# Patient Record
Sex: Male | Born: 1955 | Race: White | Hispanic: No | Smoking: Current every day smoker
Health system: Southern US, Community
[De-identification: ages and names within clinical notes are randomized; demographics above are authoritative.]

## PROBLEM LIST (undated history)

## (undated) DIAGNOSIS — M869 Osteomyelitis, unspecified: Secondary | ICD-10-CM

## (undated) DIAGNOSIS — I2510 Atherosclerotic heart disease of native coronary artery without angina pectoris: Secondary | ICD-10-CM

## (undated) DIAGNOSIS — I10 Essential (primary) hypertension: Secondary | ICD-10-CM

## (undated) DIAGNOSIS — K219 Gastro-esophageal reflux disease without esophagitis: Secondary | ICD-10-CM

## (undated) DIAGNOSIS — E119 Type 2 diabetes mellitus without complications: Secondary | ICD-10-CM

## (undated) DIAGNOSIS — E785 Hyperlipidemia, unspecified: Secondary | ICD-10-CM

## (undated) HISTORY — PX: OTHER SURGICAL HISTORY: SHX169

## (undated) HISTORY — PX: CARDIAC CATHETERIZATION: SHX172

## (undated) HISTORY — PX: BELOW KNEE LEG AMPUTATION: SUR23

## (undated) HISTORY — PX: TONSILLECTOMY: SUR1361

## (undated) HISTORY — PX: APPENDECTOMY: SHX54

---

## 2003-11-27 HISTORY — PX: CORONARY STENT INTERVENTION: CATH118234

## 2015-08-27 DEATH — deceased

## 2015-12-28 ENCOUNTER — Inpatient Hospital Stay (HOSPITAL_COMMUNITY): Payer: Medicaid Other

## 2015-12-28 ENCOUNTER — Encounter (HOSPITAL_COMMUNITY): Payer: Self-pay | Admitting: Emergency Medicine

## 2015-12-28 ENCOUNTER — Inpatient Hospital Stay (HOSPITAL_COMMUNITY)
Admission: EM | Admit: 2015-12-28 | Discharge: 2015-12-31 | DRG: 240 | Disposition: A | Discharge status: Home / Self Care | Payer: Medicaid Other | Attending: Family Medicine | Admitting: Family Medicine

## 2015-12-28 DIAGNOSIS — T148XXA Other injury of unspecified body region, initial encounter: Secondary | ICD-10-CM

## 2015-12-28 DIAGNOSIS — M869 Osteomyelitis, unspecified: Secondary | ICD-10-CM | POA: Diagnosis present

## 2015-12-28 DIAGNOSIS — Z95820 Peripheral vascular angioplasty status with implants and grafts: Secondary | ICD-10-CM

## 2015-12-28 DIAGNOSIS — E785 Hyperlipidemia, unspecified: Secondary | ICD-10-CM | POA: Diagnosis present

## 2015-12-28 DIAGNOSIS — Z86718 Personal history of other venous thrombosis and embolism: Secondary | ICD-10-CM | POA: Diagnosis not present

## 2015-12-28 DIAGNOSIS — T8149XA Infection following a procedure, other surgical site, initial encounter: Secondary | ICD-10-CM | POA: Insufficient documentation

## 2015-12-28 DIAGNOSIS — E11621 Type 2 diabetes mellitus with foot ulcer: Secondary | ICD-10-CM | POA: Diagnosis present

## 2015-12-28 DIAGNOSIS — L0390 Cellulitis, unspecified: Secondary | ICD-10-CM

## 2015-12-28 DIAGNOSIS — F17210 Nicotine dependence, cigarettes, uncomplicated: Secondary | ICD-10-CM | POA: Diagnosis present

## 2015-12-28 DIAGNOSIS — T148 Other injury of unspecified body region: Secondary | ICD-10-CM | POA: Diagnosis not present

## 2015-12-28 DIAGNOSIS — L02612 Cutaneous abscess of left foot: Secondary | ICD-10-CM | POA: Diagnosis present

## 2015-12-28 DIAGNOSIS — L03116 Cellulitis of left lower limb: Secondary | ICD-10-CM | POA: Diagnosis present

## 2015-12-28 DIAGNOSIS — Z89411 Acquired absence of right great toe: Secondary | ICD-10-CM

## 2015-12-28 DIAGNOSIS — E1165 Type 2 diabetes mellitus with hyperglycemia: Secondary | ICD-10-CM | POA: Diagnosis present

## 2015-12-28 DIAGNOSIS — Z880 Allergy status to penicillin: Secondary | ICD-10-CM

## 2015-12-28 DIAGNOSIS — L089 Local infection of the skin and subcutaneous tissue, unspecified: Secondary | ICD-10-CM

## 2015-12-28 DIAGNOSIS — R609 Edema, unspecified: Secondary | ICD-10-CM | POA: Diagnosis not present

## 2015-12-28 DIAGNOSIS — L97524 Non-pressure chronic ulcer of other part of left foot with necrosis of bone: Secondary | ICD-10-CM | POA: Diagnosis present

## 2015-12-28 DIAGNOSIS — I10 Essential (primary) hypertension: Secondary | ICD-10-CM | POA: Diagnosis present

## 2015-12-28 DIAGNOSIS — L0291 Cutaneous abscess, unspecified: Secondary | ICD-10-CM

## 2015-12-28 DIAGNOSIS — E1169 Type 2 diabetes mellitus with other specified complication: Secondary | ICD-10-CM | POA: Diagnosis present

## 2015-12-28 DIAGNOSIS — E871 Hypo-osmolality and hyponatremia: Secondary | ICD-10-CM | POA: Diagnosis present

## 2015-12-28 DIAGNOSIS — Z794 Long term (current) use of insulin: Secondary | ICD-10-CM | POA: Diagnosis not present

## 2015-12-28 DIAGNOSIS — E1152 Type 2 diabetes mellitus with diabetic peripheral angiopathy with gangrene: Principal | ICD-10-CM | POA: Diagnosis present

## 2015-12-28 DIAGNOSIS — L97529 Non-pressure chronic ulcer of other part of left foot with unspecified severity: Secondary | ICD-10-CM | POA: Diagnosis present

## 2015-12-28 HISTORY — DX: Type 2 diabetes mellitus without complications: E11.9

## 2015-12-28 LAB — I-STAT CG4 LACTIC ACID, ED: Lactic Acid, Venous: 1.26 mmol/L (ref 0.5–2.0)

## 2015-12-28 LAB — CBC WITH DIFFERENTIAL/PLATELET
BASOS ABS: 0.1 10*3/uL (ref 0.0–0.1)
BASOS PCT: 0 %
EOS ABS: 0 10*3/uL (ref 0.0–0.7)
EOS PCT: 0 %
HCT: 42.3 % (ref 39.0–52.0)
Hemoglobin: 14.5 g/dL (ref 13.0–17.0)
Lymphocytes Relative: 11 %
Lymphs Abs: 1.9 10*3/uL (ref 0.7–4.0)
MCH: 29.4 pg (ref 26.0–34.0)
MCHC: 34.3 g/dL (ref 30.0–36.0)
MCV: 85.8 fL (ref 78.0–100.0)
MONO ABS: 1.4 10*3/uL — AB (ref 0.1–1.0)
Monocytes Relative: 8 %
Neutro Abs: 14.3 10*3/uL — ABNORMAL HIGH (ref 1.7–7.7)
Neutrophils Relative %: 81 %
PLATELETS: 194 10*3/uL (ref 150–400)
RBC: 4.93 MIL/uL (ref 4.22–5.81)
RDW: 13.4 % (ref 11.5–15.5)
WBC: 17.7 10*3/uL — AB (ref 4.0–10.5)

## 2015-12-28 LAB — BASIC METABOLIC PANEL
ANION GAP: 11 (ref 5–15)
BUN: 14 mg/dL (ref 6–20)
CALCIUM: 8.7 mg/dL — AB (ref 8.9–10.3)
CO2: 27 mmol/L (ref 22–32)
Chloride: 91 mmol/L — ABNORMAL LOW (ref 101–111)
Creatinine, Ser: 0.86 mg/dL (ref 0.61–1.24)
GLUCOSE: 182 mg/dL — AB (ref 65–99)
Potassium: 4.7 mmol/L (ref 3.5–5.1)
SODIUM: 129 mmol/L — AB (ref 135–145)

## 2015-12-28 LAB — GLUCOSE, CAPILLARY: Glucose-Capillary: 173 mg/dL — ABNORMAL HIGH (ref 65–99)

## 2015-12-28 LAB — SEDIMENTATION RATE: SED RATE: 85 mm/h — AB (ref 0–16)

## 2015-12-28 LAB — OSMOLALITY: Osmolality: 279 mOsm/kg (ref 275–295)

## 2015-12-28 LAB — C-REACTIVE PROTEIN: CRP: 27.2 mg/dL — ABNORMAL HIGH (ref <1.0)

## 2015-12-28 MED ORDER — LISINOPRIL 5 MG PO TABS
5.0000 mg | ORAL_TABLET | Freq: Two times a day (BID) | ORAL | Status: DC
  Administered 2015-12-28 – 2015-12-31 (×5): 5 mg via ORAL
  Filled 2015-12-28 (×5): qty 1

## 2015-12-28 MED ORDER — HYDROCODONE-ACETAMINOPHEN 10-325 MG PO TABS
1.0000 | ORAL_TABLET | Freq: Four times a day (QID) | ORAL | Status: DC | PRN
  Administered 2015-12-28 – 2015-12-30 (×7): 1 via ORAL
  Filled 2015-12-28 (×7): qty 1

## 2015-12-28 MED ORDER — VANCOMYCIN HCL IN DEXTROSE 1-5 GM/200ML-% IV SOLN
1000.0000 mg | Freq: Three times a day (TID) | INTRAVENOUS | Status: DC
  Administered 2015-12-28 – 2015-12-31 (×8): 1000 mg via INTRAVENOUS
  Filled 2015-12-28 (×10): qty 200

## 2015-12-28 MED ORDER — PREGABALIN 100 MG PO CAPS
100.0000 mg | ORAL_CAPSULE | Freq: Three times a day (TID) | ORAL | Status: DC
  Administered 2015-12-28 – 2015-12-31 (×8): 100 mg via ORAL
  Filled 2015-12-28 (×8): qty 1

## 2015-12-28 MED ORDER — HEPARIN SODIUM (PORCINE) 5000 UNIT/ML IJ SOLN
5000.0000 [IU] | Freq: Three times a day (TID) | INTRAMUSCULAR | Status: DC
  Administered 2015-12-28 – 2015-12-31 (×6): 5000 [IU] via SUBCUTANEOUS
  Filled 2015-12-28 (×6): qty 1

## 2015-12-28 MED ORDER — INSULIN ASPART 100 UNIT/ML ~~LOC~~ SOLN
0.0000 [IU] | Freq: Three times a day (TID) | SUBCUTANEOUS | Status: DC
  Administered 2015-12-29: 3 [IU] via SUBCUTANEOUS
  Administered 2015-12-29: 2 [IU] via SUBCUTANEOUS
  Administered 2015-12-29: 3 [IU] via SUBCUTANEOUS
  Administered 2015-12-30: 2 [IU] via SUBCUTANEOUS
  Administered 2015-12-30: 3 [IU] via SUBCUTANEOUS

## 2015-12-28 MED ORDER — NICOTINE 14 MG/24HR TD PT24
14.0000 mg | MEDICATED_PATCH | Freq: Every day | TRANSDERMAL | Status: DC
  Administered 2015-12-28 – 2015-12-30 (×3): 14 mg via TRANSDERMAL
  Filled 2015-12-28 (×4): qty 1

## 2015-12-28 MED ORDER — PANTOPRAZOLE SODIUM 40 MG PO TBEC
40.0000 mg | DELAYED_RELEASE_TABLET | Freq: Every day | ORAL | Status: DC
  Administered 2015-12-29 – 2015-12-31 (×3): 40 mg via ORAL
  Filled 2015-12-28 (×3): qty 1

## 2015-12-28 MED ORDER — ATORVASTATIN CALCIUM 40 MG PO TABS
40.0000 mg | ORAL_TABLET | Freq: Every day | ORAL | Status: DC
Start: 2015-12-29 — End: 2015-12-31
  Administered 2015-12-29 – 2015-12-31 (×3): 40 mg via ORAL
  Filled 2015-12-28 (×3): qty 1

## 2015-12-28 MED ORDER — HYDROCODONE-ACETAMINOPHEN 5-325 MG PO TABS
1.0000 | ORAL_TABLET | Freq: Four times a day (QID) | ORAL | Status: DC | PRN
Start: 2015-12-28 — End: 2015-12-28

## 2015-12-28 MED ORDER — SODIUM CHLORIDE 0.9 % IV SOLN
INTRAVENOUS | Status: AC
  Administered 2015-12-28: 23:00:00 via INTRAVENOUS
  Filled 2015-12-28: qty 1000

## 2015-12-28 MED ORDER — CARVEDILOL 6.25 MG PO TABS
6.2500 mg | ORAL_TABLET | Freq: Two times a day (BID) | ORAL | Status: DC
  Administered 2015-12-29 – 2015-12-31 (×5): 6.25 mg via ORAL
  Filled 2015-12-28 (×5): qty 1

## 2015-12-28 MED ORDER — CLOPIDOGREL BISULFATE 75 MG PO TABS: 75.0000 mg | ORAL_TABLET | Freq: Every day | ORAL | Status: DC

## 2015-12-28 MED ORDER — VANCOMYCIN HCL IN DEXTROSE 1-5 GM/200ML-% IV SOLN: 1000.0000 mg | Freq: Once | INTRAVENOUS | Status: DC

## 2015-12-28 MED ORDER — GADOBENATE DIMEGLUMINE 529 MG/ML IV SOLN
20.0000 mL | Freq: Once | INTRAVENOUS | Status: AC | PRN
  Administered 2015-12-28: 20 mL via INTRAVENOUS

## 2015-12-28 MED ORDER — INSULIN GLARGINE 100 UNIT/ML ~~LOC~~ SOLN
15.0000 [IU] | Freq: Every day | SUBCUTANEOUS | Status: DC
Start: 2015-12-28 — End: 2015-12-31
  Administered 2015-12-28 – 2015-12-30 (×3): 15 [IU] via SUBCUTANEOUS
  Filled 2015-12-28 (×4): qty 0.15

## 2015-12-28 NOTE — ED Notes (Addendum)
Pt taken to MRI and then to be transported to [facility].

## 2015-12-28 NOTE — Consult Note (Signed)
Pharmacy Antibiotic Note  Antonio [name]. is a 60 y.o. male admitted on 12/28/2015 with cellulitis.  Pharmacy has been consulted for vancomycin dosing.  Was started on Doxycycline on 1/27 without improvement. Seen at [facility] today for debridement of foot ulcer.  MRI with osteomyelitis in proximal phalanx of third toe of left foot.  Plan: Vanc IV 1g x1 F/u weight to enter maintenance dose Monitor renal function, cultures, VT prn, clinical progression    Temp (24hrs), Avg:100.2 F (37.9 C), Min:100.2 F (37.9 C), Max:100.2 F (37.9 C)   Recent Labs Lab 12/28/15 1253 12/28/15 1647 12/28/15 1654  WBC 17.7*  --   --   CREATININE  --  0.86  --   LATICACIDVEN  --   --  1.26    CrCl cannot be calculated (Unknown ideal weight.).    Allergies  Allergen Reactions  . Penicillins Other (See Comments)    Antimicrobials this admission: Vanc 2/1 >>   Dose adjustments this admission: n/a  Microbiology results: 2/1 BCx: sent  Thank you for allowing pharmacy to be a part of this patient's care.  Greggory Stallion, PharmD Clinical Pharmacy Resident Pager # [phone] 12/28/2015 9:58 PM

## 2015-12-28 NOTE — Consult Note (Signed)
Pharmacy Antibiotic Note  Antonio [name]. is a 60 y.o. male admitted on 12/28/2015 with cellulitis.  Pharmacy has been consulted for vancomycin dosing.  Was started on Doxycycline on 1/27 without improvement. Seen at [facility] today for debridement of foot ulcer.  MRI with osteomyelitis in proximal phalanx of third toe of left foot.  Plan: Vanc 1g IV q8h Monitor renal function, cultures, VT prn, clinical progression  Height: 6' (182.9 cm) Weight: 219 lb (99.338 kg) IBW/kg (Calculated) : 77.6  Temp (24hrs), Avg:100.3 F (37.9 C), Min:100.2 F (37.9 C), Max:100.4 F (38 C)   Recent Labs Lab 12/28/15 1253 12/28/15 1647 12/28/15 1654  WBC 17.7*  --   --   CREATININE  --  0.86  --   LATICACIDVEN  --   --  1.26    Estimated Creatinine Clearance: 115.7 mL/min (by C-G formula based on Cr of 0.86).    Allergies  Allergen Reactions  . Penicillins Other (See Comments)    Antimicrobials this admission: Vanc 2/1 >>   Dose adjustments this admission: n/a  Microbiology results: 2/1 BCx: sent  Arlean Hopping. Newman Pies, PharmD, BCPS Clinical Pharmacist Pager [phone]  12/28/2015 10:15 PM

## 2015-12-28 NOTE — ED Notes (Signed)
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 Pt alert x4. NAD at this time.

## 2015-12-28 NOTE — H&P (Signed)
[facility] Admission History and Physical Service Pager: [phone]  Patient name: Antonio [name]. Medical record number: [id number] Date of birth: [birth year] Age: 60 y.o. Gender: male  Primary Care Provider: No PCP Per Patient Consultants: WOC Code Status: Full  Chief Complaint: Cellulitis  Assessment and Plan: Antonio [name]. is a 60 y.o. male presenting with left lower extremity cellulitis with concern for possible osteomyelitis. PMH is significant for Diabetes  # Cellulitis/Osteomyelitis: Was started on Doxycycline on 1/27 without improvement. Lactic Acid 1.26 at admission. WBC 17.7. Afebrile with stable vitals. qSOFA score of 0. MRI with osteomyelitis in proximal phalanx of third toe of left foot with hypoperfusion of soft tissues along ball of foot into third, fourth, and fifth toes concerning for necrosis. - Admit to [place], Gwendlyn Deutscher attending - [place] consult - Follow up blood cultures - Follow up CRP - Orthopedics consulted. Appreciate recommendations.  - Vancomycin per Pharmacy (2/1>>)  # Hyponatremia: Sodium 129 at admission. Corrects to 130 with hyperglycemia - Follow up serum osmolality, urine osmolality, urine sodium - NS'@100'  x12hr - Continue to monitor. Follow up BMP in am  # Diabetes: Home medications include Glipizide 54m and Lantus 30 units at bedtime - Follow up A1C - Lantus 15units. Titrate up as indicated during hospitalization - Sensitive Insulin Sliding Scale  # Hypertension: Home medications include Coreg 6.272mBID - Continue home medication.  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 Home medications include Plavix 7580m US [place] extremity - Holding Plavix due to possible surgery - Heparin SQ  # Tobacco Abuse: Smokes 2ppd - Nicotine Patch - Smoking cessation counseling during hospitalization  # Hyperlipidemia: Home medication includes Atorvastatin 66m69mContinue home  medication  FEN/GI: Protonix. NS '@100x12hr' . Carb Modified Diet (Will need to be NPO at midnight prior to surgery) Prophylaxis: SQ Heparin  Disposition: Pending improvement with antibiotics and possibly surgery  History of Present Illness:  Antonio [name] a 59 y75. male presenting with left lower extremity cellulitis. Initially presented to Wound Care, who noted significant erythema, warmth, and swelling of left foot with chronic ulcers. Stepped on nail 6 months ago, resulting in abscess which was drained and in process of healing. One week ago, area acutely worsened and abscess returned. Debrided by wound care on 1/27 and initiated on Doxycycline. Redness, swelling, edema, and pain has continued to worsen. Reports chills. Denies nausea, vomiting, diarrhea, rashes elsewhere. Reports history of Diabetes, Hypertension, Hyperlipidemia, Tobacco Abuse, and DVT (although knew very little about his history of DVT).  Review Of Systems: Per HPI  Otherwise the remainder of the systems were negative.  Patient Active Problem List   Diagnosis Date Noted  . Cellulitis 12/28/2015    Past Medical History: Past Medical History  Diagnosis Date  . [facility]  Past Surgical History: Past Surgical History  Procedure Laterality Date  . Cardiac catheterization    . Great toe amputation right.      Social History: Social History  Substance Use Topics  . Smoking status: Current Every Day Smoker -- 2.00 packs/day    Types: Cigarettes  . Smokeless tobacco: None  . Alcohol Use: No   Please also refer to relevant sections of EMR.  Family History: Family History Not Contributory to Patient Problem  Allergies and Medications: Allergies  Allergen Reactions  . Penicillins Other (See Comments)   No current facility-administered medications on file prior to encounter.   No  current outpatient prescriptions on file prior to encounter.    Objective: BP  107/74 mmHg  Pulse 85  Temp(Src) 100.2 F (37.9 C) (Oral)  Resp 16  SpO2 92% Exam: General: 60yo male resting comfortably in no apparent distress Eyes: PERRLA, white sclera ENTM: Dry mucous membranes Neck: Supple, No lymphadenopathy Cardiovascular: S1 and S2 noted. No murmurs. Regular rate and rhythm. Respiratory: Clear to auscultation bilaterally. No wheezes. No increased work of breathing. Abdomen: Bowel sounds noted. Nontender. Nondistended. MSK: Edema of left lower extremity noted. Limited ROM of left ankle due to edema and pain. Skin: Erythema and warmth noted of left lower extremity with two ulcers noted on base of foot and one ulcer noted between first and second digit. Discoloration of 4th digit noted. Refer to pictures below. Neuro: No focal deficits. Psych: Normal Affect. Alert.      Labs and Imaging: CBC BMET   Recent Labs Lab 12/28/15 1253  WBC 17.7*  HGB 14.5  HCT 42.3  PLT 194    Recent Labs Lab 12/28/15 1647  NA 129*  K 4.7  CL 91*  CO2 27  BUN 14  CREATININE 0.86  GLUCOSE 182*  CALCIUM 8.7*    - Lactic Acid 1.26 - ESR 85  Mr Foot Left W Wo Contrast  12/28/2015  CLINICAL DATA:  Chronic left foot wound, stepped on a nail 6 months ago, had an abscess which drained and initially appear to heal well but 1 week ago the area worsened and abscess return. Wound care debridement on 12/22/2014. Foot found to be very erythematous and swollen today at followup. Diabetes. EXAM: MRI OF THE LEFT FOREFOOT WITHOUT AND WITH CONTRAST TECHNIQUE: Multiplanar, multisequence MR imaging was performed both before and after administration of intravenous contrast. CONTRAST:  7m MULTIHANCE GADOBENATE DIMEGLUMINE 529 MG/ML IV SOLN COMPARISON:  12/22/2015 FINDINGS: Osteomyelitis protocol MRI of the foot was obtained, to include the entire foot and ankle. This protocol uses a large field of view to cover the entire foot and ankle, and is suitable for assessing bony structures for  osteomyelitis. Due to the large field of view and imaging plane choice, this protocol is less sensitive for assessing small structures such as ligamentous structures of the foot and ankle, compared to a dedicated forefoot or dedicated hindfoot exam. Although inversion recovery weighted images raise the possibility of some edema signal in the distal phalanx and proximal phalanx of the great toe, lack of low T1 signal in these bones suggests that this is probably due to field heterogeneity rather than osteomyelitis. There is potentially some low-level osteomyelitis, however, in the proximal phalanx of the third toe where there is some reduced T1 signal and also increased T2 signal, but only questionable enhancement. There is abnormal hypoenhancement in the soft tissues along the ball of the foot extending from the second MTP joint all the way over to the fifth toe, and tracking somewhat into the third, fourth, and fifth toes as shown on images 13 through 2 of series 6. Appearance concerning for tissue necrosis given the hypoperfusion. A small amount of this extends over to the base of the great toe medially. Although suspicious for potentially necrotic tissue, we do not demonstrate a drainable abscess. Subcutaneous edema and enhancement tracking along the dorsum of the foot compatible cellulitis. Suspected blistering tracking around the dorsum of the fourth toe proximally. Lisfranc ligament intact. Midfoot degenerative arthropathy. Abnormal edema and enhancement tracks in the plantar musculature of the foot. Thickened medial band of the plantar fascia.  IMPRESSION: 1. Suspected osteomyelitis in the proximal phalanx of the third toe, with extensive hypoperfusion of soft tissues tracking along the ball of the foot laterally and into the third, fourth, and fifth toes, concerning for necrosis. There is some questionable edema in some of the other phalanges but not borne out with low T1 signal on the T1 weighted images. 2.  No drainable abscess. 3. Plantar fasciitis. 4. Cellulitis in the dorsum of the foot with probable myositis tracking along the plantar musculature of the foot. Electronically Signed   By: Van Clines M.D.   On: 12/28/2015 20:25    Lorna Few, DO 12/28/2015, 5:43 PM PGY-2, [place] pager: [phone], text pages welcome

## 2015-12-28 NOTE — ED Provider Notes (Signed)
CSN: [id number]     Arrival date & time 12/28/15  1151 History   First MD Initiated Contact with Patient 12/28/15 1538     Chief Complaint  Patient presents with  . Foot Ulcer     (Consider location/radiation/quality/duration/timing/severity/associated sxs/prior Treatment) The history is provided by the patient and medical records. No language interpreter was used.    Antonio [name]. is a 60 y.o. male  with a PMH of DM, chronic left foot wound who presents to the Emergency Department sent from wound care for further evaluation of left foot. Pt. States he stepped on a nail 6 months ago - had an abscess which was drained and area was healing up well. Approx. 1 week ago, area acutely worsened and abscess returned. He was seen by wound care for debridement on 1/27 and started on doxycyline. Went to follow up appointment today where foot was found to be extremely erythematous and swollen.   Past Medical History  Diagnosis Date  . Diabetes mellitus without complication [facility])    Past Surgical History  Procedure Laterality Date  . Cardiac catheterization    . Great toe amputation right.     No family history on file. Social History  Substance Use Topics  . Smoking status: Current Every Day Smoker -- 2.00 packs/day    Types: Cigarettes  . Smokeless tobacco: None  . Alcohol Use: No    Review of Systems  Constitutional: Negative for fever and chills.  HENT: Negative for congestion.   Eyes: Negative for visual disturbance.  Respiratory: Negative for cough, shortness of breath and wheezing.   Cardiovascular: Negative for chest pain and palpitations.  Gastrointestinal: Negative for nausea, vomiting and abdominal pain.  Genitourinary: Negative for dysuria.  Musculoskeletal: Negative for back pain and neck pain.  Skin: Positive for wound.  Neurological: Negative for dizziness, weakness and headaches.      Allergies  Penicillins  Home Medications   Prior to Admission  medications   Medication Sig Start Date End Date Taking? Authorizing Provider  pregabalin (LYRICA) 100 MG capsule Take 100 mg by mouth 3 (three) times daily.   Yes Historical Provider, MD   BP 107/74 mmHg  Pulse 85  Temp(Src) 100.2 F (37.9 C) (Oral)  Resp 16  SpO2 92% Physical Exam  Constitutional: He is oriented to person, place, and time. He appears well-developed and well-nourished.  Alert and in no acute distress  HENT:  Head: Normocephalic and atraumatic.  Cardiovascular: Normal rate, regular rhythm, normal heart sounds and intact distal pulses.  Exam reveals no gallop and no friction rub.   No murmur heard. Pulmonary/Chest: Effort normal and breath sounds normal. No respiratory distress. He has no wheezes. He has no rales. He exhibits no tenderness.  Abdominal: Soft. Bowel sounds are normal. He exhibits no distension and no mass. There is no tenderness. There is no rebound and no guarding.  Musculoskeletal: He exhibits no edema.  Neurological: He is alert and oriented to person, place, and time.  Skin: Skin is warm and dry.  Two open wounds on plantar surface of left foot.  Foot TTP, erythematous and swollen up to ankle Pulse present. Motor and sensation intact.   Psychiatric: He has a normal mood and affect. His behavior is normal. Judgment and thought content normal.  Nursing note and vitals reviewed.   ED Course  Procedures (including critical care time) Labs Review Labs Reviewed  CBC WITH DIFFERENTIAL/PLATELET - Abnormal; Notable for the following:    WBC 17.7 (*)  Neutro Abs 14.3 (*)    Monocytes Absolute 1.4 (*)    All other components within normal limits  BASIC METABOLIC PANEL - Abnormal; Notable for the following:    Sodium 129 (*)    Chloride 91 (*)    Glucose, Bld 182 (*)    Calcium 8.7 (*)    All other components within normal limits  CULTURE, BLOOD (ROUTINE X 2)  CULTURE, BLOOD (ROUTINE X 2)  SEDIMENTATION RATE  I-STAT CG4 LACTIC ACID, ED     Imaging Review No results found. I have personally reviewed and evaluated these images and lab results as part of my medical decision-making.   EKG Interpretation None      MDM   Final diagnoses:  Wound infection [facility])   Antonio [name]. presents, sent from wound care, for worsening left foot infection. Debridement on 1/27 and started on doxy at that time. On exam, foot is erythematous and swollen - c/w cellulitis. White count of 17.7, temp of 100.2 -- will send blood cx's. Concern for osteo -- will obtain MRI, sed rate.   A&P: Cellulitis  - Consult to family medicine who will admit patient for further evaluation and management. MRI and sed rate pending at time of admission.   Patient discussed with Dr. Preston Fleeting who agrees with treatment plan.   [facility] Naomi Fitton, PA-C 12/28/15 1749  Dione Booze, MD 12/29/15 [place] Manners

## 2015-12-29 ENCOUNTER — Ambulatory Visit (HOSPITAL_COMMUNITY): Payer: Medicaid Other

## 2015-12-29 ENCOUNTER — Other Ambulatory Visit (HOSPITAL_COMMUNITY): Payer: Self-pay | Admitting: Orthopedic Surgery

## 2015-12-29 ENCOUNTER — Telehealth: Payer: Self-pay | Admitting: General Practice

## 2015-12-29 DIAGNOSIS — L0390 Cellulitis, unspecified: Secondary | ICD-10-CM

## 2015-12-29 DIAGNOSIS — T8149XA Infection following a procedure, other surgical site, initial encounter: Secondary | ICD-10-CM | POA: Insufficient documentation

## 2015-12-29 DIAGNOSIS — R609 Edema, unspecified: Secondary | ICD-10-CM | POA: Insufficient documentation

## 2015-12-29 DIAGNOSIS — T148XXA Other injury of unspecified body region, initial encounter: Secondary | ICD-10-CM

## 2015-12-29 DIAGNOSIS — L089 Local infection of the skin and subcutaneous tissue, unspecified: Secondary | ICD-10-CM | POA: Insufficient documentation

## 2015-12-29 DIAGNOSIS — L0291 Cutaneous abscess, unspecified: Secondary | ICD-10-CM | POA: Insufficient documentation

## 2015-12-29 LAB — CBC
HEMATOCRIT: 37.4 % — AB (ref 39.0–52.0)
HEMOGLOBIN: 12.7 g/dL — AB (ref 13.0–17.0)
MCH: 29 pg (ref 26.0–34.0)
MCHC: 34 g/dL (ref 30.0–36.0)
MCV: 85.4 fL (ref 78.0–100.0)
Platelets: 171 10*3/uL (ref 150–400)
RBC: 4.38 MIL/uL (ref 4.22–5.81)
RDW: 13.4 % (ref 11.5–15.5)
WBC: 13.5 10*3/uL — ABNORMAL HIGH (ref 4.0–10.5)

## 2015-12-29 LAB — GLUCOSE, CAPILLARY
GLUCOSE-CAPILLARY: 181 mg/dL — AB (ref 65–99)
GLUCOSE-CAPILLARY: 233 mg/dL — AB (ref 65–99)
Glucose-Capillary: 217 mg/dL — ABNORMAL HIGH (ref 65–99)
Glucose-Capillary: 226 mg/dL — ABNORMAL HIGH (ref 65–99)
Glucose-Capillary: 233 mg/dL — ABNORMAL HIGH (ref 65–99)

## 2015-12-29 LAB — BASIC METABOLIC PANEL
ANION GAP: 10 (ref 5–15)
BUN: 10 mg/dL (ref 6–20)
CHLORIDE: 94 mmol/L — AB (ref 101–111)
CO2: 26 mmol/L (ref 22–32)
Calcium: 8.4 mg/dL — ABNORMAL LOW (ref 8.9–10.3)
Creatinine, Ser: 0.73 mg/dL (ref 0.61–1.24)
GFR calc Af Amer: >60 mL/min (ref >60)
GFR calc non Af Amer: >60 mL/min (ref >60)
GLUCOSE: 179 mg/dL — AB (ref 65–99)
POTASSIUM: 4.4 mmol/L (ref 3.5–5.1)
Sodium: 130 mmol/L — ABNORMAL LOW (ref 135–145)

## 2015-12-29 LAB — OSMOLALITY, URINE: Osmolality, Ur: 753 mOsm/kg (ref 300–900)

## 2015-12-29 LAB — SODIUM, URINE, RANDOM: SODIUM UR: 18 mmol/L

## 2015-12-29 NOTE — Telephone Encounter (Signed)
Called brittany back and advised her to page the inpatient since patient is still in the hospital.  [facility]

## 2015-12-29 NOTE — Consult Note (Signed)
Reason for Consult: Abscess cellulitis left foot Referring Physician: Dr. Verlin Fester [facility]. is an 60 y.o. male.  HPI: Patient is a 60 year old gentleman who reports uncontrolled diabetes who is status post revascularization for both lower extremities with stenting on the left angioplasty on the right. Patient recently underwent debridement of 2 foot ulcers and he states the cellulitis developed after debridement of the ulcers left foot.  Past Medical History  Diagnosis Date  . Diabetes mellitus without complication [facility])     Past Surgical History  Procedure Laterality Date  . Cardiac catheterization    . Great toe amputation right.      No family history on file.  Social History:  reports that he has been smoking Cigarettes.  He has been smoking about 2.00 packs per day. He does not have any smokeless tobacco history on file. He reports that he does not drink alcohol or use illicit drugs.  Allergies:  Allergies  Allergen Reactions  . Penicillins Other (See Comments)    Medications: I have reviewed the patient's current medications.  Results for orders placed or performed during the hospital encounter of 12/28/15 (from the past 48 hour(s))  CBC with Differential     Status: Abnormal   Collection Time: 12/28/15 12:53 PM  Result Value Ref Range   WBC 17.7 (H) 4.0 - 10.5 K/uL   RBC 4.93 4.22 - 5.81 MIL/uL   Hemoglobin 14.5 13.0 - 17.0 g/dL   HCT 42.3 39.0 - 52.0 %   MCV 85.8 78.0 - 100.0 fL   MCH 29.4 26.0 - 34.0 pg   MCHC 34.3 30.0 - 36.0 g/dL   RDW 13.4 11.5 - 15.5 %   Platelets 194 150 - 400 K/uL   Neutrophils Relative % 81 %   Neutro Abs 14.3 (H) 1.7 - 7.7 K/uL   Lymphocytes Relative 11 %   Lymphs Abs 1.9 0.7 - 4.0 K/uL   Monocytes Relative 8 %   Monocytes Absolute 1.4 (H) 0.1 - 1.0 K/uL   Eosinophils Relative 0 %   Eosinophils Absolute 0.0 0.0 - 0.7 K/uL   Basophils Relative 0 %   Basophils Absolute 0.1 0.0 - 0.1 K/uL  Basic metabolic panel      Status: Abnormal   Collection Time: 12/28/15  4:47 PM  Result Value Ref Range   Sodium 129 (L) 135 - 145 mmol/L   Potassium 4.7 3.5 - 5.1 mmol/L    Comment: SLIGHT HEMOLYSIS   Chloride 91 (L) 101 - 111 mmol/L   CO2 27 22 - 32 mmol/L   Glucose, Bld 182 (H) 65 - 99 mg/dL   BUN 14 6 - 20 mg/dL   Creatinine, Ser 0.86 0.61 - 1.24 mg/dL   Calcium 8.7 (L) 8.9 - 10.3 mg/dL   GFR calc non Af Amer >60 >60 mL/min   GFR calc Af Amer >60 >60 mL/min    Comment: (NOTE) The eGFR has been calculated using the CKD EPI equation. This calculation has not been validated in all clinical situations. eGFR's persistently <60 mL/min signify possible Chronic Kidney Disease.    Anion gap 11 5 - 15  Sedimentation rate     Status: Abnormal   Collection Time: 12/28/15  4:47 PM  Result Value Ref Range   Sed Rate 85 (H) 0 - 16 mm/hr  I-Stat CG4 Lactic Acid, ED     Status: None   Collection Time: 12/28/15  4:54 PM  Result Value Ref Range   Lactic Acid, Venous  1.26 0.5 - 2.0 mmol/L  C-reactive protein     Status: Abnormal   Collection Time: 12/28/15  8:50 PM  Result Value Ref Range   CRP 27.2 (H) <1.0 mg/dL  Osmolality     Status: None   Collection Time: 12/28/15  8:50 PM  Result Value Ref Range   Osmolality 279 275 - 295 mOsm/kg  Glucose, capillary     Status: Abnormal   Collection Time: 12/28/15 10:41 PM  Result Value Ref Range   Glucose-Capillary 173 (H) 65 - 99 mg/dL  Osmolality, urine     Status: None   Collection Time: 12/29/15  3:34 AM  Result Value Ref Range   Osmolality, Ur 753 300 - 900 mOsm/kg  Sodium, urine, random     Status: None   Collection Time: 12/29/15  3:34 AM  Result Value Ref Range   Sodium, Ur 18 mmol/L    Mr Foot Left W Wo Contrast  12/28/2015  CLINICAL DATA:  Chronic left foot wound, stepped on a nail 6 months ago, had an abscess which drained and initially appear to heal well but 1 week ago the area worsened and abscess return. Wound care debridement on 12/22/2014. Foot  found to be very erythematous and swollen today at followup. Diabetes. EXAM: MRI OF THE LEFT FOREFOOT WITHOUT AND WITH CONTRAST TECHNIQUE: Multiplanar, multisequence MR imaging was performed both before and after administration of intravenous contrast. CONTRAST:  1m MULTIHANCE GADOBENATE DIMEGLUMINE 529 MG/ML IV SOLN COMPARISON:  12/22/2015 FINDINGS: Osteomyelitis protocol MRI of the foot was obtained, to include the entire foot and ankle. This protocol uses a large field of view to cover the entire foot and ankle, and is suitable for assessing bony structures for osteomyelitis. Due to the large field of view and imaging plane choice, this protocol is less sensitive for assessing small structures such as ligamentous structures of the foot and ankle, compared to a dedicated forefoot or dedicated hindfoot exam. Although inversion recovery weighted images raise the possibility of some edema signal in the distal phalanx and proximal phalanx of the great toe, lack of low T1 signal in these bones suggests that this is probably due to field heterogeneity rather than osteomyelitis. There is potentially some low-level osteomyelitis, however, in the proximal phalanx of the third toe where there is some reduced T1 signal and also increased T2 signal, but only questionable enhancement. There is abnormal hypoenhancement in the soft tissues along the ball of the foot extending from the second MTP joint all the way over to the fifth toe, and tracking somewhat into the third, fourth, and fifth toes as shown on images 13 through 2 of series 6. Appearance concerning for tissue necrosis given the hypoperfusion. A small amount of this extends over to the base of the great toe medially. Although suspicious for potentially necrotic tissue, we do not demonstrate a drainable abscess. Subcutaneous edema and enhancement tracking along the dorsum of the foot compatible cellulitis. Suspected blistering tracking around the dorsum of the  fourth toe proximally. Lisfranc ligament intact. Midfoot degenerative arthropathy. Abnormal edema and enhancement tracks in the plantar musculature of the foot. Thickened medial band of the plantar fascia. IMPRESSION: 1. Suspected osteomyelitis in the proximal phalanx of the third toe, with extensive hypoperfusion of soft tissues tracking along the ball of the foot laterally and into the third, fourth, and fifth toes, concerning for necrosis. There is some questionable edema in some of the other phalanges but not borne out with low T1 signal on the  T1 weighted images. 2. No drainable abscess. 3. Plantar fasciitis. 4. Cellulitis in the dorsum of the foot with probable myositis tracking along the plantar musculature of the foot. Electronically Signed   By: Van Clines M.D.   On: 12/28/2015 20:25    Review of Systems  All other systems reviewed and are negative.  Blood pressure 117/68, pulse 88, temperature 100.2 F (37.9 C), temperature source Oral, resp. rate 16, height 6' (1.829 m), weight 99.338 kg (219 lb), SpO2 92 %. Physical Exam On examination patient has good hair growth in both legs. He does not have a palpable dorsalis pedis pulse bilaterally. Patient status post amputation of the right great toe. Left foot shows cellulitis involving almost the entire dorsum of the foot. The forefoot is tender to palpation. There is no purulent drainage from the ulcers. Review of the MRI scan does show what appears to be a large space of nonviable tissue on the plantar forefoot Assessment/Plan: Assessment: Uncontrolled diabetes with peripheral vascular disease cellulitis and a large possible abscess plantar aspect of the left forefoot.  Plan: I will order ankle-brachial indices. Plan for surgical intervention tomorrow with debridement of the plantar area of nonviable tissue left foot  DUDA,MARCUS V 12/29/2015, 6:52 AM

## 2015-12-29 NOTE — Progress Notes (Signed)
VASCULAR LAB PRELIMINARY  ARTERIAL  ABI completed:    RIGHT    LEFT    PRESSURE WAVEFORM  PRESSURE WAVEFORM  BRACHIAL 107 Triphasic BRACHIAL 113 Triphasic  DP 88 Biphasic DP 88 Monophasic  PT 80 Biphasic PT 83 Monophasic    RIGHT LEFT  ABI 0.78 0.78   ABIs indicate a moderate reduction in arterial flow bilaterally at rest  Sonakshi Rolland, RVS 12/29/2015, 12:41 PM

## 2015-12-29 NOTE — Progress Notes (Signed)
VASCULAR LAB PRELIMINARY  PRELIMINARY  PRELIMINARY  PRELIMINARY  Left lower extremity venous duplex completed.    Preliminary report:  Left:  No evidence of DVT, superficial thrombosis, or Baker's cyst.  Cyanne Delmar, RVS 12/29/2015, 12:00 PM

## 2015-12-29 NOTE — Consult Note (Addendum)
WOC consult for left foot requested prior to ortho service involvement. Dr Lajoyce Corners is now following for plan of care and plans to take pt to the OR, according to the EMR.  Please refer to their team for further questions. Please re-consult if further assistance is needed.  Thank-you,  Cammie Mcgee MSN, RN, CWOCN, [place], CNS [phone]

## 2015-12-29 NOTE — Progress Notes (Signed)
RN informed from NT that patient is upset because RN told him that his next pain medication is due at 2300. Patient is only allowed to receive Norco 1 tablet every 6 hours. Last dose received at 1700. Nursing will continue to monitor.

## 2015-12-29 NOTE — Progress Notes (Signed)
Family Medicine Teaching Service Daily Progress Note Intern Pager: [phone]  Patient name: Antonio [name]. Medical record number: [id number] Date of birth: 1956/08/09 Age: 60 y.o. Gender: male  Primary Care Provider: No PCP Per Patient Consultants: Ortho Code Status: Full   Pt Overview and Major Events to Date:  2/1: Admit to FPTS  Assessment and Plan: Antonio [name]. is a 60 y.o. male presenting with left lower extremity cellulitis with concern for possible osteomyelitis. PMH is significant for Diabetes  Cellulitis/Osteomyelitis: Was started on Doxycycline on 1/27 without improvement. Lactic Acid 1.26 at admission. WBC 17.7>13.5. Afebrile with stable vitals. qSOFA score of 0. MRI with osteomyelitis in proximal phalanx of third toe of left foot with hypoperfusion of soft tissues along ball of foot into third, fourth, and fifth toes concerning for necrosis. CRP 27.2 - WOC consult - Follow up blood cultures - Vancomycin per Pharmacy (2/1>>) - NORCO 10-325 mg q6 PRN - Orthopedics consulted, appreciate recommendations  - ABI   - Likely surgery tomorrow; debridement of the plantar area. NPO after midnight  Hyponatremia: Sodium 129 at admission. Corrects to 130 with hyperglycemia - Follow up serum osmolality, urine osmolality, urine sodium - NS@100  x12hr - Continue to monitor. Follow up BMP in am  Diabetes: Home medications include Glipizide  and Lantus 30 units at bedtime - Follow up A1C - Lantus 15units. Titrate up as indicated during hospitalization - Sensitive Insulin Sliding Scale  Hypertension: Home medications include Coreg 6.25mg  BID - Continue home medication.  DVTs: History reported by patient. Home medications include Plavix  - [place] lower extremity - Holding Plavix due to possible surgery - Heparin SQ  Tobacco Abuse: Smokes 2ppd - Nicotine Patch - Smoking cessation counseling during hospitalization  Hyperlipidemia: Home medication includes  Atorvastatin  - Continue home medication  FEN/GI: Protonix. NS . Carb Modified Diet (Will need to be NPO at midnight prior to surgery) Prophylaxis: SQ Heparin  Disposition: Surgery possibly tomorrow  Subjective:  - No acute events overnight but admits to feeling febrile - Patient continues to complain of pain in his left foot; 8/10, improves with pain medication - Good PO intake and good appetite - Denies chest pain, shortness of breath, palpitations, nausea, vomiting, or diarrhea  Objective: Temp:  [100.2 F (37.9 C)-100.4 F (38 C)] 100.2 F (37.9 C) (02/02 0430) Pulse Rate:  [85-89] 88 (02/02 0430) Resp:  [16-22] 16 (02/02 0430) BP: (106-134)/(62-81) 117/68 mmHg (02/02 0430) SpO2:  [92 %-98 %] 92 % (02/02 0430) Weight:  [219 lb (99.338 kg)] 219 lb (99.338 kg) (02/01 2213) Physical Exam: General: In NAD, laying in bed Cardiovascular: S1 and S2 noted. No murmurs. Regular rate and rhythm. Respiratory: Clear to auscultation bilaterally. No wheezes. No increased work of breathing Abdomen: Bowel sounds noted. Nontender. Nondistended MSK: Edema of left lower extremity noted. Limited ROM of left ankle due to edema and pain. Skin: Erythema and warmth noted of left lower extremity with two ulcers noted on base of foot and one ulcer noted between first and second digit. Discoloration of 4th digit noted. Refer to pictures below.      Laboratory:  Recent Labs Lab 12/28/15 1253 12/29/15 0620  WBC 17.7* 13.5*  HGB 14.5 12.7*  HCT 42.3 37.4*  PLT 194 171    Recent Labs Lab 12/28/15 1647 12/29/15 0620  NA 129* 130*  K 4.7 4.4  CL 91* 94*  CO2 27 26  BUN 14 10  CREATININE 0.86 0.73  CALCIUM 8.7* 8.4*  GLUCOSE  182* 179*     Imaging/Diagnostic Tests: Mr Foot Left W Wo Contrast  12/28/2015  CLINICAL DATA:  Chronic left foot wound, stepped on a nail 6 months ago, had an abscess which drained and initially appear to heal well but 1 week ago the area worsened  and abscess return. Wound care debridement on 12/22/2014. Foot found to be very erythematous and swollen today at followup. Diabetes. EXAM: MRI OF THE LEFT FOREFOOT WITHOUT AND WITH CONTRAST TECHNIQUE: Multiplanar, multisequence MR imaging was performed both before and after administration of intravenous contrast. CONTRAST:  20mL MULTIHANCE GADOBENATE DIMEGLUMINE 529 MG/ML IV SOLN COMPARISON:  12/22/2015 FINDINGS: Osteomyelitis protocol MRI of the foot was obtained, to include the entire foot and ankle. This protocol uses a large field of view to cover the entire foot and ankle, and is suitable for assessing bony structures for osteomyelitis. Due to the large field of view and imaging plane choice, this protocol is less sensitive for assessing small structures such as ligamentous structures of the foot and ankle, compared to a dedicated forefoot or dedicated hindfoot exam. Although inversion recovery weighted images raise the possibility of some edema signal in the distal phalanx and proximal phalanx of the great toe, lack of low T1 signal in these bones suggests that this is probably due to field heterogeneity rather than osteomyelitis. There is potentially some low-level osteomyelitis, however, in the proximal phalanx of the third toe where there is some reduced T1 signal and also increased T2 signal, but only questionable enhancement. There is abnormal hypoenhancement in the soft tissues along the ball of the foot extending from the second MTP joint all the way over to the fifth toe, and tracking somewhat into the third, fourth, and fifth toes as shown on images 13 through 2 of series 6. Appearance concerning for tissue necrosis given the hypoperfusion. A small amount of this extends over to the base of the great toe medially. Although suspicious for potentially necrotic tissue, we do not demonstrate a drainable abscess. Subcutaneous edema and enhancement tracking along the dorsum of the foot compatible  cellulitis. Suspected blistering tracking around the dorsum of the fourth toe proximally. Lisfranc ligament intact. Midfoot degenerative arthropathy. Abnormal edema and enhancement tracks in the plantar musculature of the foot. Thickened medial band of the plantar fascia. IMPRESSION: 1. Suspected osteomyelitis in the proximal phalanx of the third toe, with extensive hypoperfusion of soft tissues tracking along the ball of the foot laterally and into the third, fourth, and fifth toes, concerning for necrosis. There is some questionable edema in some of the other phalanges but not borne out with low T1 signal on the T1 weighted images. 2. No drainable abscess. 3. Plantar fasciitis. 4. Cellulitis in the dorsum of the foot with probable myositis tracking along the plantar musculature of the foot. Electronically Signed   By: Gaylyn Rong M.D.   On: 12/28/2015 20:25     Beaulah Dinning, MD 12/29/2015, 7:25 AM PGY-1, [facility] Family Medicine FPTS Intern pager: [phone], text pages welcome

## 2015-12-29 NOTE — Progress Notes (Signed)
Utilization review completed.  

## 2015-12-29 NOTE — Telephone Encounter (Signed)
Pt has a fever and doesn't have any tylenol orders

## 2015-12-30 ENCOUNTER — Encounter (HOSPITAL_COMMUNITY)
Admission: EM | Disposition: A | Discharge status: Home / Self Care | Payer: Self-pay | Source: Home / Self Care | Attending: Family Medicine

## 2015-12-30 ENCOUNTER — Inpatient Hospital Stay (HOSPITAL_COMMUNITY): Payer: Medicaid Other | Admitting: Anesthesiology

## 2015-12-30 ENCOUNTER — Encounter (HOSPITAL_COMMUNITY): Payer: Self-pay | Admitting: Certified Registered"

## 2015-12-30 HISTORY — PX: I & D EXTREMITY: SHX5045

## 2015-12-30 LAB — BASIC METABOLIC PANEL
ANION GAP: 7 (ref 5–15)
BUN: 6 mg/dL (ref 6–20)
CHLORIDE: 95 mmol/L — AB (ref 101–111)
CO2: 30 mmol/L (ref 22–32)
CREATININE: 0.59 mg/dL — AB (ref 0.61–1.24)
Calcium: 8.5 mg/dL — ABNORMAL LOW (ref 8.9–10.3)
GFR calc non Af Amer: >60 mL/min (ref >60)
Glucose, Bld: 216 mg/dL — ABNORMAL HIGH (ref 65–99)
Potassium: 4.4 mmol/L (ref 3.5–5.1)
SODIUM: 132 mmol/L — AB (ref 135–145)

## 2015-12-30 LAB — SURGICAL PCR SCREEN
MRSA, PCR: NEGATIVE
STAPHYLOCOCCUS AUREUS: NEGATIVE

## 2015-12-30 LAB — GLUCOSE, CAPILLARY
GLUCOSE-CAPILLARY: 172 mg/dL — AB (ref 65–99)
GLUCOSE-CAPILLARY: 160 mg/dL — AB (ref 65–99)
Glucose-Capillary: 230 mg/dL — ABNORMAL HIGH (ref 65–99)
Glucose-Capillary: 169 mg/dL — ABNORMAL HIGH (ref 65–99)
Glucose-Capillary: 168 mg/dL — ABNORMAL HIGH (ref 65–99)

## 2015-12-30 LAB — HEMOGLOBIN A1C
HEMOGLOBIN A1C: 11.7 % — AB (ref 4.8–5.6)
Mean Plasma Glucose: 289 mg/dL

## 2015-12-30 SURGERY — IRRIGATION AND DEBRIDEMENT EXTREMITY
Anesthesia: General | Site: Foot | Laterality: Left

## 2015-12-30 MED ORDER — ACETAMINOPHEN 650 MG RE SUPP: 650.0000 mg | Freq: Four times a day (QID) | RECTAL | Status: DC | PRN

## 2015-12-30 MED ORDER — PROPOFOL 10 MG/ML IV BOLUS
INTRAVENOUS | Status: AC
  Filled 2015-12-30: qty 20

## 2015-12-30 MED ORDER — METOCLOPRAMIDE HCL 5 MG PO TABS: 5.0000 mg | ORAL_TABLET | Freq: Three times a day (TID) | ORAL | Status: DC | PRN

## 2015-12-30 MED ORDER — ACETAMINOPHEN 325 MG PO TABS: 650.0000 mg | ORAL_TABLET | Freq: Four times a day (QID) | ORAL | Status: DC | PRN

## 2015-12-30 MED ORDER — HYDROMORPHONE HCL 1 MG/ML IJ SOLN
INTRAMUSCULAR | Status: AC
  Administered 2015-12-30: 0.5 mg via INTRAVENOUS
  Filled 2015-12-30: qty 1

## 2015-12-30 MED ORDER — ONDANSETRON HCL 4 MG/2ML IJ SOLN
INTRAMUSCULAR | Status: AC
  Filled 2015-12-30: qty 2

## 2015-12-30 MED ORDER — SODIUM CHLORIDE 0.9 % IV SOLN: INTRAVENOUS | Status: DC

## 2015-12-30 MED ORDER — GENTAMICIN SULFATE 40 MG/ML IJ SOLN
INTRAMUSCULAR | Status: DC | PRN
  Administered 2015-12-30: 120 mg

## 2015-12-30 MED ORDER — GENTAMICIN SULFATE 40 MG/ML IJ SOLN
INTRAMUSCULAR | Status: AC
  Filled 2015-12-30: qty 4

## 2015-12-30 MED ORDER — SODIUM CHLORIDE 0.9 % IV SOLN
INTRAVENOUS | Status: DC
  Administered 2015-12-30: 23:00:00 via INTRAVENOUS

## 2015-12-30 MED ORDER — LACTATED RINGERS IV SOLN
INTRAVENOUS | Status: DC
  Administered 2015-12-30: 15:00:00 via INTRAVENOUS

## 2015-12-30 MED ORDER — MIDAZOLAM HCL 2 MG/2ML IJ SOLN
INTRAMUSCULAR | Status: AC
  Filled 2015-12-30: qty 2

## 2015-12-30 MED ORDER — METHOCARBAMOL 1000 MG/10ML IJ SOLN: 500.0000 mg | Freq: Four times a day (QID) | INTRAVENOUS | Status: DC | PRN

## 2015-12-30 MED ORDER — HYDROMORPHONE HCL 1 MG/ML IJ SOLN
0.2500 mg | INTRAMUSCULAR | Status: DC | PRN
  Administered 2015-12-30 (×4): 0.5 mg via INTRAVENOUS

## 2015-12-30 MED ORDER — METOCLOPRAMIDE HCL 5 MG/ML IJ SOLN: 5.0000 mg | Freq: Three times a day (TID) | INTRAMUSCULAR | Status: DC | PRN

## 2015-12-30 MED ORDER — PROPOFOL 10 MG/ML IV BOLUS
INTRAVENOUS | Status: DC | PRN
  Administered 2015-12-30: 170 mg via INTRAVENOUS

## 2015-12-30 MED ORDER — HYDROCODONE-ACETAMINOPHEN 10-325 MG PO TABS
1.0000 | ORAL_TABLET | Freq: Four times a day (QID) | ORAL | Status: DC
  Administered 2015-12-30 – 2015-12-31 (×4): 1 via ORAL
  Filled 2015-12-30 (×4): qty 1

## 2015-12-30 MED ORDER — METHOCARBAMOL 500 MG PO TABS
500.0000 mg | ORAL_TABLET | Freq: Four times a day (QID) | ORAL | Status: DC | PRN
  Administered 2015-12-30 – 2015-12-31 (×3): 500 mg via ORAL
  Filled 2015-12-30 (×3): qty 1

## 2015-12-30 MED ORDER — PROMETHAZINE HCL 25 MG/ML IJ SOLN: 6.2500 mg | INTRAMUSCULAR | Status: DC | PRN

## 2015-12-30 MED ORDER — VANCOMYCIN HCL 500 MG IV SOLR
INTRAVENOUS | Status: AC
  Filled 2015-12-30: qty 500

## 2015-12-30 MED ORDER — VANCOMYCIN HCL 500 MG IV SOLR
INTRAVENOUS | Status: DC | PRN
  Administered 2015-12-30: 500 mg

## 2015-12-30 MED ORDER — ONDANSETRON HCL 4 MG/2ML IJ SOLN
4.0000 mg | Freq: Four times a day (QID) | INTRAMUSCULAR | Status: DC | PRN
Start: 2015-12-30 — End: 2015-12-31

## 2015-12-30 MED ORDER — MIDAZOLAM HCL 5 MG/5ML IJ SOLN
INTRAMUSCULAR | Status: DC | PRN
  Administered 2015-12-30: 2 mg via INTRAVENOUS

## 2015-12-30 MED ORDER — ONDANSETRON HCL 4 MG/2ML IJ SOLN
INTRAMUSCULAR | Status: DC | PRN
  Administered 2015-12-30: 4 mg via INTRAVENOUS

## 2015-12-30 MED ORDER — INSULIN ASPART 100 UNIT/ML ~~LOC~~ SOLN
0.0000 [IU] | Freq: Three times a day (TID) | SUBCUTANEOUS | Status: DC
  Administered 2015-12-31: 3 [IU] via SUBCUTANEOUS
  Administered 2015-12-31: 5 [IU] via SUBCUTANEOUS

## 2015-12-30 MED ORDER — ONDANSETRON HCL 4 MG PO TABS: 4.0000 mg | ORAL_TABLET | Freq: Four times a day (QID) | ORAL | Status: DC | PRN

## 2015-12-30 MED ORDER — SODIUM CHLORIDE 0.9 % IV SOLN
INTRAVENOUS | Status: DC
  Filled 2015-12-30: qty 1000

## 2015-12-30 MED ORDER — HYDROMORPHONE HCL 1 MG/ML IJ SOLN
1.0000 mg | INTRAMUSCULAR | Status: DC | PRN
  Administered 2015-12-30 – 2015-12-31 (×2): 1 mg via INTRAVENOUS
  Filled 2015-12-30 (×2): qty 1

## 2015-12-30 MED ORDER — LIDOCAINE HCL (CARDIAC) 20 MG/ML IV SOLN
INTRAVENOUS | Status: AC
  Filled 2015-12-30: qty 5

## 2015-12-30 MED ORDER — LIDOCAINE HCL (CARDIAC) 20 MG/ML IV SOLN
INTRAVENOUS | Status: DC | PRN
  Administered 2015-12-30: 80 mg via INTRAVENOUS

## 2015-12-30 MED ORDER — 0.9 % SODIUM CHLORIDE (POUR BTL) OPTIME
TOPICAL | Status: DC | PRN
  Administered 2015-12-30: 1000 mL

## 2015-12-30 MED ORDER — FENTANYL CITRATE (PF) 250 MCG/5ML IJ SOLN
INTRAMUSCULAR | Status: DC | PRN
  Administered 2015-12-30: 50 ug via INTRAVENOUS

## 2015-12-30 MED ORDER — FENTANYL CITRATE (PF) 250 MCG/5ML IJ SOLN
INTRAMUSCULAR | Status: AC
  Filled 2015-12-30: qty 5

## 2015-12-30 SURGICAL SUPPLY — 51 items
BANDAGE ACE 6X5 VEL STRL LF (GAUZE/BANDAGES/DRESSINGS) ×3 IMPLANT
BANDAGE ELASTIC 4 LF NS (GAUZE/BANDAGES/DRESSINGS) ×3 IMPLANT
BLADE SAW (BLADE) ×3 IMPLANT
BLADE SURG 10 STRL SS (BLADE) IMPLANT
BLADE SURG 21 STRL SS (BLADE) ×3 IMPLANT
BNDG COHESIVE 4X5 TAN STRL (GAUZE/BANDAGES/DRESSINGS) IMPLANT
BNDG COHESIVE 6X5 TAN STRL LF (GAUZE/BANDAGES/DRESSINGS) IMPLANT
BNDG GAUZE ELAST 4 BULKY (GAUZE/BANDAGES/DRESSINGS) ×3 IMPLANT
COVER SURGICAL LIGHT HANDLE (MISCELLANEOUS) ×6 IMPLANT
DRAPE U-SHAPE 47X51 STRL (DRAPES) ×3 IMPLANT
DRSG ADAPTIC 3X8 NADH LF (GAUZE/BANDAGES/DRESSINGS) ×3 IMPLANT
DRSG EMULSION OIL 3X3 NADH (GAUZE/BANDAGES/DRESSINGS) ×3 IMPLANT
DURAPREP 26ML APPLICATOR (WOUND CARE) ×3 IMPLANT
ELECT CAUTERY BLADE 6.4 (BLADE) IMPLANT
ELECT REM PT RETURN 9FT ADLT (ELECTROSURGICAL)
ELECTRODE REM PT RTRN 9FT ADLT (ELECTROSURGICAL) IMPLANT
GAUZE SPONGE 4X4 12PLY STRL (GAUZE/BANDAGES/DRESSINGS) ×3 IMPLANT
GAUZE SPONGE 4X4 16PLY XRAY LF (GAUZE/BANDAGES/DRESSINGS) ×3 IMPLANT
GLOVE BIO SURGEON STRL SZ7 (GLOVE) ×6 IMPLANT
GLOVE BIOGEL PI IND STRL 7.5 (GLOVE) ×2 IMPLANT
GLOVE BIOGEL PI IND STRL 9 (GLOVE) ×1 IMPLANT
GLOVE BIOGEL PI INDICATOR 7.5 (GLOVE) ×4
GLOVE BIOGEL PI INDICATOR 9 (GLOVE) ×2
GLOVE SURG ORTHO 9.0 STRL STRW (GLOVE) ×3 IMPLANT
GOWN STRL REUS W/ TWL LRG LVL3 (GOWN DISPOSABLE) ×1 IMPLANT
GOWN STRL REUS W/ TWL XL LVL3 (GOWN DISPOSABLE) ×2 IMPLANT
GOWN STRL REUS W/TWL LRG LVL3 (GOWN DISPOSABLE) ×2
GOWN STRL REUS W/TWL XL LVL3 (GOWN DISPOSABLE) ×4
HANDPIECE INTERPULSE COAX TIP (DISPOSABLE)
KIT BASIN OR (CUSTOM PROCEDURE TRAY) ×3 IMPLANT
KIT PREVENA INCISION MGT 13 (CANNISTER) ×3 IMPLANT
KIT ROOM TURNOVER OR (KITS) ×3 IMPLANT
KIT STIMULAN RAPID CURE 5CC (Orthopedic Implant) ×3 IMPLANT
MANIFOLD NEPTUNE II (INSTRUMENTS) ×3 IMPLANT
NEEDLE 18GX1X1/2 (RX/OR ONLY) (NEEDLE) ×3 IMPLANT
NS IRRIG 1000ML POUR BTL (IV SOLUTION) ×3 IMPLANT
PACK ORTHO EXTREMITY (CUSTOM PROCEDURE TRAY) ×3 IMPLANT
PAD ARMBOARD 7.5X6 YLW CONV (MISCELLANEOUS) ×6 IMPLANT
SET HNDPC FAN SPRY TIP SCT (DISPOSABLE) IMPLANT
STOCKINETTE IMPERVIOUS 9X36 MD (GAUZE/BANDAGES/DRESSINGS) IMPLANT
SUT ETHILON 2 0 PSLX (SUTURE) ×6 IMPLANT
SWAB COLLECTION DEVICE MRSA (MISCELLANEOUS) ×3 IMPLANT
SYRINGE 3CC LL L/F (MISCELLANEOUS) ×3 IMPLANT
TOWEL OR 17X24 6PK STRL BLUE (TOWEL DISPOSABLE) ×3 IMPLANT
TOWEL OR 17X26 10 PK STRL BLUE (TOWEL DISPOSABLE) ×3 IMPLANT
TUBE ANAEROBIC SPECIMEN COL (MISCELLANEOUS) IMPLANT
TUBE CONNECTING 12'X1/4 (SUCTIONS) ×1
TUBE CONNECTING 12X1/4 (SUCTIONS) ×2 IMPLANT
UNDERPAD 30X30 INCONTINENT (UNDERPADS AND DIAPERS) ×3 IMPLANT
WATER STERILE IRR 1000ML POUR (IV SOLUTION) ×3 IMPLANT
YANKAUER SUCT BULB TIP NO VENT (SUCTIONS) ×3 IMPLANT

## 2015-12-30 NOTE — Progress Notes (Signed)
Family Medicine Teaching Service Daily Progress Note Intern Pager: [phone]  Patient name: Antonio [name]. Medical record number: [id number] Date of birth: 1956/05/09 Age: 60 y.o. Gender: male  Primary Care Provider: No PCP Per Patient Consultants: Ortho Code Status: FULL  Pt Overview and Major Events to Date:  2/1: Admit to FPTS 2/3: I&D  Assessment and Plan: Sigismund Cross. is a 60 y.o. male presenting with left lower extremity cellulitis with concern for possible osteomyelitis. PMH is significant for Type II DM, HTN, HLD, and hx DVT.  L Foot Cellulitis/Osteomyelitis: Wounds 2/2 stepping on a nail 6 months prior. Lactic Acid 1.26 at admission. qSOFA score of 0. MRI with osteomyelitis in proximal phalanx of third toe of left foot with hypoperfusion of soft tissues along ball of foot into third, fourth, and fifth toes concerning for necrosis. Was started on Doxycycline on 1/27 without improvement, so transitioned to vanc on 2/1. CRP 27.2. WBC 17.7>13.5 (2/2). ABI 0.78 bilaterally. Remains afebrile with VSS. Cleared by ortho for L foot I&D today.  - WOC consult - Follow up blood cultures - NGTD - Vancomycin per Pharmacy (2/1>>) - NORCO 10-325 mg q6 PRN - Orthopedics consulted, appreciate recommendations  Hyponatremia: Sodium 129 at admission; 132 today (2/3). Corrects to 134 with hyperglycemia. Serum osmolality, urine osmolality, and urine sodium WNL.  - AM BMPs - Continue to monitor  Diabetes: Home medications include Glipizide  and Lantus 30 units at bedtime. A1C 11.2 (2/1). Currently on Lantus 15U. CBGs 233, 230 overnight. - Increase Lantus as needed for glycemic control - Sensitive Insulin Sliding Scale  Hypertension: Home medications include Coreg 6.25mg  BID. BP 109/65, 153/77 overnight.  - Continue home meds  DVTs: History reported by patient. Home medications include Plavix . - [place] lower extremity pending - Holding Plavix due to surgery - Heparin  SQ  Tobacco Abuse: Smokes 2ppd. - Nicotine Patch - Smoking cessation counseling during hospitalization  Hyperlipidemia: Home medication includes Atorvastatin . - Continue home medication  FEN/GI: Protonix. NPO until after I&D, then carb modified diet.  Prophylaxis: SQ Heparin  Disposition: Surgery today. Discharge pending medical improvement.   Subjective:  Patient reports continued but not worsening pain of L foot. No other complaints.  Patient to OR today for L foot I&D.   Objective: Temp:  [98.8 F (37.1 C)-100.1 F (37.8 C)] 98.8 F (37.1 C) (02/03 0504) Pulse Rate:  [81-86] 81 (02/03 0504) Resp:  [16-17] 16 (02/03 0504) BP: (109-153)/(65-77) 109/65 mmHg (02/03 0504) SpO2:  [94 %-95 %] 94 % (02/03 0504) Physical Exam: General: Sitting up in bed, in NAD Cardiovascular: RRR, no murmurs appreciated Respiratory: CTAB, no wheezes, normal work of breathing Abdomen: Soft, non-tender, non-distended, +BS Skin: Erythema and warmth noted of left lower extremity with two ulcers noted on base of foot and one ulcer noted between first and second digit. Discoloration of 4th digit noted.   Laboratory:  Recent Labs Lab 12/28/15 1253 12/29/15 0620  WBC 17.7* 13.5*  HGB 14.5 12.7*  HCT 42.3 37.4*  PLT 194 171    Recent Labs Lab 12/28/15 1647 12/29/15 0620  NA 129* 130*  K 4.7 4.4  CL 91* 94*  CO2 27 26  BUN 14 10  CREATININE 0.86 0.73  CALCIUM 8.7* 8.4*  GLUCOSE 182* 179*    Imaging/Diagnostic Tests: Mr Foot Left W Wo Contrast  12/28/2015  CLINICAL DATA:  Chronic left foot wound, stepped on a nail 6 months ago, had an abscess which drained and initially appear  to heal well but 1 week ago the area worsened and abscess return. Wound care debridement on 12/22/2014. Foot found to be very erythematous and swollen today at followup. Diabetes. EXAM: MRI OF THE LEFT FOREFOOT WITHOUT AND WITH CONTRAST TECHNIQUE: Multiplanar, multisequence MR imaging was performed both before  and after administration of intravenous contrast. CONTRAST:  20mL MULTIHANCE GADOBENATE DIMEGLUMINE 529 MG/ML IV SOLN COMPARISON:  12/22/2015 FINDINGS: Osteomyelitis protocol MRI of the foot was obtained, to include the entire foot and ankle. This protocol uses a large field of view to cover the entire foot and ankle, and is suitable for assessing bony structures for osteomyelitis. Due to the large field of view and imaging plane choice, this protocol is less sensitive for assessing small structures such as ligamentous structures of the foot and ankle, compared to a dedicated forefoot or dedicated hindfoot exam. Although inversion recovery weighted images raise the possibility of some edema signal in the distal phalanx and proximal phalanx of the great toe, lack of low T1 signal in these bones suggests that this is probably due to field heterogeneity rather than osteomyelitis. There is potentially some low-level osteomyelitis, however, in the proximal phalanx of the third toe where there is some reduced T1 signal and also increased T2 signal, but only questionable enhancement. There is abnormal hypoenhancement in the soft tissues along the ball of the foot extending from the second MTP joint all the way over to the fifth toe, and tracking somewhat into the third, fourth, and fifth toes as shown on images 13 through 2 of series 6. Appearance concerning for tissue necrosis given the hypoperfusion. A small amount of this extends over to the base of the great toe medially. Although suspicious for potentially necrotic tissue, we do not demonstrate a drainable abscess. Subcutaneous edema and enhancement tracking along the dorsum of the foot compatible cellulitis. Suspected blistering tracking around the dorsum of the fourth toe proximally. Lisfranc ligament intact. Midfoot degenerative arthropathy. Abnormal edema and enhancement tracks in the plantar musculature of the foot. Thickened medial band of the plantar fascia.  IMPRESSION: 1. Suspected osteomyelitis in the proximal phalanx of the third toe, with extensive hypoperfusion of soft tissues tracking along the ball of the foot laterally and into the third, fourth, and fifth toes, concerning for necrosis. There is some questionable edema in some of the other phalanges but not borne out with low T1 signal on the T1 weighted images. 2. No drainable abscess. 3. Plantar fasciitis. 4. Cellulitis in the dorsum of the foot with probable myositis tracking along the plantar musculature of the foot. Electronically Signed   By: Gaylyn Rong M.D.   On: 12/28/2015 20:25    Marquette Saa, MD 12/30/2015, 9:00 AM PGY-1, [facility] Family Medicine FPTS Intern pager: [phone], text pages welcome

## 2015-12-30 NOTE — Discharge Summary (Signed)
Family Medicine Teaching Service [facility] Discharge Summary  Patient name: Antonio [name]. Medical record number: [id number] Date of birth: 12/16/1955 Age: 60 y.o. Gender: male Date of Admission: 12/28/2015  Date of Discharge: 12/31/15 Admitting Physician: Doreene Eland, MD  Primary Care Provider: No PCP Per Patient Consultants: orthopedics  Indication for Hospitalization: L lower extremity cellulitis with concern for osteomyelitis  Discharge Diagnoses/Problem List:  Patient Active Problem List   Diagnosis Date Noted  . Abscess and cellulitis   . Edema   . Wound infection (HCC)   . Cellulitis 12/28/2015     Disposition: Home   Discharge Condition: stable  Discharge Exam: see previous progress note  Brief Hospital Course:  Antonio [name] is a 60 yo M with PMH of Type II DM, HTN, HLD, and tobacco abuse presenting with L lower extremity cellulitis with concern for possible osteomyelitis. During admission, patient underwent transmetatarsal amputation of L foot.   Patient presented with worsening chronic foot ulcers, after stepping on a nail six months ago. He had already had an I&D of an abscess at the site, however the abscess reappeared about a week prior to admission. The site was debrided by wound care at that time, and doxycycline was started. However, symptoms of pain, edema, and erythema continued to worsen. He subsequently presented to [facility] for further treatment.   On admission, patient had increased WBC count at 17.7 and lactic acid at 1.26, however was afebrile with stable vital signs and a qSOFA score of 0. MRI showed osteomyelitis in proximal phalax of third toe of L foot with hypoperfusion of soft tissues along ball of foot into third, fourth, and fifth toes concerning for necrosis. Doxycycline was discontinued, and patient was started on vancomycin at this time. Orthopedics was consulted, who recommended I&D of the affected areas.   Patient was taken to the OR on 2/3  for I&D, however, due to the finding of necrotic tissue with nonviable circulation and gangrenous changes, patient received a transmetatarsal amputation instead. Wound vac was placed on amputation site. Patient was cleared for discharge by orthopedic surgery on 2/4 and sent home with Clindamycin to complete a 10 day antibiotic course.   All other chronic medical conditions stable throughout admission and managed with home regimens.  Issues for Follow Up:  1. S/p left transmetatarsal amputation: Will need to follow up with ortho in 1 week and continue Clindamycin  2. Diabetes management: Upon discharge, Glipizide was discontinued. Consider resuming Glipizide if glucose becomes uncontrolled   Significant Procedures: irrigation and debridement L foot, transmetatarsal amputation with application of antibiotic beads and wound vac (12/30/15)  Significant Labs and Imaging:   Recent Labs Lab 12/28/15 1253 12/29/15 0620 12/31/15 0842  WBC 17.7* 13.5* 6.8  HGB 14.5 12.7* 11.9*  HCT 42.3 37.4* 34.5*  PLT 194 171 199    Recent Labs Lab 12/28/15 1647 12/29/15 0620 12/30/15 0933 12/31/15 0842  NA 129* 130* 132* 131*  K 4.7 4.4 4.4 4.8  CL 91* 94* 95* 91*  CO2 GLUCOSE 182* 179* 216* 277*  BUN CREATININE 0.86 0.73 0.59* 0.77  CALCIUM 8.7* 8.4* 8.5* 8.8*    Blood culture (12/28/15) - NG x 3 days  Results/Tests Pending at Time of Discharge: none  Discharge Medications:    Medication List    STOP taking these medications        doxycycline 100 MG tablet  Commonly known as:  VIBRA-TABS  glipiZIDE 10 MG tablet  Commonly known as:  GLUCOTROL      TAKE these medications        atorvastatin 40 MG tablet  Commonly known as:  LIPITOR  Take 40 mg by mouth daily at 6 PM.     carvedilol 6.25 MG tablet  Commonly known as:  COREG  Take 6.25 mg by mouth 2 (two) times daily with a meal.     clindamycin 300 MG capsule  Commonly known as:  CLEOCIN  Take 1  capsule (300 mg total) by mouth 3 (three) times daily.     clopidogrel 75 MG tablet  Commonly known as:  PLAVIX  Take 75 mg by mouth daily.     HYDROcodone-acetaminophen 5-325 MG tablet  Commonly known as:  NORCO/VICODIN  Take 1 tablet by mouth every 6 (six) hours as needed for moderate pain.     insulin glargine 100 UNIT/ML injection  Commonly known as:  LANTUS  Inject 30 Units into the skin at bedtime.     lisinopril 5 MG tablet  Commonly known as:  PRINIVIL,ZESTRIL  Take 5 mg by mouth 2 (two) times daily.     oxyCODONE-acetaminophen 5-325 MG tablet  Commonly known as:  ROXICET  Take 1-2 tablets by mouth every 4 (four) hours as needed.     pregabalin 100 MG capsule  Commonly known as:  LYRICA  Take 100 mg by mouth 3 (three) times daily.        Discharge Instructions: Please refer to Patient Instructions section of EMR for full details.  Patient was counseled important signs and symptoms that should prompt return to medical care, changes in medications, dietary instructions, activity restrictions, and follow up appointments.   Follow-Up Appointments:     Follow-up Information    Follow up with DUDA,MARCUS V, MD In 1 week.   Specialty:  Orthopedic Surgery   Contact information:   [address] [place] [address] [phone]       Beaulah Dinning, MD 12/30/2015, 7:22 PM PGY-1, [facility] Family Medicine

## 2015-12-30 NOTE — Progress Notes (Signed)
Dr. Lajoyce Corners @ bedside to speak with patient post procedure.

## 2015-12-30 NOTE — Anesthesia Preprocedure Evaluation (Addendum)
Anesthesia Evaluation  Patient identified by MRN, date of birth, ID band Patient awake    Reviewed: Allergy & Precautions, NPO status , Patient's Chart, lab work & pertinent test results  Airway Mallampati: II  TM Distance: >3 FB Neck ROM: Full    Dental no notable dental hx.    Pulmonary Current Smoker,    Pulmonary exam normal breath sounds clear to auscultation       Cardiovascular negative cardio ROS Normal cardiovascular exam Rhythm:Regular Rate:Normal     Neuro/Psych negative neurological ROS  negative psych ROS   GI/Hepatic negative GI ROS, Neg liver ROS,   Endo/Other  diabetes, Insulin Dependent  Renal/GU negative Renal ROS  negative genitourinary   Musculoskeletal negative musculoskeletal ROS (+)   Abdominal   Peds negative pediatric ROS (+)  Hematology negative hematology ROS (+)   Anesthesia Other Findings   Reproductive/Obstetrics negative OB ROS                             Anesthesia Physical Anesthesia Plan  ASA: III  Anesthesia Plan: General   Post-op Pain Management:    Induction: Intravenous  Airway Management Planned: LMA  Additional Equipment:   Intra-op Plan:   Post-operative Plan: Extubation in OR  Informed Consent: I have reviewed the patients History and Physical, chart, labs and discussed the procedure including the risks, benefits and alternatives for the proposed anesthesia with the patient or authorized representative who has indicated his/her understanding and acceptance.   Dental advisory given  Plan Discussed with: CRNA and Surgeon  Anesthesia Plan Comments:        Anesthesia Quick Evaluation

## 2015-12-30 NOTE — Transfer of Care (Signed)
Immediate Anesthesia Transfer of Care Note  Patient: Antonio [name].  Procedure(s) Performed: Procedure(s): IRRIGATION AND DEBRIDEMENT LEFT FOOT, TRANSMETATARSAL AMPUTATION WITH APPLICATION OF ANTIBIOTIC BEADS AND WOUND VAC (Left)  Patient Location: PACU  Anesthesia Type:General  Level of Consciousness: lethargic and responds to stimulation  Airway & Oxygen Therapy: Patient Spontanous Breathing and Patient connected to nasal cannula oxygen  Post-op Assessment: Report given to RN  Post vital signs: Reviewed and stable  Last Vitals:  Filed Vitals:   12/30/15 0504 12/30/15 1256  BP: 109/65 108/64  Pulse: 81 74  Temp: 37.1 C 36.9 C  Resp: 16 18    Complications: No apparent anesthesia complications

## 2015-12-30 NOTE — Interval H&P Note (Signed)
History and Physical Interval Note:  12/30/2015 6:28 AM  Antonio [name].  has presented today for surgery, with the diagnosis of Abscess Left Foot  The various methods of treatment have been discussed with the patient and family. After consideration of risks, benefits and other options for treatment, the patient has consented to  Procedure(s): IRRIGATION AND DEBRIDEMENT LEFT FOOT (Left) as a surgical intervention .  The patient's history has been reviewed, patient examined, no change in status, stable for surgery.  I have reviewed the patient's chart and labs.  Questions were answered to the patient's satisfaction.     Ralene Gasparyan V

## 2015-12-30 NOTE — Op Note (Signed)
12/28/2015 - 12/30/2015  4:20 PM  PATIENT:  Antonio [name].    PRE-OPERATIVE DIAGNOSIS:  Abscess Left Foot  POST-OPERATIVE DIAGNOSIS:  Same  PROCEDURE:  IRRIGATION AND DEBRIDEMENT LEFT FOOT, TRANSMETATARSAL AMPUTATION WITH APPLICATION OF ANTIBIOTIC BEADS AND WOUND VAC  SURGEON:  Joakim Huesman V, MD  PHYSICIAN ASSISTANT:None ANESTHESIA:   General  PREOPERATIVE INDICATIONS:  [name]. is a  60 y.o. male with a diagnosis of Abscess Left Foot who failed conservative measures and elected for surgical management.    The risks benefits and alternatives were discussed with the patient preoperatively including but not limited to the risks of infection, bleeding, nerve injury, cardiopulmonary complications, the need for revision surgery, among others, and the patient was willing to proceed.  OPERATIVE IMPLANTS: Antibiotic beads with 500 mg vancomycin and 160 mg gentamicin, Prevena wound VAC.  OPERATIVE FINDINGS: Necrotic tissue involving the fourth toe third toe and great toe with nonviable circulation and gangrenous changes  OPERATIVE PROCEDURE: Patient was brought to the operating room and underwent a general anesthetic. After adequate levels anesthesia obtained patient's left lower extremity was prepped using DuraPrep draped into a sterile field. A timeout was called. Prior to surgery in the holding area was discussed with the patient that with the increasing necrotic appearance of his forefoot he may require several operations with an additional operation over the weekend. Dorsal and plantar incisions were made as originally planned these incisions carried down to foul-smelling necrotic tissue there was no viable muscle no viable subcutaneous tissue the fourth toe was completely necrotic full thickness. The great toe and the first webspace was necrotic as well as the base of the second toe. Due to the extensive necrosis of the forefoot further procedure with the irrigation and  debridement would not be beneficial to the patient and a transmetatarsal amputation was performed just proximal to the necrotic tissue. An oscillating saw was used to perform a transmetatarsal amputation. The wound was irrigated with normal saline. Due to the extensive necrotic nature of the forefoot antibiotic beads were made to placed deep within the wound. The wound was closed using 2-0 nylon. A wound VAC was applied to help improve the microcirculation. The Prevena wound VAC had a good suction fit. Patient was extubated taken to the PACU in stable condition.

## 2015-12-30 NOTE — Anesthesia Procedure Notes (Signed)
Procedure Name: LMA Insertion Date/Time: 12/30/2015 3:37 PM Performed by: Jefm Miles E Pre-anesthesia Checklist: Patient identified, Emergency Drugs available, Suction available, Patient being monitored and Timeout performed Patient Re-evaluated:Patient Re-evaluated prior to inductionOxygen Delivery Method: Circle system utilized Preoxygenation: Pre-oxygenation with 100% oxygen Intubation Type: IV induction Ventilation: Mask ventilation without difficulty LMA: LMA inserted LMA Size: 5.0 Number of attempts: 1 Placement Confirmation: positive ETCO2 and breath sounds checked- equal and bilateral Tube secured with: Tape Dental Injury: Teeth and Oropharynx as per pre-operative assessment

## 2015-12-30 NOTE — Progress Notes (Signed)
Inpatient Diabetes Program Recommendations  AACE/ADA: New Consensus Statement on Inpatient Glycemic Control (2015)  Target Ranges:  Prepandial:   less than 140 mg/dL      Peak postprandial:   less than 180 mg/dL (1-2 hours)      Critically ill patients:  140 - 180 mg/dL    Results for AARIAN, GRIFFIE (MRN [id number]) as of 12/30/2015 11:22  Ref. Range 12/29/2015 07:24 12/29/2015 11:22 12/29/2015 14:19 12/29/2015 16:11 12/29/2015 21:38  Glucose-Capillary Latest Ref Range: 65-99 mg/dL 409 (H) 811 (H) 914 (H) 233 (H) 233 (H)   Results for KEASTON, PILE (MRN [id number]) as of 12/30/2015 11:22  Ref. Range 12/30/2015 06:22  Glucose-Capillary Latest Ref Range: 65-99 mg/dL 086 (H)    Admit with: Food Wound  History: DM  Home DM Meds: Lantus 30 units QHS       Glipizide 10 mg daily  Current Insulin Orders: Lantus 15 units QHS      Novolog Sensitive SSI (0-9 units) TID AC       MD- Note patient with poor blood glucose control at home.  Needs PCP or Endocrinologist for better DM management.  Currently only on 50% home dose of Lantus.  AM CBG today elevated.  Note patient for I&D today and is NPO.  May need Novolog tid with meals at home to help better control CBGs.   Please consider the following:  1. Increase Lantus to 23 units QHS (75% of home dose)  2. Start Novolog Meal Coverage- Novolog 4 units tid with meals  3. May need Novolog at home as well in addition to his Lantus.  If you decide to send pt home on Novolog, please d/c Glipizide from home meds.    --Will follow patient during hospitalization--  Ambrose Finland RN, MSN, CDE Diabetes Coordinator Inpatient Glycemic Control Team Team Pager: [phone] (8a-5p)

## 2015-12-30 NOTE — H&P (View-Only) (Signed)
Reason for Consult: Abscess cellulitis left foot Referring Physician: Dr. Verlin Fester [facility]. is an 60 y.o. male.  HPI: Patient is a 60 year old gentleman who reports uncontrolled diabetes who is status post revascularization for both lower extremities with stenting on the left angioplasty on the right. Patient recently underwent debridement of 2 foot ulcers and he states the cellulitis developed after debridement of the ulcers left foot.  Past Medical History  Diagnosis Date  . Diabetes mellitus without complication [facility])     Past Surgical History  Procedure Laterality Date  . Cardiac catheterization    . Great toe amputation right.      No family history on file.  Social History:  reports that he has been smoking Cigarettes.  He has been smoking about 2.00 packs per day. He does not have any smokeless tobacco history on file. He reports that he does not drink alcohol or use illicit drugs.  Allergies:  Allergies  Allergen Reactions  . Penicillins Other (See Comments)    Medications: I have reviewed the patient's current medications.  Results for orders placed or performed during the hospital encounter of 12/28/15 (from the past 48 hour(s))  CBC with Differential     Status: Abnormal   Collection Time: 12/28/15 12:53 PM  Result Value Ref Range   WBC 17.7 (H) 4.0 - 10.5 K/uL   RBC 4.93 4.22 - 5.81 MIL/uL   Hemoglobin 14.5 13.0 - 17.0 g/dL   HCT 42.3 39.0 - 52.0 %   MCV 85.8 78.0 - 100.0 fL   MCH 29.4 26.0 - 34.0 pg   MCHC 34.3 30.0 - 36.0 g/dL   RDW 13.4 11.5 - 15.5 %   Platelets 194 150 - 400 K/uL   Neutrophils Relative % 81 %   Neutro Abs 14.3 (H) 1.7 - 7.7 K/uL   Lymphocytes Relative 11 %   Lymphs Abs 1.9 0.7 - 4.0 K/uL   Monocytes Relative 8 %   Monocytes Absolute 1.4 (H) 0.1 - 1.0 K/uL   Eosinophils Relative 0 %   Eosinophils Absolute 0.0 0.0 - 0.7 K/uL   Basophils Relative 0 %   Basophils Absolute 0.1 0.0 - 0.1 K/uL  Basic metabolic panel      Status: Abnormal   Collection Time: 12/28/15  4:47 PM  Result Value Ref Range   Sodium 129 (L) 135 - 145 mmol/L   Potassium 4.7 3.5 - 5.1 mmol/L    Comment: SLIGHT HEMOLYSIS   Chloride 91 (L) 101 - 111 mmol/L   CO2 27 22 - 32 mmol/L   Glucose, Bld 182 (H) 65 - 99 mg/dL   BUN 14 6 - 20 mg/dL   Creatinine, Ser 0.86 0.61 - 1.24 mg/dL   Calcium 8.7 (L) 8.9 - 10.3 mg/dL   GFR calc non Af Amer >60 >60 mL/min   GFR calc Af Amer >60 >60 mL/min    Comment: (NOTE) The eGFR has been calculated using the CKD EPI equation. This calculation has not been validated in all clinical situations. eGFR's persistently <60 mL/min signify possible Chronic Kidney Disease.    Anion gap 11 5 - 15  Sedimentation rate     Status: Abnormal   Collection Time: 12/28/15  4:47 PM  Result Value Ref Range   Sed Rate 85 (H) 0 - 16 mm/hr  I-Stat CG4 Lactic Acid, ED     Status: None   Collection Time: 12/28/15  4:54 PM  Result Value Ref Range   Lactic Acid, Venous  1.26 0.5 - 2.0 mmol/L  C-reactive protein     Status: Abnormal   Collection Time: 12/28/15  8:50 PM  Result Value Ref Range   CRP 27.2 (H) <1.0 mg/dL  Osmolality     Status: None   Collection Time: 12/28/15  8:50 PM  Result Value Ref Range   Osmolality 279 275 - 295 mOsm/kg  Glucose, capillary     Status: Abnormal   Collection Time: 12/28/15 10:41 PM  Result Value Ref Range   Glucose-Capillary 173 (H) 65 - 99 mg/dL  Osmolality, urine     Status: None   Collection Time: 12/29/15  3:34 AM  Result Value Ref Range   Osmolality, Ur 753 300 - 900 mOsm/kg  Sodium, urine, random     Status: None   Collection Time: 12/29/15  3:34 AM  Result Value Ref Range   Sodium, Ur 18 mmol/L    Mr Foot Left W Wo Contrast  12/28/2015  CLINICAL DATA:  Chronic left foot wound, stepped on a nail 6 months ago, had an abscess which drained and initially appear to heal well but 1 week ago the area worsened and abscess return. Wound care debridement on 12/22/2014. Foot  found to be very erythematous and swollen today at followup. Diabetes. EXAM: MRI OF THE LEFT FOREFOOT WITHOUT AND WITH CONTRAST TECHNIQUE: Multiplanar, multisequence MR imaging was performed both before and after administration of intravenous contrast. CONTRAST:  1m MULTIHANCE GADOBENATE DIMEGLUMINE 529 MG/ML IV SOLN COMPARISON:  12/22/2015 FINDINGS: Osteomyelitis protocol MRI of the foot was obtained, to include the entire foot and ankle. This protocol uses a large field of view to cover the entire foot and ankle, and is suitable for assessing bony structures for osteomyelitis. Due to the large field of view and imaging plane choice, this protocol is less sensitive for assessing small structures such as ligamentous structures of the foot and ankle, compared to a dedicated forefoot or dedicated hindfoot exam. Although inversion recovery weighted images raise the possibility of some edema signal in the distal phalanx and proximal phalanx of the great toe, lack of low T1 signal in these bones suggests that this is probably due to field heterogeneity rather than osteomyelitis. There is potentially some low-level osteomyelitis, however, in the proximal phalanx of the third toe where there is some reduced T1 signal and also increased T2 signal, but only questionable enhancement. There is abnormal hypoenhancement in the soft tissues along the ball of the foot extending from the second MTP joint all the way over to the fifth toe, and tracking somewhat into the third, fourth, and fifth toes as shown on images 13 through 2 of series 6. Appearance concerning for tissue necrosis given the hypoperfusion. A small amount of this extends over to the base of the great toe medially. Although suspicious for potentially necrotic tissue, we do not demonstrate a drainable abscess. Subcutaneous edema and enhancement tracking along the dorsum of the foot compatible cellulitis. Suspected blistering tracking around the dorsum of the  fourth toe proximally. Lisfranc ligament intact. Midfoot degenerative arthropathy. Abnormal edema and enhancement tracks in the plantar musculature of the foot. Thickened medial band of the plantar fascia. IMPRESSION: 1. Suspected osteomyelitis in the proximal phalanx of the third toe, with extensive hypoperfusion of soft tissues tracking along the ball of the foot laterally and into the third, fourth, and fifth toes, concerning for necrosis. There is some questionable edema in some of the other phalanges but not borne out with low T1 signal on the  T1 weighted images. 2. No drainable abscess. 3. Plantar fasciitis. 4. Cellulitis in the dorsum of the foot with probable myositis tracking along the plantar musculature of the foot. Electronically Signed   By: Van Clines M.D.   On: 12/28/2015 20:25    Review of Systems  All other systems reviewed and are negative.  Blood pressure 117/68, pulse 88, temperature 100.2 F (37.9 C), temperature source Oral, resp. rate 16, height 6' (1.829 m), weight 99.338 kg (219 lb), SpO2 92 %. Physical Exam On examination patient has good hair growth in both legs. He does not have a palpable dorsalis pedis pulse bilaterally. Patient status post amputation of the right great toe. Left foot shows cellulitis involving almost the entire dorsum of the foot. The forefoot is tender to palpation. There is no purulent drainage from the ulcers. Review of the MRI scan does show what appears to be a large space of nonviable tissue on the plantar forefoot Assessment/Plan: Assessment: Uncontrolled diabetes with peripheral vascular disease cellulitis and a large possible abscess plantar aspect of the left forefoot.  Plan: I will order ankle-brachial indices. Plan for surgical intervention tomorrow with debridement of the plantar area of nonviable tissue left foot  Dorrance Sellick V 12/29/2015, 6:52 AM

## 2015-12-30 NOTE — Anesthesia Postprocedure Evaluation (Signed)
Anesthesia Post Note  Patient: Antonio [name].  Procedure(s) Performed: Procedure(s) (LRB): IRRIGATION AND DEBRIDEMENT LEFT FOOT, TRANSMETATARSAL AMPUTATION WITH APPLICATION OF ANTIBIOTIC BEADS AND WOUND VAC (Left)  Patient location during evaluation: PACU Anesthesia Type: General Level of consciousness: awake Pain management: pain level controlled Vital Signs Assessment: post-procedure vital signs reviewed and stable Respiratory status: spontaneous breathing Cardiovascular status: stable Anesthetic complications: no    Last Vitals:  Filed Vitals:   12/30/15 1630 12/30/15 1645  BP: 111/69 119/79  Pulse: 75 74  Temp:    Resp: 20 22    Last Pain:  Filed Vitals:   12/30/15 1655  PainSc: 0-No pain                 EDWARDS,Chizaram Latino

## 2015-12-31 LAB — BASIC METABOLIC PANEL
ANION GAP: 14 (ref 5–15)
BUN: 9 mg/dL (ref 6–20)
CALCIUM: 8.8 mg/dL — AB (ref 8.9–10.3)
CHLORIDE: 91 mmol/L — AB (ref 101–111)
CO2: 26 mmol/L (ref 22–32)
CREATININE: 0.77 mg/dL (ref 0.61–1.24)
GFR calc non Af Amer: >60 mL/min (ref >60)
Glucose, Bld: 277 mg/dL — ABNORMAL HIGH (ref 65–99)
Potassium: 4.8 mmol/L (ref 3.5–5.1)
SODIUM: 131 mmol/L — AB (ref 135–145)

## 2015-12-31 LAB — CBC
HEMATOCRIT: 34.5 % — AB (ref 39.0–52.0)
HEMOGLOBIN: 11.9 g/dL — AB (ref 13.0–17.0)
MCH: 29.3 pg (ref 26.0–34.0)
MCHC: 34.5 g/dL (ref 30.0–36.0)
MCV: 85 fL (ref 78.0–100.0)
Platelets: 199 10*3/uL (ref 150–400)
RBC: 4.06 MIL/uL — ABNORMAL LOW (ref 4.22–5.81)
RDW: 13.4 % (ref 11.5–15.5)
WBC: 6.8 10*3/uL (ref 4.0–10.5)

## 2015-12-31 LAB — GLUCOSE, CAPILLARY
GLUCOSE-CAPILLARY: 237 mg/dL — AB (ref 65–99)
GLUCOSE-CAPILLARY: 119 mg/dL — AB (ref 65–99)
Glucose-Capillary: 163 mg/dL — ABNORMAL HIGH (ref 65–99)

## 2015-12-31 MED ORDER — CLINDAMYCIN HCL 300 MG PO CAPS
300.0000 mg | ORAL_CAPSULE | Freq: Three times a day (TID) | ORAL | Status: DC
  Administered 2015-12-31 (×2): 300 mg via ORAL
  Filled 2015-12-31 (×2): qty 1

## 2015-12-31 MED ORDER — OXYCODONE-ACETAMINOPHEN 5-325 MG PO TABS: 1.0000 | ORAL_TABLET | ORAL | Status: DC | PRN

## 2015-12-31 MED ORDER — HYDROCODONE-ACETAMINOPHEN 5-325 MG PO TABS: 1.0000 | ORAL_TABLET | Freq: Four times a day (QID) | ORAL | Status: DC | PRN

## 2015-12-31 MED ORDER — INSULIN GLARGINE 100 UNIT/ML ~~LOC~~ SOLN
30.0000 [IU] | Freq: Every day | SUBCUTANEOUS | Status: DC
  Filled 2015-12-31: qty 0.3

## 2015-12-31 MED ORDER — INSULIN GLARGINE 100 UNIT/ML ~~LOC~~ SOLN
18.0000 [IU] | Freq: Every day | SUBCUTANEOUS | Status: DC
  Filled 2015-12-31: qty 0.18

## 2015-12-31 MED ORDER — CLINDAMYCIN HCL 300 MG PO CAPS: 300.0000 mg | ORAL_CAPSULE | Freq: Three times a day (TID) | ORAL | Status: DC

## 2015-12-31 NOTE — Discharge Instructions (Signed)
You where admitted for a severe infection of the bones in your left foot. You had a surgical procedure to remove the infected area from your left foot. You received antibiotics during this admission to help ensure that the infection was stopped.   1. Please continue to take clindamycin 3 times a day at breakfast and lunch and dinner for A total of 10 days 2. Please follow up with your orthopedic surgeon as discussed today, in 1 week 3. Please follow-up with your primary care physician in the next week as well. 4. Provided Norco for pain control. Can use on one pill every 6 hours as needed.  5. Please stop your glipizide for now, would consider restarting when you follow-up with your PCP.

## 2015-12-31 NOTE — Care Management Note (Addendum)
Case Management Note  Patient Details  Name: Antonio [name]. MRN: [id number] Date of Birth: 11/01/1956  Subjective/Objective:                  s/p L transmetatarsal amputation  Action/Plan: CM spoke to patient regarding discharge plan. CM called [facility] who said that they have already left for the day and patient will need to get Tub bench from [facility] DME company on Monday. Patient was agreeable. Patient said that he has family that lives at home as well as near by and declined having any further needs and did not feel that he had any HH needs. CM asked about PCP and patient said that [place] sees [place] in [place], [place]. Provena wound vac to foot to stay on until seen by MD in 1 week. CM advised for patient to call MD with any questions.  CM remains available for further discharge planning needs.   Expected Discharge Date:  12/30/14              Expected Discharge Plan:  Home/Self Care  In-House Referral:     Discharge planning Services  CM Consult  Post Acute Care Choice:    Choice offered to:  Patient  DME Arranged:  Tub bench DME Agency:  [facility]  HH Arranged:    [facility] Agency:     Status of Service:  Completed, signed off  Medicare Important Message Given:    Date Medicare IM Given:    Medicare IM give by:    Date Additional Medicare IM Given:    Additional Medicare Important Message give by:     If discussed at Long Length of Stay Meetings, dates discussed:    Additional Comments:  Darcel Smalling, RN 12/31/2015, 5:15 PM

## 2015-12-31 NOTE — Progress Notes (Signed)
Family Medicine Teaching Service Daily Progress Note Intern Pager: [phone]  Patient name: Antonio [name]. Medical record number: [id number] Date of birth: 03-Mar-1956 Age: 60 y.o. Gender: male  Primary Care Provider: No PCP Per Patient Consultants: Ortho Code Status: FULL  Pt Overview and Major Events to Date:  2/1: Admit to FPTS 2/3: L transmetatarsal amputation  Assessment and Plan: Antonio [name]. is a 60 y.o. male presenting with left lower extremity cellulitis with concern for possible osteomyelitis. PMH is significant for Type II DM, HTN, HLD, and hx DVT.  L Foot Cellulitis/Osteomyelitis: POD #1 from L transmetatarsal amputation. Remains afebrile with VSS. Orthopedic surgery ok with patient transitioning to oral Clindamycin today and going home this afternoon with portable wound vac.  - Follow up blood cultures - NGTD - Transition from IV Vancomycin to oral  - NORCO 10-325 mg q6 PRN - Orthopedics consulted, appreciate recommendations  Hyponatremia: Sodium 129 at admission. Last Na 132 yesterday. Serum osmolality, urine osmolality, and urine sodium WNL.  - AM BMPs - Continue to monitor  Diabetes: Home medications include Glipizide  and Lantus 30 units at bedtime. A1C 11.2 (2/1). Currently on Lantus 15U. CBGs 160-237 over last 24 hours - Increase Lantus to home dose of 30 units - Sensitive Insulin Sliding Scale  Hypertension: Home medications include Coreg 6.25mg  BID. BP 109/65, 153/77 overnight.  - Continue home meds  DVTs: History reported by patient. Home medications include Plavix . [place] lower extremity negative.  - Resume home Plavix  - Heparin SQ  Tobacco Abuse: Smokes 2ppd. - Nicotine Patch - Smoking cessation counseling during hospitalization  Hyperlipidemia: Home medication includes Atorvastatin . - Continue home medication  FEN/GI: Protonix. NPO until after I&D, then carb modified diet.  Prophylaxis: SQ Heparin  Disposition: Home  today, ok per surgery  Subjective:  - POD #1, Pt is in good spirits this morning - Has pain in left foot, relieved with current dose of NORCO - Denies any chest pain, shortness of breath, or palpitations  Objective: Temp:  [97.8 F (36.6 C)-99.3 F (37.4 C)] 98.8 F (37.1 C) (02/04 0439) Pulse Rate:  [72-86] 86 (02/04 0439) Resp:  [16-22] 18 (02/04 0439) BP: (103-159)/(63-79) 159/79 mmHg (02/04 0439) SpO2:  [92 %-98 %] 94 % (02/04 0439) Physical Exam: General: Sitting up in bed, in NAD Cardiovascular: RRR, no murmurs appreciated Respiratory: CTAB, no wheezes, normal work of breathing Abdomen: Soft, non-tender, non-distended, +BS Skin: Left foot with transmetatarsal amputation, wound vac in place. Right foot with great toe amputation  Laboratory:  Recent Labs Lab 12/28/15 1253 12/29/15 0620 12/31/15 0842  WBC 17.7* 13.5* 6.8  HGB 14.5 12.7* 11.9*  HCT 42.3 37.4* 34.5*  PLT 194 171 199    Recent Labs Lab 12/28/15 1647 12/29/15 0620 12/30/15 0933  NA 129* 130* 132*  K 4.7 4.4 4.4  CL 91* 94* 95*  CO2 BUN CREATININE 0.86 0.73 0.59*  CALCIUM 8.7* 8.4* 8.5*  GLUCOSE 182* 179* 216*    Imaging/Diagnostic Tests: Mr Foot Left W Wo Contrast  12/28/2015  CLINICAL DATA:  Chronic left foot wound, stepped on a nail 6 months ago, had an abscess which drained and initially appear to heal well but 1 week ago the area worsened and abscess return. Wound care debridement on 12/22/2014. Foot found to be very erythematous and swollen today at followup. Diabetes. EXAM: MRI OF THE LEFT FOREFOOT WITHOUT AND WITH CONTRAST TECHNIQUE: Multiplanar, multisequence MR imaging was  performed both before and after administration of intravenous contrast. CONTRAST:  20mL MULTIHANCE GADOBENATE DIMEGLUMINE 529 MG/ML IV SOLN COMPARISON:  12/22/2015 FINDINGS: Osteomyelitis protocol MRI of the foot was obtained, to include the entire foot and ankle. This protocol uses a large field of  view to cover the entire foot and ankle, and is suitable for assessing bony structures for osteomyelitis. Due to the large field of view and imaging plane choice, this protocol is less sensitive for assessing small structures such as ligamentous structures of the foot and ankle, compared to a dedicated forefoot or dedicated hindfoot exam. Although inversion recovery weighted images raise the possibility of some edema signal in the distal phalanx and proximal phalanx of the great toe, lack of low T1 signal in these bones suggests that this is probably due to field heterogeneity rather than osteomyelitis. There is potentially some low-level osteomyelitis, however, in the proximal phalanx of the third toe where there is some reduced T1 signal and also increased T2 signal, but only questionable enhancement. There is abnormal hypoenhancement in the soft tissues along the ball of the foot extending from the second MTP joint all the way over to the fifth toe, and tracking somewhat into the third, fourth, and fifth toes as shown on images 13 through 2 of series 6. Appearance concerning for tissue necrosis given the hypoperfusion. A small amount of this extends over to the base of the great toe medially. Although suspicious for potentially necrotic tissue, we do not demonstrate a drainable abscess. Subcutaneous edema and enhancement tracking along the dorsum of the foot compatible cellulitis. Suspected blistering tracking around the dorsum of the fourth toe proximally. Lisfranc ligament intact. Midfoot degenerative arthropathy. Abnormal edema and enhancement tracks in the plantar musculature of the foot. Thickened medial band of the plantar fascia. IMPRESSION: 1. Suspected osteomyelitis in the proximal phalanx of the third toe, with extensive hypoperfusion of soft tissues tracking along the ball of the foot laterally and into the third, fourth, and fifth toes, concerning for necrosis. There is some questionable edema in some  of the other phalanges but not borne out with low T1 signal on the T1 weighted images. 2. No drainable abscess. 3. Plantar fasciitis. 4. Cellulitis in the dorsum of the foot with probable myositis tracking along the plantar musculature of the foot. Electronically Signed   By: Gaylyn Rong M.D.   On: 12/28/2015 20:25    Beaulah Dinning, MD 12/31/2015, 7:31 AM PGY-1, [facility] Family Medicine FPTS Intern pager: [phone], text pages welcome

## 2015-12-31 NOTE — Progress Notes (Signed)
Occupational Therapy Evaluation Patient Details Name: Antonio [name]. MRN: [id number] DOB: [birth year] Today's Date: 12/31/2015    History of Present Illness 60 y.o. male s/p L transmetatarsal amputation. PMH significant for DM, great toe amputation.   Clinical Impression   PTA, pt was independent with all ADLs and occasionally used assistive device for mobility. Pt currently requires min guard assist for all functional transfers and ambulation due to impulsivity and balance impairments associated with NWB status of LLE. Pt required mod verbal cues to avoid weight-bearing through L heel and had LOB x2 but was able to regain balance independently. Pt plans to d/c home with assistance from mother and daughter intermittently. Pt will benefit from continued acute OT to increase independence and safety with ADLs and mobility to allow for safe discharge home. Recommending tub transfer bench to facilitate safe transfers while adhering to LLE NWB status.     Follow Up Recommendations  No OT follow up;Supervision - Intermittent    Equipment Recommendations  Tub/shower bench    Recommendations for Other Services       Precautions / Restrictions Precautions Precautions: Fall Restrictions Weight Bearing Restrictions: Yes LLE Weight Bearing: Non weight bearing      Mobility Bed Mobility Overal bed mobility: Needs Assistance Bed Mobility: Supine to Sit     Supine to sit: Min guard     General bed mobility comments: Min guard for safety - pt impulsive and required verbal cues to wait for therapist assistance. Pt claims he was not told he was NWB on LLE and required mod verbal cues to avoid weight-bearing through L heel during transfers and ambulation.  Transfers Overall transfer level: Needs assistance Equipment used: Rolling walker (2 wheeled) Transfers: Sit to/from Stand Sit to Stand: Min guard         General transfer comment: Min guard assist for safaety. Mod verbal cues  for safe hand placement on seated surfaces and to avoid pulling up on RW to stand.    Balance Overall balance assessment: Needs assistance Sitting-balance support: No upper extremity supported;Feet supported Sitting balance-Leahy Scale: Good     Standing balance support: Bilateral upper extremity supported;During functional activity Standing balance-Leahy Scale: Poor Standing balance comment: LOB x2 - pt able to regain balance without physical assist.                            ADL Overall ADL's : Needs assistance/impaired     Grooming: Wash/dry hands;Min guard;Cueing for safety;Standing           Upper Body Dressing : Supervision/safety;Sitting   Lower Body Dressing: Supervision/safety;Cueing for compensatory techniques;Sit to/from stand Lower Body Dressing Details (indicate cue type and reason): Cues to dress R leg first Toilet Transfer: Min guard;Cueing for safety;Ambulation;Comfort height toilet;Grab bars;RW Statistician Details (indicate cue type and reason): Cues to use grab bar to simulate sink vanity at home Toileting- Architect and Hygiene: Min guard;Sit to/from stand       Functional mobility during ADLs: Min guard;Rolling walker General ADL Comments: Pt anxious, impulsive and did not follow commands well. Pt required mod verbal cues to not weight-bear on LLE. Pt able to reach LB for dressing and bathing tasks, but will need DME to adhere to NWB status.     Vision Vision Assessment?: No apparent visual deficits   Perception     Praxis      Pertinent Vitals/Pain Pain Assessment: 0-10 Pain Score: 4  Pain Location:  LLE Pain Descriptors / Indicators: Aching;Sore Pain Intervention(s): Limited activity within patient's tolerance;Monitored during session;Repositioned;Premedicated before session     Hand Dominance Right   Extremity/Trunk Assessment Upper Extremity Assessment Upper Extremity Assessment: Overall WFL for tasks assessed  (slight weakness in RUE due to shoulder injury)   Lower Extremity Assessment Lower Extremity Assessment: LLE deficits/detail LLE Deficits / Details: decreased AROM and strength in ankle as expected post op   Cervical / Trunk Assessment Cervical / Trunk Assessment: Normal   Communication Communication Communication: No difficulties   Cognition Arousal/Alertness: Awake/alert Behavior During Therapy: Anxious;Impulsive Overall Cognitive Status: Within Functional Limits for tasks assessed                     General Comments       Exercises       Shoulder Instructions      Home Living Family/patient expects to be discharged to:: Private residence Living Arrangements: Children (60 y.o. daughter) Available Help at Discharge: Family;Available PRN/intermittently Type of Home: Mobile home Home Access: Stairs to enter Entrance Stairs-Number of Steps: 3 Entrance Stairs-Rails: Right;Left;Can reach both Home Layout: One level     Bathroom Shower/Tub: Walk-in shower;Door;Tub/shower unit;Curtain Shower/tub characteristics: Engineer, building services: Standard Bathroom Accessibility: Yes How Accessible: Accessible via walker Home Equipment: Walker - 2 wheels;Cane - single point;Crutches;Wheelchair - manual          Prior Functioning/Environment Level of Independence: Independent with assistive device(s)        Comments: Occassionally uses AD (RW or SPC).    OT Diagnosis: Acute pain   OT Problem List: Decreased strength;Decreased range of motion;Decreased activity tolerance;Impaired balance (sitting and/or standing);Decreased coordination;Decreased safety awareness;Decreased knowledge of use of DME or AE;Decreased knowledge of precautions;Pain   OT Treatment/Interventions: Self-care/ADL training;Therapeutic exercise;Energy conservation;DME and/or AE instruction;Therapeutic activities;Patient/family education;Balance training    OT Goals(Current goals can be found in the  care plan section) Acute Rehab OT Goals Patient Stated Goal: to go home today OT Goal Formulation: With patient Time For Goal Achievement: 01/14/16 Potential to Achieve Goals: Good ADL Goals Pt Will Transfer to Toilet: with modified independence;ambulating;regular height toilet Pt Will Perform Toileting - Clothing Manipulation and hygiene: with modified independence;sitting/lateral leans;sit to/from stand Pt Will Perform Tub/Shower Transfer: Tub transfer;ambulating;tub bench;rolling walker  OT Frequency: Min 2X/week   Barriers to D/C:            Co-evaluation              End of Session Equipment Utilized During Treatment: Gait belt;Rolling walker Nurse Communication: Mobility status;Weight bearing status  Activity Tolerance: Patient tolerated treatment well Patient left: in chair;with call bell/phone within reach;with nursing/sitter in room   Time: 1610-9604 OT Time Calculation (min): 34 min Charges:  OT General Charges $OT Visit: 1 Procedure OT Evaluation $OT Eval Moderate Complexity: 1 Procedure OT Treatments $Self Care/Home Management : 8-22 mins G-Codes:    Nils Pyle, OTR/L Pager: [phone] 12/31/2015, 1:03 PM

## 2015-12-31 NOTE — Evaluation (Signed)
Physical Therapy Evaluation Patient Details Name: Antonio [name]. MRN: [id number] DOB: 05/12/1956 Today's Date: 12/31/2015   History of Present Illness  60 y.o. male s/p L transmetatarsal amputation. PMH significant for DM, great toe amputation.  Clinical Impression  All education complete. Pt only willing to participate in PT eval in room, with ambulation from recliner to bed. Educated pt on importance of maintaining NWB LLE for proper healing. He has all needed home equipment. He verbalizes no questions or concerns regarding mobility at home. No f/u services indicated. PT signing off.    Follow Up Recommendations No PT follow up;Supervision for mobility/OOB    Equipment Recommendations  None recommended by PT    Recommendations for Other Services       Precautions / Restrictions Precautions Precautions: Fall;Other (comment) Precaution Comments: wound vac Restrictions Weight Bearing Restrictions: Yes LLE Weight Bearing: Non weight bearing      Mobility  Bed Mobility Overal bed mobility: Needs Assistance Bed Mobility: Sit to Supine;Supine to Sit     Supine to sit: Supervision Sit to supine: Supervision   General bed mobility comments: Pt impulsive with decreased safety awareness. No physical assist needed.  Transfers Overall transfer level: Needs assistance Equipment used: Rolling walker (2 wheeled) Transfers: Sit to/from Stand Sit to Stand: Min guard         General transfer comment: verbal cues for hand placement  Ambulation/Gait Ambulation/Gait assistance: Min guard Ambulation Distance (Feet): 5 Feet Assistive device: Rolling walker (2 wheeled) Gait Pattern/deviations: Step-to pattern;Antalgic Gait velocity: decreased   General Gait Details: verbal cues for NWB LLE  Stairs Stairs:  (Pt declining stair training, stating "I have plenty of help and I'm not worried about getting in my house. I'll crawl up if I have to." Pt educated on bumping up steps  on his bottom and having w/c locked at top to sit back into.)          Wheelchair Mobility    Modified Rankin (Stroke Patients Only)       Balance Overall balance assessment: Needs assistance Sitting-balance support: No upper extremity supported;Feet supported Sitting balance-Leahy Scale: Good     Standing balance support: Bilateral upper extremity supported;During functional activity Standing balance-Leahy Scale: Poor Standing balance comment: LOB x2 - pt able to regain balance without physical assist.                             Pertinent Vitals/Pain Pain Assessment: 0-10 Pain Score: 8  Pain Location: LLE after mobility Pain Descriptors / Indicators: Aching Pain Intervention(s): Limited activity within patient's tolerance;Monitored during session;Repositioned;Premedicated before session    Home Living Family/patient expects to be discharged to:: Private residence Living Arrangements: Children Available Help at Discharge: Family;Available PRN/intermittently Type of Home: Mobile home Home Access: Stairs to enter Entrance Stairs-Rails: Right;Left;Can reach both Entrance Stairs-Number of Steps: 3 Home Layout: One level Home Equipment: Walker - 2 wheels;Cane - single point;Crutches;Wheelchair - manual      Prior Function Level of Independence: Independent with assistive device(s)         Comments: Occassionally uses AD (RW or SPC).     Hand Dominance   Dominant Hand: Right    Extremity/Trunk Assessment   Upper Extremity Assessment: Overall WFL for tasks assessed (slight weakness in RUE due to shoulder injury)           Lower Extremity Assessment: LLE deficits/detail   LLE Deficits / Details: decreased AROM and strength in  ankle as expected post op  Cervical / Trunk Assessment: Normal  Communication   Communication: No difficulties  Cognition Arousal/Alertness: Awake/alert Behavior During Therapy: Agitated;Impulsive Overall Cognitive  Status: Within Functional Limits for tasks assessed                      General Comments      Exercises        Assessment/Plan    PT Assessment Patent does not need any further PT services  PT Diagnosis Difficulty walking;Acute pain   PT Problem List    PT Treatment Interventions     PT Goals (Current goals can be found in the Care Plan section) Acute Rehab PT Goals Patient Stated Goal: to go home today PT Goal Formulation: All assessment and education complete, DC therapy    Frequency     Barriers to discharge        Co-evaluation               End of Session   Activity Tolerance: Treatment limited secondary to agitation Patient left: in bed;with call bell/phone within reach Nurse Communication: Mobility status         Time: 1250-1302 PT Time Calculation (min) (ACUTE ONLY): 12 min   Charges:   PT Evaluation $PT Eval Low Complexity: 1 Procedure     PT G Codes:        Ilda Foil 12/31/2015, 2:12 PM

## 2015-12-31 NOTE — Progress Notes (Signed)
Patient ID: Antonio [name]., male   DOB: [birth year], 58 y.o.   MRN: [id number] Has portable VAC on his left foot.  Doing well.  His IV is out.  Can go home from Ortho standpoint on oral Clindamycin TID and pain meds with follow-up in 1 weeks with Dr. Lajoyce Corners.

## 2016-01-02 LAB — CULTURE, BLOOD (ROUTINE X 2)
CULTURE: NO GROWTH
Culture: NO GROWTH

## 2016-01-03 ENCOUNTER — Encounter (HOSPITAL_COMMUNITY): Payer: Self-pay | Admitting: Orthopedic Surgery

## 2016-01-24 ENCOUNTER — Encounter (HOSPITAL_COMMUNITY): Payer: Self-pay | Admitting: Orthopedic Surgery

## 2016-01-24 NOTE — Addendum Note (Signed)
Addendum  created 01/24/16 0955 by Judie Petit, MD   Modules edited: Anesthesia Events

## 2017-03-24 ENCOUNTER — Inpatient Hospital Stay (HOSPITAL_COMMUNITY)
Admission: EM | Admit: 2017-03-24 | Discharge: 2017-03-28 | DRG: 475 | Disposition: A | Discharge status: Home / Self Care | Payer: Medicaid Other | Attending: Family Medicine | Admitting: Family Medicine

## 2017-03-24 ENCOUNTER — Emergency Department (HOSPITAL_COMMUNITY): Payer: Medicaid Other

## 2017-03-24 ENCOUNTER — Encounter (HOSPITAL_COMMUNITY): Payer: Self-pay

## 2017-03-24 DIAGNOSIS — E11628 Type 2 diabetes mellitus with other skin complications: Secondary | ICD-10-CM

## 2017-03-24 DIAGNOSIS — E1151 Type 2 diabetes mellitus with diabetic peripheral angiopathy without gangrene: Secondary | ICD-10-CM | POA: Diagnosis present

## 2017-03-24 DIAGNOSIS — Z880 Allergy status to penicillin: Secondary | ICD-10-CM | POA: Diagnosis not present

## 2017-03-24 DIAGNOSIS — E785 Hyperlipidemia, unspecified: Secondary | ICD-10-CM | POA: Diagnosis present

## 2017-03-24 DIAGNOSIS — E1165 Type 2 diabetes mellitus with hyperglycemia: Secondary | ICD-10-CM

## 2017-03-24 DIAGNOSIS — T8740 Infection of amputation stump, unspecified extremity: Secondary | ICD-10-CM | POA: Diagnosis not present

## 2017-03-24 DIAGNOSIS — E871 Hypo-osmolality and hyponatremia: Secondary | ICD-10-CM | POA: Diagnosis present

## 2017-03-24 DIAGNOSIS — L03031 Cellulitis of right toe: Secondary | ICD-10-CM | POA: Diagnosis not present

## 2017-03-24 DIAGNOSIS — F17210 Nicotine dependence, cigarettes, uncomplicated: Secondary | ICD-10-CM | POA: Diagnosis present

## 2017-03-24 DIAGNOSIS — Y835 Amputation of limb(s) as the cause of abnormal reaction of the patient, or of later complication, without mention of misadventure at the time of the procedure: Secondary | ICD-10-CM | POA: Diagnosis present

## 2017-03-24 DIAGNOSIS — Z794 Long term (current) use of insulin: Secondary | ICD-10-CM

## 2017-03-24 DIAGNOSIS — E1140 Type 2 diabetes mellitus with diabetic neuropathy, unspecified: Secondary | ICD-10-CM | POA: Diagnosis present

## 2017-03-24 DIAGNOSIS — I2510 Atherosclerotic heart disease of native coronary artery without angina pectoris: Secondary | ICD-10-CM | POA: Diagnosis present

## 2017-03-24 DIAGNOSIS — Z86718 Personal history of other venous thrombosis and embolism: Secondary | ICD-10-CM

## 2017-03-24 DIAGNOSIS — L97519 Non-pressure chronic ulcer of other part of right foot with unspecified severity: Secondary | ICD-10-CM | POA: Diagnosis present

## 2017-03-24 DIAGNOSIS — Z7982 Long term (current) use of aspirin: Secondary | ICD-10-CM

## 2017-03-24 DIAGNOSIS — E1169 Type 2 diabetes mellitus with other specified complication: Secondary | ICD-10-CM | POA: Diagnosis not present

## 2017-03-24 DIAGNOSIS — M86171 Other acute osteomyelitis, right ankle and foot: Secondary | ICD-10-CM | POA: Diagnosis present

## 2017-03-24 DIAGNOSIS — E11621 Type 2 diabetes mellitus with foot ulcer: Secondary | ICD-10-CM | POA: Diagnosis present

## 2017-03-24 DIAGNOSIS — I10 Essential (primary) hypertension: Secondary | ICD-10-CM | POA: Diagnosis not present

## 2017-03-24 DIAGNOSIS — Z79899 Other long term (current) drug therapy: Secondary | ICD-10-CM | POA: Diagnosis not present

## 2017-03-24 DIAGNOSIS — L0390 Cellulitis, unspecified: Secondary | ICD-10-CM | POA: Diagnosis present

## 2017-03-24 HISTORY — DX: Hyperlipidemia, unspecified: E78.5

## 2017-03-24 HISTORY — DX: Osteomyelitis, unspecified: M86.9

## 2017-03-24 HISTORY — DX: Essential (primary) hypertension: I10

## 2017-03-24 HISTORY — DX: Atherosclerotic heart disease of native coronary artery without angina pectoris: I25.10

## 2017-03-24 LAB — COMPREHENSIVE METABOLIC PANEL
ALT: 24 U/L (ref 17–63)
ANION GAP: 11 (ref 5–15)
AST: 36 U/L (ref 15–41)
Albumin: 3.7 g/dL (ref 3.5–5.0)
Alkaline Phosphatase: 84 U/L (ref 38–126)
BILIRUBIN TOTAL: 2.5 mg/dL — AB (ref 0.3–1.2)
BUN: 13 mg/dL (ref 6–20)
CALCIUM: 8.9 mg/dL (ref 8.9–10.3)
CO2: 23 mmol/L (ref 22–32)
Chloride: 93 mmol/L — ABNORMAL LOW (ref 101–111)
Creatinine, Ser: 0.74 mg/dL (ref 0.61–1.24)
GLUCOSE: 409 mg/dL — AB (ref 65–99)
Potassium: 5.6 mmol/L — ABNORMAL HIGH (ref 3.5–5.1)
Sodium: 127 mmol/L — ABNORMAL LOW (ref 135–145)
TOTAL PROTEIN: 5.9 g/dL — AB (ref 6.5–8.1)

## 2017-03-24 LAB — CBC WITH DIFFERENTIAL/PLATELET
Basophils Absolute: 0 10*3/uL (ref 0.0–0.1)
Basophils Relative: 0 %
Eosinophils Absolute: 0.1 10*3/uL (ref 0.0–0.7)
Eosinophils Relative: 2 %
HEMATOCRIT: 47.3 % (ref 39.0–52.0)
HEMOGLOBIN: 16.6 g/dL (ref 13.0–17.0)
LYMPHS ABS: 2.2 10*3/uL (ref 0.7–4.0)
Lymphocytes Relative: 36 %
MCH: 31.1 pg (ref 26.0–34.0)
MCHC: 35.1 g/dL (ref 30.0–36.0)
MCV: 88.7 fL (ref 78.0–100.0)
MONO ABS: 0.3 10*3/uL (ref 0.1–1.0)
MONOS PCT: 5 %
NEUTROS ABS: 3.6 10*3/uL (ref 1.7–7.7)
NEUTROS PCT: 57 %
Platelets: 212 10*3/uL (ref 150–400)
RBC: 5.33 MIL/uL (ref 4.22–5.81)
RDW: 13 % (ref 11.5–15.5)
WBC: 6.3 10*3/uL (ref 4.0–10.5)

## 2017-03-24 LAB — POTASSIUM: Potassium: 4.9 mmol/L (ref 3.5–5.1)

## 2017-03-24 LAB — SEDIMENTATION RATE: SED RATE: 17 mm/h — AB (ref 0–16)

## 2017-03-24 LAB — I-STAT CG4 LACTIC ACID, ED: LACTIC ACID, VENOUS: 1.27 mmol/L (ref 0.5–1.9)

## 2017-03-24 LAB — C-REACTIVE PROTEIN: CRP: 2.2 mg/dL — AB (ref <1.0)

## 2017-03-24 MED ORDER — FENTANYL CITRATE (PF) 100 MCG/2ML IJ SOLN
25.0000 ug | Freq: Once | INTRAMUSCULAR | Status: AC
  Administered 2017-03-24: 25 ug via INTRAVENOUS
  Filled 2017-03-24: qty 2

## 2017-03-24 NOTE — ED Triage Notes (Signed)
Pt reports having amputation of right great toe that he believes is infected. He reports severe pain with drainage with a foul odor. Denies fevers or chills.

## 2017-03-24 NOTE — ED Provider Notes (Signed)
[facility]-EMERGENCY DEPT Provider Note   CSN: [id number] Arrival date & time: 03/24/17  1950    History   Chief Complaint Chief Complaint  Patient presents with  . Foot Pain    HPI Antonio Oki. is a 61 y.o. male.  61 year old male with a history of diabetes mellitus presents to the emergency department for evaluation of pain and drainage from the stump of his right great toe amputation. He states that he hit his stump on a bedpost causing it to dehisce. Weeks ago. He has been managing the wound with Dial soap soaks and topical Silvadene cream without improvement. Patient has noted a foul smell coming from the wound. He reports worsening pain at the area. He describes the pain as constant and throbbing. Symptoms associated with some mild edema, per patient. He has not taken anything for his pain, only his daily aspirin tablets. He denies fevers, chills, and red streaking up his leg.  Amputation performed by Dr. Lajoyce Corners on 12/30/2015 after failed conservative management of L toe abscess.   The history is provided by the patient. No language interpreter was used.  Foot Pain     Past Medical History:  Diagnosis Date  . Diabetes mellitus without complication (HCC)   . Hyperlipidemia   . Hypertension     Patient Active Problem List   Diagnosis Date Noted  . Abscess and cellulitis   . Edema   . Wound infection   . Cellulitis 12/28/2015    Past Surgical History:  Procedure Laterality Date  . CARDIAC CATHETERIZATION    . Great toe amputation right.    . I&D EXTREMITY Left 12/30/2015   Procedure: IRRIGATION AND DEBRIDEMENT LEFT FOOT, TRANSMETATARSAL AMPUTATION WITH APPLICATION OF ANTIBIOTIC BEADS AND WOUND VAC;  Surgeon: Nadara Mustard, MD;  Location: [facility] OR;  Service: Orthopedics;  Laterality: Left;  . TONSILLECTOMY         Home Medications    Prior to Admission medications   Medication Sig Start Date End Date Taking? Authorizing Provider  atorvastatin (LIPITOR) 40 MG  tablet Take 40 mg by mouth daily at 6 PM.    Historical Provider, MD  carvedilol (COREG) 6.25 MG tablet Take 6.25 mg by mouth 2 (two) times daily with a meal.    Historical Provider, MD  clopidogrel (PLAVIX) 75 MG tablet Take 75 mg by mouth daily.    Historical Provider, MD  HYDROcodone-acetaminophen (NORCO/VICODIN) 5-325 MG tablet Take 1 tablet by mouth every 6 (six) hours as needed for moderate pain. 12/31/15   Asiyah Mayra Reel, MD  insulin glargine (LANTUS) 100 UNIT/ML injection Inject 30 Units into the skin at bedtime.    Historical Provider, MD  lisinopril (PRINIVIL,ZESTRIL) 5 MG tablet Take 5 mg by mouth 2 (two) times daily.    Historical Provider, MD  oxyCODONE-acetaminophen (ROXICET) 5-325 MG tablet Take 1-2 tablets by mouth every 4 (four) hours as needed. 12/31/15   Kathryne Hitch, MD  pregabalin (LYRICA) 100 MG capsule Take 100 mg by mouth 3 (three) times daily.    Historical Provider, MD    Family History Family History  Problem Relation Age of Onset  . Diabetes Mother   . Emphysema Father     Social History Social History  Substance Use Topics  . Smoking status: Current Every Day Smoker    Packs/day: 2.00    Types: Cigarettes  . Smokeless tobacco: Never Used  . Alcohol use No     Allergies   Iodinated diagnostic agents and  Penicillins   Review of Systems Review of Systems Ten systems reviewed and are negative for acute change, except as noted in the HPI.    Physical Exam Updated Vital Signs BP (!) 149/76 (BP Location: Left Arm)   Pulse 91   Temp 98.4 F (36.9 C) (Oral)   Resp 18   Ht 6' (1.829 m)   Wt 104.3 kg   SpO2 96%   BMI 31.19 kg/m   Physical Exam  Constitutional: He is oriented to person, place, and time. He appears well-developed and well-nourished. No distress.  Nontoxic and in NAD  HENT:  Head: Normocephalic and atraumatic.  Eyes: Conjunctivae and EOM are normal. No scleral icterus.  Neck: Normal range of motion.  Cardiovascular:  Normal rate, regular rhythm and intact distal pulses.   DP pulse intact in the the RLE. Capillary refill intact in all remaining digits of the RLE.  Pulmonary/Chest: Effort normal. No respiratory distress. He has no wheezes.  Respirations even and unlabored  Musculoskeletal: Normal range of motion.  Open sore to the stump of R great toe amputation. Surrounding pallor transitioning to erythema at the outer border. Foul smelling. No active drainage. Associated TTP. See images below.  Neurological: He is alert and oriented to person, place, and time. He exhibits normal muscle tone. Coordination normal.  Sensation to light touch intact in the RLE  Skin: Skin is warm and dry. No rash noted. He is not diaphoretic.  See MSK  Psychiatric: He has a normal mood and affect. His behavior is normal.  Nursing note and vitals reviewed.    ED Treatments / Results  Labs (all labs ordered are listed, but only abnormal results are displayed) Labs Reviewed  COMPREHENSIVE METABOLIC PANEL - Abnormal; Notable for the following:       Result Value   Sodium 127 (*)    Potassium 5.6 (*)    Chloride 93 (*)    Glucose, Bld 409 (*)    Total Protein 5.9 (*)    Total Bilirubin 2.5 (*)    All other components within normal limits  SEDIMENTATION RATE - Abnormal; Notable for the following:    Sed Rate 17 (*)    All other components within normal limits  C-REACTIVE PROTEIN - Abnormal; Notable for the following:    CRP 2.2 (*)    All other components within normal limits  AEROBIC/ANAEROBIC CULTURE (SURGICAL/DEEP WOUND)  CBC WITH DIFFERENTIAL/PLATELET  POTASSIUM  I-STAT CG4 LACTIC ACID, ED  I-STAT CG4 LACTIC ACID, ED    EKG  EKG Interpretation None       Radiology Dg Foot Complete Right  Result Date: 03/24/2017 CLINICAL DATA:  Probable infection at the amputation site. EXAM: RIGHT FOOT COMPLETE - 3+ VIEW COMPARISON:  12/03/2014 FINDINGS: Prior first digit amputation at the first MTP joint. There is a  soft tissue ulcer at the amputation stump. There is loss of bony cortex at the medial aspect of the first metatarsal head, with mild underlying sclerosis. This is suspicious for osteomyelitis. IMPRESSION: Suspicious for osteomyelitis involving the first metatarsal head. There also is an overlying soft tissue ulcer of the amputation stump. Electronically Signed   By: Ellery Plunk M.D.   On: 03/24/2017 21:56    Procedures Procedures (including critical care time)  Medications Ordered in ED Medications  fentaNYL (SUBLIMAZE) injection 25 mcg (not administered)           Initial Impression / Assessment and Plan / ED Course  I have reviewed the triage vital  signs and the nursing notes.  Pertinent labs & imaging results that were available during my care of the patient were reviewed by me and considered in my medical decision making (see chart for details).     Patient to be admitted to the family medicine teaching service for management of likely osteomyelitis seen on x-ray. Orthopedics has been consulted. They will evaluate the patient in the morning. Patient aware of and agreeable to plan.   Final Clinical Impressions(s) / ED Diagnoses   Final diagnoses:  Acute osteomyelitis of metatarsal bone of right foot [facility])    New Prescriptions New Prescriptions   No medications on file     Antonio Madura, PA-C 03/24/17 2252    Shaune Pollack, MD 03/26/17 1259

## 2017-03-24 NOTE — H&P (Signed)
Family Medicine Teaching [facility] Admission History and Physical Service Pager: [phone]  Patient name: Antonio [name]. Medical record number: [id number] Date of birth: 10/09/1956 Age: 61 y.o. Gender: male  Primary Care Provider: Dema Severin, NP - [place] Consultants: orthopedics Code Status: FULL  Chief Complaint: right great toe stump infection  Assessment and Plan: Antonio Debella. is a 61 y.o. male presenting with Right great toe stump infection. PMH is significant for T2DM (Last A1c 11.7 in 12/2015), HTN, HLD, chronic hyponatremia, history DVT's per patient, CAD/PAD, tobacco use  Cellulitis/Osteomyelitis: Lactic Acid 1.27 at admission. Afebrile with stable vitals. qSOFA score of 0.  XR Right foot suspicious for osteo of 1st metatarsal head. H/o of similar presentation a year ago on the left foot. Had 1st toe amputation on right on 12/10/2014. Has been following with wound clinic for ulcer since surgery. Most likely poor wound healing from uncontrolled diabetes. - Admit FPTS, attending Dr. Deirdre Priest - MR R foot - WOC consult - would culture pending -start Vanc/Levo (allergic to penicillins and want pseudomonas coverage) - orthopedics consulted by ED, appreciate recs -monitor vitals per floor protocol   Diabetes, poorly controlled: Unclear which medications patient is meant to be taking per chart review. Home medications per chart include 20u lantus nightly Tradjenta 5 mg, glipizide 5 mg BID, however patient endorses only taking metformin BID which is not seen in epic. Last A1c was 11.7 in 12/2015. Need to monitor in setting of infection as sugars will be elevated.  - holding home regimen - will start Lantus at 5 U; will likely need titration up - moderate Insulin Sliding Scale -repeat A1c -CBG monitoring AC +qhs  Hyponatremia, chronic: Sodium 127 at admission, noted in previous hospital stay 1 year ago as well.  Corrects to 132 with hyperglycemia. Previously  worked up at admission one year ago with serum osm, urine osm, and urine sodium WNL at that time. - IVNS at 125 cc/hr - AM BMP  Hypertension, currently normotensive: Patient states he does not know his home medication, but endorses taking a pill daily. Per chart review, patient previously on Coreg 6.25mg  BID, lisinopril 10 mg BID, and metoprolol 25 mg also seen on this list.  Uncertain of most up-to-date medication list. - Hold home med - monitor BP  CAD/PAD/HLD - patient notes history of stent placement in left leg, balloon procedure in right leg. Endorses claudication. No lipid panel per chart.  Previously was on atorvastatin 40 mg daily, unclear whether he is still taking this. - can restart statin in AM  Tobacco Abuse: Smokes 2ppd. Refuses nicotine patch at admission. - Smoking cessation counseling during hospitalization  DVTs: History reported by patient on year ago, not endorsed this admission. Takes ASA 81 mg daily. Previous notes indicate he was on plavix 75 mg.  - Holding ASA, Plavix due to possible surgery - Heparin SQ  FEN/GI: IVNS at 125 cc/hr, NPO at midnight Prophylaxis: heparin  Disposition: Admit to FPTS for suspected osteomyelitis  History of Present Illness:  Antonio Sermon. is a 61 y.o. male presenting with right great toe stump pain and odor.  He was in his usual state of health until 3 weeks ago when he noted increased pain with ambulation in the stump of the right great toe. No fevers. He endorses foot swelling and foul odor. He has seen clear serous drainage. He is unsure if he has seen pus draining from the area. He had this toe removed ~2 years ago  per patient.  He also endorses a blister over his left great toe stump which began about a week ago.  Denies dysnpnea, cough, dyuria, abdominal pain, N/V/D/C or changes in urination.  Review Of Systems: Per HPI.  ROS  Patient Active Problem List   Diagnosis Date Noted  . Abscess and cellulitis   . Edema    . Wound infection   . Cellulitis 12/28/2015    Past Medical History: Past Medical History:  Diagnosis Date  . Diabetes mellitus without complication (HCC)   . Hyperlipidemia   . Hypertension     Past Surgical History: Past Surgical History:  Procedure Laterality Date  . CARDIAC CATHETERIZATION    . Great toe amputation right.    . I&D EXTREMITY Left 12/30/2015   Procedure: IRRIGATION AND DEBRIDEMENT LEFT FOOT, TRANSMETATARSAL AMPUTATION WITH APPLICATION OF ANTIBIOTIC BEADS AND WOUND VAC;  Surgeon: Nadara Mustard, MD;  Location: [facility] OR;  Service: Orthopedics;  Laterality: Left;  . TONSILLECTOMY      Social History: Social History  Substance Use Topics  . Smoking status: Current Every Day Smoker    Packs/day: 2.00    Types: Cigarettes  . Smokeless tobacco: Never Used  . Alcohol use Yes     Comment: beers occasionally   Additional social history: basement of parents house Please also refer to relevant sections of EMR.  Family History: Family History  Problem Relation Age of Onset  . Diabetes Mother   . Emphysema Father     Allergies and Medications: Allergies  Allergen Reactions  . Iodinated Diagnostic Agents Hives, Rash and Other (See Comments)    Blisters Staph  . Penicillins Other (See Comments)    Childhood allergy    No current facility-administered medications on file prior to encounter.    Current Outpatient Prescriptions on File Prior to Encounter  Medication Sig Dispense Refill  . HYDROcodone-acetaminophen (NORCO/VICODIN) 5-325 MG tablet Take 1 tablet by mouth every 6 (six) hours as needed for moderate pain. 30 tablet 0  . insulin glargine (LANTUS) 100 UNIT/ML injection Inject 20 Units into the skin at bedtime.     Marland Kitchen oxyCODONE-acetaminophen (ROXICET) 5-325 MG tablet Take 1-2 tablets by mouth every 4 (four) hours as needed. (Patient taking differently: Take 1-2 tablets by mouth every 4 (four) hours as needed for moderate pain. ) 60 tablet 0     Objective: BP (!) 149/87 (BP Location: Right Arm)   Pulse 83   Temp 98.2 F (36.8 C) (Oral)   Resp 18   Ht 6' (1.829 m)   Wt 230 lb (104.3 kg)   SpO2 93%   BMI 31.19 kg/m  Exam: General: NAD, rests comfortably in bed, pleasant Eyes: EOMI, PERRL, no scleral icterus, no conjunctival pallor or injection ENTM: MMM, no pharyngeal erythema or exudate Neck: supple, no LAD Cardiovascular: RRR, no m/r/g Respiratory: CTA bil, no W/R/R Gastrointestinal: soft, nontender, nondistended MSK: L foot s/p 5 toes amputated with small blister over great toe stump and eschar. Right foot s/p great toe amputation with ulcer containing +granulation tissue and discharge noted over amputation site. LE are warm and well-perfused. UE normal. No surrounding erythema or warmth around ulcers. Tender to palpation.  Derm: as above, otherwise no rashes or lesions Neuro: CN II-XII grossly intact, strength intact Psych: AAOx3, appropriate affect  Labs and Imaging:  Results for orders placed or performed during the hospital encounter of 03/24/17  CBC with Differential  Result Value Ref Range   WBC 6.3 4.0 - 10.5 K/uL  RBC 5.33 4.22 - 5.81 MIL/uL   Hemoglobin 16.6 13.0 - 17.0 g/dL   HCT 16.1 09.6 - 04.5 %   MCV 88.7 78.0 - 100.0 fL   MCH 31.1 26.0 - 34.0 pg   MCHC 35.1 30.0 - 36.0 g/dL   RDW 40.9 81.1 - 91.4 %   Platelets 212 150 - 400 K/uL   Neutrophils Relative % 57 %   Neutro Abs 3.6 1.7 - 7.7 K/uL   Lymphocytes Relative 36 %   Lymphs Abs 2.2 0.7 - 4.0 K/uL   Monocytes Relative 5 %   Monocytes Absolute 0.3 0.1 - 1.0 K/uL   Eosinophils Relative 2 %   Eosinophils Absolute 0.1 0.0 - 0.7 K/uL   Basophils Relative 0 %   Basophils Absolute 0.0 0.0 - 0.1 K/uL  Comprehensive metabolic panel  Result Value Ref Range   Sodium 127 (L) 135 - 145 mmol/L   Potassium 5.6 (H) 3.5 - 5.1 mmol/L   Chloride 93 (L) 101 - 111 mmol/L   CO2 23 22 - 32 mmol/L   Glucose, Bld 409 (H) 65 - 99 mg/dL   BUN 13 6 - 20  mg/dL   Creatinine, Ser 7.82 0.61 - 1.24 mg/dL   Calcium 8.9 8.9 - 95.6 mg/dL   Total Protein 5.9 (L) 6.5 - 8.1 g/dL   Albumin 3.7 3.5 - 5.0 g/dL   AST 36 15 - 41 U/L   ALT 24 17 - 63 U/L   Alkaline Phosphatase 84 38 - 126 U/L   Total Bilirubin 2.5 (H) 0.3 - 1.2 mg/dL   GFR calc non Af Amer >60 >60 mL/min   GFR calc Af Amer >60 >60 mL/min   Anion gap 11 5 - 15  Sedimentation rate  Result Value Ref Range   Sed Rate 17 (H) 0 - 16 mm/hr  C-reactive protein  Result Value Ref Range   CRP 2.2 (H) <1.0 mg/dL  Potassium  Result Value Ref Range   Potassium 4.9 3.5 - 5.1 mmol/L  Glucose, capillary  Result Value Ref Range   Glucose-Capillary 290 (H) 65 - 99 mg/dL  I-Stat CG4 Lactic Acid, ED  Result Value Ref Range   Lactic Acid, Venous 1.27 0.5 - 1.9 mmol/L   Dg Foot Complete Right 03/24/2017 CLINICAL DATA:  Probable infection at the amputation site. EXAM: RIGHT FOOT COMPLETE - 3+ VIEW COMPARISON:  12/03/2014 FINDINGS: Prior first digit amputation at the first MTP joint. There is a soft tissue ulcer at the amputation stump. There is loss of bony cortex at the medial aspect of the first metatarsal head, with mild underlying sclerosis. This is suspicious for osteomyelitis.  IMPRESSION: Suspicious for osteomyelitis involving the first metatarsal head. There also is an overlying soft tissue ulcer of the amputation stump.    Howard Pouch, MD 03/25/2017, 2:27 AM PGY-1, [facility] Family Medicine FPTS Intern pager: [phone], text pages welcome  FPTS Upper-Level Resident Addendum  I have independently interviewed and examined the patient. I have discussed the above with the original author and agree with their documentation. My edits for correction/addition/clarification are in pink. Please see also any attending notes.   Pincus Large, DO PGY-3, [facility] FPTS Service pager: [phone] (text pages welcome through [facility])

## 2017-03-24 NOTE — ED Notes (Signed)
Patient transported to X-ray 

## 2017-03-24 NOTE — ED Notes (Signed)
Attempted IV X 2 unsuccessful.

## 2017-03-25 ENCOUNTER — Inpatient Hospital Stay (HOSPITAL_COMMUNITY): Payer: Medicaid Other

## 2017-03-25 ENCOUNTER — Encounter (HOSPITAL_COMMUNITY): Payer: Self-pay | Admitting: Obstetrics and Gynecology

## 2017-03-25 ENCOUNTER — Other Ambulatory Visit (INDEPENDENT_AMBULATORY_CARE_PROVIDER_SITE_OTHER): Payer: Self-pay | Admitting: Family

## 2017-03-25 DIAGNOSIS — M86171 Other acute osteomyelitis, right ankle and foot: Secondary | ICD-10-CM

## 2017-03-25 DIAGNOSIS — M869 Osteomyelitis, unspecified: Secondary | ICD-10-CM

## 2017-03-25 DIAGNOSIS — L03031 Cellulitis of right toe: Secondary | ICD-10-CM

## 2017-03-25 HISTORY — DX: Osteomyelitis, unspecified: M86.9

## 2017-03-25 LAB — GLUCOSE, CAPILLARY
GLUCOSE-CAPILLARY: 280 mg/dL — AB (ref 65–99)
Glucose-Capillary: 290 mg/dL — ABNORMAL HIGH (ref 65–99)
Glucose-Capillary: 235 mg/dL — ABNORMAL HIGH (ref 65–99)
Glucose-Capillary: 210 mg/dL — ABNORMAL HIGH (ref 65–99)
Glucose-Capillary: 244 mg/dL — ABNORMAL HIGH (ref 65–99)

## 2017-03-25 LAB — BASIC METABOLIC PANEL
Anion gap: 9 (ref 5–15)
BUN: 12 mg/dL (ref 6–20)
CHLORIDE: 97 mmol/L — AB (ref 101–111)
CO2: 27 mmol/L (ref 22–32)
Calcium: 9 mg/dL (ref 8.9–10.3)
Creatinine, Ser: 0.66 mg/dL (ref 0.61–1.24)
GFR calc Af Amer: >60 mL/min (ref >60)
GLUCOSE: 347 mg/dL — AB (ref 65–99)
Potassium: 4.3 mmol/L (ref 3.5–5.1)
Sodium: 133 mmol/L — ABNORMAL LOW (ref 135–145)

## 2017-03-25 LAB — PROTIME-INR
INR: 0.9
Prothrombin Time: 12.2 seconds (ref 11.4–15.2)

## 2017-03-25 LAB — CBC
HEMATOCRIT: 43.4 % (ref 39.0–52.0)
Hemoglobin: 14.7 g/dL (ref 13.0–17.0)
MCH: 30.2 pg (ref 26.0–34.0)
MCHC: 33.9 g/dL (ref 30.0–36.0)
MCV: 89.1 fL (ref 78.0–100.0)
PLATELETS: 175 10*3/uL (ref 150–400)
RBC: 4.87 MIL/uL (ref 4.22–5.81)
RDW: 12.9 % (ref 11.5–15.5)
WBC: 5.2 10*3/uL (ref 4.0–10.5)

## 2017-03-25 LAB — HIV ANTIBODY (ROUTINE TESTING W REFLEX): HIV SCREEN 4TH GENERATION: NONREACTIVE

## 2017-03-25 MED ORDER — ATORVASTATIN CALCIUM 40 MG PO TABS
40.0000 mg | ORAL_TABLET | Freq: Every day | ORAL | Status: DC
  Administered 2017-03-25 – 2017-03-27 (×3): 40 mg via ORAL
  Filled 2017-03-25 (×3): qty 1

## 2017-03-25 MED ORDER — GADOBENATE DIMEGLUMINE 529 MG/ML IV SOLN
20.0000 mL | Freq: Once | INTRAVENOUS | Status: AC | PRN
  Administered 2017-03-25: 20 mL via INTRAVENOUS

## 2017-03-25 MED ORDER — INSULIN ASPART 100 UNIT/ML ~~LOC~~ SOLN
0.0000 [IU] | Freq: Three times a day (TID) | SUBCUTANEOUS | Status: DC
  Administered 2017-03-25: 8 [IU] via SUBCUTANEOUS
  Administered 2017-03-25 (×2): 5 [IU] via SUBCUTANEOUS
  Administered 2017-03-26: 3 [IU] via SUBCUTANEOUS
  Administered 2017-03-26 (×2): 8 [IU] via SUBCUTANEOUS
  Administered 2017-03-27: 5 [IU] via SUBCUTANEOUS
  Administered 2017-03-27: 8 [IU] via SUBCUTANEOUS
  Administered 2017-03-28: 5 [IU] via SUBCUTANEOUS
  Administered 2017-03-28: 3 [IU] via SUBCUTANEOUS

## 2017-03-25 MED ORDER — INSULIN ASPART 100 UNIT/ML ~~LOC~~ SOLN
0.0000 [IU] | Freq: Every day | SUBCUTANEOUS | Status: DC
  Administered 2017-03-25 – 2017-03-27 (×3): 2 [IU] via SUBCUTANEOUS

## 2017-03-25 MED ORDER — IBUPROFEN 200 MG PO TABS
600.0000 mg | ORAL_TABLET | Freq: Four times a day (QID) | ORAL | Status: DC | PRN
  Administered 2017-03-25 – 2017-03-27 (×7): 600 mg via ORAL
  Filled 2017-03-25 (×7): qty 3

## 2017-03-25 MED ORDER — INSULIN ASPART 100 UNIT/ML ~~LOC~~ SOLN
0.0000 [IU] | Freq: Every day | SUBCUTANEOUS | Status: DC
  Administered 2017-03-25: 3 [IU] via SUBCUTANEOUS

## 2017-03-25 MED ORDER — VANCOMYCIN HCL 10 G IV SOLR
2000.0000 mg | Freq: Once | INTRAVENOUS | Status: DC
  Filled 2017-03-25: qty 2000

## 2017-03-25 MED ORDER — ACETAMINOPHEN 325 MG PO TABS: 650.0000 mg | ORAL_TABLET | Freq: Four times a day (QID) | ORAL | Status: DC | PRN

## 2017-03-25 MED ORDER — VANCOMYCIN HCL IN DEXTROSE 1-5 GM/200ML-% IV SOLN
1000.0000 mg | Freq: Three times a day (TID) | INTRAVENOUS | Status: DC
  Administered 2017-03-25 – 2017-03-27 (×6): 1000 mg via INTRAVENOUS
  Filled 2017-03-25 (×8): qty 200

## 2017-03-25 MED ORDER — SODIUM CHLORIDE 0.9 % IV SOLN
INTRAVENOUS | Status: DC
  Administered 2017-03-25: 125 mL/h via INTRAVENOUS

## 2017-03-25 MED ORDER — HEPARIN SODIUM (PORCINE) 5000 UNIT/ML IJ SOLN
5000.0000 [IU] | Freq: Three times a day (TID) | INTRAMUSCULAR | Status: AC
  Administered 2017-03-25 – 2017-03-26 (×5): 5000 [IU] via SUBCUTANEOUS
  Filled 2017-03-25 (×5): qty 1

## 2017-03-25 MED ORDER — ACETAMINOPHEN 650 MG RE SUPP: 650.0000 mg | Freq: Four times a day (QID) | RECTAL | Status: DC | PRN

## 2017-03-25 MED ORDER — LEVOFLOXACIN IN D5W 750 MG/150ML IV SOLN
750.0000 mg | INTRAVENOUS | Status: DC
  Administered 2017-03-25 – 2017-03-26 (×2): 750 mg via INTRAVENOUS
  Filled 2017-03-25 (×3): qty 150

## 2017-03-25 MED ORDER — INSULIN ASPART 100 UNIT/ML ~~LOC~~ SOLN: 0.0000 [IU] | Freq: Three times a day (TID) | SUBCUTANEOUS | Status: DC

## 2017-03-25 MED ORDER — INSULIN GLARGINE 100 UNIT/ML ~~LOC~~ SOLN
5.0000 [IU] | Freq: Every day | SUBCUTANEOUS | Status: DC
  Administered 2017-03-25: 5 [IU] via SUBCUTANEOUS
  Filled 2017-03-25 (×2): qty 0.05

## 2017-03-25 NOTE — Care Management Note (Signed)
Case Management Note  Patient Details  Name: Antonio [name]. MRN: [id number] Date of Birth: [birth year]  Subjective/Objective:                 Right great toe stump infection, had 1st toe amputation on right on 12/10/2014. Has been following with [facility] wound clinic for ulcer since surgery. Most likely poor wound healing from uncontrolled diabetes. Functionally independent pta, lives with parents, in their basement. Patient has access to cane, RW and WC at home. Continues IV Abx, plan for further amputation of site on Wednesday by Dr Lajoyce Corners.    Action/Plan:   Expected Discharge Date:                  Expected Discharge Plan:     In-House Referral:     Discharge planning Services  CM Consult  Post Acute Care Choice:    Choice offered to:     DME Arranged:    DME Agency:     HH Arranged:    HH Agency:     Status of Service:  In process, will continue to follow  If discussed at Long Length of Stay Meetings, dates discussed:    Additional Comments:  Lawerance Sabal, RN 03/25/2017, 11:19 AM

## 2017-03-25 NOTE — Progress Notes (Signed)
OT Screen  Note  Patient Details Name: Antonio [name]. MRN: [id number] DOB: May 27, 1956   Cancelled Treatment:    Reason Eval/Treat Not Completed: OT screened, no needs identified, will sign off  Felecia Shelling   OTR/L Pager: [phone] Office: [phone] .  03/25/2017, 9:34 AM

## 2017-03-25 NOTE — Consult Note (Signed)
ORTHOPAEDIC CONSULTATION  REQUESTING PHYSICIAN: Carney Living, MD  Chief Complaint: Draining ulcer right first metatarsal head.  HPI: Antonio [name]. is a 61 y.o. male who presents with a three-week history of a draining ulcer right first metatarsal head. Patient states he struck his foot on the bed about 3 weeks ago developing an ulcer which has not resolved. Patient complains of a foul-smelling draining ulcer. Patient also has a callus over the first metatarsal left transmetatarsal amputation.  Past Medical History:  Diagnosis Date  . Diabetes mellitus without complication (HCC)   . Hyperlipidemia   . Hypertension    Past Surgical History:  Procedure Laterality Date  . CARDIAC CATHETERIZATION    . Great toe amputation right.    . I&D EXTREMITY Left 12/30/2015   Procedure: IRRIGATION AND DEBRIDEMENT LEFT FOOT, TRANSMETATARSAL AMPUTATION WITH APPLICATION OF ANTIBIOTIC BEADS AND WOUND VAC;  Surgeon: Nadara Mustard, MD;  Location: [facility] OR;  Service: Orthopedics;  Laterality: Left;  . TONSILLECTOMY     Social History   Social History  . Marital status: Divorced    Spouse name: N/A  . Number of children: N/A  . Years of education: N/A   Social History Main Topics  . Smoking status: Current Every Day Smoker    Packs/day: 2.00    Types: Cigarettes  . Smokeless tobacco: Never Used  . Alcohol use Yes     Comment: beers occasionally  . Drug use: No  . Sexual activity: Not Asked   Other Topics Concern  . None   Social History Narrative  . None   Family History  Problem Relation Age of Onset  . Diabetes Mother   . Emphysema Father    - negative except otherwise stated in the family history section Allergies  Allergen Reactions  . Iodinated Diagnostic Agents Hives, Rash and Other (See Comments)    Blisters Staph  . Penicillins Other (See Comments)    Childhood allergy    Prior to Admission medications   Medication Sig Start Date End Date Taking?  Authorizing Provider  HYDROcodone-acetaminophen (NORCO/VICODIN) 5-325 MG tablet Take 1 tablet by mouth every 6 (six) hours as needed for moderate pain. 12/31/15  Yes Asiyah Mayra Reel, MD  insulin glargine (LANTUS) 100 UNIT/ML injection Inject 20 Units into the skin at bedtime.    Yes Historical Provider, MD  oxyCODONE-acetaminophen (ROXICET) 5-325 MG tablet Take 1-2 tablets by mouth every 4 (four) hours as needed. Patient taking differently: Take 1-2 tablets by mouth every 4 (four) hours as needed for moderate pain.  12/31/15  Yes Kathryne Hitch, MD   Dg Foot Complete Right  Result Date: 03/24/2017 CLINICAL DATA:  Probable infection at the amputation site. EXAM: RIGHT FOOT COMPLETE - 3+ VIEW COMPARISON:  12/03/2014 FINDINGS: Prior first digit amputation at the first MTP joint. There is a soft tissue ulcer at the amputation stump. There is loss of bony cortex at the medial aspect of the first metatarsal head, with mild underlying sclerosis. This is suspicious for osteomyelitis. IMPRESSION: Suspicious for osteomyelitis involving the first metatarsal head. There also is an overlying soft tissue ulcer of the amputation stump. Electronically Signed   By: Ellery Plunk M.D.   On: 03/24/2017 21:56   - pertinent xrays, CT, MRI studies were reviewed and independently interpreted  Positive ROS: All other systems have been reviewed and were otherwise negative with the exception of those mentioned in the HPI and as above.  Physical Exam: General: Alert, no  acute distress Psychiatric: Patient is competent for consent with normal mood and affect Lymphatic: No axillary or cervical lymphadenopathy Cardiovascular: No pedal edema Respiratory: No cyanosis, no use of accessory musculature GI: No organomegaly, abdomen is soft and non-tender  Skin: Examination patient has some mild cellulitis around the first metatarsal head right foot. He has an open draining ulcer that probes to bone.   Neurologic:  Patient does not have protective sensation bilateral lower extremities.   MUSCULOSKELETAL:  Examination patient still has elevated blood glucose levels. He does not have a palpable dorsalis pedis pulse. He has a foul smelling draining ulcer that probes to bone first metatarsal head right foot. Radiographs show destructive changes of the first metatarsal head consistent with chronic osteomyelitis.  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  Plan: Plan: We'll plan for right foot first ray amputation on Wednesday. Risks and benefits were discussed including risk of the wound not healing. Patient states he understands wishes to proceed at this time.  Thank you for the consult and the opportunity to see [name], MD [facility] [phone] 6:39 AM

## 2017-03-25 NOTE — Progress Notes (Signed)
Family Medicine Teaching Service Daily Progress Note Intern Pager: [phone]  Patient name: Antonio [name]. Medical record number: [id number] Date of birth: July 23, 1956 Age: 61 y.o. Gender: male  Primary Care Provider: Dema Severin, NP Consultants: Orthopedics Code Status: Full  Pt Overview and Major Events to Date:  4/29 - admit to FPTS   Assessment and Plan: Antonio Kronberg. is a 61 y.o. male presenting with Right great toe stump infection. PMH is significant for T2DM (Last A1c 11.7 in 12/2015), HTN, HLD, chronic hyponatremia, history DVT's per patient, CAD/PAD, tobacco use  Osteomyelitis R first metatarsal: Continues to be afebrile with stable vitals, no leukocytosis. Had 1st toe amputation on right on 12/10/2014. Has been following with wound clinic for ulcer since surgery. Most likely poor wound healing from uncontrolled diabetes. - MR R foot pending - WOC consult - wound culture pending - continue Vanc/Levo 4/30 -  (allergic to penicillins and want pseudomonas coverage) - orthopedics recs- plan for surgery wednesday - monitor vitals per floor protocol    Diabetes, poorly controlled: Unclear which medications patient is meant to be taking per chart review. Home medications per chart include 20u lantus nightly Tradjenta 5 mg, glipizide 5 mg BID, however patient endorses only taking metformin BID which is not seen in epic. Last A1c was 11.7 in 12/2015. Need to monitor in setting of infection as sugars will be elevated.  - holding home regimen - will start Lantus at 5 U; will likely need titration up - moderate Insulin Sliding Scale - repeat A1c -CBG monitoring AC +qhs  Hyponatremia, chronic: Sodium improved at 133 this AM from 127 at admission. Previously worked up at admission one year ago with serum osm, urine osm, and urine sodium WNL at that time. - IVNS at 125 cc/hr - AM BMP  Hypertension, currently normotensive: Patient states he does not know his home  medication, but endorses taking a pill daily. Per chart review, patient previously on Coreg 6.25mg  BID, lisinopril 10 mg BID, and metoprolol 25 mg also seen on this list.  Uncertain of most up-to-date medication list. - Hold home med - monitor BP  CAD/PAD/HLD - patient notes history of stent placement in left leg, balloon procedure in right leg. Endorses claudication. No lipid panel per chart.  Previously was on atorvastatin 40 mg daily, unclear whether he is still taking this. - restart statin  Tobacco Abuse: Smokes 2ppd. Refuses nicotine patch at admission. - Smoking cessation counseling during hospitalization  DVTs: History reported by patient on year ago, not endorsed this admission. Takes ASA 81 mg daily. Previous notes indicate he was on plavix 75 mg.  - Holding ASA, Plavix due to possible surgery - Heparin SQ  FEN/GI: IVNS at 125 cc/hr, NPO at midnight Prophylaxis: heparin  Disposition: Plan for surgery by ortho wednesday  Subjective:  Patient with no acute events overnight, afebrile, doing well this AM.  Agreeable to surgery by ortho on Wednesday.   Objective: Temp:  [98 F (36.7 C)-98.4 F (36.9 C)] 98 F (36.7 C) (04/30 1478) Pulse Rate:  [73-91] 73 (04/30 0637) Resp:  [18] 18 (04/30 0637) BP: (112-149)/(68-87) 139/70 (04/30 0637) SpO2:  [93 %-96 %] 95 % (04/30 0637) Weight:  [230 lb (104.3 kg)] 230 lb (104.3 kg) (04/29 2016) Physical Exam: General: NAD, rests comfortably, pleasant Cardiovascular: RRR, no m/r/g Respiratory: CTA bil, no W/R/R Abdomen: soft and nontender, nondistended Extremities: L foot s/p 5 toes amputated with small blister over great toe stump and eschar. Right foot  s/p great toe amputation with ulcer containing +granulation tissue and discharge noted over amputation site. LE are warm and well-perfused. UE normal. No surrounding erythema or warmth around ulcers. Tender to palpation.   Laboratory:  Recent Labs Lab 03/24/17 2026 03/25/17 0328   WBC 6.3 5.2  HGB 16.6 14.7  HCT 47.3 43.4  PLT 212 175    Recent Labs Lab 03/24/17 2026 03/24/17 2130 03/25/17 0328  NA 127*  --  133*  K 5.6* 4.9 4.3  CL 93*  --  97*  CO2 23  --  27  BUN 13  --  12  CREATININE 0.74  --  0.66  CALCIUM 8.9  --  9.0  PROT 5.9*  --   --   BILITOT 2.5*  --   --   ALKPHOS 84  --   --   ALT 24  --   --   AST 36  --   --   GLUCOSE 409*  --  347*   Imaging/Diagnostic Tests: Dg Foot Complete Right 03/24/2017 CLINICAL DATA:  Probable infection at the amputation site. EXAM: RIGHT FOOT COMPLETE - 3+ VIEW COMPARISON:  12/03/2014 FINDINGS: Prior first digit amputation at the first MTP joint. There is a soft tissue ulcer at the amputation stump. There is loss of bony cortex at the medial aspect of the first metatarsal head, with mild underlying sclerosis. This is suspicious for osteomyelitis.  IMPRESSION: Suspicious for osteomyelitis involving the first metatarsal head. There also is an overlying soft tissue ulcer of the amputation stump.    Howard Pouch, MD 03/25/2017, 10:37 AM PGY-1, [facility] Family Medicine FPTS Intern pager: [phone], text pages welcome

## 2017-03-25 NOTE — Consult Note (Addendum)
WOC consult requested prior to ortho service involovement.  X-ray is suspicious for osteomyelitis and Dr Lajoyce Corners is now following for assessment and plan of care.  He plans to take pt to the OR on Wed, according to the EMR; moist gauze dressing ordered until surgery to control odor and drainage. Please refer to their team for further plan of care. Please re-consult if further assistance is needed.  Thank-you,  Cammie Mcgee MSN, RN, CWOCN, [place], CNS [phone]

## 2017-03-25 NOTE — Progress Notes (Signed)
Inpatient Diabetes Program Recommendations  AACE/ADA: New Consensus Statement on Inpatient Glycemic Control (2015)  Target Ranges:  Prepandial:   less than 140 mg/dL      Peak postprandial:   less than 180 mg/dL (1-2 hours)      Critically ill patients:  140 - 180 mg/dL   Lab Results  Component Value Date   GLUCAP 280 (H) 03/25/2017   HGBA1C 11.7 (H) 12/28/2015    Review of Glycemic Control  Diabetes history: DM2 Outpatient Diabetes medications: Lantus 20 units QHS Current orders for Inpatient glycemic control: Lantus 5 units QD, Novolog 0-15 units tidwc and hs  Needs tighter glycemic control prior to surgery for amputation on 03/27/2017.  Inpatient Diabetes Program Recommendations:    Increase Lantus to 10 units bid Add Novolog 4 units tidwc for meal coverage insulin When NPO, change Novolog to Q4H. Will await HgbA1C results.  Thank you. Ailene Ards, RD, LDN, CDE Inpatient Diabetes Coordinator [phone]

## 2017-03-25 NOTE — Evaluation (Signed)
Physical Therapy Evaluation Patient Details Name: Antonio [name]. MRN: [id number] DOB: 05-03-1956 Today's Date: 03/25/2017   History of Present Illness  Antonio Cohenis a 61 y.o.malepresenting with Right great toe stump infection. PMH is significant for T2DM (Last A1c 11.7 in 12/2015), HTN, HLD, chronic hyponatremia, history DVT's per patient, CAD/PAD, tobacco use. Pt planned for OR on Wednesday 5/2 with Dr. Lajoyce Corners for 1st ray amputation.  Clinical Impression  Pt functioning indep however is planned for surgery on Wednesday 5/2 for further amputation of R great toe due to osteomyelitis. PT to reassess mobility s/p surgery to verify pt with no further acute PT needs or DME for d/c.    Follow Up Recommendations No PT follow up    Equipment Recommendations  None recommended by PT    Recommendations for Other Services       Precautions / Restrictions Precautions Precautions: Other (comment) Precaution Comments: Osteomyelitis of R great toe stump Restrictions Weight Bearing Restrictions: No      Mobility  Bed Mobility Overal bed mobility: Independent                Transfers Overall transfer level: Independent               General transfer comment: pt up in room indep  Ambulation/Gait Ambulation/Gait assistance: Independent Ambulation Distance (Feet): 550 Feet Assistive device: None Gait Pattern/deviations: Antalgic Gait velocity: wfl Gait velocity interpretation: at or above normal speed for age/gender General Gait Details: antalgic due to bilat foot pain, no episodes of LOB  Stairs            Wheelchair Mobility    Modified Rankin (Stroke Patients Only)       Balance Overall balance assessment: No apparent balance deficits (not formally assessed)                                           Pertinent Vitals/Pain Pain Assessment: 0-10 Pain Score: 10-Worst pain ever Pain Location: bilat feet Pain Descriptors /  Indicators: Sharp Pain Intervention(s): Monitored during session    Home Living Family/patient expects to be discharged to:: Private residence Living Arrangements: Alone Available Help at Discharge: Family;Available PRN/intermittently Type of Home: House Home Access: Level entry     Home Layout: One level Home Equipment: Shower seat - built in;Grab bars - toilet;Hand held shower head Additional Comments: pt lives in the basement of his mother and step fathers house    Prior Function Level of Independence: Independent         Comments: works, 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     Hand Dominance   Dominant Hand: Right    Extremity/Trunk Assessment   Upper Extremity Assessment Upper Extremity Assessment: Overall WFL for tasks assessed    Lower Extremity Assessment Lower Extremity Assessment: Overall WFL for tasks assessed    Cervical / Trunk Assessment Cervical / Trunk Assessment: Normal  Communication   Communication: No difficulties  Cognition Arousal/Alertness: Awake/alert Behavior During Therapy: WFL for tasks assessed/performed Overall Cognitive Status: Within Functional Limits for tasks assessed                                        General Comments General comments (skin integrity, edema, etc.): pt with necrotic hole at stump of R  great toe    Exercises     Assessment/Plan    PT Assessment Patient needs continued PT services  PT Problem List Decreased activity tolerance;Decreased mobility;Pain       PT Treatment Interventions DME instruction;Gait training;Stair training;Functional mobility training;Therapeutic activities;Therapeutic exercise;Balance training    PT Goals (Current goals can be found in the Care Plan section)  Acute Rehab PT Goals Patient Stated Goal: move surgery up earlier PT Goal Formulation: With patient Time For Goal Achievement: 03/29/17 Potential to Achieve Goals: Good Additional Goals Additional Goal #1: Pt  to score >19 on DGI to indicate minimal falls risk.    Frequency Min 2X/week   Barriers to discharge        Co-evaluation               End of Session   Activity Tolerance: Patient tolerated treatment well Patient left: in bed;with call bell/phone within reach Nurse Communication: Mobility status PT Visit Diagnosis: Pain;Difficulty in walking, not elsewhere classified (R26.2) Pain - Right/Left: Right Pain - part of body: Ankle and joints of foot    Time: 8295-6213 PT Time Calculation (min) (ACUTE ONLY): 17 min   Charges:   PT Evaluation $PT Eval Low Complexity: 1 Procedure     PT G CodesLewis [name], PT, DPT Pager #: [phone] Office #: [phone]  Antonio [name] Antonio [name] 03/25/2017, 10:23 AM

## 2017-03-25 NOTE — Progress Notes (Signed)
Pharmacy Antibiotic Note  Antonio [name]. is a 61 y.o. male admitted on 03/24/2017 with Right foot osteomyelitis.  Pharmacy has been consulted for Vancomycin dosing.  Plan: Vancomycin 2 g IV now, then 1 g IV q8h  Height: 6' (182.9 cm) Weight: 229 lb 15 oz (104.3 kg) IBW/kg (Calculated) : 77.6  Temp (24hrs), Avg:98.2 F (36.8 C), Min:97.7 F (36.5 C), Max:98.4 F (36.9 C)   Recent Labs Lab 03/24/17 2026 03/24/17 2039  WBC 6.3  --   CREATININE 0.74  --   LATICACIDVEN  --  1.27    Estimated Creatinine Clearance: 124.2 mL/min (by C-G formula based on SCr of 0.74 mg/dL).    Allergies  Allergen Reactions  . Iodinated Diagnostic Agents Hives, Rash and Other (See Comments)    Blisters Staph  . Penicillins Other (See Comments)    Childhood allergy     Eddie Candle 03/25/2017 3:52 AM

## 2017-03-26 LAB — CBC
HCT: 45.3 % (ref 39.0–52.0)
Hemoglobin: 15.2 g/dL (ref 13.0–17.0)
MCH: 29.6 pg (ref 26.0–34.0)
MCHC: 33.6 g/dL (ref 30.0–36.0)
MCV: 88.3 fL (ref 78.0–100.0)
PLATELETS: 180 10*3/uL (ref 150–400)
RBC: 5.13 MIL/uL (ref 4.22–5.81)
RDW: 13 % (ref 11.5–15.5)
WBC: 4.9 10*3/uL (ref 4.0–10.5)

## 2017-03-26 LAB — BASIC METABOLIC PANEL
Anion gap: 9 (ref 5–15)
BUN: 15 mg/dL (ref 6–20)
CO2: 24 mmol/L (ref 22–32)
Calcium: 8.6 mg/dL — ABNORMAL LOW (ref 8.9–10.3)
Chloride: 101 mmol/L (ref 101–111)
Creatinine, Ser: 0.6 mg/dL — ABNORMAL LOW (ref 0.61–1.24)
GFR calc Af Amer: >60 mL/min (ref >60)
GLUCOSE: 229 mg/dL — AB (ref 65–99)
Potassium: 4 mmol/L (ref 3.5–5.1)
Sodium: 134 mmol/L — ABNORMAL LOW (ref 135–145)

## 2017-03-26 LAB — GLUCOSE, CAPILLARY
GLUCOSE-CAPILLARY: 257 mg/dL — AB (ref 65–99)
Glucose-Capillary: 217 mg/dL — ABNORMAL HIGH (ref 65–99)
Glucose-Capillary: 252 mg/dL — ABNORMAL HIGH (ref 65–99)
Glucose-Capillary: 199 mg/dL — ABNORMAL HIGH (ref 65–99)
Glucose-Capillary: 250 mg/dL — ABNORMAL HIGH (ref 65–99)

## 2017-03-26 LAB — HEMOGLOBIN A1C
Hgb A1c MFr Bld: 12.3 % — ABNORMAL HIGH (ref 4.8–5.6)
Mean Plasma Glucose: 306 mg/dL

## 2017-03-26 MED ORDER — INSULIN GLARGINE 100 UNIT/ML ~~LOC~~ SOLN
7.0000 [IU] | Freq: Every day | SUBCUTANEOUS | Status: DC
  Filled 2017-03-26: qty 0.07

## 2017-03-26 MED ORDER — CLINDAMYCIN PHOSPHATE 900 MG/50ML IV SOLN
900.0000 mg | INTRAVENOUS | Status: AC
  Administered 2017-03-27: 900 mg via INTRAVENOUS
  Filled 2017-03-26 (×2): qty 50

## 2017-03-26 MED ORDER — INSULIN GLARGINE 100 UNIT/ML ~~LOC~~ SOLN
7.0000 [IU] | Freq: Every day | SUBCUTANEOUS | Status: AC
  Administered 2017-03-26: 7 [IU] via SUBCUTANEOUS

## 2017-03-26 NOTE — Progress Notes (Signed)
Inpatient Diabetes Program Recommendations  AACE/ADA: New Consensus Statement on Inpatient Glycemic Control (2015)  Target Ranges:  Prepandial:   less than 140 mg/dL      Peak postprandial:   less than 180 mg/dL (1-2 hours)      Critically ill patients:  140 - 180 mg/dL   Lab Results  Component Value Date   GLUCAP 252 (H) 03/26/2017   HGBA1C 12.3 (H) 03/25/2017    Review of Glycemic Control  Spoke with pt regarding his glycemic control at home. Pt states he does not want to go back to his PCP in [place]. He takes metformin 1000 mg QD (given to him by ex-wife). Also on Lantus 20 units QHS. Not taking glipizide 5 mg bid or tradjenta 5 mg QD d/t cost. HgbA1C 12.3%. Doubtful pt is taking his insulin. Appears uninterested in his diabetes. Stressed importance of controlling his blood sugars for wound healing and to reduce risks of complications from high blood sugars. Needs to monitor blood sugars several times/day and take logbook to PCP for review.  For surgery on 5/2. Needs tighter glucose control.  Inpatient Diabetes Program Recommendations:    Increase Lantus to 20 units QHS Add Novolog 4 units tidwc for meal coverage insulin  Increase Novolog to 0-20 units tidwc and hs. If NPO, Novolog 0-20 units Q4H.  Discussed above with RN. Will continue to follow.  Thank you. Ailene Ards, RD, LDN, CDE Inpatient Diabetes Coordinator [phone]

## 2017-03-26 NOTE — Progress Notes (Signed)
Family Medicine Teaching Service Daily Progress Note Intern Pager: [phone]  Patient name: Antonio [name]. Medical record number: [id number] Date of birth: 1956/01/17 Age: 61 y.o. Gender: male  Primary Care Provider: Dema Severin, NP Consultants: Orthopedics Code Status: Full  Pt Overview and Major Events to Date:  4/29 - admit to FPTS   Assessment and Plan: Antonio [name]. is a 61 y.o. male presenting with Right great toe stump infection. PMH is significant for T2DM (Last A1c 11.7 in 12/2015), HTN, HLD, chronic hyponatremia, history DVT's per patient, CAD/PAD, tobacco use  Osteomyelitis R first metatarsal: Continues to be afebrile with stable vitals, no leukocytosis. Most likely poor wound healing from uncontrolled diabetes. Osteo confirmed on MR R foot. - WOC consult - wound culture - abundant gram positive cocci in pair, mod gram variable rod, culture too young to read - continue Vanc/Levo 4/30 -  (allergic to penicillins and want pseudomonas coverage) - orthopedics recs- plan for surgery wednesday - monitor vitals per floor protocol  - heparin will be held day of surgery  Diabetes, poorly controlled: HbA1c 12.3 this admission. Uncertain outpatient regimen. Home medications per chart include 20u lantus nightly Tradjenta 5 mg, glipizide 5 mg BID, however patient endorses only taking metformin BID which is not seen in epic. CBG 257, 244 overnight, required 20u short-acting insulin - holding home regimen - titrate from Lantus at 5u to lantus 7u, continue to titrate as needed - moderate Insulin Sliding Scale - CBG monitoring AC +qhs - CBG q4H while NPO after MN for surgery  Hyponatremia, chronic: Sodium improved at 134 this AM from 127 at admission. Previously worked up at admission one year ago with serum osm, urine osm, and urine sodium WNL at that time. - IVNS at 125 cc/hr - AM BMP  Hypertension, currently normotensive: Patient states he does not know his home  medication, but endorses taking a pill daily. Per chart review, patient previously on Coreg 6.25mg  BID, lisinopril 10 mg BID, and metoprolol 25 mg also seen on this list.  Uncertain of most up-to-date medication list. - Hold home med - monitor BP  CAD/PAD/HLD - patient notes history of stent placement in left leg, balloon procedure in right leg. Endorses claudication. No lipid panel per chart.  Previously was on atorvastatin 40 mg daily, unclear whether he is still taking this. - continue statin  Tobacco Abuse: Smokes 2ppd. Refuses nicotine patch at admission. - Smoking cessation counseling during hospitalization  DVTs: History reported by patient on year ago, not endorsed this admission. Takes ASA 81 mg daily. Previous notes indicate he was on plavix 75 mg.  - Holding ASA, Plavix due to possible surgery - Heparin SQ  FEN/GI: IVNS at 125 cc/hr, NPO at midnight for procedure 5/2 Prophylaxis: heparin  Disposition: Plan for surgery by ortho wednesday  Subjective:  Patient did well overnight with no acute events. No leukocytosis or fevers. Pain is moderately controlled, reminded patient his medications are PRN and so he should be asking for them if he is having pain. Agreeable to surgery tomorrow.  Objective: Temp:  [98 F (36.7 C)-98.2 F (36.8 C)] 98.2 F (36.8 C) (05/01 0455) Pulse Rate:  [51-78] 78 (05/01 0455) Resp:  [18] 18 (05/01 0455) BP: (129-156)/(72-87) 156/72 (05/01 0455) SpO2:  [92 %-95 %] 95 % (05/01 0455) Physical Exam: General: NAD, rests comfortably, in good spirits Cardiovascular: RRR, no m/r/g Respiratory: CTA bil, no W/R/R Abdomen: soft and nontender, nondistended Extremities: L foot s/p 5 toes amputated with  small blister over great toe stump and eschar. Right foot s/p great toe amputation with ulcer containing +granulation tissue and discharge noted over amputation site. +foul odor. LE are warm and well-perfused. UE normal. No surrounding erythema or warmth  around ulcers. Tender to palpation.   Laboratory:  Recent Labs Lab 03/24/17 2026 03/25/17 0328 03/26/17 0440  WBC 6.3 5.2 4.9  HGB 16.6 14.7 15.2  HCT 47.3 43.4 45.3  PLT 212 175 180    Recent Labs Lab 03/24/17 2026 03/24/17 2130 03/25/17 0328 03/26/17 0440  NA 127*  --  133* 134*  K 5.6* 4.9 4.3 4.0  CL 93*  --  97* 101  CO2 23  --  27 24  BUN 13  --  12 15  CREATININE 0.74  --  0.66 0.60*  CALCIUM 8.9  --  9.0 8.6*  PROT 5.9*  --   --   --   BILITOT 2.5*  --   --   --   ALKPHOS 84  --   --   --   ALT 24  --   --   --   AST 36  --   --   --   GLUCOSE 409*  --  347* 229*   Imaging/Diagnostic Tests: Dg Foot Complete Right 03/24/2017 CLINICAL DATA:  Probable infection at the amputation site. EXAM: RIGHT FOOT COMPLETE - 3+ VIEW COMPARISON:  12/03/2014 FINDINGS: Prior first digit amputation at the first MTP joint. There is a soft tissue ulcer at the amputation stump. There is loss of bony cortex at the medial aspect of the first metatarsal head, with mild underlying sclerosis. This is suspicious for osteomyelitis.  IMPRESSION: Suspicious for osteomyelitis involving the first metatarsal head. There also is an overlying soft tissue ulcer of the amputation stump.    Howard Pouch, MD 03/26/2017, 7:14 AM PGY-1, [facility] Family Medicine FPTS Intern pager: [phone], text pages welcome

## 2017-03-27 ENCOUNTER — Encounter (HOSPITAL_COMMUNITY)
Admission: EM | Disposition: A | Discharge status: Home / Self Care | Payer: Self-pay | Source: Home / Self Care | Attending: Family Medicine

## 2017-03-27 ENCOUNTER — Inpatient Hospital Stay (HOSPITAL_COMMUNITY): Payer: Medicaid Other | Admitting: Certified Registered"

## 2017-03-27 ENCOUNTER — Encounter (HOSPITAL_COMMUNITY): Payer: Self-pay | Admitting: Certified Registered"

## 2017-03-27 DIAGNOSIS — Z794 Long term (current) use of insulin: Secondary | ICD-10-CM

## 2017-03-27 DIAGNOSIS — E11628 Type 2 diabetes mellitus with other skin complications: Secondary | ICD-10-CM

## 2017-03-27 DIAGNOSIS — E1165 Type 2 diabetes mellitus with hyperglycemia: Secondary | ICD-10-CM

## 2017-03-27 HISTORY — PX: AMPUTATION: SHX166

## 2017-03-27 LAB — CBC
HCT: 43.9 % (ref 39.0–52.0)
HEMOGLOBIN: 15 g/dL (ref 13.0–17.0)
MCH: 29.9 pg (ref 26.0–34.0)
MCHC: 34.2 g/dL (ref 30.0–36.0)
MCV: 87.5 fL (ref 78.0–100.0)
Platelets: 146 10*3/uL — ABNORMAL LOW (ref 150–400)
RBC: 5.02 MIL/uL (ref 4.22–5.81)
RDW: 12.8 % (ref 11.5–15.5)
WBC: 4.9 10*3/uL (ref 4.0–10.5)

## 2017-03-27 LAB — GLUCOSE, CAPILLARY
GLUCOSE-CAPILLARY: 210 mg/dL — AB (ref 65–99)
GLUCOSE-CAPILLARY: 207 mg/dL — AB (ref 65–99)
GLUCOSE-CAPILLARY: 208 mg/dL — AB (ref 65–99)
Glucose-Capillary: 197 mg/dL — ABNORMAL HIGH (ref 65–99)
Glucose-Capillary: 273 mg/dL — ABNORMAL HIGH (ref 65–99)
Glucose-Capillary: 240 mg/dL — ABNORMAL HIGH (ref 65–99)

## 2017-03-27 LAB — BASIC METABOLIC PANEL
Anion gap: 7 (ref 5–15)
BUN: 10 mg/dL (ref 6–20)
CHLORIDE: 103 mmol/L (ref 101–111)
CO2: 24 mmol/L (ref 22–32)
Calcium: 8.8 mg/dL — ABNORMAL LOW (ref 8.9–10.3)
Creatinine, Ser: 0.57 mg/dL — ABNORMAL LOW (ref 0.61–1.24)
GFR calc Af Amer: >60 mL/min (ref >60)
GFR calc non Af Amer: >60 mL/min (ref >60)
Glucose, Bld: 208 mg/dL — ABNORMAL HIGH (ref 65–99)
Potassium: 4 mmol/L (ref 3.5–5.1)
SODIUM: 134 mmol/L — AB (ref 135–145)

## 2017-03-27 SURGERY — AMPUTATION, FOOT, RAY
Anesthesia: General | Laterality: Right

## 2017-03-27 MED ORDER — FENTANYL CITRATE (PF) 250 MCG/5ML IJ SOLN
INTRAMUSCULAR | Status: AC
  Filled 2017-03-27: qty 5

## 2017-03-27 MED ORDER — HYDROMORPHONE HCL 1 MG/ML IJ SOLN
1.0000 mg | INTRAMUSCULAR | Status: DC | PRN
  Administered 2017-03-27 – 2017-03-28 (×2): 1 mg via INTRAVENOUS
  Filled 2017-03-27 (×2): qty 1

## 2017-03-27 MED ORDER — MIDAZOLAM HCL 5 MG/5ML IJ SOLN
INTRAMUSCULAR | Status: DC | PRN
  Administered 2017-03-27: 2 mg via INTRAVENOUS

## 2017-03-27 MED ORDER — ONDANSETRON HCL 4 MG PO TABS: 4.0000 mg | ORAL_TABLET | Freq: Four times a day (QID) | ORAL | Status: DC | PRN

## 2017-03-27 MED ORDER — ONDANSETRON HCL 4 MG/2ML IJ SOLN: 4.0000 mg | Freq: Four times a day (QID) | INTRAMUSCULAR | Status: DC | PRN

## 2017-03-27 MED ORDER — 0.9 % SODIUM CHLORIDE (POUR BTL) OPTIME
TOPICAL | Status: DC | PRN
  Administered 2017-03-27: 1000 mL

## 2017-03-27 MED ORDER — ONDANSETRON HCL 4 MG/2ML IJ SOLN
INTRAMUSCULAR | Status: AC
  Filled 2017-03-27: qty 2

## 2017-03-27 MED ORDER — ONDANSETRON HCL 4 MG/2ML IJ SOLN
INTRAMUSCULAR | Status: DC | PRN
  Administered 2017-03-27: 4 mg via INTRAVENOUS

## 2017-03-27 MED ORDER — POLYETHYLENE GLYCOL 3350 17 G PO PACK: 17.0000 g | PACK | Freq: Every day | ORAL | Status: DC | PRN

## 2017-03-27 MED ORDER — LACTATED RINGERS IV SOLN
INTRAVENOUS | Status: DC | PRN
  Administered 2017-03-27 (×2): via INTRAVENOUS

## 2017-03-27 MED ORDER — ACETAMINOPHEN 325 MG PO TABS: 650.0000 mg | ORAL_TABLET | Freq: Four times a day (QID) | ORAL | Status: DC | PRN

## 2017-03-27 MED ORDER — LIDOCAINE HCL (CARDIAC) 20 MG/ML IV SOLN
INTRAVENOUS | Status: DC | PRN
  Administered 2017-03-27: 100 mg via INTRAVENOUS

## 2017-03-27 MED ORDER — BISACODYL 10 MG RE SUPP: 10.0000 mg | Freq: Every day | RECTAL | Status: DC | PRN

## 2017-03-27 MED ORDER — METHOCARBAMOL 1000 MG/10ML IJ SOLN
500.0000 mg | Freq: Four times a day (QID) | INTRAVENOUS | Status: DC | PRN
  Filled 2017-03-27: qty 5

## 2017-03-27 MED ORDER — MIDAZOLAM HCL 2 MG/2ML IJ SOLN
INTRAMUSCULAR | Status: AC
  Filled 2017-03-27: qty 2

## 2017-03-27 MED ORDER — PROPOFOL 10 MG/ML IV BOLUS
INTRAVENOUS | Status: DC | PRN
  Administered 2017-03-27: 200 mg via INTRAVENOUS

## 2017-03-27 MED ORDER — MAGNESIUM CITRATE PO SOLN: 1.0000 | Freq: Once | ORAL | Status: DC | PRN

## 2017-03-27 MED ORDER — OXYCODONE HCL 5 MG PO TABS
5.0000 mg | ORAL_TABLET | ORAL | Status: DC | PRN
  Administered 2017-03-27 – 2017-03-28 (×8): 10 mg via ORAL
  Filled 2017-03-27 (×8): qty 2

## 2017-03-27 MED ORDER — DOCUSATE SODIUM 100 MG PO CAPS
100.0000 mg | ORAL_CAPSULE | Freq: Two times a day (BID) | ORAL | Status: DC
  Administered 2017-03-28: 100 mg via ORAL
  Filled 2017-03-27 (×2): qty 1

## 2017-03-27 MED ORDER — METOCLOPRAMIDE HCL 5 MG/ML IJ SOLN: 5.0000 mg | Freq: Three times a day (TID) | INTRAMUSCULAR | Status: DC | PRN

## 2017-03-27 MED ORDER — METHOCARBAMOL 500 MG PO TABS
500.0000 mg | ORAL_TABLET | Freq: Four times a day (QID) | ORAL | Status: DC | PRN
  Administered 2017-03-27 – 2017-03-28 (×2): 500 mg via ORAL
  Filled 2017-03-27 (×2): qty 1

## 2017-03-27 MED ORDER — METOCLOPRAMIDE HCL 5 MG PO TABS: 5.0000 mg | ORAL_TABLET | Freq: Three times a day (TID) | ORAL | Status: DC | PRN

## 2017-03-27 MED ORDER — FENTANYL CITRATE (PF) 100 MCG/2ML IJ SOLN
INTRAMUSCULAR | Status: DC | PRN
  Administered 2017-03-27 (×2): 50 ug via INTRAVENOUS

## 2017-03-27 MED ORDER — ACETAMINOPHEN 650 MG RE SUPP: 650.0000 mg | Freq: Four times a day (QID) | RECTAL | Status: DC | PRN

## 2017-03-27 MED ORDER — METFORMIN HCL 500 MG PO TABS
1000.0000 mg | ORAL_TABLET | Freq: Two times a day (BID) | ORAL | Status: DC
  Administered 2017-03-27 – 2017-03-28 (×2): 1000 mg via ORAL
  Filled 2017-03-27 (×2): qty 2

## 2017-03-27 MED ORDER — LIDOCAINE 2% (20 MG/ML) 5 ML SYRINGE
INTRAMUSCULAR | Status: AC
  Filled 2017-03-27: qty 5

## 2017-03-27 MED ORDER — PROPOFOL 10 MG/ML IV BOLUS
INTRAVENOUS | Status: AC
  Filled 2017-03-27: qty 20

## 2017-03-27 MED ORDER — SODIUM CHLORIDE 0.9 % IV SOLN: INTRAVENOUS | Status: DC

## 2017-03-27 SURGICAL SUPPLY — 26 items
BLADE SAW SGTL MED 73X18.5 STR (BLADE) IMPLANT
BLADE SURG 21 STRL SS (BLADE) ×3 IMPLANT
BNDG COHESIVE 4X5 TAN STRL (GAUZE/BANDAGES/DRESSINGS) ×3 IMPLANT
BNDG GAUZE ELAST 4 BULKY (GAUZE/BANDAGES/DRESSINGS) ×3 IMPLANT
COVER SURGICAL LIGHT HANDLE (MISCELLANEOUS) ×3 IMPLANT
DRAPE U-SHAPE 47X51 STRL (DRAPES) ×6 IMPLANT
DRSG ADAPTIC 3X8 NADH LF (GAUZE/BANDAGES/DRESSINGS) ×3 IMPLANT
DRSG PAD ABDOMINAL 8X10 ST (GAUZE/BANDAGES/DRESSINGS) ×3 IMPLANT
DURAPREP 26ML APPLICATOR (WOUND CARE) ×3 IMPLANT
ELECT REM PT RETURN 9FT ADLT (ELECTROSURGICAL) ×3
ELECTRODE REM PT RTRN 9FT ADLT (ELECTROSURGICAL) ×1 IMPLANT
GAUZE SPONGE 4X4 12PLY STRL (GAUZE/BANDAGES/DRESSINGS) ×3 IMPLANT
GAUZE SPONGE 4X4 16PLY XRAY LF (GAUZE/BANDAGES/DRESSINGS) ×3 IMPLANT
GLOVE BIOGEL PI IND STRL 9 (GLOVE) ×1 IMPLANT
GLOVE BIOGEL PI INDICATOR 9 (GLOVE) ×2
GLOVE SURG ORTHO 9.0 STRL STRW (GLOVE) ×3 IMPLANT
GOWN STRL REUS W/ TWL XL LVL3 (GOWN DISPOSABLE) ×2 IMPLANT
GOWN STRL REUS W/TWL XL LVL3 (GOWN DISPOSABLE) ×4
KIT BASIN OR (CUSTOM PROCEDURE TRAY) ×3 IMPLANT
KIT ROOM TURNOVER OR (KITS) ×3 IMPLANT
NS IRRIG 1000ML POUR BTL (IV SOLUTION) ×3 IMPLANT
PACK ORTHO EXTREMITY (CUSTOM PROCEDURE TRAY) IMPLANT
PAD ARMBOARD 7.5X6 YLW CONV (MISCELLANEOUS) ×3 IMPLANT
STOCKINETTE IMPERVIOUS LG (DRAPES) ×3 IMPLANT
SUT ETHILON 2 0 PSLX (SUTURE) ×3 IMPLANT
TOWEL OR 17X26 10 PK STRL BLUE (TOWEL DISPOSABLE) ×3 IMPLANT

## 2017-03-27 NOTE — Transfer of Care (Signed)
Immediate Anesthesia Transfer of Care Note  Patient: Antonio [name].  Procedure(s) Performed: Procedure(s): 1st Ray Amputation Right Foot (Right)  Patient Location: PACU  Anesthesia Type:General  Level of Consciousness: sedated  Airway & Oxygen Therapy: Patient Spontanous Breathing and Patient connected to nasal cannula oxygen  Post-op Assessment: Report given to RN and Post -op Vital signs reviewed and stable  Post vital signs: Reviewed and stable  Last Vitals:  Vitals:   03/27/17 0500 03/27/17 1200  BP: 138/78 99/69  Pulse: 72 62  Resp: 18 17  Temp: 36.8 C     Last Pain:  Vitals:   03/27/17 0500  TempSrc: Oral  PainSc:          Complications: No apparent anesthesia complications

## 2017-03-27 NOTE — H&P (View-Only) (Signed)
  ORTHOPAEDIC CONSULTATION  REQUESTING PHYSICIAN: Marshall L Chambliss, MD  Chief Complaint: Draining ulcer right first metatarsal head.  HPI: Antonio Watson Furio Jr. is a 61 y.o. male who presents with a three-week history of a draining ulcer right first metatarsal head. Patient states he struck his foot on the bed about 3 weeks ago developing an ulcer which has not resolved. Patient complains of a foul-smelling draining ulcer. Patient also has a callus over the first metatarsal left transmetatarsal amputation.  Past Medical History:  Diagnosis Date  . Diabetes mellitus without complication (HCC)   . Hyperlipidemia   . Hypertension    Past Surgical History:  Procedure Laterality Date  . CARDIAC CATHETERIZATION    . Great toe amputation right.    . I&D EXTREMITY Left 12/30/2015   Procedure: IRRIGATION AND DEBRIDEMENT LEFT FOOT, TRANSMETATARSAL AMPUTATION WITH APPLICATION OF ANTIBIOTIC BEADS AND WOUND VAC;  Surgeon: Leemon Ayala V, MD;  Location: [facility] OR;  Service: Orthopedics;  Laterality: Left;  . TONSILLECTOMY     Social History   Social History  . Marital status: Divorced    Spouse name: N/A  . Number of children: N/A  . Years of education: N/A   Social History Main Topics  . Smoking status: Current Every Day Smoker    Packs/day: 2.00    Types: Cigarettes  . Smokeless tobacco: Never Used  . Alcohol use Yes     Comment: beers occasionally  . Drug use: No  . Sexual activity: Not Asked   Other Topics Concern  . None   Social History Narrative  . None   Family History  Problem Relation Age of Onset  . Diabetes Mother   . Emphysema Father    - negative except otherwise stated in the family history section Allergies  Allergen Reactions  . Iodinated Diagnostic Agents Hives, Rash and Other (See Comments)    Blisters Staph  . Penicillins Other (See Comments)    Childhood allergy    Prior to Admission medications   Medication Sig Start Date End Date Taking?  Authorizing Provider  HYDROcodone-acetaminophen (NORCO/VICODIN) 5-325 MG tablet Take 1 tablet by mouth every 6 (six) hours as needed for moderate pain. 12/31/15  Yes Asiyah Zahra Mikell, MD  insulin glargine (LANTUS) 100 UNIT/ML injection Inject 20 Units into the skin at bedtime.    Yes Historical Provider, MD  oxyCODONE-acetaminophen (ROXICET) 5-325 MG tablet Take 1-2 tablets by mouth every 4 (four) hours as needed. Patient taking differently: Take 1-2 tablets by mouth every 4 (four) hours as needed for moderate pain.  12/31/15  Yes Christopher Y Blackman, MD   Dg Foot Complete Right  Result Date: 03/24/2017 CLINICAL DATA:  Probable infection at the amputation site. EXAM: RIGHT FOOT COMPLETE - 3+ VIEW COMPARISON:  12/03/2014 FINDINGS: Prior first digit amputation at the first MTP joint. There is a soft tissue ulcer at the amputation stump. There is loss of bony cortex at the medial aspect of the first metatarsal head, with mild underlying sclerosis. This is suspicious for osteomyelitis. IMPRESSION: Suspicious for osteomyelitis involving the first metatarsal head. There also is an overlying soft tissue ulcer of the amputation stump. Electronically Signed   By: Audon R Mitchell M.D.   On: 03/24/2017 21:56   - pertinent xrays, CT, MRI studies were reviewed and independently interpreted  Positive ROS: All other systems have been reviewed and were otherwise negative with the exception of those mentioned in the HPI and as above.  Physical Exam: General: Alert, no   acute distress Psychiatric: Patient is competent for consent with normal mood and affect Lymphatic: No axillary or cervical lymphadenopathy Cardiovascular: No pedal edema Respiratory: No cyanosis, no use of accessory musculature GI: No organomegaly, abdomen is soft and non-tender  Skin: Examination patient has some mild cellulitis around the first metatarsal head right foot. He has an open draining ulcer that probes to bone.   Neurologic:  Patient does not have protective sensation bilateral lower extremities.   MUSCULOSKELETAL:  Examination patient still has elevated blood glucose levels. He does not have a palpable dorsalis pedis pulse. He has a foul smelling draining ulcer that probes to bone first metatarsal head right foot. Radiographs show destructive changes of the first metatarsal head consistent with chronic osteomyelitis.  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  Plan: Plan: We'll plan for right foot first ray amputation on Wednesday. Risks and benefits were discussed including risk of the wound not healing. Patient states he understands wishes to proceed at this time.  Thank you for the consult and the opportunity to see Mr. Antonio Lefever, MD [facility] [phone] 6:39 AM

## 2017-03-27 NOTE — Progress Notes (Signed)
Talked with Dr. Lajoyce Corners about antibiotic and DVT prophylaxis. Per Dr. Lajoyce Corners, patient doesn't need antibiotic. Can be discharged on aspirin for DVT prophylaxis. ??History of DVT but his lower extremity Doppler was negative on 12/29/2015.

## 2017-03-27 NOTE — Progress Notes (Signed)
Family Medicine Teaching Service Daily Progress Note Intern Pager: [phone]  Patient name: Antonio [name]. Medical record number: [id number] Date of birth: 13-Dec-1955 Age: 61 y.o. Gender: male  Primary Care Provider: Dema Severin, NP Consultants: Orthopedics Code Status: Full  Pt Overview and Major Events to Date:  4/29 - admit to FPTS   Assessment and Plan: Trystian Crisanto. is a 61 y.o. male presenting with Right great toe stump infection. PMH is significant for T2DM (Last A1c 11.7 in 12/2015), HTN, HLD, chronic hyponatremia, history DVT's per patient, CAD/PAD, tobacco use  Osteomyelitis R first metatarsal: Afebrile, vitals stable. No leukocytosis. Osteo confirmed on MR R foot. Plan for amputation right foot today (5/2). - WOC consult - wound culture - abundant gram positive cocci in pair, mod gram variable rod, culture nl skin flora, mod prevotella, beta lactamase positive - Vanc/Levo (4/30) -  f/u ortho recs after surgery, likely DC antibiotics - f/u orthopedics recs after surgery regarding restarting heparin - monitor vitals per floor protocol   Diabetes, poorly controlled: HbA1c 12.3 this admission. Uncertain outpatient regimen. Patient endorses only taking metformin BID which is not seen in epic. CBG 250, 210 overnight, required 13u short-acting insulin - holding home regimen - restart metformin 1000 mg BID after surgery - moderate Insulin Sliding Scale - CBG monitoring AC +qhs - CBG q4H while NPO  Hyponatremia, chronic: Sodium stable at 134 this AM from 127 at admission. Previously worked up at admission one year ago with serum osm, urine osm, and urine sodium WNL at that time. - IVNS at 125 cc/hr - AM BMP  Hypertension, currently normotensive: Patient states he does not know his home medication, but endorses taking a pill daily. Per chart review, patient previously on Coreg 6.25mg  BID, lisinopril 10 mg BID, and metoprolol 25 mg also seen on this list.   Uncertain of most up-to-date medication list. - Hold home med - monitor BP  CAD/PAD/HLD - patient notes history of stent placement in left leg, balloon procedure in right leg. Endorses claudication. No lipid panel per chart.  Previously was on atorvastatin 40 mg daily, unclear whether he is still taking this. - continue statin  Tobacco Abuse: Smokes 2ppd. Refuses nicotine patch at admission. - Smoking cessation counseling during hospitalization  DVTs: History reported by patient on year ago, not endorsed this admission. Takes ASA 81 mg daily. Previous notes indicate he was on plavix 75 mg.  - Holding ASA, Plavix due to possible surgery - Heparin SQ  FEN/GI: IVNS at 125 cc/hr, NPO at midnight for procedure 5/2 Prophylaxis: heparin  Disposition: For surgery by ortho  Subjective:  Patient did well overnight with no acute events. No complaints this AM.  Objective: Temp:  [97.8 F (36.6 C)-98.2 F (36.8 C)] 98.2 F (36.8 C) (05/02 0500) Pulse Rate:  [72-77] 72 (05/02 0500) Resp:  [18] 18 (05/02 0500) BP: (138-157)/(78-84) 138/78 (05/02 0500) SpO2:  [96 %-98 %] 98 % (05/02 0500) Physical Exam: General: NAD, sits comfortably, in good spirits Cardiovascular: RRR, no m/r/g Respiratory: CTA bil, no W/R/R Abdomen: soft and nontender, nondistended Extremities: L foot s/p 5 toes amputated with small blister over great toe stump and eschar. Right foot s/p great toe amputation wrapped with bandaging.  Laboratory:  Recent Labs Lab 03/25/17 0328 03/26/17 0440 03/27/17 0529  WBC 5.2 4.9 4.9  HGB 14.7 15.2 15.0  HCT 43.4 45.3 43.9  PLT 175 180 146*    Recent Labs Lab 03/24/17 2026  03/25/17 0328 03/26/17 0440  03/27/17 0529  NA 127*  --  133* 134* 134*  K 5.6*  < > 4.3 4.0 4.0  CL 93*  --  97* 101 103  CO2 23  --  BUN 13  --  CREATININE 0.74  --  0.66 0.60* 0.57*  CALCIUM 8.9  --  9.0 8.6* 8.8*  PROT 5.9*  --   --   --   --   BILITOT 2.5*  --   --    --   --   ALKPHOS 84  --   --   --   --   ALT 24  --   --   --   --   AST 36  --   --   --   --   GLUCOSE 409*  --  347* 229* 208*  < > = values in this interval not displayed. Imaging/Diagnostic Tests: Dg Foot Complete Right 03/24/2017 CLINICAL DATA:  Probable infection at the amputation site. EXAM: RIGHT FOOT COMPLETE - 3+ VIEW COMPARISON:  12/03/2014 FINDINGS: Prior first digit amputation at the first MTP joint. There is a soft tissue ulcer at the amputation stump. There is loss of bony cortex at the medial aspect of the first metatarsal head, with mild underlying sclerosis. This is suspicious for osteomyelitis.  IMPRESSION: Suspicious for osteomyelitis involving the first metatarsal head. There also is an overlying soft tissue ulcer of the amputation stump.    Howard Pouch, MD 03/27/2017, 9:31 AM PGY-1, [facility] Family Medicine FPTS Intern pager: [phone], text pages welcome

## 2017-03-27 NOTE — Op Note (Signed)
03/24/2017 - 03/27/2017  11:50 AM  PATIENT:  Antonio [name].    PRE-OPERATIVE DIAGNOSIS:  Osteomyelitis 1st Metatarsal Head Right Foot  POST-OPERATIVE DIAGNOSIS:  Same  PROCEDURE:  1st Ray Amputation Right Foot  SURGEON:  Nadara Mustard, MD  PHYSICIAN ASSISTANT:None ANESTHESIA:   General  PREOPERATIVE INDICATIONS:  Antonio [name]. is a  61 y.o. male with a diagnosis of Osteomyelitis 1st Metatarsal Head Right Foot who failed conservative measures and elected for surgical management.    The risks benefits and alternatives were discussed with the patient preoperatively including but not limited to the risks of infection, bleeding, nerve injury, cardiopulmonary complications, the need for revision surgery, among others, and the patient was willing to proceed.  OPERATIVE IMPLANTS: None  OPERATIVE FINDINGS: No deep abscess good petechial bleeding  OPERATIVE PROCEDURE: Patient was brought the operating room and underwent a general anesthetic. After adequate levels anesthesia were obtained patient's right lower extremity was prepped using DuraPrep draped into a sterile field a timeout was called. A racquet incision was made around the first metatarsal head extending proximally along the base. The first metatarsal was resected through the base electrocautery was used for hemostasis the wound was irrigated with normal saline. Local tissue was rearranged to close the wound which was 4 x 7 cm. Incision was closed using 2-0 nylon. A compressive dressing was applied. Patient was extubated taken to the PACU in stable condition.   Anticipate patient can be discharged to home tomorrow Thursday.

## 2017-03-27 NOTE — Interval H&P Note (Signed)
History and Physical Interval Note:  03/27/2017 6:44 AM  Antonio [name].  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 After consideration of risks, benefits and other options for treatment, the patient has consented to  Procedure(s): 1st Ray Amputation Right Foot (Right) as a surgical intervention .  The patient's history has been reviewed, patient examined, no change in status, stable for surgery.  I have reviewed the patient's chart and labs.  Questions were answered to the patient's satisfaction.     Nadara Mustard

## 2017-03-27 NOTE — Anesthesia Postprocedure Evaluation (Addendum)
Anesthesia Post Note  Patient: Mikel Hardgrove.  Procedure(s) Performed: Procedure(s) (LRB): 1st Ray Amputation Right Foot (Right)  Patient location during evaluation: PACU Anesthesia Type: General Level of consciousness: awake and alert Pain management: pain level controlled Vital Signs Assessment: post-procedure vital signs reviewed and stable Respiratory status: spontaneous breathing, nonlabored ventilation, respiratory function stable and patient connected to nasal cannula oxygen Cardiovascular status: blood pressure returned to baseline and stable Postop Assessment: no signs of nausea or vomiting Anesthetic complications: no       Last Vitals:  Vitals:   03/27/17 1245 03/27/17 1324  BP:  (!) 149/78  Pulse: 73 66  Resp: 18 18  Temp: 36.7 C 36.4 C    Last Pain:  Vitals:   03/27/17 1324  TempSrc: Oral  PainSc:                  Dannica Bickham,JAMES TERRILL

## 2017-03-27 NOTE — Anesthesia Preprocedure Evaluation (Addendum)
Anesthesia Evaluation  Patient identified by MRN, date of birth, ID band Patient awake    Reviewed: Allergy & Precautions, NPO status , Patient's Chart, lab work & pertinent test results  Airway Mallampati: II  TM Distance: >3 FB Neck ROM: Full    Dental no notable dental hx.    Pulmonary Current Smoker,    Pulmonary exam normal breath sounds clear to auscultation       Cardiovascular hypertension, negative cardio ROS Normal cardiovascular exam Rhythm:Regular Rate:Normal     Neuro/Psych negative neurological ROS  negative psych ROS   GI/Hepatic negative GI ROS, Neg liver ROS,   Endo/Other  diabetes, Poorly Controlled, Type 1, Insulin Dependent  Renal/GU negative Renal ROS  negative genitourinary   Musculoskeletal negative musculoskeletal ROS (+)   Abdominal   Peds negative pediatric ROS (+)  Hematology negative hematology ROS (+)   Anesthesia Other Findings   Reproductive/Obstetrics negative OB ROS                            Anesthesia Physical Anesthesia Plan  ASA: III  Anesthesia Plan: General   Post-op Pain Management:    Induction: Intravenous  Airway Management Planned: LMA  Additional Equipment:   Intra-op Plan:   Post-operative Plan: Extubation in OR  Informed Consent: I have reviewed the patients History and Physical, chart, labs and discussed the procedure including the risks, benefits and alternatives for the proposed anesthesia with the patient or authorized representative who has indicated his/her understanding and acceptance.     Plan Discussed with: CRNA  Anesthesia Plan Comments:         Anesthesia Quick Evaluation

## 2017-03-27 NOTE — Anesthesia Procedure Notes (Signed)
Procedure Name: LMA Insertion Date/Time: 03/27/2017 11:30 AM Performed by: Jefm Miles E Pre-anesthesia Checklist: Patient identified, Emergency Drugs available, Suction available and Patient being monitored Patient Re-evaluated:Patient Re-evaluated prior to inductionOxygen Delivery Method: Circle System Utilized Preoxygenation: Pre-oxygenation with 100% oxygen Intubation Type: IV induction Ventilation: Mask ventilation without difficulty LMA: LMA inserted LMA Size: 5.0 Number of attempts: 1 Airway Equipment and Method: Bite block Placement Confirmation: positive ETCO2 Tube secured with: Tape Dental Injury: Teeth and Oropharynx as per pre-operative assessment

## 2017-03-28 ENCOUNTER — Encounter (HOSPITAL_COMMUNITY): Payer: Self-pay | Admitting: Orthopedic Surgery

## 2017-03-28 LAB — GLUCOSE, CAPILLARY
GLUCOSE-CAPILLARY: 200 mg/dL — AB (ref 65–99)
Glucose-Capillary: 225 mg/dL — ABNORMAL HIGH (ref 65–99)

## 2017-03-28 LAB — CBC
HEMATOCRIT: 44.4 % (ref 39.0–52.0)
HEMOGLOBIN: 14.4 g/dL (ref 13.0–17.0)
MCH: 29 pg (ref 26.0–34.0)
MCHC: 32.4 g/dL (ref 30.0–36.0)
MCV: 89.3 fL (ref 78.0–100.0)
Platelets: 172 10*3/uL (ref 150–400)
RBC: 4.97 MIL/uL (ref 4.22–5.81)
RDW: 13 % (ref 11.5–15.5)
WBC: 6.6 10*3/uL (ref 4.0–10.5)

## 2017-03-28 LAB — BASIC METABOLIC PANEL
ANION GAP: 8 (ref 5–15)
BUN: 12 mg/dL (ref 6–20)
CALCIUM: 8.6 mg/dL — AB (ref 8.9–10.3)
CHLORIDE: 99 mmol/L — AB (ref 101–111)
CO2: 28 mmol/L (ref 22–32)
Creatinine, Ser: 0.73 mg/dL (ref 0.61–1.24)
GFR calc non Af Amer: >60 mL/min (ref >60)
GLUCOSE: 211 mg/dL — AB (ref 65–99)
Potassium: 4.8 mmol/L (ref 3.5–5.1)
Sodium: 135 mmol/L (ref 135–145)

## 2017-03-28 LAB — AEROBIC/ANAEROBIC CULTURE (SURGICAL/DEEP WOUND): GRAM STAIN: NONE SEEN

## 2017-03-28 LAB — AEROBIC/ANAEROBIC CULTURE W GRAM STAIN (SURGICAL/DEEP WOUND): Culture: NORMAL

## 2017-03-28 MED ORDER — ATORVASTATIN CALCIUM 40 MG PO TABS: 40.0000 mg | ORAL_TABLET | Freq: Every day | ORAL | 0 refills | Status: DC

## 2017-03-28 MED ORDER — METFORMIN HCL 1000 MG PO TABS: 1000.0000 mg | ORAL_TABLET | Freq: Two times a day (BID) | ORAL | 0 refills | Status: DC

## 2017-03-28 MED ORDER — OXYCODONE HCL 5 MG PO TABS: 5.0000 mg | ORAL_TABLET | Freq: Three times a day (TID) | ORAL | 0 refills | Status: DC | PRN

## 2017-03-28 MED ORDER — INSULIN GLARGINE 100 UNIT/ML ~~LOC~~ SOLN: 5.0000 [IU] | Freq: Every day | SUBCUTANEOUS | 11 refills | Status: DC

## 2017-03-28 NOTE — Discharge Instructions (Signed)
You were hospitalized with an infection in your foot and you underwent amputation.    Please call and schedule a hospital follow up with your regular doctor to be seen Monday or Tuesday.  Your diabetes regimen has changed. Please take metformin 1000 mg twice daily. Please take Lantus 5 units each evening. Check your blood sugars three times daily. Hold insulin if sugars are less than 70. Goal sugars are between 70 and 135. Your lantus will likely need to be increased by your regular doctor after discharge.

## 2017-03-28 NOTE — Progress Notes (Addendum)
Physical Therapy Treatment/Re-eval Patient Details Name: Antonio RichardsDaniel [name] Marner Jr. MRN: [id number] DOB: Mar 23, 1956 Today's Date: 03/28/2017    History of Present Illness Antonio AmenDaniel [name] Antonio Cohenis a 61 y.o.malepresenting with Right great toe stump infection. First ray amputation on right LE on 03/27/17.  PMH is significant for T2DM (Last A1c 11.7 in 12/2015), HTN, HLD, chronic hyponatremia, history DVT's per patient, CAD/PAD, tobacco use. Pt planned for OR on Wednesday 5/2 with Dr. Lajoyce Cornersuda for 1st ray amputation.    PT Comments    Pt admitted with above diagnosis. Pt currently with functional limitations due to balance and endurance deficits. Pt was able to ambulate with RW with min guard assist with limited distance as TDWB right LE difficult for pt to incr distance with this as he was doing more NWB due to pain with weight bearing.    STates that family can assist him at home.    Would benefit from PT f/u but Medicaid does not cover so probably can't get that therapy.  Has  RW to use at home per pt but need to confirm this. Re-eval today after surgery with greatest difference being that pt cannot weight bear fully decreasing the distance pt can travel at home.  Pt will benefit from skilled PT to increase their independence and safety with mobility to allow discharge to the venue listed below.     Follow Up Recommendations  HHPT f/u safety eval recommended     Equipment Recommendations  Rolling walker with 5" wheels (Pt states he has RW but prior therapist did not doc )    Recommendations for Other Services       Precautions / Restrictions Precautions Precautions: Fall Restrictions Weight Bearing Restrictions: Yes RLE Weight Bearing: Touchdown weight bearing    Mobility  Bed Mobility Overal bed mobility: Independent                Transfers Overall transfer level: Needs assistance Equipment used: Rolling walker (2 wheeled) Transfers: Sit to/from Stand Sit to Stand: Supervision          General transfer comment: Pt did not need physical assist but cued for TDWB right LE.Cues for hand placement.   Ambulation/Gait Ambulation/Gait assistance: Min guard Ambulation Distance (Feet): 20 Feet Assistive device: Rolling walker (2 wheeled) Gait Pattern/deviations: Antalgic;Step-to pattern;Leaning posteriorly;Staggering right;Staggering left;Decreased stride length   Gait velocity interpretation: Below normal speed for age/gender General Gait Details: Pt able to take a steps with RW initially placing a little weight on heel but decided to hold right LE off floor.  limited distance as pt fatigued much quicker since he wasn't placing weight o n right foot.  Pt overall did well but would do best with min guard assist for safety as he was slightly unsteady the further he walked.  No physical assist needed but just unsteady gait toward end of ambulation.    Stairs            Wheelchair Mobility    Modified Rankin (Stroke Patients Only)       Balance Overall balance assessment: Needs assistance Sitting-balance support: No upper extremity supported;Feet supported Sitting balance-Leahy Scale: Good     Standing balance support: Bilateral upper extremity supported;During functional activity Standing balance-Leahy Scale: Poor Standing balance comment: relies on RW for balance as pt is TDWB right LE. STatic stance with RW with min guard assist and cannot withstand challenges to balanc.e  Cognition Arousal/Alertness: Awake/alert Behavior During Therapy: WFL for tasks assessed/performed Overall Cognitive Status: Within Functional Limits for tasks assessed                                        Exercises General Exercises - Lower Extremity Ankle Circles/Pumps: AROM;10 reps;Supine;Both Heel Slides: AROM;Both;10 reps;Supine    General Comments        Pertinent Vitals/Pain Pain Assessment: 0-10 Pain Score: 6   Pain Location: bilat feet Pain Descriptors / Indicators: Aching Pain Intervention(s): Limited activity within patient's tolerance;Monitored during session;Repositioned;Patient requesting pain meds-RN notified;Ice applied    Home Living                      Prior Function            PT Goals (current goals can now be found in the care plan section) Progress towards PT goals: Progressing toward goals    Frequency    Min 5X/week      PT Plan Frequency needs to be updated;Discharge plan needs to be updated    Co-evaluation              AM-PAC PT "6 Clicks" Daily Activity  Outcome Measure  Difficulty turning over in bed (including adjusting bedclothes, sheets and blankets)?: None Difficulty moving from lying on back to sitting on the side of the bed? : None Difficulty sitting down on and standing up from a chair with arms (e.g., wheelchair, bedside commode, etc,.)?: None Help needed moving to and from a bed to chair (including a wheelchair)?: A Little Help needed walking in hospital room?: A Little Help needed climbing 3-5 steps with a railing? : A Lot 6 Click Score: 20    End of Session Equipment Utilized During Treatment: Gait belt Activity Tolerance: Patient tolerated treatment well Patient left: in bed;with call bell/phone within reach Nurse Communication: Mobility status;Patient requests pain meds PT Visit Diagnosis: Pain;Difficulty in walking, not elsewhere classified (R26.2) Pain - Right/Left: Right Pain - part of body: Ankle and joints of foot     Time: 1045-1058 PT Time Calculation (min) (ACUTE ONLY): 13 min  Charges:    Reeval 8-22 min                   G Codes:       Kristen Fromm,PT Acute Rehabilitation [phone] (pager)    Berline Lopes 03/28/2017, 12:10 PM

## 2017-03-28 NOTE — Progress Notes (Signed)
Transitions of Care Pharmacy Note  Plan:  Educated on Lantus and Metformin (as well as blood sugar monitoring) Outpatient follow-up: blood glucose, A1c --------------------------------------------- Antonio Richardsaniel Watson Melamed Jr. is an 61 y.o. male who presents with a chief complaint of toe infection. In anticipation of discharge, pharmacy has reviewed this patient's prior to admission medication history, as well as current inpatient medications listed per the [facility].  Current medication indications, dosing, frequency, and notable side effects reviewed with patient. patient verbalized understanding of current inpatient medication regimen and is aware that the After Visit Summary when presented, will represent the most accurate medication list at discharge.   Antonio Louisaniel Watson [place] Jr. expressed concerns regarding when he could be discharged. We briefly discussed changes to his medications including increasing Metformin to twice daily and decreasing Lantus to 5 units nightly. Encouraged patient to check blood glucoses TID and to monitor for hypoglycemia. Patient reports that he holds his insulin if blood glucose <100. He is aware that he needs to make a follow-up appt with his PCP next week.   He reports no further questions or concerns at the conclusion of out visit.   Assessment: Understanding of regimen: fair Understanding of indications: fair Potential of compliance: fair Barriers to Obtaining Medications: No  Patient instructed to contact inpatient pharmacy team with further questions or concerns if needed.    Time spent preparing for discharge counseling: 10 min  Time spent counseling patient: 15 min    York CeriseKatherine Cook, PharmD Pharmacy Resident  Pager [phone] 03/28/17 12:34 PM

## 2017-03-28 NOTE — Progress Notes (Signed)
pt ready for discharge to home.Mother in to transport. All personal belongings with pt. No distress noted.  Discharge instructions and rx reviewed with pt

## 2017-03-28 NOTE — Discharge Summary (Signed)
Family Medicine Teaching Service [facility] Discharge Summary  Patient name: Antonio [name]. Medical record number: [id number] Date of birth: [birth year] Age: 61 y.o. Gender: male Date of Admission: 03/24/2017  Date of Discharge: 03/28/2017 Admitting Physician: Carney Living, MD  Primary Care Provider: Dema Severin, NP Consultants:  Orthopedics  Indication for Hospitalization: Osteomyelitis of right first metatarsal  Discharge Diagnoses/Problem List:  Patient Active Problem List   Diagnosis Date Noted  . 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 Acute osteomyelitis of metatarsal bone of right foot (HCC)   . Abscess and cellulitis   . Edema   . Wound infection   . Cellulitis 12/28/2015    Disposition: Home  Discharge Condition: Stable  Discharge Exam:  Temp:  [97 F (36.1 C)-98.2 F (36.8 C)] 98.2 F (36.8 C) (05/03 0448) Pulse Rate:  [62-79] 70 (05/03 0448) Resp:  [17-20] 18 (05/03 0448) BP: (99-149)/(64-79) 135/64 (05/03 0448)  SpO2:  [95 %-98 %] 97 % (05/03 0448) Physical Exam: General: NAD, sits comfortably, in good spirits Cardiovascular: RRR, no m/r/g Respiratory: CTA bil, no W/R/R Abdomen: soft and nontender, nondistended Extremities: Bandaging over right foot, dried blood, otherwise clean  Brief Hospital Course:  Antonio Cohenis a 61 y.o.malepresenting with Right great toe stump infection. PMH is significant for T2DM (Last A1c 11.7 in 12/2015), HTN, HLD, chronic hyponatremia, history DVT's per patient, CAD/PAD, tobacco use  Patient was hospitalized with right great metatarsal pain and foul odor. He underwent XR of the right foot that was suspicious for osteomyelitis, which was subsequently confirmed on MRI of the foot.  He was stable with no leukocytosis, no fevers, no vital sign changes.  He was managed on Vancomycin and levoquin (allergy to penicillins noted in Epic). He underwent 1st Ray amputation of the right food by  orthopedics. He tolerated this procedure well and was discharged home the day after the surgery.   For his diabetes, patient was noted to have an HbA1c of 12.3 this admission. He was uncertain of his home diabetes regimen, stating that he only takes metformin each day, though later stating he takes lantus as well. He changed his report of his outpatient regimen multiple times. Ultimately he was restarted on metformin at the dose 1000 mg BID and discharged with 5 units of lantus to take nightly.  It is very likely that lantus will need to be titrated up after discharge.    Issues for Follow Up:  1. Patient was discharged with 15 pills of oxycodone 5 mg for pain control over the next 5 days. 2. Patient to follow up with orthopedics within the next week 3. Patient's diabetes regimen was very unclear and he was unable to provide a history. He was discharged on metformin 1000 mg BID and Lantus 5u nightly that will likely need to be titrated up as an outpatient.  Significant Procedures: 1st Ray Amputation Right Foot  Significant Labs and Imaging:   Recent Labs Lab 03/26/17 0440 03/27/17 0529 03/28/17 0436  WBC 4.9 4.9 6.6  HGB 15.2 15.0 14.4  HCT 45.3 43.9 44.4  PLT 180 146* 172    Recent Labs Lab 03/24/17 2026  03/25/17 0328 03/26/17 0440 03/27/17 0529 03/28/17 0436  NA 127*  --  133* 134* 134* 135  K 5.6*  < > 4.3 4.0 4.0 4.8  CL 93*  --  97* 101 103 99*  CO2 23  --  27 24 24 28   GLUCOSE 409*  --  347* 229* 208* 211*  BUN 13  --  12 15 10 12   CREATININE 0.74  --  0.66 0.60* 0.57* 0.73  CALCIUM 8.9  --  9.0 8.6* 8.8* 8.6*  ALKPHOS 84  --   --   --   --   --   AST 36  --   --   --   --   --   ALT 24  --   --   --   --   --   ALBUMIN 3.7  --   --   --   --   --   < > = values in this interval not displayed.  Dg Foot Complete Right 03/24/2017 CLINICAL DATA: Probable infection at the amputation site. EXAM: RIGHT FOOT COMPLETE - 3+ VIEW COMPARISON: 12/03/2014 FINDINGS: Prior  first digit amputation at the first MTP joint. There is a soft tissue ulcer at the amputation stump. There is loss of bony cortex at the medial aspect of the first metatarsal head, with mild underlying sclerosis. This is suspicious for osteomyelitis.  IMPRESSION:Suspicious for osteomyelitis involving the first metatarsal head. There also is an overlying soft tissue ulcer of the amputation stump.   MRI foot  03/25/2017 IMPRESSION: First MTP joint amputation with adjacent medial soft tissue ulceration. This in conjunction with cortical irregularity, edema and enhancement involving the first metatarsal head are keeping with acute osteomyelitis.    Results/Tests Pending at Time of Discharge: None  Discharge Medications:  Allergies as of 03/28/2017      Reactions   Iodinated Diagnostic Agents Hives, Rash, Other (See Comments)   Blisters Staph   Penicillins Other (See Comments)   UNSPECIFIED REACTION       Medication List    STOP taking these medications   HYDROcodone-acetaminophen 5-325 MG tablet Commonly known as:  NORCO/VICODIN   oxyCODONE-acetaminophen 5-325 MG tablet Commonly known as:  ROXICET     TAKE these medications   atorvastatin 40 MG tablet Commonly known as:  LIPITOR Take 1 tablet (40 mg total) by mouth daily at 6 PM.   insulin glargine 100 UNIT/ML injection Commonly known as:  LANTUS Inject 0.05 mLs (5 Units total) into the skin at bedtime. What changed:  how much to take   metFORMIN 1000 MG tablet Commonly known as:  GLUCOPHAGE Take 1 tablet (1,000 mg total) by mouth 2 (two) times daily with a meal.   oxyCODONE 5 MG immediate release tablet Commonly known as:  Oxy IR/ROXICODONE Take 1 tablet (5 mg total) by mouth every 8 (eight) hours as needed for breakthrough pain.       Discharge Instructions: Please refer to Patient Instructions section of EMR for full details.  Patient was counseled important signs and symptoms that should prompt return to medical care,  changes in medications, dietary instructions, activity restrictions, and follow up appointments.   Follow-Up Appointments: Follow-up Information    [name], MD Follow up in 1 week(s).   Specialty:  Orthopedic Surgery Contact information: [address] [place] [address] [phone]        [name],[name], NP Follow up.   Why:  Please call and schedule a follow up with your doctor to be seen within one week. Contact information: [address] [place] [address] [phone]           Howard PouchLauren Sonu Kruckenberg, MD 03/28/2017, 3:17 PM PGY-1, [facility]

## 2017-03-28 NOTE — Progress Notes (Signed)
Family Medicine Teaching Service Daily Progress Note Intern Pager: [phone]  Patient name: Antonio RichardsDaniel Watson Lentsch Jr. Medical record number: [id number] Date of birth: January 23, 1956 Age: 61 y.o. Gender: male  Primary Care Provider: [name],[name], NP Consultants: Orthopedics Code Status: Full  Pt Overview and Major Events to Date:  4/29 - admit to FPTS  5/2 R foot ray amputation  Assessment and Plan: Antonio RichardsDaniel Watson Thorup Jr. is a 61 y.o. male presenting with Right great toe stump infection. PMH is significant for T2DM (Last A1c 11.7 in 12/2015), HTN, HLD, chronic hyponatremia, history DVT's per patient, CAD/PAD, tobacco use  Osteomyelitis R first metatarsal: Afebrile, vitals stable. No leukocytosis. Osteo confirmed on MR R foot. POD #1 s/p amputation right foot. - wound culture - abundant gram positive cocci in pair, mod gram variable rod, culture nl skin flora, mod prevotella, beta lactamase positive - WOC consult - s/p Vanc/Levo (4/30-5/2) - now discontinued - monitor vitals per floor protocol  - plan for DC today  Diabetes, poorly controlled: HbA1c 12.3 this admission. Uncertain outpatient regimen. Patient endorses only taking metformin BID which is not seen in epic. CBG 250, 210 overnight, required 13u short-acting insulin - holding home regimen - restarted metformin 1000 mg BID after surgery - patient would likely benefit from insulin for tighter control but refuses  Hyponatremia, chronic: Sodium stable at 135 this AM from 127 at admission. Previously worked up at admission one year ago with serum osm, urine osm, and urine sodium WNL at that time. - IVNS at 125 cc/hr - AM BMP  Hypertension, currently normotensive: Patient states he does not know his home medication, but endorses taking a pill daily. Per chart review, patient previously on Coreg 6.25mg  BID, lisinopril 10 mg BID, and metoprolol 25 mg also seen on this list.  Uncertain of most up-to-date medication list. - Hold home med -  monitor BP  CAD/PAD/HLD - patient notes history of stent placement in left leg, balloon procedure in right leg. Endorses claudication. No lipid panel per chart.  Previously was on atorvastatin 40 mg daily, unclear whether he is still taking this. - continue statin  Tobacco Abuse: Smokes 2ppd. Refuses nicotine patch at admission. - Smoking cessation counseling during hospitalization  DVTs: History reported by patient on year ago, not endorsed this admission. Takes ASA 81 mg daily. Previous notes indicate he was on plavix 75 mg.  - Holding ASA, Plavix due to possible surgery - Heparin SQ  FEN/GI: IVNS at 125 cc/hr, NPO at midnight for procedure 5/2 Prophylaxis: heparin  Disposition: For surgery by ortho  Subjective:  No acute events overnight. Wants to go home.  Objective: Temp:  [97 F (36.1 C)-98.2 F (36.8 C)] 98.2 F (36.8 C) (05/03 0448) Pulse Rate:  [62-79] 70 (05/03 0448) Resp:  [17-20] 18 (05/03 0448) BP: (99-149)/(64-79) 135/64 (05/03 0448) SpO2:  [95 %-98 %] 97 % (05/03 0448) Physical Exam: General: NAD, sits comfortably, in good spirits Cardiovascular: RRR, no m/r/g Respiratory: CTA bil, no W/R/R Abdomen: soft and nontender, nondistended Extremities: Bandaging over right foot, dried blood, otherwise clean  Laboratory:  Recent Labs Lab 03/26/17 0440 03/27/17 0529 03/28/17 0436  WBC 4.9 4.9 6.6  HGB 15.2 15.0 14.4  HCT 45.3 43.9 44.4  PLT 180 146* 172    Recent Labs Lab 03/24/17 2026  03/26/17 0440 03/27/17 0529 03/28/17 0436  NA 127*  < > 134* 134* 135  K 5.6*  < > 4.0 4.0 4.8  CL 93*  < > 101 103 99*  CO2 23  < > 24 24 28   BUN 13  < > 15 10 12   CREATININE 0.74  < > 0.60* 0.57* 0.73  CALCIUM 8.9  < > 8.6* 8.8* 8.6*  PROT 5.9*  --   --   --   --   BILITOT 2.5*  --   --   --   --   ALKPHOS 84  --   --   --   --   ALT 24  --   --   --   --   AST 36  --   --   --   --   GLUCOSE 409*  < > 229* 208* 211*  < > = values in this interval not  displayed. Imaging/Diagnostic Tests: Dg Foot Complete Right 03/24/2017 CLINICAL DATA:  Probable infection at the amputation site. EXAM: RIGHT FOOT COMPLETE - 3+ VIEW COMPARISON:  12/03/2014 FINDINGS: Prior first digit amputation at the first MTP joint. There is a soft tissue ulcer at the amputation stump. There is loss of bony cortex at the medial aspect of the first metatarsal head, with mild underlying sclerosis. This is suspicious for osteomyelitis.  IMPRESSION: Suspicious for osteomyelitis involving the first metatarsal head. There also is an overlying soft tissue ulcer of the amputation stump.    Howard Pouch, MD 03/28/2017, 11:45 AM PGY-1, [facility] Family Medicine FPTS Intern pager: [phone], text pages welcome

## 2017-04-04 ENCOUNTER — Ambulatory Visit (INDEPENDENT_AMBULATORY_CARE_PROVIDER_SITE_OTHER): Payer: Medicaid Other | Admitting: Family

## 2017-04-04 ENCOUNTER — Encounter (INDEPENDENT_AMBULATORY_CARE_PROVIDER_SITE_OTHER): Payer: Self-pay | Admitting: Orthopedic Surgery

## 2017-04-04 DIAGNOSIS — L97521 Non-pressure chronic ulcer of other part of left foot limited to breakdown of skin: Secondary | ICD-10-CM | POA: Diagnosis not present

## 2017-04-04 DIAGNOSIS — Z89431 Acquired absence of right foot: Secondary | ICD-10-CM

## 2017-04-04 NOTE — Progress Notes (Signed)
Office Visit Note   Patient: Antonio [name].           Date of Birth: [birth year]           MRN: [id number] Visit Date: 04/04/2017              Requested by: Dema Severin, NP [address] [place], [place] [address] PCP: Dema Severin, NP  Chief Complaint  Patient presents with  . Right Foot - Follow-up    03/27/17 Right foot 1st ray amputation  . Left Foot - Follow-up    Transmet Amputation with callus       HPI: The patient is a 61 year old gentleman seen status post right first ray amputation. States he returned to work 2 days after hospital discharge. Has been full weight bearing in regular shoe wear. Has been elevating as able. Would also like an ulceration to the left forefoot examined today. Is status post a remote transmetatarsal amputation on left. Has plantar ulceration with callus of forefoot. Is also full weight bearing on left foot. Is using silvadene for daily dressing changes.   Assessment & Plan: Visit Diagnoses:  1. Ulcer of toe of left foot, limited to breakdown of skin (HCC)   2. Partial nontraumatic amputation of foot, right (HCC)     Plan: for the right first ray amputation is to begin daily dial soap cleansing. Apply dry dressing and ace wrap. Minimize weight bearing and elevate. Will follow up in 2 weeks for suture removal. The right foot callus was pared. Is to cleanse daily and apply silvadene dressings. Minimize weight bearing as able.   Follow-Up Instructions: Return in about 2 weeks (around 04/18/2017).   Ortho Exam  Patient is alert, oriented, no adenopathy, well-dressed, normal affect, normal respiratory effort. Right foot: incision is  Approximated with sutures. There is surrounding maceration. Some gaping distally. There is scant serosanguinous drainage. No odor. No sign of infection. Left foot: ulceration beneath the distal first metatarsal. There is callused ulceration. Callus is 3 cm in diameter and 5 mm thick. Central ulceration is 1 cm in  diameter and 4 mm deep. There is no drainage. This was pared back to viable tissue. No surrounding erythema. No odor. No sign of infection. Unable to palpate a DP or PT pulse.   States has been to Vascular and has adequate flow.   Imaging: No results found.  Labs: Lab Results  Component Value Date   HGBA1C 12.3 (H) 03/25/2017   HGBA1C 11.7 (H) 12/28/2015   ESRSEDRATE 17 (H) 03/24/2017   ESRSEDRATE 85 (H) 12/28/2015   CRP 2.2 (H) 03/24/2017   CRP 27.2 (H) 12/28/2015   REPTSTATUS 03/28/2017 FINAL 03/24/2017   GRAMSTAIN  03/24/2017    NO WBC SEEN ABUNDANT GRAM POSITIVE COCCI IN PAIRS MODERATE GRAM VARIABLE ROD    CULT  03/24/2017    NORMAL SKIN FLORA MODERATE PREVOTELLA BIVIA BETA LACTAMASE POSITIVE     Orders:  No orders of the defined types were placed in this encounter.  No orders of the defined types were placed in this encounter.    Procedures: No procedures performed  Clinical Data: No additional findings.  ROS:  All other systems negative, except as noted in the HPI. Review of Systems  Constitutional: Negative for chills and fever.  Cardiovascular: Negative for leg swelling.  Skin: Positive for wound. Negative for color change.    Objective: Vital Signs: There were no vitals taken for this visit.  Specialty  Comments:  No specialty comments available.  PMFS History: Patient Active Problem List   Diagnosis Date Noted  . Ulcer of toe of left foot, limited to breakdown of skin (HCC) 04/04/2017  . 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 Edema    Past Medical History:  Diagnosis Date  . Coronary artery disease   . Diabetes mellitus without complication (HCC)   . Hyperlipidemia   . Hypertension   . Osteomyelitis (HCC) 03/25/2017   RT FOOT    Family History  Problem Relation Age of Onset  . Diabetes Mother   . Emphysema Father     Past Surgical History:    Procedure Laterality Date  . AMPUTATION Right 03/27/2017   Procedure: 1st Ray Amputation Right Foot;  Surgeon: Nadara MustardMarcus Duda V, MD;  [facility];  Service: Orthopedics;  Laterality: Right;  . APPENDECTOMY    . CARDIAC CATHETERIZATION    . CORONARY STENT INTERVENTION  2005  . Great toe amputation right.    . I&D EXTREMITY Left 12/30/2015   Procedure: IRRIGATION AND DEBRIDEMENT LEFT FOOT, TRANSMETATARSAL AMPUTATION WITH APPLICATION OF ANTIBIOTIC BEADS AND WOUND VAC;  Surgeon: Nadara MustardMarcus Duda V, MD;  Location: [facility] OR;  Service: Orthopedics;  Laterality: Left;  . TONSILLECTOMY     Social History   Occupational History  . Not on file.   Social History Main Topics  . Smoking status: Current Every Day Smoker    Packs/day: 2.00    Years: 35.00    Types: Cigarettes  . Smokeless tobacco: Never Used  . Alcohol use Yes     Comment: beers occasionally  . Drug use: No  . Sexual activity: Not on file

## 2017-04-15 ENCOUNTER — Encounter (INDEPENDENT_AMBULATORY_CARE_PROVIDER_SITE_OTHER): Payer: Self-pay | Admitting: Orthopedic Surgery

## 2017-04-15 ENCOUNTER — Ambulatory Visit (INDEPENDENT_AMBULATORY_CARE_PROVIDER_SITE_OTHER): Payer: Medicaid Other | Admitting: Orthopedic Surgery

## 2017-04-15 VITALS — Ht 72.0 in | Wt 230.0 lb

## 2017-04-15 DIAGNOSIS — Z89432 Acquired absence of left foot: Secondary | ICD-10-CM

## 2017-04-15 DIAGNOSIS — Z89411 Acquired absence of right great toe: Secondary | ICD-10-CM

## 2017-04-15 DIAGNOSIS — S98111A Complete traumatic amputation of right great toe, initial encounter: Secondary | ICD-10-CM

## 2017-04-15 DIAGNOSIS — I87323 Chronic venous hypertension (idiopathic) with inflammation of bilateral lower extremity: Secondary | ICD-10-CM

## 2017-04-15 NOTE — Progress Notes (Signed)
Office Visit Note   Patient: Antonio [name].           Date of Birth: 1956-05-16           MRN: [id number] Visit Date: 04/15/2017              Requested by: Dema Severin, NP [address] [place], [place] [address] PCP: Dema Severin, NP  Chief Complaint  Patient presents with  . Right Foot - Follow-up    03/27/17 1st ray amputation      HPI:  patient is status post a right foot first ray amputation and a left transmetatarsal amputation. 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 He states his primary care physician provided a antibiotic injection today. Patient denies any fever or chills.  Assessment & Plan: Visit Diagnoses:  1. S/P transmetatarsal amputation of foot, left (HCC)   2. Amputated great toe of right foot (HCC)     Plan:  Ulcer debridement on left foot. Patient has massive venous stasis swelling of both legs and he will go to [facility] to obtain compression stockings he'll wear these around-the-clock elevated both legs close follow-up in 1 week to ensure that we do not need oral antibiotics  Follow-Up Instructions: Return in about 1 week (around 04/22/2017).   Ortho Exam  Patient is alert, oriented, no adenopathy, well-dressed, normal affect, normal respiratory effort.  patient has an antalgic gait. Examination left foot is a Wagner grade 1 ulcer after informed consent a 10 blade knife was used to debride the skin and soft tissue back to bleeding viable relation tissue. The ulcer is 2 cm in diameter 5 mm deep Silver nitrate was used for hemostasis. Examination the right foot he has dermatitis secondary to venous swelling the wound edges are gaped open about 3 mm with no depth. There is no odor no cellulitis no drainage. Both legs have massive venous stasis swelling but no open ulcers.  Imaging: No results found.  Labs: Lab Results  Component Value Date   HGBA1C 12.3 (H)  03/25/2017   HGBA1C 11.7 (H) 12/28/2015   ESRSEDRATE 17 (H) 03/24/2017   ESRSEDRATE 85 (H) 12/28/2015   CRP 2.2 (H) 03/24/2017   CRP 27.2 (H) 12/28/2015   REPTSTATUS 03/28/2017 FINAL 03/24/2017   GRAMSTAIN  03/24/2017    NO WBC SEEN ABUNDANT GRAM POSITIVE COCCI IN PAIRS MODERATE GRAM VARIABLE ROD    CULT  03/24/2017    NORMAL SKIN FLORA MODERATE PREVOTELLA BIVIA BETA LACTAMASE POSITIVE     Orders:  No orders of the defined types were placed in this encounter.  No orders of the defined types were placed in this encounter.    Procedures: No procedures performed  Clinical Data: No additional findings.  ROS:  All other systems negative, except as noted in the HPI. Review of Systems  Objective: Vital Signs: Ht 6' (1.829 m)   Wt 230 lb (104.3 kg)   BMI 31.19 kg/m   Specialty Comments:  No specialty comments available.  PMFS History: Patient Active Problem List   Diagnosis Date Noted  . 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 Amputated great toe of right foot (HCC) 04/15/2017  . Ulcer of toe of left foot, limited to breakdown of skin (HCC) 04/04/2017  . 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 Type 2 diabetes mellitus with skin  complication (HCC)   . Edema    Past Medical History:  Diagnosis Date  . Coronary artery disease   . Diabetes mellitus without complication (HCC)   . Hyperlipidemia   . Hypertension   . Osteomyelitis (HCC) 03/25/2017   RT FOOT    Family History  Problem Relation Age of Onset  . Diabetes Mother   . Emphysema Father     Past Surgical History:  Procedure Laterality Date  . AMPUTATION Right 03/27/2017   Procedure: 1st Ray Amputation Right Foot;  Surgeon: Nadara MustardMarcus Duda V, MD;  [facility];  Service: Orthopedics;  Laterality: Right;  . APPENDECTOMY    . CARDIAC CATHETERIZATION    . CORONARY STENT INTERVENTION  2005  . Great toe amputation right.    . I&D EXTREMITY Left 12/30/2015     Procedure: IRRIGATION AND DEBRIDEMENT LEFT FOOT, TRANSMETATARSAL AMPUTATION WITH APPLICATION OF ANTIBIOTIC BEADS AND WOUND VAC;  Surgeon: Nadara MustardMarcus Duda V, MD;  Location: [facility] OR;  Service: Orthopedics;  Laterality: Left;  . TONSILLECTOMY     Social History   Occupational History  . Not on file.   Social History Main Topics  . Smoking status: Current Every Day Smoker    Packs/day: 2.00    Years: 35.00    Types: Cigarettes  . Smokeless tobacco: Never Used  . Alcohol use Yes     Comment: beers occasionally  . Drug use: No  . Sexual activity: Not on file

## 2017-04-15 NOTE — Addendum Note (Signed)
Addended by: Donalee CitrinPEELE, STEPHENEY L on: 04/15/2017 01:32 PM   Modules accepted: Orders

## 2017-04-18 ENCOUNTER — Ambulatory Visit (INDEPENDENT_AMBULATORY_CARE_PROVIDER_SITE_OTHER): Payer: Medicaid Other | Admitting: Family

## 2017-04-24 ENCOUNTER — Ambulatory Visit (INDEPENDENT_AMBULATORY_CARE_PROVIDER_SITE_OTHER): Payer: Medicaid Other | Admitting: Family

## 2017-04-24 ENCOUNTER — Encounter (INDEPENDENT_AMBULATORY_CARE_PROVIDER_SITE_OTHER): Payer: Self-pay | Admitting: Family

## 2017-04-24 VITALS — Ht 72.0 in | Wt 230.0 lb

## 2017-04-24 DIAGNOSIS — Z89411 Acquired absence of right great toe: Secondary | ICD-10-CM

## 2017-04-24 DIAGNOSIS — E11621 Type 2 diabetes mellitus with foot ulcer: Secondary | ICD-10-CM

## 2017-04-24 DIAGNOSIS — S98111A Complete traumatic amputation of right great toe, initial encounter: Secondary | ICD-10-CM

## 2017-04-24 DIAGNOSIS — Z89432 Acquired absence of left foot: Secondary | ICD-10-CM

## 2017-04-24 DIAGNOSIS — L97509 Non-pressure chronic ulcer of other part of unspecified foot with unspecified severity: Secondary | ICD-10-CM

## 2017-04-24 MED ORDER — DOXYCYCLINE HYCLATE 100 MG PO TABS: 100.0000 mg | ORAL_TABLET | Freq: Two times a day (BID) | ORAL | 0 refills | Status: DC

## 2017-04-24 NOTE — Progress Notes (Signed)
Office Visit Note   Patient: Antonio RichardsDaniel Watson Cothron Jr.           Date of Birth: [birth year]           MRN: [id number] Visit Date: 04/24/2017              Requested by: [name], [name], NP [address] [place], [place] [id number] PCP: [name], [name], NP  Chief Complaint  Patient presents with  . Right Foot - Follow-up    03/27/17 1st ray amputation ~4 weeks post op  . Left Foot - Follow-up    Left transmetatarsal amputation ulceration      HPI:  The patient is a 61 year old gentleman who is status post a right foot first ray amputation and a remote left transmetatarsal amputation. He has a Wagner grade 1 ulcer on the left foot he states secondary to putting all the pressure on his left foot. He states his primary care physician provided an prescription for tetracycline 6 days ago. States he will be out in 4 days.  Has gotten the medical compression stockings. States wears this daily. Is not wearing them today. Has been unable to offload either feet. States feels like occasionally animal if he tries to stay off of his feet.   Assessment & Plan: Visit Diagnoses:  1. Amputated great toe of right foot (HCC)   2. S/P transmetatarsal amputation of foot, left (HCC)     Plan:  Ulcer debridement on left foot. Patient has venous stasis swelling of both legs and [facility] compression stockings he'll wear these around-the-clock elevated both legs. Discussed at length the importance of nonweightbearing. Discussed the critical nature of the ulcer on the left foot. Discussed the risk for requiring further limb salvage surgery in the future.  Follow-Up Instructions: No Follow-up on file.   Ortho Exam  Patient is alert, oriented, no adenopathy, well-dressed, normal affect, normal respiratory effort.  patient has an antalgic gait. Examination left foot is a Wagner grade 1 ulcer after informed consent a 10 blade knife was used to debride the skin and soft tissue back  to bleeding viable relation tissue. The ulcer is 5 cm x 2 cm in diameter 5 mm deep. This probes to tendon. There is necrotic tissue in wound bed. Silver nitrate was used for hemostasis. Examination the right foot he has dermatitis secondary to venous swelling the wound edges are gaped open about 5 mm with no depth the full length of thefirst ray amputation incision. There is surrounding erythema. There is a foul odor. No ascending cellulitis no drainage. Both legs have massive venous stasis swelling but no open ulcers.  Imaging: No results found.  Labs: Lab Results  Component Value Date   HGBA1C 12.3 (H) 03/25/2017   HGBA1C 11.7 (H) 12/28/2015   ESRSEDRATE 17 (H) 03/24/2017   ESRSEDRATE 85 (H) 12/28/2015   CRP 2.2 (H) 03/24/2017   CRP 27.2 (H) 12/28/2015   REPTSTATUS 03/28/2017 FINAL 03/24/2017   GRAMSTAIN  03/24/2017    NO WBC SEEN ABUNDANT GRAM POSITIVE COCCI IN PAIRS MODERATE GRAM VARIABLE ROD    CULT  03/24/2017    NORMAL SKIN FLORA MODERATE PREVOTELLA BIVIA BETA LACTAMASE POSITIVE     Orders:  No orders of the defined types were placed in this encounter.  No orders of the defined types were placed in this encounter.    Procedures: No procedures performed  Clinical Data: No additional findings.  ROS:  All other systems negative, except as noted in the HPI. Review of Systems  Constitutional: Negative for chills and fever.  Cardiovascular: Positive for leg swelling.  Skin: Positive for rash and wound.    Objective: Vital Signs: Ht 6' (1.829 m)   Wt 230 lb (104.3 kg)   BMI 31.19 kg/m   Specialty Comments:  No specialty comments available.  PMFS History: Patient Active Problem List   Diagnosis Date Noted  . 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 Amputated great toe of right foot (HCC) 04/15/2017  . Ulcer of toe of left foot, limited to breakdown of skin (HCC) 04/04/2017  . Partial nontraumatic amputation of foot, right (HCC)  04/04/2017  . 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 Edema    Past Medical History:  Diagnosis Date  . Coronary artery disease   . Diabetes mellitus without complication (HCC)   . Hyperlipidemia   . Hypertension   . Osteomyelitis (HCC) 03/25/2017   RT FOOT    Family History  Problem Relation Age of Onset  . Diabetes Mother   . Emphysema Father     Past Surgical History:  Procedure Laterality Date  . AMPUTATION Right 03/27/2017   Procedure: 1st Ray Amputation Right Foot;  Surgeon: Nadara Mustard, MD;  Location: [facility] OR;  Service: Orthopedics;  Laterality: Right;  . APPENDECTOMY    . CARDIAC CATHETERIZATION    . CORONARY STENT INTERVENTION  2005  . Great toe amputation right.    . I&D EXTREMITY Left 12/30/2015   Procedure: IRRIGATION AND DEBRIDEMENT LEFT FOOT, TRANSMETATARSAL AMPUTATION WITH APPLICATION OF ANTIBIOTIC BEADS AND WOUND VAC;  Surgeon: Nadara Mustard, MD;  Location: [facility] OR;  Service: Orthopedics;  Laterality: Left;  . TONSILLECTOMY     Social History   Occupational History  . Not on file.   Social History Main Topics  . Smoking status: Current Every Day Smoker    Packs/day: 2.00    Years: 35.00    Types: Cigarettes  . Smokeless tobacco: Never Used  . Alcohol use Yes     Comment: beers occasionally  . Drug use: No  . Sexual activity: Not on file

## 2017-04-26 NOTE — Addendum Note (Signed)
Addendum  created 04/26/17 1434 by Janelie Goltz, MD   Sign clinical note    

## 2017-05-08 ENCOUNTER — Encounter (INDEPENDENT_AMBULATORY_CARE_PROVIDER_SITE_OTHER): Payer: Self-pay | Admitting: Family

## 2017-05-08 ENCOUNTER — Ambulatory Visit (INDEPENDENT_AMBULATORY_CARE_PROVIDER_SITE_OTHER): Payer: Medicaid Other | Admitting: Family

## 2017-05-08 VITALS — Ht 72.0 in | Wt 230.0 lb

## 2017-05-08 DIAGNOSIS — E11621 Type 2 diabetes mellitus with foot ulcer: Secondary | ICD-10-CM

## 2017-05-08 DIAGNOSIS — L97509 Non-pressure chronic ulcer of other part of unspecified foot with unspecified severity: Secondary | ICD-10-CM

## 2017-05-08 DIAGNOSIS — Z89431 Acquired absence of right foot: Secondary | ICD-10-CM | POA: Diagnosis not present

## 2017-05-08 DIAGNOSIS — Z89432 Acquired absence of left foot: Secondary | ICD-10-CM

## 2017-05-08 NOTE — Progress Notes (Signed)
Office Visit Note   Patient: Antonio RichardsDaniel Watson Mankins Jr.           Date of Birth: Feb 26, 1956           MRN: [id number] Visit Date: 05/08/2017              Requested by: [name], [name], NP [address] [place], [place] [id number] PCP: [name], [name], NP  Chief Complaint  Patient presents with  . Right Foot - Routine Post Op    03/27/17 right foot 1st ray amputation  . Left Foot - Wound Check    Pt is full wtb and has been without a dressing as there are grass clippings imbedded in the open wound.       HPI: The patient is a 61 year old gentleman who is status post a right foot first ray amputation and a remote left transmetatarsal amputation. He has a Wagner grade 1 ulcer on the left foot beneath the transmet. he states secondary to putting all the pressure on his left foot.   Has been unable to offload either feet. Is full weight bearing against advice.   Assessment & Plan: Visit Diagnoses:  No diagnosis found.  Plan:  Ulcer debridement on left foot. iodosorb dressings applied to both foot wounds.  Discussed at length the importance of nonweightbearing. Discussed the critical nature of the ulcer on the left foot. Discussed the risk for requiring further limb salvage surgery in the future.  Follow-Up Instructions: No Follow-up on file.   Ortho Exam  Patient is alert, oriented, no adenopathy, well-dressed, normal affect, normal respiratory effort.  patient has an antalgic gait. Examination left foot is a Wagner grade 1 ulcer after informed consent a 10 blade knife was used to debride the skin and soft tissue back to bleeding viable relation tissue. The ulcer is 3 cm x 2 cm in diameter 5 mm deep. This probes to tendon. There is necrotic tissue in wound bed. Silver nitrate was used for hemostasis. Examination the right foot he has dermatitis secondary to venous swelling the wound edges are gaped open about 5 mm with no depth, has epithelialization 15 mm proximally along the first ray  amputation incision. There is surrounding erythema. There is continued foul odor. No ascending cellulitis no drainage. Both legs have venous stasis swelling but no open ulcers.  Imaging: No results found.  Labs: Lab Results  Component Value Date   HGBA1C 12.3 (H) 03/25/2017   HGBA1C 11.7 (H) 12/28/2015   ESRSEDRATE 17 (H) 03/24/2017   ESRSEDRATE 85 (H) 12/28/2015   CRP 2.2 (H) 03/24/2017   CRP 27.2 (H) 12/28/2015   REPTSTATUS 03/28/2017 FINAL 03/24/2017   GRAMSTAIN  03/24/2017    NO WBC SEEN ABUNDANT GRAM POSITIVE COCCI IN PAIRS MODERATE GRAM VARIABLE ROD    CULT  03/24/2017    NORMAL SKIN FLORA MODERATE PREVOTELLA BIVIA BETA LACTAMASE POSITIVE     Orders:  No orders of the defined types were placed in this encounter.  No orders of the defined types were placed in this encounter.    Procedures: No procedures performed  Clinical Data: No additional findings.  ROS:  All other systems negative, except as noted in the HPI. Review of Systems  Constitutional: Negative for chills and fever.  Cardiovascular: Positive for leg swelling.  Skin: Positive for rash and wound.    Objective: Vital Signs: Ht 6' (1.829 m)   Wt 230 lb (104.3 kg)   BMI 31.19 kg/m   Specialty Comments:  No specialty comments available.  PMFS History: Patient Active Problem List   Diagnosis Date Noted  . 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 Amputated great toe of right foot (HCC) 04/15/2017  . Ulcer of toe of left foot, limited to breakdown of skin (HCC) 04/04/2017  . 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 Edema    Past Medical History:  Diagnosis Date  . Coronary artery disease   . Diabetes mellitus without complication (HCC)   . Hyperlipidemia   . Hypertension   . Osteomyelitis (HCC) 03/25/2017   RT FOOT    Family History  Problem Relation Age of Onset  . Diabetes  Mother   . Emphysema Father     Past Surgical History:  Procedure Laterality Date  . AMPUTATION Right 03/27/2017   Procedure: 1st Ray Amputation Right Foot;  Surgeon: Nadara Mustard, MD;  Location: [facility] OR;  Service: Orthopedics;  Laterality: Right;  . APPENDECTOMY    . CARDIAC CATHETERIZATION    . CORONARY STENT INTERVENTION  2005  . Great toe amputation right.    . I&D EXTREMITY Left 12/30/2015   Procedure: IRRIGATION AND DEBRIDEMENT LEFT FOOT, TRANSMETATARSAL AMPUTATION WITH APPLICATION OF ANTIBIOTIC BEADS AND WOUND VAC;  Surgeon: Nadara Mustard, MD;  Location: [facility] OR;  Service: Orthopedics;  Laterality: Left;  . TONSILLECTOMY     Social History   Occupational History  . Not on file.   Social History Main Topics  . Smoking status: Current Every Day Smoker    Packs/day: 2.00    Years: 35.00    Types: Cigarettes  . Smokeless tobacco: Never Used  . Alcohol use Yes     Comment: beers occasionally  . Drug use: No  . Sexual activity: Not on file

## 2017-05-10 ENCOUNTER — Telehealth (INDEPENDENT_AMBULATORY_CARE_PROVIDER_SITE_OTHER): Payer: Self-pay | Admitting: Orthopedic Surgery

## 2017-05-10 ENCOUNTER — Other Ambulatory Visit (INDEPENDENT_AMBULATORY_CARE_PROVIDER_SITE_OTHER): Payer: Self-pay | Admitting: Family

## 2017-05-10 MED ORDER — DOXYCYCLINE HYCLATE 100 MG PO TABS: 100.0000 mg | ORAL_TABLET | Freq: Two times a day (BID) | ORAL | 0 refills | Status: DC

## 2017-05-10 NOTE — Telephone Encounter (Signed)
Patient is needing a refill on his antibiotics sent to his pharmacy. CB # [phone]

## 2017-05-10 NOTE — Telephone Encounter (Signed)
Do you want to refill abx?

## 2017-05-22 ENCOUNTER — Encounter (INDEPENDENT_AMBULATORY_CARE_PROVIDER_SITE_OTHER): Payer: Self-pay | Admitting: Family

## 2017-05-22 ENCOUNTER — Ambulatory Visit (INDEPENDENT_AMBULATORY_CARE_PROVIDER_SITE_OTHER): Payer: Medicaid Other | Admitting: Family

## 2017-05-22 VITALS — Ht 72.0 in | Wt 230.0 lb

## 2017-05-22 DIAGNOSIS — E11621 Type 2 diabetes mellitus with foot ulcer: Secondary | ICD-10-CM

## 2017-05-22 DIAGNOSIS — Z89432 Acquired absence of left foot: Secondary | ICD-10-CM

## 2017-05-22 DIAGNOSIS — L97521 Non-pressure chronic ulcer of other part of left foot limited to breakdown of skin: Secondary | ICD-10-CM | POA: Diagnosis not present

## 2017-05-22 DIAGNOSIS — Z89431 Acquired absence of right foot: Secondary | ICD-10-CM

## 2017-05-22 DIAGNOSIS — L97509 Non-pressure chronic ulcer of other part of unspecified foot with unspecified severity: Secondary | ICD-10-CM

## 2017-05-22 NOTE — Progress Notes (Signed)
Office Visit Note   Patient: Antonio RichardsDaniel Watson Tumbleson Jr.           Date of Birth: [birth year]           MRN: [id number] Visit Date: 05/22/2017              Requested by: [name], [name], NP [address] [place], [place] [id number] PCP: [name], [name], NP  Chief Complaint  Patient presents with  . Right Foot - Routine Post Op    03/27/17 right foot 1st ray amputation       HPI: The patient is a 61 year old gentleman who is status post a right foot first ray amputation and a remote left transmetatarsal amputation. He has a Wagner grade 1 ulcer on the left foot beneath the transmet.  Has been unable to offload either feet. Is full weight bearing against advice.   Assessment & Plan: Visit Diagnoses:  1. Ulcer of toe of left foot, limited to breakdown of skin (HCC)   2. Type 2 diabetes mellitus with foot ulcer, unspecified whether long term insulin use (HCC)   3. Partial nontraumatic amputation of foot, right (HCC)   4. S/P transmetatarsal amputation of foot, left (HCC)     Plan:  Ulcer debridement on left foot. iodosorb dressings applied to both foot wounds.  Discussed at length the importance of nonweightbearing. Discussed the critical nature of the ulcer on the left foot. Discussed the risk for requiring further limb salvage surgery in the future.  Follow-Up Instructions: Return in about 3 weeks (around 06/12/2017).   Ortho Exam  Patient is alert, oriented, no adenopathy, well-dressed, normal affect, normal respiratory effort. Patient has an antalgic gait. Examination left foot is a Wagner grade 1 ulcer after informed consent a 10 blade knife was used to debride the skin and soft tissue back to viable tissue. The ulcer is 3 cm x 2 cm in diameter 4 mm deep. This probes to tendon. Examination the right foot the wound edges are rolled, there is healing proximally. Distally there is gaping about 1 cm in length, is 4 mm wide, is 2 mm deep. No surrounding erythema or foul odor. No ascending  cellulitis no drainage. Both legs have venous stasis swelling but no open ulcers.  Imaging: No results found.  Labs: Lab Results  Component Value Date   HGBA1C 12.3 (H) 03/25/2017   HGBA1C 11.7 (H) 12/28/2015   ESRSEDRATE 17 (H) 03/24/2017   ESRSEDRATE 85 (H) 12/28/2015   CRP 2.2 (H) 03/24/2017   CRP 27.2 (H) 12/28/2015   REPTSTATUS 03/28/2017 FINAL 03/24/2017   GRAMSTAIN  03/24/2017    NO WBC SEEN ABUNDANT GRAM POSITIVE COCCI IN PAIRS MODERATE GRAM VARIABLE ROD    CULT  03/24/2017    NORMAL SKIN FLORA MODERATE PREVOTELLA BIVIA BETA LACTAMASE POSITIVE     Orders:  No orders of the defined types were placed in this encounter.  No orders of the defined types were placed in this encounter.    Procedures: No procedures performed  Clinical Data: No additional findings.  ROS:  All other systems negative, except as noted in the HPI. Review of Systems  Constitutional: Negative for chills and fever.  Cardiovascular: Positive for leg swelling.  Skin: Positive for wound.    Objective: Vital Signs: Ht 6' (1.829 m)   Wt 230 lb (104.3 kg)   BMI 31.19 kg/m   Specialty Comments:  No specialty comments available.  PMFS History: Patient Active Problem List   Diagnosis  Date Noted  . 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 Amputated great toe of right foot (HCC) 04/15/2017  . Ulcer of toe of left foot, limited to breakdown of skin (HCC) 04/04/2017  . 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 Edema    Past Medical History:  Diagnosis Date  . Coronary artery disease   . Diabetes mellitus without complication (HCC)   . Hyperlipidemia   . Hypertension   . Osteomyelitis (HCC) 03/25/2017   RT FOOT    Family History  Problem Relation Age of Onset  . Diabetes Mother   . Emphysema Father     Past Surgical History:  Procedure Laterality Date  . AMPUTATION  Right 03/27/2017   Procedure: 1st Ray Amputation Right Foot;  Surgeon: Nadara Mustard, MD;  Location: [facility] OR;  Service: Orthopedics;  Laterality: Right;  . APPENDECTOMY    . CARDIAC CATHETERIZATION    . CORONARY STENT INTERVENTION  2005  . Great toe amputation right.    . I&D EXTREMITY Left 12/30/2015   Procedure: IRRIGATION AND DEBRIDEMENT LEFT FOOT, TRANSMETATARSAL AMPUTATION WITH APPLICATION OF ANTIBIOTIC BEADS AND WOUND VAC;  Surgeon: Nadara Mustard, MD;  Location: [facility] OR;  Service: Orthopedics;  Laterality: Left;  . TONSILLECTOMY     Social History   Occupational History  . Not on file.   Social History Main Topics  . Smoking status: Current Every Day Smoker    Packs/day: 2.00    Years: 35.00    Types: Cigarettes  . Smokeless tobacco: Never Used  . Alcohol use Yes     Comment: beers occasionally  . Drug use: No  . Sexual activity: Not on file

## 2017-06-12 ENCOUNTER — Ambulatory Visit (INDEPENDENT_AMBULATORY_CARE_PROVIDER_SITE_OTHER): Payer: Medicaid Other | Admitting: Family

## 2017-06-17 ENCOUNTER — Ambulatory Visit (INDEPENDENT_AMBULATORY_CARE_PROVIDER_SITE_OTHER): Payer: Medicaid Other | Admitting: Family

## 2017-10-30 ENCOUNTER — Telehealth (INDEPENDENT_AMBULATORY_CARE_PROVIDER_SITE_OTHER): Payer: Self-pay | Admitting: Radiology

## 2017-10-30 MED ORDER — DOXYCYCLINE HYCLATE 100 MG PO TABS: 100.0000 mg | ORAL_TABLET | Freq: Two times a day (BID) | ORAL | 0 refills | Status: DC

## 2017-10-30 NOTE — Telephone Encounter (Signed)
I called Mauricio Poegina York NP, to advise MRI results left foot osteomyelitis. Needs to come in office next week to see Dr. Lajoyce Cornersuda for surgical intervention. Sent in doxycycline to [facility]. Left vm for patient to call our office to schedule and advise doxycycline sent to [facility].

## 2017-11-08 ENCOUNTER — Telehealth (INDEPENDENT_AMBULATORY_CARE_PROVIDER_SITE_OTHER): Payer: Self-pay | Admitting: Radiology

## 2017-11-08 NOTE — Telephone Encounter (Signed)
Left additional voicemail for patient to call and schedule appointment with Dr. Lajoyce Cornersuda. His pcp reached out and sent us his foot MRI. Advised he needs to come in the office, order for doxycycline was already sent last week. Also mailing letter to patients home since he is unreachable by phone.

## 2018-04-29 ENCOUNTER — Emergency Department (HOSPITAL_COMMUNITY)
Admission: EM | Admit: 2018-04-29 | Discharge: 2018-04-29 | Disposition: A | Discharge status: Home / Self Care | Payer: Medicaid Other | Attending: Emergency Medicine | Admitting: Emergency Medicine

## 2018-04-29 ENCOUNTER — Encounter (HOSPITAL_COMMUNITY): Payer: Self-pay | Admitting: Emergency Medicine

## 2018-04-29 ENCOUNTER — Emergency Department (HOSPITAL_COMMUNITY): Payer: Medicaid Other

## 2018-04-29 DIAGNOSIS — F17210 Nicotine dependence, cigarettes, uncomplicated: Secondary | ICD-10-CM | POA: Insufficient documentation

## 2018-04-29 DIAGNOSIS — Z79899 Other long term (current) drug therapy: Secondary | ICD-10-CM | POA: Insufficient documentation

## 2018-04-29 DIAGNOSIS — I10 Essential (primary) hypertension: Secondary | ICD-10-CM | POA: Insufficient documentation

## 2018-04-29 DIAGNOSIS — Z955 Presence of coronary angioplasty implant and graft: Secondary | ICD-10-CM | POA: Diagnosis not present

## 2018-04-29 DIAGNOSIS — E119 Type 2 diabetes mellitus without complications: Secondary | ICD-10-CM | POA: Diagnosis not present

## 2018-04-29 DIAGNOSIS — Z794 Long term (current) use of insulin: Secondary | ICD-10-CM | POA: Diagnosis not present

## 2018-04-29 DIAGNOSIS — I2510 Atherosclerotic heart disease of native coronary artery without angina pectoris: Secondary | ICD-10-CM | POA: Diagnosis not present

## 2018-04-29 DIAGNOSIS — R0789 Other chest pain: Secondary | ICD-10-CM | POA: Insufficient documentation

## 2018-04-29 LAB — HEPATIC FUNCTION PANEL
ALBUMIN: 3.5 g/dL (ref 3.5–5.0)
ALT: 18 U/L (ref 17–63)
AST: 21 U/L (ref 15–41)
Alkaline Phosphatase: 76 U/L (ref 38–126)
BILIRUBIN DIRECT: 0.2 mg/dL (ref 0.1–0.5)
Indirect Bilirubin: 0.3 mg/dL (ref 0.3–0.9)
Total Bilirubin: 0.5 mg/dL (ref 0.3–1.2)
Total Protein: 6.5 g/dL (ref 6.5–8.1)

## 2018-04-29 LAB — I-STAT TROPONIN, ED
Troponin i, poc: 0 ng/mL (ref 0.00–0.08)
Troponin i, poc: 0.02 ng/mL (ref 0.00–0.08)
Troponin i, poc: 0 ng/mL (ref 0.00–0.08)

## 2018-04-29 LAB — BASIC METABOLIC PANEL
Anion gap: 8 (ref 5–15)
BUN: 18 mg/dL (ref 6–20)
CO2: 25 mmol/L (ref 22–32)
Calcium: 9.1 mg/dL (ref 8.9–10.3)
Chloride: 103 mmol/L (ref 101–111)
Creatinine, Ser: 0.66 mg/dL (ref 0.61–1.24)
GFR calc Af Amer: >60 mL/min (ref >60)
GLUCOSE: 198 mg/dL — AB (ref 65–99)
POTASSIUM: 4.2 mmol/L (ref 3.5–5.1)
Sodium: 136 mmol/L (ref 135–145)

## 2018-04-29 LAB — CBC
HEMATOCRIT: 47.2 % (ref 39.0–52.0)
HEMOGLOBIN: 15.5 g/dL (ref 13.0–17.0)
MCH: 28 pg (ref 26.0–34.0)
MCHC: 32.8 g/dL (ref 30.0–36.0)
MCV: 85.4 fL (ref 78.0–100.0)
Platelets: 189 10*3/uL (ref 150–400)
RBC: 5.53 MIL/uL (ref 4.22–5.81)
RDW: 14.7 % (ref 11.5–15.5)
WBC: 8.4 10*3/uL (ref 4.0–10.5)

## 2018-04-29 LAB — D-DIMER, QUANTITATIVE (NOT AT ARMC): D DIMER QUANT: 0.66 ug{FEU}/mL — AB (ref 0.00–0.50)

## 2018-04-29 MED ORDER — TECHNETIUM TC 99M DIETHYLENETRIAME-PENTAACETIC ACID
30.0000 | Freq: Once | INTRAVENOUS | Status: AC | PRN
  Administered 2018-04-29: 30 via INTRAVENOUS

## 2018-04-29 MED ORDER — ASPIRIN 81 MG PO CHEW
324.0000 mg | CHEWABLE_TABLET | Freq: Once | ORAL | Status: AC
  Administered 2018-04-29: 324 mg via ORAL
  Filled 2018-04-29: qty 4

## 2018-04-29 MED ORDER — TECHNETIUM TO 99M ALBUMIN AGGREGATED
4.0000 | Freq: Once | INTRAVENOUS | Status: AC | PRN
  Administered 2018-04-29: 4 via INTRAVENOUS

## 2018-04-29 NOTE — Discharge Instructions (Addendum)
You can take Tylenol or Ibuprofen as directed for pain. You can alternate Tylenol and Ibuprofen every 4 hours. If you take Tylenol at 1pm, then you can take Ibuprofen at 5pm. Then you can take Tylenol again at 9pm.   Low up with referred cardiologist.  Call their office to arrange for an appointment.  Follow-up with your primary care doctor the next 2 to 4 days for further evaluation.  Return to the Emergency Department immediately if you experiencing worsening chest pain, difficulty breathing, nausea/vomiting, get very sweaty, headache or any other worsening or concerning symptoms.

## 2018-04-29 NOTE — ED Triage Notes (Signed)
Pt states he has been having CP since Friday. Has a stent and states this feels like he has another blockage. Sharp in nature- radiates into back. Denies n/v.

## 2018-04-29 NOTE — ED Notes (Signed)
Patient transported to Nuclear Medicine for VQ scan.

## 2018-04-29 NOTE — ED Provider Notes (Signed)
[facility] EMERGENCY DEPARTMENT Provider Note   CSN: [id number] Arrival date & time: 04/29/18  0747     History   Chief Complaint Chief Complaint  Patient presents with  . Chest Pain    HPI Barnes Florek. is a 62 y.o. male past medical history of CAD, DM, hyperlipidemia, hypertension who presents for evaluation of chest pain that has been ongoing for the last 4 days.  Patient reports that pain is intermittent.  He states that when pain first began, he was having pain "all in my gut that went up to my chest."  He states that abdominal pain has improved but he is continued to have right-sided chest pain.  He states that the pain is intermittent but does not know of any specific actions that brings on the pain.  He states that it is worse with deep inspiration.  He he states that he does not exert himself and gets around using a wheelchair secondary to previous left lower extremity amputation so he does not know if his chest pain is worse with exertion.  He has had no associated nausea or diaphoresis.  He denies any difficulty breathing.  Patient has been eating and drinking without any difficulty.  Patient denies any preceding trauma, injury, fall.  Patient denies any fevers.  Patient reports that he does smoke 1 to 2 packs of cigarettes a day.  Denies any cocaine use.  Patient reports a history of catheterization with a stent placement.  He states that he has history of high blood pressure and diabetes.  The history is provided by the patient.    Past Medical History:  Diagnosis Date  . Coronary artery disease   . Diabetes mellitus without complication (HCC)   . Hyperlipidemia   . Hypertension   . Osteomyelitis (HCC) 03/25/2017   RT FOOT    Patient Active Problem List   Diagnosis Date Noted  . 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 Amputated great toe of right foot (HCC) 04/15/2017  . Ulcer of toe of left foot, limited to  breakdown of skin (HCC) 04/04/2017  . 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 Edema     Past Surgical History:  Procedure Laterality Date  . AMPUTATION Right 03/27/2017   Procedure: 1st Ray Amputation Right Foot;  Surgeon: Nadara Mustard, MD;  Location: [facility] OR;  Service: Orthopedics;  Laterality: Right;  . APPENDECTOMY    . CARDIAC CATHETERIZATION    . CORONARY STENT INTERVENTION  2005  . Great toe amputation right.    . I&D EXTREMITY Left 12/30/2015   Procedure: IRRIGATION AND DEBRIDEMENT LEFT FOOT, TRANSMETATARSAL AMPUTATION WITH APPLICATION OF ANTIBIOTIC BEADS AND WOUND VAC;  Surgeon: Nadara Mustard, MD;  Location: [facility] OR;  Service: Orthopedics;  Laterality: Left;  . TONSILLECTOMY          Home Medications    Prior to Admission medications   Medication Sig Start Date End Date Taking? Authorizing Provider  atorvastatin (LIPITOR) 40 MG tablet Take 1 tablet (40 mg total) by mouth daily at 6 PM. 03/28/17  Yes Howard Pouch, MD  glipiZIDE (GLUCOTROL) 10 MG tablet Take 10 mg by mouth 2 (two) times daily before a meal.   Yes [provider]  Insulin Degludec (TRESIBA) 100 UNIT/ML SOLN Inject 70 Units into the skin daily.   Yes [provider]  lisinopril (PRINIVIL,ZESTRIL) 5  MG tablet Take 5 mg by mouth 2 (two) times daily.   Yes [provider]  metoprolol tartrate (LOPRESSOR) 25 MG tablet Take 25 mg by mouth 2 (two) times daily.   Yes [provider]  pregabalin (LYRICA) 150 MG capsule Take 150 mg by mouth 3 (three) times daily.   Yes [provider]  tapentadol HCl (NUCYNTA) 75 MG tablet Take 75 mg by mouth every 6 (six) hours as needed for severe pain.   Yes [provider]  insulin glargine (LANTUS) 100 UNIT/ML injection Inject 0.05 mLs (5 Units total) into the skin at bedtime. Patient not taking: Reported on 04/29/2018 03/28/17   Howard Pouch, MD    Family  History Family History  Problem Relation Age of Onset  . Diabetes Mother   . Emphysema Father     Social History Social History   Tobacco Use  . Smoking status: Current Every Day Smoker    Packs/day: 2.00    Years: 35.00    Pack years: 70.00    Types: Cigarettes  . Smokeless tobacco: Never Used  Substance Use Topics  . Alcohol use: Yes    Comment: beers occasionally  . Drug use: No     Allergies   Iodinated diagnostic agents and Penicillins   Review of Systems Review of Systems  Constitutional: Negative for chills and fever.  Respiratory: Negative for cough and shortness of breath.   Cardiovascular: Positive for chest pain.  Gastrointestinal: Negative for abdominal pain, diarrhea, nausea and vomiting.  Genitourinary: Negative for dysuria and hematuria.  Musculoskeletal: Negative for back pain.  Skin: Negative for rash.  Neurological: Negative for dizziness, weakness, numbness and headaches.  All other systems reviewed and are negative.    Physical Exam Updated Vital Signs BP 113/66   Pulse 70   Temp 97.8 F (36.6 C) (Oral)   Resp 15   Ht 6' (1.829 m)   Wt 90.7 kg (200 lb)   SpO2 92%   BMI 27.12 kg/m   Physical Exam  Constitutional: He is oriented to person, place, and time. He appears well-developed and well-nourished.  HENT:  Head: Normocephalic and atraumatic.  Mouth/Throat: Oropharynx is clear and moist and mucous membranes are normal.  Eyes: Pupils are equal, round, and reactive to light. Conjunctivae, EOM and lids are normal.  Neck: Full passive range of motion without pain.  Cardiovascular: Normal rate, regular rhythm, normal heart sounds and normal pulses. Exam reveals no gallop and no friction rub.  No murmur heard. Pulses:      Radial pulses are 2+ on the right side, and 2+ on the left side.       Dorsalis pedis pulses are 2+ on the right side.  Unable to assess left DP pulse as patient has a left lower extremity amputation.    Pulmonary/Chest: Effort normal and breath sounds normal.  Lungs clear to auscultation bilaterally.  Symmetric chest rise.  No wheezing, rales, rhonchi.  Abdominal: Soft. Normal appearance and bowel sounds are normal. There is tenderness in the right upper quadrant. There is no rigidity and no guarding.  Musculoskeletal: Normal range of motion.  Left BKA  Neurological: He is alert and oriented to person, place, and time.  Skin: Skin is warm and dry. Capillary refill takes less than 2 seconds.  Psychiatric: He has a normal mood and affect. His speech is normal.  Nursing note and vitals reviewed.    ED Treatments / Results  Labs (all labs ordered are listed, but only  abnormal results are displayed) Labs Reviewed  BASIC METABOLIC PANEL - Abnormal; Notable for the following components:      Result Value   Glucose, Bld 198 (*)    All other components within normal limits  D-DIMER, QUANTITATIVE (NOT AT [facility]) - Abnormal; Notable for the following components:   D-Dimer, Quant 0.66 (*)    All other components within normal limits  CBC  HEPATIC FUNCTION PANEL  I-STAT TROPONIN, ED  I-STAT TROPONIN, ED  I-STAT TROPONIN, ED    EKG EKG Interpretation  Date/Time:  Tuesday April 29 2018 11:09:32 EDT Ventricular Rate:  72 PR Interval:  170 QRS Duration: 127 QT Interval:  397 QTC Calculation: 435 R Axis:   -85 Text Interpretation:  Sinus rhythm Nonspecific IVCD with LAD Inferolateral infarct, age indeterminate Confirmed by Tilden Fossaees, Elizabeth [phone]([id number]) on 04/29/2018 11:19:17 AM   Radiology Dg Chest 2 View  Result Date: 04/29/2018 CLINICAL DATA:  Midline to right sided lower chest discomfort for the past 4 days. Current smoker. History of diabetes. EXAM: CHEST - 2 VIEW COMPARISON:  PA and lateral chest x-ray of June 07, 2016 FINDINGS: The lungs are adequately inflated. The interstitial markings are coarse though stable. There is no alveolar infiltrate or pleural effusion. The heart and pulmonary  vascularity are normal. The mediastinum is normal in width. There is calcification in the wall of the aortic arch. The bony thorax exhibits no acute abnormality. IMPRESSION: Mild chronic interstitial changes likely reflect the patient's smoking history. No acute cardiopulmonary abnormality. Thoracic aortic atherosclerosis. Electronically Signed   By: David  SwazilandJordan M.D.   On: 04/29/2018 08:38   Nm Pulmonary Vent And Perf (v/q Scan)  Result Date: 04/29/2018 CLINICAL DATA:  Chest pain and positive D-dimer examination EXAM: NUCLEAR MEDICINE VENTILATION - PERFUSION LUNG SCAN VIEWS: Anterior, posterior, left lateral, right lateral, RPO, LPO, RAO, LAO-ventilation and perfusion RADIOPHARMACEUTICALS:  32.7 mCi of Tc-2764m DTPA aerosol inhalation and 4.1 mCi Tc4664m-MAA IV COMPARISON:  Chest radiographs April 29, 2018 FINDINGS: Ventilation: Radiotracer uptake on the ventilation study is homogeneous and symmetric bilaterally without focal ventilation defect evident. Perfusion: Radiotracer uptake on the perfusion study is homogeneous and symmetric bilaterally. No perfusion defects are demonstrable. IMPRESSION: No appreciable ventilation or perfusion defects. This study constitutes an overall very low probability of pulmonary embolus. Electronically Signed   By: Bretta BangWilliam  Woodruff III M.D.   On: 04/29/2018 14:04    Procedures Procedures (including critical care time)  Medications Ordered in ED Medications  aspirin chewable tablet 324 mg (324 mg Oral Given 04/29/18 0937)  technetium TC 37M diethylenetriame-pentaacetic acid (DTPA) injection 30 millicurie (30 millicuries Intravenous Given 04/29/18 1304)  technetium albumin aggregated (MAA) injection solution 4 millicurie (4 millicuries Intravenous Contrast Given 04/29/18 1305)     Initial Impression / Assessment and Plan / ED Course  I have reviewed the triage vital signs and the nursing notes.  Pertinent labs & imaging results that were available during my care of the  patient were reviewed by me and considered in my medical decision making (see chart for details).     62 year old male who presents for evaluation of 4 days of intermittent right-sided chest pain.  Reports that it began as diffuse abdominal pain and then began having some intermittent right-sided chest pain.  Reports abdominal pain has improved.  No fevers, difficulty breathing.  Chest pain worse with deep inspiration.  Patient does not exert himself he does not recall if the pain is worse with exertion. Patient is afebril, non-toxic appearing, sitting  comfortably on examination table. Vital signs reviewed and stable.  On exam, pain is reproduced with palpation of the right anterior chest wall.  No deformities or crepitus noted.  Lungs clear to auscultation bilaterally.  Patient does exhibit some right upper quadrant tenderness.  Consider ACS etiology versus infectious etiology.  Also consider hepatobiliary etiology given right upper quadrant tenderness.  Initial labs ordered at triage.   Initial troponin negative.  CBC without any significant leukocytosis or anemia.  BMP shows hyperglycemia.  Otherwise unremarkable.  We will add LFTs.  Chest x-ray negative for any acute infectious etiology.  Patient has a heart score of 4.  We will plan for delta troponin.  Review of records show that patient did have recent amputation of left lower extremity in March 2019.  He is still underneath the 83-month window of surgery.  Given concern, will plan for d-dimer for evaluation of PE.  D-dimer is elevated.  Will plan for VQ scan as patient is allergic to iodine.  Delta troponin is 0.02 which is slightly elevated from previous.  Will plan for third troponin. LFTs within normal limits.   VQ scan shows no evidence of ventilation or perfusion defects that would indicate acute pulmonary embolism.  Third troponin is negative at 0.00.  At this time, given reassuring work-up, 3 serial negative troponins, negative VQ scan,  do not suspect ACS etiology or PE as the cause of patient's pain.    At this time, patient states he is not having any pain.  Vital signs are stable.  Patient has very atypical presentation of chest pain.  Do not suspect that this is cardiac in nature.  I do feel that patient does will need to follow-up with outpatient cardiology as he has not seen his cardiologist in several years.  Patient was originally going to wait for if it does not wish to go there anymore.  Outpatient cardiology referral for further evaluation.  Patient had ample opportunity for questions and discussion. All patient's questions were answered with full understanding. Strict return precautions discussed. Patient expresses understanding and agreement to plan.     Final Clinical Impressions(s) / ED Diagnoses   Final diagnoses:  Atypical chest pain    ED Discharge Orders    None       Rosana Hoes 04/29/18 2145    Tilden Fossa, MD 05/02/18 [phone]

## 2018-05-01 NOTE — Progress Notes (Signed)
Cardiology Office Note   Date:  05/02/2018   ID:  Antonio [name]., DOB 1956/05/08, MRN [id number]  PCP:  Dema Severin, NP  Cardiologist:   No primary care provider on file. Referring:  ED  Chief Complaint  Patient presents with  . Chest Pain      History of Present Illness: Antonio [name]. is a 62 y.o. male who presents for evaluation of chest pain.  He was in the ED yesterday for chest pain.  I reviewed these records for this visit.    He was was thought to be low cardiac risk.  He had an elevated D Dimer and had a negative VQ.    The patient has a past cardiac history of having a stent laced in the 1990s at another hospital.  He says he had a stress test about 6 years ago at another facility.  He has peripheral vascular disease and he has had stenting and amputations.  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  He developed chest discomfort earlier this week.  It happened then persisted overnight.  He finally went to the emergency room the next morning and was driven there.  The pain went away slowly over time.  He was treated with aspirin.  He did not get any nitroglycerin.  He said the pain was a lower right discomfort.  It was somewhat sharp.  It was 7 out of 10.  He did not describe radiation to his jaw or to his arms.  There is no associated nausea vomiting diaphoresis.  It was not like previous indigestion and felt more like his previous heart pain.  Is not had this before or since.  He gets around in his wheelchair.   Past Medical History:  Diagnosis Date  . Coronary artery disease   . Diabetes mellitus without complication (HCC)   . Hyperlipidemia   . Hypertension   . Osteomyelitis (HCC) 03/25/2017   RT FOOT    Past Surgical History:  Procedure Laterality Date  . AMPUTATION Right 03/27/2017   Procedure: 1st Ray Amputation Right Foot;  Surgeon: Nadara Mustard, MD;  Location: [facility] OR;  Service: Orthopedics;  Laterality: Right;  .  APPENDECTOMY    . CARDIAC CATHETERIZATION    . CORONARY STENT INTERVENTION  2005  . Great toe amputation right.    . I&D EXTREMITY Left 12/30/2015   Procedure: IRRIGATION AND DEBRIDEMENT LEFT FOOT, TRANSMETATARSAL AMPUTATION WITH APPLICATION OF ANTIBIOTIC BEADS AND WOUND VAC;  Surgeon: Nadara Mustard, MD;  Location: [facility] OR;  Service: Orthopedics;  Laterality: Left;  . TONSILLECTOMY       Current Outpatient Medications  Medication Sig Dispense Refill  . atorvastatin (LIPITOR) 40 MG tablet Take 1 tablet (40 mg total) by mouth daily at 6 PM. 30 tablet 0  . glipiZIDE (GLUCOTROL) 10 MG tablet Take 10 mg by mouth 2 (two) times daily before a meal.    . Insulin Degludec (TRESIBA) 100 UNIT/ML SOLN Inject 70 Units into the skin daily.    . insulin glargine (LANTUS) 100 UNIT/ML injection Inject 0.05 mLs (5 Units total) into the skin at bedtime. 10 mL 11  . lisinopril (PRINIVIL,ZESTRIL) 5 MG tablet Take 5 mg by mouth 2 (two) times daily.    . metoprolol tartrate (LOPRESSOR) 25 MG tablet Take 25 mg by mouth 2 (two) times daily.    . pregabalin (LYRICA) 150 MG capsule Take 150 mg by mouth 3 (  three) times daily.    . tapentadol HCl (NUCYNTA) 75 MG tablet Take 75 mg by mouth every 6 (six) hours as needed for severe pain.     No current facility-administered medications for this visit.     Allergies:   Iodinated diagnostic agents and Penicillins    Social History:  The patient  reports that he has been smoking cigarettes.  He has a 70.00 pack-year smoking history. He has never used smokeless tobacco. He reports that he drinks alcohol. He reports that he does not use drugs.   Family History:  The patient's family history includes CAD (age of onset: 43) in his sister; Diabetes in his mother; Emphysema in his father.    ROS:  Please see the history of present illness.   Otherwise, review of systems are positive for none.   All other systems are reviewed and negative.    PHYSICAL EXAM: VS:  BP 122/74    Pulse 72  , BMI There is no height or weight on file to calculate BMI. GEN:  No distress NECK:  No jugular venous distention at 90 degrees, waveform within normal limits, carotid upstroke brisk and symmetric, no bruits, no thyromegaly LYMPHATICS:  No cervical adenopathy LUNGS:  Clear to auscultation bilaterally BACK:  No CVA tenderness CHEST:  Unremarkable HEART:  S1 and S2 within normal limits, no S3, no S4, no clicks, no rubs, no murmurs ABD:  Positive bowel sounds normal in frequency in pitch, no bruits, no rebound, no guarding, unable to assess midline mass or bruit with the patient seated. EXT:  2 plus pulses throughout, moderate edema, no cyanosis no clubbing, status post left BKA and right great toe amputatoin, decreased DP/PT on the right.   SKIN:  No rashes no nodules NEURO:  Cranial nerves II through XII grossly intact, motor grossly intact throughout PSYCH:  Cognitively intact, oriented to person place and time    EKG:  EKG is not ordered today. The ekg ordered 72, axis within normal limits, intervals6/4/19 demonstrates sinus rhythm, rate within normal limits, early transition lead V2, no acute ST-T wave changes.   Recent Labs: 04/29/2018: ALT 18; BUN 18; Creatinine, Ser 0.66; Hemoglobin 15.5; Platelets 189; Potassium 4.2; Sodium 136    Lipid Panel No results found for: CHOL, TRIG, HDL, CHOLHDL, VLDL, LDLCALC, LDLDIRECT    Wt Readings from Last 3 Encounters:  04/29/18 200 lb (90.7 kg)  05/22/17 230 lb (104.3 kg)  05/08/17 230 lb (104.3 kg)      Other studies Reviewed: Additional studies/ records that were reviewed today include: ED records. Review of the above records demonstrates:  Please see elsewhere in the note.     ASSESSMENT AND PLAN:  CHEST PAIN: The patient's chest pain was somewhat atypical and there was no objective evidence of ischemia.  However, he has significant cardiovascular risk factors.  He needs screening with a stress test but obviously would not  be able to walk on a treadmill.  Therefore, he will have a [employer].  RISK REDUCTION:  He needs a lipid profile.  I will check this when he comes back.  DIABETES:  He has had adjustments to his meds.  I will defer management to Dema Severin, NP   TOBACCO:  He is trying to cut down.  He is not yet ready to quit.   Current medicines are reviewed at length with the patient today.  The patient does not have concerns regarding medicines.  The following changes have been made:  no change  Labs/ tests ordered today include:   Orders Placed This Encounter  Procedures  . Lipid panel  . MYOCARDIAL PERFUSION IMAGING     Disposition:   FU with APP in 3 months or sooner if symptoms worsen.      Signed, Rollene RotundaJames Quin Mcpherson, MD  05/02/2018 10:45 AM    [facility]

## 2018-05-02 ENCOUNTER — Encounter: Payer: Self-pay | Admitting: Cardiology

## 2018-05-02 ENCOUNTER — Ambulatory Visit (INDEPENDENT_AMBULATORY_CARE_PROVIDER_SITE_OTHER): Payer: Medicaid Other | Admitting: Cardiology

## 2018-05-02 VITALS — BP 122/74 | HR 72

## 2018-05-02 DIAGNOSIS — R079 Chest pain, unspecified: Secondary | ICD-10-CM | POA: Diagnosis not present

## 2018-05-02 DIAGNOSIS — Z720 Tobacco use: Secondary | ICD-10-CM | POA: Diagnosis not present

## 2018-05-02 DIAGNOSIS — E785 Hyperlipidemia, unspecified: Secondary | ICD-10-CM | POA: Diagnosis not present

## 2018-05-02 DIAGNOSIS — I25119 Atherosclerotic heart disease of native coronary artery with unspecified angina pectoris: Secondary | ICD-10-CM | POA: Diagnosis not present

## 2018-05-02 NOTE — Patient Instructions (Signed)
Medication Instructions:  Continue current medications  If you need a refill on your cardiac medications before your next appointment, please call your pharmacy.  Labwork: Fasting Lipids HERE IN OUR OFFICE AT [facility]  Take the provided lab slips with you to the lab for your blood draw.   You will need to fast. DO NOT EAT OR DRINK PAST MIDNIGHT.   Testing/Procedures: Your physician has requested that you have a lexiscan myoview. For further information please visit [email or web address]. Please follow instruction sheet, as given.   Follow-Up: Your physician wants you to follow-up in: 4 Months with APP.      Thank you for choosing [facility]!!

## 2018-05-08 ENCOUNTER — Telehealth (HOSPITAL_COMMUNITY): Payer: Self-pay

## 2018-05-08 NOTE — Telephone Encounter (Signed)
Encounter complete. 

## 2018-05-13 ENCOUNTER — Ambulatory Visit (HOSPITAL_COMMUNITY)
Admission: RE | Admit: 2018-05-13 | Discharge: 2018-05-13 | Disposition: A | Discharge status: Home / Self Care | Payer: Medicaid Other | Source: Ambulatory Visit | Attending: Cardiovascular Disease | Admitting: Cardiovascular Disease

## 2018-05-13 DIAGNOSIS — R079 Chest pain, unspecified: Secondary | ICD-10-CM

## 2018-05-13 LAB — MYOCARDIAL PERFUSION IMAGING
CHL CUP NUCLEAR SDS: 3
CHL CUP NUCLEAR SRS: 11
CHL CUP NUCLEAR SSS: 14
LV dias vol: 137 mL (ref 62–150)
LVSYSVOL: 82 mL
NUC STRESS TID: 1.04
Peak HR: 82 {beats}/min
Rest HR: 66 {beats}/min

## 2018-05-13 MED ORDER — TECHNETIUM TC 99M TETROFOSMIN IV KIT
9.6000 | PACK | Freq: Once | INTRAVENOUS | Status: AC | PRN
  Administered 2018-05-13: 9.6 via INTRAVENOUS
  Filled 2018-05-13: qty 10

## 2018-05-13 MED ORDER — TECHNETIUM TC 99M TETROFOSMIN IV KIT
31.2000 | PACK | Freq: Once | INTRAVENOUS | Status: AC | PRN
  Administered 2018-05-13: 31.2 via INTRAVENOUS
  Filled 2018-05-13: qty 32

## 2018-05-13 MED ORDER — REGADENOSON 0.4 MG/5ML IV SOLN
0.4000 mg | Freq: Once | INTRAVENOUS | Status: AC
  Administered 2018-05-13: 0.4 mg via INTRAVENOUS

## 2018-05-27 ENCOUNTER — Telehealth: Payer: Self-pay | Admitting: *Deleted

## 2018-05-27 NOTE — Telephone Encounter (Signed)
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  I don't have his old report to compare.  EF was said to be mildly low.  Please schedule an echo and then follow up with me to discuss.  Call Mr. Antonio [name] with the results and send results to [name], [name], NP

## 2018-05-27 NOTE — Telephone Encounter (Signed)
Spoke with pt about his stress test, pt refuse to schedule an Echo and do not want to schedule f/u appt with Dr Antoine PocheHochrein at the moment, advised pt I will let Dr Antoine PocheHochrein know about his decision.

## 2018-07-11 ENCOUNTER — Encounter (HOSPITAL_COMMUNITY): Payer: Self-pay

## 2018-07-11 ENCOUNTER — Emergency Department (HOSPITAL_COMMUNITY)
Admission: EM | Admit: 2018-07-11 | Discharge: 2018-07-11 | Disposition: A | Discharge status: Home / Self Care | Payer: Medicaid Other | Attending: Emergency Medicine | Admitting: Emergency Medicine

## 2018-07-11 ENCOUNTER — Emergency Department (HOSPITAL_COMMUNITY): Payer: Medicaid Other

## 2018-07-11 ENCOUNTER — Other Ambulatory Visit: Payer: Self-pay

## 2018-07-11 DIAGNOSIS — Z794 Long term (current) use of insulin: Secondary | ICD-10-CM | POA: Diagnosis not present

## 2018-07-11 DIAGNOSIS — S90821A Blister (nonthermal), right foot, initial encounter: Secondary | ICD-10-CM | POA: Insufficient documentation

## 2018-07-11 DIAGNOSIS — S99921A Unspecified injury of right foot, initial encounter: Secondary | ICD-10-CM | POA: Diagnosis present

## 2018-07-11 DIAGNOSIS — X58XXXA Exposure to other specified factors, initial encounter: Secondary | ICD-10-CM | POA: Diagnosis not present

## 2018-07-11 DIAGNOSIS — Y929 Unspecified place or not applicable: Secondary | ICD-10-CM | POA: Insufficient documentation

## 2018-07-11 DIAGNOSIS — Z7982 Long term (current) use of aspirin: Secondary | ICD-10-CM | POA: Diagnosis not present

## 2018-07-11 DIAGNOSIS — F17210 Nicotine dependence, cigarettes, uncomplicated: Secondary | ICD-10-CM | POA: Insufficient documentation

## 2018-07-11 DIAGNOSIS — E119 Type 2 diabetes mellitus without complications: Secondary | ICD-10-CM | POA: Diagnosis not present

## 2018-07-11 DIAGNOSIS — Y939 Activity, unspecified: Secondary | ICD-10-CM | POA: Diagnosis not present

## 2018-07-11 DIAGNOSIS — I2510 Atherosclerotic heart disease of native coronary artery without angina pectoris: Secondary | ICD-10-CM | POA: Insufficient documentation

## 2018-07-11 DIAGNOSIS — Z79899 Other long term (current) drug therapy: Secondary | ICD-10-CM | POA: Insufficient documentation

## 2018-07-11 DIAGNOSIS — Y999 Unspecified external cause status: Secondary | ICD-10-CM | POA: Diagnosis not present

## 2018-07-11 LAB — BASIC METABOLIC PANEL
Anion gap: 9 (ref 5–15)
BUN: 21 mg/dL — AB (ref 6–20)
CALCIUM: 9.3 mg/dL (ref 8.9–10.3)
CO2: 25 mmol/L (ref 22–32)
CREATININE: 0.78 mg/dL (ref 0.61–1.24)
Chloride: 100 mmol/L (ref 98–111)
GFR calc Af Amer: >60 mL/min (ref >60)
GLUCOSE: 345 mg/dL — AB (ref 70–99)
Potassium: 4.5 mmol/L (ref 3.5–5.1)
SODIUM: 134 mmol/L — AB (ref 135–145)

## 2018-07-11 LAB — CBC WITH DIFFERENTIAL/PLATELET
Abs Immature Granulocytes: 0 10*3/uL (ref 0.0–0.1)
BASOS PCT: 1 %
Basophils Absolute: 0.1 10*3/uL (ref 0.0–0.1)
EOS ABS: 0.2 10*3/uL (ref 0.0–0.7)
Eosinophils Relative: 3 %
HCT: 49.6 % (ref 39.0–52.0)
Hemoglobin: 16.6 g/dL (ref 13.0–17.0)
Immature Granulocytes: 1 %
Lymphocytes Relative: 30 %
Lymphs Abs: 2.5 10*3/uL (ref 0.7–4.0)
MCH: 29.6 pg (ref 26.0–34.0)
MCHC: 33.5 g/dL (ref 30.0–36.0)
MCV: 88.4 fL (ref 78.0–100.0)
MONO ABS: 0.6 10*3/uL (ref 0.1–1.0)
MONOS PCT: 7 %
NEUTROS PCT: 60 %
Neutro Abs: 5 10*3/uL (ref 1.7–7.7)
PLATELETS: 179 10*3/uL (ref 150–400)
RBC: 5.61 MIL/uL (ref 4.22–5.81)
RDW: 13.3 % (ref 11.5–15.5)
WBC: 8.4 10*3/uL (ref 4.0–10.5)

## 2018-07-11 LAB — CBG MONITORING, ED
GLUCOSE-CAPILLARY: 294 mg/dL — AB (ref 70–99)
Glucose-Capillary: 155 mg/dL — ABNORMAL HIGH (ref 70–99)

## 2018-07-11 MED ORDER — SODIUM CHLORIDE 0.9 % IV BOLUS
500.0000 mL | Freq: Once | INTRAVENOUS | Status: AC
  Administered 2018-07-11: 500 mL via INTRAVENOUS

## 2018-07-11 MED ORDER — DOXYCYCLINE HYCLATE 100 MG PO CAPS: 100.0000 mg | ORAL_CAPSULE | Freq: Two times a day (BID) | ORAL | 0 refills | Status: AC

## 2018-07-11 MED ORDER — CEPHALEXIN 500 MG PO CAPS: 500.0000 mg | ORAL_CAPSULE | Freq: Four times a day (QID) | ORAL | 0 refills | Status: DC

## 2018-07-11 MED ORDER — CEPHALEXIN 500 MG PO CAPS: 500.0000 mg | ORAL_CAPSULE | Freq: Four times a day (QID) | ORAL | 0 refills | Status: AC

## 2018-07-11 MED ORDER — INSULIN ASPART 100 UNIT/ML ~~LOC~~ SOLN
8.0000 [IU] | Freq: Once | SUBCUTANEOUS | Status: AC
  Administered 2018-07-11: 8 [IU] via INTRAVENOUS
  Filled 2018-07-11: qty 1

## 2018-07-11 MED ORDER — DOXYCYCLINE HYCLATE 100 MG PO CAPS: 100.0000 mg | ORAL_CAPSULE | Freq: Two times a day (BID) | ORAL | 0 refills | Status: DC

## 2018-07-11 NOTE — ED Notes (Signed)
Patient refusing to wear gown, blood pressure cuff, and pulse ox.

## 2018-07-11 NOTE — ED Triage Notes (Signed)
Pt c.o blister on the bottom of his right foot, states he noticed it yesterday. Pt a L BKA and is missing his great toe on his right foot. No fevers, chills. No redness around blister.

## 2018-07-11 NOTE — ED Notes (Signed)
Patient transported to X-ray 

## 2018-07-11 NOTE — ED Provider Notes (Signed)
[facility] EMERGENCY DEPARTMENT Provider Note   CSN: [id number] Arrival date & time: 07/11/18  0909     History   Chief Complaint Chief Complaint  Patient presents with  . Blister    HPI Antonio [name]. is a 62 y.o. male with history of diabetes, osteomyelitis and left leg amputation presenting for blister of the right heel.  Patient states that he first noticed the blister last night, the blister is closed and painful to the touch, 5/10 in severity and worse with walking.  Patient states that he is concerned because he has had similar blisters in the past that have led to multiple amputations including an osteomyelitis of his right great toe.  Patient denies history of fevers, nausea, vomiting.  Patient states that the blister is painful but denies new redness or swelling to the right foot or leg.  Patient states that he has been compliant with all of his medications, states that his daily blood glucose is between 150 and 200.  HPI  Past Medical History:  Diagnosis Date  . Coronary artery disease   . Diabetes mellitus without complication (HCC)   . Hyperlipidemia   . Hypertension   . Osteomyelitis (HCC) 03/25/2017   RT FOOT    Patient Active Problem List   Diagnosis Date Noted  . Chest pain, moderate coronary artery risk 05/02/2018  . Hyperlipidemia 05/02/2018  . Tobacco abuse 05/02/2018  . Coronary artery disease involving native coronary artery of native heart with angina pectoris (HCC) 05/02/2018  . 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 Amputated great toe of right foot (HCC) 04/15/2017  . Ulcer of toe of left foot, limited to breakdown of skin (HCC) 04/04/2017  . 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 Edema     Past Surgical History:  Procedure Laterality Date  . AMPUTATION Right 03/27/2017   Procedure: 1st Ray Amputation Right  Foot;  Surgeon: Nadara Mustard, MD;  Location: [facility] OR;  Service: Orthopedics;  Laterality: Right;  . APPENDECTOMY    . CARDIAC CATHETERIZATION    . CORONARY STENT INTERVENTION  2005  . Great toe amputation right.    . I&D EXTREMITY Left 12/30/2015   Procedure: IRRIGATION AND DEBRIDEMENT LEFT FOOT, TRANSMETATARSAL AMPUTATION WITH APPLICATION OF ANTIBIOTIC BEADS AND WOUND VAC;  Surgeon: Nadara Mustard, MD;  Location: [facility] OR;  Service: Orthopedics;  Laterality: Left;  . TONSILLECTOMY          Home Medications    Prior to Admission medications   Medication Sig Start Date End Date Taking? Authorizing Provider  aspirin EC 81 MG tablet Take 81 mg by mouth daily.   Yes [provider]  atorvastatin (LIPITOR) 40 MG tablet Take 1 tablet (40 mg total) by mouth daily at 6 PM. 03/28/17  Yes Howard Pouch, MD  glipiZIDE (GLUCOTROL) 10 MG tablet Take 10 mg by mouth 2 (two) times daily before a meal.   Yes [provider]  Insulin Degludec (TRESIBA) 100 UNIT/ML SOLN Inject 70 Units into the skin at bedtime.    Yes [provider]  insulin regular (NOVOLIN R,HUMULIN R) 100 units/mL injection Inject 2-4 Units into the skin as needed for high blood sugar. Only of over 150 mg   Yes [provider]  lisinopril (PRINIVIL,ZESTRIL) 5 MG tablet Take 5 mg by mouth 2 (two) times daily.   Yes [provider]  metoprolol tartrate (LOPRESSOR) 25 MG tablet Take 25 mg by mouth 2 (two) times daily.   Yes [provider]  Multiple Vitamin (MULTIVITAMIN) tablet Take 1 tablet by mouth daily.   Yes [provider]  tapentadol HCl (NUCYNTA) 75 MG tablet Take 75 mg by mouth 4 (four) times daily.    Yes [provider]  cephALEXin (KEFLEX) 500 MG capsule Take 1 capsule (500 mg total) by mouth 4 (four) times daily for 14 days. 07/11/18 07/25/18  Harlene SaltsMorelli, Brandon A, PA-C  doxycycline (VIBRAMYCIN) 100 MG capsule Take 1 capsule (100 mg total) by mouth 2 (two) times daily  for 14 days. 07/11/18 07/25/18  Harlene SaltsMorelli, Brandon A, PA-C  insulin glargine (LANTUS) 100 UNIT/ML injection Inject 0.05 mLs (5 Units total) into the skin at bedtime. Patient not taking: Reported on 07/11/2018 03/28/17   Howard PouchFeng, Lauren, MD    Family History Family History  Problem Relation Age of Onset  . Diabetes Mother   . Emphysema Father   . CAD Sister [id number]       Died of MI after flu    Social History Social History   Tobacco Use  . Smoking status: Current Every Day Smoker    Packs/day: 2.00    Years: 35.00    Pack years: 70.00    Types: Cigarettes  . Smokeless tobacco: Never Used  Substance Use Topics  . Alcohol use: Yes    Comment: beers occasionally  . Drug use: No     Allergies   Iodinated diagnostic agents and Penicillins   Review of Systems Review of Systems  Constitutional: Negative.  Negative for chills and fever.  HENT: Negative.  Negative for rhinorrhea and sore throat.   Eyes: Negative.  Negative for visual disturbance.  Respiratory: Negative.  Negative for cough and shortness of breath.   Cardiovascular: Negative.  Negative for chest pain.  Gastrointestinal: Negative.  Negative for abdominal pain, blood in stool, diarrhea, nausea and vomiting.  Genitourinary: Negative.  Negative for dysuria and hematuria.  Musculoskeletal: Negative.  Negative for arthralgias and myalgias.  Skin: Negative for color change and rash.       Blister of right heel  Neurological: Negative.  Negative for dizziness, weakness and headaches.     Physical Exam Updated Vital Signs BP (!) 149/76   Pulse 74   Temp 98.8 F (37.1 C) (Oral)   Resp (!) 21   Ht 5\' 8"  (1.727 m)   Wt 95.3 kg   SpO2 94%   BMI 31.93 kg/m   Physical Exam  Constitutional: He is oriented to person, place, and time. He appears well-developed and well-nourished. No distress.  HENT:  Head: Normocephalic and atraumatic.  Right Ear: External ear normal.  Left Ear: External ear normal.  Nose: Nose normal.    Eyes: Pupils are equal, round, and reactive to light. EOM are normal.  Neck: Normal range of motion. Neck supple. No JVD present. No tracheal deviation present.  Cardiovascular: Normal rate, regular rhythm and normal heart sounds.  Pedal pulses intact to right foot. Unable to assess left foot pedal pulses due to BKA.  Pulmonary/Chest: Effort normal. No respiratory distress.  Abdominal: Soft. There is no tenderness. There is no rebound and no guarding.  Musculoskeletal: He exhibits tenderness.       Feet:  Right great toe amputation. Left below-knee amputation. Otherwise normal ROM.  Neurological: He is alert and oriented to person, place, and time. No sensory deficit.  Skin: Skin is warm and  dry. Capillary refill takes less than 2 seconds. No erythema.     ED Treatments / Results  Labs (all labs ordered are listed, but only abnormal results are displayed) Labs Reviewed  BASIC METABOLIC PANEL - Abnormal; Notable for the following components:      Result Value   Sodium 134 (*)    Glucose, Bld 345 (*)    BUN 21 (*)    All other components within normal limits  CBG MONITORING, ED - Abnormal; Notable for the following components:   Glucose-Capillary 294 (*)    All other components within normal limits  CBG MONITORING, ED - Abnormal; Notable for the following components:   Glucose-Capillary 155 (*)    All other components within normal limits  CBC WITH DIFFERENTIAL/PLATELET    EKG None  Radiology Dg Foot Complete Right  Result Date: 07/11/2018 CLINICAL DATA:  Blister on the heel. Amputation of the first metatarsal. EXAM: RIGHT FOOT COMPLETE - 3+ VIEW COMPARISON:  MR foot 03/25/2017 FINDINGS: Prior amputation of the first metatarsal. Partial resection of the distal aspect of the medial cuneiform. 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 No periosteal reaction or bone destruction. Small plantar calcaneal spur. Skin blister overlying the plantar posterior calcaneus. Dorsal  subluxation of the base of the second metatarsal relative to the middle cuneiform. IMPRESSION: No osteomyelitis of the right foot. Electronically Signed   By: Elige Ko   On: 07/11/2018 13:34    Procedures Procedures (including critical care time)  Medications Ordered in ED Medications  insulin aspart (novoLOG) injection 8 Units (8 Units Intravenous Given 07/11/18 1454)  sodium chloride 0.9 % bolus 500 mL (0 mLs Intravenous Stopped 07/11/18 1548)     Initial Impression / Assessment and Plan / ED Course  I have reviewed the triage vital signs and the nursing notes.  Pertinent labs & imaging results that were available during my care of the patient were reviewed by me and considered in my medical decision making (see chart for details).  Clinical Course as of Jul 12 1611  Fri Jul 11, 2018  1501 Patient informed of lab results and imaging results.  Patient resting comfortably in room, no acute distress.  Patient is agreeable to plan to lower blood sugar here in department and discharged with antibiotics and wound care referral.   [BM]    Clinical Course User Index [BM] Bill Salinas, PA-C   Patient presented for blister to the right heel.  Patient with history of osteomyelitis/left BKA and right MTP amputation, diabetes taking insulin, uses sliding scale at home.  Patient's blister is closed, no surrounding erythema or streaking, no increased warmth suggestive of infection at this time.  Patient with CBC within normal limits, BNP shows elevated glucose. Imaging negative for osteomyelitis at this time.  Patient uses sliding scale at home, states that he has eaten prior to arrival here in department, plan to lower blood glucose and give fluid bolus here in department to return patient to his normal blood glucose level around the 200 range.  Due to patient's history and risk of osteomyelitis patient will be treated with Keflex and doxycycline for 14 days and given referral to wound  clinic for follow-up.  Of note patient with distant history of penicillin allergy, patient states when he was a kid he was told he was allergic to PCN. Patient denies history of anaphylaxis, hives, shortness of breath.  Additionally a note shows patient received cefepime in April during his procedure, BKA, patient denies  complications from this medication.  I have spoken to Dr. Jacqulyn BathLong and we believe it is safe for the patient to use Keflex at this time. I have informed patient of signs and symptoms of allergic reaction including shortness of breath, cough, swelling, rash/hives and trouble swallowing and to stop taking the medications and return to the ED if they occur.  Repeat CBG of 155, on reevaluation patient is sitting comfortably at the door states willingness to be discharged at this time.  No acute distress.  Patient states understanding of care plan including Keflex and doxycycline with wound care follow-up.  Patient states that he has diabetes medication at home, denies needing refill at this time.  At this time there does not appear to be any evidence of an acute emergency medical condition and the patient appears stable for discharge with appropriate outpatient follow up. Diagnosis was discussed with patient who verbalizes understanding of care plan and is agreeable to discharge. I have discussed return precautions with patient who verbalizes understanding of return precautions. Patient strongly encouraged to follow-up with their PCP and wound care. All questions answered.  Patient's case discussed with Dr. Jacqulyn BathLong who agrees with plan to discharge with follow-up.     Note: Portions of this report may have been transcribed using voice recognition software. Every effort was made to ensure accuracy; however, inadvertent computerized transcription errors may still be present.   Final Clinical Impressions(s) / ED Diagnoses   Final diagnoses:  Blister of right heel, initial encounter    ED  Discharge Orders         Ordered    Ambulatory referral to Wound Clinic     07/11/18 1410    doxycycline (VIBRAMYCIN) 100 MG capsule  2 times daily,   Status:  Discontinued     07/11/18 1422    cephALEXin (KEFLEX) 500 MG capsule  4 times daily,   Status:  Discontinued     07/11/18 1422    cephALEXin (KEFLEX) 500 MG capsule  4 times daily     07/11/18 1458    doxycycline (VIBRAMYCIN) 100 MG capsule  2 times daily     07/11/18 1458           Elizabeth PalauMorelli, Brandon A, PA-C 07/11/18 1621    Maia PlanLong, Joshua G, MD 07/11/18 [phone]

## 2018-07-11 NOTE — Discharge Instructions (Addendum)
Please follow-up with the wound care center regarding your visit today as soon as possible. Please return to the emergency department for any new or worsening symptoms. Please also follow-up with your primary care provider regarding your visit today. Please take the 2 antibiotic medications as prescribed, doxycycline and Keflex for the next 14 days.  Please stop taking these medications and return to the emergency department if you have symptoms of allergies including shortness of breath, cough, rash, trouble swallowing or any other concerning symptoms. Please be sure to take your diabetes medications as prescribed by her primary care provider. Please read all attached instructions regarding your visit today.  Contact a health care provider if: You have more redness, swelling, or pain around your blister. You have more fluid or blood coming from your blister. Your blister feels warm to the touch. You have pus or a bad smell coming from your blister. You have a fever or chills. Your blister gets better and then it gets worse. Get help right away if: You have any of these: Swelling in your eyes, lips, face, or tongue. Swelling in the back of your mouth or your throat. Loud breathing. A hoarse voice. Itchy, red, swollen areas of skin. Trouble with breathing, talking, or swallowing. A tight feeling in your chest. A fast heartbeat. You have throwing up that gets very bad. You have watery poop that gets very bad. You feel dizzy or light-headed. You pass out.

## 2018-07-11 NOTE — ED Notes (Signed)
Pt alert and oriented in NAD. Pt verbalized understanding of discharge instructions. 

## 2018-07-11 NOTE — ED Notes (Signed)
Pt refused final set of vitals

## 2018-11-26 HISTORY — PX: TRACHEOSTOMY: SUR1362

## 2018-11-28 ENCOUNTER — Encounter (HOSPITAL_COMMUNITY): Payer: Self-pay | Admitting: Emergency Medicine

## 2018-11-28 ENCOUNTER — Emergency Department (HOSPITAL_COMMUNITY): Payer: Medicaid Other

## 2018-11-28 ENCOUNTER — Inpatient Hospital Stay (HOSPITAL_COMMUNITY)
Admission: EM | Admit: 2018-11-28 | Discharge: 2019-02-20 | DRG: 003 | Disposition: A | Discharge status: Skilled Nursing Facility | Payer: Medicaid Other | Attending: Internal Medicine | Admitting: Internal Medicine

## 2018-11-28 ENCOUNTER — Other Ambulatory Visit: Payer: Self-pay

## 2018-11-28 ENCOUNTER — Encounter (HOSPITAL_COMMUNITY): Payer: Self-pay

## 2018-11-28 ENCOUNTER — Inpatient Hospital Stay (HOSPITAL_COMMUNITY): Payer: Medicaid Other

## 2018-11-28 ENCOUNTER — Ambulatory Visit (HOSPITAL_COMMUNITY): Admit: 2018-11-28 | Payer: Medicaid Other | Admitting: Interventional Cardiology

## 2018-11-28 DIAGNOSIS — I10 Essential (primary) hypertension: Secondary | ICD-10-CM

## 2018-11-28 DIAGNOSIS — R570 Cardiogenic shock: Secondary | ICD-10-CM | POA: Diagnosis not present

## 2018-11-28 DIAGNOSIS — Y92239 Unspecified place in hospital as the place of occurrence of the external cause: Secondary | ICD-10-CM | POA: Diagnosis not present

## 2018-11-28 DIAGNOSIS — R0989 Other specified symptoms and signs involving the circulatory and respiratory systems: Secondary | ICD-10-CM

## 2018-11-28 DIAGNOSIS — R1110 Vomiting, unspecified: Secondary | ICD-10-CM

## 2018-11-28 DIAGNOSIS — R0682 Tachypnea, not elsewhere classified: Secondary | ICD-10-CM

## 2018-11-28 DIAGNOSIS — R000 Tachycardia, unspecified: Secondary | ICD-10-CM | POA: Diagnosis not present

## 2018-11-28 DIAGNOSIS — J9601 Acute respiratory failure with hypoxia: Secondary | ICD-10-CM | POA: Diagnosis not present

## 2018-11-28 DIAGNOSIS — R0689 Other abnormalities of breathing: Secondary | ICD-10-CM

## 2018-11-28 DIAGNOSIS — R1310 Dysphagia, unspecified: Secondary | ICD-10-CM

## 2018-11-28 DIAGNOSIS — E1110 Type 2 diabetes mellitus with ketoacidosis without coma: Secondary | ICD-10-CM | POA: Diagnosis present

## 2018-11-28 DIAGNOSIS — G92 Toxic encephalopathy: Secondary | ICD-10-CM | POA: Diagnosis not present

## 2018-11-28 DIAGNOSIS — I447 Left bundle-branch block, unspecified: Secondary | ICD-10-CM | POA: Diagnosis present

## 2018-11-28 DIAGNOSIS — R451 Restlessness and agitation: Secondary | ICD-10-CM | POA: Diagnosis not present

## 2018-11-28 DIAGNOSIS — D6489 Other specified anemias: Secondary | ICD-10-CM | POA: Diagnosis not present

## 2018-11-28 DIAGNOSIS — J189 Pneumonia, unspecified organism: Secondary | ICD-10-CM

## 2018-11-28 DIAGNOSIS — J9690 Respiratory failure, unspecified, unspecified whether with hypoxia or hypercapnia: Secondary | ICD-10-CM | POA: Diagnosis not present

## 2018-11-28 DIAGNOSIS — I5023 Acute on chronic systolic (congestive) heart failure: Secondary | ICD-10-CM | POA: Diagnosis present

## 2018-11-28 DIAGNOSIS — Z430 Encounter for attention to tracheostomy: Secondary | ICD-10-CM

## 2018-11-28 DIAGNOSIS — S279XXA Injury of unspecified intrathoracic organ, initial encounter: Secondary | ICD-10-CM

## 2018-11-28 DIAGNOSIS — I313 Pericardial effusion (noninflammatory): Secondary | ICD-10-CM | POA: Diagnosis present

## 2018-11-28 DIAGNOSIS — Z825 Family history of asthma and other chronic lower respiratory diseases: Secondary | ICD-10-CM

## 2018-11-28 DIAGNOSIS — A419 Sepsis, unspecified organism: Secondary | ICD-10-CM | POA: Diagnosis not present

## 2018-11-28 DIAGNOSIS — I248 Other forms of acute ischemic heart disease: Secondary | ICD-10-CM | POA: Diagnosis present

## 2018-11-28 DIAGNOSIS — Z930 Tracheostomy status: Secondary | ICD-10-CM | POA: Diagnosis not present

## 2018-11-28 DIAGNOSIS — Z751 Person awaiting admission to adequate facility elsewhere: Secondary | ICD-10-CM

## 2018-11-28 DIAGNOSIS — Z4659 Encounter for fitting and adjustment of other gastrointestinal appliance and device: Secondary | ICD-10-CM

## 2018-11-28 DIAGNOSIS — I110 Hypertensive heart disease with heart failure: Secondary | ICD-10-CM | POA: Diagnosis present

## 2018-11-28 DIAGNOSIS — I5021 Acute systolic (congestive) heart failure: Secondary | ICD-10-CM | POA: Diagnosis not present

## 2018-11-28 DIAGNOSIS — D62 Acute posthemorrhagic anemia: Secondary | ICD-10-CM | POA: Diagnosis not present

## 2018-11-28 DIAGNOSIS — H9190 Unspecified hearing loss, unspecified ear: Secondary | ICD-10-CM | POA: Diagnosis present

## 2018-11-28 DIAGNOSIS — Z89512 Acquired absence of left leg below knee: Secondary | ICD-10-CM

## 2018-11-28 DIAGNOSIS — Z8249 Family history of ischemic heart disease and other diseases of the circulatory system: Secondary | ICD-10-CM

## 2018-11-28 DIAGNOSIS — E876 Hypokalemia: Secondary | ICD-10-CM | POA: Diagnosis not present

## 2018-11-28 DIAGNOSIS — R6510 Systemic inflammatory response syndrome (SIRS) of non-infectious origin without acute organ dysfunction: Secondary | ICD-10-CM | POA: Diagnosis not present

## 2018-11-28 DIAGNOSIS — J440 Chronic obstructive pulmonary disease with acute lower respiratory infection: Secondary | ICD-10-CM | POA: Diagnosis present

## 2018-11-28 DIAGNOSIS — J101 Influenza due to other identified influenza virus with other respiratory manifestations: Secondary | ICD-10-CM

## 2018-11-28 DIAGNOSIS — L8990 Pressure ulcer of unspecified site, unspecified stage: Secondary | ICD-10-CM

## 2018-11-28 DIAGNOSIS — E871 Hypo-osmolality and hyponatremia: Secondary | ICD-10-CM | POA: Diagnosis not present

## 2018-11-28 DIAGNOSIS — R509 Fever, unspecified: Secondary | ICD-10-CM

## 2018-11-28 DIAGNOSIS — I420 Dilated cardiomyopathy: Secondary | ICD-10-CM | POA: Diagnosis not present

## 2018-11-28 DIAGNOSIS — R6521 Severe sepsis with septic shock: Secondary | ICD-10-CM | POA: Diagnosis present

## 2018-11-28 DIAGNOSIS — I2510 Atherosclerotic heart disease of native coronary artery without angina pectoris: Secondary | ICD-10-CM | POA: Diagnosis present

## 2018-11-28 DIAGNOSIS — Z833 Family history of diabetes mellitus: Secondary | ICD-10-CM

## 2018-11-28 DIAGNOSIS — Z89411 Acquired absence of right great toe: Secondary | ICD-10-CM

## 2018-11-28 DIAGNOSIS — J9600 Acute respiratory failure, unspecified whether with hypoxia or hypercapnia: Secondary | ICD-10-CM

## 2018-11-28 DIAGNOSIS — F419 Anxiety disorder, unspecified: Secondary | ICD-10-CM | POA: Diagnosis present

## 2018-11-28 DIAGNOSIS — N179 Acute kidney failure, unspecified: Secondary | ICD-10-CM | POA: Diagnosis not present

## 2018-11-28 DIAGNOSIS — Z955 Presence of coronary angioplasty implant and graft: Secondary | ICD-10-CM

## 2018-11-28 DIAGNOSIS — Z9119 Patient's noncompliance with other medical treatment and regimen: Secondary | ICD-10-CM

## 2018-11-28 DIAGNOSIS — E781 Pure hyperglyceridemia: Secondary | ICD-10-CM | POA: Diagnosis present

## 2018-11-28 DIAGNOSIS — R0602 Shortness of breath: Secondary | ICD-10-CM

## 2018-11-28 DIAGNOSIS — L89154 Pressure ulcer of sacral region, stage 4: Secondary | ICD-10-CM | POA: Diagnosis not present

## 2018-11-28 DIAGNOSIS — R0902 Hypoxemia: Secondary | ICD-10-CM

## 2018-11-28 DIAGNOSIS — I952 Hypotension due to drugs: Secondary | ICD-10-CM | POA: Diagnosis not present

## 2018-11-28 DIAGNOSIS — E875 Hyperkalemia: Secondary | ICD-10-CM | POA: Diagnosis present

## 2018-11-28 DIAGNOSIS — K5900 Constipation, unspecified: Secondary | ICD-10-CM | POA: Diagnosis not present

## 2018-11-28 DIAGNOSIS — Z720 Tobacco use: Secondary | ICD-10-CM | POA: Diagnosis not present

## 2018-11-28 DIAGNOSIS — R0603 Acute respiratory distress: Secondary | ICD-10-CM | POA: Diagnosis not present

## 2018-11-28 DIAGNOSIS — I5043 Acute on chronic combined systolic (congestive) and diastolic (congestive) heart failure: Secondary | ICD-10-CM | POA: Diagnosis not present

## 2018-11-28 DIAGNOSIS — I208 Other forms of angina pectoris: Secondary | ICD-10-CM | POA: Diagnosis not present

## 2018-11-28 DIAGNOSIS — J151 Pneumonia due to Pseudomonas: Secondary | ICD-10-CM | POA: Diagnosis present

## 2018-11-28 DIAGNOSIS — Z931 Gastrostomy status: Secondary | ICD-10-CM | POA: Diagnosis not present

## 2018-11-28 DIAGNOSIS — Z781 Physical restraint status: Secondary | ICD-10-CM

## 2018-11-28 DIAGNOSIS — Y95 Nosocomial condition: Secondary | ICD-10-CM | POA: Diagnosis present

## 2018-11-28 DIAGNOSIS — J810 Acute pulmonary edema: Secondary | ICD-10-CM

## 2018-11-28 DIAGNOSIS — Z978 Presence of other specified devices: Secondary | ICD-10-CM

## 2018-11-28 DIAGNOSIS — D638 Anemia in other chronic diseases classified elsewhere: Secondary | ICD-10-CM | POA: Diagnosis present

## 2018-11-28 DIAGNOSIS — G894 Chronic pain syndrome: Secondary | ICD-10-CM | POA: Diagnosis present

## 2018-11-28 DIAGNOSIS — J9621 Acute and chronic respiratory failure with hypoxia: Secondary | ICD-10-CM | POA: Diagnosis present

## 2018-11-28 DIAGNOSIS — E860 Dehydration: Secondary | ICD-10-CM | POA: Diagnosis not present

## 2018-11-28 DIAGNOSIS — J1008 Influenza due to other identified influenza virus with other specified pneumonia: Secondary | ICD-10-CM | POA: Diagnosis present

## 2018-11-28 DIAGNOSIS — R52 Pain, unspecified: Secondary | ICD-10-CM

## 2018-11-28 DIAGNOSIS — E870 Hyperosmolality and hypernatremia: Secondary | ICD-10-CM | POA: Diagnosis not present

## 2018-11-28 DIAGNOSIS — E11649 Type 2 diabetes mellitus with hypoglycemia without coma: Secondary | ICD-10-CM | POA: Diagnosis not present

## 2018-11-28 DIAGNOSIS — R079 Chest pain, unspecified: Secondary | ICD-10-CM

## 2018-11-28 DIAGNOSIS — Z9049 Acquired absence of other specified parts of digestive tract: Secondary | ICD-10-CM

## 2018-11-28 DIAGNOSIS — E785 Hyperlipidemia, unspecified: Secondary | ICD-10-CM | POA: Diagnosis present

## 2018-11-28 DIAGNOSIS — I255 Ischemic cardiomyopathy: Secondary | ICD-10-CM | POA: Diagnosis present

## 2018-11-28 DIAGNOSIS — L89150 Pressure ulcer of sacral region, unstageable: Secondary | ICD-10-CM | POA: Diagnosis present

## 2018-11-28 DIAGNOSIS — T82858A Stenosis of vascular prosthetic devices, implants and grafts, initial encounter: Secondary | ICD-10-CM | POA: Diagnosis not present

## 2018-11-28 DIAGNOSIS — Z431 Encounter for attention to gastrostomy: Secondary | ICD-10-CM

## 2018-11-28 DIAGNOSIS — J984 Other disorders of lung: Secondary | ICD-10-CM

## 2018-11-28 DIAGNOSIS — E669 Obesity, unspecified: Secondary | ICD-10-CM | POA: Diagnosis present

## 2018-11-28 DIAGNOSIS — I471 Supraventricular tachycardia: Secondary | ICD-10-CM | POA: Diagnosis not present

## 2018-11-28 DIAGNOSIS — I472 Ventricular tachycardia: Secondary | ICD-10-CM | POA: Diagnosis not present

## 2018-11-28 DIAGNOSIS — J111 Influenza due to unidentified influenza virus with other respiratory manifestations: Secondary | ICD-10-CM | POA: Diagnosis not present

## 2018-11-28 DIAGNOSIS — E440 Moderate protein-calorie malnutrition: Secondary | ICD-10-CM | POA: Diagnosis not present

## 2018-11-28 DIAGNOSIS — I5181 Takotsubo syndrome: Secondary | ICD-10-CM | POA: Diagnosis present

## 2018-11-28 DIAGNOSIS — J9620 Acute and chronic respiratory failure, unspecified whether with hypoxia or hypercapnia: Secondary | ICD-10-CM | POA: Diagnosis not present

## 2018-11-28 DIAGNOSIS — Z9911 Dependence on respirator [ventilator] status: Secondary | ICD-10-CM | POA: Diagnosis not present

## 2018-11-28 DIAGNOSIS — Z7982 Long term (current) use of aspirin: Secondary | ICD-10-CM

## 2018-11-28 DIAGNOSIS — E1151 Type 2 diabetes mellitus with diabetic peripheral angiopathy without gangrene: Secondary | ICD-10-CM | POA: Diagnosis present

## 2018-11-28 DIAGNOSIS — Z6831 Body mass index (BMI) 31.0-31.9, adult: Secondary | ICD-10-CM

## 2018-11-28 DIAGNOSIS — Z794 Long term (current) use of insulin: Secondary | ICD-10-CM

## 2018-11-28 DIAGNOSIS — E119 Type 2 diabetes mellitus without complications: Secondary | ICD-10-CM

## 2018-11-28 DIAGNOSIS — R0600 Dyspnea, unspecified: Secondary | ICD-10-CM

## 2018-11-28 DIAGNOSIS — Z9289 Personal history of other medical treatment: Secondary | ICD-10-CM

## 2018-11-28 DIAGNOSIS — I493 Ventricular premature depolarization: Secondary | ICD-10-CM | POA: Diagnosis present

## 2018-11-28 DIAGNOSIS — Z0189 Encounter for other specified special examinations: Secondary | ICD-10-CM

## 2018-11-28 DIAGNOSIS — R1313 Dysphagia, pharyngeal phase: Secondary | ICD-10-CM | POA: Diagnosis not present

## 2018-11-28 DIAGNOSIS — T17990A Other foreign object in respiratory tract, part unspecified in causing asphyxiation, initial encounter: Secondary | ICD-10-CM | POA: Diagnosis not present

## 2018-11-28 DIAGNOSIS — R740 Nonspecific elevation of levels of transaminase and lactic acid dehydrogenase [LDH]: Secondary | ICD-10-CM | POA: Diagnosis not present

## 2018-11-28 DIAGNOSIS — F17210 Nicotine dependence, cigarettes, uncomplicated: Secondary | ICD-10-CM | POA: Diagnosis present

## 2018-11-28 DIAGNOSIS — Y838 Other surgical procedures as the cause of abnormal reaction of the patient, or of later complication, without mention of misadventure at the time of the procedure: Secondary | ICD-10-CM | POA: Diagnosis not present

## 2018-11-28 DIAGNOSIS — Z79899 Other long term (current) drug therapy: Secondary | ICD-10-CM

## 2018-11-28 DIAGNOSIS — L89304 Pressure ulcer of unspecified buttock, stage 4: Secondary | ICD-10-CM | POA: Diagnosis not present

## 2018-11-28 DIAGNOSIS — J1100 Influenza due to unidentified influenza virus with unspecified type of pneumonia: Secondary | ICD-10-CM | POA: Diagnosis not present

## 2018-11-28 DIAGNOSIS — T4275XA Adverse effect of unspecified antiepileptic and sedative-hypnotic drugs, initial encounter: Secondary | ICD-10-CM | POA: Diagnosis not present

## 2018-11-28 DIAGNOSIS — E11628 Type 2 diabetes mellitus with other skin complications: Secondary | ICD-10-CM | POA: Diagnosis present

## 2018-11-28 LAB — CBC
HCT: 52.4 % — ABNORMAL HIGH (ref 39.0–52.0)
Hemoglobin: 17.2 g/dL — ABNORMAL HIGH (ref 13.0–17.0)
MCH: 29 pg (ref 26.0–34.0)
MCHC: 32.8 g/dL (ref 30.0–36.0)
MCV: 88.2 fL (ref 80.0–100.0)
NRBC: 0 % (ref 0.0–0.2)
Platelets: 163 10*3/uL (ref 150–400)
RBC: 5.94 MIL/uL — ABNORMAL HIGH (ref 4.22–5.81)
RDW: 12.7 % (ref 11.5–15.5)
WBC: 6.8 10*3/uL (ref 4.0–10.5)

## 2018-11-28 LAB — BASIC METABOLIC PANEL
Anion gap: 11 (ref 5–15)
BUN: 13 mg/dL (ref 6–20)
CO2: 24 mmol/L (ref 22–32)
Calcium: 9.4 mg/dL (ref 8.9–10.3)
Chloride: 96 mmol/L — ABNORMAL LOW (ref 98–111)
Creatinine, Ser: 1.09 mg/dL (ref 0.61–1.24)
GFR calc Af Amer: >60 mL/min (ref >60)
GFR calc non Af Amer: >60 mL/min (ref >60)
Glucose, Bld: 317 mg/dL — ABNORMAL HIGH (ref 70–99)
Potassium: 4.9 mmol/L (ref 3.5–5.1)
SODIUM: 131 mmol/L — AB (ref 135–145)

## 2018-11-28 LAB — RESPIRATORY PANEL BY PCR
Adenovirus: NOT DETECTED
Bordetella pertussis: NOT DETECTED
Chlamydophila pneumoniae: NOT DETECTED
Coronavirus 229E: NOT DETECTED
Coronavirus HKU1: NOT DETECTED
Coronavirus NL63: NOT DETECTED
Coronavirus OC43: NOT DETECTED
INFLUENZA B-RVPPCR: NOT DETECTED
Influenza A H1 2009: DETECTED — AB
Metapneumovirus: NOT DETECTED
Mycoplasma pneumoniae: NOT DETECTED
Parainfluenza Virus 1: NOT DETECTED
Parainfluenza Virus 2: NOT DETECTED
Parainfluenza Virus 3: NOT DETECTED
Parainfluenza Virus 4: NOT DETECTED
RESPIRATORY SYNCYTIAL VIRUS-RVPPCR: NOT DETECTED
Rhinovirus / Enterovirus: NOT DETECTED

## 2018-11-28 LAB — I-STAT CG4 LACTIC ACID, ED
LACTIC ACID, VENOUS: 1.53 mmol/L (ref 0.5–1.9)
LACTIC ACID, VENOUS: 1.51 mmol/L (ref 0.5–1.9)

## 2018-11-28 LAB — AMMONIA: Ammonia: 55 umol/L — ABNORMAL HIGH (ref 9–35)

## 2018-11-28 LAB — I-STAT CHEM 8, ED
BUN: 20 mg/dL (ref 6–20)
BUN: 24 mg/dL — ABNORMAL HIGH (ref 6–20)
Calcium, Ion: 1.17 mmol/L (ref 1.15–1.40)
Calcium, Ion: 1.09 mmol/L — ABNORMAL LOW (ref 1.15–1.40)
Chloride: 99 mmol/L (ref 98–111)
Chloride: 100 mmol/L (ref 98–111)
Creatinine, Ser: 1 mg/dL (ref 0.61–1.24)
Creatinine, Ser: 1 mg/dL (ref 0.61–1.24)
GLUCOSE: 341 mg/dL — AB (ref 70–99)
Glucose, Bld: 320 mg/dL — ABNORMAL HIGH (ref 70–99)
HCT: 54 % — ABNORMAL HIGH (ref 39.0–52.0)
HCT: 44 % (ref 39.0–52.0)
Hemoglobin: 18.4 g/dL — ABNORMAL HIGH (ref 13.0–17.0)
Hemoglobin: 15 g/dL (ref 13.0–17.0)
Potassium: 5 mmol/L (ref 3.5–5.1)
Potassium: 5 mmol/L (ref 3.5–5.1)
Sodium: 132 mmol/L — ABNORMAL LOW (ref 135–145)
Sodium: 131 mmol/L — ABNORMAL LOW (ref 135–145)
TCO2: 26 mmol/L (ref 22–32)
TCO2: 26 mmol/L (ref 22–32)

## 2018-11-28 LAB — HEPATIC FUNCTION PANEL
ALK PHOS: 84 U/L (ref 38–126)
ALT: 23 U/L (ref 0–44)
AST: 28 U/L (ref 15–41)
Albumin: 3.7 g/dL (ref 3.5–5.0)
Bilirubin, Direct: <0.1 mg/dL (ref 0.0–0.2)
Total Bilirubin: 0.7 mg/dL (ref 0.3–1.2)
Total Protein: 7.2 g/dL (ref 6.5–8.1)

## 2018-11-28 LAB — I-STAT TROPONIN, ED
Troponin i, poc: 0.03 ng/mL (ref 0.00–0.08)
Troponin i, poc: 0.04 ng/mL (ref 0.00–0.08)

## 2018-11-28 LAB — BRAIN NATRIURETIC PEPTIDE: B NATRIURETIC PEPTIDE 5: 113 pg/mL — AB (ref 0.0–100.0)

## 2018-11-28 LAB — I-STAT VENOUS BLOOD GAS, ED
Bicarbonate: 24.7 mmol/L (ref 20.0–28.0)
O2 Saturation: 85 %
PCO2 VEN: 38.3 mmHg — AB (ref 44.0–60.0)
PH VEN: 7.418 (ref 7.250–7.430)
TCO2: 26 mmol/L (ref 22–32)
pO2, Ven: 49 mmHg — ABNORMAL HIGH (ref 32.0–45.0)

## 2018-11-28 LAB — ACETAMINOPHEN LEVEL: Acetaminophen (Tylenol), Serum: <10 ug/mL — ABNORMAL LOW (ref 10–30)

## 2018-11-28 LAB — PROTIME-INR
INR: 0.97
Prothrombin Time: 12.8 seconds (ref 11.4–15.2)

## 2018-11-28 LAB — SALICYLATE LEVEL: Salicylate Lvl: <7 mg/dL (ref 2.8–30.0)

## 2018-11-28 LAB — D-DIMER, QUANTITATIVE: D-Dimer, Quant: 0.66 ug/mL-FEU — ABNORMAL HIGH (ref 0.00–0.50)

## 2018-11-28 LAB — MAGNESIUM: Magnesium: 1.7 mg/dL (ref 1.7–2.4)

## 2018-11-28 LAB — T4, FREE: Free T4: 0.87 ng/dL (ref 0.82–1.77)

## 2018-11-28 LAB — TSH: TSH: 0.374 u[IU]/mL (ref 0.350–4.500)

## 2018-11-28 LAB — APTT: aPTT: 31 seconds (ref 24–36)

## 2018-11-28 LAB — PROCALCITONIN: Procalcitonin: 0.11 ng/mL

## 2018-11-28 SURGERY — CORONARY/GRAFT ACUTE MI REVASCULARIZATION
Anesthesia: LOCAL

## 2018-11-28 MED ORDER — PROPOFOL 1000 MG/100ML IV EMUL
0.0000 ug/kg/min | INTRAVENOUS | Status: DC
  Administered 2018-11-29 (×3): 50 ug/kg/min via INTRAVENOUS
  Filled 2018-11-28 (×4): qty 100

## 2018-11-28 MED ORDER — LACTATED RINGERS IV BOLUS
1000.0000 mL | Freq: Once | INTRAVENOUS | Status: AC
  Administered 2018-11-28: 1000 mL via INTRAVENOUS

## 2018-11-28 MED ORDER — ROCURONIUM BROMIDE 50 MG/5ML IV SOLN
INTRAVENOUS | Status: AC | PRN
  Administered 2018-11-28: 100 mg via INTRAVENOUS

## 2018-11-28 MED ORDER — PIPERACILLIN-TAZOBACTAM 3.375 G IVPB 30 MIN
3.3750 g | Freq: Once | INTRAVENOUS | Status: AC
  Administered 2018-11-28: 3.375 g via INTRAVENOUS
  Filled 2018-11-28: qty 50

## 2018-11-28 MED ORDER — FENTANYL 2500MCG IN NS 250ML (10MCG/ML) PREMIX INFUSION
0.0000 ug/h | INTRAVENOUS | Status: DC
  Administered 2018-11-28: 40 ug/h via INTRAVENOUS
  Administered 2018-11-30: 100 ug/h via INTRAVENOUS
  Administered 2018-11-30 – 2018-12-01 (×2): 200 ug/h via INTRAVENOUS
  Filled 2018-11-28 (×4): qty 250

## 2018-11-28 MED ORDER — VANCOMYCIN HCL 10 G IV SOLR
2000.0000 mg | Freq: Once | INTRAVENOUS | Status: AC
  Administered 2018-11-28: 2000 mg via INTRAVENOUS
  Filled 2018-11-28: qty 2000

## 2018-11-28 MED ORDER — ASPIRIN EC 81 MG PO TBEC: 81.0000 mg | DELAYED_RELEASE_TABLET | Freq: Every day | ORAL | Status: DC

## 2018-11-28 MED ORDER — OSELTAMIVIR PHOSPHATE 75 MG PO CAPS
75.0000 mg | ORAL_CAPSULE | Freq: Once | ORAL | Status: AC
  Administered 2018-11-28: 75 mg via ORAL
  Filled 2018-11-28: qty 1

## 2018-11-28 MED ORDER — PROPOFOL 1000 MG/100ML IV EMUL
INTRAVENOUS | Status: AC
  Filled 2018-11-28: qty 100

## 2018-11-28 MED ORDER — PROPOFOL 1000 MG/100ML IV EMUL
5.0000 ug/kg/min | INTRAVENOUS | Status: DC
  Administered 2018-11-28: 5 ug/kg/min via INTRAVENOUS

## 2018-11-28 MED ORDER — FAMOTIDINE IN NACL 20-0.9 MG/50ML-% IV SOLN
20.0000 mg | Freq: Two times a day (BID) | INTRAVENOUS | Status: DC
  Administered 2018-11-29 – 2018-12-03 (×8): 20 mg via INTRAVENOUS
  Filled 2018-11-28 (×10): qty 50

## 2018-11-28 MED ORDER — FENTANYL CITRATE (PF) 100 MCG/2ML IJ SOLN
100.0000 ug | INTRAMUSCULAR | Status: DC | PRN
  Administered 2018-11-29: 100 ug via INTRAVENOUS
  Filled 2018-11-28: qty 2

## 2018-11-28 MED ORDER — INSULIN ASPART 100 UNIT/ML ~~LOC~~ SOLN: 2.0000 [IU] | SUBCUTANEOUS | Status: DC

## 2018-11-28 MED ORDER — ETOMIDATE 2 MG/ML IV SOLN
INTRAVENOUS | Status: AC | PRN
  Administered 2018-11-28: 20 mg via INTRAVENOUS

## 2018-11-28 MED ORDER — ACETAMINOPHEN 500 MG PO TABS
1000.0000 mg | ORAL_TABLET | Freq: Once | ORAL | Status: AC
  Administered 2018-11-28: 1000 mg via ORAL
  Filled 2018-11-28: qty 2

## 2018-11-28 MED ORDER — OSELTAMIVIR PHOSPHATE 75 MG PO CAPS
75.0000 mg | ORAL_CAPSULE | Freq: Two times a day (BID) | ORAL | Status: DC
  Filled 2018-11-28: qty 1

## 2018-11-28 MED ORDER — FENTANYL CITRATE (PF) 100 MCG/2ML IJ SOLN
50.0000 ug | Freq: Once | INTRAMUSCULAR | Status: AC
  Administered 2018-11-29: 50 ug via INTRAVENOUS
  Filled 2018-11-28: qty 2

## 2018-11-28 MED ORDER — ATORVASTATIN CALCIUM 40 MG PO TABS
40.0000 mg | ORAL_TABLET | Freq: Every day | ORAL | Status: DC
  Administered 2018-11-29 – 2019-01-04 (×35): 40 mg via ORAL
  Filled 2018-11-28 (×34): qty 1

## 2018-11-28 MED ORDER — FENTANYL CITRATE (PF) 100 MCG/2ML IJ SOLN
100.0000 ug | INTRAMUSCULAR | Status: DC | PRN
  Administered 2018-12-01: 100 ug via INTRAVENOUS

## 2018-11-28 MED ORDER — ENOXAPARIN SODIUM 40 MG/0.4ML ~~LOC~~ SOLN
40.0000 mg | Freq: Every day | SUBCUTANEOUS | Status: DC
  Administered 2018-11-29 – 2018-12-22 (×24): 40 mg via SUBCUTANEOUS
  Filled 2018-11-28 (×24): qty 0.4

## 2018-11-28 NOTE — ED Triage Notes (Signed)
Pt to ED via [facility] with c/o mid sternal chest pain onset this am.  Pt also c/o shortness of breath.  At this time pt st's pain has subsided.  Pt is diabetic but st's he has not taken his meds in approx 3 days.  Pt was given ASA 324mg  by EMS

## 2018-11-28 NOTE — ED Notes (Signed)
Pt to CT at this time.

## 2018-11-28 NOTE — H&P (Signed)
PULMONARY / CRITICAL CARE MEDICINE   NAME:  Antonio RichardsDaniel Watson Burget Jr., Antonio [name]:  [id number], DOB:  [birth year], LOS: 0 ADMISSION DATE:  11/28/2018, CONSULTATION DATE: 11/28/2018 REFERRING MD: Emergency room physician, CHIEF COMPLAINT: Shortness of breath  BRIEF HISTORY:    63 year old male coming in with fever cough chest pain and shortness of breath was found to have influenza A and is being intubated for acute hypoxemic respite failure.  HISTORY OF PRESENT ILLNESS   [name] is a 63 year old male with history of diabetes mellitus and hypertension coming in with 1 day history of shortness of breath and chest pain.  As per his son patient was started to get confused today and he was complaining of chest pain and shortness of breath.  Patient was brought into the emergency room and was thought that he was having a STEMI but this was ruled out and he was positive for influenza A.  Hospitalist were called but then while he was in the emergency room patient started becoming more tachypneic and desaturating down to the 80s on 5 L nasal cannula so we were consulted.  When I came to assess patient patient was in moderate distress confused answer simple questions but most of the history was taken from his son.  After discussion with the emergency room physician due to his deterioration a decision was made to intubate the patient.  Patient was also found febrile and has been coughing up nonproductive sputum.   SIGNIFICANT PAST MEDICAL HISTORY    . Diabetes mellitus without complication (HCC)   . Hyperlipidemia   . Hypertension         Past Surgical History:  Procedure Laterality Date  . CARDIAC CATHETERIZATION    . Great toe amputation right.    . I&D EXTREMITY Left 12/30/2015   Procedure: IRRIGATION AND DEBRIDEMENT LEFT FOOT, TRANSMETATARSAL AMPUTATION WITH APPLICATION OF ANTIBIOTIC BEADS AND WOUND VAC;  Surgeon: Antonio MustardMarcus Duda V, MD;  Location: [facility] OR;  Service: Orthopedics;  Laterality: Left;  .  TONSILLECTOMY       SIGNIFICANT EVENTS:    STUDIES:    CT abdomen and chest without contrast 1. Airway thickening is present, suggesting bronchitis or reactive airways disease. 2. Coronary, aortic arch, and branch vessel atherosclerotic vascular disease. Aortic Atherosclerosis (ICD10-I70.0). 3. Borderline enlarged right paratracheal lymph node, nonspecific. 4. Motion artifact and suboptimal arm positioning adversely affect sensitivity and specificity.   CULTURES:  Positive for influenza A  ANTIBIOTICS:  Tamiflu 11/28/2018  LINES/TUBES:   Endotracheal tube 11/28/2018 CONSULTANTS:     SUBJECTIVE:    CONSTITUTIONAL: BP (!) 146/53   Pulse (!) 105   Temp (!) 103.2 F (39.6 C)   Resp (!) 41   Ht 6' (1.829 m)   Wt 104.3 kg   SpO2 99%   BMI 31.19 kg/m   I/O last 3 completed shifts: In: 3250 [IV Piggyback:50] Out: -      Vent Mode: PRVC FiO2 (%):  [100 %] 100 % Set Rate:  [18 bmp] 18 bmp Vt Set:  [409[620 mL] 620 mL PEEP:  [5 cmH20] 5 cmH20 Plateau Pressure:  [22 cmH20] 22 cmH20  PHYSICAL EXAM: General: Acutely ill looking toxic looking tachypneic and moderate respiratory distress Neuro: Confused answer simple questions moving all extremities HEENT: Dry mucous membranes Cardiovascular: Tachycardic no murmurs no added sounds Lungs: Clear equal air sounds bilateral no crackles no wheezing Abdomen: Soft no tenderness no organomegaly Musculoskeletal: No lower limb edema patient has below-knee amputation and notable toe amputations Skin: No  skin rash  RESOLVED PROBLEM LIST   ASSESSMENT AND PLAN   Assessment: -Acute hypoxemic respiratory failure requiring mechanical ventilation -Acute influenza A -Hyperkalemia -Diabetes mellitus with hyperglycemia -Hyponatremia  Plan: -Admit to the intensive care and for further management -Discussed with emergency room physician and hospitalist a decision was made to intubate patient since his progression and worsening  hypoxia despite increasing oxygen requirement -Start Tamiflu 75 mg p.o. twice daily -IV fluids -Propofol and fentanyl keep patient sedated -Start insulin sliding scale -Repeat BMP and follow potassium level which is borderline high -DVT prophylaxis -GI prophylaxis -Hold home antihypertensive medicines for now -Consult dietitian for tube feeds -Discussed plan of care with the son and daughter and they agreed with the plan  I have spent 45 minutes of critical care time this time was spent bedside or in the unit this time was exclusive any billable procedures patient is needing intensive care due to acute respiratory failure requiring  mechanical ventilation  SUMMARY OF TODAY'S PLAN:    Best Practice / Goals of Care / Disposition.   DVT PROPHYLAXIS: Lovenox SUP: PPI NUTRITION: N.p.o. MOBILITY: Bedrest GOALS OF CARE: Full code FAMILY DISCUSSIONS: Discussed with patient son and his daughter DISPOSITION ICU admission  LABS  Glucose No results for input(s): GLUCAP in the last 168 hours.  BMET Recent Labs  Lab 11/28/18 1658 11/28/18 1719 11/28/18 2037  NA 131* 132* 131*  K 4.9 5.0 5.0  CL 96* 99 100  CO2 24  --   --   BUN 13 20 24*  CREATININE 1.09 1.00 1.00  GLUCOSE 317* 320* 341*    Liver Enzymes Recent Labs  Lab 11/28/18 1725  AST 28  ALT 23  ALKPHOS 84  BILITOT 0.7  ALBUMIN 3.7    Electrolytes Recent Labs  Lab 11/28/18 1658 11/28/18 2013  CALCIUM 9.4  --   MG  --  1.7    CBC Recent Labs  Lab 11/28/18 1658 11/28/18 1719 11/28/18 2037  WBC 6.8  --   --   HGB 17.2* 18.4* 15.0  HCT 52.4* 54.0* 44.0  PLT 163  --   --     ABG No results for input(s): PHART, PCO2ART, PO2ART in the last 168 hours.  Coag's Recent Labs  Lab 11/28/18 1658  APTT 31  INR 0.97    Sepsis Markers Recent Labs  Lab 11/28/18 1725 11/28/18 1759 11/28/18 2036  LATICACIDVEN  --  1.53 1.51  PROCALCITON 0.11  --   --     Cardiac Enzymes No results for input(s):  TROPONINI, PROBNP in the last 168 hours.  PAST MEDICAL HISTORY :   He  has a past medical history of Coronary artery disease, Diabetes mellitus without complication (HCC), Hyperlipidemia, Hypertension, and Osteomyelitis (HCC) (03/25/2017).  PAST SURGICAL HISTORY:  He  has a past surgical history that includes Cardiac catheterization; Great toe amputation right.; I&D extremity (Left, 12/30/2015); Tonsillectomy; Appendectomy; CORONARY STENT INTERVENTION (2005); and Amputation (Right, 03/27/2017).  Allergies  Allergen Reactions  . Iodinated Diagnostic Agents Hives, Rash and Other (See Comments)    Blisters Staph Blisters / staph  . Penicillins Other (See Comments)    UNSPECIFIED REACTION  Has patient had a PCN reaction causing immediate rash, facial/tongue/throat swelling, SOB or lightheadedness with hypotension: No Has patient had a PCN reaction causing severe rash involving mucus membranes or skin necrosis: No Has patient had a PCN reaction that required hospitalization: No Has patient had a PCN reaction occurring within the last 10 years: No If  all of the above answers are "NO", then may proceed with Cephalosporin use.    No current facility-administered medications on file prior to encounter.    Current Outpatient Medications on File Prior to Encounter  Medication Sig  . aspirin EC 81 MG tablet Take 81 mg by mouth daily.  Marland Kitchen atorvastatin (LIPITOR) 40 MG tablet Take 1 tablet (40 mg total) by mouth daily at 6 PM.  . glipiZIDE (GLUCOTROL) 10 MG tablet Take 10 mg by mouth 2 (two) times daily before a meal.  . Insulin Degludec (TRESIBA) 100 UNIT/ML SOLN Inject 70 Units into the skin at bedtime.   . insulin glargine (LANTUS) 100 UNIT/ML injection Inject 0.05 mLs (5 Units total) into the skin at bedtime.  . insulin regular (NOVOLIN R,HUMULIN R) 100 units/mL injection Inject 2-4 Units into the skin as needed for high blood sugar. Only of over 150 mg  . lisinopril (PRINIVIL,ZESTRIL) 5 MG tablet  Take 5 mg by mouth 2 (two) times daily.  . metoprolol tartrate (LOPRESSOR) 25 MG tablet Take 25 mg by mouth 2 (two) times daily.  . Multiple Vitamin (MULTIVITAMIN) tablet Take 1 tablet by mouth daily.  . tapentadol HCl (NUCYNTA) 75 MG tablet Take 75 mg by mouth 4 (four) times daily.     FAMILY HISTORY:   His family history includes CAD (age of onset: 44) in his sister; Diabetes in his mother; Emphysema in his father.  SOCIAL HISTORY:  He  reports that he has been smoking cigarettes. He has a 70.00 pack-year smoking history. He has never used smokeless tobacco. He reports current alcohol use. He reports that he does not use drugs.  REVIEW OF SYSTEMS:    All 11 point system review were unremarkable other than what is mentioned in the history of present illness

## 2018-11-28 NOTE — ED Notes (Signed)
Critical Care MD at bedside.  Preparing pt to be intubated

## 2018-11-28 NOTE — ED Provider Notes (Signed)
[facility] EMERGENCY DEPARTMENT Provider Note   CSN: [id number] Arrival date & time: 11/28/18  1655     History   Chief Complaint Chief Complaint  Patient presents with  . Chest Pain  . Shortness of Breath    HPI Antonio Douville. is a 63 y.o. male.  HPI  Patient is a 62 year old male with a past medical history of HTN, HDL, DM, CAD, PVD status post left BKA who presents via EMS from home for further evaluation management of shortness of breath associated with chest pressure that began approximately 3 to 4 hours prior to arrival.  Patient denies any recent fevers, vomiting, diarrhea, dysuria, abdominal pain, rash, or extremity pain.  Per family patient has been very fatigued and could been complaining of generalized myalgias over the last 2 to 3 days.  Patient denies prior similar episodes.  He denies alleviating or aggravating factors.  Patient is unable to find any other distal history.  States he has been compliant with his diabetes and blood pressure medicines.  Past Medical History:  Diagnosis Date  . Coronary artery disease   . Diabetes mellitus without complication (HCC)   . Hyperlipidemia   . Hypertension   . Osteomyelitis (HCC) 03/25/2017   RT FOOT    Patient Active Problem List   Diagnosis Date Noted  . SIRS (systemic inflammatory response syndrome) (HCC) 11/28/2018  . Acute hypoxemic respiratory failure (HCC) 11/28/2018  . Influenza A   . Hyponatremia   . Chest pain, moderate coronary artery risk 05/02/2018  . Hyperlipidemia 05/02/2018  . Tobacco abuse 05/02/2018  . Coronary artery disease involving native coronary artery of native heart with angina pectoris (HCC) 05/02/2018  . 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 Amputated great toe of right foot (HCC) 04/15/2017  . Ulcer of toe of left foot, limited to breakdown of skin (HCC) 04/04/2017  . 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 Edema     Past Surgical History:  Procedure Laterality Date  . AMPUTATION Right 03/27/2017   Procedure: 1st Ray Amputation Right Foot;  Surgeon: Nadara Mustard, MD;  Location: [facility] OR;  Service: Orthopedics;  Laterality: Right;  . APPENDECTOMY    . CARDIAC CATHETERIZATION    . CORONARY STENT INTERVENTION  2005  . Great toe amputation right.    . I&D EXTREMITY Left 12/30/2015   Procedure: IRRIGATION AND DEBRIDEMENT LEFT FOOT, TRANSMETATARSAL AMPUTATION WITH APPLICATION OF ANTIBIOTIC BEADS AND WOUND VAC;  Surgeon: Nadara Mustard, MD;  Location: [facility] OR;  Service: Orthopedics;  Laterality: Left;  . TONSILLECTOMY          Home Medications    Prior to Admission medications   Medication Sig Start Date End Date Taking? Authorizing Provider  aspirin EC 81 MG tablet Take 81 mg by mouth daily.   Yes [provider]  atorvastatin (LIPITOR) 40 MG tablet Take 1 tablet (40 mg total) by mouth daily at 6 PM. 03/28/17  Yes Howard Pouch, MD  glipiZIDE (GLUCOTROL) 10 MG tablet Take 10 mg by mouth 2 (two) times daily before a meal.   Yes [provider]  Insulin Degludec (TRESIBA) 100 UNIT/ML SOLN Inject 70 Units into the skin at bedtime.    Yes [provider]  insulin glargine (LANTUS) 100 UNIT/ML injection Inject 0.05 mLs (5 Units total) into the skin at bedtime. 03/28/17  Yes Howard Pouch,  MD  insulin regular (NOVOLIN R,HUMULIN R) 100 units/mL injection Inject 2-4 Units into the skin as needed for high blood sugar. Only of over 150 mg   Yes [provider]  lisinopril (PRINIVIL,ZESTRIL) 5 MG tablet Take 5 mg by mouth 2 (two) times daily.   Yes [provider]  metoprolol tartrate (LOPRESSOR) 25 MG tablet Take 25 mg by mouth 2 (two) times daily.   Yes [provider]  Multiple Vitamin (MULTIVITAMIN) tablet Take 1 tablet by mouth daily.   Yes [provider]  tapentadol HCl (NUCYNTA) 75 MG tablet  Take 75 mg by mouth 4 (four) times daily.    Yes [provider]    Family History Family History  Problem Relation Age of Onset  . Diabetes Mother   . Emphysema Father   . CAD Sister 50       Died of MI after flu    Social History Social History   Tobacco Use  . Smoking status: Current Every Day Smoker    Packs/day: 2.00    Years: 35.00    Pack years: 70.00    Types: Cigarettes  . Smokeless tobacco: Never Used  Substance Use Topics  . Alcohol use: Yes    Comment: beers occasionally  . Drug use: No     Allergies   Iodinated diagnostic agents and Penicillins   Review of Systems Review of Systems  Constitutional: Negative for chills and fever.  HENT: Negative for ear pain and sore throat.   Eyes: Negative for pain and visual disturbance.  Respiratory: Positive for shortness of breath. Negative for cough.   Cardiovascular: Positive for chest pain. Negative for palpitations.  Gastrointestinal: Negative for abdominal pain and vomiting.  Genitourinary: Negative for dysuria and hematuria.  Musculoskeletal: Negative for arthralgias and back pain.  Skin: Negative for color change and rash.  Neurological: Negative for seizures and syncope.  All other systems reviewed and are negative.    Physical Exam Updated Vital Signs BP (!) 187/115   Pulse (!) 138   Temp (!) 103.2 F (39.6 C)   Resp (!) 22   Ht 6' (1.829 m)   Wt 104.3 kg   SpO2 96%   BMI 31.19 kg/m   Physical Exam Vitals signs and nursing note reviewed.  Constitutional:      Appearance: He is well-developed.  HENT:     Head: Normocephalic and atraumatic.  Eyes:     Conjunctiva/sclera: Conjunctivae normal.  Neck:     Musculoskeletal: Neck supple.  Cardiovascular:     Rate and Rhythm: Regular rhythm. Tachycardia present.     Heart sounds: No murmur.  Pulmonary:     Effort: Pulmonary effort is normal. No respiratory distress.     Breath sounds: Normal breath sounds.  Abdominal:      Palpations: Abdomen is soft.     Tenderness: There is no abdominal tenderness.  Feet:     Left foot:     Amputation: Left leg is amputated below ankle.  Skin:    General: Skin is warm and moist.     Capillary Refill: Capillary refill takes 2 to 3 seconds.  Neurological:     General: No focal deficit present.     Mental Status: He is alert.      ED Treatments / Results  Labs (all labs ordered are listed, but only abnormal results are displayed) Labs Reviewed  RESPIRATORY PANEL BY PCR - Abnormal; Notable for the following components:  Result Value   Influenza A H1 2009 DETECTED (*)    All other components within normal limits  BASIC METABOLIC PANEL - Abnormal; Notable for the following components:   Sodium 131 (*)    Chloride 96 (*)    Glucose, Bld 317 (*)    All other components within normal limits  CBC - Abnormal; Notable for the following components:   RBC 5.94 (*)    Hemoglobin 17.2 (*)    HCT 52.4 (*)    All other components within normal limits  BRAIN NATRIURETIC PEPTIDE - Abnormal; Notable for the following components:   B Natriuretic Peptide 113.0 (*)    All other components within normal limits  AMMONIA - Abnormal; Notable for the following components:   Ammonia 55 (*)    All other components within normal limits  ACETAMINOPHEN LEVEL - Abnormal; Notable for the following components:   Acetaminophen (Tylenol), Serum <10 (*)    All other components within normal limits  D-DIMER, QUANTITATIVE (NOT AT [facility]) - Abnormal; Notable for the following components:   D-Dimer, Quant 0.66 (*)    All other components within normal limits  I-STAT CHEM 8, ED - Abnormal; Notable for the following components:   Sodium 132 (*)    Glucose, Bld 320 (*)    Hemoglobin 18.4 (*)    HCT 54.0 (*)    All other components within normal limits  I-STAT VENOUS BLOOD GAS, ED - Abnormal; Notable for the following components:   pCO2, Ven 38.3 (*)    pO2, Ven 49.0 (*)    All other  components within normal limits  I-STAT CHEM 8, ED - Abnormal; Notable for the following components:   Sodium 131 (*)    BUN 24 (*)    Glucose, Bld 341 (*)    Calcium, Ion 1.09 (*)    All other components within normal limits  POCT I-STAT 3, ART BLOOD GAS (G3+) - Abnormal; Notable for the following components:   pH, Arterial 7.124 (*)    pCO2 arterial 70.3 (*)    pO2, Arterial 223.0 (*)    Acid-base deficit 8.0 (*)    All other components within normal limits  CULTURE, BLOOD (ROUTINE X 2)  CULTURE, BLOOD (ROUTINE X 2)  URINE CULTURE  PROTIME-INR  APTT  PROCALCITONIN  HEPATIC FUNCTION PANEL  SALICYLATE LEVEL  TSH  T4, FREE  MAGNESIUM  URINALYSIS, ROUTINE W REFLEX MICROSCOPIC  BLOOD GAS, VENOUS  RAPID URINE DRUG SCREEN, HOSP PERFORMED  HIV ANTIBODY (ROUTINE TESTING W REFLEX)  CBC  CREATININE, SERUM  BLOOD GAS, ARTERIAL  CBC  BASIC METABOLIC PANEL  TRIGLYCERIDES  I-STAT TROPONIN, ED  I-STAT CG4 LACTIC ACID, ED  I-STAT CG4 LACTIC ACID, ED  I-STAT TROPONIN, ED  I-STAT ARTERIAL BLOOD GAS, ED    EKG EKG Interpretation  Date/Time:  Friday November 28 2018 23:10:34 EST Ventricular Rate:  137 PR Interval:    QRS Duration: 149 QT Interval:  329 QTC Calculation: 497 R Axis:   -56 Text Interpretation:  Sinus tachycardia Probable left atrial enlargement Left bundle branch block Baseline wander in lead(s) III aVF When compared with ECG of EARLIER SAME DATE Left bundle branch block is now present - apparently rate-related Premature ventricular complexes are no longer present Confirmed by Dione Booze ([id number]) on 11/29/2018 12:01:38 AM   Radiology Ct Abdomen Pelvis Wo Contrast  Result Date: 11/28/2018 CLINICAL DATA:  Midsternal chest pain. Shortness of breath. By report the patient has an IV contrast allergy. EXAM: CT  CHEST, ABDOMEN AND PELVIS WITHOUT CONTRAST TECHNIQUE: Multidetector CT imaging of the chest, abdomen and pelvis was performed following the standard protocol without  IV contrast. COMPARISON:  Chest radiograph 11/28/2018 FINDINGS: Factors adversely affecting the sensitivity and specificity of today's exam: 1. Extensive breathing motion artifact. 2. Suboptimal positioning of the arms and hands, with the fingers and hands interlocked in folded across the chest, introducing streak artifact. CT CHEST FINDINGS Cardiovascular: Coronary, aortic arch, and branch vessel atherosclerotic vascular disease. Mediastinum/Nodes: 1.0 cm in short axis right paratracheal lymph node on image 26/3. Lungs/Pleura: Blurred parenchyma due to extensive breathing motion artifact. Presumably the patient was unable to hold his breath. Suspected airway thickening. Musculoskeletal: Lower thoracic spondylosis. CT ABDOMEN PELVIS FINDINGS Hepatobiliary: Unremarkable Pancreas: Unremarkable Spleen: Unremarkable Adrenals/Urinary Tract: Vascular calcification in the right renal hilum. Suspected left peripelvic cysts. Adrenal glands normal. Stomach/Bowel: Unremarkable Vascular/Lymphatic: Aortoiliac atherosclerotic vascular disease. Small scattered bilateral external iliac and inguinal lymph nodes. Reproductive: Amorphous calcifications centrally in the prostate gland. Other: No supplemental non-categorized findings. Musculoskeletal: Disc osteophyte complex at L5-S1. IMPRESSION: 1. Airway thickening is present, suggesting bronchitis or reactive airways disease. 2. Coronary, aortic arch, and branch vessel atherosclerotic vascular disease. Aortic Atherosclerosis (ICD10-I70.0). 3. Borderline enlarged right paratracheal lymph node, nonspecific. 4. Motion artifact and suboptimal arm positioning adversely affect sensitivity and specificity. Electronically Signed   By: Gaylyn RongWalter  Liebkemann M.D.   On: 11/28/2018 19:18   Ct Chest Wo Contrast  Result Date: 11/28/2018 CLINICAL DATA:  Midsternal chest pain. Shortness of breath. By report the patient has an IV contrast allergy. EXAM: CT CHEST, ABDOMEN AND PELVIS WITHOUT CONTRAST  TECHNIQUE: Multidetector CT imaging of the chest, abdomen and pelvis was performed following the standard protocol without IV contrast. COMPARISON:  Chest radiograph 11/28/2018 FINDINGS: Factors adversely affecting the sensitivity and specificity of today's exam: 1. Extensive breathing motion artifact. 2. Suboptimal positioning of the arms and hands, with the fingers and hands interlocked in folded across the chest, introducing streak artifact. CT CHEST FINDINGS Cardiovascular: Coronary, aortic arch, and branch vessel atherosclerotic vascular disease. Mediastinum/Nodes: 1.0 cm in short axis right paratracheal lymph node on image 26/3. Lungs/Pleura: Blurred parenchyma due to extensive breathing motion artifact. Presumably the patient was unable to hold his breath. Suspected airway thickening. Musculoskeletal: Lower thoracic spondylosis. CT ABDOMEN PELVIS FINDINGS Hepatobiliary: Unremarkable Pancreas: Unremarkable Spleen: Unremarkable Adrenals/Urinary Tract: Vascular calcification in the right renal hilum. Suspected left peripelvic cysts. Adrenal glands normal. Stomach/Bowel: Unremarkable Vascular/Lymphatic: Aortoiliac atherosclerotic vascular disease. Small scattered bilateral external iliac and inguinal lymph nodes. Reproductive: Amorphous calcifications centrally in the prostate gland. Other: No supplemental non-categorized findings. Musculoskeletal: Disc osteophyte complex at L5-S1. IMPRESSION: 1. Airway thickening is present, suggesting bronchitis or reactive airways disease. 2. Coronary, aortic arch, and branch vessel atherosclerotic vascular disease. Aortic Atherosclerosis (ICD10-I70.0). 3. Borderline enlarged right paratracheal lymph node, nonspecific. 4. Motion artifact and suboptimal arm positioning adversely affect sensitivity and specificity. Electronically Signed   By: Gaylyn RongWalter  Liebkemann M.D.   On: 11/28/2018 19:18   Dg Chest Portable 1 View  Result Date: 11/28/2018 CLINICAL DATA:  Intubation. EXAM:  PORTABLE CHEST 1 VIEW COMPARISON:  Radiographs and CT earlier this day. FINDINGS: Endotracheal tube tip at the thoracic inlet 7 cm from the carina. Unchanged heart size and mediastinal contours. Bronchial thickening again seen. No new airspace disease, pleural effusion or pneumothorax. Multiple overlying monitoring devices in place. IMPRESSION: 1. Endotracheal tube tip at the thoracic inlet 7 cm from the carina. 2. Unchanged bronchial thickening. Electronically Signed   By: Shawna OrleansMelanie  Sanford M.D.   On: 11/28/2018 22:56   Dg Chest Port 1 View  Result Date: 11/28/2018 CLINICAL DATA:  Chest pain. EXAM: PORTABLE CHEST 1 VIEW COMPARISON:  04/29/2018 FINDINGS: Lungs are adequately inflated as patient is slightly rotated to the right. There is no focal airspace consolidation or effusion. Borderline cardiomegaly. Remainder of the exam is unchanged. IMPRESSION: No active disease. Electronically Signed   By: Elberta Fortisaniel  Boyle M.D.   On: 11/28/2018 17:50    Procedures Procedure Name: Intubation Date/Time: 11/28/2018 10:42 PM Performed by: Antoine PrimasSmith, Axyl Sitzman, MD Pre-anesthesia Checklist: Patient identified, Patient being monitored, Timeout performed, Emergency Drugs available and Suction available Oxygen Delivery Method: Non-rebreather mask Preoxygenation: Pre-oxygenation with 100% oxygen Induction Type: Rapid sequence Laryngoscope Size: 4 and Miller Grade View: Grade I Number of attempts: 1 Airway Equipment and Method: Stylet and Video-laryngoscopy Placement Confirmation: ETT inserted through vocal cords under direct vision Tube secured with: ETT holder      (including critical care time)  Medications Ordered in ED Medications  propofol (DIPRIVAN) 1000 MG/100ML infusion (has no administration in time range)  fentaNYL 2500mcg in NS 250mL (8410mcg/ml) infusion-PREMIX (40 mcg/hr Intravenous Rate/Dose Change 11/28/18 2324)  fentaNYL (SUBLIMAZE) injection 50 mcg (has no administration in time range)  enoxaparin  (LOVENOX) injection 40 mg (has no administration in time range)  famotidine (PEPCID) IVPB 20 mg premix (has no administration in time range)  fentaNYL (SUBLIMAZE) injection 100 mcg (has no administration in time range)  fentaNYL (SUBLIMAZE) injection 100 mcg (has no administration in time range)  propofol (DIPRIVAN) 1000 MG/100ML infusion (10 mcg/kg/min  104.3 kg Intravenous Rate/Dose Change 11/28/18 2300)  oseltamivir (TAMIFLU) capsule 75 mg (75 mg Oral Not Given 11/28/18 2350)  aspirin EC tablet 81 mg (has no administration in time range)  atorvastatin (LIPITOR) tablet 40 mg (has no administration in time range)  insulin aspart (novoLOG) injection 2-6 Units (has no administration in time range)  perflutren lipid microspheres (DEFINITY) IV suspension (4 mLs Intravenous Given 11/29/18 0031)  PERFLUTREN LIPID MICROSPHERE injection SUSP (has no administration in time range)  lactated ringers bolus 1,000 mL (0 mLs Intravenous Stopped 11/28/18 2203)  vancomycin (VANCOCIN) 2,000 mg in sodium chloride 0.9 % 500 mL IVPB (0 mg Intravenous Stopped 11/28/18 2203)  piperacillin-tazobactam (ZOSYN) IVPB 3.375 g (0 g Intravenous Stopped 11/28/18 1834)  acetaminophen (TYLENOL) tablet 1,000 mg (1,000 mg Oral Given 11/28/18 1943)  oseltamivir (TAMIFLU) capsule 75 mg (75 mg Oral Given 11/28/18 2207)  etomidate (AMIDATE) injection (20 mg Intravenous Given 11/28/18 2227)  rocuronium (ZEMURON) injection (100 mg Intravenous Given 11/28/18 2238)     Initial Impression / Assessment and Plan / ED Course  I have reviewed the triage vital signs and the nursing notes.  Pertinent labs & imaging results that were available during my care of the patient were reviewed by me and considered in my medical decision making (see chart for details).     Patient is a 63-year-old male who presents with above-stated history exam.  On presentation patient is noted to be tachycardic to the 130s, normotensive, tachypneic to the 140s, febrile to 103.2,  with SPO2 of 90% on 6 L.  Initial ECG did not show STEMI but did show tachycardia with poor baseline.  There are abnormal lateral Q waves and PVCs.  Initial troponin is 0.03.  BNP of 113.  BMP shows NA 131, K4.9, CL 96, glucose 317, anion gap of 11.  CBC shows WBC of 6.8, hemoglobin 17.2, platelets 163.  RVP is positive for influenza  A.  Hepatic function panel is WNL.  Ammonia 55.  Procalcitonin 0.11.  VBG shows a pH of 7.4, PCO2 of 38.3, bicarb of 24.7.  Lactic acid 1.53.  Salicylates undetectable.  Acetaminophen undetectable.  TSH is 0.374.  Free T4 0.87.  Mag 1.7.  CT C/A/P 1. Airway thickening is present, suggesting bronchitis or reactive airways disease. 2. Coronary, aortic arch, and branch vessel atherosclerotic vascular disease. Aortic Atherosclerosis (ICD10-I70.0). 3. Borderline enlarged right paratracheal lymph node, nonspecific. 4. Motion artifact and suboptimal arm positioning adversely affect sensitivity and specificity.  On multiple reassessments patient was noted to be satting in the high 80s or low 90s on 8 L.  He was intubated for hypoxic respiratory failure.  Please see above procedure note for details.  Post intubation chest x-ray showed the ET tube was in the trachea.  Final Clinical Impressions(s) / ED Diagnoses   Final diagnoses:  Chest pain, unspecified type  Fever, unspecified  Tachycardia  Acute respiratory failure with hypoxia (HCC)  Influenza A virus present  Hyponatremia   ED Discharge Orders    None       Antoine Primas, MD 11/29/18 1610    Blane Ohara, MD 11/29/18 0045

## 2018-11-28 NOTE — ED Notes (Signed)
Port CXR at bedside 

## 2018-11-28 NOTE — ED Notes (Signed)
Pt remains tachypnea resp. 38.  02 sats 91% on 02 via [place] at 5 LPM.  Dr. Jodi Mourning made aware.  Family remains at bedside

## 2018-11-28 NOTE — ED Notes (Signed)
Pt c/o "bad indigestion" at this time.  Dr. Michiel Sites made aware

## 2018-11-28 NOTE — ED Notes (Signed)
Pt CBG 394. Notified kim, Charity fundraiserN.

## 2018-11-29 ENCOUNTER — Other Ambulatory Visit: Payer: Self-pay

## 2018-11-29 ENCOUNTER — Inpatient Hospital Stay (HOSPITAL_COMMUNITY): Payer: Medicaid Other

## 2018-11-29 DIAGNOSIS — A419 Sepsis, unspecified organism: Principal | ICD-10-CM

## 2018-11-29 DIAGNOSIS — N179 Acute kidney failure, unspecified: Secondary | ICD-10-CM

## 2018-11-29 DIAGNOSIS — R6521 Severe sepsis with septic shock: Secondary | ICD-10-CM

## 2018-11-29 DIAGNOSIS — R0602 Shortness of breath: Secondary | ICD-10-CM

## 2018-11-29 DIAGNOSIS — Z9911 Dependence on respirator [ventilator] status: Secondary | ICD-10-CM

## 2018-11-29 DIAGNOSIS — J9690 Respiratory failure, unspecified, unspecified whether with hypoxia or hypercapnia: Secondary | ICD-10-CM

## 2018-11-29 LAB — BASIC METABOLIC PANEL
Anion gap: 12 (ref 5–15)
Anion gap: 15 (ref 5–15)
BUN: 23 mg/dL — ABNORMAL HIGH (ref 6–20)
BUN: 41 mg/dL — ABNORMAL HIGH (ref 6–20)
CHLORIDE: 104 mmol/L (ref 98–111)
CO2: 16 mmol/L — ABNORMAL LOW (ref 22–32)
CO2: 20 mmol/L — ABNORMAL LOW (ref 22–32)
Calcium: 8.5 mg/dL — ABNORMAL LOW (ref 8.9–10.3)
Calcium: 8.6 mg/dL — ABNORMAL LOW (ref 8.9–10.3)
Chloride: 99 mmol/L (ref 98–111)
Creatinine, Ser: 1.33 mg/dL — ABNORMAL HIGH (ref 0.61–1.24)
Creatinine, Ser: 2.52 mg/dL — ABNORMAL HIGH (ref 0.61–1.24)
GFR calc Af Amer: 31 mL/min — ABNORMAL LOW (ref >60)
GFR calc non Af Amer: 27 mL/min — ABNORMAL LOW (ref >60)
GFR calc non Af Amer: 58 mL/min — ABNORMAL LOW (ref >60)
Glucose, Bld: 454 mg/dL — ABNORMAL HIGH (ref 70–99)
Glucose, Bld: 98 mg/dL (ref 70–99)
Potassium: 4.6 mmol/L (ref 3.5–5.1)
Potassium: 5 mmol/L (ref 3.5–5.1)
Sodium: 130 mmol/L — ABNORMAL LOW (ref 135–145)
Sodium: 136 mmol/L (ref 135–145)

## 2018-11-29 LAB — GLUCOSE, CAPILLARY
GLUCOSE-CAPILLARY: 531 mg/dL — AB (ref 70–99)
GLUCOSE-CAPILLARY: 555 mg/dL — AB (ref 70–99)
GLUCOSE-CAPILLARY: 246 mg/dL — AB (ref 70–99)
GLUCOSE-CAPILLARY: 194 mg/dL — AB (ref 70–99)
GLUCOSE-CAPILLARY: 120 mg/dL — AB (ref 70–99)
Glucose-Capillary: 100 mg/dL — ABNORMAL HIGH (ref 70–99)
Glucose-Capillary: 110 mg/dL — ABNORMAL HIGH (ref 70–99)
Glucose-Capillary: 140 mg/dL — ABNORMAL HIGH (ref 70–99)
Glucose-Capillary: 165 mg/dL — ABNORMAL HIGH (ref 70–99)
Glucose-Capillary: 292 mg/dL — ABNORMAL HIGH (ref 70–99)
Glucose-Capillary: 367 mg/dL — ABNORMAL HIGH (ref 70–99)
Glucose-Capillary: 404 mg/dL — ABNORMAL HIGH (ref 70–99)
Glucose-Capillary: 442 mg/dL — ABNORMAL HIGH (ref 70–99)
Glucose-Capillary: 448 mg/dL — ABNORMAL HIGH (ref 70–99)
Glucose-Capillary: 454 mg/dL — ABNORMAL HIGH (ref 70–99)
Glucose-Capillary: 492 mg/dL — ABNORMAL HIGH (ref 70–99)
Glucose-Capillary: 524 mg/dL (ref 70–99)
Glucose-Capillary: 529 mg/dL (ref 70–99)
Glucose-Capillary: 547 mg/dL (ref 70–99)
Glucose-Capillary: 572 mg/dL (ref 70–99)
Glucose-Capillary: 580 mg/dL (ref 70–99)
Glucose-Capillary: 92 mg/dL (ref 70–99)
Glucose-Capillary: >600 mg/dL (ref 70–99)

## 2018-11-29 LAB — MRSA PCR SCREENING: MRSA BY PCR: NEGATIVE

## 2018-11-29 LAB — URINALYSIS, ROUTINE W REFLEX MICROSCOPIC
BILIRUBIN URINE: NEGATIVE
Bacteria, UA: NONE SEEN
Ketones, ur: 5 mg/dL — AB
Leukocytes, UA: NEGATIVE
Nitrite: NEGATIVE
Protein, ur: 100 mg/dL — AB
SPECIFIC GRAVITY, URINE: 1.024 (ref 1.005–1.030)
pH: 5 (ref 5.0–8.0)

## 2018-11-29 LAB — POCT I-STAT 3, ART BLOOD GAS (G3+)
Acid-base deficit: 7 mmol/L — ABNORMAL HIGH (ref 0.0–2.0)
Acid-base deficit: 8 mmol/L — ABNORMAL HIGH (ref 0.0–2.0)
Bicarbonate: 19.1 mmol/L — ABNORMAL LOW (ref 20.0–28.0)
Bicarbonate: 22.5 mmol/L (ref 20.0–28.0)
O2 Saturation: 100 %
O2 Saturation: 99 %
PH ART: 7.124 — AB (ref 7.350–7.450)
Patient temperature: 101.8
Patient temperature: 103
TCO2: 20 mmol/L — ABNORMAL LOW (ref 22–32)
TCO2: 24 mmol/L (ref 22–32)
pCO2 arterial: 45.1 mmHg (ref 32.0–48.0)
pCO2 arterial: 70.3 mmHg (ref 32.0–48.0)
pH, Arterial: 7.247 — ABNORMAL LOW (ref 7.350–7.450)
pO2, Arterial: 223 mmHg — ABNORMAL HIGH (ref 83.0–108.0)
pO2, Arterial: 292 mmHg — ABNORMAL HIGH (ref 83.0–108.0)

## 2018-11-29 LAB — GLUCOSE, RANDOM: Glucose, Bld: 572 mg/dL (ref 70–99)

## 2018-11-29 LAB — PHOSPHORUS: Phosphorus: 5.6 mg/dL — ABNORMAL HIGH (ref 2.5–4.6)

## 2018-11-29 LAB — CBC
HCT: 51.8 % (ref 39.0–52.0)
HCT: 51.6 % (ref 39.0–52.0)
HEMOGLOBIN: 17.1 g/dL — AB (ref 13.0–17.0)
Hemoglobin: 17.2 g/dL — ABNORMAL HIGH (ref 13.0–17.0)
MCH: 29.9 pg (ref 26.0–34.0)
MCH: 29.5 pg (ref 26.0–34.0)
MCHC: 33 g/dL (ref 30.0–36.0)
MCHC: 33.3 g/dL (ref 30.0–36.0)
MCV: 88.5 fL (ref 80.0–100.0)
MCV: 90.6 fL (ref 80.0–100.0)
PLATELETS: 231 10*3/uL (ref 150–400)
Platelets: 236 10*3/uL (ref 150–400)
RBC: 5.72 MIL/uL (ref 4.22–5.81)
RBC: 5.83 MIL/uL — ABNORMAL HIGH (ref 4.22–5.81)
RDW: 13.2 % (ref 11.5–15.5)
RDW: 13 % (ref 11.5–15.5)
WBC: 10.7 10*3/uL — ABNORMAL HIGH (ref 4.0–10.5)
WBC: 11.8 10*3/uL — ABNORMAL HIGH (ref 4.0–10.5)
nRBC: 0 % (ref 0.0–0.2)
nRBC: 0 % (ref 0.0–0.2)

## 2018-11-29 LAB — RAPID URINE DRUG SCREEN, HOSP PERFORMED
Amphetamines: NOT DETECTED
Barbiturates: NOT DETECTED
Benzodiazepines: NOT DETECTED
Cocaine: NOT DETECTED
Opiates: NOT DETECTED
Tetrahydrocannabinol: NOT DETECTED

## 2018-11-29 LAB — CREATININE, SERUM
Creatinine, Ser: 1.6 mg/dL — ABNORMAL HIGH (ref 0.61–1.24)
GFR calc Af Amer: 53 mL/min — ABNORMAL LOW (ref >60)
GFR calc non Af Amer: 46 mL/min — ABNORMAL LOW (ref >60)

## 2018-11-29 LAB — LACTIC ACID, PLASMA: Lactic Acid, Venous: 2.8 mmol/L (ref 0.5–1.9)

## 2018-11-29 LAB — TROPONIN I: Troponin I: 0.23 ng/mL (ref <0.03)

## 2018-11-29 LAB — MAGNESIUM: Magnesium: 2.7 mg/dL — ABNORMAL HIGH (ref 1.7–2.4)

## 2018-11-29 LAB — HIV ANTIBODY (ROUTINE TESTING W REFLEX): HIV Screen 4th Generation wRfx: NONREACTIVE

## 2018-11-29 LAB — D-DIMER, QUANTITATIVE: D-Dimer, Quant: 13.12 ug/mL-FEU — ABNORMAL HIGH (ref 0.00–0.50)

## 2018-11-29 LAB — TRIGLYCERIDES: Triglycerides: 1648 mg/dL — ABNORMAL HIGH (ref <150)

## 2018-11-29 MED ORDER — DEXTROSE-NACL 5-0.9 % IV SOLN
INTRAVENOUS | Status: DC
  Administered 2018-11-29 – 2018-11-30 (×2): via INTRAVENOUS

## 2018-11-29 MED ORDER — INSULIN REGULAR(HUMAN) IN NACL 100-0.9 UT/100ML-% IV SOLN
INTRAVENOUS | Status: DC
  Administered 2018-11-29: 3.9 [IU]/h via INTRAVENOUS
  Administered 2018-11-29: 1.9 [IU]/h via INTRAVENOUS
  Administered 2018-11-29: 19.1 [IU]/h via INTRAVENOUS
  Filled 2018-11-29 (×4): qty 100

## 2018-11-29 MED ORDER — PERFLUTREN LIPID MICROSPHERE
INTRAVENOUS | Status: AC
  Administered 2018-11-29: 4 mL via INTRAVENOUS
  Filled 2018-11-29: qty 10

## 2018-11-29 MED ORDER — OSELTAMIVIR PHOSPHATE 6 MG/ML PO SUSR
75.0000 mg | Freq: Two times a day (BID) | ORAL | Status: DC
  Administered 2018-11-29 – 2018-11-30 (×3): 75 mg
  Filled 2018-11-29 (×3): qty 12.5

## 2018-11-29 MED ORDER — PERFLUTREN LIPID MICROSPHERE
1.0000 mL | INTRAVENOUS | Status: AC | PRN
  Administered 2018-11-29: 4 mL via INTRAVENOUS
  Filled 2018-11-29: qty 10

## 2018-11-29 MED ORDER — NOREPINEPHRINE BITARTRATE 1 MG/ML IV SOLN
0.0000 ug/min | INTRAVENOUS | Status: DC
  Administered 2018-11-29: 15 ug/min via INTRAVENOUS
  Administered 2018-11-29: 2 ug/min via INTRAVENOUS
  Administered 2018-11-30: 12 ug/min via INTRAVENOUS
  Administered 2018-11-30: 15 ug/min via INTRAVENOUS
  Administered 2018-11-30: 12 ug/min via INTRAVENOUS
  Administered 2018-11-30: 13 ug/min via INTRAVENOUS
  Administered 2018-12-01: 6 ug/min via INTRAVENOUS
  Filled 2018-11-29 (×9): qty 4

## 2018-11-29 MED ORDER — SODIUM CHLORIDE 0.9 % IV SOLN
2.0000 g | Freq: Two times a day (BID) | INTRAVENOUS | Status: DC
  Administered 2018-11-29 – 2018-11-30 (×2): 2 g via INTRAVENOUS
  Filled 2018-11-29 (×3): qty 2

## 2018-11-29 MED ORDER — PERFLUTREN LIPID MICROSPHERE
INTRAVENOUS | Status: AC
  Filled 2018-11-29: qty 10

## 2018-11-29 MED ORDER — CHLORHEXIDINE GLUCONATE 0.12% ORAL RINSE (MEDLINE KIT)
15.0000 mL | Freq: Two times a day (BID) | OROMUCOSAL | Status: DC
  Administered 2018-11-29 – 2018-12-03 (×10): 15 mL via OROMUCOSAL

## 2018-11-29 MED ORDER — DEXMEDETOMIDINE HCL IN NACL 200 MCG/50ML IV SOLN: 0.0000 ug/kg/h | INTRAVENOUS | Status: DC

## 2018-11-29 MED ORDER — ORAL CARE MOUTH RINSE
15.0000 mL | OROMUCOSAL | Status: DC
  Administered 2018-11-29 – 2018-12-03 (×44): 15 mL via OROMUCOSAL

## 2018-11-29 MED ORDER — MIDAZOLAM HCL 2 MG/2ML IJ SOLN
2.0000 mg | INTRAMUSCULAR | Status: DC | PRN
  Administered 2018-11-29 – 2018-11-30 (×5): 2 mg via INTRAVENOUS
  Filled 2018-11-29 (×6): qty 2

## 2018-11-29 MED ORDER — ASPIRIN 81 MG PO CHEW
81.0000 mg | CHEWABLE_TABLET | Freq: Every day | ORAL | Status: DC
  Administered 2018-11-29 – 2019-02-20 (×83): 81 mg
  Filled 2018-11-29 (×84): qty 1

## 2018-11-29 MED ORDER — ACETAMINOPHEN 160 MG/5ML PO SOLN
500.0000 mg | Freq: Four times a day (QID) | ORAL | Status: DC | PRN
  Administered 2018-11-29 – 2019-01-01 (×24): 500 mg
  Filled 2018-11-29 (×25): qty 20.3

## 2018-11-29 MED ORDER — SODIUM CHLORIDE 0.9 % IV BOLUS
250.0000 mL | Freq: Once | INTRAVENOUS | Status: AC
  Administered 2018-11-29: 250 mL via INTRAVENOUS

## 2018-11-29 MED ORDER — DOCUSATE SODIUM 50 MG/5ML PO LIQD
100.0000 mg | Freq: Two times a day (BID) | ORAL | Status: DC | PRN
  Administered 2018-12-08 – 2019-01-31 (×4): 100 mg
  Filled 2018-11-29 (×4): qty 10

## 2018-11-29 MED ORDER — MAGNESIUM SULFATE IN D5W 1-5 GM/100ML-% IV SOLN
1.0000 g | Freq: Once | INTRAVENOUS | Status: AC
  Administered 2018-11-29: 1 g via INTRAVENOUS
  Filled 2018-11-29: qty 100

## 2018-11-29 MED ORDER — IBUPROFEN 100 MG/5ML PO SUSP
600.0000 mg | Freq: Once | ORAL | Status: AC
  Administered 2018-11-29: 600 mg
  Filled 2018-11-29: qty 30

## 2018-11-29 NOTE — Progress Notes (Signed)
NAME:  Antonio [name]., MRN:  [id number], DOB:  1956/05/30, LOS: 1 ADMISSION DATE:  11/28/2018, CONSULTATION DATE:  11/28/18 REFERRING MD:  Dr. Katrinka Blazing, CHIEF COMPLAINT: Respiratory distress   Brief History   63 y/o M who presented to [facility] 1/3 with fever, cough, chest pain & SOB.  He was seen recently at an UC to have a "boil lanced".  Then became sick after the visit.  Initially was being worked up as chest pain, r/o NSTEMI.  Then found to be Influenza A positive. Developed progressive respiratory distress requiring intubation in the ER.  Elevated glucose requiring insulin gtt.   Past Medical History  HTN - poorly controlled, non-compliant as outpatient  HLD DM  CAD  Osteomyelitis s/p L BKA, R great toe amputation    Significant Hospital Events   01/03  Admit with influenza A, resp failure   Consults:  PCCM   Procedures:  ETT 1/3 >>   Significant Diagnostic Tests:  UDS 1/4 >> negative  ECHO 1/4 >> LVEF 20-25%, akinesis of the anteroseptal, anterolateral & apical myocardium, LVEF 20-25%, grade 1 diastolic dysfunction, small pericardial effusion without evidence of hemodynamic compromise CT Chest / ABD / Pelvis 1/3 >> airway thickening, motion artifact, CAD  Micro Data:  RVP 1/3 >> positive for influenza A BCx2 1/3 >>  UC 1/4 >>   Antimicrobials:  Vanco 1/3 x1 Zosyn 1/30 x1  Tamiflu 1/3 >>   Interim history/subjective:  RN reports ongoing hyperglycemia.  Remains on vent.  Concern for elevated triglycerides.    Objective   Blood pressure (!) 89/60, pulse (!) 105, temperature (!) 102.2 F (39 C), temperature source Axillary, resp. rate (!) 28, height 6' (1.829 m), weight 104.5 kg, SpO2 96 %.    Vent Mode: PRVC FiO2 (%):  [50 %-100 %] 50 % Set Rate:  [18 bmp-28 bmp] 28 bmp Vt Set:  [620 mL] 620 mL PEEP:  [5 cmH20-8 cmH20] 8 cmH20 Plateau Pressure:  [21 cmH20-29 cmH20] 22 cmH20   Intake/Output Summary (Last 24 hours) at 11/29/2018 1213 Last data filed at 11/29/2018  1100 Gross per 24 hour  Intake 2044.35 ml  Output 800 ml  Net 1244.35 ml   Filed Weights   11/28/18 1733 11/29/18 0010 11/29/18 0500  Weight: 104.3 kg 104 kg 104.5 kg    Examination: General: adult male lying in bed on vent, diaphoretic   HEENT: MM pink/moist, ETT Neuro: sedate, pupils 85mm sluggish, spontaneous movement noted  CV: s1s2 rrr, no m/r/g PULM: even/non-labored, lungs bilaterally clear  ML:JQGB, non-tender, bsx4 active  Extremities: warm/dry, no edema, L BKA, right great toe amputation  Skin: no rashes.  Small (~quarter sized) area on right buttock that appears like a ruptured blister.  No exudate or erythema.    Resolved Hospital Problem list      Assessment & Plan:   Acute Hypoxemic Respiratory Failure secondary to Influenza A P: PRVC 8 cc/kg  Wean PEEP / FiO2 for sats >90% Follow CXR intermittently  Continue tamiflu  SBT when able, currently O2 needs do not meet criteria for weaning   DM / Hyperglycemia / Mild DKA  -poorly controlled  -5 ketones in urine  P: Insulin gtt  Trend CBG's per protocol   Mild Hypotension / SIRS -MAP >65 but SBP running 80-90's  -suspect fever / SIRS response contributing  P: 250 ml NS bolus over 2 hours  Monitor BP trend  May need levophed for MAP >65   Hypertriglyceridemia  P: Stop propofol  Follow up triglycerides 1/6   Hyperkalemia  At Risk AKI  AGMA P: Trend BMP / urinary output Replace electrolytes as indicated Avoid nephrotoxic agents, ensure adequate renal perfusion  Hx HTN, HLD, CAD  P: Hold home antihypertensives  ICU monitoring   At Risk Malnutrition  P: Consider TF in am 1/5   Pain / Agitation  P: Fentanyl gtt for pain PRN versed for sedation    Best practice:  Diet: NPO  Pain/Anxiety/Delirium protocol (if indicated): fentanyl gtt, PRN versed  VAP protocol (if indicated): in place  DVT prophylaxis: lovenox  GI prophylaxis: Pepcid  Glucose control: Insulin gtt  Mobility: bed rest   Code Status: Full code  Family Communication: Family updated at bedside 1/4 on NP rounds  Disposition: ICU   Labs   CBC: Recent Labs  Lab 11/28/18 1658 11/28/18 1719 11/28/18 2037 11/29/18 0042 11/29/18 0332  WBC 6.8  --   --  10.7* 11.8*  HGB 17.2* 18.4* 15.0 17.1* 17.2*  HCT 52.4* 54.0* 44.0 51.8 51.6  MCV 88.2  --   --  90.6 88.5  PLT 163  --   --  231 236    Basic Metabolic Panel: Recent Labs  Lab 11/28/18 1658 11/28/18 1719 11/28/18 2013 11/28/18 2037 11/29/18 0042 11/29/18 0332 11/29/18 0717  NA 131* 132*  --  131* 130*  --   --   K 4.9 5.0  --  5.0 5.0  --   --   CL 96* 99  --  100 99  --   --   CO2 24  --   --   --  16*  --   --   GLUCOSE 317* 320*  --  341* 454*  --  572*  BUN 13 20  --  24* 23*  --   --   CREATININE 1.09 1.00  --  1.00 1.33* 1.60*  --   CALCIUM 9.4  --   --   --  8.5*  --   --   MG  --   --  1.7  --   --   --   --    GFR: Estimated Creatinine Clearance: 61.4 mL/min (A) (by C-G formula based on SCr of 1.6 mg/dL (H)). Recent Labs  Lab 11/28/18 1658 11/28/18 1725 11/28/18 1759 11/28/18 2036 11/29/18 0042 11/29/18 0332  PROCALCITON  --  0.11  --   --   --   --   WBC 6.8  --   --   --  10.7* 11.8*  LATICACIDVEN  --   --  1.53 1.51  --  2.8*    Liver Function Tests: Recent Labs  Lab 11/28/18 1725  AST 28  ALT 23  ALKPHOS 84  BILITOT 0.7  PROT 7.2  ALBUMIN 3.7   No results for input(s): LIPASE, AMYLASE in the last 168 hours. Recent Labs  Lab 11/28/18 1725  AMMONIA 55*    ABG    Component Value Date/Time   PHART 7.247 (L) 11/29/2018 0211   PCO2ART 45.1 11/29/2018 0211   PO2ART 292.0 (H) 11/29/2018 0211   HCO3 19.1 (L) 11/29/2018 0211   TCO2 20 (L) 11/29/2018 0211   ACIDBASEDEF 7.0 (H) 11/29/2018 0211   O2SAT 100.0 11/29/2018 0211     Coagulation Profile: Recent Labs  Lab 11/28/18 1658  INR 0.97    Cardiac Enzymes: Recent Labs  Lab 11/29/18 0042  TROPONINI 0.23*    HbA1C: Hgb A1c MFr Bld   Date/Time Value Ref Range  Status  03/25/2017 05:59 AM 12.3 (H) 4.8 - 5.6 % Final    Comment:    (NOTE)         Pre-diabetes: 5.7 - 6.4         Diabetes: >6.4         Glycemic control for adults with diabetes: <7.0   12/28/2015 08:50 PM 11.7 (H) 4.8 - 5.6 % Final    Comment:    (NOTE)         Pre-diabetes: 5.7 - 6.4         Diabetes: >6.4         Glycemic control for adults with diabetes: <7.0     CBG: Recent Labs  Lab 11/29/18 0751 11/29/18 0825 11/29/18 0932 11/29/18 1037 11/29/18 1138  GLUCAP >600* 572* 529* 524* 454*    Critical care time: 32 minutes      Canary Brim, NP-C [facility] Pgr: [phone] or if no answer [phone] 11/29/2018, 12:13 PM

## 2018-11-29 NOTE — Progress Notes (Signed)
   Continue with current supportive care for respiratory infection, influenza A.  Echocardiogram 11/29/2018: - Left ventricle: The cavity size was normal. Wall thickness was   normal. Systolic function was severely reduced. The estimated   ejection fraction was in the range of 20% to 25%. There is   akinesis of the anteroseptal, anterolateral, and apical   myocardium. Doppler parameters are consistent with abnormal left   ventricular relaxation (grade 1 diastolic dysfunction). - Pericardium, extracardiac: A small pericardial effusion was   identified circumferential to the heart. The fluid exhibited a   fibrinous appearance.There was no evidence of hemodynamic   compromise.  No new recs at this time.   Donato Schultz, MD

## 2018-11-29 NOTE — Progress Notes (Signed)
Patient transported to 4N29 with no complications. Vitals stable.

## 2018-11-29 NOTE — Progress Notes (Addendum)
eLink Physician-Brief Progress Note Patient Name: Antonio [name]. DOB: June 04, 1956 MRN: [id number]   Date of Service  11/29/2018  HPI/Events of Note  Accepted from ED. Discussed with MD.  63 yr old male admitted for Sepsis from Influenza A, Type 2 resp failure, on Ventilator now, Low EF on TTE, LBBB, neg troponin , Hyperglycemia.  Camera eval done Discussed with bed side RN Data reviewed.   eICU Interventions  Vent setting changed Follow ABG Insulin protocol for BG goal < 180 Follow troponin, LA.  Get a d dimer. Due to low EF, will defer fluids and LA x 2 was normal.  On prophylaxis  1:37 AM Cardiology note reviewed. Cr 1. Ordered d dimer also. Possible viral myocarditis?Marland Kitchen      Intervention Category Major Interventions: Respiratory failure - evaluation and management;Sepsis - evaluation and management Evaluation Type: New Patient Evaluation  Antonio [name] 11/29/2018, 1:06 AM

## 2018-11-29 NOTE — Progress Notes (Signed)
Critical ABG results called in to the box to K. Marlane Mingle, MD. Awaiting orders.

## 2018-11-29 NOTE — Consult Note (Signed)
Cardiology Consult    Patient ID: Antonio [name]. MRN: [id number], DOB/AGE: 61-Jan-1957   Admit date: 11/28/2018 Date of Consult: 11/29/2018  Primary Physician: Dema Severin, NP Primary Cardiologist:Hochrein Requesting Provider: Antoine Primas  Patient Profile    Antonio [name]. is a 63 y.o. male with a history of CAD, DM who presented with SOB   Past Medical History   Past Medical History:  Diagnosis Date  . Coronary artery disease   . Diabetes mellitus without complication (HCC)   . Hyperlipidemia   . Hypertension   . Osteomyelitis (HCC) 03/25/2017   RT FOOT    Past Surgical History:  Procedure Laterality Date  . AMPUTATION Right 03/27/2017   Procedure: 1st Ray Amputation Right Foot;  Surgeon: Nadara Mustard, MD;  Location: [facility] OR;  Service: Orthopedics;  Laterality: Right;  . APPENDECTOMY    . CARDIAC CATHETERIZATION    . CORONARY STENT INTERVENTION  2005  . Great toe amputation right.    . I&D EXTREMITY Left 12/30/2015   Procedure: IRRIGATION AND DEBRIDEMENT LEFT FOOT, TRANSMETATARSAL AMPUTATION WITH APPLICATION OF ANTIBIOTIC BEADS AND WOUND VAC;  Surgeon: Nadara Mustard, MD;  Location: [facility] OR;  Service: Orthopedics;  Laterality: Left;  . TONSILLECTOMY       Allergies  Allergies  Allergen Reactions  . Iodinated Diagnostic Agents Hives, Rash and Other (See Comments)    Blisters Staph Blisters / staph  . Penicillins Other (See Comments)    UNSPECIFIED REACTION  Has patient had a PCN reaction causing immediate rash, facial/tongue/throat swelling, SOB or lightheadedness with hypotension: No Has patient had a PCN reaction causing severe rash involving mucus membranes or skin necrosis: No Has patient had a PCN reaction that required hospitalization: No Has patient had a PCN reaction occurring within the last 10 years: No If all of the above answers are "NO", then may proceed with Cephalosporin use.    History of Present Illness    Mr. Coelho is a  63 year old male with history of diabetes mellitus, CAD, PAD and hypertension coming in with 1 day history of shortness of breath and chest pain.  As per his son patient was started to get confused today and he was complaining of chest pain and shortness of breath.  Patient was brought into the emergency room and was thought that he was having a STEMI but this was ruled out and he was positive for influenza A. With high grade fevers to 104.   Hospitalist were called but then while he was in the emergency room patient started becoming more tachypneic and desaturating down to the 80s on 5 L nasal cannula so we were consulted.  Was eventually intubated and sent to ICU. IN the ED prior to departure had reportedly an intermittent HR in the 40's. ECG with some ectopy and new intermitted LBBB.   He had a nuclear stress in 2019 with LVEF of 40%. PCI in 2005.  Did not have a flu shot this year Recent LEE amputation   Inpatient Medications    . aspirin EC  81 mg Oral Daily  . atorvastatin  40 mg Oral q1800  . enoxaparin (LOVENOX) injection  40 mg Subcutaneous Daily  . fentaNYL (SUBLIMAZE) injection  50 mcg Intravenous Once  . insulin aspart  2-6 Units Subcutaneous Q4H  . oseltamivir  75 mg Oral BID  . perflutren lipid microspheres (DEFINITY) IV suspension        Family History    Family History  Problem Relation Age of Onset  . Diabetes Mother   . Emphysema Father   . CAD Sister 1       Died of MI after flu   He indicated that his mother is alive. He indicated that his father is deceased. He indicated that his sister is deceased.   Social History    Social History   Socioeconomic History  . Marital status: Divorced    Spouse name: Not on file  . Number of children: Not on file  . Years of education: Not on file  . Highest education level: Not on file  Occupational History  . Not on file  Social Needs  . Financial resource strain: Not on file  . Food insecurity:    Worry: Not on  file    Inability: Not on file  . Transportation needs:    Medical: Not on file    Non-medical: Not on file  Tobacco Use  . Smoking status: Current Every Day Smoker    Packs/day: 2.00    Years: 35.00    Pack years: 70.00    Types: Cigarettes  . Smokeless tobacco: Never Used  Substance and Sexual Activity  . Alcohol use: Yes    Comment: beers occasionally  . Drug use: No  . Sexual activity: Not on file  Lifestyle  . Physical activity:    Days per week: Not on file    Minutes per session: Not on file  . Stress: Not on file  Relationships  . Social connections:    Talks on phone: Not on file    Gets together: Not on file    Attends religious service: Not on file    Active member of club or organization: Not on file    Attends meetings of clubs or organizations: Not on file    Relationship status: Not on file  . Intimate partner violence:    Fear of current or ex partner: Not on file    Emotionally abused: Not on file    Physically abused: Not on file    Forced sexual activity: Not on file  Other Topics Concern  . Not on file  Social History Narrative   Lives with mother.  He has three children.       Review of Systems    General:  No chills, fever, night sweats or weight changes.  Cardiovascular:  No chest pain, dyspnea on exertion, edema, orthopnea, palpitations, paroxysmal nocturnal dyspnea. Dermatological: No rash, lesions/masses Respiratory: No cough, dyspnea Urologic: No hematuria, dysuria Abdominal:   No nausea, vomiting, diarrhea, bright red blood per rectum, melena, or hematemesis Neurologic:  No visual changes, wkns, changes in mental status. All other systems reviewed and are otherwise negative except as noted above.  Physical Exam    Blood pressure (!) 187/115, pulse (!) 138, temperature (!) 103.2 F (39.6 C), resp. rate (!) 22, height 6' (1.829 m), weight 104.3 kg, SpO2 96 %.  General: INtub and sedated Neuro: Sedated  HEENT: cant eval Neck: Supple  without bruits or JVD. Lungs:  mech breath sounds Heart: RRR no s3, s4, or murmurs. Abdomen: Soft, non-tender, non-distended, BS + x 4.  Extremities: No clubbing, cyanosis or edema. DP/PT/Radials 2+ and equal bilaterally.  Labs    Troponin [facility] of Care Test) Recent Labs    11/28/18 2037  TROPIPOC 0.04   No results for input(s): CKTOTAL, CKMB, TROPONINI in the last 72 hours. Lab Results  Component Value Date   WBC 6.8 11/28/2018  HGB 15.0 11/28/2018   HCT 44.0 11/28/2018   MCV 88.2 11/28/2018   PLT 163 11/28/2018    Recent Labs  Lab 11/28/18 1658  11/28/18 1725 11/28/18 2037  NA 131*   < >  --  131*  K 4.9   < >  --  5.0  CL 96*   < >  --  100  CO2 24  --   --   --   BUN 13   < >  --  24*  CREATININE 1.09   < >  --  1.00  CALCIUM 9.4  --   --   --   PROT  --   --  7.2  --   BILITOT  --   --  0.7  --   ALKPHOS  --   --  84  --   ALT  --   --  23  --   AST  --   --  28  --   GLUCOSE 317*   < >  --  341*   < > = values in this interval not displayed.   No results found for: CHOL, HDL, LDLCALC, TRIG Lab Results  Component Value Date   DDIMER 0.66 (H) 11/28/2018     Radiology Studies    Ct Abdomen Pelvis Wo Contrast  Result Date: 11/28/2018 CLINICAL DATA:  Midsternal chest pain. Shortness of breath. By report the patient has an IV contrast allergy. EXAM: CT CHEST, ABDOMEN AND PELVIS WITHOUT CONTRAST TECHNIQUE: Multidetector CT imaging of the chest, abdomen and pelvis was performed following the standard protocol without IV contrast. COMPARISON:  Chest radiograph 11/28/2018 FINDINGS: Factors adversely affecting the sensitivity and specificity of today's exam: 1. Extensive breathing motion artifact. 2. Suboptimal positioning of the arms and hands, with the fingers and hands interlocked in folded across the chest, introducing streak artifact. CT CHEST FINDINGS Cardiovascular: Coronary, aortic arch, and branch vessel atherosclerotic vascular disease. Mediastinum/Nodes:  1.0 cm in short axis right paratracheal lymph node on image 26/3. Lungs/Pleura: Blurred parenchyma due to extensive breathing motion artifact. Presumably the patient was unable to hold his breath. Suspected airway thickening. Musculoskeletal: Lower thoracic spondylosis. CT ABDOMEN PELVIS FINDINGS Hepatobiliary: Unremarkable Pancreas: Unremarkable Spleen: Unremarkable Adrenals/Urinary Tract: Vascular calcification in the right renal hilum. Suspected left peripelvic cysts. Adrenal glands normal. Stomach/Bowel: Unremarkable Vascular/Lymphatic: Aortoiliac atherosclerotic vascular disease. Small scattered bilateral external iliac and inguinal lymph nodes. Reproductive: Amorphous calcifications centrally in the prostate gland. Other: No supplemental non-categorized findings. Musculoskeletal: Disc osteophyte complex at L5-S1. IMPRESSION: 1. Airway thickening is present, suggesting bronchitis or reactive airways disease. 2. Coronary, aortic arch, and branch vessel atherosclerotic vascular disease. Aortic Atherosclerosis (ICD10-I70.0). 3. Borderline enlarged right paratracheal lymph node, nonspecific. 4. Motion artifact and suboptimal arm positioning adversely affect sensitivity and specificity. Electronically Signed   By: Gaylyn RongWalter  Liebkemann M.D.   On: 11/28/2018 19:18   Ct Chest Wo Contrast  Result Date: 11/28/2018 CLINICAL DATA:  Midsternal chest pain. Shortness of breath. By report the patient has an IV contrast allergy. EXAM: CT CHEST, ABDOMEN AND PELVIS WITHOUT CONTRAST TECHNIQUE: Multidetector CT imaging of the chest, abdomen and pelvis was performed following the standard protocol without IV contrast. COMPARISON:  Chest radiograph 11/28/2018 FINDINGS: Factors adversely affecting the sensitivity and specificity of today's exam: 1. Extensive breathing motion artifact. 2. Suboptimal positioning of the arms and hands, with the fingers and hands interlocked in folded across the chest, introducing streak artifact. CT  CHEST FINDINGS Cardiovascular: Coronary, aortic arch, and  branch vessel atherosclerotic vascular disease. Mediastinum/Nodes: 1.0 cm in short axis right paratracheal lymph node on image 26/3. Lungs/Pleura: Blurred parenchyma due to extensive breathing motion artifact. Presumably the patient was unable to hold his breath. Suspected airway thickening. Musculoskeletal: Lower thoracic spondylosis. CT ABDOMEN PELVIS FINDINGS Hepatobiliary: Unremarkable Pancreas: Unremarkable Spleen: Unremarkable Adrenals/Urinary Tract: Vascular calcification in the right renal hilum. Suspected left peripelvic cysts. Adrenal glands normal. Stomach/Bowel: Unremarkable Vascular/Lymphatic: Aortoiliac atherosclerotic vascular disease. Small scattered bilateral external iliac and inguinal lymph nodes. Reproductive: Amorphous calcifications centrally in the prostate gland. Other: No supplemental non-categorized findings. Musculoskeletal: Disc osteophyte complex at L5-S1. IMPRESSION: 1. Airway thickening is present, suggesting bronchitis or reactive airways disease. 2. Coronary, aortic arch, and branch vessel atherosclerotic vascular disease. Aortic Atherosclerosis (ICD10-I70.0). 3. Borderline enlarged right paratracheal lymph node, nonspecific. 4. Motion artifact and suboptimal arm positioning adversely affect sensitivity and specificity. Electronically Signed   By: Gaylyn RongWalter  Liebkemann M.D.   On: 11/28/2018 19:18   Dg Chest Portable 1 View  Result Date: 11/28/2018 CLINICAL DATA:  Intubation. EXAM: PORTABLE CHEST 1 VIEW COMPARISON:  Radiographs and CT earlier this day. FINDINGS: Endotracheal tube tip at the thoracic inlet 7 cm from the carina. Unchanged heart size and mediastinal contours. Bronchial thickening again seen. No new airspace disease, pleural effusion or pneumothorax. Multiple overlying monitoring devices in place. IMPRESSION: 1. Endotracheal tube tip at the thoracic inlet 7 cm from the carina. 2. Unchanged bronchial thickening.  Electronically Signed   By: Narda RutherfordMelanie  Sanford M.D.   On: 11/28/2018 22:56   Dg Chest Port 1 View  Result Date: 11/28/2018 CLINICAL DATA:  Chest pain. EXAM: PORTABLE CHEST 1 VIEW COMPARISON:  04/29/2018 FINDINGS: Lungs are adequately inflated as patient is slightly rotated to the right. There is no focal airspace consolidation or effusion. Borderline cardiomegaly. Remainder of the exam is unchanged. IMPRESSION: No active disease. Electronically Signed   By: Elberta Fortisaniel  Boyle M.D.   On: 11/28/2018 17:50    ECG & Cardiac Imaging    Sinus tach, no evidence of active ischemia   TTE done tonight. Prelim read is a normal RV function and depressed LVEF~20-25%. Likely driven by the fast HR to some degree. Mild pericardial effusion.   Assessment & Plan    Respiratory failure in setting of the flu. ABG with hypercarbic, hypoxia.  Complicated by hypotension, decreased LVEF. Possibly acute worsening of heart function due to viral myocarditis. Trop neg so far.  BNP 113.   -Treatment should focus on pulmonary issue for now. Expect recovery of LVEF to his baseline of 40% should his flu resolve  -Would avoid neurohormonal blocking agents at this time.  -Can trend troponin for 1 more value.   Signed, Macario GoldsMarat Dale Ribeiro, MD 11/29/2018, 12:58 AM  For questions or updates, please contact   Please consult [email or web address] for contact info under Cardiology/STEMI.

## 2018-11-29 NOTE — Progress Notes (Signed)
Elink contacted for critical patient values:  Troponin 0.23 Temp 103 F  ABG values:  PH 7.24 CO2 45.1 O2 292 Bicarb 19.1  Change of plan: will communicate to RT to change vent to 60% with PEEP of 8. Nothing else delegated to nurse.

## 2018-11-29 NOTE — Progress Notes (Signed)
Echocardiogram Complete  Zarya Lasseigne, RDCS 

## 2018-11-29 NOTE — Progress Notes (Signed)
eLink Physician-Brief Progress Note Patient Name: Antonio [name]. DOB: 11-18-1956 MRN: [id number]   Date of Service  11/29/2018  HPI/Events of Note  Patient admitted with DKA on insulin IV infusion. Last pH = 7.247. Also on Norepinephrine IV infusion. No CVL. No follow up labs or ABG since last night. 2. Blood glucose = 95.   eICU Interventions  Will order: 1. ABG, BMP, Mg++ and PO4--- level STAT. 2. D5 0.9 NaCl to run IV at 50 mL/hour.      Intervention Category Major Interventions: Acid-Base disturbance - evaluation and management;Respiratory failure - evaluation and management  Oda Placke Eugene 11/29/2018, 10:04 PM

## 2018-11-29 NOTE — Progress Notes (Signed)
eLink Physician-Brief Progress Note Patient Name: Antonio [name]. DOB: [birth year] MRN: [id number]   Date of Service  11/29/2018  HPI/Events of Note  Fever 102.  eICU Interventions  Tylenol liquid via tube prn ordered.     Intervention Category Minor Interventions: Routine modifications to care plan (e.g. PRN medications for pain, fever)  Ranee Gosselin 11/29/2018, 4:15 AM

## 2018-11-29 NOTE — Progress Notes (Signed)
Initial Nutrition Assessment  DOCUMENTATION CODES:   Obesity unspecified  INTERVENTION:  If unable to extubate, Recommend TF with Vital High Protein at goal rate of 45 ml/h (1080 ml per day) and Prostat 60 ml BID to provide 1480 kcals, 155 gm protein, 907 ml free water daily.  NUTRITION DIAGNOSIS:   Inadequate oral intake related to inability to eat as evidenced by NPO status.  GOAL:   Provide needs based on ASPEN/SCCM guidelines  MONITOR:   Vent status, Labs, Skin, Weight trends, I & O's  REASON FOR ASSESSMENT:   Ventilator, Consult (Assessment of nutrition status and recomendations for tube feeding)  ASSESSMENT:   63 y/o M who presented to [facility] 1/3 with fever, cough, chest pain & SOB. Pt found to be Influenza A positive. Developed progressive respiratory distress requiring intubation in the ER.    Patient is currently intubated on ventilator support MV: 17 L/min Temp (24hrs), Avg:102 F (38.9 C), Min:99.4 F (37.4 C), Max:103.2 F (39.6 C)  Propofol: 17.44 ml/hr which provides 460 kcal/day  Plans to stop propofol as triglycerides lab value elevated at 1648 mg/dL. No family during time of visit. Pt weight no weight loss per weight records.   Labs and medications reviewed.   NUTRITION - FOCUSED PHYSICAL EXAM:    Most Recent Value  Orbital Region  No depletion  Upper Arm Region  No depletion  Thoracic and Lumbar Region  No depletion  Buccal Region  No depletion  Temple Region  No depletion  Clavicle Bone Region  No depletion  Clavicle and Acromion Bone Region  No depletion  Scapular Bone Region  Unable to assess  Dorsal Hand  Unable to assess  Patellar Region  No depletion  Anterior Thigh Region  No depletion  Posterior Calf Region  No depletion  Edema (RD Assessment)  None  Hair  Reviewed  Eyes  Unable to assess  Mouth  Unable to assess  Skin  Reviewed  Nails  Unable to assess       Diet Order:   Diet Order            Diet NPO time specified   Diet effective now              EDUCATION NEEDS:   Not appropriate for education at this time  Skin:  Skin Assessment: Reviewed RN Assessment(L BKA)  Last BM:  Unknown  Height:   Ht Readings from Last 1 Encounters:  11/29/18 6' (1.829 m)    Weight:   Wt Readings from Last 1 Encounters:  11/29/18 104.5 kg    Ideal Body Weight:  76.1 kg(adjusted for L BKA)  BMI:  Body mass index is 31.25 kg/m.  Estimated Nutritional Needs:   Kcal:  8299-3716  Protein:  152-162 grams  Fluid:  >/= 1.5 L/day    Roslyn Smiling, MS, RD, LDN Pager # [phone] After hours/ weekend pager # [phone]

## 2018-11-29 NOTE — Progress Notes (Signed)
eLink Physician-Brief Progress Note Patient Name: Antonio RichardsDaniel Watson Patry Jr. DOB: 1956/03/03 MRN: [id number]   Date of Service  11/29/2018  HPI/Events of Note  ABG: Improving P/F ratio and resp and metabolic acidosis.  Troponin 0.23 , from demand ischemia.   eICU Interventions  Vent fio2 and peep adjusted . Follow ABG and wean Vt to goal .      Intervention Category Major Interventions: Acid-Base disturbance - evaluation and management Intermediate Interventions: Diagnostic test evaluation  Antonio GosselinKinila T Yarel [name] 11/29/2018, 2:23 AM

## 2018-11-30 ENCOUNTER — Inpatient Hospital Stay: Payer: Self-pay

## 2018-11-30 ENCOUNTER — Inpatient Hospital Stay (HOSPITAL_COMMUNITY): Payer: Medicaid Other

## 2018-11-30 DIAGNOSIS — R6510 Systemic inflammatory response syndrome (SIRS) of non-infectious origin without acute organ dysfunction: Secondary | ICD-10-CM

## 2018-11-30 DIAGNOSIS — I5023 Acute on chronic systolic (congestive) heart failure: Secondary | ICD-10-CM

## 2018-11-30 DIAGNOSIS — J111 Influenza due to unidentified influenza virus with other respiratory manifestations: Secondary | ICD-10-CM

## 2018-11-30 DIAGNOSIS — R570 Cardiogenic shock: Secondary | ICD-10-CM

## 2018-11-30 LAB — POCT I-STAT 3, VENOUS BLOOD GAS (G3P V)
Acid-base deficit: 2 mmol/L (ref 0.0–2.0)
Bicarbonate: 23.5 mmol/L (ref 20.0–28.0)
O2 Saturation: 79 %
Patient temperature: 98.7
TCO2: 25 mmol/L (ref 22–32)
pCO2, Ven: 40.1 mmHg — ABNORMAL LOW (ref 44.0–60.0)
pH, Ven: 7.376 (ref 7.250–7.430)
pO2, Ven: 44 mmHg (ref 32.0–45.0)

## 2018-11-30 LAB — CBC
HCT: 52.9 % — ABNORMAL HIGH (ref 39.0–52.0)
Hemoglobin: 17.3 g/dL — ABNORMAL HIGH (ref 13.0–17.0)
MCH: 28.9 pg (ref 26.0–34.0)
MCHC: 32.7 g/dL (ref 30.0–36.0)
MCV: 88.3 fL (ref 80.0–100.0)
NRBC: 0 % (ref 0.0–0.2)
Platelets: 167 10*3/uL (ref 150–400)
RBC: 5.99 MIL/uL — ABNORMAL HIGH (ref 4.22–5.81)
RDW: 13.3 % (ref 11.5–15.5)
WBC: 14.1 10*3/uL — ABNORMAL HIGH (ref 4.0–10.5)

## 2018-11-30 LAB — GLUCOSE, CAPILLARY
GLUCOSE-CAPILLARY: 102 mg/dL — AB (ref 70–99)
GLUCOSE-CAPILLARY: 135 mg/dL — AB (ref 70–99)
GLUCOSE-CAPILLARY: 155 mg/dL — AB (ref 70–99)
GLUCOSE-CAPILLARY: 175 mg/dL — AB (ref 70–99)
Glucose-Capillary: 139 mg/dL — ABNORMAL HIGH (ref 70–99)
Glucose-Capillary: 126 mg/dL — ABNORMAL HIGH (ref 70–99)
Glucose-Capillary: 144 mg/dL — ABNORMAL HIGH (ref 70–99)
Glucose-Capillary: 181 mg/dL — ABNORMAL HIGH (ref 70–99)
Glucose-Capillary: 182 mg/dL — ABNORMAL HIGH (ref 70–99)
Glucose-Capillary: 192 mg/dL — ABNORMAL HIGH (ref 70–99)
Glucose-Capillary: 190 mg/dL — ABNORMAL HIGH (ref 70–99)
Glucose-Capillary: 171 mg/dL — ABNORMAL HIGH (ref 70–99)
Glucose-Capillary: 180 mg/dL — ABNORMAL HIGH (ref 70–99)
Glucose-Capillary: 139 mg/dL — ABNORMAL HIGH (ref 70–99)
Glucose-Capillary: 139 mg/dL — ABNORMAL HIGH (ref 70–99)
Glucose-Capillary: 173 mg/dL — ABNORMAL HIGH (ref 70–99)
Glucose-Capillary: 166 mg/dL — ABNORMAL HIGH (ref 70–99)
Glucose-Capillary: 181 mg/dL — ABNORMAL HIGH (ref 70–99)
Glucose-Capillary: 165 mg/dL — ABNORMAL HIGH (ref 70–99)
Glucose-Capillary: 162 mg/dL — ABNORMAL HIGH (ref 70–99)
Glucose-Capillary: 140 mg/dL — ABNORMAL HIGH (ref 70–99)
Glucose-Capillary: 167 mg/dL — ABNORMAL HIGH (ref 70–99)

## 2018-11-30 LAB — LACTIC ACID, PLASMA: Lactic Acid, Venous: 0.9 mmol/L (ref 0.5–1.9)

## 2018-11-30 LAB — POCT I-STAT 3, ART BLOOD GAS (G3+)
Acid-base deficit: 6 mmol/L — ABNORMAL HIGH (ref 0.0–2.0)
BICARBONATE: 18.8 mmol/L — AB (ref 20.0–28.0)
O2 Saturation: 94 %
TCO2: 20 mmol/L — ABNORMAL LOW (ref 22–32)
pCO2 arterial: 34.7 mmHg (ref 32.0–48.0)
pH, Arterial: 7.341 — ABNORMAL LOW (ref 7.350–7.450)
pO2, Arterial: 75 mmHg — ABNORMAL LOW (ref 83.0–108.0)

## 2018-11-30 LAB — BASIC METABOLIC PANEL
Anion gap: 11 (ref 5–15)
BUN: 42 mg/dL — AB (ref 6–20)
CO2: 21 mmol/L — ABNORMAL LOW (ref 22–32)
Calcium: 8.7 mg/dL — ABNORMAL LOW (ref 8.9–10.3)
Chloride: 104 mmol/L (ref 98–111)
Creatinine, Ser: 2.31 mg/dL — ABNORMAL HIGH (ref 0.61–1.24)
GFR calc Af Amer: 34 mL/min — ABNORMAL LOW (ref >60)
GFR, EST NON AFRICAN AMERICAN: 30 mL/min — AB (ref >60)
Glucose, Bld: 122 mg/dL — ABNORMAL HIGH (ref 70–99)
Potassium: 5 mmol/L (ref 3.5–5.1)
Sodium: 136 mmol/L (ref 135–145)

## 2018-11-30 LAB — LIPASE, BLOOD: Lipase: 24 U/L (ref 11–51)

## 2018-11-30 LAB — URINE CULTURE: Culture: NO GROWTH

## 2018-11-30 LAB — AMYLASE: Amylase: 62 U/L (ref 28–100)

## 2018-11-30 MED ORDER — SODIUM CHLORIDE 0.9% FLUSH
10.0000 mL | Freq: Two times a day (BID) | INTRAVENOUS | Status: DC
  Administered 2018-11-30: 20 mL
  Administered 2018-12-01 – 2018-12-03 (×5): 10 mL
  Administered 2018-12-03 – 2018-12-04 (×2): 20 mL
  Administered 2018-12-04 – 2018-12-30 (×42): 10 mL

## 2018-11-30 MED ORDER — SODIUM CHLORIDE 0.9 % IV SOLN
1.0000 g | Freq: Two times a day (BID) | INTRAVENOUS | Status: DC
  Administered 2018-11-30: 1 g via INTRAVENOUS
  Filled 2018-11-30 (×2): qty 1

## 2018-11-30 MED ORDER — SODIUM CHLORIDE 0.9% FLUSH
10.0000 mL | INTRAVENOUS | Status: DC | PRN
  Administered 2018-12-09: 40 mL
  Filled 2018-11-30: qty 40

## 2018-11-30 MED ORDER — INSULIN DETEMIR 100 UNIT/ML ~~LOC~~ SOLN
12.0000 [IU] | Freq: Two times a day (BID) | SUBCUTANEOUS | Status: DC
  Administered 2018-12-01 – 2018-12-02 (×4): 12 [IU] via SUBCUTANEOUS
  Filled 2018-11-30 (×5): qty 0.12

## 2018-11-30 MED ORDER — OSELTAMIVIR PHOSPHATE 6 MG/ML PO SUSR
30.0000 mg | Freq: Two times a day (BID) | ORAL | Status: AC
  Administered 2018-11-30 – 2018-12-03 (×5): 30 mg
  Filled 2018-11-30 (×6): qty 12.5

## 2018-11-30 MED ORDER — INSULIN ASPART 100 UNIT/ML ~~LOC~~ SOLN
2.0000 [IU] | SUBCUTANEOUS | Status: DC
  Administered 2018-12-01 (×2): 4 [IU] via SUBCUTANEOUS
  Administered 2018-12-01: 2 [IU] via SUBCUTANEOUS
  Administered 2018-12-01: 4 [IU] via SUBCUTANEOUS
  Administered 2018-12-01: 2 [IU] via SUBCUTANEOUS
  Administered 2018-12-02 (×2): 4 [IU] via SUBCUTANEOUS
  Administered 2018-12-02: 6 [IU] via SUBCUTANEOUS
  Administered 2018-12-02: 2 [IU] via SUBCUTANEOUS

## 2018-11-30 MED ORDER — MIDAZOLAM HCL 2 MG/2ML IJ SOLN
1.0000 mg | INTRAMUSCULAR | Status: DC | PRN
  Administered 2018-11-30 – 2018-12-01 (×3): 1 mg via INTRAVENOUS
  Filled 2018-11-30 (×3): qty 2

## 2018-11-30 MED ORDER — CHLORHEXIDINE GLUCONATE CLOTH 2 % EX PADS
6.0000 | MEDICATED_PAD | Freq: Every day | CUTANEOUS | Status: DC
  Administered 2018-11-30 – 2018-12-29 (×26): 6 via TOPICAL

## 2018-11-30 NOTE — Progress Notes (Signed)
IV fluids leaking on bed. Two small gauge PIVs in L hand. IV team, CCM called.

## 2018-11-30 NOTE — Progress Notes (Signed)
Progress Note  Patient Name: Antonio [name]. Date of Encounter: 11/30/2018  Primary Cardiologist: Dr. Percival Spanish  Subjective   Patient intubated and sedated. Spoke with patient's mother and patient's son, who were both at bedside.  Inpatient Medications    Scheduled Meds: . aspirin  81 mg Per Tube Daily  . atorvastatin  40 mg Oral q1800  . chlorhexidine gluconate (MEDLINE KIT)  15 mL Mouth Rinse BID  . Chlorhexidine Gluconate Cloth  6 each Topical Daily  . enoxaparin (LOVENOX) injection  40 mg Subcutaneous Daily  . mouth rinse  15 mL Mouth Rinse 10 times per day  . oseltamivir  75 mg Per Tube BID  . sodium chloride flush  10-40 mL Intracatheter Q12H   Continuous Infusions: . ceFEPime (MAXIPIME) IV 2 g (11/30/18 1136)  . dextrose 5 % and 0.9% NaCl 50 mL/hr at 11/30/18 0700  . famotidine (PEPCID) IV Stopped (11/29/18 2201)  . fentaNYL infusion INTRAVENOUS 100 mcg/hr (11/30/18 0700)  . insulin 3.2 mL/hr at 11/30/18 0700  . norepinephrine (LEVOPHED) Adult infusion 12 mcg/min (11/30/18 0855)   PRN Meds: acetaminophen (TYLENOL) oral liquid 160 mg/5 mL, docusate, fentaNYL (SUBLIMAZE) injection, midazolam, sodium chloride flush   Vital Signs    Vitals:   11/30/18 0700 11/30/18 0800 11/30/18 0807 11/30/18 1122  BP: 111/61  102/64   Pulse: 92  89 93  Resp: (!) 28  (!) 27 (!) 28  Temp:  99.3 F (37.4 C)    TempSrc:  Axillary    SpO2: 96%  96% 91%  Weight:      Height:        Intake/Output Summary (Last 24 hours) at 11/30/2018 1219 Last data filed at 11/30/2018 0700 Gross per 24 hour  Intake 1840.74 ml  Output 475 ml  Net 1365.74 ml   Filed Weights   11/29/18 0010 11/29/18 0500 11/30/18 0404  Weight: 104 kg 104.5 kg 100.9 kg    Telemetry    Sinus tach - Personally Reviewed  ECG    No new since 1/3 - Personally Reviewed  Physical Exam   GEN: intubated and sedated Neck: unable to assess JVD Cardiac: RRR, no murmurs, rubs, or gallops.  Respiratory:  distant, coarse, mechanical vent breath sounds GI: Soft, nontender, non-distended  MS: No edema. Left mid calf BKA, right great toe amputation Neuro:  Nonfocal  Psych: intubated and sedated  Labs    Chemistry Recent Labs  Lab 11/28/18 1725  11/29/18 0042 11/29/18 0332 11/29/18 0717 11/29/18 2228 11/30/18 0244  NA  --    < > 130*  --   --  136 136  K  --    < > 5.0  --   --  4.6 5.0  CL  --    < > 99  --   --  104 104  CO2  --   --  16*  --   --  20* 21*  GLUCOSE  --    < > 454*  --  572* 98 122*  BUN  --    < > 23*  --   --  41* 42*  CREATININE  --    < > 1.33* 1.60*  --  2.52* 2.31*  CALCIUM  --   --  8.5*  --   --  8.6* 8.7*  PROT 7.2  --   --   --   --   --   --   ALBUMIN 3.7  --   --   --   --   --   --  AST 28  --   --   --   --   --   --   ALT 23  --   --   --   --   --   --   ALKPHOS 84  --   --   --   --   --   --   BILITOT 0.7  --   --   --   --   --   --   GFRNONAA  --   --  58* 46*  --  27* 30*  GFRAA  --   --  >60 53*  --  31* 34*  ANIONGAP  --   --  15  --   --  12 11   < > = values in this interval not displayed.     Hematology Recent Labs  Lab 11/29/18 0042 11/29/18 0332 11/30/18 0244  WBC 10.7* 11.8* 14.1*  RBC 5.72 5.83* 5.99*  HGB 17.1* 17.2* 17.3*  HCT 51.8 51.6 52.9*  MCV 90.6 88.5 88.3  MCH 29.9 29.5 28.9  MCHC 33.0 33.3 32.7  RDW 13.2 13.0 13.3  PLT 231 236 167    Cardiac Enzymes Recent Labs  Lab 11/29/18 0042  TROPONINI 0.23*    Recent Labs  Lab 11/28/18 1717 11/28/18 2037  TROPIPOC 0.03 0.04     BNP Recent Labs  Lab 11/28/18 1659  BNP 113.0*     DDimer  Recent Labs  Lab 11/28/18 2011 11/29/18 0332  DDIMER 0.66* 13.12*     Radiology    Ct Abdomen Pelvis Wo Contrast  Result Date: 11/28/2018 CLINICAL DATA:  Midsternal chest pain. Shortness of breath. By report the patient has an IV contrast allergy. EXAM: CT CHEST, ABDOMEN AND PELVIS WITHOUT CONTRAST TECHNIQUE: Multidetector CT imaging of the chest, abdomen and  pelvis was performed following the standard protocol without IV contrast. COMPARISON:  Chest radiograph 11/28/2018 FINDINGS: Factors adversely affecting the sensitivity and specificity of today's exam: 1. Extensive breathing motion artifact. 2. Suboptimal positioning of the arms and hands, with the fingers and hands interlocked in folded across the chest, introducing streak artifact. CT CHEST FINDINGS Cardiovascular: Coronary, aortic arch, and branch vessel atherosclerotic vascular disease. Mediastinum/Nodes: 1.0 cm in short axis right paratracheal lymph node on image 26/3. Lungs/Pleura: Blurred parenchyma due to extensive breathing motion artifact. Presumably the patient was unable to hold his breath. Suspected airway thickening. Musculoskeletal: Lower thoracic spondylosis. CT ABDOMEN PELVIS FINDINGS Hepatobiliary: Unremarkable Pancreas: Unremarkable Spleen: Unremarkable Adrenals/Urinary Tract: Vascular calcification in the right renal hilum. Suspected left peripelvic cysts. Adrenal glands normal. Stomach/Bowel: Unremarkable Vascular/Lymphatic: Aortoiliac atherosclerotic vascular disease. Small scattered bilateral external iliac and inguinal lymph nodes. Reproductive: Amorphous calcifications centrally in the prostate gland. Other: No supplemental non-categorized findings. Musculoskeletal: Disc osteophyte complex at L5-S1. IMPRESSION: 1. Airway thickening is present, suggesting bronchitis or reactive airways disease. 2. Coronary, aortic arch, and branch vessel atherosclerotic vascular disease. Aortic Atherosclerosis (ICD10-I70.0). 3. Borderline enlarged right paratracheal lymph node, nonspecific. 4. Motion artifact and suboptimal arm positioning adversely affect sensitivity and specificity. Electronically Signed   By: Van Clines M.D.   On: 11/28/2018 19:18   Ct Chest Wo Contrast  Result Date: 11/28/2018 CLINICAL DATA:  Midsternal chest pain. Shortness of breath. By report the patient has an IV contrast  allergy. EXAM: CT CHEST, ABDOMEN AND PELVIS WITHOUT CONTRAST TECHNIQUE: Multidetector CT imaging of the chest, abdomen and pelvis was performed following the standard protocol without IV contrast. COMPARISON:  Chest radiograph 11/28/2018  FINDINGS: Factors adversely affecting the sensitivity and specificity of today's exam: 1. Extensive breathing motion artifact. 2. Suboptimal positioning of the arms and hands, with the fingers and hands interlocked in folded across the chest, introducing streak artifact. CT CHEST FINDINGS Cardiovascular: Coronary, aortic arch, and branch vessel atherosclerotic vascular disease. Mediastinum/Nodes: 1.0 cm in short axis right paratracheal lymph node on image 26/3. Lungs/Pleura: Blurred parenchyma due to extensive breathing motion artifact. Presumably the patient was unable to hold his breath. Suspected airway thickening. Musculoskeletal: Lower thoracic spondylosis. CT ABDOMEN PELVIS FINDINGS Hepatobiliary: Unremarkable Pancreas: Unremarkable Spleen: Unremarkable Adrenals/Urinary Tract: Vascular calcification in the right renal hilum. Suspected left peripelvic cysts. Adrenal glands normal. Stomach/Bowel: Unremarkable Vascular/Lymphatic: Aortoiliac atherosclerotic vascular disease. Small scattered bilateral external iliac and inguinal lymph nodes. Reproductive: Amorphous calcifications centrally in the prostate gland. Other: No supplemental non-categorized findings. Musculoskeletal: Disc osteophyte complex at L5-S1. IMPRESSION: 1. Airway thickening is present, suggesting bronchitis or reactive airways disease. 2. Coronary, aortic arch, and branch vessel atherosclerotic vascular disease. Aortic Atherosclerosis (ICD10-I70.0). 3. Borderline enlarged right paratracheal lymph node, nonspecific. 4. Motion artifact and suboptimal arm positioning adversely affect sensitivity and specificity. Electronically Signed   By: Van Clines M.D.   On: 11/28/2018 19:18   Dg Chest Port 1  View  Result Date: 11/30/2018 CLINICAL DATA:  Acute respiratory failure.  Hypoxia. EXAM: PORTABLE CHEST 1 VIEW COMPARISON:  11/29/2018 FINDINGS: Enteric tube courses through the stomach and off the film as tip is not visualized. Endotracheal tube has tip 6.6 cm above the carina. Lungs are adequately inflated without lobar consolidation or effusion. Subtle stable prominence of the perihilar markings. Cardiomediastinal silhouette and remainder of the exam is unchanged. IMPRESSION: Subtle hazy prominence of the perihilar markings which may be due to minimal vascular congestion or acute bronchitic process. Tubes and lines as described. Electronically Signed   By: Marin Olp M.D.   On: 11/30/2018 08:16   Dg Chest Port 1 View  Result Date: 11/29/2018 CLINICAL DATA:  Intubation. EXAM: PORTABLE CHEST 1 VIEW COMPARISON:  11/28/2018 FINDINGS: Endotracheal tube has tip 5.8 cm above the carina. Nasogastric tube courses into the region of the stomach and off the inferior portion of the film as tip is not visualized. Patient is slightly rotated to the left. Lungs are adequately inflated demonstrate no focal lobar consolidation or effusion. Cardiomediastinal silhouette and remainder of the exam is unchanged. IMPRESSION: No acute findings. Tubes and lines as described. Electronically Signed   By: Marin Olp M.D.   On: 11/29/2018 08:26   Dg Chest Portable 1 View  Result Date: 11/28/2018 CLINICAL DATA:  Intubation. EXAM: PORTABLE CHEST 1 VIEW COMPARISON:  Radiographs and CT earlier this day. FINDINGS: Endotracheal tube tip at the thoracic inlet 7 cm from the carina. Unchanged heart size and mediastinal contours. Bronchial thickening again seen. No new airspace disease, pleural effusion or pneumothorax. Multiple overlying monitoring devices in place. IMPRESSION: 1. Endotracheal tube tip at the thoracic inlet 7 cm from the carina. 2. Unchanged bronchial thickening. Electronically Signed   By: Keith Rake M.D.   On:  11/28/2018 22:56   Dg Chest Port 1 View  Result Date: 11/28/2018 CLINICAL DATA:  Chest pain. EXAM: PORTABLE CHEST 1 VIEW COMPARISON:  04/29/2018 FINDINGS: Lungs are adequately inflated as patient is slightly rotated to the right. There is no focal airspace consolidation or effusion. Borderline cardiomegaly. Remainder of the exam is unchanged. IMPRESSION: No active disease. Electronically Signed   By: Marin Olp M.D.   On: 11/28/2018 17:50  Dg Abd Portable 1v  Result Date: 11/29/2018 CLINICAL DATA:  Enteric tube placement. EXAM: PORTABLE ABDOMEN - 1 VIEW COMPARISON:  Recent chest radiograph as well as previous CT 11/28/2018. FINDINGS: Evidence of patient's enteric tube which courses into the stomach as tip is right of midline likely over the distal stomach or proximal duodenum. Bowel gas pattern is nonobstructive. No free peritoneal air. Remainder the exam is unchanged. IMPRESSION: Enteric tube with tip over the right upper quadrant likely over the distal stomach or proximal duodenum. Electronically Signed   By: Marin Olp M.D.   On: 11/29/2018 08:28   [place] Ekg Site Rite  Result Date: 11/30/2018 If Site Rite image not attached, placement could not be confirmed due to current cardiac rhythm.   Cardiac Studies   Echo 11/29/18 - Left ventricle: The cavity size was normal. Wall thickness was   normal. Systolic function was severely reduced. The estimated   ejection fraction was in the range of 20% to 25%. There is   akinesis of the anteroseptal, anterolateral, and apical   myocardium. Doppler parameters are consistent with abnormal left   ventricular relaxation (grade 1 diastolic dysfunction). - Pericardium, extracardiac: A small pericardial effusion was   identified circumferential to the heart. The fluid exhibited a   fibrinous appearance.There was no evidence of hemodynamic   compromise.  Patient Profile     63 y.o. male with history of CAD, reduced EF (40% on lexiscan 04/2018) who  presented with respiratory failure due to influenza. Cardiology following for further reduction in EF and elevated troponin.  Assessment & Plan    Respiratory failure in the setting of influenza: still intermittently febrile, intubated, on pressors.  -as per PCCM  Acute on chronic systolic heart failure: EF depressed to 20-25% on echo. Last EF prior was on lexiscan stress, noted as 40% -given acute illness as well as hypotension requiring pressors, therapy for acute systolic heart failure is on hold -once he has recovered, can start goal directed medical therapy for heart failure, including beta blocker and neurohormonal agents. Unable to being this now give his acute critical illness and respiratory failure.  History of CAD: Lexiscan 04/2018 with EF 40% and fixed perfusion defect in inferior and inferolateral area, consistent with infarct. Possible peri-infarct ischemia. -troponins mildly elevated, not surprising given his critical illness -no plans for ischemic evaluation until more recovered from flu  Small pericardial effusion: no echo evidence of hemodynamic compromise.  Discussed cardiac condition at length with family. Mother endorses that he is a heavy smoker, she wants him to quit. This will be important once he is ready for discharge.  CRITICAL CARE Patient is critically ill with multiple organ systems affected and requires high complexity decision making. Total critical care time: 30 minutes. This time includes gathering of history, evaluation of patient's response to treatment, examination of patient, review of laboratory and imaging studies, and coordination with consultants.   For questions or updates, please contact [place] Please consult [email or web address] for contact info under     Signed, Buford Dresser, MD  11/30/2018, 12:19 PM

## 2018-11-30 NOTE — Progress Notes (Signed)
NAME:  Antonio RichardsDaniel Watson Douthit [name]., MRN:  [id number], DOB:  08-Aug-1956, LOS: 2 ADMISSION DATE:  11/28/2018, CONSULTATION DATE:  11/28/18 REFERRING MD:  Dr. Katrinka BlazingSmith, CHIEF COMPLAINT: Respiratory distress   Brief History   63 y/o M who [facility] with fever, cough, chest pain & SOB.  He was seen recently at an UC to have a "boil lanced".  Then became sick after the visit.  Initially was being worked up as chest pain, r/o NSTEMI.  Then found to be Influenza A positive. Developed progressive respiratory distress requiring intubation in the ER.  Elevated glucose requiring insulin gtt.   Past Medical History  HTN - poorly controlled, non-compliant as outpatient  HLD DM  CAD  Osteomyelitis s/p L BKA, R great toe amputation    Significant Hospital Events   01/03  Admit with influenza A, resp failure  01/05  Glucose control improved, remains on 10 levophed, intubated  Consults:  PCCM   Procedures:  ETT 1/3 >>  RUE PICC 1/5 >>   Significant Diagnostic Tests:  UDS 1/4 >> negative  ECHO 1/4 >> LVEF 20-25%, akinesis of the anteroseptal, anterolateral & apical myocardium, LVEF 20-25%, grade 1 diastolic dysfunction, small pericardial effusion without evidence of hemodynamic compromise CT Chest / ABD / Pelvis 1/3 >> airway thickening, motion artifact, CAD  Micro Data:  RVP 1/3 >> positive for influenza A BCx2 1/3 >>  UC 1/4 >> negative   Antimicrobials:  Vanco 1/3 x1 Zosyn 1/30 x1  Tamiflu 1/3 >>   Interim history/subjective:  RN reports improved glucose control on insulin gtt.  Remains on 10 mcg levophed, fentanyl gtt  Objective   Blood pressure 92/64, pulse 87, temperature 99.9 F (37.7 C), temperature source Axillary, resp. rate (!) 28, height 6' (1.829 m), weight 100.9 kg, SpO2 92 %.    Vent Mode: PRVC FiO2 (%):  [40 %-50 %] 40 % Set Rate:  [28 bmp] 28 bmp Vt Set:  [409[620 mL] 620 mL PEEP:  [8 cmH20] 8 cmH20 Plateau Pressure:  [20 cmH20-26 cmH20] 21 cmH20   Intake/Output  Summary (Last 24 hours) at 11/30/2018 1643 Last data filed at 11/30/2018 1500 Gross per 24 hour  Intake 2477.93 ml  Output 600 ml  Net 1877.93 ml   Filed Weights   11/29/18 0010 11/29/18 0500 11/30/18 0404  Weight: 104 kg 104.5 kg 100.9 kg    Examination: General: critically ill appearing adult male lying in bed in NAD HEENT: MM pink/moist, ETT Neuro: sedate CV: s1s2 rrr, no m/r/g PULM: even/non-labored, lungs bilaterally diminished  WJ:XBJYGI:soft, non-tender, bsx4 active  Extremities: warm/dry, no edema, L BKA, R great toe amputation   Skin: no rashes or lesions on exposed skin  Resolved Hospital Problem list      Assessment & Plan:   Acute Hypoxemic Respiratory Failure secondary to Influenza A P: PRVC 8 cc/kg Wean PEEP/FiO2 for sats > 90% Follow intermittent CXR  Continue tamiflu x5 days  Daily SBT / WUA   DM / Hyperglycemia / Mild DKA  -poorly controlled  -5 ketones in urine  P: Continue insulin gtt  Trend CBG per protocol   Mild Hypotension / SIRS -MAP >65 but SBP running 80-90's  -suspect fever / SIRS response contributing  P: ICU monitoring  Wean levophed for MAP >65   Hypertriglyceridemia  P: Follow up triglycerides 1/6 am    Hyperkalemia  At Risk AKI  AGMA P: Trend BMP / urinary output Replace electrolytes as indicated Avoid nephrotoxic agents, ensure  adequate renal perfusion  Hx HTN, HLD, CAD  P: Hold home antihypertensives   At Risk Malnutrition  P: Hold TF 1/5, consider starting in am 1/6   Pain / Agitation  P: Fentanyl for pain  PRN versed for sedation  Daily SBT / WUA    Best practice:  Diet: NPO  Pain/Anxiety/Delirium protocol (if indicated): fentanyl gtt, PRN versed  VAP protocol (if indicated): in place  DVT prophylaxis: lovenox  GI prophylaxis: Pepcid  Glucose control: Insulin gtt  Mobility: bed rest  Code Status: Full code  Family Communication: Family updated at bedside 1/4 on NP rounds.  No family available pm 1/5 rounds.    Disposition: ICU   Labs   CBC: Recent Labs  Lab 11/28/18 1658 11/28/18 1719 11/28/18 2037 11/29/18 0042 11/29/18 0332 11/30/18 0244  WBC 6.8  --   --  10.7* 11.8* 14.1*  HGB 17.2* 18.4* 15.0 17.1* 17.2* 17.3*  HCT 52.4* 54.0* 44.0 51.8 51.6 52.9*  MCV 88.2  --   --  90.6 88.5 88.3  PLT 163  --   --  231 236 167    Basic Metabolic Panel: Recent Labs  Lab 11/28/18 1658 11/28/18 1719 11/28/18 2013 11/28/18 2037 11/29/18 0042 11/29/18 0332 11/29/18 0717 11/29/18 2228 11/30/18 0244  NA 131* 132*  --  131* 130*  --   --  136 136  K 4.9 5.0  --  5.0 5.0  --   --  4.6 5.0  CL 96* 99  --  100 99  --   --  104 104  CO2 24  --   --   --  16*  --   --  20* 21*  GLUCOSE 317* 320*  --  341* 454*  --  572* 98 122*  BUN 13 20  --  24* 23*  --   --  41* 42*  CREATININE 1.09 1.00  --  1.00 1.33* 1.60*  --  2.52* 2.31*  CALCIUM 9.4  --   --   --  8.5*  --   --  8.6* 8.7*  MG  --   --  1.7  --   --   --   --  2.7*  --   PHOS  --   --   --   --   --   --   --  5.6*  --    GFR: Estimated Creatinine Clearance: 41.8 mL/min (A) (by C-G formula based on SCr of 2.31 mg/dL (H)). Recent Labs  Lab 11/28/18 1658 11/28/18 1725 11/28/18 1759 11/28/18 2036 11/29/18 0042 11/29/18 0332 11/30/18 0244  PROCALCITON  --  0.11  --   --   --   --   --   WBC 6.8  --   --   --  10.7* 11.8* 14.1*  LATICACIDVEN  --   --  1.53 1.51  --  2.8*  --     Liver Function Tests: Recent Labs  Lab 11/28/18 1725  AST 28  ALT 23  ALKPHOS 84  BILITOT 0.7  PROT 7.2  ALBUMIN 3.7   Recent Labs  Lab 11/30/18 0244  LIPASE 24  AMYLASE 62   Recent Labs  Lab 11/28/18 1725  AMMONIA 55*    ABG    Component Value Date/Time   PHART 7.247 (L) 11/29/2018 0211   PCO2ART 45.1 11/29/2018 0211   PO2ART 292.0 (H) 11/29/2018 0211   HCO3 23.5 11/30/2018 0057   TCO2 25 11/30/2018 0057  ACIDBASEDEF 2.0 11/30/2018 0057   O2SAT 79.0 11/30/2018 0057     Coagulation Profile: Recent Labs  Lab  11/28/18 1658  INR 0.97    Cardiac Enzymes: Recent Labs  Lab 11/29/18 0042  TROPONINI 0.23*    HbA1C: Hgb A1c MFr Bld  Date/Time Value Ref Range Status  03/25/2017 05:59 AM 12.3 (H) 4.8 - 5.6 % Final    Comment:    (NOTE)         Pre-diabetes: 5.7 - 6.4         Diabetes: >6.4         Glycemic control for adults with diabetes: <7.0   12/28/2015 08:50 PM 11.7 (H) 4.8 - 5.6 % Final    Comment:    (NOTE)         Pre-diabetes: 5.7 - 6.4         Diabetes: >6.4         Glycemic control for adults with diabetes: <7.0     CBG: Recent Labs  Lab 11/30/18 1121 11/30/18 1225 11/30/18 1330 11/30/18 1438 11/30/18 1550  GLUCAP 135* 139* 139* 173* 166*    Critical care time: 30 minutes      Canary Brim, NP-C [facility] Pgr: [phone] or if no answer [phone] 11/30/2018, 4:43 PM

## 2018-11-30 NOTE — Progress Notes (Signed)
Peripherally Inserted Central Catheter/Midline Placement  The IV Nurse has discussed with the patient and/or persons authorized to consent for the patient, the purpose of this procedure and the potential benefits and risks involved with this procedure.  The benefits include less needle sticks, lab draws from the catheter, and the patient may be discharged home with the catheter. Risks include, but not limited to, infection, bleeding, blood clot (thrombus formation), and puncture of an artery; nerve damage and irregular heartbeat and possibility to perform a PICC exchange if needed/ordered by physician.  Alternatives to this procedure were also discussed.  Bard Power PICC patient education guide, fact sheet on infection prevention and patient information card has been provided to patient /or left at bedside.  Son gave telephone consent.  Son and mother were present at bedside upon arrival without further questions noted.  PICC/Midline Placement Documentation  PICC Triple Lumen 11/30/18 PICC Right Cephalic 42 cm 2 cm (Active)  Indication for Insertion or Continuance of Line Vasoactive infusions 11/30/2018 11:02 AM  Exposed Catheter (cm) 2 cm 11/30/2018 11:02 AM  Site Assessment Clean;Dry;Intact 11/30/2018 11:02 AM  Lumen #1 Status Saline locked;Flushed;Blood return noted 11/30/2018 11:02 AM  Lumen #2 Status Flushed;Saline locked;Blood return noted 11/30/2018 11:02 AM  Lumen #3 Status Saline locked;Flushed;Blood return noted 11/30/2018 11:02 AM  Dressing Type Transparent 11/30/2018 11:02 AM  Dressing Status Clean;Dry;Antimicrobial disc in place;Intact 11/30/2018 11:02 AM  Line Care Connections checked and tightened 11/30/2018 11:02 AM  Line Adjustment (NICU/IV Team Only) No 11/30/2018 11:02 AM  Dressing Intervention New dressing 11/30/2018 11:02 AM  Dressing Change Due 12/07/18 11/30/2018 11:02 AM       Antonio [name] 11/30/2018, 11:03 AM

## 2018-11-30 NOTE — Progress Notes (Signed)
eLink Physician-Brief Progress Note Patient Name: Antonio [name]. DOB: January 28, 1956 MRN: [id number]   Date of Service  11/30/2018  HPI/Events of Note  Hyperglycemia - Blood glucose = 155 --> 175 --> 167. Currently on an insulin IV infusion at 2.8 units/hour.   eICU Interventions  Will order: 1. Levemir insulin 12 units now and Q 12 hours.  2. Q 4 hour Standard Novolog SSI.      Intervention Category Major Interventions: Hyperglycemia - active titration of insulin therapy  Lenell Antu 11/30/2018, 11:41 PM

## 2018-12-01 ENCOUNTER — Inpatient Hospital Stay (HOSPITAL_COMMUNITY): Payer: Medicaid Other

## 2018-12-01 DIAGNOSIS — L8990 Pressure ulcer of unspecified site, unspecified stage: Secondary | ICD-10-CM

## 2018-12-01 DIAGNOSIS — J1100 Influenza due to unidentified influenza virus with unspecified type of pneumonia: Secondary | ICD-10-CM

## 2018-12-01 LAB — GLUCOSE, CAPILLARY
GLUCOSE-CAPILLARY: 164 mg/dL — AB (ref 70–99)
GLUCOSE-CAPILLARY: 148 mg/dL — AB (ref 70–99)
GLUCOSE-CAPILLARY: 156 mg/dL — AB (ref 70–99)
Glucose-Capillary: 105 mg/dL — ABNORMAL HIGH (ref 70–99)
Glucose-Capillary: 136 mg/dL — ABNORMAL HIGH (ref 70–99)
Glucose-Capillary: 155 mg/dL — ABNORMAL HIGH (ref 70–99)
Glucose-Capillary: 174 mg/dL — ABNORMAL HIGH (ref 70–99)
Glucose-Capillary: 394 mg/dL — ABNORMAL HIGH (ref 70–99)
Glucose-Capillary: 568 mg/dL (ref 70–99)

## 2018-12-01 LAB — BASIC METABOLIC PANEL
Anion gap: 13 (ref 5–15)
BUN: 46 mg/dL — ABNORMAL HIGH (ref 6–20)
CO2: 19 mmol/L — ABNORMAL LOW (ref 22–32)
Calcium: 7.7 mg/dL — ABNORMAL LOW (ref 8.9–10.3)
Chloride: 104 mmol/L (ref 98–111)
Creatinine, Ser: 2.07 mg/dL — ABNORMAL HIGH (ref 0.61–1.24)
GFR calc Af Amer: 39 mL/min — ABNORMAL LOW (ref >60)
GFR, EST NON AFRICAN AMERICAN: 34 mL/min — AB (ref >60)
Glucose, Bld: 98 mg/dL (ref 70–99)
Potassium: 4.5 mmol/L (ref 3.5–5.1)
Sodium: 136 mmol/L (ref 135–145)

## 2018-12-01 LAB — CBC
HCT: 48.3 % (ref 39.0–52.0)
Hemoglobin: 15.5 g/dL (ref 13.0–17.0)
MCH: 29.7 pg (ref 26.0–34.0)
MCHC: 32.1 g/dL (ref 30.0–36.0)
MCV: 92.5 fL (ref 80.0–100.0)
PLATELETS: 188 10*3/uL (ref 150–400)
RBC: 5.22 MIL/uL (ref 4.22–5.81)
RDW: 14 % (ref 11.5–15.5)
WBC: 14 10*3/uL — ABNORMAL HIGH (ref 4.0–10.5)
nRBC: 0 % (ref 0.0–0.2)

## 2018-12-01 LAB — POCT I-STAT 3, ART BLOOD GAS (G3+)
Acid-base deficit: 8 mmol/L — ABNORMAL HIGH (ref 0.0–2.0)
Bicarbonate: 18.4 mmol/L — ABNORMAL LOW (ref 20.0–28.0)
O2 Saturation: 94 %
TCO2: 20 mmol/L — AB (ref 22–32)
pCO2 arterial: 38.7 mmHg (ref 32.0–48.0)
pH, Arterial: 7.285 — ABNORMAL LOW (ref 7.350–7.450)
pO2, Arterial: 81 mmHg — ABNORMAL LOW (ref 83.0–108.0)

## 2018-12-01 LAB — TRIGLYCERIDES: Triglycerides: 642 mg/dL — ABNORMAL HIGH (ref <150)

## 2018-12-01 MED ORDER — FENTANYL CITRATE (PF) 100 MCG/2ML IJ SOLN
50.0000 ug | INTRAMUSCULAR | Status: DC | PRN
  Administered 2018-12-01 – 2018-12-02 (×3): 50 ug via INTRAVENOUS
  Filled 2018-12-01 (×2): qty 2

## 2018-12-01 MED ORDER — ALBUTEROL SULFATE (2.5 MG/3ML) 0.083% IN NEBU
2.5000 mg | INHALATION_SOLUTION | RESPIRATORY_TRACT | Status: DC | PRN
  Administered 2018-12-02: 2.5 mg via RESPIRATORY_TRACT
  Filled 2018-12-01: qty 3

## 2018-12-01 MED ORDER — DEXMEDETOMIDINE HCL IN NACL 200 MCG/50ML IV SOLN
0.4000 ug/kg/h | INTRAVENOUS | Status: DC
  Administered 2018-12-01 (×3): 1.2 ug/kg/h via INTRAVENOUS
  Administered 2018-12-01: 0.4 ug/kg/h via INTRAVENOUS
  Administered 2018-12-01: 1.2 ug/kg/h via INTRAVENOUS
  Filled 2018-12-01 (×6): qty 50

## 2018-12-01 MED ORDER — FENTANYL CITRATE (PF) 100 MCG/2ML IJ SOLN
50.0000 ug | Freq: Once | INTRAMUSCULAR | Status: AC
  Administered 2018-12-01: 50 ug via INTRAVENOUS

## 2018-12-01 MED ORDER — ALBUTEROL SULFATE (2.5 MG/3ML) 0.083% IN NEBU
INHALATION_SOLUTION | RESPIRATORY_TRACT | Status: AC
  Administered 2018-12-01: 2.5 mg via RESPIRATORY_TRACT
  Filled 2018-12-01: qty 6

## 2018-12-01 MED ORDER — FUROSEMIDE 10 MG/ML IJ SOLN
80.0000 mg | Freq: Once | INTRAMUSCULAR | Status: AC
  Administered 2018-12-01: 80 mg via INTRAVENOUS
  Filled 2018-12-01: qty 8

## 2018-12-01 MED ORDER — DEXMEDETOMIDINE HCL IN NACL 400 MCG/100ML IV SOLN
0.4000 ug/kg/h | INTRAVENOUS | Status: DC
  Administered 2018-12-01 – 2018-12-02 (×2): 1.2 ug/kg/h via INTRAVENOUS
  Administered 2018-12-02: 0.6 ug/kg/h via INTRAVENOUS
  Administered 2018-12-02: 0.7 ug/kg/h via INTRAVENOUS
  Administered 2018-12-02: 0.9 ug/kg/h via INTRAVENOUS
  Administered 2018-12-03: 0.8 ug/kg/h via INTRAVENOUS
  Administered 2018-12-03: 0.7 ug/kg/h via INTRAVENOUS
  Administered 2018-12-03: 0.5 ug/kg/h via INTRAVENOUS
  Administered 2018-12-03: 0.7 ug/kg/h via INTRAVENOUS
  Administered 2018-12-04 (×2): 0.9 ug/kg/h via INTRAVENOUS
  Filled 2018-12-01 (×11): qty 100

## 2018-12-01 MED ORDER — FENTANYL CITRATE (PF) 100 MCG/2ML IJ SOLN
25.0000 ug | INTRAMUSCULAR | Status: DC | PRN
  Administered 2018-12-01: 25 ug via INTRAVENOUS
  Filled 2018-12-01 (×2): qty 2

## 2018-12-01 MED ORDER — GERHARDT'S BUTT CREAM
TOPICAL_CREAM | Freq: Two times a day (BID) | CUTANEOUS | Status: DC
  Administered 2018-12-01 – 2018-12-02 (×3): 1 via TOPICAL
  Administered 2018-12-03: 01:00:00 via TOPICAL
  Administered 2018-12-03 – 2018-12-04 (×2): 1 via TOPICAL
  Administered 2018-12-04 – 2018-12-05 (×4): via TOPICAL
  Administered 2018-12-06: 1 via TOPICAL
  Administered 2018-12-06 – 2018-12-09 (×7): via TOPICAL
  Administered 2018-12-10: 1 via TOPICAL
  Administered 2018-12-10 – 2018-12-11 (×2): via TOPICAL
  Administered 2018-12-11 – 2018-12-12 (×2): 1 via TOPICAL
  Administered 2018-12-12: 11:00:00 via TOPICAL
  Administered 2018-12-13: 1 via TOPICAL
  Administered 2018-12-13 – 2018-12-15 (×4): via TOPICAL
  Administered 2018-12-16 (×2): 1 via TOPICAL
  Administered 2018-12-17: 10:00:00 via TOPICAL
  Filled 2018-12-01 (×2): qty 1

## 2018-12-01 NOTE — Progress Notes (Signed)
NAME:  Antonio RichardsDaniel Watson Loeffelholz Jr., MRN:  [id number], DOB:  06-13-1956, LOS: 3 ADMISSION DATE:  11/28/2018, CONSULTATION DATE:  11/28/18 REFERRING MD:  Katrinka BlazingSmith - ED [facility], CHIEF COMPLAINT:  Respiratory failure.   HPI/course in hospital  63 year old with fever, cough and chest pain and SOB. Intubated in ED. Influenza A positive.   Initially work up for NSTEMI with intermediate troponin rise.  Found to have worsened EF to 20% with prior known EF 40% by Lexiscan.  Past Medical History   Past Medical History:  Diagnosis Date  . Coronary artery disease   . Diabetes mellitus without complication (HCC)   . Hyperlipidemia   . Hypertension   . Osteomyelitis (HCC) 03/25/2017   RT FOOT    Past Surgical History:  Procedure Laterality Date  . AMPUTATION Right 03/27/2017   Procedure: 1st Ray Amputation Right Foot;  Surgeon: Nadara MustardMarcus Duda V, MD;  [facility];  Service: Orthopedics;  Laterality: Right;  . APPENDECTOMY    . CARDIAC CATHETERIZATION    . CORONARY STENT INTERVENTION  2005  . Great toe amputation right.    . I&D EXTREMITY Left 12/30/2015   Procedure: IRRIGATION AND DEBRIDEMENT LEFT FOOT, TRANSMETATARSAL AMPUTATION WITH APPLICATION OF ANTIBIOTIC BEADS AND WOUND VAC;  Surgeon: Nadara MustardMarcus Duda V, MD;  Location: [facility] OR;  Service: Orthopedics;  Laterality: Left;  . TONSILLECTOMY      Interim history/subjective:  Some improvement since admission. Insulin regimen intensified overnight.   Agitated on sedation interruption, patient indicates that tube causing discomfort.  Objective   Blood pressure 135/87, pulse 87, temperature 100.3 F (37.9 C), temperature source Axillary, resp. rate (!) 28, height 6' (1.829 m), weight 100.5 kg, SpO2 93 %. CVP:  [11 mmHg-16 mmHg] 16 mmHg  Vent Mode: CPAP;PSV FiO2 (%):  [40 %] 40 % Set Rate:  [28 bmp] 28 bmp Vt Set:  [620 mL] 620 mL PEEP:  [8 cmH20] 8 cmH20 Pressure Support:  [8 cmH20] 8 cmH20 Plateau Pressure:  [21 cmH20-32 cmH20] 28 cmH20   Intake/Output  Summary (Last 24 hours) at 12/01/2018 1007 Last data filed at 12/01/2018 0800 Gross per 24 hour  Intake 2778.96 ml  Output 675 ml  Net 2103.96 ml   Filed Weights   11/29/18 0500 11/30/18 0404 12/01/18 0500  Weight: 104.5 kg 100.9 kg 100.5 kg    Examination: Physical Exam  Constitutional: Vital signs are normal. He appears well-developed and well-nourished. He appears listless. He is cooperative. He is sedated and intubated.  HENT:  Head: Normocephalic and atraumatic.  Eyes: Pupils are equal, round, and reactive to light.  Neck: No JVD present.  Cardiovascular: Normal rate and normal heart sounds. Exam reveals no gallop.  No murmur heard. Respiratory: Effort normal and breath sounds normal. He is intubated.  Tolerated 2 h SBT with SBI 20's  GI: Soft. There is no abdominal tenderness.  Musculoskeletal:     Comments: L BKA. R toe amputation.  Neurological: He appears listless.  Skin: Skin is dry.  No edema     Ancillary tests (personally reviewed)  CBC: Recent Labs  Lab 11/28/18 1658  11/28/18 2037 11/29/18 0042 11/29/18 0332 11/30/18 0244 12/01/18 0447  WBC 6.8  --   --  10.7* 11.8* 14.1* 14.0*  HGB 17.2*   < > 15.0 17.1* 17.2* 17.3* 15.5  HCT 52.4*   < > 44.0 51.8 51.6 52.9* 48.3  MCV 88.2  --   --  90.6 88.5 88.3 92.5  PLT 163  --   --  231 236 167 188   < > = values in this interval not displayed.    Basic Metabolic Panel: Recent Labs  Lab 11/28/18 1658  11/28/18 2013 11/28/18 2037 11/29/18 0042 11/29/18 0332 11/29/18 0717 11/29/18 2228 11/30/18 0244 12/01/18 0447  NA 131*   < >  --  131* 130*  --   --  136 136 136  K 4.9   < >  --  5.0 5.0  --   --  4.6 5.0 4.5  CL 96*   < >  --  100 99  --   --  104 104 104  CO2 24  --   --   --  16*  --   --  20* 21* PENDING  GLUCOSE 317*   < >  --  341* 454*  --  572* 98 122* 98  BUN 13   < >  --  24* 23*  --   --  41* 42* 46*  CREATININE 1.09   < >  --  1.00 1.33* 1.60*  --  2.52* 2.31* 2.07*  CALCIUM 9.4  --   --    --  8.5*  --   --  8.6* 8.7* 7.7*  MG  --   --  1.7  --   --   --   --  2.7*  --   --   PHOS  --   --   --   --   --   --   --  5.6*  --   --    < > = values in this interval not displayed.   GFR: Estimated Creatinine Clearance: 46.6 mL/min (A) (by C-G formula based on SCr of 2.07 mg/dL (H)). Recent Labs  Lab 11/28/18 1725 11/28/18 1759 11/28/18 2036 11/29/18 0042 11/29/18 0332 11/30/18 0244 11/30/18 1901 12/01/18 0447  PROCALCITON 0.11  --   --   --   --   --   --   --   WBC  --   --   --  10.7* 11.8* 14.1*  --  14.0*  LATICACIDVEN  --  1.53 1.51  --  2.8*  --  0.9  --     Liver Function Tests: Recent Labs  Lab 11/28/18 1725  AST 28  ALT 23  ALKPHOS 84  BILITOT 0.7  PROT 7.2  ALBUMIN 3.7   Recent Labs  Lab 11/30/18 0244  LIPASE 24  AMYLASE 62   Recent Labs  Lab 11/28/18 1725  AMMONIA 55*    ABG    Component Value Date/Time   PHART 7.285 (L) 12/01/2018 0341   PCO2ART 38.7 12/01/2018 0341   PO2ART 81.0 (L) 12/01/2018 0341   HCO3 18.4 (L) 12/01/2018 0341   TCO2 20 (L) 12/01/2018 0341   ACIDBASEDEF 8.0 (H) 12/01/2018 0341   O2SAT 94.0 12/01/2018 0341     Coagulation Profile: Recent Labs  Lab 11/28/18 1658  INR 0.97    Cardiac Enzymes: Recent Labs  Lab 11/29/18 0042  TROPONINI 0.23*    HbA1C: Hgb A1c MFr Bld  Date/Time Value Ref Range Status  03/25/2017 05:59 AM 12.3 (H) 4.8 - 5.6 % Final    Comment:    (NOTE)         Pre-diabetes: 5.7 - 6.4         Diabetes: >6.4         Glycemic control for adults with diabetes: <7.0   12/28/2015 08:50 PM 11.7 (H) 4.8 - 5.6 %  Final    Comment:    (NOTE)         Pre-diabetes: 5.7 - 6.4         Diabetes: >6.4         Glycemic control for adults with diabetes: <7.0     CBG: Recent Labs  Lab 12/01/18 0015 12/01/18 0138 12/01/18 0244 12/01/18 0312 12/01/18 0824  GLUCAP 155* 164* 148* 136* 174*   Microbiology: all cultures negative at 48h. RVP + for H1N1 2009.  CXR: 1/6 personally  reviewed - stable bilateral interstitial pattern compatible with either pulmonary edema or viral pneumonia.  Echo (personally reviewed) - poor image quality.  EF visually 25% with regional wall motion abnormalities.    Assessment & Plan:   Critically ill due to acute hypoxic respiratory failure requiring mechanical ventilation. Influenza pneumonia  Negative bacterial cultures. ICM with regional wall motion abnormalities and worsening EF compared to prior. (was normal at [facility] 01/28/18) Clinically does not appear to have significant component of decompensated HF and BNP relatively low. AKI - improving.  PLAN  Attempt extubation today. Support with NIV if necessary.  Continue to monitor respiratory status as at high risk for reintubation. Diuresis as positive fluid balance. Follow creatinine Will need catheterization but will defer until creatinine improves.   Best practice:  Diet: NPO  - progressive diet post extubation. Pain/Anxiety/Delirium protocol (if indicated):  Sedation stopped. VAP protocol (if indicated): discontinued  DVT prophylaxis: Heparin bid GI prophylaxis: not indicated. Glucose control: T2Dm with adequate control on basal SSI Mobility: progressive mobility. Code Status: Full  Family Communication: no family present.  Disposition: Floor tomorrow if remains extubated.   Critical care time: 40 min spent including review of chart and imaging studies, examination of the patient. Adjustment of sedation, and evaluation for readiness for extubation and monitoring of respiratory status thereafter.    Lynnell Catalan, MD [facility] ICU Physician [facility]  Pager: [phone] Mobile: [phone] After hours: [phone].  12/01/2018, 10:07 AM

## 2018-12-01 NOTE — Progress Notes (Signed)
RT given orders to NTS pt due to WOB and crackles in chest and neck area.  RT assisted by RN, removed copious amounts of thickish white, milky secretions through NTS.  Pt tolerated fairly well but RN had to assist in holding pt's head due to pt thrashing his head back and forth.  RT then administered albuterol.  After tx and NTS, little to no change is noted except BS are a little clearer.  RT will continue to monitor.

## 2018-12-01 NOTE — Progress Notes (Signed)
Per patient's son, patient had a boil lanced from buttocks prior to patient's admission to the hospital. The area looks red/inflamed with with pus oozing out. Patient has also developed MASD over entire buttock area. WOC consult placed. Alwyn Ren, RN

## 2018-12-01 NOTE — Consult Note (Signed)
WOC Nurse wound consult note Reason for Consult:Boil, s/p I & D left gluteal fold with noted purulence.  Frequent, loose stools but too thick for fecal management to be appropriate Left gluteal fold partial thickness skin loss at apex of gluteal cleft.  Incontinence associated skin damage from loose, frequent stooling.  Wound type:Moisture and infectious boil, that has been I & D.  Pressure Injury POA: NA Measurement: 0.3 cm I & D site, too small for cotton tip applicator to explore or pack with packing strip.  No purulence noted at this time and may have been stool noted earlier.  Wound bed: Partial thickness epithelial sloughing to apex gluteal cleft on left side Drainage (amount, consistency, odor) minimal serosanguinous  No odor Periwound: Erytehma and nonblanchable erythema Dressing procedure/placement/frequency: Cleanse buttocks with soap and water twice daily and PRN incontinence soilage. Turn and reposition.  Avoid the use of disposable briefs and underpads and matress with low air loss feature has been ordered by bedside RN.  Will not follow at this time.  Please re-consult if needed.  [name] MSN, RN, FNP-BC CWON Wound, Ostomy, Continence Nurse Pager [phone]

## 2018-12-01 NOTE — Progress Notes (Signed)
150 cc of fentanyl wasted in hazardous container in med room. TK, RN witnessed.  Asher Muir Christin Moline,RN

## 2018-12-01 NOTE — Progress Notes (Signed)
eLink Physician-Brief Progress Note Patient Name: Antonio [name]. DOB: 02-16-1956 MRN: [id number]   Date of Service  12/01/2018  HPI/Events of Note  Called d/t increased WOB - Sat = 99% and fRR = 28.   eICU Interventions  Will order: 1. Albuterol 2.5 mg via neb now and Q 3 hours PRN SOB or wheezing.  2. NT suction PRN.      Intervention Category Major Interventions: Hypoxemia - evaluation and management  Sommer,Steven Dennard Nip 12/01/2018, 9:41 PM

## 2018-12-01 NOTE — Progress Notes (Signed)
Progress Note  Patient Name: Antonio [name]. Date of Encounter: 12/01/2018  Primary Cardiologist: Dr. Percival Spanish  Subjective     Inpatient Medications    Scheduled Meds: . aspirin  81 mg Per Tube Daily  . atorvastatin  40 mg Oral q1800  . chlorhexidine gluconate (MEDLINE KIT)  15 mL Mouth Rinse BID  . Chlorhexidine Gluconate Cloth  6 each Topical Daily  . enoxaparin (LOVENOX) injection  40 mg Subcutaneous Daily  . insulin aspart  2-6 Units Subcutaneous Q4H  . insulin detemir  12 Units Subcutaneous Q12H  . mouth rinse  15 mL Mouth Rinse 10 times per day  . oseltamivir  30 mg Per Tube BID  . sodium chloride flush  10-40 mL Intracatheter Q12H   Continuous Infusions: . ceFEPime (MAXIPIME) IV Stopped (11/30/18 2344)  . dextrose 5 % and 0.9% NaCl Stopped (12/01/18 0245)  . famotidine (PEPCID) IV 20 mg (12/01/18 0910)  . fentaNYL infusion INTRAVENOUS 200 mcg/hr (12/01/18 0800)  . norepinephrine (LEVOPHED) Adult infusion Stopped (12/01/18 0917)   PRN Meds: acetaminophen (TYLENOL) oral liquid 160 mg/5 mL, docusate, fentaNYL (SUBLIMAZE) injection, midazolam, sodium chloride flush   Vital Signs    Vitals:   12/01/18 0530 12/01/18 0600 12/01/18 0757 12/01/18 0800  BP: 97/62 102/84 124/79 135/87  Pulse: 87 91 87 87  Resp: (!) 28 18 (!) 28 (!) 28  Temp:    100.3 F (37.9 C)  TempSrc:    Axillary  SpO2: 92% 93% 96% 93%  Weight:      Height:        Intake/Output Summary (Last 24 hours) at 12/01/2018 1014 Last data filed at 12/01/2018 0800 Gross per 24 hour  Intake 2778.96 ml  Output 675 ml  Net 2103.96 ml   Filed Weights   11/29/18 0500 11/30/18 0404 12/01/18 0500  Weight: 104.5 kg 100.9 kg 100.5 kg    Telemetry     sinus tach - Personally Reviewed  ECG    N/A  Physical Exam   General appearance: alert and agitated, on mild sedation, intubated Neck: no carotid bruit, no JVD and thyroid not enlarged, symmetric, no tenderness/mass/nodules Lungs: diminished  breath sounds bilaterally and rales bilaterally Heart: regular tachycardia, no murmur Abdomen: soft, non-tender; bowel sounds normal; no masses,  no organomegaly Extremities: no edema, left BKA, s/p amputation of right 1-3 toes Pulses: 2+ and symmetric Skin: Skin color, texture, turgor normal. No rashes or lesions Neurologic: Mental status: intubated, sedated, agitated Psych: Cannot assess   Labs    Chemistry Recent Labs  Lab 11/28/18 1725  11/29/18 2228 11/30/18 0244 12/01/18 0447  NA  --    < > 136 136 136  K  --    < > 4.6 5.0 4.5  CL  --    < > 104 104 104  CO2  --    < > 20* 21* PENDING  GLUCOSE  --    < > 98 122* 98  BUN  --    < > 41* 42* 46*  CREATININE  --    < > 2.52* 2.31* 2.07*  CALCIUM  --    < > 8.6* 8.7* 7.7*  PROT 7.2  --   --   --   --   ALBUMIN 3.7  --   --   --   --   AST 28  --   --   --   --   ALT 23  --   --   --   --  ALKPHOS 84  --   --   --   --   BILITOT 0.7  --   --   --   --   GFRNONAA  --    < > 27* 30* 34*  GFRAA  --    < > 31* 34* 39*  ANIONGAP  --    < > 12 11 PENDING   < > = values in this interval not displayed.     Hematology Recent Labs  Lab 11/29/18 0332 11/30/18 0244 12/01/18 0447  WBC 11.8* 14.1* 14.0*  RBC 5.83* 5.99* 5.22  HGB 17.2* 17.3* 15.5  HCT 51.6 52.9* 48.3  MCV 88.5 88.3 92.5  MCH 29.5 28.9 29.7  MCHC 33.3 32.7 32.1  RDW 13.0 13.3 14.0  PLT 236 167 188    Cardiac Enzymes Recent Labs  Lab 11/29/18 0042  TROPONINI 0.23*    Recent Labs  Lab 11/28/18 1717 11/28/18 2037  TROPIPOC 0.03 0.04     BNP Recent Labs  Lab 11/28/18 1659  BNP 113.0*     DDimer  Recent Labs  Lab 11/28/18 2011 11/29/18 0332  DDIMER 0.66* 13.12*     Radiology    Dg Chest Port 1 View  Result Date: 12/01/2018 CLINICAL DATA:  Fever and cough.  Influenza EXAM: PORTABLE CHEST 1 VIEW COMPARISON:  Yesterday FINDINGS: Endotracheal tube tip just below the clavicular heads. An orogastric tube reaches the stomach. New PICC  with tip at the upper right atrium. Low volume chest with interstitial coarsening, stable. No focal consolidation. Normal heart size. IMPRESSION: 1. Stable low volume chest with mild interstitial opacity. 2. Unremarkable hardware positioning. Electronically Signed   By: Monte Fantasia M.D.   On: 12/01/2018 07:43   Dg Chest Port 1 View  Result Date: 11/30/2018 CLINICAL DATA:  Acute respiratory failure.  Hypoxia. EXAM: PORTABLE CHEST 1 VIEW COMPARISON:  11/29/2018 FINDINGS: Enteric tube courses through the stomach and off the film as tip is not visualized. Endotracheal tube has tip 6.6 cm above the carina. Lungs are adequately inflated without lobar consolidation or effusion. Subtle stable prominence of the perihilar markings. Cardiomediastinal silhouette and remainder of the exam is unchanged. IMPRESSION: Subtle hazy prominence of the perihilar markings which may be due to minimal vascular congestion or acute bronchitic process. Tubes and lines as described. Electronically Signed   By: Marin Olp M.D.   On: 11/30/2018 08:16   [place] Ekg Site Rite  Result Date: 11/30/2018 If Site Rite image not attached, placement could not be confirmed due to current cardiac rhythm.   Cardiac Studies   Echo 11/29/18 - Left ventricle: The cavity size was normal. Wall thickness was   normal. Systolic function was severely reduced. The estimated   ejection fraction was in the range of 20% to 25%. There is   akinesis of the anteroseptal, anterolateral, and apical   myocardium. Doppler parameters are consistent with abnormal left   ventricular relaxation (grade 1 diastolic dysfunction). - Pericardium, extracardiac: A small pericardial effusion was   identified circumferential to the heart. The fluid exhibited a   fibrinous appearance.There was no evidence of hemodynamic   compromise.  Patient Profile     63 y.o. male with history of CAD, reduced EF (40% on lexiscan 04/2018) who presented with respiratory failure due  to influenza. Cardiology following for further reduction in EF and elevated troponin.  Assessment & Plan    Respiratory failure in the setting of influenza: still intermittently febrile, intubated, on pressors.  -as  per PCCM, on Tamiflu  Acute on chronic systolic heart failure: EF depressed to 20-25% on echo. Last EF prior was on lexiscan stress, noted as 40% -Echo now with LVEF 20-25% - there are regional WMA's - would advise invasive ischemia evaluation at some point pending improvement in renal function and pulmonary function. - Transduce CVP - JVP does not appear elevated, may have some pulmonary edema on x-ray - He is 4L positive - consider lasix prior to extubation.  History of CAD: Lexiscan 04/2018 with EF 40% and fixed perfusion defect in inferior and inferolateral area, consistent with infarct. Possible peri-infarct ischemia. -troponins mildly elevated, not surprising given his critical illness -likely demand ischemia secondary to CHF, suspect myocarditis is less likely -possible ischemia evaluation later in the week. -personally reviewed the echo today, there are regional WMA's.  Small (mostly posterior) pericardial effusion: no echo evidence of hemodynamic compromise.  Pixie Casino, MD, [facility], [place] Director of the Advanced Lipid Disorders &  Cardiovascular Risk Reduction Clinic Diplomate of the American Board of Clinical Lipidology Attending Cardiologist  Direct Dial: [phone]  Fax: [phone]  Website:  [email or web address]  Pixie Casino, MD  12/01/2018, 10:14 AM

## 2018-12-01 NOTE — Procedures (Signed)
Extubation Procedure Note  Patient Details:   Name: Antonio [name]. DOB: [birth year] MRN: [id number]   Airway Documentation:    Vent end date: 12/01/18 Vent end time: 1043   Evaluation  O2 sats: stable throughout Complications: No apparent complications Patient did tolerate procedure well. Bilateral Breath Sounds: Diminished   Yes   Pt extubated to 4L N/C.  No stridor noted.  RN @ bedside.  Christophe Louis 12/01/2018, 10:43 AM

## 2018-12-01 NOTE — Progress Notes (Signed)
eLink Physician-Brief Progress Note Patient Name: Antonio [name]. DOB: [birth year] MRN: [id number]   Date of Service  12/01/2018  HPI/Events of Note  Extubated today. Mental status not good enough to take PO meds. Patient is on Tamiflu.  eICU Interventions  Will order: 1. Place NGT to LIS.      Intervention Category Major Interventions: Other:  Lenell Antu 12/01/2018, 7:14 PM

## 2018-12-01 NOTE — Progress Notes (Signed)
Patient with heart rate in the mid 120s, diaphoretic, hypertensive, with restlessness. Per Dr. Denese Killings, give one time dose of 50 mcg fentanyl. Dose given as ordered. No change noted in patients vitals or behavior. Will notify Dr. Denese Killings. Will continue to monitor. Asher Muir Anberlyn Feimster,RN

## 2018-12-02 ENCOUNTER — Inpatient Hospital Stay (HOSPITAL_COMMUNITY): Payer: Medicaid Other

## 2018-12-02 DIAGNOSIS — J9601 Acute respiratory failure with hypoxia: Secondary | ICD-10-CM

## 2018-12-02 DIAGNOSIS — I5043 Acute on chronic combined systolic (congestive) and diastolic (congestive) heart failure: Secondary | ICD-10-CM

## 2018-12-02 LAB — BASIC METABOLIC PANEL
Anion gap: 11 (ref 5–15)
BUN: 44 mg/dL — ABNORMAL HIGH (ref 6–20)
CO2: 20 mmol/L — ABNORMAL LOW (ref 22–32)
Calcium: 8.1 mg/dL — ABNORMAL LOW (ref 8.9–10.3)
Chloride: 109 mmol/L (ref 98–111)
Creatinine, Ser: 1.3 mg/dL — ABNORMAL HIGH (ref 0.61–1.24)
GFR calc Af Amer: >60 mL/min (ref >60)
GFR, EST NON AFRICAN AMERICAN: 59 mL/min — AB (ref >60)
Glucose, Bld: 196 mg/dL — ABNORMAL HIGH (ref 70–99)
Potassium: 5.2 mmol/L — ABNORMAL HIGH (ref 3.5–5.1)
Sodium: 140 mmol/L (ref 135–145)

## 2018-12-02 LAB — GLUCOSE, CAPILLARY
Glucose-Capillary: 131 mg/dL — ABNORMAL HIGH (ref 70–99)
Glucose-Capillary: 141 mg/dL — ABNORMAL HIGH (ref 70–99)
Glucose-Capillary: 169 mg/dL — ABNORMAL HIGH (ref 70–99)
Glucose-Capillary: 191 mg/dL — ABNORMAL HIGH (ref 70–99)
Glucose-Capillary: 238 mg/dL — ABNORMAL HIGH (ref 70–99)
Glucose-Capillary: 311 mg/dL — ABNORMAL HIGH (ref 70–99)
Glucose-Capillary: 348 mg/dL — ABNORMAL HIGH (ref 70–99)

## 2018-12-02 LAB — POCT I-STAT 3, ART BLOOD GAS (G3+)
Acid-base deficit: 5 mmol/L — ABNORMAL HIGH (ref 0.0–2.0)
Acid-base deficit: 6 mmol/L — ABNORMAL HIGH (ref 0.0–2.0)
Bicarbonate: 21.2 mmol/L (ref 20.0–28.0)
Bicarbonate: 19.9 mmol/L — ABNORMAL LOW (ref 20.0–28.0)
O2 Saturation: 95 %
O2 Saturation: 87 %
Patient temperature: 99.1
Patient temperature: 98.6
TCO2: 23 mmol/L (ref 22–32)
TCO2: 21 mmol/L — ABNORMAL LOW (ref 22–32)
pCO2 arterial: 45.3 mmHg (ref 32.0–48.0)
pCO2 arterial: 37.5 mmHg (ref 32.0–48.0)
pH, Arterial: 7.281 — ABNORMAL LOW (ref 7.350–7.450)
pH, Arterial: 7.332 — ABNORMAL LOW (ref 7.350–7.450)
pO2, Arterial: 84 mmHg (ref 83.0–108.0)
pO2, Arterial: 56 mmHg — ABNORMAL LOW (ref 83.0–108.0)

## 2018-12-02 LAB — HEPATIC FUNCTION PANEL
ALBUMIN: 2.3 g/dL — AB (ref 3.5–5.0)
ALT: 49 U/L — ABNORMAL HIGH (ref 0–44)
AST: 101 U/L — ABNORMAL HIGH (ref 15–41)
Alkaline Phosphatase: 68 U/L (ref 38–126)
BILIRUBIN TOTAL: 0.9 mg/dL (ref 0.3–1.2)
Bilirubin, Direct: 0.1 mg/dL (ref 0.0–0.2)
Indirect Bilirubin: 0.8 mg/dL (ref 0.3–0.9)
Total Protein: 6.1 g/dL — ABNORMAL LOW (ref 6.5–8.1)

## 2018-12-02 LAB — AMMONIA: Ammonia: 29 umol/L (ref 9–35)

## 2018-12-02 MED ORDER — MIDAZOLAM HCL 2 MG/2ML IJ SOLN
INTRAMUSCULAR | Status: AC
  Filled 2018-12-02: qty 2

## 2018-12-02 MED ORDER — FUROSEMIDE 10 MG/ML IJ SOLN
80.0000 mg | Freq: Once | INTRAMUSCULAR | Status: AC
  Administered 2018-12-02: 80 mg via INTRAVENOUS
  Filled 2018-12-02: qty 8

## 2018-12-02 MED ORDER — FENTANYL CITRATE (PF) 100 MCG/2ML IJ SOLN
INTRAMUSCULAR | Status: AC
  Administered 2018-12-02: 100 ug
  Filled 2018-12-02: qty 2

## 2018-12-02 MED ORDER — BETHANECHOL CHLORIDE 10 MG PO TABS
10.0000 mg | ORAL_TABLET | Freq: Three times a day (TID) | ORAL | Status: DC
  Administered 2018-12-02 – 2018-12-09 (×19): 10 mg
  Filled 2018-12-02 (×20): qty 1

## 2018-12-02 MED ORDER — ADULT MULTIVITAMIN W/MINERALS CH
1.0000 | ORAL_TABLET | Freq: Every day | ORAL | Status: DC
  Administered 2018-12-02 – 2018-12-10 (×8): 1
  Filled 2018-12-02 (×9): qty 1

## 2018-12-02 MED ORDER — MIDAZOLAM HCL 2 MG/2ML IJ SOLN
2.0000 mg | Freq: Once | INTRAMUSCULAR | Status: AC
  Administered 2018-12-02: 2 mg via INTRAVENOUS

## 2018-12-02 MED ORDER — ALBUTEROL SULFATE (2.5 MG/3ML) 0.083% IN NEBU
2.5000 mg | INHALATION_SOLUTION | RESPIRATORY_TRACT | Status: DC
  Administered 2018-12-01 – 2018-12-03 (×7): 2.5 mg via RESPIRATORY_TRACT
  Filled 2018-12-02 (×6): qty 3

## 2018-12-02 MED ORDER — FENTANYL CITRATE (PF) 100 MCG/2ML IJ SOLN
50.0000 ug | Freq: Once | INTRAMUSCULAR | Status: AC
  Administered 2018-12-02: 50 ug via INTRAVENOUS

## 2018-12-02 MED ORDER — SUCCINYLCHOLINE CHLORIDE 20 MG/ML IJ SOLN
100.0000 mg | Freq: Once | INTRAMUSCULAR | Status: AC
  Administered 2018-12-02: 100 mg via INTRAVENOUS
  Filled 2018-12-02: qty 5

## 2018-12-02 MED ORDER — INSULIN GLARGINE 100 UNIT/ML ~~LOC~~ SOLN
10.0000 [IU] | Freq: Every day | SUBCUTANEOUS | Status: DC
  Administered 2018-12-03: 10 [IU] via SUBCUTANEOUS
  Filled 2018-12-02: qty 0.1

## 2018-12-02 MED ORDER — INSULIN ASPART 100 UNIT/ML ~~LOC~~ SOLN
0.0000 [IU] | SUBCUTANEOUS | Status: DC
  Administered 2018-12-03: 11 [IU] via SUBCUTANEOUS

## 2018-12-02 MED ORDER — INSULIN ASPART 100 UNIT/ML ~~LOC~~ SOLN
8.0000 [IU] | SUBCUTANEOUS | Status: DC
  Administered 2018-12-02 – 2018-12-03 (×3): 8 [IU] via SUBCUTANEOUS

## 2018-12-02 MED ORDER — VITAL HIGH PROTEIN PO LIQD
1000.0000 mL | ORAL | Status: DC
  Administered 2018-12-02: 1000 mL

## 2018-12-02 MED ORDER — VITAL 1.5 CAL PO LIQD
1000.0000 mL | ORAL | Status: DC
  Administered 2018-12-03 – 2018-12-10 (×8): 1000 mL
  Filled 2018-12-02 (×10): qty 1000

## 2018-12-02 MED ORDER — PRO-STAT SUGAR FREE PO LIQD
60.0000 mL | Freq: Two times a day (BID) | ORAL | Status: DC
  Administered 2018-12-02 – 2019-01-09 (×75): 60 mL
  Filled 2018-12-02 (×75): qty 60

## 2018-12-02 MED ORDER — TAMSULOSIN HCL 0.4 MG PO CAPS: 0.4000 mg | ORAL_CAPSULE | Freq: Every day | ORAL | Status: DC

## 2018-12-02 MED ORDER — PANTOPRAZOLE SODIUM 40 MG PO PACK
40.0000 mg | PACK | Freq: Every day | ORAL | Status: DC
  Administered 2018-12-02: 40 mg
  Filled 2018-12-02: qty 20

## 2018-12-02 MED ORDER — FENTANYL BOLUS VIA INFUSION
50.0000 ug | INTRAVENOUS | Status: DC | PRN
  Administered 2018-12-02 – 2018-12-03 (×2): 50 ug via INTRAVENOUS
  Filled 2018-12-02: qty 50

## 2018-12-02 MED ORDER — ETOMIDATE 2 MG/ML IV SOLN
60.0000 mg | Freq: Once | INTRAVENOUS | Status: AC
  Administered 2018-12-02: 60 mg via INTRAVENOUS

## 2018-12-02 MED ORDER — INSULIN ASPART 100 UNIT/ML ~~LOC~~ SOLN
0.0000 [IU] | SUBCUTANEOUS | Status: DC
  Administered 2018-12-02 (×2): 15 [IU] via SUBCUTANEOUS

## 2018-12-02 MED ORDER — FENTANYL 2500MCG IN NS 250ML (10MCG/ML) PREMIX INFUSION
25.0000 ug/h | INTRAVENOUS | Status: DC
  Administered 2018-12-02: 200 ug/h via INTRAVENOUS
  Administered 2018-12-02: 150 ug/h via INTRAVENOUS
  Filled 2018-12-02 (×2): qty 250

## 2018-12-02 NOTE — Progress Notes (Signed)
NAME:  Antonio Eckhardt., MRN:  [id number], DOB:  [birth year], LOS: 4 ADMISSION DATE:  11/28/2018, CONSULTATION DATE:  11/28/18 REFERRING MD:  Katrinka Blazing - ED [facility], CHIEF COMPLAINT:  Respiratory failure.   HPI/course in hospital  63 year old with fever, cough and chest pain and SOB. Intubated in ED. Influenza A positive.   Initially work up for NSTEMI with intermediate troponin rise.  Found to have worsened EF to 20% with prior known EF 40% by Lexiscan. Extubated 1/6, reintubated 1/7 AM  Past Medical History   Past Medical History:  Diagnosis Date  . Coronary artery disease   . Diabetes mellitus without complication (HCC)   . Hyperlipidemia   . Hypertension   . Osteomyelitis (HCC) 03/25/2017   RT FOOT    Past Surgical History:  Procedure Laterality Date  . AMPUTATION Right 03/27/2017   Procedure: 1st Ray Amputation Right Foot;  Surgeon: Nadara Mustard, MD;  Location: [facility] OR;  Service: Orthopedics;  Laterality: Right;  . APPENDECTOMY    . CARDIAC CATHETERIZATION    . CORONARY STENT INTERVENTION  2005  . Great toe amputation right.    . I&D EXTREMITY Left 12/30/2015   Procedure: IRRIGATION AND DEBRIDEMENT LEFT FOOT, TRANSMETATARSAL AMPUTATION WITH APPLICATION OF ANTIBIOTIC BEADS AND WOUND VAC;  Surgeon: Nadara Mustard, MD;  Location: [facility] OR;  Service: Orthopedics;  Laterality: Left;  . TONSILLECTOMY      Interim history/subjective:  Extubated yesterday however developed respiratory distress, agitation, pooling secretions so reintubated this morning.  Per RN has not had any secretions or required frequent suctioning this morning.  Able to nod yes/no and denies complaints though is reaching for ETT when not restrained  Objective   Blood pressure 113/64, pulse 73, temperature 99.1 F (37.3 C), temperature source Axillary, resp. rate 20, height 6' (1.829 m), weight 98 kg, SpO2 96 %. CVP:  [11 mmHg] 11 mmHg  Vent Mode: PRVC FiO2 (%):  [40 %-60 %] 50 % Set Rate:  [20 bmp] 20 bmp Vt Set:   [620 mL] 620 mL PEEP:  [5 cmH20] 5 cmH20 Plateau Pressure:  [17 cmH20-22 cmH20] 17 cmH20   Intake/Output Summary (Last 24 hours) at 12/02/2018 0854 Last data filed at 12/02/2018 0600 Gross per 24 hour  Intake 651.51 ml  Output 1710 ml  Net -1058.49 ml   Filed Weights   11/30/18 0404 12/01/18 0500 12/02/18 0500  Weight: 100.9 kg 100.5 kg 98 kg    Examination: Physical Exam  Constitutional: Vital signs are normal. He appears well-developed and well-nourished. He is cooperative. He is sedated and intubated.  HENT:  Head: Normocephalic and atraumatic.  Eyes: Pupils are equal, round, and reactive to light.  Cardiovascular: Normal rate and normal heart sounds. Exam reveals no gallop.  No murmur heard. Respiratory: Effort normal and breath sounds normal. He is intubated.  On 40% fio2 and 5 PEEP  GI: Soft.  Musculoskeletal:     Comments: L BKA. R toe amputation.  Neurological: He is alert.  Following commands in all 4 extremities  Skin: Skin is dry.  No edema     Ancillary tests (personally reviewed)  CBC: Recent Labs  Lab 11/28/18 1658  11/28/18 2037 11/29/18 0042 11/29/18 0332 11/30/18 0244 12/01/18 0447  WBC 6.8  --   --  10.7* 11.8* 14.1* 14.0*  HGB 17.2*   < > 15.0 17.1* 17.2* 17.3* 15.5  HCT 52.4*   < > 44.0 51.8 51.6 52.9* 48.3  MCV 88.2  --   --  90.6 88.5 88.3 92.5  PLT 163  --   --  231 236 167 188   < > = values in this interval not displayed.    Basic Metabolic Panel: Recent Labs  Lab 11/28/18 2013  11/29/18 0042 11/29/18 0332 11/29/18 0717 11/29/18 2228 11/30/18 0244 12/01/18 0447 12/02/18 0530  NA  --    < > 130*  --   --  136 136 136 140  K  --    < > 5.0  --   --  4.6 5.0 4.5 5.2*  CL  --    < > 99  --   --  104 104 104 109  CO2  --   --  16*  --   --  20* 21* 19* 20*  GLUCOSE  --    < > 454*  --  572* 98 122* 98 196*  BUN  --    < > 23*  --   --  41* 42* 46* 44*  CREATININE  --    < > 1.33* 1.60*  --  2.52* 2.31* 2.07* 1.30*  CALCIUM  --    --  8.5*  --   --  8.6* 8.7* 7.7* 8.1*  MG 1.7  --   --   --   --  2.7*  --   --   --   PHOS  --   --   --   --   --  5.6*  --   --   --    < > = values in this interval not displayed.   GFR: Estimated Creatinine Clearance: 73.3 mL/min (A) (by C-G formula based on SCr of 1.3 mg/dL (H)). Recent Labs  Lab 11/28/18 1725 11/28/18 1759 11/28/18 2036 11/29/18 0042 11/29/18 0332 11/30/18 0244 11/30/18 1901 12/01/18 0447  PROCALCITON 0.11  --   --   --   --   --   --   --   WBC  --   --   --  10.7* 11.8* 14.1*  --  14.0*  LATICACIDVEN  --  1.53 1.51  --  2.8*  --  0.9  --     Liver Function Tests: Recent Labs  Lab 11/28/18 1725 12/02/18 0530  AST 28 101*  ALT 23 49*  ALKPHOS 84 68  BILITOT 0.7 0.9  PROT 7.2 6.1*  ALBUMIN 3.7 2.3*   Recent Labs  Lab 11/30/18 0244  LIPASE 24  AMYLASE 62   Recent Labs  Lab 11/28/18 1725 12/02/18 0530  AMMONIA 55* 29    ABG    Component Value Date/Time   PHART 7.332 (L) 12/02/2018 0513   PCO2ART 37.5 12/02/2018 0513   PO2ART 56.0 (L) 12/02/2018 0513   HCO3 19.9 (L) 12/02/2018 0513   TCO2 21 (L) 12/02/2018 0513   ACIDBASEDEF 6.0 (H) 12/02/2018 0513   O2SAT 87.0 12/02/2018 0513     Coagulation Profile: Recent Labs  Lab 11/28/18 1658  INR 0.97    Cardiac Enzymes: Recent Labs  Lab 11/29/18 0042  TROPONINI 0.23*    HbA1C: Hgb A1c MFr Bld  Date/Time Value Ref Range Status  03/25/2017 05:59 AM 12.3 (H) 4.8 - 5.6 % Final    Comment:    (NOTE)         Pre-diabetes: 5.7 - 6.4         Diabetes: >6.4         Glycemic control for adults with diabetes: <7.0   12/28/2015 08:50 PM 11.7 (  H) 4.8 - 5.6 % Final    Comment:    (NOTE)         Pre-diabetes: 5.7 - 6.4         Diabetes: >6.4         Glycemic control for adults with diabetes: <7.0     CBG: Recent Labs  Lab 12/01/18 1212 12/01/18 2021 12/02/18 0001 12/02/18 0341 12/02/18 0833  GLUCAP 156* 105* 131* 169* 191*   Microbiology: all cultures negative at 48h.  RVP + for H1N1 2009.  CXR: 1/6 personally reviewed - stable bilateral interstitial pattern compatible with either pulmonary edema or viral pneumonia.  CXR 1/7: appropriate ETT. Similar basilar interstitial infiltrates  Echo (personally reviewed) - poor image quality.  EF visually 25% with regional wall motion abnormalities.    Assessment & Plan:   Critically ill due to acute hypoxic respiratory failure requiring mechanical ventilation. Influenza pneumonia  Negative bacterial cultures. ICM with regional wall motion abnormalities and worsening EF compared to prior. (was normal at [facility] 01/28/18 however 40% 04/2018) Clinically does not appear to have significant component of decompensated HF and BNP relatively low. AKI - improving. Left gluteal fold abscess s/p I&D PTA  PLAN  reintubated for respiratory distress 1/7. Will aggressively diurese, continue tamiflu. norepinephrine requiring decreasing, no fevers, new infiltrates, or other evidence to suggest bacterial superinfection however will follow up on tracheal aspirate from 1/7 and have a low threshold to start empiric antibiotics if any worsening. He appears otherwise well and should have another trial of extubation prior to discussion of tracheostomy Needs ischemic eval once renal/pulmonary function improved per Cardiology AKI improving with diuresis, continue as above Per wound care, cleanse buttocks with soap and water twice daily and PRN incontinence soilage   Best practice:  Diet: start tube feeds if not extubated by ~1/8 Pain/Anxiety/Delirium protocol (if indicated):  Fentanyl, precedex VAP protocol (if indicated): ordered DVT prophylaxis: lovenox GI prophylaxis: PPI Glucose control: T2Dm with adequate control on basal SSI Mobility: progressive mobility once extubated Code Status: Full  Family Communication: no family present.  Disposition: ICU   Critical care time: 30 min spent including review of chart and imaging studies,  examination of the patient.     [place] Cayde Held, MD [facility] Pulmonology/Critical Care Pager [phone] After hours pager: [phone]   12/02/2018, 8:54 AM

## 2018-12-02 NOTE — Progress Notes (Signed)
PCCM INTERVAL PROGRESS NOTE  Called to bedside by respiratory therapy to evaluate patient for respiratory distress. Briefly, this is a 63 year old male who was admitted for a flu pneumonia requiring intubation. He improved and was able to be extubated, however, he is now tachypneic and paradoxical requiring 6L supplemental O2. He is pooling secretions in his posterior pharynx. He is also rather agitated and has pulled out his NGT, not able to cooperate with care. Not following commands.   Plan: - Intubate - Full vent support - CXR - Follow ABG - Titrate FiO2 to keep SpO2 > 92%  Joneen Roach, AGACNP-BC [facility] Pulmonary/Critical Care Pager [phone] or [phone]  12/02/2018 4:01 AM

## 2018-12-02 NOTE — Progress Notes (Signed)
Per family, patient seen at urgent care a few days prior to admission for boil on buttocks. Several areas of concern noted on buttocks. Family states patient had been dealing with this issues for a while and the providers were able to "pop" it at urgent care. Will pass on to MD and continue to assess.

## 2018-12-02 NOTE — Procedures (Signed)
Endotracheal Intubation Procedure Note  Indication for endotracheal intubation: impending respiratory failure and altered mental status, inability to protect airway. Airway Assessment: Mallampati Class: II (hard and soft palate, upper portion of tonsils anduvula visible). Sedation: etomidate and midazolam. (etomidate 30 mg, Versed 2 mg) Paralytic: succinylcholine. (succinylcholine 100 mg) Lidocaine: no. Atropine: no. Equipment: Macintosh 4 laryngoscope blade, 7.8mm cuffed endotracheal tube and GlideScope. 24 cm at teeth Cricoid Pressure: yes. Number of attempts: 1. ETT location confirmed by by auscultation, ETCO2 monitor and CXR pending.  Marcelle Smiling, MD Board Certified by the ABIM, Pulmonary Diseases & Critical Care Medicine  12/02/2018

## 2018-12-02 NOTE — Progress Notes (Signed)
Re-intubation noted for respiratory distress/airway protection. Agree with diuresis. D/w Dr. Denese Killings yesterday - not stable for cath at this point, but would pursue [facility] possible later in the week or next week depending on progress and improvement in renal function. Cardiology will follow peripherally. Call if needed.  Chrystie Nose, MD, [facility], FACP  [place]  [facility]  Medical Director of the [facility]  Cardiovascular Risk Reduction Clinic Diplomate of the American Board of Clinical Lipidology Attending Cardiologist  Direct Dial: [phone]  Fax: [phone]  Website:  [email or web address]

## 2018-12-02 NOTE — Progress Notes (Signed)
Spoke with CCM about Foley for urinary retention.  Urecholine started, foley will remain in place.

## 2018-12-02 NOTE — Progress Notes (Signed)
Nutrition Follow-up  DOCUMENTATION CODES:   Not applicable  INTERVENTION:   D/C Vital High Protein  Vital 1.5 @ 50 ml/hr (1200 ml/day) via OG tube Continue 60 ml Prostat BID  Provides: 2200 kcal, 141 grams protein, and 916 ml free water.    NUTRITION DIAGNOSIS:   Inadequate oral intake related to inability to eat as evidenced by NPO status. Ongoing  GOAL:   Provide needs based on ASPEN/SCCM guidelines Progressing  MONITOR:   Vent status, Labs, Skin, Weight trends, I & O's  REASON FOR ASSESSMENT:   Consult Enteral/tube feeding initiation and management  ASSESSMENT:   63 y/o M [facility] with fever, cough, chest pain & SOB. Pt found to be Influenza A positive. Developed progressive respiratory distress requiring intubation in the ER.    Pt discussed during ICU rounds and with RN.  Per MD AKI improving with diuresis Cardiology following with worsening EF  Spoke with CCM, will re-start TF since pt is now re-intubated  1/6 extubated 1/7 re-intubated early am  Patient is currently intubated on ventilator support MV: 17 L/min Temp (24hrs), Avg:99.8 F (37.7 C), Min:98.7 F (37.1 C), Max:101.1 F (38.4 C)  Labs and medications reviewed.  8 units novolog every 4 hrs (added 1/7), SSI, MVI No pressors CBG: 348  Diet Order:   Diet Order            Diet NPO time specified  Diet effective now              EDUCATION NEEDS:   Not appropriate for education at this time  Skin:  Skin Assessment: Skin Integrity Issues: Skin Integrity Issues:: Stage II Stage II: sacrum  Last BM:  1/7 x 3 small-medium  Height:   Ht Readings from Last 1 Encounters:  11/29/18 6' (1.829 m)    Weight:   Wt Readings from Last 1 Encounters:  12/02/18 98 kg    Ideal Body Weight:  76.1 kg(adjusted for L BKA)  BMI:  Body mass index is 29.3 kg/m. Adjusted BMI: 30.3  Estimated Nutritional Needs:   Kcal:  2252  Protein:  120-150 grams  Fluid:  > 2  L/day  Kendell BaneHeather Barry Culverhouse RD, LDN, CNSC [phone] Pager [phone] After Hours Pager

## 2018-12-02 NOTE — Progress Notes (Signed)
eLink Physician-Brief Progress Note Patient Name: Antonio [name]. DOB: 1956-08-26 MRN: [id number]   Date of Service  12/02/2018  HPI/Events of Note  Elevated blood sugars on resistant ssi every 4hrs and on tube feeds  eICU Interventions  10 units lantus added HS     Intervention Category Major Interventions: Hyperglycemia - active titration of insulin therapy  Henry Russel, P 12/02/2018, 11:01 PM

## 2018-12-03 DIAGNOSIS — J101 Influenza due to other identified influenza virus with other respiratory manifestations: Secondary | ICD-10-CM

## 2018-12-03 DIAGNOSIS — R079 Chest pain, unspecified: Secondary | ICD-10-CM

## 2018-12-03 LAB — GLUCOSE, CAPILLARY
GLUCOSE-CAPILLARY: 315 mg/dL — AB (ref 70–99)
Glucose-Capillary: 110 mg/dL — ABNORMAL HIGH (ref 70–99)
Glucose-Capillary: 125 mg/dL — ABNORMAL HIGH (ref 70–99)
Glucose-Capillary: 146 mg/dL — ABNORMAL HIGH (ref 70–99)
Glucose-Capillary: 149 mg/dL — ABNORMAL HIGH (ref 70–99)
Glucose-Capillary: 153 mg/dL — ABNORMAL HIGH (ref 70–99)
Glucose-Capillary: 154 mg/dL — ABNORMAL HIGH (ref 70–99)
Glucose-Capillary: 157 mg/dL — ABNORMAL HIGH (ref 70–99)
Glucose-Capillary: 158 mg/dL — ABNORMAL HIGH (ref 70–99)
Glucose-Capillary: 173 mg/dL — ABNORMAL HIGH (ref 70–99)
Glucose-Capillary: 249 mg/dL — ABNORMAL HIGH (ref 70–99)
Glucose-Capillary: 252 mg/dL — ABNORMAL HIGH (ref 70–99)
Glucose-Capillary: 258 mg/dL — ABNORMAL HIGH (ref 70–99)
Glucose-Capillary: 300 mg/dL — ABNORMAL HIGH (ref 70–99)
Glucose-Capillary: 305 mg/dL — ABNORMAL HIGH (ref 70–99)
Glucose-Capillary: 306 mg/dL — ABNORMAL HIGH (ref 70–99)

## 2018-12-03 LAB — CULTURE, BLOOD (ROUTINE X 2)
Culture: NO GROWTH
Culture: NO GROWTH
Special Requests: ADEQUATE
Special Requests: ADEQUATE

## 2018-12-03 LAB — HEPATIC FUNCTION PANEL
ALT: 39 U/L (ref 0–44)
AST: 42 U/L — ABNORMAL HIGH (ref 15–41)
Albumin: 2.2 g/dL — ABNORMAL LOW (ref 3.5–5.0)
Alkaline Phosphatase: 73 U/L (ref 38–126)
BILIRUBIN TOTAL: 0.6 mg/dL (ref 0.3–1.2)
Bilirubin, Direct: 0.1 mg/dL (ref 0.0–0.2)
Indirect Bilirubin: 0.5 mg/dL (ref 0.3–0.9)
Total Protein: 6.3 g/dL — ABNORMAL LOW (ref 6.5–8.1)

## 2018-12-03 LAB — BASIC METABOLIC PANEL
Anion gap: 6 (ref 5–15)
BUN: 52 mg/dL — ABNORMAL HIGH (ref 6–20)
CO2: 24 mmol/L (ref 22–32)
Calcium: 7.3 mg/dL — ABNORMAL LOW (ref 8.9–10.3)
Chloride: 111 mmol/L (ref 98–111)
Creatinine, Ser: 0.94 mg/dL (ref 0.61–1.24)
GFR calc Af Amer: >60 mL/min (ref >60)
GFR calc non Af Amer: >60 mL/min (ref >60)
Glucose, Bld: 279 mg/dL — ABNORMAL HIGH (ref 70–99)
Potassium: 3.7 mmol/L (ref 3.5–5.1)
Sodium: 141 mmol/L (ref 135–145)

## 2018-12-03 MED ORDER — FUROSEMIDE 10 MG/ML IJ SOLN
40.0000 mg | Freq: Once | INTRAMUSCULAR | Status: AC
  Administered 2018-12-03: 40 mg via INTRAVENOUS
  Filled 2018-12-03: qty 4

## 2018-12-03 MED ORDER — IPRATROPIUM-ALBUTEROL 0.5-2.5 (3) MG/3ML IN SOLN
3.0000 mL | Freq: Three times a day (TID) | RESPIRATORY_TRACT | Status: DC
  Administered 2018-12-03 – 2018-12-29 (×78): 3 mL via RESPIRATORY_TRACT
  Filled 2018-12-03 (×77): qty 3

## 2018-12-03 MED ORDER — FAMOTIDINE 40 MG/5ML PO SUSR
20.0000 mg | Freq: Two times a day (BID) | ORAL | Status: DC
  Administered 2018-12-04 – 2019-02-20 (×157): 20 mg
  Filled 2018-12-03 (×158): qty 2.5

## 2018-12-03 MED ORDER — SODIUM CHLORIDE 0.9 % IV SOLN
INTRAVENOUS | Status: DC | PRN
  Administered 2018-12-03: 500 mL via INTRAVENOUS
  Administered 2018-12-05: 250 mL via INTRAVENOUS

## 2018-12-03 MED ORDER — INSULIN DETEMIR 100 UNIT/ML ~~LOC~~ SOLN
15.0000 [IU] | Freq: Two times a day (BID) | SUBCUTANEOUS | Status: DC
  Administered 2018-12-03 – 2018-12-04 (×2): 15 [IU] via SUBCUTANEOUS
  Filled 2018-12-03 (×2): qty 0.15

## 2018-12-03 MED ORDER — INSULIN ASPART 100 UNIT/ML ~~LOC~~ SOLN
2.0000 [IU] | SUBCUTANEOUS | Status: DC
  Administered 2018-12-04: 4 [IU] via SUBCUTANEOUS

## 2018-12-03 MED ORDER — THIAMINE HCL 100 MG/ML IJ SOLN
100.0000 mg | Freq: Every day | INTRAMUSCULAR | Status: DC
  Administered 2018-12-03 – 2018-12-05 (×3): 100 mg via INTRAVENOUS
  Filled 2018-12-03 (×3): qty 2

## 2018-12-03 MED ORDER — FOLIC ACID 5 MG/ML IJ SOLN
1.0000 mg | Freq: Every day | INTRAMUSCULAR | Status: DC
  Administered 2018-12-03 – 2018-12-05 (×3): 1 mg via INTRAVENOUS
  Filled 2018-12-03 (×3): qty 0.2

## 2018-12-03 MED ORDER — POTASSIUM CHLORIDE 20 MEQ/15ML (10%) PO SOLN: 40.0000 meq | Freq: Once | ORAL | Status: DC

## 2018-12-03 MED ORDER — ORAL CARE MOUTH RINSE: 15.0000 mL | Freq: Two times a day (BID) | OROMUCOSAL | Status: DC

## 2018-12-03 MED ORDER — CHLORHEXIDINE GLUCONATE 0.12 % MT SOLN
15.0000 mL | Freq: Two times a day (BID) | OROMUCOSAL | Status: DC
  Administered 2018-12-04 – 2018-12-06 (×5): 15 mL via OROMUCOSAL
  Filled 2018-12-03 (×5): qty 15

## 2018-12-03 MED ORDER — INSULIN REGULAR(HUMAN) IN NACL 100-0.9 UT/100ML-% IV SOLN
INTRAVENOUS | Status: DC
  Administered 2018-12-03: 2.6 [IU]/h via INTRAVENOUS
  Filled 2018-12-03: qty 100

## 2018-12-03 MED ORDER — POTASSIUM CHLORIDE 10 MEQ/50ML IV SOLN
10.0000 meq | INTRAVENOUS | Status: AC
  Administered 2018-12-03 (×2): 10 meq via INTRAVENOUS
  Filled 2018-12-03 (×2): qty 50

## 2018-12-03 MED ORDER — COLLAGENASE 250 UNIT/GM EX OINT
TOPICAL_OINTMENT | Freq: Every day | CUTANEOUS | Status: AC
  Administered 2018-12-03 – 2018-12-15 (×13): via TOPICAL
  Administered 2018-12-16: 1 via TOPICAL
  Filled 2018-12-03 (×3): qty 30

## 2018-12-03 NOTE — Progress Notes (Signed)
75 cc fentanyl wasted in sink. Witnessed by Lysle Rubens

## 2018-12-03 NOTE — Progress Notes (Signed)
Pt trying to get out of bed and increasingly agitated. Multiple verbal interactions made with pt.  Requested posey belt order to be placed by MD McQuaid.

## 2018-12-03 NOTE — Progress Notes (Addendum)
NAME:  Antonio Fancher., MRN:  [id number], DOB:  1956-03-29, LOS: 5 ADMISSION DATE:  11/28/2018, CONSULTATION DATE:  11/28/18 REFERRING MD:  Tamala Julian - ED [facility], CHIEF COMPLAINT:  Respiratory failure.   HPI/course in hospital  63 year old with fever, cough and chest pain and SOB. Intubated in ED. Influenza A positive.   Initially work up for NSTEMI with intermediate troponin rise.  Found to have worsened EF to 20% with prior known EF 40% by Lexiscan. Extubated 1/6, reintubated 1/7 AM for respiratory distress  Past Medical History   Past Medical History:  Diagnosis Date  . Coronary artery disease   . Diabetes mellitus without complication ([place])   . Hyperlipidemia   . Hypertension   . Osteomyelitis ([place]) 03/25/2017   RT FOOT    Past Surgical History:  Procedure Laterality Date  . AMPUTATION Right 03/27/2017   Procedure: 1st Ray Amputation Right Foot;  Surgeon: Newt Minion, MD;  Location: [place];  Service: Orthopedics;  Laterality: Right;  . APPENDECTOMY    . CARDIAC CATHETERIZATION    . CORONARY STENT INTERVENTION  2005  . Great toe amputation right.    . I&D EXTREMITY Left 12/30/2015   Procedure: IRRIGATION AND DEBRIDEMENT LEFT FOOT, TRANSMETATARSAL AMPUTATION WITH APPLICATION OF ANTIBIOTIC BEADS AND WOUND VAC;  Surgeon: Newt Minion, MD;  Location: [place];  Service: Orthopedics;  Laterality: Left;  . TONSILLECTOMY      Interim history/subjective:  Diuresed -1.2 L from Lasix 65m yest.  Remains net +2L Tmax 101.2 Placed on Insulin gtt overnight Remains on levophed 2 mcg/min, precedex 0.7 and fentanyl 200->125 mcg/min Continues to reach for ETT requiring restraints and bite block Weaned PSV 8/5 x 2 hour this am, placed back for agitation/ decreasing O2 90%  No UOP since void this am 0100  Objective   Blood pressure 107/66, pulse 69, temperature (!) 100.7 F (38.2 C), temperature source Axillary, resp. rate 20, height 6' (1.829 m), weight 97.7 kg, SpO2 94 %.    Vent Mode:  CPAP;PSV FiO2 (%):  [40 %-50 %] 40 % Set Rate:  [20 bmp] 20 bmp Vt Set:  [[956mL] 620 mL PEEP:  [5 cmH20] 5 cmH20 Pressure Support:  [8 cmH20] 8 cmH20 Plateau Pressure:  [16 cmH20-20 cmH20] 18 cmH20   Intake/Output Summary (Last 24 hours) at 12/03/2018 0903 Last data filed at 12/03/2018 0600 Gross per 24 hour  Intake 1505.83 ml  Output 2860 ml  Net -1354.17 ml   Filed Weights   12/01/18 0500 12/02/18 0500 12/03/18 0500  Weight: 100.5 kg 98 kg 97.7 kg    Examination: General:  Critically ill adult male on MV with mild/ moderate agitation HEENT: MM pink/moist, pupils 3/reactive, ETT at 24, OGT, bite block currently in place Neuro: Awake, tracks, agitated, does not follow commands CV:  rrr, no m/r/g PULM: even/tachypneic 28 on PSV 8/5, lungs bilaterally clear GI: soft, bs active, +BM loose but not completely liquid, tender to palpation over suprapubic area- condom cath in place Extremities: warm/dry, L BKA, R great toe amputation, no edema Skin: no rashes, multiple sacral pressure sores   Ancillary tests (personally reviewed)  CBC: Recent Labs  Lab 11/28/18 1658  11/28/18 2037 11/29/18 0042 11/29/18 0332 11/30/18 0244 12/01/18 0447  WBC 6.8  --   --  10.7* 11.8* 14.1* 14.0*  HGB 17.2*   < > 15.0 17.1* 17.2* 17.3* 15.5  HCT 52.4*   < > 44.0 51.8 51.6 52.9* 48.3  MCV 88.2  --   --  90.6 88.5 88.3 92.5  PLT 163  --   --  231 236 167 188   < > = values in this interval not displayed.    Basic Metabolic Panel: Recent Labs  Lab 11/28/18 2013  11/29/18 2228 11/30/18 0244 12/01/18 0447 12/02/18 0530 12/03/18 0500  NA  --    < > 136 136 136 140 141  K  --    < > 4.6 5.0 4.5 5.2* 3.7  CL  --    < > 104 104 104 109 111  CO2  --    < > 20* 21* 19* 20* 24  GLUCOSE  --    < > 98 122* 98 196* 279*  BUN  --    < > 41* 42* 46* 44* 52*  CREATININE  --    < > 2.52* 2.31* 2.07* 1.30* 0.94  CALCIUM  --    < > 8.6* 8.7* 7.7* 8.1* 7.3*  MG 1.7  --  2.7*  --   --   --   --   PHOS   --   --  5.6*  --   --   --   --    < > = values in this interval not displayed.   GFR: Estimated Creatinine Clearance: 101.2 mL/min (by C-G formula based on SCr of 0.94 mg/dL). Recent Labs  Lab 11/28/18 1725 11/28/18 1759 11/28/18 2036 11/29/18 0042 11/29/18 0332 11/30/18 0244 11/30/18 1901 12/01/18 0447  PROCALCITON 0.11  --   --   --   --   --   --   --   WBC  --   --   --  10.7* 11.8* 14.1*  --  14.0*  LATICACIDVEN  --  1.53 1.51  --  2.8*  --  0.9  --     Liver Function Tests: Recent Labs  Lab 11/28/18 1725 12/02/18 0530  AST 28 101*  ALT 23 49*  ALKPHOS 84 68  BILITOT 0.7 0.9  PROT 7.2 6.1*  ALBUMIN 3.7 2.3*   Recent Labs  Lab 11/30/18 0244  LIPASE 24  AMYLASE 62   Recent Labs  Lab 11/28/18 1725 12/02/18 0530  AMMONIA 55* 29    ABG    Component Value Date/Time   PHART 7.332 (L) 12/02/2018 0513   PCO2ART 37.5 12/02/2018 0513   PO2ART 56.0 (L) 12/02/2018 0513   HCO3 19.9 (L) 12/02/2018 0513   TCO2 21 (L) 12/02/2018 0513   ACIDBASEDEF 6.0 (H) 12/02/2018 0513   O2SAT 87.0 12/02/2018 0513     Coagulation Profile: Recent Labs  Lab 11/28/18 1658  INR 0.97    Cardiac Enzymes: Recent Labs  Lab 11/29/18 0042  TROPONINI 0.23*    HbA1C: Hgb A1c MFr Bld  Date/Time Value Ref Range Status  03/25/2017 05:59 AM 12.3 (H) 4.8 - 5.6 % Final    Comment:    (NOTE)         Pre-diabetes: 5.7 - 6.4         Diabetes: >6.4         Glycemic control for adults with diabetes: <7.0   12/28/2015 08:50 PM 11.7 (H) 4.8 - 5.6 % Final    Comment:    (NOTE)         Pre-diabetes: 5.7 - 6.4         Diabetes: >6.4         Glycemic control for adults with diabetes: <7.0     CBG: Recent Labs  Lab  12/03/18 0404 12/03/18 0505 12/03/18 0604 12/03/18 0723 12/03/18 0818  GLUCAP 258* 315* 305* 252* 306*   Microbiology:  1/3 RVP >> Flu A H1N1 2009 1/4 MRSA PCR >> neg 1/4 UC >> neg 1/3 Blood >> neg 1/7 trach asp >>  CXR: 1/6  - stable bilateral  interstitial pattern compatible with either pulmonary edema or viral pneumonia.  CXR 1/7: appropriate ETT. Similar basilar interstitial infiltrates  Echo 1/4  - poor image quality.  EF visually 25% with regional wall motion abnormalities.    1/3 ETT >>1/6; 1/7 >> 1/5 TL PICC >>  Assessment & Plan:   Critically ill due to acute hypoxic respiratory failure requiring mechanical ventilation. Influenza pneumonia  P:  Continue full MV support PRVC 8cc/kg Daily SBT trials VAP bundle Keen even/ negative I/O balance Day 5 of 5 of oseltamivir  Wean fentanyl gtt as able/ continue precedex for RASS goal 0/-1 with daily wake up assessments; consider taper with clonidine    Hypotension - related to sepsis +/- component of sedation needs - negative cultures thus far P:  Wean levophed for MAP > 65- almost off  Limit sedation as needed for RASS goal 0/-1 If fever ongoing consider re-pan culture Cbc in am    ICM with regional wall motion abnormalities and worsening EF compared to prior. (was normal at [facility] 01/28/18 however 40% 04/2018) P:  Tele monitoring Goal K > 4; Mag > 2 Goal even balance  Cards have seen, will need R/L HC when clinical status has improved  Additional lasix 40 mg x 1 and KLC    AKI - improving. Monitor for urinary retention P:  D/c D5NS IVF at 50 ml/h Bladder scan PRN, may need catheter Trend renal function   Hyperglycemia/ IDDM P:  Continue insulin gtt per protocol, transition when criteria met    Left gluteal fold abscess s/p I&D PTA Sacral pressure wound P:  Wound care consult   Acute delirium/ ? ETOH use P  Start empiric thiamine/ folate/ MVI- unclear ETOH hx  Continue precedex   Mild Transaminitis P:  Recheck LFTs in am    Best practice:  Diet: start tube feeds if not extubated by ~1/8 Pain/Anxiety/Delirium protocol (if indicated):  Fentanyl, precedex  VAP protocol (if indicated): ordered DVT prophylaxis: lovenox GI prophylaxis: PPI  changed to per tube Glucose control:  Insulin gtt Mobility: progressive mobility once extubated Code Status: Full  Family Communication: no family present 1/8 Disposition: ICU   Critical care time: 45 mins     Kennieth Rad, MSN, AGACNP-BC [place] Pulmonary & Critical Care Pgr: [phone] or if no answer [phone] 12/03/2018, 10:10 AM

## 2018-12-03 NOTE — Evaluation (Signed)
Clinical/Bedside Swallow Evaluation Patient Details  Name: Antonio RichardsDaniel Watson Peto Jr. MRN: [id number] Date of Birth: 03/21/1956  Today's Date: 12/03/2018 Time: SLP Start Time (ACUTE ONLY): 1628 SLP Stop Time (ACUTE ONLY): 1640 SLP Time Calculation (min) (ACUTE ONLY): 12 min  Past Medical History:  Past Medical History:  Diagnosis Date  . Coronary artery disease   . Diabetes mellitus without complication (HCC)   . Hyperlipidemia   . Hypertension   . Osteomyelitis (HCC) 03/25/2017   RT FOOT   Past Surgical History:  Past Surgical History:  Procedure Laterality Date  . AMPUTATION Right 03/27/2017   Procedure: 1st Ray Amputation Right Foot;  Surgeon: Nadara MustardMarcus Duda V, MD;  [facility];  Service: Orthopedics;  Laterality: Right;  . APPENDECTOMY    . CARDIAC CATHETERIZATION    . CORONARY STENT INTERVENTION  2005  . Great toe amputation right.    . I&D EXTREMITY Left 12/30/2015   Procedure: IRRIGATION AND DEBRIDEMENT LEFT FOOT, TRANSMETATARSAL AMPUTATION WITH APPLICATION OF ANTIBIOTIC BEADS AND WOUND VAC;  Surgeon: Nadara MustardMarcus Duda V, MD;  Location: [facility] OR;  Service: Orthopedics;  Laterality: Left;  . TONSILLECTOMY     HPI:  63 year old male coming in with fever cough chest pain and shortness of breath was found to have influenza A. Intubated 1/3-1/8. PMH: diabetes mellitus, hypertension. CXR 1/7 Low volume chest with interstitial coarsening and streaky basilar density. No effusion or pneumothorax.   Assessment / Plan / Recommendation Clinical Impression  Vocal quality and intensity is significantly hoarse and low at baseline following self extubation this morning with fair-strong volitional cough. Airway appears compromised likely from 6 day intubation marked by immediate cough with thin water and applesauce with good prognosis for advancement to po's after time off vent. Pt should remain NPO with oral care and ST will continue to see and make appropriate recommendations.     SLP Visit Diagnosis:  Dysphagia, unspecified (R13.10)    Aspiration Risk  Severe aspiration risk    Diet Recommendation NPO   Medication Administration: Via alternative means    Other  Recommendations Oral Care Recommendations: Oral care QID   Follow up Recommendations (TBD)      Frequency and Duration min 2x/week  2 weeks       Prognosis Prognosis for Safe Diet Advancement: Good Barriers to Reach Goals: Cognitive deficits      Swallow Study   General HPI: 63 year old male coming in with fever cough chest pain and shortness of breath was found to have influenza A. Intubated 1/3-1/8. PMH: diabetes mellitus, hypertension. CXR 1/7 Low volume chest with interstitial coarsening and streaky basilar density. No effusion or pneumothorax. Type of Study: Bedside Swallow Evaluation Previous Swallow Assessment: (none) Diet Prior to this Study: NPO Temperature Spikes Noted: No Respiratory Status: Nasal cannula History of Recent Intubation: Yes Length of Intubations (days): 6 days Date extubated: 12/03/18 Behavior/Cognition: Alert;Requires cueing Oral Cavity Assessment: (ulcer on hard palate) Oral Care Completed by SLP: No Oral Cavity - Dentition: Adequate natural dentition Vision: Functional for self-feeding Self-Feeding Abilities: Able to feed self Patient Positioning: Upright in bed Baseline Vocal Quality: Hoarse;Low vocal intensity Volitional Cough: Strong Volitional Swallow: Able to elicit    Oral/Motor/Sensory Function Overall Oral Motor/Sensory Function: Within functional limits   Ice Chips Ice chips: Not tested   Thin Liquid Thin Liquid: Impaired Presentation: Cup Oral Phase Impairments: Reduced labial seal Oral Phase Functional Implications: Right anterior spillage;Left anterior spillage Pharyngeal  Phase Impairments: Cough - Immediate    Nectar Thick Nectar Thick  Liquid: Not tested   Honey Thick Honey Thick Liquid: Not tested   Puree Puree: Impaired Pharyngeal Phase Impairments: Cough -  Immediate   Solid     Solid: Not tested      Royce Macadamia 12/03/2018,5:17 PM  Breck Coons [place].Ed Nurse, children's [phone] Office [phone]

## 2018-12-03 NOTE — Progress Notes (Signed)
Informed of self extubation.  Patient is currently maintaining his airway on [place] 6L, mild tachypnea 24 and increased work of breathing.   P:  Monitor for now, very high risk for reintubation HFNC as needed to maintain sats > 92% D/c fent gtt, continue precedex as needed for agitation    Posey Boyer, MSN, AGACNP-BC [facility] Pgr: [phone] or if no answer [phone] 12/03/2018, 10:03 AM

## 2018-12-03 NOTE — Consult Note (Signed)
WOC Nurse wound consult note Reason for Consult:right proximal medial buttock unstageable Wound type:pressure Pressure Injury POA: Yes Measurement: 3cm x 4.5cm x unknown depth Wound bed:90% black 10% pink Drainage (amount, consistency, odor) scant serosanguinous  Periwound: reddened from frequent stools Dressing procedure/placement/frequency:I have provided nurses with orders for Santyl enzymatic debridement wound care daily. Positioning to keep pressure off this site. Nutritional level is poor, pt is on protein supplement. Pt has self extubated just prior to my visit, he is demanding water and not very compliant with visit and certainly not open to any teaching of positioning to prevent pressure to this area. We will not follow, but will remain available to this patient, to nursing, and the medical and/or surgical teams.  Please re-consult if we need to assist further.  Barnett Hatter, RN-C, WTA-C, OCA Wound Treatment Associate Ostomy Care Associate

## 2018-12-04 ENCOUNTER — Inpatient Hospital Stay (HOSPITAL_COMMUNITY): Payer: Medicaid Other

## 2018-12-04 LAB — PHOSPHORUS
Phosphorus: 3 mg/dL (ref 2.5–4.6)
Phosphorus: 1.3 mg/dL — ABNORMAL LOW (ref 2.5–4.6)

## 2018-12-04 LAB — CBC
HCT: 51.9 % (ref 39.0–52.0)
Hemoglobin: 16.4 g/dL (ref 13.0–17.0)
MCH: 29.2 pg (ref 26.0–34.0)
MCHC: 31.6 g/dL (ref 30.0–36.0)
MCV: 92.5 fL (ref 80.0–100.0)
Platelets: 201 10*3/uL (ref 150–400)
RBC: 5.61 MIL/uL (ref 4.22–5.81)
RDW: 13.2 % (ref 11.5–15.5)
WBC: 8.3 10*3/uL (ref 4.0–10.5)
nRBC: 0 % (ref 0.0–0.2)

## 2018-12-04 LAB — GLUCOSE, CAPILLARY
Glucose-Capillary: 162 mg/dL — ABNORMAL HIGH (ref 70–99)
Glucose-Capillary: 224 mg/dL — ABNORMAL HIGH (ref 70–99)
Glucose-Capillary: 233 mg/dL — ABNORMAL HIGH (ref 70–99)
Glucose-Capillary: 266 mg/dL — ABNORMAL HIGH (ref 70–99)
Glucose-Capillary: 269 mg/dL — ABNORMAL HIGH (ref 70–99)
Glucose-Capillary: 376 mg/dL — ABNORMAL HIGH (ref 70–99)
Glucose-Capillary: 463 mg/dL — ABNORMAL HIGH (ref 70–99)

## 2018-12-04 LAB — RENAL FUNCTION PANEL
Albumin: 2.3 g/dL — ABNORMAL LOW (ref 3.5–5.0)
Anion gap: 10 (ref 5–15)
BUN: 41 mg/dL — ABNORMAL HIGH (ref 6–20)
CALCIUM: 8.8 mg/dL — AB (ref 8.9–10.3)
CO2: 22 mmol/L (ref 22–32)
Chloride: 115 mmol/L — ABNORMAL HIGH (ref 98–111)
Creatinine, Ser: 0.84 mg/dL (ref 0.61–1.24)
GFR calc Af Amer: >60 mL/min (ref >60)
GFR calc non Af Amer: >60 mL/min (ref >60)
Glucose, Bld: 258 mg/dL — ABNORMAL HIGH (ref 70–99)
Phosphorus: 3.2 mg/dL (ref 2.5–4.6)
Potassium: 4.6 mmol/L (ref 3.5–5.1)
SODIUM: 147 mmol/L — AB (ref 135–145)

## 2018-12-04 LAB — MAGNESIUM
MAGNESIUM: 2.4 mg/dL (ref 1.7–2.4)
Magnesium: 2.5 mg/dL — ABNORMAL HIGH (ref 1.7–2.4)
Magnesium: 2.6 mg/dL — ABNORMAL HIGH (ref 1.7–2.4)

## 2018-12-04 LAB — POCT I-STAT 3, ART BLOOD GAS (G3+)
Acid-Base Excess: 5 mmol/L — ABNORMAL HIGH (ref 0.0–2.0)
Bicarbonate: 29.7 mmol/L — ABNORMAL HIGH (ref 20.0–28.0)
O2 Saturation: 100 %
PCO2 ART: 42.5 mmHg (ref 32.0–48.0)
PO2 ART: 190 mmHg — AB (ref 83.0–108.0)
Patient temperature: 98.9
TCO2: 31 mmol/L (ref 22–32)
pH, Arterial: 7.453 — ABNORMAL HIGH (ref 7.350–7.450)

## 2018-12-04 LAB — CULTURE, RESPIRATORY W GRAM STAIN: Culture: NORMAL

## 2018-12-04 LAB — CULTURE, RESPIRATORY

## 2018-12-04 MED ORDER — ORAL CARE MOUTH RINSE
15.0000 mL | OROMUCOSAL | Status: DC
  Administered 2018-12-04 – 2019-01-02 (×272): 15 mL via OROMUCOSAL

## 2018-12-04 MED ORDER — ALBUTEROL SULFATE (2.5 MG/3ML) 0.083% IN NEBU
2.5000 mg | INHALATION_SOLUTION | RESPIRATORY_TRACT | Status: DC | PRN
  Administered 2018-12-16 – 2019-02-05 (×3): 2.5 mg via RESPIRATORY_TRACT
  Filled 2018-12-04 (×3): qty 3

## 2018-12-04 MED ORDER — FENTANYL 2500MCG IN NS 250ML (10MCG/ML) PREMIX INFUSION
0.0000 ug/h | INTRAVENOUS | Status: DC
  Administered 2018-12-04: 200 ug/h via INTRAVENOUS
  Administered 2018-12-05: 250 ug/h via INTRAVENOUS
  Administered 2018-12-05: 400 ug/h via INTRAVENOUS
  Administered 2018-12-05 – 2018-12-06 (×2): 300 ug/h via INTRAVENOUS
  Administered 2018-12-06 (×2): 350 ug/h via INTRAVENOUS
  Administered 2018-12-07: 300 ug/h via INTRAVENOUS
  Administered 2018-12-07: 250 ug/h via INTRAVENOUS
  Administered 2018-12-07: 300 ug/h via INTRAVENOUS
  Administered 2018-12-08 (×2): 200 ug/h via INTRAVENOUS
  Administered 2018-12-09: 175 ug/h via INTRAVENOUS
  Administered 2018-12-10 – 2018-12-11 (×2): 150 ug/h via INTRAVENOUS
  Administered 2018-12-11: 200 ug/h via INTRAVENOUS
  Filled 2018-12-04 (×17): qty 250

## 2018-12-04 MED ORDER — SODIUM BICARBONATE 8.4 % IV SOLN
INTRAVENOUS | Status: AC
  Administered 2018-12-04: 12:00:00
  Filled 2018-12-04: qty 50

## 2018-12-04 MED ORDER — CHLORHEXIDINE GLUCONATE 0.12% ORAL RINSE (MEDLINE KIT)
15.0000 mL | Freq: Two times a day (BID) | OROMUCOSAL | Status: DC
  Administered 2018-12-04 – 2019-02-01 (×110): 15 mL via OROMUCOSAL

## 2018-12-04 MED ORDER — PHENYLEPHRINE HCL-NACL 10-0.9 MG/250ML-% IV SOLN
INTRAVENOUS | Status: AC
  Filled 2018-12-04: qty 250

## 2018-12-04 MED ORDER — VITAL HIGH PROTEIN PO LIQD: 1000.0000 mL | ORAL | Status: DC

## 2018-12-04 MED ORDER — DEXMEDETOMIDINE HCL IN NACL 400 MCG/100ML IV SOLN
0.0000 ug/kg/h | INTRAVENOUS | Status: AC
  Administered 2018-12-04: 1.2 ug/kg/h via INTRAVENOUS
  Administered 2018-12-05: 1.1 ug/kg/h via INTRAVENOUS
  Administered 2018-12-05 (×3): 1.2 ug/kg/h via INTRAVENOUS
  Administered 2018-12-05: 1.1 ug/kg/h via INTRAVENOUS
  Administered 2018-12-05: 1 ug/kg/h via INTRAVENOUS
  Administered 2018-12-05: 1.2 ug/kg/h via INTRAVENOUS
  Administered 2018-12-06: 1 ug/kg/h via INTRAVENOUS
  Administered 2018-12-06: 1.2 ug/kg/h via INTRAVENOUS
  Administered 2018-12-06: 1 ug/kg/h via INTRAVENOUS
  Administered 2018-12-06 (×3): 1.2 ug/kg/h via INTRAVENOUS
  Administered 2018-12-06: 0.8 ug/kg/h via INTRAVENOUS
  Administered 2018-12-07 (×3): 1 ug/kg/h via INTRAVENOUS
  Administered 2018-12-07: 0.8 ug/kg/h via INTRAVENOUS
  Administered 2018-12-07: 1 ug/kg/h via INTRAVENOUS
  Filled 2018-12-04 (×19): qty 100

## 2018-12-04 MED ORDER — DEXMEDETOMIDINE HCL IN NACL 200 MCG/50ML IV SOLN: 0.0000 ug/kg/h | INTRAVENOUS | Status: DC

## 2018-12-04 MED ORDER — NOREPINEPHRINE BITARTRATE 1 MG/ML IV SOLN
0.0000 ug/min | INTRAVENOUS | Status: DC
  Administered 2018-12-04: 2 ug/min via INTRAVENOUS
  Administered 2018-12-04 – 2018-12-05 (×2): 10 ug/min via INTRAVENOUS
  Filled 2018-12-04 (×5): qty 4

## 2018-12-04 MED ORDER — PRO-STAT SUGAR FREE PO LIQD: 30.0000 mL | Freq: Two times a day (BID) | ORAL | Status: DC

## 2018-12-04 MED ORDER — LABETALOL HCL 5 MG/ML IV SOLN
10.0000 mg | INTRAVENOUS | Status: DC | PRN
  Administered 2018-12-04 (×2): 10 mg via INTRAVENOUS
  Filled 2018-12-04 (×2): qty 4

## 2018-12-04 MED ORDER — FENTANYL CITRATE (PF) 100 MCG/2ML IJ SOLN
INTRAMUSCULAR | Status: AC
  Filled 2018-12-04: qty 2

## 2018-12-04 MED ORDER — INSULIN ASPART 100 UNIT/ML ~~LOC~~ SOLN
25.0000 [IU] | Freq: Once | SUBCUTANEOUS | Status: AC
  Administered 2018-12-05: 25 [IU] via SUBCUTANEOUS

## 2018-12-04 MED ORDER — ETOMIDATE 2 MG/ML IV SOLN
20.0000 mg | Freq: Once | INTRAVENOUS | Status: AC
  Administered 2018-12-04: 20 mg via INTRAVENOUS

## 2018-12-04 MED ORDER — INSULIN DETEMIR 100 UNIT/ML ~~LOC~~ SOLN
15.0000 [IU] | Freq: Once | SUBCUTANEOUS | Status: AC
  Administered 2018-12-04: 15 [IU] via SUBCUTANEOUS
  Filled 2018-12-04: qty 0.15

## 2018-12-04 MED ORDER — ROCURONIUM BROMIDE 50 MG/5ML IV SOLN
90.0000 mg | Freq: Once | INTRAVENOUS | Status: AC
  Administered 2018-12-04: 90 mg via INTRAVENOUS

## 2018-12-04 MED ORDER — ENALAPRILAT 1.25 MG/ML IV SOLN
1.2500 mg | Freq: Four times a day (QID) | INTRAVENOUS | Status: DC
  Administered 2018-12-04: 1.25 mg via INTRAVENOUS
  Filled 2018-12-04: qty 1

## 2018-12-04 MED ORDER — INSULIN ASPART 100 UNIT/ML ~~LOC~~ SOLN: 3.0000 [IU] | SUBCUTANEOUS | Status: DC

## 2018-12-04 MED ORDER — DEXMEDETOMIDINE HCL IN NACL 200 MCG/50ML IV SOLN
0.0000 ug/kg/h | INTRAVENOUS | Status: DC
  Administered 2018-12-04: 1.2 ug/kg/h via INTRAVENOUS
  Administered 2018-12-04: 1 ug/kg/h via INTRAVENOUS
  Administered 2018-12-04 (×3): 1.2 ug/kg/h via INTRAVENOUS
  Filled 2018-12-04 (×3): qty 50
  Filled 2018-12-04: qty 100
  Filled 2018-12-04: qty 50

## 2018-12-04 MED ORDER — MIDAZOLAM HCL 2 MG/2ML IJ SOLN
INTRAMUSCULAR | Status: AC
  Filled 2018-12-04: qty 4

## 2018-12-04 MED ORDER — INSULIN ASPART 100 UNIT/ML ~~LOC~~ SOLN
0.0000 [IU] | SUBCUTANEOUS | Status: DC
  Administered 2018-12-04 (×3): 7 [IU] via SUBCUTANEOUS
  Administered 2018-12-04: 11 [IU] via SUBCUTANEOUS
  Administered 2018-12-04 – 2018-12-05 (×2): 20 [IU] via SUBCUTANEOUS
  Administered 2018-12-05: 11 [IU] via SUBCUTANEOUS
  Administered 2018-12-05: 15 [IU] via SUBCUTANEOUS
  Administered 2018-12-05: 20 [IU] via SUBCUTANEOUS
  Administered 2018-12-05 – 2018-12-06 (×2): 11 [IU] via SUBCUTANEOUS
  Administered 2018-12-06: 7 [IU] via SUBCUTANEOUS
  Administered 2018-12-06 (×2): 11 [IU] via SUBCUTANEOUS
  Administered 2018-12-06 (×2): 7 [IU] via SUBCUTANEOUS
  Administered 2018-12-06: 4 [IU] via SUBCUTANEOUS
  Administered 2018-12-07: 7 [IU] via SUBCUTANEOUS
  Administered 2018-12-07: 4 [IU] via SUBCUTANEOUS
  Administered 2018-12-07 (×2): 7 [IU] via SUBCUTANEOUS
  Administered 2018-12-07 – 2018-12-08 (×3): 3 [IU] via SUBCUTANEOUS
  Administered 2018-12-08: 4 [IU] via SUBCUTANEOUS
  Administered 2018-12-08: 3 [IU] via SUBCUTANEOUS
  Administered 2018-12-08 – 2018-12-09 (×5): 4 [IU] via SUBCUTANEOUS
  Administered 2018-12-10 (×2): 3 [IU] via SUBCUTANEOUS
  Administered 2018-12-11: 7 [IU] via SUBCUTANEOUS
  Administered 2018-12-11: 4 [IU] via SUBCUTANEOUS
  Administered 2018-12-11: 3 [IU] via SUBCUTANEOUS
  Administered 2018-12-12: 4 [IU] via SUBCUTANEOUS
  Administered 2018-12-12: 7 [IU] via SUBCUTANEOUS
  Administered 2018-12-12: 11 [IU] via SUBCUTANEOUS
  Administered 2018-12-12: 4 [IU] via SUBCUTANEOUS
  Administered 2018-12-12: 11 [IU] via SUBCUTANEOUS
  Administered 2018-12-12: 7 [IU] via SUBCUTANEOUS
  Administered 2018-12-13: 3 [IU] via SUBCUTANEOUS
  Administered 2018-12-13 (×3): 4 [IU] via SUBCUTANEOUS
  Administered 2018-12-14 (×3): 3 [IU] via SUBCUTANEOUS
  Administered 2018-12-14 (×2): 4 [IU] via SUBCUTANEOUS
  Administered 2018-12-14: 3 [IU] via SUBCUTANEOUS
  Administered 2018-12-15: 4 [IU] via SUBCUTANEOUS
  Administered 2018-12-15: 11 [IU] via SUBCUTANEOUS
  Administered 2018-12-15: 3 [IU] via SUBCUTANEOUS
  Administered 2018-12-16 – 2018-12-17 (×4): 4 [IU] via SUBCUTANEOUS
  Administered 2018-12-17: 3 [IU] via SUBCUTANEOUS
  Administered 2018-12-17 (×2): 4 [IU] via SUBCUTANEOUS
  Administered 2018-12-17: 3 [IU] via SUBCUTANEOUS
  Administered 2018-12-18: 4 [IU] via SUBCUTANEOUS
  Administered 2018-12-18 (×2): 3 [IU] via SUBCUTANEOUS
  Administered 2018-12-18: 4 [IU] via SUBCUTANEOUS
  Administered 2018-12-19 (×3): 3 [IU] via SUBCUTANEOUS
  Administered 2018-12-20 (×3): 4 [IU] via SUBCUTANEOUS
  Administered 2018-12-20: 3 [IU] via SUBCUTANEOUS
  Administered 2018-12-21 (×2): 4 [IU] via SUBCUTANEOUS
  Administered 2018-12-21 (×2): 3 [IU] via SUBCUTANEOUS
  Administered 2018-12-21: 4 [IU] via SUBCUTANEOUS
  Administered 2018-12-21 – 2018-12-22 (×2): 7 [IU] via SUBCUTANEOUS
  Administered 2018-12-22 (×2): 4 [IU] via SUBCUTANEOUS
  Administered 2018-12-23: 3 [IU] via SUBCUTANEOUS
  Administered 2018-12-23: 4 [IU] via SUBCUTANEOUS
  Administered 2018-12-24 (×2): 7 [IU] via SUBCUTANEOUS
  Administered 2018-12-24: 3 [IU] via SUBCUTANEOUS
  Administered 2018-12-25: 4 [IU] via SUBCUTANEOUS
  Administered 2018-12-25: 7 [IU] via SUBCUTANEOUS
  Administered 2018-12-25 (×2): 4 [IU] via SUBCUTANEOUS
  Administered 2018-12-26 (×3): 3 [IU] via SUBCUTANEOUS
  Administered 2018-12-27: 7 [IU] via SUBCUTANEOUS
  Administered 2018-12-27: 4 [IU] via SUBCUTANEOUS
  Administered 2018-12-27: 11 [IU] via SUBCUTANEOUS
  Administered 2018-12-28: 3 [IU] via SUBCUTANEOUS
  Administered 2018-12-28 (×2): 4 [IU] via SUBCUTANEOUS
  Administered 2018-12-28: 3 [IU] via SUBCUTANEOUS
  Administered 2018-12-28 – 2018-12-29 (×2): 7 [IU] via SUBCUTANEOUS
  Administered 2018-12-29: 4 [IU] via SUBCUTANEOUS
  Administered 2018-12-29: 3 [IU] via SUBCUTANEOUS
  Administered 2018-12-29 – 2018-12-30 (×4): 4 [IU] via SUBCUTANEOUS
  Administered 2018-12-30: 7 [IU] via SUBCUTANEOUS
  Administered 2018-12-30 – 2018-12-31 (×3): 4 [IU] via SUBCUTANEOUS
  Administered 2018-12-31: 3 [IU] via SUBCUTANEOUS
  Administered 2018-12-31 (×2): 4 [IU] via SUBCUTANEOUS
  Administered 2019-01-01: 7 [IU] via SUBCUTANEOUS
  Administered 2019-01-01: 11 [IU] via SUBCUTANEOUS
  Administered 2019-01-01 (×2): 7 [IU] via SUBCUTANEOUS
  Administered 2019-01-01: 4 [IU] via SUBCUTANEOUS
  Administered 2019-01-01 – 2019-01-02 (×3): 7 [IU] via SUBCUTANEOUS
  Administered 2019-01-02 – 2019-01-03 (×5): 4 [IU] via SUBCUTANEOUS
  Administered 2019-01-03: 9 [IU] via SUBCUTANEOUS
  Administered 2019-01-03: 3 [IU] via SUBCUTANEOUS
  Administered 2019-01-03: 7 [IU] via SUBCUTANEOUS
  Administered 2019-01-04: 4 [IU] via SUBCUTANEOUS
  Administered 2019-01-04: 3 [IU] via SUBCUTANEOUS
  Administered 2019-01-04: 7 [IU] via SUBCUTANEOUS
  Administered 2019-01-04: 3 [IU] via SUBCUTANEOUS
  Administered 2019-01-04: 7 [IU] via SUBCUTANEOUS
  Administered 2019-01-04 – 2019-01-05 (×2): 4 [IU] via SUBCUTANEOUS
  Administered 2019-01-05 (×3): 3 [IU] via SUBCUTANEOUS
  Administered 2019-01-05: 11 [IU] via SUBCUTANEOUS
  Administered 2019-01-05 – 2019-01-06 (×3): 4 [IU] via SUBCUTANEOUS
  Administered 2019-01-06: 3 [IU] via SUBCUTANEOUS
  Administered 2019-01-06 – 2019-01-07 (×4): 4 [IU] via SUBCUTANEOUS
  Administered 2019-01-07: 3 [IU] via SUBCUTANEOUS
  Administered 2019-01-07: 4 [IU] via SUBCUTANEOUS
  Administered 2019-01-08: 3 [IU] via SUBCUTANEOUS
  Administered 2019-01-08: 4 [IU] via SUBCUTANEOUS
  Administered 2019-01-08: 3 [IU] via SUBCUTANEOUS
  Administered 2019-01-08: 4 [IU] via SUBCUTANEOUS
  Administered 2019-01-08: 3 [IU] via SUBCUTANEOUS
  Administered 2019-01-08: 4 [IU] via SUBCUTANEOUS
  Administered 2019-01-09 (×2): 3 [IU] via SUBCUTANEOUS
  Administered 2019-01-10 (×2): 4 [IU] via SUBCUTANEOUS
  Administered 2019-01-10 (×2): 7 [IU] via SUBCUTANEOUS
  Administered 2019-01-11 (×2): 4 [IU] via SUBCUTANEOUS
  Administered 2019-01-11: 3 [IU] via SUBCUTANEOUS
  Administered 2019-01-11 (×2): 4 [IU] via SUBCUTANEOUS
  Administered 2019-01-11: 3 [IU] via SUBCUTANEOUS
  Administered 2019-01-12: 4 [IU] via SUBCUTANEOUS
  Administered 2019-01-12 (×2): 3 [IU] via SUBCUTANEOUS
  Administered 2019-01-13: 7 [IU] via SUBCUTANEOUS
  Administered 2019-01-13: 3 [IU] via SUBCUTANEOUS
  Administered 2019-01-14: 7 [IU] via SUBCUTANEOUS
  Administered 2019-01-14: 4 [IU] via SUBCUTANEOUS
  Administered 2019-01-14: 3 [IU] via SUBCUTANEOUS
  Administered 2019-01-14 (×2): 11 [IU] via SUBCUTANEOUS
  Administered 2019-01-14: 7 [IU] via SUBCUTANEOUS
  Administered 2019-01-14: 15 [IU] via SUBCUTANEOUS
  Administered 2019-01-15: 11 [IU] via SUBCUTANEOUS
  Administered 2019-01-15: 7 [IU] via SUBCUTANEOUS
  Administered 2019-01-15 (×2): 3 [IU] via SUBCUTANEOUS
  Administered 2019-01-15: 7 [IU] via SUBCUTANEOUS
  Administered 2019-01-16: 4 [IU] via SUBCUTANEOUS
  Administered 2019-01-17: 3 [IU] via SUBCUTANEOUS
  Administered 2019-01-17: 4 [IU] via SUBCUTANEOUS
  Administered 2019-01-18: 3 [IU] via SUBCUTANEOUS
  Administered 2019-01-18 (×2): 4 [IU] via SUBCUTANEOUS
  Administered 2019-01-18 – 2019-01-19 (×2): 3 [IU] via SUBCUTANEOUS
  Administered 2019-01-19 (×4): 4 [IU] via SUBCUTANEOUS
  Administered 2019-01-20 – 2019-01-21 (×7): 3 [IU] via SUBCUTANEOUS
  Administered 2019-01-21 (×2): 4 [IU] via SUBCUTANEOUS
  Administered 2019-01-21 – 2019-01-22 (×5): 3 [IU] via SUBCUTANEOUS
  Administered 2019-01-22 (×2): 4 [IU] via SUBCUTANEOUS
  Administered 2019-01-23 (×2): 3 [IU] via SUBCUTANEOUS
  Administered 2019-01-23: 4 [IU] via SUBCUTANEOUS
  Administered 2019-01-23 – 2019-01-24 (×3): 3 [IU] via SUBCUTANEOUS
  Administered 2019-01-24: 4 [IU] via SUBCUTANEOUS
  Administered 2019-01-24 – 2019-01-26 (×7): 3 [IU] via SUBCUTANEOUS
  Administered 2019-01-26 – 2019-01-28 (×10): 4 [IU] via SUBCUTANEOUS
  Administered 2019-01-28: 7 [IU] via SUBCUTANEOUS
  Administered 2019-01-28: 3 [IU] via SUBCUTANEOUS
  Administered 2019-01-29 (×3): 4 [IU] via SUBCUTANEOUS
  Administered 2019-01-29: 3 [IU] via SUBCUTANEOUS
  Administered 2019-01-29 – 2019-01-30 (×2): 4 [IU] via SUBCUTANEOUS
  Administered 2019-01-30: 3 [IU] via SUBCUTANEOUS
  Administered 2019-01-30: 7 [IU] via SUBCUTANEOUS
  Administered 2019-01-30: 4 [IU] via SUBCUTANEOUS
  Administered 2019-01-31: 3 [IU] via SUBCUTANEOUS
  Administered 2019-01-31: 7 [IU] via SUBCUTANEOUS

## 2018-12-04 MED ORDER — MIDAZOLAM HCL 2 MG/2ML IJ SOLN
2.0000 mg | INTRAMUSCULAR | Status: AC | PRN
  Administered 2018-12-04 (×3): 2 mg via INTRAVENOUS
  Filled 2018-12-04 (×4): qty 2

## 2018-12-04 MED ORDER — MIDAZOLAM HCL 2 MG/2ML IJ SOLN
2.0000 mg | INTRAMUSCULAR | Status: DC | PRN
  Administered 2018-12-04 – 2018-12-07 (×6): 2 mg via INTRAVENOUS
  Filled 2018-12-04 (×6): qty 2

## 2018-12-04 MED ORDER — INSULIN GLARGINE 100 UNIT/ML ~~LOC~~ SOLN: 30.0000 [IU] | Freq: Every day | SUBCUTANEOUS | Status: DC

## 2018-12-04 MED ORDER — INSULIN DETEMIR 100 UNIT/ML ~~LOC~~ SOLN
30.0000 [IU] | Freq: Two times a day (BID) | SUBCUTANEOUS | Status: DC
  Administered 2018-12-04: 30 [IU] via SUBCUTANEOUS
  Filled 2018-12-04 (×2): qty 0.3

## 2018-12-04 MED ORDER — FENTANYL CITRATE (PF) 100 MCG/2ML IJ SOLN
100.0000 ug | Freq: Once | INTRAMUSCULAR | Status: AC
  Administered 2018-12-04: 50 ug via INTRAVENOUS

## 2018-12-04 MED ORDER — MIDAZOLAM HCL 2 MG/2ML IJ SOLN
4.0000 mg | Freq: Once | INTRAMUSCULAR | Status: AC
  Administered 2018-12-04: 2 mg via INTRAVENOUS

## 2018-12-04 MED ORDER — FENTANYL CITRATE (PF) 100 MCG/2ML IJ SOLN
100.0000 ug | INTRAMUSCULAR | Status: DC | PRN
  Administered 2018-12-04 – 2018-12-06 (×3): 100 ug via INTRAVENOUS
  Filled 2018-12-04 (×2): qty 2

## 2018-12-04 MED ORDER — FENTANYL CITRATE (PF) 100 MCG/2ML IJ SOLN
100.0000 ug | INTRAMUSCULAR | Status: AC | PRN
  Administered 2018-12-04 (×3): 100 ug via INTRAVENOUS
  Filled 2018-12-04 (×4): qty 2

## 2018-12-04 NOTE — Progress Notes (Signed)
NAME:  Antonio RichardsDaniel Watson Aldana Jr., MRN:  [id number], DOB:  Jun 08, 1956, LOS: 6 ADMISSION DATE:  11/28/2018, CONSULTATION DATE:  1/3 REFERRING MD:  1/3, CHIEF COMPLAINT:  Dyspnea  Brief History   63 y/o admitted with influenza A causing respiratory failure and cardiogenic shock.   Past Medical History  CAD, DM2, HTN, HLD  Significant Hospital Events   1/6 self extubated 1/7 re-intubated 1/8 self extubated 1/9 acute respiratory failure, mucus plugging right lower lobe, re-intubated, bronchoscopy  Consults:  PCCM  Procedures:  ETT 1/3 >> 1/6, 1/7 > 1/8  Significant Diagnostic Tests:  UDS 1/4 >> negative  ECHO 1/4 >> LVEF 20-25%, akinesis of the anteroseptal, anterolateral & apical myocardium, LVEF 20-25%, grade 1 diastolic dysfunction, small pericardial effusion without evidence of hemodynamic compromise CT Chest / ABD / Pelvis 1/3 >> airway thickening, motion artifact, CAD  Micro Data:  RVP 1/3 >> positive for influenza A BCx2 1/3 >>  UC 1/4 >>   Antimicrobials:  Vanco 1/3 x1 Zosyn 1/30 x1  Tamiflu 1/3 >>   Interim history/subjective:  Worsening respiratory failure CXR worse  Objective   Blood pressure (!) 161/82, pulse 100, temperature 98.6 F (37 C), temperature source Axillary, resp. rate (!) 28, height 6' (1.829 m), weight 97.7 kg, SpO2 99 %.    Vent Mode: PRVC FiO2 (%):  [40 %] 40 % Set Rate:  [20 bmp] 20 bmp Vt Set:  [620 mL] 620 mL PEEP:  [5 cmH20] 5 cmH20   Intake/Output Summary (Last 24 hours) at 12/04/2018 [id number] Last data filed at 12/04/2018 0800 Gross per 24 hour  Intake 806.43 ml  Output 1151 ml  Net -344.57 ml   Filed Weights   12/01/18 0500 12/02/18 0500 12/03/18 0500  Weight: 100.5 kg 98 kg 97.7 kg    Examination:  General:  Short of breath, not able to speak in full sentences, accessory muscle use HENT: NCAT OP clear PULM: Diminished right lung B, increased effort CV: RRR, no mgr GI: BS+, soft, nontender MSK: normal bulk and tone Neuro:  awake, alert, no distress, MAEW   Resolved Hospital Problem list   Hyperkalemia/AKI Influenza A  Assessment & Plan:  Acute respiratory failure with hypoxemia: acutely worse > intubate now, initiate full mechanical vent support > bronchoscopy now for mucus plugging right lower lobe, send aspirate for culture > VAP prevention > CXR post extubation > hopeful to wean after bronchoscopy, perhaps extubation today?  DM2: elevated > increase detemir BID > continue SSI  Shock: ventilator and sedation related > levophed as needed for MAP > 65 > hopeful to wean off  Acute systolic heart failure/HTN > continue enalprilat q6 as long as off levophed > consider B-blocker 1/10  Acute toxic metabolic encephalopathy > wean off precedex > frequent orientation   Best practice:  Diet: tube feeding Pain/Anxiety/Delirium protocol (if indicated): order PAD protocol today, RASS goal -1 to -2 VAP protocol (if indicated): yes DVT prophylaxis: lovenox GI prophylaxis: pepcid Glucose control: as above Mobility: bed res Code Status: full Family Communication: updated family bedside Disposition: remain in ICU  Labs   CBC: Recent Labs  Lab 11/29/18 0042 11/29/18 0332 11/30/18 0244 12/01/18 0447 12/04/18 0742  WBC 10.7* 11.8* 14.1* 14.0* 8.3  HGB 17.1* 17.2* 17.3* 15.5 16.4  HCT 51.8 51.6 52.9* 48.3 51.9  MCV 90.6 88.5 88.3 92.5 92.5  PLT 231 236 167 188 201    Basic Metabolic Panel: Recent Labs  Lab 11/28/18 2013  11/29/18 2228 11/30/18 0244 12/01/18 0447  12/02/18 0530 12/03/18 0500  NA  --    < > 136 136 136 140 141  K  --    < > 4.6 5.0 4.5 5.2* 3.7  CL  --    < > 104 104 104 109 111  CO2  --    < > 20* 21* 19* 20* 24  GLUCOSE  --    < > 98 122* 98 196* 279*  BUN  --    < > 41* 42* 46* 44* 52*  CREATININE  --    < > 2.52* 2.31* 2.07* 1.30* 0.94  CALCIUM  --    < > 8.6* 8.7* 7.7* 8.1* 7.3*  MG 1.7  --  2.7*  --   --   --   --   PHOS  --   --  5.6*  --   --   --   --     < > = values in this interval not displayed.   GFR: Estimated Creatinine Clearance: 101.2 mL/min (by C-G formula based on SCr of 0.94 mg/dL). Recent Labs  Lab 11/28/18 1725 11/28/18 1759 11/28/18 2036  11/29/18 0332 11/30/18 0244 11/30/18 1901 12/01/18 0447 12/04/18 0742  PROCALCITON 0.11  --   --   --   --   --   --   --   --   WBC  --   --   --    < > 11.8* 14.1*  --  14.0* 8.3  LATICACIDVEN  --  1.53 1.51  --  2.8*  --  0.9  --   --    < > = values in this interval not displayed.    Liver Function Tests: Recent Labs  Lab 11/28/18 1725 12/02/18 0530 12/03/18 1508  AST 28 101* 42*  ALT 23 49* 39  ALKPHOS 84 68 73  BILITOT 0.7 0.9 0.6  PROT 7.2 6.1* 6.3*  ALBUMIN 3.7 2.3* 2.2*   Recent Labs  Lab 11/30/18 0244  LIPASE 24  AMYLASE 62   Recent Labs  Lab 11/28/18 1725 12/02/18 0530  AMMONIA 55* 29    ABG    Component Value Date/Time   PHART 7.332 (L) 12/02/2018 0513   PCO2ART 37.5 12/02/2018 0513   PO2ART 56.0 (L) 12/02/2018 0513   HCO3 19.9 (L) 12/02/2018 0513   TCO2 21 (L) 12/02/2018 0513   ACIDBASEDEF 6.0 (H) 12/02/2018 0513   O2SAT 87.0 12/02/2018 0513     Coagulation Profile: Recent Labs  Lab 11/28/18 1658  INR 0.97    Cardiac Enzymes: Recent Labs  Lab 11/29/18 0042  TROPONINI 0.23*    HbA1C: Hgb A1c MFr Bld  Date/Time Value Ref Range Status  03/25/2017 05:59 AM 12.3 (H) 4.8 - 5.6 % Final    Comment:    (NOTE)         Pre-diabetes: 5.7 - 6.4         Diabetes: >6.4         Glycemic control for adults with diabetes: <7.0   12/28/2015 08:50 PM 11.7 (H) 4.8 - 5.6 % Final    Comment:    (NOTE)         Pre-diabetes: 5.7 - 6.4         Diabetes: >6.4         Glycemic control for adults with diabetes: <7.0     CBG: Recent Labs  Lab 12/03/18 1944 12/03/18 2106 12/03/18 2354 12/04/18 0353 12/04/18 0826  GLUCAP 146* 149* 162* 269* 233*  Critical care time: 45 minutes    Heber Hollymead, MD [facility] Pager:  [phone] Cell: [phone] If no response, call [phone]

## 2018-12-04 NOTE — Progress Notes (Signed)
SLP Cancellation Note  Patient Details Name: Antonio [name]. MRN: [id number] DOB: 1956-06-22   Cancelled treatment:        Checked earlier this am> RN stated pt with unstable respiration and not appropriate for dysphagia tx. Pt now intubated. ST will discharge pt from services at this time.    Royce Macadamia 12/04/2018, 1:50 PM  Breck Coons [place].Ed Nurse, children's [phone] Office [phone]

## 2018-12-04 NOTE — Progress Notes (Signed)
[facility] PCCM  Afternoon rounds  I put him on pressure support 12/5 and he was breathing about 35 times per minute and had low tidal volumes.  Has been intermittently agitated throughout the day.  Will add fentanyl infusion to help avoid self extubation.  Hopeful for extubation in AM if improved  Heber [place], MD [place] [facility] Pager: [phone] Cell: [phone] If no response, call [phone]

## 2018-12-04 NOTE — Progress Notes (Signed)
[facility] MD made aware of current vital signs. MD to enter order.

## 2018-12-04 NOTE — Progress Notes (Signed)
Nutrition Follow-up  DOCUMENTATION CODES:   Not applicable  INTERVENTION:   Resume:  Vital 1.5 @ 50 ml/hr (1200 ml/day) via OG tube Continue 60 ml Prostat BID  Provides: 2200 kcal, 141 grams protein, and 916 ml free water.    NUTRITION DIAGNOSIS:   Inadequate oral intake related to inability to eat as evidenced by NPO status. Ongoing  GOAL:   Provide needs based on ASPEN/SCCM guidelines Progressing  MONITOR:   Vent status, Labs, Skin, Weight trends, I & O's  REASON FOR ASSESSMENT:   Consult, Ventilator Enteral/tube feeding initiation and management  ASSESSMENT:   63 y/o M who presented to [facility] 1[facility]3 with fever, cough, chest pain & SOB. Pt found to be Influenza A positive. Developed progressive respiratory distress requiring intubation in the ER.    Pt discussed during ICU rounds and with RN.  Per MD AKI improving with diuresis Cardiology following with worsening EF  Spoke with CCM, will re-start TF since pt is now re-intubated  1/6 self extubated 1/7 re-intubated 1/8 self extubated 1/9 acute respiratory failure, mucus plugging right lower lobe, re-intubated, bronchoscopy  Patient is currently intubated on ventilator support MV: 11.1 L/min Temp (24hrs), Avg:98.7 F (37.1 C), Min:98.1 F (36.7 C), Max:99.5 F (37.5 C)  Labs and medications reviewed.  SSI, MVI, folic acid, levemir 30 units BID, thiamine  Levo @ 7 mcg CBG: 224  Diet Order:   Diet Order            Diet NPO time specified  Diet effective now              EDUCATION NEEDS:   Not appropriate for education at this time  Skin:  Skin Assessment: Skin Integrity Issues: Skin Integrity Issues:: Stage II Stage II: sacrum  Last BM:  1/8  Height:   Ht Readings from Last 1 Encounters:  11/29/18 6' (1.829 m)    Weight:   Wt Readings from Last 1 Encounters:  12/03/18 97.7 kg    Ideal Body Weight:  76.1 kg(adjusted for L BKA)  BMI:  Body mass index is 29.21 kg/m. Adjusted BMI:  30.3  Estimated Nutritional Needs:   Kcal:  2252  Protein:  120-150 grams  Fluid:  > 2 L/day  Kendell Bane RD, LDN, CNSC [phone] Pager [phone] After Hours Pager

## 2018-12-04 NOTE — Progress Notes (Addendum)
eLink Physician-Brief Progress Note Patient Name: Antonio [name]. DOB: August 01, 1956 MRN: [id number]   Date of Service  12/04/2018  HPI/Events of Note  Pt with hx of insulin-dependent diabetes, and has been off insulin gtt since 10PM on 12/03/2018 but now with CBG of 269.  eICU Interventions  Increase sliding scale to resistant.     Intervention Category Intermediate Interventions: Hyperglycemia - evaluation and treatment  Larinda Buttery 12/04/2018, 4:56 AM    6:29 AM BP remains elevated despite labetalol. Start enalaprilat IV 1.25mg  IV q6hrs.  Change to PO once pt able to take PO.

## 2018-12-04 NOTE — Procedures (Signed)
Intubation Procedure Note Antonio [name] [id number] 09-08-1956  Procedure: Intubation Indications: Respiratory insufficiency  Procedure Details Consent: Unable to obtain consent because of emergent medical necessity. Time Out: Verified patient identification, verified procedure, site/side was marked, verified correct patient position, special equipment/implants available, medications/allergies/relevent history reviewed, required imaging and test results available.  Performed  Drugs Versed 87m, Fentanyl 567m IV, Rocuronium 9064mV, Etomidate 62m68m DL x 1 with MAC 4 blade Grade 2 view 8.0 ET tube passed through cords under direct visualization Placement confirmed with bilateral breath sounds, positive EtCO2 change and smoke in tube   Evaluation Hemodynamic Status: BP stable throughout; O2 sats: stable throughout Patient's Current Condition: stable Complications: No apparent complications Patient did tolerate procedure well. Chest X-ray ordered to verify placement.  CXR: pending.   DougSimonne [name]/2020

## 2018-12-04 NOTE — Progress Notes (Signed)
eLink Physician-Brief Progress Note Patient Name: Antonio [name]. DOB: 12-May-1956 MRN: [id number]   Date of Service  12/04/2018  HPI/Events of Note  Pt self-extubated in the morning.  Informed by RN of hypertension with BP 224/103.  Pt is on lisinopril and metoprolol at home.   Pt is awake and alert.  He is not in respiratory distress.  He is orally suctioning himself appropriately. HR in the 100s.  eICU Interventions  Start on labetalol IV.     Intervention Category Major Interventions: Hypertension - evaluation and management  Larinda Buttery 12/04/2018, 12:53 AM

## 2018-12-04 NOTE — Procedures (Signed)
PCCM Video Bronchoscopy Procedure Note  The patient was informed of the risks (including but not limited to bleeding, infection, respiratory failure, lung injury, tooth/oral injury) and benefits of the procedure and gave consent, see chart.  Indication: Acute respiratory failure with hypoxemia, mucus plugging right lower lobe  Post Procedure Diagnosis: same  Location: [facility] ICU  Condition pre procedure: Critically ill in ICU  Medications for procedure: see intubation note  Procedure description: The bronchoscope was introduced through the endotracheal tube and passed to the bilateral lungs to the level of the subsegmental bronchi throughout the tracheobronchial tree.  Airway exam revealed that the endotracheal tube was in good position.  There was thick grey mucus seen around the carina and extending into both mainstem bronchi.  The left mainstem and tracheobronchial tree on the left was patent without airway occlusion or lesion.  The right upper lobe was patent, but the bronchus intermedius and all lobar airways distal were occluded.  This was suctioned with the bronchoscope and the airways were cleared easily.    Procedures performed: therapeutic aspiration of mucus from the right lung  Specimens sent: respiratory culture  Condition post procedure: critically ill, on vent  EBL: none  Complications: none  Heber Deshler, MD [facility] Pager: [phone] Cell: [phone] If no response, call [phone]

## 2018-12-04 NOTE — Progress Notes (Signed)
Pt reintubated and back on sedation.  Pt is still agitated and fighting ventilator with max sedation.  MD aware and order to uses PRN pushes.  See MAR.  RN will continue to monitor.

## 2018-12-05 ENCOUNTER — Inpatient Hospital Stay (HOSPITAL_COMMUNITY): Payer: Medicaid Other

## 2018-12-05 LAB — CBC WITH DIFFERENTIAL/PLATELET
Abs Immature Granulocytes: 0.15 10*3/uL — ABNORMAL HIGH (ref 0.00–0.07)
Basophils Absolute: 0 10*3/uL (ref 0.0–0.1)
Basophils Relative: 0 %
Eosinophils Absolute: 0 10*3/uL (ref 0.0–0.5)
Eosinophils Relative: 0 %
HCT: 47.6 % (ref 39.0–52.0)
Hemoglobin: 14.1 g/dL (ref 13.0–17.0)
Immature Granulocytes: 2 %
Lymphocytes Relative: 18 %
Lymphs Abs: 1.6 10*3/uL (ref 0.7–4.0)
MCH: 28.6 pg (ref 26.0–34.0)
MCHC: 29.6 g/dL — ABNORMAL LOW (ref 30.0–36.0)
MCV: 96.6 fL (ref 80.0–100.0)
Monocytes Absolute: 0.8 10*3/uL (ref 0.1–1.0)
Monocytes Relative: 9 %
NEUTROS PCT: 71 %
Neutro Abs: 6.1 10*3/uL (ref 1.7–7.7)
PLATELETS: 270 10*3/uL (ref 150–400)
RBC: 4.93 MIL/uL (ref 4.22–5.81)
RDW: 13.5 % (ref 11.5–15.5)
WBC: 8.7 10*3/uL (ref 4.0–10.5)
nRBC: 0 % (ref 0.0–0.2)

## 2018-12-05 LAB — MAGNESIUM
Magnesium: 2.7 mg/dL — ABNORMAL HIGH (ref 1.7–2.4)
Magnesium: 2.3 mg/dL (ref 1.7–2.4)

## 2018-12-05 LAB — BASIC METABOLIC PANEL
Anion gap: 9 (ref 5–15)
BUN: 60 mg/dL — ABNORMAL HIGH (ref 6–20)
CHLORIDE: 116 mmol/L — AB (ref 98–111)
CO2: 26 mmol/L (ref 22–32)
Calcium: 7.9 mg/dL — ABNORMAL LOW (ref 8.9–10.3)
Creatinine, Ser: 1.28 mg/dL — ABNORMAL HIGH (ref 0.61–1.24)
GFR calc Af Amer: >60 mL/min (ref >60)
GFR calc non Af Amer: >60 mL/min (ref >60)
Glucose, Bld: 330 mg/dL — ABNORMAL HIGH (ref 70–99)
Potassium: 4.3 mmol/L (ref 3.5–5.1)
Sodium: 151 mmol/L — ABNORMAL HIGH (ref 135–145)

## 2018-12-05 LAB — GLUCOSE, RANDOM: Glucose, Bld: 537 mg/dL (ref 70–99)

## 2018-12-05 LAB — PHOSPHORUS
Phosphorus: 1.5 mg/dL — ABNORMAL LOW (ref 2.5–4.6)
Phosphorus: 2.3 mg/dL — ABNORMAL LOW (ref 2.5–4.6)

## 2018-12-05 LAB — GLUCOSE, CAPILLARY
GLUCOSE-CAPILLARY: 360 mg/dL — AB (ref 70–99)
Glucose-Capillary: 334 mg/dL — ABNORMAL HIGH (ref 70–99)
Glucose-Capillary: 288 mg/dL — ABNORMAL HIGH (ref 70–99)
Glucose-Capillary: 416 mg/dL — ABNORMAL HIGH (ref 70–99)
Glucose-Capillary: 442 mg/dL — ABNORMAL HIGH (ref 70–99)
Glucose-Capillary: 275 mg/dL — ABNORMAL HIGH (ref 70–99)
Glucose-Capillary: 222 mg/dL — ABNORMAL HIGH (ref 70–99)

## 2018-12-05 MED ORDER — INSULIN ASPART 100 UNIT/ML IV SOLN
10.0000 [IU] | Freq: Once | INTRAVENOUS | Status: AC
  Administered 2018-12-05: 10 [IU] via INTRAVENOUS

## 2018-12-05 MED ORDER — INSULIN DETEMIR 100 UNIT/ML ~~LOC~~ SOLN
40.0000 [IU] | Freq: Two times a day (BID) | SUBCUTANEOUS | Status: DC
  Administered 2018-12-05: 40 [IU] via SUBCUTANEOUS
  Filled 2018-12-05: qty 0.4

## 2018-12-05 MED ORDER — FOLIC ACID 1 MG PO TABS
1.0000 mg | ORAL_TABLET | Freq: Every day | ORAL | Status: DC
  Administered 2018-12-06 – 2019-02-20 (×77): 1 mg
  Filled 2018-12-05 (×77): qty 1

## 2018-12-05 MED ORDER — INSULIN DETEMIR 100 UNIT/ML ~~LOC~~ SOLN
60.0000 [IU] | Freq: Two times a day (BID) | SUBCUTANEOUS | Status: DC
  Administered 2018-12-05 – 2018-12-10 (×11): 60 [IU] via SUBCUTANEOUS
  Filled 2018-12-05 (×12): qty 0.6

## 2018-12-05 MED ORDER — NOREPINEPHRINE-SODIUM CHLORIDE 4-0.9 MG/250ML-% IV SOLN
0.0000 ug/min | INTRAVENOUS | Status: DC
  Administered 2018-12-05: 9 ug/min via INTRAVENOUS
  Administered 2018-12-06: 2 ug/min via INTRAVENOUS
  Administered 2018-12-07: 7 ug/min via INTRAVENOUS
  Administered 2018-12-08: 5 ug/min via INTRAVENOUS
  Administered 2018-12-10: 1 ug/min via INTRAVENOUS
  Filled 2018-12-05 (×5): qty 250

## 2018-12-05 MED ORDER — SODIUM CHLORIDE 0.9 % IV BOLUS
500.0000 mL | Freq: Once | INTRAVENOUS | Status: AC
  Administered 2018-12-05: 500 mL via INTRAVENOUS

## 2018-12-05 MED ORDER — IBUPROFEN 100 MG/5ML PO SUSP
400.0000 mg | Freq: Three times a day (TID) | ORAL | Status: DC | PRN
  Administered 2018-12-05 – 2018-12-20 (×7): 400 mg
  Filled 2018-12-05 (×9): qty 20

## 2018-12-05 MED ORDER — VITAMIN B-1 100 MG PO TABS
100.0000 mg | ORAL_TABLET | Freq: Every day | ORAL | Status: DC
  Administered 2018-12-06 – 2019-02-20 (×77): 100 mg
  Filled 2018-12-05 (×78): qty 1

## 2018-12-05 MED ORDER — FREE WATER
100.0000 mL | Freq: Three times a day (TID) | Status: DC
  Administered 2018-12-05 – 2018-12-06 (×4): 100 mL

## 2018-12-05 MED ORDER — VANCOMYCIN HCL 10 G IV SOLR
2000.0000 mg | Freq: Once | INTRAVENOUS | Status: AC
  Administered 2018-12-05: 2000 mg via INTRAVENOUS
  Filled 2018-12-05: qty 2000

## 2018-12-05 MED ORDER — VANCOMYCIN HCL 10 G IV SOLR
1250.0000 mg | Freq: Once | INTRAVENOUS | Status: AC
  Administered 2018-12-06: 1250 mg via INTRAVENOUS
  Filled 2018-12-05: qty 1250

## 2018-12-05 MED ORDER — SODIUM CHLORIDE 0.9 % IV SOLN
1.0000 g | Freq: Three times a day (TID) | INTRAVENOUS | Status: DC
  Administered 2018-12-05 – 2018-12-07 (×6): 1 g via INTRAVENOUS
  Filled 2018-12-05 (×8): qty 1

## 2018-12-05 NOTE — Progress Notes (Signed)
CRITICAL VALUE ALERT  Critical Value: 537 Glucose  Date & Time Notied:  1/10/200@ 1308  Provider Notified: Dr. Kendrick Fries  Orders Received/Actions taken: New Orders given

## 2018-12-05 NOTE — Progress Notes (Signed)
Pharmacy Antibiotic Note  Antonio [name]. is a 63 y.o. male admitted on 11/28/2018 with influenza infection causing respiratory compromise and ultimately intubation. He self-extubated and required re-intubation. He completed a course of tamiflu for influenza. He is still febrile and may have developed pneumonia. Will initiate broad spectrum antibiotics. He has a small AKI 0.8 > 1.2.  Vancomycin 1500 mg IV Q 12 hrs. Goal AUC 400-550. Expected AUC: 539 SCr used: 1.2  Plan: -Ceftazidime 1g/8h -Vancomycin 2 g IV x1 then 1250 mg IV q12h -Monitor renal fx, cultures, levels as needed   Height: 6' (182.9 cm) Weight: 214 lb 15.2 oz (97.5 kg) IBW/kg (Calculated) : 77.6  Temp (24hrs), Avg:101.1 F (38.4 C), Min:98.4 F (36.9 C), Max:103.1 F (39.5 C)  Recent Labs  Lab 11/28/18 1759 11/28/18 2036  11/29/18 0332  11/30/18 0244 11/30/18 1901 12/01/18 0447 12/02/18 0530 12/03/18 0500 12/04/18 0742 12/05/18 0503  WBC  --   --    < > 11.8*  --  14.1*  --  14.0*  --   --  8.3 8.7  CREATININE  --   --    < > 1.60*   < > 2.31*  --  2.07* 1.30* 0.94 0.84 1.28*  LATICACIDVEN 1.53 1.51  --  2.8*  --   --  0.9  --   --   --   --   --    < > = values in this interval not displayed.    Estimated Creatinine Clearance: 74.3 mL/min (A) (by C-G formula based on SCr of 1.28 mg/dL (H)).    Allergies  Allergen Reactions  . Iodinated Diagnostic Agents Hives, Rash and Other (See Comments)    Blisters Staph Blisters / staph  . Penicillins Other (See Comments)    UNSPECIFIED REACTION  Has patient had a PCN reaction causing immediate rash, facial/tongue/throat swelling, SOB or lightheadedness with hypotension: No Has patient had a PCN reaction causing severe rash involving mucus membranes or skin necrosis: No Has patient had a PCN reaction that required hospitalization: No Has patient had a PCN reaction occurring within the last 10 years: No If all of the above answers are "NO", then may proceed  with Cephalosporin use.    Antimicrobials this admission: 1/3 vancomycin x1; 1/10 > 1/3 zosyn x1 1/3 tamiflu > 1/8 1/10 ceftazidime >  Dose adjustments this admission: N/A  Microbiology results:  1/3 RVP: flu A 1/4 mrsa pcr: neg 1/4 urine cx: neg 1/3 blood cx: ngtd 1/7 resp cx: NF 1/9 resp cx: IP 1/10 blood cx:   Baldem[place] Friday 12/05/2018 10:08 AM

## 2018-12-05 NOTE — Progress Notes (Addendum)
NAME:  Antonio Celani., MRN:  [id number], DOB:  1956-06-01, LOS: 7 ADMISSION DATE:  11/28/2018, CONSULTATION DATE:  1/3 REFERRING MD:  1/3, CHIEF COMPLAINT:  Dyspnea  Brief History   63 y/o admitted with influenza A causing respiratory failure and cardiogenic shock.   Past Medical History  CAD, DM2, HTN, HLD  Significant Hospital Events   1/6 self extubated 1/7 re-intubated 1/8 self extubated 1/9 acute respiratory failure, mucus plugging right lower lobe, re-intubated, bronchoscopy 1/10 New fever T Max 103.1 Consults:  PCCM  Procedures:  ETT 1/3 >> 1/6, 1/7 > 1/8> 1/9 1/9 Bronch>> Mucus plugging RLL  Significant Diagnostic Tests:  UDS 1/4 >> negative  ECHO 1/4 >> LVEF 20-25%, akinesis of the anteroseptal, anterolateral & apical myocardium, LVEF 62-83%, grade 1 diastolic dysfunction, small pericardial effusion without evidence of hemodynamic compromise CT Chest / ABD / Pelvis 1/3 >> airway thickening, motion artifact, CAD  Micro Data:  RVP 1/3 >> positive for influenza A BCx2 1/3 >> No growth UC 1/4 >> No Growth Sputum Cx 1/7 >>Normal respiratory flora BAL>> Sputum Cx 1/9>> GS Abundant WBC( PMN), Moderate GP rods,Few GP cocci Blood Cx 1/10>>   Antimicrobials:  Vanco 1/3 x1 Zosyn 1/30 x1  Tamiflu 1/3 >>   Interim history/subjective:  Weaning on 40%, 5/10 TMax of 103.1 CXR 1/10>> Improved bibasilar pulmonary opacities Weaning well on 40% 5/10, but abdominal breathing and increased rate and poor volumes on  5/5.  Objective   Blood pressure 106/62, pulse 78, temperature (!) 103.1 F (39.5 C), temperature source Axillary, resp. rate 20, height 6' (1.829 m), weight 97.5 kg, SpO2 94 %.    Vent Mode: CPAP;PSV FiO2 (%):  [40 %-100 %] 40 % Set Rate:  [20 bmp] 20 bmp Vt Set:  [620 mL] 620 mL PEEP:  [5 cmH20-8 cmH20] 5 cmH20 Pressure Support:  [5 cmH20-10 cmH20] 10 cmH20 Plateau Pressure:  [19 cmH20-22 cmH20] 21 cmH20   Intake/Output Summary (Last 24 hours) at  12/05/2018 0857 Last data filed at 12/05/2018 0800 Gross per 24 hour  Intake 3406.29 ml  Output 1220 ml  Net 2186.29 ml   Filed Weights   12/02/18 0500 12/03/18 0500 12/05/18 0500  Weight: 98 kg 97.7 kg 97.5 kg    Examination:  General:  Intubated and sedated, weaning per vent, in NAD HENT: NCAT OP clear, thick neck PULM: Bilateral chest excursion, coarse throughout with few wheezes noted, diminished per bases CV: S1, S2, RRR, no mgr, NSR per tele GI: BS+, soft, non tender, ND, Obese MSK: normal bulk and tone, L BKA, Right great toe amputation Neuro: Sedated, arouses with stimulation, , per nursing follows commands   Resolved Hospital Problem list   Hyperkalemia/AKI Influenza A  Assessment & Plan:  Acute respiratory failure with hypoxemia Multiple re-intubations > bronchoscopy 1/9 for mucus plugging right lower lobe, send aspirate for culture > VAP prevention > CXR 1/11 and prn > Wean as able with goal of successful extubation > minimize sedation as able   DM2: remains elevated > increase detemir to 40 units BID > continue resistant SSI > CBG Q 4  Shock: ventilator and sedation related Remains on levophed at 9 mcg/min > continue levophed as needed for MAP > 65 > Wean as able  Acute systolic heart failure/HTN > continue enalprilat q6 as long as off levophed > consider B-blocker 1/11  Acute toxic metabolic encephalopathy > continue attempts to wean off precedex > frequent orientation > lights on during the day and off  at night  Fever of 103.1on 1/10 WBC 8.7 BAL>>  GS with Abundant WBC / moderate GP rods: few GP rods>> reincubated CXR with improving opacities post bronch on 1/9 Plan > Vanc Zosyn per pharmacy > Follow Micro data/ sensitivities > Trend fever curve and WBC > Re-culture as is clinically indicated > Tylenol/ Ibuprofen for temp> 101.5 > Check PCT  Renal Creatinine bump overnight 1/10 >> .84 to 1.28 Hypernatremia Plan Maintain renal  perfusion with MAP > 65 Trend BMET Replete electrolytes as needed Start free water    Best practice:  Diet: tube feeding Pain/Anxiety/Delirium protocol (if indicated): order PAD protocol today, RASS goal -1 to -2 VAP protocol (if indicated): yes DVT prophylaxis: lovenox GI prophylaxis: pepcid Glucose control: as above Mobility: bed res Code Status: full Family Communication: updated family bedside 1/10 by Dr. Lake Bells Disposition: remain in ICU  Labs   CBC: Recent Labs  Lab 11/29/18 0332 11/30/18 0244 12/01/18 0447 12/04/18 0742 12/05/18 0503  WBC 11.8* 14.1* 14.0* 8.3 8.7  NEUTROABS  --   --   --   --  6.1  HGB 17.2* 17.3* 15.5 16.4 14.1  HCT 51.6 52.9* 48.3 51.9 47.6  MCV 88.5 88.3 92.5 92.5 96.6  PLT 236 167 188 201 664    Basic Metabolic Panel: Recent Labs  Lab 11/29/18 2228  12/01/18 0447 12/02/18 0530 12/03/18 0500 12/04/18 0742 12/04/18 1035 12/04/18 1633 12/05/18 0503  NA 136   < > 136 140 141 147*  --   --  151*  K 4.6   < > 4.5 5.2* 3.7 4.6  --   --  4.3  CL 104   < > 104 109 111 115*  --   --  116*  CO2 20*   < > 19* 20* 24 22  --   --  26  GLUCOSE 98   < > 98 196* 279* 258*  --   --  330*  BUN 41*   < > 46* 44* 52* 41*  --   --  60*  CREATININE 2.52*   < > 2.07* 1.30* 0.94 0.84  --   --  1.28*  CALCIUM 8.6*   < > 7.7* 8.1* 7.3* 8.8*  --   --  7.9*  MG 2.7*  --   --   --   --  2.5* 2.6* 2.4 2.7*  PHOS 5.6*  --   --   --   --  3.2 3.0 1.3* 1.5*   < > = values in this interval not displayed.   GFR: Estimated Creatinine Clearance: 74.3 mL/min (A) (by C-G formula based on SCr of 1.28 mg/dL (H)). Recent Labs  Lab 11/28/18 1725 11/28/18 1759 11/28/18 2036  11/29/18 0332 11/30/18 0244 11/30/18 1901 12/01/18 0447 12/04/18 0742 12/05/18 0503  PROCALCITON 0.11  --   --   --   --   --   --   --   --   --   WBC  --   --   --    < > 11.8* 14.1*  --  14.0* 8.3 8.7  LATICACIDVEN  --  1.53 1.51  --  2.8*  --  0.9  --   --   --    < > = values in  this interval not displayed.    Liver Function Tests: Recent Labs  Lab 11/28/18 1725 12/02/18 0530 12/03/18 1508 12/04/18 0742  AST 28 101* 42*  --   ALT 23 49* 39  --  ALKPHOS 84 68 73  --   BILITOT 0.7 0.9 0.6  --   PROT 7.2 6.1* 6.3*  --   ALBUMIN 3.7 2.3* 2.2* 2.3*   Recent Labs  Lab 11/30/18 0244  LIPASE 24  AMYLASE 62   Recent Labs  Lab 11/28/18 1725 12/02/18 0530  AMMONIA 55* 29    ABG    Component Value Date/Time   PHART 7.453 (H) 12/04/2018 1108   PCO2ART 42.5 12/04/2018 1108   PO2ART 190.0 (H) 12/04/2018 1108   HCO3 29.7 (H) 12/04/2018 1108   TCO2 31 12/04/2018 1108   ACIDBASEDEF 6.0 (H) 12/02/2018 0513   O2SAT 100.0 12/04/2018 1108     Coagulation Profile: Recent Labs  Lab 11/28/18 1658  INR 0.97    Cardiac Enzymes: Recent Labs  Lab 11/29/18 0042  TROPONINI 0.23*    HbA1C: Hgb A1c MFr Bld  Date/Time Value Ref Range Status  03/25/2017 05:59 AM 12.3 (H) 4.8 - 5.6 % Final    Comment:    (NOTE)         Pre-diabetes: 5.7 - 6.4         Diabetes: >6.4         Glycemic control for adults with diabetes: <7.0   12/28/2015 08:50 PM 11.7 (H) 4.8 - 5.6 % Final    Comment:    (NOTE)         Pre-diabetes: 5.7 - 6.4         Diabetes: >6.4         Glycemic control for adults with diabetes: <7.0     CBG: Recent Labs  Lab 12/04/18 1622 12/04/18 2022 12/04/18 2338 12/05/18 0316 12/05/18 0845  GLUCAP 266* 376* 463* 334* 288*     Critical care time:    Magdalen Spatz, AGACNP-BC [place] Pager # [phone] If no response, call [phone] 12/05/2018 8:58 AM

## 2018-12-05 NOTE — Progress Notes (Signed)
eLink Physician-Brief Progress Note Patient Name: Antonio [name]. DOB: 12/24/1955 MRN: [id number]   Date of Service  12/05/2018  HPI/Events of Note  Pt has been hyperglycemic with CBG in the 300s to 400s. Pt was restarted on tube feeds.  Given 25 units of Humalog overnight.  Pt is on Levemir 30 units BID.  CBG again this morning is in the 200s.  Pt also noted to have fever Tmax 103.  eICU Interventions  Increase Levemir to 40 units BID.  Continue resistant sliding scale.  Send blood cultures. Obtain sputum culture. Tylenol and alternate with ibuprofen.      Intervention Category Major Interventions: Hyperglycemia - active titration of insulin therapy;Other:  Larinda Buttery 12/05/2018, 5:09 AM

## 2018-12-05 NOTE — Progress Notes (Signed)
Remains re-intubated, febrile - not in a position for cath at this time - will readdress next week.  Chrystie NoseKenneth C. Hilty, MD, [facility] - CahFACC, FACP  [place]  [facility]  Medical Director of the [facility] Diplomate of the American Board of Clinical Lipidology Attending Cardiologist  Direct Dial: [phone]  Fax: [phone]  Website:  [email or web address]

## 2018-12-05 NOTE — Progress Notes (Signed)
Attending:    Subjective: Tachypneic on pressure support Febrile overnight  Objective: Vitals:   12/05/18 0700 12/05/18 0800 12/05/18 0818 12/05/18 0900  BP: 115/81 106/62 106/62 122/65  Pulse: 81 78 78 86  Resp:   20   Temp:   (!) 102.3 F (39.1 C)   TempSrc:   Axillary   SpO2: 95% 94%  96%  Weight:      Height:       Vent Mode: CPAP;PSV FiO2 (%):  [40 %-100 %] 40 % Set Rate:  [20 bmp] 20 bmp Vt Set:  [620 mL] 620 mL PEEP:  [5 cmH20-8 cmH20] 5 cmH20 Pressure Support:  [5 cmH20-10 cmH20] 10 cmH20 Plateau Pressure:  [19 cmH20-22 cmH20] 21 cmH20  Intake/Output Summary (Last 24 hours) at 12/05/2018 0945 Last data filed at 12/05/2018 0800 Gross per 24 hour  Intake 3383.71 ml  Output 1220 ml  Net 2163.71 ml    General:  In bed on vent HENT: NCAT ETT in place PULM: Wheezing, paradoxical breathing noted CV: RRR, no mgr GI: BS+, soft, nontender MSK: BKA on Rnormal bulk and tone Neuro: sedated on vent  CXR: right lower lobe infiltrate, ETT in place, images personally reviewed  CBC    Component Value Date/Time   WBC 8.7 12/05/2018 0503   RBC 4.93 12/05/2018 0503   HGB 14.1 12/05/2018 0503   HCT 47.6 12/05/2018 0503   PLT 270 12/05/2018 0503   MCV 96.6 12/05/2018 0503   MCH 28.6 12/05/2018 0503   MCHC 29.6 (L) 12/05/2018 0503   RDW 13.5 12/05/2018 0503   LYMPHSABS 1.6 12/05/2018 0503   MONOABS 0.8 12/05/2018 0503   EOSABS 0.0 12/05/2018 0503   BASOSABS 0.0 12/05/2018 0503    BMET    Component Value Date/Time   NA 151 (H) 12/05/2018 0503   K 4.3 12/05/2018 0503   CL 116 (H) 12/05/2018 0503   CO2 26 12/05/2018 0503   GLUCOSE 330 (H) 12/05/2018 0503   BUN 60 (H) 12/05/2018 0503   CREATININE 1.28 (H) 12/05/2018 0503   CALCIUM 7.9 (L) 12/05/2018 0503   GFRNONAA >60 12/05/2018 0503   GFRAA >60 12/05/2018 0503      Impression/Plan:  Acute respiratory failure with hypoxemia: continue full vent support, not passing SBT, continue pulm toilette Pneumonia:  at risk for pseudomonas and MRSA so will add vanc/zosyn until BAL culture back Flu: completed tamiflu Cardiomyopathy: wean off levophed for MAP > 65, needs LHC Acute agitation/encephalopathy: titrate sedation per PAD protocol for RASS goal -1 to -2 DM2: monitor glucose, continue increased dose of detemir bid  I updated son bedside  My cc time 35 minutes  Roselie Awkward, MD [place] [facility] Pager: [phone] Cell: [phone] After 3pm or if no response, call [phone]

## 2018-12-06 ENCOUNTER — Inpatient Hospital Stay (HOSPITAL_COMMUNITY): Payer: Medicaid Other

## 2018-12-06 DIAGNOSIS — I208 Other forms of angina pectoris: Secondary | ICD-10-CM

## 2018-12-06 LAB — COMPREHENSIVE METABOLIC PANEL
ALT: 23 U/L (ref 0–44)
AST: 21 U/L (ref 15–41)
Albumin: 1.6 g/dL — ABNORMAL LOW (ref 3.5–5.0)
Alkaline Phosphatase: 62 U/L (ref 38–126)
Anion gap: 7 (ref 5–15)
BUN: 54 mg/dL — AB (ref 6–20)
CO2: 27 mmol/L (ref 22–32)
Calcium: 7.5 mg/dL — ABNORMAL LOW (ref 8.9–10.3)
Chloride: 122 mmol/L — ABNORMAL HIGH (ref 98–111)
Creatinine, Ser: 1.24 mg/dL (ref 0.61–1.24)
GFR calc Af Amer: >60 mL/min (ref >60)
GFR calc non Af Amer: >60 mL/min (ref >60)
Glucose, Bld: 229 mg/dL — ABNORMAL HIGH (ref 70–99)
POTASSIUM: 4.3 mmol/L (ref 3.5–5.1)
Sodium: 156 mmol/L — ABNORMAL HIGH (ref 135–145)
Total Bilirubin: 0.4 mg/dL (ref 0.3–1.2)
Total Protein: 5.8 g/dL — ABNORMAL LOW (ref 6.5–8.1)

## 2018-12-06 LAB — GLUCOSE, CAPILLARY
GLUCOSE-CAPILLARY: 208 mg/dL — AB (ref 70–99)
Glucose-Capillary: 194 mg/dL — ABNORMAL HIGH (ref 70–99)
Glucose-Capillary: 173 mg/dL — ABNORMAL HIGH (ref 70–99)
Glucose-Capillary: 259 mg/dL — ABNORMAL HIGH (ref 70–99)
Glucose-Capillary: 289 mg/dL — ABNORMAL HIGH (ref 70–99)
Glucose-Capillary: 225 mg/dL — ABNORMAL HIGH (ref 70–99)

## 2018-12-06 LAB — MAGNESIUM: Magnesium: 2.7 mg/dL — ABNORMAL HIGH (ref 1.7–2.4)

## 2018-12-06 LAB — PROCALCITONIN: Procalcitonin: 0.8 ng/mL

## 2018-12-06 LAB — CBC
HCT: 42 % (ref 39.0–52.0)
Hemoglobin: 12.2 g/dL — ABNORMAL LOW (ref 13.0–17.0)
MCH: 28.6 pg (ref 26.0–34.0)
MCHC: 29 g/dL — ABNORMAL LOW (ref 30.0–36.0)
MCV: 98.4 fL (ref 80.0–100.0)
Platelets: 234 10*3/uL (ref 150–400)
RBC: 4.27 MIL/uL (ref 4.22–5.81)
RDW: 13.9 % (ref 11.5–15.5)
WBC: 11.6 10*3/uL — ABNORMAL HIGH (ref 4.0–10.5)
nRBC: 0 % (ref 0.0–0.2)

## 2018-12-06 LAB — CULTURE, RESPIRATORY W GRAM STAIN

## 2018-12-06 MED ORDER — INSULIN ASPART 100 UNIT/ML IV SOLN: 5.0000 [IU] | INTRAVENOUS | Status: DC

## 2018-12-06 MED ORDER — INSULIN ASPART 100 UNIT/ML IV SOLN
5.0000 [IU] | INTRAVENOUS | Status: DC
  Administered 2018-12-06 – 2018-12-07 (×5): 5 [IU] via SUBCUTANEOUS

## 2018-12-06 MED ORDER — FREE WATER
250.0000 mL | Status: DC
  Administered 2018-12-06 – 2018-12-08 (×12): 250 mL

## 2018-12-06 MED ORDER — FUROSEMIDE 10 MG/ML IJ SOLN
40.0000 mg | Freq: Once | INTRAMUSCULAR | Status: AC
  Administered 2018-12-06: 40 mg via INTRAVENOUS
  Filled 2018-12-06: qty 4

## 2018-12-06 MED ORDER — VANCOMYCIN HCL 10 G IV SOLR
1250.0000 mg | Freq: Two times a day (BID) | INTRAVENOUS | Status: DC
  Administered 2018-12-06 – 2018-12-07 (×2): 1250 mg via INTRAVENOUS
  Filled 2018-12-06 (×3): qty 1250

## 2018-12-06 NOTE — Progress Notes (Signed)
NAME:  Antonio [name]., MRN:  [id number], DOB:  06/06/1956, LOS: 8 ADMISSION DATE:  11/28/2018, CONSULTATION DATE:  1/3 REFERRING MD:  1/3, CHIEF COMPLAINT:  Dyspnea  Brief History   63 y/o admitted with influenza A causing respiratory failure and cardiogenic shock.   Past Medical History  CAD, DM2, HTN, HLD  Significant Hospital Events   1/6 self extubated 1/7 re-intubated 1/8 self extubated 1/9 acute respiratory failure, mucus plugging right lower lobe, re-intubated, bronchoscopy 1/10 New fever T Max 103.1, antibiotics resumed with ceftaz and vancomycin 1/11: Hemodynamically seems to be improving.  Very hypernatremic.  X-ray improving.  Adding free water, one-time Lasix, follow-up chemistry a.m.  Hoping to initiate weaning efforts on 1/12 Consults:  PCCM  Procedures:  ETT 1/3 >> 1/6, 1/7 > 1/8> 1/9 1/9 Bronch>> Mucus plugging RLL  Significant Diagnostic Tests:  UDS 1/4 >> negative  ECHO 1/4 >> LVEF 20-25%, akinesis of the anteroseptal, anterolateral & apical myocardium, LVEF 27-74%, grade 1 diastolic dysfunction, small pericardial effusion without evidence of hemodynamic compromise CT Chest / ABD / Pelvis 1/3 >> airway thickening, motion artifact, CAD  Micro Data:  RVP 1/3 >> positive for influenza A BCx2 1/3 >> No growth UC 1/4 >> No Growth Sputum Cx 1/7 >>Normal respiratory flora BAL>> Sputum Cx 1/9>> GS Abundant WBC( PMN), Moderate GP rods,Few GP cocci Blood Cx 1/10>>  Antimicrobials:  Vanco 1/3 x1 Zosyn 1/30 x1  Tamiflu 1/3 >>  1/10 vancomycin>>> 1/10 ceftaz >>>  Interim history/subjective:  Chest x-ray improved.Hemodynamics improving.   Objective   Blood pressure (Abnormal) 93/55, pulse 74, temperature (Abnormal) 101 F (38.3 C), temperature source Axillary, resp. rate 20, height 6' (1.829 m), weight 98.5 kg, SpO2 97 %.    Vent Mode: PRVC FiO2 (%):  [40 %] 40 % Set Rate:  [20 bmp] 20 bmp Vt Set:  [620 mL] 620 mL PEEP:  [5 cmH20] 5 cmH20 Plateau  Pressure:  [18 cmH20-20 cmH20] 20 cmH20   Intake/Output Summary (Last 24 hours) at 12/06/2018 1116 Last data filed at 12/06/2018 0800 Gross per 24 hour  Intake 3204.42 ml  Output 1145 ml  Net 2059.42 ml   Filed Weights   12/03/18 0500 12/05/18 0500 12/06/18 0432  Weight: 97.7 kg 97.5 kg 98.5 kg    Examination:  General: 63 year old white male currently sedated on mechanical ventilation HEENT normocephalic atraumatic no jugular venous distention sclera nonicteric Pulmonary: Scattered rhonchi, expiratory wheeze, no accessory use.  Minimal support on ventilator. Cardiac: Regular rate and rhythm Abdomen: Soft nontender Extremities: Warm and dry, left BKA. Neuro: Heavily sedated   Resolved Hospital Problem list   Hyperkalemia/AKI Influenza A  Assessment & Plan:   Acute respiratory failure with hypoxemia in setting of influenza possibly c/b VAP Multiple re-intubations Portable chest x-ray personally reviewed: This demonstrates support lines and tubes are in satisfactory position, there is bibasilar airspace disease right greater than left however this has improved when comparing prior films. Sputum cultures continue to be pending Plan Continuing full ventilator support VAP bundle PAD protocol, RASS currently -2 Lasix x1 today Repeat a.m. chest x-ray Follow-up pending cultures Day #2 of vancomycin and ceftaz, will narrow this is soon as clinically this is appropriate  Shock.  Suspect this is a mix of cardiogenic and drug-related at this point.  He is on massive doses of fentanyl on top of Precedex Plan Continue to titrate norepinephrine for mean arterial pressure greater than 65 Continue telemetry monitoring. KVO IV fluids.  I do not think  he needs more volume  Acute systolic heart failure/HTN Plan Holding off on beta-blockade and antihypertensive/ACE inhibitor until off pressors Continue telemetry monitoring Cardiology to see again next week, will eventually need cardiac  catheterization  AKI-->Improved Plan Avoid hypotension Renal dose medications  Severe hypernatremia Plan Adding free water Serial chemistries  Acute toxic metabolic encephalopathy Plan Continuing supportive care No change in Precedex or fentanyl for today May need to consider adding fentanyl patch as it seems as though pain is a contributing factor from recent boil removal on sacrum/buttocks Correct hypernatremia  Diabetes with hyperglycemia Plan We will adjust basal dosing of insulin and continue sliding scale   Best practice:  Diet: tube feeding Pain/Anxiety/Delirium protocol (if indicated): order PAD protocol today, RASS goal -1 to -2 VAP protocol (if indicated): yes DVT prophylaxis: lovenox GI prophylaxis: pepcid Glucose control: as above Mobility: bed rest Code Status: full Family Communication: updated family bedside 1/10 by Dr. Lake Bells Disposition: remain in ICU He remains critically ill due to ventilatory needs, titration of mechanical ventilation, application of PEEP, assessment of volume status, and correction of metabolic derangements.  Critical care time: 33 min      Erick Colace ACNP-BC [place] Pager # [phone] OR # [phone] if no answer

## 2018-12-07 ENCOUNTER — Inpatient Hospital Stay (HOSPITAL_COMMUNITY): Payer: Medicaid Other

## 2018-12-07 LAB — GLUCOSE, CAPILLARY
GLUCOSE-CAPILLARY: 151 mg/dL — AB (ref 70–99)
Glucose-Capillary: 149 mg/dL — ABNORMAL HIGH (ref 70–99)
Glucose-Capillary: 218 mg/dL — ABNORMAL HIGH (ref 70–99)
Glucose-Capillary: 245 mg/dL — ABNORMAL HIGH (ref 70–99)
Glucose-Capillary: 246 mg/dL — ABNORMAL HIGH (ref 70–99)
Glucose-Capillary: 185 mg/dL — ABNORMAL HIGH (ref 70–99)

## 2018-12-07 LAB — BASIC METABOLIC PANEL
Anion gap: 6 (ref 5–15)
BUN: 51 mg/dL — ABNORMAL HIGH (ref 6–20)
CO2: 24 mmol/L (ref 22–32)
Calcium: 7.4 mg/dL — ABNORMAL LOW (ref 8.9–10.3)
Chloride: 123 mmol/L — ABNORMAL HIGH (ref 98–111)
Creatinine, Ser: 1.29 mg/dL — ABNORMAL HIGH (ref 0.61–1.24)
GFR calc non Af Amer: 60 mL/min — ABNORMAL LOW (ref >60)
GLUCOSE: 199 mg/dL — AB (ref 70–99)
Potassium: 3.8 mmol/L (ref 3.5–5.1)
Sodium: 153 mmol/L — ABNORMAL HIGH (ref 135–145)

## 2018-12-07 LAB — CBC
HCT: 39.1 % (ref 39.0–52.0)
Hemoglobin: 11.6 g/dL — ABNORMAL LOW (ref 13.0–17.0)
MCH: 29.7 pg (ref 26.0–34.0)
MCHC: 29.7 g/dL — ABNORMAL LOW (ref 30.0–36.0)
MCV: 100 fL (ref 80.0–100.0)
Platelets: 253 10*3/uL (ref 150–400)
RBC: 3.91 MIL/uL — ABNORMAL LOW (ref 4.22–5.81)
RDW: 14.1 % (ref 11.5–15.5)
WBC: 11.3 10*3/uL — ABNORMAL HIGH (ref 4.0–10.5)
nRBC: 0 % (ref 0.0–0.2)

## 2018-12-07 LAB — POCT I-STAT 3, ART BLOOD GAS (G3+)
Acid-base deficit: 3 mmol/L — ABNORMAL HIGH (ref 0.0–2.0)
Bicarbonate: 23.4 mmol/L (ref 20.0–28.0)
O2 SAT: 84 %
Patient temperature: 101
TCO2: 25 mmol/L (ref 22–32)
pCO2 arterial: 50.2 mmHg — ABNORMAL HIGH (ref 32.0–48.0)
pH, Arterial: 7.282 — ABNORMAL LOW (ref 7.350–7.450)
pO2, Arterial: 59 mmHg — ABNORMAL LOW (ref 83.0–108.0)

## 2018-12-07 MED ORDER — DEXMEDETOMIDINE HCL IN NACL 200 MCG/50ML IV SOLN
0.0000 ug/kg/h | INTRAVENOUS | Status: DC
  Administered 2018-12-07 (×2): 0.8 ug/kg/h via INTRAVENOUS
  Filled 2018-12-07: qty 50

## 2018-12-07 MED ORDER — INSULIN ASPART 100 UNIT/ML IV SOLN
10.0000 [IU] | INTRAVENOUS | Status: DC
  Administered 2018-12-07 – 2018-12-09 (×11): 10 [IU] via SUBCUTANEOUS

## 2018-12-07 MED ORDER — SODIUM CHLORIDE 0.9 % IV SOLN
500.0000 mg | Freq: Four times a day (QID) | INTRAVENOUS | Status: DC
  Administered 2018-12-07 – 2018-12-16 (×35): 500 mg via INTRAVENOUS
  Filled 2018-12-07 (×42): qty 10

## 2018-12-07 MED ORDER — QUETIAPINE FUMARATE 25 MG PO TABS
50.0000 mg | ORAL_TABLET | Freq: Two times a day (BID) | ORAL | Status: DC
  Administered 2018-12-07 – 2018-12-12 (×11): 50 mg
  Filled 2018-12-07 (×11): qty 2

## 2018-12-07 MED ORDER — FENTANYL 75 MCG/HR TD PT72
75.0000 ug | MEDICATED_PATCH | TRANSDERMAL | Status: DC
  Administered 2018-12-07: 75 ug via TRANSDERMAL
  Administered 2018-12-10 – 2018-12-22 (×5): 1 via TRANSDERMAL
  Administered 2018-12-25: 75 ug via TRANSDERMAL
  Administered 2018-12-28: 1 via TRANSDERMAL
  Administered 2018-12-31 – 2019-01-03 (×2): 75 ug via TRANSDERMAL
  Administered 2019-01-06: 75 via TRANSDERMAL
  Filled 2018-12-07 (×11): qty 1

## 2018-12-07 MED ORDER — DEXTROSE 5 % IV SOLN
INTRAVENOUS | Status: DC
  Administered 2018-12-07 – 2018-12-09 (×3): via INTRAVENOUS

## 2018-12-07 NOTE — Progress Notes (Signed)
eLink Physician-Brief Progress Note Patient Name: Antonio [name]. DOB: Dec 09, 1955 MRN: [id number]   Date of Service  12/07/2018  HPI/Events of Note  Agitation - Request to renew Precedex IV infusion order.   eICU Interventions  Will renew Precedex IV infusion order.      Intervention Category Major Interventions: Other:  Antonio [name] 12/07/2018, 10:55 PM

## 2018-12-07 NOTE — Progress Notes (Addendum)
NAME:  Antonio Eller., MRN:  [id number], DOB:  September 13, 1956, LOS: 9 ADMISSION DATE:  11/28/2018, CONSULTATION DATE:  1/3 REFERRING MD:  1/3, CHIEF COMPLAINT:  Dyspnea  Brief History   63 y/o admitted with influenza A causing respiratory failure and cardiogenic shock.   Past Medical History  CAD, DM2, HTN, HLD  Significant Hospital Events   1/6 self extubated 1/7 re-intubated 1/8 self extubated 1/9 acute respiratory failure, mucus plugging right lower lobe, re-intubated, bronchoscopy 1/10 New fever T Max 103.1, antibiotics resumed with ceftaz and vancomycin 1/11: Hemodynamically seems to be improving.  Very hypernatremic.  X-ray improving.  Adding free water, one-time Lasix, follow-up chemistry a.m.  Hoping to initiate weaning efforts on 1/12 Consults:  PCCM  Procedures:  ETT 1/3 >> 1/6, 1/7 > 1/8> 1/9 1/9 Bronch>> Mucus plugging RLL  Significant Diagnostic Tests:  UDS 1/4 >> negative  ECHO 1/4 >> LVEF 20-25%, akinesis of the anteroseptal, anterolateral & apical myocardium, LVEF 95-62%, grade 1 diastolic dysfunction, small pericardial effusion without evidence of hemodynamic compromise CT Chest / ABD / Pelvis 1/3 >> airway thickening, motion artifact, CAD  Micro Data:  RVP 1/3 >> positive for influenza A BCx2 1/3 >> No growth UC 1/4 >> No Growth Sputum Cx 1/7 >>Normal respiratory flora BAL>> corynebacterium  Blood Cx 1/10>>  Antimicrobials:  Vanco 1/3 x1 Zosyn 1/30 x1  Tamiflu 1/3 >>  1/10 vancomycin>>>1/12 1/10 ceftaz >>>1/12 1/12 emycin >>> Interim history/subjective:  Weaning for short-term today, got agitated with increased work of breathing Objective   Blood pressure 121/65, pulse 71, temperature (Abnormal) 100.5 F (38.1 C), temperature source Axillary, resp. rate 20, height 6' (1.829 m), weight 98.5 kg, SpO2 97 %.    Vent Mode: PRVC FiO2 (%):  [40 %] 40 % Set Rate:  [20 bmp] 20 bmp Vt Set:  [620 mL] 620 mL PEEP:  [5 cmH20] 5 cmH20 Plateau Pressure:   [19 cmH20-22 cmH20] 20 cmH20   Intake/Output Summary (Last 24 hours) at 12/07/2018 1031 Last data filed at 12/07/2018 0800 Gross per 24 hour  Intake 4883.25 ml  Output 1220 ml  Net 3663.25 ml   Filed Weights   12/03/18 0500 12/05/18 0500 12/06/18 0432  Weight: 97.7 kg 97.5 kg 98.5 kg    Examination: General: This pleasant 63 year old male he is agitated and restless in the bed he is in no acute distress currently HEENT normocephalic atraumatic no jugular venous distention mucous membranes moist Pulmonary: Coarse scattered rhonchi,  mild accessory use after limited time on pressure support ventilation of 10, did not tolerate spontaneous breathing trial this morning. Cardiac: Regular rate and rhythm no murmur rub or gallop Abdomen: Soft nontender no organomegaly Extremities: Left BKA.  Brisk capillary refill. Neuro: Awake, interactive, slightly agitated.  Following commands. GU: Urine output via condom catheter. Derm/skin: Right proximal medial buttocks unstageable walls ulcer, continuing wound care   Resolved Hospital Problem list   Hyperkalemia/AKI Influenza A  Assessment & Plan:   Acute respiratory failure with hypoxemia in setting of influenza possibly c/b VAP Multiple re-intubations Portable chest x-ray personally reviewed: This demonstrates support lines and tubes are in satisfactory position, there is bibasilar airspace disease right greater than left however this has improved when comparing prior films. Sputum cultures continue to be pending Plan Continuing full ventilator support with daily spontaneous breathing trial and pressure support as tolerated VAP bundle PAD protocol rascal -2 Checking central venous pressures, however urine outputs fairly brisk right now, appears to be auto diuresing Repeating a.m.  chest x-ray Respiratory culture growing Corynebacterium; changing to erythromycin, will treat for 7 more days  Shock.  Suspect this is a mix of cardiogenic and  drug-related at this point.  He is on massive doses of fentanyl on top of Precedex Plan Continue to wean norepinephrine for mean arterial pressure greater than 65  Keep KVO for now  Transduce CVP   Acute systolic heart failure/HTN Plan Holding beta-blockade, ACE inhibitor and antihypertensives given vasoactive drip requirement  Continue telemetry monitoring  Cardiology to decide on cardiac catheterization at some point in the future once stabilized   AKI--> initially improved, creatinine bumped some with diuresis -Remains 10 L positive Plan Avoid hypotension Check central venous pressure, if greater than 12 will continue diuresis Renal dose medications  Severe hypernatremia; improved Plan Adjusting free water A.m. chemistry  Acute toxic metabolic encephalopathy, acutely exacerbated by discomfort as well Plan Continuing supportive care  Continue Precedex Cut fentanyl drip in half, consider fentanyl patch Mobilize when able Add Seroquel  Diabetes with hyperglycemia Plan Continue current sliding scale and basal coverage Increased coverage due to D5 water administration for hypernatremia  Proximal medial buttock pressure ulcer Plan Continuing daily Santyl debridement dressing Pressure relief interventions  Best practice:  Diet: tube feeding Pain/Anxiety/Delirium protocol (if indicated): order PAD protocol today, RASS goal -1 to -2 VAP protocol (if indicated): yes DVT prophylaxis: lovenox GI prophylaxis: pepcid Glucose control: as above Mobility: bed rest Code Status: full Family Communication: updated family bedside 1/10 by Dr. [place] Bells Disposition: He remains critically ill due to his need for mechanical ventilation, he is actively weaning FiO2 and PEEP, as well as pressure support weaning.  He continues to have severe metabolic derangements particularly his hyponatremia and we are treating that with free water replacement.  In addition his sputum cultures have come  back were narrowing his antibiotics.  In the past his agitation and pain have been a major barrier to progress.  We will continue wound care.  Adding a fentanyl patch.  Also adding Seroquel.   Critical care time: 33 min      Erick Colace ACNP-BC [place] Pager # [phone] OR # [phone] if no answer

## 2018-12-08 ENCOUNTER — Inpatient Hospital Stay (HOSPITAL_COMMUNITY): Payer: Medicaid Other

## 2018-12-08 LAB — CBC
HCT: 35.3 % — ABNORMAL LOW (ref 39.0–52.0)
HEMOGLOBIN: 9.9 g/dL — AB (ref 13.0–17.0)
MCH: 28.4 pg (ref 26.0–34.0)
MCHC: 28 g/dL — ABNORMAL LOW (ref 30.0–36.0)
MCV: 101.4 fL — ABNORMAL HIGH (ref 80.0–100.0)
Platelets: 235 10*3/uL (ref 150–400)
RBC: 3.48 MIL/uL — ABNORMAL LOW (ref 4.22–5.81)
RDW: 14 % (ref 11.5–15.5)
WBC: 7.9 10*3/uL (ref 4.0–10.5)
nRBC: 0 % (ref 0.0–0.2)

## 2018-12-08 LAB — GLUCOSE, CAPILLARY
GLUCOSE-CAPILLARY: 132 mg/dL — AB (ref 70–99)
GLUCOSE-CAPILLARY: 142 mg/dL — AB (ref 70–99)
Glucose-Capillary: 166 mg/dL — ABNORMAL HIGH (ref 70–99)
Glucose-Capillary: 82 mg/dL (ref 70–99)
Glucose-Capillary: 101 mg/dL — ABNORMAL HIGH (ref 70–99)
Glucose-Capillary: 132 mg/dL — ABNORMAL HIGH (ref 70–99)

## 2018-12-08 LAB — COMPREHENSIVE METABOLIC PANEL
ALT: 25 U/L (ref 0–44)
AST: 39 U/L (ref 15–41)
Albumin: 1.4 g/dL — ABNORMAL LOW (ref 3.5–5.0)
Alkaline Phosphatase: 70 U/L (ref 38–126)
Anion gap: 8 (ref 5–15)
BILIRUBIN TOTAL: 0.5 mg/dL (ref 0.3–1.2)
BUN: 39 mg/dL — ABNORMAL HIGH (ref 6–20)
CO2: 24 mmol/L (ref 22–32)
Calcium: 7.5 mg/dL — ABNORMAL LOW (ref 8.9–10.3)
Chloride: 124 mmol/L — ABNORMAL HIGH (ref 98–111)
Creatinine, Ser: 1.1 mg/dL (ref 0.61–1.24)
GFR calc Af Amer: >60 mL/min (ref >60)
GFR calc non Af Amer: >60 mL/min (ref >60)
Glucose, Bld: 124 mg/dL — ABNORMAL HIGH (ref 70–99)
POTASSIUM: 3.9 mmol/L (ref 3.5–5.1)
Sodium: 156 mmol/L — ABNORMAL HIGH (ref 135–145)
TOTAL PROTEIN: 5.8 g/dL — AB (ref 6.5–8.1)

## 2018-12-08 MED ORDER — DEXMEDETOMIDINE HCL IN NACL 400 MCG/100ML IV SOLN
0.0000 ug/kg/h | INTRAVENOUS | Status: DC
  Administered 2018-12-08 (×3): 1 ug/kg/h via INTRAVENOUS
  Administered 2018-12-08: 0.5 ug/kg/h via INTRAVENOUS
  Administered 2018-12-09: 0.7 ug/kg/h via INTRAVENOUS
  Administered 2018-12-09: 1 ug/kg/h via INTRAVENOUS
  Administered 2018-12-09: 0.8 ug/kg/h via INTRAVENOUS
  Administered 2018-12-09: 1 ug/kg/h via INTRAVENOUS
  Administered 2018-12-10: 0.7 ug/kg/h via INTRAVENOUS
  Administered 2018-12-10: 0.6 ug/kg/h via INTRAVENOUS
  Administered 2018-12-10 (×2): 0.7 ug/kg/h via INTRAVENOUS
  Administered 2018-12-10: 0.8 ug/kg/h via INTRAVENOUS
  Administered 2018-12-11 (×3): 0.7 ug/kg/h via INTRAVENOUS
  Administered 2018-12-11: 0.8 ug/kg/h via INTRAVENOUS
  Administered 2018-12-12 (×2): 0.7 ug/kg/h via INTRAVENOUS
  Filled 2018-12-08: qty 200
  Filled 2018-12-08: qty 100
  Filled 2018-12-08: qty 200
  Filled 2018-12-08 (×14): qty 100

## 2018-12-08 MED ORDER — SODIUM CHLORIDE 0.9 % IV SOLN
0.0000 ug/kg/h | INTRAVENOUS | Status: DC
  Administered 2018-12-08: 0.8 ug/kg/h via INTRAVENOUS
  Filled 2018-12-08: qty 4

## 2018-12-08 MED ORDER — FREE WATER
300.0000 mL | Status: DC
  Administered 2018-12-08 – 2018-12-10 (×12): 300 mL

## 2018-12-08 MED ORDER — ALTEPLASE 2 MG IJ SOLR
2.0000 mg | Freq: Once | INTRAMUSCULAR | Status: AC
  Administered 2018-12-09: 2 mg
  Filled 2018-12-08: qty 2

## 2018-12-08 NOTE — Progress Notes (Signed)
NAME:  Antonio [name]., MRN:  [id number], DOB:  January 11, 1956, LOS: 78 ADMISSION DATE:  11/28/2018, CONSULTATION DATE:  1/3 REFERRING MD:  1/3, CHIEF COMPLAINT:  Dyspnea  Brief History   63 y/o admitted with influenza A causing respiratory failure and cardiogenic shock.   Past Medical History  CAD, DM2, HTN, HLD  Significant Hospital Events   1/6 self extubated 1/7 re-intubated 1/8 self extubated 1/9 acute respiratory failure, mucus plugging right lower lobe, re-intubated, bronchoscopy 1/10 New fever T Max 103.1, antibiotics resumed with ceftaz and vancomycin 1/11: Hemodynamically seems to be improving.  Very hypernatremic.  X-ray improving.  Adding free water, one-time Lasix, follow-up chemistry a.m.  Hoping to initiate weaning efforts on 1/12 Consults:  PCCM  Procedures:  ETT 1/3 >> 1/6, 1/7 > 1/8> 1/9 1/9 Bronch>> Mucus plugging RLL  Significant Diagnostic Tests:  UDS 1/4 >> negative  ECHO 1/4 >> LVEF 20-25%, akinesis of the anteroseptal, anterolateral & apical myocardium, LVEF 41-28%, grade 1 diastolic dysfunction, small pericardial effusion without evidence of hemodynamic compromise CT Chest / ABD / Pelvis 1/3 >> airway thickening, motion artifact, CAD  Micro Data:  RVP 1/3 >> positive for influenza A BCx2 1/3 >> No growth UC 1/4 >> No Growth Sputum Cx 1/7 >>Normal respiratory flora BAL>> corynebacterium  Blood Cx 1/10> NGTD  Antimicrobials:  Vanco 1/3 x1 Zosyn 1/30 x1  Tamiflu 1/3 >>>1/8 1/10 vancomycin>>>1/12 1/10 ceftaz >>>1/12 1/12 emycin >>> Interim history/subjective:  Precedex added overnight for agitation. Tolerating AM PSV/CPAP  Objective   Blood pressure (!) 94/50, pulse 67, temperature (!) 101.7 F (38.7 C), temperature source Axillary, resp. rate 20, height 6' (1.829 m), weight 98.5 kg, SpO2 100 %. CVP:  [7 mmHg-61 mmHg] 61 mmHg  Vent Mode: PSV;CPAP FiO2 (%):  [40 %] 40 % Set Rate:  [20 bmp] 20 bmp Vt Set:  [620 mL] 620 mL PEEP:  [5 cmH20]  5 cmH20 Pressure Support:  [10 cmH20] 10 cmH20 Plateau Pressure:  [19 cmH20-24 cmH20] 19 cmH20   Intake/Output Summary (Last 24 hours) at 12/08/2018 0831 Last data filed at 12/08/2018 0800 Gross per 24 hour  Intake 5593.93 ml  Output 1650 ml  Net 3943.93 ml   Filed Weights   12/03/18 0500 12/05/18 0500 12/06/18 0432  Weight: 97.7 kg 97.5 kg 98.5 kg    Examination: General: older adult male, intubated, reclined in bed, NAD HEENT [place]/AT. PERRL. Moist mucus membranes. Patent nares. ETT secure Pulmonary: Course rhonchi throughout. Tolerated PSV/CPAP at this time. Strong cough  Cardiac: RRR no r/g/m. 2+ radial pulses  Abdomen: soft, round, non-tender, non-distended  Extremities: Left BKA, R pedal digit amputation. No RLE edema  Neuro: Awake, alert. Interactive and following commands.  GU: Urine output via condom catheter. Skin: Dressing on R proximal buttocks unstageable ulcer clean/dry/intact.    Resolved Hospital Problem list   Influenza A AKI  Assessment & Plan:   Acute respiratory failure with hypoxemia requiring intubation Influenza s/p tamiflu  Multiple re-intubations Corynebacterium positive on resp culture Plan Tolerating PSV/CPAP this morning Will continue PSV/CPAP, serially evaluate appropriateness for extubation in setting of multiple re-intubations this hospitalization  VAP bundle  Albuterol, duonebs  AM CXR Continue erythromycin  BUE soft restraints   Shock -etiology possible mixed between cardiogenic and Rx related hypotension Plan Wean NE as able, goal MAPs > 65 CVP qshift  Careful consideration of IVF boluses if acute hypotension given CHF and net-positive I/O   Acute systolic heart failure -chronic HTN Plan -given acute shock  requiring vasoactive gtt, holding home beta blocker, ACEi and antiHTN  -Continue telemetry -Possible cardiac cath in future when stable, will be per cards    AKI, resolved -improved Cr -remains net positive  Plan Maintain  adequate renal perfusion with MAPs > 65 Renally dose medications CVP qshift to help guide diuresis efforts  Strict I/O   Hypernatremia Plan Increasing free water flushes to 341m, adjust as needed Follow BMP  Encephalopathy -metabolic component due to sedation reg  Plan Frequent reorientation Delirium precautions Goal RASS 0 to -1  Continue Precedex, quietapine, fentanyl patch  PRN fentanyl and PRN versed available  qtc monitoring   Diabetes with hyperglycemia Plan Continue SSI and basal coverage   Pressure ulcer of proximal medial buttock Plan Continue santyl qD, gerbardt's butt cream Continue pressure offloading interventions    Best practice:  Diet: enteral nutrition Pain/Anxiety/Delirium protocol (if indicated): Precedex, seroquel, fentanyl, PRN fent PRN versed VAP protocol (if indicated): Yes DVT prophylaxis: Lovenox GI prophylaxis: famotidine  Glucose control: SSI with basal rate  Mobility: bed rest Code Status: full Family Communication: Mother and 1 adult son at bedside Disposition: continue ICU care   Critical care time: 542min    GEliseo GumMSN, AGACNP-BC [place]1/13/2020, 8:31 AM

## 2018-12-08 NOTE — Progress Notes (Signed)
Progress Note  Patient Name: Antonio [name]. Date of Encounter: 12/08/2018  Primary Cardiologist: No primary care provider on file. [place]  Subjective   Pt intubated, awake. Follows commands.   Inpatient Medications    Scheduled Meds: . aspirin  81 mg Per Tube Daily  . atorvastatin  40 mg Oral q1800  . bethanechol  10 mg Per Tube TID  . chlorhexidine gluconate (MEDLINE KIT)  15 mL Mouth Rinse BID  . Chlorhexidine Gluconate Cloth  6 each Topical Daily  . collagenase   Topical Daily  . enoxaparin (LOVENOX) injection  40 mg Subcutaneous Daily  . famotidine  20 mg Per Tube BID  . feeding supplement (PRO-STAT SUGAR FREE 64)  60 mL Per Tube BID  . fentaNYL  75 mcg Transdermal Q72H  . folic acid  1 mg Per Tube Daily  . free water  250 mL Per Tube Q4H  . Gerhardt's butt cream   Topical BID  . insulin aspart  0-20 Units Subcutaneous Q4H  . insulin aspart  10 Units Subcutaneous Q4H  . insulin detemir  60 Units Subcutaneous BID  . ipratropium-albuterol  3 mL Nebulization TID  . mouth rinse  15 mL Mouth Rinse 10 times per day  . multivitamin with minerals  1 tablet Per Tube Daily  . QUEtiapine  50 mg Per Tube BID  . sodium chloride flush  10-40 mL Intracatheter Q12H  . thiamine  100 mg Per Tube Daily   Continuous Infusions: . dexmedetomidine (PRECEDEX) IV infusion 0.8 mcg/kg/hr (12/08/18 0800)  . dextrose 50 mL/hr at 12/08/18 0800  . erythromycin 100 mL/hr at 12/08/18 0800  . feeding supplement (VITAL 1.5 CAL) 1,000 mL (12/08/18 0532)  . fentaNYL infusion INTRAVENOUS 225 mcg/hr (12/08/18 0800)  . norepinephrine (LEVOPHED) Adult infusion 3 mcg/min (12/08/18 0800)   PRN Meds: acetaminophen (TYLENOL) oral liquid 160 mg/5 mL, albuterol, albuterol, docusate, fentaNYL (SUBLIMAZE) injection, ibuprofen, labetalol, midazolam, sodium chloride flush   Vital Signs    Vitals:   12/08/18 0600 12/08/18 0700 12/08/18 0758 12/08/18 0800  BP: (!) 91/57 (!) 105/93 (!) 91/49 (!) 94/50   Pulse: 73 73 68 67  Resp: (!) '22 20 20 20  ' Temp:      TempSrc:      SpO2: 98% 100% 100% 100%  Weight:      Height:        Intake/Output Summary (Last 24 hours) at 12/08/2018 0823 Last data filed at 12/08/2018 0800 Gross per 24 hour  Intake 5593.93 ml  Output 1650 ml  Net 3943.93 ml   Last 3 Weights 12/06/2018 12/05/2018 12/03/2018  Weight (lbs) 217 lb 2.5 oz 214 lb 15.2 oz 215 lb 6.2 oz  Weight (kg) 98.5 kg 97.5 kg 97.7 kg      Telemetry    sinus- Personally Reviewed  ECG    No am ekg - Personally Reviewed  Physical Exam   GEN: Intubated, awake  Neck: No JVD Cardiac: RRR, no murmurs, rubs, or gallops.  Respiratory: Clear to auscultation bilaterally. GI: Soft, nontender, non-distended  Ext: Left LE amputation Neuro:  awake, follows commands  Labs    Chemistry Recent Labs  Lab 12/03/18 1508 12/04/18 0742  12/06/18 0540 12/07/18 0533 12/08/18 0526  NA  --  147*   < > 156* 153* 156*  K  --  4.6   < > 4.3 3.8 3.9  CL  --  115*   < > 122* 123* 124*  CO2  --  22   < >  '27 24 24  ' GLUCOSE  --  258*   < > 229* 199* 124*  BUN  --  41*   < > 54* 51* 39*  CREATININE  --  0.84   < > 1.24 1.29* 1.10  CALCIUM  --  8.8*   < > 7.5* 7.4* 7.5*  PROT 6.3*  --   --  5.8*  --  5.8*  ALBUMIN 2.2* 2.3*  --  1.6*  --  1.4*  AST 42*  --   --  21  --  39  ALT 39  --   --  23  --  25  ALKPHOS 73  --   --  62  --  70  BILITOT 0.6  --   --  0.4  --  0.5  GFRNONAA  --  >60   < > >60 60* >60  GFRAA  --  >60   < > >60 >60 >60  ANIONGAP  --  10   < > '7 6 8   ' < > = values in this interval not displayed.     Hematology Recent Labs  Lab 12/06/18 0540 12/07/18 0533 12/08/18 0526  WBC 11.6* 11.3* 7.9  RBC 4.27 3.91* 3.48*  HGB 12.2* 11.6* 9.9*  HCT 42.0 39.1 35.3*  MCV 98.4 100.0 101.4*  MCH 28.6 29.7 28.4  MCHC 29.0* 29.7* 28.0*  RDW 13.9 14.1 14.0  PLT 234 253 235    Cardiac EnzymesNo results for input(s): TROPONINI in the last 168 hours. No results for input(s): TROPIPOC  in the last 168 hours.   BNPNo results for input(s): BNP, PROBNP in the last 168 hours.   DDimer No results for input(s): DDIMER in the last 168 hours.   Radiology    Dg Chest Port 1 View  Result Date: 12/08/2018 CLINICAL DATA:  Follow-up endotracheal tube placement EXAM: PORTABLE CHEST 1 VIEW COMPARISON:  12/07/2018 FINDINGS: Endotracheal tube, nasogastric catheter and right-sided PICC line are noted. PICC line remains in the midportion of the right atrium. The lungs are well aerated bilaterally. Small bilateral pleural effusions are noted left slightly greater than right. No focal infiltrate is seen. IMPRESSION: Tubes and lines as described above. Small effusions bilaterally. Electronically Signed   By: Inez Catalina M.D.   On: 12/08/2018 07:07   Dg Chest Port 1 View  Result Date: 12/07/2018 CLINICAL DATA:  Pneumonia EXAM: PORTABLE CHEST 1 VIEW COMPARISON:  Chest radiograph 12/06/2018 FINDINGS: ET tube mid trachea. Right upper extremity PICC line tip projects over the right atrium. Enteric tube courses inferior to the diaphragm. Monitoring leads overlie the patient. Stable cardiomegaly. Bilateral interstitial opacities. Small left pleural effusion with underlying opacities. Right basilar opacities. No pneumothorax. IMPRESSION: Support apparatus as above including right upper extremity PICC line with tip projecting over the right atrium. Mild interstitial edema. Small left effusion with underlying opacities. Persistent opacities right lung base. Electronically Signed   By: Lovey Newcomer M.D.   On: 12/07/2018 09:08    Cardiac Studies   Echo 11/29/18 - Left ventricle: The cavity size was normal. Wall thickness was normal. Systolic function was severely reduced. The estimated ejection fraction was in the range of 20% to 25%. There is akinesis of the anteroseptal, anterolateral, and apical myocardium. Doppler parameters are consistent with abnormal left ventricular relaxation (grade 1  diastolic dysfunction). - Pericardium, extracardiac: A small pericardial effusion was identified circumferential to the heart. The fluid exhibited a fibrinous appearance.There was no evidence of hemodynamic compromise.  Patient  Profile     63 y.o. male with history of CAD, reduced EF (40% on lexiscan 04/2018) who presented with respiratory failure due to influenza. LV systolic function lower on echo during admission. LVEF=20-25%. Mild troponin elevation. He has had ongoing hypotension felt to be due to cardiogenic shockDiscussion regarding right and left heart cath when pt improved from acute pulmonary issues.   Assessment & Plan    1. Acute respiratory failure in setting of influenza: Pt remains intubated. He is felt to have influenza possibly complicated by VAP. He is on erythromycin. PCCM managing.   2. Cardiomyopathy/acute on chronic systolic CHF/cardiogenic shock: He is positive 15 liters since admission. Worsened renal function with diuresis. Will need right and left heart cath when stable. I will follow with you this week. Would anticipate cardiac cath when pt extubated and off of pressors.   For questions or updates, please contact [place] Please consult [email or web address] for contact info under        Signed, Lauree Chandler, MD  12/08/2018, 8:23 AM

## 2018-12-08 NOTE — Progress Notes (Signed)
Unable to obtain CVP reading despite multiple attempts by various RNs. Dr Isaiah Serge made aware.

## 2018-12-08 NOTE — Progress Notes (Signed)
RT NOTE:  Switched pt from PS 10 CPAP 5 to previously ventilator settings. Pt became very agitated and had increased HR. Pt is stable at this time.

## 2018-12-09 ENCOUNTER — Inpatient Hospital Stay (HOSPITAL_COMMUNITY): Payer: Medicaid Other

## 2018-12-09 DIAGNOSIS — I5021 Acute systolic (congestive) heart failure: Secondary | ICD-10-CM

## 2018-12-09 LAB — GLUCOSE, CAPILLARY
GLUCOSE-CAPILLARY: 187 mg/dL — AB (ref 70–99)
Glucose-Capillary: 117 mg/dL — ABNORMAL HIGH (ref 70–99)
Glucose-Capillary: 156 mg/dL — ABNORMAL HIGH (ref 70–99)
Glucose-Capillary: 181 mg/dL — ABNORMAL HIGH (ref 70–99)
Glucose-Capillary: 192 mg/dL — ABNORMAL HIGH (ref 70–99)
Glucose-Capillary: 90 mg/dL (ref 70–99)

## 2018-12-09 LAB — CBC
HEMATOCRIT: 35.2 % — AB (ref 39.0–52.0)
Hemoglobin: 10.2 g/dL — ABNORMAL LOW (ref 13.0–17.0)
MCH: 28.4 pg (ref 26.0–34.0)
MCHC: 29 g/dL — ABNORMAL LOW (ref 30.0–36.0)
MCV: 98.1 fL (ref 80.0–100.0)
Platelets: 247 10*3/uL (ref 150–400)
RBC: 3.59 MIL/uL — ABNORMAL LOW (ref 4.22–5.81)
RDW: 14 % (ref 11.5–15.5)
WBC: 8.6 10*3/uL (ref 4.0–10.5)
nRBC: 0 % (ref 0.0–0.2)

## 2018-12-09 LAB — BASIC METABOLIC PANEL
Anion gap: 5 (ref 5–15)
BUN: 32 mg/dL — AB (ref 6–20)
CHLORIDE: 120 mmol/L — AB (ref 98–111)
CO2: 26 mmol/L (ref 22–32)
Calcium: 7.8 mg/dL — ABNORMAL LOW (ref 8.9–10.3)
Creatinine, Ser: 1.04 mg/dL (ref 0.61–1.24)
GFR calc Af Amer: >60 mL/min (ref >60)
GFR calc non Af Amer: >60 mL/min (ref >60)
Glucose, Bld: 189 mg/dL — ABNORMAL HIGH (ref 70–99)
Potassium: 4.1 mmol/L (ref 3.5–5.1)
Sodium: 151 mmol/L — ABNORMAL HIGH (ref 135–145)

## 2018-12-09 MED ORDER — BISACODYL 10 MG RE SUPP
10.0000 mg | Freq: Every day | RECTAL | Status: DC | PRN
  Administered 2018-12-10 – 2019-01-24 (×2): 10 mg via RECTAL
  Filled 2018-12-09 (×2): qty 1

## 2018-12-09 MED ORDER — FUROSEMIDE 10 MG/ML IJ SOLN
40.0000 mg | Freq: Two times a day (BID) | INTRAMUSCULAR | Status: DC
  Administered 2018-12-09 – 2018-12-12 (×6): 40 mg via INTRAVENOUS
  Filled 2018-12-09 (×6): qty 4

## 2018-12-09 MED ORDER — FUROSEMIDE 10 MG/ML IJ SOLN
20.0000 mg | Freq: Once | INTRAMUSCULAR | Status: AC
  Administered 2018-12-09: 20 mg via INTRAVENOUS
  Filled 2018-12-09: qty 2

## 2018-12-09 NOTE — Progress Notes (Signed)
Verbal order given yesterday during AM shift to renew  BL wrist restraints.  I neglected to enter this order.  The PM RN continue to chart and assess the pt who was restrained during her shift.  Order received today to continue restraints.

## 2018-12-09 NOTE — Progress Notes (Signed)
NAME:  Chawn Spraggins., MRN:  [id number], DOB:  1955-12-27, LOS: 87 ADMISSION DATE:  11/28/2018, CONSULTATION DATE:  1/3 REFERRING MD:  1/3, CHIEF COMPLAINT:  Dyspnea  Brief History   63 y/o admitted with influenza A causing respiratory failure and cardiogenic shock.   Past Medical History  CAD, DM2, HTN, HLD  Significant Hospital Events   1/6 self extubated 1/7 re-intubated 1/8 self extubated 1/9 acute respiratory failure, mucus plugging right lower lobe, re-intubated, bronchoscopy 1/10 New fever T Max 103.1, antibiotics resumed with ceftaz and vancomycin 1/11: Hemodynamically seems to be improving.  Very hypernatremic.  X-ray improving.  Adding free water, one-time Lasix, follow-up chemistry a.m.  Hoping to initiate weaning efforts on 1/12 Consults:  PCCM  Procedures:  ETT 1/3 >> 1/6, 1/7 > 1/8> 1/9 1/9 Bronch>> Mucus plugging RLL  Significant Diagnostic Tests:  UDS 1/4 >> negative  ECHO 1/4 >> LVEF 20-25%, akinesis of the anteroseptal, anterolateral & apical myocardium, LVEF 42-70%, grade 1 diastolic dysfunction, small pericardial effusion without evidence of hemodynamic compromise CT Chest / ABD / Pelvis 1/3 >> airway thickening, motion artifact, CAD CXR 1/14> bilateral opacities, associated small pleural effusion  Micro Data:  RVP 1/3 >> positive for influenza A BCx2 1/3 >> No growth UC 1/4 >> No Growth Sputum Cx 1/7 >>Normal respiratory flora BAL>> corynebacterium  Blood Cx 1/10> NGTD  Antimicrobials:  Vanco 1/3 x1 Zosyn 1/30 x1  Tamiflu 1/3 >>>1/8 1/10 vancomycin>>>1/12 1/10 ceftaz >>>1/12 1/12 emycin >>> Interim history/subjective:  Tolerated PSV/CPAP most of day yesterday, switched to [facility] at 1600  No acute events overnight Patient calm, sedated, intubated   Objective   Blood pressure 120/62, pulse 74, temperature 99.9 F (37.7 C), temperature source Axillary, resp. rate (!) 21, height 6' (1.829 m), weight 98.5 kg, SpO2 100 %. CVP:  [6 mmHg-18  mmHg] 11 mmHg  Vent Mode: PRVC FiO2 (%):  [40 %] 40 % Set Rate:  [20 bmp] 20 bmp Vt Set:  [620 mL] 620 mL PEEP:  [5 cmH20] 5 cmH20 Pressure Support:  [10 cmH20] 10 cmH20 Plateau Pressure:  [16 cmH20-22 cmH20] 19 cmH20   Intake/Output Summary (Last 24 hours) at 12/09/2018 0847 Last data filed at 12/09/2018 0800 Gross per 24 hour  Intake 4813.21 ml  Output 1755 ml  Net 3058.21 ml   Filed Weights   12/03/18 0500 12/05/18 0500 12/06/18 0432  Weight: 97.7 kg 97.5 kg 98.5 kg    Examination: General: older adult male, intubated, sedated, NAD HEENT [place], AT. PERRL. Pupils 3m. Anicteric sclera. ETT secure, trachea midline  Pulmonary: Rhonchi throughout, some RUL crackles.  Cardiac: RRR, no r/g/m. 2+ radial pulses bilaterally. Capillary refill < 3 sec BUE Abdomen: soft, round, non-distended, non-tender. Normoactive x4 Extremities: Left BKA, R sided toe amputation. No RLE edema.  Neuro: Drowsy. Awakens to voice. Follows simple commands.  GU: Yellow urine collecting via catheter Skin: Clean, damp. Dressing to buttock wound clean dry intact    Resolved Hospital Problem list   Influenza A AKI  Assessment & Plan:    Acute respiratory failure with hypoxemia requiring mechanical ventilation  Has had multiple re-intubations this hospital course Influenza s/p tamilflu Corynebacterium PNA CXR 1/14 bilateral opacities-- atelectasis vs pulm edema  Plan -Tolerated PSV/CPAP x 6 hours before requiring PRVC -Plan to diurese today, then PSV/CPAP as tolerated -Follow up BMP for lytes, Cr -Would like to optimize for hopeful extubation attempt; however need to talk with family RE plans if patient fails extubation (trach vs comfort  care) -Continue erythromycin for corynebacterium -Aggressive pulm toilet -Continue VAP bundle -Continue Albuterol, duonebs  -Follow AM CXR  Shock -etiology possible mixed between cardiogenic, sepsis and Rx related hypotension Plan -Wean NE for goal MAPs> 65, would  like to try to dc today  -CVP qshit -Trend WBC, temperature. Continuing erythromycin as outlined above  Acute systolic heart failure -HTN  Plan -holding home ACEi and beta blocker in setting of shock -judicious IVF -Continue tele  -Will diurese today as outlined above -Cards following, anticipating cardiac cath in future, per cardiology  Hypernatremia Down trending 1/13 to 1/14 Plan -Continue FWF 347m -Monitor Na on BMP  Encephalopathy  -likely secondary to CNS depressing medicatons  Plan -Delirium precautions -Goal RASS 0 -1 -Continue precedex gtt, seroquel, fent patch -PRN fent and PRN versed available  Diabetes with hyperglycemia Plan -Continue SSI and basal insulin   Pressure ulcer of proximal medial buttocks Plan -Continue pressure offloading interventions -Continue collagenase and gerhardts butt cream    Best practice:  Diet: Enteral Nutrition Pain/Anxiety/Delirium protocol (if indicated): Precedex gtt, seroquel, fentanyl patch. PRN fentanyl and PRN versed VAP protocol (if indicated): Yes DVT prophylaxis: Lovenox GI prophylaxis: Famotidine  Glucose control: SSI and basal rate Mobility: bed rest Code Status: Full Family Communication: No family at bedside today  Disposition: Continue ICU level of care   Critical care time: 45 min    GEliseo GumMSN, AGACNP-BC [place]1/14/2020, 9:13 AM

## 2018-12-09 NOTE — Progress Notes (Signed)
Dr. Silvestre Gunner discussed goals of care today with mother, son & pt.  Later in the day the pt's son [name]) said his dad would want to proceed with a tracheostomy and wanted to know how/when he needed to sign consent. I suggested they be present tomorrow during rounds to discuss details with MD.

## 2018-12-09 NOTE — Progress Notes (Addendum)
Progress Note  Patient Name: Antonio [name]. Date of Encounter: 12/09/2018  Primary Cardiologist: No primary care provider on file.   Subjective   Son and mother at bedside. Patient is awake and able to follow command.   Inpatient Medications    Scheduled Meds: . aspirin  81 mg Per Tube Daily  . atorvastatin  40 mg Oral q1800  . bethanechol  10 mg Per Tube TID  . chlorhexidine gluconate (MEDLINE KIT)  15 mL Mouth Rinse BID  . Chlorhexidine Gluconate Cloth  6 each Topical Daily  . collagenase   Topical Daily  . enoxaparin (LOVENOX) injection  40 mg Subcutaneous Daily  . famotidine  20 mg Per Tube BID  . feeding supplement (PRO-STAT SUGAR FREE 64)  60 mL Per Tube BID  . fentaNYL  75 mcg Transdermal Q72H  . folic acid  1 mg Per Tube Daily  . free water  300 mL Per Tube Q4H  . Gerhardt's butt cream   Topical BID  . insulin aspart  0-20 Units Subcutaneous Q4H  . insulin aspart  10 Units Subcutaneous Q4H  . insulin detemir  60 Units Subcutaneous BID  . ipratropium-albuterol  3 mL Nebulization TID  . mouth rinse  15 mL Mouth Rinse 10 times per day  . multivitamin with minerals  1 tablet Per Tube Daily  . QUEtiapine  50 mg Per Tube BID  . sodium chloride flush  10-40 mL Intracatheter Q12H  . thiamine  100 mg Per Tube Daily   Continuous Infusions: . dexmedetomidine 0.7 mcg/kg/hr (12/09/18 1046)  . dextrose 50 mL/hr at 12/09/18 1000  . erythromycin 500 mg (12/09/18 0629)  . feeding supplement (VITAL 1.5 CAL) 50 mL/hr at 12/09/18 1000  . fentaNYL infusion INTRAVENOUS 175 mcg/hr (12/09/18 1046)  . norepinephrine (LEVOPHED) Adult infusion Stopped (12/09/18 0946)   PRN Meds: acetaminophen (TYLENOL) oral liquid 160 mg/5 mL, albuterol, albuterol, docusate, fentaNYL (SUBLIMAZE) injection, ibuprofen, labetalol, midazolam, sodium chloride flush   Vital Signs    Vitals:   12/09/18 0740 12/09/18 0800 12/09/18 0900 12/09/18 1000  BP:  120/62 130/67 116/64  Pulse:  74 76 74    Resp:  (!) 21 20 (!) 23  Temp:  99.9 F (37.7 C)    TempSrc:  Axillary    SpO2: 99% 100% 98% 98%  Weight:      Height:        Intake/Output Summary (Last 24 hours) at 12/09/2018 1105 Last data filed at 12/09/2018 1000 Gross per 24 hour  Intake 4919.75 ml  Output 2505 ml  Net 2414.75 ml   Last 3 Weights 12/06/2018 12/05/2018 12/03/2018  Weight (lbs) 217 lb 2.5 oz 214 lb 15.2 oz 215 lb 6.2 oz  Weight (kg) 98.5 kg 97.5 kg 97.7 kg      Telemetry    Sinus rhythm with 1 episode of SVT - Personally Reviewed  ECG    Sinus tach with LBBB - Personally Reviewed  Physical Exam   GEN: intubated and sedated, alert  Neck: No JVD Cardiac: RRR, no murmurs, rubs, or gallops.  Respiratory: anterior exam clear to aucultation GI: Soft, nontender, non-distended  MS: L AKA Neuro:  Nonfocal  Psych: Normal affect   Labs    Chemistry Recent Labs  Lab 12/03/18 1508 12/04/18 0742  12/06/18 0540 12/07/18 0533 12/08/18 0526 12/09/18 0448  NA  --  147*   < > 156* 153* 156* 151*  K  --  4.6   < > 4.3  3.8 3.9 4.1  CL  --  115*   < > 122* 123* 124* 120*  CO2  --  22   < > '27 24 24 26  ' GLUCOSE  --  258*   < > 229* 199* 124* 189*  BUN  --  41*   < > 54* 51* 39* 32*  CREATININE  --  0.84   < > 1.24 1.29* 1.10 1.04  CALCIUM  --  8.8*   < > 7.5* 7.4* 7.5* 7.8*  PROT 6.3*  --   --  5.8*  --  5.8*  --   ALBUMIN 2.2* 2.3*  --  1.6*  --  1.4*  --   AST 42*  --   --  21  --  39  --   ALT 39  --   --  23  --  25  --   ALKPHOS 73  --   --  62  --  70  --   BILITOT 0.6  --   --  0.4  --  0.5  --   GFRNONAA  --  >60   < > >60 60* >60 >60  GFRAA  --  >60   < > >60 >60 >60 >60  ANIONGAP  --  10   < > '7 6 8 5   ' < > = values in this interval not displayed.     Hematology Recent Labs  Lab 12/07/18 0533 12/08/18 0526 12/09/18 0448  WBC 11.3* 7.9 8.6  RBC 3.91* 3.48* 3.59*  HGB 11.6* 9.9* 10.2*  HCT 39.1 35.3* 35.2*  MCV 100.0 101.4* 98.1  MCH 29.7 28.4 28.4  MCHC 29.7* 28.0* 29.0*  RDW 14.1  14.0 14.0  PLT 253 235 247    Cardiac EnzymesNo results for input(s): TROPONINI in the last 168 hours. No results for input(s): TROPIPOC in the last 168 hours.   BNPNo results for input(s): BNP, PROBNP in the last 168 hours.   DDimer No results for input(s): DDIMER in the last 168 hours.   Radiology    Dg Chest Port 1 View  Result Date: 12/09/2018 CLINICAL DATA:  Endotracheal tube placement. EXAM: PORTABLE CHEST 1 VIEW COMPARISON:  Radiograph December 08, 2018. FINDINGS: Stable cardiomegaly. Endotracheal nasogastric tubes are unchanged in position. Right-sided PICC line is unchanged in position. No pneumothorax is noted. Stable bibasilar opacities are noted concerning for edema or atelectasis with associated pleural effusions. Bony thorax is unremarkable. IMPRESSION: Stable bibasilar edema or atelectasis is noted with associated pleural effusions. Stable support apparatus. Electronically Signed   By: Marijo Conception, M.D.   On: 12/09/2018 07:28   Dg Chest Port 1 View  Result Date: 12/08/2018 CLINICAL DATA:  Follow-up endotracheal tube placement EXAM: PORTABLE CHEST 1 VIEW COMPARISON:  12/07/2018 FINDINGS: Endotracheal tube, nasogastric catheter and right-sided PICC line are noted. PICC line remains in the midportion of the right atrium. The lungs are well aerated bilaterally. Small bilateral pleural effusions are noted left slightly greater than right. No focal infiltrate is seen. IMPRESSION: Tubes and lines as described above. Small effusions bilaterally. Electronically Signed   By: Inez Catalina M.D.   On: 12/08/2018 07:07    Cardiac Studies   Echo 11/29/2018 LV EF: 20% -   25%  ------------------------------------------------------------------- Indications:      Chest pain 786.51.  ------------------------------------------------------------------- Study Conclusions  - Left ventricle: The cavity size was normal. Wall thickness was   normal. Systolic function was severely reduced.  The estimated  ejection fraction was in the range of 20% to 25%. There is   akinesis of the anteroseptal, anterolateral, and apical   myocardium. Doppler parameters are consistent with abnormal left   ventricular relaxation (grade 1 diastolic dysfunction). - Pericardium, extracardiac: A small pericardial effusion was   identified circumferential to the heart. The fluid exhibited a   fibrinous appearance.There was no evidence of hemodynamic   compromise.  Patient Profile     63 y.o. male with PMH of CAD, ICM with previous baseline of 40%, HTN, HLD and DM II who presented with acute respiratory failure due to influenza. EF was 20-25% on echo. He was intubated and treated for cardiogenic shock and respiratory failure. Pending left and right heart failure once recover.   Assessment & Plan    1. Acute respiratory failure in the setting of influeza  - currently extubated and sedated, but awake and following command, several attempts have been made to extubate the patient.  2. Acute on chronic systolic heart failure  - he is 18L positive since admission. Given 43m IV lasix today. Consider 239mdaily IV lasix, once euvolemic, may consider attempt at extubation again. Despite he is 18 L positive, I am not sure if it is accurate, he does not appears to be severely volume overloaded. CXR today showed small pleural effusion  3. Cardiogenic shock: off of pressor this morning.   For questions or updates, please contact [facility] consult [email or web address] for contact info under     Signed, HaAlmyra DeforestPASanta Fe1/14/2020, 11:05 AM    Patient seen, examined. Available data reviewed. Agree with findings, assessment, and plan as outlined by HaAlmyra DeforestPA.  The patient is independently interviewed and examined.  He is awake and alert, on the ventilator.  Lungs are coarse bilaterally with diminished breath sounds at the bases, heart is regular rate and rhythm without murmur gallop, abdomen is soft and  nontender, extremities show no significant edema.  Discussed outlook with family.  The patient has had multiple re-intubations.  CCM diuresing him as aggressively as possible in order to extubate successfully.  We will continue to follow and depending on his clinical course will consider further evaluation of his acute heart failure with cardiac catheterization once his respiratory status is stabilized.  MiSherren MochaM.D. 12/09/2018 12:57 PM

## 2018-12-09 NOTE — Progress Notes (Signed)
eLink Physician-Brief Progress Note Patient Name: Antonio [name]. DOB: [birth year] MRN: [id number]   Date of Service  12/09/2018  HPI/Events of Note  No bowel movement since 12/06/17, D5W infusing despite enteral nutrition  eICU Interventions  Dulcolax supp 1 rectally daily prn, d/c D5W infusion        Okoronkwo U Ogan 12/09/2018, 10:58 PM

## 2018-12-10 ENCOUNTER — Inpatient Hospital Stay (HOSPITAL_COMMUNITY): Payer: Medicaid Other

## 2018-12-10 DIAGNOSIS — J189 Pneumonia, unspecified organism: Secondary | ICD-10-CM

## 2018-12-10 LAB — GLUCOSE, CAPILLARY
Glucose-Capillary: 104 mg/dL — ABNORMAL HIGH (ref 70–99)
Glucose-Capillary: 105 mg/dL — ABNORMAL HIGH (ref 70–99)
Glucose-Capillary: 86 mg/dL (ref 70–99)
Glucose-Capillary: 99 mg/dL (ref 70–99)
Glucose-Capillary: 114 mg/dL — ABNORMAL HIGH (ref 70–99)
Glucose-Capillary: 148 mg/dL — ABNORMAL HIGH (ref 70–99)
Glucose-Capillary: 128 mg/dL — ABNORMAL HIGH (ref 70–99)
Glucose-Capillary: 98 mg/dL (ref 70–99)
Glucose-Capillary: 133 mg/dL — ABNORMAL HIGH (ref 70–99)

## 2018-12-10 LAB — CBC
HCT: 34.2 % — ABNORMAL LOW (ref 39.0–52.0)
Hemoglobin: 10.3 g/dL — ABNORMAL LOW (ref 13.0–17.0)
MCH: 28.4 pg (ref 26.0–34.0)
MCHC: 30.1 g/dL (ref 30.0–36.0)
MCV: 94.2 fL (ref 80.0–100.0)
Platelets: 250 10*3/uL (ref 150–400)
RBC: 3.63 MIL/uL — ABNORMAL LOW (ref 4.22–5.81)
RDW: 13.7 % (ref 11.5–15.5)
WBC: 9.1 10*3/uL (ref 4.0–10.5)
nRBC: 0 % (ref 0.0–0.2)

## 2018-12-10 LAB — BASIC METABOLIC PANEL
Anion gap: 10 (ref 5–15)
BUN: 33 mg/dL — AB (ref 6–20)
CO2: 27 mmol/L (ref 22–32)
Calcium: 8.1 mg/dL — ABNORMAL LOW (ref 8.9–10.3)
Chloride: 110 mmol/L (ref 98–111)
Creatinine, Ser: 0.96 mg/dL (ref 0.61–1.24)
GFR calc Af Amer: >60 mL/min (ref >60)
GFR calc non Af Amer: >60 mL/min (ref >60)
GLUCOSE: 122 mg/dL — AB (ref 70–99)
Potassium: 3.6 mmol/L (ref 3.5–5.1)
Sodium: 147 mmol/L — ABNORMAL HIGH (ref 135–145)

## 2018-12-10 LAB — APTT: aPTT: 37 seconds — ABNORMAL HIGH (ref 24–36)

## 2018-12-10 LAB — CULTURE, BLOOD (ROUTINE X 2)
Culture: NO GROWTH
Culture: NO GROWTH
SPECIAL REQUESTS: ADEQUATE
Special Requests: ADEQUATE

## 2018-12-10 LAB — PROTIME-INR
INR: 1.17
Prothrombin Time: 14.8 seconds (ref 11.4–15.2)

## 2018-12-10 MED ORDER — POTASSIUM CITRATE-CITRIC ACID 1100-334 MG/5ML PO SOLN
10.0000 meq | Freq: Two times a day (BID) | ORAL | Status: DC
  Filled 2018-12-10: qty 5

## 2018-12-10 MED ORDER — PROPOFOL 10 MG/ML IV BOLUS: 500.0000 mg | Freq: Once | INTRAVENOUS | Status: DC

## 2018-12-10 MED ORDER — FENTANYL CITRATE (PF) 100 MCG/2ML IJ SOLN: 200.0000 ug | Freq: Once | INTRAMUSCULAR | Status: DC

## 2018-12-10 MED ORDER — FREE WATER
250.0000 mL | Status: DC
  Administered 2018-12-10 – 2018-12-16 (×34): 250 mL

## 2018-12-10 MED ORDER — ETOMIDATE 2 MG/ML IV SOLN
40.0000 mg | Freq: Once | INTRAVENOUS | Status: DC
  Filled 2018-12-10: qty 20

## 2018-12-10 MED ORDER — MIDAZOLAM HCL 2 MG/2ML IJ SOLN: 5.0000 mg | Freq: Once | INTRAMUSCULAR | Status: DC

## 2018-12-10 MED ORDER — VECURONIUM BROMIDE 10 MG IV SOLR
10.0000 mg | Freq: Once | INTRAVENOUS | Status: DC
  Filled 2018-12-10: qty 10

## 2018-12-10 MED ORDER — POTASSIUM CHLORIDE 20 MEQ/15ML (10%) PO SOLN
10.0000 meq | Freq: Two times a day (BID) | ORAL | Status: AC
  Administered 2018-12-10 (×2): 10 meq via ORAL
  Filled 2018-12-10 (×2): qty 15

## 2018-12-10 MED ORDER — VITAL 1.5 CAL PO LIQD
1000.0000 mL | ORAL | Status: DC
  Administered 2018-12-10 – 2019-01-08 (×18): 1000 mL
  Filled 2018-12-10 (×48): qty 1000

## 2018-12-10 NOTE — Procedures (Signed)
Cortrak  Person Inserting Tube:  [name], RD Tube Type:  Cortrak - 43 inches Tube Location:  Right nare Initial Placement:  Postpyloric Secured by: Bridle Technique Used to Measure Tube Placement:  Documented cm marking at nare/ corner of mouth Cortrak Secured At:  80 cm   No x-ray is required. RN may begin using tube.   If the tube becomes dislodged please keep the tube and contact the Cortrak team at [email or web address] (password [email or web address]) for replacement.  If after hours and replacement cannot be delayed, place a NG tube and confirm placement with an abdominal x-ray.    [name] RD, LDN Clinical Nutrition Pager # - [phone]    

## 2018-12-10 NOTE — Progress Notes (Signed)
Nutrition Follow-up  DOCUMENTATION CODES:   Not applicable  INTERVENTION:   Increase Vital 1.5 to 55 ml/hr (1320 ml/day) via Cortrak tube Continue 60 ml Prostat BID  Provides: 2380 kcal, 149 grams protein, and 1003 ml free water.  Total free water: 2803 ml   NUTRITION DIAGNOSIS:   Inadequate oral intake related to inability to eat as evidenced by NPO status. Ongoing  GOAL:   Provide needs based on ASPEN/SCCM guidelines Progressing  MONITOR:   Vent status, Labs, Skin, Weight trends, I & O's  REASON FOR ASSESSMENT:   Consult Enteral/tube feeding initiation and management  ASSESSMENT:   63 y/o M who presented to [facility] 1/3 with fever, cough, chest pain & SOB. Pt found to be Influenza A positive. Developed progressive respiratory distress requiring intubation in the ER.     1/6 self extubated 1/7 re-intubated 1/8 self extubated 1/9 acute respiratory failure, mucus plugging right lower lobe, re-intubated, bronchoscopy 1/15 Cortrak placed (tip post-pyloric), trach tomorrow   Per RN abd is distended, dulcolax has been ordered. Last BM: 1/11.   Patient is currently intubated on ventilator support MV: 16.6 L/min Temp (24hrs), Avg:99.9 F (37.7 C), Min:98.4 F (36.9 C), Max:100.9 F (38.3 C)  Labs and medications reviewed.  SSI, MVI, folic acid, levemir 60 units BID, thiamine, lasix 40 mg every 12 hours, 10 mEq KCl BID  250 ml free water every 4 hours - 1800 ml  CBG: 114-148 Na 147 (H)  MAP: 83   I/O: +15,966 ml since admit UOP: 4500 ml x 24 hrs Pt is positive however weight is trending down.   Diet Order:   Diet Order            Diet NPO time specified  Diet effective midnight        Diet NPO time specified  Diet effective now              EDUCATION NEEDS:   Not appropriate for education at this time  Skin:  Skin Assessment: Skin Integrity Issues: Skin Integrity Issues:: Stage II Stage II: sacrum  Last BM:  1/8  Height:   Ht Readings from  Last 1 Encounters:  11/29/18 6' (1.829 m)    Weight:   Wt Readings from Last 1 Encounters:  12/06/18 98.5 kg    Ideal Body Weight:  76.1 kg(adjusted for L BKA)  Estimated Nutritional Needs:   Kcal:  2462  Protein:  120-150 grams  Fluid:  > 2 L/day  Kendell Bane RD, LDN, CNSC [phone] Pager [phone] After Hours Pager

## 2018-12-10 NOTE — Progress Notes (Signed)
eLink Physician-Brief Progress Note Patient Name: Antonio [name]. DOB: 23-Jul-1956 MRN: [id number]   Date of Service  12/10/2018  HPI/Events of Note  Needs restraints order renewed  eICU Interventions  Order renewed        Migdalia Dk 12/10/2018, 11:30 PM

## 2018-12-10 NOTE — Progress Notes (Signed)
Patient abdomen very distended and bowel sounds significantly diminished. CCM notified and PRN Dulcolax prescribed, will administer. Patient D5W gtt D/C, will monitor CBG Q1H for four hours per MD instructions.   Aris LotMegan Iasiah Ozment, RN

## 2018-12-10 NOTE — Progress Notes (Signed)
Progress Note  Patient Name: Antonio [name]. Date of Encounter: 12/10/2018  Primary Cardiologist: No primary care provider on file. [place]  Subjective   Pt intubated, sedated.   Inpatient Medications    Scheduled Meds: . aspirin  81 mg Per Tube Daily  . atorvastatin  40 mg Oral q1800  . chlorhexidine gluconate (MEDLINE KIT)  15 mL Mouth Rinse BID  . Chlorhexidine Gluconate Cloth  6 each Topical Daily  . collagenase   Topical Daily  . enoxaparin (LOVENOX) injection  40 mg Subcutaneous Daily  . famotidine  20 mg Per Tube BID  . feeding supplement (PRO-STAT SUGAR FREE 64)  60 mL Per Tube BID  . fentaNYL  75 mcg Transdermal Q72H  . folic acid  1 mg Per Tube Daily  . free water  250 mL Per Tube Q4H  . furosemide  40 mg Intravenous Q12H  . Gerhardt's butt cream   Topical BID  . insulin aspart  0-20 Units Subcutaneous Q4H  . insulin aspart  10 Units Subcutaneous Q4H  . insulin detemir  60 Units Subcutaneous BID  . ipratropium-albuterol  3 mL Nebulization TID  . mouth rinse  15 mL Mouth Rinse 10 times per day  . multivitamin with minerals  1 tablet Per Tube Daily  . potassium chloride  10 mEq Oral BID  . QUEtiapine  50 mg Per Tube BID  . sodium chloride flush  10-40 mL Intracatheter Q12H  . thiamine  100 mg Per Tube Daily   Continuous Infusions: . dexmedetomidine 0.8 mcg/kg/hr (12/10/18 0600)  . erythromycin 500 mg (12/10/18 0631)  . feeding supplement (VITAL 1.5 CAL) 1,000 mL (12/10/18 0624)  . fentaNYL infusion INTRAVENOUS 175 mcg/hr (12/10/18 0600)  . norepinephrine (LEVOPHED) Adult infusion 1 mcg/min (12/10/18 0600)   PRN Meds: acetaminophen (TYLENOL) oral liquid 160 mg/5 mL, albuterol, albuterol, bisacodyl, docusate, fentaNYL (SUBLIMAZE) injection, ibuprofen, labetalol, midazolam, sodium chloride flush   Vital Signs    Vitals:   12/10/18 0600 12/10/18 0744 12/10/18 0800 12/10/18 0825  BP: 110/65 112/60  (!) 145/74  Pulse: 63 68  (!) 103  Resp:  (!) 22   (!) 28  Temp:   (!) 100.9 F (38.3 C)   TempSrc:   Axillary   SpO2: 99% 97%  97%  Weight:      Height:        Intake/Output Summary (Last 24 hours) at 12/10/2018 0957 Last data filed at 12/10/2018 0600 Gross per 24 hour  Intake 2530.35 ml  Output 3550 ml  Net -1019.65 ml   Last 3 Weights 12/06/2018 12/05/2018 12/03/2018  Weight (lbs) 217 lb 2.5 oz 214 lb 15.2 oz 215 lb 6.2 oz  Weight (kg) 98.5 kg 97.5 kg 97.7 kg      Telemetry    Sinus rhythm with 1 episode of SVT - Personally Reviewed  ECG    Sinus tach with LBBB - Personally Reviewed  Physical Exam   GEN: intubated and sedated, alert  Neck: No JVD Cardiac: RRR, no murmurs, rubs, or gallops.  Respiratory: anterior exam clear to aucultation GI: Soft, nontender, non-distended  MS: L AKA Neuro:  Nonfocal  Psych: Normal affect   Labs    Chemistry Recent Labs  Lab 12/03/18 1508 12/04/18 0742  12/06/18 0540  12/08/18 0526 12/09/18 0448 12/10/18 0506  NA  --  147*   < > 156*   < > 156* 151* 147*  K  --  4.6   < > 4.3   < >  3.9 4.1 3.6  CL  --  115*   < > 122*   < > 124* 120* 110  CO2  --  22   < > 27   < > '24 26 27  ' GLUCOSE  --  258*   < > 229*   < > 124* 189* 122*  BUN  --  41*   < > 54*   < > 39* 32* 33*  CREATININE  --  0.84   < > 1.24   < > 1.10 1.04 0.96  CALCIUM  --  8.8*   < > 7.5*   < > 7.5* 7.8* 8.1*  PROT 6.3*  --   --  5.8*  --  5.8*  --   --   ALBUMIN 2.2* 2.3*  --  1.6*  --  1.4*  --   --   AST 42*  --   --  21  --  39  --   --   ALT 39  --   --  23  --  25  --   --   ALKPHOS 73  --   --  62  --  70  --   --   BILITOT 0.6  --   --  0.4  --  0.5  --   --   GFRNONAA  --  >60   < > >60   < > >60 >60 >60  GFRAA  --  >60   < > >60   < > >60 >60 >60  ANIONGAP  --  10   < > 7   < > '8 5 10   ' < > = values in this interval not displayed.     Hematology Recent Labs  Lab 12/08/18 0526 12/09/18 0448 12/10/18 0506  WBC 7.9 8.6 9.1  RBC 3.48* 3.59* 3.63*  HGB 9.9* 10.2* 10.3*  HCT 35.3* 35.2* 34.2*    MCV 101.4* 98.1 94.2  MCH 28.4 28.4 28.4  MCHC 28.0* 29.0* 30.1  RDW 14.0 14.0 13.7  PLT 235 247 250    Cardiac EnzymesNo results for input(s): TROPONINI in the last 168 hours. No results for input(s): TROPIPOC in the last 168 hours.   BNPNo results for input(s): BNP, PROBNP in the last 168 hours.   DDimer No results for input(s): DDIMER in the last 168 hours.   Radiology    Dg Chest Port 1 View  Result Date: 12/10/2018 CLINICAL DATA:  Respiratory failure requiring intubation EXAM: PORTABLE CHEST 1 VIEW COMPARISON:  Yesterday FINDINGS: Endotracheal tube tip at the clavicular heads. An orogastric tube reaches the stomach. Right upper extremity PICC with tip at the upper right atrium. Low volume chest with haziness and cardiomegaly. IMPRESSION: 1. Stable hardware. 2. Low volume chest with atelectasis and vascular congestion. Electronically Signed   By: Monte Fantasia M.D.   On: 12/10/2018 08:04   Dg Chest Port 1 View  Result Date: 12/09/2018 CLINICAL DATA:  Endotracheal tube placement. EXAM: PORTABLE CHEST 1 VIEW COMPARISON:  Radiograph December 08, 2018. FINDINGS: Stable cardiomegaly. Endotracheal nasogastric tubes are unchanged in position. Right-sided PICC line is unchanged in position. No pneumothorax is noted. Stable bibasilar opacities are noted concerning for edema or atelectasis with associated pleural effusions. Bony thorax is unremarkable. IMPRESSION: Stable bibasilar edema or atelectasis is noted with associated pleural effusions. Stable support apparatus. Electronically Signed   By: Marijo Conception, M.D.   On: 12/09/2018 07:28    Cardiac Studies   Echo  11/29/2018 LV EF: 20% -   25%  ------------------------------------------------------------------- Indications:      Chest pain 786.51.  ------------------------------------------------------------------- Study Conclusions  - Left ventricle: The cavity size was normal. Wall thickness was   normal. Systolic function was  severely reduced. The estimated   ejection fraction was in the range of 20% to 25%. There is   akinesis of the anteroseptal, anterolateral, and apical   myocardium. Doppler parameters are consistent with abnormal left   ventricular relaxation (grade 1 diastolic dysfunction). - Pericardium, extracardiac: A small pericardial effusion was   identified circumferential to the heart. The fluid exhibited a   fibrinous appearance.There was no evidence of hemodynamic   compromise.  Patient Profile     63 y.o. male with PMH of CAD, ICM with previous baseline of 40%, HTN, HLD and DM II who presented with acute respiratory failure due to influenza. EF was 20-25% on echo. He was intubated and treated for cardiogenic shock and respiratory failure. Pending left and right heart failure once recover.   Assessment & Plan    1. Acute respiratory failure in setting of influenza: He remains intubated today. Discussion regarding tracheostomy. He is felt to have influenza possibly complicated by VAP. He is on erythromycin. PCCM managing.   2. Cardiomyopathy/acute on chronic systolic CHF/cardiogenic shock: Positive 17 liters since admission. He is being diuresed by the CCM team. Renal function stable. We will follow along. Depending on his clinical course, will consider right and left heart catheterization.   I spoke to his mother and son at the bedside.   For questions or updates, please contact [place] Please consult [email or web address] for contact info under     Signed, Lauree Chandler, MD  12/10/2018, 9:57 AM

## 2018-12-10 NOTE — Progress Notes (Signed)
NAME:  Antonio [name]., MRN:  [id number], DOB:  Feb 25, 1956, LOS: 43 ADMISSION DATE:  11/28/2018, CONSULTATION DATE:  1/3 REFERRING MD:  1/3, CHIEF COMPLAINT:  Dyspnea  Brief History   63 y/o admitted with influenza A causing respiratory failure and cardiogenic shock.   Past Medical History  CAD, DM2, HTN, HLD  Significant Hospital Events   1/6 self extubated 1/7 re-intubated 1/8 self extubated 1/9 acute respiratory failure, mucus plugging right lower lobe, re-intubated, bronchoscopy 1/10 New fever T Max 103.1, antibiotics resumed with ceftaz and vancomycin 1/11: Hemodynamically seems to be improving.  Very hypernatremic.  X-ray improving.  Adding free water, one-time Lasix, follow-up chemistry a.m.  Hoping to initiate weaning efforts on 1/12 Consults:  PCCM  Procedures:  ETT 1/3 >> 1/6, 1/7 > 1/8> 1/9 1/9 Bronch>> Mucus plugging RLL  Significant Diagnostic Tests:  UDS 1/4 >> negative  ECHO 1/4 >> LVEF 20-25%, akinesis of the anteroseptal, anterolateral & apical myocardium, LVEF 02-33%, grade 1 diastolic dysfunction, small pericardial effusion without evidence of hemodynamic compromise CT Chest / ABD / Pelvis 1/3 >> airway thickening, motion artifact, CAD CXR 1/14> bilateral opacities, associated small pleural effusion  Micro Data:  RVP 1/3 >> positive for influenza A BCx2 1/3 >> No growth UC 1/4 >> No Growth Sputum Cx 1/7 >>Normal respiratory flora BAL>> corynebacterium  Blood Cx 1/10> NGTD  Antimicrobials:  Vanco 1/3 x1 Zosyn 1/30 x1  Tamiflu 1/3 >1/8 1/10 vancomycin>1/12 1/10 ceftaz >1/12 1/12 emycin >>> Interim history/subjective:  Tolerated PSV/CPAP x 4 hours yesterday before requiring switch to PRVC. Failed SBT this morning. Patient communicating by writing on paper   Objective   Blood pressure 112/60, pulse 68, temperature 98.4 F (36.9 C), temperature source Axillary, resp. rate (!) 22, height 6' (1.829 m), weight 98.5 kg, SpO2 97 %. CVP:  [3 mmHg-37  mmHg] 16 mmHg  Vent Mode: PSV;CPAP FiO2 (%):  [40 %] 40 % Set Rate:  [20 bmp] 20 bmp Vt Set:  [620 mL] 620 mL PEEP:  [5 cmH20] 5 cmH20 Pressure Support:  [10 cmH20-12 cmH20] 12 cmH20 Plateau Pressure:  [18 cmH20-22 cmH20] 21 cmH20   Intake/Output Summary (Last 24 hours) at 12/10/2018 0758 Last data filed at 12/10/2018 0600 Gross per 24 hour  Intake 2972.34 ml  Output 3550 ml  Net -577.66 ml   Filed Weights   12/03/18 0500 12/05/18 0500 12/06/18 0432  Weight: 97.7 kg 97.5 kg 98.5 kg    Examination: General: adult male, intubated NAD HEENT: [place]/AT. PERRL 52m Anicteric sclera. Mmm  Pulmonary: mechanically ventilated. Diminished bilaterally. Scattered crackles.  Cardiac: RRR no r/g/m. 1+ radial pulses. Capillary refill < 3 seconds BUE  Abdomen: soft, mild distention. Non-tender. Hypoactive x4  Extremities: L BKA. No RLE edema   Neuro: Awake, alert. Following commands.  Skin: clean, dry,  Warm. Buttock wound dressing intact    Resolved Hospital Problem list   Influenza A AKI  Assessment & Plan:    Acute respiratory failure with hypoxemia requiring mechanical ventilation -multiple failed extubations and reintubations this hospital course -Flu A pos, now s/p Tamiflu -corynebacterium PNA -Pulmonary edema vs. Atelectasis, pleural effusion. Now s/p Lasix  x1  -CXR 1/15 shows bilateral opacities, vascular congestion Plan -1/15 failed SBT -Considering repeat lasix today. BUN/Cr 33/0.96 s/p lasix yesterday  -Would like to optimize for extubation attempt, however before any attempt to be made need to have concrete plans in place should extubation fail. Family considering plausibility of trach, will revisit today -Continue erythromycin for  corynebacterium PNA -Aggressive pulm toilet  -Continue VAP bundle -Continue albuterol, duonebs -AM CXR   Hypotension requiring vasoactive medication -etiology likely CNS depressing medications Plan -Requiring low dose NE, did not tolerate  when weaned off yesterday. Wean/ stop today as tolerated for MAPs > 65 -CVP qshift -Continue to trend WBC, temperature   Acute systolic heart failure -with chronic HTN Plan -Holding home ACEi and beta blocker in setting of acute shock  -judicious -Continuous telemetry -Repeat diuresis today -Cardiology following and anticipating cardiac cath in future when stable  Hypernatremia Down trending 1/13> 1/14 > 1/15 Plan -Decrease FWF from 300 to 233m -Monitor Na on BMP  Encephalopathy -ICU delirium, CNS depressing medications Plan -continue delirium precautions -goal RASS 0 to -1 -Continue seroquel, dexmet gtt, fent patch  Diabetes with acute hyperglycemia -dextrose IVF dc overnight  Plan -continue SSI and basal insulin  Medial buttocks pressure ulcer Plan -Continue offloading pressure positional interventions -continue santyl, gerhardts -maintain clean dry intact dressing  Malnutrition, at risk P Enteral nutrition RDN consult   Electrolyte abnormalities, at risk -critically ill, actively diuresing with lasix P Monitor BMP Replace PRN   Best practice:  Diet: Enteral nutrition  Pain/Anxiety/Delirium protocol (if indicated): precedex, seroquel, fentany VAP protocol (if indicated): Yes DVT prophylaxis: lovenox GI prophylaxis: Famotidine Glucose control: SSI and basal rate Mobility: bed rest Code Status: Full Family Communication: Mother and adult son at bedside.  Disposition: Continue ICU level of care   Critical care time: 45 min    GEliseo GumMSN, AGACNP-BC [place]1/15/2020, 7:59 AM

## 2018-12-11 ENCOUNTER — Inpatient Hospital Stay (HOSPITAL_COMMUNITY): Payer: Medicaid Other

## 2018-12-11 DIAGNOSIS — Z930 Tracheostomy status: Secondary | ICD-10-CM

## 2018-12-11 LAB — GLUCOSE, CAPILLARY
GLUCOSE-CAPILLARY: 114 mg/dL — AB (ref 70–99)
GLUCOSE-CAPILLARY: 61 mg/dL — AB (ref 70–99)
Glucose-Capillary: 100 mg/dL — ABNORMAL HIGH (ref 70–99)
Glucose-Capillary: 170 mg/dL — ABNORMAL HIGH (ref 70–99)
Glucose-Capillary: 216 mg/dL — ABNORMAL HIGH (ref 70–99)
Glucose-Capillary: 253 mg/dL — ABNORMAL HIGH (ref 70–99)
Glucose-Capillary: 77 mg/dL (ref 70–99)

## 2018-12-11 LAB — BASIC METABOLIC PANEL
Anion gap: 9 (ref 5–15)
BUN: 31 mg/dL — ABNORMAL HIGH (ref 6–20)
CHLORIDE: 110 mmol/L (ref 98–111)
CO2: 28 mmol/L (ref 22–32)
Calcium: 8.5 mg/dL — ABNORMAL LOW (ref 8.9–10.3)
Creatinine, Ser: 0.98 mg/dL (ref 0.61–1.24)
GFR calc Af Amer: >60 mL/min (ref >60)
GFR calc non Af Amer: >60 mL/min (ref >60)
Glucose, Bld: 77 mg/dL (ref 70–99)
POTASSIUM: 3.7 mmol/L (ref 3.5–5.1)
Sodium: 147 mmol/L — ABNORMAL HIGH (ref 135–145)

## 2018-12-11 LAB — CBC
HCT: 33.4 % — ABNORMAL LOW (ref 39.0–52.0)
Hemoglobin: 10.3 g/dL — ABNORMAL LOW (ref 13.0–17.0)
MCH: 28.5 pg (ref 26.0–34.0)
MCHC: 30.8 g/dL (ref 30.0–36.0)
MCV: 92.3 fL (ref 80.0–100.0)
Platelets: 268 10*3/uL (ref 150–400)
RBC: 3.62 MIL/uL — ABNORMAL LOW (ref 4.22–5.81)
RDW: 13.4 % (ref 11.5–15.5)
WBC: 10.1 10*3/uL (ref 4.0–10.5)
nRBC: 0 % (ref 0.0–0.2)

## 2018-12-11 MED ORDER — ROCURONIUM BROMIDE 50 MG/5ML IV SOLN: 20.0000 mg | Freq: Once | INTRAVENOUS | Status: DC

## 2018-12-11 MED ORDER — FENTANYL BOLUS VIA INFUSION
100.0000 ug | Freq: Once | INTRAVENOUS | Status: AC
  Administered 2018-12-11: 100 ug via INTRAVENOUS
  Filled 2018-12-11: qty 100

## 2018-12-11 MED ORDER — MIDAZOLAM HCL 2 MG/2ML IJ SOLN
INTRAMUSCULAR | Status: AC
  Administered 2018-12-11: 2 mg via INTRAVENOUS
  Filled 2018-12-11: qty 2

## 2018-12-11 MED ORDER — ETOMIDATE 2 MG/ML IV SOLN
20.0000 mg | Freq: Once | INTRAVENOUS | Status: AC
  Administered 2018-12-11: 20 mg via INTRAVENOUS

## 2018-12-11 MED ORDER — ADULT MULTIVITAMIN LIQUID CH
15.0000 mL | Freq: Every day | ORAL | Status: DC
  Administered 2018-12-11 – 2019-01-05 (×26): 15 mL via ORAL
  Filled 2018-12-11 (×26): qty 15

## 2018-12-11 MED ORDER — SENNOSIDES 8.8 MG/5ML PO SYRP
15.0000 mL | ORAL_SOLUTION | Freq: Two times a day (BID) | ORAL | Status: DC
  Administered 2018-12-11 – 2018-12-17 (×6): 15 mL
  Filled 2018-12-11 (×7): qty 15

## 2018-12-11 MED ORDER — DEXTROSE 50 % IV SOLN
INTRAVENOUS | Status: AC
  Administered 2018-12-11: 25 mL
  Filled 2018-12-11: qty 50

## 2018-12-11 MED ORDER — INSULIN DETEMIR 100 UNIT/ML ~~LOC~~ SOLN
60.0000 [IU] | Freq: Two times a day (BID) | SUBCUTANEOUS | Status: DC
  Administered 2018-12-11 – 2018-12-15 (×7): 60 [IU] via SUBCUTANEOUS
  Filled 2018-12-11 (×10): qty 0.6

## 2018-12-11 MED ORDER — ROCURONIUM BROMIDE 50 MG/5ML IV SOLN
50.0000 mg | Freq: Once | INTRAVENOUS | Status: AC
  Administered 2018-12-11: 50 mg via INTRAVENOUS

## 2018-12-11 MED ORDER — DEXTROSE 5 % IV SOLN
INTRAVENOUS | Status: DC
  Administered 2018-12-11 – 2018-12-12 (×2): via INTRAVENOUS

## 2018-12-11 MED ORDER — ETOMIDATE 2 MG/ML IV SOLN
10.0000 mg | Freq: Once | INTRAVENOUS | Status: AC
  Administered 2018-12-11: 10 mg via INTRAVENOUS

## 2018-12-11 MED ORDER — MIDAZOLAM HCL 2 MG/2ML IJ SOLN
2.0000 mg | Freq: Once | INTRAMUSCULAR | Status: AC
  Administered 2018-12-11: 2 mg via INTRAVENOUS

## 2018-12-11 MED ORDER — POTASSIUM CHLORIDE 20 MEQ/15ML (10%) PO SOLN
20.0000 meq | Freq: Two times a day (BID) | ORAL | Status: AC
  Administered 2018-12-11 (×2): 20 meq via ORAL
  Filled 2018-12-11 (×2): qty 15

## 2018-12-11 MED ORDER — POLYETHYLENE GLYCOL 3350 17 G PO PACK
17.0000 g | PACK | Freq: Every day | ORAL | Status: DC
  Administered 2018-12-11 – 2018-12-16 (×4): 17 g
  Filled 2018-12-11 (×5): qty 1

## 2018-12-11 NOTE — Progress Notes (Signed)
NAME:  Antonio Nedved., MRN:  [id number], DOB:  1956-11-11, LOS: 78 ADMISSION DATE:  11/28/2018, CONSULTATION DATE:  1/3 REFERRING MD:  1/3, CHIEF COMPLAINT:  Dyspnea  Brief History   63 y/o admitted with influenza A causing respiratory failure and cardiogenic shock.   Past Medical History  CAD, DM2, HTN, HLD  Significant Hospital Events   1/6 self extubated 1/7 re-intubated 1/8 self extubated 1/9 acute respiratory failure, mucus plugging right lower lobe, re-intubated, bronchoscopy 1/10 New fever T Max 103.1, antibiotics resumed with ceftaz and vancomycin 1/11: Hemodynamically seems to be improving.  Very hypernatremic.  X-ray improving.  Adding free water, one-time Lasix, follow-up chemistry a.m.  Hoping to initiate weaning efforts on 1/12 1/16.  Continuing diuresis and ongoing ventilatory support.  Working on weaning but still has significant work of breathing.  Plan has been to proceed with tracheostomy.  Hopefully we can continue ongoing diuresis and continue to decrease sedation Consults:  PCCM  Procedures:  ETT 1/3 >> 1/6, 1/7 > 1/8> 1/9 1/9 Bronch>> Mucus plugging RLL  Significant Diagnostic Tests:  UDS 1/4 >> negative  ECHO 1/4 >> LVEF 20-25%, akinesis of the anteroseptal, anterolateral & apical myocardium, LVEF 83-15%, grade 1 diastolic dysfunction, small pericardial effusion without evidence of hemodynamic compromise CT Chest / ABD / Pelvis 1/3 >> airway thickening, motion artifact, CAD CXR 1/14> bilateral opacities, associated small pleural effusion  Micro Data:  RVP 1/3 >> positive for influenza A BCx2 1/3 >> No growth UC 1/4 >> No Growth Sputum Cx 1/7 >>Normal respiratory flora BAL>> corynebacterium  Blood Cx 1/10> NGTD  Antimicrobials:  Vanco 1/3 x1 Zosyn 1/30 x1  Tamiflu 1/3 >1/8 1/10 vancomycin>1/12 1/10 ceftaz >1/12 1/12 emycin >>> Interim history/subjective:  No change overnight    Objective   Blood pressure 111/65, pulse 69, temperature  (Abnormal) 97.4 F (36.3 C), temperature source Axillary, resp. rate 17, height 6' (1.829 m), weight 105.8 kg, SpO2 97 %. CVP:  [9 mmHg-18 mmHg] 9 mmHg  Vent Mode: PRVC FiO2 (%):  [40 %] 40 % Set Rate:  [14 bmp] 14 bmp Vt Set:  [620 mL] 620 mL PEEP:  [5 cmH20] 5 cmH20 Plateau Pressure:  [19 cmH20-21 cmH20] 19 cmH20   Intake/Output Summary (Last 24 hours) at 12/11/2018 0946 Last data filed at 12/11/2018 0835 Gross per 24 hour  Intake 2838.53 ml  Output 3350 ml  Net -511.47 ml   Filed Weights   12/05/18 0500 12/06/18 0432 12/11/18 0403  Weight: 97.5 kg 98.5 kg 105.8 kg    Examination: General: adult male, intubated NAD HEENT: [place]/AT. PERRL 55m Anicteric sclera. Mmm  Pulmonary: mechanically ventilated. Diminished bilaterally. Scattered crackles.  Cardiac: RRR no r/g/m. 1+ radial pulses. Capillary refill < 3 seconds BUE  Abdomen: soft, mild distention. Non-tender. Hypoactive x4  Extremities: L BKA. No RLE edema   Neuro: Awake, alert. Following commands.  Skin: clean, dry,  Warm. Buttock wound dressing intact    Resolved Hospital Problem list   Influenza A AKI  Assessment & Plan:    Acute respiratory failure with hypoxemia requiring mechanical ventilation secondary to influenza A, further complicated by -corynebacterium PNA, and pulmonary edema/volume overload Portable chest x-ray personally reviewed, demonstrates stable bilateral airspace disease, right greater than left.  Aeration a little bit worse on the right today. -Failed extubation x2 Plan Continuing full ventilator support with daily spontaneous breathing trial Proceeding with tracheostomy later today Aggressive diuresis, he is still over 16 L positive Continuing erythromycin, currently day number 5  of 10 VAP bundle Continue bronchodilators A.m. chest x-ray Need to get him out of bed after tracheostomy   Hypotension requiring vasoactive medication -etiology likely CNS depressing  medications Plan Norepinephrine for mean arterial pressure greater than 65  Acute systolic heart failure -with chronic HTN Plan We will continue to hold ACE inhibitor and beta-blockade, he was just discontinued from pressors in the last 24 hours Continuing Lasix Continuing telemetry Cardiology will need to be called back once more stable, will need to consider cardiac catheterization in the future   Electrolyte abnormalities: Hypernatremia P Free water replacement, I added back to D5 water  empirical potassium replacement with diuresis Strict intake output A.m. chemistry  Acute metabolic encephalopathy -ICU delirium, CNS depressing medications, this has improved significantly Plan Continue frequent reorientation RASS goal 0 to -1 Continue Seroquel and Precedex Fentanyl patch in place We will discontinue fentanyl drip after tracheostomy completed today   Diabetes with acute hyperglycemia, had hypoglycemia this a.m. associated with holding tube feeds  Plan Continuing sliding scale insulin Holding basal insulin this a.m., resume this evening after resuming tube feeds  Medial buttocks pressure ulcer Plan Continuing air mattress Continuing Santyl and Gerhardt's wound care Need to mobilize Maximize nutrition  Malnutrition, at risk P Resume tube feeding following trach tube  Constipation P Adding Senokot and MiraLAX  Best practice:  Diet: Enteral nutrition  Pain/Anxiety/Delirium protocol (if indicated): precedex, seroquel, fentany VAP protocol (if indicated): Yes DVT prophylaxis: lovenox GI prophylaxis: Famotidine Glucose control: SSI and basal rate Mobility: bed rest Code Status: Full Family Communication: Mother and adult son at bedside.  Disposition: He remains critically ill due to his need for ventilatory support.  He is slowly clinically improving.  He still volume overloaded.  He is failed extubation x2.  I think he will benefit from tracheostomy which we  are planning on later today.  Following tracheostomy placement will aggressively continue to wean mechanical ventilation.  We will also continue aggressive diuresis.  I think she will eventually be decannulated, for now he will continue to need aggressive ICU support   Critical care time: 35 minutes    Erick Colace ACNP-BC [place] Pager # [phone] OR # [phone] if no answer

## 2018-12-11 NOTE — Progress Notes (Signed)
Inpatient Diabetes Program Recommendations  AACE/ADA: New Consensus Statement on Inpatient Glycemic Control (2015)  Target Ranges:  Prepandial:   less than 140 mg/dL      Peak postprandial:   less than 180 mg/dL (1-2 hours)      Critically ill patients:  140 - 180 mg/dL   Lab Results  Component Value Date   GLUCAP 61 (L) 12/11/2018   HGBA1C 12.3 (H) 03/25/2017    Review of Glycemic Control Results for Antonio [name], Antonio [name] (MRN [id number]) as of 12/11/2018 08:27  Ref. Range 12/10/2018 23:22 12/11/2018 03:16 12/11/2018 08:19  Glucose-Capillary Latest Ref Range: 70 - 99 mg/dL 892 (H) 119 (H) 61 (L)   Diabetes history: Type 2 DM Outpatient Diabetes medications: Tresiba 70 units QHS, Humulin R 2-4 units for CBGs >150 mg/dL, Glipizide 10 mg BID Current orders for Inpatient glycemic control: Novolog 0-20 units Q4H, Levemir 60 units BID  ProStat 60 ml BID, Vital 1.5 CAL 55 ml/hr  Inpatient Diabetes Program Recommendations:    Noted hypoglycemia this AM of 61 mg/dL and that D5 IV fluids were added. Additionally, tube feeds were held for procedure this AM and will be restarted post procedure.  Spoke with RN to verify and review hypoglycemia protocol.  Will follow. If CBGs continue below 70 mg/dL, consider decreasing Levemir to 55 units BID.   Thanks, Lujean Rave, MSN, RNC-OB Diabetes Coordinator [phone] (8a-5p)

## 2018-12-11 NOTE — Progress Notes (Signed)
50 mL fentanyl wasted in sink--witnessed by Deland Pretty RN

## 2018-12-11 NOTE — Plan of Care (Signed)
  Problem: Nutrition: Goal: Adequate nutrition will be maintained Outcome: Progressing   Problem: Coping: Goal: Level of anxiety will decrease Outcome: Progressing   Problem: Safety: Goal: Ability to remain free from injury will improve Outcome: Progressing   Problem: Tissue Perfusion: Goal: Adequacy of tissue perfusion will improve Outcome: Progressing

## 2018-12-11 NOTE — Progress Notes (Signed)
Hypoglycemic Event  CBG: 61 @ 0819  Treatment: D50 25 mL (12.5 gm)  Symptoms: Nervous/irritable  Follow-up CBG: Time: 0850 CBG Result:100  Possible Reasons for Event: Tube feedings were turned off and pt was NPO at midnight for trach procedure  Comments/MD notified: NP Zenia Resides at bedside and was informed. Orders for D5 solution were placed while trach procedure was preformed.     Wille Glaser

## 2018-12-11 NOTE — Progress Notes (Signed)
Plans for trachoeostomy later today. We will see him tomorrow. Please call with questions.   Verne Carrow 12/11/2018 10:05 AM

## 2018-12-11 NOTE — Procedures (Addendum)
Procedure done by P Babcock ACNP-BC, under direct supervision of Dr Delton Coombes. At first bronch was introduce through ET tube and structures of tracheal rings, carina identified for operator of tracheostomy who was Dr Molli Knock. Light of bronch passed through trachea and skin for indentification of tracheal rings for tracheostomy puncture. After this, under bronchoscopy guidance,  ET tube was pulled back sufficiently and very carefully. The ET tube was  pulled back enough to give room for tracheostomy operator and yet at same time to to ensure a secured airway. After this was accomplished, bronchoscope was withdrawn into the ET tube. After this,  Dr Talmadge Chad  then performed tracheostomy under video visual provided by flexible video bronchoscopy. Followng introduction of tracheostomy,  the bronchoscope was removed from ET tube and introduced through tracheostomy. Correct position of tracheostomy was ensured, with enough room between carina and distal tracheostomy and no evidence of bleeding. The bronchoscope was then withdrawn. Respiratory therapist was then instructed to remove the ET tube.  Dr Molli Knock then proceeded to complete the tracheostomy with stay sutures   No complications   Simonne Martinet ACNP-BC [facility] Pulmonary/Critical Care Pager # [phone] OR # [phone] if no answer  I was present, scrubbed in and supervised the entire procedure.  Alyson Reedy, M.D. [facility] Pulmonary/Critical Care Medicine. Pager: [phone]. After hours pager: [phone].

## 2018-12-11 NOTE — Procedures (Addendum)
Bedside Tracheostomy Insertion Procedure Note   Patient Details:   Name: Antonio [name]. DOB: 08/18/1956 MRN: [id number]  Procedure: Tracheostomy  Pre Procedure Assessment: ET Tube Size: 8.0 ET Tube secured at lip (cm): 25 Bite block in place: No Breath Sounds: Clear  Post Procedure Assessment: BP 111/65 (BP Location: Left Arm)   Pulse 69   Temp (!) 97.4 F (36.3 C) (Axillary)   Resp 17   Ht 6' (1.829 m)   Wt 105.8 kg   SpO2 97%   BMI 31.63 kg/m  O2 sats: stable throughout Complications: No apparent complications Patient did tolerate procedure well Tracheostomy Brand:Shiley Tracheostomy Style:Cuffed Tracheostomy Size: 6.0  Tracheostomy Secured KCM:[id number], Velcro Tracheostomy Placement Confirmation:Trach cuff visualized and in place and Chest X ray ordered for placement    Lysbeth Penner [facility] [place] 12/11/2018, 10:54 AM

## 2018-12-12 ENCOUNTER — Inpatient Hospital Stay (HOSPITAL_COMMUNITY): Payer: Medicaid Other

## 2018-12-12 DIAGNOSIS — Z930 Tracheostomy status: Secondary | ICD-10-CM

## 2018-12-12 LAB — CBC
HCT: 30.9 % — ABNORMAL LOW (ref 39.0–52.0)
Hemoglobin: 9.5 g/dL — ABNORMAL LOW (ref 13.0–17.0)
MCH: 28.4 pg (ref 26.0–34.0)
MCHC: 30.7 g/dL (ref 30.0–36.0)
MCV: 92.2 fL (ref 80.0–100.0)
Platelets: 260 10*3/uL (ref 150–400)
RBC: 3.35 MIL/uL — ABNORMAL LOW (ref 4.22–5.81)
RDW: 13.7 % (ref 11.5–15.5)
WBC: 7.5 10*3/uL (ref 4.0–10.5)
nRBC: 0 % (ref 0.0–0.2)

## 2018-12-12 LAB — COMPREHENSIVE METABOLIC PANEL
ALK PHOS: 82 U/L (ref 38–126)
ALT: 22 U/L (ref 0–44)
AST: 22 U/L (ref 15–41)
Albumin: 1.5 g/dL — ABNORMAL LOW (ref 3.5–5.0)
Anion gap: 7 (ref 5–15)
BILIRUBIN TOTAL: 0.6 mg/dL (ref 0.3–1.2)
BUN: 32 mg/dL — AB (ref 6–20)
CO2: 28 mmol/L (ref 22–32)
Calcium: 8.1 mg/dL — ABNORMAL LOW (ref 8.9–10.3)
Chloride: 104 mmol/L (ref 98–111)
Creatinine, Ser: 0.93 mg/dL (ref 0.61–1.24)
GFR calc Af Amer: >60 mL/min (ref >60)
GFR calc non Af Amer: >60 mL/min (ref >60)
Glucose, Bld: 286 mg/dL — ABNORMAL HIGH (ref 70–99)
Potassium: 3.8 mmol/L (ref 3.5–5.1)
Sodium: 139 mmol/L (ref 135–145)
TOTAL PROTEIN: 6.2 g/dL — AB (ref 6.5–8.1)

## 2018-12-12 LAB — GLUCOSE, CAPILLARY
Glucose-Capillary: 156 mg/dL — ABNORMAL HIGH (ref 70–99)
Glucose-Capillary: 167 mg/dL — ABNORMAL HIGH (ref 70–99)
Glucose-Capillary: 168 mg/dL — ABNORMAL HIGH (ref 70–99)
Glucose-Capillary: 231 mg/dL — ABNORMAL HIGH (ref 70–99)
Glucose-Capillary: 243 mg/dL — ABNORMAL HIGH (ref 70–99)
Glucose-Capillary: 280 mg/dL — ABNORMAL HIGH (ref 70–99)

## 2018-12-12 MED ORDER — FENTANYL CITRATE (PF) 100 MCG/2ML IJ SOLN
50.0000 ug | INTRAMUSCULAR | Status: DC | PRN
  Administered 2018-12-12 – 2018-12-14 (×5): 75 ug via INTRAVENOUS
  Administered 2018-12-14: 50 ug via INTRAVENOUS
  Administered 2018-12-15 (×2): 75 ug via INTRAVENOUS
  Administered 2018-12-16 – 2018-12-18 (×3): 50 ug via INTRAVENOUS
  Administered 2018-12-18 – 2018-12-19 (×4): 75 ug via INTRAVENOUS
  Administered 2018-12-20 – 2018-12-21 (×2): 50 ug via INTRAVENOUS
  Administered 2018-12-22 – 2018-12-23 (×3): 75 ug via INTRAVENOUS
  Administered 2018-12-24 – 2018-12-25 (×2): 50 ug via INTRAVENOUS
  Administered 2018-12-25 – 2018-12-26 (×3): 75 ug via INTRAVENOUS
  Administered 2018-12-27: 50 ug via INTRAVENOUS
  Administered 2018-12-27: 75 ug via INTRAVENOUS
  Administered 2018-12-27: 50 ug via INTRAVENOUS
  Administered 2018-12-31: 75 ug via INTRAVENOUS
  Administered 2019-01-01: 50 ug via INTRAVENOUS
  Administered 2019-01-02 – 2019-01-05 (×6): 75 ug via INTRAVENOUS
  Filled 2018-12-12 (×35): qty 2

## 2018-12-12 MED ORDER — LORAZEPAM 2 MG/ML IJ SOLN
1.0000 mg | Freq: Three times a day (TID) | INTRAMUSCULAR | Status: DC | PRN
  Administered 2018-12-12 – 2018-12-26 (×9): 1 mg via INTRAVENOUS
  Filled 2018-12-12 (×10): qty 1

## 2018-12-12 MED ORDER — CLONAZEPAM 0.5 MG PO TABS
0.5000 mg | ORAL_TABLET | Freq: Two times a day (BID) | ORAL | Status: DC
  Administered 2018-12-12 – 2018-12-13 (×3): 0.5 mg
  Filled 2018-12-12 (×2): qty 1

## 2018-12-12 MED ORDER — BETHANECHOL CHLORIDE 10 MG PO TABS
10.0000 mg | ORAL_TABLET | Freq: Four times a day (QID) | ORAL | Status: DC
  Administered 2018-12-12 – 2018-12-30 (×73): 10 mg
  Filled 2018-12-12 (×67): qty 1

## 2018-12-12 MED ORDER — FUROSEMIDE 10 MG/ML IJ SOLN
80.0000 mg | Freq: Two times a day (BID) | INTRAMUSCULAR | Status: DC
  Administered 2018-12-12 – 2018-12-17 (×10): 80 mg via INTRAVENOUS
  Filled 2018-12-12 (×10): qty 8

## 2018-12-12 MED ORDER — QUETIAPINE FUMARATE 100 MG PO TABS
100.0000 mg | ORAL_TABLET | Freq: Two times a day (BID) | ORAL | Status: DC
  Administered 2018-12-12 – 2018-12-13 (×2): 100 mg
  Filled 2018-12-12 (×2): qty 1

## 2018-12-12 MED ORDER — FENTANYL CITRATE (PF) 100 MCG/2ML IJ SOLN
50.0000 ug | Freq: Once | INTRAMUSCULAR | Status: AC
  Administered 2018-12-12: 50 ug via INTRAVENOUS
  Filled 2018-12-12: qty 2

## 2018-12-12 MED ORDER — DEXMEDETOMIDINE HCL IN NACL 400 MCG/100ML IV SOLN
0.4000 ug/kg/h | INTRAVENOUS | Status: AC
  Administered 2018-12-12: 0.8 ug/kg/h via INTRAVENOUS
  Administered 2018-12-12: 1.2 ug/kg/h via INTRAVENOUS
  Administered 2018-12-12: 0.7 ug/kg/h via INTRAVENOUS
  Administered 2018-12-13 – 2018-12-14 (×10): 1.2 ug/kg/h via INTRAVENOUS
  Administered 2018-12-15: 1.1 ug/kg/h via INTRAVENOUS
  Administered 2018-12-15 (×2): 1.2 ug/kg/h via INTRAVENOUS
  Administered 2018-12-15 (×2): 1 ug/kg/h via INTRAVENOUS
  Administered 2018-12-16 – 2018-12-17 (×8): 1.1 ug/kg/h via INTRAVENOUS
  Administered 2018-12-17: 0.5 ug/kg/h via INTRAVENOUS
  Administered 2018-12-17 – 2018-12-18 (×2): 1 ug/kg/h via INTRAVENOUS
  Administered 2018-12-18: 0.6 ug/kg/h via INTRAVENOUS
  Administered 2018-12-18 – 2018-12-19 (×2): 0.4 ug/kg/h via INTRAVENOUS
  Administered 2018-12-20 – 2018-12-21 (×3): 0.3 ug/kg/h via INTRAVENOUS
  Administered 2018-12-21 – 2018-12-22 (×2): 0.4 ug/kg/h via INTRAVENOUS
  Filled 2018-12-12 (×3): qty 100
  Filled 2018-12-12: qty 200
  Filled 2018-12-12 (×2): qty 100
  Filled 2018-12-12: qty 200
  Filled 2018-12-12: qty 100
  Filled 2018-12-12: qty 200
  Filled 2018-12-12 (×21): qty 100
  Filled 2018-12-12: qty 200
  Filled 2018-12-12 (×7): qty 100

## 2018-12-12 MED ORDER — FENTANYL CITRATE (PF) 100 MCG/2ML IJ SOLN
50.0000 ug | INTRAMUSCULAR | Status: DC | PRN
  Administered 2018-12-12 (×2): 50 ug via INTRAVENOUS
  Filled 2018-12-12 (×2): qty 2

## 2018-12-12 NOTE — Plan of Care (Signed)
  Problem: Elimination: Goal: Will not experience complications related to bowel motility Outcome: Progressing   

## 2018-12-12 NOTE — Progress Notes (Signed)
Progress Note  Patient Name: Antonio [name]. Date of Encounter: 12/12/2018  Primary Cardiologist: Minus Breeding, MD   Subjective   Pt sitting in chair on vent/trach. No complaints  Inpatient Medications    Scheduled Meds: . aspirin  81 mg Per Tube Daily  . atorvastatin  40 mg Oral q1800  . chlorhexidine gluconate (MEDLINE KIT)  15 mL Mouth Rinse BID  . Chlorhexidine Gluconate Cloth  6 each Topical Daily  . collagenase   Topical Daily  . enoxaparin (LOVENOX) injection  40 mg Subcutaneous Daily  . famotidine  20 mg Per Tube BID  . feeding supplement (PRO-STAT SUGAR FREE 64)  60 mL Per Tube BID  . fentaNYL  75 mcg Transdermal Q72H  . folic acid  1 mg Per Tube Daily  . free water  250 mL Per Tube Q4H  . furosemide  80 mg Intravenous Q12H  . Gerhardt's butt cream   Topical BID  . insulin aspart  0-20 Units Subcutaneous Q4H  . insulin detemir  60 Units Subcutaneous BID  . ipratropium-albuterol  3 mL Nebulization TID  . mouth rinse  15 mL Mouth Rinse 10 times per day  . multivitamin  15 mL Oral Daily  . polyethylene glycol  17 g Per Tube Daily  . QUEtiapine  50 mg Per Tube BID  . sennosides  15 mL Per Tube BID  . sodium chloride flush  10-40 mL Intracatheter Q12H  . thiamine  100 mg Per Tube Daily  . vecuronium  10 mg Intravenous Once   Continuous Infusions: . dexmedetomidine    . erythromycin 500 mg (12/12/18 0557)  . feeding supplement (VITAL 1.5 CAL) 55 mL/hr at 12/12/18 0000   PRN Meds: acetaminophen (TYLENOL) oral liquid 160 mg/5 mL, albuterol, albuterol, bisacodyl, docusate, ibuprofen, sodium chloride flush   Vital Signs    Vitals:   12/12/18 0600 12/12/18 0630 12/12/18 0700 12/12/18 0723  BP: 133/71 118/89 118/63 119/74  Pulse: 74 73 73 76  Resp: (!) 22 (!) 22 (!) 22 (!) 22  Temp:    99.4 F (37.4 C)  TempSrc:    Oral  SpO2: 98% 98% 98% 98%  Weight:      Height:        Intake/Output Summary (Last 24 hours) at 12/12/2018 0857 Last data filed at  12/12/2018 0600 Gross per 24 hour  Intake 3768.39 ml  Output 2685 ml  Net 1083.39 ml   Last 3 Weights 12/12/2018 12/11/2018 12/06/2018  Weight (lbs) 239 lb 3.2 oz 233 lb 4 oz 217 lb 2.5 oz  Weight (kg) 108.5 kg 105.8 kg 98.5 kg      Telemetry    sinus - Personally Reviewed  ECG    No AM EKG- Personally Reviewed  Physical Exam   General: Well developed, well nourished, NAD On vent/trach sitting in chair HEENT: OP clear, mucus membranes moist  SKIN: warm, dry. No rashes. Neuro: No focal deficits  Musculoskeletal: Muscle strength 5/5 all ext  Psychiatric: Mood and affect normal  Neck: No JVD, no carotid bruits, no thyromegaly, no lymphadenopathy.  Lungs:Clear bilaterally, no wheezes, rhonci, crackles Cardiovascular: Regular rate and rhythm. No murmurs, gallops or rubs. Abdomen:Soft. Bowel sounds present. Non-tender.  Extremities: left BKA. No LE edema.   Labs    Chemistry Recent Labs  Lab 12/06/18 0540  12/08/18 0526  12/10/18 0506 12/11/18 0500 12/12/18 0545  NA 156*   < > 156*   < > 147* 147* 139  K 4.3   < >  3.9   < > 3.6 3.7 3.8  CL 122*   < > 124*   < > 110 110 104  CO2 27   < > 24   < > '27 28 28  ' GLUCOSE 229*   < > 124*   < > 122* 77 286*  BUN 54*   < > 39*   < > 33* 31* 32*  CREATININE 1.24   < > 1.10   < > 0.96 0.98 0.93  CALCIUM 7.5*   < > 7.5*   < > 8.1* 8.5* 8.1*  PROT 5.8*  --  5.8*  --   --   --  6.2*  ALBUMIN 1.6*  --  1.4*  --   --   --  1.5*  AST 21  --  39  --   --   --  22  ALT 23  --  25  --   --   --  22  ALKPHOS 62  --  70  --   --   --  82  BILITOT 0.4  --  0.5  --   --   --  0.6  GFRNONAA >60   < > >60   < > >60 >60 >60  GFRAA >60   < > >60   < > >60 >60 >60  ANIONGAP 7   < > 8   < > '10 9 7   ' < > = values in this interval not displayed.     Hematology Recent Labs  Lab 12/10/18 0506 12/11/18 0500 12/12/18 0545  WBC 9.1 10.1 7.5  RBC 3.63* 3.62* 3.35*  HGB 10.3* 10.3* 9.5*  HCT 34.2* 33.4* 30.9*  MCV 94.2 92.3 92.2  MCH 28.4 28.5  28.4  MCHC 30.1 30.8 30.7  RDW 13.7 13.4 13.7  PLT 250 268 260    Cardiac EnzymesNo results for input(s): TROPONINI in the last 168 hours. No results for input(s): TROPIPOC in the last 168 hours.   BNPNo results for input(s): BNP, PROBNP in the last 168 hours.   DDimer No results for input(s): DDIMER in the last 168 hours.   Radiology    Dg Chest Port 1 View  Result Date: 12/12/2018 CLINICAL DATA:  Pneumonia EXAM: PORTABLE CHEST 1 VIEW COMPARISON:  12/11/2018 FINDINGS: Tracheostomy tube is again seen and stable. Feeding catheter is noted extending into the stomach. Right-sided PICC line is noted at the cavoatrial junction. Cardiac shadow is mildly enlarged but stable. Small bilateral pleural effusions are seen. No focal confluent infiltrate is noted. IMPRESSION: Small effusions without focal infiltrate. Electronically Signed   By: Inez Catalina M.D.   On: 12/12/2018 07:29   Dg Chest Port 1 View  Result Date: 12/11/2018 CLINICAL DATA:  Tracheostomy. EXAM: PORTABLE CHEST 1 VIEW COMPARISON:  12/11/2018. FINDINGS: Interim tracheostomy. Feeding tube and right PICC line stable position. Cardiomegaly. Improved bilateral from interstitial prominence suggesting improving CHF. No pleural effusion or pneumothorax. IMPRESSION: 1. Interim tracheostomy. 2. Feeding tube and right PICC line stable position. 3. Improving CHF with mild residual pulmonary interstitial edema and small pleural effusions. Electronically Signed   By: Marcello Moores  Register   On: 12/11/2018 11:19   Dg Chest Port 1 View  Result Date: 12/11/2018 CLINICAL DATA:  Endotracheal tube placement. Pneumonia. EXAM: PORTABLE CHEST 1 VIEW COMPARISON:  12/10/2018 FINDINGS: Endotracheal tip is 4 cm above the carina. Soft feeding tube enters the abdomen. Right arm PICC tip in the right atrium. Slight worsening radiographic findings of bilateral  lower lobe pneumonia and volume loss. There may be a small amount of pleural fluid accumulating on the right  IMPRESSION: Slight worsening of bilateral lower lobe pneumonia and volume loss. Possible accumulating pleural fluid on the right. Electronically Signed   By: Nelson Chimes M.D.   On: 12/11/2018 07:42    Cardiac Studies   Echo 11/29/2018 Study Conclusions - Left ventricle: The cavity size was normal. Wall thickness was   normal. Systolic function was severely reduced. The estimated   ejection fraction was in the range of 20% to 25%. There is   akinesis of the anteroseptal, anterolateral, and apical   myocardium. Doppler parameters are consistent with abnormal left   ventricular relaxation (grade 1 diastolic dysfunction). - Pericardium, extracardiac: A small pericardial effusion was   identified circumferential to the heart. The fluid exhibited a   fibrinous appearance.There was no evidence of hemodynamic   compromise.  Patient Profile     63 y.o. male with PMH of CAD, ICM with previous baseline of 40%, HTN, HLD and DM II who presented with acute respiratory failure due to influenza. EF was 20-25% on echo. He was intubated and treated for cardiogenic shock and respiratory failure. Pending left and right heart failure once recover.   Assessment & Plan    1.  Acute respiratory failure in setting of influenza and complicated by corynebacterium PNA and pulmonary edema. PCCM trying to diurese to assist in weaning from the ventilator.   2.  Cardiomyopathy/acute on chronic systolic CHF: LVEF=-EF 38-88% on echo. Attempting diuresis. Will need right and left heart cath next week when pulmonary issues improved. Add beta blocker and ARB as BP tolerates  Lauree Chandler 12/12/2018 9:43 AM

## 2018-12-12 NOTE — Progress Notes (Signed)
SLP Cancellation Note  Patient Details Name: Antonio [name]. MRN: [id number] DOB: 1956-04-10   Cancelled treatment:         Patient with new tracheostomy. Orders for SLP eval and treat for PMSV and swallowing received. Will follow pt closely for readiness for SLP interventions as appropriate.     Royce Macadamia 12/12/2018, 7:54 AM   Breck Coons Lonell Face.Ed Nurse, children's [phone] Office [phone]

## 2018-12-12 NOTE — Progress Notes (Signed)
NAME:  Antonio Doolen., MRN:  [id number], DOB:  01/11/1956, LOS: 84 ADMISSION DATE:  11/28/2018, CONSULTATION DATE:  1/3 REFERRING MD:  1/3, CHIEF COMPLAINT:  Dyspnea  Brief History   63 y/o admitted with influenza A causing respiratory failure and cardiogenic shock.   Past Medical History  CAD, DM2, HTN, HLD  Significant Hospital Events   1/6 self extubated 1/7 re-intubated 1/8 self extubated 1/9 acute respiratory failure, mucus plugging right lower lobe, re-intubated, bronchoscopy 1/10 New fever T Max 103.1, antibiotics resumed with ceftaz and vancomycin 1/11: Hemodynamically seems to be improving.  Very hypernatremic.  X-ray improving.  Adding free water, one-time Lasix, follow-up chemistry a.m.  Hoping to initiate weaning efforts on 1/12 1/16.  Continuing diuresis and ongoing ventilatory support.  Working on weaning but still has significant work of breathing.  Plan has been to proceed with tracheostomy.  Hopefully we can continue ongoing diuresis and continue to decrease sedation Consults:  PCCM  Procedures:  ETT 1/3 >> 1/6, 1/7 > 1/8> 1/9 >> 1/16 1/9 Bronch>> Mucus plugging RLL Trach 1/16  (JY) >>   Significant Diagnostic Tests:  UDS 1/4 >> negative  ECHO 1/4 >> LVEF 20-25%, akinesis of the anteroseptal, anterolateral & apical myocardium, LVEF 00-17%, grade 1 diastolic dysfunction, small pericardial effusion without evidence of hemodynamic compromise CT Chest / ABD / Pelvis 1/3 >> airway thickening, motion artifact, CAD CXR 1/14> bilateral opacities, associated small pleural effusion  Micro Data:  RVP 1/3 >> positive for influenza A BCx2 1/3 >> No growth UC 1/4 >> No Growth Sputum Cx 1/7 >>Normal respiratory flora BAL>> corynebacterium  Blood Cx 1/10> NGTD  Antimicrobials:  Vanco 1/3 x1 Zosyn 1/30 x1  Tamiflu 1/3 >1/8 1/10 vancomycin>1/12 1/10 ceftaz >1/12 1/12 emycin >>> Interim history/subjective:  Trach placed 1/16 Currently tolerating PS 10 on  fentanyl 200, Precedex 0.7 Denies any pain or discomfort, denies dyspnea on pressure support   Objective   Blood pressure 118/63, pulse 73, temperature 98.5 F (36.9 C), temperature source Axillary, resp. rate (!) 22, height 6' (1.829 m), weight 108.5 kg, SpO2 98 %. CVP:  [9 mmHg-14 mmHg] 13 mmHg  Vent Mode: PRVC FiO2 (%):  [40 %] 40 % Set Rate:  [22 bmp] 22 bmp Vt Set:  [494 mL] 620 mL PEEP:  [5 cmH20] 5 cmH20 Plateau Pressure:  [16 cmH20-24 cmH20] 18 cmH20   Intake/Output Summary (Last 24 hours) at 12/12/2018 0819 Last data filed at 12/12/2018 0600 Gross per 24 hour  Intake 3768.39 ml  Output 2835 ml  Net 933.39 ml   Filed Weights   12/06/18 0432 12/11/18 0403 12/12/18 0405  Weight: 98.5 kg 105.8 kg 108.5 kg    Examination: General: Adult male, chronically ill-appearing, comfortable HEENT: Normocephalic, pupils equal, some dried blood at trach stoma site, no oral lesions Pulmonary: Ventilated, decreased bilaterally without any wheezing.  He does have some bibasilar crackles Cardiac: Regular, no murmur Abdomen: Soft, mild distention, nontender, hypoactive bowel sounds Extremities: Left BKA, no right lower extremity edema Neuro: Awake, alert, follow commands, nods to questions Skin: No rash, wound dressing intact   Resolved Hospital Problem list   Influenza A AKI  Assessment & Plan:    Acute respiratory failure with hypoxemia requiring mechanical ventilation secondary to influenza A, further complicated by -corynebacterium PNA, and pulmonary edema/volume overload Portable chest x-ray personally reviewed, demonstrates stable bilateral airspace disease, right greater than left.  Aeration a little bit worse on the right today. -Failed extubation x2 Plan Push pressure  support ventilation, hopefully he will be ready for trach collar soon.  Likely need to make progress both with his sedating medications and his volume overload in order to accomplish Needs more aggressive  diuresis Erythromycin for possible Corynebacterium pneumonia, day 6 of 10 VAP order set Follow chest x-ray Push physical therapy, mobilization  Hypotension requiring vasoactive medication, resolved -etiology likely CNS depressing medications Plan Norepinephrine weaned to off  Acute systolic heart failure -with chronic HTN Plan Add back beta blockade, ACE inhibitor over the next several days as blood pressure will tolerate Increased furosemide on 1/17, follow BMP and renal function Telemetry monitoring Will need to revisit possible cardiology work-up, catheterization as he stabilizes from this acute illness  Electrolyte abnormalities: Hypernatremia, hyperkalemia P Continue current free water.  Can stop D5W 1/17 Potassium replacement as ordered Follow BMP  Acute metabolic encephalopathy -ICU delirium, CNS depressing medications, this has improved significantly Plan Frequent reorientation RASS goal 0 Continue Seroquel, Precedex Fentanyl patch in place, 75 mcg Stop fentanyl drip on 1/17   Diabetes with acute hyperglycemia, had hypoglycemia this a.m. associated with holding tube feeds  Plan Levemir, sliding scale insulin Follow CBG on tube feeding  Medial buttocks pressure ulcer Plan Air mattress Continue Santyl and Gerhardt's wound care Mobilize as able Maximize nutrition  Malnutrition, at risk P Tube feeding  Constipation P Senna, MiraLAX  Best practice:  Diet: Enteral nutrition  Pain/Anxiety/Delirium protocol (if indicated): Seroquel, fentanyl patch VAP protocol (if indicated): Yes DVT prophylaxis: lovenox GI prophylaxis: Famotidine Glucose control: SSI and basal Levemir Mobility: bed rest Code Status: Full Family Communication: Mother and adult son at bedside 1/16, not present 1/17 Disposition: ICU status.  Would be a reasonable candidate for LTAC for continued vent weaning, rehab   Critical care time: 33 minutes     Baltazar Apo, MD,  PhD 12/12/2018, 8:32 AM [facility] [phone] or if no answer [phone]

## 2018-12-12 NOTE — Progress Notes (Signed)
PCCM interval note  Patient continues to have some agitation.  His Precedex ceiling was increased to 1.2, currently at that rate.  His Seroquel was also increased to 100 mg twice daily.  His fentanyl drip was discontinued this morning, currently has a 75 mcg patch.  On my evaluation he is tachypneic, is able to relate that he feels anxious, nods affirmative.  He is somewhat restless moving in the bed.  I will add low-dose clonazepam twice daily, temporarily use IV Ativan as needed with a goal to discontinue this when he is at stable clonazepam, Seroquel dosing.  Goal will be to wean Precedex to off.  Levy Pupaobert Gaelen Brager, MD, PhD 12/12/2018, 6:05 PM [facility] [phone] or if no answer [phone]

## 2018-12-12 NOTE — Progress Notes (Signed)
Nursing called. Pt extremely anxious. Seems  To be having increased pain. We did reduce precedex and stop gtt both today.  Plan Increase precedex gtt back to 1.2 ceiling Add back PRN fent I think we may need to consider higher patch dosing at next interval  Increasing seroquel to 100 bid  Simonne MartinetPeter E Jamiracle Avants ACNP-BC [facility] Pager # [phone] OR # [phone] if no answer

## 2018-12-12 NOTE — Progress Notes (Addendum)
Rehab Admissions Coordinator Note:  Patient was screened by Trish Mage for appropriateness for an Inpatient Acute Rehab Consult.  At this time, patient is on the vent.  Once he is off the vent, then may want to order an inpatient rehab consult.  Call me for questions.    Trish Mage 12/12/2018, 1:52 PM  I can be reached at [phone].

## 2018-12-12 NOTE — Evaluation (Signed)
Physical Therapy Evaluation Patient Details Name: Antonio [name]. MRN: [id number] DOB: [birth year] Today's Date: 12/12/2018   History of Present Illness  63 yo admitted with flu A and PNA with acute respiratory failure intubated 1/4, trach 1/16 with course complicated by acute MI, CHF and cardiogenic shock. PMhx: DM, HTN, HLD, CAD, Lt BKA, rt toe amputation  Clinical Impression  Pt on vent PRVC 40% with trach and mouthing statements throughout session. Pt reports Rt shoulder pain with touch and movement with decreased balance, activity tolerance and functional mobility who will benefit from acute therapy to maximize mobility and function to decrease burden of care. Pt with prosthesis at baseline not currently present but discussed with son bringing it. Pt determined to move but with increased RR to 40 with movement requiring 2 periods of breath support during transfer to chair with RN present and aware of lift on return. Pt fatigued end of session but happy to be moving.  94% SPo2 Hr 90    Follow Up Recommendations CIR;Supervision/Assistance - 24 hour    Equipment Recommendations  None recommended by PT    Recommendations for Other Services OT consult     Precautions / Restrictions Precautions Precautions: Fall Precaution Comments: cortrak, vent, trach, L BKA      Mobility  Bed Mobility Overal bed mobility: Needs Assistance Bed Mobility: Supine to Sit     Supine to sit: Mod assist     General bed mobility comments: mod assist to pivot fully to EOB and elevate trunk from surface  Transfers Overall transfer level: Needs assistance   Transfers: Sit to/from Stand;Lateral/Scoot Transfers Sit to Stand: Max assist;+2 physical assistance        Lateral/Scoot Transfers: Max assist;+2 physical assistance General transfer comment: max assist to stand on RLE from bed with bil UE support and bil knees blocked x 2 trials for pericare with pad placed then sequential scoot from  bed to drop arm recliner toward right with max +2 assist  Ambulation/Gait             General Gait Details: unable   Stairs            Wheelchair Mobility    Modified Rankin (Stroke Patients Only)       Balance Overall balance assessment: Needs assistance   Sitting balance-Leahy Scale: Fair   Postural control: Posterior lean;Left lateral lean Standing balance support: Bilateral upper extremity supported Standing balance-Leahy Scale: Zero Standing balance comment: posterior left lean in standing with max 2 person assist                             Pertinent Vitals/Pain Faces Pain Scale: Hurts even more Pain Location: right shoulder with movement Pain Intervention(s): Limited activity within patient's tolerance;Monitored during session;Repositioned    Home Living Family/patient expects to be discharged to:: Private residence Living Arrangements: Children;Parent Available Help at Discharge: Family;Available PRN/intermittently Type of Home: House Home Access: Level entry     Home Layout: Multi-level;Able to live on main level with bedroom/bathroom Home Equipment: Shower seat - built in;Hand held Dance movement psychotherapist - 4 wheels;Cane - single point Additional Comments: pt lives in basement of mother's house, parents on main level and son upstairs    Prior Function Level of Independence: Needs assistance   Gait / Transfers Assistance Needed: limited gait due to prosthesis and edema with rollator, transfers to scooter  ADL's / Homemaking Assistance Needed: was bathing and dressing assist  for cleaning        Hand Dominance        Extremity/Trunk Assessment   Upper Extremity Assessment Upper Extremity Assessment: Generalized weakness;RUE deficits/detail RUE Deficits / Details: pt reports baseline shoulder pain with flexion limited to grossly 80 due to pain     Lower Extremity Assessment Lower Extremity Assessment: LLE  deficits/detail LLE Deficits / Details: BKA    Cervical / Trunk Assessment Cervical / Trunk Assessment: Normal  Communication   Communication: Tracheostomy  Cognition Arousal/Alertness: Awake/alert Behavior During Therapy: Flat affect Overall Cognitive Status: Difficult to assess                                        General Comments      Exercises     Assessment/Plan    PT Assessment Patient needs continued PT services  PT Problem List Decreased strength;Decreased balance;Pain;Decreased mobility;Cardiopulmonary status limiting activity;Decreased activity tolerance       PT Treatment Interventions DME instruction;Functional mobility training;Balance training;Patient/family education;Gait training;Therapeutic activities;Neuromuscular re-education;Therapeutic exercise    PT Goals (Current goals can be found in the Care Plan section)  Acute Rehab PT Goals Patient Stated Goal: get up PT Goal Formulation: With patient Time For Goal Achievement: 12/26/18 Potential to Achieve Goals: Good    Frequency Min 3X/week   Barriers to discharge Decreased caregiver support son works and parents cannot provide 24hr assist as father with lung CA    Co-evaluation               AM-PAC PT "6 Clicks" Mobility  Outcome Measure Help needed turning from your back to your side while in a flat bed without using bedrails?: A Lot Help needed moving from lying on your back to sitting on the side of a flat bed without using bedrails?: A Lot Help needed moving to and from a bed to a chair (including a wheelchair)?: Total Help needed standing up from a chair using your arms (e.g., wheelchair or bedside chair)?: Total Help needed to walk in hospital room?: Total Help needed climbing 3-5 steps with a railing? : Total 6 Click Score: 8    End of Session Equipment Utilized During Treatment: Gait belt Activity Tolerance: Patient tolerated treatment well Patient left: in  chair;with call bell/phone within reach;with chair alarm set;with nursing/sitter in room Nurse Communication: Mobility status;Need for lift equipment PT Visit Diagnosis: Other abnormalities of gait and mobility (R26.89);Muscle weakness (generalized) (M62.81);Unsteadiness on feet (R26.81)    Time: 5093-2671 PT Time Calculation (min) (ACUTE ONLY): 32 min   Charges:   PT Evaluation $PT Eval High Complexity: 1 High          Myonna Chisom Abner Greenspan, PT Acute Rehabilitation Services Pager: [phone] Office: [phone]   Enedina Finner Anesia Blackwell 12/12/2018, 11:31 AM

## 2018-12-12 NOTE — Progress Notes (Signed)
Inpatient Diabetes Program Recommendations  AACE/ADA: New Consensus Statement on Inpatient Glycemic Control (2015)  Target Ranges:  Prepandial:   less than 140 mg/dL      Peak postprandial:   less than 180 mg/dL (1-2 hours)      Critically ill patients:  140 - 180 mg/dL   Lab Results  Component Value Date   GLUCAP 231 (H) 12/12/2018   HGBA1C 12.3 (H) 03/25/2017    Review of Glycemic Control Results for Antonio [name], Antonio [name] (MRN [id number]) as of 12/12/2018 09:20  Ref. Range 12/11/2018 23:43 12/12/2018 03:28 12/12/2018 07:28  Glucose-Capillary Latest Ref Range: 70 - 99 mg/dL 320 (H) 233 (H) 435 (H)    Diabetes history: Type 2 DM Outpatient Diabetes medications: Tresiba 70 units QHS, Humulin R 2-4 units for CBGs >150 mg/dL, Glipizide 10 mg BID Current orders for Inpatient glycemic control: Novolog 0-20 units Q4H, Levemir 60 units BID  ProStat 60 ml BID, Vital 1.5 CAL 55 ml/hr restarted at 1312 on 1/16  Inpatient Diabetes Program Recommendations:    No evidence of hypoglycemia, noted D5 discontinued this AM and tube feeds restarted. CBG trends are increased above inpatient goals of 180 mg/dL. Consider restarting tube feed coverage: Novolog 3 units Q4H.  (hold if tube feeds are held for any reason).  Thanks, Lujean Rave, MSN, RNC-OB Diabetes Coordinator [phone] (8a-5p)

## 2018-12-13 LAB — CBC
HCT: 32.4 % — ABNORMAL LOW (ref 39.0–52.0)
Hemoglobin: 10.4 g/dL — ABNORMAL LOW (ref 13.0–17.0)
MCH: 28.4 pg (ref 26.0–34.0)
MCHC: 32.1 g/dL (ref 30.0–36.0)
MCV: 88.5 fL (ref 80.0–100.0)
Platelets: 306 10*3/uL (ref 150–400)
RBC: 3.66 MIL/uL — ABNORMAL LOW (ref 4.22–5.81)
RDW: 13 % (ref 11.5–15.5)
WBC: 11.4 10*3/uL — AB (ref 4.0–10.5)
nRBC: 0 % (ref 0.0–0.2)

## 2018-12-13 LAB — BASIC METABOLIC PANEL
Anion gap: 11 (ref 5–15)
BUN: 28 mg/dL — ABNORMAL HIGH (ref 6–20)
CO2: 30 mmol/L (ref 22–32)
Calcium: 8.2 mg/dL — ABNORMAL LOW (ref 8.9–10.3)
Chloride: 101 mmol/L (ref 98–111)
Creatinine, Ser: 0.82 mg/dL (ref 0.61–1.24)
GFR calc non Af Amer: >60 mL/min (ref >60)
Glucose, Bld: 171 mg/dL — ABNORMAL HIGH (ref 70–99)
Potassium: 3.3 mmol/L — ABNORMAL LOW (ref 3.5–5.1)
Sodium: 142 mmol/L (ref 135–145)

## 2018-12-13 LAB — MAGNESIUM: Magnesium: 1.8 mg/dL (ref 1.7–2.4)

## 2018-12-13 LAB — GLUCOSE, CAPILLARY
GLUCOSE-CAPILLARY: 155 mg/dL — AB (ref 70–99)
Glucose-Capillary: 150 mg/dL — ABNORMAL HIGH (ref 70–99)
Glucose-Capillary: 186 mg/dL — ABNORMAL HIGH (ref 70–99)
Glucose-Capillary: 71 mg/dL (ref 70–99)
Glucose-Capillary: 57 mg/dL — ABNORMAL LOW (ref 70–99)
Glucose-Capillary: 139 mg/dL — ABNORMAL HIGH (ref 70–99)

## 2018-12-13 MED ORDER — POTASSIUM CHLORIDE CRYS ER 20 MEQ PO TBCR
40.0000 meq | EXTENDED_RELEASE_TABLET | Freq: Two times a day (BID) | ORAL | Status: DC
  Administered 2018-12-13 (×2): 40 meq via ORAL
  Filled 2018-12-13 (×3): qty 2

## 2018-12-13 MED ORDER — DEXTROSE 50 % IV SOLN
INTRAVENOUS | Status: AC
  Filled 2018-12-13: qty 50

## 2018-12-13 MED ORDER — DEXTROSE 50 % IV SOLN
12.5000 g | INTRAVENOUS | Status: AC
  Administered 2018-12-13: 12.5 g via INTRAVENOUS

## 2018-12-13 MED ORDER — QUETIAPINE FUMARATE 50 MG PO TABS
200.0000 mg | ORAL_TABLET | Freq: Two times a day (BID) | ORAL | Status: DC
  Administered 2018-12-13 – 2019-01-06 (×46): 200 mg
  Filled 2018-12-13 (×49): qty 1

## 2018-12-13 NOTE — Progress Notes (Signed)
NAME:  Antonio [name]., MRN:  [id number], DOB:  07-22-1956, LOS: 20 ADMISSION DATE:  11/28/2018, CONSULTATION DATE: January 3 REFERRING MD: EDP, CHIEF COMPLAINT: Dyspnea  Brief History   63 year old male with influenza A causing respiratory failure and cardiogenic shock.  Prolonged mechanical ventilation, tracheostomy placed on January 16.   Past Medical History  CAD, DM2, HTN, HLD  Significant Hospital Events   1/6 self extubated 1/7 re-intubated 1/8 self extubated 1/9 acute respiratory failure, mucus plugging right lower lobe, re-intubated, bronchoscopy 1/10 New fever T Max 103.1, antibiotics resumed with ceftaz and vancomycin 1/11: Hemodynamically seems to be improving.  Very hypernatremic.  X-ray improving.  Adding free water, one-time Lasix, follow-up chemistry a.m.  Hoping to initiate weaning efforts on 1/12 1/16.  Continuing diuresis and ongoing ventilatory support.  Working on weaning but still has significant work of breathing.  Plan has been to proceed with tracheostomy.  Hopefully we can continue ongoing diuresis and continue to decrease sedation Consults:  PCCM  Procedures:  ETT 1/3 >>1/6, 1/7 > 1/8> 1/9 >> 1/16 1/9 Bronch>> Mucus plugging RLL Trach 1/16  (JY) >>   Significant Diagnostic Tests:  UDS 1/4 >>negative  ECHO 1/4 >>LVEF 20-25%, akinesis of the anteroseptal, anterolateral & apical myocardium, LVEF 27-78%, grade 1 diastolic dysfunction, small pericardial effusion without evidence of hemodynamic compromise CT Chest / ABD / Pelvis 1/3 >> airway thickening, motion artifact, CAD CXR 1/14> bilateral opacities, associated small pleural effusion  Micro Data:  RVP 1/3 >> positive for influenza A BCx2 1/3 >>No growth UC 1/4 >>No Growth Sputum Cx 1/7 >>Normal respiratory flora BAL>> corynebacterium  Blood Cx 1/10> NGTD  Antimicrobials:  Vanco 1/3 x1 Zosyn 1/30 x1  Tamiflu 1/3 >1/8 1/10 vancomycin>1/12 1/10 ceftaz >1/12 1/12 emycin  >>>   Interim history/subjective:   Some agitation on January 17, clonazepam and Ativan added in addition to antipsychotics, tracheostomy was placed on January 16.  Was on pressure support on January 17.  Objective   Blood pressure (!) 157/73, pulse 98, temperature (!) 101.2 F (38.4 C), temperature source Axillary, resp. rate (!) 24, height 6' (1.829 m), weight 102.8 kg, SpO2 100 %. CVP:  [12 mmHg-14 mmHg] 12 mmHg  Vent Mode: PRVC FiO2 (%):  [30 %] 30 % Set Rate:  [22 bmp] 22 bmp Vt Set:  [242 mL] 620 mL PEEP:  [5 cmH20] 5 cmH20 Plateau Pressure:  [12 cmH20-28 cmH20] 27 cmH20   Intake/Output Summary (Last 24 hours) at 12/13/2018 1019 Last data filed at 12/13/2018 0600 Gross per 24 hour  Intake 3239.32 ml  Output 1850 ml  Net 1389.32 ml   Filed Weights   12/11/18 0403 12/12/18 0405 12/13/18 0500  Weight: 105.8 kg 108.5 kg 102.8 kg    Examination:  General:  In bed on vent HENT: NCAT trach in place PULM: vent supported breathing CV: RRR, no mgr GI: BS+, soft, nontender MSK: s/p BKA on L Neuro: awake, confused, pulling at tubes/lines    Resolved Hospital Problem list     Assessment & Plan:  Acute respiratory failure with hypoxemia: Influenza A, healthcare associated pneumonia > trach management per routine > continue pressure support > VAP prevention > consider trach collar  Acute pulmonary edema > continue lasix, may need to adjust dose > monitor I/O  Hypotension related to sedation > resolved > monitor hemodynamics  Acute systolic heart failure 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 > diurese > consider LHC this admission > consider b-blocker if BP stable  Acute metabolic  encephalopathy: still a problem, toxic metabolic enc/ICU delirium > frequent orientation > continue clonazepam > continue prn ativan > wean off precedex > increase clonazepam > monitor QTc  Diabetes mellitus with acute hyperglycemia > SSI  Constipation >  miralax  Hypokalemia > replace > check Mg, BMET in AM  Best practice:  Diet: NPO Pain/Anxiety/Delirium protocol (if indicated): precedex, as above VAP protocol (if indicated): yes DVT prophylaxis: lovenox GI prophylaxis: famotidine Glucose control: SSI Mobility: mobility protocol in bed Code Status: full Family Communication: none bedside Disposition: remain in ICU  Labs   CBC: Recent Labs  Lab 12/09/18 0448 12/10/18 0506 12/11/18 0500 12/12/18 0545 12/13/18 0500  WBC 8.6 9.1 10.1 7.5 11.4*  HGB 10.2* 10.3* 10.3* 9.5* 10.4*  HCT 35.2* 34.2* 33.4* 30.9* 32.4*  MCV 98.1 94.2 92.3 92.2 88.5  PLT 247 250 268 260 494    Basic Metabolic Panel: Recent Labs  Lab 12/09/18 0448 12/10/18 0506 12/11/18 0500 12/12/18 0545 12/13/18 0500  NA 151* 147* 147* 139 142  K 4.1 3.6 3.7 3.8 3.3*  CL 120* 110 110 104 101  CO2 _0 GLUCOSE 189* 122* 77 286* 171*  BUN 32* 33* 31* 32* 28*  CREATININE 1.04 0.96 0.98 0.93 0.82  CALCIUM 7.8* 8.1* 8.5* 8.1* 8.2*  MG  --   --   --   --  1.8   GFR: Estimated Creatinine Clearance: 118.8 mL/min (by C-G formula based on SCr of 0.82 mg/dL). Recent Labs  Lab 12/10/18 0506 12/11/18 0500 12/12/18 0545 12/13/18 0500  WBC 9.1 10.1 7.5 11.4*    Liver Function Tests: Recent Labs  Lab 12/08/18 0526 12/12/18 0545  AST 39 22  ALT 25 22  ALKPHOS 70 82  BILITOT 0.5 0.6  PROT 5.8* 6.2*  ALBUMIN 1.4* 1.5*   No results for input(s): LIPASE, AMYLASE in the last 168 hours. No results for input(s): AMMONIA in the last 168 hours.  ABG    Component Value Date/Time   PHART 7.282 (L) 12/07/2018 0349   PCO2ART 50.2 (H) 12/07/2018 0349   PO2ART 59.0 (L) 12/07/2018 0349   HCO3 23.4 12/07/2018 0349   TCO2 25 12/07/2018 0349   ACIDBASEDEF 3.0 (H) 12/07/2018 0349   O2SAT 84.0 12/07/2018 0349     Coagulation Profile: Recent Labs  Lab 12/10/18 1242  INR 1.17    Cardiac Enzymes: No results for input(s): CKTOTAL, CKMB,  CKMBINDEX, TROPONINI in the last 168 hours.  HbA1C: Hgb A1c MFr Bld  Date/Time Value Ref Range Status  03/25/2017 05:59 AM 12.3 (H) 4.8 - 5.6 % Final    Comment:    (NOTE)         Pre-diabetes: 5.7 - 6.4         Diabetes: >6.4         Glycemic control for adults with diabetes: <7.0   12/28/2015 08:50 PM 11.7 (H) 4.8 - 5.6 % Final    Comment:    (NOTE)         Pre-diabetes: 5.7 - 6.4         Diabetes: >6.4         Glycemic control for adults with diabetes: <7.0     CBG: Recent Labs  Lab 12/12/18 1553 12/12/18 1927 12/12/18 2332 12/13/18 0333 12/13/18 0806  GLUCAP 168* 167* 156* 150* 155*     Critical care time: 35 minutes    Roselie Awkward, MD [facility] Pager: [phone] Cell: [phone] If no response, call [phone]

## 2018-12-13 NOTE — Evaluation (Signed)
Occupational Therapy Evaluation Patient Details Name: Antonio RichardsDaniel Watson Kreamer Jr. MRN: [id number] DOB: December 14, 1955 Today's Date: 12/13/2018    History of Present Illness 63 yo admitted with flu A and PNA with acute respiratory failure intubated 1/4, trach 1/16 with course complicated by acute MI, CHF and cardiogenic shock. PMhx: DM, HTN, HLD, CAD, Lt BKA, rt toe amputation   Clinical Impression   PTA, pt was living with his parents and son and was independent with BADLs and functional transfers; PLOF and home information provided by son. Pt currently requiring Total A for ADLs and bed mobility due to decreased cognition and arousal. Pt with increased agitation and restlessness that RN reports was not pt's presentation yesterday. Pt not following commands and keeping his eye closed; arousal and awarness did not increase at EOB and returned to supine. VSS throughout session and RN and RT were present during bed mobility. Pt will require further acute OT to facilitate safe dc. Pending pt progress, recommend dc to CIR for further OT to optimize safety, independence with ADLs, and return to PLOF.      Follow Up Recommendations  CIR;Supervision/Assistance - 24 hour    Equipment Recommendations  Other (comment)(Defer to next venue)    Recommendations for Other Services Rehab consult;PT consult;Speech consult     Precautions / Restrictions Precautions Precautions: Fall Precaution Comments: cortrak, vent, trach, L BKA      Mobility Bed Mobility Overal bed mobility: Needs Assistance Bed Mobility: Supine to Sit;Sit to Supine     Supine to sit: Total assist;+2 for physical assistance;HOB elevated Sit to supine: Total assist;+2 for physical assistance   General bed mobility comments: Total A +2 for bed mobility due to decreased cognition and agitation.   Transfers                 General transfer comment: Defered due to agitation    Balance Overall balance assessment: Needs  assistance Sitting-balance support: No upper extremity supported;Feet supported Sitting balance-Leahy Scale: Zero Sitting balance - Comments: Requiring Total A for sitting balance                                   ADL either performed or assessed with clinical judgement   ADL Overall ADL's : Needs assistance/impaired                                       General ADL Comments: Pt requiring Total A for ADLs and bed mobiltiy due to poor congition, agitation, and decreased balance.      Vision         Perception     Praxis      Pertinent Vitals/Pain Pain Assessment: Faces Faces Pain Scale: Hurts even more Pain Location: Grimacing with movement Pain Descriptors / Indicators: Grimacing Pain Intervention(s): Monitored during session;Limited activity within patient's tolerance;Repositioned     Hand Dominance Right   Extremity/Trunk Assessment Upper Extremity Assessment Upper Extremity Assessment: Generalized weakness   Lower Extremity Assessment Lower Extremity Assessment: LLE deficits/detail;RLE deficits/detail RLE Deficits / Details: Great toe amputation right LLE Deficits / Details: BKA       Communication Communication Communication: Tracheostomy   Cognition Arousal/Alertness: Lethargic Behavior During Therapy: Restless;Flat affect;Agitated Overall Cognitive Status: Difficult to assess  General Comments: Pt keeping his eye closed throughout session. Not following commands. Repeating to move his left leg when in supine.    General Comments  Son and mother present at begining of session and provided home information. VSS throughout. RN and RT present during bed mobility    Exercises     Shoulder Instructions      Home Living Family/patient expects to be discharged to:: Private residence Living Arrangements: Children;Parent Available Help at Discharge: Available  PRN/intermittently;Family Type of Home: House Home Access: Level entry     Home Layout: Multi-level;Able to live on main level with bedroom/bathroom     Bathroom Shower/Tub: Producer, television/film/video: Standard     Home Equipment: Shower seat - built in;Hand held Dance movement psychotherapist - 4 wheels;Cane - single point   Additional Comments: pt lives in basement of mother's house, parents on main level and son upstairs      Prior Functioning/Environment Level of Independence: Needs assistance  Gait / Transfers Assistance Needed: limited gait due to prosthesis and edema with rollator, transfers to scooter ADL's / Homemaking Assistance Needed: Performed BADLs. Family assisted with IADLs.             OT Problem List: Decreased strength;Decreased range of motion;Decreased activity tolerance;Impaired balance (sitting and/or standing);Decreased safety awareness;Decreased knowledge of use of DME or AE;Decreased knowledge of precautions;Decreased cognition;Cardiopulmonary status limiting activity;Impaired UE functional use;Pain      OT Treatment/Interventions: Self-care/ADL training;Therapeutic exercise;Energy conservation;DME and/or AE instruction;Therapeutic activities;Patient/family education    OT Goals(Current goals can be found in the care plan section) Acute Rehab OT Goals Patient Stated Goal: "Return home" Son OT Goal Formulation: Patient unable to participate in goal setting Time For Goal Achievement: 12/27/18 Potential to Achieve Goals: Good ADL Goals Pt Will Perform Grooming: with min guard assist;sitting Pt Will Perform Upper Body Dressing: with min guard assist;sitting Pt Will Transfer to Toilet: with min assist;with transfer board;bedside commode Additional ADL Goal #1: Pt will perform bed mobility with Min A in preparation for ADLs Additional ADL Goal #2: Pt will sit at EOB for ~5 minutes with Min Guard A in preparation for ADLs Additional ADL Goal  #3: Pt will follow 50% on cues in quiet environement during ADL  OT Frequency: Min 2X/week   Barriers to D/C:            Co-evaluation              AM-PAC OT "6 Clicks" Daily Activity     Outcome Measure Help from another person eating meals?: Total Help from another person taking care of personal grooming?: Total Help from another person toileting, which includes using toliet, bedpan, or urinal?: Total Help from another person bathing (including washing, rinsing, drying)?: Total Help from another person to put on and taking off regular upper body clothing?: Total Help from another person to put on and taking off regular lower body clothing?: Total 6 Click Score: 6   End of Session Nurse Communication: Mobility status  Activity Tolerance: Treatment limited secondary to agitation Patient left: in bed;with call bell/phone within reach;with bed alarm set;with nursing/sitter in room;with restraints reapplied(RT present)  OT Visit Diagnosis: Unsteadiness on feet (R26.81);Other abnormalities of gait and mobility (R26.89);Muscle weakness (generalized) (M62.81);Other symptoms and signs involving cognitive function                Time: 6599-3570 OT Time Calculation (min): 21 min Charges:  OT General Charges $OT Visit: 1 Visit OT Evaluation $OT Eval  High Complexity: 1 High  Reason Helzer MSOT, OTR/L Acute Rehab Pager: [phone] Office: [phone]  Theodoro Grist Marilouise Densmore 12/13/2018, 12:25 PM

## 2018-12-14 LAB — GLUCOSE, CAPILLARY
GLUCOSE-CAPILLARY: 167 mg/dL — AB (ref 70–99)
GLUCOSE-CAPILLARY: 149 mg/dL — AB (ref 70–99)
GLUCOSE-CAPILLARY: 151 mg/dL — AB (ref 70–99)
Glucose-Capillary: 117 mg/dL — ABNORMAL HIGH (ref 70–99)
Glucose-Capillary: 160 mg/dL — ABNORMAL HIGH (ref 70–99)
Glucose-Capillary: 131 mg/dL — ABNORMAL HIGH (ref 70–99)
Glucose-Capillary: 154 mg/dL — ABNORMAL HIGH (ref 70–99)

## 2018-12-14 LAB — BASIC METABOLIC PANEL
Anion gap: 12 (ref 5–15)
Anion gap: 8 (ref 5–15)
BUN: 29 mg/dL — ABNORMAL HIGH (ref 6–20)
BUN: 28 mg/dL — ABNORMAL HIGH (ref 6–20)
CALCIUM: 6.3 mg/dL — AB (ref 8.9–10.3)
CO2: 30 mmol/L (ref 22–32)
CO2: 25 mmol/L (ref 22–32)
Calcium: 8 mg/dL — ABNORMAL LOW (ref 8.9–10.3)
Chloride: 102 mmol/L (ref 98–111)
Chloride: 113 mmol/L — ABNORMAL HIGH (ref 98–111)
Creatinine, Ser: 0.8 mg/dL (ref 0.61–1.24)
Creatinine, Ser: 0.69 mg/dL (ref 0.61–1.24)
GFR calc Af Amer: >60 mL/min (ref >60)
GFR calc Af Amer: >60 mL/min (ref >60)
GFR calc non Af Amer: >60 mL/min (ref >60)
GFR calc non Af Amer: >60 mL/min (ref >60)
Glucose, Bld: 164 mg/dL — ABNORMAL HIGH (ref 70–99)
Glucose, Bld: 151 mg/dL — ABNORMAL HIGH (ref 70–99)
Potassium: 3.2 mmol/L — ABNORMAL LOW (ref 3.5–5.1)
Potassium: 2.9 mmol/L — ABNORMAL LOW (ref 3.5–5.1)
Sodium: 144 mmol/L (ref 135–145)
Sodium: 146 mmol/L — ABNORMAL HIGH (ref 135–145)

## 2018-12-14 LAB — MAGNESIUM: MAGNESIUM: 2 mg/dL (ref 1.7–2.4)

## 2018-12-14 MED ORDER — POTASSIUM CHLORIDE 10 MEQ/50ML IV SOLN
10.0000 meq | INTRAVENOUS | Status: AC
  Administered 2018-12-14 (×4): 10 meq via INTRAVENOUS
  Filled 2018-12-14 (×4): qty 50

## 2018-12-14 MED ORDER — CALCIUM GLUCONATE-NACL 1-0.675 GM/50ML-% IV SOLN
1.0000 g | Freq: Once | INTRAVENOUS | Status: AC
  Administered 2018-12-14: 1000 mg via INTRAVENOUS
  Filled 2018-12-14: qty 50

## 2018-12-14 MED ORDER — POTASSIUM CHLORIDE 20 MEQ PO PACK
40.0000 meq | PACK | Freq: Two times a day (BID) | ORAL | Status: DC
  Administered 2018-12-14 – 2018-12-16 (×6): 40 meq via ORAL
  Filled 2018-12-14 (×7): qty 2

## 2018-12-14 MED ORDER — CLONAZEPAM 1 MG PO TABS
1.0000 mg | ORAL_TABLET | Freq: Two times a day (BID) | ORAL | Status: DC
  Administered 2018-12-14 – 2018-12-21 (×16): 1 mg
  Filled 2018-12-14 (×17): qty 1

## 2018-12-14 MED ORDER — POTASSIUM CHLORIDE 10 MEQ/50ML IV SOLN
10.0000 meq | INTRAVENOUS | Status: AC
  Administered 2018-12-14 – 2018-12-15 (×4): 10 meq via INTRAVENOUS
  Filled 2018-12-14 (×5): qty 50

## 2018-12-14 MED ORDER — POTASSIUM CHLORIDE 10 MEQ/50ML IV SOLN: 10.0000 meq | INTRAVENOUS | Status: DC

## 2018-12-14 NOTE — Progress Notes (Addendum)
eLink Physician-Brief Progress Note Patient Name: Antonio [name]. DOB: 09-17-1956 MRN: [id number]   Date of Service  12/14/2018  HPI/Events of Note  Persistent hypokalemia in the setting of ongoing diuresis and potassium replacement initiated on 1/18 K at that time was 3.3, now 3.2 despite 80 mEq potassium administered yesterday.  eICU Interventions  In addition to the 80 mEq already scheduled will give 40 additional mEq IV. Repeat BMET later in afternoon     Intervention Category Intermediate Interventions: Electrolyte abnormality - evaluation and management  Antonio [name] 12/14/2018, 6:09 AM

## 2018-12-14 NOTE — Progress Notes (Signed)
NAME:  Antonio Garno., MRN:  [id number], DOB:  [birth year], LOS: 43 ADMISSION DATE:  11/28/2018, CONSULTATION DATE: January 3 REFERRING MD: EDP, CHIEF COMPLAINT: Dyspnea  Brief History   63 year old male with influenza A causing respiratory failure and cardiogenic shock.  Prolonged mechanical ventilation, tracheostomy placed on January 16.   Past Medical History  CAD, DM2, HTN, HLD  Significant Hospital Events   1/6 self extubated 1/7 re-intubated 1/8 self extubated 1/9 acute respiratory failure, mucus plugging right lower lobe, re-intubated, bronchoscopy 1/10 New fever T Max 103.1, antibiotics resumed with ceftaz and vancomycin 1/11: Hemodynamically seems to be improving.  Very hypernatremic.  X-ray improving.  Adding free water, one-time Lasix, follow-up chemistry a.m.  Hoping to initiate weaning efforts on 1/12 1/16.  Continuing diuresis and ongoing ventilatory support.  Working on weaning but still has significant work of breathing.  Plan has been to proceed with tracheostomy.  Hopefully we can continue ongoing diuresis and continue to decrease sedation Consults:  PCCM  Procedures:  ETT 1/3 >>1/6, 1/7 > 1/8> 1/9 >> 1/16 1/9 Bronch>> Mucus plugging RLL Trach 1/16  (JY) >>   Significant Diagnostic Tests:  UDS 1/4 >>negative  ECHO 1/4 >>LVEF 20-25%, akinesis of the anteroseptal, anterolateral & apical myocardium, LVEF 40-98%, grade 1 diastolic dysfunction, small pericardial effusion without evidence of hemodynamic compromise CT Chest / ABD / Pelvis 1/3 >> airway thickening, motion artifact, CAD CXR 1/14> bilateral opacities, associated small pleural effusion  Micro Data:  RVP 1/3 >> positive for influenza A BCx2 1/3 >>No growth UC 1/4 >>No Growth Sputum Cx 1/7 >>Normal respiratory flora BAL>> corynebacterium  Blood Cx 1/10> ng  Antimicrobials:  Vanco 1/3 x1 Zosyn 1/30 x1  Tamiflu 1/3 >1/8 1/10 vancomycin>1/12 1/10 ceftaz >1/12 1/12 emycin  >>>   Interim history/subjective:   Continues to have agitation not weaning from ventilator Klonopin increased on 12/14/2018  Objective   Blood pressure 103/63, pulse 83, temperature 99.7 F (37.6 C), temperature source Axillary, resp. rate (!) 25, height 6' (1.829 m), weight 98.8 kg, SpO2 96 %. CVP:  [12 mmHg] 12 mmHg  Vent Mode: CPAP;PSV FiO2 (%):  [30 %-60 %] 30 % Set Rate:  [22 bmp] 22 bmp Vt Set:  [119 mL] 620 mL PEEP:  [5 cmH20] 5 cmH20 Pressure Support:  [12 cmH20] 12 cmH20 Plateau Pressure:  [18 cmH20-21 cmH20] 20 cmH20   Intake/Output Summary (Last 24 hours) at 12/14/2018 0829 Last data filed at 12/14/2018 0700 Gross per 24 hour  Intake 1500.12 ml  Output 2750 ml  Net -1249.88 ml   Filed Weights   12/12/18 0405 12/13/18 0500 12/14/18 0500  Weight: 108.5 kg 102.8 kg 98.8 kg    Examination:  General: Anxious frail male  HEENT: Tracheostomy connected to ventilator Neuro: Anxious CV: Heart sounds are distant PULM: even/non-labored, lungs bilaterall managed JY:NWGN, non-tender, bsx4 active  Extremities: Left BKA, partial removal right foot Skin: Stage III sacral decubitus  Neuro: awake, confused, pulling at tubes/lines    Resolved Hospital Problem list     Assessment & Plan:  Acute respiratory failure with hypoxemia: Influenza A, healthcare associated pneumonia -Trach management per vent bundle -Wean as tolerated -Anxiety presents block to successful weaning.  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  Known EF of 20 to 25% with akinesis of the anterior septal anterior lateral and apical myocardium grade 1 diastolic dysfunction -Diuresis  as tolerated, currently diuresing 1.2 L over 24 hours negative I&O -?  Left heart cath -Low-dose beta-blockade starting 2/67/1245  Acute metabolic encephalopathy:  still a problem, toxic metabolic enc/ICU delirium -Continue current sedation -Attempting to wean Precedex although he has extreme anxiety issue -Continue PRN benzodiazepines -Consideration for beta-blockade if blood pressure tolerates  Diabetes mellitus with acute hyperglycemia -Sliding scale insulin protocol  Constipation -MiraLAX been ordered  Hypokalemia -Repleted -Check serum electrolytes in a.m.  Best practice:  Diet: NPO Pain/Anxiety/Delirium protocol (if indicated): precedex, as above VAP protocol (if indicated): yes DVT prophylaxis: lovenox GI prophylaxis: famotidine Glucose control: SSI Mobility: mobility protocol in bed Code Status: full Family Communication: 12/14/2018 no family at bedside, patient updated sure. Disposition: remain in ICU  Labs   CBC: Recent Labs  Lab 12/09/18 0448 12/10/18 0506 12/11/18 0500 12/12/18 0545 12/13/18 0500  WBC 8.6 9.1 10.1 7.5 11.4*  HGB 10.2* 10.3* 10.3* 9.5* 10.4*  HCT 35.2* 34.2* 33.4* 30.9* 32.4*  MCV 98.1 94.2 92.3 92.2 88.5  PLT 247 250 268 260 809    Basic Metabolic Panel: Recent Labs  Lab 12/10/18 0506 12/11/18 0500 12/12/18 0545 12/13/18 0500 12/14/18 0431  NA 147* 147* 139 142 144  K 3.6 3.7 3.8 3.3* 3.2*  CL 110 110 104 101 102  CO2 _0 GLUCOSE 122* 77 286* 171* 164*  BUN 33* 31* 32* 28* 29*  CREATININE 0.96 0.98 0.93 0.82 0.80  CALCIUM 8.1* 8.5* 8.1* 8.2* 8.0*  MG  --   --   --  1.8 2.0   GFR: Estimated Creatinine Clearance: 119.6 mL/min (by C-G formula based on SCr of 0.8 mg/dL). Recent Labs  Lab 12/10/18 0506 12/11/18 0500 12/12/18 0545 12/13/18 0500  WBC 9.1 10.1 7.5 11.4*    Liver Function Tests: Recent Labs  Lab 12/08/18 0526 12/12/18 0545  AST 39 22  ALT 25 22  ALKPHOS 70 82  BILITOT 0.5 0.6  PROT 5.8* 6.2*  ALBUMIN 1.4* 1.5*   No results for input(s): LIPASE, AMYLASE in the last 168 hours. No results for input(s): AMMONIA in the last 168 hours.  ABG     Component Value Date/Time   PHART 7.282 (L) 12/07/2018 0349   PCO2ART 50.2 (H) 12/07/2018 0349   PO2ART 59.0 (L) 12/07/2018 0349   HCO3 23.4 12/07/2018 0349   TCO2 25 12/07/2018 0349   ACIDBASEDEF 3.0 (H) 12/07/2018 0349   O2SAT 84.0 12/07/2018 0349     Coagulation Profile: Recent Labs  Lab 12/10/18 1242  INR 1.17    Cardiac Enzymes: No results for input(s): CKTOTAL, CKMB, CKMBINDEX, TROPONINI in the last 168 hours.  HbA1C: Hgb A1c MFr Bld  Date/Time Value Ref Range Status  03/25/2017 05:59 AM 12.3 (H) 4.8 - 5.6 % Final    Comment:    (NOTE)         Pre-diabetes: 5.7 - 6.4         Diabetes: >6.4         Glycemic control for adults with diabetes: <7.0   12/28/2015 08:50 PM 11.7 (H) 4.8 - 5.6 % Final    Comment:    (NOTE)         Pre-diabetes: 5.7 - 6.4         Diabetes: >6.4         Glycemic control for adults with diabetes: <7.0     CBG: Recent Labs  Lab 12/13/18 1610 12/13/18 1920 12/13/18 2305 12/14/18 0308 12/14/18 0803  GLUCAP  71 57* 139* 160* 131*     Critical care time: app 30 min    Richardson Landry Rosio Weiss ACNP Maryanna Shape PCCM Pager [phone] till 1 pm If no answer page [phone] 12/14/2018, 8:30 AM 7

## 2018-12-15 ENCOUNTER — Inpatient Hospital Stay (HOSPITAL_COMMUNITY): Payer: Medicaid Other

## 2018-12-15 DIAGNOSIS — R0689 Other abnormalities of breathing: Secondary | ICD-10-CM

## 2018-12-15 LAB — BASIC METABOLIC PANEL
Anion gap: 12 (ref 5–15)
BUN: 41 mg/dL — ABNORMAL HIGH (ref 6–20)
CHLORIDE: 105 mmol/L (ref 98–111)
CO2: 30 mmol/L (ref 22–32)
Calcium: 8.3 mg/dL — ABNORMAL LOW (ref 8.9–10.3)
Creatinine, Ser: 0.95 mg/dL (ref 0.61–1.24)
GFR calc Af Amer: >60 mL/min (ref >60)
GFR calc non Af Amer: >60 mL/min (ref >60)
Glucose, Bld: 97 mg/dL (ref 70–99)
Potassium: 3.7 mmol/L (ref 3.5–5.1)
Sodium: 147 mmol/L — ABNORMAL HIGH (ref 135–145)

## 2018-12-15 LAB — CBC WITH DIFFERENTIAL/PLATELET
Abs Immature Granulocytes: 0.03 10*3/uL (ref 0.00–0.07)
BASOS ABS: 0 10*3/uL (ref 0.0–0.1)
Basophils Relative: 0 %
EOS PCT: 1 %
Eosinophils Absolute: 0.1 10*3/uL (ref 0.0–0.5)
HCT: 30.7 % — ABNORMAL LOW (ref 39.0–52.0)
Hemoglobin: 9.3 g/dL — ABNORMAL LOW (ref 13.0–17.0)
Immature Granulocytes: 0 %
Lymphocytes Relative: 17 %
Lymphs Abs: 1.3 10*3/uL (ref 0.7–4.0)
MCH: 27.9 pg (ref 26.0–34.0)
MCHC: 30.3 g/dL (ref 30.0–36.0)
MCV: 92.2 fL (ref 80.0–100.0)
Monocytes Absolute: 0.5 10*3/uL (ref 0.1–1.0)
Monocytes Relative: 6 %
Neutro Abs: 5.9 10*3/uL (ref 1.7–7.7)
Neutrophils Relative %: 76 %
Platelets: 329 10*3/uL (ref 150–400)
RBC: 3.33 MIL/uL — ABNORMAL LOW (ref 4.22–5.81)
RDW: 13.3 % (ref 11.5–15.5)
WBC: 7.7 10*3/uL (ref 4.0–10.5)
nRBC: 0 % (ref 0.0–0.2)

## 2018-12-15 LAB — MAGNESIUM: Magnesium: 2.2 mg/dL (ref 1.7–2.4)

## 2018-12-15 LAB — GLUCOSE, CAPILLARY
Glucose-Capillary: 105 mg/dL — ABNORMAL HIGH (ref 70–99)
Glucose-Capillary: 64 mg/dL — ABNORMAL LOW (ref 70–99)
Glucose-Capillary: 108 mg/dL — ABNORMAL HIGH (ref 70–99)
Glucose-Capillary: 91 mg/dL (ref 70–99)
Glucose-Capillary: 89 mg/dL (ref 70–99)
Glucose-Capillary: 142 mg/dL — ABNORMAL HIGH (ref 70–99)
Glucose-Capillary: 255 mg/dL — ABNORMAL HIGH (ref 70–99)

## 2018-12-15 LAB — PHOSPHORUS: Phosphorus: 5 mg/dL — ABNORMAL HIGH (ref 2.5–4.6)

## 2018-12-15 MED ORDER — MIDAZOLAM HCL 2 MG/2ML IJ SOLN
2.0000 mg | Freq: Once | INTRAMUSCULAR | Status: AC
  Administered 2018-12-15 – 2018-12-16 (×2): 2 mg via INTRAVENOUS
  Filled 2018-12-15: qty 2

## 2018-12-15 MED ORDER — DEXTROSE 50 % IV SOLN
INTRAVENOUS | Status: AC
  Administered 2018-12-15: 50 mL
  Filled 2018-12-15: qty 50

## 2018-12-15 MED ORDER — FENTANYL CITRATE (PF) 100 MCG/2ML IJ SOLN
INTRAMUSCULAR | Status: AC
  Filled 2018-12-15: qty 2

## 2018-12-15 MED ORDER — ACETYLCYSTEINE 20 % IN SOLN
4.0000 mL | Freq: Two times a day (BID) | RESPIRATORY_TRACT | Status: DC
  Administered 2018-12-15 – 2018-12-17 (×6): 4 mL via RESPIRATORY_TRACT
  Filled 2018-12-15 (×7): qty 4

## 2018-12-15 MED ORDER — FENTANYL CITRATE (PF) 100 MCG/2ML IJ SOLN
100.0000 ug | Freq: Once | INTRAMUSCULAR | Status: AC
  Administered 2018-12-15: 100 ug via INTRAVENOUS

## 2018-12-15 NOTE — Progress Notes (Signed)
RT NOTE: Holding CPT and Mucomyst until 2pm to give with duoneb. NP aware.

## 2018-12-15 NOTE — Progress Notes (Signed)
PT Cancellation Note  Patient Details Name: Antonio [name]. MRN: [id number] DOB: [birth year]   Cancelled Treatment:    Reason Eval/Treat Not Completed: Patient at procedure or test/unavailable.  Per RN pt is getting his trach changed.  PT will attempt back later as time allows, otherwise, we will see him tomorrow.  Thanks,  Antonio [name]. Antonio [name], PT, DPT  Acute Rehabilitation [phone] pager [phone]) [phone] office     Antonio [name] Antonio [name] 12/15/2018, 6:12 PM

## 2018-12-15 NOTE — Progress Notes (Signed)
Discussed foley catheter with Joneen Roach NP. Renae Fickle ordered me to keep in for a combination of factors: aggressive diuresis, need for accurate I&O, urinary retention, and sacral pressure ulcer.

## 2018-12-15 NOTE — Progress Notes (Addendum)
Progress Note  Patient Name: Antonio [name]. Date of Encounter: 12/15/2018  Primary Cardiologist: Minus Breeding, MD   Subjective   Awake on trach, resp rate elevated at times. Denies SOB or CP.  Inpatient Medications    Scheduled Meds: . aspirin  81 mg Per Tube Daily  . atorvastatin  40 mg Oral q1800  . bethanechol  10 mg Per Tube QID  . chlorhexidine gluconate (MEDLINE KIT)  15 mL Mouth Rinse BID  . Chlorhexidine Gluconate Cloth  6 each Topical Daily  . clonazePAM  1 mg Per Tube BID  . collagenase   Topical Daily  . enoxaparin (LOVENOX) injection  40 mg Subcutaneous Daily  . famotidine  20 mg Per Tube BID  . feeding supplement (PRO-STAT SUGAR FREE 64)  60 mL Per Tube BID  . fentaNYL  75 mcg Transdermal Q72H  . folic acid  1 mg Per Tube Daily  . free water  250 mL Per Tube Q4H  . furosemide  80 mg Intravenous Q12H  . Gerhardt's butt cream   Topical BID  . insulin aspart  0-20 Units Subcutaneous Q4H  . insulin detemir  60 Units Subcutaneous BID  . ipratropium-albuterol  3 mL Nebulization TID  . mouth rinse  15 mL Mouth Rinse 10 times per day  . multivitamin  15 mL Oral Daily  . polyethylene glycol  17 g Per Tube Daily  . potassium chloride  40 mEq Oral BID  . QUEtiapine  200 mg Per Tube BID  . sennosides  15 mL Per Tube BID  . sodium chloride flush  10-40 mL Intracatheter Q12H  . thiamine  100 mg Per Tube Daily  . vecuronium  10 mg Intravenous Once   Continuous Infusions: . dexmedetomidine 1 mcg/kg/hr (12/15/18 0912)  . erythromycin Stopped (12/15/18 8032)  . feeding supplement (VITAL 1.5 CAL) 55 mL/hr at 12/15/18 0400   PRN Meds: acetaminophen (TYLENOL) oral liquid 160 mg/5 mL, albuterol, albuterol, bisacodyl, docusate, fentaNYL (SUBLIMAZE) injection, ibuprofen, LORazepam, sodium chloride flush   Vital Signs    Vitals:   12/15/18 0600 12/15/18 0700 12/15/18 0722 12/15/18 0800  BP: 124/70 133/79    Pulse: 86 93    Resp: (!) 22 19    Temp:    99.5 F  (37.5 C)  TempSrc:    Oral  SpO2: 97% 99% 94%   Weight:      Height:        Intake/Output Summary (Last 24 hours) at 12/15/2018 1042 Last data filed at 12/15/2018 0700 Gross per 24 hour  Intake 3909.89 ml  Output 2831 ml  Net 1078.89 ml   Last 3 Weights 12/15/2018 12/14/2018 12/13/2018  Weight (lbs) 220 lb 10.9 oz 217 lb 13 oz 226 lb 10.1 oz  Weight (kg) 100.1 kg 98.8 kg 102.8 kg      Telemetry    SR, ST, no sig ectopy - Personally Reviewed  ECG    None today - Personally Reviewed  Physical Exam   General: Well developed, male, awake and alert on the vent Head: Eyes PERRLA, No xanthomas.   Normocephalic and atraumatic Lungs: rales and rhonchi, upper airway congestion heard as well. Heart: HRRR S1 S2, without MRG (resp noise limits auscultation).  Pulses are 2+ & equal. No JVD seen, not able to assess 2nd equipment. Abdomen: Bowel sounds are present, abdomen soft and non-tender without masses or  hernias noted. Msk: Weak strength and tone for age. Extremities: No clubbing, cyanosis or edema. S/p  L BKA, RLE w/ decreased distal pulse but cap refill normal  Skin:  No rashes or lesions noted. Neuro: No focal deficits Psych:  Good affect, responds appropriately  Labs    Chemistry Recent Labs  Lab 12/12/18 0545  12/14/18 0431 12/14/18 1928 12/15/18 0500  NA 139   < > 144 146* 147*  K 3.8   < > 3.2* 2.9* 3.7  CL 104   < > 102 113* 105  CO2 28   < > '30 25 30  ' GLUCOSE 286*   < > 164* 151* 97  BUN 32*   < > 29* 28* 41*  CREATININE 0.93   < > 0.80 0.69 0.95  CALCIUM 8.1*   < > 8.0* 6.3* 8.3*  PROT 6.2*  --   --   --   --   ALBUMIN 1.5*  --   --   --   --   AST 22  --   --   --   --   ALT 22  --   --   --   --   ALKPHOS 82  --   --   --   --   BILITOT 0.6  --   --   --   --   GFRNONAA >60   < > >60 >60 >60  GFRAA >60   < > >60 >60 >60  ANIONGAP 7   < > '12 8 12   ' < > = values in this interval not displayed.     Hematology Recent Labs  Lab 12/12/18 0545  12/13/18 0500 12/15/18 0543  WBC 7.5 11.4* 7.7  RBC 3.35* 3.66* 3.33*  HGB 9.5* 10.4* 9.3*  HCT 30.9* 32.4* 30.7*  MCV 92.2 88.5 92.2  MCH 28.4 28.4 27.9  MCHC 30.7 32.1 30.3  RDW 13.7 13.0 13.3  PLT 260 306 329    Cardiac EnzymesNo results for input(s): TROPONINI in the last 168 hours. No results for input(s): TROPIPOC in the last 168 hours.   BNPNo results for input(s): BNP, PROBNP in the last 168 hours.    Radiology    Dg Chest Port 1 View  Result Date: 12/15/2018 CLINICAL DATA:  Respiratory failure.  Pneumonia. EXAM: PORTABLE CHEST 1 VIEW COMPARISON:  Three days ago FINDINGS: Tracheostomy tube remains well seated. Feeding tube that at least reaches the stomach. Right upper extremity PICC with tip at the upper cavoatrial junction. Haziness of the lower chest that is unchanged. No Kerley lines, definite effusion, or pneumothorax. Mild cardiomegaly. IMPRESSION: Stable and unremarkable hardware positioning. History of pneumonia with unchanged opacity at the lung bases. Electronically Signed   By: Monte Fantasia M.D.   On: 12/15/2018 06:11    Cardiac Studies   Echo 11/29/2018 Study Conclusions - Left ventricle: The cavity size was normal. Wall thickness was   normal. Systolic function was severely reduced. The estimated   ejection fraction was in the range of 20% to 25%. There is   akinesis of the anteroseptal, anterolateral, and apical   myocardium. Doppler parameters are consistent with abnormal left   ventricular relaxation (grade 1 diastolic dysfunction). - Pericardium, extracardiac: A small pericardial effusion was   identified circumferential to the heart. The fluid exhibited a   fibrinous appearance.There was no evidence of hemodynamic   compromise.  Patient Profile     63 y.o. male with PMH of CAD, ICM with previous baseline of 40%, HTN, HLD and DM II who presented 01/03 with acute respiratory failure due to  influenza. EF was 20-25% on echo. He was intubated and  treated for cardiogenic shock and respiratory failure>>trach 01/16. Pending left and right heart cath once recovered more.  Assessment & Plan    1.  Acute respiratory failure in setting of influenza and complicated by corynebacterium PNA and pulmonary edema.  - diuresis and trach management per CCM - not yet able to wean  2.  Cardiomyopathy/acute on chronic systolic CHF:  - currently on Lasix 80 mg IV BID  - However, I/O for the last 24 hr is 4444/3284= +1.16 L - R/L heart cath once more stable - SBP 80s at times overnight, on ASA, high-dose statin, but no ACE/ARB/BB   Rosaria Ferries 12/15/2018 10:42 AM  History and all data above reviewed.  Patient examined.  I agree with the findings as above.  Denies pain or SOB.  The patient exam reveals COR:RRR  ,  Lungs: Decreased breath sounds  ,  Abd: Positive bowel sounds, no rebound no guarding, Ext No edema  .  All available labs, radiology testing, previous records reviewed. Agree with documented assessment and plan.   Cardiomyopathy:  Likely ischemic but with possible old inferior MI on [place] Myoview last year.  EF was 40% but refused echo or follow up.   BP will not allow med titration.  Will consider cath at some point but can be elective if he will allow.     Jeneen Rinks Mashayla Lavin  12:35 PM  12/15/2018

## 2018-12-15 NOTE — Progress Notes (Signed)
NAME:  Antonio Winkels., MRN:  [id number], DOB:  01/12/1956, LOS: 45 ADMISSION DATE:  11/28/2018, CONSULTATION DATE: January 3 REFERRING MD: EDP, CHIEF COMPLAINT: Dyspnea  Brief History   63 year old male with influenza A causing respiratory failure and cardiogenic shock.  Prolonged mechanical ventilation, tracheostomy placed on January 16.   Past Medical History  CAD, DM2, HTN, HLD  Significant Hospital Events   1/6 self extubated 1/7 re-intubated 1/8 self extubated 1/9 acute respiratory failure, mucus plugging right lower lobe, re-intubated, bronchoscopy 1/10 New fever T Max 103.1, antibiotics resumed with ceftaz and vancomycin 1/11: Hemodynamically seems to be improving.  Very hypernatremic.  X-ray improving.  Adding free water, one-time Lasix, follow-up chemistry a.m.  Hoping to initiate weaning efforts on 1/12 1/16.  Continuing diuresis and ongoing ventilatory support.  Working on weaning but still has significant work of breathing.  Plan has been to proceed with tracheostomy.  Hopefully we can continue ongoing diuresis and continue to decrease sedation 1/20 still struggling to wean. Secretions and anxiety are barriers.   Consults:  PCCM  Procedures:  ETT 1/3 >>1/6, 1/7 > 1/8> 1/9 >> 1/16 1/9 Bronch>> Mucus plugging RLL Trach 1/16  (JY) >>   Significant Diagnostic Tests:  UDS 1/4 >>negative  ECHO 1/4 >>LVEF 20-25%, akinesis of the anteroseptal, anterolateral & apical myocardium, LVEF 14-78%, grade 1 diastolic dysfunction, small pericardial effusion without evidence of hemodynamic compromise CT Chest / ABD / Pelvis 1/3 >> airway thickening, motion artifact, CAD CXR 1/14> bilateral opacities, associated small pleural effusion  Micro Data:  RVP 1/3 >> positive for influenza A BCx2 1/3 >>No growth UC 1/4 >>No Growth Sputum Cx 1/7 >>Normal respiratory flora BAL>> corynebacterium  Blood Cx 1/10> ng  Antimicrobials:  Vanco 1/3 x1 Zosyn 1/30 x1  Tamiflu 1/3  >1/8 1/10 vancomycin>1/12 1/10 ceftaz >1/12 1/12 emycin > 1/20  Interim history/subjective:  Two episodes of desaturation this morning remedied by tracheal suctioning. Anxiety appears pretty well controlled today, however, he has not attempted to wean yet today.   Objective   Blood pressure 133/79, pulse 93, temperature 99.5 F (37.5 C), temperature source Oral, resp. rate 19, height 6' (1.829 m), weight 100.1 kg, SpO2 94 %. CVP:  [4 mmHg-11 mmHg] 11 mmHg  Vent Mode: PRVC FiO2 (%):  [30 %-50 %] 40 % Set Rate:  [22 bmp] 22 bmp Vt Set:  [620 mL] 620 mL PEEP:  [5 cmH20] 5 cmH20 Pressure Support:  [12 cmH20] 12 cmH20 Plateau Pressure:  [18 cmH20-26 cmH20] 21 cmH20   Intake/Output Summary (Last 24 hours) at 12/15/2018 1023 Last data filed at 12/15/2018 0700 Gross per 24 hour  Intake 3909.89 ml  Output 2831 ml  Net 1078.89 ml   Filed Weights   12/13/18 0500 12/14/18 0500 12/15/18 0500  Weight: 102.8 kg 98.8 kg 100.1 kg    Examination:  General: adult male in NAD HEENT: Tracheostomy connected to ventilator Neuro: somnolent, but easily arouses and follows commands.  CV: RRR, no MRG PULM: even/non-labored, ronchi GN:FAOZ, non-tender, bsx4 active  Extremities: Left BKA, partial removal right foot Skin: Sacral decub.   Resolved Hospital Problem list   Sedation related hypotension:  Assessment & Plan:  Acute respiratory failure with hypoxemia: Influenza A, healthcare associated pneumonia (corynbacterium on sputum ). Course not complicated by secretions.  -Trach management per vent bundle -Wean as tolerated to ATC -Mucomyst nebs -Chest PT -Continue erythromycin for 10 day course (stop after 1/22 dose) - Mobilize  Tracheostomy status: Cuff leak today 1/20 -  Will likely need tracheostomy change.  - Will ask attending to evaluate as trach is only 40 days old at this point.   Acute pulmonary edema -Continue diuresis, cardiology following.  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  Known EF of 20 to 25% with akinesis of the anterior septal anterior lateral and apical myocardium grade 1 diastolic dysfunction -Diuresis as tolerated, currently diuresing 1.2 L over 24 hours negative I&O -Will need cardiac cath once more stable. Cardiology following -Low-dose beta-blockade  Acute metabolic encephalopathy: still a problem, toxic metabolic enc/ICU delirium -Continue current sedation medications  Diabetes mellitus with acute hyperglycemia -Sliding scale insulin protocol  Constipation -MiraLAX been ordered  Hypokalemia -Repleted -Follow BMP  Best practice:  Diet: TF Pain/Anxiety/Delirium protocol (if indicated): oral anxiolytic agents VAP protocol (if indicated): yes DVT prophylaxis: lovenox GI prophylaxis: famotidine Glucose control: SSI Mobility: OOB to chair Code Status: full Family Communication: 12/14/2018 no family at bedside, patient updated sure. Disposition: remain in ICU   Labs   CBC: Recent Labs  Lab 12/10/18 0506 12/11/18 0500 12/12/18 0545 12/13/18 0500 12/15/18 0543  WBC 9.1 10.1 7.5 11.4* 7.7  NEUTROABS  --   --   --   --  5.9  HGB 10.3* 10.3* 9.5* 10.4* 9.3*  HCT 34.2* 33.4* 30.9* 32.4* 30.7*  MCV 94.2 92.3 92.2 88.5 92.2  PLT 250 268 260 306 354    Basic Metabolic Panel: Recent Labs  Lab 12/12/18 0545 12/13/18 0500 12/14/18 0431 12/14/18 1928 12/15/18 0500  NA 139 142 144 146* 147*  K 3.8 3.3* 3.2* 2.9* 3.7  CL 104 101 102 113* 105  CO2 _0 GLUCOSE 286* 171* 164* 151* 97  BUN 32* 28* 29* 28* 41*  CREATININE 0.93 0.82 0.80 0.69 0.95  CALCIUM 8.1* 8.2* 8.0* 6.3* 8.3*  MG  --  1.8 2.0  --  2.2  PHOS  --   --   --   --  5.0*   GFR: Estimated Creatinine Clearance: 101.3 mL/min (by C-G formula based on SCr of 0.95 mg/dL). Recent Labs  Lab 12/11/18 0500 12/12/18 0545 12/13/18 0500 12/15/18 0543  WBC 10.1 7.5 11.4* 7.7    Liver Function  Tests: Recent Labs  Lab 12/12/18 0545  AST 22  ALT 22  ALKPHOS 82  BILITOT 0.6  PROT 6.2*  ALBUMIN 1.5*   No results for input(s): LIPASE, AMYLASE in the last 168 hours. No results for input(s): AMMONIA in the last 168 hours.  ABG    Component Value Date/Time   PHART 7.282 (L) 12/07/2018 0349   PCO2ART 50.2 (H) 12/07/2018 0349   PO2ART 59.0 (L) 12/07/2018 0349   HCO3 23.4 12/07/2018 0349   TCO2 25 12/07/2018 0349   ACIDBASEDEF 3.0 (H) 12/07/2018 0349   O2SAT 84.0 12/07/2018 0349     Coagulation Profile: Recent Labs  Lab 12/10/18 1242  INR 1.17    Cardiac Enzymes: No results for input(s): CKTOTAL, CKMB, CKMBINDEX, TROPONINI in the last 168 hours.  HbA1C: Hgb A1c MFr Bld  Date/Time Value Ref Range Status  03/25/2017 05:59 AM 12.3 (H) 4.8 - 5.6 % Final    Comment:    (NOTE)         Pre-diabetes: 5.7 - 6.4         Diabetes: >6.4         Glycemic control for adults with diabetes: <7.0   12/28/2015 08:50 PM 11.7 (H) 4.8 -  5.6 % Final    Comment:    (NOTE)         Pre-diabetes: 5.7 - 6.4         Diabetes: >6.4         Glycemic control for adults with diabetes: <7.0     CBG: Recent Labs  Lab 12/14/18 1926 12/14/18 2325 12/15/18 0334 12/15/18 0747 12/15/18 0836  GLUCAP 149* 151* 105* 64* 108*     Critical care time: 30 mins     Georgann Housekeeper, AGACNP-BC [place] Pager [phone] or [phone]  12/15/2018 10:27 AM

## 2018-12-15 NOTE — Progress Notes (Signed)
Trach change done at this time by MD due to large air leak. #6 shiley removed but MD unable to reinsert #6shiley while using bougie. Unsuccessful with bronchoscope as well. Spo2 in 80's at this time.  Stoma covered for short time and bagged with BVM. Spo2 improved. #4 shiley then inserted. Confirmed with bronchoscope, +etco2 color change.

## 2018-12-15 NOTE — Progress Notes (Signed)
RT NOTE:   RT holding chest PT, due to trach change. RT to resume at next scheduled time. RT will continue to monitor

## 2018-12-16 ENCOUNTER — Inpatient Hospital Stay (HOSPITAL_COMMUNITY): Payer: Medicaid Other

## 2018-12-16 DIAGNOSIS — Z430 Encounter for attention to tracheostomy: Secondary | ICD-10-CM

## 2018-12-16 DIAGNOSIS — R0902 Hypoxemia: Secondary | ICD-10-CM

## 2018-12-16 LAB — BASIC METABOLIC PANEL
Anion gap: 9 (ref 5–15)
BUN: 36 mg/dL — ABNORMAL HIGH (ref 6–20)
CO2: 28 mmol/L (ref 22–32)
Calcium: 8 mg/dL — ABNORMAL LOW (ref 8.9–10.3)
Chloride: 106 mmol/L (ref 98–111)
Creatinine, Ser: 1.09 mg/dL (ref 0.61–1.24)
GFR calc Af Amer: >60 mL/min (ref >60)
GFR calc non Af Amer: >60 mL/min (ref >60)
Glucose, Bld: 336 mg/dL — ABNORMAL HIGH (ref 70–99)
POTASSIUM: 5 mmol/L (ref 3.5–5.1)
Sodium: 143 mmol/L (ref 135–145)

## 2018-12-16 LAB — GLUCOSE, CAPILLARY
GLUCOSE-CAPILLARY: 119 mg/dL — AB (ref 70–99)
Glucose-Capillary: 102 mg/dL — ABNORMAL HIGH (ref 70–99)
Glucose-Capillary: 140 mg/dL — ABNORMAL HIGH (ref 70–99)
Glucose-Capillary: 163 mg/dL — ABNORMAL HIGH (ref 70–99)
Glucose-Capillary: 166 mg/dL — ABNORMAL HIGH (ref 70–99)
Glucose-Capillary: 193 mg/dL — ABNORMAL HIGH (ref 70–99)
Glucose-Capillary: 58 mg/dL — ABNORMAL LOW (ref 70–99)

## 2018-12-16 LAB — CBC
HEMATOCRIT: 31.3 % — AB (ref 39.0–52.0)
HEMOGLOBIN: 9.4 g/dL — AB (ref 13.0–17.0)
MCH: 28.1 pg (ref 26.0–34.0)
MCHC: 30 g/dL (ref 30.0–36.0)
MCV: 93.7 fL (ref 80.0–100.0)
Platelets: 353 10*3/uL (ref 150–400)
RBC: 3.34 MIL/uL — ABNORMAL LOW (ref 4.22–5.81)
RDW: 13.7 % (ref 11.5–15.5)
WBC: 9 10*3/uL (ref 4.0–10.5)
nRBC: 0 % (ref 0.0–0.2)

## 2018-12-16 MED ORDER — SODIUM CHLORIDE 0.9 % IV SOLN
2.0000 g | Freq: Three times a day (TID) | INTRAVENOUS | Status: DC
  Administered 2018-12-16 – 2018-12-18 (×5): 2 g via INTRAVENOUS
  Filled 2018-12-16 (×7): qty 2

## 2018-12-16 MED ORDER — VANCOMYCIN HCL 10 G IV SOLR
1500.0000 mg | Freq: Two times a day (BID) | INTRAVENOUS | Status: DC
  Administered 2018-12-17 – 2018-12-20 (×7): 1500 mg via INTRAVENOUS
  Filled 2018-12-16 (×9): qty 1500

## 2018-12-16 MED ORDER — DEXTROSE 50 % IV SOLN
12.5000 g | INTRAVENOUS | Status: AC
  Administered 2018-12-16: 12.5 g via INTRAVENOUS

## 2018-12-16 MED ORDER — ROCURONIUM BROMIDE 50 MG/5ML IV SOLN
50.0000 mg | Freq: Once | INTRAVENOUS | Status: AC
  Administered 2018-12-16: 50 mg via INTRAVENOUS

## 2018-12-16 MED ORDER — MIDAZOLAM HCL 2 MG/2ML IJ SOLN
INTRAMUSCULAR | Status: AC
  Administered 2018-12-16: 2 mg via INTRAVENOUS
  Filled 2018-12-16: qty 2

## 2018-12-16 MED ORDER — VANCOMYCIN HCL 10 G IV SOLR
2000.0000 mg | Freq: Once | INTRAVENOUS | Status: AC
  Administered 2018-12-16: 2000 mg via INTRAVENOUS
  Filled 2018-12-16: qty 2000

## 2018-12-16 MED ORDER — FENTANYL CITRATE (PF) 100 MCG/2ML IJ SOLN
INTRAMUSCULAR | Status: AC
  Administered 2018-12-16: 100 ug
  Filled 2018-12-16: qty 2

## 2018-12-16 MED ORDER — DEXTROSE 50 % IV SOLN
INTRAVENOUS | Status: AC
  Filled 2018-12-16: qty 50

## 2018-12-16 MED ORDER — INSULIN DETEMIR 100 UNIT/ML ~~LOC~~ SOLN
45.0000 [IU] | Freq: Two times a day (BID) | SUBCUTANEOUS | Status: DC
  Administered 2018-12-16 – 2019-01-01 (×27): 45 [IU] via SUBCUTANEOUS
  Filled 2018-12-16 (×33): qty 0.45

## 2018-12-16 MED ORDER — DEXTROSE 10 % IV SOLN
INTRAVENOUS | Status: DC
  Administered 2018-12-16: 03:00:00 via INTRAVENOUS

## 2018-12-16 MED ORDER — ETOMIDATE 2 MG/ML IV SOLN
20.0000 mg | Freq: Once | INTRAVENOUS | Status: AC
  Administered 2018-12-16: 20 mg via INTRAVENOUS

## 2018-12-16 NOTE — Procedures (Signed)
Bronchoscopy Procedure Note Antonio [name] [id number] 07/22/1956  Procedure: Bronchoscopy Indications: Diagnostic evaluation of the airways  Procedure Details Consent: Risks of procedure as well as the alternatives and risks of each were explained to the (patient/caregiver).  Consent for procedure obtained. Time Out: Verified patient identification, verified procedure, site/side was marked, verified correct patient position, special equipment/implants available, medications/allergies/relevent history reviewed, required imaging and test results available.  Performed  In preparation for procedure, patient was given 100% FiO2 and bronchoscope lubricated. Sedation: Etomidate  Paralytic: Rocuronium   Prior to tracheostomy revision, bronchoscope was introduced via ETT. Airway entered and the following bronchi were examined: trachea and mainstem with stom noted. During revision, wire was inserted via stoma and directly visualized entering the trachea extending into the left main. Tracheostomy was dilated and #6 distal XLT cuffed trach was inserted. Wire was removed and bronchoscope withdrawn.  Procedures performed: Direct visualization of wire in mainstem   Evaluation Hemodynamic Status: BP stable throughout; O2 sats: stable throughout Patient's Current Condition: stable Specimens:  None Complications: No apparent complications Patient did tolerate procedure well.   Ladora Osterberg Mechele Collin 12/16/2018

## 2018-12-16 NOTE — Procedures (Signed)
Tracheostomy revision  Patient leaking and stoma closing.  Patient was sedated and paralyzed then intubated.  Janina Mayo was removed.  Wire placed and visualized bronchoscopically.  Stoma cut and dilated then size 6 distal XLT cuffed trach placed and visualized bronchoscopically above carina.  Alyson Reedy, M.D. [facility] Pulmonary/Critical Care Medicine. Pager: [phone]. After hours pager: [phone].

## 2018-12-16 NOTE — Progress Notes (Signed)
Pharmacy Antibiotic Note  Antonio [name]. is a 63 y.o. male admitted on 11/28/2018 with influenza A, respiratory failure, and cardiogenic shock with prolonged mechanical ventilation and tracheostomy. Patient has been difficult to wean from vent due to increased secretions requiring trach revision today. Patient was noted to have a new fever concerning for new pneumonia.  Pharmacy has been consulted for Vancomycin and Ceftazidime dosing.   Tmax 102.4 - new as fever curve had been declining. WBC remained within normal limits this AM. SCr has been slowing increasing at 1.09 with estimated CrCl ~ 87 mL/min. BUN is 36. Patient is still making good urine at ~1.7 mL/kg/hr the last 24 hours.   Plan: Ceftazidime 2 g IV every 8 hours. Vancomycin 2000 mg IV x1, then Vancomycin 1500 mg IV Q 12 hrs.  Goal AUC 400-550. Expected AUC: 498 SCr used: 1.09 Monitor SCr trend and UOP, if SCr continues to rise may need to change to pulse dosing.  Monitor clinical status, culture results, and ability to de-escalate therapy.    Height: 6' (182.9 cm) Weight: 213 lb 10 oz (96.9 kg) IBW/kg (Calculated) : 77.6  Temp (24hrs), Avg:99.9 F (37.7 C), Min:98.6 F (37 C), Max:102.4 F (39.1 C)  Recent Labs  Lab 12/11/18 0500 12/12/18 0545 12/13/18 0500 12/14/18 0431 12/14/18 1928 12/15/18 0500 12/15/18 0543 12/16/18 0856  WBC 10.1 7.5 11.4*  --   --   --  7.7 9.0  CREATININE 0.98 0.93 0.82 0.80 0.69 0.95  --  1.09    Estimated Creatinine Clearance: 87 mL/min (by C-G formula based on SCr of 1.09 mg/dL).    Allergies  Allergen Reactions  . Iodinated Diagnostic Agents Hives, Rash and Other (See Comments)    Blisters Staph Blisters / staph  . Penicillins Other (See Comments)    UNSPECIFIED REACTION  Has patient had a PCN reaction causing immediate rash, facial/tongue/throat swelling, SOB or lightheadedness with hypotension: No Has patient had a PCN reaction causing severe rash involving mucus  membranes or skin necrosis: No Has patient had a PCN reaction that required hospitalization: No Has patient had a PCN reaction occurring within the last 10 years: No If all of the above answers are "NO", then may proceed with Cephalosporin use.    Antimicrobials this admission: 1/10 vancomycin >1/12; 1/21 >> 1/10 ceftazidime >1/12; 1/21 >> 1/12 eryth >> (1/22)  Dose adjustments this admission:   Microbiology results: 1/21 Tracheal aspirate >> 1/9 resp cx:mod diphtheroids (corynebacterium) 1/10 blood cx:  negF 1/9 resp cx: NF 1/7 resp cx: NF 1/3 blood cx:neg 1/4 urine cx: neg 1/4 mrsa pcr: neg 1/3 RVP: +flu A  Thank you for allowing pharmacy to be a part of this patient's care.  Link Snuffer, PharmD, BCPS, BCCCP Clinical Pharmacist Please refer to [facility] for [facility] Pharmacy numbers 12/16/2018 4:49 PM

## 2018-12-16 NOTE — Procedures (Addendum)
Intubation Procedure Note Rayon Mcchristian [id number] Mar 30, 1956  Procedure: Intubation Indications: Airway protection and maintenance  Procedure Details Consent: Risks of procedure as well as the alternatives and risks of each were explained to the (patient/caregiver).  Consent for procedure obtained. Time Out: Verified patient identification, verified procedure, site/side was marked, verified correct patient position, special equipment/implants available, medications/allergies/relevent history reviewed, required imaging and test results available.  Performed  Maximum sterile technique was used including gloves, gown, hand hygiene and mask.  MAC    Evaluation Hemodynamic Status: BP stable throughout; O2 sats: stable throughout Patient's Current Condition: stable Complications: No apparent complications Patient did tolerate procedure well. Chest X-ray ordered to verify placement.  CXR: pending.   Obed K Agyei 12/16/2018  I was present and supervised the entire procedure.  Intubated with difficulty.  Rush Farmer, M.D. [facility] Pulmonary/Critical Care Medicine. Pager: [phone]. After hours pager: [phone]

## 2018-12-16 NOTE — Progress Notes (Signed)
eLink Physician-Brief Progress Note Patient Name: Antonio [name]. DOB: Oct 28, 1956 MRN: [id number]   Date of Service  12/16/2018  HPI/Events of Note  Patient vomited tube feeds.   eICU Interventions  Will order: 1. NGT to LIS.  2. D10W to run IV at 20 mL/hour. 3. Hold tube feeds until re-evaluated by rounding team in AM.      Intervention Category Major Interventions: Other:  Sommer,Steven Dennard Nip 12/16/2018, 2:27 AM

## 2018-12-16 NOTE — Progress Notes (Signed)
Physical Therapy Treatment Patient Details Name: Antonio [name]. MRN: [id number] DOB: June 13, 1956 Today's Date: 12/16/2018    History of Present Illness 63 yo admitted with flu A and PNA with acute respiratory failure intubated 1/4, trach 1/16 with course complicated by acute MI, CHF and cardiogenic shock. PMhx: DM, HTN, HLD, CAD, Lt BKA, rt toe amputation    PT Comments    Pt limited by respiratory condition. Pt able to assist with lateral transfer to the chair however RR into 60s. SpO2 >92% t/o session. Pt complete 1 stand however unable to pivot. Recommended patient to have family bring in prosthesis. Pt with surprisingly good strength considering is hospital course. OOB mobility however currently limited by respiratory condition. Acute PT to cont to follow.    Follow Up Recommendations  CIR;Supervision/Assistance - 24 hour     Equipment Recommendations  None recommended by PT    Recommendations for Other Services       Precautions / Restrictions Precautions Precautions: Fall Precaution Comments: cortrak, vent, trach, L BKA Restrictions Weight Bearing Restrictions: No    Mobility  Bed Mobility Overal bed mobility: Needs Assistance Bed Mobility: Rolling;Sidelying to Sit Rolling: Mod assist;+2 for physical assistance Sidelying to sit: Mod assist;+2 for physical assistance       General bed mobility comments: pt anxious and grabbing with UEs versus listening to commands, maxA to maintain sidelying for hygiene s/p loose stool, modA for trunk elevation and safety, increased RR into 60s with mobility  Transfers Overall transfer level: Needs assistance Equipment used: 2 person hand held assist(with use of bed pad) Transfers: Sit to/from Stand;Lateral/Scoot Transfers Sit to Stand: Max assist;+2 physical assistance        Lateral/Scoot Transfers: Mod assist;+2 safety/equipment General transfer comment: completed stand x1, unable to pvt pt to chair due to significant  resistance from patient and patients agitation, pt able to use both hands and min/modAx2 at bed pad to lateral scoot over to drop arm chair  Ambulation/Gait             General Gait Details: unable   Stairs             Wheelchair Mobility    Modified Rankin (Stroke Patients Only)       Balance Overall balance assessment: Needs assistance Sitting-balance support: No upper extremity supported;Feet supported Sitting balance-Leahy Scale: Fair Sitting balance - Comments: when pt supports self with hands pt minA to maintain EOB balance when pt moves UE pt requires UE to maintain EOB blance Postural control: Posterior lean;Left lateral lean Standing balance support: Bilateral upper extremity supported Standing balance-Leahy Scale: Zero Standing balance comment: posterior left lean in standing with max 2 person assist                            Cognition Arousal/Alertness: Awake/alert Behavior During Therapy: Restless;Impulsive Overall Cognitive Status: Impaired/Different from baseline Area of Impairment: Safety/judgement                         Safety/Judgement: Decreased awareness of safety     General Comments: pt agitated with amount of lines and vent, pt impulsive with movement to the chair      Exercises      General Comments General comments (skin integrity, edema, etc.): pt with breakdown on bottom, with dressing on it, pt dependent for hygiene s/p BM      Pertinent Vitals/Pain Pain Assessment: Faces Faces  Pain Scale: Hurts even more Pain Location: Grimacing with movement Pain Descriptors / Indicators: Grimacing Pain Intervention(s): Monitored during session    Home Living                      Prior Function            PT Goals (current goals can now be found in the care plan section) Acute Rehab PT Goals Patient Stated Goal: "get rid of this shit" Progress towards PT goals: Progressing toward goals     Frequency    Min 3X/week      PT Plan Current plan remains appropriate    Co-evaluation PT/OT/SLP Co-Evaluation/Treatment: Yes Reason for Co-Treatment: Complexity of the patient's impairments (multi-system involvement) PT goals addressed during session: Mobility/safety with mobility        AM-PAC PT "6 Clicks" Mobility   Outcome Measure  Help needed turning from your back to your side while in a flat bed without using bedrails?: A Lot Help needed moving from lying on your back to sitting on the side of a flat bed without using bedrails?: A Lot Help needed moving to and from a bed to a chair (including a wheelchair)?: A Lot Help needed standing up from a chair using your arms (e.g., wheelchair or bedside chair)?: A Lot Help needed to walk in hospital room?: Total Help needed climbing 3-5 steps with a railing? : Total 6 Click Score: 10    End of Session Equipment Utilized During Treatment: Gait belt Activity Tolerance: Patient tolerated treatment well Patient left: in chair;with call bell/phone within reach;with chair alarm set Nurse Communication: Mobility status;Need for lift equipment PT Visit Diagnosis: Other abnormalities of gait and mobility (R26.89);Muscle weakness (generalized) (M62.81);Unsteadiness on feet (R26.81)     Time: 3536-1443 PT Time Calculation (min) (ACUTE ONLY): 45 min  Charges:  $Therapeutic Activity: 23-37 mins                     [name], PT, DPT Acute Rehabilitation Services Pager #: [phone] Office #: [phone]    [name] 12/16/2018, 8:56 AM

## 2018-12-16 NOTE — Progress Notes (Signed)
PCCM INTERVAL PROGRESS NOTE  Despite trach change, Antonio [name] remains hypoxemic requiring 80% FiO2 to keep O2 sats in low 90s. He is having thick airway secretions concerning for infection.   Plan: CXR Broaden ABX to ceftaz and vanco Repeat tracheal aspirate culture.    Joneen Roach, AGACNP-BC [facility] Pulmonary/Critical Care Pager [phone] or [phone]  12/16/2018 4:28 PM

## 2018-12-16 NOTE — Progress Notes (Addendum)
While in the room pt noted to have vomited tube feeds both out of his mouth as well as around his trach.  Tube feeds immediately turned off and Elink MD notified.  Order to place NG and hook up to low intermittent suction given as well as start D10 d/t the pt receiving a large amount of Levemir earlier in the shift.

## 2018-12-16 NOTE — Progress Notes (Signed)
NG tube unable to be placed by 3 different RN's, Elink notified and informed to hold off on pulling coretrack for right now.  If pt vomits again verbal order given to pull coretrack and place NG through same nostril for low intermittent suction.  Will continue to monitor.

## 2018-12-16 NOTE — Progress Notes (Addendum)
NAME:  Antonio [name]., MRN:  [id number], DOB:  1955/12/23, LOS: 48 ADMISSION DATE:  11/28/2018, CONSULTATION DATE: January 3 REFERRING MD: EDP, CHIEF COMPLAINT: Dyspnea  Brief History   63 year old male with influenza A causing respiratory failure and cardiogenic shock.  Prolonged mechanical ventilation, tracheostomy placed on January 16.   Past Medical History  CAD, DM2, HTN, HLD  Significant Hospital Events   1/6 self extubated 1/7 re-intubated 1/8 self extubated 1/9 acute respiratory failure, mucus plugging right lower lobe, re-intubated, bronchoscopy 1/10 New fever T Max 103.1, antibiotics resumed with ceftaz and vancomycin 1/11: Hemodynamically seems to be improving.  Very hypernatremic.  X-ray improving.  Adding free water, one-time Lasix, follow-up chemistry a.m.  Hoping to initiate weaning efforts on 1/12 1/16.  Continuing diuresis and ongoing ventilatory support.  Working on weaning but still has significant work of breathing.  Plan has been to proceed with tracheostomy.  Hopefully we can continue ongoing diuresis and continue to decrease sedation 1/20 still struggling to wean. Secretions and anxiety are barriers.   Consults:  PCCM  Procedures:  ETT 1/3 >>1/6, 1/7 > 1/8> 1/9 >> 1/16 1/9 Bronch>> Mucus plugging RLL Trach 1/16  (JY) >> 1/20 trach change failed and #4 cuffed 1/21 trach upsized to 6 cuffed XLT   Significant Diagnostic Tests:  UDS 1/4 >>negative  ECHO 1/4 >>LVEF 20-25%, akinesis of the anteroseptal, anterolateral & apical myocardium, LVEF 90-30%, grade 1 diastolic dysfunction, small pericardial effusion without evidence of hemodynamic compromise CT Chest / ABD / Pelvis 1/3 >> airway thickening, motion artifact, CAD CXR 1/14> bilateral opacities, associated small pleural effusion  Micro Data:  RVP 1/3 >> positive for influenza A BCx2 1/3 >>No growth UC 1/4 >>No Growth Sputum Cx 1/7 >> Normal respiratory flora BAL >> corynebacterium  Blood Cx  1/10 > ng  Sputum 1/21 >  Antimicrobials:  Vanco 1/3 x1 Zosyn 1/30 x1  Tamiflu 1/3 >1/8 1/10 vancomycin>1/12 1/10 ceftaz >1/12 1/12 emycin > 1/22  Interim history/subjective:  Significant cuff leak on exam yesterday. Attempted to change trach with Dr. Lynetta Mare, however, this was difficult. Unable to change to another # 6 due to resistance when placing despite using tube changer and bronchoscope. Ultimately had to place #4 shiley. Today he continues to have large leak when in bedside chair.   Objective   Blood pressure 105/67, pulse 77, temperature 98.9 F (37.2 C), temperature source Oral, resp. rate (!) 22, height 6' (1.829 m), weight 96.9 kg, SpO2 95 %. CVP:  [11 mmHg-12 mmHg] 11 mmHg  Vent Mode: PRVC FiO2 (%):  [40 %-60 %] 60 % Set Rate:  [22 bmp] 22 bmp Vt Set:  [620 mL] 620 mL PEEP:  [5 cmH20] 5 cmH20 Plateau Pressure:  [14 cmH20-24 cmH20] 14 cmH20   Intake/Output Summary (Last 24 hours) at 12/16/2018 0820 Last data filed at 12/16/2018 0700 Gross per 24 hour  Intake 2098.55 ml  Output 3935 ml  Net -1836.45 ml   Filed Weights   12/14/18 0500 12/15/18 0500 12/16/18 0514  Weight: 98.8 kg 100.1 kg 96.9 kg    Examination:  General: adult male in NAD HEENT: Tracheostomy on vent. Audible cuff leak.  Neuro: awake, alert, responding appropriately.  CV: Tachy, regular, after moving to bedside chair.  PULM: even/non-labored, transmitted upper airway sounds.  SP:QZRA, non-tender, bsx4 active  Extremities: Left BKA, partial removal right foot Skin: stage 2 sacral decub  Resolved Hospital Problem list   Sedation related hypotension:  Assessment & Plan:  Acute respiratory  failure with hypoxemia: Influenza A, healthcare associated pneumonia (corynbacterium in sputum ). Course now complicated by respiratory secretions.  Lurline Idol management per vent bundle - Will try on ATC today, if he is unable to tolerate, he may end up needing to have trach removed and dilated/upsized to #6  distal XLT. (failed ATC, cant progress him due to severe cuff leak, will discuss with Dr Nelda Marseille who placed trache regarding options) - Mucomyst nebs - Chest PT - Continue erythromycin for 10 day course (stop after 1/22 dose) - Mobilize as able (limited by cuff leak)  Tracheostomy status: Cuff leak today 1/21 despite trach change.  - as above  Acute pulmonary edema - Continue diuresis, cardiology following.  - Remains positive I&O daily  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  Known EF of 20 to 25% with akinesis of the anterior septal anterior lateral and apical myocardium grade 1 diastolic dysfunction - Diuresis as tolerated, currently diuresing 1.8 L over last 24 hours - Elective cardiac cath warranted if he will allow.  - Low-dose beta-blockade - Cardiology following, appreciate their recs.  - Check BMP  Acute metabolic encephalopathy: improving, toxic metabolic encephalopathy/ICU delirium -Continue current sedation medications (Precedex, lorazepam, seroquel)  Diabetes mellitus with acute hyperglycemia - Sliding scale insulin protocol - Reducing Lantus as he has been hypoglycemic in the mornings and tube feeds temporarily held.   Constipation - MiraLAX been ordered  Hypokalemia - Repleted - Follow BMP  Vomiting - KUB to r/o ileus - hold TF until resulted.   Best practice:  Diet: TF Pain/Anxiety/Delirium protocol (if indicated): oral anxiolytic agents VAP protocol (if indicated): yes DVT prophylaxis: lovenox GI prophylaxis: famotidine Glucose control: SSI Mobility: OOB to chair Code Status: full Family Communication: 12/14/2018 no family at bedside, patient updated sure. Disposition: remain in ICU   Labs   CBC: Recent Labs  Lab 12/10/18 0506 12/11/18 0500 12/12/18 0545 12/13/18 0500 12/15/18 0543  WBC 9.1 10.1 7.5 11.4* 7.7  NEUTROABS  --   --   --   --  5.9  HGB 10.3* 10.3* 9.5* 10.4* 9.3*  HCT 34.2* 33.4* 30.9* 32.4*  30.7*  MCV 94.2 92.3 92.2 88.5 92.2  PLT 250 268 260 306 867    Basic Metabolic Panel: Recent Labs  Lab 12/12/18 0545 12/13/18 0500 12/14/18 0431 12/14/18 1928 12/15/18 0500  NA 139 142 144 146* 147*  K 3.8 3.3* 3.2* 2.9* 3.7  CL 104 101 102 113* 105  CO2 _0 GLUCOSE 286* 171* 164* 151* 97  BUN 32* 28* 29* 28* 41*  CREATININE 0.93 0.82 0.80 0.69 0.95  CALCIUM 8.1* 8.2* 8.0* 6.3* 8.3*  MG  --  1.8 2.0  --  2.2  PHOS  --   --   --   --  5.0*   GFR: Estimated Creatinine Clearance: 99.8 mL/min (by C-G formula based on SCr of 0.95 mg/dL). Recent Labs  Lab 12/11/18 0500 12/12/18 0545 12/13/18 0500 12/15/18 0543  WBC 10.1 7.5 11.4* 7.7    Liver Function Tests: Recent Labs  Lab 12/12/18 0545  AST 22  ALT 22  ALKPHOS 82  BILITOT 0.6  PROT 6.2*  ALBUMIN 1.5*   No results for input(s): LIPASE, AMYLASE in the last 168 hours. No results for input(s): AMMONIA in the last 168 hours.  ABG    Component Value Date/Time   PHART 7.282 (L) 12/07/2018 0349   PCO2ART 50.2 (H) 12/07/2018 0349   PO2ART 59.0 (L)  12/07/2018 0349   HCO3 23.4 12/07/2018 0349   TCO2 25 12/07/2018 0349   ACIDBASEDEF 3.0 (H) 12/07/2018 0349   O2SAT 84.0 12/07/2018 0349     Coagulation Profile: Recent Labs  Lab 12/10/18 1242  INR 1.17    Cardiac Enzymes: No results for input(s): CKTOTAL, CKMB, CKMBINDEX, TROPONINI in the last 168 hours.  HbA1C: Hgb A1c MFr Bld  Date/Time Value Ref Range Status  03/25/2017 05:59 AM 12.3 (H) 4.8 - 5.6 % Final    Comment:    (NOTE)         Pre-diabetes: 5.7 - 6.4         Diabetes: >6.4         Glycemic control for adults with diabetes: <7.0   12/28/2015 08:50 PM 11.7 (H) 4.8 - 5.6 % Final    Comment:    (NOTE)         Pre-diabetes: 5.7 - 6.4         Diabetes: >6.4         Glycemic control for adults with diabetes: <7.0     CBG: Recent Labs  Lab 12/15/18 1137 12/15/18 1545 12/15/18 1949 12/15/18 2328 12/16/18 0401  GLUCAP 91 89  142* 255* 193*     Critical care time: 30 mins     Georgann Housekeeper, AGACNP-BC [place] Pager [phone] or [phone]  12/16/2018 8:20 AM

## 2018-12-16 NOTE — Progress Notes (Signed)
Nutrition Follow-up  DOCUMENTATION CODES:   Not applicable  INTERVENTION:   Continue:  Vital 1.5 @ 55 ml/hr (1320 ml/day) via Cortrak tube 60 ml Prostat BID  Provides: 2380 kcal, 149 grams protein, and 1003 ml free water.   NUTRITION DIAGNOSIS:   Inadequate oral intake related to inability to eat as evidenced by NPO status. Ongoing  GOAL:   Provide needs based on ASPEN/SCCM guidelines Met.   MONITOR:   Vent status, Labs, Skin, Weight trends, I & O's  REASON FOR ASSESSMENT:   Consult (Review TF for fluid )  ASSESSMENT:   63 y/o M who presented to [facility] 1/3 with fever, cough, chest pain & SOB. Pt found to be Influenza A positive. Developed progressive respiratory distress requiring intubation in the ER.    1/15 Cortrak placed (tip post-pyloric) 1/16 trach  Pt discussed during ICU rounds and with RN.  Per RN pt vomited overnight, KUB negative, plan to restart TF today. Spoke with critical care NP about free water. TF provides 1003 ml of free water. Pt has free water flushes ordered 250 ml every 4 hours providing 1500 ml of free water - NP to discontinue free water and continue TF Pt did not tolerate wean, on full support per RN.  Cardiology following, plan for cath once stable  Patient is currently intubated on ventilator support MV: 13 L/min Temp (24hrs), Avg:99.8 F (37.7 C), Min:98.6 F (37 C), Max:101.5 F (38.6 C)  Labs and medications reviewed.  SSI, MVI, folic acid, levemir 45 units BID, thiamine, lasix 80 mg every 12 hours, miralax daily, senokot, 40 mEq KCl BID  250 ml free water every 4 hours - 1500 ml  CBG: 102-163  MAP: 90   I/O: +14,252 ml since admit UOP: 3935 ml x 24 hrs Pt is positive however weight is trending down.   Diet Order:   Diet Order            Diet NPO time specified  Diet effective midnight              EDUCATION NEEDS:   Not appropriate for education at this time  Skin:  Skin Assessment: Skin Integrity Issues: Skin  Integrity Issues:: Stage II Stage II: sacrum  Last BM:  1/20 large  Height:   Ht Readings from Last 1 Encounters:  11/29/18 6' (1.829 m)    Weight:   Wt Readings from Last 1 Encounters:  12/16/18 96.9 kg    Ideal Body Weight:  76.1 kg(adjusted for L BKA)  Estimated Nutritional Needs:   Kcal:  2385  Protein:  120-150 grams  Fluid:  > 2 L/day  Maylon Peppers RD, LDN, CNSC [phone] Pager [phone] After Hours Pager

## 2018-12-16 NOTE — Progress Notes (Addendum)
Progress Note  Patient Name: Antonio [name]. Date of Encounter: 12/16/2018  Primary Cardiologist: Minus Breeding, MD   Subjective   No events overnight. RN present at bedside.   Inpatient Medications    Scheduled Meds: . fentaNYL      . midazolam      . acetylcysteine  4 mL Nebulization BID  . aspirin  81 mg Per Tube Daily  . atorvastatin  40 mg Oral q1800  . bethanechol  10 mg Per Tube QID  . chlorhexidine gluconate (MEDLINE KIT)  15 mL Mouth Rinse BID  . Chlorhexidine Gluconate Cloth  6 each Topical Daily  . clonazePAM  1 mg Per Tube BID  . collagenase   Topical Daily  . enoxaparin (LOVENOX) injection  40 mg Subcutaneous Daily  . famotidine  20 mg Per Tube BID  . feeding supplement (PRO-STAT SUGAR FREE 64)  60 mL Per Tube BID  . fentaNYL  75 mcg Transdermal Q72H  . folic acid  1 mg Per Tube Daily  . free water  250 mL Per Tube Q4H  . furosemide  80 mg Intravenous Q12H  . Gerhardt's butt cream   Topical BID  . insulin aspart  0-20 Units Subcutaneous Q4H  . insulin detemir  45 Units Subcutaneous BID  . ipratropium-albuterol  3 mL Nebulization TID  . mouth rinse  15 mL Mouth Rinse 10 times per day  . multivitamin  15 mL Oral Daily  . polyethylene glycol  17 g Per Tube Daily  . potassium chloride  40 mEq Oral BID  . QUEtiapine  200 mg Per Tube BID  . sennosides  15 mL Per Tube BID  . sodium chloride flush  10-40 mL Intracatheter Q12H  . thiamine  100 mg Per Tube Daily   Continuous Infusions: . dexmedetomidine 1.1 mcg/kg/hr (12/16/18 0900)  . dextrose 20 mL/hr at 12/16/18 0900  . erythromycin Stopped (12/16/18 2025)  . feeding supplement (VITAL 1.5 CAL) Stopped (12/16/18 0235)   PRN Meds: acetaminophen (TYLENOL) oral liquid 160 mg/5 mL, albuterol, albuterol, bisacodyl, docusate, fentaNYL (SUBLIMAZE) injection, ibuprofen, LORazepam, sodium chloride flush   Vital Signs    Vitals:   12/16/18 0742 12/16/18 0800 12/16/18 0900 12/16/18 0935  BP:  100/65      Pulse:  77 (!) 104   Resp:   (!) 23   Temp:  98.6 F (37 C)    TempSrc:  Oral    SpO2: 95% 92% (!) 82% 97%  Weight:      Height:        Intake/Output Summary (Last 24 hours) at 12/16/2018 1010 Last data filed at 12/16/2018 0900 Gross per 24 hour  Intake 2187.82 ml  Output 3935 ml  Net -1747.18 ml   Last 3 Weights 12/16/2018 12/15/2018 12/14/2018  Weight (lbs) 213 lb 10 oz 220 lb 10.9 oz 217 lb 13 oz  Weight (kg) 96.9 kg 100.1 kg 98.8 kg      Telemetry    NSR - Personally Reviewed  ECG    Not performed today- Personally Reviewed  Physical Exam   GEN: middle aged WM w/ trach in no acute distress.   Neck: No JVD Cardiac: RRR Respiratory: bilateral rhonchi anteriorly   GI: Soft, nontender, non-distended  MS: No edema; right BKA Neuro:  Nonfocal  Psych: Normal affect   Labs    Chemistry Recent Labs  Lab 12/12/18 0545  12/14/18 0431 12/14/18 1928 12/15/18 0500  NA 139   < > 144 146*  147*  K 3.8   < > 3.2* 2.9* 3.7  CL 104   < > 102 113* 105  CO2 28   < > '30 25 30  ' GLUCOSE 286*   < > 164* 151* 97  BUN 32*   < > 29* 28* 41*  CREATININE 0.93   < > 0.80 0.69 0.95  CALCIUM 8.1*   < > 8.0* 6.3* 8.3*  PROT 6.2*  --   --   --   --   ALBUMIN 1.5*  --   --   --   --   AST 22  --   --   --   --   ALT 22  --   --   --   --   ALKPHOS 82  --   --   --   --   BILITOT 0.6  --   --   --   --   GFRNONAA >60   < > >60 >60 >60  GFRAA >60   < > >60 >60 >60  ANIONGAP 7   < > '12 8 12   ' < > = values in this interval not displayed.     Hematology Recent Labs  Lab 12/12/18 0545 12/13/18 0500 12/15/18 0543  WBC 7.5 11.4* 7.7  RBC 3.35* 3.66* 3.33*  HGB 9.5* 10.4* 9.3*  HCT 30.9* 32.4* 30.7*  MCV 92.2 88.5 92.2  MCH 28.4 28.4 27.9  MCHC 30.7 32.1 30.3  RDW 13.7 13.0 13.3  PLT 260 306 329    Cardiac EnzymesNo results for input(s): TROPONINI in the last 168 hours. No results for input(s): TROPIPOC in the last 168 hours.   BNPNo results for input(s): BNP, PROBNP in  the last 168 hours.   DDimer No results for input(s): DDIMER in the last 168 hours.   Radiology    Dg Chest Port 1 View  Result Date: 12/16/2018 CLINICAL DATA:  Tracheostomy.  Fever. EXAM: PORTABLE CHEST 1 VIEW COMPARISON:  12/15/2017. FINDINGS: Tracheostomy tube, feeding tube, right PICC line in stable position. Heart size stable. Stable cardiomegaly. Persistent bibasilar atelectasis and infiltrates with slight improvement in aeration from prior exam. Tiny bilateral pleural effusions could not be excluded. No pneumothorax. IMPRESSION: 1. Tracheostomy tube, feeding tube, right PICC line in stable position. 2. Persistent bibasilar atelectasis and infiltrates with slight improvement in aeration from prior exam. Electronically Signed   By: Marcello Moores  Register   On: 12/16/2018 07:15   Dg Chest Port 1 View  Result Date: 12/15/2018 CLINICAL DATA:  Respiratory failure.  Pneumonia. EXAM: PORTABLE CHEST 1 VIEW COMPARISON:  Three days ago FINDINGS: Tracheostomy tube remains well seated. Feeding tube that at least reaches the stomach. Right upper extremity PICC with tip at the upper cavoatrial junction. Haziness of the lower chest that is unchanged. No Kerley lines, definite effusion, or pneumothorax. Mild cardiomegaly. IMPRESSION: Stable and unremarkable hardware positioning. History of pneumonia with unchanged opacity at the lung bases. Electronically Signed   By: Monte Fantasia M.D.   On: 12/15/2018 06:11    Cardiac Studies   Echo 11/29/2018 Study Conclusions - Left ventricle: The cavity size was normal. Wall thickness was normal. Systolic function was severely reduced. The estimated ejection fraction was in the range of 20% to 25%. There is akinesis of the anteroseptal, anterolateral, and apical myocardium. Doppler parameters are consistent with abnormal left ventricular relaxation (grade 1 diastolic dysfunction). - Pericardium, extracardiac: A small pericardial effusion was identified  circumferential to the heart. The fluid exhibited  a fibrinous appearance.There was no evidence of hemodynamic compromise.  Patient Profile     63 y.o. male with PMH of CAD, ICM with previous baseline of 40%, HTN, HLD and DM II who presented 01/03 with acute respiratory failure due to influenza. EF was 20-25% on echo. He was intubated and treated for cardiogenic shock and respiratory failure>>trach 01/16. Pending left and right heart cath once recovered more.  Assessment & Plan    1. Acute Respiratory Failure: in the setting of influenza, complicated by corynebacterium PNA and pulmonary edema. Respiratory status overall improving. S/p trach 1/16. Vent management per PCCM. Still receiving IV diuretics.    2. Cardiomyopathy/ Acute on Chronic Systolic CHF: previous EF was 40%. New EF now 20-25%. He will need cardiac cath for further w/u to exclude obstructive CAD, but prefer to do once more medically stable. I/Os remain net + 14L. Continue IV diuretics. Strict I/Os and daily monitoring of renal function and electrolytes. Medical therapy limited by soft BP. No  blocker nor ACE/ARBs on board at this time. Will try to initiate meds when BP improves. Tele reviewed. No ventricular ectopy. Continue to monitor.     For questions or updates, please contact [place] Please consult [email or web address] for contact info under        Signed, Lyda Jester, PA-C  12/16/2018, 10:10 AM    History and all data above reviewed.  Patient examined.  I agree with the findings as above.  He is cold but denies chest pain or SOB.  The patient exam reveals COR:RRR  ,  Lungs: decreased breath sound but no wheezing or crackles.   ,  Abd: Positive bowel sounds, no rebound no guarding, Ext No edema  .  All available labs, radiology testing, previous records reviewed. Agree with documented assessment and plan. Acute on chronic systolic failure:  Diuresing.  BP too labile to tolerate afterload reduction/beta blocker.   We will follow for med titration and timing of probable heart cath. Discussed with the patient and his mother.     Jeneen Rinks Marcella Charlson  2:05 PM  12/16/2018

## 2018-12-16 NOTE — Progress Notes (Signed)
Occupational Therapy Treatment Patient Details Name: Antonio [name]. MRN: [id number] DOB: [birth year] Today's Date: 12/16/2018    History of present illness 63 yo admitted with flu A and PNA with acute respiratory failure intubated 1/4, trach 1/16 with course complicated by acute MI, CHF and cardiogenic shock. PMhx: DM, HTN, HLD, CAD, Lt BKA, rt toe amputation   OT comments  Pt progressing towards established OT goals. Pt limited this session by respiratory condition; RT, RN, and pulmonary MD present. Pt performing lateral scoot to recliner for simulated toilet transfers; RR elevated to 60s and SpO2 >92%. Pt requiring rest breaks. Pt able to perform grooming task with Min Guard A for safety. Pt verbalizing frustration with lines, vent, and restraints, and pt is motivated to return to PLOF. Continue to recommend dc to CIR and will continue to follow acutely to faciltiate safe dc.    Follow Up Recommendations  CIR;Supervision/Assistance - 24 hour    Equipment Recommendations  Other (comment)(Defer to next venue)    Recommendations for Other Services Rehab consult;PT consult;Speech consult    Precautions / Restrictions Precautions Precautions: Fall Precaution Comments: cortrak, vent, trach, L BKA Restrictions Weight Bearing Restrictions: No       Mobility Bed Mobility Overal bed mobility: Needs Assistance Bed Mobility: Rolling;Sidelying to Sit Rolling: Mod assist;+2 for physical assistance Sidelying to sit: Mod assist;+2 for physical assistance       General bed mobility comments: pt anxious and grabbing with UEs versus listening to commands, maxA to maintain sidelying for hygiene s/p loose stool, modA for trunk elevation and safety, increased RR into 60s with mobility  Transfers Overall transfer level: Needs assistance Equipment used: 2 person hand held assist(with use of bed pad) Transfers: Sit to/from Stand;Lateral/Scoot Transfers Sit to Stand: Max assist;+2 physical  assistance        Lateral/Scoot Transfers: Mod assist;+2 safety/equipment General transfer comment: completed stand x1, unable to pvt pt to chair due to significant resistance from patient and patients agitation, pt able to use both hands and min/modAx2 at bed pad to lateral scoot over to drop arm chair    Balance Overall balance assessment: Needs assistance Sitting-balance support: No upper extremity supported;Feet supported Sitting balance-Leahy Scale: Fair Sitting balance - Comments: when pt supports self with hands pt minA to maintain EOB balance when pt moves UE pt requires UE to maintain EOB blance Postural control: Left lateral lean Standing balance support: Bilateral upper extremity supported Standing balance-Leahy Scale: Zero Standing balance comment: posterior left lean in standing with max 2 person assist                           ADL either performed or assessed with clinical judgement   ADL Overall ADL's : Needs assistance/impaired Eating/Feeding: NPO   Grooming: Min guard;Sitting;Wash/dry face Grooming Details (indicate cue type and reason): Min Guard A for sitting balance at EOB as well as safety with cortrak management.                  Toilet Transfer: Moderate assistance;+2 for physical assistance(lateral scoot to drop arm recliner) Toilet Transfer Details (indicate cue type and reason): Mod A +2 for facilitating hip movements to right with use of bed pad. Pt able to push with RLE and BUEs.  Toileting- Clothing Manipulation and Hygiene: Total assistance;Bed level Toileting - Clothing Manipulation Details (indicate cue type and reason): Total A for peri care after small incontience in bed.  Functional mobility during ADLs: Moderate assistance;+2 for physical assistance(lateral scoot to recliner) General ADL Comments: Pt requiring Total A for ADLs and bed mobiltiy due to poor congition, agitation, and decreased balance.      Vision        Perception     Praxis      Cognition Arousal/Alertness: Awake/alert Behavior During Therapy: Restless;Impulsive Overall Cognitive Status: Impaired/Different from baseline Area of Impairment: Safety/judgement                         Safety/Judgement: Decreased awareness of safety     General Comments: pt frustrated with amount of lines and vent, pt impulsive with movement to the chair        Exercises Exercises: (attempted seated LEs HEP however pt with inc RR into 60s)   Shoulder Instructions       General Comments pt with breakdown on bottom, with dressing on it, pt dependent for hygiene s/p BM. RN and NT present during transfer for vent management. Pulmonary MD coming in at end of transfer to check vent. Discussed mobility plan.    Pertinent Vitals/ Pain       Pain Assessment: Faces Faces Pain Scale: Hurts even more Pain Location: Grimacing with movement Pain Descriptors / Indicators: Grimacing Pain Intervention(s): Monitored during session;Limited activity within patient's tolerance;Repositioned  Home Living                                          Prior Functioning/Environment              Frequency  Min 2X/week        Progress Toward Goals  OT Goals(current goals can now be found in the care plan section)  Progress towards OT goals: Progressing toward goals  Acute Rehab OT Goals Patient Stated Goal: "get rid of this shit" OT Goal Formulation: Patient unable to participate in goal setting Time For Goal Achievement: 12/27/18 Potential to Achieve Goals: Good ADL Goals Pt Will Perform Grooming: with min guard assist;sitting Pt Will Perform Upper Body Dressing: with min guard assist;sitting Pt Will Transfer to Toilet: with min assist;with transfer board;bedside commode Additional ADL Goal #1: Pt will perform bed mobility with Min A in preparation for ADLs Additional ADL Goal #2: Pt will sit at EOB for ~5 minutes with Min  Guard A in preparation for ADLs Additional ADL Goal #3: Pt will follow 50% on cues in quiet environement during ADL  Plan Discharge plan remains appropriate    Co-evaluation    PT/OT/SLP Co-Evaluation/Treatment: Yes Reason for Co-Treatment: Complexity of the patient's impairments (multi-system involvement);For patient/therapist safety;To address functional/ADL transfers PT goals addressed during session: Mobility/safety with mobility OT goals addressed during session: ADL's and self-care      AM-PAC OT "6 Clicks" Daily Activity     Outcome Measure   Help from another person eating meals?: Total Help from another person taking care of personal grooming?: A Little Help from another person toileting, which includes using toliet, bedpan, or urinal?: A Lot Help from another person bathing (including washing, rinsing, drying)?: Total Help from another person to put on and taking off regular upper body clothing?: Total Help from another person to put on and taking off regular lower body clothing?: Total 6 Click Score: 9    End of Session Equipment Utilized During Treatment: Gait belt;Other (comment);Oxygen(vent)  OT  Visit Diagnosis: Unsteadiness on feet (R26.81);Other abnormalities of gait and mobility (R26.89);Muscle weakness (generalized) (M62.81);Other symptoms and signs involving cognitive function   Activity Tolerance Patient tolerated treatment well;Other (comment)(Pt frustrated with lines and vent)   Patient Left with call bell/phone within reach;with nursing/sitter in room;with restraints reapplied;in chair;with chair alarm set(RT present)   Nurse Communication Mobility status        Time: 8675-4492 OT Time Calculation (min): 49 min  Charges: OT General Charges $OT Visit: 1 Visit OT Treatments $Self Care/Home Management : 8-22 mins  Ilo Beamon MSOT, OTR/L Acute Rehab Pager: [phone] Office: [phone]   Theodoro Grist Chanceler Pullin 12/16/2018, 9:54 AM

## 2018-12-17 ENCOUNTER — Inpatient Hospital Stay (HOSPITAL_COMMUNITY): Payer: Medicaid Other

## 2018-12-17 LAB — GLUCOSE, CAPILLARY
Glucose-Capillary: 136 mg/dL — ABNORMAL HIGH (ref 70–99)
Glucose-Capillary: 147 mg/dL — ABNORMAL HIGH (ref 70–99)
Glucose-Capillary: 161 mg/dL — ABNORMAL HIGH (ref 70–99)
Glucose-Capillary: 197 mg/dL — ABNORMAL HIGH (ref 70–99)
Glucose-Capillary: 199 mg/dL — ABNORMAL HIGH (ref 70–99)
Glucose-Capillary: 94 mg/dL (ref 70–99)

## 2018-12-17 LAB — MAGNESIUM: Magnesium: 2.4 mg/dL (ref 1.7–2.4)

## 2018-12-17 LAB — CBC WITH DIFFERENTIAL/PLATELET
Band Neutrophils: 0 %
Basophils Absolute: 0 10*3/uL (ref 0.0–0.1)
Basophils Relative: 0 %
Blasts: 0 %
Eosinophils Absolute: 0.1 10*3/uL (ref 0.0–0.5)
Eosinophils Relative: 1 %
HCT: 33.1 % — ABNORMAL LOW (ref 39.0–52.0)
Hemoglobin: 9.9 g/dL — ABNORMAL LOW (ref 13.0–17.0)
Lymphocytes Relative: 12 %
Lymphs Abs: 1.3 10*3/uL (ref 0.7–4.0)
MCH: 28 pg (ref 26.0–34.0)
MCHC: 29.9 g/dL — AB (ref 30.0–36.0)
MCV: 93.5 fL (ref 80.0–100.0)
MYELOCYTES: 0 %
Metamyelocytes Relative: 0 %
Monocytes Absolute: 0.5 10*3/uL (ref 0.1–1.0)
Monocytes Relative: 5 %
Neutro Abs: 8.6 10*3/uL — ABNORMAL HIGH (ref 1.7–7.7)
Neutrophils Relative %: 82 %
Other: 0 %
PROMYELOCYTES RELATIVE: 0 %
Platelets: 388 10*3/uL (ref 150–400)
RBC: 3.54 MIL/uL — ABNORMAL LOW (ref 4.22–5.81)
RDW: 13.6 % (ref 11.5–15.5)
WBC: 10.5 10*3/uL (ref 4.0–10.5)
nRBC: 0 % (ref 0.0–0.2)
nRBC: 0 /100 WBC

## 2018-12-17 LAB — BRAIN NATRIURETIC PEPTIDE: B Natriuretic Peptide: 48.7 pg/mL (ref 0.0–100.0)

## 2018-12-17 LAB — BASIC METABOLIC PANEL
Anion gap: 11 (ref 5–15)
BUN: 40 mg/dL — ABNORMAL HIGH (ref 6–20)
CHLORIDE: 106 mmol/L (ref 98–111)
CO2: 29 mmol/L (ref 22–32)
CREATININE: 1.25 mg/dL — AB (ref 0.61–1.24)
Calcium: 8.7 mg/dL — ABNORMAL LOW (ref 8.9–10.3)
GFR calc Af Amer: >60 mL/min (ref >60)
GFR calc non Af Amer: >60 mL/min (ref >60)
Glucose, Bld: 220 mg/dL — ABNORMAL HIGH (ref 70–99)
POTASSIUM: 3.9 mmol/L (ref 3.5–5.1)
Sodium: 146 mmol/L — ABNORMAL HIGH (ref 135–145)

## 2018-12-17 LAB — PHOSPHORUS: Phosphorus: 5 mg/dL — ABNORMAL HIGH (ref 2.5–4.6)

## 2018-12-17 MED ORDER — ALTEPLASE 2 MG IJ SOLR
2.0000 mg | Freq: Once | INTRAMUSCULAR | Status: AC
  Administered 2018-12-17: 2 mg

## 2018-12-17 MED ORDER — CLONIDINE HCL 0.1 MG PO TABS: 0.1000 mg | ORAL_TABLET | Freq: Every day | ORAL | Status: DC

## 2018-12-17 MED ORDER — POLYETHYLENE GLYCOL 3350 17 G PO PACK
17.0000 g | PACK | Freq: Every day | ORAL | Status: DC | PRN
  Filled 2018-12-17: qty 1

## 2018-12-17 MED ORDER — CLONIDINE HCL 0.1 MG PO TABS
0.1000 mg | ORAL_TABLET | Freq: Four times a day (QID) | ORAL | Status: AC
  Administered 2018-12-17 – 2018-12-18 (×4): 0.1 mg
  Filled 2018-12-17 (×4): qty 1

## 2018-12-17 MED ORDER — CLONIDINE HCL 0.1 MG PO TABS
0.1000 mg | ORAL_TABLET | Freq: Two times a day (BID) | ORAL | Status: AC
  Administered 2018-12-18 – 2018-12-19 (×2): 0.1 mg
  Filled 2018-12-17 (×2): qty 1

## 2018-12-17 MED ORDER — POTASSIUM CHLORIDE 20 MEQ PO PACK
40.0000 meq | PACK | Freq: Once | ORAL | Status: AC
  Administered 2018-12-17: 40 meq via ORAL

## 2018-12-17 MED ORDER — SODIUM CHLORIDE 0.9 % IV SOLN
INTRAVENOUS | Status: DC | PRN
  Administered 2018-12-17 – 2018-12-24 (×3): 500 mL via INTRAVENOUS
  Administered 2019-01-14 – 2019-01-16 (×2): 250 mL via INTRAVENOUS
  Administered 2019-01-19: 04:00:00 via INTRAVENOUS
  Administered 2019-01-23: 500 mL via INTRAVENOUS

## 2018-12-17 NOTE — Progress Notes (Addendum)
Progress Note  Patient Name: Antonio [name]. Date of Encounter: 12/17/2018  Primary Cardiologist: Minus Breeding, MD   Subjective   On vent. Per RN, no events overnight.   Inpatient Medications    Scheduled Meds: . acetylcysteine  4 mL Nebulization BID  . aspirin  81 mg Per Tube Daily  . atorvastatin  40 mg Oral q1800  . bethanechol  10 mg Per Tube QID  . chlorhexidine gluconate (MEDLINE KIT)  15 mL Mouth Rinse BID  . Chlorhexidine Gluconate Cloth  6 each Topical Daily  . clonazePAM  1 mg Per Tube BID  . enoxaparin (LOVENOX) injection  40 mg Subcutaneous Daily  . famotidine  20 mg Per Tube BID  . feeding supplement (PRO-STAT SUGAR FREE 64)  60 mL Per Tube BID  . fentaNYL  75 mcg Transdermal Q72H  . folic acid  1 mg Per Tube Daily  . furosemide  80 mg Intravenous Q12H  . Gerhardt's butt cream   Topical BID  . insulin aspart  0-20 Units Subcutaneous Q4H  . insulin detemir  45 Units Subcutaneous BID  . ipratropium-albuterol  3 mL Nebulization TID  . mouth rinse  15 mL Mouth Rinse 10 times per day  . multivitamin  15 mL Oral Daily  . polyethylene glycol  17 g Per Tube Daily  . potassium chloride  40 mEq Oral BID  . QUEtiapine  200 mg Per Tube BID  . sennosides  15 mL Per Tube BID  . sodium chloride flush  10-40 mL Intracatheter Q12H  . thiamine  100 mg Per Tube Daily   Continuous Infusions: . cefTAZidime (FORTAZ)  IV Stopped (12/17/18 0154)  . dexmedetomidine 1.1 mcg/kg/hr (12/17/18 0700)  . feeding supplement (VITAL 1.5 CAL) Stopped (12/16/18 0235)  . vancomycin 250 mL/hr at 12/17/18 0700   PRN Meds: acetaminophen (TYLENOL) oral liquid 160 mg/5 mL, albuterol, albuterol, bisacodyl, docusate, fentaNYL (SUBLIMAZE) injection, ibuprofen, LORazepam, sodium chloride flush   Vital Signs    Vitals:   12/17/18 0600 12/17/18 0700 12/17/18 0758 12/17/18 0800  BP: (!) 108/59 97/74 (!) 96/53   Pulse: 87 75 70   Resp: (!) 22 20 (!) 22   Temp:      TempSrc:      SpO2:  93% 95% 93% 94%  Weight:      Height:        Intake/Output Summary (Last 24 hours) at 12/17/2018 0846 Last data filed at 12/17/2018 0700 Gross per 24 hour  Intake 1943.73 ml  Output 2525 ml  Net -581.27 ml   Last 3 Weights 12/17/2018 12/16/2018 12/15/2018  Weight (lbs) 208 lb 12.4 oz 213 lb 10 oz 220 lb 10.9 oz  Weight (kg) 94.7 kg 96.9 kg 100.1 kg      Telemetry    NSR 70s currently - Personally Reviewed  ECG     not performed today- Personally Reviewed  Physical Exam   GEN: middle aged WM, w/ trach and on vent. No acute distress.   Neck: No JVD Cardiac: RRR, no murmurs, rubs, or gallops.  Respiratory: on vent GI: Soft, nontender, non-distended  MS: s/p left BKA, also RLE digital amputation, no edema  Neuro:  Nonfocal  Psych: Normal affect   Labs    Chemistry Recent Labs  Lab 12/12/18 0545  12/15/18 0500 12/16/18 0856 12/17/18 0506  NA 139   < > 147* 143 146*  K 3.8   < > 3.7 5.0 3.9  CL 104   < >  105 106 106  CO2 28   < > _0 GLUCOSE 286*   < > 97 336* 220*  BUN 32*   < > 41* 36* 40*  CREATININE 0.93   < > 0.95 1.09 1.25*  CALCIUM 8.1*   < > 8.3* 8.0* 8.7*  PROT 6.2*  --   --   --   --   ALBUMIN 1.5*  --   --   --   --   AST 22  --   --   --   --   ALT 22  --   --   --   --   ALKPHOS 82  --   --   --   --   BILITOT 0.6  --   --   --   --   GFRNONAA >60   < > >60 >60 >60  GFRAA >60   < > >60 >60 >60  ANIONGAP 7   < > _1 < > = values in this interval not displayed.     Hematology Recent Labs  Lab 12/15/18 0543 12/16/18 0856 12/17/18 0506  WBC 7.7 9.0 10.5  RBC 3.33* 3.34* 3.54*  HGB 9.3* 9.4* 9.9*  HCT 30.7* 31.3* 33.1*  MCV 92.2 93.7 93.5  MCH 27.9 28.1 28.0  MCHC 30.3 30.0 29.9*  RDW 13.3 13.7 13.6  PLT 329 353 388    Cardiac EnzymesNo results for input(s): TROPONINI in the last 168 hours. No results for input(s): TROPIPOC in the last 168 hours.   BNPNo results for input(s): BNP, PROBNP in the last 168 hours.   DDimer No  results for input(s): DDIMER in the last 168 hours.   Radiology    Dg Chest Port 1 View  Result Date: 12/17/2018 CLINICAL DATA:  Hypoxia EXAM: PORTABLE CHEST 1 VIEW COMPARISON:  12/16/2018 FINDINGS: Right-sided PICC line, feeding catheter and tracheostomy tube are noted in place. Cardiac shadow remains enlarged but stable. Bibasilar atelectatic changes and small effusions are again identified. No new focal abnormality is seen. IMPRESSION: No significant change from the prior exam. Electronically Signed   By: Inez Catalina M.D.   On: 12/17/2018 07:45   Dg Chest Port 1 View  Result Date: 12/16/2018 CLINICAL DATA:  Tracheostomy.  Fever. EXAM: PORTABLE CHEST 1 VIEW COMPARISON:  12/15/2017. FINDINGS: Tracheostomy tube, feeding tube, right PICC line in stable position. Heart size stable. Stable cardiomegaly. Persistent bibasilar atelectasis and infiltrates with slight improvement in aeration from prior exam. Tiny bilateral pleural effusions could not be excluded. No pneumothorax. IMPRESSION: 1. Tracheostomy tube, feeding tube, right PICC line in stable position. 2. Persistent bibasilar atelectasis and infiltrates with slight improvement in aeration from prior exam. Electronically Signed   By: Marcello Moores  Register   On: 12/16/2018 07:15   Dg Abd Portable 1v  Result Date: 12/16/2018 CLINICAL DATA:  Vomiting EXAM: PORTABLE ABDOMEN - 1 VIEW COMPARISON:  12/04/2018 FINDINGS: Feeding catheter is noted within the distal stomach. Scattered large and small bowel gas is noted without obstructive change. No free air is seen. No acute bony abnormality is noted. IMPRESSION: No acute abnormality noted. Electronically Signed   By: Inez Catalina M.D.   On: 12/16/2018 12:26    Cardiac Studies   Echo 11/29/2018 Study Conclusions - Left ventricle: The cavity size was normal. Wall thickness was normal. Systolic function was severely reduced. The estimated ejection fraction was in the range of 20% to 25%. There  is akinesis of the  anteroseptal, anterolateral, and apical myocardium. Doppler parameters are consistent with abnormal left ventricular relaxation (grade 1 diastolic dysfunction). - Pericardium, extracardiac: A small pericardial effusion was identified circumferential to the heart. The fluid exhibited a fibrinous appearance.There was no evidence of hemodynamic compromise.  Patient Profile     63 y.o.malewith PMH of CAD, ICM with previous baseline of 40%, HTN, HLD and DM II who presented01/03with acute respiratory failure due to influenza. EF was 20-25% on echo. He was intubated and treated for cardiogenic shock and respiratory failure>>trach 01/16. Pending left and right heartcathonce more medically stable.   Assessment & Plan   1. Acute Respiratory Failure: in the setting of influenza, complicated by corynebacterium PNA and pulmonary edema. S/p trach 1/16 w/ revision 1/21. Vent management per PCCM. Has been treated w/ IV diuretics. Scheduled to get 80 Lasix BID but no gross peripheral edema on exam today.  2. Cardiomyopathy/ Acute on Chronic Systolic CHF: previous EF was 40%. New EF now 20-25%. He will need cardiac cath for further w/u to exclude obstructive CAD, but prefer to do once more medically stable. I/Os remain net + 14L but no appreciable peripheral edema on exam. He has had good UOP w/ diuretics but fluid intake has been high. His Scr is also trending up. Will check a BNP today to help guide further diuresis. Will hold IV Lasix today, for now, given AKI. May resume IV diuretics tomorrow pending BNP level and renal function. Continue strict I/Os and daily monitoring of renal function and electrolytes. Medical therapy limited by soft BP. SBPs in the upper 90s this morning. Subsequently, No ? blocker nor ACE/ARBs on board at this time. Will try to initiate meds when BP improves. Tele reviewed. No ventricular ectopy. Continue to monitor.   3. AKI: bump in SCr overnight  from 1.09>>1.25. BUN up from 36>>40. Also started on vancomycin yesterday. Given he does not appear to be grossly volume overloaded today, will hold IV diuretics for now. Check BNP as outlined above. Possibly resume diuretics tomorrow, pending renal function and HF markers. Continue to monitor closely. F/u BMP in the AM.   4. Pulmonary: pt required tracheostomy revision 12/16/18. Despite trach change, he continued to remain hypoxic requiring 80% FiO2 to keep O2 sats in low 90s yesterday. Also noted to have thick airway secretions concerning for infection. He was placed on broad spectrum Abx. Further w/u and management per PCCM.    For questions or updates, please contact [facility] Please consult [email or web address] for contact info under    Signed, Lyda Jester, PA-C  12/17/2018, 8:46 AM    History and all data above reviewed.  Patient examined.  I agree with the findings as above.   He denies chest pain or SOB. The patient exam reveals COR:RRR  ,  Lungs: Clear  ,  Abd: Positive bowel sounds, no rebound no guarding, Ext No edema  .  All available labs, radiology testing, previous records reviewed. Agree with documented assessment and plan. Agree with holding diuretics today.  We will follow as needed.  Unable to titrate meds secondary to low BPs.   Jeneen Rinks Letica Giaimo  11:15 AM  12/17/2018

## 2018-12-17 NOTE — Progress Notes (Addendum)
NAME:  Antonio Bruington., MRN:  [id number], DOB:  1956-05-23, LOS: 6 ADMISSION DATE:  11/28/2018, CONSULTATION DATE: January 3 REFERRING MD: EDP, CHIEF COMPLAINT: Dyspnea  Brief History   63 year old male with influenza A causing respiratory failure and cardiogenic shock.  Prolonged mechanical ventilation, tracheostomy placed on January 16.  Past Medical History  CAD, DM2, HTN, HLD  Significant Hospital Events   1/6 self extubated 1/7 re-intubated 1/8 self extubated 1/9 acute respiratory failure, mucus plugging right lower lobe, re-intubated, bronchoscopy 1/10 New fever T Max 103.1, antibiotics resumed with ceftaz and vancomycin 1/11: Hemodynamically seems to be improving.  Very hypernatremic.  X-ray improving.  Adding free water, one-time Lasix, follow-up chemistry a.m.  Hoping to initiate weaning efforts on 1/12 1/16.  Continuing diuresis and ongoing ventilatory support.  Working on weaning but still has significant work of breathing.  Plan has been to proceed with tracheostomy.  Hopefully we can continue ongoing diuresis and continue to decrease sedation 1/20 still struggling to wean. Secretions and anxiety are barriers.  1/21 trach revision to 6 distal XLT  Consults:  PCCM Cardiology   Procedures:  ETT 1/3 >>1/6, 1/7 > 1/8> 1/9 >> 1/16 1/5 Right TL PICC >> 1/9 Bronch>> Mucus plugging RLL Trach 1/16  (JY) >> 1/20 trach change failed and #4 cuffed 1/21 trach upsized to 6 cuffed XLT distal  1/17 right nare cortrak >>  Significant Diagnostic Tests:  UDS 1/4 >>negative  ECHO 1/4 >>LVEF 20-25%, akinesis of the anteroseptal, anterolateral & apical myocardium, LVEF 11-57%, grade 1 diastolic dysfunction, small pericardial effusion without evidence of hemodynamic compromise CT Chest / ABD / Pelvis 1/3 >> airway thickening, motion artifact, CAD CXR 1/14> bilateral opacities, associated small pleural effusion   Micro Data:  RVP 1/3 >> positive for influenza A BCx2 1/3 >>No  growth UC 1/4 >>No Growth Sputum Cx 1/7 >> Normal respiratory flora BAL >> corynebacterium  Blood Cx 1/10 > neg  Sputum 1/21 >  Antimicrobials:  Vanco 1/3 x1;   1/21 >>  Zosyn 1/30 x1  Tamiflu 1/3 >1/8 1/10 vancomycin>1/12 1/10 ceftaz >1/12;   1/21 >> 1/12 emycin > 1/22  Interim history/subjective:  No events overnight, no further issues with trach Required FiO2 -70% overnight tmax 101.8 overnight Ongoing thick secretions Remains on precedex 1.1, still in restraints due to reaching for tubes/lines Neg -661/ net + 14L, weight down with good UOP, however sCr up  Patient mouths words- asking for water  Objective   Blood pressure (!) 96/53, pulse 70, temperature 99.3 F (37.4 C), temperature source Oral, resp. rate (!) 22, height 6' (1.829 m), weight 94.7 kg, SpO2 94 %. CVP:  [8 mmHg-9 mmHg] 8 mmHg  Vent Mode: PRVC FiO2 (%):  [60 %-100 %] 70 % Set Rate:  [22 bmp] 22 bmp Vt Set:  [620 mL] 620 mL PEEP:  [5 cmH20] 5 cmH20 Plateau Pressure:  [19 cmH20-26 cmH20] 19 cmH20   Intake/Output Summary (Last 24 hours) at 12/17/2018 0841 Last data filed at 12/17/2018 0700 Gross per 24 hour  Intake 1943.73 ml  Output 2525 ml  Net -581.27 ml   Filed Weights   12/15/18 0500 12/16/18 0514 12/17/18 0500  Weight: 100.1 kg 96.9 kg 94.7 kg   Examination:  General:  Adult male on MV in NAD HEENT: MM pink/moist, pupils 3/reactive, midline 6 shiley XLT distal, right nare cortrak Neuro: Awake, f/c, MAE- mouths words CV:  rrr, no murmur PULM: even/non-labored, lungs bilaterally clear- thick tan secretions GI: soft, non-tender,  bs active  Extremities: warm/dry, no LE edema, left BKA, partial removal of right foot Skin: no rashes, ongoing sacral decub   Resolved Hospital Problem list   Sedation related hypotension Acute pulmonary edema   Assessment & Plan:  Acute respiratory failure with hypoxemia: Influenza A, healthcare associated pneumonia (corynbacterium in sputum ).  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  Known EF of 20 to 25% with akinesis of the anterior septal anterior lateral and apical myocardium grade 1 diastolic dysfunction P:  Tele monitoring  Cardiology following, appreciate recommendations; will need LHC when stable Hold lasix 80 mg BID, today given increased sCr Daily ASA, lipitor Blood pressure does not allow beta blocker/ ACE/ ARB at this time Elective cardiac cath warranted if he will allow Goal K > 4, Mag > 2, trend BMET   Acute metabolic encephalopathy: improving, toxic metabolic encephalopathy/ICU delirium Continue enteral Seroquel 200 mg BID, klonopin 72m BID, ativan 130mq 8 PRN Wean precedex as able - consider clonidine taper per pharm Monitor QTc  Diabetes mellitus with acute hyperglycemia - hypoglycemic episodes 1/21 2/2 TF held P:  Sliding scale insulin protocol resistant CBG levemir 45 units BID   Constipation - small  P:  Senokot BID  MiraLAX prn   Hypokalemia P:  KCL 40 meq x 1 Trend BMET  Vomiting - no further epidsodes  - KUB 1/21 neg for ileus P:  Monitor, tolerating TF currently   Best practice:  Diet:  TF Pain/Anxiety/Delirium protocol (if indicated): oral anxiolytic agents, wean precedex  VAP protocol (if indicated): yes DVT prophylaxis: lovenox GI prophylaxis: famotidine Glucose control: SSI/ levemir  Mobility: encourage OOB to chair Code Status: full Family Communication: 1/22- son, Josh updated at bedside.  Disposition: remain in ICU   Labs   CBC: Recent Labs  Lab 12/12/18 0545 12/13/18 0500 12/15/18 0543 12/16/18 0856 12/17/18 0506  WBC 7.5 11.4* 7.7 9.0 10.5  NEUTROABS  --   --  5.9  --  8.6*  HGB 9.5* 10.4* 9.3* 9.4* 9.9*  HCT 30.9* 32.4* 30.7* 31.3* 33.1*  MCV 92.2 88.5 92.2 93.7 93.5  PLT 260 306 329 353 [id number]  Basic Metabolic Panel: Recent Labs  Lab 12/13/18 0500 12/14/18 0431 12/14/18 1928 12/15/18 0500 12/16/18 0856 12/17/18 0506  NA 142 144 146* 147* 143 146*  K 3.3* 3.2* 2.9* 3.7 5.0 3.9  CL 101 102 113* 105 106 106  CO2 _0 GLUCOSE 171* 164* 151* 97 336* 220*  BUN 28* 29* 28* 41* 36* 40*  CREATININE 0.82 0.80 0.69 0.95 1.09 1.25*  CALCIUM 8.2* 8.0* 6.3* 8.3* 8.0* 8.7*  MG 1.8 2.0  --  2.2  --  2.4  PHOS  --   --   --  5.0*  --  5.0*   GFR: Estimated Creatinine Clearance: 75 mL/min (A) (by C-G formula based on SCr of 1.25 mg/dL (H)). Recent Labs  Lab 12/13/18 0500 12/15/18 0543 12/16/18 0856 12/17/18 0506  WBC 11.4* 7.7 9.0 10.5    Liver Function Tests: Recent Labs  Lab 12/12/18 0545  AST 22  ALT 22  ALKPHOS 82  BILITOT 0.6  PROT 6.2*  ALBUMIN 1.5*   No results for input(s): LIPASE, AMYLASE in the last 168 hours. No results for input(s): AMMONIA in the last 168 hours.  ABG    Component Value Date/Time   PHART 7.282 (L) 12/07/2018 0349   PCO2ART 50.2 (H) 12/07/2018 0349   PO2ART 59.0 (L) 12/07/2018 0349   HCO3 23.4 12/07/2018 0349   TCO2 25 12/07/2018 0349   ACIDBASEDEF 3.0 (H) 12/07/2018 0349   O2SAT 84.0 12/07/2018 0349     Coagulation Profile: Recent Labs  Lab 12/10/18 1242  INR 1.17     Cardiac Enzymes: No results for input(s): CKTOTAL, CKMB, CKMBINDEX, TROPONINI in the last 168 hours.  HbA1C: Hgb A1c MFr Bld  Date/Time Value Ref Range Status  03/25/2017 05:59 AM 12.3 (H) 4.8 - 5.6 % Final    Comment:    (NOTE)         Pre-diabetes: 5.7 - 6.4         Diabetes: >6.4         Glycemic control for adults with diabetes: <7.0   12/28/2015 08:50 PM 11.7 (H) 4.8 - 5.6 % Final    Comment:    (NOTE)         Pre-diabetes: 5.7 - 6.4         Diabetes: >6.4         Glycemic control for adults with diabetes: <7.0     CBG: Recent Labs  Lab 12/16/18 1519 12/16/18 2025 12/16/18 2105 12/16/18 2336 12/17/18 0342  GLUCAP 119* 58* 140* 166* 197*     Critical care time: 40 mins     Georgann Housekeeper, AGACNP-BC [place] Pager [phone] or [phone]  12/17/2018 8:41 AM

## 2018-12-17 NOTE — Consult Note (Signed)
WOC Nurse wound consult note Reason for Consult:Right gluteal unstageable pressure injury.  I & D site has healed.  Patient currently in soft wrist restraints that are easily removed for assessment.  Wound has not improved with addition of enzymatic debrider.  Wound type:Unstageable pressure injury.  Pressure Injury POA: Yes/No/NA Measurement: 4 cm x 5 cm 100% thin adherent slough, boggy in center Wound bed:see above Drainage (amount, consistency, odor) minimal serosanguinous   Periwound:intact Dressing procedure/placement/frequency: Will begin hydrotherapy and continue Santyl daily. Turn and reposition every two hours for pressure redistribution and as often as needed for restraint protocol. Is on mattress replacement with low air loss feature.  WOC team will monitor progress weekly.  [name] MSN, RN, FNP-BC [facility]ON Wound, Ostomy, Continence Nurse Pager [phone]

## 2018-12-17 NOTE — Progress Notes (Signed)
Wound care consulted for unstagable wound in sacrum area.  See WOC note.    2 of 3 ports from PICC line not flushing.  IV team consulted.   Pt has had three bowels movement during this shift.  MD notified.  New orders received.  RN will continue to monitor.

## 2018-12-18 ENCOUNTER — Inpatient Hospital Stay (HOSPITAL_COMMUNITY): Payer: Medicaid Other

## 2018-12-18 DIAGNOSIS — R0902 Hypoxemia: Secondary | ICD-10-CM

## 2018-12-18 DIAGNOSIS — J810 Acute pulmonary edema: Secondary | ICD-10-CM

## 2018-12-18 LAB — GLUCOSE, CAPILLARY
Glucose-Capillary: 141 mg/dL — ABNORMAL HIGH (ref 70–99)
Glucose-Capillary: 85 mg/dL (ref 70–99)
Glucose-Capillary: 162 mg/dL — ABNORMAL HIGH (ref 70–99)
Glucose-Capillary: 138 mg/dL — ABNORMAL HIGH (ref 70–99)
Glucose-Capillary: 119 mg/dL — ABNORMAL HIGH (ref 70–99)
Glucose-Capillary: 175 mg/dL — ABNORMAL HIGH (ref 70–99)

## 2018-12-18 LAB — CBC
HCT: 29.9 % — ABNORMAL LOW (ref 39.0–52.0)
Hemoglobin: 9 g/dL — ABNORMAL LOW (ref 13.0–17.0)
MCH: 28.8 pg (ref 26.0–34.0)
MCHC: 30.1 g/dL (ref 30.0–36.0)
MCV: 95.5 fL (ref 80.0–100.0)
Platelets: 362 10*3/uL (ref 150–400)
RBC: 3.13 MIL/uL — ABNORMAL LOW (ref 4.22–5.81)
RDW: 13.7 % (ref 11.5–15.5)
WBC: 8.1 10*3/uL (ref 4.0–10.5)
nRBC: 0 % (ref 0.0–0.2)

## 2018-12-18 LAB — RENAL FUNCTION PANEL
Albumin: 1.4 g/dL — ABNORMAL LOW (ref 3.5–5.0)
Anion gap: 5 (ref 5–15)
BUN: 33 mg/dL — ABNORMAL HIGH (ref 6–20)
CHLORIDE: 118 mmol/L — AB (ref 98–111)
CO2: 27 mmol/L (ref 22–32)
Calcium: 7.9 mg/dL — ABNORMAL LOW (ref 8.9–10.3)
Creatinine, Ser: 0.99 mg/dL (ref 0.61–1.24)
GFR calc Af Amer: >60 mL/min (ref >60)
GFR calc non Af Amer: >60 mL/min (ref >60)
Glucose, Bld: 90 mg/dL (ref 70–99)
Phosphorus: 3.6 mg/dL (ref 2.5–4.6)
Potassium: 3.4 mmol/L — ABNORMAL LOW (ref 3.5–5.1)
Sodium: 150 mmol/L — ABNORMAL HIGH (ref 135–145)

## 2018-12-18 MED ORDER — SODIUM CHLORIDE 0.9 % IV SOLN
1.0000 g | Freq: Three times a day (TID) | INTRAVENOUS | Status: DC
  Administered 2018-12-18 – 2018-12-19 (×4): 1 g via INTRAVENOUS
  Filled 2018-12-18 (×5): qty 1

## 2018-12-18 MED ORDER — ACETYLCYSTEINE 20 % IN SOLN
4.0000 mL | Freq: Two times a day (BID) | RESPIRATORY_TRACT | Status: DC
  Administered 2018-12-18: 4 mL via RESPIRATORY_TRACT
  Filled 2018-12-18: qty 4

## 2018-12-18 MED ORDER — POTASSIUM CHLORIDE 20 MEQ/15ML (10%) PO SOLN
40.0000 meq | Freq: Three times a day (TID) | ORAL | Status: AC
  Administered 2018-12-18 (×2): 40 meq
  Filled 2018-12-18 (×2): qty 30

## 2018-12-18 MED ORDER — FUROSEMIDE 10 MG/ML IJ SOLN
20.0000 mg | Freq: Four times a day (QID) | INTRAMUSCULAR | Status: AC
  Administered 2018-12-18 – 2018-12-19 (×3): 20 mg via INTRAVENOUS
  Filled 2018-12-18 (×3): qty 2

## 2018-12-18 MED ORDER — POTASSIUM CHLORIDE 20 MEQ/15ML (10%) PO SOLN
40.0000 meq | Freq: Once | ORAL | Status: AC
  Administered 2018-12-18: 40 meq
  Filled 2018-12-18: qty 30

## 2018-12-18 MED ORDER — SODIUM CHLORIDE 3 % IN NEBU
4.0000 mL | INHALATION_SOLUTION | Freq: Every day | RESPIRATORY_TRACT | Status: AC
  Administered 2018-12-18 – 2018-12-20 (×2): 4 mL via RESPIRATORY_TRACT
  Filled 2018-12-18 (×5): qty 4

## 2018-12-18 MED ORDER — FREE WATER
300.0000 mL | Freq: Four times a day (QID) | Status: DC
  Administered 2018-12-18 – 2019-01-01 (×50): 300 mL

## 2018-12-18 NOTE — Progress Notes (Addendum)
Physical Therapy Wound Treatment Patient Details  Name: Antonio [name]. MRN: [id number] Date of Birth: 08-20-1956  Today's Date: 12/18/2018 Time: 6945-0388 Time Calculation (min): 46 min  Subjective  Subjective: "I need to go to the bathroom." Patient and Family Stated Goals: none stated Date of Onset: (unknown) Prior Treatments: dressing change  Pain Score: Pt reporting some discomfort with pulsatile lavage; Pre medicated  Wound Assessment  Pressure Injury 12/01/18 Unstageable - Full thickness tissue loss in which the base of the ulcer is covered by slough (yellow, tan, gray, green or brown) and/or eschar (tan, brown or black) in the wound bed. sacrum (Active)  Wound Image   12/18/2018  9:18 AM  Dressing Type Gauze (Comment);Moist to dry;ABD;Other (Comment) 12/18/2018  9:18 AM  Dressing Changed;Clean;Dry;Intact 12/18/2018  9:18 AM  Dressing Change Frequency Daily 12/18/2018  9:18 AM  State of Healing Other (Comment) 12/18/2018  9:18 AM  Site / Wound Assessment Red;Yellow 12/18/2018  9:18 AM  % Wound base Red or Granulating 5% 12/18/2018  9:18 AM  % Wound base Yellow/Fibrinous Exudate 90% 12/18/2018  9:18 AM  % Wound base Black/Eschar 0% 12/18/2018  9:18 AM  % Wound base Other/Granulation Tissue (Comment) 5% 12/18/2018  9:18 AM  Peri-wound Assessment Purple;Excoriated;Erythema (blanchable) 12/18/2018  9:18 AM  Wound Length (cm) 8.5 cm 12/18/2018  9:18 AM  Wound Width (cm) 4 cm 12/18/2018  9:18 AM  Wound Depth (cm) 0.1 cm 12/18/2018  9:18 AM  Wound Surface Area (cm^2) 34 cm^2 12/18/2018  9:18 AM  Wound Volume (cm^3) 3.4 cm^3 12/18/2018  9:18 AM  Tunneling (cm) 2.5cm at 6:oo on clock 12/11/2018  8:00 PM  Margins Unattached edges (unapproximated) 12/18/2018  9:18 AM  Drainage Amount Minimal 12/18/2018  9:18 AM  Drainage Description Serosanguineous 12/18/2018  9:18 AM  Treatment Debridement (Selective);Hydrotherapy (Pulse lavage) 12/18/2018  9:18 AM  Santyl applied to wound bed prior to applying  dressing.  Hydrotherapy Pulsed lavage therapy - wound location: sacrum Pulsed Lavage with Suction (psi): 12 psi(8-12) Pulsed Lavage with Suction - Normal Saline Used: 1000 mL Pulsed Lavage Tip: Tip with splash shield Selective Debridement Selective Debridement - Location: sacrum Selective Debridement - Tools Used: Forceps;Scalpel Selective Debridement - Tissue Removed: removal of yellow nonviable tissue   Wound Assessment and Plan  Wound Therapy - Assess/Plan/Recommendations Wound Therapy - Clinical Statement: Patient presents to hydrotherapy with unstageable sacral wound. Initiate hydrotherapy for removal of nonviable tissue and to decrease bioburden. Wound Therapy - Functional Problem List: decr mobility Factors Delaying/Impairing Wound Healing: Diabetes Mellitus;Immobility;Multiple medical problems Hydrotherapy Plan: Debridement;Dressing change;Patient/family education;Pulsatile lavage with suction Wound Therapy - Frequency: 6X / week Wound Therapy - Follow Up Recommendations: Skilled nursing facility Wound Plan: see above  Wound Therapy Goals- Improve the function of patient's integumentary system by progressing the wound(s) through the phases of wound healing (inflammation - proliferation - remodeling) by: Decrease Necrotic Tissue to: 70 Decrease Necrotic Tissue - Progress: Progressing toward goal Increase Granulation Tissue to: 30 Increase Granulation Tissue - Progress: Progressing toward goal Goals/treatment plan/discharge plan were made with and agreed upon by patient/family: Yes Time For Goal Achievement: 7 days Wound Therapy - Potential for Goals: Good  Goals will be updated until maximal potential achieved or discharge criteria met.  Discharge criteria: when goals achieved, discharge from hospital, MD decision/surgical intervention, no progress towards goals, refusal/missing three consecutive treatments without notification or medical reason.  GP    [name], PT,  DPT Acute Rehabilitation Services Pager [phone] Office [phone]   [place]  Fabiola Backer 12/18/2018, 10:02 AM

## 2018-12-18 NOTE — Progress Notes (Addendum)
NAME:  Antonio Haymer., MRN:  [id number], DOB:  1956/02/14, LOS: 70 ADMISSION DATE:  11/28/2018, CONSULTATION DATE: January 3 REFERRING MD: EDP, CHIEF COMPLAINT: Dyspnea  Brief History   63 year old male with influenza A causing respiratory failure and cardiogenic shock.  Prolonged mechanical ventilation, tracheostomy placed on January 16.  Past Medical History  CAD, DM2, HTN, HLD  Significant Hospital Events   1/6 self extubated 1/7 re-intubated 1/8 self extubated 1/9 acute respiratory failure, mucus plugging right lower lobe, re-intubated, bronchoscopy 1/10 New fever T Max 103.1, antibiotics resumed with ceftaz and vancomycin 1/11: Hemodynamically seems to be improving.  Very hypernatremic.  X-ray improving.  Adding free water, one-time Lasix, follow-up chemistry a.m.  Hoping to initiate weaning efforts on 1/12 1/16.  Continuing diuresis and ongoing ventilatory support.  Working on weaning but still has significant work of breathing.  Plan has been to proceed with tracheostomy.  Hopefully we can continue ongoing diuresis and continue to decrease sedation 1/20 still struggling to wean. Secretions and anxiety are barriers.  1/21 trach revision to 6 distal XLT, fever, increased secretions, abx broadened  1/22 ongoing thick secretions  Consults:  PCCM Cardiology   Procedures:  ETT 1/3 >>1/6, 1/7 > 1/8> 1/9 >> 1/16 1/5 Right TL PICC >> 1/9 Bronch>> Mucus plugging RLL Trach 1/16  (JY) >> 1/20 trach change failed and #4 cuffed 1/21 trach upsized to 6 cuffed XLT distal  1/17 right nare cortrak >>  Significant Diagnostic Tests:  UDS 1/4 >>negative  ECHO 1/4 >>LVEF 20-25%, akinesis of the anteroseptal, anterolateral & apical myocardium, LVEF 38-10%, grade 1 diastolic dysfunction, small pericardial effusion without evidence of hemodynamic compromise CT Chest / ABD / Pelvis 1/3 >> airway thickening, motion artifact, CAD CXR 1/14> bilateral opacities, associated small pleural  effusion   Micro Data:  RVP 1/3 >> positive for influenza A BCx2 1/3 >>No growth UC 1/4 >>No Growth Sputum Cx 1/7 >> Normal respiratory flora BAL >> corynebacterium  Blood Cx 1/10 > neg  Sputum 1/21 >  Antimicrobials:  Vanco 1/3 x1;   1/21 >>  Zosyn 1/30 x1  Tamiflu 1/3 >1/8 1/10 vancomycin>1/12 1/10 ceftaz >1/12;   1/21 >>1/22 1/12 emycin > 1/22 1/23 cefepime >>  Interim history/subjective:  Ongoing agitation.  Started on precedex/ clonidine taper yesterday.  Able to wean precedex to 0.5, then increased to 1 overnight.    Ongoing purulent trach and endotracheal secretions requiring frequent suctioning in which secretions now are blood tinged.   FiO2 currently at 50%  Tmax 100.1  Patient complains of sacral pain  Objective   Blood pressure 97/82, pulse 69, temperature 98.8 F (37.1 C), temperature source Axillary, resp. rate (!) 22, height 6' (1.829 m), weight 96 kg, SpO2 95 %.    Vent Mode: PRVC FiO2 (%):  [50 %-70 %] 50 % Set Rate:  [22 bmp] 22 bmp Vt Set:  [620 mL] 620 mL PEEP:  [5 cmH20] 5 cmH20 Plateau Pressure:  [17 cmH20-21 cmH20] 19 cmH20   Intake/Output Summary (Last 24 hours) at 12/18/2018 0809 Last data filed at 12/18/2018 0700 Gross per 24 hour  Intake 2700.13 ml  Output 1200 ml  Net 1500.13 ml   Filed Weights   12/16/18 0514 12/17/18 0500 12/18/18 0406  Weight: 96.9 kg 94.7 kg 96 kg   Examination:  General:  Adult male on MV in NAD HEENT: MM pink/moist, midline 6 shiley XLT distal with copious purulent secretions around trach, right nare cortrak  Neuro: Calm, alert, mouth words,  f/c, MAE CV: SR 60's, rrr, no murmur PULM: even/non-labored on full MV support, lungs bilaterally rhonchi- thick purulent blood tinged, diminished in bases GI: soft, non-tender, bs active, condom cath in place, rectal tube in place Extremities: warm/dry, no LE edema, left BKA, partial removal of right foot Skin: no rashes, sacral decub  Resolved Hospital Problem list    Sedation related hypotension Acute pulmonary edema   Assessment & Plan:  Acute respiratory failure with hypoxemia: Influenza A, healthcare associated pneumonia (corynbacterium in sputum ).  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  Known EF of 20 to 25% with akinesis of the anterior septal anterior lateral and apical myocardium grade 1 diastolic dysfunction - +518/ remains net +17L but does not look hypervolemic on exam  P:  Tele monitoring  Cardiology following, appreciate recommendations; will need LHC when stable Defer diuresis again today- given Na 150 and thick secretions, improved renal function Daily ASA, lipitor Blood pressure does not allow beta blocker/ ACE/ ARB at this time Elective cardiac cath warranted if he will allow Goal K > 4, Mag > 2, trend BMET   AKI - adequate UOP, improved sCr today  P:  Continue to monitor UOP/ renal function  Acute metabolic encephalopathy: improving, toxic metabolic encephalopathy/ICU delirium Continue enteral Seroquel 200 mg BID, klonopin 24m BID, ativan 1103mq 8 PRN Clonidine taper (with holding parameters) to wean precedex, started 1/22 Monitor QTc Ongoing thiamine/ folate  Diabetes mellitus with acute hyperglycemia P:  Sliding scale insulin protocol resistant CBG levemir 45 units BID   Constipation s/p frequent stools  requiring flexiseal 1/22  P:  1/22 changed scheduled Senokot BID and miralax to PRN  monitor  Hypokalemia P:  KCL 40 meq x 1, mag ok  Trend BMET  Hypernatremia -free water deficit 3840 P:  Free water 30082m 6hr Trend BMP  Normocytic anemia- likely due to critical illness - stable Hgb trend P:  Trend CBC, no evidence of bleeding   Vomiting - no further epidsodes 1/22 or  1/23 - KUB 1/21 neg for ileus P:  Monitor, tolerating TF currently   Best practice:  Diet: TF Pain/Anxiety/Delirium protocol (if indicated): oral anxiolytic agents, wean precedex  VAP protocol (if indicated): yes DVT prophylaxis: lovenox GI prophylaxis: famotidine Glucose control: SSI/ levemir  Mobility: encourage OOB to chair Code Status: full Family Communication:  No family at bedside 1/23 Disposition:  ICU   Labs   CBC: Recent Labs  Lab 12/13/18 0500 12/15/18 0543 12/16/18 0856 12/17/18 0506 12/18/18 0641  WBC 11.4* 7.7 9.0 10.5 8.1  NEUTROABS  --  5.9  --  8.6*  --   HGB 10.4* 9.3* 9.4* 9.9* 9.0*  HCT 32.4* 30.7* 31.3* 33.1* 29.9*  MCV 88.5 92.2 93.7 93.5 95.5  PLT 306 329 353 [id number] Basic Metabolic Panel: Recent Labs  Lab 12/13/18 0500 12/14/18 0431 12/14/18 1928 12/15/18 0500 12/16/18  3007 12/17/18 0506 12/18/18 0607  NA 142 144 146* 147* 143 146* 150*  K 3.3* 3.2* 2.9* 3.7 5.0 3.9 3.4*  CL 101 102 113* 105 106 106 118*  CO2 _0 GLUCOSE 171* 164* 151* 97 336* 220* 90  BUN 28* 29* 28* 41* 36* 40* 33*  CREATININE 0.82 0.80 0.69 0.95 1.09 1.25* 0.99  CALCIUM 8.2* 8.0* 6.3* 8.3* 8.0* 8.7* 7.9*  MG 1.8 2.0  --  2.2  --  2.4  --   PHOS  --   --   --  5.0*  --  5.0* 3.6   GFR: Estimated Creatinine Clearance: 95.4 mL/min (by C-G formula based on SCr of 0.99 mg/dL). Recent Labs  Lab 12/15/18 0543 12/16/18 0856 12/17/18 0506 12/18/18 0641  WBC 7.7 9.0 10.5 8.1    Liver Function Tests: Recent Labs  Lab 12/12/18 0545 12/18/18 0607  AST 22   --   ALT 22  --   ALKPHOS 82  --   BILITOT 0.6  --   PROT 6.2*  --   ALBUMIN 1.5* 1.4*   No results for input(s): LIPASE, AMYLASE in the last 168 hours. No results for input(s): AMMONIA in the last 168 hours.  ABG    Component Value Date/Time   PHART 7.282 (L) 12/07/2018 0349   PCO2ART 50.2 (H) 12/07/2018 0349   PO2ART 59.0 (L) 12/07/2018 0349   HCO3 23.4 12/07/2018 0349   TCO2 25 12/07/2018 0349   ACIDBASEDEF 3.0 (H) 12/07/2018 0349   O2SAT 84.0 12/07/2018 0349     Coagulation Profile: No results for input(s): INR, PROTIME in the last 168 hours.  Cardiac Enzymes: No results for input(s): CKTOTAL, CKMB, CKMBINDEX, TROPONINI in the last 168 hours.  HbA1C: Hgb A1c MFr Bld  Date/Time Value Ref Range Status  03/25/2017 05:59 AM 12.3 (H) 4.8 - 5.6 % Final    Comment:    (NOTE)         Pre-diabetes: 5.7 - 6.4         Diabetes: >6.4         Glycemic control for adults with diabetes: <7.0   12/28/2015 08:50 PM 11.7 (H) 4.8 - 5.6 % Final    Comment:    (NOTE)         Pre-diabetes: 5.7 - 6.4         Diabetes: >6.4         Glycemic control for adults with diabetes: <7.0     CBG: Recent Labs  Lab 12/17/18 1609 12/17/18 2021 12/17/18 2337 12/18/18 0358 12/18/18 0736  GLUCAP 136* 94 147* 141* 85     Critical care time: 35 mins     Kennieth Rad, MSN, AGACNP-BC [facility] Pgr: [phone] or if no answer [phone] 12/18/2018, 8:10 AM  Attending Note:  64 year old male with influenza A and ARDS developing HCAP then was intubated and eventually trached.  Overnight, borderline fever but no new complaints.  On exam, coarse BS diffusely.  I reviewed CXR myself, trach is in a good position with infiltrate noted.  Discussed with PCCM-NP.  Will hold lasix for now.  Nebulize 3% saline.  Maintain on PS for now.  No TC.  Will need some volume negative but not at this point, low dose lasix to be ordered with K replacement.  The concern is that if patient  developed pulmonary edema everytime the patient is placed on 5/5 with his peak pressure  on full support at 35.  PCCM will continue to follow.  Cardiology workup when patient is more stable from a respiratory standpoint.  The patient is critically ill with multiple organ systems failure and requires high complexity decision making for assessment and support, frequent evaluation and titration of therapies, application of advanced monitoring technologies and extensive interpretation of multiple databases.   Critical Care Time devoted to patient care services described in this note is  32  Minutes. This time reflects time of care of this signee Dr Jennet Maduro. This critical care time does not reflect procedure time, or teaching time or supervisory time of PA/NP/Med student/Med Resident etc but could involve care discussion time.  Rush Farmer, M.D. [facility]. Pager: [phone]. After hours pager: [phone].

## 2018-12-18 NOTE — Consult Note (Signed)
WOC Nurse wound consult note Reason for Consult: Partial thickness skin loss from CORETRAK nasal system.  Wound type: medical device related skin injury Pressure Injury POA: No Measurement: 0.2 cm erythema with pinpoint nonintact center Wound ZOX:WRUEbed:pink Drainage (amount, consistency, odor) scant weeping Periwound: tubing from coretrak Dressing procedure/placement/frequency: Barrier cream as appropriate to moisturize  Will not follow at this time.  Please re-consult if needed.  Maple HudsonKaren Tamme Mozingo MSN, RN, FNP-BC CWON Wound, Ostomy, Continence Nurse Pager [phone]

## 2018-12-18 NOTE — Progress Notes (Signed)
RT NOTE:  Pt was placed onto previous vent settings. Pt displayed agitation with RR above 35 and HR above 135. Will attempt at a later time.

## 2018-12-18 NOTE — Progress Notes (Signed)
Occupational Therapy Treatment Patient Details Name: Antonio [name]. MRN: [id number] DOB: [birth year] Today's Date: 12/18/2018    History of present illness 63 yo admitted with flu A and PNA with acute respiratory failure intubated 1/4, trach 1/16 with course complicated by acute MI, CHF and cardiogenic shock. PMhx: DM, HTN, HLD, CAD, Lt BKA, rt toe amputation   OT comments  Pt presents supine in bed, RN cleared pt for EOB activity however pt initially declining participating in therapy, with encouragement he is agreeable to bed level activity. Pt engaged in bil UE/LE ROM. Notable limitations in bil shoulder ROM with increased sensitivity noted in RUE, decreased PROM noted in both RUE and LUE. Applied gentle stretching within pt pain limitations. Pt engaged in simple ADL grooming task (face washing), with assist to support L elbow during task completion. Pt attempting to engage and communicate with therapists during session though often difficult to understand. Feel POC remains appropriate at this time. Will continue to follow acutely to progress pt towards established OT goals.   Follow Up Recommendations  CIR;Supervision/Assistance - 24 hour    Equipment Recommendations  Other (comment)(Defer to next venue)          Precautions / Restrictions Precautions Precautions: Fall Precaution Comments: cortrak, vent, trach, L BKA Restrictions Weight Bearing Restrictions: No       Mobility Bed Mobility Overal bed mobility: Needs Assistance             General bed mobility comments: pt repositioned in bed for comfort end of session  Transfers                      Balance                                           ADL either performed or assessed with clinical judgement   ADL Overall ADL's : Needs assistance/impaired Eating/Feeding: NPO   Grooming: Min guard;Wash/dry face;Bed level Grooming Details (indicate cue type and reason): Min Guard A for  safety with cortrak management.                                General ADL Comments: pt declining EOB activity this date, agreeable to bed level UB/LB ROM and simple grooming ADL                        Cognition Arousal/Alertness: Awake/alert Behavior During Therapy: Restless Overall Cognitive Status: Impaired/Different from baseline Area of Impairment: Safety/judgement;Following commands                       Following Commands: Follows one step commands with increased time Safety/Judgement: Decreased awareness of safety     General Comments: pt appears more appropriate today when interacting with therapists, follows simple commands given increased time and multimodal cues; pt attempting to communicate with therapists but difficult to fully understand due to trach         Exercises Exercises: General Upper Extremity;General Lower Extremity General Exercises - Upper Extremity Shoulder Flexion: PROM;AAROM;Both;5 reps;Other (comment);Limitations Shoulder Flexion Limitations: limited in RUE to 90*, LUE 45*, provided gentle stretching Shoulder Horizontal ABduction: Left;Limitations;Supine;Other (comment);PROM Shoulder Horizontal Abduction Limitations: provided gentle stretching, limited due to pain Elbow Flexion: AAROM;AROM;Both;10 reps;Supine Elbow Extension: AROM;AAROM;Both;10 reps;Supine Wrist Flexion:  AAROM;AROM;Both;10 reps;Supine Wrist Extension: AROM;AAROM;Both;10 reps;Supine Digit Composite Flexion: AROM;AAROM;Both;10 reps;Supine Composite Extension: AROM;AAROM;Both;10 reps;Supine   Shoulder Instructions       General Comments      Pertinent Vitals/ Pain       Pain Assessment: Faces Faces Pain Scale: Hurts even more Pain Location: Grimacing with certain movements during ROM Pain Descriptors / Indicators: Grimacing Pain Intervention(s): Monitored during session;Repositioned;Limited activity within patient's tolerance  Home Living                                           Prior Functioning/Environment              Frequency  Min 2X/week        Progress Toward Goals  OT Goals(current goals can now be found in the care plan section)  Progress towards OT goals: Progressing toward goals  Acute Rehab OT Goals Patient Stated Goal: to rest OT Goal Formulation: With patient Time For Goal Achievement: 12/27/18 Potential to Achieve Goals: Good ADL Goals Pt Will Perform Grooming: with min guard assist;sitting Pt Will Perform Upper Body Dressing: with min guard assist;sitting Pt Will Transfer to Toilet: with min assist;with transfer board;bedside commode Additional ADL Goal #1: Pt will perform bed mobility with Min A in preparation for ADLs Additional ADL Goal #2: Pt will sit at EOB for ~5 minutes with Min Guard A in preparation for ADLs Additional ADL Goal #3: Pt will follow 50% on cues in quiet environement during ADL  Plan Discharge plan remains appropriate    Co-evaluation    PT/OT/SLP Co-Evaluation/Treatment: Yes Reason for Co-Treatment: Necessary to address cognition/behavior during functional activity;Complexity of the patient's impairments (multi-system involvement);For patient/therapist safety;To address functional/ADL transfers   OT goals addressed during session: ADL's and self-care;Strengthening/ROM      AM-PAC OT "6 Clicks" Daily Activity     Outcome Measure   Help from another person eating meals?: Total Help from another person taking care of personal grooming?: A Little Help from another person toileting, which includes using toliet, bedpan, or urinal?: A Lot Help from another person bathing (including washing, rinsing, drying)?: Total Help from another person to put on and taking off regular upper body clothing?: Total Help from another person to put on and taking off regular lower body clothing?: Total 6 Click Score: 9    End of Session Equipment Utilized During  Treatment: Oxygen;Other (comment)(vent)  OT Visit Diagnosis: Unsteadiness on feet (R26.81);Other abnormalities of gait and mobility (R26.89);Muscle weakness (generalized) (M62.81);Other symptoms and signs involving cognitive function   Activity Tolerance Patient tolerated treatment well;Other (comment);Patient limited by fatigue(pt self-limiting, declining EOB)   Patient Left with restraints reapplied;in bed;with bed alarm set(RT present)   Nurse Communication Mobility status        Time: 8937-3428 OT Time Calculation (min): 25 min  Charges: OT General Charges $OT Visit: 1 Visit OT Treatments $Therapeutic Activity: 8-22 mins  Marcy Siren, OT Supplemental Rehabilitation Services Pager [phone] Office [phone]    Orlando Penner 12/18/2018, 4:31 PM

## 2018-12-18 NOTE — Progress Notes (Signed)
RT NOTE:  BED CPT was refused by the pt. Will attempt at a later time.

## 2018-12-18 NOTE — Progress Notes (Signed)
SLP Cancellation Note  Patient Details Name: Antonio [name]. MRN: [id number] DOB: 05-15-1956   Cancelled treatment:       Reason Eval/Treat Not Completed: Other (comment).  Spoke with PCCM regarding inline PM valve with plan to begin trials; upon entering room with RT, pt tachycardic to 135, diaphoretic.  Will hold on inline PMV today and continue efforts.  D/W RT.    [name] 12/18/2018, 3:07 PM  Marchelle Folks L. Samson Frederic, MA CCC/SLP Acute Rehabilitation Services Office number [phone] Pager [phone]

## 2018-12-18 NOTE — Progress Notes (Signed)
Physical Therapy Treatment Patient Details Name: Antonio RichardsDaniel Watson Kleinfelter Jr. MRN: [id number] DOB: 1955-11-27 Today's Date: 12/18/2018    History of Present Illness 63 yo admitted with flu A and PNA with acute respiratory failure intubated 1/4, trach 1/16 with course complicated by acute MI, CHF and cardiogenic shock. PMhx: DM, HTN, HLD, CAD, Lt BKA, rt toe amputation    PT Comments    Session limited by pt to bed exercises.  Between PT/OT, completed a comprehensive exercise program for all 4 extremities.  Repositioned.   Follow Up Recommendations  CIR;Supervision/Assistance - 24 hour     Equipment Recommendations  None recommended by PT    Recommendations for Other Services       Precautions / Restrictions Precautions Precautions: Fall Precaution Comments: cortrak, vent, trach, L BKA Restrictions Weight Bearing Restrictions: No    Mobility  Bed Mobility Overal bed mobility: Needs Assistance             General bed mobility comments: pt repositioned in bed for comfort end of session  Transfers                 General transfer comment: pt deferred mobility  Ambulation/Gait                 Stairs             Wheelchair Mobility    Modified Rankin (Stroke Patients Only)       Balance                                            Cognition Arousal/Alertness: Awake/alert Behavior During Therapy: Restless Overall Cognitive Status: Impaired/Different from baseline Area of Impairment: Safety/judgement;Following commands                       Following Commands: Follows one step commands with increased time Safety/Judgement: Decreased awareness of safety     General Comments: pt appears more appropriate today when interacting with therapists, follows simple commands given increased time and multimodal cues; pt attempting to communicate with therapists but difficult to fully understand due to trach       Exercises  General Exercises - Upper Extremity Shoulder Flexion: PROM;AAROM;Both;5 reps;Other (comment);Limitations Shoulder Flexion Limitations: limited in RUE to 90*, LUE 45*, provided gentle stretching Shoulder Horizontal ABduction: Left;Limitations;Supine;Other (comment);PROM Shoulder Horizontal Abduction Limitations: provided gentle stretching, limited due to pain Elbow Flexion: AAROM;AROM;Both;10 reps;Supine Elbow Extension: AROM;AAROM;Both;10 reps;Supine Wrist Flexion: AAROM;AROM;Both;10 reps;Supine Wrist Extension: AROM;AAROM;Both;10 reps;Supine Digit Composite Flexion: AROM;AAROM;Both;10 reps;Supine Composite Extension: AROM;AAROM;Both;10 reps;Supine General Exercises - Lower Extremity Ankle Circles/Pumps: AROM;15 reps(with heel cord stretch) Heel Slides: AROM;10 reps;Supine(graded resistance mostly into extension) Other Exercises Other Exercises: L Le hip extension,  and isolated quads/hams    General Comments        Pertinent Vitals/Pain Pain Assessment: Faces Faces Pain Scale: Hurts even more Pain Location: Grimacing with certain movements during ROM Pain Descriptors / Indicators: Grimacing Pain Intervention(s): Monitored during session    Home Living                      Prior Function            PT Goals (current goals can now be found in the care plan section) Acute Rehab PT Goals Patient Stated Goal: to rest PT Goal Formulation: With patient Time For Goal Achievement:  12/26/18 Potential to Achieve Goals: Good Progress towards PT goals: Not progressing toward goals - comment(didn't want to do much, encouraged exercise in the least.)    Frequency    Min 3X/week      PT Plan Current plan remains appropriate    Co-evaluation PT/OT/SLP Co-Evaluation/Treatment: Yes Reason for Co-Treatment: Necessary to address cognition/behavior during functional activity PT goals addressed during session: Mobility/safety with mobility OT goals addressed during  session: ADL's and self-care      AM-PAC PT "6 Clicks" Mobility   Outcome Measure  Help needed turning from your back to your side while in a flat bed without using bedrails?: A Lot Help needed moving from lying on your back to sitting on the side of a flat bed without using bedrails?: A Lot Help needed moving to and from a bed to a chair (including a wheelchair)?: A Lot Help needed standing up from a chair using your arms (e.g., wheelchair or bedside chair)?: A Lot Help needed to walk in hospital room?: Total Help needed climbing 3-5 steps with a railing? : Total 6 Click Score: 10    End of Session   Activity Tolerance: Patient tolerated treatment well Patient left: in bed;with call bell/phone within reach Nurse Communication: Mobility status;Need for lift equipment PT Visit Diagnosis: Other abnormalities of gait and mobility (R26.89);Muscle weakness (generalized) (M62.81);Unsteadiness on feet (R26.81)     Time: [id number] PT Time Calculation (min) (ACUTE ONLY): 25 min  Charges:  $Therapeutic Exercise: 8-22 mins                     12/18/2018  [place] BingKen Antonio [name], PT Acute Rehabilitation Services [phone]  (pager) [phone]  (office)   Antonio [name] 12/18/2018, 5:52 PM

## 2018-12-18 NOTE — Progress Notes (Addendum)
Progress Note  Patient Name: Antonio [name]. Date of Encounter: 12/18/2018  Primary Cardiologist: Minus Breeding, MD   Subjective   No acute issues overnight.   Inpatient Medications    Scheduled Meds: . aspirin  81 mg Per Tube Daily  . atorvastatin  40 mg Oral q1800  . bethanechol  10 mg Per Tube QID  . chlorhexidine gluconate (MEDLINE KIT)  15 mL Mouth Rinse BID  . Chlorhexidine Gluconate Cloth  6 each Topical Daily  . clonazePAM  1 mg Per Tube BID  . cloNIDine  0.1 mg Per Tube Q6H  . cloNIDine  0.1 mg Per Tube BID  . [START ON 12/20/2018] cloNIDine  0.1 mg Per Tube Daily  . enoxaparin (LOVENOX) injection  40 mg Subcutaneous Daily  . famotidine  20 mg Per Tube BID  . feeding supplement (PRO-STAT SUGAR FREE 64)  60 mL Per Tube BID  . fentaNYL  75 mcg Transdermal Q72H  . folic acid  1 mg Per Tube Daily  . free water  300 mL Per Tube Q6H  . insulin aspart  0-20 Units Subcutaneous Q4H  . insulin detemir  45 Units Subcutaneous BID  . ipratropium-albuterol  3 mL Nebulization TID  . mouth rinse  15 mL Mouth Rinse 10 times per day  . multivitamin  15 mL Oral Daily  . QUEtiapine  200 mg Per Tube BID  . sodium chloride flush  10-40 mL Intracatheter Q12H  . thiamine  100 mg Per Tube Daily   Continuous Infusions: . sodium chloride Stopped (12/18/18 0836)  . ceFEPime (MAXIPIME) IV 200 mL/hr at 12/18/18 0900  . dexmedetomidine 0.6 mcg/kg/hr (12/18/18 0900)  . feeding supplement (VITAL 1.5 CAL) 1,000 mL (12/18/18 0600)  . vancomycin 1,500 mg (12/18/18 0625)   PRN Meds: sodium chloride, acetaminophen (TYLENOL) oral liquid 160 mg/5 mL, albuterol, bisacodyl, docusate, fentaNYL (SUBLIMAZE) injection, ibuprofen, LORazepam, polyethylene glycol, sodium chloride flush   Vital Signs    Vitals:   12/18/18 0803 12/18/18 0830 12/18/18 0900 12/18/18 0930  BP:  (!) 112/56 106/68 122/60  Pulse:  71 74 78  Resp:  (!) 22 (!) 22 (!) 22  Temp:      TempSrc:      SpO2: 95% 94% 92%  94%  Weight:      Height:        Intake/Output Summary (Last 24 hours) at 12/18/2018 0959 Last data filed at 12/18/2018 0941 Gross per 24 hour  Intake 3149.79 ml  Output 1282.5 ml  Net 1867.29 ml   Last 3 Weights 12/18/2018 12/17/2018 12/16/2018  Weight (lbs) 211 lb 10.3 oz 208 lb 12.4 oz 213 lb 10 oz  Weight (kg) 96 kg 94.7 kg 96.9 kg      Telemetry    NSR - Personally Reviewed  ECG    Not performed today - Personally Reviewed  Physical Exam   GEN: middle aged WM w/ trach in No acute distress.   Neck: No JVD Cardiac: RRR, no murmurs, rubs, or gallops.  Respiratory: remains on Vent, bilateral rhonchi  GI: Soft, nontender, non-distended  MS: left BKA and right LE digital amputee, No edema Neuro:  metabolic encephalopathy  Psych: Normal affect   Labs    Chemistry Recent Labs  Lab 12/12/18 0545  12/16/18 0856 12/17/18 0506 12/18/18 0607  NA 139   < > 143 146* 150*  K 3.8   < > 5.0 3.9 3.4*  CL 104   < > 106 106 118*  CO2 28   < > _0 GLUCOSE 286*   < > 336* 220* 90  BUN 32*   < > 36* 40* 33*  CREATININE 0.93   < > 1.09 1.25* 0.99  CALCIUM 8.1*   < > 8.0* 8.7* 7.9*  PROT 6.2*  --   --   --   --   ALBUMIN 1.5*  --   --   --  1.4*  AST 22  --   --   --   --   ALT 22  --   --   --   --   ALKPHOS 82  --   --   --   --   BILITOT 0.6  --   --   --   --   GFRNONAA >60   < > >60 >60 >60  GFRAA >60   < > >60 >60 >60  ANIONGAP 7   < > _1 < > = values in this interval not displayed.     Hematology Recent Labs  Lab 12/16/18 0856 12/17/18 0506 12/18/18 0641  WBC 9.0 10.5 8.1  RBC 3.34* 3.54* 3.13*  HGB 9.4* 9.9* 9.0*  HCT 31.3* 33.1* 29.9*  MCV 93.7 93.5 95.5  MCH 28.1 28.0 28.8  MCHC 30.0 29.9* 30.1  RDW 13.7 13.6 13.7  PLT 353 388 362    Cardiac EnzymesNo results for input(s): TROPONINI in the last 168 hours. No results for input(s): TROPIPOC in the last 168 hours.   BNP Recent Labs  Lab 12/17/18 0506  BNP 48.7     DDimer No results  for input(s): DDIMER in the last 168 hours.   Radiology    Dg Chest Port 1 View  Result Date: 12/17/2018 CLINICAL DATA:  Hypoxia EXAM: PORTABLE CHEST 1 VIEW COMPARISON:  12/16/2018 FINDINGS: Right-sided PICC line, feeding catheter and tracheostomy tube are noted in place. Cardiac shadow remains enlarged but stable. Bibasilar atelectatic changes and small effusions are again identified. No new focal abnormality is seen. IMPRESSION: No significant change from the prior exam. Electronically Signed   By: Inez Catalina M.D.   On: 12/17/2018 07:45   Dg Abd Portable 1v  Result Date: 12/16/2018 CLINICAL DATA:  Vomiting EXAM: PORTABLE ABDOMEN - 1 VIEW COMPARISON:  12/04/2018 FINDINGS: Feeding catheter is noted within the distal stomach. Scattered large and small bowel gas is noted without obstructive change. No free air is seen. No acute bony abnormality is noted. IMPRESSION: No acute abnormality noted. Electronically Signed   By: Inez Catalina M.D.   On: 12/16/2018 12:26    Cardiac Studies   Echo 11/29/2018 Study Conclusions - Left ventricle: The cavity size was normal. Wall thickness was normal. Systolic function was severely reduced. The estimated ejection fraction was in the range of 20% to 25%. There is akinesis of the anteroseptal, anterolateral, and apical myocardium. Doppler parameters are consistent with abnormal left ventricular relaxation (grade 1 diastolic dysfunction). - Pericardium, extracardiac: A small pericardial effusion was identified circumferential to the heart. The fluid exhibited a fibrinous appearance.There was no evidence of hemodynamic compromise.  Patient Profile     63 y.o.malewith PMH of CAD, ICM with previous baseline of 40%, HTN, HLD and DM II who presented01/03with acute respiratory failure due to influenza. EF was 20-25% on echo. He was intubated and treated for cardiogenic shock and respiratory failure>>trach 01/16. Pending left and right  heartcathonce more medically stable.   Assessment & Plan    1.  Acute Respiratory Failure: in the setting of influenza, complicated by corynebacterium PNA and pulmonary edema. S/p trach 1/16 w/ revision 1/21. Vent management per PCCM. Was treated w/ IV diuretics w/ improvement of volume status. New issues with respiratory secretions and worsening hypoxia. On broad spectrum abx. PCCM managing.   2. Cardiomyopathy/ Acute on Chronic Systolic CHF: previous EF was 40%. New EF now 20-25%. He will need cardiac cath for further w/u to exclude obstructive CAD, but prefer to do once more medically stable. He was treated w/ IV diuretics and volume status improved. F/u BNP yesterday was back WNL at 48.7 and IV Lasix was discontinued due to AKI. Continue to monitor volume status closely. Resume diuretics if needed. Other medical therapy for systolic HF has been limited by soft BP. Pressures however have been running higher today. May be able to add a low dose  blocker or low dose ARB soon.   3. AKI:  diuretics were discontinued yesterday and AKI resolved. SCr back WNL today, down from 1.25>>0.99. BUN improved from 40>>33.   4. Pulmonary: pt required tracheostomy revision 12/16/18. Despite trach change, he continued to remain hypoxic requiring 80% FiO2 to keep O2 sats in low 90s yesterday. Also noted to have thick airway secretions concerning for infection. He was placed on broad spectrum Abx. Further w/u and management per PCCM.   For questions or updates, please contact [place] Please consult [email or web address] for contact info under      Signed, Lyda Jester, PA-C  12/18/2018, 9:59 AM    History and all data above reviewed.  Patient examined.  I agree with the findings as above.  He denies chest pain and feels like he is getting enough air. The patient exam reveals COR:RRR  ,  Lungs: Decreased breath sounds without wheezing  ,  Abd: Tense but non tender.  Decreased bowel sounds, Ext no edema  .   All available labs, radiology testing, previous records reviewed. Agree with documented assessment and plan. Acute on chronic systolic HF.  I suspect a cardiac etiology although he did not want work up in the past.  Consider right and left heart cath at some point.  His family would like to defer until he is "stronger."  Conservation officer, nature. He has been on tapered clonidine which CCM explained to me is a protocol for weaning precedex.   When this is off we will likely be able to start after load reduction.    Jeneen Rinks [facility]  10:37 AM  12/18/2018

## 2018-12-19 ENCOUNTER — Inpatient Hospital Stay (HOSPITAL_COMMUNITY): Payer: Medicaid Other

## 2018-12-19 DIAGNOSIS — Z0189 Encounter for other specified special examinations: Secondary | ICD-10-CM

## 2018-12-19 LAB — GLUCOSE, CAPILLARY
GLUCOSE-CAPILLARY: 170 mg/dL — AB (ref 70–99)
Glucose-Capillary: 148 mg/dL — ABNORMAL HIGH (ref 70–99)
Glucose-Capillary: 113 mg/dL — ABNORMAL HIGH (ref 70–99)
Glucose-Capillary: 126 mg/dL — ABNORMAL HIGH (ref 70–99)
Glucose-Capillary: 120 mg/dL — ABNORMAL HIGH (ref 70–99)
Glucose-Capillary: 128 mg/dL — ABNORMAL HIGH (ref 70–99)

## 2018-12-19 LAB — CBC
HCT: 30.3 % — ABNORMAL LOW (ref 39.0–52.0)
Hemoglobin: 8.8 g/dL — ABNORMAL LOW (ref 13.0–17.0)
MCH: 27.9 pg (ref 26.0–34.0)
MCHC: 29 g/dL — ABNORMAL LOW (ref 30.0–36.0)
MCV: 96.2 fL (ref 80.0–100.0)
Platelets: 354 10*3/uL (ref 150–400)
RBC: 3.15 MIL/uL — ABNORMAL LOW (ref 4.22–5.81)
RDW: 14 % (ref 11.5–15.5)
WBC: 6.5 10*3/uL (ref 4.0–10.5)
nRBC: 0 % (ref 0.0–0.2)

## 2018-12-19 LAB — RENAL FUNCTION PANEL
Albumin: 1.5 g/dL — ABNORMAL LOW (ref 3.5–5.0)
Anion gap: 7 (ref 5–15)
BUN: 32 mg/dL — ABNORMAL HIGH (ref 6–20)
CALCIUM: 8.2 mg/dL — AB (ref 8.9–10.3)
CO2: 25 mmol/L (ref 22–32)
Chloride: 117 mmol/L — ABNORMAL HIGH (ref 98–111)
Creatinine, Ser: 1 mg/dL (ref 0.61–1.24)
Glucose, Bld: 165 mg/dL — ABNORMAL HIGH (ref 70–99)
Phosphorus: 4.6 mg/dL (ref 2.5–4.6)
Potassium: 3.7 mmol/L (ref 3.5–5.1)
Sodium: 149 mmol/L — ABNORMAL HIGH (ref 135–145)

## 2018-12-19 LAB — PHOSPHORUS: Phosphorus: 4.6 mg/dL (ref 2.5–4.6)

## 2018-12-19 LAB — CULTURE, RESPIRATORY W GRAM STAIN

## 2018-12-19 LAB — MAGNESIUM: Magnesium: 2.5 mg/dL — ABNORMAL HIGH (ref 1.7–2.4)

## 2018-12-19 LAB — CULTURE, RESPIRATORY

## 2018-12-19 LAB — VANCOMYCIN, PEAK: Vancomycin Pk: 35 ug/mL (ref 30–40)

## 2018-12-19 MED ORDER — CLONIDINE HCL 0.1 MG PO TABS
0.1000 mg | ORAL_TABLET | Freq: Four times a day (QID) | ORAL | Status: AC
  Administered 2018-12-19 – 2018-12-22 (×14): 0.1 mg
  Filled 2018-12-19 (×13): qty 1

## 2018-12-19 MED ORDER — POTASSIUM CHLORIDE 20 MEQ/15ML (10%) PO SOLN
20.0000 meq | ORAL | Status: AC
  Administered 2018-12-19 (×2): 20 meq
  Filled 2018-12-19 (×2): qty 15

## 2018-12-19 MED ORDER — FUROSEMIDE 10 MG/ML IJ SOLN
INTRAMUSCULAR | Status: AC
  Filled 2018-12-19: qty 4

## 2018-12-19 MED ORDER — LOSARTAN POTASSIUM 50 MG PO TABS
25.0000 mg | ORAL_TABLET | Freq: Every day | ORAL | Status: DC
  Administered 2018-12-19 – 2019-01-05 (×18): 25 mg via ORAL
  Filled 2018-12-19 (×19): qty 1

## 2018-12-19 MED ORDER — FUROSEMIDE 10 MG/ML IJ SOLN
20.0000 mg | Freq: Two times a day (BID) | INTRAMUSCULAR | Status: AC
  Administered 2018-12-19 – 2018-12-20 (×2): 20 mg via INTRAVENOUS
  Filled 2018-12-19: qty 2

## 2018-12-19 NOTE — Progress Notes (Signed)
Physical Therapy Wound Treatment Patient Details  Name: Antonio [name]. MRN: [id number] Date of Birth: 1956-01-08  Today's Date: 12/19/2018 Time: 2197-5883 Time Calculation (min): 42 min  Subjective  Subjective: Pt mouthing, "Ok," to hydrotherapy. Patient and Family Stated Goals: none stated Date of Onset: (unknown) Prior Treatments: dressing change  Pain Score:  Verbalizing some pain with debridement; Pre medicated and monitored during session  Wound Assessment  Pressure Injury 12/01/18 Unstageable - Full thickness tissue loss in which the base of the ulcer is covered by slough (yellow, tan, gray, green or brown) and/or eschar (tan, brown or black) in the wound bed. sacrum (Active)  Dressing Type Gauze (Comment);Moist to dry;ABD;Other (Comment) 12/19/2018 10:46 AM  Dressing Changed;Clean;Dry;Intact 12/19/2018 10:46 AM  Dressing Change Frequency Daily 12/19/2018 10:46 AM  State of Healing Other (Comment) 12/19/2018 10:46 AM  Site / Wound Assessment Red;Yellow 12/19/2018 10:46 AM  % Wound base Red or Granulating 5% 12/19/2018 10:46 AM  % Wound base Yellow/Fibrinous Exudate 90% 12/19/2018 10:46 AM  % Wound base Black/Eschar 0% 12/19/2018 10:46 AM  % Wound base Other/Granulation Tissue (Comment) 5% 12/19/2018 10:46 AM  Peri-wound Assessment Purple;Excoriated;Erythema (blanchable) 12/19/2018 10:46 AM  Wound Length (cm) 8.5 cm 12/18/2018  9:18 AM  Wound Width (cm) 4 cm 12/18/2018  9:18 AM  Wound Depth (cm) 0.1 cm 12/18/2018  9:18 AM  Wound Surface Area (cm^2) 34 cm^2 12/18/2018  9:18 AM  Wound Volume (cm^3) 3.4 cm^3 12/18/2018  9:18 AM  Tunneling (cm) 2.5cm at 6:oo on clock 12/11/2018  8:00 PM  Margins Unattached edges (unapproximated) 12/19/2018 10:46 AM  Drainage Amount Minimal 12/19/2018 10:46 AM  Drainage Description Serosanguineous 12/19/2018 10:46 AM  Treatment Debridement (Selective);Hydrotherapy (Pulse lavage) 12/19/2018 10:46 AM   Hydrotherapy Pulsed lavage therapy - wound location:  sacrum Pulsed Lavage with Suction (psi): 12 psi(8-12) Pulsed Lavage with Suction - Normal Saline Used: 1000 mL Pulsed Lavage Tip: Tip with splash shield Selective Debridement Selective Debridement - Location: sacrum Selective Debridement - Tools Used: Forceps;Scalpel Selective Debridement - Tissue Removed: removal of yellow nonviable tissue   Wound Assessment and Plan  Wound Therapy - Assess/Plan/Recommendations Wound Therapy - Clinical Statement: Pt presenting with softening of yellow nonviable tissue, allowing for initiating removal of slough. Will benefit from continued hydrotherapy for removal of nonviable tissue. Wound Therapy - Functional Problem List: decr mobility Factors Delaying/Impairing Wound Healing: Diabetes Mellitus;Immobility;Multiple medical problems Hydrotherapy Plan: Debridement;Dressing change;Patient/family education;Pulsatile lavage with suction Wound Therapy - Frequency: 6X / week Wound Therapy - Follow Up Recommendations: Other (comment)(CIR) Wound Plan: see above  Wound Therapy Goals- Improve the function of patient's integumentary system by progressing the wound(s) through the phases of wound healing (inflammation - proliferation - remodeling) by: Decrease Necrotic Tissue to: 70 Decrease Necrotic Tissue - Progress: Progressing toward goal Increase Granulation Tissue to: 30 Increase Granulation Tissue - Progress: Progressing toward goal Goals/treatment plan/discharge plan were made with and agreed upon by patient/family: Yes Time For Goal Achievement: 7 days Wound Therapy - Potential for Goals: Good  Goals will be updated until maximal potential achieved or discharge criteria met.  Discharge criteria: when goals achieved, discharge from hospital, MD decision/surgical intervention, no progress towards goals, refusal/missing three consecutive treatments without notification or medical reason.  GP    [name], PT, DPT Acute Rehabilitation Services Pager  [phone] Office [phone]   [name] 12/19/2018, 10:53 AM

## 2018-12-19 NOTE — Progress Notes (Signed)
Pt weaned for approx 45 minutes on Jan 23rd, became tachycardic, tachypnic and placed back on vent.   Pt weaned this evening for approx 30-45 minutes before becoming tachycardic in the 120s, hypertensive, and tachypnic. Respiratory called to request pt be flipped back.

## 2018-12-19 NOTE — Progress Notes (Addendum)
Progress Note  Patient Name: Antonio [name]. Date of Encounter: 12/19/2018  Primary Cardiologist: Minus Breeding, MD   Subjective   No events overnight. He did have a 8 beat run of NSVT on tele yesterday but no recurrence overnight.   Inpatient Medications    Scheduled Meds: . aspirin  81 mg Per Tube Daily  . atorvastatin  40 mg Oral q1800  . bethanechol  10 mg Per Tube QID  . chlorhexidine gluconate (MEDLINE KIT)  15 mL Mouth Rinse BID  . Chlorhexidine Gluconate Cloth  6 each Topical Daily  . clonazePAM  1 mg Per Tube BID  . [START ON 12/20/2018] cloNIDine  0.1 mg Per Tube Daily  . enoxaparin (LOVENOX) injection  40 mg Subcutaneous Daily  . famotidine  20 mg Per Tube BID  . feeding supplement (PRO-STAT SUGAR FREE 64)  60 mL Per Tube BID  . fentaNYL  75 mcg Transdermal Q72H  . folic acid  1 mg Per Tube Daily  . free water  300 mL Per Tube Q6H  . insulin aspart  0-20 Units Subcutaneous Q4H  . insulin detemir  45 Units Subcutaneous BID  . ipratropium-albuterol  3 mL Nebulization TID  . mouth rinse  15 mL Mouth Rinse 10 times per day  . multivitamin  15 mL Oral Daily  . QUEtiapine  200 mg Per Tube BID  . sodium chloride flush  10-40 mL Intracatheter Q12H  . sodium chloride HYPERTONIC  4 mL Nebulization Daily  . thiamine  100 mg Per Tube Daily   Continuous Infusions: . sodium chloride 10 mL/hr at 12/19/18 0900  . ceFEPime (MAXIPIME) IV Stopped (12/19/18 7989)  . dexmedetomidine 0.3 mcg/kg/hr (12/19/18 0900)  . feeding supplement (VITAL 1.5 CAL) 55 mL/hr at 12/19/18 0700  . vancomycin Stopped (12/19/18 0708)   PRN Meds: sodium chloride, acetaminophen (TYLENOL) oral liquid 160 mg/5 mL, albuterol, bisacodyl, docusate, fentaNYL (SUBLIMAZE) injection, ibuprofen, LORazepam, polyethylene glycol, sodium chloride flush   Vital Signs    Vitals:   12/19/18 0700 12/19/18 0800 12/19/18 0839 12/19/18 0936  BP: (!) 152/73 137/77  137/71  Pulse: 95 91    Resp: (!) 30 (!) 23     Temp:   98.9 F (37.2 C)   TempSrc:   Oral   SpO2: 98% 96% 96%   Weight:      Height:        Intake/Output Summary (Last 24 hours) at 12/19/2018 1004 Last data filed at 12/19/2018 0900 Gross per 24 hour  Intake 5741.66 ml  Output 3225 ml  Net 2516.66 ml   Last 3 Weights 12/19/2018 12/18/2018 12/17/2018  Weight (lbs) 212 lb 11.9 oz 211 lb 10.3 oz 208 lb 12.4 oz  Weight (kg) 96.5 kg 96 kg 94.7 kg      Telemetry    NSR currently 8 beat run of NSVT 1/23 - Personally Reviewed  ECG    Not performed today - Personally Reviewed  Physical Exam   GEN: middle aged WM, w/ trach, No acute distress.   Neck: No JVD Cardiac: RRR, no murmurs, rubs, or gallops.  Respiratory: Clear to auscultation bilaterally. GI: Soft, nontender, non-distended  MS: No edema; No deformity. Neuro:  Nonfocal  Psych: Normal affect   Labs    Chemistry Recent Labs  Lab 12/17/18 0506 12/18/18 0607 12/19/18 0520  NA 146* 150* 149*  K 3.9 3.4* 3.7  CL 106 118* 117*  CO2 '29 27 25  ' GLUCOSE 220* 90 165*  BUN  40* 33* 32*  CREATININE 1.25* 0.99 1.00  CALCIUM 8.7* 7.9* 8.2*  ALBUMIN  --  1.4* 1.5*  GFRNONAA >60 >60 >60  GFRAA >60 >60 >60  ANIONGAP '11 5 7     ' Hematology Recent Labs  Lab 12/17/18 0506 12/18/18 0641 12/19/18 0520  WBC 10.5 8.1 6.5  RBC 3.54* 3.13* 3.15*  HGB 9.9* 9.0* 8.8*  HCT 33.1* 29.9* 30.3*  MCV 93.5 95.5 96.2  MCH 28.0 28.8 27.9  MCHC 29.9* 30.1 29.0*  RDW 13.6 13.7 14.0  PLT 388 362 354    Cardiac EnzymesNo results for input(s): TROPONINI in the last 168 hours. No results for input(s): TROPIPOC in the last 168 hours.   BNP Recent Labs  Lab 12/17/18 0506  BNP 48.7     DDimer No results for input(s): DDIMER in the last 168 hours.   Radiology    Dg Chest Port 1 View  Result Date: 12/19/2018 CLINICAL DATA:  Respiratory failure. EXAM: PORTABLE CHEST 1 VIEW COMPARISON:  December 18, 2018. FINDINGS: Endotracheal tube tip is 6.3 cm above the carina. Feeding tube  tip is below the diaphragm. Central catheter tip is in the right atrium, stable. No pneumothorax. There are pleural effusions bilaterally with areas of consolidation in both bases. Upper lung zones are clear. Heart is mildly enlarged with pulmonary vascularity within normal limits. No adenopathy. No bone lesions. IMPRESSION: And catheter positions as described without pneumothorax. Persistent pleural effusions with areas of bibasilar consolidation. Concern for potential pneumonia in the bases with associated atelectasis. No new opacity evident. Stable cardiac silhouette. Electronically Signed   By: Lowella Grip III M.D.   On: 12/19/2018 07:12   Dg Chest Port 1 View  Result Date: 12/18/2018 CLINICAL DATA:  Patient on ventilator.  Respiratory failure EXAM: PORTABLE CHEST 1 VIEW COMPARISON:  December 17, 2018 FINDINGS: The tracheostomy tube and right PICC line remain in place. The feeding tube is not well seen distally due to poor penetration. There appear to be small bilateral layering effusions with underlying atelectasis. The cardiomediastinal silhouette is stable. No other abnormalities. IMPRESSION: Suggested small layering pleural effusions with underlying opacities. No other interval changes. Electronically Signed   By: Dorise Bullion III M.D   On: 12/18/2018 10:57    Cardiac Studies   Echo 11/29/2018 Study Conclusions - Left ventricle: The cavity size was normal. Wall thickness was normal. Systolic function was severely reduced. The estimated ejection fraction was in the range of 20% to 25%. There is akinesis of the anteroseptal, anterolateral, and apical myocardium. Doppler parameters are consistent with abnormal left ventricular relaxation (grade 1 diastolic dysfunction). - Pericardium, extracardiac: A small pericardial effusion was identified circumferential to the heart. The fluid exhibited a fibrinous appearance.There was no evidence of  hemodynamic compromise.  Patient Profile     63 y.o.malewith PMH of CAD, ICM with previous baseline of 40%, HTN, HLD and DM II who presented01/03with acute respiratory failure due to influenza. EF was 20-25% on echo. He was intubated and treated for cardiogenic shock and respiratory failure>>trach 01/16. Pending left and right heartcathoncemore medically stable.  Assessment & Plan   1. Acute Respiratory Failure: in the setting of influenza, complicated by corynebacterium PNA and pulmonary edema. S/p trach 1/16w/ revision 1/21. Vent management per PCCM.Was treated w/IV diuretics w/ improvement of volume status. Recovered from flu. New issues with respiratory secretions and worsening hypoxia. On broad spectrum abx. PCCM managing.   2. Cardiomyopathy/ Acute on Chronic Systolic CHF: previous EF was 40%. New EF  now 20-25%. He will need R/L cardiac cath for further w/u to exclude obstructive CAD, but prefer to do once more medically stable. He was treated w/ IV diuretics and volume status improved. F/u BNP 1/22 was back WNL at 48.7 and IV Lasix was discontinued due to AKI. Volume remains stable off of diuretics. Continue to monitor volume status closely. Resume diuretics if needed. Other medical therapy for systolic HF was initially limited by soft BP.BP is running a little bit higher now and he is on clonidine, being tapered. This is protocol for weaning precedex. Once off clonidine, will try to add low dose ? blocker or low dose ARB.  3. AKI: diuretics were discontinued 1/22 and AKI resolved. SCr back WNL. 1.00 today. Continue to monitor.   4. NSVT: 8 beat run noted yesterday. No recurrence overnight. This is in the setting of cardiomyopathy w/ EF at 20-25%. K WNL today. Currently not on  blocker therapy for reasons outlined above. Will need to continue to monitor on tele. If frequent recurrence, check Mg level. Keep electrolytes stable.    For questions or updates, please contact  [facility] Please consult [email or web address] for contact info under        Signed, Lyda Jester, PA-C  12/19/2018, 10:04 AM    History and all data above reviewed.  Patient examined.  I agree with the findings as above.  No chest pain.  Complains of buttocks pain.  The patient exam reveals COR:RRR  ,  Lungs: Clear  ,  Abd: Positive bowel sounds, no rebound no guarding, Ext No edema  .  All available labs, radiology testing, previous records reviewed. Agree with documented assessment and plan.  Looks like BP will now allow med titration.  I will start a low dose of ARB.  Clonidine should be off this weekend.  Likely will be able to titrate meds further.    Jeneen Rinks Christ Fullenwider  1:41 PM  12/19/2018

## 2018-12-19 NOTE — Progress Notes (Signed)
  Speech Language Pathology :    Patient Details Name: Antonio [name]. MRN: [id number] DOB: [birth year] Today's Date: 12/19/2018 Time:  -     Spoke with RT re: possible inline PMV. Stated that pt has been very agitated and did not think it would be a good idea at present. Will continue to follow for inline valve or valve on trach hub if pt able to wean (has not thus far)             GO                Roque Cash, Breck Coons 12/19/2018, 3:13 PM  Breck Coons Lonell Face.Ed Nurse, children's [phone] Office [phone]

## 2018-12-19 NOTE — Plan of Care (Signed)
  Problem: Nutrition: Goal: Adequate nutrition will be maintained Outcome: Progressing   Problem: Elimination: Goal: Will not experience complications related to bowel motility Outcome: Progressing Goal: Will not experience complications related to urinary retention Outcome: Progressing   

## 2018-12-19 NOTE — Progress Notes (Signed)
NAME:  Antonio [name]., MRN:  [id number], DOB:  1956-04-14, LOS: 21 ADMISSION DATE:  11/28/2018, CONSULTATION DATE: January 3 REFERRING MD: EDP, CHIEF COMPLAINT: Dyspnea  Brief History   63 year old male with influenza A causing respiratory failure and cardiogenic shock.  Prolonged mechanical ventilation, tracheostomy placed on January 16.  Past Medical History  CAD, DM2, HTN, HLD  Significant Hospital Events   1/6 self extubated 1/7 re-intubated 1/8 self extubated 1/9 acute respiratory failure, mucus plugging right lower lobe, re-intubated, bronchoscopy 1/10 New fever T Max 103.1, antibiotics resumed with ceftaz and vancomycin 1/11: Hemodynamically seems to be improving.  Very hypernatremic.  X-ray improving.  Adding free water, one-time Lasix, follow-up chemistry a.m.  Hoping to initiate weaning efforts on 1/12 1/16.  Continuing diuresis and ongoing ventilatory support.  Working on weaning but still has significant work of breathing.  Plan has been to proceed with tracheostomy.  Hopefully we can continue ongoing diuresis and continue to decrease sedation 1/20 still struggling to wean. Secretions and anxiety are barriers.  1/21 trach revision to 6 distal XLT, fever, increased secretions, abx broadened / bronch 1/22 ongoing thick secretions 1/24 Failed SBT, low grade fever, Per CXR persistent pleural effusions / basilar consolidation atelectasis vs pneumonia per bases  Consults:  PCCM Cardiology   Procedures:  ETT 1/3 >>1/6, 1/7 > 1/8> 1/9 >> 1/16 1/5 Right TL PICC >> 1/9 Bronch>> Mucus plugging RLL Trach 1/16  (JY) >> 1/20 trach change failed and #4 cuffed 1/21 trach upsized to 6 cuffed XLT distal  1/17 right nare cortrak >>  Significant Diagnostic Tests:  UDS 1/4 >>negative  ECHO 1/4 >>LVEF 20-25%, akinesis of the anteroseptal, anterolateral & apical myocardium, LVEF 82-95%, grade 1 diastolic dysfunction, small pericardial effusion without evidence of hemodynamic  compromise CT Chest / ABD / Pelvis 1/3 >> airway thickening, motion artifact, CAD CXR 1/14> bilateral opacities, associated small pleural effusion   Micro Data:  RVP 1/3 >> positive for influenza A BCx2 1/3 >>No growth UC 1/4 >>No Growth Sputum Cx 1/7 >> Normal respiratory flora BAL >> corynebacterium  Blood Cx 1/10 > neg Sputum 1/21 >corynebacterium  Antimicrobials:  Vanco 1/3 x1;   1/21 >>  Zosyn 1/30 x1  Tamiflu 1/3 >1/8 1/10 vancomycin>1/12 1/10 ceftaz >1/12;   1/21 >>1/22 1/12 emycin > 1/22 1/23 cefepime >>  Interim history/subjective:  Ongoing agitation.  Started on precedex/ clonidine taper yesterday.  Overnight precedex was increased, therefor clonidine has been increased 1/24  am Ongoing purulent trach ( green secretions) and endotracheal secretions requiring less frequent suctioning.  FiO2 currently at 40% Failed SBT 1/24 am   Tmax 100.3   Objective   Blood pressure (!) 141/69, pulse 100, temperature 98.9 F (37.2 C), temperature source Oral, resp. rate (!) 22, height 6' (1.829 m), weight 96.5 kg, SpO2 97 %. CVP:  [9 mmHg] 9 mmHg  Vent Mode: PRVC FiO2 (%):  [40 %] 40 % Set Rate:  [22 bmp] 22 bmp Vt Set:  [620 mL] 620 mL PEEP:  [5 cmH20] 5 cmH20 Pressure Support:  [5 cmH20] 5 cmH20 Plateau Pressure:  [18 cmH20-20 cmH20] 18 cmH20   Intake/Output Summary (Last 24 hours) at 12/19/2018 1140 Last data filed at 12/19/2018 1009 Gross per 24 hour  Intake 5738.87 ml  Output 3020 ml  Net 2718.87 ml   Filed Weights   12/17/18 0500 12/18/18 0406 12/19/18 0500  Weight: 94.7 kg 96 kg 96.5 kg   Examination:  General:  Adult male on MV in  NAD, family at bedside HEENT: MM pink/moist, midline 6 shiley XLT distal with copious purulent  (green) secretions around trach, right nare cortrak  Neuro: Calm, alert, mouth words, following commands, MAE x 4 CV: S1, S2, SR 60's, rrr, no murmur PULM: Bilateral chest excursion, even/non-labored on full MV support, lungs  bilaterally rhonchi- thick purulent blood tinged, diminished in bases GI: soft, non-tender, ND, bs active, condom cath in place, rectal tube in place Extremities: warm/dry, no LE edema, left BKA, partial removal of right foot Skin: no rashes, sacral decub  Resolved Hospital Problem list   Sedation related hypotension Acute pulmonary edema   Assessment & Plan:  Acute respiratory failure with hypoxemia: Influenza A, healthcare associated pneumonia (corynbacterium in sputum ).  S/p trach 1/16 - Course now complicated by respiratory secretions 1/21 with worsening hypoxia P:  full MV support, PRVC 8 cc/kg, rate 22 Improving FiO2 requirements goal is >92% Hopefully can soon start SBT/ ATC trials when secretions/ increased O2 requirements improve CXR daily duonebs TID, albuterol q 2 prn  Ongoing aggressive pulm toliet-  Chest PT, mobilize as able w/PT to OOB Continue 3% saline nebs Continue vanc, discontinue cefepime as sputum culture + for corynbacterium, but not pseudomonas  Tracheostomy status: Cuff leak 1/21 s/p trach revision P:  No issues; monitor Trach care per protocol    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   Known EF of 20 to 25% with akinesis of the anterior septal anterior lateral and apical myocardium grade 1 diastolic dysfunction - +654/ remains net +17L but does not look hypervolemic on exam  P:  Tele monitoring  Cardiology following, appreciate recommendations; will need LHC when stable Gentle diuresis again today- given Na 149 and thick secretions,  renal function stable Daily ASA, lipitor Blood pressure improving, consider addition of  beta blocker/ ACE/ ARB  Elective cardiac cath warranted if he will allow Goal K > 4, Mag > 2, trend BMET   AKI - adequate UOP, stable sCr today  P:  Continue to monitor UOP/ renal function  Acute metabolic encephalopathy: improving, toxic metabolic encephalopathy/ICU delirium Continue enteral  Seroquel 200 mg BID, klonopin 6m BID, ativan 170mq 8 PRN Clonidine taper (with holding parameters) to wean precedex, started 1/22 Monitor QTc Ongoing thiamine/ folate  Diabetes mellitus with acute hyperglycemia P:  Sliding scale insulin protocol resistant CBG levemir 45 units BID   Constipation s/p frequent stools requiring flexiseal 1/22  P:  1/22 changed scheduled Senokot BID and miralax to PRN  monitor  Hypokalemia>> resolved P:  Trend Mag and Phos Trend BMET  Hypernatremia -free water deficit 3840 P:  Continue Free water 30018m 6hr Trend BMP  Normocytic anemia- likely due to critical illness - stable Hgb trend P:  Trend CBC, no evidence of bleeding   Vomiting - no further epidsodes  - KUB 1/21 neg for ileus P:  Monitor, tolerating TF currently   Best practice:  Diet: TF Pain/Anxiety/Delirium protocol (if indicated): oral anxiolytic agents, wean precedex  VAP protocol (if indicated): yes DVT prophylaxis: lovenox GI prophylaxis: famotidine Glucose control: SSI/ levemir  Mobility: encourage OOB to chair Code Status: full Family Communication:  No family at bedside 1/23 Disposition:  ICU   Labs   CBC: Recent Labs  Lab 12/15/18 0543 12/16/18 0856 12/17/18 0506 12/18/18 0641 12/19/18 0520  WBC 7.7 9.0 10.5 8.1 6.5  NEUTROABS 5.9  --  8.6*  --   --   HGB 9.3* 9.4* 9.9* 9.0* 8.8*  HCT 30.7* 31.3* 33.1* 29.9* 30.3*  MCV 92.2 93.7 93.5 95.5 96.2  PLT 329 353 388 362 382    Basic Metabolic Panel: Recent Labs  Lab 12/13/18 0500 12/14/18 0431  12/15/18 0500 12/16/18 0856 12/17/18 0506 12/18/18 0607 12/19/18 0520  NA 142 144   < > 147* 143 146* 150* 149*  K 3.3* 3.2*   < > 3.7 5.0 3.9 3.4* 3.7  CL 101 102   < > 105 106 106 118* 117*  CO2 30 30   < > _0 GLUCOSE 171* 164*   < > 97 336* 220* 90 165*  BUN 28* 29*   < > 41* 36* 40* 33* 32*  CREATININE 0.82 0.80   < > 0.95 1.09 1.25* 0.99 1.00  CALCIUM 8.2* 8.0*   < > 8.3* 8.0* 8.7*  7.9* 8.2*  MG 1.8 2.0  --  2.2  --  2.4  --  2.5*  PHOS  --   --   --  5.0*  --  5.0* 3.6 4.6  4.6   < > = values in this interval not displayed.   GFR: Estimated Creatinine Clearance: 94.7 mL/min (by C-G formula based on SCr of 1 mg/dL). Recent Labs  Lab 12/16/18 0856 12/17/18 0506 12/18/18 0641 12/19/18 0520  WBC 9.0 10.5 8.1 6.5    Liver Function Tests: Recent Labs  Lab 12/18/18 0607 12/19/18 0520  ALBUMIN 1.4* 1.5*   No results for input(s): LIPASE, AMYLASE in the last 168 hours. No results for input(s): AMMONIA in the last 168 hours.  ABG    Component Value Date/Time   PHART 7.282 (L) 12/07/2018 0349   PCO2ART 50.2 (H) 12/07/2018 0349   PO2ART 59.0 (L) 12/07/2018 0349   HCO3 23.4 12/07/2018 0349   TCO2 25 12/07/2018 0349   ACIDBASEDEF 3.0 (H) 12/07/2018 0349   O2SAT 84.0 12/07/2018 0349     Coagulation Profile: No results for input(s): INR, PROTIME in the last 168 hours.  Cardiac Enzymes: No results for input(s): CKTOTAL, CKMB, CKMBINDEX, TROPONINI in the last 168 hours.  HbA1C: Hgb A1c MFr Bld  Date/Time Value Ref Range Status  03/25/2017 05:59 AM 12.3 (H) 4.8 - 5.6 % Final    Comment:    (NOTE)         Pre-diabetes: 5.7 - 6.4         Diabetes: >6.4         Glycemic control for adults with diabetes: <7.0   12/28/2015 08:50 PM 11.7 (H) 4.8 - 5.6 % Final    Comment:    (NOTE)         Pre-diabetes: 5.7 - 6.4         Diabetes: >6.4         Glycemic control for adults with diabetes: <7.0     CBG: Recent Labs  Lab 12/18/18 1609 12/18/18 1949 12/18/18 2314 12/19/18 0353 12/19/18 0833  GLUCAP 138* 119* 175* 148* 113*     App Critical care time: 35 mins     Magdalen Spatz, , MSN, AGACNP-BC [facility] Pgr: [phone] or if no answer [phone] 12/19/2018, 11:40 AM

## 2018-12-19 NOTE — Progress Notes (Signed)
[facility] ADULT ICU REPLACEMENT PROTOCOL FOR AM LAB REPLACEMENT ONLY  The patient does apply for the [facility] Adult ICU Electrolyte Replacment Protocol based on the criteria listed below:   1. Is GFR >/= 40 ml/min? Yes.    Patient's GFR today is >60 2. Is urine output >/= 0.5 ml/kg/hr for the last 6 hours? Yes.   Patient's UOP is 1.2 ml/kg/hr 3. Is BUN < 60 mg/dL? Yes.    Patient's BUN today is 32 4. Abnormal electrolyte(s): K-3.7 5. Ordered repletion with: per protocol 6. If a panic level lab has been reported, has the CCM MD in charge been notified? Yes.  .   Physician:  Dr. Heywood Bene, [place] Boos 12/19/2018 6:09 AM

## 2018-12-20 ENCOUNTER — Inpatient Hospital Stay (HOSPITAL_COMMUNITY): Payer: Medicaid Other

## 2018-12-20 LAB — GLUCOSE, CAPILLARY
Glucose-Capillary: 102 mg/dL — ABNORMAL HIGH (ref 70–99)
Glucose-Capillary: 100 mg/dL — ABNORMAL HIGH (ref 70–99)
Glucose-Capillary: 173 mg/dL — ABNORMAL HIGH (ref 70–99)
Glucose-Capillary: 177 mg/dL — ABNORMAL HIGH (ref 70–99)
Glucose-Capillary: 148 mg/dL — ABNORMAL HIGH (ref 70–99)
Glucose-Capillary: 171 mg/dL — ABNORMAL HIGH (ref 70–99)

## 2018-12-20 LAB — CBC
HCT: 29.9 % — ABNORMAL LOW (ref 39.0–52.0)
Hemoglobin: 9 g/dL — ABNORMAL LOW (ref 13.0–17.0)
MCH: 28.5 pg (ref 26.0–34.0)
MCHC: 30.1 g/dL (ref 30.0–36.0)
MCV: 94.6 fL (ref 80.0–100.0)
Platelets: 344 10*3/uL (ref 150–400)
RBC: 3.16 MIL/uL — ABNORMAL LOW (ref 4.22–5.81)
RDW: 14.1 % (ref 11.5–15.5)
WBC: 8.7 10*3/uL (ref 4.0–10.5)
nRBC: 0 % (ref 0.0–0.2)

## 2018-12-20 LAB — BASIC METABOLIC PANEL
Anion gap: 10 (ref 5–15)
BUN: 26 mg/dL — ABNORMAL HIGH (ref 6–20)
CO2: 24 mmol/L (ref 22–32)
Calcium: 8.2 mg/dL — ABNORMAL LOW (ref 8.9–10.3)
Chloride: 110 mmol/L (ref 98–111)
Creatinine, Ser: 0.8 mg/dL (ref 0.61–1.24)
GFR calc Af Amer: >60 mL/min (ref >60)
GFR calc non Af Amer: >60 mL/min (ref >60)
Glucose, Bld: 72 mg/dL (ref 70–99)
Potassium: 3.6 mmol/L (ref 3.5–5.1)
Sodium: 144 mmol/L (ref 135–145)

## 2018-12-20 LAB — PHOSPHORUS: Phosphorus: 4.7 mg/dL — ABNORMAL HIGH (ref 2.5–4.6)

## 2018-12-20 LAB — MAGNESIUM: Magnesium: 2.3 mg/dL (ref 1.7–2.4)

## 2018-12-20 LAB — VANCOMYCIN, PEAK: Vancomycin Pk: 38 ug/mL (ref 30–40)

## 2018-12-20 LAB — VANCOMYCIN, TROUGH: Vancomycin Tr: 22 ug/mL (ref 15–20)

## 2018-12-20 MED ORDER — VANCOMYCIN HCL IN DEXTROSE 1-5 GM/200ML-% IV SOLN
1000.0000 mg | Freq: Two times a day (BID) | INTRAVENOUS | Status: DC
  Administered 2018-12-20 – 2018-12-22 (×4): 1000 mg via INTRAVENOUS
  Filled 2018-12-20 (×4): qty 200

## 2018-12-20 MED ORDER — ALTEPLASE 2 MG IJ SOLR
2.0000 mg | Freq: Once | INTRAMUSCULAR | Status: AC
  Administered 2018-12-20: 2 mg

## 2018-12-20 MED ORDER — COLLAGENASE 250 UNIT/GM EX OINT
TOPICAL_OINTMENT | Freq: Every day | CUTANEOUS | Status: DC
  Administered 2018-12-21 – 2018-12-22 (×2): via TOPICAL
  Filled 2018-12-20: qty 30

## 2018-12-20 MED ORDER — FUROSEMIDE 10 MG/ML IJ SOLN
20.0000 mg | Freq: Two times a day (BID) | INTRAMUSCULAR | Status: AC
  Administered 2018-12-20 (×2): 20 mg via INTRAVENOUS
  Filled 2018-12-20 (×2): qty 2

## 2018-12-20 MED ORDER — POTASSIUM CHLORIDE 20 MEQ/15ML (10%) PO SOLN
40.0000 meq | Freq: Two times a day (BID) | ORAL | Status: AC
  Administered 2018-12-20 (×2): 40 meq via ORAL
  Filled 2018-12-20 (×2): qty 30

## 2018-12-20 NOTE — Progress Notes (Signed)
Physical Therapy Wound Treatment Patient Details  Name: Antonio [name]. MRN: [id number] Date of Birth: 09-17-1956  Today's Date: 12/20/2018 Time: 6010-9323 Time Calculation (min): 41 min  Subjective  Subjective: Pt pleasant and agreeable to hydro - mouthing to therapist that he's had pain medicine and is doing "ok" today.  Patient and Family Stated Goals: none stated Date of Onset: (unknown) Prior Treatments: dressing changes  Pain Score: Pt premedicated, however pain limited treatment  Wound Assessment  Pressure Injury 12/01/18 Unstageable - Full thickness tissue loss in which the base of the ulcer is covered by slough (yellow, tan, gray, green or brown) and/or eschar (tan, brown or black) in the wound bed. sacrum (Active)  Dressing Type Gauze (Comment);Moist to dry;ABD;Other (Comment);Barrier Film (skin prep) 12/20/2018 10:16 AM  Dressing Clean;Dry;Intact;Changed 12/20/2018 10:16 AM  Dressing Change Frequency Daily 12/20/2018 10:16 AM  State of Healing Non-healing 12/20/2018 10:16 AM  Site / Wound Assessment Red;Yellow 12/20/2018 10:16 AM  % Wound base Red or Granulating 5% 12/20/2018 10:16 AM  % Wound base Yellow/Fibrinous Exudate 95% 12/20/2018 10:16 AM  % Wound base Black/Eschar 0% 12/20/2018 10:16 AM  % Wound base Other/Granulation Tissue (Comment) 0% 12/20/2018 10:16 AM  Peri-wound Assessment Purple;Excoriated;Erythema (blanchable) 12/20/2018 10:16 AM  Wound Length (cm) 8.5 cm 12/18/2018  9:18 AM  Wound Width (cm) 4 cm 12/18/2018  9:18 AM  Wound Depth (cm) 0.1 cm 12/18/2018  9:18 AM  Wound Surface Area (cm^2) 34 cm^2 12/18/2018  9:18 AM  Wound Volume (cm^3) 3.4 cm^3 12/18/2018  9:18 AM  Tunneling (cm) 2.5cm at 6:oo on clock 12/11/2018  8:00 PM  Margins Unattached edges (unapproximated) 12/20/2018 10:16 AM  Drainage Amount Minimal 12/20/2018 10:16 AM  Drainage Description Serosanguineous 12/20/2018 10:16 AM  Treatment Debridement (Selective);Hydrotherapy (Pulse lavage);Packing (Saline  gauze) 12/20/2018 10:16 AM  Santyl applied to wound bed prior to applying dressing.   Hydrotherapy Pulsed lavage therapy - wound location: sacrum Pulsed Lavage with Suction (psi): 8 psi Pulsed Lavage with Suction - Normal Saline Used: 1000 mL Pulsed Lavage Tip: Tip with splash shield Selective Debridement Selective Debridement - Location: sacrum Selective Debridement - Tools Used: Forceps;Scalpel Selective Debridement - Tissue Removed: yellow unviable tissue   Wound Assessment and Plan  Wound Therapy - Assess/Plan/Recommendations Wound Therapy - Clinical Statement: Progressing with debridement and tissue continues to soften. Pt's pain and discomfort with positioning with vent limiting amount that we were able to debride at this time. This patient will benefit from continued hydrotherapy for continued removal of unviable tissue, to decrease bioburden and promote wound bed healing.  Wound Therapy - Functional Problem List: decr mobility Factors Delaying/Impairing Wound Healing: Diabetes Mellitus;Immobility;Multiple medical problems Hydrotherapy Plan: Debridement;Dressing change;Patient/family education;Pulsatile lavage with suction Wound Therapy - Frequency: 6X / week Wound Therapy - Follow Up Recommendations: Other (comment)(CIR) Wound Plan: see above  Wound Therapy Goals- Improve the function of patient's integumentary system by progressing the wound(s) through the phases of wound healing (inflammation - proliferation - remodeling) by: Decrease Necrotic Tissue to: 70 Decrease Necrotic Tissue - Progress: Progressing toward goal Increase Granulation Tissue to: 30 Increase Granulation Tissue - Progress: Progressing toward goal Goals/treatment plan/discharge plan were made with and agreed upon by patient/family: Yes Time For Goal Achievement: 7 days Wound Therapy - Potential for Goals: Good  Goals will be updated until maximal potential achieved or discharge criteria met.  Discharge  criteria: when goals achieved, discharge from hospital, MD decision/surgical intervention, no progress towards goals, refusal/missing three consecutive treatments without notification or medical  reason.  GP     Thelma Comp 12/20/2018, 10:24 AM   Rolinda Roan, PT, DPT Acute Rehabilitation Services Pager: [phone] Office: [phone]

## 2018-12-20 NOTE — Progress Notes (Signed)
Progress Note  Patient Name: Antonio [name]. Date of Encounter: 12/20/2018  Primary Cardiologist: Minus Breeding, MD   Subjective   Not tolerating pressure support   Inpatient Medications    Scheduled Meds: . aspirin  81 mg Per Tube Daily  . atorvastatin  40 mg Oral q1800  . bethanechol  10 mg Per Tube QID  . chlorhexidine gluconate (MEDLINE KIT)  15 mL Mouth Rinse BID  . Chlorhexidine Gluconate Cloth  6 each Topical Daily  . clonazePAM  1 mg Per Tube BID  . cloNIDine  0.1 mg Per Tube Q6H  . enoxaparin (LOVENOX) injection  40 mg Subcutaneous Daily  . famotidine  20 mg Per Tube BID  . feeding supplement (PRO-STAT SUGAR FREE 64)  60 mL Per Tube BID  . fentaNYL  75 mcg Transdermal Q72H  . folic acid  1 mg Per Tube Daily  . free water  300 mL Per Tube Q6H  . furosemide  20 mg Intravenous BID  . insulin aspart  0-20 Units Subcutaneous Q4H  . insulin detemir  45 Units Subcutaneous BID  . ipratropium-albuterol  3 mL Nebulization TID  . losartan  25 mg Oral Daily  . mouth rinse  15 mL Mouth Rinse 10 times per day  . multivitamin  15 mL Oral Daily  . potassium chloride  40 mEq Oral BID  . QUEtiapine  200 mg Per Tube BID  . sodium chloride flush  10-40 mL Intracatheter Q12H  . sodium chloride HYPERTONIC  4 mL Nebulization Daily  . thiamine  100 mg Per Tube Daily   Continuous Infusions: . sodium chloride 5 mL/hr at 12/19/18 1726  . dexmedetomidine 0.3 mcg/kg/hr (12/20/18 0617)  . feeding supplement (VITAL 1.5 CAL) 1,000 mL (12/20/18 0537)  . vancomycin 250 mL/hr at 12/20/18 0545   PRN Meds: sodium chloride, acetaminophen (TYLENOL) oral liquid 160 mg/5 mL, albuterol, bisacodyl, docusate, fentaNYL (SUBLIMAZE) injection, ibuprofen, LORazepam, polyethylene glycol, sodium chloride flush   Vital Signs    Vitals:   12/20/18 0700 12/20/18 0757 12/20/18 0800 12/20/18 0900  BP: 132/79 132/77 138/73 (!) 117/100  Pulse: 80 87 82 (!) 108  Resp: (!) 22 (!) 22 18   Temp:    98.6 F (37 C)   TempSrc:   Oral   SpO2: 95% 96% 96% 93%  Weight:      Height:        Intake/Output Summary (Last 24 hours) at 12/20/2018 0929 Last data filed at 12/20/2018 0900 Gross per 24 hour  Intake 821.83 ml  Output 1350 ml  Net -528.17 ml   Last 3 Weights 12/19/2018 12/18/2018 12/17/2018  Weight (lbs) 212 lb 11.9 oz 211 lb 10.3 oz 208 lb 12.4 oz  Weight (kg) 96.5 kg 96 kg 94.7 kg      Telemetry    NSR currently 8 beat run of NSVT 1/23 - Personally Reviewed  ECG    Not performed today - Personally Reviewed  Physical Exam   Affect appropriate Chronically ill white male  HEENT: feeding tube  Neck supple with no adenopathy JVP normal no bruits no thyromegaly Lungs rhonchi no wheezing and good diaphragmatic motion Heart:  S1/S2 no murmur, no rub, gallop or click PMI normal Abdomen: rectal tube  no bruit.  No HSM or HJR Distal pulses intact with no bruits No edema Neuro non-focal Skin warm and dry Left BKA   Labs    Chemistry Recent Labs  Lab 12/18/18 0607 12/19/18 0520 12/20/18 0500  NA 150* 149* 144  K 3.4* 3.7 3.6  CL 118* 117* 110  CO2 _0 GLUCOSE 90 165* 72  BUN 33* 32* 26*  CREATININE 0.99 1.00 0.80  CALCIUM 7.9* 8.2* 8.2*  ALBUMIN 1.4* 1.5*  --   GFRNONAA >60 >60 >60  GFRAA >60 >60 >60  ANIONGAP _1 Hematology Recent Labs  Lab 12/18/18 0641 12/19/18 0520 12/20/18 0500  WBC 8.1 6.5 8.7  RBC 3.13* 3.15* 3.16*  HGB 9.0* 8.8* 9.0*  HCT 29.9* 30.3* 29.9*  MCV 95.5 96.2 94.6  MCH 28.8 27.9 28.5  MCHC 30.1 29.0* 30.1  RDW 13.7 14.0 14.1  PLT 362 354 344    Cardiac EnzymesNo results for input(s): TROPONINI in the last 168 hours. No results for input(s): TROPIPOC in the last 168 hours.   BNP Recent Labs  Lab 12/17/18 0506  BNP 48.7     DDimer No results for input(s): DDIMER in the last 168 hours.   Radiology    Dg Chest Port 1 View  Result Date: 12/20/2018 CLINICAL DATA:  Acute respiratory failure EXAM:  PORTABLE CHEST 1 VIEW COMPARISON:  12/19/2018 FINDINGS: The tracheostomy tube tip is above the carina. There is a right IJ catheter with tip projecting over the right atrium. The tip of the enteric tube is below the level of the GE junction. Normal heart size. Pleural effusions appear decreased from previous exam. Pulmonary vascular congestion, improved. IMPRESSION: 1. Decrease in bilateral pleural effusions and pulmonary vascular congestion 2. Stable support apparatus.  The Electronically Signed   By: Kerby Moors M.D.   On: 12/20/2018 07:11   Dg Chest Port 1 View  Result Date: 12/19/2018 CLINICAL DATA:  Respiratory failure. EXAM: PORTABLE CHEST 1 VIEW COMPARISON:  December 18, 2018. FINDINGS: Endotracheal tube tip is 6.3 cm above the carina. Feeding tube tip is below the diaphragm. Central catheter tip is in the right atrium, stable. No pneumothorax. There are pleural effusions bilaterally with areas of consolidation in both bases. Upper lung zones are clear. Heart is mildly enlarged with pulmonary vascularity within normal limits. No adenopathy. No bone lesions. IMPRESSION: And catheter positions as described without pneumothorax. Persistent pleural effusions with areas of bibasilar consolidation. Concern for potential pneumonia in the bases with associated atelectasis. No new opacity evident. Stable cardiac silhouette. Electronically Signed   By: Lowella Grip III M.D.   On: 12/19/2018 07:12   Dg Chest Port 1 View  Result Date: 12/18/2018 CLINICAL DATA:  Patient on ventilator.  Respiratory failure EXAM: PORTABLE CHEST 1 VIEW COMPARISON:  December 17, 2018 FINDINGS: The tracheostomy tube and right PICC line remain in place. The feeding tube is not well seen distally due to poor penetration. There appear to be small bilateral layering effusions with underlying atelectasis. The cardiomediastinal silhouette is stable. No other abnormalities. IMPRESSION: Suggested small layering pleural effusions with  underlying opacities. No other interval changes. Electronically Signed   By: Dorise Bullion III M.D   On: 12/18/2018 10:57    Cardiac Studies   Echo 11/29/2018 Study Conclusions - Left ventricle: The cavity size was normal. Wall thickness was normal. Systolic function was severely reduced. The estimated ejection fraction was in the range of 20% to 25%. There is akinesis of the anteroseptal, anterolateral, and apical myocardium. Doppler parameters are consistent with abnormal left ventricular relaxation (grade 1 diastolic dysfunction). - Pericardium, extracardiac: A small pericardial effusion was identified circumferential to the heart. The fluid exhibited a fibrinous appearance.There  was no evidence of hemodynamic compromise.  Patient Profile     64 y.o.malewith PMH of CAD, ICM with previous baseline of 40%, HTN, HLD and DM II who presented01/03with acute respiratory failure due to influenza. EF was 20-25% on echo. He was intubated and treated for cardiogenic shock and respiratory failure>>trach 01/16. Pending left and right heartcathoncemore medically stable.  Assessment & Plan   1. Acute Respiratory Failure: in the setting of influenza, complicated by corynebacterium PNA and pulmonary edema. S/p trach 1/16w/ revision 1/21. Vent management per PCCM. Not tolerating pressure support for any length of time   2. Cardiomyopathy/ Acute on Chronic Systolic CHF: previous EF was 40%. New EF now 20-25%. He will need R/L cardiac cath for further w/u to exclude obstructive CAD, but prefer to do once more medically stable. continue diuresis   3. AKI: resolved Cr 0.80 today   4. NSVT: 8 beat run noted 1/23 . No recurrence   This is in the setting of cardiomyopathy w/ EF at 20-25%. K WNL today. Currently not on  blocker therapy for reasons outlined above. Will need to continue to monitor on tele. If frequent recurrence, check Mg level. Keep electrolytes stable.   5.  Access:  PIC line clotted nurse made iv team aware    For questions or updates, please contact [place] Please consult [email or web address] for contact info under        Signed, Jenkins Rouge, MD  12/20/2018, 9:29 AM    History and all data above reviewed.  Patient examined.  I agree with the findings as above.  No chest pain.  Complains of buttocks pain.  The patient exam reveals COR:RRR  ,  Lungs: Clear  ,  Abd: Positive bowel sounds, no rebound no guarding, Ext No edema  .  All available labs, radiology testing, previous records reviewed. Agree with documented assessment and plan.  Looks like BP will now allow med titration.  I will start a low dose of ARB.  Clonidine should be off this weekend.  Likely will be able to titrate meds further.    Jenkins Rouge  9:29 AM  12/20/2018

## 2018-12-20 NOTE — Progress Notes (Signed)
NAME:  Antonio Montagna., MRN:  [id number], DOB:  09-25-1956, LOS: 24 ADMISSION DATE:  11/28/2018, CONSULTATION DATE: January 3 REFERRING MD: EDP, CHIEF COMPLAINT: Dyspnea  Brief History   63 year old male with influenza A causing respiratory failure and cardiogenic shock.  Prolonged mechanical ventilation, tracheostomy placed on January 16.  Past Medical History  CAD, DM2, HTN, HLD  Significant Hospital Events   1/6 self extubated 1/7 re-intubated 1/8 self extubated 1/9 acute respiratory failure, mucus plugging right lower lobe, re-intubated, bronchoscopy 1/10 New fever T Max 103.1, antibiotics resumed with ceftaz and vancomycin 1/11: Hemodynamically seems to be improving.  Very hypernatremic.  X-ray improving.  Adding free water, one-time Lasix, follow-up chemistry a.m.  Hoping to initiate weaning efforts on 1/12 1/16.  Continuing diuresis and ongoing ventilatory support.  Working on weaning but still has significant work of breathing.  Plan has been to proceed with tracheostomy.  Hopefully we can continue ongoing diuresis and continue to decrease sedation 1/20 still struggling to wean. Secretions and anxiety are barriers.  1/21 trach revision to 6 distal XLT, fever, increased secretions, abx broadened / bronch 1/22 ongoing thick secretions 1/24 Failed SBT, low grade fever, Per CXR persistent pleural effusions / basilar consolidation atelectasis vs pneumonia per bases  Consults:  PCCM Cardiology   Procedures:  ETT 1/3 >>1/6, 1/7 > 1/8> 1/9 >> 1/16 1/5 Right TL PICC >> 1/9 Bronch>> Mucus plugging RLL Trach 1/16  (JY) >> 1/20 trach change failed and #4 cuffed 1/21 trach upsized to 6 cuffed XLT distal  1/17 right nare cortrak >>  Significant Diagnostic Tests:  UDS 1/4 >>negative  ECHO 1/4 >>LVEF 20-25%, akinesis of the anteroseptal, anterolateral & apical myocardium, LVEF 76-19%, grade 1 diastolic dysfunction, small pericardial effusion without evidence of hemodynamic  compromise CT Chest / ABD / Pelvis 1/3 >> airway thickening, motion artifact, CAD CXR 1/14> bilateral opacities, associated small pleural effusion   Micro Data:  RVP 1/3 >> positive for influenza A BCx2 1/3 >>No growth UC 1/4 >>No Growth Sputum Cx 1/7 >> Normal respiratory flora BAL >> corynebacterium  Blood Cx 1/10 > neg Sputum 1/21 > abundant corynebacterium striatum  Antimicrobials:  Vanco 1/3 x1;   1/21 >>  Zosyn 1/30 x1  Tamiflu 1/3 >1/8 1/10 vancomycin>1/12 1/10 ceftaz >1/12;   1/21 >>1/22 1/12 emycin > 1/22 1/23 cefepime >>  Interim history/subjective:  Ongoing agitation.  Precedex weaned to 0.3 mcg/kg/hr   Ongoing purulent trach ( green secretions)   Fewer endotracheal secretions requiring less frequent suctioning.   FiO2 currently 40%, he is weaning on 10/5 this morning CXR 1/25 reviewed by me:  decrease in bilateral pleural effusions and PV congestion Tmax 100.5 Remains + 15.2 L QTc .45    Objective   Blood pressure 132/77, pulse 87, temperature 99.2 F (37.3 C), temperature source Oral, resp. rate (!) 22, height 6' (1.829 m), weight 96.5 kg, SpO2 96 %. CVP:  [10 mmHg-11 mmHg] 11 mmHg  Vent Mode: CPAP;PSV FiO2 (%):  [40 %] 40 % Set Rate:  [22 bmp] 22 bmp Vt Set:  [620 mL] 620 mL PEEP:  [5 cmH20] 5 cmH20 Pressure Support:  [10 cmH20] 10 cmH20 Plateau Pressure:  [18 cmH20-19 cmH20] 18 cmH20   Intake/Output Summary (Last 24 hours) at 12/20/2018 0806 Last data filed at 12/20/2018 0617 Gross per 24 hour  Intake 761.77 ml  Output 1525 ml  Net -763.23 ml   Filed Weights   12/17/18 0500 12/18/18 0406 12/19/18 0500  Weight: 94.7 kg  96 kg 96.5 kg   Examination:  General:  Adult male on MV in NAD, remains on precedex at 0.3 HEENT: MM pink/moist, midline 6 shiley XLT distal with scant  purulent  (green) secretions around trach, right nare cortrak, some redness to trach site noted  Neuro: Calm, alert, mouthing words, nodding head, following commands, MAE x  4 CV: S1, S2, SR 60's, rrr, no murmur PULM: Bilateral chest excursion, even/non-labored on 10/5, lungs bilaterally with scattered rhonchi- thick purulent blood tinged secretions, diminished per bases GI: soft, non-tender, ND, bs active, condom cath in place, rectal tube in place Extremities: warm/dry, no LE edema, left BKA, partial removal of right foot Skin: no rashes, no lesions ,sacral decub  Resolved Hospital Problem list   Sedation related hypotension Acute pulmonary edema   Assessment & Plan:  Acute respiratory failure with hypoxemia: Influenza A, healthcare associated pneumonia (corynbacterium in sputum ).  S/p trach 1/16 - Course now complicated by respiratory secretions 1/21 with worsening hypoxia P:  MV with SBT as able, goal of ATC trials as oxygen needs improve Wean oxygen and PEEP as able Improving FiO2 requirements goal is >92% CXR daily duonebs TID, albuterol q 2 prn  Ongoing aggressive pulm toliet-  Chest PT, mobilize as able w/PT to OOB Continue 3% saline nebs Continue vanc, discontinue cefepime 1/24  as sputum culture + for corynbacterium, but not pseudomonas  Tracheostomy status: Cuff leak 1/21 s/p trach revision Scant green secretions notes P:  No issues; monitor Trach care per protocol    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   Known EF of 20 to 25% with akinesis of the anterior septal anterior lateral and apical myocardium grade 1 diastolic dysfunction - neg 674 cc last 24--remains net +15.2L but does not look hypervolemic on exam  - 8 beat run 1/23 No recurrence since.  P:  Tele monitoring  Cardiology following, appreciate recommendations; will need LHC when stable Gentle diuresis again today- given Na 149 and thick secretions,  renal function stable Daily ASA, lipitor Blood pressure improving, consider addition of  beta blocker once clonidine weaned and d/c'd Elective cardiac cath warranted if he will allow Goal K > 4,  Mag > 2, trend BMET   AKI - adequate UOP,improving  sCr 1/25  P:  Continue to monitor UOP/ renal function  Acute metabolic encephalopathy: improving, toxic metabolic encephalopathy/ICU delirium Continue enteral Seroquel 200 mg BID, klonopin 45m BID, ativan 165mq 8 PRN Clonidine taper (with holding parameters) to wean precedex, started 1/22 Monitor QTc Ongoing thiamine/ folate Lights on during the day, off at night Frequent re-orientation  Diabetes mellitus with acute hyperglycemia P:  Sliding scale insulin protocol resistant CBG levemir 45 units BID   Constipation s/p frequent stools requiring flexiseal 1/22  P:  1/22 changed scheduled Senokot BID and miralax to PRN  monitor  Hypokalemia Goal K > 4 and Mag > 2.0 P:  Trend Mag and Phos Trend BMET Replete as needed  Hypernatremia -free water deficit 1700 cc's P:  Continue Free water 30025m 6hr Trend BMP  Normocytic anemia- likely due to critical illness - stable Hgb trend P:  Trend CBC, no evidence of bleeding   Vomiting - no further epidsodes  - KUB 1/21 neg for ileus P:  Monitor, tolerating TF currently   Best practice:  Diet: TF Pain/Anxiety/Delirium protocol (if indicated): oral anxiolytic agents, wean precedex  VAP protocol (if indicated): yes DVT prophylaxis: lovenox GI prophylaxis: famotidine Glucose control: SSI/ levemir  Mobility: encourage OOB to chair Code Status: full Family Communication:  No family at bedside 1/25 Disposition:  ICU   Labs   CBC: Recent Labs  Lab 12/15/18 0543 12/16/18 0856 12/17/18 0506 12/18/18 0641 12/19/18 0520 12/20/18 0500  WBC 7.7 9.0 10.5 8.1 6.5 8.7  NEUTROABS 5.9  --  8.6*  --   --   --   HGB 9.3* 9.4* 9.9* 9.0* 8.8* 9.0*  HCT 30.7* 31.3* 33.1* 29.9* 30.3* 29.9*  MCV 92.2 93.7 93.5 95.5 96.2 94.6  PLT 329 353 388 362 354 161    Basic Metabolic Panel: Recent Labs  Lab 12/14/18 0431  12/15/18 0500 12/16/18 0856 12/17/18 0506 12/18/18 0607  12/19/18 0520 12/20/18 0500  NA 144   < > 147* 143 146* 150* 149* 144  K 3.2*   < > 3.7 5.0 3.9 3.4* 3.7 3.6  CL 102   < > 105 106 106 118* 117* 110  CO2 30   < > _0 GLUCOSE 164*   < > 97 336* 220* 90 165* 72  BUN 29*   < > 41* 36* 40* 33* 32* 26*  CREATININE 0.80   < > 0.95 1.09 1.25* 0.99 1.00 0.80  CALCIUM 8.0*   < > 8.3* 8.0* 8.7* 7.9* 8.2* 8.2*  MG 2.0  --  2.2  --  2.4  --  2.5* 2.3  PHOS  --   --  5.0*  --  5.0* 3.6 4.6  4.6 4.7*   < > = values in this interval not displayed.   GFR: Estimated Creatinine Clearance: 118.3 mL/min (by C-G formula based on SCr of 0.8 mg/dL). Recent Labs  Lab 12/17/18 0506 12/18/18 0641 12/19/18 0520 12/20/18 0500  WBC 10.5 8.1 6.5 8.7    Liver Function Tests: Recent Labs  Lab 12/18/18 0607 12/19/18 0520  ALBUMIN 1.4* 1.5*   No results for input(s): LIPASE, AMYLASE in the last 168 hours. No results for input(s): AMMONIA in the last 168 hours.  ABG    Component Value Date/Time   PHART 7.282 (L) 12/07/2018 0349   PCO2ART 50.2 (H) 12/07/2018 0349   PO2ART 59.0 (L) 12/07/2018 0349   HCO3 23.4 12/07/2018 0349   TCO2 25 12/07/2018 0349   ACIDBASEDEF 3.0 (H) 12/07/2018 0349   O2SAT 84.0 12/07/2018 0349     Coagulation Profile: No results for input(s): INR, PROTIME in the last 168 hours.  Cardiac Enzymes: No results for input(s): CKTOTAL, CKMB, CKMBINDEX, TROPONINI in the last 168 hours.  HbA1C: Hgb A1c MFr Bld  Date/Time Value Ref Range Status  03/25/2017 05:59 AM 12.3 (H) 4.8 - 5.6 % Final    Comment:    (NOTE)         Pre-diabetes: 5.7 - 6.4         Diabetes: >6.4         Glycemic control for adults with diabetes: <7.0   12/28/2015 08:50 PM 11.7 (H) 4.8 - 5.6 % Final    Comment:    (NOTE)         Pre-diabetes: 5.7 - 6.4         Diabetes: >6.4         Glycemic control for adults with diabetes: <7.0     CBG: Recent Labs  Lab 12/19/18 1146 12/19/18 1509 12/19/18 1936 12/19/18 2326 12/20/18 0322   GLUCAP 126* 120* 128* 170* 102*     App Critical care time: 35 mins  Magdalen Spatz, , MSN, AGACNP-BC [facility] Pgr: [phone] or if no answer [phone] 12/20/2018, 8:06 AM

## 2018-12-20 NOTE — Progress Notes (Signed)
Brief Progress Note RN states PICC is clotted, unable to use or draw labs Will consult  IV team to evaluate for de-clotting  Bevelyn Ngo, AGACNP-BC [facility] Pulmonary/Critical Care Medicine Pager # [phone] After 3pm  Call [phone]  12/20/2018 9:20 AM

## 2018-12-20 NOTE — Progress Notes (Signed)
Pharmacy Antibiotic Note  Antonio [name]. is a 63 y.o. male admitted on 11/28/2018 with influenza A, respiratory failure, and cardiogenic shock with prolonged mechanical ventilation and tracheostomy. Patient has been difficult to wean from vent due to increased secretions requiring trach revision today. Patient was noted to have a new fever concerning for new pneumonia.  Pharmacy has been consulted for Vancomycin dosing.   Vancomycin Peak 46.7 (calculated), trough level 22 (measured). Current AUC 780. Goal AUC is ~500.   Plan: Adjust vancomycin to 1000 mg IV q12h with next dose due at 2200 for projected AUC of 520   Height: 6' (182.9 cm) Weight: 212 lb 11.9 oz (96.5 kg) IBW/kg (Calculated) : 77.6  Temp (24hrs), Avg:98.9 F (37.2 C), Min:98.3 F (36.8 C), Max:99.2 F (37.3 C)  Recent Labs  Lab 12/16/18 0856 12/17/18 0506 12/18/18 0607 12/18/18 0641 12/19/18 0520 12/19/18 2125 12/20/18 0500 12/20/18 0929 12/20/18 1646  WBC 9.0 10.5  --  8.1 6.5  --  8.7  --   --   CREATININE 1.09 1.25* 0.99  --  1.00  --  0.80  --   --   VANCOTROUGH  --   --   --   --   --   --   --   --  22*  VANCOPEAK  --   --   --   --   --  35  --  38  --     Estimated Creatinine Clearance: 118.3 mL/min (by C-G formula based on SCr of 0.8 mg/dL).    Allergies  Allergen Reactions  . Iodinated Diagnostic Agents Hives, Rash and Other (See Comments)    Blisters Staph Blisters / staph  . Penicillins Other (See Comments)    UNSPECIFIED REACTION  Has patient had a PCN reaction causing immediate rash, facial/tongue/throat swelling, SOB or lightheadedness with hypotension: No Has patient had a PCN reaction causing severe rash involving mucus membranes or skin necrosis: No Has patient had a PCN reaction that required hospitalization: No Has patient had a PCN reaction occurring within the last 10 years: No If all of the above answers are "NO", then may proceed with Cephalosporin use.    Antimicrobials  this admission: 1/10 vancomycin >1/12; 1/21 >> 1/10 ceftazidime >1/12; 1/21 >> 1/12 eryth >> (1/22)  Dose adjustments this admission: Decrease to 1000 mg IV q12h   Microbiology results: 1/21 Tracheal aspirate >> 1/9 resp cx:mod diphtheroids (corynebacterium) 1/10 blood cx:  negF 1/9 resp cx: NF 1/7 resp cx: NF 1/3 blood cx:neg 1/4 urine cx: neg 1/4 mrsa pcr: neg 1/3 RVP: +flu A  Neythan Kozlov A. Jeanella Craze, PharmD, BCPS Clinical Pharmacist [place] Pager: [phone] Please utilize Amion for appropriate phone number to reach the unit pharmacist [facility])   12/20/2018 6:00 PM

## 2018-12-20 NOTE — Progress Notes (Signed)
RT called due to pt desaturation and pt had been on vent wean.  Upon entering room RNs and NP at bedside. Pt had been placed back on full support and suctioned several times.  PT's sat were low to mid 80s.  Pt given 100% FIO2 due to low sats in the 80s.  Pt left on 100% FIo2 due to O2 sats staying in upper 80s.  RT given prn neb per NP request.  Sats 92% on 100.  Pt is on full vent support.  RT will continue to monitor.

## 2018-12-21 ENCOUNTER — Inpatient Hospital Stay (HOSPITAL_COMMUNITY): Payer: Medicaid Other

## 2018-12-21 LAB — GLUCOSE, CAPILLARY
GLUCOSE-CAPILLARY: 122 mg/dL — AB (ref 70–99)
Glucose-Capillary: 146 mg/dL — ABNORMAL HIGH (ref 70–99)
Glucose-Capillary: 159 mg/dL — ABNORMAL HIGH (ref 70–99)
Glucose-Capillary: 161 mg/dL — ABNORMAL HIGH (ref 70–99)
Glucose-Capillary: 173 mg/dL — ABNORMAL HIGH (ref 70–99)
Glucose-Capillary: 248 mg/dL — ABNORMAL HIGH (ref 70–99)

## 2018-12-21 LAB — BASIC METABOLIC PANEL
Anion gap: 8 (ref 5–15)
BUN: 24 mg/dL — ABNORMAL HIGH (ref 6–20)
CHLORIDE: 110 mmol/L (ref 98–111)
CO2: 26 mmol/L (ref 22–32)
CREATININE: 0.83 mg/dL (ref 0.61–1.24)
Calcium: 8.3 mg/dL — ABNORMAL LOW (ref 8.9–10.3)
GFR calc Af Amer: >60 mL/min (ref >60)
GFR calc non Af Amer: >60 mL/min (ref >60)
Glucose, Bld: 140 mg/dL — ABNORMAL HIGH (ref 70–99)
Potassium: 4 mmol/L (ref 3.5–5.1)
Sodium: 144 mmol/L (ref 135–145)

## 2018-12-21 LAB — CBC
HCT: 29.1 % — ABNORMAL LOW (ref 39.0–52.0)
Hemoglobin: 8.8 g/dL — ABNORMAL LOW (ref 13.0–17.0)
MCH: 28.5 pg (ref 26.0–34.0)
MCHC: 30.2 g/dL (ref 30.0–36.0)
MCV: 94.2 fL (ref 80.0–100.0)
Platelets: 345 10*3/uL (ref 150–400)
RBC: 3.09 MIL/uL — ABNORMAL LOW (ref 4.22–5.81)
RDW: 13.9 % (ref 11.5–15.5)
WBC: 7.7 10*3/uL (ref 4.0–10.5)
nRBC: 0 % (ref 0.0–0.2)

## 2018-12-21 LAB — MAGNESIUM: Magnesium: 2.3 mg/dL (ref 1.7–2.4)

## 2018-12-21 LAB — PHOSPHORUS: PHOSPHORUS: 4.6 mg/dL (ref 2.5–4.6)

## 2018-12-21 MED ORDER — FUROSEMIDE 10 MG/ML IJ SOLN
20.0000 mg | Freq: Two times a day (BID) | INTRAMUSCULAR | Status: AC
  Administered 2018-12-21 (×2): 20 mg via INTRAVENOUS
  Filled 2018-12-21 (×2): qty 2

## 2018-12-21 MED ORDER — POTASSIUM CHLORIDE 20 MEQ/15ML (10%) PO SOLN
40.0000 meq | Freq: Once | ORAL | Status: AC
  Administered 2018-12-21: 40 meq via ORAL
  Filled 2018-12-21: qty 30

## 2018-12-21 MED ORDER — GUAIFENESIN 100 MG/5ML PO SOLN
5.0000 mL | Freq: Two times a day (BID) | ORAL | Status: DC
  Administered 2018-12-21 – 2019-01-02 (×24): 100 mg
  Administered 2019-01-02: 20 mg
  Administered 2019-01-03 – 2019-02-20 (×98): 100 mg
  Filled 2018-12-21 (×5): qty 5
  Filled 2018-12-21: qty 15
  Filled 2018-12-21 (×13): qty 5
  Filled 2018-12-21: qty 15
  Filled 2018-12-21 (×4): qty 5
  Filled 2018-12-21: qty 15
  Filled 2018-12-21 (×13): qty 5
  Filled 2018-12-21: qty 15
  Filled 2018-12-21 (×5): qty 5
  Filled 2018-12-21: qty 25
  Filled 2018-12-21 (×2): qty 5
  Filled 2018-12-21: qty 25
  Filled 2018-12-21 (×9): qty 5
  Filled 2018-12-21: qty 15
  Filled 2018-12-21 (×7): qty 5
  Filled 2018-12-21: qty 15
  Filled 2018-12-21 (×11): qty 5
  Filled 2018-12-21: qty 15
  Filled 2018-12-21 (×11): qty 5
  Filled 2018-12-21: qty 25
  Filled 2018-12-21 (×5): qty 5
  Filled 2018-12-21: qty 15
  Filled 2018-12-21 (×8): qty 5
  Filled 2018-12-21: qty 15
  Filled 2018-12-21 (×3): qty 5
  Filled 2018-12-21: qty 15
  Filled 2018-12-21 (×3): qty 5
  Filled 2018-12-21: qty 15
  Filled 2018-12-21 (×10): qty 5
  Filled 2018-12-21: qty 15
  Filled 2018-12-21: qty 5

## 2018-12-21 NOTE — Progress Notes (Addendum)
NAME:  Kensley Lares., MRN:  [id number], DOB:  05-02-1956, LOS: 63 ADMISSION DATE:  11/28/2018, CONSULTATION DATE: January 3 REFERRING MD: EDP, CHIEF COMPLAINT: Dyspnea  Brief History   63 year old male with influenza A causing respiratory failure and cardiogenic shock.  Prolonged mechanical ventilation, tracheostomy placed on January 16.  Past Medical History  CAD, DM2, HTN, HLD  Significant Hospital Events   1/6 self extubated 1/7 re-intubated 1/8 self extubated 1/9 acute respiratory failure, mucus plugging right lower lobe, re-intubated, bronchoscopy 1/10 New fever T Max 103.1, antibiotics resumed with ceftaz and vancomycin 1/11: Hemodynamically seems to be improving.  Very hypernatremic.  X-ray improving.  Adding free water, one-time Lasix, follow-up chemistry a.m.  Hoping to initiate weaning efforts on 1/12 1/16.  Continuing diuresis and ongoing ventilatory support.  Working on weaning but still has significant work of breathing.  Plan has been to proceed with tracheostomy.  Hopefully we can continue ongoing diuresis and continue to decrease sedation 1/20 still struggling to wean. Secretions and anxiety are barriers.  1/21 trach revision to 6 distal XLT, fever, increased secretions, abx broadened / bronch 1/22 ongoing thick secretions 1/24 Failed SBT, low grade fever, Per CXR persistent pleural effusions / basilar consolidation atelectasis vs pneumonia per bases 1/25>> Mucus plugged with slow recovery after chest PT and Wound Care  Consults:  PCCM Cardiology   Procedures:  ETT 1/3 >>1/6, 1/7 > 1/8> 1/9 >> 1/16 1/5 Right TL PICC >> 1/9 Bronch>> Mucus plugging RLL Trach 1/16  (JY) >> 1/20 trach change failed and #4 cuffed 1/21 trach upsized to 6 cuffed XLT distal  1/17 right nare cortrak >>  Significant Diagnostic Tests:  UDS 1/4 >>negative  ECHO 1/4 >>LVEF 20-25%, akinesis of the anteroseptal, anterolateral & apical myocardium, LVEF 14-78%, grade 1 diastolic  dysfunction, small pericardial effusion without evidence of hemodynamic compromise CT Chest / ABD / Pelvis 1/3 >> airway thickening, motion artifact, CAD CXR 1/14> bilateral opacities, associated small pleural effusion   Micro Data:  RVP 1/3 >> positive for influenza A BCx2 1/3 >>No growth UC 1/4 >>No Growth Sputum Cx 1/7 >> Normal respiratory flora BAL >> corynebacterium  Blood Cx 1/10 > neg Sputum 1/21 > abundant corynebacterium striatum  Antimicrobials:  Vanco 1/3 x1;   1/21 >>  Zosyn 1/30 x1  Tamiflu 1/3 >1/8 1/10 vancomycin>1/12 1/10 ceftaz >1/12;   1/21 >>1/22 1/12 emycin > 1/22 1/23 cefepime >>  Interim history/subjective:  Ongoing agitation.  Precedex remains  At  0.3 mcg/kg/hr   Ongoing purulent trach secretions( green secretions)   Fewer endotracheal secretions requiring less frequent suctioning.   FiO2 currently 40%, he is weaning on 10/5 this morning again CXR 1/26 reviewed by me: Stable low volume chest with atelectasis vs  infection at the bases.  Tmax 99.1 Remains + 9.4 L QTc .49 on 1/26   Objective   Blood pressure (!) 154/77, pulse (!) 101, temperature 98.8 F (37.1 C), temperature source Oral, resp. rate (!) 23, height 6' (1.829 m), weight 96.5 kg, SpO2 99 %.    Vent Mode: CPAP;PSV FiO2 (%):  [40 %-100 %] 40 % Set Rate:  [22 bmp] 22 bmp Vt Set:  [620 mL] 620 mL PEEP:  [5 cmH20] 5 cmH20 Pressure Support:  [10 cmH20] 10 cmH20 Plateau Pressure:  [18 cmH20-20 cmH20] 19 cmH20   Intake/Output Summary (Last 24 hours) at 12/21/2018 0922 Last data filed at 12/21/2018 0641 Gross per 24 hour  Intake 924.18 ml  Output 2000 ml  Net -  1075.82 ml   Filed Weights   12/17/18 0500 12/18/18 0406 12/19/18 0500  Weight: 94.7 kg 96 kg 96.5 kg   Examination:  General:  Adult male on MV in NAD, remains on precedex at 0.3 HEENT: MM pink/moist, midline 6 shiley XLT distal with scant  purulent  (green) secretions around trach, right nare cortrak, some redness to  trach site noted  Neuro: Calm, alert, mouthing words, nodding head, following commands, MAE x 4 CV: S1, S2, SR 60's, rrr, no murmur PULM: Bilateral chest excursion, even/non-labored on 10/5, lungs bilaterally with scattered rhonchi- thick purulent   secretions, diminished per bases GI: soft, non-tender, ND, bs active, condom cath in place, rectal tube in place Extremities: warm/dry, no LE edema, left BKA, partial removal of right foot Skin: no rashes, no lesions ,sacral decub  Resolved Hospital Problem list   Sedation related hypotension Acute pulmonary edema   Assessment & Plan:  Acute respiratory failure with hypoxemia: Influenza A, healthcare associated pneumonia (corynbacterium in sputum ).  S/p trach 1/16 - Course now complicated by respiratory secretions 1/21 with worsening hypoxia P:  MV with SBT as able, goal of ATC trials as oxygen needs improve Wean oxygen and PEEP as able Improving FiO2 requirements goal is >92% CXR daily duonebs TID, albuterol q 2 prn  Ongoing aggressive pulm toliet-  Chest PT, mobilize as able w/PT to OOB Stop 3% saline nebs Add Mucinex Q 12 per tube Consider Mucomyst if secretions remain and issue Continue vanc, discontinue cefepime 1/24  as sputum culture + for corynbacterium, but not pseudomonas  Tracheostomy status: Cuff leak 1/21now  s/p trach revision Scant green/ tan  secretions notes P:  No issues; monitor Trach care per protocol    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   Known EF of 20 to 25% with akinesis of the anterior septal anterior lateral and apical myocardium grade 1 diastolic dysfunction - neg 674 cc last 24--remains net +15.2L but does not look hypervolemic on exam  - 8 beat run 1/23 No recurrence since.  P:  Tele monitoring  Cardiology following, appreciate recommendations; will need LHC when stable Gentle diuresis again today- given Na 149 and thick secretions,  renal function stable Daily  ASA, lipitor Blood pressure improving, consider addition of  beta blocker once clonidine weaned and d/c'd Elective cardiac cath warranted if he will allow Goal K > 4, Mag > 2, trend BMET   AKI - adequate UOP,improving  sCr 1/26  P:  Continue to monitor UOP/ renal function Avoid nephrotoxic medications  Acute metabolic encephalopathy: improving, toxic metabolic encephalopathy/ICU delirium Continue enteral Seroquel 200 mg BID, klonopin 56m BID, ativan 125mq 8 PRN Clonidine taper (with holding parameters) to wean precedex, started 1/22 Monitor QTc Ongoing thiamine/ folate Lights on during the day, off at night Frequent re-orientation  Diabetes mellitus with acute hyperglycemia P:  Sliding scale insulin protocol resistant CBG levemir 45 units BID   Constipation s/p frequent stools requiring flexiseal 1/22  P:  1/22 changed scheduled Senokot BID and miralax to PRN  monitor  Hypokalemia Goal K > 4 and Mag > 2.0 P:  Trend Mag and Phos Trend BMET Replete as needed  Hypernatremia -free water deficit 1700 cc's P:  Continue Free water 30074m 6hr Trend BMP  Normocytic anemia- likely due to critical illness - stable Hgb trend P:  Trend CBC, no evidence of bleeding   Vomiting - no further epidsodes  - KUB 1/21 neg for ileus P:  Monitor, tolerating TF currently   Best practice:  Diet: TF Pain/Anxiety/Delirium protocol (if indicated): oral anxiolytic agents, wean precedex  VAP protocol (if indicated): yes DVT prophylaxis: lovenox GI prophylaxis: famotidine Glucose control: SSI/ levemir  Mobility: encourage OOB to chair Code Status: full Family Communication:  I have spoken with patient's son by phone and updated him. 1/26 Disposition:  ICU   Labs   CBC: Recent Labs  Lab 12/15/18 0543  12/17/18 0506 12/18/18 0641 12/19/18 0520 12/20/18 0500 12/21/18 0500  WBC 7.7   < > 10.5 8.1 6.5 8.7 7.7  NEUTROABS 5.9  --  8.6*  --   --   --   --   HGB 9.3*   < > 9.9*  9.0* 8.8* 9.0* 8.8*  HCT 30.7*   < > 33.1* 29.9* 30.3* 29.9* 29.1*  MCV 92.2   < > 93.5 95.5 96.2 94.6 94.2  PLT 329   < > 388 362 354 344 345   < > = values in this interval not displayed.    Basic Metabolic Panel: Recent Labs  Lab 12/15/18 0500  12/17/18 0506 12/18/18 0607 12/19/18 0520 12/20/18 0500 12/21/18 0500  NA 147*   < > 146* 150* 149* 144 144  K 3.7   < > 3.9 3.4* 3.7 3.6 4.0  CL 105   < > 106 118* 117* 110 110  CO2 30   < > _0 GLUCOSE 97   < > 220* 90 165* 72 140*  BUN 41*   < > 40* 33* 32* 26* 24*  CREATININE 0.95   < > 1.25* 0.99 1.00 0.80 0.83  CALCIUM 8.3*   < > 8.7* 7.9* 8.2* 8.2* 8.3*  MG 2.2  --  2.4  --  2.5* 2.3 2.3  PHOS 5.0*  --  5.0* 3.6 4.6  4.6 4.7* 4.6   < > = values in this interval not displayed.   GFR: Estimated Creatinine Clearance: 114.1 mL/min (by C-G formula based on SCr of 0.83 mg/dL). Recent Labs  Lab 12/18/18 0641 12/19/18 0520 12/20/18 0500 12/21/18 0500  WBC 8.1 6.5 8.7 7.7    Liver Function Tests: Recent Labs  Lab 12/18/18 0607 12/19/18 0520  ALBUMIN 1.4* 1.5*   No results for input(s): LIPASE, AMYLASE in the last 168 hours. No results for input(s): AMMONIA in the last 168 hours.  ABG    Component Value Date/Time   PHART 7.282 (L) 12/07/2018 0349   PCO2ART 50.2 (H) 12/07/2018 0349   PO2ART 59.0 (L) 12/07/2018 0349   HCO3 23.4 12/07/2018 0349   TCO2 25 12/07/2018 0349   ACIDBASEDEF 3.0 (H) 12/07/2018 0349   O2SAT 84.0 12/07/2018 0349     Coagulation Profile: No results for input(s): INR, PROTIME in the last 168 hours.  Cardiac Enzymes: No results for input(s): CKTOTAL, CKMB, CKMBINDEX, TROPONINI in the last 168 hours.  HbA1C: Hgb A1c MFr Bld  Date/Time Value Ref Range Status  03/25/2017 05:59 AM 12.3 (H) 4.8 - 5.6 % Final    Comment:    (NOTE)         Pre-diabetes: 5.7 - 6.4         Diabetes: >6.4         Glycemic control for adults with diabetes: <7.0   12/28/2015 08:50 PM 11.7 (H) 4.8 -  5.6 % Final    Comment:    (NOTE)         Pre-diabetes: 5.7 - 6.4  Diabetes: >6.4         Glycemic control for adults with diabetes: <7.0     CBG: Recent Labs  Lab 12/20/18 1606 12/20/18 1930 12/20/18 2312 12/21/18 0318 12/21/18 0815  GLUCAP 177* 148* 171* 161* 146*     App Critical care time: 33 mins     Magdalen Spatz, , MSN, AGACNP-BC [facility] Pgr: [phone] or if no answer [phone] 12/21/2018, 9:22 AM

## 2018-12-21 NOTE — Progress Notes (Signed)
Patient pulled out flexi and what appeared to be his intestinal linning came out with it. CCM returned to patient's room to assess content. They stated that it did in fact look like the inner lining of the intestinal tract.  Flexi is to remain out. Rectal pouch was placed in order to prevent further sacral wound breakdown.

## 2018-12-22 ENCOUNTER — Inpatient Hospital Stay (HOSPITAL_COMMUNITY): Payer: Medicaid Other

## 2018-12-22 DIAGNOSIS — I255 Ischemic cardiomyopathy: Secondary | ICD-10-CM

## 2018-12-22 LAB — BASIC METABOLIC PANEL
Anion gap: 7 (ref 5–15)
BUN: 24 mg/dL — ABNORMAL HIGH (ref 6–20)
CHLORIDE: 108 mmol/L (ref 98–111)
CO2: 27 mmol/L (ref 22–32)
Calcium: 8.3 mg/dL — ABNORMAL LOW (ref 8.9–10.3)
Creatinine, Ser: 0.79 mg/dL (ref 0.61–1.24)
GFR calc Af Amer: >60 mL/min (ref >60)
GFR calc non Af Amer: >60 mL/min (ref >60)
Glucose, Bld: 81 mg/dL (ref 70–99)
Potassium: 3.7 mmol/L (ref 3.5–5.1)
Sodium: 142 mmol/L (ref 135–145)

## 2018-12-22 LAB — CBC
HCT: 30.1 % — ABNORMAL LOW (ref 39.0–52.0)
Hemoglobin: 9.3 g/dL — ABNORMAL LOW (ref 13.0–17.0)
MCH: 28.3 pg (ref 26.0–34.0)
MCHC: 30.9 g/dL (ref 30.0–36.0)
MCV: 91.5 fL (ref 80.0–100.0)
Platelets: 355 10*3/uL (ref 150–400)
RBC: 3.29 MIL/uL — ABNORMAL LOW (ref 4.22–5.81)
RDW: 13.9 % (ref 11.5–15.5)
WBC: 10.1 10*3/uL (ref 4.0–10.5)
nRBC: 0 % (ref 0.0–0.2)

## 2018-12-22 LAB — GLUCOSE, CAPILLARY
Glucose-Capillary: 113 mg/dL — ABNORMAL HIGH (ref 70–99)
Glucose-Capillary: 97 mg/dL (ref 70–99)
Glucose-Capillary: 116 mg/dL — ABNORMAL HIGH (ref 70–99)
Glucose-Capillary: 213 mg/dL — ABNORMAL HIGH (ref 70–99)
Glucose-Capillary: 172 mg/dL — ABNORMAL HIGH (ref 70–99)

## 2018-12-22 LAB — MAGNESIUM: Magnesium: 2.2 mg/dL (ref 1.7–2.4)

## 2018-12-22 MED ORDER — ENOXAPARIN SODIUM 40 MG/0.4ML ~~LOC~~ SOLN
40.0000 mg | Freq: Every day | SUBCUTANEOUS | Status: DC
  Administered 2018-12-24 – 2019-01-04 (×12): 40 mg via SUBCUTANEOUS
  Filled 2018-12-22 (×12): qty 0.4

## 2018-12-22 MED ORDER — CLONAZEPAM 0.5 MG PO TABS
0.5000 mg | ORAL_TABLET | Freq: Two times a day (BID) | ORAL | Status: DC
  Administered 2018-12-22 – 2019-01-20 (×58): 0.5 mg
  Filled 2018-12-22 (×58): qty 1

## 2018-12-22 MED ORDER — CLONIDINE HCL 0.1 MG PO TABS
0.1000 mg | ORAL_TABLET | Freq: Two times a day (BID) | ORAL | Status: AC
  Administered 2018-12-23 (×2): 0.1 mg via ORAL
  Filled 2018-12-22 (×2): qty 1

## 2018-12-22 MED ORDER — CLONIDINE HCL 0.1 MG PO TABS
0.1000 mg | ORAL_TABLET | ORAL | Status: AC
  Administered 2018-12-24: 0.1 mg via ORAL
  Filled 2018-12-22: qty 1

## 2018-12-22 MED ORDER — CARVEDILOL 3.125 MG PO TABS
3.1250 mg | ORAL_TABLET | Freq: Two times a day (BID) | ORAL | Status: DC
  Administered 2018-12-22 – 2018-12-25 (×6): 3.125 mg via ORAL
  Filled 2018-12-22 (×6): qty 1

## 2018-12-22 NOTE — Progress Notes (Signed)
Physical Therapy Wound Treatment Patient Details  Name: Antonio [name]. MRN: [id number] Date of Birth: Nov 14, 1956  Today's Date: 12/22/2018 Time: 2952-8413 Time Calculation (min): 34 min  Subjective  Subjective: Pt indicating with hand signals and mouthing that the wound care only hurts a little. The positioning is more uncomfortable. Patient and Family Stated Goals: none stated Date of Onset: (unknown) Prior Treatments: dressing changes  Pain Score: Pain Score: Premedicated. Discomfort with positioning.  Wound Assessment  Pressure Injury 12/01/18 Unstageable - Full thickness tissue loss in which the base of the ulcer is covered by slough (yellow, tan, gray, green or brown) and/or eschar (tan, brown or black) in the wound bed. sacrum (Active)  Dressing Type Gauze (Comment);Moist to dry;ABD;Other (Comment);Barrier Film (skin prep) 12/22/2018  9:19 AM  Dressing Clean;Dry;Intact;Changed 12/22/2018  9:19 AM  Dressing Change Frequency Daily 12/22/2018  9:19 AM  State of Healing Non-healing 12/22/2018  9:19 AM  Site / Wound Assessment Red;Yellow;Pink 12/22/2018  9:19 AM  % Wound base Red or Granulating 5% 12/22/2018  9:19 AM  % Wound base Yellow/Fibrinous Exudate 95% 12/22/2018  9:19 AM  % Wound base Black/Eschar 0% 12/22/2018  9:19 AM  % Wound base Other/Granulation Tissue (Comment) 0% 12/22/2018  9:19 AM  Peri-wound Assessment Purple;Excoriated;Erythema (blanchable) 12/22/2018  9:19 AM  Wound Length (cm) 8.5 cm 12/18/2018  9:18 AM  Wound Width (cm) 4 cm 12/18/2018  9:18 AM  Wound Depth (cm) 0.1 cm 12/18/2018  9:18 AM  Wound Surface Area (cm^2) 34 cm^2 12/18/2018  9:18 AM  Wound Volume (cm^3) 3.4 cm^3 12/18/2018  9:18 AM  Tunneling (cm) 2.5cm at 6:oo on clock 12/11/2018  8:00 PM  Margins Unattached edges (unapproximated) 12/22/2018  9:19 AM  Drainage Amount Minimal 12/22/2018  9:19 AM  Drainage Description Green 12/22/2018  9:19 AM  Treatment Debridement (Selective);Hydrotherapy (Pulse  lavage);Packing (Saline gauze) 12/22/2018  9:19 AM  Santyl applied to wound bed prior to applying dressing.  Hydrotherapy Pulsed lavage therapy - wound location: sacrum Pulsed Lavage with Suction (psi): 8 psi(8-12) Pulsed Lavage with Suction - Normal Saline Used: 1000 mL Pulsed Lavage Tip: Tip with splash shield Selective Debridement Selective Debridement - Location: sacrum Selective Debridement - Tools Used: Forceps;Scalpel Selective Debridement - Tissue Removed: yellow unviable tissue   Wound Assessment and Plan  Wound Therapy - Assess/Plan/Recommendations Wound Therapy - Clinical Statement: Slight blue green tinge to drainage. Will monitor for need to address this drainage. Slow progress with removal of necrotic tissue. Wound Therapy - Functional Problem List: decr mobility Factors Delaying/Impairing Wound Healing: Diabetes Mellitus;Immobility;Multiple medical problems Hydrotherapy Plan: Debridement;Dressing change;Patient/family education;Pulsatile lavage with suction Wound Therapy - Frequency: 6X / week Wound Therapy - Follow Up Recommendations: Other (comment)(LTACH) Wound Plan: see above  Wound Therapy Goals- Improve the function of patient's integumentary system by progressing the wound(s) through the phases of wound healing (inflammation - proliferation - remodeling) by: Decrease Necrotic Tissue to: 70 Decrease Necrotic Tissue - Progress: Progressing toward goal Increase Granulation Tissue to: 30 Increase Granulation Tissue - Progress: Progressing toward goal  Goals will be updated until maximal potential achieved or discharge criteria met.  Discharge criteria: when goals achieved, discharge from hospital, MD decision/surgical intervention, no progress towards goals, refusal/missing three consecutive treatments without notification or medical reason.  GP     Shary [facility] 12/22/2018, 9:53 AM [place] Pager [phone] Office  [phone]

## 2018-12-22 NOTE — Progress Notes (Signed)
NAME:  Antonio [name]., MRN:  [id number], DOB:  12-24-1955, LOS: 8 ADMISSION DATE:  11/28/2018, CONSULTATION DATE:  1/3 REFERRING MD:  EDP, CHIEF COMPLAINT:  Dyspnea   Brief History   63 year old male with influenza A causing respiratory failure and cardiogenic shock.  Prolonged mechanical ventilation, tracheostomy placed on January 16.   Past Medical History  DM2, CAD, HTN  Significant Hospital Events   1/6 self extubated 1/7 re-intubated 1/8 self extubated 1/9 acute respiratory failure, mucus plugging right lower lobe, re-intubated, bronchoscopy 1/10 New fever T Max 103.1, antibiotics resumed with ceftaz and vancomycin 1/11: Hemodynamically seems to be improving. Very hypernatremic. X-ray improving. Adding free water, one-time Lasix, follow-up chemistry a.m. Hoping to initiate weaning efforts on 1/12 1/16. Continuing diuresis and ongoing ventilatory support. Working on weaning but still has significant work of breathing. Plan has been to proceed with tracheostomy. Hopefully we can continue ongoing diuresis and continue to decrease sedation 1/20 still struggling to wean. Secretions and anxiety are barriers.  1/21 trach revision to 6 distal XLT, fever, increased secretions, abx broadened / bronch 1/22 ongoing thick secretions 1/24 Failed SBT, low grade fever, Per CXR persistent pleural effusions / basilar consolidation atelectasis vs pneumonia per bases 1/25>> Mucus plugged with slow recovery after chest PT and Wound Care  Consults:  PCCM Cardiology   Procedures:  ETT 1/3 >>1/6, 1/7 > 1/8> 1/9>> 1/16 1/5 Right TL PICC >> 1/9 Bronch>> Mucus plugging RLL Trach 1/16 (JY) >>1/20 trach change failed and #4 cuffed 1/21 trach upsized to 6 cuffed XLT distal  1/17 right nare cortrak >>  Significant Diagnostic Tests:  UDS 1/4 >>negative  ECHO 1/4 >>LVEF 20-25%, akinesis of the anteroseptal, anterolateral & apical myocardium, LVEF 28-20%, grade 1 diastolic  dysfunction, small pericardial effusion without evidence of hemodynamic compromise CT Chest / ABD / Pelvis 1/3 >> airway thickening, motion artifact, CAD CXR 1/14>bilateral opacities, associated small pleural effusion   Micro Data:  RVP 1/3 >> positive for influenza A BCx2 1/3 >>No growth UC 1/4 >>No Growth Sputum Cx 1/7 >> Normal respiratory flora BAL >>corynebacterium  Blood Cx 1/10 > neg Sputum 1/21 > abundant corynebacterium striatum  Antimicrobials:  Vanco 1/3 x1;    Zosyn 1/30 x1  Tamiflu 1/3 >1/8 1/10 vancomycin>1/12 1/10 ceftaz >1/12;   1/21 >>1/22 1/12 emycin > 1/22 1/23 cefepime >> 1/24 1/21 Vanc>   Interim history/subjective:  Has been working with PT More awake and alert Weaning still limited by thick secretions  Objective   Blood pressure 121/68, pulse 85, temperature 99.4 F (37.4 C), temperature source Oral, resp. rate (!) 22, height 6' (1.829 m), weight 96.5 kg, SpO2 98 %.    Vent Mode: PRVC FiO2 (%):  [40 %] 40 % Set Rate:  [22 bmp] 22 bmp Vt Set:  [620 mL] 620 mL PEEP:  [5 cmH20] 5 cmH20 Pressure Support:  [10 cmH20] 10 cmH20 Plateau Pressure:  [13 cmH20-23 cmH20] 13 cmH20   Intake/Output Summary (Last 24 hours) at 12/22/2018 0947 Last data filed at 12/22/2018 0700 Gross per 24 hour  Intake 2397.94 ml  Output 2475 ml  Net -77.06 ml   Filed Weights   12/17/18 0500 12/18/18 0406 12/19/18 0500  Weight: 94.7 kg 96 kg 96.5 kg    Examination:  General:  In bed on vent HENT: NCAT trach in  PULM: CTA B, vent supported breathing CV: RRR, no mgr GI: BS+, soft, nontender MSK: normal bulk and tone Neuro: awake, alert, interactive with me  Resolved Hospital Problem list   AKI Pneumonia Hypokalemia Hypernatremia   Assessment & Plan:  Acute respiratory failure with hypoxemia: still having problems with thick secretions, to some degree I think this is predominantly the natural history of pneumonia/Influenza and will clear up over the next  few weeks, he has improved significantly overall > daily SBT > pulm toilette measures  Tracheostomy status > trach care per routine  Acute systolic heart failure >repeat echo, suspect it will be better  Nutrition: > needs PEG, will order > continue tube feeding  Acute metabolic encephalopathy : dramatically improved > continue seroquel > wean clonazepam > wean precedex, then wean clonidine  DM2 > SSI/detemir  Constipation > continue bowel regimen (miralax)  Normocytic anemia without bleeding > monitor   Best practice:  Diet: tube feeding Pain/Anxiety/Delirium protocol (if indicated): as above, RASS goal 0 VAP protocol (if indicated): yes DVT prophylaxis: lovenox GI prophylaxis: famoitidine Glucose control: SSI/Detemir Mobility: PT working with him Code Status: Full Family Communication: none bedside Disposition: LTAC  Labs   CBC: Recent Labs  Lab 12/17/18 0506 12/18/18 0641 12/19/18 0520 12/20/18 0500 12/21/18 0500 12/22/18 0446  WBC 10.5 8.1 6.5 8.7 7.7 10.1  NEUTROABS 8.6*  --   --   --   --   --   HGB 9.9* 9.0* 8.8* 9.0* 8.8* 9.3*  HCT 33.1* 29.9* 30.3* 29.9* 29.1* 30.1*  MCV 93.5 95.5 96.2 94.6 94.2 91.5  PLT 388 362 354 344 345 997    Basic Metabolic Panel: Recent Labs  Lab 12/17/18 0506 12/18/18 0607 12/19/18 0520 12/20/18 0500 12/21/18 0500 12/22/18 0446  NA 146* 150* 149* 144 144 142  K 3.9 3.4* 3.7 3.6 4.0 3.7  CL 106 118* 117* 110 110 108  CO2 _0 GLUCOSE 220* 90 165* 72 140* 81  BUN 40* 33* 32* 26* 24* 24*  CREATININE 1.25* 0.99 1.00 0.80 0.83 0.79  CALCIUM 8.7* 7.9* 8.2* 8.2* 8.3* 8.3*  MG 2.4  --  2.5* 2.3 2.3 2.2  PHOS 5.0* 3.6 4.6  4.6 4.7* 4.6  --    GFR: Estimated Creatinine Clearance: 118.3 mL/min (by C-G formula based on SCr of 0.79 mg/dL). Recent Labs  Lab 12/19/18 0520 12/20/18 0500 12/21/18 0500 12/22/18 0446  WBC 6.5 8.7 7.7 10.1    Liver Function Tests: Recent Labs  Lab 12/18/18 0607  12/19/18 0520  ALBUMIN 1.4* 1.5*   No results for input(s): LIPASE, AMYLASE in the last 168 hours. No results for input(s): AMMONIA in the last 168 hours.  ABG    Component Value Date/Time   PHART 7.282 (L) 12/07/2018 0349   PCO2ART 50.2 (H) 12/07/2018 0349   PO2ART 59.0 (L) 12/07/2018 0349   HCO3 23.4 12/07/2018 0349   TCO2 25 12/07/2018 0349   ACIDBASEDEF 3.0 (H) 12/07/2018 0349   O2SAT 84.0 12/07/2018 0349     Coagulation Profile: No results for input(s): INR, PROTIME in the last 168 hours.  Cardiac Enzymes: No results for input(s): CKTOTAL, CKMB, CKMBINDEX, TROPONINI in the last 168 hours.  HbA1C: Hgb A1c MFr Bld  Date/Time Value Ref Range Status  03/25/2017 05:59 AM 12.3 (H) 4.8 - 5.6 % Final    Comment:    (NOTE)         Pre-diabetes: 5.7 - 6.4         Diabetes: >6.4         Glycemic control for adults with diabetes: <7.0   12/28/2015 08:50 PM 11.7 (  H) 4.8 - 5.6 % Final    Comment:    (NOTE)         Pre-diabetes: 5.7 - 6.4         Diabetes: >6.4         Glycemic control for adults with diabetes: <7.0     CBG: Recent Labs  Lab 12/21/18 1616 12/21/18 1951 12/21/18 2344 12/22/18 0311 12/22/18 0831  GLUCAP 173* 122* 159* 113* 97     Critical care time: 31 minutes    Roselie Awkward, MD [facility] Pager: [phone] Cell: [phone] If no response, call [phone]

## 2018-12-22 NOTE — Progress Notes (Signed)
Progress Note  Patient Name: Antonio [name]. Date of Encounter: 12/22/2018  Primary Cardiologist: Minus Breeding, MD   Subjective   No CP or dyspnea  Inpatient Medications    Scheduled Meds: . aspirin  81 mg Per Tube Daily  . atorvastatin  40 mg Oral q1800  . bethanechol  10 mg Per Tube QID  . chlorhexidine gluconate (MEDLINE KIT)  15 mL Mouth Rinse BID  . Chlorhexidine Gluconate Cloth  6 each Topical Daily  . clonazePAM  0.5 mg Per Tube BID  . cloNIDine  0.1 mg Per Tube Q6H  . collagenase   Topical Daily  . enoxaparin (LOVENOX) injection  40 mg Subcutaneous Daily  . famotidine  20 mg Per Tube BID  . feeding supplement (PRO-STAT SUGAR FREE 64)  60 mL Per Tube BID  . fentaNYL  75 mcg Transdermal Q72H  . folic acid  1 mg Per Tube Daily  . free water  300 mL Per Tube Q6H  . guaiFENesin  5 mL Per Tube Q12H  . insulin aspart  0-20 Units Subcutaneous Q4H  . insulin detemir  45 Units Subcutaneous BID  . ipratropium-albuterol  3 mL Nebulization TID  . losartan  25 mg Oral Daily  . mouth rinse  15 mL Mouth Rinse 10 times per day  . multivitamin  15 mL Oral Daily  . QUEtiapine  200 mg Per Tube BID  . sodium chloride flush  10-40 mL Intracatheter Q12H  . thiamine  100 mg Per Tube Daily   Continuous Infusions: . sodium chloride 5 mL/hr at 12/19/18 1726  . dexmedetomidine 0.4 mcg/kg/hr (12/22/18 0452)  . feeding supplement (VITAL 1.5 CAL) 55 mL/hr at 12/20/18 1823  . vancomycin Stopped (12/22/18 0733)   PRN Meds: sodium chloride, acetaminophen (TYLENOL) oral liquid 160 mg/5 mL, albuterol, bisacodyl, docusate, fentaNYL (SUBLIMAZE) injection, ibuprofen, LORazepam, polyethylene glycol, sodium chloride flush   Vital Signs    Vitals:   12/22/18 0800 12/22/18 0829 12/22/18 0900 12/22/18 1000  BP: (!) 120/95 121/68 (!) 117/57 115/70  Pulse: 83 85 87 80  Resp:  (!) 22 (!) 22 (!) 22  Temp: 99.4 F (37.4 C)     TempSrc: Oral     SpO2: 99% 98% 97% 94%  Weight:        Height:        Intake/Output Summary (Last 24 hours) at 12/22/2018 1059 Last data filed at 12/22/2018 0700 Gross per 24 hour  Intake 2397.94 ml  Output 2475 ml  Net -77.06 ml   Last 3 Weights 12/19/2018 12/18/2018 12/17/2018  Weight (lbs) 212 lb 11.9 oz 211 lb 10.3 oz 208 lb 12.4 oz  Weight (kg) 96.5 kg 96 kg 94.7 kg      Telemetry    Sinus with occasional PVC- Personally Reviewed   Physical Exam   GEN: No acute distress.   Neck: trach Cardiac: RRR, no murmurs, rubs, or gallops.  Respiratory: Clear to auscultation bilaterally. GI: Soft, nontender, non-distended  MS: No edema; prior BKA Neuro:  Nonfocal   Labs    Chemistry Recent Labs  Lab 12/18/18 0607 12/19/18 0520 12/20/18 0500 12/21/18 0500 12/22/18 0446  NA 150* 149* 144 144 142  K 3.4* 3.7 3.6 4.0 3.7  CL 118* 117* 110 110 108  CO2 '27 25 24 26 27  ' GLUCOSE 90 165* 72 140* 81  BUN 33* 32* 26* 24* 24*  CREATININE 0.99 1.00 0.80 0.83 0.79  CALCIUM 7.9* 8.2* 8.2* 8.3* 8.3*  ALBUMIN  1.4* 1.5*  --   --   --   GFRNONAA >60 >60 >60 >60 >60  GFRAA >60 >60 >60 >60 >60  ANIONGAP '5 7 10 8 7     ' Hematology Recent Labs  Lab 12/20/18 0500 12/21/18 0500 12/22/18 0446  WBC 8.7 7.7 10.1  RBC 3.16* 3.09* 3.29*  HGB 9.0* 8.8* 9.3*  HCT 29.9* 29.1* 30.1*  MCV 94.6 94.2 91.5  MCH 28.5 28.5 28.3  MCHC 30.1 30.2 30.9  RDW 14.1 13.9 13.9  PLT 344 345 355    BNP Recent Labs  Lab 12/17/18 0506  BNP 48.7      Radiology    Dg Chest Port 1 View  Result Date: 12/22/2018 CLINICAL DATA:  Respiratory failure. EXAM: PORTABLE CHEST 1 VIEW COMPARISON:  12/21/2018 FINDINGS: Tracheostomy tube overlies the airway. A feeding tube courses into the upper abdomen with tip not imaged. A right PICC remains in place with tip poorly visualized. The cardiomediastinal silhouette is unchanged. Lung volumes remain low with unchanged bibasilar opacities. No sizable pleural effusion or pneumothorax is identified. IMPRESSION: Low lung  volumes with unchanged bibasilar opacities which may reflect atelectasis or infection. Electronically Signed   By: Logan Bores M.D.   On: 12/22/2018 07:56   Dg Chest Port 1 View  Result Date: 12/21/2018 CLINICAL DATA:  Tracheostomy and shortness of breath EXAM: PORTABLE CHEST 1 VIEW COMPARISON:  Yesterday FINDINGS: Tracheostomy tube in place. A feeding tube at least reaches the stomach. Right upper extremity PICC in good position. Low volume chest with hazy opacity at the bases. No Kerley lines or pneumothorax. Normal heart size. IMPRESSION: 1. Stable hardware positioning. 2. Stable low volume chest with atelectasis or infection at the bases. Electronically Signed   By: Monte Fantasia M.D.   On: 12/21/2018 08:05    Patient Profile     63 y.o.malewith PMH of CAD, ICM with previous baseline of 40%, HTN, HLD and DM II who presented01/03with acute respiratory failure due to influenza. EF was 20-25% on echo. He was intubated and treated for cardiogenic shock and respiratory failure>>trach 01/16. Pending left and right heartcathoncemore medically stable.  Assessment & Plan    1 ischemic cardiomyopathy-LV function worse compared to previous.  Blood pressure is better.  Continue ARB.  Add carvedilol 3.125 mg twice daily.  Advance as tolerated by blood pressure.  Plan is ultimately right and left catheterization but needs to make significant improvement prior to procedure.  2 hypertension-blood pressure has improved.  Would wean clonidine to off and instead advance ARB and carvedilol given severe LV dysfunction.  3 acute respiratory failure-occurred in the setting of influenza complicated by Corynebacterium pneumonia.  He is status post tracheostomy.  Ventilator management per critical care medicine.  For questions or updates, please contact [place] Please consult [email or web address] for contact info under        Signed, Kirk Ruths, MD  12/22/2018, 10:59 AM

## 2018-12-22 NOTE — Plan of Care (Signed)
  Problem: Health Behavior/Discharge Planning: Goal: Ability to manage health-related needs will improve Outcome: Not Progressing   Problem: Clinical Measurements: Goal: Respiratory complications will improve Outcome: Not Progressing   Problem: Activity: Goal: Risk for activity intolerance will decrease Outcome: Progressing

## 2018-12-22 NOTE — Progress Notes (Signed)
Pharmacy Antibiotic Note  Antonio [name]. is a 63 y.o. male admitted on 11/28/2018 with influenza A, respiratory failure, and cardiogenic shock with prolonged mechanical ventilation and tracheostomy. Patient has been difficult to wean from vent due to increased secretions requiring trach revision today. Patient was noted to have a new fever concerning for new pneumonia.  Pharmacy has been consulted for Vancomycin dosing.   Vancomycin Peak 46.7 (calculated), trough level 22 (measured). Current AUC 780. Goal AUC is ~500.   Pt is now on D7 of vanc for his CORYNEBACTERIUM STRIATUM PNA. These are very sens to vanc. His infection markers are good. D/w with Dr Kendrick Fries today and we will dc abx.   Plan: Dc vanc   Height: 6' (182.9 cm) Weight: 212 lb 11.9 oz (96.5 kg) IBW/kg (Calculated) : 77.6  Temp (24hrs), Avg:99.7 F (37.6 C), Min:98.7 F (37.1 C), Max:101.4 F (38.6 C)  Recent Labs  Lab 12/18/18 0607 12/18/18 0641 12/19/18 0520 12/19/18 2125 12/20/18 0500 12/20/18 0929 12/20/18 1646 12/21/18 0500 12/22/18 0446  WBC  --  8.1 6.5  --  8.7  --   --  7.7 10.1  CREATININE 0.99  --  1.00  --  0.80  --   --  0.83 0.79  VANCOTROUGH  --   --   --   --   --   --  22*  --   --   VANCOPEAK  --   --   --  35  --  38  --   --   --     Estimated Creatinine Clearance: 118.3 mL/min (by C-G formula based on SCr of 0.79 mg/dL).    Allergies  Allergen Reactions  . Iodinated Diagnostic Agents Hives, Rash and Other (See Comments)    Blisters Staph Blisters / staph  . Penicillins Other (See Comments)    UNSPECIFIED REACTION  Has patient had a PCN reaction causing immediate rash, facial/tongue/throat swelling, SOB or lightheadedness with hypotension: No Has patient had a PCN reaction causing severe rash involving mucus membranes or skin necrosis: No Has patient had a PCN reaction that required hospitalization: No Has patient had a PCN reaction occurring within the last 10 years: No If all  of the above answers are "NO", then may proceed with Cephalosporin use.    Antimicrobials this admission: 1/10 vancomycin >1/12; 1/21 >> 1/10 ceftazidime >1/12; 1/21 >> 1/12 eryth >> (1/22)  Dose adjustments this admission: Decrease to 1000 mg IV q12h   Microbiology results: 1/21 Tracheal aspirate >> 1/9 resp cx:mod diphtheroids (corynebacterium) 1/10 blood cx:  negF 1/9 resp cx: NF 1/7 resp cx: NF 1/3 blood cx:neg 1/4 urine cx: neg 1/4 mrsa pcr: neg 1/3 RVP: +flu A  Dwayne A. Jeanella Craze, PharmD, BCPS Clinical Pharmacist [place] Pager: [phone] Please utilize Amion for appropriate phone number to reach the unit pharmacist [facility])   12/22/2018 2:14 PM

## 2018-12-22 NOTE — Progress Notes (Addendum)
Noted recommendation for LTAC hospital.  Unfortunately, pt is not eligible for LTAC, as he only has Medicaid as payor, and LTAC hospital will not accept.  Pt will need vent SNF if unable to wean from ventilator.  CSW to follow up with family.  Quintella BatonJulie W. Sacora Hawbaker, RN, BSN  Trauma/Neuro ICU Case Manager [phone]

## 2018-12-22 NOTE — Consult Note (Signed)
Chief Complaint: Patient was seen in consultation today for percutaneous gastric tube placement Chief Complaint  Patient presents with  . Chest Pain  . Shortness of Breath   at the request of Dr Curley Spice   Supervising Physician: Oley Balm  Patient Status: [facility] - In-pt  History of Present Illness: Antonio [name]. is a 63 y.o. male   Admitted with Infuenza A PNA with acute respiratory failure Intubated 1/4;  Trach 1/16 Complicated course with MI CHF and cardiogenic shock Mucous plugging  Dysphagia; metabolic encephalopathy Prolonged ventilation cortrak in place for days Malnutrition Need for long term care-- to go to LTAC soon  Request for percutaneous gastric tube placement Imaging reviewed  Dr Deanne Coffer has approved procedure   Past Medical History:  Diagnosis Date  . Coronary artery disease   . Diabetes mellitus without complication (HCC)   . Hyperlipidemia   . Hypertension   . Osteomyelitis (HCC) 03/25/2017   RT FOOT    Past Surgical History:  Procedure Laterality Date  . AMPUTATION Right 03/27/2017   Procedure: 1st Ray Amputation Right Foot;  Surgeon: Nadara Mustard, MD;  Location: [facility] OR;  Service: Orthopedics;  Laterality: Right;  . APPENDECTOMY    . CARDIAC CATHETERIZATION    . CORONARY STENT INTERVENTION  2005  . Great toe amputation right.    . I&D EXTREMITY Left 12/30/2015   Procedure: IRRIGATION AND DEBRIDEMENT LEFT FOOT, TRANSMETATARSAL AMPUTATION WITH APPLICATION OF ANTIBIOTIC BEADS AND WOUND VAC;  Surgeon: Nadara Mustard, MD;  Location: [facility] OR;  Service: Orthopedics;  Laterality: Left;  . TONSILLECTOMY      Allergies: Iodinated diagnostic agents and Penicillins  Medications: Prior to Admission medications   Medication Sig Start Date End Date Taking? Authorizing Provider  aspirin EC 81 MG tablet Take 81 mg by mouth daily.   Yes [provider]  atorvastatin (LIPITOR) 40 MG tablet Take 1 tablet (40 mg total) by mouth daily  at 6 PM. 03/28/17  Yes Howard Pouch, MD  glipiZIDE (GLUCOTROL) 10 MG tablet Take 10 mg by mouth 2 (two) times daily before a meal.   Yes [provider]  Insulin Degludec (TRESIBA) 100 UNIT/ML SOLN Inject 70 Units into the skin at bedtime.    Yes [provider]  insulin glargine (LANTUS) 100 UNIT/ML injection Inject 0.05 mLs (5 Units total) into the skin at bedtime. 03/28/17  Yes Howard Pouch, MD  insulin regular (NOVOLIN R,HUMULIN R) 100 units/mL injection Inject 2-4 Units into the skin as needed for high blood sugar. Only of over 150 mg   Yes [provider]  lisinopril (PRINIVIL,ZESTRIL) 5 MG tablet Take 5 mg by mouth 2 (two) times daily.   Yes [provider]  metoprolol tartrate (LOPRESSOR) 25 MG tablet Take 25 mg by mouth 2 (two) times daily.   Yes [provider]  Multiple Vitamin (MULTIVITAMIN) tablet Take 1 tablet by mouth daily.   Yes [provider]  tapentadol HCl (NUCYNTA) 75 MG tablet Take 75 mg by mouth 4 (four) times daily.    Yes [provider]     Family History  Problem Relation Age of Onset  . Diabetes Mother   . Emphysema Father   . CAD Sister 14       Died of MI after flu    Social History   Socioeconomic History  . Marital status: Divorced    Spouse name: Not on file  . Number of children: Not on file  .  Years of education: Not on file  . Highest education level: Not on file  Occupational History  . Not on file  Social Needs  . Financial resource strain: Not on file  . Food insecurity:    Worry: Not on file    Inability: Not on file  . Transportation needs:    Medical: Not on file    Non-medical: Not on file  Tobacco Use  . Smoking status: Current Every Day Smoker    Packs/day: 2.00    Years: 35.00    Pack years: 70.00    Types: Cigarettes  . Smokeless tobacco: Never Used  Substance and Sexual Activity  . Alcohol use: Yes    Comment: beers occasionally  . Drug use: No  . Sexual  activity: Not on file  Lifestyle  . Physical activity:    Days per week: Not on file    Minutes per session: Not on file  . Stress: Not on file  Relationships  . Social connections:    Talks on phone: Not on file    Gets together: Not on file    Attends religious service: Not on file    Active member of club or organization: Not on file    Attends meetings of clubs or organizations: Not on file    Relationship status: Not on file  Other Topics Concern  . Not on file  Social History Narrative   Lives with mother.  He has three children.      Review of Systems: A 12 point ROS discussed and pertinent positives are indicated in the HPI above.  All other systems are negative.  Review of Systems  Constitutional:       Intubated  Respiratory:       Vent/trach    Vital Signs: BP (!) 147/76   Pulse (!) 107   Temp 99.4 F (37.4 C) (Oral)   Resp (!) 24   Ht 6' (1.829 m)   Wt 212 lb 11.9 oz (96.5 kg)   SpO2 96%   BMI 28.85 kg/m   Physical Exam Vitals signs reviewed.  Abdominal:     Palpations: Abdomen is soft.  Musculoskeletal:     Comments: No movement No response  Psychiatric:     Comments: Spoke to parent in room Spoke to son Josh via phone Still deciding     Imaging: Ct Abdomen Pelvis Wo Contrast  Result Date: 11/28/2018 CLINICAL DATA:  Midsternal chest pain. Shortness of breath. By report the patient has an IV contrast allergy. EXAM: CT CHEST, ABDOMEN AND PELVIS WITHOUT CONTRAST TECHNIQUE: Multidetector CT imaging of the chest, abdomen and pelvis was performed following the standard protocol without IV contrast. COMPARISON:  Chest radiograph 11/28/2018 FINDINGS: Factors adversely affecting the sensitivity and specificity of today's exam: 1. Extensive breathing motion artifact. 2. Suboptimal positioning of the arms and hands, with the fingers and hands interlocked in folded across the chest, introducing streak artifact. CT CHEST FINDINGS Cardiovascular: Coronary,  aortic arch, and branch vessel atherosclerotic vascular disease. Mediastinum/Nodes: 1.0 cm in short axis right paratracheal lymph node on image 26/3. Lungs/Pleura: Blurred parenchyma due to extensive breathing motion artifact. Presumably the patient was unable to hold his breath. Suspected airway thickening. Musculoskeletal: Lower thoracic spondylosis. CT ABDOMEN PELVIS FINDINGS Hepatobiliary: Unremarkable Pancreas: Unremarkable Spleen: Unremarkable Adrenals/Urinary Tract: Vascular calcification in the right renal hilum. Suspected left peripelvic cysts. Adrenal glands normal. Stomach/Bowel: Unremarkable Vascular/Lymphatic: Aortoiliac atherosclerotic vascular disease. Small scattered bilateral external iliac and inguinal lymph nodes. Reproductive: Amorphous calcifications  centrally in the prostate gland. Other: No supplemental non-categorized findings. Musculoskeletal: Disc osteophyte complex at L5-S1. IMPRESSION: 1. Airway thickening is present, suggesting bronchitis or reactive airways disease. 2. Coronary, aortic arch, and branch vessel atherosclerotic vascular disease. Aortic Atherosclerosis (ICD10-I70.0). 3. Borderline enlarged right paratracheal lymph node, nonspecific. 4. Motion artifact and suboptimal arm positioning adversely affect sensitivity and specificity. Electronically Signed   By: Gaylyn Rong M.D.   On: 11/28/2018 19:18   Dg Abd 1 View  Result Date: 12/04/2018 CLINICAL DATA:  OG tube placement EXAM: ABDOMEN - 1 VIEW COMPARISON:  12/01/2018 FINDINGS: OG tube coils in the stomach. Nonobstructive bowel gas pattern. No organomegaly or visible free air. IMPRESSION: OG tube coils in the stomach. Electronically Signed   By: Charlett Nose M.D.   On: 12/04/2018 11:24   Ct Chest Wo Contrast  Result Date: 11/28/2018 CLINICAL DATA:  Midsternal chest pain. Shortness of breath. By report the patient has an IV contrast allergy. EXAM: CT CHEST, ABDOMEN AND PELVIS WITHOUT CONTRAST TECHNIQUE: Multidetector  CT imaging of the chest, abdomen and pelvis was performed following the standard protocol without IV contrast. COMPARISON:  Chest radiograph 11/28/2018 FINDINGS: Factors adversely affecting the sensitivity and specificity of today's exam: 1. Extensive breathing motion artifact. 2. Suboptimal positioning of the arms and hands, with the fingers and hands interlocked in folded across the chest, introducing streak artifact. CT CHEST FINDINGS Cardiovascular: Coronary, aortic arch, and branch vessel atherosclerotic vascular disease. Mediastinum/Nodes: 1.0 cm in short axis right paratracheal lymph node on image 26/3. Lungs/Pleura: Blurred parenchyma due to extensive breathing motion artifact. Presumably the patient was unable to hold his breath. Suspected airway thickening. Musculoskeletal: Lower thoracic spondylosis. CT ABDOMEN PELVIS FINDINGS Hepatobiliary: Unremarkable Pancreas: Unremarkable Spleen: Unremarkable Adrenals/Urinary Tract: Vascular calcification in the right renal hilum. Suspected left peripelvic cysts. Adrenal glands normal. Stomach/Bowel: Unremarkable Vascular/Lymphatic: Aortoiliac atherosclerotic vascular disease. Small scattered bilateral external iliac and inguinal lymph nodes. Reproductive: Amorphous calcifications centrally in the prostate gland. Other: No supplemental non-categorized findings. Musculoskeletal: Disc osteophyte complex at L5-S1. IMPRESSION: 1. Airway thickening is present, suggesting bronchitis or reactive airways disease. 2. Coronary, aortic arch, and branch vessel atherosclerotic vascular disease. Aortic Atherosclerosis (ICD10-I70.0). 3. Borderline enlarged right paratracheal lymph node, nonspecific. 4. Motion artifact and suboptimal arm positioning adversely affect sensitivity and specificity. Electronically Signed   By: Gaylyn Rong M.D.   On: 11/28/2018 19:18   Dg Chest Port 1 View  Result Date: 12/22/2018 CLINICAL DATA:  Respiratory failure. EXAM: PORTABLE CHEST 1 VIEW  COMPARISON:  12/21/2018 FINDINGS: Tracheostomy tube overlies the airway. A feeding tube courses into the upper abdomen with tip not imaged. A right PICC remains in place with tip poorly visualized. The cardiomediastinal silhouette is unchanged. Lung volumes remain low with unchanged bibasilar opacities. No sizable pleural effusion or pneumothorax is identified. IMPRESSION: Low lung volumes with unchanged bibasilar opacities which may reflect atelectasis or infection. Electronically Signed   By: Sebastian Ache M.D.   On: 12/22/2018 07:56   Dg Chest Port 1 View  Result Date: 12/21/2018 CLINICAL DATA:  Tracheostomy and shortness of breath EXAM: PORTABLE CHEST 1 VIEW COMPARISON:  Yesterday FINDINGS: Tracheostomy tube in place. A feeding tube at least reaches the stomach. Right upper extremity PICC in good position. Low volume chest with hazy opacity at the bases. No Kerley lines or pneumothorax. Normal heart size. IMPRESSION: 1. Stable hardware positioning. 2. Stable low volume chest with atelectasis or infection at the bases. Electronically Signed   By: Kathrynn Ducking.D.  On: 12/21/2018 08:05   Dg Chest Port 1 View  Result Date: 12/20/2018 CLINICAL DATA:  Acute respiratory failure EXAM: PORTABLE CHEST 1 VIEW COMPARISON:  12/19/2018 FINDINGS: The tracheostomy tube tip is above the carina. There is a right IJ catheter with tip projecting over the right atrium. The tip of the enteric tube is below the level of the GE junction. Normal heart size. Pleural effusions appear decreased from previous exam. Pulmonary vascular congestion, improved. IMPRESSION: 1. Decrease in bilateral pleural effusions and pulmonary vascular congestion 2. Stable support apparatus.  The Electronically Signed   By: Signa Kell M.D.   On: 12/20/2018 07:11   Dg Chest Port 1 View  Result Date: 12/19/2018 CLINICAL DATA:  Respiratory failure. EXAM: PORTABLE CHEST 1 VIEW COMPARISON:  December 18, 2018. FINDINGS: Endotracheal tube tip is  6.3 cm above the carina. Feeding tube tip is below the diaphragm. Central catheter tip is in the right atrium, stable. No pneumothorax. There are pleural effusions bilaterally with areas of consolidation in both bases. Upper lung zones are clear. Heart is mildly enlarged with pulmonary vascularity within normal limits. No adenopathy. No bone lesions. IMPRESSION: And catheter positions as described without pneumothorax. Persistent pleural effusions with areas of bibasilar consolidation. Concern for potential pneumonia in the bases with associated atelectasis. No new opacity evident. Stable cardiac silhouette. Electronically Signed   By: Bretta Bang III M.D.   On: 12/19/2018 07:12   Dg Chest Port 1 View  Result Date: 12/18/2018 CLINICAL DATA:  Patient on ventilator.  Respiratory failure EXAM: PORTABLE CHEST 1 VIEW COMPARISON:  December 17, 2018 FINDINGS: The tracheostomy tube and right PICC line remain in place. The feeding tube is not well seen distally due to poor penetration. There appear to be small bilateral layering effusions with underlying atelectasis. The cardiomediastinal silhouette is stable. No other abnormalities. IMPRESSION: Suggested small layering pleural effusions with underlying opacities. No other interval changes. Electronically Signed   By: Gerome Sam III M.D   On: 12/18/2018 10:57   Dg Chest Port 1 View  Result Date: 12/17/2018 CLINICAL DATA:  Hypoxia EXAM: PORTABLE CHEST 1 VIEW COMPARISON:  12/16/2018 FINDINGS: Right-sided PICC line, feeding catheter and tracheostomy tube are noted in place. Cardiac shadow remains enlarged but stable. Bibasilar atelectatic changes and small effusions are again identified. No new focal abnormality is seen. IMPRESSION: No significant change from the prior exam. Electronically Signed   By: Alcide Clever M.D.   On: 12/17/2018 07:45   Dg Chest Port 1 View  Result Date: 12/16/2018 CLINICAL DATA:  Tracheostomy.  Fever. EXAM: PORTABLE CHEST 1 VIEW  COMPARISON:  12/15/2017. FINDINGS: Tracheostomy tube, feeding tube, right PICC line in stable position. Heart size stable. Stable cardiomegaly. Persistent bibasilar atelectasis and infiltrates with slight improvement in aeration from prior exam. Tiny bilateral pleural effusions could not be excluded. No pneumothorax. IMPRESSION: 1. Tracheostomy tube, feeding tube, right PICC line in stable position. 2. Persistent bibasilar atelectasis and infiltrates with slight improvement in aeration from prior exam. Electronically Signed   By: Maisie Fus  Register   On: 12/16/2018 07:15   Dg Chest Port 1 View  Result Date: 12/15/2018 CLINICAL DATA:  Respiratory failure.  Pneumonia. EXAM: PORTABLE CHEST 1 VIEW COMPARISON:  Three days ago FINDINGS: Tracheostomy tube remains well seated. Feeding tube that at least reaches the stomach. Right upper extremity PICC with tip at the upper cavoatrial junction. Haziness of the lower chest that is unchanged. No Kerley lines, definite effusion, or pneumothorax. Mild cardiomegaly. IMPRESSION: Stable and  unremarkable hardware positioning. History of pneumonia with unchanged opacity at the lung bases. Electronically Signed   By: Marnee Spring M.D.   On: 12/15/2018 06:11   Dg Chest Port 1 View  Result Date: 12/12/2018 CLINICAL DATA:  Pneumonia EXAM: PORTABLE CHEST 1 VIEW COMPARISON:  12/11/2018 FINDINGS: Tracheostomy tube is again seen and stable. Feeding catheter is noted extending into the stomach. Right-sided PICC line is noted at the cavoatrial junction. Cardiac shadow is mildly enlarged but stable. Small bilateral pleural effusions are seen. No focal confluent infiltrate is noted. IMPRESSION: Small effusions without focal infiltrate. Electronically Signed   By: Alcide Clever M.D.   On: 12/12/2018 07:29   Dg Chest Port 1 View  Result Date: 12/11/2018 CLINICAL DATA:  Tracheostomy. EXAM: PORTABLE CHEST 1 VIEW COMPARISON:  12/11/2018. FINDINGS: Interim tracheostomy. Feeding tube and  right PICC line stable position. Cardiomegaly. Improved bilateral from interstitial prominence suggesting improving CHF. No pleural effusion or pneumothorax. IMPRESSION: 1. Interim tracheostomy. 2. Feeding tube and right PICC line stable position. 3. Improving CHF with mild residual pulmonary interstitial edema and small pleural effusions. Electronically Signed   By: Maisie Fus  Register   On: 12/11/2018 11:19   Dg Chest Port 1 View  Result Date: 12/11/2018 CLINICAL DATA:  Endotracheal tube placement. Pneumonia. EXAM: PORTABLE CHEST 1 VIEW COMPARISON:  12/10/2018 FINDINGS: Endotracheal tip is 4 cm above the carina. Soft feeding tube enters the abdomen. Right arm PICC tip in the right atrium. Slight worsening radiographic findings of bilateral lower lobe pneumonia and volume loss. There may be a small amount of pleural fluid accumulating on the right IMPRESSION: Slight worsening of bilateral lower lobe pneumonia and volume loss. Possible accumulating pleural fluid on the right. Electronically Signed   By: Paulina Fusi M.D.   On: 12/11/2018 07:42   Dg Chest Port 1 View  Result Date: 12/10/2018 CLINICAL DATA:  Respiratory failure requiring intubation EXAM: PORTABLE CHEST 1 VIEW COMPARISON:  Yesterday FINDINGS: Endotracheal tube tip at the clavicular heads. An orogastric tube reaches the stomach. Right upper extremity PICC with tip at the upper right atrium. Low volume chest with haziness and cardiomegaly. IMPRESSION: 1. Stable hardware. 2. Low volume chest with atelectasis and vascular congestion. Electronically Signed   By: Marnee Spring M.D.   On: 12/10/2018 08:04   Dg Chest Port 1 View  Result Date: 12/09/2018 CLINICAL DATA:  Endotracheal tube placement. EXAM: PORTABLE CHEST 1 VIEW COMPARISON:  Radiograph December 08, 2018. FINDINGS: Stable cardiomegaly. Endotracheal nasogastric tubes are unchanged in position. Right-sided PICC line is unchanged in position. No pneumothorax is noted. Stable bibasilar  opacities are noted concerning for edema or atelectasis with associated pleural effusions. Bony thorax is unremarkable. IMPRESSION: Stable bibasilar edema or atelectasis is noted with associated pleural effusions. Stable support apparatus. Electronically Signed   By: Lupita Raider, M.D.   On: 12/09/2018 07:28   Dg Chest Port 1 View  Result Date: 12/08/2018 CLINICAL DATA:  Follow-up endotracheal tube placement EXAM: PORTABLE CHEST 1 VIEW COMPARISON:  12/07/2018 FINDINGS: Endotracheal tube, nasogastric catheter and right-sided PICC line are noted. PICC line remains in the midportion of the right atrium. The lungs are well aerated bilaterally. Small bilateral pleural effusions are noted left slightly greater than right. No focal infiltrate is seen. IMPRESSION: Tubes and lines as described above. Small effusions bilaterally. Electronically Signed   By: Alcide Clever M.D.   On: 12/08/2018 07:07   Dg Chest Port 1 View  Result Date: 12/07/2018 CLINICAL DATA:  Pneumonia EXAM:  PORTABLE CHEST 1 VIEW COMPARISON:  Chest radiograph 12/06/2018 FINDINGS: ET tube mid trachea. Right upper extremity PICC line tip projects over the right atrium. Enteric tube courses inferior to the diaphragm. Monitoring leads overlie the patient. Stable cardiomegaly. Bilateral interstitial opacities. Small left pleural effusion with underlying opacities. Right basilar opacities. No pneumothorax. IMPRESSION: Support apparatus as above including right upper extremity PICC line with tip projecting over the right atrium. Mild interstitial edema. Small left effusion with underlying opacities. Persistent opacities right lung base. Electronically Signed   By: Annia Beltrew  Davis M.D.   On: 12/07/2018 09:08   Dg Chest Port 1 View  Result Date: 12/06/2018 CLINICAL DATA:  Respiratory failure. EXAM: PORTABLE CHEST 1 VIEW COMPARISON:  One-view chest x-ray 12/05/2018 FINDINGS: The heart size is normal. Endotracheal tube terminates just be on the clavicles, in  normal position. Right-sided PICC line is stable. NG tube courses off the inferior border of the film. Mild pulmonary vascular congestion is stable. Bibasilar airspace disease and effusions are stable. IMPRESSION: 1. Similar appearance of pulmonary vascular congestion with bibasilar airspace disease and probable effusions. Congestive heart failure is not excluded. 2. Bibasilar disease likely reflects atelectasis. Infection is not excluded. 3. Support apparatus is stable and in satisfactory position. Electronically Signed   By: Marin Robertshristopher  Mattern M.D.   On: 12/06/2018 08:19   Dg Chest Port 1 View  Result Date: 12/05/2018 CLINICAL DATA:  Short of breath and chest pain EXAM: PORTABLE CHEST 1 VIEW COMPARISON:  12/04/2018 FINDINGS: NG tube is stable. The tip is beyond the gastroesophageal junction. Endotracheal tube tip is 4.2 cm from the carina. Right upper extremity PICC is stable. Lungs are under aerated. Bibasilar pulmonary opacities right greater than left are improved. Upper lungs clear. No pneumothorax. IMPRESSION: Improved bibasilar pulmonary opacities. Electronically Signed   By: Jolaine ClickArthur  Hoss M.D.   On: 12/05/2018 08:43   Portable Chest X-ray  Result Date: 12/04/2018 CLINICAL DATA:  Hypoxia EXAM: PORTABLE CHEST 1 VIEW COMPARISON:  December 04, 2018 study obtained earlier in the day FINDINGS: There is now an endotracheal tube present with endotracheal tube tip 2.6 cm above the carina. Nasogastric tube tip and side port are below the diaphragm. Central catheter tip is in the superior vena cava at the cavoatrial junction level. No appreciable pneumothorax. There is consolidation in the right base with small right pleural effusion. There is mild left base atelectasis. Heart size and pulmonary vascularity are normal. No adenopathy. No bone lesions. IMPRESSION: Tube and catheter positions as described without evident pneumothorax. Consolidation consistent with pneumonia right base. There is mild left base  atelectasis. Stable cardiac silhouette. Electronically Signed   By: Bretta BangWilliam  Woodruff III M.D.   On: 12/04/2018 10:57   Dg Chest Port 1 View  Result Date: 12/04/2018 CLINICAL DATA:  Respiratory insufficiency.  Shortness of breath. EXAM: PORTABLE CHEST 1 VIEW COMPARISON:  12/02/2018.  12/01/2018. FINDINGS: Interim removal of NG tube. Right PICC line again noted with tip over right atrium. Progressive right base atelectasis/infiltrate. Small right pleural effusion. No pneumothorax. Cardiomegaly with pulmonary venous congestion. IMPRESSION: 1. Interim removal of NG tube. Right PICC line tip again noted over the right atrium in unchanged position. 2. Progressive right base atelectasis/infiltrate. Small right pleural effusion. 3.  Cardiomegaly pulmonary venous congestion. Electronically Signed   By: Maisie Fushomas  Register   On: 12/04/2018 06:29   Portable Chest X-ray  Result Date: 12/02/2018 CLINICAL DATA:  Endotracheal orogastric tube placement EXAM: PORTABLE CHEST 1 VIEW COMPARISON:  Yesterday FINDINGS: Endotracheal tube tip  at the clavicular heads. An orogastric tube tip at least reaches the stomach. Right upper extremity PICC with tip at the upper cavoatrial junction. Low volume chest with interstitial coarsening and streaky basilar density. No effusion or pneumothorax. IMPRESSION: Unremarkable hardware positioning and stable inflation. Electronically Signed   By: Marnee Spring M.D.   On: 12/02/2018 04:45   Dg Chest Port 1 View  Result Date: 12/01/2018 CLINICAL DATA:  Respiratory distress. EXAM: PORTABLE CHEST 1 VIEW COMPARISON:  Radiograph of December 01, 2018. FINDINGS: Stable cardiomediastinal silhouette. Mild central pulmonary vascular congestion is noted. No pneumothorax or pleural effusion is noted. No consolidative process is noted. Bony thorax is unremarkable. IMPRESSION: Mild central pulmonary vascular congestion. No consolidative process is noted. Electronically Signed   By: Lupita Raider, M.D.   On:  12/01/2018 21:46   Dg Chest Port 1 View  Result Date: 12/01/2018 CLINICAL DATA:  Fever and cough.  Influenza EXAM: PORTABLE CHEST 1 VIEW COMPARISON:  Yesterday FINDINGS: Endotracheal tube tip just below the clavicular heads. An orogastric tube reaches the stomach. New PICC with tip at the upper right atrium. Low volume chest with interstitial coarsening, stable. No focal consolidation. Normal heart size. IMPRESSION: 1. Stable low volume chest with mild interstitial opacity. 2. Unremarkable hardware positioning. Electronically Signed   By: Marnee Spring M.D.   On: 12/01/2018 07:43   Dg Chest Port 1 View  Result Date: 11/30/2018 CLINICAL DATA:  Acute respiratory failure.  Hypoxia. EXAM: PORTABLE CHEST 1 VIEW COMPARISON:  11/29/2018 FINDINGS: Enteric tube courses through the stomach and off the film as tip is not visualized. Endotracheal tube has tip 6.6 cm above the carina. Lungs are adequately inflated without lobar consolidation or effusion. Subtle stable prominence of the perihilar markings. Cardiomediastinal silhouette and remainder of the exam is unchanged. IMPRESSION: Subtle hazy prominence of the perihilar markings which may be due to minimal vascular congestion or acute bronchitic process. Tubes and lines as described. Electronically Signed   By: Elberta Fortis M.D.   On: 11/30/2018 08:16   Dg Chest Port 1 View  Result Date: 11/29/2018 CLINICAL DATA:  Intubation. EXAM: PORTABLE CHEST 1 VIEW COMPARISON:  11/28/2018 FINDINGS: Endotracheal tube has tip 5.8 cm above the carina. Nasogastric tube courses into the region of the stomach and off the inferior portion of the film as tip is not visualized. Patient is slightly rotated to the left. Lungs are adequately inflated demonstrate no focal lobar consolidation or effusion. Cardiomediastinal silhouette and remainder of the exam is unchanged. IMPRESSION: No acute findings. Tubes and lines as described. Electronically Signed   By: Elberta Fortis M.D.   On:  11/29/2018 08:26   Dg Chest Portable 1 View  Result Date: 11/28/2018 CLINICAL DATA:  Intubation. EXAM: PORTABLE CHEST 1 VIEW COMPARISON:  Radiographs and CT earlier this day. FINDINGS: Endotracheal tube tip at the thoracic inlet 7 cm from the carina. Unchanged heart size and mediastinal contours. Bronchial thickening again seen. No new airspace disease, pleural effusion or pneumothorax. Multiple overlying monitoring devices in place. IMPRESSION: 1. Endotracheal tube tip at the thoracic inlet 7 cm from the carina. 2. Unchanged bronchial thickening. Electronically Signed   By: Narda Rutherford M.D.   On: 11/28/2018 22:56   Dg Chest Port 1 View  Result Date: 11/28/2018 CLINICAL DATA:  Chest pain. EXAM: PORTABLE CHEST 1 VIEW COMPARISON:  04/29/2018 FINDINGS: Lungs are adequately inflated as patient is slightly rotated to the right. There is no focal airspace consolidation or effusion. Borderline cardiomegaly. Remainder  of the exam is unchanged. IMPRESSION: No active disease. Electronically Signed   By: Elberta Fortisaniel  Boyle M.D.   On: 11/28/2018 17:50   Dg Abd Portable 1v  Result Date: 12/16/2018 CLINICAL DATA:  Vomiting EXAM: PORTABLE ABDOMEN - 1 VIEW COMPARISON:  12/04/2018 FINDINGS: Feeding catheter is noted within the distal stomach. Scattered large and small bowel gas is noted without obstructive change. No free air is seen. No acute bony abnormality is noted. IMPRESSION: No acute abnormality noted. Electronically Signed   By: Alcide CleverMark  Lukens M.D.   On: 12/16/2018 12:26   Dg Abd Portable 1v  Result Date: 12/01/2018 CLINICAL DATA:  NG tube placement EXAM: PORTABLE ABDOMEN - 1 VIEW COMPARISON:  11/29/2018 FINDINGS: Esophageal tube tip overlies the gastric outlet. Visible gas pattern is nonobstructed. IMPRESSION: Insert tube tip overlies the gastric outlet region. Electronically Signed   By: Jasmine PangKim  Fujinaga M.D.   On: 12/01/2018 21:36   Dg Abd Portable 1v  Result Date: 11/29/2018 CLINICAL DATA:  Enteric tube  placement. EXAM: PORTABLE ABDOMEN - 1 VIEW COMPARISON:  Recent chest radiograph as well as previous CT 11/28/2018. FINDINGS: Evidence of patient's enteric tube which courses into the stomach as tip is right of midline likely over the distal stomach or proximal duodenum. Bowel gas pattern is nonobstructive. No free peritoneal air. Remainder the exam is unchanged. IMPRESSION: Enteric tube with tip over the right upper quadrant likely over the distal stomach or proximal duodenum. Electronically Signed   By: Elberta Fortisaniel  Boyle M.D.   On: 11/29/2018 08:28   [place] Ekg Site Rite  Result Date: 11/30/2018 If Site Rite image not attached, placement could not be confirmed due to current cardiac rhythm.   Labs:  CBC: Recent Labs    12/19/18 0520 12/20/18 0500 12/21/18 0500 12/22/18 0446  WBC 6.5 8.7 7.7 10.1  HGB 8.8* 9.0* 8.8* 9.3*  HCT 30.3* 29.9* 29.1* 30.1*  PLT 354 344 345 355    COAGS: Recent Labs    11/28/18 1658 12/10/18 1242  INR 0.97 1.17  APTT 31 37*    BMP: Recent Labs    12/19/18 0520 12/20/18 0500 12/21/18 0500 12/22/18 0446  NA 149* 144 144 142  K 3.7 3.6 4.0 3.7  CL 117* 110 110 108  CO2 25 24 26 27   GLUCOSE 165* 72 140* 81  BUN 32* 26* 24* 24*  CALCIUM 8.2* 8.2* 8.3* 8.3*  CREATININE 1.00 0.80 0.83 0.79  GFRNONAA >60 >60 >60 >60  GFRAA >60 >60 >60 >60    LIVER FUNCTION TESTS: Recent Labs    12/03/18 1508  12/06/18 0540 12/08/18 0526 12/12/18 0545 12/18/18 0607 12/19/18 0520  BILITOT 0.6  --  0.4 0.5 0.6  --   --   AST 42*  --  21 39 22  --   --   ALT 39  --  23 25 22   --   --   ALKPHOS 73  --  62 70 82  --   --   PROT 6.3*  --  5.8* 5.8* 6.2*  --   --   ALBUMIN 2.2*   < > 1.6* 1.4* 1.5* 1.4* 1.5*   < > = values in this interval not displayed.    TUMOR MARKERS: No results for input(s): AFPTM, CEA, CA199, CHROMGRNA in the last 8760 hours.  Assessment and Plan:  Flu; PNAcomplicated course MI; CHF; cardiogenic shock Intubated;  trach Dysphagia Malnutrition Need for long term care Scheduled for percutaneous gastric tube placement Awaiting parents and son  to decide if want to move ahead.   Thank you for this interesting consult.  I greatly enjoyed meeting Antonio [name]. and look forward to participating in their care.  A copy of this report was sent to the requesting provider on this date.  Electronically Signed: Robet Leu, PA-C 12/22/2018, 1:26 PM   I spent a total of 40 Minutes    in face to face in clinical consultation, greater than 50% of which was counseling/coordinating care for percutaneous gastric tube placement

## 2018-12-22 NOTE — Progress Notes (Signed)
Physical Therapy Treatment Patient Details Name: Antonio RichardsDaniel Watson Manes Jr. MRN: [id number] DOB: [birth year] Today's Date: 12/22/2018    History of Present Illness 63 yo admitted with flu A and PNA with acute respiratory failure intubated 1/4, trach 1/16 with course complicated by acute MI, CHF and cardiogenic shock and mucous plugging. PMhx: DM, HTN, HLD, CAD, Lt BKA, rt toe amputation    PT Comments    Pt with eyes closed on arrival after hydrotherapy but able to arouse and respond to commands and agreeable to mobility. PT able to recognize weakness but very willing to sit EOB, use stedy to stand and pivot to chair today. Prosthesis remains absent from room to address further standing. Pt on vent PRVC 40% with SpO2 88-98% with RR 24-36 with cues for coughing during session and pt able to use yonker with assist x 2. Pt educated for RLE HEP and encouraged mobility with nursing. Pt on geomat and recommended 1hr OOB with frequent transitions for pressure relief. RN aware of lift for return to bed.    Follow Up Recommendations  LTACH;Supervision/Assistance - 24 hour     Equipment Recommendations  Other (comment)(defer to next venue)    Recommendations for Other Services       Precautions / Restrictions Precautions Precautions: Fall Precaution Comments: cortrak, vent, trach, L BKA Restrictions Weight Bearing Restrictions: No    Mobility  Bed Mobility Overal bed mobility: Needs Assistance Bed Mobility: Rolling;Sidelying to Sit Rolling: Min assist Sidelying to sit: Min assist;+2 for safety/equipment       General bed mobility comments: min assist to roll to left with use of rail and Hand over hand assist, assist to bring leg off bed and initiate rise from surface, HOB 15 degrees  Transfers Overall transfer level: Needs assistance   Transfers: Sit to/from Stand Sit to Stand: Mod assist;+2 safety/equipment;From elevated surface;+2 physical assistance         General transfer  comment: mod assist to rise from elevated bed with use of Stedy, assist of belt and pad to rise with bil UE support on STedy and increased time. Initial attempt from regular height bed pt unable to clear surface. Obtained fully upright grossly 20 sec each standing trial. Pivot with stedy bed to chair with assist for lines and balance. PT with right lean on stedy due to sacral pain. preference for left lean in chair  Ambulation/Gait             General Gait Details: unable no prosthesis present   Stairs             Wheelchair Mobility    Modified Rankin (Stroke Patients Only)       Balance Overall balance assessment: Needs assistance Sitting-balance support: No upper extremity supported;Feet supported Sitting balance-Leahy Scale: Fair   Postural control: Right lateral lean Standing balance support: Bilateral upper extremity supported Standing balance-Leahy Scale: Poor Standing balance comment: bil UE support on stedy with right knee blocked by pad for standing                            Cognition Arousal/Alertness: Awake/alert   Overall Cognitive Status: Difficult to assess                         Following Commands: Follows one step commands consistently       General Comments: pt on vent, trach mouthing words and able to state need to  void but unaware of catheter      Exercises General Exercises - Lower Extremity Long Arc Quad: AROM;10 reps;Seated;Right    General Comments        Pertinent Vitals/Pain Faces Pain Scale: Hurts little more Pain Location: right shoulder with movement Pain Descriptors / Indicators: Aching;Grimacing Pain Intervention(s): Limited activity within patient's tolerance;Monitored during session;Repositioned;Premedicated before session    Home Living                      Prior Function            PT Goals (current goals can now be found in the care plan section) Progress towards PT goals:  Progressing toward goals    Frequency    Min 3X/week      PT Plan Discharge plan needs to be updated    Co-evaluation              AM-PAC PT "6 Clicks" Mobility   Outcome Measure  Help needed turning from your back to your side while in a flat bed without using bedrails?: A Little Help needed moving from lying on your back to sitting on the side of a flat bed without using bedrails?: A Little Help needed moving to and from a bed to a chair (including a wheelchair)?: A Lot Help needed standing up from a chair using your arms (e.g., wheelchair or bedside chair)?: A Lot Help needed to walk in hospital room?: Total Help needed climbing 3-5 steps with a railing? : Total 6 Click Score: 12    End of Session Equipment Utilized During Treatment: Gait belt Activity Tolerance: Patient tolerated treatment well Patient left: in chair;with call bell/phone within reach;with chair alarm set;Other (comment)(geomat) Nurse Communication: Mobility status;Need for lift equipment PT Visit Diagnosis: Other abnormalities of gait and mobility (R26.89);Muscle weakness (generalized) (M62.81);Unsteadiness on feet (R26.81)     Time: 5883-2549 PT Time Calculation (min) (ACUTE ONLY): 39 min  Charges:  $Therapeutic Activity: 38-52 mins                     Antonio [name], PT Acute Rehabilitation Services Pager: [phone] Office: [phone]    Antonio [name] 12/22/2018, 11:37 AM

## 2018-12-23 ENCOUNTER — Inpatient Hospital Stay (HOSPITAL_COMMUNITY): Payer: Medicaid Other

## 2018-12-23 ENCOUNTER — Encounter (HOSPITAL_COMMUNITY): Payer: Self-pay | Admitting: Interventional Radiology

## 2018-12-23 HISTORY — PX: IR GASTROSTOMY TUBE MOD SED: IMG625

## 2018-12-23 LAB — BASIC METABOLIC PANEL
Anion gap: 9 (ref 5–15)
BUN: 22 mg/dL — ABNORMAL HIGH (ref 6–20)
CO2: 25 mmol/L (ref 22–32)
Calcium: 8.2 mg/dL — ABNORMAL LOW (ref 8.9–10.3)
Chloride: 104 mmol/L (ref 98–111)
Creatinine, Ser: 0.86 mg/dL (ref 0.61–1.24)
GFR calc non Af Amer: >60 mL/min (ref >60)
Glucose, Bld: 118 mg/dL — ABNORMAL HIGH (ref 70–99)
Potassium: 3.8 mmol/L (ref 3.5–5.1)
SODIUM: 138 mmol/L (ref 135–145)

## 2018-12-23 LAB — PROTIME-INR
INR: 1.26
Prothrombin Time: 15.6 seconds — ABNORMAL HIGH (ref 11.4–15.2)

## 2018-12-23 LAB — GLUCOSE, CAPILLARY
Glucose-Capillary: 100 mg/dL — ABNORMAL HIGH (ref 70–99)
Glucose-Capillary: 105 mg/dL — ABNORMAL HIGH (ref 70–99)
Glucose-Capillary: 110 mg/dL — ABNORMAL HIGH (ref 70–99)
Glucose-Capillary: 120 mg/dL — ABNORMAL HIGH (ref 70–99)
Glucose-Capillary: 122 mg/dL — ABNORMAL HIGH (ref 70–99)
Glucose-Capillary: 184 mg/dL — ABNORMAL HIGH (ref 70–99)

## 2018-12-23 MED ORDER — GLUCAGON HCL RDNA (DIAGNOSTIC) 1 MG IJ SOLR
INTRAMUSCULAR | Status: AC | PRN
  Administered 2018-12-23: 1 mg via INTRAVENOUS

## 2018-12-23 MED ORDER — VANCOMYCIN HCL IN DEXTROSE 1-5 GM/200ML-% IV SOLN
INTRAVENOUS | Status: AC
  Administered 2018-12-23: 1000 mg
  Filled 2018-12-23: qty 200

## 2018-12-23 MED ORDER — DAKINS (1/4 STRENGTH) 0.125 % EX SOLN
Freq: Two times a day (BID) | CUTANEOUS | Status: AC
  Administered 2018-12-24 (×2)
  Administered 2018-12-25: 1
  Administered 2018-12-25: 22:00:00
  Administered 2018-12-26 (×2): 1
  Administered 2018-12-27 (×2)
  Administered 2018-12-28: 1
  Administered 2018-12-28: 09:00:00
  Administered 2018-12-29: 1
  Administered 2018-12-29: 10:00:00
  Administered 2018-12-30: 1
  Administered 2018-12-30: 11:00:00
  Filled 2018-12-23 (×2): qty 473

## 2018-12-23 MED ORDER — FENTANYL CITRATE (PF) 100 MCG/2ML IJ SOLN
INTRAMUSCULAR | Status: AC
  Filled 2018-12-23: qty 2

## 2018-12-23 MED ORDER — GLUCAGON HCL RDNA (DIAGNOSTIC) 1 MG IJ SOLR
INTRAMUSCULAR | Status: AC
  Filled 2018-12-23: qty 1

## 2018-12-23 MED ORDER — IOPAMIDOL (ISOVUE-300) INJECTION 61%
INTRAVENOUS | Status: AC
  Administered 2018-12-23: 15 mL
  Filled 2018-12-23: qty 50

## 2018-12-23 MED ORDER — MIDAZOLAM HCL 2 MG/2ML IJ SOLN
INTRAMUSCULAR | Status: AC
  Filled 2018-12-23: qty 2

## 2018-12-23 MED ORDER — MIDAZOLAM HCL 2 MG/2ML IJ SOLN
INTRAMUSCULAR | Status: AC | PRN
  Administered 2018-12-23: 1 mg via INTRAVENOUS
  Administered 2018-12-23: 0.5 mg via INTRAVENOUS

## 2018-12-23 MED ORDER — LIDOCAINE HCL 1 % IJ SOLN
INTRAMUSCULAR | Status: AC
  Filled 2018-12-23: qty 20

## 2018-12-23 MED ORDER — FENTANYL CITRATE (PF) 100 MCG/2ML IJ SOLN
INTRAMUSCULAR | Status: AC | PRN
  Administered 2018-12-23: 50 ug via INTRAVENOUS
  Administered 2018-12-23: 25 ug via INTRAVENOUS

## 2018-12-23 MED ORDER — LIDOCAINE HCL 1 % IJ SOLN
INTRAMUSCULAR | Status: AC | PRN
  Administered 2018-12-23: 10 mL

## 2018-12-23 MED ORDER — LIDOCAINE-EPINEPHRINE (PF) 1 %-1:200000 IJ SOLN
INTRAMUSCULAR | Status: AC
  Filled 2018-12-23: qty 30

## 2018-12-23 NOTE — NC FL2 (Signed)
[place] LEVEL OF CARE SCREENING TOOL     IDENTIFICATION  Patient Name: Antonio [name]. Birthdate: 04/17/1956 Sex: male Admission Date (Current Location): 11/28/2018  [facility] and [place] Number:  Herbalist and Address:  [facility] [place], [place] [address] [employer], [place] [address]      Provider Number: [id number]  Attending Physician Name and Address:  Juanito Doom, MD  Relative Name and Phone Number:  Tsuneo, Faison [phone]     Current Level of Care: Hospital Recommended Level of Care: Vent SNF Prior Approval Number:    Date Approved/Denied:   PASRR Number: [id number]  Discharge Plan: SNF([facility] SNF)    Current Diagnoses: Patient Active Problem List   Diagnosis Date Noted  . Acute pulmonary edema (HCC)   . Hypoxia   . Tracheostomy status ([place])   . Pneumonia   . Influenza A virus present   . Pressure injury of skin 12/01/2018  . SIRS (systemic inflammatory response syndrome) ([place]) 11/28/2018  . Acute respiratory failure with hypoxemia ([place]) 11/28/2018  . Influenza A   . Hyponatremia   . Chest pain 05/02/2018  . Hyperlipidemia 05/02/2018  . Tobacco abuse 05/02/2018  . Coronary artery disease involving native coronary artery of native heart with angina pectoris ([place]) 05/02/2018  . S/P transmetatarsal amputation of foot, left ([place]) 04/15/2017  . Amputated great toe of right foot ([place]) 04/15/2017  . Ulcer of toe of left foot, limited to breakdown of skin ([place]) 04/04/2017  . 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 Edema     Orientation RESPIRATION BLADDER Height & Weight        Vent(6 mm shiley cuffed trach, FiO2 40%) Incontinent, External catheter Weight: 96.5 kg Height:  6' (182.9 cm)  BEHAVIORAL SYMPTOMS/MOOD NEUROLOGICAL BOWEL NUTRITION STATUS      Incontinent Feeding tube  AMBULATORY STATUS COMMUNICATION OF  NEEDS Skin   Total Care Non-Verbally PU Stage and Appropriate Care(Pressure Injury: Unstagable (head left posterior). Stage I (sacrum). Change dressing on sacrum daily & PRN for head. ) PU Stage 1 Dressing: Daily(Sacrum - daily dressing change)                     Personal Care Assistance Level of Assistance  Bathing, Feeding, Dressing, Total care Bathing Assistance: Maximum assistance Feeding assistance: Maximum assistance Dressing Assistance: Maximum assistance Total Care Assistance: Maximum assistance   Functional Limitations Info  Sight, Hearing, Speech Sight Info: Adequate Hearing Info: Adequate Speech Info: Adequate    SPECIAL CARE FACTORS FREQUENCY  PT (By licensed PT), OT (By licensed OT), Speech therapy     PT Frequency: 5x/week OT Frequency: 5x/week     Speech Therapy Frequency: 2x/week      Contractures      Additional Factors Info  Code Status, Allergies, Psychotropic, Insulin Sliding Scale Code Status Info: Full Allergies Info: Iodinated Diagnostic Agents, Penicillins Psychotropic Info: Seroquel 235m Insulin Sliding Scale Info: 2x/daily and every 4 hours       Current Medications (12/23/2018):  This is the current hospital active medication list Current Facility-Administered Medications  Medication Dose Route Frequency Provider Last Rate Last Dose  . lidocaine-EPINEPHrine (XYLOCAINE-EPINEPHrine) 1 %-1:200000 (PF) injection           . 0.9 %  sodium chloride infusion   Intravenous PRN SJennelle HumanB, NP 5 mL/hr at 12/19/18 1726    .  acetaminophen (TYLENOL) solution 500 mg  500 mg Per Tube Q6H PRN Cecilie Lowers T, MD   500 mg at 12/19/18 1157  . albuterol (PROVENTIL) (2.5 MG/3ML) 0.083% nebulizer solution 2.5 mg  2.5 mg Nebulization Q2H PRN Simonne Maffucci B, MD   2.5 mg at 12/20/18 1000  . aspirin chewable tablet 81 mg  81 mg Per Tube Daily Aldean Jewett, MD   81 mg at 12/23/18 0915  . atorvastatin (LIPITOR) tablet 40 mg  40 mg Oral q1800 Aldean Jewett, MD   40 mg at 12/22/18 1720  . bethanechol (URECHOLINE) tablet 10 mg  10 mg Per Tube QID Erick Colace, NP   10 mg at 12/23/18 0915  . bisacodyl (DULCOLAX) suppository 10 mg  10 mg Rectal Daily PRN Frederik Pear, MD   10 mg at 12/10/18 0039  . carvedilol (COREG) tablet 3.125 mg  3.125 mg Oral BID WC Lelon Perla, MD   3.125 mg at 12/23/18 0915  . chlorhexidine gluconate (MEDLINE KIT) (PERIDEX) 0.12 % solution 15 mL  15 mL Mouth Rinse BID Simonne Maffucci B, MD   15 mL at 12/22/18 2036  . Chlorhexidine Gluconate Cloth 2 % PADS 6 each  6 each Topical Daily Aldean Jewett, MD   6 each at 12/22/18 1800  . clonazePAM (KLONOPIN) tablet 0.5 mg  0.5 mg Per Tube BID Simonne Maffucci B, MD   0.5 mg at 12/23/18 0915  . cloNIDine (CATAPRES) tablet 0.1 mg  0.1 mg Oral Q12H Wynell Balloon, RPH   0.1 mg at 12/23/18 0416   Followed by  . [START ON 12/24/2018] cloNIDine (CATAPRES) tablet 0.1 mg  0.1 mg Oral Q24H Wynell Balloon, RPH      . docusate (COLACE) 50 MG/5ML liquid 100 mg  100 mg Per Tube BID PRN Noe Gens L, NP   100 mg at 12/09/18 1709  . [START ON 12/24/2018] enoxaparin (LOVENOX) injection 40 mg  40 mg Subcutaneous Daily Monia Sabal, PA-C      . famotidine (PEPCID) 40 MG/5ML suspension 20 mg  20 mg Per Tube BID Jennelle Human B, NP   20 mg at 12/23/18 0912  . feeding supplement (PRO-STAT SUGAR FREE 64) liquid 60 mL  60 mL Per Tube BID Kloefkorn, [place], MD   60 mL at 12/23/18 0916  . feeding supplement (VITAL 1.5 CAL) liquid 1,000 mL  1,000 mL Per Tube Continuous Mannam, Praveen, MD 55 mL/hr at 12/20/18 1823    . fentaNYL (DURAGESIC - dosed mcg/hr) 75 mcg  75 mcg Transdermal Q72H Erick Colace, NP   1 patch at 12/22/18 1527  . fentaNYL (SUBLIMAZE) 100 MCG/2ML injection           . fentaNYL (SUBLIMAZE) injection 50-75 mcg  50-75 mcg Intravenous Q3H PRN Deterding, Guadelupe Sabin, MD   75 mcg at 12/23/18 0004  . fentaNYL (SUBLIMAZE) injection   Intravenous PRN Sandi Mariscal, MD    25 mcg at 12/23/18 1531  . folic acid (FOLVITE) tablet 1 mg  1 mg Per Tube Daily Masters, Jake Church, RPH   1 mg at 12/23/18 0915  . free water 300 mL  300 mL Per Tube Q6H Jennelle Human B, NP   300 mL at 12/23/18 1151  . glucagon (human recombinant) (GLUCAGEN) 1 MG injection           . glucagon (human recombinant) (GLUCAGEN) injection    PRN Sandi Mariscal, MD   1 mg at 12/23/18 1525  .  guaiFENesin (ROBITUSSIN) 100 MG/5ML solution 100 mg  5 mL Per Tube Q12H Magdalen Spatz, NP   100 mg at 12/23/18 0916  . ibuprofen (ADVIL,MOTRIN) 100 MG/5ML suspension 400 mg  400 mg Per Tube Q8H PRN Elsie Lincoln, MD   400 mg at 12/20/18 0818  . insulin aspart (novoLOG) injection 0-20 Units  0-20 Units Subcutaneous Q4H Elsie Lincoln, MD   3 Units at 12/23/18 [phone]  . insulin detemir (LEVEMIR) injection 45 Units  45 Units Subcutaneous BID Corey Harold, NP   45 Units at 12/22/18 2135  . ipratropium-albuterol (DUONEB) 0.5-2.5 (3) MG/3ML nebulizer solution 3 mL  3 mL Nebulization TID Jennelle Human B, NP   3 mL at 12/23/18 1422  . LORazepam (ATIVAN) injection 1 mg  1 mg Intravenous Q8H PRN Collene Gobble, MD   1 mg at 12/23/18 1148  . losartan (COZAAR) tablet 25 mg  25 mg Oral Daily Minus Breeding, MD   25 mg at 12/23/18 0916  . MEDLINE mouth rinse  15 mL Mouth Rinse 10 times per day Simonne Maffucci B, MD   15 mL at 12/23/18 0505  . midazolam (VERSED) 2 MG/2ML injection           . midazolam (VERSED) injection   Intravenous PRN Sandi Mariscal, MD   0.5 mg at 12/23/18 1531  . multivitamin liquid 15 mL  15 mL Oral Daily Mannam, Praveen, MD   15 mL at 12/23/18 0915  . polyethylene glycol (MIRALAX / GLYCOLAX) packet 17 g  17 g Per Tube Daily PRN Jennelle Human B, NP      . QUEtiapine (SEROQUEL) tablet 200 mg  200 mg Per Tube BID Simonne Maffucci B, MD   200 mg at 12/23/18 0914  . sodium chloride flush (NS) 0.9 % injection 10-40 mL  10-40 mL Intracatheter Q12H Aldean Jewett, MD   10 mL at 12/22/18 2136  . sodium chloride  flush (NS) 0.9 % injection 10-40 mL  10-40 mL Intracatheter PRN Aldean Jewett, MD   40 mL at 12/09/18 0232  . [START ON 12/24/2018] sodium hypochlorite (DAKIN'S 1/4 STRENGTH) topical solution   Irrigation BID Simonne Maffucci B, MD      . thiamine (VITAMIN B-1) tablet 100 mg  100 mg Per Tube Daily Harvel Quale, RPH   100 mg at 12/23/18 1517     Discharge Medications: Please see discharge summary for a list of discharge medications.  Relevant Imaging Results:  Relevant Lab Results:   Additional Information SSN: [id number]  Estanislado Emms, LCSW

## 2018-12-23 NOTE — Progress Notes (Signed)
Pt transported from 4N29 to IR and back without incident.

## 2018-12-23 NOTE — Progress Notes (Signed)
NAME:  Antonio Beckom., MRN:  [id number], DOB:  10-17-1956, LOS: 7 ADMISSION DATE:  11/28/2018, CONSULTATION DATE:  1/3 REFERRING MD:  EDP, CHIEF COMPLAINT:  Dyspnea   Brief History   63 year old male with influenza A causing respiratory failure and cardiogenic shock.  Prolonged mechanical ventilation, tracheostomy placed on January 16.   Past Medical History  DM2, CAD, HTN  Significant Hospital Events   1/6 self extubated 1/7 re-intubated 1/8 self extubated 1/9 acute respiratory failure, mucus plugging right lower lobe, re-intubated, bronchoscopy 1/10 New fever T Max 103.1, antibiotics resumed with ceftaz and vancomycin 1/11: Hemodynamically seems to be improving. Very hypernatremic. X-ray improving. Adding free water, one-time Lasix, follow-up chemistry a.m. Hoping to initiate weaning efforts on 1/12 1/16. Continuing diuresis and ongoing ventilatory support. Working on weaning but still has significant work of breathing. Plan has been to proceed with tracheostomy. Hopefully we can continue ongoing diuresis and continue to decrease sedation 1/20 still struggling to wean. Secretions and anxiety are barriers.  1/21 trach revision to 6 distal XLT, fever, increased secretions, abx broadened / bronch 1/22 ongoing thick secretions 1/24 Failed SBT, low grade fever, Per CXR persistent pleural effusions / basilar consolidation atelectasis vs pneumonia per bases 1/25>> Mucus plugged with slow recovery after chest PT and Wound Care  Consults:  PCCM Cardiology   Procedures:  ETT 1/3 >>1/6, 1/7 > 1/8> 1/9>> 1/16 1/5 Right TL PICC >> 1/9 Bronch>> Mucus plugging RLL Trach 1/16 (JY) >>1/20 trach change failed and #4 cuffed 1/21 trach upsized to 6 cuffed XLT distal  1/17 right nare cortrak >>  Significant Diagnostic Tests:  UDS 1/4 >>negative  ECHO 1/4 >>LVEF 20-25%, akinesis of the anteroseptal, anterolateral & apical myocardium, LVEF 08-14%, grade 1 diastolic  dysfunction, small pericardial effusion without evidence of hemodynamic compromise CT Chest / ABD / Pelvis 1/3 >> airway thickening, motion artifact, CAD CXR 1/14>bilateral opacities, associated small pleural effusion   Micro Data:  RVP 1/3 >> positive for influenza A BCx2 1/3 >>No growth UC 1/4 >>No Growth Sputum Cx 1/7 >> Normal respiratory flora BAL >>corynebacterium  Blood Cx 1/10 > neg Sputum 1/21 > abundant corynebacterium striatum  Antimicrobials:  Vanco 1/3 x1;    Zosyn 1/30 x1  Tamiflu 1/3 >1/8 1/10 vancomycin>1/12 1/10 ceftaz >1/12;   1/21 >>1/22 1/12 emycin > 1/22 1/23 cefepime >> 1/24 1/21 Vanc> 1/28  Interim history/subjective:  Sat up on side of bed and got to chair yesterday with PT Still has thick secretions Weaned 7 hours  Objective   Blood pressure 126/77, pulse 92, temperature 98.8 F (37.1 C), temperature source Oral, resp. rate (!) 22, height 6' (1.829 m), weight 96.5 kg, SpO2 98 %.    Vent Mode: PRVC FiO2 (%):  [40 %] 40 % Set Rate:  [22 bmp] 22 bmp Vt Set:  [620 mL] 620 mL PEEP:  [5 cmH20] 5 cmH20 Pressure Support:  [10 cmH20] 10 cmH20 Plateau Pressure:  [13 cmH20-22 cmH20] 18 cmH20   Intake/Output Summary (Last 24 hours) at 12/23/2018 0812 Last data filed at 12/22/2018 1600 Gross per 24 hour  Intake 372.75 ml  Output 15 ml  Net 357.75 ml   Filed Weights   12/17/18 0500 12/18/18 0406 12/19/18 0500  Weight: 94.7 kg 96 kg 96.5 kg    Examination:  General:  In bed on vent HENT: NCAT trach in place PULM: CTA B, vent supported breathing CV: RRR, no mgr GI: BS+, soft, nontender MSK: normal bulk and tone Neuro: awake on  vent, following commands     Resolved Hospital Problem list   AKI Pneumonia Hypokalemia Hypernatremia   Assessment & Plan:  Acute respiratory failure with hypoxemia: still having problems with thick secretions, to some degree I think this is predominantly the natural history of pneumonia/Influenza and will  clear up over the next few weeks, he has improved significantly overall > stopped antibiotics on 1/28 > SBT as long as tolerated on 1/28 > hopeful for ATC on 1/29 if secretions improved > pulmonary toilette measures  Tracheostomy status > continue trach care per routine  Acute systolic heart failure > repeat echo pending  Nutrition: > PEG ordered > continue tube feeding  Acute metabolic encephalopathy : dramatically improved, precedex off > continue seroquel > maintain low dose clonazepam today > wean clonidine per pharmacy protocol  DM2 > continue detemir/SSI  Constipation > continue bowel regimen  Normocytic anemia without bleeding > monitor for bleeding   Best practice:  Diet: tube feeding Pain/Anxiety/Delirium protocol (if indicated): as above, RASS goal 0 VAP protocol (if indicated): yes DVT prophylaxis: lovenox GI prophylaxis: famoitidine Glucose control: SSI/Detemir Mobility: PT working with him Code Status: Full Family Communication: none bedside Disposition: Doesn't have LTAC benefits, weaning well this week so hoping to avoid vent-SNF  Labs   CBC: Recent Labs  Lab 12/17/18 0506 12/18/18 0641 12/19/18 0520 12/20/18 0500 12/21/18 0500 12/22/18 0446  WBC 10.5 8.1 6.5 8.7 7.7 10.1  NEUTROABS 8.6*  --   --   --   --   --   HGB 9.9* 9.0* 8.8* 9.0* 8.8* 9.3*  HCT 33.1* 29.9* 30.3* 29.9* 29.1* 30.1*  MCV 93.5 95.5 96.2 94.6 94.2 91.5  PLT 388 362 354 344 345 431    Basic Metabolic Panel: Recent Labs  Lab 12/17/18 0506 12/18/18 0607 12/19/18 0520 12/20/18 0500 12/21/18 0500 12/22/18 0446 12/23/18 0531  NA 146* 150* 149* 144 144 142 138  K 3.9 3.4* 3.7 3.6 4.0 3.7 3.8  CL 106 118* 117* 110 110 108 104  CO2 _0 GLUCOSE 220* 90 165* 72 140* 81 118*  BUN 40* 33* 32* 26* 24* 24* 22*  CREATININE 1.25* 0.99 1.00 0.80 0.83 0.79 0.86  CALCIUM 8.7* 7.9* 8.2* 8.2* 8.3* 8.3* 8.2*  MG 2.4  --  2.5* 2.3 2.3 2.2  --   PHOS 5.0* 3.6  4.6  4.6 4.7* 4.6  --   --    GFR: Estimated Creatinine Clearance: 110.1 mL/min (by C-G formula based on SCr of 0.86 mg/dL). Recent Labs  Lab 12/19/18 0520 12/20/18 0500 12/21/18 0500 12/22/18 0446  WBC 6.5 8.7 7.7 10.1    Liver Function Tests: Recent Labs  Lab 12/18/18 0607 12/19/18 0520  ALBUMIN 1.4* 1.5*   No results for input(s): LIPASE, AMYLASE in the last 168 hours. No results for input(s): AMMONIA in the last 168 hours.  ABG    Component Value Date/Time   PHART 7.282 (L) 12/07/2018 0349   PCO2ART 50.2 (H) 12/07/2018 0349   PO2ART 59.0 (L) 12/07/2018 0349   HCO3 23.4 12/07/2018 0349   TCO2 25 12/07/2018 0349   ACIDBASEDEF 3.0 (H) 12/07/2018 0349   O2SAT 84.0 12/07/2018 0349     Coagulation Profile: Recent Labs  Lab 12/23/18 0531  INR 1.26    Cardiac Enzymes: No results for input(s): CKTOTAL, CKMB, CKMBINDEX, TROPONINI in the last 168 hours.  HbA1C: Hgb A1c MFr Bld  Date/Time Value Ref Range Status  03/25/2017 05:59 AM 12.3 (  H) 4.8 - 5.6 % Final    Comment:    (NOTE)         Pre-diabetes: 5.7 - 6.4         Diabetes: >6.4         Glycemic control for adults with diabetes: <7.0   12/28/2015 08:50 PM 11.7 (H) 4.8 - 5.6 % Final    Comment:    (NOTE)         Pre-diabetes: 5.7 - 6.4         Diabetes: >6.4         Glycemic control for adults with diabetes: <7.0     CBG: Recent Labs  Lab 12/22/18 1206 12/22/18 1608 12/22/18 2020 12/22/18 2355 12/23/18 0404  GLUCAP 116* 213* 172* 120* 122*     Critical care time: 34 minutes    Roselie Awkward, MD [facility] Pager: [phone] Cell: [phone] If no response, call [phone]

## 2018-12-23 NOTE — Progress Notes (Signed)
Occupational Therapy Treatment Patient Details Name: Antonio [name]. MRN: [id number] DOB: 06-26-1956 Today's Date: 12/23/2018    History of present illness 63 yo admitted with flu A and PNA with acute respiratory failure intubated 1/4, trach 1/16 with course complicated by acute MI, CHF and cardiogenic shock and mucous plugging. PMhx: DM, HTN, HLD, CAD, Lt BKA, rt toe amputation   OT comments  Patient in bed and eager for mobility upon entering.  Patient with his LEs over bed rail, max cueing throughout session due to increased impulsively.  Bed mobility once lines organized with min assist +2, requires min assist sitting EOB balance. Patient verbalizing need to use bathroom and attempted 3:1 commode transfer using stedy with max assist +2, but R lateral lean, impulsivity and poor awareness limiting safety and deferred transfer.  Will continue to follow.    Follow Up Recommendations  CIR;Supervision/Assistance - 24 hour    Equipment Recommendations  Other (comment)(TBD at next venue of care)    Recommendations for Other Services Rehab consult    Precautions / Restrictions Precautions Precautions: Fall Precaution Comments: cortrak, vent, trach, L BKA Restrictions Weight Bearing Restrictions: No       Mobility Bed Mobility Overal bed mobility: Needs Assistance Bed Mobility: Rolling;Sidelying to Sit;Sit to Sidelying;Supine to Sit Rolling: Min assist Sidelying to sit: Min assist;+2 for safety/equipment Supine to sit: Mod assist   Sit to sidelying: Min assist;+2 for physical assistance;+2 for safety/equipment General bed mobility comments: patient found with legs over rail, mod assist to transition to sitting then transitioned to supine to adjust lines; min assist to roll towards L side then min assist +2 to transition to sitting EOB  Transfers Overall transfer level: Needs assistance   Transfers: Sit to/from Stand Sit to Stand: Max assist;+2 physical assistance;+2  safety/equipment         General transfer comment: max assist +2 to stand and transition into stedy over multiple trials, increased R lateral lean and unsafe to complete transfer to [facility]     Balance Overall balance assessment: Needs assistance Sitting-balance support: No upper extremity supported;Feet supported Sitting balance-Leahy Scale: Fair Sitting balance - Comments: min assist to maintain balance EOB with 0-2 hand support Postural control: Right lateral lean Standing balance support: Bilateral upper extremity supported Standing balance-Leahy Scale: Poor Standing balance comment: B UE support on stedy with R lateral lean, reliant on external support                           ADL either performed or assessed with clinical judgement   ADL Overall ADL's : Needs assistance/impaired Eating/Feeding: NPO   Grooming: Min guard;Wash/dry face;Bed level Grooming Details (indicate cue type and reason): using L hand to wash face after EOB activity                   Toilet Transfer Details (indicate cue type and reason): sit to stand +2 attempting to use stedy, unable due to impulsivity and R lateral lean          Functional mobility during ADLs: Maximal assistance;+2 for physical assistance;+2 for safety/equipment;Cueing for safety;Cueing for sequencing General ADL Comments: pt voicing need to use bathroom, attempted BSC using stedy but unable to safety transition to stedy due to R lateral lean and impulsivity      Vision       Perception     Praxis      Cognition Arousal/Alertness: Awake/alert Behavior During Therapy: Restless;Impulsive  Overall Cognitive Status: Difficult to assess Area of Impairment: Safety/judgement;Following commands                       Following Commands: Follows one step commands with increased time;Follows one step commands inconsistently Safety/Judgement: Decreased awareness of safety;Decreased awareness of deficits      General Comments: pt on vent, trach mouthing words throughout session but difficult to understand.  found with legs over bed rails, impulsively attempting to get OOB with max cueing to safety engage in EOB activities        Exercises     Shoulder Instructions       General Comments      Pertinent Vitals/ Pain       Pain Assessment: Faces Faces Pain Scale: No hurt  Home Living                                          Prior Functioning/Environment              Frequency  Min 2X/week        Progress Toward Goals  OT Goals(current goals can now be found in the care plan section)  Progress towards OT goals: Progressing toward goals  Acute Rehab OT Goals Patient Stated Goal: to rest OT Goal Formulation: With patient Time For Goal Achievement: 12/27/18 Potential to Achieve Goals: Good  Plan Discharge plan remains appropriate;Frequency remains appropriate    Co-evaluation                 AM-PAC OT "6 Clicks" Daily Activity     Outcome Measure   Help from another person eating meals?: Total Help from another person taking care of personal grooming?: A Little Help from another person toileting, which includes using toliet, bedpan, or urinal?: A Lot Help from another person bathing (including washing, rinsing, drying)?: Total Help from another person to put on and taking off regular upper body clothing?: Total Help from another person to put on and taking off regular lower body clothing?: Total 6 Click Score: 9    End of Session Equipment Utilized During Treatment: Oxygen;Other (comment);Gait belt(vent)  OT Visit Diagnosis: Unsteadiness on feet (R26.81);Other abnormalities of gait and mobility (R26.89);Muscle weakness (generalized) (M62.81);Other symptoms and signs involving cognitive function   Activity Tolerance Patient tolerated treatment well;Patient limited by fatigue   Patient Left in bed;with call bell/phone within reach;with bed  alarm set   Nurse Communication Mobility status        Time: 1349-1414 OT Time Calculation (min): 25 min  Charges: OT General Charges $OT Visit: 1 Visit OT Treatments $Self Care/Home Management : 23-37 mins  Chancy Milroyhristie S Karima Carrell, OT Acute Rehabilitation Services Pager [phone] Office [phone]    Chancy MilroyChristie S Cashtyn Pouliot 12/23/2018, 3:26 PM

## 2018-12-23 NOTE — Consult Note (Signed)
WOC Nurse wound follow up Wound type: Unstageable pressure injury Measurement: 9cm x 5cm  x 0.1cm  Wound bed: 95% yellow/5% red tissue Drainage (amount, consistency, odor) moderate, bright green today, no significant  Periwound: intact, blanchable tissue Dressing procedure/placement/frequency:   Start Dakin's solution for new drainage changes, will serve as non-selective debridement until drainage has changed.  Notified PT of change Will hold Santyl for now.  Low air loss mattress in place on ICU bed Nutrition being maximized by RN via Cortrax   WOC Nurse team will follow with you and see patient within 10 days for wound assessments.  Please notify WOC nurses of any acute changes in the wounds or any new areas of concern Antonio Wollin [facility] MSN, RN,CWOCN, CNS, CWON-AP [phone]

## 2018-12-23 NOTE — Procedures (Signed)
Pre procedure Dx: Dysphagia Post Procedure Dx: Same  Successful fluoroscopic guided insertion of gastrostomy tube.   The gastrostomy tube may be used immediately for medications.   Tube feeds may be initiated in 24 hours as per the primary team.    EBL: None Complications: None immediate  Antonio Lynard Postlewait, MD Pager #: [phone]    

## 2018-12-23 NOTE — Progress Notes (Addendum)
Hydrotherapy Cancellation Note  Patient Details Name: Antonio [name]. MRN: [id number] DOB: 09/16/1956   Cancelled Treatment:    Reason Eval/Treat Not Completed: Patient declined, no reason specified. Pt adamantly refusing despite encouragement of staff and family. Pt denies pain but continued to refuse. Will re-attempt tomorrow.   Angelina Ok [facility] 12/23/2018, 11:02 AM Skip Mayer PT Acute Rehabilitation Services Pager [phone] Office [phone]

## 2018-12-23 NOTE — Progress Notes (Signed)
Call from Beckey Downing in IR. Pt is supposed to have been NPO after MN. His tube feeding was not turned off. I turned it off at 0815.

## 2018-12-23 NOTE — Progress Notes (Signed)
Pt taken to IR holding area by RN and RT, at which time I gave report to IR RN.

## 2018-12-23 NOTE — CV Procedure (Signed)
Echocardiogram not completed, patient is undergoing physical therapy.  Leta Jungling RDCS

## 2018-12-24 ENCOUNTER — Inpatient Hospital Stay (HOSPITAL_COMMUNITY): Payer: Medicaid Other

## 2018-12-24 DIAGNOSIS — J9601 Acute respiratory failure with hypoxia: Secondary | ICD-10-CM

## 2018-12-24 LAB — GLUCOSE, CAPILLARY
GLUCOSE-CAPILLARY: 90 mg/dL (ref 70–99)
Glucose-Capillary: 138 mg/dL — ABNORMAL HIGH (ref 70–99)
Glucose-Capillary: 155 mg/dL — ABNORMAL HIGH (ref 70–99)
Glucose-Capillary: 208 mg/dL — ABNORMAL HIGH (ref 70–99)
Glucose-Capillary: 212 mg/dL — ABNORMAL HIGH (ref 70–99)
Glucose-Capillary: 63 mg/dL — ABNORMAL LOW (ref 70–99)

## 2018-12-24 LAB — ECHOCARDIOGRAM COMPLETE
HEIGHTINCHES: 72 in
WEIGHTICAEL: 3456.81 [oz_av]

## 2018-12-24 MED ORDER — PERFLUTREN LIPID MICROSPHERE
1.0000 mL | INTRAVENOUS | Status: AC | PRN
  Administered 2018-12-24: 6 mL via INTRAVENOUS
  Filled 2018-12-24: qty 10

## 2018-12-24 MED ORDER — JUVEN PO PACK
1.0000 | PACK | Freq: Two times a day (BID) | ORAL | Status: DC
  Administered 2018-12-24 – 2019-02-12 (×100): 1
  Filled 2018-12-24 (×102): qty 1

## 2018-12-24 MED ORDER — DEXTROSE 50 % IV SOLN
INTRAVENOUS | Status: AC
  Administered 2018-12-24: 12.5 g
  Filled 2018-12-24: qty 50

## 2018-12-24 NOTE — Progress Notes (Signed)
Nutrition Follow-up  DOCUMENTATION CODES:   Not applicable  INTERVENTION:   Continue:  Vital 1.5 @ 55 ml/hr (1320 ml/day) via Cortrak tube 60 ml Prostat BID  Provides: 2380 kcal, 149 grams protein, and 1003 ml free water.  Total free water: 2203 ml   Add 1 package of Juven BID   NUTRITION DIAGNOSIS:   Inadequate oral intake related to inability to eat as evidenced by NPO status. Ongoing  GOAL:   Provide needs based on ASPEN/SCCM guidelines Met.   MONITOR:   Vent status, Labs, Skin, Weight trends, I & O's  ASSESSMENT:   63 y/o M who presented to [facility] 1/3 with fever, cough, chest pain & SOB. Pt found to be Influenza A positive. Developed progressive respiratory distress requiring intubation in the ER.    1/15 Cortrak placed (tip post-pyloric) 1/16 trach 1/28 g-tube placed by IR.   Pt discussed during ICU rounds and with RN.  Pt remains on vent support, plan to start trach collar trials today Pt receiving hydrotherapy for wounds   Patient is currently intubated on ventilator support MV: 13.6 L/min Temp (24hrs), Avg:99.5 F (37.5 C), Min:98.7 F (37.1 C), Max:100.5 F (38.1 C)  Labs and medications reviewed.  SSI, MVI, folic acid, levemir 45 units BID, thiamine 300 ml free water every 6 hours - 1200 ml  CBG:63-155 (off TF overnight)  MAP: 90   I/O: +2,369 ml since admit UOP: 1045 ml x 24 hrs    Diet Order:   Diet Order            Diet NPO time specified  Diet effective midnight              EDUCATION NEEDS:   Not appropriate for education at this time  Skin:  Skin Assessment: Skin Integrity Issues: Skin Integrity Issues:: Stage II, Unstageable, Stage I Stage I: head Stage II: sacrum now unstageable  Unstageable: sacrum   Last BM:  1/27 - small x 2  Height:   Ht Readings from Last 1 Encounters:  11/29/18 6' (1.829 m)    Weight:   Wt Readings from Last 1 Encounters:  12/24/18 98 kg    Ideal Body Weight:  76.1 kg(adjusted for L  BKA)  Estimated Nutritional Needs:   Kcal:  2385  Protein:  120-150 grams  Fluid:  > 2 L/day  Maylon Peppers RD, LDN, CNSC [phone] Pager [phone] After Hours Pager

## 2018-12-24 NOTE — Progress Notes (Addendum)
NAME:  Antonio Maday., MRN:  [id number], DOB:  1956-02-12, LOS: 70 ADMISSION DATE:  11/28/2018, CONSULTATION DATE:  1/3 REFERRING MD:  EDP, CHIEF COMPLAINT:  Dyspnea   Brief History   63 year old male with influenza A causing respiratory failure and cardiogenic shock.  Prolonged mechanical ventilation, tracheostomy placed on January 16.   Past Medical History  DM2, CAD, HTN  Significant Hospital Events   1/6 self extubated 1/7 re-intubated 1/8 self extubated 1/9 acute respiratory failure, mucus plugging right lower lobe, re-intubated, bronchoscopy 1/10 New fever T Max 103.1, antibiotics resumed with ceftaz and vancomycin 1/11: Hemodynamically seems to be improving. Very hypernatremic. X-ray improving. Adding free water, one-time Lasix, follow-up chemistry a.m. Hoping to initiate weaning efforts on 1/12 1/16. Continuing diuresis and ongoing ventilatory support. Working on weaning but still has significant work of breathing. Plan has been to proceed with tracheostomy. Hopefully we can continue ongoing diuresis and continue to decrease sedation 1/20 still struggling to wean. Secretions and anxiety are barriers.  1/21 trach revision to 6 distal XLT, fever, increased secretions, abx broadened / bronch 1/22 ongoing thick secretions 1/24 Failed SBT, low grade fever, Per CXR persistent pleural effusions / basilar consolidation atelectasis vs pneumonia per bases 1/25>> Mucus plugged with slow recovery after chest PT and Wound Care  Consults:  PCCM Cardiology   Procedures:  ETT 1/3 >>1/6, 1/7 > 1/8> 1/9>> 1/16 1/5 Right TL PICC >> 1/9 Bronch>> Mucus plugging RLL Trach 1/16 (JY) >>1/20 trach change failed and #4 cuffed 1/21 trach upsized to 6 cuffed XLT distal  1/17 right nare cortrak >> 1/29 1/28 gastrostomy tube >   Significant Diagnostic Tests:  UDS 1/4 >>negative  ECHO 1/4 >>LVEF 20-25%, akinesis of the anteroseptal, anterolateral & apical myocardium, LVEF  95-63%, grade 1 diastolic dysfunction, small pericardial effusion without evidence of hemodynamic compromise CT Chest / ABD / Pelvis 1/3 >> airway thickening, motion artifact, CAD CXR 1/14>bilateral opacities, associated small pleural effusion   Micro Data:  RVP 1/3 >> positive for influenza A BCx2 1/3 >>No growth UC 1/4 >>No Growth Sputum Cx 1/7 >> Normal respiratory flora BAL >>corynebacterium  Blood Cx 1/10 > neg Sputum 1/21 > abundant corynebacterium striatum  Antimicrobials:  Vanco 1/3 x1;    Zosyn 1/30 x1  Tamiflu 1/3 >1/8 1/10 vancomycin>1/12 1/10 ceftaz >1/12;   1/21 >>1/22 1/12 emycin > 1/22 1/23 cefepime >> 1/24 1/21 Vanc> 1/28  Interim history/subjective:  Awake, alert Had PEG yesterday Depressed today  Objective   Blood pressure (!) 148/75, pulse 90, temperature 98.7 F (37.1 C), temperature source Oral, resp. rate (!) 22, height 6' (1.829 m), weight 98 kg, SpO2 93 %. CVP:  [8 mmHg-9 mmHg] 9 mmHg  Vent Mode: PRVC FiO2 (%):  [40 %] 40 % Set Rate:  [22 bmp] 22 bmp Vt Set:  [620 mL] 620 mL PEEP:  [5 cmH20] 5 cmH20 Pressure Support:  [10 cmH20] 10 cmH20 Plateau Pressure:  [15 cmH20-25 cmH20] 25 cmH20   Intake/Output Summary (Last 24 hours) at 12/24/2018 0909 Last data filed at 12/24/2018 0700 Gross per 24 hour  Intake 120 ml  Output 1045 ml  Net -925 ml   Filed Weights   12/18/18 0406 12/19/18 0500 12/24/18 0500  Weight: 96 kg 96.5 kg 98 kg    Examination:  General:  In bed on vent HENT: NCAT trach in place PULM: CTA B, vent supported breathing CV: RRR, no mgr GI: BS+, soft, nontender, PEG In place MSK: normal bulk and tone Neuro:  awake, alert       Resolved Hospital Problem list   AKI Pneumonia Hypokalemia Hypernatremia   Assessment & Plan:  Acute respiratory failure with hypoxemia: improving week of 1/27 Continue trach care per routine > continue pressure support ventilation today > attempt trach collar trials today >  continue PT efforts > trach care per routine > continue pulmonary toilette measures   Acute systolic heart failure > r/u repeat echo > conitue coreg > if LV function improved then may be able to forgo left heart cath/right heart cath > lab holiday tomorrow  Nutrition: > resume tube feeding today > appreciate IR > PEG care per routine  Acute metabolic encephalopathy : dramatically improved, precedex off > continue low dose clonazepam, consider stopping/weaning 1/30 > wean off clonidine per routine  DM2 > continue SSI/Detemir  Constipation > continue bowel regimen  Normocytic anemia without bleeding > Monitor for bleeding > Transfuse PRBC for Hgb < 7 gm/dL   Best practice:  Diet: tube feeding Pain/Anxiety/Delirium protocol (if indicated): as above, RASS goal 0 VAP protocol (if indicated): yes DVT prophylaxis: lovenox GI prophylaxis: famotidine Glucose control: SSI/Detemir Mobility: PT working with him Code Status: Full Family Communication: Updated son Josh on 1/28 bedside Disposition: Doesn't have LTAC benefits, weaning well this week so hoping to avoid vent-SNF  Labs   CBC: Recent Labs  Lab 12/18/18 0641 12/19/18 0520 12/20/18 0500 12/21/18 0500 12/22/18 0446  WBC 8.1 6.5 8.7 7.7 10.1  HGB 9.0* 8.8* 9.0* 8.8* 9.3*  HCT 29.9* 30.3* 29.9* 29.1* 30.1*  MCV 95.5 96.2 94.6 94.2 91.5  PLT 362 354 344 345 001    Basic Metabolic Panel: Recent Labs  Lab 12/18/18 0607 12/19/18 0520 12/20/18 0500 12/21/18 0500 12/22/18 0446 12/23/18 0531  NA 150* 149* 144 144 142 138  K 3.4* 3.7 3.6 4.0 3.7 3.8  CL 118* 117* 110 110 108 104  CO2 _0 GLUCOSE 90 165* 72 140* 81 118*  BUN 33* 32* 26* 24* 24* 22*  CREATININE 0.99 1.00 0.80 0.83 0.79 0.86  CALCIUM 7.9* 8.2* 8.2* 8.3* 8.3* 8.2*  MG  --  2.5* 2.3 2.3 2.2  --   PHOS 3.6 4.6  4.6 4.7* 4.6  --   --    GFR: Estimated Creatinine Clearance: 110.9 mL/min (by C-G formula based on SCr of 0.86  mg/dL). Recent Labs  Lab 12/19/18 0520 12/20/18 0500 12/21/18 0500 12/22/18 0446  WBC 6.5 8.7 7.7 10.1    Liver Function Tests: Recent Labs  Lab 12/18/18 0607 12/19/18 0520  ALBUMIN 1.4* 1.5*   No results for input(s): LIPASE, AMYLASE in the last 168 hours. No results for input(s): AMMONIA in the last 168 hours.  ABG    Component Value Date/Time   PHART 7.282 (L) 12/07/2018 0349   PCO2ART 50.2 (H) 12/07/2018 0349   PO2ART 59.0 (L) 12/07/2018 0349   HCO3 23.4 12/07/2018 0349   TCO2 25 12/07/2018 0349   ACIDBASEDEF 3.0 (H) 12/07/2018 0349   O2SAT 84.0 12/07/2018 0349     Coagulation Profile: Recent Labs  Lab 12/23/18 0531  INR 1.26    Cardiac Enzymes: No results for input(s): CKTOTAL, CKMB, CKMBINDEX, TROPONINI in the last 168 hours.  HbA1C: Hgb A1c MFr Bld  Date/Time Value Ref Range Status  03/25/2017 05:59 AM 12.3 (H) 4.8 - 5.6 % Final    Comment:    (NOTE)         Pre-diabetes: 5.7 - 6.4  Diabetes: >6.4         Glycemic control for adults with diabetes: <7.0   12/28/2015 08:50 PM 11.7 (H) 4.8 - 5.6 % Final    Comment:    (NOTE)         Pre-diabetes: 5.7 - 6.4         Diabetes: >6.4         Glycemic control for adults with diabetes: <7.0     CBG: Recent Labs  Lab 12/23/18 1607 12/23/18 2014 12/23/18 2359 12/24/18 0341 12/24/18 0811  GLUCAP 184* 110* 138* 90 63*     Critical care time: 31 minutes    Roselie Awkward, MD [facility] Pager: [phone] Cell: [phone] If no response, call [phone]

## 2018-12-24 NOTE — Progress Notes (Signed)
Physical Therapy Wound Treatment Patient Details  Name: Antonio [name]. MRN: [id number] Date of Birth: 1956-01-29  Today's Date: 12/24/2018 Time: 3419-6222 Time Calculation (min): 34 min  Subjective  Subjective: Pt mouthing, "It made me cry," in regards to hydrotherapy. Patient and Family Stated Goals: none stated Date of Onset: (unknown) Prior Treatments: dressing changes  Pain Score:  Pt reported pain during pulsatile lavage; Pre-medicated  Wound Assessment  Pressure Injury 12/01/18 Unstageable - Full thickness tissue loss in which the base of the ulcer is covered by slough (yellow, tan, gray, green or brown) and/or eschar (tan, brown or black) in the wound bed. sacrum (Active)  Dressing Type Gauze (Comment);Moist to dry;ABD;Other (Dakins);Barrier Film (skin prep) 12/24/2018  9:58 AM  Dressing Clean;Dry;Intact;Changed 12/24/2018  9:58 AM  Dressing Change Frequency Daily 12/24/2018  9:58 AM  State of Healing Non-healing 12/24/2018  9:58 AM  Site / Wound Assessment Red;Yellow;Pink 12/24/2018  9:58 AM  % Wound base Red or Granulating 5% 12/24/2018  9:58 AM  % Wound base Yellow/Fibrinous Exudate 95% 12/24/2018  9:58 AM  % Wound base Black/Eschar 0% 12/24/2018  9:58 AM  % Wound base Other/Granulation Tissue (Comment) 0% 12/24/2018  9:58 AM  Peri-wound Assessment Purple;Excoriated;Erythema (blanchable) 12/24/2018  9:58 AM  Wound Length (cm) 8.5 cm 12/18/2018  9:18 AM  Wound Width (cm) 4 cm 12/18/2018  9:18 AM  Wound Depth (cm) 0.1 cm 12/18/2018  9:18 AM  Wound Surface Area (cm^2) 34 cm^2 12/18/2018  9:18 AM  Wound Volume (cm^3) 3.4 cm^3 12/18/2018  9:18 AM  Tunneling (cm) 2.5cm at 6:oo on clock 12/11/2018  8:00 PM  Margins Unattached edges (unapproximated) 12/24/2018  9:58 AM  Drainage Amount Minimal 12/24/2018  9:58 AM  Drainage Description Green 12/24/2018  9:58 AM  Treatment Debridement (Selective);Hydrotherapy (Pulse lavage) 12/24/2018  9:58 AM     Pressure Injury 12/22/18 Stage I -  Intact  skin with non-blanchable redness of a localized area usually over a bony prominence. Quarter size hairless wound  (Active)  Dressing Type Foam 12/22/2018  8:00 PM  Dressing Clean;Dry;Intact 12/22/2018  8:00 PM  Dressing Change Frequency PRN 12/22/2018  8:00 PM  State of Healing Closed wound edges 12/23/2018  8:00 PM  Site / Wound Assessment Other (Comment);Pink 12/22/2018  4:00 PM  Tunneling (cm) 0 12/22/2018  4:00 PM  Undermining (cm) 0 12/22/2018  4:00 PM  Margins Attached edges (approximated) 12/23/2018  8:00 PM  Drainage Amount None 12/22/2018  8:00 PM  Treatment Other (Comment) 12/22/2018  4:00 PM   Hydrotherapy Pulsed lavage therapy - wound location: sacrum Pulsed Lavage with Suction (psi): 8 psi(8-12) Pulsed Lavage with Suction - Normal Saline Used: 1000 mL Pulsed Lavage Tip: Tip with splash shield Selective Debridement Selective Debridement - Location: sacrum Selective Debridement - Tools Used: Forceps;Scalpel Selective Debridement - Tissue Removed: yellow unviable tissue   Wound Assessment and Plan  Wound Therapy - Assess/Plan/Recommendations Wound Therapy - Clinical Statement: Continues with slight blue green drainage, Dakins initiated today 1/29. Slow progress with removal of necrotic tissue. Wound Therapy - Functional Problem List: decr mobility Factors Delaying/Impairing Wound Healing: Diabetes Mellitus;Immobility;Multiple medical problems Hydrotherapy Plan: Debridement;Dressing change;Patient/family education;Pulsatile lavage with suction Wound Therapy - Frequency: 6X / week Wound Therapy - Follow Up Recommendations: Other (comment)(LTACH) Wound Plan: see above  Wound Therapy Goals- Improve the function of patient's integumentary system by progressing the wound(s) through the phases of wound healing (inflammation - proliferation - remodeling) by: Decrease Necrotic Tissue to: 70 Decrease Necrotic Tissue - Progress:  Progressing toward goal Increase Granulation Tissue to:  30 Increase Granulation Tissue - Progress: Progressing toward goal Goals/treatment plan/discharge plan were made with and agreed upon by patient/family: Yes Time For Goal Achievement: 7 days Wound Therapy - Potential for Goals: Good  Goals will be updated until maximal potential achieved or discharge criteria met.  Discharge criteria: when goals achieved, discharge from hospital, MD decision/surgical intervention, no progress towards goals, refusal/missing three consecutive treatments without notification or medical reason.  GP     [name], PT, DPT Acute Rehabilitation Services Pager [phone] Office [phone]  [name] 12/24/2018, 10:02 AM

## 2018-12-24 NOTE — Progress Notes (Signed)
Referring Physician(s): Dr Curley Spice  Supervising Physician: Oley Balm  Patient Status:  [facility] - In-pt  Chief Complaint:  Dysphagia Malnutrition  Subjective:  Percutaneous gastric tube placed in IR yesterday   Allergies: Iodinated diagnostic agents and Penicillins  Medications: Prior to Admission medications   Medication Sig Start Date End Date Taking? Authorizing Provider  aspirin EC 81 MG tablet Take 81 mg by mouth daily.   Yes [provider]  atorvastatin (LIPITOR) 40 MG tablet Take 1 tablet (40 mg total) by mouth daily at 6 PM. 03/28/17  Yes Howard Pouch, MD  glipiZIDE (GLUCOTROL) 10 MG tablet Take 10 mg by mouth 2 (two) times daily before a meal.   Yes [provider]  Insulin Degludec (TRESIBA) 100 UNIT/ML SOLN Inject 70 Units into the skin at bedtime.    Yes [provider]  insulin glargine (LANTUS) 100 UNIT/ML injection Inject 0.05 mLs (5 Units total) into the skin at bedtime. 03/28/17  Yes Howard Pouch, MD  insulin regular (NOVOLIN R,HUMULIN R) 100 units/mL injection Inject 2-4 Units into the skin as needed for high blood sugar. Only of over 150 mg   Yes [provider]  lisinopril (PRINIVIL,ZESTRIL) 5 MG tablet Take 5 mg by mouth 2 (two) times daily.   Yes [provider]  metoprolol tartrate (LOPRESSOR) 25 MG tablet Take 25 mg by mouth 2 (two) times daily.   Yes [provider]  Multiple Vitamin (MULTIVITAMIN) tablet Take 1 tablet by mouth daily.   Yes [provider]  tapentadol HCl (NUCYNTA) 75 MG tablet Take 75 mg by mouth 4 (four) times daily.    Yes [provider]     Vital Signs: BP (!) 148/75   Pulse 90   Temp 99.5 F (37.5 C) (Axillary)   Resp (!) 22   Ht 6' (1.829 m)   Wt 216 lb 0.8 oz (98 kg)   SpO2 93%   BMI 29.30 kg/m   Physical Exam Abdominal:     General: Bowel sounds are normal.     Palpations: Abdomen is soft.  Skin:    General: Skin is warm and dry.   Comments: Skin site is clean and dry No bleeding     Imaging: Ir Gastrostomy Tube Mod Sed  Result Date: 12/23/2018 INDICATION: Patient admitted influenza a pneumonia complicated by respiratory failure ultimately required intubation tracheostomy placement. Also during this admission, the patient experienced a myocardial infarction with subsequent cardiogenic shock. Given associated dysphagia, request made for percutaneous gastrostomy tube placement for enteric nutrition supplementation purposes. EXAM: PULL TROUGH GASTROSTOMY TUBE PLACEMENT COMPARISON:  CT abdomen and pelvis - 11/28/2018 MEDICATIONS: Vancomycin 1 gm IV; Antibiotics were administered within 1 hour of the procedure. Glucagon 1 mg IV CONTRAST:  15 cc Isovue-300 administered into the gastric lumen. ANESTHESIA/SEDATION: Moderate (conscious) sedation was employed during this procedure. A total of Versed 1.5 mg and Fentanyl 75 mcg was administered intravenously. Moderate Sedation Time: 10 minutes. The patient's level of consciousness and vital signs were monitored continuously by radiology nursing throughout the procedure under my direct supervision. FLUOROSCOPY TIME:  1 minute, 18 seconds (44 mGy) COMPLICATIONS: None immediate. PROCEDURE: Informed written consent was obtained from the patient following explanation of the procedure, risks, benefits and alternatives. A time out was performed prior to the initiation of the procedure. Ultrasound scanning was performed to demarcate the edge of the left lobe of the liver. Maximal barrier sterile technique utilized including caps, mask, sterile gowns, sterile gloves, large sterile  drape, hand hygiene and Betadine prep. The left upper quadrant was sterilely prepped and draped. An oral gastric catheter was inserted into the stomach under fluoroscopy. The existing nasogastric feeding tube was removed. The left costal margin and air opacified transverse colon were identified and avoided. Air was injected into  the stomach for insufflation and visualization under fluoroscopy. Under sterile conditions a 17 gauge trocar needle was utilized to access the stomach percutaneously beneath the left subcostal margin after the overlying soft tissues were anesthetized with 1% Lidocaine with epinephrine. Needle position was confirmed within the stomach with aspiration of air and injection of small amount of contrast. A single T tack was deployed for gastropexy. Over an Amplatz guide wire, a 9-French sheath was inserted into the stomach. A snare device was utilized to capture the oral gastric catheter. The snare device was pulled retrograde from the stomach up the esophagus and out the oropharynx. The 20-French pull-through gastrostomy was connected to the snare device and pulled antegrade through the oropharynx down the esophagus into the stomach and then through the percutaneous tract external to the patient. The gastrostomy was assembled externally. Contrast injection confirms position in the stomach. Several spot radiographic images were obtained in various obliquities for documentation. The patient tolerated procedure well without immediate post procedural complication. FINDINGS: After successful fluoroscopic guided placement, the gastrostomy tube is appropriately positioned with internal disc against the ventral aspect of the gastric lumen. IMPRESSION: Successful fluoroscopic insertion of a 20-French pull-through gastrostomy tube. The gastrostomy may be used immediately for medication administration and in 24 hrs for the initiation of feeds. Electronically Signed   By: Simonne ComeJohn  Watts M.D.   On: 12/23/2018 15:47   Dg Chest Port 1 View  Result Date: 12/22/2018 CLINICAL DATA:  Respiratory failure. EXAM: PORTABLE CHEST 1 VIEW COMPARISON:  12/21/2018 FINDINGS: Tracheostomy tube overlies the airway. A feeding tube courses into the upper abdomen with tip not imaged. A right PICC remains in place with tip poorly visualized. The  cardiomediastinal silhouette is unchanged. Lung volumes remain low with unchanged bibasilar opacities. No sizable pleural effusion or pneumothorax is identified. IMPRESSION: Low lung volumes with unchanged bibasilar opacities which may reflect atelectasis or infection. Electronically Signed   By: Sebastian AcheAllen  Grady M.D.   On: 12/22/2018 07:56   Dg Chest Port 1 View  Result Date: 12/21/2018 CLINICAL DATA:  Tracheostomy and shortness of breath EXAM: PORTABLE CHEST 1 VIEW COMPARISON:  Yesterday FINDINGS: Tracheostomy tube in place. A feeding tube at least reaches the stomach. Right upper extremity PICC in good position. Low volume chest with hazy opacity at the bases. No Kerley lines or pneumothorax. Normal heart size. IMPRESSION: 1. Stable hardware positioning. 2. Stable low volume chest with atelectasis or infection at the bases. Electronically Signed   By: Marnee SpringJonathon  Watts M.D.   On: 12/21/2018 08:05    Labs:  CBC: Recent Labs    12/19/18 0520 12/20/18 0500 12/21/18 0500 12/22/18 0446  WBC 6.5 8.7 7.7 10.1  HGB 8.8* 9.0* 8.8* 9.3*  HCT 30.3* 29.9* 29.1* 30.1*  PLT 354 344 345 355    COAGS: Recent Labs    11/28/18 1658 12/10/18 1242 12/23/18 0531  INR 0.97 1.17 1.26  APTT 31 37*  --     BMP: Recent Labs    12/20/18 0500 12/21/18 0500 12/22/18 0446 12/23/18 0531  NA 144 144 142 138  K 3.6 4.0 3.7 3.8  CL 110 110 108 104  CO2 24 26 27 25   GLUCOSE 72 140* 81  118*  BUN 26* 24* 24* 22*  CALCIUM 8.2* 8.3* 8.3* 8.2*  CREATININE 0.80 0.83 0.79 0.86  GFRNONAA >60 >60 >60 >60  GFRAA >60 >60 >60 >60    LIVER FUNCTION TESTS: Recent Labs    12/03/18 1508  12/06/18 0540 12/08/18 0526 12/12/18 0545 12/18/18 0607 12/19/18 0520  BILITOT 0.6  --  0.4 0.5 0.6  --   --   AST 42*  --  21 39 22  --   --   ALT 39  --  23 25 22   --   --   ALKPHOS 73  --  62 70 82  --   --   PROT 6.3*  --  5.8* 5.8* 6.2*  --   --   ALBUMIN 2.2*   < > 1.6* 1.4* 1.5* 1.4* 1.5*   < > = values in this  interval not displayed.    Assessment and Plan:  G tube in place May use now  Electronically Signed: Robet Leu, PA-C 12/24/2018, 8:44 AM   I spent a total of 15 Minutes at the the patient's bedside AND on the patient's hospital floor or unit, greater than 50% of which was counseling/coordinating care for G tube

## 2018-12-24 NOTE — Progress Notes (Signed)
Physical Therapy Treatment Patient Details Name: Antonio [name]. MRN: [id number] DOB: 1956/02/14 Today's Date: 12/24/2018    History of Present Illness 63 yo admitted with flu A and PNA with acute respiratory failure intubated 1/4, trach 1/16 with course complicated by acute MI, CHF and cardiogenic [name] and mucous plugging. PMhx: DM, HTN, HLD, CAD, Lt BKA, rt toe amputation    PT Comments    Pt able to tolerate lateral scoot to drop arm chair today. Pt was very fatigued s/p PT session which followed his hydro session. Pt remains anxious and dependent on vent at this time. Pt is attempted to move more on own however demo's decreased safety awareness and impulsivity requiring assist. Acute PT to cont to follow.    Follow Up Recommendations  LTACH;Supervision/Assistance - 24 hour     Equipment Recommendations       Recommendations for Other Services       Precautions / Restrictions Precautions Precautions: Fall Precaution Comments: trach/vent, L BKA Restrictions Weight Bearing Restrictions: No    Mobility  Bed Mobility Overal bed mobility: Needs Assistance Bed Mobility: Supine to Sit Rolling: Min assist   Supine to sit: Mod assist     General bed mobility comments: with max verbal and tactile cues pt initaited LEs off EOB and used R UE to pull up on PT to transfer to EOB, pt then able to use bilat UE to scoot and sit square on EOB  Transfers Overall transfer level: Needs assistance Equipment used: (2 person assist with bed pad) Transfers: Lateral/Scoot Transfers          Lateral/Scoot Transfers: Mod assist;+2 safety/equipment;+2 physical assistance General transfer comment: max directional verbal cues, pt attempted to try by himself however pt anxious and unable to clear bottom to scoot. modA x2 to lateral transfer to drop arm chair with use of bed pad  Ambulation/Gait                 Stairs             Wheelchair Mobility    Modified  Rankin (Stroke Patients Only)       Balance Overall balance assessment: Needs assistance Sitting-balance support: No upper extremity supported;Feet supported Sitting balance-Leahy Scale: Fair Sitting balance - Comments: min assist to maintain balance EOB with 0-2 hand support                                    Cognition Arousal/Alertness: Awake/alert Behavior During Therapy: Anxious Overall Cognitive Status: Impaired/Different from baseline Area of Impairment: Safety/judgement;Problem solving                       Following Commands: Follows one step commands consistently;Follows multi-step commands with increased time Safety/Judgement: Decreased awareness of deficits;Decreased awareness of safety   Problem Solving: Slow processing;Difficulty sequencing;Requires verbal cues;Requires tactile cues General Comments: pt on vent via trach, pt anxious regarding mobility and breathing requiring verbal cues for deep breaths      Exercises Other Exercises Other Exercises: resisted extension of R LE from hooklying position in bed x 10 reps to prep for lateral scoot transfer    General Comments General comments (skin integrity, edema, etc.): pt with noted L lateral pressure ulcer on skull. pt HR 104, RR 30 with mobility, SpO2 >94%      Pertinent Vitals/Pain Pain Assessment: No/denies pain    Home Living  Prior Function            PT Goals (current goals can now be found in the care plan section) Acute Rehab PT Goals Patient Stated Goal: didn't state Progress towards PT goals: Progressing toward goals    Frequency    Min 3X/week      PT Plan Current plan remains appropriate    Co-evaluation              AM-PAC PT "6 Clicks" Mobility   Outcome Measure  Help needed turning from your back to your side while in a flat bed without using bedrails?: A Little Help needed moving from lying on your back to sitting on  the side of a flat bed without using bedrails?: A Little Help needed moving to and from a bed to a chair (including a wheelchair)?: A Lot Help needed standing up from a chair using your arms (e.g., wheelchair or bedside chair)?: A Lot Help needed to walk in hospital room?: Total Help needed climbing 3-5 steps with a railing? : Total 6 Click Score: 12    End of Session   Activity Tolerance: Patient limited by fatigue Patient left: in chair;with call bell/phone within reach;with chair alarm set;with nursing/sitter in room Nurse Communication: Mobility status;Need for lift equipment PT Visit Diagnosis: Other abnormalities of gait and mobility (R26.89);Muscle weakness (generalized) (M62.81);Unsteadiness on feet (R26.81)     Time: 1000-1026 PT Time Calculation (min) (ACUTE ONLY): 26 min  Charges:  $Therapeutic Activity: 23-37 mins                     Antonio ShockAshly Devanny [name], PT, DPT Acute Rehabilitation Services Pager #: [phone] Office #: [phone]    Antonio [name] 12/24/2018, 2:05 PM

## 2018-12-24 NOTE — Progress Notes (Signed)
  Speech Language Pathology  Patient Details Name: Antonio RichardsDaniel Watson Atayde Jr. MRN: [id number] DOB: 08/23/1956 Today's Date: 12/24/2018 Time:  -     Discussed status with RN who was going to see about attempting trach collar today. Checked in with RN one hour later and pt showing increased work of breathing on ventilator (had sat in chair prior). Will continue efforts.                    GO                Royce MacadamiaLitaker, Joeseph Verville Willis 12/24/2018, 3:21 PM   Breck CoonsLisa Willis Lonell FaceLitaker M.Ed Nurse, children'sCCC-SLP Speech-Language Pathologist Pager [phone] Office [phone]

## 2018-12-24 NOTE — Progress Notes (Signed)
  Echocardiogram 2D Echocardiogram with definity has been performed.  Leta Jungling M 12/24/2018, 1:21 PM

## 2018-12-25 DIAGNOSIS — I420 Dilated cardiomyopathy: Secondary | ICD-10-CM

## 2018-12-25 LAB — GLUCOSE, CAPILLARY
GLUCOSE-CAPILLARY: 116 mg/dL — AB (ref 70–99)
Glucose-Capillary: 120 mg/dL — ABNORMAL HIGH (ref 70–99)
Glucose-Capillary: 161 mg/dL — ABNORMAL HIGH (ref 70–99)
Glucose-Capillary: 173 mg/dL — ABNORMAL HIGH (ref 70–99)
Glucose-Capillary: 183 mg/dL — ABNORMAL HIGH (ref 70–99)
Glucose-Capillary: 226 mg/dL — ABNORMAL HIGH (ref 70–99)
Glucose-Capillary: 63 mg/dL — ABNORMAL LOW (ref 70–99)
Glucose-Capillary: 63 mg/dL — ABNORMAL LOW (ref 70–99)
Glucose-Capillary: 75 mg/dL (ref 70–99)

## 2018-12-25 MED ORDER — DEXTROSE 50 % IV SOLN: 25.0000 mL | Freq: Once | INTRAVENOUS | Status: AC

## 2018-12-25 MED ORDER — CARVEDILOL 3.125 MG PO TABS
6.2500 mg | ORAL_TABLET | Freq: Two times a day (BID) | ORAL | Status: DC
  Administered 2018-12-25 – 2019-01-05 (×22): 6.25 mg via ORAL
  Filled 2018-12-25 (×23): qty 2

## 2018-12-25 MED ORDER — DEXTROSE 50 % IV SOLN
INTRAVENOUS | Status: AC
  Administered 2018-12-25: 25 mL
  Filled 2018-12-25: qty 50

## 2018-12-25 MED FILL — Fentanyl TD Patch 72HR 75 MCG/HR: TRANSDERMAL | Qty: 1 | Status: AC

## 2018-12-25 NOTE — Progress Notes (Signed)
Hypoglycemic Event  CBG: 63  Treatment: 12.5 gm  D50  Symptoms: None   Follow-up CBG: Time: 2028 CBG Result:120  Possible Reasons for Event: Tube feeding stopped related to vomiting and possibly aspiration  Comments/MD notified:Notified 12/25/2018 2000.  Requested that we alert her of further blood sugars and may require gtt if persistent low blood sugars.      Kelton Pillar RN BSN

## 2018-12-25 NOTE — Progress Notes (Signed)
Progress Note  Patient Name: Antonio [name]. Date of Encounter: 12/25/2018  Primary Cardiologist: Minus Breeding, MD   Subjective   Trach; no chest pain or dyspnea  Inpatient Medications    Scheduled Meds: . aspirin  81 mg Per Tube Daily  . atorvastatin  40 mg Oral q1800  . bethanechol  10 mg Per Tube QID  . carvedilol  3.125 mg Oral BID WC  . chlorhexidine gluconate (MEDLINE KIT)  15 mL Mouth Rinse BID  . Chlorhexidine Gluconate Cloth  6 each Topical Daily  . clonazePAM  0.5 mg Per Tube BID  . enoxaparin (LOVENOX) injection  40 mg Subcutaneous Daily  . famotidine  20 mg Per Tube BID  . feeding supplement (PRO-STAT SUGAR FREE 64)  60 mL Per Tube BID  . fentaNYL  75 mcg Transdermal Q72H  . folic acid  1 mg Per Tube Daily  . free water  300 mL Per Tube Q6H  . guaiFENesin  5 mL Per Tube Q12H  . insulin aspart  0-20 Units Subcutaneous Q4H  . insulin detemir  45 Units Subcutaneous BID  . ipratropium-albuterol  3 mL Nebulization TID  . losartan  25 mg Oral Daily  . mouth rinse  15 mL Mouth Rinse 10 times per day  . multivitamin  15 mL Oral Daily  . nutrition supplement (JUVEN)  1 packet Per Tube BID BM  . QUEtiapine  200 mg Per Tube BID  . sodium chloride flush  10-40 mL Intracatheter Q12H  . sodium hypochlorite   Irrigation BID  . thiamine  100 mg Per Tube Daily   Continuous Infusions: . sodium chloride 10 mL/hr at 12/25/18 1200  . feeding supplement (VITAL 1.5 CAL) Stopped (12/25/18 1210)   PRN Meds: sodium chloride, acetaminophen (TYLENOL) oral liquid 160 mg/5 mL, albuterol, bisacodyl, docusate, fentaNYL (SUBLIMAZE) injection, ibuprofen, LORazepam, polyethylene glycol, sodium chloride flush   Vital Signs    Vitals:   12/25/18 0900 12/25/18 1000 12/25/18 1100 12/25/18 1140  BP: (!) 141/75 (!) 157/80 (!) 144/74 (!) 144/74  Pulse: 92 99  (!) 106  Resp: 18 (!) 29 (!) 24 (!) 26  Temp:      TempSrc:      SpO2: 98% 95% 95% 95%  Weight:      Height:         Intake/Output Summary (Last 24 hours) at 12/25/2018 1223 Last data filed at 12/25/2018 1200 Gross per 24 hour  Intake 1096.38 ml  Output 1490 ml  Net -393.62 ml   Last 3 Weights 12/25/2018 12/24/2018 12/19/2018  Weight (lbs) 210 lb 1.6 oz 216 lb 0.8 oz 212 lb 11.9 oz  Weight (kg) 95.3 kg 98 kg 96.5 kg      Telemetry    Sinus with rare PVC- Personally Reviewed   Physical Exam   GEN: WD NAD Neck: trach; supple Cardiac: RRR Respiratory: CTA GI: Soft, NT/ND MS: No edema; prior BKA Neuro:  Grossly intact  Labs    Chemistry Recent Labs  Lab 12/19/18 0520  12/21/18 0500 12/22/18 0446 12/23/18 0531  NA 149*   < > 144 142 138  K 3.7   < > 4.0 3.7 3.8  CL 117*   < > 110 108 104  CO2 25   < > _0 GLUCOSE 165*   < > 140* 81 118*  BUN 32*   < > 24* 24* 22*  CREATININE 1.00   < > 0.83 0.79 0.86  CALCIUM 8.2*   < >  8.3* 8.3* 8.2*  ALBUMIN 1.5*  --   --   --   --   GFRNONAA >60   < > >60 >60 >60  GFRAA >60   < > >60 >60 >60  ANIONGAP 7   < > _0 < > = values in this interval not displayed.     Hematology Recent Labs  Lab 12/20/18 0500 12/21/18 0500 12/22/18 0446  WBC 8.7 7.7 10.1  RBC 3.16* 3.09* 3.29*  HGB 9.0* 8.8* 9.3*  HCT 29.9* 29.1* 30.1*  MCV 94.6 94.2 91.5  MCH 28.5 28.5 28.3  MCHC 30.1 30.2 30.9  RDW 14.1 13.9 13.9  PLT 344 345 355       Radiology    Ir Gastrostomy Tube Mod Sed  Result Date: 12/23/2018 INDICATION: Patient admitted influenza a pneumonia complicated by respiratory failure ultimately required intubation tracheostomy placement. Also during this admission, the patient experienced a myocardial infarction with subsequent cardiogenic shock. Given associated dysphagia, request made for percutaneous gastrostomy tube placement for enteric nutrition supplementation purposes. EXAM: PULL TROUGH GASTROSTOMY TUBE PLACEMENT COMPARISON:  CT abdomen and pelvis - 11/28/2018 MEDICATIONS: Vancomycin 1 gm IV; Antibiotics were administered within  1 hour of the procedure. Glucagon 1 mg IV CONTRAST:  15 cc Isovue-300 administered into the gastric lumen. ANESTHESIA/SEDATION: Moderate (conscious) sedation was employed during this procedure. A total of Versed 1.5 mg and Fentanyl 75 mcg was administered intravenously. Moderate Sedation Time: 10 minutes. The patient's level of consciousness and vital signs were monitored continuously by radiology nursing throughout the procedure under my direct supervision. FLUOROSCOPY TIME:  1 minute, 18 seconds (44 mGy) COMPLICATIONS: None immediate. PROCEDURE: Informed written consent was obtained from the patient following explanation of the procedure, risks, benefits and alternatives. A time out was performed prior to the initiation of the procedure. Ultrasound scanning was performed to demarcate the edge of the left lobe of the liver. Maximal barrier sterile technique utilized including caps, mask, sterile gowns, sterile gloves, large sterile drape, hand hygiene and Betadine prep. The left upper quadrant was sterilely prepped and draped. An oral gastric catheter was inserted into the stomach under fluoroscopy. The existing nasogastric feeding tube was removed. The left costal margin and air opacified transverse colon were identified and avoided. Air was injected into the stomach for insufflation and visualization under fluoroscopy. Under sterile conditions a 17 gauge trocar needle was utilized to access the stomach percutaneously beneath the left subcostal margin after the overlying soft tissues were anesthetized with 1% Lidocaine with epinephrine. Needle position was confirmed within the stomach with aspiration of air and injection of small amount of contrast. A single T tack was deployed for gastropexy. Over an Amplatz guide wire, a 9-French sheath was inserted into the stomach. A snare device was utilized to capture the oral gastric catheter. The snare device was pulled retrograde from the stomach up the esophagus and out  the oropharynx. The 20-French pull-through gastrostomy was connected to the snare device and pulled antegrade through the oropharynx down the esophagus into the stomach and then through the percutaneous tract external to the patient. The gastrostomy was assembled externally. Contrast injection confirms position in the stomach. Several spot radiographic images were obtained in various obliquities for documentation. The patient tolerated procedure well without immediate post procedural complication. FINDINGS: After successful fluoroscopic guided placement, the gastrostomy tube is appropriately positioned with internal disc against the ventral aspect of the gastric lumen. IMPRESSION: Successful fluoroscopic insertion of a 20-French pull-through gastrostomy tube. The  gastrostomy may be used immediately for medication administration and in 24 hrs for the initiation of feeds. Electronically Signed   By: Sandi Mariscal M.D.   On: 12/23/2018 15:47    Patient Profile     63 y.o.malewith PMH of CAD, ICM with previous baseline of 40%, HTN, HLD and DM II who presented01/03with acute respiratory failure due to influenza. EF was 20-25% on echo. He was intubated and treated for cardiogenic shock and respiratory failure>>trach 01/16. Pending left and right heartcathoncemore medically stable.  Assessment & Plan    1 ischemic cardiomyopathy-follow-up echocardiogram shows LV function has now normalized.  Initial cardiomyopathy possibly secondary to stress of influenza/infection.  Continue ARB.  Increase carvedilol to 6.25 mg twice daily.  Advance as tolerated by blood pressure.  Patient can follow-up with Dr. Percival Spanish after discharge.  Can consider further ischemia evaluation at that time.  2 hypertension-blood pressure elevated.  Increase carvedilol to 6.25 mg twice daily.  Follow blood pressure and advance either carvedilol or losartan as needed.  3 acute respiratory failure-occurred in the setting of influenza  complicated by Corynebacterium pneumonia.  He is status post tracheostomy.  Ventilator management per critical care medicine.  [facility] will sign off.   Medication Recommendations: Continue carvedilol and losartan at discharge. Other recommendations (labs, testing, etc): No further cardiac testing at this time. Follow up as an outpatient: Follow-up Dr. Percival Spanish 4 to 6 weeks after discharge.  Can consider further ischemia evaluation at that time if needed.  For questions or updates, please contact [place] Please consult [email or web address] for contact info under        Signed, Kirk Ruths, MD  12/25/2018, 12:23 PM

## 2018-12-25 NOTE — Progress Notes (Signed)
NAME:  Antonio [name]., MRN:  [id number], DOB:  1956-08-14, LOS: 15 ADMISSION DATE:  11/28/2018, CONSULTATION DATE:  1/3 REFERRING MD:  EDP, CHIEF COMPLAINT:  Dyspnea   Brief History   63 year old male with influenza A causing respiratory failure and cardiogenic shock.  Prolonged mechanical ventilation, tracheostomy placed on January 16.  Past Medical History  DM2, CAD, HTN  Significant Hospital Events   1/6 self extubated 1/7 re-intubated 1/8 self extubated 1/9 acute respiratory failure, mucus plugging right lower lobe, re-intubated, bronchoscopy 1/10 New fever T Max 103.1, antibiotics resumed with ceftaz and vancomycin 1/11: Hemodynamically seems to be improving. Very hypernatremic. X-ray improving. Adding free water, one-time Lasix, follow-up chemistry a.m. Hoping to initiate weaning efforts on 1/12 1/16. Continuing diuresis and ongoing ventilatory support. Working on weaning but still has significant work of breathing. Plan has been to proceed with tracheostomy. Hopefully we can continue ongoing diuresis and continue to decrease sedation 1/20 still struggling to wean. Secretions and anxiety are barriers.  1/21 trach revision to 6 distal XLT, fever, increased secretions, abx broadened / bronch 1/22 ongoing thick secretions 1/24 Failed SBT, low grade fever, Per CXR persistent pleural effusions / basilar consolidation atelectasis vs pneumonia per bases 1/25>> Mucus plugged with slow recovery after chest PT and Wound Care  Consults:  PCCM Cardiology   Procedures:  ETT 1/3 >>1/6, 1/7 > 1/8> 1/9>> 1/16 1/5 Right TL PICC >> 1/9 Bronch>> Mucus plugging RLL Trach 1/16 (JY) >>1/20 trach change failed and #4 cuffed 1/21 trach upsized to 6 cuffed XLT distal  1/17 right nare cortrak >> 1/29 1/28 gastrostomy tube >   Significant Diagnostic Tests:  UDS 1/4 >>negative   ECHO 1/4 >>LVEF 20-25%, akinesis of the anteroseptal, anterolateral & apical myocardium, LVEF  30-07%, grade 1 diastolic dysfunction, small pericardial effusion without evidence of hemodynamic compromise  CT Chest / ABD / Pelvis 1/3 >> airway thickening, motion artifact, CAD  Echo 1/29>> LVEF 60-65%, left ventricular hypertrophy, trivial pericardial effusion  Micro Data:  RVP 1/3 >> positive for influenza A BCx2 1/3 >>No growth UC 1/4 >>No Growth Sputum Cx 1/7 >> Normal respiratory flora BAL >>corynebacterium  Blood Cx 1/10 > neg Sputum 1/21 > abundant corynebacterium striatum  Antimicrobials:  Vanco 1/3 x1;    Zosyn 1/30 x1  Tamiflu 1/3 >1/8 1/10 vancomycin>1/12 1/10 ceftaz >1/12;   1/21 >>1/22 1/12 emycin > 1/22 1/23 cefepime >> 1/24 1/21 Vanc> 1/28  Interim history/subjective:  Restless today Continues on pressure support weans.  No acute events overnight  Objective   Blood pressure (!) 157/80, pulse 99, temperature 98.8 F (37.1 C), temperature source Oral, resp. rate (!) 29, height 6' (1.829 m), weight 95.3 kg, SpO2 95 %. CVP:  [8 mmHg-11 mmHg] 11 mmHg  Vent Mode: PSV;CPAP FiO2 (%):  [40 %] 40 % Set Rate:  [22 bmp] 22 bmp Vt Set:  [620 mL] 620 mL PEEP:  [5 cmH20] 5 cmH20 Pressure Support:  [14 cmH20] 14 cmH20 Plateau Pressure:  [18 cmH20-20 cmH20] 19 cmH20   Intake/Output Summary (Last 24 hours) at 12/25/2018 1039 Last data filed at 12/25/2018 1000 Gross per 24 hour  Intake 966.38 ml  Output 1490 ml  Net -523.62 ml   Filed Weights   12/19/18 0500 12/24/18 0500 12/25/18 0420  Weight: 96.5 kg 98 kg 95.3 kg    Examination: Gen:      No acute distress HEENT:  EOMI, sclera anicteric, trach in place Neck:     No masses; no thyromegaly  Lungs:    Clear to auscultation bilaterally; normal respiratory effort CV:         Regular rate and rhythm; no murmurs Abd:      + bowel sounds; soft, non-tender; no palpable masses, no distension Ext:    Right BKA Skin:      Warm and dry; no rash Neuro: Awake, responsive.   Resolved Hospital Problem list    AKI Pneumonia Hypokalemia Hypernatremia  Assessment & Plan:  Acute respiratory failure with hypoxemia Prolonged respiratory failure after flu a infection, Corynebacterium pneumonia. Continue trach care Pressure support weans Attempt trach collar as tolerated Continue physical therapy, pulmonary toilet.  Acute systolic heart failure Follow-up echo shows recovery of EF to normal range Continue Coreg, cozaar  Nutrition S/p PEG by IR Tube feeds.  Acute metabolic encephalopathy : dramatically improved, precedex off Continue low-dose clonidine Wean off per protocol Continue Seroquel  DM2 Continue SSI, Levemir   Best practice:  Diet: tube feeding Pain/Anxiety/Delirium protocol (if indicated): as above, RASS goal 0 VAP protocol (if indicated): yes DVT prophylaxis: lovenox GI prophylaxis: famotidine Glucose control: SSI/Detemir Mobility: PT working with him Code Status: Full Family Communication: Mother updated 1/30 Disposition: Doesn't have LTAC benefits, weaning well this week so hoping to avoid vent-SNF  Labs   CBC: Recent Labs  Lab 12/19/18 0520 12/20/18 0500 12/21/18 0500 12/22/18 0446  WBC 6.5 8.7 7.7 10.1  HGB 8.8* 9.0* 8.8* 9.3*  HCT 30.3* 29.9* 29.1* 30.1*  MCV 96.2 94.6 94.2 91.5  PLT 354 344 345 756    Basic Metabolic Panel: Recent Labs  Lab 12/19/18 0520 12/20/18 0500 12/21/18 0500 12/22/18 0446 12/23/18 0531  NA 149* 144 144 142 138  K 3.7 3.6 4.0 3.7 3.8  CL 117* 110 110 108 104  CO2 _0 GLUCOSE 165* 72 140* 81 118*  BUN 32* 26* 24* 24* 22*  CREATININE 1.00 0.80 0.83 0.79 0.86  CALCIUM 8.2* 8.2* 8.3* 8.3* 8.2*  MG 2.5* 2.3 2.3 2.2  --   PHOS 4.6  4.6 4.7* 4.6  --   --    GFR: Estimated Creatinine Clearance: 109.4 mL/min (by C-G formula based on SCr of 0.86 mg/dL). Recent Labs  Lab 12/19/18 0520 12/20/18 0500 12/21/18 0500 12/22/18 0446  WBC 6.5 8.7 7.7 10.1    Liver Function Tests: Recent Labs  Lab  12/19/18 0520  ALBUMIN 1.5*   No results for input(s): LIPASE, AMYLASE in the last 168 hours. No results for input(s): AMMONIA in the last 168 hours.  ABG    Component Value Date/Time   PHART 7.282 (L) 12/07/2018 0349   PCO2ART 50.2 (H) 12/07/2018 0349   PO2ART 59.0 (L) 12/07/2018 0349   HCO3 23.4 12/07/2018 0349   TCO2 25 12/07/2018 0349   ACIDBASEDEF 3.0 (H) 12/07/2018 0349   O2SAT 84.0 12/07/2018 0349     Coagulation Profile: Recent Labs  Lab 12/23/18 0531  INR 1.26    Cardiac Enzymes: No results for input(s): CKTOTAL, CKMB, CKMBINDEX, TROPONINI in the last 168 hours.  HbA1C: Hgb A1c MFr Bld  Date/Time Value Ref Range Status  03/25/2017 05:59 AM 12.3 (H) 4.8 - 5.6 % Final    Comment:    (NOTE)         Pre-diabetes: 5.7 - 6.4         Diabetes: >6.4         Glycemic control for adults with diabetes: <7.0   12/28/2015 08:50 PM 11.7 (H) 4.8 - 5.6 %  Final    Comment:    (NOTE)         Pre-diabetes: 5.7 - 6.4         Diabetes: >6.4         Glycemic control for adults with diabetes: <7.0     CBG: Recent Labs  Lab 12/24/18 1624 12/24/18 2017 12/25/18 0014 12/25/18 0402 12/25/18 0836  GLUCAP 208* 212* 226* 183* 161*    The patient is critically ill with multiple organ system failure and requires high complexity decision making for assessment and support, frequent evaluation and titration of therapies, advanced monitoring, review of radiographic studies and interpretation of complex data.   Critical Care Time devoted to patient care services, exclusive of separately billable procedures, described in this note is 35 minutes.   Marshell Garfinkel MD [facility] Pager [phone] If no answer call [phone] 12/25/2018, 10:39 AM

## 2018-12-25 NOTE — Progress Notes (Signed)
Occupational Therapy Treatment Patient Details Name: Antonio [name]. MRN: [id number] DOB: 12-29-1955 Today's Date: 12/25/2018    History of present illness 63 yo admitted with flu A and PNA with acute respiratory failure intubated 1/4, trach 1/16 with course complicated by acute MI, CHF and cardiogenic shock and mucous plugging. PMhx: DM, HTN, HLD, CAD, Lt BKA, rt toe amputation   OT comments  This 63 yo male admitted with above presents today with restlessness, confusion over his prothesis, and not agreeable to grooming task. Pt did however stand x1 with prothesis on today at EOB. He will continue to benefit from acute OT with follow up on LTACH.   Follow Up Recommendations  LTACH    Equipment Recommendations  Other (comment)(TBD next venue)    Recommendations for Other Services Rehab consult    Precautions / Restrictions Precautions Precautions: Fall Precaution Comments: trach/vent, L BKA Restrictions Weight Bearing Restrictions: No       Mobility Bed Mobility Overal bed mobility: Needs Assistance Bed Mobility: Supine to Sit;Sit to Supine     Supine to sit: Mod assist Sit to supine: Mod assist;+2 for physical assistance      Transfers Overall transfer level: Needs assistance Equipment used: 2 person hand held assist Transfers: Sit to/from Stand Sit to Stand: Max assist;+2 physical assistance         General transfer comment: heavy lean to right    Balance Overall balance assessment: Needs assistance Sitting-balance support: No upper extremity supported;Feet supported Sitting balance-Leahy Scale: Fair     Standing balance support: Bilateral upper extremity supported Standing balance-Leahy Scale: Poor Standing balance comment: B UE support with heavy R lateral lean                           ADL either performed or assessed with clinical judgement   ADL Overall ADL's : Needs assistance/impaired                                       General ADL Comments: Tried to get pt to wash his face and he refused x2.      Vision Patient Visual Report: No change from baseline            Cognition Arousal/Alertness: Awake/alert Behavior During Therapy: Impulsive;Restless Overall Cognitive Status: Impaired/Different from baseline Area of Impairment: Safety/judgement;Problem solving                         Safety/Judgement: Decreased awareness of deficits;Decreased awareness of safety   Problem Solving: Slow processing;Difficulty sequencing;Requires verbal cues;Requires tactile cues General Comments: Pt had prothesis in room. I donned his neoprene sleeve for him while supine in bed, once he sat up on EOB he wanted his prothesis-I handed it to him and he was convinced it was the "wrong leg" until he was shown his right shoe--then he let us put on his prothesis.                   Pertinent Vitals/ Pain       Pain Assessment: Faces Faces Pain Scale: No hurt Pain Intervention(s): Monitored during session     Prior Functioning/Environment              Frequency  Min 2X/week        Progress Toward Goals  OT Goals(current goals can now  be found in the care plan section)  Progress towards OT goals: Not progressing toward goals - comment     Plan Discharge plan remains appropriate;Frequency remains appropriate       AM-PAC OT "6 Clicks" Daily Activity     Outcome Measure   Help from another person eating meals?: Total Help from another person taking care of personal grooming?: A Lot Help from another person toileting, which includes using toliet, bedpan, or urinal?: Total Help from another person bathing (including washing, rinsing, drying)?: Total Help from another person to put on and taking off regular upper body clothing?: Total Help from another person to put on and taking off regular lower body clothing?: Total 6 Click Score: 7    End of Session Equipment Utilized During  Treatment: Other (comment)(vent)  OT Visit Diagnosis: Unsteadiness on feet (R26.81);Other abnormalities of gait and mobility (R26.89);Muscle weakness (generalized) (M62.81);Other symptoms and signs involving cognitive function   Activity Tolerance Patient tolerated treatment well   Patient Left in bed;with call bell/phone within reach;with bed alarm set           Time: [id number] OT Time Calculation (min): 31 min  Charges: OT General Charges $OT Visit: 1 Visit OT Treatments $Self Care/Home Management : 23-37 mins  Ignacia Palmaathy Daelen [place], OTR/L [facility] Pager [phone] Office [phone]      Evette GeorgesLeonard, Hadrian Yarbrough Eva 12/25/2018, 2:02 PM

## 2018-12-25 NOTE — Progress Notes (Signed)
Hypoglycemic Event  CBG: 63  Treatment: D50 25 mL (12.5 gm)  Symptoms: None  Follow-up CBG: Time:1724 CBG Result:75  Possible Reasons for Event: Inadequate meal intake, TF stopped this afternoon   Comments/MD notified:d5 24mL given     Antonio [name], [place] Scrape

## 2018-12-25 NOTE — Progress Notes (Signed)
Patient working with speech and respiratory therapy when he vomited tube feeding. They suctioned him and had him sitting up high in the bed. RN turned TF completely off and updated MD. Pt does not seem to be in any distress at this point. He is back on full vent support. Will attempt trach collar tomorrow. Pt's family at the bedside. Damaree Sargent, [place] Scrape, RN

## 2018-12-25 NOTE — Progress Notes (Signed)
Physical Therapy Wound Treatment Patient Details  Name: Antonio [name]. MRN: [id number] Date of Birth: 1955-12-03  Today's Date: 12/25/2018 Time: 0865-7846 Time Calculation (min): 37 min  Subjective  Subjective: Pt mouthing, "can I have a washcloth?" Patient and Family Stated Goals: none stated Date of Onset: (unknown) Prior Treatments: dressing changes  Pain Score:  Pt reporting buttocks pain at rest and with hydrotherapy; Pre medicated   Wound Assessment  Pressure Injury 12/01/18 Unstageable - Full thickness tissue loss in which the base of the ulcer is covered by slough (yellow, tan, gray, green or brown) and/or eschar (tan, brown or black) in the wound bed. sacrum (Active)  Wound Image   12/25/2018 10:07 AM  Dressing Type Gauze (Comment);Moist to dry;ABD;Other (Comment);Barrier Film (skin prep) 12/25/2018 10:07 AM  Dressing Clean;Dry;Intact;Changed 12/25/2018 10:07 AM  Dressing Change Frequency Daily 12/25/2018 10:07 AM  State of Healing Non-healing 12/25/2018 10:07 AM  Site / Wound Assessment Red;Yellow;Pink 12/25/2018 10:07 AM  % Wound base Red or Granulating 5% 12/25/2018 10:07 AM  % Wound base Yellow/Fibrinous Exudate 95% 12/25/2018 10:07 AM  % Wound base Black/Eschar 0% 12/25/2018 10:07 AM  % Wound base Other/Granulation Tissue (Comment) 0% 12/25/2018 10:07 AM  Peri-wound Assessment Purple;Excoriated;Erythema (blanchable) 12/25/2018 10:07 AM  Wound Length (cm) 8 cm 12/25/2018 10:07 AM  Wound Width (cm) 5 cm 12/25/2018 10:07 AM  Wound Depth (cm) 0.1 cm 12/25/2018 10:07 AM  Wound Surface Area (cm^2) 40 cm^2 12/25/2018 10:07 AM  Wound Volume (cm^3) 4 cm^3 12/25/2018 10:07 AM  Tunneling (cm) 2.5cm at 6:oo on clock 12/11/2018  8:00 PM  Margins Unattached edges (unapproximated) 12/25/2018 10:07 AM  Drainage Amount Minimal 12/25/2018 10:07 AM  Drainage Description Serosanguineous 12/25/2018 10:07 AM  Treatment Hydrotherapy (Pulse lavage) 12/25/2018 10:07 AM      Hydrotherapy Pulsed lavage  therapy - wound location: sacrum Pulsed Lavage with Suction (psi): 8 psi(8-12) Pulsed Lavage with Suction - Normal Saline Used: 1000 mL Pulsed Lavage Tip: Tip with splash shield   Wound Assessment and Plan  Wound Therapy - Assess/Plan/Recommendations Wound Therapy - Clinical Statement: No blue/green drainage noted this session. Pt became agitated during pulsatile lavage and tried to roll off of the bed. Therefore, did not attempt debridement and provided completion of pulsatile lavage and dressing change. Slow progress with removal of necrotic tissue. Wound Therapy - Functional Problem List: decr mobility Factors Delaying/Impairing Wound Healing: Diabetes Mellitus;Immobility;Multiple medical problems Hydrotherapy Plan: Debridement;Dressing change;Patient/family education;Pulsatile lavage with suction Wound Therapy - Frequency: 6X / week Wound Therapy - Follow Up Recommendations: Other (comment)(LTACH) Wound Plan: see above  Wound Therapy Goals- Improve the function of patient's integumentary system by progressing the wound(s) through the phases of wound healing (inflammation - proliferation - remodeling) by: Decrease Necrotic Tissue to: 80 Decrease Necrotic Tissue - Progress: Goal set today Increase Granulation Tissue to: 20 Increase Granulation Tissue - Progress: Goal set today Goals/treatment plan/discharge plan were made with and agreed upon by patient/family: Yes Time For Goal Achievement: 7 days Wound Therapy - Potential for Goals: Fair  Goals will be updated until maximal potential achieved or discharge criteria met.  Discharge criteria: when goals achieved, discharge from hospital, MD decision/surgical intervention, no progress towards goals, refusal/missing three consecutive treatments without notification or medical reason.  GP    [name], PT, DPT Acute Rehabilitation Services Pager [phone] Office [phone]  [name] 12/25/2018, 10:19 AM

## 2018-12-25 NOTE — Evaluation (Signed)
[facility]y-[facility] Speaking Valve - Evaluation Patient Details  Name: Antonio [name]. MRN: [id number] Date of Birth: 05-Jan-1956  Today's Date: 12/25/2018 Time: 1121-1137 SLP Time Calculation (min) (ACUTE ONLY): 16 min  Past Medical History:  Past Medical History:  Diagnosis Date  . Coronary artery disease   . Diabetes mellitus without complication (HCC)   . Hyperlipidemia   . Hypertension   . Osteomyelitis (HCC) 03/25/2017   RT FOOT   Past Surgical History:  Past Surgical History:  Procedure Laterality Date  . AMPUTATION Right 03/27/2017   Procedure: 1st Ray Amputation Right Foot;  Surgeon: Nadara Mustard, MD;  Location: [facility] OR;  Service: Orthopedics;  Laterality: Right;  . APPENDECTOMY    . CARDIAC CATHETERIZATION    . CORONARY STENT INTERVENTION  2005  . Great toe amputation right.    . I&D EXTREMITY Left 12/30/2015   Procedure: IRRIGATION AND DEBRIDEMENT LEFT FOOT, TRANSMETATARSAL AMPUTATION WITH APPLICATION OF ANTIBIOTIC BEADS AND WOUND VAC;  Surgeon: Nadara Mustard, MD;  Location: [facility] OR;  Service: Orthopedics;  Laterality: Left;  . IR GASTROSTOMY TUBE MOD SED  12/23/2018  . TONSILLECTOMY     HPI:  63 year old male coming in with fever cough chest pain and shortness of breath was found to have influenza A. Intubated 1/3-1/8. PMH: diabetes mellitus, hypertension. CXR 1/7 Low volume chest with interstitial coarsening and streaky basilar density. No effusion or pneumothorax.   Assessment / Plan / Recommendation Clinical Impression  Pt seen for inline vent PMV with RT who switched from PS mode to full support to allow access of upper airway and successful use of valve. Pt was alert, mouthing words at a rapid rate; difficul to decode. RT deep suctioned, deflated cuff and pt brought mucous to oral cavity for suctioning. Expected cough present during and after deep suctioning however he continued to cough/throat clear, pharyngeal congestion present and expectorated what ST and RT agreed had  similar appearance to tube feedings. Pt repositioned and tube feeding placed on hold. He was unable to clear secretions therefore PMV was not placed. Will continue for attempt with PMV.      SLP Visit Diagnosis: Aphonia (R49.1)    SLP Assessment  Patient needs continued Speech Lanaguage Pathology Services    Follow Up Recommendations  (TBD)    Frequency and Duration min 2x/week  2 weeks    PMSV Trial PMSV was placed for: (not able to place)   Tracheostomy Tube       Vent Dependency  Vent Mode: PRVC Set Rate: 22 bmp PEEP: 5 cmH20 FiO2 (%): 40 % Vt Set: 620 mL    Cuff Deflation Trial  GO Tolerated Cuff Deflation: No(? apiration of tube feeds?) Length of Time for Cuff Deflation Trial: 2 min Behavior: Alert;Responsive to questions;Cooperative        Royce Macadamia 12/25/2018, 1:35 PM  Breck Coons Lonell Face.Ed Nurse, children's [phone] Office [phone]

## 2018-12-25 NOTE — Progress Notes (Signed)
Pt. Consistently moving out of the bed and claims "he is leaving and will walk out of here". He constantly slides to the end and edge of the bed throughout yesterday and today, requested a posey belt restraint from Elink. Awaiting further orders. Will continue to monitor  Sherrie George, RN BSN

## 2018-12-26 ENCOUNTER — Inpatient Hospital Stay (HOSPITAL_COMMUNITY): Payer: Medicaid Other

## 2018-12-26 LAB — GLUCOSE, CAPILLARY
Glucose-Capillary: 98 mg/dL (ref 70–99)
Glucose-Capillary: 134 mg/dL — ABNORMAL HIGH (ref 70–99)
Glucose-Capillary: 139 mg/dL — ABNORMAL HIGH (ref 70–99)
Glucose-Capillary: 127 mg/dL — ABNORMAL HIGH (ref 70–99)
Glucose-Capillary: 80 mg/dL (ref 70–99)
Glucose-Capillary: 71 mg/dL (ref 70–99)

## 2018-12-26 LAB — BASIC METABOLIC PANEL
ANION GAP: 9 (ref 5–15)
BUN: 29 mg/dL — ABNORMAL HIGH (ref 6–20)
CO2: 25 mmol/L (ref 22–32)
Calcium: 8.7 mg/dL — ABNORMAL LOW (ref 8.9–10.3)
Chloride: 105 mmol/L (ref 98–111)
Creatinine, Ser: 0.78 mg/dL (ref 0.61–1.24)
GFR calc non Af Amer: >60 mL/min (ref >60)
Glucose, Bld: 116 mg/dL — ABNORMAL HIGH (ref 70–99)
Potassium: 3.8 mmol/L (ref 3.5–5.1)
Sodium: 139 mmol/L (ref 135–145)

## 2018-12-26 LAB — PHOSPHORUS: Phosphorus: 4.5 mg/dL (ref 2.5–4.6)

## 2018-12-26 LAB — CBC
HCT: 29.6 % — ABNORMAL LOW (ref 39.0–52.0)
Hemoglobin: 9.2 g/dL — ABNORMAL LOW (ref 13.0–17.0)
MCH: 26.9 pg (ref 26.0–34.0)
MCHC: 31.1 g/dL (ref 30.0–36.0)
MCV: 86.5 fL (ref 80.0–100.0)
NRBC: 0 % (ref 0.0–0.2)
Platelets: 297 10*3/uL (ref 150–400)
RBC: 3.42 MIL/uL — ABNORMAL LOW (ref 4.22–5.81)
RDW: 13.8 % (ref 11.5–15.5)
WBC: 7 10*3/uL (ref 4.0–10.5)

## 2018-12-26 LAB — MAGNESIUM: Magnesium: 1.8 mg/dL (ref 1.7–2.4)

## 2018-12-26 NOTE — Progress Notes (Signed)
RT NOTE:  Pt was sustaining RR above 35 and looked pale in tone on weaning trial. Switched pt back to his previous settings for rest. Pt's vitals are stable at this time. Rt will continue to monitor.

## 2018-12-26 NOTE — Progress Notes (Addendum)
NAME:  Antonio [name]., MRN:  [id number], DOB:  06/02/1956, LOS: 58 ADMISSION DATE:  11/28/2018, CONSULTATION DATE:  1/3 REFERRING MD:  EDP, CHIEF COMPLAINT:  Dyspnea   Brief History   63 year old male with influenza A causing respiratory failure and cardiogenic shock.  Prolonged mechanical ventilation, tracheostomy placed on January 16.  Past Medical History  DM2, CAD, HTN  Significant Hospital Events   1/6 self extubated 1/7 re-intubated 1/8 self extubated 1/9 acute respiratory failure, mucus plugging right lower lobe, re-intubated, bronchoscopy 1/10 New fever T Max 103.1, antibiotics resumed with ceftaz and vancomycin 1/11: Hemodynamically seems to be improving. Very hypernatremic. X-ray improving. Adding free water, one-time Lasix, follow-up chemistry a.m. Hoping to initiate weaning efforts on 1/12 1/16. Continuing diuresis and ongoing ventilatory support. Working on weaning but still has significant work of breathing. Plan has been to proceed with tracheostomy. Hopefully we can continue ongoing diuresis and continue to decrease sedation 1/20 still struggling to wean. Secretions and anxiety are barriers.  1/21 trach revision to 6 distal XLT, fever, increased secretions, abx broadened / bronch 1/22 ongoing thick secretions 1/24 Failed SBT, low grade fever, Per CXR persistent pleural effusions / basilar consolidation atelectasis vs pneumonia per bases 1/25>> Mucus plugged with slow recovery after chest PT and Wound Care  Consults:  PCCM Cardiology   Procedures:  ETT 1/3 >>1/6, 1/7 > 1/8> 1/9>> 1/16 1/5 Right TL PICC >> 1/9 Bronch>> Mucus plugging RLL Trach 1/16 (JY) >>1/20 trach change failed and #4 cuffed 1/21 trach upsized to 6 cuffed XLT distal  1/17 right nare cortrak >> 1/29 1/28 gastrostomy tube >   Significant Diagnostic Tests:  UDS 1/4 >>negative   ECHO 1/4 >>LVEF 20-25%, akinesis of the anteroseptal, anterolateral & apical myocardium, LVEF  19-75%, grade 1 diastolic dysfunction, small pericardial effusion without evidence of hemodynamic compromise  CT Chest / ABD / Pelvis 1/3 >> airway thickening, motion artifact, CAD  Echo 1/29>> LVEF 60-65%, left ventricular hypertrophy, trivial pericardial effusion  Micro Data:  RVP 1/3 >> positive for influenza A BCx2 1/3 >>No growth UC 1/4 >>No Growth Sputum Cx 1/7 >> Normal respiratory flora BAL >>corynebacterium  Blood Cx 1/10 > neg Sputum 1/21 > abundant corynebacterium striatum  Antimicrobials:  Vanco 1/3 x1;    Zosyn 1/30 x1  Tamiflu 1/3 >1/8 1/10 vancomycin>1/12 1/10 ceftaz >1/12;   1/21 >>1/22 1/12 emycin > 1/22 1/23 cefepime >> 1/24 1/21 Vanc> 1/28  Interim history/subjective:  Continues on pressure support weans.  Physical therapy is working with him today to mobilize him. Had an episode of emesis last night.  Tube feeds are off.  Objective   Blood pressure 123/72, pulse 95, temperature 98.8 F (37.1 C), temperature source Oral, resp. rate (!) 22, height 6' (1.829 m), weight 92.3 kg, SpO2 97 %. CVP:  [6 mmHg-8 mmHg] 8 mmHg  Vent Mode: PRVC FiO2 (%):  [40 %] 40 % Set Rate:  [22 bmp] 22 bmp Vt Set:  [620 mL] 620 mL PEEP:  [5 cmH20] 5 cmH20 Plateau Pressure:  [14 cmH20-20 cmH20] 20 cmH20   Intake/Output Summary (Last 24 hours) at 12/26/2018 1049 Last data filed at 12/26/2018 1000 Gross per 24 hour  Intake 368.11 ml  Output 775 ml  Net -406.89 ml   Filed Weights   12/24/18 0500 12/25/18 0420 12/26/18 0500  Weight: 98 kg 95.3 kg 92.3 kg    Examination:. Gen:      No acute distress HEENT:  EOMI, sclera anicteric Neck:  No masses; no thyromegaly, trach in place Lungs:    Clear to auscultation bilaterally; normal respiratory effort CV:         Regular rate and rhythm; no murmurs Abd:      + bowel sounds; soft, non-tender; no palpable masses, no distension Ext:    Right BKA Skin:      Warm and dry; no rash Neuro: Awake, responsive  Resolved  Hospital Problem list   AKI Pneumonia Hypokalemia Hypernatremia  Assessment & Plan:  Acute respiratory failure with hypoxemia Prolonged respiratory failure after flu a infection, Corynebacterium pneumonia. Continue pressure support weans Trach care, pulmonary toilet We will attempt to wean to trach collar Continue physical therapy  Acute systolic heart failure Follow-up echo shows recovery of EF to normal range No plans for cath per cardiology Will need follow-up as an outpatient Continue Coreg, cozaar, Lipitor  Nutrition S/p PEG by IR Tube feeds.  Acute metabolic encephalopathy : dramatically improved, precedex off Was on a clonidine taper> now off. Continue Seroquel, Ativan as needed  DM2 Continue SSI, Levemir  Emesis Resume tube feeds at trickle. Abdominal x-ray if it recurs.  Best practice:  Diet: tube feeding Pain/Anxiety/Delirium protocol (if indicated): as above, RASS goal 0 VAP protocol (if indicated): yes DVT prophylaxis: lovenox GI prophylaxis: famotidine Glucose control: SSI/Detemir Mobility: PT working with him Code Status: Full Family Communication: Mother and son updated 1/31. Disposition: Doesn't have LTAC benefits, weaning well this week so hoping to avoid vent-SNF  Labs   CBC: Recent Labs  Lab 12/20/18 0500 12/21/18 0500 12/22/18 0446 12/26/18 0512  WBC 8.7 7.7 10.1 7.0  HGB 9.0* 8.8* 9.3* 9.2*  HCT 29.9* 29.1* 30.1* 29.6*  MCV 94.6 94.2 91.5 86.5  PLT 344 345 355 474    Basic Metabolic Panel: Recent Labs  Lab 12/20/18 0500 12/21/18 0500 12/22/18 0446 12/23/18 0531 12/26/18 0512  NA 144 144 142 138 139  K 3.6 4.0 3.7 3.8 3.8  CL 110 110 108 104 105  CO2 _0 GLUCOSE 72 140* 81 118* 116*  BUN 26* 24* 24* 22* 29*  CREATININE 0.80 0.83 0.79 0.86 0.78  CALCIUM 8.2* 8.3* 8.3* 8.2* 8.7*  MG 2.3 2.3 2.2  --  1.8  PHOS 4.7* 4.6  --   --  4.5   GFR: Estimated Creatinine Clearance: 107.8 mL/min (by C-G formula based  on SCr of 0.78 mg/dL). Recent Labs  Lab 12/20/18 0500 12/21/18 0500 12/22/18 0446 12/26/18 0512  WBC 8.7 7.7 10.1 7.0    Liver Function Tests: No results for input(s): AST, ALT, ALKPHOS, BILITOT, PROT, ALBUMIN in the last 168 hours. No results for input(s): LIPASE, AMYLASE in the last 168 hours. No results for input(s): AMMONIA in the last 168 hours.  ABG    Component Value Date/Time   PHART 7.282 (L) 12/07/2018 0349   PCO2ART 50.2 (H) 12/07/2018 0349   PO2ART 59.0 (L) 12/07/2018 0349   HCO3 23.4 12/07/2018 0349   TCO2 25 12/07/2018 0349   ACIDBASEDEF 3.0 (H) 12/07/2018 0349   O2SAT 84.0 12/07/2018 0349     Coagulation Profile: Recent Labs  Lab 12/23/18 0531  INR 1.26    Cardiac Enzymes: No results for input(s): CKTOTAL, CKMB, CKMBINDEX, TROPONINI in the last 168 hours.  HbA1C: Hgb A1c MFr Bld  Date/Time Value Ref Range Status  03/25/2017 05:59 AM 12.3 (H) 4.8 - 5.6 % Final    Comment:    (NOTE)  Pre-diabetes: 5.7 - 6.4         Diabetes: >6.4         Glycemic control for adults with diabetes: <7.0   12/28/2015 08:50 PM 11.7 (H) 4.8 - 5.6 % Final    Comment:    (NOTE)         Pre-diabetes: 5.7 - 6.4         Diabetes: >6.4         Glycemic control for adults with diabetes: <7.0     CBG: Recent Labs  Lab 12/25/18 1957 12/25/18 2028 12/25/18 2334 12/26/18 0338 12/26/18 0754  GLUCAP 63* 120* 116* 98 134*    The patient is critically ill with multiple organ system failure and requires high complexity decision making for assessment and support, frequent evaluation and titration of therapies, advanced monitoring, review of radiographic studies and interpretation of complex data.   Critical Care Time devoted to patient care services, exclusive of separately billable procedures, described in this note is 35 minutes.   Marshell Garfinkel MD [facility] Pager [phone] If no answer call [phone] 12/26/2018, 11:01  AM

## 2018-12-26 NOTE — Progress Notes (Signed)
Physical Therapy Treatment Patient Details Name: Antonio [name]. MRN: [id number] DOB: 12-May-1956 Today's Date: 12/26/2018    History of Present Illness 63 yo admitted with flu A and PNA with acute respiratory failure intubated 1/4, trach 1/16 with course complicated by acute MI, CHF and cardiogenic shock and mucous plugging. PMhx: DM, HTN, HLD, CAD, Lt BKA, rt toe amputation    PT Comments    Pt lethargic on arrival but able to arouse and willing to mobilize. Pt incontinent of stool on arrival with need for pericare in standing on last 2 trials. Neoprene sleeve and right shoe donned for pt in bed with pt able to don prosthesis on his own EOB. Greatly improved ability with standing transfer with prosthesis on this session with and without Stedy with increased standing time to grossly 30 sec each trial. Pt remains on vent with PRVC 40% and RR 22-38 during session with SpO2 100%. HR 100    Follow Up Recommendations  LTACH;Supervision/Assistance - 24 hour     Equipment Recommendations  Other (comment)(defer to next venue)    Recommendations for Other Services       Precautions / Restrictions Precautions Precautions: Fall Precaution Comments: trach/vent, L BKA    Mobility  Bed Mobility Overal bed mobility: Needs Assistance Bed Mobility: Supine to Sit     Supine to sit: Min assist;HOB elevated     General bed mobility comments: HOB 30 degrees with min HHA to fully elevate trunk from surface, mod assist to fully pivot hips to EOB  Transfers Overall transfer level: Needs assistance   Transfers: Sit to/from Stand Sit to Stand: Mod assist;+2 physical assistance;From elevated surface         General transfer comment: mod +2 assist to stand from bed with bil UE support and prosthesis donned grossly 30 sec with cues for upright posture. Pt then sat and stood 2 addiitonal times with Stedy with min +2 assist with cues for hand placement and sequence, Use of stedy to pivot to  chair  Ambulation/Gait             General Gait Details: not yet able on vent   Stairs             Wheelchair Mobility    Modified Rankin (Stroke Patients Only)       Balance Overall balance assessment: Needs assistance Sitting-balance support: No upper extremity supported;Feet supported Sitting balance-Leahy Scale: Fair Sitting balance - Comments: minguard EOB   Standing balance support: Bilateral upper extremity supported Standing balance-Leahy Scale: Poor Standing balance comment: bil UE supported with right lean                            Cognition Arousal/Alertness: Awake/alert Behavior During Therapy: Flat affect Overall Cognitive Status: Impaired/Different from baseline Area of Impairment: Safety/judgement;Problem solving;Attention                   Current Attention Level: Selective   Following Commands: Follows one step commands consistently;Follows multi-step commands with increased time Safety/Judgement: Decreased awareness of deficits;Decreased awareness of safety   Problem Solving: Slow processing;Difficulty sequencing;Requires verbal cues;Requires tactile cues General Comments: pt lethargic on arrival but able to arouse and follow commands for mobility today. PT aware of son and mother in room when attention directed to them      Exercises      General Comments        Pertinent Vitals/Pain Pain Score: 5  Pain Location: sacrum with positioning Pain Descriptors / Indicators: Grimacing Pain Intervention(s): Monitored during session;Repositioned    Home Living                      Prior Function            PT Goals (current goals can now be found in the care plan section) Acute Rehab PT Goals Time For Goal Achievement: 01/09/19 Potential to Achieve Goals: Fair Progress towards PT goals: Progressing toward goals(goals updated)    Frequency    Min 3X/week      PT Plan Current plan remains  appropriate    Co-evaluation              AM-PAC PT "6 Clicks" Mobility   Outcome Measure  Help needed turning from your back to your side while in a flat bed without using bedrails?: A Little Help needed moving from lying on your back to sitting on the side of a flat bed without using bedrails?: A Little Help needed moving to and from a bed to a chair (including a wheelchair)?: A Lot Help needed standing up from a chair using your arms (e.g., wheelchair or bedside chair)?: A Lot Help needed to walk in hospital room?: Total Help needed climbing 3-5 steps with a railing? : Total 6 Click Score: 12    End of Session Equipment Utilized During Treatment: Gait belt Activity Tolerance: Patient tolerated treatment well Patient left: in chair;with call bell/phone within reach;with chair alarm set;Other (comment)(geomat) Nurse Communication: Mobility status;Need for lift equipment(recommend stedy) PT Visit Diagnosis: Other abnormalities of gait and mobility (R26.89);Muscle weakness (generalized) (M62.81);Unsteadiness on feet (R26.81)     Time: 1638-4665 PT Time Calculation (min) (ACUTE ONLY): 33 min  Charges:  $Therapeutic Activity: 23-37 mins                     Sallie Maker Abner Greenspan, PT Acute Rehabilitation Services Pager: [phone] Office: [phone]    Enedina Finner Lizzete Gough 12/26/2018, 12:46 PM

## 2018-12-26 NOTE — Progress Notes (Signed)
RT NOTE:  Unable to perform bed CPT due to pt sleeping. Will attempt at a later time.

## 2018-12-26 NOTE — Care Management Note (Signed)
Case Management Note  Patient Details  Name: Antonio [name]. MRN: [id number] Date of Birth: 07/06/1956  Subjective/Objective:  63 yo admitted with flu A and PNA with acute respiratory failure intubated 1/4, trach 1/16 with course complicated by acute MI, CHF and cardiogenic shock.  PTA, pt resided at home with parents.                  Action/Plan: Pt not weaning to TC yet, and will likely need vent SNF.  CSW has spoken with family about this, and they are in agreement.  They do understand that placement will likely be in [place], but are hopeful that pt will improve and wean to [facility] while waiting on [place] [facility].  Will follow progress.  Expected Discharge Date:                  Expected Discharge Plan:  Skilled Nursing Facility  In-House Referral:  Clinical Social Work  Discharge planning Services  CM Consult  Post Acute Care Choice:    Choice offered to:     DME Arranged:    DME Agency:     HH Arranged:    HH Agency:     Status of Service:  In process, will continue to follow  If discussed at Long Length of Stay Meetings, dates discussed:    Additional Comments:  Quintella Baton, RN, BSN  Trauma/Neuro ICU Case Manager [phone]

## 2018-12-26 NOTE — Progress Notes (Signed)
Physical Therapy Wound Treatment Patient Details  Name: Antonio [name]. MRN: [id number] Date of Birth: February 15, 1956  Today's Date: 12/26/2018 Time:  -     Subjective  Subjective: Pt mouthing, "can you cover my legs?" Patient and Family Stated Goals: none stated Date of Onset: (unknown) Prior Treatments: dressing changes  Pain Score:  Pt grimacing during pulsative lavage; Pre-medicated  Wound Assessment  Pressure Injury 12/01/18 Unstageable - Full thickness tissue loss in which the base of the ulcer is covered by slough (yellow, tan, gray, green or brown) and/or eschar (tan, brown or black) in the wound bed. sacrum (Active)  Dressing Type Gauze (Comment);Moist to dry;ABD;Other (Comment);Barrier Film (skin prep) 12/26/2018 10:39 AM  Dressing Clean;Dry;Intact;Changed 12/26/2018 10:39 AM  Dressing Change Frequency Daily 12/26/2018 10:39 AM  State of Healing Non-healing 12/26/2018 10:39 AM  Site / Wound Assessment Red;Yellow;Pink 12/26/2018 10:39 AM  % Wound base Red or Granulating 10% 12/26/2018 10:39 AM  % Wound base Yellow/Fibrinous Exudate 90% 12/26/2018 10:39 AM  % Wound base Black/Eschar 0% 12/26/2018 10:39 AM  % Wound base Other/Granulation Tissue (Comment) 0% 12/26/2018 10:39 AM  Peri-wound Assessment Purple;Excoriated;Erythema (blanchable) 12/26/2018 10:39 AM  Wound Length (cm) 8 cm 12/25/2018 10:07 AM  Wound Width (cm) 5 cm 12/25/2018 10:07 AM  Wound Depth (cm) 0.1 cm 12/25/2018 10:07 AM  Wound Surface Area (cm^2) 40 cm^2 12/25/2018 10:07 AM  Wound Volume (cm^3) 4 cm^3 12/25/2018 10:07 AM  Tunneling (cm) 2.5cm at 6:oo on clock 12/11/2018  8:00 PM  Margins Unattached edges (unapproximated) 12/26/2018 10:39 AM  Drainage Amount Minimal 12/26/2018 10:39 AM  Drainage Description Serosanguineous 12/26/2018 10:39 AM  Treatment Debridement (Selective);Hydrotherapy (Pulse lavage);Packing (Saline gauze) 12/26/2018 10:39 AM      Hydrotherapy Pulsed lavage therapy - wound location: sacrum Pulsed  Lavage with Suction (psi): 8 psi(8-12) Pulsed Lavage with Suction - Normal Saline Used: 1000 mL Pulsed Lavage Tip: Tip with splash shield Selective Debridement Selective Debridement - Location: sacrum Selective Debridement - Tools Used: Forceps;Scalpel Selective Debridement - Tissue Removed: yellow unviable tissue   Wound Assessment and Plan  Wound Therapy - Assess/Plan/Recommendations Wound Therapy - Clinical Statement: No blue/green drainage noted this session. Pt able to tolerate minimal debridement today. Slow progress with removal of slough. Wound Therapy - Functional Problem List: decr mobility Factors Delaying/Impairing Wound Healing: Diabetes Mellitus;Immobility;Multiple medical problems Hydrotherapy Plan: Debridement;Dressing change;Patient/family education;Pulsatile lavage with suction Wound Therapy - Frequency: 6X / week Wound Therapy - Follow Up Recommendations: Other (comment)(LTACH) Wound Plan: see above  Wound Therapy Goals- Improve the function of patient's integumentary system by progressing the wound(s) through the phases of wound healing (inflammation - proliferation - remodeling) by: Decrease Necrotic Tissue to: 80 Decrease Necrotic Tissue - Progress: Progressing toward goal Increase Granulation Tissue to: 20 Increase Granulation Tissue - Progress: Progressing toward goal Goals/treatment plan/discharge plan were made with and agreed upon by patient/family: Yes Time For Goal Achievement: 7 days Wound Therapy - Potential for Goals: Fair  Goals will be updated until maximal potential achieved or discharge criteria met.  Discharge criteria: when goals achieved, discharge from hospital, MD decision/surgical intervention, no progress towards goals, refusal/missing three consecutive treatments without notification or medical reason.  GP     [name], PT, DPT Acute Rehabilitation Services Pager [phone] Office [phone]  [name] 12/26/2018,  10:44 AM

## 2018-12-26 NOTE — Progress Notes (Signed)
SLP Cancellation Note  Patient Details Name: Antonio [name]. MRN: [id number] DOB: 11-18-1956   Cancelled treatment:         Checked in with RN, RTT re: inline PMV attempt. Pt just got to sleep- had planned to check back this afternoon and this therapist was unable to. Will continue efforts.  Royce Macadamia 12/26/2018, 4:17 PM  Breck Coons Lonell Face.Ed Nurse, children's [phone] Office [phone]

## 2018-12-27 LAB — GLUCOSE, CAPILLARY
GLUCOSE-CAPILLARY: 186 mg/dL — AB (ref 70–99)
Glucose-Capillary: 76 mg/dL (ref 70–99)
Glucose-Capillary: 119 mg/dL — ABNORMAL HIGH (ref 70–99)
Glucose-Capillary: 254 mg/dL — ABNORMAL HIGH (ref 70–99)
Glucose-Capillary: 210 mg/dL — ABNORMAL HIGH (ref 70–99)

## 2018-12-27 MED ORDER — HALOPERIDOL LACTATE 5 MG/ML IJ SOLN
1.0000 mg | Freq: Once | INTRAMUSCULAR | Status: AC
  Administered 2018-12-27: 1 mg via INTRAVENOUS
  Filled 2018-12-27: qty 1

## 2018-12-27 NOTE — Progress Notes (Signed)
Physical Therapy Wound Treatment Patient Details  Name: Antonio [name]. MRN: [id number] Date of Birth: Mar 09, 1956  Today's Date: 12/27/2018 Time: 0824-0900 Time Calculation (min): 36 min  Subjective  Subjective: Pt nodding head in agreement to hydrotherapy Patient and Family Stated Goals: none stated Date of Onset: (unknown) Prior Treatments: dressing changes  Pain Score:  Pt flinching during pulsatile lavage; Pre medicated  Wound Assessment  Pressure Injury 12/01/18 Unstageable - Full thickness tissue loss in which the base of the ulcer is covered by slough (yellow, tan, gray, green or brown) and/or eschar (tan, brown or black) in the wound bed. sacrum (Active)  Dressing Type Gauze (Comment);Moist to dry;ABD;Other (Comment);Barrier Film (skin prep) 12/27/2018  4:15 PM  Dressing Clean;Dry;Intact;Changed 12/27/2018  4:15 PM  Dressing Change Frequency Daily 12/27/2018  4:15 PM  State of Healing Non-healing 12/27/2018  4:15 PM  Site / Wound Assessment Red;Yellow;Pink 12/27/2018  4:15 PM  % Wound base Red or Granulating 10% 12/27/2018  4:15 PM  % Wound base Yellow/Fibrinous Exudate 90% 12/27/2018  4:15 PM  % Wound base Black/Eschar 0% 12/27/2018  4:15 PM  % Wound base Other/Granulation Tissue (Comment) 0% 12/27/2018  4:15 PM  Peri-wound Assessment Purple;Excoriated;Erythema (blanchable) 12/27/2018  4:15 PM  Wound Length (cm) 8 cm 12/25/2018 10:07 AM  Wound Width (cm) 5 cm 12/25/2018 10:07 AM  Wound Depth (cm) 0.1 cm 12/25/2018 10:07 AM  Wound Surface Area (cm^2) 40 cm^2 12/25/2018 10:07 AM  Wound Volume (cm^3) 4 cm^3 12/25/2018 10:07 AM  Tunneling (cm) 2.5cm at 6:oo on clock 12/11/2018  8:00 PM  Margins Unattached edges (unapproximated) 12/27/2018  4:15 PM  Drainage Amount Minimal 12/27/2018  4:15 PM  Drainage Description Serosanguineous 12/27/2018  4:15 PM  Treatment Hydrotherapy (Pulse lavage) 12/27/2018  4:15 PM      Hydrotherapy Pulsed lavage therapy - wound location: sacrum Pulsed Lavage with Suction  (psi): 8 psi(8-12) Pulsed Lavage with Suction - Normal Saline Used: 1000 mL Pulsed Lavage Tip: Tip with splash shield   Wound Assessment and Plan  Wound Therapy - Assess/Plan/Recommendations Wound Therapy - Clinical Statement: Pt with poor tolerance to hydrotherapy today, with increased respiration rate, desaturation, and becoming diaphoretic lying on R side; RT present to adjust ventilator settings. Performed pulsatile lavage and dressing change; deferred debridement. No blue/green drainage noted. Slow progress with removal of slough. Wound Therapy - Functional Problem List: decr mobility Factors Delaying/Impairing Wound Healing: Diabetes Mellitus;Immobility;Multiple medical problems Hydrotherapy Plan: Debridement;Dressing change;Patient/family education;Pulsatile lavage with suction Wound Therapy - Frequency: 6X / week Wound Therapy - Follow Up Recommendations: Other (comment)(LTACH) Wound Plan: see above  Wound Therapy Goals- Improve the function of patient's integumentary system by progressing the wound(s) through the phases of wound healing (inflammation - proliferation - remodeling) by: Decrease Necrotic Tissue to: 80 Decrease Necrotic Tissue - Progress: Progressing toward goal Increase Granulation Tissue to: 20 Increase Granulation Tissue - Progress: Progressing toward goal Goals/treatment plan/discharge plan were made with and agreed upon by patient/family: Yes Time For Goal Achievement: 7 days Wound Therapy - Potential for Goals: Fair  Goals will be updated until maximal potential achieved or discharge criteria met.  Discharge criteria: when goals achieved, discharge from hospital, MD decision/surgical intervention, no progress towards goals, refusal/missing three consecutive treatments without notification or medical reason.  GP    [name], PT, DPT Acute Rehabilitation Services Pager [phone] Office [phone]   [name] 12/27/2018, 4:25 PM

## 2018-12-27 NOTE — Progress Notes (Signed)
eLink Physician-Brief Progress Note Patient Name: Antonio [name]. DOB: 20-Sep-1956 MRN: [id number]   Date of Service  12/27/2018  HPI/Events of Note  Agitation - QTc interval = 0.27 seconds.   eICU Interventions  Will order: 1. Haldol 1 mg IV now.      Intervention Category Major Interventions: Delirium, psychosis, severe agitation - evaluation and management  Roni Friberg Eugene 12/27/2018, 11:03 PM

## 2018-12-27 NOTE — Progress Notes (Addendum)
NAME:  Antonio Cease., MRN:  [id number], DOB:  1956-08-01, LOS: 80 ADMISSION DATE:  11/28/2018, CONSULTATION DATE:  1/3 REFERRING MD:  EDP, CHIEF COMPLAINT:  Dyspnea   Brief History   63 year old male with influenza A causing respiratory failure and cardiogenic shock.  Prolonged mechanical ventilation, tracheostomy placed on January 16.  Past Medical History  DM2, CAD, HTN  Significant Hospital Events   1/6 self extubated 1/7 re-intubated 1/8 self extubated 1/9 acute respiratory failure, mucus plugging right lower lobe, re-intubated, bronchoscopy 1/10 New fever T Max 103.1, antibiotics resumed with ceftaz and vancomycin 1/11: Hemodynamically seems to be improving. Very hypernatremic. X-ray improving. Adding free water, one-time Lasix, follow-up chemistry a.m. Hoping to initiate weaning efforts on 1/12 1/16. Continuing diuresis and ongoing ventilatory support. Working on weaning but still has significant work of breathing. Plan has been to proceed with tracheostomy. Hopefully we can continue ongoing diuresis and continue to decrease sedation 1/20 still struggling to wean. Secretions and anxiety are barriers.  1/21 trach revision to 6 distal XLT, fever, increased secretions, abx broadened / bronch 1/22 ongoing thick secretions 1/24 Failed SBT, low grade fever, Per CXR persistent pleural effusions / basilar consolidation atelectasis vs pneumonia per bases 1/25>> Mucus plugged with slow recovery after chest PT and Wound Care  Consults:  PCCM Cardiology   Procedures:  ETT 1/3 >>1/6, 1/7 > 1/8> 1/9>> 1/16 1/5 Right TL PICC >> 1/9 Bronch>> Mucus plugging RLL Trach 1/16 (JY) >>1/20 trach change failed and #4 cuffed 1/21 trach upsized to 6 cuffed XLT distal  1/17 right nare cortrak >> 1/29 1/28 gastrostomy tube >   Significant Diagnostic Tests:  UDS 1/4 >>negative   ECHO 1/4 >>LVEF 20-25%, akinesis of the anteroseptal, anterolateral & apical myocardium, LVEF  40-98%, grade 1 diastolic dysfunction, small pericardial effusion without evidence of hemodynamic compromise  CT Chest / ABD / Pelvis 1/3 >> airway thickening, motion artifact, CAD  Echo 1/29>> LVEF 60-65%, left ventricular hypertrophy, trivial pericardial effusion  Micro Data:  RVP 1/3 >> positive for influenza A BCx2 1/3 >>No growth UC 1/4 >>No Growth Sputum Cx 1/7 >> Normal respiratory flora BAL >>corynebacterium  Blood Cx 1/10 > neg Sputum 1/21 > abundant corynebacterium striatum  Antimicrobials:  Vanco 1/3 x1;    Zosyn 1/30 x1  Tamiflu 1/3 >1/8 1/10 vancomycin>1/12 1/10 ceftaz >1/12;   1/21 >>1/22 1/12 emycin > 1/22 1/23 cefepime >> 1/24 1/21 Vanc> 1/28  Interim history/subjective:  Doing well today.  No issues overnight.  Remains on mechanical ventilation with support in PRVC.  Patient was unable to tolerate SBT.  Objective   Blood pressure (!) 150/135, pulse 94, temperature 99.2 F (37.3 C), temperature source Oral, resp. rate (!) 22, height 6' (1.829 m), weight 92.1 kg, SpO2 (!) 89 %. CVP:  [13 mmHg-14 mmHg] 13 mmHg  Vent Mode: PRVC FiO2 (%):  [40 %] 40 % Set Rate:  [22 bmp] 22 bmp Vt Set:  [620 mL] 620 mL PEEP:  [5 cmH20] 5 cmH20 Pressure Support:  [10 cmH20] 10 cmH20 Plateau Pressure:  [13 cmH20-20 cmH20] 13 cmH20   Intake/Output Summary (Last 24 hours) at 12/27/2018 1056 Last data filed at 12/27/2018 0600 Gross per 24 hour  Intake 90 ml  Output 1200 ml  Net -1110 ml   Filed Weights   12/25/18 0420 12/26/18 0500 12/27/18 0500  Weight: 95.3 kg 92.3 kg 92.1 kg    Examination:. General appearance: 63 y.o., male, NAD, conversant, is able to communicate mouthing words Eyes:  anicteric sclerae, moist conjunctivae; pupils reactive, tracking appropriately HENT: NCAT; membranes dry Neck: Trachea midline; tracheostomy tube in place, secretions around this.  Nursing staff stated they are exchanging this quite frequently due to the secretions. Lungs: Bilateral  ventilated breath sounds, no crackles, no wheeze CV: Regular rate and rhythm, S1-S2, no MRG Abdomen: Soft, non-tender; mildly distended, G-tube in place, abdominal binder in place Extremities: No peripheral edema, radial and DP pulses present bilaterally  Skin: Normal temperature, turgor and texture; no rash Psych: Appropriate affect  Neuro: Alert and oriented to person and place, no focal deficit   Resolved Hospital Problem list   AKI Pneumonia Hypokalemia Hypernatremia  Assessment & Plan:  Acute respiratory failure with hypoxemia Prolonged respiratory failure after flu a infection, Corynebacterium pneumonia. Attempts today again for pressure support weaning trials. Continue trach care and pulmonary toileting Attempt to wean to trach collar again today if possible. Continue physical therapy and early mobility as this will improve his outcomes.  Acute systolic heart failure Follow-up echocardiogram with improvement of his ejection fraction. At this point no plans for cardiac catheterization per cardiology. Continue Coreg and Cozaar Continue Lipitor. Will need outpatient follow-up.  Nutrition status post gastrostomy tube placement per interventional radiology Continue tube feeds.  Acute metabolic encephalopathy  Continue Seroquel Order ECG to follow-up on QTC. Hold all sedating medications at this point.  DM2 SSI and Levemir  Emesis Continue tube feeds.  And will observe any additional nausea vomiting. PRN antiemetics if needed.  Best practice:  Diet: tube feeding Pain/Anxiety/Delirium protocol (if indicated): as above, RASS goal 0 VAP protocol (if indicated): yes DVT prophylaxis: lovenox GI prophylaxis: famotidine Glucose control: SSI/Detemir Mobility: PT working with him Code Status: Full Family Communication: Mother and son updated 1/31. Disposition: Doesn't have LTAC benefits, weaning well this week so hoping to avoid vent-SNF  Labs   CBC: Recent Labs    Lab 12/21/18 0500 12/22/18 0446 12/26/18 0512  WBC 7.7 10.1 7.0  HGB 8.8* 9.3* 9.2*  HCT 29.1* 30.1* 29.6*  MCV 94.2 91.5 86.5  PLT 345 355 093    Basic Metabolic Panel: Recent Labs  Lab 12/21/18 0500 12/22/18 0446 12/23/18 0531 12/26/18 0512  NA 144 142 138 139  K 4.0 3.7 3.8 3.8  CL 110 108 104 105  CO2 _0 GLUCOSE 140* 81 118* 116*  BUN 24* 24* 22* 29*  CREATININE 0.83 0.79 0.86 0.78  CALCIUM 8.3* 8.3* 8.2* 8.7*  MG 2.3 2.2  --  1.8  PHOS 4.6  --   --  4.5   GFR: Estimated Creatinine Clearance: 107.8 mL/min (by C-G formula based on SCr of 0.78 mg/dL). Recent Labs  Lab 12/21/18 0500 12/22/18 0446 12/26/18 0512  WBC 7.7 10.1 7.0    Liver Function Tests: No results for input(s): AST, ALT, ALKPHOS, BILITOT, PROT, ALBUMIN in the last 168 hours. No results for input(s): LIPASE, AMYLASE in the last 168 hours. No results for input(s): AMMONIA in the last 168 hours.  ABG    Component Value Date/Time   PHART 7.282 (L) 12/07/2018 0349   PCO2ART 50.2 (H) 12/07/2018 0349   PO2ART 59.0 (L) 12/07/2018 0349   HCO3 23.4 12/07/2018 0349   TCO2 25 12/07/2018 0349   ACIDBASEDEF 3.0 (H) 12/07/2018 0349   O2SAT 84.0 12/07/2018 0349     Coagulation Profile: Recent Labs  Lab 12/23/18 0531  INR 1.26    Cardiac Enzymes: No results for input(s): CKTOTAL, CKMB, CKMBINDEX, TROPONINI in the last 168  hours.  HbA1C: Hgb A1c MFr Bld  Date/Time Value Ref Range Status  03/25/2017 05:59 AM 12.3 (H) 4.8 - 5.6 % Final    Comment:    (NOTE)         Pre-diabetes: 5.7 - 6.4         Diabetes: >6.4         Glycemic control for adults with diabetes: <7.0   12/28/2015 08:50 PM 11.7 (H) 4.8 - 5.6 % Final    Comment:    (NOTE)         Pre-diabetes: 5.7 - 6.4         Diabetes: >6.4         Glycemic control for adults with diabetes: <7.0     CBG: Recent Labs  Lab 12/26/18 1613 12/26/18 1951 12/26/18 2325 12/27/18 0311 12/27/18 0833  GLUCAP 127* 80 71 76 119*     Gretchen Portela Pulmonary Critical Care 12/27/2018 10:57 AM  Personal pager: [phone] If unanswered, please page CCM On-call: [phone]

## 2018-12-28 LAB — GLUCOSE, CAPILLARY
Glucose-Capillary: 101 mg/dL — ABNORMAL HIGH (ref 70–99)
Glucose-Capillary: 125 mg/dL — ABNORMAL HIGH (ref 70–99)
Glucose-Capillary: 131 mg/dL — ABNORMAL HIGH (ref 70–99)
Glucose-Capillary: 142 mg/dL — ABNORMAL HIGH (ref 70–99)
Glucose-Capillary: 157 mg/dL — ABNORMAL HIGH (ref 70–99)
Glucose-Capillary: 164 mg/dL — ABNORMAL HIGH (ref 70–99)
Glucose-Capillary: 201 mg/dL — ABNORMAL HIGH (ref 70–99)

## 2018-12-28 NOTE — Progress Notes (Signed)
NAME:  Antonio [name]., MRN:  [id number], DOB:  [birth year], LOS: 25 ADMISSION DATE:  11/28/2018, CONSULTATION DATE:  1/3 REFERRING MD:  EDP, CHIEF COMPLAINT:  Dyspnea   Brief History   63 year old male with influenza A causing respiratory failure and cardiogenic shock.  Prolonged mechanical ventilation, tracheostomy placed on January 16.  Past Medical History  DM2, CAD, HTN  Significant Hospital Events   1/6 self extubated 1/7 re-intubated 1/8 self extubated 1/9 acute respiratory failure, mucus plugging right lower lobe, re-intubated, bronchoscopy 1/10 New fever T Max 103.1, antibiotics resumed with ceftaz and vancomycin 1/11: Hemodynamically seems to be improving. Very hypernatremic. X-ray improving. Adding free water, one-time Lasix, follow-up chemistry a.m. Hoping to initiate weaning efforts on 1/12 1/16. Continuing diuresis and ongoing ventilatory support. Working on weaning but still has significant work of breathing. Plan has been to proceed with tracheostomy. Hopefully we can continue ongoing diuresis and continue to decrease sedation 1/20 still struggling to wean. Secretions and anxiety are barriers.  1/21 trach revision to 6 distal XLT, fever, increased secretions, abx broadened / bronch 1/22 ongoing thick secretions 1/24 Failed SBT, low grade fever, Per CXR persistent pleural effusions / basilar consolidation atelectasis vs pneumonia per bases 1/25>> Mucus plugged with slow recovery after chest PT and Wound Care  Consults:  PCCM Cardiology   Procedures:  ETT 1/3 >>1/6, 1/7 > 1/8> 1/9>> 1/16 1/5 Right TL PICC >> 1/9 Bronch>> Mucus plugging RLL Trach 1/16 (JY) >>1/20 trach change failed and #4 cuffed 1/21 trach upsized to 6 cuffed XLT distal  1/17 right nare cortrak >> 1/29 1/28 gastrostomy tube >   Significant Diagnostic Tests:  UDS 1/4 >>negative   ECHO 1/4 >>LVEF 20-25%, akinesis of the anteroseptal, anterolateral & apical myocardium, LVEF  43-15%, grade 1 diastolic dysfunction, small pericardial effusion without evidence of hemodynamic compromise  CT Chest / ABD / Pelvis 1/3 >> airway thickening, motion artifact, CAD  Echo 1/29>> LVEF 60-65%, left ventricular hypertrophy, trivial pericardial effusion  Micro Data:  RVP 1/3 >> positive for influenza A BCx2 1/3 >>No growth UC 1/4 >>No Growth Sputum Cx 1/7 >> Normal respiratory flora BAL >>corynebacterium  Blood Cx 1/10 > neg Sputum 1/21 > abundant corynebacterium striatum  Antimicrobials:  Vanco 1/3 x1;    Zosyn 1/30 x1  Tamiflu 1/3 >1/8 1/10 vancomycin>1/12 1/10 ceftaz >1/12;   1/21 >>1/22 1/12 emycin > 1/22 1/23 cefepime >> 1/24 1/21 Vanc> 1/28  Interim history/subjective:  Again episode of vomiting this morning.  Tube feeds held for short period.  Objective   Blood pressure 118/72, pulse 100, temperature 98.8 F (37.1 C), temperature source Oral, resp. rate (!) 22, height 6' (1.829 m), weight 92.4 kg, SpO2 97 %. CVP:  [29 mmHg] 29 mmHg  Vent Mode: PRVC FiO2 (%):  [40 %] 40 % Set Rate:  [22 bmp] 22 bmp Vt Set:  [620 mL] 620 mL PEEP:  [5 cmH20] 5 cmH20 Pressure Support:  [12 cmH20] 12 cmH20 Plateau Pressure:  [18 cmH20-23 cmH20] 20 cmH20   Intake/Output Summary (Last 24 hours) at 12/28/2018 1210 Last data filed at 12/28/2018 1100 Gross per 24 hour  Intake 790.04 ml  Output 1500 ml  Net -709.96 ml   Filed Weights   12/26/18 0500 12/27/18 0500 12/28/18 0500  Weight: 92.3 kg 92.1 kg 92.4 kg    Examination:. General appearance: 63 y.o., male, NAD, conversant  Eyes: anicteric sclerae, moist conjunctivae; PERRLA, tracking appropriately HENT: NCAT; oropharynx, MMM Neck: Trachea midline; tracheostomy tube in place,  secretions around Lungs: Bilateral ventilated breath sounds CV: RRR, S1, S2, no MRGs  Abdomen: Soft, non-tender; non-distended, BS present, PEG tube in place Extremities: No peripheral edema, radial and DP pulses present bilaterally  Skin:  Normal temperature, turgor and texture; no rash Neuro: Alert and oriented to person.  Attempt to communicate with mouthing over vent.  Resolved Hospital Problem list   AKI Pneumonia Hypokalemia Hypernatremia  Assessment & Plan:  Acute respiratory failure with hypoxemia Prolonged respiratory failure after flu a infection, Corynebacterium pneumonia. Continue attempts at pressure support weaning trials. Continue trach care and pulmonary toileting Attempt to wean again as tolerated per respiratory therapy to trach collar trial. Care discussed with respiratory therapy team at bedside.  Acute systolic heart failure Follow-up echocardiogram with improvement of ejection fraction. At this point no plans for cardiac catheterization per cardiology Continue Coreg and Cozaar, Lipitor. Will need outpatient follow-up with cardiology upon discharge.  Nutrition status post PEG tube by IR Continue tube feeds at goal  Acute metabolic encephalopathy  Continue Seroquel at this time ECG checked with normal QTC  DM2 SSI with Levemir  Emesis Continue tube feeds at this time PRN antiemetics.  He has had a few episodes of vomiting.  Best practice:  Diet: tube feeding Pain/Anxiety/Delirium protocol (if indicated): as above, RASS goal 0 VAP protocol (if indicated): yes DVT prophylaxis: lovenox GI prophylaxis: famotidine Glucose control: SSI/Detemir Mobility: PT working with him Code Status: Full Family Communication: Mother and son updated 1/31. Disposition: Doesn't have LTAC benefits, weaning well this week so hoping to avoid vent-SNF  Labs   CBC: Recent Labs  Lab 12/22/18 0446 12/26/18 0512  WBC 10.1 7.0  HGB 9.3* 9.2*  HCT 30.1* 29.6*  MCV 91.5 86.5  PLT 355 676    Basic Metabolic Panel: Recent Labs  Lab 12/22/18 0446 12/23/18 0531 12/26/18 0512  NA 142 138 139  K 3.7 3.8 3.8  CL 108 104 105  CO2 _0 GLUCOSE 81 118* 116*  BUN 24* 22* 29*  CREATININE 0.79 0.86  0.78  CALCIUM 8.3* 8.2* 8.7*  MG 2.2  --  1.8  PHOS  --   --  4.5   GFR: Estimated Creatinine Clearance: 107.8 mL/min (by C-G formula based on SCr of 0.78 mg/dL). Recent Labs  Lab 12/22/18 0446 12/26/18 0512  WBC 10.1 7.0    Liver Function Tests: No results for input(s): AST, ALT, ALKPHOS, BILITOT, PROT, ALBUMIN in the last 168 hours. No results for input(s): LIPASE, AMYLASE in the last 168 hours. No results for input(s): AMMONIA in the last 168 hours.  ABG    Component Value Date/Time   PHART 7.282 (L) 12/07/2018 0349   PCO2ART 50.2 (H) 12/07/2018 0349   PO2ART 59.0 (L) 12/07/2018 0349   HCO3 23.4 12/07/2018 0349   TCO2 25 12/07/2018 0349   ACIDBASEDEF 3.0 (H) 12/07/2018 0349   O2SAT 84.0 12/07/2018 0349     Coagulation Profile: Recent Labs  Lab 12/23/18 0531  INR 1.26    Cardiac Enzymes: No results for input(s): CKTOTAL, CKMB, CKMBINDEX, TROPONINI in the last 168 hours.  HbA1C: Hgb A1c MFr Bld  Date/Time Value Ref Range Status  03/25/2017 05:59 AM 12.3 (H) 4.8 - 5.6 % Final    Comment:    (NOTE)         Pre-diabetes: 5.7 - 6.4         Diabetes: >6.4         Glycemic control for adults with diabetes: <7.0  12/28/2015 08:50 PM 11.7 (H) 4.8 - 5.6 % Final    Comment:    (NOTE)         Pre-diabetes: 5.7 - 6.4         Diabetes: >6.4         Glycemic control for adults with diabetes: <7.0     CBG: Recent Labs  Lab 12/27/18 1620 12/27/18 1933 12/28/18 0010 12/28/18 0433 12/28/18 0835  GLUCAP 254* 210* 164* 201* 142*    Garner Nash, DO [facility] 12/28/2018 12:10 PM  Personal pager: [phone] If unanswered, please page CCM On-call: [phone]

## 2018-12-28 NOTE — Progress Notes (Signed)
RT NOTE: RT holding chest  PT due to patient vomiting recently. RT will continue at next scheduled time. RT will continue to monitor.

## 2018-12-28 NOTE — Progress Notes (Signed)
eLink Physician-Brief Progress Note Patient Name: Antonio [name]. DOB: 02-04-1956 MRN: [id number]   Date of Service  12/28/2018  HPI/Events of Note  Agitation - Request to renew soft waist belt restraint.   eICU Interventions  Will renew soft waist belt restraint.      Intervention Category Minor Interventions: Agitation / anxiety - evaluation and management  Sommer,Steven Eugene 12/28/2018, 8:09 PM

## 2018-12-29 ENCOUNTER — Inpatient Hospital Stay (HOSPITAL_COMMUNITY): Payer: Medicaid Other

## 2018-12-29 LAB — GLUCOSE, CAPILLARY
GLUCOSE-CAPILLARY: 125 mg/dL — AB (ref 70–99)
Glucose-Capillary: 113 mg/dL — ABNORMAL HIGH (ref 70–99)
Glucose-Capillary: 168 mg/dL — ABNORMAL HIGH (ref 70–99)
Glucose-Capillary: 175 mg/dL — ABNORMAL HIGH (ref 70–99)
Glucose-Capillary: 179 mg/dL — ABNORMAL HIGH (ref 70–99)
Glucose-Capillary: 209 mg/dL — ABNORMAL HIGH (ref 70–99)

## 2018-12-29 MED ORDER — ACETYLCYSTEINE 20 % IN SOLN
4.0000 mL | RESPIRATORY_TRACT | Status: DC
  Administered 2018-12-29 – 2019-01-06 (×46): 4 mL via RESPIRATORY_TRACT
  Filled 2018-12-29 (×47): qty 4

## 2018-12-29 MED ORDER — ACETYLCYSTEINE 20 % IN SOLN: 4.0000 mL | RESPIRATORY_TRACT | Status: DC

## 2018-12-29 MED ORDER — IPRATROPIUM-ALBUTEROL 0.5-2.5 (3) MG/3ML IN SOLN
3.0000 mL | RESPIRATORY_TRACT | Status: DC
  Administered 2018-12-29 – 2019-01-06 (×49): 3 mL via RESPIRATORY_TRACT
  Filled 2018-12-29 (×50): qty 3

## 2018-12-29 NOTE — Progress Notes (Signed)
RT NOTE: Per Rutherford Guys, PA hold trach change due to secretions. RT will continue to monitor.

## 2018-12-29 NOTE — Progress Notes (Signed)
Physical Therapy Treatment Patient Details Name: Antonio [name]. MRN: [id number] DOB: 20-Feb-1956 Today's Date: 12/29/2018    History of Present Illness 63 yo admitted with flu A and PNA with acute respiratory failure intubated 1/4, trach 1/16 with course complicated by acute MI, CHF and cardiogenic shock and mucous plugging. PMhx: DM, HTN, HLD, CAD, Lt BKA, rt toe amputation    PT Comments    Pt restless upon PT arrival but was able to participate and follow commands in PT session. Pt completed 2 standing sessions in the stedy and was able to complete marching x 10 reps. Pt progressing well functionally. Pt remains intubated, anxious, and has decreased safety awareness. Acute PT to cont to follow.    Follow Up Recommendations  LTACH;Supervision/Assistance - 24 hour     Equipment Recommendations       Recommendations for Other Services       Precautions / Restrictions Precautions Precautions: Fall Precaution Comments: trach/vent, L BKA Required Braces or Orthoses: (pt with prosthesis) Restrictions Weight Bearing Restrictions: No    Mobility  Bed Mobility Overal bed mobility: Needs Assistance Bed Mobility: Rolling;Sidelying to Sit Rolling: Min assist Sidelying to sit: Min assist;+2 for safety/equipment       General bed mobility comments: max directional verbal cues, minA for trunk elevation and to push push up with UEs, minA for LE management back into bed as well  Transfers Overall transfer level: Needs assistance Equipment used: (stedy) Transfers: Sit to/from Stand Sit to Stand: Min assist;+2 physical assistance        Lateral/Scoot Transfers: Min assist;+2 physical assistance General transfer comment: pt completed to sit to stand trials with stedy and R LE prosthesis, pt tolerated well, pt stood x 5 min and then x 3 min with straight upright posture and contact guard to maintain standing  Ambulation/Gait             General Gait Details: pt able to  marching place x10 reps for both standing trials without knee buckling or LOB, second trial with decresaed foot clearance however pt still initiatied   Optometrist    Modified Rankin (Stroke Patients Only)       Balance Overall balance assessment: Needs assistance Sitting-balance support: No upper extremity supported;Feet supported Sitting balance-Leahy Scale: Fair Sitting balance - Comments: assisted pt with putting on L LE prosthesis   Standing balance support: Bilateral upper extremity supported Standing balance-Leahy Scale: Poor Standing balance comment: bil UE supported                            Cognition Arousal/Alertness: Awake/alert Behavior During Therapy: Anxious;Restless Overall Cognitive Status: Impaired/Different from baseline Area of Impairment: Safety/judgement;Problem solving;Attention                   Current Attention Level: Selective   Following Commands: Follows one step commands consistently;Follows multi-step commands with increased time Safety/Judgement: Decreased awareness of safety   Problem Solving: Slow processing;Decreased initiation;Difficulty sequencing;Requires verbal cues;Requires tactile cues General Comments: pt very anxious requiring max verbal cues to stay on task and calm      Exercises      General Comments General comments (skin integrity, edema, etc.): pt with noted sacral wound thay hydro therapy is working on and also with L lateral temporal pressure ulcer, pt covered with soil, dependent for hygiene      Pertinent  Vitals/Pain Pain Assessment: Faces Faces Pain Scale: Hurts a little bit Pain Location: sacrum with positioning Pain Descriptors / Indicators: Grimacing Pain Intervention(s): Monitored during session    Home Living                      Prior Function            PT Goals (current goals can now be found in the care plan section) Acute Rehab PT  Goals Patient Stated Goal: didn't state Progress towards PT goals: Progressing toward goals    Frequency    Min 3X/week      PT Plan Current plan remains appropriate    Co-evaluation              AM-PAC PT "6 Clicks" Mobility   Outcome Measure  Help needed turning from your back to your side while in a flat bed without using bedrails?: A Little Help needed moving from lying on your back to sitting on the side of a flat bed without using bedrails?: A Little Help needed moving to and from a bed to a chair (including a wheelchair)?: A Little Help needed standing up from a chair using your arms (e.g., wheelchair or bedside chair)?: A Lot Help needed to walk in hospital room?: Total Help needed climbing 3-5 steps with a railing? : Total 6 Click Score: 13    End of Session Equipment Utilized During Treatment: Gait belt Activity Tolerance: Patient tolerated treatment well Patient left: with call bell/phone within reach;in bed;with nursing/sitter in room Nurse Communication: Mobility status;Need for lift equipment PT Visit Diagnosis: Other abnormalities of gait and mobility (R26.89);Muscle weakness (generalized) (M62.81);Unsteadiness on feet (R26.81)     Time: 2458-0998 PT Time Calculation (min) (ACUTE ONLY): 41 min  Charges:  $Gait Training: 23-37 mins $Therapeutic Activity: 8-22 mins                     [name], PT, DPT Acute Rehabilitation Services Pager #: [phone] Office #: [phone]    [name] 12/29/2018, 10:51 AM
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
## 2018-12-29 NOTE — Progress Notes (Signed)
NAME:  Antonio Deriso., MRN:  [id number], DOB:  28-Mar-1956, LOS: 33 ADMISSION DATE:  11/28/2018, CONSULTATION DATE:  1/3 REFERRING MD:  EDP, CHIEF COMPLAINT:  Dyspnea   Brief History   63 year old male with influenza A causing respiratory failure and cardiogenic shock.  Prolonged mechanical ventilation, tracheostomy placed on January 16.  Past Medical History  DM2, CAD, HTN  Significant Hospital Events   1/6 self extubated 1/7 re-intubated 1/8 self extubated 1/9 acute respiratory failure, mucus plugging right lower lobe, re-intubated, bronchoscopy 1/10 New fever T Max 103.1, antibiotics resumed with ceftaz and vancomycin 1/11: Hemodynamically seems to be improving. Very hypernatremic. X-ray improving. Adding free water, one-time Lasix, follow-up chemistry a.m. Hoping to initiate weaning efforts on 1/12 1/16. Continuing diuresis and ongoing ventilatory support. Working on weaning but still has significant work of breathing. Plan has been to proceed with tracheostomy. Hopefully we can continue ongoing diuresis and continue to decrease sedation 1/20 still struggling to wean. Secretions and anxiety are barriers.  1/21 trach revision to 6 distal XLT, fever, increased secretions, abx broadened / bronch 1/22 ongoing thick secretions 1/24 Failed SBT, low grade fever, Per CXR persistent pleural effusions / basilar consolidation atelectasis vs pneumonia per bases 1/25>> Mucus plugged with slow recovery after chest PT and Wound Care 2/3 > first time weaned for greater than 15 minutes (previously failed due to agitation, secretions, tachypnea)  Consults:  PCCM Cardiology   Procedures:  ETT 1/3 >>1/6, 1/7 > 1/8> 1/9>> 1/16 1/5 Right TL PICC >> 1/9 Bronch>> Mucus plugging RLL Trach 1/16 (JY) >>1/20 trach change failed and #4 cuffed 1/21 trach upsized to 6 cuffed XLT distal  1/17 right nare cortrak >> 1/29 1/28 gastrostomy tube >   Significant Diagnostic Tests:  UDS 1/4  >>negative  ECHO 1/4 >>LVEF 20-25%, akinesis of the anteroseptal, anterolateral & apical myocardium, LVEF 52-84%, grade 1 diastolic dysfunction, small pericardial effusion without evidence of hemodynamic compromise. CT Chest / ABD / Pelvis 1/3 >> airway thickening, motion artifact, CAD Echo 1/29>> LVEF 60-65%, left ventricular hypertrophy, trivial pericardial effusion  Micro Data:  RVP 1/3 >> positive for influenza A BCx2 1/3 >>No growth UC 1/4 >>No Growth Sputum Cx 1/7 >> Normal respiratory flora BAL >>corynebacterium  Blood Cx 1/10 > neg Sputum 1/21 > abundant corynebacterium striatum  Antimicrobials:  Vanco 1/3 x1;    Zosyn 1/30 x1  Tamiflu 1/3 >1/8 1/10 vancomycin>1/12 1/10 ceftaz >1/12;   1/21 >>1/22 1/12 emycin > 1/22 1/23 cefepime >> 1/24 1/21 Vanc> 1/28  Interim history/subjective:  Agitated overnights, required restraints again. Having moderate secretions from trach this AM.  Objective   Blood pressure (!) 122/102, pulse 94, temperature 99.1 F (37.3 C), temperature source Oral, resp. rate 20, height 6' (1.829 m), weight 89.7 kg, SpO2 98 %.    Vent Mode: CPAP;PSV FiO2 (%):  [40 %-49 %] 40 % Set Rate:  [22 bmp] 22 bmp Vt Set:  [620 mL] 620 mL PEEP:  [5 cmH20] 5 cmH20 Pressure Support:  [15 cmH20] 15 cmH20 Plateau Pressure:  [18 cmH20-28 cmH20] 24 cmH20   Intake/Output Summary (Last 24 hours) at 12/29/2018 0955 Last data filed at 12/29/2018 0800 Gross per 24 hour  Intake 2434.93 ml  Output 112 ml  Net 2322.93 ml   Filed Weights   12/27/18 0500 12/28/18 0500 12/29/18 0500  Weight: 92.1 kg 92.4 kg 89.7 kg    Examination: General: Middle aged male, resting in bed, in NAD. Neuro: Awake, follows some commands.  Mouths words.  HEENT: [place]/AT. Sclerae anicteric.  Trach in place with moderate secretions noted. Cardiovascular: RRR, no M/R/G.  Lungs: Respirations even and unlabored.  Coarse bilaterally. Abdomen: BS x 4, soft, NT/ND.  Musculoskeletal: No gross  deformities, no edema.  Skin: Intact, warm, no rashes.   Resolved Hospital Problem list   AKI Pneumonia Hypokalemia Hypernatremia  Assessment & Plan:   Acute respiratory failure with hypoxemia Prolonged respiratory failure after flu a infection, Corynebacterium pneumonia. Continue PSV as able (in the past has failed due to agitation, secretions, tachypnea). Continue trach care and pulmonary toileting. Add mucomyst nebs and  Continue chest PT (change from TID to q4hrs). Assess CXR. Consider trach change this week (will await secretions to improve some).  Acute systolic heart failure - Follow-up echocardiogram with improvement of ejection fraction. At this point no plans for cardiac catheterization per cardiology Continue Coreg and Cozaar, Lipitor. Will need outpatient follow-up with cardiology upon discharge.  Nutrition. status post PEG tube by IR. Continue tube feeds at goal.  Acute metabolic encephalopathy. Continue Seroquel, clonazepam.  DM2. SSI with Levemir.  Emesis. Continue tube feeds at this time. PRN antiemetics.  He has had a few episodes of vomiting.  Generalized deconditioning. Continue PT efforts.  Best practice:  Diet: tube feeding Pain/Anxiety/Delirium protocol (if indicated): as above, RASS goal 0 VAP protocol (if indicated): yes DVT prophylaxis: lovenox GI prophylaxis: famotidine Glucose control: SSI/Levemir Mobility: Continue PT efforts Code Status: Full Family Communication: Mother and son updated 1/31.  None available 2/3. Disposition: Doesn't have LTAC benefits, continue weaning efforts in hopes we can get him off ventilatory support and transition to ATC.  CC time 30 min.   Montey Hora, [facility] Pager: [phone].  If no answer, [phone] 12/29/2018, 10:15 AM

## 2018-12-29 NOTE — Progress Notes (Signed)
Physical Therapy Wound Treatment Patient Details  Name: Antonio [name]. MRN: [id number] Date of Birth: July 06, 1956  Today's Date: 12/29/2018 Time: 1002-1039 Time Calculation (min): 37 min  Subjective  Patient and Family Stated Goals: none stated Date of Onset: (unknown) Prior Treatments: dressing changes  Pain Score:  No indication of pain with hydrotherapy  Wound Assessment  Pressure Injury 12/01/18 Unstageable - Full thickness tissue loss in which the base of the ulcer is covered by slough (yellow, tan, gray, green or brown) and/or eschar (tan, brown or black) in the wound bed. sacrum (Active)  Dressing Type Gauze (Comment);Moist to dry;ABD;Other (Comment);Barrier Film (skin prep) 12/29/2018 10:00 AM  Dressing Clean;Dry;Intact;Changed 12/29/2018 10:00 AM  Dressing Change Frequency Daily 12/29/2018 10:00 AM  State of Healing Non-healing 12/29/2018 10:00 AM  Site / Wound Assessment Red;Yellow;Pink 12/29/2018 10:00 AM  % Wound base Red or Granulating 40% 12/29/2018 10:00 AM  % Wound base Yellow/Fibrinous Exudate 60% 12/29/2018 10:00 AM  % Wound base Black/Eschar 0% 12/29/2018 10:00 AM  % Wound base Other/Granulation Tissue (Comment) 0% 12/29/2018 10:00 AM  Peri-wound Assessment Excoriated;Erythema (blanchable) 12/29/2018 10:00 AM  Wound Length (cm) 8 cm 12/25/2018 10:07 AM  Wound Width (cm) 5 cm 12/25/2018 10:07 AM  Wound Depth (cm) 0.1 cm 12/25/2018 10:07 AM  Wound Surface Area (cm^2) 40 cm^2 12/25/2018 10:07 AM  Wound Volume (cm^3) 4 cm^3 12/25/2018 10:07 AM  Tunneling (cm) 2.5cm at 6:oo on clock 12/11/2018  8:00 PM  Margins Unattached edges (unapproximated) 12/29/2018 10:00 AM  Drainage Amount Minimal 12/29/2018 10:00 AM  Drainage Description Serosanguineous 12/29/2018 10:00 AM  Treatment Debridement (Selective);Hydrotherapy (Pulse lavage);Other (Comment) 12/29/2018 10:00 AM   Dakins soaked guaze   Hydrotherapy Pulsed lavage therapy - wound location: sacrum Pulsed Lavage with Suction (psi): 8  psi(8-12) Pulsed Lavage with Suction - Normal Saline Used: 1000 mL Pulsed Lavage Tip: Tip with splash shield Selective Debridement Selective Debridement - Location: sacrum Selective Debridement - Tools Used: Forceps;Scalpel Selective Debridement - Tissue Removed: yellow necrotic tissue   Wound Assessment and Plan  Wound Therapy - Assess/Plan/Recommendations Wound Therapy - Clinical Statement: Continued decrease of necrotic tissue in wound bed. Many small areas of pink tissue coming through. No blue green drainage noted. Continue with hydrotherapy. Likely close to decreasing to 3x/wk. Wound Therapy - Functional Problem List: decr mobility Factors Delaying/Impairing Wound Healing: Diabetes Mellitus;Immobility;Multiple medical problems Hydrotherapy Plan: Debridement;Dressing change;Patient/family education;Pulsatile lavage with suction Wound Therapy - Frequency: 6X / week Wound Therapy - Follow Up Recommendations: Skilled nursing facility Wound Plan: see above  Wound Therapy Goals- Improve the function of patient's integumentary system by progressing the wound(s) through the phases of wound healing (inflammation - proliferation - remodeling) by: Decrease Necrotic Tissue to: 50 Decrease Necrotic Tissue - Progress: Updated due to goal met Increase Granulation Tissue to: 50 Increase Granulation Tissue - Progress: Updated due to goal met Goals/treatment plan/discharge plan were made with and agreed upon by patient/family: Yes Time For Goal Achievement: 7 days Wound Therapy - Potential for Goals: Fair  Goals will be updated until maximal potential achieved or discharge criteria met.  Discharge criteria: when goals achieved, discharge from hospital, MD decision/surgical intervention, no progress towards goals, refusal/missing three consecutive treatments without notification or medical reason.  [place] 12/29/2018, 11:43 AM [place] Pager  [phone] Office [phone]

## 2018-12-29 NOTE — Progress Notes (Signed)
Patient removing monitoring equipment, and has been all shift. Patient has been reminded to please leave monitoring equipment in place. Will continue to monitor.

## 2018-12-30 LAB — GLUCOSE, CAPILLARY
Glucose-Capillary: 191 mg/dL — ABNORMAL HIGH (ref 70–99)
Glucose-Capillary: 231 mg/dL — ABNORMAL HIGH (ref 70–99)
Glucose-Capillary: 199 mg/dL — ABNORMAL HIGH (ref 70–99)
Glucose-Capillary: 191 mg/dL — ABNORMAL HIGH (ref 70–99)
Glucose-Capillary: 175 mg/dL — ABNORMAL HIGH (ref 70–99)

## 2018-12-30 LAB — BASIC METABOLIC PANEL
Anion gap: 10 (ref 5–15)
BUN: 32 mg/dL — ABNORMAL HIGH (ref 6–20)
CALCIUM: 8.6 mg/dL — AB (ref 8.9–10.3)
CO2: 25 mmol/L (ref 22–32)
Chloride: 101 mmol/L (ref 98–111)
Creatinine, Ser: 0.73 mg/dL (ref 0.61–1.24)
GFR calc non Af Amer: >60 mL/min (ref >60)
Glucose, Bld: 231 mg/dL — ABNORMAL HIGH (ref 70–99)
Potassium: 4 mmol/L (ref 3.5–5.1)
Sodium: 136 mmol/L (ref 135–145)

## 2018-12-30 LAB — CBC
HCT: 28.6 % — ABNORMAL LOW (ref 39.0–52.0)
Hemoglobin: 9 g/dL — ABNORMAL LOW (ref 13.0–17.0)
MCH: 27.4 pg (ref 26.0–34.0)
MCHC: 31.5 g/dL (ref 30.0–36.0)
MCV: 86.9 fL (ref 80.0–100.0)
Platelets: 280 10*3/uL (ref 150–400)
RBC: 3.29 MIL/uL — ABNORMAL LOW (ref 4.22–5.81)
RDW: 14.1 % (ref 11.5–15.5)
WBC: 7.8 10*3/uL (ref 4.0–10.5)
nRBC: 0 % (ref 0.0–0.2)

## 2018-12-30 LAB — PHOSPHORUS: Phosphorus: 4.4 mg/dL (ref 2.5–4.6)

## 2018-12-30 LAB — MAGNESIUM: MAGNESIUM: 1.9 mg/dL (ref 1.7–2.4)

## 2018-12-30 NOTE — Progress Notes (Signed)
NAME:  Antonio [name]., MRN:  [id number], DOB:  [birth year], LOS: 84 ADMISSION DATE:  11/28/2018, CONSULTATION DATE:  1/3 REFERRING MD:  EDP, CHIEF COMPLAINT:  Dyspnea   Brief History   63 year old male with influenza A causing respiratory failure and cardiogenic shock.  Prolonged mechanical ventilation, tracheostomy placed on January 16.  Past Medical History  DM2, CAD, HTN  Significant Hospital Events   1/6 self extubated 1/7 re-intubated 1/8 self extubated 1/9 acute respiratory failure, mucus plugging right lower lobe, re-intubated, bronchoscopy 1/10 New fever T Max 103.1, antibiotics resumed with ceftaz and vancomycin 1/11: Hemodynamically seems to be improving. Very hypernatremic. X-ray improving. Adding free water, one-time Lasix, follow-up chemistry a.m. Hoping to initiate weaning efforts on 1/12 1/16. Continuing diuresis and ongoing ventilatory support. Working on weaning but still has significant work of breathing. Plan has been to proceed with tracheostomy. Hopefully we can continue ongoing diuresis and continue to decrease sedation 1/20 still struggling to wean. Secretions and anxiety are barriers.  1/21 trach revision to 6 distal XLT, fever, increased secretions, abx broadened / bronch 1/22 ongoing thick secretions 1/24 Failed SBT, low grade fever, Per CXR persistent pleural effusions / basilar consolidation atelectasis vs pneumonia per bases 1/25>> Mucus plugged with slow recovery after chest PT and Wound Care 2/3 > first time weaned for greater than 15 minutes (previously failed due to agitation, secretions, tachypnea)  Consults:  PCCM Cardiology   Procedures:  ETT 1/3 >>1/6, 1/7 > 1/8> 1/9>> 1/16 1/5 Right TL PICC >> 2/4 1/9 Bronch>> Mucus plugging RLL Trach 1/16 (JY) >>1/20 trach change failed and #4 cuffed 1/21 trach upsized to 6 cuffed XLT distal  1/17 right nare cortrak >> 1/29 1/28 gastrostomy tube >   Significant Diagnostic Tests:  UDS  1/4 >>negative  ECHO 1/4 >>LVEF 20-25%, akinesis of the anteroseptal, anterolateral & apical myocardium, LVEF 08-67%, grade 1 diastolic dysfunction, small pericardial effusion without evidence of hemodynamic compromise. CT Chest / ABD / Pelvis 1/3 >> airway thickening, motion artifact, CAD Echo 1/29>> LVEF 60-65%, left ventricular hypertrophy, trivial pericardial effusion  Micro Data:  RVP 1/3 >> positive for influenza A BCx2 1/3 >>No growth UC 1/4 >>No Growth Sputum Cx 1/7 >> Normal respiratory flora BAL >>corynebacterium  Blood Cx 1/10 > neg Sputum 1/21 > abundant corynebacterium striatum  Antimicrobials:  Vanco 1/3 x1;    Zosyn 1/30 x1  Tamiflu 1/3 >1/8 1/10 vancomycin>1/12 1/10 ceftaz >1/12;   1/21 >>1/22 1/12 emycin > 1/22 1/23 cefepime >> 1/24 1/21 Vanc> 1/28  Interim history/subjective:  No acute events.  Continues to have tracheal secretions; though, have thinned out some compared to yesterday.  Objective   Blood pressure (!) 153/72, pulse 98, temperature 99.1 F (37.3 C), temperature source Axillary, resp. rate (!) 22, height 6' (1.829 m), weight 92 kg, SpO2 95 %.    Vent Mode: CPAP;PSV FiO2 (%):  [40 %] 40 % Set Rate:  [22 bmp] 22 bmp Vt Set:  [620 mL] 620 mL PEEP:  [5 cmH20] 5 cmH20 Pressure Support:  [15 cmH20] 15 cmH20 Plateau Pressure:  [17 cmH20-29 cmH20] 17 cmH20   Intake/Output Summary (Last 24 hours) at 12/30/2018 0758 Last data filed at 12/30/2018 0600 Gross per 24 hour  Intake 8692.62 ml  Output 1600 ml  Net 7092.62 ml   Filed Weights   12/28/18 0500 12/29/18 0500 12/30/18 0500  Weight: 92.4 kg 89.7 kg 92 kg    Examination: General: Middle aged male, resting in bed, in NAD. Neuro: Awake,  follows some commands.  Mouths words. HEENT: [place]/AT. Sclerae anicteric.  Trach in place with secretions noted.  Cardiovascular: RRR, no M/R/G.  Lungs: Respirations even and unlabored.  Coarse bilaterally. Abdomen: BS x 4, soft, NT/ND.  Musculoskeletal: L  BKA, no edema.  Skin: Intact, warm, no rashes.   Resolved Hospital Problem list   AKI Pneumonia Hypokalemia Hypernatremia  Assessment & Plan:   Acute respiratory failure with hypoxemia Prolonged respiratory failure after flu a infection, Corynebacterium pneumonia. Continue PSV as able (in the past has failed due to agitation, secretions, tachypnea). Continue trach care and pulmonary toileting. Add mucomyst nebs and  Continue chest PT. Consider trach change later this week (will await secretions to improve some). CXR intermittently.  Acute systolic heart failure - Follow-up echocardiogram with improvement of ejection fraction. At this point no plans for cardiac catheterization per cardiology Continue Coreg and Cozaar, Lipitor. Will need outpatient follow-up with cardiology upon discharge.  Nutrition. status post PEG tube by IR. Continue tube feeds at goal.  Acute metabolic encephalopathy. Continue Seroquel, clonazepam.  DM2. SSI with Levemir.  Emesis - appears to have resolved. Continue tube feeds at this time. PRN antiemetics.  Generalized deconditioning. Continue PT efforts, needs LTACH.  Best practice:  Diet: tube feeding Pain/Anxiety/Delirium protocol (if indicated): as above, RASS goal 0 VAP protocol (if indicated): yes DVT prophylaxis: lovenox GI prophylaxis: famotidine Glucose control: SSI/Levemir Mobility: Continue PT efforts Code Status: Full Family Communication: Mother and son updated 1/31.  None available 2/3, 2/4. Disposition: Doesn't have LTAC benefits, continue weaning efforts in hopes we can get him off ventilatory support and transition to ATC.  CC time 30 min.   Montey Hora, [place] [facility] Pager: [phone].  If no answer, [phone] 12/30/2018, 7:58 AM

## 2018-12-30 NOTE — Progress Notes (Signed)
Physical Therapy Wound Treatment Patient Details  Name: Antonio [name]. MRN: [id number] Date of Birth: 10-14-1956  Today's Date: 12/30/2018 Time: 7939-0300 Time Calculation (min): 35 min  Subjective  Subjective: Pt grimacing with removal of dressing Patient and Family Stated Goals: Pt on vent Date of Onset: (unknown) Prior Treatments: dressing changes  Pain Score:  Pain with removal of dressing.  Wound Assessment  Pressure Injury 12/01/18 Unstageable - Full thickness tissue loss in which the base of the ulcer is covered by slough (yellow, tan, gray, green or brown) and/or eschar (tan, brown or black) in the wound bed. sacrum (Active)  Dressing Type Gauze (Comment);Moist to dry;ABD;Other (Comment);Barrier Film (skin prep) 12/30/2018  9:20 AM  Dressing Clean;Dry;Intact;Changed 12/30/2018  9:20 AM  Dressing Change Frequency Daily 12/30/2018  9:20 AM  State of Healing Early/partial granulation 12/30/2018  9:20 AM  Site / Wound Assessment Red;Yellow;Pink 12/30/2018  9:20 AM  % Wound base Red or Granulating 45% 12/30/2018  9:20 AM  % Wound base Yellow/Fibrinous Exudate 55% 12/30/2018  9:20 AM  % Wound base Black/Eschar 0% 12/30/2018  9:20 AM  % Wound base Other/Granulation Tissue (Comment) 0% 12/30/2018  9:20 AM  Peri-wound Assessment Erythema (blanchable) 12/30/2018  9:20 AM  Wound Length (cm) 8 cm 12/25/2018 10:07 AM  Wound Width (cm) 5 cm 12/25/2018 10:07 AM  Wound Depth (cm) 0.1 cm 12/25/2018 10:07 AM  Wound Surface Area (cm^2) 40 cm^2 12/25/2018 10:07 AM  Wound Volume (cm^3) 4 cm^3 12/25/2018 10:07 AM  Tunneling (cm) 2.5cm at 6:oo on clock 12/11/2018  8:00 PM  Margins Unattached edges (unapproximated) 12/30/2018  9:20 AM  Drainage Amount Moderate 12/30/2018  9:20 AM  Drainage Description Green 12/30/2018  9:20 AM  Treatment Debridement (Selective);Hydrotherapy (Pulse lavage);Other (Comment) 12/30/2018  9:20 AM   Dakins soaked guaze for dressing   Hydrotherapy Pulsed lavage therapy - wound location:  sacrum Pulsed Lavage with Suction (psi): 8 psi Pulsed Lavage with Suction - Normal Saline Used: 1000 mL Pulsed Lavage Tip: Tip with splash shield Selective Debridement Selective Debridement - Location: sacrum Selective Debridement - Tools Used: Forceps;Scissors Selective Debridement - Tissue Removed: yellow necrotic tissue   Wound Assessment and Plan  Wound Therapy - Assess/Plan/Recommendations Wound Therapy - Clinical Statement: Wound continues to improve. Did note blue green drainage today. Recommend switch to aquacel AG and to decr hydrotherapy to 3x/wk starting tomorrow. Likely can dc hydro early to mid week next week. Wound Therapy - Functional Problem List: decr mobility Factors Delaying/Impairing Wound Healing: Diabetes Mellitus;Immobility;Multiple medical problems Hydrotherapy Plan: Debridement;Dressing change;Patient/family education;Pulsatile lavage with suction Wound Therapy - Frequency: 6X / week Wound Therapy - Follow Up Recommendations: Skilled nursing facility Wound Plan: see above  Wound Therapy Goals- Improve the function of patient's integumentary system by progressing the wound(s) through the phases of wound healing (inflammation - proliferation - remodeling) by: Decrease Necrotic Tissue to: 50 Decrease Necrotic Tissue - Progress: Progressing toward goal Increase Granulation Tissue to: 50 Increase Granulation Tissue - Progress: Progressing toward goal  Goals will be updated until maximal potential achieved or discharge criteria met.  Discharge criteria: when goals achieved, discharge from hospital, MD decision/surgical intervention, no progress towards goals, refusal/missing three consecutive treatments without notification or medical reason.  GP     [place] 12/30/2018, 10:04 AM [place] Pager [phone] Office [phone]

## 2018-12-30 NOTE — Consult Note (Signed)
WOC met with PT to discuss status of sacral wound, see PT notes. Drainage has continued to fluctuate in color/appearance. Current status of the wound; PT feels frequency can be decreased to 3xwk and possibly to switch to silver hydrofiber to see if the antimicrobial properties would improve the wound bed status.  Orders updated.   [facility] Nurse team will follow with you and see patient within 10 days for wound assessments.  Please notify [facility] nurses of any acute changes in the wounds or any new areas of concern [place] MSN, [place], [place], [place]

## 2018-12-31 LAB — GLUCOSE, CAPILLARY
Glucose-Capillary: 150 mg/dL — ABNORMAL HIGH (ref 70–99)
Glucose-Capillary: 156 mg/dL — ABNORMAL HIGH (ref 70–99)
Glucose-Capillary: 158 mg/dL — ABNORMAL HIGH (ref 70–99)
Glucose-Capillary: 165 mg/dL — ABNORMAL HIGH (ref 70–99)
Glucose-Capillary: 167 mg/dL — ABNORMAL HIGH (ref 70–99)
Glucose-Capillary: 178 mg/dL — ABNORMAL HIGH (ref 70–99)
Glucose-Capillary: 184 mg/dL — ABNORMAL HIGH (ref 70–99)

## 2018-12-31 NOTE — Consult Note (Addendum)
WOC Nurse wound follow up Wound type: initially it is noted this patient had a wound in this same site that was I&D then followed up on 12/03/18 to have progresses  Measurement: see PT notes from hydrotherapy Wound bed: 50% yellow/50% pink Drainage (amount, consistency, odor) greenish, no odor Periwound: intact  Dressing procedure/placement/frequency: PT to continue hydrotherapy; just switched to silver hydrofiber to address exudate and possible bioburdan associated with drainage color.  Joneen Roach NP CCM in the room for assessment  WOC Nurse team will follow with you and see patient within 10 days for wound assessments.  Please notify WOC nurses of any acute changes in the wounds or any new areas of concern Usman Millett [facility] MSN, RN,CWOCN, CNS, CWON-AP [phone]

## 2018-12-31 NOTE — Progress Notes (Signed)
  Speech Language Pathology Treatment: Antonio [name] Speaking valve  Patient Details Name: Antonio [name]. MRN: [id number] DOB: [birth year] Today's Date: 12/31/2018 Time: 0350-0938 SLP Time Calculation (min) (ACUTE ONLY): 18 min  Assessment / Plan / Recommendation Clinical Impression  Pt seen in concert with RT who appropriately deflated cuff slowly over approximately 3 minutes. Audible secretions present, RT deep suctioned but did not remove significant secretions. RT decreased PEEP from 5 to 0. PMV with adapter would not fit on end of vent tubing. RT used Omniflex successfully and valve in place for approximately 15 minutes without pt able to vocalize despite multiple attempts with SLP cues. Tidal volume turned up adequately per RT and sensitivity was set to appropriate level as well. He received pain meds prior to hydrotherapy today and suspect this affected ability to produce adequate airflow. MD would like to try hydrotherapy in the afternoon if able to determine if facilitates weaning process. PMV attempts with ST and RT together only. Will continue intervention.    HPI HPI: 63 year old male coming in with fever cough chest pain and shortness of breath was found to have influenza A. Intubated 1/3-1/8. PMH: diabetes mellitus, hypertension. CXR 1/7 Low volume chest with interstitial coarsening and streaky basilar density. No effusion or pneumothorax.      SLP Plan  Continue with current plan of care       Recommendations         Patient may use Passy-Muir Speech Valve: with SLP only PMSV Supervision: Full         Oral Care Recommendations: Oral care QID Follow up Recommendations: 24 hour supervision/assistance SLP Visit Diagnosis: Aphonia (R49.1) Plan: Continue with current plan of care                       Antonio [name] 12/31/2018, 3:03 PM   Antonio [name] Antonio [name].Ed Nurse, children's [phone] Office [phone]

## 2018-12-31 NOTE — Progress Notes (Signed)
Nutrition Follow-up  DOCUMENTATION CODES:   Not applicable  INTERVENTION:   Continue:  Vital 1.5 @ 55 ml/hr (1320 ml) via G-tube 60 ml Prostat BID Free water flushes 300 ml Q6 hours per CCM  Provides: 2380 kcal, 149 grams protein, and 1003 ml free water.  Total free water: 2203 ml   Continue 1 package of Juven BID via PEG  NUTRITION DIAGNOSIS:   Inadequate oral intake related to inability to eat as evidenced by NPO status.  Ongoing  GOAL:   Provide needs based on ASPEN/SCCM guidelines  Met   MONITOR:   Vent status, Labs, Skin, Weight trends, I & O's  REASON FOR ASSESSMENT:   Consult (Review TF for fluid )  ASSESSMENT:   63 y/o M who [facility] 1[facility]3 with fever, cough, chest pain & SOB. Pt found to be Influenza A positive. Developed progressive respiratory distress requiring intubation in the ER.     1/15 Cortrak placed (tip post-pyloric) 1/16 trach 1/28- Cortrak removed, g-tube placed by IR  Pt requiring vent support. Continues with SBT.  Had hydrotherapy session this am.  Per RN, pt tolerating TF well. Will continue with current regimen.   Weight noted to decrease from 98 kg on 1/29 to 93.3 kg today.   Patient currently requiring ventilator support MV: 13.3 L/min Temp (24hrs), Avg:99.6 F (37.6 C), Min:98.3 F (36.8 C), Max:100.7 F (38.2 C) BP: 130/69 MAP: 89  I/O: + 10839 ml since 1/22 UOP: 750 ml x 24 hrs   Medications reviewed and include: folic acid, SSI novolog, Levemir, liquid MVI, thiamine Labs reviewed: CBG [id number]  Diet Order:   Diet Order            Diet NPO time specified  Diet effective midnight              EDUCATION NEEDS:   Not appropriate for education at this time  Skin:  Skin Assessment: Skin Integrity Issues: Skin Integrity Issues:: Stage II, Unstageable, Stage I Stage I: head Stage II: sacrum now unstageable  Unstageable: sacrum   Last BM:  2/4- liquid  Height:   Ht Readings from Last 1 Encounters:   11/29/18 6' (1.829 m)    Weight:   Wt Readings from Last 1 Encounters:  12/31/18 93.3 kg    Ideal Body Weight:  76.1 kg(adjusted for L BKA)  BMI:  Body mass index is 27.9 kg/m.  Estimated Nutritional Needs:   Kcal:  2385  Protein:  120-150 grams  Fluid:  > 2 L/day   Mariana Single RD, LDN Clinical Nutrition Pager # - [phone]

## 2018-12-31 NOTE — Progress Notes (Signed)
NAME:  Antonio Streett., MRN:  [id number], DOB:  [birth year], LOS: 7 ADMISSION DATE:  11/28/2018, CONSULTATION DATE:  1/3 REFERRING MD:  EDP, CHIEF COMPLAINT:  Dyspnea   Brief History   63 year old male with influenza A causing respiratory failure and cardiogenic shock. Prolonged mechanical ventilation, tracheostomy placed on January 16.  Past Medical History  DM2, CAD, HTN  Significant Hospital Events   1/6 self extubated 1/7 re-intubated 1/8 self extubated 1/9 acute respiratory failure, mucus plugging right lower lobe, re-intubated, bronchoscopy 1/10 New fever T Max 103.1, antibiotics resumed with ceftaz and vancomycin 1/11: Hemodynamically seems to be improving. Very hypernatremic. X-ray improving. Adding free water, one-time Lasix, follow-up chemistry a.m. Hoping to initiate weaning efforts on 1/12 1/16. Continuing diuresis and ongoing ventilatory support. Working on weaning but still has significant work of breathing. Plan has been to proceed with tracheostomy. Hopefully we can continue ongoing diuresis and continue to decrease sedation 1/20 still struggling to wean. Secretions and anxiety are barriers.  1/21 trach revision to 6 distal XLT, fever, increased secretions, abx broadened / bronch 1/22 ongoing thick secretions 1/24 Failed SBT, low grade fever, Per CXR persistent pleural effusions / basilar consolidation atelectasis vs pneumonia per bases 1/25>> Mucus plugged with slow recovery after chest PT and Wound Care 2/3 > first time weaned for greater than 15 minutes (previously failed due to agitation, secretions, tachypnea)  Consults:  PCCM Cardiology   Procedures:  ETT 1/3 >>1/6, 1/7 > 1/8> 1/9>> 1/16 1/5 Right TL PICC >> 2/4 1/9 Bronch>> Mucus plugging RLL Trach 1/16 (JY) >>1/20 trach change failed and #4 cuffed 1/21 trach upsized to 6 cuffed XLT distal  1/17 right nare cortrak >> 1/29 1/28 gastrostomy tube >   Significant Diagnostic Tests:  UDS  1/4 >>negative  ECHO 1/4 >>LVEF 20-25%, akinesis of the anteroseptal, anterolateral & apical myocardium, LVEF 28-41%, grade 1 diastolic dysfunction, small pericardial effusion without evidence of hemodynamic compromise. CT Chest / ABD / Pelvis 1/3 >> airway thickening, motion artifact, CAD Echo 1/29>> LVEF 60-65%, left ventricular hypertrophy, trivial pericardial effusion  Micro Data:  RVP 1/3 >> positive for influenza A BCx2 1/3 >>No growth UC 1/4 >>No Growth Sputum Cx 1/7 >> Normal respiratory flora BAL >>corynebacterium  Blood Cx 1/10 > neg Sputum 1/21 > abundant corynebacterium striatum  Antimicrobials:  Vanco 1/3 x1;    Zosyn 1/30 x1  Tamiflu 1/3 >1/8 1/10 vancomycin>1/12 1/10 ceftaz >1/12;   1/21 >>1/22 1/12 emycin > 1/22 1/23 cefepime >> 1/24 1/21 Vanc> 1/28  Interim history/subjective:  No acute events. Secretions seems to be improved per RN. Weaned several hours yesterday on 15/5 PSV. Wound dressing change this morning then planning to wean.   Objective   Blood pressure 129/67, pulse 87, temperature 98.7 F (37.1 C), temperature source Oral, resp. rate (!) 22, height 6' (1.829 m), weight 93.3 kg, SpO2 99 %.    Vent Mode: PRVC FiO2 (%):  [40 %] 40 % Set Rate:  [22 bmp] 22 bmp Vt Set:  [620 mL] 620 mL PEEP:  [5 cmH20] 5 cmH20 Pressure Support:  [15 cmH20] 15 cmH20 Plateau Pressure:  [11 cmH20-23 cmH20] 19 cmH20   Intake/Output Summary (Last 24 hours) at 12/31/2018 0936 Last data filed at 12/31/2018 0900 Gross per 24 hour  Intake 2575 ml  Output 750 ml  Net 1825 ml   Filed Weights   12/29/18 0500 12/30/18 0500 12/31/18 0418  Weight: 89.7 kg 92 kg 93.3 kg    Examination: General: Middle aged  male, resting in bed, in NAD. Neuro: Awake, following commands, communicating with staff.  HEENT: [place]/AT. Sclerae anicteric. Trach in place.  Cardiovascular: RRR, no M/R/G. No edema.  Lungs: Respirations even and unlabored.  Coarse bilaterally. Abdomen: BS x 4, soft,  NT/ND.  Musculoskeletal: L BKA. Moving all extremities.  Skin:Grossly intact.   Resolved Hospital Problem list   AKI Pneumonia Hypokalemia Hypernatremia  Assessment & Plan:   Acute respiratory failure with hypoxemia Prolonged respiratory failure after flu a infection, Corynebacterium pneumonia. - Continue PSV as able (in the past has failed due to agitation, secretions, tachypnea). - Will plan to wean 10/5 today after dressing change.  - Continue trach care and pulmonary toileting. - Mucomyst nebs x 1 more day and continue chest PT. - Consider trach change later this week (will await secretions to improve some).  Acute systolic heart failure - Follow-up echocardiogram with improvement of ejection fraction.At this point no plans for cardiac catheterization per cardiology - Continue Coreg and Cozaar, Lipitor. - Will need outpatient follow-up with cardiology upon discharge.  Nutrition: status post PEG tube by IR. - Continue tube feeds at goal.  Acute metabolic encephalopathy. - Continue Seroquel, clonazepam.  DM2. - SSI with Levemir.  Deconditioning - Continue PT efforts, likely will require LTACH.  Best practice:  Diet: tube feeding Pain/Anxiety/Delirium protocol (if indicated): as above, RASS goal 0 VAP protocol (if indicated): yes DVT prophylaxis: lovenox GI prophylaxis: famotidine Glucose control: SSI/Levemir Mobility: Continue PT efforts Code Status: Full Family Communication: No family at bedside today. Disposition: Doesn't have LTAC benefits, continue weaning efforts in hopes we can get him off ventilatory support and transition to ATC.   Georgann Housekeeper, AGACNP-BC [place] Pager [phone] or [phone]  12/31/2018 9:46 AM

## 2018-12-31 NOTE — Progress Notes (Signed)
Occupational Therapy Treatment Patient Details Name: Antonio [name]. MRN: [id number] DOB: 25-Dec-1955 Today's Date: 12/31/2018    History of present illness 63 yo admitted with flu A and PNA with acute respiratory failure intubated 1/4, trach 1/16 with course complicated by acute MI, CHF and cardiogenic shock and mucous plugging. PMhx: DM, HTN, HLD, CAD, Lt BKA, rt toe amputation   OT comments  Patient seated in recliner and requires encouragement to engage in self care tasks.  Limited ROM of B shoulders, but able to reach 90 degrees flexion with AAROM and cueing.  Patient able to engage in grooming tasks, seated with setup assist, but declines forward reaching to retrieve items from table and requires increased time to process and engage in tasks today.  Will follow.    Follow Up Recommendations  LTACH    Equipment Recommendations  Other (comment)(TBD at next venue of care)    Recommendations for Other Services      Precautions / Restrictions Precautions Precautions: Fall Precaution Comments: trach/vent, L BKA Restrictions Weight Bearing Restrictions: No       Mobility Bed Mobility Overal bed mobility: Needs Assistance Bed Mobility: Rolling;Sidelying to Sit Rolling: Mod assist;Max assist Sidelying to sit: Mod assist;Max assist;+2 for physical assistance       General bed mobility comments: OOB in recliner   Transfers Overall transfer level: Needs assistance   Transfers: Sit to/from Stand;Stand Pivot Transfers Sit to Stand: Mod assist;+2 physical assistance Stand pivot transfers: (used stedy)       General transfer comment: deferred due to safety     Balance Overall balance assessment: Needs assistance Sitting-balance support: No upper extremity supported;Feet supported Sitting balance-Leahy Scale: Fair     Standing balance support: Bilateral upper extremity supported Standing balance-Leahy Scale: Poor Standing balance comment: bil UE supported                            ADL either performed or assessed with clinical judgement   ADL Overall ADL's : Needs assistance/impaired     Grooming: Wash/dry hands;Wash/dry face;Supervision/safety;Set up;Sitting Grooming Details (indicate cue type and reason): using L UE to wash face and B hands with setup assist, encouraged forward reach in chair but patient declining      Lower Body Bathing: Maximal assistance;Sitting/lateral leans Lower Body Bathing Details (indicate cue type and reason): washing B thighs with increased time and effort, tactile and verbal cueing to complete task, limited distally; overall requires max assist                        General ADL Comments: increased time and effort to apply lotion to hands and upper legs      Vision       Perception     Praxis      Cognition Arousal/Alertness: Awake/alert Behavior During Therapy: Anxious;Restless Overall Cognitive Status: Impaired/Different from baseline Area of Impairment: Safety/judgement;Problem solving;Attention                   Current Attention Level: Sustained   Following Commands: Follows one step commands with increased time;Follows one step commands inconsistently Safety/Judgement: Decreased awareness of safety   Problem Solving: Slow processing;Decreased initiation;Difficulty sequencing;Requires verbal cues;Requires tactile cues General Comments: pt requires increased time and maximal encouargement to engage in tasks today        Exercises Exercises: General Upper Extremity;General Lower Extremity;Other exercises General Exercises - Upper Extremity Shoulder Flexion: AAROM;Both;5  reps;Other (comment);Limitations Shoulder Flexion Limitations: ROM to B UE to 90*, maximal encouargement to engage in exercises  Elbow Flexion: AROM;Both;5 reps;Seated Elbow Extension: AROM;Both;5 reps;Seated   Shoulder Instructions       General Comments family present and supportive     Pertinent Vitals/ Pain       Pain Assessment: Faces Faces Pain Scale: Hurts a little bit Pain Location: generalized pain  Pain Descriptors / Indicators: Grimacing Pain Intervention(s): Monitored during session;Repositioned;Limited activity within patient's tolerance  Home Living                                          Prior Functioning/Environment              Frequency  Min 2X/week        Progress Toward Goals  OT Goals(current goals can now be found in the care plan section)  Progress towards OT goals: Progressing toward goals  Acute Rehab OT Goals Patient Stated Goal: didn't state OT Goal Formulation: With patient Time For Goal Achievement: 01/14/19 Potential to Achieve Goals: Good  Plan Discharge plan remains appropriate;Frequency remains appropriate    Co-evaluation                 AM-PAC OT "6 Clicks" Daily Activity     Outcome Measure   Help from another person eating meals?: Total Help from another person taking care of personal grooming?: A Lot Help from another person toileting, which includes using toliet, bedpan, or urinal?: Total Help from another person bathing (including washing, rinsing, drying)?: Total Help from another person to put on and taking off regular upper body clothing?: Total Help from another person to put on and taking off regular lower body clothing?: Total 6 Click Score: 7    End of Session Equipment Utilized During Treatment: (vent)  OT Visit Diagnosis: Unsteadiness on feet (R26.81);Other abnormalities of gait and mobility (R26.89);Muscle weakness (generalized) (M62.81);Other symptoms and signs involving cognitive function   Activity Tolerance Patient tolerated treatment well   Patient Left in chair;with call bell/phone within reach;with chair alarm set;with family/visitor present   Nurse Communication Mobility status        Time: 2863-8177 OT Time Calculation (min): 17 min  Charges: OT  General Charges $OT Visit: 1 Visit OT Treatments $Self Care/Home Management : 8-22 mins  Chancy Milroy, OT Acute Rehabilitation Services Pager [phone] Office [phone]    Chancy Milroy 12/31/2018, 2:23 PM

## 2018-12-31 NOTE — Progress Notes (Signed)
Chest PT not performed this round. Pt was in chair.

## 2018-12-31 NOTE — Progress Notes (Signed)
Physical Therapy Treatment Patient Details Name: Antonio RichardsDaniel Watson Dillenburg Jr. MRN: [id number] DOB: Aug 14, 1956 Today's Date: 12/31/2018    History of Present Illness 63 yo admitted with flu A and PNA with acute respiratory failure intubated 1/4, trach 1/16 with course complicated by acute MI, CHF and cardiogenic shock and mucous plugging. PMhx: DM, HTN, HLD, CAD, Lt BKA, rt toe amputation    PT Comments    Pt very irritated and borderline aggressive/combative today. Pt threw shoe across floor and pushing PT and RN away. After max encouragement pt agreed to get in chair. Pt stood up in stedy with minAx2 and stood x 5 min for hygiene s/p loose stool. Pt completed 2 stands. Pt continuing to be on vent with lots of secretions requiring freq suctioning. Pts RR 38 during transfer. Acute PT to cont to follow.    Follow Up Recommendations  LTACH;Supervision/Assistance - 24 hour     Equipment Recommendations       Recommendations for Other Services       Precautions / Restrictions Precautions Precautions: Fall Precaution Comments: trach/vent, L BKA Restrictions Weight Bearing Restrictions: No    Mobility  Bed Mobility Overal bed mobility: Needs Assistance Bed Mobility: Rolling;Sidelying to Sit Rolling: Mod assist;Max assist Sidelying to sit: Mod assist;Max assist;+2 for physical assistance       General bed mobility comments: pt resistant and not helping today requiring maxA from RN and PT to complete transfer to EOB  Transfers Overall transfer level: Needs assistance   Transfers: Sit to/from Stand;Stand Pivot Transfers Sit to Stand: Mod assist;+2 physical assistance Stand pivot transfers: (used stedy)       General transfer comment: pt required max encouragement to stand, pt would only put on L prosthesis but not R shoe, pt threw shoe across the room. stedy then used to transfer pt to the chair.  Ambulation/Gait                 Stairs             Wheelchair  Mobility    Modified Rankin (Stroke Patients Only)       Balance Overall balance assessment: Needs assistance Sitting-balance support: No upper extremity supported;Feet supported Sitting balance-Leahy Scale: Fair     Standing balance support: Bilateral upper extremity supported Standing balance-Leahy Scale: Poor Standing balance comment: bil UE supported                            Cognition Arousal/Alertness: Awake/alert Behavior During Therapy: Anxious;Restless Overall Cognitive Status: Impaired/Different from baseline Area of Impairment: Safety/judgement;Problem solving;Attention                   Current Attention Level: Sustained   Following Commands: Follows one step commands with increased time;Follows one step commands inconsistently Safety/Judgement: Decreased awareness of safety   Problem Solving: Slow processing;Decreased initiation;Difficulty sequencing;Requires verbal cues;Requires tactile cues General Comments: pt very irritated today and resistant to therapy which is unlike him for us      Exercises      General Comments General comments (skin integrity, edema, etc.): pt with sacral wound on his bottom being managed by hydro      Pertinent Vitals/Pain Pain Assessment: Faces Faces Pain Scale: Hurts a little bit Pain Location: bottom with hygiene s/p loose stool Pain Descriptors / Indicators: Grimacing Pain Intervention(s): Monitored during session    Home Living  Prior Function            PT Goals (current goals can now be found in the care plan section) Progress towards PT goals: Not progressing toward goals - comment    Frequency    Min 3X/week      PT Plan Current plan remains appropriate    Co-evaluation              AM-PAC PT "6 Clicks" Mobility   Outcome Measure  Help needed turning from your back to your side while in a flat bed without using bedrails?: A Lot Help needed  moving from lying on your back to sitting on the side of a flat bed without using bedrails?: A Lot Help needed moving to and from a bed to a chair (including a wheelchair)?: A Lot Help needed standing up from a chair using your arms (e.g., wheelchair or bedside chair)?: A Lot Help needed to walk in hospital room?: Total Help needed climbing 3-5 steps with a railing? : Total 6 Click Score: 10    End of Session Equipment Utilized During Treatment: Gait belt Activity Tolerance: Patient tolerated treatment well Patient left: with call bell/phone within reach;in bed;with nursing/sitter in room Nurse Communication: Mobility status;Need for lift equipment PT Visit Diagnosis: Other abnormalities of gait and mobility (R26.89);Muscle weakness (generalized) (M62.81);Unsteadiness on feet (R26.81)     Time: 4356-8616 PT Time Calculation (min) (ACUTE ONLY): 39 min  Charges:  $Gait Training: 8-22 mins $Therapeutic Activity: 23-37 mins                     [name], PT, DPT Acute Rehabilitation Services Pager #: [phone] Office #: [phone]    [name] 12/31/2018, 1:03 PM

## 2018-12-31 NOTE — Progress Notes (Signed)
Physical Therapy Wound Treatment Patient Details  Name: Antonio [name]. MRN: [id number] Date of Birth: 06/23/1956  Today's Date: 12/31/2018 Time: 0910-0959 Time Calculation (min): 49 min  Subjective  Subjective: "Can I have food?" Patient and Family Stated Goals: Pt on vent Date of Onset: (unknown) Prior Treatments: dressing changes  Pain Score: pt flinching with pulsatile lavage  Wound Assessment  Pressure Injury 12/01/18 Unstageable - Full thickness tissue loss in which the base of the ulcer is covered by slough (yellow, tan, gray, green or brown) and/or eschar (tan, brown or black) in the wound bed. sacrum (Active)  Wound Image   12/31/2018 10:28 AM  Dressing Type Other (Comment);Barrier Film (skin prep) 12/31/2018 10:28 AM  Dressing Clean;Dry;Intact;Changed 12/31/2018 10:28 AM  Dressing Change Frequency Daily 12/31/2018 10:28 AM  State of Healing Early/partial granulation 12/31/2018 10:28 AM  Site / Wound Assessment Red;Yellow;Pink 12/31/2018 10:28 AM  % Wound base Red or Granulating 45% 12/31/2018 10:28 AM  % Wound base Yellow/Fibrinous Exudate 55% 12/31/2018 10:28 AM  % Wound base Black/Eschar 0% 12/31/2018 10:28 AM  % Wound base Other/Granulation Tissue (Comment) 0% 12/31/2018 10:28 AM  Peri-wound Assessment Erythema (blanchable) 12/31/2018 10:28 AM  Wound Length (cm) 9 cm 12/31/2018 10:28 AM  Wound Width (cm) 7 cm 12/31/2018 10:28 AM  Wound Depth (cm) 0.1 cm 12/31/2018 10:28 AM  Wound Surface Area (cm^2) 63 cm^2 12/31/2018 10:28 AM  Wound Volume (cm^3) 6.3 cm^3 12/31/2018 10:28 AM  Margins Unattached edges (unapproximated) 12/31/2018 10:28 AM  Drainage Amount Moderate 12/31/2018 10:28 AM  Drainage Description Purulent 12/31/2018 10:28 AM  Treatment Hydrotherapy (Pulse lavage);Other (Comment) 12/31/2018 10:28 AM     Aquacel Ag applied   Hydrotherapy Pulsed lavage therapy - wound location: sacrum Pulsed Lavage with Suction (psi): 8 psi Pulsed Lavage with Suction - Normal Saline Used: 1000 mL Pulsed  Lavage Tip: Tip with splash shield   Wound Assessment and Plan  Wound Therapy - Assess/Plan/Recommendations Wound Therapy - Clinical Statement: Wound improving with no blue/green drainage noted today. Pt unable to tolerate sharp debridement due to increased respiratory distress with right sidelying (RN present). Initiation of aquacel AG and decreased frequency of hydrotherapy to 3x/wk.  Wound Therapy - Functional Problem List: decr mobility Factors Delaying/Impairing Wound Healing: Diabetes Mellitus;Immobility;Multiple medical problems Hydrotherapy Plan: Debridement;Dressing change;Patient/family education;Pulsatile lavage with suction Wound Therapy - Frequency: 3X / week Wound Therapy - Follow Up Recommendations: Skilled nursing facility Wound Plan: see above  Wound Therapy Goals- Improve the function of patient's integumentary system by progressing the wound(s) through the phases of wound healing (inflammation - proliferation - remodeling) by: Decrease Necrotic Tissue to: 50 Decrease Necrotic Tissue - Progress: Progressing toward goal Increase Granulation Tissue to: 50 Increase Granulation Tissue - Progress: Progressing toward goal Goals/treatment plan/discharge plan were made with and agreed upon by patient/family: Yes Time For Goal Achievement: 7 days Wound Therapy - Potential for Goals: Fair  Goals will be updated until maximal potential achieved or discharge criteria met.  Discharge criteria: when goals achieved, discharge from hospital, MD decision/surgical intervention, no progress towards goals, refusal/missing three consecutive treatments without notification or medical reason.  GP    [name], PT, DPT Acute Rehabilitation Services Pager [phone] Office [phone]   [name] 12/31/2018, 11:10 AM

## 2019-01-01 LAB — CBC
HCT: 24.7 % — ABNORMAL LOW (ref 39.0–52.0)
Hemoglobin: 8.2 g/dL — ABNORMAL LOW (ref 13.0–17.0)
MCH: 28.3 pg (ref 26.0–34.0)
MCHC: 33.2 g/dL (ref 30.0–36.0)
MCV: 85.2 fL (ref 80.0–100.0)
Platelets: 258 10*3/uL (ref 150–400)
RBC: 2.9 MIL/uL — AB (ref 4.22–5.81)
RDW: 14.2 % (ref 11.5–15.5)
WBC: 6.8 10*3/uL (ref 4.0–10.5)
nRBC: 0 % (ref 0.0–0.2)

## 2019-01-01 LAB — GLUCOSE, CAPILLARY
Glucose-Capillary: 269 mg/dL — ABNORMAL HIGH (ref 70–99)
Glucose-Capillary: 225 mg/dL — ABNORMAL HIGH (ref 70–99)
Glucose-Capillary: 211 mg/dL — ABNORMAL HIGH (ref 70–99)
Glucose-Capillary: 193 mg/dL — ABNORMAL HIGH (ref 70–99)
Glucose-Capillary: 211 mg/dL — ABNORMAL HIGH (ref 70–99)
Glucose-Capillary: 225 mg/dL — ABNORMAL HIGH (ref 70–99)

## 2019-01-01 LAB — BASIC METABOLIC PANEL
ANION GAP: 8 (ref 5–15)
BUN: 37 mg/dL — ABNORMAL HIGH (ref 6–20)
CO2: 26 mmol/L (ref 22–32)
Calcium: 8.4 mg/dL — ABNORMAL LOW (ref 8.9–10.3)
Chloride: 99 mmol/L (ref 98–111)
Creatinine, Ser: 0.72 mg/dL (ref 0.61–1.24)
GFR calc non Af Amer: >60 mL/min (ref >60)
Glucose, Bld: 290 mg/dL — ABNORMAL HIGH (ref 70–99)
Potassium: 4.1 mmol/L (ref 3.5–5.1)
Sodium: 133 mmol/L — ABNORMAL LOW (ref 135–145)

## 2019-01-01 LAB — PHOSPHORUS: Phosphorus: 4.8 mg/dL — ABNORMAL HIGH (ref 2.5–4.6)

## 2019-01-01 LAB — MAGNESIUM: Magnesium: 1.8 mg/dL (ref 1.7–2.4)

## 2019-01-01 MED ORDER — INSULIN DETEMIR 100 UNIT/ML ~~LOC~~ SOLN
50.0000 [IU] | Freq: Two times a day (BID) | SUBCUTANEOUS | Status: DC
  Administered 2019-01-01 – 2019-01-12 (×22): 50 [IU] via SUBCUTANEOUS
  Filled 2019-01-01 (×24): qty 0.5

## 2019-01-01 NOTE — Progress Notes (Addendum)
  Speech Language Pathology Treatment: Antonio [name] Speaking valve  Patient Details Name: Antonio [name]. MRN: [id number] DOB: [birth year] Today's Date: 01/01/2019 Time: 0383-3383 SLP Time Calculation (min) (ACUTE ONLY): 31 min  Assessment / Plan / Recommendation Clinical Impression  Pt able to produce phonation during PMV trial with RT albeit hoarse and decreased intensity (yesterday pt unable to achieve vocalization despite attempts). RT switched pt to [facility] from PS for ease of respirations using PMV. Slow cuff deflation with RT and deep suctioning showing appropriate drop in Vte indicative of efficient exhalation through upper airway. He required mod-max coaxing to attempt to verbalize- approximately 30% intelligible in short phrases. RT then switched pt to trach collar with similar results in vocal quality/quantity. HR, RR and SpO2 were within normal range throughout session. Expectorated large amounts via trach with assist of valve to mobilize secretions. Recommend continued attempts with PMV with SLP only at present.    HPI HPI: 63 year old male coming in with fever cough chest pain and shortness of breath was found to have influenza A. Intubated 1/3-1/8. PMH: diabetes mellitus, hypertension. CXR 1/7 Low volume chest with interstitial coarsening and streaky basilar density. No effusion or pneumothorax.      SLP Plan  Continue with current plan of care       Recommendations         Patient may use Passy-Muir Speech Valve: with SLP only PMSV Supervision: Full MD: Please consider changing trach tube to : (requires vent periodically)         Oral Care Recommendations: Oral care QID Follow up Recommendations: 24 hour supervision/assistance;Skilled Nursing facility SLP Visit Diagnosis: Aphonia (R49.1) Plan: Continue with current plan of care       GO                Antonio [name] 01/01/2019, 12:52 PM Antonio [name].Ed Presenter, broadcasting [phone] Office [phone]

## 2019-01-01 NOTE — Plan of Care (Signed)
  Problem: Clinical Measurements: Goal: Ability to maintain clinical measurements within normal limits will improve Outcome: Progressing   

## 2019-01-01 NOTE — Progress Notes (Signed)
NAME:  Antonio Beamer., MRN:  [id number], DOB:  07-21-1956, LOS: 97 ADMISSION DATE:  11/28/2018, CONSULTATION DATE:  1/3 REFERRING MD:  EDP, CHIEF COMPLAINT:  Dyspnea   Brief History   63 year old male with influenza A causing respiratory failure and cardiogenic shock. Prolonged mechanical ventilation, tracheostomy placed on January 16.  Past Medical History  DM2, CAD, HTN  Significant Hospital Events   1/6 self extubated 1/7 re-intubated 1/8 self extubated 1/9 acute respiratory failure, mucus plugging right lower lobe, re-intubated, bronchoscopy 1/10 New fever T Max 103.1, antibiotics resumed with ceftaz and vancomycin 1/11: Hemodynamically seems to be improving. Very hypernatremic. X-ray improving. Adding free water, one-time Lasix, follow-up chemistry a.m. Hoping to initiate weaning efforts on 1/12 1/16. Continuing diuresis and ongoing ventilatory support. Working on weaning but still has significant work of breathing. Plan has been to proceed with tracheostomy. Hopefully we can continue ongoing diuresis and continue to decrease sedation 1/20 still struggling to wean. Secretions and anxiety are barriers.  1/21 trach revision to 6 distal XLT, fever, increased secretions, abx broadened / bronch 1/22 ongoing thick secretions 1/24 Failed SBT, low grade fever, Per CXR persistent pleural effusions / basilar consolidation atelectasis vs pneumonia per bases 1/25>> Mucus plugged with slow recovery after chest PT and Wound Care 2/3 > first time weaned for greater than 15 minutes (previously failed due to agitation, secretions, tachypnea)  Consults:  PCCM Cardiology   Procedures:  ETT 1/3 >>1/6, 1/7 > 1/8> 1/9>> 1/16 1/5 Right TL PICC >> 2/4 1/9 Bronch>> Mucus plugging RLL Trach 1/16 (JY) >>1/20 trach change failed and #4 cuffed 1/21 trach upsized to 6 cuffed XLT distal  1/17 right nare cortrak >> 1/29 1/28 gastrostomy tube >   Significant Diagnostic Tests:  UDS  1/4 >>negative  ECHO 1/4 >>LVEF 20-25%, akinesis of the anteroseptal, anterolateral & apical myocardium, LVEF 35-32%, grade 1 diastolic dysfunction, small pericardial effusion without evidence of hemodynamic compromise. CT Chest / ABD / Pelvis 1/3 >> airway thickening, motion artifact, CAD Echo 1/29>> LVEF 60-65%, left ventricular hypertrophy, trivial pericardial effusion  Micro Data:  RVP 1/3 >> positive for influenza A BCx2 1/3 >>No growth UC 1/4 >>No Growth Sputum Cx 1/7 >> Normal respiratory flora BAL >>corynebacterium  Blood Cx 1/10 > neg Sputum 1/21 > abundant corynebacterium striatum  Antimicrobials:  Vanco 1/3 x1;    Zosyn 1/30 x1  Tamiflu 1/3 >1/8 1/10 vancomycin>1/12 1/10 ceftaz >1/12;   1/21 >>1/22 1/12 emycin > 1/22 1/23 cefepime >> 1/24 1/21 Vanc> 1/28  Interim history/subjective:  Some agitation overnight. No complaints this morning. Tolerating 5/5 wean.   Objective   Blood pressure (!) 121/97, pulse 85, temperature 98.4 F (36.9 C), temperature source Axillary, resp. rate (!) 22, height 6' (1.829 m), weight 89.8 kg, SpO2 99 %.    Vent Mode: PRVC FiO2 (%):  [40 %] 40 % Set Rate:  [22 bmp] 22 bmp Vt Set:  [620 mL] 620 mL PEEP:  [5 cmH20] 5 cmH20 Plateau Pressure:  [14 cmH20-22 cmH20] 22 cmH20   Intake/Output Summary (Last 24 hours) at 01/01/2019 0851 Last data filed at 01/01/2019 0700 Gross per 24 hour  Intake 2420 ml  Output 0 ml  Net 2420 ml   Filed Weights   12/30/18 0500 12/31/18 0418 01/01/19 0500  Weight: 92 kg 93.3 kg 89.8 kg    Examination: General: Middle aged male, awake in bed watching TV Neuro: Alert, oriented, non-focal. Following commands.  HEENT: [place]/AT. Sclerae anicteric. Trach in place.  Cardiovascular:  RRR, no M/R/G. No edema.  Lungs: no distress. Clear Abdomen: BS x 4, soft, NT/ND. PEG Musculoskeletal: L BKA. Moving all extremities.  Skin: Sacral decubitus.    Resolved Hospital Problem list    AKI Pneumonia Hypokalemia Hypernatremia  Assessment & Plan:   Acute respiratory failure with hypoxemia Prolonged respiratory failure after flu a infection, Corynebacterium pneumonia. - Continue PSV as able (in the past has failed due to agitation, secretions, tachypnea). - ATC today if tolerated. RT aware of plan.  Lurline Idol care per respiratory.  - Mucomyst nebs off today.   Acute systolic heart failure - Follow-up echocardiogram with improvement of ejection fraction. At this point no plans for cardiac catheterization per cardiology - Continue Coreg and Cozaar, Lipitor. - Outpatient cardiology follow up  Nutrition: status post PEG tube by IR. - Continue tube feeds at goal.  Acute metabolic encephalopathy. - Continue Seroquel, clonazepam.  DM2. - Levemir - SSI  Deconditioning - Continue PT/OT efforts,  - Likely will require LTACH.  Sacral decub - wound care following.   Best practice:  Diet: tube feeding Pain/Anxiety/Delirium protocol (if indicated): as above, RASS goal 0 VAP protocol (if indicated): yes DVT prophylaxis: lovenox GI prophylaxis: famotidine Glucose control: SSI/Levemir Mobility: Continue PT efforts Code Status: Full Family Communication: No family at bedside today. Disposition: Doesn't have LTAC benefits, continue weaning efforts in hopes we can get him off ventilatory support and transition to ATC.   Georgann Housekeeper, AGACNP-BC [place] Pager [phone] or [phone]  01/01/2019 8:51 AM

## 2019-01-01 NOTE — Progress Notes (Signed)
eLink Physician-Brief Progress Note Patient Name: Antonio [name]. DOB: [birth year] MRN: [id number]   Date of Service  01/01/2019  HPI/Events of Note  Agitation raising concerns about potential self-extubation, nurse requesting bilateral soft wrist restraints for safety  eICU Interventions  Bilateral soft wrist restraints ordered        Daijah Scrivens U Teena Mangus 01/01/2019, 12:20 AM

## 2019-01-01 NOTE — Progress Notes (Signed)
Spoke to E-Link MD about patient using hands and popping ventilator tubing off from trach, and removing monitoring equipment. New orders received for bilateral soft wrist restraints. Will continue to monitor.

## 2019-01-02 LAB — BASIC METABOLIC PANEL
Anion gap: 14 (ref 5–15)
BUN: 35 mg/dL — ABNORMAL HIGH (ref 6–20)
CO2: 25 mmol/L (ref 22–32)
Calcium: 8.8 mg/dL — ABNORMAL LOW (ref 8.9–10.3)
Chloride: 97 mmol/L — ABNORMAL LOW (ref 98–111)
Creatinine, Ser: 0.72 mg/dL (ref 0.61–1.24)
Glucose, Bld: 209 mg/dL — ABNORMAL HIGH (ref 70–99)
Potassium: 4.6 mmol/L (ref 3.5–5.1)
Sodium: 136 mmol/L (ref 135–145)

## 2019-01-02 LAB — CBC
HCT: 27.9 % — ABNORMAL LOW (ref 39.0–52.0)
Hemoglobin: 9 g/dL — ABNORMAL LOW (ref 13.0–17.0)
MCH: 27.5 pg (ref 26.0–34.0)
MCHC: 32.3 g/dL (ref 30.0–36.0)
MCV: 85.3 fL (ref 80.0–100.0)
NRBC: 0 % (ref 0.0–0.2)
Platelets: 293 10*3/uL (ref 150–400)
RBC: 3.27 MIL/uL — ABNORMAL LOW (ref 4.22–5.81)
RDW: 13.9 % (ref 11.5–15.5)
WBC: 6.7 10*3/uL (ref 4.0–10.5)

## 2019-01-02 LAB — GLUCOSE, CAPILLARY
GLUCOSE-CAPILLARY: 235 mg/dL — AB (ref 70–99)
Glucose-Capillary: 211 mg/dL — ABNORMAL HIGH (ref 70–99)
Glucose-Capillary: 176 mg/dL — ABNORMAL HIGH (ref 70–99)
Glucose-Capillary: 174 mg/dL — ABNORMAL HIGH (ref 70–99)
Glucose-Capillary: 188 mg/dL — ABNORMAL HIGH (ref 70–99)
Glucose-Capillary: 174 mg/dL — ABNORMAL HIGH (ref 70–99)

## 2019-01-02 LAB — PHOSPHORUS: PHOSPHORUS: 4.1 mg/dL (ref 2.5–4.6)

## 2019-01-02 LAB — MAGNESIUM: Magnesium: 1.9 mg/dL (ref 1.7–2.4)

## 2019-01-02 MED ORDER — ORAL CARE MOUTH RINSE
15.0000 mL | Freq: Four times a day (QID) | OROMUCOSAL | Status: DC
  Administered 2019-01-03 – 2019-02-01 (×97): 15 mL via OROMUCOSAL

## 2019-01-02 MED FILL — Fentanyl TD Patch 72HR 75 MCG/HR: TRANSDERMAL | Qty: 1 | Status: AC

## 2019-01-02 NOTE — Progress Notes (Signed)
PT Cancellation Note  Patient Details Name: Mancel Kintzel. MRN: [id number] DOB: Apr 14, 1956   Cancelled Treatment:    Reason Eval/Treat Not Completed: Other (comment). Per discussion with RN, pt actively weaning on vent and requested hold on hydrotherapy this AM.   Laurina Bustle, PT, DPT Acute Rehabilitation Services Pager [phone] Office [phone]    Vanetta Mulders 01/02/2019, 8:53 AM

## 2019-01-02 NOTE — Evaluation (Signed)
Occupational Therapy Evaluation Patient Details Name: Cylis Frison. MRN: [id number] DOB: 12-01-1955 Today's Date: 01/02/2019    History of Present Illness 63 yo admitted with flu A and PNA with acute respiratory failure intubated 1/4, trach 1/16 with course complicated by acute MI, CHF and cardiogenic shock and mucous plugging. PMhx: DM, HTN, HLD, CAD, Lt BKA, rt toe amputation   Clinical Impression   Pt presents sitting up in recliner agreeable to working with OT today. Pt intermittently sleepy this session requiring cues to remain awake. Pt participated in A/AAROM to bil UEs for continued strengthening and endurance as precursor to completing ADL and functional transfers. Pt engaged in seated grooming and UB bathing task washing UEs, requiring encouragement to perform tasks on his own as he initially requests therapist assist. Pt family present during session and supportive. VSS overall throughout session completion. Will continue per POC.     Follow Up Recommendations  LTACH    Equipment Recommendations  Other (comment)(TBD in next venue)           Precautions / Restrictions Precautions Precautions: Fall Precaution Comments: trach/vent, L BKA, PEG Restrictions Weight Bearing Restrictions: No      Mobility Bed Mobility Overal bed mobility: Needs Assistance       Supine to sit: Min assist;HOB elevated;+2 for safety/equipment     General bed mobility comments: Pt received sitting up in recliner  Transfers Overall transfer level: Needs assistance   Transfers: Sit to/from Stand;Stand Pivot Transfers Sit to Stand: Min assist;+2 safety/equipment Stand pivot transfers: Min assist;+2 safety/equipment       General transfer comment: pt able to don prosthesis onto sleeve EOB with cues for hand placement and pt standing while holding stabilized RW from regular height bed. Pt able to step with RW and pivot to chair min assist and remain standing grossly 1 min total with  RW today. +2 for lines and safety    Balance Overall balance assessment: Needs assistance Sitting-balance support: No upper extremity supported;Feet supported Sitting balance-Leahy Scale: Fair     Standing balance support: Bilateral upper extremity supported Standing balance-Leahy Scale: Poor                             ADL either performed or assessed with clinical judgement   ADL       Grooming: Wash/dry hands;Set up;Sitting   Upper Body Bathing: Set up;Sitting Upper Body Bathing Details (indicate cue type and reason): washing UEs seated in recliner, pt requires encouragement to perform on his own as he initially requests therapist to complete                                                     Pertinent Vitals/Pain Pain Assessment: Faces Faces Pain Scale: Hurts little more Pain Location: buttocks Pain Descriptors / Indicators: Discomfort;Sore Pain Intervention(s): Monitored during session;Repositioned     Hand Dominance     Extremity/Trunk Assessment             Communication     Cognition Arousal/Alertness: Awake/alert;Lethargic Behavior During Therapy: WFL for tasks assessed/performed Overall Cognitive Status: Impaired/Different from baseline Area of Impairment: Safety/judgement;Problem solving;Attention;Orientation                 Orientation Level: Disoriented to;Time Current Attention Level: Selective  Following Commands: Follows one step commands with increased time;Follows one step commands inconsistently Safety/Judgement: Decreased awareness of safety   Problem Solving: Slow processing;Decreased initiation;Difficulty sequencing;Requires verbal cues;Requires tactile cues General Comments: HOH and increased time and repetition at time but [facility]; pt occasionally distracted by individuals in hallway requiring cues to attend to task at hand. pt dozing off end of session requiring cues to  remain awake; he did ask end of session if he would see therapy on Saturday/Sunday, explained typical therapy schedule and ecouraged OOB to chair with nursing over weekend    General Comments       Exercises Exercises: General Upper Extremity General Exercises - Upper Extremity Shoulder Flexion: AAROM;Both;15 reps;Limitations Shoulder Flexion Limitations: grossly 0-45* LUE, 0-90* RUE  Elbow Flexion: AROM;Both;10 reps;Seated   Shoulder Instructions      Home Living                                          Prior Functioning/Environment                   OT Problem List:        OT Treatment/Interventions:      OT Goals(Current goals can be found in the care plan section) Acute Rehab OT Goals Patient Stated Goal: to rest OT Goal Formulation: With patient Time For Goal Achievement: 01/14/19 Potential to Achieve Goals: Good ADL Goals Pt Will Perform Grooming: with set-up;with supervision Pt Will Perform Upper Body Dressing: with min guard assist Pt Will Transfer to Toilet: with min assist;bedside commode Additional ADL Goal #1: Pt will perform bed mobility with Min A in preparation for ADLs Additional ADL Goal #2: Pt will sit at EOB for ~5 minutes with Min Guard A in preparation for ADLs Additional ADL Goal #3: Pt will follow 50% on cues in quiet environement during ADL  OT Frequency: Min 2X/week   Barriers to D/C:            Co-evaluation              AM-PAC OT "6 Clicks" Daily Activity     Outcome Measure Help from another person eating meals?: Total Help from another person taking care of personal grooming?: A Lot Help from another person toileting, which includes using toliet, bedpan, or urinal?: Total Help from another person bathing (including washing, rinsing, drying)?: A Lot Help from another person to put on and taking off regular upper body clothing?: Total Help from another person to put on and taking off regular lower body  clothing?: Total 6 Click Score: 8   End of Session Equipment Utilized During Treatment: Oxygen;Other (comment)(oxygen via trach) Nurse Communication: Mobility status;Other (comment)(peg tube feeding complete)  Activity Tolerance: Patient tolerated treatment well(sleepy end of session) Patient left: in chair;with call bell/phone within reach;with chair alarm set;with family/visitor present  OT Visit Diagnosis: Unsteadiness on feet (R26.81);Other abnormalities of gait and mobility (R26.89);Muscle weakness (generalized) (M62.81);Other symptoms and signs involving cognitive function                Time: [id number] OT Time Calculation (min): 21 min Charges:  OT General Charges $OT Visit: 1 Visit OT Treatments $Self Care/Home Management : 8-22 mins  Marcy SirenBreanna Marilene Vath, OT Supplemental Rehabilitation Services Pager [phone] Office [phone]   Orlando PennerBreanna L Dorma Altman 01/02/2019, 1:19 PM

## 2019-01-02 NOTE — Progress Notes (Signed)
Physical Therapy Wound Treatment Patient Details  Name: Antonio [name]. MRN: [id number] Date of Birth: [birth year]  Today's Date: 01/02/2019 Time: 0102-7253 Time Calculation (min): 35 min  Subjective  Subjective: "That was wonderful." Patient and Family Stated Goals: Pt on trach collar Date of Onset: (unknown) Prior Treatments: dressing changes  Pain Score:  Pt flinching during pulse lavage, otherwise tolerated well; Pre medicated  Wound Assessment  Pressure Injury 12/01/18 Unstageable - Full thickness tissue loss in which the base of the ulcer is covered by slough (yellow, tan, gray, green or brown) and/or eschar (tan, brown or black) in the wound bed. sacrum (Active)  Dressing Type Barrier Film (skin prep);Silver hydrofiber;ABD 01/02/2019  3:51 PM  Dressing Clean;Dry;Intact;Changed 01/02/2019  3:51 PM  Dressing Change Frequency Daily 01/02/2019  3:51 PM  State of Healing Early/partial granulation 01/02/2019  3:51 PM  Site / Wound Assessment Red;Yellow;Pink 01/02/2019  3:51 PM  % Wound base Red or Granulating 50% 01/02/2019  3:51 PM  % Wound base Yellow/Fibrinous Exudate 50% 01/02/2019  3:51 PM  % Wound base Black/Eschar 0% 01/02/2019  3:51 PM  % Wound base Other/Granulation Tissue (Comment) 0% 01/02/2019  3:51 PM  Peri-wound Assessment Erythema (blanchable) 01/02/2019  3:51 PM  Wound Length (cm) 9 cm 12/31/2018 10:28 AM  Wound Width (cm) 7 cm 12/31/2018 10:28 AM  Wound Depth (cm) 0.1 cm 12/31/2018 10:28 AM  Wound Surface Area (cm^2) 63 cm^2 12/31/2018 10:28 AM  Wound Volume (cm^3) 6.3 cm^3 12/31/2018 10:28 AM  Tunneling (cm) 0 12/31/2018 10:28 AM  Margins Unattached edges (unapproximated) 01/02/2019  3:51 PM  Drainage Amount Scant 01/02/2019  3:51 PM  Drainage Description Serous 01/02/2019  3:51 PM  Treatment Debridement (Selective);Hydrotherapy (Pulse lavage) 01/02/2019  3:51 PM      Hydrotherapy Pulsed lavage therapy - wound location: sacrum Pulsed Lavage with Suction (psi): 8 psi Pulsed Lavage with  Suction - Normal Saline Used: 1000 mL Pulsed Lavage Tip: Tip with splash shield Selective Debridement Selective Debridement - Location: sacrum Selective Debridement - Tools Used: Forceps;Scissors Selective Debridement - Tissue Removed: yellow necrotic tissue   Wound Assessment and Plan  Wound Therapy - Assess/Plan/Recommendations Wound Therapy - Clinical Statement: Wound improving with no blue/green drainage noted. Likely can discharge hydrotherapy next week. Wound Therapy - Functional Problem List: decr mobility Factors Delaying/Impairing Wound Healing: Diabetes Mellitus;Immobility;Multiple medical problems Hydrotherapy Plan: Debridement;Dressing change;Patient/family education;Pulsatile lavage with suction Wound Therapy - Frequency: 3X / week Wound Therapy - Follow Up Recommendations: Skilled nursing facility Wound Plan: see above  Wound Therapy Goals- Improve the function of patient's integumentary system by progressing the wound(s) through the phases of wound healing (inflammation - proliferation - remodeling) by: Decrease Necrotic Tissue to: 40 Decrease Necrotic Tissue - Progress: Updated due to goal met Increase Granulation Tissue to: 60 Increase Granulation Tissue - Progress: Updated due to goal met Goals/treatment plan/discharge plan were made with and agreed upon by patient/family: Yes Time For Goal Achievement: 7 days Wound Therapy - Potential for Goals: Fair  Goals will be updated until maximal potential achieved or discharge criteria met.  Discharge criteria: when goals achieved, discharge from hospital, MD decision/surgical intervention, no progress towards goals, refusal/missing three consecutive treatments without notification or medical reason.  GP  [name], PT, DPT Acute Rehabilitation Services Pager [phone] Office [phone]      [name] 01/02/2019, 3:58 PM

## 2019-01-02 NOTE — Progress Notes (Signed)
Physical Therapy Treatment Patient Details Name: Antonio [name]. MRN: [id number] DOB: 12-27-1955 Today's Date: 01/02/2019    History of Present Illness 63 yo admitted with flu A and PNA with acute respiratory failure intubated 1/4, trach 1/16 with course complicated by acute MI, CHF and cardiogenic shock and mucous plugging. PMhx: DM, HTN, HLD, CAD, Lt BKA, rt toe amputation    PT Comments    Pt pleasant and willing to mobilize on CPAP/PS on vent with Fio2 40% peep5 and SpO2 95-98% during session with RR 24-38 with cues for breathing. Pt with greatly improved transfers, balance and mobility. Pt disoriented to time and requires assist for problem solving but able to stand and actually step to pivot with RW this session and pending respiratory status and RT may be able to begin gait next session. Pt deferred further standing trials this visit with arrival of parents.     Follow Up Recommendations  LTACH;Supervision/Assistance - 24 hour     Equipment Recommendations  Rolling walker with 5" wheels    Recommendations for Other Services       Precautions / Restrictions Precautions Precautions: Fall Precaution Comments: trach/vent, L BKA, PEG    Mobility  Bed Mobility Overal bed mobility: Needs Assistance       Supine to sit: Min assist;HOB elevated;+2 for safety/equipment     General bed mobility comments: HOB 20 degrees with min HHA and verbal cues to initiate   Transfers Overall transfer level: Needs assistance   Transfers: Sit to/from Stand;Stand Pivot Transfers Sit to Stand: Min assist;+2 safety/equipment Stand pivot transfers: Min assist;+2 safety/equipment       General transfer comment: pt able to don prosthesis onto sleeve EOB with cues for hand placement and pt standing while holding stabilized RW from regular height bed. Pt able to step with RW and pivot to chair min assist and remain standing grossly 1 min total with RW today. +2 for lines and  safety  Ambulation/Gait             General Gait Details: may be able to perform next session   Stairs             Wheelchair Mobility    Modified Rankin (Stroke Patients Only)       Balance Overall balance assessment: Needs assistance Sitting-balance support: No upper extremity supported;Feet supported Sitting balance-Leahy Scale: Fair     Standing balance support: Bilateral upper extremity supported Standing balance-Leahy Scale: Poor                              Cognition Arousal/Alertness: Awake/alert Behavior During Therapy: WFL for tasks assessed/performed Overall Cognitive Status: Impaired/Different from baseline Area of Impairment: Safety/judgement;Problem solving;Attention;Orientation                 Orientation Level: Disoriented to;Time Current Attention Level: Selective   Following Commands: Follows one step commands with increased time;Follows one step commands inconsistently Safety/Judgement: Decreased awareness of safety   Problem Solving: Slow processing;Decreased initiation;Difficulty sequencing;Requires verbal cues;Requires tactile cues General Comments: HOH and increased time and repetition at time but unclear if due to [facility] or cognition. Pt with difficulty donning neoprene sleeve x 2 today and unable to identify error of not rolling sleeve all the way down prior to donning even after cues      Exercises      General Comments        Pertinent Vitals/Pain Pain Assessment: No/denies  pain    Home Living                      Prior Function            PT Goals (current goals can now be found in the care plan section) Progress towards PT goals: Progressing toward goals    Frequency    Min 3X/week      PT Plan Current plan remains appropriate    Co-evaluation              AM-PAC PT "6 Clicks" Mobility   Outcome Measure  Help needed turning from your back to your side while in a flat bed  without using bedrails?: A Little Help needed moving from lying on your back to sitting on the side of a flat bed without using bedrails?: A Little Help needed moving to and from a bed to a chair (including a wheelchair)?: A Little Help needed standing up from a chair using your arms (e.g., wheelchair or bedside chair)?: A Little Help needed to walk in hospital room?: A Lot Help needed climbing 3-5 steps with a railing? : Total 6 Click Score: 15    End of Session Equipment Utilized During Treatment: Gait belt Activity Tolerance: Patient tolerated treatment well Patient left: with call bell/phone within reach;in bed;with family/visitor present;with chair alarm set Nurse Communication: Mobility status PT Visit Diagnosis: Other abnormalities of gait and mobility (R26.89);Muscle weakness (generalized) (M62.81);Unsteadiness on feet (R26.81)     Time: 1610-9604 PT Time Calculation (min) (ACUTE ONLY): 30 min  Charges:  $Therapeutic Activity: 23-37 mins                     Gurdeep Keesey Abner Greenspan, PT Acute Rehabilitation Services Pager: [phone] Office: [phone]    Enedina Finner Nioka Thorington 01/02/2019, 10:41 AM

## 2019-01-02 NOTE — Progress Notes (Signed)
NAME:  Antonio [name]., MRN:  [id number], DOB:  11/19/1956, LOS: 48 ADMISSION DATE:  11/28/2018, CONSULTATION DATE:  1/3 REFERRING MD:  EDP, CHIEF COMPLAINT:  Dyspnea   Brief History   63 year old male with influenza A causing respiratory failure and cardiogenic shock. Prolonged mechanical ventilation, tracheostomy placed on January 16.  Past Medical History  DM2, CAD, HTN  Significant Hospital Events   1/6 self extubated 1/7 re-intubated 1/8 self extubated 1/9 acute respiratory failure, mucus plugging right lower lobe, re-intubated, bronchoscopy 1/10 New fever T Max 103.1, antibiotics resumed with ceftaz and vancomycin 1/11: Hemodynamically seems to be improving. Very hypernatremic. X-ray improving. Adding free water, one-time Lasix, follow-up chemistry a.m. Hoping to initiate weaning efforts on 1/12 1/16. Continuing diuresis and ongoing ventilatory support. Working on weaning but still has significant work of breathing. Plan has been to proceed with tracheostomy. Hopefully we can continue ongoing diuresis and continue to decrease sedation 1/20 still struggling to wean. Secretions and anxiety are barriers.  1/21 trach revision to 6 distal XLT, fever, increased secretions, abx broadened / bronch 1/22 ongoing thick secretions 1/24 Failed SBT, low grade fever, Per CXR persistent pleural effusions / basilar consolidation atelectasis vs pneumonia per bases 1/25>> Mucus plugged with slow recovery after chest PT and Wound Care 2/3 > first time weaned for greater than 15 minutes (previously failed due to agitation, secretions, tachypnea)  Consults:  PCCM Cardiology   Procedures:  ETT 1/3 >>1/6, 1/7 > 1/8> 1/9>> 1/16 1/5 Right TL PICC >> 2/4 1/9 Bronch>> Mucus plugging RLL Trach 1/16 (JY) >>1/20 trach change failed and #4 cuffed 1/21 trach upsized to 6 cuffed XLT distal  1/17 right nare cortrak >> 1/29 1/28 gastrostomy tube >   Significant Diagnostic Tests:  UDS  1/4 >>negative  ECHO 1/4 >>LVEF 20-25%, akinesis of the anteroseptal, anterolateral & apical myocardium, LVEF 03-54%, grade 1 diastolic dysfunction, small pericardial effusion without evidence of hemodynamic compromise. CT Chest / ABD / Pelvis 1/3 >> airway thickening, motion artifact, CAD Echo 1/29>> LVEF 60-65%, left ventricular hypertrophy, trivial pericardial effusion  Micro Data:  RVP 1/3 >> positive for influenza A BCx2 1/3 >>No growth UC 1/4 >>No Growth Sputum Cx 1/7 >> Normal respiratory flora BAL >>corynebacterium  Blood Cx 1/10 > neg Sputum 1/21 > abundant corynebacterium striatum  Antimicrobials:  Vanco 1/3 x1;    Zosyn 1/30 x1  Tamiflu 1/3 >1/8 1/10 vancomycin>1/12 1/10 ceftaz >1/12;   1/21 >>1/22 1/12 emycin > 1/22 1/23 cefepime >> 1/24 1/21 Vanc> 1/28  Interim history/subjective:  Some agitation overnight. No complaints this morning. Tolerating 5/5 wean.   Objective   Blood pressure (!) 119/56, pulse 94, temperature 98.9 F (37.2 C), temperature source Oral, resp. rate 18, height 6' (1.829 m), weight 91 kg, SpO2 96 %.    Vent Mode: PSV;CPAP FiO2 (%):  [40 %] 40 % Set Rate:  [22 bmp] 22 bmp Vt Set:  [620 mL] 620 mL PEEP:  [5 cmH20] 5 cmH20 Pressure Support:  [5 cmH20] 5 cmH20 Plateau Pressure:  [17 cmH20-19 cmH20] 17 cmH20   Intake/Output Summary (Last 24 hours) at 01/02/2019 0917 Last data filed at 01/02/2019 0700 Gross per 24 hour  Intake 1330 ml  Output 1750 ml  Net -420 ml   Filed Weights   12/31/18 0418 01/01/19 0500 01/02/19 0500  Weight: 93.3 kg 89.8 kg 91 kg    Examination: General: Hyperinflated chest, male who is awake and follows commands HEENT: Trachea with thick secretions Neuro: Moves extremities. CV:  Heart sounds are distant PULM: even/non-labored, lungs bilaterally decreased air movement bilaterally IR:WERX, non-tender, bsx4 active  Extremities: Left BKA is noted, right foot with him be hemi-metatarsal removal Skin: no rashes  or lesions    Resolved Hospital Problem list   AKI Pneumonia Hypokalemia Hypernatremia  Assessment & Plan:   Acute respiratory failure with hypoxemia Prolonged respiratory failure after flu a infection, Corynebacterium pneumonia. Currently on 5/5 with tidal volumes 500 cc range but with his severe COPD he may not be able to go on trach collar today Continue pulmonary toilet  Acute systolic heart failure - Follow-up echocardiogram with improvement of ejection fraction. At this point no plans for cardiac catheterization per cardiology Continue Coreg Cozaar Lipitor Outpatient cardiology follow-up  Nutrition: status post PEG tube by IR. Tube feedings at goal  Acute metabolic encephalopathy. Continue Seroquel clonazepam  DM2. Sliding scale insulin protocol  Deconditioning PT and OT as tolerated LTAC when available  Sacral decub Wound care is following  Best practice:  Diet: tube feeding Pain/Anxiety/Delirium protocol (if indicated): as above, RASS goal 0 VAP protocol (if indicated): yes DVT prophylaxis: lovenox GI prophylaxis: famotidine Glucose control: SSI/Levemir Mobility: Continue PT efforts Code Status: Full Family Communication: 01/02/2019 no family at bedside Disposition: Doesn't have LTAC benefits, continue weaning efforts in hopes we can get him off ventilatory support and transition to ATC.   Richardson Landry Decklin Weddington ACNP [facility]anna [facility] PCCM Pager [phone] till 1 pm If no answer page [phone] 01/02/2019, 9:17 AM

## 2019-01-03 ENCOUNTER — Inpatient Hospital Stay (HOSPITAL_COMMUNITY): Payer: Medicaid Other

## 2019-01-03 LAB — GLUCOSE, CAPILLARY
GLUCOSE-CAPILLARY: 231 mg/dL — AB (ref 70–99)
Glucose-Capillary: 132 mg/dL — ABNORMAL HIGH (ref 70–99)
Glucose-Capillary: 165 mg/dL — ABNORMAL HIGH (ref 70–99)
Glucose-Capillary: 91 mg/dL (ref 70–99)
Glucose-Capillary: 129 mg/dL — ABNORMAL HIGH (ref 70–99)
Glucose-Capillary: 155 mg/dL — ABNORMAL HIGH (ref 70–99)

## 2019-01-03 LAB — BASIC METABOLIC PANEL
Anion gap: 9 (ref 5–15)
BUN: 41 mg/dL — ABNORMAL HIGH (ref 6–20)
CO2: 28 mmol/L (ref 22–32)
CREATININE: 0.69 mg/dL (ref 0.61–1.24)
Calcium: 8.7 mg/dL — ABNORMAL LOW (ref 8.9–10.3)
Chloride: 101 mmol/L (ref 98–111)
GFR calc Af Amer: >60 mL/min (ref >60)
GFR calc non Af Amer: >60 mL/min (ref >60)
Glucose, Bld: 141 mg/dL — ABNORMAL HIGH (ref 70–99)
Potassium: 4.2 mmol/L (ref 3.5–5.1)
Sodium: 138 mmol/L (ref 135–145)

## 2019-01-03 LAB — PHOSPHORUS: Phosphorus: 3.9 mg/dL (ref 2.5–4.6)

## 2019-01-03 LAB — MAGNESIUM: Magnesium: 2.2 mg/dL (ref 1.7–2.4)

## 2019-01-03 NOTE — Progress Notes (Signed)
NAME:  Antonio Blatz., MRN:  [id number], DOB:  18-Mar-1956, LOS: 62 ADMISSION DATE:  11/28/2018, CONSULTATION DATE:  1/3 REFERRING MD:  EDP, CHIEF COMPLAINT:  Dyspnea   Brief History   63 year old male with influenza A causing respiratory failure and cardiogenic shock. Prolonged mechanical ventilation, tracheostomy placed on January 16.  Past Medical History  DM2, CAD, HTN  Significant Hospital Events   1/6 self extubated 1/7 re-intubated 1/8 self extubated 1/9 acute respiratory failure, mucus plugging right lower lobe, re-intubated, bronchoscopy 1/10 New fever T Max 103.1, antibiotics resumed with ceftaz and vancomycin 1/11: Hemodynamically seems to be improving. Very hypernatremic. X-ray improving. Adding free water, one-time Lasix, follow-up chemistry a.m. Hoping to initiate weaning efforts on 1/12 1/16. Continuing diuresis and ongoing ventilatory support. Working on weaning but still has significant work of breathing. Plan has been to proceed with tracheostomy. Hopefully we can continue ongoing diuresis and continue to decrease sedation 1/20 still struggling to wean. Secretions and anxiety are barriers.  1/21 trach revision to 6 distal XLT, fever, increased secretions, abx broadened / bronch 1/22 ongoing thick secretions 1/24 Failed SBT, low grade fever, Per CXR persistent pleural effusions / basilar consolidation atelectasis vs pneumonia per bases 1/25>> Mucus plugged with slow recovery after chest PT and Wound Care 2/3 > first time weaned for greater than 15 minutes (previously failed due to agitation, secretions, tachypnea)  Consults:  PCCM Cardiology   Procedures:  ETT 1/3 >>1/6, 1/7 > 1/8> 1/9>> 1/16 1/5 Right TL PICC >> 2/4 1/9 Bronch>> Mucus plugging RLL Trach 1/16 (JY) >>1/20 trach change failed and #4 cuffed 1/21 trach upsized to 6 cuffed XLT distal  1/17 right nare cortrak >> 1/29 1/28 gastrostomy tube >   Significant Diagnostic Tests:  UDS  1/4 >>negative  ECHO 1/4 >>LVEF 20-25%, akinesis of the anteroseptal, anterolateral & apical myocardium, LVEF 84-16%, grade 1 diastolic dysfunction, small pericardial effusion without evidence of hemodynamic compromise. CT Chest / ABD / Pelvis 1/3 >> airway thickening, motion artifact, CAD Echo 1/29>> LVEF 60-65%, left ventricular hypertrophy, trivial pericardial effusion  Micro Data:  RVP 1/3 >> positive for influenza A BCx2 1/3 >>No growth UC 1/4 >>No Growth Sputum Cx 1/7 >> Normal respiratory flora BAL >>corynebacterium  Blood Cx 1/10 > neg Sputum 1/21 > abundant corynebacterium striatum  Antimicrobials:  Vanco 1/3 x1;    Zosyn 1/30 x1  Tamiflu 1/3 >1/8 1/10 vancomycin>1/12 1/10 ceftaz >1/12;   1/21 >>1/22 1/12 emycin > 1/22 1/23 cefepime >> 1/24 1/21 Vanc> 1/28  Interim history/subjective:  Currently tolerating trach collar will attempt to leave that on 24 hours. X-ray and laboratory holiday planned for 01/04/2019  Objective   Blood pressure 121/69, pulse 90, temperature 99.3 F (37.4 C), temperature source Oral, resp. rate (!) 22, height 6' (1.829 m), weight 90.1 kg, SpO2 95 %.    Vent Mode: PRVC FiO2 (%):  [40 %] 40 % Set Rate:  [22 bmp] 22 bmp Vt Set:  [606 mL] 620 mL PEEP:  [5 cmH20] 5 cmH20 Plateau Pressure:  [21 cmH20-25 cmH20] 23 cmH20   Intake/Output Summary (Last 24 hours) at 01/03/2019 1001 Last data filed at 01/03/2019 0600 Gross per 24 hour  Intake 385 ml  Output 800 ml  Net -415 ml   Filed Weights   01/01/19 0500 01/02/19 0500 01/03/19 0500  Weight: 89.8 kg 91 kg 90.1 kg    Examination: General: Male who appears older than stated age 24: Colostomy in place Neuro: Moves extremities follows commands right  side is weaker CV: Sounds are regular PULM: even/non-labored, lungs bilaterally decreased in bases KK:OECX, non-tender, bsx4 active, PEG in place Extremities: Left BKA, MD metatarsal.  Surgery right foot Skin: no rashes or  lesions    Resolved Hospital Problem list   AKI Pneumonia Hypokalemia Hypernatremia  Assessment & Plan:   Acute respiratory failure with hypoxemia Prolonged respiratory failure after flu a infection, Corynebacterium pneumonia. Currently on trach collar Level trach collar as tolerated Pulmonary toilet Note his severe underlying COPD may preclude maintaining trach collar greater than 48 hours  Acute systolic heart failure - Follow-up echocardiogram with improvement of ejection fraction. At this point no plans for cardiac catheterization per cardiology  Continue Coreg Cozaar and Lipitor Further cardiology evaluation as an outpatient  Nutrition: status post PEG tube by IR. Tube feedings via PEG and at goal  Acute metabolic encephalopathy. Continue Seroquel clonazepam  DM2. CBG (last 3)  Recent Labs    01/02/19 2327 01/03/19 0326 01/03/19 0829  GLUCAP 174* 132* 165*    Continue sliding scale insulin  Deconditioning Physical therapy occupational therapy LTAC when available  Sacral decub Being followed by wound care  Best practice:  Diet: tube feeding Pain/Anxiety/Delirium protocol (if indicated): as above, RASS goal 0 VAP protocol (if indicated): yes DVT prophylaxis: lovenox GI prophylaxis: famotidine Glucose control: SSI/Levemir Mobility: Continue PT efforts Code Status: Full Family Communication: 01/03/2019 no family at bedside Disposition: Doesn't have LTAC benefits, continue weaning efforts in hopes we can get him off ventilatory support and transition to ATC.   Richardson Landry Arlie Posch ACNP [facility] PCCM Pager [phone] till 1 pm If no answer page [phone] 01/03/2019, 10:01 AM

## 2019-01-03 NOTE — Progress Notes (Addendum)
NAME:  Antonio Chaddock., MRN:  [id number], DOB:  October 18, 1956, LOS: 64 ADMISSION DATE:  11/28/2018, CONSULTATION DATE:  1/3 REFERRING MD:  EDP, CHIEF COMPLAINT:  Dyspnea   Brief History   63 year old male with influenza A causing respiratory failure and cardiogenic shock. Prolonged mechanical ventilation, tracheostomy placed on January 16.  Past Medical History  DM2, CAD, HTN  Significant Hospital Events   1/6 self extubated 1/7 re-intubated 1/8 self extubated 1/9 acute respiratory failure, mucus plugging right lower lobe, re-intubated, bronchoscopy 1/10 New fever T Max 103.1, antibiotics resumed with ceftaz and vancomycin 1/11: Hemodynamically seems to be improving. Very hypernatremic. X-ray improving. Adding free water, one-time Lasix, follow-up chemistry a.m. Hoping to initiate weaning efforts on 1/12 1/16. Continuing diuresis and ongoing ventilatory support. Working on weaning but still has significant work of breathing. Plan has been to proceed with tracheostomy. Hopefully we can continue ongoing diuresis and continue to decrease sedation 1/20 still struggling to wean. Secretions and anxiety are barriers.  1/21 trach revision to 6 distal XLT, fever, increased secretions, abx broadened / bronch 1/22 ongoing thick secretions 1/24 Failed SBT, low grade fever, Per CXR persistent pleural effusions / basilar consolidation atelectasis vs pneumonia per bases 1/25>> Mucus plugged with slow recovery after chest PT and Wound Care 2/3 > first time weaned for greater than 15 minutes (previously failed due to agitation, secretions, tachypnea)  Consults:  PCCM Cardiology   Procedures:  ETT 1/3 >>1/6, 1/7 > 1/8> 1/9>> 1/16 1/5 Right TL PICC >> 2/4 1/9 Bronch>> Mucus plugging RLL Trach 1/16 (JY) >>1/20 trach change failed and #4 cuffed 1/21 trach upsized to 6 cuffed XLT distal  1/17 right nare cortrak >> 1/29 1/28 gastrostomy tube >   Significant Diagnostic Tests:  UDS  1/4 >>negative  ECHO 1/4 >>LVEF 20-25%, akinesis of the anteroseptal, anterolateral & apical myocardium, LVEF 37-85%, grade 1 diastolic dysfunction, small pericardial effusion without evidence of hemodynamic compromise. CT Chest / ABD / Pelvis 1/3 >> airway thickening, motion artifact, CAD Echo 1/29>> LVEF 60-65%, left ventricular hypertrophy, trivial pericardial effusion  Micro Data:  RVP 1/3 >> positive for influenza A BCx2 1/3 >>No growth UC 1/4 >>No Growth Sputum Cx 1/7 >> Normal respiratory flora BAL >>corynebacterium  Blood Cx 1/10 > neg Sputum 1/21 > abundant corynebacterium striatum  Antimicrobials:  Vanco 1/3 x1;    Zosyn 1/30 x1  Tamiflu 1/3 >1/8 1/10 vancomycin>1/12 1/10 ceftaz >1/12;   1/21 >>1/22 1/12 emycin > 1/22 1/23 cefepime >> 1/24 1/21 Vanc> 1/28  Interim history/subjective:  Tolerating trach collar.  ~Attempt to go greater than  24 hours. Objective   Blood pressure 121/69, pulse 90, temperature 99.3 F (37.4 C), temperature source Oral, resp. rate (!) 22, height 6' (1.829 m), weight 90.1 kg, SpO2 95 %.    Vent Mode: PRVC FiO2 (%):  [40 %] 40 % Set Rate:  [22 bmp] 22 bmp Vt Set:  [620 mL] 620 mL PEEP:  [5 cmH20] 5 cmH20 Plateau Pressure:  [21 cmH20-25 cmH20] 23 cmH20   Intake/Output Summary (Last 24 hours) at 01/03/2019 1008 Last data filed at 01/03/2019 0600 Gross per 24 hour  Intake 385 ml  Output 800 ml  Net -415 ml   Filed Weights   01/01/19 0500 01/02/19 0500 01/03/19 0500  Weight: 89.8 kg 91 kg 90.1 kg    Examination: General: Peers older than stated age 73: Tracheostomy in place Neuro: Follows commands CV: s1s2 rrr, no m/r/g PULM: even/non-labored, lungs bilaterally creased in the bases  hyperinflated chest HR:CBUL, non-tender, bsx4 active  Extremities: 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 Tolerating trach collar currently Try to go greater than 24 hours on trach collar  Acute systolic heart failure - Follow-up echocardiogram with improvement of ejection fraction. At this point no plans for cardiac catheterization per cardiology Continue Coreg Cozaar Lipitor Outpatient follow-up with cardiology  Nutrition: status post PEG tube by IR. Tube feedings at goal  Acute metabolic encephalopathy. Continue Seroquel clonazepam  DM2. Sliding scale insulin protocol  Deconditioning PT and OT as tolerated LTAC when available  Sacral decub Wound care is following  Best practice:  Diet: tube feeding Pain/Anxiety/Delirium protocol (if indicated): as above, RASS goal 0 VAP protocol (if indicated): yes DVT prophylaxis: lovenox GI prophylaxis: famotidine Glucose control: SSI/Levemir Mobility: Continue PT efforts Code Status: Full Family Communication: 01/03/2019 no family at bedside Disposition: Doesn't have LTAC benefits, continue weaning efforts in hopes we can get him off ventilatory support and transition to ATC.   Richardson Landry Minor ACNP [facility] PCCM Pager [phone] till 1 pm If no answer page [phone] 01/03/2019, 10:08 AM  Attending Note:  63 year old male with prolonged respiratory failure due to influenza pneumonia who presents to [facility] for respiratory failure.  Patient was placed on the vent overnight and seems to be doing well this AM on exam.  I reviewed CXR myself, trach is in a good position.  Discussed with PCCM-NP.    Respiratory failure:  - Maintain on TC as tolerated at this point, no need for vent at night  Dysphagia:  - SLP  Hypoxemia:  - Titrate O2 for sat of 88-92%  Trach status:  - Maintain current trach size and type, if able to stay off the vent then will consider downsizing to a cuffless 4 but maintain for now.  PCCM will  continue to manage  Transfer to SDU and to [facility] service with PCCM following for trach/vent management  Patient seen and examined, agree with above note.  I dictated the care and orders written for this patient under my direction.  Rush Farmer, [place]

## 2019-01-03 NOTE — Progress Notes (Signed)
Pt placed on ATC.  No complications noted. Pt to do ATC as tolerated.

## 2019-01-03 NOTE — Progress Notes (Signed)
NAME:  Dakai Braithwaite., MRN:  [id number], DOB:  [birth year], LOS: 44 ADMISSION DATE:  11/28/2018, CONSULTATION DATE:  1/3 REFERRING MD:  EDP, CHIEF COMPLAINT:  Dyspnea   Brief History   63 year old male with influenza A causing respiratory failure and cardiogenic shock. Prolonged mechanical ventilation, tracheostomy placed on January 16.  Past Medical History  DM2, CAD, HTN  Significant Hospital Events   1/6 self extubated 1/7 re-intubated 1/8 self extubated 1/9 acute respiratory failure, mucus plugging right lower lobe, re-intubated, bronchoscopy 1/10 New fever T Max 103.1, antibiotics resumed with ceftaz and vancomycin 1/11: Hemodynamically seems to be improving. Very hypernatremic. X-ray improving. Adding free water, one-time Lasix, follow-up chemistry a.m. Hoping to initiate weaning efforts on 1/12 1/16. Continuing diuresis and ongoing ventilatory support. Working on weaning but still has significant work of breathing. Plan has been to proceed with tracheostomy. Hopefully we can continue ongoing diuresis and continue to decrease sedation 1/20 still struggling to wean. Secretions and anxiety are barriers.  1/21 trach revision to 6 distal XLT, fever, increased secretions, abx broadened / bronch 1/22 ongoing thick secretions 1/24 Failed SBT, low grade fever, Per CXR persistent pleural effusions / basilar consolidation atelectasis vs pneumonia per bases 1/25>> Mucus plugged with slow recovery after chest PT and Wound Care 2/3 > first time weaned for greater than 15 minutes (previously failed due to agitation, secretions, tachypnea)  Consults:  PCCM Cardiology   Procedures:  ETT 1/3 >>1/6, 1/7 > 1/8> 1/9>> 1/16 1/5 Right TL PICC >> 2/4 1/9 Bronch>> Mucus plugging RLL Trach 1/16 (JY) >>1/20 trach change failed and #4 cuffed 1/21 trach upsized to 6 cuffed XLT distal  1/17 right nare cortrak >> 1/29 1/28 gastrostomy tube >   Significant Diagnostic Tests:  UDS  1/4 >>negative  ECHO 1/4 >>LVEF 20-25%, akinesis of the anteroseptal, anterolateral & apical myocardium, LVEF 40-34%, grade 1 diastolic dysfunction, small pericardial effusion without evidence of hemodynamic compromise. CT Chest / ABD / Pelvis 1/3 >> airway thickening, motion artifact, CAD Echo 1/29>> LVEF 60-65%, left ventricular hypertrophy, trivial pericardial effusion  Micro Data:  RVP 1/3 >> positive for influenza A BCx2 1/3 >>No growth UC 1/4 >>No Growth Sputum Cx 1/7 >> Normal respiratory flora BAL >>corynebacterium  Blood Cx 1/10 > neg Sputum 1/21 > abundant corynebacterium striatum  Antimicrobials:  Vanco 1/3 x1;    Zosyn 1/30 x1  Tamiflu 1/3 >1/8 1/10 vancomycin>1/12 1/10 ceftaz >1/12;   1/21 >>1/22 1/12 emycin > 1/22 1/23 cefepime >> 1/24 1/21 Vanc> 1/28  Interim history/subjective:  Some agitation overnight. No complaints this morning. Tolerating 5/5 wean.   Objective   Blood pressure 121/69, pulse 90, temperature 99.3 F (37.4 C), temperature source Oral, resp. rate (!) 22, height 6' (1.829 m), weight 90.1 kg, SpO2 95 %.    Vent Mode: PRVC FiO2 (%):  [40 %] 40 % Set Rate:  [22 bmp] 22 bmp Vt Set:  [620 mL] 620 mL PEEP:  [5 cmH20] 5 cmH20 Plateau Pressure:  [21 cmH20-25 cmH20] 23 cmH20   Intake/Output Summary (Last 24 hours) at 01/03/2019 1006 Last data filed at 01/03/2019 0600 Gross per 24 hour  Intake 385 ml  Output 800 ml  Net -415 ml   Filed Weights   01/01/19 0500 01/02/19 0500 01/03/19 0500  Weight: 89.8 kg 91 kg 90.1 kg    Examination: General: Hyperinflated chest, male who is awake and follows commands HEENT: Trachea with thick secretions Neuro: Moves extremities. CV: Heart sounds are distant PULM: even/non-labored, lungs  bilaterally decreased air movement bilaterally OP:FYTW, non-tender, bsx4 active  Extremities: Left BKA is noted, right foot with him be hemi-metatarsal removal Skin: no rashes or lesions    Resolved Hospital Problem  list   AKI Pneumonia Hypokalemia Hypernatremia  Assessment & Plan:   Acute respiratory failure with hypoxemia Prolonged respiratory failure after flu a infection, Corynebacterium pneumonia. Currently on 5/5 with tidal volumes 500 cc range but with his severe COPD he may not be able to go on trach collar today Continue pulmonary toilet  Acute systolic heart failure - Follow-up echocardiogram with improvement of ejection fraction. At this point no plans for cardiac catheterization per cardiology Continue Coreg Cozaar Lipitor Outpatient cardiology follow-up  Nutrition: status post PEG tube by IR. Tube feedings at goal  Acute metabolic encephalopathy. Continue Seroquel clonazepam  DM2. Sliding scale insulin protocol  Deconditioning PT and OT as tolerated LTAC when available  Sacral decub Wound care is following  Best practice:  Diet: tube feeding Pain/Anxiety/Delirium protocol (if indicated): as above, RASS goal 0 VAP protocol (if indicated): yes DVT prophylaxis: lovenox GI prophylaxis: famotidine Glucose control: SSI/Levemir Mobility: Continue PT efforts Code Status: Full Family Communication: 01/02/2019 no family at bedside Disposition: Doesn't have LTAC benefits, continue weaning efforts in hopes we can get him off ventilatory support and transition to ATC.   Richardson Landry Tsion Inghram ACNP [facility] PCCM Pager [phone] till 1 pm If no answer page [phone] 01/03/2019, 10:06 AM

## 2019-01-04 DIAGNOSIS — R509 Fever, unspecified: Secondary | ICD-10-CM

## 2019-01-04 DIAGNOSIS — R000 Tachycardia, unspecified: Secondary | ICD-10-CM

## 2019-01-04 DIAGNOSIS — R0902 Hypoxemia: Secondary | ICD-10-CM

## 2019-01-04 DIAGNOSIS — J9620 Acute and chronic respiratory failure, unspecified whether with hypoxia or hypercapnia: Secondary | ICD-10-CM

## 2019-01-04 LAB — CBC
HCT: 28.9 % — ABNORMAL LOW (ref 39.0–52.0)
Hemoglobin: 8.8 g/dL — ABNORMAL LOW (ref 13.0–17.0)
MCH: 26.5 pg (ref 26.0–34.0)
MCHC: 30.4 g/dL (ref 30.0–36.0)
MCV: 87 fL (ref 80.0–100.0)
Platelets: 287 10*3/uL (ref 150–400)
RBC: 3.32 MIL/uL — ABNORMAL LOW (ref 4.22–5.81)
RDW: 14 % (ref 11.5–15.5)
WBC: 6.7 10*3/uL (ref 4.0–10.5)
nRBC: 0 % (ref 0.0–0.2)

## 2019-01-04 LAB — GLUCOSE, CAPILLARY
GLUCOSE-CAPILLARY: 121 mg/dL — AB (ref 70–99)
Glucose-Capillary: 235 mg/dL — ABNORMAL HIGH (ref 70–99)
Glucose-Capillary: 203 mg/dL — ABNORMAL HIGH (ref 70–99)
Glucose-Capillary: 200 mg/dL — ABNORMAL HIGH (ref 70–99)
Glucose-Capillary: 127 mg/dL — ABNORMAL HIGH (ref 70–99)
Glucose-Capillary: 131 mg/dL — ABNORMAL HIGH (ref 70–99)

## 2019-01-04 LAB — BASIC METABOLIC PANEL
Anion gap: 10 (ref 5–15)
BUN: 34 mg/dL — ABNORMAL HIGH (ref 6–20)
CO2: 29 mmol/L (ref 22–32)
CREATININE: 0.63 mg/dL (ref 0.61–1.24)
Calcium: 8.9 mg/dL (ref 8.9–10.3)
Chloride: 100 mmol/L (ref 98–111)
GFR calc Af Amer: >60 mL/min (ref >60)
GFR calc non Af Amer: >60 mL/min (ref >60)
Glucose, Bld: 252 mg/dL — ABNORMAL HIGH (ref 70–99)
Potassium: 4.6 mmol/L (ref 3.5–5.1)
Sodium: 139 mmol/L (ref 135–145)

## 2019-01-04 LAB — PHOSPHORUS: PHOSPHORUS: 5.4 mg/dL — AB (ref 2.5–4.6)

## 2019-01-04 LAB — MRSA PCR SCREENING: MRSA by PCR: NEGATIVE

## 2019-01-04 LAB — MAGNESIUM: Magnesium: 2.1 mg/dL (ref 1.7–2.4)

## 2019-01-04 NOTE — Progress Notes (Addendum)
NAME:  Antonio [name]., MRN:  [id number], DOB:  12-03-1955, LOS: 14 ADMISSION DATE:  11/28/2018, CONSULTATION DATE:  1/3 REFERRING MD:  EDP, CHIEF COMPLAINT:  Dyspnea   Brief History   63 year old male with influenza A causing respiratory failure and cardiogenic shock. Prolonged mechanical ventilation, tracheostomy placed on January 16.  Past Medical History  DM2, CAD, HTN  Significant Hospital Events   1/6 self extubated 1/7 re-intubated 1/8 self extubated 1/9 acute respiratory failure, mucus plugging right lower lobe, re-intubated, bronchoscopy 1/10 New fever T Max 103.1, antibiotics resumed with ceftaz and vancomycin 1/11: Hemodynamically seems to be improving. Very hypernatremic. X-ray improving. Adding free water, one-time Lasix, follow-up chemistry a.m. Hoping to initiate weaning efforts on 1/12 1/16. Continuing diuresis and ongoing ventilatory support. Working on weaning but still has significant work of breathing. Plan has been to proceed with tracheostomy. Hopefully we can continue ongoing diuresis and continue to decrease sedation 1/20 still struggling to wean. Secretions and anxiety are barriers.  1/21 trach revision to 6 distal XLT, fever, increased secretions, abx broadened / bronch 1/22 ongoing thick secretions 1/24 Failed SBT, low grade fever, Per CXR persistent pleural effusions / basilar consolidation atelectasis vs pneumonia per bases 1/25>> Mucus plugged with slow recovery after chest PT and Wound Care 2/3 > first time weaned for greater than 15 minutes (previously failed due to agitation, secretions, tachypnea)  Consults:  PCCM Cardiology   Procedures:  ETT 1/3 >>1/6, 1/7 > 1/8> 1/9>> 1/16 1/5 Right TL PICC >> 2/4 1/9 Bronch>> Mucus plugging RLL Trach 1/16 (JY) >>1/20 trach change failed and #4 cuffed 1/21 trach upsized to 6 cuffed XLT distal  1/17 right nare cortrak >> 1/29 1/28 gastrostomy tube >   Significant Diagnostic Tests:  UDS  1/4 >>negative  ECHO 1/4 >>LVEF 20-25%, akinesis of the anteroseptal, anterolateral & apical myocardium, LVEF 66-29%, grade 1 diastolic dysfunction, small pericardial effusion without evidence of hemodynamic compromise. CT Chest / ABD / Pelvis 1/3 >> airway thickening, motion artifact, CAD Echo 1/29>> LVEF 60-65%, left ventricular hypertrophy, trivial pericardial effusion  Micro Data:  RVP 1/3 >> positive for influenza A BCx2 1/3 >>No growth UC 1/4 >>No Growth Sputum Cx 1/7 >> Normal respiratory flora BAL >>corynebacterium  Blood Cx 1/10 > neg Sputum 1/21 > abundant corynebacterium striatum  Antimicrobials:  Vanco 1/3 x1;    Zosyn 1/30 x1  Tamiflu 1/3 >1/8 1/10 vancomycin>1/12 1/10 ceftaz >1/12;   1/21 >>1/22 1/12 emycin > 1/22 1/23 cefepime >> 1/24 1/21 Vanc> 1/28  Interim history/subjective:  Tolerating tube collar greater than 24 hours.  Pulmonary critical care will follow twice a week for trach management Objective   Blood pressure 126/61, pulse 90, temperature 98.9 F (37.2 C), temperature source Axillary, resp. rate (!) 24, height 6' (1.829 m), weight 89.3 kg, SpO2 100 %.    Vent Mode: Stand-by FiO2 (%):  [40 %] 40 % Set Rate:  [22 bmp] 22 bmp Vt Set:  [620 mL] 620 mL PEEP:  [5 cmH20] 5 cmH20 Plateau Pressure:  [23 cmH20] 23 cmH20   Intake/Output Summary (Last 24 hours) at 01/04/2019 0809 Last data filed at 01/04/2019 0600 Gross per 24 hour  Intake 440 ml  Output 850 ml  Net -410 ml   Filed Weights   01/02/19 0500 01/03/19 0500 01/04/19 0444  Weight: 91 kg 90.1 kg 89.3 kg    Examination: General: Male appears older than stated age. HEENT: The ostomy in place and unremarkable Neuro: Essentially intact with some right-sided  weakness CV: s1s2 rrr, no m/r/g PULM: even/non-labored, lungs bilaterally diminished PQ:DIYM, non-tender, bsx4 active, PEG in place for tube feedings Extremities: Left BKA, right foot partially amputated Skin: no rashes or  lesions      Resolved Hospital Problem list   AKI Pneumonia Hypokalemia Hypernatremia  Assessment & Plan:   Acute respiratory failure with hypoxemia Prolonged respiratory failure after flu a infection, Corynebacterium pneumonia. Currently to day 2 of trach collar Remove the ventilator out of the room next 24 hours Transfer to stepdown unit Try it is assumed his care pulmonary critical care will follow for trach management  Acute systolic heart failure - Follow-up echocardiogram with improvement of ejection fraction. At this point no plans for cardiac catheterization per cardiology New Coreg and Cozaar Lipitor Eventual follow-up with cardiology as an outpatient  Nutrition: status post PEG tube by IR. Tube feeds are at goal via PEG  Acute metabolic encephalopathy. Continue Seroquel and lorazepam  DM2. On scale insulin protocol  Deconditioning PT and OT as tolerated Needs LTAC care when available  Sacral decub Per wound care  Best practice:  Diet: tube feeding Pain/Anxiety/Delirium protocol (if indicated): as above, RASS goal 0 VAP protocol (if indicated): yes DVT prophylaxis: lovenox GI prophylaxis: famotidine Glucose control: SSI/Levemir Mobility: Continue PT efforts Code Status: Full Family Communication: 01/03/2019 no family at bedside Disposition: Doesn't have LTAC benefits, continue weaning efforts in hopes we can get him off ventilatory support and transition to ATC.   Richardson Landry Minor ACNP [facility] PCCM Pager [phone] till 1 pm If no answer page [phone] 01/04/2019, 8:09 AM  Attending Note:  63 year old male with influenza A induced respiratory failure who required tracheostomy.  Patient successfully stayed of the ventilator overnight and appears comfortable this AM on exam with clear lungs.  I reviewed CXR myself, trach is in a good position.  Discussed with PCCM-NP.  Acute respiratory failure:  - Maintain off the vent as tolerated at this  time  Hypoxemia:  - Titrate O2 for sat of 88-92%  Trach status:  - If remains off the vent over the next 2 night then will change to a cuffless 4 on Tuesday  Pulmonary edema:  - Continue active diureses  - Strict I/O  - Replace electrolytes  PCCM will continue to follow for trach/vent management  Patient seen and examined, agree with above note.  I dictated the care and orders written for this patient under my direction.  Rush Farmer, M.D. [facility] Pulmonary/Critical Care Medicine. Pager: [phone]. After hours pager: [phone]

## 2019-01-04 NOTE — Progress Notes (Signed)
PROGRESS NOTE    Antonio [name].  BJY:[id number] DOB: 11/02/1956 DOA: 11/28/2018 PCP: Imagene Riches, NP  Brief Narrative: This patient was admitted 11/28/2018.  Transferred to [facility] 01/04/2019.  63 year old male with acute respiratory failure and cardiogenic shock secondary to influenza A requiring prolonged mechanical ventilation and tracheostomy which was done on December 11, 2018.  Patient has history of CAD hypertension type 2 diabetes.  He was intubated couple of times as patient self extubated.  Treated with ceftazidime and vancomycin.  Echocardiogram 11/29/2018 ejection fraction 20 to 25% akinesis of the anteroseptal anterolateral apical myocardium.  Grade 1 diastolic dysfunction and small pericardial effusion without evidence of hemodynamic compromise.  Echo 12/24/2018 ejection fraction 60 to 65%.  His respiratory virus panel was positive for influenza A blood culture urine culture with no growth sputum culture Corynebacterium striatum.  CT of the chest abdomen and pelvis shows airway thickening.  He was treated with vancomycin and Zosyn Tamiflu ceftazidime and cefepime during his hospital stay.  He is currently on tube feeds.    He is awake and alert but very hard of hearing but answers all my questions appropriately and nods his head.  Assessment & Plan:   Active Problems:   Chest pain   SIRS (systemic inflammatory response syndrome) (HCC)   Acute respiratory failure with hypoxemia (HCC)   Pressure injury of skin   Influenza A virus present   Pneumonia   Tracheostomy status ([place])   Acute pulmonary edema (HCC)   Hypoxia  #1 status post acute hypoxic respiratory failure secondary to influenza A and Corynebacterium pneumonia both treated.  Patient is off the vent at this time.  He is status post trach 11 December 2018.  Continue respiratory treatments.  PT consult and speech consult.  PT recommends LTAC.  Seroquel was started for agitation try to taper as much as tolerable.  #2 acute  systolic heart failure at the time of admission echo repeated 129 with improvement and normalization of ejection fraction.  Cardiology at this time not planning for any further test.  Continue Coreg Lipitor and Cozaar.  Follow-up with cardiology as an outpatient.  #3 type 2 diabetes continue SSI and Levemir twice a day.  #4 status post PEG tube by interventional radiology on tube feeds tube feeds at goal.  #5 sacral decubitus continue wound care PT following.  #6 hypertension continue Cozaar carvedilol  #7 hyperlipidemia continue Lipitor   Pressure Injury 12/01/18 Unstageable - Full thickness tissue loss in which the base of the ulcer is covered by slough (yellow, tan, gray, green or brown) and/or eschar (tan, brown or black) in the wound bed. sacrum (Active)  12/01/18 0400  Location: Sacrum  Location Orientation: Posterior  Staging: Unstageable - Full thickness tissue loss in which the base of the ulcer is covered by slough (yellow, tan, gray, green or brown) and/or eschar (tan, brown or black) in the wound bed.  Wound Description (Comments): sacrum  Present on Admission:      Pressure Injury 12/22/18 Stage I -  Intact skin with non-blanchable redness of a localized area usually over a bony prominence. Quarter size hairless wound  (Active)  12/22/18 1756  Location: Head  Location Orientation: Left;Posterior  Staging: Stage I -  Intact skin with non-blanchable redness of a localized area usually over a bony prominence.  Wound Description (Comments): Quarter size hairless wound   Present on Admission: No      Nutrition Problem: Inadequate oral intake Etiology: inability to eat  Signs/Symptoms: NPO status    Interventions: Refer to RD note for recommendations  Estimated body mass index is 26.7 kg/m as calculated from the following:   Height as of this encounter: 6' (1.829 m).   Weight as of this encounter: 89.3 kg.  DVT prophylaxis Lovenox Code Status: Full  code Family Communication: None available Disposition Plan. Pending clinical improvement  Consultants: PCCM and cardiology  Procedures: Trach and PEG Antimicrobials none  Subjective: Resting in bed awake alert trach in place getting a breathing treatment appears in no acute distress  Objective: Vitals:   01/04/19 0600 01/04/19 0700 01/04/19 0733 01/04/19 0800  BP: 129/71 126/61    Pulse: 91 88 90   Resp: (!) 21 (!) 30 (!) 24   Temp:    98.7 F (37.1 C)  TempSrc:    Oral  SpO2: 100% 100% 100%   Weight:      Height:        Intake/Output Summary (Last 24 hours) at 01/04/2019 0944 Last data filed at 01/04/2019 0600 Gross per 24 hour  Intake 385 ml  Output 850 ml  Net -465 ml   Filed Weights   01/02/19 0500 01/03/19 0500 01/04/19 0444  Weight: 91 kg 90.1 kg 89.3 kg    Examination:  General exam: Appears calm and comfortable  Respiratory system: Decreased breath sounds at the bases to auscultation. Respiratory effort normal. Cardiovascular system: S1 & S2 heard, RRR. No JVD, murmurs, rubs, gallops or clicks. No pedal edema. Gastrointestinal system: Abdomen is nondistended, soft and nontender. No organomegaly or masses felt. Normal bowel sounds heard. 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 Mood & affect appropriate.     Data Reviewed: I have personally reviewed following labs and imaging studies  CBC: Recent Labs  Lab 12/30/18 0502 01/01/19 0419 01/02/19 0616 01/04/19 0314  WBC 7.8 6.8 6.7 6.7  HGB 9.0* 8.2* 9.0* 8.8*  HCT 28.6* 24.7* 27.9* 28.9*  MCV 86.9 85.2 85.3 87.0  PLT 280 258 293 335   Basic Metabolic Panel: Recent Labs  Lab 12/30/18 0502 01/01/19 0419 01/02/19 0616 01/03/19 0550 01/04/19 0314  NA 136 133* 136 138 139  K 4.0 4.1 4.6 4.2 4.6  CL 101 99 97* 101  100  CO2 _0 GLUCOSE 231* 290* 209* 141* 252*  BUN 32* 37* 35* 41* 34*  CREATININE 0.73 0.72 0.72 0.69 0.63  CALCIUM 8.6* 8.4* 8.8* 8.7* 8.9  MG 1.9 1.8 1.9 2.2 2.1  PHOS 4.4 4.8* 4.1 3.9 5.4*   GFR: Estimated Creatinine Clearance: 107.8 mL/min (by C-G formula based on SCr of 0.63 mg/dL). Liver Function Tests: No results for input(s): AST, ALT, ALKPHOS, BILITOT, PROT, ALBUMIN in the last 168 hours. No results for input(s): LIPASE, AMYLASE in the last 168 hours. No results for input(s): AMMONIA in the last 168 hours. Coagulation Profile: No results for input(s): INR, PROTIME in the last 168 hours. Cardiac Enzymes: No results for input(s): CKTOTAL, CKMB, CKMBINDEX, TROPONINI in the last 168 hours. BNP (last 3 results) No results for input(s): PROBNP in the last 8760 hours. HbA1C: No results for input(s): HGBA1C in the last 72 hours. CBG: Recent Labs  Lab 01/03/19 1605 01/03/19 2017 01/03/19 2328 01/04/19 0328 01/04/19 0833  GLUCAP 91 129* 155* 235* 203*   Lipid Profile: No results for input(s): CHOL, HDL,  LDLCALC, TRIG, CHOLHDL, LDLDIRECT in the last 72 hours. Thyroid Function Tests: No results for input(s): TSH, T4TOTAL, FREET4, T3FREE, THYROIDAB in the last 72 hours. Anemia Panel: No results for input(s): VITAMINB12, FOLATE, FERRITIN, TIBC, IRON, RETICCTPCT in the last 72 hours. Sepsis Labs: No results for input(s): PROCALCITON, LATICACIDVEN in the last 168 hours.  No results found for this or any previous visit (from the past 240 hour(s)).       Radiology Studies: Dg Chest Port 1 View  Result Date: 01/03/2019 CLINICAL DATA:  Respiratory failure EXAM: PORTABLE CHEST 1 VIEW COMPARISON:  Five days ago FINDINGS: Tracheostomy tube in place. Right upper extremity PICC has been removed. There is artifact from EKG leads. Low volume chest with continued haziness at the right more than left base. No Kerley lines, effusion, or pneumothorax. IMPRESSION: Low volume  chest with atelectasis or consolidation at the bases, unchanged from 5 days ago. Electronically Signed   By: Monte Fantasia M.D.   On: 01/03/2019 09:09        Scheduled Meds: . acetylcysteine  4 mL Nebulization Q4H  . aspirin  81 mg Per Tube Daily  . atorvastatin  40 mg Oral q1800  . carvedilol  6.25 mg Oral BID WC  . chlorhexidine gluconate (MEDLINE KIT)  15 mL Mouth Rinse BID  . clonazePAM  0.5 mg Per Tube BID  . enoxaparin (LOVENOX) injection  40 mg Subcutaneous Daily  . famotidine  20 mg Per Tube BID  . feeding supplement (PRO-STAT SUGAR FREE 64)  60 mL Per Tube BID  . fentaNYL  75 mcg Transdermal Q72H  . folic acid  1 mg Per Tube Daily  . guaiFENesin  5 mL Per Tube Q12H  . insulin aspart  0-20 Units Subcutaneous Q4H  . insulin detemir  50 Units Subcutaneous BID  . ipratropium-albuterol  3 mL Nebulization Q4H  . losartan  25 mg Oral Daily  . mouth rinse  15 mL Mouth Rinse QID  . multivitamin  15 mL Oral Daily  . nutrition supplement (JUVEN)  1 packet Per Tube BID BM  . QUEtiapine  200 mg Per Tube BID  . thiamine  100 mg Per Tube Daily   Continuous Infusions: . sodium chloride Stopped (12/30/18 0849)  . feeding supplement (VITAL 1.5 CAL) 1,000 mL (01/03/19 1644)     LOS: 100 days     Georgette Shell, MD [facility]  If 7PM-7AM, please contact night-coverage [email or web address] Password [facility] 01/04/2019, 9:44 AM

## 2019-01-05 LAB — GLUCOSE, CAPILLARY
Glucose-Capillary: 137 mg/dL — ABNORMAL HIGH (ref 70–99)
Glucose-Capillary: 164 mg/dL — ABNORMAL HIGH (ref 70–99)
Glucose-Capillary: 188 mg/dL — ABNORMAL HIGH (ref 70–99)
Glucose-Capillary: 257 mg/dL — ABNORMAL HIGH (ref 70–99)
Glucose-Capillary: 85 mg/dL (ref 70–99)

## 2019-01-05 MED ORDER — LOSARTAN POTASSIUM 25 MG PO TABS
25.0000 mg | ORAL_TABLET | Freq: Every day | ORAL | Status: DC
  Administered 2019-01-06 – 2019-01-12 (×7): 25 mg
  Filled 2019-01-05 (×8): qty 1

## 2019-01-05 MED ORDER — FUROSEMIDE 10 MG/ML IJ SOLN
40.0000 mg | Freq: Once | INTRAMUSCULAR | Status: AC
  Administered 2019-01-05: 40 mg via INTRAVENOUS
  Filled 2019-01-05: qty 4

## 2019-01-05 MED ORDER — ENOXAPARIN SODIUM 40 MG/0.4ML ~~LOC~~ SOLN
40.0000 mg | SUBCUTANEOUS | Status: DC
  Administered 2019-01-05 – 2019-02-10 (×37): 40 mg via SUBCUTANEOUS
  Filled 2019-01-05 (×45): qty 0.4

## 2019-01-05 MED ORDER — ADULT MULTIVITAMIN LIQUID CH
15.0000 mL | Freq: Every day | ORAL | Status: DC
  Administered 2019-01-06 – 2019-02-12 (×38): 15 mL
  Filled 2019-01-05 (×39): qty 15

## 2019-01-05 MED ORDER — CARVEDILOL 6.25 MG PO TABS
6.2500 mg | ORAL_TABLET | Freq: Two times a day (BID) | ORAL | Status: DC
  Administered 2019-01-05 – 2019-01-11 (×11): 6.25 mg
  Filled 2019-01-05 (×2): qty 1
  Filled 2019-01-05: qty 2
  Filled 2019-01-05 (×6): qty 1
  Filled 2019-01-05: qty 2
  Filled 2019-01-05 (×3): qty 1

## 2019-01-05 MED ORDER — ACETAMINOPHEN 160 MG/5ML PO SOLN
650.0000 mg | Freq: Four times a day (QID) | ORAL | Status: DC | PRN
  Administered 2019-01-10 – 2019-02-18 (×6): 650 mg
  Filled 2019-01-05 (×9): qty 20.3

## 2019-01-05 MED ORDER — ATORVASTATIN CALCIUM 40 MG PO TABS
40.0000 mg | ORAL_TABLET | Freq: Every day | ORAL | Status: DC
  Administered 2019-01-05 – 2019-02-20 (×47): 40 mg
  Filled 2019-01-05 (×47): qty 1

## 2019-01-05 MED FILL — Fentanyl TD Patch 72HR 75 MCG/HR: TRANSDERMAL | Qty: 1 | Status: AC

## 2019-01-05 NOTE — Clinical Social Work Note (Signed)
Clinical Social Worker continuing to follow patient and family for support and discharge planning needs.  Patient has transitioned to trach collar at 40%.  Per RN, patient continues to wean well, therefore CSW awaiting patient ability to maintain at 28% trach collar.  CSW to initiate local bed search once patient tolerating trach collar at 28%.  CSW remains available for support and to facilitate patient discharge needs.  Macario Golds, [place] [phone]

## 2019-01-05 NOTE — Progress Notes (Signed)
Nutrition Follow-up  DOCUMENTATION CODES:   Not applicable  INTERVENTION:   Continue:  Vital 1.5 @ 55 ml/hr (1320 ml) via G-tube 60 ml Prostat BID Juven BID MVI daily  Provides: 2560 kcal, 154 grams protein, and 1003 ml free water.    NUTRITION DIAGNOSIS:   Inadequate oral intake related to inability to eat as evidenced by NPO status. Ongoing  GOAL:   Provide needs based on ASPEN/SCCM guidelines Met   MONITOR:   Vent status, Labs, Skin, Weight trends, I & O's  ASSESSMENT:   63 y/o M who presented to [facility] 1/3 with fever, cough, chest pain & SOB. Pt found to be Influenza A positive. Developed progressive respiratory distress requiring intubation in the ER.     1/15 Cortrak placed (tip post-pyloric) 1/16 trach 1/28- Cortrak removed, g-tube placed by IR  Pt discussed during ICU rounds and with RN.  Pt now on trach collar x 48 hrs Hydrotherapy continues, may d/c end of this week  Medications reviewed and include: folic acid, SSI novolog, Levemir, liquid MVI, thiamine Labs reviewed: CBG 121-235 +10 L   Diet Order:   Diet Order    None      EDUCATION NEEDS:   Not appropriate for education at this time  Skin:  Skin Assessment: Skin Integrity Issues: Skin Integrity Issues:: Stage II, Unstageable, Stage I Stage I: head Stage II: sacrum now unstageable  Unstageable: sacrum   Last BM:  2/8 small  Height:   Ht Readings from Last 1 Encounters:  11/29/18 6' (1.829 m)    Weight:   Wt Readings from Last 1 Encounters:  01/05/19 88.5 kg    Ideal Body Weight:  76.1 kg(adjusted for L BKA)  BMI:  Body mass index is 26.46 kg/m.  Estimated Nutritional Needs:   Kcal:  2300-2500  Protein:  120-150 grams  Fluid:  > 2 L/day   Maylon Peppers RD, LDN, CNSC [phone] Pager [phone] After Hours Pager

## 2019-01-05 NOTE — Progress Notes (Signed)
  Speech Language Pathology Treatment: Antonio [name] Speaking valve  Patient Details Name: Antonio [name]. MRN: [id number] DOB: 06/21/1956 Today's Date: 01/05/2019 Time: 2023-3435 SLP Time Calculation (min) (ACUTE ONLY): 15 min  Assessment / Plan / Recommendation Clinical Impression  Pt alert, parents in room, pt on trach collar (vent not present in room) and SLP slowly deflated cuff. Secretions are copious,  thick with productive cough to expel most with repetitive cues for deep inhalation and strong cough. Voice barely audible, wet/congested but louder with cues increase intensity. HR and SpO2 within normal range. RR lead not reading however no increased work of breathing. Suspect reduced diaphragmatic function due to significant deconditioning which should improve as endurance increases. Pt requested water several times. Given volume of secretions and decreased energy reserve, he is not ready for instrumental assessment yet but moving steadily in that direction. Provided pt with damp toothette. Cuff remains deflated- hopefully pt will be able to have cuffless trach in near future (?).   HPI HPI: 63 year old male coming in with fever cough chest pain and shortness of breath was found to have influenza A. Intubated 1/3-1/8. PMH: diabetes mellitus, hypertension. CXR 1/7 Low volume chest with interstitial coarsening and streaky basilar density. No effusion or pneumothorax.      SLP Plan  Continue with current plan of care       Recommendations         Patient may use Passy-Muir Speech Valve: with SLP only PMSV Supervision: Full MD: Please consider changing trach tube to : Cuffless         Oral Care Recommendations: Oral care QID Follow up Recommendations: 24 hour supervision/assistance;Skilled Nursing facility SLP Visit Diagnosis: Aphonia (R49.1) Plan: Continue with current plan of care       GO                Antonio [name] 01/05/2019, 11:40 AM  Antonio [name]  Antonio [name].Ed Nurse, children's [phone] Office [phone]

## 2019-01-05 NOTE — Progress Notes (Addendum)
NAME:  Antonio [name]., MRN:  [id number], DOB:  August 10, 1956, LOS: 7 ADMISSION DATE:  11/28/2018, CONSULTATION DATE:  1/3 REFERRING MD:  EDP, CHIEF COMPLAINT:  Dyspnea   Brief History   63 year old male with influenza A causing respiratory failure and cardiogenic shock. Prolonged mechanical ventilation, tracheostomy placed on January 16.  Past Medical History  DM2, CAD, HTN  Significant Hospital Events   1/6 self extubated 1/7 re-intubated 1/8 self extubated 1/9 acute respiratory failure, mucus plugging right lower lobe, re-intubated, bronchoscopy 1/10 New fever T Max 103.1, antibiotics resumed with ceftaz and vancomycin 1/11: Hemodynamically seems to be improving. Very hypernatremic. X-ray improving. Adding free water, one-time Lasix, follow-up chemistry a.m. Hoping to initiate weaning efforts on 1/12 1/16. Continuing diuresis and ongoing ventilatory support. Working on weaning but still has significant work of breathing. Plan has been to proceed with tracheostomy. Hopefully we can continue ongoing diuresis and continue to decrease sedation 1/20 still struggling to wean. Secretions and anxiety are barriers.  1/21 trach revision to 6 distal XLT, fever, increased secretions, abx broadened / bronch 1/22 ongoing thick secretions 1/24 Failed SBT, low grade fever, Per CXR persistent pleural effusions / basilar consolidation atelectasis vs pneumonia per bases 1/25>> Mucus plugged with slow recovery after chest PT and Wound Care 2/3 > first time weaned for greater than 15 minutes (previously failed due to agitation, secretions, tachypnea)  Consults:  PCCM Cardiology   Procedures:  ETT 1/3 >>1/6, 1/7 > 1/8> 1/9>> 1/16 1/5 Right TL PICC >> 2/4 1/9 Bronch>> Mucus plugging RLL Trach 1/16 (JY) >>1/20 trach change failed and #4 cuffed 1/21 trach upsized to 6 cuffed XLT distal  1/17 right nare cortrak >> 1/29 1/28 gastrostomy tube >   Significant Diagnostic Tests:  UDS  1/4 >>negative  ECHO 1/4 >>LVEF 20-25%, akinesis of the anteroseptal, anterolateral & apical myocardium, LVEF 29-47%, grade 1 diastolic dysfunction, small pericardial effusion without evidence of hemodynamic compromise. CT Chest / ABD / Pelvis 1/3 >> airway thickening, motion artifact, CAD Echo 1/29>> LVEF 60-65%, left ventricular hypertrophy, trivial pericardial effusion  Micro Data:  RVP 1/3 >> positive for influenza A BCx2 1/3 >>No growth UC 1/4 >>No Growth Sputum Cx 1/7 >> Normal respiratory flora BAL >>corynebacterium  Blood Cx 1/10 > neg Sputum 1/21 > abundant corynebacterium striatum  Antimicrobials:  Vanco 1/3 x1;    Zosyn 1/30 x1  Tamiflu 1/3 >1/8 1/10 vancomycin>1/12 1/10 ceftaz >1/12;   1/21 >>1/22 1/12 emycin > 1/22 1/23 cefepime >> 1/24 1/21 Vanc> 1/28  Interim history/subjective:  Afebrile. Tolerated trach collar x 48 hours. Patient restless but awake and following commands  Objective   Blood pressure 90/67, pulse 95, temperature 98.3 F (36.8 C), temperature source Oral, resp. rate (!) 22, height 6' (1.829 m), weight 88.5 kg, SpO2 95 %.    FiO2 (%):  [40 %] 40 %   Intake/Output Summary (Last 24 hours) at 01/05/2019 0918 Last data filed at 01/05/2019 0900 Gross per 24 hour  Intake 1320 ml  Output 400 ml  Net 920 ml   Filed Weights   01/03/19 0500 01/04/19 0444 01/05/19 0500  Weight: 90.1 kg 89.3 kg 88.5 kg    Physical Exam: General: Well-appearing, no acute distress HENT: [place], AT, OP clear, MMM Neck: Trach midline, c/d/i, no surrounding erythema Eyes: EOMI, no scleral icterus Respiratory: Mild anterior rhonchi, no wheezing Cardiovascular: RRR, -M/R/G, no JVD GI: BS+, soft, nontender, PEG in place Extremities:L BKA, partial amputation of R foot, no erythema Neuro: AAO x4,  CNII-XII grossly intact Skin: Intact, no rashes or bruising Psych: Agitated mood, normal affect  Resolved Hospital Problem list    AKI Pneumonia Hypokalemia Hypernatremia  Assessment & Plan:   Acute respiratory failure with hypoxemia Prolonged respiratory failure after flu a infection, Corynebacterium pneumonia. S/p tracheostomy Tolerating trach collar trials x 48h Plan to exchange for cuffless 4 tomorrow if remains stable Continue routine trach care Oxygenation goal 88-92% Airway clearance measures: CPT, Scheduled mucinex, PRN nebulizers Diuresis as tolerated PT/OT/Speech  Transfer to step-down. Pulmonary will follow for trach management  Best practice:  Diet: tube feeding Pain/Anxiety/Delirium protocol (if indicated): N/A VAP protocol (if indicated): yes DVT prophylaxis: lovenox GI prophylaxis: famotidine Glucose control: SSI/Levemir Mobility: Continue PT efforts Code Status: Full Family Communication: Updated patient on plan Disposition: Doesn't have LTAC benefits, continue weaning efforts in hopes we can get him off ventilatory support and transition to ATC.  Consult Care Time devoted to patient care services described in this note is 36 Minutes. This time reflects time of care of this signee Dr. Rodman Pickle. This critical care time does not reflect procedure time, or teaching time or supervisory time of PA/NP/Med student/Med Resident etc but could involve care discussion time.  Rodman Pickle, M.D. [facility] Pulmonary/Critical Care Medicine Pager: [phone] After hours pager: [phone]

## 2019-01-05 NOTE — Progress Notes (Signed)
Physical Therapy Wound Treatment Patient Details  Name: Antonio [name]. MRN: [id number] Date of Birth: May 15, 1956  Today's Date: 01/05/2019 Time: 6734-1937 Time Calculation (min): 31 min  Subjective  Subjective: Pt mouthing asking for water. Patient and Family Stated Goals: Pt on trach collar Date of Onset: (unknown) Prior Treatments: dressing changes  Pain Score:  Premedicated  Wound Assessment  Pressure Injury 12/01/18 Unstageable - Full thickness tissue loss in which the base of the ulcer is covered by slough (yellow, tan, gray, green or brown) and/or eschar (tan, brown or black) in the wound bed. sacrum (Active)  Dressing Type Barrier Film (skin prep);Silver hydrofiber;ABD 01/05/2019  1:32 PM  Dressing Clean;Dry;Intact;Changed 01/05/2019  1:32 PM  Dressing Change Frequency Daily 01/05/2019  1:32 PM  State of Healing Early/partial granulation 01/05/2019  1:32 PM  Site / Wound Assessment Red;Yellow;Pink 01/05/2019  1:32 PM  % Wound base Red or Granulating 75% 01/05/2019  1:32 PM  % Wound base Yellow/Fibrinous Exudate 25% 01/05/2019  1:32 PM  % Wound base Black/Eschar 0% 01/05/2019  1:32 PM  % Wound base Other/Granulation Tissue (Comment) 0% 01/05/2019  1:32 PM  Peri-wound Assessment Erythema (blanchable) 01/05/2019  1:32 PM  Wound Length (cm) 9 cm 12/31/2018 10:28 AM  Wound Width (cm) 7 cm 12/31/2018 10:28 AM  Wound Depth (cm) 0.1 cm 12/31/2018 10:28 AM  Wound Surface Area (cm^2) 63 cm^2 12/31/2018 10:28 AM  Wound Volume (cm^3) 6.3 cm^3 12/31/2018 10:28 AM  Tunneling (cm) 0 12/31/2018 10:28 AM  Margins Unattached edges (unapproximated) 01/05/2019  1:32 PM  Drainage Amount Scant 01/05/2019  1:32 PM  Drainage Description Serous 01/05/2019  1:32 PM  Treatment Debridement (Selective);Hydrotherapy (Pulse lavage) 01/05/2019  1:32 PM      Hydrotherapy Pulsed lavage therapy - wound location: sacrum Pulsed Lavage with Suction (psi): 8 psi Pulsed Lavage with Suction - Normal Saline Used: 1000  mL Pulsed Lavage Tip: Tip with splash shield Selective Debridement Selective Debridement - Location: sacrum Selective Debridement - Tools Used: Forceps;Scissors Selective Debridement - Tissue Removed: minimal yellow necrotic tissue   Wound Assessment and Plan  Wound Therapy - Assess/Plan/Recommendations Wound Therapy - Clinical Statement: Wound continues to improve. Drainage serous and necrotic tissue decreasing. Expect will be able to dc hydrotherapy by the end of the week. Wound Therapy - Functional Problem List: decr mobility Factors Delaying/Impairing Wound Healing: Diabetes Mellitus;Immobility;Multiple medical problems Hydrotherapy Plan: Debridement;Dressing change;Patient/family education;Pulsatile lavage with suction Wound Therapy - Frequency: 3X / week Wound Therapy - Follow Up Recommendations: Skilled nursing facility Wound Plan: see above  Wound Therapy Goals- Improve the function of patient's integumentary system by progressing the wound(s) through the phases of wound healing (inflammation - proliferation - remodeling) by: Decrease Necrotic Tissue to: 20 Decrease Necrotic Tissue - Progress: Updated due to goal met Increase Granulation Tissue to: 80 Increase Granulation Tissue - Progress: Updated due to goal met Goals/treatment plan/discharge plan were made with and agreed upon by patient/family: Yes Time For Goal Achievement: 7 days Wound Therapy - Potential for Goals: Fair  Goals will be updated until maximal potential achieved or discharge criteria met.  Discharge criteria: when goals achieved, discharge from hospital, MD decision/surgical intervention, no progress towards goals, refusal/missing three consecutive treatments without notification or medical reason.  GP     Shary Decamp [place] 01/05/2019, 2:26 PM Marlana Mckowen Roy Pager [phone] Office [phone]

## 2019-01-05 NOTE — Progress Notes (Signed)
Changed patient's trach ties and added Drawtex dressing to protect skin under trach which looks irritated from so many secretions patient has.  RN notified of change of ties and dressing application.  Tolerated well.

## 2019-01-05 NOTE — Progress Notes (Signed)
Physical Therapy Treatment Patient Details Name: Antonio [name]. MRN: [id number] DOB: 1956-08-23 Today's Date: 01/05/2019    History of Present Illness 63 yo admitted with flu A and PNA with acute respiratory failure intubated 1/4, trach 1/16 with course complicated by acute MI, CHF and cardiogenic shock and mucous plugging. PMhx: DM, HTN, HLD, CAD, Lt BKA, rt toe amputation    PT Comments    Pt on trach collar this session and able to progress to gait for the first time. Pt able to walk 20' with chair follow prior to HR elevating to 140 and SpO2 dropping to 89% on 40% FiO2 trach collar. Pt with assist to don neoprene sleeve and verbal cues for donning prosthesis. Pt seated on geomat end of session and continues to have secretions with mobility. Pt fatigued end of session but excellent progress!   Follow Up Recommendations  SNF;Supervision/Assistance - 24 hour     Equipment Recommendations  Rolling walker with 5" wheels    Recommendations for Other Services       Precautions / Restrictions Precautions Precautions: Fall Precaution Comments: trach, L BKA, PEG    Mobility  Bed Mobility Overal bed mobility: Needs Assistance Bed Mobility: Supine to Sit     Supine to sit: Min assist;HOB elevated     General bed mobility comments: min HHA to elevate trunk from surface with HOB 30 degrees and cues to pivot to EOB  Transfers Overall transfer level: Needs assistance   Transfers: Sit to/from Stand Sit to Stand: Min assist;+2 safety/equipment         General transfer comment: min assist to stand from bed, min +2 to stand from lower recliner with max cues for sequence and hand placement.   Ambulation/Gait Ambulation/Gait assistance: Min assist;+2 safety/equipment Gait Distance (Feet): 20 Feet Assistive device: Rolling walker (2 wheeled) Gait Pattern/deviations: Step-to pattern;Trunk flexed;Wide base of support   Gait velocity interpretation: <1.8 ft/sec, indicate of  risk for recurrent falls General Gait Details: cues for sequence, posture and position in RW with chair follow. Pt walked 8' then 20' with seated rest between. HR up to 140 with gait and SPo2 dropping to 89% with prompting to sit and end gait. PT reports fatigue.   Stairs             Wheelchair Mobility    Modified Rankin (Stroke Patients Only)       Balance Overall balance assessment: Needs assistance Sitting-balance support: No upper extremity supported;Feet supported Sitting balance-Leahy Scale: Fair     Standing balance support: Bilateral upper extremity supported Standing balance-Leahy Scale: Poor Standing balance comment: bil UE supported                            Cognition Arousal/Alertness: Awake/alert Behavior During Therapy: Flat affect Overall Cognitive Status: Impaired/Different from baseline                     Current Attention Level: Selective   Following Commands: Follows one step commands with increased time     Problem Solving: Slow processing General Comments: HOH and increased time and repetition with pt needing cues to don prosthesis EOB. Flat affect      Exercises      General Comments        Pertinent Vitals/Pain Pain Assessment: Faces Pain Score: 6  Faces Pain Scale: Hurts even more Pain Location: butt Pain Intervention(s): Monitored during session    Home Living  Prior Function            PT Goals (current goals can now be found in the care plan section) Acute Rehab PT Goals Time For Goal Achievement: 01/19/19 Potential to Achieve Goals: Good Progress towards PT goals: Goals met and updated - see care plan    Frequency           PT Plan Discharge plan needs to be updated    Co-evaluation              AM-PAC PT "6 Clicks" Mobility   Outcome Measure  Help needed turning from your back to your side while in a flat bed without using bedrails?: A  Little Help needed moving from lying on your back to sitting on the side of a flat bed without using bedrails?: A Little Help needed moving to and from a bed to a chair (including a wheelchair)?: A Little Help needed standing up from a chair using your arms (e.g., wheelchair or bedside chair)?: A Lot Help needed to walk in hospital room?: A Lot Help needed climbing 3-5 steps with a railing? : Total 6 Click Score: 14    End of Session Equipment Utilized During Treatment: Gait belt Activity Tolerance: Patient tolerated treatment well Patient left: with call bell/phone within reach;in bed;with chair alarm set Nurse Communication: Mobility status PT Visit Diagnosis: Other abnormalities of gait and mobility (R26.89);Muscle weakness (generalized) (M62.81);Unsteadiness on feet (R26.81)     Time: 3868-5488 PT Time Calculation (min) (ACUTE ONLY): 30 min  Charges:  $Gait Training: 8-22 mins $Therapeutic Activity: 8-22 mins                     [place], PT Acute Rehabilitation Services Pager: [phone] Office: [place] 01/05/2019, 12:06 PM

## 2019-01-05 NOTE — Progress Notes (Signed)
[place] TEAM [place].  NVB:[id number] DOB: [birth year] DOA: 11/28/2018 PCP: Imagene Riches, NP    Brief Narrative:  63yo male with influenza A causing respiratory failure and cardiogenic shock. Prolonged mechanical ventilation, tracheostomy placed on January 16.  Significant Events:  1/6 self extubated  1/7 re-intubated  1/8 self extubated 1/9 acute respiratory failure, mucus plugging right lower lobe, re-intubated, bronchoscopy 1/10 New fever T Max 103.1, antibiotics resumed with ceftaz and vancomycin 1/11 Hemodynamically seems to be improving. Very hypernatremic. X-ray improving. Adding free water, one-time Lasix, follow-up chemistry a.m. Hoping to initiate weaning efforts on 1/12 1/16 Continuing diuresis and ongoing ventilatory support. Working on weaning but still has significant work of breathing. Plan has been to proceed with tracheostomy. Hopefully we can continue ongoing diuresis and continue to decrease sedation 1/20 still struggling to wean. Secretions and anxiety are barriers.  1/21 trach revision to 6 distal XLT, fever, increased secretions, abx broadened / bronch 1/22 ongoing thick secretions 1/24 Failed SBT, low grade fever, Per CXR persistent pleural effusions / basilar consolidation atelectasis vs pneumonia per bases 1/25 Mucus plugged with slow recovery after chest PT and Wound Care 2/3  first time weaned for greater than 15 minutes (previously failed due to agitation, secretions, tachypnea)  Subjective: Somewhat agitated. Indicates that he is sob. Does not appear to be in acute distress. No family present.   Assessment & Plan:  Acute hypoxic respiratory failure - Influenza A / Corynebacterium pneumonia - Prolonged respiratory failure  Ongoing care of trach per PCCM - appears to be slowly improving   Acute systolic heart failure no plans for cardiac cath per Cardiology - EF 20-25% via TTE 11/29/18, but improved to 60-65% on  f/u 1/29 - likely due to sepsis - appears euvolemic at this time - cont BB and ARB  Nutrition status post PEG tube by IR  Acute metabolic encephalopathy Continue Seroquel and lorazepam - appears slow to improve   DM2 CBG variable - follow w/o change in tx today   Deconditioning Will need long term rehab option once stable for d/c  Sacral decub Per wound care  DVT prophylaxis: lovenox  Code Status: FULL CODE Family Communication: no family present at time of exam  Disposition Plan: SDU  Consultants:  PCCM Cardiology   Antimicrobials:  Vanco 1/3 x1 Zosyn 1/30 x1  Tamiflu 1/3 >1/8 1/10 vancomycin>1/12 1/10 ceftaz >1/12; 1/21 >>1/22 1/12 emycin > 1/22 1/23 cefepime >> 1/24 1/21 Vanc> 1/28  Objective: Blood pressure (!) 90/52, pulse 98, temperature 98.6 F (37 C), temperature source Oral, resp. rate (!) 31, height 6' (1.829 m), weight 88.5 kg, SpO2 94 %.  Intake/Output Summary (Last 24 hours) at 01/05/2019 1434 Last data filed at 01/05/2019 1400 Gross per 24 hour  Intake 1320 ml  Output 950 ml  Net 370 ml   Filed Weights   01/03/19 0500 01/04/19 0444 01/05/19 0500  Weight: 90.1 kg 89.3 kg 88.5 kg    Examination: General: No acute respiratory distress - copious thin secretions via trach  Lungs: Clear to auscultation bilaterally without wheezes or crackles Cardiovascular: Regular rate and rhythm without murmur  Abdomen: Nontender, nondistended, soft, bowel sounds positive, no rebound, no ascites, no appreciable mass Extremities: No significant cyanosis, clubbing, or edema bilateral lower extremities - L BKA   CBC: Recent Labs  Lab 01/01/19 0419 01/02/19 0616 01/04/19 0314  WBC 6.8 6.7 6.7  HGB 8.2* 9.0* 8.8*  HCT 24.7* 27.9* 28.9*  MCV  85.2 85.3 87.0  PLT 258 293 482   Basic Metabolic Panel: Recent Labs  Lab 01/02/19 0616 01/03/19 0550 01/04/19 0314  NA 136 138 139  K 4.6 4.2 4.6  CL 97* 101 100  CO2 '25 28 29  ' GLUCOSE 209* 141* 252*  BUN  35* 41* 34*  CREATININE 0.72 0.69 0.63  CALCIUM 8.8* 8.7* 8.9  MG 1.9 2.2 2.1  PHOS 4.1 3.9 5.4*   GFR: Estimated Creatinine Clearance: 107.8 mL/min (by C-G formula based on SCr of 0.63 mg/dL).  Liver Function Tests: No results for input(s): AST, ALT, ALKPHOS, BILITOT, PROT, ALBUMIN in the last 168 hours. No results for input(s): LIPASE, AMYLASE in the last 168 hours. No results for input(s): AMMONIA in the last 168 hours.   HbA1C: Hgb A1c MFr Bld  Date/Time Value Ref Range Status  03/25/2017 05:59 AM 12.3 (H) 4.8 - 5.6 % Final    Comment:    (NOTE)         Pre-diabetes: 5.7 - 6.4         Diabetes: >6.4         Glycemic control for adults with diabetes: <7.0   12/28/2015 08:50 PM 11.7 (H) 4.8 - 5.6 % Final    Comment:    (NOTE)         Pre-diabetes: 5.7 - 6.4         Diabetes: >6.4         Glycemic control for adults with diabetes: <7.0     CBG: Recent Labs  Lab 01/04/19 1610 01/04/19 2102 01/04/19 2318 01/05/19 0806 01/05/19 1159  GLUCAP 127* 121* 131* 164* 257*    Recent Results (from the past 240 hour(s))  MRSA PCR Screening     Status: None   Collection Time: 01/04/19  9:26 PM  Result Value Ref Range Status   MRSA by PCR NEGATIVE NEGATIVE Final    Comment:        The GeneXpert MRSA Assay (FDA approved for NASAL specimens only), is one component of a comprehensive MRSA colonization surveillance program. It is not intended to diagnose MRSA infection nor to guide or monitor treatment for MRSA infections. Performed at [facility], [place] [address] [place], [place] [address]      Scheduled Meds: . acetylcysteine  4 mL Nebulization Q4H  . aspirin  81 mg Per Tube Daily  . atorvastatin  40 mg Oral q1800  . carvedilol  6.25 mg Oral BID WC  . chlorhexidine gluconate (MEDLINE KIT)  15 mL Mouth Rinse BID  . clonazePAM  0.5 mg Per Tube BID  . enoxaparin (LOVENOX) injection  40 mg Subcutaneous Q24H  . famotidine  20 mg Per Tube BID  . feeding  supplement (PRO-STAT SUGAR FREE 64)  60 mL Per Tube BID  . fentaNYL  75 mcg Transdermal Q72H  . folic acid  1 mg Per Tube Daily  . guaiFENesin  5 mL Per Tube Q12H  . insulin aspart  0-20 Units Subcutaneous Q4H  . insulin detemir  50 Units Subcutaneous BID  . ipratropium-albuterol  3 mL Nebulization Q4H  . losartan  25 mg Oral Daily  . mouth rinse  15 mL Mouth Rinse QID  . multivitamin  15 mL Oral Daily  . nutrition supplement (JUVEN)  1 packet Per Tube BID BM  . QUEtiapine  200 mg Per Tube BID  . thiamine  100 mg Per Tube Daily     LOS: 38 days   Dellis Filbert  Hennie Duos, MD [facility] Office  [phone] Pager - Text Page per Amion  If 7PM-7AM, please contact night-coverage per Amion 01/05/2019, 2:34 PM

## 2019-01-06 ENCOUNTER — Inpatient Hospital Stay (HOSPITAL_COMMUNITY): Payer: Medicaid Other

## 2019-01-06 ENCOUNTER — Encounter (HOSPITAL_COMMUNITY): Payer: Self-pay | Admitting: General Practice

## 2019-01-06 LAB — CBC
HEMATOCRIT: 29.7 % — AB (ref 39.0–52.0)
Hemoglobin: 9.4 g/dL — ABNORMAL LOW (ref 13.0–17.0)
MCH: 27.7 pg (ref 26.0–34.0)
MCHC: 31.6 g/dL (ref 30.0–36.0)
MCV: 87.6 fL (ref 80.0–100.0)
Platelets: 323 10*3/uL (ref 150–400)
RBC: 3.39 MIL/uL — ABNORMAL LOW (ref 4.22–5.81)
RDW: 14.2 % (ref 11.5–15.5)
WBC: 7.3 10*3/uL (ref 4.0–10.5)
nRBC: 0 % (ref 0.0–0.2)

## 2019-01-06 LAB — COMPREHENSIVE METABOLIC PANEL
ALT: 32 U/L (ref 0–44)
AST: 21 U/L (ref 15–41)
Albumin: 1.9 g/dL — ABNORMAL LOW (ref 3.5–5.0)
Alkaline Phosphatase: 88 U/L (ref 38–126)
Anion gap: 9 (ref 5–15)
BUN: 45 mg/dL — ABNORMAL HIGH (ref 6–20)
CO2: 34 mmol/L — AB (ref 22–32)
Calcium: 9.1 mg/dL (ref 8.9–10.3)
Chloride: 101 mmol/L (ref 98–111)
Creatinine, Ser: 0.71 mg/dL (ref 0.61–1.24)
GFR calc Af Amer: >60 mL/min (ref >60)
GFR calc non Af Amer: >60 mL/min (ref >60)
GLUCOSE: 182 mg/dL — AB (ref 70–99)
Potassium: 4.1 mmol/L (ref 3.5–5.1)
SODIUM: 144 mmol/L (ref 135–145)
Total Bilirubin: 0.3 mg/dL (ref 0.3–1.2)
Total Protein: 7.6 g/dL (ref 6.5–8.1)

## 2019-01-06 LAB — FOLATE: Folate: 34.5 ng/mL (ref >5.9)

## 2019-01-06 LAB — GLUCOSE, CAPILLARY
Glucose-Capillary: 187 mg/dL — ABNORMAL HIGH (ref 70–99)
Glucose-Capillary: 157 mg/dL — ABNORMAL HIGH (ref 70–99)
Glucose-Capillary: 141 mg/dL — ABNORMAL HIGH (ref 70–99)
Glucose-Capillary: 157 mg/dL — ABNORMAL HIGH (ref 70–99)

## 2019-01-06 LAB — VITAMIN B12: Vitamin B-12: 874 pg/mL (ref 180–914)

## 2019-01-06 LAB — AMMONIA: Ammonia: 19 umol/L (ref 9–35)

## 2019-01-06 MED ORDER — QUETIAPINE FUMARATE 100 MG PO TABS
100.0000 mg | ORAL_TABLET | Freq: Two times a day (BID) | ORAL | Status: DC
  Administered 2019-01-06 – 2019-01-20 (×28): 100 mg
  Filled 2019-01-06 (×6): qty 1
  Filled 2019-01-06: qty 2
  Filled 2019-01-06 (×7): qty 1
  Filled 2019-01-06: qty 2
  Filled 2019-01-06: qty 1
  Filled 2019-01-06: qty 2
  Filled 2019-01-06 (×2): qty 1
  Filled 2019-01-06: qty 2
  Filled 2019-01-06 (×5): qty 1
  Filled 2019-01-06: qty 2
  Filled 2019-01-06 (×2): qty 1

## 2019-01-06 MED ORDER — IPRATROPIUM-ALBUTEROL 0.5-2.5 (3) MG/3ML IN SOLN
3.0000 mL | Freq: Four times a day (QID) | RESPIRATORY_TRACT | Status: DC
  Administered 2019-01-06 – 2019-01-22 (×62): 3 mL via RESPIRATORY_TRACT
  Filled 2019-01-06 (×61): qty 3

## 2019-01-06 MED ORDER — FENTANYL CITRATE (PF) 100 MCG/2ML IJ SOLN
50.0000 ug | INTRAMUSCULAR | Status: DC | PRN
  Administered 2019-01-07 – 2019-01-08 (×3): 50 ug via INTRAVENOUS
  Filled 2019-01-06 (×3): qty 2

## 2019-01-06 MED ORDER — LIDOCAINE VISCOUS HCL 2 % MT SOLN
15.0000 mL | Freq: Once | OROMUCOSAL | Status: DC
  Filled 2019-01-06: qty 15

## 2019-01-06 NOTE — Progress Notes (Signed)
Patient is extremely uncooperative. Wanting to get out of the bed, but then not wanting to use prosthesis, walker, gait belt, or staff to assist. Mouthed "Bye" and "Shut up" to staff attempting to transfer from bed to bsc after incontinent stool.  Patient continues to attempt out of bed. Lawson Radar

## 2019-01-06 NOTE — Procedures (Signed)
Tracheostomy Change Note  Patient Details:   Name: Antonio [name]. DOB: 12/06/1955 MRN: [id number]    Airway Documentation:     Evaluation  O2 sats: stable throughout Complications: No apparent complications Patient did tolerate procedure well. Bilateral Breath Sounds: Rhonchi    #6XLT removed per MD order #5XLT cfls placed without difficulty  =BBS, good air movement   Cherylin Mylar 01/06/2019, 3:23 PM

## 2019-01-06 NOTE — Progress Notes (Signed)
NAME:  Antonio Riebel., MRN:  [id number], DOB:  December 16, 1955, LOS: 59 ADMISSION DATE:  11/28/2018, CONSULTATION DATE:  1/3 REFERRING MD:  EDP, CHIEF COMPLAINT:  Dyspnea   Brief History   63 year old male with influenza A causing respiratory failure and cardiogenic shock. Prolonged mechanical ventilation, tracheostomy placed on January 16.  Past Medical History  DM2, CAD, HTN  Significant Hospital Events   1/6 self extubated 1/7 re-intubated 1/8 self extubated 1/9 acute respiratory failure, mucus plugging right lower lobe, re-intubated, bronchoscopy 1/10 New fever T Max 103.1, antibiotics resumed with ceftaz and vancomycin 1/11: Hemodynamically seems to be improving. Very hypernatremic. X-ray improving. Adding free water, one-time Lasix, follow-up chemistry a.m. Hoping to initiate weaning efforts on 1/12 1/16. Continuing diuresis and ongoing ventilatory support. Working on weaning but still has significant work of breathing. Plan has been to proceed with tracheostomy. Hopefully we can continue ongoing diuresis and continue to decrease sedation 1/20 still struggling to wean. Secretions and anxiety are barriers.  1/21 trach revision to 6 distal XLT, fever, increased secretions, abx broadened / bronch 1/22 ongoing thick secretions 1/24 Failed SBT, low grade fever, Per CXR persistent pleural effusions / basilar consolidation atelectasis vs pneumonia per bases 1/25>> Mucus plugged with slow recovery after chest PT and Wound Care 2/3 > first time weaned for greater than 15 minutes (previously failed due to agitation, secretions, tachypnea)  Consults:  PCCM Cardiology   Procedures:  ETT 1/3 >>1/6, 1/7 > 1/8> 1/9>> 1/16 1/5 Right TL PICC >> 2/4 1/9 Bronch>> Mucus plugging RLL Trach 1/16 (JY) >>1/20 trach change failed and #4 cuffed 1/21 trach upsized to 6 cuffed XLT distal  1/17 right nare cortrak >> 1/29 1/28 gastrostomy tube >   Significant Diagnostic Tests:  UDS  1/4 >>negative  ECHO 1/4 >>LVEF 20-25%, akinesis of the anteroseptal, anterolateral & apical myocardium, LVEF 83-15%, grade 1 diastolic dysfunction, small pericardial effusion without evidence of hemodynamic compromise. CT Chest / ABD / Pelvis 1/3 >> airway thickening, motion artifact, CAD Echo 1/29>> LVEF 60-65%, left ventricular hypertrophy, trivial pericardial effusion  Micro Data:  RVP 1/3 >> positive for influenza A BCx2 1/3 >>No growth UC 1/4 >>No Growth Sputum Cx 1/7 >> Normal respiratory flora BAL >>corynebacterium  Blood Cx 1/10 > neg Sputum 1/21 > abundant corynebacterium striatum  Antimicrobials:  Vanco 1/3 x1;    Zosyn 1/30 x1  Tamiflu 1/3 >1/8 1/10 vancomycin>1/12 1/10 ceftaz >1/12;   1/21 >>1/22 1/12 emycin > 1/22 1/23 cefepime >> 1/24 1/21 Vanc> 1/28  Interim history/subjective:  No events overnight. Tolerating TC without need for vent at night  Objective   Blood pressure 102/63, pulse 97, temperature 98.1 F (36.7 C), temperature source Oral, resp. rate 20, height 6' (1.829 m), weight 86.8 kg, SpO2 91 %.    FiO2 (%):  [36 %-40 %] 36 %   Intake/Output Summary (Last 24 hours) at 01/06/2019 1502 Last data filed at 01/06/2019 1426 Gross per 24 hour  Intake 1155 ml  Output 1 ml  Net 1154 ml   Filed Weights   01/04/19 0444 01/05/19 0500 01/06/19 0500  Weight: 89.3 kg 88.5 kg 86.8 kg    Physical Exam: General: Well appearing, NAD HENT: [place]/AT, PERRL, EOM-I and MMM Neck: Trach in place Respiratory: Coarse BS diffusely Cardiovascular: RRR, Nl S1/S2 and -M/R/G GI: Soft, NT, ND and +BS Extremities:L BKA, partial amputation of R foot, no erythema Neuro: AAO x4, CNII-XII grossly intact Skin: Intact, no rashes or bruising Psych: Agitated mood, normal affect  I reviewed CXR myself, trach is in a good position  Resolved Hospital Problem list   AKI Pneumonia Hypokalemia Hypernatremia Discussed with PCCM-NP and RT  Assessment & Plan:   Acute  respiratory failure with hypoxemia Prolonged respiratory failure after flu a infection, Corynebacterium pneumonia. S/p tracheostomy Change trach to a cuffless 5 distal XLT Routine trach care Titrate O2 for sat of 88-92% SLP now that the trach is smaller Diureses as renal function allows PT/OT/Speech  Maintain in SDU, PCCM will follow for trach management  Rush Farmer, M.D. [facility] Pulmonary/Critical Care Medicine. Pager: [phone]. After hours pager: [phone].

## 2019-01-06 NOTE — Progress Notes (Signed)
[place] TEAM [place].  KGM:[id number] DOB: [birth year] DOA: 11/28/2018 PCP: Imagene Riches, NP    Brief Narrative:  63yo male with influenza A causing respiratory failure and cardiogenic shock. Prolonged mechanical ventilation, tracheostomy placed on January 16.  Significant Events:  1/6 self extubated  1/7 re-intubated  1/8 self extubated 1/9 acute respiratory failure, mucus plugging right lower lobe, re-intubated, bronchoscopy 1/10 New fever T Max 103.1, antibiotics resumed with ceftaz and vancomycin 1/11 Hemodynamically seems to be improving. Very hypernatremic. X-ray improving. Adding free water, one-time Lasix, follow-up chemistry a.m. Hoping to initiate weaning efforts on 1/12 1/16 Continuing diuresis and ongoing ventilatory support. Working on weaning but still has significant work of breathing. Plan has been to proceed with tracheostomy. Hopefully we can continue ongoing diuresis and continue to decrease sedation 1/20 still struggling to wean. Secretions and anxiety are barriers.  1/21 trach revision to 6 distal XLT, fever, increased secretions, abx broadened / bronch 1/22 ongoing thick secretions 1/24 Failed SBT, low grade fever, Per CXR persistent pleural effusions / basilar consolidation atelectasis vs pneumonia per bases 1/25 Mucus plugged with slow recovery after chest PT and Wound Care 2/3  first time weaned for greater than 15 minutes (previously failed due to agitation, secretions, tachypnea) 2/10 TR[facility] assumed care 2/11 trach changed to a #5 cuffless per PCCM   Subjective: The pt has been "frustrated" today per his son. He has been uncooperative w/ staff who have attempted to assist him. He had his trach changed by PCCM today w/o incident. He indicates to me that he is unhappy in general, but can not provide further detail.   Assessment & Plan:  Acute hypoxic respiratory failure - Influenza A / Corynebacterium pneumonia -  Prolonged respiratory failure  Ongoing care of trach per PCCM - appears to be slowly improving - downsized to #5 cuffless today    Acute systolic heart failure no plans for cardiac cath per Cardiology - EF 20-25% via TTE 11/29/18, but improved to 60-65% on f/u 1/29 - likely due to sepsis - appears euvolemic at this time - cont BB and ARB Filed Weights   01/04/19 0444 01/05/19 0500 01/06/19 0500  Weight: 89.3 kg 88.5 kg 86.8 kg     Nutrition status post PEG tube by IR - cont tube feeds - hopeful to make progress w/ SLP tx soon   Acute metabolic encephalopathy Continue Seroquel and lorazepam - appears slow to improve - has been uncooperative today / agitated   DM2 CBG variable - follow w/o change  Deconditioning Will need long term rehab option once stable for d/c  Sacral decub Per wound care  DVT prophylaxis: lovenox  Code Status: FULL CODE Family Communication: spoke w/ son at bedside  Disposition Plan: SDU  Consultants:  PCCM Cardiology   Antimicrobials:  Vanco 1/3 x1 Zosyn 1/30 x1  Tamiflu 1/3 >1/8 1/10 vancomycin>1/12 1/10 ceftaz >1/12; 1/21 >>1/22 1/12 emycin > 1/22 1/23 cefepime >> 1/24 1/21 Vanc> 1/28  Objective: Blood pressure 102/63, pulse (!) 102, temperature 98.1 F (36.7 C), temperature source Oral, resp. rate 18, height 6' (1.829 m), weight 86.8 kg, SpO2 96 %.  Intake/Output Summary (Last 24 hours) at 01/06/2019 1630 Last data filed at 01/06/2019 1426 Gross per 24 hour  Intake 1100 ml  Output 1 ml  Net 1099 ml   Filed Weights   01/04/19 0444 01/05/19 0500 01/06/19 0500  Weight: 89.3 kg 88.5 kg 86.8 kg    Examination: General:  No acute respiratory distress - appears frustrated and withdrawn Lungs: Clear to auscultation bilaterally - good air movement B fields  Cardiovascular: RRR Abdomen: NT/ND, soft, bs+, no mass  Extremities: No signif LE edema - L BKA   CBC: Recent Labs  Lab 01/02/19 0616 01/04/19 0314 01/06/19 0521  WBC 6.7  6.7 7.3  HGB 9.0* 8.8* 9.4*  HCT 27.9* 28.9* 29.7*  MCV 85.3 87.0 87.6  PLT 293 287 833   Basic Metabolic Panel: Recent Labs  Lab 01/02/19 0616 01/03/19 0550 01/04/19 0314 01/06/19 0521  NA 136 138 139 144  K 4.6 4.2 4.6 4.1  CL 97* 101 100 101  CO2 _0 34*  GLUCOSE 209* 141* 252* 182*  BUN 35* 41* 34* 45*  CREATININE 0.72 0.69 0.63 0.71  CALCIUM 8.8* 8.7* 8.9 9.1  MG 1.9 2.2 2.1  --   PHOS 4.1 3.9 5.4*  --    GFR: Estimated Creatinine Clearance: 107.8 mL/min (by C-G formula based on SCr of 0.71 mg/dL).  Liver Function Tests: Recent Labs  Lab 01/06/19 0521  AST 21  ALT 32  ALKPHOS 88  BILITOT 0.3  PROT 7.6  ALBUMIN 1.9*    Recent Labs  Lab 01/06/19 0521  AMMONIA 19     HbA1C: Hgb A1c MFr Bld  Date/Time Value Ref Range Status  03/25/2017 05:59 AM 12.3 (H) 4.8 - 5.6 % Final    Comment:    (NOTE)         Pre-diabetes: 5.7 - 6.4         Diabetes: >6.4         Glycemic control for adults with diabetes: <7.0   12/28/2015 08:50 PM 11.7 (H) 4.8 - 5.6 % Final    Comment:    (NOTE)         Pre-diabetes: 5.7 - 6.4         Diabetes: >6.4         Glycemic control for adults with diabetes: <7.0     CBG: Recent Labs  Lab 01/05/19 1543 01/05/19 2004 01/05/19 2349 01/06/19 0335 01/06/19 0901  GLUCAP 137* 85 188* 187* 157*    Recent Results (from the past 240 hour(s))  MRSA PCR Screening     Status: None   Collection Time: 01/04/19  9:26 PM  Result Value Ref Range Status   MRSA by PCR NEGATIVE NEGATIVE Final    Comment:        The GeneXpert MRSA Assay (FDA approved for NASAL specimens only), is one component of a comprehensive MRSA colonization surveillance program. It is not intended to diagnose MRSA infection nor to guide or monitor treatment for MRSA infections. Performed at [facility], [place] [address] [place], [place] [address]      Scheduled Meds: . aspirin  81 mg Per Tube Daily  . atorvastatin  40 mg Per Tube q1800  .  carvedilol  6.25 mg Per Tube BID WC  . chlorhexidine gluconate (MEDLINE KIT)  15 mL Mouth Rinse BID  . clonazePAM  0.5 mg Per Tube BID  . enoxaparin (LOVENOX) injection  40 mg Subcutaneous Q24H  . famotidine  20 mg Per Tube BID  . feeding supplement (PRO-STAT SUGAR FREE 64)  60 mL Per Tube BID  . fentaNYL  75 mcg Transdermal Q72H  . folic acid  1 mg Per Tube Daily  . guaiFENesin  5 mL Per Tube Q12H  . insulin aspart  0-20 Units Subcutaneous Q4H  . insulin  detemir  50 Units Subcutaneous BID  . ipratropium-albuterol  3 mL Nebulization Q4H  . lidocaine  15 mL Mouth/Throat Once  . losartan  25 mg Per Tube Daily  . mouth rinse  15 mL Mouth Rinse QID  . multivitamin  15 mL Per Tube Daily  . nutrition supplement (JUVEN)  1 packet Per Tube BID BM  . QUEtiapine  200 mg Per Tube BID  . thiamine  100 mg Per Tube Daily     LOS: 25 days   Cherene Altes, MD [facility] Office  [phone] Pager - Text Page per Amion  If 7PM-7AM, please contact night-coverage per Amion 01/06/2019, 4:30 PM

## 2019-01-06 NOTE — Progress Notes (Signed)
  Speech Language Pathology Treatment: Antonio [name] Speaking valve  Patient Details Name: Antonio [name]. MRN: [id number] DOB: [birth year] Today's Date: 01/06/2019 Time: 9983-3825 SLP Time Calculation (min) (ACUTE ONLY): 18 min  Assessment / Plan / Recommendation Clinical Impression  RTT changed trach to cuffless this afternoon. He is tired but willing to participate and use valve. His volitional cough is moderately strong to expectorate approximately 50% of secretions. Breath support for speech is diminished and only able to achieve fleeting phonation despite cues (verbal/demonstration, repositioning). This should improve as endurance/strength improves, continued ST to achieve increased intensity. Moving closer to instrumental swallow assessment with hopeful reduction in secretion and increased ability to mobilize and utilize PMV.    HPI HPI: 63 year old male coming in with fever cough chest pain and shortness of breath was found to have influenza A. Intubated 1/3-1/8. PMH: diabetes mellitus, hypertension. CXR 1/7 Low volume chest with interstitial coarsening and streaky basilar density. No effusion or pneumothorax.      SLP Plan  Continue with current plan of care       Recommendations         Patient may use Passy-Muir Speech Valve: with SLP only PMSV Supervision: Full         Oral Care Recommendations: Oral care QID Follow up Recommendations: 24 hour supervision/assistance;Skilled Nursing facility SLP Visit Diagnosis: Aphonia (R49.1) Plan: Continue with current plan of care                       Royce Macadamia 01/06/2019, 4:14 PM  Breck Coons Lonell Face.Ed Nurse, children's [phone] Office [phone]

## 2019-01-06 NOTE — Progress Notes (Signed)
Physical Therapy Treatment Patient Details Name: Antonio [name]. MRN: [id number] DOB: 12-03-1955 Today's Date: 01/06/2019    History of Present Illness 63 yo admitted with flu A and PNA with acute respiratory failure intubated 1/4, trach 1/16 with course complicated by acute MI, CHF and cardiogenic shock and mucous plugging. PMhx: DM, HTN, HLD, CAD, Lt BKA, rt toe amputation    PT Comments    After max encouragement and education on importance of OOB mobility and his inability to have water or ice chips pt agreed to ambulate. Pt tolerated ambulation well and complete 46' with RW prior to having stool incontinence requiring him to sit in chair and then be transferred to [facility]. Pt progressing well. SPO2 >85% on trach collar at 8LO2. Pt with productive cough as well t/o session. Acute PT to cont to follow.  Follow Up Recommendations  SNF;Supervision/Assistance - 24 hour     Equipment Recommendations  Rolling walker with 5" wheels    Recommendations for Other Services       Precautions / Restrictions Precautions Precautions: Fall Precaution Comments: trach, L BKA, PEG Restrictions Weight Bearing Restrictions: No    Mobility  Bed Mobility Overal bed mobility: Needs Assistance Bed Mobility: Supine to Sit     Supine to sit: Mod assist     General bed mobility comments: max encouragement, pt able to bring LEs off EOB then pulled up on PT to bring trunk to EOB  Transfers Overall transfer level: Needs assistance Equipment used: Rolling walker (2 wheeled) Transfers: Sit to/from Stand Sit to Stand: Min assist;+2 safety/equipment         General transfer comment: pt complete 3 sit to stand transfers, minA for walker management, verbal cues to push up from bed/chair  Ambulation/Gait Ambulation/Gait assistance: Mod assist;+2 safety/equipment Gait Distance (Feet): 50 Feet Assistive device: Rolling walker (2 wheeled) Gait Pattern/deviations: Step-to pattern;Narrow base of  support;Trunk flexed Gait velocity: decreased Gait velocity interpretation: <1.8 ft/sec, indicate of risk for recurrent falls General Gait Details: verbal cues to stand up right/decrease bilat UE WBing, pt with increased productive cough and had episode of stool incontinence requirign pt to sit in chair, during ambulation pt with decresaed L knee extension and amb with feet very close together   Stairs             Wheelchair Mobility    Modified Rankin (Stroke Patients Only)       Balance Overall balance assessment: Needs assistance Sitting-balance support: No upper extremity supported;Feet supported Sitting balance-Leahy Scale: Fair Sitting balance - Comments: assisted pt with putting on L LE prosthesis Postural control: Right lateral lean Standing balance support: Bilateral upper extremity supported Standing balance-Leahy Scale: Poor Standing balance comment: bil UE supported                            Cognition Arousal/Alertness: Awake/alert Behavior During Therapy: Flat affect Overall Cognitive Status: Impaired/Different from baseline Area of Impairment: Safety/judgement                         Safety/Judgement: Decreased awareness of safety(can be impulsive)   Problem Solving: Slow processing;Requires verbal cues;Requires tactile cues General Comments: HOH, pt can be ornary but with max encouragement and explaination pt works with you       Exercises      General Comments General comments (skin integrity, edema, etc.): pt with sacral wound, dressing changed in standing due to  stool on dressing      Pertinent Vitals/Pain Pain Assessment: Faces Faces Pain Scale: Hurts even more Pain Location: butt when hygiene provided Pain Descriptors / Indicators: Discomfort;Sore Pain Intervention(s): Monitored during session    Home Living                      Prior Function            PT Goals (current goals can now be found in the  care plan section) Acute Rehab PT Goals Patient Stated Goal: "I want water" Progress towards PT goals: Progressing toward goals    Frequency    Min 3X/week      PT Plan Current plan remains appropriate    Co-evaluation              AM-PAC PT "6 Clicks" Mobility   Outcome Measure  Help needed turning from your back to your side while in a flat bed without using bedrails?: A Little Help needed moving from lying on your back to sitting on the side of a flat bed without using bedrails?: A Little Help needed moving to and from a bed to a chair (including a wheelchair)?: A Lot Help needed standing up from a chair using your arms (e.g., wheelchair or bedside chair)?: A Lot Help needed to walk in hospital room?: A Lot Help needed climbing 3-5 steps with a railing? : Total 6 Click Score: 13    End of Session Equipment Utilized During Treatment: Gait belt Activity Tolerance: Patient tolerated treatment well Patient left: (on BSC with RN) Nurse Communication: Mobility status PT Visit Diagnosis: Other abnormalities of gait and mobility (R26.89);Muscle weakness (generalized) (M62.81);Unsteadiness on feet (R26.81)     Time: 6222-9798 PT Time Calculation (min) (ACUTE ONLY): 43 min  Charges:  $Gait Training: 8-22 mins $Therapeutic Activity: 23-37 mins                     [name], PT, DPT Acute Rehabilitation Services Pager #: [phone] Office #: [phone]    [name] 01/06/2019, 11:59 AM

## 2019-01-06 NOTE — Progress Notes (Addendum)
Occupational Therapy Treatment Patient Details Name: Antonio RichardsDaniel Watson Spiers Jr. MRN: [id number] DOB: 09/10/1956 Today's Date: 01/06/2019    History of present illness 63 yo admitted with flu A and PNA with acute respiratory failure intubated 1/4, trach 1/16 with course complicated by acute MI, CHF and cardiogenic shock and mucous plugging. PMhx: DM, HTN, HLD, CAD, Lt BKA, rt toe amputation   OT comments  Patient supine in bed, incontinent of BM.  Assisted to sitting EOB with mod assist, but [facility]  EOB patient removing oxygen via trach, which therapist replaced and educated on importance.  Requires max assist +2 for sit to stand at EOB with RW but unable to safely transfer to 3:1 without prosthetic therefore transitioned to supine and +2 assist for hygiene and bed linen changed.  Patient agitated and frustrated with assistance provided, following some commands inconsistently; mouthing frustrations "shut up" and "stop".  Continues to be limited by decreased activity tolerance, cognition, safety awareness and participation. Will follow.    Follow Up Recommendations  LTACH    Equipment Recommendations  Other (comment)(defer to next venue of care)    Recommendations for Other Services      Precautions / Restrictions Precautions Precautions: Fall Precaution Comments: trach, L BKA, PEG Restrictions Weight Bearing Restrictions: No       Mobility Bed Mobility Overal bed mobility: Needs Assistance Bed Mobility: Supine to Sit;Rolling Rolling: Mod assist   Supine to sit: Mod assist     General bed mobility comments: max encouragement, able to bring LEs off EOB but mod assist to ascend trunk   Transfers Overall transfer level: Needs assistance Equipment used: Rolling walker (2 wheeled) Transfers: Sit to/from Stand Sit to Stand: Max assist;+2 physical assistance;+2 safety/equipment         General transfer comment: pt declines to don prosthetic  to L LE, max assist +2 sit to stand with RW at EOB (pt attempting to pivot to 3:1 commode, but deferred due to safety); cueing for hand placement and safety     Balance Overall balance assessment: Needs assistance Sitting-balance support: No upper extremity supported;Feet supported Sitting balance-Leahy Scale: Fair Sitting balance - Comments: min guard for safety at EOB Postural control: Right lateral lean Standing balance support: Bilateral upper extremity supported;During functional activity Standing balance-Leahy Scale: Poor Standing balance comment: bil UE supported                           ADL either performed or assessed with clinical judgement   ADL Overall ADL's : Needs assistance/impaired                 Upper Body Dressing : Maximal assistance;Bed level Upper Body Dressing Details (indicate cue type and reason): changing gown, limited assist from patient  Lower Body Dressing: Total assistance;Bed level Lower Body Dressing Details (indicate cue type and reason): total assist to don R sock, patient declined to attempt   Toilet Transfer Details (indicate cue type and reason): sit to stand +2, deferred transfer due to safety and patient declining to don prosthetic Toileting- Clothing Manipulation and Hygiene: Total assistance;Bed level Toileting - Clothing Manipulation Details (indicate cue type and reason): total assist for hygiene after incontience of BM in bed     Functional mobility during ADLs: Maximal assistance;+2 for physical assistance;Moderate assistance;+2 for safety/equipment General ADL Comments: patient with increased agitation today, completing bed mobility and hygiene after +BM; limited participation  Vision       Perception     Praxis      Cognition Arousal/Alertness: Awake/alert Behavior During Therapy: Flat affect;Agitated Overall Cognitive Status: Impaired/Different from baseline Area of Impairment: Problem  solving;Safety/judgement                         Safety/Judgement: Decreased awareness of safety   Problem Solving: Slow processing;Requires verbal cues;Requires tactile cues;Difficulty sequencing General Comments: patient with increased agitation today, reports aware of incontient BM but inconsistent following commands to assist patient with hygiene         Exercises     Shoulder Instructions       General Comments RN changed sacral dressing sidelying in bed    Pertinent Vitals/ Pain       Pain Assessment: Faces Faces Pain Scale: Hurts even more Pain Location: bottom when hygiene performed Pain Descriptors / Indicators: Discomfort;Sore Pain Intervention(s): Monitored during session  Home Living                                          Prior Functioning/Environment              Frequency  Min 2X/week        Progress Toward Goals  OT Goals(current goals can now be found in the care plan section)  Progress towards OT goals: Progressing toward goals  Acute Rehab OT Goals Patient Stated Goal: none stated Time For Goal Achievement: 01/14/19 Potential to Achieve Goals: Good  Plan Discharge plan remains appropriate;Frequency remains appropriate    Co-evaluation                 AM-PAC OT "6 Clicks" Daily Activity     Outcome Measure   Help from another person eating meals?: Total Help from another person taking care of personal grooming?: A Lot Help from another person toileting, which includes using toliet, bedpan, or urinal?: Total Help from another person bathing (including washing, rinsing, drying)?: A Lot Help from another person to put on and taking off regular upper body clothing?: Total Help from another person to put on and taking off regular lower body clothing?: Total 6 Click Score: 8    End of Session Equipment Utilized During Treatment: Oxygen;Gait belt(via trach collar)  OT Visit Diagnosis: Unsteadiness on  feet (R26.81);Other abnormalities of gait and mobility (R26.89);Muscle weakness (generalized) (M62.81);Other symptoms and signs involving cognitive function   Activity Tolerance Treatment limited secondary to agitation   Patient Left in bed;with call bell/phone within reach;with bed alarm set   Nurse Communication Mobility status        Time: 2919-1660 OT Time Calculation (min): 32 min  Charges: OT General Charges $OT Visit: 1 Visit OT Treatments $Self Care/Home Management : 8-22 mins $Therapeutic Activity: 8-22 mins  Chancy Milroy, OT Acute Rehabilitation Services Pager [phone] Office [phone]    Chancy Milroy 01/06/2019, 2:16 PM

## 2019-01-07 LAB — GLUCOSE, CAPILLARY
GLUCOSE-CAPILLARY: 182 mg/dL — AB (ref 70–99)
Glucose-Capillary: 164 mg/dL — ABNORMAL HIGH (ref 70–99)
Glucose-Capillary: 175 mg/dL — ABNORMAL HIGH (ref 70–99)
Glucose-Capillary: 188 mg/dL — ABNORMAL HIGH (ref 70–99)
Glucose-Capillary: 189 mg/dL — ABNORMAL HIGH (ref 70–99)
Glucose-Capillary: 190 mg/dL — ABNORMAL HIGH (ref 70–99)

## 2019-01-07 MED ORDER — DAKINS (1/4 STRENGTH) 0.125 % EX SOLN
Freq: Every day | CUTANEOUS | Status: DC
  Administered 2019-01-07 – 2019-01-11 (×5)
  Filled 2019-01-07 (×2): qty 473

## 2019-01-07 MED FILL — Fentanyl TD Patch 72HR 75 MCG/HR: TRANSDERMAL | Qty: 1 | Status: AC

## 2019-01-07 NOTE — Progress Notes (Signed)
PROGRESS NOTE    Antonio [name].  GQB:[id number] DOB: [birth year] DOA: 11/28/2018 PCP: Imagene Riches, NP   Brief Narrative:  63 year old with past medical history relevant for type 2 diabetes on insulin, hypertension, hyperlipidemia, 3 of coronary artery disease admitted on 11/29/2018 with acute hypoxic respiratory failure secondary to influenza A with prolonged hospitalization and failing from weaning from ventilator complicated by thick secretions, corynebacterium infection, substance-induced cardiomyopathy that resolved currently pending placement.   Assessment & Plan:   Active Problems:   Chest pain   SIRS (systemic inflammatory response syndrome) (HCC)   Acute respiratory failure with hypoxemia (HCC)   Pressure injury of skin   Influenza A virus present   Pneumonia   Tracheostomy status (HCC)   Acute pulmonary edema (HCC)   Hypoxia   Fever, unspecified   Tachycardia   Acute respiratory failure with hypoxia (HCC)   Hypoxemia   #) Chronic hypoxic respiratory failure: Due to influenza A and corynebacterium infection.  Complicated by heavy secretions. Lurline Idol care per [facility] -Speech-language pathology  #) Agitation/psych: At this time patient has episodes of agitation and has pulled his tracheostomy out.  Is unclear if this agitation is behavioral versus due to altered mental status.  Clinically the patient does not appear to be altered. -Continue quetiapine 100 mg twice daily - 0.5 mg twice daily  #) Acute systolic heart failure/history of coronary artery disease: Likely secondary to sepsis induced depression of EF.  On review the chart patient apparently had an abnormal micro-myocardial perfusion test but did not follow-up with cardiology.  Patient is never had a cardiac cath and no plans per cardiology -EF improved - Continue carvedilol 6.25 mg twice daily -Continue losartan 5 mg daily -Continue aspirin 81 mg -Continue statin  #) Hyperlipidemia: -Continue atorvastatin  40 mg daily  #) Type 2 diabetes: - Sliding scale insulin, every 4 hours - Continue detemir 50 units twice daily  #) Status post PEG tube: -Continue vital 1.5-calorie 55 mils an hour -Continue feeding supplementations -Continue free water  Fluids: Per above Electrolytes: Monitor and supplement Nutrition: Per above  Prophylaxis: Enoxaparin   Disposition: Pending skilled nursing facility evaluation  Full code  Consultants:   PCCM  Cardiology  Procedures:  1/4 intubation 1/4 echo: - Left ventricle: The cavity size was normal. Wall thickness was   normal. Systolic function was severely reduced. The estimated   ejection fraction was in the range of 20% to 25%. There is   akinesis of the anteroseptal, anterolateral, and apical   myocardium. Doppler parameters are consistent with abnormal left   ventricular relaxation (grade 1 diastolic dysfunction). - Pericardium, extracardiac: A small pericardial effusion was   identified circumferential to the heart. The fluid exhibited a   fibrinous appearance.There was no evidence of hemodynamic   compromise.   1/6 self extubated  1/7 re-intubated  1/8 self extubated 1/9 acute respiratory failure, mucus plugging right lower lobe, re-intubated, bronchoscopy 1/16 Tracheostomy placed 1/21 trach revision to 6 distal XLT, repeat bronch 1/29 echo: 1. The left ventricle appears to be normal in size, has mild wall thickness 60-65% ejection fraction Spectral Doppler shows impaired relaxation pattern of diastolic filling.  2. The right ventricle has normal size and normal systolic function.  3. Normal left atrial size.  4. Normal right atrial size.  5. The pericardial effusion is globally pericardial effusion.  6. Trivial pericardial effusion, as described above.  7. The mitral valve normal in structure and function.  8. Normal tricuspid valve.  9. Aortic valve normal. 10. No atrial level shunt detected by color flow Doppler. 2/10  Transferred to [facility] 2/11 trach changed to #5 cuffless  Antimicrobials:  Vanco 1/3 x1 Zosyn 1/30 x1  Tamiflu 1/3 >1/8 1/10 vancomycin>1/12 1/10 ceftaz >1/12; 1/21 >>1/22 1/12 emycin > 1/22 1/23 cefepime >> 1/24 1/21 Vanc> 1/28    Subjective: This morning the patient reports he is doing well.  He is requesting removal of his restraints.  He denies any chest pain, cough, congestion, rhinorrhea.  Objective: Vitals:   01/07/19 0438 01/07/19 0600 01/07/19 0753 01/07/19 0845  BP: (!) 128/57  133/69   Pulse: 76  89   Resp: (!) 22  20   Temp: 98.1 F (36.7 C)  98 F (36.7 C)   TempSrc: Oral  Oral   SpO2: 93%  98% 94%  Weight:  89.1 kg    Height:        Intake/Output Summary (Last 24 hours) at 01/07/2019 1059 Last data filed at 01/06/2019 1426 Gross per 24 hour  Intake -  Output 1 ml  Net -1 ml   Filed Weights   01/05/19 0500 01/06/19 0500 01/07/19 0600  Weight: 88.5 kg 86.8 kg 89.1 kg    Examination:  General exam: Appears calm and comfortable  Respiratory system: Transmitted upper airway sounds, no tachypnea, scattered rhonchi, no wheezes Cardiovascular system: Regular rate and rhythm, no murmurs Gastrointestinal system: Soft, nondistended, no rebound or guarding, plus bowel sounds Central nervous system: Alert and oriented.  Grossly intact, moving all extremities Extremities: No lower extremity edema. Skin: Tracheostomy site with numerous secretions, PEG tube site is clean dry and intact Psychiatry: Judgement and insight appear normal. Mood & affect upset    Data Reviewed: I have personally reviewed following labs and imaging studies  CBC: Recent Labs  Lab 01/01/19 0419 01/02/19 0616 01/04/19 0314 01/06/19 0521  WBC 6.8 6.7 6.7 7.3  HGB 8.2* 9.0* 8.8* 9.4*  HCT 24.7* 27.9* 28.9* 29.7*  MCV 85.2 85.3 87.0 87.6  PLT 258 293 287 790   Basic Metabolic Panel: Recent Labs  Lab 01/01/19 0419 01/02/19 0616 01/03/19 0550 01/04/19 0314 01/06/19 0521  NA  133* 136 138 139 144  K 4.1 4.6 4.2 4.6 4.1  CL 99 97* 101 100 101  CO2 '26 25 28 29 ' 34*  GLUCOSE 290* 209* 141* 252* 182*  BUN 37* 35* 41* 34* 45*  CREATININE 0.72 0.72 0.69 0.63 0.71  CALCIUM 8.4* 8.8* 8.7* 8.9 9.1  MG 1.8 1.9 2.2 2.1  --   PHOS 4.8* 4.1 3.9 5.4*  --    GFR: Estimated Creatinine Clearance: 107.8 mL/min (by C-G formula based on SCr of 0.71 mg/dL). Liver Function Tests: Recent Labs  Lab 01/06/19 0521  AST 21  ALT 32  ALKPHOS 88  BILITOT 0.3  PROT 7.6  ALBUMIN 1.9*   No results for input(s): LIPASE, AMYLASE in the last 168 hours. Recent Labs  Lab 01/06/19 0521  AMMONIA 19   Coagulation Profile: No results for input(s): INR, PROTIME in the last 168 hours. Cardiac Enzymes: No results for input(s): CKTOTAL, CKMB, CKMBINDEX, TROPONINI in the last 168 hours. BNP (last 3 results) No results for input(s): PROBNP in the last 8760 hours. HbA1C: No results for input(s): HGBA1C in the last 72 hours. CBG: Recent Labs  Lab 01/06/19 1743 01/06/19 1930 01/07/19 0035 01/07/19 0424 01/07/19 0752  GLUCAP 141* 157* 188* 189* 175*   Lipid Profile: No results for input(s): CHOL, HDL, LDLCALC, TRIG, CHOLHDL,  LDLDIRECT in the last 72 hours. Thyroid Function Tests: No results for input(s): TSH, T4TOTAL, FREET4, T3FREE, THYROIDAB in the last 72 hours. Anemia Panel: Recent Labs    01/06/19 0521  VITAMINB12 874  FOLATE 34.5   Sepsis Labs: No results for input(s): PROCALCITON, LATICACIDVEN in the last 168 hours.  Recent Results (from the past 240 hour(s))  MRSA PCR Screening     Status: None   Collection Time: 01/04/19  9:26 PM  Result Value Ref Range Status   MRSA by PCR NEGATIVE NEGATIVE Final    Comment:        The GeneXpert MRSA Assay (FDA approved for NASAL specimens only), is one component of a comprehensive MRSA colonization surveillance program. It is not intended to diagnose MRSA infection nor to guide or monitor treatment for MRSA  infections. Performed at [facility], [place] [address] [place], [place] [address]          Radiology Studies: No results found.      Scheduled Meds: . aspirin  81 mg Per Tube Daily  . atorvastatin  40 mg Per Tube q1800  . carvedilol  6.25 mg Per Tube BID WC  . chlorhexidine gluconate (MEDLINE KIT)  15 mL Mouth Rinse BID  . clonazePAM  0.5 mg Per Tube BID  . enoxaparin (LOVENOX) injection  40 mg Subcutaneous Q24H  . famotidine  20 mg Per Tube BID  . feeding supplement (PRO-STAT SUGAR FREE 64)  60 mL Per Tube BID  . fentaNYL  75 mcg Transdermal Q72H  . folic acid  1 mg Per Tube Daily  . guaiFENesin  5 mL Per Tube Q12H  . insulin aspart  0-20 Units Subcutaneous Q4H  . insulin detemir  50 Units Subcutaneous BID  . ipratropium-albuterol  3 mL Nebulization Q6H  . lidocaine  15 mL Mouth/Throat Once  . losartan  25 mg Per Tube Daily  . mouth rinse  15 mL Mouth Rinse QID  . multivitamin  15 mL Per Tube Daily  . nutrition supplement (JUVEN)  1 packet Per Tube BID BM  . QUEtiapine  100 mg Per Tube BID  . sodium hypochlorite   Irrigation Daily  . thiamine  100 mg Per Tube Daily   Continuous Infusions: . sodium chloride Stopped (12/30/18 0849)  . feeding supplement (VITAL 1.5 CAL) 1,000 mL (01/06/19 1534)     LOS: 40 days    Time spent: [place], MD [facility]  If 7PM-7AM, please contact night-coverage [email or web address] Password [facility] 01/07/2019, 10:59 AM

## 2019-01-07 NOTE — Progress Notes (Signed)
Deep suction provided at bedside

## 2019-01-07 NOTE — Progress Notes (Signed)
Nutrition Follow-up  DOCUMENTATION CODES:   Not applicable  INTERVENTION:  Continue Vital 1.5 formula via PEG at goal rate of 55 ml/hr.  Continue 60 ml Prostat BID per tube.   Continue Juven BID per tube.   Tube feeding regimen provides 2380 kcal (100% of needs), 149 grams of protein, 1003 ml water.   NUTRITION DIAGNOSIS:   Inadequate oral intake related to inability to eat as evidenced by NPO status; ongoing  GOAL:   Patient will meet greater than or equal to 90% of their needs; met with TF  MONITOR:   PO intake, TF tolerance, Labs, Skin, Weight trends, I & O's  REASON FOR ASSESSMENT:   Consult (Review TF for fluid )  ASSESSMENT:   63 y/o M who presented to [facility] 1/3 with fever, cough, chest pain & SOB. Pt found to be Influenza A positive. Developed progressive respiratory distress requiring intubation in the ER.    1/15 Cortrak placed (tip post-pyloric) 1/16 trach 1/28- Cortrak removed, PEG placed by IR   Pt continues on NPO status. Pt has been tolerating his tube feeding well with no abdominal discomfort. RD to continue with current tube feeding orders. Hydrotherapy ongoing. Labs and medications reviewed.   Diet Order:   Diet Order    None      EDUCATION NEEDS:   Not appropriate for education at this time  Skin:  Skin Assessment: Skin Integrity Issues: Skin Integrity Issues:: Stage I, Unstageable Stage I: L head Stage II: N/A Unstageable: sacrum  Last BM:  2/12  Height:   Ht Readings from Last 1 Encounters:  11/29/18 6' (1.829 m)    Weight:   Wt Readings from Last 1 Encounters:  01/07/19 89.1 kg    Ideal Body Weight:  76.1 kg(adjusted for L BKA)  BMI:  Body mass index is 26.64 kg/m.  Estimated Nutritional Needs:   Kcal:  3317-4099  Protein:  120-150 grams  Fluid:  > 2 L/day   Corrin Parker, MS, RD, LDN Pager # [phone] After hours/ weekend pager # [phone]

## 2019-01-07 NOTE — Consult Note (Signed)
WOC Nurse wound follow up Wound type:unstageable pressure injury Measurement:8cm x 7cm x 0.1cm Wound bed:70% white/yellow slough, 30% pink Drainage (amount, consistency, odor) copious bright green exudate Periwound:intact Dressing procedure/placement/frequency: Restart Dakin's for 5 days. Will inform PT Hydrotherapy of change. Will increase hydrotherapy to daily for 5 days until [facility] team re-evaluates. WOC will continue to follow, re-consult if needed prior to next visit.  Barnett Hatter, RN-C, WTA-C, OCA Wound Treatment Associate Ostomy Care Associate

## 2019-01-07 NOTE — Progress Notes (Signed)
NAME:  Antonio [name]., MRN:  [id number], DOB:  12-19-1955, LOS: 83 ADMISSION DATE:  11/28/2018, CONSULTATION DATE:  1/3 REFERRING MD:  EDP, CHIEF COMPLAINT:  Dyspnea   Brief History   63 year old male with influenza A causing respiratory failure and cardiogenic shock. Prolonged mechanical ventilation, tracheostomy placed on January 16.  Past Medical History  DM2, CAD, HTN  Significant Hospital Events   1/6 self extubated 1/7 re-intubated 1/8 self extubated 1/9 acute respiratory failure, mucus plugging right lower lobe, re-intubated, bronchoscopy 1/10 New fever T Max 103.1, antibiotics resumed with ceftaz and vancomycin 1/11: Hemodynamically seems to be improving. Very hypernatremic. X-ray improving. Adding free water, one-time Lasix, follow-up chemistry a.m. Hoping to initiate weaning efforts on 1/12 1/16. Continuing diuresis and ongoing ventilatory support. Working on weaning but still has significant work of breathing. Plan has been to proceed with tracheostomy. Hopefully we can continue ongoing diuresis and continue to decrease sedation 1/20 still struggling to wean. Secretions and anxiety are barriers.  1/21 trach revision to 6 distal XLT, fever, increased secretions, abx broadened / bronch 1/22 ongoing thick secretions 1/24 Failed SBT, low grade fever, Per CXR persistent pleural effusions / basilar consolidation atelectasis vs pneumonia per bases 1/25>> Mucus plugged with slow recovery after chest PT and Wound Care 2/3 > first time weaned for greater than 15 minutes (previously failed due to agitation, secretions, tachypnea)  Consults:  PCCM Cardiology   Procedures:  ETT 1/3 >>1/6, 1/7 > 1/8> 1/9>> 1/16 1/5 Right TL PICC >> 2/4 1/9 Bronch>> Mucus plugging RLL Trach 1/16 (JY) >>1/20 trach change failed and #4 cuffed 1/21 trach upsized to 6 cuffed XLT distal  1/17 right nare cortrak >> 1/29 1/28 gastrostomy tube >   Significant Diagnostic Tests:  UDS  1/4 >>negative  ECHO 1/4 >>LVEF 20-25%, akinesis of the anteroseptal, anterolateral & apical myocardium, LVEF 62-13%, grade 1 diastolic dysfunction, small pericardial effusion without evidence of hemodynamic compromise. CT Chest / ABD / Pelvis 1/3 >> airway thickening, motion artifact, CAD Echo 1/29>> LVEF 60-65%, left ventricular hypertrophy, trivial pericardial effusion  Micro Data:  RVP 1/3 >> positive for influenza A BCx2 1/3 >>No growth UC 1/4 >>No Growth Sputum Cx 1/7 >> Normal respiratory flora BAL >>corynebacterium  Blood Cx 1/10 > neg Sputum 1/21 > abundant corynebacterium striatum  Antimicrobials:  Vanco 1/3 x1;    Zosyn 1/30 x1  Tamiflu 1/3 >1/8 1/10 vancomycin>1/12 1/10 ceftaz >1/12;   1/21 >>1/22 1/12 emycin > 1/22 1/23 cefepime >> 1/24 1/21 Vanc> 1/28  Interim history/subjective:  No events overnight, tolerated 5 cuffless distal XLT without difficulty  Objective   Blood pressure 122/89, pulse 99, temperature (!) 97.4 F (36.3 C), temperature source Oral, resp. rate 20, height 6' (1.829 m), weight 89.1 kg, SpO2 97 %.    FiO2 (%):  [36 %-60 %] 60 %  No intake or output data in the 24 hours ending 01/07/19 1654 Filed Weights   01/05/19 0500 01/06/19 0500 01/07/19 0600  Weight: 88.5 kg 86.8 kg 89.1 kg    Physical Exam: General: Chronically ill appearing but much improved compared to before HENT: [place]/AT, PERRL, EOM-I and MMM Neck: Trach in place and midline Respiratory: Coarse BS diffusely Cardiovascular: RRR, Nl S1/S2 and -M/R/G GI: Soft, NT, ND and +BS Extremities:L BKA, partial amputation of R foot, no erythema Neuro: AAO x4, CNII-XII grossly intact Skin: Intact, no rashes or bruising Psych: Agitated mood, normal affect  I reviewed CXR myself, trach is in a good position  Resolved  Hospital Problem list   AKI Pneumonia Hypokalemia Hypernatremia Discussed with PCCM-NP  Assessment & Plan:   Acute respiratory failure with hypoxemia Prolonged  respiratory failure after flu a infection, Corynebacterium pneumonia. S/p tracheostomy Will change trach to a cuffless 4 shiley in AM on the way to decannulation, then cap on Friday if tolerated through the weekend, if continues to be capped through the weekend then decannulate on Monday. Routine trach care Titrate O2 for sat of 88-92% SLP Diureses per primary PT/OT/Speech  Trach management plan as above  Rush Farmer, M.D. [facility] Pulmonary/Critical Care Medicine. Pager: [phone]. After hours pager: [phone].

## 2019-01-07 NOTE — Progress Notes (Signed)
Physical Therapy Wound Treatment Patient Details  Name: Antonio [name]. MRN: [id number] Date of Birth: [birth year]  Today's Date: 01/07/2019 Time: 0263-7858 Time Calculation (min): 50 min  Subjective  Subjective: Pt more lethargic this session Patient and Family Stated Goals: Pt on trach collar Date of Onset: (unknown) Prior Treatments: dressing changes  Pain Score: Flinching during pulse lavage; pre medicated  Wound Assessment  Pressure Injury 12/01/18 Unstageable - Full thickness tissue loss in which the base of the ulcer is covered by slough (yellow, tan, gray, green or brown) and/or eschar (tan, brown or black) in the wound bed. sacrum (Active)  Wound Image   01/07/2019  3:34 PM  Dressing Type Barrier Film (skin prep);ABD;Gauze (Comment) 01/07/2019  3:34 PM  Dressing Clean;Dry;Intact;Changed 01/07/2019  3:34 PM  Dressing Change Frequency Daily 01/07/2019  3:34 PM  State of Healing Early/partial granulation 01/07/2019  3:34 PM  Site / Wound Assessment Red;Yellow;Pink 01/07/2019  3:34 PM  % Wound base Red or Granulating 75% 01/07/2019  3:34 PM  % Wound base Yellow/Fibrinous Exudate 25% 01/07/2019  3:34 PM  % Wound base Black/Eschar 0% 01/07/2019  3:34 PM  % Wound base Other/Granulation Tissue (Comment) 0% 01/07/2019  3:34 PM  Peri-wound Assessment Erythema (blanchable) 01/07/2019  3:34 PM  Wound Length (cm) 7.5 cm 01/07/2019  3:34 PM  Wound Width (cm) 7 cm 01/07/2019  3:34 PM  Wound Depth (cm) 0.1 cm 01/07/2019  3:34 PM  Wound Surface Area (cm^2) 52.5 cm^2 01/07/2019  3:34 PM  Wound Volume (cm^3) 5.25 cm^3 01/07/2019  3:34 PM  Tunneling (cm) 0 12/31/2018 10:28 AM  Margins Unattached edges (unapproximated) 01/07/2019  3:34 PM  Drainage Amount Moderate 01/07/2019  3:34 PM  Drainage Description Green;Other (Comment) 01/07/2019  3:34 PM  Treatment Debridement (Selective);Hydrotherapy (Pulse lavage) 01/07/2019  3:34 PM     Hydrotherapy Pulsed lavage therapy - wound location: sacrum Pulsed Lavage  with Suction (psi): 8 psi Pulsed Lavage with Suction - Normal Saline Used: 1000 mL Pulsed Lavage Tip: Tip with splash shield Selective Debridement Selective Debridement - Location: sacrum Selective Debridement - Tools Used: Forceps;Scissors Selective Debridement - Tissue Removed: minimal yellow necrotic tissue   Wound Assessment and Plan  Wound Therapy - Assess/Plan/Recommendations Wound Therapy - Clinical Statement: Bright green/blue exudate; Dakins initiated. Necrotic tissue decreasing. May be able to discharge hydrotherapy Friday. Wound Therapy - Functional Problem List: decr mobility Factors Delaying/Impairing Wound Healing: Diabetes Mellitus;Immobility;Multiple medical problems Hydrotherapy Plan: Debridement;Dressing change;Patient/family education;Pulsatile lavage with suction Wound Therapy - Frequency: 3X / week Wound Therapy - Follow Up Recommendations: Skilled nursing facility Wound Plan: see above  Wound Therapy Goals- Improve the function of patient's integumentary system by progressing the wound(s) through the phases of wound healing (inflammation - proliferation - remodeling) by: Decrease Necrotic Tissue to: 20 Decrease Necrotic Tissue - Progress: Progressing toward goal Increase Granulation Tissue to: 80 Increase Granulation Tissue - Progress: Progressing toward goal Goals/treatment plan/discharge plan were made with and agreed upon by patient/family: Yes Time For Goal Achievement: 7 days Wound Therapy - Potential for Goals: Fair  Goals will be updated until maximal potential achieved or discharge criteria met.  Discharge criteria: when goals achieved, discharge from hospital, MD decision/surgical intervention, no progress towards goals, refusal/missing three consecutive treatments without notification or medical reason.  GP    Antonio [name], PT, DPT Acute Rehabilitation Services Pager [phone] Office [phone]   Antonio [name] 01/07/2019, 3:40 PM

## 2019-01-07 NOTE — Progress Notes (Addendum)
  Speech Language Pathology Treatment: Antonio Antonio [name] Speaking valve  Patient Details Name: Antonio RichardsDaniel Watson Gosdin Jr. MRN: [id number] DOB: December 12, 1955 Today's Date: 01/07/2019 Time: [id number] SLP Time Calculation (min) (ACUTE ONLY): 24 min  Assessment / Plan / Recommendation Clinical Impression  Pt alert with mildly increased spontaneous verbalizations today which revealed confusion. Restraints released after pt requested to wash his Antonio [name]. When arrived he had expectorated large amount of mucous that was resting on chin and chest indicative of cough becoming stronger and more productive. Vocal intensity, quality of phonation and was mildly improved using PMV and he was able to wear longer without signs of air trapping. Although tolerance and respiration improved, still recommending he wear only with SLP due to significant secretions and mildly increased confusion noted today. Getting closer to addressing swallowing- if instrumental assessment was performed now there is a high likelihood he would not be safe and continued NPO would be recommended.    HPI HPI: 63 year old male coming in with fever cough chest pain and shortness of breath was found to have influenza A. Intubated 1/3-1/8. PMH: left BKA with prostheses, diabetes mellitus, hypertension. CXR 1/7 Low volume chest with interstitial coarsening and streaky basilar density. No effusion or pneumothorax.      SLP Plan  Continue with current plan of care       Recommendations         Patient may use Antonio Antonio [name] Speech Valve: with SLP only PMSV Supervision: Full         Oral Care Recommendations: Oral care QID Follow up Recommendations: 24 hour supervision/assistance;Skilled Nursing facility SLP Visit Diagnosis: Aphonia (R49.1) Plan: Continue with current plan of care       GO                Antonio Antonio [name], Antonio Antonio [name] 01/07/2019, 10:57 AM Antonio CoonsLisa Antonio [name] Antonio Antonio [name] M.Ed Nurse, children'sCCC-SLP Speech-Language Pathologist Pager [phone] Office  [phone]

## 2019-01-08 ENCOUNTER — Inpatient Hospital Stay (HOSPITAL_COMMUNITY): Payer: Medicaid Other

## 2019-01-08 DIAGNOSIS — R0989 Other specified symptoms and signs involving the circulatory and respiratory systems: Secondary | ICD-10-CM

## 2019-01-08 LAB — GLUCOSE, CAPILLARY
Glucose-Capillary: 135 mg/dL — ABNORMAL HIGH (ref 70–99)
Glucose-Capillary: 142 mg/dL — ABNORMAL HIGH (ref 70–99)
Glucose-Capillary: 166 mg/dL — ABNORMAL HIGH (ref 70–99)
Glucose-Capillary: 181 mg/dL — ABNORMAL HIGH (ref 70–99)
Glucose-Capillary: 190 mg/dL — ABNORMAL HIGH (ref 70–99)
Glucose-Capillary: 196 mg/dL — ABNORMAL HIGH (ref 70–99)
Glucose-Capillary: 203 mg/dL — ABNORMAL HIGH (ref 70–99)

## 2019-01-08 LAB — BLOOD GAS, ARTERIAL
Acid-Base Excess: 11.3 mmol/L — ABNORMAL HIGH (ref 0.0–2.0)
Bicarbonate: 37.4 mmol/L — ABNORMAL HIGH (ref 20.0–28.0)
Drawn by: 511471
FIO2: 0.8
O2 Saturation: 71.2 %
Patient temperature: 99.6
pCO2 arterial: 74 mmHg (ref 32.0–48.0)
pH, Arterial: 7.327 — ABNORMAL LOW (ref 7.350–7.450)
pO2, Arterial: 44.3 mmHg — ABNORMAL LOW (ref 83.0–108.0)

## 2019-01-08 NOTE — Progress Notes (Signed)
PT Cancellation Note  Patient Details Name: Antonio [name]. MRN: [id number] DOB: Sep 02, 1956   Cancelled Treatment:    Reason Eval/Treat Not Completed: (P) Medical issues which prohibited therapy(Pt with nursing in room after pulling PEG and trach out.  Will defer PT at this time.  )   Florestine Avers 01/08/2019, 3:23 PM  Joycelyn Rua, PTA Acute Rehabilitation Services Pager [phone] Office [phone]

## 2019-01-08 NOTE — Progress Notes (Signed)
Supervising Physician: Richarda Overlie  Patient Status:  Antonio [name] - In-pt  Chief Complaint: Respiratory failure  Subjective: Trach/vent.  RN called to report G-tube dislodged.   Allergies: Iodinated diagnostic agents and Penicillins  Medications: Prior to Admission medications   Medication Sig Start Date End Date Taking? Authorizing Provider  aspirin EC 81 MG tablet Take 81 mg by mouth daily.   Yes [provider]  atorvastatin (LIPITOR) 40 MG tablet Take 1 tablet (40 mg total) by mouth daily at 6 PM. 03/28/17  Yes Howard Pouch, MD  glipiZIDE (GLUCOTROL) 10 MG tablet Take 10 mg by mouth 2 (two) times daily before a meal.   Yes [provider]  Insulin Degludec (TRESIBA) 100 UNIT/ML SOLN Inject 70 Units into the skin at bedtime.    Yes [provider]  insulin glargine (LANTUS) 100 UNIT/ML injection Inject 0.05 mLs (5 Units total) into the skin at bedtime. 03/28/17  Yes Howard Pouch, MD  insulin regular (NOVOLIN R,HUMULIN R) 100 units/mL injection Inject 2-4 Units into the skin as needed for high blood sugar. Only of over 150 mg   Yes [provider]  lisinopril (PRINIVIL,ZESTRIL) 5 MG tablet Take 5 mg by mouth 2 (two) times daily.   Yes [provider]  metoprolol tartrate (LOPRESSOR) 25 MG tablet Take 25 mg by mouth 2 (two) times daily.   Yes [provider]  Multiple Vitamin (MULTIVITAMIN) tablet Take 1 tablet by mouth daily.   Yes [provider]  tapentadol HCl (NUCYNTA) 75 MG tablet Take 75 mg by mouth 4 (four) times daily.    Yes [provider]     Vital Signs: BP 129/75 (BP Location: Left Arm)   Pulse (!) 106   Temp 98.5 F (36.9 C) (Oral)   Resp 20   Ht 6' (1.829 m)   Wt 196 lb 6.9 oz (89.1 kg)   SpO2 95%   BMI 26.64 kg/m   Physical Exam  NAD, restless, combative in restraints Abdomen: soft, non-distended.  G-tube in place with phalange pulled back several cm   Imaging: No results  found.  Labs:  CBC: Recent Labs    01/01/19 0419 01/02/19 0616 01/04/19 0314 01/06/19 0521  WBC 6.8 6.7 6.7 7.3  HGB 8.2* 9.0* 8.8* 9.4*  HCT 24.7* 27.9* 28.9* 29.7*  PLT 258 293 287 323    COAGS: Recent Labs    11/28/18 1658 12/10/18 1242 12/23/18 0531  INR 0.97 1.17 1.26  APTT 31 37*  --     BMP: Recent Labs    01/02/19 0616 01/03/19 0550 01/04/19 0314 01/06/19 0521  NA 136 138 139 144  K 4.6 4.2 4.6 4.1  CL 97* 101 100 101  CO2 25 28 29  34*  GLUCOSE 209* 141* 252* 182*  BUN 35* 41* 34* 45*  CALCIUM 8.8* 8.7* 8.9 9.1  CREATININE 0.72 0.69 0.63 0.71  GFRNONAA >60 >60 >60 >60  GFRAA >60 >60 >60 >60    LIVER FUNCTION TESTS: Recent Labs    12/06/18 0540 12/08/18 0526 12/12/18 0545 12/18/18 0607 12/19/18 0520 01/06/19 0521  BILITOT 0.4 0.5 0.6  --   --  0.3  AST 21 39 22  --   --  21  ALT 23 25 22   --   --  32  ALKPHOS 62 70 82  --   --  88  PROT 5.8* 5.8* 6.2*  --   --  7.6  ALBUMIN 1.6* 1.4* 1.5* 1.4* 1.5* 1.9*  Assessment and Plan: Respiratory failure, dysphagia Patient with long-term care needs is s/p gastrostomy tube placement 12/23/18 in IR.  RN called to report gastrostomy "dislodged." PA to bedside.  Tube moves within tract easily.  Site intact.  Small amount of fresh blood.  Able to easily pull back on tube to re-establish bumper and re-cinch tube to skin without issue. Discussed with RN.  Ok to use tube.   Discussed with Dr. Lowella Dandy.  No need for additional imaging at this time.   Electronically Signed: Hoyt Koch, PA 01/08/2019, 3:46 PM   I spent a total of 15 Minutes at the the patient's bedside AND on the patient's hospital floor or unit, greater than 50% of which was counseling/coordinating care for respiratory failure, dysphagia.

## 2019-01-08 NOTE — Progress Notes (Signed)
PCCM INTERVAL PROGRESS NOTE  Called to bedside to evaluate for dyspnea. Briefly this is a 63 year old male who has had a complicated course after ICU admission for Flu A requiring intubation. Difficulty weaning requiring tracheostomy. He had issues with agitation, but has improved and was transferred out to the floor. Downsized to a 4 cuffless earlier today. Now being called after he has had copious secretions several episodes of mucous plugging.   ABG    Component Value Date/Time   PHART 7.327 (L) 01/08/2019 2318   PCO2ART 74.0 (HH) 01/08/2019 2318   PO2ART 44.3 (L) 01/08/2019 2318   HCO3 37.4 (H) 01/08/2019 2318   TCO2 25 12/07/2018 0349   ACIDBASEDEF 3.0 (H) 12/07/2018 0349   O2SAT 71.2 01/08/2019 2318    Plan: Will evaluate with emergent bedside bronchoscopy Transfer to ICU May need to be intubated to allow [place] to change out tracheostomy so he can be placed on vent.    Joneen Roach, AGACNP-BC [facility] Pulmonary/Critical Care Pager [phone] or [phone]  01/08/2019 11:41 PM

## 2019-01-08 NOTE — Progress Notes (Addendum)
  Speech Language Pathology Treatment: (P) Passy Muir Speaking valve  Patient Details Name: Antonio [name]. MRN: [id number] DOB: 07/15/1956 Today's Date: 01/08/2019 Time: 0912-0933 SLP Time Calculation (min) (ACUTE ONLY): 21 min  Assessment / Plan / Recommendation Clinical Impression  On SLP arrival, pt was sideways in bed and appeared agitated.  NT arrived and assisted with repositioning and called RN.  Wrist restraints were placed.  Pt remained confused throughout session and repeatedly requested that SLP assist in unloading truck.  Pt has size 3mm Shiley cuffless trach and is on 10 L O2 at 40% FiO2.  Thick secretions noted outside trach hub. With initial placement of PMV SpO2 dropped from 95 to 83; however, pt appeared in no distress and there was no backpressure on removal of PMV.  Pt tolerated placement of PMV for 15 minutes with slight decreases in SpO2 intermittently with recovery to 93-95.  Pt was able to bring secretions to oral cavity intermittently at first and consistently after PO trials of ice chips and small amounts of thin liquid.  Pt achieved minimal phonation with maximum encouragement.  Vocal quality remained fairly wet throughout session.  As PMSV may assist in clearance of secretions and assist in working to decannulation.  Pt may wear SpO2 with staff with 100% supervision.  Please remove speaking valve when leaving room, if pt is in respiratory distress,  or if SpO2 drops greater than 5%.  SLP will continue to follow.    HPI HPI: 63 year old male coming in with fever cough chest pain and shortness of breath was found to have influenza A. PMH: diabetes mellitus, hypertension.  Intubated multiple times 1/3 to 1/16 with trach placed 1/16.  Currently with size 52mm Cuffless trach with plans to downsize to #4 and work toward decannulation.       SLP Plan  Continue with current plan of care       Recommendations  Diet recommendations: NPO      Patient may use  Passy-Muir Speech Valve: Intermittently with supervision(1:1 supervision with staff) PMSV Supervision: Full         SLP Visit Diagnosis: Aphonia (R49.1);Dysphagia, oropharyngeal phase (R13.12) Plan: Continue with current plan of care       GO                Kerrie Pleasure, MA, CCC-SLP Acute Rehabilitation Services Office: [phone]; Pager: [phone] 01/08/2019, 10:28 AM

## 2019-01-08 NOTE — Progress Notes (Addendum)
NAME:  Antonio [name]., MRN:  [id number], DOB:  02/20/1956, LOS: 59 ADMISSION DATE:  11/28/2018, CONSULTATION DATE:  1/3 REFERRING MD:  EDP, CHIEF COMPLAINT:  Dyspnea   Brief History   63 year old male with influenza A causing respiratory failure and cardiogenic shock. Prolonged mechanical ventilation, tracheostomy placed on January 16.  Past Medical History  DM2, CAD, HTN  Significant Hospital Events   1/6 self extubated 1/7 re-intubated 1/8 self extubated 1/9 acute respiratory failure, mucus plugging right lower lobe, re-intubated, bronchoscopy 1/10 New fever T Max 103.1, antibiotics resumed with ceftaz and vancomycin 1/11: Hemodynamically seems to be improving. Very hypernatremic. X-ray improving. Adding free water, one-time Lasix, follow-up chemistry a.m. Hoping to initiate weaning efforts on 1/12 1/16. Continuing diuresis and ongoing ventilatory support. Working on weaning but still has significant work of breathing. Plan has been to proceed with tracheostomy. Hopefully we can continue ongoing diuresis and continue to decrease sedation 1/20 still struggling to wean. Secretions and anxiety are barriers.  1/21 trach revision to 6 distal XLT, fever, increased secretions, abx broadened / bronch 1/22 ongoing thick secretions 1/24 Failed SBT, low grade fever, Per CXR persistent pleural effusions / basilar consolidation atelectasis vs pneumonia per bases 1/25>> Mucus plugged with slow recovery after chest PT and Wound Care 2/3 > first time weaned for greater than 15 minutes (previously failed due to agitation, secretions, tachypnea)  Consults:  PCCM Cardiology   Procedures:  ETT 1/3 >>1/6, 1/7 > 1/8> 1/9> 1/16 1/5 Right TL PICC >> 2/4 1/9 Bronch> Mucus plugging RLL Trach 1/16 (JY) >1/20 trach change failed and #4 cuffed 1/21 trach upsized to 6 cuffed XLT distal > Down size to 4 cuffless 2/13 1/17 right nare cortrak > 1/29 1/28 gastrostomy tube >    Significant  Diagnostic Tests:  UDS 1/4 >negative  ECHO 1/4 >LVEF 20-25%, akinesis of the anteroseptal, anterolateral & apical myocardium, LVEF 76-28%, grade 1 diastolic dysfunction, small pericardial effusion without evidence of hemodynamic compromise. CT Chest / ABD / Pelvis 1/3 > airway thickening, motion artifact, CAD Echo 1/29> LVEF 60-65%, left ventricular hypertrophy, trivial pericardial effusion  Micro Data:  RVP 1/3 > positive for influenza A BCx2 1/3 >No growth UC 1/4 >No Growth Sputum Cx 1/7 > Normal respiratory flora BAL >corynebacterium  Blood Cx 1/10 > neg Sputum 1/21 > abundant corynebacterium striatum  Antimicrobials:  Vanco 1/3 x1;    Zosyn 1/30 x1  Tamiflu 1/3 >1/8 1/10 vancomycin>1/12 1/10 ceftaz >1/12;   1/21 >1/22 1/12 emycin > 1/22 1/23 cefepime > 1/24 1/21 Vanc> 1/28  Interim history/subjective:  No events overnight.   Objective   Blood pressure 130/75, pulse 100, temperature 98.8 F (37.1 C), temperature source Axillary, resp. rate 20, height 6' (1.829 m), weight 89.1 kg, SpO2 95 %.    FiO2 (%):  [40 %-60 %] 40 %   Intake/Output Summary (Last 24 hours) at 01/08/2019 1131 Last data filed at 01/08/2019 0630 Gross per 24 hour  Intake 2141.34 ml  Output 900 ml  Net 1241.34 ml   Filed Weights   01/05/19 0500 01/06/19 0500 01/07/19 0600  Weight: 88.5 kg 86.8 kg 89.1 kg    Physical Exam: General: Adult male, no distress  HENT: [place]/AT, PERRL, EOM-I and MMM Neck: Trach in place and midline Respiratory: Rhonchi, non-labored, no wheeze  Cardiovascular: RRR, Nl S1/S2 and -M/R/G GI: Soft, NT, ND and +BS, peg tube in place  Extremities:L BKA, partial amputation of R foot Neuro: Alert and oriented, follows commands  Skin:  warm, dry, pressure ulcer noted to sacrum   Resolved Hospital Problem list   AKI Pneumonia Hypokalemia Hypernatremia  Assessment & Plan:   Acute respiratory failure with hypoxemia Prolonged respiratory failure after flu a infection,  Corynebacterium pneumonia. S/p tracheostomy Plan Change to 4 cuffless this AM  Hopefully to attempt CAP trail soon > Currently with thick, copious secretions  Chest PT 4 times daily  Routine trach care Titrate O2 for sat of 88-92% SLP Diureses per primary PT/OT/Speech  Trach management plan as above  Hayden Pedro, AGACNP-BC [facility]  Pgr: [phone]  PCCM Pgr: [phone]  Attending Note:  63 year old male with PMH of DM with multiple complications who presents with flu A infection and associated respiratory failure.  Patient was intubated and subsequently trached.  PCCM is following for trach management.  On exam, he is breathing comfortably with coarse BS diffusely and increased secretions.  I reviewed CXR myself, trach is in a good position.  Discussed with PCCM-NP.  Hypoxemia:  - Titrate O2 for sat of 88-92% via TC  Trach status:  - Change to a cuffless shiley 4 today  - No capping at this point yet  - Will re-evaluate again in AM  Increased secretion:  - Mobilize as able  - Minimize suction as able and allow for patient to expectorate  PCCM will continue to follow  Patient seen and examined, agree with above note.  I dictated the care and orders written for this patient under my direction.  Rush Farmer, [place]

## 2019-01-08 NOTE — Progress Notes (Signed)
PROGRESS NOTE    Antonio [name].  JTT:[id number] DOB: [birth year] DOA: 11/28/2018 PCP: Imagene Riches, NP   Brief Narrative:  63 year old with past medical history relevant for type 2 diabetes on insulin, hypertension, hyperlipidemia, 3 of coronary artery disease admitted on 11/29/2018 with acute hypoxic respiratory failure secondary to influenza A with prolonged hospitalization and failing from weaning from ventilator complicated by thick secretions, corynebacterium infection, substance-induced cardiomyopathy that resolved currently pending placement.   Assessment & Plan:   Active Problems:   Chest pain   SIRS (systemic inflammatory response syndrome) (HCC)   Acute respiratory failure with hypoxemia (HCC)   Pressure injury of skin   Influenza A virus present   Pneumonia   Tracheostomy status (HCC)   Acute pulmonary edema (HCC)   Hypoxia   Fever, unspecified   Tachycardia   Acute respiratory failure with hypoxia (HCC)   Hypoxemia   #) Chronic hypoxic respiratory failure: Due to influenza A and corynebacterium infection.  Complicated by heavy secretions. -Trach care per PCCM, plan to continue to downsize and possibly Over the weekend and possibly even decannulate on Monday which is exceptional! -Speech-language pathology  #) Agitation/psych: These episodes of agitation pulling the tracheostomy site appear to be primarily behavioral now rather than true altered mental status. -Continue quetiapine 100 mg twice daily - 0.5 mg twice daily  #) Acute systolic heart failure/history of coronary artery disease: Likely secondary to sepsis induced depression of EF.  On review the chart patient apparently had an abnormal micro-myocardial perfusion test but did not follow-up with cardiology.  Patient is never had a cardiac cath and no plans per cardiology -EF improved on repeat echo and goal-directed therapy - Continue carvedilol 6.25 mg twice daily -Continue losartan 5 mg  daily -Continue aspirin 81 mg -Continue statin  #) Hyperlipidemia: -Continue atorvastatin 40 mg daily  #) Type 2 diabetes: - Sliding scale insulin, every 4 hours - Continue detemir 50 units twice daily  #) Status post PEG tube: -Continue vital 1.5-calorie 55 mils an hour -Continue feeding supplementations -Continue free water  Fluids: Per above Electrolytes: Monitor and supplement Nutrition: Per above  Prophylaxis: Enoxaparin   Disposition: Pending skilled nursing facility evaluation  Full code  Consultants:   PCCM  Cardiology  Procedures:  1/4 intubation 1/4 echo: - Left ventricle: The cavity size was normal. Wall thickness was   normal. Systolic function was severely reduced. The estimated   ejection fraction was in the range of 20% to 25%. There is   akinesis of the anteroseptal, anterolateral, and apical   myocardium. Doppler parameters are consistent with abnormal left   ventricular relaxation (grade 1 diastolic dysfunction). - Pericardium, extracardiac: A small pericardial effusion was   identified circumferential to the heart. The fluid exhibited a   fibrinous appearance.There was no evidence of hemodynamic   compromise.   1/6 self extubated  1/7 re-intubated  1/8 self extubated 1/9 acute respiratory failure, mucus plugging right lower lobe, re-intubated, bronchoscopy 1/16 Tracheostomy placed 1/21 trach revision to 6 distal XLT, repeat bronch 1/29 echo: 1. The left ventricle appears to be normal in size, has mild wall thickness 60-65% ejection fraction Spectral Doppler shows impaired relaxation pattern of diastolic filling.  2. The right ventricle has normal size and normal systolic function.  3. Normal left atrial size.  4. Normal right atrial size.  5. The pericardial effusion is globally pericardial effusion.  6. Trivial pericardial effusion, as described above.  7. The mitral valve normal in structure and  function.  8. Normal tricuspid valve.  9.  Aortic valve normal. 10. No atrial level shunt detected by color flow Doppler. 2/10 Transferred to [facility] 2/11 trach changed to #5 cuffless  Antimicrobials:  Vanco 1/3 x1 Zosyn 1/30 x1  Tamiflu 1/3 >1/8 1/10 vancomycin>1/12 1/10 ceftaz >1/12; 1/21 >>1/22 1/12 emycin > 1/22 1/23 cefepime >> 1/24 1/21 Vanc> 1/28    Subjective: This morning the patient reports he is doing well.  He is less frustrated and agitated than yesterday.  He is requesting some water by mouth.  He denies any nausea, vomiting, diarrhea, cough, congestion, rhinorrhea.  Objective: Vitals:   01/08/19 0446 01/08/19 0736 01/08/19 0749 01/08/19 1041  BP: 127/89  111/76 130/75  Pulse: 95 (!) 102 100 90  Resp: '20 20 18   ' Temp: 98.7 F (37.1 C)  98.8 F (37.1 C)   TempSrc: Axillary  Axillary   SpO2: 98% 96% 97%   Weight:      Height:        Intake/Output Summary (Last 24 hours) at 01/08/2019 1043 Last data filed at 01/08/2019 0630 Gross per 24 hour  Intake 2141.34 ml  Output 900 ml  Net 1241.34 ml   Filed Weights   01/05/19 0500 01/06/19 0500 01/07/19 0600  Weight: 88.5 kg 86.8 kg 89.1 kg    Examination:  General exam: Appears calm and comfortable  Respiratory system: Transmitted upper airway sounds, no tachypnea, scattered rhonchi, no wheezes Cardiovascular system: Regular rate and rhythm, no murmurs Gastrointestinal system: Soft, nondistended, no rebound or guarding, plus bowel sounds Central nervous system: Alert and oriented.  Grossly intact, moving all extremities Extremities: No lower extremity edema.  Left BKA Skin: Tracheostomy site with numerous secretions, PEG tube site is clean dry and intact Psychiatry: Judgement and insight appear normal. Mood & affect upset    Data Reviewed: I have personally reviewed following labs and imaging studies  CBC: Recent Labs  Lab 01/02/19 0616 01/04/19 0314 01/06/19 0521  WBC 6.7 6.7 7.3  HGB 9.0* 8.8* 9.4*  HCT 27.9* 28.9* 29.7*  MCV 85.3 87.0  87.6  PLT 293 287 322   Basic Metabolic Panel: Recent Labs  Lab 01/02/19 0616 01/03/19 0550 01/04/19 0314 01/06/19 0521  NA 136 138 139 144  K 4.6 4.2 4.6 4.1  CL 97* 101 100 101  CO2 '25 28 29 ' 34*  GLUCOSE 209* 141* 252* 182*  BUN 35* 41* 34* 45*  CREATININE 0.72 0.69 0.63 0.71  CALCIUM 8.8* 8.7* 8.9 9.1  MG 1.9 2.2 2.1  --   PHOS 4.1 3.9 5.4*  --    GFR: Estimated Creatinine Clearance: 107.8 mL/min (by C-G formula based on SCr of 0.71 mg/dL). Liver Function Tests: Recent Labs  Lab 01/06/19 0521  AST 21  ALT 32  ALKPHOS 88  BILITOT 0.3  PROT 7.6  ALBUMIN 1.9*   No results for input(s): LIPASE, AMYLASE in the last 168 hours. Recent Labs  Lab 01/06/19 0521  AMMONIA 19   Coagulation Profile: No results for input(s): INR, PROTIME in the last 168 hours. Cardiac Enzymes: No results for input(s): CKTOTAL, CKMB, CKMBINDEX, TROPONINI in the last 168 hours. BNP (last 3 results) No results for input(s): PROBNP in the last 8760 hours. HbA1C: No results for input(s): HGBA1C in the last 72 hours. CBG: Recent Labs  Lab 01/07/19 1625 01/07/19 1943 01/08/19 0005 01/08/19 0427 01/08/19 0744  GLUCAP 190* 164* 166* 142* 135*   Lipid Profile: No results for input(s): CHOL, HDL, LDLCALC, TRIG, CHOLHDL,  LDLDIRECT in the last 72 hours. Thyroid Function Tests: No results for input(s): TSH, T4TOTAL, FREET4, T3FREE, THYROIDAB in the last 72 hours. Anemia Panel: Recent Labs    01/06/19 0521  VITAMINB12 874  FOLATE 34.5   Sepsis Labs: No results for input(s): PROCALCITON, LATICACIDVEN in the last 168 hours.  Recent Results (from the past 240 hour(s))  MRSA PCR Screening     Status: None   Collection Time: 01/04/19  9:26 PM  Result Value Ref Range Status   MRSA by PCR NEGATIVE NEGATIVE Final    Comment:        The GeneXpert MRSA Assay (FDA approved for NASAL specimens only), is one component of a comprehensive MRSA colonization surveillance program. It is  not intended to diagnose MRSA infection nor to guide or monitor treatment for MRSA infections. Performed at [facility], [place] [address], [place], [place] [address]          Radiology Studies: No results found.      Scheduled Meds: . aspirin  81 mg Per Tube Daily  . atorvastatin  40 mg Per Tube q1800  . carvedilol  6.25 mg Per Tube BID WC  . chlorhexidine gluconate (MEDLINE KIT)  15 mL Mouth Rinse BID  . clonazePAM  0.5 mg Per Tube BID  . enoxaparin (LOVENOX) injection  40 mg Subcutaneous Q24H  . famotidine  20 mg Per Tube BID  . feeding supplement (PRO-STAT SUGAR FREE 64)  60 mL Per Tube BID  . fentaNYL  75 mcg Transdermal Q72H  . folic acid  1 mg Per Tube Daily  . guaiFENesin  5 mL Per Tube Q12H  . insulin aspart  0-20 Units Subcutaneous Q4H  . insulin detemir  50 Units Subcutaneous BID  . ipratropium-albuterol  3 mL Nebulization Q6H  . losartan  25 mg Per Tube Daily  . mouth rinse  15 mL Mouth Rinse QID  . multivitamin  15 mL Per Tube Daily  . nutrition supplement (JUVEN)  1 packet Per Tube BID BM  . QUEtiapine  100 mg Per Tube BID  . sodium hypochlorite   Irrigation Daily  . thiamine  100 mg Per Tube Daily   Continuous Infusions: . sodium chloride Stopped (12/30/18 0849)  . feeding supplement (VITAL 1.5 CAL) 1,000 mL (01/06/19 1534)     LOS: 41 days    Time spent: [place], MD [facility]  If 7PM-7AM, please contact night-coverage [email or web address] Password [facility] 01/08/2019, 10:43 AM

## 2019-01-08 NOTE — Procedures (Signed)
Tracheostomy Change Note  Patient Details:   Name: Antonio [name]. DOB: [birth year] MRN: [id number]    Airway Documentation:     Evaluation  O2 sats: stable throughout Complications: No apparent complications Patient did tolerate procedure well. Bilateral Breath Sounds: Rhonchi     RT along with Ayoob Clemons, RRT changed patient's trach from a #5 Shiley to #4 Shiley cuffless with no apparent complications. Positive color change was seen on end tidal CO2. Vitals are stable. RT will continue to monitor.  Rox Mcgriff Lajuana Ripple 01/08/2019, 11:28 AM

## 2019-01-08 NOTE — Progress Notes (Addendum)
  Speech Language Pathology Treatment:  Dysphagia  Patient Details Name: Antonio [name]. MRN: [id number] DOB: 1956-03-15 Today's Date: 01/08/2019 Time: 1093-2355 SLP Time Calculation (min) (ACUTE ONLY): 15 min  Assessment / Plan / Recommendation Clinical Impression  Pt exhibited reduced labial and lingual strength and ROM on OME.  Pt eager for POs and exhibited good oral response to ice chips. There was delayed cough with ice chip.  With Thi liquid by cup therew as immediate wet cough.  Cough decreased but still present with thin liquid by spoon.  Cough became increasingly productive following PO trials with pt bringing up thick secretions to oral cavity consistently which SLP removed with suction. PO trials may have been helpful in loosening secretions for pt to expectorate.  Discussed need for further instrumental assessment with pt prior to initiation of PO diet and procedures for MBSS and FEES.  Pt declined further assessment at this time.    HPI HPI: 63 year old male coming in with fever cough chest pain and shortness of breath was found to have influenza A. PMH: diabetes mellitus, hypertension.  Intubated multiple times 1/3 to 1/16 with trach placed 1/16.  Currently with size 6 Cuffless trach with plans to downsize to #4 and work toward decannulation.       SLP Plan  Continue with current plan of care       Recommendations  Diet recommendations: NPO      Patient may use Passy-Muir Speech Valve: Intermittently with supervision(1:1 supervision with staff) PMSV Supervision: Full         SLP Visit Diagnosis: Aphonia (R49.1);Dysphagia, oropharyngeal phase (R13.12) Plan: Continue with current plan of care       GO                Kerrie Pleasure, MA, CCC-SLP Acute Rehabilitation Services Office: [phone]; Pager (2/13): [phone] 01/08/2019, 10:16 AM

## 2019-01-08 NOTE — Progress Notes (Signed)
Patient pulled out his trach, and dislodged his PEG tube. O2 stats were maintained in the 90's while trach was out. Tube feeding turned off. Respiratory notified and trach (shiley #4) replaced. Dr. Clearnce SorrelPurohit notified. Dr. Clearnce SorrelPurohit states someone from IR will come and assess the PEG tube before continued use. Bilateral soft wrist restraints applied at this time.

## 2019-01-09 ENCOUNTER — Inpatient Hospital Stay (HOSPITAL_COMMUNITY): Payer: Medicaid Other

## 2019-01-09 LAB — BLOOD GAS, ARTERIAL
ACID-BASE EXCESS: 12 mmol/L — AB (ref 0.0–2.0)
Bicarbonate: 36.1 mmol/L — ABNORMAL HIGH (ref 20.0–28.0)
DRAWN BY: 252031
FIO2: 40
MECHVT: 620 mL
O2 Saturation: 88 %
PATIENT TEMPERATURE: 98.6
PCO2 ART: 47.4 mmHg (ref 32.0–48.0)
PEEP: 5 cmH2O
RATE: 20 resp/min
pH, Arterial: 7.494 — ABNORMAL HIGH (ref 7.350–7.450)
pO2, Arterial: 52 mmHg — ABNORMAL LOW (ref 83.0–108.0)

## 2019-01-09 LAB — GLUCOSE, CAPILLARY
GLUCOSE-CAPILLARY: 138 mg/dL — AB (ref 70–99)
Glucose-Capillary: 84 mg/dL (ref 70–99)
Glucose-Capillary: 63 mg/dL — ABNORMAL LOW (ref 70–99)
Glucose-Capillary: 135 mg/dL — ABNORMAL HIGH (ref 70–99)
Glucose-Capillary: 103 mg/dL — ABNORMAL HIGH (ref 70–99)
Glucose-Capillary: 80 mg/dL (ref 70–99)
Glucose-Capillary: 144 mg/dL — ABNORMAL HIGH (ref 70–99)

## 2019-01-09 LAB — TROPONIN I
TROPONIN I: 0.04 ng/mL — AB (ref <0.03)
Troponin I: 0.03 ng/mL (ref <0.03)

## 2019-01-09 LAB — COMPREHENSIVE METABOLIC PANEL
ALT: 30 U/L (ref 0–44)
ANION GAP: 8 (ref 5–15)
AST: 17 U/L (ref 15–41)
Albumin: 2.1 g/dL — ABNORMAL LOW (ref 3.5–5.0)
Alkaline Phosphatase: 75 U/L (ref 38–126)
BUN: 60 mg/dL — ABNORMAL HIGH (ref 6–20)
CO2: 34 mmol/L — ABNORMAL HIGH (ref 22–32)
Calcium: 9.4 mg/dL (ref 8.9–10.3)
Chloride: 108 mmol/L (ref 98–111)
Creatinine, Ser: 1.16 mg/dL (ref 0.61–1.24)
GFR calc Af Amer: >60 mL/min (ref >60)
GFR calc non Af Amer: >60 mL/min (ref >60)
Glucose, Bld: 128 mg/dL — ABNORMAL HIGH (ref 70–99)
POTASSIUM: 4.6 mmol/L (ref 3.5–5.1)
Sodium: 150 mmol/L — ABNORMAL HIGH (ref 135–145)
Total Bilirubin: 0.2 mg/dL — ABNORMAL LOW (ref 0.3–1.2)
Total Protein: 7.6 g/dL (ref 6.5–8.1)

## 2019-01-09 LAB — CBC
HCT: 33 % — ABNORMAL LOW (ref 39.0–52.0)
Hemoglobin: 10 g/dL — ABNORMAL LOW (ref 13.0–17.0)
MCH: 27.2 pg (ref 26.0–34.0)
MCHC: 30.3 g/dL (ref 30.0–36.0)
MCV: 89.9 fL (ref 80.0–100.0)
Platelets: 342 10*3/uL (ref 150–400)
RBC: 3.67 MIL/uL — ABNORMAL LOW (ref 4.22–5.81)
RDW: 14.3 % (ref 11.5–15.5)
WBC: 8.9 10*3/uL (ref 4.0–10.5)
nRBC: 0 % (ref 0.0–0.2)

## 2019-01-09 LAB — MAGNESIUM: Magnesium: 2.4 mg/dL (ref 1.7–2.4)

## 2019-01-09 LAB — LACTIC ACID, PLASMA
Lactic Acid, Venous: 1.2 mmol/L (ref 0.5–1.9)
Lactic Acid, Venous: 1.3 mmol/L (ref 0.5–1.9)

## 2019-01-09 LAB — BRAIN NATRIURETIC PEPTIDE: B NATRIURETIC PEPTIDE 5: 65 pg/mL (ref 0.0–100.0)

## 2019-01-09 MED ORDER — VITAL AF 1.2 CAL PO LIQD
1000.0000 mL | ORAL | Status: AC
  Administered 2019-01-09 – 2019-01-26 (×20): 1000 mL
  Filled 2019-01-09 (×9): qty 1000

## 2019-01-09 MED ORDER — MIDAZOLAM HCL 2 MG/2ML IJ SOLN
INTRAMUSCULAR | Status: AC
  Filled 2019-01-09: qty 2

## 2019-01-09 MED ORDER — FENTANYL CITRATE (PF) 100 MCG/2ML IJ SOLN
INTRAMUSCULAR | Status: AC
  Filled 2019-01-09: qty 2

## 2019-01-09 MED ORDER — FENTANYL CITRATE (PF) 100 MCG/2ML IJ SOLN
100.0000 ug | INTRAMUSCULAR | Status: DC | PRN
  Administered 2019-01-12 – 2019-01-13 (×4): 100 ug via INTRAVENOUS
  Administered 2019-01-14: 50 ug via INTRAVENOUS
  Administered 2019-01-14 – 2019-01-20 (×36): 100 ug via INTRAVENOUS
  Filled 2019-01-09 (×42): qty 2

## 2019-01-09 MED ORDER — FENTANYL CITRATE (PF) 100 MCG/2ML IJ SOLN
100.0000 ug | INTRAMUSCULAR | Status: DC | PRN
  Administered 2019-01-09 – 2019-01-10 (×2): 100 ug via INTRAVENOUS
  Filled 2019-01-09 (×2): qty 2

## 2019-01-09 MED ORDER — DEXTROSE 50 % IV SOLN
INTRAVENOUS | Status: AC
  Administered 2019-01-09: 50 mL
  Filled 2019-01-09: qty 50

## 2019-01-09 MED ORDER — LACTATED RINGERS IV BOLUS
500.0000 mL | Freq: Once | INTRAVENOUS | Status: AC
  Administered 2019-01-09: 500 mL via INTRAVENOUS

## 2019-01-09 MED ORDER — DEXMEDETOMIDINE HCL IN NACL 400 MCG/100ML IV SOLN
0.4000 ug/kg/h | INTRAVENOUS | Status: DC
  Administered 2019-01-09: 0.4 ug/kg/h via INTRAVENOUS

## 2019-01-09 NOTE — Progress Notes (Signed)
SLP Cancellation Note  Patient Details Name: Antonio [name]. MRN: [id number] DOB: 1956/10/29   Cancelled treatment:        Medical decline. Now with cuffed #6 trach on the vent. Will follow   Royce Macadamia 01/09/2019, 10:42 AM  Breck Coons Lonell Face.Ed Nurse, children's [phone] Office [phone]

## 2019-01-09 NOTE — Progress Notes (Signed)
This nurse went into patient's room during hourly round and patient oxygen saturation was in the low 80's. This nurse attempted to suction patient and continue to meet resistance, patient was diaphoretic with respiratory rate above 30's. Respiratory therapies was paged for close monitoring/care, patient blood glucose at this time was 203, with other vital signs within normal limit. Rapid response/doctor on call paged and other emergency care was initiated. Patient was transfered to ICU for further management

## 2019-01-09 NOTE — Significant Event (Addendum)
Rapid Response Event Note  Overview: Respiratory Distress  Initial Focused Assessment: I was called by Charge RT about patient having ongoing hypoxia and respiratory distress. RT asked if they could obtain an ABG, I asked that they obtain the ABG, since the hypoxia was new and went to see the patient.   Upon arrival, patient was very tachypneic, RR in the 40s, + use accessory muscles, profusely diaphoretic. Patient was alert and followed some commands but was not at his baseline (usually alert, agitated, and more interactive per staff). Lung sounds present bilaterally but very minimally air movement. Oxygen saturations were in the mid 60s, patient was bagged via BVM and saturations improved, per RT/RN patient was already bagged x 3 prior to now and lavaged. RT met some resistance when suctioning down the trach.   Interventions: -- STAT CXR -- STAT ABG  -- I paged [place] NP [place] [phone] at and he came to the bedside at 2320. I also called [facility] and spoke with Dr. Prudencio Burly and requested that the ICU ground team come and re-evaluate the patient.  -- ICU ground team came, patient was again hypoxic, bagged again, and decision was made to do an emergent bronchoscopy at the bedside. Patient was placed on continuous telemetry, continuous pulse-oximetry, and BP were monitored. HR in the upper 90s SR, SBP > 110, saturations were maintaining in the mid 90s while patient was bagged and was being preoxygenated with 100% FiO2 as well. Time Out was done. Bronchoscopy was started, scope was not able to pass completely (see procedure note). Decision made to transfer patient to ICU.  -- Patient's saturations maintained > 94% with 100% FiO2 via BVM and patient was transferred to 2M11. Lurline Idol was changed out to [facility] Cuffed # 6 in the MICU.   Plan of Care: -- Transferred to 2M11.  Event Summary:   at    Call Time 2310 Arrival Time 2312 [facility] NP Arrival Time 2320 End Time 0005  Ardyn Forge R

## 2019-01-09 NOTE — Progress Notes (Signed)
Occupational Therapy Treatment Patient Details Name: Antonio [name]. MRN: [id number] DOB: 02-May-1956 Today's Date: 01/09/2019    History of present illness 63 yo admitted with flu A and PNA with acute respiratory failure intubated 1/4, trach 1/16 with course complicated by acute MI, CHF and cardiogenic shock and mucous plugging. Transferred to ICU late 2/13/early 2/14 due to dyspnea and drop in sats with thick mucous.PMhx: DM, HTN, HLD, CAD, Lt BKA, rt toe amputation   OT comments  This 63 yo male admitted with above presents to acute OT wanting to get up to [facility] but then delaying process due to not wanting prothesis on, then wanting prothesis on without sleeve, then agreeing to put sleeve and prothesis on. Pt getting agitated at times when he doesn't want A (flailing his hand at [place]). Once prothesis on pt with Mod A sit<>stand with +2 for lines and stand pivot with Mod A +2 for A and lines. He will continue to benefit from acute OT with follow up at [facility].  Follow Up Recommendations  LTACH    Equipment Recommendations  Other (comment)(TBD at next venue)    Recommendations for Other Services Rehab consult    Precautions / Restrictions Precautions Precautions: Fall Precaution Comments: trach, L BKA, PEG Required Braces or Orthoses: Other Brace Other Brace: LLE prothesis Restrictions Weight Bearing Restrictions: No       Mobility Bed Mobility Overal bed mobility: Needs Assistance Bed Mobility: Supine to Sit;Sit to Supine;Rolling Rolling: Mod assist(increased time)   Supine to sit: Mod assist Sit to supine: Mod assist(mainly A for legs)      Transfers Overall transfer level: Needs assistance Equipment used: 2 person hand held assist;1 person hand held assist Transfers: Sit to/from [employer] Sit to Stand: Mod assist;+2 safety/equipment Stand pivot transfers: Mod assist;+2 physical assistance       General transfer comment: Therapist standing in  front of him for transfers    Balance Overall balance assessment: Needs assistance Sitting-balance support: No upper extremity supported;Feet supported Sitting balance-Leahy Scale: Fair Sitting balance - Comments: min guard for safety at EOB   Standing balance support: Bilateral upper extremity supported;During functional activity Standing balance-Leahy Scale: Poor                             ADL either performed or assessed with clinical judgement   ADL Overall ADL's : Needs assistance/impaired                       Lower Body Dressing Details (indicate cue type and reason): total A to don RLE prothesis Toilet Transfer: Moderate assistance;+2 for safety/equipment;Stand-pivot;BSC Toilet Transfer Details (indicate cue type and reason): Sit>stand +1 Mod A Toileting- Clothing Manipulation and Hygiene: Total assistance Toileting - Clothing Manipulation Details (indicate cue type and reason): Mod A sit<>stand             Vision Patient Visual Report: No change from baseline            Cognition Arousal/Alertness: Awake/alert Behavior During Therapy: Flat affect(agitated at times when he didn't want our help) Overall Cognitive Status: Impaired/Different from baseline Area of Impairment: Following commands;Safety/judgement;Problem solving;Attention                   Current Attention Level: Sustained   Following Commands: Follows one step commands with increased time Safety/Judgement: Decreased awareness of safety;Decreased awareness of deficits   Problem Solving: Slow processing;Decreased  initiation;Difficulty sequencing;Requires verbal cues;Requires tactile cues General Comments: Pt stating he needed "to shit", brought out [facility] and started helping don LLE prothesis (he let me put neoprene sleeve on put then wouldn't let me put on his prothesis). We tried to transfer without prothesis-but did not work. He then took off LLE sleeve and pointed to his  prothesis wanting to put it on, but without his sleeve. We explained to him that he had to put on sleeve first and he eventually let us do this for him. Perservates on wanting water.                   Pertinent Vitals/ Pain       Pain Assessment: Faces Faces Pain Scale: No hurt         Frequency  Min 2X/week        Progress Toward Goals  OT Goals(current goals can now be found in the care plan section)  Progress towards OT goals: Progressing toward goals     Plan Discharge plan remains appropriate;Frequency remains appropriate    Co-evaluation    PT/OT/SLP Co-Evaluation/Treatment: Yes Reason for Co-Treatment: Complexity of the patient's impairments (multi-system involvement);For patient/therapist safety;To address functional/ADL transfers;Necessary to address cognition/behavior during functional activity PT goals addressed during session: Mobility/safety with mobility;Balance;Strengthening/ROM OT goals addressed during session: ADL's and self-care;Strengthening/ROM      AM-PAC OT "6 Clicks" Daily Activity     Outcome Measure   Help from another person eating meals?: Total Help from another person taking care of personal grooming?: A Lot Help from another person toileting, which includes using toliet, bedpan, or urinal?: A Lot Help from another person bathing (including washing, rinsing, drying)?: A Lot Help from another person to put on and taking off regular upper body clothing?: Total Help from another person to put on and taking off regular lower body clothing?: Total 6 Click Score: 9    End of Session Equipment Utilized During Treatment: Gait belt;Oxygen(vent, FiO 40%)  OT Visit Diagnosis: Unsteadiness on feet (R26.81);Other abnormalities of gait and mobility (R26.89);Muscle weakness (generalized) (M62.81);Other symptoms and signs involving cognitive function   Activity Tolerance Patient tolerated treatment well;Other (comment)(with some limitations due to  intermittent agitation)   Patient Left in bed;with call bell/phone within reach;with bed alarm set(respiratory in room)   Nurse Communication Mobility status(via RN covering for pt's RN on lunch break)        Time: 4332-9518 OT Time Calculation (min): 36 min  Charges: OT General Charges $OT Visit: 1 Visit OT Treatments $Self Care/Home Management : 8-22 mins  Ignacia Palma, OTR/L Acute [employer] Pager [phone] Office [phone]      Evette Georges 01/09/2019, 2:50 PM

## 2019-01-09 NOTE — Progress Notes (Signed)
Nutrition Follow-up  DOCUMENTATION CODES:   Not applicable  INTERVENTION:   Continue TF via PEG:  Change TF formula to Vital AF 1.2 at 75 ml/h (1800 ml per day)  Provides 2160 kcal, 135 gm protein, 1460 ml free water daily  Continue Juven BID and MVI daily.  NUTRITION DIAGNOSIS:   Inadequate oral intake related to inability to eat as evidenced by NPO status.  Ongoing  GOAL:   Patient will meet greater than or equal to 90% of their needs  Met with TF  MONITOR:   PO intake, TF tolerance, Labs, Skin, Weight trends, I & O's  ASSESSMENT:   63 y/o M who presented to [facility] 1/[facility] with fever, cough, chest pain & SOB. Pt found to be Influenza A positive. Developed progressive respiratory distress requiring intubation in the ER.    Patient required trach change last night and was transferred back to the ICU. Currently on vent support.  MV: 12 L/min Temp (24hrs), Avg:98.9 F (37.2 C), Min:98.3 F (36.8 C), Max:99.6 F (37.6 C)   TF off since last night; RN to resume today. Labs reviewed. Sodium 150 (H) CBG's: [phone] Medications reviewed and include folic acid, novolog, levemir, MVI, Juven, thiamine.   Diet Order:   Diet Order            Diet NPO time specified  Diet effective now              EDUCATION NEEDS:   Not appropriate for education at this time  Skin:  Skin Assessment: Skin Integrity Issues: Skin Integrity Issues:: Stage I, Unstageable Stage I: L head Stage II: N/A Unstageable: sacrum  MASD to buttocks and groin  Last BM:  2/13  Height:   Ht Readings from Last 1 Encounters:  11/29/18 6' (1.829 m)    Weight:   Wt Readings from Last 1 Encounters:  01/09/19 91 kg    Ideal Body Weight:  76.1 kg(adjusted for L BKA)  BMI:  Body mass index is 27.21 kg/m.  Estimated Nutritional Needs:   Kcal:  2130  Protein:  120-150 grams  Fluid:  > 2 L/day    Molli Barrows, RD, LDN, [place] Pager [phone] After Hours Pager [phone]

## 2019-01-09 NOTE — Progress Notes (Signed)
Physical Therapy Wound Treatment Patient Details  Name: Antonio [name]. MRN: [id number] Date of Birth: Dec 13, 1955  Today's Date: 01/09/2019 Time: 1194-1740 Time Calculation (min): 47 min  Subjective  Subjective: "I'm sorry," pt referring to removing his vent Patient and Family Stated Goals: Pt on vent Date of Onset: (unknown) Prior Treatments: dressing changes  Pain Score: Pain with pulsatile lavage; Pre medicated  Wound Assessment  Pressure Injury 12/01/18 Unstageable - Full thickness tissue loss in which the base of the ulcer is covered by slough (yellow, tan, gray, green or brown) and/or eschar (tan, brown or black) in the wound bed. sacrum (Active)  Dressing Type Barrier Film (skin prep);ABD 01/09/2019 12:24 PM  Dressing Clean;Dry;Intact;Changed 01/09/2019 12:24 PM  Dressing Change Frequency Daily 01/09/2019 12:24 PM  State of Healing Early/partial granulation 01/09/2019 12:24 PM  Site / Wound Assessment Red;Yellow;Pink 01/09/2019 12:24 PM  % Wound base Red or Granulating 70% 01/09/2019 12:24 PM  % Wound base Yellow/Fibrinous Exudate 30% 01/09/2019 12:24 PM  % Wound base Black/Eschar 0% 01/09/2019 12:24 PM  % Wound base Other/Granulation Tissue (Comment) 0% 01/09/2019 12:24 PM  Peri-wound Assessment Erythema (blanchable) 01/09/2019 12:24 PM  Wound Length (cm) 7.5 cm 01/07/2019  3:34 PM  Wound Width (cm) 7 cm 01/07/2019  3:34 PM  Wound Depth (cm) 0.1 cm 01/07/2019  3:34 PM  Wound Surface Area (cm^2) 52.5 cm^2 01/07/2019  3:34 PM  Wound Volume (cm^3) 5.25 cm^3 01/07/2019  3:34 PM  Tunneling (cm) 0 12/31/2018 10:28 AM  Margins Unattached edges (unapproximated) 01/09/2019 12:24 PM  Drainage Amount Moderate 01/09/2019 12:24 PM  Drainage Description Green 01/09/2019 12:24 PM  Treatment Debridement (Selective);Hydrotherapy (Pulse lavage) 01/09/2019 12:24 PM     Hydrotherapy Pulsed lavage therapy - wound location: sacrum Pulsed Lavage with Suction (psi): 8 psi Pulsed Lavage with Suction -  Normal Saline Used: 1000 mL Pulsed Lavage Tip: Tip with splash shield Selective Debridement Selective Debridement - Location: sacrum Selective Debridement - Tools Used: Forceps;Scissors Selective Debridement - Tissue Removed: yellow necrotic tissue   Wound Assessment and Plan  Wound Therapy - Assess/Plan/Recommendations Wound Therapy - Clinical Statement: Continues with moderate green drainage. Able to create small hole in slough with debridement today and will benefit from continued removal of necrotic/nonviable tissue. Wound Therapy - Functional Problem List: decr mobility Factors Delaying/Impairing Wound Healing: Diabetes Mellitus;Immobility;Multiple medical problems Hydrotherapy Plan: Debridement;Dressing change;Patient/family education;Pulsatile lavage with suction Wound Therapy - Frequency: 3X / week Wound Therapy - Follow Up Recommendations: Skilled nursing facility Wound Plan: see above  Wound Therapy Goals- Improve the function of patient's integumentary system by progressing the wound(s) through the phases of wound healing (inflammation - proliferation - remodeling) by: Decrease Necrotic Tissue to: 20 Decrease Necrotic Tissue - Progress: Progressing toward goal Increase Granulation Tissue to: 80 Increase Granulation Tissue - Progress: Progressing toward goal Goals/treatment plan/discharge plan were made with and agreed upon by patient/family: Yes Time For Goal Achievement: 7 days Wound Therapy - Potential for Goals: Fair  Goals will be updated until maximal potential achieved or discharge criteria met.  Discharge criteria: when goals achieved, discharge from hospital, MD decision/surgical intervention, no progress towards goals, refusal/missing three consecutive treatments without notification or medical reason.  GP    [name], PT, DPT Acute Rehabilitation Services Pager [phone] Office [phone]   [name] 01/09/2019, 12:31 PM

## 2019-01-09 NOTE — Progress Notes (Signed)
RT called by RN for patient desaturation and resistance when suctioning.  RT arrived sats in the low 80's.  RT lavaged patient and suctioned think tan mucous.  Patient has a hx of mucous plugging.  Sat's went up briefly then dropped again to low 80's.  RT bagged patient until sat's went up to 98%.  Resistance met when suctioning.  Rapid response arrived a bronch was performed.  ABG revealed severe hypoxia.  Patient transported to medical ICU and placed on 80% 10 L ATC.  Patient stable at this time.  RT will continue to monitor.

## 2019-01-09 NOTE — Progress Notes (Signed)
PCCM INTERVAL PROGRESS NOTE  Patient has been transferred to ICU  4 cuffless trach has been changed out to 6 cuffed. Cuff is up for patient to be ventilated.  On bronchoscopy, trach tip was facing posterior wall of the trachea. Suspect the length of a 4 is too short for him. Would not recommend a 4 shiley for him in the future.   Plan: - Full vent support - ABG - CXR - VAP bundle  Precedex and PRN fentanyl for RASS 0 to -1.    Joneen Roach, AGACNP-BC [facility] Pulmonary/Critical Care Pager [phone] or [phone]  01/09/2019 1:02 AM

## 2019-01-09 NOTE — Procedures (Signed)
Bronchoscopy Procedure Note Tydre Opalinski [id number] August 14, 1956  Procedure: Bronchoscopy Indications: Diagnostic evaluation of the airways  Procedure Details Consent: Unable to obtain consent because of emergent medical necessity. Time Out: Verified patient identification, verified procedure, site/side was marked, verified correct patient position, special equipment/implants available, medications/allergies/relevent history reviewed, required imaging and test results available.  Performed  In preparation for procedure, patient was given 100% FiO2 and bronchoscope lubricated. Sedation: none  Airway entered and the following bronchi were examined: proximal trachea.  Unable to pass the scope past the tip of the tracheostomy tube as the distal end of the tube was against the posterior tracheal wall. Plan is to remove current tracheostomy tube and insert a longer tube. Procedures performed: None Bronchoscope removed.    Evaluation Hemodynamic Status: BP stable throughout; O2 sats: stable throughout Patient's Current Condition: stable Specimens:  None Complications: No apparent complications Patient did tolerate procedure well.   Elayne Snare 01/09/2019

## 2019-01-09 NOTE — Progress Notes (Signed)
NAME:  Antonio Mirsky., MRN:  [id number], DOB:  [birth year], LOS: 92 ADMISSION DATE:  11/28/2018, CONSULTATION DATE:  1/3 REFERRING MD:  EDP, CHIEF COMPLAINT:  Dyspnea   Brief History   63 year old male with influenza A causing respiratory failure and cardiogenic shock. Prolonged mechanical ventilation, tracheostomy placed on January 16.  Past Medical History  DM2, CAD, HTN  Significant Hospital Events   1/6 self extubated 1/7 re-intubated 1/8 self extubated 1/9 acute respiratory failure, mucus plugging right lower lobe, re-intubated, bronchoscopy 1/10 New fever T Max 103.1, antibiotics resumed with ceftaz and vancomycin 1/11: Hemodynamically seems to be improving. Very hypernatremic. X-ray improving. Adding free water, one-time Lasix, follow-up chemistry a.m. Hoping to initiate weaning efforts on 1/12 1/16. Continuing diuresis and ongoing ventilatory support. Working on weaning but still has significant work of breathing. Plan has been to proceed with tracheostomy. Hopefully we can continue ongoing diuresis and continue to decrease sedation 1/20 still struggling to wean. Secretions and anxiety are barriers.  1/21 trach revision to 6 distal XLT, fever, increased secretions, abx broadened / bronch 1/22 ongoing thick secretions 1/24 Failed SBT, low grade fever, Per CXR persistent pleural effusions / basilar consolidation atelectasis vs pneumonia per bases 1/25>> Mucus plugged with slow recovery after chest PT and Wound Care 2/3 > first time weaned for greater than 15 minutes (previously failed due to agitation, secretions, tachypnea)  Consults:  PCCM Cardiology   Procedures:  ETT 1/3 >>1/6, 1/7 > 1/8> 1/9> 1/16 1/5 Right TL PICC >> 2/4 1/9 Bronch> Mucus plugging RLL Trach 1/16 (JY) >1/20 trach change failed and #4 cuffed 1/21 trach upsized to 6 cuffed XLT distal > Down size to 4 cuffless 2/13 1/17 right nare cortrak > 1/29 1/28 gastrostomy tube >    Significant  Diagnostic Tests:  UDS 1/4 >negative  ECHO 1/4 >LVEF 20-25%, akinesis of the anteroseptal, anterolateral & apical myocardium, LVEF 62-83%, grade 1 diastolic dysfunction, small pericardial effusion without evidence of hemodynamic compromise. CT Chest / ABD / Pelvis 1/3 > airway thickening, motion artifact, CAD Echo 1/29> LVEF 60-65%, left ventricular hypertrophy, trivial pericardial effusion  Micro Data:  RVP 1/3 > positive for influenza A BCx2 1/3 >No growth UC 1/4 >No Growth Sputum Cx 1/7 > Normal respiratory flora BAL >corynebacterium  Blood Cx 1/10 > neg Sputum 1/21 > abundant corynebacterium striatum  Antimicrobials:  Vanco 1/3 x1;    Zosyn 1/30 x1  Tamiflu 1/3 >1/8 1/10 vancomycin>1/12 1/10 ceftaz >1/12;   1/21 >1/22 1/12 emycin > 1/22 1/23 cefepime > 1/24 1/21 Vanc> 1/28  Interim history/subjective:   01/09/2019 - After being on ATC for 72h -> Yesterday went down  4 shiley and then agitated, mucus plugging and secretions.  [place] to ICU last night after change for 4 cuffless trach to 6 cuffed. Currently with trach 6. Dr Nelda Marseille wants change to 5 XLT . Rec from last night is not to go down to 4 shiley. Just prior   Came off vent. Now on ATC. Per Dr Nelda Marseille - no need for vent night - and see how he does on ATC.   Per RN - currently on precedex   Objective   Blood pressure (!) 89/57, pulse 86, temperature 98.5 F (36.9 C), temperature source Oral, resp. rate (!) 21, height 6' (1.829 m), weight 91 kg, SpO2 100 %.    Vent Mode: CPAP;PSV FiO2 (%):  [40 %] 40 % Set Rate:  [20 bmp] 20 bmp Vt Set:  [151 mL] 620 mL PEEP:  [  5 cmH20] 5 cmH20 Pressure Support:  [5 cmH20] 5 cmH20 Plateau Pressure:  [15 cmH20-18 cmH20] 17 cmH20   Intake/Output Summary (Last 24 hours) at 01/09/2019 1625 Last data filed at 01/09/2019 1600 Gross per 24 hour  Intake 828.87 ml  Output 675 ml  Net 153.87 ml   Filed Weights   01/06/19 0500 01/07/19 0600 01/09/19 0500  Weight: 86.8 kg 89.1 kg  91 kg    General Appearance:  Looks deconditioned and chronic critially ill Head:  Normocephalic, without obvious abnormality, atraumatic Eyes:  PERRL - yes, conjunctiva/corneas - clear     Ears:  Normal external ear canals, both ears Nose:  G tube - no Throat:  ETT TUBE - no , OG tube - no. TRACH + . Just changed to size 5 Neck:  Supple,  No enlargement/tenderness/nodules Lungs: Clear to auscultation bilaterally, Barrell chest Heart:  S1 and S2 normal, no murmur, CVP - no.  Pressors - no Abdomen:  Soft, no masses, no organomegaly Genitalia / Rectal:  Not done Extremities:  Extremities- left BKA Skin:  ntact in exposed areas . Sacral area - intact Neurologic:  Sedation - preceex -> RASS - +1 . Moves all 4s - yes. CAM-ICU - not tested . Orientation - follows some commands . Can easily get agitated       LABS    PULMONARY Recent Labs  Lab 01/08/19 2318 01/09/19 0218  PHART 7.327* 7.494*  PCO2ART 74.0* 47.4  PO2ART 44.3* 52.0*  HCO3 37.4* 36.1*  O2SAT 71.2 88.0    CBC Recent Labs  Lab 01/04/19 0314 01/06/19 0521 01/09/19 0300  HGB 8.8* 9.4* 10.0*  HCT 28.9* 29.7* 33.0*  WBC 6.7 7.3 8.9  PLT 287 323 342    COAGULATION No results for input(s): INR in the last 168 hours.  CARDIAC   Recent Labs  Lab 01/09/19 0300 01/09/19 0919 01/09/19 1450  TROPONINI 0.04* <0.03 0.03*   No results for input(s): PROBNP in the last 168 hours.   CHEMISTRY Recent Labs  Lab 01/03/19 0550 01/04/19 0314 01/06/19 0521 01/09/19 0300  NA 138 139 144 150*  K 4.2 4.6 4.1 4.6  CL 101 100 101 108  CO2 28 29 34* 34*  GLUCOSE 141* 252* 182* 128*  BUN 41* 34* 45* 60*  CREATININE 0.69 0.63 0.71 1.16  CALCIUM 8.7* 8.9 9.1 9.4  MG 2.2 2.1  --  2.4  PHOS 3.9 5.4*  --   --    Estimated Creatinine Clearance: 74.3 mL/min (by C-G formula based on SCr of 1.16 mg/dL).   LIVER Recent Labs  Lab 01/06/19 0521 01/09/19 0300  AST 21 17  ALT 32 30  ALKPHOS 88 75  BILITOT 0.3  0.2*  PROT 7.6 7.6  ALBUMIN 1.9* 2.1*     INFECTIOUS Recent Labs  Lab 01/09/19 0300 01/09/19 0620  LATICACIDVEN 1.2 1.3     ENDOCRINE CBG (last 3)  Recent Labs    01/09/19 0827 01/09/19 1211 01/09/19 1522  GLUCAP 135* 103* 80         IMAGING x48h  - image(s) personally visualized  -   highlighted in bold Dg Chest Port 1 View  Result Date: 01/09/2019 CLINICAL DATA:  Respiratory distress.  Status post bronchoscopy EXAM: PORTABLE CHEST 1 VIEW COMPARISON:  01/03/2019 FINDINGS: Increasing consolidation in the right lower lung and left infrahilar region. Heart is normal size. No visible effusions or pneumothorax. Tracheostomy is unchanged. IMPRESSION: Increasing right lower lobe and left infrahilar consolidation. Electronically Signed  By: Rolm Baptise M.D.   On: 01/09/2019 01:17      Resolved Hospital Problem list   AKI Pneumonia Hypokalemia Hypernatremia  Assessment & Plan:   63 year old male with PMH of DM with multiple complications who presents with flu A infection and associated respiratory failure.  Patient was intubated and subsequently trached.    ASSESSMENT / PLAN:  PULMONARY A:  Acute on chronic respiratory failure -  flu a infection, Corynebacterium pneumonia. S/p tracheostomy  01/09/2019 - did not tolerate going down to size 4 shiley yesterday. Upsized to size 6 yesterday and now down sized to size 5   Plan Keep at size 5 . No capping.  Chest PT 4 times daily  Routine trach care Titrate O2 for sat of 88-92% SLP PT/OT/Speech    CARDIOVASCULAR A:   #current nil acute    P:  monitor  RENAL  Intake/Output Summary (Last 24 hours) at 01/09/2019 1626 Last data filed at 01/09/2019 1600 Gross per 24 hour  Intake 828.87 ml  Output 675 ml  Net 153.87 ml   Recent Labs  Lab 01/03/19 0550 01/04/19 0314 01/06/19 0521 01/09/19 0300  CREATININE 0.69 0.63 0.71 1.16     A:   #current nil acute  01/09/2019  P:    monitor  GASTROINTESTINAL A:   On TF  P:   Continue TF  HEMATOLOGIC Recent Labs  Lab 01/04/19 0314 01/06/19 0521 01/09/19 0300  HGB 8.8* 9.4* 10.0*  HCT 28.9* 29.7* 33.0*  WBC 6.7 7.3 8.9  PLT 287 323 342    A:   #RBC: anemia critical illness #Platelet normal #WBC norml  P:  - PRBC for hgb </= 6.9gm%    - exceptions are   -  if ACS susepcted/confirmed then transfuse for hgb </= 8.0gm%,  or    -  active bleeding with hemodynamic instability, then transfuse regardless of hemoglobin value   At at all times try to transfuse 1 unit prbc as possible with exception of active hemorrhage    INFECTIOUS No results for input(s): PROCALCITON in the last 168 hours.  Results for orders placed or performed during the hospital encounter of 11/28/18  Respiratory Panel by PCR     Status: Abnormal   Collection Time: 11/28/18  5:21 PM  Result Value Ref Range Status   Adenovirus NOT DETECTED NOT DETECTED Final   Coronavirus 229E NOT DETECTED NOT DETECTED Final   Coronavirus HKU1 NOT DETECTED NOT DETECTED Final   Coronavirus NL63 NOT DETECTED NOT DETECTED Final   Coronavirus OC43 NOT DETECTED NOT DETECTED Final   Metapneumovirus NOT DETECTED NOT DETECTED Final   Rhinovirus / Enterovirus NOT DETECTED NOT DETECTED Final   Influenza A H1 2009 DETECTED (A) NOT DETECTED Final   Influenza B NOT DETECTED NOT DETECTED Final   Parainfluenza Virus 1 NOT DETECTED NOT DETECTED Final   Parainfluenza Virus 2 NOT DETECTED NOT DETECTED Final   Parainfluenza Virus 3 NOT DETECTED NOT DETECTED Final   Parainfluenza Virus 4 NOT DETECTED NOT DETECTED Final   Respiratory Syncytial Virus NOT DETECTED NOT DETECTED Final   Bordetella pertussis NOT DETECTED NOT DETECTED Final   Chlamydophila pneumoniae NOT DETECTED NOT DETECTED Final   Mycoplasma pneumoniae NOT DETECTED NOT DETECTED Final  Blood Culture (routine x 2)     Status: None   Collection Time: 11/28/18  5:24 PM  Result Value Ref Range Status    Specimen Description BLOOD RIGHT ANTECUBITAL  Final   Special Requests  Final    BOTTLES DRAWN AEROBIC AND ANAEROBIC Blood Culture adequate volume   Culture   Final    NO GROWTH 5 DAYS Performed at [facility], [place] [address], [place], [place] [address]    Report Status 12/03/2018 FINAL  Final  Blood Culture (routine x 2)     Status: None   Collection Time: 11/28/18  5:25 PM  Result Value Ref Range Status   Specimen Description BLOOD LEFT ANTECUBITAL  Final   Special Requests   Final    BOTTLES DRAWN AEROBIC AND ANAEROBIC Blood Culture adequate volume   Culture   Final    NO GROWTH 5 DAYS Performed at [facility], [place] [address], [place], [place] [address]    Report Status 12/03/2018 FINAL  Final  MRSA PCR Screening     Status: None   Collection Time: 11/29/18  4:25 AM  Result Value Ref Range Status   MRSA by PCR NEGATIVE NEGATIVE Final    Comment:        The GeneXpert MRSA Assay (FDA approved for NASAL specimens only), is one component of a comprehensive MRSA colonization surveillance program. It is not intended to diagnose MRSA infection nor to guide or monitor treatment for MRSA infections. Performed at [facility], [place] [address], [place], [place] [address]   Urine culture     Status: None   Collection Time: 11/29/18  9:49 AM  Result Value Ref Range Status   Specimen Description URINE, CLEAN CATCH  Final   Special Requests NONE  Final   Culture   Final    NO GROWTH Performed at [facility], [place] [address] [place], [place] [address]    Report Status 11/30/2018 FINAL  Final  Culture, respiratory (non-expectorated)     Status: None   Collection Time: 12/02/18  7:46 AM  Result Value Ref Range Status   Specimen Description TRACHEAL ASPIRATE  Final   Special Requests NONE  Final   Gram Stain   Final    RARE WBC PRESENT, PREDOMINANTLY PMN FEW GRAM POSITIVE COCCI IN PAIRS IN CLUSTERS RARE GRAM POSITIVE RODS RARE GRAM NEGATIVE RODS     Culture   Final    Consistent with normal respiratory flora. Performed at [facility], [place] [address], [place], [place] [address]    Report Status 12/04/2018 FINAL  Final  Culture, respiratory (non-expectorated)     Status: None   Collection Time: 12/04/18 10:22 AM  Result Value Ref Range Status   Specimen Description BRONCHIAL ALVEOLAR LAVAGE  Final   Special Requests NONE  Final   Gram Stain   Final    ABUNDANT WBC PRESENT, PREDOMINANTLY PMN MODERATE GRAM POSITIVE RODS FEW GRAM POSITIVE COCCI Performed at [facility], [place] [address] [place], [place] [address]    Culture   Final    MODERATE DIPHTHEROIDS(CORYNEBACTERIUM SPECIES) Standardized susceptibility testing for this organism is not available.    Report Status 12/06/2018 FINAL  Final  Culture, blood (Routine X 2) w Reflex to ID Panel     Status: None   Collection Time: 12/05/18  6:50 AM  Result Value Ref Range Status   Specimen Description BLOOD LEFT ANTECUBITAL  Final   Special Requests   Final    BOTTLES DRAWN AEROBIC AND ANAEROBIC Blood Culture adequate volume   Culture   Final    NO GROWTH 5 DAYS Performed at [facility], [place],  [place] [address]    Report Status 12/10/2018 FINAL  Final  Culture, blood (Routine X 2) w Reflex to ID Panel     Status: None   Collection Time: 12/05/18  6:58 AM  Result Value Ref Range Status   Specimen Description BLOOD LEFT HAND  Final   Special Requests AEROBIC BOTTLE ONLY Blood Culture adequate volume  Final   Culture   Final    NO GROWTH 5 DAYS Performed at [facility], [place] [address], [place], [place] [address]    Report Status 12/10/2018 FINAL  Final  Culture, respiratory (non-expectorated)     Status: None   Collection Time: 12/16/18 12:13 PM  Result Value Ref Range Status   Specimen Description TRACHEAL ASPIRATE  Final   Special Requests NONE  Final   Gram Stain   Final    FEW WBC PRESENT, PREDOMINANTLY PMN ABUNDANT GRAM  POSITIVE RODS MODERATE GRAM POSITIVE COCCI IN CLUSTERS IN PAIRS FEW GRAM NEGATIVE RODS    Culture   Final    ABUNDANT CORYNEBACTERIUM STRIATUM Standardized susceptibility testing for this organism is not available. Performed at [facility], [place] [address], [place], [place] [address]    Report Status 12/19/2018 FINAL  Final  MRSA PCR Screening     Status: None   Collection Time: 01/04/19  9:26 PM  Result Value Ref Range Status   MRSA by PCR NEGATIVE NEGATIVE Final    Comment:        The GeneXpert MRSA Assay (FDA approved for NASAL specimens only), is one component of a comprehensive MRSA colonization surveillance program. It is not intended to diagnose MRSA infection nor to guide or monitor treatment for MRSA infections. Performed at [facility], [place] [address], [place], [place] [address]     A:   S/p flu and corneybacterium pna P:   Anti-infectives (From admission, onward)   Start     Dose/Rate Route Frequency Ordered Stop   12/23/18 1506  vancomycin (VANCOCIN) 1-5 GM/200ML-% IVPB    Note to Pharmacy:  Arlean Hopping   : cabinet override      12/23/18 1506 12/23/18 1515   12/20/18 2200  vancomycin (VANCOCIN) IVPB 1000 mg/200 mL premix  Status:  Discontinued     1,000 mg 200 mL/hr over 60 Minutes Intravenous Every 12 hours 12/20/18 1804 12/22/18 1413   12/18/18 0800  ceFEPIme (MAXIPIME) 1 g in sodium chloride 0.9 % 100 mL IVPB  Status:  Discontinued     1 g 200 mL/hr over 30 Minutes Intravenous Every 8 hours 12/18/18 0713 12/19/18 1201   12/17/18 0530  vancomycin (VANCOCIN) 1,500 mg in sodium chloride 0.9 % 500 mL IVPB  Status:  Discontinued     1,500 mg 250 mL/hr over 120 Minutes Intravenous Every 12 hours 12/16/18 1704 12/20/18 1804   12/16/18 1730  cefTAZidime (FORTAZ) 2 g in sodium chloride 0.9 % 100 mL IVPB  Status:  Discontinued     2 g 200 mL/hr over 30 Minutes Intravenous Every 8 hours 12/16/18 1704 12/18/18 0702   12/16/18 1730  vancomycin  (VANCOCIN) 2,000 mg in sodium chloride 0.9 % 500 mL IVPB     2,000 mg 250 mL/hr over 120 Minutes Intravenous  Once 12/16/18 1704 12/16/18 2030   12/07/18 1300  erythromycin 500 mg in sodium chloride 0.9 % 100 mL IVPB  Status:  Discontinued     500 mg 100 mL/hr over 60 Minutes Intravenous Every 6 hours 12/07/18 1118 12/16/18 1627  12/06/18 1500  vancomycin (VANCOCIN) 1,250 mg in sodium chloride 0.9 % 250 mL IVPB  Status:  Discontinued     1,250 mg 166.7 mL/hr over 90 Minutes Intravenous Every 12 hours 12/06/18 1436 12/07/18 1118   12/06/18 0100  vancomycin (VANCOCIN) 1,250 mg in sodium chloride 0.9 % 250 mL IVPB     1,250 mg 166.7 mL/hr over 90 Minutes Intravenous  Once 12/05/18 1054 12/06/18 0146   12/05/18 1200  vancomycin (VANCOCIN) 2,000 mg in sodium chloride 0.9 % 500 mL IVPB     2,000 mg 250 mL/hr over 120 Minutes Intravenous  Once 12/05/18 1054 12/05/18 1350   12/05/18 1200  cefTAZidime (FORTAZ) 1 g in sodium chloride 0.9 % 100 mL IVPB  Status:  Discontinued     1 g 200 mL/hr over 30 Minutes Intravenous Every 8 hours 12/05/18 1106 12/07/18 1118   11/30/18 2200  oseltamivir (TAMIFLU) 6 MG/ML suspension 30 mg     30 mg Per Tube 2 times daily 11/30/18 1348 12/03/18 2159   11/30/18 2200  ceFEPIme (MAXIPIME) 1 g in sodium chloride 0.9 % 100 mL IVPB  Status:  Discontinued     1 g 200 mL/hr over 30 Minutes Intravenous Every 12 hours 11/30/18 1350 12/01/18 1039   11/29/18 2200  ceFEPIme (MAXIPIME) 2 g in sodium chloride 0.9 % 100 mL IVPB  Status:  Discontinued     2 g 200 mL/hr over 30 Minutes Intravenous Every 12 hours 11/29/18 1645 11/30/18 1350   11/29/18 1100  oseltamivir (TAMIFLU) 6 MG/ML suspension 75 mg  Status:  Discontinued     75 mg Per Tube 2 times daily 11/29/18 1000 11/30/18 1348   11/28/18 2300  oseltamivir (TAMIFLU) capsule 75 mg  Status:  Discontinued     75 mg Oral 2 times daily 11/28/18 2238 11/29/18 1000   11/28/18 2030  oseltamivir (TAMIFLU) capsule 75 mg     75 mg  Oral  Once 11/28/18 2029 11/28/18 2207   11/28/18 1745  vancomycin (VANCOCIN) 2,000 mg in sodium chloride 0.9 % 500 mL IVPB     2,000 mg 250 mL/hr over 120 Minutes Intravenous  Once 11/28/18 1741 11/28/18 2203   11/28/18 1745  piperacillin-tazobactam (ZOSYN) IVPB 3.375 g     3.375 g 100 mL/hr over 30 Minutes Intravenous  Once 11/28/18 1741 11/28/18 1834       ENDOCRINE A:   At risk hyperglycemia   P:   ICU hyperglycemia protocol  NEUROLOGIC A:    #Current: needing precedex gtt    P:   RASS goal: 0 precedex gtt   FAMILY  - Updates: 01/09/2019 --> no family at bedside  - Inter-disciplinary family meet or Palliative Care meeting due by:  DAy 7. Current LOS is LOS 42 days   DISPO Keep in ICU tonight off vent and if stable - trH primary and ccm trach care 2x/week. D/w Dr Nelda Marseille and Dr Thereasa Solo       ATTESTATION & SIGNATURE    Dr. Brand Males, M.D., [facility].C.P Pulmonary and Critical Care Medicine Staff Physician [place] [facility] Pager: [phone], If no answer or between  15:00h - 7:00h: call [phone]  01/09/2019 4:29 PM

## 2019-01-09 NOTE — Progress Notes (Signed)
Physical Therapy Treatment Patient Details Name: Antonio [name]. MRN: [id number] DOB: 09-10-1956 Today's Date: 01/09/2019    History of Present Illness 63 yo admitted with flu A and PNA with acute respiratory failure intubated 1/4, trach 1/16 with course complicated by acute MI, CHF and cardiogenic shock and mucous plugging. Transferred to ICU late 2/13/early 2/14 due to dyspnea and drop in sats with thick mucous.PMhx: DM, HTN, HLD, CAD, Lt BKA, rt toe amputation    PT Comments    Patient tolerating sitting EOB and transfers to chair while on vent. initially reluctant to put on prosthetic but eventually agreeable and comes to standing with mod A. Will continue to progress activity as tolerated.      Follow Up Recommendations  Supervision/Assistance - 24 hour;SNF     Equipment Recommendations  Rolling walker with 5" wheels    Recommendations for Other Services OT consult     Precautions / Restrictions Precautions Precautions: Fall Precaution Comments: trach, L BKA, PEG Required Braces or Orthoses: Other Brace Other Brace: LLE prothesis Restrictions Weight Bearing Restrictions: No    Mobility  Bed Mobility Overal bed mobility: Needs Assistance Bed Mobility: Supine to Sit;Sit to Supine;Rolling Rolling: Mod assist Sidelying to sit: Mod assist;Max assist;+2 for physical assistance Supine to sit: Mod assist Sit to supine: Mod assist Sit to sidelying: Min assist;+2 for physical assistance;+2 for safety/equipment General bed mobility comments: max encouragement, able to bring LEs off EOB but mod assist to ascend trunk   Transfers Overall transfer level: Needs assistance Equipment used: 2 person hand held assist;1 person hand held assist Transfers: Sit to/from [employer] Sit to Stand: Mod assist;+2 safety/equipment Stand pivot transfers: Mod assist;+2 physical assistance      Lateral/Scoot Transfers: Min assist;+2 physical assistance General  transfer comment: Therapist standing in front of him for transfers  Ambulation/Gait                 Stairs             Wheelchair Mobility    Modified Rankin (Stroke Patients Only)       Balance Overall balance assessment: Needs assistance Sitting-balance support: No upper extremity supported;Feet supported Sitting balance-Leahy Scale: Fair Sitting balance - Comments: min guard for safety at EOB Postural control: Right lateral lean Standing balance support: Bilateral upper extremity supported;During functional activity Standing balance-Leahy Scale: Poor                              Cognition Arousal/Alertness: Awake/alert Behavior During Therapy: Flat affect Overall Cognitive Status: Impaired/Different from baseline Area of Impairment: Following commands;Safety/judgement;Problem solving;Attention                 Orientation Level: Disoriented to;Time Current Attention Level: Sustained   Following Commands: Follows one step commands with increased time Safety/Judgement: Decreased awareness of safety;Decreased awareness of deficits   Problem Solving: Slow processing;Decreased initiation;Difficulty sequencing;Requires verbal cues;Requires tactile cues General Comments: Pt stating he needed "to shit", brought out [facility] and started helping don LLE prothesis (he let me put neoprene sleeve on put then wouldn't let me put on his prothesis). We tried to transfer without prothesis-but did not work. He then took off LLE sleeve and pointed to his prothesis wanting to put it on, but without his sleeve. We explained to him that he had to put on sleeve first and he eventually let us do this for him. Perservates on wanting water.  Exercises      General Comments        Pertinent Vitals/Pain Pain Assessment: Faces Faces Pain Scale: Hurts a little bit Pain Location: bottom when hygiene performed Pain Descriptors / Indicators: Discomfort;Sore Pain  Intervention(s): Limited activity within patient's tolerance;Monitored during session    Home Living                      Prior Function            PT Goals (current goals can now be found in the care plan section) Acute Rehab PT Goals Patient Stated Goal: none stated PT Goal Formulation: With patient Time For Goal Achievement: 01/19/19 Potential to Achieve Goals: Good Progress towards PT goals: Progressing toward goals    Frequency    Min 3X/week      PT Plan Current plan remains appropriate    Co-evaluation PT/OT/SLP Co-Evaluation/Treatment: Yes Reason for Co-Treatment: Complexity of the patient's impairments (multi-system involvement) PT goals addressed during session: Mobility/safety with mobility OT goals addressed during session: ADL's and self-care;Strengthening/ROM      AM-PAC PT "6 Clicks" Mobility   Outcome Measure  Help needed turning from your back to your side while in a flat bed without using bedrails?: A Little Help needed moving from lying on your back to sitting on the side of a flat bed without using bedrails?: A Little Help needed moving to and from a bed to a chair (including a wheelchair)?: A Lot Help needed standing up from a chair using your arms (e.g., wheelchair or bedside chair)?: A Lot Help needed to walk in hospital room?: A Lot Help needed climbing 3-5 steps with a railing? : Total 6 Click Score: 13    End of Session Equipment Utilized During Treatment: Gait belt Activity Tolerance: Patient tolerated treatment well Patient left: with call bell/phone within reach;in bed;with chair alarm set Nurse Communication: Mobility status PT Visit Diagnosis: Other abnormalities of gait and mobility (R26.89);Muscle weakness (generalized) (M62.81);Unsteadiness on feet (R26.81)     Time: 1345-1410 PT Time Calculation (min) (ACUTE ONLY): 25 min  Charges:  $Therapeutic Activity: 8-22 mins                     Etta Grandchild, PT, DPT Acute  Rehabilitation Services Pager: [phone] Office: [phone]     Etta Grandchild 01/09/2019, 3:59 PM

## 2019-01-09 NOTE — Progress Notes (Signed)
eLink Physician-Brief Progress Note Patient Name: Antonio [name]. DOB: March 07, 1956 MRN: [id number]   Date of Service  01/09/2019  HPI/Events of Note  Troponin ok Sodium and BUN up. Dehydrated?. BNP pnd.  eICU Interventions  LR 500 ml bolus for sbp 95.      Intervention Category Intermediate Interventions: Diagnostic test evaluation  Ranee Gosselin 01/09/2019, 4:32 AM

## 2019-01-09 NOTE — Procedures (Signed)
Tracheostomy Change Note  Patient Details:   Name: Antonio [name]. DOB: [birth year] MRN: [id number]    Airway Documentation:     Evaluation  O2 sats: stable throughout Complications: No apparent complications Patient did tolerate procedure well. Bilateral Breath Sounds: Rhonchi   #4 cuffless shiley changed to #6 cuffed shiley for ventilator support. Positive breath sounds and color change with EZ-Cap. 14 french suction catheter passed with no obstructions. Suctioned out copious thick yellow secretions. X-ray pending.  Leafy Half 01/09/2019, 1:05 AM

## 2019-01-09 NOTE — Procedures (Signed)
Tracheostomy Change Note  Patient Details:   Name: Antonio [name]. DOB: 01-21-1956 MRN: [id number]    Airway Documentation:     Evaluation  O2 sats: stable throughout Complications: No apparent complications Patient did tolerate procedure well. Bilateral Breath Sounds: Rhonchi, Diminished    Toula Moos 01/09/2019, 4:55 PM

## 2019-01-10 ENCOUNTER — Inpatient Hospital Stay (HOSPITAL_COMMUNITY): Payer: Medicaid Other

## 2019-01-10 DIAGNOSIS — J9620 Acute and chronic respiratory failure, unspecified whether with hypoxia or hypercapnia: Secondary | ICD-10-CM

## 2019-01-10 LAB — GLUCOSE, CAPILLARY
Glucose-Capillary: 197 mg/dL — ABNORMAL HIGH (ref 70–99)
Glucose-Capillary: 229 mg/dL — ABNORMAL HIGH (ref 70–99)
Glucose-Capillary: 191 mg/dL — ABNORMAL HIGH (ref 70–99)
Glucose-Capillary: 108 mg/dL — ABNORMAL HIGH (ref 70–99)
Glucose-Capillary: 210 mg/dL — ABNORMAL HIGH (ref 70–99)

## 2019-01-10 MED ORDER — FREE WATER
200.0000 mL | Status: DC
  Administered 2019-01-10 – 2019-01-16 (×36): 200 mL

## 2019-01-10 MED ORDER — POTASSIUM CHLORIDE 20 MEQ/15ML (10%) PO SOLN
20.0000 meq | Freq: Once | ORAL | Status: AC
  Administered 2019-01-10: 20 meq
  Filled 2019-01-10: qty 15

## 2019-01-10 MED ORDER — FUROSEMIDE 10 MG/ML IJ SOLN
20.0000 mg | Freq: Once | INTRAMUSCULAR | Status: AC
  Administered 2019-01-10: 20 mg via INTRAVENOUS
  Filled 2019-01-10: qty 2

## 2019-01-10 NOTE — Progress Notes (Signed)
Pt placed back on full vent support at this time due to increased WOB.  Pt tolerating well. RT will monitor. Goal is to transition back to ATC later today if able.

## 2019-01-10 NOTE — Progress Notes (Signed)
NAME:  Antonio [name]., MRN:  [id number], DOB:  Feb 07, 1956, LOS: 25 ADMISSION DATE:  11/28/2018, CONSULTATION DATE:  1/3 REFERRING MD:  EDP, CHIEF COMPLAINT:  Dyspnea   Brief History   63 year old male with influenza A causing respiratory failure and cardiogenic shock. Prolonged mechanical ventilation, tracheostomy placed on January 16.  Past Medical History  DM2, CAD, HTN  Significant Hospital Events   1/6 self extubated 1/7 re-intubated 1/8 self extubated 1/9 acute respiratory failure, mucus plugging right lower lobe, re-intubated, bronchoscopy 1/10 New fever T Max 103.1, antibiotics resumed with ceftaz and vancomycin 1/11: Hemodynamically seems to be improving. Very hypernatremic. X-ray improving. Adding free water, one-time Lasix, follow-up chemistry a.m. Hoping to initiate weaning efforts on 1/12 1/16. Continuing diuresis and ongoing ventilatory support. Working on weaning but still has significant work of breathing. Plan has been to proceed with tracheostomy. Hopefully we can continue ongoing diuresis and continue to decrease sedation 1/20 still struggling to wean. Secretions and anxiety are barriers.  1/21 trach revision to 6 distal XLT, fever, increased secretions, abx broadened / bronch 1/22 ongoing thick secretions 1/24 Failed SBT, low grade fever, Per CXR persistent pleural effusions / basilar consolidation atelectasis vs pneumonia per bases 1/25>> Mucus plugged with slow recovery after chest PT and Wound Care 2/3 > first time weaned for greater than 15 minutes (previously failed due to agitation, secretions, tachypnea) 2/14 -  After being on ATC for 72h -> Yesterday went down  4 shiley and then agitated, mucus plugging and secretions.  [place] to ICU last night after change for 4 cuffless trach to 6 cuffed. Currently with trach 6. Dr Nelda Marseille wants change to 5 XLT . Rec from last night is not to go down to 4 shiley. Came off vent. Now on ATC. Per Dr Nelda Marseille - no need  for vent night - and see how he does on ATC. Per RN - currently on precedex   Consults:  [facility] Cardiology   Procedures:  ETT 1/3 >>1/6, 1/7 > 1/8> 1/9> 1/16 1/5 Right TL PICC >> 2/4 1/9 Bronch> Mucus plugging RLL Trach 1/16 (JY) >1/20 trach change failed and #4 cuffed 1/21 trach upsized to 6 cuffed XLT distal > Down size to 4 cuffless 2/13 1/17 right nare cortrak > 1/29 1/28 gastrostomy tube >    Significant Diagnostic Tests:  UDS 1/4 >negative  ECHO 1/4 >LVEF 20-25%, akinesis of the anteroseptal, anterolateral & apical myocardium, LVEF 82-95%, grade 1 diastolic dysfunction, small pericardial effusion without evidence of hemodynamic compromise. CT Chest / ABD / Pelvis 1/3 > airway thickening, motion artifact, CAD Echo 1/29> LVEF 60-65%, left ventricular hypertrophy, trivial pericardial effusion  Micro Data:  RVP 1/3 > positive for influenza A BCx2 1/3 >No growth UC 1/4 >No Growth Sputum Cx 1/7 > Normal respiratory flora BAL >corynebacterium  Blood Cx 1/10 > neg Sputum 1/21 > abundant corynebacterium striatum  Antimicrobials:  Vanco 1/3 x1;    Zosyn 1/30 x1  Tamiflu 1/3 >1/8 1/10 vancomycin>1/12 1/10 ceftaz >1/12;   1/21 >1/22 1/12 emycin > 1/22 1/23 cefepime > 1/24 1/21 Vanc> 1/28  Interim history/subjective:   01/10/2019 - back on vent full support due to increased work of breathing. Per RN -> could be from smaller trach + lot of copious secretions. Doing well on vent all morning. Off precedex. Got fent for pain once  Objective   Blood pressure (!) 115/59, pulse 97, temperature 98.6 F (37 C), temperature source Oral, resp. rate 20, height 6' (1.829 m), weight  91.2 kg, SpO2 96 %.    Vent Mode: PRVC FiO2 (%):  [28 %-40 %] 30 % Set Rate:  [20 bmp] 20 bmp Vt Set:  [620 mL] 620 mL PEEP:  [5 cmH20] 5 cmH20 Plateau Pressure:  [18 cmH20-20 cmH20] 18 cmH20   Intake/Output Summary (Last 24 hours) at 01/10/2019 1811 Last data filed at 01/10/2019 1800 Gross per  24 hour  Intake 1315 ml  Output 2295 ml  Net -980 ml   Filed Weights   01/07/19 0600 01/09/19 0500 01/10/19 0426  Weight: 89.1 kg 91 kg 91.2 kg   General Appearance:  Looks criticall ill Head:  Normocephalic, without obvious abnormality, atraumatic Eyes:  PERRL - yes, conjunctiva/corneas - yes     Ears:  Normal external ear canals, both ears Nose:  G tube - yes Throat:  ETT TUBE - no , OG tube - no. TRACH + Neck:  Supple,  No enlargement/tenderness/nodules Lungs: Clear to auscultation bilaterally, Ventilator   Synchrony - .  Heart:  S1 and S2 normal, no murmur, CVP - no.  Pressors - no Abdomen:  Soft, no masses, no organomegaly Genitalia / Rectal:  Not done Extremities:  Extremities- left bka Skin:  ntact in exposed areas . Sacral area - not examined Neurologic:  Sedation - fent prn -> RASS - -2         LABS    PULMONARY Recent Labs  Lab 01/08/19 2318 01/09/19 0218  PHART 7.327* 7.494*  PCO2ART 74.0* 47.4  PO2ART 44.3* 52.0*  HCO3 37.4* 36.1*  O2SAT 71.2 88.0    CBC Recent Labs  Lab 01/04/19 0314 01/06/19 0521 01/09/19 0300  HGB 8.8* 9.4* 10.0*  HCT 28.9* 29.7* 33.0*  WBC 6.7 7.3 8.9  PLT 287 323 342    COAGULATION No results for input(s): INR in the last 168 hours.  CARDIAC   Recent Labs  Lab 01/09/19 0300 01/09/19 0919 01/09/19 1450  TROPONINI 0.04* <0.03 0.03*   No results for input(s): PROBNP in the last 168 hours.   CHEMISTRY Recent Labs  Lab 01/04/19 0314 01/06/19 0521 01/09/19 0300  NA 139 144 150*  K 4.6 4.1 4.6  CL 100 101 108  CO2 29 34* 34*  GLUCOSE 252* 182* 128*  BUN 34* 45* 60*  CREATININE 0.63 0.71 1.16  CALCIUM 8.9 9.1 9.4  MG 2.1  --  2.4  PHOS 5.4*  --   --    Estimated Creatinine Clearance: 74.3 mL/min (by C-G formula based on SCr of 1.16 mg/dL).   LIVER Recent Labs  Lab 01/06/19 0521 01/09/19 0300  AST 21 17  ALT 32 30  ALKPHOS 88 75  BILITOT 0.3 0.2*  PROT 7.6 7.6  ALBUMIN 1.9* 2.1*      INFECTIOUS Recent Labs  Lab 01/09/19 0300 01/09/19 0620  LATICACIDVEN 1.2 1.3     ENDOCRINE CBG (last 3)  Recent Labs    01/10/19 0741 01/10/19 1150 01/10/19 1622  GLUCAP 229* 191* 108*         IMAGING x48h  - image(s) personally visualized  -   highlighted in bold Dg Chest Port 1 View  Result Date: 01/10/2019 CLINICAL DATA:  Shortness of breath EXAM: PORTABLE CHEST 1 VIEW COMPARISON:  January 09, 2019 FINDINGS: The tracheostomy tube is stable. No pneumothorax. The cardiomediastinal silhouette is unchanged. Opacity in the right base has improved. No acute interval changes. Stable cardiomediastinal silhouette. IMPRESSION: Improving opacity in the right base. Stable tracheostomy tube. No other acute abnormalities. Electronically Signed  By: Dorise Bullion III M.D   On: 01/10/2019 14:03   Dg Chest Port 1 View  Result Date: 01/09/2019 CLINICAL DATA:  Respiratory distress.  Status post bronchoscopy EXAM: PORTABLE CHEST 1 VIEW COMPARISON:  01/03/2019 FINDINGS: Increasing consolidation in the right lower lung and left infrahilar region. Heart is normal size. No visible effusions or pneumothorax. Tracheostomy is unchanged. IMPRESSION: Increasing right lower lobe and left infrahilar consolidation. Electronically Signed   By: Rolm Baptise M.D.   On: 01/09/2019 01:17      Resolved Hospital Problem list   AKI Pneumonia Hypokalemia Hypernatremia  Assessment & Plan:   63 year old male with PMH of DM with multiple complications who presents with flu A infection and associated respiratory failure.  Patient was intubated and subsequently trached.    ASSESSMENT / PLAN:  PULMONARY A:  Acute on chronic respiratory failure -  flu a infection, Corynebacterium pneumonia. S/p tracheostomy - downsized to size 4 on 2/12. Upsized to size 6 on 2/13. Downsized to size 5 - 2/14  01/10/2019 - Not tolerating ATC on size 5 and secretions +. NEeding vent support   Plan Routine  trach care Size 5 trach - might need to upsize Continue full vent support Try 1 dose lasix Titrate O2 for sat of 88-92% SLP PT/OT/Speech    CARDIOVASCULAR A:   #current nil acute    P:  monitor  RENAL  Intake/Output Summary (Last 24 hours) at 01/10/2019 1811 Last data filed at 01/10/2019 1800 Gross per 24 hour  Intake 1315 ml  Output 2295 ml  Net -980 ml   Recent Labs  Lab 01/04/19 0314 01/06/19 0521 01/09/19 0300  CREATININE 0.63 0.71 1.16     A:   #current nil acute  01/10/2019  P:   monitor  GASTROINTESTINAL A:   On TF  P:   Continue TF  HEMATOLOGIC Recent Labs  Lab 01/04/19 0314 01/06/19 0521 01/09/19 0300  HGB 8.8* 9.4* 10.0*  HCT 28.9* 29.7* 33.0*  WBC 6.7 7.3 8.9  PLT 287 323 342    A:   #RBC: anemia critical illness #Platelet normal #WBC norml  P:  - PRBC for hgb </= 6.9gm%    - exceptions are   -  if ACS susepcted/confirmed then transfuse for hgb </= 8.0gm%,  or    -  active bleeding with hemodynamic instability, then transfuse regardless of hemoglobin value   At at all times try to transfuse 1 unit prbc as possible with exception of active hemorrhage    INFECTIOUS No results for input(s): PROCALCITON in the last 168 hours.  Results for orders placed or performed during the hospital encounter of 11/28/18  Respiratory Panel by PCR     Status: Abnormal   Collection Time: 11/28/18  5:21 PM  Result Value Ref Range Status   Adenovirus NOT DETECTED NOT DETECTED Final   Coronavirus 229E NOT DETECTED NOT DETECTED Final   Coronavirus HKU1 NOT DETECTED NOT DETECTED Final   Coronavirus NL63 NOT DETECTED NOT DETECTED Final   Coronavirus OC43 NOT DETECTED NOT DETECTED Final   Metapneumovirus NOT DETECTED NOT DETECTED Final   Rhinovirus / Enterovirus NOT DETECTED NOT DETECTED Final   Influenza A H1 2009 DETECTED (A) NOT DETECTED Final   Influenza B NOT DETECTED NOT DETECTED Final   Parainfluenza Virus 1 NOT DETECTED NOT DETECTED  Final   Parainfluenza Virus 2 NOT DETECTED NOT DETECTED Final   Parainfluenza Virus 3 NOT DETECTED NOT DETECTED Final   Parainfluenza Virus  4 NOT DETECTED NOT DETECTED Final   Respiratory Syncytial Virus NOT DETECTED NOT DETECTED Final   Bordetella pertussis NOT DETECTED NOT DETECTED Final   Chlamydophila pneumoniae NOT DETECTED NOT DETECTED Final   Mycoplasma pneumoniae NOT DETECTED NOT DETECTED Final  Blood Culture (routine x 2)     Status: None   Collection Time: 11/28/18  5:24 PM  Result Value Ref Range Status   Specimen Description BLOOD RIGHT ANTECUBITAL  Final   Special Requests   Final    BOTTLES DRAWN AEROBIC AND ANAEROBIC Blood Culture adequate volume   Culture   Final    NO GROWTH 5 DAYS Performed at [facility], [place] [address], [place], [place] [address]    Report Status 12/03/2018 FINAL  Final  Blood Culture (routine x 2)     Status: None   Collection Time: 11/28/18  5:25 PM  Result Value Ref Range Status   Specimen Description BLOOD LEFT ANTECUBITAL  Final   Special Requests   Final    BOTTLES DRAWN AEROBIC AND ANAEROBIC Blood Culture adequate volume   Culture   Final    NO GROWTH 5 DAYS Performed at [facility], [place] [address], [place], [place] [address]    Report Status 12/03/2018 FINAL  Final  MRSA PCR Screening     Status: None   Collection Time: 11/29/18  4:25 AM  Result Value Ref Range Status   MRSA by PCR NEGATIVE NEGATIVE Final    Comment:        The GeneXpert MRSA Assay (FDA approved for NASAL specimens only), is one component of a comprehensive MRSA colonization surveillance program. It is not intended to diagnose MRSA infection nor to guide or monitor treatment for MRSA infections. Performed at [facility], [place] [address] [place], [place] [address]   Urine culture     Status: None   Collection Time: 11/29/18  9:49 AM  Result Value Ref Range Status   Specimen Description URINE, CLEAN CATCH  Final   Special Requests NONE   Final   Culture   Final    NO GROWTH Performed at [facility], [place] [address], [place], [place] [address]    Report Status 11/30/2018 FINAL  Final  Culture, respiratory (non-expectorated)     Status: None   Collection Time: 12/02/18  7:46 AM  Result Value Ref Range Status   Specimen Description TRACHEAL ASPIRATE  Final   Special Requests NONE  Final   Gram Stain   Final    RARE WBC PRESENT, PREDOMINANTLY PMN FEW GRAM POSITIVE COCCI IN PAIRS IN CLUSTERS RARE GRAM POSITIVE RODS RARE GRAM NEGATIVE RODS    Culture   Final    Consistent with normal respiratory flora. Performed at [facility], [place] [address] [place], [place] [address]    Report Status 12/04/2018 FINAL  Final  Culture, respiratory (non-expectorated)     Status: None   Collection Time: 12/04/18 10:22 AM  Result Value Ref Range Status   Specimen Description BRONCHIAL ALVEOLAR LAVAGE  Final   Special Requests NONE  Final   Gram Stain   Final    ABUNDANT WBC PRESENT, PREDOMINANTLY PMN MODERATE GRAM POSITIVE RODS FEW GRAM POSITIVE COCCI Performed at [facility], [place] [address] [place], [place] [address]    Culture   Final    MODERATE DIPHTHEROIDS(CORYNEBACTERIUM SPECIES) Standardized susceptibility testing for this organism is not available.    Report Status 12/06/2018 FINAL  Final  Culture, blood (Routine X 2) w Reflex to ID Panel     Status: None   Collection Time: 12/05/18  6:50 AM  Result Value Ref Range Status   Specimen Description BLOOD LEFT ANTECUBITAL  Final   Special Requests   Final    BOTTLES DRAWN AEROBIC AND ANAEROBIC Blood Culture adequate volume   Culture   Final    NO GROWTH 5 DAYS Performed at [facility], [address], [place], [place] [address]    Report Status 12/10/2018 FINAL  Final  Culture, blood (Routine X 2) w Reflex to ID Panel     Status: None   Collection Time: 12/05/18  6:58 AM  Result Value Ref Range Status   Specimen Description BLOOD LEFT HAND   Final   Special Requests AEROBIC BOTTLE ONLY Blood Culture adequate volume  Final   Culture   Final    NO GROWTH 5 DAYS Performed at [facility], [place] [address], [place], [place] [address]    Report Status 12/10/2018 FINAL  Final  Culture, respiratory (non-expectorated)     Status: None   Collection Time: 12/16/18 12:13 PM  Result Value Ref Range Status   Specimen Description TRACHEAL ASPIRATE  Final   Special Requests NONE  Final   Gram Stain   Final    FEW WBC PRESENT, PREDOMINANTLY PMN ABUNDANT GRAM POSITIVE RODS MODERATE GRAM POSITIVE COCCI IN CLUSTERS IN PAIRS FEW GRAM NEGATIVE RODS    Culture   Final    ABUNDANT CORYNEBACTERIUM STRIATUM Standardized susceptibility testing for this organism is not available. Performed at [facility], [place] [address] [place], [place] [address]    Report Status 12/19/2018 FINAL  Final  MRSA PCR Screening     Status: None   Collection Time: 01/04/19  9:26 PM  Result Value Ref Range Status   MRSA by PCR NEGATIVE NEGATIVE Final    Comment:        The GeneXpert MRSA Assay (FDA approved for NASAL specimens only), is one component of a comprehensive MRSA colonization surveillance program. It is not intended to diagnose MRSA infection nor to guide or monitor treatment for MRSA infections. Performed at [facility], [place] [address] [place],  [address]     A:   S/p flu and corneybacterium pna P:   Anti-infectives (From admission, onward)   Start     Dose/Rate Route Frequency Ordered Stop   12/23/18 1506  vancomycin (VANCOCIN) 1-5 GM/200ML-% IVPB    Note to Pharmacy:  Arlean Hopping   : cabinet override      12/23/18 1506 12/23/18 1515   12/20/18 2200  vancomycin (VANCOCIN) IVPB 1000 mg/200 mL premix  Status:  Discontinued     1,000 mg 200 mL/hr over 60 Minutes Intravenous Every 12 hours 12/20/18 1804 12/22/18 1413   12/18/18 0800  ceFEPIme (MAXIPIME) 1 g in sodium chloride 0.9 % 100 mL IVPB  Status:   Discontinued     1 g 200 mL/hr over 30 Minutes Intravenous Every 8 hours 12/18/18 0713 12/19/18 1201   12/17/18 0530  vancomycin (VANCOCIN) 1,500 mg in sodium chloride 0.9 % 500 mL IVPB  Status:  Discontinued     1,500 mg 250 mL/hr over 120 Minutes Intravenous Every 12 hours 12/16/18 1704 12/20/18 1804   12/16/18 1730  cefTAZidime (FORTAZ) 2 g in sodium chloride 0.9 % 100 mL IVPB  Status:  Discontinued     2 g 200 mL/hr over  30 Minutes Intravenous Every 8 hours 12/16/18 1704 12/18/18 0702   12/16/18 1730  vancomycin (VANCOCIN) 2,000 mg in sodium chloride 0.9 % 500 mL IVPB     2,000 mg 250 mL/hr over 120 Minutes Intravenous  Once 12/16/18 1704 12/16/18 2030   12/07/18 1300  erythromycin 500 mg in sodium chloride 0.9 % 100 mL IVPB  Status:  Discontinued     500 mg 100 mL/hr over 60 Minutes Intravenous Every 6 hours 12/07/18 1118 12/16/18 1627   12/06/18 1500  vancomycin (VANCOCIN) 1,250 mg in sodium chloride 0.9 % 250 mL IVPB  Status:  Discontinued     1,250 mg 166.7 mL/hr over 90 Minutes Intravenous Every 12 hours 12/06/18 1436 12/07/18 1118   12/06/18 0100  vancomycin (VANCOCIN) 1,250 mg in sodium chloride 0.9 % 250 mL IVPB     1,250 mg 166.7 mL/hr over 90 Minutes Intravenous  Once 12/05/18 1054 12/06/18 0146   12/05/18 1200  vancomycin (VANCOCIN) 2,000 mg in sodium chloride 0.9 % 500 mL IVPB     2,000 mg 250 mL/hr over 120 Minutes Intravenous  Once 12/05/18 1054 12/05/18 1350   12/05/18 1200  cefTAZidime (FORTAZ) 1 g in sodium chloride 0.9 % 100 mL IVPB  Status:  Discontinued     1 g 200 mL/hr over 30 Minutes Intravenous Every 8 hours 12/05/18 1106 12/07/18 1118   11/30/18 2200  oseltamivir (TAMIFLU) 6 MG/ML suspension 30 mg     30 mg Per Tube 2 times daily 11/30/18 1348 12/03/18 2159   11/30/18 2200  ceFEPIme (MAXIPIME) 1 g in sodium chloride 0.9 % 100 mL IVPB  Status:  Discontinued     1 g 200 mL/hr over 30 Minutes Intravenous Every 12 hours 11/30/18 1350 12/01/18 1039   11/29/18  2200  ceFEPIme (MAXIPIME) 2 g in sodium chloride 0.9 % 100 mL IVPB  Status:  Discontinued     2 g 200 mL/hr over 30 Minutes Intravenous Every 12 hours 11/29/18 1645 11/30/18 1350   11/29/18 1100  oseltamivir (TAMIFLU) 6 MG/ML suspension 75 mg  Status:  Discontinued     75 mg Per Tube 2 times daily 11/29/18 1000 11/30/18 1348   11/28/18 2300  oseltamivir (TAMIFLU) capsule 75 mg  Status:  Discontinued     75 mg Oral 2 times daily 11/28/18 2238 11/29/18 1000   11/28/18 2030  oseltamivir (TAMIFLU) capsule 75 mg     75 mg Oral  Once 11/28/18 2029 11/28/18 2207   11/28/18 1745  vancomycin (VANCOCIN) 2,000 mg in sodium chloride 0.9 % 500 mL IVPB     2,000 mg 250 mL/hr over 120 Minutes Intravenous  Once 11/28/18 1741 11/28/18 2203   11/28/18 1745  piperacillin-tazobactam (ZOSYN) IVPB 3.375 g     3.375 g 100 mL/hr over 30 Minutes Intravenous  Once 11/28/18 1741 11/28/18 1834       ENDOCRINE A:   At risk hyperglycemia   P:   ICU hyperglycemia protocol  NEUROLOGIC A:    #not needing precedex 01/10/2019    P:   RASS goal: 0 precedex gtt   FAMILY  - Updates: 01/10/2019 --> no family at bedside  - Inter-disciplinary family meet or Palliative Care meeting due by:  DAy 7. Current LOS is LOS 76 days   DISPO Needing vent     ATTESTATION & SIGNATURE   The patient Cylis Ayars. is critically ill with multiple organ systems failure and requires high complexity decision making for assessment and support, frequent  evaluation and titration of therapies, application of advanced monitoring technologies and extensive interpretation of multiple databases.   Critical Care Time devoted to patient care services described in this note is  30  Minutes. This time reflects time of care of this signee Dr Brand Males. This critical care time does not reflect procedure time, or teaching time or supervisory time of PA/NP/Med student/Med Resident etc but could involve care discussion time      Dr. Brand Males, M.D., [facility].C.P Pulmonary and Critical Care Medicine Staff Physician [place] [facility] Pager: [phone], If no answer or between  15:00h - 7:00h: call [phone]  01/10/2019 6:20 PM

## 2019-01-10 NOTE — Progress Notes (Addendum)
Antonio [name].  VQQ:[id number] DOB: October 08, 1956 DOA: 11/28/2018 PCP: Antonio Riches, NP    Brief Narrative:  63yo male with influenza A causing respiratory failure and cardiogenic shock. Prolonged mechanical ventilation, tracheostomy placed on January 16.  Significant Events:  1/6 self extubated  1/7 re-intubated  1/8 self extubated 1/9 acute respiratory failure, mucus plugging right lower lobe, re-intubated, bronchoscopy 1/10 New fever T Max 103.1, antibiotics resumed with ceftaz and vancomycin 1/11 Hemodynamically seems to be improving. Very hypernatremic. X-ray improving. Adding free water, one-time Lasix, follow-up chemistry a.m. Hoping to initiate weaning efforts on 1/12 1/16 Continuing diuresis and ongoing ventilatory support. Working on weaning but still has significant work of breathing. Plan has been to proceed with tracheostomy. Hopefully we can continue ongoing diuresis and continue to decrease sedation 1/20 still struggling to wean. Secretions and anxiety are barriers.  1/21 trach revision to 6 distal XLT, fever, increased secretions, abx broadened / bronch 1/22 ongoing thick secretions 1/24 Failed SBT, low grade fever, Per CXR persistent pleural effusions / basilar consolidation atelectasis vs pneumonia per bases 1/25 Mucus plugged with slow recovery after chest PT and Wound Care 2/3  first time weaned for greater than 15 minutes (previously failed due to agitation, secretions, tachypnea) 2/10 TRH assumed care 2/11 trach changed to a #5 cuffless per PCCM  2/13 trach changed to #4 shiley cuffless  2/13 afternoon - pulled out new trach and "disoldged" PEG - both reinserted/repositioned  2/13 PM > 2/14 AM acute hypoxic failure - emergent bedside bronch > trach obstructed - #6 cuffed trach placed > back to ICU on vent  2/14 trach changed to #5 cuffed 2/15 TRH resumed care - resp distress > back on vent   Subjective: The pt is  confused this morning, as per his mother. He was much more alert 2 days ago per her report. He is exhibiting increased work of breathing. He is not able to provide a reliable hx. He does not appear to be in pain, but he is altered.   Assessment & Plan:  Acute hypoxic respiratory failure - Influenza A / Corynebacterium pneumonia - Prolonged respiratory failure  Ongoing care of trach per PCCM - proving to be difficult to manage consistently   Acute systolic heart failure no plans for cardiac cath per Cardiology - EF 20-25% via TTE 11/29/18, but improved to 60-65% on f/u 1/29 - likely due to sepsis - actually appears DH at this time - cont BB and ARB Filed Weights   01/07/19 0600 01/09/19 0500 01/10/19 0426  Weight: 89.1 kg 91 kg 91.2 kg    Hypernatremia Appears a bit dehydrated - increase free water   Nutrition status post PEG tube by IR - cont tube feeds  Acute metabolic encephalopathy slow to improve in general, with another setback coincident w/ his acute decline ~36hrs ago - follow in ICU  DM2 CBG variable - follow w/o change for now   Deconditioning Will need long term rehab option once stable for d/c  Sacral decub Per wound care  DVT prophylaxis: lovenox  Code Status: FULL CODE Family Communication: spoke w/ mother at bedside Disposition Plan: ICU as continues to require vent support   Consultants:  PCCM Cardiology   Antimicrobials:  Vanco 1/3  Zosyn 1/30 Tamiflu 1/3 >1/8 1/10 vancomycin > 1/12 1/10 ceftaz >1/12; 1/21 > 1/22 1/12 emycin > 1/22 1/23 cefepime > 1/24 1/21 Vanc > 1/28  Objective: Blood pressure 120/65, pulse 81,  temperature 98.2 F (36.8 C), temperature source Oral, resp. rate (!) 26, height 6' (1.829 m), weight 91.2 kg, SpO2 99 %.  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  Liver Function Tests: Recent Labs  Lab 01/06/19 0521 01/09/19 0300  AST 21 17  ALT 32 30  ALKPHOS 88 75  BILITOT 0.3 0.2*  PROT 7.6 7.6  ALBUMIN 1.9* 2.1*    Recent Labs  Lab 01/06/19 0521  AMMONIA 19     HbA1C: Hgb A1c MFr Bld  Date/Time Value Ref Range Status  03/25/2017 05:59 AM 12.3 (H) 4.8 - 5.6 % Final    Comment:    (NOTE)         Pre-diabetes: 5.7 - 6.4         Diabetes: >6.4         Glycemic control for adults with diabetes: <7.0   12/28/2015 08:50 PM 11.7 (H) 4.8 - 5.6 % Final    Comment:    (NOTE)         Pre-diabetes: 5.7 - 6.4         Diabetes: >6.4         Glycemic control for adults with diabetes: <7.0     CBG: Recent Labs  Lab 01/09/19 1522 01/09/19 1947 01/09/19 2329 01/10/19 0342 01/10/19 0741  GLUCAP 80 138* 144* 197* 229*    Recent Results (from the past 240 hour(s))  MRSA PCR Screening     Status: None   Collection Time: 01/04/19  9:26 PM  Result Value Ref Range Status   MRSA by PCR NEGATIVE NEGATIVE Final    Comment:        The GeneXpert MRSA Assay (FDA approved for NASAL specimens only), is one component  of a comprehensive MRSA colonization surveillance program. It is not intended to diagnose MRSA infection nor to guide or monitor treatment for MRSA infections. Performed at [facility], [place] [address], [place], [place] [address]      Scheduled Meds: . aspirin  81 mg Per Tube Daily  . atorvastatin  40 mg Per Tube q1800  . carvedilol  6.25 mg Per Tube BID WC  . chlorhexidine gluconate (MEDLINE KIT)  15 mL Mouth Rinse BID  . clonazePAM  0.5  mg Per Tube BID  . enoxaparin (LOVENOX) injection  40 mg Subcutaneous Q24H  . famotidine  20 mg Per Tube BID  . folic acid  1 mg Per Tube Daily  . guaiFENesin  5 mL Per Tube Q12H  . insulin aspart  0-20 Units Subcutaneous Q4H  . insulin detemir  50 Units Subcutaneous BID  . ipratropium-albuterol  3 mL Nebulization Q6H  . losartan  25 mg Per Tube Daily  . mouth rinse  15 mL Mouth Rinse QID  . multivitamin  15 mL Per Tube Daily  . nutrition supplement (JUVEN)  1 packet Per Tube BID BM  . QUEtiapine  100 mg Per Tube BID  . sodium hypochlorite   Irrigation Daily  . thiamine  100 mg Per Tube Daily     LOS: 55 days   Cherene Altes, MD [facility] Office  [phone] Pager - Text Page per Amion  If 7PM-7AM, please contact night-coverage per Amion 01/10/2019, 10:23 AM

## 2019-01-11 ENCOUNTER — Inpatient Hospital Stay (HOSPITAL_COMMUNITY): Payer: Medicaid Other

## 2019-01-11 LAB — GLUCOSE, CAPILLARY
GLUCOSE-CAPILLARY: 147 mg/dL — AB (ref 70–99)
GLUCOSE-CAPILLARY: 105 mg/dL — AB (ref 70–99)
Glucose-Capillary: 147 mg/dL — ABNORMAL HIGH (ref 70–99)
Glucose-Capillary: 151 mg/dL — ABNORMAL HIGH (ref 70–99)
Glucose-Capillary: 153 mg/dL — ABNORMAL HIGH (ref 70–99)
Glucose-Capillary: 165 mg/dL — ABNORMAL HIGH (ref 70–99)
Glucose-Capillary: 184 mg/dL — ABNORMAL HIGH (ref 70–99)

## 2019-01-11 LAB — COMPREHENSIVE METABOLIC PANEL
ALT: 24 U/L (ref 0–44)
AST: 29 U/L (ref 15–41)
Albumin: 2 g/dL — ABNORMAL LOW (ref 3.5–5.0)
Alkaline Phosphatase: 69 U/L (ref 38–126)
Anion gap: 5 (ref 5–15)
BUN: 78 mg/dL — AB (ref 6–20)
CHLORIDE: 107 mmol/L (ref 98–111)
CO2: 33 mmol/L — ABNORMAL HIGH (ref 22–32)
Calcium: 8.8 mg/dL — ABNORMAL LOW (ref 8.9–10.3)
Creatinine, Ser: 1.14 mg/dL (ref 0.61–1.24)
GFR calc Af Amer: >60 mL/min (ref >60)
GFR calc non Af Amer: >60 mL/min (ref >60)
Glucose, Bld: 182 mg/dL — ABNORMAL HIGH (ref 70–99)
Potassium: 4.7 mmol/L (ref 3.5–5.1)
Sodium: 145 mmol/L (ref 135–145)
Total Bilirubin: 0.9 mg/dL (ref 0.3–1.2)
Total Protein: 7.4 g/dL (ref 6.5–8.1)

## 2019-01-11 LAB — CBC
HEMATOCRIT: 29.8 % — AB (ref 39.0–52.0)
Hemoglobin: 9 g/dL — ABNORMAL LOW (ref 13.0–17.0)
MCH: 26.8 pg (ref 26.0–34.0)
MCHC: 30.2 g/dL (ref 30.0–36.0)
MCV: 88.7 fL (ref 80.0–100.0)
Platelets: 318 10*3/uL (ref 150–400)
RBC: 3.36 MIL/uL — ABNORMAL LOW (ref 4.22–5.81)
RDW: 14.9 % (ref 11.5–15.5)
WBC: 9.3 10*3/uL (ref 4.0–10.5)
nRBC: 0 % (ref 0.0–0.2)

## 2019-01-11 LAB — MAGNESIUM: Magnesium: 2.4 mg/dL (ref 1.7–2.4)

## 2019-01-11 MED ORDER — TESTOSTERONE 50 MG/5GM (1%) TD GEL
5.0000 g | Freq: Every day | TRANSDERMAL | Status: DC
  Administered 2019-01-12 – 2019-01-19 (×8): 5 g via TRANSDERMAL
  Filled 2019-01-11 (×8): qty 5

## 2019-01-11 MED ORDER — CARVEDILOL 3.125 MG PO TABS
3.1250 mg | ORAL_TABLET | Freq: Two times a day (BID) | ORAL | Status: DC
  Administered 2019-01-12 – 2019-02-20 (×78): 3.125 mg
  Filled 2019-01-11 (×82): qty 1

## 2019-01-11 NOTE — Progress Notes (Signed)
At 1050 Patient on trach collar O2 saturation decreased heart rate decrease color pale more lethargic. Patient placed by on full support ventilation. Patient HR and O2 saturation increased immediately. B/P remains low patient follows commands just slower to respond. Antonio Brim NP in to see patient. Family at bedside. NP updated family and patient.

## 2019-01-11 NOTE — Progress Notes (Signed)
Pt placed back on full vent support due to obvious respiratory distress -  HR 35, RR >40, & increased WOB.  Pt recovered quickly on full vent support. RN & NP at bedside.  Will hold wean for the rest of the day per NP.

## 2019-01-11 NOTE — Progress Notes (Signed)
Pt placed on ATC trail at this time, tolerating well.  RT will continue to monitor

## 2019-01-11 NOTE — Progress Notes (Signed)
NAME:  Antonio [name]., MRN:  [id number], DOB:  06-Jul-1956, LOS: 48 ADMISSION DATE:  11/28/2018, CONSULTATION DATE:  1/3 REFERRING MD:  EDP, CHIEF COMPLAINT:  Dyspnea   Brief History   63 y/o M admitted 1/3 with influenza A causing respiratory failure and cardiogenic shock. Prolonged mechanical ventilation, tracheostomy placed on January 16.  Developed HCAP.  Difficult wean, has failed multiple attempts at downsize with mucus plugging.   Past Medical History  DM2, CAD, HTN  Significant Hospital Events   1/03  Admit, Influenza A positive 1/06  Self extubated  1/07  Re-intubated  1/08  Self-extubated  1/09  Reintubated, mucus plugging on FOB 1/10  Fever to 103, abx restarted  1/16  Diuresing, increased WOB with weaning efforts.  Trach.  1/20  Failed wean, anxiety / secretions 1/21  Trach revision to XLT distal.  FOB  1/22  Thick secretions barrier to wean  1/24  Failed SBT, fever 1/25  Episode of mucus plugging after chest PT/wound care  2/03  Weaned 15 minutes  2/11  ATC  2/14  Trach changed to #4, mucus plugging >back to #5 XLT 2/15  Failed ATC with increased WOB 2/16  Failed ATC with increased WOB  Consults:  PCCM Cardiology   Procedures:  ETT 1/3 >>1/6, 1/7 > 1/8> 1/9> 1/16 Trach 1/16 >>  PEG 1/28 >>     Significant Diagnostic Tests:  UDS 1/4 >negative  ECHO 1/4 >LVEF 20-25%, akinesis of the anteroseptal, anterolateral & apical myocardium, grade 1 diastolic dysfunction, small pericardial effusion without evidence of hemodynamic compromise. CT Chest / ABD / Pelvis 1/3 > airway thickening, motion artifact, CAD Echo 1/29> LVEF 60-65%, left ventricular hypertrophy, trivial pericardial effusion  Micro Data:  RVP 1/3 > positive for influenza A BCx2 1/3 >No growth UC 1/4 >No Growth Sputum Cx 1/7 > Normal respiratory flora BAL >corynebacterium  Blood Cx 1/10 > neg Sputum 1/21 > abundant corynebacterium striatum  Antimicrobials:  Vanco 1/3 x1;      Zosyn 1/30 x1  Tamiflu 1/3 >1/8 Vancomycin 1/10 > 1/12 Ceftaz 1/10 > 1/12; 1/21 >1/22 Emycin 1/12 > 1/22 Cefepime 1/23 > 1/24 Vanc 1/21> 1/28  Interim history/subjective:  RN reports pt had episode of mucus plugging on ATC, desaturated & hypotensive with event.  Placed back on vent, suctioned with improvement.   Objective   Blood pressure (!) 79/51, pulse 92, temperature 99.8 F (37.7 C), temperature source Oral, resp. rate 19, height 6' (1.829 m), weight 88.6 kg, SpO2 98 %.    Vent Mode: PRVC FiO2 (%):  [28 %-30 %] 30 % Set Rate:  [20 bmp] 20 bmp Vt Set:  [620 mL] 620 mL PEEP:  [5 cmH20] 5 cmH20 Plateau Pressure:  [16 cmH20-18 cmH20] 18 cmH20   Intake/Output Summary (Last 24 hours) at 01/11/2019 1153 Last data filed at 01/11/2019 0700 Gross per 24 hour  Intake 1285 ml  Output 1375 ml  Net -90 ml   Filed Weights   01/09/19 0500 01/10/19 0426 01/11/19 0400  Weight: 91 kg 91.2 kg 88.6 kg   EXAM: General: adult male lying in bed on vent in NAD  HEENT: MM pink/moist, #5XLT distal trach clean/dry Neuro: Awakens to voice, nods / follows commands, appears uncomfortable in bed / shifting off bottom > indicates he is sore  CV: s1s2 rrr, no m/r/g PULM: even/non-labored, lungs bilaterally corse  RJ:JOAC, non-tender, bsx4 active  Extremities: warm/dry, no edema, L BKA  Skin: no rashes or lesions  LABS  PULMONARY Recent Labs  Lab 01/08/19 2318 01/09/19 0218  PHART 7.327* 7.494*  PCO2ART 74.0* 47.4  PO2ART 44.3* 52.0*  HCO3 37.4* 36.1*  O2SAT 71.2 88.0    CBC Recent Labs  Lab 01/06/19 0521 01/09/19 0300 01/11/19 0241  HGB 9.4* 10.0* 9.0*  HCT 29.7* 33.0* 29.8*  WBC 7.3 8.9 9.3  PLT 323 342 318    COAGULATION No results for input(s): INR in the last 168 hours.  CARDIAC   Recent Labs  Lab 01/09/19 0300 01/09/19 0919 01/09/19 1450  TROPONINI 0.04* <0.03 0.03*   No results for input(s): PROBNP in the last 168 hours.   CHEMISTRY Recent Labs  Lab  01/06/19 0521 01/09/19 0300 01/11/19 0241  NA 144 150* 145  K 4.1 4.6 4.7  CL 101 108 107  CO2 34* 34* 33*  GLUCOSE 182* 128* 182*  BUN 45* 60* 78*  CREATININE 0.71 1.16 1.14  CALCIUM 9.1 9.4 8.8*  MG  --  2.4 2.4   Estimated Creatinine Clearance: 75.6 mL/min (by C-G formula based on SCr of 1.14 mg/dL).   LIVER Recent Labs  Lab 01/06/19 0521 01/09/19 0300 01/11/19 0241  AST _0 ALT 32 30 24  ALKPHOS 88 75 69  BILITOT 0.3 0.2* 0.9  PROT 7.6 7.6 7.4  ALBUMIN 1.9* 2.1* 2.0*     INFECTIOUS Recent Labs  Lab 01/09/19 0300 01/09/19 0620  LATICACIDVEN 1.2 1.3     ENDOCRINE CBG (last 3)  Recent Labs    01/11/19 0006 01/11/19 0404 01/11/19 0848  GLUCAP 184* 153* 147*    IMAGING  Dg Chest Port 1 View  Result Date: 01/10/2019 CLINICAL DATA:  Shortness of breath EXAM: PORTABLE CHEST 1 VIEW COMPARISON:  January 09, 2019 FINDINGS: The tracheostomy tube is stable. No pneumothorax. The cardiomediastinal silhouette is unchanged. Opacity in the right base has improved. No acute interval changes. Stable cardiomediastinal silhouette. IMPRESSION: Improving opacity in the right base. Stable tracheostomy tube. No other acute abnormalities. Electronically Signed   By: Dorise Bullion III M.D   On: 01/10/2019 14:03    Resolved Hospital Problem list   AKI Pneumonia Hypokalemia Hypernatremia  Assessment & Plan:   Acute Hypoxemic Respiratory Failure s/p Tracheostomy -in setting of Flu A, Corynebacterium PNA -multiple failed attempts at ATC -has #5XLT P: Continue PRVC as rest mode  Would plan for short interval PSV weaning efforts over the next week before advancing to ATC again. He has proven that he does not tolerate ATC at this point.  Needs slower wean process  Trach care per protocol  Wean O2 for sats 88-95% PT efforts  Continue Guaifenesin, free water  Continue chest PT  Duoneb  PMV efforts as able   Hx HTN, HLD, CAD P: Continue ASA, lipitor,  cozaar Reduce Coreg to 3.125 PT BID   DM  -poorly controlled, hx amputation P: SSI, resistant scale  Levemir 50 units BID   Moderate Protein Calorie Malnutrition  P: Continue TF per Nutrition  Nutritional supplement  MVI, folic acid PEG care per protocol   Anemia  P: Trend CBC  Transfuse per ICU guidelines  Lovenox for DVT prophylaxis   Sacral Decubitus  P: WOC following   Agitated Delirium  P: Minimize sedating medications as able  Continue seroquel BID Klonopin BID     CC Time: 30 minutes     Noe Gens, NP-C [facility] Pgr: [phone] or if no answer [phone] 01/11/2019, 12:16 PM

## 2019-01-12 ENCOUNTER — Inpatient Hospital Stay (HOSPITAL_COMMUNITY): Payer: Medicaid Other

## 2019-01-12 DIAGNOSIS — J9621 Acute and chronic respiratory failure with hypoxia: Secondary | ICD-10-CM

## 2019-01-12 LAB — BASIC METABOLIC PANEL
Anion gap: 8 (ref 5–15)
BUN: 88 mg/dL — ABNORMAL HIGH (ref 6–20)
CO2: 29 mmol/L (ref 22–32)
CREATININE: 1.29 mg/dL — AB (ref 0.61–1.24)
Calcium: 8.5 mg/dL — ABNORMAL LOW (ref 8.9–10.3)
Chloride: 107 mmol/L (ref 98–111)
GFR calc Af Amer: >60 mL/min (ref >60)
GFR, EST NON AFRICAN AMERICAN: 60 mL/min — AB (ref >60)
GLUCOSE: 135 mg/dL — AB (ref 70–99)
Potassium: 3.9 mmol/L (ref 3.5–5.1)
Sodium: 144 mmol/L (ref 135–145)

## 2019-01-12 LAB — GLUCOSE, CAPILLARY
Glucose-Capillary: 121 mg/dL — ABNORMAL HIGH (ref 70–99)
Glucose-Capillary: 107 mg/dL — ABNORMAL HIGH (ref 70–99)
Glucose-Capillary: 125 mg/dL — ABNORMAL HIGH (ref 70–99)
Glucose-Capillary: 151 mg/dL — ABNORMAL HIGH (ref 70–99)
Glucose-Capillary: 58 mg/dL — ABNORMAL LOW (ref 70–99)
Glucose-Capillary: 122 mg/dL — ABNORMAL HIGH (ref 70–99)

## 2019-01-12 LAB — CBC
HCT: 27.5 % — ABNORMAL LOW (ref 39.0–52.0)
Hemoglobin: 8.4 g/dL — ABNORMAL LOW (ref 13.0–17.0)
MCH: 27.3 pg (ref 26.0–34.0)
MCHC: 30.5 g/dL (ref 30.0–36.0)
MCV: 89.3 fL (ref 80.0–100.0)
Platelets: 266 10*3/uL (ref 150–400)
RBC: 3.08 MIL/uL — ABNORMAL LOW (ref 4.22–5.81)
RDW: 15.2 % (ref 11.5–15.5)
WBC: 10.1 10*3/uL (ref 4.0–10.5)
nRBC: 0 % (ref 0.0–0.2)

## 2019-01-12 MED ORDER — DEXTROSE 50 % IV SOLN
INTRAVENOUS | Status: AC
  Filled 2019-01-12: qty 50

## 2019-01-12 MED ORDER — DEXTROSE 50 % IV SOLN
INTRAVENOUS | Status: AC
  Administered 2019-01-12: 25 mL
  Filled 2019-01-12: qty 50

## 2019-01-12 MED ORDER — DAKINS (1/4 STRENGTH) 0.125 % EX SOLN
Freq: Every day | CUTANEOUS | Status: AC
  Administered 2019-01-12 – 2019-01-16 (×5)
  Filled 2019-01-12: qty 473

## 2019-01-12 NOTE — Progress Notes (Signed)
NAME:  Antonio [name]., MRN:  [id number], DOB:  1956/02/05, LOS: 47 ADMISSION DATE:  11/28/2018, CONSULTATION DATE:  1/3 REFERRING MD:  EDP, CHIEF COMPLAINT:  Dyspnea   Brief History   63 y/o M admitted 1/3 with influenza A causing respiratory failure and cardiogenic shock. Prolonged mechanical ventilation, tracheostomy placed on January 16.  Developed HCAP.  Difficult wean, has failed multiple attempts at downsize with mucus plugging.   Past Medical History  DM2, CAD, HTN, HLD, tobacco use  Significant Hospital Events   1/03  Admit, Influenza A positive 1/06  Self extubated  1/07  Re-intubated  1/08  Self-extubated  1/09  Reintubated, mucus plugging on FOB 1/10  Fever to 103, abx restarted  1/16  Diuresing, increased WOB with weaning efforts.  Trach.  1/20  Failed wean, anxiety / secretions 1/21  Trach revision to XLT distal.  FOB  1/22  Thick secretions barrier to wean  1/24  Failed SBT, fever 1/25  Episode of mucus plugging after chest PT/wound care  2/03  Weaned 15 minutes  2/11  ATC  2/14  Trach changed to #4, mucus plugging >back to #5 XLT 2/15  Failed ATC with increased WOB 2/16  Failed ATC with increased WOB   Consults:  PCCM Cardiology   Procedures:  ETT 1/3 >>1/6, 1/7 > 1/8> 1/9> 1/16 Trach 1/16 >>  PEG 1/28 >>     Significant Diagnostic Tests:  UDS 1/4 >negative  ECHO 1/4 >LVEF 20-25%, akinesis of the anteroseptal, anterolateral & apical myocardium, grade 1 diastolic dysfunction, small pericardial effusion without evidence of hemodynamic compromise. CT Chest / ABD / Pelvis 1/3 > airway thickening, motion artifact, CAD Echo 1/29> LVEF 60-65%, left ventricular hypertrophy, trivial pericardial effusion  Micro Data:  RVP 1/3 > positive for influenza A BCx2 1/3 >No growth UC 1/4 >No Growth Sputum Cx 1/7 > Normal respiratory flora BAL >corynebacterium  Blood Cx 1/10 > neg Sputum 1/21 > abundant corynebacterium striatum  Antimicrobials:    Vanco 1/3 x1;    Zosyn 1/30 x1  Tamiflu 1/3 >1/8 Vancomycin 1/10 > 1/12 Ceftaz 1/10 > 1/12; 1/21 >1/22 Emycin 1/12 > 1/22 Cefepime 1/23 > 1/24 Vanc 1/21> 1/28  Interim history/subjective:  Alerts to voice but nonverbal. Attempted ATC trial yesterday but required full vent support. No events noted overnight.  Objective   Blood pressure 98/63, pulse 82, temperature 98.3 F (36.8 C), temperature source Oral, resp. rate (!) 21, height 6' (1.829 m), weight 89.4 kg, SpO2 100 %.    Vent Mode: PSV;CPAP FiO2 (%):  [30 %] 30 % Set Rate:  [20 bmp] 20 bmp Vt Set:  [620 mL] 620 mL PEEP:  [5 cmH20] 5 cmH20 Pressure Support:  [10 cmH20] 10 cmH20 Plateau Pressure:  [16 cmH20-18 cmH20] 16 cmH20   Intake/Output Summary (Last 24 hours) at 01/12/2019 0921 Last data filed at 01/12/2019 0700 Gross per 24 hour  Intake 1680 ml  Output 825 ml  Net 855 ml   Filed Weights   01/10/19 0426 01/11/19 0400 01/12/19 0400  Weight: 91.2 kg 88.6 kg 89.4 kg   EXAM:  General: acutely ill appearing, lying in bed, on vent  HEENT: MMM, trach in place Neuro: alerts to voice, nonverbal. Does not follow commands  CV: RRR PULM: normal WOB, CTAB.  GI: soft, occasional guarding. +BS  Extremities: warm, dry. L BKA. No LE edema Skin: large sacral ulcer  Lines/Access:  Trach R PIV G tube Urinary cath  LABS   PULMONARY Recent  Labs  Lab 01/08/19 2318 01/09/19 0218  PHART 7.327* 7.494*  PCO2ART 74.0* 47.4  PO2ART 44.3* 52.0*  HCO3 37.4* 36.1*  O2SAT 71.2 88.0   CBC Recent Labs  Lab 01/09/19 0300 01/11/19 0241 01/12/19 0324  HGB 10.0* 9.0* 8.4*  HCT 33.0* 29.8* 27.5*  WBC 8.9 9.3 10.1  PLT 342 318 266   COAGULATION No results for input(s): INR in the last 168 hours.  CARDIAC   Recent Labs  Lab 01/09/19 0300 01/09/19 0919 01/09/19 1450  TROPONINI 0.04* <0.03 0.03*   No results for input(s): PROBNP in the last 168 hours.  CHEMISTRY Recent Labs  Lab 01/06/19 0521 01/09/19 0300  01/11/19 0241 01/12/19 0324  NA 144 150* 145 144  K 4.1 4.6 4.7 3.9  CL 101 108 107 107  CO2 34* 34* 33* 29  GLUCOSE 182* 128* 182* 135*  BUN 45* 60* 78* 88*  CREATININE 0.71 1.16 1.14 1.29*  CALCIUM 9.1 9.4 8.8* 8.5*  MG  --  2.4 2.4  --    Estimated Creatinine Clearance: 66.8 mL/min (A) (by C-G formula based on SCr of 1.29 mg/dL (H)).  LIVER Recent Labs  Lab 01/06/19 0521 01/09/19 0300 01/11/19 0241  AST _0 ALT 32 30 24  ALKPHOS 88 75 69  BILITOT 0.3 0.2* 0.9  PROT 7.6 7.6 7.4  ALBUMIN 1.9* 2.1* 2.0*   INFECTIOUS Recent Labs  Lab 01/09/19 0300 01/09/19 0620  LATICACIDVEN 1.2 1.3   ENDOCRINE CBG (last 3)  Recent Labs    01/11/19 2320 01/12/19 0303 01/12/19 0756  GLUCAP 105* 121* 107*   IMAGING  Dg Chest Port 1 View  Result Date: 01/12/2019 CLINICAL DATA:  63 year old male with influenza, respiratory failure, cardiogenic shock. EXAM: PORTABLE CHEST 1 VIEW COMPARISON:  01/11/2019 and earlier. FINDINGS: Portable AP semi upright view at 0243 hours. Stable tracheostomy tube. Stable cardiac size and mediastinal contours. No pneumothorax or pulmonary edema. Similar lung volumes, but increased dense retrocardiac opacity greater on the right. No definite pleural effusion. Paucity of bowel gas in the upper abdomen. IMPRESSION: 1. Bilateral lower lobe collapse or consolidation appears increased and greater on the right. 2. Stable tracheostomy tube. Electronically Signed   By: Genevie Ann M.D.   On: 01/12/2019 03:56   Dg Chest Port 1 View  Result Date: 01/11/2019 CLINICAL DATA:  Acute respiratory distress. EXAM: PORTABLE CHEST 1 VIEW COMPARISON:  Radiographs 01/10/2019 and 01/09/2019. FINDINGS: 1257 hours. Tracheostomy appears stable. The heart size and mediastinal contours are stable. There are stable right greater than left patchy airspace opacities. No edema, significant pleural effusion or pneumothorax. IMPRESSION: No significant change. Persistent  right-greater-than-left basilar airspace opacities suspicious for aspiration. Electronically Signed   By: Richardean Sale M.D.   On: 01/11/2019 13:54   Dg Chest Port 1 View  Result Date: 01/10/2019 CLINICAL DATA:  Shortness of breath EXAM: PORTABLE CHEST 1 VIEW COMPARISON:  January 09, 2019 FINDINGS: The tracheostomy tube is stable. No pneumothorax. The cardiomediastinal silhouette is unchanged. Opacity in the right base has improved. No acute interval changes. Stable cardiomediastinal silhouette. IMPRESSION: Improving opacity in the right base. Stable tracheostomy tube. No other acute abnormalities. Electronically Signed   By: Dorise Bullion III M.D   On: 01/10/2019 14:03   Resolved Hospital Problem list   AKI Pneumonia Hypokalemia Hypernatremia  Assessment & Plan:   RESP Acute Hypoxemic Respiratory Failure s/p Tracheostomy In setting of Flu A, Corynebacterium PNA, now off abx. Multiple failed attempts at [facility], most recent  2/16. Has #5XLT. - Will attempt to wean slowly prior to attempting ATC again. Continue PRVC as rest mode  - Trach care per protocol  - Wean O2 for sats 88-95% - PT efforts  - Continue Guaifenesin, free water  - Continue chest PT  - Duoneb  - PMV efforts as able   CARDIAC Hx HTN, HLD, CAD BP low normal.  - Continue ASA, lipitor, cozaar, coreg 3.120m BID  ENDOCRINE DM - H/o poorly controlled, hx amputation. CBGs 100s. - SSI, resistant scale  - Levemir 50 units BID   NEURO Agitated Delirium  - Minimize sedating medications as able  - Continue seroquel BID - Klonopin BID   HEME Anemia - Hb 8.4. No overt signs of bleeding. - Trend CBC  - Transfuse per ICU guidelines  - Lovenox for DVT prophylaxis   DERM Sacral Decubitus Ulcer WOC following   FEN/GI Moderate Protein Calorie Malnutrition  - Continue TF per Nutrition  - Nutritional supplement  - MVI, folic acid - PEG care per protocol  - colace, dulcolax prn  CC Time:     ARory Percy  DO PGY-2, [facility] 01/12/2019 2:22 PM

## 2019-01-12 NOTE — Consult Note (Signed)
WOC Nurse wound follow up Patient receiving care in [facility] 2M11.  No family present.  PT staff present for hydrotherapy treatment.  Dakin's solution continued for additional 5 days. Wound type: Unstageable PI to sacrum Measurement: Please see PT's note from today.  Helmut Muster, RN, MSN, CWOCN, CNS-BC, pager [phone]

## 2019-01-12 NOTE — Progress Notes (Signed)
Physical Therapy Wound Treatment Patient Details  Name: Antonio [name]. MRN: [id number] Date of Birth: 10/14/1956  Today's Date: 01/12/2019 Time: 0920-0950 Time Calculation (min): 30 min  Subjective  Subjective: Pt asking to have his head raised Patient and Family Stated Goals: Pt on vent Date of Onset: (unknown) Prior Treatments: dressing changes  Pain Score:  Premedicated. Grimace with tape removal.  Wound Assessment  Pressure Injury 12/01/18 Unstageable - Full thickness tissue loss in which the base of the ulcer is covered by slough (yellow, tan, gray, green or brown) and/or eschar (tan, brown or black) in the wound bed. sacrum (Active)  Dressing Type Barrier Film (skin prep);ABD;Moist to moist;Gauze (Comment) 01/12/2019  9:51 AM  Dressing Clean;Dry;Intact;Changed 01/12/2019  9:51 AM  Dressing Change Frequency Daily 01/12/2019  9:51 AM  State of Healing Early/partial granulation 01/12/2019  9:51 AM  Site / Wound Assessment Red;Yellow;Pink 01/12/2019  9:51 AM  % Wound base Red or Granulating 70% 01/12/2019  9:51 AM  % Wound base Yellow/Fibrinous Exudate 30% 01/12/2019  9:51 AM  % Wound base Black/Eschar 0% 01/12/2019  9:51 AM  % Wound base Other/Granulation Tissue (Comment) 0% 01/12/2019  9:51 AM  Peri-wound Assessment Erythema (blanchable) 01/12/2019  9:51 AM  Wound Length (cm) 7.5 cm 01/07/2019  3:34 PM  Wound Width (cm) 7 cm 01/07/2019  3:34 PM  Wound Depth (cm) 0.1 cm 01/07/2019  3:34 PM  Wound Surface Area (cm^2) 52.5 cm^2 01/07/2019  3:34 PM  Wound Volume (cm^3) 5.25 cm^3 01/07/2019  3:34 PM  Tunneling (cm) 0 12/31/2018 10:28 AM  Margins Unattached edges (unapproximated) 01/12/2019  9:51 AM  Drainage Amount Moderate 01/12/2019  9:51 AM  Drainage Description Purulent 01/12/2019  9:51 AM  Treatment Cleansed;Other (Comment) 01/11/2019  4:00 PM    Dakin's soaked guaze   Hydrotherapy Pulsed lavage therapy - wound location: sacrum Pulsed Lavage with Suction (psi): 8 psi Pulsed Lavage  with Suction - Normal Saline Used: 1000 mL Pulsed Lavage Tip: Tip with splash shield Selective Debridement Selective Debridement - Location: sacrum Selective Debridement - Tools Used: Forceps;Scissors Selective Debridement - Tissue Removed: yellow necrotic tissue   Wound Assessment and Plan  Wound Therapy - Assess/Plan/Recommendations Wound Therapy - Clinical Statement: Able to open up area at superior portion of wound with removal of yellow slough. Wound Therapy - Functional Problem List: decr mobility Factors Delaying/Impairing Wound Healing: Diabetes Mellitus;Immobility;Multiple medical problems Hydrotherapy Plan: Debridement;Dressing change;Patient/family education;Pulsatile lavage with suction Wound Therapy - Frequency: 3X / week Wound Therapy - Follow Up Recommendations: Skilled nursing facility Wound Plan: see above  Wound Therapy Goals- Improve the function of patient's integumentary system by progressing the wound(s) through the phases of wound healing (inflammation - proliferation - remodeling) by: Decrease Necrotic Tissue to: 20 Decrease Necrotic Tissue - Progress: Progressing toward goal Increase Granulation Tissue to: 80 Increase Granulation Tissue - Progress: Progressing toward goal  Goals will be updated until maximal potential achieved or discharge criteria met.  Discharge criteria: when goals achieved, discharge from hospital, MD decision/surgical intervention, no progress towards goals, refusal/missing three consecutive treatments without notification or medical reason.  GP     Shary Decamp [facility] 01/12/2019, 9:55 AM [name] Pager [phone] Office [phone]

## 2019-01-12 NOTE — Progress Notes (Signed)
SLP Cancellation Note  Patient Details Name: Antonio [name]. MRN: [id number] DOB: Nov 05, 1956   Cancelled treatment:       Reason Eval/Treat Not Completed: Patient not medically ready per conversation with RT.  SLP will follow closely for readiness for interventions (PMV/swallow).  [name], MA CCC/SLP Acute Rehabilitation Services Office number [phone] Pager [phone]    [name] 01/12/2019, 11:09 AM

## 2019-01-13 LAB — BASIC METABOLIC PANEL
Anion gap: 9 (ref 5–15)
BUN: 85 mg/dL — ABNORMAL HIGH (ref 6–20)
CO2: 29 mmol/L (ref 22–32)
CREATININE: 1.05 mg/dL (ref 0.61–1.24)
Calcium: 8.2 mg/dL — ABNORMAL LOW (ref 8.9–10.3)
Chloride: 105 mmol/L (ref 98–111)
GFR calc Af Amer: >60 mL/min (ref >60)
GFR calc non Af Amer: >60 mL/min (ref >60)
Glucose, Bld: 109 mg/dL — ABNORMAL HIGH (ref 70–99)
Potassium: 3.8 mmol/L (ref 3.5–5.1)
SODIUM: 143 mmol/L (ref 135–145)

## 2019-01-13 LAB — GLUCOSE, CAPILLARY
Glucose-Capillary: 87 mg/dL (ref 70–99)
Glucose-Capillary: 88 mg/dL (ref 70–99)
Glucose-Capillary: 83 mg/dL (ref 70–99)
Glucose-Capillary: 124 mg/dL — ABNORMAL HIGH (ref 70–99)
Glucose-Capillary: 77 mg/dL (ref 70–99)
Glucose-Capillary: 201 mg/dL — ABNORMAL HIGH (ref 70–99)

## 2019-01-13 LAB — CBC
HCT: 27.1 % — ABNORMAL LOW (ref 39.0–52.0)
Hemoglobin: 7.9 g/dL — ABNORMAL LOW (ref 13.0–17.0)
MCH: 26.2 pg (ref 26.0–34.0)
MCHC: 29.2 g/dL — ABNORMAL LOW (ref 30.0–36.0)
MCV: 90 fL (ref 80.0–100.0)
Platelets: 244 10*3/uL (ref 150–400)
RBC: 3.01 MIL/uL — ABNORMAL LOW (ref 4.22–5.81)
RDW: 15.5 % (ref 11.5–15.5)
WBC: 7.2 10*3/uL (ref 4.0–10.5)
nRBC: 0 % (ref 0.0–0.2)

## 2019-01-13 MED ORDER — ARFORMOTEROL TARTRATE 15 MCG/2ML IN NEBU
15.0000 ug | INHALATION_SOLUTION | Freq: Two times a day (BID) | RESPIRATORY_TRACT | Status: DC
  Administered 2019-01-13 – 2019-02-20 (×76): 15 ug via RESPIRATORY_TRACT
  Filled 2019-01-13 (×81): qty 2

## 2019-01-13 MED ORDER — INSULIN DETEMIR 100 UNIT/ML ~~LOC~~ SOLN
35.0000 [IU] | Freq: Two times a day (BID) | SUBCUTANEOUS | Status: DC
  Administered 2019-01-13 (×2): 35 [IU] via SUBCUTANEOUS
  Filled 2019-01-13 (×3): qty 0.35

## 2019-01-13 MED ORDER — METHYLPREDNISOLONE SODIUM SUCC 125 MG IJ SOLR
40.0000 mg | Freq: Every day | INTRAMUSCULAR | Status: AC
  Administered 2019-01-13 – 2019-01-15 (×3): 40 mg via INTRAVENOUS
  Filled 2019-01-13 (×3): qty 2

## 2019-01-13 MED ORDER — BUDESONIDE 0.5 MG/2ML IN SUSP
0.5000 mg | Freq: Two times a day (BID) | RESPIRATORY_TRACT | Status: DC
  Administered 2019-01-13 – 2019-02-20 (×77): 0.5 mg via RESPIRATORY_TRACT
  Filled 2019-01-13 (×81): qty 2

## 2019-01-13 MED ORDER — SODIUM CHLORIDE 0.9 % IV BOLUS
1000.0000 mL | Freq: Once | INTRAVENOUS | Status: AC
  Administered 2019-01-13: 1000 mL via INTRAVENOUS

## 2019-01-13 NOTE — Progress Notes (Signed)
SLP Cancellation Note  Patient Details Name: Antonio [name]. MRN: [id number] DOB: 1955-12-05   Cancelled treatment:       Reason Eval/Treat Not Completed: Patient not medically ready. Discussed pt with RN, who shares that he has copious secretions. Given his heavy secretion burden and questionable mentation, she does not believe he is ready for inline PMV at this time. Will continue to follow.   Virl Axe Jahmire Ruffins 01/13/2019, 1:47 PM  Natalia Leatherwood, M.A. CCC-SLP Acute Herbalist [phone] Office [phone]

## 2019-01-13 NOTE — Progress Notes (Signed)
Occupational Therapy Treatment Patient Details Name: Antonio [name]. MRN: [id number] DOB: 12/28/1955 Today's Date: 01/13/2019    History of present illness 63 yo admitted with flu A and PNA with acute respiratory failure intubated 1/4, trach 1/16 with course complicated by acute MI, CHF and cardiogenic shock and mucous plugging. Transferred to ICU late 2/13/early 2/14 due to dyspnea and drop in sats with thick mucous.PMhx: DM, HTN, HLD, CAD, Lt BKA, rt toe amputation   OT comments  This 63 yo male admitted with above presents to acute OT with making progress with sitting balance (tolerated 5-7 minutes EOB x 2 today), following commands (followed 75% of one step commands). Pt also worked well on sitting balance today. He will continue to benefit from acute OT with follow up OT on LTACH.  Follow Up Recommendations  LTACH    Equipment Recommendations  Other (comment)(TBD next venue)       Precautions / Restrictions Precautions Precautions: Fall;Other (comment) Precaution Comments: monitor vent/respiratory status- lots of secreations worth deep suctioning prior to and possibly after mobility.  Required Braces or Orthoses: Other Brace Other Brace: LLE prothesis       Mobility Bed Mobility Overal bed mobility: Needs Assistance Bed Mobility: Rolling;Supine to Sit;Sit to Supine Rolling: Max assist;+2 for physical assistance   Supine to sit: Mod assist;+2 for physical assistance;HOB elevated Sit to supine: +2 for physical assistance;Mod assist;HOB elevated   General bed mobility comments: Two person max assist to roll bil for peri care and dressing change, mod assist to come to sitting EOB to support trunk and legs, multimodal tactile and verbal cues as well as manual facilitation to scoot EOB.   Transfers Overall transfer level: Needs assistance Equipment used: (chair backwards) Transfers: Sit to/from Stand Sit to Stand: Mod assist;+2 physical assistance         General  transfer comment: Two person mod assist to stand EOB with chair for upper body stability.  Cues for hand placement once standing and encouragement to participate.  Prosthesis donned with total assist EOB as well as R shoe (donned in bed).  We were unable to stand a second time as "PAW high" vent pressures were elevated and pt was working harder to breathe and coughing more.  RN deep suctioned at the end of our session.     Balance Overall balance assessment: Needs assistance Sitting-balance support: Feet supported;Bilateral upper extremity supported Sitting balance-Leahy Scale: Fair Sitting balance - Comments: min guard to close supervision EOB working on EOB tolerance (sat for ~5-7 minutes before standing and after standing), sitting balance (OT had pt reach down to see if he could assist in doffing his prosthesis).     Standing balance support: Bilateral upper extremity supported;During functional activity Standing balance-Leahy Scale: Poor Standing balance comment: bil UE supported on back of chair and two therapists there to provide external support and line management. Pt stood <30 seconds before requesting to sit down.                            ADL either performed or assessed with clinical judgement   ADL Overall ADL's : Needs assistance/impaired                                       General ADL Comments: total A to don right sock and shoe as well as left prosthetic  sleeve and prosthesis; total to doff right sock and shoe and Max A to doff right prosthetic sleeve and prothesis while seated EOB.               Cognition Arousal/Alertness: Awake/alert Behavior During Therapy: Restless;Flat affect Overall Cognitive Status: Impaired/Different from baseline Area of Impairment: Following commands;Safety/judgement;Awareness;Problem solving                   Current Attention Level: Sustained   Following Commands: Follows one step commands with  increased time Safety/Judgement: Decreased awareness of safety;Decreased awareness of deficits Awareness: Intellectual Problem Solving: Slow processing;Decreased initiation;Difficulty sequencing;Requires verbal cues;Requires tactile cues General Comments: Pt asking for water throughout session, not processing why he cannot have water at this time.  Pt able to follow some basic commands with multimodal cues, at times resistant to what is asked of him, but ultimately agreeable.  Improved cognition compared to last session per OT report (last session pt argued about what leg the prosthesis is supposed to be donned- arguing for his right leg and it goes on his left leg. )              General Comments Pt on PRVC 30% PEEP 5, RR in the 20s-30s, HR stable.  BPs stable.     Pertinent Vitals/ Pain       Pain Assessment: Faces Faces Pain Scale: Hurts little more Pain Location: generalized with mobility, and with peri care due to sacral wound Pain Descriptors / Indicators: Grimacing;Guarding Pain Intervention(s): Limited activity within patient's tolerance;Monitored during session;Repositioned         Frequency  Min 2X/week        Progress Toward Goals  OT Goals(current goals can now be found in the care plan section)  Progress towards OT goals: Progressing toward goals  Acute Rehab OT Goals Patient Stated Goal: pt continues to perseverate on water and eating  Plan Discharge plan remains appropriate;Frequency remains appropriate    Co-evaluation    PT/OT/SLP Co-Evaluation/Treatment: Yes Reason for Co-Treatment: Complexity of the patient's impairments (multi-system involvement);Necessary to address cognition/behavior during functional activity;For patient/therapist safety;To address functional/ADL transfers PT goals addressed during session: Mobility/safety with mobility;Balance;Strengthening/ROM OT goals addressed during session: ADL's and self-care;Strengthening/ROM      AM-PAC  OT "6 Clicks" Daily Activity     Outcome Measure   Help from another person eating meals?: Total Help from another person taking care of personal grooming?: A Lot Help from another person toileting, which includes using toliet, bedpan, or urinal?: A Lot Help from another person bathing (including washing, rinsing, drying)?: A Lot Help from another person to put on and taking off regular upper body clothing?: A Lot Help from another person to put on and taking off regular lower body clothing?: Total 6 Click Score: 10    End of Session Equipment Utilized During Treatment: Oxygen(30% and 5 liters, recliner back in front of him to hold onto while standing)  OT Visit Diagnosis: Unsteadiness on feet (R26.81);Other abnormalities of gait and mobility (R26.89);Muscle weakness (generalized) (M62.81);Other symptoms and signs involving cognitive function   Activity Tolerance Patient tolerated treatment well(for what he did, but not able to stand long, nor wanting to try and stand again. (was increased work of breathing with standing))   Patient Left in bed;with call bell/phone within reach;with bed alarm set;with restraints reapplied(Bil wrist restraints)   Nurse Communication (needs pad on sacrum changed, we A'd RN with this)        Time: 5621-30861314-1344 OT  Time Calculation (min): 30 min  Charges: OT General Charges $OT Visit: 1 Visit OT Treatments $Self Care/Home Management : 8-22 mins  Ignacia Palma, OTR/L Acute [employer] Pager [phone] Office [phone]      Evette Georges 01/13/2019, 2:40 PM

## 2019-01-13 NOTE — Progress Notes (Signed)
Physical Therapy Treatment Patient Details Name: Antonio [name]. MRN: [id number] DOB: 1956-06-29 Today's Date: 01/13/2019    History of Present Illness 63 yo admitted with flu A and PNA with acute respiratory failure intubated 1/4, trach 1/16 with course complicated by acute MI, CHF and cardiogenic shock and mucous plugging. Transferred to ICU late 2/13/early 2/14 due to dyspnea and drop in sats with thick mucous.PMhx: DM, HTN, HLD, CAD, Lt BKA, rt toe amputation    PT Comments    Pt tolerated EOB and one attempt at standing EOB while on full ventilatory support.  He did have thick, copious secretions requiring deep suction by RN at the end of mobility.  His cognition remains impaired, but seems to be improved from earlier descriptions.  Vent pressure was high, but pt was also coughing quite a bit during mobility.   Vent Settings: Mode:  PRVC     FiO2: 30%     PEEP:  5      RR: 20-30       HR: 80s              BP:120/64          Follow Up Recommendations  Supervision/Assistance - 24 hour;SNF     Equipment Recommendations  Rolling walker with 5" wheels;Wheelchair (measurements PT);Wheelchair cushion (measurements PT);Hospital bed;Other (comment)(hospital bed with air mattress overlay)    Recommendations for Other Services   NA     Precautions / Restrictions Precautions Precautions: Fall;Other (comment) Precaution Comments: monitor vent/respiratory status- lots of secreations worth deep suctioning prior to and possibly after mobility.  Required Braces or Orthoses: Other Brace Other Brace: LLE prothesis    Mobility  Bed Mobility Overal bed mobility: Needs Assistance Bed Mobility: Rolling;Supine to Sit;Sit to Supine Rolling: Max assist;+2 for physical assistance   Supine to sit: Mod assist;+2 for physical assistance;HOB elevated Sit to supine: +2 for physical assistance;Mod assist;HOB elevated   General bed mobility comments: Two person max assist to roll bil for peri care  and dressing change, mod assist to come to sitting EOB to support trunk and legs, multimodal tactile and verbal cues as well as manual facilitation to scoot EOB.   Transfers Overall transfer level: Needs assistance Equipment used: (chair backwards) Transfers: Sit to/from Stand Sit to Stand: Mod assist;+2 physical assistance         General transfer comment: Two person mod assist to stand EOB with chair for upper body stability.  Cues for hand placement once standing and encouragement to participate.  Prosthesis donned with total assist EOB as well as R shoe (donned in bed).  We were unable to stand a second time as "PAW high" vent pressures were elevated and pt was working harder to breathe and coughing more.  RN deep suctioned at the end of our session.          Balance Overall balance assessment: Needs assistance Sitting-balance support: Feet supported;Bilateral upper extremity supported Sitting balance-Leahy Scale: Fair Sitting balance - Comments: min guard to close supervision EOB working on EOB tolerance, sitting balance (OT had pt reach down to see if he could assist in doffing his prosthesis).     Standing balance support: Bilateral upper extremity supported;During functional activity Standing balance-Leahy Scale: Poor Standing balance comment: bil UE supported on back of chair and two therapists there to provide external support and line management. Pt stood <30 seconds before requesting to sit down.  Cognition Arousal/Alertness: Awake/alert Behavior During Therapy: Restless;Flat affect Overall Cognitive Status: Impaired/Different from baseline Area of Impairment: Following commands;Safety/judgement;Awareness;Problem solving                   Current Attention Level: Sustained   Following Commands: Follows one step commands with increased time Safety/Judgement: Decreased awareness of safety;Decreased awareness of  deficits Awareness: Intellectual Problem Solving: Slow processing;Decreased initiation;Difficulty sequencing;Requires verbal cues;Requires tactile cues General Comments: Pt asking for water throughout session, not processing why he cannot have water at this time.  Pt able to follow some basic commands with multimodal cues, at times resistant to what is asked of him, but ultimately agreeable.  Improved cognition compared to last session per OT report (last session pt argued about what leg the prosthesis is supposed to be donned- arguing for his right leg and it goes on his left leg. )         General Comments General comments (skin integrity, edema, etc.): Pt on PRVC 30% PEEP 5, RR in the 20s-30s, HR stable.  BPs stable.       Pertinent Vitals/Pain Pain Assessment: Faces Faces Pain Scale: Hurts little more Pain Location: generalized with mobility, and with peri care due to sacral wound Pain Descriptors / Indicators: Grimacing;Guarding Pain Intervention(s): Limited activity within patient's tolerance;Monitored during session;Repositioned           PT Goals (current goals can now be found in the care plan section) Acute Rehab PT Goals Patient Stated Goal: pt continues to perseverate on water and eating Progress towards PT goals: Progressing toward goals    Frequency    Min 2X/week      PT Plan Frequency needs to be updated    Co-evaluation PT/OT/SLP Co-Evaluation/Treatment: Yes Reason for Co-Treatment: Complexity of the patient's impairments (multi-system involvement);Necessary to address cognition/behavior during functional activity;For patient/therapist safety;To address functional/ADL transfers PT goals addressed during session: Mobility/safety with mobility;Balance;Strengthening/ROM        AM-PAC PT "6 Clicks" Mobility   Outcome Measure  Help needed turning from your back to your side while in a flat bed without using bedrails?: A Lot Help needed moving from lying on  your back to sitting on the side of a flat bed without using bedrails?: A Lot Help needed moving to and from a bed to a chair (including a wheelchair)?: A Lot Help needed standing up from a chair using your arms (e.g., wheelchair or bedside chair)?: A Lot Help needed to walk in hospital room?: Total Help needed climbing 3-5 steps with a railing? : Total 6 Click Score: 10    End of Session Equipment Utilized During Treatment: Other (comment)(vent) Activity Tolerance: Patient limited by fatigue Patient left: in bed;with call bell/phone within reach;with bed alarm set Nurse Communication: Mobility status PT Visit Diagnosis: Muscle weakness (generalized) (M62.81);Difficulty in walking, not elsewhere classified (R26.2)     Time: 2641-5830 PT Time Calculation (min) (ACUTE ONLY): 30 min  Charges:  $Therapeutic Activity: 8-22 mins                    Isys Tietje B. Juliona Vales, PT, DPT  Acute Rehabilitation [phone] pager #[phone] office   01/13/2019, 2:26 PM

## 2019-01-13 NOTE — Progress Notes (Addendum)
eLink Physician-Brief Progress Note Patient Name: Antonio [name]. DOB: 1956-05-24 MRN: [id number]   Date of Service  01/13/2019  HPI/Events of Note  Hypotension - BP = 89/55 with MAP = 66. LVEF = 60-65%  eICU Interventions  Will bolus with 0.9 NaCl 1 liter IV over 1 hour now.      Intervention Category Major Interventions: Hypotension - evaluation and management  [name] 01/13/2019, 2:52 AM

## 2019-01-13 NOTE — Progress Notes (Addendum)
NAME:  Antonio Studnicka., MRN:  [id number], DOB:  1956/05/06, LOS: 25 ADMISSION DATE:  11/28/2018, CONSULTATION DATE:  1/3 REFERRING MD:  EDP, CHIEF COMPLAINT:  Dyspnea   Brief History   63 y/o M admitted 1/3 with influenza A causing respiratory failure and cardiogenic shock. Prolonged mechanical ventilation, tracheostomy placed on January 16.  Developed HCAP.  Difficult wean, has failed multiple attempts at downsize with mucus plugging.   Past Medical History  DM2, CAD, HTN, HLD, tobacco use  Significant Hospital Events   1/03  Admit, Influenza A positive 1/06  Self extubated  1/07  Re-intubated  1/08  Self-extubated  1/09  Reintubated, mucus plugging on FOB 1/10  Fever to 103, abx restarted  1/16  Diuresing, increased WOB with weaning efforts.  Trach.  1/20  Failed wean, anxiety / secretions 1/21  Trach revision to XLT distal.  FOB  1/22  Thick secretions barrier to wean  1/24  Failed SBT, fever 1/25  Episode of mucus plugging after chest PT/wound care  2/03  Weaned 15 minutes  2/11  ATC  2/14  Trach changed to #4, mucus plugging >back to #5 XLT 2/15  Failed ATC with increased WOB 2/16  Failed ATC with increased WOB   Consults:  PCCM Cardiology   Procedures:  ETT 1/3 >>1/6, 1/7 > 1/8> 1/9> 1/16 Trach 1/16 >>  PEG 1/28 >>     Significant Diagnostic Tests:  UDS 1/4 >negative  ECHO 1/4 >LVEF 20-25%, akinesis of the anteroseptal, anterolateral & apical myocardium, grade 1 diastolic dysfunction, small pericardial effusion without evidence of hemodynamic compromise. CT Chest / ABD / Pelvis 1/3 > airway thickening, motion artifact, CAD Echo 1/29> LVEF 60-65%, left ventricular hypertrophy, trivial pericardial effusion  Micro Data:  RVP 1/3 > positive for influenza A BCx2 1/3 >No growth UC 1/4 >No Growth Sputum Cx 1/7 > Normal respiratory flora BAL >corynebacterium  Blood Cx 1/10 > neg Sputum 1/21 > abundant corynebacterium striatum  Antimicrobials:    Vanco 1/3 x1;    Zosyn 1/30 x1  Tamiflu 1/3 >1/8 Vancomycin 1/10 > 1/12 Ceftaz 1/10 > 1/12; 1/21 >1/22 Emycin 1/12 > 1/22 Cefepime 1/23 > 1/24 Vanc 1/21> 1/28  Interim history/subjective:  Much more alert today. Episode of asymptomatic hypotension last night, received 1L NS bolus.  Objective   Blood pressure (!) 90/52, pulse 73, temperature 98.3 F (36.8 C), temperature source Oral, resp. rate 18, height 6' (1.829 m), weight 89.4 kg, SpO2 98 %.    Vent Mode: PRVC FiO2 (%):  [30 %] 30 % Set Rate:  [20 bmp] 20 bmp Vt Set:  [620 mL] 620 mL PEEP:  [5 cmH20] 5 cmH20 Plateau Pressure:  [14 cmH20-21 cmH20] 20 cmH20   Intake/Output Summary (Last 24 hours) at 01/13/2019 0801 Last data filed at 01/13/2019 0600 Gross per 24 hour  Intake 6005.74 ml  Output 1225 ml  Net 4780.74 ml   Filed Weights   01/10/19 0426 01/11/19 0400 01/12/19 0400  Weight: 91.2 kg 88.6 kg 89.4 kg   EXAM:  General: more alert, sitting in bed, on trach  HEENT: MMM, trach in place with secretions Neuro: follows commands, mouths words CV: RRR, no murmur PULM: rhonchorous BS throughout, normal WOB on 10/5 GI: soft, NTTP. +BS. g tube site clean and dry Extremities: warm, dry, no LE edema. L BKA Skin: no appreciable lesions though has large sacral ulcer  Lines/Access:  Trach R PIV G tube Urinary cath  LABS   PULMONARY Recent Labs  Lab 01/08/19 2318 01/09/19 0218  PHART 7.327* 7.494*  PCO2ART 74.0* 47.4  PO2ART 44.3* 52.0*  HCO3 37.4* 36.1*  O2SAT 71.2 88.0   CBC Recent Labs  Lab 01/09/19 0300 01/11/19 0241 01/12/19 0324  HGB 10.0* 9.0* 8.4*  HCT 33.0* 29.8* 27.5*  WBC 8.9 9.3 10.1  PLT 342 318 266   COAGULATION No results for input(s): INR in the last 168 hours.  CARDIAC   Recent Labs  Lab 01/09/19 0300 01/09/19 0919 01/09/19 1450  TROPONINI 0.04* <0.03 0.03*   No results for input(s): PROBNP in the last 168 hours.  CHEMISTRY Recent Labs  Lab 01/09/19 0300 01/11/19 0241  01/12/19 0324  NA 150* 145 144  K 4.6 4.7 3.9  CL 108 107 107  CO2 34* 33* 29  GLUCOSE 128* 182* 135*  BUN 60* 78* 88*  CREATININE 1.16 1.14 1.29*  CALCIUM 9.4 8.8* 8.5*  MG 2.4 2.4  --    Estimated Creatinine Clearance: 66.8 mL/min (A) (by C-G formula based on SCr of 1.29 mg/dL (H)).  LIVER Recent Labs  Lab 01/09/19 0300 01/11/19 0241  AST 17 29  ALT 30 24  ALKPHOS 75 69  BILITOT 0.2* 0.9  PROT 7.6 7.4  ALBUMIN 2.1* 2.0*   INFECTIOUS Recent Labs  Lab 01/09/19 0300 01/09/19 0620  LATICACIDVEN 1.2 1.3   ENDOCRINE CBG (last 3)  Recent Labs    01/12/19 1950 01/12/19 2335 01/13/19 0359  GLUCAP 122* 87 88   IMAGING  Dg Chest Port 1 View  Result Date: 01/12/2019 CLINICAL DATA:  63 year old male with influenza, respiratory failure, cardiogenic shock. EXAM: PORTABLE CHEST 1 VIEW COMPARISON:  01/11/2019 and earlier. FINDINGS: Portable AP semi upright view at 0243 hours. Stable tracheostomy tube. Stable cardiac size and mediastinal contours. No pneumothorax or pulmonary edema. Similar lung volumes, but increased dense retrocardiac opacity greater on the right. No definite pleural effusion. Paucity of bowel gas in the upper abdomen. IMPRESSION: 1. Bilateral lower lobe collapse or consolidation appears increased and greater on the right. 2. Stable tracheostomy tube. Electronically Signed   By: Genevie Ann M.D.   On: 01/12/2019 03:56   Dg Chest Port 1 View  Result Date: 01/11/2019 CLINICAL DATA:  Acute respiratory distress. EXAM: PORTABLE CHEST 1 VIEW COMPARISON:  Radiographs 01/10/2019 and 01/09/2019. FINDINGS: 1257 hours. Tracheostomy appears stable. The heart size and mediastinal contours are stable. There are stable right greater than left patchy airspace opacities. No edema, significant pleural effusion or pneumothorax. IMPRESSION: No significant change. Persistent right-greater-than-left basilar airspace opacities suspicious for aspiration. Electronically Signed   By: Richardean Sale M.D.   On: 01/11/2019 13:54   Resolved Hospital Problem list   AKI Pneumonia Hypokalemia Hypernatremia  Assessment & Plan:   RESP Acute Hypoxemic Respiratory Failure s/p Tracheostomy In setting of Flu A, Corynebacterium PNA, now off abx. Multiple failed attempts at [facility], most recent 2/16 despite upsizing trach to #5XLT, gap appreciated on exam with secretions. - Will attempt to wean slowly prior to attempting ATC again. Currently 10/5.  - Trach care per protocol  - Wean O2 for sats 88-95% - Continue Guaifenesin, free water  - Continue chest PT  - Duoneb  - PMV efforts as able   CARDIAC Hx HTN, HLD, CAD BP low normal. Coreg held last night. Hypotensive again overnight, received 1L NS bolus. - Continue ASA, lipitor, coreg 3.170m BID - hold losartan Cardiomyopathy evidenced via 1/4 ECHO, resolved.  ENDOCRINE DM - H/o poorly controlled, hx amputation. CBGs 80-100s. -  SSI, resistant scale  - Levemir 50 units BID   NEURO Agitated Delirium  - Minimize sedating medications as able  - Continue seroquel BID - Klonopin BID   HEME Anemia - Hb 8.4. No overt signs of bleeding. - Trend CBC  - Transfuse per ICU guidelines  - Lovenox for DVT prophylaxis   DERM Sacral Decubitus Ulcer WOC following, receiving hydrotherapy   FEN/GI Moderate Protein Calorie Malnutrition  - Continue TF per Nutrition  - Nutritional supplement  - MVI, folic acid - PEG care per protocol  - colace, dulcolax prn  CC Time:     Rory Percy, DO PGY-2, [place] 01/13/2019 8:01 AM

## 2019-01-14 LAB — GLUCOSE, CAPILLARY
GLUCOSE-CAPILLARY: 224 mg/dL — AB (ref 70–99)
GLUCOSE-CAPILLARY: 173 mg/dL — AB (ref 70–99)
Glucose-Capillary: 136 mg/dL — ABNORMAL HIGH (ref 70–99)
Glucose-Capillary: 208 mg/dL — ABNORMAL HIGH (ref 70–99)
Glucose-Capillary: 259 mg/dL — ABNORMAL HIGH (ref 70–99)
Glucose-Capillary: 279 mg/dL — ABNORMAL HIGH (ref 70–99)
Glucose-Capillary: 301 mg/dL — ABNORMAL HIGH (ref 70–99)

## 2019-01-14 LAB — CBC
HCT: 26 % — ABNORMAL LOW (ref 39.0–52.0)
Hemoglobin: 7.9 g/dL — ABNORMAL LOW (ref 13.0–17.0)
MCH: 26.6 pg (ref 26.0–34.0)
MCHC: 30.4 g/dL (ref 30.0–36.0)
MCV: 87.5 fL (ref 80.0–100.0)
Platelets: 266 10*3/uL (ref 150–400)
RBC: 2.97 MIL/uL — AB (ref 4.22–5.81)
RDW: 15.2 % (ref 11.5–15.5)
WBC: 7 10*3/uL (ref 4.0–10.5)
nRBC: 0 % (ref 0.0–0.2)

## 2019-01-14 MED ORDER — COLISTIMETHATE NEBULIZED SOLUTION
150.0000 mg | Freq: Three times a day (TID) | INTRAMUSCULAR | Status: DC
  Administered 2019-01-14 – 2019-01-19 (×15): 150 mg via RESPIRATORY_TRACT
  Filled 2019-01-14 (×21): qty 2

## 2019-01-14 MED ORDER — ACETYLCYSTEINE 10% NICU INHALATION SOLUTION: 2.0000 mL | Freq: Two times a day (BID) | RESPIRATORY_TRACT | Status: DC

## 2019-01-14 MED ORDER — INSULIN DETEMIR 100 UNIT/ML ~~LOC~~ SOLN
50.0000 [IU] | Freq: Two times a day (BID) | SUBCUTANEOUS | Status: DC
  Administered 2019-01-14 – 2019-01-15 (×4): 50 [IU] via SUBCUTANEOUS
  Filled 2019-01-14 (×5): qty 0.5

## 2019-01-14 MED ORDER — ACETYLCYSTEINE 20 % IN SOLN
3.0000 mL | Freq: Two times a day (BID) | RESPIRATORY_TRACT | Status: DC
  Administered 2019-01-14 – 2019-01-16 (×4): 3 mL via RESPIRATORY_TRACT
  Filled 2019-01-14 (×4): qty 4

## 2019-01-14 MED ORDER — ATROPINE SULFATE 1 MG/10ML IJ SOSY
PREFILLED_SYRINGE | INTRAMUSCULAR | Status: AC
  Filled 2019-01-14: qty 10

## 2019-01-14 MED ORDER — SODIUM CHLORIDE 0.9 % IV SOLN
1.0000 g | Freq: Three times a day (TID) | INTRAVENOUS | Status: DC
  Administered 2019-01-14 – 2019-01-15 (×3): 1 g via INTRAVENOUS
  Filled 2019-01-14 (×5): qty 1

## 2019-01-14 NOTE — Progress Notes (Signed)
Nutrition Follow-up  DOCUMENTATION CODES:   Not applicable  INTERVENTION:    Continue Vital AF 1.2 at 75 ml/h via PEG to meet nutrition needs.  Provides 2160 kcal, 135 gm protein, 1460 ml free water daily.  Continue Juven BID and MVI daily.  NUTRITION DIAGNOSIS:   Inadequate oral intake related to inability to eat as evidenced by NPO status.  Ongoing   GOAL:   Patient will meet greater than or equal to 90% of their needs  Met with TF  MONITOR:   PO intake, TF tolerance, Labs, Skin, Weight trends, I & O's  ASSESSMENT:   63 y/o M who presented to [facility] 1/3 with fever, cough, chest pain & SOB. Pt found to be Influenza A positive. Developed progressive respiratory distress requiring intubation in the ER.    Trach placed 1/16. Inhaled steroids starting today due to pseudomonas in respiratory secretions.   Patient is currently on ventilator support. He was on trach collar for a little while this morning.  MV: 14.2 L/min Temp (24hrs), Avg:98 F (36.7 C), Min:97.6 F (36.4 C), Max:98.3 F (36.8 C)   Labs reviewed. CBG's: [phone] Medications reviewed and include 200 ml free water every 4 hours, Novolog, Levemir, MVI, Juven, Solumedrol, thiamine, folic acid.   Patient is currently receiving Vital AF 1.2 via PEG at 75 ml/h (1800 ml/day) to provide 2160 kcals, 135 gm protein, 1460 ml free water daily. Tolerating without difficulty.  Diet Order:   Diet Order            Diet NPO time specified  Diet effective now              EDUCATION NEEDS:   Not appropriate for education at this time  Skin:  Skin Assessment: Skin Integrity Issues: Skin Integrity Issues:: Stage I, Unstageable Stage I: L head Stage II: N/A Unstageable: sacrum  Last BM:  2/19  Height:   Ht Readings from Last 1 Encounters:  11/29/18 6' (1.829 m)    Weight:   Wt Readings from Last 1 Encounters:  01/12/19 89.4 kg    Ideal Body Weight:  76.1 kg(adjusted for L BKA)  BMI:   Body mass index is 26.73 kg/m.  Estimated Nutritional Needs:   Kcal:  2130  Protein:  120-150 grams  Fluid:  > 2 L/day    Molli Barrows, RD, LDN, [place] Pager [phone] After Hours Pager [phone]

## 2019-01-14 NOTE — Progress Notes (Signed)
NAME:  Antonio Zou., MRN:  [id number], DOB:  June 19, 1956, LOS: 45 ADMISSION DATE:  11/28/2018, CONSULTATION DATE:  1/3 REFERRING MD:  EDP, CHIEF COMPLAINT:  Dyspnea   Brief History   63 y/o M admitted 1/3 with influenza A causing respiratory failure and cardiogenic shock. Prolonged mechanical ventilation, tracheostomy placed on January 16.  Developed HCAP.  Difficult wean, has failed multiple attempts at downsize with mucus plugging.   Past Medical History  DM2, CAD, HTN, HLD, tobacco use  Significant Hospital Events   1/03  Admit, Influenza A positive 1/06  Self extubated  1/07  Re-intubated  1/08  Self-extubated  1/09  Reintubated, mucus plugging on FOB 1/10  Fever to 103, abx restarted  1/16  Diuresing, increased WOB with weaning efforts.  Trach.  1/20  Failed wean, anxiety / secretions 1/21  Trach revision to XLT distal.  FOB  1/22  Thick secretions barrier to wean  1/24  Failed SBT, fever 1/25  Episode of mucus plugging after chest PT/wound care  2/03  Weaned 15 minutes  2/11  ATC  2/14  Trach changed to #4, mucus plugging >back to #5 XLT 2/15  Failed ATC with increased WOB 2/16  Failed ATC with increased WOB   Consults:  PCCM Cardiology   Procedures:  ETT 1/3 >>1/6, 1/7 > 1/8> 1/9> 1/16 Trach 1/16 >>  PEG 1/28 >>     Significant Diagnostic Tests:  UDS 1/4 >negative  ECHO 1/4 >LVEF 20-25%, akinesis of the anteroseptal, anterolateral & apical myocardium, grade 1 diastolic dysfunction, small pericardial effusion without evidence of hemodynamic compromise. CT Chest / ABD / Pelvis 1/3 > airway thickening, motion artifact, CAD Echo 1/29> LVEF 60-65%, left ventricular hypertrophy, trivial pericardial effusion  Micro Data:  RVP 1/3 > positive for influenza A BCx2 1/3 >No growth UC 1/4 >No Growth Sputum Cx 1/7 > Normal respiratory flora BAL >corynebacterium  Blood Cx 1/10 > neg Sputum 1/21 > abundant corynebacterium striatum Sputum 2/18 >    Antimicrobials:  Vanco 1/3 x1;    Zosyn 1/30 x1  Tamiflu 1/3 >1/8 Vancomycin 1/10 > 1/12 Ceftaz 1/10 > 1/12; 1/21 >1/22 Emycin 1/12 > 1/22 Cefepime 1/23 > 1/24 Vanc 1/21> 1/28  Interim history/subjective:  Continues to be more alert. C/o pain in RLQ, bruise visualized on exam.  Objective   Blood pressure 129/72, pulse 80, temperature 98.2 F (36.8 C), temperature source Oral, resp. rate (!) 22, height 6' (1.829 m), weight 89.4 kg, SpO2 99 %.    Vent Mode: PRVC FiO2 (%):  [30 %] 30 % Set Rate:  [20 bmp] 20 bmp Vt Set:  [620 mL] 620 mL PEEP:  [5 cmH20] 5 cmH20 Pressure Support:  [10 cmH20] 10 cmH20 Plateau Pressure:  [17 cmH20-20 cmH20] 18 cmH20   Intake/Output Summary (Last 24 hours) at 01/14/2019 0717 Last data filed at 01/14/2019 0500 Gross per 24 hour  Intake 2925 ml  Output 1250 ml  Net 1675 ml   Filed Weights   01/10/19 0426 01/11/19 0400 01/12/19 0400  Weight: 91.2 kg 88.6 kg 89.4 kg   EXAM:  General: more alert, follows commands. HEENT: trach in place, MMM, secretions noted from trach Neuro: alert, oriented CV: RRR, no murmur PULM: CTAB, no wheeze/rales GI: soft, TTP in RLQ Extremities: warm and dry, L BKA Skin: bruise visible in RLQ, no other lesions appreciated. Known sacral ulcer  Lines/Access:  Trach R PIV G tube Urinary cath  LABS   PULMONARY Recent Labs  Lab 01/08/19  2318 01/09/19 0218  PHART 7.327* 7.494*  PCO2ART 74.0* 47.4  PO2ART 44.3* 52.0*  HCO3 37.4* 36.1*  O2SAT 71.2 88.0   CBC Recent Labs  Lab 01/11/19 0241 01/12/19 0324 01/13/19 0837  HGB 9.0* 8.4* 7.9*  HCT 29.8* 27.5* 27.1*  WBC 9.3 10.1 7.2  PLT 318 266 244   COAGULATION No results for input(s): INR in the last 168 hours.  CARDIAC   Recent Labs  Lab 01/09/19 0300 01/09/19 0919 01/09/19 1450  TROPONINI 0.04* <0.03 0.03*   No results for input(s): PROBNP in the last 168 hours.  CHEMISTRY Recent Labs  Lab 01/09/19 0300 01/11/19 0241 01/12/19 0324  01/13/19 0837  NA 150* 145 144 143  K 4.6 4.7 3.9 3.8  CL 108 107 107 105  CO2 34* 33* 29 29  GLUCOSE 128* 182* 135* 109*  BUN 60* 78* 88* 85*  CREATININE 1.16 1.14 1.29* 1.05  CALCIUM 9.4 8.8* 8.5* 8.2*  MG 2.4 2.4  --   --    Estimated Creatinine Clearance: 82.1 mL/min (by C-G formula based on SCr of 1.05 mg/dL).  LIVER Recent Labs  Lab 01/09/19 0300 01/11/19 0241  AST 17 29  ALT 30 24  ALKPHOS 75 69  BILITOT 0.2* 0.9  PROT 7.6 7.4  ALBUMIN 2.1* 2.0*   INFECTIOUS Recent Labs  Lab 01/09/19 0300 01/09/19 0620  LATICACIDVEN 1.2 1.3   ENDOCRINE CBG (last 3)  Recent Labs    01/13/19 1939 01/14/19 0004 01/14/19 0401  GLUCAP 201* 279* 301*   IMAGING  No results found. Resolved Hospital Problem list   AKI Pneumonia Hypokalemia Hypernatremia  Assessment & Plan:   RESP Acute Hypoxemic Respiratory Failure s/p Tracheostomy In setting of Flu A, Corynebacterium PNA, now off abx but with continued copious secretions. Multiple failed attempts at [facility], most recent 2/16 despite upsizing trach to #5XLT. Resp cx obtained yesterday. - Will attempt to wean slowly prior to attempting ATC again. Currently PRVC.  - f/u resp cx - Trach care per protocol  - Wean O2 for sats 88-95% - Continue Guaifenesin, free water  - Continue chest PT  - Duoneb  - PMV efforts as able   CARDIAC Hx HTN, HLD, CAD BP stable overnight with holding losartan and coreg.   - Continue ASA, lipitor - hold losartan, coreg Cardiomyopathy evidenced via 1/4 ECHO, resolved.  ENDOCRINE DM - H/o poorly controlled, hx amputation. CBGs increased yesterday, likely 2/2 initiation of steroids. - SSI, resistant scale  - increase Levemir back to 50 units BID   NEURO Agitated Delirium, resolved  - Minimize sedating medications as able  - Continue seroquel BID - Klonopin BID   HEME Anemia - Hb 7.9. No overt signs of bleeding. - Trend CBC  - Transfuse per ICU guidelines  - Lovenox for DVT prophylaxis    DERM Sacral Decubitus Ulcer WOC following, receiving hydrotherapy   FEN/GI Moderate Protein Calorie Malnutrition  - Continue TF per Nutrition  - Nutritional supplement  - MVI, folic acid - PEG care per protocol  - colace, dulcolax prn  CC Time:     Rory Percy, DO PGY-2, [place] 01/14/2019 7:17 AM

## 2019-01-14 NOTE — Progress Notes (Signed)
Physical Therapy Wound Treatment Patient Details  Name: Antonio [name]. MRN: [id number] Date of Birth: Feb 20, 1956  Today's Date: 01/14/2019 Time: 9357-0177 Time Calculation (min): 35 min  Subjective  Subjective: Pt asking how is it looking Patient and Family Stated Goals: Pt on trach Date of Onset: (unknown) Prior Treatments: dressing changes  Pain Score:  Grimacing/flinching with sharp debridement; limited within pt tolerance  Wound Assessment  Pressure Injury 12/01/18 Unstageable - Full thickness tissue loss in which the base of the ulcer is covered by slough (yellow, tan, gray, green or brown) and/or eschar (tan, brown or black) in the wound bed. sacrum (Active)  Wound Image   01/14/2019 10:25 AM  Dressing Type Barrier Film (skin prep);ABD;Moist to moist;Gauze (Comment) 01/14/2019 10:25 AM  Dressing Clean;Dry;Intact;Changed 01/14/2019 10:25 AM  Dressing Change Frequency Daily 01/14/2019 10:25 AM  State of Healing Early/partial granulation 01/14/2019 10:25 AM  Site / Wound Assessment Red;Yellow;Pink 01/14/2019 10:25 AM  % Wound base Red or Granulating 70% 01/14/2019 10:25 AM  % Wound base Yellow/Fibrinous Exudate 30% 01/14/2019 10:25 AM  % Wound base Black/Eschar 0% 01/14/2019 10:25 AM  % Wound base Other/Granulation Tissue (Comment) 0% 01/14/2019 10:25 AM  Peri-wound Assessment Erythema (blanchable) 01/14/2019 10:25 AM  Wound Length (cm) 9 cm 01/14/2019 10:25 AM  Wound Width (cm) 7.5 cm 01/14/2019 10:25 AM  Wound Depth (cm) 0.1 cm 01/14/2019 10:25 AM  Wound Surface Area (cm^2) 67.5 cm^2 01/14/2019 10:25 AM  Wound Volume (cm^3) 6.75 cm^3 01/14/2019 10:25 AM  Tunneling (cm) 0 12/31/2018 10:28 AM  Margins Unattached edges (unapproximated) 01/14/2019 10:25 AM  Drainage Amount Moderate 01/14/2019 10:25 AM  Drainage Description Purulent;Green 01/14/2019 10:25 AM  Treatment Debridement (Selective);Hydrotherapy (Pulse lavage) 01/14/2019 10:25 AM      Hydrotherapy Pulsed lavage therapy - wound  location: sacrum Pulsed Lavage with Suction (psi): 8 psi Pulsed Lavage with Suction - Normal Saline Used: 1000 mL Pulsed Lavage Tip: Tip with splash shield Selective Debridement Selective Debridement - Location: sacrum Selective Debridement - Tools Used: Forceps;Scissors Selective Debridement - Tissue Removed: yellow necrotic tissue   Wound Assessment and Plan  Wound Therapy - Assess/Plan/Recommendations Wound Therapy - Clinical Statement: Minimal removal of yellow necrotic slough at superior portion. Continuing with moderate green drainage Wound Therapy - Functional Problem List: decr mobility Factors Delaying/Impairing Wound Healing: Diabetes Mellitus;Immobility;Multiple medical problems Hydrotherapy Plan: Debridement;Dressing change;Patient/family education;Pulsatile lavage with suction Wound Therapy - Frequency: 3X / week Wound Therapy - Follow Up Recommendations: Skilled nursing facility Wound Plan: see above  Wound Therapy Goals- Improve the function of patient's integumentary system by progressing the wound(s) through the phases of wound healing (inflammation - proliferation - remodeling) by: Decrease Necrotic Tissue to: 20 Decrease Necrotic Tissue - Progress: Progressing toward goal Increase Granulation Tissue to: 80 Increase Granulation Tissue - Progress: Progressing toward goal Goals/treatment plan/discharge plan were made with and agreed upon by patient/family: Yes Time For Goal Achievement: 7 days Wound Therapy - Potential for Goals: Fair  Goals will be updated until maximal potential achieved or discharge criteria met.  Discharge criteria: when goals achieved, discharge from hospital, MD decision/surgical intervention, no progress towards goals, refusal/missing three consecutive treatments without notification or medical reason.  GP  [name], PT, DPT Acute Rehabilitation Services Pager [phone] Office [phone]      [name] 01/14/2019, 10:30  AM

## 2019-01-14 NOTE — Progress Notes (Signed)
Pharmacy Antibiotic Note  Antonio [name]. is a 63 y.o. male admitted on 11/28/2018 with pneumonia.  Pharmacy has been consulted for meropenem dosing. Of note patient as copious secretions and sputum culture growing moderate pseudomonas.   Plan: -Start meropenem 1 gm IV Q 8 hours -Monitor for clinical improvement   Height: 6' (182.9 cm) Weight: 197 lb 1.5 oz (89.4 kg) IBW/kg (Calculated) : 77.6  Temp (24hrs), Avg:98.1 F (36.7 C), Min:97.6 F (36.4 C), Max:98.9 F (37.2 C)  Recent Labs  Lab 01/09/19 0300 01/09/19 0620 01/11/19 0241 01/12/19 0324 01/13/19 0837 01/14/19 0747  WBC 8.9  --  9.3 10.1 7.2 7.0  CREATININE 1.16  --  1.14 1.29* 1.05  --   LATICACIDVEN 1.2 1.3  --   --   --   --     Estimated Creatinine Clearance: 82.1 mL/min (by C-G formula based on SCr of 1.05 mg/dL).    Allergies  Allergen Reactions  . Iodinated Diagnostic Agents Hives, Rash and Other (See Comments)    Blisters Staph Blisters / staph  . Penicillins Other (See Comments)    UNSPECIFIED REACTION  Has patient had a PCN reaction causing immediate rash, facial/tongue/throat swelling, SOB or lightheadedness with hypotension: No Has patient had a PCN reaction causing severe rash involving mucus membranes or skin necrosis: No Has patient had a PCN reaction that required hospitalization: No Has patient had a PCN reaction occurring within the last 10 years: No If all of the above answers are "NO", then may proceed with Cephalosporin use.     Thank you for allowing pharmacy to be a part of this patient's care.  Vinnie Level, PharmD., BCPS Clinical Pharmacist Clinical phone for 01/14/19 until 3:30pm: [phone] If after 3:30pm, please refer to [facility] for unit-specific pharmacist

## 2019-01-14 NOTE — Progress Notes (Signed)
Patient's HR went into the 20s and thought to be mucous plugging. Suctioned, atropine at bedside, RT called. RT placed back on the vent, CCM notified. Huntley Estelle E, RN 01/14/2019 11:34 AM

## 2019-01-15 ENCOUNTER — Inpatient Hospital Stay (HOSPITAL_COMMUNITY): Payer: Medicaid Other

## 2019-01-15 DIAGNOSIS — R079 Chest pain, unspecified: Secondary | ICD-10-CM | POA: Diagnosis not present

## 2019-01-15 DIAGNOSIS — J9621 Acute and chronic respiratory failure with hypoxia: Secondary | ICD-10-CM | POA: Diagnosis not present

## 2019-01-15 DIAGNOSIS — J9601 Acute respiratory failure with hypoxia: Secondary | ICD-10-CM | POA: Diagnosis not present

## 2019-01-15 LAB — GLUCOSE, CAPILLARY
GLUCOSE-CAPILLARY: 275 mg/dL — AB (ref 70–99)
Glucose-Capillary: 113 mg/dL — ABNORMAL HIGH (ref 70–99)
Glucose-Capillary: 123 mg/dL — ABNORMAL HIGH (ref 70–99)
Glucose-Capillary: 136 mg/dL — ABNORMAL HIGH (ref 70–99)
Glucose-Capillary: 228 mg/dL — ABNORMAL HIGH (ref 70–99)
Glucose-Capillary: 232 mg/dL — ABNORMAL HIGH (ref 70–99)

## 2019-01-15 LAB — CULTURE, RESPIRATORY W GRAM STAIN

## 2019-01-15 MED ORDER — SODIUM CHLORIDE 0.9 % IV SOLN
2.0000 g | Freq: Three times a day (TID) | INTRAVENOUS | Status: AC
  Administered 2019-01-15 – 2019-01-19 (×12): 2 g via INTRAVENOUS
  Filled 2019-01-15 (×12): qty 2

## 2019-01-15 NOTE — Progress Notes (Signed)
OT Cancellation Note  Patient Details Name: Antonio [name]. MRN: [id number] DOB: 1956/02/08   Cancelled Treatment:    Reason Eval/Treat Not Completed: Fatigue/lethargy limiting ability to participate.  Pt sleeping soundly despite multiple attempts to awaken him.  He did open eyes briefly, pulled his hands away, and shook head definitively "no".  Will reattempt.  Jeani Hawking, OTR/L Acute Rehabilitation Services Pager [phone] Office [phone]   Jeani Hawking M 01/15/2019, 12:37 PM

## 2019-01-15 NOTE — Care Management Note (Addendum)
Case Management Note Previous Note Created by Sidney Ace  Patient Details  Name: Antonio [name]. MRN: [id number] Date of Birth: 02-19-1956  Subjective/Objective:  63 yo admitted with flu A and PNA with acute respiratory failure intubated 1/4, trach 1/16 with course complicated by acute MI, CHF and cardiogenic shock.  PTA, pt resided at home with parents.                  Action/Plan: Pt not weaning to TC yet, and will likely need vent SNF.  CSW has spoken with family about this, and they are in agreement.  They do understand that placement will likely be in [place], but are hopeful that pt will improve and wean to [facility] while waiting on [place] [facility].  Will follow progress.  Expected Discharge Date:                  Expected Discharge Plan:  Skilled Nursing Facility  In-House Referral:  Clinical Social Work  Discharge planning Services  CM Consult  Post Acute Care Choice:    Choice offered to:     DME Arranged:    DME Agency:     HH Arranged:    HH Agency:     Status of Service:  In process, will continue to follow  If discussed at Long Length of Stay Meetings, dates discussed:    Additional Comments:  01/15/2019 Pt unable to tolerate TC - remains on vent with copious secretions..  Pts blood cultures are positive .  Pt currently receiving hydrotherapy.  Discharge disposition continues to be vent SNF once appropriate.

## 2019-01-15 NOTE — Progress Notes (Signed)
NAME:  Antonio Guillotte., MRN:  [id number], DOB:  1955/12/11, LOS: 70 ADMISSION DATE:  11/28/2018, CONSULTATION DATE:  1/3 REFERRING MD:  EDP, CHIEF COMPLAINT:  Dyspnea   Brief History   63 y/o M admitted 1/3 with influenza A causing respiratory failure and cardiogenic shock. Prolonged mechanical ventilation, tracheostomy placed on January 16.  Developed HCAP.  Difficult wean, has failed multiple attempts at downsize with mucus plugging. Repeat resp cx 2/18 Pseduomonas.  Past Medical History  DM2, CAD, HTN, HLD, tobacco use  Significant Hospital Events   1/03  Admit, Influenza A positive 1/06  Self extubated  1/07  Re-intubated  1/08  Self-extubated  1/09  Reintubated, mucus plugging on FOB 1/10  Fever to 103, abx restarted  1/16  Diuresing, increased WOB with weaning efforts.  Trach.  1/20  Failed wean, anxiety / secretions 1/21  Trach revision to XLT distal.  FOB  1/22  Thick secretions barrier to wean  1/24  Failed SBT, fever 1/25  Episode of mucus plugging after chest PT/wound care  2/03  Weaned 15 minutes  2/11  ATC  2/14  Trach changed to #4, mucus plugging >back to #5 XLT 2/15  Failed ATC with increased WOB 2/16  Failed ATC with increased WOB   Consults:  PCCM Cardiology   Procedures:  ETT 1/3 >>1/6, 1/7 > 1/8> 1/9> 1/16 Trach 1/16 >>  PEG 1/28 >>     Significant Diagnostic Tests:  UDS 1/4 >negative  ECHO 1/4 >LVEF 20-25%, akinesis of the anteroseptal, anterolateral & apical myocardium, grade 1 diastolic dysfunction, small pericardial effusion without evidence of hemodynamic compromise. CT Chest / ABD / Pelvis 1/3 > airway thickening, motion artifact, CAD Echo 1/29> LVEF 60-65%, left ventricular hypertrophy, trivial pericardial effusion  Micro Data:  RVP 1/3 > positive for influenza A BCx2 1/3 >No growth UC 1/4 >No Growth Sputum Cx 1/7 > Normal respiratory flora BAL >corynebacterium  Blood Cx 1/10 > neg Sputum 1/21 > abundant corynebacterium  striatum Sputum 2/18 > Pseudomonas  Antimicrobials:  Vanco 1/3 x1;    Zosyn 1/30 x1  Tamiflu 1/3 >1/8 Vancomycin 1/10 > 1/12 Ceftaz 1/10 > 1/12; 1/21 >1/22 Emycin 1/12 > 1/22 Cefepime 1/23 > 1/24 Vanc 1/21> 1/28 Colistin 2/19 > Meropenem 2/19 >  Interim history/subjective:  No pain this am. Continues to remain alert. No events noted overnight.  Objective   Blood pressure 120/66, pulse 63, temperature 98.3 F (36.8 C), temperature source Oral, resp. rate 20, height 6' (1.829 m), weight 89.4 kg, SpO2 100 %.    Vent Mode: PRVC FiO2 (%):  [28 %-40 %] 30 % Set Rate:  [20 bmp] 20 bmp Vt Set:  [620 mL] 620 mL PEEP:  [5 cmH20] 5 cmH20 Plateau Pressure:  [16 cmH20-19 cmH20] 18 cmH20   Intake/Output Summary (Last 24 hours) at 01/15/2019 0709 Last data filed at 01/15/2019 0600 Gross per 24 hour  Intake 3312.66 ml  Output 1623 ml  Net 1689.66 ml   Filed Weights   01/10/19 0426 01/11/19 0400 01/12/19 0400  Weight: 91.2 kg 88.6 kg 89.4 kg   EXAM:  General: pleasant male lying in bed, in NAD. HEENT:trach in place, copious secretions surrounding. Neuro: alert, follows commands CV: RRR, no murmur PULM: CTAB GI: soft, NTTP, +BS Extremities: warm, dry. L BKA Skin: no lesions appreciated  LABS   PULMONARY Recent Labs  Lab 01/08/19 2318 01/09/19 0218  PHART 7.327* 7.494*  PCO2ART 74.0* 47.4  PO2ART 44.3* 52.0*  HCO3 37.4* 36.1*  O2SAT 71.2 88.0   CBC Recent Labs  Lab 01/12/19 0324 01/13/19 0837 01/14/19 0747  HGB 8.4* 7.9* 7.9*  HCT 27.5* 27.1* 26.0*  WBC 10.1 7.2 7.0  PLT 266 244 266   COAGULATION No results for input(s): INR in the last 168 hours.  CARDIAC   Recent Labs  Lab 01/09/19 0300 01/09/19 0919 01/09/19 1450  TROPONINI 0.04* <0.03 0.03*   No results for input(s): PROBNP in the last 168 hours.  CHEMISTRY Recent Labs  Lab 01/09/19 0300 01/11/19 0241 01/12/19 0324 01/13/19 0837  NA 150* 145 144 143  K 4.6 4.7 3.9 3.8  CL 108 107 107  105  CO2 34* 33* 29 29  GLUCOSE 128* 182* 135* 109*  BUN 60* 78* 88* 85*  CREATININE 1.16 1.14 1.29* 1.05  CALCIUM 9.4 8.8* 8.5* 8.2*  MG 2.4 2.4  --   --    Estimated Creatinine Clearance: 82.1 mL/min (by C-G formula based on SCr of 1.05 mg/dL).  LIVER Recent Labs  Lab 01/09/19 0300 01/11/19 0241  AST 17 29  ALT 30 24  ALKPHOS 75 69  BILITOT 0.2* 0.9  PROT 7.6 7.4  ALBUMIN 2.1* 2.0*   INFECTIOUS Recent Labs  Lab 01/09/19 0300 01/09/19 0620  LATICACIDVEN 1.2 1.3   ENDOCRINE CBG (last 3)  Recent Labs    01/14/19 1941 01/14/19 2324 01/15/19 0336  GLUCAP 224* 173* 136*   IMAGING  Dg Chest Port 1 View  Result Date: 01/15/2019 CLINICAL DATA:  Pulmonary insufficiency. SOB. EXAM: PORTABLE CHEST 1 VIEW COMPARISON:  01/12/2019. FINDINGS: Unchanged cardiomediastinal silhouette. Mild increased perihilar markings without consolidation or edema. No bony abnormality. Unchanged tracheostomy. No acute osseous findings. IMPRESSION: Improved aeration.  Mild increased perihilar markings Electronically Signed   By: Staci Righter M.D.   On: 01/15/2019 07:06   Resolved Hospital Problem list   AKI Pneumonia Hypokalemia Hypernatremia  Assessment & Plan:   RESP Acute Hypoxemic Respiratory Failure s/p Tracheostomy In setting of Flu A, Corynebacterium PNA, now new resp cx with Pseudomonas and persistent copious secretions. Started abx yesterday. CXR this am improved from prior - Will attempt to wean slowly prior to attempting ATC again. Currently PRVC.  - continue meropenem, inhaled cilostin and mucomyst via metaneb - Trach care per protocol  - Wean O2 for sats 88-95% - Continue Guaifenesin, free water  - Continue chest PT  - Duoneb  - PMV efforts as able   CARDIAC Hx HTN, HLD, CAD BP stable overnight.    - Continue ASA, lipitor, coreg - losartan d/ced Cardiomyopathy evidenced via 1/4 ECHO, resolved.  ENDOCRINE DM - H/o poorly controlled, hx amputation. CBGs improved with  increased levemir. - SSI, resistant scale  - continue Levemir 50 units BID   NEURO Agitated Delirium, resolved  - Minimize sedating medications as able  - Continue seroquel BID - Klonopin BID   HEME Anemia - No overt signs of bleeding. - monitor CBC  - Transfuse per ICU guidelines  - Lovenox for DVT prophylaxis   DERM Sacral Decubitus Ulcer WOC following, receiving hydrotherapy   FEN/GI Moderate Protein Calorie Malnutrition  - Continue TF per Nutrition  - Nutritional supplement  - MVI, folic acid - PEG care per protocol  - colace, dulcolax prn  CC Time:     Rory Percy, DO PGY-2, [facility] 01/15/2019 7:09 AM

## 2019-01-15 NOTE — Progress Notes (Signed)
SLP Cancellation Note  Patient Details Name: Antonio [name]. MRN: [id number] DOB: Oct 30, 1956   Cancelled treatment:       Reason Eval/Treat Not Completed: Patient not medically ready. Per note, slow wean today, will f/u tomorrow for possible PMSV trials if appropriate   Stephanne Greeley, Riley Nearing 01/15/2019, 9:37 AM

## 2019-01-16 DIAGNOSIS — R079 Chest pain, unspecified: Secondary | ICD-10-CM | POA: Diagnosis not present

## 2019-01-16 DIAGNOSIS — J9621 Acute and chronic respiratory failure with hypoxia: Secondary | ICD-10-CM | POA: Diagnosis not present

## 2019-01-16 DIAGNOSIS — J810 Acute pulmonary edema: Secondary | ICD-10-CM | POA: Diagnosis not present

## 2019-01-16 DIAGNOSIS — R0603 Acute respiratory distress: Secondary | ICD-10-CM | POA: Diagnosis not present

## 2019-01-16 LAB — CBC
HCT: 28 % — ABNORMAL LOW (ref 39.0–52.0)
Hemoglobin: 8.4 g/dL — ABNORMAL LOW (ref 13.0–17.0)
MCH: 26.7 pg (ref 26.0–34.0)
MCHC: 30 g/dL (ref 30.0–36.0)
MCV: 88.9 fL (ref 80.0–100.0)
Platelets: 258 10*3/uL (ref 150–400)
RBC: 3.15 MIL/uL — ABNORMAL LOW (ref 4.22–5.81)
RDW: 15.3 % (ref 11.5–15.5)
WBC: 6.6 10*3/uL (ref 4.0–10.5)
nRBC: 0 % (ref 0.0–0.2)

## 2019-01-16 LAB — BASIC METABOLIC PANEL
Anion gap: 8 (ref 5–15)
BUN: 44 mg/dL — ABNORMAL HIGH (ref 6–20)
CHLORIDE: 104 mmol/L (ref 98–111)
CO2: 26 mmol/L (ref 22–32)
Calcium: 8.7 mg/dL — ABNORMAL LOW (ref 8.9–10.3)
Creatinine, Ser: 0.69 mg/dL (ref 0.61–1.24)
GFR calc Af Amer: >60 mL/min (ref >60)
GFR calc non Af Amer: >60 mL/min (ref >60)
Glucose, Bld: 187 mg/dL — ABNORMAL HIGH (ref 70–99)
Potassium: 4.5 mmol/L (ref 3.5–5.1)
Sodium: 138 mmol/L (ref 135–145)

## 2019-01-16 LAB — GLUCOSE, CAPILLARY
Glucose-Capillary: 175 mg/dL — ABNORMAL HIGH (ref 70–99)
Glucose-Capillary: 94 mg/dL (ref 70–99)
Glucose-Capillary: 92 mg/dL (ref 70–99)
Glucose-Capillary: 86 mg/dL (ref 70–99)
Glucose-Capillary: 91 mg/dL (ref 70–99)

## 2019-01-16 MED ORDER — INSULIN DETEMIR 100 UNIT/ML ~~LOC~~ SOLN
35.0000 [IU] | Freq: Two times a day (BID) | SUBCUTANEOUS | Status: DC
  Administered 2019-01-16 – 2019-01-30 (×30): 35 [IU] via SUBCUTANEOUS
  Filled 2019-01-16 (×31): qty 0.35

## 2019-01-16 MED ORDER — FREE WATER
200.0000 mL | Freq: Four times a day (QID) | Status: DC
  Administered 2019-01-16 – 2019-01-19 (×12): 200 mL

## 2019-01-16 MED ORDER — COLLAGENASE 250 UNIT/GM EX OINT
TOPICAL_OINTMENT | Freq: Every day | CUTANEOUS | Status: DC
  Administered 2019-01-17 – 2019-02-06 (×18): via TOPICAL
  Administered 2019-02-07: 1 via TOPICAL
  Administered 2019-02-08 – 2019-02-11 (×4): via TOPICAL
  Filled 2019-01-16 (×5): qty 30

## 2019-01-16 NOTE — Progress Notes (Signed)
NAME:  Chaske Paskett., MRN:  [id number], DOB:  [birth year], LOS: 58 ADMISSION DATE:  11/28/2018, CONSULTATION DATE:  1/3 REFERRING MD:  EDP, CHIEF COMPLAINT:  Dyspnea   Brief History   63 y/o M admitted 1/3 with influenza A causing respiratory failure and cardiogenic shock. Prolonged mechanical ventilation, tracheostomy placed on January 16.  Developed HCAP.  Difficult wean, has failed multiple attempts at downsize with mucus plugging. Repeat resp cx 2/18 Pseduomonas.  Past Medical History  DM2, CAD, HTN, HLD, tobacco use  Significant Hospital Events   1/03  Admit, Influenza A positive 1/06  Self extubated  1/07  Re-intubated  1/08  Self-extubated  1/09  Reintubated, mucus plugging on FOB 1/10  Fever to 103, abx restarted  1/16  Diuresing, increased WOB with weaning efforts.  Trach.  1/20  Failed wean, anxiety / secretions 1/21  Trach revision to XLT distal.  FOB  1/22  Thick secretions barrier to wean  1/24  Failed SBT, fever 1/25  Episode of mucus plugging after chest PT/wound care  2/03  Weaned 15 minutes  2/11  ATC  2/14  Trach changed to #4, mucus plugging >back to #5 XLT 2/15  Failed ATC with increased WOB 2/16  Failed ATC with increased WOB   Consults:  PCCM Cardiology   Procedures:  ETT 1/3 >>1/6, 1/7 > 1/8> 1/9> 1/16 Trach 1/16 >>  PEG 1/28 >>     Significant Diagnostic Tests:  UDS 1/4 >negative  ECHO 1/4 >LVEF 20-25%, akinesis of the anteroseptal, anterolateral & apical myocardium, grade 1 diastolic dysfunction, small pericardial effusion without evidence of hemodynamic compromise. CT Chest / ABD / Pelvis 1/3 > airway thickening, motion artifact, CAD Echo 1/29> LVEF 60-65%, left ventricular hypertrophy, trivial pericardial effusion  Micro Data:  RVP 1/3 > positive for influenza A BCx2 1/3 >No growth UC 1/4 >No Growth Sputum Cx 1/7 > Normal respiratory flora BAL >corynebacterium  Blood Cx 1/10 > neg Sputum 1/21 > abundant corynebacterium  striatum Sputum 2/18 > Pseudomonas  Antimicrobials:  Vanco 1/3 x1;    Zosyn 1/30 x1  Tamiflu 1/3 >1/8 Vancomycin 1/10 > 1/12 Ceftaz 1/10 > 1/12; 1/21 >1/22 Emycin 1/12 > 1/22 Cefepime 1/23 > 1/24 Vanc 1/21> 1/28 Colistin 2/19 > Meropenem 2/19 > 2/20 Ceftaz 2/20 >  Interim history/subjective:  No acute events overnight, BP stable.  Objective   Blood pressure (!) 138/93, pulse 68, temperature 97.9 F (36.6 C), temperature source Oral, resp. rate (!) 22, height 6' (1.829 m), weight 91.1 kg, SpO2 99 %.    Vent Mode: PRVC FiO2 (%):  [30 %] 30 % Set Rate:  [20 bmp] 20 bmp Vt Set:  [620 mL-650 mL] 620 mL PEEP:  [5 cmH20] 5 cmH20 Plateau Pressure:  [16 cmH20-24 cmH20] 17 cmH20   Intake/Output Summary (Last 24 hours) at 01/16/2019 8466 Last data filed at 01/16/2019 0600 Gross per 24 hour  Intake 2756.46 ml  Output 2200 ml  Net 556.46 ml   Filed Weights   01/11/19 0400 01/12/19 0400 01/16/19 0500  Weight: 88.6 kg 89.4 kg 91.1 kg   EXAM:  General: pleasant male, in NAD HEENT: trach in place with copious secretions Neuro: alert, follows commands, mouths words CV: RRR, no murmur PULM: distant bibasilar breath sounds GI: soft, TTP diffusely. PEG tube intact with crusting Extremities: warm, dry Skin: no appreciable lesions  LABS   PULMONARY No results for input(s): PHART, PCO2ART, PO2ART, HCO3, TCO2, O2SAT in the last 168 hours.  Invalid input(s):  PCO2, PO2 CBC Recent Labs  Lab 01/13/19 0837 01/14/19 0747 01/16/19 0338  HGB 7.9* 7.9* 8.4*  HCT 27.1* 26.0* 28.0*  WBC 7.2 7.0 6.6  PLT 244 266 258   COAGULATION No results for input(s): INR in the last 168 hours.  CARDIAC   Recent Labs  Lab 01/09/19 0919 01/09/19 1450  TROPONINI <0.03 0.03*   No results for input(s): PROBNP in the last 168 hours.  CHEMISTRY Recent Labs  Lab 01/11/19 0241 01/12/19 0324 01/13/19 0837 01/16/19 0338  NA 145 144 143 138  K 4.7 3.9 3.8 4.5  CL 107 107 105 104  CO2 33*  _0 GLUCOSE 182* 135* 109* 187*  BUN 78* 88* 85* 44*  CREATININE 1.14 1.29* 1.05 0.69  CALCIUM 8.8* 8.5* 8.2* 8.7*  MG 2.4  --   --   --    Estimated Creatinine Clearance: 107.8 mL/min (by C-G formula based on SCr of 0.69 mg/dL).  LIVER Recent Labs  Lab 01/11/19 0241  AST 29  ALT 24  ALKPHOS 69  BILITOT 0.9  PROT 7.4  ALBUMIN 2.0*   INFECTIOUS No results for input(s): LATICACIDVEN, PROCALCITON in the last 168 hours. ENDOCRINE CBG (last 3)  Recent Labs    01/15/19 2109 01/15/19 2351 01/16/19 0340  GLUCAP 275* 232* 175*   IMAGING  Dg Chest Port 1 View  Result Date: 01/15/2019 CLINICAL DATA:  Pulmonary insufficiency. SOB. EXAM: PORTABLE CHEST 1 VIEW COMPARISON:  01/12/2019. FINDINGS: Unchanged cardiomediastinal silhouette. Mild increased perihilar markings without consolidation or edema. No bony abnormality. Unchanged tracheostomy. No acute osseous findings. IMPRESSION: Improved aeration.  Mild increased perihilar markings Electronically Signed   By: Staci Righter M.D.   On: 01/15/2019 07:06   Resolved Hospital Problem list   AKI Pneumonia Hypokalemia Hypernatremia  Assessment & Plan:   RESP Acute Hypoxemic Respiratory Failure s/p Tracheostomy In setting of Flu A, Corynebacterium PNA, now new resp cx with Pseudomonas and persistent copious secretions, improved since restarting abx. No new CXR this am. Currently 10/5. - Will attempt to wean slowly prior to attempting ATC again. - continue ceftazidime, inhaled cilostin and mucomyst via metaneb - Trach care per protocol  - Wean O2 for sats 88-95% - Continue Guaifenesin, free water  - s/p solumedrol - Continue chest PT  - Duoneb  - PMV efforts as able   CARDIAC Hx HTN, HLD, CAD BP stable overnight.    - Continue ASA, lipitor, coreg - losartan d/ced Cardiomyopathy evidenced via 1/4 ECHO, resolved.  ENDOCRINE DM - H/o poorly controlled, hx amputation. CBGs 100-200s. - SSI, resistant scale  - Levemir 35  units BID   NEURO Agitated Delirium, resolved  - Minimize sedating medications as able  - Continue seroquel BID - Klonopin BID   HEME Anemia - No overt signs of bleeding. - monitor CBC  - Transfuse per ICU guidelines  - Lovenox for DVT prophylaxis   DERM Sacral Decubitus Ulcer WOC following, receiving hydrotherapy   FEN/GI Moderate Protein Calorie Malnutrition  - Continue TF per Nutrition  - Nutritional supplement  - MVI, folic acid - PEG care per protocol  - colace, dulcolax prn  CC Time:     Rory Percy, DO PGY-2, [facility] 01/16/2019 7:14 AM

## 2019-01-16 NOTE — Progress Notes (Signed)
Inpatient Diabetes Program Recommendations  AACE/ADA: New Consensus Statement on Inpatient Glycemic Control (2015)  Target Ranges:  Prepandial:   less than 140 mg/dL      Peak postprandial:   less than 180 mg/dL (1-2 hours)      Critically ill patients:  140 - 180 mg/dL   Lab Results  Component Value Date   GLUCAP 94 01/16/2019   HGBA1C 12.3 (H) 03/25/2017    Review of Glycemic Control Results for BISHOP, HASBROOK (MRN [id number]) as of 01/16/2019 08:42  Ref. Range 01/15/2019 23:51 01/16/2019 03:40 01/16/2019 07:10  Glucose-Capillary Latest Ref Range: 70 - 99 mg/dL 179 (H) 150 (H) 94   Diabetes history: DM 2 Outpatient Diabetes medications:  Glipizide 10 mg bid, Tresiba 70 units q HS, Humulin R- 2-4 units PRN if >150 mg/dL Current orders for Inpatient glycemic control:  Levemir 50 units bid, Novolog 0-20 units q 4 hours Inpatient Diabetes Program Recommendations:   Note blood sugars < 100 mg/dL.  Please consider reducing Levemir to 35 units bid.    Thanks,  Beryl Meager, RN, BC-ADM Inpatient Diabetes Coordinator Pager [phone] (8a-5p)

## 2019-01-16 NOTE — Progress Notes (Signed)
Physical Therapy Treatment Patient Details Name: Antonio [name]. MRN: [id number] DOB: 1956/07/05 Today's Date: 01/16/2019    History of Present Illness 63 yo admitted with flu A and PNA with acute respiratory failure intubated 1/4, trach 1/16 with course complicated by acute MI, CHF and cardiogenic shock and mucous plugging. Transferred to ICU late 2/13/early 2/14 due to dyspnea and drop in sats with thick mucous.PMhx: DM, HTN, HLD, CAD, Lt BKA, rt toe amputation    PT Comments    Patient doing well this visit, more compliant and appropriate with therapy. Standing x5 with min guard/stand by assist towards end of session. Side stepping along bed without assistance. Feel patient ready to progress to ambulation next session. VSS on PRVC FiO2 30% PEEP 5.      Follow Up Recommendations  Supervision/Assistance - 24 hour;SNF     Equipment Recommendations  Rolling walker with 5" wheels;Wheelchair (measurements PT);Wheelchair cushion (measurements PT);Hospital bed;Other (comment)    Recommendations for Other Services OT consult     Precautions / Restrictions Precautions Precautions: Fall;Other (comment) Precaution Comments: monitor vent/respiratory status- lots of secreations worth deep suctioning prior to and possibly after mobility.  Required Braces or Orthoses: Other Brace Other Brace: LLE prothesis Restrictions Weight Bearing Restrictions: Yes LLE Weight Bearing: Weight bearing as tolerated    Mobility  Bed Mobility Overal bed mobility: Needs Assistance   Rolling: Mod assist Sidelying to sit: Mod assist          Transfers Overall transfer level: Needs assistance Equipment used: Rolling walker (2 wheeled) Transfers: Sit to/from Stand Sit to Stand: Min guard Stand pivot transfers: Min guard       General transfer comment: min guard and cues for hand placement, patient abl to stand with incraesed time and effort, with improving fluidity x5 this visit. side steps  to head of bed with contact gaurd as well.   Ambulation/Gait                 Stairs             Wheelchair Mobility    Modified Rankin (Stroke Patients Only)       Balance Overall balance assessment: Needs assistance Sitting-balance support: Feet supported;Bilateral upper extremity supported Sitting balance-Leahy Scale: Fair Sitting balance - Comments: min guard to close supervision EOB working on EOB tolerance (sat for ~5-7 minutes before standing and after standing), sitting balance (OT had pt reach down to see if he could assist in doffing his prosthesis).       Standing balance-Leahy Scale: Poor                              Cognition Arousal/Alertness: Awake/alert Behavior During Therapy: Restless;Flat affect Overall Cognitive Status: Difficult to assess                             Awareness: Intellectual Problem Solving: Slow processing;Decreased initiation;Difficulty sequencing;Requires verbal cues;Requires tactile cues General Comments: seems appropriate today, nodding yes and no following cues. no impulsivity or agitation today.       Exercises      General Comments        Pertinent Vitals/Pain Pain Assessment: Faces Faces Pain Scale: Hurts little more Pain Location: generalized with mobility, and with peri care due to sacral wound Pain Descriptors / Indicators: Grimacing;Guarding Pain Intervention(s): Limited activity within patient's tolerance;Monitored during session    Home Living  Prior Function            PT Goals (current goals can now be found in the care plan section) Acute Rehab PT Goals Patient Stated Goal: pt continues to perseverate on water and eating PT Goal Formulation: With patient Time For Goal Achievement: 01/19/19 Potential to Achieve Goals: Good Progress towards PT goals: Progressing toward goals    Frequency    Min 2X/week      PT Plan Frequency needs  to be updated    Co-evaluation PT/OT/SLP Co-Evaluation/Treatment: Yes Reason for Co-Treatment: For patient/therapist safety;To address functional/ADL transfers PT goals addressed during session: Mobility/safety with mobility;Balance;Proper use of DME        AM-PAC PT "6 Clicks" Mobility   Outcome Measure  Help needed turning from your back to your side while in a flat bed without using bedrails?: A Lot Help needed moving from lying on your back to sitting on the side of a flat bed without using bedrails?: A Lot Help needed moving to and from a bed to a chair (including a wheelchair)?: A Lot Help needed standing up from a chair using your arms (e.g., wheelchair or bedside chair)?: A Lot Help needed to walk in hospital room?: Total Help needed climbing 3-5 steps with a railing? : Total 6 Click Score: 10    End of Session Equipment Utilized During Treatment: Other (comment) Activity Tolerance: Patient limited by fatigue Patient left: in bed;with call bell/phone within reach;with bed alarm set Nurse Communication: Mobility status PT Visit Diagnosis: Muscle weakness (generalized) (M62.81);Difficulty in walking, not elsewhere classified (R26.2)     Time: 1430-1500 PT Time Calculation (min) (ACUTE ONLY): 30 min  Charges:  $Therapeutic Activity: 8-22 mins                     Etta Grandchild, PT, DPT Acute Rehabilitation Services Pager: [phone] Office: [phone]     Etta Grandchild 01/16/2019, 3:11 PM

## 2019-01-16 NOTE — Progress Notes (Signed)
Physical Therapy Wound Treatment Patient Details  Name: Antonio [name]. MRN: [id number] Date of Birth: January 07, 1956  Today's Date: 01/16/2019 Time: 4742-5956 Time Calculation (min): 41 min  Subjective  Subjective: Pt asking how is it looking Patient and Family Stated Goals: Pt on vent Date of Onset: (unknown) Prior Treatments: dressing changes  Pain Score: Asleep through most of treatment  Wound Assessment  Pressure Injury 12/01/18 Unstageable - Full thickness tissue loss in which the base of the ulcer is covered by slough (yellow, tan, gray, green or brown) and/or eschar (tan, brown or black) in the wound bed. sacrum (Active)  Dressing Type Barrier Film (skin prep);ABD;Moist to moist;Gauze (Comment);Other (Comment) 01/16/2019  2:44 PM  Dressing Clean;Dry;Intact;Changed 01/16/2019  2:44 PM  Dressing Change Frequency Daily 01/16/2019  2:44 PM  State of Healing Early/partial granulation 01/16/2019  2:44 PM  Site / Wound Assessment Red;Yellow;Pink 01/16/2019  2:44 PM  % Wound base Red or Granulating 70% 01/16/2019  2:44 PM  % Wound base Yellow/Fibrinous Exudate 30% 01/16/2019  2:44 PM  % Wound base Black/Eschar 0% 01/16/2019  2:44 PM  % Wound base Other/Granulation Tissue (Comment) 0% 01/16/2019  2:44 PM  Peri-wound Assessment Erythema (blanchable) 01/16/2019  2:44 PM  Wound Length (cm) 9 cm 01/14/2019 10:25 AM  Wound Width (cm) 7.5 cm 01/14/2019 10:25 AM  Wound Depth (cm) 0.1 cm 01/14/2019 10:25 AM  Wound Surface Area (cm^2) 67.5 cm^2 01/14/2019 10:25 AM  Wound Volume (cm^3) 6.75 cm^3 01/14/2019 10:25 AM  Tunneling (cm) 0 12/31/2018 10:28 AM  Margins Unattached edges (unapproximated) 01/16/2019  2:44 PM  Drainage Amount Moderate 01/16/2019  2:44 PM  Drainage Description Purulent 01/16/2019  2:44 PM  Treatment Debridement (Selective);Hydrotherapy (Pulse lavage) 01/16/2019  2:44 PM     Hydrotherapy Pulsed lavage therapy - wound location: sacrum Pulsed Lavage with Suction (psi): 8 psi(8-12) Pulsed  Lavage with Suction - Normal Saline Used: 1000 mL Pulsed Lavage Tip: Tip with splash shield Selective Debridement Selective Debridement - Location: sacrum Selective Debridement - Tools Used: Forceps;Scissors;Scalpel Selective Debridement - Tissue Removed: yellow necrotic tissue   Wound Assessment and Plan  Wound Therapy - Assess/Plan/Recommendations Wound Therapy - Clinical Statement: Wound with essentially no change in characteristics; no green drainage noted this session. Continue to work at superior/proximal opening in wound to remove yellow slough. Distal portion of wound continues with red/pink granulation. Initiating santyl next session. Will continue hydrotherapy for removal of nonviable tissue. Wound Therapy - Functional Problem List: decr mobility Factors Delaying/Impairing Wound Healing: Diabetes Mellitus;Immobility;Multiple medical problems Hydrotherapy Plan: Debridement;Dressing change;Patient/family education;Pulsatile lavage with suction Wound Therapy - Frequency: 3X / week Wound Therapy - Follow Up Recommendations: Skilled nursing facility Wound Plan: see above  Wound Therapy Goals- Improve the function of patient's integumentary system by progressing the wound(s) through the phases of wound healing (inflammation - proliferation - remodeling) by: Decrease Necrotic Tissue to: 20 Decrease Necrotic Tissue - Progress: Progressing toward goal Increase Granulation Tissue to: 80 Increase Granulation Tissue - Progress: Progressing toward goal Goals/treatment plan/discharge plan were made with and agreed upon by patient/family: Yes Time For Goal Achievement: 7 days Wound Therapy - Potential for Goals: Fair  Goals will be updated until maximal potential achieved or discharge criteria met.  Discharge criteria: when goals achieved, discharge from hospital, MD decision/surgical intervention, no progress towards goals, refusal/missing three consecutive treatments without notification or  medical reason.  GP    [name], PT, DPT Acute Rehabilitation Services Pager [phone] Office [phone]   [name] 01/16/2019,  2:48 PM

## 2019-01-16 NOTE — Progress Notes (Signed)
SLP Cancellation Note  Patient Details Name: Antonio [name]. MRN: [id number] DOB: June 19, 1956   Cancelled treatment:       Reason Eval/Treat Not Completed: Other (comment). Discussed consideration of inline PMSV with MD and RT. MD gave ok to attempt, RT reports pts successfully weaning today and also given sedation for hydrotherapy. Will f/u to check in for potential for PMSV inline or on trach collar as able.    Kadden Osterhout, Riley Nearing 01/16/2019, 12:10 PM

## 2019-01-16 NOTE — Progress Notes (Signed)
Occupational Therapy Treatment Patient Details Name: Antonio [name]. MRN: [id number] DOB: 1956-10-01 Today's Date: 01/16/2019    History of present illness 63 yo admitted with flu A and PNA with acute respiratory failure intubated 1/4, trach 1/16 with course complicated by acute MI, CHF and cardiogenic shock and mucous plugging. Transferred to ICU late 2/13/early 2/14 due to dyspnea and drop in sats with thick mucous.PMhx: DM, HTN, HLD, CAD, Lt BKA, rt toe amputation   OT comments  Pt with improved cognition. Demonstrated ability to don L prosthesis with set up, continues to need max assist for R shoe and sock. Pt stood from EOB x 5  Follow Up Recommendations  LTACH    Equipment Recommendations       Recommendations for Other Services      Precautions / Restrictions Precautions Precautions: Fall;Other (comment) Precaution Comments: pt on ventilator Required Braces or Orthoses: Other Brace Other Brace: LLE prothesis Restrictions Weight Bearing Restrictions: Yes LLE Weight Bearing: Weight bearing as tolerated       Mobility Bed Mobility Overal bed mobility: Needs Assistance Bed Mobility: Supine to Sit;Sit to Supine   Supine to sit: Mod assist  Sit to Supine: Mod assist     General bed mobility comments: assist for LEs and to raise trunk  Transfers Overall transfer level: Needs assistance Equipment used: Rolling walker (2 wheeled) Transfers: Sit to/from Stand Sit to Stand: Min guard        General transfer comment: min guard and cues for hand placement, patient abl to stand with incraesed time and effort, with improving fluidity x5 this visit. side steps to head of bed with contact gaurd as well.     Balance Overall balance assessment: Needs assistance Sitting-balance support: Feet supported;Bilateral upper extremity supported Sitting balance-Leahy Scale: Fair Sitting balance - Comments: min guard to close supervision EOB working on EOB tolerance (sat for  ~5-7 minutes before standing and after standing)     Standing balance-Leahy Scale: Poor                             ADL either performed or assessed with clinical judgement   ADL Overall ADL's : Needs assistance/impaired                     Lower Body Dressing: Moderate assistance;Sitting/lateral leans Lower Body Dressing Details (indicate cue type and reason): max for R shoe and sock, min guard for L LE     Toileting- Clothing Manipulation and Hygiene: Total assistance;Sit to/from stand;+2 for safety/equipment       Functional mobility during ADLs: +2 for physical assistance;Minimal assistance;Rolling walker(took several steps along EOB)       Vision       Perception     Praxis      Cognition Arousal/Alertness: Awake/alert Behavior During Therapy: Flat affect Overall Cognitive Status: Difficult to assess Area of Impairment: Following commands;Safety/judgement;Awareness;Problem solving                       Following Commands: Follows one step commands with increased time Safety/Judgement: Decreased awareness of safety;Decreased awareness of deficits Awareness: Intellectual Problem Solving: Slow processing;Decreased initiation;Difficulty sequencing;Requires verbal cues;Requires tactile cues General Comments: seems appropriate today, nodding yes and no following cues. no impulsivity or agitation today.         Exercises     Shoulder Instructions       General Comments  Pertinent Vitals/ Pain       Pain Assessment: Faces Faces Pain Scale: Hurts little more Pain Location: generalized with mobility, and with peri care due to sacral wound Pain Descriptors / Indicators: Grimacing;Guarding Pain Intervention(s): Limited activity within patient's tolerance;Monitored during session  Home Living                                          Prior Functioning/Environment              Frequency  Min 2X/week         Progress Toward Goals  OT Goals(current goals can now be found in the care plan section)  Progress towards OT goals: Progressing toward goals  Acute Rehab OT Goals Patient Stated Goal: pt agreeable to therapy OT Goal Formulation: With patient Time For Goal Achievement: 01/28/19 Potential to Achieve Goals: Good  Plan Discharge plan remains appropriate;Frequency remains appropriate    Co-evaluation    PT/OT/SLP Co-Evaluation/Treatment: Yes Reason for Co-Treatment: For patient/therapist safety;Complexity of the patient's impairments (multi-system involvement) PT goals addressed during session: Mobility/safety with mobility;Balance;Proper use of DME OT goals addressed during session: ADL's and self-care;Strengthening/ROM      AM-PAC OT "6 Clicks" Daily Activity     Outcome Measure   Help from another person eating meals?: Total Help from another person taking care of personal grooming?: A Lot Help from another person toileting, which includes using toliet, bedpan, or urinal?: Total Help from another person bathing (including washing, rinsing, drying)?: A Lot Help from another person to put on and taking off regular upper body clothing?: A Little Help from another person to put on and taking off regular lower body clothing?: A Lot 6 Click Score: 11    End of Session Equipment Utilized During Treatment: Other (comment)(vent)  OT Visit Diagnosis: Unsteadiness on feet (R26.81);Other abnormalities of gait and mobility (R26.89);Muscle weakness (generalized) (M62.81);Other symptoms and signs involving cognitive function   Activity Tolerance Patient tolerated treatment well   Patient Left in bed;with call bell/phone within reach;with bed alarm set   Nurse Communication (aware that condom cath is off)        Time: 2353-6144 OT Time Calculation (min): 34 min  Charges: OT General Charges $OT Visit: 1 Visit OT Treatments $Therapeutic Activity: 8-22 mins  Martie Round, OTR/L Acute Rehabilitation Services Pager: [phone] Office: [phone]   Evern Bio 01/16/2019, 3:59 PM

## 2019-01-16 NOTE — Consult Note (Signed)
[place] Nurse wound follow up Wound type: sacral wound; Unstageable pressure injury. Should be noted that upon admission this patient had had recent I&D in the same area.  Measurement: 9cmx 10cm x 1.0cm new area of fluctuance noted at the most proximal edge of the wound bed, filled with yellow slough. I am not able to penetrate with a sterile swab Wound bed: 70% pink, some early granulation at wound edges; 30% yellow non viable tissue, with most of the slough concentrated at the proximal aspect of the wound bed  Drainage (amount, consistency, odor) moderate, yellow, no odor Periwound: intact  Dressing procedure/placement/frequency: Met with PT for hydrotherapy at the bedside. Assessment of wound as above.  Discussed with PT and patient, will restart enzymatic debridement ointment. The drainage is not indicative of anything like pseudomonas. There is a new area along the proximal wound base that I have notified CCM of and requested a possible surgical consultation.  May be appropriate for a NPWT dressing once the remainder of the slough can be removed and the true depth of the wound has been determined.   [place] Nurse team will follow with you and see patient within 10 days for wound assessments.  Please notify [facility] nurses of any acute changes in the wounds or any new areas of concern [place] MSN, [place], [place], [place]

## 2019-01-16 NOTE — Consult Note (Signed)
[facility] Consult/Admission Note  Antonio RichardsDaniel Watson Wires Jr. August 21, 1956  [id number].    Requesting MD: Dr. Kendrick FriesMcQuaid  Chief Complaint/Reason for Consult: sacral wound  HPI:   Pt is a [age] yo male with a hx of CAD, DM, HLD, HTN, L BKA who was admitted on 11/28/18 with influenza A causing respiratory failure and cardiogenic shock. Prolonged mechanical ventilation, tracheostomy placed on January 16.  Developed HCAP.  WOC nurse states pt had a "boil" in his sacral region I&D'd prior to his hospital admission and now this area appears to have a sacral wound. This area has been treated with hydrotherapy. We have been asked to see. Pt has a trach, is on the ventilator and is unable to speak. History was difficult to obtain. Pt did state pain in this area.   ROS:  Review of Systems  Unable to perform ROS: Intubated     Family History  Problem Relation Age of Onset  . Diabetes Mother   . Emphysema Father   . CAD Sister 2362       Died of MI after flu    Past Medical History:  Diagnosis Date  . Coronary artery disease   . Diabetes mellitus without complication (HCC)   . Hyperlipidemia   . Hypertension   . Osteomyelitis (HCC) 03/25/2017   RT FOOT    Past Surgical History:  Procedure Laterality Date  . AMPUTATION Right 03/27/2017   Procedure: 1st Ray Amputation Right Foot;  Surgeon: Nadara MustardMarcus Duda V, MD;  [facility];  Service: Orthopedics;  Laterality: Right;  . APPENDECTOMY    . BELOW KNEE LEG AMPUTATION Left   . CARDIAC CATHETERIZATION    . CORONARY STENT INTERVENTION  2005  . Great toe amputation right.    . I&D EXTREMITY Left 12/30/2015   Procedure: IRRIGATION AND DEBRIDEMENT LEFT FOOT, TRANSMETATARSAL AMPUTATION WITH APPLICATION OF ANTIBIOTIC BEADS AND WOUND VAC;  Surgeon: Nadara MustardMarcus Duda V, MD;  Location: [facility] OR;  Service: Orthopedics;  Laterality: Left;  . IR GASTROSTOMY TUBE MOD SED  12/23/2018  . TONSILLECTOMY    . TRACHEOSTOMY  11/2018    Social History:  reports that he  quit smoking about 7 weeks ago. His smoking use included cigarettes. He has a 70.00 pack-year smoking history. He has never used smokeless tobacco. He reports current alcohol use. He reports that he does not use drugs.  Allergies:  Allergies  Allergen Reactions  . Iodinated Diagnostic Agents Hives, Rash and Other (See Comments)    Blisters Staph Blisters / staph  . Penicillins Other (See Comments)    UNSPECIFIED REACTION  Has patient had a PCN reaction causing immediate rash, facial/tongue/throat swelling, SOB or lightheadedness with hypotension: No Has patient had a PCN reaction causing severe rash involving mucus membranes or skin necrosis: No Has patient had a PCN reaction that required hospitalization: No Has patient had a PCN reaction occurring within the last 10 years: No If all of the above answers are "NO", then may proceed with Cephalosporin use.    Medications Prior to Admission  Medication Sig Dispense Refill  . aspirin EC 81 MG tablet Take 81 mg by mouth daily.    Marland Kitchen. atorvastatin (LIPITOR) 40 MG tablet Take 1 tablet (40 mg total) by mouth daily at 6 PM. 30 tablet 0  . glipiZIDE (GLUCOTROL) 10 MG tablet Take 10 mg by mouth 2 (two) times daily before a meal.    . Insulin Degludec (TRESIBA) 100 UNIT/ML SOLN Inject 70 Units into the skin  at bedtime.     . insulin glargine (LANTUS) 100 UNIT/ML injection Inject 0.05 mLs (5 Units total) into the skin at bedtime. 10 mL 11  . insulin regular (NOVOLIN R,HUMULIN R) 100 units/mL injection Inject 2-4 Units into the skin as needed for high blood sugar. Only of over 150 mg    . lisinopril (PRINIVIL,ZESTRIL) 5 MG tablet Take 5 mg by mouth 2 (two) times daily.    . metoprolol tartrate (LOPRESSOR) 25 MG tablet Take 25 mg by mouth 2 (two) times daily.    . Multiple Vitamin (MULTIVITAMIN) tablet Take 1 tablet by mouth daily.    . tapentadol HCl (NUCYNTA) 75 MG tablet Take 75 mg by mouth 4 (four) times daily.       Blood pressure 98/62, pulse  70, temperature 98 F (36.7 C), temperature source Oral, resp. rate 20, height 6' (1.829 m), weight 91.1 kg, SpO2 97 %.  Physical Exam Constitutional:      General: He is not in acute distress.    Appearance: He is normal weight. He is not ill-appearing, toxic-appearing or diaphoretic.     Interventions: He is intubated.     Comments: trach  HENT:     Head: Normocephalic and atraumatic.     Nose: Nose normal.     Mouth/Throat:     Lips: Pink.     Mouth: Mucous membranes are moist.     Pharynx: Oropharynx is clear.  Eyes:     General: No scleral icterus.       Right eye: No discharge.        Left eye: No discharge.     Conjunctiva/sclera: Conjunctivae normal.     Pupils: Pupils are equal, round, and reactive to light.  Neck:     Musculoskeletal: Normal range of motion and neck supple.     Trachea: Tracheostomy present.  Pulmonary:     Effort: Pulmonary effort is normal. No respiratory distress. He is intubated.  Abdominal:     General: There is no distension.     Palpations: Abdomen is not rigid.  Genitourinary:    Comments: Sacral wound without an area of fluctuance, surrounding erythema (see photo below) Musculoskeletal: Normal range of motion.        General: Deformity (L BKA) present.  Skin:    General: Skin is warm and dry.     Findings: No rash.  Neurological:     Mental Status: He is alert and oriented to person, place, and time.        Results for orders placed or performed during the hospital encounter of 11/28/18 (from the past 48 hour(s))  Glucose, capillary     Status: Abnormal   Collection Time: 01/14/19  3:30 PM  Result Value Ref Range   Glucose-Capillary 208 (H) 70 - 99 mg/dL  Glucose, capillary     Status: Abnormal   Collection Time: 01/14/19  7:41 PM  Result Value Ref Range   Glucose-Capillary 224 (H) 70 - 99 mg/dL  Glucose, capillary     Status: Abnormal   Collection Time: 01/14/19 11:24 PM  Result Value Ref Range   Glucose-Capillary 173 (H)  70 - 99 mg/dL  Glucose, capillary     Status: Abnormal   Collection Time: 01/15/19  3:36 AM  Result Value Ref Range   Glucose-Capillary 136 (H) 70 - 99 mg/dL  Glucose, capillary     Status: Abnormal   Collection Time: 01/15/19  7:44 AM  Result Value Ref Range   Glucose-Capillary  123 (H) 70 - 99 mg/dL  Glucose, capillary     Status: Abnormal   Collection Time: 01/15/19 11:17 AM  Result Value Ref Range   Glucose-Capillary 113 (H) 70 - 99 mg/dL  Glucose, capillary     Status: Abnormal   Collection Time: 01/15/19  3:20 PM  Result Value Ref Range   Glucose-Capillary 228 (H) 70 - 99 mg/dL  Glucose, capillary     Status: Abnormal   Collection Time: 01/15/19  9:09 PM  Result Value Ref Range   Glucose-Capillary 275 (H) 70 - 99 mg/dL  Glucose, capillary     Status: Abnormal   Collection Time: 01/15/19 11:51 PM  Result Value Ref Range   Glucose-Capillary 232 (H) 70 - 99 mg/dL  CBC     Status: Abnormal   Collection Time: 01/16/19  3:38 AM  Result Value Ref Range   WBC 6.6 4.0 - 10.5 K/uL   RBC 3.15 (L) 4.22 - 5.81 MIL/uL   Hemoglobin 8.4 (L) 13.0 - 17.0 g/dL   HCT 94.8 (L) 54.6 - 27.0 %   MCV 88.9 80.0 - 100.0 fL   MCH 26.7 26.0 - 34.0 pg   MCHC 30.0 30.0 - 36.0 g/dL   RDW 35.0 09.3 - 81.8 %   Platelets 258 150 - 400 K/uL   nRBC 0.0 0.0 - 0.2 %    Comment: Performed at [facility], [address] [place], [place] [address]  Basic metabolic panel     Status: Abnormal   Collection Time: 01/16/19  3:38 AM  Result Value Ref Range   Sodium 138 135 - 145 mmol/L   Potassium 4.5 3.5 - 5.1 mmol/L   Chloride 104 98 - 111 mmol/L   CO2 26 22 - 32 mmol/L   Glucose, Bld 187 (H) 70 - 99 mg/dL   BUN 44 (H) 6 - 20 mg/dL   Creatinine, Ser 1.69 0.61 - 1.24 mg/dL   Calcium 8.7 (L) 8.9 - 10.3 mg/dL   GFR calc non Af Amer >60 >60 mL/min   GFR calc Af Amer >60 >60 mL/min   Anion gap 8 5 - 15    Comment: Performed at [facility], [address], [place], [place] [address]  Glucose,  capillary     Status: Abnormal   Collection Time: 01/16/19  3:40 AM  Result Value Ref Range   Glucose-Capillary 175 (H) 70 - 99 mg/dL  Glucose, capillary     Status: None   Collection Time: 01/16/19  7:10 AM  Result Value Ref Range   Glucose-Capillary 94 70 - 99 mg/dL  Glucose, capillary     Status: None   Collection Time: 01/16/19 11:40 AM  Result Value Ref Range   Glucose-Capillary 92 70 - 99 mg/dL   Dg Chest Port 1 View  Result Date: 01/15/2019 CLINICAL DATA:  Pulmonary insufficiency. SOB. EXAM: PORTABLE CHEST 1 VIEW COMPARISON:  01/12/2019. FINDINGS: Unchanged cardiomediastinal silhouette. Mild increased perihilar markings without consolidation or edema. No bony abnormality. Unchanged tracheostomy. No acute osseous findings. IMPRESSION: Improved aeration.  Mild increased perihilar markings Electronically Signed   By: Elsie Stain M.D.   On: 01/15/2019 07:06      Assessment/Plan Active Problems:   Chest pain   SIRS (systemic inflammatory response syndrome) (HCC)   Acute respiratory failure with hypoxemia (HCC)   Pressure injury of skin   Influenza A virus present   Pneumonia   Tracheostomy status (HCC)   Acute pulmonary edema (HCC)  Hypoxia   Fever, unspecified   Tachycardia   Acute on chronic respiratory failure (HCC)   Hypoxemia   Phlegm in throat  Sacral Wound - wound looks clean, no necrotic tissue, no abscess appreciated, no concerns for infection at this time.  - no indication for surgery at this time - recommend continuing hydrotherapy and frequent turning - we will sign off but please page [place] with any further needs for this patient   Jerre Simon, [facility] Surgery 01/16/2019, 2:10 PM Pager: [phone] Consults: [phone] Mon-Fri 7:00 am-4:30 pm Sat-Sun 7:00 am-11:30 am

## 2019-01-17 LAB — GLUCOSE, CAPILLARY
GLUCOSE-CAPILLARY: 118 mg/dL — AB (ref 70–99)
GLUCOSE-CAPILLARY: 81 mg/dL (ref 70–99)
Glucose-Capillary: 77 mg/dL (ref 70–99)
Glucose-Capillary: 117 mg/dL — ABNORMAL HIGH (ref 70–99)
Glucose-Capillary: 104 mg/dL — ABNORMAL HIGH (ref 70–99)
Glucose-Capillary: 128 mg/dL — ABNORMAL HIGH (ref 70–99)
Glucose-Capillary: 141 mg/dL — ABNORMAL HIGH (ref 70–99)
Glucose-Capillary: 84 mg/dL (ref 70–99)

## 2019-01-17 LAB — BASIC METABOLIC PANEL
Anion gap: 8 (ref 5–15)
BUN: 35 mg/dL — ABNORMAL HIGH (ref 6–20)
CO2: 26 mmol/L (ref 22–32)
Calcium: 8.6 mg/dL — ABNORMAL LOW (ref 8.9–10.3)
Chloride: 102 mmol/L (ref 98–111)
Creatinine, Ser: 0.68 mg/dL (ref 0.61–1.24)
GFR calc Af Amer: >60 mL/min (ref >60)
GFR calc non Af Amer: >60 mL/min (ref >60)
Glucose, Bld: 119 mg/dL — ABNORMAL HIGH (ref 70–99)
Potassium: 4.3 mmol/L (ref 3.5–5.1)
Sodium: 136 mmol/L (ref 135–145)

## 2019-01-17 MED ORDER — DEXTROSE 50 % IV SOLN
INTRAVENOUS | Status: AC
  Administered 2019-01-17: 25 mL
  Filled 2019-01-17: qty 50

## 2019-01-17 MED ORDER — ALUM & MAG HYDROXIDE-SIMETH 200-200-20 MG/5ML PO SUSP
30.0000 mL | Freq: Four times a day (QID) | ORAL | Status: DC | PRN
  Administered 2019-01-18 – 2019-01-24 (×2): 30 mL via ORAL
  Filled 2019-01-17 (×4): qty 30

## 2019-01-17 MED ORDER — FENTANYL 25 MCG/HR TD PT72
1.0000 | MEDICATED_PATCH | TRANSDERMAL | Status: DC
  Administered 2019-01-17 – 2019-01-23 (×2): 1 via TRANSDERMAL
  Filled 2019-01-17 (×2): qty 1

## 2019-01-17 MED ORDER — LIDOCAINE VISCOUS HCL 2 % MT SOLN
15.0000 mL | Freq: Four times a day (QID) | OROMUCOSAL | Status: DC | PRN
  Filled 2019-01-17: qty 15

## 2019-01-17 NOTE — Progress Notes (Signed)
Pt's blood sugar was 77 @ 0415. 1/2 amp of D5 given and blood sugar now 117. Will continue to monitor.

## 2019-01-17 NOTE — Progress Notes (Signed)
NAME:  Antonio Bolger., MRN:  [id number], DOB:  1956/08/20, LOS: 51 ADMISSION DATE:  11/28/2018, CONSULTATION DATE:  1/3 REFERRING MD:  EDP, CHIEF COMPLAINT:  Dyspnea   Brief History   63 y/o M admitted 1/3 with influenza A causing respiratory failure and cardiogenic shock. Prolonged mechanical ventilation, tracheostomy placed on January 16.  Developed HCAP.  Difficult wean, has failed multiple attempts at downsize with mucus plugging. Repeat resp cx 2/18 Pseduomonas.  Past Medical History  DM2, CAD, HTN, HLD, tobacco use  Significant Hospital Events   1/03  Admit, Influenza A positive 1/06  Self extubated  1/07  Re-intubated  1/08  Self-extubated  1/09  Reintubated, mucus plugging on FOB 1/10  Fever to 103, abx restarted  1/16  Diuresing, increased WOB with weaning efforts.  Trach.  1/20  Failed wean, anxiety / secretions 1/21  Trach revision to XLT distal.  FOB  1/22  Thick secretions barrier to wean  1/24  Failed SBT, fever 1/25  Episode of mucus plugging after chest PT/wound care  2/03  Weaned 15 minutes  2/11  ATC  2/14  Trach changed to #4, mucus plugging >back to #5 XLT 2/15  Failed ATC with increased WOB 2/16  Failed ATC with increased WOB  2/21 back on pressure support  Consults:  PCCM Cardiology  General surgery 2/21 for sacral wound  Procedures:  ETT 1/3 >>1/6, 1/7 > 1/8> 1/9> 1/16 Trach 1/16 >>  PEG 1/28 >>     Significant Diagnostic Tests:  UDS 1/4 >negative  ECHO 1/4 >LVEF 20-25%, akinesis of the anteroseptal, anterolateral & apical myocardium, grade 1 diastolic dysfunction, small pericardial effusion without evidence of hemodynamic compromise. CT Chest / ABD / Pelvis 1/3 > airway thickening, motion artifact, CAD Echo 1/29> LVEF 60-65%, left ventricular hypertrophy, trivial pericardial effusion  Micro Data:  RVP 1/3 > positive for influenza A BCx2 1/3 >No growth UC 1/4 >No Growth Sputum Cx 1/7 > Normal respiratory flora BAL  >corynebacterium  Blood Cx 1/10 > neg Sputum 1/21 > abundant corynebacterium striatum Sputum 2/18 > Pseudomonas  Antimicrobials:  Vanco 1/3 x1;    Zosyn 1/30 x1  Tamiflu 1/3 >1/8 Vancomycin 1/10 > 1/12 Ceftaz 1/10 > 1/12; 1/21 >1/22 Emycin 1/12 > 1/22 Cefepime 1/23 > 1/24 Vanc 1/21> 1/28 Colistin 2/19 > Meropenem 2/19 > 2/20 Ceftaz 2/20 >  Interim history/subjective:  Weaned for 4-5 hours yesterday  Objective   Blood pressure 110/68, pulse 63, temperature 98.2 F (36.8 C), temperature source Oral, resp. rate 20, height 6' (1.829 m), weight 91 kg, SpO2 100 %.    Vent Mode: PRVC FiO2 (%):  [30 %] 30 % Set Rate:  [20 bmp] 20 bmp Vt Set:  [620 mL] 620 mL PEEP:  [5 cmH20] 5 cmH20 Pressure Support:  [10 cmH20] 10 cmH20 Plateau Pressure:  [10 cmH20-14 cmH20] 10 cmH20   Intake/Output Summary (Last 24 hours) at 01/17/2019 1055 Last data filed at 01/17/2019 0700 Gross per 24 hour  Intake 3451.01 ml  Output 1100 ml  Net 2351.01 ml   Filed Weights   01/12/19 0400 01/16/19 0500 01/17/19 0355  Weight: 89.4 kg 91.1 kg 91 kg   EXAM:   General:  Resting comfortably in bed HENT: NCAT trach in place PULM: CTA B, vent supported breathing CV: RRR, no mgr GI: BS+, soft, nontender MSK: normal bulk and tone Neuro: awake, alert, no distress, MAEW   LABS   PULMONARY No results for input(s): PHART, PCO2ART, PO2ART, HCO3, TCO2,  O2SAT in the last 168 hours.  Invalid input(s): PCO2, PO2 CBC Recent Labs  Lab 01/13/19 0837 01/14/19 0747 01/16/19 0338  HGB 7.9* 7.9* 8.4*  HCT 27.1* 26.0* 28.0*  WBC 7.2 7.0 6.6  PLT 244 266 258   COAGULATION No results for input(s): INR in the last 168 hours.  CARDIAC   No results for input(s): TROPONINI in the last 168 hours. No results for input(s): PROBNP in the last 168 hours.  CHEMISTRY Recent Labs  Lab 01/11/19 0241 01/12/19 0324 01/13/19 0837 01/16/19 0338 01/17/19 0539  NA 145 144 143 138 136  K 4.7 3.9 3.8 4.5 4.3  CL  107 107 105 104 102  CO2 33* _0 GLUCOSE 182* 135* 109* 187* 119*  BUN 78* 88* 85* 44* 35*  CREATININE 1.14 1.29* 1.05 0.69 0.68  CALCIUM 8.8* 8.5* 8.2* 8.7* 8.6*  MG 2.4  --   --   --   --    Estimated Creatinine Clearance: 107.8 mL/min (by C-G formula based on SCr of 0.68 mg/dL).  LIVER Recent Labs  Lab 01/11/19 0241  AST 29  ALT 24  ALKPHOS 69  BILITOT 0.9  PROT 7.4  ALBUMIN 2.0*   INFECTIOUS No results for input(s): LATICACIDVEN, PROCALCITON in the last 168 hours. ENDOCRINE CBG (last 3)  Recent Labs    01/17/19 0414 01/17/19 0511 01/17/19 0734  GLUCAP 77 117* 104*   IMAGING  No results found. Resolved Hospital Problem list   AKI Pneumonia Hypokalemia Hypernatremia Cardiomyopathy evidenced via 1/4 ECHO, resolved.  Assessment & Plan:   RESP Acute Hypoxemic Respiratory Failure s/p Tracheostomy In setting of Flu A, Corynebacterium PNA, now new resp cx with Pseudomonas and persistent copious secretions, improved since restarting abx. Tolerating pressure support well - continue IV antibiotics for 5 days total - colistin inhaled 14 days - pulm toilette - Wean O2 for O2 saturation 88-95% - continue guaifenesin - metaneb daily    CARDIAC Hx HTN, HLD, CAD BP stable overnight.    > continue asa, lipitor, core > losartan d/c'd   ENDOCRINE DM - H/o poorly controlled, hx amputation. CBGs 100-200s. - SSI, levemir  NEURO Agitated Delirium, resolved  -seroquel, klonopin to continue  HEME Anemia - No overt signs of bleeding. - monitor CBC - transfuse for ICU guidelines - lovenox for dvt prophylaxis  DERM Sacral Decubitus Ulcer - wound care consult - general surgery consulted on 2/22> appreciate their input  FEN/GI Moderate Protein Calorie Malnutrition  - continue tube feeding - continue MVI, folate - PEG care per routine  Roselie Awkward, MD [place] [facility]M Pager: [phone] Cell: [phone] If no response, call  [phone]    01/17/2019 10:55 AM

## 2019-01-18 LAB — CBC WITH DIFFERENTIAL/PLATELET
Abs Immature Granulocytes: 0.13 10*3/uL — ABNORMAL HIGH (ref 0.00–0.07)
BASOS ABS: 0 10*3/uL (ref 0.0–0.1)
Basophils Relative: 0 %
Eosinophils Absolute: 0.3 10*3/uL (ref 0.0–0.5)
Eosinophils Relative: 4 %
HCT: 33.7 % — ABNORMAL LOW (ref 39.0–52.0)
Hemoglobin: 10.1 g/dL — ABNORMAL LOW (ref 13.0–17.0)
Immature Granulocytes: 2 %
Lymphocytes Relative: 29 %
Lymphs Abs: 2.1 10*3/uL (ref 0.7–4.0)
MCH: 26.8 pg (ref 26.0–34.0)
MCHC: 30 g/dL (ref 30.0–36.0)
MCV: 89.4 fL (ref 80.0–100.0)
Monocytes Absolute: 0.5 10*3/uL (ref 0.1–1.0)
Monocytes Relative: 6 %
NEUTROS ABS: 4.1 10*3/uL (ref 1.7–7.7)
NEUTROS PCT: 59 %
Platelets: 249 10*3/uL (ref 150–400)
RBC: 3.77 MIL/uL — ABNORMAL LOW (ref 4.22–5.81)
RDW: 15.9 % — ABNORMAL HIGH (ref 11.5–15.5)
WBC: 7.1 10*3/uL (ref 4.0–10.5)
nRBC: 0 % (ref 0.0–0.2)

## 2019-01-18 LAB — GLUCOSE, CAPILLARY
GLUCOSE-CAPILLARY: 167 mg/dL — AB (ref 70–99)
Glucose-Capillary: 134 mg/dL — ABNORMAL HIGH (ref 70–99)
Glucose-Capillary: 91 mg/dL (ref 70–99)
Glucose-Capillary: 144 mg/dL — ABNORMAL HIGH (ref 70–99)
Glucose-Capillary: 158 mg/dL — ABNORMAL HIGH (ref 70–99)

## 2019-01-18 LAB — BASIC METABOLIC PANEL
Anion gap: 6 (ref 5–15)
BUN: 30 mg/dL — AB (ref 6–20)
CO2: 28 mmol/L (ref 22–32)
Calcium: 8.5 mg/dL — ABNORMAL LOW (ref 8.9–10.3)
Chloride: 101 mmol/L (ref 98–111)
Creatinine, Ser: 0.59 mg/dL — ABNORMAL LOW (ref 0.61–1.24)
GFR calc Af Amer: >60 mL/min (ref >60)
GFR calc non Af Amer: >60 mL/min (ref >60)
Glucose, Bld: 113 mg/dL — ABNORMAL HIGH (ref 70–99)
Potassium: 4.5 mmol/L (ref 3.5–5.1)
Sodium: 135 mmol/L (ref 135–145)

## 2019-01-18 MED ORDER — CHLORHEXIDINE GLUCONATE 0.12 % MT SOLN
OROMUCOSAL | Status: AC
  Filled 2019-01-18: qty 15

## 2019-01-18 NOTE — Progress Notes (Signed)
NAME:  Riyaan Heroux., MRN:  [id number], DOB:  09/19/1956, LOS: 79 ADMISSION DATE:  11/28/2018, CONSULTATION DATE:  1/3 REFERRING MD:  EDP, CHIEF COMPLAINT:  Dyspnea   Brief History   63 y/o M admitted 1/3 with influenza A causing respiratory failure and cardiogenic shock. Prolonged mechanical ventilation, tracheostomy placed on January 16.  Developed HCAP.  Difficult wean, has failed multiple attempts at downsize with mucus plugging. Repeat resp cx 2/18 Pseduomonas.  Past Medical History  DM2, CAD, HTN, HLD, tobacco use  Significant Hospital Events   1/03  Admit, Influenza A positive 1/06  Self extubated  1/07  Re-intubated  1/08  Self-extubated  1/09  Reintubated, mucus plugging on FOB 1/10  Fever to 103, abx restarted  1/16  Diuresing, increased WOB with weaning efforts.  Trach.  1/20  Failed wean, anxiety / secretions 1/21  Trach revision to XLT distal.  FOB  1/22  Thick secretions barrier to wean  1/24  Failed SBT, fever 1/25  Episode of mucus plugging after chest PT/wound care  2/03  Weaned 15 minutes  2/11  ATC  2/14  Trach changed to #4, mucus plugging >back to #5 XLT 2/15  Failed ATC with increased WOB 2/16  Failed ATC with increased WOB  2/21 back on pressure support  Consults:  PCCM Cardiology  General surgery 2/21 for sacral wound  Procedures:  ETT 1/3 >>1/6, 1/7 > 1/8> 1/9> 1/16 Trach 1/16 >>  PEG 1/28 >>     Significant Diagnostic Tests:  UDS 1/4 >negative  ECHO 1/4 >LVEF 20-25%, akinesis of the anteroseptal, anterolateral & apical myocardium, grade 1 diastolic dysfunction, small pericardial effusion without evidence of hemodynamic compromise. CT Chest / ABD / Pelvis 1/3 > airway thickening, motion artifact, CAD Echo 1/29> LVEF 60-65%, left ventricular hypertrophy, trivial pericardial effusion  Micro Data:  RVP 1/3 > positive for influenza A BCx2 1/3 >No growth UC 1/4 >No Growth Sputum Cx 1/7 > Normal respiratory flora BAL  >corynebacterium  Blood Cx 1/10 > neg Sputum 1/21 > abundant corynebacterium striatum Sputum 2/18 > Pseudomonas  Antimicrobials:  Vanco 1/3 x1;    Zosyn 1/30 x1  Tamiflu 1/3 >1/8 Vancomycin 1/10 > 1/12 Ceftaz 1/10 > 1/12; 1/21 >1/22 Emycin 1/12 > 1/22 Cefepime 1/23 > 1/24 Vanc 1/21> 1/28 Colistin 2/19 > Meropenem 2/19 > 2/20 Ceftaz 2/20 > 2/23  Interim history/subjective:  Weaned for press  Objective   Blood pressure 114/66, pulse 74, temperature 98.4 F (36.9 C), temperature source Axillary, resp. rate (!) 23, height 6' (1.829 m), weight 91.1 kg, SpO2 99 %.    Vent Mode: CPAP;PSV FiO2 (%):  [30 %] 30 % Set Rate:  [15 bmp] 15 bmp Vt Set:  [472 mL-500 mL] 500 mL PEEP:  [5 cmH20] 5 cmH20 Pressure Support:  [10 cmH20] 10 cmH20   Intake/Output Summary (Last 24 hours) at 01/18/2019 1357 Last data filed at 01/18/2019 0800 Gross per 24 hour  Intake 2759.04 ml  Output 1200 ml  Net 1559.04 ml   Filed Weights   01/16/19 0500 01/17/19 0355 01/18/19 0242  Weight: 91.1 kg 91 kg 91.1 kg   EXAM:   General:  In bed on vent HENT: NCAT trach in place PULM: CTA B, vent supported breathing CV: RRR, no mgr GI: BS+, soft, nontender MSK: normal bulk and tone Neuro: sleeping on vent, not following commands    LABS   PULMONARY No results for input(s): PHART, PCO2ART, PO2ART, HCO3, TCO2, O2SAT in the last 168  hours.  Invalid input(s): PCO2, PO2 CBC Recent Labs  Lab 01/14/19 0747 01/16/19 0338 01/18/19 0207  HGB 7.9* 8.4* 10.1*  HCT 26.0* 28.0* 33.7*  WBC 7.0 6.6 7.1  PLT 266 258 249   COAGULATION No results for input(s): INR in the last 168 hours.  CARDIAC   No results for input(s): TROPONINI in the last 168 hours. No results for input(s): PROBNP in the last 168 hours.  CHEMISTRY Recent Labs  Lab 01/12/19 0324 01/13/19 0837 01/16/19 0338 01/17/19 0539 01/18/19 0207  NA 144 143 138 136 135  K 3.9 3.8 4.5 4.3 4.5  CL 107 105 104 102 101  CO2 _0 GLUCOSE 135* 109* 187* 119* 113*  BUN 88* 85* 44* 35* 30*  CREATININE 1.29* 1.05 0.69 0.68 0.59*  CALCIUM 8.5* 8.2* 8.7* 8.6* 8.5*   Estimated Creatinine Clearance: 107.8 mL/min (A) (by C-G formula based on SCr of 0.59 mg/dL (L)).  LIVER No results for input(s): AST, ALT, ALKPHOS, BILITOT, PROT, ALBUMIN, INR in the last 168 hours. INFECTIOUS No results for input(s): LATICACIDVEN, PROCALCITON in the last 168 hours. ENDOCRINE CBG (last 3)  Recent Labs    01/17/19 2254 01/18/19 0735 01/18/19 1124  GLUCAP 84 134* 167*   IMAGING  No results found. Resolved Hospital Problem list   AKI Pneumonia Hypokalemia Hypernatremia Cardiomyopathy evidenced via 1/4 ECHO, resolved.  Assessment & Plan:   RESP Acute Hypoxemic Respiratory Failure s/p Tracheostomy In setting of Flu A, Corynebacterium PNA, now new resp cx with Pseudomonas and persistent copious secretions, improved since restarting abx. Tolerating pressure support well - stop IV ceftaz after today's dose - plan colistin inhaled 14 days total - Pulmonary toilette - wean off O2 for O2 saturation > 88% - attempt ATC today - resume full vent support tonight - continue metaneb daily - trach care per routine   CARDIAC Hx HTN, HLD, CAD BP stable overnight.    - continue ASA, lipitor, coreg - losartan d/c'd  ENDOCRINE DM - H/o poorly controlled, hx amputation. CBGs 100-200s. - SSI, levemir  NEURO Agitated Delirium, resolved  - seroquel, klonopin to continue  HEME Anemia - No overt signs of bleeding. - monitor CBC - transfuse per ICU guidelines - lovenox for DVT prophylaxis  DERM Sacral Decubitus Ulcer - wound care consult - general surgery consulted on 2/22> appreciate their input  FEN/GI Moderate Protein Calorie Malnutrition  - continue tube feeding - continue MVI, folate - PEG care per routine  Roselie Awkward, MD [facility] Pager: [phone] Cell: [phone] If no response, call  [phone]    01/18/2019 1:57 PM

## 2019-01-18 NOTE — Progress Notes (Signed)
Pt had a 4 beat run of V-tach. Asymptomatic. Elink notified. A.M. labs drawn. VSS. Will continue to monitor.

## 2019-01-19 ENCOUNTER — Inpatient Hospital Stay (HOSPITAL_COMMUNITY): Payer: Medicaid Other

## 2019-01-19 DIAGNOSIS — Z930 Tracheostomy status: Secondary | ICD-10-CM

## 2019-01-19 DIAGNOSIS — R0682 Tachypnea, not elsewhere classified: Secondary | ICD-10-CM

## 2019-01-19 DIAGNOSIS — Z720 Tobacco use: Secondary | ICD-10-CM

## 2019-01-19 DIAGNOSIS — R1310 Dysphagia, unspecified: Secondary | ICD-10-CM

## 2019-01-19 DIAGNOSIS — R000 Tachycardia, unspecified: Secondary | ICD-10-CM

## 2019-01-19 DIAGNOSIS — I10 Essential (primary) hypertension: Secondary | ICD-10-CM

## 2019-01-19 DIAGNOSIS — J151 Pneumonia due to Pseudomonas: Secondary | ICD-10-CM

## 2019-01-19 DIAGNOSIS — R451 Restlessness and agitation: Secondary | ICD-10-CM

## 2019-01-19 DIAGNOSIS — E119 Type 2 diabetes mellitus without complications: Secondary | ICD-10-CM

## 2019-01-19 DIAGNOSIS — J189 Pneumonia, unspecified organism: Secondary | ICD-10-CM

## 2019-01-19 DIAGNOSIS — Z89512 Acquired absence of left leg below knee: Secondary | ICD-10-CM

## 2019-01-19 DIAGNOSIS — R52 Pain, unspecified: Secondary | ICD-10-CM

## 2019-01-19 DIAGNOSIS — D62 Acute posthemorrhagic anemia: Secondary | ICD-10-CM

## 2019-01-19 LAB — GLUCOSE, CAPILLARY
Glucose-Capillary: 129 mg/dL — ABNORMAL HIGH (ref 70–99)
Glucose-Capillary: 136 mg/dL — ABNORMAL HIGH (ref 70–99)
Glucose-Capillary: 153 mg/dL — ABNORMAL HIGH (ref 70–99)
Glucose-Capillary: 160 mg/dL — ABNORMAL HIGH (ref 70–99)
Glucose-Capillary: 177 mg/dL — ABNORMAL HIGH (ref 70–99)

## 2019-01-19 LAB — CBC WITH DIFFERENTIAL/PLATELET
Abs Immature Granulocytes: 0.11 10*3/uL — ABNORMAL HIGH (ref 0.00–0.07)
Basophils Absolute: 0 10*3/uL (ref 0.0–0.1)
Basophils Relative: 0 %
Eosinophils Absolute: 0.2 10*3/uL (ref 0.0–0.5)
Eosinophils Relative: 3 %
HEMATOCRIT: 32.9 % — AB (ref 39.0–52.0)
Hemoglobin: 9.9 g/dL — ABNORMAL LOW (ref 13.0–17.0)
Immature Granulocytes: 2 %
LYMPHS ABS: 1.9 10*3/uL (ref 0.7–4.0)
Lymphocytes Relative: 29 %
MCH: 26.9 pg (ref 26.0–34.0)
MCHC: 30.1 g/dL (ref 30.0–36.0)
MCV: 89.4 fL (ref 80.0–100.0)
MONO ABS: 0.5 10*3/uL (ref 0.1–1.0)
Monocytes Relative: 7 %
Neutro Abs: 3.8 10*3/uL (ref 1.7–7.7)
Neutrophils Relative %: 59 %
Platelets: 264 10*3/uL (ref 150–400)
RBC: 3.68 MIL/uL — ABNORMAL LOW (ref 4.22–5.81)
RDW: 16.2 % — ABNORMAL HIGH (ref 11.5–15.5)
WBC: 6.5 10*3/uL (ref 4.0–10.5)
nRBC: 0 % (ref 0.0–0.2)

## 2019-01-19 LAB — BASIC METABOLIC PANEL
Anion gap: 8 (ref 5–15)
BUN: 28 mg/dL — ABNORMAL HIGH (ref 6–20)
CO2: 28 mmol/L (ref 22–32)
Calcium: 8.6 mg/dL — ABNORMAL LOW (ref 8.9–10.3)
Chloride: 100 mmol/L (ref 98–111)
Creatinine, Ser: 0.61 mg/dL (ref 0.61–1.24)
GFR calc Af Amer: >60 mL/min (ref >60)
GFR calc non Af Amer: >60 mL/min (ref >60)
GLUCOSE: 183 mg/dL — AB (ref 70–99)
Potassium: 4.3 mmol/L (ref 3.5–5.1)
Sodium: 136 mmol/L (ref 135–145)

## 2019-01-19 MED ORDER — SODIUM CHLORIDE 0.9 % IV SOLN
2.0000 g | Freq: Three times a day (TID) | INTRAVENOUS | Status: AC
  Administered 2019-01-19 – 2019-01-24 (×16): 2 g via INTRAVENOUS
  Filled 2019-01-19 (×18): qty 2

## 2019-01-19 MED ORDER — FREE WATER
200.0000 mL | Freq: Three times a day (TID) | Status: DC
  Administered 2019-01-19 – 2019-01-20 (×3): 200 mL

## 2019-01-19 NOTE — Consult Note (Signed)
Physical Medicine and Rehabilitation Consult Reason for Consult:  Decreased functional mobility Referring Physician:   Family medicine   HPI: Antonio [name]. is a 63 y.o.right handed male wwith history of diabetes mellitus, hypertension, CAD, tobacco abuse, left BKA, right toe amputation. Per chart review, patientlives in basement of mother's house. Limited gait prior to admission with prosthesis using a assistive device. Presented 11/28/2018 with increasing shortness of breath. Found to have influenza A causing respiratory failure and cardiogenic shock. Requiring intubation for acute hypoxemic respiratory failure due to decreased oxygen saturations. CT of the chest abdomen and pelvis showed airway thickening present.echocardiogram with ejection fraction of 25 % with systolic function severely reduced. Patient with prolonged mechanical ventilation underwent tracheostomy 12/11/2018 per Dr. Molli Knock as well as placement of PEG tube 12/23/2018 for nutritional support per interventional radiology. Patient failed multiple attempts at downsizing trach with mucus plugging. She has been treated for HCAP as well as new respiratory cultures with Pseudomonas and persistent copious secretions maintained on antibiotic therapy. Speech therapy has been working with Passy-Muir valve. Subcutaneous Lovenox for DVT prophylaxis. Klonopin and Seroquel ongoing for bouts of delirium. Wound care nurse follow-up for right gluteal unstageable pressure injury with wound care as directed.Therapy evaluations completed with recommendations of physical medicine rehabilitation consult.   Review of Systems  Unable to perform ROS: Acuity of condition   Past Medical History:  Diagnosis Date  . Coronary artery disease   . Diabetes mellitus without complication (HCC)   . Hyperlipidemia   . Hypertension   . Osteomyelitis (HCC) 03/25/2017   RT FOOT   Past Surgical History:  Procedure Laterality Date  . AMPUTATION Right  03/27/2017   Procedure: 1st Ray Amputation Right Foot;  Surgeon: Nadara Mustard, MD;  [facility];  Service: Orthopedics;  Laterality: Right;  . APPENDECTOMY    . BELOW KNEE LEG AMPUTATION Left   . CARDIAC CATHETERIZATION    . CORONARY STENT INTERVENTION  2005  . Great toe amputation right.    . I&D EXTREMITY Left 12/30/2015   Procedure: IRRIGATION AND DEBRIDEMENT LEFT FOOT, TRANSMETATARSAL AMPUTATION WITH APPLICATION OF ANTIBIOTIC BEADS AND WOUND VAC;  Surgeon: Nadara Mustard, MD;  Location: [facility] OR;  Service: Orthopedics;  Laterality: Left;  . IR GASTROSTOMY TUBE MOD SED  12/23/2018  . TONSILLECTOMY    . TRACHEOSTOMY  11/2018   Family History  Problem Relation Age of Onset  . Diabetes Mother   . Emphysema Father   . CAD Sister 1       Died of MI after flu   Social History:  reports that he quit smoking about 7 weeks ago. His smoking use included cigarettes. He has a 70.00 pack-year smoking history. He has never used smokeless tobacco. He reports current alcohol use. He reports that he does not use drugs. Allergies:  Allergies  Allergen Reactions  . Iodinated Diagnostic Agents Hives, Rash and Other (See Comments)    Blisters Staph Blisters / staph  . Penicillins Other (See Comments)    UNSPECIFIED REACTION  Has patient had a PCN reaction causing immediate rash, facial/tongue/throat swelling, SOB or lightheadedness with hypotension: No Has patient had a PCN reaction causing severe rash involving mucus membranes or skin necrosis: No Has patient had a PCN reaction that required hospitalization: No Has patient had a PCN reaction occurring within the last 10 years: No If all of the above answers are "NO", then may proceed with Cephalosporin use.   Medications Prior to  Admission  Medication Sig Dispense Refill  . aspirin EC 81 MG tablet Take 81 mg by mouth daily.    Marland Kitchen atorvastatin (LIPITOR) 40 MG tablet Take 1 tablet (40 mg total) by mouth daily at 6 PM. 30 tablet 0  . glipiZIDE  (GLUCOTROL) 10 MG tablet Take 10 mg by mouth 2 (two) times daily before a meal.    . Insulin Degludec (TRESIBA) 100 UNIT/ML SOLN Inject 70 Units into the skin at bedtime.     . insulin glargine (LANTUS) 100 UNIT/ML injection Inject 0.05 mLs (5 Units total) into the skin at bedtime. 10 mL 11  . insulin regular (NOVOLIN R,HUMULIN R) 100 units/mL injection Inject 2-4 Units into the skin as needed for high blood sugar. Only of over 150 mg    . lisinopril (PRINIVIL,ZESTRIL) 5 MG tablet Take 5 mg by mouth 2 (two) times daily.    . metoprolol tartrate (LOPRESSOR) 25 MG tablet Take 25 mg by mouth 2 (two) times daily.    . Multiple Vitamin (MULTIVITAMIN) tablet Take 1 tablet by mouth daily.    . tapentadol HCl (NUCYNTA) 75 MG tablet Take 75 mg by mouth 4 (four) times daily.       Home: Home Living Family/patient expects to be discharged to:: Skilled nursing facility Living Arrangements: Children, Parent Available Help at Discharge: Available PRN/intermittently, Family Type of Home: House Home Access: Level entry Home Layout: Multi-level, Able to live on main level with bedroom/bathroom Bathroom Shower/Tub: Health visitor: Standard Home Equipment: Information systems manager - built in, Higher education careers adviser held shower head, Art gallery manager, Environmental consultant - 4 wheels, [place] - single point Additional Comments: pt lives in basement of mother's house, parents on main level and son upstairs  Functional History: Prior Function Level of Independence: Needs assistance Gait / Transfers Assistance Needed: limited gait due to prosthesis and edema with rollator, transfers to scooter ADL's / Homemaking Assistance Needed: Performed BADLs. Family assisted with IADLs.  Functional Status:  Mobility: Bed Mobility Overal bed mobility: Needs Assistance Bed Mobility: Supine to Sit Rolling: Mod assist Sidelying to sit: Mod assist Supine to sit: Min assist, HOB elevated Sit to supine: +2 for physical assistance, Mod assist, HOB  elevated Sit to sidelying: Min assist, +2 for physical assistance, +2 for safety/equipment General bed mobility comments: Min hand held assist to come to sitting EOB.  HOB ~30 degrees.   Transfers Overall transfer level: Needs assistance Equipment used: Rolling walker (2 wheeled) Transfer via Lift Equipment: Stedy Transfers: Sit to/from [employer] to Stand: Min assist, +2 physical assistance Stand pivot transfers: Min assist, +2 physical assistance  Lateral/Scoot Transfers: Min assist, +2 physical assistance General transfer comment: Pt stood EOB with RW (prosthesis donned EOB by pt), min assist to help power up and stabilize walker.  Min assist to stand and take pivotal steps with the RW to the recliner chair.   Ambulation/Gait Ambulation/Gait assistance: Mod assist, +2 safety/equipment Gait Distance (Feet): 50 Feet Assistive device: Rolling walker (2 wheeled) Gait Pattern/deviations: Step-to pattern, Narrow base of support, Trunk flexed General Gait Details: verbal cues to stand up right/decrease bilat UE WBing, pt with increased productive cough and had episode of stool incontinence requirign pt to sit in chair, during ambulation pt with decresaed L knee extension and amb with feet very close together Gait velocity: decreased Gait velocity interpretation: <1.8 ft/sec, indicate of risk for recurrent falls    ADL: ADL Overall ADL's : Needs assistance/impaired Eating/Feeding: NPO Grooming: Wash/dry hands, Set up, Sitting Grooming Details (indicate  cue type and reason): using L UE to wash face and B hands with setup assist, encouraged forward reach in chair but patient declining  Upper Body Bathing: Set up, Sitting Upper Body Bathing Details (indicate cue type and reason): washing UEs seated in recliner, pt requires encouragement to perform on his own as he initially requests therapist to complete Lower Body Bathing: Maximal assistance, Sitting/lateral leans Lower Body Bathing Details  (indicate cue type and reason): washing B thighs with increased time and effort, tactile and verbal cueing to complete task, limited distally; overall requires max assist  Upper Body Dressing : Maximal assistance, Bed level Upper Body Dressing Details (indicate cue type and reason): changing gown, limited assist from patient  Lower Body Dressing: Moderate assistance, Sitting/lateral leans Lower Body Dressing Details (indicate cue type and reason): max for R shoe and sock, min guard for L LE Toilet Transfer: Moderate assistance, +2 for safety/equipment, Stand-pivot, BSC Toilet Transfer Details (indicate cue type and reason): Sit>stand +1 Mod A Toileting- Clothing Manipulation and Hygiene: Total assistance, Sit to/from stand, +2 for safety/equipment Toileting - Clothing Manipulation Details (indicate cue type and reason): Mod A sit<>stand Functional mobility during ADLs: +2 for physical assistance, Minimal assistance, Rolling walker(took several steps along EOB) General ADL Comments: total A to don right sock and shoe as well as left prosthetic sleeve and prosthesis; total to doff right sock and shoe and Max A to doff right prosthetic sleeve and prothesis while seated EOB.  Cognition: Cognition Overall Cognitive Status: Difficult to assess Orientation Level: Intubated/Tracheostomy - Unable to assess Cognition Arousal/Alertness: Awake/alert Behavior During Therapy: Flat affect Overall Cognitive Status: Difficult to assess Area of Impairment: Problem solving, Awareness Orientation Level: Disoriented to, Time Current Attention Level: Selective Following Commands: Follows one step commands consistently Safety/Judgement: Decreased awareness of safety, Decreased awareness of deficits Awareness: Emergent Problem Solving: Slow processing, Decreased initiation, Difficulty sequencing, Requires verbal cues, Requires tactile cues General Comments: Pt is a bit crass, reports "they told me today that I  would die in this hospital" asked RN and she said he mis-interpreted what the MD told him today. PMV donned which helped with communication.  Difficult to assess due to: Tracheostomy  Blood pressure (!) 100/57, pulse 80, temperature 97.9 F (36.6 C), temperature source Oral, resp. rate (!) 25, height 6' (1.829 m), weight 91.2 kg, SpO2 97 %. Physical Exam  Vitals reviewed. Constitutional: He appears well-developed and well-nourished.  HENT:  Head: Normocephalic and atraumatic.  Eyes: EOM are normal. Right eye exhibits no discharge. Left eye exhibits no discharge.  Neck:  Trach tube in place  Cardiovascular: Normal rate and regular rhythm.  Respiratory:  Limited inspiratory effort  Upper airway sounds  GI: Soft. Bowel sounds are normal.  PEG tube in place  Musculoskeletal:     Comments: No edema or tenderness in extremities Left BKA Right lower extremity first digit amputation with pes planus  Neurological: He is alert.  Patient is alert.  Makes eye contact with examiner.  He attempts to mouth some words. Motor: Bilateral upper extremities, right lower extremity: 1+/5 proximal distal Left lower extremity: Hip flexion 4+/5  Skin: Skin is warm and dry.  Left BKA healed  Psychiatric: He has a normal mood and affect. His behavior is normal.    Results for orders placed or performed during the hospital encounter of 11/28/18 (from the past 24 hour(s))  Glucose, capillary     Status: None   Collection Time: 01/18/19  3:58 PM  Result Value Ref  Range   Glucose-Capillary 91 70 - 99 mg/dL  Glucose, capillary     Status: Abnormal   Collection Time: 01/18/19  7:51 PM  Result Value Ref Range   Glucose-Capillary 144 (H) 70 - 99 mg/dL  Glucose, capillary     Status: Abnormal   Collection Time: 01/18/19 11:19 PM  Result Value Ref Range   Glucose-Capillary 158 (H) 70 - 99 mg/dL  Basic metabolic panel     Status: Abnormal   Collection Time: 01/19/19  3:03 AM  Result Value Ref Range    Sodium 136 135 - 145 mmol/L   Potassium 4.3 3.5 - 5.1 mmol/L   Chloride 100 98 - 111 mmol/L   CO2 28 22 - 32 mmol/L   Glucose, Bld 183 (H) 70 - 99 mg/dL   BUN 28 (H) 6 - 20 mg/dL   Creatinine, Ser 4.33 0.61 - 1.24 mg/dL   Calcium 8.6 (L) 8.9 - 10.3 mg/dL   GFR calc non Af Amer >60 >60 mL/min   GFR calc Af Amer >60 >60 mL/min   Anion gap 8 5 - 15  CBC with Differential/Platelet     Status: Abnormal   Collection Time: 01/19/19  3:03 AM  Result Value Ref Range   WBC 6.5 4.0 - 10.5 K/uL   RBC 3.68 (L) 4.22 - 5.81 MIL/uL   Hemoglobin 9.9 (L) 13.0 - 17.0 g/dL   HCT 29.5 (L) 18.8 - 41.6 %   MCV 89.4 80.0 - 100.0 fL   MCH 26.9 26.0 - 34.0 pg   MCHC 30.1 30.0 - 36.0 g/dL   RDW 60.6 (H) 30.1 - 60.1 %   Platelets 264 150 - 400 K/uL   nRBC 0.0 0.0 - 0.2 %   Neutrophils Relative % 59 %   Neutro Abs 3.8 1.7 - 7.7 K/uL   Lymphocytes Relative 29 %   Lymphs Abs 1.9 0.7 - 4.0 K/uL   Monocytes Relative 7 %   Monocytes Absolute 0.5 0.1 - 1.0 K/uL   Eosinophils Relative 3 %   Eosinophils Absolute 0.2 0.0 - 0.5 K/uL   Basophils Relative 0 %   Basophils Absolute 0.0 0.0 - 0.1 K/uL   Immature Granulocytes 2 %   Abs Immature Granulocytes 0.11 (H) 0.00 - 0.07 K/uL  Glucose, capillary     Status: Abnormal   Collection Time: 01/19/19  7:37 AM  Result Value Ref Range   Glucose-Capillary 177 (H) 70 - 99 mg/dL  Glucose, capillary     Status: Abnormal   Collection Time: 01/19/19 11:20 AM  Result Value Ref Range   Glucose-Capillary 153 (H) 70 - 99 mg/dL   Dg Chest Port 1 View  Result Date: 01/19/2019 CLINICAL DATA:  Acute respiratory failure EXAM: PORTABLE CHEST 1 VIEW COMPARISON:  01/15/2019 FINDINGS: Cardiac shadow is stable. Left lung remains clear. Endotracheal tube is noted in satisfactory position. Right basilar atelectasis is noted increased from the prior exam. No pneumothorax is seen. No acute bony abnormality is noted. IMPRESSION: Increasing right basilar atelectasis. Electronically Signed    By: Alcide Clever M.D.   On: 01/19/2019 07:23   Assessment/Plan: Diagnosis: Debility Labs independently reviewed.  Records reviewed and summated above.  1. Does the need for close, 24 hr/day medical supervision in concert with the patient's rehab needs make it unreasonable for this patient to be served in a less intensive setting? Yes  2. Co-Morbidities requiring supervision/potential complications: diabetes mellitus (Monitor in accordance with exercise and adjust meds as necessary), HTN (monitor and  provide prns in accordance with increased physical exertion and pain), CAD, tobacco abuse (counsel), left BKA, right toe amputation, tachypnea (monitor RR and O2 Sats with increased physical exertion), pain/agitation (DC IV fentanyl when appropriate), ABLA (repeat labs, consider transfusion if necessary to ensure appropriate perfusion for increased activity tolerance), HCAP (DC IV ABX when appropriate) 3. Due to safety, skin/wound care, disease management, pain management and patient education, does the patient require 24 hr/day rehab nursing? Yes 4. Does the patient require coordinated care of a physician, rehab nurse, PT (1-2 hrs/day, 5 days/week), OT (1-2 hrs/day, 5 days/week) and SLP (1-2 hrs/day, 5 days/week) to address physical and functional deficits in the context of the above medical diagnosis(es)? Yes Addressing deficits in the following areas: balance, endurance, locomotion, strength, transferring, bathing, dressing, toileting, speech, swallowing and psychosocial support 5. Can the patient actively participate in an intensive therapy program of at least 3 hrs of therapy per day at least 5 days per week? Potentially 6. The potential for patient to make measurable gains while on inpatient rehab is excellent 7. Anticipated functional outcomes upon discharge from inpatient rehab are supervision and min assist  with PT, supervision and min assist with OT, modified independent and supervision with  SLP. 8. Estimated rehab length of stay to reach the above functional goals is: 16-19 days. 9. Anticipated D/C setting: Home 10. Anticipated post D/C treatments: HH therapy and Home excercise program 11. Overall Rehab/Functional Prognosis: good  RECOMMENDATIONS: This patient's condition is appropriate for continued rehabilitative care in the following setting: CIR when medically stable and able to tolerate 3 hours of therapy/day. Patient has agreed to participate in recommended program. Yes Note that insurance prior authorization may be required for reimbursement for recommended care.  Comment: Rehab Admissions Coordinator to follow up.   I have personally performed a face to face diagnostic evaluation, including, but not limited to relevant history and physical exam findings, of this patient and developed relevant assessment and plan.  Additionally, I have reviewed and concur with the physician assistant's documentation above.   Maryla Morrow, MD, ABPMR Mcarthur Rossetti Angiulli, PA-C 01/19/2019

## 2019-01-19 NOTE — Clinical Social Work Note (Signed)
Clinical Social Work Assessment  Patient Details  Name: Antonio [name]. MRN: [id number] Date of Birth: 05/17/1956  Date of referral:  01/19/19               Reason for consult:  Facility Placement                Permission sought to share information with:  Family Supports Permission granted to share information::  Yes, Verbal Permission Granted  Name::     Scientist, physiological::  family  Relationship::  son   Contact Information:     Housing/Transportation Living arrangements for the past 2 months:    Source of Information:    Patient Interpreter Needed:    Criminal Activity/Legal Involvement Pertinent to Current Situation/Hospitalization:    Significant Relationships:    Lives with:    Do you feel safe going back to the place where you live?    Need for family participation in patient care:     Care giving concerns:  CSW consulted as pt may be in need of vent/snf placement at the time of discharge. CSW spoke with pt and family at bedside to discuss thi sin depth.    Social Worker assessment / plan:  CSW spoke with pt, mother, and step father at bedside. CSW expressed the reason for referral and offered to explain to pt and family options. CSW began conversation by explaining to family that pt is on vent and if pt remains on vent then pt would need a facility that can meet his needs. Pt's mother was very involved in conversation and suggested that she would prefer for pt to remain in [place] if possible. CSW advised pt and other family at bedside that if pt was in need of vent/snf then pt would need to send information to all facilities to see which can take pt.   Mother expressed a desire for pt to not be placed at [facility] where pt expressed a desire to be placed in [place]. CSW now aware that pt may be appropriate for CIR if able to stay off vent. CSW will continue to follow at this time for further needs.   Employment status:  Unemployed   Health and safety inspector:  Medicaid In [place] [facility]  Recommendations:  Skilled Holiday representative, Inpatient Rehab Consult Information / Referral to community resources:  Skilled Nursing Facility  Patient/Family's Response to care:  Pt appeared to be understanding of plan of care at this time.  Patient/Family's Understanding of and Emotional Response to Diagnosis, Current Treatment, and Prognosis:  No further concerns or questions have been presented to CSW at this time. CSW will continue to follow for further discharge needs.   Emotional Assessment Appearance:  Appears stated age Attitude/Demeanor/Rapport:  Unable to Assess(pt was on trach but was able to listen in on conversation. ) Affect (typically observed):  Sad, Pleasant Orientation:  (follows commands.) Alcohol / Substance use:  Not Applicable Psych involvement (Current and /or in the community):  No (Comment)  Discharge Needs  Concerns to be addressed:  Care Coordination Readmission within the last 30 days:  No Current discharge risk:  Dependent with Mobility, Physical Impairment, Chronically ill Barriers to Discharge:  Continued Medical Work up   [employer], LCSWA 01/19/2019, 2:10 PM

## 2019-01-19 NOTE — Progress Notes (Addendum)
11:58am-CSW spoke with pt and family at bedside. CSW advised them that per report pt may benefit from vent/snf placement. CSW explained the process to pt and mother at bedside. Mother expressed that she isn't interested in pt being out of state but would rather him not be placed at [facility]. CSW advised mother that CSW isn't allowed to say where pt would be placed but would gladly try and keep pt in [place] I facilities are abe to meet pt needs. CSW was then information by mother to speak with pt's son [name]. CSW spoke with [name] and he expressed that he would speak with pt this evening to see if pt wanted to be placed or if pt wanted to just transition.    CSW understanding that pt may be in need of vent/snf placement if unable to wean from vent. CSW went to discuss this with pt and pt asked that CSW speak with family. Pt expressed that family may be here later today. CSW to check back in as schedule permits to complete assessment and to discuss vent/snf placement needs.     Antonio [name], MSW, LCSW-A Emergency Department Clinical Social Worker [phone]

## 2019-01-19 NOTE — Progress Notes (Signed)
Rehab Admissions Coordinator Note:  Patient was screened by Trish Mage for appropriateness for an Inpatient Acute Rehab Consult.  At this time, we are recommending Inpatient Rehab consult.  Lelon Frohlich M 01/19/2019, 2:45 PM  I can be reached at [phone].

## 2019-01-19 NOTE — Evaluation (Signed)
Passy-Muir Speaking Valve - Evaluation Patient Details  Name: Antonio [name]. MRN: [id number] Date of Birth: 07-09-1956  Today's Date: 01/19/2019 Time: 1030-1048 SLP Time Calculation (min) (ACUTE ONLY): 18 min  Past Medical History:  Past Medical History:  Diagnosis Date  . Coronary artery disease   . Diabetes mellitus without complication (HCC)   . Hyperlipidemia   . Hypertension   . Osteomyelitis (HCC) 03/25/2017   RT FOOT   Past Surgical History:  Past Surgical History:  Procedure Laterality Date  . AMPUTATION Right 03/27/2017   Procedure: 1st Ray Amputation Right Foot;  Surgeon: Nadara Mustard, MD;  Location: [facility] OR;  Service: Orthopedics;  Laterality: Right;  . APPENDECTOMY    . BELOW KNEE LEG AMPUTATION Left   . CARDIAC CATHETERIZATION    . CORONARY STENT INTERVENTION  2005  . Great toe amputation right.    . I&D EXTREMITY Left 12/30/2015   Procedure: IRRIGATION AND DEBRIDEMENT LEFT FOOT, TRANSMETATARSAL AMPUTATION WITH APPLICATION OF ANTIBIOTIC BEADS AND WOUND VAC;  Surgeon: Nadara Mustard, MD;  Location: [facility] OR;  Service: Orthopedics;  Laterality: Left;  . IR GASTROSTOMY TUBE MOD SED  12/23/2018  . TONSILLECTOMY    . TRACHEOSTOMY  11/2018   HPI:  63 year old male coming in with fever cough chest pain and shortness of breath was found to have influenza A. PMH: diabetes mellitus, hypertension.  Intubated multiple times 1/3 to 1/16 with trach placed 1/16.  Currently with size 6 Cuffless trach with plans to downsize to #4 and work toward decannulation.    Assessment / Plan / Recommendation Clinical Impression  Pt demonstrates adequate redirection of air to upper airway with finger occlusion and placement of PMSV. Cuff delation tolerated for 2 minutes with no change in vitals. Pt noted to have strong cough and achieve some phonation without finger occlusion or placement of PMSV. With valve placement he is able to successfully cough up secretions to anterior oral cavity to be  suctioned. Speech intelligbility generated at the word and phrase level (50-75% accurate) was severely impacted by reduced breath support. Pt required min-mod verbal cues to generate enough breath support to increase vocal intensity, which he was successful with but reported that his throat hurt while doing so. Able to tolerate placement for 8 minutes with no change in vitals, signs of back pressure or complaints of respiratory distress. Recommend pt wear PMSV intermittently with staff and during all therapies with full supervision. Will f/u to provide education and strategies for PMSV use and maintenance.    SLP Visit Diagnosis: Aphonia (R49.1)    SLP Assessment  Patient needs continued Speech Lanaguage Pathology Services    Follow Up Recommendations  24 hour supervision/assistance;Skilled Nursing facility    Frequency and Duration min 2x/week  2 weeks    PMSV Trial PMSV was placed for: (8 minutes) Able to redirect subglottic air through upper airway: Yes Able to Attain Phonation: Yes Voice Quality: Wet;Breathy;Hoarse Able to Expectorate Secretions: Yes Level of Secretion Expectoration with PMSV: Oral;Tracheal Breath Support for Phonation: Severely decreased Intelligibility: Intelligibility reduced Word: 50-74% accurate Phrase: 25-49% accurate Respirations During Trial: (23) SpO2 During Trial: (93) Behavior: Alert;Cooperative;Good eye contact;Responsive to questions   Tracheostomy Tube       Vent Dependency  FiO2 (%): 28 %    Cuff Deflation Trial  GO Tolerated Cuff Deflation: Yes Length of Time for Cuff Deflation Trial: (10) Behavior: Alert;Cooperative;Responsive to questions        Gardiner Ramus, Student SLP 01/19/2019, 11:25 AM

## 2019-01-19 NOTE — Progress Notes (Addendum)
Physical Therapy Wound Treatment Patient Details  Name: Davaughn Hillyard. MRN: [id number] Date of Birth: Jan 31, 1956  Today's Date: 01/19/2019 Time: 7741-2878 Time Calculation (min): 35 min  Subjective  Subjective: Pt asking how is it looking Patient and Family Stated Goals: Pt on trach collar Date of Onset: (unknown) Prior Treatments: dressing changes  Pain Score:Pt with fentanyl patch. Grimacing with pulsatile lavage  Wound Assessment  Pressure Injury 12/01/18 Unstageable - Full thickness tissue loss in which the base of the ulcer is covered by slough (yellow, tan, gray, green or brown) and/or eschar (tan, brown or black) in the wound bed. sacrum (Active)  Dressing Type Barrier Film (skin prep);ABD;Moist to moist;Gauze (Comment);Other (Comment) 01/19/2019 10:22 AM  Dressing Clean;Dry;Intact;Changed 01/19/2019 10:22 AM  Dressing Change Frequency Daily 01/19/2019 10:22 AM  State of Healing Early/partial granulation 01/19/2019 10:22 AM  Site / Wound Assessment Red;Yellow;Pink 01/19/2019 10:22 AM  % Wound base Red or Granulating 70% 01/19/2019 10:22 AM  % Wound base Yellow/Fibrinous Exudate 30% 01/19/2019 10:22 AM  % Wound base Black/Eschar 0% 01/19/2019 10:22 AM  % Wound base Other/Granulation Tissue (Comment) 0% 01/19/2019 10:22 AM  Peri-wound Assessment Erythema (blanchable) 01/19/2019 10:22 AM  Wound Length (cm) 9 cm 01/14/2019 10:25 AM  Wound Width (cm) 7.5 cm 01/14/2019 10:25 AM  Wound Depth (cm) 0.1 cm 01/14/2019 10:25 AM  Wound Surface Area (cm^2) 67.5 cm^2 01/14/2019 10:25 AM  Wound Volume (cm^3) 6.75 cm^3 01/14/2019 10:25 AM  Tunneling (cm) 0 12/31/2018 10:28 AM  Margins Unattached edges (unapproximated) 01/19/2019 10:22 AM  Drainage Amount Minimal 01/19/2019 10:22 AM  Drainage Description Purulent;Green 01/19/2019 10:22 AM  Treatment Debridement (Selective);Hydrotherapy (Pulse lavage);Packing (Saline gauze) 01/19/2019 10:22 AM   Santyl applied to wound bed prior to applying dressing.     Hydrotherapy Pulsed lavage therapy - wound location: sacrum Pulsed Lavage with Suction (psi): 8 psi(8-12) Pulsed Lavage with Suction - Normal Saline Used: 1000 mL Pulsed Lavage Tip: Tip with splash shield Selective Debridement Selective Debridement - Location: sacrum Selective Debridement - Tools Used: Forceps;Scissors Selective Debridement - Tissue Removed: yellow necrotic tissue   Wound Assessment and Plan  Wound Therapy - Assess/Plan/Recommendations Wound Therapy - Clinical Statement: Depth of wound on superior portion continues to increase as necrotic tissue removed. Some blue green drainage noted on old dressing. Continue hydrotherapy for removal of necrotic tissue.  Wound Therapy - Functional Problem List: decr mobility Factors Delaying/Impairing Wound Healing: Diabetes Mellitus;Immobility;Multiple medical problems Hydrotherapy Plan: Debridement;Dressing change;Patient/family education;Pulsatile lavage with suction Wound Therapy - Frequency: 3X / week Wound Therapy - Follow Up Recommendations: Skilled nursing facility Wound Plan: see above  Wound Therapy Goals- Improve the function of patient's integumentary system by progressing the wound(s) through the phases of wound healing (inflammation - proliferation - remodeling) by: Decrease Necrotic Tissue to: 20 Decrease Necrotic Tissue - Progress: Progressing toward goal Increase Granulation Tissue to: 80 Increase Granulation Tissue - Progress: Progressing toward goal  Goals will be updated until maximal potential achieved or discharge criteria met.  Discharge criteria: when goals achieved, discharge from hospital, MD decision/surgical intervention, no progress towards goals, refusal/missing three consecutive treatments without notification or medical reason.  GP     Shary Decamp [facility] 01/19/2019, 10:27 AM [place] Pager [phone] Office [phone]

## 2019-01-19 NOTE — Progress Notes (Signed)
NAME:  Antonio [name]., MRN:  [id number], DOB:  12-Jan-1956, LOS: 71 ADMISSION DATE:  11/28/2018, CONSULTATION DATE:  1/3 REFERRING MD:  EDP, CHIEF COMPLAINT:  Dyspnea   Brief History   63 y/o M admitted 1/3 with influenza A causing respiratory failure and cardiogenic shock. Prolonged mechanical ventilation, tracheostomy placed on January 16.  Developed HCAP.  Difficult wean, has failed multiple attempts at downsize with mucus plugging. Repeat resp cx 2/18 Pseduomonas.  Past Medical History  DM2, CAD, HTN, HLD, tobacco use  Significant Hospital Events   1/03  Admit, Influenza A positive 1/06  Self extubated  1/07  Re-intubated  1/08  Self-extubated  1/09  Reintubated, mucus plugging on FOB 1/10  Fever to 103, abx restarted  1/16  Diuresing, increased WOB with weaning efforts.  Trach.  1/20  Failed wean, anxiety / secretions 1/21  Trach revision to XLT distal.  FOB  1/22  Thick secretions barrier to wean  1/24  Failed SBT, fever 1/25  Episode of mucus plugging after chest PT/wound care  2/03  Weaned 15 minutes  2/11  ATC  2/14  Trach changed to #4, mucus plugging >back to #5 XLT 2/15  Failed ATC with increased WOB 2/16  Failed ATC with increased WOB  2/21 back on pressure support  Consults:  PCCM Cardiology  General surgery 2/21 for sacral wound  Procedures:  ETT 1/3 >>1/6, 1/7 > 1/8> 1/9> 1/16 Trach 1/16 >>  PEG 1/28 >>     Significant Diagnostic Tests:  UDS 1/4 >negative  ECHO 1/4 >LVEF 20-25%, akinesis of the anteroseptal, anterolateral & apical myocardium, grade 1 diastolic dysfunction, small pericardial effusion without evidence of hemodynamic compromise. CT Chest / ABD / Pelvis 1/3 > airway thickening, motion artifact, CAD Echo 1/29> LVEF 60-65%, left ventricular hypertrophy, trivial pericardial effusion  Micro Data:  RVP 1/3 > positive for influenza A BCx2 1/3 >No growth UC 1/4 >No Growth Sputum Cx 1/7 > Normal respiratory flora BAL  >corynebacterium  Blood Cx 1/10 > neg Sputum 1/21 > abundant corynebacterium striatum Sputum 2/18 > Pseudomonas  Antimicrobials:  Vanco 1/3 x1;    Zosyn 1/30 x1  Tamiflu 1/3 >1/8 Vancomycin 1/10 > 1/12 Ceftaz 1/10 > 1/12; 1/21 >1/22 Emycin 1/12 > 1/22 Cefepime 1/23 > 1/24 Vanc 1/21> 1/28 Colistin 2/19 > Meropenem 2/19 > 2/20 Ceftaz 2/20 > 2/23  Interim history/subjective:  Weaned yesterday, required full vent support overnight. On 5L via trach this am.  Objective   Blood pressure 116/67, pulse 75, temperature 98.5 F (36.9 C), temperature source Oral, resp. rate 20, height 6' (1.829 m), weight 91.2 kg, SpO2 99 %.    Vent Mode: PRVC FiO2 (%):  [28 %-30 %] 28 % Set Rate:  [15 bmp] 15 bmp Vt Set:  [500 mL] 500 mL PEEP:  [5 cmH20] 5 cmH20 Pressure Support:  [10 cmH20] 10 cmH20 Plateau Pressure:  [14 cmH20-16 cmH20] 14 cmH20   Intake/Output Summary (Last 24 hours) at 01/19/2019 0848 Last data filed at 01/19/2019 0600 Gross per 24 hour  Intake 2964.87 ml  Output 1850 ml  Net 1114.87 ml   Filed Weights   01/17/19 0355 01/18/19 0242 01/19/19 0317  Weight: 91 kg 91.1 kg 91.2 kg   EXAM:   General: pleasant male, lying in bed HENT: trach in place with minimal secretions, MMM PULM: CTAB CV: RRR, no mrg GI: soft, TTP diffusely, non distended, +BS MSK: normal bulk and tone, L BKA. Neuro: alert, converses appropriately  LABS  PULMONARY No results for input(s): PHART, PCO2ART, PO2ART, HCO3, TCO2, O2SAT in the last 168 hours.  Invalid input(s): PCO2, PO2 CBC Recent Labs  Lab 01/16/19 0338 01/18/19 0207 01/19/19 0303  HGB 8.4* 10.1* 9.9*  HCT 28.0* 33.7* 32.9*  WBC 6.6 7.1 6.5  PLT 258 249 264   COAGULATION No results for input(s): INR in the last 168 hours.  CARDIAC   No results for input(s): TROPONINI in the last 168 hours. No results for input(s): PROBNP in the last 168 hours.  CHEMISTRY Recent Labs  Lab 01/13/19 0837 01/16/19 0338 01/17/19 0539  01/18/19 0207 01/19/19 0303  NA 143 138 136 135 136  K 3.8 4.5 4.3 4.5 4.3  CL 105 104 102 101 100  CO2 _0 GLUCOSE 109* 187* 119* 113* 183*  BUN 85* 44* 35* 30* 28*  CREATININE 1.05 0.69 0.68 0.59* 0.61  CALCIUM 8.2* 8.7* 8.6* 8.5* 8.6*   Estimated Creatinine Clearance: 107.8 mL/min (by C-G formula based on SCr of 0.61 mg/dL).  LIVER No results for input(s): AST, ALT, ALKPHOS, BILITOT, PROT, ALBUMIN, INR in the last 168 hours. INFECTIOUS No results for input(s): LATICACIDVEN, PROCALCITON in the last 168 hours. ENDOCRINE CBG (last 3)  Recent Labs    01/18/19 1951 01/18/19 2319 01/19/19 0737  GLUCAP 144* 158* 177*   IMAGING  Dg Chest Port 1 View  Result Date: 01/19/2019 CLINICAL DATA:  Acute respiratory failure EXAM: PORTABLE CHEST 1 VIEW COMPARISON:  01/15/2019 FINDINGS: Cardiac shadow is stable. Left lung remains clear. Endotracheal tube is noted in satisfactory position. Right basilar atelectasis is noted increased from the prior exam. No pneumothorax is seen. No acute bony abnormality is noted. IMPRESSION: Increasing right basilar atelectasis. Electronically Signed   By: Inez Catalina M.D.   On: 01/19/2019 07:23   Resolved Hospital Problem list   AKI Pneumonia Hypokalemia Hypernatremia Cardiomyopathy evidenced via 1/4 ECHO, resolved.  Assessment & Plan:   RESP Acute Hypoxemic Respiratory Failure s/p Tracheostomy In setting of Flu A, Corynebacterium PNA, now new resp cx with Pseudomonas and persistent copious secretions, improved since restarting abx. Currently on 5L off the wall via trach. CXR this am stable from prior. - s/p ceftaz - plan colistin inhaled 14 days total - Pulmonary toilette - wean off O2 for O2 saturation > 88% - resume full vent support tonight - continue metaneb daily - trach care per routine  CARDIAC Hx HTN, HLD, CAD BP stable overnight.    - continue ASA, lipitor, coreg - losartan d/c'd  ENDOCRINE DM - H/o poorly  controlled, hx amputation. CBGs 100s. - SSI, levemir  NEURO Agitated Delirium, resolved  - seroquel, klonopin to continue  HEME Anemia, stable - No overt signs of bleeding. - monitor CBC - transfuse per ICU guidelines - lovenox for DVT prophylaxis  DERM Sacral Decubitus Ulcer - wound care consult - general surgery consulted on 2/21> no need for surgical debridement  RENAL AKI, resolved Hypernatremia, improved - continues on free water 200 ml q6h, consider decreasing given improvement  FEN/GI Moderate Protein Calorie Malnutrition  - continue tube feeding - continue MVI, folate - continue free water - PEG care per routine   Rory Percy, DO PGY-2, [facility] 01/19/2019 8:48 AM

## 2019-01-19 NOTE — Progress Notes (Addendum)
Physical Therapy Treatment Patient Details Name: Antonio [name]. MRN: [id number] DOB: May 21, 1956 Today's Date: 01/19/2019    History of Present Illness 63 yo admitted with flu A and PNA with acute respiratory failure intubated 1/4, trach 1/16, PEG 1/28 with course complicated by acute MI, CHF and cardiogenic shock and mucous plugging. Transferred to ICU late 2/13/early 2/14 due to dyspnea and drop in sats with thick mucous.PMhx: DM, HTN, HLD, CAD, Lt BKA, rt toe amputation    PT Comments    Pt is making significant progress.  He is now on TC 20% O2 5 L /min and using PMV with me throughout his session VSS throughout.  He was able to donn his own prosthesis with supervision, and stood and pivoted to the recliner chair with min two person assist and RW.  He is ready for progressive gait training. Because of his significant progress, I have asked CIR to take another look at him to see if he is a candidate for their intensive therapy program.  PT will continue to follow acutely for safe mobility progression.  Goals updated today.    Follow Up Recommendations  CIR     Equipment Recommendations  Rolling walker with 5" wheels;3in1 (PT)    Recommendations for Other Services   NA     Precautions / Restrictions Precautions Precautions: Fall Other Brace: LLE prothesis, PEG, PMV (make sure cuff is deflated) Restrictions Weight Bearing Restrictions: No LLE Weight Bearing: Weight bearing as tolerated    Mobility  Bed Mobility Overal bed mobility: Needs Assistance Bed Mobility: Supine to Sit     Supine to sit: Min assist;HOB elevated     General bed mobility comments: Min hand held assist to come to sitting EOB.  HOB ~30 degrees.    Transfers Overall transfer level: Needs assistance Equipment used: Rolling walker (2 wheeled) Transfers: Sit to/from Stand Sit to Stand: Min assist;+2 physical assistance Stand pivot transfers: Min assist;+2 physical assistance       General  transfer comment: Pt stood EOB with RW (prosthesis donned EOB by pt), min assist to help power up and stabilize walker.  Min assist to stand and take pivotal steps with the RW to the recliner chair.           Balance Overall balance assessment: Needs assistance Sitting-balance support: Feet supported;Single extremity supported;No upper extremity supported Sitting balance-Leahy Scale: Good Sitting balance - Comments: Pt able to donn his left leg prosthesis EOB.  Close supervision needed as he has a tendancy towards LOB on compliant mattress.     Standing balance support: Bilateral upper extremity supported Standing balance-Leahy Scale: Poor Standing balance comment: needs external support in standing.                             Cognition Arousal/Alertness: Awake/alert Behavior During Therapy: Flat affect   Area of Impairment: Problem solving;Awareness                   Current Attention Level: Selective   Following Commands: Follows one step commands consistently Safety/Judgement: Decreased awareness of safety;Decreased awareness of deficits Awareness: Emergent   General Comments: Pt is a bit crass, reports "they told me today that I would die in this hospital" asked RN and she said he mis-interpreted what the MD told him today. PMV donned which helped with communication.              Pertinent Vitals/Pain Pain Assessment:  No/denies pain           PT Goals (current goals can now be found in the care plan section) Acute Rehab PT Goals Patient Stated Goal: to walk into the bathroom so he can have a BM in peace and to walk so he can go home.  PT Goal Formulation: With patient Time For Goal Achievement: 02/02/19 Potential to Achieve Goals: Good Progress towards PT goals: Progressing toward goals(goals updated)    Frequency    Min 3X/week      PT Plan Discharge plan needs to be updated;Frequency needs to be updated       AM-PAC PT "6 Clicks"  Mobility   Outcome Measure  Help needed turning from your back to your side while in a flat bed without using bedrails?: A Little Help needed moving from lying on your back to sitting on the side of a flat bed without using bedrails?: A Little Help needed moving to and from a bed to a chair (including a wheelchair)?: A Little Help needed standing up from a chair using your arms (e.g., wheelchair or bedside chair)?: A Little Help needed to walk in hospital room?: A Lot Help needed climbing 3-5 steps with a railing? : Total 6 Click Score: 15    End of Session Equipment Utilized During Treatment: Gait belt;Other (comment)(L LE prosthesis, trach collar) Activity Tolerance: Patient tolerated treatment well Patient left: in chair;with call bell/phone within reach;with chair alarm set(posey belt chair alarm) Nurse Communication: Mobility status PT Visit Diagnosis: Muscle weakness (generalized) (M62.81);Difficulty in walking, not elsewhere classified (R26.2)     Time: 6811-5726 PT Time Calculation (min) (ACUTE ONLY): 25 min  Charges:  $Therapeutic Activity: 23-37 mins                    Jaystin Mcgarvey B. Vivienne Sangiovanni, PT, DPT  Acute Rehabilitation [phone] pager #[phone] office   01/19/2019, 1:52 PM

## 2019-01-19 NOTE — Progress Notes (Signed)
Rt attempted to place pt back on vent for rest.  Rt placed pt on and exhalation volumes would not come above 2.0L.  RT switched out vent, suctioned pt, increased air pressure in cuff and placed ETCO2 to insure placement of trach and troubleshoot.  Neither intervention fixed the problem.  At this time, pt is comfortable on TC at 28% and 5LPM. Rt will continue to monitor.

## 2019-01-20 LAB — CBC WITH DIFFERENTIAL/PLATELET
Abs Immature Granulocytes: 0.07 10*3/uL (ref 0.00–0.07)
Basophils Absolute: 0 10*3/uL (ref 0.0–0.1)
Basophils Relative: 0 %
EOS ABS: 0.2 10*3/uL (ref 0.0–0.5)
Eosinophils Relative: 3 %
HCT: 30 % — ABNORMAL LOW (ref 39.0–52.0)
Hemoglobin: 9.4 g/dL — ABNORMAL LOW (ref 13.0–17.0)
Immature Granulocytes: 1 %
Lymphocytes Relative: 30 %
Lymphs Abs: 1.9 10*3/uL (ref 0.7–4.0)
MCH: 27.8 pg (ref 26.0–34.0)
MCHC: 31.3 g/dL (ref 30.0–36.0)
MCV: 88.8 fL (ref 80.0–100.0)
Monocytes Absolute: 0.5 10*3/uL (ref 0.1–1.0)
Monocytes Relative: 7 %
Neutro Abs: 3.8 10*3/uL (ref 1.7–7.7)
Neutrophils Relative %: 59 %
Platelets: 227 10*3/uL (ref 150–400)
RBC: 3.38 MIL/uL — ABNORMAL LOW (ref 4.22–5.81)
RDW: 16.5 % — ABNORMAL HIGH (ref 11.5–15.5)
WBC: 6.5 10*3/uL (ref 4.0–10.5)
nRBC: 0 % (ref 0.0–0.2)

## 2019-01-20 LAB — GLUCOSE, CAPILLARY
GLUCOSE-CAPILLARY: 132 mg/dL — AB (ref 70–99)
Glucose-Capillary: 123 mg/dL — ABNORMAL HIGH (ref 70–99)
Glucose-Capillary: 145 mg/dL — ABNORMAL HIGH (ref 70–99)
Glucose-Capillary: 130 mg/dL — ABNORMAL HIGH (ref 70–99)
Glucose-Capillary: 145 mg/dL — ABNORMAL HIGH (ref 70–99)
Glucose-Capillary: 125 mg/dL — ABNORMAL HIGH (ref 70–99)

## 2019-01-20 LAB — BASIC METABOLIC PANEL
Anion gap: 7 (ref 5–15)
BUN: 27 mg/dL — ABNORMAL HIGH (ref 6–20)
CALCIUM: 8.5 mg/dL — AB (ref 8.9–10.3)
CO2: 30 mmol/L (ref 22–32)
Chloride: 99 mmol/L (ref 98–111)
Creatinine, Ser: 0.57 mg/dL — ABNORMAL LOW (ref 0.61–1.24)
GFR calc Af Amer: >60 mL/min (ref >60)
GFR calc non Af Amer: >60 mL/min (ref >60)
GLUCOSE: 131 mg/dL — AB (ref 70–99)
Potassium: 4.3 mmol/L (ref 3.5–5.1)
Sodium: 136 mmol/L (ref 135–145)

## 2019-01-20 LAB — MAGNESIUM: Magnesium: 1.7 mg/dL (ref 1.7–2.4)

## 2019-01-20 MED ORDER — TAPENTADOL HCL 50 MG PO TABS
50.0000 mg | ORAL_TABLET | Freq: Four times a day (QID) | ORAL | Status: DC
  Administered 2019-01-20 – 2019-02-20 (×124): 50 mg via ORAL
  Filled 2019-01-20 (×123): qty 1

## 2019-01-20 MED ORDER — TAPENTADOL HCL 50 MG PO TABS: 50.0000 mg | ORAL_TABLET | Freq: Four times a day (QID) | ORAL | Status: DC

## 2019-01-20 MED ORDER — FREE WATER
100.0000 mL | Freq: Three times a day (TID) | Status: DC
  Administered 2019-01-20 – 2019-01-31 (×34): 100 mL

## 2019-01-20 MED ORDER — FENTANYL CITRATE (PF) 100 MCG/2ML IJ SOLN
50.0000 ug | INTRAMUSCULAR | Status: DC | PRN
  Administered 2019-01-20 – 2019-01-24 (×27): 50 ug via INTRAVENOUS
  Filled 2019-01-20 (×27): qty 2

## 2019-01-20 MED ORDER — POLYETHYLENE GLYCOL 3350 17 G PO PACK
17.0000 g | PACK | Freq: Every day | ORAL | Status: DC
  Administered 2019-01-20: 17 g
  Filled 2019-01-20: qty 1

## 2019-01-20 MED ORDER — POLYETHYLENE GLYCOL 3350 17 G PO PACK
17.0000 g | PACK | Freq: Every day | ORAL | Status: DC | PRN
  Administered 2019-01-24 – 2019-01-30 (×2): 17 g
  Filled 2019-01-20 (×3): qty 1

## 2019-01-20 MED ORDER — CLONAZEPAM 0.25 MG PO TBDP
0.2500 mg | ORAL_TABLET | Freq: Two times a day (BID) | ORAL | Status: DC
  Administered 2019-01-20 – 2019-01-21 (×3): 0.25 mg
  Filled 2019-01-20 (×3): qty 1

## 2019-01-20 MED ORDER — QUETIAPINE FUMARATE 50 MG PO TABS
75.0000 mg | ORAL_TABLET | Freq: Two times a day (BID) | ORAL | Status: DC
  Administered 2019-01-20 – 2019-01-21 (×3): 75 mg
  Filled 2019-01-20 (×4): qty 1

## 2019-01-20 MED ORDER — TAPENTADOL HCL 75 MG PO TABS: 75.0000 mg | ORAL_TABLET | Freq: Four times a day (QID) | ORAL | Status: DC

## 2019-01-20 NOTE — Progress Notes (Signed)
Inpatient Rehabilitation Admissions Coordinator  Received an inpt rehab consult. I met with patient at bedside for assessment. I will have to verify with family caregiver support available at d/c to assist with planning most appropriate rehab venue of SNF vs CIR admit. I will follow up tomorrow.  Danne Baxter, RN, MSN Rehab Admissions Coordinator [phone] 01/20/2019 3:12 PM

## 2019-01-20 NOTE — Progress Notes (Signed)
Physical Therapy Treatment Patient Details Name: Antonio [name]. MRN: [id number] DOB: 12/27/1955 Today's Date: 01/20/2019    History of Present Illness 63 yo admitted with flu A and PNA with acute respiratory failure intubated 1/4, trach 1/16, PEG 1/28 with course complicated by acute MI, CHF and cardiogenic shock and mucous plugging. Transferred to ICU late 2/13/early 2/14 due to dyspnea and drop in sats with thick mucous.PMhx: DM, HTN, HLD, CAD, Lt BKA, rt toe amputation    PT Comments    Pt was able to walk to the bathroom with two person assist and RW x 2 (15'x2) on 28% TC with VSS throughout session.  He was happy to be able to have a BM in the bathroom.  He wants to be able to go home.  We listened to [employer] music [employer]) during our session today and he was a little less flat.  He remains appropriate for CIR level therapies at discharge.  PT will continue to follow acutely for safe mobility progression  Follow Up Recommendations  CIR     Equipment Recommendations  Rolling walker with 5" wheels;3in1 (PT)    Recommendations for Other Services   NA     Precautions / Restrictions Precautions Precautions: Fall Required Braces or Orthoses: Other Brace Other Brace: LLE prothesis, PEG, PMV (make sure cuff is deflated) Restrictions Weight Bearing Restrictions: No    Mobility  Bed Mobility Overal bed mobility: Needs Assistance Bed Mobility: Supine to Sit Rolling: Mod assist Sidelying to sit: HOB elevated       General bed mobility comments: Mod assist to support trunk from elevated HOB ~40 degrees to get to sitting.  Pt insistent on using HOB elevated, but would benefit from [facility] flat and no rails practice as his trunk strength is poor.   Transfers Overall transfer level: Needs assistance Equipment used: Rolling walker (2 wheeled) Transfers: Sit to/from Stand Sit to Stand: Min assist;Min guard;From elevated surface         General transfer comment: Min  guard assist from bed initially, pt telling therapists "don't touch me", min assist from [facility] (lower) with fatigue after gait   Ambulation/Gait Ambulation/Gait assistance: Min assist;+2 safety/equipment Gait Distance (Feet): 15 Feet(15'x1 with PT/OT and then again with PT x 1 (30'total)) Assistive device: Rolling walker (2 wheeled) Gait Pattern/deviations: Step-to pattern;Narrow base of support;Trunk flexed     General Gait Details: Verbal cues for closer proximity to RW, be aware of lines, turnin the direction of lines, safe RW uses.        Balance Overall balance assessment: Needs assistance Sitting-balance support: Feet supported;No upper extremity supported;Bilateral upper extremity supported Sitting balance-Leahy Scale: Good Sitting balance - Comments: close supervision EOB.  Pt able to donn prosthesis and shoe (with min assist to help with the heel) with extra time needed due to fatigue and frustration.  Pt reports he has a long shoe horn at home.  Cues to put on prosthesis first to give him extra stability to put on his right shoe.    Standing balance support: Bilateral upper extremity supported;No upper extremity supported;Single extremity supported Standing balance-Leahy Scale: Poor Standing balance comment: needs external assist from RW, therapists, or LEs resting against BSC/toilet in standing.  He was able to attempt peri care with assist to ensure thoroughness.                             Cognition Arousal/Alertness: Awake/alert Behavior During Therapy:  Flat affect Overall Cognitive Status: Impaired/Different from baseline Area of Impairment: Awareness;Problem solving;Safety/judgement                   Current Attention Level: Selective   Following Commands: Follows one step commands consistently Safety/Judgement: Decreased awareness of safety;Decreased awareness of deficits Awareness: Emergent Problem Solving: Difficulty sequencing;Requires verbal  cues;Requires tactile cues General Comments: Pt becoming more aware of his environment, himself, and internal awareness (has to have a BM, wants to go to the bathroom).  He remains a bit impulsive and wants things done his way or not at all.  A consistant therapist helps with this as I learn how he wants things and where we can meet in the middle as far as safety goes.  PMV is helpful for cognitive assessment and he seemed to tolerate it well during our session.  Repeated cues for louder volume.          General Comments General comments (skin integrity, edema, etc.): VSS throughout mobility on 28% TC 3 L O2.  Pt coughing more frequently today without seemingly getting up much mucus.  BPs soft at end of session, pt does not report any lightheadedness.       Pertinent Vitals/Pain Pain Assessment: Faces Faces Pain Scale: Hurts whole lot Pain Location: buttocks around wound Pain Descriptors / Indicators: Grimacing;Guarding Pain Intervention(s): Limited activity within patient's tolerance;Monitored during session;Repositioned;Other (comment)(padded recliner chair with pilllows today)           PT Goals (current goals can now be found in the care plan section) Acute Rehab PT Goals Patient Stated Goal: to walk into the bathroom so he can have a BM in peace and to walk so he can go home.  Progress towards PT goals: Progressing toward goals    Frequency    Min 3X/week      PT Plan Current plan remains appropriate    Co-evaluation PT/OT/SLP Co-Evaluation/Treatment: Yes Reason for Co-Treatment: Complexity of the patient's impairments (multi-system involvement);Necessary to address cognition/behavior during functional activity;For patient/therapist safety;To address functional/ADL transfers PT goals addressed during session: Mobility/safety with mobility;Balance;Proper use of DME;Strengthening/ROM        AM-PAC PT "6 Clicks" Mobility   Outcome Measure  Help needed turning from your  back to your side while in a flat bed without using bedrails?: A Little Help needed moving from lying on your back to sitting on the side of a flat bed without using bedrails?: A Lot Help needed moving to and from a bed to a chair (including a wheelchair)?: A Little Help needed standing up from a chair using your arms (e.g., wheelchair or bedside chair)?: A Little Help needed to walk in hospital room?: A Little Help needed climbing 3-5 steps with a railing? : A Lot 6 Click Score: 16    End of Session Equipment Utilized During Treatment: Gait belt;Oxygen;Other (comment)(left LE prosthesis. ) Activity Tolerance: Patient limited by fatigue Patient left: in chair;with call bell/phone within reach;with chair alarm set;Other (comment)(posey belt chair alarm) Nurse Communication: Mobility status PT Visit Diagnosis: Muscle weakness (generalized) (M62.81);Difficulty in walking, not elsewhere classified (R26.2)     Time: 2395-3202 PT Time Calculation (min) (ACUTE ONLY): 75 min  Charges:  $Gait Training: 23-37 mins $Therapeutic Activity: 8-22 mins             Rayonna Heldman B. Lyrica Mcclarty, PT, DPT  Acute Rehabilitation [phone] pager #[phone] office            01/20/2019, 2:49 PM

## 2019-01-20 NOTE — Progress Notes (Signed)
Nutrition Follow-up  DOCUMENTATION CODES:   Not applicable  INTERVENTION:    Continue Vital AF 1.2 via PEG at 75 ml/h to provide 2160 kcals, 135 gm protein, 1460 ml free water daily  NUTRITION DIAGNOSIS:   Inadequate oral intake related to inability to eat as evidenced by NPO status.  Ongoing   GOAL:   Patient will meet greater than or equal to 90% of their needs  Met with TF  MONITOR:   PO intake, TF tolerance, Labs, Skin, Weight trends, I & O's  ASSESSMENT:   63 y/o M who presented to [facility] 1/3 with fever, cough, chest pain & SOB. Pt found to be Influenza A positive. Developed progressive respiratory distress requiring intubation in the ER.    Patient is currently on trach collar; has been on trach collar since yesterday morning.  Currently receiving Vital AF 1.2 via PEG at 75 ml/h to provide 2160 kcals, 135 gm protein, 1460 ml free water daily. Tolerating well. Also receiving Juven BID and MVI daily to support wound healing.   Labs reviewed. CBG's: [id number] Medications reviewed and include folic acid, free water 100 ml every 8 hours, novolog, levemir, MVI, Juven, thiamine.   Diet Order:   Diet Order            Diet NPO time specified  Diet effective now              EDUCATION NEEDS:   Not appropriate for education at this time  Skin:  Skin Assessment: Skin Integrity Issues: Skin Integrity Issues:: Stage I, Unstageable Stage I: L head Unstageable: sacrum  Last BM:  2/24  Height:   Ht Readings from Last 1 Encounters:  11/29/18 6' (1.829 m)    Weight:   Wt Readings from Last 1 Encounters:  01/20/19 90.5 kg    Ideal Body Weight:  76.1 kg(adjusted for L BKA)  BMI:  28.9 (adjusted for L BKA)  Estimated Nutritional Needs:   Kcal:  2200-2400  Protein:  120-150 grams  Fluid:  > 2 L/day    Molli Barrows, RD, LDN, [place] Pager [phone] After Hours Pager [phone]

## 2019-01-20 NOTE — Progress Notes (Signed)
  Speech Language Pathology Treatment: [name] Speaking valve  Patient Details Name: Antonio [name]. MRN: [id number] DOB: 11/20/1956 Today's Date: 01/20/2019 Time: 7169-6789 SLP Time Calculation (min) (ACUTE ONLY): 22 min  Assessment / Plan / Recommendation Clinical Impression  Pt wore his PMV for almost 20 minutes today without overt signs of distress, although his voice is hoarse, low, and breathy. SLP provided Mod cues for increased volume and deeper breaths to facilitate intelligibility. Although he coughs when his voice becomes wet, he is not able to orally expectorate any secretions. Continue to recommend use when full supervision can be provided. Will continue to follow for PMV use and readiness for swallow evaluation.   HPI HPI: 63 year old male coming in with fever cough chest pain and shortness of breath was found to have influenza A. PMH: diabetes mellitus, hypertension.  Intubated multiple times 1/3 to 1/16 with trach placed 1/16.  Currently with size 6 Cuffless trach with plans to downsize to #4 and work toward decannulation.       SLP Plan  Continue with current plan of care       Recommendations         Patient may use Passy-Muir Speech Valve: Intermittently with supervision;During all therapies with supervision PMSV Supervision: Full MD: Please consider changing trach tube to : Cuffless         Oral Care Recommendations: Oral care QID Follow up Recommendations: Inpatient Rehab SLP Visit Diagnosis: Aphonia (R49.1) Plan: Continue with current plan of care       GO                Antonio [name] 01/20/2019, 4:09 PM  Antonio [name], M.A. CCC-SLP Acute Herbalist [phone] Office [phone]

## 2019-01-20 NOTE — Consult Note (Signed)
WOC Nurse wound follow up.  Patient receiving care on MC-2M11C.  No family present.   Wound type: unstageable HAPI Measurement: deferred  Wound bed: 100% yellow slough  Drainage (amount, consistency, odor) Moderate serosanguineous on existing dressing  Periwound: erythematous  Dressing procedure/placement/frequency: Patient receiving Hydrotherapy M-W-F, Santyl order in place. Santyl order modified to reflect packing wound with saline moistened gauze and avoiding foam dressings.    Monitor the wound area(s) for worsening of condition such as: Signs/symptoms of infection,  Increase in size,  Development of or worsening of odor, Development of pain, or increased pain at the affected locations.  Notify the medical team if any of these develop.  Thank you for the consult.  Discussed plan of care with the patient and bedside nurse.   Helmut Muster, RN, MSN, CWOCN, CNS-BC, pager [phone]

## 2019-01-20 NOTE — Progress Notes (Signed)
NAME:  Melesio Madara., MRN:  [id number], DOB:  10-10-1956, LOS: 54 ADMISSION DATE:  11/28/2018, CONSULTATION DATE:  1/3 REFERRING MD:  EDP, CHIEF COMPLAINT:  Dyspnea   Brief History   63 y/o M admitted 1/3 with influenza A causing respiratory failure and cardiogenic shock. Prolonged mechanical ventilation, tracheostomy placed on January 16.  Developed HCAP.  Difficult wean, has failed multiple attempts at downsize with mucus plugging. Repeat resp cx 2/18 Pseduomonas.  Past Medical History  DM2, CAD, HTN, HLD, tobacco use  Significant Hospital Events   1/03  Admit, Influenza A positive 1/06  Self extubated  1/07  Re-intubated  1/08  Self-extubated  1/09  Reintubated, mucus plugging on FOB 1/10  Fever to 103, abx restarted  1/16  Diuresing, increased WOB with weaning efforts.  Trach.  1/20  Failed wean, anxiety / secretions 1/21  Trach revision to XLT distal.  FOB  1/22  Thick secretions barrier to wean  1/24  Failed SBT, fever 1/25  Episode of mucus plugging after chest PT/wound care  2/03  Weaned 15 minutes  2/11  ATC  2/14  Trach changed to #4, mucus plugging >back to #5 XLT 2/15  Failed ATC with increased WOB 2/16  Failed ATC with increased WOB  2/21 back on pressure support  Consults:  PCCM Cardiology  General surgery 2/21 for sacral wound  Procedures:  ETT 1/3 >>1/6, 1/7 > 1/8> 1/9> 1/16 Trach 1/16 >>  PEG 1/28 >>     Significant Diagnostic Tests:  UDS 1/4 >negative  ECHO 1/4 >LVEF 20-25%, akinesis of the anteroseptal, anterolateral & apical myocardium, grade 1 diastolic dysfunction, small pericardial effusion without evidence of hemodynamic compromise. CT Chest / ABD / Pelvis 1/3 > airway thickening, motion artifact, CAD Echo 1/29> LVEF 60-65%, left ventricular hypertrophy, trivial pericardial effusion  Micro Data:  RVP 1/3 > positive for influenza A BCx2 1/3 >No growth UC 1/4 >No Growth Sputum Cx 1/7 > Normal respiratory flora BAL  >corynebacterium  Blood Cx 1/10 > neg Sputum 1/21 > abundant corynebacterium striatum Sputum 2/18 > Pseudomonas  Antimicrobials:  Vanco 1/3 x1;    Zosyn 1/30 x1  Tamiflu 1/3 >1/8 Vancomycin 1/10 > 1/12 Ceftaz 1/10 > 1/12; 1/21 >1/22 Emycin 1/12 > 1/22 Cefepime 1/23 > 1/24 Vanc 1/21> 1/28 Colistin 2/19 > 2/24 Meropenem 2/19 > 2/20 Ceftaz 2/20 > 2/23, 2/24 >  Interim history/subjective:  Trial of PMV yesterday, did well. Doing well with PT.  Objective   Blood pressure (!) 155/79, pulse 79, temperature 98.3 F (36.8 C), temperature source Oral, resp. rate 20, height 6' (1.829 m), weight 90.5 kg, SpO2 96 %.    FiO2 (%):  [28 %-30 %] 28 %   Intake/Output Summary (Last 24 hours) at 01/20/2019 0720 Last data filed at 01/20/2019 6599 Gross per 24 hour  Intake 2239.49 ml  Output 2000 ml  Net 239.49 ml   Filed Weights   01/18/19 0242 01/19/19 0317 01/20/19 0500  Weight: 91.1 kg 91.2 kg 90.5 kg   EXAM:   General: chronically ill appearing, NAD HENT: TC in place, secretions noted PULM: slightly rhonchorous throughout CV: RRR, no murmur GI: soft, TTP diffusely, +BS MSK: normal bulk and tone, no edema Neuro: alert, awake, mouths over vent  LABS   PULMONARY No results for input(s): PHART, PCO2ART, PO2ART, HCO3, TCO2, O2SAT in the last 168 hours.  Invalid input(s): PCO2, PO2 CBC Recent Labs  Lab 01/18/19 0207 01/19/19 0303 01/20/19 0401  HGB 10.1* 9.9* 9.4*  HCT 33.7* 32.9* 30.0*  WBC 7.1 6.5 6.5  PLT 249 264 227   COAGULATION No results for input(s): INR in the last 168 hours.  CARDIAC   No results for input(s): TROPONINI in the last 168 hours. No results for input(s): PROBNP in the last 168 hours.  CHEMISTRY Recent Labs  Lab 01/16/19 0338 01/17/19 0539 01/18/19 0207 01/19/19 0303 01/20/19 0401  NA 138 136 135 136 136  K 4.5 4.3 4.5 4.3 4.3  CL 104 102 101 100 99  CO2 _0 GLUCOSE 187* 119* 113* 183* 131*  BUN 44* 35* 30* 28* 27*    CREATININE 0.69 0.68 0.59* 0.61 0.57*  CALCIUM 8.7* 8.6* 8.5* 8.6* 8.5*  MG  --   --   --   --  1.7   Estimated Creatinine Clearance: 107.8 mL/min (A) (by C-G formula based on SCr of 0.57 mg/dL (L)).  LIVER No results for input(s): AST, ALT, ALKPHOS, BILITOT, PROT, ALBUMIN, INR in the last 168 hours. INFECTIOUS No results for input(s): LATICACIDVEN, PROCALCITON in the last 168 hours. ENDOCRINE CBG (last 3)  Recent Labs    01/19/19 1926 01/19/19 2347 01/20/19 0344  GLUCAP 136* 129* 123*   IMAGING  Dg Chest Port 1 View  Result Date: 01/19/2019 CLINICAL DATA:  Acute respiratory failure EXAM: PORTABLE CHEST 1 VIEW COMPARISON:  01/15/2019 FINDINGS: Cardiac shadow is stable. Left lung remains clear. Endotracheal tube is noted in satisfactory position. Right basilar atelectasis is noted increased from the prior exam. No pneumothorax is seen. No acute bony abnormality is noted. IMPRESSION: Increasing right basilar atelectasis. Electronically Signed   By: Inez Catalina M.D.   On: 01/19/2019 07:23   Resolved Hospital Problem list   AKI Pneumonia Hypokalemia Hypernatremia Cardiomyopathy evidenced via 1/4 ECHO, resolved.  Assessment & Plan:   RESP Acute Hypoxemic Respiratory Failure s/p Tracheostomy In setting of Flu A, Corynebacterium PNA, now new resp cx with Pseudomonas and persistent copious secretions, improved since restarting abx. Remained on TC overnight.  - s/p colistin - plan ceftaz 14 days total - Pulmonary toilette - wean off O2 for O2 saturation > 88% - continue metaneb daily - trach care per routine  CARDIAC Hx HTN, HLD, CAD BP stable overnight.    - continue ASA, lipitor, coreg - losartan d/c'd  ENDOCRINE DM - H/o poorly controlled, hx amputation. CBGs 100s. - SSI, levemir  NEURO Agitated Delirium, resolved  - seroquel, klonopin to continue  HEME Anemia, stable - No overt signs of bleeding. - monitor CBC - transfuse per ICU guidelines - lovenox for DVT  prophylaxis  DERM Sacral Decubitus Ulcer - wound care consult - general surgery consulted on 2/21> no need for surgical debridement  RENAL AKI, resolved Hypernatremia, improved - continues on free water 200 ml q8h, will decrease given improvement  FEN/GI Moderate Protein Calorie Malnutrition  - continue tube feeding - continue MVI, folate - continue free water - PEG care per routine - prn miralax, colace  Rory Percy, DO PGY-2, [facility] 01/20/2019 7:20 AM

## 2019-01-20 NOTE — Progress Notes (Signed)
eLink Physician-Brief Progress Note Patient Name: Antonio [name]. DOB: 1956-08-06 MRN: [id number]   Date of Service  01/20/2019  HPI/Events of Note  Request for AM lab orders.   eICU Interventions  Will order CBC with platelets, BMP and Mg++ level in AM.      Intervention Category Major Interventions: Other:  Lenell Antu 01/20/2019, 3:37 AM

## 2019-01-20 NOTE — Progress Notes (Addendum)
Occupational Therapy Treatment Patient Details Name: Antonio [name]. MRN: [id number] DOB: [birth year] Today's Date: 01/20/2019    History of present illness 63 yo admitted with flu A and PNA with acute respiratory failure intubated 1/4, trach 1/16, PEG 1/28 with course complicated by acute MI, CHF and cardiogenic shock and mucous plugging. Transferred to ICU late 2/13/early 2/14 due to dyspnea and drop in sats with thick mucous.PMhx: DM, HTN, HLD, CAD, Lt BKA, rt toe amputation   OT comments  Pt progressing towards established OT goals. At EOB, pt donning his prosthetic with Min Guard A and donning his shoe for right foot with Min A and significant amount of time due to decreased problem solving. Pt performing functional mobility to bathroom to perform toilet transfer to [facility] over toilet. Pt requiring Min A +2 for toileting and demonstrated decreased balance and standing tolerance. Due to increased activity tolerance and functional performance, update dc recommendation to CIR for intensive OT and will continue to follow acutely as admitted.    Follow Up Recommendations  CIR;Supervision/Assistance - 24 hour    Equipment Recommendations  Other (comment)(Defer to next venue)    Recommendations for Other Services Rehab consult    Precautions / Restrictions Precautions Precautions: Fall Required Braces or Orthoses: Other Brace Other Brace: LLE prothesis, PEG, PMV (make sure cuff is deflated) Restrictions Weight Bearing Restrictions: No LLE Weight Bearing: Weight bearing as tolerated       Mobility Bed Mobility Overal bed mobility: Needs Assistance Bed Mobility: Supine to Sit Rolling: Mod assist Sidelying to sit: HOB elevated Supine to sit: Mod assist     General bed mobility comments: Mod assist to support trunk from elevated HOB ~40 degrees to get to sitting.  Pt insistent on using HOB elevated, but would benefit from [facility] flat and no rails practice as his trunk strength is  poor.   Transfers Overall transfer level: Needs assistance Equipment used: Rolling walker (2 wheeled) Transfers: Sit to/from Stand Sit to Stand: Min assist;Min guard;From elevated surface         General transfer comment: Min guard assist from bed initially, pt telling therapists "don't touch me", min assist from [facility] (lower) with fatigue after gait     Balance Overall balance assessment: Needs assistance Sitting-balance support: Feet supported;No upper extremity supported;Bilateral upper extremity supported Sitting balance-Leahy Scale: Good Sitting balance - Comments: close supervision EOB.  Pt able to donn prosthesis and shoe (with min assist to help with the heel) with extra time needed due to fatigue and frustration.  Pt reports he has a long shoe horn at home.  Cues to put on prosthesis first to give him extra stability to put on his right shoe.    Standing balance support: Bilateral upper extremity supported;No upper extremity supported;Single extremity supported Standing balance-Leahy Scale: Poor Standing balance comment: needs external assist from RW, therapists, or LEs resting against BSC/toilet in standing.  He was able to attempt peri care with assist to ensure thoroughness.                            ADL either performed or assessed with clinical judgement   ADL Overall ADL's : Needs assistance/impaired     Grooming: Wash/dry hands;Supervision/safety;Set up;Sitting Grooming Details (indicate cue type and reason): Use of hand sanitizer               Lower Body Dressing Details (indicate cue type and reason): Pt donning prosthetic with  Min Guard A while sitting at EOB. Min A for donning tennis shoe at EOB. Pt with difficulty problem solving how to don his shoe and required cues and significant time. Pt also reporting he uses a shoe horn at home, so assisted with sliding heel into shoe Toilet Transfer: Minimal assistance;+2 for  safety/equipment;BSC;Ambulation;RW Toilet Transfer Details (indicate cue type and reason): Min A for power up from toilet and to gain balance Toileting- Clothing Manipulation and Hygiene: Minimal assistance;+2 for physical assistance;Sit to/from stand Toileting - Clothing Manipulation Details (indicate cue type and reason): Min A for balance in standing. Cues for pt to perform self peri care after BM     Functional mobility during ADLs: Minimal assistance;+2 for safety/equipment;+2 for physical assistance;Rolling walker General ADL Comments: Pt continues to require increased physical A for balance. Cues for problem solving, attention, and sequencing.      Vision       Perception     Praxis      Cognition Arousal/Alertness: Awake/alert Behavior During Therapy: Flat affect Overall Cognitive Status: Impaired/Different from baseline Area of Impairment: Awareness;Problem solving;Safety/judgement;Attention;Following commands                   Current Attention Level: Selective   Following Commands: Follows one step commands consistently Safety/Judgement: Decreased awareness of safety;Decreased awareness of deficits Awareness: Emergent Problem Solving: Difficulty sequencing;Requires verbal cues;Requires tactile cues General Comments: Pt becoming more aware of his environment, himself, and internal awareness (has to have a BM, wants to go to the bathroom).  He remains a bit impulsive and wants things done his way or not at all.  A consistant therapist helps with this as I learn how he wants things and where we can meet in the middle as far as safety goes.  PMV is helpful for cognitive assessment and he seemed to tolerate it well during our session.  Repeated cues for louder volume.         Exercises     Shoulder Instructions       General Comments VSS throughout mobility on 28% TC 3 L O2.  Pt coughing more frequently today without seemingly getting up much mucus.  BPs soft at  end of session, pt does not report any lightheadedness.     Pertinent Vitals/ Pain       Pain Assessment: Faces Faces Pain Scale: Hurts whole lot Pain Location: buttocks around wound Pain Descriptors / Indicators: Grimacing;Guarding Pain Intervention(s): Monitored during session;Limited activity within patient's tolerance;Repositioned  Home Living                                          Prior Functioning/Environment              Frequency  Min 2X/week        Progress Toward Goals  OT Goals(current goals can now be found in the care plan section)  Progress towards OT goals: Progressing toward goals  Acute Rehab OT Goals Patient Stated Goal: to walk into the bathroom so he can have a BM in peace and to walk so he can go home.  OT Goal Formulation: With patient Time For Goal Achievement: 01/28/19 Potential to Achieve Goals: Good ADL Goals Pt Will Perform Grooming: with set-up;with supervision Pt Will Perform Upper Body Dressing: with min guard assist Pt Will Transfer to Toilet: with min assist;bedside commode Additional ADL Goal #1: Pt  will perform bed mobility with Min A in preparation for ADLs Additional ADL Goal #2: Pt will sit at EOB for ~5 minutes with Min Guard A in preparation for ADLs Additional ADL Goal #3: Pt will follow 50% on cues in quiet environement during ADL  Plan Discharge plan needs to be updated    Co-evaluation    PT/OT/SLP Co-Evaluation/Treatment: Yes Reason for Co-Treatment: Complexity of the patient's impairments (multi-system involvement);Necessary to address cognition/behavior during functional activity;For patient/therapist safety;To address functional/ADL transfers PT goals addressed during session: Mobility/safety with mobility;Balance;Proper use of DME;Strengthening/ROM OT goals addressed during session: ADL's and self-care      AM-PAC OT "6 Clicks" Daily Activity     Outcome Measure   Help from another person  eating meals?: Total Help from another person taking care of personal grooming?: A Little Help from another person toileting, which includes using toliet, bedpan, or urinal?: A Little Help from another person bathing (including washing, rinsing, drying)?: A Lot Help from another person to put on and taking off regular upper body clothing?: A Little Help from another person to put on and taking off regular lower body clothing?: A Lot 6 Click Score: 14    End of Session Equipment Utilized During Treatment: Rolling walker;Gait belt;Other (comment);Oxygen(vent)  OT Visit Diagnosis: Unsteadiness on feet (R26.81);Other abnormalities of gait and mobility (R26.89);Muscle weakness (generalized) (M62.81);Other symptoms and signs involving cognitive function   Activity Tolerance Patient tolerated treatment well   Patient Left in chair;with call bell/phone within reach;with chair alarm set;Other (comment)(Posy belt)   Nurse Communication Mobility status;Other (comment)(BM)        Time: 0865-7846 OT Time Calculation (min): 42 min  Charges: OT General Charges $OT Visit: 1 Visit OT Treatments $Self Care/Home Management : 23-37 mins  Joaopedro Eschbach MSOT, OTR/L Acute Rehab Pager: [phone] Office: [phone]   Theodoro Grist Adalai Perl 01/20/2019, 4:06 PM

## 2019-01-21 ENCOUNTER — Inpatient Hospital Stay (HOSPITAL_COMMUNITY)
Admission: RE | Admit: 2019-01-21 | Payer: Medicaid Other | Source: Intra-hospital | Admitting: Physical Medicine & Rehabilitation

## 2019-01-21 LAB — BASIC METABOLIC PANEL
ANION GAP: 8 (ref 5–15)
BUN: 25 mg/dL — ABNORMAL HIGH (ref 6–20)
CO2: 29 mmol/L (ref 22–32)
Calcium: 8.6 mg/dL — ABNORMAL LOW (ref 8.9–10.3)
Chloride: 99 mmol/L (ref 98–111)
Creatinine, Ser: 0.54 mg/dL — ABNORMAL LOW (ref 0.61–1.24)
GFR calc Af Amer: >60 mL/min (ref >60)
GFR calc non Af Amer: >60 mL/min (ref >60)
Glucose, Bld: 162 mg/dL — ABNORMAL HIGH (ref 70–99)
POTASSIUM: 4 mmol/L (ref 3.5–5.1)
Sodium: 136 mmol/L (ref 135–145)

## 2019-01-21 LAB — GLUCOSE, CAPILLARY
Glucose-Capillary: 127 mg/dL — ABNORMAL HIGH (ref 70–99)
Glucose-Capillary: 137 mg/dL — ABNORMAL HIGH (ref 70–99)
Glucose-Capillary: 148 mg/dL — ABNORMAL HIGH (ref 70–99)
Glucose-Capillary: 151 mg/dL — ABNORMAL HIGH (ref 70–99)
Glucose-Capillary: 154 mg/dL — ABNORMAL HIGH (ref 70–99)
Glucose-Capillary: 157 mg/dL — ABNORMAL HIGH (ref 70–99)

## 2019-01-21 LAB — HEMOGLOBIN A1C
HEMOGLOBIN A1C: 7.3 % — AB (ref 4.8–5.6)
Mean Plasma Glucose: 162.81 mg/dL

## 2019-01-21 MED ORDER — CHLORHEXIDINE GLUCONATE 0.12 % MT SOLN
OROMUCOSAL | Status: AC
  Administered 2019-01-21: 15 mL via OROMUCOSAL
  Filled 2019-01-21: qty 15

## 2019-01-21 MED ORDER — CHLORHEXIDINE GLUCONATE 0.12 % MT SOLN
OROMUCOSAL | Status: AC
  Filled 2019-01-21: qty 15

## 2019-01-21 NOTE — Progress Notes (Signed)
Inpatient Rehabilitation Admissions Coordinator  I contacted pt's son , [name] , by phone. I discussed goals and expectations of an inpt rehab and the projected need for 24/7 physical assist at home even after an inpt rehab admit of 2 to 3 weeks. [name] will contact other family members to see if they can arrange 24/7 assist required before determining rehab venue of CIR vs SNF. I will follow up tomorrow.  Ottie Glazier, RN, MSN Rehab Admissions Coordinator [phone] 01/21/2019 2:33 PM

## 2019-01-21 NOTE — Progress Notes (Signed)
PROGRESS NOTE  Antonio [name]. BTD:[id number] DOB: [birth year] DOA: 11/28/2018 PCP: Imagene Riches, NP  HPI/Recap of past 24 hours: This is a 63 y.o., male admitted on 11/28/2018 for influenza A complicated by respiratory failure, cardiogenic shock, potential viral induced cardiomyopathy, prolonged ICU stay status post tracheostomy tube for chronic respiratory failure on January 16 following course complicated by recurrent ICU admission for worsening respiratory failure and hospital-acquired pneumonia, pseudomonal pneumonia, pansensitive.  Most recent cultures from 01/13/2019 with Pseudomonas.  Long discussion with family at bedside today regarding risks, benefits and alternatives of progressing of care going to a ventilator SNF facility.  Understanding he does not have LTAC benefits.  At this point the family as well as the patient are very frustrated with her long ICU and hospitalization stay.  At this point we also encouraged the family to meet and have discussions regarding goals of care and how we would like to proceed we are here to support them with.  At this point he is doing well tolerating trach collar trial this morning.  His secretions are improved.  Transferred to [facility] services on 01/21/2019.  01/21/2019: Patient seen and examined at bedside.  He has no new complaints.  He denies dyspnea or chest pain at rest.  States cough is improving.   Assessment/Plan: Active Problems:   Chest pain   SIRS (systemic inflammatory response syndrome) (HCC)   Acute respiratory failure with hypoxemia (HCC)   Pressure injury of skin   Influenza A virus present   Pneumonia   Tracheostomy status (HCC)   Acute pulmonary edema (HCC)   Hypoxia   Fever, unspecified   Tachycardia   Acute on chronic respiratory failure (HCC)   Hypoxemia   Phlegm in throat   Dysphagia   Status post tracheostomy ([place])   HCAP (healthcare-associated pneumonia)   Diabetes mellitus type 2 in nonobese [facility])  Essential hypertension   S/P BKA (below knee amputation) unilateral, left (HCC)   Tachypnea   Pain   Agitation   Acute blood loss anemia  Respiratory failure status post tracheostomy secondary to influenza A, Corynebacterium pneumoniae, Pseudomonas States cough is improving Continue Ceftaz for 14 days course Of ventilator trach collar since 01/19/2019 Transferred to [facility] service on 01/21/2019 Maintain O2 saturation greater than 92% Routine trach care Labs reviewed and are stable Bowel sounds stable  Hypertension Continue Coreg Losartan has been discontinued Continue to monitor vital signs  Hyperlipidemia Continue Lipitor  Coronary artery disease Continue aspirin, Lipitor and Coreg  AKI, resolved    Code Status: Full code  Family Communication: None at bedside  Disposition Plan: CIR versus SNF when bed is available   Consultants:   CIR   DVT prophylaxis:  SQ lovenox daily   Objective: Vitals:   01/21/19 0700 01/21/19 0857 01/21/19 0900 01/21/19 1000  BP: 116/63 139/66 126/60 (!) 105/57  Pulse: 71 68 69 72  Resp: 20  (!) 26 15  Temp: 98.1 F (36.7 C)     TempSrc: Oral     SpO2: 99%  97% 98%  Weight:      Height:        Intake/Output Summary (Last 24 hours) at 01/21/2019 1037 Last data filed at 01/21/2019 1000 Gross per 24 hour  Intake 2760.51 ml  Output 1455 ml  Net 1305.51 ml   Filed Weights   01/19/19 0317 01/20/19 0500 01/21/19 0417  Weight: 91.2 kg 90.5 kg 90.4 kg    Exam:  . General: 63 y.o. year-old male  well developed well nourished in no acute distress.  Alert and oriented x3. . Cardiovascular: Regular rate and rhythm with no rubs or gallops.  No thyromegaly or JVD noted.   Marland Kitchen Respiratory: Mild rales bilaterally with no wheezes. Poor inspiratory effort. . Abdomen: Soft nontender nondistended with normal bowel sounds x4 quadrants. . Musculoskeletal: No lower extremity edema. 2/4 pulses in all 4 extremities. Marland Kitchen Psychiatry: Mood is  appropriate for condition and setting   Data Reviewed: CBC: Recent Labs  Lab 01/16/19 0338 01/18/19 0207 01/19/19 0303 01/20/19 0401  WBC 6.6 7.1 6.5 6.5  NEUTROABS  --  4.1 3.8 3.8  HGB 8.4* 10.1* 9.9* 9.4*  HCT 28.0* 33.7* 32.9* 30.0*  MCV 88.9 89.4 89.4 88.8  PLT 258 249 264 370   Basic Metabolic Panel: Recent Labs  Lab 01/17/19 0539 01/18/19 0207 01/19/19 0303 01/20/19 0401 01/21/19 0254  NA 136 135 136 136 136  K 4.3 4.5 4.3 4.3 4.0  CL 102 101 100 99 99  CO2 '26 28 28 30 29  ' GLUCOSE 119* 113* 183* 131* 162*  BUN 35* 30* 28* 27* 25*  CREATININE 0.68 0.59* 0.61 0.57* 0.54*  CALCIUM 8.6* 8.5* 8.6* 8.5* 8.6*  MG  --   --   --  1.7  --    GFR: Estimated Creatinine Clearance: 107.8 mL/min (A) (by C-G formula based on SCr of 0.54 mg/dL (L)). Liver Function Tests: No results for input(s): AST, ALT, ALKPHOS, BILITOT, PROT, ALBUMIN in the last 168 hours. No results for input(s): LIPASE, AMYLASE in the last 168 hours. No results for input(s): AMMONIA in the last 168 hours. Coagulation Profile: No results for input(s): INR, PROTIME in the last 168 hours. Cardiac Enzymes: No results for input(s): CKTOTAL, CKMB, CKMBINDEX, TROPONINI in the last 168 hours. BNP (last 3 results) No results for input(s): PROBNP in the last 8760 hours. HbA1C: Recent Labs    01/21/19 0401  HGBA1C 7.3*   CBG: Recent Labs  Lab 01/20/19 1534 01/20/19 1923 01/20/19 2331 01/21/19 0346 01/21/19 0730  GLUCAP 145* 125* 132* 154* 137*   Lipid Profile: No results for input(s): CHOL, HDL, LDLCALC, TRIG, CHOLHDL, LDLDIRECT in the last 72 hours. Thyroid Function Tests: No results for input(s): TSH, T4TOTAL, FREET4, T3FREE, THYROIDAB in the last 72 hours. Anemia Panel: No results for input(s): VITAMINB12, FOLATE, FERRITIN, TIBC, IRON, RETICCTPCT in the last 72 hours. Urine analysis:    Component Value Date/Time   COLORURINE YELLOW 11/29/2018 0949   APPEARANCEUR HAZY (A) 11/29/2018 0949    LABSPEC 1.024 11/29/2018 0949   PHURINE 5.0 11/29/2018 0949   GLUCOSEU >=500 (A) 11/29/2018 0949   HGBUR SMALL (A) 11/29/2018 0949   BILIRUBINUR NEGATIVE 11/29/2018 0949   KETONESUR 5 (A) 11/29/2018 0949   PROTEINUR 100 (A) 11/29/2018 0949   NITRITE NEGATIVE 11/29/2018 0949   LEUKOCYTESUR NEGATIVE 11/29/2018 0949   Sepsis Labs: '@LABRCNTIP' (procalcitonin:4,lacticidven:4)  ) Recent Results (from the past 240 hour(s))  Culture, respiratory     Status: None   Collection Time: 01/13/19 11:53 AM  Result Value Ref Range Status   Specimen Description SPUTUM  Final   Special Requests NONE  Final   Gram Stain   Final    FEW WBC PRESENT, PREDOMINANTLY PMN FEW GRAM NEGATIVE RODS Performed at [facility], [address] [place], [place] [address]    Culture MODERATE PSEUDOMONAS AERUGINOSA  Final   Report Status 01/15/2019 FINAL  Final   Organism ID, Bacteria PSEUDOMONAS AERUGINOSA  Final  Susceptibility   Pseudomonas aeruginosa - MIC*    CEFTAZIDIME 4 SENSITIVE Sensitive     CIPROFLOXACIN <=0.25 SENSITIVE Sensitive     GENTAMICIN <=1 SENSITIVE Sensitive     IMIPENEM 2 SENSITIVE Sensitive     PIP/TAZO <=4 SENSITIVE Sensitive     CEFEPIME 2 SENSITIVE Sensitive     * MODERATE PSEUDOMONAS AERUGINOSA      Studies: No results found.  Scheduled Meds: . arformoterol  15 mcg Nebulization BID  . aspirin  81 mg Per Tube Daily  . atorvastatin  40 mg Per Tube q1800  . budesonide (PULMICORT) nebulizer solution  0.5 mg Nebulization BID  . carvedilol  3.125 mg Per Tube BID WC  . chlorhexidine      . chlorhexidine gluconate (MEDLINE KIT)  15 mL Mouth Rinse BID  . clonazePAM  0.25 mg Per Tube BID  . collagenase   Topical Daily  . enoxaparin (LOVENOX) injection  40 mg Subcutaneous Q24H  . famotidine  20 mg Per Tube BID  . fentaNYL  1 patch Transdermal Q72H  . folic acid  1 mg Per Tube Daily  . free water  100 mL Per Tube Q8H  . guaiFENesin  5 mL Per Tube Q12H  . insulin aspart   0-20 Units Subcutaneous Q4H  . insulin detemir  35 Units Subcutaneous BID  . ipratropium-albuterol  3 mL Nebulization Q6H  . mouth rinse  15 mL Mouth Rinse QID  . multivitamin  15 mL Per Tube Daily  . nutrition supplement (JUVEN)  1 packet Per Tube BID BM  . QUEtiapine  75 mg Per Tube BID  . tapentadol  50 mg Oral QID  . thiamine  100 mg Per Tube Daily    Continuous Infusions: . sodium chloride 10 mL/hr at 01/21/19 0600  . cefTAZidime (FORTAZ)  IV Stopped (01/21/19 0600)  . feeding supplement (VITAL AF 1.2 CAL) 75 mL/hr at 01/21/19 0800     LOS: 12 days     Kayleen Memos, MD [facility] Pager [phone]  If 7PM-7AM, please contact night-coverage [email or web address] Password [facility] 01/21/2019, 10:37 AM

## 2019-01-21 NOTE — Progress Notes (Signed)
  Speech Language Pathology Treatment: [name] Speaking valve  Patient Details Name: Emery Dekam. MRN: [id number] DOB: [birth year] Today's Date: 01/21/2019 Time: 6789-3810 SLP Time Calculation (min) (ACUTE ONLY): 38 min  Assessment / Plan / Recommendation Clinical Impression  Pt wore his PMV for 30+ minutes today under direct SLP supervision with VS stable and no evidence of air trapping. Mod cues were given across session for increased vocal intensity as he remains significantly dysphonic, although he was not as audible wet in vocal quality today. He still does not expectorate secretions when he tries to cough. Pt tolerated valve well across physical activity as well, as he was assisted to [facility] by SLP and RN. Attempted to leave valve in place upon SLP departure with intermittent supervision from staff, but pt requested it be removed. Education was provided about the benefits of use, but it was removed. Will continue to follow for additional trials and readiness for instrumental swallow study.   HPI HPI: 63 year old male coming in with fever cough chest pain and shortness of breath was found to have influenza A. PMH: diabetes mellitus, hypertension.  Intubated multiple times 1/3 to 1/16 with trach placed 1/16.  Currently with size 6 Cuffless trach with plans to downsize to #4 and work toward decannulation.       SLP Plan  Continue with current plan of care       Recommendations         Patient may use Passy-Muir Speech Valve: Intermittently with supervision;During all therapies with supervision PMSV Supervision: Full MD: Please consider changing trach tube to : Cuffless         Oral Care Recommendations: Oral care QID Follow up Recommendations: Inpatient Rehab SLP Visit Diagnosis: Aphonia (R49.1) Plan: Continue with current plan of care       GO                Virl Axe Keilly Fatula 01/21/2019, 11:48 AM  [name], M.A. CCC-SLP Acute Herbalist  [phone] Office [phone]

## 2019-01-21 NOTE — Progress Notes (Addendum)
10:39am-CSW has faxed pt out to trach snf's in the area at this time.   10:21am-CSW spoke with pt and family at bedside. CSW advised pt and family that from notes, it appears that pt is being looked at by CIR. CSW explained that as a back up plan to CIR, CSW's usually do trach snf in the vent that CIR is unable to take pt for whatever reason. Pt and family appeared to be understanding and are very hopeful for CIR admission but are also agreeable to CSW sending information out to trach snf facilities at this time.   CSW continues to follow pt for further discharge needs. CSW aware that pt has been off the of the vent and has remained on trach collar at 28 percent. CSW also aware that pt being assessed by CIR at this time for admission. CSW will speak with pt and family at bedside to discuss trach snf placement in the even that CIR is unable to meet pt's needs.     Antonio [name], MSW, LCSW-A Emergency Department Clinical Social Worker [phone]

## 2019-01-21 NOTE — Progress Notes (Signed)
Physical Therapy Wound Treatment Patient Details  Name: Antonio Hy. MRN: [id number] Date of Birth: 16-Dec-1955  Today's Date: 01/21/2019 Time: 5670-1410 Time Calculation (min): 38 min  Subjective  Subjective: Pt asking, "when?" in regards to leaving for rehab Patient and Family Stated Goals: Pt on trach collar Date of Onset: (unknown) Prior Treatments: dressing changes  Pain Score:  Flinching with debridement; on Fentanyl patch  Wound Assessment  Pressure Injury 12/01/18 Unstageable - Full thickness tissue loss in which the base of the ulcer is covered by slough (yellow, tan, gray, green or brown) and/or eschar (tan, brown or black) in the wound bed. sacrum (Active)  Wound Image   01/21/2019  4:22 PM  Dressing Type Barrier Film (skin prep);ABD;Moist to moist;Gauze (Comment);Other (Comment) 01/21/2019  4:22 PM  Dressing Clean;Dry;Intact;Changed 01/21/2019  4:22 PM  Dressing Change Frequency Daily 01/21/2019  4:22 PM  State of Healing Early/partial granulation 01/21/2019  4:22 PM  Site / Wound Assessment Red;Yellow;Pink 01/21/2019  4:22 PM  % Wound base Red or Granulating 60% 01/21/2019  4:22 PM  % Wound base Yellow/Fibrinous Exudate 40% 01/21/2019  4:22 PM  % Wound base Black/Eschar 0% 01/21/2019  4:22 PM  % Wound base Other/Granulation Tissue (Comment) 0% 01/21/2019  4:22 PM  Peri-wound Assessment Erythema (blanchable) 01/21/2019  4:22 PM  Wound Length (cm) 9 cm 01/21/2019  4:22 PM  Wound Width (cm) 5 cm 01/21/2019  4:22 PM  Wound Depth (cm) 1 cm 01/21/2019  4:22 PM  Wound Surface Area (cm^2) 45 cm^2 01/21/2019  4:22 PM  Wound Volume (cm^3) 45 cm^3 01/21/2019  4:22 PM  Tunneling (cm) 0 12/31/2018 10:28 AM  Margins Unattached edges (unapproximated) 01/21/2019  4:22 PM  Drainage Amount Minimal 01/21/2019  4:22 PM  Drainage Description Purulent;Serosanguineous 01/21/2019  4:22 PM  Treatment Debridement (Selective);Hydrotherapy (Pulse lavage);Packing (Saline gauze) 01/21/2019  4:22 PM       Pressure Injury 12/22/18 Stage I -  Intact skin with non-blanchable redness of a localized area usually over a bony prominence. Quarter size hairless wound  (Active)  Dressing Type None 01/21/2019 12:00 PM  Dressing Clean;Dry;Intact 01/21/2019 12:00 PM  Dressing Change Frequency PRN 01/21/2019 12:00 PM  State of Healing Epithelialized 01/21/2019 12:00 PM  Site / Wound Assessment Clean;Dry 01/21/2019 12:00 PM  % Wound base Red or Granulating 100% 01/20/2019  7:42 PM  Tunneling (cm) 0 12/27/2018 12:00 AM  Undermining (cm) 0 12/27/2018 12:00 AM  Margins Attached edges (approximated) 01/20/2019  7:42 PM  Drainage Amount None 01/20/2019  7:42 PM  Treatment Other (Comment) 12/29/2018  8:00 PM   Hydrotherapy Pulsed lavage therapy - wound location: sacrum Pulsed Lavage with Suction (psi): 8 psi(8-12) Pulsed Lavage with Suction - Normal Saline Used: 1000 mL Pulsed Lavage Tip: Tip with splash shield Selective Debridement Selective Debridement - Location: sacrum Selective Debridement - Tools Used: Forceps;Scissors Selective Debridement - Tissue Removed: yellow necrotic tissue   Wound Assessment and Plan  Wound Therapy - Assess/Plan/Recommendations Wound Therapy - Clinical Statement: Depth of wound on superior portion continues to increase as necrotic tissue removed. No blue green drainage noted on old dressing. Overall, yellow slough seems to be increasing.Continue hydrotherapy for removal of necrotic tissue.  Wound Therapy - Functional Problem List: decr mobility Factors Delaying/Impairing Wound Healing: Diabetes Mellitus;Immobility;Multiple medical problems Hydrotherapy Plan: Debridement;Dressing change;Patient/family education;Pulsatile lavage with suction Wound Therapy - Frequency: 3X / week Wound Therapy - Follow Up Recommendations: Skilled nursing facility Wound Plan: see above  Wound Therapy Goals- Improve the function of  patient's integumentary system by progressing the wound(s) through the phases  of wound healing (inflammation - proliferation - remodeling) by: Decrease Necrotic Tissue to: 30 Decrease Necrotic Tissue - Progress: Goal set today Increase Granulation Tissue to: 70 Increase Granulation Tissue - Progress: Goal set today Goals/treatment plan/discharge plan were made with and agreed upon by patient/family: Yes Time For Goal Achievement: 7 days Wound Therapy - Potential for Goals: Fair  Goals will be updated until maximal potential achieved or discharge criteria met.  Discharge criteria: when goals achieved, discharge from hospital, MD decision/surgical intervention, no progress towards goals, refusal/missing three consecutive treatments without notification or medical reason.  GP  [name], PT, DPT Acute Rehabilitation Services Pager [phone] Office [phone]      Antonio [name] 01/21/2019, 4:29 PM

## 2019-01-21 NOTE — NC FL2 (Signed)
[place] LEVEL OF CARE SCREENING TOOL     IDENTIFICATION  Patient Name: Antonio [name]. Birthdate: [birth year] Sex: male Admission Date (Current Location): 11/28/2018  [facility] and [place] Number:  Herbalist and Address:  [facility]. [facility], [place] [address], [place], [place] [address]      Provider Number: [id number]  Attending Physician Name and Address:  Kayleen Memos, DO  Relative Name and Phone Number:  Travas, Schexnayder [phone]     Current Level of Care: Hospital Recommended Level of Care: [place] Prior Approval Number:    Date Approved/Denied:   PASRR Number: [id number]  Discharge Plan: SNF    Current Diagnoses: Patient Active Problem List   Diagnosis Date Noted  . Dysphagia   . Status post tracheostomy ([place])   . HCAP (healthcare-associated pneumonia)   . Diabetes mellitus type 2 in nonobese (HCC)   . Essential hypertension   . S/P BKA (below knee amputation) unilateral, left ([place])   . Tachypnea   . Pain   . Agitation   . Acute blood loss anemia   . Phlegm in throat   . Fever, unspecified   . Tachycardia   . Acute on chronic respiratory failure ([place])   . Hypoxemia   . Acute pulmonary edema (HCC)   . Hypoxia   . Tracheostomy status ([place])   . Pneumonia   . Influenza A virus present   . Pressure injury of skin 12/01/2018  . SIRS (systemic inflammatory response syndrome) ([place]) 11/28/2018  . Acute respiratory failure with hypoxemia ([place]) 11/28/2018  . Influenza A   . Hyponatremia   . Chest pain 05/02/2018  . Hyperlipidemia 05/02/2018  . Tobacco abuse 05/02/2018  . Coronary artery disease involving native coronary artery of native heart with angina pectoris ([place]) 05/02/2018  . S/P transmetatarsal amputation of foot, left ([place]) 04/15/2017  . Amputated great toe of right foot ([place]) 04/15/2017  . Ulcer of toe of left foot, limited to breakdown of skin ([place]) 04/04/2017  . Partial  nontraumatic amputation of foot, right ([place]) 04/04/2017  . 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 Edema     Orientation RESPIRATION BLADDER Height & Weight     Self, Time, Situation, Place  Tracheostomy(28 percent. ) Incontinent Weight: 199 lb 4.7 oz (90.4 kg) Height:  6' (182.9 cm)  BEHAVIORAL SYMPTOMS/MOOD NEUROLOGICAL BOWEL NUTRITION STATUS      Incontinent Diet(please see discharge summary. )  AMBULATORY STATUS COMMUNICATION OF NEEDS Skin   Extensive Assist Non-Verbally(pt can mouth words at this time. ) Other (Comment)(Unstageable - Full thickness tissue loss in which the base of the ulcer is covered by slough (yellow, tan, gray, green or brown) and/or eschar (tan, brown or black) in the wound bed., on sacrum, posterior.) PU Stage 1 Dressing: Daily(Sacrum - daily dressing change)                     Personal Care Assistance Level of Assistance  Bathing, Feeding, Dressing Bathing Assistance: Maximum assistance Feeding assistance: Maximum assistance Dressing Assistance: Maximum assistance Total Care Assistance: Maximum assistance   Functional Limitations Info  Sight, Hearing, Speech Sight Info: Adequate Hearing Info: Adequate Speech Info: Impaired(pt has trach that was placed on 2/14.)    SPECIAL CARE FACTORS FREQUENCY  PT (By licensed PT), OT (By licensed OT), Speech therapy     PT Frequency: 5 times a week OT Frequency:  5 times a  week     Speech Therapy Frequency: 5 times a week       Contractures Contractures Info: Not present    Additional Factors Info  Code Status, Allergies Code Status Info: Full Allergies Info: Iodinated Diagnostic Agents, Penicillins Psychotropic Info: Seroquel 275m Insulin Sliding Scale Info: 2x/daily and every 4 hours       Current Medications (01/21/2019):  This is the current hospital active medication list Current Facility-Administered Medications  Medication Dose Route Frequency Provider Last Rate Last  Dose  . chlorhexidine (PERIDEX) 0.12 % solution           . 0.9 %  sodium chloride infusion   Intravenous PRN SJennelle HumanB, NP 10 mL/hr at 01/21/19 0600    . acetaminophen (TYLENOL) solution 650 mg  650 mg Per Tube Q6H PRN MCherene Altes MD   650 mg at 01/17/19 1539  . albuterol (PROVENTIL) (2.5 MG/3ML) 0.083% nebulizer solution 2.5 mg  2.5 mg Nebulization Q2H PRN MSimonne MaffucciB, MD   2.5 mg at 12/20/18 1000  . alum & mag hydroxide-simeth (MAALOX/MYLANTA) 200-200-20 MG/5ML suspension 30 mL  30 mL Oral Q6H PRN Deterding, EGuadelupe Sabin MD   30 mL at 01/18/19 2003   And  . lidocaine (XYLOCAINE) 2 % viscous mouth solution 15 mL  15 mL Oral Q6H PRN Deterding, EGuadelupe Sabin MD      . arformoterol (BROVANA) nebulizer solution 15 mcg  15 mcg Nebulization BID MJuanito Doom MD   15 mcg at 01/21/19 08469 . aspirin chewable tablet 81 mg  81 mg Per Tube Daily AAldean Jewett MD   81 mg at 01/21/19 0902  . atorvastatin (LIPITOR) tablet 40 mg  40 mg Per Tube q1800 MCherene Altes MD   40 mg at 01/20/19 1826  . bisacodyl (DULCOLAX) suppository 10 mg  10 mg Rectal Daily PRN OFrederik Pear MD   10 mg at 12/10/18 0039  . budesonide (PULMICORT) nebulizer solution 0.5 mg  0.5 mg Nebulization BID MSimonne MaffucciB, MD   0.5 mg at 01/21/19 0826  . carvedilol (COREG) tablet 3.125 mg  3.125 mg Per Tube BID WC Ollis, Brandi L, NP   3.125 mg at 01/21/19 0857  . cefTAZidime (FORTAZ) 2 g in sodium chloride 0.9 % 100 mL IVPB  2 g Intravenous Q8H Hall, Carole N, DO   Stopped at 01/21/19 0600  . chlorhexidine gluconate (MEDLINE KIT) (PERIDEX) 0.12 % solution 15 mL  15 mL Mouth Rinse BID MSimonne MaffucciB, MD   15 mL at 01/21/19 0800  . clonazePAM (KLONOPIN) disintegrating tablet 0.25 mg  0.25 mg Per Tube BID RRory Percy DO   0.25 mg at 01/21/19 0902  . collagenase (SANTYL) ointment   Topical Daily MSimonne MaffucciB, MD      . docusate (COLACE) 50 MG/5ML liquid 100 mg  100 mg Per Tube BID PRN  ONoe GensL, NP   100 mg at 12/09/18 1709  . enoxaparin (LOVENOX) injection 40 mg  40 mg Subcutaneous Q24H EMargaretha Seeds MD   40 mg at 01/21/19 06295 . famotidine (PEPCID) 40 MG/5ML suspension 20 mg  20 mg Per Tube BID SJennelle HumanB, NP   20 mg at 01/21/19 0904  . feeding supplement (VITAL AF 1.2 CAL) liquid 1,000 mL  1,000 mL Per Tube Continuous RBrand Males MD 75 mL/hr at 01/21/19 0800    . fentaNYL (DURAGESIC) 25 MCG/HR 1 patch  1 patch Transdermal Q72H Deterding, EBenjamine Mola  C, MD   Stopped at 01/20/19 2122  . fentaNYL (SUBLIMAZE) injection 50 mcg  50 mcg Intravenous Q2H PRN Rory Percy, DO   50 mcg at 01/21/19 0858  . folic acid (FOLVITE) tablet 1 mg  1 mg Per Tube Daily Harvel Quale, RPH   1 mg at 01/21/19 7014  . free water 100 mL  100 mL Per Tube Q8H Rumball, Bryson Ha, DO   100 mL at 01/21/19 0520  . guaiFENesin (ROBITUSSIN) 100 MG/5ML solution 100 mg  5 mL Per Tube Q12H Magdalen Spatz, NP   100 mg at 01/21/19 0856  . insulin aspart (novoLOG) injection 0-20 Units  0-20 Units Subcutaneous Q4H Elsie Lincoln, MD   3 Units at 01/21/19 0857  . insulin detemir (LEVEMIR) injection 35 Units  35 Units Subcutaneous BID Rory Percy, DO   35 Units at 01/21/19 0912  . ipratropium-albuterol (DUONEB) 0.5-2.5 (3) MG/3ML nebulizer solution 3 mL  3 mL Nebulization Q6H Cherene Altes, MD   3 mL at 01/21/19 0815  . MEDLINE mouth rinse  15 mL Mouth Rinse QID Collene Gobble, MD   15 mL at 01/21/19 0455  . multivitamin liquid 15 mL  15 mL Per Tube Daily Cherene Altes, MD   15 mL at 01/21/19 0906  . nutrition supplement (JUVEN) (JUVEN) powder packet 1 packet  1 packet Per Tube BID BM Juanito Doom, MD   1 packet at 01/21/19 [phone]  . polyethylene glycol (MIRALAX / GLYCOLAX) packet 17 g  17 g Per Tube Daily PRN Rory Percy, DO      . QUEtiapine (SEROQUEL) tablet 75 mg  75 mg Per Tube BID Rory Percy, DO   75 mg at 01/21/19 0903  . tapentadol (NUCYNTA) tablet 50 mg  50  mg Oral QID Icard, Bradley L, DO   50 mg at 01/21/19 0902  . thiamine (VITAMIN B-1) tablet 100 mg  100 mg Per Tube Daily Harvel Quale, RPH   100 mg at 01/21/19 1314     Discharge Medications: Please see discharge summary for a list of discharge medications.  Relevant Imaging Results:  Relevant Lab Results:   Additional Information [id number]. Pt had trach placed on 01/09/19 5 mm shiley cuffed.  Wetzel Bjornstad, LCSWA

## 2019-01-22 LAB — GLUCOSE, CAPILLARY
Glucose-Capillary: 128 mg/dL — ABNORMAL HIGH (ref 70–99)
Glucose-Capillary: 120 mg/dL — ABNORMAL HIGH (ref 70–99)
Glucose-Capillary: 153 mg/dL — ABNORMAL HIGH (ref 70–99)
Glucose-Capillary: 129 mg/dL — ABNORMAL HIGH (ref 70–99)
Glucose-Capillary: 116 mg/dL — ABNORMAL HIGH (ref 70–99)

## 2019-01-22 MED ORDER — QUETIAPINE FUMARATE 25 MG PO TABS
25.0000 mg | ORAL_TABLET | Freq: Two times a day (BID) | ORAL | Status: AC
  Administered 2019-01-22 – 2019-01-23 (×4): 25 mg
  Filled 2019-01-22 (×4): qty 1

## 2019-01-22 MED ORDER — CLONAZEPAM 0.125 MG PO TBDP
0.2500 mg | ORAL_TABLET | Freq: Two times a day (BID) | ORAL | Status: DC | PRN
  Administered 2019-01-24: 0.25 mg
  Filled 2019-01-22: qty 2

## 2019-01-22 MED ORDER — IPRATROPIUM-ALBUTEROL 0.5-2.5 (3) MG/3ML IN SOLN
3.0000 mL | Freq: Three times a day (TID) | RESPIRATORY_TRACT | Status: DC
  Administered 2019-01-22 – 2019-01-30 (×25): 3 mL via RESPIRATORY_TRACT
  Filled 2019-01-22 (×24): qty 3

## 2019-01-22 NOTE — Progress Notes (Signed)
Inpatient Rehabilitation Admissions Coordinator  I met with patient, son, Merrily Pew, and Mom at bedside. We reviewed goals and expectations of an inpt rehab admit which includes the projected need for caregiver support after 2 to 3 weeks at CIR. There is not caregiver support needed after an inpt rehab admit, therefore SNF is recommended for a prolonged rehab course prior to return home with prn assist. RN CM made aware. We will sign off att this time.  Danne Baxter, RN, MSN Rehab Admissions Coordinator [phone] 01/22/2019 12:16 PM

## 2019-01-22 NOTE — Progress Notes (Signed)
PROGRESS NOTE    Antonio [name].  FTD:[id number] DOB: Apr 12, 1956 DOA: 11/28/2018 PCP: Imagene Riches, NP   Brief Narrative:  63 year old with history of influenza A complicated by respiratory failure, cardiogenic shock, post viral induced cardiomyopathy and prolonged ICU stay with failed extubation requiring to have tracheostomy tube placed.  Off-and-on he has recurrent pneumonia with most recent culture from about a week ago growing Pseudomonas.Plan is to continue ceftaz for total of 14 days.  From respiratory standpoint is little more stable and has been on trach collar for the past at least 48 hours.  Currently awaiting disposition plan-CIR versus skilled nursing facility   Assessment & Plan:   Active Problems:   Chest pain   SIRS (systemic inflammatory response syndrome) (HCC)   Acute respiratory failure with hypoxemia (HCC)   Pressure injury of skin   Influenza A virus present   Pneumonia   Tracheostomy status (HCC)   Acute pulmonary edema (HCC)   Hypoxia   Fever, unspecified   Tachycardia   Acute on chronic respiratory failure (HCC)   Hypoxemia   Phlegm in throat   Dysphagia   Status post tracheostomy ([place])   HCAP (healthcare-associated pneumonia)   Diabetes mellitus type 2 in nonobese ([place])   Essential hypertension   S/P BKA (below knee amputation) unilateral, left (HCC)   Tachypnea   Pain   Agitation   Acute blood loss anemia  Respiratory failure status post tracheostomy secondary to influenza A Healthcare acquired pneumonia secondary to Pseudomonas - Plans for total of ceftaz for 14 days as previously planned. - Continue routine tracheostomy care including aspiration of secretions -Pulmonary toilet, bronchodilators PRN. -Continue supportive care.  Essential hypertension - Continue Coreg.  Losartan on hold  Hyperlipidemia - Continue statin  Coronary artery disease, no chest pain -Continue cardiac medications including aspirin statin and beta-blocker.   Currently chest pain-free  Diabetes mellitus type 2 -Insulin sliding scale and Accu-Chek.  On Levemir 35 mg twice daily and NovoLog 3 units every 4 hours. HbA1c 7.3  Anemia of chronic disease -Currently stableAround 9.4  Moderate protein calorie malnutrition -Nutrition consult.  DVT prophylaxis: Lovenox Code Status: Full code Family Communication: None at bedside Disposition Plan: To be determined CIR versus skilled nursing facility   Subjective: Feels better, has been off Vent for the past 48 hrs. Secretions are improving. Good UOP per the nursing staff.  Patient able to nod and tell me he doenst have any complaints.   Review of Systems Otherwise negative except as per HPI, including: General: Denies fever, chills, night sweats or unintended weight loss. Resp: Denies cough, wheezing, shortness of breath. Cardiac: Denies chest pain, palpitations, orthopnea, paroxysmal nocturnal dyspnea. GI: Denies abdominal pain, nausea, vomiting, diarrhea or constipation GU: Denies dysuria, frequency, hesitancy or incontinence MS: Denies muscle aches, joint pain or swelling Neuro: Denies headache, neurologic deficits (focal weakness, numbness, tingling), abnormal gait Psych: Denies anxiety, depression, SI/HI/AVH Skin: Denies new rashes or lesions ID: Denies sick contacts, exotic exposures, travel  Objective: Vitals:   01/22/19 0850 01/22/19 0854 01/22/19 0900 01/22/19 1000  BP:   117/73 108/64  Pulse:   66 61  Resp:   (!) 21 (!) 22  Temp:      TempSrc:      SpO2: 98% 98% 98% 96%  Weight:      Height:        Intake/Output Summary (Last 24 hours) at 01/22/2019 1200 Last data filed at 01/22/2019 1000 Gross per 24 hour  Intake 1620.07  ml  Output 1700 ml  Net -79.93 ml   Filed Weights   01/20/19 0500 01/21/19 0417 01/22/19 0500  Weight: 90.5 kg 90.4 kg 90.8 kg    Examination:  General exam: Appears calm and comfortable ; + trach Respiratory system: Mild diffuse coarse BS.    Cardiovascular system: S1 & S2 heard, RRR. No JVD, murmurs, rubs, gallops or clicks. No pedal edema. Gastrointestinal system: Abdomen is nondistended, soft and nontender. No organomegaly or masses felt. Normal bowel sounds heard. Central nervous system: Alert and oriented. No focal neurological deficits. Extremities: Symmetric 5 x 5 power. Skin: No rashes, lesions or ulcers Psychiatry: Judgement and insight appear normal. Mood & affect appropriate.     Data Reviewed:   CBC: Recent Labs  Lab 01/16/19 0338 01/18/19 0207 01/19/19 0303 01/20/19 0401  WBC 6.6 7.1 6.5 6.5  NEUTROABS  --  4.1 3.8 3.8  HGB 8.4* 10.1* 9.9* 9.4*  HCT 28.0* 33.7* 32.9* 30.0*  MCV 88.9 89.4 89.4 88.8  PLT 258 249 264 638   Basic Metabolic Panel: Recent Labs  Lab 01/17/19 0539 01/18/19 0207 01/19/19 0303 01/20/19 0401 01/21/19 0254  NA 136 135 136 136 136  K 4.3 4.5 4.3 4.3 4.0  CL 102 101 100 99 99  CO2 '26 28 28 30 29  ' GLUCOSE 119* 113* 183* 131* 162*  BUN 35* 30* 28* 27* 25*  CREATININE 0.68 0.59* 0.61 0.57* 0.54*  CALCIUM 8.6* 8.5* 8.6* 8.5* 8.6*  MG  --   --   --  1.7  --    GFR: Estimated Creatinine Clearance: 107.8 mL/min (A) (by C-G formula based on SCr of 0.54 mg/dL (L)). Liver Function Tests: No results for input(s): AST, ALT, ALKPHOS, BILITOT, PROT, ALBUMIN in the last 168 hours. No results for input(s): LIPASE, AMYLASE in the last 168 hours. No results for input(s): AMMONIA in the last 168 hours. Coagulation Profile: No results for input(s): INR, PROTIME in the last 168 hours. Cardiac Enzymes: No results for input(s): CKTOTAL, CKMB, CKMBINDEX, TROPONINI in the last 168 hours. BNP (last 3 results) No results for input(s): PROBNP in the last 8760 hours. HbA1C: Recent Labs    01/21/19 0401  HGBA1C 7.3*   CBG: Recent Labs  Lab 01/21/19 1923 01/21/19 2345 01/22/19 0336 01/22/19 0732 01/22/19 1111  GLUCAP 148* 151* 128* 120* 153*   Lipid Profile: No results for  input(s): CHOL, HDL, LDLCALC, TRIG, CHOLHDL, LDLDIRECT in the last 72 hours. Thyroid Function Tests: No results for input(s): TSH, T4TOTAL, FREET4, T3FREE, THYROIDAB in the last 72 hours. Anemia Panel: No results for input(s): VITAMINB12, FOLATE, FERRITIN, TIBC, IRON, RETICCTPCT in the last 72 hours. Sepsis Labs: No results for input(s): PROCALCITON, LATICACIDVEN in the last 168 hours.  Recent Results (from the past 240 hour(s))  Culture, respiratory     Status: None   Collection Time: 01/13/19 11:53 AM  Result Value Ref Range Status   Specimen Description SPUTUM  Final   Special Requests NONE  Final   Gram Stain   Final    FEW WBC PRESENT, PREDOMINANTLY PMN FEW GRAM NEGATIVE RODS Performed at [facility], [address] [place], [place] [address]    Culture MODERATE PSEUDOMONAS AERUGINOSA  Final   Report Status 01/15/2019 FINAL  Final   Organism ID, Bacteria PSEUDOMONAS AERUGINOSA  Final      Susceptibility   Pseudomonas aeruginosa - MIC*    CEFTAZIDIME 4 SENSITIVE Sensitive     CIPROFLOXACIN <=0.25 SENSITIVE Sensitive  GENTAMICIN <=1 SENSITIVE Sensitive     IMIPENEM 2 SENSITIVE Sensitive     PIP/TAZO <=4 SENSITIVE Sensitive     CEFEPIME 2 SENSITIVE Sensitive     * MODERATE PSEUDOMONAS AERUGINOSA         Radiology Studies: No results found.      Scheduled Meds: . arformoterol  15 mcg Nebulization BID  . aspirin  81 mg Per Tube Daily  . atorvastatin  40 mg Per Tube q1800  . budesonide (PULMICORT) nebulizer solution  0.5 mg Nebulization BID  . carvedilol  3.125 mg Per Tube BID WC  . chlorhexidine gluconate (MEDLINE KIT)  15 mL Mouth Rinse BID  . collagenase   Topical Daily  . enoxaparin (LOVENOX) injection  40 mg Subcutaneous Q24H  . famotidine  20 mg Per Tube BID  . fentaNYL  1 patch Transdermal Q72H  . folic acid  1 mg Per Tube Daily  . free water  100 mL Per Tube Q8H  . guaiFENesin  5 mL Per Tube Q12H  . insulin aspart  0-20 Units Subcutaneous  Q4H  . insulin detemir  35 Units Subcutaneous BID  . ipratropium-albuterol  3 mL Nebulization TID  . mouth rinse  15 mL Mouth Rinse QID  . multivitamin  15 mL Per Tube Daily  . nutrition supplement (JUVEN)  1 packet Per Tube BID BM  . QUEtiapine  25 mg Per Tube BID  . tapentadol  50 mg Oral QID  . thiamine  100 mg Per Tube Daily   Continuous Infusions: . sodium chloride 10 mL/hr at 01/22/19 1000  . cefTAZidime (FORTAZ)  IV Stopped (01/22/19 1548)  . feeding supplement (VITAL AF 1.2 CAL) 75 mL/hr at 01/22/19 1000     LOS: 55 days   Time spent= 25 mins    Ankit Arsenio Loader, MD [facility]  If 7PM-7AM, please contact night-coverage [email or web address] 01/22/2019, 12:00 PM

## 2019-01-22 NOTE — Care Management Note (Signed)
Case Management Note Previous Note Created by Sidney Ace  Patient Details  Name: Norvel Richards. MRN: [id number] Date of Birth: [birth year]  Subjective/Objective:  63 yo admitted with flu A and PNA with acute respiratory failure intubated 1/4, trach 1/16 with course complicated by acute MI, CHF and cardiogenic shock.  PTA, pt resided at home with parents.                  Action/Plan: Pt not weaning to TC yet, and will likely need vent SNF.  CSW has spoken with family about this, and they are in agreement.  They do understand that placement will likely be in [place], but are hopeful that pt will improve and wean to [facility] while waiting on [place].  Will follow progress.  Expected Discharge Date:                  Expected Discharge Plan:  Skilled Nursing Facility  In-House Referral:  Clinical Social Work  Discharge planning Services  CM Consult  Post Acute Care Choice:    Choice offered to:     DME Arranged:    DME Agency:     HH Arranged:    HH Agency:     Status of Service:  In process, will continue to follow  If discussed at Long Length of Stay Meetings, dates discussed:    Additional Comments:  01/22/2019  Pt now sustaining on TC.  CIR following and CSW following for discharge plan   01/15/19 Pt unable to tolerate TC - remains on vent with copious secretions..  Pts blood cultures are positive .  Pt currently receiving hydrotherapy.  Discharge disposition continues to be vent SNF once appropriate.

## 2019-01-22 NOTE — Progress Notes (Signed)
Occupational Therapy Treatment Patient Details Name: Antonio [name]. MRN: [id number] DOB: 1956/02/27 Today's Date: 01/22/2019    History of present illness 63 yo admitted with flu A and PNA with acute respiratory failure intubated 1/4, trach 1/16, PEG 1/28 with course complicated by acute MI, CHF and cardiogenic shock and mucous plugging. Transferred to ICU late 2/13/early 2/14 due to dyspnea and drop in sats with thick mucous.PMhx: DM, HTN, HLD, CAD, Lt BKA, rt toe amputation   OT comments  Pt progressing well. Able to ambulate and have BM on 3 in 1 over toilet and still had energy to walk in hall with chair following. Updated d/c to SNF.  Follow Up Recommendations  SNF;Supervision/Assistance - 24 hour(pt does not have support at home for CIR admission)    Equipment Recommendations  Other (comment)(defer to next venue)    Recommendations for Other Services      Precautions / Restrictions Precautions Precautions: Fall Precaution Comments: pt seen on trach collar Required Braces or Orthoses: Other Brace Other Brace: LLE prothesis, PEG, PMV (make sure cuff is deflated) Restrictions Weight Bearing Restrictions: No       Mobility Bed Mobility Overal bed mobility: Needs Assistance Bed Mobility: Supine to Sit     Supine to sit: Min guard     General bed mobility comments: for safety and line management  Transfers Overall transfer level: Needs assistance Equipment used: Rolling walker (2 wheeled) Transfers: Sit to/from Stand Sit to Stand: Min guard;From elevated surface         General transfer comment: from bed and 3 in1 over toilet, pt requesting therapist not to touch him as he stood    Balance Overall balance assessment: Needs assistance Sitting-balance support: Feet supported;No upper extremity supported;Bilateral upper extremity supported Sitting balance-Leahy Scale: Good       Standing balance-Leahy Scale: Poor                              ADL either performed or assessed with clinical judgement   ADL Overall ADL's : Needs assistance/impaired                 Upper Body Dressing : Sitting;Minimal assistance   Lower Body Dressing: Moderate assistance;Sitting/lateral leans Lower Body Dressing Details (indicate cue type and reason): pt donned prosthetic with min guad assist, max assist for R shoe and sock Toilet Transfer: Minimal assistance;Ambulation;BSC;RW;+2 for safety/equipment   Toileting- Clothing Manipulation and Hygiene: Total assistance;Sit to/from stand Toileting - Clothing Manipulation Details (indicate cue type and reason): after BM     Functional mobility during ADLs: Minimal assistance;+2 for safety/equipment;+2 for physical assistance;Rolling walker       Vision       Perception     Praxis      Cognition Arousal/Alertness: Awake/alert Behavior During Therapy: Flat affect Overall Cognitive Status: Impaired/Different from baseline Area of Impairment: Safety/judgement;Problem solving;Awareness                         Safety/Judgement: Decreased awareness of safety;Decreased awareness of deficits Awareness: Emergent Problem Solving: Difficulty sequencing;Requires verbal cues;Requires tactile cues          Exercises     Shoulder Instructions       General Comments      Pertinent Vitals/ Pain       Pain Assessment: No/denies pain  Home Living  Prior Functioning/Environment              Frequency  Min 2X/week        Progress Toward Goals  OT Goals(current goals can now be found in the care plan section)  Progress towards OT goals: Progressing toward goals  Acute Rehab OT Goals Patient Stated Goal: pt agreeable to working with therapies OT Goal Formulation: With patient Time For Goal Achievement: 01/28/19 Potential to Achieve Goals: Good  Plan Discharge plan needs to be updated     Co-evaluation    PT/OT/SLP Co-Evaluation/Treatment: Yes Reason for Co-Treatment: Complexity of the patient's impairments (multi-system involvement);For patient/therapist safety   OT goals addressed during session: ADL's and self-care      AM-PAC OT "6 Clicks" Daily Activity     Outcome Measure   Help from another person eating meals?: Total Help from another person taking care of personal grooming?: A Little Help from another person toileting, which includes using toliet, bedpan, or urinal?: A Lot Help from another person bathing (including washing, rinsing, drying)?: A Lot Help from another person to put on and taking off regular upper body clothing?: A Little Help from another person to put on and taking off regular lower body clothing?: A Lot 6 Click Score: 13    End of Session Equipment Utilized During Treatment: Rolling walker;Gait belt  OT Visit Diagnosis: Unsteadiness on feet (R26.81);Other abnormalities of gait and mobility (R26.89);Muscle weakness (generalized) (M62.81);Other symptoms and signs involving cognitive function   Activity Tolerance Patient tolerated treatment well   Patient Left in chair;with call bell/phone within reach;with chair alarm set   Nurse Communication Mobility status        Time: 3151-7616 OT Time Calculation (min): 35 min  Charges: OT General Charges $OT Visit: 1 Visit OT Treatments $Self Care/Home Management : 8-22 mins  Martie Round, OTR/L Acute Rehabilitation Services Pager: [phone] Office: [phone]   Evern Bio 01/22/2019, 4:32 PM

## 2019-01-22 NOTE — Progress Notes (Addendum)
3:28pm-CSW spoke with Janie from [facility] and was informed that they WILL NOT be taking pt. CSW to follow for further placement needs. At this time pt has no other offers and has received call denials.   1:59pm-CSW attempted to follow back up with Janie from [facility]-left voicamail at this time.   CSW spoke with [facility] was informed that pt is no longer eligible for CIR placement and is now in ned of trach snf. CSW reviewed chart and noticed that pt only has one offer from [facility] however [facility] admission looking into pt to ensure that they can take pt with this insurance. CSW awaits call back from [facility] admission before giving offers as this pt's only offer.     Claude Manges Graydon Fofana, MSW, LCSW-A Emergency Department Clinical Social Worker [phone]

## 2019-01-22 NOTE — Progress Notes (Signed)
Physical Therapy Treatment Patient Details Name: Antonio [name]. MRN: [id number] DOB: 08-25-1956 Today's Date: 01/22/2019    History of Present Illness 63 yo admitted with flu A and PNA with acute respiratory failure intubated 1/4, trach 1/16, PEG 1/28 with course complicated by acute MI, CHF and cardiogenic shock and mucous plugging. Transferred to ICU late 2/13/early 2/14 due to dyspnea and drop in sats with thick mucous.PMhx: DM, HTN, HLD, CAD, Lt BKA, rt toe amputation    PT Comments    Patient making great progress with therapy today. Now ambulating 50' in hallway with contact guard to min A, chair follow.  Continue to rec to CIR as  He is progressing well.     Follow Up Recommendations  CIR     Equipment Recommendations  Rolling walker with 5" wheels;3in1 (PT)    Recommendations for Other Services OT consult     Precautions / Restrictions Precautions Precautions: Fall Precaution Comments: pt seen on trach collar Required Braces or Orthoses: Other Brace Other Brace: LLE prothesis, PEG, PMV (make sure cuff is deflated) Restrictions Weight Bearing Restrictions: No LLE Weight Bearing: Weight bearing as tolerated    Mobility  Bed Mobility Overal bed mobility: Needs Assistance Bed Mobility: Supine to Sit     Supine to sit: Min guard     General bed mobility comments: for safety and line management  Transfers Overall transfer level: Needs assistance Equipment used: Rolling walker (2 wheeled) Transfers: Sit to/from Stand Sit to Stand: Min guard;From elevated surface         General transfer comment: from   Ambulation/Gait                 Stairs             Wheelchair Mobility    Modified Rankin (Stroke Patients Only)       Balance Overall balance assessment: Needs assistance Sitting-balance support: Feet supported;No upper extremity supported;Bilateral upper extremity supported Sitting balance-Leahy Scale: Good        Standing balance-Leahy Scale: Poor                              Cognition Arousal/Alertness: Awake/alert Behavior During Therapy: Flat affect Overall Cognitive Status: Impaired/Different from baseline Area of Impairment: Safety/judgement;Problem solving;Awareness                 Orientation Level: Disoriented to;Time Current Attention Level: Selective   Following Commands: Follows one step commands consistently Safety/Judgement: Decreased awareness of safety;Decreased awareness of deficits Awareness: Emergent Problem Solving: Difficulty sequencing;Requires verbal cues;Requires tactile cues        Exercises      General Comments        Pertinent Vitals/Pain Pain Assessment: No/denies pain    Home Living                      Prior Function            PT Goals (current goals can now be found in the care plan section) Acute Rehab PT Goals Patient Stated Goal: pt agreeable to working with therapies PT Goal Formulation: With patient Time For Goal Achievement: 02/02/19 Potential to Achieve Goals: Good Progress towards PT goals: Progressing toward goals    Frequency    Min 3X/week      PT Plan Current plan remains appropriate    Co-evaluation PT/OT/SLP Co-Evaluation/Treatment: Yes Reason for Co-Treatment: Complexity of the patient's impairments (  multi-system involvement);Necessary to address cognition/behavior during functional activity PT goals addressed during session: Mobility/safety with mobility OT goals addressed during session: ADL's and self-care      AM-PAC PT "6 Clicks" Mobility   Outcome Measure  Help needed turning from your back to your side while in a flat bed without using bedrails?: A Little Help needed moving from lying on your back to sitting on the side of a flat bed without using bedrails?: A Lot Help needed moving to and from a bed to a chair (including a wheelchair)?: A Little Help needed standing up from a  chair using your arms (e.g., wheelchair or bedside chair)?: A Little Help needed to walk in hospital room?: A Little Help needed climbing 3-5 steps with a railing? : A Lot 6 Click Score: 16    End of Session Equipment Utilized During Treatment: Gait belt;Oxygen;Other (comment) Activity Tolerance: Patient tolerated treatment well Patient left: in chair;with call bell/phone within reach;with chair alarm set;Other (comment) Nurse Communication: Mobility status PT Visit Diagnosis: Muscle weakness (generalized) (M62.81);Difficulty in walking, not elsewhere classified (R26.2)     Time: 1526-1600 PT Time Calculation (min) (ACUTE ONLY): 34 min  Charges:  $Gait Training: 8-22 mins                     Etta Grandchild, PT, DPT Acute Rehabilitation Services Pager: [phone] Office: [phone]     Etta Grandchild 01/22/2019, 5:37 PM

## 2019-01-23 ENCOUNTER — Inpatient Hospital Stay (HOSPITAL_COMMUNITY): Payer: Medicaid Other

## 2019-01-23 LAB — GLUCOSE, CAPILLARY
Glucose-Capillary: 111 mg/dL — ABNORMAL HIGH (ref 70–99)
Glucose-Capillary: 133 mg/dL — ABNORMAL HIGH (ref 70–99)
Glucose-Capillary: 134 mg/dL — ABNORMAL HIGH (ref 70–99)
Glucose-Capillary: 141 mg/dL — ABNORMAL HIGH (ref 70–99)
Glucose-Capillary: 147 mg/dL — ABNORMAL HIGH (ref 70–99)
Glucose-Capillary: 159 mg/dL — ABNORMAL HIGH (ref 70–99)
Glucose-Capillary: 99 mg/dL (ref 70–99)

## 2019-01-23 NOTE — Progress Notes (Signed)
  Speech Language Pathology Treatment: [name] Speaking valve  Patient Details Name: Antonio [name]. MRN: [id number] DOB: [birth year] Today's Date: 01/23/2019 Time: 1030-1040 SLP Time Calculation (min) (ACUTE ONLY): 10 min  Assessment / Plan / Recommendation Clinical Impression  Pt had his PMV in place upon SLP arrival and was visiting with his family. He requested that his PMV be removed because he was concerned that it would cause him "soreness" if he left it on for too long. He denies any current pain or trouble breathing, and he had no evidence of air trapping. His voice remains moderately hoarse. Recommend to continue use with intermittent supervision as tolerated throughout the day. Valve was left in place upon SLP departure with pt's parents present. RN also made aware.   HPI HPI: 63 year old male coming in with fever cough chest pain and shortness of breath was found to have influenza A. PMH: diabetes mellitus, hypertension.  Intubated multiple times 1/3 to 1/16 with trach placed 1/16.  Currently with size 6 Cuffless trach with plans to downsize to #4 and work toward decannulation.       SLP Plan  Continue with current plan of care       Recommendations         Patient may use Passy-Muir Speech Valve: Intermittently with supervision;During all therapies with supervision PMSV Supervision: Intermittent MD: Please consider changing trach tube to : Cuffless         Oral Care Recommendations: Oral care QID Follow up Recommendations: Inpatient Rehab SLP Visit Diagnosis: Aphonia (R49.1) Plan: Continue with current plan of care       GO                Virl Axe Anthem Frazer 01/23/2019, 10:59 AM  [name], M.A. CCC-SLP Acute Herbalist [phone] Office [phone]

## 2019-01-23 NOTE — Progress Notes (Signed)
CSW updated Wandra Mannan of pt's case regarding placement at this time  CSW awaits response back from [facility] to see what other option pt may have in being placed on the difficulty to place list at this time.    Claude Manges Kentavious Michele, MSW, LCSW-A Emergency Department Clinical Social Worker [phone]

## 2019-01-23 NOTE — Progress Notes (Signed)
CSW has reached out to [facility] and [facility] for possible SNF placement for the patient. CSW is awaiting responses from both facilities.   CSW will continue to follow.   Drucilla Schmidt, MSW, LCSW-A Clinical Social Worker [facility]

## 2019-01-23 NOTE — Progress Notes (Signed)
CSW heard back [facility] in [place] and [facility] in [place]. Both facilities are unable to take the patient at this time.   CSW will continue to look for a facility.   Drucilla Schmidt, MSW, LCSW-A Clinical Social Worker [facility]

## 2019-01-23 NOTE — Evaluation (Signed)
Clinical/Bedside Swallow Evaluation Patient Details  Name: Antonio [name]. MRN: [id number] Date of Birth: 05-02-1956  Today's Date: 01/23/2019 Time: SLP Start Time (ACUTE ONLY): 1040 SLP Stop Time (ACUTE ONLY): 1050 SLP Time Calculation (min) (ACUTE ONLY): 10 min  Past Medical History:  Past Medical History:  Diagnosis Date  . Coronary artery disease   . Diabetes mellitus without complication (HCC)   . Hyperlipidemia   . Hypertension   . Osteomyelitis (HCC) 03/25/2017   RT FOOT   Past Surgical History:  Past Surgical History:  Procedure Laterality Date  . AMPUTATION Right 03/27/2017   Procedure: 1st Ray Amputation Right Foot;  Surgeon: Nadara Mustard, MD;  Location: [facility] OR;  Service: Orthopedics;  Laterality: Right;  . APPENDECTOMY    . BELOW KNEE LEG AMPUTATION Left   . CARDIAC CATHETERIZATION    . CORONARY STENT INTERVENTION  2005  . Great toe amputation right.    . I&D EXTREMITY Left 12/30/2015   Procedure: IRRIGATION AND DEBRIDEMENT LEFT FOOT, TRANSMETATARSAL AMPUTATION WITH APPLICATION OF ANTIBIOTIC BEADS AND WOUND VAC;  Surgeon: Nadara Mustard, MD;  Location: [facility] OR;  Service: Orthopedics;  Laterality: Left;  . IR GASTROSTOMY TUBE MOD SED  12/23/2018  . TONSILLECTOMY    . TRACHEOSTOMY  11/2018   HPI:  63 year old male coming in with fever cough chest pain and shortness of breath was found to have influenza A. PMH: diabetes mellitus, hypertension.  Intubated multiple times 1/3 to 1/16 with trach placed 1/16.     Assessment / Plan / Recommendation Clinical Impression  Pt's voice is moderately hoarse and his cough appears weak. He subjectively agrees, saying that he can never "cough anything up." He does have multiple subswallows and intermittent coughing with ice chip trials. Recommend proceeding with MBS, which can be completed this afternoon, to better assess oropharyngeal function. Suspect that aspiration risk remains high, but testing can determine if there are any safe  POs to attempt and can facilitate additional dysphagia treatment. Would maintain NPO status in the interim. SLP Visit Diagnosis: Dysphagia, unspecified (R13.10)    Aspiration Risk  Moderate aspiration risk    Diet Recommendation NPO   Medication Administration: Via alternative means    Other  Recommendations Oral Care Recommendations: Oral care QID Other Recommendations: Have oral suction available   Follow up Recommendations Inpatient Rehab      Frequency and Duration            Prognosis Prognosis for Safe Diet Advancement: Good      Swallow Study   General HPI: 63 year old male coming in with fever cough chest pain and shortness of breath was found to have influenza A. PMH: diabetes mellitus, hypertension.  Intubated multiple times 1/3 to 1/16 with trach placed 1/16.   Type of Study: Bedside Swallow Evaluation Previous Swallow Assessment: BSE on 1/8 Diet Prior to this Study: NPO;PEG tube Temperature Spikes Noted: No Respiratory Status: Nasal cannula Trach Size and Type: Cuff;Deflated;With PMSV in place(5 mm) History of Recent Intubation: Yes Length of Intubations (days): 13 days Date extubated: (trach 1/16) Behavior/Cognition: Alert;Cooperative Oral Cavity Assessment: Within Functional Limits Oral Care Completed by SLP: Yes Oral Cavity - Dentition: Missing dentition Patient Positioning: Upright in bed Baseline Vocal Quality: Hoarse Volitional Cough: Weak Volitional Swallow: Able to elicit    Oral/Motor/Sensory Function Overall Oral Motor/Sensory Function: Within functional limits   Ice Chips Ice chips: Impaired Presentation: Spoon Pharyngeal Phase Impairments: Cough - Delayed   Thin Liquid Thin Liquid: Not tested  Nectar Thick Nectar Thick Liquid: Not tested   Honey Thick Honey Thick Liquid: Not tested   Puree Puree: Not tested   Solid     Solid: Not tested      Virl Axe Damilola Flamm 01/23/2019,11:07 AM  [name], M.A. CCC-SLP Acute Scientist, clinical (histocompatibility and immunogenetics) [phone] Office [phone]

## 2019-01-23 NOTE — Progress Notes (Signed)
Modified Barium Swallow Progress Note  Patient Details  Name: Antonio [name]. MRN: [id number] Date of Birth: 14-Mar-1956  Today's Date: 01/23/2019  Modified Barium Swallow completed.  Full report located under Chart Review in the Imaging Section.  Brief recommendations include the following:  Clinical Impression  Pt has a moderate pharyngeal dysphagia due to reduced strength and airway closure s/p prolonged (and repeated) intubations. His timing for swallow trigger is good, but he has reduced hyolaryngeal movement, pharyngeal squeeze, epiglottic inversion, and tonge base retraction. Thin and nectar thick liquids enter the laryngeal vestibule during the swallow because of his incomplete closure, with penetration intermittently reaching the true vocal folds without spontaneous cough elicited. Multiple cued coughs were required to eject penetrates. Honey thick liquids and purees left more residue in the valleculae and pyriform sinuses, with intermittent penetration after the swallow from the residual material. Recommend he remain NPO except for initiating small amounts of purees and spoonfuls of thickened liquids during SLP visits only to facilitate restrengthening. Pt may also benefit from EMST and other exercises to progress strength of cough and pharyngeal musculature.     Swallow Evaluation Recommendations       SLP Diet Recommendations: NPO       Medication Administration: Via alternative means               Oral Care Recommendations: Oral care QID   Other Recommendations: Have oral suction available    Virl Axe Cecilia Nishikawa 01/23/2019,2:31 PM   [name], M.A. CCC-SLP Acute Herbalist [phone] Office [phone]

## 2019-01-23 NOTE — Progress Notes (Signed)
CSW faxed patient out to [facility].   CSW met with the patient and his parents at bedside along with RNCM. Provided current updates regarding potential SNF placements to the family.   CSW will need to continue to follow.   Domenic Schwab, MSW, [place]

## 2019-01-23 NOTE — Progress Notes (Signed)
Antonio [name] [place].  FXJ:[id number] DOB: [birth year] DOA: 11/28/2018 PCP: Imagene Riches, NP    Brief Narrative:  63yo male with influenza A causing respiratory failure and cardiogenic shock. Prolonged mechanical ventilation, tracheostomy placed on January 16.  Significant Events:  1/6 self extubated  1/7 re-intubated  1/8 self extubated 1/9 acute respiratory failure, mucus plugging right lower lobe, re-intubated, bronchoscopy 1/10 New fever T Max 103.1, antibiotics resumed with ceftaz and vancomycin 1/11 Hemodynamically seems to be improving. Very hypernatremic. X-ray improving. Adding free water, one-time Lasix, follow-up chemistry a.m. Hoping to initiate weaning efforts on 1/12 1/16 Continuing diuresis and ongoing ventilatory support. Working on weaning but still has significant work of breathing. Plan has been to proceed with tracheostomy. Hopefully we can continue ongoing diuresis and continue to decrease sedation 1/20 still struggling to wean. Secretions and anxiety are barriers.  1/21 trach revision to 6 distal XLT, fever, increased secretions, abx broadened / bronch 1/22 ongoing thick secretions 1/24 Failed SBT, low grade fever, Per CXR persistent pleural effusions / basilar consolidation atelectasis vs pneumonia per bases 1/25 Mucus plugged with slow recovery after chest PT and Wound Care 2/3  first time weaned for greater than 15 minutes (previously failed due to agitation, secretions, tachypnea) 2/10 TRH assumed care 2/11 trach changed to a #5 cuffless per PCCM  2/13 trach changed to #4 shiley cuffless  2/13 afternoon - pulled out new trach and "disoldged" PEG - both reinserted/repositioned  2/13 PM > 2/14 AM acute hypoxic failure - emergent bedside bronch > trach obstructed - #6 cuffed trach placed > back to ICU on vent  2/14 trach changed to #5 cuffed 2/15 TRH resumed care - resp distress > back on vent  2/15  Failed ATC with  increased WOB 2/16  Failed ATC with increased WOB  2/21 back on pressure support 2/24 back on TC   Subjective: Pt looks signif improved since the last time I saw him. He denise cp, n/v, abdom pain, or sob. He is alert and oriented.   Assessment & Plan:  Acute hypoxic respiratory failure - Influenza A / Corynebacterium pneumonia - Prolonged respiratory failure  Ongoing care of trach per PCCM - appears to be stabilizing at this time - stable on TC only - to complete 14 days of Ceftaz  Acute systolic heart failure no plans for cardiac cath per Cardiology - EF 20-25% via TTE 11/29/18, but improved to 60-65% on f/u 1/29 - likely due to sepsis - cont BB and ARB Filed Weights   01/21/19 0417 01/22/19 0500 01/23/19 0500  Weight: 90.4 kg 90.8 kg 91.8 kg    Nutrition status post PEG tube by IR - cont tube feeds - tolerating well   Acute metabolic encephalopathy Now appears to be stabilizing   DM2 CBG variable - follow w/o change for now   Deconditioning Awaiting placement in long term rehab   Sacral decub Per wound care - General Surgery consulted on 2/21> no need for surgical debridement  DVT prophylaxis: lovenox  Code Status: FULL CODE Family Communication: no family present at time of exam  Disposition Plan: stable for SDU bed   Consultants:  PCCM Cardiology   Antimicrobials:  ceftaz 2/20 >  Objective: Blood pressure 135/80, pulse 65, temperature 97.7 F (36.5 C), temperature source Oral, resp. rate 17, height 6' (1.829 m), weight 91.8 kg, SpO2 100 %.  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  Liver Function Tests: No results for input(s): AST, ALT, ALKPHOS, BILITOT, PROT, ALBUMIN in the last 168 hours.   HbA1C: Hgb A1c MFr Bld  Date/Time Value Ref Range Status  01/21/2019 04:01 AM 7.3 (H) 4.8 - 5.6 % Final    Comment:    (NOTE) Pre diabetes:          5.7%-6.4% Diabetes:              >6.4% Glycemic control for   <7.0% adults with diabetes   03/25/2017 05:59 AM 12.3 (H) 4.8 - 5.6 % Final    Comment:    (NOTE)         Pre-diabetes: 5.7 - 6.4         Diabetes: >6.4         Glycemic control for adults with diabetes: <7.0     CBG: Recent Labs  Lab 01/22/19 1111 01/22/19 1520 01/22/19 1941 01/23/19 0011 01/23/19 0427  GLUCAP 153* 129* 116* 147* 141*    Recent Results (from the past 240 hour(s))  Culture, respiratory     Status: None   Collection Time: 01/13/19 11:53 AM  Result Value Ref Range Status   Specimen Description SPUTUM  Final   Special Requests NONE  Final   Gram Stain   Final    FEW WBC PRESENT, PREDOMINANTLY PMN FEW GRAM NEGATIVE RODS Performed at [facility], [address], [place], [place] [address]    Culture MODERATE PSEUDOMONAS AERUGINOSA  Final   Report Status 01/15/2019 FINAL  Final   Organism ID, Bacteria PSEUDOMONAS AERUGINOSA  Final      Susceptibility   Pseudomonas aeruginosa - MIC*     CEFTAZIDIME 4 SENSITIVE Sensitive     CIPROFLOXACIN <=0.25 SENSITIVE Sensitive     GENTAMICIN <=1 SENSITIVE Sensitive     IMIPENEM 2 SENSITIVE Sensitive     PIP/TAZO <=4 SENSITIVE Sensitive     CEFEPIME 2 SENSITIVE Sensitive     * MODERATE PSEUDOMONAS AERUGINOSA     Scheduled Meds: . arformoterol  15 mcg Nebulization BID  . aspirin  81 mg Per Tube Daily  . atorvastatin  40 mg Per Tube q1800  . budesonide (PULMICORT) nebulizer solution  0.5 mg Nebulization BID  . carvedilol  3.125 mg  Per Tube BID WC  . chlorhexidine gluconate (MEDLINE KIT)  15 mL Mouth Rinse BID  . collagenase   Topical Daily  . enoxaparin (LOVENOX) injection  40 mg Subcutaneous Q24H  . famotidine  20 mg Per Tube BID  . fentaNYL  1 patch Transdermal Q72H  . folic acid  1 mg Per Tube Daily  . free water  100 mL Per Tube Q8H  . guaiFENesin  5 mL Per Tube Q12H  . insulin aspart  0-20 Units Subcutaneous Q4H  . insulin detemir  35 Units Subcutaneous BID  . ipratropium-albuterol  3 mL Nebulization TID  . mouth rinse  15 mL Mouth Rinse QID  . multivitamin  15 mL Per Tube Daily  . nutrition supplement (JUVEN)  1 packet Per Tube BID BM  . QUEtiapine  25 mg Per Tube BID  . tapentadol  50 mg Oral QID  . thiamine  100 mg Per Tube Daily     LOS: 56 days   Cherene Altes, MD [facility] Office  [phone] Pager - Text Page per Amion  If 7PM-7AM, please contact night-coverage per Amion 01/23/2019, 9:24 AM

## 2019-01-23 NOTE — Progress Notes (Signed)
Physical Therapy Wound Treatment Patient Details  Name: Antonio [name]. MRN: [id number] Date of Birth: January 25, 1956  Today's Date: 01/23/2019 Time: 4585-9292 Time Calculation (min): 34 min  Subjective  Subjective: Pt asking to see a picture of the wound Patient and Family Stated Goals: Pt on trach collar Date of Onset: (unknown) Prior Treatments: dressing changes  Pain Score:  Pain with debridement; limited within tolerance and premedicated  Wound Assessment  Pressure Injury 12/01/18 Unstageable - Full thickness tissue loss in which the base of the ulcer is covered by slough (yellow, tan, gray, green or brown) and/or eschar (tan, brown or black) in the wound bed. sacrum (Active)  Dressing Type Barrier Film (skin prep);ABD;Moist to moist;Gauze (Comment);Other (Comment) 01/23/2019  4:30 PM  Dressing Clean;Dry;Intact;Changed 01/23/2019  4:30 PM  Dressing Change Frequency Daily 01/23/2019  4:30 PM  State of Healing Early/partial granulation 01/23/2019  4:30 PM  Site / Wound Assessment Red;Yellow;Pink 01/23/2019  4:30 PM  % Wound base Red or Granulating 60% 01/23/2019  4:30 PM  % Wound base Yellow/Fibrinous Exudate 40% 01/23/2019  4:30 PM  % Wound base Black/Eschar 0% 01/23/2019  4:30 PM  % Wound base Other/Granulation Tissue (Comment) 0% 01/23/2019  4:30 PM  Peri-wound Assessment Erythema (blanchable) 01/23/2019  4:30 PM  Wound Length (cm) 9 cm 01/21/2019  4:22 PM  Wound Width (cm) 5 cm 01/21/2019  4:22 PM  Wound Depth (cm) 1 cm 01/21/2019  4:22 PM  Wound Surface Area (cm^2) 45 cm^2 01/21/2019  4:22 PM  Wound Volume (cm^3) 45 cm^3 01/21/2019  4:22 PM  Tunneling (cm) 0 12/31/2018 10:28 AM  Margins Unattached edges (unapproximated) 01/23/2019  4:30 PM  Drainage Amount Moderate 01/23/2019  4:30 PM  Drainage Description Purulent;Serosanguineous 01/23/2019  4:30 PM  Treatment Debridement (Selective);Hydrotherapy (Pulse lavage);Packing (Saline gauze) 01/23/2019  4:30 PM      Hydrotherapy Pulsed lavage  therapy - wound location: sacrum Pulsed Lavage with Suction (psi): 8 psi(8-12) Pulsed Lavage with Suction - Normal Saline Used: 1000 mL Pulsed Lavage Tip: Tip with splash shield Selective Debridement Selective Debridement - Location: sacrum Selective Debridement - Tools Used: Forceps;Scalpel Selective Debridement - Tissue Removed: yellow necrotic tissue   Wound Assessment and Plan  Wound Therapy - Assess/Plan/Recommendations Wound Therapy - Clinical Statement: Minimal nonviable tissue removed on opening in superior portion due to increased sanguineous drainage. Continue hydrotherapy for removal of necrotic tissue.  Wound Therapy - Functional Problem List: decr mobility Factors Delaying/Impairing Wound Healing: Diabetes Mellitus;Immobility;Multiple medical problems Hydrotherapy Plan: Debridement;Dressing change;Patient/family education;Pulsatile lavage with suction Wound Therapy - Frequency: 3X / week Wound Therapy - Follow Up Recommendations: Skilled nursing facility Wound Plan: see above  Wound Therapy Goals- Improve the function of patient's integumentary system by progressing the wound(s) through the phases of wound healing (inflammation - proliferation - remodeling) by: Decrease Necrotic Tissue to: 30 Decrease Necrotic Tissue - Progress: Progressing toward goal Increase Granulation Tissue to: 70 Increase Granulation Tissue - Progress: Progressing toward goal Goals/treatment plan/discharge plan were made with and agreed upon by patient/family: Yes Time For Goal Achievement: 7 days Wound Therapy - Potential for Goals: Fair  Goals will be updated until maximal potential achieved or discharge criteria met.  Discharge criteria: when goals achieved, discharge from hospital, MD decision/surgical intervention, no progress towards goals, refusal/missing three consecutive treatments without notification or medical reason.  GP    [name], PT, DPT Acute Rehabilitation Services Pager  [phone] Office [phone]   [name] 01/23/2019, 4:33 PM

## 2019-01-24 LAB — BASIC METABOLIC PANEL
Anion gap: 6 (ref 5–15)
BUN: 20 mg/dL (ref 6–20)
CO2: 32 mmol/L (ref 22–32)
Calcium: 8.8 mg/dL — ABNORMAL LOW (ref 8.9–10.3)
Chloride: 98 mmol/L (ref 98–111)
Creatinine, Ser: 0.43 mg/dL — ABNORMAL LOW (ref 0.61–1.24)
GFR calc Af Amer: >60 mL/min (ref >60)
Glucose, Bld: 166 mg/dL — ABNORMAL HIGH (ref 70–99)
Potassium: 4.1 mmol/L (ref 3.5–5.1)
Sodium: 136 mmol/L (ref 135–145)

## 2019-01-24 LAB — CBC
HCT: 30.6 % — ABNORMAL LOW (ref 39.0–52.0)
Hemoglobin: 9.2 g/dL — ABNORMAL LOW (ref 13.0–17.0)
MCH: 26.7 pg (ref 26.0–34.0)
MCHC: 30.1 g/dL (ref 30.0–36.0)
MCV: 88.7 fL (ref 80.0–100.0)
Platelets: 199 10*3/uL (ref 150–400)
RBC: 3.45 MIL/uL — ABNORMAL LOW (ref 4.22–5.81)
RDW: 17 % — ABNORMAL HIGH (ref 11.5–15.5)
WBC: 5.1 10*3/uL (ref 4.0–10.5)
nRBC: 0 % (ref 0.0–0.2)

## 2019-01-24 LAB — GLUCOSE, CAPILLARY
GLUCOSE-CAPILLARY: 146 mg/dL — AB (ref 70–99)
GLUCOSE-CAPILLARY: 158 mg/dL — AB (ref 70–99)
Glucose-Capillary: 149 mg/dL — ABNORMAL HIGH (ref 70–99)
Glucose-Capillary: 110 mg/dL — ABNORMAL HIGH (ref 70–99)
Glucose-Capillary: 134 mg/dL — ABNORMAL HIGH (ref 70–99)
Glucose-Capillary: 112 mg/dL — ABNORMAL HIGH (ref 70–99)

## 2019-01-24 LAB — MAGNESIUM: MAGNESIUM: 1.8 mg/dL (ref 1.7–2.4)

## 2019-01-24 LAB — PHOSPHORUS: Phosphorus: 3.7 mg/dL (ref 2.5–4.6)

## 2019-01-24 MED ORDER — CHLORHEXIDINE GLUCONATE 0.12 % MT SOLN
OROMUCOSAL | Status: AC
  Filled 2019-01-24: qty 15

## 2019-01-24 MED ORDER — WHITE PETROLATUM EX OINT
TOPICAL_OINTMENT | CUTANEOUS | Status: AC
  Filled 2019-01-24: qty 28.35

## 2019-01-24 MED ORDER — OXYCODONE HCL 5 MG PO TABS
5.0000 mg | ORAL_TABLET | ORAL | Status: DC | PRN
  Administered 2019-01-24: 5 mg via ORAL
  Administered 2019-01-24 – 2019-01-25 (×4): 10 mg via ORAL
  Administered 2019-01-25: 5 mg via ORAL
  Administered 2019-01-26 (×3): 10 mg via ORAL
  Administered 2019-01-26: 5 mg via ORAL
  Administered 2019-01-26 – 2019-01-27 (×4): 10 mg via ORAL
  Administered 2019-01-27: 5 mg via ORAL
  Administered 2019-01-28: 10 mg via ORAL
  Administered 2019-01-28 (×2): 5 mg via ORAL
  Administered 2019-01-28: 10 mg via ORAL
  Administered 2019-01-29 (×2): 5 mg via ORAL
  Administered 2019-01-29: 10 mg via ORAL
  Administered 2019-01-29 – 2019-02-07 (×21): 5 mg via ORAL
  Administered 2019-02-07 – 2019-02-16 (×35): 10 mg via ORAL
  Administered 2019-02-16: 5 mg via ORAL
  Administered 2019-02-17 – 2019-02-19 (×14): 10 mg via ORAL
  Administered 2019-02-19: 5 mg via ORAL
  Administered 2019-02-19 – 2019-02-20 (×7): 10 mg via ORAL
  Filled 2019-01-24 (×7): qty 2
  Filled 2019-01-24 (×3): qty 1
  Filled 2019-01-24 (×4): qty 2
  Filled 2019-01-24: qty 1
  Filled 2019-01-24: qty 2
  Filled 2019-01-24: qty 1
  Filled 2019-01-24 (×4): qty 2
  Filled 2019-01-24: qty 1
  Filled 2019-01-24 (×5): qty 2
  Filled 2019-01-24 (×2): qty 1
  Filled 2019-01-24: qty 2
  Filled 2019-01-24 (×2): qty 1
  Filled 2019-01-24 (×2): qty 2
  Filled 2019-01-24 (×3): qty 1
  Filled 2019-01-24: qty 2
  Filled 2019-01-24: qty 1
  Filled 2019-01-24 (×2): qty 2
  Filled 2019-01-24: qty 1
  Filled 2019-01-24 (×2): qty 2
  Filled 2019-01-24: qty 1
  Filled 2019-01-24 (×2): qty 2
  Filled 2019-01-24: qty 1
  Filled 2019-01-24 (×2): qty 2
  Filled 2019-01-24 (×4): qty 1
  Filled 2019-01-24: qty 2
  Filled 2019-01-24: qty 1
  Filled 2019-01-24 (×5): qty 2
  Filled 2019-01-24: qty 1
  Filled 2019-01-24 (×2): qty 2
  Filled 2019-01-24: qty 1
  Filled 2019-01-24 (×3): qty 2
  Filled 2019-01-24: qty 1
  Filled 2019-01-24 (×12): qty 2
  Filled 2019-01-24 (×2): qty 1
  Filled 2019-01-24 (×18): qty 2
  Filled 2019-01-24 (×2): qty 1
  Filled 2019-01-24: qty 2

## 2019-01-24 MED ORDER — TRAMADOL HCL 50 MG PO TABS
50.0000 mg | ORAL_TABLET | Freq: Four times a day (QID) | ORAL | Status: DC | PRN
  Administered 2019-01-24 – 2019-01-29 (×4): 50 mg via ORAL
  Filled 2019-01-24 (×3): qty 1

## 2019-01-24 NOTE — Progress Notes (Signed)
CPT not performed at this time, pt stated he just wanted to rest.

## 2019-01-24 NOTE — Progress Notes (Signed)
Pt resting comfortably in bed on 28% Trach Collar.  Denies any sob/resp distress.  Pt refused suctioning at this time.  Will cont to monitor.

## 2019-01-24 NOTE — Progress Notes (Signed)
[place] TE[facility] [place].  WGY:[id number] DOB: 08-Sep-1956 DOA: 11/28/2018 PCP: Imagene Riches, NP    Brief Narrative:  63yo male with influenza A causing respiratory failure and cardiogenic shock. Prolonged mechanical ventilation, tracheostomy placed on January 16.  Significant Events:  1/6 self extubated  1/7 re-intubated  1/8 self extubated 1/9 acute respiratory failure, mucus plugging right lower lobe, re-intubated, bronchoscopy 1/10 New fever T Max 103.1, antibiotics resumed with ceftaz and vancomycin 1/11 Hemodynamically seems to be improving. Very hypernatremic. X-ray improving. Adding free water, one-time Lasix, follow-up chemistry a.m. Hoping to initiate weaning efforts on 1/12 1/16 Continuing diuresis and ongoing ventilatory support. Working on weaning but still has significant work of breathing. Plan has been to proceed with tracheostomy. Hopefully we can continue ongoing diuresis and continue to decrease sedation 1/20 still struggling to wean. Secretions and anxiety are barriers.  1/21 trach revision to 6 distal XLT, fever, increased secretions, abx broadened / bronch 1/22 ongoing thick secretions 1/24 Failed SBT, low grade fever, Per CXR persistent pleural effusions / basilar consolidation atelectasis vs pneumonia per bases 1/25 Mucus plugged with slow recovery after chest PT and Wound Care 2/3  first time weaned for greater than 15 minutes (previously failed due to agitation, secretions, tachypnea) 2/10 TRH assumed care 2/11 trach changed to a #5 cuffless per PCCM  2/13 trach changed to #4 shiley cuffless  2/13 afternoon - pulled out new trach and "disoldged" PEG - both reinserted/repositioned  2/13 PM > 2/14 AM acute hypoxic failure - emergent bedside bronch > trach obstructed - #6 cuffed trach placed > back to ICU on vent  2/14 trach changed to #5 cuffed 2/15 TRH resumed care - resp distress > back on vent  2/15  Failed ATC with  increased WOB 2/16  Failed ATC with increased WOB  2/21 back on pressure support 2/24 back on TC   Subjective: Had trouble sleeping last night. Denies cp, n/v, or abdom pain. C/o pain in his back side. Is motivated to continue rehab.   Assessment & Plan:  Acute hypoxic respiratory failure - Influenza A / Corynebacterium pneumonia - Prolonged respiratory failure  Ongoing care of trach per PCCM - appears to be stabilizing at this time - stable on TC only - to complete 14 days of Ceftaz  Acute systolic heart failure no plans for cardiac cath per Cardiology - EF 20-25% via TTE 11/29/18, but improved to 60-65% on f/u 1/29 - likely due to sepsis - cont BB and ARB - euvolemic at this time  [facility] Weights   01/21/19 0417 01/22/19 0500 01/23/19 0500  Weight: 90.4 kg 90.8 kg 91.8 kg    Nutrition status post PEG tube by IR - cont tube feeds - tolerating well   Acute metabolic encephalopathy Appears essentially resolved at this time   DM2 CBG variable - follow w/o change for now   Deconditioning Awaiting placement in long term rehab - medically stable for d/c when bed available     Sacral decub Per wound care - General Surgery consulted on 2/21> no need for surgical debridement  DVT prophylaxis: lovenox  Code Status: FULL CODE Family Communication: no family present at time of exam  Disposition Plan: stable for SDU bed   Consultants:  PCCM Cardiology   Antimicrobials:  Ceftaz 2/20 >  Objective: Blood pressure 129/67, pulse 68, temperature 98.3 F (36.8 C), temperature source Oral, resp. rate (!) 22, height 6' (1.829 m), weight 91.8 kg, SpO2  97 %.  Intake/Output Summary (Last 24 hours) at 01/24/2019 0949 Last data filed at 01/24/2019 0800 Gross per 24 hour  Intake 2092.75 ml  Output 2350 ml  Net -257.25 ml   Filed Weights   01/21/19 0417 01/22/19 0500 01/23/19 0500  Weight: 90.4 kg 90.8 kg 91.8 kg    Examination: General: no acute distress on TC - alert and oriented    Lungs: CTA th/o - no wheezing  Cardiovascular: RRR w/o M or rub  Abdomen: NT/ND, soft, bs+, no mass - PEG insertion clean and dry  Extremities: No edema R LE - L BKA   CBC: Recent Labs  Lab 01/18/19 0207 01/19/19 0303 01/20/19 0401 01/24/19 0324  WBC 7.1 6.5 6.5 5.1  NEUTROABS 4.1 3.8 3.8  --   HGB 10.1* 9.9* 9.4* 9.2*  HCT 33.7* 32.9* 30.0* 30.6*  MCV 89.4 89.4 88.8 88.7  PLT 249 264 227 009   Basic Metabolic Panel: Recent Labs  Lab 01/20/19 0401 01/21/19 0254 01/24/19 0324  NA 136 136 136  K 4.3 4.0 4.1  CL 99 99 98  CO2 30 29 32  GLUCOSE 131* 162* 166*  BUN 27* 25* 20  CREATININE 0.57* 0.54* 0.43*  CALCIUM 8.5* 8.6* 8.8*  MG 1.7  --  1.8  PHOS  --   --  3.7   GFR: Estimated Creatinine Clearance: 107.8 mL/min (A) (by C-G formula based on SCr of 0.43 mg/dL (L)).  Liver Function Tests: No results for input(s): AST, ALT, ALKPHOS, BILITOT, PROT, ALBUMIN in the last 168 hours.   HbA1C: Hgb A1c MFr Bld  Date/Time Value Ref Range Status  01/21/2019 04:01 AM 7.3 (H) 4.8 - 5.6 % Final    Comment:    (NOTE) Pre diabetes:          5.7%-6.4% Diabetes:              >6.4% Glycemic control for   <7.0% adults with diabetes   03/25/2017 05:59 AM 12.3 (H) 4.8 - 5.6 % Final    Comment:    (NOTE)         Pre-diabetes: 5.7 - 6.4         Diabetes: >6.4         Glycemic control for adults with diabetes: <7.0     CBG: Recent Labs  Lab 01/23/19 1513 01/23/19 1936 01/23/19 2333 01/24/19 0345 01/24/19 0738  GLUCAP 99 133* 111* 146* 158*     Scheduled Meds: . arformoterol  15 mcg Nebulization BID  . aspirin  81 mg Per Tube Daily  . atorvastatin  40 mg Per Tube q1800  . budesonide (PULMICORT) nebulizer solution  0.5 mg Nebulization BID  . carvedilol  3.125 mg Per Tube BID WC  . chlorhexidine gluconate (MEDLINE KIT)  15 mL Mouth Rinse BID  . collagenase   Topical Daily  . enoxaparin (LOVENOX) injection  40 mg Subcutaneous Q24H  . famotidine  20 mg Per Tube  BID  . fentaNYL  1 patch Transdermal Q72H  . folic acid  1 mg Per Tube Daily  . free water  100 mL Per Tube Q8H  . guaiFENesin  5 mL Per Tube Q12H  . insulin aspart  0-20 Units Subcutaneous Q4H  . insulin detemir  35 Units Subcutaneous BID  . ipratropium-albuterol  3 mL Nebulization TID  . mouth rinse  15 mL Mouth Rinse QID  . multivitamin  15 mL Per Tube Daily  . nutrition supplement (JUVEN)  1 packet Per  Tube BID BM  . tapentadol  50 mg Oral QID  . thiamine  100 mg Per Tube Daily  . white petrolatum         LOS: 78 days   Cherene Altes, MD [facility] Office  [phone] Pager - Text Page per Amion  If 7PM-7AM, please contact night-coverage per Amion 01/24/2019, 9:49 AM

## 2019-01-25 LAB — GLUCOSE, CAPILLARY
Glucose-Capillary: 104 mg/dL — ABNORMAL HIGH (ref 70–99)
Glucose-Capillary: 112 mg/dL — ABNORMAL HIGH (ref 70–99)
Glucose-Capillary: 130 mg/dL — ABNORMAL HIGH (ref 70–99)
Glucose-Capillary: 135 mg/dL — ABNORMAL HIGH (ref 70–99)
Glucose-Capillary: 135 mg/dL — ABNORMAL HIGH (ref 70–99)

## 2019-01-25 NOTE — Plan of Care (Signed)
  Problem: Activity: Goal: Ability to tolerate increased activity will improve Outcome: Completed/Met   Problem: Respiratory: Goal: Ability to maintain a clear airway and adequate ventilation will improve Outcome: Completed/Met   Problem: Role Relationship: Goal: Method of communication will improve Outcome: Completed/Met   Problem: Education: Goal: Knowledge about tracheostomy care/management will improve Outcome: Progressing   Problem: Activity: Goal: Ability to tolerate increased activity will improve Outcome: Progressing

## 2019-01-25 NOTE — Progress Notes (Signed)
[place] TE[facility] [place].  HYI:[id number] DOB: 11/25/1956 DOA: 11/28/2018 PCP: Imagene Riches, NP    Brief Narrative:  63yo male with influenza A causing respiratory failure and cardiogenic shock. Prolonged mechanical ventilation, tracheostomy placed on January 16.  Significant Events:  1/6 self extubated  1/7 re-intubated  1/8 self extubated 1/9 acute respiratory failure, mucus plugging right lower lobe, re-intubated, bronchoscopy 1/10 New fever T Max 103.1, antibiotics resumed with ceftaz and vancomycin 1/11 Hemodynamically seems to be improving. Very hypernatremic. X-ray improving. Adding free water, one-time Lasix, follow-up chemistry a.m. Hoping to initiate weaning efforts on 1/12 1/16 Continuing diuresis and ongoing ventilatory support. Working on weaning but still has significant work of breathing. Plan has been to proceed with tracheostomy. Hopefully we can continue ongoing diuresis and continue to decrease sedation 1/20 still struggling to wean. Secretions and anxiety are barriers.  1/21 trach revision to 6 distal XLT, fever, increased secretions, abx broadened / bronch 1/22 ongoing thick secretions 1/24 Failed SBT, low grade fever, Per CXR persistent pleural effusions / basilar consolidation atelectasis vs pneumonia per bases 1/25 Mucus plugged with slow recovery after chest PT and Wound Care 2/3  first time weaned for greater than 15 minutes (previously failed due to agitation, secretions, tachypnea) 2/10 TRH assumed care 2/11 trach changed to a #5 cuffless per PCCM  2/13 trach changed to #4 shiley cuffless  2/13 afternoon - pulled out new trach and "disoldged" PEG - both reinserted/repositioned  2/13 PM > 2/14 AM acute hypoxic failure - emergent bedside bronch > trach obstructed - #6 cuffed trach placed > back to ICU on vent  2/14 trach changed to #5 cuffed 2/15 TRH resumed care - resp distress > back on vent  2/15  Failed ATC with  increased WOB 2/16  Failed ATC with increased WOB  2/21 back on pressure support 2/24 back on TC   Subjective: Looks great today. Speech becoming more strong and clear w/ pt using finger to obstruct trach. Denies cp, sob, n/v. Is committed to rehab stay at a SNF.   Assessment & Plan:  Acute hypoxic respiratory failure - Influenza A / Corynebacterium pneumonia - Prolonged respiratory failure  Ongoing care of trach per PCCM (will need to be seen in outpt f/u in trach clinic) - clinically stable on TC only - to complete 14 days of Ceftaz  Acute systolic heart failure no plans for cardiac cath per Cardiology - EF 20-25% via TTE 11/29/18, but improved to 60-65% on f/u 1/29 - likely due to sepsis - cont BB and ARB - wgt appears to be climbing (but was moved to new unit) but exam is euvolemic - no change in tx  Filed Weights   01/22/19 0500 01/23/19 0500 01/25/19 0440  Weight: 90.8 kg 91.8 kg 93.8 kg    Nutrition status post PEG tube by IR - cont tube feeds - tolerating well - SLP to cont to follow - may soon be able to advance diet   Acute metabolic encephalopathy Appears resolved at this time   DM2 CBG variable - follow w/o change for now   Deconditioning Awaiting placement in long term rehab - medically stable for d/c when bed available     Sacral decub Per wound care - General Surgery consulted on 2/21> no need for surgical debridement  DVT prophylaxis: lovenox  Code Status: FULL CODE Family Communication: no family present at time of exam  Disposition Plan: stable for tele bed -  cont trach care - complete abx tx - ready for d/c to SNF for rehab stay w/ ultimate plan for d/c home   Consultants:  PCCM Cardiology   Antimicrobials:  Ceftaz 2/20 >  Objective: Blood pressure 112/61, pulse 60, temperature 98.2 F (36.8 C), temperature source Oral, resp. rate 16, height 6' (1.829 m), weight 93.8 kg, SpO2 99 %.  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  Liver Function Tests: No results for input(s): AST, ALT, ALKPHOS, BILITOT, PROT, ALBUMIN in the last 168 hours.   HbA1C: Hgb A1c MFr Bld  Date/Time Value Ref Range Status  01/21/2019 04:01 AM 7.3 (H) 4.8 - 5.6 % Final    Comment:    (NOTE) Pre diabetes:          5.7%-6.4% Diabetes:              >6.4% Glycemic control for   <7.0% adults with diabetes   03/25/2017 05:59 AM 12.3 (H) 4.8 - 5.6 % Final    Comment:    (NOTE)         Pre-diabetes: 5.7 - 6.4         Diabetes: >6.4         Glycemic control for adults with diabetes: <7.0     CBG: Recent Labs  Lab 01/24/19 2003 01/24/19 2159 01/25/19 0434 01/25/19 0753 01/25/19 1318  GLUCAP 134* 112* 104* 112* 130*     Scheduled Meds: . arformoterol  15 mcg Nebulization BID  . aspirin  81 mg Per Tube Daily  . atorvastatin  40 mg Per Tube q1800  . budesonide (PULMICORT) nebulizer solution  0.5 mg  Nebulization BID  . carvedilol  3.125 mg Per Tube BID WC  . chlorhexidine gluconate (MEDLINE KIT)  15 mL Mouth Rinse BID  . collagenase   Topical Daily  . enoxaparin (LOVENOX) injection  40 mg Subcutaneous Q24H  . famotidine  20 mg Per Tube BID  . folic acid  1 mg Per Tube Daily  . free water  100 mL Per Tube Q8H  . guaiFENesin  5 mL Per Tube Q12H  . insulin aspart  0-20 Units Subcutaneous Q4H  .  insulin detemir  35 Units Subcutaneous BID  . ipratropium-albuterol  3 mL Nebulization TID  . mouth rinse  15 mL Mouth Rinse QID  . multivitamin  15 mL Per Tube Daily  . nutrition supplement (JUVEN)  1 packet Per Tube BID BM  . tapentadol  50 mg Oral QID  . thiamine  100 mg Per Tube Daily     LOS: 29 days   Cherene Altes, MD [facility] Office  [phone] Pager - Text Page per Amion  If 7PM-7AM, please contact night-coverage per Amion 01/25/2019, 2:12 PM

## 2019-01-25 NOTE — Progress Notes (Signed)
Pt is awake and resting comfortably in bed.  Denies sob/resp distress.  States he does not need suctioning at this time.  Will cont to monitor.

## 2019-01-25 NOTE — Progress Notes (Signed)
Pt denies any sob/resp distress at this time.  Pt refused trach care and suctioning at this time.  Will cont to monitor.

## 2019-01-25 NOTE — Progress Notes (Signed)
Pt resting in bed, no sob/resp distress noted.  Pt denies need for suctioning.  Will cont to monitor.

## 2019-01-26 DIAGNOSIS — J9600 Acute respiratory failure, unspecified whether with hypoxia or hypercapnia: Secondary | ICD-10-CM

## 2019-01-26 LAB — GLUCOSE, CAPILLARY
GLUCOSE-CAPILLARY: 118 mg/dL — AB (ref 70–99)
GLUCOSE-CAPILLARY: 158 mg/dL — AB (ref 70–99)
Glucose-Capillary: 151 mg/dL — ABNORMAL HIGH (ref 70–99)
Glucose-Capillary: 131 mg/dL — ABNORMAL HIGH (ref 70–99)
Glucose-Capillary: 154 mg/dL — ABNORMAL HIGH (ref 70–99)
Glucose-Capillary: 123 mg/dL — ABNORMAL HIGH (ref 70–99)

## 2019-01-26 MED ORDER — OSMOLITE 1.2 CAL PO LIQD
1000.0000 mL | ORAL | Status: DC
  Administered 2019-01-26 – 2019-01-30 (×4): 1000 mL
  Filled 2019-01-26 (×10): qty 1000

## 2019-01-26 MED ORDER — ACETYLCYSTEINE 20 % IN SOLN
4.0000 mL | Freq: Once | RESPIRATORY_TRACT | Status: AC
  Administered 2019-01-26: 4 mL via RESPIRATORY_TRACT
  Filled 2019-01-26: qty 4

## 2019-01-26 NOTE — Progress Notes (Signed)
NAME:  Antonio Honaker., MRN:  [id number], DOB:  [birth year], LOS: 39 ADMISSION DATE:  11/28/2018, CONSULTATION DATE:  1/3 REFERRING MD:  EDP, CHIEF COMPLAINT:  Dyspnea   Brief History   63 y/o M admitted 1/3 with influenza A causing respiratory failure and cardiogenic shock. Prolonged mechanical ventilation, tracheostomy placed on January 16.  Developed HCAP.  Difficult wean, has failed multiple attempts at downsize with mucus plugging. Repeat resp cx 2/18 Pseduomonas.  Past Medical History  DM2, CAD, HTN, HLD, tobacco use  Significant Hospital Events   1/03  Admit, Influenza A positive 1/06  Self extubated  1/07  Re-intubated  1/08  Self-extubated  1/09  Reintubated, mucus plugging on FOB 1/10  Fever to 103, abx restarted  1/16  Diuresing, increased WOB with weaning efforts.  Trach.  1/20  Failed wean, anxiety / secretions 1/21  Trach revision to XLT distal.  FOB  1/22  Thick secretions barrier to wean  1/24  Failed SBT, fever 1/25  Episode of mucus plugging after chest PT/wound care  2/03  Weaned 15 minutes  2/11  ATC  2/14  Trach changed to #4, mucus plugging >back to #5 XLT 2/15  Failed ATC with increased WOB 2/16  Failed ATC with increased WOB  2/21 back on pressure support  Consults:  PCCM Cardiology  General surgery 2/21 for sacral wound  Procedures:  ETT 1/3 >>1/6, 1/7 > 1/8> 1/9> 1/16 Trach 1/16 >>  PEG 1/28 >>     Significant Diagnostic Tests:  UDS 1/4 >negative  ECHO 1/4 >LVEF 20-25%, akinesis of the anteroseptal, anterolateral & apical myocardium, grade 1 diastolic dysfunction, small pericardial effusion without evidence of hemodynamic compromise. CT Chest / ABD / Pelvis 1/3 > airway thickening, motion artifact, CAD Echo 1/29> LVEF 60-65%, left ventricular hypertrophy, trivial pericardial effusion  Micro Data:  RVP 1/3 > positive for influenza A BCx2 1/3 >No growth UC 1/4 >No Growth Sputum Cx 1/7 > Normal respiratory flora BAL  >corynebacterium  Blood Cx 1/10 > neg Sputum 1/21 > abundant corynebacterium striatum Sputum 2/18 > Pseudomonas  Antimicrobials:  Vanco 1/3 x1;    Zosyn 1/30 x1  Tamiflu 1/3 >1/8 Vancomycin 1/10 > 1/12 Ceftaz 1/10 > 1/12; 1/21 >1/22 Emycin 1/12 > 1/22 Cefepime 1/23 > 1/24 Vanc 1/21> 1/28 Colistin 2/19 > Meropenem 2/19 > 2/20 Ceftaz 2/20 > 2/23  Interim history/subjective:  Off the ventilator since 2/24  Objective   Blood pressure 128/63, pulse 68, temperature 97.8 F (36.6 C), temperature source Oral, resp. rate 18, height 6' (1.829 m), weight 91.5 kg, SpO2 92 %.    FiO2 (%):  [28 %] 28 %   Intake/Output Summary (Last 24 hours) at 01/26/2019 1516 Last data filed at 01/26/2019 0433 Gross per 24 hour  Intake 2725.75 ml  Output 2875 ml  Net -149.25 ml   Filed Weights   01/23/19 0500 01/25/19 0440 01/26/19 0455  Weight: 91.8 kg 93.8 kg 91.5 kg   EXAM:   General:  Resting in bed, off the vent, blocking trach to speak HENT: [place]/AT, PERRL, EOM-I and MMM PULM: Coarse BS diffusely CV: RRR, Nl S1/S2 and -M/R/G GI: Soft, NT, ND and +BS MSK: -edema and -tenderness Neuro: Alert and interactive, moving all available ext to command  LABS   PULMONARY No results for input(s): PHART, PCO2ART, PO2ART, HCO3, TCO2, O2SAT in the last 168 hours.  Invalid input(s): PCO2, PO2 CBC Recent Labs  Lab 01/20/19 0401 01/24/19 0324  HGB 9.4* 9.2*  HCT  30.0* 30.6*  WBC 6.5 5.1  PLT 227 199   COAGULATION No results for input(s): INR in the last 168 hours.  CARDIAC   No results for input(s): TROPONINI in the last 168 hours. No results for input(s): PROBNP in the last 168 hours.  CHEMISTRY Recent Labs  Lab 01/20/19 0401 01/21/19 0254 01/24/19 0324  NA 136 136 136  K 4.3 4.0 4.1  CL 99 99 98  CO2 30 29 32  GLUCOSE 131* 162* 166*  BUN 27* 25* 20  CREATININE 0.57* 0.54* 0.43*  CALCIUM 8.5* 8.6* 8.8*  MG 1.7  --  1.8  PHOS  --   --  3.7   Estimated Creatinine Clearance:  107.8 mL/min (A) (by C-G formula based on SCr of 0.43 mg/dL (L)).  LIVER No results for input(s): AST, ALT, ALKPHOS, BILITOT, PROT, ALBUMIN, INR in the last 168 hours. INFECTIOUS No results for input(s): LATICACIDVEN, PROCALCITON in the last 168 hours. ENDOCRINE CBG (last 3)  Recent Labs    01/26/19 0408 01/26/19 0812 01/26/19 1212  GLUCAP 151* 131* 158*   IMAGING  I reviewed CXR myself, trach is in a good position  Resolved Hospital Problem list   AKI Pneumonia Hypokalemia Hypernatremia Cardiomyopathy evidenced via 1/4 ECHO, resolved.  Assessment & Plan:   RESP Acute Hypoxemic Respiratory Failure s/p Tracheostomy In setting of Flu A, Corynebacterium PNA, now new resp cx with Pseudomonas and persistent copious secretions, improved since restarting abx. Tolerating pressure support well - Monitor off abx - Colistin inhaled 14 days total - Pulmonary toilette - Titrate O2 for sat of 88-92% - Maintain on TC as tolerated - Off vent since 2/24  Trach status: - Place a cuffless 4 distal XLT trach bedside - Will change trach next visit - SLP consult for PMV and swallow evaluation  Discussed with RT  Rush Farmer, M.D. [facility] Pulmonary/Critical Care Medicine. Pager: [phone]. After hours pager: [phone].

## 2019-01-26 NOTE — Progress Notes (Signed)
Physical Therapy Wound Treatment Patient Details  Name: Antonio [name]. MRN: [id number] Date of Birth: 11-18-1956  Today's Date: 01/26/2019 Time: 7544-9201 Time Calculation (min): 38 min  Subjective  Subjective: "Thank you" Patient and Family Stated Goals: Pt on trach collar Date of Onset: (unknown) Prior Treatments: dressing changes  Pain Score:  Premedicated. Grimacing briefly with initiation of pulsatile lavage.  Wound Assessment  Pressure Injury 12/01/18 Unstageable - Full thickness tissue loss in which the base of the ulcer is covered by slough (yellow, tan, gray, green or brown) and/or eschar (tan, brown or black) in the wound bed. sacrum (Active)  Dressing Type Barrier Film (skin prep);ABD;Moist to moist;Gauze (Comment);Other (Comment) 01/26/2019 10:38 AM  Dressing Clean;Dry;Intact;Changed 01/26/2019 10:38 AM  Dressing Change Frequency Daily 01/26/2019 10:38 AM  State of Healing Early/partial granulation 01/26/2019 10:38 AM  Site / Wound Assessment Red;Yellow;Pink 01/26/2019 10:38 AM  % Wound base Red or Granulating 60% 01/26/2019 10:38 AM  % Wound base Yellow/Fibrinous Exudate 40% 01/26/2019 10:38 AM  % Wound base Black/Eschar 0% 01/26/2019 10:38 AM  % Wound base Other/Granulation Tissue (Comment) 0% 01/26/2019 10:38 AM  Peri-wound Assessment Erythema (blanchable) 01/26/2019 10:38 AM  Wound Length (cm) 9 cm 01/21/2019  4:22 PM  Wound Width (cm) 5 cm 01/21/2019  4:22 PM  Wound Depth (cm) 1 cm 01/21/2019  4:22 PM  Wound Surface Area (cm^2) 45 cm^2 01/21/2019  4:22 PM  Wound Volume (cm^3) 45 cm^3 01/21/2019  4:22 PM  Tunneling (cm) 0 12/31/2018 10:28 AM  Margins Unattached edges (unapproximated) 01/26/2019 10:38 AM  Drainage Amount Moderate 01/26/2019 10:38 AM  Drainage Description Serosanguineous 01/26/2019 10:38 AM  Treatment Debridement (Selective);Packing (Saline gauze);Hydrotherapy (Pulse lavage) 01/26/2019 10:38 AM   Santyl applied to wound bed prior to applying dressing.    Hydrotherapy Pulsed  lavage therapy - wound location: sacrum Pulsed Lavage with Suction (psi): 8 psi(8-12) Pulsed Lavage with Suction - Normal Saline Used: 1000 mL Pulsed Lavage Tip: Tip with splash shield Selective Debridement Selective Debridement - Location: sacrum Selective Debridement - Tools Used: Forceps;Scissors Selective Debridement - Tissue Removed: yellow necrotic tissue   Wound Assessment and Plan  Wound Therapy - Assess/Plan/Recommendations Wound Therapy - Clinical Statement: Continue with slow progress with removal of nonviable tissue through pulsatile lavage and selective debridement. Wound Therapy - Functional Problem List: decr mobility Factors Delaying/Impairing Wound Healing: Diabetes Mellitus;Immobility;Multiple medical problems Hydrotherapy Plan: Debridement;Dressing change;Patient/family education;Pulsatile lavage with suction Wound Therapy - Frequency: 3X / week Wound Therapy - Follow Up Recommendations: Skilled nursing facility Wound Plan: see above  Wound Therapy Goals- Improve the function of patient's integumentary system by progressing the wound(s) through the phases of wound healing (inflammation - proliferation - remodeling) by: Decrease Necrotic Tissue to: 30 Decrease Necrotic Tissue - Progress: Progressing toward goal Increase Granulation Tissue to: 70 Increase Granulation Tissue - Progress: Progressing toward goal  Goals will be updated until maximal potential achieved or discharge criteria met.  Discharge criteria: when goals achieved, discharge from hospital, MD decision/surgical intervention, no progress towards goals, refusal/missing three consecutive treatments without notification or medical reason.  GP     Shary Decamp [facility] 01/26/2019, 10:41 AM [place] Pager [phone] Office [phone]

## 2019-01-26 NOTE — Progress Notes (Signed)
Patient currently not on continuous pulse ox.  Requested set up to monitor patient.

## 2019-01-26 NOTE — Progress Notes (Addendum)
Nutrition Follow-up  DOCUMENTATION CODES:   Not applicable  INTERVENTION:  - D/C Vital AF 1.2 - Begin Osmolite 1.2 via PEG at 75 ml/h to provide 2160 kcals, 100 gm protein, 1476 ml free water daily - Continue Juven BID. Total intake provides 2320 kcal, 128g protein, 1492m free water daily.   - Continue MVI  NUTRITION DIAGNOSIS:   Inadequate oral intake related to inability to eat as evidenced by NPO status. Ongoing  GOAL:   Patient will meet greater than or equal to 90% of their needs Met with TF  MONITOR:   PO intake, TF tolerance, Labs, Skin, Weight trends, I & O's  ASSESSMENT:   63[facility], cough, chest pain & SOB. Pt found to be Influenza A positive. Developed progressive respiratory distress requiring intubation in the ER.    Pt on trach collar. Was willing to speak some; reported no n/v, diarrhea or constipation. Had a loose stool this AM but was not abnormal for him. No family members were present. Pt had no concerns or questions regarding TF or further diet advancement.   TF running at goal rate. SLP will continue to follow for possible diet advancement. CSW is still in search of a SNF.   Previous TF regimen Vital AF 1.2via PEGat 741mh to provide 2160kcals, 135gm protein, 146090mree water daily.   Medications reviewed and include: Pepcid, folic acid 1mg96m53m244QKe water, insulin aspart 0-20units, insulin detemir 35units 2x, liquid MVI 15ml14miamine 153mg 4m reviewed: CBG (118-151), K/Phos/Mg WNL   Diet Order:   Diet Order            Diet NPO time specified  Diet effective now              EDUCATION NEEDS:   Not appropriate for education at this time  Skin:  Skin Assessment: Skin Integrity Issues: Skin Integrity Issues:: Stage I, Unstageable Stage I: L head Stage II: N/A Unstageable: sacrum  Last BM:  3/2 loose stool  Height:   Ht Readings from Last 1 Encounters:  11/29/18 6' (1.829 m)     Weight:   Wt Readings from Last 1 Encounters:  01/26/19 91.5 kg    Ideal Body Weight:  76.1 kg(adjusted for L BKA)  BMI:  Body mass index is 27.36 kg/m.  Estimated Nutritional Needs:   Kcal:  2200-2400  Protein:  120-150 grams  Fluid:  > 2 L/day    Amber [employer]

## 2019-01-26 NOTE — Progress Notes (Signed)
[facility] TEAM [place].  AOZ:[id number] DOB: [birth year] DOA: 11/28/2018 PCP: Imagene Riches, NP    Brief Narrative:  63yo male with influenza A causing respiratory failure and cardiogenic shock. Prolonged mechanical ventilation, tracheostomy placed on January 16.  Significant Events:  1/6 self extubated  1/7 re-intubated  1/8 self extubated 1/9 acute respiratory failure, mucus plugging right lower lobe, re-intubated, bronchoscopy 1/10 New fever T Max 103.1, antibiotics resumed with ceftaz and vancomycin 1/11 Hemodynamically seems to be improving. Very hypernatremic. X-ray improving. Adding free water, one-time Lasix, follow-up chemistry a.m. Hoping to initiate weaning efforts on 1/12 1/16 Continuing diuresis and ongoing ventilatory support. Working on weaning but still has significant work of breathing. Plan has been to proceed with tracheostomy. Hopefully we can continue ongoing diuresis and continue to decrease sedation 1/20 still struggling to wean. Secretions and anxiety are barriers.  1/21 trach revision to 6 distal XLT, fever, increased secretions, abx broadened / bronch 1/22 ongoing thick secretions 1/24 Failed SBT, low grade fever, Per CXR persistent pleural effusions / basilar consolidation atelectasis vs pneumonia per bases 1/25 Mucus plugged with slow recovery after chest PT and Wound Care 2/3  first time weaned for greater than 15 minutes (previously failed due to agitation, secretions, tachypnea) 2/10 TRH assumed care 2/11 trach changed to a #5 cuffless per PCCM  2/13 trach changed to #4 shiley cuffless  2/13 afternoon - pulled out new trach and "disoldged" PEG - both reinserted/repositioned  2/13 PM > 2/14 AM acute hypoxic failure - emergent bedside bronch > trach obstructed - #6 cuffed trach placed > back to ICU on vent  2/14 trach changed to #5 cuffed 2/15 TRH resumed care - resp distress > back on vent  2/15  Failed ATC with  increased WOB 2/16  Failed ATC with increased WOB  2/21 back on pressure support 2/24 back on TC   Subjective:  Patient in bed, appears comfortable, denies any headache, no fever, no chest pain or pressure, no shortness of breath , no abdominal pain. No focal weakness.  Assessment & Plan:  Acute hypoxic respiratory failure - Influenza A / Corynebacterium pneumonia -was under the care of ICU, required multiple intubations thereafter tracheostomy, currently clinically improving.  Added Mucomyst for pulmonary toiletry and suction, encouraged to sit up in chair, finishing his ceftaz dose with a stop date of 01/28/2019.  Acute systolic heart failure  -  no plans for cardiac cath per Cardiology - EF 20-25% via TTE 11/29/18, but improved to 60-65% on f/u 1/29 - likely due to sepsis - cont BB and ARB - wgt appears to be climbing (but was moved to new unit) but exam is euvolemic - no change in tx   [facility] Weights   01/23/19 0500 01/25/19 0440 01/26/19 0455  Weight: 91.8 kg 93.8 kg 91.5 kg    Nutrition status post PEG tube by IR - cont tube feeds - tolerating well - SLP to cont to follow - may soon be able to advance diet   Acute metabolic encephalopathy Appears resolved at this time   DM2 CBG variable - follow w/o change for now   CBG (last 3)  Recent Labs    01/26/19 0024 01/26/19 0408 01/26/19 0812  GLUCAP 118* 151* 131*     Deconditioning Awaiting placement in long term rehab - medically stable for d/c when bed available     Sacral decub Per wound care - General Surgery consulted on 2/21> no need  for surgical debridement    DVT prophylaxis: lovenox  Code Status: FULL CODE Family Communication: no family present at time of exam  Disposition Plan: stable for tele bed - cont trach care - complete abx tx - ready for d/c to SNF for rehab stay w/ ultimate plan for d/c home   Consultants:  PCCM Cardiology   Antimicrobials:  Ceftaz 2/20 >  Objective: Blood pressure (!)  125/54, pulse 70, temperature 98.2 F (36.8 C), temperature source Oral, resp. rate 16, height 6' (1.829 m), weight 91.5 kg, SpO2 97 %.  Intake/Output Summary (Last 24 hours) at 01/26/2019 1156 Last data filed at 01/26/2019 0433 Gross per 24 hour  Intake 2725.75 ml  Output 2875 ml  Net -149.25 ml   Filed Weights   01/23/19 0500 01/25/19 0440 01/26/19 0455  Weight: 91.8 kg 93.8 kg 91.5 kg    Examination:  Awake Alert, Oriented X 3, No new F.N deficits, coarse bilateral breath sounds, trach site appears clean with trach collar in place, PEG tube in place site clean, left BKA, right first toe ray amputation [place].AT,PERRAL Supple Neck,No JVD, No cervical lymphadenopathy appriciated.  Symmetrical Chest wall movement, Good air movement bilaterally,   RRR,No Gallops, Rubs or new Murmurs, No Parasternal Heave +ve B.Sounds, Abd Soft, No tenderness, No organomegaly appriciated, No rebound - guarding or rigidity. No Cyanosis, Clubbing or edema, No new Rash or bruise   CBC: Recent Labs  Lab 01/20/19 0401 01/24/19 0324  WBC 6.5 5.1  NEUTROABS 3.8  --   HGB 9.4* 9.2*  HCT 30.0* 30.6*  MCV 88.8 88.7  PLT 227 381   Basic Metabolic Panel: Recent Labs  Lab 01/20/19 0401 01/21/19 0254 01/24/19 0324  NA 136 136 136  K 4.3 4.0 4.1  CL 99 99 98  CO2 30 29 32  GLUCOSE 131* 162* 166*  BUN 27* 25* 20  CREATININE 0.57* 0.54* 0.43*  CALCIUM 8.5* 8.6* 8.8*  MG 1.7  --  1.8  PHOS  --   --  3.7   GFR: Estimated Creatinine Clearance: 107.8 mL/min (A) (by C-G formula based on SCr of 0.43 mg/dL (L)).  Liver Function Tests: No results for input(s): AST, ALT, ALKPHOS, BILITOT, PROT, ALBUMIN in the last 168 hours.   HbA1C: Hgb A1c MFr Bld  Date/Time Value Ref Range Status  01/21/2019 04:01 AM 7.3 (H) 4.8 - 5.6 % Final    Comment:    (NOTE) Pre diabetes:          5.7%-6.4% Diabetes:              >6.4% Glycemic control for   <7.0% adults with diabetes   03/25/2017 05:59 AM 12.3 (H) 4.8 -  5.6 % Final    Comment:    (NOTE)         Pre-diabetes: 5.7 - 6.4         Diabetes: >6.4         Glycemic control for adults with diabetes: <7.0     CBG: Recent Labs  Lab 01/25/19 1650 01/25/19 2009 01/26/19 0024 01/26/19 0408 01/26/19 0812  GLUCAP 135* 135* 118* 151* 131*     Scheduled Meds: . acetylcysteine  4 mL Nebulization Once  . arformoterol  15 mcg Nebulization BID  . aspirin  81 mg Per Tube Daily  . atorvastatin  40 mg Per Tube q1800  . budesonide (PULMICORT) nebulizer solution  0.5 mg Nebulization BID  . carvedilol  3.125 mg Per Tube BID WC  .  chlorhexidine gluconate (MEDLINE KIT)  15 mL Mouth Rinse BID  . collagenase   Topical Daily  . enoxaparin (LOVENOX) injection  40 mg Subcutaneous Q24H  . famotidine  20 mg Per Tube BID  . folic acid  1 mg Per Tube Daily  . free water  100 mL Per Tube Q8H  . guaiFENesin  5 mL Per Tube Q12H  . insulin aspart  0-20 Units Subcutaneous Q4H  . insulin detemir  35 Units Subcutaneous BID  . ipratropium-albuterol  3 mL Nebulization TID  . mouth rinse  15 mL Mouth Rinse QID  . multivitamin  15 mL Per Tube Daily  . nutrition supplement (JUVEN)  1 packet Per Tube BID BM  . tapentadol  50 mg Oral QID  . thiamine  100 mg Per Tube Daily     LOS: 59 days   Signature  Lala Lund M.D on 01/26/2019 at 11:56 AM   -  To page go to [email or web address]

## 2019-01-26 NOTE — Progress Notes (Signed)
Physical Therapy Treatment Patient Details Name: Antonio [name]. MRN: [id number] DOB: 1956-06-26 Today's Date: 01/26/2019    History of Present Illness 63 yo admitted with flu A and PNA with acute respiratory failure intubated 1/4, trach 1/16, PEG 1/28 with course complicated by acute MI, CHF and cardiogenic shock and mucous plugging. Transferred to ICU late 2/13/early 2/14 due to dyspnea and drop in sats with thick mucous.PMhx: DM, HTN, HLD, CAD, Lt BKA, rt toe amputation    PT Comments    Pt performed gait training and functional mobility.  On arrival patient sitting edge of bed and requesting to use commode.  Post BM assisted patient to standing and progressed to gait training.  Pt is slow and guarded.  Pt limited due to fatigue and reports of feeling "too hot" in halls.  Pt continues to benefit from skilled rehab in a post acute setting.  He reports feeling need to be suctioned post session and RN informed.  Plan for progression of gait training next session.      Follow Up Recommendations  CIR     Equipment Recommendations  Rolling walker with 5" wheels;3in1 (PT)    Recommendations for Other Services       Precautions / Restrictions Precautions Precautions: Fall Precaution Comments: pt seen on trach collar Other Brace: LLE prothesis, PEG, PMV (make sure cuff is deflated) Restrictions Weight Bearing Restrictions: No LLE Weight Bearing: Weight bearing as tolerated    Mobility  Bed Mobility               General bed mobility comments: Pt seated on edge of bed on arrival, assisted to donn shoe on R and prosthesis on L.   Transfers Overall transfer level: Needs assistance Equipment used: Rolling walker (2 wheeled) Transfers: Sit to/from Stand Sit to Stand: Mod assist;+2 safety/equipment Stand pivot transfers: Min assist;+2 safety/equipment       General transfer comment: Cues for hand placement  to and from seated surface,  pt performed stand pivot without RW  and sit to stand after to progress to gait training.    Ambulation/Gait Ambulation/Gait assistance: Min assist Gait Distance (Feet): 30 Feet Assistive device: Rolling walker (2 wheeled) Gait Pattern/deviations: Step-to pattern;Narrow base of support;Trunk flexed Gait velocity: decreased   General Gait Details: Cues for RW safety, turning and backing.  Pt limited gait distance in halls as he reports he is too hot.  Pt on 6L Trach collar O2 attachment.     Stairs             Wheelchair Mobility    Modified Rankin (Stroke Patients Only)       Balance Overall balance assessment: Needs assistance   Sitting balance-Leahy Scale: Good       Standing balance-Leahy Scale: Poor Standing balance comment: Reliant on B support.                              Cognition Arousal/Alertness: Awake/alert Behavior During Therapy: Flat affect Overall Cognitive Status: Impaired/Different from baseline Area of Impairment: Safety/judgement;Problem solving;Awareness                         Safety/Judgement: Decreased awareness of safety;Decreased awareness of deficits   Problem Solving: Difficulty sequencing;Requires verbal cues;Requires tactile cues General Comments: Pt agitated on arrival and wanting to transfer to commode.  He is self limiting and requires cues throughout session for safety.  Exercises      General Comments        Pertinent Vitals/Pain Pain Assessment: No/denies pain    Home Living                      Prior Function            PT Goals (current goals can now be found in the care plan section) Acute Rehab PT Goals Patient Stated Goal: pt agreeable to working with therapies Potential to Achieve Goals: Good Progress towards PT goals: Progressing toward goals    Frequency    Min 3X/week      PT Plan Current plan remains appropriate    Co-evaluation              AM-PAC PT "6 Clicks" Mobility   Outcome  Measure  Help needed turning from your back to your side while in a flat bed without using bedrails?: A Little Help needed moving from lying on your back to sitting on the side of a flat bed without using bedrails?: A Lot Help needed moving to and from a bed to a chair (including a wheelchair)?: A Little Help needed standing up from a chair using your arms (e.g., wheelchair or bedside chair)?: A Little Help needed to walk in hospital room?: A Little Help needed climbing 3-5 steps with a railing? : A Lot 6 Click Score: 16    End of Session Equipment Utilized During Treatment: Gait belt;Oxygen;Other (comment) Activity Tolerance: Patient tolerated treatment well Patient left: in chair;with call bell/phone within reach;with chair alarm set;Other (comment) Nurse Communication: Mobility status PT Visit Diagnosis: Muscle weakness (generalized) (M62.81);Difficulty in walking, not elsewhere classified (R26.2)     Time: 9030-0923 PT Time Calculation (min) (ACUTE ONLY): 27 min  Charges:  $Gait Training: 8-22 mins $Therapeutic Activity: 8-22 mins                     Joycelyn Rua, PTA Acute Rehabilitation Services Pager [phone] Office [phone]     Emilly Lavey Artis Delay 01/26/2019, 2:46 PM

## 2019-01-27 LAB — COMPREHENSIVE METABOLIC PANEL
ALT: 18 U/L (ref 0–44)
AST: 14 U/L — ABNORMAL LOW (ref 15–41)
Albumin: 2.6 g/dL — ABNORMAL LOW (ref 3.5–5.0)
Alkaline Phosphatase: 57 U/L (ref 38–126)
Anion gap: 7 (ref 5–15)
BILIRUBIN TOTAL: 0.4 mg/dL (ref 0.3–1.2)
BUN: 25 mg/dL — ABNORMAL HIGH (ref 6–20)
CHLORIDE: 96 mmol/L — AB (ref 98–111)
CO2: 33 mmol/L — ABNORMAL HIGH (ref 22–32)
Calcium: 9 mg/dL (ref 8.9–10.3)
Creatinine, Ser: 0.55 mg/dL — ABNORMAL LOW (ref 0.61–1.24)
GFR calc Af Amer: >60 mL/min (ref >60)
GFR calc non Af Amer: >60 mL/min (ref >60)
Glucose, Bld: 177 mg/dL — ABNORMAL HIGH (ref 70–99)
POTASSIUM: 4.3 mmol/L (ref 3.5–5.1)
Sodium: 136 mmol/L (ref 135–145)
TOTAL PROTEIN: 6.6 g/dL (ref 6.5–8.1)

## 2019-01-27 LAB — GLUCOSE, CAPILLARY
GLUCOSE-CAPILLARY: 189 mg/dL — AB (ref 70–99)
Glucose-Capillary: 106 mg/dL — ABNORMAL HIGH (ref 70–99)
Glucose-Capillary: 157 mg/dL — ABNORMAL HIGH (ref 70–99)
Glucose-Capillary: 161 mg/dL — ABNORMAL HIGH (ref 70–99)
Glucose-Capillary: 170 mg/dL — ABNORMAL HIGH (ref 70–99)
Glucose-Capillary: 171 mg/dL — ABNORMAL HIGH (ref 70–99)

## 2019-01-27 LAB — CBC
HCT: 32.8 % — ABNORMAL LOW (ref 39.0–52.0)
Hemoglobin: 10.3 g/dL — ABNORMAL LOW (ref 13.0–17.0)
MCH: 27.6 pg (ref 26.0–34.0)
MCHC: 31.4 g/dL (ref 30.0–36.0)
MCV: 87.9 fL (ref 80.0–100.0)
PLATELETS: 198 10*3/uL (ref 150–400)
RBC: 3.73 MIL/uL — ABNORMAL LOW (ref 4.22–5.81)
RDW: 17.3 % — ABNORMAL HIGH (ref 11.5–15.5)
WBC: 5.7 10*3/uL (ref 4.0–10.5)
nRBC: 0 % (ref 0.0–0.2)

## 2019-01-27 LAB — MAGNESIUM: MAGNESIUM: 1.9 mg/dL (ref 1.7–2.4)

## 2019-01-27 LAB — PHOSPHORUS: Phosphorus: 4.3 mg/dL (ref 2.5–4.6)

## 2019-01-27 MED ORDER — ACETYLCYSTEINE 20 % IN SOLN
4.0000 mL | Freq: Once | RESPIRATORY_TRACT | Status: AC
  Administered 2019-01-27: 4 mL via RESPIRATORY_TRACT
  Filled 2019-01-27: qty 4

## 2019-01-27 NOTE — Progress Notes (Signed)
[place] TEAM [place].  VOH:[id number] DOB: [birth year] DOA: 11/28/2018 PCP: Imagene Riches, NP    Brief Narrative:  63yo male with influenza A causing respiratory failure and cardiogenic shock. Prolonged mechanical ventilation, tracheostomy placed on January 16.  Significant Events:  1/6 self extubated  1/7 re-intubated  1/8 self extubated 1/9 acute respiratory failure, mucus plugging right lower lobe, re-intubated, bronchoscopy 1/10 New fever T Max 103.1, antibiotics resumed with ceftaz and vancomycin 1/11 Hemodynamically seems to be improving. Very hypernatremic. X-ray improving. Adding free water, one-time Lasix, follow-up chemistry a.m. Hoping to initiate weaning efforts on 1/12 1/16 Continuing diuresis and ongoing ventilatory support. Working on weaning but still has significant work of breathing. Plan has been to proceed with tracheostomy. Hopefully we can continue ongoing diuresis and continue to decrease sedation 1/20 still struggling to wean. Secretions and anxiety are barriers.  1/21 trach revision to 6 distal XLT, fever, increased secretions, abx broadened / bronch 1/22 ongoing thick secretions 1/24 Failed SBT, low grade fever, Per CXR persistent pleural effusions / basilar consolidation atelectasis vs pneumonia per bases 1/25 Mucus plugged with slow recovery after chest PT and Wound Care 2/3  first time weaned for greater than 15 minutes (previously failed due to agitation, secretions, tachypnea) 2/10 TRH assumed care 2/11 trach changed to a #5 cuffless per PCCM  2/13 trach changed to #4 shiley cuffless  2/13 afternoon - pulled out new trach and "disoldged" PEG - both reinserted/repositioned  2/13 PM > 2/14 AM acute hypoxic failure - emergent bedside bronch > trach obstructed - #6 cuffed trach placed > back to ICU on vent  2/14 trach changed to #5 cuffed 2/15 TRH resumed care - resp distress > back on vent  2/15  Failed ATC with  increased WOB 2/16  Failed ATC with increased WOB  2/21 back on pressure support 2/24 back on TC    Subjective:  Patient in bed, appears comfortable, denies any headache, no fever, no chest pain or pressure, no shortness of breath , no abdominal pain. No focal weakness.   Assessment & Plan:  Acute hypoxic respiratory failure - Influenza A / Corynebacterium pneumonia -was under the care of ICU, required multiple intubations thereafter tracheostomy, much improved after he received a treatment of Mucomyst on 01/26/2019 followed by suctioning, also activity has been increased he has been encouraged to sit up in chair, repeat Mucomyst treatment on 01/27/2019.  He is about to finish his IV ceftaz course with a stop date of 01/28/2019.  Clinically respiratory failure much improved.  Defer trach management to pulmonary.   Acute systolic heart failure  -  no plans for cardiac cath per Cardiology - EF 20-25% via TTE 11/29/18, but improved to 60-65% on f/u 1/29 - likely due to sepsis - cont BB and ARB - monitor weight closely and monitor clinically closely.  Currently no need for diuretics.  Filed Weights   01/25/19 0440 01/26/19 0455 01/27/19 0712  Weight: 93.8 kg 91.5 kg 90.9 kg    Nutrition - status post PEG tube by IR - cont tube feeds - tolerating well - SLP to cont to follow - may soon be able to advance diet   Acute metabolic encephalopathy - Appears resolved at this time   Deconditioning - Awaiting placement in long term rehab - medically stable for d/c when bed available     Sacral decub - Per wound care - General Surgery consulted on 2/21> no need for surgical  debridement  DM2 - ISS  CBG (last 3)  Recent Labs    01/27/19 0017 01/27/19 0424 01/27/19 0850  GLUCAP 106* 157* 189*   Lab Results  Component Value Date   HGBA1C 7.3 (H) 01/21/2019     DVT prophylaxis: lovenox  Code Status: FULL CODE Family Communication: no family present at time of exam  Disposition Plan: stable for  tele bed - cont trach care - complete abx tx - ready for d/c to SNF for rehab stay w/ ultimate plan for d/c home   Consultants:  PCCM Cardiology   Antimicrobials:  Ceftaz 2/20 >  Objective: Blood pressure 126/63, pulse 66, temperature 98 F (36.7 C), resp. rate 18, height 6' (1.829 m), weight 90.9 kg, SpO2 99 %.  Intake/Output Summary (Last 24 hours) at 01/27/2019 1048 Last data filed at 01/27/2019 0857 Gross per 24 hour  Intake 342.5 ml  Output 550 ml  Net -207.5 ml   Filed Weights   01/25/19 0440 01/26/19 0455 01/27/19 0712  Weight: 93.8 kg 91.5 kg 90.9 kg    Examination:  Awake Alert, Oriented X 3, No new F.N deficits, Normal affect [place].AT,PERRAL Supple Neck,No JVD, No cervical lymphadenopathy appriciated.  Symmetrical Chest wall movement, Good air movement bilaterally, CTAB RRR,No Gallops, Rubs or new Murmurs, No Parasternal Heave +ve B.Sounds, Abd Soft, No tenderness, No organomegaly appriciated, No rebound - guarding or rigidity. No Cyanosis,  trach site appears clean with trach collar in place, PEG tube in place site clean, left BKA, right first toe ray amputation   CBC: Recent Labs  Lab 01/24/19 0324 01/27/19 0458  WBC 5.1 5.7  HGB 9.2* 10.3*  HCT 30.6* 32.8*  MCV 88.7 87.9  PLT 199 161   Basic Metabolic Panel: Recent Labs  Lab 01/21/19 0254 01/24/19 0324 01/27/19 0458  NA 136 136 136  K 4.0 4.1 4.3  CL 99 98 96*  CO2 29 32 33*  GLUCOSE 162* 166* 177*  BUN 25* 20 25*  CREATININE 0.54* 0.43* 0.55*  CALCIUM 8.6* 8.8* 9.0  MG  --  1.8 1.9  PHOS  --  3.7 4.3   GFR: Estimated Creatinine Clearance: 107.8 mL/min (A) (by C-G formula based on SCr of 0.55 mg/dL (L)).  Liver Function Tests: Recent Labs  Lab 01/27/19 0458  AST 14*  ALT 18  ALKPHOS 57  BILITOT 0.4  PROT 6.6  ALBUMIN 2.6*     HbA1C: Hgb A1c MFr Bld  Date/Time Value Ref Range Status  01/21/2019 04:01 AM 7.3 (H) 4.8 - 5.6 % Final    Comment:    (NOTE) Pre diabetes:           5.7%-6.4% Diabetes:              >6.4% Glycemic control for   <7.0% adults with diabetes   03/25/2017 05:59 AM 12.3 (H) 4.8 - 5.6 % Final    Comment:    (NOTE)         Pre-diabetes: 5.7 - 6.4         Diabetes: >6.4         Glycemic control for adults with diabetes: <7.0     CBG: Recent Labs  Lab 01/26/19 1732 01/26/19 2015 01/27/19 0017 01/27/19 0424 01/27/19 0850  GLUCAP 154* 123* 106* 157* 189*     Scheduled Meds: . arformoterol  15 mcg Nebulization BID  . aspirin  81 mg Per Tube Daily  . atorvastatin  40 mg Per Tube q1800  . budesonide (PULMICORT)  nebulizer solution  0.5 mg Nebulization BID  . carvedilol  3.125 mg Per Tube BID WC  . chlorhexidine gluconate (MEDLINE KIT)  15 mL Mouth Rinse BID  . collagenase   Topical Daily  . enoxaparin (LOVENOX) injection  40 mg Subcutaneous Q24H  . famotidine  20 mg Per Tube BID  . folic acid  1 mg Per Tube Daily  . free water  100 mL Per Tube Q8H  . guaiFENesin  5 mL Per Tube Q12H  . insulin aspart  0-20 Units Subcutaneous Q4H  . insulin detemir  35 Units Subcutaneous BID  . ipratropium-albuterol  3 mL Nebulization TID  . mouth rinse  15 mL Mouth Rinse QID  . multivitamin  15 mL Per Tube Daily  . nutrition supplement (JUVEN)  1 packet Per Tube BID BM  . tapentadol  50 mg Oral QID  . thiamine  100 mg Per Tube Daily     LOS: 60 days   Signature  Lala Lund M.D on 01/27/2019 at 10:48 AM   -  To page go to [email or web address]

## 2019-01-27 NOTE — Progress Notes (Signed)
Lisabeth Devoid reviewing referral.   Cristobal Goldmann LCSW [phone]

## 2019-01-27 NOTE — Progress Notes (Signed)
Continuous pulse ox set-up. O2 sats 98%. Pt in no apparent distress.

## 2019-01-27 NOTE — Progress Notes (Addendum)
NAME:  Antonio [name]., MRN:  [id number], DOB:  04/13/1956, LOS: 26 ADMISSION DATE:  11/28/2018, CONSULTATION DATE:  1/3 REFERRING MD:  EDP, CHIEF COMPLAINT:  Dyspnea   Brief History   63 y/o M admitted 1/3 with influenza A causing respiratory failure and cardiogenic shock. Prolonged mechanical ventilation, tracheostomy placed on January 16.  Developed HCAP.  Difficult wean, has failed multiple attempts at downsize with mucus plugging. Repeat resp cx 2/18 Pseduomonas.  Past Medical History  DM2, CAD, HTN, HLD, tobacco use  Significant Hospital Events   1/03  Admit, Influenza A positive 1/06  Self extubated  1/07  Re-intubated  1/08  Self-extubated  1/09  Reintubated, mucus plugging on FOB 1/10  Fever to 103, abx restarted  1/16  Diuresing, increased WOB with weaning efforts.  Trach.  1/20  Failed wean, anxiety / secretions 1/21  Trach revision to XLT distal.  FOB  1/22  Thick secretions barrier to wean  1/24  Failed SBT, fever 1/25  Episode of mucus plugging after chest PT/wound care  2/03  Weaned 15 minutes  2/11  ATC  2/14  Trach changed to #4, mucus plugging >back to #5 XLT 2/15  Failed ATC with increased WOB 2/16  Failed ATC with increased WOB  2/21 back on pressure support  Consults:  PCCM Cardiology  General surgery 2/21 for sacral wound  Procedures:  ETT 1/3 >>1/6, 1/7 > 1/8> 1/9> 1/16 Trach 1/16 >>  PEG 1/28 >>     Significant Diagnostic Tests:  UDS 1/4 >negative  ECHO 1/4 >LVEF 20-25%, akinesis of the anteroseptal, anterolateral & apical myocardium, grade 1 diastolic dysfunction, small pericardial effusion without evidence of hemodynamic compromise. CT Chest / ABD / Pelvis 1/3 > airway thickening, motion artifact, CAD Echo 1/29> LVEF 60-65%, left ventricular hypertrophy, trivial pericardial effusion  Micro Data:  RVP 1/3 > positive for influenza A BCx2 1/3 >No growth UC 1/4 >No Growth Sputum Cx 1/7 > Normal respiratory flora BAL  >corynebacterium  Blood Cx 1/10 > neg Sputum 1/21 > abundant corynebacterium striatum Sputum 2/18 > Pseudomonas  Antimicrobials:  Vanco 1/3 x1;    Zosyn 1/30 x1  Tamiflu 1/3 >1/8 Vancomycin 1/10 > 1/12 Ceftaz 1/10 > 1/12; 1/21 >1/22 Emycin 1/12 > 1/22 Cefepime 1/23 > 1/24 Vanc 1/21> 1/28 Colistin 2/19 > Meropenem 2/19 > 2/20 Ceftaz 2/20 > 2/23  Interim history/subjective:  Off the ventilator since 2/24  Objective   Blood pressure 126/63, pulse 66, temperature 98 F (36.7 C), resp. rate 18, height 6' (1.829 m), weight 90.9 kg, SpO2 99 %.    FiO2 (%):  [28 %-35 %] 28 %   Intake/Output Summary (Last 24 hours) at 01/27/2019 1127 Last data filed at 01/27/2019 0857 Gross per 24 hour  Intake 342.5 ml  Output 550 ml  Net -207.5 ml   Filed Weights   01/25/19 0440 01/26/19 0455 01/27/19 0712  Weight: 93.8 kg 91.5 kg 90.9 kg   EXAM:   General:  Resting in bed, off the vent, blocking trach to speak HENT: [place]/AT, PERRL, EOM-I and MMM PULM: Coarse BS diffusely CV: RRR, Nl S1/S2 and -M/R/G GI: Soft, NT, ND and +BS MSK: -edema and -tenderness Neuro: Alert and interactive, moving all available ext to command  LABS   PULMONARY No results for input(s): PHART, PCO2ART, PO2ART, HCO3, TCO2, O2SAT in the last 168 hours.  Invalid input(s): PCO2, PO2 CBC Recent Labs  Lab 01/24/19 0324 01/27/19 0458  HGB 9.2* 10.3*  HCT 30.6* 32.8*  WBC 5.1 5.7  PLT 199 198   COAGULATION No results for input(s): INR in the last 168 hours.  CARDIAC   No results for input(s): TROPONINI in the last 168 hours. No results for input(s): PROBNP in the last 168 hours.  CHEMISTRY Recent Labs  Lab 01/21/19 0254 01/24/19 0324 01/27/19 0458  NA 136 136 136  K 4.0 4.1 4.3  CL 99 98 96*  CO2 29 32 33*  GLUCOSE 162* 166* 177*  BUN 25* 20 25*  CREATININE 0.54* 0.43* 0.55*  CALCIUM 8.6* 8.8* 9.0  MG  --  1.8 1.9  PHOS  --  3.7 4.3   Estimated Creatinine Clearance: 107.8 mL/min (A) (by C-G  formula based on SCr of 0.55 mg/dL (L)).  LIVER Recent Labs  Lab 01/27/19 0458  AST 14*  ALT 18  ALKPHOS 57  BILITOT 0.4  PROT 6.6  ALBUMIN 2.6*   INFECTIOUS No results for input(s): LATICACIDVEN, PROCALCITON in the last 168 hours. ENDOCRINE CBG (last 3)  Recent Labs    01/27/19 0017 01/27/19 0424 01/27/19 0850  GLUCAP 106* 157* 189*     Resolved Hospital Problem list   AKI Pneumonia Hypokalemia Hypernatremia Cardiomyopathy evidenced via 1/4 ECHO, resolved.  Assessment & Plan:   RESP Acute Hypoxemic Respiratory Failure s/p Tracheostomy In setting of Flu A, Corynebacterium PNA, now new resp cx with Pseudomonas and persistent copious secretions, improved since restarting abx. Tolerating pressure support well - Monitor off abx - Colistin inhaled 14 days total - Pulmonary hygiene - Titrate O2 for sat of 88-92% - Maintain on TC as tolerated - Off vent since 2/24  Trach status: - Plan is to change trach to a cuffless 4 distal XLT trach at the bedside - following trach change, SLP consult for PMV and swallow evaluation  Eliseo Gum MSN, AGACNP-BC [place] 01/27/2019, 11:29 AM  Attending Note:  63 year old male s/p respiratory failure due to Flu A that is now trached.  On exam, he is alert and interactive, moving all available ext to command with clear lungs.  I reviewed CXR myself, trach is in a good position.  Discussed with PCCM-NP.  Hypoxemia:  - Titrate O2 for sat of 88-92%  Respiratory failure:  - Maintain on TC as tolerated  Trach status:  - First trach change today from a cuffed distal XLT to a cuffless distal XLT today  PCCM will continue to follow.  Patient seen and examined, agree with above note.  I dictated the care and orders written for this patient under my direction.  Rush Farmer, [place]

## 2019-01-27 NOTE — Procedures (Signed)
First Trach Change  Changed from 6 distal XLT cuffed to 5 distal XLT cuffless trach done by MD  Verified by auscultation and color change  Alyson Reedy, M.D. [facility] Pulmonary/Critical Care Medicine. Pager: [phone]. After hours pager: [phone].

## 2019-01-28 LAB — GLUCOSE, CAPILLARY
Glucose-Capillary: 118 mg/dL — ABNORMAL HIGH (ref 70–99)
Glucose-Capillary: 122 mg/dL — ABNORMAL HIGH (ref 70–99)
Glucose-Capillary: 172 mg/dL — ABNORMAL HIGH (ref 70–99)
Glucose-Capillary: 188 mg/dL — ABNORMAL HIGH (ref 70–99)
Glucose-Capillary: 191 mg/dL — ABNORMAL HIGH (ref 70–99)
Glucose-Capillary: 218 mg/dL — ABNORMAL HIGH (ref 70–99)

## 2019-01-28 NOTE — Progress Notes (Addendum)
NAME:  Adael Culbreath., MRN:  [id number], DOB:  1956-04-29, LOS: 36 ADMISSION DATE:  11/28/2018, CONSULTATION DATE:  1/3 REFERRING MD:  EDP, CHIEF COMPLAINT:  Dyspnea   Brief History   63 y/o M admitted 1/3 with influenza A causing respiratory failure and cardiogenic shock. Prolonged mechanical ventilation, tracheostomy placed on January 16.  Developed HCAP.  Difficult wean, has failed multiple attempts at downsize with mucus plugging. Repeat resp cx 2/18 Pseduomonas.  Past Medical History  DM2, CAD, HTN, HLD, tobacco use  Significant Hospital Events   1/03  Admit, Influenza A positive 1/06  Self extubated  1/07  Re-intubated  1/08  Self-extubated  1/09  Reintubated, mucus plugging on FOB 1/10  Fever to 103, abx restarted  1/16  Diuresing, increased WOB with weaning efforts.  Trach.  1/20  Failed wean, anxiety / secretions 1/21  Trach revision to XLT distal.  FOB  1/22  Thick secretions barrier to wean  1/24  Failed SBT, fever 1/25  Episode of mucus plugging after chest PT/wound care  2/03  Weaned 15 minutes  2/11  ATC  2/14  Trach changed to #4, mucus plugging >back to #5 XLT 2/15  Failed ATC with increased WOB 2/16  Failed ATC with increased WOB  2/21 back on pressure support 3/3  Changed to cuffless xlt trach  Consults:  PCCM Cardiology  General surgery 2/21 for sacral wound  Procedures:  ETT 1/3 >>1/6, 1/7 > 1/8> 1/9> 1/16 Trach 1/16 >>  PEG 1/28 >>     Significant Diagnostic Tests:  UDS 1/4 >negative  ECHO 1/4 >LVEF 20-25%, akinesis of the anteroseptal, anterolateral & apical myocardium, grade 1 diastolic dysfunction, small pericardial effusion without evidence of hemodynamic compromise. CT Chest / ABD / Pelvis 1/3 > airway thickening, motion artifact, CAD Echo 1/29> LVEF 60-65%, left ventricular hypertrophy, trivial pericardial effusion  Micro Data:  RVP 1/3 > positive for influenza A BCx2 1/3 >No growth UC 1/4 >No Growth Sputum Cx 1/7 > Normal  respiratory flora BAL >corynebacterium  Blood Cx 1/10 > neg Sputum 1/21 > abundant corynebacterium striatum Sputum 2/18 > Pseudomonas  Antimicrobials:  Vanco 1/3 x1;    Zosyn 1/30 x1  Tamiflu 1/3 >1/8 Vancomycin 1/10 > 1/12 Ceftaz 1/10 > 1/12; 1/21 >1/22 Emycin 1/12 > 1/22 Cefepime 1/23 > 1/24 Vanc 1/21> 1/28 Colistin 2/19 > Meropenem 2/19 > 2/20 Ceftaz 2/20 > 2/23  Interim history/subjective:  No distress.  Resting in bed.  Tolerated tracheostomy change without difficulty.  Objective   Blood pressure 136/64, pulse 68, temperature 97.7 F (36.5 C), temperature source Oral, resp. rate 18, height 6' (1.829 m), weight 90.9 kg, SpO2 98 %.    FiO2 (%):  [28 %] 28 %   Intake/Output Summary (Last 24 hours) at 01/28/2019 1110 Last data filed at 01/28/2019 0900 Gross per 24 hour  Intake 1080 ml  Output 480 ml  Net 600 ml   Filed Weights   01/25/19 0440 01/26/19 0455 01/27/19 0712  Weight: 93.8 kg 91.5 kg 90.9 kg   EXAM:   General: Is a pleasant 63 year old white male currently resting in bed he is in no acute distress.  He continues to do well with tracheostomy HEENT: Normocephalic atraumatic no jugular venous distention mucous membranes are moist size 6 distal XLT tracheostomy is unremarkable, stoma unremarkable, able to tolerate phonation with finger occlusion Pulmonary: Scattered rhonchi, excellent cough mechanics Cardiac: Regular rate and rhythm without murmur rub or gallop Abdomen: Soft nontender PEG tube unremarkable  Neuro: Awake oriented no focal deficits  Resolved Hospital Problem list   AKI Pneumonia Hypokalemia Hypernatremia Cardiomyopathy evidenced via 1/4 ECHO, resolved.  Assessment & Plan:   Trach status False tracheal passage Hypoxemia Respiratory failure: Pseudomonas PNA  Discussion 63 year old male s/p respiratory failure due to Flu A that is now trached.    - First trach changed 3/3  from a cuffed distal XLT to a cuffless distal  XLT  Plan Complete 14 d Colistin  Wean oxygen  pulm hygiene  Will need bronch prior to assessment for decannulation to ensure false tracheal passage has resolved.   Erick Colace ACNP-BC [facility] Pulmonary/Critical Care Pager # [phone] OR # [phone] if no answer  Attending Note:  63 year old male s/p tracheostomy for flu A related respiratory failure.  Patient had a traumatic insertion of tracheostomy after a balloon rupture and was retrached with an XLT trach.  No events overnight, tolerating cuffless trach now but is XLT.  On exam, clear lungs bilaterally.  I reviewed CXR myself trach is in a good position.  Discussed with PCCM-NP.  Trach status:  - NPO after midnight  - Bronch in AM in the lab to evaluate airway prior to changing to shiley  - Maintain current size and type  Hypoxemia:  - Titrate O2 for sat of 88-92%  Airway trauma:  - Bronch in AM  - Attempt to decrease size of trach and evaluate for a shiley  PCCM will continue to follow  Patient seen and examined, agree with above note.  I dictated the care and orders written for this patient under my direction.  Rush Farmer, [place]

## 2019-01-28 NOTE — Progress Notes (Signed)
Physical Therapy Wound Treatment Patient Details  Name: Antonio [name]. MRN: [id number] Date of Birth: Jul 17, 1956  Today's Date: 01/28/2019 Time: 0932-1004 Time Calculation (min): 32 min  Subjective  Subjective: "Can I see it?" Patient and Family Stated Goals: Pt on trach collar Date of Onset: (unknown) Prior Treatments: dressing changes  Pain Score: Flinching with pulsatile lavage in superior region; Premedicated  Wound Assessment  Pressure Injury 12/01/18 Unstageable - Full thickness tissue loss in which the base of the ulcer is covered by slough (yellow, tan, gray, green or brown) and/or eschar (tan, brown or black) in the wound bed. sacrum (Active)  Wound Image   01/28/2019 11:16 AM  Dressing Type Barrier Film (skin prep);ABD;Moist to moist;Gauze (Comment);Other (Comment) 01/28/2019 11:16 AM  Dressing Clean;Dry;Intact;Changed 01/28/2019 11:16 AM  Dressing Change Frequency Daily 01/28/2019 11:16 AM  State of Healing Early/partial granulation 01/28/2019 11:16 AM  Site / Wound Assessment Red;Yellow;Pink 01/28/2019 11:16 AM  % Wound base Red or Granulating 60% 01/28/2019 11:16 AM  % Wound base Yellow/Fibrinous Exudate 40% 01/28/2019 11:16 AM  % Wound base Black/Eschar 0% 01/28/2019 11:16 AM  % Wound base Other/Granulation Tissue (Comment) 0% 01/28/2019 11:16 AM  Peri-wound Assessment Erythema (blanchable) 01/28/2019 11:16 AM  Wound Length (cm) 8.5 cm 01/28/2019 11:16 AM  Wound Width (cm) 5 cm 01/28/2019 11:16 AM  Wound Depth (cm) 1.5 cm 01/28/2019 11:16 AM  Wound Surface Area (cm^2) 42.5 cm^2 01/28/2019 11:16 AM  Wound Volume (cm^3) 63.75 cm^3 01/28/2019 11:16 AM  Tunneling (cm) 0 12/31/2018 10:28 AM  Margins Unattached edges (unapproximated) 01/28/2019 11:16 AM  Drainage Amount Minimal 01/28/2019 11:16 AM  Drainage Description Serosanguineous 01/28/2019 11:16 AM  Treatment Debridement (Selective);Hydrotherapy (Pulse lavage);Packing (Saline gauze);Tape changed 01/28/2019 11:16 AM      Hydrotherapy Pulsed lavage  therapy - wound location: sacrum Pulsed Lavage with Suction (psi): 8 psi(8-12) Pulsed Lavage with Suction - Normal Saline Used: 1000 mL Pulsed Lavage Tip: Tip with splash shield Selective Debridement Selective Debridement - Location: sacrum Selective Debridement - Tools Used: Forceps;Scalpel Selective Debridement - Tissue Removed: yellow necrotic tissue   Wound Assessment and Plan  Wound Therapy - Assess/Plan/Recommendations Wound Therapy - Clinical Statement: Wound decreasing in size. Slow removal of nonviable tissue. Continue with hydrotherapy to cleanse wound and decrease amount of nonviable tissue. Wound Therapy - Functional Problem List: decr mobility Factors Delaying/Impairing Wound Healing: Diabetes Mellitus;Immobility;Multiple medical problems Hydrotherapy Plan: Debridement;Dressing change;Patient/family education;Pulsatile lavage with suction Wound Therapy - Frequency: 3X / week Wound Therapy - Follow Up Recommendations: Skilled nursing facility Wound Plan: see above  Wound Therapy Goals- Improve the function of patient's integumentary system by progressing the wound(s) through the phases of wound healing (inflammation - proliferation - remodeling) by: Decrease Necrotic Tissue to: 30 Decrease Necrotic Tissue - Progress: Progressing toward goal Increase Granulation Tissue to: 70 Increase Granulation Tissue - Progress: Progressing toward goal Goals/treatment plan/discharge plan were made with and agreed upon by patient/family: Yes Time For Goal Achievement: 7 days Wound Therapy - Potential for Goals: Fair  Goals will be updated until maximal potential achieved or discharge criteria met.  Discharge criteria: when goals achieved, discharge from hospital, MD decision/surgical intervention, no progress towards goals, refusal/missing three consecutive treatments without notification or medical reason.  GP  [name], PT, DPT Acute Rehabilitation Services Pager  [phone] Office [phone]      [name] 01/28/2019, 11:20 AM

## 2019-01-28 NOTE — Progress Notes (Signed)
Occupational Therapy Treatment Patient Details Name: Antonio [name]. MRN: [id number] DOB: 18-Feb-1956 Today's Date: 01/28/2019    History of present illness 63 yo admitted with flu A and PNA with acute respiratory failure intubated 1/4, trach 1/16, PEG 1/28 with course complicated by acute MI, CHF and cardiogenic shock and mucous plugging. Transferred to ICU late 2/13/early 2/14 due to dyspnea and drop in sats with thick mucous.PMhx: DM, HTN, HLD, CAD, Lt BKA, rt toe amputation   OT comments  Pt able to ambulate to bathroom for attempted BM with 3 in 1 over toilet and stand at sink for washing hands. VSS on RA during mobility.   Follow Up Recommendations  SNF;Supervision/Assistance - 24 hour    Equipment Recommendations       Recommendations for Other Services      Precautions / Restrictions Precautions Precautions: Fall Required Braces or Orthoses: Other Brace Other Brace: LLE prothesis, PEG, PMV (make sure cuff is deflated)       Mobility Bed Mobility Overal bed mobility: Needs Assistance Bed Mobility: Supine to Sit     Supine to sit: Mod assist     General bed mobility comments: assist to raise trunk  Transfers Overall transfer level: Needs assistance Equipment used: Rolling walker (2 wheeled) Transfers: Sit to/from Stand Sit to Stand: Min assist;From elevated surface         General transfer comment: min assist to steady    Balance Overall balance assessment: Needs assistance   Sitting balance-Leahy Scale: Good       Standing balance-Leahy Scale: Poor Standing balance comment: leans on sink while washing hands                           ADL either performed or assessed with clinical judgement   ADL Overall ADL's : Needs assistance/impaired     Grooming: Wash/dry hands;Standing;Min guard           Upper Body Dressing : Minimal assistance;Bed level Upper Body Dressing Details (indicate cue type and reason): assist to tie Lower  Body Dressing: Moderate assistance;Sitting/lateral leans   Toilet Transfer: Minimal assistance;Ambulation;BSC;RW   Toileting- Clothing Manipulation and Hygiene: Total assistance;Sit to/from stand Toileting - Clothing Manipulation Details (indicate cue type and reason): after BM     Functional mobility during ADLs: Minimal assistance;Rolling walker;+2 for safety/equipment       Vision       Perception     Praxis      Cognition Arousal/Alertness: Awake/alert Behavior During Therapy: Flat affect Overall Cognitive Status: Impaired/Different from baseline Area of Impairment: Safety/judgement;Problem solving                         Safety/Judgement: Decreased awareness of safety;Decreased awareness of deficits              Exercises     Shoulder Instructions       General Comments      Pertinent Vitals/ Pain       Pain Assessment: No/denies pain  Home Living                                          Prior Functioning/Environment              Frequency  Min 2X/week        Progress Toward Goals  OT Goals(current goals can now be found in the care plan section)  Progress towards OT goals: Progressing toward goals  Acute Rehab OT Goals Patient Stated Goal: to be home by his birthday OT Goal Formulation: With patient Time For Goal Achievement: 02/11/19 Potential to Achieve Goals: Good  Plan Discharge plan remains appropriate    Co-evaluation    PT/OT/SLP Co-Evaluation/Treatment: Yes Reason for Co-Treatment: For patient/therapist safety   OT goals addressed during session: ADL's and self-care      AM-PAC OT "6 Clicks" Daily Activity     Outcome Measure   Help from another person eating meals?: Total Help from another person taking care of personal grooming?: A Little Help from another person toileting, which includes using toliet, bedpan, or urinal?: Total Help from another person bathing (including washing,  rinsing, drying)?: Total Help from another person to put on and taking off regular upper body clothing?: A Little Help from another person to put on and taking off regular lower body clothing?: A Lot 6 Click Score: 11    End of Session    OT Visit Diagnosis: Unsteadiness on feet (R26.81);Other abnormalities of gait and mobility (R26.89);Muscle weakness (generalized) (M62.81);Other symptoms and signs involving cognitive function   Activity Tolerance Patient tolerated treatment well   Patient Left in chair;with call bell/phone within reach;with chair alarm set   Nurse Communication          Time: 1456-1530 OT Time Calculation (min): 34 min  Charges: OT General Charges $OT Visit: 1 Visit OT Treatments $Self Care/Home Management : 8-22 mins  Martie Round, OTR/L Acute Rehabilitation Services Pager: [phone] Office: [phone]   Antonio [name] 01/28/2019, 3:48 PM

## 2019-01-28 NOTE — Consult Note (Addendum)
WOC Nurse wound follow up WOC team is following weekly for assessment of unstageable sacrum pressure injury.  Reviewed PT progress notes and photo in the chart from physicial therapy.  Wound is slowly receeding in size and there is decreasing amt slough; refer to PT notes for wound measurements and percentages.  Continue plan of care with Santyl for enzymatic debridement and hydrotherapy to assist with removal of nonviable tissue, air mattress to decrease pressure. WOC team will continue to assess wound appearance weekly. Cammie Mcgee MSN, RN, CWOCN, [place], CNS [phone]

## 2019-01-28 NOTE — Progress Notes (Signed)
  Speech Language Pathology Treatment: Antonio [name] Speaking valve  Patient Details Name: Antonio [name]. MRN: [id number] DOB: 1956/06/01 Today's Date: 01/28/2019 Time: 1550-1600 SLP Time Calculation (min) (ACUTE ONLY): 10 min  Assessment / Plan / Recommendation Clinical Impression  Treatment focused on PMV/voice.  Pt sitting in recliner.  Talking around #5 XL cuffless trach.  He tends to finger occlude rather than use PMV.  Valve placed; vital signs remained stable.  Pt achieved improved volume of voice; dysphonia remains present.  Intermittent verbal cues provided for deep inspiration prior to vocalization; speech intelligibility approaching 90%.  Pt is ready to repeat instrumental swallow study.  He adamantly refused MBS given his dislike of barium, but was amenable to bedside FEES procedure.  Per Antonio [name], pt for bronchoscopy tomorrow.  Will pursue FEES after bronch, either 3/4 or 3/5 depending on tomorrow's schedule.     HPI HPI: 63 year old male coming in with fever cough chest pain and shortness of breath was found to have influenza A. PMH: diabetes mellitus, hypertension.  Intubated multiple times 1/3 to 1/16 with trach placed 1/16.        SLP Plan  Continue with current plan of care       Recommendations  Diet recommendations: NPO      Patient may use Passy-Muir Speech Valve: Intermittently with supervision;During all therapies with supervision PMSV Supervision: Intermittent         Oral Care Recommendations: Oral care QID SLP Visit Diagnosis: Aphonia (R49.1) Plan: Continue with current plan of care       GO              Antonio [name] L. Antonio Frederic, MA CCC/SLP Acute Rehabilitation Services Office number [phone] Pager [phone]   Antonio [name] Antonio [name] 01/28/2019, 4:31 PM

## 2019-01-28 NOTE — Progress Notes (Signed)
[facility].  LTJ:[id number] DOB: January 15, 1956 DOA: 11/28/2018 PCP: Imagene Riches, NP    Brief Narrative:  63yo male with influenza A causing respiratory failure and cardiogenic shock. Prolonged mechanical ventilation, tracheostomy placed on January 16.  Significant Events:  1/6 self extubated  1/7 re-intubated  1/8 self extubated 1/9 acute respiratory failure, mucus plugging right lower lobe, re-intubated, bronchoscopy 1/10 New fever T Max 103.1, antibiotics resumed with ceftaz and vancomycin 1/11 Hemodynamically seems to be improving. Very hypernatremic. X-ray improving. Adding free water, one-time Lasix, follow-up chemistry a.m. Hoping to initiate weaning efforts on 1/12 1/16 Continuing diuresis and ongoing ventilatory support. Working on weaning but still has significant work of breathing. Plan has been to proceed with tracheostomy. Hopefully we can continue ongoing diuresis and continue to decrease sedation 1/20 still struggling to wean. Secretions and anxiety are barriers.  1/21 trach revision to 6 distal XLT, fever, increased secretions, abx broadened / bronch 1/22 ongoing thick secretions 1/24 Failed SBT, low grade fever, Per CXR persistent pleural effusions / basilar consolidation atelectasis vs pneumonia per bases 1/25 Mucus plugged with slow recovery after chest PT and Wound Care 2/3  first time weaned for greater than 15 minutes (previously failed due to agitation, secretions, tachypnea) 2/10 TRH assumed care 2/11 trach changed to a #5 cuffless per PCCM  2/13 trach changed to #4 shiley cuffless  2/13 afternoon - pulled out new trach and "disoldged" PEG - both reinserted/repositioned  2/13 PM > 2/14 AM acute hypoxic failure - emergent bedside bronch > trach obstructed - #6 cuffed trach placed > back to ICU on vent  2/14 trach changed to #5 cuffed 2/15 TRH resumed care - resp distress > back on vent  2/15  Failed ATC with  increased WOB 2/16  Failed ATC with increased WOB  2/21 back on pressure support 2/24 back on TC  01/27/19 - 5  XLT cuffless trach    Subjective:  Patient in bed, appears comfortable, denies any headache, no fever, no chest pain or pressure, no shortness of breath , no abdominal pain. No focal weakness.   Assessment & Plan:  Acute hypoxic respiratory failure - Influenza A / Corynebacterium pneumonia -was under the care of ICU, required multiple intubations thereafter tracheostomy, much improved after he received a treatment of Mucomyst on 01/26/2019 followed by suctioning, also activity has been increased he has been encouraged to sit up in chair, repeat Mucomyst treatment on 01/27/2019.  He is about to finish his IV ceftaz course with a stop date of 01/28/2019.  Clinically respiratory failure much improved.  Defer trach management to pulmonary, of note his trach was changed to 5 XLT cuffless trach on 01/27/2019 by pulmonary.   Acute systolic heart failure  -  no plans for cardiac cath per Cardiology - EF 20-25% via TTE 11/29/18, but improved to 60-65% on f/u 1/29 - likely due to sepsis - cont BB and ARB - monitor weight closely and monitor clinically closely.  Currently no need for diuretics.  Filed Weights   01/25/19 0440 01/26/19 0455 01/27/19 0712  Weight: 93.8 kg 91.5 kg 90.9 kg    Nutrition - status post PEG tube by IR - cont tube feeds - tolerating well - SLP to cont to follow - may soon be able to advance diet   Acute metabolic encephalopathy - Appears resolved at this time   Deconditioning - Awaiting placement in long term rehab - medically stable for d/c  when bed available     Sacral decub - Per wound care - General Surgery consulted on 2/21> no need for surgical debridement  DM2 - ISS  CBG (last 3)  Recent Labs    01/28/19 0019 01/28/19 0411 01/28/19 0829  GLUCAP 191* 188* 122*   Lab Results  Component Value Date   HGBA1C 7.3 (H) 01/21/2019     DVT prophylaxis: lovenox    Code Status: FULL CODE Family Communication: no family present at time of exam  Disposition Plan: stable for tele bed - cont trach care - complete abx tx - ready for d/c to SNF for rehab stay w/ ultimate plan for d/c home   Consultants:  PCCM Cardiology   Antimicrobials:  Ceftaz 2/20 >  Objective: Blood pressure 136/64, pulse 68, temperature 97.7 F (36.5 C), temperature source Oral, resp. rate 18, height 6' (1.829 m), weight 90.9 kg, SpO2 98 %.  Intake/Output Summary (Last 24 hours) at 01/28/2019 1058 Last data filed at 01/28/2019 0900 Gross per 24 hour  Intake 1080 ml  Output 480 ml  Net 600 ml   Filed Weights   01/25/19 0440 01/26/19 0455 01/27/19 0712  Weight: 93.8 kg 91.5 kg 90.9 kg    Examination:  Awake Alert, Oriented X 3, No new F.N deficits, Normal affect [place].AT,PERRAL Supple Neck,No JVD, No cervical lymphadenopathy appriciated.  Symmetrical Chest wall movement, Good air movement bilaterally, CTAB RRR,No Gallops, Rubs or new Murmurs, No Parasternal Heave +ve B.Sounds, Abd Soft, No tenderness, No organomegaly appriciated, No rebound - guarding or rigidity. No Cyanosis, Clubbing or edema,   trach site appears clean with trach collar in place, PEG tube in place site clean, left BKA, right first toe ray amputation   CBC: Recent Labs  Lab 01/24/19 0324 01/27/19 0458  WBC 5.1 5.7  HGB 9.2* 10.3*  HCT 30.6* 32.8*  MCV 88.7 87.9  PLT 199 419   Basic Metabolic Panel: Recent Labs  Lab 01/24/19 0324 01/27/19 0458  NA 136 136  K 4.1 4.3  CL 98 96*  CO2 32 33*  GLUCOSE 166* 177*  BUN 20 25*  CREATININE 0.43* 0.55*  CALCIUM 8.8* 9.0  MG 1.8 1.9  PHOS 3.7 4.3   GFR: Estimated Creatinine Clearance: 107.8 mL/min (A) (by C-G formula based on SCr of 0.55 mg/dL (L)).  Liver Function Tests: Recent Labs  Lab 01/27/19 0458  AST 14*  ALT 18  ALKPHOS 57  BILITOT 0.4  PROT 6.6  ALBUMIN 2.6*     HbA1C: Hgb A1c MFr Bld  Date/Time Value Ref Range Status   01/21/2019 04:01 AM 7.3 (H) 4.8 - 5.6 % Final    Comment:    (NOTE) Pre diabetes:          5.7%-6.4% Diabetes:              >6.4% Glycemic control for   <7.0% adults with diabetes   03/25/2017 05:59 AM 12.3 (H) 4.8 - 5.6 % Final    Comment:    (NOTE)         Pre-diabetes: 5.7 - 6.4         Diabetes: >6.4         Glycemic control for adults with diabetes: <7.0     CBG: Recent Labs  Lab 01/27/19 1842 01/27/19 2142 01/28/19 0019 01/28/19 0411 01/28/19 0829  GLUCAP 161* 171* 191* 188* 122*     Scheduled Meds: . arformoterol  15 mcg Nebulization BID  . aspirin  81 mg Per  Tube Daily  . atorvastatin  40 mg Per Tube q1800  . budesonide (PULMICORT) nebulizer solution  0.5 mg Nebulization BID  . carvedilol  3.125 mg Per Tube BID WC  . chlorhexidine gluconate (MEDLINE KIT)  15 mL Mouth Rinse BID  . collagenase   Topical Daily  . enoxaparin (LOVENOX) injection  40 mg Subcutaneous Q24H  . famotidine  20 mg Per Tube BID  . folic acid  1 mg Per Tube Daily  . free water  100 mL Per Tube Q8H  . guaiFENesin  5 mL Per Tube Q12H  . insulin aspart  0-20 Units Subcutaneous Q4H  . insulin detemir  35 Units Subcutaneous BID  . ipratropium-albuterol  3 mL Nebulization TID  . mouth rinse  15 mL Mouth Rinse QID  . multivitamin  15 mL Per Tube Daily  . nutrition supplement (JUVEN)  1 packet Per Tube BID BM  . tapentadol  50 mg Oral QID  . thiamine  100 mg Per Tube Daily     LOS: 61 days   Signature  Lala Lund M.D on 01/28/2019 at 10:58 AM   -  To page go to [email or web address]

## 2019-01-28 NOTE — Progress Notes (Signed)
Physical Therapy Treatment Patient Details Name: Antonio [name]. MRN: [id number] DOB: 1956/05/03 Today's Date: 01/28/2019    History of Present Illness 63 yo admitted with flu A and PNA with acute respiratory failure intubated 1/4, trach 1/16, PEG 1/28 with course complicated by acute MI, CHF and cardiogenic shock and mucous plugging. Transferred to ICU late 2/13/early 2/14 due to dyspnea and drop in sats with thick mucous.PMhx: DM, HTN, HLD, CAD, Lt BKA, rt toe amputation    PT Comments    Pt progressing well towards physical therapy goals, ambulating in room with walker and min assist for balance. Additionally, session focused on toileting and hygiene task at sink. SpO2 > 90% on RA via 28% ATC. Pt declining ambulation in hall due to "being too hot." Will continue to progress as tolerated.     Follow Up Recommendations  SNF     Equipment Recommendations  Rolling walker with 5" wheels;3in1 (PT)    Recommendations for Other Services       Precautions / Restrictions Precautions Precautions: Fall Required Braces or Orthoses: Other Brace Other Brace: LLE prothesis, PEG, PMV (make sure cuff is deflated) Restrictions Weight Bearing Restrictions: No    Mobility  Bed Mobility Overal bed mobility: Needs Assistance Bed Mobility: Supine to Sit     Supine to sit: Mod assist     General bed mobility comments: assist to raise trunk  Transfers Overall transfer level: Needs assistance Equipment used: Rolling walker (2 wheeled) Transfers: Sit to/from Stand Sit to Stand: Min assist;From elevated surface         General transfer comment: min assist to steady  Ambulation/Gait Ambulation/Gait assistance: Min Chemical engineer (Feet): 30 Feet Assistive device: Rolling walker (2 wheeled) Gait Pattern/deviations: Step-through pattern;Decreased stride length;Trunk flexed;Narrow base of support     General Gait Details: Crouched gait pattern with tactile/verbal cueing for  hip/knee extension   Stairs             Wheelchair Mobility    Modified Rankin (Stroke Patients Only)       Balance Overall balance assessment: Needs assistance   Sitting balance-Leahy Scale: Good       Standing balance-Leahy Scale: Poor Standing balance comment: leans on sink while washing hands                            Cognition Arousal/Alertness: Awake/alert Behavior During Therapy: Flat affect Overall Cognitive Status: Impaired/Different from baseline Area of Impairment: Safety/judgement;Problem solving                         Safety/Judgement: Decreased awareness of safety;Decreased awareness of deficits            Exercises      General Comments        Pertinent Vitals/Pain Pain Assessment: No/denies pain    Home Living                      Prior Function            PT Goals (current goals can now be found in the care plan section) Acute Rehab PT Goals Patient Stated Goal: to be home by his birthday PT Goal Formulation: With patient Time For Goal Achievement: 02/11/19 Potential to Achieve Goals: Good Progress towards PT goals: Progressing toward goals    Frequency    Min 3X/week      PT Plan Current plan  remains appropriate    Co-evaluation PT/OT/SLP Co-Evaluation/Treatment: Yes Reason for Co-Treatment: For patient/therapist safety PT goals addressed during session: Mobility/safety with mobility OT goals addressed during session: ADL's and self-care      AM-PAC PT "6 Clicks" Mobility   Outcome Measure  Help needed turning from your back to your side while in a flat bed without using bedrails?: None Help needed moving from lying on your back to sitting on the side of a flat bed without using bedrails?: A Lot Help needed moving to and from a bed to a chair (including a wheelchair)?: A Little Help needed standing up from a chair using your arms (e.g., wheelchair or bedside chair)?: A  Little Help needed to walk in hospital room?: A Little Help needed climbing 3-5 steps with a railing? : A Lot 6 Click Score: 17    End of Session Equipment Utilized During Treatment: Gait belt;Other (comment)(PMSV) Activity Tolerance: Patient tolerated treatment well Patient left: in chair;with call bell/phone within reach;with chair alarm set Nurse Communication: Mobility status PT Visit Diagnosis: Muscle weakness (generalized) (M62.81);Difficulty in walking, not elsewhere classified (R26.2)     Time: 1456-1530 PT Time Calculation (min) (ACUTE ONLY): 34 min  Charges:  $Gait Training: 8-22 mins                    Laurina Bustle, [place], DPT Acute Rehabilitation Services Pager [phone] Office [phone]    Vanetta Mulders 01/28/2019, 4:53 PM

## 2019-01-29 ENCOUNTER — Inpatient Hospital Stay (HOSPITAL_COMMUNITY): Payer: Medicaid Other

## 2019-01-29 DIAGNOSIS — S279XXA Injury of unspecified intrathoracic organ, initial encounter: Secondary | ICD-10-CM

## 2019-01-29 LAB — GLUCOSE, CAPILLARY
GLUCOSE-CAPILLARY: 178 mg/dL — AB (ref 70–99)
GLUCOSE-CAPILLARY: 133 mg/dL — AB (ref 70–99)
Glucose-Capillary: 189 mg/dL — ABNORMAL HIGH (ref 70–99)
Glucose-Capillary: 195 mg/dL — ABNORMAL HIGH (ref 70–99)
Glucose-Capillary: 152 mg/dL — ABNORMAL HIGH (ref 70–99)

## 2019-01-29 NOTE — Plan of Care (Signed)
Discussed with patient plan of care for the evening, pain management and next pain medication dose with some teach back displayed. Also, discussed cleaning of new trach for the morning with RT department.

## 2019-01-29 NOTE — Progress Notes (Signed)
Physical Therapy Treatment Patient Details Name: Antonio [name]. MRN: [id number] DOB: 1956-07-11 Today's Date: 01/29/2019    History of Present Illness Pt is a 63 y.o. male admitted 11/28/18 with flu A and PNA; acute respiratory failure intubated 1/4. Trach placed 1/16. PEG placed 1/28. Hospital course complicared by acute MI, CHF, cardiogenic shock, and mucous plugging. Also with sacral wound. PMH includes DM, HTN, HLD, CAD, L BKA, R transmet amputation.   PT Comments    Pt slowly progressing with mobility. Reports increased fatigue this session. ModA for bed mobility. Unable to stand without prosthetic donned without assist. Noted pads to be saturated; RN present for sacral dressing change. Pt declining further mobility secondary to fatigue.     Follow Up Recommendations  SNF     Equipment Recommendations  Rolling walker with 5" wheels;3in1 (PT)    Recommendations for Other Services       Precautions / Restrictions Precautions Precautions: Fall Precaution Comments: O2 via trach collar, PEG Restrictions Weight Bearing Restrictions: No    Mobility  Bed Mobility Overal bed mobility: Needs Assistance Bed Mobility: Supine to Sit     Supine to sit: Mod assist     General bed mobility comments: Able to roll and initiate movenet to EOB well; requires modA to assist trunk elevation, pt reports unable to use RUE to push due to pain/weakness from previous PICC line  Transfers Overall transfer level: Needs assistance Equipment used: Rolling walker (2 wheeled) Transfers: Sit to/from Stand           General transfer comment: Pt attempting to stand without prosthetic on in order to remove soiled bed linens from underneath him; unable to stand to RW despite momentum. When PT going to assist, pt stating, "Dont' touch me." Unable to stand on RLE without assist  Ambulation/Gait                 Stairs             Wheelchair Mobility    Modified Rankin (Stroke  Patients Only)       Balance Overall balance assessment: Needs assistance Sitting-balance support: Feet supported;No upper extremity supported;Bilateral upper extremity supported Sitting balance-Leahy Scale: Good                                      Cognition Arousal/Alertness: Awake/alert Behavior During Therapy: Flat affect Overall Cognitive Status: No family/caregiver present to determine baseline cognitive functioning Area of Impairment: Safety/judgement;Problem solving                         Safety/Judgement: Decreased awareness of safety;Decreased awareness of deficits   Problem Solving: Difficulty sequencing;Requires verbal cues;Requires tactile cues General Comments: Pt agitated and self-limiting      Exercises      General Comments        Pertinent Vitals/Pain Pain Assessment: Faces Faces Pain Scale: Hurts a little bit Pain Location: Sacral wound Pain Descriptors / Indicators: Grimacing;Discomfort Pain Intervention(s): Limited activity within patient's tolerance;Other (comment)(RN present to change dressings)    Home Living                      Prior Function            PT Goals (current goals can now be found in the care plan section) Acute Rehab PT Goals Patient Stated Goal: to  be home by his birthday PT Goal Formulation: With patient Time For Goal Achievement: 02/11/19 Potential to Achieve Goals: Good Progress towards PT goals: Not progressing toward goals - comment(Limited by fatigue)    Frequency    Min 3X/week      PT Plan Current plan remains appropriate    Co-evaluation              AM-PAC PT "6 Clicks" Mobility   Outcome Measure  Help needed turning from your back to your side while in a flat bed without using bedrails?: None Help needed moving from lying on your back to sitting on the side of a flat bed without using bedrails?: A Lot Help needed moving to and from a bed to a chair  (including a wheelchair)?: A Little Help needed standing up from a chair using your arms (e.g., wheelchair or bedside chair)?: A Little Help needed to walk in hospital room?: A Little Help needed climbing 3-5 steps with a railing? : Total 6 Click Score: 16    End of Session Equipment Utilized During Treatment: Gait belt;Oxygen(O2 via trach collar) Activity Tolerance: Treatment limited secondary to agitation Patient left: in bed;with call bell/phone within reach;with nursing/sitter in room Nurse Communication: Mobility status PT Visit Diagnosis: Muscle weakness (generalized) (M62.81);Difficulty in walking, not elsewhere classified (R26.2)     Time: 4034-7425 PT Time Calculation (min) (ACUTE ONLY): 11 min  Charges:  $Therapeutic Activity: 8-22 mins                    Ina Homes, PT, DPT Acute Rehabilitation Services  Pager [phone] Office [phone]  Malachy Chamber 01/29/2019, 5:38 PM

## 2019-01-29 NOTE — Procedures (Signed)
Bronchoscopy Procedure Note Antonio [name] [id number] 17-Nov-1956  Procedure: Bronchoscopy Indications: Diagnostic evaluation of the airways  Procedure Details Consent: Risks of procedure as well as the alternatives and risks of each were explained to the (patient/caregiver).  Consent for procedure obtained. Time Out: Verified patient identification, verified procedure, site/side was marked, verified correct patient position, special equipment/implants available, medications/allergies/relevent history reviewed, required imaging and test results available.  Performed  Sedation: None  Airway entered and the following bronchi were examined: RUL, RML, RLL, LUL, LLL and Bronchi.   Procedure done as a part of airway inspection for trach changes from an XLT to a shiley size 4 and trach is in a good position upon inspection, well above carina and off the posterior wall of the trachea. Bronchoscope removed.    Evaluation Hemodynamic Status: BP stable throughout; O2 sats: stable throughout Patient's Current Condition: stable Specimens:  None Complications: No apparent complications Patient did tolerate procedure well.  Antonio [name] 01/29/2019

## 2019-01-29 NOTE — Progress Notes (Addendum)
Nutrition Follow-up  DOCUMENTATION CODES:   Not applicable  INTERVENTION:  - Continue Osmolite 1.2via PEGat 81ml/h to provide 2160kcals, 100 gm protein, free water daily - Continue Juven BID. - Continue MVI daily. Total intake provides 2320 kcal, 128g protein, free water daily.   Additional free water (311ml/day) will provide a total of free water daily.   NUTRITION DIAGNOSIS:   Inadequate oral intake related to inability to eat as evidenced by NPO status.  Ongoing  GOAL:   Patient will meet greater than or equal to 90% of their needs  Progressing, meeting with TF  MONITOR:   PO intake, TF tolerance, Labs, Skin, Weight trends, I & O's  ASSESSMENT:   63 y/o M who presented [facility] 1/3 with fever, cough, chest pain & SOB. Pt found to be Influenza A positive. Developed progressive respiratory distress requiring intubation in the ER.    Spoke with pt who was sitting in bedside chair. Pt was able to speak some more at this visit.   Pt reports no n/v, diarrhea or constipation. Denies any more loose stools. Seems to be tolerating Osm well. Per WOC, pt wound is slowly receeding in size, and decreasing amount of slough.   SLP is still following; barium swallow is planned for today, plan to advance diet as tolerated. Per pt he is looking forward to comfort foods.   No questions or concerns at this time. Will continue to follow.   Medications reviewed and include: Pepcid, Folic acid, free water q 8 hr, insulin aspart 0-20 units, MVI, Juven, Thiamine Labs reviewed: CBG (118,172,218,189,195), Phos, K, and Mg WNL    Diet Order:   Diet Order            Diet NPO time specified  Diet effective midnight              EDUCATION NEEDS:   Not appropriate for education at this time  Skin:  Skin Assessment: Skin Integrity Issues: Skin Integrity Issues:: Stage I, Unstageable Stage I: L head Stage II: N/A Unstageable: sacrum  Last BM:   3/4  Height:   Ht Readings from Last 1 Encounters:  11/29/18 6' (1.829 m)    Weight:   Wt Readings from Last 1 Encounters:  01/29/19 90.7 kg    Ideal Body Weight:  76.1 kg(adjusted for L BKA)  BMI:  Body mass index is 27.12 kg/m.  Estimated Nutritional Needs:   Kcal:  2200-2400  Protein:  120-150 grams  Fluid:  > 2 L/day    [employer] Dietetic Intern

## 2019-01-29 NOTE — Progress Notes (Signed)
[name] TEAM [place].  BWI:[id number] DOB: July 28, 1956 DOA: 11/28/2018 PCP: Antonio Riches, NP    Brief Narrative:  63yo male with influenza A causing respiratory failure and cardiogenic shock. Prolonged mechanical ventilation, tracheostomy placed on January 16.  Significant Events:  1/6 self extubated  1/7 re-intubated  1/8 self extubated 1/9 acute respiratory failure, mucus plugging right lower lobe, re-intubated, bronchoscopy 1/10 New fever T Max 103.1, antibiotics resumed with ceftaz and vancomycin 1/11 Hemodynamically seems to be improving. Very hypernatremic. X-ray improving. Adding free water, one-time Lasix, follow-up chemistry a.m. Hoping to initiate weaning efforts on 1/12 1/16 Continuing diuresis and ongoing ventilatory support. Working on weaning but still has significant work of breathing. Plan has been to proceed with tracheostomy. Hopefully we can continue ongoing diuresis and continue to decrease sedation 1/20 still struggling to wean. Secretions and anxiety are barriers.  1/21 trach revision to 6 distal XLT, fever, increased secretions, abx broadened / bronch 1/22 ongoing thick secretions 1/24 Failed SBT, low grade fever, Per CXR persistent pleural effusions / basilar consolidation atelectasis vs pneumonia per bases 1/25 Mucus plugged with slow recovery after chest PT and Wound Care 2/3  first time weaned for greater than 15 minutes (previously failed due to agitation, secretions, tachypnea) 2/10 TRH assumed care 2/11 trach changed to a #5 cuffless per PCCM  2/13 trach changed to #4 shiley cuffless  2/13 afternoon - pulled out new trach and "disoldged" PEG - both reinserted/repositioned  2/13 PM > 2/14 AM acute hypoxic failure - emergent bedside bronch > trach obstructed - #6 cuffed trach placed > back to ICU on vent  2/14 trach changed to #5 cuffed 2/15 TRH resumed care - resp distress > back on vent  2/15  Failed ATC with  increased WOB 2/16  Failed ATC with increased WOB  2/21 back on pressure support 2/24 back on TC  01/27/19 - 5  XLT cuffless trach    Subjective:  Patient in bed, appears comfortable, denies any headache, no fever, no chest pain or pressure, no shortness of breath , no abdominal pain. No focal weakness.    Assessment & Plan:  Acute hypoxic respiratory failure - Influenza A / Corynebacterium pneumonia -was under the care of ICU, required multiple intubations thereafter tracheostomy, much improved after he received a treatment of Mucomyst on 01/26/2019 followed by suctioning, also activity has been increased he has been encouraged to sit up in chair, repeat Mucomyst treatment on 01/27/2019.  He is about to finish his IV ceftaz course with a stop date of 01/28/2019.  Clinically respiratory failure much improved.  Defer trach management to pulmonary, of note his trach was changed to 5 XLT cuffless trach on 01/27/2019 by pulmonary.   Acute systolic heart failure  -  no plans for cardiac cath per Cardiology - EF 20-25% via TTE 11/29/18, but improved to 60-65% on f/u 1/29 - likely due to sepsis - cont BB and ARB - monitor weight closely and monitor clinically closely.  Currently no need for diuretics.  Filed Weights   01/26/19 0455 01/27/19 0712 01/29/19 0550  Weight: 91.5 kg 90.9 kg 90.7 kg    Dysphagia.  Due to tracheostomy.  Currently on PEG tube feeds, speech therapy following due for barium swallow today, advance diet as tolerated per speech.  Currently has a cuffless trach.  Acute metabolic encephalopathy - resolved.  Deconditioning -stable will require placement.     Sacral decub - Per wound care -  General Surgery consulted on 2/21> no need for surgical debridement.  DM2 - ISS  CBG (last 3)  Recent Labs    01/28/19 2046 01/29/19 0406 01/29/19 0842  GLUCAP 218* 189* 195*   Lab Results  Component Value Date   HGBA1C 7.3 (H) 01/21/2019     DVT prophylaxis: lovenox  Code Status:  FULL CODE Family Communication: no family present at time of exam  Disposition Plan: stable for tele bed - cont trach care - complete abx tx - ready for d/c to SNF for rehab stay w/ ultimate plan for d/c home   Consultants:  PCCM Cardiology   Antimicrobials:  Ceftaz 2/20 >  Objective: Blood pressure 134/66, pulse 72, temperature 98.2 F (36.8 C), temperature source Oral, resp. rate 18, height 6' (1.829 m), weight 90.7 kg, SpO2 97 %.  Intake/Output Summary (Last 24 hours) at 01/29/2019 0924 Last data filed at 01/29/2019 0548 Gross per 24 hour  Intake 525 ml  Output 590 ml  Net -65 ml   Filed Weights   01/26/19 0455 01/27/19 0712 01/29/19 0550  Weight: 91.5 kg 90.9 kg 90.7 kg    Examination:  Awake Alert, Oriented X 3, No new F.N deficits, Normal affect [place].AT,PERRAL Supple Neck,No JVD, No cervical lymphadenopathy appriciated.  Symmetrical Chest wall movement, Good air movement bilaterally, CTAB RRR,No Gallops, Rubs or new Murmurs, No Parasternal Heave +ve B.Sounds, Abd Soft, No tenderness, No organomegaly appriciated, No rebound - guarding or rigidity. No Cyanosis, Clubbing or edema, Trach site appears clean with trach collar in place, PEG tube in place site clean, left BKA, right first toe ray amputation   CBC: Recent Labs  Lab 01/24/19 0324 01/27/19 0458  WBC 5.1 5.7  HGB 9.2* 10.3*  HCT 30.6* 32.8*  MCV 88.7 87.9  PLT 199 989   Basic Metabolic Panel: Recent Labs  Lab 01/24/19 0324 01/27/19 0458  NA 136 136  K 4.1 4.3  CL 98 96*  CO2 32 33*  GLUCOSE 166* 177*  BUN 20 25*  CREATININE 0.43* 0.55*  CALCIUM 8.8* 9.0  MG 1.8 1.9  PHOS 3.7 4.3   GFR: Estimated Creatinine Clearance: 107.8 mL/min (A) (by C-G formula based on SCr of 0.55 mg/dL (L)).  Liver Function Tests: Recent Labs  Lab 01/27/19 0458  AST 14*  ALT 18  ALKPHOS 57  BILITOT 0.4  PROT 6.6  ALBUMIN 2.6*     HbA1C: Hgb A1c MFr Bld  Date/Time Value Ref Range Status  01/21/2019 04:01  AM 7.3 (H) 4.8 - 5.6 % Final    Comment:    (NOTE) Pre diabetes:          5.7%-6.4% Diabetes:              >6.4% Glycemic control for   <7.0% adults with diabetes   03/25/2017 05:59 AM 12.3 (H) 4.8 - 5.6 % Final    Comment:    (NOTE)         Pre-diabetes: 5.7 - 6.4         Diabetes: >6.4         Glycemic control for adults with diabetes: <7.0     CBG: Recent Labs  Lab 01/28/19 1224 01/28/19 1628 01/28/19 2046 01/29/19 0406 01/29/19 0842  GLUCAP 118* 172* 218* 189* 195*     Scheduled Meds: . arformoterol  15 mcg Nebulization BID  . aspirin  81 mg Per Tube Daily  . atorvastatin  40 mg Per Tube q1800  . budesonide (PULMICORT) nebulizer solution  0.5 mg Nebulization BID  . carvedilol  3.125 mg Per Tube BID WC  . chlorhexidine gluconate (MEDLINE KIT)  15 mL Mouth Rinse BID  . collagenase   Topical Daily  . enoxaparin (LOVENOX) injection  40 mg Subcutaneous Q24H  . famotidine  20 mg Per Tube BID  . folic acid  1 mg Per Tube Daily  . free water  100 mL Per Tube Q8H  . guaiFENesin  5 mL Per Tube Q12H  . insulin aspart  0-20 Units Subcutaneous Q4H  . insulin detemir  35 Units Subcutaneous BID  . ipratropium-albuterol  3 mL Nebulization TID  . mouth rinse  15 mL Mouth Rinse QID  . multivitamin  15 mL Per Tube Daily  . nutrition supplement (JUVEN)  1 packet Per Tube BID BM  . tapentadol  50 mg Oral QID  . thiamine  100 mg Per Tube Daily     LOS: 62 days   Signature  Lala Lund M.D on 01/29/2019 at 9:24 AM   -  To page go to [email or web address]

## 2019-01-29 NOTE — Progress Notes (Signed)
NAME:  Antonio Hupfer., MRN:  [id number], DOB:  1956-04-30, LOS: 33 ADMISSION DATE:  11/28/2018, CONSULTATION DATE:  1/3 REFERRING MD:  EDP, CHIEF COMPLAINT:  Dyspnea   Brief History   63 y/o M admitted 1/3 with influenza A causing respiratory failure and cardiogenic shock. Prolonged mechanical ventilation, tracheostomy placed on January 16.  Developed HCAP.  Difficult wean, has failed multiple attempts at downsize with mucus plugging. Repeat resp cx 2/18 Pseduomonas.  Past Medical History  DM2, CAD, HTN, HLD, tobacco use  Significant Hospital Events   1/03  Admit, Influenza A positive 1/06  Self extubated  1/07  Re-intubated  1/08  Self-extubated  1/09  Reintubated, mucus plugging on FOB 1/10  Fever to 103, abx restarted  1/16  Diuresing, increased WOB with weaning efforts.  Trach.  1/20  Failed wean, anxiety / secretions 1/21  Trach revision to XLT distal.  FOB  1/22  Thick secretions barrier to wean  1/24  Failed SBT, fever 1/25  Episode of mucus plugging after chest PT/wound care  2/03  Weaned 15 minutes  2/11  ATC  2/14  Trach changed to #4, mucus plugging >back to #5 XLT 2/15  Failed ATC with increased WOB 2/16  Failed ATC with increased WOB  2/21 back on pressure support 3/3  Changed to cuffless xlt trach  Consults:  PCCM Cardiology  General surgery 2/21 for sacral wound  Procedures:  ETT 1/3 >>1/6, 1/7 > 1/8> 1/9> 1/16 Trach 1/16 >>  PEG 1/28 >>     Significant Diagnostic Tests:  UDS 1/4 >negative  ECHO 1/4 >LVEF 20-25%, akinesis of the anteroseptal, anterolateral & apical myocardium, grade 1 diastolic dysfunction, small pericardial effusion without evidence of hemodynamic compromise. CT Chest / ABD / Pelvis 1/3 > airway thickening, motion artifact, CAD Echo 1/29> LVEF 60-65%, left ventricular hypertrophy, trivial pericardial effusion  Micro Data:  RVP 1/3 > positive for influenza A BCx2 1/3 >No growth UC 1/4 >No Growth Sputum Cx 1/7 > Normal  respiratory flora BAL >corynebacterium  Blood Cx 1/10 > neg Sputum 1/21 > abundant corynebacterium striatum Sputum 2/18 > Pseudomonas  Antimicrobials:  Vanco 1/3 x1;    Zosyn 1/30 x1  Tamiflu 1/3 >1/8 Vancomycin 1/10 > 1/12 Ceftaz 1/10 > 1/12; 1/21 >1/22 Emycin 1/12 > 1/22 Cefepime 1/23 > 1/24 Vanc 1/21> 1/28 Colistin 2/19 > Meropenem 2/19 > 2/20 Ceftaz 2/20 > 2/23  Interim history/subjective:  No events overnight, tolerated trach change well  Objective   Blood pressure 99/65, pulse 72, temperature 97.8 F (36.6 C), temperature source Oral, resp. rate 20, height 6' (1.829 m), weight 90.7 kg, SpO2 95 %.    FiO2 (%):  [25 %-28 %] 28 %   Intake/Output Summary (Last 24 hours) at 01/29/2019 1339 Last data filed at 01/29/2019 0945 Gross per 24 hour  Intake 525 ml  Output 790 ml  Net -265 ml   Filed Weights   01/26/19 0455 01/27/19 0712 01/29/19 0550  Weight: 91.5 kg 90.9 kg 90.7 kg   EXAM:   General: Chronically ill appearing male, NAD HEENT: [place]/AT, PERRL, EOM-I and MMM.  Trach in place and site is clean Pulmonary: Coarse BS diffusely Cardiac: RRR, Nl S1/S2 and -M/R/G Abdomen: Soft, NT, ND and +BS Neuro: Awake and interactive, moving all available ext to command  I reviewed CXR myself, trach is in a good position  Resolved Hospital Problem list   AKI Pneumonia Hypokalemia Hypernatremia Cardiomyopathy evidenced via 1/4 ECHO, resolved.  Assessment & Plan:  Trach status False tracheal passage Hypoxemia Respiratory failure: Pseudomonas PNA  Discussed with PCCM-NP  Discussion 63 year old male s/p respiratory failure due to Flu A that is now trached.   First trach changed 3/3  from a cuffed distal XLT to a cuffless distal XLT Complete 14 d Colistin  Wean oxygen  pulm hygiene   Trach status:  - Bronch today in the lab to evaluate airway prior to changing to shiley  Hypoxemia:  - Titrate O2 for sat of 88-92%  Airway trauma:  - Bronch today  - Attempt  to decrease size of trach and evaluate for a shiley, if tolerated then will change to a 4 shiley cuffless then see again on Friday and cap.  If cap is tolerated over the weekend then will decannulate on Monday.  PCCM will continue to follow  Rush Farmer, M.D. [facility] Pulmonary/Critical Care Medicine. Pager: [phone]. After hours pager: [phone].

## 2019-01-29 NOTE — Progress Notes (Signed)
Assisted physician in routine trach change.  MD used bronchoscope to visualize the airway as 5.0 uncuffed XLT shiley was removed, and a 4.0 uncuffed shiley was re-inserted.  No complications noted.  VSS through out the procedure.  RN Lelon Mast notified of procedure.  Patient transferred back to his room.

## 2019-01-30 ENCOUNTER — Inpatient Hospital Stay (HOSPITAL_COMMUNITY): Payer: Medicaid Other

## 2019-01-30 LAB — GLUCOSE, CAPILLARY
Glucose-Capillary: 111 mg/dL — ABNORMAL HIGH (ref 70–99)
Glucose-Capillary: 118 mg/dL — ABNORMAL HIGH (ref 70–99)
Glucose-Capillary: 135 mg/dL — ABNORMAL HIGH (ref 70–99)
Glucose-Capillary: 172 mg/dL — ABNORMAL HIGH (ref 70–99)
Glucose-Capillary: 186 mg/dL — ABNORMAL HIGH (ref 70–99)
Glucose-Capillary: 211 mg/dL — ABNORMAL HIGH (ref 70–99)

## 2019-01-30 MED ORDER — RESOURCE THICKENUP CLEAR PO POWD
ORAL | Status: DC | PRN
  Filled 2019-01-30 (×3): qty 125

## 2019-01-30 MED ORDER — IPRATROPIUM-ALBUTEROL 0.5-2.5 (3) MG/3ML IN SOLN: 3.0000 mL | Freq: Four times a day (QID) | RESPIRATORY_TRACT | Status: DC | PRN

## 2019-01-30 NOTE — Progress Notes (Signed)
NAME:  Antonio [name]., MRN:  [id number], DOB:  1956/05/22, LOS: 67 ADMISSION DATE:  11/28/2018, CONSULTATION DATE:  1/3 REFERRING MD:  EDP, CHIEF COMPLAINT:  Dyspnea   Brief History   63 y/o M admitted 1/3 with influenza A causing respiratory failure and cardiogenic shock. Prolonged mechanical ventilation, tracheostomy placed on January 16.  Developed HCAP.  Difficult wean, has failed multiple attempts at downsize with mucus plugging. Repeat resp cx 2/18 Pseduomonas.  Past Medical History  DM2, CAD, HTN, HLD, tobacco use  Significant Hospital Events   1/03  Admit, Influenza A positive 1/06  Self extubated  1/07  Re-intubated  1/08  Self-extubated  1/09  Reintubated, mucus plugging on FOB 1/10  Fever to 103, abx restarted  1/16  Diuresing, increased WOB with weaning efforts.  Trach.  1/20  Failed wean, anxiety / secretions 1/21  Trach revision to XLT distal.  FOB  1/22  Thick secretions barrier to wean  1/24  Failed SBT, fever 1/25  Episode of mucus plugging after chest PT/wound care  2/03  Weaned 15 minutes  2/11  ATC  2/14  Trach changed to #4, mucus plugging >back to #5 XLT 2/15  Failed ATC with increased WOB 2/16  Failed ATC with increased WOB  2/21 back on pressure support 3/3  Changed to cuffless xlt trach  3/5: Brought to bronchoscopy suite where underwent bronchoscopy, the XLT Shiley was pulled back, extensive evaluation of the posterior tracheal wall was performed there was no evidence of current false passages so size 4 cuffless tracheostomy was placed 3/6: Capping trials initiated Consults:  PCCM Cardiology  General surgery 2/21 for sacral wound  Procedures:  ETT 1/3 >>1/6, 1/7 > 1/8> 1/9> 1/16 Trach 1/16 >>  PEG 1/28 >>     Significant Diagnostic Tests:  UDS 1/4 >negative  ECHO 1/4 >LVEF 20-25%, akinesis of the anteroseptal, anterolateral & apical myocardium, grade 1 diastolic dysfunction, small pericardial effusion without evidence of hemodynamic  compromise. CT Chest / ABD / Pelvis 1/3 > airway thickening, motion artifact, CAD Echo 1/29> LVEF 60-65%, left ventricular hypertrophy, trivial pericardial effusion  Micro Data:  RVP 1/3 > positive for influenza A BCx2 1/3 >No growth UC 1/4 >No Growth Sputum Cx 1/7 > Normal respiratory flora BAL >corynebacterium  Blood Cx 1/10 > neg Sputum 1/21 > abundant corynebacterium striatum Sputum 2/18 > Pseudomonas  Antimicrobials:  Vanco 1/3 x1;    Zosyn 1/30 x1  Tamiflu 1/3 >1/8 Vancomycin 1/10 > 1/12 Ceftaz 1/10 > 1/12; 1/21 >1/22 Emycin 1/12 > 1/22 Cefepime 1/23 > 1/24 Vanc 1/21> 1/28 Colistin 2/19 > Meropenem 2/19 > 2/20 Ceftaz 2/20 > 2/23  Interim history/subjective:  No events overnight  Objective   Blood pressure 120/62, pulse 79, temperature 98.5 F (36.9 C), temperature source Oral, resp. rate 18, height 6' (1.829 m), weight 90.4 kg, SpO2 96 %.    FiO2 (%):  [28 %] 28 %   Intake/Output Summary (Last 24 hours) at 01/30/2019 1043 Last data filed at 01/30/2019 0900 Gross per 24 hour  Intake 1206.25 ml  Output 1050 ml  Net 156.25 ml   Filed Weights   01/27/19 0712 01/29/19 0550 01/30/19 0500  Weight: 90.9 kg 90.7 kg 90.4 kg   EXAM:      Resolved Hospital Problem list   AKI Pneumonia Hypokalemia Hypernatremia Cardiomyopathy evidenced via 1/4 ECHO, resolved.  Assessment & Plan:   Trach status False tracheal passage: Resolved Hypoxemia Respiratory failure: Pseudomonas PNA  Discussion Underwent bronchoscopy yesterday  on 3/5 to evaluate for tracheal injury there was no evidence of this so we downsized him to size 4.  He has been tolerating this well.  Plan Initiate capping trials Continue pulse oximetry over the weekend If tolerates well over the weekend we will plan for decannulation on Monday   Peter E Babcock ACNP-BC [place] Pager # [phone] OR # [phone] if no answer  Attending Note:  63 year old male with acute  respiratory failure due to Flu A who was trached.  Downsized to a cuffless 4 on Thursday.  No events overnight.  On exam, trach is capped and patient has no issues with clear lungs.  I reviewed CXR myself, trach is in a good position.  Discussed with PCCM-NP.  Hypoxemia:   - Titrate O2 for sat of 88-92%  - Change to [place] from trach collar  Flu A  - Resolved, monitor clinically  Trach status:  - Cap trach  - If remains capped through Monday then will decannulateon Monday  PCCM will see again on Monday  Patient seen and examined, agree with above note.  I dictated the care and orders written for this patient under my direction.  Rush Farmer, [place]

## 2019-01-30 NOTE — Progress Notes (Signed)
  Speech Language Pathology Treatment: Dysphagia;Passy Muir Speaking valve  Patient Details Name: Antonio [name]. MRN: [id number] DOB: Feb 14, 1956 Today's Date: 01/30/2019 Time: 9150-5697 SLP Time Calculation (min) (ACUTE ONLY): 23 min  Assessment / Plan / Recommendation Clinical Impression  ST has been following pt for extended period of time however this therapist has not worked with him since 2/13. He has made excellent improvement in regards to respiratory volume/support, vocal quality/intensity, secretion management and cognition (although ST not treating cognition). Conversational discourse was 100% intelligible throughout session with RR, HR and SpO2 stable for approximately 18 minutes.   Oral care followed by puree trials with pudding which he was very excited to receive. Mild verbal cues for second swallows due to pharyngeal residue on prior MBS and volitional coughs. Signs of possible airway intrusion present including delayed throat clears and unsolicited coughs. He is ready for repeat swallow study and was agreeable to MBS.   As SLP documenting, overheard critical care NP telling RN he has capped his trach which will provide normal pressures required for bolus transit. MBS scheduled for today at 1330.   HPI HPI: 64 year old male coming in with fever cough chest pain and shortness of breath was found to have influenza A. PMH: diabetes mellitus, hypertension.  Intubated multiple times 1/3 to 1/16 with trach placed 1/16.        SLP Plan  MBS       Recommendations  Diet recommendations: NPO Medication Administration: Via alternative means      Patient may use Passy-Muir Speech Valve: During all waking hours (remove during sleep) PMSV Supervision: Intermittent         Oral Care Recommendations: Oral care QID Follow up Recommendations: (TBD) SLP Visit Diagnosis: Aphonia (R49.1);Dysphagia, unspecified (R13.10) Plan: MBS       GO                Royce Macadamia 01/30/2019, 10:50 AM  Breck Coons Lonell Face.Ed Nurse, children's [phone] Office [phone]

## 2019-01-30 NOTE — Progress Notes (Signed)
Occupational Therapy Treatment Patient Details Name: Antonio [name]. MRN: [id number] DOB: 1956/10/18 Today's Date: 01/30/2019    History of present illness Pt is a 63 y.o. male admitted 11/28/18 with flu A and PNA; acute respiratory failure intubated 1/4. Trach placed 1/16. PEG placed 1/28. Hospital course complicared by acute MI, CHF, cardiogenic shock, and mucous plugging. Also with sacral wound. PMH includes DM, HTN, HLD, CAD, L BKA, R transmet amputation.   OT comments  Pt seen for ADL training at EOB. Performed grooming with set up, UB dressing with min assist and worked on R LE dressing. Progressing steadily. Goals updated.   Follow Up Recommendations  SNF;Supervision/Assistance - 24 hour    Equipment Recommendations       Recommendations for Other Services      Precautions / Restrictions Precautions Precautions: Fall Precaution Comments: O2 via trach collar, PEG Required Braces or Orthoses: Other Brace Other Brace: LLE prothesis, PEG, PMV (make sure cuff is deflated) Restrictions LLE Weight Bearing: Weight bearing as tolerated       Mobility Bed Mobility Overal bed mobility: Needs Assistance Bed Mobility: Supine to Sit;Sit to Supine     Supine to sit: Mod assist Sit to supine: Min assist   General bed mobility comments: mod assist to raise trunk, min for R LE into bed  Transfers                      Balance Overall balance assessment: Needs assistance   Sitting balance-Leahy Scale: Good                                     ADL either performed or assessed with clinical judgement   ADL Overall ADL's : Needs assistance/impaired     Grooming: Wash/dry hands;Wash/dry face;Oral care;Sitting;Set up           Upper Body Dressing : Minimal assistance;Sitting Upper Body Dressing Details (indicate cue type and reason): assist to tie Lower Body Dressing: Moderate assistance;Sitting/lateral leans Lower Body Dressing Details  (indicate cue type and reason): worked on [employer]      Cognition Arousal/Alertness: Awake/alert Behavior During Therapy: Flat affect Overall Cognitive Status: No family/caregiver present to determine baseline cognitive functioning Area of Impairment: Safety/judgement;Problem solving                                        Exercises     Shoulder Instructions       General Comments      Pertinent Vitals/ Pain       Pain Assessment: Faces Faces Pain Scale: Hurts whole lot Pain Location: Sacral wound Pain Descriptors / Indicators: Grimacing;Discomfort Pain Intervention(s): Monitored during session;Repositioned  Home Living                                          Prior Functioning/Environment              Frequency  Min 2X/week        Progress Toward Goals  OT Goals(current goals can now be found in the care plan section)  Progress towards OT goals: Progressing toward goals  Acute Rehab OT Goals Patient Stated Goal: to be home by his birthday OT Goal Formulation: With patient Time For Goal Achievement: 02/11/19 Potential to Achieve Goals: Good  Plan Discharge plan remains appropriate    Co-evaluation                 AM-PAC OT "6 Clicks" Daily Activity     Outcome Measure   Help from another person eating meals?: Total Help from another person taking care of personal grooming?: None Help from another person toileting, which includes using toliet, bedpan, or urinal?: Total Help from another person bathing (including washing, rinsing, drying)?: A Lot Help from another person to put on and taking off regular upper body clothing?: A Little Help from another person to put on and taking off regular lower body clothing?: A Lot 6 Click Score: 13    End of Session Equipment Utilized During Treatment: Oxygen  OT Visit Diagnosis: Unsteadiness on  feet (R26.81);Other abnormalities of gait and mobility (R26.89);Muscle weakness (generalized) (M62.81);Other symptoms and signs involving cognitive function   Activity Tolerance Patient tolerated treatment well   Patient Left in bed;with call bell/phone within reach;with bed alarm set   Nurse Communication          Time: [phone] OT Time Calculation (min): 20 min  Charges: OT General Charges $OT Visit: 1 Visit OT Treatments $Self Care/Home Management : 8-22 mins  Martie Round, OTR/L Acute Rehabilitation Services Pager: [phone] Office: [phone]   Evern Bio 01/30/2019, 3:36 PM

## 2019-01-30 NOTE — Progress Notes (Signed)
Physical Therapy Wound Treatment Patient Details  Name: Antonio [name]. MRN: [id number] Date of Birth: 1956-04-12  Today's Date: 01/30/2019 Time: 1030-1110 Time Calculation (min): 40 min  Subjective  Subjective: Pt sitting up in chair on arrival and assisted back to bed.  Patient and Family Stated Goals: "go home." Date of Onset: (unknown) Prior Treatments: dressing changes  Pain Score:  Premedicated  Wound Assessment  Pressure Injury 12/01/18 Unstageable - Full thickness tissue loss in which the base of the ulcer is covered by slough (yellow, tan, gray, green or brown) and/or eschar (tan, brown or black) in the wound bed. sacrum (Active)  Dressing Type Barrier Film (skin prep);ABD;Moist to moist;Gauze (Comment);Other (Comment) 01/30/2019  4:12 PM  Dressing Clean;Dry;Intact;Changed 01/30/2019  4:12 PM  Dressing Change Frequency Daily 01/30/2019  4:12 PM  State of Healing Early/partial granulation 01/30/2019  4:12 PM  Site / Wound Assessment Red;Yellow;Pink 01/30/2019  4:12 PM  % Wound base Red or Granulating 60% 01/30/2019  4:12 PM  % Wound base Yellow/Fibrinous Exudate 40% 01/30/2019  4:12 PM  % Wound base Black/Eschar 0% 01/30/2019  4:12 PM  % Wound base Other/Granulation Tissue (Comment) 0% 01/30/2019  4:12 PM  Peri-wound Assessment Erythema (blanchable) 01/30/2019  4:12 PM  Wound Length (cm) 8.5 cm 01/28/2019 11:16 AM  Wound Width (cm) 5 cm 01/28/2019 11:16 AM  Wound Depth (cm) 1.5 cm 01/28/2019 11:16 AM  Wound Surface Area (cm^2) 42.5 cm^2 01/28/2019 11:16 AM  Wound Volume (cm^3) 63.75 cm^3 01/28/2019 11:16 AM  Tunneling (cm) 0 12/31/2018 10:28 AM  Margins Unattached edges (unapproximated) 01/30/2019  4:12 PM  Drainage Amount Minimal 01/30/2019  4:12 PM  Drainage Description Serosanguineous 01/30/2019  4:12 PM  Treatment Debridement (Selective);Hydrotherapy (Pulse lavage);Packing (Saline gauze);Tape changed 01/30/2019  4:12 PM   Santyl applied to wound bed prior to applying dressing.     Hydrotherapy Pulsed lavage therapy - wound location: sacrum Pulsed Lavage with Suction (psi): 8 psi(8-12) Pulsed Lavage with Suction - Normal Saline Used: 1000 mL Pulsed Lavage Tip: Tip with splash shield Selective Debridement Selective Debridement - Location: sacrum Selective Debridement - Tools Used: Forceps;Scalpel Selective Debridement - Tissue Removed: yellow necrotic tissue   Wound Assessment and Plan  Wound Therapy - Assess/Plan/Recommendations Wound Therapy - Clinical Statement: Minimal removal of yellow slough. Continue with hydrotherapy to cleanse wound and decrease amount of nonviable tissue. Wound Therapy - Functional Problem List: decr mobility Factors Delaying/Impairing Wound Healing: Diabetes Mellitus;Immobility;Multiple medical problems Hydrotherapy Plan: Debridement;Dressing change;Patient/family education;Pulsatile lavage with suction Wound Therapy - Frequency: 3X / week Wound Therapy - Follow Up Recommendations: Skilled nursing facility Wound Plan: see above  Wound Therapy Goals- Improve the function of patient's integumentary system by progressing the wound(s) through the phases of wound healing (inflammation - proliferation - remodeling) by: Decrease Necrotic Tissue to: 30 Decrease Necrotic Tissue - Progress: Progressing toward goal Increase Granulation Tissue to: 70 Increase Granulation Tissue - Progress: Progressing toward goal Goals/treatment plan/discharge plan were made with and agreed upon by patient/family: Yes Time For Goal Achievement: 7 days Wound Therapy - Potential for Goals: Fair  Goals will be updated until maximal potential achieved or discharge criteria met.  Discharge criteria: when goals achieved, discharge from hospital, MD decision/surgical intervention, no progress towards goals, refusal/missing three consecutive treatments without notification or medical reason.  GP    [name], PT, DPT Acute Rehabilitation Services Pager  [phone] Office [phone]   [name] 01/30/2019, 4:14 PM

## 2019-01-30 NOTE — Progress Notes (Signed)
Antonio [name].  YSA:[id number] DOB: 1956/04/05 DOA: 11/28/2018 PCP: Imagene Riches, NP    Brief Narrative:  63yo male with influenza A causing respiratory failure and cardiogenic shock. Prolonged mechanical ventilation, tracheostomy placed on January 16.  Significant Events:  1/6 self extubated  1/7 re-intubated  1/8 self extubated 1/9 acute respiratory failure, mucus plugging right lower lobe, re-intubated, bronchoscopy 1/10 New fever T Max 103.1, antibiotics resumed with ceftaz and vancomycin 1/11 Hemodynamically seems to be improving. Very hypernatremic. X-ray improving. Adding free water, one-time Lasix, follow-up chemistry a.m. Hoping to initiate weaning efforts on 1/12 1/16 Continuing diuresis and ongoing ventilatory support. Working on weaning but still has significant work of breathing. Plan has been to proceed with tracheostomy. Hopefully we can continue ongoing diuresis and continue to decrease sedation 1/20 still struggling to wean. Secretions and anxiety are barriers.  1/21 trach revision to 6 distal XLT, fever, increased secretions, abx broadened / bronch 1/22 ongoing thick secretions 1/24 Failed SBT, low grade fever, Per CXR persistent pleural effusions / basilar consolidation atelectasis vs pneumonia per bases 1/25 Mucus plugged with slow recovery after chest PT and Wound Care 2/3  first time weaned for greater than 15 minutes (previously failed due to agitation, secretions, tachypnea) 2/10 TRH assumed care 2/11 trach changed to a #5 cuffless per PCCM  2/13 trach changed to #4 shiley cuffless  2/13 afternoon - pulled out new trach and "disoldged" PEG - both reinserted/repositioned  2/13 PM > 2/14 AM acute hypoxic failure - emergent bedside bronch > trach obstructed - #6 cuffed trach placed > back to ICU on vent  2/14 trach changed to #5 cuffed 2/15 TRH resumed care - resp distress > back on vent  2/15  Failed ATC with  increased WOB 2/16  Failed ATC with increased WOB  2/21 back on pressure support 2/24 back on TC  01/27/19 - 5  XLT cuffless trach    Subjective:  Patient in bed, appears comfortable, denies any headache, no fever, no chest pain or pressure, no shortness of breath , no abdominal pain. No focal weakness.   Assessment & Plan:  Acute hypoxic respiratory failure - Influenza A / Corynebacterium pneumonia -was under the care of ICU, required multiple intubations thereafter tracheostomy, much improved after he received a treatment of Mucomyst on 01/26/2019 followed by suctioning, also activity has been increased he has been encouraged to sit up in chair, repeat Mucomyst treatment on 01/27/2019.  He is about to finish his IV ceftaz course with a stop date of 01/28/2019.  Clinically respiratory failure much improved.  Defer trach management to pulmonary, of note his trach was changed to 5 XLT cuffless trach on 01/27/2019 by pulmonary.   Acute systolic heart failure  -  no plans for cardiac cath per Cardiology - EF 20-25% via TTE 11/29/18, but improved to 60-65% on f/u 1/29 - likely due to sepsis - cont BB and ARB - monitor weight closely and monitor clinically closely.  Currently no need for diuretics.  Filed Weights   01/27/19 0712 01/29/19 0550 01/30/19 0500  Weight: 90.9 kg 90.7 kg 90.4 kg    Dysphagia.  Due to tracheostomy.  Currently on PEG tube feeds, speech therapy following due for barium swallow today, advance diet as tolerated per speech.  Currently has a cuffless trach.  Acute metabolic encephalopathy - resolved.  Deconditioning -stable will require placement.     Sacral decub - Per wound care - General  Surgery consulted on 2/21> no need for surgical debridement.  DM2 - ISS  CBG (last 3)  Recent Labs    01/30/19 0023 01/30/19 0420 01/30/19 0742  GLUCAP 118* 135* 186*   Lab Results  Component Value Date   HGBA1C 7.3 (H) 01/21/2019     DVT prophylaxis: lovenox  Code Status: FULL  CODE Family Communication: no family present at time of exam  Disposition Plan: stable for tele bed - cont trach care - complete abx tx - ready for d/c to SNF for rehab stay w/ ultimate plan for d/c home   Consultants:  PCCM Cardiology   Antimicrobials:  Ceftaz 2/20 >  Objective: Blood pressure 120/62, pulse 79, temperature 98.5 F (36.9 C), temperature source Oral, resp. rate 18, height 6' (1.829 m), weight 90.4 kg, SpO2 96 %.  Intake/Output Summary (Last 24 hours) at 01/30/2019 0940 Last data filed at 01/30/2019 0753 Gross per 24 hour  Intake 1206.25 ml  Output 1250 ml  Net -43.75 ml   Filed Weights   01/27/19 0712 01/29/19 0550 01/30/19 0500  Weight: 90.9 kg 90.7 kg 90.4 kg    Examination:  Awake Alert, Oriented X 3, No new F.N deficits, Normal affect [place].AT,PERRAL Supple Neck,No JVD, No cervical lymphadenopathy appriciated.  Symmetrical Chest wall movement, Good air movement bilaterally, CTAB RRR,No Gallops, Rubs or new Murmurs, No Parasternal Heave +ve B.Sounds, Abd Soft, No tenderness, No organomegaly appriciated, No rebound - guarding or rigidity. No Cyanosis, Clubbing or edema, Trach site appears clean with trach collar in place, PEG tube in place site clean, left BKA, right first toe ray amputation   CBC: Recent Labs  Lab 01/24/19 0324 01/27/19 0458  WBC 5.1 5.7  HGB 9.2* 10.3*  HCT 30.6* 32.8*  MCV 88.7 87.9  PLT 199 937   Basic Metabolic Panel: Recent Labs  Lab 01/24/19 0324 01/27/19 0458  NA 136 136  K 4.1 4.3  CL 98 96*  CO2 32 33*  GLUCOSE 166* 177*  BUN 20 25*  CREATININE 0.43* 0.55*  CALCIUM 8.8* 9.0  MG 1.8 1.9  PHOS 3.7 4.3   GFR: Estimated Creatinine Clearance: 107.8 mL/min (A) (by C-G formula based on SCr of 0.55 mg/dL (L)).  Liver Function Tests: Recent Labs  Lab 01/27/19 0458  AST 14*  ALT 18  ALKPHOS 57  BILITOT 0.4  PROT 6.6  ALBUMIN 2.6*     HbA1C: Hgb A1c MFr Bld  Date/Time Value Ref Range Status  01/21/2019  04:01 AM 7.3 (H) 4.8 - 5.6 % Final    Comment:    (NOTE) Pre diabetes:          5.7%-6.4% Diabetes:              >6.4% Glycemic control for   <7.0% adults with diabetes   03/25/2017 05:59 AM 12.3 (H) 4.8 - 5.6 % Final    Comment:    (NOTE)         Pre-diabetes: 5.7 - 6.4         Diabetes: >6.4         Glycemic control for adults with diabetes: <7.0     CBG: Recent Labs  Lab 01/29/19 1634 01/29/19 2025 01/30/19 0023 01/30/19 0420 01/30/19 0742  GLUCAP 133* 152* 118* 135* 186*     Scheduled Meds: . arformoterol  15 mcg Nebulization BID  . aspirin  81 mg Per Tube Daily  . atorvastatin  40 mg Per Tube q1800  . budesonide (PULMICORT) nebulizer solution  0.5 mg Nebulization BID  . carvedilol  3.125 mg Per Tube BID WC  . chlorhexidine gluconate (MEDLINE KIT)  15 mL Mouth Rinse BID  . collagenase   Topical Daily  . enoxaparin (LOVENOX) injection  40 mg Subcutaneous Q24H  . famotidine  20 mg Per Tube BID  . folic acid  1 mg Per Tube Daily  . free water  100 mL Per Tube Q8H  . guaiFENesin  5 mL Per Tube Q12H  . insulin aspart  0-20 Units Subcutaneous Q4H  . insulin detemir  35 Units Subcutaneous BID  . ipratropium-albuterol  3 mL Nebulization TID  . mouth rinse  15 mL Mouth Rinse QID  . multivitamin  15 mL Per Tube Daily  . nutrition supplement (JUVEN)  1 packet Per Tube BID BM  . tapentadol  50 mg Oral QID  . thiamine  100 mg Per Tube Daily     LOS: 63 days   Signature  Lala Lund M.D on 01/30/2019 at 9:40 AM   -  To page go to [email or web address]

## 2019-01-30 NOTE — Progress Notes (Signed)
Modified Barium Swallow Progress Note  Patient Details  Name: Antonio [name]. MRN: [id number] Date of Birth: 09-20-1956  Today's Date: 01/30/2019  Modified Barium Swallow completed.  Full report located under Chart Review in the Imaging Section.  Brief recommendations include the following:  Clinical Impression  Pt's trach was capped this morning and during MBS with normal oropharyngeal pressures. Swallow function has improved from prior study despite silent aspiration with thin and penetration of nectar and honey. Laryngeal excursion, pharyngeal contraction and epiglottic inversion are not of normal strength and ROM however improved from last week. Incomplete closure led to penetration with thin, nectar and only flash with honey thick x 1. Chin tuck did not prevent penetration (silent) with nectar and vallecular and pyriform sinus residue present and was mildly increased with honey thick. Verbal cues to perform second swallow reduced volume. Puree was not penetrated this study. Recommend Dys 2 given mild oral residue, impulsivity and deconditioning. Honey thick liquids recommended with two swallows, intermittent throat clears and pills whole in applesauce. ST will continue to follow for safety and upgrade of po textures.   Swallow Evaluation Recommendations       SLP Diet Recommendations: Honey thick liquids;Dysphagia 2 (Fine chop) solids   Liquid Administration via: Cup;No straw   Medication Administration: Whole meds with puree       Compensations: Slow rate;Small sips/bites;Multiple dry swallows after each bite/sip;Clear throat intermittently   Postural Changes: Seated upright at 90 degrees   Oral Care Recommendations: Oral care BID        Royce Macadamia 01/30/2019,2:53 PM   Breck Coons [place].Ed Nurse, children's [phone] Office [phone]

## 2019-01-30 NOTE — Plan of Care (Signed)
  Problem: Clinical Measurements: Goal: Ability to maintain clinical measurements within normal limits will improve Outcome: Progressing Goal: Diagnostic test results will improve Outcome: Progressing Goal: Respiratory complications will improve Outcome: Progressing Goal: Cardiovascular complication will be avoided Outcome: Progressing   

## 2019-01-30 NOTE — Progress Notes (Signed)
RT came to check on patient and found trach capped. Patient looks great at this time. No respiratory distress. All vitals stable and O2 saturation is sufficient. RT will continue to monitor.

## 2019-01-30 NOTE — Progress Notes (Signed)
In with trach patient this am. Possible capping of trach today. Will need order to continue. RT will continue to monitor.

## 2019-01-30 NOTE — Progress Notes (Signed)
Pt at bedside to do routine ATC Check. Pt Spo2 is 87%.  Pt placed on 2:L [place], SpO2 increased to 94%.  Pt instructed to leave [place] on throughout the night.  Will continue to monitor.

## 2019-01-31 LAB — GLUCOSE, CAPILLARY
GLUCOSE-CAPILLARY: 104 mg/dL — AB (ref 70–99)
Glucose-Capillary: 114 mg/dL — ABNORMAL HIGH (ref 70–99)
Glucose-Capillary: 136 mg/dL — ABNORMAL HIGH (ref 70–99)
Glucose-Capillary: 165 mg/dL — ABNORMAL HIGH (ref 70–99)
Glucose-Capillary: 189 mg/dL — ABNORMAL HIGH (ref 70–99)
Glucose-Capillary: 205 mg/dL — ABNORMAL HIGH (ref 70–99)

## 2019-01-31 MED ORDER — INSULIN ASPART 100 UNIT/ML ~~LOC~~ SOLN
0.0000 [IU] | Freq: Three times a day (TID) | SUBCUTANEOUS | Status: DC
  Administered 2019-01-31: 4 [IU] via SUBCUTANEOUS
  Administered 2019-02-01 (×2): 3 [IU] via SUBCUTANEOUS
  Administered 2019-02-02: 7 [IU] via SUBCUTANEOUS
  Administered 2019-02-02: 3 [IU] via SUBCUTANEOUS
  Administered 2019-02-03: 4 [IU] via SUBCUTANEOUS
  Administered 2019-02-03: 3 [IU] via SUBCUTANEOUS
  Administered 2019-02-03 – 2019-02-04 (×4): 4 [IU] via SUBCUTANEOUS
  Administered 2019-02-05: 7 [IU] via SUBCUTANEOUS
  Administered 2019-02-05: 3 [IU] via SUBCUTANEOUS
  Administered 2019-02-05: 7 [IU] via SUBCUTANEOUS
  Administered 2019-02-06: 3 [IU] via SUBCUTANEOUS
  Administered 2019-02-06 (×2): 4 [IU] via SUBCUTANEOUS
  Administered 2019-02-07: 3 [IU] via SUBCUTANEOUS
  Administered 2019-02-07: 4 [IU] via SUBCUTANEOUS
  Administered 2019-02-07 – 2019-02-09 (×4): 3 [IU] via SUBCUTANEOUS
  Administered 2019-02-10 – 2019-02-11 (×2): 4 [IU] via SUBCUTANEOUS
  Administered 2019-02-11 (×2): 3 [IU] via SUBCUTANEOUS
  Administered 2019-02-12 (×2): 4 [IU] via SUBCUTANEOUS
  Administered 2019-02-13 (×2): 3 [IU] via SUBCUTANEOUS
  Administered 2019-02-14 (×2): 4 [IU] via SUBCUTANEOUS
  Administered 2019-02-15: 3 [IU] via SUBCUTANEOUS
  Administered 2019-02-15: 4 [IU] via SUBCUTANEOUS
  Administered 2019-02-16: 3 [IU] via SUBCUTANEOUS
  Administered 2019-02-16: 4 [IU] via SUBCUTANEOUS
  Administered 2019-02-16 – 2019-02-17 (×2): 3 [IU] via SUBCUTANEOUS
  Administered 2019-02-17: 4 [IU] via SUBCUTANEOUS
  Administered 2019-02-20: 3 [IU] via SUBCUTANEOUS

## 2019-01-31 MED ORDER — PRO-STAT SUGAR FREE PO LIQD
30.0000 mL | Freq: Three times a day (TID) | ORAL | Status: DC
  Administered 2019-01-31 – 2019-02-01 (×6): 30 mL via ORAL
  Filled 2019-01-31 (×8): qty 30

## 2019-01-31 MED ORDER — INSULIN DETEMIR 100 UNIT/ML ~~LOC~~ SOLN
25.0000 [IU] | Freq: Two times a day (BID) | SUBCUTANEOUS | Status: DC
  Administered 2019-01-31 – 2019-02-18 (×36): 25 [IU] via SUBCUTANEOUS
  Filled 2019-01-31 (×39): qty 0.25

## 2019-01-31 NOTE — Progress Notes (Signed)
[place] [facility] [place].  YSA:[id number] DOB: 1956/04/05 DOA: 11/28/2018 PCP: Imagene Riches, NP    Brief Narrative:  63yo male with influenza A causing respiratory failure and cardiogenic shock. Prolonged mechanical ventilation, tracheostomy placed on January 16.  Significant Events:  1/6 self extubated  1/7 re-intubated  1/8 self extubated 1/9 acute respiratory failure, mucus plugging right lower lobe, re-intubated, bronchoscopy 1/10 New fever T Max 103.1, antibiotics resumed with ceftaz and vancomycin 1/11 Hemodynamically seems to be improving. Very hypernatremic. X-ray improving. Adding free water, one-time Lasix, follow-up chemistry a.m. Hoping to initiate weaning efforts on 1/12 1/16 Continuing diuresis and ongoing ventilatory support. Working on weaning but still has significant work of breathing. Plan has been to proceed with tracheostomy. Hopefully we can continue ongoing diuresis and continue to decrease sedation 1/20 still struggling to wean. Secretions and anxiety are barriers.  1/21 trach revision to 6 distal XLT, fever, increased secretions, abx broadened / bronch 1/22 ongoing thick secretions 1/24 Failed SBT, low grade fever, Per CXR persistent pleural effusions / basilar consolidation atelectasis vs pneumonia per bases 1/25 Mucus plugged with slow recovery after chest PT and Wound Care 2/3  first time weaned for greater than 15 minutes (previously failed due to agitation, secretions, tachypnea) 2/10 TRH assumed care 2/11 trach changed to a #5 cuffless per PCCM  2/13 trach changed to #4 shiley cuffless  2/13 afternoon - pulled out new trach and "disoldged" PEG - both reinserted/repositioned  2/13 PM > 2/14 AM acute hypoxic failure - emergent bedside bronch > trach obstructed - #6 cuffed trach placed > back to ICU on vent  2/14 trach changed to #5 cuffed 2/15 TRH resumed care - resp distress > back on vent  2/15  Failed ATC with  increased WOB 2/16  Failed ATC with increased WOB  2/21 back on pressure support 2/24 back on TC  01/27/19 - 5  XLT cuffless trach    Subjective:  Patient in bed, appears comfortable, denies any headache, no fever, no chest pain or pressure, no shortness of breath , no abdominal pain. No focal weakness.   Assessment & Plan:  Acute hypoxic respiratory failure - Influenza A / Corynebacterium pneumonia -was under the care of ICU, required multiple intubations thereafter tracheostomy, much improved after he received a treatment of Mucomyst on 01/26/2019 followed by suctioning, also activity has been increased he has been encouraged to sit up in chair, repeat Mucomyst treatment on 01/27/2019.  He is about to finish his IV ceftaz course with a stop date of 01/28/2019.  Clinically respiratory failure much improved.  Defer trach management to pulmonary, of note his trach was changed to 5 XLT cuffless trach on 01/27/2019 by pulmonary.   Acute systolic heart failure  -  no plans for cardiac cath per Cardiology - EF 20-25% via TTE 11/29/18, but improved to 60-65% on f/u 1/29 - likely due to sepsis - cont BB and ARB - monitor weight closely and monitor clinically closely.  Currently no need for diuretics.  Filed Weights   01/27/19 0712 01/29/19 0550 01/30/19 0500  Weight: 90.9 kg 90.7 kg 90.4 kg    Dysphagia.  Due to tracheostomy.  Currently on PEG tube feeds, speech therapy following due for barium swallow today, advance diet as tolerated per speech.  Currently has a cuffless trach.  Acute metabolic encephalopathy - resolved.  Deconditioning -stable will require placement.     Sacral decub - Per wound care - General  Surgery consulted on 2/21> no need for surgical debridement.  DM2 - ISS  CBG (last 3)  Recent Labs    01/31/19 0052 01/31/19 0417 01/31/19 0810  GLUCAP 205* 136* 165*   Lab Results  Component Value Date   HGBA1C 7.3 (H) 01/21/2019     DVT prophylaxis: lovenox  Code Status: FULL  CODE Family Communication: no family present at time of exam  Disposition Plan: stable for tele bed - cont trach care - complete abx tx - ready for d/c to SNF for rehab stay w/ ultimate plan for d/c home   Consultants:  PCCM Cardiology   Antimicrobials:  Ceftaz 2/20 >  Objective: Blood pressure (!) 148/69, pulse 70, temperature 97.7 F (36.5 C), resp. rate 18, height 6' (1.829 m), weight 90.4 kg, SpO2 93 %.  Intake/Output Summary (Last 24 hours) at 01/31/2019 1003 Last data filed at 01/30/2019 2223 Gross per 24 hour  Intake 30 ml  Output 400 ml  Net -370 ml   Filed Weights   01/27/19 0712 01/29/19 0550 01/30/19 0500  Weight: 90.9 kg 90.7 kg 90.4 kg    Examination:  Awake Alert, Oriented X 3, No new F.N deficits, Normal affect [place].AT,PERRAL Supple Neck,No JVD, No cervical lymphadenopathy appriciated.  Symmetrical Chest wall movement, Good air movement bilaterally, CTAB RRR,No Gallops, Rubs or new Murmurs, No Parasternal Heave +ve B.Sounds, Abd Soft, No tenderness, No organomegaly appriciated, No rebound - guarding or rigidity. No Cyanosis, Clubbing or edema, Trach site appears clean with trach collar in place, PEG tube in place site clean, left BKA, right first toe ray amputation   CBC: Recent Labs  Lab 01/27/19 0458  WBC 5.7  HGB 10.3*  HCT 32.8*  MCV 87.9  PLT 810   Basic Metabolic Panel: Recent Labs  Lab 01/27/19 0458  NA 136  K 4.3  CL 96*  CO2 33*  GLUCOSE 177*  BUN 25*  CREATININE 0.55*  CALCIUM 9.0  MG 1.9  PHOS 4.3   GFR: Estimated Creatinine Clearance: 107.8 mL/min (A) (by C-G formula based on SCr of 0.55 mg/dL (L)).  Liver Function Tests: Recent Labs  Lab 01/27/19 0458  AST 14*  ALT 18  ALKPHOS 57  BILITOT 0.4  PROT 6.6  ALBUMIN 2.6*     HbA1C: Hgb A1c MFr Bld  Date/Time Value Ref Range Status  01/21/2019 04:01 AM 7.3 (H) 4.8 - 5.6 % Final    Comment:    (NOTE) Pre diabetes:          5.7%-6.4% Diabetes:               >6.4% Glycemic control for   <7.0% adults with diabetes   03/25/2017 05:59 AM 12.3 (H) 4.8 - 5.6 % Final    Comment:    (NOTE)         Pre-diabetes: 5.7 - 6.4         Diabetes: >6.4         Glycemic control for adults with diabetes: <7.0     CBG: Recent Labs  Lab 01/30/19 1648 01/30/19 2008 01/31/19 0052 01/31/19 0417 01/31/19 0810  GLUCAP 111* 172* 205* 136* 165*     Scheduled Meds: . arformoterol  15 mcg Nebulization BID  . aspirin  81 mg Per Tube Daily  . atorvastatin  40 mg Per Tube q1800  . budesonide (PULMICORT) nebulizer solution  0.5 mg Nebulization BID  . carvedilol  3.125 mg Per Tube BID WC  . chlorhexidine gluconate (MEDLINE KIT)  15 mL Mouth Rinse BID  . collagenase   Topical Daily  . enoxaparin (LOVENOX) injection  40 mg Subcutaneous Q24H  . famotidine  20 mg Per Tube BID  . feeding supplement (PRO-STAT SUGAR FREE 64)  30 mL Oral TID WC  . folic acid  1 mg Per Tube Daily  . guaiFENesin  5 mL Per Tube Q12H  . insulin aspart  0-20 Units Subcutaneous TID WC  . insulin detemir  25 Units Subcutaneous BID  . mouth rinse  15 mL Mouth Rinse QID  . multivitamin  15 mL Per Tube Daily  . nutrition supplement (JUVEN)  1 packet Per Tube BID BM  . tapentadol  50 mg Oral QID  . thiamine  100 mg Per Tube Daily     LOS: 64 days   Signature  Lala Lund M.D on 01/31/2019 at 10:03 AM   -  To page go to [email or web address]         [place] TE[facility] - [place].  HDQ:[id number] DOB: Nov 30, 1955 DOA: 11/28/2018 PCP: Imagene Riches, NP    Brief Narrative:  63yo male with influenza A causing respiratory failure and cardiogenic shock. Prolonged mechanical ventilation, tracheostomy placed on January 16.  Significant Events:  1/6 self extubated  1/7 re-intubated  1/8 self extubated 1/9 acute respiratory failure, mucus plugging right lower lobe, re-intubated, bronchoscopy 1/10 New fever T Max 103.1, antibiotics resumed with ceftaz and  vancomycin 1/11 Hemodynamically seems to be improving. Very hypernatremic. X-ray improving. Adding free water, one-time Lasix, follow-up chemistry a.m. Hoping to initiate weaning efforts on 1/12 1/16 Continuing diuresis and ongoing ventilatory support. Working on weaning but still has significant work of breathing. Plan has been to proceed with tracheostomy. Hopefully we can continue ongoing diuresis and continue to decrease sedation 1/20 still struggling to wean. Secretions and anxiety are barriers.  1/21 trach revision to 6 distal XLT, fever, increased secretions, abx broadened / bronch 1/22 ongoing thick secretions 1/24 Failed SBT, low grade fever, Per CXR persistent pleural effusions / basilar consolidation atelectasis vs pneumonia per bases 1/25 Mucus plugged with slow recovery after chest PT and Wound Care 2/3  first time weaned for greater than 15 minutes (previously failed due to agitation, secretions, tachypnea) 2/10 TRH assumed care 2/11 trach changed to a #5 cuffless per PCCM  2/13 trach changed to #4 shiley cuffless  2/13 afternoon - pulled out new trach and "disoldged" PEG - both reinserted/repositioned  2/13 PM > 2/14 AM acute hypoxic failure - emergent bedside bronch > trach obstructed - #6 cuffed trach placed > back to ICU on vent  2/14 trach changed to #5 cuffed 2/15 TRH resumed care - resp distress > back on vent  2/15  Failed ATC with increased WOB 2/16  Failed ATC with increased WOB  2/21 back on pressure support 2/24 back on TC  01/27/19 - 5  XLT cuffless trach  3/5/  - trach change to cuff less #4, trach capped on 01/30/2019   Subjective:  Patient in bed, appears comfortable, denies any headache, no fever, no chest pain or pressure, no shortness of breath , no abdominal pain. No focal weakness.   Assessment & Plan:  Acute hypoxic respiratory failure - Influenza A / Corynebacterium pneumonia -was under the care of ICU, required multiple intubations thereafter  tracheostomy, much improved after he received a treatment of Mucomyst on 01/26/2019 followed by suctioning, also activity has been increased he has been encouraged to sit  up in chair, repeat Mucomyst treatment on 01/27/2019.  He is about to finish his IV ceftaz course with a stop date of 01/28/2019.  Clinically respiratory failure much improved.  Defer trach management to pulmonary, his trach has been changed to cuffless #4 on 01/29/2019 thereafter has been capped by pulmonary on 01/30/2019, patient has tolerated this procedure well.   Acute systolic heart failure  -  no plans for cardiac cath per Cardiology - EF 20-25% via TTE 11/29/18, but improved to 60-65% on f/u 1/29 - likely due to sepsis - cont BB and ARB - monitor weight closely and monitor clinically closely.  Currently no need for diuretics.  Filed Weights   01/27/19 0712 01/29/19 0550 01/30/19 0500  Weight: 90.9 kg 90.7 kg 90.4 kg    Dysphagia.  Due to tracheostomy.  He was on PEG tube feeds with speech therapy following him, transition to dysphagia 2 diet on 01/31/2019 by speech therapy, PEG tube feeds have been held will monitor closely.  Acute metabolic encephalopathy - resolved.  Deconditioning -stable will require placement.     Sacral decub - Per wound care - General Surgery consulted on 2/21> no need for surgical debridement.  DM2 - ISS -  currently on Lantus and sliding scale, since PEG tube feeds have been stopped and he has been placed on a dysphagia 2 diet will cut down high-dose Lantus slightly and monitor cautiously.  CBG (last 3)  Recent Labs    01/31/19 0052 01/31/19 0417 01/31/19 0810  GLUCAP 205* 136* 165*   Lab Results  Component Value Date   HGBA1C 7.3 (H) 01/21/2019     DVT prophylaxis: lovenox  Code Status: FULL CODE Family Communication: no family present at time of exam  Disposition Plan: stable for tele bed - cont trach care - complete abx tx - ready for d/c to SNF for rehab stay w/ ultimate plan for d/c  home   Consultants:  PCCM Cardiology   Antimicrobials:  Ceftaz 2/20 >  Objective: Blood pressure (!) 148/69, pulse 70, temperature 97.7 F (36.5 C), resp. rate 18, height 6' (1.829 m), weight 90.4 kg, SpO2 93 %.  Intake/Output Summary (Last 24 hours) at 01/31/2019 1004 Last data filed at 01/30/2019 2223 Gross per 24 hour  Intake 30 ml  Output 400 ml  Net -370 ml   Filed Weights   01/27/19 0712 01/29/19 0550 01/30/19 0500  Weight: 90.9 kg 90.7 kg 90.4 kg    Examination:  Awake Alert, Oriented X 3, No new F.N deficits, Normal affect [place].AT,PERRAL Supple Neck,No JVD, No cervical lymphadenopathy appriciated.  Symmetrical Chest wall movement, Good air movement bilaterally, CTAB RRR,No Gallops, Rubs or new Murmurs, No Parasternal Heave +ve B.Sounds, Abd Soft, No tenderness, No organomegaly appriciated, No rebound - guarding or rigidity. No Cyanosis, Clubbing or edema, No new Rash or bruise Trach site appears clean Trach now capped, PEG tube in place site clean, left BKA, right first toe ray amputation   CBC: Recent Labs  Lab 01/27/19 0458  WBC 5.7  HGB 10.3*  HCT 32.8*  MCV 87.9  PLT 151   Basic Metabolic Panel: Recent Labs  Lab 01/27/19 0458  NA 136  K 4.3  CL 96*  CO2 33*  GLUCOSE 177*  BUN 25*  CREATININE 0.55*  CALCIUM 9.0  MG 1.9  PHOS 4.3   GFR: Estimated Creatinine Clearance: 107.8 mL/min (A) (by C-G formula based on SCr of 0.55 mg/dL (L)).  Liver Function Tests: Recent Labs  Lab 01/27/19  0458  AST 14*  ALT 18  ALKPHOS 57  BILITOT 0.4  PROT 6.6  ALBUMIN 2.6*     HbA1C: Hgb A1c MFr Bld  Date/Time Value Ref Range Status  01/21/2019 04:01 AM 7.3 (H) 4.8 - 5.6 % Final    Comment:    (NOTE) Pre diabetes:          5.7%-6.4% Diabetes:              >6.4% Glycemic control for   <7.0% adults with diabetes   03/25/2017 05:59 AM 12.3 (H) 4.8 - 5.6 % Final    Comment:    (NOTE)         Pre-diabetes: 5.7 - 6.4         Diabetes: >6.4          Glycemic control for adults with diabetes: <7.0     CBG: Recent Labs  Lab 01/30/19 1648 01/30/19 2008 01/31/19 0052 01/31/19 0417 01/31/19 0810  GLUCAP 111* 172* 205* 136* 165*     Scheduled Meds: . arformoterol  15 mcg Nebulization BID  . aspirin  81 mg Per Tube Daily  . atorvastatin  40 mg Per Tube q1800  . budesonide (PULMICORT) nebulizer solution  0.5 mg Nebulization BID  . carvedilol  3.125 mg Per Tube BID WC  . chlorhexidine gluconate (MEDLINE KIT)  15 mL Mouth Rinse BID  . collagenase   Topical Daily  . enoxaparin (LOVENOX) injection  40 mg Subcutaneous Q24H  . famotidine  20 mg Per Tube BID  . feeding supplement (PRO-STAT SUGAR FREE 64)  30 mL Oral TID WC  . folic acid  1 mg Per Tube Daily  . guaiFENesin  5 mL Per Tube Q12H  . insulin aspart  0-20 Units Subcutaneous TID WC  . insulin detemir  25 Units Subcutaneous BID  . mouth rinse  15 mL Mouth Rinse QID  . multivitamin  15 mL Per Tube Daily  . nutrition supplement (JUVEN)  1 packet Per Tube BID BM  . tapentadol  50 mg Oral QID  . thiamine  100 mg Per Tube Daily     LOS: 64 days   Signature  Lala Lund M.D on 01/31/2019 at 10:04 AM   -  To page go to [email or web address]

## 2019-01-31 NOTE — Progress Notes (Signed)
Patient alert and oriented x 4, able to verbalized needs.  Trach remains capped throughout shift, no inner cannula suctioning required.  Patient has productive cough with scant frothy secretions.  Remains on continuous feeding via peg with no distress.  Stable condition at end of shift, will continue to monitor.

## 2019-02-01 LAB — CBC
HCT: 37.3 % — ABNORMAL LOW (ref 39.0–52.0)
Hemoglobin: 11.7 g/dL — ABNORMAL LOW (ref 13.0–17.0)
MCH: 27.3 pg (ref 26.0–34.0)
MCHC: 31.4 g/dL (ref 30.0–36.0)
MCV: 87.1 fL (ref 80.0–100.0)
Platelets: 165 10*3/uL (ref 150–400)
RBC: 4.28 MIL/uL (ref 4.22–5.81)
RDW: 16.9 % — ABNORMAL HIGH (ref 11.5–15.5)
WBC: 7.8 10*3/uL (ref 4.0–10.5)
nRBC: 0 % (ref 0.0–0.2)

## 2019-02-01 LAB — BASIC METABOLIC PANEL
Anion gap: 11 (ref 5–15)
BUN: 21 mg/dL — ABNORMAL HIGH (ref 6–20)
CO2: 29 mmol/L (ref 22–32)
Calcium: 9.5 mg/dL (ref 8.9–10.3)
Chloride: 96 mmol/L — ABNORMAL LOW (ref 98–111)
Creatinine, Ser: 0.73 mg/dL (ref 0.61–1.24)
GFR calc Af Amer: >60 mL/min (ref >60)
GFR calc non Af Amer: >60 mL/min (ref >60)
GLUCOSE: 97 mg/dL (ref 70–99)
POTASSIUM: 4 mmol/L (ref 3.5–5.1)
Sodium: 136 mmol/L (ref 135–145)

## 2019-02-01 LAB — GLUCOSE, CAPILLARY
GLUCOSE-CAPILLARY: 134 mg/dL — AB (ref 70–99)
Glucose-Capillary: 87 mg/dL (ref 70–99)
Glucose-Capillary: 128 mg/dL — ABNORMAL HIGH (ref 70–99)
Glucose-Capillary: 80 mg/dL (ref 70–99)

## 2019-02-01 NOTE — Progress Notes (Signed)
[facility] [place].  IDC:[id number] DOB: [birth year] DOA: 11/28/2018 PCP: Imagene Riches, NP     Patient is medically stable ready for discharge await bed for placement.     Brief Narrative:  63yo male with influenza A causing respiratory failure and cardiogenic shock. Prolonged mechanical ventilation, tracheostomy placed on January 16.  Significant Events:  1/6 self extubated  1/7 re-intubated  1/8 self extubated 1/9 acute respiratory failure, mucus plugging right lower lobe, re-intubated, bronchoscopy 1/10 New fever T Max 103.1, antibiotics resumed with ceftaz and vancomycin 1/11 Hemodynamically seems to be improving. Very hypernatremic. X-ray improving. Adding free water, one-time Lasix, follow-up chemistry a.m. Hoping to initiate weaning efforts on 1/12 1/16 Continuing diuresis and ongoing ventilatory support. Working on weaning but still has significant work of breathing. Plan has been to proceed with tracheostomy. Hopefully we can continue ongoing diuresis and continue to decrease sedation 1/20 still struggling to wean. Secretions and anxiety are barriers.  1/21 trach revision to 6 distal XLT, fever, increased secretions, abx broadened / bronch 1/22 ongoing thick secretions 1/24 Failed SBT, low grade fever, Per CXR persistent pleural effusions / basilar consolidation atelectasis vs pneumonia per bases 1/25 Mucus plugged with slow recovery after chest PT and Wound Care 2/3  first time weaned for greater than 15 minutes (previously failed due to agitation, secretions, tachypnea) 2/10 TRH assumed care 2/11 trach changed to a #5 cuffless per PCCM  2/13 trach changed to #4 shiley cuffless  2/13 afternoon - pulled out new trach and "disoldged" PEG - both reinserted/repositioned  2/13 PM > 2/14 AM acute hypoxic failure - emergent bedside bronch > trach obstructed - #6 cuffed trach placed > back to ICU on vent  2/14 trach changed to #5  cuffed 2/15 TRH resumed care - resp distress > back on vent  2/15  Failed ATC with increased WOB 2/16  Failed ATC with increased WOB  2/21 back on pressure support 2/24 back on TC  01/27/19 - 5  XLT cuffless trach    Subjective:  Patient in bed, appears comfortable, denies any headache, no fever, no chest pain or pressure, no shortness of breath , no abdominal pain. No focal weakness.    Assessment & Plan:  Acute hypoxic respiratory failure - Influenza A / Corynebacterium pneumonia -was under the care of ICU, required multiple intubations thereafter tracheostomy, much improved after he received a treatment of Mucomyst on 01/26/2019 followed by suctioning, also activity has been increased he has been encouraged to sit up in chair, repeat Mucomyst treatment on 01/27/2019.  He is about to finish his IV ceftaz course with a stop date of 01/28/2019.  Clinically respiratory failure much improved.  Defer trach management to pulmonary, of note his trach was changed to 5 XLT cuffless trach on 01/27/2019 by pulmonary.   Acute systolic heart failure  -  no plans for cardiac cath per Cardiology - EF 20-25% via TTE 11/29/18, but improved to 60-65% on f/u 1/29 - likely due to sepsis - cont BB and ARB - monitor weight closely and monitor clinically closely.  Currently no need for diuretics.  Filed Weights   01/29/19 0550 01/30/19 0500 02/01/19 0648  Weight: 90.7 kg 90.4 kg 90.8 kg    Dysphagia.  Due to tracheostomy.  Currently on PEG tube feeds, speech therapy following due for barium swallow today, advance diet as tolerated per speech.  Currently has a cuffless trach.  Acute metabolic encephalopathy - resolved.  Deconditioning -stable will require placement.     Sacral decub - Per wound care - General Surgery consulted on 2/21> no need for surgical debridement.  DM2 - ISS  CBG (last 3)  Recent Labs    01/31/19 1651 01/31/19 2233 02/01/19 0819  GLUCAP 114* 104* 87   Lab Results  Component Value  Date   HGBA1C 7.3 (H) 01/21/2019     DVT prophylaxis: lovenox  Code Status: FULL CODE Family Communication: no family present at time of exam  Disposition Plan: stable for tele bed - cont trach care - complete abx tx - ready for d/c to SNF for rehab stay w/ ultimate plan for d/c home   Consultants:  PCCM Cardiology   Antimicrobials:  Ceftaz 2/20 >  Objective: Blood pressure (!) 142/69, pulse 76, temperature 98.3 F (36.8 C), resp. rate 18, height 6' (1.829 m), weight 90.8 kg, SpO2 (!) 89 %.  Intake/Output Summary (Last 24 hours) at 02/01/2019 0907 Last data filed at 02/01/2019 6295 Gross per 24 hour  Intake -  Output 1451 ml  Net -1451 ml   Filed Weights   01/29/19 0550 01/30/19 0500 02/01/19 0648  Weight: 90.7 kg 90.4 kg 90.8 kg    Examination:  Awake Alert, Oriented X 3, No new F.N deficits, Normal affect [place].AT,PERRAL Supple Neck,No JVD, No cervical lymphadenopathy appriciated.  Symmetrical Chest wall movement, Good air movement bilaterally, CTAB RRR,No Gallops, Rubs or new Murmurs, No Parasternal Heave +ve B.Sounds, Abd Soft, No tenderness, No organomegaly appriciated, No rebound - guarding or rigidity. No Cyanosis, Clubbing or edema, Trach site appears clean with trach collar in place, PEG tube in place site clean, left BKA, right first toe ray amputation   CBC: Recent Labs  Lab 01/27/19 0458  WBC 5.7  HGB 10.3*  HCT 32.8*  MCV 87.9  PLT 284   Basic Metabolic Panel: Recent Labs  Lab 01/27/19 0458  NA 136  K 4.3  CL 96*  CO2 33*  GLUCOSE 177*  BUN 25*  CREATININE 0.55*  CALCIUM 9.0  MG 1.9  PHOS 4.3   GFR: Estimated Creatinine Clearance: 107.8 mL/min (A) (by C-G formula based on SCr of 0.55 mg/dL (L)).  Liver Function Tests: Recent Labs  Lab 01/27/19 0458  AST 14*  ALT 18  ALKPHOS 57  BILITOT 0.4  PROT 6.6  ALBUMIN 2.6*     HbA1C: Hgb A1c MFr Bld  Date/Time Value Ref Range Status  01/21/2019 04:01 AM 7.3 (H) 4.8 - 5.6 % Final     Comment:    (NOTE) Pre diabetes:          5.7%-6.4% Diabetes:              >6.4% Glycemic control for   <7.0% adults with diabetes   03/25/2017 05:59 AM 12.3 (H) 4.8 - 5.6 % Final    Comment:    (NOTE)         Pre-diabetes: 5.7 - 6.4         Diabetes: >6.4         Glycemic control for adults with diabetes: <7.0     CBG: Recent Labs  Lab 01/31/19 0810 01/31/19 1235 01/31/19 1651 01/31/19 2233 02/01/19 0819  GLUCAP 165* 189* 114* 104* 87     Scheduled Meds: . arformoterol  15 mcg Nebulization BID  . aspirin  81 mg Per Tube Daily  . atorvastatin  40 mg Per Tube q1800  . budesonide (PULMICORT) nebulizer solution  0.5 mg Nebulization BID  .  carvedilol  3.125 mg Per Tube BID WC  . chlorhexidine gluconate (MEDLINE KIT)  15 mL Mouth Rinse BID  . collagenase   Topical Daily  . enoxaparin (LOVENOX) injection  40 mg Subcutaneous Q24H  . famotidine  20 mg Per Tube BID  . feeding supplement (PRO-STAT SUGAR FREE 64)  30 mL Oral TID WC  . folic acid  1 mg Per Tube Daily  . guaiFENesin  5 mL Per Tube Q12H  . insulin aspart  0-20 Units Subcutaneous TID WC  . insulin detemir  25 Units Subcutaneous BID  . mouth rinse  15 mL Mouth Rinse QID  . multivitamin  15 mL Per Tube Daily  . nutrition supplement (JUVEN)  1 packet Per Tube BID BM  . tapentadol  50 mg Oral QID  . thiamine  100 mg Per Tube Daily     LOS: 65 days   Signature  Lala Lund M.D on 02/01/2019 at 9:07 AM   -  To page go to [email or web address]         [place] TE[facility] - [place].  JJO:[id number] DOB: Apr 21, 1956 DOA: 11/28/2018 PCP: Imagene Riches, NP    Brief Narrative:  64yo male with influenza A causing respiratory failure and cardiogenic shock. Prolonged mechanical ventilation, tracheostomy placed on January 16.  Significant Events:  1/6 self extubated  1/7 re-intubated  1/8 self extubated 1/9 acute respiratory failure, mucus plugging right lower lobe, re-intubated,  bronchoscopy 1/10 New fever T Max 103.1, antibiotics resumed with ceftaz and vancomycin 1/11 Hemodynamically seems to be improving. Very hypernatremic. X-ray improving. Adding free water, one-time Lasix, follow-up chemistry a.m. Hoping to initiate weaning efforts on 1/12 1/16 Continuing diuresis and ongoing ventilatory support. Working on weaning but still has significant work of breathing. Plan has been to proceed with tracheostomy. Hopefully we can continue ongoing diuresis and continue to decrease sedation 1/20 still struggling to wean. Secretions and anxiety are barriers.  1/21 trach revision to 6 distal XLT, fever, increased secretions, abx broadened / bronch 1/22 ongoing thick secretions 1/24 Failed SBT, low grade fever, Per CXR persistent pleural effusions / basilar consolidation atelectasis vs pneumonia per bases 1/25 Mucus plugged with slow recovery after chest PT and Wound Care 2/3  first time weaned for greater than 15 minutes (previously failed due to agitation, secretions, tachypnea) 2/10 TRH assumed care 2/11 trach changed to a #5 cuffless per PCCM  2/13 trach changed to #4 shiley cuffless  2/13 afternoon - pulled out new trach and "disoldged" PEG - both reinserted/repositioned  2/13 PM > 2/14 AM acute hypoxic failure - emergent bedside bronch > trach obstructed - #6 cuffed trach placed > back to ICU on vent  2/14 trach changed to #5 cuffed 2/15 TRH resumed care - resp distress > back on vent  2/15  Failed ATC with increased WOB 2/16  Failed ATC with increased WOB  2/21 back on pressure support 2/24 back on TC  01/27/19 - 5  XLT cuffless trach  3/5/  - trach change to cuff less #4, trach capped on 01/30/2019   Subjective:  Patient in bed, appears comfortable, denies any headache, no fever, no chest pain or pressure, no shortness of breath , no abdominal pain. No focal weakness.   Assessment & Plan:  Acute hypoxic respiratory failure - Influenza A / Corynebacterium  pneumonia -was under the care of ICU, required multiple intubations thereafter tracheostomy, much improved after he received a treatment of Mucomyst on  01/26/2019 followed by suctioning, also activity has been increased he has been encouraged to sit up in chair, repeat Mucomyst treatment on 01/27/2019.  He is about to finish his IV ceftaz course with a stop date of 01/28/2019.  Clinically respiratory failure much improved.  Defer trach management to pulmonary, his trach has been changed to cuffless #4 on 01/29/2019 thereafter has been capped by pulmonary on 01/30/2019, patient has tolerated this procedure well.   Acute systolic heart failure  -  no plans for cardiac cath per Cardiology - EF 20-25% via TTE 11/29/18, but improved to 60-65% on f/u 1/29 - likely due to sepsis - cont BB and ARB - monitor weight closely and monitor clinically closely.  Currently no need for diuretics.  Filed Weights   01/29/19 0550 01/30/19 0500 02/01/19 0648  Weight: 90.7 kg 90.4 kg 90.8 kg    Dysphagia.  Due to tracheostomy.  He was on PEG tube feeds with speech therapy following him, transition to dysphagia 2 diet on 01/31/2019 by speech therapy, PEG tube feeds have been held will monitor closely.  Acute metabolic encephalopathy - resolved.  Deconditioning -stable will require placement.     Sacral decub - Per wound care - General Surgery consulted on 2/21> no need for surgical debridement.  DM2 - ISS -  currently on Lantus and sliding scale, since PEG tube feeds have been stopped and he has been placed on a dysphagia 2 diet will cut down high-dose Lantus slightly and monitor cautiously.  CBG (last 3)  Recent Labs    01/31/19 1651 01/31/19 2233 02/01/19 0819  GLUCAP 114* 104* 87   Lab Results  Component Value Date   HGBA1C 7.3 (H) 01/21/2019     DVT prophylaxis: lovenox  Code Status: FULL CODE Family Communication: no family present at time of exam  Disposition Plan: stable for tele bed - cont trach care -  complete abx tx - ready for d/c to SNF for rehab stay w/ ultimate plan for d/c home   Consultants:  PCCM Cardiology   Antimicrobials:  Ceftaz 2/20 >  Objective: Blood pressure (!) 142/69, pulse 76, temperature 98.3 F (36.8 C), resp. rate 18, height 6' (1.829 m), weight 90.8 kg, SpO2 (!) 89 %.  Intake/Output Summary (Last 24 hours) at 02/01/2019 0907 Last data filed at 02/01/2019 5009 Gross per 24 hour  Intake -  Output 1451 ml  Net -1451 ml   Filed Weights   01/29/19 0550 01/30/19 0500 02/01/19 0648  Weight: 90.7 kg 90.4 kg 90.8 kg    Examination:  Awake Alert, Oriented X 3, No new F.N deficits, Normal affect [place].AT,PERRAL Supple Neck,No JVD, No cervical lymphadenopathy appriciated.  Symmetrical Chest wall movement, Good air movement bilaterally, CTAB RRR,No Gallops, Rubs or new Murmurs, No Parasternal Heave +ve B.Sounds, Abd Soft, No tenderness, No organomegaly appriciated, No rebound - guarding or rigidity. No Cyanosis, Clubbing or edema, No new Rash or bruise Trach site appears clean Trach now capped, PEG tube in place site clean, left BKA, right first toe ray amputation   CBC: Recent Labs  Lab 01/27/19 0458  WBC 5.7  HGB 10.3*  HCT 32.8*  MCV 87.9  PLT 381   Basic Metabolic Panel: Recent Labs  Lab 01/27/19 0458  NA 136  K 4.3  CL 96*  CO2 33*  GLUCOSE 177*  BUN 25*  CREATININE 0.55*  CALCIUM 9.0  MG 1.9  PHOS 4.3   GFR: Estimated Creatinine Clearance: 107.8 mL/min (A) (by C-G formula based  on SCr of 0.55 mg/dL (L)).  Liver Function Tests: Recent Labs  Lab 01/27/19 0458  AST 14*  ALT 18  ALKPHOS 57  BILITOT 0.4  PROT 6.6  ALBUMIN 2.6*     HbA1C: Hgb A1c MFr Bld  Date/Time Value Ref Range Status  01/21/2019 04:01 AM 7.3 (H) 4.8 - 5.6 % Final    Comment:    (NOTE) Pre diabetes:          5.7%-6.4% Diabetes:              >6.4% Glycemic control for   <7.0% adults with diabetes   03/25/2017 05:59 AM 12.3 (H) 4.8 - 5.6 % Final     Comment:    (NOTE)         Pre-diabetes: 5.7 - 6.4         Diabetes: >6.4         Glycemic control for adults with diabetes: <7.0     CBG: Recent Labs  Lab 01/31/19 0810 01/31/19 1235 01/31/19 1651 01/31/19 2233 02/01/19 0819  GLUCAP 165* 189* 114* 104* 87     Scheduled Meds: . arformoterol  15 mcg Nebulization BID  . aspirin  81 mg Per Tube Daily  . atorvastatin  40 mg Per Tube q1800  . budesonide (PULMICORT) nebulizer solution  0.5 mg Nebulization BID  . carvedilol  3.125 mg Per Tube BID WC  . chlorhexidine gluconate (MEDLINE KIT)  15 mL Mouth Rinse BID  . collagenase   Topical Daily  . enoxaparin (LOVENOX) injection  40 mg Subcutaneous Q24H  . famotidine  20 mg Per Tube BID  . feeding supplement (PRO-STAT SUGAR FREE 64)  30 mL Oral TID WC  . folic acid  1 mg Per Tube Daily  . guaiFENesin  5 mL Per Tube Q12H  . insulin aspart  0-20 Units Subcutaneous TID WC  . insulin detemir  25 Units Subcutaneous BID  . mouth rinse  15 mL Mouth Rinse QID  . multivitamin  15 mL Per Tube Daily  . nutrition supplement (JUVEN)  1 packet Per Tube BID BM  . tapentadol  50 mg Oral QID  . thiamine  100 mg Per Tube Daily     LOS: 65 days   Signature  Lala Lund M.D on 02/01/2019 at 9:07 AM   -  To page go to [email or web address]

## 2019-02-02 LAB — GLUCOSE, CAPILLARY
Glucose-Capillary: 79 mg/dL (ref 70–99)
Glucose-Capillary: 135 mg/dL — ABNORMAL HIGH (ref 70–99)
Glucose-Capillary: 202 mg/dL — ABNORMAL HIGH (ref 70–99)
Glucose-Capillary: 106 mg/dL — ABNORMAL HIGH (ref 70–99)

## 2019-02-02 NOTE — Progress Notes (Signed)
[facility] liaison coming to assess patient today.   Osborne Casco [place] LCSW [phone]

## 2019-02-02 NOTE — Progress Notes (Signed)
Physical Therapy Treatment Patient Details Name: Antonio [name]. MRN: [id number] DOB: 01/10/1956 Today's Date: 02/02/2019    History of Present Illness Pt is a 63 y.o. male admitted 11/28/18 with flu A and PNA; acute respiratory failure intubated 1/4. Trach placed 1/16. PEG placed 1/28. Hospital course complicared by acute MI, CHF, cardiogenic shock, and mucous plugging. Also with sacral wound. PMH includes DM, HTN, HLD, CAD, L BKA, R transmet amputation.    PT Comments    Patient seen to continue to progress mobility. Grimacing in pain due to sacral wound. Patient requiring Min A for bed mobility and transfers with min A/min guard for ambulation in room. Cueing throughout for safety, sequencing and posture. Will continue to recommend SNF at discharge. PT to continue to follow.     Follow Up Recommendations  SNF     Equipment Recommendations  Rolling walker with 5" wheels;3in1 (PT)    Recommendations for Other Services OT consult     Precautions / Restrictions Precautions Precautions: Fall Other Brace: LLE prothesis, PEG  Restrictions Weight Bearing Restrictions: No LLE Weight Bearing: Weight bearing as tolerated    Mobility  Bed Mobility Overal bed mobility: Needs Assistance Bed Mobility: Supine to Sit     Supine to sit: Min assist     General bed mobility comments: patient walking LE towards EOB; Min A for trunk control to come into upright sitting posture at EOB   Transfers Overall transfer level: Needs assistance Equipment used: Rolling walker (2 wheeled) Transfers: Sit to/from Stand Sit to Stand: Min assist;Min guard         General transfer comment: light min A to stand at bedisde; very motivated to perform independently; cueing for posture  Ambulation/Gait Ambulation/Gait assistance: Min assist;Min guard Gait Distance (Feet): 30 Feet Assistive device: Rolling walker (2 wheeled) Gait Pattern/deviations: Step-through pattern;Decreased stride  length;Trunk flexed;Narrow base of support Gait velocity: decreased   General Gait Details: limited gait in room per patient; cueing for safety and sequencing   Stairs             Wheelchair Mobility    Modified Rankin (Stroke Patients Only)       Balance Overall balance assessment: Needs assistance Sitting-balance support: No upper extremity supported;Feet supported Sitting balance-Leahy Scale: Good     Standing balance support: Bilateral upper extremity supported;During functional activity Standing balance-Leahy Scale: Poor Standing balance comment: RW                            Cognition Arousal/Alertness: Awake/alert Behavior During Therapy: Flat affect Overall Cognitive Status: Within Functional Limits for tasks assessed                                        Exercises      General Comments        Pertinent Vitals/Pain Pain Assessment: Faces Faces Pain Scale: Hurts even more Pain Location: Sacral wound Pain Descriptors / Indicators: Grimacing;Discomfort Pain Intervention(s): Limited activity within patient's tolerance;Monitored during session;Repositioned    Home Living                      Prior Function            PT Goals (current goals can now be found in the care plan section) Acute Rehab PT Goals Patient Stated Goal: to be home  by his birthday PT Goal Formulation: With patient Time For Goal Achievement: 02/11/19 Potential to Achieve Goals: Good Progress towards PT goals: Progressing toward goals    Frequency    Min 3X/week      PT Plan Current plan remains appropriate    Co-evaluation              AM-PAC PT "6 Clicks" Mobility   Outcome Measure  Help needed turning from your back to your side while in a flat bed without using bedrails?: None Help needed moving from lying on your back to sitting on the side of a flat bed without using bedrails?: A Little Help needed moving to and  from a bed to a chair (including a wheelchair)?: A Little Help needed standing up from a chair using your arms (e.g., wheelchair or bedside chair)?: A Little Help needed to walk in hospital room?: A Little Help needed climbing 3-5 steps with a railing? : Total 6 Click Score: 17    End of Session Equipment Utilized During Treatment: Gait belt Activity Tolerance: Patient tolerated treatment well Patient left: in chair;with call bell/phone within reach Nurse Communication: Mobility status PT Visit Diagnosis: Muscle weakness (generalized) (M62.81);Difficulty in walking, not elsewhere classified (R26.2)     Time: 0511-0211 PT Time Calculation (min) (ACUTE ONLY): 14 min  Charges:  $Gait Training: 8-22 mins                     Kipp Laurence, PT, DPT Supplemental Physical Therapist 02/02/19 1:07 PM Pager: [phone] Office: [phone]

## 2019-02-02 NOTE — Progress Notes (Addendum)
NAME:  Antonio [name]., MRN:  [id number], DOB:  [birth year], LOS: 70 ADMISSION DATE:  11/28/2018, CONSULTATION DATE:  1/3 REFERRING MD:  EDP, CHIEF COMPLAINT:  Dyspnea   Brief History   63 y/o M admitted 1/3 with influenza A causing respiratory failure and cardiogenic shock. Prolonged mechanical ventilation, tracheostomy placed on January 16.  Developed HCAP.  Difficult wean, has failed multiple attempts at downsize with mucus plugging. Repeat resp cx 2/18 Pseduomonas.  Past Medical History  DM2, CAD, HTN, HLD, tobacco use  Significant Hospital Events   1/03  Admit, Influenza A positive 1/06  Self extubated  1/07  Re-intubated  1/08  Self-extubated  1/09  Reintubated, mucus plugging on FOB 1/10  Fever to 103, abx restarted  1/16  Diuresing, increased WOB with weaning efforts.  Trach.  1/20  Failed wean, anxiety / secretions 1/21  Trach revision to XLT distal.  FOB  1/22  Thick secretions barrier to wean  1/24  Failed SBT, fever 1/25  Episode of mucus plugging after chest PT/wound care  2/03  Weaned 15 minutes  2/11  ATC  2/14  Trach changed to #4, mucus plugging >back to #5 XLT 2/15  Failed ATC with increased WOB 2/16  Failed ATC with increased WOB  2/21 back on pressure support 3/3  Changed to cuffless xlt trach  3/5: Brought to bronchoscopy suite where underwent bronchoscopy, the XLT Shiley was pulled back, extensive evaluation of the posterior tracheal wall was performed there was no evidence of current false passages so size 4 cuffless tracheostomy was placed 3/6: Capping trials initiated 3/9 decannulated    Consults:  PCCM Cardiology  General surgery 2/21 for sacral wound  Procedures:  ETT 1/3 >>1/6, 1/7 > 1/8> 1/9> 1/16 Trach 1/16 >>  PEG 1/28 >>     Significant Diagnostic Tests:  UDS 1/4 >negative  ECHO 1/4 >LVEF 20-25%, akinesis of the anteroseptal, anterolateral & apical myocardium, grade 1 diastolic dysfunction, small pericardial effusion without  evidence of hemodynamic compromise. CT Chest / ABD / Pelvis 1/3 > airway thickening, motion artifact, CAD Echo 1/29> LVEF 60-65%, left ventricular hypertrophy, trivial pericardial effusion  Micro Data:  RVP 1/3 > positive for influenza A BCx2 1/3 >No growth UC 1/4 >No Growth Sputum Cx 1/7 > Normal respiratory flora BAL >corynebacterium  Blood Cx 1/10 > neg Sputum 1/21 > abundant corynebacterium striatum Sputum 2/18 > Pseudomonas  Antimicrobials:  Vanco 1/3 x1;    Zosyn 1/30 x1  Tamiflu 1/3 >1/8 Vancomycin 1/10 > 1/12 Ceftaz 1/10 > 1/12; 1/21 >1/22 Emycin 1/12 > 1/22 Cefepime 1/23 > 1/24 Vanc 1/21> 1/28 Colistin 2/19 > Meropenem 2/19 > 2/20 Ceftaz 2/20 > 2/23  Interim history/subjective:  No events overnight  Objective   Blood pressure 136/74, pulse 84, temperature 98.7 F (37.1 C), temperature source Oral, resp. rate 18, height 6' (1.829 m), weight 89.6 kg, SpO2 91 %.        Intake/Output Summary (Last 24 hours) at 02/02/2019 1158 Last data filed at 02/02/2019 0257 Gross per 24 hour  Intake no documentation  Output 450 ml  Net -450 ml   Filed Weights   01/30/19 0500 02/01/19 0648 02/02/19 0257  Weight: 90.4 kg 90.8 kg 89.6 kg   EXAM:   General 63 year old white male resting in bed. He is in no distress.  HENT NCAT no JVD. Vocal quality remains hoarse but tolerated capping trials well pulm CTA no accessory use  Card RRR  abd not tender +  bowel sounds no OM Neuro intact   Resolved Hospital Problem list   AKI Pneumonia Hypokalemia Hypernatremia Cardiomyopathy evidenced via 1/4 ECHO, resolved.  Assessment & Plan:   Trach status False tracheal passage: Resolved Hypoxemia Respiratory failure: Pseudomonas PNA  Discussion Did well w/ capping trials. Now decannulated.   Plan Keep current dressing in place 24-48 hrs then can use regular band aid until stoma completely closed.  If does not close off in 2 weeks will need ENT eval for tracheocutaneous  fistula.  Cont current diet Wean oxygen    Erick Colace ACNP-BC [place] Pager # [phone] OR # [phone] if no answer  Attending Note:  63 year old male with flu A induced respiratory failure that was intubated and subsequently trached.  Patient on exam has clear lungs with capped trach.  I reviewed CXR myself, trach is in a good position.  Discussed with PCCM-NP.    Respiratory failure:  - Monitor closely for airway protection  Hypoxemia:  - Titrate O2 for sat of 88-92% via [place] not TC  Trach status:  - Decannulate  PCCM will sign off, please call back if needed.  Patient seen and examined, agree with above note.  I dictated the care and orders written for this patient under my direction.  Rush Farmer, [place]

## 2019-02-02 NOTE — Progress Notes (Signed)
CSW sent updated FL2 to SNFs for review now that trach has been decannulated.   Osborne Casco [place] LCSW [phone]

## 2019-02-02 NOTE — Progress Notes (Signed)
[place] TE[facility] [place].  [id number] DOB: 1956-08-01 DOA: 11/28/2018 PCP: Imagene Riches, NP     Patient is medically stable ready for discharge await bed for placement.     Brief Narrative:  63yo male with influenza A causing respiratory failure and cardiogenic shock. Prolonged mechanical ventilation, tracheostomy placed on January 16.  Significant Events:  1/6 self extubated  1/7 re-intubated  1/8 self extubated 1/9 acute respiratory failure, mucus plugging right lower lobe, re-intubated, bronchoscopy 1/10 New fever T Max 103.1, antibiotics resumed with ceftaz and vancomycin 1/11 Hemodynamically seems to be improving. Very hypernatremic. X-ray improving. Adding free water, one-time Lasix, follow-up chemistry a.m. Hoping to initiate weaning efforts on 1/12 1/16 Continuing diuresis and ongoing ventilatory support. Working on weaning but still has significant work of breathing. Plan has been to proceed with tracheostomy. Hopefully we can continue ongoing diuresis and continue to decrease sedation 1/20 still struggling to wean. Secretions and anxiety are barriers.  1/21 trach revision to 6 distal XLT, fever, increased secretions, abx broadened / bronch 1/22 ongoing thick secretions 1/24 Failed SBT, low grade fever, Per CXR persistent pleural effusions / basilar consolidation atelectasis vs pneumonia per bases 1/25 Mucus plugged with slow recovery after chest PT and Wound Care 2/3  first time weaned for greater than 15 minutes (previously failed due to agitation, secretions, tachypnea) 2/10 TRH assumed care 2/11 trach changed to a #5 cuffless per PCCM  2/13 trach changed to #4 shiley cuffless  2/13 afternoon - pulled out new trach and "disoldged" PEG - both reinserted/repositioned  2/13 PM > 2/14 AM acute hypoxic failure - emergent bedside bronch > trach obstructed - #6 cuffed trach placed > back to ICU on vent  2/14 trach changed to #5  cuffed 2/15 TRH resumed care - resp distress > back on vent  2/15  Failed ATC with increased WOB 2/16  Failed ATC with increased WOB  2/21 back on pressure support 2/24 back on TC  01/27/19 - 5  XLT cuffless trach    Subjective:  Patient in bed, appears comfortable, denies any headache, no fever, no chest pain or pressure, no shortness of breath , no abdominal pain. No focal weakness.  Assessment & Plan:  Acute hypoxic respiratory failure - Influenza A / Corynebacterium pneumonia -was under the care of ICU, required multiple intubations thereafter tracheostomy, much improved after he received a treatment of Mucomyst on 01/26/2019 followed by suctioning, also activity has been increased he has been encouraged to sit up in chair, repeat Mucomyst treatment on 01/27/2019.  He is about to finish his IV ceftaz course with a stop date of 01/28/2019.  Clinically respiratory failure much improved.  Defer trach management to pulmonary, of note his trach was changed to 5 XLT cuffless trach on 01/27/2019 by pulmonary.  He might get decannulated soon.  Acute systolic heart failure  -  no plans for cardiac cath per Cardiology - EF 20-25% via TTE 11/29/18, but improved to 60-65% on f/u 1/29 - likely due to sepsis - cont BB and ARB - monitor weight closely and monitor clinically closely.  Currently no need for diuretics.  Filed Weights   01/30/19 0500 02/01/19 0648 02/02/19 0257  Weight: 90.4 kg 90.8 kg 89.6 kg    Dysphagia.  Due to tracheostomy.  Did receive PEG tube feeds currently speech therapy has advanced him to a dysphagia 2 diet which she is tolerating well along with protein supplements will continue to monitor.  Acute metabolic encephalopathy - resolved.  Deconditioning -stable will require placement.     Sacral decub - Per wound care - General Surgery consulted on 2/21> no need for surgical debridement.  DM2 -continue present Levemir and sliding scale dose Levemir dose has been reduced to 25 twice  daily  CBG (last 3)  Recent Labs    02/01/19 1657 02/01/19 2209 02/02/19 0746  GLUCAP 128* 80 79   Lab Results  Component Value Date   HGBA1C 7.3 (H) 01/21/2019     DVT prophylaxis: lovenox  Code Status: FULL CODE Family Communication: no family present at time of exam  Disposition Plan: stable for tele bed - cont trach care - complete abx tx - ready for d/c to SNF for rehab stay w/ ultimate plan for d/c home   Consultants:  PCCM Cardiology   Antimicrobials:  Ceftaz 2/20 >  Objective: Blood pressure 136/74, pulse 80, temperature 98.7 F (37.1 C), temperature source Oral, resp. rate 18, height 6' (1.829 m), weight 89.6 kg, SpO2 92 %.  Intake/Output Summary (Last 24 hours) at 02/02/2019 0829 Last data filed at 02/02/2019 0257 Gross per 24 hour  Intake -  Output 450 ml  Net -450 ml   Filed Weights   01/30/19 0500 02/01/19 0648 02/02/19 0257  Weight: 90.4 kg 90.8 kg 89.6 kg    Examination:  Awake Alert, Oriented X 3, No new F.N deficits, Normal affect [place].AT,PERRAL Supple Neck,No JVD, No cervical lymphadenopathy appriciated.  Symmetrical Chest wall movement, Good air movement bilaterally, CTAB RRR,No Gallops, Rubs or new Murmurs, No Parasternal Heave +ve B.Sounds, Abd Soft, No tenderness, No organomegaly appriciated, No rebound - guarding or rigidity. No Cyanosis, Clubbing or edema, Trach site appears clean with trach collar in place, PEG tube in place site clean, left BKA, right first toe ray amputation   CBC: Recent Labs  Lab 01/27/19 0458 02/01/19 0835  WBC 5.7 7.8  HGB 10.3* 11.7*  HCT 32.8* 37.3*  MCV 87.9 87.1  PLT 198 637   Basic Metabolic Panel: Recent Labs  Lab 01/27/19 0458 02/01/19 0835  NA 136 136  K 4.3 4.0  CL 96* 96*  CO2 33* 29  GLUCOSE 177* 97  BUN 25* 21*  CREATININE 0.55* 0.73  CALCIUM 9.0 9.5  MG 1.9  --   PHOS 4.3  --    GFR: Estimated Creatinine Clearance: 107.8 mL/min (by C-G formula based on SCr of 0.73 mg/dL).  Liver  Function Tests: Recent Labs  Lab 01/27/19 0458  AST 14*  ALT 18  ALKPHOS 57  BILITOT 0.4  PROT 6.6  ALBUMIN 2.6*     HbA1C: Hgb A1c MFr Bld  Date/Time Value Ref Range Status  01/21/2019 04:01 AM 7.3 (H) 4.8 - 5.6 % Final    Comment:    (NOTE) Pre diabetes:          5.7%-6.4% Diabetes:              >6.4% Glycemic control for   <7.0% adults with diabetes   03/25/2017 05:59 AM 12.3 (H) 4.8 - 5.6 % Final    Comment:    (NOTE)         Pre-diabetes: 5.7 - 6.4         Diabetes: >6.4         Glycemic control for adults with diabetes: <7.0     CBG: Recent Labs  Lab 02/01/19 0819 02/01/19 1231 02/01/19 1657 02/01/19 2209 02/02/19 0746  GLUCAP 87 134* 128* 80 79  Scheduled Meds: . arformoterol  15 mcg Nebulization BID  . aspirin  81 mg Per Tube Daily  . atorvastatin  40 mg Per Tube q1800  . budesonide (PULMICORT) nebulizer solution  0.5 mg Nebulization BID  . carvedilol  3.125 mg Per Tube BID WC  . chlorhexidine gluconate (MEDLINE KIT)  15 mL Mouth Rinse BID  . collagenase   Topical Daily  . enoxaparin (LOVENOX) injection  40 mg Subcutaneous Q24H  . famotidine  20 mg Per Tube BID  . feeding supplement (PRO-STAT SUGAR FREE 64)  30 mL Oral TID WC  . folic acid  1 mg Per Tube Daily  . guaiFENesin  5 mL Per Tube Q12H  . insulin aspart  0-20 Units Subcutaneous TID WC  . insulin detemir  25 Units Subcutaneous BID  . mouth rinse  15 mL Mouth Rinse QID  . multivitamin  15 mL Per Tube Daily  . nutrition supplement (JUVEN)  1 packet Per Tube BID BM  . tapentadol  50 mg Oral QID  . thiamine  100 mg Per Tube Daily     LOS: 66 days   Signature  Lala Lund M.D on 02/02/2019 at 8:29 AM   -  To page go to [email or web address]         [place] TEAM [facility] - [place].  [id number] DOB: 1956-11-20 DOA: 11/28/2018 PCP: Imagene Riches, NP    Brief Narrative:  63yo male with influenza A causing respiratory failure and cardiogenic shock.  Prolonged mechanical ventilation, tracheostomy placed on January 16.  Significant Events:  1/6 self extubated  1/7 re-intubated  1/8 self extubated 1/9 acute respiratory failure, mucus plugging right lower lobe, re-intubated, bronchoscopy 1/10 New fever T Max 103.1, antibiotics resumed with ceftaz and vancomycin 1/11 Hemodynamically seems to be improving. Very hypernatremic. X-ray improving. Adding free water, one-time Lasix, follow-up chemistry a.m. Hoping to initiate weaning efforts on 1/12 1/16 Continuing diuresis and ongoing ventilatory support. Working on weaning but still has significant work of breathing. Plan has been to proceed with tracheostomy. Hopefully we can continue ongoing diuresis and continue to decrease sedation 1/20 still struggling to wean. Secretions and anxiety are barriers.  1/21 trach revision to 6 distal XLT, fever, increased secretions, abx broadened / bronch 1/22 ongoing thick secretions 1/24 Failed SBT, low grade fever, Per CXR persistent pleural effusions / basilar consolidation atelectasis vs pneumonia per bases 1/25 Mucus plugged with slow recovery after chest PT and Wound Care 2/3  first time weaned for greater than 15 minutes (previously failed due to agitation, secretions, tachypnea) 2/10 TRH assumed care 2/11 trach changed to a #5 cuffless per PCCM  2/13 trach changed to #4 shiley cuffless  2/13 afternoon - pulled out new trach and "disoldged" PEG - both reinserted/repositioned  2/13 PM > 2/14 AM acute hypoxic failure - emergent bedside bronch > trach obstructed - #6 cuffed trach placed > back to ICU on vent  2/14 trach changed to #5 cuffed 2/15 TRH resumed care - resp distress > back on vent  2/15  Failed ATC with increased WOB 2/16  Failed ATC with increased WOB  2/21 back on pressure support 2/24 back on TC  01/27/19 - 5  XLT cuffless trach  3/5/  - trach change to cuff less #4, trach capped on 01/30/2019   Subjective:  Patient in bed, appears  comfortable, denies any headache, no fever, no chest pain or pressure, no shortness of breath , no abdominal pain.  No focal weakness.   Assessment & Plan:  Acute hypoxic respiratory failure - Influenza A / Corynebacterium pneumonia -was under the care of ICU, required multiple intubations thereafter tracheostomy, much improved after he received a treatment of Mucomyst on 01/26/2019 followed by suctioning, also activity has been increased he has been encouraged to sit up in chair, repeat Mucomyst treatment on 01/27/2019.  He is about to finish his IV ceftaz course with a stop date of 01/28/2019.  Clinically respiratory failure much improved.  Defer trach management to pulmonary, his trach has been changed to cuffless #4 on 01/29/2019 thereafter has been capped by pulmonary on 01/30/2019, patient has tolerated this procedure well.   Acute systolic heart failure  -  no plans for cardiac cath per Cardiology - EF 20-25% via TTE 11/29/18, but improved to 60-65% on f/u 1/29 - likely due to sepsis - cont BB and ARB - monitor weight closely and monitor clinically closely.  Currently no need for diuretics.  Filed Weights   01/30/19 0500 02/01/19 0648 02/02/19 0257  Weight: 90.4 kg 90.8 kg 89.6 kg    Dysphagia.  Due to tracheostomy.  He was on PEG tube feeds with speech therapy following him, transition to dysphagia 2 diet on 01/31/2019 by speech therapy, PEG tube feeds have been held will monitor closely.  Acute metabolic encephalopathy - resolved.  Deconditioning -stable will require placement.     Sacral decub - Per wound care - General Surgery consulted on 2/21> no need for surgical debridement.  DM2 - ISS -  currently on Lantus and sliding scale, since PEG tube feeds have been stopped and he has been placed on a dysphagia 2 diet will cut down high-dose Lantus slightly and monitor cautiously.  CBG (last 3)  Recent Labs    02/01/19 1657 02/01/19 2209 02/02/19 0746  GLUCAP 128* 80 79   Lab Results    Component Value Date   HGBA1C 7.3 (H) 01/21/2019     DVT prophylaxis: lovenox  Code Status: FULL CODE Family Communication: no family present at time of exam  Disposition Plan: stable for tele bed - cont trach care - complete abx tx - ready for d/c to SNF for rehab stay w/ ultimate plan for d/c home   Consultants:  PCCM Cardiology   Antimicrobials:  Ceftaz 2/20 >  Objective: Blood pressure 136/74, pulse 80, temperature 98.7 F (37.1 C), temperature source Oral, resp. rate 18, height 6' (1.829 m), weight 89.6 kg, SpO2 92 %.  Intake/Output Summary (Last 24 hours) at 02/02/2019 0829 Last data filed at 02/02/2019 0257 Gross per 24 hour  Intake -  Output 450 ml  Net -450 ml   Filed Weights   01/30/19 0500 02/01/19 0648 02/02/19 0257  Weight: 90.4 kg 90.8 kg 89.6 kg    Examination:  Awake Alert, Oriented X 3, No new F.N deficits, Normal affect .AT,PERRAL Supple Neck,No JVD, No cervical lymphadenopathy appriciated.  Symmetrical Chest wall movement, Good air movement bilaterally, CTAB RRR,No Gallops, Rubs or new Murmurs, No Parasternal Heave +ve B.Sounds, Abd Soft, No tenderness, No organomegaly appriciated, No rebound - guarding or rigidity. No Cyanosis, Clubbing or edema,  Trach site appears clean Trach now capped, PEG tube in place site clean, left BKA, right first toe ray amputation   CBC: Recent Labs  Lab 01/27/19 0458 02/01/19 0835  WBC 5.7 7.8  HGB 10.3* 11.7*  HCT 32.8* 37.3*  MCV 87.9 87.1  PLT 198 397   Basic Metabolic Panel: Recent Labs  Lab  01/27/19 0458 02/01/19 0835  NA 136 136  K 4.3 4.0  CL 96* 96*  CO2 33* 29  GLUCOSE 177* 97  BUN 25* 21*  CREATININE 0.55* 0.73  CALCIUM 9.0 9.5  MG 1.9  --   PHOS 4.3  --    GFR: Estimated Creatinine Clearance: 107.8 mL/min (by C-G formula based on SCr of 0.73 mg/dL).  Liver Function Tests: Recent Labs  Lab 01/27/19 0458  AST 14*  ALT 18  ALKPHOS 57  BILITOT 0.4  PROT 6.6  ALBUMIN 2.6*      HbA1C: Hgb A1c MFr Bld  Date/Time Value Ref Range Status  01/21/2019 04:01 AM 7.3 (H) 4.8 - 5.6 % Final    Comment:    (NOTE) Pre diabetes:          5.7%-6.4% Diabetes:              >6.4% Glycemic control for   <7.0% adults with diabetes   03/25/2017 05:59 AM 12.3 (H) 4.8 - 5.6 % Final    Comment:    (NOTE)         Pre-diabetes: 5.7 - 6.4         Diabetes: >6.4         Glycemic control for adults with diabetes: <7.0     CBG: Recent Labs  Lab 02/01/19 0819 02/01/19 1231 02/01/19 1657 02/01/19 2209 02/02/19 0746  GLUCAP 87 134* 128* 80 79     Scheduled Meds: . arformoterol  15 mcg Nebulization BID  . aspirin  81 mg Per Tube Daily  . atorvastatin  40 mg Per Tube q1800  . budesonide (PULMICORT) nebulizer solution  0.5 mg Nebulization BID  . carvedilol  3.125 mg Per Tube BID WC  . chlorhexidine gluconate (MEDLINE KIT)  15 mL Mouth Rinse BID  . collagenase   Topical Daily  . enoxaparin (LOVENOX) injection  40 mg Subcutaneous Q24H  . famotidine  20 mg Per Tube BID  . feeding supplement (PRO-STAT SUGAR FREE 64)  30 mL Oral TID WC  . folic acid  1 mg Per Tube Daily  . guaiFENesin  5 mL Per Tube Q12H  . insulin aspart  0-20 Units Subcutaneous TID WC  . insulin detemir  25 Units Subcutaneous BID  . mouth rinse  15 mL Mouth Rinse QID  . multivitamin  15 mL Per Tube Daily  . nutrition supplement (JUVEN)  1 packet Per Tube BID BM  . tapentadol  50 mg Oral QID  . thiamine  100 mg Per Tube Daily     LOS: 66 days   Signature  Lala Lund M.D on 02/02/2019 at 8:29 AM   -  To page go to [email or web address]

## 2019-02-02 NOTE — Progress Notes (Signed)
Patient decannulated by Kreg Shropshire NP.

## 2019-02-02 NOTE — Plan of Care (Signed)
  Problem: Coping: Goal: Level of anxiety will decrease Outcome: Progressing   Problem: Safety: Goal: Ability to remain free from injury will improve Outcome: Progressing   Problem: Skin Integrity: Goal: Risk for impaired skin integrity will decrease Outcome: Progressing   Problem: Education: Goal: Ability to describe self-care measures that may prevent or decrease complications (Diabetes Survival Skills Education) will improve Outcome: Progressing Goal: Individualized Educational Video(s) Outcome: Progressing   Problem: Coping: Goal: Ability to adjust to condition or change in health will improve Outcome: Progressing   Problem: Fluid Volume: Goal: Ability to maintain a balanced intake and output will improve Outcome: Progressing   Problem: Health Behavior/Discharge Planning: Goal: Ability to identify and utilize available resources and services will improve Outcome: Progressing Goal: Ability to manage health-related needs will improve Outcome: Progressing   Problem: Metabolic: Goal: Ability to maintain appropriate glucose levels will improve Outcome: Progressing   Problem: Nutritional: Goal: Maintenance of adequate nutrition will improve Outcome: Progressing Goal: Progress toward achieving an optimal weight will improve Outcome: Progressing   Problem: Skin Integrity: Goal: Risk for impaired skin integrity will decrease Outcome: Progressing   Problem: Tissue Perfusion: Goal: Adequacy of tissue perfusion will improve Outcome: Progressing   Problem: Education: Goal: Knowledge about tracheostomy care/management will improve Outcome: Progressing   Problem: Activity: Goal: Ability to tolerate increased activity will improve Outcome: Progressing   Problem: Health Behavior/Discharge Planning: Goal: Ability to manage tracheostomy will improve Outcome: Progressing   Problem: Respiratory: Goal: Patent airway maintenance will improve Outcome: Progressing    Problem: Role Relationship: Goal: Ability to communicate will improve Outcome: Progressing   

## 2019-02-02 NOTE — NC FL2 (Signed)
[place] LEVEL OF CARE SCREENING TOOL     IDENTIFICATION  Patient Name: Antonio [name]. Birthdate: 1956/10/14 Sex: male Admission Date (Current Location): 11/28/2018  [facility] and [place] Number:  Herbalist and Address:  [facility] [place], [place] [address], [place], [place] [address]      Provider Number: [id number]  Attending Physician Name and Address:  Thurnell Lose, MD  Relative Name and Phone Number:  Arihaan, Bellucci [phone]     Current Level of Care: Hospital Recommended Level of Care: [place] Prior Approval Number:    Date Approved/Denied:   PASRR Number: [id number]  Discharge Plan: SNF    Current Diagnoses: Patient Active Problem List   Diagnosis Date Noted  . Airway trauma   . Acute respiratory failure ([place])   . Dysphagia   . Status post tracheostomy ([place])   . HCAP (healthcare-associated pneumonia)   . Diabetes mellitus type 2 in nonobese (HCC)   . Essential hypertension   . S/P BKA (below knee amputation) unilateral, left ([place])   . Tachypnea   . Pain   . Agitation   . Acute blood loss anemia   . Phlegm in throat   . Fever, unspecified   . Tachycardia   . Acute on chronic respiratory failure ([place])   . Hypoxemia   . Acute pulmonary edema (HCC)   . Hypoxia   . Tracheostomy status ([place])   . Pneumonia   . Influenza A virus present   . Pressure injury of skin 12/01/2018  . SIRS (systemic inflammatory response syndrome) ([place]) 11/28/2018  . Acute respiratory failure with hypoxemia ([place]) 11/28/2018  . Influenza A   . Hyponatremia   . Chest pain 05/02/2018  . Hyperlipidemia 05/02/2018  . Tobacco abuse 05/02/2018  . Coronary artery disease involving native coronary artery of native heart with angina pectoris ([place]) 05/02/2018  . S/P transmetatarsal amputation of foot, left ([place]) 04/15/2017  . Amputated great toe of right foot ([place]) 04/15/2017  . Ulcer of toe of left foot, limited  to breakdown of skin ([place]) 04/04/2017  . Partial nontraumatic amputation of foot, right ([place]) 04/04/2017  . 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 Edema     Orientation RESPIRATION BLADDER Height & Weight     Self, Time, Situation, Place  Normal Continent Weight: 89.6 kg Height:  6' (182.9 cm)  BEHAVIORAL SYMPTOMS/MOOD NEUROLOGICAL BOWEL NUTRITION STATUS      Continent Feeding tube  AMBULATORY STATUS COMMUNICATION OF NEEDS Skin   Extensive Assist Verbally Other (Comment)(Unstageable - Full thickness tissue loss in which the base of the ulcer is covered by slough (yellow, tan, gray, green or brown) and/or eschar (tan, brown or black) in the wound bed., on sacrum, posterior.) PU Stage 1 Dressing: Daily(Sacrum - daily dressing change)                     Personal Care Assistance Level of Assistance  Bathing, Feeding, Dressing Bathing Assistance: Maximum assistance Feeding assistance: Maximum assistance Dressing Assistance: Maximum assistance Total Care Assistance: Maximum assistance   Functional Limitations Info  Sight, Hearing, Speech Sight Info: Adequate Hearing Info: Adequate Speech Info: Adequate    SPECIAL CARE FACTORS FREQUENCY  PT (By licensed PT), OT (By licensed OT), Speech therapy     PT Frequency: 5 times a week OT Frequency:  5 times a week     Speech Therapy Frequency: 5 times a  week       Contractures Contractures Info: Not present    Additional Factors Info  Code Status, Allergies Code Status Info: Full Allergies Info: Iodinated Diagnostic Agents, Penicillins Psychotropic Info: Seroquel 268m Insulin Sliding Scale Info: 2x/daily and every 4 hours       Current Medications (02/02/2019):  This is the current hospital active medication list Current Facility-Administered Medications  Medication Dose Route Frequency Provider Last Rate Last Dose  . 0.9 %  sodium chloride infusion   Intravenous PRN SJennelle HumanB, NP 10 mL/hr at  01/24/19 2000    . acetaminophen (TYLENOL) solution 650 mg  650 mg Per Tube Q6H PRN MCherene Altes MD   650 mg at 01/17/19 1539  . albuterol (PROVENTIL) (2.5 MG/3ML) 0.083% nebulizer solution 2.5 mg  2.5 mg Nebulization Q2H PRN MSimonne MaffucciB, MD   2.5 mg at 12/20/18 1000  . alum & mag hydroxide-simeth (MAALOX/MYLANTA) 200-200-20 MG/5ML suspension 30 mL  30 mL Oral Q6H PRN Deterding, EGuadelupe Sabin MD   30 mL at 01/24/19 0507   And  . lidocaine (XYLOCAINE) 2 % viscous mouth solution 15 mL  15 mL Oral Q6H PRN Deterding, EGuadelupe Sabin MD      . arformoterol (BROVANA) nebulizer solution 15 mcg  15 mcg Nebulization BID MJuanito Doom MD   15 mcg at 02/02/19 0826  . aspirin chewable tablet 81 mg  81 mg Per Tube Daily AAldean Jewett MD   81 mg at 02/02/19 0937  . atorvastatin (LIPITOR) tablet 40 mg  40 mg Per Tube q1800 MCherene Altes MD   40 mg at 02/01/19 1711  . bisacodyl (DULCOLAX) suppository 10 mg  10 mg Rectal Daily PRN OFrederik Pear MD   10 mg at 01/24/19 2159  . budesonide (PULMICORT) nebulizer solution 0.5 mg  0.5 mg Nebulization BID MSimonne MaffucciB, MD   0.5 mg at 02/02/19 0826  . carvedilol (COREG) tablet 3.125 mg  3.125 mg Per Tube BID WC Ollis, Brandi L, NP   3.125 mg at 02/02/19 0924  . chlorhexidine gluconate (MEDLINE KIT) (PERIDEX) 0.12 % solution 15 mL  15 mL Mouth Rinse BID MSimonne MaffucciB, MD   15 mL at 02/01/19 2051  . clonazePAM (KLONOPIN) disintegrating tablet 0.25 mg  0.25 mg Per Tube BID PRN MHarvel Quale RPH   0.25 mg at 01/24/19 2158  . collagenase (SANTYL) ointment   Topical Daily MSimonne MaffucciB, MD      . docusate (COLACE) 50 MG/5ML liquid 100 mg  100 mg Per Tube BID PRN ONoe GensL, NP   100 mg at 01/31/19 1224  . enoxaparin (LOVENOX) injection 40 mg  40 mg Subcutaneous Q24H EMargaretha Seeds MD   40 mg at 02/02/19 09678 . famotidine (PEPCID) 40 MG/5ML suspension 20 mg  20 mg Per Tube BID SJennelle HumanB, NP   20 mg at 02/02/19 0937   . feeding supplement (PRO-STAT SUGAR FREE 64) liquid 30 mL  30 mL Oral TID WC SThurnell Lose MD   30 mL at 02/01/19 1711  . folic acid (FOLVITE) tablet 1 mg  1 mg Per Tube Daily MHarvel Quale RPH   1 mg at 02/02/19 09381 . guaiFENesin (ROBITUSSIN) 100 MG/5ML solution 100 mg  5 mL Per Tube Q12H GMagdalen Spatz NP   100 mg at 02/02/19 0925  . insulin aspart (novoLOG) injection 0-20 Units  0-20 Units Subcutaneous TID WC Singh, Prashant K,  MD   3 Units at 02/01/19 1710  . insulin detemir (LEVEMIR) injection 25 Units  25 Units Subcutaneous BID Thurnell Lose, MD   25 Units at 02/01/19 2250  . ipratropium-albuterol (DUONEB) 0.5-2.5 (3) MG/3ML nebulizer solution 3 mL  3 mL Nebulization Q6H PRN Thurnell Lose, MD      . MEDLINE mouth rinse  15 mL Mouth Rinse QID Collene Gobble, MD   15 mL at 02/01/19 1600  . multivitamin liquid 15 mL  15 mL Per Tube Daily Cherene Altes, MD   15 mL at 02/02/19 0180  . nutrition supplement (JUVEN) (JUVEN) powder packet 1 packet  1 packet Per Tube BID BM Juanito Doom, MD   1 packet at 02/02/19 [phone]  . oxyCODONE (Oxy IR/ROXICODONE) immediate release tablet 5-10 mg  5-10 mg Oral Q4H PRN Cherene Altes, MD   5 mg at 02/02/19 0924  . polyethylene glycol (MIRALAX / GLYCOLAX) packet 17 g  17 g Per Tube Daily PRN Rory Percy, DO   17 g at 01/30/19 1816  . Resource ThickenUp Clear   Oral PRN Thurnell Lose, MD      . tapentadol (NUCYNTA) tablet 50 mg  50 mg Oral QID Icard, Bradley L, DO   50 mg at 02/02/19 0936  . thiamine (VITAMIN B-1) tablet 100 mg  100 mg Per Tube Daily Harvel Quale, RPH   100 mg at 02/02/19 4925  . traMADol (ULTRAM) tablet 50 mg  50 mg Oral Q6H PRN Cherene Altes, MD   50 mg at 01/29/19 0906     Discharge Medications: Please see discharge summary for a list of discharge medications.  Relevant Imaging Results:  Relevant Lab Results:   Additional Information [id number]  Benard Halsted, LCSW

## 2019-02-02 NOTE — Progress Notes (Signed)
Physical Therapy Wound Treatment Patient Details  Name: Antonio [name]. MRN: [id number] Date of Birth: [birth year]  Today's Date: 02/02/2019 Time: 1021-1058 Time Calculation (min): 37 min  Subjective  Subjective: Pt mother and father in-law in room, Pt states no one changed his dressing yesterday.  Patient and Family Stated Goals: "go home." Date of Onset: (unknown) Prior Treatments: dressing changes  Pain Score:  Pt reports 9/10 pain with pulse lavage and debridement despite receiving pain medication prior to hydrotherapy session.   Wound Assessment  Pressure Injury 12/01/18 Unstageable - Full thickness tissue loss in which the base of the ulcer is covered by slough (yellow, tan, gray, green or brown) and/or eschar (tan, brown or black) in the wound bed. sacrum (Active)  Dressing Type Barrier Film (skin prep);ABD;Moist to moist;Gauze (Comment);Other (Comment) 02/02/2019  1:00 PM  Dressing Clean;Dry;Intact;Changed 02/02/2019  1:00 PM  Dressing Change Frequency Daily 02/02/2019  1:00 PM  State of Healing Early/partial granulation 02/02/2019  1:00 PM  Site / Wound Assessment Red;Yellow;Pink 02/02/2019  1:00 PM  % Wound base Red or Granulating 60% 02/02/2019  1:00 PM  % Wound base Yellow/Fibrinous Exudate 40% 02/02/2019  1:00 PM  % Wound base Black/Eschar 0% 02/02/2019  1:00 PM  % Wound base Other/Granulation Tissue (Comment) 0% 02/02/2019  1:00 PM  Peri-wound Assessment Erythema (blanchable) 02/02/2019  1:00 PM  Wound Length (cm) 8.5 cm 01/28/2019 11:16 AM  Wound Width (cm) 5 cm 01/28/2019 11:16 AM  Wound Depth (cm) 1.5 cm 01/28/2019 11:16 AM  Wound Surface Area (cm^2) 42.5 cm^2 01/28/2019 11:16 AM  Wound Volume (cm^3) 63.75 cm^3 01/28/2019 11:16 AM  Tunneling (cm) 0 12/31/2018 10:28 AM  Margins Unattached edges (unapproximated) 02/02/2019  1:00 PM  Drainage Amount Minimal 02/02/2019  1:00 PM  Drainage Description Serosanguineous;Odor 02/02/2019  1:00 PM  Treatment Debridement (Selective);Hydrotherapy (Pulse  lavage);Packing (Saline gauze) 02/02/2019  1:00 PM   Santyl applied to wound bed   Hydrotherapy Pulsed lavage therapy - wound location: sacrum Pulsed Lavage with Suction (psi): 8 psi(8-12) Pulsed Lavage with Suction - Normal Saline Used: 1000 mL Pulsed Lavage Tip: Tip with splash shield Selective Debridement Selective Debridement - Location: sacrum Selective Debridement - Tools Used: Forceps;Scissors Selective Debridement - Tissue Removed: yellow necrotic tissue   Wound Assessment and Plan  Wound Therapy - Assess/Plan/Recommendations Wound Therapy - Clinical Statement: Pt dressing with a moderate amount of malodorus drainage. Pt reports his dressing was not changed yesterday. Pt will continue to benefit from MWF hydrotherapy for removing bioburden and promoting healing.  Wound Therapy - Functional Problem List: decr mobility Factors Delaying/Impairing Wound Healing: Diabetes Mellitus;Immobility;Multiple medical problems Hydrotherapy Plan: Debridement;Dressing change;Patient/family education;Pulsatile lavage with suction Wound Therapy - Frequency: 3X / week Wound Therapy - Follow Up Recommendations: Skilled nursing facility Wound Plan: see above  Wound Therapy Goals- Improve the function of patient's integumentary system by progressing the wound(s) through the phases of wound healing (inflammation - proliferation - remodeling) by: Decrease Necrotic Tissue to: 30 Decrease Necrotic Tissue - Progress: Progressing toward goal Increase Granulation Tissue to: 70 Increase Granulation Tissue - Progress: Progressing toward goal Goals/treatment plan/discharge plan were made with and agreed upon by patient/family: Yes Time For Goal Achievement: 7 days Wound Therapy - Potential for Goals: Fair  Goals will be updated until maximal potential achieved or discharge criteria met.  Discharge criteria: when goals achieved, discharge from hospital, MD decision/surgical intervention, no progress towards  goals, refusal/missing three consecutive treatments without notification or medical reason.  GP  Makylie Rivere B. Migdalia Dk PT, DPT Acute Rehabilitation Services Pager [phone] Office [phone]  [place] 02/02/2019, 1:34 PM

## 2019-02-02 NOTE — Progress Notes (Signed)
  Speech Language Pathology Treatment: Dysphagia  Patient Details Name: Antonio [name]. MRN: [id number] DOB: 1956/02/20 Today's Date: 02/02/2019 Time: 1499-6924 SLP Time Calculation (min) (ACUTE ONLY): 11 min  Assessment / Plan / Recommendation Clinical Impression  Pt decannulated this am- vocal quality breathy as to be expected. Reiterated hand placement over bandage to facilitate air ascending to vocal cords and pressure needed for safe swallow. Pt independently recalled swallow strategy needing min cues to implement. Delayed cough x 1 during observation of honey thick and trial of upgraded Dys 3 texture. Surprisingly no significant complaints about thickened liquids; mild displeasure of food. Mechanical soft texture masticated and transited without difficulty- upgrade to Dys 3, continue honey thick. Once stoma adequately closed, recommend repeat MBS for possible liquid consistency upgrade.    HPI HPI: 63 year old male coming in with fever cough chest pain and shortness of breath was found to have influenza A. PMH: diabetes mellitus, hypertension.  Intubated multiple times 1/3 to 1/16 with trach placed 1/16. Repeat MBS on 3/28 for possible initiation of po's.       SLP Plan  Continue with current plan of care       Recommendations  Diet recommendations: Dysphagia 3 (mechanical soft);Honey-thick liquid Liquids provided via: Cup Medication Administration: Whole meds with puree Supervision: Patient able to self feed;Intermittent supervision to cue for compensatory strategies Compensations: Slow rate;Small sips/bites;Multiple dry swallows after each bite/sip;Clear throat intermittently Postural Changes and/or Swallow Maneuvers: Seated upright 90 degrees                Oral Care Recommendations: Oral care BID Follow up Recommendations: Skilled Nursing facility SLP Visit Diagnosis: Dysphagia, oropharyngeal phase (R13.12) Plan: Continue with current plan of care                Royce Macadamia 02/02/2019, 1:30 PM  Breck Coons Lonell Face.Ed Nurse, children's [phone] Office [phone]

## 2019-02-03 ENCOUNTER — Inpatient Hospital Stay (HOSPITAL_COMMUNITY): Payer: Medicaid Other

## 2019-02-03 LAB — BASIC METABOLIC PANEL
ANION GAP: 9 (ref 5–15)
BUN: 21 mg/dL (ref 8–23)
CO2: 28 mmol/L (ref 22–32)
Calcium: 9.1 mg/dL (ref 8.9–10.3)
Chloride: 96 mmol/L — ABNORMAL LOW (ref 98–111)
Creatinine, Ser: 0.71 mg/dL (ref 0.61–1.24)
GFR calc Af Amer: >60 mL/min (ref >60)
GFR calc non Af Amer: >60 mL/min (ref >60)
Glucose, Bld: 136 mg/dL — ABNORMAL HIGH (ref 70–99)
Potassium: 4 mmol/L (ref 3.5–5.1)
Sodium: 133 mmol/L — ABNORMAL LOW (ref 135–145)

## 2019-02-03 LAB — CBC
HCT: 37.7 % — ABNORMAL LOW (ref 39.0–52.0)
Hemoglobin: 12 g/dL — ABNORMAL LOW (ref 13.0–17.0)
MCH: 27.3 pg (ref 26.0–34.0)
MCHC: 31.8 g/dL (ref 30.0–36.0)
MCV: 85.7 fL (ref 80.0–100.0)
NRBC: 0 % (ref 0.0–0.2)
Platelets: 160 10*3/uL (ref 150–400)
RBC: 4.4 MIL/uL (ref 4.22–5.81)
RDW: 16.5 % — ABNORMAL HIGH (ref 11.5–15.5)
WBC: 12.8 10*3/uL — ABNORMAL HIGH (ref 4.0–10.5)

## 2019-02-03 LAB — GLUCOSE, CAPILLARY
GLUCOSE-CAPILLARY: 178 mg/dL — AB (ref 70–99)
GLUCOSE-CAPILLARY: 166 mg/dL — AB (ref 70–99)
Glucose-Capillary: 150 mg/dL — ABNORMAL HIGH (ref 70–99)
Glucose-Capillary: 143 mg/dL — ABNORMAL HIGH (ref 70–99)
Glucose-Capillary: 217 mg/dL — ABNORMAL HIGH (ref 70–99)

## 2019-02-03 MED ORDER — FREE WATER
150.0000 mL | Freq: Three times a day (TID) | Status: DC
  Administered 2019-02-03 – 2019-02-04 (×5): 150 mL

## 2019-02-03 MED ORDER — SODIUM CHLORIDE 0.9 % IV SOLN
1.0000 g | Freq: Three times a day (TID) | INTRAVENOUS | Status: DC
  Administered 2019-02-03 – 2019-02-04 (×3): 1 g via INTRAVENOUS
  Filled 2019-02-03 (×4): qty 1

## 2019-02-03 MED ORDER — FREE WATER: 100.0000 mL | Freq: Three times a day (TID) | Status: DC

## 2019-02-03 MED ORDER — OSMOLITE 1.2 CAL PO LIQD
1000.0000 mL | ORAL | Status: DC
  Administered 2019-02-03: 1000 mL
  Filled 2019-02-03 (×4): qty 1000

## 2019-02-03 NOTE — Progress Notes (Signed)
Pharmacy Antibiotic Note  Antonio [name]. is a 63 y.o. male admitted on 11/28/2018 with pneumonia.  Pharmacy has been consulted for Merrem dosing.  ID: Pseudomonas PNA. S/p abx for Corynebacterium in RCx, may have been resistant to erythro. Tmax 102.5 overnight. WBC 12.8 up. Aspirated, resume Merrem. Scr WNL - S/p Tamiflu for Flu A  Cefepime 1/23 >> 1/24 Vanc 1/10>1/12, 1/21 >> 1/27 Antonio [name] 1/10>1/12, 1/21 > 1/23; 2/20>> (2/29) Erythromycin 1/12 >> 1/21 2/19 Merrem >>2/20, 3/10>> 2/19 Inh. Colistin>> 2/24  1/25 VP/VT 46.7/22, AUC 780 on 1500mg  q12 >> 1g q12 (SCr 0.79)  2/18 Sputum: mod Pseudomonas (pan-s) 1/21 resp cx: corynebacterium  1/9 resp cx: mod diphtheroids (corynebacterium) 1/10 blood cx:  negF 1/9 resp cx: NF 1/7 resp cx: NF 1/3 blood cx:neg 1/4 urine cx: neg 1/4 mrsa pcr: neg 1/3 RVP: +flu A  Plan: Resume Merrem 1g IV q 8 hrs   Height: 6' (182.9 cm) Weight: 265 lb (120.2 kg) IBW/kg (Calculated) : 77.6  Temp (24hrs), Avg:99.8 F (37.7 C), Min:98.2 F (36.8 C), Max:102.5 F (39.2 C)  Recent Labs  Lab 02/01/19 0835 02/03/19 0732  WBC 7.8 12.8*  CREATININE 0.73 0.71    Estimated Creatinine Clearance: 129.7 mL/min (by C-G formula based on SCr of 0.71 mg/dL).    Allergies  Allergen Reactions  . Iodinated Diagnostic Agents Hives, Rash and Other (See Comments)    Blisters Staph Blisters / staph  . Penicillins Other (See Comments)    UNSPECIFIED REACTION  Has patient had a PCN reaction causing immediate rash, facial/tongue/throat swelling, SOB or lightheadedness with hypotension: No Has patient had a PCN reaction causing severe rash involving mucus membranes or skin necrosis: No Has patient had a PCN reaction that required hospitalization: No Has patient had a PCN reaction occurring within the last 10 years: No If all of the above answers are "NO", then may proceed with Cephalosporin use.     Antonio [name] S. Antonio [name], PharmD, BCPS Clinical Staff  Pharmacist Antonio [name] 02/03/2019 11:59 AM

## 2019-02-03 NOTE — Progress Notes (Signed)
Nutrition Follow-up  INTERVENTION:   Resume TF via PEG: - Osmolite 1.2 @ 75 ml/hr (1800 ml/day) - Free water 150 ml q 8 hours  Tube feeding regimen provides 2160 kcal, 100 grams of protein, and 1926 ml of H2O.  - Continue 1 packet Juven BID via PEG, each packet provides 90 calories, 2.5 grams of protein, 8 grams of carbohydrate, and 14 grams of amino acids (contains CaHMB, glutamine, and arginine to promote wound healing)  - Continue liquid MVI daily via PEG  Total TF regimen provides 2320 kcal, 128 grams of protein, and 1926 ml free water daily (meeting 100% of pt's estimated needs).  - d/c Pro-stat 30 ml TID po  Bolus TF recommendations: - Osmolite 1.2 cal formula 474 ml (2 cans) QID with 30 ml free water flushes before and after each feeding (total of 8 cans daily) - Pro-stat 30 ml daily - Additional free water 100 ml TID  Recommended bolus TF regimen with free water flushes would provide 2375 kcal, 120 grams of protein, and 2095 ml free water.  NUTRITION DIAGNOSIS:   Inadequate oral intake related to inability to eat as evidenced by NPO status.  Ongoing  GOAL:   Patient will meet greater than or equal to 90% of their needs  Met via TF  MONITOR:   PO intake, TF tolerance, Labs, Skin, Weight trends, I & O's  REASON FOR ASSESSMENT:   Consult Enteral/tube feeding initiation and management  ASSESSMENT:   63 y/o M who presented to [facility] 1/3 with fever, cough, chest pain & SOB. Pt found to be Influenza A positive. Developed progressive respiratory distress requiring intubation in the ER.    3/6 - trach capped, MBS with recommendations for Dysphagia 2, honey-thick liquids and TF held 3/9 - decannulated, advanced to Dysphagia 3 with honey-thick liquids 3/10 - NPO due to concern for aspiration  Per MD note, pt "spiked a fever and looks like he aspirated." MD made pt NPO except medications and RD consulted to resume TF.  Discussed pt with RN. RN reports Juven and liquid  MVI going well.  Spoke with pt and family members in room. Pt unaware of plan to resume TF, stating "again?" Pt frustrated with this but understood reasoning. RD explained concern for aspiration and need for pt to meet his nutritional needs via TF if unable to take POs. Pt and family expressed understanding. Will resume continuous TF at this time as aspiration is less likely with a continuous regimen vs a bolus TF regimen. However, RD to leave bolus TF recommendations if needed.  Weights somewhat confusing recently. Per chart, pt maintaining his weight between 195-206 lbs since 12/26/18 until weight of 265 lbs was charted today. Suspect today's weight was entered in error or bed scale error.  Meal Completion: 0-50% x last 6 recorded meals (NPO as of 1045 this morning)  Medications reviewed and include: Pepcid, folic acid, SSI, liquid MVI via tube, Levemir 25 units BID, Juven 2 packets BID via tube, thiamine, IV antibiotics  Labs reviewed: sodium 133 (L) CBG's: 143, 150, 106, 202 x 24 hours  UOP: 1055 ml x 24 hours I/O's: +45.4 L since admit per flowsheet  Diet Order:   Diet Order            Diet NPO time specified Except for: Sips with Meds  Diet effective now              EDUCATION NEEDS:   No education needs have been identified at this  time  Skin:  Skin Assessment: Skin Integrity Issues: Stage I: L head Unstageable: sacrum  Last BM:  3/9  Height:   Ht Readings from Last 1 Encounters:  11/29/18 6' (1.829 m)    Weight:   Wt Readings from Last 1 Encounters:  02/03/19 120.2 kg    Ideal Body Weight:  76.1 kg(adjusted for L BKA)  BMI:  Body mass index is 35.94 kg/m.  Estimated Nutritional Needs:   Kcal:  6004-5997  Protein:  120-150 grams  Fluid:  > 2 L/day    Gaynell Face, MS, RD, LDN Inpatient Clinical Dietitian Pager: [phone] Weekend/After Hours: [phone]

## 2019-02-03 NOTE — Progress Notes (Signed)
[place] TEAM 1 - Stepdown/ICU TEAM  Antonio [name].  ZOX:[id number] DOB: 03-Jul-1956 DOA: 11/28/2018 PCP: Dema Severin, NP     Patient is medically stable ready for discharge await bed for placement.     Brief Narrative:  63yo male with influenza A causing respiratory failure and cardiogenic shock. Prolonged mechanical ventilation, tracheostomy placed on January 16.  Significant Events:  1/6 self extubated  1/7 re-intubated  1/8 self extubated 1/9 acute respiratory failure, mucus plugging right lower lobe, re-intubated, bronchoscopy 1/10 New fever T Max 103.1, antibiotics resumed with ceftaz and vancomycin 1/11 Hemodynamically seems to be improving. Very hypernatremic. X-ray improving. Adding free water, one-time Lasix, follow-up chemistry a.m. Hoping to initiate weaning efforts on 1/12 1/16 Continuing diuresis and ongoing ventilatory support. Working on weaning but still has significant work of breathing. Plan has been to proceed with tracheostomy. Hopefully we can continue ongoing diuresis and continue to decrease sedation 1/20 still struggling to wean. Secretions and anxiety are barriers.  1/21 trach revision to 6 distal XLT, fever, increased secretions, abx broadened / bronch 1/22 ongoing thick secretions 1/24 Failed SBT, low grade fever, Per CXR persistent pleural effusions / basilar consolidation atelectasis vs pneumonia per bases 1/25 Mucus plugged with slow recovery after chest PT and Wound Care 2/3  first time weaned for greater than 15 minutes (previously failed due to agitation, secretions, tachypnea) 2/10 TRH assumed care 2/11 trach changed to a #5 cuffless per PCCM  2/13 trach changed to #4 shiley cuffless  2/13 afternoon - pulled out new trach and "disoldged" PEG - both reinserted/repositioned  2/13 PM > 2/14 AM acute hypoxic failure - emergent bedside bronch > trach obstructed - #6 cuffed trach placed > back to ICU on vent  2/14 trach changed to #5  cuffed 2/15 TRH resumed care - resp distress > back on vent  2/15  Failed ATC with increased WOB 2/16  Failed ATC with increased WOB  2/21 back on pressure support 2/24 back on TC  01/27/19 - 5  XLT cuffless trach    Subjective:  Patient in bed, appears comfortable, denies any headache, no fever, no chest pain or pressure, no shortness of breath , no abdominal pain. No focal weakness.  Assessment & Plan:  Acute hypoxic respiratory failure - Influenza A / Corynebacterium pneumonia -was under the care of ICU, required multiple intubations thereafter tracheostomy, much improved after he received a treatment of Mucomyst on 01/26/2019 followed by suctioning, also activity has been increased he has been encouraged to sit up in chair, repeat Mucomyst treatment on 01/27/2019.  He is about to finish his IV ceftaz course with a stop date of 01/28/2019.  Clinically respiratory failure much improved.  Defer trach management to pulmonary, of note his trach was changed to 5 XLT cuffless trach on 01/27/2019 by pulmonary.  He might get decannulated soon.  Acute systolic heart failure  -  no plans for cardiac cath per Cardiology - EF 20-25% via TTE 11/29/18, but improved to 60-65% on f/u 1/29 - likely due to sepsis - cont BB and ARB - monitor weight closely and monitor clinically closely.  Currently no need for diuretics.  Filed Weights   02/01/19 0648 02/02/19 0257 02/03/19 0554  Weight: 90.8 kg 89.6 kg 120.2 kg    Dysphagia.  Due to tracheostomy.  Did receive PEG tube feeds currently speech therapy has advanced him to a dysphagia 2 diet which she is tolerating well along with protein supplements will continue to monitor.  Acute metabolic encephalopathy - resolved.  Deconditioning -stable will require placement.     Sacral decub - Per wound care - General Surgery consulted on 2/21> no need for surgical debridement.  DM2 -continue present Levemir and sliding scale dose Levemir dose has been reduced to 25 twice  daily  CBG (last 3)  Recent Labs    02/02/19 2230 02/03/19 0609 02/03/19 0827  GLUCAP 106* 150* 143*   Lab Results  Component Value Date   HGBA1C 7.3 (H) 01/21/2019     DVT prophylaxis: lovenox  Code Status: FULL CODE Family Communication: no family present at time of exam  Disposition Plan: stable for tele bed - cont trach care - complete abx tx - ready for d/c to SNF for rehab stay w/ ultimate plan for d/c home   Consultants:  PCCM Cardiology   Antimicrobials:  Ceftaz 2/20 >  Objective: Blood pressure 136/72, pulse (!) 110, temperature 98.7 F (37.1 C), temperature source Oral, resp. rate 18, height 6' (1.829 m), weight 120.2 kg, SpO2 (!) 89 %.  Intake/Output Summary (Last 24 hours) at 02/03/2019 1040 Last data filed at 02/03/2019 0402 Gross per 24 hour  Intake 240 ml  Output 1055 ml  Net -815 ml   Filed Weights   02/01/19 0648 02/02/19 0257 02/03/19 0554  Weight: 90.8 kg 89.6 kg 120.2 kg    Examination:  Awake Alert, Oriented X 3, No new F.N deficits, Normal affect [place].AT,PERRAL Supple Neck,No JVD, No cervical lymphadenopathy appriciated.  Symmetrical Chest wall movement, Good air movement bilaterally, CTAB RRR,No Gallops, Rubs or new Murmurs, No Parasternal Heave +ve B.Sounds, Abd Soft, No tenderness, No organomegaly appriciated, No rebound - guarding or rigidity. No Cyanosis, Clubbing or edema, Trach site appears clean with trach collar in place, PEG tube in place site clean, left BKA, right first toe ray amputation   CBC: Recent Labs  Lab 02/01/19 0835 02/03/19 0732  WBC 7.8 12.8*  HGB 11.7* 12.0*  HCT 37.3* 37.7*  MCV 87.1 85.7  PLT 165 160   Basic Metabolic Panel: Recent Labs  Lab 02/01/19 0835 02/03/19 0732  NA 136 133*  K 4.0 4.0  CL 96* 96*  CO2 29 28  GLUCOSE 97 136*  BUN 21* 21  CREATININE 0.73 0.71  CALCIUM 9.5 9.1   GFR: Estimated Creatinine Clearance: 129.7 mL/min (by C-G formula based on SCr of 0.71 mg/dL).  Liver  Function Tests: No results for input(s): AST, ALT, ALKPHOS, BILITOT, PROT, ALBUMIN in the last 168 hours.   HbA1C: Hgb A1c MFr Bld  Date/Time Value Ref Range Status  01/21/2019 04:01 AM 7.3 (H) 4.8 - 5.6 % Final    Comment:    (NOTE) Pre diabetes:          5.7%-6.4% Diabetes:              >6.4% Glycemic control for   <7.0% adults with diabetes   03/25/2017 05:59 AM 12.3 (H) 4.8 - 5.6 % Final    Comment:    (NOTE)         Pre-diabetes: 5.7 - 6.4         Diabetes: >6.4         Glycemic control for adults with diabetes: <7.0     CBG: Recent Labs  Lab 02/02/19 1154 02/02/19 1634 02/02/19 2230 02/03/19 0609 02/03/19 0827  GLUCAP 135* 202* 106* 150* 143*     Scheduled Meds: . arformoterol  15 mcg Nebulization BID  . aspirin  81 mg Per  Tube Daily  . atorvastatin  40 mg Per Tube q1800  . budesonide (PULMICORT) nebulizer solution  0.5 mg Nebulization BID  . carvedilol  3.125 mg Per Tube BID WC  . collagenase   Topical Daily  . enoxaparin (LOVENOX) injection  40 mg Subcutaneous Q24H  . famotidine  20 mg Per Tube BID  . feeding supplement (PRO-STAT SUGAR FREE 64)  30 mL Oral TID WC  . folic acid  1 mg Per Tube Daily  . guaiFENesin  5 mL Per Tube Q12H  . insulin aspart  0-20 Units Subcutaneous TID WC  . insulin detemir  25 Units Subcutaneous BID  . multivitamin  15 mL Per Tube Daily  . nutrition supplement (JUVEN)  1 packet Per Tube BID BM  . tapentadol  50 mg Oral QID  . thiamine  100 mg Per Tube Daily     LOS: 67 days   Signature  Susa Raring M.D on 02/03/2019 at 10:40 AM   -  To page go to [email or web address]         [place] TEAM 1 - Stepdown/ICU TEAM  Antonio [name].  ZOX:[id number] DOB: 07/24/1956 DOA: 11/28/2018 PCP: Dema Severin, NP    Brief Narrative:  63yo male with influenza A causing respiratory failure and cardiogenic shock. Prolonged mechanical ventilation, tracheostomy placed on January 16.  Significant Events:  1/6 self extubated   1/7 re-intubated  1/8 self extubated 1/9 acute respiratory failure, mucus plugging right lower lobe, re-intubated, bronchoscopy 1/10 New fever T Max 103.1, antibiotics resumed with ceftaz and vancomycin 1/11 Hemodynamically seems to be improving. Very hypernatremic. X-ray improving. Adding free water, one-time Lasix, follow-up chemistry a.m. Hoping to initiate weaning efforts on 1/12 1/16 Continuing diuresis and ongoing ventilatory support. Working on weaning but still has significant work of breathing. Plan has been to proceed with tracheostomy. Hopefully we can continue ongoing diuresis and continue to decrease sedation 1/20 still struggling to wean. Secretions and anxiety are barriers.  1/21 trach revision to 6 distal XLT, fever, increased secretions, abx broadened / bronch 1/22 ongoing thick secretions 1/24 Failed SBT, low grade fever, Per CXR persistent pleural effusions / basilar consolidation atelectasis vs pneumonia per bases 1/25 Mucus plugged with slow recovery after chest PT and Wound Care 2/3  first time weaned for greater than 15 minutes (previously failed due to agitation, secretions, tachypnea) 2/10 TRH assumed care 2/11 trach changed to a #5 cuffless per PCCM  2/13 trach changed to #4 shiley cuffless  2/13 afternoon - pulled out new trach and "disoldged" PEG - both reinserted/repositioned  2/13 PM > 2/14 AM acute hypoxic failure - emergent bedside bronch > trach obstructed - #6 cuffed trach placed > back to ICU on vent  2/14 trach changed to #5 cuffed 2/15 TRH resumed care - resp distress > back on vent  2/15  Failed ATC with increased WOB 2/16  Failed ATC with increased WOB  2/21 back on pressure support 2/24 back on TC  01/27/19 - 5  XLT cuffless trach  3/5/  - trach change to cuff less #4, trach capped on 01/30/2019 02/02/19 -decannulated   Subjective:  Patient in bed, appears comfortable, denies any headache, no fever, no chest pain or pressure, no shortness of breath  , no abdominal pain. No focal weakness.  Mild cough.   Assessment & Plan:  Acute hypoxic respiratory failure - Influenza A / Corynebacterium pneumonia, Pseudomonas bacteremia back in February under critical care service - was under the care  of ICU, required multiple intubations thereafter tracheostomy, much improved after he received a treatment of Mucomyst on 01/26/2019 followed by suctioning, also activity has been increased he has been encouraged to sit up in chair, repeat Mucomyst treatment on 01/27/2019.   Finished his IV ceftaz course on 01/28/2019.  Pulmonary followed him and his tracheostomy was finally removed on 02/02/2019.  Unfortunately on 02/03/2019 patient spiked a fever and looks like he has aspirated, will make him n.p.o. except medications, tube feedings will be resumed.  Will replace him on meropenem on 02/03/2019 and monitor.  Follow blood and sputum cultures.  Follow speech advice.   Acute systolic heart failure  -  no plans for cardiac cath per Cardiology - EF 20-25% via TTE 11/29/18, but improved to 60-65% on f/u 1/29 - likely due to sepsis - cont BB and ARB - monitor weight closely and monitor clinically closely.  Currently no need for diuretics.  Filed Weights   02/01/19 0648 02/02/19 0257 02/03/19 0554  Weight: 90.8 kg 89.6 kg 120.2 kg    Dysphagia.  Due to tracheostomy.  Was on PEG tube feeds, oral diet was resumed under speech therapy guidance on 01/31/2019.  Currently looks like he has reaspirated on 02/03/2019, n.p.o. except medications, PEG tube feeds, speech and dietitian consulted again.  Acute metabolic encephalopathy - resolved.  Deconditioning -stable will require placement.     Sacral decub - Per wound care - General Surgery consulted on 2/21> no need for surgical debridement.  DM2 - ISS -  currently on Lantus and sliding scale, since PEG tube feeds have been stopped and he has been placed on a dysphagia 2 diet will cut down high-dose Lantus slightly and monitor  cautiously.  CBG (last 3)  Recent Labs    02/02/19 2230 02/03/19 0609 02/03/19 0827  GLUCAP 106* 150* 143*   Lab Results  Component Value Date   HGBA1C 7.3 (H) 01/21/2019     DVT prophylaxis: lovenox  Code Status: FULL CODE Family Communication: no family present at time of exam  Disposition Plan: stable for tele bed - cont trach care - complete abx tx - ready for d/c to SNF for rehab stay w/ ultimate plan for d/c home   Consultants:  PCCM Cardiology   Antimicrobials:  Ceftaz 2/20 >  Objective: Blood pressure 136/72, pulse (!) 110, temperature 98.7 F (37.1 C), temperature source Oral, resp. rate 18, height 6' (1.829 m), weight 120.2 kg, SpO2 (!) 89 %.  Intake/Output Summary (Last 24 hours) at 02/03/2019 1040 Last data filed at 02/03/2019 0402 Gross per 24 hour  Intake 240 ml  Output 1055 ml  Net -815 ml   Filed Weights   02/01/19 0648 02/02/19 0257 02/03/19 0554  Weight: 90.8 kg 89.6 kg 120.2 kg    Examination:  Awake Alert, Oriented X 3, No new F.N deficits, Normal affect [place].AT,PERRAL Supple Neck,No JVD, No cervical lymphadenopathy appriciated.  Symmetrical Chest wall movement, Good air movement bilaterally, few RLL rales RRR,No Gallops, Rubs or new Murmurs, No Parasternal Heave +ve B.Sounds, Abd Soft, No tenderness, No organomegaly appriciated, No rebound - guarding or rigidity. No Cyanosis, Clubbing or edema, No new Rash or bruise Trach site appears clean Trach now capped, PEG tube in place site clean, left BKA, right first toe ray amputation   CBC: Recent Labs  Lab 02/01/19 0835 02/03/19 0732  WBC 7.8 12.8*  HGB 11.7* 12.0*  HCT 37.3* 37.7*  MCV 87.1 85.7  PLT 165 160   Basic Metabolic Panel:  Recent Labs  Lab 02/01/19 0835 02/03/19 0732  NA 136 133*  K 4.0 4.0  CL 96* 96*  CO2 29 28  GLUCOSE 97 136*  BUN 21* 21  CREATININE 0.73 0.71  CALCIUM 9.5 9.1   GFR: Estimated Creatinine Clearance: 129.7 mL/min (by C-G formula based on SCr of  0.71 mg/dL).  Liver Function Tests: No results for input(s): AST, ALT, ALKPHOS, BILITOT, PROT, ALBUMIN in the last 168 hours.   HbA1C: Hgb A1c MFr Bld  Date/Time Value Ref Range Status  01/21/2019 04:01 AM 7.3 (H) 4.8 - 5.6 % Final    Comment:    (NOTE) Pre diabetes:          5.7%-6.4% Diabetes:              >6.4% Glycemic control for   <7.0% adults with diabetes   03/25/2017 05:59 AM 12.3 (H) 4.8 - 5.6 % Final    Comment:    (NOTE)         Pre-diabetes: 5.7 - 6.4         Diabetes: >6.4         Glycemic control for adults with diabetes: <7.0     CBG: Recent Labs  Lab 02/02/19 1154 02/02/19 1634 02/02/19 2230 02/03/19 0609 02/03/19 0827  GLUCAP 135* 202* 106* 150* 143*     Scheduled Meds: . arformoterol  15 mcg Nebulization BID  . aspirin  81 mg Per Tube Daily  . atorvastatin  40 mg Per Tube q1800  . budesonide (PULMICORT) nebulizer solution  0.5 mg Nebulization BID  . carvedilol  3.125 mg Per Tube BID WC  . collagenase   Topical Daily  . enoxaparin (LOVENOX) injection  40 mg Subcutaneous Q24H  . famotidine  20 mg Per Tube BID  . feeding supplement (PRO-STAT SUGAR FREE 64)  30 mL Oral TID WC  . folic acid  1 mg Per Tube Daily  . guaiFENesin  5 mL Per Tube Q12H  . insulin aspart  0-20 Units Subcutaneous TID WC  . insulin detemir  25 Units Subcutaneous BID  . multivitamin  15 mL Per Tube Daily  . nutrition supplement (JUVEN)  1 packet Per Tube BID BM  . tapentadol  50 mg Oral QID  . thiamine  100 mg Per Tube Daily     LOS: 67 days   Signature  Susa Raring M.D on 02/03/2019 at 10:40 AM   -  To page go to [email or web address]

## 2019-02-03 NOTE — Progress Notes (Signed)
  Speech Language Pathology  Patient Details Name: Antonio [name]. MRN: [id number] DOB: [birth year] Today's Date: 02/03/2019 Time:  -     ST has been following pt for dysphagia and has progressed nicely. Pt decannulated yesterday and plan is to repeat MBS when stoma adequately closed. Will plan on MBS tomorrow morning in case he has a SNF bed offer tomorrow.      Breck Coons [place].Ed Nurse, children's [phone] Office [phone]

## 2019-02-04 ENCOUNTER — Inpatient Hospital Stay (HOSPITAL_COMMUNITY): Payer: Medicaid Other

## 2019-02-04 ENCOUNTER — Encounter (HOSPITAL_COMMUNITY): Payer: Self-pay | Admitting: Radiology

## 2019-02-04 DIAGNOSIS — L89304 Pressure ulcer of unspecified buttock, stage 4: Secondary | ICD-10-CM

## 2019-02-04 LAB — COMPREHENSIVE METABOLIC PANEL
ALBUMIN: 2.5 g/dL — AB (ref 3.5–5.0)
ALT: 14 U/L (ref 0–44)
AST: 11 U/L — ABNORMAL LOW (ref 15–41)
Alkaline Phosphatase: 77 U/L (ref 38–126)
Anion gap: 9 (ref 5–15)
BILIRUBIN TOTAL: 0.6 mg/dL (ref 0.3–1.2)
BUN: 31 mg/dL — ABNORMAL HIGH (ref 8–23)
CO2: 28 mmol/L (ref 22–32)
Calcium: 8.6 mg/dL — ABNORMAL LOW (ref 8.9–10.3)
Chloride: 95 mmol/L — ABNORMAL LOW (ref 98–111)
Creatinine, Ser: 0.67 mg/dL (ref 0.61–1.24)
GFR calc Af Amer: >60 mL/min (ref >60)
GFR calc non Af Amer: >60 mL/min (ref >60)
Glucose, Bld: 189 mg/dL — ABNORMAL HIGH (ref 70–99)
POTASSIUM: 3.8 mmol/L (ref 3.5–5.1)
Sodium: 132 mmol/L — ABNORMAL LOW (ref 135–145)
TOTAL PROTEIN: 6.6 g/dL (ref 6.5–8.1)

## 2019-02-04 LAB — URINALYSIS, ROUTINE W REFLEX MICROSCOPIC
Bilirubin Urine: NEGATIVE
Glucose, UA: 150 mg/dL — AB
Hgb urine dipstick: NEGATIVE
Ketones, ur: NEGATIVE mg/dL
Leukocytes,Ua: NEGATIVE
Nitrite: NEGATIVE
Protein, ur: 100 mg/dL — AB
SPECIFIC GRAVITY, URINE: 1.028 (ref 1.005–1.030)
pH: 5 (ref 5.0–8.0)

## 2019-02-04 LAB — EXPECTORATED SPUTUM ASSESSMENT W GRAM STAIN, RFLX TO RESP C

## 2019-02-04 LAB — CBC WITH DIFFERENTIAL/PLATELET
Abs Immature Granulocytes: 0.04 10*3/uL (ref 0.00–0.07)
Basophils Absolute: 0 10*3/uL (ref 0.0–0.1)
Basophils Relative: 0 %
Eosinophils Absolute: 0.1 10*3/uL (ref 0.0–0.5)
Eosinophils Relative: 1 %
HCT: 37.2 % — ABNORMAL LOW (ref 39.0–52.0)
Hemoglobin: 11.8 g/dL — ABNORMAL LOW (ref 13.0–17.0)
Immature Granulocytes: 0 %
LYMPHS PCT: 13 %
Lymphs Abs: 1.6 10*3/uL (ref 0.7–4.0)
MCH: 27.4 pg (ref 26.0–34.0)
MCHC: 31.7 g/dL (ref 30.0–36.0)
MCV: 86.5 fL (ref 80.0–100.0)
Monocytes Absolute: 0.8 10*3/uL (ref 0.1–1.0)
Monocytes Relative: 7 %
Neutro Abs: 9.7 10*3/uL — ABNORMAL HIGH (ref 1.7–7.7)
Neutrophils Relative %: 79 %
Platelets: 171 10*3/uL (ref 150–400)
RBC: 4.3 MIL/uL (ref 4.22–5.81)
RDW: 16.5 % — ABNORMAL HIGH (ref 11.5–15.5)
WBC: 12.3 10*3/uL — ABNORMAL HIGH (ref 4.0–10.5)
nRBC: 0 % (ref 0.0–0.2)

## 2019-02-04 LAB — GLUCOSE, CAPILLARY
Glucose-Capillary: 192 mg/dL — ABNORMAL HIGH (ref 70–99)
Glucose-Capillary: 179 mg/dL — ABNORMAL HIGH (ref 70–99)
Glucose-Capillary: 179 mg/dL — ABNORMAL HIGH (ref 70–99)
Glucose-Capillary: 195 mg/dL — ABNORMAL HIGH (ref 70–99)

## 2019-02-04 LAB — PROCALCITONIN: PROCALCITONIN: 0.26 ng/mL

## 2019-02-04 MED ORDER — METRONIDAZOLE IN NACL 5-0.79 MG/ML-% IV SOLN
500.0000 mg | Freq: Three times a day (TID) | INTRAVENOUS | Status: DC
  Administered 2019-02-04 – 2019-02-06 (×6): 500 mg via INTRAVENOUS
  Filled 2019-02-04 (×6): qty 100

## 2019-02-04 MED ORDER — SODIUM CHLORIDE 0.9 % IV SOLN
2.0000 g | Freq: Three times a day (TID) | INTRAVENOUS | Status: AC
  Administered 2019-02-04 – 2019-02-08 (×13): 2 g via INTRAVENOUS
  Filled 2019-02-04 (×14): qty 2

## 2019-02-04 MED ORDER — GADOBUTROL 1 MMOL/ML IV SOLN
7.0000 mL | Freq: Once | INTRAVENOUS | Status: AC | PRN
  Administered 2019-02-04: 7 mL via INTRAVENOUS

## 2019-02-04 MED ORDER — VANCOMYCIN HCL 10 G IV SOLR
1250.0000 mg | Freq: Two times a day (BID) | INTRAVENOUS | Status: DC
  Administered 2019-02-04 – 2019-02-06 (×4): 1250 mg via INTRAVENOUS
  Filled 2019-02-04 (×5): qty 1250

## 2019-02-04 NOTE — Progress Notes (Signed)
CSW spoke with patient's son regarding rehab choice. CSW presented options and patient's son selected [facility]. CSW will reach out to them to make sure they have a bed available at discharge.   Osborne Casco Shirley Bolle LCSW [phone]

## 2019-02-04 NOTE — Progress Notes (Signed)
Dr. Craige Cotta notified of patient having a temp of 102.8. Pt was given tylenol and Ice packs. No new orders at this time

## 2019-02-04 NOTE — Progress Notes (Signed)
Dr. Craige Cotta paged about patient having a temp of 102.8. Pt given tylenol and ice pack and Temp in now 99.6

## 2019-02-04 NOTE — Consult Note (Signed)
WOC Nurse wound follow up Patient receiving care in [facility] [id number].  Male and male visitor present. PT in for hydrotherapy. 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 Please see PTs hydrotherapy notes for complete wound details.  From a wound therapy perspective, the wound is deep, has areas of necrosis, there is periwound erythema, and there is a significant amount of pain to the left buttock during movement of the buttock to view the wound bed.  As best I can tell there is tunneling along the left gluteal muscle.  I have text paged Dr. Glade Lloyd and requested a surgical consult.  Also, I recommend imaging studies of the area to examine for osteomyelitis. Helmut Muster, RN, MSN, CWOCN, CNS-BC, pager [phone]

## 2019-02-04 NOTE — Progress Notes (Addendum)
[facility] Surgery Consult Note  Antonio [name] [place]. Nov 18, 1956  [id number].    Requesting MD: Dr. Glade Lloyd Chief Complaint/Reason for Consult: Sacral Wound   HPI: Antonio [name]. is a 63 y.o. male with a history of CAD, IDDM, HTN, HLD, L BKA who was admitted on 11/28/2018 with influenza A causing respiratory failure and cardiogenic shock.  Patient had prolonged mechanical ventilation and a tracheostomy was placed on January 16.  During hospital stay the patient developed HCAP with respiratory cultures growing Pseudomonas.  He was treated for this with IV antibiotics.  Patient had his tracheostomy removed on 3/9.  Patient reports that he aspirated later that day.  He is now febrile and has an elevated white count.  He is currently on broad-spectrum antibiotics including cefepime, vancomycin and Flagyl.  He has urine, blood and respiratory cultures pending.   The patient apparently had a boil that was incised and drained in his sacral region on 1/2.  Since that time the patient has developed a sacral wound that is been worsening.  General surgery was consulted on 2/21 for this and there was no evidence of infection or necrosis.  Was recommended to continue hydrotherapy.  Patient has been receiving hydrotherapy as well as Santyl wound debridement. He reports this area is constantly painful and worse with palpation. His pain is improved with IV pain medication. Fevers as noted above. No chills, abdominal pain or N/V.    Last meal - NPO for 2 days PMH significant for as above Abdominal surgical history: Appendectomy Anticoagulants: On prophylactic Lovenox Tbcc: 70 pack year history Alc:  Occasional  No illicit drug use  ROS: Review of Systems  All other systems reviewed and are negative.   All systems reviewed and otherwise negative except for as above  Family History  Problem Relation Age of Onset   Diabetes Mother    Emphysema Father    CAD Sister 76       Died of  MI after flu    Past Medical History:  Diagnosis Date   Coronary artery disease    Diabetes mellitus without complication (HCC)    Hyperlipidemia    Hypertension    Osteomyelitis (HCC) 03/25/2017   RT FOOT    Past Surgical History:  Procedure Laterality Date   AMPUTATION Right 03/27/2017   Procedure: 1st Ray Amputation Right Foot;  Surgeon: Nadara Mustard, MD;  Location: [facility] OR;  Service: Orthopedics;  Laterality: Right;   APPENDECTOMY     BELOW KNEE LEG AMPUTATION Left    CARDIAC CATHETERIZATION     CORONARY STENT INTERVENTION  2005   Great toe amputation right.     I&D EXTREMITY Left 12/30/2015   Procedure: IRRIGATION AND DEBRIDEMENT LEFT FOOT, TRANSMETATARSAL AMPUTATION WITH APPLICATION OF ANTIBIOTIC BEADS AND WOUND VAC;  Surgeon: Nadara Mustard, MD;  Location: [facility] OR;  Service: Orthopedics;  Laterality: Left;   IR GASTROSTOMY TUBE MOD SED  12/23/2018   TONSILLECTOMY     TRACHEOSTOMY  11/2018    Social History:  reports that he quit smoking about 2 months ago. His smoking use included cigarettes. He has a 70.00 pack-year smoking history. He has never used smokeless tobacco. He reports current alcohol use. He reports that he does not use drugs.  Allergies:  Allergies  Allergen Reactions   Iodinated Diagnostic Agents Hives, Rash and Other (See Comments)    Blisters Staph Blisters / staph   Penicillins Other (See Comments)    UNSPECIFIED REACTION  Has patient had a PCN reaction causing immediate rash, facial/tongue/throat swelling, SOB or lightheadedness with hypotension: No Has patient had a PCN reaction causing severe rash involving mucus membranes or skin necrosis: No Has patient had a PCN reaction that required hospitalization: No Has patient had a PCN reaction occurring within the last 10 years: No If all of the above answers are "NO", then may proceed with Cephalosporin use.    Medications Prior to Admission  Medication Sig Dispense Refill   aspirin EC  81 MG tablet Take 81 mg by mouth daily.     atorvastatin (LIPITOR) 40 MG tablet Take 1 tablet (40 mg total) by mouth daily at 6 PM. 30 tablet 0   glipiZIDE (GLUCOTROL) 10 MG tablet Take 10 mg by mouth 2 (two) times daily before a meal.     Insulin Degludec (TRESIBA) 100 UNIT/ML SOLN Inject 70 Units into the skin at bedtime.      insulin glargine (LANTUS) 100 UNIT/ML injection Inject 0.05 mLs (5 Units total) into the skin at bedtime. 10 mL 11   insulin regular (NOVOLIN R,HUMULIN R) 100 units/mL injection Inject 2-4 Units into the skin as needed for high blood sugar. Only of over 150 mg     lisinopril (PRINIVIL,ZESTRIL) 5 MG tablet Take 5 mg by mouth 2 (two) times daily.     metoprolol tartrate (LOPRESSOR) 25 MG tablet Take 25 mg by mouth 2 (two) times daily.     Multiple Vitamin (MULTIVITAMIN) tablet Take 1 tablet by mouth daily.     tapentadol HCl (NUCYNTA) 75 MG tablet Take 75 mg by mouth 4 (four) times daily.       Prior to Admission medications   Medication Sig Start Date End Date Taking? Authorizing Provider  aspirin EC 81 MG tablet Take 81 mg by mouth daily.   Yes [provider]  atorvastatin (LIPITOR) 40 MG tablet Take 1 tablet (40 mg total) by mouth daily at 6 PM. 03/28/17  Yes Howard Pouch, MD  glipiZIDE (GLUCOTROL) 10 MG tablet Take 10 mg by mouth 2 (two) times daily before a meal.   Yes [provider]  Insulin Degludec (TRESIBA) 100 UNIT/ML SOLN Inject 70 Units into the skin at bedtime.    Yes [provider]  insulin glargine (LANTUS) 100 UNIT/ML injection Inject 0.05 mLs (5 Units total) into the skin at bedtime. 03/28/17  Yes Howard Pouch, MD  insulin regular (NOVOLIN R,HUMULIN R) 100 units/mL injection Inject 2-4 Units into the skin as needed for high blood sugar. Only of over 150 mg   Yes [provider]  lisinopril (PRINIVIL,ZESTRIL) 5 MG tablet Take 5 mg by mouth 2 (two) times daily.   Yes [provider]  metoprolol tartrate  (LOPRESSOR) 25 MG tablet Take 25 mg by mouth 2 (two) times daily.   Yes [provider]  Multiple Vitamin (MULTIVITAMIN) tablet Take 1 tablet by mouth daily.   Yes [provider]  tapentadol HCl (NUCYNTA) 75 MG tablet Take 75 mg by mouth 4 (four) times daily.    Yes [provider]    Blood pressure (!) 103/56, pulse 79, temperature 99.2 F (37.3 C), temperature source Oral, resp. rate 18, height 6' (1.829 m), weight 81.2 kg, SpO2 98 %. Physical Exam: General: ill appearing, WD/WN white male who is lying in bed  HEENT: head is normocephalic, atraumatic.  Sclera are noninjected.  Pupils equal and round.  Ears and nose without any masses or lesions.  Mouth is dry.  Dentition fair. Dressing over neck from prior trach site.  Heart: Tachycardic in the low 100's while I was in the room. Normal rhythm.  No obvious murmurs, gallops, or rubs noted.   Lungs: Decreased breath sounds b/l. Some rales on the bases, R>L. Tachypnic Abd: Soft, NT/ND, +BS, no masses, hernias, or organomegaly MS: Left BKA. Right toe amputation. No edema. GU: Sacral wound as noted in picture below. From points of greatest diameter this measures 9cm x 7cm. There is a 2cm x 3cm opening superiorly that tracts 3cm deep with palpable likely bone as seen in the picture below. There is a 2cm edge that appears to have necrotic tissue superiorly. There is no purulence. The area is tender to touch.  Skin: warm and dry with no masses, lesions, or rashes besides as noted above.  Psych: A&Ox3 with an appropriate affect. Neuro: cranial nerves grossly intact, extremity CSM intact bilaterally, normal speech      Results for orders placed or performed during the hospital encounter of 11/28/18 (from the past 48 hour(s))  Glucose, capillary     Status: Abnormal   Collection Time: 02/02/19  4:34 PM  Result Value Ref Range   Glucose-Capillary 202 (H) 70 - 99 mg/dL  Glucose, capillary     Status: Abnormal   Collection  Time: 02/02/19 10:30 PM  Result Value Ref Range   Glucose-Capillary 106 (H) 70 - 99 mg/dL  Glucose, capillary     Status: Abnormal   Collection Time: 02/03/19  6:09 AM  Result Value Ref Range   Glucose-Capillary 150 (H) 70 - 99 mg/dL  CBC     Status: Abnormal   Collection Time: 02/03/19  7:32 AM  Result Value Ref Range   WBC 12.8 (H) 4.0 - 10.5 K/uL   RBC 4.40 4.22 - 5.81 MIL/uL   Hemoglobin 12.0 (L) 13.0 - 17.0 g/dL   HCT 16.1 (L) 09.6 - 04.5 %   MCV 85.7 80.0 - 100.0 fL   MCH 27.3 26.0 - 34.0 pg   MCHC 31.8 30.0 - 36.0 g/dL   RDW 40.9 (H) 81.1 - 91.4 %   Platelets 160 150 - 400 K/uL   nRBC 0.0 0.0 - 0.2 %    Comment: Performed at [facility], [address], [place], [place] [address]  Basic metabolic panel     Status: Abnormal   Collection Time: 02/03/19  7:32 AM  Result Value Ref Range   Sodium 133 (L) 135 - 145 mmol/L   Potassium 4.0 3.5 - 5.1 mmol/L   Chloride 96 (L) 98 - 111 mmol/L   CO2 28 22 - 32 mmol/L   Glucose, Bld 136 (H) 70 - 99 mg/dL   BUN 21 8 - 23 mg/dL   Creatinine, Ser 6.21 0.61 - 1.24 mg/dL   Calcium 9.1 8.9 - 30.8 mg/dL   GFR calc non Af Amer >60 >60 mL/min   GFR calc Af Amer >60 >60 mL/min   Anion gap 9 5 - 15    Comment: Performed at [facility], [address], [place], [place] [address]  Glucose, capillary     Status: Abnormal   Collection Time: 02/03/19  8:27 AM  Result Value Ref Range   Glucose-Capillary 143 (H) 70 - 99 mg/dL  Glucose, capillary     Status: Abnormal   Collection Time: 02/03/19 12:49 PM  Result Value Ref Range   Glucose-Capillary 178 (H) 70 - 99 mg/dL  Glucose, capillary     Status:  Abnormal   Collection Time: 02/03/19  4:46 PM  Result Value Ref Range   Glucose-Capillary 166 (H) 70 - 99 mg/dL  Glucose, capillary     Status: Abnormal   Collection Time: 02/03/19 10:03 PM  Result Value Ref Range   Glucose-Capillary 217 (H) 70 - 99 mg/dL  Glucose, capillary     Status: Abnormal   Collection Time: 02/04/19  8:06  AM  Result Value Ref Range   Glucose-Capillary 192 (H) 70 - 99 mg/dL  CBC with Differential/Platelet     Status: Abnormal   Collection Time: 02/04/19 12:01 PM  Result Value Ref Range   WBC 12.3 (H) 4.0 - 10.5 K/uL   RBC 4.30 4.22 - 5.81 MIL/uL   Hemoglobin 11.8 (L) 13.0 - 17.0 g/dL   HCT 15.0 (L) 56.9 - 79.4 %   MCV 86.5 80.0 - 100.0 fL   MCH 27.4 26.0 - 34.0 pg   MCHC 31.7 30.0 - 36.0 g/dL   RDW 80.1 (H) 65.5 - 37.4 %   Platelets 171 150 - 400 K/uL   nRBC 0.0 0.0 - 0.2 %   Neutrophils Relative % 79 %   Neutro Abs 9.7 (H) 1.7 - 7.7 K/uL   Lymphocytes Relative 13 %   Lymphs Abs 1.6 0.7 - 4.0 K/uL   Monocytes Relative 7 %   Monocytes Absolute 0.8 0.1 - 1.0 K/uL   Eosinophils Relative 1 %   Eosinophils Absolute 0.1 0.0 - 0.5 K/uL   Basophils Relative 0 %   Basophils Absolute 0.0 0.0 - 0.1 K/uL   Immature Granulocytes 0 %   Abs Immature Granulocytes 0.04 0.00 - 0.07 K/uL    Comment: Performed at [facility], [address] [place], [place] [address]   Dg Chest Port 1 View  Result Date: 02/04/2019 CLINICAL DATA:  Dyspnea. EXAM: PORTABLE CHEST 1 VIEW COMPARISON:  One-view chest x-ray 02/03/2019 FINDINGS: The heart is mildly enlarged. Mild pulmonary vascular congestion is stable. Elevation of the right hemidiaphragm is again noted. There is some volume loss on the right with associated atelectasis. No significant airspace consolidation is present. A small right effusion is not excluded. IMPRESSION: 1. Mild pulmonary vascular congestion without frank edema. 2. Similar appearance of right basilar atelectasis. Electronically Signed   By: Marin Roberts M.D.   On: 02/04/2019 08:31   Dg Chest Port 1 View  Result Date: 02/03/2019 CLINICAL DATA:  Shortness of breath EXAM: PORTABLE CHEST 1 VIEW COMPARISON:  01/19/2019 FINDINGS: Cardiac shadows within normal limits. Elevation of the right hemidiaphragm is noted with right basilar atelectasis. No other focal infiltrate is seen.  Gastrostomy tube is noted within the stomach. IMPRESSION: Mild right basilar atelectasis. Electronically Signed   By: Alcide Clever M.D.   On: 02/03/2019 08:24   Dg Swallowing Func-speech Pathology  Result Date: 02/04/2019 Objective Swallowing Evaluation: Type of Study: MBS-Modified Barium Swallow Study  Patient Details Name: Antonio [name]. MRN: [id number] Date of Birth: [birth year] Today's Date: 02/04/2019 Time: SLP Start Time (ACUTE ONLY): 0920 -SLP Stop Time (ACUTE ONLY): 0931 SLP Time Calculation (min) (ACUTE ONLY): 11 min Past Medical History: Past Medical History: Diagnosis Date  Coronary artery disease   Diabetes mellitus without complication (HCC)   Hyperlipidemia   Hypertension   Osteomyelitis (HCC) 03/25/2017  RT FOOT Past Surgical History: Past Surgical History: Procedure Laterality Date  AMPUTATION Right 03/27/2017  Procedure: 1st Ray Amputation Right Foot;  Surgeon: Nadara Mustard, MD;  Location: [facility] OR;  Service: Orthopedics;  Laterality: Right;  APPENDECTOMY    BELOW KNEE LEG AMPUTATION Left   CARDIAC CATHETERIZATION    CORONARY STENT INTERVENTION  2005  Great toe amputation right.    I&D EXTREMITY Left 12/30/2015  Procedure: IRRIGATION AND DEBRIDEMENT LEFT FOOT, TRANSMETATARSAL AMPUTATION WITH APPLICATION OF ANTIBIOTIC BEADS AND WOUND VAC;  Surgeon: Nadara Mustard, MD;  Location: [facility] OR;  Service: Orthopedics;  Laterality: Left;  IR GASTROSTOMY TUBE MOD SED  12/23/2018  TONSILLECTOMY    TRACHEOSTOMY  11/2018 HPI: 63 year old male coming in with fever cough chest pain and shortness of breath was found to have influenza A. PMH: diabetes mellitus, hypertension.  Intubated multiple times 1/3 to 1/16 with trach placed 1/16. Repeat MBS on 3/28 for possible initiation of po's. This is pt's 3rd MBS. Plan to repeat to determine upgrade initially; on 3/10 concern pt may have aspirated and made NPO.   Subjective: pt is alert and cooperative but needs encouragement Assessment / Plan / Recommendation  CHL IP CLINICAL IMPRESSIONS 02/04/2019 Clinical Impression Pt has been decannulated since 3/9 with evidence of incomplete stoma closure indicated by breathy vocal quality. He continues to exhibit moderate-severe pharyngeal dysphagia noteably due to incompetent laryngeal closure and poor retroflexion of epiglottis. Honey thick penetrated vestibule with initial swallow of MBS and expelled with cues for cough and was not penetrated with subsequent trial. Nectar thick entered vestibule with penetration aspiration score (PAS) of 3, 5 & 7 (did not exit trachea with cough). Chin tuck actually facilitated entrance of barium into trachea and swallow with head in neutral applying pressure to gauze/stoma to increase pressure was ineffective. Mild-mod amount of vallecular and pyriform sinus residue not significantly improved. Recommend pt resume po's of honey thick liquids, texture of Dys 2, multiple swallows and hard coughs/throat clear after every other bite/sip. Pt should continue to receive ST at skilled nursing facility for continued intervention and repeat MBS when appropriate (not before 6-8 weeks). Will continue to follow while here.  SLP Visit Diagnosis Dysphagia, pharyngeal phase (R13.13) Attention and concentration deficit following -- Frontal lobe and executive function deficit following -- Impact on safety and function Severe aspiration risk   CHL IP TREATMENT RECOMMENDATION 02/04/2019 Treatment Recommendations Therapy as outlined in treatment plan below   Prognosis 02/04/2019 Prognosis for Safe Diet Advancement Good Barriers to Reach Goals -- Barriers/Prognosis Comment -- CHL IP DIET RECOMMENDATION 02/04/2019 SLP Diet Recommendations Dysphagia 2 (Fine chop) solids;Honey thick liquids Liquid Administration via Cup;No straw Medication Administration Whole meds with puree Compensations Slow rate;Small sips/bites;Multiple dry swallows after each bite/sip;Hard cough after swallow;Clear throat intermittently Postural  Changes Seated upright at 90 degrees;Remain semi-upright after after feeds/meals (Comment)   CHL IP OTHER RECOMMENDATIONS 02/04/2019 Recommended Consults -- Oral Care Recommendations Oral care BID Other Recommendations --   CHL IP FOLLOW UP RECOMMENDATIONS 02/04/2019 Follow up Recommendations Skilled Nursing facility   [facility] IP FREQUENCY AND DURATION 02/04/2019 Speech Therapy Frequency (ACUTE ONLY) min 2x/week Treatment Duration 2 weeks      CHL IP ORAL PHASE 02/04/2019 Oral Phase WFL Oral - Pudding Teaspoon -- Oral - Pudding Cup -- Oral - Honey Teaspoon -- Oral - Honey Cup WFL Oral - Nectar Teaspoon -- Oral - Nectar Cup WFL Oral - Nectar Straw -- Oral - Thin Teaspoon -- Oral - Thin Cup NT Oral - Thin Straw -- Oral - Puree NT Oral - Mech Soft -- Oral - Regular NT Oral - Multi-Consistency -- Oral - Pill -- Oral Phase - Comment --  CHL IP PHARYNGEAL PHASE  02/04/2019 Pharyngeal Phase Impaired Pharyngeal- Pudding Teaspoon -- Pharyngeal -- Pharyngeal- Pudding Cup -- Pharyngeal -- Pharyngeal- Honey Teaspoon NT Pharyngeal -- Pharyngeal- Honey Cup Penetration/Aspiration during swallow;Pharyngeal residue - valleculae;Pharyngeal residue - pyriform;Reduced airway/laryngeal closure Pharyngeal Material enters airway, remains ABOVE vocal cords and not ejected out Pharyngeal- Nectar Teaspoon NT Pharyngeal -- Pharyngeal- Nectar Cup Penetration/Aspiration during swallow;Reduced airway/laryngeal closure;Pharyngeal residue - valleculae;Pharyngeal residue - pyriform Pharyngeal Material enters airway, passes BELOW cords and not ejected out despite cough attempt by patient;Material enters airway, CONTACTS cords and not ejected out Pharyngeal- Nectar Straw -- Pharyngeal -- Pharyngeal- Thin Teaspoon NT Pharyngeal -- Pharyngeal- Thin Cup NT Pharyngeal -- Pharyngeal- Thin Straw -- Pharyngeal -- Pharyngeal- Puree NT Pharyngeal -- Pharyngeal- Mechanical Soft -- Pharyngeal -- Pharyngeal- Regular NT Pharyngeal -- Pharyngeal- Multi-consistency --  Pharyngeal -- Pharyngeal- Pill -- Pharyngeal -- Pharyngeal Comment --  CHL IP CERVICAL ESOPHAGEAL PHASE 02/04/2019 Cervical Esophageal Phase WFL Pudding Teaspoon -- Pudding Cup -- Honey Teaspoon -- Honey Cup -- Nectar Teaspoon -- Nectar Cup -- Nectar Straw -- Thin Teaspoon -- Thin Cup -- Thin Straw -- Puree -- Mechanical Soft -- Regular -- Multi-consistency -- Pill -- Cervical Esophageal Comment -- Royce Macadamia 02/04/2019, 10:23 AM Breck Coons [employer] administrator Pager [phone] Office [phone]                 Assessment/Plan Sacral Wound - This is a 63 year old male with an extensive hospital stay following influenza, tracheostomy, HCAP.The patient had a recent recent aspiration and is now developed fevers and is being covered with broad-spectrum antibiotics.  We are consulted for a sacral wound.  It appears the patient had an I&D of a sacral boil on 1/2. This has developed into a wound since being in the hospital. He is currently been treated for this with hydrotherapy, Santyl and dressing changes.  The area appears to have a area of necrosis at the superior edge of an opening.  This does tunnel approximately 3 cm deep and there appears to be palpable bone.  Primary team can consider a MRI/CT to evaluate for osteo-myelitis.  Wound otherwise appears clean.  Given his medical condition and I feel the patient is a operative candidate.  Would recommend increasing hydrotherapy to daily, continue Santyl debridement and dressing changes.  I reviewed this with my attending   Jacinto Halim, [facility] 02/04/2019, 12:36 PM Pager: [phone]

## 2019-02-04 NOTE — Progress Notes (Signed)
Occupational Therapy Treatment Patient Details Name: Antonio [name]. MRN: [id number] DOB: 10/01/1956 Today's Date: 02/04/2019    History of present illness Pt is a 63 y.o. male admitted 11/28/18 with flu A and PNA; acute respiratory failure intubated 1/4. Trach placed 1/16. PEG placed 1/28. Hospital course complicared by acute MI, CHF, cardiogenic shock, and mucous plugging. Also with sacral wound. PMH includes DM, HTN, HLD, CAD, L BKA, R transmet amputation.   OT comments  Pt more concerned about surgeon's recommendations for his sacral wound treatment than OT today. Agreeable only to grooming at bed level. Pt self feeding. Will continue to follow.  Follow Up Recommendations  SNF;Supervision/Assistance - 24 hour    Equipment Recommendations       Recommendations for Other Services      Precautions / Restrictions Precautions Precautions: Fall Precaution Comments: PEG, decannulated Required Braces or Orthoses: Other Brace Other Brace: L LE prosthesis       Mobility Bed Mobility                  Transfers                      Balance                                           ADL either performed or assessed with clinical judgement   ADL   Eating/Feeding: Set up;Bed level Eating/Feeding Details (indicate cue type and reason): pt on combination of oral and PEG feedings Grooming: Wash/dry hands;Wash/dry face;Oral care;Bed level;Set up Grooming Details (indicate cue type and reason): pt declined sitting EOB                                     Vision       Perception     Praxis      Cognition Arousal/Alertness: Awake/alert Behavior During Therapy: WFL for tasks assessed/performed   Area of Impairment: Problem solving                               General Comments: pt self limiting with poor insight into skills he needs to progress home        Exercises     Shoulder Instructions        General Comments      Pertinent Vitals/ Pain       Pain Assessment: No/denies pain  Home Living                                          Prior Functioning/Environment              Frequency  Min 2X/week        Progress Toward Goals  OT Goals(current goals can now be found in the care plan section)  Progress towards OT goals: Not progressing toward goals - comment(pt self limiting)  Acute Rehab OT Goals Patient Stated Goal: to be home by his birthday OT Goal Formulation: With patient Time For Goal Achievement: 02/11/19 Potential to Achieve Goals: Good  Plan Discharge plan remains appropriate    Co-evaluation  AM-PAC OT "6 Clicks" Daily Activity     Outcome Measure   Help from another person eating meals?: None Help from another person taking care of personal grooming?: None Help from another person toileting, which includes using toliet, bedpan, or urinal?: Total Help from another person bathing (including washing, rinsing, drying)?: A Lot Help from another person to put on and taking off regular upper body clothing?: A Little Help from another person to put on and taking off regular lower body clothing?: A Lot 6 Click Score: 16    End of Session    OT Visit Diagnosis: Unsteadiness on feet (R26.81);Other abnormalities of gait and mobility (R26.89);Muscle weakness (generalized) (M62.81);Other symptoms and signs involving cognitive function   Activity Tolerance Other (comment)(pt declining OOB)   Patient Left in bed;with call bell/phone within reach;with bed alarm set   Nurse Communication          Time: 3361-2244 OT Time Calculation (min): 12 min  Charges: OT General Charges $OT Visit: 1 Visit OT Treatments $Self Care/Home Management : 8-22 mins  Martie Round, OTR/L Acute Rehabilitation Services Pager: [phone] Office: [phone]   Evern Bio 02/04/2019, 3:14 PM

## 2019-02-04 NOTE — Progress Notes (Signed)
Patient ID: Antonio [name] Hund Jr., male   DOB: 1955-12-03, 63 y.o.   MRN: [id number]  PROGRESS NOTE    Antonio [name] Mehler Jr.  NWG:[id number] DOB: 1955-12-03 DOA: 11/28/2018 PCP: [name], [name], NP   Brief Narrative:  63 year old male was admitted on 11/28/2018 with influenza A causing respiratory failure and cardiogenic shock.  Patient has had prolonged hospitalization with mechanical ventilation and subsequent tracheostomy placed on December 11, 2018.  He was treated with broad-spectrum antibiotics for his Pseudomonas from his sputum culture.  He was switched to cuffless trach on 01/27/2019.  He also had PEG tube placement.  Significant events: 1/6 self extubated  1/7 re-intubated  1/8 self extubated 1/9 acute respiratory failure, mucus plugging right lower lobe, re-intubated, bronchoscopy 1/10 New fever T Max 103.1, antibiotics resumed with ceftaz and vancomycin 1/11 Hemodynamically seems to be improving. Very hypernatremic. X-ray improving. Adding free water, one-time Lasix, follow-up chemistry a.m. Hoping to initiate weaning efforts on 1/12 1/16 Continuing diuresis and ongoing ventilatory support. Working on weaning but still has significant work of breathing. Plan has been to proceed with tracheostomy. Hopefully we can continue ongoing diuresis and continue to decrease sedation 1/20 still struggling to wean. Secretions and anxiety are barriers.  1/21 trach revision to 6 distal XLT, fever, increased secretions, abx broadened / bronch 1/22 ongoing thick secretions 1/24 Failed SBT, low grade fever, Per CXR persistent pleural effusions / basilar consolidation atelectasis vs pneumonia per bases 1/25 Mucus plugged with slow recovery after chest PT and Wound Care 2/3  first time weaned for greater than 15 minutes (previously failed due to agitation, secretions, tachypnea) 2/10 TRH assumed care 2/11 trach changed to a #5 cuffless per PCCM  2/13 trach changed to #4 shiley cuffless  2/13  afternoon - pulled out new trach and "disoldged" PEG - both reinserted/repositioned  2/13 PM > 2/14 AM acute hypoxic failure - emergent bedside bronch > trach obstructed - #6 cuffed trach placed > back to ICU on vent  2/14 trach changed to #5 cuffed 2/15 TRH resumed care - resp distress > back on vent  2/15 Failed ATC with increased WOB 2/16 Failed ATC with increased WOB  2/21 back on pressure support 2/24 back on TC  01/27/19 - 5  XLT cuffless trach   Assessment & Plan:   Active Problems:   Chest pain   SIRS (systemic inflammatory response syndrome) (HCC)   Acute respiratory failure with hypoxemia (HCC)   Pressure injury of skin   Influenza A virus present   Pneumonia   Tracheostomy status (HCC)   Acute pulmonary edema (HCC)   Hypoxia   Fever, unspecified   Tachycardia   Acute on chronic respiratory failure (HCC)   Hypoxemia   Phlegm in throat   Dysphagia   Status post tracheostomy (HCC)   HCAP (healthcare-associated pneumonia)   Diabetes mellitus type 2 in nonobese (HCC)   Essential hypertension   S/P BKA (below knee amputation) unilateral, left (HCC)   Tachypnea   Pain   Agitation   Acute blood loss anemia   Acute respiratory failure (HCC)   Airway trauma  Fever -Has a temperature spikes over the last 24 hours.  Will get blood, urine cultures.  Chest x-ray.  Patient was started on meropenem on 02/03/2019.  Will switch to cefepime.  There might be a question of coccygeal wound infection.  We will add vancomycin and Flagyl.  Will request general surgery consultation for need for evaluation and debridement of the wound.  Leukocytosis -  Monitor.  Acute hypoxic respiratory failure secondary to influenza A -Patient was under the care of ICU, required multiple intubations and subsequently tracheostomy -Sputum culture from trach had grown Corynebacterium stratum and Pseudomonas recently.  He has already completed treatment with ceftazidime. -He has been switched to cuffless  trach on 01/27/2019 by pulmonary.  Pulmonary following intermittently.  Sacral/coccygeal stage IV pressure injury -Wound care evaluation appreciated.  Recommend general surgery consultation.  General surgery has been consulted.  Acute systolic heart failure -EF was 20 to 25% on echo on 11/29/2018 but improved to 60 to 65% on 12/24/2018. -Most likely secondary to sepsis -No plans for cardiac cath per cardiology -Continue beta-blocker.  Dysphagia -Status post PEG tube placement.  Continue PEG tube feeding. -SLP following the patient and is recommending dysphagia 2 diet.  Patient will need repeat MBS as an outpatient not before 6 to 8 weeks.  Acute metabolic encephalopathy -Resolved  Deconditioning -We will need SNF placement  Diabetes mellitus type 2 -Monitor CBGs.  Continue Levemir with sliding scale insulin  DVT prophylaxis: Lovenox Code Status: Full code Family Communication: None Disposition Plan: SNF in the next few days if remains afebrile.  Consultants: PCCM/cardiology/general surgery  Procedures:  As described above  Antimicrobials:  Anti-infectives (From admission, onward)   Start     Dose/Rate Route Frequency Ordered Stop   02/04/19 1300  vancomycin (VANCOCIN) 1,250 mg in sodium chloride 0.9 % 250 mL IVPB     1,250 mg 166.7 mL/hr over 90 Minutes Intravenous Every 12 hours 02/04/19 1200     02/04/19 1200  metroNIDAZOLE (FLAGYL) IVPB 500 mg     500 mg 100 mL/hr over 60 Minutes Intravenous Every 8 hours 02/04/19 1146     02/04/19 0930  ceFEPIme (MAXIPIME) 2 g in sodium chloride 0.9 % 100 mL IVPB     2 g 200 mL/hr over 30 Minutes Intravenous Every 8 hours 02/04/19 0926     02/03/19 1200  meropenem (MERREM) 1 g in sodium chloride 0.9 % 100 mL IVPB  Status:  Discontinued     1 g 200 mL/hr over 30 Minutes Intravenous Every 8 hours 02/03/19 1054 02/04/19 0924   01/19/19 1400  cefTAZidime (FORTAZ) 2 g in sodium chloride 0.9 % 100 mL IVPB     2 g 200 mL/hr over 30 Minutes  Intravenous Every 8 hours 01/19/19 1120 01/24/19 2359   01/15/19 1500  cefTAZidime (FORTAZ) 2 g in sodium chloride 0.9 % 100 mL IVPB     2 g 200 mL/hr over 30 Minutes Intravenous Every 8 hours 01/15/19 1448 01/19/19 0542   01/14/19 1400  colistimethate ((COLYMYCIN-M)) nebulized solution 150 mg  Status:  Discontinued     150 mg Inhalation 3 times daily 01/14/19 1142 01/19/19 1119   01/14/19 1300  meropenem (MERREM) 1 g in sodium chloride 0.9 % 100 mL IVPB  Status:  Discontinued     1 g 200 mL/hr over 30 Minutes Intravenous Every 8 hours 01/14/19 1235 01/15/19 1448   12/23/18 1506  vancomycin (VANCOCIN) 1-5 GM/200ML-% IVPB    Note to Pharmacy:  [facility]   : cabinet override      12/23/18 1506 12/23/18 1515   12/20/18 2200  vancomycin (VANCOCIN) IVPB 1000 mg/200 mL premix  Status:  Discontinued     1,000 mg 200 mL/hr over 60 Minutes Intravenous Every 12 hours 12/20/18 1804 12/22/18 1413   12/18/18 0800  ceFEPIme (MAXIPIME) 1 g in sodium chloride 0.9 % 100 mL IVPB  Status:  Discontinued     1 g 200 mL/hr over 30 Minutes Intravenous Every 8 hours 12/18/18 0713 12/19/18 1201   12/17/18 0530  vancomycin (VANCOCIN) 1,500 mg in sodium chloride 0.9 % 500 mL IVPB  Status:  Discontinued     1,500 mg 250 mL/hr over 120 Minutes Intravenous Every 12 hours 12/16/18 1704 12/20/18 1804   12/16/18 1730  cefTAZidime (FORTAZ) 2 g in sodium chloride 0.9 % 100 mL IVPB  Status:  Discontinued     2 g 200 mL/hr over 30 Minutes Intravenous Every 8 hours 12/16/18 1704 12/18/18 0702   12/16/18 1730  vancomycin (VANCOCIN) 2,000 mg in sodium chloride 0.9 % 500 mL IVPB     2,000 mg 250 mL/hr over 120 Minutes Intravenous  Once 12/16/18 1704 12/16/18 2030   12/07/18 1300  erythromycin 500 mg in sodium chloride 0.9 % 100 mL IVPB  Status:  Discontinued     500 mg 100 mL/hr over 60 Minutes Intravenous Every 6 hours 12/07/18 1118 12/16/18 1627   12/06/18 1500  vancomycin (VANCOCIN) 1,250 mg in sodium chloride 0.9 %  250 mL IVPB  Status:  Discontinued     1,250 mg 166.7 mL/hr over 90 Minutes Intravenous Every 12 hours 12/06/18 1436 12/07/18 1118   12/06/18 0100  vancomycin (VANCOCIN) 1,250 mg in sodium chloride 0.9 % 250 mL IVPB     1,250 mg 166.7 mL/hr over 90 Minutes Intravenous  Once 12/05/18 1054 12/06/18 0146   12/05/18 1200  vancomycin (VANCOCIN) 2,000 mg in sodium chloride 0.9 % 500 mL IVPB     2,000 mg 250 mL/hr over 120 Minutes Intravenous  Once 12/05/18 1054 12/05/18 1350   12/05/18 1200  cefTAZidime (FORTAZ) 1 g in sodium chloride 0.9 % 100 mL IVPB  Status:  Discontinued     1 g 200 mL/hr over 30 Minutes Intravenous Every 8 hours 12/05/18 1106 12/07/18 1118   11/30/18 2200  oseltamivir (TAMIFLU) 6 MG/ML suspension 30 mg     30 mg Per Tube 2 times daily 11/30/18 1348 12/03/18 2159   11/30/18 2200  ceFEPIme (MAXIPIME) 1 g in sodium chloride 0.9 % 100 mL IVPB  Status:  Discontinued     1 g 200 mL/hr over 30 Minutes Intravenous Every 12 hours 11/30/18 1350 12/01/18 1039   11/29/18 2200  ceFEPIme (MAXIPIME) 2 g in sodium chloride 0.9 % 100 mL IVPB  Status:  Discontinued     2 g 200 mL/hr over 30 Minutes Intravenous Every 12 hours 11/29/18 1645 11/30/18 1350   11/29/18 1100  oseltamivir (TAMIFLU) 6 MG/ML suspension 75 mg  Status:  Discontinued     75 mg Per Tube 2 times daily 11/29/18 1000 11/30/18 1348   11/28/18 2300  oseltamivir (TAMIFLU) capsule 75 mg  Status:  Discontinued     75 mg Oral 2 times daily 11/28/18 2238 11/29/18 1000   11/28/18 2030  oseltamivir (TAMIFLU) capsule 75 mg     75 mg Oral  Once 11/28/18 2029 11/28/18 2207   11/28/18 1745  vancomycin (VANCOCIN) 2,000 mg in sodium chloride 0.9 % 500 mL IVPB     2,000 mg 250 mL/hr over 120 Minutes Intravenous  Once 11/28/18 1741 11/28/18 2203   11/28/18 1745  piperacillin-tazobactam (ZOSYN) IVPB 3.375 g     3.375 g 100 mL/hr over 30 Minutes Intravenous  Once 11/28/18 1741 11/28/18 1834       Subjective: Patient seen and examined  at bedside.  Patient had fevers overnight.  Denies any worsening  abdominal pain, diarrhea, vomiting.  Objective: Vitals:   02/04/19 0826 02/04/19 0829 02/04/19 1342 02/04/19 1511  BP:  (!) 103/56 131/66   Pulse:  79 87   Resp:   20   Temp:  99.2 F (37.3 C) 99.2 F (37.3 C) 99 F (37.2 C)  TempSrc:  Oral  Oral  SpO2: 92% 98% 94%   Weight:      Height:        Intake/Output Summary (Last 24 hours) at 02/04/2019 1539 Last data filed at 02/04/2019 1414 Gross per 24 hour  Intake 3219.84 ml  Output 1050 ml  Net 2169.84 ml   Filed Weights   02/02/19 0257 02/03/19 0554 02/04/19 0547  Weight: 89.6 kg 120.2 kg 81.2 kg    Examination:  General exam: Appears calm and comfortable.  No distress. ENT: Janina Mayo site appears clean with trach collar in place Respiratory system: Bilateral decreased breath sounds at bases with scattered crackles Cardiovascular system: S1 & S2 heard, Rate controlled Gastrointestinal system: Abdomen is nondistended, soft and nontender. Normal bowel sounds heard. Extremities: No cyanosis, clubbing, edema.  Left BKA, right first toe ray amputation.     Data Reviewed: I have personally reviewed following labs and imaging studies  CBC: Recent Labs  Lab 02/01/19 0835 02/03/19 0732 02/04/19 1201  WBC 7.8 12.8* 12.3*  NEUTROABS  --   --  9.7*  HGB 11.7* 12.0* 11.8*  HCT 37.3* 37.7* 37.2*  MCV 87.1 85.7 86.5  PLT 165 160 171   Basic Metabolic Panel: Recent Labs  Lab 02/01/19 0835 02/03/19 0732 02/04/19 1201  NA 136 133* 132*  K 4.0 4.0 3.8  CL 96* 96* 95*  CO2 GLUCOSE 97 136* 189*  BUN 21* 21 31*  CREATININE 0.73 0.71 0.67  CALCIUM 9.5 9.1 8.6*   GFR: Estimated Creatinine Clearance: 106.4 mL/min (by C-G formula based on SCr of 0.67 mg/dL). Liver Function Tests: Recent Labs  Lab 02/04/19 1201  AST 11*  ALT 14  ALKPHOS 77  BILITOT 0.6  PROT 6.6  ALBUMIN 2.5*   No results for input(s): LIPASE, AMYLASE in the last 168 hours.  No results for input(s): AMMONIA in the last 168 hours. Coagulation Profile: No results for input(s): INR, PROTIME in the last 168 hours. Cardiac Enzymes: No results for input(s): CKTOTAL, CKMB, CKMBINDEX, TROPONINI in the last 168 hours. BNP (last 3 results) No results for input(s): PROBNP in the last 8760 hours. HbA1C: No results for input(s): HGBA1C in the last 72 hours. CBG: Recent Labs  Lab 02/03/19 1249 02/03/19 1646 02/03/19 2203 02/04/19 0806 02/04/19 1234  GLUCAP 178* 166* 217* 192* 179*   Lipid Profile: No results for input(s): CHOL, HDL, LDLCALC, TRIG, CHOLHDL, LDLDIRECT in the last 72 hours. Thyroid Function Tests: No results for input(s): TSH, T4TOTAL, FREET4, T3FREE, THYROIDAB in the last 72 hours. Anemia Panel: No results for input(s): VITAMINB12, FOLATE, FERRITIN, TIBC, IRON, RETICCTPCT in the last 72 hours. Sepsis Labs: Recent Labs  Lab 02/04/19 1201  PROCALCITON 0.26    Recent Results (from the past 240 hour(s))  Expectorated sputum assessment w rflx to resp cult     Status: None   Collection Time: 02/04/19 10:44 AM  Result Value Ref Range Status   Specimen Description SPUTUM  Final   Special Requests NONE  Final   Sputum evaluation   Final    THIS SPECIMEN IS ACCEPTABLE FOR SPUTUM CULTURE Performed at [facility], [address], [place], [place]  [address]    Report Status 02/04/2019 FINAL  Final  Culture, respiratory     Status: None (Preliminary result)   Collection Time: 02/04/19 10:44 AM  Result Value Ref Range Status   Specimen Description SPUTUM  Final   Special Requests NONE Reflexed from [id number]  Final   Gram Stain   Final    ABUNDANT WBC PRESENT, PREDOMINANTLY PMN RARE SQUAMOUS EPITHELIAL CELLS PRESENT RARE GRAM POSITIVE RODS Performed at [facility], [address] [place], [place] [address]    Culture PENDING  Incomplete   Report Status PENDING  Incomplete         Radiology Studies: Dg Chest Port 1 View   Result Date: 02/04/2019 CLINICAL DATA:  Dyspnea. EXAM: PORTABLE CHEST 1 VIEW COMPARISON:  One-view chest x-ray 02/03/2019 FINDINGS: The heart is mildly enlarged. Mild pulmonary vascular congestion is stable. Elevation of the right hemidiaphragm is again noted. There is some volume loss on the right with associated atelectasis. No significant airspace consolidation is present. A small right effusion is not excluded. IMPRESSION: 1. Mild pulmonary vascular congestion without frank edema. 2. Similar appearance of right basilar atelectasis. Electronically Signed   By: Marin Roberts M.D.   On: 02/04/2019 08:31   Dg Chest Port 1 View  Result Date: 02/03/2019 CLINICAL DATA:  Shortness of breath EXAM: PORTABLE CHEST 1 VIEW COMPARISON:  01/19/2019 FINDINGS: Cardiac shadows within normal limits. Elevation of the right hemidiaphragm is noted with right basilar atelectasis. No other focal infiltrate is seen. Gastrostomy tube is noted within the stomach. IMPRESSION: Mild right basilar atelectasis. Electronically Signed   By: Alcide Clever M.D.   On: 02/03/2019 08:24   Dg Swallowing Func-speech Pathology  Result Date: 02/04/2019 Objective Swallowing Evaluation: Type of Study: MBS-Modified Barium Swallow Study  Patient Details Name: Antonio [name]. MRN: [id number] Date of Birth: 1956/09/10 Today's Date: 02/04/2019 Time: SLP Start Time (ACUTE ONLY): 0920 -SLP Stop Time (ACUTE ONLY): 0931 SLP Time Calculation (min) (ACUTE ONLY): 11 min Past Medical History: Past Medical History: Diagnosis Date . Coronary artery disease  . Diabetes mellitus without complication (HCC)  . Hyperlipidemia  . Hypertension  . Osteomyelitis (HCC) 03/25/2017  RT FOOT Past Surgical History: Past Surgical History: Procedure Laterality Date . AMPUTATION Right 03/27/2017  Procedure: 1st Ray Amputation Right Foot;  Surgeon: Nadara Mustard, MD;  Location: [facility] OR;  Service: Orthopedics;  Laterality: Right; . APPENDECTOMY   . BELOW KNEE LEG AMPUTATION  Left  . CARDIAC CATHETERIZATION   . CORONARY STENT INTERVENTION  2005 . Great toe amputation right.   . I&D EXTREMITY Left 12/30/2015  Procedure: IRRIGATION AND DEBRIDEMENT LEFT FOOT, TRANSMETATARSAL AMPUTATION WITH APPLICATION OF ANTIBIOTIC BEADS AND WOUND VAC;  Surgeon: Nadara Mustard, MD;  Location: [facility] OR;  Service: Orthopedics;  Laterality: Left; . IR GASTROSTOMY TUBE MOD SED  12/23/2018 . TONSILLECTOMY   . TRACHEOSTOMY  11/2018 HPI: 63 year old male coming in with fever cough chest pain and shortness of breath was found to have influenza A. PMH: diabetes mellitus, hypertension.  Intubated multiple times 1/3 to 1/16 with trach placed 1/16. Repeat MBS on 3/28 for possible initiation of po's. This is pt's 3rd MBS. Plan to repeat to determine upgrade initially; on 3/10 concern pt may have aspirated and made NPO.   Subjective: pt is alert and cooperative but needs encouragement Assessment / Plan / Recommendation CHL IP CLINICAL IMPRESSIONS 02/04/2019 Clinical Impression Pt has been decannulated since 3/9 with evidence of incomplete stoma closure indicated by breathy  vocal quality. He continues to exhibit moderate-severe pharyngeal dysphagia noteably due to incompetent laryngeal closure and poor retroflexion of epiglottis. Honey thick penetrated vestibule with initial swallow of MBS and expelled with cues for cough and was not penetrated with subsequent trial. Nectar thick entered vestibule with penetration aspiration score (PAS) of 3, 5 & 7 (did not exit trachea with cough). Chin tuck actually facilitated entrance of barium into trachea and swallow with head in neutral applying pressure to gauze/stoma to increase pressure was ineffective. Mild-mod amount of vallecular and pyriform sinus residue not significantly improved. Recommend pt resume po's of honey thick liquids, texture of Dys 2, multiple swallows and hard coughs/throat clear after every other bite/sip. Pt should continue to receive ST at skilled nursing facility  for continued intervention and repeat MBS when appropriate (not before 6-8 weeks). Will continue to follow while here.  SLP Visit Diagnosis Dysphagia, pharyngeal phase (R13.13) Attention and concentration deficit following -- Frontal lobe and executive function deficit following -- Impact on safety and function Severe aspiration risk   CHL IP TREATMENT RECOMMENDATION 02/04/2019 Treatment Recommendations Therapy as outlined in treatment plan below   Prognosis 02/04/2019 Prognosis for Safe Diet Advancement Good Barriers to Reach Goals -- Barriers/Prognosis Comment -- CHL IP DIET RECOMMENDATION 02/04/2019 SLP Diet Recommendations Dysphagia 2 (Fine chop) solids;Honey thick liquids Liquid Administration via Cup;No straw Medication Administration Whole meds with puree Compensations Slow rate;Small sips/bites;Multiple dry swallows after each bite/sip;Hard cough after swallow;Clear throat intermittently Postural Changes Seated upright at 90 degrees;Remain semi-upright after after feeds/meals (Comment)   CHL IP OTHER RECOMMENDATIONS 02/04/2019 Recommended Consults -- Oral Care Recommendations Oral care BID Other Recommendations --   CHL IP FOLLOW UP RECOMMENDATIONS 02/04/2019 Follow up Recommendations Skilled Nursing facility   [facility] IP FREQUENCY AND DURATION 02/04/2019 Speech Therapy Frequency (ACUTE ONLY) min 2x/week Treatment Duration 2 weeks      CHL IP ORAL PHASE 02/04/2019 Oral Phase WFL Oral - Pudding Teaspoon -- Oral - Pudding Cup -- Oral - Honey Teaspoon -- Oral - Honey Cup WFL Oral - Nectar Teaspoon -- Oral - Nectar Cup WFL Oral - Nectar Straw -- Oral - Thin Teaspoon -- Oral - Thin Cup NT Oral - Thin Straw -- Oral - Puree NT Oral - Mech Soft -- Oral - Regular NT Oral - Multi-Consistency -- Oral - Pill -- Oral Phase - Comment --  CHL IP PHARYNGEAL PHASE 02/04/2019 Pharyngeal Phase Impaired Pharyngeal- Pudding Teaspoon -- Pharyngeal -- Pharyngeal- Pudding Cup -- Pharyngeal -- Pharyngeal- Honey Teaspoon NT Pharyngeal --  Pharyngeal- Honey Cup Penetration/Aspiration during swallow;Pharyngeal residue - valleculae;Pharyngeal residue - pyriform;Reduced airway/laryngeal closure Pharyngeal Material enters airway, remains ABOVE vocal cords and not ejected out Pharyngeal- Nectar Teaspoon NT Pharyngeal -- Pharyngeal- Nectar Cup Penetration/Aspiration during swallow;Reduced airway/laryngeal closure;Pharyngeal residue - valleculae;Pharyngeal residue - pyriform Pharyngeal Material enters airway, passes BELOW cords and not ejected out despite cough attempt by patient;Material enters airway, CONTACTS cords and not ejected out Pharyngeal- Nectar Straw -- Pharyngeal -- Pharyngeal- Thin Teaspoon NT Pharyngeal -- Pharyngeal- Thin Cup NT Pharyngeal -- Pharyngeal- Thin Straw -- Pharyngeal -- Pharyngeal- Puree NT Pharyngeal -- Pharyngeal- Mechanical Soft -- Pharyngeal -- Pharyngeal- Regular NT Pharyngeal -- Pharyngeal- Multi-consistency -- Pharyngeal -- Pharyngeal- Pill -- Pharyngeal -- Pharyngeal Comment --  CHL IP CERVICAL ESOPHAGEAL PHASE 02/04/2019 Cervical Esophageal Phase WFL Pudding Teaspoon -- Pudding Cup -- Honey Teaspoon -- Honey Cup -- Nectar Teaspoon -- Nectar Cup -- Nectar Straw -- Thin Teaspoon -- Thin Cup -- Thin Straw -- Puree -- Mechanical  Soft -- Regular -- Multi-consistency -- Pill -- Cervical Esophageal Comment -- Royce Macadamia 02/04/2019, 10:23 AM Breck Coons [employer] administrator Pager [phone] Office [phone]                   Scheduled Meds: . arformoterol  15 mcg Nebulization BID  . aspirin  81 mg Per Tube Daily  . atorvastatin  40 mg Per Tube q1800  . budesonide (PULMICORT) nebulizer solution  0.5 mg Nebulization BID  . carvedilol  3.125 mg Per Tube BID WC  . collagenase   Topical Daily  . enoxaparin (LOVENOX) injection  40 mg Subcutaneous Q24H  . famotidine  20 mg Per Tube BID  . folic acid  1 mg Per Tube Daily  . free water  150 mL Per Tube Q8H  . guaiFENesin  5 mL Per Tube  Q12H  . insulin aspart  0-20 Units Subcutaneous TID WC  . insulin detemir  25 Units Subcutaneous BID  . multivitamin  15 mL Per Tube Daily  . nutrition supplement (JUVEN)  1 packet Per Tube BID BM  . tapentadol  50 mg Oral QID  . thiamine  100 mg Per Tube Daily   Continuous Infusions: . sodium chloride 10 mL/hr at 01/24/19 2000  . ceFEPime (MAXIPIME) IV 2 g (02/04/19 1032)  . feeding supplement (OSMOLITE 1.2 CAL) 1,000 mL (02/03/19 1348)  . metronidazole 500 mg (02/04/19 1258)  . vancomycin 1,250 mg (02/04/19 1358)     LOS: 68 days        Glade Lloyd, MD [facility] 02/04/2019, 3:39 PM

## 2019-02-04 NOTE — Progress Notes (Signed)
Modified Barium Swallow Progress Note  Patient Details  Name: Antonio [name]. MRN: [id number] Date of Birth: [birth year]  Today's Date: 02/04/2019  Modified Barium Swallow completed.  Full report located under Chart Review in the Imaging Section.  Brief recommendations include the following:  Clinical Impression  Pt has been decannulated since 3/9 with evidence of incomplete stoma closure indicated by breathy vocal quality. He continues to exhibit moderate-severe pharyngeal dysphagia noteably due to incompetent laryngeal closure and poor retroflexion of epiglottis. Honey thick penetrated vestibule with initial swallow of MBS and expelled with cues for cough and was not penetrated with subsequent trial. Nectar thick entered vestibule with penetration aspiration score (PAS) of 3, 5 & 7 (did not exit trachea with cough). Chin tuck actually facilitated entrance of barium into trachea and swallow with head in neutral applying pressure to gauze/stoma to increase pressure was ineffective. Mild-mod amount of vallecular and pyriform sinus residue not significantly improved. Recommend pt resume po's of honey thick liquids, texture of Dys 2, multiple swallows and hard coughs/throat clear after every other bite/sip. Pt should continue to receive ST at skilled nursing facility for continued intervention and repeat MBS when appropriate (not before 6-8 weeks). Will continue to follow while here.    Swallow Evaluation Recommendations       SLP Diet Recommendations: Dysphagia 2 (Fine chop) solids;Honey thick liquids   Liquid Administration via: Cup;No straw   Medication Administration: Whole meds with puree   Supervision: Patient able to self feed   Compensations: Slow rate;Small sips/bites;Multiple dry swallows after each bite/sip;Hard cough after swallow;Clear throat intermittently   Postural Changes: Seated upright at 90 degrees;Remain semi-upright after after feeds/meals (Comment)   Oral Care  Recommendations: Oral care BID        Royce Macadamia 02/04/2019,10:24 AM  Breck Coons [employer]ell [employer].Ed Nurse, children's [phone] Office [phone]

## 2019-02-04 NOTE — Progress Notes (Signed)
Physical Therapy Wound Treatment Patient Details  Name: Antonio [name]. MRN: [id number] Date of Birth: 22-Dec-1955  Today's Date: 02/04/2019 Time: 1100-1143 Time Calculation (min): 43 min  Subjective  Subjective: Pt mother and father in law in room Patient and Family Stated Goals: "go home." Date of Onset: (unknown) Prior Treatments: dressing changes  Pain Score: Pain with pulsatile lavage and debridement in left glute region; Pre medicated  Wound Assessment  Pressure Injury 12/01/18 Unstageable - Full thickness tissue loss in which the base of the ulcer is covered by slough (yellow, tan, gray, green or brown) and/or eschar (tan, brown or black) in the wound bed. sacrum (Active)  Wound Image   02/04/2019 12:12 PM  Dressing Type Barrier Film (skin prep);ABD;Moist to moist;Gauze (Comment);Other (Comment) 02/04/2019 12:12 PM  Dressing Clean;Dry;Intact;Changed 02/04/2019 12:12 PM  Dressing Change Frequency Daily 02/04/2019 12:12 PM  State of Healing Early/partial granulation 02/04/2019 12:12 PM  Site / Wound Assessment Red;Yellow;Pink 02/04/2019 12:12 PM  % Wound base Red or Granulating 50% 02/04/2019 12:12 PM  % Wound base Yellow/Fibrinous Exudate 40% 02/04/2019 12:12 PM  % Wound base Black/Eschar 10% 02/04/2019 12:12 PM  % Wound base Other/Granulation Tissue (Comment) 0% 02/04/2019 12:12 PM  Peri-wound Assessment Erythema (blanchable) 02/04/2019 12:12 PM  Wound Length (cm) 9 cm 02/04/2019 12:12 PM  Wound Width (cm) 5 cm 02/04/2019 12:12 PM  Wound Depth (cm) 2 cm 02/04/2019 12:12 PM  Wound Surface Area (cm^2) 45 cm^2 02/04/2019 12:12 PM  Wound Volume (cm^3) 90 cm^3 02/04/2019 12:12 PM  Tunneling (cm) 0 12/31/2018 10:28 AM  Margins Unattached edges (unapproximated) 02/04/2019 12:12 PM  Drainage Amount Minimal 02/04/2019 12:12 PM  Drainage Description Serosanguineous;Odor 02/04/2019 12:12 PM  Treatment Debridement (Selective);Hydrotherapy (Pulse lavage);Packing (Saline gauze) 02/04/2019 12:12 PM       Hydrotherapy Pulsed lavage therapy - wound location: sacrum Pulsed Lavage with Suction (psi): 8 psi(8-12) Pulsed Lavage with Suction - Normal Saline Used: 1000 mL Pulsed Lavage Tip: Tip with splash shield Selective Debridement Selective Debridement - Location: sacrum Selective Debridement - Tools Used: Forceps;Scissors Selective Debridement - Tissue Removed: yellow necrotic tissue, eschar   Wound Assessment and Plan  Wound Therapy - Assess/Plan/Recommendations Wound Therapy - Clinical Statement: New eschar noted around superior edge of wound. Superior opening of wound increasing in depth. Pt unable to tolerate significant debridement due to pain. Pt will continue to benefit from MWF hydrotherapy for removing bioburden and promoting healing.  Wound Therapy - Functional Problem List: decr mobility Factors Delaying/Impairing Wound Healing: Diabetes Mellitus;Immobility;Multiple medical problems Hydrotherapy Plan: Debridement;Dressing change;Patient/family education;Pulsatile lavage with suction Wound Therapy - Frequency: 3X / week Wound Therapy - Follow Up Recommendations: Skilled nursing facility Wound Plan: see above  Wound Therapy Goals- Improve the function of patient's integumentary system by progressing the wound(s) through the phases of wound healing (inflammation - proliferation - remodeling) by: Decrease Necrotic Tissue to: 30 Decrease Necrotic Tissue - Progress: Progressing toward goal Increase Granulation Tissue to: 70 Increase Granulation Tissue - Progress: Progressing toward goal Goals/treatment plan/discharge plan were made with and agreed upon by patient/family: Yes Time For Goal Achievement: 7 days Wound Therapy - Potential for Goals: Fair  Goals will be updated until maximal potential achieved or discharge criteria met.  Discharge criteria: when goals achieved, discharge from hospital, MD decision/surgical intervention, no progress towards goals, refusal/missing three  consecutive treatments without notification or medical reason.  GP  Antonio [name], PT, DPT Acute Rehabilitation Services Pager [phone] Office [phone]      Antonio [name]  02/04/2019, 12:15 PM

## 2019-02-04 NOTE — Progress Notes (Signed)
PT Cancellation Note  Patient Details Name: Antonio [name]. MRN: [id number] DOB: [birth year]   Cancelled Treatment:    Reason Eval/Treat Not Completed: Patient declined, no reason specified.  "I want to eat my supper."  Will not be able to return.  Will return tomorrow as able. 02/04/2019  [place] Bing, PT Acute Rehabilitation Services [phone]  (pager) [phone]  (office)    Eliseo Gum Judi Jaffe 02/04/2019, 5:29 PM

## 2019-02-04 NOTE — Progress Notes (Addendum)
Pharmacy Antibiotic Note  Antonio [name]. is a 63 y.o. male admitted on 11/28/2018, now with recent PSA PNA, now with aspiration.  Pharmacy has been consulted for Cefepime/Vanco dosing.   ID: Pseudomonas PNA. S/p abx for Corynebacterium in [facility], may have been resistant to erythro. Tmax 102.8 overnight. WBC 12.8 up. Aspirated, resume Merrem 3/10. Scr WNL - S/p Tamiflu for Flu A  Cefepime 1/23 >> 1/24, 3/11>> Vanc 1/10>1/12, 1/21 >> 1/27 [name] 1/10>1/12, 1/21 > 1/23; 2/20>> (2/29) Erythromycin 1/12 >> 1/21 2/19 Merrem >>2/20, 3/10>>3/11 2/19 Inh. Colistin>> 2/24   Vancomycin 1250 mg IV Q 12 hrs. Goal AUC 400-550. Expected AUC: 461 SCr used: 0.8   2/18 Sputum: mod Pseudomonas (pan-s) 1/21 resp cx: corynebacterium  1/9 resp cx: mod diphtheroids (corynebacterium) 1/10 blood cx:  negF 1/9 resp cx: NF 1/7 resp cx: NF 1/3 blood cx:neg 1/4 urine cx: neg 1/4 mrsa pcr: neg 1/3 RVP: +flu A  Plan: D/c Merrem Start Cefepime 2g IV q 8 hrs. Vancomycin 1250mg  IV q 12hrs. Levels at steady state if continued    Height: 6' (182.9 cm) Weight: 179 lb 1.6 oz (81.2 kg) IBW/kg (Calculated) : 77.6  Temp (24hrs), Avg:100.4 F (38 C), Min:99.1 F (37.3 C), Max:102.8 F (39.3 C)  Recent Labs  Lab 02/01/19 0835 02/03/19 0732  WBC 7.8 12.8*  CREATININE 0.73 0.71    Estimated Creatinine Clearance: 106.4 mL/min (by C-G formula based on SCr of 0.71 mg/dL).    Allergies  Allergen Reactions  . Iodinated Diagnostic Agents Hives, Rash and Other (See Comments)    Blisters Staph Blisters / staph  . Penicillins Other (See Comments)    UNSPECIFIED REACTION  Has patient had a PCN reaction causing immediate rash, facial/tongue/throat swelling, SOB or lightheadedness with hypotension: No Has patient had a PCN reaction causing severe rash involving mucus membranes or skin necrosis: No Has patient had a PCN reaction that required hospitalization: No Has patient had a PCN reaction occurring  within the last 10 years: No If all of the above answers are "NO", then may proceed with Cephalosporin use.     [name] S. Merilynn Finland, PharmD, BCPS Clinical Staff Pharmacist Misty Stanley [facility] 02/04/2019 9:29 AM

## 2019-02-05 LAB — CBC WITH DIFFERENTIAL/PLATELET
ABS IMMATURE GRANULOCYTES: 0.03 10*3/uL (ref 0.00–0.07)
BASOS PCT: 0 %
Basophils Absolute: 0 10*3/uL (ref 0.0–0.1)
Eosinophils Absolute: 0.2 10*3/uL (ref 0.0–0.5)
Eosinophils Relative: 2 %
HCT: 33.9 % — ABNORMAL LOW (ref 39.0–52.0)
Hemoglobin: 10.7 g/dL — ABNORMAL LOW (ref 13.0–17.0)
Immature Granulocytes: 0 %
Lymphocytes Relative: 11 %
Lymphs Abs: 1.1 10*3/uL (ref 0.7–4.0)
MCH: 27.1 pg (ref 26.0–34.0)
MCHC: 31.6 g/dL (ref 30.0–36.0)
MCV: 85.8 fL (ref 80.0–100.0)
Monocytes Absolute: 0.7 10*3/uL (ref 0.1–1.0)
Monocytes Relative: 8 %
NEUTROS ABS: 7.8 10*3/uL — AB (ref 1.7–7.7)
Neutrophils Relative %: 79 %
Platelets: 167 10*3/uL (ref 150–400)
RBC: 3.95 MIL/uL — ABNORMAL LOW (ref 4.22–5.81)
RDW: 16 % — ABNORMAL HIGH (ref 11.5–15.5)
WBC: 9.9 10*3/uL (ref 4.0–10.5)
nRBC: 0 % (ref 0.0–0.2)

## 2019-02-05 LAB — BASIC METABOLIC PANEL
Anion gap: 8 (ref 5–15)
BUN: 23 mg/dL (ref 8–23)
CO2: 29 mmol/L (ref 22–32)
Calcium: 8.4 mg/dL — ABNORMAL LOW (ref 8.9–10.3)
Chloride: 96 mmol/L — ABNORMAL LOW (ref 98–111)
Creatinine, Ser: 0.58 mg/dL — ABNORMAL LOW (ref 0.61–1.24)
GFR calc Af Amer: >60 mL/min (ref >60)
GFR calc non Af Amer: >60 mL/min (ref >60)
Glucose, Bld: 191 mg/dL — ABNORMAL HIGH (ref 70–99)
Potassium: 4 mmol/L (ref 3.5–5.1)
Sodium: 133 mmol/L — ABNORMAL LOW (ref 135–145)

## 2019-02-05 LAB — GLUCOSE, CAPILLARY
Glucose-Capillary: 242 mg/dL — ABNORMAL HIGH (ref 70–99)
Glucose-Capillary: 236 mg/dL — ABNORMAL HIGH (ref 70–99)
Glucose-Capillary: 146 mg/dL — ABNORMAL HIGH (ref 70–99)
Glucose-Capillary: 148 mg/dL — ABNORMAL HIGH (ref 70–99)

## 2019-02-05 LAB — URINE CULTURE: Culture: NO GROWTH

## 2019-02-05 LAB — C-REACTIVE PROTEIN: CRP: 15.5 mg/dL — ABNORMAL HIGH (ref <1.0)

## 2019-02-05 LAB — MAGNESIUM: Magnesium: 1.8 mg/dL (ref 1.7–2.4)

## 2019-02-05 MED ORDER — OSMOLITE 1.2 CAL PO LIQD
1000.0000 mL | ORAL | Status: DC
  Administered 2019-02-05 – 2019-02-11 (×8): 1000 mL
  Filled 2019-02-05 (×9): qty 1000

## 2019-02-05 NOTE — Progress Notes (Signed)
Patient ID: Antonio Richards., male   DOB: 1956/03/09, 63 y.o.   MRN: [id number]  PROGRESS NOTE    Antonio Richards.  WYO:[id number] DOB: 02/19/1956 DOA: 11/28/2018 PCP: Dema Severin, NP   Brief Narrative:  63 year old male was admitted on 11/28/2018 with influenza A causing respiratory failure and cardiogenic shock.  Patient has had prolonged hospitalization with mechanical ventilation and subsequent tracheostomy placed on December 11, 2018.  He was treated with broad-spectrum antibiotics for his Pseudomonas from his sputum culture.  He was switched to cuffless trach on 01/27/2019.  He also had PEG tube placement.  Significant events: 1/6 self extubated  1/7 re-intubated  1/8 self extubated 1/9 acute respiratory failure, mucus plugging right lower lobe, re-intubated, bronchoscopy 1/10 New fever T Max 103.1, antibiotics resumed with ceftaz and vancomycin 1/11 Hemodynamically seems to be improving. Very hypernatremic. X-ray improving. Adding free water, one-time Lasix, follow-up chemistry a.m. Hoping to initiate weaning efforts on 1/12 1/16 Continuing diuresis and ongoing ventilatory support. Working on weaning but still has significant work of breathing. Plan has been to proceed with tracheostomy. Hopefully we can continue ongoing diuresis and continue to decrease sedation 1/20 still struggling to wean. Secretions and anxiety are barriers.  1/21 trach revision to 6 distal XLT, fever, increased secretions, abx broadened / bronch 1/22 ongoing thick secretions 1/24 Failed SBT, low grade fever, Per CXR persistent pleural effusions / basilar consolidation atelectasis vs pneumonia per bases 1/25 Mucus plugged with slow recovery after chest PT and Wound Care 2/3  first time weaned for greater than 15 minutes (previously failed due to agitation, secretions, tachypnea) 2/10 TRH assumed care 2/11 trach changed to a #5 cuffless per PCCM  2/13 trach changed to #4 shiley cuffless  2/13  afternoon - pulled out new trach and "disoldged" PEG - both reinserted/repositioned  2/13 PM > 2/14 AM acute hypoxic failure - emergent bedside bronch > trach obstructed - #6 cuffed trach placed > back to ICU on vent  2/14 trach changed to #5 cuffed 2/15 TRH resumed care - resp distress > back on vent  2/15 Failed ATC with increased WOB 2/16 Failed ATC with increased WOB  2/21 back on pressure support 2/24 back on TC  01/27/19 - 5  XLT cuffless trach   Assessment & Plan:   Active Problems:   Chest pain   SIRS (systemic inflammatory response syndrome) (HCC)   Acute respiratory failure with hypoxemia (HCC)   Pressure injury of skin   Influenza A virus present   Pneumonia   Tracheostomy status (HCC)   Acute pulmonary edema (HCC)   Hypoxia   Fever, unspecified   Tachycardia   Acute on chronic respiratory failure (HCC)   Hypoxemia   Phlegm in throat   Dysphagia   Status post tracheostomy (HCC)   HCAP (healthcare-associated pneumonia)   Diabetes mellitus type 2 in nonobese (HCC)   Essential hypertension   S/P BKA (below knee amputation) unilateral, left (HCC)   Tachypnea   Pain   Agitation   Acute blood loss anemia   Acute respiratory failure (HCC)   Airway trauma  Fever -Still had a temperature spike last night.  Follow cultures.  Chest x-ray was negative for infiltrates.  -Currently on cefepime, vancomycin and Flagyl. -If patient continues to spike temperatures, might have to get CT of the chest abdomen and pelvis.    Leukocytosis -Resolved.  Acute hypoxic respiratory failure secondary to influenza A -Patient was under the care of ICU, required multiple intubations  and subsequently tracheostomy -Sputum culture from trach had grown Corynebacterium stratum and Pseudomonas recently.  He has already completed treatment with ceftazidime. -He has been switched to cuffless trach on 01/27/2019 by pulmonary.  Pulmonary following intermittently.  Sacral/coccygeal stage IV  pressure injury -Wound care evaluation appreciated.   -General surgery evaluation has been appreciated: No need for any surgical debridement at this time.  Continue wound care and hydrotherapy.  MRI of the spine was negative for any evidence of osteomyelitis in the lumbosacral spine.  Acute systolic heart failure -EF was 20 to 25% on echo on 11/29/2018 but improved to 60 to 65% on 12/24/2018. -Most likely secondary to sepsis -No plans for cardiac cath per cardiology -Continue beta-blocker.  Continue strict input and output.  Dysphagia -Status post PEG tube placement.  Continue PEG tube feeding. -SLP following the patient and is recommending dysphagia 2 diet.  Patient will need repeat MBS as an outpatient not before 6 to 8 weeks.  Acute metabolic encephalopathy -Resolved  Deconditioning -We will need SNF placement  Diabetes mellitus type 2 -Monitor CBGs.  Continue Levemir with sliding scale insulin  DVT prophylaxis: Lovenox Code Status: Full code Family Communication: None Disposition Plan: SNF in the next few days if remains afebrile.  Consultants: PCCM/cardiology/general surgery  Procedures:  As described above  Antimicrobials:  Anti-infectives (From admission, onward)   Start     Dose/Rate Route Frequency Ordered Stop   02/04/19 1300  vancomycin (VANCOCIN) 1,250 mg in sodium chloride 0.9 % 250 mL IVPB     1,250 mg 166.7 mL/hr over 90 Minutes Intravenous Every 12 hours 02/04/19 1200     02/04/19 1200  metroNIDAZOLE (FLAGYL) IVPB 500 mg     500 mg 100 mL/hr over 60 Minutes Intravenous Every 8 hours 02/04/19 1146     02/04/19 0930  ceFEPIme (MAXIPIME) 2 g in sodium chloride 0.9 % 100 mL IVPB     2 g 200 mL/hr over 30 Minutes Intravenous Every 8 hours 02/04/19 0926     02/03/19 1200  meropenem (MERREM) 1 g in sodium chloride 0.9 % 100 mL IVPB  Status:  Discontinued     1 g 200 mL/hr over 30 Minutes Intravenous Every 8 hours 02/03/19 1054 02/04/19 0924   01/19/19 1400   cefTAZidime (FORTAZ) 2 g in sodium chloride 0.9 % 100 mL IVPB     2 g 200 mL/hr over 30 Minutes Intravenous Every 8 hours 01/19/19 1120 01/24/19 2359   01/15/19 1500  cefTAZidime (FORTAZ) 2 g in sodium chloride 0.9 % 100 mL IVPB     2 g 200 mL/hr over 30 Minutes Intravenous Every 8 hours 01/15/19 1448 01/19/19 0542   01/14/19 1400  colistimethate ((COLYMYCIN-M)) nebulized solution 150 mg  Status:  Discontinued     150 mg Inhalation 3 times daily 01/14/19 1142 01/19/19 1119   01/14/19 1300  meropenem (MERREM) 1 g in sodium chloride 0.9 % 100 mL IVPB  Status:  Discontinued     1 g 200 mL/hr over 30 Minutes Intravenous Every 8 hours 01/14/19 1235 01/15/19 1448   12/23/18 1506  vancomycin (VANCOCIN) 1-5 GM/200ML-% IVPB    Note to Pharmacy:  [facility]   : cabinet override      12/23/18 1506 12/23/18 1515   12/20/18 2200  vancomycin (VANCOCIN) IVPB 1000 mg/200 mL premix  Status:  Discontinued     1,000 mg 200 mL/hr over 60 Minutes Intravenous Every 12 hours 12/20/18 1804 12/22/18 1413   12/18/18 0800  ceFEPIme (  MAXIPIME) 1 g in sodium chloride 0.9 % 100 mL IVPB  Status:  Discontinued     1 g 200 mL/hr over 30 Minutes Intravenous Every 8 hours 12/18/18 0713 12/19/18 1201   12/17/18 0530  vancomycin (VANCOCIN) 1,500 mg in sodium chloride 0.9 % 500 mL IVPB  Status:  Discontinued     1,500 mg 250 mL/hr over 120 Minutes Intravenous Every 12 hours 12/16/18 1704 12/20/18 1804   12/16/18 1730  cefTAZidime (FORTAZ) 2 g in sodium chloride 0.9 % 100 mL IVPB  Status:  Discontinued     2 g 200 mL/hr over 30 Minutes Intravenous Every 8 hours 12/16/18 1704 12/18/18 0702   12/16/18 1730  vancomycin (VANCOCIN) 2,000 mg in sodium chloride 0.9 % 500 mL IVPB     2,000 mg 250 mL/hr over 120 Minutes Intravenous  Once 12/16/18 1704 12/16/18 2030   12/07/18 1300  erythromycin 500 mg in sodium chloride 0.9 % 100 mL IVPB  Status:  Discontinued     500 mg 100 mL/hr over 60 Minutes Intravenous Every 6 hours  12/07/18 1118 12/16/18 1627   12/06/18 1500  vancomycin (VANCOCIN) 1,250 mg in sodium chloride 0.9 % 250 mL IVPB  Status:  Discontinued     1,250 mg 166.7 mL/hr over 90 Minutes Intravenous Every 12 hours 12/06/18 1436 12/07/18 1118   12/06/18 0100  vancomycin (VANCOCIN) 1,250 mg in sodium chloride 0.9 % 250 mL IVPB     1,250 mg 166.7 mL/hr over 90 Minutes Intravenous  Once 12/05/18 1054 12/06/18 0146   12/05/18 1200  vancomycin (VANCOCIN) 2,000 mg in sodium chloride 0.9 % 500 mL IVPB     2,000 mg 250 mL/hr over 120 Minutes Intravenous  Once 12/05/18 1054 12/05/18 1350   12/05/18 1200  cefTAZidime (FORTAZ) 1 g in sodium chloride 0.9 % 100 mL IVPB  Status:  Discontinued     1 g 200 mL/hr over 30 Minutes Intravenous Every 8 hours 12/05/18 1106 12/07/18 1118   11/30/18 2200  oseltamivir (TAMIFLU) 6 MG/ML suspension 30 mg     30 mg Per Tube 2 times daily 11/30/18 1348 12/03/18 2159   11/30/18 2200  ceFEPIme (MAXIPIME) 1 g in sodium chloride 0.9 % 100 mL IVPB  Status:  Discontinued     1 g 200 mL/hr over 30 Minutes Intravenous Every 12 hours 11/30/18 1350 12/01/18 1039   11/29/18 2200  ceFEPIme (MAXIPIME) 2 g in sodium chloride 0.9 % 100 mL IVPB  Status:  Discontinued     2 g 200 mL/hr over 30 Minutes Intravenous Every 12 hours 11/29/18 1645 11/30/18 1350   11/29/18 1100  oseltamivir (TAMIFLU) 6 MG/ML suspension 75 mg  Status:  Discontinued     75 mg Per Tube 2 times daily 11/29/18 1000 11/30/18 1348   11/28/18 2300  oseltamivir (TAMIFLU) capsule 75 mg  Status:  Discontinued     75 mg Oral 2 times daily 11/28/18 2238 11/29/18 1000   11/28/18 2030  oseltamivir (TAMIFLU) capsule 75 mg     75 mg Oral  Once 11/28/18 2029 11/28/18 2207   11/28/18 1745  vancomycin (VANCOCIN) 2,000 mg in sodium chloride 0.9 % 500 mL IVPB     2,000 mg 250 mL/hr over 120 Minutes Intravenous  Once 11/28/18 1741 11/28/18 2203   11/28/18 1745  piperacillin-tazobactam (ZOSYN) IVPB 3.375 g     3.375 g 100 mL/hr over 30  Minutes Intravenous  Once 11/28/18 1741 11/28/18 1834        Subjective:  Patient seen and examined at bedside.  Patient denies any worsening cough, shortness of breath, abdominal pain, diarrhea.  He still had fever last night. Objective: Vitals:   02/05/19 0506 02/05/19 1000 02/05/19 1205 02/05/19 1327  BP: (!) 149/71   (!) 129/91  Pulse: 96   86  Resp: 18   (!) 26  Temp: 98.1 F (36.7 C)  98.6 F (37 C) 99 F (37.2 C)  TempSrc: Oral  Oral Oral  SpO2: (!) 87% 95%  95%  Weight:      Height:        Intake/Output Summary (Last 24 hours) at 02/05/2019 1331 Last data filed at 02/05/2019 0400 Gross per 24 hour  Intake 3328.59 ml  Output 650 ml  Net 2678.59 ml   Filed Weights   02/02/19 0257 02/03/19 0554 02/04/19 0547  Weight: 89.6 kg 120.2 kg 81.2 kg    Examination:  General exam: Appears calm and comfortable.  No acute distress. Respiratory system: Bilateral decreased breath sounds at bases with scattered crackles.  No wheezing Cardiovascular system: S1 & S2 heard, Rate controlled Gastrointestinal system: Abdomen is nondistended, soft and nontender. Normal bowel sounds heard. Extremities: No cyanosis, edema.  Left BKA; right first toe ray amputation.     Data Reviewed: I have personally reviewed following labs and imaging studies  CBC: Recent Labs  Lab 02/01/19 0835 02/03/19 0732 02/04/19 1201 02/05/19 0323  WBC 7.8 12.8* 12.3* 9.9  NEUTROABS  --   --  9.7* 7.8*  HGB 11.7* 12.0* 11.8* 10.7*  HCT 37.3* 37.7* 37.2* 33.9*  MCV 87.1 85.7 86.5 85.8  PLT 165 160 171 167   Basic Metabolic Panel: Recent Labs  Lab 02/01/19 0835 02/03/19 0732 02/04/19 1201 02/05/19 0323  NA 136 133* 132* 133*  K 4.0 4.0 3.8 4.0  CL 96* 96* 95* 96*  CO2 29 28 28 29   GLUCOSE 97 136* 189* 191*  BUN 21* 21 31* 23  CREATININE 0.73 0.71 0.67 0.58*  CALCIUM 9.5 9.1 8.6* 8.4*  MG  --   --   --  1.8   GFR: Estimated Creatinine Clearance: 106.4 mL/min (A) (by C-G formula based  on SCr of 0.58 mg/dL (L)). Liver Function Tests: Recent Labs  Lab 02/04/19 1201  AST 11*  ALT 14  ALKPHOS 77  BILITOT 0.6  PROT 6.6  ALBUMIN 2.5*   No results for input(s): LIPASE, AMYLASE in the last 168 hours. No results for input(s): AMMONIA in the last 168 hours. Coagulation Profile: No results for input(s): INR, PROTIME in the last 168 hours. Cardiac Enzymes: No results for input(s): CKTOTAL, CKMB, CKMBINDEX, TROPONINI in the last 168 hours. BNP (last 3 results) No results for input(s): PROBNP in the last 8760 hours. HbA1C: No results for input(s): HGBA1C in the last 72 hours. CBG: Recent Labs  Lab 02/04/19 1234 02/04/19 1633 02/04/19 2123 02/05/19 0759 02/05/19 1211  GLUCAP 179* 179* 195* 242* 236*   Lipid Profile: No results for input(s): CHOL, HDL, LDLCALC, TRIG, CHOLHDL, LDLDIRECT in the last 72 hours. Thyroid Function Tests: No results for input(s): TSH, T4TOTAL, FREET4, T3FREE, THYROIDAB in the last 72 hours. Anemia Panel: No results for input(s): VITAMINB12, FOLATE, FERRITIN, TIBC, IRON, RETICCTPCT in the last 72 hours. Sepsis Labs: Recent Labs  Lab 02/04/19 1201  PROCALCITON 0.26    Recent Results (from the past 240 hour(s))  Expectorated sputum assessment w rflx to resp cult     Status: None   Collection Time: 02/04/19 10:44 AM  Result Value Ref Range Status   Specimen Description SPUTUM  Final   Special Requests NONE  Final   Sputum evaluation   Final    THIS SPECIMEN IS ACCEPTABLE FOR SPUTUM CULTURE Performed at [facility], [address] [place], [place] [address]    Report Status 02/04/2019 FINAL  Final  Culture, respiratory     Status: None (Preliminary result)   Collection Time: 02/04/19 10:44 AM  Result Value Ref Range Status   Specimen Description SPUTUM  Final   Special Requests NONE Reflexed from [id number]  Final   Gram Stain   Final    ABUNDANT WBC PRESENT, PREDOMINANTLY PMN RARE SQUAMOUS EPITHELIAL CELLS PRESENT RARE GRAM  POSITIVE RODS    Culture   Final    CULTURE REINCUBATED FOR BETTER GROWTH Performed at [facility], [address] [place], [place] [address]    Report Status PENDING  Incomplete  Culture, blood (routine x 2)     Status: None (Preliminary result)   Collection Time: 02/04/19 11:52 AM  Result Value Ref Range Status   Specimen Description BLOOD LEFT ARM  Final   Special Requests   Final    BOTTLES DRAWN AEROBIC ONLY Blood Culture adequate volume   Culture   Final    NO GROWTH < 24 HOURS Performed at [facility], [address] [place], [place] [address]    Report Status PENDING  Incomplete  Culture, blood (routine x 2)     Status: None (Preliminary result)   Collection Time: 02/04/19 12:00 PM  Result Value Ref Range Status   Specimen Description BLOOD LEFT ARM  Final   Special Requests   Final    BOTTLES DRAWN AEROBIC ONLY Blood Culture adequate volume   Culture   Final    NO GROWTH < 24 HOURS Performed at [facility], [address] [place], [place] [address]    Report Status PENDING  Incomplete  Culture, Urine     Status: None   Collection Time: 02/04/19  1:54 PM  Result Value Ref Range Status   Specimen Description URINE, CLEAN CATCH  Final   Special Requests NONE  Final   Culture   Final    NO GROWTH Performed at [facility], [address] [place], [place] [address]    Report Status 02/05/2019 FINAL  Final         Radiology Studies: Mr Lumbar Spine W Wo Contrast  Result Date: 02/04/2019 CLINICAL DATA:  Coccygeal wound infection. EXAM: MRI LUMBAR SPINE WITHOUT AND WITH CONTRAST TECHNIQUE: Multiplanar and multiecho pulse sequences of the lumbar spine were obtained without and with intravenous contrast. CONTRAST:  7 mL Gadavist COMPARISON:  None. FINDINGS: Segmentation: Normal. The lowest disc space is considered to be L5-S1. Alignment:  Normal Vertebrae: There is no bone marrow edema. No compression fracture. No abnormal contrast  enhancement. Normal T1-weighted signal is preserved throughout the visualized spine and sacrum. The coccyx is outside of the field of view. Conus medullaris and cauda equina: The conus medullaris terminates at the L1 level. The cauda equina and conus medullaris are both normal. Paraspinal and other soft tissues: The visualized retroperitoneal organs and paraspinal soft tissues are normal. Disc levels: Sagittal plane imaging includes the T12-L1 disc level through the upper sacrum, with axial imaging of the L1-2 to L5-S1 disc levels. T12-L1: Normal. L1-2: Normal. L2-3: Disc desiccation with small bulge and superimposed central protrusion. No central spinal canal stenosis. No  neural foraminal stenosis. Normal facets. L3-4: Disc desiccation and small central protrusion. No spinal canal or neural foraminal stenosis. L4-5: Minimal disc bulge no spinal canal or neural foraminal stenosis. L5-S1: Central/right subarticular disc protrusion with narrowing of the right lateral recess. No central spinal canal stenosis or neural foraminal stenosis. The visualized portion of the sacrum is normal. IMPRESSION: 1. No evidence of active osteomyelitis of the lumbar spine or sacrum. Please note that the coccyx is outside the field of view. 2. No fluid collection or other focal abnormality of the soft tissues dorsal to the spine. 3. Multilevel mild degenerative disc disease. Mild narrowing of the right L5-S1 lateral recess could cause radicular symptoms in a right S1 distribution. Electronically Signed   By: Deatra Robinson M.D.   On: 02/04/2019 23:06   Dg Chest Port 1 View  Result Date: 02/04/2019 CLINICAL DATA:  Dyspnea. EXAM: PORTABLE CHEST 1 VIEW COMPARISON:  One-view chest x-ray 02/03/2019 FINDINGS: The heart is mildly enlarged. Mild pulmonary vascular congestion is stable. Elevation of the right hemidiaphragm is again noted. There is some volume loss on the right with associated atelectasis. No significant airspace consolidation  is present. A small right effusion is not excluded. IMPRESSION: 1. Mild pulmonary vascular congestion without frank edema. 2. Similar appearance of right basilar atelectasis. Electronically Signed   By: Marin Roberts M.D.   On: 02/04/2019 08:31   Dg Swallowing Func-speech Pathology  Result Date: 02/04/2019 Objective Swallowing Evaluation: Type of Study: MBS-Modified Barium Swallow Study  Patient Details Name: Tymeer Turkovich. MRN: [id number] Date of Birth: 10-17-1956 Today's Date: 02/04/2019 Time: SLP Start Time (ACUTE ONLY): 0920 -SLP Stop Time (ACUTE ONLY): 0931 SLP Time Calculation (min) (ACUTE ONLY): 11 min Past Medical History: Past Medical History: Diagnosis Date  Coronary artery disease   Diabetes mellitus without complication (HCC)   Hyperlipidemia   Hypertension   Osteomyelitis (HCC) 03/25/2017  RT FOOT Past Surgical History: Past Surgical History: Procedure Laterality Date  AMPUTATION Right 03/27/2017  Procedure: 1st Ray Amputation Right Foot;  Surgeon: Nadara Mustard, MD;  Location: [facility] OR;  Service: Orthopedics;  Laterality: Right;  APPENDECTOMY    BELOW KNEE LEG AMPUTATION Left   CARDIAC CATHETERIZATION    CORONARY STENT INTERVENTION  2005  Great toe amputation right.    I&D EXTREMITY Left 12/30/2015  Procedure: IRRIGATION AND DEBRIDEMENT LEFT FOOT, TRANSMETATARSAL AMPUTATION WITH APPLICATION OF ANTIBIOTIC BEADS AND WOUND VAC;  Surgeon: Nadara Mustard, MD;  Location: [facility] OR;  Service: Orthopedics;  Laterality: Left;  IR GASTROSTOMY TUBE MOD SED  12/23/2018  TONSILLECTOMY    TRACHEOSTOMY  11/2018 HPI: 63 year old male coming in with fever cough chest pain and shortness of breath was found to have influenza A. PMH: diabetes mellitus, hypertension.  Intubated multiple times 1/3 to 1/16 with trach placed 1/16. Repeat MBS on 3/28 for possible initiation of po's. This is pt's 3rd MBS. Plan to repeat to determine upgrade initially; on 3/10 concern pt may have aspirated and made NPO.    Subjective: pt is alert and cooperative but needs encouragement Assessment / Plan / Recommendation CHL IP CLINICAL IMPRESSIONS 02/04/2019 Clinical Impression Pt has been decannulated since 3/9 with evidence of incomplete stoma closure indicated by breathy vocal quality. He continues to exhibit moderate-severe pharyngeal dysphagia noteably due to incompetent laryngeal closure and poor retroflexion of epiglottis. Honey thick penetrated vestibule with initial swallow of MBS and expelled with cues for cough and was not penetrated with subsequent trial. Nectar thick entered vestibule with penetration  aspiration score (PAS) of 3, 5 & 7 (did not exit trachea with cough). Chin tuck actually facilitated entrance of barium into trachea and swallow with head in neutral applying pressure to gauze/stoma to increase pressure was ineffective. Mild-mod amount of vallecular and pyriform sinus residue not significantly improved. Recommend pt resume po's of honey thick liquids, texture of Dys 2, multiple swallows and hard coughs/throat clear after every other bite/sip. Pt should continue to receive ST at skilled nursing facility for continued intervention and repeat MBS when appropriate (not before 6-8 weeks). Will continue to follow while here.  SLP Visit Diagnosis Dysphagia, pharyngeal phase (R13.13) Attention and concentration deficit following -- Frontal lobe and executive function deficit following -- Impact on safety and function Severe aspiration risk   CHL IP TREATMENT RECOMMENDATION 02/04/2019 Treatment Recommendations Therapy as outlined in treatment plan below   Prognosis 02/04/2019 Prognosis for Safe Diet Advancement Good Barriers to Reach Goals -- Barriers/Prognosis Comment -- CHL IP DIET RECOMMENDATION 02/04/2019 SLP Diet Recommendations Dysphagia 2 (Fine chop) solids;Honey thick liquids Liquid Administration via Cup;No straw Medication Administration Whole meds with puree Compensations Slow rate;Small sips/bites;Multiple  dry swallows after each bite/sip;Hard cough after swallow;Clear throat intermittently Postural Changes Seated upright at 90 degrees;Remain semi-upright after after feeds/meals (Comment)   CHL IP OTHER RECOMMENDATIONS 02/04/2019 Recommended Consults -- Oral Care Recommendations Oral care BID Other Recommendations --   CHL IP FOLLOW UP RECOMMENDATIONS 02/04/2019 Follow up Recommendations Skilled Nursing facility   [facility] IP FREQUENCY AND DURATION 02/04/2019 Speech Therapy Frequency (ACUTE ONLY) min 2x/week Treatment Duration 2 weeks      CHL IP ORAL PHASE 02/04/2019 Oral Phase WFL Oral - Pudding Teaspoon -- Oral - Pudding Cup -- Oral - Honey Teaspoon -- Oral - Honey Cup WFL Oral - Nectar Teaspoon -- Oral - Nectar Cup WFL Oral - Nectar Straw -- Oral - Thin Teaspoon -- Oral - Thin Cup NT Oral - Thin Straw -- Oral - Puree NT Oral - Mech Soft -- Oral - Regular NT Oral - Multi-Consistency -- Oral - Pill -- Oral Phase - Comment --  CHL IP PHARYNGEAL PHASE 02/04/2019 Pharyngeal Phase Impaired Pharyngeal- Pudding Teaspoon -- Pharyngeal -- Pharyngeal- Pudding Cup -- Pharyngeal -- Pharyngeal- Honey Teaspoon NT Pharyngeal -- Pharyngeal- Honey Cup Penetration/Aspiration during swallow;Pharyngeal residue - valleculae;Pharyngeal residue - pyriform;Reduced airway/laryngeal closure Pharyngeal Material enters airway, remains ABOVE vocal cords and not ejected out Pharyngeal- Nectar Teaspoon NT Pharyngeal -- Pharyngeal- Nectar Cup Penetration/Aspiration during swallow;Reduced airway/laryngeal closure;Pharyngeal residue - valleculae;Pharyngeal residue - pyriform Pharyngeal Material enters airway, passes BELOW cords and not ejected out despite cough attempt by patient;Material enters airway, CONTACTS cords and not ejected out Pharyngeal- Nectar Straw -- Pharyngeal -- Pharyngeal- Thin Teaspoon NT Pharyngeal -- Pharyngeal- Thin Cup NT Pharyngeal -- Pharyngeal- Thin Straw -- Pharyngeal -- Pharyngeal- Puree NT Pharyngeal -- Pharyngeal- Mechanical  Soft -- Pharyngeal -- Pharyngeal- Regular NT Pharyngeal -- Pharyngeal- Multi-consistency -- Pharyngeal -- Pharyngeal- Pill -- Pharyngeal -- Pharyngeal Comment --  CHL IP CERVICAL ESOPHAGEAL PHASE 02/04/2019 Cervical Esophageal Phase WFL Pudding Teaspoon -- Pudding Cup -- Honey Teaspoon -- Honey Cup -- Nectar Teaspoon -- Nectar Cup -- Nectar Straw -- Thin Teaspoon -- Thin Cup -- Thin Straw -- Puree -- Mechanical Soft -- Regular -- Multi-consistency -- Pill -- Cervical Esophageal Comment -- Royce Macadamia 02/04/2019, 10:23 AM Breck Coons Lonell Face.Ed Nurse, children's [phone] Office [phone]                   Scheduled  Meds:  arformoterol  15 mcg Nebulization BID   aspirin  81 mg Per Tube Daily   atorvastatin  40 mg Per Tube q1800   budesonide (PULMICORT) nebulizer solution  0.5 mg Nebulization BID   carvedilol  3.125 mg Per Tube BID WC   collagenase   Topical Daily   enoxaparin (LOVENOX) injection  40 mg Subcutaneous Q24H   famotidine  20 mg Per Tube BID   folic acid  1 mg Per Tube Daily   free water  150 mL Per Tube Q8H   guaiFENesin  5 mL Per Tube Q12H   insulin aspart  0-20 Units Subcutaneous TID WC   insulin detemir  25 Units Subcutaneous BID   multivitamin  15 mL Per Tube Daily   nutrition supplement (JUVEN)  1 packet Per Tube BID BM   tapentadol  50 mg Oral QID   thiamine  100 mg Per Tube Daily   Continuous Infusions:  sodium chloride 10 mL/hr at 01/24/19 2000   ceFEPime (MAXIPIME) IV 2 g (02/05/19 1007)   feeding supplement (OSMOLITE 1.2 CAL)     metronidazole 500 mg (02/05/19 0400)   vancomycin 1,250 mg (02/05/19 0100)     LOS: 69 days        Glade Lloyd, MD [facility] 02/05/2019, 1:31 PM

## 2019-02-05 NOTE — Plan of Care (Signed)
  Problem: Coping: Goal: Level of anxiety will decrease Outcome: Progressing   Problem: Safety: Goal: Ability to remain free from injury will improve Outcome: Progressing   Problem: Skin Integrity: Goal: Risk for impaired skin integrity will decrease Outcome: Progressing   Problem: Education: Goal: Ability to describe self-care measures that may prevent or decrease complications (Diabetes Survival Skills Education) will improve Outcome: Progressing Goal: Individualized Educational Video(s) Outcome: Progressing   Problem: Coping: Goal: Ability to adjust to condition or change in health will improve Outcome: Progressing   Problem: Fluid Volume: Goal: Ability to maintain a balanced intake and output will improve Outcome: Progressing   Problem: Health Behavior/Discharge Planning: Goal: Ability to identify and utilize available resources and services will improve Outcome: Progressing Goal: Ability to manage health-related needs will improve Outcome: Progressing   Problem: Metabolic: Goal: Ability to maintain appropriate glucose levels will improve Outcome: Progressing   Problem: Nutritional: Goal: Maintenance of adequate nutrition will improve Outcome: Progressing Goal: Progress toward achieving an optimal weight will improve Outcome: Progressing   Problem: Skin Integrity: Goal: Risk for impaired skin integrity will decrease Outcome: Progressing   Problem: Tissue Perfusion: Goal: Adequacy of tissue perfusion will improve Outcome: Progressing   Problem: Education: Goal: Knowledge about tracheostomy care/management will improve Outcome: Progressing   Problem: Activity: Goal: Ability to tolerate increased activity will improve Outcome: Progressing   Problem: Health Behavior/Discharge Planning: Goal: Ability to manage tracheostomy will improve Outcome: Progressing   Problem: Respiratory: Goal: Patent airway maintenance will improve Outcome: Progressing    Problem: Role Relationship: Goal: Ability to communicate will improve Outcome: Progressing

## 2019-02-05 NOTE — Progress Notes (Signed)
Nutrition Follow-up  INTERVENTION:   TF via PEG: - Decrease Osmolite 1.2 to 45 ml/hr (1080 ml/day) to stimulate appetite and promote PO intake - Free water per MD (currently 150 ml q 8 hours)  Tube feeding regimen and current free water provides 1296 kcal, 60 grams of protein, and 1336 ml of H2O (meeting 56% of kcal and 50% of protein needs).  - Continue 1 packet Juven BID via PEG, each packet provides 90 calories, 2.5 grams of protein, 8 grams of carbohydrate, and 14 grams of amino acids (contains CaHMB, glutamine, and arginine to promote wound healing)  - Continue liquid MVI daily via PEG  - Magic cup TID with meals, each supplement provides 290 kcal and 9 grams of protein  NUTRITION DIAGNOSIS:   Inadequate oral intake related to inability to eat as evidenced by NPO status.  Progressing, pt now on Dysphagia 2 diet with honey-thick liquids  GOAL:   Patient will meet greater than or equal to 90% of their needs  Progressing  MONITOR:   PO intake, Supplement acceptance, TF tolerance, Weight trends, Skin, I & O's, Labs  REASON FOR ASSESSMENT:   Consult Enteral/tube feeding initiation and management  ASSESSMENT:   63 y/o M who presented to [facility] ([place] 1/3 with fever, cough, chest pain & SOB. Pt found to be Influenza A positive. Developed progressive respiratory distress requiring intubation in the ER.    3/6 - trach capped, MBS with recommendations for Dysphagia 2, honey-thick liquids and TF held 3/9 - decannulated, advanced to Dysphagia 3 with honey-thick liquids 3/10 - NPO due to concern for aspiration 3/11 - MBS with recommendations for Dysphagia 2, honey-thick liquids  Pt currently on hydrotherapy for sacral wound. General surgery following pt and recommended increasing hydrotherapy to daily.  Weights appear inaccurate with weight loss of 86 lbs in 1 day recorded in chart. Prior to that time, pt had been maintaining his weight between 197-206 lbs for the last  month.  Discussed pt with RN and MD. Will decrease rate of TF to stimulate pt's appetite and promote PO intake given pt is no longer NPO. If pt's appetite and PO intake improve, would consider d/c TF altogether. However, hesitant to do that at this time given pt has only consumed tea and pudding since diet advancement yesterday.  Made pt "with assist" in [employer] diet software so that ambassador will come take pt's order due to pt missing breakfast meal because he did not order it.  Returned later to pt's room after discussion with RN regarding elevation of HOB. Pt amenable to RD increasing head of bed to 30 degrees. Reminded pt that he cannot safely receive TF if HOB is below 30. Pt reports that he does sit up to eat.  Current TF: Osmolite 1.2 @ 75 ml/hr, free water 150 ml q 8 hours  Medications reviewed and include: Pepcid, folic acid, SSI, Levemir 25 units BID, liquid MVI, Juven BID, thiamine, IV antibiotics  Labs reviewed: sodium 133 (L), creatinine 0.58 (L) CBG's: 242, 195, 179 x 24 hours  UOP: 850 ml x 24 hours I/O's: +48.2 L since admit per flowsheets  Diet Order:   Diet Order            DIET DYS 2 Room service appropriate? 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  Estimated Nutritional Needs:   Kcal:  2300-2500  Protein:  120-150 grams  Fluid:  > 2 L/day    Earma Reading, MS, RD, LDN Inpatient Clinical Dietitian Pager: [phone] Weekend/After Hours: [phone]

## 2019-02-05 NOTE — Progress Notes (Signed)
PT Cancellation Note  Patient Details Name: Antonio [name]. MRN: [id number] DOB: [birth year]   Cancelled Treatment:    Reason Eval/Treat Not Completed: Patient declined, no reason specified.  Pt stated he got up earlier, got sick and is not going to get up. 02/05/2019  Morris Bing, PT Acute Rehabilitation Services [phone]  (pager) [phone]  (office)   Eliseo Gum Merin Borjon 02/05/2019, 12:13 PM

## 2019-02-06 LAB — GLUCOSE, CAPILLARY
Glucose-Capillary: 188 mg/dL — ABNORMAL HIGH (ref 70–99)
Glucose-Capillary: 169 mg/dL — ABNORMAL HIGH (ref 70–99)
Glucose-Capillary: 136 mg/dL — ABNORMAL HIGH (ref 70–99)
Glucose-Capillary: 152 mg/dL — ABNORMAL HIGH (ref 70–99)

## 2019-02-06 LAB — CBC WITH DIFFERENTIAL/PLATELET
Abs Immature Granulocytes: 0.03 10*3/uL (ref 0.00–0.07)
Basophils Absolute: 0 10*3/uL (ref 0.0–0.1)
Basophils Relative: 0 %
Eosinophils Absolute: 0.2 10*3/uL (ref 0.0–0.5)
Eosinophils Relative: 2 %
HCT: 29.8 % — ABNORMAL LOW (ref 39.0–52.0)
Hemoglobin: 9.2 g/dL — ABNORMAL LOW (ref 13.0–17.0)
Immature Granulocytes: 0 %
Lymphocytes Relative: 15 %
Lymphs Abs: 1.4 10*3/uL (ref 0.7–4.0)
MCH: 26.4 pg (ref 26.0–34.0)
MCHC: 30.9 g/dL (ref 30.0–36.0)
MCV: 85.4 fL (ref 80.0–100.0)
Monocytes Absolute: 0.8 10*3/uL (ref 0.1–1.0)
Monocytes Relative: 8 %
NRBC: 0 % (ref 0.0–0.2)
Neutro Abs: 6.6 10*3/uL (ref 1.7–7.7)
Neutrophils Relative %: 75 %
Platelets: 170 10*3/uL (ref 150–400)
RBC: 3.49 MIL/uL — ABNORMAL LOW (ref 4.22–5.81)
RDW: 15.9 % — AB (ref 11.5–15.5)
WBC: 9 10*3/uL (ref 4.0–10.5)

## 2019-02-06 LAB — BASIC METABOLIC PANEL
Anion gap: 8 (ref 5–15)
BUN: 17 mg/dL (ref 8–23)
CALCIUM: 8.1 mg/dL — AB (ref 8.9–10.3)
CO2: 27 mmol/L (ref 22–32)
Chloride: 98 mmol/L (ref 98–111)
Creatinine, Ser: 0.47 mg/dL — ABNORMAL LOW (ref 0.61–1.24)
GFR calc Af Amer: >60 mL/min (ref >60)
GFR calc non Af Amer: >60 mL/min (ref >60)
Glucose, Bld: 175 mg/dL — ABNORMAL HIGH (ref 70–99)
Potassium: 3.9 mmol/L (ref 3.5–5.1)
Sodium: 133 mmol/L — ABNORMAL LOW (ref 135–145)

## 2019-02-06 LAB — MAGNESIUM: Magnesium: 1.8 mg/dL (ref 1.7–2.4)

## 2019-02-06 NOTE — Progress Notes (Signed)
Patient ID: Antonio [name]., male   DOB: [birth year], 63 y.o.   MRN: [id number]  PROGRESS NOTE    Antonio [name].  VHQ:[id number] DOB: [birth year] DOA: 11/28/2018 PCP: Dema Severin, NP   Brief Narrative:  63 year old male was admitted on 11/28/2018 with influenza A causing respiratory failure and cardiogenic shock.  Patient has had prolonged hospitalization with mechanical ventilation and subsequent tracheostomy placed on December 11, 2018.  He was treated with broad-spectrum antibiotics for his Pseudomonas from his sputum culture.  He was switched to cuffless trach on 01/27/2019.  He also had PEG tube placement.  Significant events: 1/6 self extubated  1/7 re-intubated  1/8 self extubated 1/9 acute respiratory failure, mucus plugging right lower lobe, re-intubated, bronchoscopy 1/10 New fever T Max 103.1, antibiotics resumed with ceftaz and vancomycin 1/11 Hemodynamically seems to be improving. Very hypernatremic. X-ray improving. Adding free water, one-time Lasix, follow-up chemistry a.m. Hoping to initiate weaning efforts on 1/12 1/16 Continuing diuresis and ongoing ventilatory support. Working on weaning but still has significant work of breathing. Plan has been to proceed with tracheostomy. Hopefully we can continue ongoing diuresis and continue to decrease sedation 1/20 still struggling to wean. Secretions and anxiety are barriers.  1/21 trach revision to 6 distal XLT, fever, increased secretions, abx broadened / bronch 1/22 ongoing thick secretions 1/24 Failed SBT, low grade fever, Per CXR persistent pleural effusions / basilar consolidation atelectasis vs pneumonia per bases 1/25 Mucus plugged with slow recovery after chest PT and Wound Care 2/3  first time weaned for greater than 15 minutes (previously failed due to agitation, secretions, tachypnea) 2/10 TRH assumed care 2/11 trach changed to a #5 cuffless per PCCM  2/13 trach changed to #4 shiley cuffless  2/13  afternoon - pulled out new trach and "disoldged" PEG - both reinserted/repositioned  2/13 PM > 2/14 AM acute hypoxic failure - emergent bedside bronch > trach obstructed - #6 cuffed trach placed > back to ICU on vent  2/14 trach changed to #5 cuffed 2/15 TRH resumed care - resp distress > back on vent  2/15 Failed ATC with increased WOB 2/16 Failed ATC with increased WOB  2/21 back on pressure support 2/24 back on TC  01/27/19 - 5  XLT cuffless trach   Assessment & Plan:   Active Problems:   Chest pain   SIRS (systemic inflammatory response syndrome) (HCC)   Acute respiratory failure with hypoxemia (HCC)   Pressure injury of skin   Influenza A virus present   Pneumonia   Tracheostomy status (HCC)   Acute pulmonary edema (HCC)   Hypoxia   Fever, unspecified   Tachycardia   Acute on chronic respiratory failure (HCC)   Hypoxemia   Phlegm in throat   Dysphagia   Status post tracheostomy (HCC)   HCAP (healthcare-associated pneumonia)   Diabetes mellitus type 2 in nonobese (HCC)   Essential hypertension   S/P BKA (below knee amputation) unilateral, left (HCC)   Tachypnea   Pain   Agitation   Acute blood loss anemia   Acute respiratory failure (HCC)   Airway trauma  Fever -Temperature max of 100.7 yesterday morning around 7:00.  Cultures negative so far.  Chest x-ray was negative for infiltrates.  -Currently on cefepime, vancomycin and Flagyl.  Will DC vancomycin and Flagyl.  No evidence of MRSA infection. -If patient continues to spike temperatures, might have to get CT of the chest abdomen and pelvis.    Leukocytosis -Resolved.  Acute hypoxic respiratory  failure secondary to influenza A -Patient was under the care of ICU, required multiple intubations and subsequently tracheostomy -Sputum culture from trach had grown Corynebacterium stratum and Pseudomonas recently.  He was treated with ceftazidime for the same. -He has been switched to cuffless trach on 01/27/2019 by  pulmonary.  Pulmonary following intermittently.  Sacral/coccygeal stage IV pressure injury -Wound care evaluation appreciated.   -General surgery evaluation has been appreciated: No need for any surgical debridement at this time.  Continue wound care and hydrotherapy.  MRI of the spine was negative for any evidence of osteomyelitis in the lumbosacral spine.  Will discontinue vancomycin and Flagyl as above.  Acute systolic heart failure -EF was 20 to 25% on echo on 11/29/2018 but improved to 60 to 65% on 12/24/2018. -Most likely secondary to sepsis -No plans for cardiac cath per cardiology -Continue beta-blocker.  Continue strict input and output.  Dysphagia -Status post PEG tube placement.  Continue PEG tube feeding. -SLP following the patient and is recommending dysphagia 2 diet.  Patient will need repeat MBS as an outpatient not before 6 to 8 weeks.  Acute metabolic encephalopathy -Resolved  Deconditioning - will need SNF placement.  Social worker following  Diabetes mellitus type 2 -Monitor CBGs.  Continue Levemir with sliding scale insulin  DVT prophylaxis: Lovenox Code Status: Full code Family Communication: None Disposition Plan: SNF in the next few days if remains afebrile.  Consultants: PCCM/cardiology/general surgery  Procedures:  As described above  Antimicrobials:  Anti-infectives (From admission, onward)   Start     Dose/Rate Route Frequency Ordered Stop   02/04/19 1300  vancomycin (VANCOCIN) 1,250 mg in sodium chloride 0.9 % 250 mL IVPB     1,250 mg 166.7 mL/hr over 90 Minutes Intravenous Every 12 hours 02/04/19 1200     02/04/19 1200  metroNIDAZOLE (FLAGYL) IVPB 500 mg     500 mg 100 mL/hr over 60 Minutes Intravenous Every 8 hours 02/04/19 1146     02/04/19 0930  ceFEPIme (MAXIPIME) 2 g in sodium chloride 0.9 % 100 mL IVPB     2 g 200 mL/hr over 30 Minutes Intravenous Every 8 hours 02/04/19 0926     02/03/19 1200  meropenem (MERREM) 1 g in sodium chloride 0.9  % 100 mL IVPB  Status:  Discontinued     1 g 200 mL/hr over 30 Minutes Intravenous Every 8 hours 02/03/19 1054 02/04/19 0924   01/19/19 1400  cefTAZidime (FORTAZ) 2 g in sodium chloride 0.9 % 100 mL IVPB     2 g 200 mL/hr over 30 Minutes Intravenous Every 8 hours 01/19/19 1120 01/24/19 2359   01/15/19 1500  cefTAZidime (FORTAZ) 2 g in sodium chloride 0.9 % 100 mL IVPB     2 g 200 mL/hr over 30 Minutes Intravenous Every 8 hours 01/15/19 1448 01/19/19 0542   01/14/19 1400  colistimethate ((COLYMYCIN-M)) nebulized solution 150 mg  Status:  Discontinued     150 mg Inhalation 3 times daily 01/14/19 1142 01/19/19 1119   01/14/19 1300  meropenem (MERREM) 1 g in sodium chloride 0.9 % 100 mL IVPB  Status:  Discontinued     1 g 200 mL/hr over 30 Minutes Intravenous Every 8 hours 01/14/19 1235 01/15/19 1448   12/23/18 1506  vancomycin (VANCOCIN) 1-5 GM/200ML-% IVPB    Note to Pharmacy:  [facility]   : cabinet override      12/23/18 1506 12/23/18 1515   12/20/18 2200  vancomycin (VANCOCIN) IVPB 1000 mg/200 mL premix  Status:  Discontinued     1,000 mg 200 mL/hr over 60 Minutes Intravenous Every 12 hours 12/20/18 1804 12/22/18 1413   12/18/18 0800  ceFEPIme (MAXIPIME) 1 g in sodium chloride 0.9 % 100 mL IVPB  Status:  Discontinued     1 g 200 mL/hr over 30 Minutes Intravenous Every 8 hours 12/18/18 0713 12/19/18 1201   12/17/18 0530  vancomycin (VANCOCIN) 1,500 mg in sodium chloride 0.9 % 500 mL IVPB  Status:  Discontinued     1,500 mg 250 mL/hr over 120 Minutes Intravenous Every 12 hours 12/16/18 1704 12/20/18 1804   12/16/18 1730  cefTAZidime (FORTAZ) 2 g in sodium chloride 0.9 % 100 mL IVPB  Status:  Discontinued     2 g 200 mL/hr over 30 Minutes Intravenous Every 8 hours 12/16/18 1704 12/18/18 0702   12/16/18 1730  vancomycin (VANCOCIN) 2,000 mg in sodium chloride 0.9 % 500 mL IVPB     2,000 mg 250 mL/hr over 120 Minutes Intravenous  Once 12/16/18 1704 12/16/18 2030   12/07/18 1300   erythromycin 500 mg in sodium chloride 0.9 % 100 mL IVPB  Status:  Discontinued     500 mg 100 mL/hr over 60 Minutes Intravenous Every 6 hours 12/07/18 1118 12/16/18 1627   12/06/18 1500  vancomycin (VANCOCIN) 1,250 mg in sodium chloride 0.9 % 250 mL IVPB  Status:  Discontinued     1,250 mg 166.7 mL/hr over 90 Minutes Intravenous Every 12 hours 12/06/18 1436 12/07/18 1118   12/06/18 0100  vancomycin (VANCOCIN) 1,250 mg in sodium chloride 0.9 % 250 mL IVPB     1,250 mg 166.7 mL/hr over 90 Minutes Intravenous  Once 12/05/18 1054 12/06/18 0146   12/05/18 1200  vancomycin (VANCOCIN) 2,000 mg in sodium chloride 0.9 % 500 mL IVPB     2,000 mg 250 mL/hr over 120 Minutes Intravenous  Once 12/05/18 1054 12/05/18 1350   12/05/18 1200  cefTAZidime (FORTAZ) 1 g in sodium chloride 0.9 % 100 mL IVPB  Status:  Discontinued     1 g 200 mL/hr over 30 Minutes Intravenous Every 8 hours 12/05/18 1106 12/07/18 1118   11/30/18 2200  oseltamivir (TAMIFLU) 6 MG/ML suspension 30 mg     30 mg Per Tube 2 times daily 11/30/18 1348 12/03/18 2159   11/30/18 2200  ceFEPIme (MAXIPIME) 1 g in sodium chloride 0.9 % 100 mL IVPB  Status:  Discontinued     1 g 200 mL/hr over 30 Minutes Intravenous Every 12 hours 11/30/18 1350 12/01/18 1039   11/29/18 2200  ceFEPIme (MAXIPIME) 2 g in sodium chloride 0.9 % 100 mL IVPB  Status:  Discontinued     2 g 200 mL/hr over 30 Minutes Intravenous Every 12 hours 11/29/18 1645 11/30/18 1350   11/29/18 1100  oseltamivir (TAMIFLU) 6 MG/ML suspension 75 mg  Status:  Discontinued     75 mg Per Tube 2 times daily 11/29/18 1000 11/30/18 1348   11/28/18 2300  oseltamivir (TAMIFLU) capsule 75 mg  Status:  Discontinued     75 mg Oral 2 times daily 11/28/18 2238 11/29/18 1000   11/28/18 2030  oseltamivir (TAMIFLU) capsule 75 mg     75 mg Oral  Once 11/28/18 2029 11/28/18 2207   11/28/18 1745  vancomycin (VANCOCIN) 2,000 mg in sodium chloride 0.9 % 500 mL IVPB     2,000 mg 250 mL/hr over 120  Minutes Intravenous  Once 11/28/18 1741 11/28/18 2203   11/28/18 1745  piperacillin-tazobactam (ZOSYN) IVPB  3.375 g     3.375 g 100 mL/hr over 30 Minutes Intravenous  Once 11/28/18 1741 11/28/18 1834      Subjective: Patient seen and examined at bedside.  Denies any overnight fever, nausea or vomiting.  No abdominal pain or diarrhea.   Objective: Vitals:   02/06/19 0549 02/06/19 0700 02/06/19 0806 02/06/19 0810  BP: 129/69     Pulse: 82     Resp: 19     Temp: 98.9 F (37.2 C) 98.2 F (36.8 C)    TempSrc: Oral     SpO2: 95%  97% 97%  Weight:      Height:        Intake/Output Summary (Last 24 hours) at 02/06/2019 0955 Last data filed at 02/06/2019 0700 Gross per 24 hour  Intake 830 ml  Output 250 ml  Net 580 ml   Filed Weights   02/02/19 0257 02/03/19 0554 02/04/19 0547  Weight: 89.6 kg 120.2 kg 81.2 kg    Examination:  General exam: Appears calm and comfortable.  No distress. Respiratory system: Bilateral decreased breath sounds at bases with scattered crackles.   Cardiovascular system: Rate controlled, S1-S2 heard Gastrointestinal system: Abdomen is nondistended, soft and nontender. Normal bowel sounds heard. Extremities: No cyanosis, edema.  Left BKA; right first toe ray amputation.     Data Reviewed: I have personally reviewed following labs and imaging studies  CBC: Recent Labs  Lab 02/01/19 0835 02/03/19 0732 02/04/19 1201 02/05/19 0323 02/06/19 0413  WBC 7.8 12.8* 12.3* 9.9 9.0  NEUTROABS  --   --  9.7* 7.8* 6.6  HGB 11.7* 12.0* 11.8* 10.7* 9.2*  HCT 37.3* 37.7* 37.2* 33.9* 29.8*  MCV 87.1 85.7 86.5 85.8 85.4  PLT 165 160 171 167 170   Basic Metabolic Panel: Recent Labs  Lab 02/01/19 0835 02/03/19 0732 02/04/19 1201 02/05/19 0323 02/06/19 0413  NA 136 133* 132* 133* 133*  K 4.0 4.0 3.8 4.0 3.9  CL 96* 96* 95* 96* 98  CO2 29 28 28 29 27   GLUCOSE 97 136* 189* 191* 175*  BUN 21* 21 31* 23 17  CREATININE 0.73 0.71 0.67 0.58* 0.47*  CALCIUM  9.5 9.1 8.6* 8.4* 8.1*  MG  --   --   --  1.8 1.8   GFR: Estimated Creatinine Clearance: 106.4 mL/min (A) (by C-G formula based on SCr of 0.47 mg/dL (L)). Liver Function Tests: Recent Labs  Lab 02/04/19 1201  AST 11*  ALT 14  ALKPHOS 77  BILITOT 0.6  PROT 6.6  ALBUMIN 2.5*   No results for input(s): LIPASE, AMYLASE in the last 168 hours. No results for input(s): AMMONIA in the last 168 hours. Coagulation Profile: No results for input(s): INR, PROTIME in the last 168 hours. Cardiac Enzymes: No results for input(s): CKTOTAL, CKMB, CKMBINDEX, TROPONINI in the last 168 hours. BNP (last 3 results) No results for input(s): PROBNP in the last 8760 hours. HbA1C: No results for input(s): HGBA1C in the last 72 hours. CBG: Recent Labs  Lab 02/05/19 0759 02/05/19 1211 02/05/19 1714 02/05/19 2056 02/06/19 0855  GLUCAP 242* 236* 146* 148* 188*   Lipid Profile: No results for input(s): CHOL, HDL, LDLCALC, TRIG, CHOLHDL, LDLDIRECT in the last 72 hours. Thyroid Function Tests: No results for input(s): TSH, T4TOTAL, FREET4, T3FREE, THYROIDAB in the last 72 hours. Anemia Panel: No results for input(s): VITAMINB12, FOLATE, FERRITIN, TIBC, IRON, RETICCTPCT in the last 72 hours. Sepsis Labs: Recent Labs  Lab 02/04/19 1201  PROCALCITON 0.26  Recent Results (from the past 240 hour(s))  Expectorated sputum assessment w rflx to resp cult     Status: None   Collection Time: 02/04/19 10:44 AM  Result Value Ref Range Status   Specimen Description SPUTUM  Final   Special Requests NONE  Final   Sputum evaluation   Final    THIS SPECIMEN IS ACCEPTABLE FOR SPUTUM CULTURE Performed at [facility], [address], [place], [place] [address]    Report Status 02/04/2019 FINAL  Final  Culture, respiratory     Status: None (Preliminary result)   Collection Time: 02/04/19 10:44 AM  Result Value Ref Range Status   Specimen Description SPUTUM  Final   Special Requests NONE Reflexed  from [id number]  Final   Gram Stain   Final    ABUNDANT WBC PRESENT, PREDOMINANTLY PMN RARE SQUAMOUS EPITHELIAL CELLS PRESENT RARE GRAM POSITIVE RODS    Culture   Final    CULTURE REINCUBATED FOR BETTER GROWTH Performed at [facility], [address] [place], [place] [address]    Report Status PENDING  Incomplete  Culture, blood (routine x 2)     Status: None (Preliminary result)   Collection Time: 02/04/19 11:52 AM  Result Value Ref Range Status   Specimen Description BLOOD LEFT ARM  Final   Special Requests   Final    BOTTLES DRAWN AEROBIC ONLY Blood Culture adequate volume   Culture   Final    NO GROWTH 1 DAY Performed at [facility], [address], [place], [place] [address]    Report Status PENDING  Incomplete  Culture, blood (routine x 2)     Status: None (Preliminary result)   Collection Time: 02/04/19 12:00 PM  Result Value Ref Range Status   Specimen Description BLOOD LEFT ARM  Final   Special Requests   Final    BOTTLES DRAWN AEROBIC ONLY Blood Culture adequate volume   Culture   Final    NO GROWTH 1 DAY Performed at [facility], [address], [place], [place] [address]    Report Status PENDING  Incomplete  Culture, Urine     Status: None   Collection Time: 02/04/19  1:54 PM  Result Value Ref Range Status   Specimen Description URINE, CLEAN CATCH  Final   Special Requests NONE  Final   Culture   Final    NO GROWTH Performed at [facility], [address] [place], [place] [address]    Report Status 02/05/2019 FINAL  Final         Radiology Studies: Mr Lumbar Spine W Wo Contrast  Result Date: 02/04/2019 CLINICAL DATA:  Coccygeal wound infection. EXAM: MRI LUMBAR SPINE WITHOUT AND WITH CONTRAST TECHNIQUE: Multiplanar and multiecho pulse sequences of the lumbar spine were obtained without and with intravenous contrast. CONTRAST:  7 mL Gadavist COMPARISON:  None. FINDINGS: Segmentation: Normal. The lowest disc space is considered  to be L5-S1. Alignment:  Normal Vertebrae: There is no bone marrow edema. No compression fracture. No abnormal contrast enhancement. Normal T1-weighted signal is preserved throughout the visualized spine and sacrum. The coccyx is outside of the field of view. Conus medullaris and cauda equina: The conus medullaris terminates at the L1 level. The cauda equina and conus medullaris are both normal. Paraspinal and other soft tissues: The visualized retroperitoneal organs and paraspinal soft tissues are normal. Disc levels: Sagittal plane imaging includes the T12-L1 disc level through the upper sacrum, with axial  imaging of the L1-2 to L5-S1 disc levels. T12-L1: Normal. L1-2: Normal. L2-3: Disc desiccation with small bulge and superimposed central protrusion. No central spinal canal stenosis. No neural foraminal stenosis. Normal facets. L3-4: Disc desiccation and small central protrusion. No spinal canal or neural foraminal stenosis. L4-5: Minimal disc bulge no spinal canal or neural foraminal stenosis. L5-S1: Central/right subarticular disc protrusion with narrowing of the right lateral recess. No central spinal canal stenosis or neural foraminal stenosis. The visualized portion of the sacrum is normal. IMPRESSION: 1. No evidence of active osteomyelitis of the lumbar spine or sacrum. Please note that the coccyx is outside the field of view. 2. No fluid collection or other focal abnormality of the soft tissues dorsal to the spine. 3. Multilevel mild degenerative disc disease. Mild narrowing of the right L5-S1 lateral recess could cause radicular symptoms in a right S1 distribution. Electronically Signed   By: Deatra Robinson M.D.   On: 02/04/2019 23:06        Scheduled Meds: . arformoterol  15 mcg Nebulization BID  . aspirin  81 mg Per Tube Daily  . atorvastatin  40 mg Per Tube q1800  . budesonide (PULMICORT) nebulizer solution  0.5 mg Nebulization BID  . carvedilol  3.125 mg Per Tube BID WC  . collagenase    Topical Daily  . enoxaparin (LOVENOX) injection  40 mg Subcutaneous Q24H  . famotidine  20 mg Per Tube BID  . folic acid  1 mg Per Tube Daily  . guaiFENesin  5 mL Per Tube Q12H  . insulin aspart  0-20 Units Subcutaneous TID WC  . insulin detemir  25 Units Subcutaneous BID  . multivitamin  15 mL Per Tube Daily  . nutrition supplement (JUVEN)  1 packet Per Tube BID BM  . tapentadol  50 mg Oral QID  . thiamine  100 mg Per Tube Daily   Continuous Infusions: . sodium chloride 10 mL/hr at 01/24/19 2000  . ceFEPime (MAXIPIME) IV 2 g (02/06/19 0922)  . feeding supplement (OSMOLITE 1.2 CAL) 1,000 mL (02/05/19 2032)  . metronidazole 500 mg (02/06/19 0436)  . vancomycin 1,250 mg (02/06/19 0017)     LOS: 70 days        Glade Lloyd, MD [facility] 02/06/2019, 9:55 AM

## 2019-02-06 NOTE — Progress Notes (Signed)
Physical Therapy Wound Treatment Patient Details  Name: Antonio [name]. MRN: [id number] Date of Birth: 26-Jan-1956  Today's Date: 02/06/2019 Time:  -     Subjective  Subjective: Pt agreeable Patient and Family Stated Goals: "go home." Date of Onset: (unknown) Prior Treatments: dressing changes  Pain Score:  Pain with pulsatile lavage and debridement; Pre medicated.   Wound Assessment  Pressure Injury 12/01/18 Unstageable - Full thickness tissue loss in which the base of the ulcer is covered by slough (yellow, tan, gray, green or brown) and/or eschar (tan, brown or black) in the wound bed. sacrum (Active)  Dressing Type Barrier Film (skin prep);ABD;Moist to moist;Gauze (Comment);Other (Comment) 02/06/2019  4:51 PM  Dressing Clean;Dry;Intact;Changed 02/06/2019  4:51 PM  Dressing Change Frequency Daily 02/06/2019  4:51 PM  State of Healing Early/partial granulation 02/06/2019  4:51 PM  Site / Wound Assessment Red;Yellow;Pink 02/06/2019  4:51 PM  % Wound base Red or Granulating 60% 02/06/2019  4:51 PM  % Wound base Yellow/Fibrinous Exudate 30% 02/06/2019  4:51 PM  % Wound base Black/Eschar 10% 02/06/2019  4:51 PM  % Wound base Other/Granulation Tissue (Comment) 0% 02/06/2019  4:51 PM  Peri-wound Assessment Erythema (blanchable);Pink 02/06/2019  4:51 PM  Wound Length (cm) 9 cm 02/04/2019 12:12 PM  Wound Width (cm) 5 cm 02/04/2019 12:12 PM  Wound Depth (cm) 2 cm 02/04/2019 12:12 PM  Wound Surface Area (cm^2) 45 cm^2 02/04/2019 12:12 PM  Wound Volume (cm^3) 90 cm^3 02/04/2019 12:12 PM  Tunneling (cm) 0 12/31/2018 10:28 AM  Margins Unattached edges (unapproximated) 02/06/2019  4:51 PM  Drainage Amount Minimal 02/06/2019  4:51 PM  Drainage Description Serosanguineous 02/06/2019  4:51 PM  Treatment Debridement (Selective);Hydrotherapy (Pulse lavage);Packing (Saline gauze) 02/06/2019  4:51 PM     Hydrotherapy Pulsed lavage therapy - wound location: sacrum Pulsed Lavage with Suction (psi): 8  psi(8-12) Pulsed Lavage with Suction - Normal Saline Used: 1000 mL Pulsed Lavage Tip: Tip with splash shield Selective Debridement Selective Debridement - Location: sacrum Selective Debridement - Tools Used: Forceps;Scissors Selective Debridement - Tissue Removed: yellow necrotic tissue, eschar   Wound Assessment and Plan  Wound Therapy - Assess/Plan/Recommendations Wound Therapy - Clinical Statement: Minimal yellow slough removed from superior opening of wound. Education provided to pt on pressure relief schedule. Surgery recommending increased frequency to M-Sa hydrotherapy Wound Therapy - Functional Problem List: decr mobility Factors Delaying/Impairing Wound Healing: Diabetes Mellitus;Immobility;Multiple medical problems Hydrotherapy Plan: Debridement;Dressing change;Patient/family education;Pulsatile lavage with suction Wound Therapy - Frequency: 3X / week Wound Therapy - Follow Up Recommendations: Skilled nursing facility Wound Plan: see above  Wound Therapy Goals- Improve the function of patient's integumentary system by progressing the wound(s) through the phases of wound healing (inflammation - proliferation - remodeling) by: Decrease Necrotic Tissue to: 35 Decrease Necrotic Tissue - Progress: Revised due to lack of progress Increase Granulation Tissue to: 65 Increase Granulation Tissue - Progress: Revised due to lack of progress Goals/treatment plan/discharge plan were made with and agreed upon by patient/family: Yes Time For Goal Achievement: 7 days Wound Therapy - Potential for Goals: Fair  Goals will be updated until maximal potential achieved or discharge criteria met.  Discharge criteria: when goals achieved, discharge from hospital, MD decision/surgical intervention, no progress towards goals, refusal/missing three consecutive treatments without notification or medical reason.  GP  [name], PT, DPT Acute Rehabilitation Services Pager [phone] Office  [phone]      [name] 02/06/2019, 4:57 PM

## 2019-02-06 NOTE — Progress Notes (Signed)
Subjective: CC: sacral pain Complains of pain in his sacral region. No abdominal pain, n/v.   Objective: Vital signs in last 24 hours: Temp:  [98.2 F (36.8 C)-99.4 F (37.4 C)] 98.2 F (36.8 C) (03/13 0700) Pulse Rate:  [82-86] 82 (03/13 0549) Resp:  [19-26] 19 (03/13 0549) BP: (129-143)/(66-91) 129/69 (03/13 0549) SpO2:  [91 %-97 %] 97 % (03/13 0810) Last BM Date: 02/05/19  Intake/Output from previous day: 03/12 0701 - 03/13 0700 In: 830 [P.O.:830] Out: 250 [Urine:250] Intake/Output this shift: No intake/output data recorded.  PE: Gen: Ill appearing, getting hydrotherapy Heart: Regular Lungs: Normal effort Abd: Soft, NT, ND, +BS, peg tube in place GU: Sacral wound as noted in picture below. From points of greatest diameter this measures 9cm x 7cm. There is a 2cm x 3cm opening superiorly that tracts 3cm deep with palpable likely bone as seen in the picture below. There is a clear draining fluid area with slough at 10 o'clock orientation within opening. There is a 2cm edge that appears to have necrotic tissue superiorly. There is no purulence. The area is tender to touch.       Lab Results:  Recent Labs    02/05/19 0323 02/06/19 0413  WBC 9.9 9.0  HGB 10.7* 9.2*  HCT 33.9* 29.8*  PLT 167 170   BMET Recent Labs    02/05/19 0323 02/06/19 0413  NA 133* 133*  K 4.0 3.9  CL 96* 98  CO2 29 27  GLUCOSE 191* 175*  BUN 23 17  CREATININE 0.58* 0.47*  CALCIUM 8.4* 8.1*   PT/INR No results for input(s): LABPROT, INR in the last 72 hours. CMP     Component Value Date/Time   NA 133 (L) 02/06/2019 0413   K 3.9 02/06/2019 0413   CL 98 02/06/2019 0413   CO2 27 02/06/2019 0413   GLUCOSE 175 (H) 02/06/2019 0413   BUN 17 02/06/2019 0413   CREATININE 0.47 (L) 02/06/2019 0413   CALCIUM 8.1 (L) 02/06/2019 0413   PROT 6.6 02/04/2019 1201   ALBUMIN 2.5 (L) 02/04/2019 1201   AST 11 (L) 02/04/2019 1201   ALT 14 02/04/2019 1201   ALKPHOS 77 02/04/2019 1201   BILITOT 0.6 02/04/2019 1201   GFRNONAA >60 02/06/2019 0413   GFRAA >60 02/06/2019 0413   Lipase     Component Value Date/Time   LIPASE 24 11/30/2018 0244       Studies/Results: Mr Lumbar Spine W Wo Contrast  Result Date: 02/04/2019 CLINICAL DATA:  Coccygeal wound infection. EXAM: MRI LUMBAR SPINE WITHOUT AND WITH CONTRAST TECHNIQUE: Multiplanar and multiecho pulse sequences of the lumbar spine were obtained without and with intravenous contrast. CONTRAST:  7 mL Gadavist COMPARISON:  None. FINDINGS: Segmentation: Normal. The lowest disc space is considered to be L5-S1. Alignment:  Normal Vertebrae: There is no bone marrow edema. No compression fracture. No abnormal contrast enhancement. Normal T1-weighted signal is preserved throughout the visualized spine and sacrum. The coccyx is outside of the field of view. Conus medullaris and cauda equina: The conus medullaris terminates at the L1 level. The cauda equina and conus medullaris are both normal. Paraspinal and other soft tissues: The visualized retroperitoneal organs and paraspinal soft tissues are normal. Disc levels: Sagittal plane imaging includes the T12-L1 disc level through the upper sacrum, with axial imaging of the L1-2 to L5-S1 disc levels. T12-L1: Normal. L1-2: Normal. L2-3: Disc desiccation with small bulge and superimposed central protrusion. No central spinal canal stenosis. No  neural foraminal stenosis. Normal facets. L3-4: Disc desiccation and small central protrusion. No spinal canal or neural foraminal stenosis. L4-5: Minimal disc bulge no spinal canal or neural foraminal stenosis. L5-S1: Central/right subarticular disc protrusion with narrowing of the right lateral recess. No central spinal canal stenosis or neural foraminal stenosis. The visualized portion of the sacrum is normal. IMPRESSION: 1. No evidence of active osteomyelitis of the lumbar spine or sacrum. Please note that the coccyx is outside the field of view. 2. No fluid  collection or other focal abnormality of the soft tissues dorsal to the spine. 3. Multilevel mild degenerative disc disease. Mild narrowing of the right L5-S1 lateral recess could cause radicular symptoms in a right S1 distribution. Electronically Signed   By: Deatra Robinson M.D.   On: 02/04/2019 23:06    Anti-infectives: Anti-infectives (From admission, onward)   Start     Dose/Rate Route Frequency Ordered Stop   02/04/19 1300  vancomycin (VANCOCIN) 1,250 mg in sodium chloride 0.9 % 250 mL IVPB  Status:  Discontinued     1,250 mg 166.7 mL/hr over 90 Minutes Intravenous Every 12 hours 02/04/19 1200 02/06/19 1002   02/04/19 1200  metroNIDAZOLE (FLAGYL) IVPB 500 mg  Status:  Discontinued     500 mg 100 mL/hr over 60 Minutes Intravenous Every 8 hours 02/04/19 1146 02/06/19 1002   02/04/19 0930  ceFEPIme (MAXIPIME) 2 g in sodium chloride 0.9 % 100 mL IVPB     2 g 200 mL/hr over 30 Minutes Intravenous Every 8 hours 02/04/19 0926     02/03/19 1200  meropenem (MERREM) 1 g in sodium chloride 0.9 % 100 mL IVPB  Status:  Discontinued     1 g 200 mL/hr over 30 Minutes Intravenous Every 8 hours 02/03/19 1054 02/04/19 0924   01/19/19 1400  cefTAZidime (FORTAZ) 2 g in sodium chloride 0.9 % 100 mL IVPB     2 g 200 mL/hr over 30 Minutes Intravenous Every 8 hours 01/19/19 1120 01/24/19 2359   01/15/19 1500  cefTAZidime (FORTAZ) 2 g in sodium chloride 0.9 % 100 mL IVPB     2 g 200 mL/hr over 30 Minutes Intravenous Every 8 hours 01/15/19 1448 01/19/19 0542   01/14/19 1400  colistimethate ((COLYMYCIN-M)) nebulized solution 150 mg  Status:  Discontinued     150 mg Inhalation 3 times daily 01/14/19 1142 01/19/19 1119   01/14/19 1300  meropenem (MERREM) 1 g in sodium chloride 0.9 % 100 mL IVPB  Status:  Discontinued     1 g 200 mL/hr over 30 Minutes Intravenous Every 8 hours 01/14/19 1235 01/15/19 1448   12/23/18 1506  vancomycin (VANCOCIN) 1-5 GM/200ML-% IVPB    Note to Pharmacy:  [facility]   : cabinet  override      12/23/18 1506 12/23/18 1515   12/20/18 2200  vancomycin (VANCOCIN) IVPB 1000 mg/200 mL premix  Status:  Discontinued     1,000 mg 200 mL/hr over 60 Minutes Intravenous Every 12 hours 12/20/18 1804 12/22/18 1413   12/18/18 0800  ceFEPIme (MAXIPIME) 1 g in sodium chloride 0.9 % 100 mL IVPB  Status:  Discontinued     1 g 200 mL/hr over 30 Minutes Intravenous Every 8 hours 12/18/18 0713 12/19/18 1201   12/17/18 0530  vancomycin (VANCOCIN) 1,500 mg in sodium chloride 0.9 % 500 mL IVPB  Status:  Discontinued     1,500 mg 250 mL/hr over 120 Minutes Intravenous Every 12 hours 12/16/18 1704 12/20/18 1804   12/16/18 1730  cefTAZidime (FORTAZ) 2 g in sodium chloride 0.9 % 100 mL IVPB  Status:  Discontinued     2 g 200 mL/hr over 30 Minutes Intravenous Every 8 hours 12/16/18 1704 12/18/18 0702   12/16/18 1730  vancomycin (VANCOCIN) 2,000 mg in sodium chloride 0.9 % 500 mL IVPB     2,000 mg 250 mL/hr over 120 Minutes Intravenous  Once 12/16/18 1704 12/16/18 2030   12/07/18 1300  erythromycin 500 mg in sodium chloride 0.9 % 100 mL IVPB  Status:  Discontinued     500 mg 100 mL/hr over 60 Minutes Intravenous Every 6 hours 12/07/18 1118 12/16/18 1627   12/06/18 1500  vancomycin (VANCOCIN) 1,250 mg in sodium chloride 0.9 % 250 mL IVPB  Status:  Discontinued     1,250 mg 166.7 mL/hr over 90 Minutes Intravenous Every 12 hours 12/06/18 1436 12/07/18 1118   12/06/18 0100  vancomycin (VANCOCIN) 1,250 mg in sodium chloride 0.9 % 250 mL IVPB     1,250 mg 166.7 mL/hr over 90 Minutes Intravenous  Once 12/05/18 1054 12/06/18 0146   12/05/18 1200  vancomycin (VANCOCIN) 2,000 mg in sodium chloride 0.9 % 500 mL IVPB     2,000 mg 250 mL/hr over 120 Minutes Intravenous  Once 12/05/18 1054 12/05/18 1350   12/05/18 1200  cefTAZidime (FORTAZ) 1 g in sodium chloride 0.9 % 100 mL IVPB  Status:  Discontinued     1 g 200 mL/hr over 30 Minutes Intravenous Every 8 hours 12/05/18 1106 12/07/18 1118   11/30/18  2200  oseltamivir (TAMIFLU) 6 MG/ML suspension 30 mg     30 mg Per Tube 2 times daily 11/30/18 1348 12/03/18 2159   11/30/18 2200  ceFEPIme (MAXIPIME) 1 g in sodium chloride 0.9 % 100 mL IVPB  Status:  Discontinued     1 g 200 mL/hr over 30 Minutes Intravenous Every 12 hours 11/30/18 1350 12/01/18 1039   11/29/18 2200  ceFEPIme (MAXIPIME) 2 g in sodium chloride 0.9 % 100 mL IVPB  Status:  Discontinued     2 g 200 mL/hr over 30 Minutes Intravenous Every 12 hours 11/29/18 1645 11/30/18 1350   11/29/18 1100  oseltamivir (TAMIFLU) 6 MG/ML suspension 75 mg  Status:  Discontinued     75 mg Per Tube 2 times daily 11/29/18 1000 11/30/18 1348   11/28/18 2300  oseltamivir (TAMIFLU) capsule 75 mg  Status:  Discontinued     75 mg Oral 2 times daily 11/28/18 2238 11/29/18 1000   11/28/18 2030  oseltamivir (TAMIFLU) capsule 75 mg     75 mg Oral  Once 11/28/18 2029 11/28/18 2207   11/28/18 1745  vancomycin (VANCOCIN) 2,000 mg in sodium chloride 0.9 % 500 mL IVPB     2,000 mg 250 mL/hr over 120 Minutes Intravenous  Once 11/28/18 1741 11/28/18 2203   11/28/18 1745  piperacillin-tazobactam (ZOSYN) IVPB 3.375 g     3.375 g 100 mL/hr over 30 Minutes Intravenous  Once 11/28/18 1741 11/28/18 1834       Assessment/Plan HTN CHF DM S/p tx for Influenza A S/p tracheostomy  S/p tx for HCAP Fever  Sacral Wound - No indication for surgical debridement at this time - MRI negative for osteomyelitis of areas evaluated - Increase hydrotherapy to daily. Continue santyl and WTD dressing changes BID - Turn patient q2hrs  FEN - TF's VTE - Lovenox ID - Tmax 100.7 overnight. WBC 9.0, currently on Cefepime  Plan: Increase hydrotherapy from 3x/weekly to 6x/weekly. Turn patient q 2  hrs. Continue santyl. We will reassess on Monday. Please call over the weekend with any concerns.    LOS: 70 days    Jacinto HalimMichael M Kynlee Koenigsberg , [facility] 02/06/2019, 10:28 AM Pager: [phone]

## 2019-02-06 NOTE — Progress Notes (Signed)
Physical Therapy Treatment Patient Details Name: Antonio [name]. MRN: [id number] DOB: 03/31/1956 Today's Date: 02/06/2019    History of Present Illness Pt is a 63 y.o. male admitted 11/28/18 with flu A and PNA; acute respiratory failure intubated 1/4. Trach placed 1/16. PEG placed 1/28. Hospital course complicared by acute MI, CHF, cardiogenic shock, and mucous plugging. Also with sacral wound. PMH includes DM, HTN, HLD, CAD, L BKA, R transmet amputation.    PT Comments    Pt was willing to participate today.  Asked for assist and refused assist at odd times.  Pt generally able to donn his brace at EOB without assist, can not donn the shoe on his left foot.  Emphasis on sit to stand, balance and progressing gait.    Follow Up Recommendations  SNF     Equipment Recommendations  Rolling walker with 5" wheels;3in1 (PT)    Recommendations for Other Services       Precautions / Restrictions Precautions Precautions: Fall Other Brace: L LE prosthesis Restrictions LLE Weight Bearing: Weight bearing as tolerated    Mobility  Bed Mobility Overal bed mobility: Needs Assistance Bed Mobility: Supine to Sit   Sidelying to sit: HOB elevated Supine to sit: Mod assist     General bed mobility comments: pt has difficulty coming up using R UE.  assisted trunk to upright sitting  Transfers Overall transfer level: Needs assistance Equipment used: Rolling walker (2 wheeled) Transfers: Sit to/from Stand Sit to Stand: Min assist         General transfer comment: assist both forward and up.  Ambulation/Gait Ambulation/Gait assistance: Min guard Gait Distance (Feet): 180 Feet Assistive device: Rolling walker (2 wheeled) Gait Pattern/deviations: Step-through pattern Gait velocity: decreased Gait velocity interpretation: <1.8 ft/sec, indicate of risk for recurrent falls General Gait Details: mildly unsteady overall, but pt did not want appropriate assist.   Stairs             Wheelchair Mobility    Modified Rankin (Stroke Patients Only)       Balance     Sitting balance-Leahy Scale: Good     Standing balance support: No upper extremity supported;Bilateral upper extremity supported Standing balance-Leahy Scale: Poor Standing balance comment: reliant on the RW                            Cognition Arousal/Alertness: Awake/alert Behavior During Therapy: WFL for tasks assessed/performed Overall Cognitive Status: Within Functional Limits for tasks assessed                                        Exercises      General Comments General comments (skin integrity, edema, etc.): pt ambulated without O2.  pt became fatigued and out of breath, but did not get readings due to no available dynamap.      Pertinent Vitals/Pain Pain Assessment: Faces Faces Pain Scale: No hurt Pain Intervention(s): Monitored during session    Home Living                      Prior Function            PT Goals (current goals can now be found in the care plan section) Acute Rehab PT Goals Patient Stated Goal: to be home by his birthday PT Goal Formulation: With patient Time For Goal Achievement:  02/11/19 Potential to Achieve Goals: Good Progress towards PT goals: Progressing toward goals    Frequency    Min 3X/week      PT Plan Current plan remains appropriate    Co-evaluation              AM-PAC PT "6 Clicks" Mobility   Outcome Measure  Help needed turning from your back to your side while in a flat bed without using bedrails?: None Help needed moving from lying on your back to sitting on the side of a flat bed without using bedrails?: A Little Help needed moving to and from a bed to a chair (including a wheelchair)?: A Little Help needed standing up from a chair using your arms (e.g., wheelchair or bedside chair)?: A Little Help needed to walk in hospital room?: A Little Help needed climbing 3-5 steps with  a railing? : Total 6 Click Score: 17    End of Session   Activity Tolerance: Patient tolerated treatment well Patient left: in bed;with call bell/phone within reach Nurse Communication: Mobility status PT Visit Diagnosis: Muscle weakness (generalized) (M62.81);Unsteadiness on feet (R26.81)     Time: 4825-0037 PT Time Calculation (min) (ACUTE ONLY): 21 min  Charges:  $Gait Training: 8-22 mins                     02/06/2019  Ulen Bing, PT Acute Rehabilitation Services [phone]  (pager) [phone]  (office)   Eliseo Gum Willena Jeancharles 02/06/2019, 7:08 PM

## 2019-02-06 NOTE — Progress Notes (Signed)
Per [facility], they are now unable to accept patient do to COVID-19 and patient being "high risk". CSW attempting to locate another SNF bed.   Osborne Casco Blakelynn Scheeler LCSW [phone]

## 2019-02-07 LAB — BASIC METABOLIC PANEL
Anion gap: 10 (ref 5–15)
BUN: 13 mg/dL (ref 8–23)
CO2: 26 mmol/L (ref 22–32)
Calcium: 8.6 mg/dL — ABNORMAL LOW (ref 8.9–10.3)
Chloride: 98 mmol/L (ref 98–111)
Creatinine, Ser: 0.42 mg/dL — ABNORMAL LOW (ref 0.61–1.24)
GFR calc non Af Amer: >60 mL/min (ref >60)
Glucose, Bld: 166 mg/dL — ABNORMAL HIGH (ref 70–99)
Potassium: 3.9 mmol/L (ref 3.5–5.1)
SODIUM: 134 mmol/L — AB (ref 135–145)

## 2019-02-07 LAB — CBC WITH DIFFERENTIAL/PLATELET
ABS IMMATURE GRANULOCYTES: 0.02 10*3/uL (ref 0.00–0.07)
BASOS PCT: 0 %
Basophils Absolute: 0 10*3/uL (ref 0.0–0.1)
Eosinophils Absolute: 0.2 10*3/uL (ref 0.0–0.5)
Eosinophils Relative: 3 %
HCT: 31.2 % — ABNORMAL LOW (ref 39.0–52.0)
Hemoglobin: 9.4 g/dL — ABNORMAL LOW (ref 13.0–17.0)
Immature Granulocytes: 0 %
Lymphocytes Relative: 18 %
Lymphs Abs: 1.3 10*3/uL (ref 0.7–4.0)
MCH: 26.1 pg (ref 26.0–34.0)
MCHC: 30.1 g/dL (ref 30.0–36.0)
MCV: 86.7 fL (ref 80.0–100.0)
Monocytes Absolute: 0.7 10*3/uL (ref 0.1–1.0)
Monocytes Relative: 9 %
NEUTROS ABS: 5.2 10*3/uL (ref 1.7–7.7)
Neutrophils Relative %: 70 %
Platelets: 176 10*3/uL (ref 150–400)
RBC: 3.6 MIL/uL — ABNORMAL LOW (ref 4.22–5.81)
RDW: 15.6 % — ABNORMAL HIGH (ref 11.5–15.5)
WBC: 7.4 10*3/uL (ref 4.0–10.5)
nRBC: 0 % (ref 0.0–0.2)

## 2019-02-07 LAB — GLUCOSE, CAPILLARY
GLUCOSE-CAPILLARY: 149 mg/dL — AB (ref 70–99)
Glucose-Capillary: 154 mg/dL — ABNORMAL HIGH (ref 70–99)
Glucose-Capillary: 127 mg/dL — ABNORMAL HIGH (ref 70–99)
Glucose-Capillary: 127 mg/dL — ABNORMAL HIGH (ref 70–99)

## 2019-02-07 LAB — CULTURE, RESPIRATORY W GRAM STAIN

## 2019-02-07 LAB — MAGNESIUM: Magnesium: 1.8 mg/dL (ref 1.7–2.4)

## 2019-02-07 LAB — CULTURE, RESPIRATORY

## 2019-02-07 NOTE — Progress Notes (Signed)
Patient ID: Antonio Richardsaniel Watson Dillie Jr., male   DOB: 1956/06/17, 63 y.o.   MRN: [id number]  PROGRESS NOTE    Antonio Richardsaniel Watson Dufford Jr.  WUJ:[id number] DOB: 1956/06/17 DOA: 11/28/2018 PCP: [name], [name], NP   Brief Narrative:  63 year old male was admitted on 11/28/2018 with influenza A causing respiratory failure and cardiogenic shock.  Patient has had prolonged hospitalization with mechanical ventilation and subsequent tracheostomy placed on December 11, 2018.  He was treated with broad-spectrum antibiotics for his Pseudomonas from his sputum culture.  He was switched to cuffless trach on 01/27/2019.  He also had PEG tube placement.  Significant events: 1/6 self extubated  1/7 re-intubated  1/8 self extubated 1/9 acute respiratory failure, mucus plugging right lower lobe, re-intubated, bronchoscopy 1/10 New fever T Max 103.1, antibiotics resumed with ceftaz and vancomycin 1/11 Hemodynamically seems to be improving. Very hypernatremic. X-ray improving. Adding free water, one-time Lasix, follow-up chemistry a.m. Hoping to initiate weaning efforts on 1/12 1/16 Continuing diuresis and ongoing ventilatory support. Working on weaning but still has significant work of breathing. Plan has been to proceed with tracheostomy. Hopefully we can continue ongoing diuresis and continue to decrease sedation 1/20 still struggling to wean. Secretions and anxiety are barriers.  1/21 trach revision to 6 distal XLT, fever, increased secretions, abx broadened / bronch 1/22 ongoing thick secretions 1/24 Failed SBT, low grade fever, Per CXR persistent pleural effusions / basilar consolidation atelectasis vs pneumonia per bases 1/25 Mucus plugged with slow recovery after chest PT and Wound Care 2/3  first time weaned for greater than 15 minutes (previously failed due to agitation, secretions, tachypnea) 2/10 TRH assumed care 2/11 trach changed to a #5 cuffless per PCCM  2/13 trach changed to #4 shiley cuffless  2/13  afternoon - pulled out new trach and "disoldged" PEG - both reinserted/repositioned  2/13 PM > 2/14 AM acute hypoxic failure - emergent bedside bronch > trach obstructed - #6 cuffed trach placed > back to ICU on vent  2/14 trach changed to #5 cuffed 2/15 TRH resumed care - resp distress > back on vent  2/15 Failed ATC with increased WOB 2/16 Failed ATC with increased WOB  2/21 back on pressure support 2/24 back on TC  01/27/19 - 5  XLT cuffless trach   Assessment & Plan:   Active Problems:   Chest pain   SIRS (systemic inflammatory response syndrome) (HCC)   Acute respiratory failure with hypoxemia (HCC)   Pressure injury of skin   Influenza A virus present   Pneumonia   Tracheostomy status (HCC)   Acute pulmonary edema (HCC)   Hypoxia   Fever, unspecified   Tachycardia   Acute on chronic respiratory failure (HCC)   Hypoxemia   Phlegm in throat   Dysphagia   Status post tracheostomy (HCC)   HCAP (healthcare-associated pneumonia)   Diabetes mellitus type 2 in nonobese (HCC)   Essential hypertension   S/P BKA (below knee amputation) unilateral, left (HCC)   Tachypnea   Pain   Agitation   Acute blood loss anemia   Acute respiratory failure (HCC)   Airway trauma  Fever -No temperature spike over the last 24 hours.  Cultures negative so far.  Chest x-ray was negative for infiltrates.  -Currently on cefepime.  Today is day #4 of cefepime.  Discontinue cefepime after tomorrow's doses.  Vancomycin and Flagyl have been discontinued. -If patient continues to spike temperatures, might have to get CT of the chest abdomen and pelvis.    Leukocytosis -  Resolved.  Acute hypoxic respiratory failure secondary to influenza A -Patient was under the care of ICU, required multiple intubations and subsequently tracheostomy -Sputum culture from trach had grown Corynebacterium stratum and Pseudomonas recently.  He was treated with ceftazidime for the same. -He has been switched to cuffless  trach on 01/27/2019 by pulmonary.  Pulmonary following intermittently.  Sacral/coccygeal stage IV pressure injury -Wound care evaluation appreciated.   -General surgery evaluation has been appreciated: No need for any surgical debridement at this time.  Continue wound care and hydrotherapy.  MRI of the spine was negative for any evidence of osteomyelitis in the lumbosacral spine.    Acute systolic heart failure -EF was 20 to 25% on echo on 11/29/2018 but improved to 60 to 65% on 12/24/2018. -Most likely secondary to sepsis -No plans for cardiac cath per cardiology -Continue beta-blocker.  Continue strict input and output.  Dysphagia -Status post PEG tube placement.  Continue PEG tube feeding. -SLP following the patient and is recommending dysphagia 2 diet.  Patient will need repeat MBS as an outpatient not before 6 to 8 weeks.  Acute metabolic encephalopathy -Resolved  Deconditioning - will need SNF placement.  Social worker following  Diabetes mellitus type 2 -Monitor CBGs.  Continue Levemir with sliding scale insulin  DVT prophylaxis: Lovenox Code Status: Full code Family Communication: None Disposition Plan: SNF in the next few days if remains afebrile.  Consultants: PCCM/cardiology/general surgery  Procedures:  As described above  Antimicrobials:  Anti-infectives (From admission, onward)   Start     Dose/Rate Route Frequency Ordered Stop   02/04/19 1300  vancomycin (VANCOCIN) 1,250 mg in sodium chloride 0.9 % 250 mL IVPB  Status:  Discontinued     1,250 mg 166.7 mL/hr over 90 Minutes Intravenous Every 12 hours 02/04/19 1200 02/06/19 1002   02/04/19 1200  metroNIDAZOLE (FLAGYL) IVPB 500 mg  Status:  Discontinued     500 mg 100 mL/hr over 60 Minutes Intravenous Every 8 hours 02/04/19 1146 02/06/19 1002   02/04/19 0930  ceFEPIme (MAXIPIME) 2 g in sodium chloride 0.9 % 100 mL IVPB     2 g 200 mL/hr over 30 Minutes Intravenous Every 8 hours 02/04/19 0926     02/03/19 1200   meropenem (MERREM) 1 g in sodium chloride 0.9 % 100 mL IVPB  Status:  Discontinued     1 g 200 mL/hr over 30 Minutes Intravenous Every 8 hours 02/03/19 1054 02/04/19 0924   01/19/19 1400  cefTAZidime (FORTAZ) 2 g in sodium chloride 0.9 % 100 mL IVPB     2 g 200 mL/hr over 30 Minutes Intravenous Every 8 hours 01/19/19 1120 01/24/19 2359   01/15/19 1500  cefTAZidime (FORTAZ) 2 g in sodium chloride 0.9 % 100 mL IVPB     2 g 200 mL/hr over 30 Minutes Intravenous Every 8 hours 01/15/19 1448 01/19/19 0542   01/14/19 1400  colistimethate ((COLYMYCIN-M)) nebulized solution 150 mg  Status:  Discontinued     150 mg Inhalation 3 times daily 01/14/19 1142 01/19/19 1119   01/14/19 1300  meropenem (MERREM) 1 g in sodium chloride 0.9 % 100 mL IVPB  Status:  Discontinued     1 g 200 mL/hr over 30 Minutes Intravenous Every 8 hours 01/14/19 1235 01/15/19 1448   12/23/18 1506  vancomycin (VANCOCIN) 1-5 GM/200ML-% IVPB    Note to Pharmacy:  [facility]   : cabinet override      12/23/18 1506 12/23/18 1515   12/20/18 2200  vancomycin (  VANCOCIN) IVPB 1000 mg/200 mL premix  Status:  Discontinued     1,000 mg 200 mL/hr over 60 Minutes Intravenous Every 12 hours 12/20/18 1804 12/22/18 1413   12/18/18 0800  ceFEPIme (MAXIPIME) 1 g in sodium chloride 0.9 % 100 mL IVPB  Status:  Discontinued     1 g 200 mL/hr over 30 Minutes Intravenous Every 8 hours 12/18/18 0713 12/19/18 1201   12/17/18 0530  vancomycin (VANCOCIN) 1,500 mg in sodium chloride 0.9 % 500 mL IVPB  Status:  Discontinued     1,500 mg 250 mL/hr over 120 Minutes Intravenous Every 12 hours 12/16/18 1704 12/20/18 1804   12/16/18 1730  cefTAZidime (FORTAZ) 2 g in sodium chloride 0.9 % 100 mL IVPB  Status:  Discontinued     2 g 200 mL/hr over 30 Minutes Intravenous Every 8 hours 12/16/18 1704 12/18/18 0702   12/16/18 1730  vancomycin (VANCOCIN) 2,000 mg in sodium chloride 0.9 % 500 mL IVPB     2,000 mg 250 mL/hr over 120 Minutes Intravenous  Once  12/16/18 1704 12/16/18 2030   12/07/18 1300  erythromycin 500 mg in sodium chloride 0.9 % 100 mL IVPB  Status:  Discontinued     500 mg 100 mL/hr over 60 Minutes Intravenous Every 6 hours 12/07/18 1118 12/16/18 1627   12/06/18 1500  vancomycin (VANCOCIN) 1,250 mg in sodium chloride 0.9 % 250 mL IVPB  Status:  Discontinued     1,250 mg 166.7 mL/hr over 90 Minutes Intravenous Every 12 hours 12/06/18 1436 12/07/18 1118   12/06/18 0100  vancomycin (VANCOCIN) 1,250 mg in sodium chloride 0.9 % 250 mL IVPB     1,250 mg 166.7 mL/hr over 90 Minutes Intravenous  Once 12/05/18 1054 12/06/18 0146   12/05/18 1200  vancomycin (VANCOCIN) 2,000 mg in sodium chloride 0.9 % 500 mL IVPB     2,000 mg 250 mL/hr over 120 Minutes Intravenous  Once 12/05/18 1054 12/05/18 1350   12/05/18 1200  cefTAZidime (FORTAZ) 1 g in sodium chloride 0.9 % 100 mL IVPB  Status:  Discontinued     1 g 200 mL/hr over 30 Minutes Intravenous Every 8 hours 12/05/18 1106 12/07/18 1118   11/30/18 2200  oseltamivir (TAMIFLU) 6 MG/ML suspension 30 mg     30 mg Per Tube 2 times daily 11/30/18 1348 12/03/18 2159   11/30/18 2200  ceFEPIme (MAXIPIME) 1 g in sodium chloride 0.9 % 100 mL IVPB  Status:  Discontinued     1 g 200 mL/hr over 30 Minutes Intravenous Every 12 hours 11/30/18 1350 12/01/18 1039   11/29/18 2200  ceFEPIme (MAXIPIME) 2 g in sodium chloride 0.9 % 100 mL IVPB  Status:  Discontinued     2 g 200 mL/hr over 30 Minutes Intravenous Every 12 hours 11/29/18 1645 11/30/18 1350   11/29/18 1100  oseltamivir (TAMIFLU) 6 MG/ML suspension 75 mg  Status:  Discontinued     75 mg Per Tube 2 times daily 11/29/18 1000 11/30/18 1348   11/28/18 2300  oseltamivir (TAMIFLU) capsule 75 mg  Status:  Discontinued     75 mg Oral 2 times daily 11/28/18 2238 11/29/18 1000   11/28/18 2030  oseltamivir (TAMIFLU) capsule 75 mg     75 mg Oral  Once 11/28/18 2029 11/28/18 2207   11/28/18 1745  vancomycin (VANCOCIN) 2,000 mg in sodium chloride 0.9 % 500  mL IVPB     2,000 mg 250 mL/hr over 120 Minutes Intravenous  Once 11/28/18 1741 11/28/18 2203  11/28/18 1745  piperacillin-tazobactam (ZOSYN) IVPB 3.375 g     3.375 g 100 mL/hr over 30 Minutes Intravenous  Once 11/28/18 1741 11/28/18 1834       Subjective: Patient seen and examined at bedside.  He denies any overnight fever, nausea, vomiting, worsening abdominal pain or diarrhea. Objective: Vitals:   02/06/19 2153 02/07/19 0525 02/07/19 0744 02/07/19 0748  BP: (!) 149/73 (!) 150/73    Pulse: 81 77    Resp: 19     Temp: 98.7 F (37.1 C) 98.2 F (36.8 C)    TempSrc: Oral Oral    SpO2: 95% 96% 98% 99%  Weight:      Height:        Intake/Output Summary (Last 24 hours) at 02/07/2019 1043 Last data filed at 02/07/2019 1004 Gross per 24 hour  Intake 1664.14 ml  Output 425 ml  Net 1239.14 ml   Filed Weights   02/02/19 0257 02/03/19 0554 02/04/19 0547  Weight: 89.6 kg 120.2 kg 81.2 kg    Examination:  General exam: Appears calm and comfortable.  No acute distress. Respiratory system: Bilateral decreased breath sounds at bases with scattered crackles.  No wheezing Cardiovascular system: S1-S2 heard, rate controlled. Gastrointestinal system: Abdomen is nondistended, soft and nontender. Normal bowel sounds heard. Extremities: No cyanosis, edema.  Left BKA; right first toe ray amputation.     Data Reviewed: I have personally reviewed following labs and imaging studies  CBC: Recent Labs  Lab 02/03/19 0732 02/04/19 1201 02/05/19 0323 02/06/19 0413 02/07/19 0549  WBC 12.8* 12.3* 9.9 9.0 7.4  NEUTROABS  --  9.7* 7.8* 6.6 5.2  HGB 12.0* 11.8* 10.7* 9.2* 9.4*  HCT 37.7* 37.2* 33.9* 29.8* 31.2*  MCV 85.7 86.5 85.8 85.4 86.7  PLT 160 171 167 170 176   Basic Metabolic Panel: Recent Labs  Lab 02/03/19 0732 02/04/19 1201 02/05/19 0323 02/06/19 0413 02/07/19 0549  NA 133* 132* 133* 133* 134*  K 4.0 3.8 4.0 3.9 3.9  CL 96* 95* 96* 98 98  CO2 GLUCOSE  136* 189* 191* 175* 166*  BUN 21 31* CREATININE 0.71 0.67 0.58* 0.47* 0.42*  CALCIUM 9.1 8.6* 8.4* 8.1* 8.6*  MG  --   --  1.8 1.8 1.8   GFR: Estimated Creatinine Clearance: 106.4 mL/min (A) (by C-G formula based on SCr of 0.42 mg/dL (L)). Liver Function Tests: Recent Labs  Lab 02/04/19 1201  AST 11*  ALT 14  ALKPHOS 77  BILITOT 0.6  PROT 6.6  ALBUMIN 2.5*   No results for input(s): LIPASE, AMYLASE in the last 168 hours. No results for input(s): AMMONIA in the last 168 hours. Coagulation Profile: No results for input(s): INR, PROTIME in the last 168 hours. Cardiac Enzymes: No results for input(s): CKTOTAL, CKMB, CKMBINDEX, TROPONINI in the last 168 hours. BNP (last 3 results) No results for input(s): PROBNP in the last 8760 hours. HbA1C: No results for input(s): HGBA1C in the last 72 hours. CBG: Recent Labs  Lab 02/06/19 0855 02/06/19 1143 02/06/19 1713 02/06/19 2155 02/07/19 0803  GLUCAP 188* 169* 136* 152* 149*   Lipid Profile: No results for input(s): CHOL, HDL, LDLCALC, TRIG, CHOLHDL, LDLDIRECT in the last 72 hours. Thyroid Function Tests: No results for input(s): TSH, T4TOTAL, FREET4, T3FREE, THYROIDAB in the last 72 hours. Anemia Panel: No results for input(s): VITAMINB12, FOLATE, FERRITIN, TIBC, IRON, RETICCTPCT in the last 72 hours. Sepsis Labs: Recent Labs  Lab 02/04/19 1201  PROCALCITON 0.26    Recent Results (from the past 240 hour(s))  Expectorated sputum assessment w rflx to resp cult     Status: None   Collection Time: 02/04/19 10:44 AM  Result Value Ref Range Status   Specimen Description SPUTUM  Final   Special Requests NONE  Final   Sputum evaluation   Final    THIS SPECIMEN IS ACCEPTABLE FOR SPUTUM CULTURE Performed at [facility], [address] [place], [place] [address]    Report Status 02/04/2019 FINAL  Final  Culture, respiratory     Status: None (Preliminary result)   Collection Time: 02/04/19 10:44 AM  Result  Value Ref Range Status   Specimen Description SPUTUM  Final   Special Requests NONE Reflexed from [id number]  Final   Gram Stain   Final    ABUNDANT WBC PRESENT, PREDOMINANTLY PMN RARE SQUAMOUS EPITHELIAL CELLS PRESENT RARE GRAM POSITIVE RODS Performed at [facility], [address] [place], [place] [address]    Culture FEW PSEUDOMONAS AERUGINOSA  Final   Report Status PENDING  Incomplete  Culture, blood (routine x 2)     Status: None (Preliminary result)   Collection Time: 02/04/19 11:52 AM  Result Value Ref Range Status   Specimen Description BLOOD LEFT ARM  Final   Special Requests   Final    BOTTLES DRAWN AEROBIC ONLY Blood Culture adequate volume   Culture   Final    NO GROWTH 2 DAYS Performed at [facility], [address] [place], [place] [address]    Report Status PENDING  Incomplete  Culture, blood (routine x 2)     Status: None (Preliminary result)   Collection Time: 02/04/19 12:00 PM  Result Value Ref Range Status   Specimen Description BLOOD LEFT ARM  Final   Special Requests   Final    BOTTLES DRAWN AEROBIC ONLY Blood Culture adequate volume   Culture   Final    NO GROWTH 2 DAYS Performed at [facility], [address], [place], [place] [address]    Report Status PENDING  Incomplete  Culture, Urine     Status: None   Collection Time: 02/04/19  1:54 PM  Result Value Ref Range Status   Specimen Description URINE, CLEAN CATCH  Final   Special Requests NONE  Final   Culture   Final    NO GROWTH Performed at [facility], [address] [place], [place] [address]    Report Status 02/05/2019 FINAL  Final         Radiology Studies: No results found.      Scheduled Meds: . arformoterol  15 mcg Nebulization BID  . aspirin  81 mg Per Tube Daily  . atorvastatin  40 mg Per Tube q1800  . budesonide (PULMICORT) nebulizer solution  0.5 mg Nebulization BID  . carvedilol  3.125 mg Per Tube BID WC  . collagenase   Topical Daily  .  enoxaparin (LOVENOX) injection  40 mg Subcutaneous Q24H  . famotidine  20 mg Per Tube BID  . folic acid  1 mg Per Tube Daily  . guaiFENesin  5 mL Per Tube Q12H  . insulin aspart  0-20 Units Subcutaneous TID WC  . insulin detemir  25 Units Subcutaneous BID  . multivitamin  15 mL Per Tube Daily  . nutrition supplement (JUVEN)  1 packet Per Tube BID BM  . tapentadol  50 mg Oral QID  . thiamine  100 mg Per Tube Daily   Continuous Infusions: . sodium chloride 10 mL/hr at 01/24/19 2000  . ceFEPime (MAXIPIME) IV 2 g (02/07/19 1004)  . feeding supplement (OSMOLITE 1.2 CAL) 1,000 mL (02/06/19 2207)     LOS: 71 days        Glade Lloyd, MD [facility] 02/07/2019, 10:43 AM

## 2019-02-07 NOTE — Progress Notes (Signed)
Physical Therapy Wound Treatment Patient Details  Name: Antonio [name]. MRN: [id number] Date of Birth: Feb 05, 1956  Today's Date: 02/07/2019 Time: 9024-0973 Time Calculation (min): 30 min  Subjective  Subjective: Pt agreeable Patient and Family Stated Goals: "go home." Date of Onset: (unknown) Prior Treatments: dressing changes  Pain Score: Pain Score: 8   Wound Assessment  Pressure Injury 12/01/18 Unstageable - Full thickness tissue loss in which the base of the ulcer is covered by slough (yellow, tan, gray, green or brown) and/or eschar (tan, brown or black) in the wound bed. sacrum (Active)  Wound Image   02/04/2019 12:12 PM  Dressing Type ABD;Moist to dry;Tape dressing;Barrier Film (skin prep);Gauze (Comment) 02/07/2019  5:26 PM  Dressing Clean;Intact;Dry 02/07/2019  5:26 PM  Dressing Change Frequency Twice a day 02/07/2019  5:26 PM  State of Healing Early/partial granulation 02/07/2019  5:26 PM  Site / Wound Assessment Red;Yellow;Pink 02/07/2019  5:26 PM  % Wound base Red or Granulating 55% 02/07/2019  5:26 PM  % Wound base Yellow/Fibrinous Exudate 40% 02/07/2019  5:26 PM  % Wound base Black/Eschar 5% 02/07/2019  5:26 PM  % Wound base Other/Granulation Tissue (Comment) 0% 02/06/2019  4:51 PM  Peri-wound Assessment Erythema (blanchable);Pink 02/07/2019  5:26 PM  Wound Length (cm) 9 cm 02/04/2019 12:12 PM  Wound Width (cm) 5 cm 02/04/2019 12:12 PM  Wound Depth (cm) 2 cm 02/04/2019 12:12 PM  Wound Surface Area (cm^2) 45 cm^2 02/04/2019 12:12 PM  Wound Volume (cm^3) 90 cm^3 02/04/2019 12:12 PM  Tunneling (cm) new tunneling noted at 7 oclock did not formally measure.  purulent drainage coming from tract with odor 02/07/2019  5:26 PM  Margins Unattached edges (unapproximated) 02/07/2019  5:26 PM  Drainage Amount Minimal 02/07/2019  5:26 PM  Drainage Description Serosanguineous;Purulent 02/07/2019  5:26 PM  Treatment Cleansed;Packing (Saline gauze);Debridement (Selective);Hydrotherapy (Pulse  lavage) 02/07/2019  5:26 PM     Santyl applied to wound bed prior to applying dressing.  Hydrotherapy Pulsed lavage therapy - wound location: sacrum Pulsed Lavage with Suction (psi): 8 psi Pulsed Lavage with Suction - Normal Saline Used: 1000 mL Pulsed Lavage Tip: Tip with splash shield Selective Debridement Selective Debridement - Location: sacrum Selective Debridement - Tools Used: Forceps;Scissors;Scalpel Selective Debridement - Tissue Removed: yellow sloughthy necrotic tissue   Wound Assessment and Plan  Wound Therapy - Assess/Plan/Recommendations Wound Therapy - Clinical Statement: Location of new tract/tunnel found at 7 oclock.  Able to debride increased amount of sloughthy necrotic tissue from area.  Odor noted with minimal purulent drainage.  Pt continues to benefit from hydrotherapy to decrease necrosis and reduce bioburden in wound bed.  Pt continues to benefit from 6x /wk to improve healing of sacral wound.   Wound Therapy - Functional Problem List: decr mobility Factors Delaying/Impairing Wound Healing: Diabetes Mellitus;Immobility;Multiple medical problems Hydrotherapy Plan: Debridement;Dressing change;Patient/family education;Pulsatile lavage with suction Wound Therapy - Frequency: 3X / week Wound Therapy - Follow Up Recommendations: Skilled nursing facility Wound Plan: see above  Wound Therapy Goals- Improve the function of patient's integumentary system by progressing the wound(s) through the phases of wound healing (inflammation - proliferation - remodeling) by: Decrease Necrotic Tissue to: 35 Decrease Necrotic Tissue - Progress: Progressing toward goal Increase Granulation Tissue to: 65 Increase Granulation Tissue - Progress: Progressing toward goal  Goals will be updated until maximal potential achieved or discharge criteria met.  Discharge criteria: when goals achieved, discharge from hospital, MD decision/surgical intervention, no progress towards goals,  refusal/missing three consecutive treatments without notification or  medical reason.  [name] 02/07/2019, 5:35 PM Governor Rooks, PTA Acute Rehabilitation Services Pager [phone] Office [phone]

## 2019-02-08 DIAGNOSIS — Z931 Gastrostomy status: Secondary | ICD-10-CM

## 2019-02-08 LAB — GLUCOSE, CAPILLARY
Glucose-Capillary: 100 mg/dL — ABNORMAL HIGH (ref 70–99)
Glucose-Capillary: 135 mg/dL — ABNORMAL HIGH (ref 70–99)
Glucose-Capillary: 128 mg/dL — ABNORMAL HIGH (ref 70–99)
Glucose-Capillary: 120 mg/dL — ABNORMAL HIGH (ref 70–99)

## 2019-02-08 MED ORDER — RESOURCE THICKENUP CLEAR PO POWD: ORAL | Status: DC | PRN

## 2019-02-08 NOTE — Progress Notes (Signed)
Patient ID: Antonio Richards., male   DOB: 01-Jul-1956, 63 y.o.   MRN: [id number]  PROGRESS NOTE    Antonio Richards.  WKG:[id number] DOB: 12-25-1955 DOA: 11/28/2018 PCP: Dema Severin, NP   Brief Narrative:  63 year old male was admitted on 11/28/2018 with influenza A causing respiratory failure and cardiogenic shock.  Patient has had prolonged hospitalization with mechanical ventilation and subsequent tracheostomy placed on December 11, 2018.  He was treated with broad-spectrum antibiotics for his Pseudomonas from his sputum culture.  He was switched to cuffless trach on 01/27/2019.  He also had PEG tube placement.  Significant events: 1/6 self extubated  1/7 re-intubated  1/8 self extubated 1/9 acute respiratory failure, mucus plugging right lower lobe, re-intubated, bronchoscopy 1/10 New fever T Max 103.1, antibiotics resumed with ceftaz and vancomycin 1/11 Hemodynamically seems to be improving. Very hypernatremic. X-ray improving. Adding free water, one-time Lasix, follow-up chemistry a.m. Hoping to initiate weaning efforts on 1/12 1/16 Continuing diuresis and ongoing ventilatory support. Working on weaning but still has significant work of breathing. Plan has been to proceed with tracheostomy. Hopefully we can continue ongoing diuresis and continue to decrease sedation 1/20 still struggling to wean. Secretions and anxiety are barriers.  1/21 trach revision to 6 distal XLT, fever, increased secretions, abx broadened / bronch 1/22 ongoing thick secretions 1/24 Failed SBT, low grade fever, Per CXR persistent pleural effusions / basilar consolidation atelectasis vs pneumonia per bases 1/25 Mucus plugged with slow recovery after chest PT and Wound Care 2/3  first time weaned for greater than 15 minutes (previously failed due to agitation, secretions, tachypnea) 2/10 TRH assumed care 2/11 trach changed to a #5 cuffless per PCCM  2/13 trach changed to #4 shiley cuffless  2/13  afternoon - pulled out new trach and "disoldged" PEG - both reinserted/repositioned  2/13 PM > 2/14 AM acute hypoxic failure - emergent bedside bronch > trach obstructed - #6 cuffed trach placed > back to ICU on vent  2/14 trach changed to #5 cuffed 2/15 TRH resumed care - resp distress > back on vent  2/15 Failed ATC with increased WOB 2/16 Failed ATC with increased WOB  2/21 back on pressure support 2/24 back on TC  01/27/19 - 5  XLT cuffless trach   Assessment & Plan:   Active Problems:   Chest pain   SIRS (systemic inflammatory response syndrome) (HCC)   Acute respiratory failure with hypoxemia (HCC)   Pressure injury of skin   Influenza A virus present   Pneumonia   Tracheostomy status (HCC)   Acute pulmonary edema (HCC)   Hypoxia   Fever, unspecified   Tachycardia   Acute on chronic respiratory failure (HCC)   Hypoxemia   Phlegm in throat   Dysphagia   Status post tracheostomy (HCC)   HCAP (healthcare-associated pneumonia)   Diabetes mellitus type 2 in nonobese (HCC)   Essential hypertension   S/P BKA (below knee amputation) unilateral, left (HCC)   Tachypnea   Pain   Agitation   Acute blood loss anemia   Acute respiratory failure (HCC)   Airway trauma  Fever -No temperature spike over the last 48 hours.  Cultures negative so far.  Chest x-ray was negative for infiltrates.  -Currently on cefepime.  Today is day #5 of cefepime.  Discontinue cefepime after today's doses.  Vancomycin and Flagyl have been discontinued.  Leukocytosis -Resolved.  Acute hypoxic respiratory failure secondary to influenza A -Patient was under the care of ICU, required multiple  intubations and subsequently tracheostomy -Sputum culture from trach had grown Corynebacterium stratum and Pseudomonas recently.  He was treated with ceftazidime for the same. -He has been switched to cuffless trach on 01/27/2019 by pulmonary.  Pulmonary following intermittently.  Sacral/coccygeal stage IV  pressure injury -Wound care evaluation appreciated.   -General surgery evaluation has been appreciated: No need for any surgical debridement at this time.  Continue wound care and hydrotherapy.  MRI of the spine was negative for any evidence of osteomyelitis in the lumbosacral spine.    Acute systolic heart failure -EF was 20 to 25% on echo on 11/29/2018 but improved to 60 to 65% on 12/24/2018. -Most likely secondary to sepsis -No plans for cardiac cath per cardiology -Continue beta-blocker.  Continue strict input and output.  Dysphagia -Status post PEG tube placement.  Continue PEG tube feeding. -SLP following the patient and is recommending dysphagia 2 diet.  Patient will need repeat MBS as an outpatient not before 6 to 8 weeks.  Acute metabolic encephalopathy -Resolved  Deconditioning - will need SNF placement.  Social worker following  Diabetes mellitus type 2 -Monitor CBGs.  Continue Levemir with sliding scale insulin  DVT prophylaxis: Lovenox Code Status: Full code Family Communication: None Disposition Plan: SNF once bed is available.  Consultants: PCCM/cardiology/general surgery  Procedures:  As described above  Antimicrobials:  Anti-infectives (From admission, onward)   Start     Dose/Rate Route Frequency Ordered Stop   02/04/19 1300  vancomycin (VANCOCIN) 1,250 mg in sodium chloride 0.9 % 250 mL IVPB  Status:  Discontinued     1,250 mg 166.7 mL/hr over 90 Minutes Intravenous Every 12 hours 02/04/19 1200 02/06/19 1002   02/04/19 1200  metroNIDAZOLE (FLAGYL) IVPB 500 mg  Status:  Discontinued     500 mg 100 mL/hr over 60 Minutes Intravenous Every 8 hours 02/04/19 1146 02/06/19 1002   02/04/19 0930  ceFEPIme (MAXIPIME) 2 g in sodium chloride 0.9 % 100 mL IVPB     2 g 200 mL/hr over 30 Minutes Intravenous Every 8 hours 02/04/19 0926 02/08/19 2359   02/03/19 1200  meropenem (MERREM) 1 g in sodium chloride 0.9 % 100 mL IVPB  Status:  Discontinued     1 g 200 mL/hr over  30 Minutes Intravenous Every 8 hours 02/03/19 1054 02/04/19 0924   01/19/19 1400  cefTAZidime (FORTAZ) 2 g in sodium chloride 0.9 % 100 mL IVPB     2 g 200 mL/hr over 30 Minutes Intravenous Every 8 hours 01/19/19 1120 01/24/19 2359   01/15/19 1500  cefTAZidime (FORTAZ) 2 g in sodium chloride 0.9 % 100 mL IVPB     2 g 200 mL/hr over 30 Minutes Intravenous Every 8 hours 01/15/19 1448 01/19/19 0542   01/14/19 1400  colistimethate ((COLYMYCIN-M)) nebulized solution 150 mg  Status:  Discontinued     150 mg Inhalation 3 times daily 01/14/19 1142 01/19/19 1119   01/14/19 1300  meropenem (MERREM) 1 g in sodium chloride 0.9 % 100 mL IVPB  Status:  Discontinued     1 g 200 mL/hr over 30 Minutes Intravenous Every 8 hours 01/14/19 1235 01/15/19 1448   12/23/18 1506  vancomycin (VANCOCIN) 1-5 GM/200ML-% IVPB    Note to Pharmacy:  [facility]   : cabinet override      12/23/18 1506 12/23/18 1515   12/20/18 2200  vancomycin (VANCOCIN) IVPB 1000 mg/200 mL premix  Status:  Discontinued     1,000 mg 200 mL/hr over 60 Minutes Intravenous Every  12 hours 12/20/18 1804 12/22/18 1413   12/18/18 0800  ceFEPIme (MAXIPIME) 1 g in sodium chloride 0.9 % 100 mL IVPB  Status:  Discontinued     1 g 200 mL/hr over 30 Minutes Intravenous Every 8 hours 12/18/18 0713 12/19/18 1201   12/17/18 0530  vancomycin (VANCOCIN) 1,500 mg in sodium chloride 0.9 % 500 mL IVPB  Status:  Discontinued     1,500 mg 250 mL/hr over 120 Minutes Intravenous Every 12 hours 12/16/18 1704 12/20/18 1804   12/16/18 1730  cefTAZidime (FORTAZ) 2 g in sodium chloride 0.9 % 100 mL IVPB  Status:  Discontinued     2 g 200 mL/hr over 30 Minutes Intravenous Every 8 hours 12/16/18 1704 12/18/18 0702   12/16/18 1730  vancomycin (VANCOCIN) 2,000 mg in sodium chloride 0.9 % 500 mL IVPB     2,000 mg 250 mL/hr over 120 Minutes Intravenous  Once 12/16/18 1704 12/16/18 2030   12/07/18 1300  erythromycin 500 mg in sodium chloride 0.9 % 100 mL IVPB  Status:   Discontinued     500 mg 100 mL/hr over 60 Minutes Intravenous Every 6 hours 12/07/18 1118 12/16/18 1627   12/06/18 1500  vancomycin (VANCOCIN) 1,250 mg in sodium chloride 0.9 % 250 mL IVPB  Status:  Discontinued     1,250 mg 166.7 mL/hr over 90 Minutes Intravenous Every 12 hours 12/06/18 1436 12/07/18 1118   12/06/18 0100  vancomycin (VANCOCIN) 1,250 mg in sodium chloride 0.9 % 250 mL IVPB     1,250 mg 166.7 mL/hr over 90 Minutes Intravenous  Once 12/05/18 1054 12/06/18 0146   12/05/18 1200  vancomycin (VANCOCIN) 2,000 mg in sodium chloride 0.9 % 500 mL IVPB     2,000 mg 250 mL/hr over 120 Minutes Intravenous  Once 12/05/18 1054 12/05/18 1350   12/05/18 1200  cefTAZidime (FORTAZ) 1 g in sodium chloride 0.9 % 100 mL IVPB  Status:  Discontinued     1 g 200 mL/hr over 30 Minutes Intravenous Every 8 hours 12/05/18 1106 12/07/18 1118   11/30/18 2200  oseltamivir (TAMIFLU) 6 MG/ML suspension 30 mg     30 mg Per Tube 2 times daily 11/30/18 1348 12/03/18 2159   11/30/18 2200  ceFEPIme (MAXIPIME) 1 g in sodium chloride 0.9 % 100 mL IVPB  Status:  Discontinued     1 g 200 mL/hr over 30 Minutes Intravenous Every 12 hours 11/30/18 1350 12/01/18 1039   11/29/18 2200  ceFEPIme (MAXIPIME) 2 g in sodium chloride 0.9 % 100 mL IVPB  Status:  Discontinued     2 g 200 mL/hr over 30 Minutes Intravenous Every 12 hours 11/29/18 1645 11/30/18 1350   11/29/18 1100  oseltamivir (TAMIFLU) 6 MG/ML suspension 75 mg  Status:  Discontinued     75 mg Per Tube 2 times daily 11/29/18 1000 11/30/18 1348   11/28/18 2300  oseltamivir (TAMIFLU) capsule 75 mg  Status:  Discontinued     75 mg Oral 2 times daily 11/28/18 2238 11/29/18 1000   11/28/18 2030  oseltamivir (TAMIFLU) capsule 75 mg     75 mg Oral  Once 11/28/18 2029 11/28/18 2207   11/28/18 1745  vancomycin (VANCOCIN) 2,000 mg in sodium chloride 0.9 % 500 mL IVPB     2,000 mg 250 mL/hr over 120 Minutes Intravenous  Once 11/28/18 1741 11/28/18 2203   11/28/18 1745   piperacillin-tazobactam (ZOSYN) IVPB 3.375 g     3.375 g 100 mL/hr over 30 Minutes Intravenous  Once  11/28/18 1741 11/28/18 1834         Subjective: Patient seen and examined at bedside.  Denies any overnight fever, nausea, vomiting, diarrhea. Objective: Vitals:   02/07/19 2138 02/07/19 2141 02/08/19 0541 02/08/19 0719  BP:  140/65 (!) 155/73   Pulse:  68 71   Resp:  18 19   Temp:  97.8 F (36.6 C) 98.5 F (36.9 C)   TempSrc:   Oral   SpO2: (!) 88% 100% 98% 99%  Weight:      Height:        Intake/Output Summary (Last 24 hours) at 02/08/2019 1005 Last data filed at 02/08/2019 0500 Gross per 24 hour  Intake 1413.31 ml  Output 650 ml  Net 763.31 ml   Filed Weights   02/02/19 0257 02/03/19 0554 02/04/19 0547  Weight: 89.6 kg 120.2 kg 81.2 kg    Examination:  General exam: Appears calm and comfortable.  No acute distress.  Looks older than stated age Respiratory system: Bilateral decreased breath sounds at bases with scattered crackles.   Cardiovascular system: Rate controlled, S1-S2 heard Gastrointestinal system: Abdomen is nondistended, soft and nontender. Normal bowel sounds heard. Extremities: No edema.  Left BKA; right first toe ray amputation.     Data Reviewed: I have personally reviewed following labs and imaging studies  CBC: Recent Labs  Lab 02/03/19 0732 02/04/19 1201 02/05/19 0323 02/06/19 0413 02/07/19 0549  WBC 12.8* 12.3* 9.9 9.0 7.4  NEUTROABS  --  9.7* 7.8* 6.6 5.2  HGB 12.0* 11.8* 10.7* 9.2* 9.4*  HCT 37.7* 37.2* 33.9* 29.8* 31.2*  MCV 85.7 86.5 85.8 85.4 86.7  PLT 160 171 167 170 176   Basic Metabolic Panel: Recent Labs  Lab 02/03/19 0732 02/04/19 1201 02/05/19 0323 02/06/19 0413 02/07/19 0549  NA 133* 132* 133* 133* 134*  K 4.0 3.8 4.0 3.9 3.9  CL 96* 95* 96* 98 98  CO2 28 28 29 27 26   GLUCOSE 136* 189* 191* 175* 166*  BUN 21 31* 23 17 13   CREATININE 0.71 0.67 0.58* 0.47* 0.42*  CALCIUM 9.1 8.6* 8.4* 8.1* 8.6*  MG  --   --   1.8 1.8 1.8   GFR: Estimated Creatinine Clearance: 106.4 mL/min (A) (by C-G formula based on SCr of 0.42 mg/dL (L)). Liver Function Tests: Recent Labs  Lab 02/04/19 1201  AST 11*  ALT 14  ALKPHOS 77  BILITOT 0.6  PROT 6.6  ALBUMIN 2.5*   No results for input(s): LIPASE, AMYLASE in the last 168 hours. No results for input(s): AMMONIA in the last 168 hours. Coagulation Profile: No results for input(s): INR, PROTIME in the last 168 hours. Cardiac Enzymes: No results for input(s): CKTOTAL, CKMB, CKMBINDEX, TROPONINI in the last 168 hours. BNP (last 3 results) No results for input(s): PROBNP in the last 8760 hours. HbA1C: No results for input(s): HGBA1C in the last 72 hours. CBG: Recent Labs  Lab 02/07/19 0803 02/07/19 1222 02/07/19 1656 02/07/19 2138 02/08/19 0816  GLUCAP 149* 154* 127* 127* 100*   Lipid Profile: No results for input(s): CHOL, HDL, LDLCALC, TRIG, CHOLHDL, LDLDIRECT in the last 72 hours. Thyroid Function Tests: No results for input(s): TSH, T4TOTAL, FREET4, T3FREE, THYROIDAB in the last 72 hours. Anemia Panel: No results for input(s): VITAMINB12, FOLATE, FERRITIN, TIBC, IRON, RETICCTPCT in the last 72 hours. Sepsis Labs: Recent Labs  Lab 02/04/19 1201  PROCALCITON 0.26    Recent Results (from the past 240 hour(s))  Expectorated sputum assessment w rflx to resp cult  Status: None   Collection Time: 02/04/19 10:44 AM  Result Value Ref Range Status   Specimen Description SPUTUM  Final   Special Requests NONE  Final   Sputum evaluation   Final    THIS SPECIMEN IS ACCEPTABLE FOR SPUTUM CULTURE Performed at [facility], [address], [place], [place] [address]    Report Status 02/04/2019 FINAL  Final  Culture, respiratory     Status: None   Collection Time: 02/04/19 10:44 AM  Result Value Ref Range Status   Specimen Description SPUTUM  Final   Special Requests NONE Reflexed from [id number]  Final   Gram Stain   Final    ABUNDANT WBC  PRESENT, PREDOMINANTLY PMN RARE SQUAMOUS EPITHELIAL CELLS PRESENT RARE GRAM POSITIVE RODS Performed at [facility], [address], [place], [place] [address]    Culture FEW PSEUDOMONAS AERUGINOSA  Final   Report Status 02/07/2019 FINAL  Final   Organism ID, Bacteria PSEUDOMONAS AERUGINOSA  Final      Susceptibility   Pseudomonas aeruginosa - MIC*    CEFTAZIDIME 4 SENSITIVE Sensitive     CIPROFLOXACIN <=0.25 SENSITIVE Sensitive     GENTAMICIN <=1 SENSITIVE Sensitive     IMIPENEM 2 SENSITIVE Sensitive     PIP/TAZO 16 SENSITIVE Sensitive     CEFEPIME 4 SENSITIVE Sensitive     * FEW PSEUDOMONAS AERUGINOSA  Culture, blood (routine x 2)     Status: None (Preliminary result)   Collection Time: 02/04/19 11:52 AM  Result Value Ref Range Status   Specimen Description BLOOD LEFT ARM  Final   Special Requests   Final    BOTTLES DRAWN AEROBIC ONLY Blood Culture adequate volume   Culture   Final    NO GROWTH 3 DAYS Performed at [facility], [address] [place], [place] [address]    Report Status PENDING  Incomplete  Culture, blood (routine x 2)     Status: None (Preliminary result)   Collection Time: 02/04/19 12:00 PM  Result Value Ref Range Status   Specimen Description BLOOD LEFT ARM  Final   Special Requests   Final    BOTTLES DRAWN AEROBIC ONLY Blood Culture adequate volume   Culture   Final    NO GROWTH 3 DAYS Performed at [facility], [address], [place], [place] [address]    Report Status PENDING  Incomplete  Culture, Urine     Status: None   Collection Time: 02/04/19  1:54 PM  Result Value Ref Range Status   Specimen Description URINE, CLEAN CATCH  Final   Special Requests NONE  Final   Culture   Final    NO GROWTH Performed at [facility], [address] [place], [place] [address]    Report Status 02/05/2019 FINAL  Final         Radiology Studies: No results found.      Scheduled Meds: . arformoterol  15 mcg Nebulization  BID  . aspirin  81 mg Per Tube Daily  . atorvastatin  40 mg Per Tube q1800  . budesonide (PULMICORT) nebulizer solution  0.5 mg Nebulization BID  . carvedilol  3.125 mg Per Tube BID WC  . collagenase   Topical Daily  . enoxaparin (LOVENOX) injection  40 mg Subcutaneous Q24H  . famotidine  20 mg Per Tube BID  . folic acid  1 mg Per Tube Daily  . guaiFENesin  5 mL Per Tube Q12H  .  insulin aspart  0-20 Units Subcutaneous TID WC  . insulin detemir  25 Units Subcutaneous BID  . multivitamin  15 mL Per Tube Daily  . nutrition supplement (JUVEN)  1 packet Per Tube BID BM  . tapentadol  50 mg Oral QID  . thiamine  100 mg Per Tube Daily   Continuous Infusions: . sodium chloride 10 mL/hr at 01/24/19 2000  . ceFEPime (MAXIPIME) IV 2 g (02/08/19 0936)  . feeding supplement (OSMOLITE 1.2 CAL) 1,000 mL (02/07/19 1745)     LOS: 72 days        Glade Lloyd, MD [facility] 02/08/2019, 10:05 AM

## 2019-02-08 NOTE — Progress Notes (Signed)
Dressing on sacrum was changed; patient tolerated well. Will continue to monitor.

## 2019-02-09 LAB — CBC WITH DIFFERENTIAL/PLATELET
Abs Immature Granulocytes: 0.01 K/uL (ref 0.00–0.07)
Basophils Absolute: 0.1 K/uL (ref 0.0–0.1)
Basophils Relative: 1 %
Eosinophils Absolute: 0.3 K/uL (ref 0.0–0.5)
Eosinophils Relative: 6 %
HCT: 31.6 % — ABNORMAL LOW (ref 39.0–52.0)
Hemoglobin: 9 g/dL — ABNORMAL LOW (ref 13.0–17.0)
Immature Granulocytes: 0 %
Lymphocytes Relative: 16 %
Lymphs Abs: 0.8 K/uL (ref 0.7–4.0)
MCH: 26 pg (ref 26.0–34.0)
MCHC: 28.5 g/dL — ABNORMAL LOW (ref 30.0–36.0)
MCV: 91.3 fL (ref 80.0–100.0)
Monocytes Absolute: 0.5 K/uL (ref 0.1–1.0)
Monocytes Relative: 10 %
Neutro Abs: 3.2 K/uL (ref 1.7–7.7)
Neutrophils Relative %: 67 %
Platelets: 224 K/uL (ref 150–400)
RBC: 3.46 MIL/uL — ABNORMAL LOW (ref 4.22–5.81)
RDW: 18.4 % — ABNORMAL HIGH (ref 11.5–15.5)
WBC: 4.8 K/uL (ref 4.0–10.5)
nRBC: 0 % (ref 0.0–0.2)

## 2019-02-09 LAB — COMPREHENSIVE METABOLIC PANEL WITH GFR
ALT: 8 U/L (ref 0–44)
AST: 19 U/L (ref 15–41)
Albumin: 3 g/dL — ABNORMAL LOW (ref 3.5–5.0)
Alkaline Phosphatase: 108 U/L (ref 38–126)
Anion gap: 10 (ref 5–15)
BUN: 14 mg/dL (ref 8–23)
CO2: 31 mmol/L (ref 22–32)
Calcium: 9.6 mg/dL (ref 8.9–10.3)
Chloride: 100 mmol/L (ref 98–111)
Creatinine, Ser: 1.11 mg/dL (ref 0.61–1.24)
GFR calc Af Amer: >60 mL/min
GFR calc non Af Amer: >60 mL/min
Glucose, Bld: 84 mg/dL (ref 70–99)
Potassium: 3.1 mmol/L — ABNORMAL LOW (ref 3.5–5.1)
Sodium: 141 mmol/L (ref 135–145)
Total Bilirubin: 1.7 mg/dL — ABNORMAL HIGH (ref 0.3–1.2)
Total Protein: 5.6 g/dL — ABNORMAL LOW (ref 6.5–8.1)

## 2019-02-09 LAB — CULTURE, BLOOD (ROUTINE X 2)
Culture: NO GROWTH
Culture: NO GROWTH
Special Requests: ADEQUATE
Special Requests: ADEQUATE

## 2019-02-09 LAB — GLUCOSE, CAPILLARY
Glucose-Capillary: 95 mg/dL (ref 70–99)
Glucose-Capillary: 138 mg/dL — ABNORMAL HIGH (ref 70–99)
Glucose-Capillary: 105 mg/dL — ABNORMAL HIGH (ref 70–99)
Glucose-Capillary: 180 mg/dL — ABNORMAL HIGH (ref 70–99)

## 2019-02-09 LAB — MAGNESIUM: Magnesium: 2.4 mg/dL (ref 1.7–2.4)

## 2019-02-09 MED ORDER — POTASSIUM CHLORIDE 20 MEQ/15ML (10%) PO SOLN
40.0000 meq | ORAL | Status: AC
  Administered 2019-02-09 (×2): 40 meq
  Filled 2019-02-09 (×2): qty 30

## 2019-02-09 NOTE — TOC Progression Note (Signed)
Transition of Care (TOC) - Progression Note    Patient Details  Name: Antonio [name]. MRN: [id number] Date of Birth: 07-14-1956  Transition of Care [facility]) CM/SW Contact  Mearl Latin, [place] Phone Number: [phone] 02/09/2019, 10:28 AM  Clinical Narrative:    [facility] unable to accept patient. [facility] liaison reviewing patient.    Expected Discharge Plan: Skilled Nursing Facility    Expected Discharge Plan and Services Expected Discharge Plan: Skilled Nursing Facility Discharge Planning Services: CM Consult                               Social Determinants of Health (SDOH) Interventions    Readmission Risk Interventions 30 Day Unplanned Readmission Risk Score     ED to Hosp-Admission (Current) from 11/28/2018 in [facility]  30 Day Unplanned Readmission Risk Score (%)  39 Filed at 02/09/2019 0801     This score is the patient's risk of an unplanned readmission within 30 days of being discharged (0 -100%). The score is based on dignosis, age, lab data, medications, orders, and past utilization.   Low:  0-14.9   Medium: 15-21.9   High: 22-29.9   Extreme: 30 and above       No flowsheet data found.

## 2019-02-09 NOTE — Progress Notes (Signed)
Physical Therapy Treatment Patient Details Name: Antonio [name]. MRN: [id number] DOB: 1956-06-18 Today's Date: 02/09/2019    History of Present Illness Pt is a 63 y.o. male admitted 11/28/18 with flu A and PNA; acute respiratory failure intubated 1/4. Trach placed 1/16. PEG placed 1/28. Hospital course complicared by acute MI, CHF, cardiogenic shock, and mucous plugging. Also with sacral wound.  De-cannulated 02/02/19. PMH includes DM, HTN, HLD, CAD, L BKA, R transmet amputation.    PT Comments    Goals updated.  Pt able to walk with RW min guard assist overall with cues for safe use of walker.  He did well on RA keeping sats at 92% during mobility.  He is on 2 L O2 [place] at rest.  Pt continues to report sacral pain during most mobility.  He is self limiting and at a high fall risk with elderly parents being his only support at discharge.  He would need to be mod I for safe d/c home and remains appropriate for SNF level rehab at this time. PT will continue to follow acutely for safe mobility progression  Follow Up Recommendations  SNF     Equipment Recommendations  Rolling walker with 5" wheels;3in1 (PT)    Recommendations for Other Services   NA     Precautions / Restrictions Precautions Precautions: Fall Precaution Comments: PEG, monitor O2 sats Required Braces or Orthoses: Other Brace Other Brace: L LE prosthesis    Mobility  Bed Mobility Overal bed mobility: Needs Assistance Bed Mobility: Sidelying to Sit;Rolling Rolling: Supervision Sidelying to sit: Supervision       General bed mobility comments: Supervision for safety, heavy use of railing.   Transfers Overall transfer level: Needs assistance Equipment used: Rolling walker (2 wheeled) Transfers: Sit to/from Stand Sit to Stand: Min guard         General transfer comment: Min guard assist for safety, bed elevated per pt request  Ambulation/Gait Ambulation/Gait assistance: Min guard Gait Distance (Feet): 110  Feet Assistive device: Rolling walker (2 wheeled) Gait Pattern/deviations: Step-through pattern Gait velocity: decreased Gait velocity interpretation: 1.31 - 2.62 ft/sec, indicative of limited community ambulator General Gait Details: Pt with mildly unsteady gait pattern, flexed L knee and too far from RW, when turning cues to keep feet inside of RW for safety.  Pt generally does what he wants and may or may not apply suggestions from therapist.           Balance Overall balance assessment: Needs assistance Sitting-balance support: Feet supported;No upper extremity supported Sitting balance-Leahy Scale: Good Sitting balance - Comments: Pt reports when he bends over (even on inflated air mattress) he gets pain in his sacral wound.     Standing balance support: Bilateral upper extremity supported Standing balance-Leahy Scale: Poor Standing balance comment: reliant on RW, supervision once standing with RW statically.                             Cognition Arousal/Alertness: Awake/alert Behavior During Therapy: Impulsive(a bit impulsive) Overall Cognitive Status: Within Functional Limits for tasks assessed                                 General Comments: Not specifically tested.          General Comments General comments (skin integrity, edema, etc.): O2 sats 92% on RA.        Pertinent  Vitals/Pain Pain Assessment: Faces Faces Pain Scale: Hurts even more Pain Location: Sacral wound Pain Descriptors / Indicators: Grimacing;Discomfort Pain Intervention(s): Limited activity within patient's tolerance;Monitored during session;Repositioned           PT Goals (current goals can now be found in the care plan section) Acute Rehab PT Goals Patient Stated Goal: to get stronger so he can go home.  PT Goal Formulation: With patient Time For Goal Achievement: 02/23/19(mobility goals) Potential to Achieve Goals: Good Progress towards PT goals: Progressing  toward goals    Frequency    Min 2X/week      PT Plan Frequency needs to be updated       AM-PAC PT "6 Clicks" Mobility   Outcome Measure  Help needed turning from your back to your side while in a flat bed without using bedrails?: A Little Help needed moving from lying on your back to sitting on the side of a flat bed without using bedrails?: A Little Help needed moving to and from a bed to a chair (including a wheelchair)?: A Little Help needed standing up from a chair using your arms (e.g., wheelchair or bedside chair)?: A Little Help needed to walk in hospital room?: A Little Help needed climbing 3-5 steps with a railing? : A Lot 6 Click Score: 17    End of Session   Activity Tolerance: Patient limited by fatigue Patient left: Other (comment)(in bathroom with instructions to call when he is done)   PT Visit Diagnosis: Muscle weakness (generalized) (M62.81);Unsteadiness on feet (R26.81);Difficulty in walking, not elsewhere classified (R26.2)     Time: 2751-7001 PT Time Calculation (min) (ACUTE ONLY): 32 min  Charges:  $Gait Training: 8-22 mins $Therapeutic Activity: 8-22 mins                    Glori Machnik B. Tiquan Bouch, PT, DPT  Acute Rehabilitation [phone] pager [phone]) [phone] office   Lurena Joiner B Lyza Houseworth 02/09/2019, 2:59 PM

## 2019-02-09 NOTE — Progress Notes (Signed)
Patient ID: Antonio [name]., male   DOB: 01-28-1956, 63 y.o.   MRN: [id number]  PROGRESS NOTE    Antonio [name].  GYB:[id number] DOB: [birth year] DOA: 11/28/2018 PCP: Dema Severin, NP   Brief Narrative:  63 year old male was admitted on 11/28/2018 with influenza A causing respiratory failure and cardiogenic shock.  Patient has had prolonged hospitalization with mechanical ventilation and subsequent tracheostomy placed on December 11, 2018.  He was treated with broad-spectrum antibiotics for his Pseudomonas from his sputum culture.  He was switched to cuffless trach on 01/27/2019.  He also had PEG tube placement.  Significant events: 1/6 self extubated  1/7 re-intubated  1/8 self extubated 1/9 acute respiratory failure, mucus plugging right lower lobe, re-intubated, bronchoscopy 1/10 New fever T Max 103.1, antibiotics resumed with ceftaz and vancomycin 1/11 Hemodynamically seems to be improving. Very hypernatremic. X-ray improving. Adding free water, one-time Lasix, follow-up chemistry a.m. Hoping to initiate weaning efforts on 1/12 1/16 Continuing diuresis and ongoing ventilatory support. Working on weaning but still has significant work of breathing. Plan has been to proceed with tracheostomy. Hopefully we can continue ongoing diuresis and continue to decrease sedation 1/20 still struggling to wean. Secretions and anxiety are barriers.  1/21 trach revision to 6 distal XLT, fever, increased secretions, abx broadened / bronch 1/22 ongoing thick secretions 1/24 Failed SBT, low grade fever, Per CXR persistent pleural effusions / basilar consolidation atelectasis vs pneumonia per bases 1/25 Mucus plugged with slow recovery after chest PT and Wound Care 2/3  first time weaned for greater than 15 minutes (previously failed due to agitation, secretions, tachypnea) 2/10 TRH assumed care 2/11 trach changed to a #5 cuffless per PCCM  2/13 trach changed to #4 shiley cuffless  2/13  afternoon - pulled out new trach and "disoldged" PEG - both reinserted/repositioned  2/13 PM > 2/14 AM acute hypoxic failure - emergent bedside bronch > trach obstructed - #6 cuffed trach placed > back to ICU on vent  2/14 trach changed to #5 cuffed 2/15 TRH resumed care - resp distress > back on vent  2/15 Failed ATC with increased WOB 2/16 Failed ATC with increased WOB  2/21 back on pressure support 2/24 back on TC  01/27/19 - 5  XLT cuffless trach  01/29/2019: Size 4 cuff less tracheostomy placed 01/30/2019: Capping trials initiated 02/02/2019: Tracheostomy decannulated  Assessment & Plan:   Active Problems:   Chest pain   SIRS (systemic inflammatory response syndrome) (HCC)   Acute respiratory failure with hypoxemia (HCC)   Pressure injury of skin   Influenza A virus present   Pneumonia   Tracheostomy status (HCC)   Acute pulmonary edema (HCC)   Hypoxia   Fever, unspecified   Tachycardia   Acute on chronic respiratory failure (HCC)   Hypoxemia   Phlegm in throat   Dysphagia   Status post tracheostomy (HCC)   HCAP (healthcare-associated pneumonia)   Diabetes mellitus type 2 in nonobese (HCC)   Essential hypertension   S/P BKA (below knee amputation) unilateral, left (HCC)   Tachypnea   Pain   Agitation   Acute blood loss anemia   Acute respiratory failure (HCC)   Airway trauma  Fever -No temperature spike over the last few days.  Cultures negative so far.  Chest x-ray was negative for infiltrates.  -Has completed 5-day course of cefepime.  Antibiotics discontinued on 02/08/2019.  Monitor off antibiotics.  Leukocytosis -Resolved.  Hypokalemia--replace.  Acute hypoxic respiratory failure secondary to influenza A -Patient  was under the care of ICU, required multiple intubations and subsequently tracheostomy -Sputum culture from trach had grown Corynebacterium stratum and Pseudomonas recently.  He was treated with ceftazidime for the same. -He has been switched to  cuffless trach on 01/27/2019 by pulmonary.  He then did well with capping trials and subsequently tracheostomy was decannulated on 02/02/2019.  As per pulmonary, if tracheostomy area does not close off in 2 weeks, patient will need ENT eval for tracheocutaneous fistula.  For now, a regular Band-Aid can be used until stoma is completely closed.   Pulmonary has signed off.  Sacral/coccygeal stage IV pressure injury -Wound care evaluation appreciated.   -General surgery evaluation has been appreciated: No need for any surgical debridement at this time.  Continue wound care and hydrotherapy.  MRI of the spine was negative for any evidence of osteomyelitis in the lumbosacral spine.    Acute systolic heart failure -EF was 20 to 25% on echo on 11/29/2018 but improved to 60 to 65% on 12/24/2018. -Most likely secondary to sepsis -No plans for cardiac cath per cardiology -Continue beta-blocker.  Continue strict input and output.  Dysphagia -Status post PEG tube placement.  Continue PEG tube feeding. -SLP following the patient and is recommending dysphagia 2 diet.  Patient will need repeat MBS as an outpatient not before 6 to 8 weeks.  Acute metabolic encephalopathy -Resolved  Deconditioning - will need SNF placement.  Social worker following  Diabetes mellitus type 2 -Monitor CBGs.  Continue Levemir with sliding scale insulin  DVT prophylaxis: Lovenox Code Status: Full code Family Communication: None Disposition Plan: SNF once bed is available.  Consultants: PCCM/cardiology/general surgery  Procedures:  As described above  Antimicrobials:  Anti-infectives (From admission, onward)   Start     Dose/Rate Route Frequency Ordered Stop   02/04/19 1300  vancomycin (VANCOCIN) 1,250 mg in sodium chloride 0.9 % 250 mL IVPB  Status:  Discontinued     1,250 mg 166.7 mL/hr over 90 Minutes Intravenous Every 12 hours 02/04/19 1200 02/06/19 1002   02/04/19 1200  metroNIDAZOLE (FLAGYL) IVPB 500 mg  Status:   Discontinued     500 mg 100 mL/hr over 60 Minutes Intravenous Every 8 hours 02/04/19 1146 02/06/19 1002   02/04/19 0930  ceFEPIme (MAXIPIME) 2 g in sodium chloride 0.9 % 100 mL IVPB     2 g 200 mL/hr over 30 Minutes Intravenous Every 8 hours 02/04/19 0926 02/08/19 2359   02/03/19 1200  meropenem (MERREM) 1 g in sodium chloride 0.9 % 100 mL IVPB  Status:  Discontinued     1 g 200 mL/hr over 30 Minutes Intravenous Every 8 hours 02/03/19 1054 02/04/19 0924   01/19/19 1400  cefTAZidime (FORTAZ) 2 g in sodium chloride 0.9 % 100 mL IVPB     2 g 200 mL/hr over 30 Minutes Intravenous Every 8 hours 01/19/19 1120 01/24/19 2359   01/15/19 1500  cefTAZidime (FORTAZ) 2 g in sodium chloride 0.9 % 100 mL IVPB     2 g 200 mL/hr over 30 Minutes Intravenous Every 8 hours 01/15/19 1448 01/19/19 0542   01/14/19 1400  colistimethate ((COLYMYCIN-M)) nebulized solution 150 mg  Status:  Discontinued     150 mg Inhalation 3 times daily 01/14/19 1142 01/19/19 1119   01/14/19 1300  meropenem (MERREM) 1 g in sodium chloride 0.9 % 100 mL IVPB  Status:  Discontinued     1 g 200 mL/hr over 30 Minutes Intravenous Every 8 hours 01/14/19 1235 01/15/19 1448  12/23/18 1506  vancomycin (VANCOCIN) 1-5 GM/200ML-% IVPB    Note to Pharmacy:  [facility]   : cabinet override      12/23/18 1506 12/23/18 1515   12/20/18 2200  vancomycin (VANCOCIN) IVPB 1000 mg/200 mL premix  Status:  Discontinued     1,000 mg 200 mL/hr over 60 Minutes Intravenous Every 12 hours 12/20/18 1804 12/22/18 1413   12/18/18 0800  ceFEPIme (MAXIPIME) 1 g in sodium chloride 0.9 % 100 mL IVPB  Status:  Discontinued     1 g 200 mL/hr over 30 Minutes Intravenous Every 8 hours 12/18/18 0713 12/19/18 1201   12/17/18 0530  vancomycin (VANCOCIN) 1,500 mg in sodium chloride 0.9 % 500 mL IVPB  Status:  Discontinued     1,500 mg 250 mL/hr over 120 Minutes Intravenous Every 12 hours 12/16/18 1704 12/20/18 1804   12/16/18 1730  cefTAZidime (FORTAZ) 2 g in  sodium chloride 0.9 % 100 mL IVPB  Status:  Discontinued     2 g 200 mL/hr over 30 Minutes Intravenous Every 8 hours 12/16/18 1704 12/18/18 0702   12/16/18 1730  vancomycin (VANCOCIN) 2,000 mg in sodium chloride 0.9 % 500 mL IVPB     2,000 mg 250 mL/hr over 120 Minutes Intravenous  Once 12/16/18 1704 12/16/18 2030   12/07/18 1300  erythromycin 500 mg in sodium chloride 0.9 % 100 mL IVPB  Status:  Discontinued     500 mg 100 mL/hr over 60 Minutes Intravenous Every 6 hours 12/07/18 1118 12/16/18 1627   12/06/18 1500  vancomycin (VANCOCIN) 1,250 mg in sodium chloride 0.9 % 250 mL IVPB  Status:  Discontinued     1,250 mg 166.7 mL/hr over 90 Minutes Intravenous Every 12 hours 12/06/18 1436 12/07/18 1118   12/06/18 0100  vancomycin (VANCOCIN) 1,250 mg in sodium chloride 0.9 % 250 mL IVPB     1,250 mg 166.7 mL/hr over 90 Minutes Intravenous  Once 12/05/18 1054 12/06/18 0146   12/05/18 1200  vancomycin (VANCOCIN) 2,000 mg in sodium chloride 0.9 % 500 mL IVPB     2,000 mg 250 mL/hr over 120 Minutes Intravenous  Once 12/05/18 1054 12/05/18 1350   12/05/18 1200  cefTAZidime (FORTAZ) 1 g in sodium chloride 0.9 % 100 mL IVPB  Status:  Discontinued     1 g 200 mL/hr over 30 Minutes Intravenous Every 8 hours 12/05/18 1106 12/07/18 1118   11/30/18 2200  oseltamivir (TAMIFLU) 6 MG/ML suspension 30 mg     30 mg Per Tube 2 times daily 11/30/18 1348 12/03/18 2159   11/30/18 2200  ceFEPIme (MAXIPIME) 1 g in sodium chloride 0.9 % 100 mL IVPB  Status:  Discontinued     1 g 200 mL/hr over 30 Minutes Intravenous Every 12 hours 11/30/18 1350 12/01/18 1039   11/29/18 2200  ceFEPIme (MAXIPIME) 2 g in sodium chloride 0.9 % 100 mL IVPB  Status:  Discontinued     2 g 200 mL/hr over 30 Minutes Intravenous Every 12 hours 11/29/18 1645 11/30/18 1350   11/29/18 1100  oseltamivir (TAMIFLU) 6 MG/ML suspension 75 mg  Status:  Discontinued     75 mg Per Tube 2 times daily 11/29/18 1000 11/30/18 1348   11/28/18 2300   oseltamivir (TAMIFLU) capsule 75 mg  Status:  Discontinued     75 mg Oral 2 times daily 11/28/18 2238 11/29/18 1000   11/28/18 2030  oseltamivir (TAMIFLU) capsule 75 mg     75 mg Oral  Once 11/28/18 2029 11/28/18  2207   11/28/18 1745  vancomycin (VANCOCIN) 2,000 mg in sodium chloride 0.9 % 500 mL IVPB     2,000 mg 250 mL/hr over 120 Minutes Intravenous  Once 11/28/18 1741 11/28/18 2203   11/28/18 1745  piperacillin-tazobactam (ZOSYN) IVPB 3.375 g     3.375 g 100 mL/hr over 30 Minutes Intravenous  Once 11/28/18 1741 11/28/18 1834      Subjective: Patient seen and examined at bedside.  Denies any overnight worsening fever, nausea, vomiting or diarrhea.   Objective: Vitals:   02/08/19 2104 02/08/19 2111 02/09/19 0519 02/09/19 0835  BP:  (!) 152/77 139/73 (!) 162/84  Pulse:  71 66 72  Resp:      Temp:  98 F (36.7 C) (!) 97.5 F (36.4 C)   TempSrc:  Oral Oral   SpO2: 96% 98% 98%   Weight:      Height:        Intake/Output Summary (Last 24 hours) at 02/09/2019 1047 Last data filed at 02/09/2019 0602 Gross per 24 hour  Intake 621.5 ml  Output 1925 ml  Net -1303.5 ml   Filed Weights   02/02/19 0257 02/03/19 0554 02/04/19 0547  Weight: 89.6 kg 120.2 kg 81.2 kg    Examination:  General exam: Appears calm and comfortable.  No acute distress.  Looks older than stated age.  Still has hoarse voice. Respiratory system: Bilateral decreased breath sounds at bases with scattered crackles.  No wheezing Cardiovascular system: Rate controlled, S1-S2 heard Gastrointestinal system: Abdomen is nondistended, soft and nontender. Normal bowel sounds heard. Extremities: No edema.  Left BKA; right first toe ray amputation.     Data Reviewed: I have personally reviewed following labs and imaging studies  CBC: Recent Labs  Lab 02/04/19 1201 02/05/19 0323 02/06/19 0413 02/07/19 0549 02/09/19 0502  WBC 12.3* 9.9 9.0 7.4 4.8  NEUTROABS 9.7* 7.8* 6.6 5.2 3.2  HGB 11.8* 10.7* 9.2* 9.4*  9.0*  HCT 37.2* 33.9* 29.8* 31.2* 31.6*  MCV 86.5 85.8 85.4 86.7 91.3  PLT 171 167 170 176 224   Basic Metabolic Panel: Recent Labs  Lab 02/04/19 1201 02/05/19 0323 02/06/19 0413 02/07/19 0549 02/09/19 0502  NA 132* 133* 133* 134* 141  K 3.8 4.0 3.9 3.9 3.1*  CL 95* 96* 98 98 100  CO2 GLUCOSE 189* 191* 175* 166* 84  BUN 31* CREATININE 0.67 0.58* 0.47* 0.42* 1.11  CALCIUM 8.6* 8.4* 8.1* 8.6* 9.6  MG  --  1.8 1.8 1.8 2.4   GFR: Estimated Creatinine Clearance: 76.7 mL/min (by C-G formula based on SCr of 1.11 mg/dL). Liver Function Tests: Recent Labs  Lab 02/04/19 1201 02/09/19 0502  AST 11* 19  ALT 14 8  ALKPHOS 77 108  BILITOT 0.6 1.7*  PROT 6.6 5.6*  ALBUMIN 2.5* 3.0*   No results for input(s): LIPASE, AMYLASE in the last 168 hours. No results for input(s): AMMONIA in the last 168 hours. Coagulation Profile: No results for input(s): INR, PROTIME in the last 168 hours. Cardiac Enzymes: No results for input(s): CKTOTAL, CKMB, CKMBINDEX, TROPONINI in the last 168 hours. BNP (last 3 results) No results for input(s): PROBNP in the last 8760 hours. HbA1C: No results for input(s): HGBA1C in the last 72 hours. CBG: Recent Labs  Lab 02/08/19 0816 02/08/19 1218 02/08/19 1723 02/08/19 2037 02/09/19 0746  GLUCAP 100* 135* 128* 120* 95   Lipid Profile: No results for input(s): CHOL, HDL, LDLCALC, TRIG, CHOLHDL,  LDLDIRECT in the last 72 hours. Thyroid Function Tests: No results for input(s): TSH, T4TOTAL, FREET4, T3FREE, THYROIDAB in the last 72 hours. Anemia Panel: No results for input(s): VITAMINB12, FOLATE, FERRITIN, TIBC, IRON, RETICCTPCT in the last 72 hours. Sepsis Labs: Recent Labs  Lab 02/04/19 1201  PROCALCITON 0.26    Recent Results (from the past 240 hour(s))  Expectorated sputum assessment w rflx to resp cult     Status: None   Collection Time: 02/04/19 10:44 AM  Result Value Ref Range Status   Specimen Description  SPUTUM  Final   Special Requests NONE  Final   Sputum evaluation   Final    THIS SPECIMEN IS ACCEPTABLE FOR SPUTUM CULTURE Performed at [facility], [address] [place], [place] [address]    Report Status 02/04/2019 FINAL  Final  Culture, respiratory     Status: None   Collection Time: 02/04/19 10:44 AM  Result Value Ref Range Status   Specimen Description SPUTUM  Final   Special Requests NONE Reflexed from [id number]  Final   Gram Stain   Final    ABUNDANT WBC PRESENT, PREDOMINANTLY PMN RARE SQUAMOUS EPITHELIAL CELLS PRESENT RARE GRAM POSITIVE RODS Performed at [facility], [address], [place], [place] [address]    Culture FEW PSEUDOMONAS AERUGINOSA  Final   Report Status 02/07/2019 FINAL  Final   Organism ID, Bacteria PSEUDOMONAS AERUGINOSA  Final      Susceptibility   Pseudomonas aeruginosa - MIC*    CEFTAZIDIME 4 SENSITIVE Sensitive     CIPROFLOXACIN <=0.25 SENSITIVE Sensitive     GENTAMICIN <=1 SENSITIVE Sensitive     IMIPENEM 2 SENSITIVE Sensitive     PIP/TAZO 16 SENSITIVE Sensitive     CEFEPIME 4 SENSITIVE Sensitive     * FEW PSEUDOMONAS AERUGINOSA  Culture, blood (routine x 2)     Status: None   Collection Time: 02/04/19 11:52 AM  Result Value Ref Range Status   Specimen Description BLOOD LEFT ARM  Final   Special Requests   Final    BOTTLES DRAWN AEROBIC ONLY Blood Culture adequate volume   Culture   Final    NO GROWTH 5 DAYS Performed at [facility], [address], [place], [place] [address]    Report Status 02/09/2019 FINAL  Final  Culture, blood (routine x 2)     Status: None   Collection Time: 02/04/19 12:00 PM  Result Value Ref Range Status   Specimen Description BLOOD LEFT ARM  Final   Special Requests   Final    BOTTLES DRAWN AEROBIC ONLY Blood Culture adequate volume   Culture   Final    NO GROWTH 5 DAYS Performed at [facility], [address], [place], [place] [address]    Report Status 02/09/2019 FINAL  Final   Culture, Urine     Status: None   Collection Time: 02/04/19  1:54 PM  Result Value Ref Range Status   Specimen Description URINE, CLEAN CATCH  Final   Special Requests NONE  Final   Culture   Final    NO GROWTH Performed at [facility], [address] [place], [place] [address]    Report Status 02/05/2019 FINAL  Final         Radiology Studies: No results found.      Scheduled Meds: . arformoterol  15 mcg Nebulization BID  . aspirin  81 mg Per Tube Daily  . atorvastatin  40  mg Per Tube q1800  . budesonide (PULMICORT) nebulizer solution  0.5 mg Nebulization BID  . carvedilol  3.125 mg Per Tube BID WC  . collagenase   Topical Daily  . enoxaparin (LOVENOX) injection  40 mg Subcutaneous Q24H  . famotidine  20 mg Per Tube BID  . folic acid  1 mg Per Tube Daily  . guaiFENesin  5 mL Per Tube Q12H  . insulin aspart  0-20 Units Subcutaneous TID WC  . insulin detemir  25 Units Subcutaneous BID  . multivitamin  15 mL Per Tube Daily  . nutrition supplement (JUVEN)  1 packet Per Tube BID BM  . potassium chloride  40 mEq Per Tube Q4H  . tapentadol  50 mg Oral QID  . thiamine  100 mg Per Tube Daily   Continuous Infusions: . sodium chloride 10 mL/hr at 01/24/19 2000  . feeding supplement (OSMOLITE 1.2 CAL) 1,000 mL (02/08/19 1343)     LOS: 73 days        Glade Lloyd, MD [facility] 02/09/2019, 10:47 AM

## 2019-02-09 NOTE — Progress Notes (Signed)
[facility] Progress Note     Subjective: CC-  PT in room for hydrotherapy.  Objective: Vital signs in last 24 hours: Temp:  [97.5 F (36.4 C)-98.2 F (36.8 C)] 97.5 F (36.4 C) (03/16 0519) Pulse Rate:  [66-72] 72 (03/16 0835) BP: (139-162)/(67-84) 162/84 (03/16 0835) SpO2:  [96 %-98 %] 98 % (03/16 0519) Last BM Date: 02/05/19  Intake/Output from previous day: 03/15 0701 - 03/16 0700 In: 841.5 [P.O.:240; NG/GT:301.5; IV Piggyback:300] Out: 2325 [Urine:2325] Intake/Output this shift: No intake/output data recorded.  PE: Gen:  Alert, NAD, pleasant HEENT: EOM's intact, pupils equal and round Pulm: effort normal GU: sacral wound with significant amount of slough, wound tunnels about 9cm distally. Copious drainage and some pus noted on dressing      Lab Results:  Recent Labs    02/07/19 0549 02/09/19 0502  WBC 7.4 4.8  HGB 9.4* 9.0*  HCT 31.2* 31.6*  PLT 176 224   BMET Recent Labs    02/07/19 0549 02/09/19 0502  NA 134* 141  K 3.9 3.1*  CL 98 100  CO2 26 31  GLUCOSE 166* 84  BUN 13 14  CREATININE 0.42* 1.11  CALCIUM 8.6* 9.6   PT/INR No results for input(s): LABPROT, INR in the last 72 hours. CMP     Component Value Date/Time   NA 141 02/09/2019 0502   K 3.1 (L) 02/09/2019 0502   CL 100 02/09/2019 0502   CO2 31 02/09/2019 0502   GLUCOSE 84 02/09/2019 0502   BUN 14 02/09/2019 0502   CREATININE 1.11 02/09/2019 0502   CALCIUM 9.6 02/09/2019 0502   PROT 5.6 (L) 02/09/2019 0502   ALBUMIN 3.0 (L) 02/09/2019 0502   AST 19 02/09/2019 0502   ALT 8 02/09/2019 0502   ALKPHOS 108 02/09/2019 0502   BILITOT 1.7 (H) 02/09/2019 0502   GFRNONAA >60 02/09/2019 0502   GFRAA >60 02/09/2019 0502   Lipase     Component Value Date/Time   LIPASE 24 11/30/2018 0244       Studies/Results: No results found.  Anti-infectives: Anti-infectives (From admission, onward)   Start     Dose/Rate Route Frequency Ordered Stop   02/04/19 1300   vancomycin (VANCOCIN) 1,250 mg in sodium chloride 0.9 % 250 mL IVPB  Status:  Discontinued     1,250 mg 166.7 mL/hr over 90 Minutes Intravenous Every 12 hours 02/04/19 1200 02/06/19 1002   02/04/19 1200  metroNIDAZOLE (FLAGYL) IVPB 500 mg  Status:  Discontinued     500 mg 100 mL/hr over 60 Minutes Intravenous Every 8 hours 02/04/19 1146 02/06/19 1002   02/04/19 0930  ceFEPIme (MAXIPIME) 2 g in sodium chloride 0.9 % 100 mL IVPB     2 g 200 mL/hr over 30 Minutes Intravenous Every 8 hours 02/04/19 0926 02/08/19 2359   02/03/19 1200  meropenem (MERREM) 1 g in sodium chloride 0.9 % 100 mL IVPB  Status:  Discontinued     1 g 200 mL/hr over 30 Minutes Intravenous Every 8 hours 02/03/19 1054 02/04/19 0924   01/19/19 1400  cefTAZidime (FORTAZ) 2 g in sodium chloride 0.9 % 100 mL IVPB     2 g 200 mL/hr over 30 Minutes Intravenous Every 8 hours 01/19/19 1120 01/24/19 2359   01/15/19 1500  cefTAZidime (FORTAZ) 2 g in sodium chloride 0.9 % 100 mL IVPB     2 g 200 mL/hr over 30 Minutes Intravenous Every 8 hours 01/15/19 1448 01/19/19 0542   01/14/19 1400  colistimethate ((  COLYMYCIN-M)) nebulized solution 150 mg  Status:  Discontinued     150 mg Inhalation 3 times daily 01/14/19 1142 01/19/19 1119   01/14/19 1300  meropenem (MERREM) 1 g in sodium chloride 0.9 % 100 mL IVPB  Status:  Discontinued     1 g 200 mL/hr over 30 Minutes Intravenous Every 8 hours 01/14/19 1235 01/15/19 1448   12/23/18 1506  vancomycin (VANCOCIN) 1-5 GM/200ML-% IVPB    Note to Pharmacy:  [facility]   : cabinet override      12/23/18 1506 12/23/18 1515   12/20/18 2200  vancomycin (VANCOCIN) IVPB 1000 mg/200 mL premix  Status:  Discontinued     1,000 mg 200 mL/hr over 60 Minutes Intravenous Every 12 hours 12/20/18 1804 12/22/18 1413   12/18/18 0800  ceFEPIme (MAXIPIME) 1 g in sodium chloride 0.9 % 100 mL IVPB  Status:  Discontinued     1 g 200 mL/hr over 30 Minutes Intravenous Every 8 hours 12/18/18 0713 12/19/18 1201    12/17/18 0530  vancomycin (VANCOCIN) 1,500 mg in sodium chloride 0.9 % 500 mL IVPB  Status:  Discontinued     1,500 mg 250 mL/hr over 120 Minutes Intravenous Every 12 hours 12/16/18 1704 12/20/18 1804   12/16/18 1730  cefTAZidime (FORTAZ) 2 g in sodium chloride 0.9 % 100 mL IVPB  Status:  Discontinued     2 g 200 mL/hr over 30 Minutes Intravenous Every 8 hours 12/16/18 1704 12/18/18 0702   12/16/18 1730  vancomycin (VANCOCIN) 2,000 mg in sodium chloride 0.9 % 500 mL IVPB     2,000 mg 250 mL/hr over 120 Minutes Intravenous  Once 12/16/18 1704 12/16/18 2030   12/07/18 1300  erythromycin 500 mg in sodium chloride 0.9 % 100 mL IVPB  Status:  Discontinued     500 mg 100 mL/hr over 60 Minutes Intravenous Every 6 hours 12/07/18 1118 12/16/18 1627   12/06/18 1500  vancomycin (VANCOCIN) 1,250 mg in sodium chloride 0.9 % 250 mL IVPB  Status:  Discontinued     1,250 mg 166.7 mL/hr over 90 Minutes Intravenous Every 12 hours 12/06/18 1436 12/07/18 1118   12/06/18 0100  vancomycin (VANCOCIN) 1,250 mg in sodium chloride 0.9 % 250 mL IVPB     1,250 mg 166.7 mL/hr over 90 Minutes Intravenous  Once 12/05/18 1054 12/06/18 0146   12/05/18 1200  vancomycin (VANCOCIN) 2,000 mg in sodium chloride 0.9 % 500 mL IVPB     2,000 mg 250 mL/hr over 120 Minutes Intravenous  Once 12/05/18 1054 12/05/18 1350   12/05/18 1200  cefTAZidime (FORTAZ) 1 g in sodium chloride 0.9 % 100 mL IVPB  Status:  Discontinued     1 g 200 mL/hr over 30 Minutes Intravenous Every 8 hours 12/05/18 1106 12/07/18 1118   11/30/18 2200  oseltamivir (TAMIFLU) 6 MG/ML suspension 30 mg     30 mg Per Tube 2 times daily 11/30/18 1348 12/03/18 2159   11/30/18 2200  ceFEPIme (MAXIPIME) 1 g in sodium chloride 0.9 % 100 mL IVPB  Status:  Discontinued     1 g 200 mL/hr over 30 Minutes Intravenous Every 12 hours 11/30/18 1350 12/01/18 1039   11/29/18 2200  ceFEPIme (MAXIPIME) 2 g in sodium chloride 0.9 % 100 mL IVPB  Status:  Discontinued     2 g 200  mL/hr over 30 Minutes Intravenous Every 12 hours 11/29/18 1645 11/30/18 1350   11/29/18 1100  oseltamivir (TAMIFLU) 6 MG/ML suspension 75 mg  Status:  Discontinued  75 mg Per Tube 2 times daily 11/29/18 1000 11/30/18 1348   11/28/18 2300  oseltamivir (TAMIFLU) capsule 75 mg  Status:  Discontinued     75 mg Oral 2 times daily 11/28/18 2238 11/29/18 1000   11/28/18 2030  oseltamivir (TAMIFLU) capsule 75 mg     75 mg Oral  Once 11/28/18 2029 11/28/18 2207   11/28/18 1745  vancomycin (VANCOCIN) 2,000 mg in sodium chloride 0.9 % 500 mL IVPB     2,000 mg 250 mL/hr over 120 Minutes Intravenous  Once 11/28/18 1741 11/28/18 2203   11/28/18 1745  piperacillin-tazobactam (ZOSYN) IVPB 3.375 g     3.375 g 100 mL/hr over 30 Minutes Intravenous  Once 11/28/18 1741 11/28/18 1834       Assessment/Plan HTN CHF DM S/p tx for Influenza A S/p tracheostomy  S/p tx for HCAP Deconditioning  Sacral Wound - MRI negative for osteomyelitis of areas evaluated - hydrotherapy, santyl and WTD dressing changes BID - Turn patient q2hrs  FEN - TF's VTE - Lovenox ID - not currently on any abx  Plan: Wound has such a small opening that it seems difficult for hydrotherapy to clean everything up well. Continues to have slough and some purulent drainage. May benefit from debridement in the OR, will discuss with MD. Continue hydrotherapy and wet to dry/santyl dressing changes.   LOS: 73 days    Franne Forts , [facility] 02/09/2019, 1:33 PM Pager: [phone] Mon-Thurs 7:00 am-4:30 pm Fri 7:00 am -11:30 AM Sat-Sun 7:00 am-11:30 am

## 2019-02-09 NOTE — Progress Notes (Signed)
  Speech Language Pathology Treatment: Dysphagia  Patient Details Name: Antonio [name]. MRN: [id number] DOB: 11-27-1955 Today's Date: 02/09/2019 Time: 1015-1030 SLP Time Calculation (min) (ACUTE ONLY): 15 min  Assessment / Plan / Recommendation Clinical Impression  Skilled treatment session focused on dysphagia. SLP provided skilled observation of pt consuming currently prescribed diet of dysphagia 2 with honey thick liquids. Pt consumed with assist for self-feeding and no s/s of aspiration. Continue per current plan of care with follow-up MBS in 6 to 8 weeks.    HPI HPI: 63 year old male coming in with fever cough chest pain and shortness of breath was found to have influenza A. PMH: diabetes mellitus, hypertension.  Intubated multiple times 1/3 to 1/16 with trach placed 1/16. Repeat MBS on 3/28 for possible initiation of po's. This is pt's 3rd MBS. Plan to repeat to determine upgrade initially; on 3/10 concern pt may have aspirated and made NPO.        SLP Plan  Continue with current plan of care       Recommendations  Diet recommendations: Dysphagia 2 (fine chop);Honey-thick liquid Liquids provided via: Cup Medication Administration: Whole meds with puree Supervision: Patient able to self feed;Intermittent supervision to cue for compensatory strategies Compensations: Slow rate;Small sips/bites;Multiple dry swallows after each bite/sip;Hard cough after swallow;Clear throat intermittently Postural Changes and/or Swallow Maneuvers: Seated upright 90 degrees                Oral Care Recommendations: Oral care BID Follow up Recommendations: Skilled Nursing facility SLP Visit Diagnosis: Dysphagia, pharyngeal phase (R13.13) Plan: Continue with current plan of care       GO                Gusta Marksberry 02/09/2019, 12:51 PM

## 2019-02-09 NOTE — Progress Notes (Signed)
Physical Therapy Wound Treatment Patient Details  Name: Antonio [name]. MRN: [id number] Date of Birth: [birth year]  Today's Date: 02/09/2019 Time: 7322-0254 Time Calculation (min): 41 min  Subjective  Subjective: Pt agreeable Patient and Family Stated Goals: "go home." Date of Onset: (unknown) Prior Treatments: dressing changes  Pain Score: Pain Score: Premedicated   Wound Assessment  Pressure Injury 12/01/18 Unstageable - Full thickness tissue loss in which the base of the ulcer is covered by slough (yellow, tan, gray, green or brown) and/or eschar (tan, brown or black) in the wound bed. sacrum (Active)  Dressing Type Gauze (Comment);Foam;Moist to moist 02/09/2019  1:40 PM  Dressing Clean;Dry;Intact;Changed 02/09/2019  1:40 PM  Dressing Change Frequency Twice a day 02/09/2019  1:40 PM  State of Healing Early/partial granulation 02/09/2019  1:40 PM  Site / Wound Assessment Red;Yellow;Pink 02/09/2019  1:40 PM  % Wound base Red or Granulating 55% 02/09/2019  1:40 PM  % Wound base Yellow/Fibrinous Exudate 40% 02/09/2019  1:40 PM  % Wound base Black/Eschar 5% 02/09/2019  1:40 PM  % Wound base Other/Granulation Tissue (Comment) 0% 02/09/2019  1:40 PM  Peri-wound Assessment Erythema (blanchable);Pink 02/09/2019  1:40 PM  Wound Length (cm) 9 cm 02/04/2019 12:12 PM  Wound Width (cm) 5 cm 02/04/2019 12:12 PM  Wound Depth (cm) 2 cm 02/04/2019 12:12 PM  Wound Surface Area (cm^2) 45 cm^2 02/04/2019 12:12 PM  Wound Volume (cm^3) 90 cm^3 02/04/2019 12:12 PM  Tunneling (cm) new tunneling noted at 7 oclock did not formally measure.  purulent drainage coming from tract with odor 02/07/2019  5:26 PM  Margins Unattached edges (unapproximated) 02/09/2019  1:40 PM  Drainage Amount Copious 02/09/2019  1:40 PM  Drainage Description Purulent;Green;Odor 02/09/2019  1:40 PM  Treatment Debridement (Selective);Hydrotherapy (Pulse lavage);Packing (Saline gauze) 02/09/2019  1:40 PM   Santyl applied to wound bed prior to  applying dressing.    Hydrotherapy Pulsed lavage therapy - wound location: sacrum Pulsed Lavage with Suction (psi): 12 psi Pulsed Lavage with Suction - Normal Saline Used: 1000 mL Pulsed Lavage Tip: Tip with splash shield Selective Debridement Selective Debridement - Location: sacrum Selective Debridement - Tools Used: Forceps;Scissors Selective Debridement - Tissue Removed: yellow necrotic tissue   Wound Assessment and Plan  Wound Therapy - Assess/Plan/Recommendations Wound Therapy - Clinical Statement: Wound depth continues to incr as necrotic tissue removed. Copious purulent green drainage present. Wound Therapy - Functional Problem List: decr mobility Factors Delaying/Impairing Wound Healing: Diabetes Mellitus;Immobility;Multiple medical problems Hydrotherapy Plan: Debridement;Dressing change;Patient/family education;Pulsatile lavage with suction Wound Therapy - Frequency: 3X / week Wound Therapy - Follow Up Recommendations: Skilled nursing facility Wound Plan: see above  Wound Therapy Goals- Improve the function of patient's integumentary system by progressing the wound(s) through the phases of wound healing (inflammation - proliferation - remodeling) by: Decrease Necrotic Tissue to: 35 Decrease Necrotic Tissue - Progress: Progressing toward goal Increase Granulation Tissue to: 65 Increase Granulation Tissue - Progress: Progressing toward goal  Goals will be updated until maximal potential achieved or discharge criteria met.  Discharge criteria: when goals achieved, discharge from hospital, MD decision/surgical intervention, no progress towards goals, refusal/missing three consecutive treatments without notification or medical reason.  [place] 02/09/2019, 1:44 PM [place] Pager [phone] Office [phone]

## 2019-02-10 LAB — GLUCOSE, CAPILLARY
GLUCOSE-CAPILLARY: 170 mg/dL — AB (ref 70–99)
GLUCOSE-CAPILLARY: 89 mg/dL (ref 70–99)
Glucose-Capillary: 116 mg/dL — ABNORMAL HIGH (ref 70–99)
Glucose-Capillary: 208 mg/dL — ABNORMAL HIGH (ref 70–99)

## 2019-02-10 NOTE — Progress Notes (Signed)
Patient ID: Antonio Richardsaniel Watson Hottinger Jr., male   DOB: January 09, 1956, 63 y.o.   MRN: [id number]  PROGRESS NOTE    Antonio Richardsaniel Watson Wilton Jr.  WGN:[id number] DOB: January 09, 1956 DOA: 11/28/2018 PCP: [name], [name], NP   Brief Narrative:  63 year old male was admitted on 11/28/2018 with influenza A causing respiratory failure and cardiogenic shock.  Patient has had prolonged hospitalization with mechanical ventilation and subsequent tracheostomy placed on December 11, 2018.  He was treated with broad-spectrum antibiotics for his Pseudomonas from his sputum culture.  He was switched to cuffless trach on 01/27/2019.  He also had PEG tube placement.  Significant events: 1/6 self extubated  1/7 re-intubated  1/8 self extubated 1/9 acute respiratory failure, mucus plugging right lower lobe, re-intubated, bronchoscopy 1/10 New fever T Max 103.1, antibiotics resumed with ceftaz and vancomycin 1/11 Hemodynamically seems to be improving. Very hypernatremic. X-ray improving. Adding free water, one-time Lasix, follow-up chemistry a.m. Hoping to initiate weaning efforts on 1/12 1/16 Continuing diuresis and ongoing ventilatory support. Working on weaning but still has significant work of breathing. Plan has been to proceed with tracheostomy. Hopefully we can continue ongoing diuresis and continue to decrease sedation 1/20 still struggling to wean. Secretions and anxiety are barriers.  1/21 trach revision to 6 distal XLT, fever, increased secretions, abx broadened / bronch 1/22 ongoing thick secretions 1/24 Failed SBT, low grade fever, Per CXR persistent pleural effusions / basilar consolidation atelectasis vs pneumonia per bases 1/25 Mucus plugged with slow recovery after chest PT and Wound Care 2/3  first time weaned for greater than 15 minutes (previously failed due to agitation, secretions, tachypnea) 2/10 TRH assumed care 2/11 trach changed to a #5 cuffless per PCCM  2/13 trach changed to #4 shiley cuffless  2/13  afternoon - pulled out new trach and "disoldged" PEG - both reinserted/repositioned  2/13 PM > 2/14 AM acute hypoxic failure - emergent bedside bronch > trach obstructed - #6 cuffed trach placed > back to ICU on vent  2/14 trach changed to #5 cuffed 2/15 TRH resumed care - resp distress > back on vent  2/15 Failed ATC with increased WOB 2/16 Failed ATC with increased WOB  2/21 back on pressure support 2/24 back on TC  01/27/19 - 5  XLT cuffless trach  01/29/2019: Size 4 cuff less tracheostomy placed 01/30/2019: Capping trials initiated 02/02/2019: Tracheostomy decannulated  Assessment & Plan:   Active Problems:   Chest pain   SIRS (systemic inflammatory response syndrome) (HCC)   Acute respiratory failure with hypoxemia (HCC)   Pressure injury of skin   Influenza A virus present   Pneumonia   Tracheostomy status (HCC)   Acute pulmonary edema (HCC)   Hypoxia   Fever, unspecified   Tachycardia   Acute on chronic respiratory failure (HCC)   Hypoxemia   Phlegm in throat   Dysphagia   Status post tracheostomy (HCC)   HCAP (healthcare-associated pneumonia)   Diabetes mellitus type 2 in nonobese (HCC)   Essential hypertension   S/P BKA (below knee amputation) unilateral, left (HCC)   Tachypnea   Pain   Agitation   Acute blood loss anemia   Acute respiratory failure (HCC)   Airway trauma  Fever -No temperature spike over the last few days.  Cultures negative so far.  Chest x-ray was negative for infiltrates.  -Has completed 5-day course of cefepime.  Antibiotics discontinued on 02/08/2019.  Monitor off antibiotics.  Leukocytosis -Resolved.  Hypokalemia-replace.  Will repeat a.m. labs.  Acute hypoxic respiratory failure  secondary to influenza A -Patient was under the care of ICU, required multiple intubations and subsequently tracheostomy -Sputum culture from trach had grown Corynebacterium stratum and Pseudomonas recently.  He was treated with ceftazidime for the same. -He has  been switched to cuffless trach on 01/27/2019 by pulmonary.  He then did well with capping trials and subsequently tracheostomy was decannulated on 02/02/2019.  As per pulmonary, if tracheostomy area does not close off in 2 weeks, patient will need ENT eval for tracheocutaneous fistula.  For now, a regular Band-Aid can be used until stoma is completely closed.   Pulmonary has signed off. -Currently on 2 L oxygen via nasal cannula.  Sacral/coccygeal stage IV pressure injury -Wound care evaluation appreciated.   -General surgery evaluation has been appreciated: No need for any surgical debridement at this time.  Continue wound care and hydrotherapy.  MRI of the spine was negative for any evidence of osteomyelitis in the lumbosacral spine.    Acute systolic heart failure -EF was 20 to 25% on echo on 11/29/2018 but improved to 60 to 65% on 12/24/2018. -Most likely secondary to sepsis -No plans for cardiac cath per cardiology -Continue beta-blocker.  Continue strict input and output. -No signs of volume overload at this time.  Dysphagia -Status post PEG tube placement.  Continue PEG tube feeding. -SLP following the patient and is recommending dysphagia 2 diet.  Patient will need repeat MBS as an outpatient not before 6 to 8 weeks.  Acute metabolic encephalopathy -Resolved  Deconditioning - will need SNF placement.  Social worker following  Diabetes mellitus type 2 -Monitor CBGs.  Blood sugars on the lower side.  Decrease Levemir to 20 units twice daily.  Continue sliding scale coverage.  DVT prophylaxis: Lovenox Code Status: Full code Family Communication: None Disposition Plan: SNF once bed is available.  Consultants: PCCM/cardiology/general surgery  Procedures:  As described above  Antimicrobials:  Anti-infectives (From admission, onward)   Start     Dose/Rate Route Frequency Ordered Stop   02/04/19 1300  vancomycin (VANCOCIN) 1,250 mg in sodium chloride 0.9 % 250 mL IVPB  Status:   Discontinued     1,250 mg 166.7 mL/hr over 90 Minutes Intravenous Every 12 hours 02/04/19 1200 02/06/19 1002   02/04/19 1200  metroNIDAZOLE (FLAGYL) IVPB 500 mg  Status:  Discontinued     500 mg 100 mL/hr over 60 Minutes Intravenous Every 8 hours 02/04/19 1146 02/06/19 1002   02/04/19 0930  ceFEPIme (MAXIPIME) 2 g in sodium chloride 0.9 % 100 mL IVPB     2 g 200 mL/hr over 30 Minutes Intravenous Every 8 hours 02/04/19 0926 02/08/19 2359   02/03/19 1200  meropenem (MERREM) 1 g in sodium chloride 0.9 % 100 mL IVPB  Status:  Discontinued     1 g 200 mL/hr over 30 Minutes Intravenous Every 8 hours 02/03/19 1054 02/04/19 0924   01/19/19 1400  cefTAZidime (FORTAZ) 2 g in sodium chloride 0.9 % 100 mL IVPB     2 g 200 mL/hr over 30 Minutes Intravenous Every 8 hours 01/19/19 1120 01/24/19 2359   01/15/19 1500  cefTAZidime (FORTAZ) 2 g in sodium chloride 0.9 % 100 mL IVPB     2 g 200 mL/hr over 30 Minutes Intravenous Every 8 hours 01/15/19 1448 01/19/19 0542   01/14/19 1400  colistimethate ((COLYMYCIN-M)) nebulized solution 150 mg  Status:  Discontinued     150 mg Inhalation 3 times daily 01/14/19 1142 01/19/19 1119   01/14/19 1300  meropenem (MERREM)  1 g in sodium chloride 0.9 % 100 mL IVPB  Status:  Discontinued     1 g 200 mL/hr over 30 Minutes Intravenous Every 8 hours 01/14/19 1235 01/15/19 1448   12/23/18 1506  vancomycin (VANCOCIN) 1-5 GM/200ML-% IVPB    Note to Pharmacy:  [facility]   : cabinet override      12/23/18 1506 12/23/18 1515   12/20/18 2200  vancomycin (VANCOCIN) IVPB 1000 mg/200 mL premix  Status:  Discontinued     1,000 mg 200 mL/hr over 60 Minutes Intravenous Every 12 hours 12/20/18 1804 12/22/18 1413   12/18/18 0800  ceFEPIme (MAXIPIME) 1 g in sodium chloride 0.9 % 100 mL IVPB  Status:  Discontinued     1 g 200 mL/hr over 30 Minutes Intravenous Every 8 hours 12/18/18 0713 12/19/18 1201   12/17/18 0530  vancomycin (VANCOCIN) 1,500 mg in sodium chloride 0.9 % 500 mL  IVPB  Status:  Discontinued     1,500 mg 250 mL/hr over 120 Minutes Intravenous Every 12 hours 12/16/18 1704 12/20/18 1804   12/16/18 1730  cefTAZidime (FORTAZ) 2 g in sodium chloride 0.9 % 100 mL IVPB  Status:  Discontinued     2 g 200 mL/hr over 30 Minutes Intravenous Every 8 hours 12/16/18 1704 12/18/18 0702   12/16/18 1730  vancomycin (VANCOCIN) 2,000 mg in sodium chloride 0.9 % 500 mL IVPB     2,000 mg 250 mL/hr over 120 Minutes Intravenous  Once 12/16/18 1704 12/16/18 2030   12/07/18 1300  erythromycin 500 mg in sodium chloride 0.9 % 100 mL IVPB  Status:  Discontinued     500 mg 100 mL/hr over 60 Minutes Intravenous Every 6 hours 12/07/18 1118 12/16/18 1627   12/06/18 1500  vancomycin (VANCOCIN) 1,250 mg in sodium chloride 0.9 % 250 mL IVPB  Status:  Discontinued     1,250 mg 166.7 mL/hr over 90 Minutes Intravenous Every 12 hours 12/06/18 1436 12/07/18 1118   12/06/18 0100  vancomycin (VANCOCIN) 1,250 mg in sodium chloride 0.9 % 250 mL IVPB     1,250 mg 166.7 mL/hr over 90 Minutes Intravenous  Once 12/05/18 1054 12/06/18 0146   12/05/18 1200  vancomycin (VANCOCIN) 2,000 mg in sodium chloride 0.9 % 500 mL IVPB     2,000 mg 250 mL/hr over 120 Minutes Intravenous  Once 12/05/18 1054 12/05/18 1350   12/05/18 1200  cefTAZidime (FORTAZ) 1 g in sodium chloride 0.9 % 100 mL IVPB  Status:  Discontinued     1 g 200 mL/hr over 30 Minutes Intravenous Every 8 hours 12/05/18 1106 12/07/18 1118   11/30/18 2200  oseltamivir (TAMIFLU) 6 MG/ML suspension 30 mg     30 mg Per Tube 2 times daily 11/30/18 1348 12/03/18 2159   11/30/18 2200  ceFEPIme (MAXIPIME) 1 g in sodium chloride 0.9 % 100 mL IVPB  Status:  Discontinued     1 g 200 mL/hr over 30 Minutes Intravenous Every 12 hours 11/30/18 1350 12/01/18 1039   11/29/18 2200  ceFEPIme (MAXIPIME) 2 g in sodium chloride 0.9 % 100 mL IVPB  Status:  Discontinued     2 g 200 mL/hr over 30 Minutes Intravenous Every 12 hours 11/29/18 1645 11/30/18 1350    11/29/18 1100  oseltamivir (TAMIFLU) 6 MG/ML suspension 75 mg  Status:  Discontinued     75 mg Per Tube 2 times daily 11/29/18 1000 11/30/18 1348   11/28/18 2300  oseltamivir (TAMIFLU) capsule 75 mg  Status:  Discontinued  75 mg Oral 2 times daily 11/28/18 2238 11/29/18 1000   11/28/18 2030  oseltamivir (TAMIFLU) capsule 75 mg     75 mg Oral  Once 11/28/18 2029 11/28/18 2207   11/28/18 1745  vancomycin (VANCOCIN) 2,000 mg in sodium chloride 0.9 % 500 mL IVPB     2,000 mg 250 mL/hr over 120 Minutes Intravenous  Once 11/28/18 1741 11/28/18 2203   11/28/18 1745  piperacillin-tazobactam (ZOSYN) IVPB 3.375 g     3.375 g 100 mL/hr over 30 Minutes Intravenous  Once 11/28/18 1741 11/28/18 1834        Subjective: Patient seen and examined at bedside.  Patient denies any overnight fever, nausea, vomiting, worsening diarrhea.   Objective: Vitals:   02/09/19 2206 02/10/19 0511 02/10/19 0804 02/10/19 0806  BP: (!) 160/75 (!) 158/71    Pulse: 70 74    Resp:      Temp: 98.1 F (36.7 C) 97.6 F (36.4 C)    TempSrc: Oral Oral    SpO2: 97% 99% 97% 98%  Weight:      Height:        Intake/Output Summary (Last 24 hours) at 02/10/2019 0949 Last data filed at 02/10/2019 0600 Gross per 24 hour  Intake 495 ml  Output 1300 ml  Net -805 ml   Filed Weights   02/02/19 0257 02/03/19 0554 02/04/19 0547  Weight: 89.6 kg 120.2 kg 81.2 kg    Examination:  General exam: Appears calm and comfortable.  Discharge.  Looks older than stated age.  Still has hoarse voice. Respiratory system: Bilateral decreased breath sounds at bases with scattered crackles.   Cardiovascular system:  S1-S2 heard, rate controlled Gastrointestinal system: Abdomen is nondistended, soft and nontender. Normal bowel sounds heard. Extremities: No edema.  Left BKA; right first toe ray amputation.     Data Reviewed: I have personally reviewed following labs and imaging studies  CBC: Recent Labs  Lab 02/04/19 1201  02/05/19 0323 02/06/19 0413 02/07/19 0549 02/09/19 0502  WBC 12.3* 9.9 9.0 7.4 4.8  NEUTROABS 9.7* 7.8* 6.6 5.2 3.2  HGB 11.8* 10.7* 9.2* 9.4* 9.0*  HCT 37.2* 33.9* 29.8* 31.2* 31.6*  MCV 86.5 85.8 85.4 86.7 91.3  PLT 171 167 170 176 224   Basic Metabolic Panel: Recent Labs  Lab 02/04/19 1201 02/05/19 0323 02/06/19 0413 02/07/19 0549 02/09/19 0502  NA 132* 133* 133* 134* 141  K 3.8 4.0 3.9 3.9 3.1*  CL 95* 96* 98 98 100  CO2 GLUCOSE 189* 191* 175* 166* 84  BUN 31* CREATININE 0.67 0.58* 0.47* 0.42* 1.11  CALCIUM 8.6* 8.4* 8.1* 8.6* 9.6  MG  --  1.8 1.8 1.8 2.4   GFR: Estimated Creatinine Clearance: 76.7 mL/min (by C-G formula based on SCr of 1.11 mg/dL). Liver Function Tests: Recent Labs  Lab 02/04/19 1201 02/09/19 0502  AST 11* 19  ALT 14 8  ALKPHOS 77 108  BILITOT 0.6 1.7*  PROT 6.6 5.6*  ALBUMIN 2.5* 3.0*   No results for input(s): LIPASE, AMYLASE in the last 168 hours. No results for input(s): AMMONIA in the last 168 hours. Coagulation Profile: No results for input(s): INR, PROTIME in the last 168 hours. Cardiac Enzymes: No results for input(s): CKTOTAL, CKMB, CKMBINDEX, TROPONINI in the last 168 hours. BNP (last 3 results) No results for input(s): PROBNP in the last 8760 hours. HbA1C: No results for input(s): HGBA1C in the last 72 hours. CBG: Recent Labs  Lab 02/09/19 0746 02/09/19 1222 02/09/19 1643 02/09/19 2206 02/10/19 0847  GLUCAP 95 138* 105* 180* 170*   Lipid Profile: No results for input(s): CHOL, HDL, LDLCALC, TRIG, CHOLHDL, LDLDIRECT in the last 72 hours. Thyroid Function Tests: No results for input(s): TSH, T4TOTAL, FREET4, T3FREE, THYROIDAB in the last 72 hours. Anemia Panel: No results for input(s): VITAMINB12, FOLATE, FERRITIN, TIBC, IRON, RETICCTPCT in the last 72 hours. Sepsis Labs: Recent Labs  Lab 02/04/19 1201  PROCALCITON 0.26    Recent Results (from the past 240 hour(s))  Expectorated  sputum assessment w rflx to resp cult     Status: None   Collection Time: 02/04/19 10:44 AM  Result Value Ref Range Status   Specimen Description SPUTUM  Final   Special Requests NONE  Final   Sputum evaluation   Final    THIS SPECIMEN IS ACCEPTABLE FOR SPUTUM CULTURE Performed at [facility], [address] [place], [place] [address]    Report Status 02/04/2019 FINAL  Final  Culture, respiratory     Status: None   Collection Time: 02/04/19 10:44 AM  Result Value Ref Range Status   Specimen Description SPUTUM  Final   Special Requests NONE Reflexed from [id number]  Final   Gram Stain   Final    ABUNDANT WBC PRESENT, PREDOMINANTLY PMN RARE SQUAMOUS EPITHELIAL CELLS PRESENT RARE GRAM POSITIVE RODS Performed at [facility], [address], [place], [place] [address]    Culture FEW PSEUDOMONAS AERUGINOSA  Final   Report Status 02/07/2019 FINAL  Final   Organism ID, Bacteria PSEUDOMONAS AERUGINOSA  Final      Susceptibility   Pseudomonas aeruginosa - MIC*    CEFTAZIDIME 4 SENSITIVE Sensitive     CIPROFLOXACIN <=0.25 SENSITIVE Sensitive     GENTAMICIN <=1 SENSITIVE Sensitive     IMIPENEM 2 SENSITIVE Sensitive     PIP/TAZO 16 SENSITIVE Sensitive     CEFEPIME 4 SENSITIVE Sensitive     * FEW PSEUDOMONAS AERUGINOSA  Culture, blood (routine x 2)     Status: None   Collection Time: 02/04/19 11:52 AM  Result Value Ref Range Status   Specimen Description BLOOD LEFT ARM  Final   Special Requests   Final    BOTTLES DRAWN AEROBIC ONLY Blood Culture adequate volume   Culture   Final    NO GROWTH 5 DAYS Performed at [facility], [address] [place], [place] [address]    Report Status 02/09/2019 FINAL  Final  Culture, blood (routine x 2)     Status: None   Collection Time: 02/04/19 12:00 PM  Result Value Ref Range Status   Specimen Description BLOOD LEFT ARM  Final   Special Requests   Final    BOTTLES DRAWN AEROBIC ONLY Blood Culture adequate volume   Culture    Final    NO GROWTH 5 DAYS Performed at [facility], [address], [place], [place] [address]    Report Status 02/09/2019 FINAL  Final  Culture, Urine     Status: None   Collection Time: 02/04/19  1:54 PM  Result Value Ref Range Status   Specimen Description URINE, CLEAN CATCH  Final   Special Requests NONE  Final   Culture   Final    NO GROWTH Performed at [facility], [address], [place], [place] [address]    Report Status 02/05/2019 FINAL  Final         Radiology Studies:  No results found.      Scheduled Meds: . arformoterol  15 mcg Nebulization BID  . aspirin  81 mg Per Tube Daily  . atorvastatin  40 mg Per Tube q1800  . budesonide (PULMICORT) nebulizer solution  0.5 mg Nebulization BID  . carvedilol  3.125 mg Per Tube BID WC  . collagenase   Topical Daily  . enoxaparin (LOVENOX) injection  40 mg Subcutaneous Q24H  . famotidine  20 mg Per Tube BID  . folic acid  1 mg Per Tube Daily  . guaiFENesin  5 mL Per Tube Q12H  . insulin aspart  0-20 Units Subcutaneous TID WC  . insulin detemir  25 Units Subcutaneous BID  . multivitamin  15 mL Per Tube Daily  . nutrition supplement (JUVEN)  1 packet Per Tube BID BM  . tapentadol  50 mg Oral QID  . thiamine  100 mg Per Tube Daily   Continuous Infusions: . sodium chloride 10 mL/hr at 01/24/19 2000  . feeding supplement (OSMOLITE 1.2 CAL) 45 mL/hr at 02/10/19 0600     LOS: 74 days        Glade Lloyd, MD [facility] 02/10/2019, 9:49 AM

## 2019-02-10 NOTE — Progress Notes (Signed)
  Speech Language Pathology Treatment: Dysphagia  Patient Details Name: Antonio [name]. MRN: [id number] DOB: [birth year] Today's Date: 02/10/2019 Time: 0034-9179 SLP Time Calculation (min) (ACUTE ONLY): 8 min  Assessment / Plan / Recommendation Clinical Impression  MBS repeated last week and pt able to initiate Dys 2, honey thick liquids. 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 Seen 3/16 by a different  SLP with Dys 2 texture, honey thick and consumed without apparent difficulty and no mention of dislike or refusal by pt. Today informed by rehab tech (assists with wound care) she witnessed pt drinking thin liquids, eating food from [employer] and pt's mom stated "he won't drink it thick". Discussed with pt reports he has not been drinking thick liquids and has consumed thin stating "I don't like it." SLP reviewed results of MBS, recommendations for thickener with clinical reasoning and risks. Informed pt he has the right to refused but strongly encouraged him to comply, his swallow has been improving and likely be safe to return to thin liquids in near future. Pt pleasant but continued to decline modified diet. To avoid non necessary waste of Dys 2, honey thick, SLP will order regular, thin. Speech Pathology has completed education, notify MD and sign off at this time.     HPI HPI: 63 year old male coming in with fever cough chest pain and shortness of breath was found to have influenza A. PMH: diabetes mellitus, hypertension.  Intubated multiple times 1/3 to 1/16 with trach placed 1/16. Repeat MBS on 3/28 for possible initiation of po's. This is pt's 3rd MBS. Plan to repeat to determine upgrade initially; on 3/10 concern pt may have aspirated and made NPO.        SLP Plan  Discharge SLP treatment due to (comment)(non compliance)       Recommendations  Diet recommendations: Other(comment)(pt choosing regular/thin)                SLP Visit Diagnosis: Dysphagia, unspecified (R13.10) Plan: Discharge SLP treatment due to (comment)(non compliance)                       Royce Macadamia 02/10/2019, 3:16 PM   Breck Coons Lonell Face.Ed Nurse, children's [phone] Office [phone]

## 2019-02-10 NOTE — Progress Notes (Signed)
Physical Therapy Wound Treatment Patient Details  Name: Antonio [name]. MRN: [id number] Date of Birth: [birth year]  Today's Date: 02/10/2019 Time: 1030-1110 Time Calculation (min): 40 min  Subjective  Subjective: "Is it getting deeper?" Patient and Family Stated Goals: "go home." Date of Onset: (unknown) Prior Treatments: dressing changes  Pain Score: Pain with pulsatile lavage and debridement; Pre medicated  Wound Assessment  Pressure Injury 12/01/18 Unstageable - Full thickness tissue loss in which the base of the ulcer is covered by slough (yellow, tan, gray, green or brown) and/or eschar (tan, brown or black) in the wound bed. sacrum (Active)  Wound Image   02/10/2019 11:27 AM  Dressing Type Gauze (Comment);Moist to moist;ABD 02/10/2019 11:27 AM  Dressing Clean;Dry;Intact;Changed 02/10/2019 11:27 AM  Dressing Change Frequency Twice a day 02/10/2019 11:27 AM  State of Healing Early/partial granulation 02/10/2019 11:27 AM  Site / Wound Assessment Red;Yellow;Pink 02/10/2019 11:27 AM  % Wound base Red or Granulating 55% 02/10/2019 11:27 AM  % Wound base Yellow/Fibrinous Exudate 40% 02/10/2019 11:27 AM  % Wound base Black/Eschar 5% 02/10/2019 11:27 AM  % Wound base Other/Granulation Tissue (Comment) 0% 02/10/2019 11:27 AM  Peri-wound Assessment Erythema (blanchable);Pink 02/10/2019 11:27 AM  Wound Length (cm) 7 cm 02/10/2019 11:27 AM  Wound Width (cm) 5 cm 02/10/2019 11:27 AM  Wound Depth (cm) 4 cm 02/10/2019 11:27 AM  Wound Surface Area (cm^2) 35 cm^2 02/10/2019 11:27 AM  Wound Volume (cm^3) 140 cm^3 02/10/2019 11:27 AM  Tunneling (cm) 4.5 02/10/2019 11:27 AM  Margins Unattached edges (unapproximated) 02/10/2019 11:27 AM  Drainage Amount Copious 02/10/2019 11:27 AM  Drainage Description Purulent;Green;Odor 02/10/2019 11:27 AM  Treatment Debridement (Selective);Hydrotherapy (Pulse lavage);Packing (Saline gauze);Tape changed 02/10/2019 11:27 AM   Santyl applied to wound bed prior to applying  dressing.    Hydrotherapy Pulsed lavage therapy - wound location: sacrum Pulsed Lavage with Suction (psi): 12 psi Pulsed Lavage with Suction - Normal Saline Used: 1000 mL Pulsed Lavage Tip: Tip with splash shield Selective Debridement Selective Debridement - Location: sacrum Selective Debridement - Tools Used: Forceps;Scissors Selective Debridement - Tissue Removed: yellow necrotic tissue   Wound Assessment and Plan  Wound Therapy - Assess/Plan/Recommendations Wound Therapy - Clinical Statement: Wound depth continues to increase at superior opening with removal of yellow fibrinous slough. Tunneling also present at 7 o clock.  Wound Therapy - Functional Problem List: decr mobility Factors Delaying/Impairing Wound Healing: Diabetes Mellitus;Immobility;Multiple medical problems Hydrotherapy Plan: Debridement;Dressing change;Patient/family education;Pulsatile lavage with suction Wound Therapy - Frequency: 6X / week Wound Therapy - Follow Up Recommendations: Skilled nursing facility Wound Plan: see above  Wound Therapy Goals- Improve the function of patient's integumentary system by progressing the wound(s) through the phases of wound healing (inflammation - proliferation - remodeling) by: Decrease Necrotic Tissue to: 35 Decrease Necrotic Tissue - Progress: Progressing toward goal Increase Granulation Tissue to: 65 Increase Granulation Tissue - Progress: Progressing toward goal  Goals will be updated until maximal potential achieved or discharge criteria met.  Discharge criteria: when goals achieved, discharge from hospital, MD decision/surgical intervention, no progress towards goals, refusal/missing three consecutive treatments without notification or medical reason.  GP    [name], PT, DPT Acute Rehabilitation Services Pager [phone] Office [phone]   [name] 02/10/2019, 11:31 AM

## 2019-02-11 LAB — BASIC METABOLIC PANEL
ANION GAP: 5 (ref 5–15)
BUN: 16 mg/dL (ref 8–23)
CO2: 34 mmol/L — ABNORMAL HIGH (ref 22–32)
Calcium: 8.8 mg/dL — ABNORMAL LOW (ref 8.9–10.3)
Chloride: 97 mmol/L — ABNORMAL LOW (ref 98–111)
Creatinine, Ser: 0.53 mg/dL — ABNORMAL LOW (ref 0.61–1.24)
Glucose, Bld: 180 mg/dL — ABNORMAL HIGH (ref 70–99)
Potassium: 4.5 mmol/L (ref 3.5–5.1)
Sodium: 136 mmol/L (ref 135–145)

## 2019-02-11 LAB — CBC WITH DIFFERENTIAL/PLATELET
ABS IMMATURE GRANULOCYTES: 0.07 10*3/uL (ref 0.00–0.07)
Basophils Absolute: 0 10*3/uL (ref 0.0–0.1)
Basophils Relative: 0 %
Eosinophils Absolute: 0.3 10*3/uL (ref 0.0–0.5)
Eosinophils Relative: 5 %
HCT: 36 % — ABNORMAL LOW (ref 39.0–52.0)
Hemoglobin: 10.9 g/dL — ABNORMAL LOW (ref 13.0–17.0)
IMMATURE GRANULOCYTES: 1 %
Lymphocytes Relative: 31 %
Lymphs Abs: 2.2 10*3/uL (ref 0.7–4.0)
MCH: 26 pg (ref 26.0–34.0)
MCHC: 30.3 g/dL (ref 30.0–36.0)
MCV: 85.7 fL (ref 80.0–100.0)
Monocytes Absolute: 0.6 10*3/uL (ref 0.1–1.0)
Monocytes Relative: 9 %
NEUTROS PCT: 54 %
Neutro Abs: 3.9 10*3/uL (ref 1.7–7.7)
Platelets: 271 10*3/uL (ref 150–400)
RBC: 4.2 MIL/uL — ABNORMAL LOW (ref 4.22–5.81)
RDW: 15.5 % (ref 11.5–15.5)
WBC: 7.1 10*3/uL (ref 4.0–10.5)
nRBC: 0 % (ref 0.0–0.2)

## 2019-02-11 LAB — GLUCOSE, CAPILLARY
GLUCOSE-CAPILLARY: 146 mg/dL — AB (ref 70–99)
Glucose-Capillary: 160 mg/dL — ABNORMAL HIGH (ref 70–99)
Glucose-Capillary: 149 mg/dL — ABNORMAL HIGH (ref 70–99)
Glucose-Capillary: 154 mg/dL — ABNORMAL HIGH (ref 70–99)

## 2019-02-11 LAB — MAGNESIUM: Magnesium: 1.7 mg/dL (ref 1.7–2.4)

## 2019-02-11 MED ORDER — POLYETHYLENE GLYCOL 3350 17 G PO PACK
17.0000 g | PACK | Freq: Every day | ORAL | Status: DC
  Administered 2019-02-13 – 2019-02-15 (×3): 17 g
  Filled 2019-02-11 (×8): qty 1

## 2019-02-11 MED ORDER — DAKINS (1/4 STRENGTH) 0.125 % EX SOLN
Freq: Every day | CUTANEOUS | Status: AC
  Administered 2019-02-12 – 2019-02-14 (×3)
  Filled 2019-02-11: qty 473

## 2019-02-11 MED ORDER — SENNA 8.6 MG PO TABS
2.0000 | ORAL_TABLET | Freq: Every evening | ORAL | Status: DC | PRN
  Administered 2019-02-17 (×2): 17.2 mg via ORAL
  Filled 2019-02-11 (×2): qty 2

## 2019-02-11 MED ORDER — COLLAGENASE 250 UNIT/GM EX OINT
TOPICAL_OINTMENT | Freq: Every day | CUTANEOUS | Status: DC
  Administered 2019-02-15 – 2019-02-20 (×6): via TOPICAL
  Filled 2019-02-11 (×4): qty 30

## 2019-02-11 NOTE — Progress Notes (Signed)
PROGRESS NOTE        PATIENT DETAILS Name: Antonio [name]. Age: 63 y.o. Sex: male Date of Birth: 16-Dec-1955 Admit Date: 11/28/2018 Admitting Physician Reyes Ivan, MD GYK:ZLDJ, Orie Rout, NP  Brief Narrative: Patient is a 63 y.o. male admitted on 1/3 with influenza A causing acute hypoxic respiratory failure, cardiogenic shock-required prolonged mechanical ventilation-is currently s/p tracheostomy and PEG tube placement.  Significant events: 1/03  Admit, Influenza A positive 1/06  Self extubated  1/07  Re-intubated  1/08  Self-extubated  1/16 tracheostomy 1/21  Trach revision to XLT distal 1/28 PEG tube 2/03  Weaned 15 minutes  2/11  ATC  2/14  Trach changed to #4, mucus plugging >back to #5 XLT 2/15  Failed ATC with increased WOB 2/16  Failed ATC with increased WOB  2/21 back on pressure support 2/24 back on TC  01/27/19 - 5 XLT cuffless trach  01/29/2019: Size 4 cuff less tracheostomy placed 01/30/2019: Capping trials initiated 02/02/2019: Tracheostomy decannulated  Subjective: Lying comfortably in bed-denies any chest pain or shortness of breath.  Assessment/Plan: Fever: Afebrile over the past few days-chest x-ray without any infiltrate, blood culture and urine culture on 3/11 negative.  Currently off all antimicrobial therapy-continue to monitor.  Stage IV sacral/coccygeal ulceration: Continue hydrotherapy per wound care.  General surgery following-no recommendations for surgical debridement.  Acute hypoxic respiratory failure secondary to influenza A, pseudomonal/Corynebacterium pneumonia: Prolonged hospitalization requiring prolonged ventilator support-subsequently tracheostomy on 1/16.  Currently decannulated on 3/9-Per last PCCM note-if the wound does not close in 2 weeks-may need evaluation for tracheocutaneous fistula.  Acute systolic heart failure (EF 20-25% on 1/4, EF 60-65% by TTE on 1/29): Compensated-continue beta-blocker.  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  Cardiology recommending further work-up to be done in the outpatient setting.  Dysphagia: Speech therapy following-continue dysphagia 2 diet.  Metabolic encephalopathy: Resolved.  Etiology felt to be secondary to hypoxia/pneumonia.  DM-2: CBG stable continue Levemir 25 units twice daily and SSI.  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  DVT Prophylaxis: Prophylactic Lovenox   Code Status: Full code  Family Communication: None at bedside  Disposition Plan: Remain inpatient  Antimicrobial agents: Anti-infectives (From admission, onward)   Start     Dose/Rate Route Frequency Ordered Stop   02/04/19 1300  vancomycin (VANCOCIN) 1,250 mg in sodium chloride 0.9 % 250 mL IVPB  Status:  Discontinued     1,250 mg 166.7 mL/hr over 90 Minutes Intravenous Every 12 hours 02/04/19 1200 02/06/19 1002   02/04/19 1200  metroNIDAZOLE (FLAGYL) IVPB 500 mg  Status:  Discontinued     500 mg 100 mL/hr over 60 Minutes Intravenous Every 8 hours 02/04/19 1146 02/06/19 1002   02/04/19 0930  ceFEPIme (MAXIPIME) 2 g in sodium chloride 0.9 % 100 mL IVPB     2 g 200 mL/hr over 30 Minutes Intravenous Every 8 hours 02/04/19 0926 02/08/19 2359   02/03/19 1200  meropenem (MERREM) 1 g in sodium chloride 0.9 % 100 mL IVPB  Status:  Discontinued     1 g 200 mL/hr over 30 Minutes Intravenous Every 8 hours 02/03/19 1054 02/04/19 0924   01/19/19 1400  cefTAZidime (FORTAZ) 2 g in sodium chloride 0.9 %  100 mL IVPB     2 g 200 mL/hr over 30 Minutes Intravenous Every 8 hours 01/19/19 1120 01/24/19 2359   01/15/19 1500  cefTAZidime (FORTAZ) 2 g in sodium chloride 0.9 % 100 mL IVPB     2 g 200 mL/hr over 30 Minutes Intravenous Every 8 hours 01/15/19 1448 01/19/19 0542   01/14/19 1400  colistimethate ((COLYMYCIN-M))  nebulized solution 150 mg  Status:  Discontinued     150 mg Inhalation 3 times daily 01/14/19 1142 01/19/19 1119   01/14/19 1300  meropenem (MERREM) 1 g in sodium chloride 0.9 % 100 mL IVPB  Status:  Discontinued     1 g 200 mL/hr over 30 Minutes Intravenous Every 8 hours 01/14/19 1235 01/15/19 1448   12/23/18 1506  vancomycin (VANCOCIN) 1-5 GM/200ML-% IVPB    Note to Pharmacy:  [facility]   : cabinet override      12/23/18 1506 12/23/18 1515   12/20/18 2200  vancomycin (VANCOCIN) IVPB 1000 mg/200 mL premix  Status:  Discontinued     1,000 mg 200 mL/hr over 60 Minutes Intravenous Every 12 hours 12/20/18 1804 12/22/18 1413   12/18/18 0800  ceFEPIme (MAXIPIME) 1 g in sodium chloride 0.9 % 100 mL IVPB  Status:  Discontinued     1 g 200 mL/hr over 30 Minutes Intravenous Every 8 hours 12/18/18 0713 12/19/18 1201   12/17/18 0530  vancomycin (VANCOCIN) 1,500 mg in sodium chloride 0.9 % 500 mL IVPB  Status:  Discontinued     1,500 mg 250 mL/hr over 120 Minutes Intravenous Every 12 hours 12/16/18 1704 12/20/18 1804   12/16/18 1730  cefTAZidime (FORTAZ) 2 g in sodium chloride 0.9 % 100 mL IVPB  Status:  Discontinued     2 g 200 mL/hr over 30 Minutes Intravenous Every 8 hours 12/16/18 1704 12/18/18 0702   12/16/18 1730  vancomycin (VANCOCIN) 2,000 mg in sodium chloride 0.9 % 500 mL IVPB     2,000 mg 250 mL/hr over 120 Minutes Intravenous  Once 12/16/18 1704 12/16/18 2030   12/07/18 1300  erythromycin 500 mg in sodium chloride 0.9 % 100 mL IVPB  Status:  Discontinued     500 mg 100 mL/hr over 60 Minutes Intravenous Every 6 hours 12/07/18 1118 12/16/18 1627   12/06/18 1500  vancomycin (VANCOCIN) 1,250 mg in sodium chloride 0.9 % 250 mL IVPB  Status:  Discontinued     1,250 mg 166.7 mL/hr over 90 Minutes Intravenous Every 12 hours 12/06/18 1436 12/07/18 1118   12/06/18 0100  vancomycin (VANCOCIN) 1,250 mg in sodium chloride 0.9 % 250 mL IVPB     1,250 mg 166.7 mL/hr over 90 Minutes Intravenous   Once 12/05/18 1054 12/06/18 0146   12/05/18 1200  vancomycin (VANCOCIN) 2,000 mg in sodium chloride 0.9 % 500 mL IVPB     2,000 mg 250 mL/hr over 120 Minutes Intravenous  Once 12/05/18 1054 12/05/18 1350   12/05/18 1200  cefTAZidime (FORTAZ) 1 g in sodium chloride 0.9 % 100 mL IVPB  Status:  Discontinued     1 g 200 mL/hr over 30 Minutes Intravenous Every 8 hours 12/05/18 1106 12/07/18 1118   11/30/18 2200  oseltamivir (TAMIFLU) 6 MG/ML suspension 30 mg     30 mg Per Tube 2 times daily 11/30/18 1348 12/03/18 2159   11/30/18 2200  ceFEPIme (MAXIPIME) 1 g in sodium chloride 0.9 % 100 mL IVPB  Status:  Discontinued     1 g 200 mL/hr over 30 Minutes  Intravenous Every 12 hours 11/30/18 1350 12/01/18 1039   11/29/18 2200  ceFEPIme (MAXIPIME) 2 g in sodium chloride 0.9 % 100 mL IVPB  Status:  Discontinued     2 g 200 mL/hr over 30 Minutes Intravenous Every 12 hours 11/29/18 1645 11/30/18 1350   11/29/18 1100  oseltamivir (TAMIFLU) 6 MG/ML suspension 75 mg  Status:  Discontinued     75 mg Per Tube 2 times daily 11/29/18 1000 11/30/18 1348   11/28/18 2300  oseltamivir (TAMIFLU) capsule 75 mg  Status:  Discontinued     75 mg Oral 2 times daily 11/28/18 2238 11/29/18 1000   11/28/18 2030  oseltamivir (TAMIFLU) capsule 75 mg     75 mg Oral  Once 11/28/18 2029 11/28/18 2207   11/28/18 1745  vancomycin (VANCOCIN) 2,000 mg in sodium chloride 0.9 % 500 mL IVPB     2,000 mg 250 mL/hr over 120 Minutes Intravenous  Once 11/28/18 1741 11/28/18 2203   11/28/18 1745  piperacillin-tazobactam (ZOSYN) IVPB 3.375 g     3.375 g 100 mL/hr over 30 Minutes Intravenous  Once 11/28/18 1741 11/28/18 1834      Procedures: See above  CONSULTS:  cardiology, pulmonary/intensive care and general surgery  Time spent: 25 minutes-Greater than 50% of this time was spent in counseling, explanation of diagnosis, planning of further management, and coordination of care.  MEDICATIONS: Scheduled Meds:  arformoterol  15  mcg Nebulization BID   aspirin  81 mg Per Tube Daily   atorvastatin  40 mg Per Tube q1800   budesonide (PULMICORT) nebulizer solution  0.5 mg Nebulization BID   carvedilol  3.125 mg Per Tube BID WC   [START ON 02/15/2019] collagenase   Topical Daily   enoxaparin (LOVENOX) injection  40 mg Subcutaneous Q24H   famotidine  20 mg Per Tube BID   folic acid  1 mg Per Tube Daily   guaiFENesin  5 mL Per Tube Q12H   insulin aspart  0-20 Units Subcutaneous TID WC   insulin detemir  25 Units Subcutaneous BID   multivitamin  15 mL Per Tube Daily   nutrition supplement (JUVEN)  1 packet Per Tube BID BM   [START ON 02/12/2019] sodium hypochlorite   Irrigation Daily   tapentadol  50 mg Oral QID   thiamine  100 mg Per Tube Daily   Continuous Infusions:  sodium chloride 10 mL/hr at 01/24/19 2000   feeding supplement (OSMOLITE 1.2 CAL) 45 mL/hr at 02/10/19 2300   PRN Meds:.sodium chloride, acetaminophen (TYLENOL) oral liquid 160 mg/5 mL, albuterol, alum & mag hydroxide-simeth **AND** lidocaine, bisacodyl, clonazePAM, docusate, ipratropium-albuterol, oxyCODONE, polyethylene glycol, Resource ThickenUp Clear, traMADol   PHYSICAL EXAM: Vital signs: Vitals:   02/11/19 0547 02/11/19 0909 02/11/19 0920 02/11/19 1318  BP: (!) 155/81  115/76 116/71  Pulse: 84  83 79  Resp: 19   18  Temp: 98.7 F (37.1 C)   98.4 F (36.9 C)  TempSrc: Oral   Oral  SpO2: 97% 97%  (!) 89%  Weight: 81.2 kg     Height:       Filed Weights   02/03/19 0554 02/04/19 0547 02/11/19 0547  Weight: 120.2 kg 81.2 kg 81.2 kg   Body mass index is 24.28 kg/m.   General appearance :Awake, alert, not in any distress.  HEENT: Atraumatic and Normocephalic Resp:Good air entry bilaterally, no added sounds  CVS: S1 S2 regular, no murmurs.  GI: Bowel sounds present, Non tender and not distended with no gaurding,  rigidity or rebound.No organomegaly Extremities: Left AKA Neurology: Nonfocal Musculoskeletal:No digital  cyanosis Skin:No Rash, warm and dry  I have personally reviewed following labs and imaging studies  LABORATORY DATA: CBC: Recent Labs  Lab 02/05/19 0323 02/06/19 0413 02/07/19 0549 02/09/19 0502 02/11/19 0320  WBC 9.9 9.0 7.4 4.8 7.1  NEUTROABS 7.8* 6.6 5.2 3.2 3.9  HGB 10.7* 9.2* 9.4* 9.0* 10.9*  HCT 33.9* 29.8* 31.2* 31.6* 36.0*  MCV 85.8 85.4 86.7 91.3 85.7  PLT 167 170 176 224 271    Basic Metabolic Panel: Recent Labs  Lab 02/05/19 0323 02/06/19 0413 02/07/19 0549 02/09/19 0502 02/11/19 0320  NA 133* 133* 134* 141 136  K 4.0 3.9 3.9 3.1* 4.5  CL 96* 98 98 100 97*  CO2 29 27 26 31  34*  GLUCOSE 191* 175* 166* 84 180*  BUN 23 17 13 14 16   CREATININE 0.58* 0.47* 0.42* 1.11 0.53*  CALCIUM 8.4* 8.1* 8.6* 9.6 8.8*  MG 1.8 1.8 1.8 2.4 1.7    GFR: Estimated Creatinine Clearance: 106.4 mL/min (A) (by C-G formula based on SCr of 0.53 mg/dL (L)).  Liver Function Tests: Recent Labs  Lab 02/09/19 0502  AST 19  ALT 8  ALKPHOS 108  BILITOT 1.7*  PROT 5.6*  ALBUMIN 3.0*   No results for input(s): LIPASE, AMYLASE in the last 168 hours. No results for input(s): AMMONIA in the last 168 hours.  Coagulation Profile: No results for input(s): INR, PROTIME in the last 168 hours.  Cardiac Enzymes: No results for input(s): CKTOTAL, CKMB, CKMBINDEX, TROPONINI in the last 168 hours.  BNP (last 3 results) No results for input(s): PROBNP in the last 8760 hours.  HbA1C: No results for input(s): HGBA1C in the last 72 hours.  CBG: Recent Labs  Lab 02/10/19 1211 02/10/19 1640 02/10/19 2113 02/11/19 0745 02/11/19 1202  GLUCAP 89 116* 208* 160* 146*    Lipid Profile: No results for input(s): CHOL, HDL, LDLCALC, TRIG, CHOLHDL, LDLDIRECT in the last 72 hours.  Thyroid Function Tests: No results for input(s): TSH, T4TOTAL, FREET4, T3FREE, THYROIDAB in the last 72 hours.  Anemia Panel: No results for input(s): VITAMINB12, FOLATE, FERRITIN, TIBC, IRON, RETICCTPCT in  the last 72 hours.  Urine analysis:    Component Value Date/Time   COLORURINE YELLOW 02/04/2019 1400   APPEARANCEUR HAZY (A) 02/04/2019 1400   LABSPEC 1.028 02/04/2019 1400   PHURINE 5.0 02/04/2019 1400   GLUCOSEU 150 (A) 02/04/2019 1400   HGBUR NEGATIVE 02/04/2019 1400   BILIRUBINUR NEGATIVE 02/04/2019 1400   KETONESUR NEGATIVE 02/04/2019 1400   PROTEINUR 100 (A) 02/04/2019 1400   NITRITE NEGATIVE 02/04/2019 1400   LEUKOCYTESUR NEGATIVE 02/04/2019 1400    Sepsis Labs: Lactic Acid, Venous    Component Value Date/Time   LATICACIDVEN 1.3 01/09/2019 0620    MICROBIOLOGY: Recent Results (from the past 240 hour(s))  Expectorated sputum assessment w rflx to resp cult     Status: None   Collection Time: 02/04/19 10:44 AM  Result Value Ref Range Status   Specimen Description SPUTUM  Final   Special Requests NONE  Final   Sputum evaluation   Final    THIS SPECIMEN IS ACCEPTABLE FOR SPUTUM CULTURE Performed at [facility], [address] [place], [place] [address]    Report Status 02/04/2019 FINAL  Final  Culture, respiratory     Status: None   Collection Time: 02/04/19 10:44 AM  Result Value Ref Range Status   Specimen Description SPUTUM  Final  Special Requests NONE Reflexed from [id number]  Final   Gram Stain   Final    ABUNDANT WBC PRESENT, PREDOMINANTLY PMN RARE SQUAMOUS EPITHELIAL CELLS PRESENT RARE GRAM POSITIVE RODS Performed at [facility], [address], [place], [place] [address]    Culture FEW PSEUDOMONAS AERUGINOSA  Final   Report Status 02/07/2019 FINAL  Final   Organism ID, Bacteria PSEUDOMONAS AERUGINOSA  Final      Susceptibility   Pseudomonas aeruginosa - MIC*    CEFTAZIDIME 4 SENSITIVE Sensitive     CIPROFLOXACIN <=0.25 SENSITIVE Sensitive     GENTAMICIN <=1 SENSITIVE Sensitive     IMIPENEM 2 SENSITIVE Sensitive     PIP/TAZO 16 SENSITIVE Sensitive     CEFEPIME 4 SENSITIVE Sensitive     * FEW PSEUDOMONAS AERUGINOSA  Culture, blood  (routine x 2)     Status: None   Collection Time: 02/04/19 11:52 AM  Result Value Ref Range Status   Specimen Description BLOOD LEFT ARM  Final   Special Requests   Final    BOTTLES DRAWN AEROBIC ONLY Blood Culture adequate volume   Culture   Final    NO GROWTH 5 DAYS Performed at [facility], [address] [place], [place] [address]    Report Status 02/09/2019 FINAL  Final  Culture, blood (routine x 2)     Status: None   Collection Time: 02/04/19 12:00 PM  Result Value Ref Range Status   Specimen Description BLOOD LEFT ARM  Final   Special Requests   Final    BOTTLES DRAWN AEROBIC ONLY Blood Culture adequate volume   Culture   Final    NO GROWTH 5 DAYS Performed at [facility], [address] [place], [place] [address]    Report Status 02/09/2019 FINAL  Final  Culture, Urine     Status: None   Collection Time: 02/04/19  1:54 PM  Result Value Ref Range Status   Specimen Description URINE, CLEAN CATCH  Final   Special Requests NONE  Final   Culture   Final    NO GROWTH Performed at [facility], [address] [place], [place] [address]    Report Status 02/05/2019 FINAL  Final    RADIOLOGY STUDIES/RESULTS: Mr Lumbar Spine W Wo Contrast  Result Date: 02/04/2019 CLINICAL DATA:  Coccygeal wound infection. EXAM: MRI LUMBAR SPINE WITHOUT AND WITH CONTRAST TECHNIQUE: Multiplanar and multiecho pulse sequences of the lumbar spine were obtained without and with intravenous contrast. CONTRAST:  7 mL Gadavist COMPARISON:  None. FINDINGS: Segmentation: Normal. The lowest disc space is considered to be L5-S1. Alignment:  Normal Vertebrae: There is no bone marrow edema. No compression fracture. No abnormal contrast enhancement. Normal T1-weighted signal is preserved throughout the visualized spine and sacrum. The coccyx is outside of the field of view. Conus medullaris and cauda equina: The conus medullaris terminates at the L1 level. The cauda equina and conus  medullaris are both normal. Paraspinal and other soft tissues: The visualized retroperitoneal organs and paraspinal soft tissues are normal. Disc levels: Sagittal plane imaging includes the T12-L1 disc level through the upper sacrum, with axial imaging of the L1-2 to L5-S1 disc levels. T12-L1: Normal. L1-2: Normal. L2-3: Disc desiccation with small bulge and superimposed central protrusion. No central spinal canal stenosis. No neural foraminal stenosis. Normal facets. L3-4: Disc desiccation and small central protrusion. No spinal canal or neural foraminal stenosis. L4-5: Minimal disc bulge no spinal canal or neural foraminal stenosis.  L5-S1: Central/right subarticular disc protrusion with narrowing of the right lateral recess. No central spinal canal stenosis or neural foraminal stenosis. The visualized portion of the sacrum is normal. IMPRESSION: 1. No evidence of active osteomyelitis of the lumbar spine or sacrum. Please note that the coccyx is outside the field of view. 2. No fluid collection or other focal abnormality of the soft tissues dorsal to the spine. 3. Multilevel mild degenerative disc disease. Mild narrowing of the right L5-S1 lateral recess could cause radicular symptoms in a right S1 distribution. Electronically Signed   By: Deatra Robinson M.D.   On: 02/04/2019 23:06   Dg Chest Port 1 View  Result Date: 02/04/2019 CLINICAL DATA:  Dyspnea. EXAM: PORTABLE CHEST 1 VIEW COMPARISON:  One-view chest x-ray 02/03/2019 FINDINGS: The heart is mildly enlarged. Mild pulmonary vascular congestion is stable. Elevation of the right hemidiaphragm is again noted. There is some volume loss on the right with associated atelectasis. No significant airspace consolidation is present. A small right effusion is not excluded. IMPRESSION: 1. Mild pulmonary vascular congestion without frank edema. 2. Similar appearance of right basilar atelectasis. Electronically Signed   By: Marin Roberts M.D.   On: 02/04/2019 08:31    Dg Chest Port 1 View  Result Date: 02/03/2019 CLINICAL DATA:  Shortness of breath EXAM: PORTABLE CHEST 1 VIEW COMPARISON:  01/19/2019 FINDINGS: Cardiac shadows within normal limits. Elevation of the right hemidiaphragm is noted with right basilar atelectasis. No other focal infiltrate is seen. Gastrostomy tube is noted within the stomach. IMPRESSION: Mild right basilar atelectasis. Electronically Signed   By: Alcide Clever M.D.   On: 02/03/2019 08:24   Dg Chest Port 1 View  Result Date: 01/19/2019 CLINICAL DATA:  Acute respiratory failure EXAM: PORTABLE CHEST 1 VIEW COMPARISON:  01/15/2019 FINDINGS: Cardiac shadow is stable. Left lung remains clear. Endotracheal tube is noted in satisfactory position. Right basilar atelectasis is noted increased from the prior exam. No pneumothorax is seen. No acute bony abnormality is noted. IMPRESSION: Increasing right basilar atelectasis. Electronically Signed   By: Alcide Clever M.D.   On: 01/19/2019 07:23   Dg Chest Port 1 View  Result Date: 01/15/2019 CLINICAL DATA:  Pulmonary insufficiency. SOB. EXAM: PORTABLE CHEST 1 VIEW COMPARISON:  01/12/2019. FINDINGS: Unchanged cardiomediastinal silhouette. Mild increased perihilar markings without consolidation or edema. No bony abnormality. Unchanged tracheostomy. No acute osseous findings. IMPRESSION: Improved aeration.  Mild increased perihilar markings Electronically Signed   By: Elsie Stain M.D.   On: 01/15/2019 07:06   Dg Swallowing Func-speech Pathology  Result Date: 02/04/2019 Objective Swallowing Evaluation: Type of Study: MBS-Modified Barium Swallow Study  Patient Details Name: Jashawn Floyd. MRN: [id number] Date of Birth: June 27, 1956 Today's Date: 02/04/2019 Time: SLP Start Time (ACUTE ONLY): 0920 -SLP Stop Time (ACUTE ONLY): 0931 SLP Time Calculation (min) (ACUTE ONLY): 11 min Past Medical History: Past Medical History: Diagnosis Date  Coronary artery disease   Diabetes mellitus without complication  (HCC)   Hyperlipidemia   Hypertension   Osteomyelitis (HCC) 03/25/2017  RT FOOT Past Surgical History: Past Surgical History: Procedure Laterality Date  AMPUTATION Right 03/27/2017  Procedure: 1st Ray Amputation Right Foot;  Surgeon: Nadara Mustard, MD;  [facility] OR;  Service: Orthopedics;  Laterality: Right;  APPENDECTOMY    BELOW KNEE LEG AMPUTATION Left   CARDIAC CATHETERIZATION    CORONARY STENT INTERVENTION  2005  Great toe amputation right.    I&D EXTREMITY Left 12/30/2015  Procedure: IRRIGATION AND DEBRIDEMENT LEFT FOOT, TRANSMETATARSAL AMPUTATION  WITH APPLICATION OF ANTIBIOTIC BEADS AND WOUND VAC;  Surgeon: Nadara Mustard, MD;  Location: [facility] OR;  Service: Orthopedics;  Laterality: Left;  IR GASTROSTOMY TUBE MOD SED  12/23/2018  TONSILLECTOMY    TRACHEOSTOMY  11/2018 HPI: 63 year old male coming in with fever cough chest pain and shortness of breath was found to have influenza A. PMH: diabetes mellitus, hypertension.  Intubated multiple times 1/3 to 1/16 with trach placed 1/16. Repeat MBS on 3/28 for possible initiation of po's. This is pt's 3rd MBS. Plan to repeat to determine upgrade initially; on 3/10 concern pt may have aspirated and made NPO.   Subjective: pt is alert and cooperative but needs encouragement Assessment / Plan / Recommendation CHL IP CLINICAL IMPRESSIONS 02/04/2019 Clinical Impression Pt has been decannulated since 3/9 with evidence of incomplete stoma closure indicated by breathy vocal quality. He continues to exhibit moderate-severe pharyngeal dysphagia noteably due to incompetent laryngeal closure and poor retroflexion of epiglottis. Honey thick penetrated vestibule with initial swallow of MBS and expelled with cues for cough and was not penetrated with subsequent trial. Nectar thick entered vestibule with penetration aspiration score (PAS) of 3, 5 & 7 (did not exit trachea with cough). Chin tuck actually facilitated entrance of barium into trachea and swallow with head in  neutral applying pressure to gauze/stoma to increase pressure was ineffective. Mild-mod amount of vallecular and pyriform sinus residue not significantly improved. Recommend pt resume po's of honey thick liquids, texture of Dys 2, multiple swallows and hard coughs/throat clear after every other bite/sip. Pt should continue to receive ST at skilled nursing facility for continued intervention and repeat MBS when appropriate (not before 6-8 weeks). Will continue to follow while here.  SLP Visit Diagnosis Dysphagia, pharyngeal phase (R13.13) Attention and concentration deficit following -- Frontal lobe and executive function deficit following -- Impact on safety and function Severe aspiration risk   CHL IP TREATMENT RECOMMENDATION 02/04/2019 Treatment Recommendations Therapy as outlined in treatment plan below   Prognosis 02/04/2019 Prognosis for Safe Diet Advancement Good Barriers to Reach Goals -- Barriers/Prognosis Comment -- CHL IP DIET RECOMMENDATION 02/04/2019 SLP Diet Recommendations Dysphagia 2 (Fine chop) solids;Honey thick liquids Liquid Administration via Cup;No straw Medication Administration Whole meds with puree Compensations Slow rate;Small sips/bites;Multiple dry swallows after each bite/sip;Hard cough after swallow;Clear throat intermittently Postural Changes Seated upright at 90 degrees;Remain semi-upright after after feeds/meals (Comment)   CHL IP OTHER RECOMMENDATIONS 02/04/2019 Recommended Consults -- Oral Care Recommendations Oral care BID Other Recommendations --   CHL IP FOLLOW UP RECOMMENDATIONS 02/04/2019 Follow up Recommendations Skilled Nursing facility   [facility] IP FREQUENCY AND DURATION 02/04/2019 Speech Therapy Frequency (ACUTE ONLY) min 2x/week Treatment Duration 2 weeks      CHL IP ORAL PHASE 02/04/2019 Oral Phase WFL Oral - Pudding Teaspoon -- Oral - Pudding Cup -- Oral - Honey Teaspoon -- Oral - Honey Cup WFL Oral - Nectar Teaspoon -- Oral - Nectar Cup WFL Oral - Nectar Straw -- Oral - Thin  Teaspoon -- Oral - Thin Cup NT Oral - Thin Straw -- Oral - Puree NT Oral - Mech Soft -- Oral - Regular NT Oral - Multi-Consistency -- Oral - Pill -- Oral Phase - Comment --  CHL IP PHARYNGEAL PHASE 02/04/2019 Pharyngeal Phase Impaired Pharyngeal- Pudding Teaspoon -- Pharyngeal -- Pharyngeal- Pudding Cup -- Pharyngeal -- Pharyngeal- Honey Teaspoon NT Pharyngeal -- Pharyngeal- Honey Cup Penetration/Aspiration during swallow;Pharyngeal residue - valleculae;Pharyngeal residue - pyriform;Reduced airway/laryngeal closure Pharyngeal Material enters airway, remains ABOVE vocal cords and  not ejected out Pharyngeal- Nectar Teaspoon NT Pharyngeal -- Pharyngeal- Nectar Cup Penetration/Aspiration during swallow;Reduced airway/laryngeal closure;Pharyngeal residue - valleculae;Pharyngeal residue - pyriform Pharyngeal Material enters airway, passes BELOW cords and not ejected out despite cough attempt by patient;Material enters airway, CONTACTS cords and not ejected out Pharyngeal- Nectar Straw -- Pharyngeal -- Pharyngeal- Thin Teaspoon NT Pharyngeal -- Pharyngeal- Thin Cup NT Pharyngeal -- Pharyngeal- Thin Straw -- Pharyngeal -- Pharyngeal- Puree NT Pharyngeal -- Pharyngeal- Mechanical Soft -- Pharyngeal -- Pharyngeal- Regular NT Pharyngeal -- Pharyngeal- Multi-consistency -- Pharyngeal -- Pharyngeal- Pill -- Pharyngeal -- Pharyngeal Comment --  CHL IP CERVICAL ESOPHAGEAL PHASE 02/04/2019 Cervical Esophageal Phase WFL Pudding Teaspoon -- Pudding Cup -- Honey Teaspoon -- Honey Cup -- Nectar Teaspoon -- Nectar Cup -- Nectar Straw -- Thin Teaspoon -- Thin Cup -- Thin Straw -- Puree -- Mechanical Soft -- Regular -- Multi-consistency -- Pill -- Cervical Esophageal Comment -- Royce Macadamia 02/04/2019, 10:23 AM Breck Coons [employer]ell [employer] administrator Pager [phone] Office [phone]              Dg Swallowing Func-speech Pathology  Result Date: 01/30/2019 Objective Swallowing Evaluation: Type of Study:  MBS-Modified Barium Swallow Study  Patient Details Name: Joao Mccurdy. MRN: [id number] Date of Birth: 02-Nov-1956 Today's Date: 01/30/2019 Time: SLP Start Time (ACUTE ONLY): 1328 -SLP Stop Time (ACUTE ONLY): 1348 SLP Time Calculation (min) (ACUTE ONLY): 20 min Past Medical History: Past Medical History: Diagnosis Date  Coronary artery disease   Diabetes mellitus without complication (HCC)   Hyperlipidemia   Hypertension   Osteomyelitis (HCC) 03/25/2017  RT FOOT Past Surgical History: Past Surgical History: Procedure Laterality Date  AMPUTATION Right 03/27/2017  Procedure: 1st Ray Amputation Right Foot;  Surgeon: Nadara Mustard, MD;  Location: [facility] OR;  Service: Orthopedics;  Laterality: Right;  APPENDECTOMY    BELOW KNEE LEG AMPUTATION Left   CARDIAC CATHETERIZATION    CORONARY STENT INTERVENTION  2005  Great toe amputation right.    I&D EXTREMITY Left 12/30/2015  Procedure: IRRIGATION AND DEBRIDEMENT LEFT FOOT, TRANSMETATARSAL AMPUTATION WITH APPLICATION OF ANTIBIOTIC BEADS AND WOUND VAC;  Surgeon: Nadara Mustard, MD;  Location: [facility] OR;  Service: Orthopedics;  Laterality: Left;  IR GASTROSTOMY TUBE MOD SED  12/23/2018  TONSILLECTOMY    TRACHEOSTOMY  11/2018 HPI: 63 year old male coming in with fever cough chest pain and shortness of breath was found to have influenza A. PMH: diabetes mellitus, hypertension.  Intubated multiple times 1/3 to 1/16 with trach placed 1/16. Repeat MBS on 3/28 for possible initiation of po's.  Subjective: pt is alert and cooperative but needs encouragement Assessment / Plan / Recommendation CHL IP CLINICAL IMPRESSIONS 01/30/2019 Clinical Impression Pt's trach was capped this morning and during MBS with normal oropharyngeal pressures. Swallow function has improved from prior study despite silent aspiration with thin and penetration of nectar and honey. Laryngeal excursion, pharyngeal contraction and epiglottic inversion are not of normal strength and ROM however improved from last  week. Incomplete closure led to penetration with thin, nectar and only flash with honey thick x 1. Chin tuck did not prevent penetration (silent) with nectar and vallecular and pyriform sinus residue present and was mildly increased with honey thick. Verbal cues to perform second swallow reduced volume. Puree was not penetrated this study. Recommend Dys 2 given mild oral residue, impulsivity and deconditioning. Honey thick liquids recommended with two swallows, intermittent throat clears and pills whole in applesauce. ST will continue to follow for safety and upgrade of  po textures. SLP Visit Diagnosis Dysphagia, oropharyngeal phase (R13.12) Attention and concentration deficit following -- Frontal lobe and executive function deficit following -- Impact on safety and function Moderate aspiration risk;Severe aspiration risk   CHL IP TREATMENT RECOMMENDATION 01/30/2019 Treatment Recommendations Therapy as outlined in treatment plan below   Prognosis 01/30/2019 Prognosis for Safe Diet Advancement Good Barriers to Reach Goals -- Barriers/Prognosis Comment -- CHL IP DIET RECOMMENDATION 01/30/2019 SLP Diet Recommendations Honey thick liquids;Dysphagia 2 (Fine chop) solids Liquid Administration via Cup;No straw Medication Administration Whole meds with puree Compensations Slow rate;Small sips/bites;Multiple dry swallows after each bite/sip;Clear throat intermittently Postural Changes Seated upright at 90 degrees   CHL IP OTHER RECOMMENDATIONS 01/30/2019 Recommended Consults -- Oral Care Recommendations Oral care BID Other Recommendations --   CHL IP FOLLOW UP RECOMMENDATIONS 01/30/2019 Follow up Recommendations (No Data)   CHL IP FREQUENCY AND DURATION 01/30/2019 Speech Therapy Frequency (ACUTE ONLY) min 2x/week Treatment Duration 2 weeks      CHL IP ORAL PHASE 01/30/2019 Oral Phase Impaired Oral - Pudding Teaspoon -- Oral - Pudding Cup -- Oral - Honey Teaspoon -- Oral - Honey Cup Lingual/palatal residue Oral - Nectar Teaspoon -- Oral -  Nectar Cup Lingual/palatal residue Oral - Nectar Straw -- Oral - Thin Teaspoon -- Oral - Thin Cup -- Oral - Thin Straw -- Oral - Puree Lingual/palatal residue Oral - Mech Soft -- Oral - Regular WFL Oral - Multi-Consistency -- Oral - Pill -- Oral Phase - Comment --  CHL IP PHARYNGEAL PHASE 01/30/2019 Pharyngeal Phase Impaired Pharyngeal- Pudding Teaspoon -- Pharyngeal -- Pharyngeal- Pudding Cup -- Pharyngeal -- Pharyngeal- Honey Teaspoon NT Pharyngeal -- Pharyngeal- Honey Cup Pharyngeal residue - valleculae;Pharyngeal residue - pyriform;Reduced epiglottic inversion;Penetration/Aspiration during swallow;Reduced airway/laryngeal closure Pharyngeal Material enters airway, remains ABOVE vocal cords then ejected out Pharyngeal- Nectar Teaspoon NT Pharyngeal -- Pharyngeal- Nectar Cup Penetration/Aspiration during swallow;Reduced airway/laryngeal closure;Reduced epiglottic inversion;Pharyngeal residue - valleculae;Pharyngeal residue - pyriform;Compensatory strategies attempted (with notebox) Pharyngeal Material enters airway, remains ABOVE vocal cords and not ejected out Pharyngeal- Nectar Straw -- Pharyngeal -- Pharyngeal- Thin Teaspoon NT Pharyngeal -- Pharyngeal- Thin Cup Penetration/Aspiration during swallow;Pharyngeal residue - valleculae;Pharyngeal residue - pyriform;Moderate aspiration;Trace aspiration;Reduced airway/laryngeal closure;Reduced epiglottic inversion Pharyngeal Material enters airway, passes BELOW cords without attempt by patient to eject out (silent aspiration) Pharyngeal- Thin Straw -- Pharyngeal -- Pharyngeal- Puree Pharyngeal residue - pyriform;Pharyngeal residue - valleculae;Reduced epiglottic inversion Pharyngeal Material does not enter airway Pharyngeal- Mechanical Soft -- Pharyngeal -- Pharyngeal- Regular WFL Pharyngeal -- Pharyngeal- Multi-consistency -- Pharyngeal -- Pharyngeal- Pill -- Pharyngeal -- Pharyngeal Comment --  CHL IP CERVICAL ESOPHAGEAL PHASE 01/30/2019 Cervical Esophageal Phase WFL  Pudding Teaspoon -- Pudding Cup -- Honey Teaspoon -- Honey Cup -- Nectar Teaspoon -- Nectar Cup -- Nectar Straw -- Thin Teaspoon -- Thin Cup -- Thin Straw -- Puree -- Mechanical Soft -- Regular -- Multi-consistency -- Pill -- Cervical Esophageal Comment -- Royce Macadamia 01/30/2019, 2:53 PM Breck Coons [employer] administrator Pager [phone] Office [phone]              Dg Swallowing Func-speech Pathology  Result Date: 01/23/2019 Objective Swallowing Evaluation: Type of Study: MBS-Modified Barium Swallow Study  Patient Details Name: Candy Ziegler. MRN: [id number] Date of Birth: 04-Mar-1956 Today's Date: 01/23/2019 Time: SLP Start Time (ACUTE ONLY): 1254 -SLP Stop Time (ACUTE ONLY): 1308 SLP Time Calculation (min) (ACUTE ONLY): 14 min Past Medical History: Past Medical History: Diagnosis Date  Coronary artery disease   Diabetes mellitus without  complication (HCC)   Hyperlipidemia   Hypertension   Osteomyelitis (HCC) 03/25/2017  RT FOOT Past Surgical History: Past Surgical History: Procedure Laterality Date  AMPUTATION Right 03/27/2017  Procedure: 1st Ray Amputation Right Foot;  Surgeon: Nadara Mustard, MD;  Location: [facility] OR;  Service: Orthopedics;  Laterality: Right;  APPENDECTOMY    BELOW KNEE LEG AMPUTATION Left   CARDIAC CATHETERIZATION    CORONARY STENT INTERVENTION  2005  Great toe amputation right.    I&D EXTREMITY Left 12/30/2015  Procedure: IRRIGATION AND DEBRIDEMENT LEFT FOOT, TRANSMETATARSAL AMPUTATION WITH APPLICATION OF ANTIBIOTIC BEADS AND WOUND VAC;  Surgeon: Nadara Mustard, MD;  Location: [facility] OR;  Service: Orthopedics;  Laterality: Left;  IR GASTROSTOMY TUBE MOD SED  12/23/2018  TONSILLECTOMY    TRACHEOSTOMY  11/2018 HPI: 63 year old male coming in with fever cough chest pain and shortness of breath was found to have influenza A. PMH: diabetes mellitus, hypertension.  Intubated multiple times 1/3 to 1/16 with trach placed 1/16.   Subjective: pt is alert and  cooperative but needs encouragement Assessment / Plan / Recommendation CHL IP CLINICAL IMPRESSIONS 01/23/2019 Clinical Impression Pt has a moderate pharyngeal dysphagia due to reduced strength and airway closure s/p prolonged (and repeated) intubations. His timing for swallow trigger is good, but he has reduced hyolaryngeal movement, pharyngeal squeeze, epiglottic inversion, and tonge base retraction. Thin and nectar thick liquids enter the laryngeal vestibule during the swallow because of his incomplete closure, with penetration intermittently reaching the true vocal folds without spontaneous cough elicited. Multiple cued coughs were required to eject penetrates. Honey thick liquids and purees left more residue in the valleculae and pyriform sinuses, with intermittent penetration after the swallow from the residual material. Recommend he remain NPO except for initiating small amounts of purees and spoonfuls of thickened liquids during SLP visits only to facilitate restrengthening. Pt may also benefit from EMST and other exercises to progress strength of cough and pharyngeal musculature.   SLP Visit Diagnosis Dysphagia, pharyngeal phase (R13.13) Attention and concentration deficit following -- Frontal lobe and executive function deficit following -- Impact on safety and function Moderate aspiration risk   CHL IP TREATMENT RECOMMENDATION 01/23/2019 Treatment Recommendations Therapy as outlined in treatment plan below   Prognosis 01/23/2019 Prognosis for Safe Diet Advancement Good Barriers to Reach Goals -- Barriers/Prognosis Comment -- CHL IP DIET RECOMMENDATION 01/23/2019 SLP Diet Recommendations NPO Liquid Administration via -- Medication Administration Via alternative means Compensations -- Postural Changes --   CHL IP OTHER RECOMMENDATIONS 01/23/2019 Recommended Consults -- Oral Care Recommendations Oral care QID Other Recommendations Have oral suction available   CHL IP FOLLOW UP RECOMMENDATIONS 01/23/2019 Follow up  Recommendations Inpatient Rehab   CHL IP FREQUENCY AND DURATION 01/23/2019 Speech Therapy Frequency (ACUTE ONLY) min 2x/week Treatment Duration 2 weeks      CHL IP ORAL PHASE 01/23/2019 Oral Phase WFL Oral - Pudding Teaspoon -- Oral - Pudding Cup -- Oral - Honey Teaspoon -- Oral - Honey Cup -- Oral - Nectar Teaspoon -- Oral - Nectar Cup -- Oral - Nectar Straw -- Oral - Thin Teaspoon -- Oral - Thin Cup -- Oral - Thin Straw -- Oral - Puree -- Oral - Mech Soft -- Oral - Regular -- Oral - Multi-Consistency -- Oral - Pill -- Oral Phase - Comment --  CHL IP PHARYNGEAL PHASE 01/23/2019 Pharyngeal Phase Impaired Pharyngeal- Pudding Teaspoon -- Pharyngeal -- Pharyngeal- Pudding Cup -- Pharyngeal -- Pharyngeal- Honey Teaspoon Reduced pharyngeal peristalsis;Reduced epiglottic inversion;Reduced anterior laryngeal mobility;Reduced laryngeal elevation;Reduced  airway/laryngeal closure;Reduced tongue base retraction;Pharyngeal residue - valleculae;Pharyngeal residue - pyriform Pharyngeal -- Pharyngeal- Honey Cup -- Pharyngeal -- Pharyngeal- Nectar Teaspoon Reduced pharyngeal peristalsis;Reduced epiglottic inversion;Reduced anterior laryngeal mobility;Reduced laryngeal elevation;Reduced airway/laryngeal closure;Reduced tongue base retraction;Pharyngeal residue - valleculae;Pharyngeal residue - pyriform;Penetration/Aspiration during swallow Pharyngeal Material enters airway, remains ABOVE vocal cords and not ejected out Pharyngeal- Nectar Cup Reduced pharyngeal peristalsis;Reduced epiglottic inversion;Reduced anterior laryngeal mobility;Reduced laryngeal elevation;Reduced airway/laryngeal closure;Reduced tongue base retraction;Pharyngeal residue - valleculae;Pharyngeal residue - pyriform;Penetration/Aspiration during swallow Pharyngeal Material enters airway, remains ABOVE vocal cords and not ejected out Pharyngeal- Nectar Straw -- Pharyngeal -- Pharyngeal- Thin Teaspoon Reduced pharyngeal peristalsis;Reduced epiglottic  inversion;Reduced anterior laryngeal mobility;Reduced laryngeal elevation;Reduced airway/laryngeal closure;Reduced tongue base retraction;Penetration/Aspiration during swallow Pharyngeal Material enters airway, CONTACTS cords and not ejected out Pharyngeal- Thin Cup -- Pharyngeal -- Pharyngeal- Thin Straw -- Pharyngeal -- Pharyngeal- Puree Reduced pharyngeal peristalsis;Reduced epiglottic inversion;Reduced anterior laryngeal mobility;Reduced laryngeal elevation;Reduced airway/laryngeal closure;Reduced tongue base retraction;Pharyngeal residue - valleculae;Pharyngeal residue - pyriform;Penetration/Apiration after swallow Pharyngeal Material enters airway, remains ABOVE vocal cords and not ejected out Pharyngeal- Mechanical Soft -- Pharyngeal -- Pharyngeal- Regular -- Pharyngeal -- Pharyngeal- Multi-consistency -- Pharyngeal -- Pharyngeal- Pill -- Pharyngeal -- Pharyngeal Comment --  CHL IP CERVICAL ESOPHAGEAL PHASE 01/23/2019 Cervical Esophageal Phase WFL Pudding Teaspoon -- Pudding Cup -- Honey Teaspoon -- Honey Cup -- Nectar Teaspoon -- Nectar Cup -- Nectar Straw -- Thin Teaspoon -- Thin Cup -- Thin Straw -- Puree -- Mechanical Soft -- Regular -- Multi-consistency -- Pill -- Cervical Esophageal Comment -- Virl Axe Nix 01/23/2019, 2:32 PM  [name], M.A. CCC-SLP Acute Rehabilitation Services Pager [phone] Office [phone]               LOS: 75 days   Jeoffrey Massed, MD  [facility]  If 7PM-7AM, please contact night-coverage  Please page via [email or web address]  Go to [email or web address] and use [place]'s universal password to access. If you do not have the password, please contact the hospital operator.  Locate the [facility] provider you are looking for under [facility] and page to a number that you can be directly reached. If you still have difficulty reaching the provider, please page the [facility] (Director on Call) for the Hospitalists listed on amion for assistance.  02/11/2019, 1:33 PM

## 2019-02-11 NOTE — Progress Notes (Signed)
Occupational Therapy Treatment Patient Details Name: Antonio [name]. MRN: [id number] DOB: Apr 24, 1956 Today's Date: 02/11/2019    History of present illness Pt is a 63 y.o. male admitted 11/28/18 with flu A and PNA; acute respiratory failure intubated 1/4. Trach placed 1/16. PEG placed 1/28. Hospital course complicared by acute MI, CHF, cardiogenic shock, and mucous plugging. Also with sacral wound.  De-cannulated 02/02/19. PMH includes DM, HTN, HLD, CAD, L BKA, R transmet amputation.   OT comments  Pt agreeable to transfer from bed to recliner- modA for supine to sitting EOB mostly for trunk elevation and ModA for donning pants over RLE and to pull pants to waist in standing. Pt sore from sacral wound, but agreeable to repositioning. Pt sit to stand with minguardA. Pt performing ADl functional mobility 15' from bed to recliner with minguardA and use of RW. Pt Mod I for donning LLE prosthetic. Pt set-upA for grooming seated in recliner. Pt continues to require assist with ADL decreasing ability for independence. Pt would greatly benefit from continued OT in SNF vs HHOT setting as pt is progressing, but requires assist for basic ADL for safety and stability. OT to follow acutely.    Follow Up Recommendations  SNF;Home health OT;Supervision - Intermittent    Equipment Recommendations  Other (comment)(to be determined)    Recommendations for Other Services      Precautions / Restrictions Precautions Precautions: Fall Precaution Comments: PEG, monitor O2 sats Required Braces or Orthoses: Other Brace(LLE prosthetic) Other Brace: L LE prosthesis Restrictions Weight Bearing Restrictions: No LLE Weight Bearing: Weight bearing as tolerated       Mobility Bed Mobility Overal bed mobility: Needs Assistance Bed Mobility: Sidelying to Sit;Rolling Rolling: Supervision Sidelying to sit: Supervision Supine to sit: Mod assist        Transfers Overall transfer level: Needs  assistance Equipment used: Rolling walker (2 wheeled) Transfers: Sit to/from Stand Sit to Stand: Min guard         General transfer comment: minguardA for safety with rw    Balance Overall balance assessment: Needs assistance   Sitting balance-Leahy Scale: Good       Standing balance-Leahy Scale: Poor                             ADL either performed or assessed with clinical judgement   ADL Overall ADL's : Needs assistance/impaired     Grooming: Set up;Wash/dry hands;Wash/dry face;Oral care;Brushing hair;Sitting               Lower Body Dressing: Moderate assistance;Sitting/lateral leans;Sit to/from stand(pull pants to waist)               Functional mobility during ADLs: Min guard;Rolling walker General ADL Comments: Mostly for standing assist for all ADL     Vision   Vision Assessment?: No apparent visual deficits   Perception     Praxis      Cognition Arousal/Alertness: Awake/alert                                              Exercises     Shoulder Instructions       General Comments Pt particular about performing ADL and mobility a certain way. Pt motivated to return home.    Pertinent Vitals/ Pain       Pain  Assessment: 0-10 Pain Score: 2  Pain Location: Sacral wound Pain Descriptors / Indicators: Grimacing;Discomfort Pain Intervention(s): Repositioned  Home Living                                          Prior Functioning/Environment              Frequency  Min 2X/week        Progress Toward Goals  OT Goals(current goals can now be found in the care plan section)  Progress towards OT goals: Progressing toward goals  Acute Rehab OT Goals Patient Stated Goal: to get stronger so he can go home.  OT Goal Formulation: With patient Time For Goal Achievement: 02/25/19 Potential to Achieve Goals: Good ADL Goals Pt Will Perform Grooming: standing;with supervision Pt Will  Perform Upper Body Dressing: with set-up;sitting Pt Will Transfer to Toilet: with supervision;ambulating;bedside commode Additional ADL Goal #1: Pt will perform bed mobility modified independently from flat bed without rails. Additional ADL Goal #2: Pt will gather items necessary for ADL around his room with RW and min assist. Additional ADL Goal #3: Pt will follow 50% on cues in quiet environement during ADL  Plan Discharge plan remains appropriate    Co-evaluation                 AM-PAC OT "6 Clicks" Daily Activity     Outcome Measure   Help from another person eating meals?: None Help from another person taking care of personal grooming?: None Help from another person toileting, which includes using toliet, bedpan, or urinal?: Total Help from another person bathing (including washing, rinsing, drying)?: A Lot Help from another person to put on and taking off regular upper body clothing?: A Little Help from another person to put on and taking off regular lower body clothing?: A Lot 6 Click Score: 16    End of Session Equipment Utilized During Treatment: Oxygen  OT Visit Diagnosis: Unsteadiness on feet (R26.81);Other abnormalities of gait and mobility (R26.89);Muscle weakness (generalized) (M62.81);Other symptoms and signs involving cognitive function   Activity Tolerance Patient tolerated treatment well   Patient Left in chair;with call bell/phone within reach;with chair alarm set   Nurse Communication Mobility status        Time: 2575-0518 OT Time Calculation (min): 48 min  Charges: OT General Charges $OT Visit: 1 Visit OT Treatments $Self Care/Home Management : 8-22 mins $Neuromuscular Re-education: 38-52 mins  Antonio [name]) Glendell Docker OTR/L Acute Rehabilitation Services Pager: [phone] Office: [phone]    Sandrea Hughs 02/11/2019, 4:52 PM

## 2019-02-11 NOTE — Progress Notes (Signed)
Physical Therapy Wound Treatment Patient Details  Name: Antonio [name]. MRN: [id number] Date of Birth: May 09, 1956  Today's Date: 02/11/2019 Time: 1000-1046 Time Calculation (min): 46 min  Subjective  Subjective: "I just want this to go away." Patient and Family Stated Goals: "go home." Date of Onset: (unknown) Prior Treatments: dressing changes  Pain Score: Faces: 8 with hydrotherapy; pre medicated  Wound Assessment  Pressure Injury 12/01/18 Unstageable - Full thickness tissue loss in which the base of the ulcer is covered by slough (yellow, tan, gray, green or brown) and/or eschar (tan, brown or black) in the wound bed. sacrum (Active)  Dressing Type Gauze (Comment);Moist to moist;ABD;Barrier Film (skin prep);Other (Comment);Impregnated gauze (petrolatum) 02/11/2019 10:58 AM  Dressing Clean;Dry;Intact;Changed 02/11/2019 10:58 AM  Dressing Change Frequency Twice a day 02/11/2019 10:58 AM  State of Healing Early/partial granulation 02/11/2019 10:58 AM  Site / Wound Assessment Red;Yellow;Pink 02/11/2019 10:58 AM  % Wound base Red or Granulating 55% 02/11/2019 10:58 AM  % Wound base Yellow/Fibrinous Exudate 45% 02/11/2019 10:58 AM  % Wound base Black/Eschar 0% 02/11/2019 10:58 AM  % Wound base Other/Granulation Tissue (Comment) 0% 02/11/2019 10:58 AM  Peri-wound Assessment Erythema (blanchable);Pink 02/11/2019 10:58 AM  Wound Length (cm) 7 cm 02/10/2019 11:27 AM  Wound Width (cm) 5 cm 02/10/2019 11:27 AM  Wound Depth (cm) 4 cm 02/10/2019 11:27 AM  Wound Surface Area (cm^2) 35 cm^2 02/10/2019 11:27 AM  Wound Volume (cm^3) 140 cm^3 02/10/2019 11:27 AM  Tunneling (cm) 4.5 02/10/2019 11:27 AM  Margins Unattached edges (unapproximated) 02/11/2019 10:58 AM  Drainage Amount Copious 02/11/2019 10:58 AM  Drainage Description Purulent;Green;Odor 02/11/2019 10:58 AM  Treatment Debridement (Selective);Hydrotherapy (Pulse lavage);Packing (Saline gauze) 02/11/2019 10:58 AM   Santyl applied to wound bed prior  to applying dressing.   Hydrotherapy Pulsed lavage therapy - wound location: sacrum Pulsed Lavage with Suction (psi): 12 psi Pulsed Lavage with Suction - Normal Saline Used: 1000 mL Pulsed Lavage Tip: Tip with splash shield Selective Debridement Selective Debridement - Location: sacrum Selective Debridement - Tools Used: Forceps;Scissors Selective Debridement - Tissue Removed: necrotic tissue   Wound Assessment and Plan  Wound Therapy - Assess/Plan/Recommendations Wound Therapy - Clinical Statement: Wound depth and tunneling at 7 o clock continues to increase. Copious purulent, green drainage present today. WOC RN present and recommended initiation of Dakins.   Wound Therapy - Functional Problem List: decr mobility Factors Delaying/Impairing Wound Healing: Diabetes Mellitus;Immobility;Multiple medical problems Hydrotherapy Plan: Debridement;Dressing change;Patient/family education;Pulsatile lavage with suction Wound Therapy - Frequency: 6X / week Wound Therapy - Follow Up Recommendations: Skilled nursing facility Wound Plan: see above  Wound Therapy Goals- Improve the function of patient's integumentary system by progressing the wound(s) through the phases of wound healing (inflammation - proliferation - remodeling) by: Decrease Necrotic Tissue to: 40 Decrease Necrotic Tissue - Progress: Progressing toward goal Increase Granulation Tissue to: 60 Increase Granulation Tissue - Progress: Progressing toward goal  Goals will be updated until maximal potential achieved or discharge criteria met.  Discharge criteria: when goals achieved, discharge from hospital, MD decision/surgical intervention, no progress towards goals, refusal/missing three consecutive treatments without notification or medical reason.  GP  [name], PT, DPT Acute Rehabilitation Services Pager [phone] Office [phone]      [name] 02/11/2019, 11:03 AM

## 2019-02-11 NOTE — Consult Note (Signed)
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 Wound bed: 55% red, 45% yellow slough Drainage (amount, consistency, odor) copious blue green drainage, malodorous Periwound: intact and blanchable Dressing procedure/placement/frequency: Due to blue green drainage and odor I will order Dakins Solution for 3 days starting Thursday 3/19 and will restart the [facility] Sunday. WOC team will follow next week, if assistance needed prior to next visit, please re-consult.  Barnett Hatter, RN-C, WTA-C, OCA Wound Treatment Associate Ostomy Care Associate

## 2019-02-12 LAB — GLUCOSE, CAPILLARY
Glucose-Capillary: 111 mg/dL — ABNORMAL HIGH (ref 70–99)
Glucose-Capillary: 171 mg/dL — ABNORMAL HIGH (ref 70–99)
Glucose-Capillary: 171 mg/dL — ABNORMAL HIGH (ref 70–99)
Glucose-Capillary: 151 mg/dL — ABNORMAL HIGH (ref 70–99)

## 2019-02-12 MED ORDER — ADULT MULTIVITAMIN W/MINERALS CH
1.0000 | ORAL_TABLET | Freq: Every day | ORAL | Status: DC
  Administered 2019-02-13 – 2019-02-20 (×8): 1 via ORAL
  Filled 2019-02-12 (×8): qty 1

## 2019-02-12 MED ORDER — PRO-STAT SUGAR FREE PO LIQD
30.0000 mL | Freq: Two times a day (BID) | ORAL | Status: DC
  Administered 2019-02-12 – 2019-02-15 (×5): 30 mL via ORAL
  Filled 2019-02-12 (×13): qty 30

## 2019-02-12 NOTE — Progress Notes (Addendum)
Nutrition Follow-up  INTERVENTION:    Prostat liquid protein po 30 ml BID with meals, each supplement provides 100 kcal, 15 grams protein  MVI with minerals po daily   D/C Osmolite 1.2 formula  NEW NUTRITION DIAGNOSIS:   Increased nutrient needs related to wound healing, acute illness as evidenced by estimated needs, ongoing  GOAL:   Patient will meet greater than or equal to 90% of their needs, progressing  MONITOR:   PO intake, Supplement acceptance, Labs, Skin, Weight trends, I & O's  ASSESSMENT:   63 y/o M who presented to [facility] 1/3 with fever, cough, chest pain & SOB. Pt found to be Influenza A positive. Developed progressive respiratory distress requiring intubation in the ER.    3/6 - trach capped, MBS with recommendations for Dysphagia 2, honey-thick liquids and TF held 3/9 - decannulated, advanced to Dysphagia 3 with honey-thick liquids 3/10 - NPO due to concern for aspiration 3/11 - MBS with recommendations for Dysphagia 2, honey-thick liquids  Spoke with Jacki Cones, Charity fundraiser. Osmolite 1.2 continues to infuse at 45 ml/hr via PEG tube. Pt is eating well; will D/C his supplemental tube feeding at this time.  Pt was noncompliant with Dys 2-honey thick diet. SLP has signed off; now on a regular, thin liquid diet. He is receiving hydrotherapy for sacral wound per PT.  Labs & medications reviewed. CBG's [id number].  Diet Order:   Diet Order            Diet Carb Modified Fluid consistency: Thin; Room service appropriate? 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  Estimated Nutritional  Needs:   Kcal:  2200-2400  Protein:  120-135 gm  Fluid:  > 2 L/day  Maureen Chatters, RD, LDN Pager #: [phone] After-Hours Pager #: [phone]

## 2019-02-12 NOTE — Progress Notes (Signed)
Physical Therapy Wound Treatment Patient Details  Name: Antonio [name]. MRN: [id number] Date of Birth: 1956/09/30  Today's Date: 02/12/2019 Time: 1023-1052 Time Calculation (min): 29 min  Subjective  Subjective: OK, OK Patient and Family Stated Goals: "go home." Prior Treatments: dressing changes  Pain Score: Pain Score: 5   Wound Assessment  Pressure Injury 12/01/18 Unstageable - Full thickness tissue loss in which the base of the ulcer is covered by slough (yellow, tan, gray, green or brown) and/or eschar (tan, brown or black) in the wound bed. sacrum (Active)  Wound Image   02/10/2019 11:27 AM  Dressing Type Gauze (Comment);Moist to moist;ABD;Barrier Film (skin prep);Other (Comment);Impregnated gauze (petrolatum) 02/12/2019 10:52 AM  Dressing Clean;Dry;Intact 02/12/2019 10:52 AM  Dressing Change Frequency Twice a day 02/12/2019 10:52 AM  State of Healing Early/partial granulation 02/12/2019 10:52 AM  Site / Wound Assessment Red;Yellow;Pink 02/12/2019 10:52 AM  % Wound base Red or Granulating 55% 02/12/2019 10:52 AM  % Wound base Yellow/Fibrinous Exudate 45% 02/12/2019 10:52 AM  % Wound base Black/Eschar 0% 02/12/2019 10:52 AM  % Wound base Other/Granulation Tissue (Comment) 0% 02/12/2019 10:52 AM  Peri-wound Assessment Erythema (blanchable);Pink 02/12/2019 10:52 AM  Wound Length (cm) 7 cm 02/10/2019 11:27 AM  Wound Width (cm) 5 cm 02/10/2019 11:27 AM  Wound Depth (cm) 4 cm 02/10/2019 11:27 AM  Wound Surface Area (cm^2) 35 cm^2 02/10/2019 11:27 AM  Wound Volume (cm^3) 140 cm^3 02/10/2019 11:27 AM  Tunneling (cm) 4.5 02/10/2019 11:27 AM  Margins Unattached edges (unapproximated) 02/12/2019 10:52 AM  Drainage Amount Moderate 02/12/2019 10:52 AM  Drainage Description Purulent;Green;Odor 02/12/2019 10:52 AM  Treatment Hydrotherapy (Pulse lavage);Packing (Saline gauze);Debridement (Selective) 02/12/2019 10:52 AM    Dakins solution added to dressing    Hydrotherapy Pulsed lavage therapy - wound  location: sacrum Pulsed Lavage with Suction (psi): 12 psi Pulsed Lavage with Suction - Normal Saline Used: 1000 mL Pulsed Lavage Tip: Tip with splash shield Selective Debridement Selective Debridement - Location: sacrum   Wound Assessment and Plan  Wound Therapy - Assess/Plan/Recommendations Wound Therapy - Clinical Statement: Purulent drainage still present.  Dakin's initiated today.  Black eschar no longer present.   Wound Therapy - Functional Problem List: decr mobility Factors Delaying/Impairing Wound Healing: Diabetes Mellitus;Immobility;Multiple medical problems Hydrotherapy Plan: Debridement;Dressing change;Patient/family education;Pulsatile lavage with suction Wound Therapy - Frequency: 6X / week Wound Therapy - Follow Up Recommendations: Skilled nursing facility Wound Plan: see above  Wound Therapy Goals- Improve the function of patient's integumentary system by progressing the wound(s) through the phases of wound healing (inflammation - proliferation - remodeling) by: Decrease Necrotic Tissue to: 40 Decrease Necrotic Tissue - Progress: Progressing toward goal Increase Granulation Tissue to: 60 Increase Granulation Tissue - Progress: Progressing toward goal  Goals will be updated until maximal potential achieved or discharge criteria met.  Discharge criteria: when goals achieved, discharge from hospital, MD decision/surgical intervention, no progress towards goals, refusal/missing three consecutive treatments without notification or medical reason.  [name] 02/12/2019, 10:59 AM

## 2019-02-12 NOTE — Progress Notes (Addendum)
Subjective: CC: Sacral pain Complains of pain in his sacrum  Objective: Vital signs in last 24 hours: Temp:  [98.4 F (36.9 C)-99.2 F (37.3 C)] 98.4 F (36.9 C) (03/19 0559) Pulse Rate:  [73-82] 73 (03/19 0559) Resp:  [16-18] 16 (03/19 0559) BP: (112-134)/(56-71) 134/66 (03/19 0559) SpO2:  [89 %-97 %] 97 % (03/19 0559) Weight:  [87 kg] 87 kg (03/19 0542) Last BM Date: 02/11/19  Intake/Output from previous day: 03/18 0701 - 03/19 0700 In: 495 [NG/GT:495] Out: 1650 [Urine:1650] Intake/Output this shift: Total I/O In: -  Out: 260 [Urine:260]  PE: Gen:  Alert, NAD, pleasant HEENT: EOM's intact, pupils equal and round Pulm: effort normal GU: sacral wound with significant amount of slough, wound tunnels about 7cm distally (left). 2cm opening. Wound edges appear cleaner. Copious drainage and some pus noted on dressing       Lab Results:  Recent Labs    02/11/19 0320  WBC 7.1  HGB 10.9*  HCT 36.0*  PLT 271   BMET Recent Labs    02/11/19 0320  NA 136  K 4.5  CL 97*  CO2 34*  GLUCOSE 180*  BUN 16  CREATININE 0.53*  CALCIUM 8.8*   PT/INR No results for input(s): LABPROT, INR in the last 72 hours. CMP     Component Value Date/Time   NA 136 02/11/2019 0320   K 4.5 02/11/2019 0320   CL 97 (L) 02/11/2019 0320   CO2 34 (H) 02/11/2019 0320   GLUCOSE 180 (H) 02/11/2019 0320   BUN 16 02/11/2019 0320   CREATININE 0.53 (L) 02/11/2019 0320   CALCIUM 8.8 (L) 02/11/2019 0320   PROT 5.6 (L) 02/09/2019 0502   ALBUMIN 3.0 (L) 02/09/2019 0502   AST 19 02/09/2019 0502   ALT 8 02/09/2019 0502   ALKPHOS 108 02/09/2019 0502   BILITOT 1.7 (H) 02/09/2019 0502   GFRNONAA >60 02/11/2019 0320   GFRAA >60 02/11/2019 0320   Lipase     Component Value Date/Time   LIPASE 24 11/30/2018 0244       Studies/Results: No results found.  Anti-infectives: Anti-infectives (From admission, onward)   Start     Dose/Rate Route Frequency Ordered Stop   02/04/19  1300  vancomycin (VANCOCIN) 1,250 mg in sodium chloride 0.9 % 250 mL IVPB  Status:  Discontinued     1,250 mg 166.7 mL/hr over 90 Minutes Intravenous Every 12 hours 02/04/19 1200 02/06/19 1002   02/04/19 1200  metroNIDAZOLE (FLAGYL) IVPB 500 mg  Status:  Discontinued     500 mg 100 mL/hr over 60 Minutes Intravenous Every 8 hours 02/04/19 1146 02/06/19 1002   02/04/19 0930  ceFEPIme (MAXIPIME) 2 g in sodium chloride 0.9 % 100 mL IVPB     2 g 200 mL/hr over 30 Minutes Intravenous Every 8 hours 02/04/19 0926 02/08/19 2359   02/03/19 1200  meropenem (MERREM) 1 g in sodium chloride 0.9 % 100 mL IVPB  Status:  Discontinued     1 g 200 mL/hr over 30 Minutes Intravenous Every 8 hours 02/03/19 1054 02/04/19 0924   01/19/19 1400  cefTAZidime (FORTAZ) 2 g in sodium chloride 0.9 % 100 mL IVPB     2 g 200 mL/hr over 30 Minutes Intravenous Every 8 hours 01/19/19 1120 01/24/19 2359   01/15/19 1500  cefTAZidime (FORTAZ) 2 g in sodium chloride 0.9 % 100 mL IVPB     2 g 200 mL/hr over 30 Minutes Intravenous Every 8 hours 01/15/19  1448 01/19/19 0542   01/14/19 1400  colistimethate ((COLYMYCIN-M)) nebulized solution 150 mg  Status:  Discontinued     150 mg Inhalation 3 times daily 01/14/19 1142 01/19/19 1119   01/14/19 1300  meropenem (MERREM) 1 g in sodium chloride 0.9 % 100 mL IVPB  Status:  Discontinued     1 g 200 mL/hr over 30 Minutes Intravenous Every 8 hours 01/14/19 1235 01/15/19 1448   12/23/18 1506  vancomycin (VANCOCIN) 1-5 GM/200ML-% IVPB    Note to Pharmacy:  [facility]   : cabinet override      12/23/18 1506 12/23/18 1515   12/20/18 2200  vancomycin (VANCOCIN) IVPB 1000 mg/200 mL premix  Status:  Discontinued     1,000 mg 200 mL/hr over 60 Minutes Intravenous Every 12 hours 12/20/18 1804 12/22/18 1413   12/18/18 0800  ceFEPIme (MAXIPIME) 1 g in sodium chloride 0.9 % 100 mL IVPB  Status:  Discontinued     1 g 200 mL/hr over 30 Minutes Intravenous Every 8 hours 12/18/18 0713 12/19/18 1201    12/17/18 0530  vancomycin (VANCOCIN) 1,500 mg in sodium chloride 0.9 % 500 mL IVPB  Status:  Discontinued     1,500 mg 250 mL/hr over 120 Minutes Intravenous Every 12 hours 12/16/18 1704 12/20/18 1804   12/16/18 1730  cefTAZidime (FORTAZ) 2 g in sodium chloride 0.9 % 100 mL IVPB  Status:  Discontinued     2 g 200 mL/hr over 30 Minutes Intravenous Every 8 hours 12/16/18 1704 12/18/18 0702   12/16/18 1730  vancomycin (VANCOCIN) 2,000 mg in sodium chloride 0.9 % 500 mL IVPB     2,000 mg 250 mL/hr over 120 Minutes Intravenous  Once 12/16/18 1704 12/16/18 2030   12/07/18 1300  erythromycin 500 mg in sodium chloride 0.9 % 100 mL IVPB  Status:  Discontinued     500 mg 100 mL/hr over 60 Minutes Intravenous Every 6 hours 12/07/18 1118 12/16/18 1627   12/06/18 1500  vancomycin (VANCOCIN) 1,250 mg in sodium chloride 0.9 % 250 mL IVPB  Status:  Discontinued     1,250 mg 166.7 mL/hr over 90 Minutes Intravenous Every 12 hours 12/06/18 1436 12/07/18 1118   12/06/18 0100  vancomycin (VANCOCIN) 1,250 mg in sodium chloride 0.9 % 250 mL IVPB     1,250 mg 166.7 mL/hr over 90 Minutes Intravenous  Once 12/05/18 1054 12/06/18 0146   12/05/18 1200  vancomycin (VANCOCIN) 2,000 mg in sodium chloride 0.9 % 500 mL IVPB     2,000 mg 250 mL/hr over 120 Minutes Intravenous  Once 12/05/18 1054 12/05/18 1350   12/05/18 1200  cefTAZidime (FORTAZ) 1 g in sodium chloride 0.9 % 100 mL IVPB  Status:  Discontinued     1 g 200 mL/hr over 30 Minutes Intravenous Every 8 hours 12/05/18 1106 12/07/18 1118   11/30/18 2200  oseltamivir (TAMIFLU) 6 MG/ML suspension 30 mg     30 mg Per Tube 2 times daily 11/30/18 1348 12/03/18 2159   11/30/18 2200  ceFEPIme (MAXIPIME) 1 g in sodium chloride 0.9 % 100 mL IVPB  Status:  Discontinued     1 g 200 mL/hr over 30 Minutes Intravenous Every 12 hours 11/30/18 1350 12/01/18 1039   11/29/18 2200  ceFEPIme (MAXIPIME) 2 g in sodium chloride 0.9 % 100 mL IVPB  Status:  Discontinued     2 g 200  mL/hr over 30 Minutes Intravenous Every 12 hours 11/29/18 1645 11/30/18 1350   11/29/18 1100  oseltamivir (TAMIFLU)  6 MG/ML suspension 75 mg  Status:  Discontinued     75 mg Per Tube 2 times daily 11/29/18 1000 11/30/18 1348   11/28/18 2300  oseltamivir (TAMIFLU) capsule 75 mg  Status:  Discontinued     75 mg Oral 2 times daily 11/28/18 2238 11/29/18 1000   11/28/18 2030  oseltamivir (TAMIFLU) capsule 75 mg     75 mg Oral  Once 11/28/18 2029 11/28/18 2207   11/28/18 1745  vancomycin (VANCOCIN) 2,000 mg in sodium chloride 0.9 % 500 mL IVPB     2,000 mg 250 mL/hr over 120 Minutes Intravenous  Once 11/28/18 1741 11/28/18 2203   11/28/18 1745  piperacillin-tazobactam (ZOSYN) IVPB 3.375 g     3.375 g 100 mL/hr over 30 Minutes Intravenous  Once 11/28/18 1741 11/28/18 1834       Assessment/Plan HTN CHF DM S/p tx for Influenza A S/p tracheostomy  S/p tx for HCAP Deconditioning  Sacral Wound - MRI negative for osteomyelitisof areas evaluated - hydrotherapy, [place] WTD dressing changes BID - Turn patient q2hrs  FEN -TF's, carb modified VTE -Lovenox ID -not currently on any abx  Plan: Continue hydrotherapy and wet to dry/santyl dressing changes while inpatient. Will add dakin's BID for 3 days. No fever or leukocytosis. This does not appear to be causing him to become systemically ill at this time. No indication for surgical debridement. He can be discharged from surgical standpoint.     LOS: 76 days    Jacinto Halim , [facility] 02/12/2019, 9:47 AM Pager: [phone]

## 2019-02-12 NOTE — Progress Notes (Signed)
PT Cancellation Note  Patient Details Name: Antonio [name]. MRN: [id number] DOB: May 27, 1956   Cancelled Treatment:    Reason Eval/Treat Not Completed: (P) Patient declined, no reason specified(Pt reports, "I don't want to f*^$%^# do therapy today." Will follow up per POC.)  Margarita Mail, SPTA  Margarita Mail 02/12/2019, 4:01 PM

## 2019-02-12 NOTE — Progress Notes (Signed)
OT Cancellation Note  Patient Details Name: Antonio [name]. MRN: [id number] DOB: 1956-06-06   Cancelled Treatment:    Reason Eval/Treat Not Completed: Patient declined, no reason specified Pt refused due to hydrotherapy today and in increased pain at this time.  Revonda Standard Cecil Cranker) Glendell Docker OTR/L Acute Rehabilitation Services Pager: [phone] Office: [phone]   Sandrea Hughs 02/12/2019, 4:23 PM

## 2019-02-12 NOTE — Progress Notes (Signed)
PROGRESS NOTE        PATIENT DETAILS Name: Antonio [name]. Age: 63 y.o. Sex: male Date of Birth: November 01, 1956 Admit Date: 11/28/2018 Admitting Physician Reyes Ivan, MD YNW:GNFA, Orie Rout, NP  Brief Narrative: Patient is a 63 y.o. male admitted on 1/3 with influenza A causing acute hypoxic respiratory failure, cardiogenic shock-required prolonged mechanical ventilation-is currently s/p tracheostomy and PEG tube placement.  Significant events: 1/03  Admit, Influenza A positive 1/06  Self extubated  1/07  Re-intubated  1/08  Self-extubated  1/16 tracheostomy 1/21  Trach revision to XLT distal 1/28 PEG tube 2/03  Weaned 15 minutes  2/11  ATC  2/14  Trach changed to #4, mucus plugging >back to #5 XLT 2/15  Failed ATC with increased WOB 2/16  Failed ATC with increased WOB  2/21 back on pressure support 2/24 back on TC  01/27/19 - 5 XLT cuffless trach  01/29/2019: Size 4 cuff less tracheostomy placed 01/30/2019: Capping trials initiated 02/02/2019: Tracheostomy decannulated  Subjective: Lying comfortably in bed-no chest pain or shortness of breath.  Assessment/Plan: Fever: Afebrile over the past few days-chest x-ray without any infiltrate, blood culture and urine culture on 3/11 negative.  Currently off all antimicrobial therapy-continue to monitor.  Stage IV sacral/coccygeal ulceration: Continue with hydrotherapy while inpatient-General surgery will follow today and provide further recommendations.  If hydrotherapy not felt to be required anymore-patient will be discharged to SNF.    Acute hypoxic respiratory failure secondary to influenza A, pseudomonal/Corynebacterium pneumonia: Prolonged hospitalization requiring prolonged ventilator support-subsequently tracheostomy on 1/16.  Currently decannulated on 3/9-Per last PCCM note-if the wound does not close in 2 weeks-may need evaluation for tracheocutaneous fistula.  Acute systolic heart failure (EF  20-25% on 1/4, EF 60-65% by TTE on 1/29): Compensated-continue beta-blocker.  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  Cardiology recommending further work-up to be done in the outpatient setting.  Dysphagia: Speech therapy following-continue dysphagia 2 diet.  Metabolic encephalopathy: Resolved.  Etiology felt to be secondary to hypoxia/pneumonia.  DM-2: CBG stable continue Levemir 25 units twice daily and SSI.  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  DVT Prophylaxis: Prophylactic Lovenox   Code Status: Full code  Family Communication: None at bedside  Disposition Plan: Remain inpatient-SNF on discharge  Antimicrobial agents: Anti-infectives (From admission, onward)   Start     Dose/Rate Route Frequency Ordered Stop   02/04/19 1300  vancomycin (VANCOCIN) 1,250 mg in sodium chloride 0.9 % 250 mL IVPB  Status:  Discontinued     1,250 mg 166.7 mL/hr over 90 Minutes Intravenous Every 12 hours 02/04/19 1200 02/06/19 1002   02/04/19 1200  metroNIDAZOLE (FLAGYL) IVPB 500 mg  Status:  Discontinued     500 mg 100 mL/hr over 60 Minutes Intravenous Every 8 hours 02/04/19 1146 02/06/19 1002   02/04/19 0930  ceFEPIme (MAXIPIME) 2 g in sodium chloride 0.9 % 100 mL IVPB     2 g 200 mL/hr over 30 Minutes Intravenous Every 8 hours 02/04/19 0926 02/08/19 2359   02/03/19 1200  meropenem (MERREM) 1 g in sodium chloride 0.9 % 100 mL IVPB  Status:  Discontinued     1 g 200 mL/hr over 30 Minutes Intravenous Every 8 hours 02/03/19  1054 02/04/19 0924   01/19/19 1400  cefTAZidime (FORTAZ) 2 g in sodium chloride 0.9 % 100 mL IVPB     2 g 200 mL/hr over 30 Minutes Intravenous Every 8 hours 01/19/19 1120 01/24/19 2359   01/15/19 1500  cefTAZidime (FORTAZ) 2 g in sodium chloride 0.9 % 100 mL IVPB     2 g 200 mL/hr over 30 Minutes  Intravenous Every 8 hours 01/15/19 1448 01/19/19 0542   01/14/19 1400  colistimethate ((COLYMYCIN-M)) nebulized solution 150 mg  Status:  Discontinued     150 mg Inhalation 3 times daily 01/14/19 1142 01/19/19 1119   01/14/19 1300  meropenem (MERREM) 1 g in sodium chloride 0.9 % 100 mL IVPB  Status:  Discontinued     1 g 200 mL/hr over 30 Minutes Intravenous Every 8 hours 01/14/19 1235 01/15/19 1448   12/23/18 1506  vancomycin (VANCOCIN) 1-5 GM/200ML-% IVPB    Note to Pharmacy:  [facility]   : cabinet override      12/23/18 1506 12/23/18 1515   12/20/18 2200  vancomycin (VANCOCIN) IVPB 1000 mg/200 mL premix  Status:  Discontinued     1,000 mg 200 mL/hr over 60 Minutes Intravenous Every 12 hours 12/20/18 1804 12/22/18 1413   12/18/18 0800  ceFEPIme (MAXIPIME) 1 g in sodium chloride 0.9 % 100 mL IVPB  Status:  Discontinued     1 g 200 mL/hr over 30 Minutes Intravenous Every 8 hours 12/18/18 0713 12/19/18 1201   12/17/18 0530  vancomycin (VANCOCIN) 1,500 mg in sodium chloride 0.9 % 500 mL IVPB  Status:  Discontinued     1,500 mg 250 mL/hr over 120 Minutes Intravenous Every 12 hours 12/16/18 1704 12/20/18 1804   12/16/18 1730  cefTAZidime (FORTAZ) 2 g in sodium chloride 0.9 % 100 mL IVPB  Status:  Discontinued     2 g 200 mL/hr over 30 Minutes Intravenous Every 8 hours 12/16/18 1704 12/18/18 0702   12/16/18 1730  vancomycin (VANCOCIN) 2,000 mg in sodium chloride 0.9 % 500 mL IVPB     2,000 mg 250 mL/hr over 120 Minutes Intravenous  Once 12/16/18 1704 12/16/18 2030   12/07/18 1300  erythromycin 500 mg in sodium chloride 0.9 % 100 mL IVPB  Status:  Discontinued     500 mg 100 mL/hr over 60 Minutes Intravenous Every 6 hours 12/07/18 1118 12/16/18 1627   12/06/18 1500  vancomycin (VANCOCIN) 1,250 mg in sodium chloride 0.9 % 250 mL IVPB  Status:  Discontinued     1,250 mg 166.7 mL/hr over 90 Minutes Intravenous Every 12 hours 12/06/18 1436 12/07/18 1118   12/06/18 0100  vancomycin  (VANCOCIN) 1,250 mg in sodium chloride 0.9 % 250 mL IVPB     1,250 mg 166.7 mL/hr over 90 Minutes Intravenous  Once 12/05/18 1054 12/06/18 0146   12/05/18 1200  vancomycin (VANCOCIN) 2,000 mg in sodium chloride 0.9 % 500 mL IVPB     2,000 mg 250 mL/hr over 120 Minutes Intravenous  Once 12/05/18 1054 12/05/18 1350   12/05/18 1200  cefTAZidime (FORTAZ) 1 g in sodium chloride 0.9 % 100 mL IVPB  Status:  Discontinued     1 g 200 mL/hr over 30 Minutes Intravenous Every 8 hours 12/05/18 1106 12/07/18 1118   11/30/18 2200  oseltamivir (TAMIFLU) 6 MG/ML suspension 30 mg     30 mg Per Tube 2 times daily 11/30/18 1348 12/03/18 2159   11/30/18 2200  ceFEPIme (MAXIPIME) 1 g in sodium chloride 0.9 % 100  mL IVPB  Status:  Discontinued     1 g 200 mL/hr over 30 Minutes Intravenous Every 12 hours 11/30/18 1350 12/01/18 1039   11/29/18 2200  ceFEPIme (MAXIPIME) 2 g in sodium chloride 0.9 % 100 mL IVPB  Status:  Discontinued     2 g 200 mL/hr over 30 Minutes Intravenous Every 12 hours 11/29/18 1645 11/30/18 1350   11/29/18 1100  oseltamivir (TAMIFLU) 6 MG/ML suspension 75 mg  Status:  Discontinued     75 mg Per Tube 2 times daily 11/29/18 1000 11/30/18 1348   11/28/18 2300  oseltamivir (TAMIFLU) capsule 75 mg  Status:  Discontinued     75 mg Oral 2 times daily 11/28/18 2238 11/29/18 1000   11/28/18 2030  oseltamivir (TAMIFLU) capsule 75 mg     75 mg Oral  Once 11/28/18 2029 11/28/18 2207   11/28/18 1745  vancomycin (VANCOCIN) 2,000 mg in sodium chloride 0.9 % 500 mL IVPB     2,000 mg 250 mL/hr over 120 Minutes Intravenous  Once 11/28/18 1741 11/28/18 2203   11/28/18 1745  piperacillin-tazobactam (ZOSYN) IVPB 3.375 g     3.375 g 100 mL/hr over 30 Minutes Intravenous  Once 11/28/18 1741 11/28/18 1834      Procedures: See above  CONSULTS:  cardiology, pulmonary/intensive care and general surgery  Time spent: 15 minutes-Greater than 50% of this time was spent in counseling, explanation of diagnosis,  planning of further management, and coordination of care.  MEDICATIONS: Scheduled Meds:  arformoterol  15 mcg Nebulization BID   aspirin  81 mg Per Tube Daily   atorvastatin  40 mg Per Tube q1800   budesonide (PULMICORT) nebulizer solution  0.5 mg Nebulization BID   carvedilol  3.125 mg Per Tube BID WC   [START ON 02/15/2019] collagenase   Topical Daily   enoxaparin (LOVENOX) injection  40 mg Subcutaneous Q24H   famotidine  20 mg Per Tube BID   feeding supplement (PRO-STAT SUGAR FREE 64)  30 mL Oral BID   folic acid  1 mg Per Tube Daily   guaiFENesin  5 mL Per Tube Q12H   insulin aspart  0-20 Units Subcutaneous TID WC   insulin detemir  25 Units Subcutaneous BID   multivitamin with minerals  1 tablet Oral Daily   polyethylene glycol  17 g Per Tube Daily   sodium hypochlorite   Irrigation Daily   tapentadol  50 mg Oral QID   thiamine  100 mg Per Tube Daily   Continuous Infusions:  sodium chloride 10 mL/hr at 01/24/19 2000   PRN Meds:.sodium chloride, acetaminophen (TYLENOL) oral liquid 160 mg/5 mL, albuterol, alum & mag hydroxide-simeth **AND** lidocaine, bisacodyl, clonazePAM, docusate, ipratropium-albuterol, oxyCODONE, Resource ThickenUp Clear, senna   PHYSICAL EXAM: Vital signs: Vitals:   02/11/19 2222 02/12/19 0542 02/12/19 0559 02/12/19 1428  BP: 126/70  134/66 (!) 126/58  Pulse: 77  73 85  Resp: 18  16 18   Temp: 99.2 F (37.3 C)  98.4 F (36.9 C) 98.7 F (37.1 C)  TempSrc: Oral  Oral Oral  SpO2: 96%  97% 92%  Weight:  87 kg    Height:       Filed Weights   02/04/19 0547 02/11/19 0547 02/12/19 0542  Weight: 81.2 kg 81.2 kg 87 kg   Body mass index is 26.01 kg/m.   General appearance :Awake, alert, not in any distress.  HEENT: Atraumatic and Normocephalic Resp:Good air entry bilaterally, no added sounds  CVS: S1 S2 regular, no  murmurs.  GI: Bowel sounds present, Non tender and not distended with no gaurding, rigidity or rebound.No  organomegaly Extremities: Left AKA Neurology: Nonfocal Musculoskeletal:No digital cyanosis Skin:No Rash, warm and dry  I have personally reviewed following labs and imaging studies  LABORATORY DATA: CBC: Recent Labs  Lab 02/06/19 0413 02/07/19 0549 02/09/19 0502 02/11/19 0320  WBC 9.0 7.4 4.8 7.1  NEUTROABS 6.6 5.2 3.2 3.9  HGB 9.2* 9.4* 9.0* 10.9*  HCT 29.8* 31.2* 31.6* 36.0*  MCV 85.4 86.7 91.3 85.7  PLT 170 176 224 271    Basic Metabolic Panel: Recent Labs  Lab 02/06/19 0413 02/07/19 0549 02/09/19 0502 02/11/19 0320  NA 133* 134* 141 136  K 3.9 3.9 3.1* 4.5  CL 98 98 100 97*  CO2 27 26 31  34*  GLUCOSE 175* 166* 84 180*  BUN 17 13 14 16   CREATININE 0.47* 0.42* 1.11 0.53*  CALCIUM 8.1* 8.6* 9.6 8.8*  MG 1.8 1.8 2.4 1.7    GFR: Estimated Creatinine Clearance: 106.4 mL/min (A) (by C-G formula based on SCr of 0.53 mg/dL (L)).  Liver Function Tests: Recent Labs  Lab 02/09/19 0502  AST 19  ALT 8  ALKPHOS 108  BILITOT 1.7*  PROT 5.6*  ALBUMIN 3.0*   No results for input(s): LIPASE, AMYLASE in the last 168 hours. No results for input(s): AMMONIA in the last 168 hours.  Coagulation Profile: No results for input(s): INR, PROTIME in the last 168 hours.  Cardiac Enzymes: No results for input(s): CKTOTAL, CKMB, CKMBINDEX, TROPONINI in the last 168 hours.  BNP (last 3 results) No results for input(s): PROBNP in the last 8760 hours.  HbA1C: No results for input(s): HGBA1C in the last 72 hours.  CBG: Recent Labs  Lab 02/11/19 1202 02/11/19 1704 02/11/19 2126 02/12/19 0749 02/12/19 1301  GLUCAP 146* 149* 154* 111* 171*    Lipid Profile: No results for input(s): CHOL, HDL, LDLCALC, TRIG, CHOLHDL, LDLDIRECT in the last 72 hours.  Thyroid Function Tests: No results for input(s): TSH, T4TOTAL, FREET4, T3FREE, THYROIDAB in the last 72 hours.  Anemia Panel: No results for input(s): VITAMINB12, FOLATE, FERRITIN, TIBC, IRON, RETICCTPCT in the last 72  hours.  Urine analysis:    Component Value Date/Time   COLORURINE YELLOW 02/04/2019 1400   APPEARANCEUR HAZY (A) 02/04/2019 1400   LABSPEC 1.028 02/04/2019 1400   PHURINE 5.0 02/04/2019 1400   GLUCOSEU 150 (A) 02/04/2019 1400   HGBUR NEGATIVE 02/04/2019 1400   BILIRUBINUR NEGATIVE 02/04/2019 1400   KETONESUR NEGATIVE 02/04/2019 1400   PROTEINUR 100 (A) 02/04/2019 1400   NITRITE NEGATIVE 02/04/2019 1400   LEUKOCYTESUR NEGATIVE 02/04/2019 1400    Sepsis Labs: Lactic Acid, Venous    Component Value Date/Time   LATICACIDVEN 1.3 01/09/2019 0620    MICROBIOLOGY: Recent Results (from the past 240 hour(s))  Expectorated sputum assessment w rflx to resp cult     Status: None   Collection Time: 02/04/19 10:44 AM  Result Value Ref Range Status   Specimen Description SPUTUM  Final   Special Requests NONE  Final   Sputum evaluation   Final    THIS SPECIMEN IS ACCEPTABLE FOR SPUTUM CULTURE Performed at [facility], [address] [place], [place] [address]    Report Status 02/04/2019 FINAL  Final  Culture, respiratory     Status: None   Collection Time: 02/04/19 10:44 AM  Result Value Ref Range Status   Specimen Description SPUTUM  Final   Special Requests NONE Reflexed from  [id number]  Final   Gram Stain   Final    ABUNDANT WBC PRESENT, PREDOMINANTLY PMN RARE SQUAMOUS EPITHELIAL CELLS PRESENT RARE GRAM POSITIVE RODS Performed at [facility], [address], [place], [place] [address]    Culture FEW PSEUDOMONAS AERUGINOSA  Final   Report Status 02/07/2019 FINAL  Final   Organism ID, Bacteria PSEUDOMONAS AERUGINOSA  Final      Susceptibility   Pseudomonas aeruginosa - MIC*    CEFTAZIDIME 4 SENSITIVE Sensitive     CIPROFLOXACIN <=0.25 SENSITIVE Sensitive     GENTAMICIN <=1 SENSITIVE Sensitive     IMIPENEM 2 SENSITIVE Sensitive     PIP/TAZO 16 SENSITIVE Sensitive     CEFEPIME 4 SENSITIVE Sensitive     * FEW PSEUDOMONAS AERUGINOSA  Culture, blood (routine x 2)      Status: None   Collection Time: 02/04/19 11:52 AM  Result Value Ref Range Status   Specimen Description BLOOD LEFT ARM  Final   Special Requests   Final    BOTTLES DRAWN AEROBIC ONLY Blood Culture adequate volume   Culture   Final    NO GROWTH 5 DAYS Performed at [facility], [address], [place], [place] [address]    Report Status 02/09/2019 FINAL  Final  Culture, blood (routine x 2)     Status: None   Collection Time: 02/04/19 12:00 PM  Result Value Ref Range Status   Specimen Description BLOOD LEFT ARM  Final   Special Requests   Final    BOTTLES DRAWN AEROBIC ONLY Blood Culture adequate volume   Culture   Final    NO GROWTH 5 DAYS Performed at [facility], [address] [place], [place] [address]    Report Status 02/09/2019 FINAL  Final  Culture, Urine     Status: None   Collection Time: 02/04/19  1:54 PM  Result Value Ref Range Status   Specimen Description URINE, CLEAN CATCH  Final   Special Requests NONE  Final   Culture   Final    NO GROWTH Performed at [facility], [address], [place], [place] [address]    Report Status 02/05/2019 FINAL  Final    RADIOLOGY STUDIES/RESULTS: Mr Lumbar Spine W Wo Contrast  Result Date: 02/04/2019 CLINICAL DATA:  Coccygeal wound infection. EXAM: MRI LUMBAR SPINE WITHOUT AND WITH CONTRAST TECHNIQUE: Multiplanar and multiecho pulse sequences of the lumbar spine were obtained without and with intravenous contrast. CONTRAST:  7 mL Gadavist COMPARISON:  None. FINDINGS: Segmentation: Normal. The lowest disc space is considered to be L5-S1. Alignment:  Normal Vertebrae: There is no bone marrow edema. No compression fracture. No abnormal contrast enhancement. Normal T1-weighted signal is preserved throughout the visualized spine and sacrum. The coccyx is outside of the field of view. Conus medullaris and cauda equina: The conus medullaris terminates at the L1 level. The cauda equina and conus medullaris are both normal.  Paraspinal and other soft tissues: The visualized retroperitoneal organs and paraspinal soft tissues are normal. Disc levels: Sagittal plane imaging includes the T12-L1 disc level through the upper sacrum, with axial imaging of the L1-2 to L5-S1 disc levels. T12-L1: Normal. L1-2: Normal. L2-3: Disc desiccation with small bulge and superimposed central protrusion. No central spinal canal stenosis. No neural foraminal stenosis. Normal facets. L3-4: Disc desiccation and small central protrusion. No spinal canal or neural foraminal stenosis. L4-5: Minimal disc bulge no spinal canal or neural foraminal stenosis. L5-S1: Central/right subarticular disc protrusion  with narrowing of the right lateral recess. No central spinal canal stenosis or neural foraminal stenosis. The visualized portion of the sacrum is normal. IMPRESSION: 1. No evidence of active osteomyelitis of the lumbar spine or sacrum. Please note that the coccyx is outside the field of view. 2. No fluid collection or other focal abnormality of the soft tissues dorsal to the spine. 3. Multilevel mild degenerative disc disease. Mild narrowing of the right L5-S1 lateral recess could cause radicular symptoms in a right S1 distribution. Electronically Signed   By: Deatra Robinson M.D.   On: 02/04/2019 23:06   Dg Chest Port 1 View  Result Date: 02/04/2019 CLINICAL DATA:  Dyspnea. EXAM: PORTABLE CHEST 1 VIEW COMPARISON:  One-view chest x-ray 02/03/2019 FINDINGS: The heart is mildly enlarged. Mild pulmonary vascular congestion is stable. Elevation of the right hemidiaphragm is again noted. There is some volume loss on the right with associated atelectasis. No significant airspace consolidation is present. A small right effusion is not excluded. IMPRESSION: 1. Mild pulmonary vascular congestion without frank edema. 2. Similar appearance of right basilar atelectasis. Electronically Signed   By: Marin Roberts M.D.   On: 02/04/2019 08:31   Dg Chest Port 1  View  Result Date: 02/03/2019 CLINICAL DATA:  Shortness of breath EXAM: PORTABLE CHEST 1 VIEW COMPARISON:  01/19/2019 FINDINGS: Cardiac shadows within normal limits. Elevation of the right hemidiaphragm is noted with right basilar atelectasis. No other focal infiltrate is seen. Gastrostomy tube is noted within the stomach. IMPRESSION: Mild right basilar atelectasis. Electronically Signed   By: Alcide Clever M.D.   On: 02/03/2019 08:24   Dg Chest Port 1 View  Result Date: 01/19/2019 CLINICAL DATA:  Acute respiratory failure EXAM: PORTABLE CHEST 1 VIEW COMPARISON:  01/15/2019 FINDINGS: Cardiac shadow is stable. Left lung remains clear. Endotracheal tube is noted in satisfactory position. Right basilar atelectasis is noted increased from the prior exam. No pneumothorax is seen. No acute bony abnormality is noted. IMPRESSION: Increasing right basilar atelectasis. Electronically Signed   By: Alcide Clever M.D.   On: 01/19/2019 07:23   Dg Chest Port 1 View  Result Date: 01/15/2019 CLINICAL DATA:  Pulmonary insufficiency. SOB. EXAM: PORTABLE CHEST 1 VIEW COMPARISON:  01/12/2019. FINDINGS: Unchanged cardiomediastinal silhouette. Mild increased perihilar markings without consolidation or edema. No bony abnormality. Unchanged tracheostomy. No acute osseous findings. IMPRESSION: Improved aeration.  Mild increased perihilar markings Electronically Signed   By: Elsie Stain M.D.   On: 01/15/2019 07:06   Dg Swallowing Func-speech Pathology  Result Date: 02/04/2019 Objective Swallowing Evaluation: Type of Study: MBS-Modified Barium Swallow Study  Patient Details Name: Ebrahim Deremer. MRN: [id number] Date of Birth: 03/27/1956 Today's Date: 02/04/2019 Time: SLP Start Time (ACUTE ONLY): 0920 -SLP Stop Time (ACUTE ONLY): 0931 SLP Time Calculation (min) (ACUTE ONLY): 11 min Past Medical History: Past Medical History: Diagnosis Date  Coronary artery disease   Diabetes mellitus without complication (HCC)    Hyperlipidemia   Hypertension   Osteomyelitis (HCC) 03/25/2017  RT FOOT Past Surgical History: Past Surgical History: Procedure Laterality Date  AMPUTATION Right 03/27/2017  Procedure: 1st Ray Amputation Right Foot;  Surgeon: Nadara Mustard, MD;  Location: [facility] OR;  Service: Orthopedics;  Laterality: Right;  APPENDECTOMY    BELOW KNEE LEG AMPUTATION Left   CARDIAC CATHETERIZATION    CORONARY STENT INTERVENTION  2005  Great toe amputation right.    I&D EXTREMITY Left 12/30/2015  Procedure: IRRIGATION AND DEBRIDEMENT LEFT FOOT, TRANSMETATARSAL AMPUTATION WITH APPLICATION OF ANTIBIOTIC BEADS  AND WOUND VAC;  Surgeon: Nadara Mustard, MD;  Location: [facility] OR;  Service: Orthopedics;  Laterality: Left;  IR GASTROSTOMY TUBE MOD SED  12/23/2018  TONSILLECTOMY    TRACHEOSTOMY  11/2018 HPI: 63 year old male coming in with fever cough chest pain and shortness of breath was found to have influenza A. PMH: diabetes mellitus, hypertension.  Intubated multiple times 1/3 to 1/16 with trach placed 1/16. Repeat MBS on 3/28 for possible initiation of po's. This is pt's 3rd MBS. Plan to repeat to determine upgrade initially; on 3/10 concern pt may have aspirated and made NPO.   Subjective: pt is alert and cooperative but needs encouragement Assessment / Plan / Recommendation CHL IP CLINICAL IMPRESSIONS 02/04/2019 Clinical Impression Pt has been decannulated since 3/9 with evidence of incomplete stoma closure indicated by breathy vocal quality. He continues to exhibit moderate-severe pharyngeal dysphagia noteably due to incompetent laryngeal closure and poor retroflexion of epiglottis. Honey thick penetrated vestibule with initial swallow of MBS and expelled with cues for cough and was not penetrated with subsequent trial. Nectar thick entered vestibule with penetration aspiration score (PAS) of 3, 5 & 7 (did not exit trachea with cough). Chin tuck actually facilitated entrance of barium into trachea and swallow with head in neutral  applying pressure to gauze/stoma to increase pressure was ineffective. Mild-mod amount of vallecular and pyriform sinus residue not significantly improved. Recommend pt resume po's of honey thick liquids, texture of Dys 2, multiple swallows and hard coughs/throat clear after every other bite/sip. Pt should continue to receive ST at skilled nursing facility for continued intervention and repeat MBS when appropriate (not before 6-8 weeks). Will continue to follow while here.  SLP Visit Diagnosis Dysphagia, pharyngeal phase (R13.13) Attention and concentration deficit following -- Frontal lobe and executive function deficit following -- Impact on safety and function Severe aspiration risk   CHL IP TREATMENT RECOMMENDATION 02/04/2019 Treatment Recommendations Therapy as outlined in treatment plan below   Prognosis 02/04/2019 Prognosis for Safe Diet Advancement Good Barriers to Reach Goals -- Barriers/Prognosis Comment -- CHL IP DIET RECOMMENDATION 02/04/2019 SLP Diet Recommendations Dysphagia 2 (Fine chop) solids;Honey thick liquids Liquid Administration via Cup;No straw Medication Administration Whole meds with puree Compensations Slow rate;Small sips/bites;Multiple dry swallows after each bite/sip;Hard cough after swallow;Clear throat intermittently Postural Changes Seated upright at 90 degrees;Remain semi-upright after after feeds/meals (Comment)   CHL IP OTHER RECOMMENDATIONS 02/04/2019 Recommended Consults -- Oral Care Recommendations Oral care BID Other Recommendations --   CHL IP FOLLOW UP RECOMMENDATIONS 02/04/2019 Follow up Recommendations Skilled Nursing facility   [facility] IP FREQUENCY AND DURATION 02/04/2019 Speech Therapy Frequency (ACUTE ONLY) min 2x/week Treatment Duration 2 weeks      CHL IP ORAL PHASE 02/04/2019 Oral Phase WFL Oral - Pudding Teaspoon -- Oral - Pudding Cup -- Oral - Honey Teaspoon -- Oral - Honey Cup WFL Oral - Nectar Teaspoon -- Oral - Nectar Cup WFL Oral - Nectar Straw -- Oral - Thin Teaspoon --  Oral - Thin Cup NT Oral - Thin Straw -- Oral - Puree NT Oral - Mech Soft -- Oral - Regular NT Oral - Multi-Consistency -- Oral - Pill -- Oral Phase - Comment --  CHL IP PHARYNGEAL PHASE 02/04/2019 Pharyngeal Phase Impaired Pharyngeal- Pudding Teaspoon -- Pharyngeal -- Pharyngeal- Pudding Cup -- Pharyngeal -- Pharyngeal- Honey Teaspoon NT Pharyngeal -- Pharyngeal- Honey Cup Penetration/Aspiration during swallow;Pharyngeal residue - valleculae;Pharyngeal residue - pyriform;Reduced airway/laryngeal closure Pharyngeal Material enters airway, remains ABOVE vocal cords and not ejected out Pharyngeal- Nectar  Teaspoon NT Pharyngeal -- Pharyngeal- Nectar Cup Penetration/Aspiration during swallow;Reduced airway/laryngeal closure;Pharyngeal residue - valleculae;Pharyngeal residue - pyriform Pharyngeal Material enters airway, passes BELOW cords and not ejected out despite cough attempt by patient;Material enters airway, CONTACTS cords and not ejected out Pharyngeal- Nectar Straw -- Pharyngeal -- Pharyngeal- Thin Teaspoon NT Pharyngeal -- Pharyngeal- Thin Cup NT Pharyngeal -- Pharyngeal- Thin Straw -- Pharyngeal -- Pharyngeal- Puree NT Pharyngeal -- Pharyngeal- Mechanical Soft -- Pharyngeal -- Pharyngeal- Regular NT Pharyngeal -- Pharyngeal- Multi-consistency -- Pharyngeal -- Pharyngeal- Pill -- Pharyngeal -- Pharyngeal Comment --  CHL IP CERVICAL ESOPHAGEAL PHASE 02/04/2019 Cervical Esophageal Phase WFL Pudding Teaspoon -- Pudding Cup -- Honey Teaspoon -- Honey Cup -- Nectar Teaspoon -- Nectar Cup -- Nectar Straw -- Thin Teaspoon -- Thin Cup -- Thin Straw -- Puree -- Mechanical Soft -- Regular -- Multi-consistency -- Pill -- Cervical Esophageal Comment -- Royce Macadamia 02/04/2019, 10:23 AM Breck Coons [employer]ell [employer] administrator Pager [phone] Office [phone]              Dg Swallowing Func-speech Pathology  Result Date: 01/30/2019 Objective Swallowing Evaluation: Type of Study: MBS-Modified Barium  Swallow Study  Patient Details Name: Herbie Lehrmann. 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    I&D EXTREMITY Left 12/30/2015  Procedure: IRRIGATION AND DEBRIDEMENT LEFT FOOT, TRANSMETATARSAL AMPUTATION WITH APPLICATION OF ANTIBIOTIC BEADS AND WOUND VAC;  Surgeon: Nadara Mustard, MD;  Location: [facility] OR;  Service: Orthopedics;  Laterality: Left;  IR GASTROSTOMY TUBE MOD SED  12/23/2018  TONSILLECTOMY    TRACHEOSTOMY  11/2018 HPI: 63 year old male coming in with fever cough chest pain and shortness of breath was found to have influenza A. PMH: diabetes mellitus, hypertension.  Intubated multiple times 1/3 to 1/16 with trach placed 1/16. Repeat MBS on 3/28 for possible initiation of po's.  Subjective: pt is alert and cooperative but needs encouragement Assessment / Plan / Recommendation CHL IP CLINICAL IMPRESSIONS 01/30/2019 Clinical Impression Pt's trach was capped this morning and during MBS with normal oropharyngeal pressures. Swallow function has improved from prior study despite silent aspiration with thin and penetration of nectar and honey. Laryngeal excursion, pharyngeal contraction and epiglottic inversion are not of normal strength and ROM however improved from last week. Incomplete  closure led to penetration with thin, nectar and only flash with honey thick x 1. Chin tuck did not prevent penetration (silent) with nectar and vallecular and pyriform sinus residue present and was mildly increased with honey thick. Verbal cues to perform second swallow reduced volume. Puree was not penetrated this study. Recommend Dys 2 given mild oral residue, impulsivity and deconditioning. Honey thick liquids recommended with two swallows, intermittent throat clears and pills whole in applesauce. ST will continue to follow for safety and upgrade of po textures. SLP Visit Diagnosis  Dysphagia, oropharyngeal phase (R13.12) Attention and concentration deficit following -- Frontal lobe and executive function deficit following -- Impact on safety and function Moderate aspiration risk;Severe aspiration risk   CHL IP TREATMENT RECOMMENDATION 01/30/2019 Treatment Recommendations Therapy as outlined in treatment plan below   Prognosis 01/30/2019 Prognosis for Safe Diet Advancement Good Barriers to Reach Goals -- Barriers/Prognosis Comment -- CHL IP DIET RECOMMENDATION 01/30/2019 SLP Diet Recommendations Honey thick liquids;Dysphagia 2 (Fine chop) solids Liquid Administration via Cup;No straw Medication Administration Whole meds with puree Compensations Slow rate;Small sips/bites;Multiple dry swallows after each bite/sip;Clear throat intermittently Postural Changes Seated upright at 90 degrees   CHL IP OTHER RECOMMENDATIONS 01/30/2019 Recommended Consults -- Oral Care Recommendations Oral care BID Other Recommendations --   CHL IP FOLLOW UP RECOMMENDATIONS 01/30/2019 Follow up Recommendations (No Data)   CHL IP FREQUENCY AND DURATION 01/30/2019 Speech Therapy Frequency (ACUTE ONLY) min 2x/week Treatment Duration 2 weeks      CHL IP ORAL PHASE 01/30/2019 Oral Phase Impaired Oral - Pudding Teaspoon -- Oral - Pudding Cup -- Oral - Honey Teaspoon -- Oral - Honey Cup Lingual/palatal residue Oral - Nectar Teaspoon -- Oral - Nectar Cup  Lingual/palatal residue Oral - Nectar Straw -- Oral - Thin Teaspoon -- Oral - Thin Cup -- Oral - Thin Straw -- Oral - Puree Lingual/palatal residue Oral - Mech Soft -- Oral - Regular WFL Oral - Multi-Consistency -- Oral - Pill -- Oral Phase - Comment --  CHL IP PHARYNGEAL PHASE 01/30/2019 Pharyngeal Phase Impaired Pharyngeal- Pudding Teaspoon -- Pharyngeal -- Pharyngeal- Pudding Cup -- Pharyngeal -- Pharyngeal- Honey Teaspoon NT Pharyngeal -- Pharyngeal- Honey Cup Pharyngeal residue - valleculae;Pharyngeal residue - pyriform;Reduced epiglottic inversion;Penetration/Aspiration during swallow;Reduced airway/laryngeal closure Pharyngeal Material enters airway, remains ABOVE vocal cords then ejected out Pharyngeal- Nectar Teaspoon NT Pharyngeal -- Pharyngeal- Nectar Cup Penetration/Aspiration during swallow;Reduced airway/laryngeal closure;Reduced epiglottic inversion;Pharyngeal residue - valleculae;Pharyngeal residue - pyriform;Compensatory strategies attempted (with notebox) Pharyngeal Material enters airway, remains ABOVE vocal cords and not ejected out Pharyngeal- Nectar Straw -- Pharyngeal -- Pharyngeal- Thin Teaspoon NT Pharyngeal -- Pharyngeal- Thin Cup Penetration/Aspiration during swallow;Pharyngeal residue - valleculae;Pharyngeal residue - pyriform;Moderate aspiration;Trace aspiration;Reduced airway/laryngeal closure;Reduced epiglottic inversion Pharyngeal Material enters airway, passes BELOW cords without attempt by patient to eject out (silent aspiration) Pharyngeal- Thin Straw -- Pharyngeal -- Pharyngeal- Puree Pharyngeal residue - pyriform;Pharyngeal residue - valleculae;Reduced epiglottic inversion Pharyngeal Material does not enter airway Pharyngeal- Mechanical Soft -- Pharyngeal -- Pharyngeal- Regular WFL Pharyngeal -- Pharyngeal- Multi-consistency -- Pharyngeal -- Pharyngeal- Pill -- Pharyngeal -- Pharyngeal Comment --  CHL IP CERVICAL ESOPHAGEAL PHASE 01/30/2019 Cervical Esophageal Phase WFL Pudding  Teaspoon -- Pudding Cup -- Honey Teaspoon -- Honey Cup -- Nectar Teaspoon -- Nectar Cup -- Nectar Straw -- Thin Teaspoon -- Thin Cup -- Thin Straw -- Puree -- Mechanical Soft -- Regular -- Multi-consistency -- Pill -- Cervical Esophageal Comment -- Royce Macadamia 01/30/2019, 2:53 PM Breck Coons [employer] administrator Pager [phone] Office [phone]              Dg Swallowing Func-speech Pathology  Result Date: 01/23/2019 Objective Swallowing Evaluation: Type of Study: MBS-Modified Barium Swallow Study  Patient Details Name: Rommie Dunn. MRN: [id number] Date of Birth: 01/16/1956 Today's Date: 01/23/2019 Time: SLP Start Time (ACUTE ONLY): 1254 -SLP Stop Time (ACUTE ONLY): 1308 SLP Time Calculation (min) (ACUTE ONLY): 14 min Past Medical History: Past Medical History: Diagnosis Date  Coronary artery disease   Diabetes mellitus without complication (HCC)   Hyperlipidemia  Hypertension   Osteomyelitis (HCC) 03/25/2017  RT FOOT Past Surgical History: Past Surgical History: Procedure Laterality Date  AMPUTATION Right 03/27/2017  Procedure: 1st Ray Amputation Right Foot;  Surgeon: Nadara Mustard, MD;  Location: [facility] OR;  Service: Orthopedics;  Laterality: Right;  APPENDECTOMY    BELOW KNEE LEG AMPUTATION Left   CARDIAC CATHETERIZATION    CORONARY STENT INTERVENTION  2005  Great toe amputation right.    I&D EXTREMITY Left 12/30/2015  Procedure: IRRIGATION AND DEBRIDEMENT LEFT FOOT, TRANSMETATARSAL AMPUTATION WITH APPLICATION OF ANTIBIOTIC BEADS AND WOUND VAC;  Surgeon: Nadara Mustard, MD;  Location: [facility] OR;  Service: Orthopedics;  Laterality: Left;  IR GASTROSTOMY TUBE MOD SED  12/23/2018  TONSILLECTOMY    TRACHEOSTOMY  11/2018 HPI: 63 year old male coming in with fever cough chest pain and shortness of breath was found to have influenza A. PMH: diabetes mellitus, hypertension.  Intubated multiple times 1/3 to 1/16 with trach placed 1/16.   Subjective: pt is alert and cooperative  but needs encouragement Assessment / Plan / Recommendation CHL IP CLINICAL IMPRESSIONS 01/23/2019 Clinical Impression Pt has a moderate pharyngeal dysphagia due to reduced strength and airway closure s/p prolonged (and repeated) intubations. His timing for swallow trigger is good, but he has reduced hyolaryngeal movement, pharyngeal squeeze, epiglottic inversion, and tonge base retraction. Thin and nectar thick liquids enter the laryngeal vestibule during the swallow because of his incomplete closure, with penetration intermittently reaching the true vocal folds without spontaneous cough elicited. Multiple cued coughs were required to eject penetrates. Honey thick liquids and purees left more residue in the valleculae and pyriform sinuses, with intermittent penetration after the swallow from the residual material. Recommend he remain NPO except for initiating small amounts of purees and spoonfuls of thickened liquids during SLP visits only to facilitate restrengthening. Pt may also benefit from EMST and other exercises to progress strength of cough and pharyngeal musculature.   SLP Visit Diagnosis Dysphagia, pharyngeal phase (R13.13) Attention and concentration deficit following -- Frontal lobe and executive function deficit following -- Impact on safety and function Moderate aspiration risk   CHL IP TREATMENT RECOMMENDATION 01/23/2019 Treatment Recommendations Therapy as outlined in treatment plan below   Prognosis 01/23/2019 Prognosis for Safe Diet Advancement Good Barriers to Reach Goals -- Barriers/Prognosis Comment -- CHL IP DIET RECOMMENDATION 01/23/2019 SLP Diet Recommendations NPO Liquid Administration via -- Medication Administration Via alternative means Compensations -- Postural Changes --   CHL IP OTHER RECOMMENDATIONS 01/23/2019 Recommended Consults -- Oral Care Recommendations Oral care QID Other Recommendations Have oral suction available   CHL IP FOLLOW UP RECOMMENDATIONS 01/23/2019 Follow up  Recommendations Inpatient Rehab   CHL IP FREQUENCY AND DURATION 01/23/2019 Speech Therapy Frequency (ACUTE ONLY) min 2x/week Treatment Duration 2 weeks      CHL IP ORAL PHASE 01/23/2019 Oral Phase WFL Oral - Pudding Teaspoon -- Oral - Pudding Cup -- Oral - Honey Teaspoon -- Oral - Honey Cup -- Oral - Nectar Teaspoon -- Oral - Nectar Cup -- Oral - Nectar Straw -- Oral - Thin Teaspoon -- Oral - Thin Cup -- Oral - Thin Straw -- Oral - Puree -- Oral - Mech Soft -- Oral - Regular -- Oral - Multi-Consistency -- Oral - Pill -- Oral Phase - Comment --  CHL IP PHARYNGEAL PHASE 01/23/2019 Pharyngeal Phase Impaired Pharyngeal- Pudding Teaspoon -- Pharyngeal -- Pharyngeal- Pudding Cup -- Pharyngeal -- Pharyngeal- Honey Teaspoon Reduced pharyngeal peristalsis;Reduced epiglottic inversion;Reduced anterior laryngeal mobility;Reduced laryngeal elevation;Reduced airway/laryngeal closure;Reduced tongue base retraction;Pharyngeal residue -  valleculae;Pharyngeal residue - pyriform Pharyngeal -- Pharyngeal- Honey Cup -- Pharyngeal -- Pharyngeal- Nectar Teaspoon Reduced pharyngeal peristalsis;Reduced epiglottic inversion;Reduced anterior laryngeal mobility;Reduced laryngeal elevation;Reduced airway/laryngeal closure;Reduced tongue base retraction;Pharyngeal residue - valleculae;Pharyngeal residue - pyriform;Penetration/Aspiration during swallow Pharyngeal Material enters airway, remains ABOVE vocal cords and not ejected out Pharyngeal- Nectar Cup Reduced pharyngeal peristalsis;Reduced epiglottic inversion;Reduced anterior laryngeal mobility;Reduced laryngeal elevation;Reduced airway/laryngeal closure;Reduced tongue base retraction;Pharyngeal residue - valleculae;Pharyngeal residue - pyriform;Penetration/Aspiration during swallow Pharyngeal Material enters airway, remains ABOVE vocal cords and not ejected out Pharyngeal- Nectar Straw -- Pharyngeal -- Pharyngeal- Thin Teaspoon Reduced pharyngeal peristalsis;Reduced epiglottic  inversion;Reduced anterior laryngeal mobility;Reduced laryngeal elevation;Reduced airway/laryngeal closure;Reduced tongue base retraction;Penetration/Aspiration during swallow Pharyngeal Material enters airway, CONTACTS cords and not ejected out Pharyngeal- Thin Cup -- Pharyngeal -- Pharyngeal- Thin Straw -- Pharyngeal -- Pharyngeal- Puree Reduced pharyngeal peristalsis;Reduced epiglottic inversion;Reduced anterior laryngeal mobility;Reduced laryngeal elevation;Reduced airway/laryngeal closure;Reduced tongue base retraction;Pharyngeal residue - valleculae;Pharyngeal residue - pyriform;Penetration/Apiration after swallow Pharyngeal Material enters airway, remains ABOVE vocal cords and not ejected out Pharyngeal- Mechanical Soft -- Pharyngeal -- Pharyngeal- Regular -- Pharyngeal -- Pharyngeal- Multi-consistency -- Pharyngeal -- Pharyngeal- Pill -- Pharyngeal -- Pharyngeal Comment --  CHL IP CERVICAL ESOPHAGEAL PHASE 01/23/2019 Cervical Esophageal Phase WFL Pudding Teaspoon -- Pudding Cup -- Honey Teaspoon -- Honey Cup -- Nectar Teaspoon -- Nectar Cup -- Nectar Straw -- Thin Teaspoon -- Thin Cup -- Thin Straw -- Puree -- Mechanical Soft -- Regular -- Multi-consistency -- Pill -- Cervical Esophageal Comment -- Virl Axe Nix 01/23/2019, 2:32 PM  [name], M.A. CCC-SLP Acute Rehabilitation Services Pager [phone] Office [phone]               LOS: 76 days   Jeoffrey Massed, MD  [facility]  If 7PM-7AM, please contact night-coverage  Please page via [email or web address]  Go to [email or web address] and use [facility] universal password to access. If you do not have the password, please contact the hospital operator.  Locate the [facility] provider you are looking for under [facility] and page to a number that you can be directly reached. If you still have difficulty reaching the provider, please page the [facility] (Director on Call) for the Hospitalists listed on amion for assistance.  02/12/2019, 2:38 PM

## 2019-02-13 LAB — GLUCOSE, CAPILLARY
GLUCOSE-CAPILLARY: 125 mg/dL — AB (ref 70–99)
Glucose-Capillary: 93 mg/dL (ref 70–99)
Glucose-Capillary: 131 mg/dL — ABNORMAL HIGH (ref 70–99)
Glucose-Capillary: 140 mg/dL — ABNORMAL HIGH (ref 70–99)

## 2019-02-13 NOTE — Discharge Instructions (Signed)
Continue Dakins twice daily for 3 days. Patient will need twice daily wet to dry dressing changes with normal saline dampened Kerlix packed in the wound (make sure to get areas of tunneling) with dry 4x4 over this and abd pad covering wound.

## 2019-02-13 NOTE — Progress Notes (Signed)
Physical Therapy Wound Treatment Patient Details  Name: Antonio [name]. MRN: [id number] Date of Birth: [birth year]  Today's Date: 02/13/2019 Time: 1010-1036 Time Calculation (min): 26 min  Subjective  Subjective: Need to get pain meds 1 hour prior next session. Patient and Family Stated Goals: "go home." Prior Treatments: dressing changes  Pain Score: Pain Score: 7   Wound Assessment  Pressure Injury 12/01/18 Unstageable - Full thickness tissue loss in which the base of the ulcer is covered by slough (yellow, tan, gray, green or brown) and/or eschar (tan, brown or black) in the wound bed. sacrum (Active)  Wound Image   02/10/2019 11:27 AM  Dressing Type Gauze (Comment);ABD;Barrier Film (skin prep);Other (Comment);Impregnated gauze (petrolatum);Moist to dry 02/13/2019 10:51 AM  Dressing Clean;Dry;Intact 02/13/2019 10:51 AM  Dressing Change Frequency Twice a day 02/13/2019 10:51 AM  State of Healing Early/partial granulation 02/13/2019 10:51 AM  Site / Wound Assessment Red;Yellow;Pink 02/13/2019 10:51 AM  % Wound base Red or Granulating 55% 02/13/2019 10:51 AM  % Wound base Yellow/Fibrinous Exudate 45% 02/13/2019 10:51 AM  % Wound base Black/Eschar 0% 02/13/2019 10:51 AM  % Wound base Other/Granulation Tissue (Comment) 0% 02/13/2019 10:51 AM  Peri-wound Assessment Erythema (blanchable);Pink 02/13/2019 10:51 AM  Wound Length (cm) 7 cm 02/10/2019 11:27 AM  Wound Width (cm) 5 cm 02/10/2019 11:27 AM  Wound Depth (cm) 4 cm 02/10/2019 11:27 AM  Wound Surface Area (cm^2) 35 cm^2 02/10/2019 11:27 AM  Wound Volume (cm^3) 140 cm^3 02/10/2019 11:27 AM  Tunneling (cm) 4.5 02/10/2019 11:27 AM  Margins Unattached edges (unapproximated) 02/13/2019 10:51 AM  Drainage Amount Copious 02/13/2019 10:51 AM  Drainage Description Purulent;Green;Odor 02/13/2019 10:51 AM  Treatment Hydrotherapy (Pulse lavage);Packing (Impregnated strip);Packing (Saline gauze);Tape changed;Debridement (Selective) 02/13/2019 10:51 AM       Hydrotherapy Pulsed lavage therapy - wound location: sacrum Pulsed Lavage with Suction (psi): 12 psi Pulsed Lavage with Suction - Normal Saline Used: 1000 mL Pulsed Lavage Tip: Tip with splash shield Selective Debridement Selective Debridement - Location: sacrum Selective Debridement - Tools Used: Forceps;Scissors Selective Debridement - Tissue Removed: necrotic tissue   Wound Assessment and Plan  Wound Therapy - Assess/Plan/Recommendations Wound Therapy - Clinical Statement: Dressing, bed pad and fitted sheet soaked through with greenish drainage.  Gauze packing adherent to periwound area and initated bleeding with removal.  Able to remove yellow fibrous necrotic tissue in inner wound bed.  Continuing with Dakins dressing and placed petroleum gauze on periwound for improved dressing removal.  Remains appropriate for continued daily hydrotherapy.  Surgery paged to view wound but no call back.  Wound Therapy - Functional Problem List: decr mobility Factors Delaying/Impairing Wound Healing: Diabetes Mellitus;Immobility;Multiple medical problems Hydrotherapy Plan: Debridement;Dressing change;Patient/family education;Pulsatile lavage with suction Wound Therapy - Frequency: 6X / week Wound Therapy - Follow Up Recommendations: Skilled nursing facility Wound Plan: see above  Wound Therapy Goals- Improve the function of patient's integumentary system by progressing the wound(s) through the phases of wound healing (inflammation - proliferation - remodeling) by:    Goals will be updated until maximal potential achieved or discharge criteria met.  Discharge criteria: when goals achieved, discharge from hospital, MD decision/surgical intervention, no progress towards goals, refusal/missing three consecutive treatments without notification or medical reason.  GP     Reginia Naas 02/13/2019, 10:59 AM  [place] [phone] 02/13/2019

## 2019-02-13 NOTE — Progress Notes (Signed)
Physical Therapy Treatment Patient Details Name: Antonio [name]. MRN: [id number] DOB: Oct 18, 1956 Today's Date: 02/13/2019    History of Present Illness Pt is a 63 y.o. male admitted 11/28/18 with flu A and PNA; acute respiratory failure intubated 1/4. Trach placed 1/16. PEG placed 1/28. Hospital course complicared by acute MI, CHF, cardiogenic shock, and mucous plugging. Also with sacral wound.  De-cannulated 02/02/19. PMH includes DM, HTN, HLD, CAD, L BKA, R transmet amputation.    PT Comments    Pt performed gait training and functional mobility.  He refused to wear a gown and ambulated in his underpants.  Pt is progressing well with minor impairments particularly during turns with RW.  Pt refused gait belt and supplemental O2.  Plan for SNF remains appropriate to improve strength and function before returning home.    Follow Up Recommendations  SNF     Equipment Recommendations  Rolling walker with 5" wheels;3in1 (PT)    Recommendations for Other Services       Precautions / Restrictions Precautions Precautions: Fall Precaution Comments: PEG, monitor O2 sats Restrictions Weight Bearing Restrictions: No    Mobility  Bed Mobility Overal bed mobility: Modified Independent   Rolling: Modified independent (Device/Increase time)   Supine to sit: Modified independent (Device/Increase time) Sit to supine: Modified independent (Device/Increase time)   General bed mobility comments: No assistance needed remains to present with heavy use of railing in air bed.   Transfers Overall transfer level: Needs assistance Equipment used: Rolling walker (2 wheeled) Transfers: Sit to/from Stand Sit to Stand: Supervision         General transfer comment: Cues for safety.    Ambulation/Gait Ambulation/Gait assistance: Supervision;Min guard Gait Distance (Feet): 140 Feet Assistive device: Rolling walker (2 wheeled) Gait Pattern/deviations: Step-through pattern;Trunk flexed Gait  velocity: decreased   General Gait Details: Pt with one minor instance picking up L foot ( prosthesis) , minor balance impairment with turns.     Stairs             Wheelchair Mobility    Modified Rankin (Stroke Patients Only)       Balance Overall balance assessment: Needs assistance   Sitting balance-Leahy Scale: Good       Standing balance-Leahy Scale: Fair                              Cognition Arousal/Alertness: Awake/alert Behavior During Therapy: Impulsive Overall Cognitive Status: Within Functional Limits for tasks assessed                                        Exercises      General Comments        Pertinent Vitals/Pain Pain Assessment: Faces Faces Pain Scale: Hurts a little bit Pain Location: Sacral wound Pain Intervention(s): Monitored during session;Repositioned    Home Living                      Prior Function            PT Goals (current goals can now be found in the care plan section) Acute Rehab PT Goals Patient Stated Goal: to get stronger so he can go home.  Potential to Achieve Goals: Good Progress towards PT goals: Progressing toward goals    Frequency    Min 2X/week  PT Plan Current plan remains appropriate    Co-evaluation              AM-PAC PT "6 Clicks" Mobility   Outcome Measure  Help needed turning from your back to your side while in a flat bed without using bedrails?: A Little Help needed moving from lying on your back to sitting on the side of a flat bed without using bedrails?: A Little Help needed moving to and from a bed to a chair (including a wheelchair)?: A Little Help needed standing up from a chair using your arms (e.g., wheelchair or bedside chair)?: A Little Help needed to walk in hospital room?: A Little Help needed climbing 3-5 steps with a railing? : A Little 6 Click Score: 18    End of Session Equipment Utilized During Treatment: (pt  refused gt belt and supplemental O2 during session.  ) Activity Tolerance: Patient tolerated treatment well   Nurse Communication: Mobility status PT Visit Diagnosis: Muscle weakness (generalized) (M62.81);Unsteadiness on feet (R26.81);Difficulty in walking, not elsewhere classified (R26.2)     Time: 7893-8101 PT Time Calculation (min) (ACUTE ONLY): 22 min  Charges:  $Gait Training: 8-22 mins                     Joycelyn Rua, PTA Acute Rehabilitation Services Pager [phone] Office [phone]     Hanson Medeiros Artis Delay 02/13/2019, 4:08 PM

## 2019-02-13 NOTE — TOC Progression Note (Signed)
Transition of Care (TOC) - Progression Note    Patient Details  Name: Antonio [name]. MRN: [id number] Date of Birth: October 29, 1956  Transition of Care [facility]) CM/SW Contact  Mearl Latin, LCSW Phone Number: 02/13/2019, 8:52 AM  Clinical Narrative:    CSW notified SNF that patient will not require hydrotherapy at discharge. They will be able to accept patient once his son provides a copy of patient's bank statement so that they can have Medicaid transferred to them. CSW spoke with patient's son to check on status. He states the bank requires him to be POA before they will release the statement. He reports that he has the POA paperwork but has to find a notary, which he is hoping to complete by Monday.     Expected Discharge Plan: Skilled Nursing Facility    Expected Discharge Plan and Services Expected Discharge Plan: Skilled Nursing Facility In-house Referral: Clinical Social Work Discharge Planning Services: CM Consult                               Social Determinants of Health (SDOH) Interventions    Readmission Risk Interventions No flowsheet data found.

## 2019-02-13 NOTE — Progress Notes (Signed)
PROGRESS NOTE        PATIENT DETAILS Name: Antonio [name]. Age: 63 y.o. Sex: male Date of Birth: [birth year] Admit Date: 11/28/2018 Admitting Physician Reyes Ivan, MD ZOX:WRUE, Orie Rout, NP  Brief Narrative: Patient is a 63 y.o. male admitted on 1/3 with influenza A causing acute hypoxic respiratory failure, cardiogenic shock-required prolonged mechanical ventilation-is currently s/p tracheostomy and PEG tube placement.  Significant events: 1/03  Admit, Influenza A positive 1/06  Self extubated  1/07  Re-intubated  1/08  Self-extubated  1/16 tracheostomy 1/21  Trach revision to XLT distal 1/28 PEG tube 2/03  Weaned 15 minutes  2/11  ATC  2/14  Trach changed to #4, mucus plugging >back to #5 XLT 2/15  Failed ATC with increased WOB 2/16  Failed ATC with increased WOB  2/21 back on pressure support 2/24 back on TC  01/27/19 - 5 XLT cuffless trach  01/29/2019: Size 4 cuff less tracheostomy placed 01/30/2019: Capping trials initiated 02/02/2019: Tracheostomy decannulated  Subjective: Lying comfortably in bed-no major issues overnight.  Denies any chest pain or shortness of breath.  Assessment/Plan: Fever: Afebrile over the past few days-chest x-ray without any infiltrate, blood culture and urine culture on 3/11 negative.  Currently off all antimicrobial therapy-continue to monitor.  Stage IV sacral/coccygeal ulceration: Continue with hydrotherapy while inpatient-once discharge-General surgery recommending Dakins twice daily for 3 days. Patient will need twice daily wet to dry dressing changes with normal saline dampened Kerlix packed in the wound (make sure to get areas of tunneling) with dry 4x4 over this and abd pad covering wound.   Acute hypoxic respiratory failure secondary to influenza A, pseudomonal/Corynebacterium pneumonia: Prolonged hospitalization requiring prolonged ventilator support-subsequently tracheostomy on 1/16.  Currently  decannulated on 3/9-Per last PCCM note-if the wound does not close in 2 weeks-may need evaluation for tracheocutaneous fistula.  Acute systolic heart failure (EF 20-25% on 1/4, EF 60-65% by TTE on 1/29): Compensated-continue beta-blocker.  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  Cardiology recommending further work-up to be done in the outpatient setting.  Dysphagia: Speech therapy following-recommendations were for a dysphagia 2 diet-however patient noncompliant-and as a result SLP has upgraded to a regular diet.  No signs of aspiration at this point.  Metabolic encephalopathy: Resolved.  Etiology felt to be secondary to hypoxia/pneumonia.  DM-2: CBG stable continue Levemir 25 units twice daily and SSI.  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  Chronic pain syndrome: Continue Nucynta and as needed oxycodone  Debility/deconditioning: Secondary to acute illness-awaiting SNF bed  DVT Prophylaxis: Prophylactic Lovenox   Code Status: Full code  Family Communication: None at bedside  Disposition Plan: Remain inpatient-SNF on discharge  Antimicrobial agents: Anti-infectives (From admission, onward)   Start     Dose/Rate Route Frequency Ordered Stop   02/04/19 1300  vancomycin (VANCOCIN) 1,250 mg in sodium chloride 0.9 % 250 mL IVPB  Status:  Discontinued     1,250 mg 166.7 mL/hr over 90 Minutes Intravenous Every 12 hours 02/04/19 1200 02/06/19 1002   02/04/19 1200  metroNIDAZOLE (FLAGYL) IVPB 500 mg  Status:  Discontinued     500 mg 100 mL/hr over 60 Minutes Intravenous Every 8 hours 02/04/19 1146 02/06/19 1002   02/04/19 0930  ceFEPIme (MAXIPIME) 2 g in sodium chloride 0.9 % 100 mL IVPB  2 g 200 mL/hr over 30 Minutes Intravenous Every 8 hours 02/04/19 0926 02/08/19 2359   02/03/19 1200  meropenem (MERREM) 1 g in sodium chloride 0.9 % 100 mL IVPB  Status:  Discontinued     1 g 200 mL/hr over 30 Minutes Intravenous Every 8  hours 02/03/19 1054 02/04/19 0924   01/19/19 1400  cefTAZidime (FORTAZ) 2 g in sodium chloride 0.9 % 100 mL IVPB     2 g 200 mL/hr over 30 Minutes Intravenous Every 8 hours 01/19/19 1120 01/24/19 2359   01/15/19 1500  cefTAZidime (FORTAZ) 2 g in sodium chloride 0.9 % 100 mL IVPB     2 g 200 mL/hr over 30 Minutes Intravenous Every 8 hours 01/15/19 1448 01/19/19 0542   01/14/19 1400  colistimethate ((COLYMYCIN-M)) nebulized solution 150 mg  Status:  Discontinued     150 mg Inhalation 3 times daily 01/14/19 1142 01/19/19 1119   01/14/19 1300  meropenem (MERREM) 1 g in sodium chloride 0.9 % 100 mL IVPB  Status:  Discontinued     1 g 200 mL/hr over 30 Minutes Intravenous Every 8 hours 01/14/19 1235 01/15/19 1448   12/23/18 1506  vancomycin (VANCOCIN) 1-5 GM/200ML-% IVPB    Note to Pharmacy:  [facility]   : cabinet override      12/23/18 1506 12/23/18 1515   12/20/18 2200  vancomycin (VANCOCIN) IVPB 1000 mg/200 mL premix  Status:  Discontinued     1,000 mg 200 mL/hr over 60 Minutes Intravenous Every 12 hours 12/20/18 1804 12/22/18 1413   12/18/18 0800  ceFEPIme (MAXIPIME) 1 g in sodium chloride 0.9 % 100 mL IVPB  Status:  Discontinued     1 g 200 mL/hr over 30 Minutes Intravenous Every 8 hours 12/18/18 0713 12/19/18 1201   12/17/18 0530  vancomycin (VANCOCIN) 1,500 mg in sodium chloride 0.9 % 500 mL IVPB  Status:  Discontinued     1,500 mg 250 mL/hr over 120 Minutes Intravenous Every 12 hours 12/16/18 1704 12/20/18 1804   12/16/18 1730  cefTAZidime (FORTAZ) 2 g in sodium chloride 0.9 % 100 mL IVPB  Status:  Discontinued     2 g 200 mL/hr over 30 Minutes Intravenous Every 8 hours 12/16/18 1704 12/18/18 0702   12/16/18 1730  vancomycin (VANCOCIN) 2,000 mg in sodium chloride 0.9 % 500 mL IVPB     2,000 mg 250 mL/hr over 120 Minutes Intravenous  Once 12/16/18 1704 12/16/18 2030   12/07/18 1300  erythromycin 500 mg in sodium chloride 0.9 % 100 mL IVPB  Status:  Discontinued     500 mg 100  mL/hr over 60 Minutes Intravenous Every 6 hours 12/07/18 1118 12/16/18 1627   12/06/18 1500  vancomycin (VANCOCIN) 1,250 mg in sodium chloride 0.9 % 250 mL IVPB  Status:  Discontinued     1,250 mg 166.7 mL/hr over 90 Minutes Intravenous Every 12 hours 12/06/18 1436 12/07/18 1118   12/06/18 0100  vancomycin (VANCOCIN) 1,250 mg in sodium chloride 0.9 % 250 mL IVPB     1,250 mg 166.7 mL/hr over 90 Minutes Intravenous  Once 12/05/18 1054 12/06/18 0146   12/05/18 1200  vancomycin (VANCOCIN) 2,000 mg in sodium chloride 0.9 % 500 mL IVPB     2,000 mg 250 mL/hr over 120 Minutes Intravenous  Once 12/05/18 1054 12/05/18 1350   12/05/18 1200  cefTAZidime (FORTAZ) 1 g in sodium chloride 0.9 % 100 mL IVPB  Status:  Discontinued     1 g 200 mL/hr over  30 Minutes Intravenous Every 8 hours 12/05/18 1106 12/07/18 1118   11/30/18 2200  oseltamivir (TAMIFLU) 6 MG/ML suspension 30 mg     30 mg Per Tube 2 times daily 11/30/18 1348 12/03/18 2159   11/30/18 2200  ceFEPIme (MAXIPIME) 1 g in sodium chloride 0.9 % 100 mL IVPB  Status:  Discontinued     1 g 200 mL/hr over 30 Minutes Intravenous Every 12 hours 11/30/18 1350 12/01/18 1039   11/29/18 2200  ceFEPIme (MAXIPIME) 2 g in sodium chloride 0.9 % 100 mL IVPB  Status:  Discontinued     2 g 200 mL/hr over 30 Minutes Intravenous Every 12 hours 11/29/18 1645 11/30/18 1350   11/29/18 1100  oseltamivir (TAMIFLU) 6 MG/ML suspension 75 mg  Status:  Discontinued     75 mg Per Tube 2 times daily 11/29/18 1000 11/30/18 1348   11/28/18 2300  oseltamivir (TAMIFLU) capsule 75 mg  Status:  Discontinued     75 mg Oral 2 times daily 11/28/18 2238 11/29/18 1000   11/28/18 2030  oseltamivir (TAMIFLU) capsule 75 mg     75 mg Oral  Once 11/28/18 2029 11/28/18 2207   11/28/18 1745  vancomycin (VANCOCIN) 2,000 mg in sodium chloride 0.9 % 500 mL IVPB     2,000 mg 250 mL/hr over 120 Minutes Intravenous  Once 11/28/18 1741 11/28/18 2203   11/28/18 1745  piperacillin-tazobactam  (ZOSYN) IVPB 3.375 g     3.375 g 100 mL/hr over 30 Minutes Intravenous  Once 11/28/18 1741 11/28/18 1834      Procedures: See above  CONSULTS:  cardiology, pulmonary/intensive care and general surgery  Time spent: 15 minutes-Greater than 50% of this time was spent in counseling, explanation of diagnosis, planning of further management, and coordination of care.  MEDICATIONS: Scheduled Meds:  arformoterol  15 mcg Nebulization BID   aspirin  81 mg Per Tube Daily   atorvastatin  40 mg Per Tube q1800   budesonide (PULMICORT) nebulizer solution  0.5 mg Nebulization BID   carvedilol  3.125 mg Per Tube BID WC   [START ON 02/15/2019] collagenase   Topical Daily   enoxaparin (LOVENOX) injection  40 mg Subcutaneous Q24H   famotidine  20 mg Per Tube BID   feeding supplement (PRO-STAT SUGAR FREE 64)  30 mL Oral BID   folic acid  1 mg Per Tube Daily   guaiFENesin  5 mL Per Tube Q12H   insulin aspart  0-20 Units Subcutaneous TID WC   insulin detemir  25 Units Subcutaneous BID   multivitamin with minerals  1 tablet Oral Daily   polyethylene glycol  17 g Per Tube Daily   sodium hypochlorite   Irrigation Daily   tapentadol  50 mg Oral QID   thiamine  100 mg Per Tube Daily   Continuous Infusions:  sodium chloride 10 mL/hr at 01/24/19 2000   PRN Meds:.sodium chloride, acetaminophen (TYLENOL) oral liquid 160 mg/5 mL, albuterol, alum & mag hydroxide-simeth **AND** lidocaine, bisacodyl, clonazePAM, docusate, ipratropium-albuterol, oxyCODONE, Resource ThickenUp Clear, senna   PHYSICAL EXAM: Vital signs: Vitals:   02/13/19 0539 02/13/19 0748 02/13/19 0907 02/13/19 1341  BP: 139/86  137/68 129/77  Pulse: 81  90   Resp: 16   15  Temp: 98.1 F (36.7 C)   98.6 F (37 C)  TempSrc: Oral   Oral  SpO2: (!) 89% 93%    Weight:      Height:       Filed Weights   02/11/19 0547  02/12/19 0542 02/13/19 0436  Weight: 81.2 kg 87 kg 63 kg   Body mass index is 18.84 kg/m.    General appearance :Awake, alert, not in any distress.  HEENT: Atraumatic and Normocephalic Resp:Good air entry bilaterally, no added sounds  CVS: S1 S2 regular, no murmurs.  GI: Bowel sounds present, Non tender and not distended with no gaurding, rigidity or rebound.No organomegaly Extremities: Left AKA Neurology: Nonfocal Musculoskeletal:No digital cyanosis Skin:No Rash, warm and dry  I have personally reviewed following labs and imaging studies  LABORATORY DATA: CBC: Recent Labs  Lab 02/07/19 0549 02/09/19 0502 02/11/19 0320  WBC 7.4 4.8 7.1  NEUTROABS 5.2 3.2 3.9  HGB 9.4* 9.0* 10.9*  HCT 31.2* 31.6* 36.0*  MCV 86.7 91.3 85.7  PLT 176 224 271    Basic Metabolic Panel: Recent Labs  Lab 02/07/19 0549 02/09/19 0502 02/11/19 0320  NA 134* 141 136  K 3.9 3.1* 4.5  CL 98 100 97*  CO2 26 31 34*  GLUCOSE 166* 84 180*  BUN 13 14 16   CREATININE 0.42* 1.11 0.53*  CALCIUM 8.6* 9.6 8.8*  MG 1.8 2.4 1.7    GFR: Estimated Creatinine Clearance: 86.4 mL/min (A) (by C-G formula based on SCr of 0.53 mg/dL (L)).  Liver Function Tests: Recent Labs  Lab 02/09/19 0502  AST 19  ALT 8  ALKPHOS 108  BILITOT 1.7*  PROT 5.6*  ALBUMIN 3.0*   No results for input(s): LIPASE, AMYLASE in the last 168 hours. No results for input(s): AMMONIA in the last 168 hours.  Coagulation Profile: No results for input(s): INR, PROTIME in the last 168 hours.  Cardiac Enzymes: No results for input(s): CKTOTAL, CKMB, CKMBINDEX, TROPONINI in the last 168 hours.  BNP (last 3 results) No results for input(s): PROBNP in the last 8760 hours.  HbA1C: No results for input(s): HGBA1C in the last 72 hours.  CBG: Recent Labs  Lab 02/12/19 1301 02/12/19 1630 02/12/19 2207 02/13/19 0803 02/13/19 1158  GLUCAP 171* 171* 151* 93 131*    Lipid Profile: No results for input(s): CHOL, HDL, LDLCALC, TRIG, CHOLHDL, LDLDIRECT in the last 72 hours.  Thyroid Function Tests: No results for  input(s): TSH, T4TOTAL, FREET4, T3FREE, THYROIDAB in the last 72 hours.  Anemia Panel: No results for input(s): VITAMINB12, FOLATE, FERRITIN, TIBC, IRON, RETICCTPCT in the last 72 hours.  Urine analysis:    Component Value Date/Time   COLORURINE YELLOW 02/04/2019 1400   APPEARANCEUR HAZY (A) 02/04/2019 1400   LABSPEC 1.028 02/04/2019 1400   PHURINE 5.0 02/04/2019 1400   GLUCOSEU 150 (A) 02/04/2019 1400   HGBUR NEGATIVE 02/04/2019 1400   BILIRUBINUR NEGATIVE 02/04/2019 1400   KETONESUR NEGATIVE 02/04/2019 1400   PROTEINUR 100 (A) 02/04/2019 1400   NITRITE NEGATIVE 02/04/2019 1400   LEUKOCYTESUR NEGATIVE 02/04/2019 1400    Sepsis Labs: Lactic Acid, Venous    Component Value Date/Time   LATICACIDVEN 1.3 01/09/2019 0620    MICROBIOLOGY: Recent Results (from the past 240 hour(s))  Expectorated sputum assessment w rflx to resp cult     Status: None   Collection Time: 02/04/19 10:44 AM  Result Value Ref Range Status   Specimen Description SPUTUM  Final   Special Requests NONE  Final   Sputum evaluation   Final    THIS SPECIMEN IS ACCEPTABLE FOR SPUTUM CULTURE Performed at [facility], [address], [place], [place] [address]    Report Status 02/04/2019 FINAL  Final  Culture, respiratory  Status: None   Collection Time: 02/04/19 10:44 AM  Result Value Ref Range Status   Specimen Description SPUTUM  Final   Special Requests NONE Reflexed from [id number]  Final   Gram Stain   Final    ABUNDANT WBC PRESENT, PREDOMINANTLY PMN RARE SQUAMOUS EPITHELIAL CELLS PRESENT RARE GRAM POSITIVE RODS Performed at [facility], [address] [place], [place] [address]    Culture FEW PSEUDOMONAS AERUGINOSA  Final   Report Status 02/07/2019 FINAL  Final   Organism ID, Bacteria PSEUDOMONAS AERUGINOSA  Final      Susceptibility   Pseudomonas aeruginosa - MIC*    CEFTAZIDIME 4 SENSITIVE Sensitive     CIPROFLOXACIN <=0.25 SENSITIVE Sensitive     GENTAMICIN <=1 SENSITIVE  Sensitive     IMIPENEM 2 SENSITIVE Sensitive     PIP/TAZO 16 SENSITIVE Sensitive     CEFEPIME 4 SENSITIVE Sensitive     * FEW PSEUDOMONAS AERUGINOSA  Culture, blood (routine x 2)     Status: None   Collection Time: 02/04/19 11:52 AM  Result Value Ref Range Status   Specimen Description BLOOD LEFT ARM  Final   Special Requests   Final    BOTTLES DRAWN AEROBIC ONLY Blood Culture adequate volume   Culture   Final    NO GROWTH 5 DAYS Performed at [facility], [address] [place], [place] [address]    Report Status 02/09/2019 FINAL  Final  Culture, blood (routine x 2)     Status: None   Collection Time: 02/04/19 12:00 PM  Result Value Ref Range Status   Specimen Description BLOOD LEFT ARM  Final   Special Requests   Final    BOTTLES DRAWN AEROBIC ONLY Blood Culture adequate volume   Culture   Final    NO GROWTH 5 DAYS Performed at [facility], [address], [place], [place] [address]    Report Status 02/09/2019 FINAL  Final  Culture, Urine     Status: None   Collection Time: 02/04/19  1:54 PM  Result Value Ref Range Status   Specimen Description URINE, CLEAN CATCH  Final   Special Requests NONE  Final   Culture   Final    NO GROWTH Performed at [facility], [address], [place], [place] [address]    Report Status 02/05/2019 FINAL  Final    RADIOLOGY STUDIES/RESULTS: Mr Lumbar Spine W Wo Contrast  Result Date: 02/04/2019 CLINICAL DATA:  Coccygeal wound infection. EXAM: MRI LUMBAR SPINE WITHOUT AND WITH CONTRAST TECHNIQUE: Multiplanar and multiecho pulse sequences of the lumbar spine were obtained without and with intravenous contrast. CONTRAST:  7 mL Gadavist COMPARISON:  None. FINDINGS: Segmentation: Normal. The lowest disc space is considered to be L5-S1. Alignment:  Normal Vertebrae: There is no bone marrow edema. No compression fracture. No abnormal contrast enhancement. Normal T1-weighted signal is preserved throughout the visualized spine and  sacrum. The coccyx is outside of the field of view. Conus medullaris and cauda equina: The conus medullaris terminates at the L1 level. The cauda equina and conus medullaris are both normal. Paraspinal and other soft tissues: The visualized retroperitoneal organs and paraspinal soft tissues are normal. Disc levels: Sagittal plane imaging includes the T12-L1 disc level through the upper sacrum, with axial imaging of the L1-2 to L5-S1 disc levels. T12-L1: Normal. L1-2: Normal. L2-3: Disc desiccation with small bulge and superimposed central protrusion. No central spinal canal stenosis. No neural foraminal stenosis. Normal facets. L3-4:  Disc desiccation and small central protrusion. No spinal canal or neural foraminal stenosis. L4-5: Minimal disc bulge no spinal canal or neural foraminal stenosis. L5-S1: Central/right subarticular disc protrusion with narrowing of the right lateral recess. No central spinal canal stenosis or neural foraminal stenosis. The visualized portion of the sacrum is normal. IMPRESSION: 1. No evidence of active osteomyelitis of the lumbar spine or sacrum. Please note that the coccyx is outside the field of view. 2. No fluid collection or other focal abnormality of the soft tissues dorsal to the spine. 3. Multilevel mild degenerative disc disease. Mild narrowing of the right L5-S1 lateral recess could cause radicular symptoms in a right S1 distribution. Electronically Signed   By: Deatra Robinson M.D.   On: 02/04/2019 23:06   Dg Chest Port 1 View  Result Date: 02/04/2019 CLINICAL DATA:  Dyspnea. EXAM: PORTABLE CHEST 1 VIEW COMPARISON:  One-view chest x-ray 02/03/2019 FINDINGS: The heart is mildly enlarged. Mild pulmonary vascular congestion is stable. Elevation of the right hemidiaphragm is again noted. There is some volume loss on the right with associated atelectasis. No significant airspace consolidation is present. A small right effusion is not excluded. IMPRESSION: 1. Mild pulmonary  vascular congestion without frank edema. 2. Similar appearance of right basilar atelectasis. Electronically Signed   By: Marin Roberts M.D.   On: 02/04/2019 08:31   Dg Chest Port 1 View  Result Date: 02/03/2019 CLINICAL DATA:  Shortness of breath EXAM: PORTABLE CHEST 1 VIEW COMPARISON:  01/19/2019 FINDINGS: Cardiac shadows within normal limits. Elevation of the right hemidiaphragm is noted with right basilar atelectasis. No other focal infiltrate is seen. Gastrostomy tube is noted within the stomach. IMPRESSION: Mild right basilar atelectasis. Electronically Signed   By: Alcide Clever M.D.   On: 02/03/2019 08:24   Dg Chest Port 1 View  Result Date: 01/19/2019 CLINICAL DATA:  Acute respiratory failure EXAM: PORTABLE CHEST 1 VIEW COMPARISON:  01/15/2019 FINDINGS: Cardiac shadow is stable. Left lung remains clear. Endotracheal tube is noted in satisfactory position. Right basilar atelectasis is noted increased from the prior exam. No pneumothorax is seen. No acute bony abnormality is noted. IMPRESSION: Increasing right basilar atelectasis. Electronically Signed   By: Alcide Clever M.D.   On: 01/19/2019 07:23   Dg Chest Port 1 View  Result Date: 01/15/2019 CLINICAL DATA:  Pulmonary insufficiency. SOB. EXAM: PORTABLE CHEST 1 VIEW COMPARISON:  01/12/2019. FINDINGS: Unchanged cardiomediastinal silhouette. Mild increased perihilar markings without consolidation or edema. No bony abnormality. Unchanged tracheostomy. No acute osseous findings. IMPRESSION: Improved aeration.  Mild increased perihilar markings Electronically Signed   By: Elsie Stain M.D.   On: 01/15/2019 07:06   Dg Swallowing Func-speech Pathology  Result Date: 02/04/2019 Objective Swallowing Evaluation: Type of Study: MBS-Modified Barium Swallow Study  Patient Details Name: Asif Muchow. MRN: [id number] Date of Birth: 07/23/1956 Today's Date: 02/04/2019 Time: SLP Start Time (ACUTE ONLY): 0920 -SLP Stop Time (ACUTE ONLY): 0931 SLP  Time Calculation (min) (ACUTE ONLY): 11 min Past Medical History: Past Medical History: Diagnosis Date  Coronary artery disease   Diabetes mellitus without complication (HCC)   Hyperlipidemia   Hypertension   Osteomyelitis (HCC) 03/25/2017  RT FOOT Past Surgical History: Past Surgical History: Procedure Laterality Date  AMPUTATION Right 03/27/2017  Procedure: 1st Ray Amputation Right Foot;  Surgeon: Nadara Mustard, MD;  Location: [facility] OR;  Service: Orthopedics;  Laterality: Right;  APPENDECTOMY    BELOW KNEE LEG AMPUTATION Left   CARDIAC CATHETERIZATION    CORONARY STENT  INTERVENTION  2005  Great toe amputation right.    I&D EXTREMITY Left 12/30/2015  Procedure: IRRIGATION AND DEBRIDEMENT LEFT FOOT, TRANSMETATARSAL AMPUTATION WITH APPLICATION OF ANTIBIOTIC BEADS AND WOUND VAC;  Surgeon: Nadara Mustard, MD;  Location: [facility] OR;  Service: Orthopedics;  Laterality: Left;  IR GASTROSTOMY TUBE MOD SED  12/23/2018  TONSILLECTOMY    TRACHEOSTOMY  11/2018 HPI: 63 year old male coming in with fever cough chest pain and shortness of breath was found to have influenza A. PMH: diabetes mellitus, hypertension.  Intubated multiple times 1/3 to 1/16 with trach placed 1/16. Repeat MBS on 3/28 for possible initiation of po's. This is pt's 3rd MBS. Plan to repeat to determine upgrade initially; on 3/10 concern pt may have aspirated and made NPO.   Subjective: pt is alert and cooperative but needs encouragement Assessment / Plan / Recommendation CHL IP CLINICAL IMPRESSIONS 02/04/2019 Clinical Impression Pt has been decannulated since 3/9 with evidence of incomplete stoma closure indicated by breathy vocal quality. He continues to exhibit moderate-severe pharyngeal dysphagia noteably due to incompetent laryngeal closure and poor retroflexion of epiglottis. Honey thick penetrated vestibule with initial swallow of MBS and expelled with cues for cough and was not penetrated with subsequent trial. Nectar thick entered vestibule with  penetration aspiration score (PAS) of 3, 5 & 7 (did not exit trachea with cough). Chin tuck actually facilitated entrance of barium into trachea and swallow with head in neutral applying pressure to gauze/stoma to increase pressure was ineffective. Mild-mod amount of vallecular and pyriform sinus residue not significantly improved. Recommend pt resume po's of honey thick liquids, texture of Dys 2, multiple swallows and hard coughs/throat clear after every other bite/sip. Pt should continue to receive ST at skilled nursing facility for continued intervention and repeat MBS when appropriate (not before 6-8 weeks). Will continue to follow while here.  SLP Visit Diagnosis Dysphagia, pharyngeal phase (R13.13) Attention and concentration deficit following -- Frontal lobe and executive function deficit following -- Impact on safety and function Severe aspiration risk   CHL IP TREATMENT RECOMMENDATION 02/04/2019 Treatment Recommendations Therapy as outlined in treatment plan below   Prognosis 02/04/2019 Prognosis for Safe Diet Advancement Good Barriers to Reach Goals -- Barriers/Prognosis Comment -- CHL IP DIET RECOMMENDATION 02/04/2019 SLP Diet Recommendations Dysphagia 2 (Fine chop) solids;Honey thick liquids Liquid Administration via Cup;No straw Medication Administration Whole meds with puree Compensations Slow rate;Small sips/bites;Multiple dry swallows after each bite/sip;Hard cough after swallow;Clear throat intermittently Postural Changes Seated upright at 90 degrees;Remain semi-upright after after feeds/meals (Comment)   CHL IP OTHER RECOMMENDATIONS 02/04/2019 Recommended Consults -- Oral Care Recommendations Oral care BID Other Recommendations --   CHL IP FOLLOW UP RECOMMENDATIONS 02/04/2019 Follow up Recommendations Skilled Nursing facility   [facility] IP FREQUENCY AND DURATION 02/04/2019 Speech Therapy Frequency (ACUTE ONLY) min 2x/week Treatment Duration 2 weeks      CHL IP ORAL PHASE 02/04/2019 Oral Phase WFL Oral -  Pudding Teaspoon -- Oral - Pudding Cup -- Oral - Honey Teaspoon -- Oral - Honey Cup WFL Oral - Nectar Teaspoon -- Oral - Nectar Cup WFL Oral - Nectar Straw -- Oral - Thin Teaspoon -- Oral - Thin Cup NT Oral - Thin Straw -- Oral - Puree NT Oral - Mech Soft -- Oral - Regular NT Oral - Multi-Consistency -- Oral - Pill -- Oral Phase - Comment --  CHL IP PHARYNGEAL PHASE 02/04/2019 Pharyngeal Phase Impaired Pharyngeal- Pudding Teaspoon -- Pharyngeal -- Pharyngeal- Pudding Cup -- Pharyngeal -- Pharyngeal- Honey Teaspoon NT Pharyngeal --  Pharyngeal- Honey Cup Penetration/Aspiration during swallow;Pharyngeal residue - valleculae;Pharyngeal residue - pyriform;Reduced airway/laryngeal closure Pharyngeal Material enters airway, remains ABOVE vocal cords and not ejected out Pharyngeal- Nectar Teaspoon NT Pharyngeal -- Pharyngeal- Nectar Cup Penetration/Aspiration during swallow;Reduced airway/laryngeal closure;Pharyngeal residue - valleculae;Pharyngeal residue - pyriform Pharyngeal Material enters airway, passes BELOW cords and not ejected out despite cough attempt by patient;Material enters airway, CONTACTS cords and not ejected out Pharyngeal- Nectar Straw -- Pharyngeal -- Pharyngeal- Thin Teaspoon NT Pharyngeal -- Pharyngeal- Thin Cup NT Pharyngeal -- Pharyngeal- Thin Straw -- Pharyngeal -- Pharyngeal- Puree NT Pharyngeal -- Pharyngeal- Mechanical Soft -- Pharyngeal -- Pharyngeal- Regular NT Pharyngeal -- Pharyngeal- Multi-consistency -- Pharyngeal -- Pharyngeal- Pill -- Pharyngeal -- Pharyngeal Comment --  CHL IP CERVICAL ESOPHAGEAL PHASE 02/04/2019 Cervical Esophageal Phase WFL Pudding Teaspoon -- Pudding Cup -- Honey Teaspoon -- Honey Cup -- Nectar Teaspoon -- Nectar Cup -- Nectar Straw -- Thin Teaspoon -- Thin Cup -- Thin Straw -- Puree -- Mechanical Soft -- Regular -- Multi-consistency -- Pill -- Cervical Esophageal Comment -- Royce Macadamia 02/04/2019, 10:23 AM Breck Coons Lonell Face[employer] Careers information officer Pager [phone] Office [phone]              Dg Swallowing Func-speech Pathology  Result Date: 01/30/2019 Objective Swallowing Evaluation: Type of Study: MBS-Modified Barium Swallow Study  Patient Details Name: Story Vanvranken. MRN: [id number] Date of Birth: 08/16/1956 Today's Date: 01/30/2019 Time: SLP Start Time (ACUTE ONLY): 1328 -SLP Stop Time (ACUTE ONLY): 1348 SLP Time Calculation (min) (ACUTE ONLY): 20 min Past Medical History: Past Medical History: Diagnosis Date  Coronary artery disease   Diabetes mellitus without complication (HCC)   Hyperlipidemia   Hypertension   Osteomyelitis (HCC) 03/25/2017  RT FOOT Past Surgical History: Past Surgical History: Procedure Laterality Date  AMPUTATION Right 03/27/2017  Procedure: 1st Ray Amputation Right Foot;  Surgeon: Nadara Mustard, MD;  Location: [facility] OR;  Service: Orthopedics;  Laterality: Right;  APPENDECTOMY    BELOW KNEE LEG AMPUTATION Left   CARDIAC CATHETERIZATION    CORONARY STENT INTERVENTION  2005  Great toe amputation right.    I&D EXTREMITY Left 12/30/2015  Procedure: IRRIGATION AND DEBRIDEMENT LEFT FOOT, TRANSMETATARSAL AMPUTATION WITH APPLICATION OF ANTIBIOTIC BEADS AND WOUND VAC;  Surgeon: Nadara Mustard, MD;  Location: [facility] OR;  Service: Orthopedics;  Laterality: Left;  IR GASTROSTOMY TUBE MOD SED  12/23/2018  TONSILLECTOMY    TRACHEOSTOMY  11/2018 HPI: 63 year old male coming in with fever cough chest pain and shortness of breath was found to have influenza A. PMH: diabetes mellitus, hypertension.  Intubated multiple times 1/3 to 1/16 with trach placed 1/16. Repeat MBS on 3/28 for possible initiation of po's.  Subjective: pt is alert and cooperative but needs encouragement Assessment / Plan / Recommendation CHL IP CLINICAL IMPRESSIONS 01/30/2019 Clinical Impression Pt's trach was capped this morning and during MBS with normal oropharyngeal pressures. Swallow function has improved from prior study despite silent aspiration with thin  and penetration of nectar and honey. Laryngeal excursion, pharyngeal contraction and epiglottic inversion are not of normal strength and ROM however improved from last week. Incomplete closure led to penetration with thin, nectar and only flash with honey thick x 1. Chin tuck did not prevent penetration (silent) with nectar and vallecular and pyriform sinus residue present and was mildly increased with honey thick. Verbal cues to perform second swallow reduced volume. Puree was not penetrated this study. Recommend Dys 2 given mild oral residue, impulsivity and deconditioning. Honey thick  liquids recommended with two swallows, intermittent throat clears and pills whole in applesauce. ST will continue to follow for safety and upgrade of po textures. SLP Visit Diagnosis Dysphagia, oropharyngeal phase (R13.12) Attention and concentration deficit following -- Frontal lobe and executive function deficit following -- Impact on safety and function Moderate aspiration risk;Severe aspiration risk   CHL IP TREATMENT RECOMMENDATION 01/30/2019 Treatment Recommendations Therapy as outlined in treatment plan below   Prognosis 01/30/2019 Prognosis for Safe Diet Advancement Good Barriers to Reach Goals -- Barriers/Prognosis Comment -- CHL IP DIET RECOMMENDATION 01/30/2019 SLP Diet Recommendations Honey thick liquids;Dysphagia 2 (Fine chop) solids Liquid Administration via Cup;No straw Medication Administration Whole meds with puree Compensations Slow rate;Small sips/bites;Multiple dry swallows after each bite/sip;Clear throat intermittently Postural Changes Seated upright at 90 degrees   CHL IP OTHER RECOMMENDATIONS 01/30/2019 Recommended Consults -- Oral Care Recommendations Oral care BID Other Recommendations --   CHL IP FOLLOW UP RECOMMENDATIONS 01/30/2019 Follow up Recommendations (No Data)   CHL IP FREQUENCY AND DURATION 01/30/2019 Speech Therapy Frequency (ACUTE ONLY) min 2x/week Treatment Duration 2 weeks      CHL IP ORAL PHASE 01/30/2019  Oral Phase Impaired Oral - Pudding Teaspoon -- Oral - Pudding Cup -- Oral - Honey Teaspoon -- Oral - Honey Cup Lingual/palatal residue Oral - Nectar Teaspoon -- Oral - Nectar Cup Lingual/palatal residue Oral - Nectar Straw -- Oral - Thin Teaspoon -- Oral - Thin Cup -- Oral - Thin Straw -- Oral - Puree Lingual/palatal residue Oral - Mech Soft -- Oral - Regular WFL Oral - Multi-Consistency -- Oral - Pill -- Oral Phase - Comment --  CHL IP PHARYNGEAL PHASE 01/30/2019 Pharyngeal Phase Impaired Pharyngeal- Pudding Teaspoon -- Pharyngeal -- Pharyngeal- Pudding Cup -- Pharyngeal -- Pharyngeal- Honey Teaspoon NT Pharyngeal -- Pharyngeal- Honey Cup Pharyngeal residue - valleculae;Pharyngeal residue - pyriform;Reduced epiglottic inversion;Penetration/Aspiration during swallow;Reduced airway/laryngeal closure Pharyngeal Material enters airway, remains ABOVE vocal cords then ejected out Pharyngeal- Nectar Teaspoon NT Pharyngeal -- Pharyngeal- Nectar Cup Penetration/Aspiration during swallow;Reduced airway/laryngeal closure;Reduced epiglottic inversion;Pharyngeal residue - valleculae;Pharyngeal residue - pyriform;Compensatory strategies attempted (with notebox) Pharyngeal Material enters airway, remains ABOVE vocal cords and not ejected out Pharyngeal- Nectar Straw -- Pharyngeal -- Pharyngeal- Thin Teaspoon NT Pharyngeal -- Pharyngeal- Thin Cup Penetration/Aspiration during swallow;Pharyngeal residue - valleculae;Pharyngeal residue - pyriform;Moderate aspiration;Trace aspiration;Reduced airway/laryngeal closure;Reduced epiglottic inversion Pharyngeal Material enters airway, passes BELOW cords without attempt by patient to eject out (silent aspiration) Pharyngeal- Thin Straw -- Pharyngeal -- Pharyngeal- Puree Pharyngeal residue - pyriform;Pharyngeal residue - valleculae;Reduced epiglottic inversion Pharyngeal Material does not enter airway Pharyngeal- Mechanical Soft -- Pharyngeal -- Pharyngeal- Regular WFL Pharyngeal --  Pharyngeal- Multi-consistency -- Pharyngeal -- Pharyngeal- Pill -- Pharyngeal -- Pharyngeal Comment --  CHL IP CERVICAL ESOPHAGEAL PHASE 01/30/2019 Cervical Esophageal Phase WFL Pudding Teaspoon -- Pudding Cup -- Honey Teaspoon -- Honey Cup -- Nectar Teaspoon -- Nectar Cup -- Nectar Straw -- Thin Teaspoon -- Thin Cup -- Thin Straw -- Puree -- Mechanical Soft -- Regular -- Multi-consistency -- Pill -- Cervical Esophageal Comment -- Royce Macadamia 01/30/2019, 2:53 PM Breck Coons Lit[place] M.Ed Sports administrator Pager [phone] Office [phone]              Dg Swallowing Func-speech Pathology  Result Date: 01/23/2019 Objective Swallowing Evaluation: Type of Study: MBS-Modified Barium Swallow Study  Patient Details Name: Renso Swett. MRN: [id number] Date of Birth: Apr 11, 1956 Today's Date: 01/23/2019 Time: SLP Start Time (ACUTE ONLY): 1254 -SLP Stop Time (ACUTE ONLY): 1308 SLP Time  Calculation (min) (ACUTE ONLY): 14 min Past Medical History: Past Medical History: Diagnosis Date  Coronary artery disease   Diabetes mellitus without complication (HCC)   Hyperlipidemia   Hypertension   Osteomyelitis (HCC) 03/25/2017  RT FOOT Past Surgical History: Past Surgical History: Procedure Laterality Date  AMPUTATION Right 03/27/2017  Procedure: 1st Ray Amputation Right Foot;  Surgeon: Nadara Mustard, MD;  Location: [facility] OR;  Service: Orthopedics;  Laterality: Right;  APPENDECTOMY    BELOW KNEE LEG AMPUTATION Left   CARDIAC CATHETERIZATION    CORONARY STENT INTERVENTION  2005  Great toe amputation right.    I&D EXTREMITY Left 12/30/2015  Procedure: IRRIGATION AND DEBRIDEMENT LEFT FOOT, TRANSMETATARSAL AMPUTATION WITH APPLICATION OF ANTIBIOTIC BEADS AND WOUND VAC;  Surgeon: Nadara Mustard, MD;  Location: [facility] OR;  Service: Orthopedics;  Laterality: Left;  IR GASTROSTOMY TUBE MOD SED  12/23/2018  TONSILLECTOMY    TRACHEOSTOMY  11/2018 HPI: 63 year old male coming in with fever cough chest pain and  shortness of breath was found to have influenza A. PMH: diabetes mellitus, hypertension.  Intubated multiple times 1/3 to 1/16 with trach placed 1/16.   Subjective: pt is alert and cooperative but needs encouragement Assessment / Plan / Recommendation CHL IP CLINICAL IMPRESSIONS 01/23/2019 Clinical Impression Pt has a moderate pharyngeal dysphagia due to reduced strength and airway closure s/p prolonged (and repeated) intubations. His timing for swallow trigger is good, but he has reduced hyolaryngeal movement, pharyngeal squeeze, epiglottic inversion, and tonge base retraction. Thin and nectar thick liquids enter the laryngeal vestibule during the swallow because of his incomplete closure, with penetration intermittently reaching the true vocal folds without spontaneous cough elicited. Multiple cued coughs were required to eject penetrates. Honey thick liquids and purees left more residue in the valleculae and pyriform sinuses, with intermittent penetration after the swallow from the residual material. Recommend he remain NPO except for initiating small amounts of purees and spoonfuls of thickened liquids during SLP visits only to facilitate restrengthening. Pt may also benefit from EMST and other exercises to progress strength of cough and pharyngeal musculature.   SLP Visit Diagnosis Dysphagia, pharyngeal phase (R13.13) Attention and concentration deficit following -- Frontal lobe and executive function deficit following -- Impact on safety and function Moderate aspiration risk   CHL IP TREATMENT RECOMMENDATION 01/23/2019 Treatment Recommendations Therapy as outlined in treatment plan below   Prognosis 01/23/2019 Prognosis for Safe Diet Advancement Good Barriers to Reach Goals -- Barriers/Prognosis Comment -- CHL IP DIET RECOMMENDATION 01/23/2019 SLP Diet Recommendations NPO Liquid Administration via -- Medication Administration Via alternative means Compensations -- Postural Changes --   CHL IP OTHER RECOMMENDATIONS  01/23/2019 Recommended Consults -- Oral Care Recommendations Oral care QID Other Recommendations Have oral suction available   CHL IP FOLLOW UP RECOMMENDATIONS 01/23/2019 Follow up Recommendations Inpatient Rehab   CHL IP FREQUENCY AND DURATION 01/23/2019 Speech Therapy Frequency (ACUTE ONLY) min 2x/week Treatment Duration 2 weeks      CHL IP ORAL PHASE 01/23/2019 Oral Phase WFL Oral - Pudding Teaspoon -- Oral - Pudding Cup -- Oral - Honey Teaspoon -- Oral - Honey Cup -- Oral - Nectar Teaspoon -- Oral - Nectar Cup -- Oral - Nectar Straw -- Oral - Thin Teaspoon -- Oral - Thin Cup -- Oral - Thin Straw -- Oral - Puree -- Oral - Mech Soft -- Oral - Regular -- Oral - Multi-Consistency -- Oral - Pill -- Oral Phase - Comment --  CHL IP PHARYNGEAL PHASE 01/23/2019 Pharyngeal Phase Impaired Pharyngeal- Pudding  Teaspoon -- Pharyngeal -- Pharyngeal- Pudding Cup -- Pharyngeal -- Pharyngeal- Honey Teaspoon Reduced pharyngeal peristalsis;Reduced epiglottic inversion;Reduced anterior laryngeal mobility;Reduced laryngeal elevation;Reduced airway/laryngeal closure;Reduced tongue base retraction;Pharyngeal residue - valleculae;Pharyngeal residue - pyriform Pharyngeal -- Pharyngeal- Honey Cup -- Pharyngeal -- Pharyngeal- Nectar Teaspoon Reduced pharyngeal peristalsis;Reduced epiglottic inversion;Reduced anterior laryngeal mobility;Reduced laryngeal elevation;Reduced airway/laryngeal closure;Reduced tongue base retraction;Pharyngeal residue - valleculae;Pharyngeal residue - pyriform;Penetration/Aspiration during swallow Pharyngeal Material enters airway, remains ABOVE vocal cords and not ejected out Pharyngeal- Nectar Cup Reduced pharyngeal peristalsis;Reduced epiglottic inversion;Reduced anterior laryngeal mobility;Reduced laryngeal elevation;Reduced airway/laryngeal closure;Reduced tongue base retraction;Pharyngeal residue - valleculae;Pharyngeal residue - pyriform;Penetration/Aspiration during swallow Pharyngeal Material enters airway,  remains ABOVE vocal cords and not ejected out Pharyngeal- Nectar Straw -- Pharyngeal -- Pharyngeal- Thin Teaspoon Reduced pharyngeal peristalsis;Reduced epiglottic inversion;Reduced anterior laryngeal mobility;Reduced laryngeal elevation;Reduced airway/laryngeal closure;Reduced tongue base retraction;Penetration/Aspiration during swallow Pharyngeal Material enters airway, CONTACTS cords and not ejected out Pharyngeal- Thin Cup -- Pharyngeal -- Pharyngeal- Thin Straw -- Pharyngeal -- Pharyngeal- Puree Reduced pharyngeal peristalsis;Reduced epiglottic inversion;Reduced anterior laryngeal mobility;Reduced laryngeal elevation;Reduced airway/laryngeal closure;Reduced tongue base retraction;Pharyngeal residue - valleculae;Pharyngeal residue - pyriform;Penetration/Apiration after swallow Pharyngeal Material enters airway, remains ABOVE vocal cords and not ejected out Pharyngeal- Mechanical Soft -- Pharyngeal -- Pharyngeal- Regular -- Pharyngeal -- Pharyngeal- Multi-consistency -- Pharyngeal -- Pharyngeal- Pill -- Pharyngeal -- Pharyngeal Comment --  CHL IP CERVICAL ESOPHAGEAL PHASE 01/23/2019 Cervical Esophageal Phase WFL Pudding Teaspoon -- Pudding Cup -- Honey Teaspoon -- Honey Cup -- Nectar Teaspoon -- Nectar Cup -- Nectar Straw -- Thin Teaspoon -- Thin Cup -- Thin Straw -- Puree -- Mechanical Soft -- Regular -- Multi-consistency -- Pill -- Cervical Esophageal Comment -- Virl Axe Nix 01/23/2019, 2:32 PM  [name], M.A. CCC-SLP Acute Rehabilitation Services Pager [phone] Office [phone]               LOS: 77 days   Jeoffrey Massed, MD  [facility]  If 7PM-7AM, please contact night-coverage  Please page via [email or web address]  Go to [email or web address] and use [place]'s universal password to access. If you do not have the password, please contact the hospital operator.  Locate the [facility] provider you are looking for under [facility] and page to a number that you can be directly reached. If you  still have difficulty reaching the provider, please page the [facility] (Director on Call) for the Hospitalists listed on amion for assistance.  02/13/2019, 2:29 PM

## 2019-02-14 DIAGNOSIS — R509 Fever, unspecified: Secondary | ICD-10-CM

## 2019-02-14 LAB — GLUCOSE, CAPILLARY
Glucose-Capillary: 116 mg/dL — ABNORMAL HIGH (ref 70–99)
Glucose-Capillary: 180 mg/dL — ABNORMAL HIGH (ref 70–99)
Glucose-Capillary: 161 mg/dL — ABNORMAL HIGH (ref 70–99)
Glucose-Capillary: 140 mg/dL — ABNORMAL HIGH (ref 70–99)

## 2019-02-14 NOTE — Progress Notes (Signed)
Physical Therapy Wound Treatment Patient Details  Name: Antonio [name]. MRN: [id number] Date of Birth: [birth year]  Today's Date: 02/14/2019 Time: 0950-1030 Time Calculation (min): 40 min  Subjective  Subjective: Feel like I need more meds c/o increased pain with treatment today. Patient and Family Stated Goals: "go home." Prior Treatments: dressing changes  Pain Score: Pain Score: 3   Wound Assessment  Pressure Injury 12/01/18 Unstageable - Full thickness tissue loss in which the base of the ulcer is covered by slough (yellow, tan, gray, green or brown) and/or eschar (tan, brown or black) in the wound bed. sacrum (Active)  Wound Image   02/10/2019 11:27 AM  Dressing Type ABD;Gauze (Comment);Moist to dry;Barrier Film (skin prep);Impregnated gauze (petrolatum) 02/14/2019 10:00 AM  Dressing Clean;Dry;Intact 02/14/2019 10:00 AM  Dressing Change Frequency Twice a day 02/14/2019 10:00 AM  State of Healing Early/partial granulation 02/13/2019 10:51 AM  Site / Wound Assessment Red;Yellow;Pink 02/14/2019 10:00 AM  % Wound base Red or Granulating 55% 02/14/2019 10:00 AM  % Wound base Yellow/Fibrinous Exudate 45% 02/14/2019 10:00 AM  % Wound base Black/Eschar 0% 02/14/2019 10:00 AM  % Wound base Other/Granulation Tissue (Comment) 0% 02/14/2019 10:00 AM  Peri-wound Assessment Erythema (blanchable);Pink 02/14/2019 10:00 AM  Wound Length (cm) 7.5 cm 02/14/2019 10:00 AM  Wound Width (cm) 3 cm 02/14/2019 10:00 AM  Wound Depth (cm) 2 cm 02/14/2019 10:00 AM  Wound Surface Area (cm^2) 22.5 cm^2 02/14/2019 10:00 AM  Wound Volume (cm^3) 45 cm^3 02/14/2019 10:00 AM  Tunneling (cm) 4.5 02/10/2019 11:27 AM  Undermining (cm) 7.5 02/14/2019 10:00 AM  Margins Unattached edges (unapproximated) 02/14/2019 10:00 AM  Drainage Amount Moderate 02/14/2019 10:00 AM  Drainage Description Purulent;Green;Odor 02/14/2019 10:00 AM  Treatment Cleansed;Debridement (Selective);Hydrotherapy (Pulse lavage);Packing (Impregnated strip);Tape  changed 02/14/2019 10:00 AM      Hydrotherapy Pulsed lavage therapy - wound location: sacrum Pulsed Lavage with Suction (psi): 12 psi Pulsed Lavage with Suction - Normal Saline Used: 1000 mL Pulsed Lavage Tip: Tip with splash shield Selective Debridement Selective Debridement - Location: sacrum Selective Debridement - Tools Used: Forceps;Scissors Selective Debridement - Tissue Removed: necrotic tissue   Wound Assessment and Plan  Wound Therapy - Assess/Plan/Recommendations Wound Therapy - Clinical Statement: Continues with drainage that penetrates doubled ABD and soakes two bed pads with greenish color despite now third day of Dakin's solution.  Re-measured due to noting new opening on L buttocks that likely communicates with 7.5 cm of undermining from wound on R buttock.  Feel patient may need further surgical evaluation.  PT to follow for hydrotherapy as per plan.  Wound Therapy - Functional Problem List: decr mobility Factors Delaying/Impairing Wound Healing: Diabetes Mellitus;Immobility;Multiple medical problems Hydrotherapy Plan: Debridement;Dressing change;Patient/family education;Pulsatile lavage with suction Wound Therapy - Frequency: 6X / week Wound Therapy - Current Recommendations: Surgery consult(re-consult) Wound Therapy - Follow Up Recommendations: Skilled nursing facility Wound Plan: see above  Wound Therapy Goals- Improve the function of patient's integumentary system by progressing the wound(s) through the phases of wound healing (inflammation - proliferation - remodeling) by: Decrease Necrotic Tissue to: 40 Decrease Necrotic Tissue - Progress: Progressing toward goal Increase Granulation Tissue to: 60 Increase Granulation Tissue - Progress: Progressing toward goal  Goals will be updated until maximal potential achieved or discharge criteria met.  Discharge criteria: when goals achieved, discharge from hospital, MD decision/surgical intervention, no progress towards  goals, refusal/missing three consecutive treatments without notification or medical reason.  GP     Reginia Naas 02/14/2019, 11:08 AM  Magda Kiel, PT  Acute Rehabilitation Services [phone] 02/14/2019

## 2019-02-14 NOTE — Progress Notes (Signed)
PROGRESS NOTE        PATIENT DETAILS Name: Antonio [name]. Age: 63 y.o. Sex: male Date of Birth: 03/28/1956 Admit Date: 11/28/2018 Admitting Physician Reyes Ivan, MD ZOX:WRUE, Orie Rout, NP  Brief Narrative: Patient is a 63 y.o. male admitted on 1/3 with influenza A causing acute hypoxic respiratory failure, cardiogenic shock-required prolonged mechanical ventilation-is currently s/p tracheostomy and PEG tube placement.  Significant events: 1/03  Admit, Influenza A positive 1/06  Self extubated  1/07  Re-intubated  1/08  Self-extubated  1/16 tracheostomy 1/21  Trach revision to XLT distal 1/28 PEG tube 2/03  Weaned 15 minutes  2/11  ATC  2/14  Trach changed to #4, mucus plugging >back to #5 XLT 2/15  Failed ATC with increased WOB 2/16  Failed ATC with increased WOB  2/21 back on pressure support 2/24 back on TC  01/27/19 - 5 XLT cuffless trach  01/29/2019: Size 4 cuff less tracheostomy placed 01/30/2019: Capping trials initiated 02/02/2019: Tracheostomy decannulated  Subjective: Lying comfortably in bed-denies any chest pain or shortness of breath.  Assessment/Plan: Fever: Afebrile over the past few days-chest x-ray without any infiltrate, blood culture and urine culture on 3/11 negative.  Currently off all antimicrobial therapy-continue to monitor.  Stage IV sacral/coccygeal ulceration: Continue with hydrotherapy while inpatient-once discharge-General surgery recommending Dakins twice daily for 3 days. Patient will need twice daily wet to dry dressing changes with normal saline dampened Kerlix packed in the wound (make sure to get areas of tunneling) with dry 4x4 over this and abd pad covering wound.   Acute hypoxic respiratory failure secondary to influenza A, pseudomonal/Corynebacterium pneumonia: Prolonged hospitalization requiring prolonged ventilator support-subsequently tracheostomy on 1/16.  Currently decannulated on 3/9-Per last PCCM  note-if the wound does not close in 2 weeks-may need evaluation for tracheocutaneous fistula.  Acute systolic heart failure (EF 20-25% on 1/4, EF 60-65% by TTE on 1/29): Compensated-continue beta-blocker.  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  Cardiology recommending further work-up to be done in the outpatient setting.  Dysphagia: Speech therapy following-recommendations were for a dysphagia 2 diet-however patient noncompliant-and as a result SLP has upgraded to a regular diet.  No signs of aspiration at this point.  Metabolic encephalopathy: Resolved.  Etiology felt to be secondary to hypoxia/pneumonia.  DM-2: CBG stable continue Levemir 25 units twice daily and SSI.  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  Chronic pain syndrome: Continue Nucynta and as needed oxycodone  Debility/deconditioning: Secondary to acute illness-awaiting SNF bed  DVT Prophylaxis: Prophylactic Lovenox   Code Status: Full code  Family Communication: None at bedside  Disposition Plan: Remain inpatient-SNF on discharge  Antimicrobial agents: Anti-infectives (From admission, onward)   Start     Dose/Rate Route Frequency Ordered Stop   02/04/19 1300  vancomycin (VANCOCIN) 1,250 mg in sodium chloride 0.9 % 250 mL IVPB  Status:  Discontinued     1,250 mg 166.7 mL/hr over 90 Minutes Intravenous Every 12 hours 02/04/19 1200 02/06/19 1002   02/04/19 1200  metroNIDAZOLE (FLAGYL) IVPB 500 mg  Status:  Discontinued     500 mg 100 mL/hr over 60 Minutes Intravenous Every 8 hours 02/04/19 1146 02/06/19 1002   02/04/19 0930  ceFEPIme (MAXIPIME) 2 g in sodium chloride 0.9 % 100 mL IVPB     2 g 200 mL/hr over  30 Minutes Intravenous Every 8 hours 02/04/19 0926 02/08/19 2359   02/03/19 1200  meropenem (MERREM) 1 g in sodium chloride 0.9 % 100 mL IVPB  Status:  Discontinued     1 g 200 mL/hr over 30 Minutes Intravenous Every 8 hours 02/03/19 1054 02/04/19 0924     01/19/19 1400  cefTAZidime (FORTAZ) 2 g in sodium chloride 0.9 % 100 mL IVPB     2 g 200 mL/hr over 30 Minutes Intravenous Every 8 hours 01/19/19 1120 01/24/19 2359   01/15/19 1500  cefTAZidime (FORTAZ) 2 g in sodium chloride 0.9 % 100 mL IVPB     2 g 200 mL/hr over 30 Minutes Intravenous Every 8 hours 01/15/19 1448 01/19/19 0542   01/14/19 1400  colistimethate ((COLYMYCIN-M)) nebulized solution 150 mg  Status:  Discontinued     150 mg Inhalation 3 times daily 01/14/19 1142 01/19/19 1119   01/14/19 1300  meropenem (MERREM) 1 g in sodium chloride 0.9 % 100 mL IVPB  Status:  Discontinued     1 g 200 mL/hr over 30 Minutes Intravenous Every 8 hours 01/14/19 1235 01/15/19 1448   12/23/18 1506  vancomycin (VANCOCIN) 1-5 GM/200ML-% IVPB    Note to Pharmacy:  Teof[facility]   : cabinet override      12/23/18 1506 12/23/18 1515   12/20/18 2200  vancomycin (VANCOCIN) IVPB 1000 mg/200 mL premix  Status:  Discontinued     1,000 mg 200 mL/hr over 60 Minutes Intravenous Every 12 hours 12/20/18 1804 12/22/18 1413   12/18/18 0800  ceFEPIme (MAXIPIME) 1 g in sodium chloride 0.9 % 100 mL IVPB  Status:  Discontinued     1 g 200 mL/hr over 30 Minutes Intravenous Every 8 hours 12/18/18 0713 12/19/18 1201   12/17/18 0530  vancomycin (VANCOCIN) 1,500 mg in sodium chloride 0.9 % 500 mL IVPB  Status:  Discontinued     1,500 mg 250 mL/hr over 120 Minutes Intravenous Every 12 hours 12/16/18 1704 12/20/18 1804   12/16/18 1730  cefTAZidime (FORTAZ) 2 g in sodium chloride 0.9 % 100 mL IVPB  Status:  Discontinued     2 g 200 mL/hr over 30 Minutes Intravenous Every 8 hours 12/16/18 1704 12/18/18 0702   12/16/18 1730  vancomycin (VANCOCIN) 2,000 mg in sodium chloride 0.9 % 500 mL IVPB     2,000 mg 250 mL/hr over 120 Minutes Intravenous  Once 12/16/18 1704 12/16/18 2030   12/07/18 1300  erythromycin 500 mg in sodium chloride 0.9 % 100 mL IVPB  Status:  Discontinued     500 mg 100 mL/hr over 60 Minutes Intravenous  Every 6 hours 12/07/18 1118 12/16/18 1627   12/06/18 1500  vancomycin (VANCOCIN) 1,250 mg in sodium chloride 0.9 % 250 mL IVPB  Status:  Discontinued     1,250 mg 166.7 mL/hr over 90 Minutes Intravenous Every 12 hours 12/06/18 1436 12/07/18 1118   12/06/18 0100  vancomycin (VANCOCIN) 1,250 mg in sodium chloride 0.9 % 250 mL IVPB     1,250 mg 166.7 mL/hr over 90 Minutes Intravenous  Once 12/05/18 1054 12/06/18 0146   12/05/18 1200  vancomycin (VANCOCIN) 2,000 mg in sodium chloride 0.9 % 500 mL IVPB     2,000 mg 250 mL/hr over 120 Minutes Intravenous  Once 12/05/18 1054 12/05/18 1350   12/05/18 1200  cefTAZidime (FORTAZ) 1 g in sodium chloride 0.9 % 100 mL IVPB  Status:  Discontinued     1 g 200 mL/hr over 30 Minutes Intravenous Every  8 hours 12/05/18 1106 12/07/18 1118   11/30/18 2200  oseltamivir (TAMIFLU) 6 MG/ML suspension 30 mg     30 mg Per Tube 2 times daily 11/30/18 1348 12/03/18 2159   11/30/18 2200  ceFEPIme (MAXIPIME) 1 g in sodium chloride 0.9 % 100 mL IVPB  Status:  Discontinued     1 g 200 mL/hr over 30 Minutes Intravenous Every 12 hours 11/30/18 1350 12/01/18 1039   11/29/18 2200  ceFEPIme (MAXIPIME) 2 g in sodium chloride 0.9 % 100 mL IVPB  Status:  Discontinued     2 g 200 mL/hr over 30 Minutes Intravenous Every 12 hours 11/29/18 1645 11/30/18 1350   11/29/18 1100  oseltamivir (TAMIFLU) 6 MG/ML suspension 75 mg  Status:  Discontinued     75 mg Per Tube 2 times daily 11/29/18 1000 11/30/18 1348   11/28/18 2300  oseltamivir (TAMIFLU) capsule 75 mg  Status:  Discontinued     75 mg Oral 2 times daily 11/28/18 2238 11/29/18 1000   11/28/18 2030  oseltamivir (TAMIFLU) capsule 75 mg     75 mg Oral  Once 11/28/18 2029 11/28/18 2207   11/28/18 1745  vancomycin (VANCOCIN) 2,000 mg in sodium chloride 0.9 % 500 mL IVPB     2,000 mg 250 mL/hr over 120 Minutes Intravenous  Once 11/28/18 1741 11/28/18 2203   11/28/18 1745  piperacillin-tazobactam (ZOSYN) IVPB 3.375 g     3.375 g 100  mL/hr over 30 Minutes Intravenous  Once 11/28/18 1741 11/28/18 1834      Procedures: See above  CONSULTS:  cardiology, pulmonary/intensive care and general surgery  Time spent: 15 minutes-Greater than 50% of this time was spent in counseling, explanation of diagnosis, planning of further management, and coordination of care.  MEDICATIONS: Scheduled Meds:  arformoterol  15 mcg Nebulization BID   aspirin  81 mg Per Tube Daily   atorvastatin  40 mg Per Tube q1800   budesonide (PULMICORT) nebulizer solution  0.5 mg Nebulization BID   carvedilol  3.125 mg Per Tube BID WC   [START ON 02/15/2019] collagenase   Topical Daily   enoxaparin (LOVENOX) injection  40 mg Subcutaneous Q24H   famotidine  20 mg Per Tube BID   feeding supplement (PRO-STAT SUGAR FREE 64)  30 mL Oral BID   folic acid  1 mg Per Tube Daily   guaiFENesin  5 mL Per Tube Q12H   insulin aspart  0-20 Units Subcutaneous TID WC   insulin detemir  25 Units Subcutaneous BID   multivitamin with minerals  1 tablet Oral Daily   polyethylene glycol  17 g Per Tube Daily   sodium hypochlorite   Irrigation Daily   tapentadol  50 mg Oral QID   thiamine  100 mg Per Tube Daily   Continuous Infusions:  sodium chloride 10 mL/hr at 01/24/19 2000   PRN Meds:.sodium chloride, acetaminophen (TYLENOL) oral liquid 160 mg/5 mL, albuterol, alum & mag hydroxide-simeth **AND** lidocaine, bisacodyl, clonazePAM, docusate, ipratropium-albuterol, oxyCODONE, Resource ThickenUp Clear, senna   PHYSICAL EXAM: Vital signs: Vitals:   02/13/19 2202 02/14/19 0620 02/14/19 0819 02/14/19 0909  BP: 139/81 124/80  127/86  Pulse: 77 84  86  Resp: 18 18    Temp: 98.5 F (36.9 C) 98.1 F (36.7 C)    TempSrc: Oral Oral    SpO2: 90% 90% 92%   Weight:      Height:       Filed Weights   02/11/19 0547 02/12/19 0542 02/13/19 0436  Weight: 81.2 kg 87 kg 63 kg   Body mass index is 18.84 kg/m.   General appearance :Awake, alert, not  in any distress.  HEENT: Atraumatic and Normocephalic Resp:Good air entry bilaterally, no added sounds  CVS: S1 S2 regular, no murmurs.  GI: Bowel sounds present, Non tender and not distended with no gaurding, rigidity or rebound.No organomegaly Extremities: Left AKA Neurology: Nonfocal Musculoskeletal:No digital cyanosis Skin:No Rash, warm and dry  I have personally reviewed following labs and imaging studies  LABORATORY DATA: CBC: Recent Labs  Lab 02/09/19 0502 02/11/19 0320  WBC 4.8 7.1  NEUTROABS 3.2 3.9  HGB 9.0* 10.9*  HCT 31.6* 36.0*  MCV 91.3 85.7  PLT 224 271    Basic Metabolic Panel: Recent Labs  Lab 02/09/19 0502 02/11/19 0320  NA 141 136  K 3.1* 4.5  CL 100 97*  CO2 31 34*  GLUCOSE 84 180*  BUN 14 16  CREATININE 1.11 0.53*  CALCIUM 9.6 8.8*  MG 2.4 1.7    GFR: Estimated Creatinine Clearance: 86.4 mL/min (A) (by C-G formula based on SCr of 0.53 mg/dL (L)).  Liver Function Tests: Recent Labs  Lab 02/09/19 0502  AST 19  ALT 8  ALKPHOS 108  BILITOT 1.7*  PROT 5.6*  ALBUMIN 3.0*   No results for input(s): LIPASE, AMYLASE in the last 168 hours. No results for input(s): AMMONIA in the last 168 hours.  Coagulation Profile: No results for input(s): INR, PROTIME in the last 168 hours.  Cardiac Enzymes: No results for input(s): CKTOTAL, CKMB, CKMBINDEX, TROPONINI in the last 168 hours.  BNP (last 3 results) No results for input(s): PROBNP in the last 8760 hours.  HbA1C: No results for input(s): HGBA1C in the last 72 hours.  CBG: Recent Labs  Lab 02/13/19 1158 02/13/19 1638 02/13/19 2159 02/14/19 0758 02/14/19 1152  GLUCAP 131* 125* 140* 116* 180*    Lipid Profile: No results for input(s): CHOL, HDL, LDLCALC, TRIG, CHOLHDL, LDLDIRECT in the last 72 hours.  Thyroid Function Tests: No results for input(s): TSH, T4TOTAL, FREET4, T3FREE, THYROIDAB in the last 72 hours.  Anemia Panel: No results for input(s): VITAMINB12, FOLATE,  FERRITIN, TIBC, IRON, RETICCTPCT in the last 72 hours.  Urine analysis:    Component Value Date/Time   COLORURINE YELLOW 02/04/2019 1400   APPEARANCEUR HAZY (A) 02/04/2019 1400   LABSPEC 1.028 02/04/2019 1400   PHURINE 5.0 02/04/2019 1400   GLUCOSEU 150 (A) 02/04/2019 1400   HGBUR NEGATIVE 02/04/2019 1400   BILIRUBINUR NEGATIVE 02/04/2019 1400   KETONESUR NEGATIVE 02/04/2019 1400   PROTEINUR 100 (A) 02/04/2019 1400   NITRITE NEGATIVE 02/04/2019 1400   LEUKOCYTESUR NEGATIVE 02/04/2019 1400    Sepsis Labs: Lactic Acid, Venous    Component Value Date/Time   LATICACIDVEN 1.3 01/09/2019 0620    MICROBIOLOGY: Recent Results (from the past 240 hour(s))  Culture, Urine     Status: None   Collection Time: 02/04/19  1:54 PM  Result Value Ref Range Status   Specimen Description URINE, CLEAN CATCH  Final   Special Requests NONE  Final   Culture   Final    NO GROWTH Performed at [facility], [address] [place], [place] [address]    Report Status 02/05/2019 FINAL  Final    RADIOLOGY STUDIES/RESULTS: Mr Lumbar Spine W Wo Contrast  Result Date: 02/04/2019 CLINICAL DATA:  Coccygeal wound infection. EXAM: MRI LUMBAR SPINE WITHOUT AND WITH CONTRAST TECHNIQUE: Multiplanar and multiecho pulse sequences of the lumbar spine were  obtained without and with intravenous contrast. CONTRAST:  7 mL Gadavist COMPARISON:  None. FINDINGS: Segmentation: Normal. The lowest disc space is considered to be L5-S1. Alignment:  Normal Vertebrae: There is no bone marrow edema. No compression fracture. No abnormal contrast enhancement. Normal T1-weighted signal is preserved throughout the visualized spine and sacrum. The coccyx is outside of the field of view. Conus medullaris and cauda equina: The conus medullaris terminates at the L1 level. The cauda equina and conus medullaris are both normal. Paraspinal and other soft tissues: The visualized retroperitoneal organs and paraspinal soft tissues are  normal. Disc levels: Sagittal plane imaging includes the T12-L1 disc level through the upper sacrum, with axial imaging of the L1-2 to L5-S1 disc levels. T12-L1: Normal. L1-2: Normal. L2-3: Disc desiccation with small bulge and superimposed central protrusion. No central spinal canal stenosis. No neural foraminal stenosis. Normal facets. L3-4: Disc desiccation and small central protrusion. No spinal canal or neural foraminal stenosis. L4-5: Minimal disc bulge no spinal canal or neural foraminal stenosis. L5-S1: Central/right subarticular disc protrusion with narrowing of the right lateral recess. No central spinal canal stenosis or neural foraminal stenosis. The visualized portion of the sacrum is normal. IMPRESSION: 1. No evidence of active osteomyelitis of the lumbar spine or sacrum. Please note that the coccyx is outside the field of view. 2. No fluid collection or other focal abnormality of the soft tissues dorsal to the spine. 3. Multilevel mild degenerative disc disease. Mild narrowing of the right L5-S1 lateral recess could cause radicular symptoms in a right S1 distribution. Electronically Signed   By: Deatra Robinson M.D.   On: 02/04/2019 23:06   Dg Chest Port 1 View  Result Date: 02/04/2019 CLINICAL DATA:  Dyspnea. EXAM: PORTABLE CHEST 1 VIEW COMPARISON:  One-view chest x-ray 02/03/2019 FINDINGS: The heart is mildly enlarged. Mild pulmonary vascular congestion is stable. Elevation of the right hemidiaphragm is again noted. There is some volume loss on the right with associated atelectasis. No significant airspace consolidation is present. A small right effusion is not excluded. IMPRESSION: 1. Mild pulmonary vascular congestion without frank edema. 2. Similar appearance of right basilar atelectasis. Electronically Signed   By: Marin Roberts M.D.   On: 02/04/2019 08:31   Dg Chest Port 1 View  Result Date: 02/03/2019 CLINICAL DATA:  Shortness of breath EXAM: PORTABLE CHEST 1 VIEW COMPARISON:   01/19/2019 FINDINGS: Cardiac shadows within normal limits. Elevation of the right hemidiaphragm is noted with right basilar atelectasis. No other focal infiltrate is seen. Gastrostomy tube is noted within the stomach. IMPRESSION: Mild right basilar atelectasis. Electronically Signed   By: Alcide Clever M.D.   On: 02/03/2019 08:24   Dg Chest Port 1 View  Result Date: 01/19/2019 CLINICAL DATA:  Acute respiratory failure EXAM: PORTABLE CHEST 1 VIEW COMPARISON:  01/15/2019 FINDINGS: Cardiac shadow is stable. Left lung remains clear. Endotracheal tube is noted in satisfactory position. Right basilar atelectasis is noted increased from the prior exam. No pneumothorax is seen. No acute bony abnormality is noted. IMPRESSION: Increasing right basilar atelectasis. Electronically Signed   By: Alcide Clever M.D.   On: 01/19/2019 07:23   Dg Swallowing Func-speech Pathology  Result Date: 02/04/2019 Objective Swallowing Evaluation: Type of Study: MBS-Modified Barium Swallow Study  Patient Details Name: Antonio [name]. MRN: [id number] Date of Birth: 1956-02-08 Today's Date: 02/04/2019 Time: SLP Start Time (ACUTE ONLY): 0920 -SLP Stop Time (ACUTE ONLY): 0931 SLP Time Calculation (min) (ACUTE ONLY): 11 min Past Medical History: Past Medical History:  Diagnosis Date  Coronary artery disease   Diabetes mellitus without complication (HCC)   Hyperlipidemia   Hypertension   Osteomyelitis (HCC) 03/25/2017  RT FOOT Past Surgical History: Past Surgical History: Procedure Laterality Date  AMPUTATION Right 03/27/2017  Procedure: 1st Ray Amputation Right Foot;  Surgeon: Nadara Mustard, MD;  Location: [facility] OR;  Service: Orthopedics;  Laterality: Right;  APPENDECTOMY    BELOW KNEE LEG AMPUTATION Left   CARDIAC CATHETERIZATION    CORONARY STENT INTERVENTION  2005  Great toe amputation right.    I&D EXTREMITY Left 12/30/2015  Procedure: IRRIGATION AND DEBRIDEMENT LEFT FOOT, TRANSMETATARSAL AMPUTATION WITH APPLICATION OF ANTIBIOTIC  BEADS AND WOUND VAC;  Surgeon: Nadara Mustard, MD;  Location: [facility] OR;  Service: Orthopedics;  Laterality: Left;  IR GASTROSTOMY TUBE MOD SED  12/23/2018  TONSILLECTOMY    TRACHEOSTOMY  11/2018 HPI: 63 year old male coming in with fever cough chest pain and shortness of breath was found to have influenza A. PMH: diabetes mellitus, hypertension.  Intubated multiple times 1/3 to 1/16 with trach placed 1/16. Repeat MBS on 3/28 for possible initiation of po's. This is pt's 3rd MBS. Plan to repeat to determine upgrade initially; on 3/10 concern pt may have aspirated and made NPO.   Subjective: pt is alert and cooperative but needs encouragement Assessment / Plan / Recommendation CHL IP CLINICAL IMPRESSIONS 02/04/2019 Clinical Impression Pt has been decannulated since 3/9 with evidence of incomplete stoma closure indicated by breathy vocal quality. He continues to exhibit moderate-severe pharyngeal dysphagia noteably due to incompetent laryngeal closure and poor retroflexion of epiglottis. Honey thick penetrated vestibule with initial swallow of MBS and expelled with cues for cough and was not penetrated with subsequent trial. Nectar thick entered vestibule with penetration aspiration score (PAS) of 3, 5 & 7 (did not exit trachea with cough). Chin tuck actually facilitated entrance of barium into trachea and swallow with head in neutral applying pressure to gauze/stoma to increase pressure was ineffective. Mild-mod amount of vallecular and pyriform sinus residue not significantly improved. Recommend pt resume po's of honey thick liquids, texture of Dys 2, multiple swallows and hard coughs/throat clear after every other bite/sip. Pt should continue to receive ST at skilled nursing facility for continued intervention and repeat MBS when appropriate (not before 6-8 weeks). Will continue to follow while here.  SLP Visit Diagnosis Dysphagia, pharyngeal phase (R13.13) Attention and concentration deficit following -- Frontal lobe  and executive function deficit following -- Impact on safety and function Severe aspiration risk   CHL IP TREATMENT RECOMMENDATION 02/04/2019 Treatment Recommendations Therapy as outlined in treatment plan below   Prognosis 02/04/2019 Prognosis for Safe Diet Advancement Good Barriers to Reach Goals -- Barriers/Prognosis Comment -- CHL IP DIET RECOMMENDATION 02/04/2019 SLP Diet Recommendations Dysphagia 2 (Fine chop) solids;Honey thick liquids Liquid Administration via Cup;No straw Medication Administration Whole meds with puree Compensations Slow rate;Small sips/bites;Multiple dry swallows after each bite/sip;Hard cough after swallow;Clear throat intermittently Postural Changes Seated upright at 90 degrees;Remain semi-upright after after feeds/meals (Comment)   CHL IP OTHER RECOMMENDATIONS 02/04/2019 Recommended Consults -- Oral Care Recommendations Oral care BID Other Recommendations --   CHL IP FOLLOW UP RECOMMENDATIONS 02/04/2019 Follow up Recommendations Skilled Nursing facility   [facility] IP FREQUENCY AND DURATION 02/04/2019 Speech Therapy Frequency (ACUTE ONLY) min 2x/week Treatment Duration 2 weeks      CHL IP ORAL PHASE 02/04/2019 Oral Phase WFL Oral - Pudding Teaspoon -- Oral - Pudding Cup -- Oral - Honey Teaspoon -- Oral - Honey Cup [facility]  Oral - Nectar Teaspoon -- Oral - Nectar Cup WFL Oral - Nectar Straw -- Oral - Thin Teaspoon -- Oral - Thin Cup NT Oral - Thin Straw -- Oral - Puree NT Oral - Mech Soft -- Oral - Regular NT Oral - Multi-Consistency -- Oral - Pill -- Oral Phase - Comment --  CHL IP PHARYNGEAL PHASE 02/04/2019 Pharyngeal Phase Impaired Pharyngeal- Pudding Teaspoon -- Pharyngeal -- Pharyngeal- Pudding Cup -- Pharyngeal -- Pharyngeal- Honey Teaspoon NT Pharyngeal -- Pharyngeal- Honey Cup Penetration/Aspiration during swallow;Pharyngeal residue - valleculae;Pharyngeal residue - pyriform;Reduced airway/laryngeal closure Pharyngeal Material enters airway, remains ABOVE vocal cords and not ejected out  Pharyngeal- Nectar Teaspoon NT Pharyngeal -- Pharyngeal- Nectar Cup Penetration/Aspiration during swallow;Reduced airway/laryngeal closure;Pharyngeal residue - valleculae;Pharyngeal residue - pyriform Pharyngeal Material enters airway, passes BELOW cords and not ejected out despite cough attempt by patient;Material enters airway, CONTACTS cords and not ejected out Pharyngeal- Nectar Straw -- Pharyngeal -- Pharyngeal- Thin Teaspoon NT Pharyngeal -- Pharyngeal- Thin Cup NT Pharyngeal -- Pharyngeal- Thin Straw -- Pharyngeal -- Pharyngeal- Puree NT Pharyngeal -- Pharyngeal- Mechanical Soft -- Pharyngeal -- Pharyngeal- Regular NT Pharyngeal -- Pharyngeal- Multi-consistency -- Pharyngeal -- Pharyngeal- Pill -- Pharyngeal -- Pharyngeal Comment --  CHL IP CERVICAL ESOPHAGEAL PHASE 02/04/2019 Cervical Esophageal Phase WFL Pudding Teaspoon -- Pudding Cup -- Honey Teaspoon -- Honey Cup -- Nectar Teaspoon -- Nectar Cup -- Nectar Straw -- Thin Teaspoon -- Thin Cup -- Thin Straw -- Puree -- Mechanical Soft -- Regular -- Multi-consistency -- Pill -- Cervical Esophageal Comment -- Royce Macadamia 02/04/2019, 10:23 AM Breck Coons [employer] administrator Pager [phone] Office [phone]              Dg Swallowing Func-speech Pathology  Result Date: 01/30/2019 Objective Swallowing Evaluation: Type of Study: MBS-Modified Barium Swallow Study  Patient Details Name: Antonio [name]. MRN: [id number] Date of Birth: May 14, 1956 Today's Date: 01/30/2019 Time: SLP Start Time (ACUTE ONLY): 1328 -SLP Stop Time (ACUTE ONLY): 1348 SLP Time Calculation (min) (ACUTE ONLY): 20 min Past Medical History: Past Medical History: Diagnosis Date  Coronary artery disease   Diabetes mellitus without complication (HCC)   Hyperlipidemia   Hypertension   Osteomyelitis (HCC) 03/25/2017  RT FOOT Past Surgical History: Past Surgical History: Procedure Laterality Date  AMPUTATION Right 03/27/2017  Procedure: 1st Ray Amputation  Right Foot;  Surgeon: Nadara Mustard, MD;  Location[facility] OR;  Service: Orthopedics;  Laterality: Right;  APPENDECTOMY    BELOW KNEE LEG AMPUTATION Left   CARDIAC CATHETERIZATION    CORONARY STENT INTERVENTION  2005  Great toe amputation right.    I&D EXTREMITY Left 12/30/2015  Procedure: IRRIGATION AND DEBRIDEMENT LEFT FOOT, TRANSMETATARSAL AMPUTATION WITH APPLICATION OF ANTIBIOTIC BEADS AND WOUND VAC;  Surgeon: Nadara Mustard, MD;  Location: [facility] OR;  Service: Orthopedics;  Laterality: Left;  IR GASTROSTOMY TUBE MOD SED  12/23/2018  TONSILLECTOMY    TRACHEOSTOMY  11/2018 HPI: 63 year old male coming in with fever cough chest pain and shortness of breath was found to have influenza A. PMH: diabetes mellitus, hypertension.  Intubated multiple times 1/3 to 1/16 with trach placed 1/16. Repeat MBS on 3/28 for possible initiation of po's.  Subjective: pt is alert and cooperative but needs encouragement Assessment / Plan / Recommendation CHL IP CLINICAL IMPRESSIONS 01/30/2019 Clinical Impression Pt's trach was capped this morning and during MBS with normal oropharyngeal pressures. Swallow function has improved from prior study despite silent aspiration with thin and penetration of nectar and honey. Laryngeal  excursion, pharyngeal contraction and epiglottic inversion are not of normal strength and ROM however improved from last week. Incomplete closure led to penetration with thin, nectar and only flash with honey thick x 1. Chin tuck did not prevent penetration (silent) with nectar and vallecular and pyriform sinus residue present and was mildly increased with honey thick. Verbal cues to perform second swallow reduced volume. Puree was not penetrated this study. Recommend Dys 2 given mild oral residue, impulsivity and deconditioning. Honey thick liquids recommended with two swallows, intermittent throat clears and pills whole in applesauce. ST will continue to follow for safety and upgrade of po textures. SLP Visit  Diagnosis Dysphagia, oropharyngeal phase (R13.12) Attention and concentration deficit following -- Frontal lobe and executive function deficit following -- Impact on safety and function Moderate aspiration risk;Severe aspiration risk   CHL IP TREATMENT RECOMMENDATION 01/30/2019 Treatment Recommendations Therapy as outlined in treatment plan below   Prognosis 01/30/2019 Prognosis for Safe Diet Advancement Good Barriers to Reach Goals -- Barriers/Prognosis Comment -- CHL IP DIET RECOMMENDATION 01/30/2019 SLP Diet Recommendations Honey thick liquids;Dysphagia 2 (Fine chop) solids Liquid Administration via Cup;No straw Medication Administration Whole meds with puree Compensations Slow rate;Small sips/bites;Multiple dry swallows after each bite/sip;Clear throat intermittently Postural Changes Seated upright at 90 degrees   CHL IP OTHER RECOMMENDATIONS 01/30/2019 Recommended Consults -- Oral Care Recommendations Oral care BID Other Recommendations --   CHL IP FOLLOW UP RECOMMENDATIONS 01/30/2019 Follow up Recommendations (No Data)   CHL IP FREQUENCY AND DURATION 01/30/2019 Speech Therapy Frequency (ACUTE ONLY) min 2x/week Treatment Duration 2 weeks      CHL IP ORAL PHASE 01/30/2019 Oral Phase Impaired Oral - Pudding Teaspoon -- Oral - Pudding Cup -- Oral - Honey Teaspoon -- Oral - Honey Cup Lingual/palatal residue Oral - Nectar Teaspoon -- Oral - Nectar Cup Lingual/palatal residue Oral - Nectar Straw -- Oral - Thin Teaspoon -- Oral - Thin Cup -- Oral - Thin Straw -- Oral - Puree Lingual/palatal residue Oral - Mech Soft -- Oral - Regular WFL Oral - Multi-Consistency -- Oral - Pill -- Oral Phase - Comment --  CHL IP PHARYNGEAL PHASE 01/30/2019 Pharyngeal Phase Impaired Pharyngeal- Pudding Teaspoon -- Pharyngeal -- Pharyngeal- Pudding Cup -- Pharyngeal -- Pharyngeal- Honey Teaspoon NT Pharyngeal -- Pharyngeal- Honey Cup Pharyngeal residue - valleculae;Pharyngeal residue - pyriform;Reduced epiglottic inversion;Penetration/Aspiration during  swallow;Reduced airway/laryngeal closure Pharyngeal Material enters airway, remains ABOVE vocal cords then ejected out Pharyngeal- Nectar Teaspoon NT Pharyngeal -- Pharyngeal- Nectar Cup Penetration/Aspiration during swallow;Reduced airway/laryngeal closure;Reduced epiglottic inversion;Pharyngeal residue - valleculae;Pharyngeal residue - pyriform;Compensatory strategies attempted (with notebox) Pharyngeal Material enters airway, remains ABOVE vocal cords and not ejected out Pharyngeal- Nectar Straw -- Pharyngeal -- Pharyngeal- Thin Teaspoon NT Pharyngeal -- Pharyngeal- Thin Cup Penetration/Aspiration during swallow;Pharyngeal residue - valleculae;Pharyngeal residue - pyriform;Moderate aspiration;Trace aspiration;Reduced airway/laryngeal closure;Reduced epiglottic inversion Pharyngeal Material enters airway, passes BELOW cords without attempt by patient to eject out (silent aspiration) Pharyngeal- Thin Straw -- Pharyngeal -- Pharyngeal- Puree Pharyngeal residue - pyriform;Pharyngeal residue - valleculae;Reduced epiglottic inversion Pharyngeal Material does not enter airway Pharyngeal- Mechanical Soft -- Pharyngeal -- Pharyngeal- Regular WFL Pharyngeal -- Pharyngeal- Multi-consistency -- Pharyngeal -- Pharyngeal- Pill -- Pharyngeal -- Pharyngeal Comment --  CHL IP CERVICAL ESOPHAGEAL PHASE 01/30/2019 Cervical Esophageal Phase WFL Pudding Teaspoon -- Pudding Cup -- Honey Teaspoon -- Honey Cup -- Nectar Teaspoon -- Nectar Cup -- Nectar Straw -- Thin Teaspoon -- Thin Cup -- Thin Straw -- Puree -- Mechanical Soft -- Regular -- Multi-consistency -- Pill -- Cervical  Esophageal Comment -- Royce Macadamia 01/30/2019, 2:53 PM Breck Coons [employer] administrator Pager [phone] Office [phone]              Dg Swallowing Func-speech Pathology  Result Date: 01/23/2019 Objective Swallowing Evaluation: Type of Study: MBS-Modified Barium Swallow Study  Patient Details Name: Antonio [name]. MRN:  [id number] Date of Birth: 08/23/1956 Today's Date: 01/23/2019 Time: SLP Start Time (ACUTE ONLY): 1254 -SLP Stop Time (ACUTE ONLY): 1308 SLP Time Calculation (min) (ACUTE ONLY): 14 min Past Medical History: Past Medical History: Diagnosis Date  Coronary artery disease   Diabetes mellitus without complication (HCC)   Hyperlipidemia   Hypertension   Osteomyelitis (HCC) 03/25/2017  RT FOOT Past Surgical History: Past Surgical History: Procedure Laterality Date  AMPUTATION Right 03/27/2017  Procedure: 1st Ray Amputation Right Foot;  Surgeon: Nadara Mustard, MD;  Location: [facility] OR;  Service: Orthopedics;  Laterality: Right;  APPENDECTOMY    BELOW KNEE LEG AMPUTATION Left   CARDIAC CATHETERIZATION    CORONARY STENT INTERVENTION  2005  Great toe amputation right.    I&D EXTREMITY Left 12/30/2015  Procedure: IRRIGATION AND DEBRIDEMENT LEFT FOOT, TRANSMETATARSAL AMPUTATION WITH APPLICATION OF ANTIBIOTIC BEADS AND WOUND VAC;  Surgeon: Nadara Mustard, MD;  Location: [facility] OR;  Service: Orthopedics;  Laterality: Left;  IR GASTROSTOMY TUBE MOD SED  12/23/2018  TONSILLECTOMY    TRACHEOSTOMY  11/2018 HPI: 63 year old male coming in with fever cough chest pain and shortness of breath was found to have influenza A. PMH: diabetes mellitus, hypertension.  Intubated multiple times 1/3 to 1/16 with trach placed 1/16.   Subjective: pt is alert and cooperative but needs encouragement Assessment / Plan / Recommendation CHL IP CLINICAL IMPRESSIONS 01/23/2019 Clinical Impression Pt has a moderate pharyngeal dysphagia due to reduced strength and airway closure s/p prolonged (and repeated) intubations. His timing for swallow trigger is good, but he has reduced hyolaryngeal movement, pharyngeal squeeze, epiglottic inversion, and tonge base retraction. Thin and nectar thick liquids enter the laryngeal vestibule during the swallow because of his incomplete closure, with penetration intermittently reaching the true vocal folds without spontaneous  cough elicited. Multiple cued coughs were required to eject penetrates. Honey thick liquids and purees left more residue in the valleculae and pyriform sinuses, with intermittent penetration after the swallow from the residual material. Recommend he remain NPO except for initiating small amounts of purees and spoonfuls of thickened liquids during SLP visits only to facilitate restrengthening. Pt may also benefit from EMST and other exercises to progress strength of cough and pharyngeal musculature.   SLP Visit Diagnosis Dysphagia, pharyngeal phase (R13.13) Attention and concentration deficit following -- Frontal lobe and executive function deficit following -- Impact on safety and function Moderate aspiration risk   CHL IP TREATMENT RECOMMENDATION 01/23/2019 Treatment Recommendations Therapy as outlined in treatment plan below   Prognosis 01/23/2019 Prognosis for Safe Diet Advancement Good Barriers to Reach Goals -- Barriers/Prognosis Comment -- CHL IP DIET RECOMMENDATION 01/23/2019 SLP Diet Recommendations NPO Liquid Administration via -- Medication Administration Via alternative means Compensations -- Postural Changes --   CHL IP OTHER RECOMMENDATIONS 01/23/2019 Recommended Consults -- Oral Care Recommendations Oral care QID Other Recommendations Have oral suction available   CHL IP FOLLOW UP RECOMMENDATIONS 01/23/2019 Follow up Recommendations Inpatient Rehab   CHL IP FREQUENCY AND DURATION 01/23/2019 Speech Therapy Frequency (ACUTE ONLY) min 2x/week Treatment Duration 2 weeks      CHL IP ORAL PHASE 01/23/2019 Oral Phase WFL Oral - Pudding Teaspoon --  Oral - Pudding Cup -- Oral - Honey Teaspoon -- Oral - Honey Cup -- Oral - Nectar Teaspoon -- Oral - Nectar Cup -- Oral - Nectar Straw -- Oral - Thin Teaspoon -- Oral - Thin Cup -- Oral - Thin Straw -- Oral - Puree -- Oral - Mech Soft -- Oral - Regular -- Oral - Multi-Consistency -- Oral - Pill -- Oral Phase - Comment --  CHL IP PHARYNGEAL PHASE 01/23/2019 Pharyngeal Phase  Impaired Pharyngeal- Pudding Teaspoon -- Pharyngeal -- Pharyngeal- Pudding Cup -- Pharyngeal -- Pharyngeal- Honey Teaspoon Reduced pharyngeal peristalsis;Reduced epiglottic inversion;Reduced anterior laryngeal mobility;Reduced laryngeal elevation;Reduced airway/laryngeal closure;Reduced tongue base retraction;Pharyngeal residue - valleculae;Pharyngeal residue - pyriform Pharyngeal -- Pharyngeal- Honey Cup -- Pharyngeal -- Pharyngeal- Nectar Teaspoon Reduced pharyngeal peristalsis;Reduced epiglottic inversion;Reduced anterior laryngeal mobility;Reduced laryngeal elevation;Reduced airway/laryngeal closure;Reduced tongue base retraction;Pharyngeal residue - valleculae;Pharyngeal residue - pyriform;Penetration/Aspiration during swallow Pharyngeal Material enters airway, remains ABOVE vocal cords and not ejected out Pharyngeal- Nectar Cup Reduced pharyngeal peristalsis;Reduced epiglottic inversion;Reduced anterior laryngeal mobility;Reduced laryngeal elevation;Reduced airway/laryngeal closure;Reduced tongue base retraction;Pharyngeal residue - valleculae;Pharyngeal residue - pyriform;Penetration/Aspiration during swallow Pharyngeal Material enters airway, remains ABOVE vocal cords and not ejected out Pharyngeal- Nectar Straw -- Pharyngeal -- Pharyngeal- Thin Teaspoon Reduced pharyngeal peristalsis;Reduced epiglottic inversion;Reduced anterior laryngeal mobility;Reduced laryngeal elevation;Reduced airway/laryngeal closure;Reduced tongue base retraction;Penetration/Aspiration during swallow Pharyngeal Material enters airway, CONTACTS cords and not ejected out Pharyngeal- Thin Cup -- Pharyngeal -- Pharyngeal- Thin Straw -- Pharyngeal -- Pharyngeal- Puree Reduced pharyngeal peristalsis;Reduced epiglottic inversion;Reduced anterior laryngeal mobility;Reduced laryngeal elevation;Reduced airway/laryngeal closure;Reduced tongue base retraction;Pharyngeal residue - valleculae;Pharyngeal residue - pyriform;Penetration/Apiration  after swallow Pharyngeal Material enters airway, remains ABOVE vocal cords and not ejected out Pharyngeal- Mechanical Soft -- Pharyngeal -- Pharyngeal- Regular -- Pharyngeal -- Pharyngeal- Multi-consistency -- Pharyngeal -- Pharyngeal- Pill -- Pharyngeal -- Pharyngeal Comment --  CHL IP CERVICAL ESOPHAGEAL PHASE 01/23/2019 Cervical Esophageal Phase WFL Pudding Teaspoon -- Pudding Cup -- Honey Teaspoon -- Honey Cup -- Nectar Teaspoon -- Nectar Cup -- Nectar Straw -- Thin Teaspoon -- Thin Cup -- Thin Straw -- Puree -- Mechanical Soft -- Regular -- Multi-consistency -- Pill -- Cervical Esophageal Comment -- Virl Axe Nix 01/23/2019, 2:32 PM  [name], M.A. CCC-SLP Acute Rehabilitation Services Pager [phone] Office [phone]               LOS: 78 days   Jeoffrey Massed, MD  [facility]  If 7PM-7AM, please contact night-coverage  Please page via [email or web address]  Go to [email or web address] and use [place]'s universal password to access. If you do not have the password, please contact the hospital operator.  Locate the [facility] provider you are looking for under [facility] and page to a number that you can be directly reached. If you still have difficulty reaching the provider, please page the [facility] (Director on Call) for the Hospitalists listed on amion for assistance.  02/14/2019, 1:53 PM

## 2019-02-15 LAB — CBC
HCT: 36.2 % — ABNORMAL LOW (ref 39.0–52.0)
Hemoglobin: 11.3 g/dL — ABNORMAL LOW (ref 13.0–17.0)
MCH: 26.9 pg (ref 26.0–34.0)
MCHC: 31.2 g/dL (ref 30.0–36.0)
MCV: 86.2 fL (ref 80.0–100.0)
Platelets: 264 10*3/uL (ref 150–400)
RBC: 4.2 MIL/uL — ABNORMAL LOW (ref 4.22–5.81)
RDW: 15.8 % — ABNORMAL HIGH (ref 11.5–15.5)
WBC: 10.1 10*3/uL (ref 4.0–10.5)
nRBC: 0 % (ref 0.0–0.2)

## 2019-02-15 LAB — BASIC METABOLIC PANEL
Anion gap: 9 (ref 5–15)
BUN: 25 mg/dL — AB (ref 8–23)
CO2: 30 mmol/L (ref 22–32)
Calcium: 9.1 mg/dL (ref 8.9–10.3)
Chloride: 97 mmol/L — ABNORMAL LOW (ref 98–111)
Creatinine, Ser: 0.59 mg/dL — ABNORMAL LOW (ref 0.61–1.24)
GFR calc Af Amer: >60 mL/min (ref >60)
GFR calc non Af Amer: >60 mL/min (ref >60)
GLUCOSE: 130 mg/dL — AB (ref 70–99)
Potassium: 4 mmol/L (ref 3.5–5.1)
Sodium: 136 mmol/L (ref 135–145)

## 2019-02-15 LAB — GLUCOSE, CAPILLARY
Glucose-Capillary: 116 mg/dL — ABNORMAL HIGH (ref 70–99)
Glucose-Capillary: 142 mg/dL — ABNORMAL HIGH (ref 70–99)
Glucose-Capillary: 184 mg/dL — ABNORMAL HIGH (ref 70–99)

## 2019-02-15 NOTE — Progress Notes (Signed)
PROGRESS NOTE        PATIENT DETAILS Name: Antonio [name]. Age: 63 y.o. Sex: male Date of Birth: 1956/05/14 Admit Date: 11/28/2018 Admitting Physician Reyes Ivan, MD NOB:SJGG, Orie Rout, NP  Brief Narrative: Patient is a 63 y.o. male admitted on 1/3 with influenza A causing acute hypoxic respiratory failure, cardiogenic shock-required prolonged mechanical ventilation-is currently s/p tracheostomy and PEG tube placement.  Significant events: 1/03  Admit, Influenza A positive 1/06  Self extubated  1/07  Re-intubated  1/08  Self-extubated  1/16 tracheostomy 1/21  Trach revision to XLT distal 1/28 PEG tube 2/03  Weaned 15 minutes  2/11  ATC  2/14  Trach changed to #4, mucus plugging >back to #5 XLT 2/15  Failed ATC with increased WOB 2/16  Failed ATC with increased WOB  2/21 back on pressure support 2/24 back on TC  01/27/19 - 5 XLT cuffless trach  01/29/2019: Size 4 cuff less tracheostomy placed 01/30/2019: Capping trials initiated 02/02/2019: Tracheostomy decannulated  Subjective: No major issues overnight-denies any chest pain or shortness of breath.  Lying comfortably in bed.  Assessment/Plan: Fever: Afebrile over the past few days-chest x-ray without any infiltrate, blood culture and urine culture on 3/11 negative.  Currently off all antimicrobial therapy-continue to monitor.  Stage IV sacral/coccygeal ulceration: Continue with hydrotherapy while inpatient-once discharge-General surgery recommending Dakins twice daily for 3 days. Patient will need twice daily wet to dry dressing changes with normal saline dampened Kerlix packed in the wound (make sure to get areas of tunneling) with dry 4x4 over this and abd pad covering wound.   Acute hypoxic respiratory failure secondary to influenza A, pseudomonal/Corynebacterium pneumonia: Prolonged hospitalization requiring prolonged ventilator support-subsequently tracheostomy on 1/16.  Currently  decannulated on 3/9-Per last PCCM note-if the wound does not close in 2 weeks-may need evaluation for tracheocutaneous fistula.  Acute systolic heart failure (EF 20-25% on 1/4, EF 60-65% by TTE on 1/29): Compensated-continue beta-blocker.  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  Cardiology recommending further work-up to be done in the outpatient setting.  Dysphagia: Speech therapy following-recommendations were for a dysphagia 2 diet-however patient noncompliant-and as a result SLP has upgraded to a regular diet.  No signs of aspiration at this point.  Metabolic encephalopathy: Resolved.  Etiology felt to be secondary to hypoxia/pneumonia.  DM-2: CBG stable continue Levemir 25 units twice daily and SSI.  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  Chronic pain syndrome: Continue Nucynta and as needed oxycodone  Debility/deconditioning: Secondary to acute illness-awaiting SNF bed  DVT Prophylaxis: Prophylactic Lovenox   Code Status: Full code  Family Communication: None at bedside  Disposition Plan: Remain inpatient-SNF on discharge  Antimicrobial agents: Anti-infectives (From admission, onward)   Start     Dose/Rate Route Frequency Ordered Stop   02/04/19 1300  vancomycin (VANCOCIN) 1,250 mg in sodium chloride 0.9 % 250 mL IVPB  Status:  Discontinued     1,250 mg 166.7 mL/hr over 90 Minutes Intravenous Every 12 hours 02/04/19 1200 02/06/19 1002   02/04/19 1200  metroNIDAZOLE (FLAGYL) IVPB 500 mg  Status:  Discontinued     500 mg 100 mL/hr over 60 Minutes Intravenous Every 8 hours 02/04/19 1146 02/06/19 1002   02/04/19 0930  ceFEPIme (MAXIPIME) 2 g in sodium chloride 0.9 % 100 mL IVPB  2 g 200 mL/hr over 30 Minutes Intravenous Every 8 hours 02/04/19 0926 02/08/19 2359   02/03/19 1200  meropenem (MERREM) 1 g in sodium chloride 0.9 % 100 mL IVPB  Status:  Discontinued     1 g 200 mL/hr over 30 Minutes Intravenous Every 8  hours 02/03/19 1054 02/04/19 0924   01/19/19 1400  cefTAZidime (FORTAZ) 2 g in sodium chloride 0.9 % 100 mL IVPB     2 g 200 mL/hr over 30 Minutes Intravenous Every 8 hours 01/19/19 1120 01/24/19 2359   01/15/19 1500  cefTAZidime (FORTAZ) 2 g in sodium chloride 0.9 % 100 mL IVPB     2 g 200 mL/hr over 30 Minutes Intravenous Every 8 hours 01/15/19 1448 01/19/19 0542   01/14/19 1400  colistimethate ((COLYMYCIN-M)) nebulized solution 150 mg  Status:  Discontinued     150 mg Inhalation 3 times daily 01/14/19 1142 01/19/19 1119   01/14/19 1300  meropenem (MERREM) 1 g in sodium chloride 0.9 % 100 mL IVPB  Status:  Discontinued     1 g 200 mL/hr over 30 Minutes Intravenous Every 8 hours 01/14/19 1235 01/15/19 1448   12/23/18 1506  vancomycin (VANCOCIN) 1-5 GM/200ML-% IVPB    Note to Pharmacy:  [facility]   : cabinet override      12/23/18 1506 12/23/18 1515   12/20/18 2200  vancomycin (VANCOCIN) IVPB 1000 mg/200 mL premix  Status:  Discontinued     1,000 mg 200 mL/hr over 60 Minutes Intravenous Every 12 hours 12/20/18 1804 12/22/18 1413   12/18/18 0800  ceFEPIme (MAXIPIME) 1 g in sodium chloride 0.9 % 100 mL IVPB  Status:  Discontinued     1 g 200 mL/hr over 30 Minutes Intravenous Every 8 hours 12/18/18 0713 12/19/18 1201   12/17/18 0530  vancomycin (VANCOCIN) 1,500 mg in sodium chloride 0.9 % 500 mL IVPB  Status:  Discontinued     1,500 mg 250 mL/hr over 120 Minutes Intravenous Every 12 hours 12/16/18 1704 12/20/18 1804   12/16/18 1730  cefTAZidime (FORTAZ) 2 g in sodium chloride 0.9 % 100 mL IVPB  Status:  Discontinued     2 g 200 mL/hr over 30 Minutes Intravenous Every 8 hours 12/16/18 1704 12/18/18 0702   12/16/18 1730  vancomycin (VANCOCIN) 2,000 mg in sodium chloride 0.9 % 500 mL IVPB     2,000 mg 250 mL/hr over 120 Minutes Intravenous  Once 12/16/18 1704 12/16/18 2030   12/07/18 1300  erythromycin 500 mg in sodium chloride 0.9 % 100 mL IVPB  Status:  Discontinued     500 mg 100  mL/hr over 60 Minutes Intravenous Every 6 hours 12/07/18 1118 12/16/18 1627   12/06/18 1500  vancomycin (VANCOCIN) 1,250 mg in sodium chloride 0.9 % 250 mL IVPB  Status:  Discontinued     1,250 mg 166.7 mL/hr over 90 Minutes Intravenous Every 12 hours 12/06/18 1436 12/07/18 1118   12/06/18 0100  vancomycin (VANCOCIN) 1,250 mg in sodium chloride 0.9 % 250 mL IVPB     1,250 mg 166.7 mL/hr over 90 Minutes Intravenous  Once 12/05/18 1054 12/06/18 0146   12/05/18 1200  vancomycin (VANCOCIN) 2,000 mg in sodium chloride 0.9 % 500 mL IVPB     2,000 mg 250 mL/hr over 120 Minutes Intravenous  Once 12/05/18 1054 12/05/18 1350   12/05/18 1200  cefTAZidime (FORTAZ) 1 g in sodium chloride 0.9 % 100 mL IVPB  Status:  Discontinued     1 g 200 mL/hr over  30 Minutes Intravenous Every 8 hours 12/05/18 1106 12/07/18 1118   11/30/18 2200  oseltamivir (TAMIFLU) 6 MG/ML suspension 30 mg     30 mg Per Tube 2 times daily 11/30/18 1348 12/03/18 2159   11/30/18 2200  ceFEPIme (MAXIPIME) 1 g in sodium chloride 0.9 % 100 mL IVPB  Status:  Discontinued     1 g 200 mL/hr over 30 Minutes Intravenous Every 12 hours 11/30/18 1350 12/01/18 1039   11/29/18 2200  ceFEPIme (MAXIPIME) 2 g in sodium chloride 0.9 % 100 mL IVPB  Status:  Discontinued     2 g 200 mL/hr over 30 Minutes Intravenous Every 12 hours 11/29/18 1645 11/30/18 1350   11/29/18 1100  oseltamivir (TAMIFLU) 6 MG/ML suspension 75 mg  Status:  Discontinued     75 mg Per Tube 2 times daily 11/29/18 1000 11/30/18 1348   11/28/18 2300  oseltamivir (TAMIFLU) capsule 75 mg  Status:  Discontinued     75 mg Oral 2 times daily 11/28/18 2238 11/29/18 1000   11/28/18 2030  oseltamivir (TAMIFLU) capsule 75 mg     75 mg Oral  Once 11/28/18 2029 11/28/18 2207   11/28/18 1745  vancomycin (VANCOCIN) 2,000 mg in sodium chloride 0.9 % 500 mL IVPB     2,000 mg 250 mL/hr over 120 Minutes Intravenous  Once 11/28/18 1741 11/28/18 2203   11/28/18 1745  piperacillin-tazobactam  (ZOSYN) IVPB 3.375 g     3.375 g 100 mL/hr over 30 Minutes Intravenous  Once 11/28/18 1741 11/28/18 1834      Procedures: See above  CONSULTS:  cardiology, pulmonary/intensive care and general surgery  Time spent: 15 minutes-Greater than 50% of this time was spent in counseling, explanation of diagnosis, planning of further management, and coordination of care.  MEDICATIONS: Scheduled Meds:  arformoterol  15 mcg Nebulization BID   aspirin  81 mg Per Tube Daily   atorvastatin  40 mg Per Tube q1800   budesonide (PULMICORT) nebulizer solution  0.5 mg Nebulization BID   carvedilol  3.125 mg Per Tube BID WC   collagenase   Topical Daily   enoxaparin (LOVENOX) injection  40 mg Subcutaneous Q24H   famotidine  20 mg Per Tube BID   feeding supplement (PRO-STAT SUGAR FREE 64)  30 mL Oral BID   folic acid  1 mg Per Tube Daily   guaiFENesin  5 mL Per Tube Q12H   insulin aspart  0-20 Units Subcutaneous TID WC   insulin detemir  25 Units Subcutaneous BID   multivitamin with minerals  1 tablet Oral Daily   polyethylene glycol  17 g Per Tube Daily   tapentadol  50 mg Oral QID   thiamine  100 mg Per Tube Daily   Continuous Infusions:  sodium chloride 10 mL/hr at 01/24/19 2000   PRN Meds:.sodium chloride, acetaminophen (TYLENOL) oral liquid 160 mg/5 mL, albuterol, alum & mag hydroxide-simeth **AND** lidocaine, bisacodyl, clonazePAM, docusate, ipratropium-albuterol, oxyCODONE, Resource ThickenUp Clear, senna   PHYSICAL EXAM: Vital signs: Vitals:   02/14/19 2048 02/15/19 0501 02/15/19 1008 02/15/19 1009  BP: 132/67 132/66    Pulse: 80 94    Resp: 16 16    Temp: 99.2 F (37.3 C) 98.9 F (37.2 C)    TempSrc: Oral Oral    SpO2: 97% 98% 98% 98%  Weight:      Height:       Filed Weights   02/11/19 0547 02/12/19 0542 02/13/19 0436  Weight: 81.2 kg 87 kg 63 kg  Body mass index is 18.84 kg/m.   General appearance :Awake, alert, not in any distress.  HEENT:  Atraumatic and Normocephalic Resp:Good air entry bilaterally, no added sounds  CVS: S1 S2 regular, no murmurs.  GI: Bowel sounds present, Non tender and not distended with no gaurding, rigidity or rebound.No organomegaly Extremities: Left AKA Neurology: Nonfocal Musculoskeletal:No digital cyanosis Skin:No Rash, warm and dry  I have personally reviewed following labs and imaging studies  LABORATORY DATA: CBC: Recent Labs  Lab 02/09/19 0502 02/11/19 0320 02/15/19 0455  WBC 4.8 7.1 10.1  NEUTROABS 3.2 3.9  --   HGB 9.0* 10.9* 11.3*  HCT 31.6* 36.0* 36.2*  MCV 91.3 85.7 86.2  PLT 224 271 264    Basic Metabolic Panel: Recent Labs  Lab 02/09/19 0502 02/11/19 0320 02/15/19 0455  NA 141 136 136  K 3.1* 4.5 4.0  CL 100 97* 97*  CO2 31 34* 30  GLUCOSE 84 180* 130*  BUN 14 16 25*  CREATININE 1.11 0.53* 0.59*  CALCIUM 9.6 8.8* 9.1  MG 2.4 1.7  --     GFR: Estimated Creatinine Clearance: 86.4 mL/min (A) (by C-G formula based on SCr of 0.59 mg/dL (L)).  Liver Function Tests: Recent Labs  Lab 02/09/19 0502  AST 19  ALT 8  ALKPHOS 108  BILITOT 1.7*  PROT 5.6*  ALBUMIN 3.0*   No results for input(s): LIPASE, AMYLASE in the last 168 hours. No results for input(s): AMMONIA in the last 168 hours.  Coagulation Profile: No results for input(s): INR, PROTIME in the last 168 hours.  Cardiac Enzymes: No results for input(s): CKTOTAL, CKMB, CKMBINDEX, TROPONINI in the last 168 hours.  BNP (last 3 results) No results for input(s): PROBNP in the last 8760 hours.  HbA1C: No results for input(s): HGBA1C in the last 72 hours.  CBG: Recent Labs  Lab 02/14/19 0758 02/14/19 1152 02/14/19 1654 02/14/19 2046 02/15/19 0747  GLUCAP 116* 180* 161* 140* 116*    Lipid Profile: No results for input(s): CHOL, HDL, LDLCALC, TRIG, CHOLHDL, LDLDIRECT in the last 72 hours.  Thyroid Function Tests: No results for input(s): TSH, T4TOTAL, FREET4, T3FREE, THYROIDAB in the last 72  hours.  Anemia Panel: No results for input(s): VITAMINB12, FOLATE, FERRITIN, TIBC, IRON, RETICCTPCT in the last 72 hours.  Urine analysis:    Component Value Date/Time   COLORURINE YELLOW 02/04/2019 1400   APPEARANCEUR HAZY (A) 02/04/2019 1400   LABSPEC 1.028 02/04/2019 1400   PHURINE 5.0 02/04/2019 1400   GLUCOSEU 150 (A) 02/04/2019 1400   HGBUR NEGATIVE 02/04/2019 1400   BILIRUBINUR NEGATIVE 02/04/2019 1400   KETONESUR NEGATIVE 02/04/2019 1400   PROTEINUR 100 (A) 02/04/2019 1400   NITRITE NEGATIVE 02/04/2019 1400   LEUKOCYTESUR NEGATIVE 02/04/2019 1400    Sepsis Labs: Lactic Acid, Venous    Component Value Date/Time   LATICACIDVEN 1.3 01/09/2019 0620    MICROBIOLOGY: No results found for this or any previous visit (from the past 240 hour(s)).  RADIOLOGY STUDIES/RESULTS: Mr Lumbar Spine W Wo Contrast  Result Date: 02/04/2019 CLINICAL DATA:  Coccygeal wound infection. EXAM: MRI LUMBAR SPINE WITHOUT AND WITH CONTRAST TECHNIQUE: Multiplanar and multiecho pulse sequences of the lumbar spine were obtained without and with intravenous contrast. CONTRAST:  7 mL Gadavist COMPARISON:  None. FINDINGS: Segmentation: Normal. The lowest disc space is considered to be L5-S1. Alignment:  Normal Vertebrae: There is no bone marrow edema. No compression fracture. No abnormal contrast enhancement. Normal T1-weighted signal is preserved throughout the visualized spine and  sacrum. The coccyx is outside of the field of view. Conus medullaris and cauda equina: The conus medullaris terminates at the L1 level. The cauda equina and conus medullaris are both normal. Paraspinal and other soft tissues: The visualized retroperitoneal organs and paraspinal soft tissues are normal. Disc levels: Sagittal plane imaging includes the T12-L1 disc level through the upper sacrum, with axial imaging of the L1-2 to L5-S1 disc levels. T12-L1: Normal. L1-2: Normal. L2-3: Disc desiccation with small bulge and superimposed  central protrusion. No central spinal canal stenosis. No neural foraminal stenosis. Normal facets. L3-4: Disc desiccation and small central protrusion. No spinal canal or neural foraminal stenosis. L4-5: Minimal disc bulge no spinal canal or neural foraminal stenosis. L5-S1: Central/right subarticular disc protrusion with narrowing of the right lateral recess. No central spinal canal stenosis or neural foraminal stenosis. The visualized portion of the sacrum is normal. IMPRESSION: 1. No evidence of active osteomyelitis of the lumbar spine or sacrum. Please note that the coccyx is outside the field of view. 2. No fluid collection or other focal abnormality of the soft tissues dorsal to the spine. 3. Multilevel mild degenerative disc disease. Mild narrowing of the right L5-S1 lateral recess could cause radicular symptoms in a right S1 distribution. Electronically Signed   By: Deatra Robinson M.D.   On: 02/04/2019 23:06   Dg Chest Port 1 View  Result Date: 02/04/2019 CLINICAL DATA:  Dyspnea. EXAM: PORTABLE CHEST 1 VIEW COMPARISON:  One-view chest x-ray 02/03/2019 FINDINGS: The heart is mildly enlarged. Mild pulmonary vascular congestion is stable. Elevation of the right hemidiaphragm is again noted. There is some volume loss on the right with associated atelectasis. No significant airspace consolidation is present. A small right effusion is not excluded. IMPRESSION: 1. Mild pulmonary vascular congestion without frank edema. 2. Similar appearance of right basilar atelectasis. Electronically Signed   By: Marin Roberts M.D.   On: 02/04/2019 08:31   Dg Chest Port 1 View  Result Date: 02/03/2019 CLINICAL DATA:  Shortness of breath EXAM: PORTABLE CHEST 1 VIEW COMPARISON:  01/19/2019 FINDINGS: Cardiac shadows within normal limits. Elevation of the right hemidiaphragm is noted with right basilar atelectasis. No other focal infiltrate is seen. Gastrostomy tube is noted within the stomach. IMPRESSION: Mild right  basilar atelectasis. Electronically Signed   By: Alcide Clever M.D.   On: 02/03/2019 08:24   Dg Chest Port 1 View  Result Date: 01/19/2019 CLINICAL DATA:  Acute respiratory failure EXAM: PORTABLE CHEST 1 VIEW COMPARISON:  01/15/2019 FINDINGS: Cardiac shadow is stable. Left lung remains clear. Endotracheal tube is noted in satisfactory position. Right basilar atelectasis is noted increased from the prior exam. No pneumothorax is seen. No acute bony abnormality is noted. IMPRESSION: Increasing right basilar atelectasis. Electronically Signed   By: Alcide Clever M.D.   On: 01/19/2019 07:23   Dg Swallowing Func-speech Pathology  Result Date: 02/04/2019 Objective Swallowing Evaluation: Type of Study: MBS-Modified Barium Swallow Study  Patient Details Name: Antonio Jewart. MRN: [id number] Date of Birth: 1956/07/09 Today's Date: 02/04/2019 Time: SLP Start Time (ACUTE ONLY): 0920 -SLP Stop Time (ACUTE ONLY): 0931 SLP Time Calculation (min) (ACUTE ONLY): 11 min Past Medical History: Past Medical History: Diagnosis Date  Coronary artery disease   Diabetes mellitus without complication (HCC)   Hyperlipidemia   Hypertension   Osteomyelitis (HCC) 03/25/2017  RT FOOT Past Surgical History: Past Surgical History: Procedure Laterality Date  AMPUTATION Right 03/27/2017  Procedure: 1st Ray Amputation Right Foot;  Surgeon: Nadara Mustard, MD;  Location: [facility] OR;  Service: Orthopedics;  Laterality: Right;  APPENDECTOMY    BELOW KNEE LEG AMPUTATION Left   CARDIAC CATHETERIZATION    CORONARY STENT INTERVENTION  2005  Great toe amputation right.    I&D EXTREMITY Left 12/30/2015  Procedure: IRRIGATION AND DEBRIDEMENT LEFT FOOT, TRANSMETATARSAL AMPUTATION WITH APPLICATION OF ANTIBIOTIC BEADS AND WOUND VAC;  Surgeon: Nadara Mustard, MD;  Location: [facility] OR;  Service: Orthopedics;  Laterality: Left;  IR GASTROSTOMY TUBE MOD SED  12/23/2018  TONSILLECTOMY    TRACHEOSTOMY  11/2018 HPI: 63 year old male coming in with fever cough  chest pain and shortness of breath was found to have influenza A. PMH: diabetes mellitus, hypertension.  Intubated multiple times 1/3 to 1/16 with trach placed 1/16. Repeat MBS on 3/28 for possible initiation of po's. This is pt's 3rd MBS. Plan to repeat to determine upgrade initially; on 3/10 concern pt may have aspirated and made NPO.   Subjective: pt is alert and cooperative but needs encouragement Assessment / Plan / Recommendation CHL IP CLINICAL IMPRESSIONS 02/04/2019 Clinical Impression Pt has been decannulated since 3/9 with evidence of incomplete stoma closure indicated by breathy vocal quality. He continues to exhibit moderate-severe pharyngeal dysphagia noteably due to incompetent laryngeal closure and poor retroflexion of epiglottis. Honey thick penetrated vestibule with initial swallow of MBS and expelled with cues for cough and was not penetrated with subsequent trial. Nectar thick entered vestibule with penetration aspiration score (PAS) of 3, 5 & 7 (did not exit trachea with cough). Chin tuck actually facilitated entrance of barium into trachea and swallow with head in neutral applying pressure to gauze/stoma to increase pressure was ineffective. Mild-mod amount of vallecular and pyriform sinus residue not significantly improved. Recommend pt resume po's of honey thick liquids, texture of Dys 2, multiple swallows and hard coughs/throat clear after every other bite/sip. Pt should continue to receive ST at skilled nursing facility for continued intervention and repeat MBS when appropriate (not before 6-8 weeks). Will continue to follow while here.  SLP Visit Diagnosis Dysphagia, pharyngeal phase (R13.13) Attention and concentration deficit following -- Frontal lobe and executive function deficit following -- Impact on safety and function Severe aspiration risk   CHL IP TREATMENT RECOMMENDATION 02/04/2019 Treatment Recommendations Therapy as outlined in treatment plan below   Prognosis 02/04/2019 Prognosis  for Safe Diet Advancement Good Barriers to Reach Goals -- Barriers/Prognosis Comment -- CHL IP DIET RECOMMENDATION 02/04/2019 SLP Diet Recommendations Dysphagia 2 (Fine chop) solids;Honey thick liquids Liquid Administration via Cup;No straw Medication Administration Whole meds with puree Compensations Slow rate;Small sips/bites;Multiple dry swallows after each bite/sip;Hard cough after swallow;Clear throat intermittently Postural Changes Seated upright at 90 degrees;Remain semi-upright after after feeds/meals (Comment)   CHL IP OTHER RECOMMENDATIONS 02/04/2019 Recommended Consults -- Oral Care Recommendations Oral care BID Other Recommendations --   CHL IP FOLLOW UP RECOMMENDATIONS 02/04/2019 Follow up Recommendations Skilled Nursing facility   [facility] IP FREQUENCY AND DURATION 02/04/2019 Speech Therapy Frequency (ACUTE ONLY) min 2x/week Treatment Duration 2 weeks      CHL IP ORAL PHASE 02/04/2019 Oral Phase WFL Oral - Pudding Teaspoon -- Oral - Pudding Cup -- Oral - Honey Teaspoon -- Oral - Honey Cup WFL Oral - Nectar Teaspoon -- Oral - Nectar Cup WFL Oral - Nectar Straw -- Oral - Thin Teaspoon -- Oral - Thin Cup NT Oral - Thin Straw -- Oral - Puree NT Oral - Mech Soft -- Oral - Regular NT Oral - Multi-Consistency -- Oral - Pill -- Oral Phase -  Comment --  CHL IP PHARYNGEAL PHASE 02/04/2019 Pharyngeal Phase Impaired Pharyngeal- Pudding Teaspoon -- Pharyngeal -- Pharyngeal- Pudding Cup -- Pharyngeal -- Pharyngeal- Honey Teaspoon NT Pharyngeal -- Pharyngeal- Honey Cup Penetration/Aspiration during swallow;Pharyngeal residue - valleculae;Pharyngeal residue - pyriform;Reduced airway/laryngeal closure Pharyngeal Material enters airway, remains ABOVE vocal cords and not ejected out Pharyngeal- Nectar Teaspoon NT Pharyngeal -- Pharyngeal- Nectar Cup Penetration/Aspiration during swallow;Reduced airway/laryngeal closure;Pharyngeal residue - valleculae;Pharyngeal residue - pyriform Pharyngeal Material enters airway, passes BELOW  cords and not ejected out despite cough attempt by patient;Material enters airway, CONTACTS cords and not ejected out Pharyngeal- Nectar Straw -- Pharyngeal -- Pharyngeal- Thin Teaspoon NT Pharyngeal -- Pharyngeal- Thin Cup NT Pharyngeal -- Pharyngeal- Thin Straw -- Pharyngeal -- Pharyngeal- Puree NT Pharyngeal -- Pharyngeal- Mechanical Soft -- Pharyngeal -- Pharyngeal- Regular NT Pharyngeal -- Pharyngeal- Multi-consistency -- Pharyngeal -- Pharyngeal- Pill -- Pharyngeal -- Pharyngeal Comment --  CHL IP CERVICAL ESOPHAGEAL PHASE 02/04/2019 Cervical Esophageal Phase WFL Pudding Teaspoon -- Pudding Cup -- Honey Teaspoon -- Honey Cup -- Nectar Teaspoon -- Nectar Cup -- Nectar Straw -- Thin Teaspoon -- Thin Cup -- Thin Straw -- Puree -- Mechanical Soft -- Regular -- Multi-consistency -- Pill -- Cervical Esophageal Comment -- Royce Macadamia 02/04/2019, 10:23 AM Breck Coons [employer] administrator Pager [phone] Office [phone]              Dg Swallowing Func-speech Pathology  Result Date: 01/30/2019 Objective Swallowing Evaluation: Type of Study: MBS-Modified Barium Swallow Study  Patient Details Name: Antonio Bulman. MRN: [id number] Date of Birth: 09/08/1956 Today's Date: 01/30/2019 Time: SLP Start Time (ACUTE ONLY): 1328 -SLP Stop Time (ACUTE ONLY): 1348 SLP Time Calculation (min) (ACUTE ONLY): 20 min Past Medical History: Past Medical History: Diagnosis Date  Coronary artery disease   Diabetes mellitus without complication (HCC)   Hyperlipidemia   Hypertension   Osteomyelitis (HCC) 03/25/2017  RT FOOT Past Surgical History: Past Surgical History: Procedure Laterality Date  AMPUTATION Right 03/27/2017  Procedure: 1st Ray Amputation Right Foot;  Surgeon: Nadara Mustard, MD;  Location: [facility] OR;  Service: Orthopedics;  Laterality: Right;  APPENDECTOMY    BELOW KNEE LEG AMPUTATION Left   CARDIAC CATHETERIZATION    CORONARY STENT INTERVENTION  2005  Great toe amputation right.     I&D EXTREMITY Left 12/30/2015  Procedure: IRRIGATION AND DEBRIDEMENT LEFT FOOT, TRANSMETATARSAL AMPUTATION WITH APPLICATION OF ANTIBIOTIC BEADS AND WOUND VAC;  Surgeon: Nadara Mustard, MD;  Location: [facility] OR;  Service: Orthopedics;  Laterality: Left;  IR GASTROSTOMY TUBE MOD SED  12/23/2018  TONSILLECTOMY    TRACHEOSTOMY  11/2018 HPI: 63 year old male coming in with fever cough chest pain and shortness of breath was found to have influenza A. PMH: diabetes mellitus, hypertension.  Intubated multiple times 1/3 to 1/16 with trach placed 1/16. Repeat MBS on 3/28 for possible initiation of po's.  Subjective: pt is alert and cooperative but needs encouragement Assessment / Plan / Recommendation CHL IP CLINICAL IMPRESSIONS 01/30/2019 Clinical Impression Pt's trach was capped this morning and during MBS with normal oropharyngeal pressures. Swallow function has improved from prior study despite silent aspiration with thin and penetration of nectar and honey. Laryngeal excursion, pharyngeal contraction and epiglottic inversion are not of normal strength and ROM however improved from last week. Incomplete closure led to penetration with thin, nectar and only flash with honey thick x 1. Chin tuck did not prevent penetration (silent) with nectar and vallecular and pyriform sinus residue present and was mildly increased  with honey thick. Verbal cues to perform second swallow reduced volume. Puree was not penetrated this study. Recommend Dys 2 given mild oral residue, impulsivity and deconditioning. Honey thick liquids recommended with two swallows, intermittent throat clears and pills whole in applesauce. ST will continue to follow for safety and upgrade of po textures. SLP Visit Diagnosis Dysphagia, oropharyngeal phase (R13.12) Attention and concentration deficit following -- Frontal lobe and executive function deficit following -- Impact on safety and function Moderate aspiration risk;Severe aspiration risk   CHL IP TREATMENT  RECOMMENDATION 01/30/2019 Treatment Recommendations Therapy as outlined in treatment plan below   Prognosis 01/30/2019 Prognosis for Safe Diet Advancement Good Barriers to Reach Goals -- Barriers/Prognosis Comment -- CHL IP DIET RECOMMENDATION 01/30/2019 SLP Diet Recommendations Honey thick liquids;Dysphagia 2 (Fine chop) solids Liquid Administration via Cup;No straw Medication Administration Whole meds with puree Compensations Slow rate;Small sips/bites;Multiple dry swallows after each bite/sip;Clear throat intermittently Postural Changes Seated upright at 90 degrees   CHL IP OTHER RECOMMENDATIONS 01/30/2019 Recommended Consults -- Oral Care Recommendations Oral care BID Other Recommendations --   CHL IP FOLLOW UP RECOMMENDATIONS 01/30/2019 Follow up Recommendations (No Data)   CHL IP FREQUENCY AND DURATION 01/30/2019 Speech Therapy Frequency (ACUTE ONLY) min 2x/week Treatment Duration 2 weeks      CHL IP ORAL PHASE 01/30/2019 Oral Phase Impaired Oral - Pudding Teaspoon -- Oral - Pudding Cup -- Oral - Honey Teaspoon -- Oral - Honey Cup Lingual/palatal residue Oral - Nectar Teaspoon -- Oral - Nectar Cup Lingual/palatal residue Oral - Nectar Straw -- Oral - Thin Teaspoon -- Oral - Thin Cup -- Oral - Thin Straw -- Oral - Puree Lingual/palatal residue Oral - Mech Soft -- Oral - Regular WFL Oral - Multi-Consistency -- Oral - Pill -- Oral Phase - Comment --  CHL IP PHARYNGEAL PHASE 01/30/2019 Pharyngeal Phase Impaired Pharyngeal- Pudding Teaspoon -- Pharyngeal -- Pharyngeal- Pudding Cup -- Pharyngeal -- Pharyngeal- Honey Teaspoon NT Pharyngeal -- Pharyngeal- Honey Cup Pharyngeal residue - valleculae;Pharyngeal residue - pyriform;Reduced epiglottic inversion;Penetration/Aspiration during swallow;Reduced airway/laryngeal closure Pharyngeal Material enters airway, remains ABOVE vocal cords then ejected out Pharyngeal- Nectar Teaspoon NT Pharyngeal -- Pharyngeal- Nectar Cup Penetration/Aspiration during swallow;Reduced airway/laryngeal  closure;Reduced epiglottic inversion;Pharyngeal residue - valleculae;Pharyngeal residue - pyriform;Compensatory strategies attempted (with notebox) Pharyngeal Material enters airway, remains ABOVE vocal cords and not ejected out Pharyngeal- Nectar Straw -- Pharyngeal -- Pharyngeal- Thin Teaspoon NT Pharyngeal -- Pharyngeal- Thin Cup Penetration/Aspiration during swallow;Pharyngeal residue - valleculae;Pharyngeal residue - pyriform;Moderate aspiration;Trace aspiration;Reduced airway/laryngeal closure;Reduced epiglottic inversion Pharyngeal Material enters airway, passes BELOW cords without attempt by patient to eject out (silent aspiration) Pharyngeal- Thin Straw -- Pharyngeal -- Pharyngeal- Puree Pharyngeal residue - pyriform;Pharyngeal residue - valleculae;Reduced epiglottic inversion Pharyngeal Material does not enter airway Pharyngeal- Mechanical Soft -- Pharyngeal -- Pharyngeal- Regular WFL Pharyngeal -- Pharyngeal- Multi-consistency -- Pharyngeal -- Pharyngeal- Pill -- Pharyngeal -- Pharyngeal Comment --  CHL IP CERVICAL ESOPHAGEAL PHASE 01/30/2019 Cervical Esophageal Phase WFL Pudding Teaspoon -- Pudding Cup -- Honey Teaspoon -- Honey Cup -- Nectar Teaspoon -- Nectar Cup -- Nectar Straw -- Thin Teaspoon -- Thin Cup -- Thin Straw -- Puree -- Mechanical Soft -- Regular -- Multi-consistency -- Pill -- Cervical Esophageal Comment -- Royce Macadamia 01/30/2019, 2:53 PM Breck Coons Litaker M.Marikay Alar Pager [phone] Office [phone]              Dg Swallowing Func-speech Pathology  Result Date: 01/23/2019 Objective Swallowing Evaluation: Type of Study: MBS-Modified Barium Swallow Study  Patient Details  Name: Antonio Carel [facility]. MRN: [id number] Date of Birth: 1956/07/15 Today's Date: 01/23/2019 Time: SLP Start Time (ACUTE ONLY): 1254 -SLP Stop Time (ACUTE ONLY): 1308 SLP Time Calculation (min) (ACUTE ONLY): 14 min Past Medical History: Past Medical History: Diagnosis Date  Coronary  artery disease   Diabetes mellitus without complication (HCC)   Hyperlipidemia   Hypertension   Osteomyelitis (HCC) 03/25/2017  RT FOOT Past Surgical History: Past Surgical History: Procedure Laterality Date  AMPUTATION Right 03/27/2017  Procedure: 1st Ray Amputation Right Foot;  Surgeon: Nadara Mustard, MD;  Location: [facility];  Service: Orthopedics;  Laterality: Right;  APPENDECTOMY    BELOW KNEE LEG AMPUTATION Left   CARDIAC CATHETERIZATION    CORONARY STENT INTERVENTION  2005  Great toe amputation right.    I&D EXTREMITY Left 12/30/2015  Procedure: IRRIGATION AND DEBRIDEMENT LEFT FOOT, TRANSMETATARSAL AMPUTATION WITH APPLICATION OF ANTIBIOTIC BEADS AND WOUND VAC;  Surgeon: Nadara Mustard, MD;  Location: [facility] OR;  Service: Orthopedics;  Laterality: Left;  IR GASTROSTOMY TUBE MOD SED  12/23/2018  TONSILLECTOMY    TRACHEOSTOMY  11/2018 HPI: 63 year old male coming in with fever cough chest pain and shortness of breath was found to have influenza A. PMH: diabetes mellitus, hypertension.  Intubated multiple times 1/3 to 1/16 with trach placed 1/16.   Subjective: pt is alert and cooperative but needs encouragement Assessment / Plan / Recommendation CHL IP CLINICAL IMPRESSIONS 01/23/2019 Clinical Impression Pt has a moderate pharyngeal dysphagia due to reduced strength and airway closure s/p prolonged (and repeated) intubations. His timing for swallow trigger is good, but he has reduced hyolaryngeal movement, pharyngeal squeeze, epiglottic inversion, and tonge base retraction. Thin and nectar thick liquids enter the laryngeal vestibule during the swallow because of his incomplete closure, with penetration intermittently reaching the true vocal folds without spontaneous cough elicited. Multiple cued coughs were required to eject penetrates. Honey thick liquids and purees left more residue in the valleculae and pyriform sinuses, with intermittent penetration after the swallow from the residual material. Recommend he  remain NPO except for initiating small amounts of purees and spoonfuls of thickened liquids during SLP visits only to facilitate restrengthening. Pt may also benefit from EMST and other exercises to progress strength of cough and pharyngeal musculature.   SLP Visit Diagnosis Dysphagia, pharyngeal phase (R13.13) Attention and concentration deficit following -- Frontal lobe and executive function deficit following -- Impact on safety and function Moderate aspiration risk   CHL IP TREATMENT RECOMMENDATION 01/23/2019 Treatment Recommendations Therapy as outlined in treatment plan below   Prognosis 01/23/2019 Prognosis for Safe Diet Advancement Good Barriers to Reach Goals -- Barriers/Prognosis Comment -- CHL IP DIET RECOMMENDATION 01/23/2019 SLP Diet Recommendations NPO Liquid Administration via -- Medication Administration Via alternative means Compensations -- Postural Changes --   CHL IP OTHER RECOMMENDATIONS 01/23/2019 Recommended Consults -- Oral Care Recommendations Oral care QID Other Recommendations Have oral suction available   CHL IP FOLLOW UP RECOMMENDATIONS 01/23/2019 Follow up Recommendations Inpatient Rehab   CHL IP FREQUENCY AND DURATION 01/23/2019 Speech Therapy Frequency (ACUTE ONLY) min 2x/week Treatment Duration 2 weeks      CHL IP ORAL PHASE 01/23/2019 Oral Phase WFL Oral - Pudding Teaspoon -- Oral - Pudding Cup -- Oral - Honey Teaspoon -- Oral - Honey Cup -- Oral - Nectar Teaspoon -- Oral - Nectar Cup -- Oral - Nectar Straw -- Oral - Thin Teaspoon -- Oral - Thin Cup -- Oral - Thin Straw -- Oral - Puree -- Oral - Mech Soft --  Oral - Regular -- Oral - Multi-Consistency -- Oral - Pill -- Oral Phase - Comment --  CHL IP PHARYNGEAL PHASE 01/23/2019 Pharyngeal Phase Impaired Pharyngeal- Pudding Teaspoon -- Pharyngeal -- Pharyngeal- Pudding Cup -- Pharyngeal -- Pharyngeal- Honey Teaspoon Reduced pharyngeal peristalsis;Reduced epiglottic inversion;Reduced anterior laryngeal mobility;Reduced laryngeal  elevation;Reduced airway/laryngeal closure;Reduced tongue base retraction;Pharyngeal residue - valleculae;Pharyngeal residue - pyriform Pharyngeal -- Pharyngeal- Honey Cup -- Pharyngeal -- Pharyngeal- Nectar Teaspoon Reduced pharyngeal peristalsis;Reduced epiglottic inversion;Reduced anterior laryngeal mobility;Reduced laryngeal elevation;Reduced airway/laryngeal closure;Reduced tongue base retraction;Pharyngeal residue - valleculae;Pharyngeal residue - pyriform;Penetration/Aspiration during swallow Pharyngeal Material enters airway, remains ABOVE vocal cords and not ejected out Pharyngeal- Nectar Cup Reduced pharyngeal peristalsis;Reduced epiglottic inversion;Reduced anterior laryngeal mobility;Reduced laryngeal elevation;Reduced airway/laryngeal closure;Reduced tongue base retraction;Pharyngeal residue - valleculae;Pharyngeal residue - pyriform;Penetration/Aspiration during swallow Pharyngeal Material enters airway, remains ABOVE vocal cords and not ejected out Pharyngeal- Nectar Straw -- Pharyngeal -- Pharyngeal- Thin Teaspoon Reduced pharyngeal peristalsis;Reduced epiglottic inversion;Reduced anterior laryngeal mobility;Reduced laryngeal elevation;Reduced airway/laryngeal closure;Reduced tongue base retraction;Penetration/Aspiration during swallow Pharyngeal Material enters airway, CONTACTS cords and not ejected out Pharyngeal- Thin Cup -- Pharyngeal -- Pharyngeal- Thin Straw -- Pharyngeal -- Pharyngeal- Puree Reduced pharyngeal peristalsis;Reduced epiglottic inversion;Reduced anterior laryngeal mobility;Reduced laryngeal elevation;Reduced airway/laryngeal closure;Reduced tongue base retraction;Pharyngeal residue - valleculae;Pharyngeal residue - pyriform;Penetration/Apiration after swallow Pharyngeal Material enters airway, remains ABOVE vocal cords and not ejected out Pharyngeal- Mechanical Soft -- Pharyngeal -- Pharyngeal- Regular -- Pharyngeal -- Pharyngeal- Multi-consistency -- Pharyngeal -- Pharyngeal-  Pill -- Pharyngeal -- Pharyngeal Comment --  CHL IP CERVICAL ESOPHAGEAL PHASE 01/23/2019 Cervical Esophageal Phase WFL Pudding Teaspoon -- Pudding Cup -- Honey Teaspoon -- Honey Cup -- Nectar Teaspoon -- Nectar Cup -- Nectar Straw -- Thin Teaspoon -- Thin Cup -- Thin Straw -- Puree -- Mechanical Soft -- Regular -- Multi-consistency -- Pill -- Cervical Esophageal Comment -- Virl Axe Nix 01/23/2019, 2:32 PM  [name], M.A. CCC-SLP Acute Rehabilitation Services Pager [phone] Office [phone]               LOS: 79 days   Jeoffrey Massed, MD  [facility]  If 7PM-7AM, please contact night-coverage  Please page via [email or web address]  Go to [email or web address] and use [facility]'s universal password to access. If you do not have the password, please contact the hospital operator.  Locate the [facility] provider you are looking for under [facility] and page to a number that you can be directly reached. If you still have difficulty reaching the provider, please page the [facility] (Director on Call) for the Hospitalists listed on amion for assistance.  02/15/2019, 12:23 PM

## 2019-02-16 LAB — GLUCOSE, CAPILLARY
GLUCOSE-CAPILLARY: 139 mg/dL — AB (ref 70–99)
Glucose-Capillary: 153 mg/dL — ABNORMAL HIGH (ref 70–99)
Glucose-Capillary: 145 mg/dL — ABNORMAL HIGH (ref 70–99)
Glucose-Capillary: 125 mg/dL — ABNORMAL HIGH (ref 70–99)

## 2019-02-16 NOTE — Progress Notes (Signed)
PROGRESS NOTE        PATIENT DETAILS Name: Antonio [name]. Age: 63 y.o. Sex: male Date of Birth: 1956/08/18 Admit Date: 11/28/2018 Admitting Physician Reyes Ivan, MD ZOX:WRUE, Orie Rout, NP  Brief Narrative: Patient is a 63 y.o. male admitted on 1/3 with influenza A causing acute hypoxic respiratory failure, cardiogenic shock-required prolonged mechanical ventilation-is currently s/p tracheostomy and PEG tube placement.  Significant events: 1/03  Admit, Influenza A positive 1/06  Self extubated  1/07  Re-intubated  1/08  Self-extubated  1/16 tracheostomy 1/21  Trach revision to XLT distal 1/28 PEG tube 2/03  Weaned 15 minutes  2/11  ATC  2/14  Trach changed to #4, mucus plugging >back to #5 XLT 2/15  Failed ATC with increased WOB 2/16  Failed ATC with increased WOB  2/21 back on pressure support 2/24 back on TC  01/27/19 - 5 XLT cuffless trach  01/29/2019: Size 4 cuff less tracheostomy placed 01/30/2019: Capping trials initiated 02/02/2019: Tracheostomy decannulated  Subjective: Lying comfortably in bed-no chest pain or shortness of breath.  Awaiting SNF placement  Assessment/Plan: Fever: Afebrile over the past few days-chest x-ray without any infiltrate, blood culture and urine culture on 3/11 negative.  Currently off all antimicrobial therapy-continue to monitor.  Stage IV sacral/coccygeal ulceration: Continue with hydrotherapy while inpatient-once discharge-General surgery recommending Dakins twice daily for 3 days. Patient will need twice daily wet to dry dressing changes with normal saline dampened Kerlix packed in the wound (make sure to get areas of tunneling) with dry 4x4 over this and abd pad covering wound.   Acute hypoxic respiratory failure secondary to influenza A, pseudomonal/Corynebacterium pneumonia: Prolonged hospitalization requiring prolonged ventilator support-subsequently tracheostomy on 1/16.  Currently decannulated on  3/9-Per last PCCM note-if the wound does not close in 2 weeks-may need evaluation for tracheocutaneous fistula.  Acute systolic heart failure (EF 20-25% on 1/4, EF 60-65% by TTE on 1/29): Compensated-continue beta-blocker.  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  Cardiology recommending further work-up to be done in the outpatient setting.  Dysphagia: Speech therapy following-recommendations were for a dysphagia 2 diet-however patient noncompliant-and as a result SLP has upgraded to a regular diet.  No signs of aspiration at this point.  Metabolic encephalopathy: Resolved.  Etiology felt to be secondary to hypoxia/pneumonia.  DM-2: CBG stable continue Levemir 25 units twice daily and SSI.  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  Chronic pain syndrome: Continue Nucynta and as needed oxycodone  Debility/deconditioning: Secondary to acute illness-awaiting SNF bed  DVT Prophylaxis: Prophylactic Lovenox   Code Status: Full code  Family Communication: None at bedside  Disposition Plan: Remain inpatient-SNF on discharge  Antimicrobial agents: Anti-infectives (From admission, onward)   Start     Dose/Rate Route Frequency Ordered Stop   02/04/19 1300  vancomycin (VANCOCIN) 1,250 mg in sodium chloride 0.9 % 250 mL IVPB  Status:  Discontinued     1,250 mg 166.7 mL/hr over 90 Minutes Intravenous Every 12 hours 02/04/19 1200 02/06/19 1002   02/04/19 1200  metroNIDAZOLE (FLAGYL) IVPB 500 mg  Status:  Discontinued     500 mg 100 mL/hr over 60 Minutes Intravenous Every 8 hours 02/04/19 1146 02/06/19 1002   02/04/19 0930  ceFEPIme (MAXIPIME) 2 g in sodium chloride 0.9 % 100 mL IVPB     2 g  200 mL/hr over 30 Minutes Intravenous Every 8 hours 02/04/19 0926 02/08/19 2359   02/03/19 1200  meropenem (MERREM) 1 g in sodium chloride 0.9 % 100 mL IVPB  Status:  Discontinued     1 g 200 mL/hr over 30 Minutes Intravenous Every 8 hours 02/03/19  1054 02/04/19 0924   01/19/19 1400  cefTAZidime (FORTAZ) 2 g in sodium chloride 0.9 % 100 mL IVPB     2 g 200 mL/hr over 30 Minutes Intravenous Every 8 hours 01/19/19 1120 01/24/19 2359   01/15/19 1500  cefTAZidime (FORTAZ) 2 g in sodium chloride 0.9 % 100 mL IVPB     2 g 200 mL/hr over 30 Minutes Intravenous Every 8 hours 01/15/19 1448 01/19/19 0542   01/14/19 1400  colistimethate ((COLYMYCIN-M)) nebulized solution 150 mg  Status:  Discontinued     150 mg Inhalation 3 times daily 01/14/19 1142 01/19/19 1119   01/14/19 1300  meropenem (MERREM) 1 g in sodium chloride 0.9 % 100 mL IVPB  Status:  Discontinued     1 g 200 mL/hr over 30 Minutes Intravenous Every 8 hours 01/14/19 1235 01/15/19 1448   12/23/18 1506  vancomycin (VANCOCIN) 1-5 GM/200ML-% IVPB    Note to Pharmacy:  [facility]   : cabinet override      12/23/18 1506 12/23/18 1515   12/20/18 2200  vancomycin (VANCOCIN) IVPB 1000 mg/200 mL premix  Status:  Discontinued     1,000 mg 200 mL/hr over 60 Minutes Intravenous Every 12 hours 12/20/18 1804 12/22/18 1413   12/18/18 0800  ceFEPIme (MAXIPIME) 1 g in sodium chloride 0.9 % 100 mL IVPB  Status:  Discontinued     1 g 200 mL/hr over 30 Minutes Intravenous Every 8 hours 12/18/18 0713 12/19/18 1201   12/17/18 0530  vancomycin (VANCOCIN) 1,500 mg in sodium chloride 0.9 % 500 mL IVPB  Status:  Discontinued     1,500 mg 250 mL/hr over 120 Minutes Intravenous Every 12 hours 12/16/18 1704 12/20/18 1804   12/16/18 1730  cefTAZidime (FORTAZ) 2 g in sodium chloride 0.9 % 100 mL IVPB  Status:  Discontinued     2 g 200 mL/hr over 30 Minutes Intravenous Every 8 hours 12/16/18 1704 12/18/18 0702   12/16/18 1730  vancomycin (VANCOCIN) 2,000 mg in sodium chloride 0.9 % 500 mL IVPB     2,000 mg 250 mL/hr over 120 Minutes Intravenous  Once 12/16/18 1704 12/16/18 2030   12/07/18 1300  erythromycin 500 mg in sodium chloride 0.9 % 100 mL IVPB  Status:  Discontinued     500 mg 100 mL/hr over 60  Minutes Intravenous Every 6 hours 12/07/18 1118 12/16/18 1627   12/06/18 1500  vancomycin (VANCOCIN) 1,250 mg in sodium chloride 0.9 % 250 mL IVPB  Status:  Discontinued     1,250 mg 166.7 mL/hr over 90 Minutes Intravenous Every 12 hours 12/06/18 1436 12/07/18 1118   12/06/18 0100  vancomycin (VANCOCIN) 1,250 mg in sodium chloride 0.9 % 250 mL IVPB     1,250 mg 166.7 mL/hr over 90 Minutes Intravenous  Once 12/05/18 1054 12/06/18 0146   12/05/18 1200  vancomycin (VANCOCIN) 2,000 mg in sodium chloride 0.9 % 500 mL IVPB     2,000 mg 250 mL/hr over 120 Minutes Intravenous  Once 12/05/18 1054 12/05/18 1350   12/05/18 1200  cefTAZidime (FORTAZ) 1 g in sodium chloride 0.9 % 100 mL IVPB  Status:  Discontinued     1 g 200 mL/hr over 30 Minutes  Intravenous Every 8 hours 12/05/18 1106 12/07/18 1118   11/30/18 2200  oseltamivir (TAMIFLU) 6 MG/ML suspension 30 mg     30 mg Per Tube 2 times daily 11/30/18 1348 12/03/18 2159   11/30/18 2200  ceFEPIme (MAXIPIME) 1 g in sodium chloride 0.9 % 100 mL IVPB  Status:  Discontinued     1 g 200 mL/hr over 30 Minutes Intravenous Every 12 hours 11/30/18 1350 12/01/18 1039   11/29/18 2200  ceFEPIme (MAXIPIME) 2 g in sodium chloride 0.9 % 100 mL IVPB  Status:  Discontinued     2 g 200 mL/hr over 30 Minutes Intravenous Every 12 hours 11/29/18 1645 11/30/18 1350   11/29/18 1100  oseltamivir (TAMIFLU) 6 MG/ML suspension 75 mg  Status:  Discontinued     75 mg Per Tube 2 times daily 11/29/18 1000 11/30/18 1348   11/28/18 2300  oseltamivir (TAMIFLU) capsule 75 mg  Status:  Discontinued     75 mg Oral 2 times daily 11/28/18 2238 11/29/18 1000   11/28/18 2030  oseltamivir (TAMIFLU) capsule 75 mg     75 mg Oral  Once 11/28/18 2029 11/28/18 2207   11/28/18 1745  vancomycin (VANCOCIN) 2,000 mg in sodium chloride 0.9 % 500 mL IVPB     2,000 mg 250 mL/hr over 120 Minutes Intravenous  Once 11/28/18 1741 11/28/18 2203   11/28/18 1745  piperacillin-tazobactam (ZOSYN) IVPB 3.375 g      3.375 g 100 mL/hr over 30 Minutes Intravenous  Once 11/28/18 1741 11/28/18 1834      Procedures: See above  CONSULTS:  cardiology, pulmonary/intensive care and general surgery  Time spent: 15 minutes-Greater than 50% of this time was spent in counseling, explanation of diagnosis, planning of further management, and coordination of care.  MEDICATIONS: Scheduled Meds:  arformoterol  15 mcg Nebulization BID   aspirin  81 mg Per Tube Daily   atorvastatin  40 mg Per Tube q1800   budesonide (PULMICORT) nebulizer solution  0.5 mg Nebulization BID   carvedilol  3.125 mg Per Tube BID WC   collagenase   Topical Daily   enoxaparin (LOVENOX) injection  40 mg Subcutaneous Q24H   famotidine  20 mg Per Tube BID   feeding supplement (PRO-STAT SUGAR FREE 64)  30 mL Oral BID   folic acid  1 mg Per Tube Daily   guaiFENesin  5 mL Per Tube Q12H   insulin aspart  0-20 Units Subcutaneous TID WC   insulin detemir  25 Units Subcutaneous BID   multivitamin with minerals  1 tablet Oral Daily   polyethylene glycol  17 g Per Tube Daily   tapentadol  50 mg Oral QID   thiamine  100 mg Per Tube Daily   Continuous Infusions:  sodium chloride 10 mL/hr at 01/24/19 2000   PRN Meds:.sodium chloride, acetaminophen (TYLENOL) oral liquid 160 mg/5 mL, albuterol, alum & mag hydroxide-simeth **AND** lidocaine, bisacodyl, clonazePAM, docusate, ipratropium-albuterol, oxyCODONE, Resource ThickenUp Clear, senna   PHYSICAL EXAM: Vital signs: Vitals:   02/15/19 1938 02/15/19 2212 02/16/19 0529 02/16/19 0900  BP:  (!) 160/82 132/69   Pulse:  87 83   Resp:      Temp:  98.9 F (37.2 C) 98.1 F (36.7 C)   TempSrc:  Oral Oral   SpO2: 95% 90% 92% 93%  Weight:      Height:       Filed Weights   02/11/19 0547 02/12/19 0542 02/13/19 0436  Weight: 81.2 kg 87 kg 63 kg  Body mass index is 18.84 kg/m.   General appearance :Awake, alert, not in any distress.  HEENT: Atraumatic and  Normocephalic Resp:Good air entry bilaterally, no added sounds  CVS: S1 S2 regular, no murmurs.  GI: Bowel sounds present, Non tender and not distended with no gaurding, rigidity or rebound.No organomegaly Extremities: Left AKA Neurology: Nonfocal Musculoskeletal:No digital cyanosis Skin:No Rash, warm and dry  I have personally reviewed following labs and imaging studies  LABORATORY DATA: CBC: Recent Labs  Lab 02/11/19 0320 02/15/19 0455  WBC 7.1 10.1  NEUTROABS 3.9  --   HGB 10.9* 11.3*  HCT 36.0* 36.2*  MCV 85.7 86.2  PLT 271 264    Basic Metabolic Panel: Recent Labs  Lab 02/11/19 0320 02/15/19 0455  NA 136 136  K 4.5 4.0  CL 97* 97*  CO2 34* 30  GLUCOSE 180* 130*  BUN 16 25*  CREATININE 0.53* 0.59*  CALCIUM 8.8* 9.1  MG 1.7  --     GFR: Estimated Creatinine Clearance: 86.4 mL/min (A) (by C-G formula based on SCr of 0.59 mg/dL (L)).  Liver Function Tests: No results for input(s): AST, ALT, ALKPHOS, BILITOT, PROT, ALBUMIN in the last 168 hours. No results for input(s): LIPASE, AMYLASE in the last 168 hours. No results for input(s): AMMONIA in the last 168 hours.  Coagulation Profile: No results for input(s): INR, PROTIME in the last 168 hours.  Cardiac Enzymes: No results for input(s): CKTOTAL, CKMB, CKMBINDEX, TROPONINI in the last 168 hours.  BNP (last 3 results) No results for input(s): PROBNP in the last 8760 hours.  HbA1C: No results for input(s): HGBA1C in the last 72 hours.  CBG: Recent Labs  Lab 02/14/19 2046 02/15/19 0747 02/15/19 1246 02/15/19 1709 02/16/19 0915  GLUCAP 140* 116* 142* 184* 153*    Lipid Profile: No results for input(s): CHOL, HDL, LDLCALC, TRIG, CHOLHDL, LDLDIRECT in the last 72 hours.  Thyroid Function Tests: No results for input(s): TSH, T4TOTAL, FREET4, T3FREE, THYROIDAB in the last 72 hours.  Anemia Panel: No results for input(s): VITAMINB12, FOLATE, FERRITIN, TIBC, IRON, RETICCTPCT in the last 72  hours.  Urine analysis:    Component Value Date/Time   COLORURINE YELLOW 02/04/2019 1400   APPEARANCEUR HAZY (A) 02/04/2019 1400   LABSPEC 1.028 02/04/2019 1400   PHURINE 5.0 02/04/2019 1400   GLUCOSEU 150 (A) 02/04/2019 1400   HGBUR NEGATIVE 02/04/2019 1400   BILIRUBINUR NEGATIVE 02/04/2019 1400   KETONESUR NEGATIVE 02/04/2019 1400   PROTEINUR 100 (A) 02/04/2019 1400   NITRITE NEGATIVE 02/04/2019 1400   LEUKOCYTESUR NEGATIVE 02/04/2019 1400    Sepsis Labs: Lactic Acid, Venous    Component Value Date/Time   LATICACIDVEN 1.3 01/09/2019 0620    MICROBIOLOGY: No results found for this or any previous visit (from the past 240 hour(s)).  RADIOLOGY STUDIES/RESULTS: Mr Lumbar Spine W Wo Contrast  Result Date: 02/04/2019 CLINICAL DATA:  Coccygeal wound infection. EXAM: MRI LUMBAR SPINE WITHOUT AND WITH CONTRAST TECHNIQUE: Multiplanar and multiecho pulse sequences of the lumbar spine were obtained without and with intravenous contrast. CONTRAST:  7 mL Gadavist COMPARISON:  None. FINDINGS: Segmentation: Normal. The lowest disc space is considered to be L5-S1. Alignment:  Normal Vertebrae: There is no bone marrow edema. No compression fracture. No abnormal contrast enhancement. Normal T1-weighted signal is preserved throughout the visualized spine and sacrum. The coccyx is outside of the field of view. Conus medullaris and cauda equina: The conus medullaris terminates at the L1 level. The cauda equina and conus medullaris are  both normal. Paraspinal and other soft tissues: The visualized retroperitoneal organs and paraspinal soft tissues are normal. Disc levels: Sagittal plane imaging includes the T12-L1 disc level through the upper sacrum, with axial imaging of the L1-2 to L5-S1 disc levels. T12-L1: Normal. L1-2: Normal. L2-3: Disc desiccation with small bulge and superimposed central protrusion. No central spinal canal stenosis. No neural foraminal stenosis. Normal facets. L3-4: Disc  desiccation and small central protrusion. No spinal canal or neural foraminal stenosis. L4-5: Minimal disc bulge no spinal canal or neural foraminal stenosis. L5-S1: Central/right subarticular disc protrusion with narrowing of the right lateral recess. No central spinal canal stenosis or neural foraminal stenosis. The visualized portion of the sacrum is normal. IMPRESSION: 1. No evidence of active osteomyelitis of the lumbar spine or sacrum. Please note that the coccyx is outside the field of view. 2. No fluid collection or other focal abnormality of the soft tissues dorsal to the spine. 3. Multilevel mild degenerative disc disease. Mild narrowing of the right L5-S1 lateral recess could cause radicular symptoms in a right S1 distribution. Electronically Signed   By: Deatra Robinson M.D.   On: 02/04/2019 23:06   Dg Chest Port 1 View  Result Date: 02/04/2019 CLINICAL DATA:  Dyspnea. EXAM: PORTABLE CHEST 1 VIEW COMPARISON:  One-view chest x-ray 02/03/2019 FINDINGS: The heart is mildly enlarged. Mild pulmonary vascular congestion is stable. Elevation of the right hemidiaphragm is again noted. There is some volume loss on the right with associated atelectasis. No significant airspace consolidation is present. A small right effusion is not excluded. IMPRESSION: 1. Mild pulmonary vascular congestion without frank edema. 2. Similar appearance of right basilar atelectasis. Electronically Signed   By: Marin Roberts M.D.   On: 02/04/2019 08:31   Dg Chest Port 1 View  Result Date: 02/03/2019 CLINICAL DATA:  Shortness of breath EXAM: PORTABLE CHEST 1 VIEW COMPARISON:  01/19/2019 FINDINGS: Cardiac shadows within normal limits. Elevation of the right hemidiaphragm is noted with right basilar atelectasis. No other focal infiltrate is seen. Gastrostomy tube is noted within the stomach. IMPRESSION: Mild right basilar atelectasis. Electronically Signed   By: Alcide Clever M.D.   On: 02/03/2019 08:24   Dg Chest Port 1  View  Result Date: 01/19/2019 CLINICAL DATA:  Acute respiratory failure EXAM: PORTABLE CHEST 1 VIEW COMPARISON:  01/15/2019 FINDINGS: Cardiac shadow is stable. Left lung remains clear. Endotracheal tube is noted in satisfactory position. Right basilar atelectasis is noted increased from the prior exam. No pneumothorax is seen. No acute bony abnormality is noted. IMPRESSION: Increasing right basilar atelectasis. Electronically Signed   By: Alcide Clever M.D.   On: 01/19/2019 07:23   Dg Swallowing Func-speech Pathology  Result Date: 02/04/2019 Objective Swallowing Evaluation: Type of Study: MBS-Modified Barium Swallow Study  Patient Details Name: Antonio [name]. MRN: [id number] Date of Birth: Oct 07, 1956 Today's Date: 02/04/2019 Time: SLP Start Time (ACUTE ONLY): 0920 -SLP Stop Time (ACUTE ONLY): 0931 SLP Time Calculation (min) (ACUTE ONLY): 11 min Past Medical History: Past Medical History: Diagnosis Date  Coronary artery disease   Diabetes mellitus without complication (HCC)   Hyperlipidemia   Hypertension   Osteomyelitis (HCC) 03/25/2017  RT FOOT Past Surgical History: Past Surgical History: Procedure Laterality Date  AMPUTATION Right 03/27/2017  Procedure: 1st Ray Amputation Right Foot;  Surgeon: Nadara Mustard, MD;  Location: [facility] OR;  Service: Orthopedics;  Laterality: Right;  APPENDECTOMY    BELOW KNEE LEG AMPUTATION Left   CARDIAC CATHETERIZATION    CORONARY STENT INTERVENTION  2005  Great toe amputation right.    I&D EXTREMITY Left 12/30/2015  Procedure: IRRIGATION AND DEBRIDEMENT LEFT FOOT, TRANSMETATARSAL AMPUTATION WITH APPLICATION OF ANTIBIOTIC BEADS AND WOUND VAC;  Surgeon: Nadara Mustard, MD;  Location: [facility] OR;  Service: Orthopedics;  Laterality: Left;  IR GASTROSTOMY TUBE MOD SED  12/23/2018  TONSILLECTOMY    TRACHEOSTOMY  11/2018 HPI: 63 year old male coming in with fever cough chest pain and shortness of breath was found to have influenza A. PMH: diabetes mellitus, hypertension.   Intubated multiple times 1/3 to 1/16 with trach placed 1/16. Repeat MBS on 3/28 for possible initiation of po's. This is pt's 3rd MBS. Plan to repeat to determine upgrade initially; on 3/10 concern pt may have aspirated and made NPO.   Subjective: pt is alert and cooperative but needs encouragement Assessment / Plan / Recommendation CHL IP CLINICAL IMPRESSIONS 02/04/2019 Clinical Impression Pt has been decannulated since 3/9 with evidence of incomplete stoma closure indicated by breathy vocal quality. He continues to exhibit moderate-severe pharyngeal dysphagia noteably due to incompetent laryngeal closure and poor retroflexion of epiglottis. Honey thick penetrated vestibule with initial swallow of MBS and expelled with cues for cough and was not penetrated with subsequent trial. Nectar thick entered vestibule with penetration aspiration score (PAS) of 3, 5 & 7 (did not exit trachea with cough). Chin tuck actually facilitated entrance of barium into trachea and swallow with head in neutral applying pressure to gauze/stoma to increase pressure was ineffective. Mild-mod amount of vallecular and pyriform sinus residue not significantly improved. Recommend pt resume po's of honey thick liquids, texture of Dys 2, multiple swallows and hard coughs/throat clear after every other bite/sip. Pt should continue to receive ST at skilled nursing facility for continued intervention and repeat MBS when appropriate (not before 6-8 weeks). Will continue to follow while here.  SLP Visit Diagnosis Dysphagia, pharyngeal phase (R13.13) Attention and concentration deficit following -- Frontal lobe and executive function deficit following -- Impact on safety and function Severe aspiration risk   CHL IP TREATMENT RECOMMENDATION 02/04/2019 Treatment Recommendations Therapy as outlined in treatment plan below   Prognosis 02/04/2019 Prognosis for Safe Diet Advancement Good Barriers to Reach Goals -- Barriers/Prognosis Comment -- CHL IP DIET  RECOMMENDATION 02/04/2019 SLP Diet Recommendations Dysphagia 2 (Fine chop) solids;Honey thick liquids Liquid Administration via Cup;No straw Medication Administration Whole meds with puree Compensations Slow rate;Small sips/bites;Multiple dry swallows after each bite/sip;Hard cough after swallow;Clear throat intermittently Postural Changes Seated upright at 90 degrees;Remain semi-upright after after feeds/meals (Comment)   CHL IP OTHER RECOMMENDATIONS 02/04/2019 Recommended Consults -- Oral Care Recommendations Oral care BID Other Recommendations --   CHL IP FOLLOW UP RECOMMENDATIONS 02/04/2019 Follow up Recommendations Skilled Nursing facility   [facility] IP FREQUENCY AND DURATION 02/04/2019 Speech Therapy Frequency (ACUTE ONLY) min 2x/week Treatment Duration 2 weeks      CHL IP ORAL PHASE 02/04/2019 Oral Phase WFL Oral - Pudding Teaspoon -- Oral - Pudding Cup -- Oral - Honey Teaspoon -- Oral - Honey Cup WFL Oral - Nectar Teaspoon -- Oral - Nectar Cup WFL Oral - Nectar Straw -- Oral - Thin Teaspoon -- Oral - Thin Cup NT Oral - Thin Straw -- Oral - Puree NT Oral - Mech Soft -- Oral - Regular NT Oral - Multi-Consistency -- Oral - Pill -- Oral Phase - Comment --  CHL IP PHARYNGEAL PHASE 02/04/2019 Pharyngeal Phase Impaired Pharyngeal- Pudding Teaspoon -- Pharyngeal -- Pharyngeal- Pudding Cup -- Pharyngeal -- Pharyngeal- Honey Teaspoon NT Pharyngeal -- Pharyngeal-  Honey Cup Penetration/Aspiration during swallow;Pharyngeal residue - valleculae;Pharyngeal residue - pyriform;Reduced airway/laryngeal closure Pharyngeal Material enters airway, remains ABOVE vocal cords and not ejected out Pharyngeal- Nectar Teaspoon NT Pharyngeal -- Pharyngeal- Nectar Cup Penetration/Aspiration during swallow;Reduced airway/laryngeal closure;Pharyngeal residue - valleculae;Pharyngeal residue - pyriform Pharyngeal Material enters airway, passes BELOW cords and not ejected out despite cough attempt by patient;Material enters airway, CONTACTS cords and  not ejected out Pharyngeal- Nectar Straw -- Pharyngeal -- Pharyngeal- Thin Teaspoon NT Pharyngeal -- Pharyngeal- Thin Cup NT Pharyngeal -- Pharyngeal- Thin Straw -- Pharyngeal -- Pharyngeal- Puree NT Pharyngeal -- Pharyngeal- Mechanical Soft -- Pharyngeal -- Pharyngeal- Regular NT Pharyngeal -- Pharyngeal- Multi-consistency -- Pharyngeal -- Pharyngeal- Pill -- Pharyngeal -- Pharyngeal Comment --  CHL IP CERVICAL ESOPHAGEAL PHASE 02/04/2019 Cervical Esophageal Phase WFL Pudding Teaspoon -- Pudding Cup -- Honey Teaspoon -- Honey Cup -- Nectar Teaspoon -- Nectar Cup -- Nectar Straw -- Thin Teaspoon -- Thin Cup -- Thin Straw -- Puree -- Mechanical Soft -- Regular -- Multi-consistency -- Pill -- Cervical Esophageal Comment -- Royce Macadamia 02/04/2019, 10:23 AM Breck Coons [employer] Pager [phone] Office [phone]              Dg Swallowing Func-speech Pathology  Result Date: 01/30/2019 Objective Swallowing Evaluation: Type of Study: MBS-Modified Barium Swallow Study  Patient Details Name: Antonio [name]. MRN: [id number] Date of Birth: 1956/06/12 Today's Date: 01/30/2019 Time: SLP Start Time (ACUTE ONLY): 1328 -SLP Stop Time (ACUTE ONLY): 1348 SLP Time Calculation (min) (ACUTE ONLY): 20 min Past Medical History: Past Medical History: Diagnosis Date  Coronary artery disease   Diabetes mellitus without complication (HCC)   Hyperlipidemia   Hypertension   Osteomyelitis (HCC) 03/25/2017  RT FOOT Past Surgical History: Past Surgical History: Procedure Laterality Date  AMPUTATION Right 03/27/2017  Procedure: 1st Ray Amputation Right Foot;  Surgeon: Nadara Mustard, MD;  Location: [facility] OR;  Service: Orthopedics;  Laterality: Right;  APPENDECTOMY    BELOW KNEE LEG AMPUTATION Left   CARDIAC CATHETERIZATION    CORONARY STENT INTERVENTION  2005  Great toe amputation right.    I&D EXTREMITY Left 12/30/2015  Procedure: IRRIGATION AND DEBRIDEMENT LEFT FOOT, TRANSMETATARSAL AMPUTATION  WITH APPLICATION OF ANTIBIOTIC BEADS AND WOUND VAC;  Surgeon: Nadara Mustard, MD;  Location: [facility] OR;  Service: Orthopedics;  Laterality: Left;  IR GASTROSTOMY TUBE MOD SED  12/23/2018  TONSILLECTOMY    TRACHEOSTOMY  11/2018 HPI: 63 year old male coming in with fever cough chest pain and shortness of breath was found to have influenza A. PMH: diabetes mellitus, hypertension.  Intubated multiple times 1/3 to 1/16 with trach placed 1/16. Repeat MBS on 3/28 for possible initiation of po's.  Subjective: pt is alert and cooperative but needs encouragement Assessment / Plan / Recommendation CHL IP CLINICAL IMPRESSIONS 01/30/2019 Clinical Impression Pt's trach was capped this morning and during MBS with normal oropharyngeal pressures. Swallow function has improved from prior study despite silent aspiration with thin and penetration of nectar and honey. Laryngeal excursion, pharyngeal contraction and epiglottic inversion are not of normal strength and ROM however improved from last week. Incomplete closure led to penetration with thin, nectar and only flash with honey thick x 1. Chin tuck did not prevent penetration (silent) with nectar and vallecular and pyriform sinus residue present and was mildly increased with honey thick. Verbal cues to perform second swallow reduced volume. Puree was not penetrated this study. Recommend Dys 2 given mild oral residue, impulsivity and deconditioning. Honey thick liquids  recommended with two swallows, intermittent throat clears and pills whole in applesauce. ST will continue to follow for safety and upgrade of po textures. SLP Visit Diagnosis Dysphagia, oropharyngeal phase (R13.12) Attention and concentration deficit following -- Frontal lobe and executive function deficit following -- Impact on safety and function Moderate aspiration risk;Severe aspiration risk   CHL IP TREATMENT RECOMMENDATION 01/30/2019 Treatment Recommendations Therapy as outlined in treatment plan below   Prognosis  01/30/2019 Prognosis for Safe Diet Advancement Good Barriers to Reach Goals -- Barriers/Prognosis Comment -- CHL IP DIET RECOMMENDATION 01/30/2019 SLP Diet Recommendations Honey thick liquids;Dysphagia 2 (Fine chop) solids Liquid Administration via Cup;No straw Medication Administration Whole meds with puree Compensations Slow rate;Small sips/bites;Multiple dry swallows after each bite/sip;Clear throat intermittently Postural Changes Seated upright at 90 degrees   CHL IP OTHER RECOMMENDATIONS 01/30/2019 Recommended Consults -- Oral Care Recommendations Oral care BID Other Recommendations --   CHL IP FOLLOW UP RECOMMENDATIONS 01/30/2019 Follow up Recommendations (No Data)   CHL IP FREQUENCY AND DURATION 01/30/2019 Speech Therapy Frequency (ACUTE ONLY) min 2x/week Treatment Duration 2 weeks      CHL IP ORAL PHASE 01/30/2019 Oral Phase Impaired Oral - Pudding Teaspoon -- Oral - Pudding Cup -- Oral - Honey Teaspoon -- Oral - Honey Cup Lingual/palatal residue Oral - Nectar Teaspoon -- Oral - Nectar Cup Lingual/palatal residue Oral - Nectar Straw -- Oral - Thin Teaspoon -- Oral - Thin Cup -- Oral - Thin Straw -- Oral - Puree Lingual/palatal residue Oral - Mech Soft -- Oral - Regular WFL Oral - Multi-Consistency -- Oral - Pill -- Oral Phase - Comment --  CHL IP PHARYNGEAL PHASE 01/30/2019 Pharyngeal Phase Impaired Pharyngeal- Pudding Teaspoon -- Pharyngeal -- Pharyngeal- Pudding Cup -- Pharyngeal -- Pharyngeal- Honey Teaspoon NT Pharyngeal -- Pharyngeal- Honey Cup Pharyngeal residue - valleculae;Pharyngeal residue - pyriform;Reduced epiglottic inversion;Penetration/Aspiration during swallow;Reduced airway/laryngeal closure Pharyngeal Material enters airway, remains ABOVE vocal cords then ejected out Pharyngeal- Nectar Teaspoon NT Pharyngeal -- Pharyngeal- Nectar Cup Penetration/Aspiration during swallow;Reduced airway/laryngeal closure;Reduced epiglottic inversion;Pharyngeal residue - valleculae;Pharyngeal residue -  pyriform;Compensatory strategies attempted (with notebox) Pharyngeal Material enters airway, remains ABOVE vocal cords and not ejected out Pharyngeal- Nectar Straw -- Pharyngeal -- Pharyngeal- Thin Teaspoon NT Pharyngeal -- Pharyngeal- Thin Cup Penetration/Aspiration during swallow;Pharyngeal residue - valleculae;Pharyngeal residue - pyriform;Moderate aspiration;Trace aspiration;Reduced airway/laryngeal closure;Reduced epiglottic inversion Pharyngeal Material enters airway, passes BELOW cords without attempt by patient to eject out (silent aspiration) Pharyngeal- Thin Straw -- Pharyngeal -- Pharyngeal- Puree Pharyngeal residue - pyriform;Pharyngeal residue - valleculae;Reduced epiglottic inversion Pharyngeal Material does not enter airway Pharyngeal- Mechanical Soft -- Pharyngeal -- Pharyngeal- Regular WFL Pharyngeal -- Pharyngeal- Multi-consistency -- Pharyngeal -- Pharyngeal- Pill -- Pharyngeal -- Pharyngeal Comment --  CHL IP CERVICAL ESOPHAGEAL PHASE 01/30/2019 Cervical Esophageal Phase WFL Pudding Teaspoon -- Pudding Cup -- Honey Teaspoon -- Honey Cup -- Nectar Teaspoon -- Nectar Cup -- Nectar Straw -- Thin Teaspoon -- Thin Cup -- Thin Straw -- Puree -- Mechanical Soft -- Regular -- Multi-consistency -- Pill -- Cervical Esophageal Comment -- Royce Macadamia 01/30/2019, 2:53 PM Breck Coons Litaker M.Ed Sports administrator Pager [phone] Office [phone]              Dg Swallowing Func-speech Pathology  Result Date: 01/23/2019 Objective Swallowing Evaluation: Type of Study: MBS-Modified Barium Swallow Study  Patient Details Name: Antonio [name]. MRN: [id number] Date of Birth: [birth year] Today's Date: 01/23/2019 Time: SLP Start Time (ACUTE ONLY): 1254 -SLP Stop Time (ACUTE ONLY): 1308 SLP Time Calculation (  min) (ACUTE ONLY): 14 min Past Medical History: Past Medical History: Diagnosis Date  Coronary artery disease   Diabetes mellitus without complication (HCC)   Hyperlipidemia    Hypertension   Osteomyelitis (HCC) 03/25/2017  RT FOOT Past Surgical History: Past Surgical History: Procedure Laterality Date  AMPUTATION Right 03/27/2017  Procedure: 1st Ray Amputation Right Foot;  Surgeon: Nadara MustardMarcus Duda V, MD;  Location: [facility] OR;  Service: Orthopedics;  Laterality: Right;  APPENDECTOMY    BELOW KNEE LEG AMPUTATION Left   CARDIAC CATHETERIZATION    CORONARY STENT INTERVENTION  2005  Great toe amputation right.    I&D EXTREMITY Left 12/30/2015  Procedure: IRRIGATION AND DEBRIDEMENT LEFT FOOT, TRANSMETATARSAL AMPUTATION WITH APPLICATION OF ANTIBIOTIC BEADS AND WOUND VAC;  Surgeon: Nadara MustardMarcus Duda V, MD;  Location: [facility] OR;  Service: Orthopedics;  Laterality: Left;  IR GASTROSTOMY TUBE MOD SED  12/23/2018  TONSILLECTOMY    TRACHEOSTOMY  11/2018 HPI: 63 year old male coming in with fever cough chest pain and shortness of breath was found to have influenza A. PMH: diabetes mellitus, hypertension.  Intubated multiple times 1/3 to 1/16 with trach placed 1/16.   Subjective: pt is alert and cooperative but needs encouragement Assessment / Plan / Recommendation CHL IP CLINICAL IMPRESSIONS 01/23/2019 Clinical Impression Pt has a moderate pharyngeal dysphagia due to reduced strength and airway closure s/p prolonged (and repeated) intubations. His timing for swallow trigger is good, but he has reduced hyolaryngeal movement, pharyngeal squeeze, epiglottic inversion, and tonge base retraction. Thin and nectar thick liquids enter the laryngeal vestibule during the swallow because of his incomplete closure, with penetration intermittently reaching the true vocal folds without spontaneous cough elicited. Multiple cued coughs were required to eject penetrates. Honey thick liquids and purees left more residue in the valleculae and pyriform sinuses, with intermittent penetration after the swallow from the residual material. Recommend he remain NPO except for initiating small amounts of purees and spoonfuls of thickened  liquids during SLP visits only to facilitate restrengthening. Pt may also benefit from EMST and other exercises to progress strength of cough and pharyngeal musculature.   SLP Visit Diagnosis Dysphagia, pharyngeal phase (R13.13) Attention and concentration deficit following -- Frontal lobe and executive function deficit following -- Impact on safety and function Moderate aspiration risk   CHL IP TREATMENT RECOMMENDATION 01/23/2019 Treatment Recommendations Therapy as outlined in treatment plan below   Prognosis 01/23/2019 Prognosis for Safe Diet Advancement Good Barriers to Reach Goals -- Barriers/Prognosis Comment -- CHL IP DIET RECOMMENDATION 01/23/2019 SLP Diet Recommendations NPO Liquid Administration via -- Medication Administration Via alternative means Compensations -- Postural Changes --   CHL IP OTHER RECOMMENDATIONS 01/23/2019 Recommended Consults -- Oral Care Recommendations Oral care QID Other Recommendations Have oral suction available   CHL IP FOLLOW UP RECOMMENDATIONS 01/23/2019 Follow up Recommendations Inpatient Rehab   CHL IP FREQUENCY AND DURATION 01/23/2019 Speech Therapy Frequency (ACUTE ONLY) min 2x/week Treatment Duration 2 weeks      CHL IP ORAL PHASE 01/23/2019 Oral Phase WFL Oral - Pudding Teaspoon -- Oral - Pudding Cup -- Oral - Honey Teaspoon -- Oral - Honey Cup -- Oral - Nectar Teaspoon -- Oral - Nectar Cup -- Oral - Nectar Straw -- Oral - Thin Teaspoon -- Oral - Thin Cup -- Oral - Thin Straw -- Oral - Puree -- Oral - Mech Soft -- Oral - Regular -- Oral - Multi-Consistency -- Oral - Pill -- Oral Phase - Comment --  CHL IP PHARYNGEAL PHASE 01/23/2019 Pharyngeal Phase Impaired Pharyngeal- Pudding Teaspoon --  Pharyngeal -- Pharyngeal- Pudding Cup -- Pharyngeal -- Pharyngeal- Honey Teaspoon Reduced pharyngeal peristalsis;Reduced epiglottic inversion;Reduced anterior laryngeal mobility;Reduced laryngeal elevation;Reduced airway/laryngeal closure;Reduced tongue base retraction;Pharyngeal residue -  valleculae;Pharyngeal residue - pyriform Pharyngeal -- Pharyngeal- Honey Cup -- Pharyngeal -- Pharyngeal- Nectar Teaspoon Reduced pharyngeal peristalsis;Reduced epiglottic inversion;Reduced anterior laryngeal mobility;Reduced laryngeal elevation;Reduced airway/laryngeal closure;Reduced tongue base retraction;Pharyngeal residue - valleculae;Pharyngeal residue - pyriform;Penetration/Aspiration during swallow Pharyngeal Material enters airway, remains ABOVE vocal cords and not ejected out Pharyngeal- Nectar Cup Reduced pharyngeal peristalsis;Reduced epiglottic inversion;Reduced anterior laryngeal mobility;Reduced laryngeal elevation;Reduced airway/laryngeal closure;Reduced tongue base retraction;Pharyngeal residue - valleculae;Pharyngeal residue - pyriform;Penetration/Aspiration during swallow Pharyngeal Material enters airway, remains ABOVE vocal cords and not ejected out Pharyngeal- Nectar Straw -- Pharyngeal -- Pharyngeal- Thin Teaspoon Reduced pharyngeal peristalsis;Reduced epiglottic inversion;Reduced anterior laryngeal mobility;Reduced laryngeal elevation;Reduced airway/laryngeal closure;Reduced tongue base retraction;Penetration/Aspiration during swallow Pharyngeal Material enters airway, CONTACTS cords and not ejected out Pharyngeal- Thin Cup -- Pharyngeal -- Pharyngeal- Thin Straw -- Pharyngeal -- Pharyngeal- Puree Reduced pharyngeal peristalsis;Reduced epiglottic inversion;Reduced anterior laryngeal mobility;Reduced laryngeal elevation;Reduced airway/laryngeal closure;Reduced tongue base retraction;Pharyngeal residue - valleculae;Pharyngeal residue - pyriform;Penetration/Apiration after swallow Pharyngeal Material enters airway, remains ABOVE vocal cords and not ejected out Pharyngeal- Mechanical Soft -- Pharyngeal -- Pharyngeal- Regular -- Pharyngeal -- Pharyngeal- Multi-consistency -- Pharyngeal -- Pharyngeal- Pill -- Pharyngeal -- Pharyngeal Comment --  CHL IP CERVICAL ESOPHAGEAL PHASE 01/23/2019 Cervical  Esophageal Phase WFL Pudding Teaspoon -- Pudding Cup -- Honey Teaspoon -- Honey Cup -- Nectar Teaspoon -- Nectar Cup -- Nectar Straw -- Thin Teaspoon -- Thin Cup -- Thin Straw -- Puree -- Mechanical Soft -- Regular -- Multi-consistency -- Pill -- Cervical Esophageal Comment -- Virl Axe Nix 01/23/2019, 2:32 PM  [name], M.A. CCC-SLP Acute Rehabilitation Services Pager [phone] Office [phone]               LOS: 80 days   Jeoffrey Massed, MD  [facility]  If 7PM-7AM, please contact night-coverage  Please page via [email or web address]  Go to [email or web address] and use [facility]'s universal password to access. If you do not have the password, please contact the hospital operator.  Locate the [facility] provider you are looking for under [facility] and page to a number that you can be directly reached. If you still have difficulty reaching the provider, please page the [facility] (Director on Call) for the Hospitalists listed on amion for assistance.  02/16/2019, 11:28 AM

## 2019-02-16 NOTE — Progress Notes (Signed)
Physical Therapy Wound Treatment Patient Details  Name: Antonio [name]. MRN: [id number] Date of Birth: 1956/03/27  Today's Date: 02/16/2019 Time: 6394-3200 Time Calculation (min): 31 min  Subjective  Subjective: Pt asking about wound care after dc Patient and Family Stated Goals: "go home." Prior Treatments: dressing changes  Pain Score:  Pt premedicated. Pain 6/10 faces  Wound Assessment  Pressure Injury 12/01/18 Unstageable - Full thickness tissue loss in which the base of the ulcer is covered by slough (yellow, tan, gray, green or brown) and/or eschar (tan, brown or black) in the wound bed. sacrum (Active)  Dressing Type ABD;Gauze (Comment);Moist to dry;Barrier Film (skin prep) 02/16/2019 11:03 AM  Dressing Clean;Dry;Intact;Changed 02/16/2019 11:03 AM  Dressing Change Frequency Other (Comment) 02/16/2019 11:03 AM  State of Healing Early/partial granulation 02/13/2019 10:51 AM  Site / Wound Assessment Red;Yellow;Pink 02/16/2019 11:03 AM  % Wound base Red or Granulating 55% 02/16/2019 11:03 AM  % Wound base Yellow/Fibrinous Exudate 45% 02/16/2019 11:03 AM  % Wound base Black/Eschar 0% 02/16/2019 11:03 AM  % Wound base Other/Granulation Tissue (Comment) 0% 02/16/2019 11:03 AM  Peri-wound Assessment Erythema (blanchable);Pink 02/16/2019 11:03 AM  Wound Length (cm) 7.5 cm 02/14/2019 10:00 AM  Wound Width (cm) 3 cm 02/14/2019 10:00 AM  Wound Depth (cm) 2 cm 02/14/2019 10:00 AM  Wound Surface Area (cm^2) 22.5 cm^2 02/14/2019 10:00 AM  Wound Volume (cm^3) 45 cm^3 02/14/2019 10:00 AM  Tunneling (cm) 4.5 02/10/2019 11:27 AM  Undermining (cm) 7.5 02/14/2019 10:00 AM  Margins Unattached edges (unapproximated) 02/16/2019 11:03 AM  Drainage Amount Moderate 02/16/2019 11:03 AM  Drainage Description Purulent;Green;Odor 02/16/2019 11:03 AM  Treatment Debridement (Selective);Hydrotherapy (Pulse lavage);Packing (Saline gauze) 02/16/2019 11:03 AM   Santyl applied to wound bed prior to applying dressing.     Hydrotherapy Pulsed lavage therapy - wound location: sacrum Pulsed Lavage with Suction (psi): 12 psi Pulsed Lavage with Suction - Normal Saline Used: 1000 mL Pulsed Lavage Tip: Tip with splash shield Selective Debridement Selective Debridement - Location: sacrum Selective Debridement - Tools Used: Forceps;Scissors Selective Debridement - Tissue Removed: necrotic tissue   Wound Assessment and Plan  Wound Therapy - Assess/Plan/Recommendations Wound Therapy - Clinical Statement: Pt continues to have purulent drainage. Pt also with red periwound indicative of yeast. Surgery present. Will continue santyl and moist to dry with microguard powder for periwound. Surgery to incr dressing changes to 3x/day. Wound Therapy - Functional Problem List: decr mobility Factors Delaying/Impairing Wound Healing: Diabetes Mellitus;Immobility;Multiple medical problems Hydrotherapy Plan: Debridement;Dressing change;Patient/family education;Pulsatile lavage with suction Wound Therapy - Frequency: 6X / week Wound Therapy - Follow Up Recommendations: Skilled nursing facility Wound Plan: see above  Wound Therapy Goals- Improve the function of patient's integumentary system by progressing the wound(s) through the phases of wound healing (inflammation - proliferation - remodeling) by: Decrease Necrotic Tissue to: 40 Decrease Necrotic Tissue - Progress: Progressing toward goal Increase Granulation Tissue to: 60 Increase Granulation Tissue - Progress: Progressing toward goal  Goals will be updated until maximal potential achieved or discharge criteria met.  Discharge criteria: when goals achieved, discharge from hospital, MD decision/surgical intervention, no progress towards goals, refusal/missing three consecutive treatments without notification or medical reason.  [name] 02/16/2019, 11:07 AM [place] Pager [phone] Office [phone]

## 2019-02-16 NOTE — Progress Notes (Signed)
Subjective: CC: Sacral pain Undergoing hydrotherapy  Objective: Vital signs in last 24 hours: Temp:  [98.1 F (36.7 C)-98.9 F (37.2 C)] 98.1 F (36.7 C) (03/23 0529) Pulse Rate:  [83-87] 83 (03/23 0529) BP: (132-160)/(69-82) 132/69 (03/23 0529) SpO2:  [90 %-95 %] 93 % (03/23 0900) Last BM Date: 02/11/19  Intake/Output from previous day: 03/22 0701 - 03/23 0700 In: 720 [P.O.:720] Out: 550 [Urine:550] Intake/Output this shift: No intake/output data recorded.  PE: Gen: Alert, NAD, pleasant HEENT: EOM's intact, pupils equal and round Pulm:effort normal GU: See picture below. Ssacral wound with significant amount of slough, wound tunnels about 4cm cephalad and laterally left. 2cm opening. Wound edges appear cleaner and necrosis has resolved. There is a significant amount of erythema as seen in the picture below of the peri-wound.       Lab Results:  Recent Labs    02/15/19 0455  WBC 10.1  HGB 11.3*  HCT 36.2*  PLT 264   BMET Recent Labs    02/15/19 0455  NA 136  K 4.0  CL 97*  CO2 30  GLUCOSE 130*  BUN 25*  CREATININE 0.59*  CALCIUM 9.1   PT/INR No results for input(s): LABPROT, INR in the last 72 hours. CMP     Component Value Date/Time   NA 136 02/15/2019 0455   K 4.0 02/15/2019 0455   CL 97 (L) 02/15/2019 0455   CO2 30 02/15/2019 0455   GLUCOSE 130 (H) 02/15/2019 0455   BUN 25 (H) 02/15/2019 0455   CREATININE 0.59 (L) 02/15/2019 0455   CALCIUM 9.1 02/15/2019 0455   PROT 5.6 (L) 02/09/2019 0502   ALBUMIN 3.0 (L) 02/09/2019 0502   AST 19 02/09/2019 0502   ALT 8 02/09/2019 0502   ALKPHOS 108 02/09/2019 0502   BILITOT 1.7 (H) 02/09/2019 0502   GFRNONAA >60 02/15/2019 0455   GFRAA >60 02/15/2019 0455   Lipase     Component Value Date/Time   LIPASE 24 11/30/2018 0244       Studies/Results: No results found.  Anti-infectives: Anti-infectives (From admission, onward)   Start     Dose/Rate Route Frequency Ordered Stop   02/04/19 1300  vancomycin (VANCOCIN) 1,250 mg in sodium chloride 0.9 % 250 mL IVPB  Status:  Discontinued     1,250 mg 166.7 mL/hr over 90 Minutes Intravenous Every 12 hours 02/04/19 1200 02/06/19 1002   02/04/19 1200  metroNIDAZOLE (FLAGYL) IVPB 500 mg  Status:  Discontinued     500 mg 100 mL/hr over 60 Minutes Intravenous Every 8 hours 02/04/19 1146 02/06/19 1002   02/04/19 0930  ceFEPIme (MAXIPIME) 2 g in sodium chloride 0.9 % 100 mL IVPB     2 g 200 mL/hr over 30 Minutes Intravenous Every 8 hours 02/04/19 0926 02/08/19 2359   02/03/19 1200  meropenem (MERREM) 1 g in sodium chloride 0.9 % 100 mL IVPB  Status:  Discontinued     1 g 200 mL/hr over 30 Minutes Intravenous Every 8 hours 02/03/19 1054 02/04/19 0924   01/19/19 1400  cefTAZidime (FORTAZ) 2 g in sodium chloride 0.9 % 100 mL IVPB     2 g 200 mL/hr over 30 Minutes Intravenous Every 8 hours 01/19/19 1120 01/24/19 2359   01/15/19 1500  cefTAZidime (FORTAZ) 2 g in sodium chloride 0.9 % 100 mL IVPB     2 g 200 mL/hr over 30 Minutes Intravenous Every 8 hours 01/15/19 1448 01/19/19 0542   01/14/19 1400  colistimethate ((  COLYMYCIN-M)) nebulized solution 150 mg  Status:  Discontinued     150 mg Inhalation 3 times daily 01/14/19 1142 01/19/19 1119   01/14/19 1300  meropenem (MERREM) 1 g in sodium chloride 0.9 % 100 mL IVPB  Status:  Discontinued     1 g 200 mL/hr over 30 Minutes Intravenous Every 8 hours 01/14/19 1235 01/15/19 1448   12/23/18 1506  vancomycin (VANCOCIN) 1-5 GM/200ML-% IVPB    Note to Pharmacy:  [facility]   : cabinet override      12/23/18 1506 12/23/18 1515   12/20/18 2200  vancomycin (VANCOCIN) IVPB 1000 mg/200 mL premix  Status:  Discontinued     1,000 mg 200 mL/hr over 60 Minutes Intravenous Every 12 hours 12/20/18 1804 12/22/18 1413   12/18/18 0800  ceFEPIme (MAXIPIME) 1 g in sodium chloride 0.9 % 100 mL IVPB  Status:  Discontinued     1 g 200 mL/hr over 30 Minutes Intravenous Every 8 hours 12/18/18 0713  12/19/18 1201   12/17/18 0530  vancomycin (VANCOCIN) 1,500 mg in sodium chloride 0.9 % 500 mL IVPB  Status:  Discontinued     1,500 mg 250 mL/hr over 120 Minutes Intravenous Every 12 hours 12/16/18 1704 12/20/18 1804   12/16/18 1730  cefTAZidime (FORTAZ) 2 g in sodium chloride 0.9 % 100 mL IVPB  Status:  Discontinued     2 g 200 mL/hr over 30 Minutes Intravenous Every 8 hours 12/16/18 1704 12/18/18 0702   12/16/18 1730  vancomycin (VANCOCIN) 2,000 mg in sodium chloride 0.9 % 500 mL IVPB     2,000 mg 250 mL/hr over 120 Minutes Intravenous  Once 12/16/18 1704 12/16/18 2030   12/07/18 1300  erythromycin 500 mg in sodium chloride 0.9 % 100 mL IVPB  Status:  Discontinued     500 mg 100 mL/hr over 60 Minutes Intravenous Every 6 hours 12/07/18 1118 12/16/18 1627   12/06/18 1500  vancomycin (VANCOCIN) 1,250 mg in sodium chloride 0.9 % 250 mL IVPB  Status:  Discontinued     1,250 mg 166.7 mL/hr over 90 Minutes Intravenous Every 12 hours 12/06/18 1436 12/07/18 1118   12/06/18 0100  vancomycin (VANCOCIN) 1,250 mg in sodium chloride 0.9 % 250 mL IVPB     1,250 mg 166.7 mL/hr over 90 Minutes Intravenous  Once 12/05/18 1054 12/06/18 0146   12/05/18 1200  vancomycin (VANCOCIN) 2,000 mg in sodium chloride 0.9 % 500 mL IVPB     2,000 mg 250 mL/hr over 120 Minutes Intravenous  Once 12/05/18 1054 12/05/18 1350   12/05/18 1200  cefTAZidime (FORTAZ) 1 g in sodium chloride 0.9 % 100 mL IVPB  Status:  Discontinued     1 g 200 mL/hr over 30 Minutes Intravenous Every 8 hours 12/05/18 1106 12/07/18 1118   11/30/18 2200  oseltamivir (TAMIFLU) 6 MG/ML suspension 30 mg     30 mg Per Tube 2 times daily 11/30/18 1348 12/03/18 2159   11/30/18 2200  ceFEPIme (MAXIPIME) 1 g in sodium chloride 0.9 % 100 mL IVPB  Status:  Discontinued     1 g 200 mL/hr over 30 Minutes Intravenous Every 12 hours 11/30/18 1350 12/01/18 1039   11/29/18 2200  ceFEPIme (MAXIPIME) 2 g in sodium chloride 0.9 % 100 mL IVPB  Status:  Discontinued      2 g 200 mL/hr over 30 Minutes Intravenous Every 12 hours 11/29/18 1645 11/30/18 1350   11/29/18 1100  oseltamivir (TAMIFLU) 6 MG/ML suspension 75 mg  Status:  Discontinued  75 mg Per Tube 2 times daily 11/29/18 1000 11/30/18 1348   11/28/18 2300  oseltamivir (TAMIFLU) capsule 75 mg  Status:  Discontinued     75 mg Oral 2 times daily 11/28/18 2238 11/29/18 1000   11/28/18 2030  oseltamivir (TAMIFLU) capsule 75 mg     75 mg Oral  Once 11/28/18 2029 11/28/18 2207   11/28/18 1745  vancomycin (VANCOCIN) 2,000 mg in sodium chloride 0.9 % 500 mL IVPB     2,000 mg 250 mL/hr over 120 Minutes Intravenous  Once 11/28/18 1741 11/28/18 2203   11/28/18 1745  piperacillin-tazobactam (ZOSYN) IVPB 3.375 g     3.375 g 100 mL/hr over 30 Minutes Intravenous  Once 11/28/18 1741 11/28/18 1834       Assessment/Plan HTN CHF DM S/p tx for Influenza A S/p tracheostomy  S/p tx for HCAP Deconditioning  Sacral Wound - MRI negative for osteomyelitisof areas evaluated -hydrotherapy,[place] WTD dressing changes TID. Microguard powder around peri-wound. D/c dakin's - Turn patient q2hrs  FEN -TF's, carb modified VTE -Lovenox ID -not currently on any abx  Plan:Continue hydrotherapy and wet to dry/santyl dressing changes while inpatient. Will increase to TID. D.c dakin's. Would benefit from Microguurd around the peri-wound. This does not appear to be causing him to become systemically ill at this time. No indication for surgical debridement. He can be discharged from surgical standpoint.     LOS: 80 days    Antonio [name] , [facility] 02/16/2019, 11:41 AM Pager: [phone]

## 2019-02-16 NOTE — Progress Notes (Signed)
CSW consulted with patient's son Antonio [name] regarding bank statement required for SNF placement. Antonio [name] reports he was able to speak to the bank with his dad, and since Antonio [name] is not POA they are having to mail the bank statement to him. Antonio [name] reports he will notify CSW as soon as bank statement is received in the mail.   CSW inquired about Antonio [name] completing POA paperwork and he reports he has not had time.   [place], [place] [phone]

## 2019-02-16 NOTE — Progress Notes (Addendum)
Occupational Therapy Treatment Patient Details Name: Antonio [name]. MRN: [id number] DOB: Jul 29, 1956 Today's Date: 02/16/2019    History of present illness Pt is a 64 y.o. male admitted 11/28/18 with flu A and PNA; acute respiratory failure intubated 1/4. Trach placed 1/16. PEG placed 1/28. Hospital course complicared by acute MI, CHF, cardiogenic shock, and mucous plugging. Also with sacral wound.  De-cannulated 02/02/19. PMH includes DM, HTN, HLD, CAD, L BKA, R transmet amputation.   OT comments  Focus of session on R scapular mobilization to increase functional ROM of shoulder as noted below. Pt with apparent abnormal  scapulohumeral rhythm with capsular tightness. Will continue to follow acutely.   Follow Up Recommendations  SNF;Supervision - Intermittent    Equipment Recommendations  Other (comment)(TBA)    Recommendations for Other Services      Precautions / Restrictions Precautions Precautions: Fall Precaution Comments: PEG, monitor O2 sats Restrictions Weight Bearing Restrictions: No       Mobility Bed Mobility Overal bed mobility: Modified Independent             General bed mobility comments: heavy use of rails  Transfers                      Balance                                           ADL either performed or assessed with clinical judgement   ADL                                         General ADL Comments: Pt states he is using his R UE to help feed himself.      Vision       Perception     Praxis      Cognition Arousal/Alertness: Awake/alert Behavior During Therapy: WFL for tasks assessed/performed Overall Cognitive Status: Within Functional Limits for tasks assessed                                          Exercises General Exercises - Upper Extremity Shoulder Flexion: AAROM;Right;15 reps;Supine(L shoulder strengthening) Shoulder ABduction: AAROM;Right;15  reps Other Exercises Other Exercises: R scapular mobility followed by A/AAROM in PNF patterns; worked in L sidelying with protraction and increasing inferior glide and downward rotation    Shoulder Instructions       General Comments      Pertinent Vitals/ Pain       Pain Assessment: Faces Faces Pain Scale: Hurts even more Pain Location: with RUE shoulder ROM Pain Descriptors / Indicators: Discomfort;Grimacing Pain Intervention(s): Limited activity within patient's tolerance;Repositioned  Home Living                                          Prior Functioning/Environment              Frequency  Min 2X/week        Progress Toward Goals  OT Goals(current goals can now be found in the care plan section)  Progress towards OT goals: Progressing toward goals  Acute Rehab OT Goals Patient Stated Goal: to use his R arm better OT Goal Formulation: With patient Time For Goal Achievement: 02/25/19 Potential to Achieve Goals: Good ADL Goals Pt Will Perform Grooming: standing;with supervision Pt Will Perform Upper Body Dressing: with set-up;sitting Pt Will Transfer to Toilet: with supervision;ambulating;bedside commode Pt/caregiver will Perform Home Exercise Program: Increased ROM;Increased strength;Right Upper extremity;With minimal assist Additional ADL Goal #1: Pt will perform bed mobility modified independently from flat bed without rails. Additional ADL Goal #2: Pt will gather items necessary for ADL around his room with RW and min assist. Additional ADL Goal #3: Pt will follow 50% on cues in quiet environement during ADL  Plan Discharge plan remains appropriate    Co-evaluation                 AM-PAC OT "6 Clicks" Daily Activity     Outcome Measure   Help from another person eating meals?: None Help from another person taking care of personal grooming?: None Help from another person toileting, which includes using toliet, bedpan, or  urinal?: A Lot Help from another person bathing (including washing, rinsing, drying)?: A Lot Help from another person to put on and taking off regular upper body clothing?: A Little Help from another person to put on and taking off regular lower body clothing?: A Lot 6 Click Score: 17    End of Session Equipment Utilized During Treatment: Oxygen  OT Visit Diagnosis: Unsteadiness on feet (R26.81);Other abnormalities of gait and mobility (R26.89);Muscle weakness (generalized) (M62.81);Other symptoms and signs involving cognitive function   Activity Tolerance Patient tolerated treatment well   Patient Left in bed;with call bell/phone within reach;with bed alarm set   Nurse Communication Mobility status        Time: 1638-4536 OT Time Calculation (min): 24 min  Charges: OT General Charges $OT Visit: 1 Visit OT Treatments $Therapeutic Exercise: 23-37 mins  Luisa Dago, OT/L   Acute OT Clinical Specialist Acute Rehabilitation Services Pager [phone] Office [phone]    [facility] 02/16/2019, 12:32 PM

## 2019-02-17 LAB — GLUCOSE, CAPILLARY
GLUCOSE-CAPILLARY: 152 mg/dL — AB (ref 70–99)
GLUCOSE-CAPILLARY: 122 mg/dL — AB (ref 70–99)
Glucose-Capillary: 120 mg/dL — ABNORMAL HIGH (ref 70–99)
Glucose-Capillary: 124 mg/dL — ABNORMAL HIGH (ref 70–99)

## 2019-02-17 NOTE — Progress Notes (Addendum)
PROGRESS NOTE        PATIENT DETAILS Name: Antonio RichardsDaniel Watson Stringfield Jr. Age: 63 y.o. Sex: male Date of Birth: June 17, 1956 Admit Date: 11/28/2018 Admitting Physician Reyes IvanWael Z Aljishi, MD WUJ:WJXBPCP:York, Orie Routegina F, NP  Brief Narrative: Patient is a 63 y.o. male admitted on 1/3 with influenza A causing acute hypoxic respiratory failure, cardiogenic shock-required prolonged mechanical ventilation-is currently s/p tracheostomy and PEG tube placement.  Significant events: 1/03  Admit, Influenza A positive 1/06  Self extubated  1/07  Re-intubated  1/08  Self-extubated  1/16 tracheostomy 1/21  Trach revision to XLT distal 1/28 PEG tube 2/03  Weaned 15 minutes  2/11  ATC  2/14  Trach changed to #4, mucus plugging >back to #5 XLT 2/15  Failed ATC with increased WOB 2/16  Failed ATC with increased WOB  2/21 back on pressure support 2/24 back on TC  01/27/19 - 5 XLT cuffless trach  01/29/2019: Size 4 cuff less tracheostomy placed 01/30/2019: Capping trials initiated 02/02/2019: Tracheostomy decannulated  Subjective: No major issues overnight-no chest pain or shortness of breath.  Lying comfortably in bed.  Awaiting SNF placement  Assessment/Plan: Fever: Afebrile over the past few days-chest x-ray without any infiltrate, blood culture and urine culture on 3/11 negative.  Currently off all antimicrobial therapy-continue to monitor.  Stage IV sacral/coccygeal ulceration: Continue with hydrotherapy while inpatient-general surgery and wound care following.  Recent MRI negative for osteomyelitis.  Defer further to general surgery.  Acute hypoxic respiratory failure secondary to influenza A, pseudomonal/Corynebacterium pneumonia: Prolonged hospitalization requiring prolonged ventilator support-subsequently tracheostomy on 1/16.  Currently decannulated on 3/9-Per last PCCM note-if the wound does not close in 2 weeks-may need evaluation for tracheocutaneous fistula.  Acute systolic heart  failure (EF 20-25% on 1/4, EF 60-65% by TTE on 1/29): Compensated-continue beta-blocker.  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  Cardiology recommending further work-up to be done in the outpatient setting.  Dysphagia: Speech therapy following-recommendations were for a dysphagia 2 diet-however patient noncompliant-and as a result SLP has upgraded to a regular diet.  No signs of aspiration at this point.  Metabolic encephalopathy: Resolved.  Etiology felt to be secondary to hypoxia/pneumonia.  DM-2: CBG stable continue Levemir 25 units twice daily and SSI.  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  Chronic pain syndrome: Continue Nucynta and as needed oxycodone  Debility/deconditioning: Secondary to acute illness-awaiting SNF bed  DVT Prophylaxis: Prophylactic Lovenox   Code Status: Full code  Family Communication: None at bedside  Disposition Plan: Remain inpatient-SNF on discharge  Antimicrobial agents: Anti-infectives (From admission, onward)   Start     Dose/Rate Route Frequency Ordered Stop   02/04/19 1300  vancomycin (VANCOCIN) 1,250 mg in sodium chloride 0.9 % 250 mL IVPB  Status:  Discontinued     1,250 mg 166.7 mL/hr over 90 Minutes Intravenous Every 12 hours 02/04/19 1200 02/06/19 1002   02/04/19 1200  metroNIDAZOLE (FLAGYL) IVPB 500 mg  Status:  Discontinued     500 mg 100 mL/hr over 60 Minutes Intravenous Every 8 hours 02/04/19 1146 02/06/19 1002   02/04/19 0930  ceFEPIme (MAXIPIME) 2 g in sodium chloride 0.9 % 100 mL IVPB     2 g 200 mL/hr over 30 Minutes Intravenous Every 8 hours 02/04/19 0926 02/08/19 2359   02/03/19 1200  meropenem (MERREM) 1 g in sodium  chloride 0.9 % 100 mL IVPB  Status:  Discontinued     1 g 200 mL/hr over 30 Minutes Intravenous Every 8 hours 02/03/19 1054 02/04/19 0924   01/19/19 1400  cefTAZidime (FORTAZ) 2 g in sodium chloride 0.9 % 100 mL IVPB     2 g 200 mL/hr over 30 Minutes  Intravenous Every 8 hours 01/19/19 1120 01/24/19 2359   01/15/19 1500  cefTAZidime (FORTAZ) 2 g in sodium chloride 0.9 % 100 mL IVPB     2 g 200 mL/hr over 30 Minutes Intravenous Every 8 hours 01/15/19 1448 01/19/19 0542   01/14/19 1400  colistimethate ((COLYMYCIN-M)) nebulized solution 150 mg  Status:  Discontinued     150 mg Inhalation 3 times daily 01/14/19 1142 01/19/19 1119   01/14/19 1300  meropenem (MERREM) 1 g in sodium chloride 0.9 % 100 mL IVPB  Status:  Discontinued     1 g 200 mL/hr over 30 Minutes Intravenous Every 8 hours 01/14/19 1235 01/15/19 1448   12/23/18 1506  vancomycin (VANCOCIN) 1-5 GM/200ML-% IVPB    Note to Pharmacy:  [facility]   : cabinet override      12/23/18 1506 12/23/18 1515   12/20/18 2200  vancomycin (VANCOCIN) IVPB 1000 mg/200 mL premix  Status:  Discontinued     1,000 mg 200 mL/hr over 60 Minutes Intravenous Every 12 hours 12/20/18 1804 12/22/18 1413   12/18/18 0800  ceFEPIme (MAXIPIME) 1 g in sodium chloride 0.9 % 100 mL IVPB  Status:  Discontinued     1 g 200 mL/hr over 30 Minutes Intravenous Every 8 hours 12/18/18 0713 12/19/18 1201   12/17/18 0530  vancomycin (VANCOCIN) 1,500 mg in sodium chloride 0.9 % 500 mL IVPB  Status:  Discontinued     1,500 mg 250 mL/hr over 120 Minutes Intravenous Every 12 hours 12/16/18 1704 12/20/18 1804   12/16/18 1730  cefTAZidime (FORTAZ) 2 g in sodium chloride 0.9 % 100 mL IVPB  Status:  Discontinued     2 g 200 mL/hr over 30 Minutes Intravenous Every 8 hours 12/16/18 1704 12/18/18 0702   12/16/18 1730  vancomycin (VANCOCIN) 2,000 mg in sodium chloride 0.9 % 500 mL IVPB     2,000 mg 250 mL/hr over 120 Minutes Intravenous  Once 12/16/18 1704 12/16/18 2030   12/07/18 1300  erythromycin 500 mg in sodium chloride 0.9 % 100 mL IVPB  Status:  Discontinued     500 mg 100 mL/hr over 60 Minutes Intravenous Every 6 hours 12/07/18 1118 12/16/18 1627   12/06/18 1500  vancomycin (VANCOCIN) 1,250 mg in sodium chloride 0.9 %  250 mL IVPB  Status:  Discontinued     1,250 mg 166.7 mL/hr over 90 Minutes Intravenous Every 12 hours 12/06/18 1436 12/07/18 1118   12/06/18 0100  vancomycin (VANCOCIN) 1,250 mg in sodium chloride 0.9 % 250 mL IVPB     1,250 mg 166.7 mL/hr over 90 Minutes Intravenous  Once 12/05/18 1054 12/06/18 0146   12/05/18 1200  vancomycin (VANCOCIN) 2,000 mg in sodium chloride 0.9 % 500 mL IVPB     2,000 mg 250 mL/hr over 120 Minutes Intravenous  Once 12/05/18 1054 12/05/18 1350   12/05/18 1200  cefTAZidime (FORTAZ) 1 g in sodium chloride 0.9 % 100 mL IVPB  Status:  Discontinued     1 g 200 mL/hr over 30 Minutes Intravenous Every 8 hours 12/05/18 1106 12/07/18 1118   11/30/18 2200  oseltamivir (TAMIFLU) 6 MG/ML suspension 30 mg  30 mg Per Tube 2 times daily 11/30/18 1348 12/03/18 2159   11/30/18 2200  ceFEPIme (MAXIPIME) 1 g in sodium chloride 0.9 % 100 mL IVPB  Status:  Discontinued     1 g 200 mL/hr over 30 Minutes Intravenous Every 12 hours 11/30/18 1350 12/01/18 1039   11/29/18 2200  ceFEPIme (MAXIPIME) 2 g in sodium chloride 0.9 % 100 mL IVPB  Status:  Discontinued     2 g 200 mL/hr over 30 Minutes Intravenous Every 12 hours 11/29/18 1645 11/30/18 1350   11/29/18 1100  oseltamivir (TAMIFLU) 6 MG/ML suspension 75 mg  Status:  Discontinued     75 mg Per Tube 2 times daily 11/29/18 1000 11/30/18 1348   11/28/18 2300  oseltamivir (TAMIFLU) capsule 75 mg  Status:  Discontinued     75 mg Oral 2 times daily 11/28/18 2238 11/29/18 1000   11/28/18 2030  oseltamivir (TAMIFLU) capsule 75 mg     75 mg Oral  Once 11/28/18 2029 11/28/18 2207   11/28/18 1745  vancomycin (VANCOCIN) 2,000 mg in sodium chloride 0.9 % 500 mL IVPB     2,000 mg 250 mL/hr over 120 Minutes Intravenous  Once 11/28/18 1741 11/28/18 2203   11/28/18 1745  piperacillin-tazobactam (ZOSYN) IVPB 3.375 g     3.375 g 100 mL/hr over 30 Minutes Intravenous  Once 11/28/18 1741 11/28/18 1834      Procedures: See above  CONSULTS:   cardiology, pulmonary/intensive care and general surgery  Time spent: 15 minutes-Greater than 50% of this time was spent in counseling, explanation of diagnosis, planning of further management, and coordination of care.  MEDICATIONS: Scheduled Meds:  arformoterol  15 mcg Nebulization BID   aspirin  81 mg Per Tube Daily   atorvastatin  40 mg Per Tube q1800   budesonide (PULMICORT) nebulizer solution  0.5 mg Nebulization BID   carvedilol  3.125 mg Per Tube BID WC   collagenase   Topical Daily   enoxaparin (LOVENOX) injection  40 mg Subcutaneous Q24H   famotidine  20 mg Per Tube BID   feeding supplement (PRO-STAT SUGAR FREE 64)  30 mL Oral BID   folic acid  1 mg Per Tube Daily   guaiFENesin  5 mL Per Tube Q12H   insulin aspart  0-20 Units Subcutaneous TID WC   insulin detemir  25 Units Subcutaneous BID   multivitamin with minerals  1 tablet Oral Daily   polyethylene glycol  17 g Per Tube Daily   tapentadol  50 mg Oral QID   thiamine  100 mg Per Tube Daily   Continuous Infusions:  sodium chloride 10 mL/hr at 01/24/19 2000   PRN Meds:.sodium chloride, acetaminophen (TYLENOL) oral liquid 160 mg/5 mL, albuterol, alum & mag hydroxide-simeth **AND** lidocaine, bisacodyl, clonazePAM, docusate, ipratropium-albuterol, oxyCODONE, Resource ThickenUp Clear, senna   PHYSICAL EXAM: Vital signs: Vitals:   02/16/19 2045 02/16/19 2130 02/17/19 0432 02/17/19 0825  BP:  (!) 171/79 116/70   Pulse: 79 80 78   Resp: 18     Temp:  98.9 F (37.2 C) 98.3 F (36.8 C)   TempSrc:  Oral Oral   SpO2: 97% 90% 96% 98%  Weight:      Height:       Filed Weights   02/11/19 0547 02/12/19 0542 02/13/19 0436  Weight: 81.2 kg 87 kg 63 kg   Body mass index is 18.84 kg/m.   General appearance :Awake, alert, not in any distress.  HEENT: Atraumatic and Normocephalic Resp:Good air  entry bilaterally, no added sounds  CVS: S1 S2 regular, no murmurs.  GI: Bowel sounds present, Non tender and  not distended with no gaurding, rigidity or rebound.No organomegaly Extremities: Left AKA Neurology: Nonfocal Musculoskeletal:No digital cyanosis Skin:No Rash, warm and dry  I have personally reviewed following labs and imaging studies  LABORATORY DATA: CBC: Recent Labs  Lab 02/11/19 0320 02/15/19 0455  WBC 7.1 10.1  NEUTROABS 3.9  --   HGB 10.9* 11.3*  HCT 36.0* 36.2*  MCV 85.7 86.2  PLT 271 264    Basic Metabolic Panel: Recent Labs  Lab 02/11/19 0320 02/15/19 0455  NA 136 136  K 4.5 4.0  CL 97* 97*  CO2 34* 30  GLUCOSE 180* 130*  BUN 16 25*  CREATININE 0.53* 0.59*  CALCIUM 8.8* 9.1  MG 1.7  --     GFR: Estimated Creatinine Clearance: 86.4 mL/min (A) (by C-G formula based on SCr of 0.59 mg/dL (L)).  Liver Function Tests: No results for input(s): AST, ALT, ALKPHOS, BILITOT, PROT, ALBUMIN in the last 168 hours. No results for input(s): LIPASE, AMYLASE in the last 168 hours. No results for input(s): AMMONIA in the last 168 hours.  Coagulation Profile: No results for input(s): INR, PROTIME in the last 168 hours.  Cardiac Enzymes: No results for input(s): CKTOTAL, CKMB, CKMBINDEX, TROPONINI in the last 168 hours.  BNP (last 3 results) No results for input(s): PROBNP in the last 8760 hours.  HbA1C: No results for input(s): HGBA1C in the last 72 hours.  CBG: Recent Labs  Lab 02/16/19 0915 02/16/19 1137 02/16/19 1647 02/16/19 2132 02/17/19 0902  GLUCAP 153* 145* 139* 125* 152*    Lipid Profile: No results for input(s): CHOL, HDL, LDLCALC, TRIG, CHOLHDL, LDLDIRECT in the last 72 hours.  Thyroid Function Tests: No results for input(s): TSH, T4TOTAL, FREET4, T3FREE, THYROIDAB in the last 72 hours.  Anemia Panel: No results for input(s): VITAMINB12, FOLATE, FERRITIN, TIBC, IRON, RETICCTPCT in the last 72 hours.  Urine analysis:    Component Value Date/Time   COLORURINE YELLOW 02/04/2019 1400   APPEARANCEUR HAZY (A) 02/04/2019 1400   LABSPEC  1.028 02/04/2019 1400   PHURINE 5.0 02/04/2019 1400   GLUCOSEU 150 (A) 02/04/2019 1400   HGBUR NEGATIVE 02/04/2019 1400   BILIRUBINUR NEGATIVE 02/04/2019 1400   KETONESUR NEGATIVE 02/04/2019 1400   PROTEINUR 100 (A) 02/04/2019 1400   NITRITE NEGATIVE 02/04/2019 1400   LEUKOCYTESUR NEGATIVE 02/04/2019 1400    Sepsis Labs: Lactic Acid, Venous    Component Value Date/Time   LATICACIDVEN 1.3 01/09/2019 0620    MICROBIOLOGY: No results found for this or any previous visit (from the past 240 hour(s)).  RADIOLOGY STUDIES/RESULTS: Mr Lumbar Spine W Wo Contrast  Result Date: 02/04/2019 CLINICAL DATA:  Coccygeal wound infection. EXAM: MRI LUMBAR SPINE WITHOUT AND WITH CONTRAST TECHNIQUE: Multiplanar and multiecho pulse sequences of the lumbar spine were obtained without and with intravenous contrast. CONTRAST:  7 mL Gadavist COMPARISON:  None. FINDINGS: Segmentation: Normal. The lowest disc space is considered to be L5-S1. Alignment:  Normal Vertebrae: There is no bone marrow edema. No compression fracture. No abnormal contrast enhancement. Normal T1-weighted signal is preserved throughout the visualized spine and sacrum. The coccyx is outside of the field of view. Conus medullaris and cauda equina: The conus medullaris terminates at the L1 level. The cauda equina and conus medullaris are both normal. Paraspinal and other soft tissues: The visualized retroperitoneal organs and paraspinal soft tissues are normal. Disc levels: Sagittal plane imaging includes  the T12-L1 disc level through the upper sacrum, with axial imaging of the L1-2 to L5-S1 disc levels. T12-L1: Normal. L1-2: Normal. L2-3: Disc desiccation with small bulge and superimposed central protrusion. No central spinal canal stenosis. No neural foraminal stenosis. Normal facets. L3-4: Disc desiccation and small central protrusion. No spinal canal or neural foraminal stenosis. L4-5: Minimal disc bulge no spinal canal or neural foraminal  stenosis. L5-S1: Central/right subarticular disc protrusion with narrowing of the right lateral recess. No central spinal canal stenosis or neural foraminal stenosis. The visualized portion of the sacrum is normal. IMPRESSION: 1. No evidence of active osteomyelitis of the lumbar spine or sacrum. Please note that the coccyx is outside the field of view. 2. No fluid collection or other focal abnormality of the soft tissues dorsal to the spine. 3. Multilevel mild degenerative disc disease. Mild narrowing of the right L5-S1 lateral recess could cause radicular symptoms in a right S1 distribution. Electronically Signed   By: Deatra Robinson M.D.   On: 02/04/2019 23:06   Dg Chest Port 1 View  Result Date: 02/04/2019 CLINICAL DATA:  Dyspnea. EXAM: PORTABLE CHEST 1 VIEW COMPARISON:  One-view chest x-ray 02/03/2019 FINDINGS: The heart is mildly enlarged. Mild pulmonary vascular congestion is stable. Elevation of the right hemidiaphragm is again noted. There is some volume loss on the right with associated atelectasis. No significant airspace consolidation is present. A small right effusion is not excluded. IMPRESSION: 1. Mild pulmonary vascular congestion without frank edema. 2. Similar appearance of right basilar atelectasis. Electronically Signed   By: Marin Roberts M.D.   On: 02/04/2019 08:31   Dg Chest Port 1 View  Result Date: 02/03/2019 CLINICAL DATA:  Shortness of breath EXAM: PORTABLE CHEST 1 VIEW COMPARISON:  01/19/2019 FINDINGS: Cardiac shadows within normal limits. Elevation of the right hemidiaphragm is noted with right basilar atelectasis. No other focal infiltrate is seen. Gastrostomy tube is noted within the stomach. IMPRESSION: Mild right basilar atelectasis. Electronically Signed   By: Alcide Clever M.D.   On: 02/03/2019 08:24   Dg Chest Port 1 View  Result Date: 01/19/2019 CLINICAL DATA:  Acute respiratory failure EXAM: PORTABLE CHEST 1 VIEW COMPARISON:  01/15/2019 FINDINGS: Cardiac shadow  is stable. Left lung remains clear. Endotracheal tube is noted in satisfactory position. Right basilar atelectasis is noted increased from the prior exam. No pneumothorax is seen. No acute bony abnormality is noted. IMPRESSION: Increasing right basilar atelectasis. Electronically Signed   By: Alcide Clever M.D.   On: 01/19/2019 07:23   Dg Swallowing Func-speech Pathology  Result Date: 02/04/2019 Objective Swallowing Evaluation: Type of Study: MBS-Modified Barium Swallow Study  Patient Details Name: Antonio [name]. MRN: [id number] Date of Birth: 02/07/1956 Today's Date: 02/04/2019 Time: SLP Start Time (ACUTE ONLY): 0920 -SLP Stop Time (ACUTE ONLY): 0931 SLP Time Calculation (min) (ACUTE ONLY): 11 min Past Medical History: Past Medical History: Diagnosis Date  Coronary artery disease   Diabetes mellitus without complication (HCC)   Hyperlipidemia   Hypertension   Osteomyelitis (HCC) 03/25/2017  RT FOOT Past Surgical History: Past Surgical History: Procedure Laterality Date  AMPUTATION Right 03/27/2017  Procedure: 1st Ray Amputation Right Foot;  Surgeon: Nadara Mustard, MD;  Location: [facility] OR;  Service: Orthopedics;  Laterality: Right;  APPENDECTOMY    BELOW KNEE LEG AMPUTATION Left   CARDIAC CATHETERIZATION    CORONARY STENT INTERVENTION  2005  Great toe amputation right.    I&D EXTREMITY Left 12/30/2015  Procedure: IRRIGATION AND DEBRIDEMENT LEFT FOOT, TRANSMETATARSAL AMPUTATION  WITH APPLICATION OF ANTIBIOTIC BEADS AND WOUND VAC;  Surgeon: Nadara Mustard, MD;  Location: [facility] OR;  Service: Orthopedics;  Laterality: Left;  IR GASTROSTOMY TUBE MOD SED  12/23/2018  TONSILLECTOMY    TRACHEOSTOMY  11/2018 HPI: 63 year old male coming in with fever cough chest pain and shortness of breath was found to have influenza A. PMH: diabetes mellitus, hypertension.  Intubated multiple times 1/3 to 1/16 with trach placed 1/16. Repeat MBS on 3/28 for possible initiation of po's. This is pt's 3rd MBS. Plan to repeat to  determine upgrade initially; on 3/10 concern pt may have aspirated and made NPO.   Subjective: pt is alert and cooperative but needs encouragement Assessment / Plan / Recommendation CHL IP CLINICAL IMPRESSIONS 02/04/2019 Clinical Impression Pt has been decannulated since 3/9 with evidence of incomplete stoma closure indicated by breathy vocal quality. He continues to exhibit moderate-severe pharyngeal dysphagia noteably due to incompetent laryngeal closure and poor retroflexion of epiglottis. Honey thick penetrated vestibule with initial swallow of MBS and expelled with cues for cough and was not penetrated with subsequent trial. Nectar thick entered vestibule with penetration aspiration score (PAS) of 3, 5 & 7 (did not exit trachea with cough). Chin tuck actually facilitated entrance of barium into trachea and swallow with head in neutral applying pressure to gauze/stoma to increase pressure was ineffective. Mild-mod amount of vallecular and pyriform sinus residue not significantly improved. Recommend pt resume po's of honey thick liquids, texture of Dys 2, multiple swallows and hard coughs/throat clear after every other bite/sip. Pt should continue to receive ST at skilled nursing facility for continued intervention and repeat MBS when appropriate (not before 6-8 weeks). Will continue to follow while here.  SLP Visit Diagnosis Dysphagia, pharyngeal phase (R13.13) Attention and concentration deficit following -- Frontal lobe and executive function deficit following -- Impact on safety and function Severe aspiration risk   CHL IP TREATMENT RECOMMENDATION 02/04/2019 Treatment Recommendations Therapy as outlined in treatment plan below   Prognosis 02/04/2019 Prognosis for Safe Diet Advancement Good Barriers to Reach Goals -- Barriers/Prognosis Comment -- CHL IP DIET RECOMMENDATION 02/04/2019 SLP Diet Recommendations Dysphagia 2 (Fine chop) solids;Honey thick liquids Liquid Administration via Cup;No straw Medication  Administration Whole meds with puree Compensations Slow rate;Small sips/bites;Multiple dry swallows after each bite/sip;Hard cough after swallow;Clear throat intermittently Postural Changes Seated upright at 90 degrees;Remain semi-upright after after feeds/meals (Comment)   CHL IP OTHER RECOMMENDATIONS 02/04/2019 Recommended Consults -- Oral Care Recommendations Oral care BID Other Recommendations --   CHL IP FOLLOW UP RECOMMENDATIONS 02/04/2019 Follow up Recommendations Skilled Nursing facility   [facility] IP FREQUENCY AND DURATION 02/04/2019 Speech Therapy Frequency (ACUTE ONLY) min 2x/week Treatment Duration 2 weeks      CHL IP ORAL PHASE 02/04/2019 Oral Phase WFL Oral - Pudding Teaspoon -- Oral - Pudding Cup -- Oral - Honey Teaspoon -- Oral - Honey Cup WFL Oral - Nectar Teaspoon -- Oral - Nectar Cup WFL Oral - Nectar Straw -- Oral - Thin Teaspoon -- Oral - Thin Cup NT Oral - Thin Straw -- Oral - Puree NT Oral - Mech Soft -- Oral - Regular NT Oral - Multi-Consistency -- Oral - Pill -- Oral Phase - Comment --  CHL IP PHARYNGEAL PHASE 02/04/2019 Pharyngeal Phase Impaired Pharyngeal- Pudding Teaspoon -- Pharyngeal -- Pharyngeal- Pudding Cup -- Pharyngeal -- Pharyngeal- Honey Teaspoon NT Pharyngeal -- Pharyngeal- Honey Cup Penetration/Aspiration during swallow;Pharyngeal residue - valleculae;Pharyngeal residue - pyriform;Reduced airway/laryngeal closure Pharyngeal Material enters airway, remains ABOVE vocal cords and  not ejected out Pharyngeal- Nectar Teaspoon NT Pharyngeal -- Pharyngeal- Nectar Cup Penetration/Aspiration during swallow;Reduced airway/laryngeal closure;Pharyngeal residue - valleculae;Pharyngeal residue - pyriform Pharyngeal Material enters airway, passes BELOW cords and not ejected out despite cough attempt by patient;Material enters airway, CONTACTS cords and not ejected out Pharyngeal- Nectar Straw -- Pharyngeal -- Pharyngeal- Thin Teaspoon NT Pharyngeal -- Pharyngeal- Thin Cup NT Pharyngeal -- Pharyngeal-  Thin Straw -- Pharyngeal -- Pharyngeal- Puree NT Pharyngeal -- Pharyngeal- Mechanical Soft -- Pharyngeal -- Pharyngeal- Regular NT Pharyngeal -- Pharyngeal- Multi-consistency -- Pharyngeal -- Pharyngeal- Pill -- Pharyngeal -- Pharyngeal Comment --  CHL IP CERVICAL ESOPHAGEAL PHASE 02/04/2019 Cervical Esophageal Phase WFL Pudding Teaspoon -- Pudding Cup -- Honey Teaspoon -- Honey Cup -- Nectar Teaspoon -- Nectar Cup -- Nectar Straw -- Thin Teaspoon -- Thin Cup -- Thin Straw -- Puree -- Mechanical Soft -- Regular -- Multi-consistency -- Pill -- Cervical Esophageal Comment -- Royce Macadamia 02/04/2019, 10:23 AM Breck Coons [employer] administrator Pager [phone] Office [phone]              Dg Swallowing Func-speech Pathology  Result Date: 01/30/2019 Objective Swallowing Evaluation: Type of Study: MBS-Modified Barium Swallow Study  Patient Details Name: Antonio Viteri. MRN: [id number] Date of Birth: 27-Jul-1956 Today's Date: 01/30/2019 Time: SLP Start Time (ACUTE ONLY): 1328 -SLP Stop Time (ACUTE ONLY): 1348 SLP Time Calculation (min) (ACUTE ONLY): 20 min Past Medical History: Past Medical History: Diagnosis Date  Coronary artery disease   Diabetes mellitus without complication (HCC)   Hyperlipidemia   Hypertension   Osteomyelitis (HCC) 03/25/2017  RT FOOT Past Surgical History: Past Surgical History: Procedure Laterality Date  AMPUTATION Right 03/27/2017  Procedure: 1st Ray Amputation Right Foot;  Surgeon: Nadara Mustard, MD;  Location: [facility] OR;  Service: Orthopedics;  Laterality: Right;  APPENDECTOMY    BELOW KNEE LEG AMPUTATION Left   CARDIAC CATHETERIZATION    CORONARY STENT INTERVENTION  2005  Great toe amputation right.    I&D EXTREMITY Left 12/30/2015  Procedure: IRRIGATION AND DEBRIDEMENT LEFT FOOT, TRANSMETATARSAL AMPUTATION WITH APPLICATION OF ANTIBIOTIC BEADS AND WOUND VAC;  Surgeon: Nadara Mustard, MD;  Location: [facility] OR;  Service: Orthopedics;  Laterality: Left;  IR  GASTROSTOMY TUBE MOD SED  12/23/2018  TONSILLECTOMY    TRACHEOSTOMY  11/2018 HPI: 63 year old male coming in with fever cough chest pain and shortness of breath was found to have influenza A. PMH: diabetes mellitus, hypertension.  Intubated multiple times 1/3 to 1/16 with trach placed 1/16. Repeat MBS on 3/28 for possible initiation of po's.  Subjective: pt is alert and cooperative but needs encouragement Assessment / Plan / Recommendation CHL IP CLINICAL IMPRESSIONS 01/30/2019 Clinical Impression Pt's trach was capped this morning and during MBS with normal oropharyngeal pressures. Swallow function has improved from prior study despite silent aspiration with thin and penetration of nectar and honey. Laryngeal excursion, pharyngeal contraction and epiglottic inversion are not of normal strength and ROM however improved from last week. Incomplete closure led to penetration with thin, nectar and only flash with honey thick x 1. Chin tuck did not prevent penetration (silent) with nectar and vallecular and pyriform sinus residue present and was mildly increased with honey thick. Verbal cues to perform second swallow reduced volume. Puree was not penetrated this study. Recommend Dys 2 given mild oral residue, impulsivity and deconditioning. Honey thick liquids recommended with two swallows, intermittent throat clears and pills whole in applesauce. ST will continue to follow for safety and upgrade of  po textures. SLP Visit Diagnosis Dysphagia, oropharyngeal phase (R13.12) Attention and concentration deficit following -- Frontal lobe and executive function deficit following -- Impact on safety and function Moderate aspiration risk;Severe aspiration risk   CHL IP TREATMENT RECOMMENDATION 01/30/2019 Treatment Recommendations Therapy as outlined in treatment plan below   Prognosis 01/30/2019 Prognosis for Safe Diet Advancement Good Barriers to Reach Goals -- Barriers/Prognosis Comment -- CHL IP DIET RECOMMENDATION 01/30/2019 SLP  Diet Recommendations Honey thick liquids;Dysphagia 2 (Fine chop) solids Liquid Administration via Cup;No straw Medication Administration Whole meds with puree Compensations Slow rate;Small sips/bites;Multiple dry swallows after each bite/sip;Clear throat intermittently Postural Changes Seated upright at 90 degrees   CHL IP OTHER RECOMMENDATIONS 01/30/2019 Recommended Consults -- Oral Care Recommendations Oral care BID Other Recommendations --   CHL IP FOLLOW UP RECOMMENDATIONS 01/30/2019 Follow up Recommendations (No Data)   CHL IP FREQUENCY AND DURATION 01/30/2019 Speech Therapy Frequency (ACUTE ONLY) min 2x/week Treatment Duration 2 weeks      CHL IP ORAL PHASE 01/30/2019 Oral Phase Impaired Oral - Pudding Teaspoon -- Oral - Pudding Cup -- Oral - Honey Teaspoon -- Oral - Honey Cup Lingual/palatal residue Oral - Nectar Teaspoon -- Oral - Nectar Cup Lingual/palatal residue Oral - Nectar Straw -- Oral - Thin Teaspoon -- Oral - Thin Cup -- Oral - Thin Straw -- Oral - Puree Lingual/palatal residue Oral - Mech Soft -- Oral - Regular WFL Oral - Multi-Consistency -- Oral - Pill -- Oral Phase - Comment --  CHL IP PHARYNGEAL PHASE 01/30/2019 Pharyngeal Phase Impaired Pharyngeal- Pudding Teaspoon -- Pharyngeal -- Pharyngeal- Pudding Cup -- Pharyngeal -- Pharyngeal- Honey Teaspoon NT Pharyngeal -- Pharyngeal- Honey Cup Pharyngeal residue - valleculae;Pharyngeal residue - pyriform;Reduced epiglottic inversion;Penetration/Aspiration during swallow;Reduced airway/laryngeal closure Pharyngeal Material enters airway, remains ABOVE vocal cords then ejected out Pharyngeal- Nectar Teaspoon NT Pharyngeal -- Pharyngeal- Nectar Cup Penetration/Aspiration during swallow;Reduced airway/laryngeal closure;Reduced epiglottic inversion;Pharyngeal residue - valleculae;Pharyngeal residue - pyriform;Compensatory strategies attempted (with notebox) Pharyngeal Material enters airway, remains ABOVE vocal cords and not ejected out Pharyngeal- Nectar Straw --  Pharyngeal -- Pharyngeal- Thin Teaspoon NT Pharyngeal -- Pharyngeal- Thin Cup Penetration/Aspiration during swallow;Pharyngeal residue - valleculae;Pharyngeal residue - pyriform;Moderate aspiration;Trace aspiration;Reduced airway/laryngeal closure;Reduced epiglottic inversion Pharyngeal Material enters airway, passes BELOW cords without attempt by patient to eject out (silent aspiration) Pharyngeal- Thin Straw -- Pharyngeal -- Pharyngeal- Puree Pharyngeal residue - pyriform;Pharyngeal residue - valleculae;Reduced epiglottic inversion Pharyngeal Material does not enter airway Pharyngeal- Mechanical Soft -- Pharyngeal -- Pharyngeal- Regular WFL Pharyngeal -- Pharyngeal- Multi-consistency -- Pharyngeal -- Pharyngeal- Pill -- Pharyngeal -- Pharyngeal Comment --  CHL IP CERVICAL ESOPHAGEAL PHASE 01/30/2019 Cervical Esophageal Phase WFL Pudding Teaspoon -- Pudding Cup -- Honey Teaspoon -- Honey Cup -- Nectar Teaspoon -- Nectar Cup -- Nectar Straw -- Thin Teaspoon -- Thin Cup -- Thin Straw -- Puree -- Mechanical Soft -- Regular -- Multi-consistency -- Pill -- Cervical Esophageal Comment -- Royce Macadamia 01/30/2019, 2:53 PM Breck Coons [employer] M[employer] Sports administrator Pager [phone] Office [phone]              Dg Swallowing Func-speech Pathology  Result Date: 01/23/2019 Objective Swallowing Evaluation: Type of Study: MBS-Modified Barium Swallow Study  Patient Details Name: Antonio [name]. MRN: [id number] Date of Birth: 01-30-1956 Today's Date: 01/23/2019 Time: SLP Start Time (ACUTE ONLY): 1254 -SLP Stop Time (ACUTE ONLY): 1308 SLP Time Calculation (min) (ACUTE ONLY): 14 min Past Medical History: Past Medical History: Diagnosis Date  Coronary artery disease   Diabetes mellitus without  complication (HCC)   Hyperlipidemia   Hypertension   Osteomyelitis (HCC) 03/25/2017  RT FOOT Past Surgical History: Past Surgical History: Procedure Laterality Date  AMPUTATION Right 03/27/2017  Procedure: 1st  Ray Amputation Right Foot;  Surgeon: Nadara Mustard, MD;  Location: [facility] OR;  Service: Orthopedics;  Laterality: Right;  APPENDECTOMY    BELOW KNEE LEG AMPUTATION Left   CARDIAC CATHETERIZATION    CORONARY STENT INTERVENTION  2005  Great toe amputation right.    I&D EXTREMITY Left 12/30/2015  Procedure: IRRIGATION AND DEBRIDEMENT LEFT FOOT, TRANSMETATARSAL AMPUTATION WITH APPLICATION OF ANTIBIOTIC BEADS AND WOUND VAC;  Surgeon: Nadara Mustard, MD;  Location: [facility] OR;  Service: Orthopedics;  Laterality: Left;  IR GASTROSTOMY TUBE MOD SED  12/23/2018  TONSILLECTOMY    TRACHEOSTOMY  11/2018 HPI: 63 year old male coming in with fever cough chest pain and shortness of breath was found to have influenza A. PMH: diabetes mellitus, hypertension.  Intubated multiple times 1/3 to 1/16 with trach placed 1/16.   Subjective: pt is alert and cooperative but needs encouragement Assessment / Plan / Recommendation CHL IP CLINICAL IMPRESSIONS 01/23/2019 Clinical Impression Pt has a moderate pharyngeal dysphagia due to reduced strength and airway closure s/p prolonged (and repeated) intubations. His timing for swallow trigger is good, but he has reduced hyolaryngeal movement, pharyngeal squeeze, epiglottic inversion, and tonge base retraction. Thin and nectar thick liquids enter the laryngeal vestibule during the swallow because of his incomplete closure, with penetration intermittently reaching the true vocal folds without spontaneous cough elicited. Multiple cued coughs were required to eject penetrates. Honey thick liquids and purees left more residue in the valleculae and pyriform sinuses, with intermittent penetration after the swallow from the residual material. Recommend he remain NPO except for initiating small amounts of purees and spoonfuls of thickened liquids during SLP visits only to facilitate restrengthening. Pt may also benefit from EMST and other exercises to progress strength of cough and pharyngeal musculature.   SLP  Visit Diagnosis Dysphagia, pharyngeal phase (R13.13) Attention and concentration deficit following -- Frontal lobe and executive function deficit following -- Impact on safety and function Moderate aspiration risk   CHL IP TREATMENT RECOMMENDATION 01/23/2019 Treatment Recommendations Therapy as outlined in treatment plan below   Prognosis 01/23/2019 Prognosis for Safe Diet Advancement Good Barriers to Reach Goals -- Barriers/Prognosis Comment -- CHL IP DIET RECOMMENDATION 01/23/2019 SLP Diet Recommendations NPO Liquid Administration via -- Medication Administration Via alternative means Compensations -- Postural Changes --   CHL IP OTHER RECOMMENDATIONS 01/23/2019 Recommended Consults -- Oral Care Recommendations Oral care QID Other Recommendations Have oral suction available   CHL IP FOLLOW UP RECOMMENDATIONS 01/23/2019 Follow up Recommendations Inpatient Rehab   CHL IP FREQUENCY AND DURATION 01/23/2019 Speech Therapy Frequency (ACUTE ONLY) min 2x/week Treatment Duration 2 weeks      CHL IP ORAL PHASE 01/23/2019 Oral Phase WFL Oral - Pudding Teaspoon -- Oral - Pudding Cup -- Oral - Honey Teaspoon -- Oral - Honey Cup -- Oral - Nectar Teaspoon -- Oral - Nectar Cup -- Oral - Nectar Straw -- Oral - Thin Teaspoon -- Oral - Thin Cup -- Oral - Thin Straw -- Oral - Puree -- Oral - Mech Soft -- Oral - Regular -- Oral - Multi-Consistency -- Oral - Pill -- Oral Phase - Comment --  CHL IP PHARYNGEAL PHASE 01/23/2019 Pharyngeal Phase Impaired Pharyngeal- Pudding Teaspoon -- Pharyngeal -- Pharyngeal- Pudding Cup -- Pharyngeal -- Pharyngeal- Honey Teaspoon Reduced pharyngeal peristalsis;Reduced epiglottic inversion;Reduced anterior laryngeal mobility;Reduced laryngeal elevation;Reduced  airway/laryngeal closure;Reduced tongue base retraction;Pharyngeal residue - valleculae;Pharyngeal residue - pyriform Pharyngeal -- Pharyngeal- Honey Cup -- Pharyngeal -- Pharyngeal- Nectar Teaspoon Reduced pharyngeal peristalsis;Reduced epiglottic  inversion;Reduced anterior laryngeal mobility;Reduced laryngeal elevation;Reduced airway/laryngeal closure;Reduced tongue base retraction;Pharyngeal residue - valleculae;Pharyngeal residue - pyriform;Penetration/Aspiration during swallow Pharyngeal Material enters airway, remains ABOVE vocal cords and not ejected out Pharyngeal- Nectar Cup Reduced pharyngeal peristalsis;Reduced epiglottic inversion;Reduced anterior laryngeal mobility;Reduced laryngeal elevation;Reduced airway/laryngeal closure;Reduced tongue base retraction;Pharyngeal residue - valleculae;Pharyngeal residue - pyriform;Penetration/Aspiration during swallow Pharyngeal Material enters airway, remains ABOVE vocal cords and not ejected out Pharyngeal- Nectar Straw -- Pharyngeal -- Pharyngeal- Thin Teaspoon Reduced pharyngeal peristalsis;Reduced epiglottic inversion;Reduced anterior laryngeal mobility;Reduced laryngeal elevation;Reduced airway/laryngeal closure;Reduced tongue base retraction;Penetration/Aspiration during swallow Pharyngeal Material enters airway, CONTACTS cords and not ejected out Pharyngeal- Thin Cup -- Pharyngeal -- Pharyngeal- Thin Straw -- Pharyngeal -- Pharyngeal- Puree Reduced pharyngeal peristalsis;Reduced epiglottic inversion;Reduced anterior laryngeal mobility;Reduced laryngeal elevation;Reduced airway/laryngeal closure;Reduced tongue base retraction;Pharyngeal residue - valleculae;Pharyngeal residue - pyriform;Penetration/Apiration after swallow Pharyngeal Material enters airway, remains ABOVE vocal cords and not ejected out Pharyngeal- Mechanical Soft -- Pharyngeal -- Pharyngeal- Regular -- Pharyngeal -- Pharyngeal- Multi-consistency -- Pharyngeal -- Pharyngeal- Pill -- Pharyngeal -- Pharyngeal Comment --  CHL IP CERVICAL ESOPHAGEAL PHASE 01/23/2019 Cervical Esophageal Phase WFL Pudding Teaspoon -- Pudding Cup -- Honey Teaspoon -- Honey Cup -- Nectar Teaspoon -- Nectar Cup -- Nectar Straw -- Thin Teaspoon -- Thin Cup -- Thin  Straw -- Puree -- Mechanical Soft -- Regular -- Multi-consistency -- Pill -- Cervical Esophageal Comment -- Virl Axe Nix 01/23/2019, 2:32 PM  [name], M.A. CCC-SLP Acute Rehabilitation Services Pager [phone] Office [phone]               LOS: 81 days   Jeoffrey Massed, MD  [facility]  If 7PM-7AM, please contact night-coverage  Please page via [email or web address]  Go to [email or web address] and use [place]'s universal password to access. If you do not have the password, please contact the hospital operator.  Locate the [facility] provider you are looking for under [facility] and page to a number that you can be directly reached. If you still have difficulty reaching the provider, please page the [facility] (Director on Call) for the Hospitalists listed on amion for assistance.  02/17/2019, 11:16 AM

## 2019-02-17 NOTE — Progress Notes (Signed)
Physical Therapy Wound Treatment Patient Details  Name: Antonio [name]. MRN: [id number] Date of Birth: [birth year]  Today's Date: 02/17/2019 Time: 8242-3536 Time Calculation (min): 36 min  Subjective  Subjective: Pt c/o pain left lateral periwound area Patient and Family Stated Goals: "go home." Prior Treatments: dressing changes  Pain Score: Pain Score: 8 with dressing change. Pt premedicated.  Wound Assessment  Pressure Injury 12/01/18 Unstageable - Full thickness tissue loss in which the base of the ulcer is covered by slough (yellow, tan, gray, green or brown) and/or eschar (tan, brown or black) in the wound bed. sacrum (Active)  Dressing Type ABD;Gauze (Comment);Moist to dry;Barrier Film (skin prep) 02/17/2019 10:45 AM  Dressing Clean;Dry;Intact;Changed 02/17/2019 10:45 AM  Dressing Change Frequency Other (Comment) 02/17/2019 10:45 AM  State of Healing Eschar 02/17/2019 10:45 AM  Site / Wound Assessment Red;Yellow;Pink 02/17/2019 10:45 AM  % Wound base Red or Granulating 50% 02/17/2019 10:45 AM  % Wound base Yellow/Fibrinous Exudate 50% 02/17/2019 10:45 AM  % Wound base Black/Eschar 0% 02/17/2019 10:45 AM  % Wound base Other/Granulation Tissue (Comment) 0% 02/17/2019 10:45 AM  Peri-wound Assessment Erythema (blanchable);Pink 02/17/2019 10:45 AM  Wound Length (cm) 7.5 cm 02/14/2019 10:00 AM  Wound Width (cm) 3 cm 02/14/2019 10:00 AM  Wound Depth (cm) 2 cm 02/14/2019 10:00 AM  Wound Surface Area (cm^2) 22.5 cm^2 02/14/2019 10:00 AM  Wound Volume (cm^3) 45 cm^3 02/14/2019 10:00 AM  Tunneling (cm) 8 cm at 9 oclock 02/17/2019 10:45 AM  Undermining (cm) 7.5 02/14/2019 10:00 AM  Margins Unattached edges (unapproximated) 02/17/2019 10:45 AM  Drainage Amount Copious 02/17/2019 10:45 AM  Drainage Description Purulent 02/17/2019 10:45 AM  Treatment Debridement (Selective);Hydrotherapy (Pulse lavage);Packing (Saline gauze) 02/17/2019 10:45 AM   Santyl applied to wound bed prior to applying dressing.     Hydrotherapy Pulsed lavage therapy - wound location: sacrum Pulsed Lavage with Suction (psi): 12 psi Pulsed Lavage with Suction - Normal Saline Used: 1000 mL Pulsed Lavage Tip: Tip with splash shield Selective Debridement Selective Debridement - Location: sacrum Selective Debridement - Tools Used: Forceps;Scissors Selective Debridement - Tissue Removed: Soupy yellow necrotic tissue   Wound Assessment and Plan  Wound Therapy - Assess/Plan/Recommendations Wound Therapy - Clinical Statement: Pt continues to have copious purulent drainage that soaks through ABD to bed pad despite dressing change of TID yesterday. At 9 oclock woud tunnels to 8 cm. Pt with pain to palpation 10 cm laterally from lt lateral wound edge Wound Therapy - Functional Problem List: decr mobility Factors Delaying/Impairing Wound Healing: Diabetes Mellitus;Immobility;Multiple medical problems Hydrotherapy Plan: Debridement;Dressing change;Patient/family education;Pulsatile lavage with suction Wound Therapy - Frequency: 6X / week Wound Therapy - Follow Up Recommendations: Skilled nursing facility Wound Plan: see above  Wound Therapy Goals- Improve the function of patient's integumentary system by progressing the wound(s) through the phases of wound healing (inflammation - proliferation - remodeling) by: Decrease Necrotic Tissue to: 40 Decrease Necrotic Tissue - Progress: Not progressing Increase Granulation Tissue to: 60 Increase Granulation Tissue - Progress: Mot progressing  Goals will be updated until maximal potential achieved or discharge criteria met.  Discharge criteria: when goals achieved, discharge from hospital, MD decision/surgical intervention, no progress towards goals, refusal/missing three consecutive treatments without notification or medical reason.  GP     Shary Decamp [facility] 02/17/2019, 10:52 AM [place] Pager [phone] Office [phone]

## 2019-02-17 NOTE — Progress Notes (Signed)
Occupational Therapy Treatment Patient Details Name: Antonio [name]. MRN: [id number] DOB: 1956-09-08 Today's Date: 02/17/2019    History of present illness Pt is a 63 y.o. male admitted 11/28/18 with flu A and PNA; acute respiratory failure intubated 1/4. Trach placed 1/16. PEG placed 1/28. Hospital course complicared by acute MI, CHF, cardiogenic shock, and mucous plugging. Also with sacral wound.  De-cannulated 02/02/19. PMH includes DM, HTN, HLD, CAD, L BKA, R transmet amputation.   OT comments  Focus of session on improving RUE function through scapular mobilization, grade 1 anterior/posterior humeral glides, contract/relax, soft tissue mobilization and PNF ROM and strengthening patterns. Increased PROM after session. Pt reports arm feels "better". Will continue to follow acutely.   Follow Up Recommendations  SNF;Supervision - Intermittent    Equipment Recommendations  Other (comment)(TBA)    Recommendations for Other Services      Precautions / Restrictions Precautions Precautions: Fall Precaution Comments: PEG, monitor O2 sats Required Braces or Orthoses: Other Brace Other Brace: L LE prosthesis       Mobility Bed Mobility                  Transfers                      Balance                                           ADL either performed or assessed with clinical judgement   ADL                                               Vision       Perception     Praxis      Cognition Arousal/Alertness: Awake/alert Behavior During Therapy: WFL for tasks assessed/performed Overall Cognitive Status: Within Functional Limits for tasks assessed                                          Exercises Other Exercises Other Exercises: R scapular mobility followed by A/AAROM in PNF patterns Other Exercises: scapular protraction/retraction Other Exercises: attempts to release subscapularis Other  Exercises: gental naterior/posterior humeral glides Other Exercises: PNF patterns in supine x 10   Shoulder Instructions       General Comments      Pertinent Vitals/ Pain       Pain Assessment: Faces Faces Pain Scale: Hurts even more Pain Location: with RUE shoulder ROM Pain Descriptors / Indicators: Discomfort;Grimacing Pain Intervention(s): Limited activity within patient's tolerance  Home Living                                          Prior Functioning/Environment              Frequency  Min 3X/week        Progress Toward Goals  OT Goals(current goals can now be found in the care plan section)  Progress towards OT goals: Progressing toward goals  Acute Rehab OT Goals Patient Stated Goal: to use his R arm better  OT Goal Formulation: With patient Time For Goal Achievement: 02/25/19 Potential to Achieve Goals: Good ADL Goals Pt Will Perform Grooming: standing;with supervision Pt Will Perform Upper Body Dressing: with set-up;sitting Pt Will Transfer to Toilet: with supervision;ambulating;bedside commode Pt/caregiver will Perform Home Exercise Program: Increased ROM;Increased strength;Right Upper extremity;With minimal assist Additional ADL Goal #1: Pt will perform bed mobility modified independently from flat bed without rails. Additional ADL Goal #2: Pt will gather items necessary for ADL around his room with RW and min assist. Additional ADL Goal #3: Pt will follow 50% on cues in quiet environement during ADL  Plan Discharge plan remains appropriate;Frequency remains appropriate    Co-evaluation                 AM-PAC OT "6 Clicks" Daily Activity     Outcome Measure   Help from another person eating meals?: None Help from another person taking care of personal grooming?: None Help from another person toileting, which includes using toliet, bedpan, or urinal?: A Lot Help from another person bathing (including washing, rinsing,  drying)?: A Little Help from another person to put on and taking off regular upper body clothing?: A Little Help from another person to put on and taking off regular lower body clothing?: A Lot 6 Click Score: 18    End of Session    OT Visit Diagnosis: Unsteadiness on feet (R26.81);Other abnormalities of gait and mobility (R26.89);Muscle weakness (generalized) (M62.81);Other symptoms and signs involving cognitive function   Activity Tolerance Patient tolerated treatment well   Patient Left in bed   Nurse Communication Mobility status        Time: 6433-2951 OT Time Calculation (min): 36 min  Charges: OT General Charges $OT Visit: 1 Visit OT Treatments $Therapeutic Exercise: 23-37 mins  Luisa Dago, OT/L   Acute OT Clinical Specialist Acute Rehabilitation Services Pager [phone] Office [phone]    [facility] 02/17/2019, 1:54 PM

## 2019-02-17 NOTE — Consult Note (Addendum)
WOC Nurse wound follow up Patient receiving care in [facility] 5W10.  No visitors present.  PT in room for hydrotherapy treatment.   Wound type: Stage 4 PI Measurement: Please see PTs comprehensive note from today Wound bed: Base of wound bed appears necrotic Drainage (amount, consistency, odor) VERY heavy brown drainage occurring even in the face of TID dressing changes.  The drainage has heavily soaked through the guaze packing, ABD pads and underlying Dermatherapy under pads.   Periwound: Extensive erythematous border negatively impacted by fungal involvement from all the drainage. Dressing procedure/placement/frequency: hydrotherapy and Santyl. Additional findings are that the pain the patient experiences to light palpation begins at 10 cm from the wound edge on the left buttock.  Pain with light palpation has been present for weeks, and only seems to be worsening.  It does appear that there is new tissue growth within the wound tunnel that leads to the base of the wound.  My fear is that the wound will close and leave an extensive area of pocketing perfect for an abscess to develop.  I reached out to Leary Roca, [place] for the surgical group and expressed my concerns about the wound and again asked for consideration for surgical exploration of underlying tissues and removal of necrotic areas.  He agreed to speak with his attending about the situation.  Helmut Muster, RN, MSN, CWOCN, CNS-BC, pager [phone]

## 2019-02-18 LAB — GLUCOSE, CAPILLARY
GLUCOSE-CAPILLARY: 104 mg/dL — AB (ref 70–99)
Glucose-Capillary: 60 mg/dL — ABNORMAL LOW (ref 70–99)
Glucose-Capillary: 114 mg/dL — ABNORMAL HIGH (ref 70–99)
Glucose-Capillary: 101 mg/dL — ABNORMAL HIGH (ref 70–99)
Glucose-Capillary: 118 mg/dL — ABNORMAL HIGH (ref 70–99)

## 2019-02-18 MED ORDER — INSULIN DETEMIR 100 UNIT/ML ~~LOC~~ SOLN
15.0000 [IU] | Freq: Once | SUBCUTANEOUS | Status: AC
  Administered 2019-02-18: 15 [IU] via SUBCUTANEOUS
  Filled 2019-02-18: qty 0.15

## 2019-02-18 MED ORDER — INSULIN DETEMIR 100 UNIT/ML ~~LOC~~ SOLN
20.0000 [IU] | Freq: Two times a day (BID) | SUBCUTANEOUS | Status: DC
  Administered 2019-02-19: 20 [IU] via SUBCUTANEOUS
  Filled 2019-02-18 (×2): qty 0.2

## 2019-02-18 NOTE — Progress Notes (Signed)
Physical Therapy Wound Treatment Patient Details  Name: Antonio [name]. MRN: [id number] Date of Birth: 1955-12-11  Today's Date: 02/18/2019 Time: 1009-1049 Time Calculation (min): 40 min  Subjective  Subjective: Pt stating, "I think there's an abscess there." Patient and Family Stated Goals: "go home." Prior Treatments: dressing changes  Pain Score:  Faces: 8 with dressing change; Pre medicated  Wound Assessment  Pressure Injury 12/01/18 Unstageable - Full thickness tissue loss in which the base of the ulcer is covered by slough (yellow, tan, gray, green or brown) and/or eschar (tan, brown or black) in the wound bed. sacrum (Active)  Wound Image   02/18/2019 12:01 PM  Dressing Type ABD;Gauze (Comment);Moist to dry;Barrier Film (skin prep) 02/18/2019 12:01 PM  Dressing Clean;Dry;Intact;Changed 02/18/2019 12:01 PM  Dressing Change Frequency Other (Comment) 02/18/2019 12:01 PM  State of Healing Eschar 02/18/2019 12:01 PM  Site / Wound Assessment Red;Yellow;Pink 02/18/2019 12:01 PM  % Wound base Red or Granulating 50% 02/18/2019 12:01 PM  % Wound base Yellow/Fibrinous Exudate 50% 02/18/2019 12:01 PM  % Wound base Black/Eschar 0% 02/18/2019 12:01 PM  % Wound base Other/Granulation Tissue (Comment) 0% 02/18/2019 12:01 PM  Peri-wound Assessment Erythema (blanchable);Pink 02/18/2019 12:01 PM  Wound Length (cm) 7 cm 02/18/2019 12:01 PM  Wound Width (cm) 5 cm 02/18/2019 12:01 PM  Wound Depth (cm) 4 cm 02/18/2019 12:01 PM  Wound Surface Area (cm^2) 35 cm^2 02/18/2019 12:01 PM  Wound Volume (cm^3) 140 cm^3 02/18/2019 12:01 PM  Tunneling (cm) 8 02/18/2019 12:01 PM  Undermining (cm) 4-7 02/18/2019 12:01 PM  Margins Unattached edges (unapproximated) 02/18/2019 12:01 PM  Drainage Amount Copious 02/18/2019 12:01 PM  Drainage Description Purulent 02/18/2019 12:01 PM  Treatment Debridement (Selective);Hydrotherapy (Pulse lavage);Packing (Saline gauze) 02/18/2019 12:01 PM      Hydrotherapy Pulsed lavage therapy  - wound location: sacrum Pulsed Lavage with Suction (psi): 12 psi Pulsed Lavage with Suction - Normal Saline Used: 1000 mL Pulsed Lavage Tip: Tip with splash shield Selective Debridement Selective Debridement - Location: sacrum Selective Debridement - Tools Used: Forceps;Scissors Selective Debridement - Tissue Removed: Soupy yellow necrotic tissue   Wound Assessment and Plan  Wound Therapy - Assess/Plan/Recommendations Wound Therapy - Clinical Statement: Pt continues with copious purulent drainage that soaks through ABD to bed pad. Discussed TID dressing changes with pt and surgery.  Wound Therapy - Functional Problem List: decr mobility Factors Delaying/Impairing Wound Healing: Diabetes Mellitus;Immobility;Multiple medical problems Hydrotherapy Plan: Debridement;Dressing change;Patient/family education;Pulsatile lavage with suction Wound Therapy - Frequency: 6X / week Wound Therapy - Follow Up Recommendations: Skilled nursing facility Wound Plan: see above  Wound Therapy Goals- Improve the function of patient's integumentary system by progressing the wound(s) through the phases of wound healing (inflammation - proliferation - remodeling) by: Decrease Necrotic Tissue to: 40 Decrease Necrotic Tissue - Progress: Not progressing Increase Granulation Tissue to: 60 Increase Granulation Tissue - Progress: Mot progressing  Goals will be updated until maximal potential achieved or discharge criteria met.  Discharge criteria: when goals achieved, discharge from hospital, MD decision/surgical intervention, no progress towards goals, refusal/missing three consecutive treatments without notification or medical reason.  GP  [name], PT, DPT Acute Rehabilitation Services Pager [phone] Office [phone]      [name] 02/18/2019, 12:07 PM

## 2019-02-18 NOTE — Progress Notes (Signed)
OT Cancellation Note  Patient Details Name: Antonio [name]. MRN: [id number] DOB: 10/05/1956   Cancelled Treatment:    Reason Eval/Treat Not Completed: Pain limiting ability to participate; pt reports increased pain levels at this time, RN present to administer pain meds and change dressing. Pt request OT return at a later time. Will follow.  Marcy Siren, OT Supplemental Rehabilitation Services Pager [phone] Office [phone]   Orlando Penner 02/18/2019, 2:34 PM

## 2019-02-18 NOTE — Progress Notes (Signed)
PT Cancellation Note  Patient Details Name: Antonio [name]. MRN: [id number] DOB: 28-Feb-1956   Cancelled Treatment:    Reason Eval/Treat Not Completed: Patient declined, no reason specified;Other (comment)(due to increase pain.)   02/18/2019  Saco Bing, PT Acute Rehabilitation Services [phone]  (pager) [phone]  (office)   Antonio [name] 02/18/2019, 2:54 PM

## 2019-02-18 NOTE — Progress Notes (Signed)
PROGRESS NOTE        PATIENT DETAILS Name: Norvel RichardsDaniel Watson Yawn Jr. Age: 63 y.o. Sex: male Date of Birth: 1956-08-11 Admit Date: 11/28/2018 Admitting Physician Reyes IvanWael Z Aljishi, MD RUE:AVWUPCP:York, Orie Routegina F, NP  Brief Narrative: Patient is a 63 y.o. male admitted on 1/3 with influenza A causing acute hypoxic respiratory failure, cardiogenic shock-required prolonged mechanical ventilation-is currently s/p tracheostomy and PEG tube placement.  Significant events: 1/03  Admit, Influenza A positive 1/06  Self extubated  1/07  Re-intubated  1/08  Self-extubated  1/16 tracheostomy 1/21  Trach revision to XLT distal 1/28 PEG tube 2/03  Weaned 15 minutes  2/11  ATC  2/14  Trach changed to #4, mucus plugging >back to #5 XLT 2/15  Failed ATC with increased WOB 2/16  Failed ATC with increased WOB  2/21 back on pressure support 2/24 back on TC  01/27/19 - 5 XLT cuffless trach  01/29/2019: Size 4 cuff less tracheostomy placed 01/30/2019: Capping trials initiated 02/02/2019: Tracheostomy decannulated  Subjective: No major issues overnight-no chest pain or shortness of breath.  Lying comfortably in bed.  Awaiting SNF placement  Assessment/Plan: Fever: Afebrile over the past few days-chest x-ray without any infiltrate, blood culture and urine culture on 3/11 negative.  Currently off all antimicrobial therapy-continue to monitor.  Stage IV sacral/coccygeal ulceration: Continue with hydrotherapy while inpatient-general surgery and wound care following.  Recent MRI lumbar spine negative for osteomyelitis.  Defer further to general surgery.  Acute hypoxic respiratory failure secondary to influenza A, pseudomonal/Corynebacterium pneumonia: Prolonged hospitalization requiring prolonged ventilator support-subsequently tracheostomy on 1/16.  Currently decannulated on 3/9-Per last PCCM note-if the wound does not close in 2 weeks-may need evaluation for tracheocutaneous fistula.  Acute  systolic heart failure (EF 20-25% on 1/4, EF 60-65% by TTE on 1/29): Compensated-continue beta-blocker.  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  Cardiology recommending further work-up to be done in the outpatient setting.  Dysphagia: Speech therapy following-recommendations were for a dysphagia 2 diet-however patient noncompliant-and as a result SLP has upgraded to a regular diet.  No signs of aspiration at this point.  If continues to tolerate diet well-suspect we can contemplate removing PEG tube in the next few days.  Metabolic encephalopathy: Resolved.  Etiology felt to be secondary to hypoxia/pneumonia.  DM-2: Mild hypoglycemic episode this morning-decrease Levemir to 20 units twice daily-continue SSI and follow.   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  Chronic pain syndrome: Continue Nucynta and as needed oxycodone  Debility/deconditioning: Secondary to acute illness-awaiting SNF bed  DVT Prophylaxis: Prophylactic Lovenox   Code Status: Full code  Family Communication: None at bedside  Disposition Plan: Remain inpatient-SNF on discharge  Antimicrobial agents: Anti-infectives (From admission, onward)   Start     Dose/Rate Route Frequency Ordered Stop   02/04/19 1300  vancomycin (VANCOCIN) 1,250 mg in sodium chloride 0.9 % 250 mL IVPB  Status:  Discontinued     1,250 mg 166.7 mL/hr over 90 Minutes Intravenous Every 12 hours 02/04/19 1200 02/06/19 1002   02/04/19 1200  metroNIDAZOLE (FLAGYL) IVPB 500 mg  Status:  Discontinued     500 mg 100 mL/hr over 60 Minutes Intravenous Every 8 hours 02/04/19 1146 02/06/19 1002   02/04/19 0930  ceFEPIme (MAXIPIME) 2 g in sodium chloride 0.9 % 100 mL IVPB     2  g 200 mL/hr over 30 Minutes Intravenous Every 8 hours 02/04/19 0926 02/08/19 2359   02/03/19 1200  meropenem (MERREM) 1 g in sodium chloride 0.9 % 100 mL IVPB  Status:  Discontinued     1 g 200 mL/hr over 30 Minutes  Intravenous Every 8 hours 02/03/19 1054 02/04/19 0924   01/19/19 1400  cefTAZidime (FORTAZ) 2 g in sodium chloride 0.9 % 100 mL IVPB     2 g 200 mL/hr over 30 Minutes Intravenous Every 8 hours 01/19/19 1120 01/24/19 2359   01/15/19 1500  cefTAZidime (FORTAZ) 2 g in sodium chloride 0.9 % 100 mL IVPB     2 g 200 mL/hr over 30 Minutes Intravenous Every 8 hours 01/15/19 1448 01/19/19 0542   01/14/19 1400  colistimethate ((COLYMYCIN-M)) nebulized solution 150 mg  Status:  Discontinued     150 mg Inhalation 3 times daily 01/14/19 1142 01/19/19 1119   01/14/19 1300  meropenem (MERREM) 1 g in sodium chloride 0.9 % 100 mL IVPB  Status:  Discontinued     1 g 200 mL/hr over 30 Minutes Intravenous Every 8 hours 01/14/19 1235 01/15/19 1448   12/23/18 1506  vancomycin (VANCOCIN) 1-5 GM/200ML-% IVPB    Note to Pharmacy:  [facility]   : cabinet override      12/23/18 1506 12/23/18 1515   12/20/18 2200  vancomycin (VANCOCIN) IVPB 1000 mg/200 mL premix  Status:  Discontinued     1,000 mg 200 mL/hr over 60 Minutes Intravenous Every 12 hours 12/20/18 1804 12/22/18 1413   12/18/18 0800  ceFEPIme (MAXIPIME) 1 g in sodium chloride 0.9 % 100 mL IVPB  Status:  Discontinued     1 g 200 mL/hr over 30 Minutes Intravenous Every 8 hours 12/18/18 0713 12/19/18 1201   12/17/18 0530  vancomycin (VANCOCIN) 1,500 mg in sodium chloride 0.9 % 500 mL IVPB  Status:  Discontinued     1,500 mg 250 mL/hr over 120 Minutes Intravenous Every 12 hours 12/16/18 1704 12/20/18 1804   12/16/18 1730  cefTAZidime (FORTAZ) 2 g in sodium chloride 0.9 % 100 mL IVPB  Status:  Discontinued     2 g 200 mL/hr over 30 Minutes Intravenous Every 8 hours 12/16/18 1704 12/18/18 0702   12/16/18 1730  vancomycin (VANCOCIN) 2,000 mg in sodium chloride 0.9 % 500 mL IVPB     2,000 mg 250 mL/hr over 120 Minutes Intravenous  Once 12/16/18 1704 12/16/18 2030   12/07/18 1300  erythromycin 500 mg in sodium chloride 0.9 % 100 mL IVPB  Status:   Discontinued     500 mg 100 mL/hr over 60 Minutes Intravenous Every 6 hours 12/07/18 1118 12/16/18 1627   12/06/18 1500  vancomycin (VANCOCIN) 1,250 mg in sodium chloride 0.9 % 250 mL IVPB  Status:  Discontinued     1,250 mg 166.7 mL/hr over 90 Minutes Intravenous Every 12 hours 12/06/18 1436 12/07/18 1118   12/06/18 0100  vancomycin (VANCOCIN) 1,250 mg in sodium chloride 0.9 % 250 mL IVPB     1,250 mg 166.7 mL/hr over 90 Minutes Intravenous  Once 12/05/18 1054 12/06/18 0146   12/05/18 1200  vancomycin (VANCOCIN) 2,000 mg in sodium chloride 0.9 % 500 mL IVPB     2,000 mg 250 mL/hr over 120 Minutes Intravenous  Once 12/05/18 1054 12/05/18 1350   12/05/18 1200  cefTAZidime (FORTAZ) 1 g in sodium chloride 0.9 % 100 mL IVPB  Status:  Discontinued     1 g 200 mL/hr over 30  Minutes Intravenous Every 8 hours 12/05/18 1106 12/07/18 1118   11/30/18 2200  oseltamivir (TAMIFLU) 6 MG/ML suspension 30 mg     30 mg Per Tube 2 times daily 11/30/18 1348 12/03/18 2159   11/30/18 2200  ceFEPIme (MAXIPIME) 1 g in sodium chloride 0.9 % 100 mL IVPB  Status:  Discontinued     1 g 200 mL/hr over 30 Minutes Intravenous Every 12 hours 11/30/18 1350 12/01/18 1039   11/29/18 2200  ceFEPIme (MAXIPIME) 2 g in sodium chloride 0.9 % 100 mL IVPB  Status:  Discontinued     2 g 200 mL/hr over 30 Minutes Intravenous Every 12 hours 11/29/18 1645 11/30/18 1350   11/29/18 1100  oseltamivir (TAMIFLU) 6 MG/ML suspension 75 mg  Status:  Discontinued     75 mg Per Tube 2 times daily 11/29/18 1000 11/30/18 1348   11/28/18 2300  oseltamivir (TAMIFLU) capsule 75 mg  Status:  Discontinued     75 mg Oral 2 times daily 11/28/18 2238 11/29/18 1000   11/28/18 2030  oseltamivir (TAMIFLU) capsule 75 mg     75 mg Oral  Once 11/28/18 2029 11/28/18 2207   11/28/18 1745  vancomycin (VANCOCIN) 2,000 mg in sodium chloride 0.9 % 500 mL IVPB     2,000 mg 250 mL/hr over 120 Minutes Intravenous  Once 11/28/18 1741 11/28/18 2203   11/28/18 1745   piperacillin-tazobactam (ZOSYN) IVPB 3.375 g     3.375 g 100 mL/hr over 30 Minutes Intravenous  Once 11/28/18 1741 11/28/18 1834      Procedures: See above  CONSULTS:  cardiology, pulmonary/intensive care and general surgery  Time spent: 15 minutes-Greater than 50% of this time was spent in counseling, explanation of diagnosis, planning of further management, and coordination of care.  MEDICATIONS: Scheduled Meds:  arformoterol  15 mcg Nebulization BID   aspirin  81 mg Per Tube Daily   atorvastatin  40 mg Per Tube q1800   budesonide (PULMICORT) nebulizer solution  0.5 mg Nebulization BID   carvedilol  3.125 mg Per Tube BID WC   collagenase   Topical Daily   enoxaparin (LOVENOX) injection  40 mg Subcutaneous Q24H   famotidine  20 mg Per Tube BID   feeding supplement (PRO-STAT SUGAR FREE 64)  30 mL Oral BID   folic acid  1 mg Per Tube Daily   guaiFENesin  5 mL Per Tube Q12H   insulin aspart  0-20 Units Subcutaneous TID WC   insulin detemir  25 Units Subcutaneous BID   multivitamin with minerals  1 tablet Oral Daily   polyethylene glycol  17 g Per Tube Daily   tapentadol  50 mg Oral QID   thiamine  100 mg Per Tube Daily   Continuous Infusions:  sodium chloride 10 mL/hr at 01/24/19 2000   PRN Meds:.sodium chloride, acetaminophen (TYLENOL) oral liquid 160 mg/5 mL, albuterol, alum & mag hydroxide-simeth **AND** lidocaine, bisacodyl, clonazePAM, docusate, ipratropium-albuterol, oxyCODONE, Resource ThickenUp Clear, senna   PHYSICAL EXAM: Vital signs: Vitals:   02/18/19 0631 02/18/19 0829 02/18/19 0832 02/18/19 0917  BP: 108/72   (!) 142/71  Pulse: 75   80  Resp: 18   18  Temp: 97.8 F (36.6 C)     TempSrc: Oral     SpO2: 95% 93% 93% 94%  Weight:      Height:       Filed Weights   02/11/19 0547 02/12/19 0542 02/13/19 0436  Weight: 81.2 kg 87 kg 63 kg   Body  mass index is 18.84 kg/m.   General appearance :Awake, alert, not in any distress.    HEENT: Atraumatic and Normocephalic Resp:Good air entry bilaterally, no added sounds  CVS: S1 S2 regular, no murmurs.  GI: Bowel sounds present, Non tender and not distended with no gaurding, rigidity or rebound.No organomegaly Extremities: Left AKA Neurology: Nonfocal Musculoskeletal:No digital cyanosis Skin:No Rash, warm and dry  I have personally reviewed following labs and imaging studies  LABORATORY DATA: CBC: Recent Labs  Lab 02/15/19 0455  WBC 10.1  HGB 11.3*  HCT 36.2*  MCV 86.2  PLT 264    Basic Metabolic Panel: Recent Labs  Lab 02/15/19 0455  NA 136  K 4.0  CL 97*  CO2 30  GLUCOSE 130*  BUN 25*  CREATININE 0.59*  CALCIUM 9.1    GFR: Estimated Creatinine Clearance: 86.4 mL/min (A) (by C-G formula based on SCr of 0.59 mg/dL (L)).  Liver Function Tests: No results for input(s): AST, ALT, ALKPHOS, BILITOT, PROT, ALBUMIN in the last 168 hours. No results for input(s): LIPASE, AMYLASE in the last 168 hours. No results for input(s): AMMONIA in the last 168 hours.  Coagulation Profile: No results for input(s): INR, PROTIME in the last 168 hours.  Cardiac Enzymes: No results for input(s): CKTOTAL, CKMB, CKMBINDEX, TROPONINI in the last 168 hours.  BNP (last 3 results) No results for input(s): PROBNP in the last 8760 hours.  HbA1C: No results for input(s): HGBA1C in the last 72 hours.  CBG: Recent Labs  Lab 02/17/19 1147 02/17/19 1730 02/17/19 2212 02/18/19 0738 02/18/19 0825  GLUCAP 120* 122* 124* 60* 104*    Lipid Profile: No results for input(s): CHOL, HDL, LDLCALC, TRIG, CHOLHDL, LDLDIRECT in the last 72 hours.  Thyroid Function Tests: No results for input(s): TSH, T4TOTAL, FREET4, T3FREE, THYROIDAB in the last 72 hours.  Anemia Panel: No results for input(s): VITAMINB12, FOLATE, FERRITIN, TIBC, IRON, RETICCTPCT in the last 72 hours.  Urine analysis:    Component Value Date/Time   COLORURINE YELLOW 02/04/2019 1400   APPEARANCEUR  HAZY (A) 02/04/2019 1400   LABSPEC 1.028 02/04/2019 1400   PHURINE 5.0 02/04/2019 1400   GLUCOSEU 150 (A) 02/04/2019 1400   HGBUR NEGATIVE 02/04/2019 1400   BILIRUBINUR NEGATIVE 02/04/2019 1400   KETONESUR NEGATIVE 02/04/2019 1400   PROTEINUR 100 (A) 02/04/2019 1400   NITRITE NEGATIVE 02/04/2019 1400   LEUKOCYTESUR NEGATIVE 02/04/2019 1400    Sepsis Labs: Lactic Acid, Venous    Component Value Date/Time   LATICACIDVEN 1.3 01/09/2019 0620    MICROBIOLOGY: No results found for this or any previous visit (from the past 240 hour(s)).  RADIOLOGY STUDIES/RESULTS: Mr Lumbar Spine W Wo Contrast  Result Date: 02/04/2019 CLINICAL DATA:  Coccygeal wound infection. EXAM: MRI LUMBAR SPINE WITHOUT AND WITH CONTRAST TECHNIQUE: Multiplanar and multiecho pulse sequences of the lumbar spine were obtained without and with intravenous contrast. CONTRAST:  7 mL Gadavist COMPARISON:  None. FINDINGS: Segmentation: Normal. The lowest disc space is considered to be L5-S1. Alignment:  Normal Vertebrae: There is no bone marrow edema. No compression fracture. No abnormal contrast enhancement. Normal T1-weighted signal is preserved throughout the visualized spine and sacrum. The coccyx is outside of the field of view. Conus medullaris and cauda equina: The conus medullaris terminates at the L1 level. The cauda equina and conus medullaris are both normal. Paraspinal and other soft tissues: The visualized retroperitoneal organs and paraspinal soft tissues are normal. Disc levels: Sagittal plane imaging includes the T12-L1 disc level through the  upper sacrum, with axial imaging of the L1-2 to L5-S1 disc levels. T12-L1: Normal. L1-2: Normal. L2-3: Disc desiccation with small bulge and superimposed central protrusion. No central spinal canal stenosis. No neural foraminal stenosis. Normal facets. L3-4: Disc desiccation and small central protrusion. No spinal canal or neural foraminal stenosis. L4-5: Minimal disc bulge no  spinal canal or neural foraminal stenosis. L5-S1: Central/right subarticular disc protrusion with narrowing of the right lateral recess. No central spinal canal stenosis or neural foraminal stenosis. The visualized portion of the sacrum is normal. IMPRESSION: 1. No evidence of active osteomyelitis of the lumbar spine or sacrum. Please note that the coccyx is outside the field of view. 2. No fluid collection or other focal abnormality of the soft tissues dorsal to the spine. 3. Multilevel mild degenerative disc disease. Mild narrowing of the right L5-S1 lateral recess could cause radicular symptoms in a right S1 distribution. Electronically Signed   By: Deatra Robinson M.D.   On: 02/04/2019 23:06   Dg Chest Port 1 View  Result Date: 02/04/2019 CLINICAL DATA:  Dyspnea. EXAM: PORTABLE CHEST 1 VIEW COMPARISON:  One-view chest x-ray 02/03/2019 FINDINGS: The heart is mildly enlarged. Mild pulmonary vascular congestion is stable. Elevation of the right hemidiaphragm is again noted. There is some volume loss on the right with associated atelectasis. No significant airspace consolidation is present. A small right effusion is not excluded. IMPRESSION: 1. Mild pulmonary vascular congestion without frank edema. 2. Similar appearance of right basilar atelectasis. Electronically Signed   By: Marin Roberts M.D.   On: 02/04/2019 08:31   Dg Chest Port 1 View  Result Date: 02/03/2019 CLINICAL DATA:  Shortness of breath EXAM: PORTABLE CHEST 1 VIEW COMPARISON:  01/19/2019 FINDINGS: Cardiac shadows within normal limits. Elevation of the right hemidiaphragm is noted with right basilar atelectasis. No other focal infiltrate is seen. Gastrostomy tube is noted within the stomach. IMPRESSION: Mild right basilar atelectasis. Electronically Signed   By: Alcide Clever M.D.   On: 02/03/2019 08:24   Dg Swallowing Func-speech Pathology  Result Date: 02/04/2019 Objective Swallowing Evaluation: Type of Study: MBS-Modified Barium  Swallow Study  Patient Details Name: Draken Farrior. MRN: [id number] Date of Birth: Apr 24, 1956 Today's Date: 02/04/2019 Time: SLP Start Time (ACUTE ONLY): 0920 -SLP Stop Time (ACUTE ONLY): 0931 SLP Time Calculation (min) (ACUTE ONLY): 11 min Past Medical History: Past Medical History: Diagnosis Date  Coronary artery disease   Diabetes mellitus without complication (HCC)   Hyperlipidemia   Hypertension   Osteomyelitis (HCC) 03/25/2017  RT FOOT Past Surgical History: Past Surgical History: Procedure Laterality Date  AMPUTATION Right 03/27/2017  Procedure: 1st Ray Amputation Right Foot;  Surgeon: Nadara Mustard, MD;  Location: [facility] OR;  Service: Orthopedics;  Laterality: Right;  APPENDECTOMY    BELOW KNEE LEG AMPUTATION Left   CARDIAC CATHETERIZATION    CORONARY STENT INTERVENTION  2005  Great toe amputation right.    I&D EXTREMITY Left 12/30/2015  Procedure: IRRIGATION AND DEBRIDEMENT LEFT FOOT, TRANSMETATARSAL AMPUTATION WITH APPLICATION OF ANTIBIOTIC BEADS AND WOUND VAC;  Surgeon: Nadara Mustard, MD;  Location: [facility] OR;  Service: Orthopedics;  Laterality: Left;  IR GASTROSTOMY TUBE MOD SED  12/23/2018  TONSILLECTOMY    TRACHEOSTOMY  11/2018 HPI: 63 year old male coming in with fever cough chest pain and shortness of breath was found to have influenza A. PMH: diabetes mellitus, hypertension.  Intubated multiple times 1/3 to 1/16 with trach placed 1/16. Repeat MBS on 3/28 for possible initiation of po's. This is  pt's 3rd MBS. Plan to repeat to determine upgrade initially; on 3/10 concern pt may have aspirated and made NPO.   Subjective: pt is alert and cooperative but needs encouragement Assessment / Plan / Recommendation CHL IP CLINICAL IMPRESSIONS 02/04/2019 Clinical Impression Pt has been decannulated since 3/9 with evidence of incomplete stoma closure indicated by breathy vocal quality. He continues to exhibit moderate-severe pharyngeal dysphagia noteably due to incompetent laryngeal closure and poor  retroflexion of epiglottis. Honey thick penetrated vestibule with initial swallow of MBS and expelled with cues for cough and was not penetrated with subsequent trial. Nectar thick entered vestibule with penetration aspiration score (PAS) of 3, 5 & 7 (did not exit trachea with cough). Chin tuck actually facilitated entrance of barium into trachea and swallow with head in neutral applying pressure to gauze/stoma to increase pressure was ineffective. Mild-mod amount of vallecular and pyriform sinus residue not significantly improved. Recommend pt resume po's of honey thick liquids, texture of Dys 2, multiple swallows and hard coughs/throat clear after every other bite/sip. Pt should continue to receive ST at skilled nursing facility for continued intervention and repeat MBS when appropriate (not before 6-8 weeks). Will continue to follow while here.  SLP Visit Diagnosis Dysphagia, pharyngeal phase (R13.13) Attention and concentration deficit following -- Frontal lobe and executive function deficit following -- Impact on safety and function Severe aspiration risk   CHL IP TREATMENT RECOMMENDATION 02/04/2019 Treatment Recommendations Therapy as outlined in treatment plan below   Prognosis 02/04/2019 Prognosis for Safe Diet Advancement Good Barriers to Reach Goals -- Barriers/Prognosis Comment -- CHL IP DIET RECOMMENDATION 02/04/2019 SLP Diet Recommendations Dysphagia 2 (Fine chop) solids;Honey thick liquids Liquid Administration via Cup;No straw Medication Administration Whole meds with puree Compensations Slow rate;Small sips/bites;Multiple dry swallows after each bite/sip;Hard cough after swallow;Clear throat intermittently Postural Changes Seated upright at 90 degrees;Remain semi-upright after after feeds/meals (Comment)   CHL IP OTHER RECOMMENDATIONS 02/04/2019 Recommended Consults -- Oral Care Recommendations Oral care BID Other Recommendations --   CHL IP FOLLOW UP RECOMMENDATIONS 02/04/2019 Follow up Recommendations  Skilled Nursing facility   [facility] IP FREQUENCY AND DURATION 02/04/2019 Speech Therapy Frequency (ACUTE ONLY) min 2x/week Treatment Duration 2 weeks      CHL IP ORAL PHASE 02/04/2019 Oral Phase WFL Oral - Pudding Teaspoon -- Oral - Pudding Cup -- Oral - Honey Teaspoon -- Oral - Honey Cup WFL Oral - Nectar Teaspoon -- Oral - Nectar Cup WFL Oral - Nectar Straw -- Oral - Thin Teaspoon -- Oral - Thin Cup NT Oral - Thin Straw -- Oral - Puree NT Oral - Mech Soft -- Oral - Regular NT Oral - Multi-Consistency -- Oral - Pill -- Oral Phase - Comment --  CHL IP PHARYNGEAL PHASE 02/04/2019 Pharyngeal Phase Impaired Pharyngeal- Pudding Teaspoon -- Pharyngeal -- Pharyngeal- Pudding Cup -- Pharyngeal -- Pharyngeal- Honey Teaspoon NT Pharyngeal -- Pharyngeal- Honey Cup Penetration/Aspiration during swallow;Pharyngeal residue - valleculae;Pharyngeal residue - pyriform;Reduced airway/laryngeal closure Pharyngeal Material enters airway, remains ABOVE vocal cords and not ejected out Pharyngeal- Nectar Teaspoon NT Pharyngeal -- Pharyngeal- Nectar Cup Penetration/Aspiration during swallow;Reduced airway/laryngeal closure;Pharyngeal residue - valleculae;Pharyngeal residue - pyriform Pharyngeal Material enters airway, passes BELOW cords and not ejected out despite cough attempt by patient;Material enters airway, CONTACTS cords and not ejected out Pharyngeal- Nectar Straw -- Pharyngeal -- Pharyngeal- Thin Teaspoon NT Pharyngeal -- Pharyngeal- Thin Cup NT Pharyngeal -- Pharyngeal- Thin Straw -- Pharyngeal -- Pharyngeal- Puree NT Pharyngeal -- Pharyngeal- Mechanical Soft -- Pharyngeal -- Pharyngeal- Regular NT Pharyngeal --  Pharyngeal- Multi-consistency -- Pharyngeal -- Pharyngeal- Pill -- Pharyngeal -- Pharyngeal Comment --  CHL IP CERVICAL ESOPHAGEAL PHASE 02/04/2019 Cervical Esophageal Phase WFL Pudding Teaspoon -- Pudding Cup -- Honey Teaspoon -- Honey Cup -- Nectar Teaspoon -- Nectar Cup -- Nectar Straw -- Thin Teaspoon -- Thin Cup -- Thin Straw  -- Puree -- Mechanical Soft -- Regular -- Multi-consistency -- Pill -- Cervical Esophageal Comment -- Royce Macadamia 02/04/2019, 10:23 AM Breck Coons [employer] administrator Pager [phone] Office [phone]              Dg Swallowing Func-speech Pathology  Result Date: 01/30/2019 Objective Swallowing Evaluation: Type of Study: MBS-Modified Barium Swallow Study  Patient Details Name: Willis Irwin. MRN: [id number] Date of Birth: [birth year] Today's Date: 01/30/2019 Time: SLP Start Time (ACUTE ONLY): 1328 -SLP Stop Time (ACUTE ONLY): 1348 SLP Time Calculation (min) (ACUTE ONLY): 20 min Past Medical History: Past Medical History: Diagnosis Date  Coronary artery disease   Diabetes mellitus without complication (HCC)   Hyperlipidemia   Hypertension   Osteomyelitis (HCC) 03/25/2017  RT FOOT Past Surgical History: Past Surgical History: Procedure Laterality Date  AMPUTATION Right 03/27/2017  Procedure: 1st Ray Amputation Right Foot;  Surgeon: Nadara Mustard, MD;  Location: [facility] OR;  Service: Orthopedics;  Laterality: Right;  APPENDECTOMY    BELOW KNEE LEG AMPUTATION Left   CARDIAC CATHETERIZATION    CORONARY STENT INTERVENTION  2005  Great toe amputation right.    I&D EXTREMITY Left 12/30/2015  Procedure: IRRIGATION AND DEBRIDEMENT LEFT FOOT, TRANSMETATARSAL AMPUTATION WITH APPLICATION OF ANTIBIOTIC BEADS AND WOUND VAC;  Surgeon: Nadara Mustard, MD;  Location: [facility] OR;  Service: Orthopedics;  Laterality: Left;  IR GASTROSTOMY TUBE MOD SED  12/23/2018  TONSILLECTOMY    TRACHEOSTOMY  11/2018 HPI: 64 year old male coming in with fever cough chest pain and shortness of breath was found to have influenza A. PMH: diabetes mellitus, hypertension.  Intubated multiple times 1/3 to 1/16 with trach placed 1/16. Repeat MBS on 3/28 for possible initiation of po's.  Subjective: pt is alert and cooperative but needs encouragement Assessment / Plan / Recommendation CHL IP CLINICAL IMPRESSIONS 01/30/2019  Clinical Impression Pt's trach was capped this morning and during MBS with normal oropharyngeal pressures. Swallow function has improved from prior study despite silent aspiration with thin and penetration of nectar and honey. Laryngeal excursion, pharyngeal contraction and epiglottic inversion are not of normal strength and ROM however improved from last week. Incomplete closure led to penetration with thin, nectar and only flash with honey thick x 1. Chin tuck did not prevent penetration (silent) with nectar and vallecular and pyriform sinus residue present and was mildly increased with honey thick. Verbal cues to perform second swallow reduced volume. Puree was not penetrated this study. Recommend Dys 2 given mild oral residue, impulsivity and deconditioning. Honey thick liquids recommended with two swallows, intermittent throat clears and pills whole in applesauce. ST will continue to follow for safety and upgrade of po textures. SLP Visit Diagnosis Dysphagia, oropharyngeal phase (R13.12) Attention and concentration deficit following -- Frontal lobe and executive function deficit following -- Impact on safety and function Moderate aspiration risk;Severe aspiration risk   CHL IP TREATMENT RECOMMENDATION 01/30/2019 Treatment Recommendations Therapy as outlined in treatment plan below   Prognosis 01/30/2019 Prognosis for Safe Diet Advancement Good Barriers to Reach Goals -- Barriers/Prognosis Comment -- CHL IP DIET RECOMMENDATION 01/30/2019 SLP Diet Recommendations Honey thick liquids;Dysphagia 2 (Fine chop) solids Liquid Administration via Cup;No straw  Medication Administration Whole meds with puree Compensations Slow rate;Small sips/bites;Multiple dry swallows after each bite/sip;Clear throat intermittently Postural Changes Seated upright at 90 degrees   CHL IP OTHER RECOMMENDATIONS 01/30/2019 Recommended Consults -- Oral Care Recommendations Oral care BID Other Recommendations --   CHL IP FOLLOW UP RECOMMENDATIONS  01/30/2019 Follow up Recommendations (No Data)   CHL IP FREQUENCY AND DURATION 01/30/2019 Speech Therapy Frequency (ACUTE ONLY) min 2x/week Treatment Duration 2 weeks      CHL IP ORAL PHASE 01/30/2019 Oral Phase Impaired Oral - Pudding Teaspoon -- Oral - Pudding Cup -- Oral - Honey Teaspoon -- Oral - Honey Cup Lingual/palatal residue Oral - Nectar Teaspoon -- Oral - Nectar Cup Lingual/palatal residue Oral - Nectar Straw -- Oral - Thin Teaspoon -- Oral - Thin Cup -- Oral - Thin Straw -- Oral - Puree Lingual/palatal residue Oral - Mech Soft -- Oral - Regular WFL Oral - Multi-Consistency -- Oral - Pill -- Oral Phase - Comment --  CHL IP PHARYNGEAL PHASE 01/30/2019 Pharyngeal Phase Impaired Pharyngeal- Pudding Teaspoon -- Pharyngeal -- Pharyngeal- Pudding Cup -- Pharyngeal -- Pharyngeal- Honey Teaspoon NT Pharyngeal -- Pharyngeal- Honey Cup Pharyngeal residue - valleculae;Pharyngeal residue - pyriform;Reduced epiglottic inversion;Penetration/Aspiration during swallow;Reduced airway/laryngeal closure Pharyngeal Material enters airway, remains ABOVE vocal cords then ejected out Pharyngeal- Nectar Teaspoon NT Pharyngeal -- Pharyngeal- Nectar Cup Penetration/Aspiration during swallow;Reduced airway/laryngeal closure;Reduced epiglottic inversion;Pharyngeal residue - valleculae;Pharyngeal residue - pyriform;Compensatory strategies attempted (with notebox) Pharyngeal Material enters airway, remains ABOVE vocal cords and not ejected out Pharyngeal- Nectar Straw -- Pharyngeal -- Pharyngeal- Thin Teaspoon NT Pharyngeal -- Pharyngeal- Thin Cup Penetration/Aspiration during swallow;Pharyngeal residue - valleculae;Pharyngeal residue - pyriform;Moderate aspiration;Trace aspiration;Reduced airway/laryngeal closure;Reduced epiglottic inversion Pharyngeal Material enters airway, passes BELOW cords without attempt by patient to eject out (silent aspiration) Pharyngeal- Thin Straw -- Pharyngeal -- Pharyngeal- Puree Pharyngeal residue -  pyriform;Pharyngeal residue - valleculae;Reduced epiglottic inversion Pharyngeal Material does not enter airway Pharyngeal- Mechanical Soft -- Pharyngeal -- Pharyngeal- Regular WFL Pharyngeal -- Pharyngeal- Multi-consistency -- Pharyngeal -- Pharyngeal- Pill -- Pharyngeal -- Pharyngeal Comment --  CHL IP CERVICAL ESOPHAGEAL PHASE 01/30/2019 Cervical Esophageal Phase WFL Pudding Teaspoon -- Pudding Cup -- Honey Teaspoon -- Honey Cup -- Nectar Teaspoon -- Nectar Cup -- Nectar Straw -- Thin Teaspoon -- Thin Cup -- Thin Straw -- Puree -- Mechanical Soft -- Regular -- Multi-consistency -- Pill -- Cervical Esophageal Comment -- Royce Macadamia 01/30/2019, 2:53 PM Breck Coons Litaker M.Ed Sports administrator Pager [phone] Office [phone]              Dg Swallowing Func-speech Pathology  Result Date: 01/23/2019 Objective Swallowing Evaluation: Type of Study: MBS-Modified Barium Swallow Study  Patient Details Name: Eliyah Bazzi. MRN: [id number] Date of Birth: 02/15/1956 Today's Date: 01/23/2019 Time: SLP Start Time (ACUTE ONLY): 1254 -SLP Stop Time (ACUTE ONLY): 1308 SLP Time Calculation (min) (ACUTE ONLY): 14 min Past Medical History: Past Medical History: Diagnosis Date  Coronary artery disease   Diabetes mellitus without complication (HCC)   Hyperlipidemia   Hypertension   Osteomyelitis (HCC) 03/25/2017  RT FOOT Past Surgical History: Past Surgical History: Procedure Laterality Date  AMPUTATION Right 03/27/2017  Procedure: 1st Ray Amputation Right Foot;  Surgeon: Nadara Mustard, MD;  Location: [facility] OR;  Service: Orthopedics;  Laterality: Right;  APPENDECTOMY    BELOW KNEE LEG AMPUTATION Left   CARDIAC CATHETERIZATION    CORONARY STENT INTERVENTION  2005  Great toe amputation right.    I&D EXTREMITY Left 12/30/2015  Procedure: IRRIGATION AND DEBRIDEMENT LEFT FOOT, TRANSMETATARSAL AMPUTATION WITH APPLICATION OF ANTIBIOTIC BEADS AND WOUND VAC;  Surgeon: Nadara Mustard, MD;  Location: [facility] OR;   Service: Orthopedics;  Laterality: Left;  IR GASTROSTOMY TUBE MOD SED  12/23/2018  TONSILLECTOMY    TRACHEOSTOMY  11/2018 HPI: 63 year old male coming in with fever cough chest pain and shortness of breath was found to have influenza A. PMH: diabetes mellitus, hypertension.  Intubated multiple times 1/3 to 1/16 with trach placed 1/16.   Subjective: pt is alert and cooperative but needs encouragement Assessment / Plan / Recommendation CHL IP CLINICAL IMPRESSIONS 01/23/2019 Clinical Impression Pt has a moderate pharyngeal dysphagia due to reduced strength and airway closure s/p prolonged (and repeated) intubations. His timing for swallow trigger is good, but he has reduced hyolaryngeal movement, pharyngeal squeeze, epiglottic inversion, and tonge base retraction. Thin and nectar thick liquids enter the laryngeal vestibule during the swallow because of his incomplete closure, with penetration intermittently reaching the true vocal folds without spontaneous cough elicited. Multiple cued coughs were required to eject penetrates. Honey thick liquids and purees left more residue in the valleculae and pyriform sinuses, with intermittent penetration after the swallow from the residual material. Recommend he remain NPO except for initiating small amounts of purees and spoonfuls of thickened liquids during SLP visits only to facilitate restrengthening. Pt may also benefit from EMST and other exercises to progress strength of cough and pharyngeal musculature.   SLP Visit Diagnosis Dysphagia, pharyngeal phase (R13.13) Attention and concentration deficit following -- Frontal lobe and executive function deficit following -- Impact on safety and function Moderate aspiration risk   CHL IP TREATMENT RECOMMENDATION 01/23/2019 Treatment Recommendations Therapy as outlined in treatment plan below   Prognosis 01/23/2019 Prognosis for Safe Diet Advancement Good Barriers to Reach Goals -- Barriers/Prognosis Comment -- CHL IP DIET  RECOMMENDATION 01/23/2019 SLP Diet Recommendations NPO Liquid Administration via -- Medication Administration Via alternative means Compensations -- Postural Changes --   CHL IP OTHER RECOMMENDATIONS 01/23/2019 Recommended Consults -- Oral Care Recommendations Oral care QID Other Recommendations Have oral suction available   CHL IP FOLLOW UP RECOMMENDATIONS 01/23/2019 Follow up Recommendations Inpatient Rehab   CHL IP FREQUENCY AND DURATION 01/23/2019 Speech Therapy Frequency (ACUTE ONLY) min 2x/week Treatment Duration 2 weeks      CHL IP ORAL PHASE 01/23/2019 Oral Phase WFL Oral - Pudding Teaspoon -- Oral - Pudding Cup -- Oral - Honey Teaspoon -- Oral - Honey Cup -- Oral - Nectar Teaspoon -- Oral - Nectar Cup -- Oral - Nectar Straw -- Oral - Thin Teaspoon -- Oral - Thin Cup -- Oral - Thin Straw -- Oral - Puree -- Oral - Mech Soft -- Oral - Regular -- Oral - Multi-Consistency -- Oral - Pill -- Oral Phase - Comment --  CHL IP PHARYNGEAL PHASE 01/23/2019 Pharyngeal Phase Impaired Pharyngeal- Pudding Teaspoon -- Pharyngeal -- Pharyngeal- Pudding Cup -- Pharyngeal -- Pharyngeal- Honey Teaspoon Reduced pharyngeal peristalsis;Reduced epiglottic inversion;Reduced anterior laryngeal mobility;Reduced laryngeal elevation;Reduced airway/laryngeal closure;Reduced tongue base retraction;Pharyngeal residue - valleculae;Pharyngeal residue - pyriform Pharyngeal -- Pharyngeal- Honey Cup -- Pharyngeal -- Pharyngeal- Nectar Teaspoon Reduced pharyngeal peristalsis;Reduced epiglottic inversion;Reduced anterior laryngeal mobility;Reduced laryngeal elevation;Reduced airway/laryngeal closure;Reduced tongue base retraction;Pharyngeal residue - valleculae;Pharyngeal residue - pyriform;Penetration/Aspiration during swallow Pharyngeal Material enters airway, remains ABOVE vocal cords and not ejected out Pharyngeal- Nectar Cup Reduced pharyngeal peristalsis;Reduced epiglottic inversion;Reduced anterior laryngeal mobility;Reduced laryngeal  elevation;Reduced airway/laryngeal closure;Reduced tongue base retraction;Pharyngeal residue - valleculae;Pharyngeal residue - pyriform;Penetration/Aspiration during swallow Pharyngeal Material enters  airway, remains ABOVE vocal cords and not ejected out Pharyngeal- Nectar Straw -- Pharyngeal -- Pharyngeal- Thin Teaspoon Reduced pharyngeal peristalsis;Reduced epiglottic inversion;Reduced anterior laryngeal mobility;Reduced laryngeal elevation;Reduced airway/laryngeal closure;Reduced tongue base retraction;Penetration/Aspiration during swallow Pharyngeal Material enters airway, CONTACTS cords and not ejected out Pharyngeal- Thin Cup -- Pharyngeal -- Pharyngeal- Thin Straw -- Pharyngeal -- Pharyngeal- Puree Reduced pharyngeal peristalsis;Reduced epiglottic inversion;Reduced anterior laryngeal mobility;Reduced laryngeal elevation;Reduced airway/laryngeal closure;Reduced tongue base retraction;Pharyngeal residue - valleculae;Pharyngeal residue - pyriform;Penetration/Apiration after swallow Pharyngeal Material enters airway, remains ABOVE vocal cords and not ejected out Pharyngeal- Mechanical Soft -- Pharyngeal -- Pharyngeal- Regular -- Pharyngeal -- Pharyngeal- Multi-consistency -- Pharyngeal -- Pharyngeal- Pill -- Pharyngeal -- Pharyngeal Comment --  CHL IP CERVICAL ESOPHAGEAL PHASE 01/23/2019 Cervical Esophageal Phase WFL Pudding Teaspoon -- Pudding Cup -- Honey Teaspoon -- Honey Cup -- Nectar Teaspoon -- Nectar Cup -- Nectar Straw -- Thin Teaspoon -- Thin Cup -- Thin Straw -- Puree -- Mechanical Soft -- Regular -- Multi-consistency -- Pill -- Cervical Esophageal Comment -- Virl Axe Nix 01/23/2019, 2:32 PM  [name], M.A. CCC-SLP Acute Rehabilitation Services Pager [phone] Office [phone]               LOS: 82 days   Jeoffrey Massed, MD  [facility]  If 7PM-7AM, please contact night-coverage  Please page via [email or web address]  Go to [email or web address] and use 's universal password to access.  If you do not have the password, please contact the hospital operator.  Locate the [facility] provider you are looking for under [facility] and page to a number that you can be directly reached. If you still have difficulty reaching the provider, please page the [facility] (Director on Call) for the Hospitalists listed on amion for assistance.  02/18/2019, 9:33 AM

## 2019-02-18 NOTE — Progress Notes (Signed)
[facility] Progress Note     Subjective: CC-  PT in room for hydrotherapy.  Complains of persistent pain from sacral wound. Per RN patient has been refusing several dressing changes.  Objective: Vital signs in last 24 hours: Temp:  [97.8 F (36.6 C)-98.5 F (36.9 C)] 97.8 F (36.6 C) (03/25 0631) Pulse Rate:  [73-83] 80 (03/25 0917) Resp:  [17-24] 18 (03/25 0917) BP: (108-142)/(53-72) 142/71 (03/25 0917) SpO2:  [87 %-95 %] 94 % (03/25 0917) Last BM Date: 02/18/19  Intake/Output from previous day: 03/24 0701 - 03/25 0700 In: 400 [P.O.:400] Out: 565 [Urine:565] Intake/Output this shift: No intake/output data recorded.  PE: Gen: Alert, NAD HEENT: EOM's intact, pupils equal and round Pulm:effort normal GU: See picture below. Sacral wound with significant amount of drainage on dressing that was removed. Some slough remains at proximal edge of wound, but is overall improving. Wound tunnels about4cm at 11 o'clock position, and about 8cm at 8 o'clock position. Surrounding erythema much less with use of antifungal power     Lab Results:  No results for input(s): WBC, HGB, HCT, PLT in the last 72 hours. BMET No results for input(s): NA, K, CL, CO2, GLUCOSE, BUN, CREATININE, CALCIUM in the last 72 hours. PT/INR No results for input(s): LABPROT, INR in the last 72 hours. CMP     Component Value Date/Time   NA 136 02/15/2019 0455   K 4.0 02/15/2019 0455   CL 97 (L) 02/15/2019 0455   CO2 30 02/15/2019 0455   GLUCOSE 130 (H) 02/15/2019 0455   BUN 25 (H) 02/15/2019 0455   CREATININE 0.59 (L) 02/15/2019 0455   CALCIUM 9.1 02/15/2019 0455   PROT 5.6 (L) 02/09/2019 0502   ALBUMIN 3.0 (L) 02/09/2019 0502   AST 19 02/09/2019 0502   ALT 8 02/09/2019 0502   ALKPHOS 108 02/09/2019 0502   BILITOT 1.7 (H) 02/09/2019 0502   GFRNONAA >60 02/15/2019 0455   GFRAA >60 02/15/2019 0455   Lipase     Component Value Date/Time   LIPASE 24 11/30/2018 0244        Studies/Results: No results found.  Anti-infectives: Anti-infectives (From admission, onward)   Start     Dose/Rate Route Frequency Ordered Stop   02/04/19 1300  vancomycin (VANCOCIN) 1,250 mg in sodium chloride 0.9 % 250 mL IVPB  Status:  Discontinued     1,250 mg 166.7 mL/hr over 90 Minutes Intravenous Every 12 hours 02/04/19 1200 02/06/19 1002   02/04/19 1200  metroNIDAZOLE (FLAGYL) IVPB 500 mg  Status:  Discontinued     500 mg 100 mL/hr over 60 Minutes Intravenous Every 8 hours 02/04/19 1146 02/06/19 1002   02/04/19 0930  ceFEPIme (MAXIPIME) 2 g in sodium chloride 0.9 % 100 mL IVPB     2 g 200 mL/hr over 30 Minutes Intravenous Every 8 hours 02/04/19 0926 02/08/19 2359   02/03/19 1200  meropenem (MERREM) 1 g in sodium chloride 0.9 % 100 mL IVPB  Status:  Discontinued     1 g 200 mL/hr over 30 Minutes Intravenous Every 8 hours 02/03/19 1054 02/04/19 0924   01/19/19 1400  cefTAZidime (FORTAZ) 2 g in sodium chloride 0.9 % 100 mL IVPB     2 g 200 mL/hr over 30 Minutes Intravenous Every 8 hours 01/19/19 1120 01/24/19 2359   01/15/19 1500  cefTAZidime (FORTAZ) 2 g in sodium chloride 0.9 % 100 mL IVPB     2 g 200 mL/hr over 30 Minutes Intravenous Every 8 hours 01/15/19  1448 01/19/19 0542   01/14/19 1400  colistimethate ((COLYMYCIN-M)) nebulized solution 150 mg  Status:  Discontinued     150 mg Inhalation 3 times daily 01/14/19 1142 01/19/19 1119   01/14/19 1300  meropenem (MERREM) 1 g in sodium chloride 0.9 % 100 mL IVPB  Status:  Discontinued     1 g 200 mL/hr over 30 Minutes Intravenous Every 8 hours 01/14/19 1235 01/15/19 1448   12/23/18 1506  vancomycin (VANCOCIN) 1-5 GM/200ML-% IVPB    Note to Pharmacy:  [facility]   : cabinet override      12/23/18 1506 12/23/18 1515   12/20/18 2200  vancomycin (VANCOCIN) IVPB 1000 mg/200 mL premix  Status:  Discontinued     1,000 mg 200 mL/hr over 60 Minutes Intravenous Every 12 hours 12/20/18 1804 12/22/18 1413   12/18/18 0800  ceFEPIme  (MAXIPIME) 1 g in sodium chloride 0.9 % 100 mL IVPB  Status:  Discontinued     1 g 200 mL/hr over 30 Minutes Intravenous Every 8 hours 12/18/18 0713 12/19/18 1201   12/17/18 0530  vancomycin (VANCOCIN) 1,500 mg in sodium chloride 0.9 % 500 mL IVPB  Status:  Discontinued     1,500 mg 250 mL/hr over 120 Minutes Intravenous Every 12 hours 12/16/18 1704 12/20/18 1804   12/16/18 1730  cefTAZidime (FORTAZ) 2 g in sodium chloride 0.9 % 100 mL IVPB  Status:  Discontinued     2 g 200 mL/hr over 30 Minutes Intravenous Every 8 hours 12/16/18 1704 12/18/18 0702   12/16/18 1730  vancomycin (VANCOCIN) 2,000 mg in sodium chloride 0.9 % 500 mL IVPB     2,000 mg 250 mL/hr over 120 Minutes Intravenous  Once 12/16/18 1704 12/16/18 2030   12/07/18 1300  erythromycin 500 mg in sodium chloride 0.9 % 100 mL IVPB  Status:  Discontinued     500 mg 100 mL/hr over 60 Minutes Intravenous Every 6 hours 12/07/18 1118 12/16/18 1627   12/06/18 1500  vancomycin (VANCOCIN) 1,250 mg in sodium chloride 0.9 % 250 mL IVPB  Status:  Discontinued     1,250 mg 166.7 mL/hr over 90 Minutes Intravenous Every 12 hours 12/06/18 1436 12/07/18 1118   12/06/18 0100  vancomycin (VANCOCIN) 1,250 mg in sodium chloride 0.9 % 250 mL IVPB     1,250 mg 166.7 mL/hr over 90 Minutes Intravenous  Once 12/05/18 1054 12/06/18 0146   12/05/18 1200  vancomycin (VANCOCIN) 2,000 mg in sodium chloride 0.9 % 500 mL IVPB     2,000 mg 250 mL/hr over 120 Minutes Intravenous  Once 12/05/18 1054 12/05/18 1350   12/05/18 1200  cefTAZidime (FORTAZ) 1 g in sodium chloride 0.9 % 100 mL IVPB  Status:  Discontinued     1 g 200 mL/hr over 30 Minutes Intravenous Every 8 hours 12/05/18 1106 12/07/18 1118   11/30/18 2200  oseltamivir (TAMIFLU) 6 MG/ML suspension 30 mg     30 mg Per Tube 2 times daily 11/30/18 1348 12/03/18 2159   11/30/18 2200  ceFEPIme (MAXIPIME) 1 g in sodium chloride 0.9 % 100 mL IVPB  Status:  Discontinued     1 g 200 mL/hr over 30 Minutes  Intravenous Every 12 hours 11/30/18 1350 12/01/18 1039   11/29/18 2200  ceFEPIme (MAXIPIME) 2 g in sodium chloride 0.9 % 100 mL IVPB  Status:  Discontinued     2 g 200 mL/hr over 30 Minutes Intravenous Every 12 hours 11/29/18 1645 11/30/18 1350   11/29/18 1100  oseltamivir (TAMIFLU)  6 MG/ML suspension 75 mg  Status:  Discontinued     75 mg Per Tube 2 times daily 11/29/18 1000 11/30/18 1348   11/28/18 2300  oseltamivir (TAMIFLU) capsule 75 mg  Status:  Discontinued     75 mg Oral 2 times daily 11/28/18 2238 11/29/18 1000   11/28/18 2030  oseltamivir (TAMIFLU) capsule 75 mg     75 mg Oral  Once 11/28/18 2029 11/28/18 2207   11/28/18 1745  vancomycin (VANCOCIN) 2,000 mg in sodium chloride 0.9 % 500 mL IVPB     2,000 mg 250 mL/hr over 120 Minutes Intravenous  Once 11/28/18 1741 11/28/18 2203   11/28/18 1745  piperacillin-tazobactam (ZOSYN) IVPB 3.375 g     3.375 g 100 mL/hr over 30 Minutes Intravenous  Once 11/28/18 1741 11/28/18 1834       Assessment/Plan HTN CHF DM S/p tx for Influenza A S/p tracheostomy  S/p tx for HCAP Deconditioning  Sacral Wound - MRI negative for osteomyelitisof areas evaluated -hydrotherapy,[place] WTD dressing changes TID (pack all areas of tunnelling). Continue antifungal powder around wound - Turn patient q2hrs  FEN -TF's, carb modified VTE -Lovenox ID -not currently on any abx  Plan:Continue plan/wound care as above. Discussed importance of being compliant with dressing changes. No indication for surgical debridement. He can be discharged with from surgical standpoint.   LOS: 82 days    Franne Forts , [facility] 02/18/2019, 10:41 AM Pager: [phone] Mon-Thurs 7:00 am-4:30 pm Fri 7:00 am -11:30 AM Sat-Sun 7:00 am-11:30 am

## 2019-02-19 LAB — GLUCOSE, CAPILLARY
Glucose-Capillary: 77 mg/dL (ref 70–99)
Glucose-Capillary: 94 mg/dL (ref 70–99)
Glucose-Capillary: 119 mg/dL — ABNORMAL HIGH (ref 70–99)
Glucose-Capillary: 168 mg/dL — ABNORMAL HIGH (ref 70–99)

## 2019-02-19 MED ORDER — INSULIN DETEMIR 100 UNIT/ML ~~LOC~~ SOLN
15.0000 [IU] | Freq: Two times a day (BID) | SUBCUTANEOUS | Status: DC
  Administered 2019-02-19 – 2019-02-20 (×2): 15 [IU] via SUBCUTANEOUS
  Filled 2019-02-19 (×3): qty 0.15

## 2019-02-19 MED ORDER — JUVEN PO PACK
1.0000 | PACK | Freq: Two times a day (BID) | ORAL | Status: DC
  Filled 2019-02-19: qty 1

## 2019-02-19 NOTE — Progress Notes (Signed)
Physical Therapy Wound Treatment Patient Details  Name: Antonio [name]. MRN: [id number] Date of Birth: 01-22-1956  Today's Date: 02/19/2019 Time: 1002-1027 Time Calculation (min): 25 min  Subjective  Subjective: Pt in bathroom upon arrival Patient and Family Stated Goals: "go home." Prior Treatments: dressing changes  Pain Score: Premedicated; Periwound painful to palpation  Wound Assessment  Pressure Injury 12/01/18 Unstageable - Full thickness tissue loss in which the base of the ulcer is covered by slough (yellow, tan, gray, green or brown) and/or eschar (tan, brown or black) in the wound bed. sacrum (Active)  Dressing Type ABD;Gauze (Comment);Moist to dry;Barrier Film (skin prep) 02/19/2019 11:21 AM  Dressing Clean;Dry;Intact;Changed 02/19/2019 11:21 AM  Dressing Change Frequency Other (Comment) 02/19/2019 11:21 AM  State of Healing Eschar 02/19/2019 11:21 AM  Site / Wound Assessment Red;Yellow;Pink 02/19/2019 11:21 AM  % Wound base Red or Granulating 50% 02/19/2019 11:21 AM  % Wound base Yellow/Fibrinous Exudate 50% 02/19/2019 11:21 AM  % Wound base Black/Eschar 0% 02/19/2019 11:21 AM  % Wound base Other/Granulation Tissue (Comment) 0% 02/19/2019 11:21 AM  Peri-wound Assessment Erythema (blanchable);Pink 02/19/2019 11:21 AM  Wound Length (cm) 7 cm 02/18/2019 12:01 PM  Wound Width (cm) 5 cm 02/18/2019 12:01 PM  Wound Depth (cm) 4 cm 02/18/2019 12:01 PM  Wound Surface Area (cm^2) 35 cm^2 02/18/2019 12:01 PM  Wound Volume (cm^3) 140 cm^3 02/18/2019 12:01 PM  Tunneling (cm) 8 02/18/2019 12:01 PM  Undermining (cm) 4-7 02/18/2019 12:01 PM  Margins Unattached edges (unapproximated) 02/19/2019 11:21 AM  Drainage Amount Copious 02/19/2019 11:21 AM  Drainage Description Purulent 02/19/2019 11:21 AM  Treatment Debridement (Selective);Hydrotherapy (Pulse lavage);Packing (Saline gauze) 02/19/2019 11:21 AM      Hydrotherapy Pulsed lavage therapy - wound location: sacrum Pulsed Lavage with Suction  (psi): 12 psi Pulsed Lavage with Suction - Normal Saline Used: 1000 mL Pulsed Lavage Tip: Tip with splash shield Selective Debridement Selective Debridement - Location: sacrum Selective Debridement - Tools Used: Forceps;Scissors Selective Debridement - Tissue Removed: Soupy yellow necrotic tissue   Wound Assessment and Plan  Wound Therapy - Assess/Plan/Recommendations Wound Therapy - Clinical Statement: Pt with slightly decreased purulent drainage through ABD pad. Continue hydrotherapy for removal of necrotic tissue. Wound Therapy - Functional Problem List: decr mobility Factors Delaying/Impairing Wound Healing: Diabetes Mellitus;Immobility;Multiple medical problems Hydrotherapy Plan: Debridement;Dressing change;Patient/family education;Pulsatile lavage with suction Wound Therapy - Frequency: 6X / week Wound Therapy - Follow Up Recommendations: Skilled nursing facility Wound Plan: see above  Wound Therapy Goals- Improve the function of patient's integumentary system by progressing the wound(s) through the phases of wound healing (inflammation - proliferation - remodeling) by: Decrease Necrotic Tissue to: 40 Decrease Necrotic Tissue - Progress: Progressing toward goal Increase Granulation Tissue to: 60 Increase Granulation Tissue - Progress: Progressing toward goal  Goals will be updated until maximal potential achieved or discharge criteria met.  Discharge criteria: when goals achieved, discharge from hospital, MD decision/surgical intervention, no progress towards goals, refusal/missing three consecutive treatments without notification or medical reason.  GP  [name], PT, DPT Acute Rehabilitation Services Pager [phone] Office [phone]      [name] 02/19/2019, 11:24 AM

## 2019-02-19 NOTE — Progress Notes (Signed)
Physical Therapy Treatment Patient Details Name: Antonio [name]. MRN: [id number] DOB: 14-Feb-1956 Today's Date: 02/19/2019    History of Present Illness Pt is a 63 y.o. male admitted 11/28/18 with flu A and PNA; acute respiratory failure intubated 1/4. Trach placed 1/16. PEG placed 1/28. Hospital course complicared by acute MI, CHF, cardiogenic shock, and mucous plugging. Also with sacral wound.  De-cannulated 02/02/19. PMH includes DM, HTN, HLD, CAD, L BKA, R transmet amputation.    PT Comments    Pt progressing well from a PT stand point.  Supervising PT to address next visit for d/c planning.     Follow Up Recommendations  SNF     Equipment Recommendations  Rolling walker with 5" wheels;3in1 (PT)    Recommendations for Other Services       Precautions / Restrictions Precautions Precautions: Fall Precaution Comments: PEG, monitor O2 sats Required Braces or Orthoses: Other Brace Other Brace: L LE prosthesis Restrictions Weight Bearing Restrictions: No    Mobility  Bed Mobility Overal bed mobility: Modified Independent                Transfers Overall transfer level: Modified independent Equipment used: Rolling walker (2 wheeled)                Ambulation/Gait Ambulation/Gait assistance: Supervision Gait Distance (Feet): 140 Feet   Gait Pattern/deviations: Step-through pattern;Trunk flexed Gait velocity: decreased   General Gait Details: No balance impairments noted, cueing for safety.  Pt not recepetive to cues.   Stairs             Wheelchair Mobility    Modified Rankin (Stroke Patients Only)       Balance Overall balance assessment: Independent   Sitting balance-Leahy Scale: Normal       Standing balance-Leahy Scale: Fair                              Cognition Arousal/Alertness: Awake/alert Behavior During Therapy: WFL for tasks assessed/performed Overall Cognitive Status: Within Functional Limits for tasks  assessed                                        Exercises General Exercises - Upper Extremity Shoulder Flexion: AAROM;Right;5 reps;Supine(95-100*) Other Exercises Other Exercises: Myofascial realease performed Rt bicep and pecs area, as well as an arm pull (MFR) and scapular mobization performed followed by AAROM shoulder FF and external rotation Other Exercises: Pt instructed in scapular adduction and depression  Other Exercises: soft tissue mobilization/joint mobiization posterior capsule performed     General Comments        Pertinent Vitals/Pain Pain Assessment: Faces Pain Score: 2  Faces Pain Scale: Hurts little more Pain Location: R shoulder Pain Descriptors / Indicators: Discomfort;Grimacing Pain Intervention(s): Monitored during session;Repositioned    Home Living                      Prior Function            PT Goals (current goals can now be found in the care plan section) Acute Rehab PT Goals Patient Stated Goal: to use his R arm better Potential to Achieve Goals: Good Progress towards PT goals: Progressing toward goals    Frequency    Min 2X/week      PT Plan Current plan remains appropriate  Co-evaluation              AM-PAC PT "6 Clicks" Mobility   Outcome Measure  Help needed turning from your back to your side while in a flat bed without using bedrails?: None Help needed moving from lying on your back to sitting on the side of a flat bed without using bedrails?: None Help needed moving to and from a bed to a chair (including a wheelchair)?: None Help needed standing up from a chair using your arms (e.g., wheelchair or bedside chair)?: A Little Help needed to walk in hospital room?: A Little Help needed climbing 3-5 steps with a railing? : A Little 6 Click Score: 21    End of Session Equipment Utilized During Treatment: Gait belt Activity Tolerance: Patient tolerated treatment well Patient left: in bed;with  call bell/phone within reach Nurse Communication: Mobility status PT Visit Diagnosis: Muscle weakness (generalized) (M62.81);Unsteadiness on feet (R26.81);Difficulty in walking, not elsewhere classified (R26.2)     Time: 7026-3785 PT Time Calculation (min) (ACUTE ONLY): 14 min  Charges:  $Gait Training: 8-22 mins                     Joycelyn Rua, PTA Acute Rehabilitation Services Pager [phone] Office [phone]     Karyl Sharrar Artis Delay 02/19/2019, 5:02 PM

## 2019-02-19 NOTE — Progress Notes (Addendum)
Nutrition Follow-up   RD working remotely - Late Entry  INTERVENTION:    Juven po BID, each supplement provides 80 kcal and 14 grams of amino acids; contains CaHMB, glutamine, and arginine  MVI with minerals po daily   D/C Prostat liquid protein  NUTRITION DIAGNOSIS:   Increased nutrient needs related to wound healing, acute illness as evidenced by estimated needs, ongoing  GOAL:   Patient will meet greater than or equal to 90% of their needs, progressing  MONITOR:   PO intake, Supplement acceptance, Labs, Skin, Weight trends, I & O's  ASSESSMENT:   63 y/o M who presented to [facility] 1/3 with fever, cough, chest pain & SOB. Pt found to be Influenza A positive. Developed progressive respiratory distress requiring intubation in the ER.     1/03 Admit, Influenza A positive 1/06 Self extubated  1/07 Re-intubated  1/08 Self-extubated  1/16  tracheostomy 1/28  PEG tube placement 3/06  trach capped, MBSS with rec for Dys 2, honey-thick liquids, TF held 3/09  decannulated, advanced to Dysphagia 3 with honey-thick liquids 3/10  NPO due to concern for aspiration 3/11  MBSS with rec for Dysphagia 2, honey-thick liquids 3/17  Speech Path signed off, pt noncompliant with modified diet 3/19  nocturnal TF (Osmolite 1.2) discontinued via PEG per RD  Pt currently on a regular, thin liquid consistency diet (CHO Modified). His average % PO intake at meals is 75% per flowsheet records.  Per MAR pt has been refusing Prostat liquid supplements. Will trial Juven supplement po BID to promote wound healing.  Hospitalist note 3/26 reviewed; pt with no signs of aspiration at this point.  If he continues to tolerate diet well PEG tube can possibly be removed.  Pt is receiving hydrotherapy for sacral wound per PT. He is medically stable; awaiting SNF placement.  Labs & medications reviewed. CBG's [phone].  Diet Order:   Diet Order            Diet Carb Modified Fluid consistency:  Thin; Room service appropriate? Yes  Diet effective now             EDUCATION NEEDS:   Education needs have been addressed  Skin:  Skin Assessment: Skin Integrity Issues: Stage I: L head Stage IV: sacrum  Last BM:  3/26   Intake/Output Summary (Last 24 hours) at 02/19/2019 2347 Last data filed at 02/19/2019 1959 Gross per 24 hour  Intake 240 ml  Output 600 ml  Net -360 ml   Height:   Ht Readings from Last 1 Encounters:  11/29/18 6' (1.829 m)   Weight:   Wt Readings from Last 1 Encounters:  02/19/19 88 kg   Ideal Body Weight:  76.1 kg (adjusted for L BKA)  BMI:  Body mass index is 26.31 kg/m.  Estimated Nutritional Needs:   Kcal:  2200-2400  Protein:  120-135 gm  Fluid:  > 2 L/day  Maureen Chatters, RD, LDN Pager #: [phone] After-Hours Pager #: [phone]

## 2019-02-19 NOTE — Progress Notes (Signed)
PROGRESS NOTE        PATIENT DETAILS Name: Antonio RichardsDaniel Watson Zawadzki Jr. Age: 63 y.o. Sex: male Date of Birth: [birth year] Admit Date: 11/28/2018 Admitting Physician Reyes IvanWael Z Aljishi, MD ZOX:[id number]:York, Orie Routegina F, NP  Brief Narrative: Patient is a 63 y.o. male admitted on 1/3 with influenza A causing acute hypoxic respiratory failure, cardiogenic shock-required prolonged mechanical ventilation-is currently s/p tracheostomy and PEG tube placement. He has improved now appropriately and waiting to go to SNF  Significant events: 1/03  Admit, Influenza A positive 1/06  Self extubated  1/07  Re-intubated  1/08  Self-extubated  1/16 tracheostomy 1/21  Trach revision to XLT distal 1/28 PEG tube 2/03  Weaned 15 minutes  2/11  ATC  2/14  Trach changed to #4, mucus plugging >back to #5 XLT 2/15  Failed ATC with increased WOB 2/16  Failed ATC with increased WOB  2/21 back on pressure support 2/24 back on TC  01/27/19 - 5 XLT cuffless trach  01/29/2019: Size 4 cuff less tracheostomy placed 01/30/2019: Capping trials initiated 02/02/2019: Tracheostomy decannulated  Subjective: No major issues overnight-no chest pain or shortness of breath.   Patient has no complaints.  He is eager to get out of the hospital, however has not found any place to go. Blood sugars were low in the morning.  Was only given 15 units of Levemir.  Will change to 15 units twice a day.  Assessment/Plan:   Stage IV sacral/coccygeal ulceration: Continue with hydrotherapy while inpatient-general surgery and wound care following.  Recent MRI lumbar spine negative for osteomyelitis.  Defer further to general surgery.  Acute hypoxic respiratory failure secondary to influenza A, pseudomonal/Corynebacterium pneumonia: Prolonged hospitalization requiring prolonged ventilator support-subsequently tracheostomy on 1/16.  Currently decannulated on 3/9-Per last PCCM note-if the wound does not close in 2 weeks-may need  evaluation for tracheocutaneous fistula. Stable now.  Acute systolic heart failure (EF 20-25% on 1/4, EF 60-65% by TTE on 1/29): Compensated-continue beta-blocker.  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  Cardiology recommending further work-up to be done in the outpatient setting.  Dysphagia: Speech therapy following-recommendations were for a dysphagia 2 diet-however patient noncompliant-and as a result SLP has upgraded to a regular diet.  No signs of aspiration at this point.  If continues to tolerate diet well-suspect we can contemplate removing PEG tube in the next few days. On regular diet.  Metabolic encephalopathy: Resolved.  Etiology felt to be secondary to hypoxia/pneumonia. Normal now.  DM-2: Mild hypoglycemic episode this morning-decrease Levemir to 15 units twice daily-continue SSI and follow.   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  Debility/deconditioning: Secondary to acute illness-awaiting SNF bed. Social worker on board.  DVT Prophylaxis: Prophylactic Lovenox   Code Status: Full code  Family Communication: None at bedside  Disposition Plan: Remain inpatient-SNF on discharge. Medically stable.   Antimicrobial agents: Anti-infectives (From admission, onward)   Start     Dose/Rate Route Frequency Ordered Stop   02/04/19 1300  vancomycin (VANCOCIN) 1,250 mg in sodium chloride 0.9 % 250 mL IVPB  Status:  Discontinued     1,250 mg 166.7 mL/hr over 90 Minutes Intravenous Every 12 hours 02/04/19 1200 02/06/19 1002   02/04/19 1200  metroNIDAZOLE (FLAGYL) IVPB 500 mg  Status:  Discontinued  500 mg 100 mL/hr over 60 Minutes Intravenous Every 8 hours 02/04/19 1146 02/06/19 1002   02/04/19 0930  ceFEPIme (MAXIPIME) 2 g in sodium chloride 0.9 % 100 mL IVPB     2 g 200 mL/hr over 30 Minutes Intravenous Every 8 hours 02/04/19 0926 02/08/19 2359    02/03/19 1200  meropenem (MERREM) 1 g in sodium chloride 0.9 % 100 mL IVPB  Status:  Discontinued     1 g 200 mL/hr over 30 Minutes Intravenous Every 8 hours 02/03/19 1054 02/04/19 0924   01/19/19 1400  cefTAZidime (FORTAZ) 2 g in sodium chloride 0.9 % 100 mL IVPB     2 g 200 mL/hr over 30 Minutes Intravenous Every 8 hours 01/19/19 1120 01/24/19 2359   01/15/19 1500  cefTAZidime (FORTAZ) 2 g in sodium chloride 0.9 % 100 mL IVPB     2 g 200 mL/hr over 30 Minutes Intravenous Every 8 hours 01/15/19 1448 01/19/19 0542   01/14/19 1400  colistimethate ((COLYMYCIN-M)) nebulized solution 150 mg  Status:  Discontinued     150 mg Inhalation 3 times daily 01/14/19 1142 01/19/19 1119   01/14/19 1300  meropenem (MERREM) 1 g in sodium chloride 0.9 % 100 mL IVPB  Status:  Discontinued     1 g 200 mL/hr over 30 Minutes Intravenous Every 8 hours 01/14/19 1235 01/15/19 1448   12/23/18 1506  vancomycin (VANCOCIN) 1-5 GM/200ML-% IVPB    Note to Pharmacy:  [facility]   : cabinet override      12/23/18 1506 12/23/18 1515   12/20/18 2200  vancomycin (VANCOCIN) IVPB 1000 mg/200 mL premix  Status:  Discontinued     1,000 mg 200 mL/hr over 60 Minutes Intravenous Every 12 hours 12/20/18 1804 12/22/18 1413   12/18/18 0800  ceFEPIme (MAXIPIME) 1 g in sodium chloride 0.9 % 100 mL IVPB  Status:  Discontinued     1 g 200 mL/hr over 30 Minutes Intravenous Every 8 hours 12/18/18 0713 12/19/18 1201   12/17/18 0530  vancomycin (VANCOCIN) 1,500 mg in sodium chloride 0.9 % 500 mL IVPB  Status:  Discontinued     1,500 mg 250 mL/hr over 120 Minutes Intravenous Every 12 hours 12/16/18 1704 12/20/18 1804   12/16/18 1730  cefTAZidime (FORTAZ) 2 g in sodium chloride 0.9 % 100 mL IVPB  Status:  Discontinued     2 g 200 mL/hr over 30 Minutes Intravenous Every 8 hours 12/16/18 1704 12/18/18 0702   12/16/18 1730  vancomycin (VANCOCIN) 2,000 mg in sodium chloride 0.9 % 500 mL IVPB     2,000 mg 250 mL/hr over 120 Minutes  Intravenous  Once 12/16/18 1704 12/16/18 2030   12/07/18 1300  erythromycin 500 mg in sodium chloride 0.9 % 100 mL IVPB  Status:  Discontinued     500 mg 100 mL/hr over 60 Minutes Intravenous Every 6 hours 12/07/18 1118 12/16/18 1627   12/06/18 1500  vancomycin (VANCOCIN) 1,250 mg in sodium chloride 0.9 % 250 mL IVPB  Status:  Discontinued     1,250 mg 166.7 mL/hr over 90 Minutes Intravenous Every 12 hours 12/06/18 1436 12/07/18 1118   12/06/18 0100  vancomycin (VANCOCIN) 1,250 mg in sodium chloride 0.9 % 250 mL IVPB     1,250 mg 166.7 mL/hr over 90 Minutes Intravenous  Once 12/05/18 1054 12/06/18 0146   12/05/18 1200  vancomycin (VANCOCIN) 2,000 mg in sodium chloride 0.9 % 500 mL IVPB     2,000 mg 250 mL/hr over 120 Minutes Intravenous  Once 12/05/18 1054 12/05/18 1350   12/05/18 1200  cefTAZidime (FORTAZ) 1 g in sodium chloride 0.9 % 100 mL IVPB  Status:  Discontinued     1 g 200 mL/hr over 30 Minutes Intravenous Every 8 hours 12/05/18 1106 12/07/18 1118   11/30/18 2200  oseltamivir (TAMIFLU) 6 MG/ML suspension 30 mg     30 mg Per Tube 2 times daily 11/30/18 1348 12/03/18 2159   11/30/18 2200  ceFEPIme (MAXIPIME) 1 g in sodium chloride 0.9 % 100 mL IVPB  Status:  Discontinued     1 g 200 mL/hr over 30 Minutes Intravenous Every 12 hours 11/30/18 1350 12/01/18 1039   11/29/18 2200  ceFEPIme (MAXIPIME) 2 g in sodium chloride 0.9 % 100 mL IVPB  Status:  Discontinued     2 g 200 mL/hr over 30 Minutes Intravenous Every 12 hours 11/29/18 1645 11/30/18 1350   11/29/18 1100  oseltamivir (TAMIFLU) 6 MG/ML suspension 75 mg  Status:  Discontinued     75 mg Per Tube 2 times daily 11/29/18 1000 11/30/18 1348   11/28/18 2300  oseltamivir (TAMIFLU) capsule 75 mg  Status:  Discontinued     75 mg Oral 2 times daily 11/28/18 2238 11/29/18 1000   11/28/18 2030  oseltamivir (TAMIFLU) capsule 75 mg     75 mg Oral  Once 11/28/18 2029 11/28/18 2207   11/28/18 1745  vancomycin (VANCOCIN) 2,000 mg in sodium  chloride 0.9 % 500 mL IVPB     2,000 mg 250 mL/hr over 120 Minutes Intravenous  Once 11/28/18 1741 11/28/18 2203   11/28/18 1745  piperacillin-tazobactam (ZOSYN) IVPB 3.375 g     3.375 g 100 mL/hr over 30 Minutes Intravenous  Once 11/28/18 1741 11/28/18 1834      Procedures: See above  CONSULTS:  cardiology, pulmonary/intensive care and general surgery  Time spent: 15 minutes  MEDICATIONS: Scheduled Meds:  arformoterol  15 mcg Nebulization BID   aspirin  81 mg Per Tube Daily   atorvastatin  40 mg Per Tube q1800   budesonide (PULMICORT) nebulizer solution  0.5 mg Nebulization BID   carvedilol  3.125 mg Per Tube BID WC   collagenase   Topical Daily   enoxaparin (LOVENOX) injection  40 mg Subcutaneous Q24H   famotidine  20 mg Per Tube BID   feeding supplement (PRO-STAT SUGAR FREE 64)  30 mL Oral BID   folic acid  1 mg Per Tube Daily   guaiFENesin  5 mL Per Tube Q12H   insulin aspart  0-20 Units Subcutaneous TID WC   insulin detemir  20 Units Subcutaneous BID   multivitamin with minerals  1 tablet Oral Daily   polyethylene glycol  17 g Per Tube Daily   tapentadol  50 mg Oral QID   thiamine  100 mg Per Tube Daily   Continuous Infusions:  sodium chloride 10 mL/hr at 01/24/19 2000   PRN Meds:.sodium chloride, acetaminophen (TYLENOL) oral liquid 160 mg/5 mL, albuterol, alum & mag hydroxide-simeth **AND** lidocaine, bisacodyl, clonazePAM, docusate, ipratropium-albuterol, oxyCODONE, Resource ThickenUp Clear, senna   PHYSICAL EXAM: Vital signs: Vitals:   02/18/19 2008 02/18/19 2045 02/19/19 0452 02/19/19 0457  BP:  128/65 131/69   Pulse:  79 75   Resp:  16 16   Temp:  98.1 F (36.7 C) 98.2 F (36.8 C)   TempSrc:  Oral Oral   SpO2: 95% 97% 96%   Weight:    88 kg  Height:       [employer]  02/12/19 0542 02/13/19 0436 02/19/19 0457  Weight: 87 kg 63 kg 88 kg   Body mass index is 26.31 kg/m.   General appearance :Awake, alert, not in any  distress.  HEENT: Atraumatic and Normocephalic Resp:Good air entry bilaterally, no added sounds  CVS: S1 S2 regular, no murmurs.  GI: Bowel sounds present, Non tender and not distended with no gaurding, rigidity or rebound.No organomegaly Extremities: Left AKA Neurology: Nonfocal Musculoskeletal:No digital cyanosis Skin:No Rash, warm and dry  I have personally reviewed following labs and imaging studies  LABORATORY DATA: CBC: Recent Labs  Lab 02/15/19 0455  WBC 10.1  HGB 11.3*  HCT 36.2*  MCV 86.2  PLT 264    Basic Metabolic Panel: Recent Labs  Lab 02/15/19 0455  NA 136  K 4.0  CL 97*  CO2 30  GLUCOSE 130*  BUN 25*  CREATININE 0.59*  CALCIUM 9.1    GFR: Estimated Creatinine Clearance: 106.4 mL/min (A) (by C-G formula based on SCr of 0.59 mg/dL (L)).  Liver Function Tests: No results for input(s): AST, ALT, ALKPHOS, BILITOT, PROT, ALBUMIN in the last 168 hours. No results for input(s): LIPASE, AMYLASE in the last 168 hours. No results for input(s): AMMONIA in the last 168 hours.  Coagulation Profile: No results for input(s): INR, PROTIME in the last 168 hours.  Cardiac Enzymes: No results for input(s): CKTOTAL, CKMB, CKMBINDEX, TROPONINI in the last 168 hours.  BNP (last 3 results) No results for input(s): PROBNP in the last 8760 hours.  HbA1C: No results for input(s): HGBA1C in the last 72 hours.  CBG: Recent Labs  Lab 02/18/19 0825 02/18/19 1149 02/18/19 2047 02/18/19 2257 02/19/19 0748  GLUCAP 104* 114* 101* 118* 77    Lipid Profile: No results for input(s): CHOL, HDL, LDLCALC, TRIG, CHOLHDL, LDLDIRECT in the last 72 hours.  Thyroid Function Tests: No results for input(s): TSH, T4TOTAL, FREET4, T3FREE, THYROIDAB in the last 72 hours.  Anemia Panel: No results for input(s): VITAMINB12, FOLATE, FERRITIN, TIBC, IRON, RETICCTPCT in the last 72 hours.  Urine analysis:    Component Value Date/Time   COLORURINE YELLOW 02/04/2019 1400    APPEARANCEUR HAZY (A) 02/04/2019 1400   LABSPEC 1.028 02/04/2019 1400   PHURINE 5.0 02/04/2019 1400   GLUCOSEU 150 (A) 02/04/2019 1400   HGBUR NEGATIVE 02/04/2019 1400   BILIRUBINUR NEGATIVE 02/04/2019 1400   KETONESUR NEGATIVE 02/04/2019 1400   PROTEINUR 100 (A) 02/04/2019 1400   NITRITE NEGATIVE 02/04/2019 1400   LEUKOCYTESUR NEGATIVE 02/04/2019 1400    Sepsis Labs: Lactic Acid, Venous    Component Value Date/Time   LATICACIDVEN 1.3 01/09/2019 0620    MICROBIOLOGY: No results found for this or any previous visit (from the past 240 hour(s)).  RADIOLOGY STUDIES/RESULTS: Mr Lumbar Spine W Wo Contrast  Result Date: 02/04/2019 CLINICAL DATA:  Coccygeal wound infection. EXAM: MRI LUMBAR SPINE WITHOUT AND WITH CONTRAST TECHNIQUE: Multiplanar and multiecho pulse sequences of the lumbar spine were obtained without and with intravenous contrast. CONTRAST:  7 mL Gadavist COMPARISON:  None. FINDINGS: Segmentation: Normal. The lowest disc space is considered to be L5-S1. Alignment:  Normal Vertebrae: There is no bone marrow edema. No compression fracture. No abnormal contrast enhancement. Normal T1-weighted signal is preserved throughout the visualized spine and sacrum. The coccyx is outside of the field of view. Conus medullaris and cauda equina: The conus medullaris terminates at the L1 level. The cauda equina and conus medullaris are both normal. Paraspinal and other soft tissues: The visualized retroperitoneal organs and paraspinal  soft tissues are normal. Disc levels: Sagittal plane imaging includes the T12-L1 disc level through the upper sacrum, with axial imaging of the L1-2 to L5-S1 disc levels. T12-L1: Normal. L1-2: Normal. L2-3: Disc desiccation with small bulge and superimposed central protrusion. No central spinal canal stenosis. No neural foraminal stenosis. Normal facets. L3-4: Disc desiccation and small central protrusion. No spinal canal or neural foraminal stenosis. L4-5: Minimal disc  bulge no spinal canal or neural foraminal stenosis. L5-S1: Central/right subarticular disc protrusion with narrowing of the right lateral recess. No central spinal canal stenosis or neural foraminal stenosis. The visualized portion of the sacrum is normal. IMPRESSION: 1. No evidence of active osteomyelitis of the lumbar spine or sacrum. Please note that the coccyx is outside the field of view. 2. No fluid collection or other focal abnormality of the soft tissues dorsal to the spine. 3. Multilevel mild degenerative disc disease. Mild narrowing of the right L5-S1 lateral recess could cause radicular symptoms in a right S1 distribution. Electronically Signed   By: Deatra Robinson M.D.   On: 02/04/2019 23:06   Dg Chest Port 1 View  Result Date: 02/04/2019 CLINICAL DATA:  Dyspnea. EXAM: PORTABLE CHEST 1 VIEW COMPARISON:  One-view chest x-ray 02/03/2019 FINDINGS: The heart is mildly enlarged. Mild pulmonary vascular congestion is stable. Elevation of the right hemidiaphragm is again noted. There is some volume loss on the right with associated atelectasis. No significant airspace consolidation is present. A small right effusion is not excluded. IMPRESSION: 1. Mild pulmonary vascular congestion without frank edema. 2. Similar appearance of right basilar atelectasis. Electronically Signed   By: Marin Roberts M.D.   On: 02/04/2019 08:31   Dg Chest Port 1 View  Result Date: 02/03/2019 CLINICAL DATA:  Shortness of breath EXAM: PORTABLE CHEST 1 VIEW COMPARISON:  01/19/2019 FINDINGS: Cardiac shadows within normal limits. Elevation of the right hemidiaphragm is noted with right basilar atelectasis. No other focal infiltrate is seen. Gastrostomy tube is noted within the stomach. IMPRESSION: Mild right basilar atelectasis. Electronically Signed   By: Alcide Clever M.D.   On: 02/03/2019 08:24   Dg Swallowing Func-speech Pathology  Result Date: 02/04/2019 Objective Swallowing Evaluation: Type of Study: MBS-Modified  Barium Swallow Study  Patient Details Name: Antonio [name]. MRN: [id number] Date of Birth: July 09, 1956 Today's Date: 02/04/2019 Time: SLP Start Time (ACUTE ONLY): 0920 -SLP Stop Time (ACUTE ONLY): 0931 SLP Time Calculation (min) (ACUTE ONLY): 11 min Past Medical History: Past Medical History: Diagnosis Date  Coronary artery disease   Diabetes mellitus without complication (HCC)   Hyperlipidemia   Hypertension   Osteomyelitis (HCC) 03/25/2017  RT FOOT Past Surgical History: Past Surgical History: Procedure Laterality Date  AMPUTATION Right 03/27/2017  Procedure: 1st Ray Amputation Right Foot;  Surgeon: Nadara Mustard, MD;  Location: [facility] OR;  Service: Orthopedics;  Laterality: Right;  APPENDECTOMY    BELOW KNEE LEG AMPUTATION Left   CARDIAC CATHETERIZATION    CORONARY STENT INTERVENTION  2005  Great toe amputation right.    I&D EXTREMITY Left 12/30/2015  Procedure: IRRIGATION AND DEBRIDEMENT LEFT FOOT, TRANSMETATARSAL AMPUTATION WITH APPLICATION OF ANTIBIOTIC BEADS AND WOUND VAC;  Surgeon: Nadara Mustard, MD;  Location: [facility] OR;  Service: Orthopedics;  Laterality: Left;  IR GASTROSTOMY TUBE MOD SED  12/23/2018  TONSILLECTOMY    TRACHEOSTOMY  11/2018 HPI: 63 year old male coming in with fever cough chest pain and shortness of breath was found to have influenza A. PMH: diabetes mellitus, hypertension.  Intubated multiple times 1/3 to  1/16 with trach placed 1/16. Repeat MBS on 3/28 for possible initiation of po's. This is pt's 3rd MBS. Plan to repeat to determine upgrade initially; on 3/10 concern pt may have aspirated and made NPO.   Subjective: pt is alert and cooperative but needs encouragement Assessment / Plan / Recommendation CHL IP CLINICAL IMPRESSIONS 02/04/2019 Clinical Impression Pt has been decannulated since 3/9 with evidence of incomplete stoma closure indicated by breathy vocal quality. He continues to exhibit moderate-severe pharyngeal dysphagia noteably due to incompetent laryngeal closure and poor  retroflexion of epiglottis. Honey thick penetrated vestibule with initial swallow of MBS and expelled with cues for cough and was not penetrated with subsequent trial. Nectar thick entered vestibule with penetration aspiration score (PAS) of 3, 5 & 7 (did not exit trachea with cough). Chin tuck actually facilitated entrance of barium into trachea and swallow with head in neutral applying pressure to gauze/stoma to increase pressure was ineffective. Mild-mod amount of vallecular and pyriform sinus residue not significantly improved. Recommend pt resume po's of honey thick liquids, texture of Dys 2, multiple swallows and hard coughs/throat clear after every other bite/sip. Pt should continue to receive ST at skilled nursing facility for continued intervention and repeat MBS when appropriate (not before 6-8 weeks). Will continue to follow while here.  SLP Visit Diagnosis Dysphagia, pharyngeal phase (R13.13) Attention and concentration deficit following -- Frontal lobe and executive function deficit following -- Impact on safety and function Severe aspiration risk   CHL IP TREATMENT RECOMMENDATION 02/04/2019 Treatment Recommendations Therapy as outlined in treatment plan below   Prognosis 02/04/2019 Prognosis for Safe Diet Advancement Good Barriers to Reach Goals -- Barriers/Prognosis Comment -- CHL IP DIET RECOMMENDATION 02/04/2019 SLP Diet Recommendations Dysphagia 2 (Fine chop) solids;Honey thick liquids Liquid Administration via Cup;No straw Medication Administration Whole meds with puree Compensations Slow rate;Small sips/bites;Multiple dry swallows after each bite/sip;Hard cough after swallow;Clear throat intermittently Postural Changes Seated upright at 90 degrees;Remain semi-upright after after feeds/meals (Comment)   CHL IP OTHER RECOMMENDATIONS 02/04/2019 Recommended Consults -- Oral Care Recommendations Oral care BID Other Recommendations --   CHL IP FOLLOW UP RECOMMENDATIONS 02/04/2019 Follow up Recommendations  Skilled Nursing facility   [facility] IP FREQUENCY AND DURATION 02/04/2019 Speech Therapy Frequency (ACUTE ONLY) min 2x/week Treatment Duration 2 weeks      CHL IP ORAL PHASE 02/04/2019 Oral Phase WFL Oral - Pudding Teaspoon -- Oral - Pudding Cup -- Oral - Honey Teaspoon -- Oral - Honey Cup WFL Oral - Nectar Teaspoon -- Oral - Nectar Cup WFL Oral - Nectar Straw -- Oral - Thin Teaspoon -- Oral - Thin Cup NT Oral - Thin Straw -- Oral - Puree NT Oral - Mech Soft -- Oral - Regular NT Oral - Multi-Consistency -- Oral - Pill -- Oral Phase - Comment --  CHL IP PHARYNGEAL PHASE 02/04/2019 Pharyngeal Phase Impaired Pharyngeal- Pudding Teaspoon -- Pharyngeal -- Pharyngeal- Pudding Cup -- Pharyngeal -- Pharyngeal- Honey Teaspoon NT Pharyngeal -- Pharyngeal- Honey Cup Penetration/Aspiration during swallow;Pharyngeal residue - valleculae;Pharyngeal residue - pyriform;Reduced airway/laryngeal closure Pharyngeal Material enters airway, remains ABOVE vocal cords and not ejected out Pharyngeal- Nectar Teaspoon NT Pharyngeal -- Pharyngeal- Nectar Cup Penetration/Aspiration during swallow;Reduced airway/laryngeal closure;Pharyngeal residue - valleculae;Pharyngeal residue - pyriform Pharyngeal Material enters airway, passes BELOW cords and not ejected out despite cough attempt by patient;Material enters airway, CONTACTS cords and not ejected out Pharyngeal- Nectar Straw -- Pharyngeal -- Pharyngeal- Thin Teaspoon NT Pharyngeal -- Pharyngeal- Thin Cup NT Pharyngeal -- Pharyngeal- Thin Straw -- Pharyngeal --  Pharyngeal- Puree NT Pharyngeal -- Pharyngeal- Mechanical Soft -- Pharyngeal -- Pharyngeal- Regular NT Pharyngeal -- Pharyngeal- Multi-consistency -- Pharyngeal -- Pharyngeal- Pill -- Pharyngeal -- Pharyngeal Comment --  CHL IP CERVICAL ESOPHAGEAL PHASE 02/04/2019 Cervical Esophageal Phase WFL Pudding Teaspoon -- Pudding Cup -- Honey Teaspoon -- Honey Cup -- Nectar Teaspoon -- Nectar Cup -- Nectar Straw -- Thin Teaspoon -- Thin Cup -- Thin Straw  -- Puree -- Mechanical Soft -- Regular -- Multi-consistency -- Pill -- Cervical Esophageal Comment -- Royce Macadamia 02/04/2019, 10:23 AM Breck Coons [employer] administrator Pager [phone] Office [phone]              Dg Swallowing Func-speech Pathology  Result Date: 01/30/2019 Objective Swallowing Evaluation: Type of Study: MBS-Modified Barium Swallow Study  Patient Details Name: Antonio [name]. MRN: [id number] Date of Birth: August 19, 1956 Today's Date: 01/30/2019 Time: SLP Start Time (ACUTE ONLY): 1328 -SLP Stop Time (ACUTE ONLY): 1348 SLP Time Calculation (min) (ACUTE ONLY): 20 min Past Medical History: Past Medical History: Diagnosis Date  Coronary artery disease   Diabetes mellitus without complication (HCC)   Hyperlipidemia   Hypertension   Osteomyelitis (HCC) 03/25/2017  RT FOOT Past Surgical History: Past Surgical History: Procedure Laterality Date  AMPUTATION Right 03/27/2017  Procedure: 1st Ray Amputation Right Foot;  Surgeon: Nadara Mustard, MD;  Location[facility] OR;  Service: Orthopedics;  Laterality: Right;  APPENDECTOMY    BELOW KNEE LEG AMPUTATION Left   CARDIAC CATHETERIZATION    CORONARY STENT INTERVENTION  2005  Great toe amputation right.    I&D EXTREMITY Left 12/30/2015  Procedure: IRRIGATION AND DEBRIDEMENT LEFT FOOT, TRANSMETATARSAL AMPUTATION WITH APPLICATION OF ANTIBIOTIC BEADS AND WOUND VAC;  Surgeon: Nadara Mustard, MD;  Location: [facility] OR;  Service: Orthopedics;  Laterality: Left;  IR GASTROSTOMY TUBE MOD SED  12/23/2018  TONSILLECTOMY    TRACHEOSTOMY  11/2018 HPI: 63 year old male coming in with fever cough chest pain and shortness of breath was found to have influenza A. PMH: diabetes mellitus, hypertension.  Intubated multiple times 1/3 to 1/16 with trach placed 1/16. Repeat MBS on 3/28 for possible initiation of po's.  Subjective: pt is alert and cooperative but needs encouragement Assessment / Plan / Recommendation CHL IP CLINICAL IMPRESSIONS 01/30/2019  Clinical Impression Pt's trach was capped this morning and during MBS with normal oropharyngeal pressures. Swallow function has improved from prior study despite silent aspiration with thin and penetration of nectar and honey. Laryngeal excursion, pharyngeal contraction and epiglottic inversion are not of normal strength and ROM however improved from last week. Incomplete closure led to penetration with thin, nectar and only flash with honey thick x 1. Chin tuck did not prevent penetration (silent) with nectar and vallecular and pyriform sinus residue present and was mildly increased with honey thick. Verbal cues to perform second swallow reduced volume. Puree was not penetrated this study. Recommend Dys 2 given mild oral residue, impulsivity and deconditioning. Honey thick liquids recommended with two swallows, intermittent throat clears and pills whole in applesauce. ST will continue to follow for safety and upgrade of po textures. SLP Visit Diagnosis Dysphagia, oropharyngeal phase (R13.12) Attention and concentration deficit following -- Frontal lobe and executive function deficit following -- Impact on safety and function Moderate aspiration risk;Severe aspiration risk   CHL IP TREATMENT RECOMMENDATION 01/30/2019 Treatment Recommendations Therapy as outlined in treatment plan below   Prognosis 01/30/2019 Prognosis for Safe Diet Advancement Good Barriers to Reach Goals -- Barriers/Prognosis Comment -- CHL IP DIET RECOMMENDATION  01/30/2019 SLP Diet Recommendations Honey thick liquids;Dysphagia 2 (Fine chop) solids Liquid Administration via Cup;No straw Medication Administration Whole meds with puree Compensations Slow rate;Small sips/bites;Multiple dry swallows after each bite/sip;Clear throat intermittently Postural Changes Seated upright at 90 degrees   CHL IP OTHER RECOMMENDATIONS 01/30/2019 Recommended Consults -- Oral Care Recommendations Oral care BID Other Recommendations --   CHL IP FOLLOW UP RECOMMENDATIONS  01/30/2019 Follow up Recommendations (No Data)   CHL IP FREQUENCY AND DURATION 01/30/2019 Speech Therapy Frequency (ACUTE ONLY) min 2x/week Treatment Duration 2 weeks      CHL IP ORAL PHASE 01/30/2019 Oral Phase Impaired Oral - Pudding Teaspoon -- Oral - Pudding Cup -- Oral - Honey Teaspoon -- Oral - Honey Cup Lingual/palatal residue Oral - Nectar Teaspoon -- Oral - Nectar Cup Lingual/palatal residue Oral - Nectar Straw -- Oral - Thin Teaspoon -- Oral - Thin Cup -- Oral - Thin Straw -- Oral - Puree Lingual/palatal residue Oral - Mech Soft -- Oral - Regular WFL Oral - Multi-Consistency -- Oral - Pill -- Oral Phase - Comment --  CHL IP PHARYNGEAL PHASE 01/30/2019 Pharyngeal Phase Impaired Pharyngeal- Pudding Teaspoon -- Pharyngeal -- Pharyngeal- Pudding Cup -- Pharyngeal -- Pharyngeal- Honey Teaspoon NT Pharyngeal -- Pharyngeal- Honey Cup Pharyngeal residue - valleculae;Pharyngeal residue - pyriform;Reduced epiglottic inversion;Penetration/Aspiration during swallow;Reduced airway/laryngeal closure Pharyngeal Material enters airway, remains ABOVE vocal cords then ejected out Pharyngeal- Nectar Teaspoon NT Pharyngeal -- Pharyngeal- Nectar Cup Penetration/Aspiration during swallow;Reduced airway/laryngeal closure;Reduced epiglottic inversion;Pharyngeal residue - valleculae;Pharyngeal residue - pyriform;Compensatory strategies attempted (with notebox) Pharyngeal Material enters airway, remains ABOVE vocal cords and not ejected out Pharyngeal- Nectar Straw -- Pharyngeal -- Pharyngeal- Thin Teaspoon NT Pharyngeal -- Pharyngeal- Thin Cup Penetration/Aspiration during swallow;Pharyngeal residue - valleculae;Pharyngeal residue - pyriform;Moderate aspiration;Trace aspiration;Reduced airway/laryngeal closure;Reduced epiglottic inversion Pharyngeal Material enters airway, passes BELOW cords without attempt by patient to eject out (silent aspiration) Pharyngeal- Thin Straw -- Pharyngeal -- Pharyngeal- Puree Pharyngeal residue -  pyriform;Pharyngeal residue - valleculae;Reduced epiglottic inversion Pharyngeal Material does not enter airway Pharyngeal- Mechanical Soft -- Pharyngeal -- Pharyngeal- Regular WFL Pharyngeal -- Pharyngeal- Multi-consistency -- Pharyngeal -- Pharyngeal- Pill -- Pharyngeal -- Pharyngeal Comment --  CHL IP CERVICAL ESOPHAGEAL PHASE 01/30/2019 Cervical Esophageal Phase WFL Pudding Teaspoon -- Pudding Cup -- Honey Teaspoon -- Honey Cup -- Nectar Teaspoon -- Nectar Cup -- Nectar Straw -- Thin Teaspoon -- Thin Cup -- Thin Straw -- Puree -- Mechanical Soft -- Regular -- Multi-consistency -- Pill -- Cervical Esophageal Comment -- Royce Macadamia 01/30/2019, 2:53 PM Breck Coons Litaker M.Ed Sports administrator Pager [phone] Office [phone]              Dg Swallowing Func-speech Pathology  Result Date: 01/23/2019 Objective Swallowing Evaluation: Type of Study: MBS-Modified Barium Swallow Study  Patient Details Name: Antonio [name]. MRN: [id number] Date of Birth: 1956-06-03 Today's Date: 01/23/2019 Time: SLP Start Time (ACUTE ONLY): 1254 -SLP Stop Time (ACUTE ONLY): 1308 SLP Time Calculation (min) (ACUTE ONLY): 14 min Past Medical History: Past Medical History: Diagnosis Date  Coronary artery disease   Diabetes mellitus without complication (HCC)   Hyperlipidemia   Hypertension   Osteomyelitis (HCC) 03/25/2017  RT FOOT Past Surgical History: Past Surgical History: Procedure Laterality Date  AMPUTATION Right 03/27/2017  Procedure: 1st Ray Amputation Right Foot;  Surgeon: Nadara Mustard, MD;  Location: [facility] OR;  Service: Orthopedics;  Laterality: Right;  APPENDECTOMY    BELOW KNEE LEG AMPUTATION Left   CARDIAC CATHETERIZATION    CORONARY STENT  INTERVENTION  2005  Great toe amputation right.    I&D EXTREMITY Left 12/30/2015  Procedure: IRRIGATION AND DEBRIDEMENT LEFT FOOT, TRANSMETATARSAL AMPUTATION WITH APPLICATION OF ANTIBIOTIC BEADS AND WOUND VAC;  Surgeon: Nadara Mustard, MD;  Location: [facility] OR;   Service: Orthopedics;  Laterality: Left;  IR GASTROSTOMY TUBE MOD SED  12/23/2018  TONSILLECTOMY    TRACHEOSTOMY  11/2018 HPI: 63 year old male coming in with fever cough chest pain and shortness of breath was found to have influenza A. PMH: diabetes mellitus, hypertension.  Intubated multiple times 1/3 to 1/16 with trach placed 1/16.   Subjective: pt is alert and cooperative but needs encouragement Assessment / Plan / Recommendation CHL IP CLINICAL IMPRESSIONS 01/23/2019 Clinical Impression Pt has a moderate pharyngeal dysphagia due to reduced strength and airway closure s/p prolonged (and repeated) intubations. His timing for swallow trigger is good, but he has reduced hyolaryngeal movement, pharyngeal squeeze, epiglottic inversion, and tonge base retraction. Thin and nectar thick liquids enter the laryngeal vestibule during the swallow because of his incomplete closure, with penetration intermittently reaching the true vocal folds without spontaneous cough elicited. Multiple cued coughs were required to eject penetrates. Honey thick liquids and purees left more residue in the valleculae and pyriform sinuses, with intermittent penetration after the swallow from the residual material. Recommend he remain NPO except for initiating small amounts of purees and spoonfuls of thickened liquids during SLP visits only to facilitate restrengthening. Pt may also benefit from EMST and other exercises to progress strength of cough and pharyngeal musculature.   SLP Visit Diagnosis Dysphagia, pharyngeal phase (R13.13) Attention and concentration deficit following -- Frontal lobe and executive function deficit following -- Impact on safety and function Moderate aspiration risk   CHL IP TREATMENT RECOMMENDATION 01/23/2019 Treatment Recommendations Therapy as outlined in treatment plan below   Prognosis 01/23/2019 Prognosis for Safe Diet Advancement Good Barriers to Reach Goals -- Barriers/Prognosis Comment -- CHL IP DIET  RECOMMENDATION 01/23/2019 SLP Diet Recommendations NPO Liquid Administration via -- Medication Administration Via alternative means Compensations -- Postural Changes --   CHL IP OTHER RECOMMENDATIONS 01/23/2019 Recommended Consults -- Oral Care Recommendations Oral care QID Other Recommendations Have oral suction available   CHL IP FOLLOW UP RECOMMENDATIONS 01/23/2019 Follow up Recommendations Inpatient Rehab   CHL IP FREQUENCY AND DURATION 01/23/2019 Speech Therapy Frequency (ACUTE ONLY) min 2x/week Treatment Duration 2 weeks      CHL IP ORAL PHASE 01/23/2019 Oral Phase WFL Oral - Pudding Teaspoon -- Oral - Pudding Cup -- Oral - Honey Teaspoon -- Oral - Honey Cup -- Oral - Nectar Teaspoon -- Oral - Nectar Cup -- Oral - Nectar Straw -- Oral - Thin Teaspoon -- Oral - Thin Cup -- Oral - Thin Straw -- Oral - Puree -- Oral - Mech Soft -- Oral - Regular -- Oral - Multi-Consistency -- Oral - Pill -- Oral Phase - Comment --  CHL IP PHARYNGEAL PHASE 01/23/2019 Pharyngeal Phase Impaired Pharyngeal- Pudding Teaspoon -- Pharyngeal -- Pharyngeal- Pudding Cup -- Pharyngeal -- Pharyngeal- Honey Teaspoon Reduced pharyngeal peristalsis;Reduced epiglottic inversion;Reduced anterior laryngeal mobility;Reduced laryngeal elevation;Reduced airway/laryngeal closure;Reduced tongue base retraction;Pharyngeal residue - valleculae;Pharyngeal residue - pyriform Pharyngeal -- Pharyngeal- Honey Cup -- Pharyngeal -- Pharyngeal- Nectar Teaspoon Reduced pharyngeal peristalsis;Reduced epiglottic inversion;Reduced anterior laryngeal mobility;Reduced laryngeal elevation;Reduced airway/laryngeal closure;Reduced tongue base retraction;Pharyngeal residue - valleculae;Pharyngeal residue - pyriform;Penetration/Aspiration during swallow Pharyngeal Material enters airway, remains ABOVE vocal cords and not ejected out Pharyngeal- Nectar Cup Reduced pharyngeal peristalsis;Reduced epiglottic inversion;Reduced anterior laryngeal mobility;Reduced laryngeal  elevation;Reduced  airway/laryngeal closure;Reduced tongue base retraction;Pharyngeal residue - valleculae;Pharyngeal residue - pyriform;Penetration/Aspiration during swallow Pharyngeal Material enters airway, remains ABOVE vocal cords and not ejected out Pharyngeal- Nectar Straw -- Pharyngeal -- Pharyngeal- Thin Teaspoon Reduced pharyngeal peristalsis;Reduced epiglottic inversion;Reduced anterior laryngeal mobility;Reduced laryngeal elevation;Reduced airway/laryngeal closure;Reduced tongue base retraction;Penetration/Aspiration during swallow Pharyngeal Material enters airway, CONTACTS cords and not ejected out Pharyngeal- Thin Cup -- Pharyngeal -- Pharyngeal- Thin Straw -- Pharyngeal -- Pharyngeal- Puree Reduced pharyngeal peristalsis;Reduced epiglottic inversion;Reduced anterior laryngeal mobility;Reduced laryngeal elevation;Reduced airway/laryngeal closure;Reduced tongue base retraction;Pharyngeal residue - valleculae;Pharyngeal residue - pyriform;Penetration/Apiration after swallow Pharyngeal Material enters airway, remains ABOVE vocal cords and not ejected out Pharyngeal- Mechanical Soft -- Pharyngeal -- Pharyngeal- Regular -- Pharyngeal -- Pharyngeal- Multi-consistency -- Pharyngeal -- Pharyngeal- Pill -- Pharyngeal -- Pharyngeal Comment --  CHL IP CERVICAL ESOPHAGEAL PHASE 01/23/2019 Cervical Esophageal Phase WFL Pudding Teaspoon -- Pudding Cup -- Honey Teaspoon -- Honey Cup -- Nectar Teaspoon -- Nectar Cup -- Nectar Straw -- Thin Teaspoon -- Thin Cup -- Thin Straw -- Puree -- Mechanical Soft -- Regular -- Multi-consistency -- Pill -- Cervical Esophageal Comment -- Virl Axe Nix 01/23/2019, 2:32 PM  [name], M.A. CCC-SLP Acute Rehabilitation Services Pager [phone] Office [phone]               LOS: 83 days   Dorcas Carrow, MD  [facility]  If 7PM-7AM, please contact night-coverage  Please page via [email or web address]  Go to [email or web address] and use [facility]'s universal password to access. If  you do not have the password, please contact the hospital operator.  Locate the [facility] provider you are looking for under [facility] and page to a number that you can be directly reached. If you still have difficulty reaching the provider, please page the [facility] (Director on Call) for the Hospitalists listed on amion for assistance.  02/19/2019, 9:47 AM

## 2019-02-19 NOTE — Progress Notes (Signed)
Pt BS 101. Pt scheduled to receive 20 units of levemir. NP paged to see if she would like dose not given or reduced. Dose reduced to 15 units. Insulin given by Sheela Stack.

## 2019-02-19 NOTE — Progress Notes (Signed)
Occupational Therapy Treatment Patient Details Name: Antonio [name]. MRN: [id number] DOB: 1956-11-17 Today's Date: 02/19/2019    History of present illness Pt is a 63 y.o. male admitted 11/28/18 with flu A and PNA; acute respiratory failure intubated 1/4. Trach placed 1/16. PEG placed 1/28. Hospital course complicared by acute MI, CHF, cardiogenic shock, and mucous plugging. Also with sacral wound.  De-cannulated 02/02/19. PMH includes DM, HTN, HLD, CAD, L BKA, R transmet amputation.   OT comments  Pt seen for myofascial release and soft tissue mobilization Rt shoulder followed by AAROM.  He demonstrates poor tolerance for myofascial release and soft tissue mobilization due to increased, but with improved soft tissue length, he tolerated better.  Encouraged him to work on performing scapular adduction and adduction during commercial breaks throughout the day to improve carry over   Follow Up Recommendations  SNF;Supervision - Intermittent    Equipment Recommendations  None recommended by OT    Recommendations for Other Services Rehab consult    Precautions / Restrictions Precautions Precautions: Fall Precaution Comments: PEG, monitor O2 sats Required Braces or Orthoses: Other Brace Other Brace: L LE prosthesis       Mobility Bed Mobility                  Transfers                      Balance                                           ADL either performed or assessed with clinical judgement   ADL                                               Vision       Perception     Praxis      Cognition Arousal/Alertness: Awake/alert Behavior During Therapy: WFL for tasks assessed/performed Overall Cognitive Status: Within Functional Limits for tasks assessed(WFL for activities assessed )                                          Exercises General Exercises - Upper Extremity Shoulder Flexion:  AAROM;Right;5 reps;Supine(95-100*) Other Exercises Other Exercises: Myofascial realease performed Rt bicep and pecs area, as well as an arm pull (MFR) and scapular mobization performed followed by AAROM shoulder FF and external rotation Other Exercises: Pt instructed in scapular adduction and depression  Other Exercises: soft tissue mobilization/joint mobiization posterior capsule performed    Shoulder Instructions       General Comments      Pertinent Vitals/ Pain       Pain Assessment: Faces Faces Pain Scale: Hurts little more Pain Location: Rt shoulder with sof tissue mobilization and ROM  Pain Descriptors / Indicators: Discomfort;Grimacing Pain Intervention(s): Monitored during session;Limited activity within patient's tolerance  Home Living                                          Prior Functioning/Environment  Frequency  Min 3X/week        Progress Toward Goals  OT Goals(current goals can now be found in the care plan section)  Progress towards OT goals: Progressing toward goals     Plan Discharge plan remains appropriate;Frequency remains appropriate    Co-evaluation                 AM-PAC OT "6 Clicks" Daily Activity     Outcome Measure   Help from another person eating meals?: None Help from another person taking care of personal grooming?: None Help from another person toileting, which includes using toliet, bedpan, or urinal?: A Lot Help from another person bathing (including washing, rinsing, drying)?: A Little Help from another person to put on and taking off regular upper body clothing?: A Little Help from another person to put on and taking off regular lower body clothing?: A Lot 6 Click Score: 18    End of Session Equipment Utilized During Treatment: Oxygen  OT Visit Diagnosis: Unsteadiness on feet (R26.81);Other abnormalities of gait and mobility (R26.89);Muscle weakness (generalized) (M62.81);Other  symptoms and signs involving cognitive function   Activity Tolerance Patient tolerated treatment well   Patient Left in bed   Nurse Communication Mobility status        Time: 1205-1228 OT Time Calculation (min): 23 min  Charges: OT General Charges $OT Visit: 1 Visit OT Treatments $Neuromuscular Re-education: 23-37 mins  Jeani Hawking, OTR/L Acute Rehabilitation Services Pager [phone] Office [phone]    Jeani Hawking M 02/19/2019, 2:57 PM

## 2019-02-20 LAB — GLUCOSE, CAPILLARY
GLUCOSE-CAPILLARY: 122 mg/dL — AB (ref 70–99)
GLUCOSE-CAPILLARY: 133 mg/dL — AB (ref 70–99)
Glucose-Capillary: 103 mg/dL — ABNORMAL HIGH (ref 70–99)

## 2019-02-20 MED ORDER — INSULIN DETEMIR 100 UNIT/ML ~~LOC~~ SOLN: 15.0000 [IU] | Freq: Two times a day (BID) | SUBCUTANEOUS | 11 refills | Status: DC

## 2019-02-20 MED ORDER — INSULIN ASPART 100 UNIT/ML ~~LOC~~ SOLN: 0.0000 [IU] | Freq: Three times a day (TID) | SUBCUTANEOUS | 11 refills | Status: DC

## 2019-02-20 MED ORDER — TAPENTADOL HCL 75 MG PO TABS: 75.0000 mg | ORAL_TABLET | Freq: Four times a day (QID) | ORAL | 0 refills | Status: AC

## 2019-02-20 MED ORDER — OXYCODONE HCL 5 MG PO TABS: 5.0000 mg | ORAL_TABLET | Freq: Four times a day (QID) | ORAL | 0 refills | Status: DC | PRN

## 2019-02-20 NOTE — Progress Notes (Signed)
Physical Therapy Wound Treatment Patient Details  Name: Antonio [name]. MRN: [id number] Date of Birth: June 08, 1956  Today's Date: 02/20/2019 Time: 2130-8657 Time Calculation (min): 35 min  Subjective  Subjective: "How would I do dressing changes at home?" Patient and Family Stated Goals: "go home." Prior Treatments: dressing changes  Pain Score:  Pain with hydrotherapy; Pre medicated  Wound Assessment  Pressure Injury 12/01/18 Unstageable - Full thickness tissue loss in which the base of the ulcer is covered by slough (yellow, tan, gray, green or brown) and/or eschar (tan, brown or black) in the wound bed. sacrum (Active)  Dressing Type ABD;Gauze (Comment);Moist to dry;Barrier Film (skin prep) 02/20/2019  2:35 PM  Dressing Clean;Dry;Intact;Changed 02/20/2019  2:35 PM  Dressing Change Frequency Twice a day 02/20/2019  2:35 PM  State of Healing Eschar 02/20/2019  2:35 PM  Site / Wound Assessment Red;Yellow;Pink 02/20/2019  2:35 PM  % Wound base Red or Granulating 50% 02/20/2019  2:35 PM  % Wound base Yellow/Fibrinous Exudate 50% 02/20/2019  2:35 PM  % Wound base Black/Eschar 0% 02/20/2019  2:35 PM  % Wound base Other/Granulation Tissue (Comment) 0% 02/20/2019  2:35 PM  Peri-wound Assessment Erythema (blanchable);Pink 02/20/2019  2:35 PM  Wound Length (cm) 7 cm 02/18/2019 12:01 PM  Wound Width (cm) 5 cm 02/18/2019 12:01 PM  Wound Depth (cm) 4 cm 02/18/2019 12:01 PM  Wound Surface Area (cm^2) 35 cm^2 02/18/2019 12:01 PM  Wound Volume (cm^3) 140 cm^3 02/18/2019 12:01 PM  Tunneling (cm) 8 02/18/2019 12:01 PM  Undermining (cm) 4-7 02/18/2019 12:01 PM  Margins Unattached edges (unapproximated) 02/20/2019  2:35 PM  Drainage Amount Copious 02/20/2019  2:35 PM  Drainage Description Purulent 02/20/2019  2:35 PM  Treatment Debridement (Selective);Hydrotherapy (Pulse lavage);Packing (Saline gauze) 02/20/2019  2:35 PM     Hydrotherapy Pulsed lavage therapy - wound location: sacrum Pulsed Lavage with Suction  (psi): 12 psi Pulsed Lavage with Suction - Normal Saline Used: 1000 mL Pulsed Lavage Tip: Tip with splash shield Selective Debridement Selective Debridement - Location: sacrum Selective Debridement - Tools Used: Forceps;Scissors Selective Debridement - Tissue Removed: Soupy yellow necrotic tissue   Wound Assessment and Plan  Wound Therapy - Assess/Plan/Recommendations Wound Therapy - Clinical Statement: Less foul odor and drainage noted today. Continue hydrotherapy for removal of necrotic tissue. Wound Therapy - Functional Problem List: decr mobility Factors Delaying/Impairing Wound Healing: Diabetes Mellitus;Immobility;Multiple medical problems Hydrotherapy Plan: Debridement;Dressing change;Patient/family education;Pulsatile lavage with suction Wound Therapy - Frequency: 6X / week Wound Therapy - Follow Up Recommendations: Skilled nursing facility Wound Plan: see above  Wound Therapy Goals- Improve the function of patient's integumentary system by progressing the wound(s) through the phases of wound healing (inflammation - proliferation - remodeling) by: Decrease Necrotic Tissue to: 40 Decrease Necrotic Tissue - Progress: Progressing toward goal Increase Granulation Tissue to: 60 Increase Granulation Tissue - Progress: Progressing toward goal  Goals will be updated until maximal potential achieved or discharge criteria met.  Discharge criteria: when goals achieved, discharge from hospital, MD decision/surgical intervention, no progress towards goals, refusal/missing three consecutive treatments without notification or medical reason.  GP  [name], PT, DPT Acute Rehabilitation Services Pager [phone] Office [phone]      [name] 02/20/2019, 2:38 PM

## 2019-02-20 NOTE — Discharge Summary (Signed)
Physician Discharge Summary  Antonio [name]. ZOX:[id number] DOB: 1956-04-17 DOA: 11/28/2018  PCP: Dema Severin, NP  Admit date: 11/28/2018 Discharge date: 02/20/2019  Admitted From: home  Disposition:  SNF  Recommendations for Outpatient Follow-up:  1. Follow up with PCP in 1-2 weeks   Home Health:NA  Equipment/Devices:NA   Discharge Condition:stable   CODE STATUS:full code  Diet recommendation: heart healthy diet   Brief/Interim Summary: Patient is a 63 y.o. male admitted on 1/3 with influenza A causing acute hypoxic respiratory failure, cardiogenic shock-required prolonged mechanical ventilation-is currently s/p tracheostomy and PEG tube placement and all improved now. He has improved now appropriately and waiting to go to SNF for wound care and PT. Patient has been in the hospital for 84 days now.  He had extensive hospital events.  The significant events are as below: 1/03 Admit, Influenza A positive 1/06 Self extubated  1/07 Re-intubated  1/08 Self-extubated  1/16 tracheostomy 1/21 Trach revision to XLT distal 1/28 PEG tube 2/03 Weaned 15 minutes  2/11 ATC  2/14 Trach changed to #4, mucus plugging >back to #5 XLT 2/15 Failed ATC with increased WOB 2/16 Failed ATC with increased WOB  2/21 back on pressure support 2/24 back on TC  01/27/19 - 5 XLT cuffless trach  01/29/2019: Size 4 cuff less tracheostomy placed 01/30/2019: Capping trials initiated 02/02/2019: Tracheostomy decannulated Now on room air.  Discharge Diagnoses:  Active Problems:   Chest pain   SIRS (systemic inflammatory response syndrome) (HCC)   Acute respiratory failure with hypoxemia (HCC)   Pressure injury of skin   Influenza A virus present   Pneumonia   Tracheostomy status (HCC)   Acute pulmonary edema (HCC)   Hypoxia   Fever, unspecified   Tachycardia   Acute on chronic respiratory failure (HCC)   Hypoxemia   Phlegm in throat   Dysphagia   Status post tracheostomy (HCC)   HCAP  (healthcare-associated pneumonia)   Diabetes mellitus type 2 in nonobese (HCC)   Essential hypertension   S/P BKA (below knee amputation) unilateral, left (HCC)   Tachypnea   Pain   Agitation   Acute blood loss anemia   Acute respiratory failure (HCC)   Airway trauma  His diagnosis and management plan is as below:  Stage IV sacral/coccygeal ulceration:  Patient has received hydrotherapy while inpatient-general surgery and wound care following.  Recent MRI lumbar spine negative for osteomyelitis.  Defer further to general surgery. Dressing change instructions: Wet-to-dry dressing twice a day with Santyl.  To be continued.  Avoid pressure.  Acute hypoxic respiratory failure secondary to influenza A, pseudomonal/Corynebacterium pneumonia: Prolonged hospitalization requiring prolonged ventilator support-subsequently tracheostomy on 1/16.  Decannulated.  Patient on room air now.  Acute systolic heart failure (EF 20-25% on 1/4, EF 60-65% by TTE on 1/29): Compensated-continue beta-blocker.  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    Cardiology recommended outpatient follow-up.  Dysphagia: Speech therapy following-recommendations were for a dysphagia 2 diet-however patient noncompliant-and as a result SLP has upgraded to a regular diet.  No signs of aspiration at this point.  If continues to tolerate diet well-suspect we can contemplate removing PEG tube in the next few days. On regular diet.  Metabolic encephalopathy: Resolved.  Etiology felt to be secondary to hypoxia/pneumonia. Normal now.  DM-2: Patient had hypoglycemic episodes, decreased doses of total insulin and currently remains a stable.  Patient will be discharged on long-acting insulin and sliding scale insulin.    S/p right first ray  amputation and left transmetatarsal amputation: Is stable.  Chronic pain syndrome: Continue Nucynta and Tylenol.  Debility/deconditioning: Secondary to acute  illness-awaiting SNF bed. Social worker on board.  Patient is medically stable to be discharged to a skilled level of care.   Discharge Instructions  Discharge Instructions    Diet - low sodium heart healthy   Complete by:  As directed    Discharge wound care:   Complete by:  As directed    Wet to dry dressing every day as instructed   Increase activity slowly   Complete by:  As directed      Allergies as of 02/20/2019      Reactions   Iodinated Diagnostic Agents Hives, Rash, Other (See Comments)   Blisters Staph Blisters / staph   Penicillins Other (See Comments)   UNSPECIFIED REACTION  Has patient had a PCN reaction causing immediate rash, facial/tongue/throat swelling, SOB or lightheadedness with hypotension: No Has patient had a PCN reaction causing severe rash involving mucus membranes or skin necrosis: No Has patient had a PCN reaction that required hospitalization: No Has patient had a PCN reaction occurring within the last 10 years: No If all of the above answers are "NO", then may proceed with Cephalosporin use.      Medication List    STOP taking these medications   glipiZIDE 10 MG tablet Commonly known as:  GLUCOTROL   insulin glargine 100 UNIT/ML injection Commonly known as:  LANTUS   insulin regular 100 units/mL injection Commonly known as:  NOVOLIN R,HUMULIN R   lisinopril 5 MG tablet Commonly known as:  PRINIVIL,ZESTRIL   metoprolol tartrate 25 MG tablet Commonly known as:  LOPRESSOR   Tresiba 100 UNIT/ML Soln Generic drug:  Insulin Degludec     TAKE these medications   albuterol (2.5 MG/3ML) 0.083% nebulizer solution Commonly known as:  PROVENTIL Take 3 mLs (2.5 mg total) by nebulization every 2 (two) hours as needed for wheezing.   arformoterol 15 MCG/2ML Nebu Commonly known as:  BROVANA Take 2 mLs (15 mcg total) by nebulization 2 (two) times daily.   aspirin EC 81 MG tablet Take 81 mg by mouth daily.   atorvastatin 40 MG  tablet Commonly known as:  LIPITOR Take 1 tablet (40 mg total) by mouth daily at 6 PM.   carvedilol 3.125 MG tablet Commonly known as:  COREG Place 1 tablet (3.125 mg total) into feeding tube 2 (two) times daily with a meal.   collagenase ointment Commonly known as:  SANTYL Apply topically daily. Start taking on:  February 21, 2019   insulin aspart 100 UNIT/ML injection Commonly known as:  novoLOG Inject 0-20 Units into the skin 3 (three) times daily with meals.   insulin detemir 100 UNIT/ML injection Commonly known as:  LEVEMIR Inject 0.15 mLs (15 Units total) into the skin 2 (two) times daily.   multivitamin tablet Take 1 tablet by mouth daily.   tapentadol HCl 75 MG tablet Commonly known as:  Nucynta Take 1 tablet (75 mg total) by mouth 4 (four) times daily for 5 days.            Discharge Care Instructions  (From admission, onward)         Start     Ordered   02/20/19 0000  Discharge wound care:    Comments:  Wet to dry dressing every day as instructed   02/20/19 1513          Contact information for follow-up providers  Dema Severin, NP Follow up.   Contact information: [address] [place] [address] [phone]        Rollene Rotunda, MD .   Specialty:  Cardiology Contact information: [address] [place] [address] [phone]            Contact information for after-discharge care    Destination    [facility] .   Service:  Skilled Nursing Contact information: [address] [place] [address] [phone]                 Allergies  Allergen Reactions  . Iodinated Diagnostic Agents Hives, Rash and Other (See Comments)    Blisters Staph Blisters / staph  . Penicillins Other (See Comments)    UNSPECIFIED REACTION  Has patient had a PCN reaction causing immediate rash, facial/tongue/throat swelling, SOB or lightheadedness with hypotension: No Has patient  had a PCN reaction causing severe rash involving mucus membranes or skin necrosis: No Has patient had a PCN reaction that required hospitalization: No Has patient had a PCN reaction occurring within the last 10 years: No If all of the above answers are "NO", then may proceed with Cephalosporin use.    Consultations:  PCCM  Cardiology  Surgery   Procedures/Studies: Mr Lumbar Spine W Wo Contrast  Result Date: 02/04/2019 CLINICAL DATA:  Coccygeal wound infection. EXAM: MRI LUMBAR SPINE WITHOUT AND WITH CONTRAST TECHNIQUE: Multiplanar and multiecho pulse sequences of the lumbar spine were obtained without and with intravenous contrast. CONTRAST:  7 mL Gadavist COMPARISON:  None. FINDINGS: Segmentation: Normal. The lowest disc space is considered to be L5-S1. Alignment:  Normal Vertebrae: There is no bone marrow edema. No compression fracture. No abnormal contrast enhancement. Normal T1-weighted signal is preserved throughout the visualized spine and sacrum. The coccyx is outside of the field of view. Conus medullaris and cauda equina: The conus medullaris terminates at the L1 level. The cauda equina and conus medullaris are both normal. Paraspinal and other soft tissues: The visualized retroperitoneal organs and paraspinal soft tissues are normal. Disc levels: Sagittal plane imaging includes the T12-L1 disc level through the upper sacrum, with axial imaging of the L1-2 to L5-S1 disc levels. T12-L1: Normal. L1-2: Normal. L2-3: Disc desiccation with small bulge and superimposed central protrusion. No central spinal canal stenosis. No neural foraminal stenosis. Normal facets. L3-4: Disc desiccation and small central protrusion. No spinal canal or neural foraminal stenosis. L4-5: Minimal disc bulge no spinal canal or neural foraminal stenosis. L5-S1: Central/right subarticular disc protrusion with narrowing of the right lateral recess. No central spinal canal stenosis or neural foraminal stenosis. The  visualized portion of the sacrum is normal. IMPRESSION: 1. No evidence of active osteomyelitis of the lumbar spine or sacrum. Please note that the coccyx is outside the field of view. 2. No fluid collection or other focal abnormality of the soft tissues dorsal to the spine. 3. Multilevel mild degenerative disc disease. Mild narrowing of the right L5-S1 lateral recess could cause radicular symptoms in a right S1 distribution. Electronically Signed   By: Deatra Robinson M.D.   On: 02/04/2019 23:06   Dg Chest Port 1 View  Result Date: 02/04/2019 CLINICAL DATA:  Dyspnea. EXAM: PORTABLE CHEST 1 VIEW COMPARISON:  One-view chest x-ray 02/03/2019 FINDINGS: The heart is mildly enlarged. Mild pulmonary vascular congestion is stable. Elevation of the right hemidiaphragm is again noted. There is some volume loss on the right with associated atelectasis. No  significant airspace consolidation is present. A small right effusion is not excluded. IMPRESSION: 1. Mild pulmonary vascular congestion without frank edema. 2. Similar appearance of right basilar atelectasis. Electronically Signed   By: Marin Roberts M.D.   On: 02/04/2019 08:31   Dg Chest Port 1 View  Result Date: 02/03/2019 CLINICAL DATA:  Shortness of breath EXAM: PORTABLE CHEST 1 VIEW COMPARISON:  01/19/2019 FINDINGS: Cardiac shadows within normal limits. Elevation of the right hemidiaphragm is noted with right basilar atelectasis. No other focal infiltrate is seen. Gastrostomy tube is noted within the stomach. IMPRESSION: Mild right basilar atelectasis. Electronically Signed   By: Alcide Clever M.D.   On: 02/03/2019 08:24   Dg Swallowing Func-speech Pathology  Result Date: 02/04/2019 Objective Swallowing Evaluation: Type of Study: MBS-Modified Barium Swallow Study  Patient Details Name: Jehu Mccauslin. MRN: [id number] Date of Birth: Jan 23, 1956 Today's Date: 02/04/2019 Time: SLP Start Time (ACUTE ONLY): 0920 -SLP Stop Time (ACUTE ONLY): 0931 SLP Time  Calculation (min) (ACUTE ONLY): 11 min Past Medical History: Past Medical History: Diagnosis Date . Coronary artery disease  . Diabetes mellitus without complication (HCC)  . Hyperlipidemia  . Hypertension  . Osteomyelitis (HCC) 03/25/2017  RT FOOT Past Surgical History: Past Surgical History: Procedure Laterality Date . AMPUTATION Right 03/27/2017  Procedure: 1st Ray Amputation Right Foot;  Surgeon: Nadara Mustard, MD;  Location: [facility] OR;  Service: Orthopedics;  Laterality: Right; . APPENDECTOMY   . BELOW KNEE LEG AMPUTATION Left  . CARDIAC CATHETERIZATION   . CORONARY STENT INTERVENTION  2005 . Great toe amputation right.   . I&D EXTREMITY Left 12/30/2015  Procedure: IRRIGATION AND DEBRIDEMENT LEFT FOOT, TRANSMETATARSAL AMPUTATION WITH APPLICATION OF ANTIBIOTIC BEADS AND WOUND VAC;  Surgeon: Nadara Mustard, MD;  Location: [facility] OR;  Service: Orthopedics;  Laterality: Left; . IR GASTROSTOMY TUBE MOD SED  12/23/2018 . TONSILLECTOMY   . TRACHEOSTOMY  11/2018 HPI: 63 year old male coming in with fever cough chest pain and shortness of breath was found to have influenza A. PMH: diabetes mellitus, hypertension.  Intubated multiple times 1/3 to 1/16 with trach placed 1/16. Repeat MBS on 3/28 for possible initiation of po's. This is pt's 3rd MBS. Plan to repeat to determine upgrade initially; on 3/10 concern pt may have aspirated and made NPO.   Subjective: pt is alert and cooperative but needs encouragement Assessment / Plan / Recommendation CHL IP CLINICAL IMPRESSIONS 02/04/2019 Clinical Impression Pt has been decannulated since 3/9 with evidence of incomplete stoma closure indicated by breathy vocal quality. He continues to exhibit moderate-severe pharyngeal dysphagia noteably due to incompetent laryngeal closure and poor retroflexion of epiglottis. Honey thick penetrated vestibule with initial swallow of MBS and expelled with cues for cough and was not penetrated with subsequent trial. Nectar thick entered vestibule with  penetration aspiration score (PAS) of 3, 5 & 7 (did not exit trachea with cough). Chin tuck actually facilitated entrance of barium into trachea and swallow with head in neutral applying pressure to gauze/stoma to increase pressure was ineffective. Mild-mod amount of vallecular and pyriform sinus residue not significantly improved. Recommend pt resume po's of honey thick liquids, texture of Dys 2, multiple swallows and hard coughs/throat clear after every other bite/sip. Pt should continue to receive ST at skilled nursing facility for continued intervention and repeat MBS when appropriate (not before 6-8 weeks). Will continue to follow while here.  SLP Visit Diagnosis Dysphagia, pharyngeal phase (R13.13) Attention and concentration deficit following -- Frontal lobe and executive function deficit following --  Impact on safety and function Severe aspiration risk   CHL IP TREATMENT RECOMMENDATION 02/04/2019 Treatment Recommendations Therapy as outlined in treatment plan below   Prognosis 02/04/2019 Prognosis for Safe Diet Advancement Good Barriers to Reach Goals -- Barriers/Prognosis Comment -- CHL IP DIET RECOMMENDATION 02/04/2019 SLP Diet Recommendations Dysphagia 2 (Fine chop) solids;Honey thick liquids Liquid Administration via Cup;No straw Medication Administration Whole meds with puree Compensations Slow rate;Small sips/bites;Multiple dry swallows after each bite/sip;Hard cough after swallow;Clear throat intermittently Postural Changes Seated upright at 90 degrees;Remain semi-upright after after feeds/meals (Comment)   CHL IP OTHER RECOMMENDATIONS 02/04/2019 Recommended Consults -- Oral Care Recommendations Oral care BID Other Recommendations --   CHL IP FOLLOW UP RECOMMENDATIONS 02/04/2019 Follow up Recommendations Skilled Nursing facility   [facility] IP FREQUENCY AND DURATION 02/04/2019 Speech Therapy Frequency (ACUTE ONLY) min 2x/week Treatment Duration 2 weeks      CHL IP ORAL PHASE 02/04/2019 Oral Phase WFL Oral -  Pudding Teaspoon -- Oral - Pudding Cup -- Oral - Honey Teaspoon -- Oral - Honey Cup WFL Oral - Nectar Teaspoon -- Oral - Nectar Cup WFL Oral - Nectar Straw -- Oral - Thin Teaspoon -- Oral - Thin Cup NT Oral - Thin Straw -- Oral - Puree NT Oral - Mech Soft -- Oral - Regular NT Oral - Multi-Consistency -- Oral - Pill -- Oral Phase - Comment --  CHL IP PHARYNGEAL PHASE 02/04/2019 Pharyngeal Phase Impaired Pharyngeal- Pudding Teaspoon -- Pharyngeal -- Pharyngeal- Pudding Cup -- Pharyngeal -- Pharyngeal- Honey Teaspoon NT Pharyngeal -- Pharyngeal- Honey Cup Penetration/Aspiration during swallow;Pharyngeal residue - valleculae;Pharyngeal residue - pyriform;Reduced airway/laryngeal closure Pharyngeal Material enters airway, remains ABOVE vocal cords and not ejected out Pharyngeal- Nectar Teaspoon NT Pharyngeal -- Pharyngeal- Nectar Cup Penetration/Aspiration during swallow;Reduced airway/laryngeal closure;Pharyngeal residue - valleculae;Pharyngeal residue - pyriform Pharyngeal Material enters airway, passes BELOW cords and not ejected out despite cough attempt by patient;Material enters airway, CONTACTS cords and not ejected out Pharyngeal- Nectar Straw -- Pharyngeal -- Pharyngeal- Thin Teaspoon NT Pharyngeal -- Pharyngeal- Thin Cup NT Pharyngeal -- Pharyngeal- Thin Straw -- Pharyngeal -- Pharyngeal- Puree NT Pharyngeal -- Pharyngeal- Mechanical Soft -- Pharyngeal -- Pharyngeal- Regular NT Pharyngeal -- Pharyngeal- Multi-consistency -- Pharyngeal -- Pharyngeal- Pill -- Pharyngeal -- Pharyngeal Comment --  CHL IP CERVICAL ESOPHAGEAL PHASE 02/04/2019 Cervical Esophageal Phase WFL Pudding Teaspoon -- Pudding Cup -- Honey Teaspoon -- Honey Cup -- Nectar Teaspoon -- Nectar Cup -- Nectar Straw -- Thin Teaspoon -- Thin Cup -- Thin Straw -- Puree -- Mechanical Soft -- Regular -- Multi-consistency -- Pill -- Cervical Esophageal Comment -- Royce Macadamia 02/04/2019, 10:23 AM Breck Coons Lonell [employer] Careers information officer Pager [phone] Office [phone]              Dg Swallowing Func-speech Pathology  Result Date: 01/30/2019 Objective Swallowing Evaluation: Type of Study: MBS-Modified Barium Swallow Study  Patient Details Name: Ludwig Shuey. MRN: [id number] Date of Birth: 1956-08-10 Today's Date: 01/30/2019 Time: SLP Start Time (ACUTE ONLY): 1328 -SLP Stop Time (ACUTE ONLY): 1348 SLP Time Calculation (min) (ACUTE ONLY): 20 min Past Medical History: Past Medical History: Diagnosis Date . Coronary artery disease  . Diabetes mellitus without complication (HCC)  . Hyperlipidemia  . Hypertension  . Osteomyelitis (HCC) 03/25/2017  RT FOOT Past Surgical History: Past Surgical History: Procedure Laterality Date . AMPUTATION Right 03/27/2017  Procedure: 1st Ray Amputation Right Foot;  Surgeon: Nadara Mustard, MD;  Location: [facility] OR;  Service: Orthopedics;  Laterality: Right; . APPENDECTOMY   .  BELOW KNEE LEG AMPUTATION Left  . CARDIAC CATHETERIZATION   . CORONARY STENT INTERVENTION  2005 . Great toe amputation right.   . I&D EXTREMITY Left 12/30/2015  Procedure: IRRIGATION AND DEBRIDEMENT LEFT FOOT, TRANSMETATARSAL AMPUTATION WITH APPLICATION OF ANTIBIOTIC BEADS AND WOUND VAC;  Surgeon: Nadara Mustard, MD;  Location: [facility] OR;  Service: Orthopedics;  Laterality: Left; . IR GASTROSTOMY TUBE MOD SED  12/23/2018 . TONSILLECTOMY   . TRACHEOSTOMY  11/2018 HPI: 63 year old male coming in with fever cough chest pain and shortness of breath was found to have influenza A. PMH: diabetes mellitus, hypertension.  Intubated multiple times 1/3 to 1/16 with trach placed 1/16. Repeat MBS on 3/28 for possible initiation of po's.  Subjective: pt is alert and cooperative but needs encouragement Assessment / Plan / Recommendation CHL IP CLINICAL IMPRESSIONS 01/30/2019 Clinical Impression Pt's trach was capped this morning and during MBS with normal oropharyngeal pressures. Swallow function has improved from prior study despite silent aspiration with thin  and penetration of nectar and honey. Laryngeal excursion, pharyngeal contraction and epiglottic inversion are not of normal strength and ROM however improved from last week. Incomplete closure led to penetration with thin, nectar and only flash with honey thick x 1. Chin tuck did not prevent penetration (silent) with nectar and vallecular and pyriform sinus residue present and was mildly increased with honey thick. Verbal cues to perform second swallow reduced volume. Puree was not penetrated this study. Recommend Dys 2 given mild oral residue, impulsivity and deconditioning. Honey thick liquids recommended with two swallows, intermittent throat clears and pills whole in applesauce. ST will continue to follow for safety and upgrade of po textures. SLP Visit Diagnosis Dysphagia, oropharyngeal phase (R13.12) Attention and concentration deficit following -- Frontal lobe and executive function deficit following -- Impact on safety and function Moderate aspiration risk;Severe aspiration risk   CHL IP TREATMENT RECOMMENDATION 01/30/2019 Treatment Recommendations Therapy as outlined in treatment plan below   Prognosis 01/30/2019 Prognosis for Safe Diet Advancement Good Barriers to Reach Goals -- Barriers/Prognosis Comment -- CHL IP DIET RECOMMENDATION 01/30/2019 SLP Diet Recommendations Honey thick liquids;Dysphagia 2 (Fine chop) solids Liquid Administration via Cup;No straw Medication Administration Whole meds with puree Compensations Slow rate;Small sips/bites;Multiple dry swallows after each bite/sip;Clear throat intermittently Postural Changes Seated upright at 90 degrees   CHL IP OTHER RECOMMENDATIONS 01/30/2019 Recommended Consults -- Oral Care Recommendations Oral care BID Other Recommendations --   CHL IP FOLLOW UP RECOMMENDATIONS 01/30/2019 Follow up Recommendations (No Data)   CHL IP FREQUENCY AND DURATION 01/30/2019 Speech Therapy Frequency (ACUTE ONLY) min 2x/week Treatment Duration 2 weeks      CHL IP ORAL PHASE 01/30/2019  Oral Phase Impaired Oral - Pudding Teaspoon -- Oral - Pudding Cup -- Oral - Honey Teaspoon -- Oral - Honey Cup Lingual/palatal residue Oral - Nectar Teaspoon -- Oral - Nectar Cup Lingual/palatal residue Oral - Nectar Straw -- Oral - Thin Teaspoon -- Oral - Thin Cup -- Oral - Thin Straw -- Oral - Puree Lingual/palatal residue Oral - Mech Soft -- Oral - Regular WFL Oral - Multi-Consistency -- Oral - Pill -- Oral Phase - Comment --  CHL IP PHARYNGEAL PHASE 01/30/2019 Pharyngeal Phase Impaired Pharyngeal- Pudding Teaspoon -- Pharyngeal -- Pharyngeal- Pudding Cup -- Pharyngeal -- Pharyngeal- Honey Teaspoon NT Pharyngeal -- Pharyngeal- Honey Cup Pharyngeal residue - valleculae;Pharyngeal residue - pyriform;Reduced epiglottic inversion;Penetration/Aspiration during swallow;Reduced airway/laryngeal closure Pharyngeal Material enters airway, remains ABOVE vocal cords then ejected out Pharyngeal- Nectar Teaspoon NT Pharyngeal -- Pharyngeal- Nectar  Cup Penetration/Aspiration during swallow;Reduced airway/laryngeal closure;Reduced epiglottic inversion;Pharyngeal residue - valleculae;Pharyngeal residue - pyriform;Compensatory strategies attempted (with notebox) Pharyngeal Material enters airway, remains ABOVE vocal cords and not ejected out Pharyngeal- Nectar Straw -- Pharyngeal -- Pharyngeal- Thin Teaspoon NT Pharyngeal -- Pharyngeal- Thin Cup Penetration/Aspiration during swallow;Pharyngeal residue - valleculae;Pharyngeal residue - pyriform;Moderate aspiration;Trace aspiration;Reduced airway/laryngeal closure;Reduced epiglottic inversion Pharyngeal Material enters airway, passes BELOW cords without attempt by patient to eject out (silent aspiration) Pharyngeal- Thin Straw -- Pharyngeal -- Pharyngeal- Puree Pharyngeal residue - pyriform;Pharyngeal residue - valleculae;Reduced epiglottic inversion Pharyngeal Material does not enter airway Pharyngeal- Mechanical Soft -- Pharyngeal -- Pharyngeal- Regular WFL Pharyngeal --  Pharyngeal- Multi-consistency -- Pharyngeal -- Pharyngeal- Pill -- Pharyngeal -- Pharyngeal Comment --  CHL IP CERVICAL ESOPHAGEAL PHASE 01/30/2019 Cervical Esophageal Phase WFL Pudding Teaspoon -- Pudding Cup -- Honey Teaspoon -- Honey Cup -- Nectar Teaspoon -- Nectar Cup -- Nectar Straw -- Thin Teaspoon -- Thin Cup -- Thin Straw -- Puree -- Mechanical Soft -- Regular -- Multi-consistency -- Pill -- Cervical Esophageal Comment -- Royce Macadamia 01/30/2019, 2:53 PM Breck Coons [employer] Sports administrator Pager [phone] Office [phone]              Dg Swallowing Func-speech Pathology  Result Date: 01/23/2019 Objective Swallowing Evaluation: Type of Study: MBS-Modified Barium Swallow Study  Patient Details Name: Camron Monday. MRN: [id number] Date of Birth: 04/12/1956 Today's Date: 01/23/2019 Time: SLP Start Time (ACUTE ONLY): 1254 -SLP Stop Time (ACUTE ONLY): 1308 SLP Time Calculation (min) (ACUTE ONLY): 14 min Past Medical History: Past Medical History: Diagnosis Date . Coronary artery disease  . Diabetes mellitus without complication (HCC)  . Hyperlipidemia  . Hypertension  . Osteomyelitis (HCC) 03/25/2017  RT FOOT Past Surgical History: Past Surgical History: Procedure Laterality Date . AMPUTATION Right 03/27/2017  Procedure: 1st Ray Amputation Right Foot;  Surgeon: Nadara Mustard, MD;  Location: [facility] OR;  Service: Orthopedics;  Laterality: Right; . APPENDECTOMY   . BELOW KNEE LEG AMPUTATION Left  . CARDIAC CATHETERIZATION   . CORONARY STENT INTERVENTION  2005 . Great toe amputation right.   . I&D EXTREMITY Left 12/30/2015  Procedure: IRRIGATION AND DEBRIDEMENT LEFT FOOT, TRANSMETATARSAL AMPUTATION WITH APPLICATION OF ANTIBIOTIC BEADS AND WOUND VAC;  Surgeon: Nadara Mustard, MD;  Location: [facility] OR;  Service: Orthopedics;  Laterality: Left; . IR GASTROSTOMY TUBE MOD SED  12/23/2018 . TONSILLECTOMY   . TRACHEOSTOMY  11/2018 HPI: 63 year old male coming in with fever cough chest pain and  shortness of breath was found to have influenza A. PMH: diabetes mellitus, hypertension.  Intubated multiple times 1/3 to 1/16 with trach placed 1/16.   Subjective: pt is alert and cooperative but needs encouragement Assessment / Plan / Recommendation CHL IP CLINICAL IMPRESSIONS 01/23/2019 Clinical Impression Pt has a moderate pharyngeal dysphagia due to reduced strength and airway closure s/p prolonged (and repeated) intubations. His timing for swallow trigger is good, but he has reduced hyolaryngeal movement, pharyngeal squeeze, epiglottic inversion, and tonge base retraction. Thin and nectar thick liquids enter the laryngeal vestibule during the swallow because of his incomplete closure, with penetration intermittently reaching the true vocal folds without spontaneous cough elicited. Multiple cued coughs were required to eject penetrates. Honey thick liquids and purees left more residue in the valleculae and pyriform sinuses, with intermittent penetration after the swallow from the residual material. Recommend he remain NPO except for initiating small amounts of purees and spoonfuls of thickened liquids during SLP visits only to facilitate restrengthening.  Pt may also benefit from EMST and other exercises to progress strength of cough and pharyngeal musculature.   SLP Visit Diagnosis Dysphagia, pharyngeal phase (R13.13) Attention and concentration deficit following -- Frontal lobe and executive function deficit following -- Impact on safety and function Moderate aspiration risk   CHL IP TREATMENT RECOMMENDATION 01/23/2019 Treatment Recommendations Therapy as outlined in treatment plan below   Prognosis 01/23/2019 Prognosis for Safe Diet Advancement Good Barriers to Reach Goals -- Barriers/Prognosis Comment -- CHL IP DIET RECOMMENDATION 01/23/2019 SLP Diet Recommendations NPO Liquid Administration via -- Medication Administration Via alternative means Compensations -- Postural Changes --   CHL IP OTHER RECOMMENDATIONS  01/23/2019 Recommended Consults -- Oral Care Recommendations Oral care QID Other Recommendations Have oral suction available   CHL IP FOLLOW UP RECOMMENDATIONS 01/23/2019 Follow up Recommendations Inpatient Rehab   CHL IP FREQUENCY AND DURATION 01/23/2019 Speech Therapy Frequency (ACUTE ONLY) min 2x/week Treatment Duration 2 weeks      CHL IP ORAL PHASE 01/23/2019 Oral Phase WFL Oral - Pudding Teaspoon -- Oral - Pudding Cup -- Oral - Honey Teaspoon -- Oral - Honey Cup -- Oral - Nectar Teaspoon -- Oral - Nectar Cup -- Oral - Nectar Straw -- Oral - Thin Teaspoon -- Oral - Thin Cup -- Oral - Thin Straw -- Oral - Puree -- Oral - Mech Soft -- Oral - Regular -- Oral - Multi-Consistency -- Oral - Pill -- Oral Phase - Comment --  CHL IP PHARYNGEAL PHASE 01/23/2019 Pharyngeal Phase Impaired Pharyngeal- Pudding Teaspoon -- Pharyngeal -- Pharyngeal- Pudding Cup -- Pharyngeal -- Pharyngeal- Honey Teaspoon Reduced pharyngeal peristalsis;Reduced epiglottic inversion;Reduced anterior laryngeal mobility;Reduced laryngeal elevation;Reduced airway/laryngeal closure;Reduced tongue base retraction;Pharyngeal residue - valleculae;Pharyngeal residue - pyriform Pharyngeal -- Pharyngeal- Honey Cup -- Pharyngeal -- Pharyngeal- Nectar Teaspoon Reduced pharyngeal peristalsis;Reduced epiglottic inversion;Reduced anterior laryngeal mobility;Reduced laryngeal elevation;Reduced airway/laryngeal closure;Reduced tongue base retraction;Pharyngeal residue - valleculae;Pharyngeal residue - pyriform;Penetration/Aspiration during swallow Pharyngeal Material enters airway, remains ABOVE vocal cords and not ejected out Pharyngeal- Nectar Cup Reduced pharyngeal peristalsis;Reduced epiglottic inversion;Reduced anterior laryngeal mobility;Reduced laryngeal elevation;Reduced airway/laryngeal closure;Reduced tongue base retraction;Pharyngeal residue - valleculae;Pharyngeal residue - pyriform;Penetration/Aspiration during swallow Pharyngeal Material enters airway,  remains ABOVE vocal cords and not ejected out Pharyngeal- Nectar Straw -- Pharyngeal -- Pharyngeal- Thin Teaspoon Reduced pharyngeal peristalsis;Reduced epiglottic inversion;Reduced anterior laryngeal mobility;Reduced laryngeal elevation;Reduced airway/laryngeal closure;Reduced tongue base retraction;Penetration/Aspiration during swallow Pharyngeal Material enters airway, CONTACTS cords and not ejected out Pharyngeal- Thin Cup -- Pharyngeal -- Pharyngeal- Thin Straw -- Pharyngeal -- Pharyngeal- Puree Reduced pharyngeal peristalsis;Reduced epiglottic inversion;Reduced anterior laryngeal mobility;Reduced laryngeal elevation;Reduced airway/laryngeal closure;Reduced tongue base retraction;Pharyngeal residue - valleculae;Pharyngeal residue - pyriform;Penetration/Apiration after swallow Pharyngeal Material enters airway, remains ABOVE vocal cords and not ejected out Pharyngeal- Mechanical Soft -- Pharyngeal -- Pharyngeal- Regular -- Pharyngeal -- Pharyngeal- Multi-consistency -- Pharyngeal -- Pharyngeal- Pill -- Pharyngeal -- Pharyngeal Comment --  CHL IP CERVICAL ESOPHAGEAL PHASE 01/23/2019 Cervical Esophageal Phase WFL Pudding Teaspoon -- Pudding Cup -- Honey Teaspoon -- Honey Cup -- Nectar Teaspoon -- Nectar Cup -- Nectar Straw -- Thin Teaspoon -- Thin Cup -- Thin Straw -- Puree -- Mechanical Soft -- Regular -- Multi-consistency -- Pill -- Cervical Esophageal Comment -- Virl Axe Nix 01/23/2019, 2:32 PM  [name], M.A. CCC-SLP Acute Rehabilitation Services Pager [phone] Office [phone]              Tracheostomy and decannulation PEG tube placement  Subjective: Patient seen and examined on the day of discharge.  No overnight events.  He has no  complaints today.  He is looking forward to go to a skilled rehab mainly for his wound care.   Discharge Exam: Vitals:   02/20/19 0439 02/20/19 1502  BP: 135/66 130/62  Pulse: 85 75  Resp: 16 18  Temp: 98.7 F (37.1 C) 98.5 F (36.9 C)  SpO2: (!) 88% 98%    Vitals:   02/19/19 2113 02/20/19 0439 02/20/19 0609 02/20/19 1502  BP: 111/66 135/66  130/62  Pulse: 73 85  75  Resp: 16 16  18   Temp: 98.4 F (36.9 C) 98.7 F (37.1 C)  98.5 F (36.9 C)  TempSrc: Oral Oral  Oral  SpO2: 97% (!) 88%  98%  Weight:   86.2 kg   Height:        General: Pt is alert, awake, not in acute distress Patient is on room air.  His trach is decannulated.  Wound is healing well. Cardiovascular: RRR, S1/S2 +, no rubs, no gallops Respiratory: CTA bilaterally, no wheezing, no rhonchi Abdominal: Soft, NT, ND, bowel sounds +, PEG tube intact and dry.  Not using. Extremities: no edema, no cyanosis, Left above-knee amputation and stump clean and dry.    The results of significant diagnostics from this hospitalization (including imaging, microbiology, ancillary and laboratory) are listed below for reference.     Microbiology: No results found for this or any previous visit (from the past 240 hour(s)).   Labs: BNP (last 3 results) Recent Labs    11/28/18 1659 12/17/18 0506 01/09/19 0300  BNP 113.0* 48.7 65.0   Basic Metabolic Panel: Recent Labs  Lab 02/15/19 0455  NA 136  K 4.0  CL 97*  CO2 30  GLUCOSE 130*  BUN 25*  CREATININE 0.59*  CALCIUM 9.1   Liver Function Tests: No results for input(s): AST, ALT, ALKPHOS, BILITOT, PROT, ALBUMIN in the last 168 hours. No results for input(s): LIPASE, AMYLASE in the last 168 hours. No results for input(s): AMMONIA in the last 168 hours. CBC: Recent Labs  Lab 02/15/19 0455  WBC 10.1  HGB 11.3*  HCT 36.2*  MCV 86.2  PLT 264   Cardiac Enzymes: No results for input(s): CKTOTAL, CKMB, CKMBINDEX, TROPONINI in the last 168 hours. BNP: Invalid input(s): POCBNP CBG: Recent Labs  Lab 02/19/19 1156 02/19/19 1658 02/19/19 2114 02/20/19 0935 02/20/19 1209  GLUCAP 94 119* 168* 122* 133*   D-Dimer No results for input(s): DDIMER in the last 72 hours. Hgb A1c No results for input(s): HGBA1C in the  last 72 hours. Lipid Profile No results for input(s): CHOL, HDL, LDLCALC, TRIG, CHOLHDL, LDLDIRECT in the last 72 hours. Thyroid function studies No results for input(s): TSH, T4TOTAL, T3FREE, THYROIDAB in the last 72 hours.  Invalid input(s): FREET3 Anemia work up No results for input(s): VITAMINB12, FOLATE, FERRITIN, TIBC, IRON, RETICCTPCT in the last 72 hours. Urinalysis    Component Value Date/Time   COLORURINE YELLOW 02/04/2019 1400   APPEARANCEUR HAZY (A) 02/04/2019 1400   LABSPEC 1.028 02/04/2019 1400   PHURINE 5.0 02/04/2019 1400   GLUCOSEU 150 (A) 02/04/2019 1400   HGBUR NEGATIVE 02/04/2019 1400   BILIRUBINUR NEGATIVE 02/04/2019 1400   KETONESUR NEGATIVE 02/04/2019 1400   PROTEINUR 100 (A) 02/04/2019 1400   NITRITE NEGATIVE 02/04/2019 1400   LEUKOCYTESUR NEGATIVE 02/04/2019 1400   Sepsis Labs Invalid input(s): PROCALCITONIN,  WBC,  LACTICIDVEN Microbiology No results found for this or any previous visit (from the past 240 hour(s)).   Time coordinating discharge:  32 minutes  SIGNED:   Lyndel SafeKuber  Shaquill Iseman, MD  [facility] 02/20/2019, 3:13 PM Pager [phone]  If 7PM-7AM, please contact night-coverage [email or web address] Password [email or web address]

## 2019-02-20 NOTE — Plan of Care (Signed)
  Problem: Education: Goal: Knowledge of General Education information will improve Description: Including pain rating scale, medication(s)/side effects and non-pharmacologic comfort measures Outcome: Progressing   Problem: Activity: Goal: Risk for activity intolerance will decrease Outcome: Progressing   Problem: Nutrition: Goal: Adequate nutrition will be maintained Outcome: Progressing   

## 2019-02-20 NOTE — TOC Transition Note (Addendum)
Transition of Care [facility]) - CM/SW Discharge Note   Patient Details  Name: Antonio [name]. MRN: [id number] Date of Birth: [birth year]  Transition of Care [facility]) CM/SW Contact:  Mearl Latin, LCSW Phone Number: 02/20/2019, 3:40 PM   Clinical Narrative:    Patient will DC to: [place] Anticipated DC date: 02/20/19 Family notified: Son, [name] Transport by: Sharin Mons 6:15pm   Per MD patient ready for DC to [facility]. RN, patient, patient's family, and facility notified of DC. Discharge Summary and FL2 sent to facility. RN to call report prior to discharge [phone] Room 108). DC packet on chart. Ambulance transport requested for patient.   CSW will sign off for now as social work intervention is no longer needed. Please consult [place] again if new needs arise.  Cristobal Goldmann, LCSW Clinical Social Worker [phone]    Final next level of care: Skilled Nursing Facility Barriers to Discharge: No Barriers Identified   Patient Goals and CMS Choice Patient states their goals for this hospitalization and ongoing recovery are:: Rehab CMS Medicare.gov Compare Post Acute Care list provided to:: Patient Choice offered to / list presented to : Patient  Discharge Placement   Existing PASRR number confirmed : 02/20/19          Patient chooses bed at: [facility] Patient to be transferred to facility by: [facility] Name of family member notified: [name], son Patient and family notified of of transfer: 02/20/19  Discharge Plan and Services In-house Referral: Clinical Social Work Discharge Planning Services: [employer] Consult Post Acute Care Choice: Skilled Nursing Facility          DME Arranged: N/A DME Agency: NA HH Arranged: NA HH Agency: NA   Social Determinants of Health (SDOH) Interventions     Readmission Risk Interventions No flowsheet data found.

## 2019-02-20 NOTE — Progress Notes (Signed)
PROGRESS NOTE        PATIENT DETAILS Name: Antonio [name]. Age: 63 y.o. Sex: male Date of Birth: [birth year] Admit Date: 11/28/2018 Admitting Physician Reyes Ivan, MD HVF:MBBU, Orie Rout, NP  Brief Narrative: Patient is a 63 y.o. male admitted on 1/3 with influenza A causing acute hypoxic respiratory failure, cardiogenic shock-required prolonged mechanical ventilation-is currently s/p tracheostomy and PEG tube placement. He has improved now appropriately and waiting to go to SNF for wound care and PT  Significant events: 1/03  Admit, Influenza A positive 1/06  Self extubated  1/07  Re-intubated  1/08  Self-extubated  1/16 tracheostomy 1/21  Trach revision to XLT distal 1/28 PEG tube 2/03  Weaned 15 minutes  2/11  ATC  2/14  Trach changed to #4, mucus plugging >back to #5 XLT 2/15  Failed ATC with increased WOB 2/16  Failed ATC with increased WOB  2/21 back on pressure support 2/24 back on TC  01/27/19 - 5 XLT cuffless trach  01/29/2019: Size 4 cuff less tracheostomy placed 01/30/2019: Capping trials initiated 02/02/2019: Tracheostomy decannulated  Subjective: No major issues overnight-no chest pain or shortness of breath.   Patient has no complaints.  He is eager to get out of the hospital, however has not found any place to go. Blood sugars are stable now.   Assessment/Plan:   Stage IV sacral/coccygeal ulceration: Continue with hydrotherapy while inpatient-general surgery and wound care following.  Recent MRI lumbar spine negative for osteomyelitis.  Defer further to general surgery.  Acute hypoxic respiratory failure secondary to influenza A, pseudomonal/Corynebacterium pneumonia: Prolonged hospitalization requiring prolonged ventilator support-subsequently tracheostomy on 1/16.  Currently decannulated on 3/9-Per last PCCM note-if the wound does not close in 2 weeks-may need evaluation for tracheocutaneous fistula. Stable now.  Acute  systolic heart failure (EF 20-25% on 1/4, EF 60-65% by TTE on 1/29): Compensated-continue beta-blocker.  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  Cardiology recommending further work-up to be done in the outpatient setting.  Dysphagia: Speech therapy following-recommendations were for a dysphagia 2 diet-however patient noncompliant-and as a result SLP has upgraded to a regular diet.  No signs of aspiration at this point.  If continues to tolerate diet well-suspect we can contemplate removing PEG tube in the next few days. On regular diet.  Metabolic encephalopathy: Resolved.  Etiology felt to be secondary to hypoxia/pneumonia. Normal now.  DM-2: Patient had hypoglycemic episodes, decreased doses of total insulin and currently remains a stable.     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  Chronic pain syndrome: Continue Nucynta and as needed oxycodone.  Debility/deconditioning: Secondary to acute illness-awaiting SNF bed. Social worker on board.  DVT Prophylaxis: Prophylactic Lovenox   Code Status: Full code  Family Communication: None at bedside  Disposition Plan: Remain inpatient-SNF on discharge. Medically stable.   Antimicrobial agents: Anti-infectives (From admission, onward)   Start     Dose/Rate Route Frequency Ordered Stop   02/04/19 1300  vancomycin (VANCOCIN) 1,250 mg in sodium chloride 0.9 % 250 mL IVPB  Status:  Discontinued     1,250 mg 166.7 mL/hr over 90 Minutes Intravenous Every 12 hours 02/04/19 1200 02/06/19 1002   02/04/19 1200  metroNIDAZOLE (FLAGYL) IVPB 500 mg  Status:  Discontinued     500 mg 100 mL/hr over 60 Minutes Intravenous  Every 8 hours 02/04/19 1146 02/06/19 1002   02/04/19 0930  ceFEPIme (MAXIPIME) 2 g in sodium chloride 0.9 % 100 mL IVPB     2 g 200 mL/hr over 30 Minutes Intravenous Every 8 hours 02/04/19 0926 02/08/19 2359   02/03/19 1200  meropenem (MERREM) 1 g in sodium chloride  0.9 % 100 mL IVPB  Status:  Discontinued     1 g 200 mL/hr over 30 Minutes Intravenous Every 8 hours 02/03/19 1054 02/04/19 0924   01/19/19 1400  cefTAZidime (FORTAZ) 2 g in sodium chloride 0.9 % 100 mL IVPB     2 g 200 mL/hr over 30 Minutes Intravenous Every 8 hours 01/19/19 1120 01/24/19 2359   01/15/19 1500  cefTAZidime (FORTAZ) 2 g in sodium chloride 0.9 % 100 mL IVPB     2 g 200 mL/hr over 30 Minutes Intravenous Every 8 hours 01/15/19 1448 01/19/19 0542   01/14/19 1400  colistimethate ((COLYMYCIN-M)) nebulized solution 150 mg  Status:  Discontinued     150 mg Inhalation 3 times daily 01/14/19 1142 01/19/19 1119   01/14/19 1300  meropenem (MERREM) 1 g in sodium chloride 0.9 % 100 mL IVPB  Status:  Discontinued     1 g 200 mL/hr over 30 Minutes Intravenous Every 8 hours 01/14/19 1235 01/15/19 1448   12/23/18 1506  vancomycin (VANCOCIN) 1-5 GM/200ML-% IVPB    Note to Pharmacy:  [facility]   : cabinet override      12/23/18 1506 12/23/18 1515   12/20/18 2200  vancomycin (VANCOCIN) IVPB 1000 mg/200 mL premix  Status:  Discontinued     1,000 mg 200 mL/hr over 60 Minutes Intravenous Every 12 hours 12/20/18 1804 12/22/18 1413   12/18/18 0800  ceFEPIme (MAXIPIME) 1 g in sodium chloride 0.9 % 100 mL IVPB  Status:  Discontinued     1 g 200 mL/hr over 30 Minutes Intravenous Every 8 hours 12/18/18 0713 12/19/18 1201   12/17/18 0530  vancomycin (VANCOCIN) 1,500 mg in sodium chloride 0.9 % 500 mL IVPB  Status:  Discontinued     1,500 mg 250 mL/hr over 120 Minutes Intravenous Every 12 hours 12/16/18 1704 12/20/18 1804   12/16/18 1730  cefTAZidime (FORTAZ) 2 g in sodium chloride 0.9 % 100 mL IVPB  Status:  Discontinued     2 g 200 mL/hr over 30 Minutes Intravenous Every 8 hours 12/16/18 1704 12/18/18 0702   12/16/18 1730  vancomycin (VANCOCIN) 2,000 mg in sodium chloride 0.9 % 500 mL IVPB     2,000 mg 250 mL/hr over 120 Minutes Intravenous  Once 12/16/18 1704 12/16/18 2030   12/07/18 1300   erythromycin 500 mg in sodium chloride 0.9 % 100 mL IVPB  Status:  Discontinued     500 mg 100 mL/hr over 60 Minutes Intravenous Every 6 hours 12/07/18 1118 12/16/18 1627   12/06/18 1500  vancomycin (VANCOCIN) 1,250 mg in sodium chloride 0.9 % 250 mL IVPB  Status:  Discontinued     1,250 mg 166.7 mL/hr over 90 Minutes Intravenous Every 12 hours 12/06/18 1436 12/07/18 1118   12/06/18 0100  vancomycin (VANCOCIN) 1,250 mg in sodium chloride 0.9 % 250 mL IVPB     1,250 mg 166.7 mL/hr over 90 Minutes Intravenous  Once 12/05/18 1054 12/06/18 0146   12/05/18 1200  vancomycin (VANCOCIN) 2,000 mg in sodium chloride 0.9 % 500 mL IVPB     2,000 mg 250 mL/hr over 120 Minutes Intravenous  Once 12/05/18 1054 12/05/18 1350  12/05/18 1200  cefTAZidime (FORTAZ) 1 g in sodium chloride 0.9 % 100 mL IVPB  Status:  Discontinued     1 g 200 mL/hr over 30 Minutes Intravenous Every 8 hours 12/05/18 1106 12/07/18 1118   11/30/18 2200  oseltamivir (TAMIFLU) 6 MG/ML suspension 30 mg     30 mg Per Tube 2 times daily 11/30/18 1348 12/03/18 2159   11/30/18 2200  ceFEPIme (MAXIPIME) 1 g in sodium chloride 0.9 % 100 mL IVPB  Status:  Discontinued     1 g 200 mL/hr over 30 Minutes Intravenous Every 12 hours 11/30/18 1350 12/01/18 1039   11/29/18 2200  ceFEPIme (MAXIPIME) 2 g in sodium chloride 0.9 % 100 mL IVPB  Status:  Discontinued     2 g 200 mL/hr over 30 Minutes Intravenous Every 12 hours 11/29/18 1645 11/30/18 1350   11/29/18 1100  oseltamivir (TAMIFLU) 6 MG/ML suspension 75 mg  Status:  Discontinued     75 mg Per Tube 2 times daily 11/29/18 1000 11/30/18 1348   11/28/18 2300  oseltamivir (TAMIFLU) capsule 75 mg  Status:  Discontinued     75 mg Oral 2 times daily 11/28/18 2238 11/29/18 1000   11/28/18 2030  oseltamivir (TAMIFLU) capsule 75 mg     75 mg Oral  Once 11/28/18 2029 11/28/18 2207   11/28/18 1745  vancomycin (VANCOCIN) 2,000 mg in sodium chloride 0.9 % 500 mL IVPB     2,000 mg 250 mL/hr over 120  Minutes Intravenous  Once 11/28/18 1741 11/28/18 2203   11/28/18 1745  piperacillin-tazobactam (ZOSYN) IVPB 3.375 g     3.375 g 100 mL/hr over 30 Minutes Intravenous  Once 11/28/18 1741 11/28/18 1834      Procedures: See above  CONSULTS:  cardiology, pulmonary/intensive care and general surgery  Time spent: 15 minutes  MEDICATIONS: Scheduled Meds:  arformoterol  15 mcg Nebulization BID   aspirin  81 mg Per Tube Daily   atorvastatin  40 mg Per Tube q1800   budesonide (PULMICORT) nebulizer solution  0.5 mg Nebulization BID   carvedilol  3.125 mg Per Tube BID WC   collagenase   Topical Daily   enoxaparin (LOVENOX) injection  40 mg Subcutaneous Q24H   famotidine  20 mg Per Tube BID   folic acid  1 mg Per Tube Daily   guaiFENesin  5 mL Per Tube Q12H   insulin aspart  0-20 Units Subcutaneous TID WC   insulin detemir  15 Units Subcutaneous BID   multivitamin with minerals  1 tablet Oral Daily   nutrition supplement (JUVEN)  1 packet Oral BID BM   polyethylene glycol  17 g Per Tube Daily   tapentadol  50 mg Oral QID   thiamine  100 mg Per Tube Daily   Continuous Infusions:  sodium chloride 10 mL/hr at 01/24/19 2000   PRN Meds:.sodium chloride, acetaminophen (TYLENOL) oral liquid 160 mg/5 mL, albuterol, alum & mag hydroxide-simeth **AND** lidocaine, bisacodyl, clonazePAM, docusate, ipratropium-albuterol, oxyCODONE, Resource ThickenUp Clear, senna   PHYSICAL EXAM: Vital signs: Vitals:   02/19/19 2052 02/19/19 2113 02/20/19 0439 02/20/19 0609  BP:  111/66 135/66   Pulse:  73 85   Resp:  16 16   Temp:  98.4 F (36.9 C) 98.7 F (37.1 C)   TempSrc:  Oral Oral   SpO2: 93% 97% (!) 88%   Weight:    86.2 kg  Height:       Filed Weights   02/13/19 0436 02/19/19 0457 02/20/19 1610  Weight: 63 kg 88 kg 86.2 kg   Body mass index is 25.77 kg/m.   General appearance :Awake, alert, not in any distress.  HEENT: Atraumatic and Normocephalic Resp:Good air entry  bilaterally, no added sounds  CVS: S1 S2 regular, no murmurs.  GI: Bowel sounds present, Non tender and not distended with no gaurding, rigidity or rebound.No organomegaly Extremities: Left AKA Neurology: Nonfocal Musculoskeletal:No digital cyanosis Skin:No Rash, warm and dry  I have personally reviewed following labs and imaging studies  LABORATORY DATA: CBC: Recent Labs  Lab 02/15/19 0455  WBC 10.1  HGB 11.3*  HCT 36.2*  MCV 86.2  PLT 264    Basic Metabolic Panel: Recent Labs  Lab 02/15/19 0455  NA 136  K 4.0  CL 97*  CO2 30  GLUCOSE 130*  BUN 25*  CREATININE 0.59*  CALCIUM 9.1    GFR: Estimated Creatinine Clearance: 106.4 mL/min (A) (by C-G formula based on SCr of 0.59 mg/dL (L)).  Liver Function Tests: No results for input(s): AST, ALT, ALKPHOS, BILITOT, PROT, ALBUMIN in the last 168 hours. No results for input(s): LIPASE, AMYLASE in the last 168 hours. No results for input(s): AMMONIA in the last 168 hours.  Coagulation Profile: No results for input(s): INR, PROTIME in the last 168 hours.  Cardiac Enzymes: No results for input(s): CKTOTAL, CKMB, CKMBINDEX, TROPONINI in the last 168 hours.  BNP (last 3 results) No results for input(s): PROBNP in the last 8760 hours.  HbA1C: No results for input(s): HGBA1C in the last 72 hours.  CBG: Recent Labs  Lab 02/19/19 0748 02/19/19 1156 02/19/19 1658 02/19/19 2114 02/20/19 0935  GLUCAP 77 94 119* 168* 122*    Lipid Profile: No results for input(s): CHOL, HDL, LDLCALC, TRIG, CHOLHDL, LDLDIRECT in the last 72 hours.  Thyroid Function Tests: No results for input(s): TSH, T4TOTAL, FREET4, T3FREE, THYROIDAB in the last 72 hours.  Anemia Panel: No results for input(s): VITAMINB12, FOLATE, FERRITIN, TIBC, IRON, RETICCTPCT in the last 72 hours.  Urine analysis:    Component Value Date/Time   COLORURINE YELLOW 02/04/2019 1400   APPEARANCEUR HAZY (A) 02/04/2019 1400   LABSPEC 1.028 02/04/2019 1400    PHURINE 5.0 02/04/2019 1400   GLUCOSEU 150 (A) 02/04/2019 1400   HGBUR NEGATIVE 02/04/2019 1400   BILIRUBINUR NEGATIVE 02/04/2019 1400   KETONESUR NEGATIVE 02/04/2019 1400   PROTEINUR 100 (A) 02/04/2019 1400   NITRITE NEGATIVE 02/04/2019 1400   LEUKOCYTESUR NEGATIVE 02/04/2019 1400    Sepsis Labs: Lactic Acid, Venous    Component Value Date/Time   LATICACIDVEN 1.3 01/09/2019 0620    MICROBIOLOGY: No results found for this or any previous visit (from the past 240 hour(s)).  RADIOLOGY STUDIES/RESULTS: Mr Lumbar Spine W Wo Contrast  Result Date: 02/04/2019 CLINICAL DATA:  Coccygeal wound infection. EXAM: MRI LUMBAR SPINE WITHOUT AND WITH CONTRAST TECHNIQUE: Multiplanar and multiecho pulse sequences of the lumbar spine were obtained without and with intravenous contrast. CONTRAST:  7 mL Gadavist COMPARISON:  None. FINDINGS: Segmentation: Normal. The lowest disc space is considered to be L5-S1. Alignment:  Normal Vertebrae: There is no bone marrow edema. No compression fracture. No abnormal contrast enhancement. Normal T1-weighted signal is preserved throughout the visualized spine and sacrum. The coccyx is outside of the field of view. Conus medullaris and cauda equina: The conus medullaris terminates at the L1 level. The cauda equina and conus medullaris are both normal. Paraspinal and other soft tissues: The visualized retroperitoneal organs and paraspinal soft tissues are normal. Disc levels: Sagittal  plane imaging includes the T12-L1 disc level through the upper sacrum, with axial imaging of the L1-2 to L5-S1 disc levels. T12-L1: Normal. L1-2: Normal. L2-3: Disc desiccation with small bulge and superimposed central protrusion. No central spinal canal stenosis. No neural foraminal stenosis. Normal facets. L3-4: Disc desiccation and small central protrusion. No spinal canal or neural foraminal stenosis. L4-5: Minimal disc bulge no spinal canal or neural foraminal stenosis. L5-S1: Central/right  subarticular disc protrusion with narrowing of the right lateral recess. No central spinal canal stenosis or neural foraminal stenosis. The visualized portion of the sacrum is normal. IMPRESSION: 1. No evidence of active osteomyelitis of the lumbar spine or sacrum. Please note that the coccyx is outside the field of view. 2. No fluid collection or other focal abnormality of the soft tissues dorsal to the spine. 3. Multilevel mild degenerative disc disease. Mild narrowing of the right L5-S1 lateral recess could cause radicular symptoms in a right S1 distribution. Electronically Signed   By: Deatra Robinson M.D.   On: 02/04/2019 23:06   Dg Chest Port 1 View  Result Date: 02/04/2019 CLINICAL DATA:  Dyspnea. EXAM: PORTABLE CHEST 1 VIEW COMPARISON:  One-view chest x-ray 02/03/2019 FINDINGS: The heart is mildly enlarged. Mild pulmonary vascular congestion is stable. Elevation of the right hemidiaphragm is again noted. There is some volume loss on the right with associated atelectasis. No significant airspace consolidation is present. A small right effusion is not excluded. IMPRESSION: 1. Mild pulmonary vascular congestion without frank edema. 2. Similar appearance of right basilar atelectasis. Electronically Signed   By: Marin Roberts M.D.   On: 02/04/2019 08:31   Dg Chest Port 1 View  Result Date: 02/03/2019 CLINICAL DATA:  Shortness of breath EXAM: PORTABLE CHEST 1 VIEW COMPARISON:  01/19/2019 FINDINGS: Cardiac shadows within normal limits. Elevation of the right hemidiaphragm is noted with right basilar atelectasis. No other focal infiltrate is seen. Gastrostomy tube is noted within the stomach. IMPRESSION: Mild right basilar atelectasis. Electronically Signed   By: Alcide Clever M.D.   On: 02/03/2019 08:24   Dg Swallowing Func-speech Pathology  Result Date: 02/04/2019 Objective Swallowing Evaluation: Type of Study: MBS-Modified Barium Swallow Study  Patient Details Name: Amel Gianino. MRN:  [id number] Date of Birth: 1956/08/31 Today's Date: 02/04/2019 Time: SLP Start Time (ACUTE ONLY): 0920 -SLP Stop Time (ACUTE ONLY): 0931 SLP Time Calculation (min) (ACUTE ONLY): 11 min Past Medical History: Past Medical History: Diagnosis Date  Coronary artery disease   Diabetes mellitus without complication (HCC)   Hyperlipidemia   Hypertension   Osteomyelitis (HCC) 03/25/2017  RT FOOT Past Surgical History: Past Surgical History: Procedure Laterality Date  AMPUTATION Right 03/27/2017  Procedure: 1st Ray Amputation Right Foot;  Surgeon: Nadara Mustard, MD;  Location: [facility] OR;  Service: Orthopedics;  Laterality: Right;  APPENDECTOMY    BELOW KNEE LEG AMPUTATION Left   CARDIAC CATHETERIZATION    CORONARY STENT INTERVENTION  2005  Great toe amputation right.    I&D EXTREMITY Left 12/30/2015  Procedure: IRRIGATION AND DEBRIDEMENT LEFT FOOT, TRANSMETATARSAL AMPUTATION WITH APPLICATION OF ANTIBIOTIC BEADS AND WOUND VAC;  Surgeon: Nadara Mustard, MD;  Location: [facility] OR;  Service: Orthopedics;  Laterality: Left;  IR GASTROSTOMY TUBE MOD SED  12/23/2018  TONSILLECTOMY    TRACHEOSTOMY  11/2018 HPI: 63 year old male coming in with fever cough chest pain and shortness of breath was found to have influenza A. PMH: diabetes mellitus, hypertension.  Intubated multiple times 1/3 to 1/16 with trach placed 1/16. Repeat MBS  on 3/28 for possible initiation of po's. This is pt's 3rd MBS. Plan to repeat to determine upgrade initially; on 3/10 concern pt may have aspirated and made NPO.   Subjective: pt is alert and cooperative but needs encouragement Assessment / Plan / Recommendation CHL IP CLINICAL IMPRESSIONS 02/04/2019 Clinical Impression Pt has been decannulated since 3/9 with evidence of incomplete stoma closure indicated by breathy vocal quality. He continues to exhibit moderate-severe pharyngeal dysphagia noteably due to incompetent laryngeal closure and poor retroflexion of epiglottis. Honey thick penetrated vestibule with  initial swallow of MBS and expelled with cues for cough and was not penetrated with subsequent trial. Nectar thick entered vestibule with penetration aspiration score (PAS) of 3, 5 & 7 (did not exit trachea with cough). Chin tuck actually facilitated entrance of barium into trachea and swallow with head in neutral applying pressure to gauze/stoma to increase pressure was ineffective. Mild-mod amount of vallecular and pyriform sinus residue not significantly improved. Recommend pt resume po's of honey thick liquids, texture of Dys 2, multiple swallows and hard coughs/throat clear after every other bite/sip. Pt should continue to receive ST at skilled nursing facility for continued intervention and repeat MBS when appropriate (not before 6-8 weeks). Will continue to follow while here.  SLP Visit Diagnosis Dysphagia, pharyngeal phase (R13.13) Attention and concentration deficit following -- Frontal lobe and executive function deficit following -- Impact on safety and function Severe aspiration risk   CHL IP TREATMENT RECOMMENDATION 02/04/2019 Treatment Recommendations Therapy as outlined in treatment plan below   Prognosis 02/04/2019 Prognosis for Safe Diet Advancement Good Barriers to Reach Goals -- Barriers/Prognosis Comment -- CHL IP DIET RECOMMENDATION 02/04/2019 SLP Diet Recommendations Dysphagia 2 (Fine chop) solids;Honey thick liquids Liquid Administration via Cup;No straw Medication Administration Whole meds with puree Compensations Slow rate;Small sips/bites;Multiple dry swallows after each bite/sip;Hard cough after swallow;Clear throat intermittently Postural Changes Seated upright at 90 degrees;Remain semi-upright after after feeds/meals (Comment)   CHL IP OTHER RECOMMENDATIONS 02/04/2019 Recommended Consults -- Oral Care Recommendations Oral care BID Other Recommendations --   CHL IP FOLLOW UP RECOMMENDATIONS 02/04/2019 Follow up Recommendations Skilled Nursing facility   [facility] IP FREQUENCY AND DURATION 02/04/2019  Speech Therapy Frequency (ACUTE ONLY) min 2x/week Treatment Duration 2 weeks      CHL IP ORAL PHASE 02/04/2019 Oral Phase WFL Oral - Pudding Teaspoon -- Oral - Pudding Cup -- Oral - Honey Teaspoon -- Oral - Honey Cup WFL Oral - Nectar Teaspoon -- Oral - Nectar Cup WFL Oral - Nectar Straw -- Oral - Thin Teaspoon -- Oral - Thin Cup NT Oral - Thin Straw -- Oral - Puree NT Oral - Mech Soft -- Oral - Regular NT Oral - Multi-Consistency -- Oral - Pill -- Oral Phase - Comment --  CHL IP PHARYNGEAL PHASE 02/04/2019 Pharyngeal Phase Impaired Pharyngeal- Pudding Teaspoon -- Pharyngeal -- Pharyngeal- Pudding Cup -- Pharyngeal -- Pharyngeal- Honey Teaspoon NT Pharyngeal -- Pharyngeal- Honey Cup Penetration/Aspiration during swallow;Pharyngeal residue - valleculae;Pharyngeal residue - pyriform;Reduced airway/laryngeal closure Pharyngeal Material enters airway, remains ABOVE vocal cords and not ejected out Pharyngeal- Nectar Teaspoon NT Pharyngeal -- Pharyngeal- Nectar Cup Penetration/Aspiration during swallow;Reduced airway/laryngeal closure;Pharyngeal residue - valleculae;Pharyngeal residue - pyriform Pharyngeal Material enters airway, passes BELOW cords and not ejected out despite cough attempt by patient;Material enters airway, CONTACTS cords and not ejected out Pharyngeal- Nectar Straw -- Pharyngeal -- Pharyngeal- Thin Teaspoon NT Pharyngeal -- Pharyngeal- Thin Cup NT Pharyngeal -- Pharyngeal- Thin Straw -- Pharyngeal -- Pharyngeal- Puree NT Pharyngeal -- Pharyngeal-  Mechanical Soft -- Pharyngeal -- Pharyngeal- Regular NT Pharyngeal -- Pharyngeal- Multi-consistency -- Pharyngeal -- Pharyngeal- Pill -- Pharyngeal -- Pharyngeal Comment --  CHL IP CERVICAL ESOPHAGEAL PHASE 02/04/2019 Cervical Esophageal Phase WFL Pudding Teaspoon -- Pudding Cup -- Honey Teaspoon -- Honey Cup -- Nectar Teaspoon -- Nectar Cup -- Nectar Straw -- Thin Teaspoon -- Thin Cup -- Thin Straw -- Puree -- Mechanical Soft -- Regular -- Multi-consistency --  Pill -- Cervical Esophageal Comment -- Royce MacadamiaLitaker, Lisa Willis 02/04/2019, 10:23 AM Breck CoonsLisa Willis Lonell FaceLitaker M.Ed Sports administratorCCC-SLP Speech-Language Pathologist Pager [phone] Office [phone]              Dg Swallowing Func-speech Pathology  Result Date: 01/30/2019 Objective Swallowing Evaluation: Type of Study: MBS-Modified Barium Swallow Study  Patient Details Name: Norvel RichardsDaniel Watson Croson Jr. MRN: [id number] Date of Birth: Mar 29, 1956 Today's Date: 01/30/2019 Time: SLP Start Time (ACUTE ONLY): 1328 -SLP Stop Time (ACUTE ONLY): 1348 SLP Time Calculation (min) (ACUTE ONLY): 20 min Past Medical History: Past Medical History: Diagnosis Date  Coronary artery disease   Diabetes mellitus without complication (HCC)   Hyperlipidemia   Hypertension   Osteomyelitis (HCC) 03/25/2017  RT FOOT Past Surgical History: Past Surgical History: Procedure Laterality Date  AMPUTATION Right 03/27/2017  Procedure: 1st Ray Amputation Right Foot;  Surgeon: Nadara MustardMarcus Duda V, MD;  [facility];  Service: Orthopedics;  Laterality: Right;  APPENDECTOMY    BELOW KNEE LEG AMPUTATION Left   CARDIAC CATHETERIZATION    CORONARY STENT INTERVENTION  2005  Great toe amputation right.    I&D EXTREMITY Left 12/30/2015  Procedure: IRRIGATION AND DEBRIDEMENT LEFT FOOT, TRANSMETATARSAL AMPUTATION WITH APPLICATION OF ANTIBIOTIC BEADS AND WOUND VAC;  Surgeon: Nadara MustardMarcus Duda V, MD;  Location: [facility] OR;  Service: Orthopedics;  Laterality: Left;  IR GASTROSTOMY TUBE MOD SED  12/23/2018  TONSILLECTOMY    TRACHEOSTOMY  11/2018 HPI: 63 year old male coming in with fever cough chest pain and shortness of breath was found to have influenza A. PMH: diabetes mellitus, hypertension.  Intubated multiple times 1/3 to 1/16 with trach placed 1/16. Repeat MBS on 3/28 for possible initiation of po's.  Subjective: pt is alert and cooperative but needs encouragement Assessment / Plan / Recommendation CHL IP CLINICAL IMPRESSIONS 01/30/2019 Clinical Impression Pt's trach was capped this morning and  during MBS with normal oropharyngeal pressures. Swallow function has improved from prior study despite silent aspiration with thin and penetration of nectar and honey. Laryngeal excursion, pharyngeal contraction and epiglottic inversion are not of normal strength and ROM however improved from last week. Incomplete closure led to penetration with thin, nectar and only flash with honey thick x 1. Chin tuck did not prevent penetration (silent) with nectar and vallecular and pyriform sinus residue present and was mildly increased with honey thick. Verbal cues to perform second swallow reduced volume. Puree was not penetrated this study. Recommend Dys 2 given mild oral residue, impulsivity and deconditioning. Honey thick liquids recommended with two swallows, intermittent throat clears and pills whole in applesauce. ST will continue to follow for safety and upgrade of po textures. SLP Visit Diagnosis Dysphagia, oropharyngeal phase (R13.12) Attention and concentration deficit following -- Frontal lobe and executive function deficit following -- Impact on safety and function Moderate aspiration risk;Severe aspiration risk   CHL IP TREATMENT RECOMMENDATION 01/30/2019 Treatment Recommendations Therapy as outlined in treatment plan below   Prognosis 01/30/2019 Prognosis for Safe Diet Advancement Good Barriers to Reach Goals -- Barriers/Prognosis Comment -- CHL IP DIET RECOMMENDATION 01/30/2019 SLP Diet Recommendations Honey thick  liquids;Dysphagia 2 (Fine chop) solids Liquid Administration via Cup;No straw Medication Administration Whole meds with puree Compensations Slow rate;Small sips/bites;Multiple dry swallows after each bite/sip;Clear throat intermittently Postural Changes Seated upright at 90 degrees   CHL IP OTHER RECOMMENDATIONS 01/30/2019 Recommended Consults -- Oral Care Recommendations Oral care BID Other Recommendations --   CHL IP FOLLOW UP RECOMMENDATIONS 01/30/2019 Follow up Recommendations (No Data)   CHL IP FREQUENCY  AND DURATION 01/30/2019 Speech Therapy Frequency (ACUTE ONLY) min 2x/week Treatment Duration 2 weeks      CHL IP ORAL PHASE 01/30/2019 Oral Phase Impaired Oral - Pudding Teaspoon -- Oral - Pudding Cup -- Oral - Honey Teaspoon -- Oral - Honey Cup Lingual/palatal residue Oral - Nectar Teaspoon -- Oral - Nectar Cup Lingual/palatal residue Oral - Nectar Straw -- Oral - Thin Teaspoon -- Oral - Thin Cup -- Oral - Thin Straw -- Oral - Puree Lingual/palatal residue Oral - Mech Soft -- Oral - Regular WFL Oral - Multi-Consistency -- Oral - Pill -- Oral Phase - Comment --  CHL IP PHARYNGEAL PHASE 01/30/2019 Pharyngeal Phase Impaired Pharyngeal- Pudding Teaspoon -- Pharyngeal -- Pharyngeal- Pudding Cup -- Pharyngeal -- Pharyngeal- Honey Teaspoon NT Pharyngeal -- Pharyngeal- Honey Cup Pharyngeal residue - valleculae;Pharyngeal residue - pyriform;Reduced epiglottic inversion;Penetration/Aspiration during swallow;Reduced airway/laryngeal closure Pharyngeal Material enters airway, remains ABOVE vocal cords then ejected out Pharyngeal- Nectar Teaspoon NT Pharyngeal -- Pharyngeal- Nectar Cup Penetration/Aspiration during swallow;Reduced airway/laryngeal closure;Reduced epiglottic inversion;Pharyngeal residue - valleculae;Pharyngeal residue - pyriform;Compensatory strategies attempted (with notebox) Pharyngeal Material enters airway, remains ABOVE vocal cords and not ejected out Pharyngeal- Nectar Straw -- Pharyngeal -- Pharyngeal- Thin Teaspoon NT Pharyngeal -- Pharyngeal- Thin Cup Penetration/Aspiration during swallow;Pharyngeal residue - valleculae;Pharyngeal residue - pyriform;Moderate aspiration;Trace aspiration;Reduced airway/laryngeal closure;Reduced epiglottic inversion Pharyngeal Material enters airway, passes BELOW cords without attempt by patient to eject out (silent aspiration) Pharyngeal- Thin Straw -- Pharyngeal -- Pharyngeal- Puree Pharyngeal residue - pyriform;Pharyngeal residue - valleculae;Reduced epiglottic inversion  Pharyngeal Material does not enter airway Pharyngeal- Mechanical Soft -- Pharyngeal -- Pharyngeal- Regular WFL Pharyngeal -- Pharyngeal- Multi-consistency -- Pharyngeal -- Pharyngeal- Pill -- Pharyngeal -- Pharyngeal Comment --  CHL IP CERVICAL ESOPHAGEAL PHASE 01/30/2019 Cervical Esophageal Phase WFL Pudding Teaspoon -- Pudding Cup -- Honey Teaspoon -- Honey Cup -- Nectar Teaspoon -- Nectar Cup -- Nectar Straw -- Thin Teaspoon -- Thin Cup -- Thin Straw -- Puree -- Mechanical Soft -- Regular -- Multi-consistency -- Pill -- Cervical Esophageal Comment -- Royce Macadamia 01/30/2019, 2:53 PM Breck Coons Litaker [employer] Sports administrator Pager [phone] Office [phone]              Dg Swallowing Func-speech Pathology  Result Date: 01/23/2019 Objective Swallowing Evaluation: Type of Study: MBS-Modified Barium Swallow Study  Patient Details Name: Eastyn Dattilo. MRN: [id number] Date of Birth: 04-Aug-1956 Today's Date: 01/23/2019 Time: SLP Start Time (ACUTE ONLY): 1254 -SLP Stop Time (ACUTE ONLY): 1308 SLP Time Calculation (min) (ACUTE ONLY): 14 min Past Medical History: Past Medical History: Diagnosis Date  Coronary artery disease   Diabetes mellitus without complication (HCC)   Hyperlipidemia   Hypertension   Osteomyelitis (HCC) 03/25/2017  RT FOOT Past Surgical History: Past Surgical History: Procedure Laterality Date  AMPUTATION Right 03/27/2017  Procedure: 1st Ray Amputation Right Foot;  Surgeon: Nadara Mustard, MD;  Location: [facility] OR;  Service: Orthopedics;  Laterality: Right;  APPENDECTOMY    BELOW KNEE LEG AMPUTATION Left   CARDIAC CATHETERIZATION    CORONARY STENT INTERVENTION  2005  Great toe  amputation right.    I&D EXTREMITY Left 12/30/2015  Procedure: IRRIGATION AND DEBRIDEMENT LEFT FOOT, TRANSMETATARSAL AMPUTATION WITH APPLICATION OF ANTIBIOTIC BEADS AND WOUND VAC;  Surgeon: Nadara Mustard, MD;  Location: [facility] OR;  Service: Orthopedics;  Laterality: Left;  IR GASTROSTOMY TUBE MOD SED   12/23/2018  TONSILLECTOMY    TRACHEOSTOMY  11/2018 HPI: 63 year old male coming in with fever cough chest pain and shortness of breath was found to have influenza A. PMH: diabetes mellitus, hypertension.  Intubated multiple times 1/3 to 1/16 with trach placed 1/16.   Subjective: pt is alert and cooperative but needs encouragement Assessment / Plan / Recommendation CHL IP CLINICAL IMPRESSIONS 01/23/2019 Clinical Impression Pt has a moderate pharyngeal dysphagia due to reduced strength and airway closure s/p prolonged (and repeated) intubations. His timing for swallow trigger is good, but he has reduced hyolaryngeal movement, pharyngeal squeeze, epiglottic inversion, and tonge base retraction. Thin and nectar thick liquids enter the laryngeal vestibule during the swallow because of his incomplete closure, with penetration intermittently reaching the true vocal folds without spontaneous cough elicited. Multiple cued coughs were required to eject penetrates. Honey thick liquids and purees left more residue in the valleculae and pyriform sinuses, with intermittent penetration after the swallow from the residual material. Recommend he remain NPO except for initiating small amounts of purees and spoonfuls of thickened liquids during SLP visits only to facilitate restrengthening. Pt may also benefit from EMST and other exercises to progress strength of cough and pharyngeal musculature.   SLP Visit Diagnosis Dysphagia, pharyngeal phase (R13.13) Attention and concentration deficit following -- Frontal lobe and executive function deficit following -- Impact on safety and function Moderate aspiration risk   CHL IP TREATMENT RECOMMENDATION 01/23/2019 Treatment Recommendations Therapy as outlined in treatment plan below   Prognosis 01/23/2019 Prognosis for Safe Diet Advancement Good Barriers to Reach Goals -- Barriers/Prognosis Comment -- CHL IP DIET RECOMMENDATION 01/23/2019 SLP Diet Recommendations NPO Liquid Administration via --  Medication Administration Via alternative means Compensations -- Postural Changes --   CHL IP OTHER RECOMMENDATIONS 01/23/2019 Recommended Consults -- Oral Care Recommendations Oral care QID Other Recommendations Have oral suction available   CHL IP FOLLOW UP RECOMMENDATIONS 01/23/2019 Follow up Recommendations Inpatient Rehab   CHL IP FREQUENCY AND DURATION 01/23/2019 Speech Therapy Frequency (ACUTE ONLY) min 2x/week Treatment Duration 2 weeks      CHL IP ORAL PHASE 01/23/2019 Oral Phase WFL Oral - Pudding Teaspoon -- Oral - Pudding Cup -- Oral - Honey Teaspoon -- Oral - Honey Cup -- Oral - Nectar Teaspoon -- Oral - Nectar Cup -- Oral - Nectar Straw -- Oral - Thin Teaspoon -- Oral - Thin Cup -- Oral - Thin Straw -- Oral - Puree -- Oral - Mech Soft -- Oral - Regular -- Oral - Multi-Consistency -- Oral - Pill -- Oral Phase - Comment --  CHL IP PHARYNGEAL PHASE 01/23/2019 Pharyngeal Phase Impaired Pharyngeal- Pudding Teaspoon -- Pharyngeal -- Pharyngeal- Pudding Cup -- Pharyngeal -- Pharyngeal- Honey Teaspoon Reduced pharyngeal peristalsis;Reduced epiglottic inversion;Reduced anterior laryngeal mobility;Reduced laryngeal elevation;Reduced airway/laryngeal closure;Reduced tongue base retraction;Pharyngeal residue - valleculae;Pharyngeal residue - pyriform Pharyngeal -- Pharyngeal- Honey Cup -- Pharyngeal -- Pharyngeal- Nectar Teaspoon Reduced pharyngeal peristalsis;Reduced epiglottic inversion;Reduced anterior laryngeal mobility;Reduced laryngeal elevation;Reduced airway/laryngeal closure;Reduced tongue base retraction;Pharyngeal residue - valleculae;Pharyngeal residue - pyriform;Penetration/Aspiration during swallow Pharyngeal Material enters airway, remains ABOVE vocal cords and not ejected out Pharyngeal- Nectar Cup Reduced pharyngeal peristalsis;Reduced epiglottic inversion;Reduced anterior laryngeal mobility;Reduced laryngeal elevation;Reduced airway/laryngeal closure;Reduced tongue base retraction;Pharyngeal residue -  valleculae;Pharyngeal residue - pyriform;Penetration/Aspiration during swallow Pharyngeal Material enters airway, remains ABOVE vocal cords and not ejected out Pharyngeal- Nectar Straw -- Pharyngeal -- Pharyngeal- Thin Teaspoon Reduced pharyngeal peristalsis;Reduced epiglottic inversion;Reduced anterior laryngeal mobility;Reduced laryngeal elevation;Reduced airway/laryngeal closure;Reduced tongue base retraction;Penetration/Aspiration during swallow Pharyngeal Material enters airway, CONTACTS cords and not ejected out Pharyngeal- Thin Cup -- Pharyngeal -- Pharyngeal- Thin Straw -- Pharyngeal -- Pharyngeal- Puree Reduced pharyngeal peristalsis;Reduced epiglottic inversion;Reduced anterior laryngeal mobility;Reduced laryngeal elevation;Reduced airway/laryngeal closure;Reduced tongue base retraction;Pharyngeal residue - valleculae;Pharyngeal residue - pyriform;Penetration/Apiration after swallow Pharyngeal Material enters airway, remains ABOVE vocal cords and not ejected out Pharyngeal- Mechanical Soft -- Pharyngeal -- Pharyngeal- Regular -- Pharyngeal -- Pharyngeal- Multi-consistency -- Pharyngeal -- Pharyngeal- Pill -- Pharyngeal -- Pharyngeal Comment --  CHL IP CERVICAL ESOPHAGEAL PHASE 01/23/2019 Cervical Esophageal Phase WFL Pudding Teaspoon -- Pudding Cup -- Honey Teaspoon -- Honey Cup -- Nectar Teaspoon -- Nectar Cup -- Nectar Straw -- Thin Teaspoon -- Thin Cup -- Thin Straw -- Puree -- Mechanical Soft -- Regular -- Multi-consistency -- Pill -- Cervical Esophageal Comment -- Virl Axe Nix 01/23/2019, 2:32 PM  [name], M.A. CCC-SLP Acute Rehabilitation Services Pager [phone] Office [phone]               LOS: 84 days   Dorcas Carrow, MD  [facility]  If 7PM-7AM, please contact night-coverage  Please page via [email or web address]  Go to [email or web address] and use [facility]'s universal password to access. If you do not have the password, please contact the hospital operator.  Locate the [facility] provider  you are looking for under [facility] and page to a number that you can be directly reached. If you still have difficulty reaching the provider, please page the [facility] (Director on Call) for the Hospitalists listed on amion for assistance.  02/20/2019, 10:50 AM

## 2019-02-20 NOTE — Final Consult Note (Signed)
Consultant Final Sign-Off Note    Assessment/Final recommendations  Antonio [name]. is a 63 y.o. male followed by me for Sacral Wound.  Wound care (if applicable): Continue Hydrotherapy while inpatient and BID WTD packing/dressing changes with Santyl. Continue placing Microgaurd powder around wound. Would need BID WTD packing/dressing changes at dispo.    Diet at discharge: per primary team   Activity at discharge: per primary team   Follow-up appointment:  PRN   Pending results:  Unresulted Labs (From admission, onward)   None       Medication recommendations: As above   Other recommendations:     Thank you for allowing [place] to participate in the care of your patient!  Please consult [place] again if you have further needs for your patient.  Elmer Sow [facility] 02/20/2019 10:48 AM    Subjective   Undergoing hydrotherapy. There is much less drainage and foul odor on todays dressing. Patient reports he has been allowing nurses to change his packing now.   Objective  Vital signs in last 24 hours: Temp:  [98.4 F (36.9 C)-98.7 F (37.1 C)] 98.7 F (37.1 C) (03/27 0439) Pulse Rate:  [73-85] 85 (03/27 0439) Resp:  [16] 16 (03/27 0439) BP: (111-135)/(66) 135/66 (03/27 0439) SpO2:  [88 %-97 %] 88 % (03/27 0439) Weight:  [86.2 kg] 86.2 kg (03/27 0609)  PE: Gen: Alert, NAD HEENT: EOM's intact, pupils equal and round Pulm:effort normal GU:See picture below. Sacral wound with improvement of the amount of drainage on dressing that was removed. Some slough remains at proximal edge of wound, but is overall improving. Wound tunnels about4cm at 11 o'clock position, and about 8cm at 8 o'clock position. Surrounding erythema much less with use of antifungal power     Pertinent labs and Studies: No results for input(s): WBC, HGB, HCT in the last 72 hours. BMET No results for input(s): NA, K, CL, CO2, GLUCOSE, BUN, CREATININE, CALCIUM in the last 72 hours. No results for  input(s): LABURIN in the last 72 hours. Results for orders placed or performed during the hospital encounter of 11/28/18  Respiratory Panel by PCR     Status: Abnormal   Collection Time: 11/28/18  5:21 PM  Result Value Ref Range Status   Adenovirus NOT DETECTED NOT DETECTED Final   Coronavirus 229E NOT DETECTED NOT DETECTED Final   Coronavirus HKU1 NOT DETECTED NOT DETECTED Final   Coronavirus NL63 NOT DETECTED NOT DETECTED Final   Coronavirus OC43 NOT DETECTED NOT DETECTED Final   Metapneumovirus NOT DETECTED NOT DETECTED Final   Rhinovirus / Enterovirus NOT DETECTED NOT DETECTED Final   Influenza A H1 2009 DETECTED (A) NOT DETECTED Final   Influenza B NOT DETECTED NOT DETECTED Final   Parainfluenza Virus 1 NOT DETECTED NOT DETECTED Final   Parainfluenza Virus 2 NOT DETECTED NOT DETECTED Final   Parainfluenza Virus 3 NOT DETECTED NOT DETECTED Final   Parainfluenza Virus 4 NOT DETECTED NOT DETECTED Final   Respiratory Syncytial Virus NOT DETECTED NOT DETECTED Final   Bordetella pertussis NOT DETECTED NOT DETECTED Final   Chlamydophila pneumoniae NOT DETECTED NOT DETECTED Final   Mycoplasma pneumoniae NOT DETECTED NOT DETECTED Final  Blood Culture (routine x 2)     Status: None   Collection Time: 11/28/18  5:24 PM  Result Value Ref Range Status   Specimen Description BLOOD RIGHT ANTECUBITAL  Final   Special Requests   Final    BOTTLES DRAWN AEROBIC AND ANAEROBIC Blood Culture adequate volume  Culture   Final    NO GROWTH 5 DAYS Performed at [facility], [address], [place], [place] [address]    Report Status 12/03/2018 FINAL  Final  Blood Culture (routine x 2)     Status: None   Collection Time: 11/28/18  5:25 PM  Result Value Ref Range Status   Specimen Description BLOOD LEFT ANTECUBITAL  Final   Special Requests   Final    BOTTLES DRAWN AEROBIC AND ANAEROBIC Blood Culture adequate volume   Culture   Final    NO GROWTH 5 DAYS Performed at [facility],  [address] [place], [place] [address]    Report Status 12/03/2018 FINAL  Final  MRSA PCR Screening     Status: None   Collection Time: 11/29/18  4:25 AM  Result Value Ref Range Status   MRSA by PCR NEGATIVE NEGATIVE Final    Comment:        The GeneXpert MRSA Assay (FDA approved for NASAL specimens only), is one component of a comprehensive MRSA colonization surveillance program. It is not intended to diagnose MRSA infection nor to guide or monitor treatment for MRSA infections. Performed at [facility], [address], [place], [place] [address]   Urine culture     Status: None   Collection Time: 11/29/18  9:49 AM  Result Value Ref Range Status   Specimen Description URINE, CLEAN CATCH  Final   Special Requests NONE  Final   Culture   Final    NO GROWTH Performed at [facility], [address], [place], [place] [address]    Report Status 11/30/2018 FINAL  Final  Culture, respiratory (non-expectorated)     Status: None   Collection Time: 12/02/18  7:46 AM  Result Value Ref Range Status   Specimen Description TRACHEAL ASPIRATE  Final   Special Requests NONE  Final   Gram Stain   Final    RARE WBC PRESENT, PREDOMINANTLY PMN FEW GRAM POSITIVE COCCI IN PAIRS IN CLUSTERS RARE GRAM POSITIVE RODS RARE GRAM NEGATIVE RODS    Culture   Final    Consistent with normal respiratory flora. Performed at [facility], [address], [place], [place] [address]    Report Status 12/04/2018 FINAL  Final  Culture, respiratory (non-expectorated)     Status: None   Collection Time: 12/04/18 10:22 AM  Result Value Ref Range Status   Specimen Description BRONCHIAL ALVEOLAR LAVAGE  Final   Special Requests NONE  Final   Gram Stain   Final    ABUNDANT WBC PRESENT, PREDOMINANTLY PMN MODERATE GRAM POSITIVE RODS FEW GRAM POSITIVE COCCI Performed at [facility], [address] [place], [place] [address]    Culture   Final    MODERATE DIPHTHEROIDS(CORYNEBACTERIUM  SPECIES) Standardized susceptibility testing for this organism is not available.    Report Status 12/06/2018 FINAL  Final  Culture, blood (Routine X 2) w Reflex to ID Panel     Status: None   Collection Time: 12/05/18  6:50 AM  Result Value Ref Range Status   Specimen Description BLOOD LEFT ANTECUBITAL  Final   Special Requests   Final    BOTTLES DRAWN AEROBIC AND ANAEROBIC Blood Culture adequate volume   Culture   Final    NO GROWTH 5 DAYS Performed at [facility], [address] [place], [place] [address]    Report Status 12/10/2018 FINAL  Final  Culture, blood (  Routine X 2) w Reflex to ID Panel     Status: None   Collection Time: 12/05/18  6:58 AM  Result Value Ref Range Status   Specimen Description BLOOD LEFT HAND  Final   Special Requests AEROBIC BOTTLE ONLY Blood Culture adequate volume  Final   Culture   Final    NO GROWTH 5 DAYS Performed at [facility], [address] [place], [place] [address]    Report Status 12/10/2018 FINAL  Final  Culture, respiratory (non-expectorated)     Status: None   Collection Time: 12/16/18 12:13 PM  Result Value Ref Range Status   Specimen Description TRACHEAL ASPIRATE  Final   Special Requests NONE  Final   Gram Stain   Final    FEW WBC PRESENT, PREDOMINANTLY PMN ABUNDANT GRAM POSITIVE RODS MODERATE GRAM POSITIVE COCCI IN CLUSTERS IN PAIRS FEW GRAM NEGATIVE RODS    Culture   Final    ABUNDANT CORYNEBACTERIUM STRIATUM Standardized susceptibility testing for this organism is not available. Performed at [facility], [address], [place], [place] [address]    Report Status 12/19/2018 FINAL  Final  MRSA PCR Screening     Status: None   Collection Time: 01/04/19  9:26 PM  Result Value Ref Range Status   MRSA by PCR NEGATIVE NEGATIVE Final    Comment:        The GeneXpert MRSA Assay (FDA approved for NASAL specimens only), is one component of a comprehensive MRSA colonization surveillance program. It is not  intended to diagnose MRSA infection nor to guide or monitor treatment for MRSA infections. Performed at [facility], [address], [place], [place] [address]   Culture, respiratory     Status: None   Collection Time: 01/13/19 11:53 AM  Result Value Ref Range Status   Specimen Description SPUTUM  Final   Special Requests NONE  Final   Gram Stain   Final    FEW WBC PRESENT, PREDOMINANTLY PMN FEW GRAM NEGATIVE RODS Performed at [facility], [address], [place], [place] [address]    Culture MODERATE PSEUDOMONAS AERUGINOSA  Final   Report Status 01/15/2019 FINAL  Final   Organism ID, Bacteria PSEUDOMONAS AERUGINOSA  Final      Susceptibility   Pseudomonas aeruginosa - MIC*    CEFTAZIDIME 4 SENSITIVE Sensitive     CIPROFLOXACIN <=0.25 SENSITIVE Sensitive     GENTAMICIN <=1 SENSITIVE Sensitive     IMIPENEM 2 SENSITIVE Sensitive     PIP/TAZO <=4 SENSITIVE Sensitive     CEFEPIME 2 SENSITIVE Sensitive     * MODERATE PSEUDOMONAS AERUGINOSA  Expectorated sputum assessment w rflx to resp cult     Status: None   Collection Time: 02/04/19 10:44 AM  Result Value Ref Range Status   Specimen Description SPUTUM  Final   Special Requests NONE  Final   Sputum evaluation   Final    THIS SPECIMEN IS ACCEPTABLE FOR SPUTUM CULTURE Performed at [facility], [address], [place], [place] [address]    Report Status 02/04/2019 FINAL  Final  Culture, respiratory     Status: None   Collection Time: 02/04/19 10:44 AM  Result Value Ref Range Status   Specimen Description SPUTUM  Final   Special Requests NONE Reflexed from [id number]  Final   Gram Stain   Final    ABUNDANT WBC PRESENT, PREDOMINANTLY PMN RARE SQUAMOUS EPITHELIAL CELLS PRESENT RARE GRAM POSITIVE RODS Performed at  [facility], [address], [place], [place] [address]    Culture FEW PSEUDOMONAS AERUGINOSA  Final   Report Status 02/07/2019 FINAL  Final   Organism ID, Bacteria PSEUDOMONAS AERUGINOSA   Final      Susceptibility   Pseudomonas aeruginosa - MIC*    CEFTAZIDIME 4 SENSITIVE Sensitive     CIPROFLOXACIN <=0.25 SENSITIVE Sensitive     GENTAMICIN <=1 SENSITIVE Sensitive     IMIPENEM 2 SENSITIVE Sensitive     PIP/TAZO 16 SENSITIVE Sensitive     CEFEPIME 4 SENSITIVE Sensitive     * FEW PSEUDOMONAS AERUGINOSA  Culture, blood (routine x 2)     Status: None   Collection Time: 02/04/19 11:52 AM  Result Value Ref Range Status   Specimen Description BLOOD LEFT ARM  Final   Special Requests   Final    BOTTLES DRAWN AEROBIC ONLY Blood Culture adequate volume   Culture   Final    NO GROWTH 5 DAYS Performed at [facility], [address] [place], [place] [address]    Report Status 02/09/2019 FINAL  Final  Culture, blood (routine x 2)     Status: None   Collection Time: 02/04/19 12:00 PM  Result Value Ref Range Status   Specimen Description BLOOD LEFT ARM  Final   Special Requests   Final    BOTTLES DRAWN AEROBIC ONLY Blood Culture adequate volume   Culture   Final    NO GROWTH 5 DAYS Performed at [facility], [address] [place], [place] [address]    Report Status 02/09/2019 FINAL  Final  Culture, Urine     Status: None   Collection Time: 02/04/19  1:54 PM  Result Value Ref Range Status   Specimen Description URINE, CLEAN CATCH  Final   Special Requests NONE  Final   Culture   Final    NO GROWTH Performed at [facility], [address] [place], [place] [address]    Report Status 02/05/2019 FINAL  Final    Imaging: No results found.

## 2019-02-20 NOTE — Progress Notes (Addendum)
Patient alert and oriented x4 complains of chronic pain to abdomen, provided 10 mg of oxycodone prior to discharge to [place] at 2059 via [facility].  Patient was inquired about pain medication at the facility, informed that physcians ordered 5mg  of oxycodone instead of 10mg , patient educated rationale to decrease necessity for narcotics.  Patient refused head to toe assessment, educated with rationale.  Report called to Carlynn Purl, LPN at [facility] at 2110.  Patient belonging sent x 2 bags, and remains in stable condition at discharge.  Call received from [facility], LPN, around 1660 from [place], she voiced concern that patient had spiked a low grade temperature of 99.75f and had a cough.  She verbalized that patient and EMS stated patient had coughing on way to facility.  Patient inpatient on unit with no complaints of cough during previous shift or my current shift.  Nurse informed that patient could not remain at the facility if he had no been ruled out for covid virus.  Nurse encouraged to triage patient through the emergency department as we could not accept him directly back to unit. She verbalized understanding.  Charge nurse Solmon Ice RN aware of situation.

## 2019-02-20 NOTE — Progress Notes (Signed)
OT Cancellation Note  Patient Details Name: Antonio [name]. MRN: [id number] DOB: 01/31/1956   Cancelled Treatment:    Reason Eval/Treat Not Completed: Patient at procedure or test/ unavailable.  Pt working with PT.  Will reattempt.  Jeani Hawking, OTR/L Acute Rehabilitation Services Pager [phone] Office [phone]   Jeani Hawking M 02/20/2019, 2:36 PM

## 2019-02-20 NOTE — Progress Notes (Signed)
Physical Therapy Treatment Patient Details Name: Antonio [name]. MRN: [id number] DOB: May 14, 1956 Today's Date: 02/20/2019    History of Present Illness Pt is a 63 y.o. male admitted 11/28/18 with flu A and PNA; acute respiratory failure intubated 1/4. Trach placed 1/16. PEG placed 1/28. Hospital course complicared by acute MI, CHF, cardiogenic shock, and mucous plugging. Also with sacral wound.  De-cannulated 02/02/19. PMH includes DM, HTN, HLD, CAD, L BKA, R transmet amputation.    PT Comments    Pt is progressing well with his gait and mobility.  He is limited in his self care (per pt) due to R UE (dominant hand) pain and dysfunction.  PT/OT will continue to address together, but moving forward, PT will specifically work towards goal of ambulation with a cane or no AD.  Pt is currently doing well with RW, but did not use and AD PTA.  Pt encouraged to start trying AA shoulder exercises with a cane and try alternating ice/heat on the shoulder for assist in pain management.  His goal is to ultimately go home, but he knows his sacral wound is not going to be easy to manage.  PT will continue to follow acutely for safe mobility progression  Follow Up Recommendations  SNF     Equipment Recommendations  3in1 (PT);Rolling walker with 5" wheels    Recommendations for Other Services   NA     Precautions / Restrictions Precautions Precautions: Fall Precaution Comments: PEG, monitor O2 sats Other Brace: L LE prosthesis    Mobility  Bed Mobility Overal bed mobility: Modified Independent   Rolling: Modified independent (Device/Increase time) Sidelying to sit: Modified independent (Device/Increase time)       General bed mobility comments: Pt able to partially roll bil for me to work on his shoulder in sidelyind and reposition him comfortably at the end of the session.   Transfers                 General transfer comment: Pt declined right now.  Ambulation/Gait              General Gait Details: Pt declined right now             Cognition Arousal/Alertness: Awake/alert Behavior During Therapy: WFL for tasks assessed/performed Overall Cognitive Status: Within Functional Limits for tasks assessed                                 General Comments: Not specifically tested.  He remains irritable, but can be redirected.  Does well with people who take time to build rapport.       Exercises General Exercises - Upper Extremity Shoulder Flexion: Both;5 reps;Supine;AAROM(with cane) Shoulder Flexion Limitations: educated on staying less than 45 degrees and in minimally painful ROM.  Elbow Flexion: AROM;Both;10 reps(with cane) Other Exercises Other Exercises: Cross friction massage (gentle) to bicipital groove with work on mobilizing his scapula in left sidelying.  Pt tolerated well and was encouraged to alternate ice/head on R shoulder after session.  Pt agreeable to heat only at this time.     General Comments General comments (skin integrity, edema, etc.): I had a long discussion about his d/c plan, goals of therapy, and progression from here.  Pt wants to be able to go home, but his parents cannot manage his significant wound care needs at this time.        Pertinent Vitals/Pain Pain Assessment: Faces  Faces Pain Scale: Hurts even more Pain Location: R shoulder and sacral wound Pain Descriptors / Indicators: Discomfort;Grimacing Pain Intervention(s): Monitored during session;Limited activity within patient's tolerance;Repositioned;Heat applied           PT Goals (current goals can now be found in the care plan section) Acute Rehab PT Goals Patient Stated Goal: to go home PT Goal Formulation: With patient Time For Goal Achievement: 03/06/19(mobility goals) Potential to Achieve Goals: Good Progress towards PT goals: Progressing toward goals(goals updated)    Frequency    Min 2X/week      PT Plan Current plan remains  appropriate       AM-PAC PT "6 Clicks" Mobility   Outcome Measure  Help needed turning from your back to your side while in a flat bed without using bedrails?: None Help needed moving from lying on your back to sitting on the side of a flat bed without using bedrails?: None Help needed moving to and from a bed to a chair (including a wheelchair)?: None Help needed standing up from a chair using your arms (e.g., wheelchair or bedside chair)?: A Little Help needed to walk in hospital room?: A Little Help needed climbing 3-5 steps with a railing? : A Little 6 Click Score: 21    End of Session   Activity Tolerance: Patient limited by pain Patient left: in bed;with call bell/phone within reach   PT Visit Diagnosis: Muscle weakness (generalized) (M62.81);Unsteadiness on feet (R26.81);Difficulty in walking, not elsewhere classified (R26.2)     Time: 6962-9528 PT Time Calculation (min) (ACUTE ONLY): 39 min  Charges:  $Therapeutic Exercise: 8-22 mins $Self Care/Home Management: 8-22 $Massage: 8-22 mins                    Elfego Giammarino B. Mattison Golay, PT, DPT  Acute Rehabilitation [phone] pager #[phone] office   02/20/2019, 1:59 PM

## 2020-10-20 IMAGING — DX DG ABD PORTABLE 1V
1 series · 1 of 1 positions shown · non-contrast
Comparison: Recent chest radiograph as well as previous CT
11/28/2018.

CLINICAL DATA: Enteric tube placement.

EXAM:
PORTABLE ABDOMEN - 1 VIEW

[abdomen]
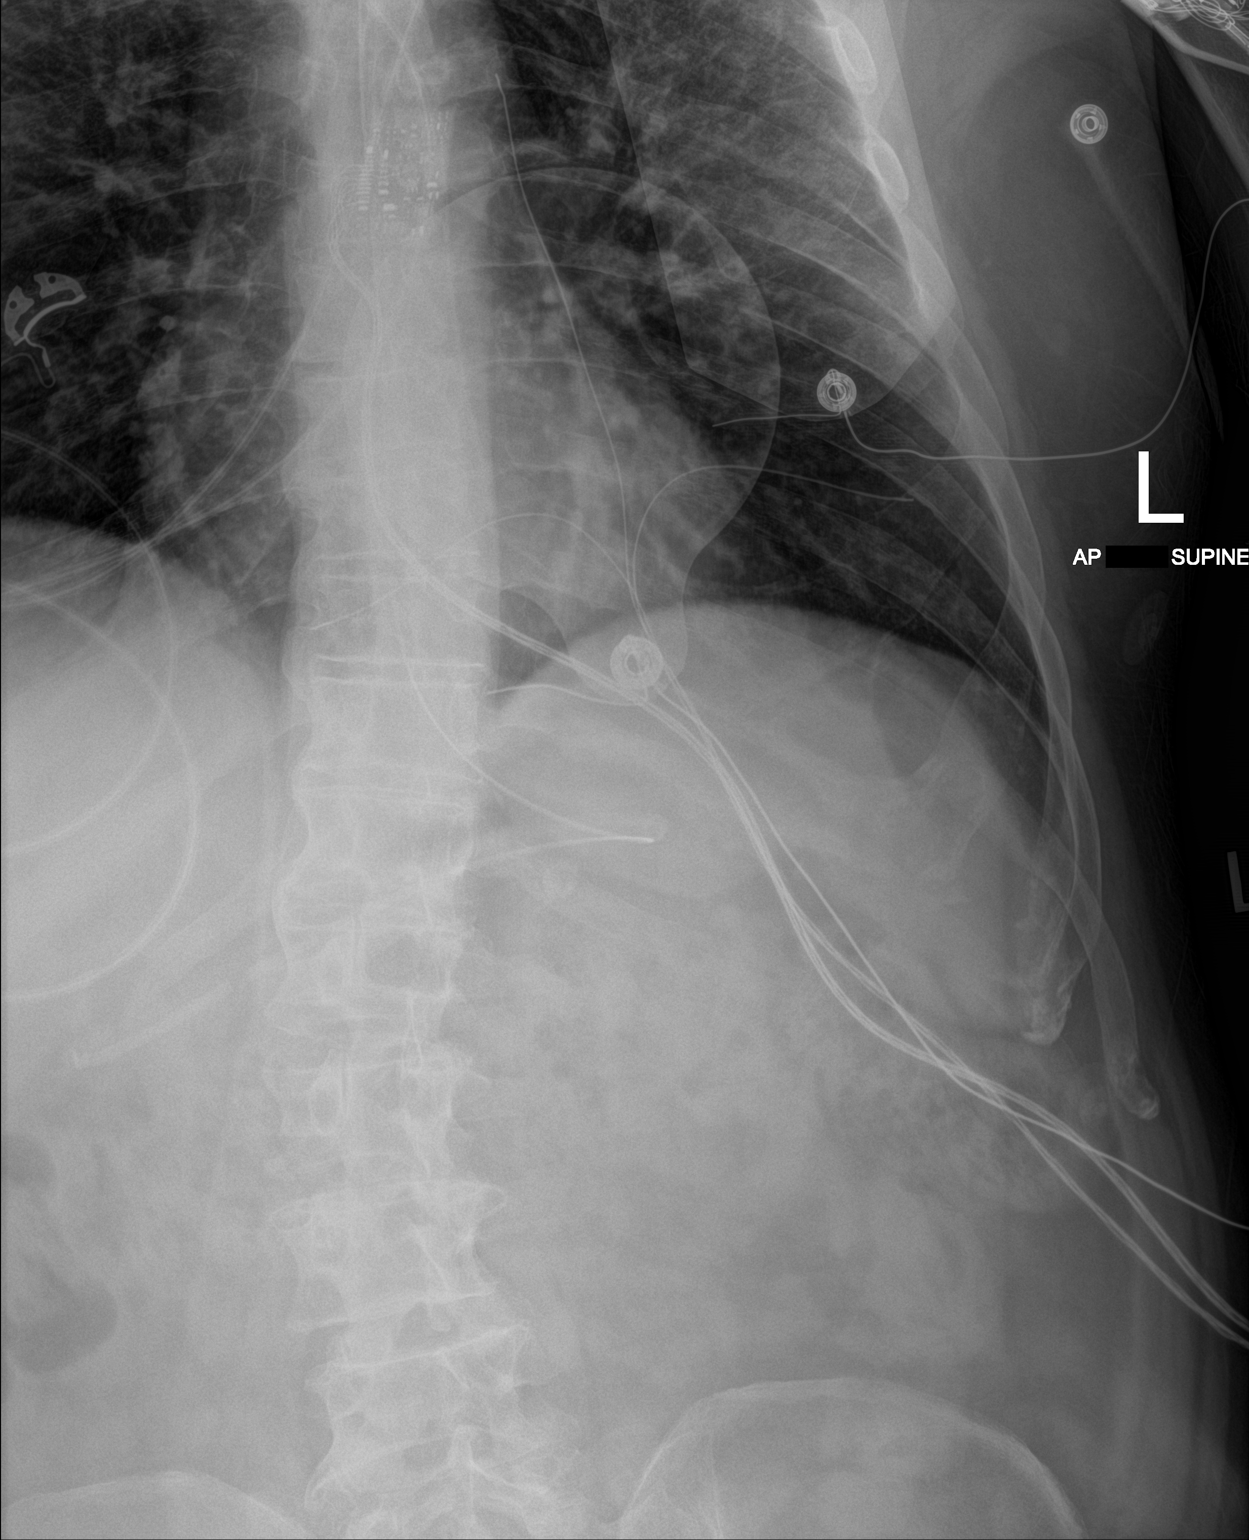

[1 of 1 positions shown; findings below may reference images not displayed]

FINDINGS: Evidence of patient's enteric tube which courses into the stomach as
tip is right of midline likely over the distal stomach or proximal
duodenum. Bowel gas pattern is nonobstructive. No free peritoneal
air. Remainder the exam is unchanged.
IMPRESSION: Enteric tube with tip over the right upper quadrant likely over the
distal stomach or proximal duodenum.

## 2020-10-20 IMAGING — DX DG CHEST 1V PORT
1 series · 2 of 2 positions shown · non-contrast
Comparison: 11/28/2018

CLINICAL DATA: Intubation.

EXAM:
PORTABLE CHEST 1 VIEW

[Series 1: chest · 0.14mm/px · 2 of 2 slices shown]
[im 1/2]
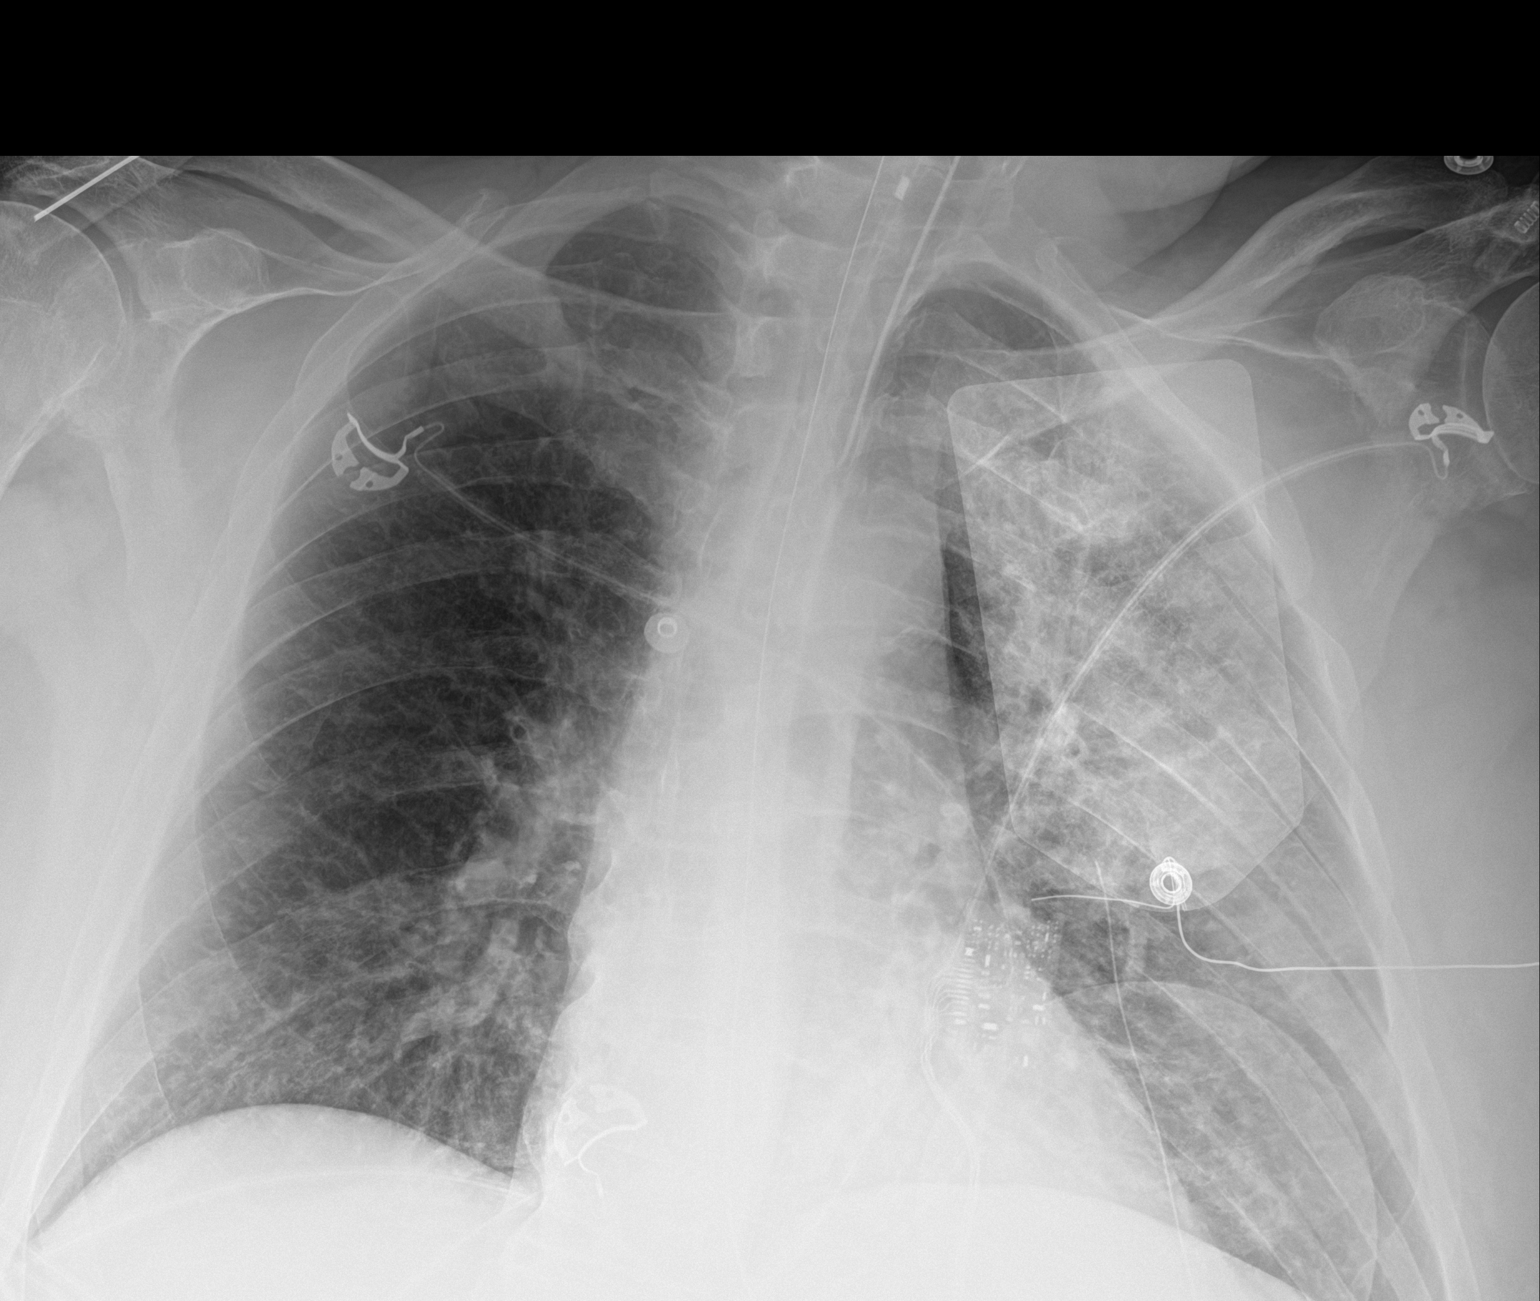
[im 2/2]
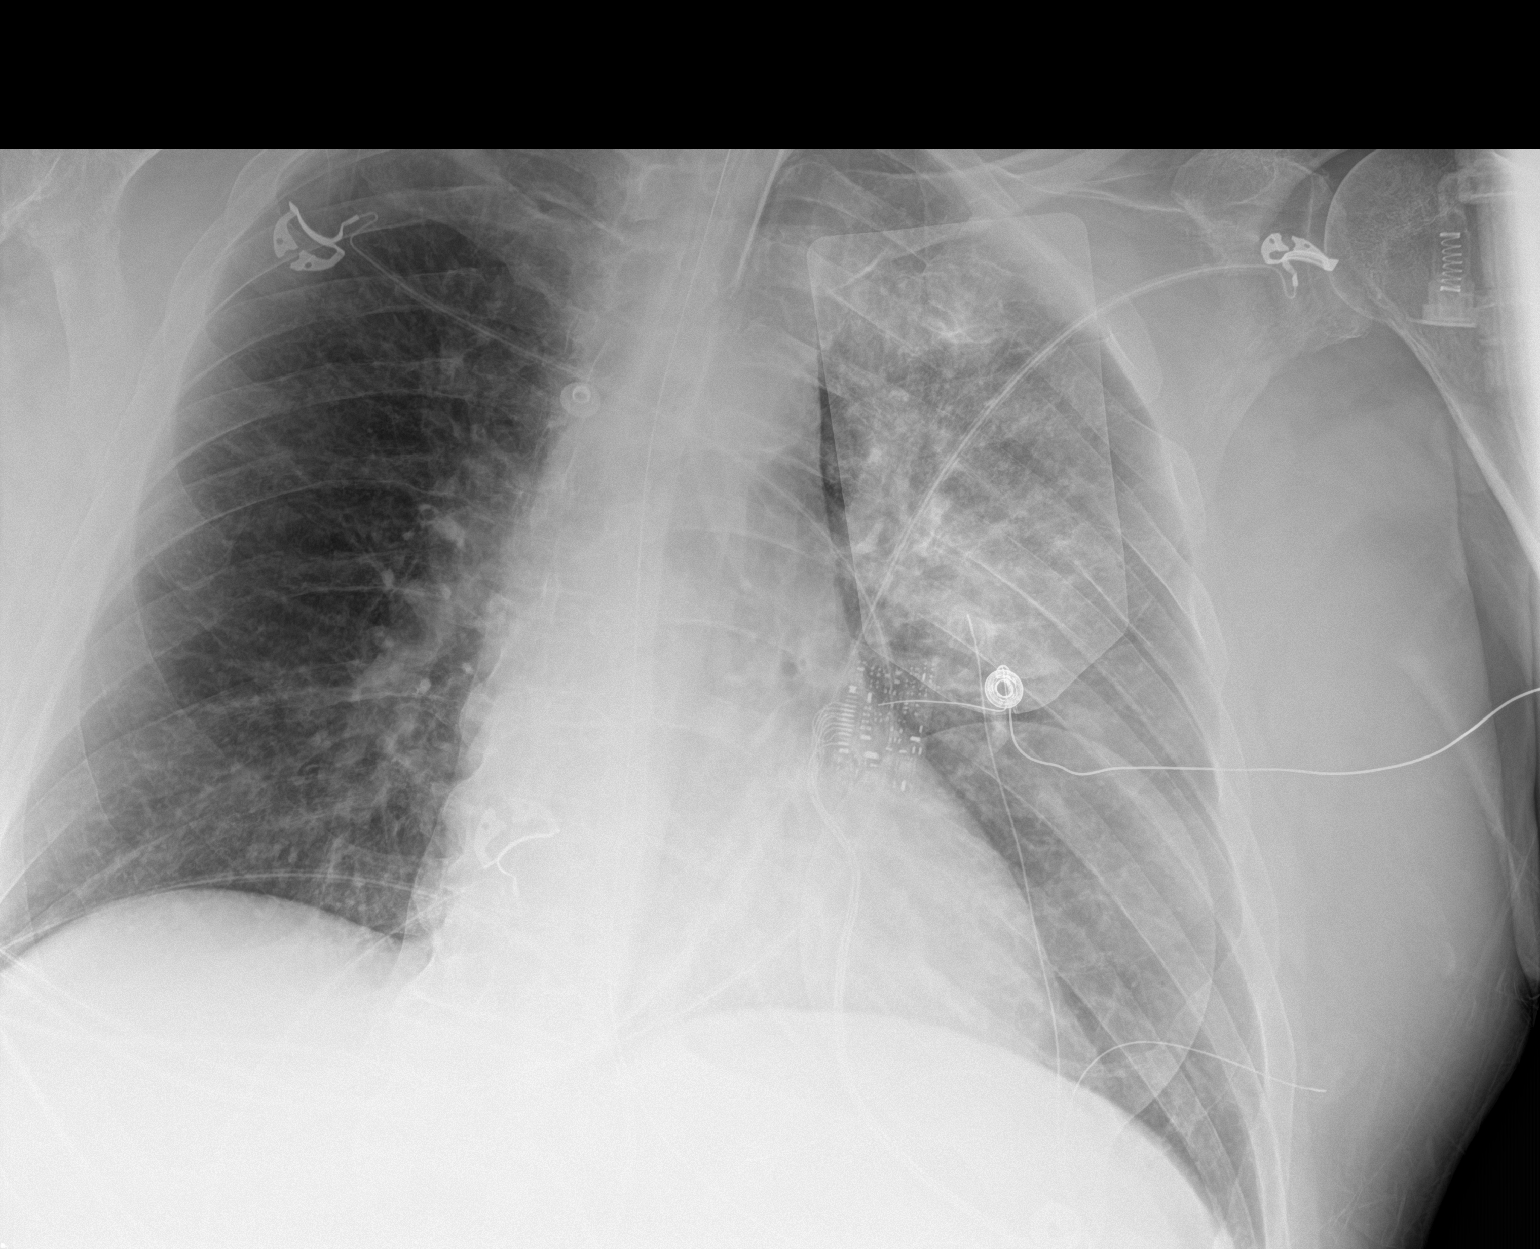

[2 of 2 positions shown; findings below may reference images not displayed]

FINDINGS: Endotracheal tube has tip 5.8 cm above the carina. Nasogastric tube
courses into the region of the stomach and off the inferior portion
of the film as tip is not visualized. Patient is slightly rotated to
the left. Lungs are adequately inflated demonstrate no focal lobar
consolidation or effusion. Cardiomediastinal silhouette and
remainder of the exam is unchanged.
IMPRESSION: No acute findings.

Tubes and lines as described.

## 2020-10-22 IMAGING — DX DG CHEST 1V PORT
2 series · 2 of 2 positions shown · non-contrast
Comparison: Radiograph December 01, 2018.

CLINICAL DATA: Respiratory distress.

EXAM:
PORTABLE CHEST 1 VIEW

[chest ap (1 of 2)]
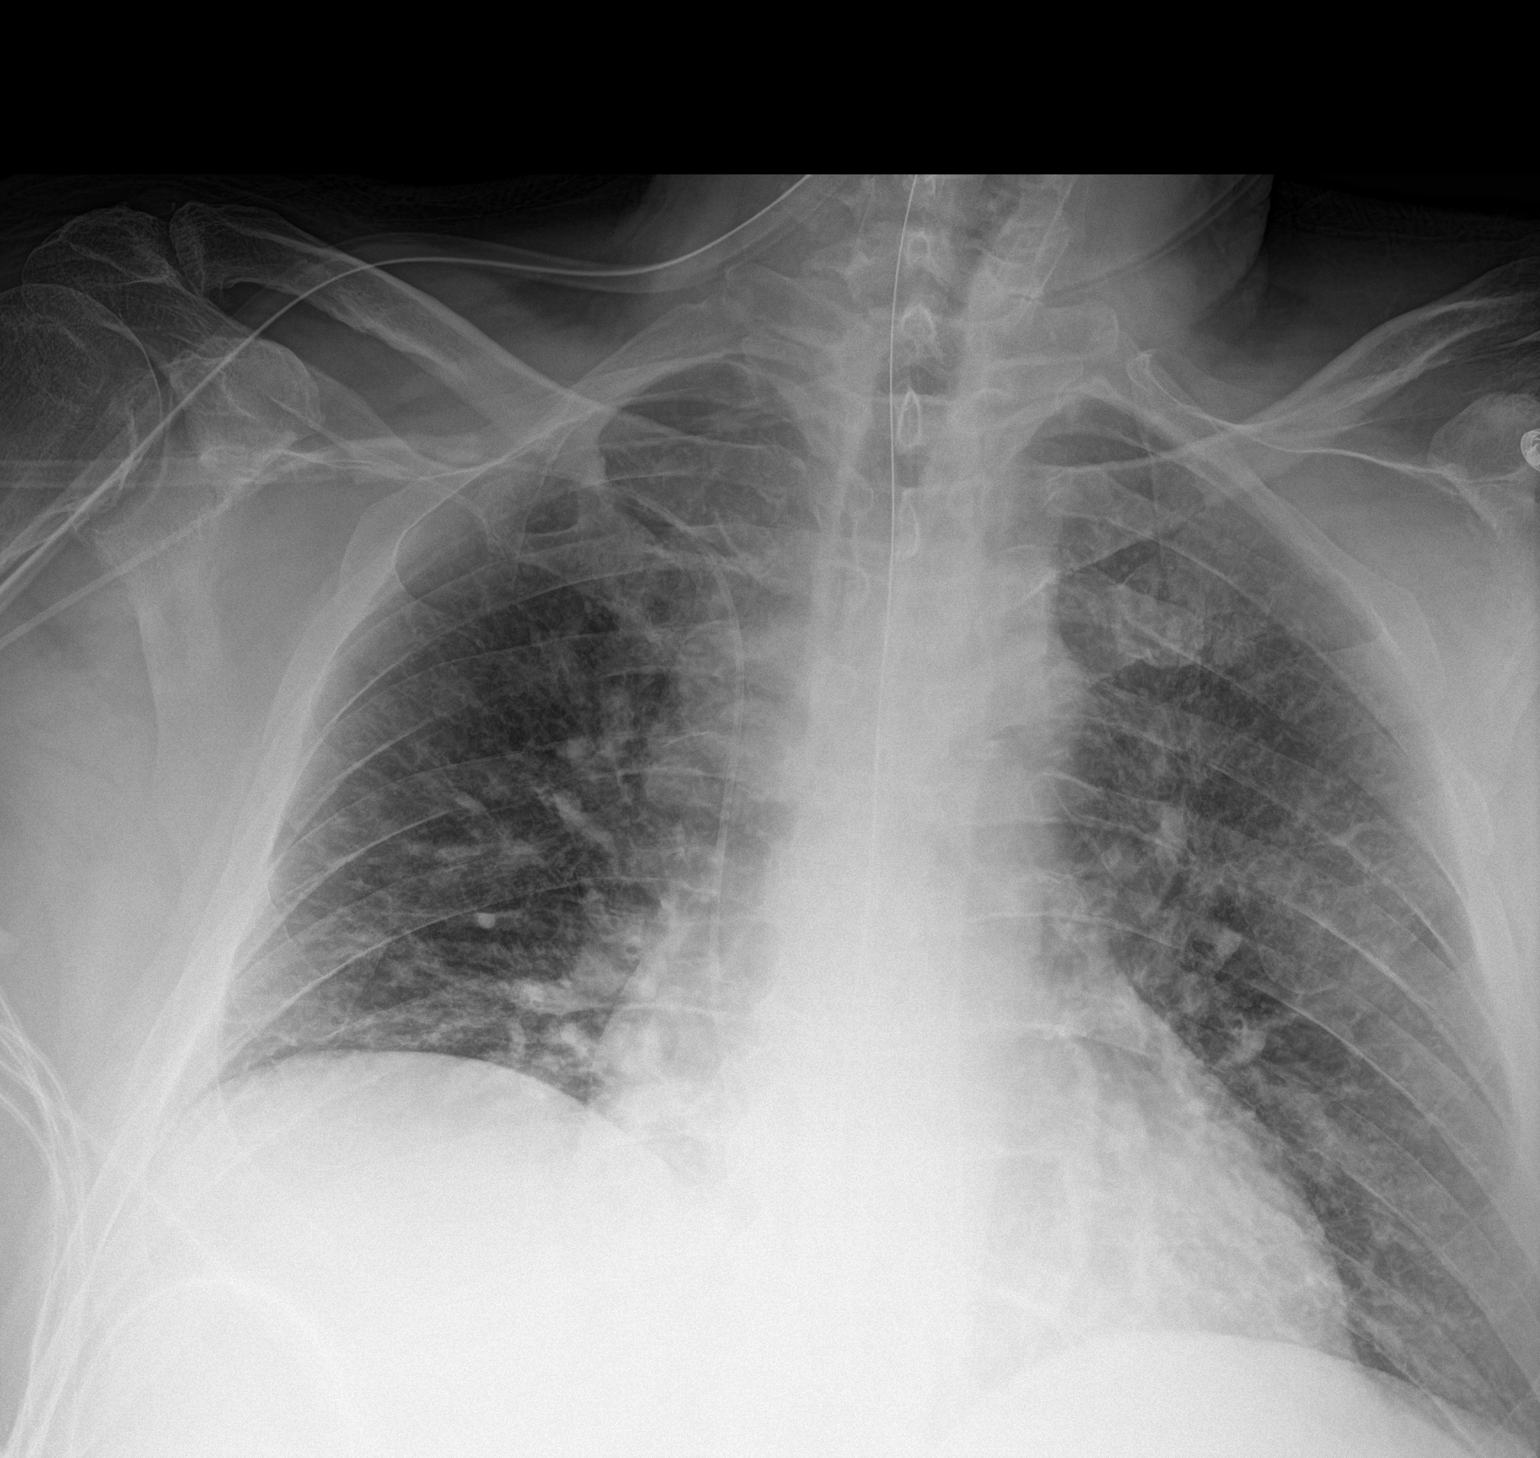

[chest ap (2 of 2)]
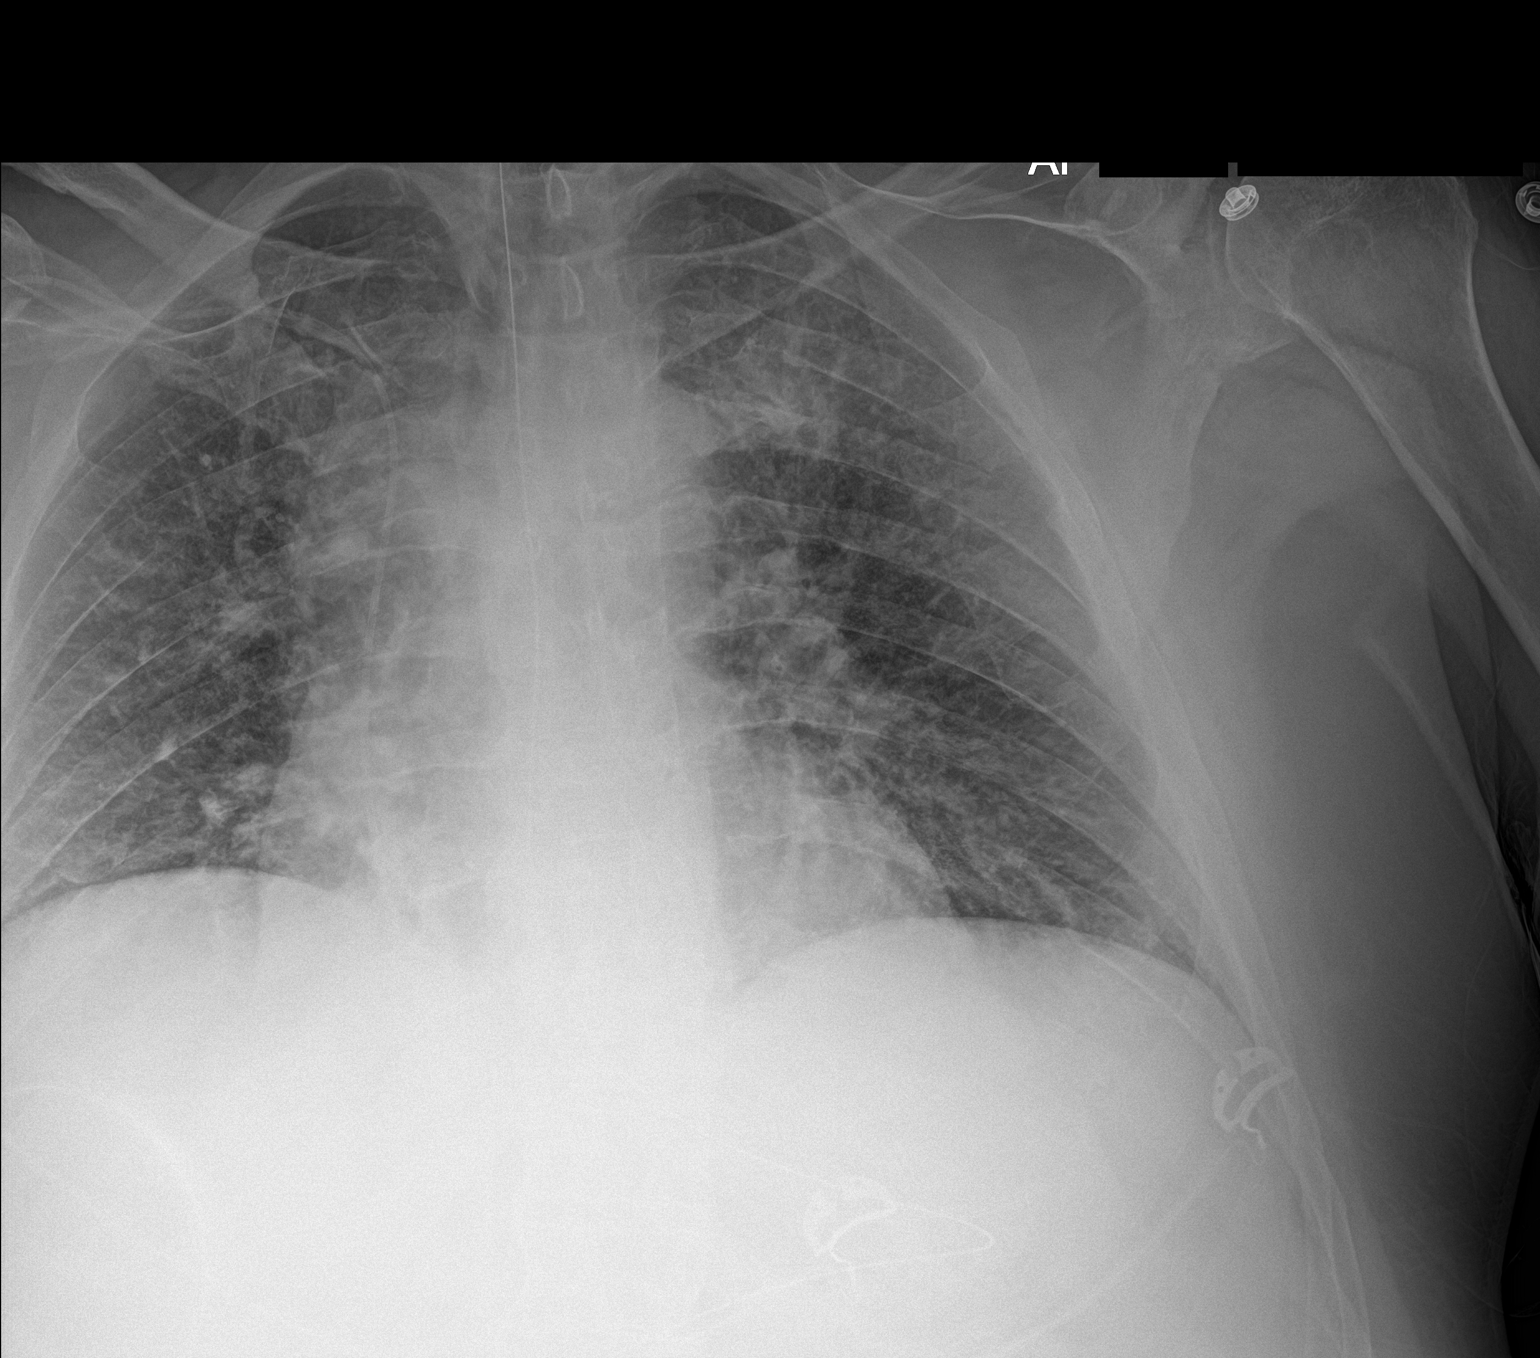

[2 of 2 positions shown; findings below may reference images not displayed]

FINDINGS: Stable cardiomediastinal silhouette. Mild central pulmonary vascular
congestion is noted. No pneumothorax or pleural effusion is noted.
No consolidative process is noted. Bony thorax is unremarkable.
IMPRESSION: Mild central pulmonary vascular congestion. No consolidative process
is noted.

## 2020-10-22 IMAGING — DX DG CHEST 1V PORT
1 series · 1 of 1 positions shown · non-contrast
Comparison: Yesterday

CLINICAL DATA: Fever and cough.  Influenza

EXAM:
PORTABLE CHEST 1 VIEW

[chest ap]
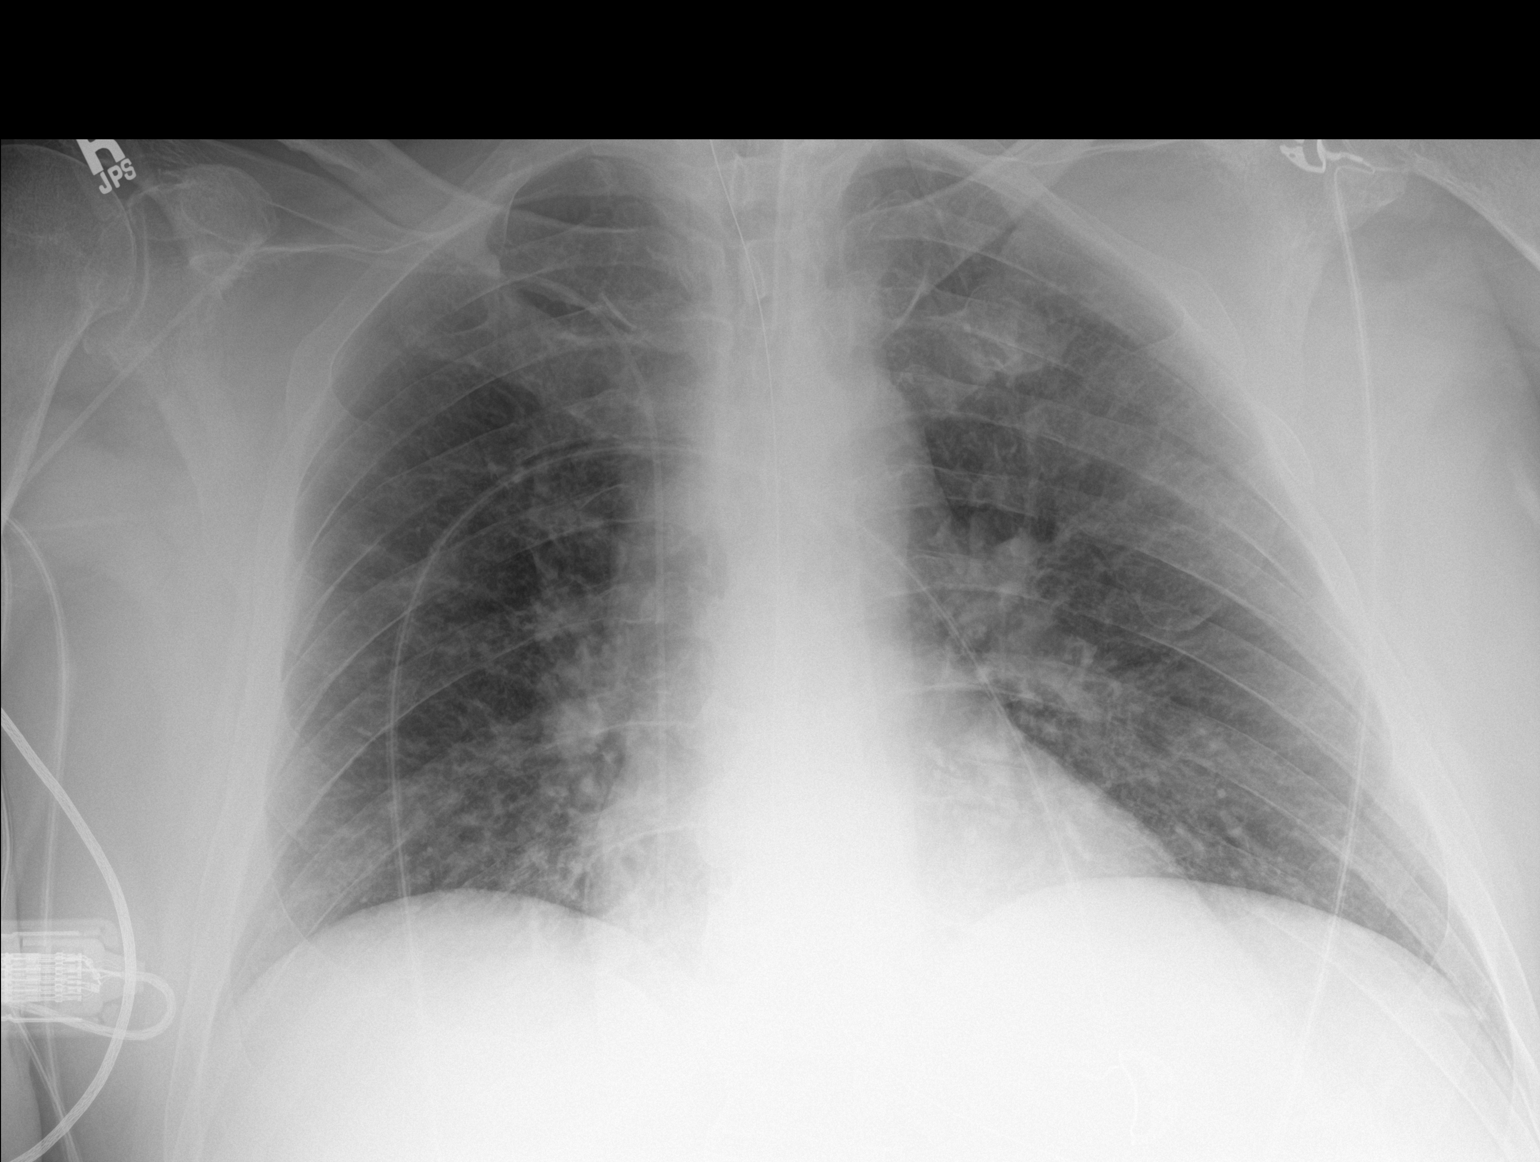

[1 of 1 positions shown; findings below may reference images not displayed]

FINDINGS: Endotracheal tube tip just below the clavicular heads. An orogastric
tube reaches the stomach. New PICC with tip at the upper right
atrium.

Low volume chest with interstitial coarsening, stable. No focal
consolidation. Normal heart size.
IMPRESSION: 1. Stable low volume chest with mild interstitial opacity.
2. Unremarkable hardware positioning.

## 2021-12-28 ENCOUNTER — Encounter (HOSPITAL_COMMUNITY): Payer: Self-pay

## 2021-12-28 ENCOUNTER — Other Ambulatory Visit: Payer: Self-pay

## 2021-12-28 ENCOUNTER — Inpatient Hospital Stay (HOSPITAL_COMMUNITY): Payer: Medicaid Other

## 2021-12-28 ENCOUNTER — Inpatient Hospital Stay (HOSPITAL_COMMUNITY)
Admission: EM | Admit: 2021-12-28 | Discharge: 2022-01-09 | DRG: 617 | Disposition: A | Discharge status: Rehabilitation Facility | Payer: Medicaid Other | Attending: Internal Medicine | Admitting: Internal Medicine

## 2021-12-28 ENCOUNTER — Emergency Department (HOSPITAL_COMMUNITY): Payer: Medicaid Other

## 2021-12-28 DIAGNOSIS — Z01810 Encounter for preprocedural cardiovascular examination: Secondary | ICD-10-CM

## 2021-12-28 DIAGNOSIS — E1169 Type 2 diabetes mellitus with other specified complication: Secondary | ICD-10-CM | POA: Diagnosis present

## 2021-12-28 DIAGNOSIS — E1165 Type 2 diabetes mellitus with hyperglycemia: Secondary | ICD-10-CM | POA: Diagnosis present

## 2021-12-28 DIAGNOSIS — Z720 Tobacco use: Secondary | ICD-10-CM | POA: Diagnosis not present

## 2021-12-28 DIAGNOSIS — L03115 Cellulitis of right lower limb: Secondary | ICD-10-CM | POA: Diagnosis present

## 2021-12-28 DIAGNOSIS — Z8249 Family history of ischemic heart disease and other diseases of the circulatory system: Secondary | ICD-10-CM

## 2021-12-28 DIAGNOSIS — E1152 Type 2 diabetes mellitus with diabetic peripheral angiopathy with gangrene: Secondary | ICD-10-CM | POA: Diagnosis present

## 2021-12-28 DIAGNOSIS — I70261 Atherosclerosis of native arteries of extremities with gangrene, right leg: Secondary | ICD-10-CM | POA: Diagnosis present

## 2021-12-28 DIAGNOSIS — I700 Atherosclerosis of aorta: Secondary | ICD-10-CM | POA: Diagnosis not present

## 2021-12-28 DIAGNOSIS — D649 Anemia, unspecified: Secondary | ICD-10-CM | POA: Diagnosis present

## 2021-12-28 DIAGNOSIS — R451 Restlessness and agitation: Secondary | ICD-10-CM | POA: Diagnosis not present

## 2021-12-28 DIAGNOSIS — J810 Acute pulmonary edema: Secondary | ICD-10-CM | POA: Diagnosis not present

## 2021-12-28 DIAGNOSIS — I872 Venous insufficiency (chronic) (peripheral): Secondary | ICD-10-CM | POA: Diagnosis present

## 2021-12-28 DIAGNOSIS — M869 Osteomyelitis, unspecified: Secondary | ICD-10-CM | POA: Diagnosis present

## 2021-12-28 DIAGNOSIS — Z89512 Acquired absence of left leg below knee: Secondary | ICD-10-CM

## 2021-12-28 DIAGNOSIS — K5900 Constipation, unspecified: Secondary | ICD-10-CM | POA: Diagnosis not present

## 2021-12-28 DIAGNOSIS — I96 Gangrene, not elsewhere classified: Secondary | ICD-10-CM | POA: Diagnosis not present

## 2021-12-28 DIAGNOSIS — E11628 Type 2 diabetes mellitus with other skin complications: Secondary | ICD-10-CM | POA: Diagnosis present

## 2021-12-28 DIAGNOSIS — E11621 Type 2 diabetes mellitus with foot ulcer: Secondary | ICD-10-CM | POA: Diagnosis present

## 2021-12-28 DIAGNOSIS — Z794 Long term (current) use of insulin: Secondary | ICD-10-CM | POA: Diagnosis not present

## 2021-12-28 DIAGNOSIS — I2510 Atherosclerotic heart disease of native coronary artery without angina pectoris: Secondary | ICD-10-CM | POA: Diagnosis present

## 2021-12-28 DIAGNOSIS — Z833 Family history of diabetes mellitus: Secondary | ICD-10-CM

## 2021-12-28 DIAGNOSIS — R0902 Hypoxemia: Secondary | ICD-10-CM

## 2021-12-28 DIAGNOSIS — F17210 Nicotine dependence, cigarettes, uncomplicated: Secondary | ICD-10-CM | POA: Diagnosis present

## 2021-12-28 DIAGNOSIS — Z79899 Other long term (current) drug therapy: Secondary | ICD-10-CM

## 2021-12-28 DIAGNOSIS — Z9049 Acquired absence of other specified parts of digestive tract: Secondary | ICD-10-CM

## 2021-12-28 DIAGNOSIS — E785 Hyperlipidemia, unspecified: Secondary | ICD-10-CM | POA: Diagnosis present

## 2021-12-28 DIAGNOSIS — E1151 Type 2 diabetes mellitus with diabetic peripheral angiopathy without gangrene: Secondary | ICD-10-CM | POA: Diagnosis not present

## 2021-12-28 DIAGNOSIS — Z89431 Acquired absence of right foot: Secondary | ICD-10-CM | POA: Diagnosis not present

## 2021-12-28 DIAGNOSIS — I739 Peripheral vascular disease, unspecified: Secondary | ICD-10-CM | POA: Diagnosis not present

## 2021-12-28 DIAGNOSIS — Z89421 Acquired absence of other right toe(s): Secondary | ICD-10-CM

## 2021-12-28 DIAGNOSIS — Z7982 Long term (current) use of aspirin: Secondary | ICD-10-CM | POA: Diagnosis not present

## 2021-12-28 DIAGNOSIS — R1031 Right lower quadrant pain: Secondary | ICD-10-CM | POA: Diagnosis not present

## 2021-12-28 DIAGNOSIS — L97519 Non-pressure chronic ulcer of other part of right foot with unspecified severity: Secondary | ICD-10-CM | POA: Diagnosis present

## 2021-12-28 DIAGNOSIS — Z91041 Radiographic dye allergy status: Secondary | ICD-10-CM

## 2021-12-28 DIAGNOSIS — Z825 Family history of asthma and other chronic lower respiratory diseases: Secondary | ICD-10-CM

## 2021-12-28 DIAGNOSIS — I999 Unspecified disorder of circulatory system: Secondary | ICD-10-CM

## 2021-12-28 DIAGNOSIS — M86171 Other acute osteomyelitis, right ankle and foot: Secondary | ICD-10-CM | POA: Diagnosis not present

## 2021-12-28 DIAGNOSIS — Z9714 Presence of artificial left leg (complete) (partial): Secondary | ICD-10-CM

## 2021-12-28 DIAGNOSIS — Z20822 Contact with and (suspected) exposure to covid-19: Secondary | ICD-10-CM | POA: Diagnosis present

## 2021-12-28 DIAGNOSIS — Z6827 Body mass index (BMI) 27.0-27.9, adult: Secondary | ICD-10-CM

## 2021-12-28 DIAGNOSIS — M86172 Other acute osteomyelitis, left ankle and foot: Secondary | ICD-10-CM | POA: Diagnosis not present

## 2021-12-28 DIAGNOSIS — E669 Obesity, unspecified: Secondary | ICD-10-CM | POA: Diagnosis present

## 2021-12-28 DIAGNOSIS — Z91128 Patient's intentional underdosing of medication regimen for other reason: Secondary | ICD-10-CM

## 2021-12-28 DIAGNOSIS — I70235 Atherosclerosis of native arteries of right leg with ulceration of other part of foot: Secondary | ICD-10-CM | POA: Diagnosis not present

## 2021-12-28 DIAGNOSIS — T50906A Underdosing of unspecified drugs, medicaments and biological substances, initial encounter: Secondary | ICD-10-CM | POA: Diagnosis present

## 2021-12-28 DIAGNOSIS — I10 Essential (primary) hypertension: Secondary | ICD-10-CM | POA: Diagnosis present

## 2021-12-28 DIAGNOSIS — I252 Old myocardial infarction: Secondary | ICD-10-CM

## 2021-12-28 DIAGNOSIS — Z880 Allergy status to penicillin: Secondary | ICD-10-CM | POA: Diagnosis not present

## 2021-12-28 DIAGNOSIS — Z955 Presence of coronary angioplasty implant and graft: Secondary | ICD-10-CM

## 2021-12-28 DIAGNOSIS — N179 Acute kidney failure, unspecified: Secondary | ICD-10-CM | POA: Diagnosis not present

## 2021-12-28 LAB — LIPID PANEL
Cholesterol: 206 mg/dL — ABNORMAL HIGH (ref 0–200)
HDL: 26 mg/dL — ABNORMAL LOW (ref >40)
LDL Cholesterol: 112 mg/dL — ABNORMAL HIGH (ref 0–99)
Total CHOL/HDL Ratio: 7.9 RATIO
Triglycerides: 338 mg/dL — ABNORMAL HIGH (ref <150)
VLDL: 68 mg/dL — ABNORMAL HIGH (ref 0–40)

## 2021-12-28 LAB — I-STAT CHEM 8, ED
BUN: 7 mg/dL — ABNORMAL LOW (ref 8–23)
Calcium, Ion: 1.13 mmol/L — ABNORMAL LOW (ref 1.15–1.40)
Chloride: 97 mmol/L — ABNORMAL LOW (ref 98–111)
Creatinine, Ser: 0.8 mg/dL (ref 0.61–1.24)
Glucose, Bld: 221 mg/dL — ABNORMAL HIGH (ref 70–99)
HCT: 49 % (ref 39.0–52.0)
Hemoglobin: 16.7 g/dL (ref 13.0–17.0)
Potassium: 4 mmol/L (ref 3.5–5.1)
Sodium: 138 mmol/L (ref 135–145)
TCO2: 34 mmol/L — ABNORMAL HIGH (ref 22–32)

## 2021-12-28 LAB — BASIC METABOLIC PANEL
Anion gap: 9 (ref 5–15)
BUN: 7 mg/dL — ABNORMAL LOW (ref 8–23)
CO2: 29 mmol/L (ref 22–32)
Calcium: 8.8 mg/dL — ABNORMAL LOW (ref 8.9–10.3)
Chloride: 98 mmol/L (ref 98–111)
Creatinine, Ser: 0.84 mg/dL (ref 0.61–1.24)
GFR, Estimated: >60 mL/min (ref >60)
Glucose, Bld: 225 mg/dL — ABNORMAL HIGH (ref 70–99)
Potassium: 3.9 mmol/L (ref 3.5–5.1)
Sodium: 136 mmol/L (ref 135–145)

## 2021-12-28 LAB — CBC WITH DIFFERENTIAL/PLATELET
Abs Immature Granulocytes: 0.04 10*3/uL (ref 0.00–0.07)
Basophils Absolute: 0 10*3/uL (ref 0.0–0.1)
Basophils Relative: 1 %
Eosinophils Absolute: 0.1 10*3/uL (ref 0.0–0.5)
Eosinophils Relative: 2 %
HCT: 48.8 % (ref 39.0–52.0)
Hemoglobin: 16.6 g/dL (ref 13.0–17.0)
Immature Granulocytes: 1 %
Lymphocytes Relative: 28 %
Lymphs Abs: 1.9 10*3/uL (ref 0.7–4.0)
MCH: 29.2 pg (ref 26.0–34.0)
MCHC: 34 g/dL (ref 30.0–36.0)
MCV: 85.9 fL (ref 80.0–100.0)
Monocytes Absolute: 0.4 10*3/uL (ref 0.1–1.0)
Monocytes Relative: 6 %
Neutro Abs: 4.4 10*3/uL (ref 1.7–7.7)
Neutrophils Relative %: 62 %
Platelets: 222 10*3/uL (ref 150–400)
RBC: 5.68 MIL/uL (ref 4.22–5.81)
RDW: 12.4 % (ref 11.5–15.5)
WBC: 6.9 10*3/uL (ref 4.0–10.5)
nRBC: 0 % (ref 0.0–0.2)

## 2021-12-28 LAB — URINALYSIS, ROUTINE W REFLEX MICROSCOPIC
Bilirubin Urine: NEGATIVE
Glucose, UA: >=500 mg/dL — AB
Ketones, ur: NEGATIVE mg/dL
Leukocytes,Ua: NEGATIVE
Nitrite: NEGATIVE
Protein, ur: >300 mg/dL — AB
Specific Gravity, Urine: >1.03 — ABNORMAL HIGH (ref 1.005–1.030)
pH: 6 (ref 5.0–8.0)

## 2021-12-28 LAB — URINALYSIS, MICROSCOPIC (REFLEX)

## 2021-12-28 LAB — GLUCOSE, CAPILLARY
Glucose-Capillary: 349 mg/dL — ABNORMAL HIGH (ref 70–99)
Glucose-Capillary: 336 mg/dL — ABNORMAL HIGH (ref 70–99)

## 2021-12-28 LAB — HEMOGLOBIN A1C
Hgb A1c MFr Bld: 11.3 % — ABNORMAL HIGH (ref 4.8–5.6)
Mean Plasma Glucose: 277.61 mg/dL

## 2021-12-28 LAB — LACTIC ACID, PLASMA: Lactic Acid, Venous: 1.1 mmol/L (ref 0.5–1.9)

## 2021-12-28 LAB — RESP PANEL BY RT-PCR (FLU A&B, COVID) ARPGX2
Influenza A by PCR: NEGATIVE
Influenza B by PCR: NEGATIVE
SARS Coronavirus 2 by RT PCR: NEGATIVE

## 2021-12-28 MED ORDER — SENNOSIDES-DOCUSATE SODIUM 8.6-50 MG PO TABS
2.0000 | ORAL_TABLET | Freq: Every day | ORAL | Status: DC
  Administered 2021-12-28 – 2022-01-02 (×5): 2 via ORAL
  Filled 2021-12-28 (×5): qty 2

## 2021-12-28 MED ORDER — POLYETHYLENE GLYCOL 3350 17 G PO PACK
17.0000 g | PACK | Freq: Every day | ORAL | Status: DC
  Administered 2022-01-03 – 2022-01-04 (×2): 17 g via ORAL
  Filled 2021-12-28 (×3): qty 1

## 2021-12-28 MED ORDER — INSULIN ASPART 100 UNIT/ML IJ SOLN
0.0000 [IU] | Freq: Three times a day (TID) | INTRAMUSCULAR | Status: DC
  Administered 2021-12-28 – 2021-12-29 (×2): 11 [IU] via SUBCUTANEOUS

## 2021-12-28 MED ORDER — IOHEXOL 350 MG/ML SOLN
150.0000 mL | Freq: Once | INTRAVENOUS | Status: AC | PRN
  Administered 2021-12-28: 150 mL via INTRAVENOUS

## 2021-12-28 MED ORDER — ALBUTEROL SULFATE HFA 108 (90 BASE) MCG/ACT IN AERS: 2.0000 | INHALATION_SPRAY | RESPIRATORY_TRACT | Status: DC | PRN

## 2021-12-28 MED ORDER — VANCOMYCIN HCL 2000 MG/400ML IV SOLN
2000.0000 mg | Freq: Once | INTRAVENOUS | Status: AC
  Administered 2021-12-28: 2000 mg via INTRAVENOUS
  Filled 2021-12-28 (×2): qty 400

## 2021-12-28 MED ORDER — VANCOMYCIN HCL 1500 MG/300ML IV SOLN
1500.0000 mg | Freq: Once | INTRAVENOUS | Status: DC
  Filled 2021-12-28: qty 300

## 2021-12-28 MED ORDER — VANCOMYCIN HCL 1500 MG/300ML IV SOLN
1500.0000 mg | Freq: Two times a day (BID) | INTRAVENOUS | Status: DC
  Administered 2021-12-28 – 2021-12-30 (×3): 1500 mg via INTRAVENOUS
  Filled 2021-12-28 (×4): qty 300

## 2021-12-28 MED ORDER — OXYCODONE HCL 5 MG PO TABS
5.0000 mg | ORAL_TABLET | Freq: Four times a day (QID) | ORAL | Status: DC | PRN
  Administered 2021-12-28 – 2021-12-30 (×5): 5 mg via ORAL
  Filled 2021-12-28 (×6): qty 1

## 2021-12-28 MED ORDER — AMLODIPINE BESYLATE 10 MG PO TABS
10.0000 mg | ORAL_TABLET | Freq: Every day | ORAL | Status: DC
  Administered 2021-12-28 – 2022-01-09 (×13): 10 mg via ORAL
  Filled 2021-12-28 (×10): qty 1
  Filled 2021-12-28: qty 2
  Filled 2021-12-28: qty 1

## 2021-12-28 MED ORDER — OXYCODONE HCL 5 MG PO TABS
5.0000 mg | ORAL_TABLET | Freq: Four times a day (QID) | ORAL | Status: DC | PRN
Start: 2021-12-28 — End: 2021-12-28

## 2021-12-28 MED ORDER — INSULIN GLARGINE-YFGN 100 UNIT/ML ~~LOC~~ SOLN
10.0000 [IU] | Freq: Every day | SUBCUTANEOUS | Status: DC
  Administered 2021-12-28: 10 [IU] via SUBCUTANEOUS
  Filled 2021-12-28 (×4): qty 0.1

## 2021-12-28 MED ORDER — SODIUM CHLORIDE 0.9 % IV BOLUS
500.0000 mL | Freq: Once | INTRAVENOUS | Status: AC
  Administered 2021-12-28: 500 mL via INTRAVENOUS

## 2021-12-28 MED ORDER — LABETALOL HCL 5 MG/ML IV SOLN: 10.0000 mg | INTRAVENOUS | Status: DC | PRN

## 2021-12-28 MED ORDER — DIPHENHYDRAMINE HCL 25 MG PO CAPS
50.0000 mg | ORAL_CAPSULE | Freq: Once | ORAL | Status: AC
  Administered 2021-12-28: 50 mg via ORAL
  Filled 2021-12-28: qty 2

## 2021-12-28 MED ORDER — FENTANYL CITRATE PF 50 MCG/ML IJ SOSY
50.0000 ug | PREFILLED_SYRINGE | Freq: Once | INTRAMUSCULAR | Status: AC
  Administered 2021-12-28: 50 ug via INTRAVENOUS
  Filled 2021-12-28: qty 1

## 2021-12-28 MED ORDER — ENOXAPARIN SODIUM 40 MG/0.4ML IJ SOSY: 40.0000 mg | PREFILLED_SYRINGE | INTRAMUSCULAR | Status: DC

## 2021-12-28 MED ORDER — SODIUM CHLORIDE 0.9 % IV SOLN
2.0000 g | Freq: Once | INTRAVENOUS | Status: AC
  Administered 2021-12-28: 2 g via INTRAVENOUS
  Filled 2021-12-28: qty 20

## 2021-12-28 MED ORDER — ACETAMINOPHEN 325 MG PO TABS: 650.0000 mg | ORAL_TABLET | Freq: Four times a day (QID) | ORAL | Status: DC | PRN

## 2021-12-28 MED ORDER — HYDROCORTISONE SOD SUC (PF) 250 MG IJ SOLR
200.0000 mg | Freq: Once | INTRAMUSCULAR | Status: AC
  Administered 2021-12-28: 200 mg via INTRAVENOUS
  Filled 2021-12-28: qty 200

## 2021-12-28 MED ORDER — CARVEDILOL 6.25 MG PO TABS
6.2500 mg | ORAL_TABLET | Freq: Two times a day (BID) | ORAL | Status: DC
  Administered 2021-12-28: 6.25 mg via ORAL
  Filled 2021-12-28: qty 1

## 2021-12-28 MED ORDER — ENOXAPARIN SODIUM 40 MG/0.4ML IJ SOSY
40.0000 mg | PREFILLED_SYRINGE | INTRAMUSCULAR | Status: DC
  Administered 2021-12-30 – 2022-01-08 (×10): 40 mg via SUBCUTANEOUS
  Filled 2021-12-28 (×10): qty 0.4

## 2021-12-28 MED ORDER — HYDROMORPHONE HCL 1 MG/ML IJ SOLN
0.5000 mg | INTRAMUSCULAR | Status: DC | PRN
  Administered 2021-12-28 – 2021-12-31 (×16): 0.5 mg via INTRAVENOUS
  Filled 2021-12-28 (×5): qty 1
  Filled 2021-12-28: qty 0.5
  Filled 2021-12-28 (×5): qty 1
  Filled 2021-12-28: qty 0.5
  Filled 2021-12-28 (×2): qty 1
  Filled 2021-12-28: qty 0.5

## 2021-12-28 MED ORDER — ATORVASTATIN CALCIUM 40 MG PO TABS
40.0000 mg | ORAL_TABLET | Freq: Every day | ORAL | Status: DC
  Administered 2021-12-28 – 2022-01-08 (×12): 40 mg via ORAL
  Filled 2021-12-28 (×12): qty 1

## 2021-12-28 MED ORDER — SODIUM CHLORIDE 0.9 % IV SOLN
2.0000 g | Freq: Three times a day (TID) | INTRAVENOUS | Status: DC
  Administered 2021-12-28 – 2022-01-02 (×15): 2 g via INTRAVENOUS
  Filled 2021-12-28 (×15): qty 2

## 2021-12-28 MED ORDER — ALBUTEROL SULFATE (2.5 MG/3ML) 0.083% IN NEBU: 2.5000 mg | INHALATION_SOLUTION | RESPIRATORY_TRACT | Status: DC | PRN

## 2021-12-28 MED ORDER — ACETAMINOPHEN 650 MG RE SUPP: 650.0000 mg | Freq: Four times a day (QID) | RECTAL | Status: DC | PRN

## 2021-12-28 MED ORDER — LABETALOL HCL 5 MG/ML IV SOLN
20.0000 mg | Freq: Once | INTRAVENOUS | Status: AC
  Administered 2021-12-28: 20 mg via INTRAVENOUS
  Filled 2021-12-28: qty 4

## 2021-12-28 MED ORDER — DIPHENHYDRAMINE HCL 50 MG/ML IJ SOLN
50.0000 mg | Freq: Once | INTRAMUSCULAR | Status: AC
  Filled 2021-12-28: qty 1

## 2021-12-28 NOTE — ED Notes (Signed)
Patient to CT scan

## 2021-12-28 NOTE — Consult Note (Signed)
Reason for Consult:Osteomyeltis Referring Physician: Mercie EonJulie Machen Time called: [id number] Time at bedside: [address]   Norman [name] is an 66 y.o. male.  HPI: Antonio [name] comes in with right foot pain. He has been dealing with an ulcer on the foot for well over a month. [facility] but was unable to get a ride this time. He denies fevers, chills, sweats, N/V. He has had other diabetic foot wounds and has had the great toe amputated. He does not treat his diabetes.  Past Medical History:  Diagnosis Date   Coronary artery disease    Diabetes mellitus without complication (HCC)    Hyperlipidemia    Hypertension    Osteomyelitis (HCC) 03/25/2017   RT FOOT    Past Surgical History:  Procedure Laterality Date   AMPUTATION Right 03/27/2017   Procedure: 1st Ray Amputation Right Foot;  Surgeon: Nadara MustardMarcus Duda V, MD;  [facility];  Service: Orthopedics;  Laterality: Right;   APPENDECTOMY     BELOW KNEE LEG AMPUTATION Left    CARDIAC CATHETERIZATION     CORONARY STENT INTERVENTION  2005   Great toe amputation right.     I & D EXTREMITY Left 12/30/2015   Procedure: IRRIGATION AND DEBRIDEMENT LEFT FOOT, TRANSMETATARSAL AMPUTATION WITH APPLICATION OF ANTIBIOTIC BEADS AND WOUND VAC;  Surgeon: Nadara MustardMarcus Duda V, MD;  Location: [facility] OR;  Service: Orthopedics;  Laterality: Left;   IR GASTROSTOMY TUBE MOD SED  12/23/2018   TONSILLECTOMY     TRACHEOSTOMY  11/2018    Family History  Problem Relation Age of Onset   Diabetes Mother    Emphysema Father    CAD Sister 2762       Died of MI after flu    Social History:  reports that he quit smoking about 3 years ago. His smoking use included cigarettes. He has a 70.00 pack-year smoking history. He has never used smokeless tobacco. He reports current alcohol use. He reports that he does not use drugs.  Allergies:  Allergies  Allergen Reactions   Iodinated Contrast Media Hives, Rash and Other (See Comments)     Blisters Staph Blisters / staph   Penicillins Other (See Comments)    UNSPECIFIED REACTION  Has patient had a PCN reaction causing immediate rash, facial/tongue/throat swelling, SOB or lightheadedness with hypotension: No Has patient had a PCN reaction causing severe rash involving mucus membranes or skin necrosis: No Has patient had a PCN reaction that required hospitalization: No Has patient had a PCN reaction occurring within the last 10 years: No If all of the above answers are "NO", then may proceed with Cephalosporin use.    Medications: I have reviewed the patient's current medications.  Results for orders placed or performed during the hospital encounter of 12/28/21 (from the past 48 hour(s))  Urinalysis, Routine w reflex microscopic     Status: Abnormal   Collection Time: 12/28/21  6:57 AM  Result Value Ref Range   Color, Urine YELLOW YELLOW   APPearance CLEAR CLEAR   Specific Gravity, Urine >1.030 (H) 1.005 - 1.030   pH 6.0 5.0 - 8.0   Glucose, UA >=500 (A) NEGATIVE mg/dL   Hgb urine dipstick LARGE (A) NEGATIVE   Bilirubin Urine NEGATIVE NEGATIVE   Ketones, ur NEGATIVE NEGATIVE mg/dL   Protein, ur >960>300 (A) NEGATIVE mg/dL   Nitrite NEGATIVE NEGATIVE   Leukocytes,Ua NEGATIVE NEGATIVE    Comment: Performed at [facility], [address]  [place], [place] [address]  Urinalysis, Microscopic (reflex)     Status: Abnormal   Collection Time: 12/28/21  6:57 AM  Result Value Ref Range   RBC / HPF 11-20 0 - 5 RBC/hpf   WBC, UA 0-5 0 - 5 WBC/hpf   Bacteria, UA FEW (A) NONE SEEN   Squamous Epithelial / LPF 0-5 0 - 5   Mucus PRESENT    Granular Casts, UA PRESENT     Comment: Performed at [facility], [address], [place], [place] [address]  CBC with Differential     Status: None   Collection Time: 12/28/21  8:15 AM  Result Value Ref Range   WBC 6.9 4.0 - 10.5 K/uL   RBC 5.68 4.22 - 5.81 MIL/uL   Hemoglobin 16.6 13.0 - 17.0 g/dL   HCT 59.9 35.7 - 01.7 %    MCV 85.9 80.0 - 100.0 fL   MCH 29.2 26.0 - 34.0 pg   MCHC 34.0 30.0 - 36.0 g/dL   RDW 79.3 90.3 - 00.9 %   Platelets 222 150 - 400 K/uL   nRBC 0.0 0.0 - 0.2 %   Neutrophils Relative % 62 %   Neutro Abs 4.4 1.7 - 7.7 K/uL   Lymphocytes Relative 28 %   Lymphs Abs 1.9 0.7 - 4.0 K/uL   Monocytes Relative 6 %   Monocytes Absolute 0.4 0.1 - 1.0 K/uL   Eosinophils Relative 2 %   Eosinophils Absolute 0.1 0.0 - 0.5 K/uL   Basophils Relative 1 %   Basophils Absolute 0.0 0.0 - 0.1 K/uL   Immature Granulocytes 1 %   Abs Immature Granulocytes 0.04 0.00 - 0.07 K/uL    Comment: Performed at [facility], [address], [place], [place] [address]  Basic metabolic panel     Status: Abnormal   Collection Time: 12/28/21  8:15 AM  Result Value Ref Range   Sodium 136 135 - 145 mmol/L   Potassium 3.9 3.5 - 5.1 mmol/L   Chloride 98 98 - 111 mmol/L   CO2 29 22 - 32 mmol/L   Glucose, Bld 225 (H) 70 - 99 mg/dL    Comment: Glucose reference range applies only to samples taken after fasting for at least 8 hours.   BUN 7 (L) 8 - 23 mg/dL   Creatinine, Ser 7.62 0.61 - 1.24 mg/dL   Calcium 8.8 (L) 8.9 - 10.3 mg/dL   GFR, Estimated >26 >33 mL/min    Comment: (NOTE) Calculated using the CKD-EPI Creatinine Equation (2021)    Anion gap 9 5 - 15    Comment: Performed at [facility], [address] [place], [place] [address]  Resp Panel by RT-PCR (Flu A&B, Covid) Nasopharyngeal Swab     Status: None   Collection Time: 12/28/21  8:15 AM   Specimen: Nasopharyngeal Swab; Nasopharyngeal(NP) swabs in vial transport medium  Result Value Ref Range   SARS Coronavirus 2 by RT PCR NEGATIVE NEGATIVE    Comment: (NOTE) SARS-CoV-2 target nucleic acids are NOT DETECTED.  The SARS-CoV-2 RNA is generally detectable in upper respiratory specimens during the acute phase of infection. The lowest concentration of SARS-CoV-2 viral copies this assay can detect is 138 copies/mL. A negative result does not  preclude SARS-Cov-2 infection and should not be used as the sole basis for treatment or other patient management decisions. A negative result may occur with  improper specimen collection/handling, submission of specimen other than nasopharyngeal swab, presence of viral mutation(s)  within the areas targeted by this assay, and inadequate number of viral copies(<138 copies/mL). A negative result must be combined with clinical observations, patient history, and epidemiological information. The expected result is Negative.  Fact Sheet for Patients:  BloggerCourse.com  Fact Sheet for Healthcare Providers:  SeriousBroker.it  This test is no t yet approved or cleared by the [place] FDA and  has been authorized for detection and/or diagnosis of SARS-CoV-2 by FDA under an Emergency Use Authorization (EUA). This EUA will remain  in effect (meaning this test can be used) for the duration of the COVID-19 declaration under Section 564(b)(1) of the Act, 21 U.S.C.section 360bbb-3(b)(1), unless the authorization is terminated  or revoked sooner.       Influenza A by PCR NEGATIVE NEGATIVE   Influenza B by PCR NEGATIVE NEGATIVE    Comment: (NOTE) The Xpert Xpress SARS-CoV-2/FLU/RSV plus assay is intended as an aid in the diagnosis of influenza from Nasopharyngeal swab specimens and should not be used as a sole basis for treatment. Nasal washings and aspirates are unacceptable for Xpert Xpress SARS-CoV-2/FLU/RSV testing.  Fact Sheet for Patients: BloggerCourse.com  Fact Sheet for Healthcare Providers: SeriousBroker.it  This test is not yet approved or cleared by the [place] FDA and has been authorized for detection and/or diagnosis of SARS-CoV-2 by FDA under an Emergency Use Authorization (EUA). This EUA will remain in effect (meaning this test can be used) for the duration of  the COVID-19 declaration under Section 564(b)(1) of the Act, 21 U.S.C. section 360bbb-3(b)(1), unless the authorization is terminated or revoked.  Performed at [facility], [address], [place], [place] [address]   I-stat chem 8, ED (not at [facility] or [facility])     Status: Abnormal   Collection Time: 12/28/21  8:23 AM  Result Value Ref Range   Sodium 138 135 - 145 mmol/L   Potassium 4.0 3.5 - 5.1 mmol/L   Chloride 97 (L) 98 - 111 mmol/L   BUN 7 (L) 8 - 23 mg/dL   Creatinine, Ser 2.80 0.61 - 1.24 mg/dL   Glucose, Bld 034 (H) 70 - 99 mg/dL    Comment: Glucose reference range applies only to samples taken after fasting for at least 8 hours.   Calcium, Ion 1.13 (L) 1.15 - 1.40 mmol/L   TCO2 34 (H) 22 - 32 mmol/L   Hemoglobin 16.7 13.0 - 17.0 g/dL   HCT 91.7 91.5 - 05.6 %    DG Foot Complete Right  Result Date: 12/28/2021 CLINICAL DATA:  Lateral foot ulceration. Ulceration is all over right foot. 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 Is irregularity of the soft tissues lateral to the fifth metatarsal head. In this region there is moderate cortical destruction of the base of the proximal phalanx and the distal aspect of the metatarsal head of the fifth ray. There is resultant mild medial subluxation of fifth toe proximal phalanx. There is again apparent attenuation of the distal aspect of the cuneiforms and cuboid and dorsal subluxation of the second through fifth metatarsals (and possibly the remaining base of the first metatarsal). IMPRESSION:: IMPRESSION: 1. Soft tissue wound lateral to the fifth metatarsal head with new cortical erosion on both sides of fifth metatarsophalangeal joint indicating osteomyelitis. 2. 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 3. No significant  change in dorsal  subluxation of the base of second through fifth metatarsals. Electronically Signed   By: Neita Garnetonald  Viola M.D.   On: 12/28/2021 08:08    Review of Systems  Constitutional:  Negative for chills, diaphoresis and fever.  HENT:  Negative for ear discharge, ear pain, hearing loss and tinnitus.   Eyes:  Negative for photophobia and pain.  Respiratory:  Negative for cough and shortness of breath.   Cardiovascular:  Negative for chest pain.  Gastrointestinal:  Negative for abdominal pain, nausea and vomiting.  Genitourinary:  Negative for dysuria, flank pain, frequency and urgency.  Musculoskeletal:  Positive for arthralgias (Right foot). Negative for back pain, myalgias and neck pain.  Skin:  Positive for wound (Right foot).  Neurological:  Negative for dizziness and headaches.  Hematological:  Does not bruise/bleed easily.  Psychiatric/Behavioral:  The patient is not nervous/anxious.   Blood pressure (!) 169/56, pulse 66, temperature 97.7 F (36.5 C), temperature source Oral, resp. rate 16, height 6' (1.829 m), weight 93 kg, SpO2 93 %. Physical Exam Constitutional:      General: He is not in acute distress.    Appearance: He is well-developed. He is not diaphoretic.  HENT:     Head: Normocephalic and atraumatic.  Eyes:     General: No scleral icterus.       Right eye: No discharge.        Left eye: No discharge.     Conjunctiva/sclera: Conjunctivae normal.  Cardiovascular:     Rate and Rhythm: Normal rate and regular rhythm.  Pulmonary:     Effort: Pulmonary effort is normal. No respiratory distress.  Musculoskeletal:     Cervical back: Normal range of motion.  Feet:     Comments: Right foot: Surgically absent great toe. Ulceration noted over lateral 5th MTP joint with necrosis, foul odor, and purulent discharge. PT/DP 0. Sensation impaired. Also noted cellulitis mid lower leg to ankle. Skin:    General: Skin is warm and dry.  Neurological:     Mental Status: He is alert.   Psychiatric:        Mood and Affect: Mood normal.        Behavior: Behavior normal.    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 Dr. Lajoyce Cornersuda to evaluate. Vascular surgery already on board.    Freeman CaldronMichael J. Peja Allender, PA-C Orthopedic Surgery [phone] 12/28/2021, 10:18 AM

## 2021-12-28 NOTE — Progress Notes (Signed)
Pharmacy Antibiotic Note  Antonio [name]. is a 66 y.o. male admitted on 12/28/2021 with wound infection.  Pharmacy has been consulted for cefepime dosing.  Plan: Cefepime IV 2g q8h Trend WBC, temp, renal function  F/U infectious work-up and narrowing of antibiotics   Height: 6' (182.9 cm) Weight: 93 kg (205 lb) IBW/kg (Calculated) : 77.6  Temp (24hrs), Avg:97.7 F (36.5 C), Min:97.7 F (36.5 C), Max:97.7 F (36.5 C)  Recent Labs  Lab 12/28/21 0815 12/28/21 0823 12/28/21 0940  WBC 6.9  --   --   CREATININE 0.84 0.80  --   LATICACIDVEN  --   --  1.1    Estimated Creatinine Clearance: 103.7 mL/min (by C-G formula based on SCr of 0.8 mg/dL).    Allergies  Allergen Reactions   Iodinated Contrast Media Hives, Rash and Other (See Comments)    Blisters Staph Blisters / staph   Penicillins Other (See Comments)    UNSPECIFIED REACTION  Has patient had a PCN reaction causing immediate rash, facial/tongue/throat swelling, SOB or lightheadedness with hypotension: No Has patient had a PCN reaction causing severe rash involving mucus membranes or skin necrosis: No Has patient had a PCN reaction that required hospitalization: No Has patient had a PCN reaction occurring within the last 10 years: No If all of the above answers are "NO", then may proceed with Cephalosporin use.    Antimicrobials this admission: Ceftriaxone x1 2/2  Vanc 2/2 >>    Microbiology results: 2/2 BCx: IP   Thank you for allowing pharmacy to be a part of this patients care.  Valeda Malm, Pharm.D. PGY-1 Pharmacy Resident [phone] 12/28/2021 1:09 PM

## 2021-12-28 NOTE — ED Provider Notes (Signed)
[facility] EMERGENCY DEPARTMENT Provider Note   CSN: [id number] Arrival date & time: 12/28/21  [id number]     History  Chief Complaint  Patient presents with   Skin Ulcer    Antonio [name]. is a 66 y.o. male pertinent history includes peripheral vascular disease, left BKA, type 2 diabetes, MI, CAD, neuropathy, hypertension, current smoker.  Primary complaint today right leg pain and right foot ulcer.  Patient reports chronic right leg pain worsening over the past 1 month pain aching severe constant no clear aggravating or alleviating factors.  Pain will occasionally radiate up to back.  Additionally patient noticed an ulceration on the lateral side of his right foot 1 month ago and has gradually grown in size and increase in pain, pain is sharp in that area and is associated with a malodorous discharge.  Patient denies fall/injury, fever, chills, change to numbness, nausea/vomiting, diarrhea, dysuria/hematuria or any additional concerns. HPI     Home Medications Prior to Admission medications   Medication Sig Start Date End Date Taking? Authorizing Provider  acetaminophen (TYLENOL) 500 MG tablet Take 500 mg by mouth every 6 (six) hours as needed for moderate pain or headache.   Yes [provider]  aspirin EC 81 MG tablet Take 81 mg by mouth daily.   Yes [provider]  atorvastatin (LIPITOR) 40 MG tablet Take 1 tablet (40 mg total) by mouth daily at 6 PM. Patient not taking: Reported on 12/28/2021 03/28/17   Everrett Coombe, MD  insulin aspart (NOVOLOG) 100 UNIT/ML injection Inject 0-20 Units into the skin 3 (three) times daily with meals. Patient not taking: Reported on 12/28/2021 02/20/19   Barb Merino, MD  insulin detemir (LEVEMIR) 100 UNIT/ML injection Inject 0.15 mLs (15 Units total) into the skin 2 (two) times daily. Patient not taking: Reported on 12/28/2021 02/20/19   Barb Merino, MD  oxyCODONE (OXY IR/ROXICODONE) 5 MG immediate release  tablet Take 1 tablet (5 mg total) by mouth every 6 (six) hours as needed for severe pain. Patient not taking: Reported on 12/28/2021 02/20/19   Elgergawy, Silver Huguenin, MD      Allergies    Iodinated contrast media and Penicillins    Review of Systems   Review of Systems Ten systems are reviewed and are negative for acute change except as noted in the HPI  Physical Exam Updated Vital Signs BP (!) 169/56    Pulse 66    Temp 97.7 F (36.5 C) (Oral)    Resp 16    Ht 6' (1.829 m)    Wt 93 kg    SpO2 93%    BMI 27.80 kg/m  Physical Exam Constitutional:      General: He is not in acute distress.    Appearance: Normal appearance. He is well-developed. He is not ill-appearing or diaphoretic.  HENT:     Head: Normocephalic and atraumatic.  Eyes:     General: Vision grossly intact. Gaze aligned appropriately.     Pupils: Pupils are equal, round, and reactive to light.  Neck:     Trachea: Trachea and phonation normal.  Cardiovascular:     Pulses:          Dorsalis pedis pulses are 0 on the right side.       Posterior tibial pulses are 0 on the right side.  Pulmonary:     Effort: Pulmonary effort is normal. No respiratory distress.  Abdominal:     General: There is no distension.  Palpations: Abdomen is soft.     Tenderness: There is no abdominal tenderness. There is no guarding or rebound.  Musculoskeletal:        General: Normal range of motion.     Cervical back: Normal range of motion.       Feet:  Feet:     Comments: 1.  Proximal to similar ulceration with malodorous discharge.  Right first toe amputation.  Chronic appearing swelling and skin changes/scaling of the right lower leg extending down to the foot.  Unable to Doppler pedal pulses.  Popliteal pulses dopplerable.  Left BKA Skin:    General: Skin is warm and dry.  Neurological:     Mental Status: He is alert.     GCS: GCS eye subscore is 4. GCS verbal subscore is 5. GCS motor subscore is 6.     Comments: Speech is  clear and goal oriented, follows commands Major Cranial nerves without deficit, no facial droop Moves extremities without ataxia, coordination intact  Psychiatric:        Behavior: Behavior normal.    ED Results / Procedures / Treatments   Labs (all labs ordered are listed, but only abnormal results are displayed) Labs Reviewed  BASIC METABOLIC PANEL - Abnormal; Notable for the following components:      Result Value   Glucose, Bld 225 (*)    BUN 7 (*)    Calcium 8.8 (*)    All other components within normal limits  URINALYSIS, ROUTINE W REFLEX MICROSCOPIC - Abnormal; Notable for the following components:   Specific Gravity, Urine >1.030 (*)    Glucose, UA >=500 (*)    Hgb urine dipstick LARGE (*)    Protein, ur >300 (*)    All other components within normal limits  URINALYSIS, MICROSCOPIC (REFLEX) - Abnormal; Notable for the following components:   Bacteria, UA FEW (*)    All other components within normal limits  I-STAT CHEM 8, ED - Abnormal; Notable for the following components:   Chloride 97 (*)    BUN 7 (*)    Glucose, Bld 221 (*)    Calcium, Ion 1.13 (*)    TCO2 34 (*)    All other components within normal limits  RESP PANEL BY RT-PCR (FLU A&B, COVID) ARPGX2  CULTURE, BLOOD (ROUTINE X 2)  CULTURE, BLOOD (ROUTINE X 2)  CBC WITH DIFFERENTIAL/PLATELET  LACTIC ACID, PLASMA    EKG None  Radiology DG Foot Complete Right  Result Date: 12/28/2021 CLINICAL DATA:  Lateral foot ulceration. Ulceration is all over right foot. 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 Is irregularity of the soft tissues lateral to the fifth metatarsal head. In this region there is moderate cortical destruction of the base of the proximal phalanx and the distal aspect of the metatarsal head of the fifth ray. There is resultant mild medial  subluxation of fifth toe proximal phalanx. There is again apparent attenuation of the distal aspect of the cuneiforms and cuboid and dorsal subluxation of the second through fifth metatarsals (and possibly the remaining base of the first metatarsal). IMPRESSION:: IMPRESSION: 1. Soft tissue wound lateral to the fifth metatarsal head with new cortical erosion on both sides of fifth metatarsophalangeal joint indicating osteomyelitis. 2. 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 3. No significant change in dorsal subluxation  of the base of second through fifth metatarsals. Electronically Signed   By: Yvonne Kendall M.D.   On: 12/28/2021 08:08    Procedures Procedures    Medications Ordered in ED Medications  diphenhydrAMINE (BENADRYL) capsule 50 mg (has no administration in time range)    Or  diphenhydrAMINE (BENADRYL) injection 50 mg (has no administration in time range)  vancomycin (VANCOREADY) IVPB 2000 mg/400 mL (has no administration in time range)  vancomycin (VANCOREADY) IVPB 1500 mg/300 mL (has no administration in time range)  fentaNYL (SUBLIMAZE) injection 50 mcg (50 mcg Intravenous Given 12/28/21 0903)  hydrocortisone sodium succinate (SOLU-CORTEF) injection 200 mg (200 mg Intravenous Given 12/28/21 0950)  sodium chloride 0.9 % bolus 500 mL (500 mLs Intravenous New Bag/Given 12/28/21 0904)  cefTRIAXone (ROCEPHIN) 2 g in sodium chloride 0.9 % 100 mL IVPB ( Intravenous Stopped 12/28/21 0935)  labetalol (NORMODYNE) injection 20 mg (20 mg Intravenous Given 12/28/21 [id number])    ED Course/ Medical Decision Making/ A&P Clinical Course as of 12/28/21 1004  Thu Feb 02, [id number]  [id number] 66 year old male with history of peripheral vascular disease complaining of worsening right leg pain for the last week.  Has had an ulceration on the lateral side of his foot.  Foul-smelling.  No fevers.  Unable to get signal at ankle.  Does have dopplerable popliteal.  Getting labs and  will need imaging vascular consult. [MB]    Clinical Course User Index [MB] Hayden Rasmussen, MD                           Medical Decision Making Within normal limits.  66 year old male with several chronic comorbid conditions including peripheral arterial disease, current smoker, type 2 diabetes, CAD, MI, left BKA, hypertension.  Patient arrived with diffuse right leg pain without injury, additionally with an ulcer on the lateral side of the right foot.  He is noncompliant with his medications and has not followed up with his vascular surgeon in close to 2 years.  Right leg pain will occasionally radiate up into his back.  Unable to find pedal pulse w/ doppler, patient reports this is normal for him. I consulted with attending physician Dr. Melina Copa who evaluated the patient as well. Popliteal pulse was found by doppler. Consult placed to vascular surgery.  Differential includes but not limited to ischemic leg, osteomyelitis, cellulitis, sciatica, DVT. I ordered labs including a CBC, BMP, i-STAT Chem-8, COVID test.  Portable x-ray of the right foot ordered for evaluation of possible osteomyelitis. ------ Consulted with vascular surgery, Eric at 7:16 AM who advises they are coming to see the patient. ------ Vascular surgery reports they were able to find a faint PT pulse on Doppler however no palpable femoral pulse; they are concerned for ischemia of the leg which may require intervention during this hospitalization.  Vascular surgery would like a CTA of the abdomen with runoff and advised admission to medicine team.  Vascular surgery aware of IV contrast allergy w/ hives and wished to proceed with scan.    X-ray revealed likely osteomyelitis, Vascular surgery would also like either podiatry or Ortho input.  I have ordered IV antibiotics for the osteomyelitis.  I consulted with medicine team, Dr. Darrick Meigs who accepted the patient for admission.  I also ordered IV medications for contrast  allergy. ----- I reassessed the patient multiple times during this visit, he remains in no acute distress and stable on room air.  He is agreeable to admission  at this time.  Patient seen and evaluated by attending physician Dr. Melina Copa during this visit who agrees with plan of care.  Additionally patient found to be significantly hypertensive during this visit, noncompliant with all home medications.  This may be contributing to his leg pain; discussed w/ Dr. Melina Copa-  20 mg IV labetalol ordered to help control hypertension. Dr. Darrick Meigs, admitting team updated and agrees w/ labetalol. --- 10 AM: Discussed case with on-call orthopedic specialist Silvestre Gunner, PA-C evaluate patient for osteomyelitis.  Problems Addressed: Hypertension, unspecified type: chronic illness or injury with exacerbation, progression, or side effects of treatment Osteomyelitis of right foot, unspecified type ([place]): undiagnosed new problem with uncertain prognosis PVD (peripheral vascular disease) ([place]): undiagnosed new problem with uncertain prognosis  Amount and/or Complexity of Data Reviewed External Data Reviewed: notes.    Details: Reviewed notes from [facility] health care through care everywhere. Labs: ordered.    Details: BMP shows hyperglycemia 225.  No emergent Electra derangement, AKI or gap. Urinalysis shows hematuria and proteinuria, no evidence for infection. I-STAT Chem-8 without emergent abnormalities. Lactic acid pending. Blood cultures pending. COVID/flu panel pending. CBC wnl; without leukocytosis, anemia or thrombocytopenia. Radiology: ordered.    Details: I personally reviewed the patient's right foot x-ray and agree with radiologist interpretation of cortical erosions along the fifth MTP possible osteomyelitis. Discussion of management or test interpretation with external provider(s): Vascular surgery Internal medicine Orthopedic surgery  Risk Prescription drug management. Decision regarding  hospitalization. Risk Details: Risk of IV contrast to evaluate for limb ischemia discussed with patient in detail.  Patient elected to proceed.  IV contrast allergy order set utilized, Solu-Cortef 200 mg and IV Benadryl ordered.  Critical Care Total time providing critical care: 30-74 minutes  ----- 10:09 AM: Patient reassessed, admitting team at bedside.  Patient stated understanding's of risks versus benefits of CT with IV contrast.  Patient elected to proceed with the CT scan with runoffs into the legs to assess for limb ischemia.  He is currently fully alert and oriented well-appearing in no acute distress.  Care taken over by admitting team. Patient seen and evaluated by attending physician Dr. Melina Copa during this visit who agrees w/ plan of care and admission.  Note: Portions of this report may have been transcribed using voice recognition software. Every effort was made to ensure accuracy; however, inadvertent computerized transcription errors may still be present.         Final Clinical Impression(s) / ED Diagnoses Final diagnoses:  Osteomyelitis of right foot, unspecified type ([place])  PVD (peripheral vascular disease) ([place])  Hypertension, unspecified type    Rx / DC Orders ED Discharge Orders     None         Gari Crown 12/28/21 1013    Hayden Rasmussen, MD 12/28/21 2025

## 2021-12-28 NOTE — Progress Notes (Signed)
Pharmacy Antibiotic Note  Antonio [name]. is a 66 y.o. male admitted on 12/28/2021 with sepsis and wound infection .  Pharmacy has been consulted for vancomycin dosing. Pt is afebrile and WBC is WNL. Scr is WNL.   Plan: Vanc 2g IV x 1 then 1500mg  IV Q12H  F/u renal fxn, C&S, clinical status and peak/trough at [facility] F/u continuation of gram neg coverage  Height: 6' (182.9 cm) Weight: 93 kg (205 lb) IBW/kg (Calculated) : 77.6  Temp (24hrs), Avg:97.7 F (36.5 C), Min:97.7 F (36.5 C), Max:97.7 F (36.5 C)  Recent Labs  Lab 12/28/21 0815 12/28/21 0823  WBC 6.9  --   CREATININE 0.84 0.80    Estimated Creatinine Clearance: 103.7 mL/min (by C-G formula based on SCr of 0.8 mg/dL).    Allergies  Allergen Reactions   Iodinated Contrast Media Hives, Rash and Other (See Comments)    Blisters Staph Blisters / staph   Penicillins Other (See Comments)    UNSPECIFIED REACTION  Has patient had a PCN reaction causing immediate rash, facial/tongue/throat swelling, SOB or lightheadedness with hypotension: No Has patient had a PCN reaction causing severe rash involving mucus membranes or skin necrosis: No Has patient had a PCN reaction that required hospitalization: No Has patient had a PCN reaction occurring within the last 10 years: No If all of the above answers are "NO", then may proceed with Cephalosporin use.    Antimicrobials this admission: Vanc 2/2>> CTX x 1 2/2  Dose adjustments this admission: N/A  Microbiology results: Pending  Thank you for allowing pharmacy to be a part of this patients care.  Danayah Smyre, 02/25/22 12/28/2021 9:19 AM

## 2021-12-28 NOTE — H&P (Signed)
NAME:  Antonio Chow., MRN:  [id number], DOB:  1955-12-05, LOS: 0 ADMISSION DATE:  12/28/2021, Primary: Antonio Riches, NP  CHIEF COMPLAINT:  right foot pain   Medical Service: Internal Medicine Teaching Service         Attending Physician: Dr. Hayden Rasmussen, MD    First Contact: Dr. Jeanice Lim Pager: [phone]  Second Contact: Dr. Johnney Ou Pager: [phone]       After Hours (After 5p/  First Contact Pager: [phone]  weekends / holidays): Second Contact Pager: [phone]    History of present illness   Antonio Hartman. is a 66 year old chronically ill male with uncontrolled hypertension, diabetes, PAD s/p LSFA stenting, status post left BKA (2019), and tobacco use disorder who presented to [facility] emergency department today for evaluation of 1 month history of nonhealing right foot ulcer and 4-day history of right leg pain at rest.  He does not recall a particular injury preceding the ulcer formation.  Over the past month, the ulcer has slowly increased in size and has become purulent and malodorous. 4 days ago, he developed right leg pain at rest as well as progressive swelling and redness of his leg. He previously been instructed by his vascular surgeon at [facility] to call their office should he develop a nonhealing ulcer or leg pain at rest however he has not done this, he is unable to give a clear reason why. He denies fever, chills, nausea, vomiting, dizziness, lightheadedness.  He reports that chest and back pain started around 4 days ago as well.  It is constant in nature, not associated with exertion.  Pain is substernal that radiates to his back.  Denies shortness of breath  He currently smokes about 1 pack of cigarettes per day.  He reports that he has not taken any of his home medications for at least several months.  He was unable to give me a reason for this.  Denies financial barriers.  He reported himself as being fairly active.  When I asked him to expand on  this, he notes that she is able to get to a grocery store to buy food and is able to go get his haircut independently.  He wears a prosthesis on the left leg.  He resides in [place] independently and says he has a strong family support system.   Past Medical History  He,  has a past medical history of Coronary artery disease, Diabetes mellitus without complication ([place]), Hyperlipidemia, Hypertension, and Osteomyelitis ([place]) (03/25/2017).   Home Medications     Prior to Admission medications   Medication Sig Start Date End Date Taking? Authorizing Provider  acetaminophen (TYLENOL) 500 MG tablet Take 500 mg by mouth every 6 (six) hours as needed for moderate pain or headache.   Yes [provider]  aspirin EC 81 MG tablet Take 81 mg by mouth daily.   Yes [provider]  atorvastatin (LIPITOR) 40 MG tablet Take 1 tablet (40 mg total) by mouth daily at 6 PM. Patient not taking: Reported on 12/28/2021 03/28/17   Everrett Coombe, MD  insulin aspart (NOVOLOG) 100 UNIT/ML injection Inject 0-20 Units into the skin 3 (three) times daily with meals. Patient not taking: Reported on 12/28/2021 02/20/19   Barb Merino, MD  insulin detemir (LEVEMIR) 100 UNIT/ML injection Inject 0.15 mLs (15 Units total) into the skin 2 (two) times daily. Patient not taking: Reported on 12/28/2021 02/20/19   Barb Merino, MD  oxyCODONE (OXY IR/ROXICODONE) 5 MG  immediate release tablet Take 1 tablet (5 mg total) by mouth every 6 (six) hours as needed for severe pain. Patient not taking: Reported on 12/28/2021 02/20/19   Elgergawy, Silver Huguenin, MD    Allergies    Allergies as of 12/28/2021 - Review Complete 12/28/2021  Allergen Reaction Noted   Iodinated contrast media Hives, Rash, and Other (See Comments) 07/27/2015   Penicillins Other (See Comments) 12/28/2015    Social History   reports that he quit smoking about 3 years ago. His smoking use included cigarettes. He has a 70.00 pack-year smoking history. He has  never used smokeless tobacco. He reports current alcohol use. He reports that he does not use drugs.   Family History   His family history includes CAD (age of onset: 7) in his sister; Diabetes in his mother; Emphysema in his father.   ROS  10 point review of systems negative unless stated in the HPI.  Objective   Blood pressure (!) 206/98, pulse 72, temperature 97.7 F (36.5 C), temperature source Oral, resp. rate (!) 23, height 6' (1.829 m), SpO2 95 %.    General: Chronically ill-appearing male, poor hygiene, appears older than stated age Eyes: No scleral icterus or conjunctival injection HEENT: Moist mucous membranes, tracheostomy scar present Cardiac: Heart regular rate and rhythm.  +3 pitting edema to the right lower extremity.  Pedal pulses not palpable.  Right lower extremity normal temperature to touch Pulm: Breathing comfortably on room air.  Has a productive cough.  Lung sounds are clear throughout GI: Abdomen soft, nontender, nondistended. MSK: Left BKA.   +3 edema in the right lower extremity extending to mid thigh.   Skin: No open lesions or wounds on the left stump.  Erythema present from the right foot extending superiorly to the mid calf.  Purulent ulceration with eschar is present on the lateral aspect of the fifth metatarsal with malodorous discharge. Neuro: Diminished sensation of the RLE  Significant Diagnostic Tests:   Right foot xray:  Soft tissue wound to the fifth metatarsal head with new cortical erosion on both sides of the fifth metatarsophalangeal joint indicating osteomyelitis.  Labs    CBC Latest Ref Rng & Units 12/28/2021 12/28/2021 02/15/2019  WBC 4.0 - 10.5 K/uL - 6.9 10.1  Hemoglobin 13.0 - 17.0 g/dL 16.7 16.6 11.3(L)  Hematocrit 39.0 - 52.0 % 49.0 48.8 36.2(L)  Platelets 150 - 400 K/uL - 222 264   BMP Latest Ref Rng & Units 12/28/2021 12/28/2021 02/15/2019  Glucose 70 - 99 mg/dL 221(H) 225(H) 130(H)  BUN 8 - 23 mg/dL 7(L) 7(L) 25(H)  Creatinine 0.61 -  1.24 mg/dL 0.80 0.84 0.59(L)  Sodium 135 - 145 mmol/L 138 136 136  Potassium 3.5 - 5.1 mmol/L 4.0 3.9 4.0  Chloride 98 - 111 mmol/L 97(L) 98 97(L)  CO2 22 - 32 mmol/L - 29 30  Calcium 8.9 - 10.3 mg/dL - 8.8(L) 9.1    Summary  66 year old male with uncontrolled hypertension, hyperlipidemia, diabetes, PAD, and history of left BKA (2019) presenting with right lower extremity pain associated with right foot ulcer. He is being admitted for medical and possible surgical management of osteomyelitis.   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 Severe PAD  s/p LSFA stenting (2013). Follows with vascular surgery at [facility] (last appt 06/2020) Medication non-adherence, uncontrolled risk factors Hx of Contrast allergy--hives  Vanc and cefepime Follow blood cultures Follow up CT with runoff Patient agreeable to proceeding with CT Premedicated with solumedrol and benadryl in the ED Appreciate vascular and orthopedic surgery consultations Plan for cath lab tomorrow for RLE angio NPO at [facility] High risk for needing amputation Pain Management: oxycodone 22m q6h prn Bowel Management: senakot and miralax  Chest pain No acute ischemic changes appreciable on CT. Will need to aortic dissection given his uncontrolled blood pressure Follow up CTA aorta  Uncontrolled hypertension. Blood pressure 224/101 on ED arrival. Responded well to 241mIV labetalol in the ED. Will need to hold off on on ACE/ARB in the setting of contrast bolus Start amlodipine and coreg Suspect this is chronic so will need slow blood pressure taper over period of weeks. 24H MAP goal ~10075m  Uncontrolled type 2 Diabetes Mellitus. Nonadherent to home medications. Last A1C 11.9 in 2019. Check A1C 10U semglee, moderate SSI. Adjust as needed  CAD. Non-adherent to home medications.   History of MI s/p PCI (>10 years ago) Check lipids Restart lipitor  Left BKA--fall precautions. Prosthesis use.   Tobacco use disorder--current 1ppd smoker Declines nicotine patch Encourage smoking cessation  Microscopic hematuria. 11-20 RBC on microscopy.  Will need referral to urology after discharge for high risk hematuria (tobacco hx)  Chronic normocytic anemia. Stable. Monitor intermittently.   Best practice:  CODE STATUS: FULL DVT for prophylaxis: lovenox Social considerations/Family communication: Declined by patient Dispo: Admit patient to Inpatient with expected length of stay greater than 2 midnights.   RylMitzi HansenD Internal Medicine Resident PGY-3 [facility] Pager: #[phone]/2023 9:15 AM

## 2021-12-28 NOTE — Plan of Care (Signed)
  Problem: Education: Goal: Knowledge of General Education information will improve Description Including pain rating scale, medication(s)/side effects and non-pharmacologic comfort measures Outcome: Progressing   Problem: Health Behavior/Discharge Planning: Goal: Ability to manage health-related needs will improve Outcome: Progressing   

## 2021-12-28 NOTE — ED Notes (Signed)
visitor at bedside

## 2021-12-28 NOTE — Consult Note (Signed)
Hospital Consult    Reason for Consult: Right foot wounds with nonpalpable pulse Requesting Physician: ED MRN #:  [id number]  History of Present Illness: This is a 66 y.o. male who presented to the [facility] ED with a 4-week history of right lower extremity pain, edema, induration.  The majority of the patient's [facility], including his below-knee amputation.  His right first toe ray amputation was completed in [place].   On exam, Antonio BoomDaniel was resting comfortably.  He noted a 4-week history of swelling and pain in the right leg.  He is nonambulatory at baseline, and lives in his mother's basement.  He is able to turn and pivot and use a Hoveround wheelchair.  He is also able to drive using his right foot.  Antonio BoomDaniel denies history of fevers, chills.  He stated the redness appreciated in the right leg is worsened over the last week.  He continues to smoke 1 pack/day.  Past Medical History:  Diagnosis Date   Coronary artery disease    Diabetes mellitus without complication (HCC)    Hyperlipidemia    Hypertension    Osteomyelitis (HCC) 03/25/2017   RT FOOT    Past Surgical History:  Procedure Laterality Date   AMPUTATION Right 03/27/2017   Procedure: 1st Ray Amputation Right Foot;  Surgeon: Nadara MustardMarcus Duda V, MD;  [facility];  Service: Orthopedics;  Laterality: Right;   APPENDECTOMY     BELOW KNEE LEG AMPUTATION Left    CARDIAC CATHETERIZATION     CORONARY STENT INTERVENTION  2005   Great toe amputation right.     I & D EXTREMITY Left 12/30/2015   Procedure: IRRIGATION AND DEBRIDEMENT LEFT FOOT, TRANSMETATARSAL AMPUTATION WITH APPLICATION OF ANTIBIOTIC BEADS AND WOUND VAC;  Surgeon: Nadara MustardMarcus Duda V, MD;  Location: [facility] OR;  Service: Orthopedics;  Laterality: Left;   IR GASTROSTOMY TUBE MOD SED  12/23/2018   TONSILLECTOMY     TRACHEOSTOMY  11/2018    Allergies  Allergen Reactions   Iodinated Contrast Media Hives, Rash and Other (See Comments)     Blisters Staph Blisters / staph   Penicillins Other (See Comments)    UNSPECIFIED REACTION  Has patient had a PCN reaction causing immediate rash, facial/tongue/throat swelling, SOB or lightheadedness with hypotension: No Has patient had a PCN reaction causing severe rash involving mucus membranes or skin necrosis: No Has patient had a PCN reaction that required hospitalization: No Has patient had a PCN reaction occurring within the last 10 years: No If all of the above answers are "NO", then may proceed with Cephalosporin use.    Prior to Admission medications   Medication Sig Start Date End Date Taking? Authorizing Provider  acetaminophen (TYLENOL) 500 MG tablet Take 500 mg by mouth every 6 (six) hours as needed for moderate pain or headache.   Yes [provider]  aspirin EC 81 MG tablet Take 81 mg by mouth daily.   Yes [provider]  atorvastatin (LIPITOR) 40 MG tablet Take 1 tablet (40 mg total) by mouth daily at 6 PM. Patient not taking: Reported on 12/28/2021 03/28/17   Howard PouchFeng, Lauren, MD  insulin aspart (NOVOLOG) 100 UNIT/ML injection Inject 0-20 Units into the skin 3 (three) times daily with meals. Patient not taking: Reported on 12/28/2021 02/20/19   Dorcas CarrowGhimire, Kuber, MD  insulin detemir (LEVEMIR) 100 UNIT/ML injection Inject 0.15 mLs (15 Units total) into the skin 2 (two) times daily. Patient not taking: Reported on 12/28/2021 02/20/19  Dorcas Carrow, MD  oxyCODONE (OXY IR/ROXICODONE) 5 MG immediate release tablet Take 1 tablet (5 mg total) by mouth every 6 (six) hours as needed for severe pain. Patient not taking: Reported on 12/28/2021 02/20/19   Elgergawy, Leana Roe, MD    Social History   Socioeconomic History   Marital status: Divorced    Spouse name: Not on file   Number of children: Not on file   Years of education: Not on file   Highest education level: Not on file  Occupational History   Not on file  Tobacco Use   Smoking status: Former    Packs/day: 2.00     Years: 35.00    Pack years: 70.00    Types: Cigarettes    Quit date: 11/28/2018    Years since quitting: 3.0   Smokeless tobacco: Never  Vaping Use   Vaping Use: Former  Substance and Sexual Activity   Alcohol use: Yes    Comment: beers occasionally   Drug use: No   Sexual activity: Not on file  Other Topics Concern   Not on file  Social History Narrative   Lives with mother.  He has three children.     Social Determinants of Health   Financial Resource Strain: Not on file  Food Insecurity: Not on file  Transportation Needs: Not on file  Physical Activity: Not on file  Stress: Not on file  Social Connections: Not on file  Intimate Partner Violence: Not on file     Family History  Problem Relation Age of Onset   Diabetes Mother    Emphysema Father    CAD Sister 55       Died of MI after flu    ROS: Otherwise negative unless mentioned in HPI  Physical Examination  Vitals:   12/28/21 1000 12/28/21 1100  BP: (!) 169/56 (!) 168/82  Pulse: 66 70  Resp: 16 19  Temp:    SpO2: 93% 93%   Body mass index is 27.8 kg/m.  General:  WDWN in NAD Gait: Not observed HENT: WNL, normocephalic Pulmonary: normal non-labored breathing, without Rales, rhonchi,  wheezing Cardiac: regula Abdomen: soft, NT/ND, no masses Skin: without rashes Vascular Exam/Pulses: 2+ left femoral, nonpalpable right femoral, monophasic DP right foot Extremities: with ischemic changes, without Gangrene , with cellulitis; with open wounds;  Left BKA Musculoskeletal: no muscle wasting or atrophy  Neurologic: A&O X 3;  No focal weakness or paresthesias are detected; speech is fluent/normal Psychiatric:  The pt has Normal affect. Lymph:  Unremarkable  CBC    Component Value Date/Time   WBC 6.9 12/28/2021 0815   RBC 5.68 12/28/2021 0815   HGB 16.7 12/28/2021 0823   HCT 49.0 12/28/2021 0823   PLT 222 12/28/2021 0815   MCV 85.9 12/28/2021 0815   MCH 29.2 12/28/2021 0815   MCHC 34.0  12/28/2021 0815   RDW 12.4 12/28/2021 0815   LYMPHSABS 1.9 12/28/2021 0815   MONOABS 0.4 12/28/2021 0815   EOSABS 0.1 12/28/2021 0815   BASOSABS 0.0 12/28/2021 0815    BMET    Component Value Date/Time   NA 138 12/28/2021 0823   K 4.0 12/28/2021 0823   CL 97 (L) 12/28/2021 0823   CO2 29 12/28/2021 0815   GLUCOSE 221 (H) 12/28/2021 0823   BUN 7 (L) 12/28/2021 0823   CREATININE 0.80 12/28/2021 0823   CALCIUM 8.8 (L) 12/28/2021 0815   GFRNONAA >60 12/28/2021 0815   GFRAA >60 02/15/2019 0455    COAGS: Lab  Results  Component Value Date   INR 1.26 12/23/2018   INR 1.17 12/10/2018   INR 0.97 11/28/2018     Non-Invasive Vascular Imaging:   pending   ASSESSMENT/PLAN: This is a 66 y.o. male with history of left-sided BKA, who presents now with a 1 month history of right lower extremity rest pain and wounds located at the fifth metatarsal.  The patient does not have acute limb ischemia, this is chronic.  On physical exam, he had a monophasic dorsalis pedis signal.  X-ray demonstrates osteomyelitis at the fifth metatarsal.  Although the patient is nonambulatory, he uses the leg to turn and pivot, and continues to exercise some independence as he is able to drive.  I had a long conversation with Antonio [name], regarding complex limb salvage versus primary amputation.  He would like to do everything possible to save his leg.  I told Nil, with his current comorbidities, as well as tissue loss there is a high risk of limb loss at this point.  I will discuss the above with Dr. Lajoyce Corners, specifically the osteomyelitis and musculoskeletal foot salvage options.  I have booked [facility] for the cardiac Cath Lab tomorrow for right lower extremity angiography.  This is pending CT angio as I could not palpate a pulse in his right groin.  Please make n.p.o. at midnight.  We will discuss the patient with Dr. Lajoyce Corners and update this note accordingly   J. Gillis Santa MD [facility] [phone] 12/28/2021  12:54 PM

## 2021-12-28 NOTE — ED Notes (Signed)
Admitting paged to RN per her request 

## 2021-12-28 NOTE — Progress Notes (Signed)
Pt with slight ST elevation on bedside monitor. Pt had same ST elevation on ED 12 lead EKG. Pt denies any chest pain, SOB or discomfort at this time. Pt stated he had stents placed in the past.

## 2021-12-28 NOTE — ED Triage Notes (Signed)
Pt arrived via [facility] w c/o right foot ulcer, back pain, and leg pain. Ulcer noted approx 4 weeks ago

## 2021-12-29 ENCOUNTER — Inpatient Hospital Stay (HOSPITAL_COMMUNITY): Payer: Medicaid Other

## 2021-12-29 ENCOUNTER — Encounter (HOSPITAL_COMMUNITY): Payer: Self-pay | Admitting: Vascular Surgery

## 2021-12-29 ENCOUNTER — Inpatient Hospital Stay (HOSPITAL_COMMUNITY)
Admission: EM | Disposition: A | Discharge status: Rehabilitation Facility | Payer: Self-pay | Source: Home / Self Care | Attending: Internal Medicine

## 2021-12-29 DIAGNOSIS — E1165 Type 2 diabetes mellitus with hyperglycemia: Secondary | ICD-10-CM

## 2021-12-29 DIAGNOSIS — Z01810 Encounter for preprocedural cardiovascular examination: Secondary | ICD-10-CM

## 2021-12-29 DIAGNOSIS — I739 Peripheral vascular disease, unspecified: Secondary | ICD-10-CM

## 2021-12-29 DIAGNOSIS — L97519 Non-pressure chronic ulcer of other part of right foot with unspecified severity: Secondary | ICD-10-CM

## 2021-12-29 DIAGNOSIS — I700 Atherosclerosis of aorta: Secondary | ICD-10-CM

## 2021-12-29 DIAGNOSIS — E11621 Type 2 diabetes mellitus with foot ulcer: Secondary | ICD-10-CM

## 2021-12-29 DIAGNOSIS — M869 Osteomyelitis, unspecified: Secondary | ICD-10-CM

## 2021-12-29 DIAGNOSIS — I96 Gangrene, not elsewhere classified: Secondary | ICD-10-CM

## 2021-12-29 DIAGNOSIS — Z794 Long term (current) use of insulin: Secondary | ICD-10-CM

## 2021-12-29 DIAGNOSIS — I70235 Atherosclerosis of native arteries of right leg with ulceration of other part of foot: Secondary | ICD-10-CM

## 2021-12-29 DIAGNOSIS — I10 Essential (primary) hypertension: Secondary | ICD-10-CM | POA: Diagnosis not present

## 2021-12-29 HISTORY — PX: LOWER EXTREMITY ANGIOGRAPHY: CATH118251

## 2021-12-29 LAB — GLUCOSE, CAPILLARY
Glucose-Capillary: 237 mg/dL — ABNORMAL HIGH (ref 70–99)
Glucose-Capillary: 313 mg/dL — ABNORMAL HIGH (ref 70–99)
Glucose-Capillary: 322 mg/dL — ABNORMAL HIGH (ref 70–99)
Glucose-Capillary: 281 mg/dL — ABNORMAL HIGH (ref 70–99)
Glucose-Capillary: 386 mg/dL — ABNORMAL HIGH (ref 70–99)

## 2021-12-29 LAB — HIV ANTIBODY (ROUTINE TESTING W REFLEX): HIV Screen 4th Generation wRfx: NONREACTIVE

## 2021-12-29 LAB — CBC
HCT: 43.3 % (ref 39.0–52.0)
Hemoglobin: 15 g/dL (ref 13.0–17.0)
MCH: 30.1 pg (ref 26.0–34.0)
MCHC: 34.6 g/dL (ref 30.0–36.0)
MCV: 86.8 fL (ref 80.0–100.0)
Platelets: 216 10*3/uL (ref 150–400)
RBC: 4.99 MIL/uL (ref 4.22–5.81)
RDW: 12.7 % (ref 11.5–15.5)
WBC: 6.8 10*3/uL (ref 4.0–10.5)
nRBC: 0 % (ref 0.0–0.2)

## 2021-12-29 LAB — BASIC METABOLIC PANEL
Anion gap: 8 (ref 5–15)
BUN: 16 mg/dL (ref 8–23)
CO2: 27 mmol/L (ref 22–32)
Calcium: 8.2 mg/dL — ABNORMAL LOW (ref 8.9–10.3)
Chloride: 102 mmol/L (ref 98–111)
Creatinine, Ser: 1.04 mg/dL (ref 0.61–1.24)
GFR, Estimated: >60 mL/min (ref >60)
Glucose, Bld: 210 mg/dL — ABNORMAL HIGH (ref 70–99)
Potassium: 3.9 mmol/L (ref 3.5–5.1)
Sodium: 137 mmol/L (ref 135–145)

## 2021-12-29 LAB — SURGICAL PCR SCREEN
MRSA, PCR: NEGATIVE
Staphylococcus aureus: NEGATIVE

## 2021-12-29 SURGERY — LOWER EXTREMITY ANGIOGRAPHY
Anesthesia: LOCAL | Laterality: Right

## 2021-12-29 MED ORDER — INSULIN ASPART 100 UNIT/ML IJ SOLN
0.0000 [IU] | Freq: Three times a day (TID) | INTRAMUSCULAR | Status: DC
  Administered 2021-12-29 – 2021-12-30 (×2): 8 [IU] via SUBCUTANEOUS

## 2021-12-29 MED ORDER — FENTANYL CITRATE (PF) 100 MCG/2ML IJ SOLN
INTRAMUSCULAR | Status: AC
  Filled 2021-12-29: qty 2

## 2021-12-29 MED ORDER — HEPARIN (PORCINE) IN NACL 1000-0.9 UT/500ML-% IV SOLN
INTRAVENOUS | Status: AC
  Filled 2021-12-29: qty 1000

## 2021-12-29 MED ORDER — HYDRALAZINE HCL 20 MG/ML IJ SOLN
INTRAMUSCULAR | Status: DC | PRN
  Administered 2021-12-29: 10 mg via INTRAVENOUS

## 2021-12-29 MED ORDER — INSULIN ASPART 100 UNIT/ML IJ SOLN
0.0000 [IU] | Freq: Every day | INTRAMUSCULAR | Status: DC
  Administered 2021-12-29: 5 [IU] via SUBCUTANEOUS
  Administered 2022-01-01: 2 [IU] via SUBCUTANEOUS
  Administered 2022-01-03: 1 [IU] via SUBCUTANEOUS

## 2021-12-29 MED ORDER — LABETALOL HCL 5 MG/ML IV SOLN: 10.0000 mg | INTRAVENOUS | Status: DC | PRN

## 2021-12-29 MED ORDER — DIPHENHYDRAMINE HCL 50 MG/ML IJ SOLN
25.0000 mg | Freq: Once | INTRAMUSCULAR | Status: DC
Start: 2021-12-29 — End: 2021-12-29

## 2021-12-29 MED ORDER — DIPHENHYDRAMINE HCL 50 MG/ML IJ SOLN
25.0000 mg | Freq: Once | INTRAMUSCULAR | Status: AC
  Administered 2021-12-29: 25 mg via INTRAVENOUS
  Filled 2021-12-29: qty 1

## 2021-12-29 MED ORDER — METHYLPREDNISOLONE SODIUM SUCC 125 MG IJ SOLR
125.0000 mg | Freq: Once | INTRAMUSCULAR | Status: DC
Start: 2021-12-29 — End: 2021-12-29

## 2021-12-29 MED ORDER — SODIUM CHLORIDE 0.9 % IV SOLN: INTRAVENOUS | Status: DC

## 2021-12-29 MED ORDER — MIDAZOLAM HCL 2 MG/2ML IJ SOLN
INTRAMUSCULAR | Status: AC
  Filled 2021-12-29: qty 2

## 2021-12-29 MED ORDER — INSULIN ASPART 100 UNIT/ML IJ SOLN
3.0000 [IU] | Freq: Three times a day (TID) | INTRAMUSCULAR | Status: DC
  Administered 2021-12-29 (×2): 3 [IU] via SUBCUTANEOUS

## 2021-12-29 MED ORDER — FENTANYL CITRATE (PF) 100 MCG/2ML IJ SOLN
INTRAMUSCULAR | Status: DC | PRN
  Administered 2021-12-29: 50 ug via INTRAVENOUS

## 2021-12-29 MED ORDER — IODIXANOL 320 MG/ML IV SOLN
INTRAVENOUS | Status: DC | PRN
  Administered 2021-12-29: 115 mL

## 2021-12-29 MED ORDER — SODIUM CHLORIDE 0.9 % WEIGHT BASED INFUSION: 1.0000 mL/kg/h | INTRAVENOUS | Status: AC

## 2021-12-29 MED ORDER — SODIUM CHLORIDE 0.9% FLUSH
3.0000 mL | Freq: Two times a day (BID) | INTRAVENOUS | Status: DC
  Administered 2021-12-30 – 2021-12-31 (×2): 3 mL via INTRAVENOUS

## 2021-12-29 MED ORDER — HYDRALAZINE HCL 20 MG/ML IJ SOLN: 5.0000 mg | INTRAMUSCULAR | Status: DC | PRN

## 2021-12-29 MED ORDER — LABETALOL HCL 5 MG/ML IV SOLN
INTRAVENOUS | Status: DC | PRN
  Administered 2021-12-29: 10 mg via INTRAVENOUS

## 2021-12-29 MED ORDER — METRONIDAZOLE 500 MG/100ML IV SOLN
500.0000 mg | Freq: Two times a day (BID) | INTRAVENOUS | Status: DC
  Administered 2021-12-29 – 2022-01-02 (×9): 500 mg via INTRAVENOUS
  Filled 2021-12-29 (×9): qty 100

## 2021-12-29 MED ORDER — ONDANSETRON HCL 4 MG/2ML IJ SOLN: 4.0000 mg | Freq: Four times a day (QID) | INTRAMUSCULAR | Status: DC | PRN

## 2021-12-29 MED ORDER — HEPARIN (PORCINE) IN NACL 1000-0.9 UT/500ML-% IV SOLN
INTRAVENOUS | Status: DC | PRN
  Administered 2021-12-29 (×2): 500 mL

## 2021-12-29 MED ORDER — SODIUM CHLORIDE 0.9% FLUSH: 3.0000 mL | INTRAVENOUS | Status: DC | PRN

## 2021-12-29 MED ORDER — LABETALOL HCL 5 MG/ML IV SOLN
INTRAVENOUS | Status: AC
  Filled 2021-12-29: qty 4

## 2021-12-29 MED ORDER — HYDRALAZINE HCL 10 MG PO TABS
10.0000 mg | ORAL_TABLET | Freq: Three times a day (TID) | ORAL | Status: DC
  Administered 2021-12-29 – 2022-01-03 (×14): 10 mg via ORAL
  Filled 2021-12-29 (×14): qty 1

## 2021-12-29 MED ORDER — METHYLPREDNISOLONE SODIUM SUCC 125 MG IJ SOLR
125.0000 mg | Freq: Once | INTRAMUSCULAR | Status: AC
  Administered 2021-12-29: 125 mg via INTRAVENOUS
  Filled 2021-12-29: qty 2

## 2021-12-29 MED ORDER — LIDOCAINE HCL (PF) 1 % IJ SOLN
INTRAMUSCULAR | Status: AC
  Filled 2021-12-29: qty 30

## 2021-12-29 MED ORDER — SODIUM CHLORIDE 0.9 % IV SOLN: 250.0000 mL | INTRAVENOUS | Status: DC | PRN

## 2021-12-29 MED ORDER — INSULIN GLARGINE-YFGN 100 UNIT/ML ~~LOC~~ SOLN: 18.0000 [IU] | Freq: Every day | SUBCUTANEOUS | Status: DC

## 2021-12-29 MED ORDER — MIDAZOLAM HCL 2 MG/2ML IJ SOLN
INTRAMUSCULAR | Status: DC | PRN
  Administered 2021-12-29: 1 mg via INTRAVENOUS

## 2021-12-29 MED ORDER — CARVEDILOL 12.5 MG PO TABS
12.5000 mg | ORAL_TABLET | Freq: Two times a day (BID) | ORAL | Status: DC
  Administered 2021-12-29 – 2022-01-02 (×8): 12.5 mg via ORAL
  Filled 2021-12-29 (×9): qty 1

## 2021-12-29 MED ORDER — INSULIN GLARGINE-YFGN 100 UNIT/ML ~~LOC~~ SOLN
18.0000 [IU] | Freq: Every day | SUBCUTANEOUS | Status: DC
  Administered 2021-12-29: 18 [IU] via SUBCUTANEOUS
  Filled 2021-12-29 (×3): qty 0.18

## 2021-12-29 MED ORDER — HYDRALAZINE HCL 20 MG/ML IJ SOLN
INTRAMUSCULAR | Status: AC
  Filled 2021-12-29: qty 1

## 2021-12-29 MED ORDER — HYDROMORPHONE HCL 1 MG/ML IJ SOLN
INTRAMUSCULAR | Status: AC
  Filled 2021-12-29: qty 0.5

## 2021-12-29 SURGICAL SUPPLY — 11 items
CATH OMNI FLUSH 5F 65CM (CATHETERS) ×1 IMPLANT
DEVICE TORQUE H2O (MISCELLANEOUS) ×1 IMPLANT
GUIDEWIRE ANGLED .035X150CM (WIRE) ×1 IMPLANT
KIT MICROPUNCTURE NIT STIFF (SHEATH) ×1 IMPLANT
KIT PV (KITS) ×2 IMPLANT
SHEATH PINNACLE 5F 10CM (SHEATH) ×1 IMPLANT
SHEATH PROBE COVER 6X72 (BAG) ×1 IMPLANT
SYR MEDRAD MARK V 150ML (SYRINGE) ×1 IMPLANT
TRANSDUCER W/STOPCOCK (MISCELLANEOUS) ×2 IMPLANT
TRAY PV CATH (CUSTOM PROCEDURE TRAY) ×2 IMPLANT
WIRE BENTSON .035X145CM (WIRE) ×1 IMPLANT

## 2021-12-29 NOTE — Progress Notes (Signed)
Site area: Left groin a 5 french arterial sheath was removed   Site Prior to Removal:  Level 0  Pressure Applied For 20 MINUTES    Bedrest Beginning at 0930am X 4 hours  Manual:   No.  Patient Status During Pull:  stable  Post Pull Groin Site:  Level 0  Post Pull Instructions Given:  Yes.    Post Pull Pulses Present:  Yes.    Dressing Applied:  Yes.    Comments:

## 2021-12-29 NOTE — Progress Notes (Signed)
PHARMACIST LIPID MONITORING   Antonio Lapoint. is a 66 y.o. male admitted on 12/28/2021 with severe PAD/osteomyelitis.  Pharmacy has been consulted to optimize lipid-lowering therapy with the indication of secondary prevention for clinical ASCVD.  Recent Labs:  Lipid Panel (last 6 months):   Lab Results  Component Value Date   CHOL 206 (H) 12/28/2021   TRIG 338 (H) 12/28/2021   HDL 26 (L) 12/28/2021   CHOLHDL 7.9 12/28/2021   VLDL 68 (H) 12/28/2021   LDLCALC 112 (H) 12/28/2021    Hepatic function panel (last 6 months):   No results found for: AST, ALT, ALKPHOS, BILITOT, BILIDIR, IBILI  SCr (since admission):   Serum creatinine: 1.04 mg/dL 46/96/29 5284 Estimated creatinine clearance: 79.8 mL/min  Current therapy and lipid therapy tolerance Current lipid-lowering therapy: atorvastatin 40 mg PO daily  Previous lipid-lowering therapies (if applicable): atorvastatin 40 mg PO daily (patient non-compliant with medications)  Documented or reported allergies or intolerances to lipid-lowering therapies (if applicable): N/A   Assessment:   Patient agrees with changes to lipid-lowering therapy  Plan:    1.Statin intensity (high intensity recommended for all patients regardless of the LDL):  Lipitor 40 mg PO daily started this admission   2.Add ezetimibe (if any one of the following):   Not indicated at this time.  3.Refer to lipid clinic:   No  4.Follow-up with:  Primary care provider - Dema Severin, NP  5.Follow-up labs after discharge:  Changes in lipid therapy were made. Check a lipid panel in 8-12 weeks then annually.       Antonio [name], PharmD PGY-1 Acute Care Resident  12/29/2021 10:55 AM

## 2021-12-29 NOTE — Progress Notes (Addendum)
Referral received regarding PCP needs. Pt has [facility] plan- CM went to [employer] and searched for providers in [place]. List printed for both Internal Medicine and Family Medicine providers in that area that take pt's plan. List provided to patient at the bedside. Patient to follow up.  F/u appointment also made with [facility] as backup if pt unable to secure appointment - but can be canceled prior to discharge if pt does not want to f/u at [facility].

## 2021-12-29 NOTE — Progress Notes (Addendum)
Subjective: I seen and evaluated Antonio [name] at bedside. He was lying comfortably in bed. He reports some soreness in the right foot. He denies chest pain or SOB.  Objective:  Vital signs in last 24 hours: Vitals:   12/28/21 1716 12/28/21 1727 12/28/21 2105 12/29/21 0339  BP:  (!) 187/81 (!) 158/87 (!) 192/79  Pulse:  80 77 80  Resp:   19 17  Temp: 97.6 F (36.4 C) 97.8 F (36.6 C) 98.9 F (37.2 C) (!) 97.5 F (36.4 C)  TempSrc: Oral Oral Oral Oral  SpO2:  95% 94% 97%  Weight:      Height:       CBC Latest Ref Rng & Units 12/29/2021 12/28/2021 12/28/2021  WBC 4.0 - 10.5 K/uL 6.8 - 6.9  Hemoglobin 13.0 - 17.0 g/dL 15.0 16.7 16.6  Hematocrit 39.0 - 52.0 % 43.3 49.0 48.8  Platelets 150 - 400 K/uL 216 - 222   BMP Latest Ref Rng & Units 12/29/2021 12/28/2021 12/28/2021  Glucose 70 - 99 mg/dL 210(H) 221(H) 225(H)  BUN 8 - 23 mg/dL 16 7(L) 7(L)  Creatinine 0.61 - 1.24 mg/dL 1.04 0.80 0.84  Sodium 135 - 145 mmol/L 137 138 136  Potassium 3.5 - 5.1 mmol/L 3.9 4.0 3.9  Chloride 98 - 111 mmol/L 102 97(L) 98  CO2 22 - 32 mmol/L 27 - 29  Calcium 8.9 - 10.3 mg/dL 8.2(L) - 8.8(L)    CT Angio Aortobifemoral W and/or Wo Contrast  Result Date: 12/28/2021 CLINICAL DATA:  66 year old male with history of peripheral artery disease presenting with right fifth metatarsal osteomyelitis. EXAM: CT ANGIOGRAPHY OF ABDOMINAL AORTA WITH ILIOFEMORAL RUNOFF TECHNIQUE: Multidetector CT imaging of the abdomen, pelvis and lower extremities was performed using the standard protocol during bolus administration of intravenous contrast. Multiplanar CT image reconstructions and MIPs were obtained to evaluate the vascular anatomy. RADIATION DOSE REDUCTION: This exam was performed according to the departmental dose-optimization program which includes automated exposure control, adjustment of the mA and/or kV according to patient size and/or use of iterative reconstruction technique. CONTRAST:  182mL OMNIPAQUE IOHEXOL 350 MG/ML SOLN  COMPARISON:  None. FINDINGS: VASCULAR Aorta: The abdominal aorta is normal in caliber and patent throughout. There is significant infrarenal fibrofatty and calcific atherosclerotic disease resulting in approximately 50% focal luminal stenosis immediately inferior to the level of the renal arteries. Celiac: Patent without evidence of aneurysm, dissection, vasculitis or significant stenosis. SMA: Patent without evidence of aneurysm, dissection, vasculitis or significant stenosis. Renals: Dual bilateral renal arteries. The inferior right renal artery demonstrates focal high-grade ostial stenosis secondary to atherosclerotic plaque. The remaining renal arteries appear patent. IMA: Patent without evidence of aneurysm, dissection, vasculitis or significant stenosis. RIGHT Lower Extremity Inflow: Common, internal and external iliac arteries are patent. Near circumferential fibrofatty and calcific atherosclerotic changes with at least mild multifocal stenoses. Suggestion of severe focal stenosis at the level of the right inguinal ligament (series 4, image 131). Outflow: The common femoral artery is patent with circumferential atherosclerotic calcifications resulting in at least mild multifocal stenoses. The profunda is patent. The superficial femoral artery is patent throughout with near circumferential coarse atherosclerotic calcifications resulting in at least mild multifocal stenoses. The popliteal artery is patent throughout. Runoff: The anterior tibial artery is patent throughout. Focal short segment occlusion of the proximal tibioperoneal trunk with reconstitution via the peroneal artery which is patent to the level of the ankle. The posterior tibial artery appears occluded throughout. LEFT Lower Extremity Common iliac artery is patent. Severe  ostial stenosis of the internal iliac artery secondary to atherosclerotic plaque. Coarse circumferential atherosclerotic plaque throughout the external iliac arteries [place] at  least moderate multifocal stenoses. The left common femoral artery is patent. The profundus is patent. Chronic appearing total occlusion of indwelling left superficial femoral artery stent. Review of the MIP images confirms the above findings. NON-VASCULAR Lower chest: No acute abnormality. Hepatobiliary: Mildly decreased attenuation of the hepatic parenchyma. Smooth contour normal size. No focal lesions. The gallbladder is present unremarkable. No intra or extrahepatic biliary ductal dilation. Pancreas: Unremarkable. No pancreatic ductal dilatation or surrounding inflammatory changes. Spleen: No splenic injury or perisplenic hematoma. Adrenals/Urinary Tract: Adrenal glands are unremarkable. Right lower pole anterior cortical simple renal cysts measuring approximately 13 mm in maximum axial diameter. Kidneys are otherwise normal, without renal calculi, focal lesion, or hydronephrosis. Bladder is unremarkable. Stomach/Bowel: Stomach is within normal limits. Appendix is not definitively visualized. No evidence of bowel wall thickening, distention, or inflammatory changes. Lymphatic: No abdominopelvic lymphadenopathy. Reproductive: Prostate is unremarkable. Other: No abdominal wall hernia or abnormality. No abdominopelvic ascites. Musculoskeletal: Cortical destruction of the right fifth proximal middle, and distal phalanx compatible with known osteomyelitis. Diffuse right lower extremity edema, most prominent below the knee. No evidence of subcutaneous gas or discrete fluid collections. Status post left below the knee amputation. No evidence acute fracture or malalignment. IMPRESSION: VASCULAR 1. Advanced aortoiliac atherosclerotic disease resulting in approximately 50% focal stenosis of the infrarenal abdominal aorta. 2. Severe focal stenosis of the distal right external iliac artery at the level of the inguinal ligament. Patent right lower extremity runoff vessels with near circumferential atherosclerotic  calcifications. Proximal right tibioperoneal trunk occlusion with two vessel runoff to the level of the ankle via the anterior tibial and peroneal arteries. 3. Chronic occlusion of the indwelling left superficial femoral artery stent. NON-VASCULAR 1. Cortical destruction of the right fifth phalanges in keeping with history of osteomyelitis. Diffuse right lower extremity edema, was prominent below the knee. 2. Mild hepatic steatosis. Marliss Coots, MD [facility] Electronically Signed   By: Marliss Coots M.D.   On: 12/28/2021 16:16   DG Foot Complete Right  Result Date: 12/28/2021 CLINICAL DATA:  Lateral foot ulceration. Ulceration is all over right foot. 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 Is irregularity of the soft tissues lateral to the fifth metatarsal head. In this region there is moderate cortical destruction of the base of the proximal phalanx and the distal aspect of the metatarsal head of the fifth ray. There is resultant mild medial subluxation of fifth toe proximal phalanx. There is again apparent attenuation of the distal aspect of the cuneiforms and cuboid and dorsal subluxation of the second through fifth metatarsals (and possibly the remaining base of the first metatarsal). IMPRESSION:: IMPRESSION: 1. Soft tissue wound lateral to the fifth metatarsal head with new cortical erosion on both sides of fifth metatarsophalangeal joint indicating osteomyelitis. 2. 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 3. No significant change in dorsal subluxation of the base of second through fifth metatarsals. Electronically Signed   By: Neita Garnet M.D.   On: 12/28/2021 08:08   CT ANGIO CHEST AORTA W/CM &/OR WO/CM  Result Date: 12/28/2021 CLINICAL  DATA:  Leg claudication/ischemia. History of vascular disease. Clinical concern for aortic aneurysm. Ex-smoker. EXAM:  CT ANGIOGRAPHY CHEST WITH CONTRAST TECHNIQUE: Multidetector CT imaging of the chest was performed using the standard protocol during bolus administration of intravenous contrast. Multiplanar CT image reconstructions and MIPs were obtained to evaluate the vascular anatomy. RADIATION DOSE REDUCTION: This exam was performed according to the departmental dose-optimization program which includes automated exposure control, adjustment of the mA and/or kV according to patient size and/or use of iterative reconstruction technique. CONTRAST:  126mL OMNIPAQUE IOHEXOL 350 MG/ML SOLN COMPARISON:  Portable chest dated 02/04/2019. Chest, abdomen and pelvis CT dated 11/28/2018 FINDINGS: Cardiovascular: Mild atheromatous aortic calcifications. No aneurysm or dissection. Normal sized heart. Moderate plaque formation in the posterior aspect of the abdominal aorta at the level of the renal arteries, causing approximately 40% luminal stenosis. Mediastinum/Nodes: No enlarged mediastinal, hilar, or axillary lymph nodes. Thyroid gland, trachea, and esophagus demonstrate no significant findings. Lungs/Pleura: 4 mm left lower lobe anterior nodule abutting the major fissure, unchanged. This is compatible with a stable subpleural lymph node. No new nodules. Mild bilateral centrilobular bullous changes. Upper Abdomen: Diffuse low density of the liver. Musculoskeletal: Thoracic and lower cervical spine degenerative changes, including changes of DISH. Review of the MIP images confirms the above findings. IMPRESSION: 1. No aortic aneurysm or dissection. 2. Aortic atherosclerosis, as described above. 3. Mild changes of centrilobular emphysema. 4. Diffuse hepatic steatosis. Aortic Atherosclerosis (ICD10-I70.0) and Emphysema (ICD10-J43.9). Electronically Signed   By: Claudie Revering M.D.   On: 12/28/2021 14:57      Assessment/Plan:  Principal Problem:   Osteomyelitis [facility])  Antonio [name]. is a 66 year old male with uncontrolled hypertension, hyperlipidemia, diabetes, PAD, and history of left BKA (2019) presenting with right lower extremity pain associated with right foot ulcer. He is being admitted for medical and possible surgical management of osteomyelitis.   Osteomyelitis of the right 5th metatarsal Patient has a ongoing right foot ulcer that is been present for the past month.  X-ray of the right foot reveals cortical erosion on both sides of the fifth metatarsal phalangeal joint indicating osteomyelitis.  Patient underwent lower extremity angiogram results show severe global burden of calcific atherosclerosis.  Patient is at high risk for limb loss.  Vascular team was recommending iliofemoral endarterectomy and femoral AT bypass as patient's best option.  Blood cultures show no growth for 1 day. Pt will need wound culture to determine the disease causing pathogen. --Possible vascular procedure Monday --Check ABIs --Check vein mapping --Vascular following; recs appreciated --Resume DVT prophylaxis Lovenox --Continue Vanco and cefepime; add Flagyl for anaerobic coverage  Severe symptomatic hypertension Patient presented with elevated blood pressure, SBP greater than 200.  He responded well to IV labetalol in the ED.  He was then started on amlodipine and Coreg.  Initially blood pressure began to improve then on repeat check found to be 190/76. Following vascular studies, BP improved >160/80. If BP continues to spike consider adding oral hydralazine. --Coreg was increased from 6.25 -->12.5. --Continue amlodipine 10mg  and Coreg 12.5mg  BID  Uncontrolled Type 2 diabetes with complications Patient presented with type for hyperglycemia, glucose range in the 300s.  Patient is not adherent to home medications.  Current A1c 11.3%.  Patient was started on Semglee 10 units at bedtime.  Patient did  receive steroid doses in the ED which could result in higher than normal glucose readings.  Consider adjusting long-acting based on morning glucose readings. --Semglee 10 mg at bedtime increased to -->18units -- Novolog 0-5 units QHS and Novolog 3 units TID with meals for meal  coverage.     Prior to Admission Living Arrangement: Anticipated Discharge Location: Barriers to Discharge: Dispo: Anticipated discharge in approximately 1-2 day(s).   Timothy Lasso, MD 12/29/2021, 6:12 AM Pager: [phone] After 5pm on weekdays and 1pm on weekends: On Call pager [phone]

## 2021-12-29 NOTE — Progress Notes (Signed)
[facility] [place] PROGRESS NOTE  ASSESSMENT / PLAN: Antonio [name]. is a 66 y.o. male with right foot osteomyeltitis; PAD. Plan angiogram via left common femoral artery access today in cath lab. All questions answered.   SUBJECTIVE: No interval changes. Reviewed procedure plans.  OBJECTIVE: BP (!) 197/103    Pulse 95    Temp (!) 97.5 F (36.4 C) (Oral)    Resp (!) 85    Ht 6' (1.829 m)    Wt 93 kg    SpO2 (!) 0%    BMI 27.80 kg/m   Intake/Output Summary (Last 24 hours) at 12/29/2021 0833 Last data filed at 12/29/2021 0653 Gross per 24 hour  Intake 976.29 ml  Output 300 ml  Net 676.29 ml    Constitutional: chronically ill appearing. no acute distress. Cardiac: RRR. Pulmonary: unlabored Abdomen: soft Vascular: weak monophasic DP  CBC Latest Ref Rng & Units 12/29/2021 12/28/2021 12/28/2021  WBC 4.0 - 10.5 K/uL 6.8 - 6.9  Hemoglobin 13.0 - 17.0 g/dL 15.0 16.7 16.6  Hematocrit 39.0 - 52.0 % 43.3 49.0 48.8  Platelets 150 - 400 K/uL 216 - 222     CMP Latest Ref Rng & Units 12/29/2021 12/28/2021 12/28/2021  Glucose 70 - 99 mg/dL 210(H) 221(H) 225(H)  BUN 8 - 23 mg/dL 16 7(L) 7(L)  Creatinine 0.61 - 1.24 mg/dL 1.04 0.80 0.84  Sodium 135 - 145 mmol/L 137 138 136  Potassium 3.5 - 5.1 mmol/L 3.9 4.0 3.9  Chloride 98 - 111 mmol/L 102 97(L) 98  CO2 22 - 32 mmol/L 27 - 29  Calcium 8.9 - 10.3 mg/dL 8.2(L) - 8.8(L)  Total Protein 6.5 - 8.1 g/dL - - -  Total Bilirubin 0.3 - 1.2 mg/dL - - -  Alkaline Phos 38 - 126 U/L - - -  AST 15 - 41 U/L - - -  ALT 0 - 44 U/L - - -    Estimated Creatinine Clearance: 79.8 mL/min (by C-G formula based on SCr of 1.04 mg/dL).  Antonio [name]. Stanford Breed, MD [facility] Phone Number: [phone] 12/29/2021 8:33 AM

## 2021-12-29 NOTE — Progress Notes (Addendum)
Inpatient Diabetes Program Recommendations  AACE/ADA: New Consensus Statement on Inpatient Glycemic Control  Target Ranges:  Prepandial:   less than 140 mg/dL      Peak postprandial:   less than 180 mg/dL (1-2 hours)      Critically ill patients:  140 - 180 mg/dL    Latest Reference Range & Units 12/29/21 06:24 12/29/21 09:12  Glucose-Capillary 70 - 99 mg/dL 237 (H) 313 (H)    Latest Reference Range & Units 12/28/21 18:21 12/28/21 21:00  Glucose-Capillary 70 - 99 mg/dL 349 (H) 336 (H)    Latest Reference Range & Units 12/28/21 18:19  Hemoglobin A1C 4.8 - 5.6 % 11.3 (H)   Review of Glycemic Control  Diabetes history: DM2 Outpatient Diabetes medications: Levemir 15 units BID, Novolog 0-20 units TID with meals (per home med list, not taking Levemir or Novolog) Current orders for Inpatient glycemic control: Semglee 10 units QHS, Novolog 0-15 units TID with  meals  Inpatient Diabetes Program Recommendations:    Insulin: Please consider increasing Semglee to 18 units QHS, adding Novolog 0-5 units QHS, and when diet resumed ordering Novolog 3 units TID with meals for meal coverage if patient eats at least 50% of meals.  HbgA1C: A1C 11.3% on 12/28/21 indicating an average glucose of 278 mg/dl to evaluate glycemic control over the past 2-3 months.  Outpatient DM needs: At time of discharge, please provide Rx for: Lantus SoloStar [phone]), Novolog Flexpen [phone]), insulin pen needles [phone]), FreeStyle Libre2 Reader [phone]), FreeStyle Libre2 sensors [phone]).  NOTE: Spoke with patient regarding DM control. Patient states that he has not taken any DM medications in a while but notes he use to take 2 kind of insulins. Patient reports he is completely out of insulin and has none at home.  Patient states he has used both insulin pens and vial/syringe and prefers to use insulin pens. Patient states that he has not seen Heide Scales, NP in a while because "We had a sort of falling out. She is not  a doctor and she was sending me all over the place to see different doctors. I have a hard time getting around so I was not able to go to multiple doctor visits with different doctors. That is why I stopped taking everything and have not followed up." Patient states he would like to find a new provider to be his PCP in [place]. Will ask [facility] if they have a list of providers in that area taking new patients. Patient states he has not checked glucose in a long time and is interested in getting a sensor to use to check glucose. Discussed FreeStyle Libre2 CGM and how it works. Informed patient that I would ask attending provider if our team could provide him with some samples of FreeStyle Libre2 prior to discharge. Discussed A1C results (11.3% on 12/28/21) and explained that current A1C indicates an average glucose of 278 mg/dl over the past 2-3 months. Discussed glucose and A1C goals. Discussed importance of checking CBGs and maintaining good CBG control to prevent long-term and short-term complications. Stressed to the patient the importance of improving glycemic control to prevent further complications from uncontrolled diabetes especially given osteomyelitis. Patient states he is willing to take insulin outpatient if prescribed at discharge and he is given Rx for insulin pens at discharge. Encouraged patient to start taking DM medications consistently, checking glucose at least 3-4 times per day, establish care with new PCP and consistently follow up as directed.   Patient verbalized understanding of  information discussed and reports no further questions at this time related to diabetes. At time of discharge, please provide Rx for: Lantus SoloStar [phone]),  Novolog Flexpen [phone]), insulin pen needles [phone]), FreeStyle Libre2 Reader [phone]), FreeStyle Libre2 sensors [phone]).  Verbal order received from Dr. Timothy Lasso for inpatient diabetes team to provide pt with samples of FreeStyle Libre2 sensors  prior to discharge.  Thanks, Barnie Alderman, RN, MSN, CDE Diabetes Coordinator Inpatient Diabetes Program [phone] (Team Pager from 8am to 5pm)

## 2021-12-29 NOTE — Consult Note (Signed)
CONSULTATION NOTE   Patient Name: Fayez Billips. Date of Encounter: 12/29/2021 Cardiologist: Minus Breeding, MD Electrophysiologist: None Advanced Heart Failure: None   Chief Complaint   Preoperative risk assessment  Patient Profile   66 yo male patient with a history of coronary artery disease remotely with PCI in the 1990s and known PAD, presents for right lower extremity pain and osteomyelitis with planned PV evaluation.  HPI   Phynix Matey. is a 66 y.o. male who is being seen today for the evaluation of preoperative risk at the request of Dr. Virl Cagey.  This is a pleasant 66 year old male previously followed by Dr. Percival Spanish, last seen in 2019 with a history of coronary disease and stent in the 1990s.  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  Apparently is now presented with right lower limb pain and found to have osteomyelitis of the right fifth ray, now being considered for possible revascularization versus amputation.  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  Subsequent echo showed severely reduced LV function with EF 20 to 25% with what was suggested to be LAD territory infarct.  A right and left heart catheterization was recommended however never performed. Subsequently LVEF was normal by echo later that month.  It was thought that the reduced LVEF may have been related to stress/severe illness/infection.  He has not been seen in follow-up since that time.  PMHx   Past Medical History:  Diagnosis Date   Coronary artery disease    Diabetes mellitus without complication ([place])    Hyperlipidemia    Hypertension    Osteomyelitis ([facility]) 03/25/2017   RT FOOT    Past Surgical History:  Procedure Laterality Date   AMPUTATION Right 03/27/2017   Procedure: 1st Ray Amputation Right Foot;  Surgeon: Newt Minion, MD;  Location: [facility];  Service: Orthopedics;  Laterality: Right;   APPENDECTOMY      BELOW KNEE LEG AMPUTATION Left    CARDIAC CATHETERIZATION     CORONARY STENT INTERVENTION  2005   Great toe amputation right.     I & D EXTREMITY Left 12/30/2015   Procedure: IRRIGATION AND DEBRIDEMENT LEFT FOOT, TRANSMETATARSAL AMPUTATION WITH APPLICATION OF ANTIBIOTIC BEADS AND WOUND VAC;  Surgeon: Newt Minion, MD;  Location: [facility];  Service: Orthopedics;  Laterality: Left;   IR GASTROSTOMY TUBE MOD SED  12/23/2018   LOWER EXTREMITY ANGIOGRAPHY Right 12/29/2021   Procedure: LOWER EXTREMITY ANGIOGRAPHY;  Surgeon: Cherre Robins, MD;  Location: [facility];  Service: Cardiovascular;  Laterality: Right;   TONSILLECTOMY     TRACHEOSTOMY  11/2018    FAMHx   Family History  Problem Relation Age of Onset   Diabetes Mother    Emphysema Father    CAD Sister 20       Died of MI after flu    SOCHx    reports that he quit smoking about 3 years ago. His smoking use included cigarettes. He has a 70.00 pack-year smoking history. He has never used smokeless tobacco. He reports current alcohol use. He reports that he does not use drugs.  Outpatient Medications   No current facility-administered medications on file prior to encounter.   Current Outpatient Medications on File Prior to Encounter  Medication Sig Dispense Refill   acetaminophen (TYLENOL) 500 MG tablet Take 500 mg by mouth every 6 (six) hours as needed for moderate pain or  headache.     aspirin EC 81 MG tablet Take 81 mg by mouth daily.     atorvastatin (LIPITOR) 40 MG tablet Take 1 tablet (40 mg total) by mouth daily at 6 PM. (Patient not taking: Reported on 12/28/2021) 30 tablet 0   insulin aspart (NOVOLOG) 100 UNIT/ML injection Inject 0-20 Units into the skin 3 (three) times daily with meals. (Patient not taking: Reported on 12/28/2021) 10 mL 11   insulin detemir (LEVEMIR) 100 UNIT/ML injection Inject 0.15 mLs (15 Units total) into the skin 2 (two) times daily. (Patient not taking: Reported on 12/28/2021) 10 mL 11    oxyCODONE (OXY IR/ROXICODONE) 5 MG immediate release tablet Take 1 tablet (5 mg total) by mouth every 6 (six) hours as needed for severe pain. (Patient not taking: Reported on 12/28/2021) 10 tablet 0    Inpatient Medications    Scheduled Meds:  amLODipine  10 mg Oral Daily   atorvastatin  40 mg Oral q1800   carvedilol  12.5 mg Oral BID WC   [START ON 12/30/2021] enoxaparin (LOVENOX) injection  40 mg Subcutaneous Q24H   insulin aspart  0-15 Units Subcutaneous TID WC   insulin aspart  0-5 Units Subcutaneous QHS   insulin aspart  3 Units Subcutaneous TID WC   insulin glargine-yfgn  18 Units Subcutaneous QHS   polyethylene glycol  17 g Oral Daily   senna-docusate  2 tablet Oral QHS   sodium chloride flush  3 mL Intravenous Q12H    Continuous Infusions:  sodium chloride     sodium chloride     ceFEPime (MAXIPIME) IV Stopped (12/29/21 0549)   metronidazole Stopped (12/29/21 1037)   vancomycin Stopped (12/28/21 2220)    PRN Meds: sodium chloride, acetaminophen **OR** acetaminophen, albuterol, HYDROmorphone (DILAUDID) injection, ondansetron (ZOFRAN) IV, oxyCODONE, sodium chloride flush   ALLERGIES   Allergies  Allergen Reactions   Iodinated Contrast Media Hives, Rash and Other (See Comments)    Blisters Staph Blisters / staph   Penicillins Other (See Comments)    UNSPECIFIED REACTION  Has patient had a PCN reaction causing immediate rash, facial/tongue/throat swelling, SOB or lightheadedness with hypotension: No Has patient had a PCN reaction causing severe rash involving mucus membranes or skin necrosis: No Has patient had a PCN reaction that required hospitalization: No Has patient had a PCN reaction occurring within the last 10 years: No If all of the above answers are "NO", then may proceed with Cephalosporin use.    ROS   Pertinent items noted in HPI and remainder of comprehensive ROS otherwise negative.  Vitals   Vitals:   12/29/21 0940 12/29/21 0949 12/29/21 1005  12/29/21 1130  BP: (!) 157/60 (!) 157/60 (!) 157/60 (!) 142/71  Pulse: 63 68 60 88  Resp: 16  19 17   Temp:   97.7 F (36.5 C) 97.7 F (36.5 C)  TempSrc:   Oral Oral  SpO2: (!) 88%  90% 92%  Weight:      Height:        Intake/Output Summary (Last 24 hours) at 12/29/2021 1555 Last data filed at 12/29/2021 1318 Gross per 24 hour  Intake 1236.29 ml  Output 900 ml  Net 336.29 ml   Filed Weights   12/28/21 0924  Weight: 93 kg    Physical Exam   General appearance: alert and no distress Neck: no carotid bruit, no JVD, and thyroid not enlarged, symmetric, no tenderness/mass/nodules Lungs: clear to auscultation bilaterally Heart: regular rate and rhythm, S1, S2 normal, no murmur,  click, rub or gallop Abdomen: soft, non-tender; bowel sounds normal; no masses,  no organomegaly Extremities: L AKA, right great toe amputation Pulses: diminished distal right PT/DP pulses Skin: pale, cool, dry Neurologic: Grossly normal Psych: Pleasant  Labs   Results for orders placed or performed during the hospital encounter of 12/28/21 (from the past 48 hour(s))  Urinalysis, Routine w reflex microscopic     Status: Abnormal   Collection Time: 12/28/21  6:57 AM  Result Value Ref Range   Color, Urine YELLOW YELLOW   APPearance CLEAR CLEAR   Specific Gravity, Urine >1.030 (H) 1.005 - 1.030   pH 6.0 5.0 - 8.0   Glucose, UA >=500 (A) NEGATIVE mg/dL   Hgb urine dipstick LARGE (A) NEGATIVE   Bilirubin Urine NEGATIVE NEGATIVE   Ketones, ur NEGATIVE NEGATIVE mg/dL   Protein, ur >300 (A) NEGATIVE mg/dL   Nitrite NEGATIVE NEGATIVE   Leukocytes,Ua NEGATIVE NEGATIVE    Comment: Performed at [facility], [address], [place], [place] [address]  Urinalysis, Microscopic (reflex)     Status: Abnormal   Collection Time: 12/28/21  6:57 AM  Result Value Ref Range   RBC / HPF 11-20 0 - 5 RBC/hpf   WBC, UA 0-5 0 - 5 WBC/hpf   Bacteria, UA FEW (A) NONE SEEN   Squamous Epithelial / LPF 0-5 0 - 5    Mucus PRESENT    Granular Casts, UA PRESENT     Comment: Performed at [facility], [place] [address], [place], [place] [address]  CBC with Differential     Status: None   Collection Time: 12/28/21  8:15 AM  Result Value Ref Range   WBC 6.9 4.0 - 10.5 K/uL   RBC 5.68 4.22 - 5.81 MIL/uL   Hemoglobin 16.6 13.0 - 17.0 g/dL   HCT 48.8 39.0 - 52.0 %   MCV 85.9 80.0 - 100.0 fL   MCH 29.2 26.0 - 34.0 pg   MCHC 34.0 30.0 - 36.0 g/dL   RDW 12.4 11.5 - 15.5 %   Platelets 222 150 - 400 K/uL   nRBC 0.0 0.0 - 0.2 %   Neutrophils Relative % 62 %   Neutro Abs 4.4 1.7 - 7.7 K/uL   Lymphocytes Relative 28 %   Lymphs Abs 1.9 0.7 - 4.0 K/uL   Monocytes Relative 6 %   Monocytes Absolute 0.4 0.1 - 1.0 K/uL   Eosinophils Relative 2 %   Eosinophils Absolute 0.1 0.0 - 0.5 K/uL   Basophils Relative 1 %   Basophils Absolute 0.0 0.0 - 0.1 K/uL   Immature Granulocytes 1 %   Abs Immature Granulocytes 0.04 0.00 - 0.07 K/uL    Comment: Performed at [facility], [address], [place], [place] [id number]  Basic metabolic panel     Status: Abnormal   Collection Time: 12/28/21  8:15 AM  Result Value Ref Range   Sodium 136 135 - 145 mmol/L   Potassium 3.9 3.5 - 5.1 mmol/L   Chloride 98 98 - 111 mmol/L   CO2 29 22 - 32 mmol/L   Glucose, Bld 225 (H) 70 - 99 mg/dL    Comment: Glucose reference range applies only to samples taken after fasting for at least 8 hours.   BUN 7 (L) 8 - 23 mg/dL   Creatinine, Ser 0.84 0.61 - 1.24 mg/dL   Calcium 8.8 (L) 8.9 - 10.3 mg/dL   GFR, Estimated >60 >60 mL/min    Comment: (NOTE)  Calculated using the CKD-EPI Creatinine Equation (2021)    Anion gap 9 5 - 15    Comment: Performed at [facility], [place] [address] [place], [place] [address]  Resp Panel by RT-PCR (Flu A&B, Covid) Nasopharyngeal Swab     Status: None   Collection Time: 12/28/21  8:15 AM   Specimen: Nasopharyngeal Swab; Nasopharyngeal(NP) swabs in vial transport medium  Result Value Ref Range    SARS Coronavirus 2 by RT PCR NEGATIVE NEGATIVE    Comment: (NOTE) SARS-CoV-2 target nucleic acids are NOT DETECTED.  The SARS-CoV-2 RNA is generally detectable in upper respiratory specimens during the acute phase of infection. The lowest concentration of SARS-CoV-2 viral copies this assay can detect is 138 copies/mL. A negative result does not preclude SARS-Cov-2 infection and should not be used as the sole basis for treatment or other patient management decisions. A negative result may occur with  improper specimen collection/handling, submission of specimen other than nasopharyngeal swab, presence of viral mutation(s) within the areas targeted by this assay, and inadequate number of viral copies(<138 copies/mL). A negative result must be combined with clinical observations, patient history, and epidemiological information. The expected result is Negative.  Fact Sheet for Patients:  EntrepreneurPulse.com.au  Fact Sheet for Healthcare Providers:  IncredibleEmployment.be  This test is no t yet approved or cleared by the [place] FDA and  has been authorized for detection and/or diagnosis of SARS-CoV-2 by FDA under an Emergency Use Authorization (EUA). This EUA will remain  in effect (meaning this test can be used) for the duration of the COVID-19 declaration under Section 564(b)(1) of the Act, 21 U.S.C.section 360bbb-3(b)(1), unless the authorization is terminated  or revoked sooner.       Influenza A by PCR NEGATIVE NEGATIVE   Influenza B by PCR NEGATIVE NEGATIVE    Comment: (NOTE) The Xpert Xpress SARS-CoV-2/FLU/RSV plus assay is intended as an aid in the diagnosis of influenza from Nasopharyngeal swab specimens and should not be used as a sole basis for treatment. Nasal washings and aspirates are unacceptable for Xpert Xpress SARS-CoV-2/FLU/RSV testing.  Fact Sheet for Patients: EntrepreneurPulse.com.au  Fact  Sheet for Healthcare Providers: IncredibleEmployment.be  This test is not yet approved or cleared by the [place] FDA and has been authorized for detection and/or diagnosis of SARS-CoV-2 by FDA under an Emergency Use Authorization (EUA). This EUA will remain in effect (meaning this test can be used) for the duration of the COVID-19 declaration under Section 564(b)(1) of the Act, 21 U.S.C. section 360bbb-3(b)(1), unless the authorization is terminated or revoked.  Performed at [facility], [place] [address], [place], [place] [address]   Blood Cultures x 2 sites     Status: None (Preliminary result)   Collection Time: 12/28/21  8:21 AM   Specimen: BLOOD  Result Value Ref Range   Specimen Description BLOOD SITE NOT SPECIFIED    Special Requests      BOTTLES DRAWN AEROBIC AND ANAEROBIC Blood Culture adequate volume   Culture      NO GROWTH 1 DAY Performed at [facility], [place] [address], [place], [place] [address]    Report Status PENDING   I-stat chem 8, ED (not at [facility] or [facility])     Status: Abnormal   Collection Time: 12/28/21  8:23 AM  Result Value Ref Range   Sodium 138 135 - 145 mmol/L   Potassium 4.0 3.5 - 5.1 mmol/L   Chloride 97 (L) 98 - 111 mmol/L  BUN 7 (L) 8 - 23 mg/dL   Creatinine, Ser 0.80 0.61 - 1.24 mg/dL   Glucose, Bld 221 (H) 70 - 99 mg/dL    Comment: Glucose reference range applies only to samples taken after fasting for at least 8 hours.   Calcium, Ion 1.13 (L) 1.15 - 1.40 mmol/L   TCO2 34 (H) 22 - 32 mmol/L   Hemoglobin 16.7 13.0 - 17.0 g/dL   HCT 49.0 39.0 - 52.0 %  Lactic acid     Status: None   Collection Time: 12/28/21  9:40 AM  Result Value Ref Range   Lactic Acid, Venous 1.1 0.5 - 1.9 mmol/L    Comment: Performed at [facility] [address], [place], [place] [address]  Blood Cultures x 2 sites     Status: None (Preliminary result)   Collection Time: 12/28/21  9:40 AM   Specimen: BLOOD LEFT HAND  Result  Value Ref Range   Specimen Description BLOOD LEFT HAND    Special Requests      BOTTLES DRAWN AEROBIC ONLY Blood Culture results may not be optimal due to an inadequate volume of blood received in culture bottles   Culture      NO GROWTH 1 DAY Performed at [facility], [place] [address], [place], [place] [address]    Report Status PENDING   Lipid panel     Status: Abnormal   Collection Time: 12/28/21  1:49 PM  Result Value Ref Range   Cholesterol 206 (H) 0 - 200 mg/dL   Triglycerides 338 (H) <150 mg/dL   HDL 26 (L) >40 mg/dL   Total CHOL/HDL Ratio 7.9 RATIO   VLDL 68 (H) 0 - 40 mg/dL   LDL Cholesterol 112 (H) 0 - 99 mg/dL    Comment:        Total Cholesterol/HDL:CHD Risk Coronary Heart Disease Risk Table                     Men   Women  1/2 Average Risk   3.4   3.3  Average Risk       5.0   4.4  2 X Average Risk   9.6   7.1  3 X Average Risk  23.4   11.0        Use the calculated Patient Ratio above and the CHD Risk Table to determine the patient's CHD Risk.        ATP III CLASSIFICATION (LDL):  <100     mg/dL   Optimal  100-129  mg/dL   Near or Above                    Optimal  130-159  mg/dL   Borderline  160-189  mg/dL   High  >190     mg/dL   Very High Performed at [facility] [address] [place], [place] [address]   Hemoglobin A1c     Status: Abnormal   Collection Time: 12/28/21  6:19 PM  Result Value Ref Range   Hgb A1c MFr Bld 11.3 (H) 4.8 - 5.6 %    Comment: (NOTE) Pre diabetes:          5.7%-6.4%  Diabetes:              >6.4%  Glycemic control for   <7.0% adults with diabetes    Mean Plasma Glucose 277.61 mg/dL    Comment: Performed at [facility],  [address] [place], [place] [address]  Glucose, capillary     Status: Abnormal   Collection Time: 12/28/21  6:21 PM  Result Value Ref Range   Glucose-Capillary 349 (H) 70 - 99 mg/dL    Comment: Glucose reference range applies only to samples taken after fasting for at least 8  hours.  Glucose, capillary     Status: Abnormal   Collection Time: 12/28/21  9:00 PM  Result Value Ref Range   Glucose-Capillary 336 (H) 70 - 99 mg/dL    Comment: Glucose reference range applies only to samples taken after fasting for at least 8 hours.  Surgical pcr screen     Status: None   Collection Time: 12/29/21  5:15 AM   Specimen: Nasal Mucosa; Nasal Swab  Result Value Ref Range   MRSA, PCR NEGATIVE NEGATIVE   Staphylococcus aureus NEGATIVE NEGATIVE    Comment: (NOTE) The Xpert SA Assay (FDA approved for NASAL specimens in patients 32 years of age and older), is one component of a comprehensive surveillance program. It is not intended to diagnose infection nor to guide or monitor treatment. Performed at [facility], [place] [address], [place], [place] [address]   HIV Antibody (routine testing w rflx)     Status: None   Collection Time: 12/29/21  6:15 AM  Result Value Ref Range   HIV Screen 4th Generation wRfx Non Reactive Non Reactive    Comment: Performed at [facility], [place] [address] [place], [place] [id number]  Basic metabolic panel     Status: Abnormal   Collection Time: 12/29/21  6:15 AM  Result Value Ref Range   Sodium 137 135 - 145 mmol/L   Potassium 3.9 3.5 - 5.1 mmol/L   Chloride 102 98 - 111 mmol/L   CO2 27 22 - 32 mmol/L   Glucose, Bld 210 (H) 70 - 99 mg/dL    Comment: Glucose reference range applies only to samples taken after fasting for at least 8 hours.   BUN 16 8 - 23 mg/dL   Creatinine, Ser 1.04 0.61 - 1.24 mg/dL   Calcium 8.2 (L) 8.9 - 10.3 mg/dL   GFR, Estimated >60 >60 mL/min    Comment: (NOTE) Calculated using the CKD-EPI Creatinine Equation (2021)    Anion gap 8 5 - 15    Comment: Performed at [facility] [address], [place], [place] [address]  CBC     Status: None   Collection Time: 12/29/21  6:15 AM  Result Value Ref Range   WBC 6.8 4.0 - 10.5 K/uL   RBC 4.99 4.22 - 5.81 MIL/uL   Hemoglobin 15.0 13.0 - 17.0 g/dL    HCT 43.3 39.0 - 52.0 %   MCV 86.8 80.0 - 100.0 fL   MCH 30.1 26.0 - 34.0 pg   MCHC 34.6 30.0 - 36.0 g/dL   RDW 12.7 11.5 - 15.5 %   Platelets 216 150 - 400 K/uL   nRBC 0.0 0.0 - 0.2 %    Comment: Performed at [facility], [place] [address], [place], [place] [address]  Glucose, capillary     Status: Abnormal   Collection Time: 12/29/21  6:24 AM  Result Value Ref Range   Glucose-Capillary 237 (H) 70 - 99 mg/dL    Comment: Glucose reference range applies only to samples taken after fasting for at least 8 hours.  Glucose, capillary     Status: Abnormal   Collection Time: 12/29/21  9:12 AM  Result Value Ref Range   Glucose-Capillary 313 (H) 70 - 99 mg/dL    Comment: Glucose reference range applies only to samples taken after fasting for at least 8 hours.  Glucose, capillary     Status: Abnormal   Collection Time: 12/29/21 10:13 AM  Result Value Ref Range   Glucose-Capillary 322 (H) 70 - 99 mg/dL    Comment: Glucose reference range applies only to samples taken after fasting for at least 8 hours.    ECG   Sinus rhythm at 67 - Personally Reviewed  Telemetry   Sinus rhythm - Personally Reviewed  Radiology   CT Angio Aortobifemoral W and/or Wo Contrast  Result Date: 12/28/2021 CLINICAL DATA:  66 year old male with history of peripheral artery disease presenting with right fifth metatarsal osteomyelitis. EXAM: CT ANGIOGRAPHY OF ABDOMINAL AORTA WITH ILIOFEMORAL RUNOFF TECHNIQUE: Multidetector CT imaging of the abdomen, pelvis and lower extremities was performed using the standard protocol during bolus administration of intravenous contrast. Multiplanar CT image reconstructions and MIPs were obtained to evaluate the vascular anatomy. RADIATION DOSE REDUCTION: This exam was performed according to the departmental dose-optimization program which includes automated exposure control, adjustment of the mA and/or kV according to patient size and/or use of iterative reconstruction technique.  CONTRAST:  127mL OMNIPAQUE IOHEXOL 350 MG/ML SOLN COMPARISON:  None. FINDINGS: VASCULAR Aorta: The abdominal aorta is normal in caliber and patent throughout. There is significant infrarenal fibrofatty and calcific atherosclerotic disease resulting in approximately 50% focal luminal stenosis immediately inferior to the level of the renal arteries. Celiac: Patent without evidence of aneurysm, dissection, vasculitis or significant stenosis. SMA: Patent without evidence of aneurysm, dissection, vasculitis or significant stenosis. Renals: Dual bilateral renal arteries. The inferior right renal artery demonstrates focal high-grade ostial stenosis secondary to atherosclerotic plaque. The remaining renal arteries appear patent. IMA: Patent without evidence of aneurysm, dissection, vasculitis or significant stenosis. RIGHT Lower Extremity Inflow: Common, internal and external iliac arteries are patent. Near circumferential fibrofatty and calcific atherosclerotic changes with at least mild multifocal stenoses. Suggestion of severe focal stenosis at the level of the right inguinal ligament (series 4, image 131). Outflow: The common femoral artery is patent with circumferential atherosclerotic calcifications resulting in at least mild multifocal stenoses. The profunda is patent. The superficial femoral artery is patent throughout with near circumferential coarse atherosclerotic calcifications resulting in at least mild multifocal stenoses. The popliteal artery is patent throughout. Runoff: The anterior tibial artery is patent throughout. Focal short segment occlusion of the proximal tibioperoneal trunk with reconstitution via the peroneal artery which is patent to the level of the ankle. The posterior tibial artery appears occluded throughout. LEFT Lower Extremity Common iliac artery is patent. Severe ostial stenosis of the internal iliac artery secondary to atherosclerotic plaque. Coarse circumferential atherosclerotic plaque  throughout the external iliac arteries [place] at least moderate multifocal stenoses. The left common femoral artery is patent. The profundus is patent. Chronic appearing total occlusion of indwelling left superficial femoral artery stent. Review of the MIP images confirms the above findings. NON-VASCULAR Lower chest: No acute abnormality. Hepatobiliary: Mildly decreased attenuation of the hepatic parenchyma. Smooth contour normal size. No focal lesions. The gallbladder is present unremarkable. No intra or extrahepatic biliary ductal dilation. Pancreas: Unremarkable. No pancreatic ductal dilatation or surrounding inflammatory changes. Spleen: No splenic injury or perisplenic hematoma. Adrenals/Urinary Tract: Adrenal glands are unremarkable. Right lower pole anterior cortical simple renal cysts measuring approximately 13 mm in maximum axial diameter. Kidneys are otherwise normal, without renal calculi,  focal lesion, or hydronephrosis. Bladder is unremarkable. Stomach/Bowel: Stomach is within normal limits. Appendix is not definitively visualized. No evidence of bowel wall thickening, distention, or inflammatory changes. Lymphatic: No abdominopelvic lymphadenopathy. Reproductive: Prostate is unremarkable. Other: No abdominal wall hernia or abnormality. No abdominopelvic ascites. Musculoskeletal: Cortical destruction of the right fifth proximal middle, and distal phalanx compatible with known osteomyelitis. Diffuse right lower extremity edema, most prominent below the knee. No evidence of subcutaneous gas or discrete fluid collections. Status post left below the knee amputation. No evidence acute fracture or malalignment. IMPRESSION: VASCULAR 1. Advanced aortoiliac atherosclerotic disease resulting in approximately 50% focal stenosis of the infrarenal abdominal aorta. 2. Severe focal stenosis of the distal right external iliac artery at the level of the inguinal ligament. Patent right lower extremity runoff vessels with  near circumferential atherosclerotic calcifications. Proximal right tibioperoneal trunk occlusion with two vessel runoff to the level of the ankle via the anterior tibial and peroneal arteries. 3. Chronic occlusion of the indwelling left superficial femoral artery stent. NON-VASCULAR 1. Cortical destruction of the right fifth phalanges in keeping with history of osteomyelitis. Diffuse right lower extremity edema, was prominent below the knee. 2. Mild hepatic steatosis. Ruthann Cancer, MD [facility] Rad[facility] Electronically Signed   By: Ruthann Cancer M.D.   On: 12/28/2021 16:16   PERIPHERAL VASCULAR CATHETERIZATION  Result Date: 12/29/2021 DATE OF SERVICE: 12/29/2021  PATIENT:  [name].  66 y.o. male  PRE-OPERATIVE DIAGNOSIS:  Atherosclerosis of native arteries of right lower extremity causing diabetic foot infection; osteomyelitis  POST-OPERATIVE DIAGNOSIS:  Same  PROCEDURE:  1) US guided left common femoral artery access 2) Aortogram 3) Right lower extremity angiogram with second order cannulation (121mL total contrast) 4) Conscious sedation (33 minutes)  SURGEON:  Yevonne Aline. Stanford Breed, MD  ASSISTANT: none  ANESTHESIA:   none  ESTIMATED BLOOD LOSS: minimal  LOCAL MEDICATIONS USED:  LIDOCAINE  COUNTS: confirmed correct.  PATIENT DISPOSITION:  PACU - hemodynamically stable.  Delay start of Pharmacological VTE agent (>24hrs) due to surgical blood loss or risk of bleeding: no  INDICATION FOR PROCEDURE: Chukwuma Tornes. is a 66 y.o. male with osteomyelitis of the right foot with imaging evidence of severe PAD. After careful discussion of risks, benefits, and alternatives the patient was offered angiography with possible intervention. The patient understood and wished to proceed.  OPERATIVE FINDINGS: Terminal aorta and iliac arteries: Diffuse disease, heavy calcification without significant stenosis  Right lower extremity: Common femoral artery: Heavily  diseased with 2 areas of significant stenosis (greater than 75%). Profunda femoris artery: Heavily diseased.  Possible ostial stenosis limiting flow Superficial femoral artery: Heavily diseased.  Diffuse stenosis, but not greater than 50%.  1 area at Hunter's canal about 60 to 70% stenosis. Popliteal artery: Patent without significant stenosis in the above and behind knee segment.  1 area of significant stenosis above the anterior tibial artery (about 60%) Anterior tibial artery: Dominant tibial vessel.  This courses to the foot.  Generous tibial artery. Tibioperoneal trunk: Occluded Peroneal artery: Occluded until the mid calf where it reconstitutes. Posterior tibial artery: Occluded Pedal circulation: Disadvantaged.  Fills via the anterior tibial circulation.  GLASS score. Grade 3: expect 53% 1 year re-intervention rate; expect 69% 5 year re-intervention rate; expect 49% 5 year mortality; expect 61% rate of restenosis from open operation at 5 years; expect 83% rate of restenosis from endovascular procedure..  DESCRIPTION OF PROCEDURE: After identification of the patient in the pre-operative holding  area, the patient was transferred to the operating room. The patient was positioned supine on the operating room table. Anesthesia was induced. The groins was prepped and draped in standard fashion. A surgical pause was performed confirming correct patient, procedure, and operative location.  The left groin was anesthetized with subcutaneous injection of 1% lidocaine. Using ultrasound guidance, the left common femoral artery was accessed with micropuncture technique. Fluoroscopy was used to confirm cannulation over the femoral head. The 82F sheath was upsized to 31F.  A Benson wire was advanced into the distal aorta. Over the wire an omni flush catheter was advanced to the level of L2. Aortogram was performed - see above for details.  The right common iliac artery was selected with a Benson guidewire. The wire was  advanced into the common femoral artery. Over the wire the omni flush catheter was advanced into the external iliac artery. Selective right lower extremity angiography was performed - see above for details.  The sheath was left in place to be removed in the recovery area.  Conscious sedation was administered with the use of IV fentanyl and midazolam under continuous physician and nurse monitoring.  Heart rate, blood pressure, and oxygen saturation were continuously monitored.  Total sedation time was 33 minutes  Upon completion of the case instrument and sharps counts were confirmed correct. The patient was transferred to the PACU in good condition. I was present for all portions of the procedure.  PLAN: Severe, global burden of calcific atherosclerosis.  Distal external iliac and common femoral artery disease not amenable to stenting.  Diffuse femoral-popliteal disease with hemodynamically significant stenosis.  Anterior tibial artery and attractive target, but pedal outflow disadvantaged.  He is at high risk for limb loss.  Discussed the case with Dr. Sherral Hammers.  I think a iliofemoral endarterectomy and femoral AT bypass is his best option we will plan to do this on Monday.  Check ABI.  Check vein mapping.  We will ask cardiology service to risk stratify for open vascular surgery.  Rande Brunt. Lenell Antu, MD [facility] Phone Number: [phone] 12/29/2021 8:34 AM    DG Foot Complete Right  Result Date: 12/28/2021 CLINICAL DATA:  Lateral foot ulceration. Ulceration is all over right foot. 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 Is irregularity of the soft tissues lateral to the fifth metatarsal head. In this region there is moderate cortical destruction of the base of the proximal phalanx and the distal aspect of the metatarsal  head of the fifth ray. There is resultant mild medial subluxation of fifth toe proximal phalanx. There is again apparent attenuation of the distal aspect of the cuneiforms and cuboid and dorsal subluxation of the second through fifth metatarsals (and possibly the remaining base of the first metatarsal). IMPRESSION:: IMPRESSION: 1. Soft tissue wound lateral to the fifth metatarsal head with new cortical erosion on both sides of fifth metatarsophalangeal joint indicating osteomyelitis. 2. 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 3. No significant change in dorsal subluxation of the base of second through fifth metatarsals. Electronically Signed   By: Neita Garnet M.D.   On: 12/28/2021 08:08   CT ANGIO CHEST AORTA W/CM &/OR WO/CM  Result Date: 12/28/2021 CLINICAL DATA:  Leg claudication/ischemia. History of vascular disease. Clinical concern for aortic aneurysm. Ex-smoker.  EXAM: CT ANGIOGRAPHY CHEST WITH CONTRAST TECHNIQUE: Multidetector CT imaging of the chest was performed using the standard protocol during bolus administration of intravenous contrast. Multiplanar CT image reconstructions and MIPs were obtained to evaluate the vascular anatomy. RADIATION DOSE REDUCTION: This exam was performed according to the departmental dose-optimization program which includes automated exposure control, adjustment of the mA and/or kV according to patient size and/or use of iterative reconstruction technique. CONTRAST:  158mL OMNIPAQUE IOHEXOL 350 MG/ML SOLN COMPARISON:  Portable chest dated 02/04/2019. Chest, abdomen and pelvis CT dated 11/28/2018 FINDINGS: Cardiovascular: Mild atheromatous aortic calcifications. No aneurysm or dissection. Normal sized heart. Moderate plaque formation in the posterior aspect of the abdominal aorta at the level of the renal arteries, causing approximately 40% luminal stenosis. Mediastinum/Nodes: No enlarged mediastinal, hilar, or axillary lymph  nodes. Thyroid gland, trachea, and esophagus demonstrate no significant findings. Lungs/Pleura: 4 mm left lower lobe anterior nodule abutting the major fissure, unchanged. This is compatible with a stable subpleural lymph node. No new nodules. Mild bilateral centrilobular bullous changes. Upper Abdomen: Diffuse low density of the liver. Musculoskeletal: Thoracic and lower cervical spine degenerative changes, including changes of DISH. Review of the MIP images confirms the above findings. IMPRESSION: 1. No aortic aneurysm or dissection. 2. Aortic atherosclerosis, as described above. 3. Mild changes of centrilobular emphysema. 4. Diffuse hepatic steatosis. Aortic Atherosclerosis (ICD10-I70.0) and Emphysema (ICD10-J43.9). Electronically Signed   By: Claudie Revering M.D.   On: 12/28/2021 14:57    Cardiac Studies   N/A  Impression   Principal Problem:   Osteomyelitis ([place]) Active Problems:   Type 2 diabetes mellitus with hyperglycemia, with long-term current use of insulin (HCC)   Hypertension   PVD (peripheral vascular disease) ([place])   Recommendation   Mr. Micke has a history of CAD with remote stent. He had a sepsis related cardiomyopathy in 11/2018, which resolved. He has not been seen by cardiology since then. Would recommend a repeat echo to ensure stable LV function and if the LVEF is reasonably preserved, could proceed with angiography/surgery next week without ischemia evaluation. He is able to ambulate some with a prosthesis and denies angina. He had a negative myoview in 2019. BP elevated here, but now started on amlodipine and coreg.  Thanks for the consultation. Cardiology will follow-up on his echo tomorrow.  Time Spent Directly with Patient:  I have spent a total of 45 minutes with the patient reviewing hospital notes, telemetry, EKGs, labs and examining the patient as well as establishing an assessment and plan that was discussed personally with the patient.  > 50% of time was spent in  direct patient care.  Length of Stay:  LOS: 1 day   Pixie Casino, MD, [facility], [place] Director of the Advanced Lipid Disorders &  Cardiovascular Risk Reduction Clinic Diplomate of the American Board of Clinical Lipidology Attending Cardiologist  Direct Dial: [phone]   Fax: [phone]  Website:  [email or web address] 12/29/2021, 3:55 PM

## 2021-12-29 NOTE — Progress Notes (Signed)
ABI and bilateral lower extremity vein mapping completed. Refer to "CV Proc" under chart review to view preliminary results.  12/29/2021 4:11 PM Eula Fried., MHA, RVT, RDCS, RDMS

## 2021-12-29 NOTE — Op Note (Signed)
DATE OF SERVICE: 12/29/2021  PATIENT:  Antonio [name].  66 y.o. male  PRE-OPERATIVE DIAGNOSIS:  Atherosclerosis of native arteries of right lower extremity causing diabetic foot infection; osteomyelitis  POST-OPERATIVE DIAGNOSIS:  Same  PROCEDURE:   1) US guided left common femoral artery access 2) Aortogram 3) Right lower extremity angiogram with second order cannulation (133mL total contrast) 4) Conscious sedation (33 minutes)  SURGEON:  Yevonne Aline. Stanford Breed, MD  ASSISTANT: none  ANESTHESIA:   none  ESTIMATED BLOOD LOSS: minimal  LOCAL MEDICATIONS USED:  LIDOCAINE   COUNTS: confirmed correct.  PATIENT DISPOSITION:  PACU - hemodynamically stable.   Delay start of Pharmacological VTE agent (>24hrs) due to surgical blood loss or risk of bleeding: no  INDICATION FOR PROCEDURE: Antonio [name]. is a 66 y.o. male with osteomyelitis of the right foot with imaging evidence of severe PAD. After careful discussion of risks, benefits, and alternatives the patient was offered angiography with possible intervention. The patient understood and wished to proceed.  OPERATIVE FINDINGS:  Terminal aorta and iliac arteries: Diffuse disease, heavy calcification without significant stenosis  Right lower extremity: Common femoral artery: Heavily diseased with 2 areas of significant stenosis (greater than 75%). Profunda femoris artery: Heavily diseased.  Possible ostial stenosis limiting flow Superficial femoral artery: Heavily diseased.  Diffuse stenosis, but not greater than 50%.  1 area at Hunter's canal about 60 to 70% stenosis. Popliteal artery: Patent without significant stenosis in the above and behind knee segment.  1 area of significant stenosis above the anterior tibial artery (about 60%) Anterior tibial artery: Dominant tibial vessel.  This courses to the foot.  Generous tibial artery. Tibioperoneal trunk: Occluded Peroneal artery: Occluded until the mid calf where it  reconstitutes. Posterior tibial artery: Occluded Pedal circulation: Disadvantaged.  Fills via the anterior tibial circulation.  GLASS score. Grade 3: expect 53% 1 year re-intervention rate; expect 69% 5 year re-intervention rate; expect 49% 5 year mortality; expect 61% rate of restenosis from open operation at 5 years; expect 83% rate of restenosis from endovascular procedure..   DESCRIPTION OF PROCEDURE: After identification of the patient in the pre-operative holding area, the patient was transferred to the operating room. The patient was positioned supine on the operating room table. Anesthesia was induced. The groins was prepped and draped in standard fashion. A surgical pause was performed confirming correct patient, procedure, and operative location.  The left groin was anesthetized with subcutaneous injection of 1% lidocaine. Using ultrasound guidance, the left common femoral artery was accessed with micropuncture technique. Fluoroscopy was used to confirm cannulation over the femoral head. The 2F sheath was upsized to 37F.   A Benson wire was advanced into the distal aorta. Over the wire an omni flush catheter was advanced to the level of L2. Aortogram was performed - see above for details.   The right common iliac artery was selected with a Benson guidewire. The wire was advanced into the common femoral artery. Over the wire the omni flush catheter was advanced into the external iliac artery. Selective right lower extremity angiography was performed - see above for details.   The sheath was left in place to be removed in the recovery area.  Conscious sedation was administered with the use of IV fentanyl and midazolam under continuous physician and nurse monitoring.  Heart rate, blood pressure, and oxygen saturation were continuously monitored.  Total sedation time was 33 minutes  Upon completion of the case instrument and sharps counts were confirmed correct. The  patient was transferred to  the PACU in good condition. I was present for all portions of the procedure.  PLAN: Severe, global burden of calcific atherosclerosis.  Distal external iliac and common femoral artery disease not amenable to stenting.  Diffuse femoral-popliteal disease with hemodynamically significant stenosis.  Anterior tibial artery and attractive target, but pedal outflow disadvantaged.  He is at high risk for limb loss.  Discussed the case with Dr. Unk Lightning.  I think a iliofemoral endarterectomy and femoral AT bypass is his best option we will plan to do this on Monday.  Check ABI.  Check vein mapping.  We will ask cardiology service to risk stratify for open vascular surgery.  Yevonne Aline. Stanford Breed, MD [facility] Phone Number: [phone] 12/29/2021 8:34 AM

## 2021-12-29 NOTE — Progress Notes (Signed)
Ultimately patient decided he was not going to bleed and sat himself up to eat stating that doctor was aware and was ok with him up eating. Pt groin remains soft and without evidence of hematoma. Pt resting with call bell within reach.  Will continue to monitor. Payton Emerald, RN

## 2021-12-29 NOTE — Progress Notes (Signed)
Pt arrived from cath lab. VSS, telemetry applied and CCMD  called with two verifiers, CHG given, and patient educated concerning bedrest and restrictions. Pt reminded several times about keeping leg straight and no straining. Thomas Hoff, RN

## 2021-12-30 DIAGNOSIS — M869 Osteomyelitis, unspecified: Secondary | ICD-10-CM | POA: Diagnosis not present

## 2021-12-30 DIAGNOSIS — I739 Peripheral vascular disease, unspecified: Secondary | ICD-10-CM

## 2021-12-30 DIAGNOSIS — Z01810 Encounter for preprocedural cardiovascular examination: Secondary | ICD-10-CM

## 2021-12-30 LAB — BASIC METABOLIC PANEL
Anion gap: 9 (ref 5–15)
BUN: 28 mg/dL — ABNORMAL HIGH (ref 8–23)
CO2: 25 mmol/L (ref 22–32)
Calcium: 8.1 mg/dL — ABNORMAL LOW (ref 8.9–10.3)
Chloride: 101 mmol/L (ref 98–111)
Creatinine, Ser: 1.1 mg/dL (ref 0.61–1.24)
GFR, Estimated: >60 mL/min (ref >60)
Glucose, Bld: 242 mg/dL — ABNORMAL HIGH (ref 70–99)
Potassium: 5.3 mmol/L — ABNORMAL HIGH (ref 3.5–5.1)
Sodium: 135 mmol/L (ref 135–145)

## 2021-12-30 LAB — CBC WITH DIFFERENTIAL/PLATELET
Abs Immature Granulocytes: 0.06 10*3/uL (ref 0.00–0.07)
Basophils Absolute: 0 10*3/uL (ref 0.0–0.1)
Basophils Relative: 0 %
Eosinophils Absolute: 0 10*3/uL (ref 0.0–0.5)
Eosinophils Relative: 0 %
HCT: 42.7 % (ref 39.0–52.0)
Hemoglobin: 14.4 g/dL (ref 13.0–17.0)
Immature Granulocytes: 1 %
Lymphocytes Relative: 11 %
Lymphs Abs: 0.8 10*3/uL (ref 0.7–4.0)
MCH: 29.6 pg (ref 26.0–34.0)
MCHC: 33.7 g/dL (ref 30.0–36.0)
MCV: 87.9 fL (ref 80.0–100.0)
Monocytes Absolute: 0.4 10*3/uL (ref 0.1–1.0)
Monocytes Relative: 6 %
Neutro Abs: 6.1 10*3/uL (ref 1.7–7.7)
Neutrophils Relative %: 82 %
Platelets: 219 10*3/uL (ref 150–400)
RBC: 4.86 MIL/uL (ref 4.22–5.81)
RDW: 12.5 % (ref 11.5–15.5)
WBC: 7.4 10*3/uL (ref 4.0–10.5)
nRBC: 0 % (ref 0.0–0.2)

## 2021-12-30 LAB — GLUCOSE, CAPILLARY
Glucose-Capillary: 264 mg/dL — ABNORMAL HIGH (ref 70–99)
Glucose-Capillary: 155 mg/dL — ABNORMAL HIGH (ref 70–99)
Glucose-Capillary: 140 mg/dL — ABNORMAL HIGH (ref 70–99)
Glucose-Capillary: 185 mg/dL — ABNORMAL HIGH (ref 70–99)

## 2021-12-30 LAB — C-REACTIVE PROTEIN: CRP: 1.6 mg/dL — ABNORMAL HIGH (ref <1.0)

## 2021-12-30 LAB — SEDIMENTATION RATE: Sed Rate: 32 mm/hr — ABNORMAL HIGH (ref 0–16)

## 2021-12-30 LAB — POTASSIUM: Potassium: 4.2 mmol/L (ref 3.5–5.1)

## 2021-12-30 MED ORDER — VANCOMYCIN HCL IN DEXTROSE 1-5 GM/200ML-% IV SOLN
1000.0000 mg | Freq: Two times a day (BID) | INTRAVENOUS | Status: DC
  Administered 2021-12-30 – 2022-01-02 (×5): 1000 mg via INTRAVENOUS
  Filled 2021-12-30 (×6): qty 200

## 2021-12-30 MED ORDER — INSULIN ASPART 100 UNIT/ML IJ SOLN
0.0000 [IU] | Freq: Three times a day (TID) | INTRAMUSCULAR | Status: DC
  Administered 2021-12-30: 4 [IU] via SUBCUTANEOUS
  Administered 2021-12-30 (×2): 3 [IU] via SUBCUTANEOUS
  Administered 2021-12-31 – 2022-01-01 (×4): 4 [IU] via SUBCUTANEOUS
  Administered 2022-01-01: 3 [IU] via SUBCUTANEOUS
  Administered 2022-01-02: 4 [IU] via SUBCUTANEOUS
  Administered 2022-01-02: 7 [IU] via SUBCUTANEOUS
  Administered 2022-01-03: 3 [IU] via SUBCUTANEOUS
  Administered 2022-01-04 (×2): 4 [IU] via SUBCUTANEOUS
  Administered 2022-01-04: 7 [IU] via SUBCUTANEOUS
  Administered 2022-01-05 – 2022-01-07 (×3): 4 [IU] via SUBCUTANEOUS
  Administered 2022-01-07: 3 [IU] via SUBCUTANEOUS
  Administered 2022-01-07 – 2022-01-08 (×2): 4 [IU] via SUBCUTANEOUS
  Administered 2022-01-08: 3 [IU] via SUBCUTANEOUS
  Administered 2022-01-09: 4 [IU] via SUBCUTANEOUS

## 2021-12-30 MED ORDER — MICONAZOLE NITRATE POWD: Freq: Two times a day (BID) | Status: DC

## 2021-12-30 MED ORDER — NYSTATIN 100000 UNIT/GM EX POWD
Freq: Two times a day (BID) | CUTANEOUS | Status: DC
  Administered 2021-12-30 – 2021-12-31 (×2): 1 via TOPICAL
  Administered 2022-01-07: 1 [IU] via TOPICAL
  Filled 2021-12-30 (×2): qty 15

## 2021-12-30 MED ORDER — INSULIN GLARGINE-YFGN 100 UNIT/ML ~~LOC~~ SOLN
22.0000 [IU] | Freq: Every day | SUBCUTANEOUS | Status: DC
  Administered 2021-12-30: 22 [IU] via SUBCUTANEOUS
  Filled 2021-12-30 (×2): qty 0.22

## 2021-12-30 NOTE — Consult Note (Signed)
ORTHOPAEDIC CONSULTATION  REQUESTING PHYSICIAN: Mercie Eon, MD  Chief Complaint: Necrotic ulcer fifth metatarsal head right foot.  HPI: Antonio [name]. is a 66 y.o. 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  Patient is status post a transmetatarsal amputation on the left in 2017 and a left transtibial amputation in 2019 at [facility].  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  The ulcer is dorsal.  Past Medical History:  Diagnosis Date   Coronary artery disease    Diabetes mellitus without complication (HCC)    Hyperlipidemia    Hypertension    Osteomyelitis (HCC) 03/25/2017   RT FOOT   Past Surgical History:  Procedure Laterality Date   AMPUTATION Right 03/27/2017   Procedure: 1st Ray Amputation Right Foot;  Surgeon: Nadara Mustard, MD;  Location: [facility] OR;  Service: Orthopedics;  Laterality: Right;   APPENDECTOMY     BELOW KNEE LEG AMPUTATION Left    CARDIAC CATHETERIZATION     CORONARY STENT INTERVENTION  2005   Great toe amputation right.     I & D EXTREMITY Left 12/30/2015   Procedure: IRRIGATION AND DEBRIDEMENT LEFT FOOT, TRANSMETATARSAL AMPUTATION WITH APPLICATION OF ANTIBIOTIC BEADS AND WOUND VAC;  Surgeon: Nadara Mustard, MD;  Location: [facility] OR;  Service: Orthopedics;  Laterality: Left;   IR GASTROSTOMY TUBE MOD SED  12/23/2018   LOWER EXTREMITY ANGIOGRAPHY Right 12/29/2021   Procedure: LOWER EXTREMITY ANGIOGRAPHY;  Surgeon: Leonie Douglas, MD;  Location: [facility] INVASIVE CV LAB;  Service: Cardiovascular;  Laterality: Right;   TONSILLECTOMY     TRACHEOSTOMY  11/2018   Social History   Socioeconomic History   Marital status: Divorced    Spouse name: Not on file   Number of children: Not on file   Years of education: Not on file   Highest education level: Not on file  Occupational History    Not on file  Tobacco Use   Smoking status: Former    Packs/day: 2.00    Years: 35.00    Pack years: 70.00    Types: Cigarettes    Quit date: 11/28/2018    Years since quitting: 3.0   Smokeless tobacco: Never  Vaping Use   Vaping Use: Former  Substance and Sexual Activity   Alcohol use: Yes    Comment: beers occasionally   Drug use: No   Sexual activity: Not on file  Other Topics Concern   Not on file  Social History Narrative   Lives with mother.  He has three children.     Social Determinants of Health   Financial Resource Strain: Not on file  Food Insecurity: Not on file  Transportation Needs: Not on file  Physical Activity: Not on file  Stress: Not on file  Social Connections: Not on file   Family History  Problem Relation Age of Onset   Diabetes Mother    Emphysema Father    CAD Sister 80       Died of MI after flu   - negative except otherwise stated in the family history section Allergies  Allergen Reactions   Iodinated Contrast Media Hives, Rash and Other (See Comments)    Blisters Staph Blisters / staph   Penicillins Other (See Comments)    UNSPECIFIED REACTION  Has patient had a PCN reaction causing immediate  rash, facial/tongue/throat swelling, SOB or lightheadedness with hypotension: No Has patient had a PCN reaction causing severe rash involving mucus membranes or skin necrosis: No Has patient had a PCN reaction that required hospitalization: No Has patient had a PCN reaction occurring within the last 10 years: No If all of the above answers are "NO", then may proceed with Cephalosporin use.   Prior to Admission medications   Medication Sig Start Date End Date Taking? Authorizing Provider  acetaminophen (TYLENOL) 500 MG tablet Take 500 mg by mouth every 6 (six) hours as needed for moderate pain or headache.   Yes [provider]  aspirin EC 81 MG tablet Take 81 mg by mouth daily.   Yes [provider]  atorvastatin (LIPITOR) 40 MG  tablet Take 1 tablet (40 mg total) by mouth daily at 6 PM. Patient not taking: Reported on 12/28/2021 03/28/17   Everrett Coombe, MD  insulin aspart (NOVOLOG) 100 UNIT/ML injection Inject 0-20 Units into the skin 3 (three) times daily with meals. Patient not taking: Reported on 12/28/2021 02/20/19   Barb Merino, MD  insulin detemir (LEVEMIR) 100 UNIT/ML injection Inject 0.15 mLs (15 Units total) into the skin 2 (two) times daily. Patient not taking: Reported on 12/28/2021 02/20/19   Barb Merino, MD  oxyCODONE (OXY IR/ROXICODONE) 5 MG immediate release tablet Take 1 tablet (5 mg total) by mouth every 6 (six) hours as needed for severe pain. Patient not taking: Reported on 12/28/2021 02/20/19   Elgergawy, Silver Huguenin, MD   CT Angio Aortobifemoral W and/or Wo Contrast  Result Date: 12/28/2021 CLINICAL DATA:  66 year old male with history of peripheral artery disease presenting with right fifth metatarsal osteomyelitis. EXAM: CT ANGIOGRAPHY OF ABDOMINAL AORTA WITH ILIOFEMORAL RUNOFF TECHNIQUE: Multidetector CT imaging of the abdomen, pelvis and lower extremities was performed using the standard protocol during bolus administration of intravenous contrast. Multiplanar CT image reconstructions and MIPs were obtained to evaluate the vascular anatomy. RADIATION DOSE REDUCTION: This exam was performed according to the departmental dose-optimization program which includes automated exposure control, adjustment of the mA and/or kV according to patient size and/or use of iterative reconstruction technique. CONTRAST:  162mL OMNIPAQUE IOHEXOL 350 MG/ML SOLN COMPARISON:  None. FINDINGS: VASCULAR Aorta: The abdominal aorta is normal in caliber and patent throughout. There is significant infrarenal fibrofatty and calcific atherosclerotic disease resulting in approximately 50% focal luminal stenosis immediately inferior to the level of the renal arteries. Celiac: Patent without evidence of aneurysm, dissection, vasculitis or significant  stenosis. SMA: Patent without evidence of aneurysm, dissection, vasculitis or significant stenosis. Renals: Dual bilateral renal arteries. The inferior right renal artery demonstrates focal high-grade ostial stenosis secondary to atherosclerotic plaque. The remaining renal arteries appear patent. IMA: Patent without evidence of aneurysm, dissection, vasculitis or significant stenosis. RIGHT Lower Extremity Inflow: Common, internal and external iliac arteries are patent. Near circumferential fibrofatty and calcific atherosclerotic changes with at least mild multifocal stenoses. Suggestion of severe focal stenosis at the level of the right inguinal ligament (series 4, image 131). Outflow: The common femoral artery is patent with circumferential atherosclerotic calcifications resulting in at least mild multifocal stenoses. The profunda is patent. The superficial femoral artery is patent throughout with near circumferential coarse atherosclerotic calcifications resulting in at least mild multifocal stenoses. The popliteal artery is patent throughout. Runoff: The anterior tibial artery is patent throughout. Focal short segment occlusion of the proximal tibioperoneal trunk with reconstitution via the peroneal artery which is patent to the level of the ankle. The posterior tibial  artery appears occluded throughout. LEFT Lower Extremity Common iliac artery is patent. Severe ostial stenosis of the internal iliac artery secondary to atherosclerotic plaque. Coarse circumferential atherosclerotic plaque throughout the external iliac arteries [place] at least moderate multifocal stenoses. The left common femoral artery is patent. The profundus is patent. Chronic appearing total occlusion of indwelling left superficial femoral artery stent. Review of the MIP images confirms the above findings. NON-VASCULAR Lower chest: No acute abnormality. Hepatobiliary: Mildly decreased attenuation of the hepatic parenchyma. Smooth contour normal  size. No focal lesions. The gallbladder is present unremarkable. No intra or extrahepatic biliary ductal dilation. Pancreas: Unremarkable. No pancreatic ductal dilatation or surrounding inflammatory changes. Spleen: No splenic injury or perisplenic hematoma. Adrenals/Urinary Tract: Adrenal glands are unremarkable. Right lower pole anterior cortical simple renal cysts measuring approximately 13 mm in maximum axial diameter. Kidneys are otherwise normal, without renal calculi, focal lesion, or hydronephrosis. Bladder is unremarkable. Stomach/Bowel: Stomach is within normal limits. Appendix is not definitively visualized. No evidence of bowel wall thickening, distention, or inflammatory changes. Lymphatic: No abdominopelvic lymphadenopathy. Reproductive: Prostate is unremarkable. Other: No abdominal wall hernia or abnormality. No abdominopelvic ascites. Musculoskeletal: Cortical destruction of the right fifth proximal middle, and distal phalanx compatible with known osteomyelitis. Diffuse right lower extremity edema, most prominent below the knee. No evidence of subcutaneous gas or discrete fluid collections. Status post left below the knee amputation. No evidence acute fracture or malalignment. IMPRESSION: VASCULAR 1. Advanced aortoiliac atherosclerotic disease resulting in approximately 50% focal stenosis of the infrarenal abdominal aorta. 2. Severe focal stenosis of the distal right external iliac artery at the level of the inguinal ligament. Patent right lower extremity runoff vessels with near circumferential atherosclerotic calcifications. Proximal right tibioperoneal trunk occlusion with two vessel runoff to the level of the ankle via the anterior tibial and peroneal arteries. 3. Chronic occlusion of the indwelling left superficial femoral artery stent. NON-VASCULAR 1. Cortical destruction of the right fifth phalanges in keeping with history of osteomyelitis. Diffuse right lower extremity edema, was prominent  below the knee. 2. Mild hepatic steatosis. Ruthann Cancer, MD [facility] Radiology Electronically Signed   By: Ruthann Cancer M.D.   On: 12/28/2021 16:16   PERIPHERAL VASCULAR CATHETERIZATION  Result Date: 12/29/2021 DATE OF SERVICE: 12/29/2021  PATIENT:  [name].  66 y.o. male  PRE-OPERATIVE DIAGNOSIS:  Atherosclerosis of native arteries of right lower extremity causing diabetic foot infection; osteomyelitis  POST-OPERATIVE DIAGNOSIS:  Same  PROCEDURE:  1) US guided left common femoral artery access 2) Aortogram 3) Right lower extremity angiogram with second order cannulation (144mL total contrast) 4) Conscious sedation (33 minutes)  SURGEON:  Yevonne Aline. Stanford Breed, MD  ASSISTANT: none  ANESTHESIA:   none  ESTIMATED BLOOD LOSS: minimal  LOCAL MEDICATIONS USED:  LIDOCAINE  COUNTS: confirmed correct.  PATIENT DISPOSITION:  PACU - hemodynamically stable.  Delay start of Pharmacological VTE agent (>24hrs) due to surgical blood loss or risk of bleeding: no  INDICATION FOR PROCEDURE: Gracie Relaford. is a 66 y.o. male with osteomyelitis of the right foot with imaging evidence of severe PAD. After careful discussion of risks, benefits, and alternatives the patient was offered angiography with possible intervention. The patient understood and wished to proceed.  OPERATIVE FINDINGS: Terminal aorta and iliac arteries: Diffuse disease, heavy calcification without significant stenosis  Right lower extremity: Common femoral artery: Heavily diseased with 2 areas of significant stenosis (greater than 75%). Profunda femoris artery: Heavily diseased.  Possible ostial stenosis  limiting flow Superficial femoral artery: Heavily diseased.  Diffuse stenosis, but not greater than 50%.  1 area at Hunter's canal about 60 to 70% stenosis. Popliteal artery: Patent without significant stenosis in the above and behind knee segment.  1 area of significant stenosis above the anterior  tibial artery (about 60%) Anterior tibial artery: Dominant tibial vessel.  This courses to the foot.  Generous tibial artery. Tibioperoneal trunk: Occluded Peroneal artery: Occluded until the mid calf where it reconstitutes. Posterior tibial artery: Occluded Pedal circulation: Disadvantaged.  Fills via the anterior tibial circulation.  GLASS score. Grade 3: expect 53% 1 year re-intervention rate; expect 69% 5 year re-intervention rate; expect 49% 5 year mortality; expect 61% rate of restenosis from open operation at 5 years; expect 83% rate of restenosis from endovascular procedure..  DESCRIPTION OF PROCEDURE: After identification of the patient in the pre-operative holding area, the patient was transferred to the operating room. The patient was positioned supine on the operating room table. Anesthesia was induced. The groins was prepped and draped in standard fashion. A surgical pause was performed confirming correct patient, procedure, and operative location.  The left groin was anesthetized with subcutaneous injection of 1% lidocaine. Using ultrasound guidance, the left common femoral artery was accessed with micropuncture technique. Fluoroscopy was used to confirm cannulation over the femoral head. The 16F sheath was upsized to 16F.  A Benson wire was advanced into the distal aorta. Over the wire an omni flush catheter was advanced to the level of L2. Aortogram was performed - see above for details.  The right common iliac artery was selected with a Benson guidewire. The wire was advanced into the common femoral artery. Over the wire the omni flush catheter was advanced into the external iliac artery. Selective right lower extremity angiography was performed - see above for details.  The sheath was left in place to be removed in the recovery area.  Conscious sedation was administered with the use of IV fentanyl and midazolam under continuous physician and nurse monitoring.  Heart rate, blood pressure, and oxygen  saturation were continuously monitored.  Total sedation time was 33 minutes  Upon completion of the case instrument and sharps counts were confirmed correct. The patient was transferred to the PACU in good condition. I was present for all portions of the procedure.  PLAN: Severe, global burden of calcific atherosclerosis.  Distal external iliac and common femoral artery disease not amenable to stenting.  Diffuse femoral-popliteal disease with hemodynamically significant stenosis.  Anterior tibial artery and attractive target, but pedal outflow disadvantaged.  He is at high risk for limb loss.  Discussed the case with Dr. Unk Lightning.  I think a iliofemoral endarterectomy and femoral AT bypass is his best option we will plan to do this on Monday.  Check ABI.  Check vein mapping.  We will ask cardiology service to risk stratify for open vascular surgery.  Yevonne Aline. Stanford Breed, MD [facility] Phone Number: [phone] 12/29/2021 8:34 AM    CT ANGIO CHEST AORTA W/CM &/OR WO/CM  Result Date: 12/28/2021 CLINICAL DATA:  Leg claudication/ischemia. History of vascular disease. Clinical concern for aortic aneurysm. Ex-smoker. EXAM: CT ANGIOGRAPHY CHEST WITH CONTRAST TECHNIQUE: Multidetector CT imaging of the chest was performed using the standard protocol during bolus administration of intravenous contrast. Multiplanar CT image reconstructions and MIPs were obtained to evaluate the vascular anatomy. RADIATION DOSE REDUCTION: This exam was performed according to the departmental dose-optimization program which includes automated exposure control,  adjustment of the mA and/or kV according to patient size and/or use of iterative reconstruction technique. CONTRAST:  180mL OMNIPAQUE IOHEXOL 350 MG/ML SOLN COMPARISON:  Portable chest dated 02/04/2019. Chest, abdomen and pelvis CT dated 11/28/2018 FINDINGS: Cardiovascular: Mild atheromatous aortic calcifications. No aneurysm or dissection. Normal  sized heart. Moderate plaque formation in the posterior aspect of the abdominal aorta at the level of the renal arteries, causing approximately 40% luminal stenosis. Mediastinum/Nodes: No enlarged mediastinal, hilar, or axillary lymph nodes. Thyroid gland, trachea, and esophagus demonstrate no significant findings. Lungs/Pleura: 4 mm left lower lobe anterior nodule abutting the major fissure, unchanged. This is compatible with a stable subpleural lymph node. No new nodules. Mild bilateral centrilobular bullous changes. Upper Abdomen: Diffuse low density of the liver. Musculoskeletal: Thoracic and lower cervical spine degenerative changes, including changes of DISH. Review of the MIP images confirms the above findings. IMPRESSION: 1. No aortic aneurysm or dissection. 2. Aortic atherosclerosis, as described above. 3. Mild changes of centrilobular emphysema. 4. Diffuse hepatic steatosis. Aortic Atherosclerosis (ICD10-I70.0) and Emphysema (ICD10-J43.9). Electronically Signed   By: Claudie Revering M.D.   On: 12/28/2021 14:57   VAS [place] ABI WITH/WO TBI  Result Date: 12/29/2021  [place] STUDY Patient Name:  Christina WATSON Dicicco JR.  Date of Exam:   12/29/2021 Medical Rec #: [id number]               Accession #:    [id number] Date of Birth: 1955/12/07               Patient Gender: M Patient Age:   51 years Exam Location:  [facility] Procedure:      VAS [place] ABI WITH/WO TBI Referring Phys: Jamelle Haring --------------------------------------------------------------------------------  Indications: Left BKA and Right great toe amputation. High Risk Factors: Hypertension, hyperlipidemia, Diabetes, past history of                    smoking, coronary artery disease.  Comparison Study: 12/29/2015 ABI- right=0.78, left=0.78 Performing Technologist: Maudry Mayhew MHA, RVT, RDCS, RDMS  Examination Guidelines: A complete evaluation includes at minimum, Doppler waveform signals and systolic blood pressure reading at  the level of bilateral brachial, anterior tibial, and posterior tibial arteries, when vessel segments are accessible. Bilateral testing is considered an integral part of a complete examination. Photoelectric Plethysmograph (PPG) waveforms and toe systolic pressure readings are included as required and additional duplex testing as needed. Limited examinations for reoccurring indications may be performed as noted.  ABI Findings: +---------+------------------+-----+-------------------+----------+  Right     Rt Pressure (mmHg) Index Waveform            Comment     +---------+------------------+-----+-------------------+----------+  Brachial  177                      triphasic                       +---------+------------------+-----+-------------------+----------+  PTA       122                0.69  monophasic                      +---------+------------------+-----+-------------------+----------+  DP        61                 0.34  dampened monophasic             +---------+------------------+-----+-------------------+----------+  Great Toe                                              amputation  +---------+------------------+-----+-------------------+----------+ +---------+------------------+-----+--------+----------+  Left      Lt Pressure (mmHg) Index Waveform Comment     +---------+------------------+-----+--------+----------+  Brachial  162                      biphasic             +---------+------------------+-----+--------+----------+  PTA                                         amputation  +---------+------------------+-----+--------+----------+  DP                                          amputation  +---------+------------------+-----+--------+----------+  Great Toe                                   amputation  +---------+------------------+-----+--------+----------+ +-------+-----------+-----------+------------+------------+  ABI/TBI Today's ABI Today's TBI Previous ABI Previous TBI   +-------+-----------+-----------+------------+------------+  Right   0.69        amputation  0.78                       +-------+-----------+-----------+------------+------------+  Left    BKA         BKA         0.78                       +-------+-----------+-----------+------------+------------+  Summary: Right: Resting right ankle-brachial index indicates moderate right lower extremity arterial disease.  *See table(s) above for measurements and observations.     Preliminary    VAS [place] LOWER EXTREMITY SAPHENOUS VEIN MAPPING  Result Date: 12/29/2021 LOWER EXTREMITY VEIN MAPPING Patient Name:  Burris Albers [facility].  Date of Exam:   12/29/2021 Medical Rec #: [id number]               Accession #:    [id number] Date of Birth: 17-Jan-1956               Patient Gender: M Patient Age:   30 years Exam Location:  [facility] Procedure:      VAS [place] LOWER EXTREMITY SAPHENOUS VEIN MAPPING Referring Phys: Jamelle Haring --------------------------------------------------------------------------------  Indications:  Pre-op Risk Factors: Hypertension, hyperlipidemia, Diabetes, past history of smoking,               coronary artery disease.  Comparison Study: No prior study Performing Technologist: Maudry Mayhew MHA, RDMS, RVT, RDCS  Examination Guidelines: A complete evaluation includes B-mode imaging, spectral Doppler, color Doppler, and power Doppler as needed of all accessible portions of each vessel. Bilateral testing is considered an integral part of a complete examination. Limited examinations for reoccurring indications may be performed as noted. +--------------+-----------+----------------------+--------------+-------------+   RT Diameter   RT Findings          GSV            LT Diameter    LT Findings         (  cm)                                              (cm)                     +--------------+-----------+----------------------+--------------+-------------+       0.73                      Saphenofemoral           0.86                                                        Junction                                      +--------------+-----------+----------------------+--------------+-------------+       0.56                      Proximal thigh          0.66                     +--------------+-----------+----------------------+--------------+-------------+       0.47                        Mid thigh             0.26                     +--------------+-----------+----------------------+--------------+-------------+       0.41                       Distal thigh           0.24                     +--------------+-----------+----------------------+--------------+-------------+       0.40       branching           Knee               0.20                     +--------------+-----------+----------------------+--------------+-------------+       0.42       branching        Prox calf                            not                                                                          visualized    +--------------+-----------+----------------------+--------------+-------------+       0.35       branching  Mid calf                            not                                                                          visualized    +--------------+-----------+----------------------+--------------+-------------+       0.38                       Distal calf                           not                                                                          visualized    +--------------+-----------+----------------------+--------------+-------------+       0.40                          Ankle                              not                                                                          visualized    +--------------+-----------+----------------------+--------------+-------------+ +----------------+-----------+---------------+----------------+--------------+  RT diameter (cm) RT Findings       SSV       LT Diameter  (cm)  LT Findings    +----------------+-----------+---------------+----------------+--------------+        0.29                   Popliteal fossa                  not visualized  +----------------+-----------+---------------+----------------+--------------+        0.24                    Proximal calf                   not visualized  +----------------+-----------+---------------+----------------+--------------+        0.34                      Mid calf                      not visualized  +----------------+-----------+---------------+----------------+--------------+        0.38        branching    Distal calf  not visualized  +----------------+-----------+---------------+----------------+--------------+    Preliminary    - pertinent xrays, CT, MRI studies were reviewed and independently interpreted  Positive ROS: All other systems have been reviewed and were otherwise negative with the exception of those mentioned in the HPI and as above.  Physical Exam: General: Alert, no acute distress Psychiatric: Patient is competent for consent with normal mood and affect Lymphatic: No axillary or cervical lymphadenopathy Cardiovascular: No pedal edema Respiratory: No cyanosis, no use of accessory musculature GI: No organomegaly, abdomen is soft and non-tender    Images:  @ENCIMAGES @  Labs:  Lab Results  Component Value Date   HGBA1C 11.3 (H) 12/28/2021   HGBA1C 7.3 (H) 01/21/2019   HGBA1C 12.3 (H) 03/25/2017   ESRSEDRATE 32 (H) 12/30/2021   ESRSEDRATE 17 (H) 03/24/2017   ESRSEDRATE 85 (H) 12/28/2015   CRP 1.6 (H) 12/30/2021   CRP 15.5 (H) 02/05/2019   CRP 2.2 (H) 03/24/2017   REPTSTATUS PENDING 12/28/2021   GRAMSTAIN  02/04/2019    ABUNDANT WBC PRESENT, PREDOMINANTLY PMN RARE SQUAMOUS EPITHELIAL CELLS PRESENT RARE GRAM POSITIVE RODS Performed at [facility], [place] [address] [place], [place] [address]    CULT  12/28/2021    NO GROWTH 1 DAY Performed at [place] [address] [place],  [address]    LABORGA PSEUDOMONAS AERUGINOSA 02/04/2019    Lab Results  Component Value Date   ALBUMIN 3.0 (L) 02/09/2019   ALBUMIN 2.5 (L) 02/04/2019   ALBUMIN 2.6 (L) 01/27/2019     CBC EXTENDED Latest Ref Rng & Units 12/30/2021 12/29/2021 12/28/2021  WBC 4.0 - 10.5 K/uL 7.4 6.8 -  RBC 4.22 - 5.81 MIL/uL 4.86 4.99 -  HGB 13.0 - 17.0 g/dL 14.4 15.0 16.7  HCT 39.0 - 52.0 % 42.7 43.3 49.0  PLT 150 - 400 K/uL 219 216 -  NEUTROABS 1.7 - 7.7 K/uL 6.1 - -  LYMPHSABS 0.7 - 4.0 K/uL 0.8 - -    Neurologic: Patient does not have protective sensation bilateral lower extremities.   MUSCULOSKELETAL:   Skin: Patient has a well-healed left transtibial amputation he lacks about 20 degrees to full extension of the left knee he has a good fitting prosthesis.  Examination the right foot he has a dorsal necrotic ulcer over the fifth metatarsal head the bone is exposed.  Radiograph shows chronic destructive bony changes.  Patient underwent endovascular evaluation yesterday.  There is brawny dermatitis in the right leg.  Patient with poorly controlled diabetes with a hemoglobin A1c of 11.3.  Assessment: Assessment: Stable left transtibial amputation with necrotic ulceration fifth metatarsal head on the right with both arterial and venous insufficiency.  Plan: Patient is scheduled for endovascular surgery with vascular vein surgery on Monday.  Anticipate a transmetatarsal amputation could be incorporated into the vascular procedure.  I will follow-up as needed.  Thank you for the consult and the opportunity to see Mr. Shamond Cury, [place] [phone] 9:39 AM

## 2021-12-30 NOTE — Progress Notes (Addendum)
Pharmacy Antibiotic Note  Antonio Teel. is a 66 y.o. male admitted on 12/28/2021 with wound infection.  Pharmacy has been consulted for vancomycin and cefepime dosing. WBC WNL and afebrile.  Plan: Reduce vancomycin dose to 1 gram every 12 hours for [facility] of 445 with Scr 1.1. Continue Cefepime IV 2g q8h Trend WBC, temp, renal function  F/U infectious work-up and narrowing of antibiotics   Height: 6' (182.9 cm) Weight: 93 kg (205 lb) IBW/kg (Calculated) : 77.6  Temp (24hrs), Avg:97.8 F (36.6 C), Min:97.7 F (36.5 C), Max:98 F (36.7 C)  Recent Labs  Lab 12/28/21 0815 12/28/21 0823 12/28/21 0940 12/29/21 0615 12/30/21 0144  WBC 6.9  --   --  6.8 7.4  CREATININE 0.84 0.80  --  1.04 1.10  LATICACIDVEN  --   --  1.1  --   --      Estimated Creatinine Clearance: 75.4 mL/min (by C-G formula based on SCr of 1.1 mg/dL).    Allergies  Allergen Reactions   Iodinated Contrast Media Hives, Rash and Other (See Comments)    Blisters Staph Blisters / staph   Penicillins Other (See Comments)    UNSPECIFIED REACTION  Has patient had a PCN reaction causing immediate rash, facial/tongue/throat swelling, SOB or lightheadedness with hypotension: No Has patient had a PCN reaction causing severe rash involving mucus membranes or skin necrosis: No Has patient had a PCN reaction that required hospitalization: No Has patient had a PCN reaction occurring within the last 10 years: No If all of the above answers are "NO", then may proceed with Cephalosporin use.    Antimicrobials this admission: Ceftriaxone x1 2/2  Vanc 2/2 >>  Cefepime 2/2>> Flagyl 2/3>>   Microbiology results: 2/2 BCx: NG x 2 days MRSA PCR negative on 2/3   Thank you for allowing pharmacy to participate in this patient's care.  Enos Fling, PharmD PGY1 Pharmacy Resident 12/30/2021 10:44 AM Check [email or web address] for unit specific pharmacy number

## 2021-12-30 NOTE — Progress Notes (Signed)
°  Progress Note    12/30/2021 8:52 AM 1 Day Post-Op  Subjective:  no complaints. Sitting on side of bed   Vitals:   12/30/21 0449 12/30/21 0624  BP: (!) 190/87 (!) 181/93  Pulse: 60 61  Resp: 18 17  Temp: 97.7 F (36.5 C)   SpO2: 97%    Physical Exam: Cardiac:  regular Lungs:  non labored Extremities:  L femoral access site c/d/I without swelling or hematoma Abdomen:  obese, non distended Neurologic: alert and oriented  CBC    Component Value Date/Time   WBC 7.4 12/30/2021 0144   RBC 4.86 12/30/2021 0144   HGB 14.4 12/30/2021 0144   HCT 42.7 12/30/2021 0144   PLT 219 12/30/2021 0144   MCV 87.9 12/30/2021 0144   MCH 29.6 12/30/2021 0144   MCHC 33.7 12/30/2021 0144   RDW 12.5 12/30/2021 0144   LYMPHSABS 0.8 12/30/2021 0144   MONOABS 0.4 12/30/2021 0144   EOSABS 0.0 12/30/2021 0144   BASOSABS 0.0 12/30/2021 0144    BMET    Component Value Date/Time   NA 135 12/30/2021 0144   K 5.3 (H) 12/30/2021 0144   CL 101 12/30/2021 0144   CO2 25 12/30/2021 0144   GLUCOSE 242 (H) 12/30/2021 0144   BUN 28 (H) 12/30/2021 0144   CREATININE 1.10 12/30/2021 0144   CALCIUM 8.1 (L) 12/30/2021 0144   GFRNONAA >60 12/30/2021 0144   GFRAA >60 02/15/2019 0455    INR    Component Value Date/Time   INR 1.26 12/23/2018 0531     Intake/Output Summary (Last 24 hours) at 12/30/2021 0852 Last data filed at 12/30/2021 0700 Gross per 24 hour  Intake 1300 ml  Output 1270 ml  Net 30 ml     Assessment/Plan:  66 y.o. male is s/p Angiogram 1 Day Post-Op   No endovascular options for revascularization L femoral access site is c/d/I without swelling or hematoma Appreciate Cardiology Assistance. Pending Echo results for cardiac clearance Vein mapping showing adequate conduit, does get smaller distally Plan is for femoral endarterectomy and right Femoral to AT bypass on 2/6   Graceann Congress, PA-C [facility] [phone] 12/30/2021 8:52 AM

## 2021-12-30 NOTE — Progress Notes (Addendum)
HD#2 Subjective:  Overnight Events: No acute events overnight  Patient resting in bed comfortably..  Denies fever or chills.  Continues to have pain on his right foot.  Passing his bowels and urinating without difficulty.  Discussed with him and the plan for revascularization on Monday with possible amputation.  Patient has no further questions at this time.  Objective:  Vital signs in last 24 hours: Vitals:   12/29/21 1618 12/29/21 2029 12/29/21 2323 12/30/21 0449  BP: (!) 166/92 (!) 165/70 (!) 153/79 (!) 190/87  Pulse: 60 74 67 60  Resp: _0 Temp: 97.7 F (36.5 C) 98 F (36.7 C) 97.7 F (36.5 C) 97.7 F (36.5 C)  TempSrc: Oral Oral Oral Oral  SpO2: 93% 97% 98% 97%  Weight:      Height:       Supplemental O2: Room air   Physical Exam:  Constitutional: Elderly appearing gentleman in no acute distress HENT: normocephalic atraumatic Eyes: conjunctiva non-erythematous Neck: supple Cardiovascular: regular rate and rhythm, no m/r/g Pulmonary/Chest: normal work of breathing on room air MSK: normal bulk and tone.  Left-sided BKA.  Necrotic foot ulcer over fifth metatarsal head with bony exposure. Neurological: alert & oriented x 3 Skin: warm and dry Psych: Normal mood and thought process  Filed Weights   12/28/21 0924  Weight: 93 kg     Intake/Output Summary (Last 24 hours) at 12/30/2021 0622 Last data filed at 12/30/2021 0329 Gross per 24 hour  Intake 1696.29 ml  Output 1020 ml  Net 676.29 ml   Net IO Since Admission: 716.29 mL [12/30/21 0622]  Pertinent Labs: CBC Latest Ref Rng & Units 12/30/2021 12/29/2021 12/28/2021  WBC 4.0 - 10.5 K/uL 7.4 6.8 -  Hemoglobin 13.0 - 17.0 g/dL 14.4 15.0 16.7  Hematocrit 39.0 - 52.0 % 42.7 43.3 49.0  Platelets 150 - 400 K/uL 219 216 -    CMP Latest Ref Rng & Units 12/30/2021 12/29/2021 12/28/2021  Glucose 70 - 99 mg/dL 242(H) 210(H) 221(H)  BUN 8 - 23 mg/dL 28(H) 16 7(L)  Creatinine 0.61 - 1.24 mg/dL 1.10 1.04 0.80  Sodium  135 - 145 mmol/L 135 137 138  Potassium 3.5 - 5.1 mmol/L 5.3(H) 3.9 4.0  Chloride 98 - 111 mmol/L 101 102 97(L)  CO2 22 - 32 mmol/L 25 27 -  Calcium 8.9 - 10.3 mg/dL 8.1(L) 8.2(L) -  Total Protein 6.5 - 8.1 g/dL - - -  Total Bilirubin 0.3 - 1.2 mg/dL - - -  Alkaline Phos 38 - 126 U/L - - -  AST 15 - 41 U/L - - -  ALT 0 - 44 U/L - - -    Imaging: PERIPHERAL VASCULAR CATHETERIZATION  Result Date: 12/29/2021 DATE OF SERVICE: 12/29/2021  PATIENT:  Antonio [name].  66 y.o. male  PRE-OPERATIVE DIAGNOSIS:  Atherosclerosis of native arteries of right lower extremity causing diabetic foot infection; osteomyelitis  POST-OPERATIVE DIAGNOSIS:  Same  PROCEDURE:  1) US guided left common femoral artery access 2) Aortogram 3) Right lower extremity angiogram with second order cannulation (139m total contrast) 4) Conscious sedation (33 minutes)  SURGEON:  TYevonne Aline HStanford Breed MD  ASSISTANT: none  ANESTHESIA:   none  ESTIMATED BLOOD LOSS: minimal  LOCAL MEDICATIONS USED:  LIDOCAINE  COUNTS: confirmed correct.  PATIENT DISPOSITION:  PACU - hemodynamically stable.  Delay start of Pharmacological VTE agent (>24hrs) due to surgical blood loss or risk of bleeding: no  INDICATION FOR PROCEDURE: DMercy [name]  Antonio [name]. is a 66 y.o. male with osteomyelitis of the right foot with imaging evidence of severe PAD. After careful discussion of risks, benefits, and alternatives the patient was offered angiography with possible intervention. The patient understood and wished to proceed.  OPERATIVE FINDINGS: Terminal aorta and iliac arteries: Diffuse disease, heavy calcification without significant stenosis  Right lower extremity: Common femoral artery: Heavily diseased with 2 areas of significant stenosis (greater than 75%). Profunda femoris artery: Heavily diseased.  Possible ostial stenosis limiting flow Superficial femoral artery: Heavily diseased.  Diffuse stenosis, but not greater than 50%.  1 area at Hunter's canal about 60 to  70% stenosis. Popliteal artery: Patent without significant stenosis in the above and behind knee segment.  1 area of significant stenosis above the anterior tibial artery (about 60%) Anterior tibial artery: Dominant tibial vessel.  This courses to the foot.  Generous tibial artery. Tibioperoneal trunk: Occluded Peroneal artery: Occluded until the mid calf where it reconstitutes. Posterior tibial artery: Occluded Pedal circulation: Disadvantaged.  Fills via the anterior tibial circulation.  GLASS score. Grade 3: expect 53% 1 year re-intervention rate; expect 69% 5 year re-intervention rate; expect 49% 5 year mortality; expect 61% rate of restenosis from open operation at 5 years; expect 83% rate of restenosis from endovascular procedure..  DESCRIPTION OF PROCEDURE: After identification of the patient in the pre-operative holding area, the patient was transferred to the operating room. The patient was positioned supine on the operating room table. Anesthesia was induced. The groins was prepped and draped in standard fashion. A surgical pause was performed confirming correct patient, procedure, and operative location.  The left groin was anesthetized with subcutaneous injection of 1% lidocaine. Using ultrasound guidance, the left common femoral artery was accessed with micropuncture technique. Fluoroscopy was used to confirm cannulation over the femoral head. The 48F sheath was upsized to 25F.  A Benson wire was advanced into the distal aorta. Over the wire an omni flush catheter was advanced to the level of L2. Aortogram was performed - see above for details.  The right common iliac artery was selected with a Benson guidewire. The wire was advanced into the common femoral artery. Over the wire the omni flush catheter was advanced into the external iliac artery. Selective right lower extremity angiography was performed - see above for details.  The sheath was left in place to be removed in the recovery area.  Conscious  sedation was administered with the use of IV fentanyl and midazolam under continuous physician and nurse monitoring.  Heart rate, blood pressure, and oxygen saturation were continuously monitored.  Total sedation time was 33 minutes  Upon completion of the case instrument and sharps counts were confirmed correct. The patient was transferred to the PACU in good condition. I was present for all portions of the procedure.  PLAN: Severe, global burden of calcific atherosclerosis.  Distal external iliac and common femoral artery disease not amenable to stenting.  Diffuse femoral-popliteal disease with hemodynamically significant stenosis.  Anterior tibial artery and attractive target, but pedal outflow disadvantaged.  He is at high risk for limb loss.  Discussed the case with Dr. Unk Lightning.  I think a iliofemoral endarterectomy and femoral AT bypass is his best option we will plan to do this on Monday.  Check ABI.  Check vein mapping.  We will ask cardiology service to risk stratify for open vascular surgery.  Yevonne Aline. Stanford Breed, MD [facility] Phone Number: [phone] 12/29/2021 8:34 AM  VAS [place] ABI WITH/WO TBI  Result Date: 12/29/2021  LOWER EXTREMITY DOPPLER STUDY Patient Name:  Jartavious WATSON Podgorski JR.  Date of Exam:   12/29/2021 Medical Rec #: [id number]               Accession #:    [id number] Date of Birth: 10-04-1956               Patient Gender: M Patient Age:   66 years Exam Location:  [facility] Procedure:      VAS [place] ABI WITH/WO TBI Referring Phys: Jamelle Haring --------------------------------------------------------------------------------  Indications: Left BKA and Right great toe amputation. High Risk Factors: Hypertension, hyperlipidemia, Diabetes, past history of                    smoking, coronary artery disease.  Comparison Study: 12/29/2015 ABI- right=0.78, left=0.78 Performing Technologist: Maudry Mayhew MHA, RVT, RDCS, RDMS  Examination Guidelines: A  complete evaluation includes at minimum, Doppler waveform signals and systolic blood pressure reading at the level of bilateral brachial, anterior tibial, and posterior tibial arteries, when vessel segments are accessible. Bilateral testing is considered an integral part of a complete examination. Photoelectric Plethysmograph (PPG) waveforms and toe systolic pressure readings are included as required and additional duplex testing as needed. Limited examinations for reoccurring indications may be performed as noted.  ABI Findings: +---------+------------------+-----+-------------------+----------+  Right     Rt Pressure (mmHg) Index Waveform            Comment     +---------+------------------+-----+-------------------+----------+  Brachial  177                      triphasic                       +---------+------------------+-----+-------------------+----------+  PTA       122                0.69  monophasic                      +---------+------------------+-----+-------------------+----------+  DP        61                 0.34  dampened monophasic             +---------+------------------+-----+-------------------+----------+  Great Toe                                              amputation  +---------+------------------+-----+-------------------+----------+ +---------+------------------+-----+--------+----------+  Left      Lt Pressure (mmHg) Index Waveform Comment     +---------+------------------+-----+--------+----------+  Brachial  162                      biphasic             +---------+------------------+-----+--------+----------+  PTA                                         amputation  +---------+------------------+-----+--------+----------+  DP  amputation  +---------+------------------+-----+--------+----------+  Great Toe                                   amputation  +---------+------------------+-----+--------+----------+  +-------+-----------+-----------+------------+------------+  ABI/TBI Today's ABI Today's TBI Previous ABI Previous TBI  +-------+-----------+-----------+------------+------------+  Right   0.69        amputation  0.78                       +-------+-----------+-----------+------------+------------+  Left    BKA         BKA         0.78                       +-------+-----------+-----------+------------+------------+  Summary: Right: Resting right ankle-brachial index indicates moderate right lower extremity arterial disease.  *See table(s) above for measurements and observations.     Preliminary    VAS [place] LOWER EXTREMITY SAPHENOUS VEIN MAPPING  Result Date: 12/29/2021 LOWER EXTREMITY VEIN MAPPING Patient Name:  [facility]io [name]  Date of Exam:   12/29/2021 Medical Rec #: [id number]               Accession #:    [id number] Date of Birth: [birth year]               Patient Gender: M Patient Age:   53 years Exam Location:  [facility] Procedure:      VAS [place] LOWER EXTREMITY SAPHENOUS VEIN MAPPING Referring Phys: Jamelle Haring --------------------------------------------------------------------------------  Indications:  Pre-op Risk Factors: Hypertension, hyperlipidemia, Diabetes, past history of smoking,               coronary artery disease.  Comparison Study: No prior study Performing Technologist: Maudry Mayhew MHA, RDMS, RVT, RDCS  Examination Guidelines: A complete evaluation includes B-mode imaging, spectral Doppler, color Doppler, and power Doppler as needed of all accessible portions of each vessel. Bilateral testing is considered an integral part of a complete examination. Limited examinations for reoccurring indications may be performed as noted. +--------------+-----------+----------------------+--------------+-------------+   RT Diameter   RT Findings          GSV            LT Diameter    LT Findings         (cm)                                              (cm)                      +--------------+-----------+----------------------+--------------+-------------+       0.73                      Saphenofemoral          0.86                                                        Junction                                      +--------------+-----------+----------------------+--------------+-------------+  0.56                      Proximal thigh          0.66                     +--------------+-----------+----------------------+--------------+-------------+       0.47                        Mid thigh             0.26                     +--------------+-----------+----------------------+--------------+-------------+       0.41                       Distal thigh           0.24                     +--------------+-----------+----------------------+--------------+-------------+       0.40       branching           Knee               0.20                     +--------------+-----------+----------------------+--------------+-------------+       0.42       branching        Prox calf                            not                                                                          visualized    +--------------+-----------+----------------------+--------------+-------------+       0.35       branching         Mid calf                            not                                                                          visualized    +--------------+-----------+----------------------+--------------+-------------+       0.38                       Distal calf                           not  visualized    +--------------+-----------+----------------------+--------------+-------------+       0.40                          Ankle                              not                                                                          visualized    +--------------+-----------+----------------------+--------------+-------------+  +----------------+-----------+---------------+----------------+--------------+  RT diameter (cm) RT Findings       SSV       LT Diameter (cm)  LT Findings    +----------------+-----------+---------------+----------------+--------------+        0.29                   Popliteal fossa                  not visualized  +----------------+-----------+---------------+----------------+--------------+        0.24                    Proximal calf                   not visualized  +----------------+-----------+---------------+----------------+--------------+        0.34                      Mid calf                      not visualized  +----------------+-----------+---------------+----------------+--------------+        0.38        branching    Distal calf                    not visualized  +----------------+-----------+---------------+----------------+--------------+    Preliminary     Assessment/Plan:   Principal Problem:   Osteomyelitis (HCC) Active Problems:   Type 2 diabetes mellitus with hyperglycemia, with long-term current use of insulin (HCC)   Hypertension   PVD (peripheral vascular disease) ([place])   Patient Summary: Antonio Vetrano. is a 66 y.o. with a history of poorly controlled hypertension, type 2 diabetes, PAD status post left BKA in 2019 and tobacco use, who presented with 1 month of worsening right foot ulcer with acute leg pain at rest and admitted for further work-up and evaluation for osteomyelitis of the right fifth metatarsophalangeal joint.    Osteomyelitis of the right fifth metatarsophalangeal joint Patient with aortogram performed yesterday with evidence of severe global burden of calcific sclerosis.  Distal external iliac and common femoral artery not amenable to stenting.  An iliofemoral endarterectomy and femoral AT bypass is planned to be performed Monday, 01/01/2022.  Evaluated by orthopedics anticipate a transmetatarsal amputation with a vascular procedure.  If this does  occur and clean margins are met, he will not need further antibiotic therapy. Cardiology evaluated for clearance and recommend repeat echocardiogram. -Appreciate vascular surgery as well as orthopedics recommendations and assistance -Plan for endarterectomy and bypass on Monday, with possible transmetatarsal amputation -Echocardiogram, vein mapping, ABIs pending -Continue  vancomycin, cefepime, Flagyl, day 3. Blood cultures no growth to date -Plan for physical therapy evaluation after procedure to determine if he will need rehabilitation if amputation occurs  History of uncontrolled hypertension Continues to be persistently hypertensive.  Current regimen of amlodipine and carvedilol.  Yesterday carvedilol was increased to 12.5 with hold parameters and hydralazine 10 mg 3 times daily was also added.  Would recommend avoiding IV hydralazine due to its variability and affect.  Patient would benefit from an ARB however with contrast load on his kidneys not started at this time. -Carvedilol 12.5 twice daily, hydralazine 10 mg 3 times daily and amlodipine 10 mg daily  Uncontrolled diabetes, A1c of 11.3% Continues to have hyperglycemia.  Current regimen of Semglee 18 units nightly, NovoLog sliding scale nightly, NovoLog and NovoLog sliding scale with meals.  We will increase mealtime ins NovoLog up to to 20 units sliding scale, increase Semglee to 22 units.  We will continue to adjust daily.  Diet: Carb-Modified IVF: With antibiotics VTE: Enoxaparin Code: Full PT/OT recs: We will evaluate after possible amputation TOC recs: Assistance with PCP  Dispo: Anticipated discharge uncertain at this time awaiting further work-up and evaluation by orthopedics and vascular surgery.  Sanjuana Letters DO Internal Medicine Resident PGY-2 Pager [phone] Please contact the on call pager after 5 pm and on weekends at [phone].

## 2021-12-31 ENCOUNTER — Inpatient Hospital Stay (HOSPITAL_COMMUNITY): Payer: Medicaid Other

## 2021-12-31 DIAGNOSIS — E1169 Type 2 diabetes mellitus with other specified complication: Secondary | ICD-10-CM | POA: Diagnosis not present

## 2021-12-31 DIAGNOSIS — Z01810 Encounter for preprocedural cardiovascular examination: Secondary | ICD-10-CM

## 2021-12-31 DIAGNOSIS — L97519 Non-pressure chronic ulcer of other part of right foot with unspecified severity: Secondary | ICD-10-CM

## 2021-12-31 DIAGNOSIS — I10 Essential (primary) hypertension: Secondary | ICD-10-CM | POA: Diagnosis not present

## 2021-12-31 DIAGNOSIS — M86171 Other acute osteomyelitis, right ankle and foot: Secondary | ICD-10-CM

## 2021-12-31 LAB — CBC WITH DIFFERENTIAL/PLATELET
Abs Immature Granulocytes: 0.07 10*3/uL (ref 0.00–0.07)
Basophils Absolute: 0.1 10*3/uL (ref 0.0–0.1)
Basophils Relative: 1 %
Eosinophils Absolute: 0.1 10*3/uL (ref 0.0–0.5)
Eosinophils Relative: 1 %
HCT: 44.8 % (ref 39.0–52.0)
Hemoglobin: 14.9 g/dL (ref 13.0–17.0)
Immature Granulocytes: 1 %
Lymphocytes Relative: 23 %
Lymphs Abs: 2 10*3/uL (ref 0.7–4.0)
MCH: 29.4 pg (ref 26.0–34.0)
MCHC: 33.3 g/dL (ref 30.0–36.0)
MCV: 88.5 fL (ref 80.0–100.0)
Monocytes Absolute: 0.6 10*3/uL (ref 0.1–1.0)
Monocytes Relative: 7 %
Neutro Abs: 6.1 10*3/uL (ref 1.7–7.7)
Neutrophils Relative %: 67 %
Platelets: 218 10*3/uL (ref 150–400)
RBC: 5.06 MIL/uL (ref 4.22–5.81)
RDW: 12.8 % (ref 11.5–15.5)
WBC: 8.9 10*3/uL (ref 4.0–10.5)
nRBC: 0 % (ref 0.0–0.2)

## 2021-12-31 LAB — ECHOCARDIOGRAM COMPLETE
AR max vel: 2.51 cm2
AV Area VTI: 2.47 cm2
AV Area mean vel: 2.35 cm2
AV Mean grad: 4 mmHg
AV Peak grad: 7.4 mmHg
Ao pk vel: 1.36 m/s
Area-P 1/2: 3.45 cm2
Calc EF: 61.7 %
Height: 72 in
MV VTI: 2.14 cm2
S' Lateral: 4.3 cm
Single Plane A2C EF: 56 %
Single Plane A4C EF: 61.8 %
Weight: 3280 oz

## 2021-12-31 LAB — BASIC METABOLIC PANEL
Anion gap: 11 (ref 5–15)
BUN: 36 mg/dL — ABNORMAL HIGH (ref 8–23)
CO2: 21 mmol/L — ABNORMAL LOW (ref 22–32)
Calcium: 7.9 mg/dL — ABNORMAL LOW (ref 8.9–10.3)
Chloride: 104 mmol/L (ref 98–111)
Creatinine, Ser: 1.17 mg/dL (ref 0.61–1.24)
GFR, Estimated: >60 mL/min (ref >60)
Glucose, Bld: 231 mg/dL — ABNORMAL HIGH (ref 70–99)
Potassium: 4.6 mmol/L (ref 3.5–5.1)
Sodium: 136 mmol/L (ref 135–145)

## 2021-12-31 LAB — GLUCOSE, CAPILLARY
Glucose-Capillary: 178 mg/dL — ABNORMAL HIGH (ref 70–99)
Glucose-Capillary: 155 mg/dL — ABNORMAL HIGH (ref 70–99)
Glucose-Capillary: 171 mg/dL — ABNORMAL HIGH (ref 70–99)
Glucose-Capillary: 200 mg/dL — ABNORMAL HIGH (ref 70–99)

## 2021-12-31 MED ORDER — PERFLUTREN LIPID MICROSPHERE
1.0000 mL | INTRAVENOUS | Status: AC | PRN
  Administered 2021-12-31: 3 mL via INTRAVENOUS
  Filled 2021-12-31: qty 10

## 2021-12-31 MED ORDER — INSULIN GLARGINE-YFGN 100 UNIT/ML ~~LOC~~ SOLN
24.0000 [IU] | Freq: Every day | SUBCUTANEOUS | Status: DC
  Filled 2021-12-31: qty 0.24

## 2021-12-31 MED ORDER — INSULIN GLARGINE-YFGN 100 UNIT/ML ~~LOC~~ SOLN
22.0000 [IU] | Freq: Every day | SUBCUTANEOUS | Status: DC
  Administered 2021-12-31 – 2022-01-01 (×2): 22 [IU] via SUBCUTANEOUS
  Filled 2021-12-31 (×4): qty 0.22

## 2021-12-31 MED ORDER — HYDROMORPHONE HCL 1 MG/ML IJ SOLN
1.0000 mg | INTRAMUSCULAR | Status: DC | PRN
  Administered 2021-12-31 – 2022-01-01 (×6): 1 mg via INTRAVENOUS
  Filled 2021-12-31 (×6): qty 1

## 2021-12-31 NOTE — Progress Notes (Signed)
° °  Echo remains pending - should be done today. If LVEF grossly preserved, would be at acceptable risk to proceed with surgery on Monday.  Chrystie Nose, MD, [facility], FACP  [place]   [facility]  Medical Director of the [facility] Lipid Disorders &  Cardiovascular Risk Reduction Clinic Diplomate of the American Board of Clinical Lipidology Attending Cardiologist  Direct Dial: [phone]   Fax: [phone]  Website:  [email or web address]

## 2021-12-31 NOTE — Progress Notes (Addendum)
Subjective: I seen and evaluated Mr. Spielberger at bedside.  He states that his right foot is giving him pain.  Otherwise, no new complaints.  He is aware that he has procedure tomorrow morning and does not have any further questions.  Objective:  Vital signs in last 24 hours: Vitals:   12/30/21 2305 12/31/21 0531 12/31/21 0623 12/31/21 0823  BP: (!) 151/75 (!) 172/80 (!) 164/84 (!) 168/80  Pulse: 62 64 94 68  Resp: 16  20 20   Temp: 97.6 F (36.4 C) 97.7 F (36.5 C)  97.6 F (36.4 C)  TempSrc: Oral Oral  Oral  SpO2: 96% 96%  95%  Weight:      Height:       Physical Exam Constitutional:      General: He is awake. He is not in acute distress. Cardiovascular:     Rate and Rhythm: Normal rate and regular rhythm.     Heart sounds: Normal heart sounds.  Pulmonary:     Effort: Pulmonary effort is normal.     Breath sounds: Normal breath sounds.  Musculoskeletal:     Right lower leg: No edema.     Left lower leg: No edema.     Comments: Right lower legs skin changes; erythema; discoloration.     Left Lower Extremity: Left leg is amputated below knee.  Neurological:     Mental Status: He is alert.  Psychiatric:        Behavior: Behavior is cooperative.     Assessment/Plan:  Principal Problem:   Osteomyelitis ([place]) Active Problems:   Type 2 diabetes mellitus with hyperglycemia, with long-term current use of insulin (HCC)   Hypertension   PVD (peripheral vascular disease) ([place])   Preoperative cardiovascular examination   Johnston Homan. is a 66 y.o. with a history of poorly controlled hypertension, type 2 diabetes, PAD status post left BKA in 2019 and tobacco use, who presented with 1 month of worsening right foot ulcer with acute leg pain at rest and admitted for further work-up and evaluation for osteomyelitis of the right fifth metatarsophalangeal joint.    Osteomyelitis of the right fifth metatarsal An iliofemoral endarterectomy and femoral AT bypass is planned to  be performed Monday, 01/01/2022.  Orthopedics anticipate transmetatarsal amputation along with vascular procedure.  Source control following procedure will dictate further antibiotic therapy indication.  Blood cultures no growth to date.  Vein mapping reveals adequate conduits, get smaller distally.  ABIs significant for PAD of the right leg.  Echocardiogram is pending.  Patient complaining of severe pain of the right leg despite current pain regimen; will adjust. --N.p.o. at midnight --Continue vancomycin, cefepime, and Flagyl; day 4 --Pain control Dilaudid 0.5 increased --> Dilaudid 1 mg every 4 hours; oxycodone 5 mg every 6 as needed  Hypertension Current regimen includes amlodipine 10 mg; carvedilol 12.5 mg and hydralazine 10 mg 3 times daily.  Patient continues to be hypertensive despite 3 drug regimen.  Patient will benefit from an ARB; creatinine levels are within reference range. -- Continue current regimen; consider adding an ARB following surgical procedure tomorrow.  Type 2 diabetes with complications Current 123456 11.3%.  Semglee dosing continues to be titrated up due to uncontrolled fasting glucose readings.  Current dosing Semglee 22 units at bedtime.  However, fasting morning glucose continues to be elevated > 170.  Tight glucose control is necessary to promote wound healing.  Patient is scheduled for procedure tomorrow as stated above.  Consider increasing Semglee dose after procedure.  --  Semglee 22 units at bedtime --SSI resistance scale -- Continue glucose monitoring   Prior to Admission Living Arrangement: Anticipated Discharge Location: Barriers to Discharge: Dispo: Anticipated discharge in approximately 1-2 day(s).   Timothy Lasso, MD 12/31/2021, 8:53 AM Pager: [phone] After 5pm on weekdays and 1pm on weekends: On Call pager [phone]

## 2021-12-31 NOTE — Progress Notes (Signed)
Subjective  -   No complaints today   Physical Exam:  Right foot dressing is intact with some drainage. Groin cannulation site soft      Assessment/Plan:    Right foot ulcer: I discussed with the patient that we will proceed with right femoral endarterectomy with patch angioplasty and partial foot amputation.  He understands that he may require subsequent procedures including angiography and stenting, based off of evaluation of the blood flow at the level of his amputation.  He will be n.p.o. after midnight.  Echo cardiogram is pending.  Cardiology has stated we can proceed with surgery tomorrow if his ejection fraction is preserved.  Antonio [name] 12/31/2021 12:22 PM --  Vitals:   12/31/21 0823 12/31/21 1125  BP: (!) 168/80 (!) 160/85  Pulse: 68 63  Resp: 20 18  Temp: 97.6 F (36.4 C) 97.6 F (36.4 C)  SpO2: 95% 93%    Intake/Output Summary (Last 24 hours) at 12/31/2021 1222 Last data filed at 12/31/2021 1127 Gross per 24 hour  Intake 770 ml  Output 1625 ml  Net -855 ml     Laboratory CBC    Component Value Date/Time   WBC 8.9 12/31/2021 0137   HGB 14.9 12/31/2021 0137   HCT 44.8 12/31/2021 0137   PLT 218 12/31/2021 0137    BMET    Component Value Date/Time   NA 136 12/31/2021 0137   K 4.6 12/31/2021 0137   CL 104 12/31/2021 0137   CO2 21 (L) 12/31/2021 0137   GLUCOSE 231 (H) 12/31/2021 0137   BUN 36 (H) 12/31/2021 0137   CREATININE 1.17 12/31/2021 0137   CALCIUM 7.9 (L) 12/31/2021 0137   GFRNONAA >60 12/31/2021 0137   GFRAA >60 02/15/2019 0455    COAG Lab Results  Component Value Date   INR 1.26 12/23/2018   INR 1.17 12/10/2018   INR 0.97 11/28/2018   No results found for: PTT  Antibiotics Anti-infectives (From admission, onward)    Start     Dose/Rate Route Frequency Ordered Stop   12/30/21 2200  vancomycin (VANCOCIN) IVPB 1000 mg/200 mL premix        1,000 mg 200 mL/hr over 60 Minutes Intravenous Every 12 hours 12/30/21 1046      12/29/21 1000  metroNIDAZOLE (FLAGYL) IVPB 500 mg        500 mg 100 mL/hr over 60 Minutes Intravenous Every 12 hours 12/29/21 0802     12/28/21 2200  vancomycin (VANCOREADY) IVPB 1500 mg/300 mL  Status:  Discontinued        1,500 mg 150 mL/hr over 120 Minutes Intravenous Every 12 hours 12/28/21 0931 12/30/21 1046   12/28/21 1400  ceFEPIme (MAXIPIME) 2 g in sodium chloride 0.9 % 100 mL IVPB        2 g 200 mL/hr over 30 Minutes Intravenous Every 8 hours 12/28/21 1310     12/28/21 0930  vancomycin (VANCOREADY) IVPB 2000 mg/400 mL        2,000 mg 200 mL/hr over 120 Minutes Intravenous  Once 12/28/21 0928 12/28/21 1243   12/28/21 0900  cefTRIAXone (ROCEPHIN) 2 g in sodium chloride 0.9 % 100 mL IVPB        2 g 200 mL/hr over 30 Minutes Intravenous  Once 12/28/21 0845 12/28/21 0935   12/28/21 0845  vancomycin (VANCOREADY) IVPB 1500 mg/300 mL  Status:  Discontinued        1,500 mg 150 mL/hr over 120 Minutes Intravenous  Once 12/28/21 0844 12/28/21 1025  Eldridge Abrahams, M.D., [facility] [place] Office: [phone] Pager:  [phone]

## 2022-01-01 ENCOUNTER — Encounter (HOSPITAL_COMMUNITY): Payer: Self-pay | Admitting: Internal Medicine

## 2022-01-01 ENCOUNTER — Inpatient Hospital Stay (HOSPITAL_COMMUNITY): Payer: Medicaid Other | Admitting: Anesthesiology

## 2022-01-01 ENCOUNTER — Other Ambulatory Visit: Payer: Self-pay

## 2022-01-01 ENCOUNTER — Encounter (HOSPITAL_COMMUNITY)
Admission: EM | Disposition: A | Discharge status: Rehabilitation Facility | Payer: Self-pay | Source: Home / Self Care | Attending: Internal Medicine

## 2022-01-01 DIAGNOSIS — I70235 Atherosclerosis of native arteries of right leg with ulceration of other part of foot: Secondary | ICD-10-CM | POA: Diagnosis not present

## 2022-01-01 DIAGNOSIS — L97519 Non-pressure chronic ulcer of other part of right foot with unspecified severity: Secondary | ICD-10-CM | POA: Diagnosis not present

## 2022-01-01 DIAGNOSIS — E11621 Type 2 diabetes mellitus with foot ulcer: Secondary | ICD-10-CM | POA: Diagnosis not present

## 2022-01-01 DIAGNOSIS — Z89431 Acquired absence of right foot: Secondary | ICD-10-CM

## 2022-01-01 DIAGNOSIS — Z48812 Encounter for surgical aftercare following surgery on the circulatory system: Secondary | ICD-10-CM

## 2022-01-01 HISTORY — PX: TRANSMETATARSAL AMPUTATION: SHX6197

## 2022-01-01 HISTORY — PX: ENDARTERECTOMY: SHX5162

## 2022-01-01 LAB — GLUCOSE, CAPILLARY
Glucose-Capillary: 138 mg/dL — ABNORMAL HIGH (ref 70–99)
Glucose-Capillary: 135 mg/dL — ABNORMAL HIGH (ref 70–99)
Glucose-Capillary: 138 mg/dL — ABNORMAL HIGH (ref 70–99)
Glucose-Capillary: 174 mg/dL — ABNORMAL HIGH (ref 70–99)
Glucose-Capillary: 226 mg/dL — ABNORMAL HIGH (ref 70–99)

## 2022-01-01 LAB — TYPE AND SCREEN
ABO/RH(D): A POS
Antibody Screen: NEGATIVE

## 2022-01-01 LAB — POCT ACTIVATED CLOTTING TIME
Activated Clotting Time: 329 seconds
Activated Clotting Time: 263 seconds

## 2022-01-01 LAB — ABO/RH: ABO/RH(D): A POS

## 2022-01-01 SURGERY — ENDARTERECTOMY, ILIAC
Anesthesia: General | Site: Toe | Laterality: Right

## 2022-01-01 MED ORDER — ONDANSETRON HCL 4 MG/2ML IJ SOLN
INTRAMUSCULAR | Status: DC | PRN
Start: 2022-01-01 — End: 2022-01-01
  Administered 2022-01-01: 4 mg via INTRAVENOUS

## 2022-01-01 MED ORDER — HYDRALAZINE HCL 20 MG/ML IJ SOLN
INTRAMUSCULAR | Status: AC
  Filled 2022-01-01: qty 1

## 2022-01-01 MED ORDER — HYDROMORPHONE HCL 1 MG/ML IJ SOLN
1.0000 mg | INTRAMUSCULAR | Status: DC | PRN
  Administered 2022-01-01 – 2022-01-02 (×9): 1 mg via INTRAVENOUS
  Filled 2022-01-01 (×9): qty 1

## 2022-01-01 MED ORDER — PANTOPRAZOLE SODIUM 40 MG PO TBEC
40.0000 mg | DELAYED_RELEASE_TABLET | Freq: Every day | ORAL | Status: DC
  Administered 2022-01-02 – 2022-01-09 (×8): 40 mg via ORAL
  Filled 2022-01-01 (×8): qty 1

## 2022-01-01 MED ORDER — ALUM & MAG HYDROXIDE-SIMETH 200-200-20 MG/5ML PO SUSP: 15.0000 mL | ORAL | Status: DC | PRN

## 2022-01-01 MED ORDER — OXYCODONE HCL 5 MG/5ML PO SOLN
5.0000 mg | Freq: Once | ORAL | Status: AC | PRN
  Administered 2022-01-01: 5 mg via ORAL

## 2022-01-01 MED ORDER — HYDRALAZINE HCL 20 MG/ML IJ SOLN
5.0000 mg | INTRAMUSCULAR | Status: DC | PRN
  Filled 2022-01-01: qty 0.25

## 2022-01-01 MED ORDER — ACETAMINOPHEN 500 MG PO TABS
1000.0000 mg | ORAL_TABLET | Freq: Once | ORAL | Status: AC
  Administered 2022-01-01: 1000 mg via ORAL
  Filled 2022-01-01: qty 2

## 2022-01-01 MED ORDER — PHENYLEPHRINE HCL-NACL 20-0.9 MG/250ML-% IV SOLN
INTRAVENOUS | Status: DC | PRN
  Administered 2022-01-01: 50 ug/min via INTRAVENOUS

## 2022-01-01 MED ORDER — CHLORHEXIDINE GLUCONATE 0.12 % MT SOLN
15.0000 mL | Freq: Once | OROMUCOSAL | Status: AC
  Administered 2022-01-01: 15 mL via OROMUCOSAL

## 2022-01-01 MED ORDER — EPHEDRINE 5 MG/ML INJ
INTRAVENOUS | Status: AC
  Filled 2022-01-01: qty 5

## 2022-01-01 MED ORDER — ROCURONIUM BROMIDE 10 MG/ML (PF) SYRINGE
PREFILLED_SYRINGE | INTRAVENOUS | Status: AC
  Filled 2022-01-01: qty 10

## 2022-01-01 MED ORDER — HEPARIN SODIUM (PORCINE) 1000 UNIT/ML IJ SOLN
INTRAMUSCULAR | Status: DC | PRN
Start: 2022-01-01 — End: 2022-01-01
  Administered 2022-01-01: 10000 [IU] via INTRAVENOUS

## 2022-01-01 MED ORDER — 0.9 % SODIUM CHLORIDE (POUR BTL) OPTIME
TOPICAL | Status: DC | PRN
  Administered 2022-01-01: 2000 mL

## 2022-01-01 MED ORDER — LIDOCAINE 2% (20 MG/ML) 5 ML SYRINGE
INTRAMUSCULAR | Status: AC
  Filled 2022-01-01: qty 5

## 2022-01-01 MED ORDER — PROTAMINE SULFATE 10 MG/ML IV SOLN
INTRAVENOUS | Status: AC
  Filled 2022-01-01: qty 10

## 2022-01-01 MED ORDER — FENTANYL CITRATE (PF) 100 MCG/2ML IJ SOLN
25.0000 ug | INTRAMUSCULAR | Status: DC | PRN
  Administered 2022-01-01 (×2): 25 ug via INTRAVENOUS

## 2022-01-01 MED ORDER — PROPOFOL 10 MG/ML IV BOLUS
INTRAVENOUS | Status: AC
  Filled 2022-01-01: qty 20

## 2022-01-01 MED ORDER — HEPARIN SODIUM (PORCINE) 1000 UNIT/ML IJ SOLN
INTRAMUSCULAR | Status: AC
  Filled 2022-01-01: qty 30

## 2022-01-01 MED ORDER — HEMOSTATIC AGENTS (NO CHARGE) OPTIME
TOPICAL | Status: DC | PRN
  Administered 2022-01-01 (×2): 1 via TOPICAL

## 2022-01-01 MED ORDER — GLYCOPYRROLATE PF 0.2 MG/ML IJ SOSY
PREFILLED_SYRINGE | INTRAMUSCULAR | Status: DC | PRN
  Administered 2022-01-01: .2 mg via INTRAVENOUS

## 2022-01-01 MED ORDER — HEPARIN 6000 UNIT IRRIGATION SOLUTION
Status: DC | PRN
  Administered 2022-01-01: 1

## 2022-01-01 MED ORDER — OXYCODONE HCL 5 MG/5ML PO SOLN
ORAL | Status: AC
  Filled 2022-01-01: qty 5

## 2022-01-01 MED ORDER — FENTANYL CITRATE (PF) 250 MCG/5ML IJ SOLN
INTRAMUSCULAR | Status: DC | PRN
  Administered 2022-01-01: 50 ug via INTRAVENOUS
  Administered 2022-01-01: 100 ug via INTRAVENOUS
  Administered 2022-01-01 (×3): 50 ug via INTRAVENOUS

## 2022-01-01 MED ORDER — ONDANSETRON HCL 4 MG/2ML IJ SOLN: 4.0000 mg | Freq: Four times a day (QID) | INTRAMUSCULAR | Status: DC | PRN

## 2022-01-01 MED ORDER — PHENYLEPHRINE 40 MCG/ML (10ML) SYRINGE FOR IV PUSH (FOR BLOOD PRESSURE SUPPORT)
PREFILLED_SYRINGE | INTRAVENOUS | Status: AC
  Filled 2022-01-01: qty 10

## 2022-01-01 MED ORDER — ORAL CARE MOUTH RINSE: 15.0000 mL | Freq: Once | OROMUCOSAL | Status: AC

## 2022-01-01 MED ORDER — ROCURONIUM BROMIDE 10 MG/ML (PF) SYRINGE
PREFILLED_SYRINGE | INTRAVENOUS | Status: DC | PRN
  Administered 2022-01-01 (×2): 20 mg via INTRAVENOUS
  Administered 2022-01-01: 80 mg via INTRAVENOUS

## 2022-01-01 MED ORDER — SUGAMMADEX SODIUM 200 MG/2ML IV SOLN
INTRAVENOUS | Status: DC | PRN
Start: 2022-01-01 — End: 2022-01-01
  Administered 2022-01-01: 200 mg via INTRAVENOUS

## 2022-01-01 MED ORDER — SODIUM CHLORIDE 0.9 % IV SOLN: 500.0000 mL | Freq: Once | INTRAVENOUS | Status: DC | PRN

## 2022-01-01 MED ORDER — LABETALOL HCL 5 MG/ML IV SOLN
10.0000 mg | INTRAVENOUS | Status: DC | PRN
  Filled 2022-01-01: qty 4

## 2022-01-01 MED ORDER — PHENYLEPHRINE 40 MCG/ML (10ML) SYRINGE FOR IV PUSH (FOR BLOOD PRESSURE SUPPORT)
PREFILLED_SYRINGE | INTRAVENOUS | Status: DC | PRN
  Administered 2022-01-01 (×2): 40 ug via INTRAVENOUS
  Administered 2022-01-01: 80 ug via INTRAVENOUS
  Administered 2022-01-01: 40 ug via INTRAVENOUS
  Administered 2022-01-01: 80 ug via INTRAVENOUS

## 2022-01-01 MED ORDER — METOPROLOL TARTRATE 5 MG/5ML IV SOLN: 2.0000 mg | INTRAVENOUS | Status: DC | PRN

## 2022-01-01 MED ORDER — ASPIRIN EC 81 MG PO TBEC
81.0000 mg | DELAYED_RELEASE_TABLET | Freq: Every day | ORAL | Status: DC
  Administered 2022-01-02 – 2022-01-09 (×8): 81 mg via ORAL
  Filled 2022-01-01 (×8): qty 1

## 2022-01-01 MED ORDER — PROPOFOL 10 MG/ML IV BOLUS
INTRAVENOUS | Status: DC | PRN
Start: 2022-01-01 — End: 2022-01-01
  Administered 2022-01-01: 20 mg via INTRAVENOUS
  Administered 2022-01-01: 120 mg via INTRAVENOUS
  Administered 2022-01-01: 30 mg via INTRAVENOUS
  Administered 2022-01-01: 20 mg via INTRAVENOUS

## 2022-01-01 MED ORDER — LABETALOL HCL 5 MG/ML IV SOLN
INTRAVENOUS | Status: DC | PRN
  Administered 2022-01-01: 5 mg via INTRAVENOUS

## 2022-01-01 MED ORDER — HYDRALAZINE HCL 20 MG/ML IJ SOLN
10.0000 mg | Freq: Once | INTRAMUSCULAR | Status: AC
  Administered 2022-01-01: 10 mg via INTRAVENOUS

## 2022-01-01 MED ORDER — OXYCODONE HCL 5 MG PO TABS: 5.0000 mg | ORAL_TABLET | Freq: Once | ORAL | Status: AC | PRN

## 2022-01-01 MED ORDER — FENTANYL CITRATE (PF) 250 MCG/5ML IJ SOLN
INTRAMUSCULAR | Status: AC
  Filled 2022-01-01: qty 5

## 2022-01-01 MED ORDER — DEXAMETHASONE SODIUM PHOSPHATE 10 MG/ML IJ SOLN
INTRAMUSCULAR | Status: DC | PRN
  Administered 2022-01-01: 5 mg via INTRAVENOUS

## 2022-01-01 MED ORDER — OXYCODONE HCL 5 MG PO TABS
5.0000 mg | ORAL_TABLET | ORAL | Status: DC | PRN
Start: 2022-01-01 — End: 2022-01-02
  Administered 2022-01-01 – 2022-01-02 (×3): 5 mg via ORAL
  Filled 2022-01-01 (×3): qty 1

## 2022-01-01 MED ORDER — LIDOCAINE 2% (20 MG/ML) 5 ML SYRINGE
INTRAMUSCULAR | Status: DC | PRN
  Administered 2022-01-01: 60 mg via INTRAVENOUS

## 2022-01-01 MED ORDER — FENTANYL CITRATE (PF) 100 MCG/2ML IJ SOLN
INTRAMUSCULAR | Status: AC
  Filled 2022-01-01: qty 2

## 2022-01-01 MED ORDER — ONDANSETRON HCL 4 MG/2ML IJ SOLN
INTRAMUSCULAR | Status: AC
  Filled 2022-01-01: qty 2

## 2022-01-01 MED ORDER — SODIUM CHLORIDE 0.9 % IV SOLN: INTRAVENOUS | Status: AC

## 2022-01-01 MED ORDER — LACTATED RINGERS IV SOLN: INTRAVENOUS | Status: DC

## 2022-01-01 MED ORDER — ONDANSETRON HCL 4 MG/2ML IJ SOLN: 4.0000 mg | Freq: Once | INTRAMUSCULAR | Status: DC | PRN

## 2022-01-01 MED ORDER — PROTAMINE SULFATE 10 MG/ML IV SOLN
INTRAVENOUS | Status: DC | PRN
  Administered 2022-01-01: 20 mg via INTRAVENOUS
  Administered 2022-01-01: 30 mg via INTRAVENOUS

## 2022-01-01 MED ORDER — ALBUMIN HUMAN 5 % IV SOLN: INTRAVENOUS | Status: DC | PRN

## 2022-01-01 MED ORDER — EPHEDRINE SULFATE-NACL 50-0.9 MG/10ML-% IV SOSY
PREFILLED_SYRINGE | INTRAVENOUS | Status: DC | PRN
  Administered 2022-01-01 (×2): 5 mg via INTRAVENOUS
  Administered 2022-01-01: 10 mg via INTRAVENOUS
  Administered 2022-01-01: 5 mg via INTRAVENOUS

## 2022-01-01 SURGICAL SUPPLY — 79 items
ADH SKN CLS APL DERMABOND .7 (GAUZE/BANDAGES/DRESSINGS) ×2
AGENT HMST KT MTR STRL THRMB (HEMOSTASIS) ×2
BAG COUNTER SPONGE SURGICOUNT (BAG) ×3 IMPLANT
BAG SPNG CNTER NS LX DISP (BAG) ×2
BANDAGE ESMARK 6X9 LF (GAUZE/BANDAGES/DRESSINGS) IMPLANT
BLADE CLIPPER SURG (BLADE) ×2 IMPLANT
BLADE CORE FAN STRYKER (BLADE) ×1 IMPLANT
BNDG CMPR 9X6 STRL LF SNTH (GAUZE/BANDAGES/DRESSINGS) ×2
BNDG CMPR MED 10X6 ELC LF (GAUZE/BANDAGES/DRESSINGS) ×4
BNDG ELASTIC 4X5.8 VLCR STR LF (GAUZE/BANDAGES/DRESSINGS) IMPLANT
BNDG ELASTIC 6X10 VLCR STRL LF (GAUZE/BANDAGES/DRESSINGS) ×2 IMPLANT
BNDG ESMARK 6X9 LF (GAUZE/BANDAGES/DRESSINGS) ×3
CANISTER SUCT 3000ML PPV (MISCELLANEOUS) ×3 IMPLANT
CLIP LIGATING EXTRA MED SLVR (CLIP) ×3 IMPLANT
CLIP LIGATING EXTRA SM BLUE (MISCELLANEOUS) ×5 IMPLANT
COVER PROBE W GEL 5X96 (DRAPES) ×3 IMPLANT
COVER SURGICAL LIGHT HANDLE (MISCELLANEOUS) ×1 IMPLANT
CUFF TOURN SGL QUICK 18X4 (TOURNIQUET CUFF) IMPLANT
CUFF TOURN SGL QUICK 24 (TOURNIQUET CUFF)
CUFF TOURN SGL QUICK 34 (TOURNIQUET CUFF)
CUFF TOURN SGL QUICK 42 (TOURNIQUET CUFF) IMPLANT
CUFF TRNQT CYL 24X4X16.5-23 (TOURNIQUET CUFF) IMPLANT
CUFF TRNQT CYL 34X4.125X (TOURNIQUET CUFF) IMPLANT
DERMABOND ADVANCED (GAUZE/BANDAGES/DRESSINGS) ×1
DERMABOND ADVANCED .7 DNX12 (GAUZE/BANDAGES/DRESSINGS) ×2 IMPLANT
DRAIN CHANNEL 15F RND FF W/TCR (WOUND CARE) IMPLANT
DRAPE C-ARM 42X72 X-RAY (DRAPES) ×3 IMPLANT
DRAPE HALF SHEET 40X57 (DRAPES) ×1 IMPLANT
DRSG XEROFORM 1X8 (GAUZE/BANDAGES/DRESSINGS) ×1 IMPLANT
ELECT REM PT RETURN 9FT ADLT (ELECTROSURGICAL) ×3
ELECTRODE REM PT RTRN 9FT ADLT (ELECTROSURGICAL) ×2 IMPLANT
EVACUATOR SILICONE 100CC (DRAIN) IMPLANT
GAUZE 4X4 16PLY ~~LOC~~+RFID DBL (SPONGE) ×1 IMPLANT
GAUZE SPONGE 4X4 12PLY STRL LF (GAUZE/BANDAGES/DRESSINGS) ×1 IMPLANT
GLOVE SRG 8 PF TXTR STRL LF DI (GLOVE) ×4 IMPLANT
GLOVE SURG POLYISO LF SZ8 (GLOVE) IMPLANT
GLOVE SURG UNDER POLY LF SZ8 (GLOVE) ×6
GOWN STRL REUS W/ TWL LRG LVL3 (GOWN DISPOSABLE) ×6 IMPLANT
GOWN STRL REUS W/TWL 2XL LVL3 (GOWN DISPOSABLE) ×3 IMPLANT
GOWN STRL REUS W/TWL LRG LVL3 (GOWN DISPOSABLE) ×6
GRAFT VASC PATCH XENOSURE 1X14 (Vascular Products) ×1 IMPLANT
HEMOSTAT SNOW SURGICEL 2X4 (HEMOSTASIS) ×1 IMPLANT
KIT BASIN OR (CUSTOM PROCEDURE TRAY) ×3 IMPLANT
KIT TURNOVER KIT B (KITS) ×3 IMPLANT
NS IRRIG 1000ML POUR BTL (IV SOLUTION) ×6 IMPLANT
PACK PERIPHERAL VASCULAR (CUSTOM PROCEDURE TRAY) ×3 IMPLANT
PAD ARMBOARD 7.5X6 YLW CONV (MISCELLANEOUS) ×6 IMPLANT
PAD CAST 4YDX4 CTTN HI CHSV (CAST SUPPLIES) IMPLANT
PADDING CAST COTTON 4X4 STRL (CAST SUPPLIES) ×3
PADDING CAST COTTON 6X4 STRL (CAST SUPPLIES) ×2 IMPLANT
SET WALTER ACTIVATION W/DRAPE (SET/KITS/TRAYS/PACK) ×1 IMPLANT
SPLINT PLASTER CAST XFAST 5X30 (CAST SUPPLIES) IMPLANT
SPLINT PLASTER XFAST SET 5X30 (CAST SUPPLIES) ×1
SPONGE T-LAP 18X18 ~~LOC~~+RFID (SPONGE) ×3 IMPLANT
STAPLER VISISTAT 35W (STAPLE) ×1 IMPLANT
STOPCOCK 4 WAY LG BORE MALE ST (IV SETS) ×3 IMPLANT
SURGIFLO W/THROMBIN 8M KIT (HEMOSTASIS) ×1 IMPLANT
SUT ETHILON 2 0 PSLX (SUTURE) ×2 IMPLANT
SUT MNCRL AB 4-0 PS2 18 (SUTURE) ×6 IMPLANT
SUT PROLENE 5 0 C 1 24 (SUTURE) ×10 IMPLANT
SUT PROLENE 5 0 C 1 36 (SUTURE) ×2 IMPLANT
SUT PROLENE 6 0 BV (SUTURE) ×4 IMPLANT
SUT PROLENE 7 0 BV 1 (SUTURE) ×1 IMPLANT
SUT SILK 2 0 (SUTURE) ×3
SUT SILK 2 0 PERMA HAND 18 BK (SUTURE) ×1 IMPLANT
SUT SILK 2 0 SH (SUTURE) ×3 IMPLANT
SUT SILK 2-0 18XBRD TIE 12 (SUTURE) IMPLANT
SUT SILK 3 0 (SUTURE) ×3
SUT SILK 3-0 18XBRD TIE 12 (SUTURE) IMPLANT
SUT VIC AB 2-0 CT1 18 (SUTURE) ×1 IMPLANT
SUT VIC AB 2-0 CT1 27 (SUTURE) ×9
SUT VIC AB 2-0 CT1 TAPERPNT 27 (SUTURE) ×4 IMPLANT
SUT VIC AB 3-0 SH 27 (SUTURE) ×6
SUT VIC AB 3-0 SH 27X BRD (SUTURE) ×4 IMPLANT
TOWEL GREEN STERILE (TOWEL DISPOSABLE) ×3 IMPLANT
TRAY FOLEY MTR SLVR 16FR STAT (SET/KITS/TRAYS/PACK) ×3 IMPLANT
TUBING EXTENTION W/L.L. (IV SETS) ×3 IMPLANT
UNDERPAD 30X36 HEAVY ABSORB (UNDERPADS AND DIAPERS) ×3 IMPLANT
WATER STERILE IRR 1000ML POUR (IV SOLUTION) ×3 IMPLANT

## 2022-01-01 NOTE — Transfer of Care (Signed)
Immediate Anesthesia Transfer of Care Note  Patient: Antonio [name].  Procedure(s) Performed: RIGHT ILEOFEMORAL ENDARTERECTOMY with PATCH ANGIOPLASTY (Right) RIGHT TRANSMETATARSAL AMPUTATION (Right: Toe)  Patient Location: PACU  Anesthesia Type:General  Level of Consciousness: drowsy  Airway & Oxygen Therapy: Patient Spontanous Breathing and Patient connected to face mask oxygen  Post-op Assessment: Report given to RN and Post -op Vital signs reviewed and stable  Post vital signs: Reviewed and stable  Last Vitals:  Vitals Value Taken Time  BP 150/63 01/01/22 1331  Temp    Pulse 67 01/01/22 1332  Resp 22 01/01/22 1332  SpO2 92 % 01/01/22 1332  Vitals shown include unvalidated device data.  Last Pain:  Vitals:   01/01/22 0818  TempSrc:   PainSc: 8       Patients Stated Pain Goal: 2 ([id number])  Complications: No notable events documented.

## 2022-01-01 NOTE — Progress Notes (Signed)
Pt returned from PACU, VSS, incisions clean dry and intact.    Chrisandra Carota, RN 01/01/2022 3:40 PM

## 2022-01-01 NOTE — Anesthesia Procedure Notes (Signed)
Arterial Line Insertion Start/End2/04/2022 8:40 AM, 01/01/2022 8:55 AM Performed by: Adair Laundry, CRNA, CRNA  Patient location: Pre-op. Preanesthetic checklist: patient identified, IV checked, surgical consent and monitors and equipment checked Lidocaine 1% used for infiltration Right, radial was placed Catheter size: 20 G Hand hygiene performed  and maximum sterile barriers used   Attempts: 1 Procedure performed without using ultrasound guided technique. Following insertion, dressing applied and Biopatch. Post procedure assessment: normal  Patient tolerated the procedure well with no immediate complications.

## 2022-01-01 NOTE — Op Note (Addendum)
NAME: Antonio [name].    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 is a 66 y.o. male with right lower extremity Rutherford 5 critical limb ischemia.  Diagnostic angiography demonstrated single-vessel inline flow to the foot with significant microvascular disease from poorly controlled diabetes-last A1c 11.3.  I offered Antonio [name] primary amputation versus right lower extremity revascularization with TMA.  He would benefit most from right-sided iliofemoral endarterectomy.  I am concerned that even with lower extremity revascularization, his poorly controlled diabetes will make healing any wound difficult.  His microvascular damage in the foot is not reversible. I quoted him a 50% chance of requiring a higher level of amputation.  Antonio [name] would like to do everything he can to keep his right leg, as he maintains independence being able to stand, pivot and drive.   FINDINGS:   95% stenosis of the right distal external iliac artery 90% stenosis of the right common femoral artery Healthy bleeding appreciated from the transmetatarsal amputation site status post revascularization  TECHNIQUE:   Patient was brought to the OR laid in supine position.  General anesthesia was induced and the patient was prepped and draped in standard fashion.  A timeout was performed. Case began with ultrasound insonation of the right common femoral artery.  The common femoral bifurcation was marked and an oblique incision was made in an effort to stay out of the patient's groin crease.  The patient had been treated for a number of days with  miconazole and the fungal infection present in the groin crease and had improved dramatically.  Fortunately, with an oblique incision, I was able to stay out of this region.  The common femoral artery was exposed in standard fashion.  There was severe atherosclerotic disease appreciated, a small portion of the inguinal ligament was released to allow for access in the retroperitoneum for control of the external iliac artery.  Vesseloops were used to control the external iliac artery, superficial femoral artery, profunda.  When passing the right inguinal behind the distal external iliac artery, a vein branch was avulsed.  This was repaired with the use of 5-0 Prolene suture.  The common iliac vein was not narrowed.  Next, the patient was given 10,000 units IV heparin, arteries clamped and a longitudinal arteriotomy made along the common femoral artery into the external iliac artery.  Iliofemoral endarterectomy followed, with resolution of 2 areas of tandem stenoses.  A bovine pericardial patch was brought to the field and sewn in using running 5-0 Prolene suture.  Prior to completion, the arteries were bled to ensure there was no debris or thrombus.  On completion there was an excellent pulse in the common femoral artery with multiphasic signal in the foot.  The right groin site was irrigated with saline and closed in layers using Vicryl suture.  Monocryl and Dermabond were used at the level of the skin.  Next, my attention turned to the foot.  A transverse incision was made across the dorsal aspect of the patient's right forefoot and carried down to the metatarsal tarsal bones the incision was extended to the plantar region on  both medial and lateral aspects of the incision was then extended distally parallel to the plantar surface in an effort to keep as much plantar tissue for the transmetatarsal flap as possible next, the soft tissue was incised using sharp debridement once the tarsal bones were isolated  Stryker saw was used to remove the metatarsals associated with the second and third fourth and fifth toes, as well as the necrotic, fifth metatarsal ulceration.  Next, the remaining forefoot and flap were irrigated debrided and hemostasis achieved with the use of suture and cautery the flap was then approximated using Vicryl suture internally and vertical mattress nylon staples at level of the skin.  Bleeding was appreciated in the wound bed, which was encouraging.  Xeroform was placed over the suture line followed by 4 x 4 fluffs and web roll.  Plaster was used to make a splint to keep the foot immobile.  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  Dr. Molli Posey involvement was paramount, for exposure and primary venous repair.  Ladonna Snide, MD [facility]  DATE OF DICTATION:   01/01/2022

## 2022-01-01 NOTE — Progress Notes (Signed)
° °  Subjective: I have seen and evaluated Antonio [name] at the PACU bedside. He still appeared to be drowsy from anesthesia. The nurse stated he has been quiet but his vitals are stable. He did not appear to be in any acute distress.  Objective:  Vital signs in last 24 hours: Vitals:   01/01/22 0827 01/01/22 0930 01/01/22 1330 01/01/22 1345  BP: (!) 223/88 (!) 163/88 (!) 150/63 136/66  Pulse: 63  66 70  Resp: 17  (!) 8 18  Temp:   97.9 F (36.6 C)   TempSrc:      SpO2: 94%  94% 95%  Weight:      Height:       Physical Exam Constitutional:      General: He is awake.  Cardiovascular:     Rate and Rhythm: Normal rate.     Heart sounds: Normal heart sounds.  Pulmonary:     Effort: Pulmonary effort is normal.     Breath sounds: Normal air entry.  Abdominal:     Palpations: Abdomen is soft.  Musculoskeletal:     Comments: Right leg neatly bandaged.      Right Lower Extremity: Right leg is amputated below knee.  Skin:    General: Skin is warm and dry.  Neurological:     Comments: Drowsy     Assessment/Plan:  Principal Problem:   Osteomyelitis (HCC) Active Problems:   Type 2 diabetes mellitus with hyperglycemia, with long-term current use of insulin (HCC)   Hypertension   PVD (peripheral vascular disease) (HCC)   Preoperative cardiovascular examination    Antonio Carvalho. is a 66 y.o. with a history of poorly controlled hypertension, type 2 diabetes, PAD status post left BKA in 2019 and tobacco use, who presented with 1 month of worsening right foot ulcer with acute leg pain at rest and admitted for further work-up and evaluation for osteomyelitis of the right fifth metatarsophalangeal joint.   Osteomyelitis of the right fifth metatarsal Pending echocardiogram showed no significant changes from previous; EF 60 to 65%, no valvulopathy and normal function of left ventricle.  Patient cleared for surgery. Patient underwent vascular procedure along with amputation today.   Source control via surgery; further antibiotic treatment not indicated. --Pain control: Dilaudid and oxycodone  Hypertension Current regimen includes amlodipine 10 mg; carvedilol 12.5 mg and hydralazine 10 mg 3 times daily. Patient will benefit from an ARB; creatinine levels are within reference range. Pt hs post op, consider adding ARB. --Continue current regimen; consider adding an ARB following surgical procedure   Type 2 diabetes with complications Current A1c 11.3%. Current dosing Semglee 22 units at bedtime. Monitor glucose readings post op and consider adjusting as needed. --Semglee 22 units at bedtime --SSI resistance scale -- Continue glucose monitoring  Prior to Admission Living Arrangement: Anticipated Discharge Location: Barriers to Discharge: Dispo: Anticipated discharge in approximately 1-2 day(s).   Dellis Filbert, MD 01/01/2022, 1:56 PM Pager: [phone] After 5pm on weekdays and 1pm on weekends: On Call pager [phone]

## 2022-01-01 NOTE — Anesthesia Preprocedure Evaluation (Addendum)
Anesthesia Evaluation  Patient identified by MRN, date of birth, ID band Patient awake    Reviewed: Allergy & Precautions, NPO status , Patient's Chart, lab work & pertinent test results  History of Anesthesia Complications Negative for: history of anesthetic complications  Airway Mallampati: II  TM Distance: >3 FB Neck ROM: Full    Dental  (+) Dental Advisory Given   Pulmonary Current Smoker and Patient abstained from smoking.,   Hx tracheostomy in 2020, decannulated after 2-3 months prior to hospital discharge per patient recollection    Pulmonary exam normal        Cardiovascular hypertension (uncontrolled), Pt. on medications + CAD, + Cardiac Stents and + Peripheral Vascular Disease  Normal cardiovascular exam   '23 TTE - Trivial pericardial effusion, normal EF, no valvulopathy    Neuro/Psych negative neurological ROS  negative psych ROS   GI/Hepatic negative GI ROS, Neg liver ROS,   Endo/Other  diabetes, Poorly Controlled, Insulin Dependent  Renal/GU negative Renal ROS     Musculoskeletal negative musculoskeletal ROS (+)   Abdominal   Peds  Hematology negative hematology ROS (+)   Anesthesia Other Findings Noncompliance, denies taking any medications aside from ASA prior to hospitalization  Reproductive/Obstetrics                            Anesthesia Physical Anesthesia Plan  ASA: 4  Anesthesia Plan: General   Post-op Pain Management: Tylenol PO (pre-op)   Induction: Intravenous  PONV Risk Score and Plan: 2 and Treatment may vary due to age or medical condition, Ondansetron, Midazolam, Dexamethasone and Scopolamine patch - Pre-op  Airway Management Planned: Oral ETT  Additional Equipment: Arterial line  Intra-op Plan:   Post-operative Plan: Extubation in OR  Informed Consent: I have reviewed the patients History and Physical, chart, labs and discussed the procedure  including the risks, benefits and alternatives for the proposed anesthesia with the patient or authorized representative who has indicated his/her understanding and acceptance.     Dental advisory given  Plan Discussed with: CRNA and Anesthesiologist  Anesthesia Plan Comments:        Anesthesia Quick Evaluation

## 2022-01-01 NOTE — Progress Notes (Addendum)
Subjective  -   No complaints today   Physical Exam:  Right foot dressing is intact with some drainage. Necrotic. Groin slightly improved with fungal infection. Groin cannulation site soft   Assessment/Plan:    Right foot ulcer: I discussed with the patient that we will proceed with right femoral endarterectomy with patch angioplasty and transmetatarsal foot amputation.  He understands that he may require subsequent procedures including angiography and stenting, based off of evaluation of the blood flow at the level of his amputation.  He will be n.p.o. after midnight.  Echo cardiogram is pending.  Cardiology has stated we can proceed with surgery as his ejection fraction is preserved.  I had a long discussion with Laken regarding his right lower extremity Rutherford 5 critical limb ischemia.  Diagnostic angiography demonstrated single-vessel inline flow to the foot with significant microvascular disease from poorly controlled diabetes-last A1c 11.3.  I offered [facility] primary amputation versus right lower extremity revascularization with TMA.  He would benefit most from right-sided iliofemoral endarterectomy.  I am concerned that even with lower extremity revascularization, his poorly controlled diabetes will make healing any wound difficult.  His microvascular damage in the foot is not reversible. I quoted him a 50% chance of requiring a higher level of amputation.  Edsell would like to do everything he can to keep his right leg, as he maintains independence being able to stand, pivot and drive.   After discussing the risks and benefits of the above, Powell elected to proceed.   Antonio [name] 01/01/2022 8:31 AM --  Vitals:   01/01/22 0411 01/01/22 0735  BP: (!) 166/84 (!) 199/88  Pulse: 60 60  Resp: 15 (!) 58  Temp: 97.8 F (36.6 C) (!) 97.5 F (36.4 C)  SpO2: 95% 94%    Intake/Output Summary (Last 24 hours) at 01/01/2022 0831 Last data filed at 01/01/2022 0522 Gross per 24  hour  Intake 300 ml  Output 1050 ml  Net -750 ml      Laboratory CBC    Component Value Date/Time   WBC 8.9 12/31/2021 0137   HGB 14.9 12/31/2021 0137   HCT 44.8 12/31/2021 0137   PLT 218 12/31/2021 0137    BMET    Component Value Date/Time   NA 136 12/31/2021 0137   K 4.6 12/31/2021 0137   CL 104 12/31/2021 0137   CO2 21 (L) 12/31/2021 0137   GLUCOSE 231 (H) 12/31/2021 0137   BUN 36 (H) 12/31/2021 0137   CREATININE 1.17 12/31/2021 0137   CALCIUM 7.9 (L) 12/31/2021 0137   GFRNONAA >60 12/31/2021 0137   GFRAA >60 02/15/2019 0455    COAG Lab Results  Component Value Date   INR 1.26 12/23/2018   INR 1.17 12/10/2018   INR 0.97 11/28/2018   No results found for: PTT  Antibiotics Anti-infectives (From admission, onward)    Start     Dose/Rate Route Frequency Ordered Stop   12/30/21 2200  [MAR Hold]  vancomycin (VANCOCIN) IVPB 1000 mg/200 mL premix        (MAR Hold since Mon 01/01/2022 at 0727.Hold Reason: Transfer to a Procedural area)   1,000 mg 200 mL/hr over 60 Minutes Intravenous Every 12 hours 12/30/21 1046     12/29/21 1000  [MAR Hold]  metroNIDAZOLE (FLAGYL) IVPB 500 mg        (MAR Hold since Mon 01/01/2022 at 0727.Hold Reason: Transfer to a Procedural area)   500 mg 100 mL/hr over 60 Minutes Intravenous Every 12  hours 12/29/21 0802     12/28/21 2200  vancomycin (VANCOREADY) IVPB 1500 mg/300 mL  Status:  Discontinued        1,500 mg 150 mL/hr over 120 Minutes Intravenous Every 12 hours 12/28/21 0931 12/30/21 1046   12/28/21 1400  [MAR Hold]  ceFEPIme (MAXIPIME) 2 g in sodium chloride 0.9 % 100 mL IVPB        (MAR Hold since Mon 01/01/2022 at 0727.Hold Reason: Transfer to a Procedural area)   2 g 200 mL/hr over 30 Minutes Intravenous Every 8 hours 12/28/21 1310     12/28/21 0930  vancomycin (VANCOREADY) IVPB 2000 mg/400 mL        2,000 mg 200 mL/hr over 120 Minutes Intravenous  Once 12/28/21 0928 12/28/21 1243   12/28/21 0900  cefTRIAXone (ROCEPHIN) 2 g in  sodium chloride 0.9 % 100 mL IVPB        2 g 200 mL/hr over 30 Minutes Intravenous  Once 12/28/21 0845 12/28/21 0935   12/28/21 0845  vancomycin (VANCOREADY) IVPB 1500 mg/300 mL  Status:  Discontinued        1,500 mg 150 mL/hr over 120 Minutes Intravenous  Once 12/28/21 0844 12/28/21 0928        Antonio John MD [facility] [place] Office: [phone] Pager:  [phone]

## 2022-01-01 NOTE — Anesthesia Procedure Notes (Signed)
Procedure Name: Intubation Date/Time: 01/01/2022 9:45 AM Performed by: Vonna Drafts, CRNA Pre-anesthesia Checklist: Patient identified, Emergency Drugs available, Suction available and Patient being monitored Patient Re-evaluated:Patient Re-evaluated prior to induction Oxygen Delivery Method: Circle system utilized Preoxygenation: Pre-oxygenation with 100% oxygen Induction Type: IV induction Ventilation: Oral airway inserted - appropriate to patient size Laryngoscope Size: Glidescope and 4 Grade View: Grade I Tube type: Oral Tube size: 7.5 mm Number of attempts: 1 Airway Equipment and Method: Stylet and Oral airway Placement Confirmation: ETT inserted through vocal cords under direct vision, positive ETCO2 and breath sounds checked- equal and bilateral Secured at: 23 cm Tube secured with: Tape Dental Injury: Teeth and Oropharynx as per pre-operative assessment  Difficulty Due To: Difficult Airway- due to reduced neck mobility

## 2022-01-02 ENCOUNTER — Encounter (HOSPITAL_COMMUNITY): Payer: Self-pay | Admitting: Vascular Surgery

## 2022-01-02 DIAGNOSIS — G8918 Other acute postprocedural pain: Secondary | ICD-10-CM

## 2022-01-02 DIAGNOSIS — Z89431 Acquired absence of right foot: Secondary | ICD-10-CM

## 2022-01-02 DIAGNOSIS — Z48812 Encounter for surgical aftercare following surgery on the circulatory system: Secondary | ICD-10-CM

## 2022-01-02 LAB — CULTURE, BLOOD (ROUTINE X 2)
Culture: NO GROWTH
Culture: NO GROWTH
Special Requests: ADEQUATE

## 2022-01-02 LAB — BASIC METABOLIC PANEL
Anion gap: 6 (ref 5–15)
BUN: 32 mg/dL — ABNORMAL HIGH (ref 8–23)
CO2: 25 mmol/L (ref 22–32)
Calcium: 8.1 mg/dL — ABNORMAL LOW (ref 8.9–10.3)
Chloride: 105 mmol/L (ref 98–111)
Creatinine, Ser: 0.9 mg/dL (ref 0.61–1.24)
GFR, Estimated: >60 mL/min (ref >60)
Glucose, Bld: 268 mg/dL — ABNORMAL HIGH (ref 70–99)
Potassium: 4.5 mmol/L (ref 3.5–5.1)
Sodium: 136 mmol/L (ref 135–145)

## 2022-01-02 LAB — CBC WITH DIFFERENTIAL/PLATELET
Abs Immature Granulocytes: 0.06 10*3/uL (ref 0.00–0.07)
Basophils Absolute: 0 10*3/uL (ref 0.0–0.1)
Basophils Relative: 0 %
Eosinophils Absolute: 0 10*3/uL (ref 0.0–0.5)
Eosinophils Relative: 0 %
HCT: 39 % (ref 39.0–52.0)
Hemoglobin: 12.6 g/dL — ABNORMAL LOW (ref 13.0–17.0)
Immature Granulocytes: 1 %
Lymphocytes Relative: 6 %
Lymphs Abs: 0.7 10*3/uL (ref 0.7–4.0)
MCH: 28.6 pg (ref 26.0–34.0)
MCHC: 32.3 g/dL (ref 30.0–36.0)
MCV: 88.4 fL (ref 80.0–100.0)
Monocytes Absolute: 0.5 10*3/uL (ref 0.1–1.0)
Monocytes Relative: 4 %
Neutro Abs: 9.5 10*3/uL — ABNORMAL HIGH (ref 1.7–7.7)
Neutrophils Relative %: 89 %
Platelets: 202 10*3/uL (ref 150–400)
RBC: 4.41 MIL/uL (ref 4.22–5.81)
RDW: 12.6 % (ref 11.5–15.5)
WBC: 10.7 10*3/uL — ABNORMAL HIGH (ref 4.0–10.5)
nRBC: 0 % (ref 0.0–0.2)

## 2022-01-02 LAB — GLUCOSE, CAPILLARY
Glucose-Capillary: 237 mg/dL — ABNORMAL HIGH (ref 70–99)
Glucose-Capillary: 163 mg/dL — ABNORMAL HIGH (ref 70–99)
Glucose-Capillary: 106 mg/dL — ABNORMAL HIGH (ref 70–99)
Glucose-Capillary: 130 mg/dL — ABNORMAL HIGH (ref 70–99)

## 2022-01-02 MED ORDER — OXYCODONE HCL 5 MG PO TABS
10.0000 mg | ORAL_TABLET | ORAL | Status: DC | PRN
  Administered 2022-01-02 – 2022-01-03 (×4): 10 mg via ORAL
  Filled 2022-01-02 (×4): qty 2

## 2022-01-02 MED ORDER — CARVEDILOL 25 MG PO TABS
25.0000 mg | ORAL_TABLET | Freq: Two times a day (BID) | ORAL | Status: DC
  Administered 2022-01-02 – 2022-01-09 (×14): 25 mg via ORAL
  Filled 2022-01-02 (×14): qty 1

## 2022-01-02 MED ORDER — INSULIN GLARGINE-YFGN 100 UNIT/ML ~~LOC~~ SOLN
24.0000 [IU] | Freq: Every day | SUBCUTANEOUS | Status: DC
  Administered 2022-01-02 – 2022-01-08 (×7): 24 [IU] via SUBCUTANEOUS
  Filled 2022-01-02 (×8): qty 0.24

## 2022-01-02 MED FILL — Lidocaine HCl Local Preservative Free (PF) Inj 1%: INTRAMUSCULAR | Qty: 30 | Status: AC

## 2022-01-02 NOTE — Progress Notes (Signed)
Educated pt how to use IS, the frequency, and the benefits. Pt verbalized understanding and demonstrated it right.   Lavenia Atlas, RN

## 2022-01-02 NOTE — Evaluation (Signed)
Physical Therapy Evaluation Patient Details Name: Antonio [name]. MRN: [id number] DOB: 1956-03-29 Today's Date: 01/02/2022  History of Present Illness  66 yo male presenting to ED on 2/2 with nonhealing R foot ulcer. s/p R iliofemoral endarterectomy and TMA on 2/6. PMH including HTN, diabetes, PAD s/p LSFA stenting, s/p left BKA (2019), and tobacco use disorder.  Clinical Impression  PTA, pt lives with his daughter and mother and was independent, using L prosthetic for mobility. Pt presents with decreased functional mobility secondary to weightbearing restrictions, pain, weakness. Pt requiring two person moderate assist for lateral scoot from bed to chair. Would benefit from trialing slide board transfer next session. Recommend post acute rehab to address deficits and maximize functional mobility.     Recommendations for follow up therapy are one component of a multi-disciplinary discharge planning process, led by the attending physician.  Recommendations may be updated based on patient status, additional functional criteria and insurance authorization.  Follow Up Recommendations Acute inpatient rehab (3hours/day)    Assistance Recommended at Discharge PRN  Patient can return home with the following  A lot of help with walking and/or transfers;A lot of help with bathing/dressing/bathroom;Assist for transportation;Help with stairs or ramp for entrance    Equipment Recommendations Other (comment) (slide board, drop arm BSC)  Recommendations for Other Services       Functional Status Assessment Patient has had a recent decline in their functional status and demonstrates the ability to make significant improvements in function in a reasonable and predictable amount of time.     Precautions / Restrictions Precautions Precautions: Fall Restrictions Weight Bearing Restrictions: Yes RLE Weight Bearing: Non weight bearing Other Position/Activity Restrictions: Per vascular sx note       Mobility  Bed Mobility               General bed mobility comments: Sitting at EOB    Transfers Overall transfer level: Needs assistance   Transfers: Bed to chair/wheelchair/BSC            Lateral/Scoot Transfers: Mod assist, +2 physical assistance, +2 safety/equipment General transfer comment: Mod A for guiding hips towards recliner (drop arm) and maintaining NWB status at RLE. Poor safety awareness    Ambulation/Gait                  Stairs            Wheelchair Mobility    Modified Rankin (Stroke Patients Only)       Balance Overall balance assessment: Needs assistance Sitting-balance support: No upper extremity supported, Feet supported Sitting balance-Leahy Scale: Good                                       Pertinent Vitals/Pain Pain Assessment Pain Assessment: Faces Faces Pain Scale: Hurts even more Pain Location: RLE Pain Descriptors / Indicators: Discomfort Pain Intervention(s): Monitored during session    Home Living Family/patient expects to be discharged to:: Private residence Living Arrangements: Children;Parent (Daughter) Available Help at Discharge: Available PRN/intermittently (Daughter works) Type of Home: House Home Access: Level entry         Home Equipment: Shower seat;Electric scooter;Hand held Stage manager (4 wheels);Wheelchair - power Additional Comments: Lives in the baseline of mother's home    Prior Function Prior Level of Function : Independent/Modified Independent  Hand Dominance   Dominant Hand: Right    Extremity/Trunk Assessment   Upper Extremity Assessment Upper Extremity Assessment: Overall WFL for tasks assessed    Lower Extremity Assessment Lower Extremity Assessment: RLE deficits/detail;LLE deficits/detail RLE Deficits / Details: s/p TMA. Hip/knee WFL LLE Deficits / Details: BKA (baseline), knee extension lacking ~5-10 degrees from  neutral    Cervical / Trunk Assessment Cervical / Trunk Assessment: Normal  Communication   Communication: No difficulties  Cognition Arousal/Alertness: Awake/alert Behavior During Therapy: WFL for tasks assessed/performed Overall Cognitive Status: Within Functional Limits for tasks assessed                                          General Comments      Exercises     Assessment/Plan    PT Assessment Patient needs continued PT services  PT Problem List Decreased strength;Decreased activity tolerance;Decreased balance;Decreased mobility       PT Treatment Interventions Functional mobility training;Therapeutic activities;Therapeutic exercise;Balance training;DME instruction;Patient/family education;Wheelchair mobility training    PT Goals (Current goals can be found in the Care Plan section)  Acute Rehab PT Goals Patient Stated Goal: go home PT Goal Formulation: With patient Time For Goal Achievement: 01/16/22 Potential to Achieve Goals: Good    Frequency Min 3X/week     Co-evaluation PT/OT/SLP Co-Evaluation/Treatment: Yes Reason for Co-Treatment: For patient/therapist safety;To address functional/ADL transfers PT goals addressed during session: Mobility/safety with mobility OT goals addressed during session: ADL's and self-care       AM-PAC PT "6 Clicks" Mobility  Outcome Measure Help needed turning from your back to your side while in a flat bed without using bedrails?: A Little Help needed moving from lying on your back to sitting on the side of a flat bed without using bedrails?: A Little Help needed moving to and from a bed to a chair (including a wheelchair)?: A Lot Help needed standing up from a chair using your arms (e.g., wheelchair or bedside chair)?: Total Help needed to walk in hospital room?: Total Help needed climbing 3-5 steps with a railing? : Total 6 Click Score: 11    End of Session Equipment Utilized During Treatment: Gait  belt Activity Tolerance: Patient tolerated treatment well Patient left: in chair;with call bell/phone within reach;with chair alarm set Nurse Communication: Mobility status PT Visit Diagnosis: Other abnormalities of gait and mobility (R26.89)    Time: 1425-1443 PT Time Calculation (min) (ACUTE ONLY): 18 min   Charges:   PT Evaluation $PT Eval Moderate Complexity: 1 Mod          Lillia Pauls, PT, DPT Acute Rehabilitation Services Pager [phone] Office [phone]   Norval Morton 01/02/2022, 5:05 PM

## 2022-01-02 NOTE — Anesthesia Postprocedure Evaluation (Signed)
Anesthesia Post Note  Patient: Antonio [name].  Procedure(s) Performed: RIGHT ILEOFEMORAL ENDARTERECTOMY with PATCH ANGIOPLASTY (Right) RIGHT TRANSMETATARSAL AMPUTATION (Right: Toe)     Patient location during evaluation: PACU Anesthesia Type: General Level of consciousness: awake and alert Pain management: pain level controlled Vital Signs Assessment: post-procedure vital signs reviewed and stable Respiratory status: spontaneous breathing, nonlabored ventilation, respiratory function stable and patient connected to nasal cannula oxygen Cardiovascular status: blood pressure returned to baseline and stable Postop Assessment: no apparent nausea or vomiting Anesthetic complications: no   No notable events documented.  Last Vitals:  Vitals:   01/02/22 0354 01/02/22 0735  BP: (!) 157/68 (!) 165/70  Pulse: 81 88  Resp: 17 18  Temp: 36.4 C 36.4 C  SpO2: 90% 93%    Last Pain:  Vitals:   01/02/22 0840  TempSrc:   PainSc: 7                  Beryle Lathe

## 2022-01-02 NOTE — Progress Notes (Signed)
Inpatient Diabetes Program Recommendations  AACE/ADA: New Consensus Statement on Inpatient Glycemic Control (2015)  Target Ranges:  Prepandial:   less than 140 mg/dL      Peak postprandial:   less than 180 mg/dL (1-2 hours)      Critically ill patients:  140 - 180 mg/dL   Lab Results  Component Value Date   GLUCAP 237 (H) 01/02/2022   HGBA1C 11.3 (H) 12/28/2021    Review of Glycemic Control  Latest Reference Range & Units 01/01/22 06:42 01/01/22 10:58 01/01/22 13:31 01/01/22 18:00 01/01/22 21:25 01/02/22 06:11  Glucose-Capillary 70 - 99 mg/dL 623 (H) 762 (H) 831 (H) 174 (H) 226 (H) 237 (H)  (H): Data is abnormally high  Diabetes history: DM2 Outpatient Diabetes medications: Levemir 15 units BID, Novolog 0-20 units TID with meals (per home med list, not taking Levemir or Novolog) Current orders for Inpatient glycemic control: Semglee 22 units QHS, Novolog 0-15 units TID with  meals  Inpatient Diabetes Program Recommendations:   Please consider: -Increase Semglee to 30 units qd  Thank you, Billy Fischer. Bralen Wiltgen, RN, MSN, CDE  Diabetes Coordinator Inpatient Glycemic Control Team Team Pager [phone] (8am-5pm) 01/02/2022 10:13 AM

## 2022-01-02 NOTE — Progress Notes (Addendum)
°  Progress Note    01/02/2022 7:34 AM 1 Day Post-Op  Subjective:  pain in R foot and groin   Vitals:   01/01/22 2316 01/02/22 0354  BP:  (!) 157/68  Pulse:  81  Resp:  17  Temp: 97.6 F (36.4 C) 97.6 F (36.4 C)  SpO2: 94% 90%   Physical Exam: Lungs:  non labored Incisions:  R groin incision c/d/i Extremities:  dressing left in place on TMA Abdomen:  soft, NT, ND Neurologic: A&O  CBC    Component Value Date/Time   WBC 10.7 (H) 01/02/2022 0400   RBC 4.41 01/02/2022 0400   HGB 12.6 (L) 01/02/2022 0400   HCT 39.0 01/02/2022 0400   PLT 202 01/02/2022 0400   MCV 88.4 01/02/2022 0400   MCH 28.6 01/02/2022 0400   MCHC 32.3 01/02/2022 0400   RDW 12.6 01/02/2022 0400   LYMPHSABS 0.7 01/02/2022 0400   MONOABS 0.5 01/02/2022 0400   EOSABS 0.0 01/02/2022 0400   BASOSABS 0.0 01/02/2022 0400    BMET    Component Value Date/Time   NA 136 01/02/2022 0400   K 4.5 01/02/2022 0400   CL 105 01/02/2022 0400   CO2 25 01/02/2022 0400   GLUCOSE 268 (H) 01/02/2022 0400   BUN 32 (H) 01/02/2022 0400   CREATININE 0.90 01/02/2022 0400   CALCIUM 8.1 (L) 01/02/2022 0400   GFRNONAA >60 01/02/2022 0400   GFRAA >60 02/15/2019 0455    INR    Component Value Date/Time   INR 1.26 12/23/2018 0531     Intake/Output Summary (Last 24 hours) at 01/02/2022 0734 Last data filed at 01/02/2022 0356 Gross per 24 hour  Intake 1632.6 ml  Output 1750 ml  Net -117.4 ml     Assessment/Plan:  66 y.o. male is s/p R iliofemoral endarterectomy and TMA 1 Day Post-Op   Dressing left in place on TMA but palpable popliteal pulse Non weightbearing RLE Dressing stays for one week   Emilie Rutter, PA-C [facility] [phone] 01/02/2022 7:34 AM  VASCULAR STAFF ADDENDUM: I have independently interviewed and examined the patient. I agree with the above.  Elevate leg as able Concerned for wound healing with pts poor diabetes control.  Needs endocrine nurse/ diabetes  educator Dressing stays for one week   J. Gillis Santa, MD [facility] Phone Number: [phone] 01/02/2022 8:30 AM

## 2022-01-02 NOTE — Evaluation (Signed)
Occupational Therapy Evaluation Patient Details Name: Antonio [name]. MRN: [id number] DOB: 01-18-1956 Today's Date: 01/02/2022   History of Present Illness 66 yo male presenting to ED on 2/2 with nonhealing R foot ulcer. s/p R iliofemoral endarterectomy and TMA on 2/6. PMH including HTN, diabetes, PAD s/p LSFA stenting, s/p left BKA (2019), and tobacco use disorder.   Clinical Impression   PTA, pt was living with his daughter and mother and was performing BADLs; using L prosthetic for mobility.  Pt currently requiring Min Guard-Mod A for LB ADLs and Mod A +2 for lateral scoot. Pt requiring physical A for maintaining WB status at RLE. Pt will require further acute OT to facilitate safe dc. Recommend dc to AIR for further OT to optimize safety, independence with ADLs, and return to PLOF.      Recommendations for follow up therapy are one component of a multi-disciplinary discharge planning process, led by the attending physician.  Recommendations may be updated based on patient status, additional functional criteria and insurance authorization.   Follow Up Recommendations  Acute inpatient rehab (3hours/day)    Assistance Recommended at Discharge Frequent or constant Supervision/Assistance  Patient can return home with the following A lot of help with walking and/or transfers;A lot of help with bathing/dressing/bathroom;Assistance with cooking/housework    Functional Status Assessment  Patient has had a recent decline in their functional status and demonstrates the ability to make significant improvements in function in a reasonable and predictable amount of time.  Equipment Recommendations  Other (comment) (Sliding board)    Recommendations for Other Services PT consult     Precautions / Restrictions Precautions Precautions: Fall Restrictions Weight Bearing Restrictions: Yes RLE Weight Bearing: Non weight bearing Other Position/Activity Restrictions: Per vascular sx note       Mobility Bed Mobility               General bed mobility comments: Sitting at EOB    Transfers Overall transfer level: Needs assistance   Transfers: Bed to chair/wheelchair/BSC            Lateral/Scoot Transfers: Mod assist, +2 physical assistance, +2 safety/equipment General transfer comment: Mod A for guiding hips towards recliner (drop arm) and maintaining NWB status at RLE. Poor safety awareness      Balance Overall balance assessment: Needs assistance Sitting-balance support: No upper extremity supported, Feet supported Sitting balance-Leahy Scale: Good                                     ADL either performed or assessed with clinical judgement   ADL Overall ADL's : Needs assistance/impaired Eating/Feeding: Set up;Sitting   Grooming: Set up;Sitting   Upper Body Bathing: Set up;Supervision/ safety;Sitting   Lower Body Bathing: Moderate assistance;+2 for physical assistance;+2 for safety/equipment;Sitting/lateral leans   Upper Body Dressing : Set up;Supervision/safety;Sitting   Lower Body Dressing: +2 for safety/equipment;Min guard;Sitting/lateral leans Lower Body Dressing Details (indicate cue type and reason): Pt donning his prothetic while sitting at EOB. Toilet Transfer: Moderate assistance;+2 for physical assistance;+2 for safety/equipment;Transfer board;Requires drop arm Toilet Transfer Details (indicate cue type and reason): Lateral scoot to recliner with Mod A +2 with prothetic donned on LLE. Poor safety awareness and sequencing         Functional mobility during ADLs: Moderate assistance;+2 for physical assistance;+2 for safety/equipment (lateral scoot only) General ADL Comments: Pt sitting at EOB upon arrival having received pain medication. Pt  performing lateral scoot to recliner     Vision         Perception     Praxis      Pertinent Vitals/Pain Pain Assessment Pain Assessment: Faces Faces Pain Scale: Hurts even  more Pain Location: RLE Pain Descriptors / Indicators: Discomfort Pain Intervention(s): Monitored during session, Limited activity within patient's tolerance, Repositioned     Hand Dominance Right   Extremity/Trunk Assessment Upper Extremity Assessment Upper Extremity Assessment: Overall WFL for tasks assessed   Lower Extremity Assessment Lower Extremity Assessment: Defer to PT evaluation   Cervical / Trunk Assessment Cervical / Trunk Assessment: Normal   Communication     Cognition Arousal/Alertness: Awake/alert Behavior During Therapy: WFL for tasks assessed/performed Overall Cognitive Status: Within Functional Limits for tasks assessed                                       General Comments       Exercises     Shoulder Instructions      Home Living Family/patient expects to be discharged to:: Private residence Living Arrangements: Children;Parent (Daughter) Available Help at Discharge: Available PRN/intermittently (Daughter works) Type of Home: House Home Access: Level entry           [employer] Shower/Tub: Producer, television/film/video: Standard     Home Equipment: Adult nurse scooter;Hand held Stage manager (4 wheels);Wheelchair - power   Additional Comments: Lives in the baseline of mother's home      Prior Functioning/Environment Prior Level of Function : Independent/Modified Independent                        OT Problem List: Decreased range of motion;Decreased activity tolerance;Decreased strength;Impaired balance (sitting and/or standing);Decreased knowledge of precautions;Decreased safety awareness;Decreased knowledge of use of DME or AE      OT Treatment/Interventions: Self-care/ADL training;Therapeutic exercise;Energy conservation;DME and/or AE instruction;Therapeutic activities;Patient/family education    OT Goals(Current goals can be found in the care plan section) Acute Rehab OT Goals Patient  Stated Goal: "I will not go to a place. I am going home" OT Goal Formulation: With patient Time For Goal Achievement: 01/16/22 Potential to Achieve Goals: Good  OT Frequency: Min 3X/week    Co-evaluation PT/OT/SLP Co-Evaluation/Treatment: Yes Reason for Co-Treatment: For patient/therapist safety;To address functional/ADL transfers   OT goals addressed during session: ADL's and self-care      AM-PAC OT "6 Clicks" Daily Activity     Outcome Measure Help from another person eating meals?: None Help from another person taking care of personal grooming?: A Little Help from another person toileting, which includes using toliet, bedpan, or urinal?: A Lot Help from another person bathing (including washing, rinsing, drying)?: A Lot Help from another person to put on and taking off regular upper body clothing?: A Little Help from another person to put on and taking off regular lower body clothing?: A Lot 6 Click Score: 16   End of Session Equipment Utilized During Treatment: Gait belt Nurse Communication: Mobility status  Activity Tolerance: Patient tolerated treatment well Patient left: in chair;with call bell/phone within reach;with chair alarm set  OT Visit Diagnosis: Unsteadiness on feet (R26.81);Other abnormalities of gait and mobility (R26.89);Muscle weakness (generalized) (M62.81);Pain Pain - Right/Left: Right Pain - part of body: Leg                Time: 7408-1448 OT Time  Calculation (min): 18 min Charges:  OT General Charges $OT Visit: 1 Visit OT Evaluation $OT Eval Moderate Complexity: 1 Mod  Kariya Lavergne MSOT, OTR/L Acute Rehab Pager: [phone] Office: [phone]  Theodoro Grist Hayzel Ruberg 01/02/2022, 3:06 PM

## 2022-01-02 NOTE — Progress Notes (Signed)
° °  Subjective: I seen and evaluated Antonio [name] at bedside.  He was sitting on the bed eating breakfast.  He endorsed some right leg pain.  He was elevating the right foot as directed.  Has not had a bowel movement since surgery.  Eating and drinking appropriately.  Objective:  Vital signs in last 24 hours: Vitals:   01/01/22 2316 01/02/22 0354 01/02/22 0735 01/02/22 1212  BP:  (!) 157/68 (!) 165/70 (!) 122/57  Pulse:  81 88 67  Resp:  17 18 15   Temp: 97.6 F (36.4 C) 97.6 F (36.4 C) 97.6 F (36.4 C) 97.7 F (36.5 C)  TempSrc: Oral Oral Oral Oral  SpO2: 94% 90% 93% 90%  Weight:      Height:       Physical Exam Constitutional:      General: He is awake.  Cardiovascular:     Rate and Rhythm: Normal rate and regular rhythm.     Heart sounds: Normal heart sounds.  Pulmonary:     Effort: Pulmonary effort is normal.     Breath sounds: Normal breath sounds and air entry.  Musculoskeletal:     Left Lower Extremity: Left leg is amputated below knee.  Feet:     Comments: Right foot neatly wrapped and bandaged.  Skin:    General: Skin is warm and dry.  Neurological:     General: No focal deficit present.     Mental Status: He is alert.  Psychiatric:        Behavior: Behavior is cooperative.     Assessment/Plan:  Principal Problem:   Osteomyelitis (HCC) Active Problems:   Type 2 diabetes mellitus with hyperglycemia, with long-term current use of insulin (HCC)   Hypertension   PVD (peripheral vascular disease) (HCC)   Preoperative cardiovascular examination  Osteomyelitis of the right fifth metatarsal POD 1 transfer amputation of fifth metatarsal Severe PAD s/p stent placement Day 1 Patient underwent surgical procedure yesterday, which was successful.  No complications noted.  No cultures needed and antibiotic therapy not indicated.  Source of infection controlled via amputation.  Patient encouraged to elevate right foot.  Hemoglobin levels dropped slightly from 14.1-->12.2,  not usually post op. WBC elevated at 10.7 likely reactive. NO sign of systemic infection. ID recc no abx indicated. --Pain control: Dilaudid and Oxycodone --Follow-up with orthopedics outpatient  Hypertension Patient continues to be hypertensive SBP 150s.  Current regimen includes amlodipine 10 mg; hydralazine 10 mg 3 times daily and Coreg 12.5 twice daily.  Patient is asymptomatic. --Increase Coreg 12.5mg --> 25 mg twice daily --Continue amlodipine 10 mg daily and hydralazine 3 times daily --Patient would benefit from ARB outpatient  Type 2 diabetes with complication Current A1c 11.3%.  Current dosing Semglee 22 units at bedtime.  Glucose readings continue to run in the 200s.  Consider increasing bedtime dose.  Tighter glucose control to promote wound healing. --Semglee 22 units increased --> Semglee 24 units --SSI resistant scale --Continue glucose management  Prior to Admission Living Arrangement: Anticipated Discharge Location: Barriers to Discharge: Dispo: Anticipated discharge in approximately 1-2 day(s).   , MD 01/02/2022, 1:01 PM Pager: [phone] After 5pm on weekdays and 1pm on weekends: On Call pager [phone]

## 2022-01-02 NOTE — Progress Notes (Signed)
Inpatient Rehab Admissions Coordinator:  ° °Per therapy recommendation,  patient was screened for CIR candidacy by Deshae Dickison, MS, CCC-SLP. At this time, Pt. Appears to be a a potential candidate for CIR. I will place   order for rehab consult per protocol for full assessment. Please contact me any with questions. ° °Pierson Vantol, MS, CCC-SLP °Rehab Admissions Coordinator  °[phone] (celll) °[phone] (office) ° °

## 2022-01-03 LAB — COMPREHENSIVE METABOLIC PANEL
ALT: 16 U/L (ref 0–44)
AST: 13 U/L — ABNORMAL LOW (ref 15–41)
Albumin: 2.5 g/dL — ABNORMAL LOW (ref 3.5–5.0)
Alkaline Phosphatase: 54 U/L (ref 38–126)
Anion gap: 7 (ref 5–15)
BUN: 33 mg/dL — ABNORMAL HIGH (ref 8–23)
CO2: 26 mmol/L (ref 22–32)
Calcium: 8.3 mg/dL — ABNORMAL LOW (ref 8.9–10.3)
Chloride: 107 mmol/L (ref 98–111)
Creatinine, Ser: 1.22 mg/dL (ref 0.61–1.24)
GFR, Estimated: >60 mL/min (ref >60)
Glucose, Bld: 111 mg/dL — ABNORMAL HIGH (ref 70–99)
Potassium: 4 mmol/L (ref 3.5–5.1)
Sodium: 140 mmol/L (ref 135–145)
Total Bilirubin: 0.4 mg/dL (ref 0.3–1.2)
Total Protein: 4.9 g/dL — ABNORMAL LOW (ref 6.5–8.1)

## 2022-01-03 LAB — GLUCOSE, CAPILLARY
Glucose-Capillary: 102 mg/dL — ABNORMAL HIGH (ref 70–99)
Glucose-Capillary: 149 mg/dL — ABNORMAL HIGH (ref 70–99)
Glucose-Capillary: 105 mg/dL — ABNORMAL HIGH (ref 70–99)
Glucose-Capillary: 221 mg/dL — ABNORMAL HIGH (ref 70–99)

## 2022-01-03 MED ORDER — SENNOSIDES-DOCUSATE SODIUM 8.6-50 MG PO TABS
2.0000 | ORAL_TABLET | Freq: Two times a day (BID) | ORAL | Status: DC
  Administered 2022-01-03 – 2022-01-09 (×13): 2 via ORAL
  Filled 2022-01-03 (×13): qty 2

## 2022-01-03 MED ORDER — ACETAMINOPHEN 500 MG PO TABS
1000.0000 mg | ORAL_TABLET | Freq: Four times a day (QID) | ORAL | Status: DC
  Administered 2022-01-03 – 2022-01-09 (×25): 1000 mg via ORAL
  Filled 2022-01-03 (×25): qty 2

## 2022-01-03 MED ORDER — CLOPIDOGREL BISULFATE 75 MG PO TABS
75.0000 mg | ORAL_TABLET | Freq: Every day | ORAL | Status: DC
  Administered 2022-01-04 – 2022-01-09 (×6): 75 mg via ORAL
  Filled 2022-01-03 (×6): qty 1

## 2022-01-03 MED ORDER — HYDRALAZINE HCL 25 MG PO TABS
25.0000 mg | ORAL_TABLET | Freq: Three times a day (TID) | ORAL | Status: DC
  Administered 2022-01-03 – 2022-01-09 (×19): 25 mg via ORAL
  Filled 2022-01-03 (×19): qty 1

## 2022-01-03 MED ORDER — ACETAMINOPHEN 500 MG PO TABS: 1000.0000 mg | ORAL_TABLET | Freq: Four times a day (QID) | ORAL | Status: DC | PRN

## 2022-01-03 MED ORDER — DOCUSATE SODIUM 100 MG PO CAPS: 100.0000 mg | ORAL_CAPSULE | Freq: Every day | ORAL | Status: DC | PRN

## 2022-01-03 MED ORDER — HYDROMORPHONE HCL 2 MG PO TABS
4.0000 mg | ORAL_TABLET | ORAL | Status: DC | PRN
  Administered 2022-01-03 – 2022-01-09 (×36): 4 mg via ORAL
  Filled 2022-01-03 (×37): qty 2

## 2022-01-03 NOTE — Plan of Care (Signed)
  Problem: Health Behavior/Discharge Planning: Goal: Ability to manage health-related needs will improve Outcome: Progressing   Problem: Clinical Measurements: Goal: Will remain free from infection Outcome: Progressing   

## 2022-01-03 NOTE — Progress Notes (Signed)
Subjective: I seen and evaluated Mr. Even at bedside.  He states that he is still having pain in the right groin, 8 out of 10.  He endorsed minimal improvement with IV Dilaudid or oxycodone.  He also endorsed that he has not had a bowel movement since surgery 2 days ago.  But denies any abdominal pain.  Patient is eating and drinking appropriately.  He is agreeable to CIR.  Objective:  Vital signs in last 24 hours: Vitals:   01/02/22 1942 01/02/22 2111 01/03/22 0616 01/03/22 0743  BP: 119/62 126/79 (!) 164/81 (!) 164/80  Pulse: 70 86 74 85  Resp: 20 20 18 20   Temp: 98.6 F (37 C) 98.6 F (37 C) 97.9 F (36.6 C) 98.7 F (37.1 C)  TempSrc: Oral Oral Oral Oral  SpO2: 93% 95%  90%  Weight:      Height:       Physical Exam Constitutional:      General: He is awake.     Interventions: Nasal cannula in place.  Cardiovascular:     Rate and Rhythm: Normal rate and regular rhythm.     Heart sounds: Normal heart sounds.  Pulmonary:     Breath sounds: Normal air entry.     Comments: Increased work of breathing noted Musculoskeletal:     Right Lower Extremity: Right leg is amputated below knee.  Feet:     Comments: Lower right leg neatly bandaged  Skin:    General: Skin is warm and dry.  Neurological:     General: No focal deficit present.     Mental Status: He is alert.  Psychiatric:        Behavior: Behavior is agitated. Behavior is cooperative.     Assessment/Plan:  Principal Problem:   Osteomyelitis ([place]) Active Problems:   Type 2 diabetes mellitus with hyperglycemia, with long-term current use of insulin (HCC)   Hypertension   PVD (peripheral vascular disease) (HCC)   Preoperative cardiovascular examination   Osteomyelitis of the right fifth metatarsal  Severe PAD s/p Right iliofemoral endarterectomy and R TMA 2 Days Post-Op  Patient still endorse right groin pain since the procedure. He states the current pain regimen is not helping. Overnight, he continued to  request pain medicine almost every 90 minutes. Will transition patient to oral pain relief vs IV dilaudid. Patient denies radiation of pain or neuropathy. Pt is able to get from bed to chair with PT. Pt recommends CIR; He is agreeable to CIR. Pt is eating and drinking appropriately. Though has not had a bowel movement since procedure two days ago. Will increase bowel regimen. Pt was noted to have increased work of breathing on exam. Lungs were clear to ascultation. O2 requirement of 2L saturating >90%. According to the nurse, pt has not been utilization the spirometer. Pt remians afebrile, low suspcicion for pneumonia at this time, possible atelectasis. Will continue to monitor for signs of infection. May obtain CXR if indicated. --ASA 81mg  daily --Plavix 75mg  daily --oral dilaudid 4mg  Q4H --Tylenol 1000mg  Q6H --Bowel regimen: Miralax, colace, Senokot  Hypertension Current regimen includes amlodipine 10 mg; hydralazine 10 mg 3 times daily and Coreg 25mg  twice daily. Pt has been normotensive overnight. Pt noted to have elevated pressure SBP>160 prior to morning dose. Will continue to monitor BP. If still elevated in the afternoon consider increasing hydralazine dose.  --Increase hydralazine 10mg  -->25mg  TID --Continue current regimen stated above  Type 2 diabetes with complication Current 123456 11.3%. Current  dosing Semglee 24  units. Glucose readings at goal <120. --Continue current regimen: Semglee 24 units at bedtime; SSI resistant scale     Prior to Admission Living Arrangement: Anticipated Discharge Location: Barriers to Discharge: Dispo: Anticipated discharge in approximately 1-2 day(s).   Timothy Lasso, MD 01/03/2022, 9:28 AM Pager: [phone] After 5pm on weekdays and 1pm on weekends: On Call pager [phone]

## 2022-01-03 NOTE — Progress Notes (Signed)
Inpatient Rehab Admissions:  Inpatient Rehab Consult received.  I met with patient at the bedside for rehabilitation assessment and to discuss goals and expectations of an inpatient rehab admission.  Pt acknowledged understanding of CIR goals and expectations. Pt interested in pursuing CIR. Pt gave permission to contact Dennie Bible (ex-wife) and Orla Jolliff (daughter). Spoke with both on the telephone. Mel Almond acknowledged that pt would have intermittent supervision after discharge.  Will discuss dispo with physiatrist. Will continue to follow.  Signed: Gayland Curry, [place], [place] Admissions Coordinator [phone]

## 2022-01-03 NOTE — Progress Notes (Addendum)
°  Progress Note    01/03/2022 8:46 AM 2 Days Post-Op  Subjective:  still some Right foot pain and groin discomfort   Vitals:   01/03/22 0616 01/03/22 0743  BP: (!) 164/81 (!) 164/80  Pulse: 74 85  Resp: 18 20  Temp: 97.9 F (36.6 C) 98.7 F (37.1 C)  SpO2:  90%   Physical Exam: Cardiac:  regular Lungs:  non labored Incisions:  right foot dressed. R groin incision is c/d/i Abdomen:  obese, non distended Neurologic: alert and oriented  CBC    Component Value Date/Time   WBC 10.7 (H) 01/02/2022 0400   RBC 4.41 01/02/2022 0400   HGB 12.6 (L) 01/02/2022 0400   HCT 39.0 01/02/2022 0400   PLT 202 01/02/2022 0400   MCV 88.4 01/02/2022 0400   MCH 28.6 01/02/2022 0400   MCHC 32.3 01/02/2022 0400   RDW 12.6 01/02/2022 0400   LYMPHSABS 0.7 01/02/2022 0400   MONOABS 0.5 01/02/2022 0400   EOSABS 0.0 01/02/2022 0400   BASOSABS 0.0 01/02/2022 0400    BMET    Component Value Date/Time   NA 140 01/03/2022 0234   K 4.0 01/03/2022 0234   CL 107 01/03/2022 0234   CO2 26 01/03/2022 0234   GLUCOSE 111 (H) 01/03/2022 0234   BUN 33 (H) 01/03/2022 0234   CREATININE 1.22 01/03/2022 0234   CALCIUM 8.3 (L) 01/03/2022 0234   GFRNONAA >60 01/03/2022 0234   GFRAA >60 02/15/2019 0455    INR    Component Value Date/Time   INR 1.26 12/23/2018 0531     Intake/Output Summary (Last 24 hours) at 01/03/2022 0846 Last data filed at 01/03/2022 0744 Gross per 24 hour  Intake 240 ml  Output 650 ml  Net -410 ml     Assessment/Plan:  66 y.o. male is s/p Right iliofemoral endarterectomy and R TMA 2 Days Post-Op    Right TMA dressing will remain in place until 2/13 Heel weight bearing only Mobilize as tolerated PT/OT recommending CIR Appreciate DM coordinator assistance with BG control   Graceann Congress, PA-C [facility] [phone] 01/03/2022 8:46 AM  VASCULAR STAFF ADDENDUM: I have independently interviewed and examined the patient. I agree with the above.   Groin site clean and dry.  Needs to keep the leg elevated as much as possible Appreciate help with his diabetes. Please implement aggressive pulmonary therapy with incentive spirometry    Fara Olden, MD [facility] Phone Number: [phone] 01/03/2022 9:43 AM

## 2022-01-04 DIAGNOSIS — K5900 Constipation, unspecified: Secondary | ICD-10-CM

## 2022-01-04 DIAGNOSIS — M86172 Other acute osteomyelitis, left ankle and foot: Secondary | ICD-10-CM

## 2022-01-04 DIAGNOSIS — E1151 Type 2 diabetes mellitus with diabetic peripheral angiopathy without gangrene: Secondary | ICD-10-CM

## 2022-01-04 LAB — GLUCOSE, CAPILLARY
Glucose-Capillary: 161 mg/dL — ABNORMAL HIGH (ref 70–99)
Glucose-Capillary: 216 mg/dL — ABNORMAL HIGH (ref 70–99)
Glucose-Capillary: 159 mg/dL — ABNORMAL HIGH (ref 70–99)
Glucose-Capillary: 116 mg/dL — ABNORMAL HIGH (ref 70–99)

## 2022-01-04 LAB — CREATININE, SERUM
Creatinine, Ser: 1.14 mg/dL (ref 0.61–1.24)
GFR, Estimated: >60 mL/min (ref >60)

## 2022-01-04 MED ORDER — POLYETHYLENE GLYCOL 3350 17 G PO PACK
17.0000 g | PACK | Freq: Two times a day (BID) | ORAL | Status: DC
  Administered 2022-01-04 – 2022-01-07 (×4): 17 g via ORAL
  Filled 2022-01-04 (×5): qty 1

## 2022-01-04 NOTE — Progress Notes (Signed)
Occupational Therapy Treatment Patient Details Name: Antonio [name]. MRN: [id number] DOB: 10/26/1956 Today's Date: 01/04/2022   History of present illness 66 yo male presenting to ED on 2/2 with nonhealing R foot ulcer. s/p R iliofemoral endarterectomy and TMA on 2/6. PMH including HTN, diabetes, PAD s/p LSFA stenting, s/p left BKA (2019), and tobacco use disorder.   OT comments  Pt progressing towards established OT goals. Pt performing sit<>stand with Mod A +2 and RW. Pt with decreased standing tolerance and difficulty weight shifting. Pt performing squat pivot to w/c with Min A +2. Pt enjoying using wheelchair in hallway with Min Guard A-Min A. Pt performing lateral scoot to drop arm recliner with Min Guard A and sliding board. Continue to recommend dc to AIR and will continue to follow acutely as admitted.    Recommendations for follow up therapy are one component of a multi-disciplinary discharge planning process, led by the attending physician.  Recommendations may be updated based on patient status, additional functional criteria and insurance authorization.    Follow Up Recommendations  Acute inpatient rehab (3hours/day)    Assistance Recommended at Discharge Frequent or constant Supervision/Assistance  Patient can return home with the following  A lot of help with walking and/or transfers;A lot of help with bathing/dressing/bathroom;Assistance with cooking/housework   Equipment Recommendations  Other (comment) (Sliding board)    Recommendations for Other Services PT consult    Precautions / Restrictions Precautions Precautions: Fall Restrictions Weight Bearing Restrictions: Yes RLE Weight Bearing:  (heel only) Other Position/Activity Restrictions: Per vascular sx note       Mobility Bed Mobility               General bed mobility comments: Sitting at EOB    Transfers Overall transfer level: Needs assistance Equipment used: Sliding board Transfers: Sit  to/from Stand, Bed to chair/wheelchair/BSC Sit to Stand: Mod assist, +2 physical assistance, From elevated surface   Squat pivot transfers: Min assist, +2 safety/equipment      Lateral/Scoot Transfers: Min guard, With slide board, +2 safety/equipment General transfer comment: Mod A +2 for power up into partial standing. Unable to achieve upright posture while also maintaining WB status (through heel only). Min A +2 for squat pivot to w/c. Lateral scoot with Min guard A using sliding board to drop arm recliner     Balance Overall balance assessment: Needs assistance Sitting-balance support: No upper extremity supported, Feet supported Sitting balance-Leahy Scale: Good     Standing balance support: Bilateral upper extremity supported, During functional activity Standing balance-Leahy Scale: Poor Standing balance comment: Unabel to maintain standing without UE support and physical A                           ADL either performed or assessed with clinical judgement   ADL Overall ADL's : Needs assistance/impaired                     Lower Body Dressing: Min guard Lower Body Dressing Details (indicate cue type and reason): Pt donning his prothetic while sitting at EOB. Toilet Transfer: Moderate assistance;+2 for physical assistance;+2 for safety/equipment;Transfer board;Requires drop arm Toilet Transfer Details (indicate cue type and reason): Lateral scoot to recliner with Mod A +2 with prothetic donned on LLE. Poor safety awareness and sequencing         Functional mobility during ADLs: Moderate assistance;+2 for physical assistance;+2 for safety/equipment (lateral scoot only) General ADL Comments: Pt sitting  at EOB upon arrival having received pain medication. Pt performing lateral scoot to recliner    Extremity/Trunk Assessment Upper Extremity Assessment Upper Extremity Assessment: Overall WFL for tasks assessed   Lower Extremity Assessment Lower Extremity  Assessment: Defer to PT evaluation RLE Deficits / Details: s/p TMA. Hip/knee WFL LLE Deficits / Details: BKA (baseline), knee extension lacking ~5-10 degrees from neutral        Vision       Perception     Praxis      Cognition Arousal/Alertness: Awake/alert Behavior During Therapy: WFL for tasks assessed/performed Overall Cognitive Status: Within Functional Limits for tasks assessed                                 General Comments: Cues for safety        Exercises      Shoulder Instructions       General Comments SpO2 93-88% on RA. Notified RN    Pertinent Vitals/ Pain       Pain Assessment Pain Assessment: Faces Faces Pain Scale: Hurts even more Pain Location: RLE Pain Descriptors / Indicators: Discomfort Pain Intervention(s): Monitored during session, Limited activity within patient's tolerance, Repositioned  Home Living                                          Prior Functioning/Environment              Frequency  Min 3X/week        Progress Toward Goals  OT Goals(current goals can now be found in the care plan section)  Progress towards OT goals: Progressing toward goals  Acute Rehab OT Goals OT Goal Formulation: With patient Time For Goal Achievement: 01/16/22 Potential to Achieve Goals: Good ADL Goals Pt Will Perform Lower Body Dressing: with modified independence;sitting/lateral leans;bed level Pt Will Transfer to Toilet: with modified independence;with transfer board;bedside commode Pt Will Perform Toileting - Clothing Manipulation and hygiene: with modified independence;sitting/lateral leans  Plan Discharge plan remains appropriate    Co-evaluation    PT/OT/SLP Co-Evaluation/Treatment: Yes Reason for Co-Treatment: Complexity of the patient's impairments (multi-system involvement);Necessary to address cognition/behavior during functional activity   OT goals addressed during session: ADL's and  self-care      AM-PAC OT "6 Clicks" Daily Activity     Outcome Measure   Help from another person eating meals?: None Help from another person taking care of personal grooming?: A Little Help from another person toileting, which includes using toliet, bedpan, or urinal?: A Lot Help from another person bathing (including washing, rinsing, drying)?: A Lot Help from another person to put on and taking off regular upper body clothing?: A Little Help from another person to put on and taking off regular lower body clothing?: A Lot 6 Click Score: 16    End of Session Equipment Utilized During Treatment: Gait belt;Other (comment) (sliding board)  OT Visit Diagnosis: Unsteadiness on feet (R26.81);Other abnormalities of gait and mobility (R26.89);Muscle weakness (generalized) (M62.81);Pain Pain - Right/Left: Right Pain - part of body: Leg   Activity Tolerance Patient tolerated treatment well   Patient Left in chair;with call bell/phone within reach;with chair alarm set   Nurse Communication Mobility status        Time: 1305-1340 OT Time Calculation (min): 35 min  Charges: OT General Charges $OT Visit: 1  Visit OT Treatments $Self Care/Home Management : 8-22 mins  Ivann Trimarco MSOT, OTR/L Acute Rehab Pager: [phone] Office: [phone]  Theodoro Grist Namiko Pritts 01/04/2022, 2:09 PM

## 2022-01-04 NOTE — Progress Notes (Signed)
° °  Subjective: I seen and evaluated Antonio [name] at bedside. He states improvement in his right groin pain. He states that he has not had a bowel movement since surgery 3 days ago. He denies abdominal pain. Otherwise, denies any other complaint. He is looking forward to CIR.  Objective:  Vital signs in last 24 hours: Vitals:   01/03/22 2021 01/03/22 2346 01/04/22 0830 01/04/22 0900  BP: 135/60 129/66 (!) 181/80 (!) 181/80  Pulse: 60 60 71 71  Resp: 17 18 18 18   Temp: 98 F (36.7 C) 97.9 F (36.6 C) 98.3 F (36.8 C) 98.3 F (36.8 C)  TempSrc: Oral Oral Oral Oral  SpO2: 96% 90% 95% 95%  Weight:      Height:       Physical Exam Constitutional:      General: He is awake. He is not in acute distress. Cardiovascular:     Rate and Rhythm: Normal rate and regular rhythm.     Heart sounds: Normal heart sounds.  Pulmonary:     Effort: Pulmonary effort is normal.     Breath sounds: Normal breath sounds and air entry.  Abdominal:     Palpations: Abdomen is soft.  Skin:    General: Skin is warm and dry.  Neurological:     General: No focal deficit present.     Mental Status: He is alert.  Psychiatric:        Behavior: Behavior normal. Behavior is cooperative.     Assessment/Plan:  Principal Problem:   Osteomyelitis (HCC) Active Problems:   Type 2 diabetes mellitus with hyperglycemia, with long-term current use of insulin (HCC)   Hypertension   PVD (peripheral vascular disease) (HCC)   Preoperative cardiovascular examination   Constipation Pt has not had a bowel movement since surgery 3 days ago. He denies abd pain. Bowel regimen will be increased. Can consider adding additional agents if pt continues to be constipated. --Scheduled Senokot 2 tabs BID --Scheduled Miralax 17g BID  Osteomyelitis of the right fifth metatarsal  Severe PAD s/p Right iliofemoral endarterectomy and R TMA 3 Days Post-Op  Pt endorse improvement in right groin pain. PT recommends CIR, pending transfer.  Pt is agreeable.  --ASA 81mg  daily --Plavix 75mg  daily --oral dilaudid 4mg  Q4H --Tylenol 1000mg  Q6H  Hypertension Current regimen includes amlodipine 10 mg; hydralazine 25 mg 3 times daily and Coreg 25mg  twice daily. Hydralazine was increased yesterday 10mg  -->25mg . Pt has been normotensive overnight. And continues to have stable vitals.  --Continue current regimen stated above  Type 2 diabetes with complication Current A1c 11.3%. Current  dosing Semglee 24 units. Glucose readings remain at goal. --Continue current regimen: Semglee 24 units at bedtime; SSI resistant scale   Prior to Admission Living Arrangement: Anticipated Discharge Location: Barriers to Discharge: Dispo: Anticipated discharge in approximately 1-2 day(s).   , MD 01/04/2022, 10:49 AM Pager: [phone] After 5pm on weekdays and 1pm on weekends: On Call pager [phone]

## 2022-01-04 NOTE — Plan of Care (Signed)

## 2022-01-04 NOTE — Progress Notes (Signed)
Physical Therapy Treatment Patient Details Name: Antonio [name]. MRN: [id number] DOB: September 07, 1956 Today's Date: 01/04/2022   History of Present Illness 66 yo male presenting to ED on 2/2 with nonhealing R foot ulcer. s/p R iliofemoral endarterectomy and TMA on 2/6. PMH including HTN, diabetes, PAD s/p LSFA stenting, s/p left BKA (2019), and tobacco use disorder.    PT Comments    Focused session on transfer training and encouraging pt to mobilize and challenge his endurance propelling a manual w/c today. Pt was unable to come to a full upright standing position with L prosthesis donned and cues to maintain weight on heel only on R, even with modAx2. Pt limited in standing upright due to deficits in strength and L knee extension ROM. Pt was able to laterally transfer without sliding board with minA, but was able to laterally scoot using sliding board with min guard assist but minA for set-up. Pt uses a power w/c at home, but is capable of managing a manual w/c appropriately. Will continue to follow acutely. Current recommendations remain appropriate.    Recommendations for follow up therapy are one component of a multi-disciplinary discharge planning process, led by the attending physician.  Recommendations may be updated based on patient status, additional functional criteria and insurance authorization.  Follow Up Recommendations  Acute inpatient rehab (3hours/day)     Assistance Recommended at Discharge PRN  Patient can return home with the following A lot of help with walking and/or transfers;A lot of help with bathing/dressing/bathroom;Assist for transportation;Help with stairs or ramp for entrance   Equipment Recommendations  Other (comment) (slide biard, drop arm BSC)    Recommendations for Other Services       Precautions / Restrictions Precautions Precautions: Fall Precaution Comments: s/p R TMA; prothesis for L prior BKA in room Restrictions Weight Bearing Restrictions:  Yes RLE Weight Bearing:  (heel only) Other Position/Activity Restrictions: Per vascular sx note     Mobility  Bed Mobility               General bed mobility comments: Sitting at EOB    Transfers Overall transfer level: Needs assistance Equipment used: Sliding board, Rolling walker (2 wheels) Transfers: Sit to/from Stand, Bed to chair/wheelchair/BSC Sit to Stand: Mod assist, +2 physical assistance, From elevated surface, +2 safety/equipment     Squat pivot transfers: Min assist, +2 safety/equipment    Lateral/Scoot Transfers: Min guard, With slide board, +2 safety/equipment General transfer comment: Mod A +2 for power up into partial standing. Unable to achieve upright posture while also maintaining WB status (through heel only), x2 reps from EOB to RW. Min A +2 for squat pivot to w/c to L with arm rest off. Lateral scoot with Min guard A using sliding board to drop arm recliner.    Ambulation/Gait               General Gait Details: unable   Psychologist, counselling mobility: Yes Wheelchair propulsion: Both upper extremities, Both lower extermities (heel only on R) Wheelchair parts: Supervision/cueing Distance: 175 Wheelchair Assistance Details (indicate cue type and reason): Cues to navigate w/c with bil UE and bil lower extremities, using heel only on R. Extra time and x2 rest breaks due to fatigue. ABle to negotiate w/c in open hall and tight spaces, bumping only x2 obstacles and readjusts without physical assistance.  Modified Rankin (Stroke Patients Only)  Balance Overall balance assessment: Needs assistance Sitting-balance support: No upper extremity supported, Feet supported Sitting balance-Leahy Scale: Good     Standing balance support: Bilateral upper extremity supported, During functional activity Standing balance-Leahy Scale: Poor Standing balance comment: Unabel to maintain standing  without UE support and physical A                            Cognition Arousal/Alertness: Awake/alert Behavior During Therapy: WFL for tasks assessed/performed Overall Cognitive Status: Within Functional Limits for tasks assessed                                 General Comments: Cues for safety. Attention span is poor, but likely baseline. Can be inappropriate in comments at times, needs cues to remain approprite, likely baseline.        Exercises      General Comments General comments (skin integrity, edema, etc.): SpO2 93-88% on RA. Notified RN, re-donned [place] at 2L      Pertinent Vitals/Pain Pain Assessment Pain Assessment: Faces Faces Pain Scale: Hurts even more Pain Location: RLE Pain Descriptors / Indicators: Discomfort Pain Intervention(s): Limited activity within patient's tolerance, Monitored during session, Repositioned    Home Living                          Prior Function            PT Goals (current goals can now be found in the care plan section) Acute Rehab PT Goals Patient Stated Goal: to go home PT Goal Formulation: With patient Time For Goal Achievement: 01/16/22 Potential to Achieve Goals: Good Progress towards PT goals: Progressing toward goals    Frequency    Min 3X/week      PT Plan Current plan remains appropriate    Co-evaluation PT/OT/SLP Co-Evaluation/Treatment: Yes Reason for Co-Treatment: Complexity of the patient's impairments (multi-system involvement);Necessary to address cognition/behavior during functional activity;For patient/therapist safety;To address functional/ADL transfers PT goals addressed during session: Mobility/safety with mobility;Balance;Proper use of DME OT goals addressed during session: ADL's and self-care      AM-PAC PT "6 Clicks" Mobility   Outcome Measure  Help needed turning from your back to your side while in a flat bed without using bedrails?: A Little Help needed  moving from lying on your back to sitting on the side of a flat bed without using bedrails?: A Little Help needed moving to and from a bed to a chair (including a wheelchair)?: A Little Help needed standing up from a chair using your arms (e.g., wheelchair or bedside chair)?: Total Help needed to walk in hospital room?: Total Help needed climbing 3-5 steps with a railing? : Total 6 Click Score: 12    End of Session Equipment Utilized During Treatment: Gait belt;Oxygen Activity Tolerance: Patient tolerated treatment well Patient left: in chair;with call bell/phone within reach;with chair alarm set Nurse Communication: Mobility status;Other (comment) (SpO2) PT Visit Diagnosis: Other abnormalities of gait and mobility (R26.89);Unsteadiness on feet (R26.81);Muscle weakness (generalized) (M62.81);Difficulty in walking, not elsewhere classified (R26.2)     Time: 0932-3557 PT Time Calculation (min) (ACUTE ONLY): 35 min  Charges:  $Therapeutic Activity: 8-22 mins                     Raymond Gurney, PT, DPT Acute Rehabilitation Services  Pager: [phone] Office: [phone]  Henrene Dodge Pettis 01/04/2022, 2:42 PM

## 2022-01-04 NOTE — Progress Notes (Addendum)
°  Progress Note    01/04/2022 7:57 AM 3 Days Post-Op  Subjective:  complaining of sore buttock from sitting/ lying down   Vitals:   01/03/22 2021 01/03/22 2346  BP: 135/60 129/66  Pulse: 60 60  Resp: 17 18  Temp: 98 F (36.7 C) 97.9 F (36.6 C)  SpO2: 96% 90%   Physical Exam: Cardiac:  regular Lungs:  non labored Incisions:  right foot dressed. Right groin incision is c/d/i Neurologic: alert and oriented  CBC    Component Value Date/Time   WBC 10.7 (H) 01/02/2022 0400   RBC 4.41 01/02/2022 0400   HGB 12.6 (L) 01/02/2022 0400   HCT 39.0 01/02/2022 0400   PLT 202 01/02/2022 0400   MCV 88.4 01/02/2022 0400   MCH 28.6 01/02/2022 0400   MCHC 32.3 01/02/2022 0400   RDW 12.6 01/02/2022 0400   LYMPHSABS 0.7 01/02/2022 0400   MONOABS 0.5 01/02/2022 0400   EOSABS 0.0 01/02/2022 0400   BASOSABS 0.0 01/02/2022 0400    BMET    Component Value Date/Time   NA 140 01/03/2022 0234   K 4.0 01/03/2022 0234   CL 107 01/03/2022 0234   CO2 26 01/03/2022 0234   GLUCOSE 111 (H) 01/03/2022 0234   BUN 33 (H) 01/03/2022 0234   CREATININE 1.14 01/04/2022 0140   CALCIUM 8.3 (L) 01/03/2022 0234   GFRNONAA >60 01/04/2022 0140   GFRAA >60 02/15/2019 0455    INR    Component Value Date/Time   INR 1.26 12/23/2018 0531     Intake/Output Summary (Last 24 hours) at 01/04/2022 0757 Last data filed at 01/04/2022 0644 Gross per 24 hour  Intake --  Output 810 ml  Net -810 ml     Assessment/Plan:  66 y.o. male is s/p right iliofemoral endart with R TMA  3 Days Post-Op   Right TMA dressing will remain in place until 2/13 Heel weight bearing only Pain well controlled R groin well appearing,c/d/I no swelling or hematoma, dry Mobilize as tolerated PT/OT recommending CIR  VASCULAR STAFF ADDENDUM: I have independently interviewed and examined the patient. I agree with the above.  Will continue to watch for appropriate healing of the right groin.  Dressing down next week   J. Melene Muller, MD [facility] Phone Number: [phone] 01/04/2022 10:14 AM

## 2022-01-05 ENCOUNTER — Inpatient Hospital Stay (HOSPITAL_COMMUNITY): Payer: Medicaid Other

## 2022-01-05 LAB — CREATININE, SERUM
Creatinine, Ser: 0.99 mg/dL (ref 0.61–1.24)
GFR, Estimated: >60 mL/min (ref >60)

## 2022-01-05 LAB — CBC
HCT: 36.1 % — ABNORMAL LOW (ref 39.0–52.0)
Hemoglobin: 11.8 g/dL — ABNORMAL LOW (ref 13.0–17.0)
MCH: 29.4 pg (ref 26.0–34.0)
MCHC: 32.7 g/dL (ref 30.0–36.0)
MCV: 90 fL (ref 80.0–100.0)
Platelets: 196 10*3/uL (ref 150–400)
RBC: 4.01 MIL/uL — ABNORMAL LOW (ref 4.22–5.81)
RDW: 12.6 % (ref 11.5–15.5)
WBC: 7.8 10*3/uL (ref 4.0–10.5)
nRBC: 0 % (ref 0.0–0.2)

## 2022-01-05 LAB — GLUCOSE, CAPILLARY
Glucose-Capillary: 81 mg/dL (ref 70–99)
Glucose-Capillary: 180 mg/dL — ABNORMAL HIGH (ref 70–99)
Glucose-Capillary: 100 mg/dL — ABNORMAL HIGH (ref 70–99)
Glucose-Capillary: 117 mg/dL — ABNORMAL HIGH (ref 70–99)

## 2022-01-05 MED ORDER — IRBESARTAN 150 MG PO TABS
75.0000 mg | ORAL_TABLET | Freq: Every day | ORAL | Status: DC
  Administered 2022-01-05 – 2022-01-09 (×5): 75 mg via ORAL
  Filled 2022-01-05 (×5): qty 1

## 2022-01-05 NOTE — Progress Notes (Signed)
° °  Subjective: I seen and evaluated Antonio [name] at bedside.  He was agitated because he received his pain medication 30 minutes behind schedule.  Otherwise he states that current pain regimen provides moderate relief.  He states that he had a bowel movement last night.  Denies any other complaints at this time.   Objective:  Vital signs in last 24 hours: Vitals:   01/04/22 1944 01/04/22 2330 01/05/22 0327 01/05/22 0734  BP: 119/65 (!) 96/59 (!) 148/79 (!) 157/88  Pulse: 60 60 60 60  Resp: 19 18 18 20   Temp: 98 F (36.7 C) 97.9 F (36.6 C) 97.8 F (36.6 C) 97.9 F (36.6 C)  TempSrc: Oral Oral Oral Oral  SpO2: 94% 92% 92% 91%  Weight:      Height:       Physical Exam Constitutional:      Interventions: Nasal cannula in place.  Cardiovascular:     Rate and Rhythm: Normal rate.     Heart sounds: Normal heart sounds.  Pulmonary:     Effort: Pulmonary effort is normal.     Breath sounds: Normal air entry. Examination of the right-lower field reveals decreased breath sounds. Examination of the left-lower field reveals decreased breath sounds. Decreased breath sounds present.  Genitourinary:    Comments: Right groin incision, clean and dry. Healing appropriately. No sign of infection Neurological:     Mental Status: He is alert.  Psychiatric:        Behavior: Behavior is cooperative.     Assessment/Plan:  Principal Problem:   Osteomyelitis (HCC) Active Problems:   Type 2 diabetes mellitus with hyperglycemia, with long-term current use of insulin (HCC)   Hypertension   PVD (peripheral vascular disease) (HCC)   Preoperative cardiovascular examination  Osteomyelitis of the right fifth metatarsal  Severe PAD s/p Right iliofemoral endarterectomy and R TMA 4 Days Post-Op  No abx indicated. No sign of infection. Pending CIR. Pain well managed.  Since procedure 4 days ago, patient has required oxygen supplementation.  Patient has risk factors for HAP including chronic smoker,  post-op, immobility secondary to amputee and patient has not utilize spirometry adequately.  Will obtain chest x-ray.  No fevers or chills.  Decreased breath sounds at bilateral lung bases noted. -- Chest x-ray pending --ASA 81mg  daily --Plavix 75mg  daily --oral dilaudid 4mg  Q4H --Tylenol 1000mg  Q6H  Hypertension Current regimen includes amlodipine 10 mg; hydralazine 25 mg 3 times daily and Coreg 25mg  twice daily.  If blood pressure remains elevated tomorrow consider adding ARB, creatinine levels are within normal limits. --Continue current regimen stated above   Type 2 diabetes with complication Current A1c 11.3%. Current  dosing Semglee 24 units. Glucose readings acceptable. --Continue current regimen: Semglee 24 units at bedtime; SSI resistant scale   Constipation, resolved Patient endorsed bowel movement last night.  Continue bowel regimen given patient is still on opioids for pain relief. --Scheduled Senokot 2 tabs BID --Scheduled Miralax 17g BID  Prior to Admission Living Arrangement: Anticipated Discharge Location: Barriers to Discharge: Dispo: Anticipated discharge in approximately 1-2 day(s).   , MD 01/05/2022, 10:56 AM Pager: [phone] After 5pm on weekdays and 1pm on weekends: On Call pager [phone]

## 2022-01-05 NOTE — PMR Pre-admission (Signed)
PMR Admission Coordinator Pre-Admission Assessment  Patient: Antonio [name]. is an 66 y.o., male MRN: [id number] DOB: 1955-12-16 Height: 6' (182.9 cm) Weight: 93 kg  Insurance Information HMO:     PPO:      PCP:      IPA:      80/20:      OTHER:  PRIMARY: [place] U[employer]      Policy#: [id number]      Subscriber: patient CM Name: Daralene Milch      Phone#: [phone]     Fax#: [phone] Pre-Cert#: [id number] Received insurance authorization on 01/08/21. Pt approved for 7 days     Employer:  Benefits:  Phone #: [email or web address]     Name:  Eff. Date: 05/26/21-05/25/22     Deduct: does not have deductible      Out of Pocket Max: $0      Life Max: NA CIR: 100% coverage      SNF: 100% coverage Outpatient: 100% coverage (27 visits per cal year all therapy disciplines combined)     Co-Pay:  Home Health: 100% coverage      Co-Pay:  DME: 100% coverage     Co-Pay:  Providers: in-network SECONDARY:       Policy#:      Phone#:   Development worker, community:       Phone#:   The Actuary for patients in Inpatient Rehabilitation Facilities with attached Privacy Act [place] Records was provided and verbally reviewed with: N/A  Emergency Contact Information Contact Information     Name Relation Home Work Mobile   [place] Daughter   [phone]   Robbins,David Relative   [phone]   Beazer,Robin    [phone]       Current Medical History  Patient Admitting Diagnosis: osteomyelitis, s/p iliofemoral endarterectomy and R TMA History of Present Illness: Pt is a 66 year old male with medical hx significant for: HTN, diabetes, PAD s/p LSFA stenting, s/p L BKA (2019), tobacco use disorder. Pt presented to hospital on 12/28/21 d/t c/o right foot ulcer, right leg pain and back pain. Pt reported chronic right leg pain worsening over past 1 month. Ulceration on lateral side of right foot noticed 1 month ago and has grown in size, increased in pain  and has malodorous discharge. X-ray revealed osteomyelitis at fifth metatarsal. Orthopedics consulted and rx 5th ray amputation. Pt had right lower extremity angiogram on 12/29/21 which determined severe, global burden of calcific atherosclerosis. Distal external iliac and common femoral artery disease not amenable to stenting. Pt had right iliofemoral endarterectomy with patch angioplasty and transmetatarsal foot amputation on 01/01/22. Therapy evaluations performed and CIR recommended d/t pt's deficits in functional mobility and ability to complete ADLs independently.      Patient's medical record from [facility] has been reviewed by the rehabilitation admission coordinator and physician.  Past Medical History  Past Medical History:  Diagnosis Date   Coronary artery disease    Diabetes mellitus without complication ([place])    Hyperlipidemia    Hypertension    Osteomyelitis ([place]) 03/25/2017   RT FOOT    Has the patient had major surgery during 100 days prior to admission? Yes  Family History   family history includes CAD (age of onset: 59) in his sister; Diabetes in his mother; Emphysema in his father.  Current Medications  Current Facility-Administered Medications:    0.9 %  sodium chloride infusion, 500 mL, Intravenous, Once PRN, Dagoberto Ligas, PA-C   acetaminophen (TYLENOL)  tablet 1,000 mg, 1,000 mg, Oral, Q6H, Christian, Rylee, MD, 1,000 mg at 01/08/22 1815   albuterol (PROVENTIL) (2.5 MG/3ML) 0.083% nebulizer solution 2.5 mg, 2.5 mg, Nebulization, Q4H PRN, Eveland, Matthew, PA-C   alum & mag hydroxide-simeth (MAALOX/MYLANTA) 200-200-20 MG/5ML suspension 15-30 mL, 15-30 mL, Oral, Q2H PRN, Eveland, Matthew, PA-C   amLODipine (NORVASC) tablet 10 mg, 10 mg, Oral, Daily, Dagoberto Ligas, PA-C, 10 mg at 01/08/22 0258   aspirin EC tablet 81 mg, 81 mg, Oral, Q0600, Dagoberto Ligas, PA-C, 81 mg at 01/08/22 5277   atorvastatin (LIPITOR) tablet 40 mg, 40 mg, Oral, q1800, Dagoberto Ligas, PA-C, 40 mg at 01/08/22 1815   carvedilol (COREG) tablet 25 mg, 25 mg, Oral, BID WC, Timothy Lasso, MD, 25 mg at 01/08/22 1815   clopidogrel (PLAVIX) tablet 75 mg, 75 mg, Oral, Daily, Timothy Lasso, MD, 75 mg at 01/08/22 0951   docusate sodium (COLACE) capsule 100 mg, 100 mg, Oral, Daily PRN, Timothy Lasso, MD   enoxaparin (LOVENOX) injection 40 mg, 40 mg, Subcutaneous, Q24H, Dagoberto Ligas, PA-C, 40 mg at 01/07/22 2226   hydrALAZINE (APRESOLINE) tablet 25 mg, 25 mg, Oral, Q8H, Timothy Lasso, MD, 25 mg at 01/08/22 1325   HYDROmorphone (DILAUDID) tablet 4 mg, 4 mg, Oral, Q4H PRN, Christian, Rylee, MD, 4 mg at 01/08/22 1815   insulin aspart (novoLOG) injection 0-20 Units, 0-20 Units, Subcutaneous, TID WC, Dagoberto Ligas, PA-C, 4 Units at 01/08/22 1813   insulin aspart (novoLOG) injection 0-5 Units, 0-5 Units, Subcutaneous, QHS, Dagoberto Ligas, PA-C, 1 Units at 01/03/22 2215   insulin glargine-yfgn (SEMGLEE) injection 24 Units, 24 Units, Subcutaneous, QHS, Timothy Lasso, MD, 24 Units at 01/07/22 2225   irbesartan (AVAPRO) tablet 75 mg, 75 mg, Oral, Daily, Timothy Lasso, MD, 75 mg at 01/08/22 0950   metoprolol tartrate (LOPRESSOR) injection 2-5 mg, 2-5 mg, Intravenous, Q2H PRN, Dagoberto Ligas, PA-C   nystatin (MYCOSTATIN/NYSTOP) topical powder, , Topical, BID, Dagoberto Ligas, PA-C, 1 Units at 01/07/22 2227   ondansetron (ZOFRAN) injection 4 mg, 4 mg, Intravenous, Q6H PRN, Eveland, Matthew, PA-C   pantoprazole (PROTONIX) EC tablet 40 mg, 40 mg, Oral, Daily, Dagoberto Ligas, PA-C, 40 mg at 01/08/22 8242   senna-docusate (Senokot-S) tablet 2 tablet, 2 tablet, Oral, BID, Timothy Lasso, MD, 2 tablet at 01/08/22 3536  Patients Current Diet:  Diet Order             Diet heart healthy/carb modified Room service appropriate? Yes; Fluid consistency: Thin  Diet effective now                   Precautions / Restrictions Precautions Precautions:  Fall Precaution Comments: s/p R TMA; prothesis for L prior BKA in room Restrictions Weight Bearing Restrictions: Yes RLE Weight Bearing: Partial weight bearing (WB through heel only) Other Position/Activity Restrictions: Per vascular sx note   Has the patient had 2 or more falls or a fall with injury in the past year? No  Prior Activity Level Limited Community (1-2x/wk): drives, gets out of house 2x/week  Prior Functional Level Self Care: Did the patient need help bathing, dressing, using the toilet or eating? Independent  Indoor Mobility: Did the patient need assistance with walking from room to room (with or without device)? Independent  Stairs: Did the patient need assistance with internal or external stairs (with or without device)? Independent  Functional Cognition: Did the patient need help planning regular tasks such as shopping or remembering to take medications? Independent  Patient Information Are you of Hispanic,  Latino/a,or Spanish origin?: A. No, not of Hispanic, Latino/a, or Spanish origin What is your race?: A. White Do you need or want an interpreter to communicate with a doctor or health care staff?: 0. No  Patient's Response To:  Health Literacy and Transportation Is the patient able to respond to health literacy and transportation needs?: Yes Health Literacy - How often do you need to have someone help you when you read instructions, pamphlets, or other written material from your doctor or pharmacy?: Never In the past 12 months, has lack of transportation kept you from medical appointments or from getting medications?: No In the past 12 months, has lack of transportation kept you from meetings, work, or from getting things needed for daily living?: No  Development worker, international aid / [place] Devices/Equipment: CBG Meter, Wheelchair Home Equipment: Civil engineer, contracting, Transport planner, Engineer, manufacturing held shower head, Rollator (4 wheels), Wheelchair - power  Prior  Device Use: Indicate devices/aids used by the patient prior to current illness, exacerbation or injury? Motorized wheelchair or scooter and Barista  Overall Cognitive Status: No family/caregiver present to determine baseline cognitive functioning Orientation Level: Oriented X4 General Comments: Pt removing Powers and inititally not allowing staff to check his O2 after transfer. Once agreeable SpO2 found to be 87. Inappropriate joking. Quickly alternates between "joking," upset, and at times pleasant. Gives conflicting reports at times. Decreased/disregard for safety awareness. Suspect he is at baseline.    Extremity Assessment (includes Sensation/Coordination)  Upper Extremity Assessment: Generalized weakness, Overall WFL for tasks assessed  Lower Extremity Assessment: Defer to PT evaluation RLE Deficits / Details: s/p TMA. Hip/knee WFL LLE Deficits / Details: BKA (baseline), knee extension lacking ~5-10 degrees from neutral    ADLs  Overall ADL's : Needs assistance/impaired Eating/Feeding: Set up, Sitting Grooming: Set up, Sitting Upper Body Bathing: Set up, Supervision/ safety, Sitting Lower Body Bathing: Moderate assistance, +2 for physical assistance, +2 for safety/equipment, Sitting/lateral leans Upper Body Dressing : Set up, Supervision/safety, Sitting Lower Body Dressing: Min guard Lower Body Dressing Details (indicate cue type and reason): Pt donning his prothetic while sitting at EOB. Toilet Transfer: Squat-pivot, Min guard, Minimal assistance Toilet Transfer Details (indicate cue type and reason): EOB to drop arm recliner. Assist for setup and min guard for safety. Able to squat pivot to left and right sides. Functional mobility during ADLs: Moderate assistance, +2 for physical assistance, +2 for safety/equipment (lateral scoot only) General ADL Comments: Donned LLE prosthetic sitting EOB then completed 2 squat pivots going to each side.     Mobility  General bed mobility comments: Sitting at EOB    Transfers  Overall transfer level: Needs assistance Equipment used: Sliding board, Rolling walker (2 wheels) Transfers: Bed to chair/wheelchair/BSC Sit to Stand: Mod assist, +2 physical assistance, From elevated surface, +2 safety/equipment Bed to/from chair/wheelchair/BSC transfer type:: Squat pivot Squat pivot transfers: Min guard, Min assist  Lateral/Scoot Transfers: Min guard, With slide board, +2 safety/equipment General transfer comment: Able to clear hips to squat pivot to each side. EOB<>drop arm recliner.    Ambulation / Gait / Stairs / Sport and exercise psychologist Details: unable Product manager mobility: Yes Wheelchair propulsion: Both upper extremities, Both lower extermities (heel only on R) Wheelchair parts: Supervision/cueing Distance: [place] Details (indicate cue type and reason): Cues to navigate w/c with bil UE and bil lower extremities, using heel only on R. Extra time and x2 rest breaks due to fatigue. ABle to negotiate w/c  in open hall and tight spaces, bumping only x2 obstacles and readjusts without physical assistance.    Posture / Balance Balance Overall balance assessment: Needs assistance Sitting-balance support: No upper extremity supported, Feet supported Sitting balance-Leahy Scale: Good Standing balance support: Bilateral upper extremity supported, During functional activity Standing balance-Leahy Scale: Poor Standing balance comment: able to clear hips for squat pivot. Unable to attempt standing d/t WB through heel restriction on RLE and ill-fitting LLE prosthetic.    Special needs/care consideration Oxygen 2L nasal cannula, Skin Ecchymosis: groin/bilateral; Amputation: toe/right; Cellulitis: leg/left; Cracking: foot/right; Surgical incision: groin/right; leg/right, and Diabetic management novoLOG 0-20 units 3x daily at meals; novoLOG 0-5  units at bedtime; Semglee 24 units at bedtime   Previous Home Environment (from acute therapy documentation) Living Arrangements: Children, Parent  Lives With: Daughter, Family Available Help at Discharge: Family, Available PRN/intermittently Type of Home: House Home Layout: Able to live on main level with bedroom/bathroom (pt lives in an apartment in basement) Home Access: Level entry Bathroom Shower/Tub: Walk-in IT sales professional Toilet: Handicapped height Bathroom Accessibility: Yes How Accessible: Accessible via wheelchair, Accessible via walker [place]: No Additional Comments: Lives in the baseline of mother's home  Discharge Living Setting Plans for Discharge Living Setting: Patient's home Type of Home at Discharge: House Discharge Home Layout: Able to live on main level with bedroom/bathroom (pt lives in an apartment in basement) Discharge Home Access: Level entry Discharge Bathroom Shower/Tub: Walk-in shower Discharge Bathroom Toilet: Handicapped height Discharge Bathroom Accessibility: Yes How Accessible: Accessible via wheelchair, Accessible via walker Does the patient have any problems obtaining your medications?: Yes (Describe) (financial)  Social/Family/Support Systems Anticipated Caregiver: Quinn Quam, daughter Anticipated Caregiver's Contact Information: [phone] Caregiver Availability: Intermittent Discharge Plan Discussed with Primary Caregiver: Yes Is Caregiver In Agreement with Plan?: Yes Does Caregiver/Family have Issues with Lodging/Transportation while Pt is in Rehab?: No  Goals Patient/Family Goal for Rehab: Mod I-Supevision: PT/OT Expected length of stay: 7-10 days Pt/Family Agrees to Admission and willing to participate: Yes Program Orientation Provided & Reviewed with Pt/Caregiver Including Roles  & Responsibilities: Yes  Decrease burden of Care through IP rehab admission: NA  Possible need for SNF placement upon discharge: Not  anticipated  Patient Condition: I have reviewed medical records from [facility], spoken with CM, and patient and daughter. I met with patient at the bedside and discussed via phone for inpatient rehabilitation assessment.  Patient will benefit from ongoing PT and OT, can actively participate in 3 hours of therapy a day 5 days of the week, and can make measurable gains during the admission.  Patient will also benefit from the coordinated team approach during an Inpatient Acute Rehabilitation admission.  The patient will receive intensive therapy as well as Rehabilitation physician, nursing, social worker, and care management interventions.  Due to safety, skin/wound care, disease management, medication administration, pain management, and patient education the patient requires 24 hour a day rehabilitation nursing.  The patient is currently Min G-Min A with mobility and basic ADLs.  Discharge setting and therapy post discharge at home with home health is anticipated.  Patient has agreed to participate in the Acute Inpatient Rehabilitation Program and will admit today.  Preadmission Screen Completed By:  Bethel Born, 01/08/2022 6:30 PM ______________________________________________________________________   Discussed status with Dr. Dagoberto Ligas on 01/09/22 at 56 and received approval for admission today.  Admission Coordinator:  Bethel Born, CCC-SLP, time 1000/Date 01/09/22   Assessment/Plan: Diagnosis: Does the need for close, 24 hr/day  Medical supervision in concert with the patient's rehab needs make it unreasonable for this patient to be served in a less intensive setting? Yes Co-Morbidities requiring supervision/potential complications: PAD; DM, HTN; old L BKA with new R TMA due to osteomyelitis Due to bladder management, bowel management, safety, skin/wound care, disease management, medication administration, pain management, and patient education, does the patient require 24  hr/day rehab nursing? Yes Does the patient require coordinated care of a physician, rehab nurse, PT, OT, and SLP to address physical and functional deficits in the context of the above medical diagnosis(es)? Yes Addressing deficits in the following areas: balance, endurance, locomotion, strength, transferring, bathing, dressing, feeding, grooming, and toileting Can the patient actively participate in an intensive therapy program of at least 3 hrs of therapy 5 days a week? Yes The potential for patient to make measurable gains while on inpatient rehab is good Anticipated functional outcomes upon discharge from inpatient rehab: modified independent and supervision PT, modified independent and supervision OT, n/a SLP Estimated rehab length of stay to reach the above functional goals is: 7-10 days  Anticipated discharge destination: Home 10. Overall Rehab/Functional Prognosis: good   MD Signature:

## 2022-01-05 NOTE — H&P (Signed)
Physical Medicine and Rehabilitation Admission H&P    Chief Complaint  Patient presents with   Skin Ulcer  : HPI: Antonio [name], Antonio [name]. 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  Per chart review lives with daughter.  1 level home.  Mostly uses an Transport planner for mobility.  Presented 12/28/2021 with ischemic right lower extremity.  Right foot films and imaging showed soft tissue wound lateral to the fifth metatarsal head with new cortical erosion on both sides of fifth metatarsal phalangeal joint indicating osteomyelitis.  Diagnostic angiography demonstrated single-vessel inline flow to the foot with significant microvascular disease from poorly controlled diabetes.  Patient underwent right sided iliofemoral endarterectomy and bovine patch 01/01/2022 per Dr. Virl Cagey as well as right side transmetatarsal amputation.  Patient remains on aspirin and Plavix as prior to admission.  Lovenox for DVT prophylaxis..  Therapy evaluations completed due to patient decreased functional mobility was admitted for a comprehensive rehab program.  Pt reports pain is a constant 8/10- but was more than "11/10" prior to surgery.  Was off the chart.  Voiding well- LBM 2 days ago- upset about taking awhile to get to [facility], so refusing to eat solid food- insists on soft food.    Review of Systems  Constitutional:  Negative for chills and fever.  HENT:  Negative for hearing loss.   Eyes:  Negative for blurred vision and double vision.  Respiratory:  Negative for cough and shortness of breath.   Cardiovascular:  Positive for leg swelling. Negative for chest pain and palpitations.  Gastrointestinal:  Positive for constipation. Negative for heartburn, nausea and vomiting.  Genitourinary:  Negative for dysuria, flank pain and  hematuria.  Musculoskeletal:  Positive for joint pain and myalgias.  Skin:  Negative for rash.  All other systems reviewed and are negative. Past Medical History:  Diagnosis Date   Coronary artery disease    Diabetes mellitus without complication ([place])    Hyperlipidemia    Hypertension    Osteomyelitis ([place]) 03/25/2017   RT FOOT   Past Surgical History:  Procedure Laterality Date   AMPUTATION Right 03/27/2017   Procedure: 1st Ray Amputation Right Foot;  Surgeon: Newt Minion, MD;  Location: [place];  Service: Orthopedics;  Laterality: Right;   APPENDECTOMY     BELOW KNEE LEG AMPUTATION Left    CARDIAC CATHETERIZATION     CORONARY STENT INTERVENTION  2005   ENDARTERECTOMY Right 01/01/2022   Procedure: RIGHT ILEOFEMORAL ENDARTERECTOMY with PATCH ANGIOPLASTY;  Surgeon: Broadus John, MD;  Location: [facility];  Service: Vascular;  Laterality: Right;   Great toe amputation right.     I & D EXTREMITY Left 12/30/2015   Procedure: IRRIGATION AND DEBRIDEMENT LEFT FOOT, TRANSMETATARSAL AMPUTATION WITH APPLICATION OF ANTIBIOTIC BEADS AND WOUND VAC;  Surgeon: Newt Minion, MD;  Location: [place];  Service: Orthopedics;  Laterality: Left;   IR GASTROSTOMY TUBE MOD SED  12/23/2018   LOWER EXTREMITY ANGIOGRAPHY Right 12/29/2021   Procedure: LOWER EXTREMITY ANGIOGRAPHY;  Surgeon: Cherre Robins, MD;  Location: [facility] [address] [facility];  Service: Cardiovascular;  Laterality: Right;   TONSILLECTOMY     TRACHEOSTOMY  11/2018   TRANSMETATARSAL AMPUTATION Right 01/01/2022   Procedure: RIGHT TRANSMETATARSAL AMPUTATION;  Surgeon: Broadus John, MD;  Location: [place];  Service: Vascular;  Laterality: Right;  Family History  Problem Relation Age of Onset   Diabetes Mother    Emphysema Father    CAD Sister 32       Died of MI after flu   Social History:  reports that he quit smoking about 3 years ago. His smoking use included cigarettes. He has a 70.00 pack-year smoking history. He has never used smokeless tobacco.  He reports current alcohol use. He reports that he does not use drugs. Allergies:  Allergies  Allergen Reactions   Iodinated Contrast Media Hives, Rash and Other (See Comments)    Blisters Staph Blisters / staph   Penicillins Other (See Comments)    UNSPECIFIED REACTION  Has patient had a PCN reaction causing immediate rash, facial/tongue/throat swelling, SOB or lightheadedness with hypotension: No Has patient had a PCN reaction causing severe rash involving mucus membranes or skin necrosis: No Has patient had a PCN reaction that required hospitalization: No Has patient had a PCN reaction occurring within the last 10 years: No If all of the above answers are "NO", then may proceed with Cephalosporin use.   Medications Prior to Admission  Medication Sig Dispense Refill   acetaminophen (TYLENOL) 500 MG tablet Take 500 mg by mouth every 6 (six) hours as needed for moderate pain or headache.     aspirin EC 81 MG tablet Take 81 mg by mouth daily.     atorvastatin (LIPITOR) 40 MG tablet Take 1 tablet (40 mg total) by mouth daily at 6 PM. (Patient not taking: Reported on 12/28/2021) 30 tablet 0   insulin aspart (NOVOLOG) 100 UNIT/ML injection Inject 0-20 Units into the skin 3 (three) times daily with meals. (Patient not taking: Reported on 12/28/2021) 10 mL 11   insulin detemir (LEVEMIR) 100 UNIT/ML injection Inject 0.15 mLs (15 Units total) into the skin 2 (two) times daily. (Patient not taking: Reported on 12/28/2021) 10 mL 11   oxyCODONE (OXY IR/ROXICODONE) 5 MG immediate release tablet Take 1 tablet (5 mg total) by mouth every 6 (six) hours as needed for severe pain. (Patient not taking: Reported on 12/28/2021) 10 tablet 0      Home: [place] expects to be discharged to:: Private residence Living Arrangements: Children, Parent Available Help at Discharge: Family, Available PRN/intermittently Type of Home: House Home Access: Level entry Home Layout: Able to live on main level  with bedroom/bathroom (pt lives in an apartment in basement) Bathroom Shower/Tub: Multimedia programmer: Handicapped height Bathroom Accessibility: Yes Home Equipment: Civil engineer, contracting, Transport planner, Engineer, manufacturing held shower head, Rollator (4 wheels), Wheelchair - power Additional Comments: Lives in the baseline of mother's home  Lives With: Daughter, Family   Functional History: Prior Function Prior Level of Function : Independent/Modified Independent  Functional Status:  Mobility: Bed Mobility General bed mobility comments: Sitting at EOB Transfers Overall transfer level: Needs assistance Equipment used: Sliding board, Rolling walker (2 wheels) Transfers: Sit to/from Stand, Bed to chair/wheelchair/BSC Sit to Stand: Mod assist, +2 physical assistance, From elevated surface, +2 safety/equipment Bed to/from chair/wheelchair/BSC transfer type:: Lateral/scoot transfer, Squat pivot Squat pivot transfers: Min assist, +2 safety/equipment  Lateral/Scoot Transfers: Min guard, With slide board, +2 safety/equipment General transfer comment: Mod A +2 for power up into partial standing. Unable to achieve upright posture while also maintaining WB status (through heel only), x2 reps from EOB to RW. Min A +2 for squat pivot to w/c to L with arm rest off. Lateral scoot with Min guard A using sliding board to drop arm recliner.  Ambulation/Gait General Gait Details: unable Wheelchair Mobility Wheelchair mobility: Yes Wheelchair propulsion: Both upper extremities, Both lower extermities (heel only on R) Wheelchair parts: Supervision/cueing Distance: [place] Details (indicate cue type and reason): Cues to navigate w/c with bil UE and bil lower extremities, using heel only on R. Extra time and x2 rest breaks due to fatigue. ABle to negotiate w/c in open hall and tight spaces, bumping only x2 obstacles and readjusts without physical assistance.  ADL: ADL Overall ADL's : Needs  assistance/impaired Eating/Feeding: Set up, Sitting Grooming: Set up, Sitting Upper Body Bathing: Set up, Supervision/ safety, Sitting Lower Body Bathing: Moderate assistance, +2 for physical assistance, +2 for safety/equipment, Sitting/lateral leans Upper Body Dressing : Set up, Supervision/safety, Sitting Lower Body Dressing: Min guard Lower Body Dressing Details (indicate cue type and reason): Pt donning his prothetic while sitting at EOB. Toilet Transfer: Moderate assistance, +2 for physical assistance, +2 for safety/equipment, Transfer board, Requires drop arm Toilet Transfer Details (indicate cue type and reason): Lateral scoot to recliner with Mod A +2 with prothetic donned on LLE. Poor safety awareness and sequencing Functional mobility during ADLs: Moderate assistance, +2 for physical assistance, +2 for safety/equipment (lateral scoot only) General ADL Comments: Pt sitting at EOB upon arrival having received pain medication. Pt performing lateral scoot to recliner  Cognition: Cognition Overall Cognitive Status: Within Functional Limits for tasks assessed Orientation Level: Oriented X4 Cognition Arousal/Alertness: Awake/alert Behavior During Therapy: WFL for tasks assessed/performed Overall Cognitive Status: Within Functional Limits for tasks assessed General Comments: Cues for safety. Attention span is poor, but likely baseline. Can be inappropriate in comments at times, needs cues to remain approprite, likely baseline.  Physical Exam: Blood pressure (!) 148/68, pulse 76, temperature 98.2 F (36.8 C), temperature source Oral, resp. rate 18, height 6' (1.829 m), weight 93 kg, SpO2 91 %. Physical Exam Vitals and nursing note reviewed.  Constitutional:      Comments: Pt has breathy, gravely, hoarse voice- sitting up in bed; wearing 2L O2 by [place]; R TMA/L BKA, NAD  HENT:     Head: Normocephalic and atraumatic.     Comments: Throat has signs of healed over trach- smile equal     Right Ear: External ear normal.     Left Ear: External ear normal.     Nose: Nose normal. No congestion.     Mouth/Throat:     Mouth: Mucous membranes are dry.     Pharynx: Oropharynx is clear. No oropharyngeal exudate.  Eyes:     General:        Right eye: No discharge.        Left eye: No discharge.     Extraocular Movements: Extraocular movements intact.  Neck:     Comments: Old trach site Cardiovascular:     Rate and Rhythm: Normal rate and regular rhythm.     Heart sounds: Normal heart sounds. No murmur heard.   No gallop.  Pulmonary:     Comments: Hoarse voice, but CTA B/L- decreased at bases B/L  Abdominal:     Comments: Protuberant, soft, NT; hypoactive BS  Genitourinary:    Comments: Scrotum moderately swollen- pt says was size of grapefruit- is now large navel orange Musculoskeletal:     Comments: L BKA healed well- no redness R TMA- in ACE wrap Lacking 30 degrees of L knee extension- contracture- painful to try and extend further UE 5/5  B/L  RLE- HF/KE/KF 5/5; NT distally LLE- HF 5/5- cannot test knee due to  contracture  Skin:    Comments: Right TMA dressing in place.  Appropriately tender- covered with ACE wrap Significant Clubbing of fingernails B/L  PICC in R upper arm- signs of blood outside PICC/surrounding it  Neurological:     Comments: Patient is alert.  No acute distress and follows commands.  Oriented x3. Intact to light touch I UE however decreased below knees B/L   Psychiatric:        Mood and Affect: Mood normal.        Behavior: Behavior normal.    Results for orders placed or performed during the hospital encounter of 12/28/21 (from the past 48 hour(s))  Glucose, capillary     Status: None   Collection Time: 01/06/22  6:04 AM  Result Value Ref Range   Glucose-Capillary 98 70 - 99 mg/dL    Comment: Glucose reference range applies only to samples taken after fasting for at least 8 hours.   Comment 1 Notify RN    Comment 2 Document in Chart    Glucose, capillary     Status: Abnormal   Collection Time: 01/06/22 11:13 AM  Result Value Ref Range   Glucose-Capillary 187 (H) 70 - 99 mg/dL    Comment: Glucose reference range applies only to samples taken after fasting for at least 8 hours.  Glucose, capillary     Status: None   Collection Time: 01/06/22  4:19 PM  Result Value Ref Range   Glucose-Capillary 84 70 - 99 mg/dL    Comment: Glucose reference range applies only to samples taken after fasting for at least 8 hours.  Glucose, capillary     Status: Abnormal   Collection Time: 01/06/22  9:01 PM  Result Value Ref Range   Glucose-Capillary 155 (H) 70 - 99 mg/dL    Comment: Glucose reference range applies only to samples taken after fasting for at least 8 hours.   Comment 1 Notify RN    Comment 2 Document in Chart   Glucose, capillary     Status: Abnormal   Collection Time: 01/07/22  6:31 AM  Result Value Ref Range   Glucose-Capillary 163 (H) 70 - 99 mg/dL    Comment: Glucose reference range applies only to samples taken after fasting for at least 8 hours.   Comment 1 Notify RN    Comment 2 Document in Chart   Basic metabolic panel     Status: Abnormal   Collection Time: 01/07/22  9:46 AM  Result Value Ref Range   Sodium 140 135 - 145 mmol/L   Potassium 4.1 3.5 - 5.1 mmol/L   Chloride 106 98 - 111 mmol/L   CO2 27 22 - 32 mmol/L   Glucose, Bld 160 (H) 70 - 99 mg/dL    Comment: Glucose reference range applies only to samples taken after fasting for at least 8 hours.   BUN 25 (H) 8 - 23 mg/dL   Creatinine, Ser 0.94 0.61 - 1.24 mg/dL   Calcium 7.8 (L) 8.9 - 10.3 mg/dL   GFR, Estimated >60 >60 mL/min    Comment: (NOTE) Calculated using the CKD-EPI Creatinine Equation (2021)    Anion gap 7 5 - 15    Comment: Performed at [facility] [address], [place], [place] [address]  CBC with Differential/Platelet     Status: Abnormal   Collection Time: 01/07/22  9:46 AM  Result Value Ref Range   WBC 6.7 4.0 - 10.5 K/uL    RBC 3.87 (L) 4.22 -  5.81 MIL/uL   Hemoglobin 11.2 (L) 13.0 - 17.0 g/dL   HCT 35.3 (L) 39.0 - 52.0 %   MCV 91.2 80.0 - 100.0 fL   MCH 28.9 26.0 - 34.0 pg   MCHC 31.7 30.0 - 36.0 g/dL   RDW 12.8 11.5 - 15.5 %   Platelets 211 150 - 400 K/uL   nRBC 0.0 0.0 - 0.2 %   Neutrophils Relative % 66 %   Neutro Abs 4.4 1.7 - 7.7 K/uL   Lymphocytes Relative 21 %   Lymphs Abs 1.4 0.7 - 4.0 K/uL   Monocytes Relative 10 %   Monocytes Absolute 0.7 0.1 - 1.0 K/uL   Eosinophils Relative 2 %   Eosinophils Absolute 0.1 0.0 - 0.5 K/uL   Basophils Relative 0 %   Basophils Absolute 0.0 0.0 - 0.1 K/uL   Immature Granulocytes 1 %   Abs Immature Granulocytes 0.07 0.00 - 0.07 K/uL    Comment: Performed at [facility] [address] [place], [place] [address]  Glucose, capillary     Status: Abnormal   Collection Time: 01/07/22 11:10 AM  Result Value Ref Range   Glucose-Capillary 152 (H) 70 - 99 mg/dL    Comment: Glucose reference range applies only to samples taken after fasting for at least 8 hours.  Glucose, capillary     Status: Abnormal   Collection Time: 01/07/22  4:03 PM  Result Value Ref Range   Glucose-Capillary 150 (H) 70 - 99 mg/dL    Comment: Glucose reference range applies only to samples taken after fasting for at least 8 hours.  Glucose, capillary     Status: Abnormal   Collection Time: 01/07/22  8:51 PM  Result Value Ref Range   Glucose-Capillary 113 (H) 70 - 99 mg/dL    Comment: Glucose reference range applies only to samples taken after fasting for at least 8 hours.  Basic metabolic panel     Status: Abnormal   Collection Time: 01/08/22  1:34 AM  Result Value Ref Range   Sodium 143 135 - 145 mmol/L   Potassium 4.1 3.5 - 5.1 mmol/L   Chloride 109 98 - 111 mmol/L   CO2 27 22 - 32 mmol/L   Glucose, Bld 132 (H) 70 - 99 mg/dL    Comment: Glucose reference range applies only to samples taken after fasting for at least 8 hours.   BUN 24 (H) 8 - 23 mg/dL   Creatinine, Ser 1.05  0.61 - 1.24 mg/dL   Calcium 7.8 (L) 8.9 - 10.3 mg/dL   GFR, Estimated >60 >60 mL/min    Comment: (NOTE) Calculated using the CKD-EPI Creatinine Equation (2021)    Anion gap 7 5 - 15    Comment: Performed at [facility] [address], [place], [place] [address]  CBC with Differential/Platelet     Status: Abnormal   Collection Time: 01/08/22  1:34 AM  Result Value Ref Range   WBC 6.6 4.0 - 10.5 K/uL   RBC 3.54 (L) 4.22 - 5.81 MIL/uL   Hemoglobin 10.4 (L) 13.0 - 17.0 g/dL   HCT 32.7 (L) 39.0 - 52.0 %   MCV 92.4 80.0 - 100.0 fL   MCH 29.4 26.0 - 34.0 pg   MCHC 31.8 30.0 - 36.0 g/dL   RDW 12.7 11.5 - 15.5 %   Platelets 197 150 - 400 K/uL   nRBC 0.0 0.0 - 0.2 %   Neutrophils Relative % 63 %   Neutro Abs  4.1 1.7 - 7.7 K/uL   Lymphocytes Relative 24 %   Lymphs Abs 1.6 0.7 - 4.0 K/uL   Monocytes Relative 10 %   Monocytes Absolute 0.7 0.1 - 1.0 K/uL   Eosinophils Relative 2 %   Eosinophils Absolute 0.2 0.0 - 0.5 K/uL   Basophils Relative 0 %   Basophils Absolute 0.0 0.0 - 0.1 K/uL   Immature Granulocytes 1 %   Abs Immature Granulocytes 0.06 0.00 - 0.07 K/uL    Comment: Performed at [facility] [address], [place],  [address]   No results found.    Blood pressure (!) 148/68, pulse 76, temperature 98.2 F (36.8 C), temperature source Oral, resp. rate 18, height 6' (1.829 m), weight 93 kg, SpO2 91 %.  Medical Problem List and Plan: 1. 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  Note specifies right TMA dressing to remain in place until 2/13 as well as history of left BKA 2019  -patient may  shower if cover R TMA/RLE  -ELOS/Goals: 7-10 days mod I-  2.  Antithrombotics: -DVT/anticoagulation:  Pharmaceutical: Lovenox  -antiplatelet therapy: Aspirin 81 mg daily and Plavix 75 mg daily 3. Pain Management: Dilaudid 4 mg every 4 hours as needed 4. Mood: Provide emotional  support  -antipsychotic agents: N/A 5. Neuropsych: This patient is capable of making decisions on his own behalf. 6. Skin/Wound Care: Routine skin checks 7. Fluids/Electrolytes/Nutrition: Routine in and outs with follow-up chemistries 8.  Diabetes mellitus.  Hemoglobin A1c 11.3.  Semglee 24 units daily.  Check blood sugars before meals and at bedtime 9.  Hypertension.  Hydralazine 25 mg every 8 hours, Coreg 25 mg twice daily, Norvasc 10 mg daily, Avapro 75 mg daily.  Monitor with increased mobility 10.  Hyperlipidemia.  Lipitor 11.  CAD with stenting.  Continue aspirin and Plavix 12.  History of tobacco use.  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  13. L knee contracture- has BKA prosthesis- don't see a way to fix-  14. Scrotal swelling- since iliofemoral endarterectomy- will elevate.  15. New O2 requirement- will see if can wean off-  16. RUE PICC- will see if able to get out?  I have personally performed a face to face diagnostic evaluation of this patient and formulated the key components of the plan.  Additionally, I have personally reviewed laboratory data, imaging studies, as well as relevant notes and concur with the physician assistant's documentation above.   The patient's status has not changed from the original H&P.  Any changes in documentation from the acute care chart have been noted above.     Bryn Yerke Angiulli, PA-C 01/08/2022

## 2022-01-05 NOTE — Progress Notes (Signed)
Inpatient Rehab Admissions Coordinator:  Awaiting insurance authorization. Will continue to follow.   Nashley Cordoba Graves Madden, MS, CCC-SLP Admissions Coordinator [phone]  

## 2022-01-05 NOTE — Progress Notes (Signed)
°  Progress Note    01/05/2022 7:53 AM 4 Days Post-Op  Subjective:  Grumpy   Vitals:   01/05/22 0327 01/05/22 0734  BP: (!) 148/79 (!) 157/88  Pulse: 60 60  Resp: 18 20  Temp: 97.8 F (36.6 C) 97.9 F (36.6 C)  SpO2: 92% 91%   Physical Exam: Cardiac:  regular Lungs:  non labored Incisions:  right foot dressed. Right groin incision is c/d/i Neurologic: alert and oriented  CBC    Component Value Date/Time   WBC 7.8 01/05/2022 0156   RBC 4.01 (L) 01/05/2022 0156   HGB 11.8 (L) 01/05/2022 0156   HCT 36.1 (L) 01/05/2022 0156   PLT 196 01/05/2022 0156   MCV 90.0 01/05/2022 0156   MCH 29.4 01/05/2022 0156   MCHC 32.7 01/05/2022 0156   RDW 12.6 01/05/2022 0156   LYMPHSABS 0.7 01/02/2022 0400   MONOABS 0.5 01/02/2022 0400   EOSABS 0.0 01/02/2022 0400   BASOSABS 0.0 01/02/2022 0400    BMET    Component Value Date/Time   NA 140 01/03/2022 0234   K 4.0 01/03/2022 0234   CL 107 01/03/2022 0234   CO2 26 01/03/2022 0234   GLUCOSE 111 (H) 01/03/2022 0234   BUN 33 (H) 01/03/2022 0234   CREATININE 0.99 01/05/2022 0156   CALCIUM 8.3 (L) 01/03/2022 0234   GFRNONAA >60 01/05/2022 0156   GFRAA >60 02/15/2019 0455    INR    Component Value Date/Time   INR 1.26 12/23/2018 0531     Intake/Output Summary (Last 24 hours) at 01/05/2022 0753 Last data filed at 01/05/2022 0534 Gross per 24 hour  Intake 840 ml  Output 700 ml  Net 140 ml      Assessment/Plan:  66 y.o. male is s/p right iliofemoral endart with R TMA  4 Days Post-Op   Right TMA dressing will remain in place until 2/13 Heel weight bearing only Pain well controlled R groin well appearing,c/d/I no swelling or hematoma, dry Mobilize as tolerated PT/OT recommending CIR  J. Melene Muller, MD [facility] Phone Number: [phone] 01/05/2022 7:53 AM

## 2022-01-05 NOTE — Progress Notes (Incomplete)
Inpatient Rehabilitation Admission Medication Review by a Pharmacist  A complete drug regimen review was completed for this patient to identify any potential clinically significant medication issues.  High Risk Drug Classes Is patient taking? Indication by Medication  Antipsychotic No   Anticoagulant Yes Lovenox- VTE prophylaxis  Antibiotic No   Opioid Yes Dilaudid- acute pain  Antiplatelet Yes Aspirin/plavix- CVA prophylaxis/severe PAD  Hypoglycemics/insulin Yes iSS< levemir- T2DM  Vasoactive Medication Yes Norvasc, coreg, apresoline- hypertension  Chemotherapy No   Other Yes Lipitor- HLD Protonix- GERD     Type of Medication Issue Identified Description of Issue Recommendation(s)  Drug Interaction(s) (clinically significant)     Duplicate Therapy     Allergy     No Medication Administration End Date     Incorrect Dose     Additional Drug Therapy Needed     Significant med changes from prior encounter (inform family/care partners about these prior to discharge).    Other       Clinically significant medication issues were identified that warrant physician communication and completion of prescribed/recommended actions by midnight of the next day:  No  Time spent performing this drug regimen review (minutes):  30   Sheniece Ruggles BS, PharmD, BCPS Clinical Pharmacist 01/05/2022 2:26 PM

## 2022-01-06 LAB — GLUCOSE, CAPILLARY
Glucose-Capillary: 98 mg/dL (ref 70–99)
Glucose-Capillary: 187 mg/dL — ABNORMAL HIGH (ref 70–99)
Glucose-Capillary: 84 mg/dL (ref 70–99)
Glucose-Capillary: 155 mg/dL — ABNORMAL HIGH (ref 70–99)

## 2022-01-06 LAB — CBC
HCT: 33.5 % — ABNORMAL LOW (ref 39.0–52.0)
Hemoglobin: 10.6 g/dL — ABNORMAL LOW (ref 13.0–17.0)
MCH: 28.9 pg (ref 26.0–34.0)
MCHC: 31.6 g/dL (ref 30.0–36.0)
MCV: 91.3 fL (ref 80.0–100.0)
Platelets: 199 10*3/uL (ref 150–400)
RBC: 3.67 MIL/uL — ABNORMAL LOW (ref 4.22–5.81)
RDW: 12.7 % (ref 11.5–15.5)
WBC: 8 10*3/uL (ref 4.0–10.5)
nRBC: 0 % (ref 0.0–0.2)

## 2022-01-06 LAB — BASIC METABOLIC PANEL
Anion gap: 6 (ref 5–15)
BUN: 25 mg/dL — ABNORMAL HIGH (ref 8–23)
CO2: 28 mmol/L (ref 22–32)
Calcium: 7.9 mg/dL — ABNORMAL LOW (ref 8.9–10.3)
Chloride: 106 mmol/L (ref 98–111)
Creatinine, Ser: 1.04 mg/dL (ref 0.61–1.24)
GFR, Estimated: >60 mL/min (ref >60)
Glucose, Bld: 110 mg/dL — ABNORMAL HIGH (ref 70–99)
Potassium: 3.7 mmol/L (ref 3.5–5.1)
Sodium: 140 mmol/L (ref 135–145)

## 2022-01-06 NOTE — Progress Notes (Signed)
[facility] PROGRESS NOTE  ASSESSMENT / PLAN: Antonio [name]. is a 66 y.o. male status post right iliofemoral endarterectomy and transmetatarsal amputation on 01/01/22.  Globally, patient doing well.  Continue working on mobilization.  Daylight hours in chair.  Ambulate 3 times daily.  PT/OT.  Ultimate disposition plan CIR  SUBJECTIVE: No complaints.  Up eating breakfast.  Calling me "young man" multiple times.  OBJECTIVE: BP (!) 132/59 (BP Location: Left Arm)    Pulse 60    Temp 98.2 F (36.8 C) (Oral)    Resp 17    Ht 6' (1.829 m)    Wt 93 kg    SpO2 94%    BMI 27.81 kg/m   Intake/Output Summary (Last 24 hours) at 01/06/2022 0819 Last data filed at 01/06/2022 [id number] Gross per 24 hour  Intake 120 ml  Output 750 ml  Net -630 ml    Constitutional: Well-appearing in no acute distress Cardiac: Regular rate and rhythm. Pulmonary: Unlabored breathing Abdomen: Soft abdomen Vascular: Right groin healing appropriately.  Splint in place to right foot.  CBC Latest Ref Rng & Units 01/06/2022 01/05/2022 01/02/2022  WBC 4.0 - 10.5 K/uL 8.0 7.8 10.7(H)  Hemoglobin 13.0 - 17.0 g/dL 10.6(L) 11.8(L) 12.6(L)  Hematocrit 39.0 - 52.0 % 33.5(L) 36.1(L) 39.0  Platelets 150 - 400 K/uL 199 196 202     CMP Latest Ref Rng & Units 01/06/2022 01/05/2022 01/04/2022  Glucose 70 - 99 mg/dL 110(H) - -  BUN 8 - 23 mg/dL 25(H) - -  Creatinine 0.61 - 1.24 mg/dL 1.04 0.99 1.14  Sodium 135 - 145 mmol/L 140 - -  Potassium 3.5 - 5.1 mmol/L 3.7 - -  Chloride 98 - 111 mmol/L 106 - -  CO2 22 - 32 mmol/L 28 - -  Calcium 8.9 - 10.3 mg/dL 7.9(L) - -  Total Protein 6.5 - 8.1 g/dL - - -  Total Bilirubin 0.3 - 1.2 mg/dL - - -  Alkaline Phos 38 - 126 U/L - - -  AST 15 - 41 U/L - - -  ALT 0 - 44 U/L - - -    Estimated Creatinine Clearance: 79.8 mL/min (by C-G formula based on SCr of 1.04 mg/dL).  Yevonne Aline. Stanford Breed, MD [facility] Phone Number: [phone] 01/06/2022 8:20 AM

## 2022-01-06 NOTE — Progress Notes (Signed)
HD#9 Subjective:  Overnight Events: No acute events overnight  Antonio [name] sitting up in bed eating breakfast.  Continues to endorse pain in his leg.  Denies any shortness of breath or chest pain.  He has been able to ambulate without difficulty and states his groin pain is better when sitting up.  He is passing his bowels and urinating without difficulty  Objective:  Vital signs in last 24 hours: Vitals:   01/05/22 1932 01/05/22 2304 01/06/22 0410 01/06/22 0744  BP: 108/60 122/66 (!) 160/78 (!) 132/59  Pulse: 67  60   Resp: 18 18 18 17   Temp: 98.2 F (36.8 C) 98.2 F (36.8 C) 97.8 F (36.6 C) 98.2 F (36.8 C)  TempSrc: Oral Oral Oral Oral  SpO2: 96% 92% 92% 94%  Weight:      Height:       Supplemental O2: Room Air SpO2: 94 % O2 Flow Rate (L/min): 2 L/min  Physical Exam:  Constitutional: No acute distress HENT: normocephalic atraumatic Eyes: conjunctiva non-erythematous Neck: supple Cardiovascular: regular rate and rhythm, no m/r/g Pulmonary/Chest: normal work of breathing on 2 L nasal cannula, lungs clear to auscultation bilaterally Abdominal: soft, non-tender, non-distended MSK: normal bulk and tone.  Right groin incision clean dry and intact.  No hematoma present. Neurological: alert & oriented x 3 Skin: warm and dry Psych: Normal mood and thought process  Filed Weights   12/28/21 0924 01/01/22 0735  Weight: 93 kg 93 kg     Intake/Output Summary (Last 24 hours) at 01/06/2022 0921 Last data filed at 01/06/2022 0412 Gross per 24 hour  Intake --  Output 600 ml  Net -600 ml   Net IO Since Admission: -2,626.11 mL [01/06/22 0921]  Pertinent Labs: CBC Latest Ref Rng & Units 01/06/2022 01/05/2022 01/02/2022  WBC 4.0 - 10.5 K/uL 8.0 7.8 10.7(H)  Hemoglobin 13.0 - 17.0 g/dL 10.6(L) 11.8(L) 12.6(L)  Hematocrit 39.0 - 52.0 % 33.5(L) 36.1(L) 39.0  Platelets 150 - 400 K/uL 199 196 202    CMP Latest Ref Rng & Units 01/06/2022 01/05/2022 01/04/2022  Glucose 70 - 99 mg/dL  110(H) - -  BUN 8 - 23 mg/dL 25(H) - -  Creatinine 0.61 - 1.24 mg/dL 1.04 0.99 1.14  Sodium 135 - 145 mmol/L 140 - -  Potassium 3.5 - 5.1 mmol/L 3.7 - -  Chloride 98 - 111 mmol/L 106 - -  CO2 22 - 32 mmol/L 28 - -  Calcium 8.9 - 10.3 mg/dL 7.9(L) - -  Total Protein 6.5 - 8.1 g/dL - - -  Total Bilirubin 0.3 - 1.2 mg/dL - - -  Alkaline Phos 38 - 126 U/L - - -  AST 15 - 41 U/L - - -  ALT 0 - 44 U/L - - -    Imaging: DG CHEST PORT 1 VIEW  Result Date: 01/05/2022 CLINICAL DATA:  Hypoxia EXAM: PORTABLE CHEST 1 VIEW COMPARISON:  Portable exam 1332 hours compared to 02/04/2019 FINDINGS: Upper normal size of cardiac silhouette. Mediastinal contours and pulmonary vascularity normal. Atherosclerotic calcification aorta. Bibasilar atelectasis. Remaining lungs clear. No pleural effusion or pneumothorax. Bones demineralized. IMPRESSION: Bibasilar atelectasis. Aortic Atherosclerosis (ICD10-I70.0). Electronically Signed   By: Lavonia Dana M.D.   On: 01/05/2022 14:04    Assessment/Plan:   Principal Problem:   Osteomyelitis (HCC) Active Problems:   Type 2 diabetes mellitus with hyperglycemia, with long-term current use of insulin (HCC)   Hypertension   PVD (peripheral vascular disease) ([place])   Preoperative cardiovascular examination  Patient Summary: Antonio Starzynski. is a 66 y.o. with a pertinent history of poorly controlled hypertension, type 2 diabetes, PAD status post left BKA in 2019 and tobacco use, who presented with 1 month of worsening right foot ulcer with acute leg pain at rest and admitted for further work-up and evaluation for osteomyelitis of the right fifth metatarsophalangeal joint.  Status post right iliofemoral endarterectomy and transmetatarsal amputation on 01/01/2022.  Pending CIR placement   Osteomyelitis of the right fifth metatarsal status post transmetatarsal amputation Severe PAD status post right iliofemoral endarterectomy Postop day 5, continue to endorse right groin  pain however examination appears to be healing well with no hematoma present.  Continue pain regimen we will have patient continue to work with PT OT until he receives a bed for CIR. -Appreciate vascular surgery and orthopedics assistance -Continue aspirin, Plavix -Continue PT OT -No indication for antibiotics at this time  Hypertension Continues to have fluctuations in his blood pressure from normotensive to hypertensive.  Continue on current regimen and irbesartan was added yesterday.  We will see how he does on this ARB and monitor his electrolytes and kidney function -Continue amlodipine 10 mg daily, hydralazine 25 mg 3 times daily, carvedilol 25 twice daily, irbesartan 75 mg daily  Type 2 diabetes Significant improvement in glucose levels during his hospitalization.  Continue current regimen as per below -SSI 3 times daily with meals and nightly -Semglee 24 units nightly -Hypoglycemic protocol in place  Diet:  Heart healthy-carb modified IVF: None,None VTE: Enoxaparin Code: Full PT/OT recs: CIR, none.  Dispo: Anticipated discharge to CIR pending placement  [place] [facility] Resident PGY-2 Pager [phone] Please contact the on call pager after 5 pm and on weekends at [phone].

## 2022-01-07 LAB — CBC WITH DIFFERENTIAL/PLATELET
Abs Immature Granulocytes: 0.07 10*3/uL (ref 0.00–0.07)
Basophils Absolute: 0 10*3/uL (ref 0.0–0.1)
Basophils Relative: 0 %
Eosinophils Absolute: 0.1 10*3/uL (ref 0.0–0.5)
Eosinophils Relative: 2 %
HCT: 35.3 % — ABNORMAL LOW (ref 39.0–52.0)
Hemoglobin: 11.2 g/dL — ABNORMAL LOW (ref 13.0–17.0)
Immature Granulocytes: 1 %
Lymphocytes Relative: 21 %
Lymphs Abs: 1.4 10*3/uL (ref 0.7–4.0)
MCH: 28.9 pg (ref 26.0–34.0)
MCHC: 31.7 g/dL (ref 30.0–36.0)
MCV: 91.2 fL (ref 80.0–100.0)
Monocytes Absolute: 0.7 10*3/uL (ref 0.1–1.0)
Monocytes Relative: 10 %
Neutro Abs: 4.4 10*3/uL (ref 1.7–7.7)
Neutrophils Relative %: 66 %
Platelets: 211 10*3/uL (ref 150–400)
RBC: 3.87 MIL/uL — ABNORMAL LOW (ref 4.22–5.81)
RDW: 12.8 % (ref 11.5–15.5)
WBC: 6.7 10*3/uL (ref 4.0–10.5)
nRBC: 0 % (ref 0.0–0.2)

## 2022-01-07 LAB — GLUCOSE, CAPILLARY
Glucose-Capillary: 163 mg/dL — ABNORMAL HIGH (ref 70–99)
Glucose-Capillary: 152 mg/dL — ABNORMAL HIGH (ref 70–99)
Glucose-Capillary: 150 mg/dL — ABNORMAL HIGH (ref 70–99)
Glucose-Capillary: 113 mg/dL — ABNORMAL HIGH (ref 70–99)

## 2022-01-07 LAB — BASIC METABOLIC PANEL
Anion gap: 7 (ref 5–15)
BUN: 25 mg/dL — ABNORMAL HIGH (ref 8–23)
CO2: 27 mmol/L (ref 22–32)
Calcium: 7.8 mg/dL — ABNORMAL LOW (ref 8.9–10.3)
Chloride: 106 mmol/L (ref 98–111)
Creatinine, Ser: 0.94 mg/dL (ref 0.61–1.24)
GFR, Estimated: >60 mL/min (ref >60)
Glucose, Bld: 160 mg/dL — ABNORMAL HIGH (ref 70–99)
Potassium: 4.1 mmol/L (ref 3.5–5.1)
Sodium: 140 mmol/L (ref 135–145)

## 2022-01-07 NOTE — Progress Notes (Addendum)
°  Progress Note    01/07/2022 8:28 AM 6 Days Post-Op  Subjective:  No complaints   Vitals:   01/07/22 0008 01/07/22 0300  BP: 127/69 (!) 147/80  Pulse:  65  Resp: 20 20  Temp: 98.1 F (36.7 C) 98.2 F (36.8 C)  SpO2: 92%    Physical Exam: Lungs:  non labored Incisions:  R groin incision c/d/i Extremities: splint in place RLE Neurologic: A&O  CBC    Component Value Date/Time   WBC 8.0 01/06/2022 0129   RBC 3.67 (L) 01/06/2022 0129   HGB 10.6 (L) 01/06/2022 0129   HCT 33.5 (L) 01/06/2022 0129   PLT 199 01/06/2022 0129   MCV 91.3 01/06/2022 0129   MCH 28.9 01/06/2022 0129   MCHC 31.6 01/06/2022 0129   RDW 12.7 01/06/2022 0129   LYMPHSABS 0.7 01/02/2022 0400   MONOABS 0.5 01/02/2022 0400   EOSABS 0.0 01/02/2022 0400   BASOSABS 0.0 01/02/2022 0400    BMET    Component Value Date/Time   NA 140 01/06/2022 0129   K 3.7 01/06/2022 0129   CL 106 01/06/2022 0129   CO2 28 01/06/2022 0129   GLUCOSE 110 (H) 01/06/2022 0129   BUN 25 (H) 01/06/2022 0129   CREATININE 1.04 01/06/2022 0129   CALCIUM 7.9 (L) 01/06/2022 0129   GFRNONAA >60 01/06/2022 0129   GFRAA >60 02/15/2019 0455    INR    Component Value Date/Time   INR 1.26 12/23/2018 0531     Intake/Output Summary (Last 24 hours) at 01/07/2022 9381 Last data filed at 01/06/2022 2100 Gross per 24 hour  Intake --  Output 1190 ml  Net -1190 ml     Assessment/Plan:  66 y.o. male is s/p R iliofemoral endarterectomy and TMA 6 Days Post-Op   R groin healing well Heel weightbearing only Plan to remove splint and evaluate TMA tomorrow   Emilie Rutter, PA-C [facility] [phone] 01/07/2022 8:28 AM  VASCULAR STAFF ADDENDUM: I have independently interviewed and examined the patient. I agree with the above.   Rande Brunt. Lenell Antu, MD [facility] Phone Number: [phone] 01/07/2022 10:02 AM

## 2022-01-07 NOTE — Progress Notes (Signed)
HD#10 Subjective:  Overnight Events: No acute events overnight  Antonio [name] is resting in bed comfortably reading the newspaper this morning.  He continues to endorse some groin pain but is overall feeling well.  Denies any shortness of breath or chest pain.  He endorses frequent bowel movements that are soft in nature.  His ambulating without difficulty.  He has no other acute concerns at this time.  Objective:  Vital signs in last 24 hours: Vitals:   01/06/22 2000 01/07/22 0008 01/07/22 0300 01/07/22 1108  BP: 122/77 127/69 (!) 147/80 (!) 157/71  Pulse: 62  65   Resp: 15 20 20 19   Temp: 98.2 F (36.8 C) 98.1 F (36.7 C) 98.2 F (36.8 C) 98.3 F (36.8 C)  TempSrc: Oral Oral Oral Oral  SpO2: 95% 92%  94%  Weight:      Height:       Supplemental O2: Room Air SpO2: 94 % O2 Flow Rate (L/min): 2 L/min  Physical Exam:  Constitutional: No acute distress HENT: normocephalic atraumatic Eyes: conjunctiva non-erythematous Neck: supple Cardiovascular: regular rate and rhythm, no m/r/g Pulmonary/Chest: normal work of breathing on 2 L nasal cannula, decreased breath sounds bilateral bases Abdominal: soft, non-tender, non-distended MSK: normal bulk and tone.  Right groin incision clean dry and intact.  No hematoma present.  No erythema or ecchymosis noted. Neurological: alert & oriented x 3 Skin: warm and dry Psych: Normal mood and thought process  Filed Weights   12/28/21 0924 01/01/22 0735  Weight: 93 kg 93 kg     Intake/Output Summary (Last 24 hours) at 01/07/2022 1143 Last data filed at 01/07/2022 1109 Gross per 24 hour  Intake --  Output 1465 ml  Net -1465 ml    Net IO Since Admission: -4,091.11 mL [01/07/22 1143]  Pertinent Labs: CBC Latest Ref Rng & Units 01/07/2022 01/06/2022 01/05/2022  WBC 4.0 - 10.5 K/uL 6.7 8.0 7.8  Hemoglobin 13.0 - 17.0 g/dL 11.2(L) 10.6(L) 11.8(L)  Hematocrit 39.0 - 52.0 % 35.3(L) 33.5(L) 36.1(L)  Platelets 150 - 400 K/uL 211 199 196     CMP Latest Ref Rng & Units 01/07/2022 01/06/2022 01/05/2022  Glucose 70 - 99 mg/dL 160(H) 110(H) -  BUN 8 - 23 mg/dL 25(H) 25(H) -  Creatinine 0.61 - 1.24 mg/dL 0.94 1.04 0.99  Sodium 135 - 145 mmol/L 140 140 -  Potassium 3.5 - 5.1 mmol/L 4.1 3.7 -  Chloride 98 - 111 mmol/L 106 106 -  CO2 22 - 32 mmol/L 27 28 -  Calcium 8.9 - 10.3 mg/dL 7.8(L) 7.9(L) -  Total Protein 6.5 - 8.1 g/dL - - -  Total Bilirubin 0.3 - 1.2 mg/dL - - -  Alkaline Phos 38 - 126 U/L - - -  AST 15 - 41 U/L - - -  ALT 0 - 44 U/L - - -    Imaging: No results found.  Assessment/Plan:   Principal Problem:   Osteomyelitis ([place]) Active Problems:   Type 2 diabetes mellitus with hyperglycemia, with long-term current use of insulin (HCC)   Hypertension   PVD (peripheral vascular disease) ([place])   Preoperative cardiovascular examination   Patient Summary: Antonio Caracappa. is a 66 y.o. with a pertinent history of poorly controlled hypertension, type 2 diabetes, PAD status post left BKA in 2019 and tobacco use, who presented with 1 month of worsening right foot ulcer with acute leg pain at rest and admitted for further work-up and evaluation for osteomyelitis of the right  fifth metatarsophalangeal joint.  Status post right iliofemoral endarterectomy and transmetatarsal amputation on 01/01/2022.  Doing well postoperatively and awaiting CIR placement  Osteomyelitis of the right fifth metatarsal status post transmetatarsal amputation Severe PAD status post right iliofemoral endarterectomy Postop day 6, doing well and pain is controlled with current regimen.  Vascular surgery planning to remove right lower extremity bandages tomorrow.  Medically stable and awaiting CIR placement. -Appreciate vascular surgery and orthopedics assistance -Continue aspirin, Plavix -Continue Dilaudid and Tylenol for pain -Decrease bowel regimen with frequent BMs yesterday. -Continue PT OT -No indication for antibiotics at this  time  Hypertension Blood pressure improved overnight.  Continues to have some fluctuations though.  If continues to be hypertensive will increase irbesartan as tolerated and decrease hydralazine.  BMP without changes overnight since adding ARB. -Continue amlodipine 10 mg daily, hydralazine 25 mg 3 times daily, carvedilol 25 twice daily, irbesartan 75 mg daily  Type 2 diabetes Glucose much improved on current insulin regimen.  If morning glucose levels are still elevated will consider increasing Semglee by 20% tomorrow -SSI 3 times daily with meals and nightly -Semglee 24 units nightly -Hypoglycemic protocol in place  Diet:  Heart healthy-carb modified IVF: None,None VTE: Enoxaparin Code: Full PT/OT recs: CIR, none.  Dispo: Anticipated discharge to CIR pending placement  [place] [facility] Resident PGY-2 Pager [phone] Please contact the on call pager after 5 pm and on weekends at [phone].

## 2022-01-08 LAB — GLUCOSE, CAPILLARY
Glucose-Capillary: 113 mg/dL — ABNORMAL HIGH (ref 70–99)
Glucose-Capillary: 138 mg/dL — ABNORMAL HIGH (ref 70–99)
Glucose-Capillary: 162 mg/dL — ABNORMAL HIGH (ref 70–99)
Glucose-Capillary: 149 mg/dL — ABNORMAL HIGH (ref 70–99)

## 2022-01-08 LAB — BASIC METABOLIC PANEL
Anion gap: 7 (ref 5–15)
BUN: 24 mg/dL — ABNORMAL HIGH (ref 8–23)
CO2: 27 mmol/L (ref 22–32)
Calcium: 7.8 mg/dL — ABNORMAL LOW (ref 8.9–10.3)
Chloride: 109 mmol/L (ref 98–111)
Creatinine, Ser: 1.05 mg/dL (ref 0.61–1.24)
GFR, Estimated: >60 mL/min (ref >60)
Glucose, Bld: 132 mg/dL — ABNORMAL HIGH (ref 70–99)
Potassium: 4.1 mmol/L (ref 3.5–5.1)
Sodium: 143 mmol/L (ref 135–145)

## 2022-01-08 LAB — CBC WITH DIFFERENTIAL/PLATELET
Abs Immature Granulocytes: 0.06 10*3/uL (ref 0.00–0.07)
Basophils Absolute: 0 10*3/uL (ref 0.0–0.1)
Basophils Relative: 0 %
Eosinophils Absolute: 0.2 10*3/uL (ref 0.0–0.5)
Eosinophils Relative: 2 %
HCT: 32.7 % — ABNORMAL LOW (ref 39.0–52.0)
Hemoglobin: 10.4 g/dL — ABNORMAL LOW (ref 13.0–17.0)
Immature Granulocytes: 1 %
Lymphocytes Relative: 24 %
Lymphs Abs: 1.6 10*3/uL (ref 0.7–4.0)
MCH: 29.4 pg (ref 26.0–34.0)
MCHC: 31.8 g/dL (ref 30.0–36.0)
MCV: 92.4 fL (ref 80.0–100.0)
Monocytes Absolute: 0.7 10*3/uL (ref 0.1–1.0)
Monocytes Relative: 10 %
Neutro Abs: 4.1 10*3/uL (ref 1.7–7.7)
Neutrophils Relative %: 63 %
Platelets: 197 10*3/uL (ref 150–400)
RBC: 3.54 MIL/uL — ABNORMAL LOW (ref 4.22–5.81)
RDW: 12.7 % (ref 11.5–15.5)
WBC: 6.6 10*3/uL (ref 4.0–10.5)
nRBC: 0 % (ref 0.0–0.2)

## 2022-01-08 NOTE — Progress Notes (Addendum)
°  Progress Note    01/08/2022 7:09 AM 7 Days Post-Op  Subjective:  no complaints  Afebrile HR 70's-80's  120's-140's systolic 91% [place]  Vitals:   01/07/22 2354 01/08/22 0432  BP: 128/67 (!) 148/68  Pulse: 82 76  Resp: 18 18  Temp: 98.6 F (37 C) 98.2 F (36.8 C)  SpO2: 91% 91%    Physical Exam: General:  no distress Lungs:  non labored Incisions:  right TMA viable with staples/suture in tact.  Right groin is clean and dry.   CBC    Component Value Date/Time   WBC 6.6 01/08/2022 0134   RBC 3.54 (L) 01/08/2022 0134   HGB 10.4 (L) 01/08/2022 0134   HCT 32.7 (L) 01/08/2022 0134   PLT 197 01/08/2022 0134   MCV 92.4 01/08/2022 0134   MCH 29.4 01/08/2022 0134   MCHC 31.8 01/08/2022 0134   RDW 12.7 01/08/2022 0134   LYMPHSABS 1.6 01/08/2022 0134   MONOABS 0.7 01/08/2022 0134   EOSABS 0.2 01/08/2022 0134   BASOSABS 0.0 01/08/2022 0134    BMET    Component Value Date/Time   NA 143 01/08/2022 0134   K 4.1 01/08/2022 0134   CL 109 01/08/2022 0134   CO2 27 01/08/2022 0134   GLUCOSE 132 (H) 01/08/2022 0134   BUN 24 (H) 01/08/2022 0134   CREATININE 1.05 01/08/2022 0134   CALCIUM 7.8 (L) 01/08/2022 0134   GFRNONAA >60 01/08/2022 0134   GFRAA >60 02/15/2019 0455    INR    Component Value Date/Time   INR 1.26 12/23/2018 0531     Intake/Output Summary (Last 24 hours) at 01/08/2022 0709 Last data filed at 01/07/2022 1835 Gross per 24 hour  Intake --  Output 375 ml  Net -375 ml     Assessment/Plan:  66 y.o. male is s/p:  Right-sided iliofemoral endarterectomy, bovine patch plasty Right-sided transmetatarsal amputation  7 Days Post-Op   -splint removed from pt's leg and TMA site appears viable.  Continue heel weight bearing only.  Right groin incision is clean and dry.  Discussed keeping incision dry by placing dry gauze in groin to wick moisture.  -DVT prophylaxis:  Lovenox -continue asa/statin/plavix  Doreatha Massed, PA-C [facility] [phone] 01/08/2022 7:09 AM  VASCULAR STAFF ADDENDUM: I have independently interviewed and examined the patient. I agree with the above.  TMA dressing removed. Some concern at the lateral aspect - where the previous ulceration was present. Tissue appears viable at this time. Heel weight-bearing only, daily dry dressing changes Transmetatarsal amputations have a high failure rate in pts with PVD, even higher in those with contralateral amputations.  Continue elevation and compression when not ambulating.    Fara Olden, MD [facility] Phone Number: [phone] 01/08/2022 8:22 AM

## 2022-01-08 NOTE — Progress Notes (Signed)
Inpatient Rehab Admissions Coordinator:  Saw pt at bedside. Informed him that received insurance authorization. Awaiting bed availability. Will continue to follow.  Wolfgang Phoenix, MS, CCC-SLP Admissions Coordinator [phone]

## 2022-01-08 NOTE — Progress Notes (Signed)
PT Cancellation Note  Patient Details Name: Antonio [name]. MRN: [id number] DOB: [birth year]   Cancelled Treatment:    Reason Eval/Treat Not Completed: Other (comment).  Pt is getting a bath, declined PT for now and will retry as time and pt allow.   [name] 01/08/2022, 2:00 PM  [name], PT PhD Acute Rehab Dept. Number: [facility] [id number] and [facility] [phone]

## 2022-01-08 NOTE — Progress Notes (Addendum)
PT Cancellation Note  Patient Details Name: Antonio [name]. MRN: [id number] DOB: May 05, 1956   Cancelled Treatment:    Reason Eval/Treat Not Completed: Other (comment).  Declined again, feels he is at near baseline with transfers and agreed to see PT in the AM.   [name] 01/08/2022, 3:51 PM  [name], PT PhD Acute Rehab Dept. Number: [facility] [id number] and [facility] [phone]

## 2022-01-08 NOTE — Progress Notes (Signed)
Occupational Therapy Treatment Patient Details Name: Antonio [name]. MRN: [id number] DOB: Oct 29, 1956 Today's Date: 01/08/2022   History of present illness 66 yo male presenting to ED on 2/2 with nonhealing R foot ulcer. s/p R iliofemoral endarterectomy and TMA on 2/6. PMH including HTN, diabetes, PAD s/p LSFA stenting, s/p left BKA (2019), and tobacco use disorder.   OT comments  Pt progressing towards acute OT goals. Focus of sessio was functional transfers. Pt able to clear hips/buttocks to squat pivot with min guard to min A. Full session details below. D/c recommendation remains appropriate.   Recommendations for follow up therapy are one component of a multi-disciplinary discharge planning process, led by the attending physician.  Recommendations may be updated based on patient status, additional functional criteria and insurance authorization.    Follow Up Recommendations  Acute inpatient rehab (3hours/day)    Assistance Recommended at Discharge Frequent or constant Supervision/Assistance  Patient can return home with the following  A lot of help with walking and/or transfers;A lot of help with bathing/dressing/bathroom;Assistance with cooking/housework   Equipment Recommendations  None recommended by OT    Recommendations for Other Services      Precautions / Restrictions Precautions Precautions: Fall Precaution Comments: s/p R TMA; prothesis for L prior BKA in room Restrictions Weight Bearing Restrictions: Yes RLE Weight Bearing: Partial weight bearing (WB through heel only) Other Position/Activity Restrictions: Per vascular sx note       Mobility Bed Mobility               General bed mobility comments: Sitting at EOB    Transfers Overall transfer level: Needs assistance   Transfers: Bed to chair/wheelchair/BSC     Squat pivot transfers: Min guard, Min assist       General transfer comment: Able to clear hips to squat pivot to each side.  EOB<>drop arm recliner.     Balance Overall balance assessment: Needs assistance Sitting-balance support: No upper extremity supported, Feet supported Sitting balance-Leahy Scale: Good         Standing balance comment: able to clear hips for squat pivot. Unable to attempt standing d/t WB through heel restriction on RLE and ill-fitting LLE prosthetic.                           ADL either performed or assessed with clinical judgement   ADL Overall ADL's : Needs assistance/impaired                         Toilet Transfer: Squat-pivot;Min guard;Minimal assistance Toilet Transfer Details (indicate cue type and reason): EOB to drop arm recliner. Assist for setup and min guard for safety. Able to squat pivot to left and right sides.           General ADL Comments: Donned LLE prosthetic sitting EOB then completed 2 squat pivots going to each side.    Extremity/Trunk Assessment Upper Extremity Assessment Upper Extremity Assessment: Generalized weakness;Overall [facility] for tasks assessed   Lower Extremity Assessment Lower Extremity Assessment: Defer to PT evaluation        Vision       Perception     Praxis      Cognition Arousal/Alertness: Awake/alert Behavior During Therapy: [facility] for tasks assessed/performed Overall Cognitive Status: No family/caregiver present to determine baseline cognitive functioning  General Comments: Pt removing [place] and inititally not allowing staff to check his O2 after transfer. Once agreeable SpO2 found to be 87. Inappropriate joking. Quickly alternates between "joking," upset, and at times pleasant. Gives conflicting reports at times. Decreased/disregard for safety awareness. Suspect he is at baseline.        Exercises      Shoulder Instructions       General Comments SpO2 87 on RA after transfers (pt removed O2). Nursing aware.    Pertinent Vitals/ Pain       Pain  Assessment Pain Assessment: Faces Faces Pain Scale: Hurts little more Pain Location: groin > RLE Pain Descriptors / Indicators: Discomfort Pain Intervention(s): Monitored during session, Limited activity within patient's tolerance, Repositioned  Home Living                                          Prior Functioning/Environment              Frequency  Min 3X/week        Progress Toward Goals  OT Goals(current goals can now be found in the care plan section)  Progress towards OT goals: Progressing toward goals  Acute Rehab OT Goals Patient Stated Goal: AIR then: "I'm getting out of here. I am going home." OT Goal Formulation: With patient Time For Goal Achievement: 01/16/22 Potential to Achieve Goals: Good ADL Goals Pt Will Perform Lower Body Dressing: with modified independence;sitting/lateral leans;bed level Pt Will Transfer to Toilet: with modified independence;with transfer board;bedside commode Pt Will Perform Toileting - Clothing Manipulation and hygiene: with modified independence;sitting/lateral leans  Plan Discharge plan remains appropriate    Co-evaluation                 AM-PAC OT "6 Clicks" Daily Activity     Outcome Measure   Help from another person eating meals?: None Help from another person taking care of personal grooming?: A Little Help from another person toileting, which includes using toliet, bedpan, or urinal?: A Little Help from another person bathing (including washing, rinsing, drying)?: A Little Help from another person to put on and taking off regular upper body clothing?: A Little Help from another person to put on and taking off regular lower body clothing?: A Little 6 Click Score: 19    End of Session    OT Visit Diagnosis: Unsteadiness on feet (R26.81);Other abnormalities of gait and mobility (R26.89);Muscle weakness (generalized) (M62.81);Pain   Activity Tolerance Patient tolerated treatment well    Patient Left in bed;with call bell/phone within reach;with nursing/sitter in room (sitting EOB)   Nurse Communication Other (comment) (Nurse present at end of session, RA (pt choice) with SpO2 87)        Time: 9798-9211 OT Time Calculation (min): 36 min  Charges: OT General Charges $OT Visit: 1 Visit OT Treatments $Self Care/Home Management : 23-37 mins  Raynald Kemp, OT Acute Rehabilitation Services Office: [phone]   Pilar Grammes 01/08/2022, 12:56 PM

## 2022-01-08 NOTE — Progress Notes (Signed)
Subjective: I seen and evaluated Antonio [name] at bedside. He denies any complaints at this time.  Objective:  Vital signs in last 24 hours: Vitals:   01/07/22 1108 01/07/22 2354 01/08/22 0432 01/08/22 0832  BP: (!) 157/71 128/67 (!) 148/68 (!) 152/66  Pulse: 65 82 76 62  Resp: 19 18 18 18   Temp: 98.3 F (36.8 C) 98.6 F (37 C) 98.2 F (36.8 C) 98 F (36.7 C)  TempSrc: Oral Oral Oral Oral  SpO2: 94% 91% 91% 92%  Weight:      Height:       Physical Exam Constitutional:      General: He is awake.  Cardiovascular:     Rate and Rhythm: Normal rate and regular rhythm.  Pulmonary:     Effort: Pulmonary effort is normal.     Breath sounds: Examination of the right-lower field reveals decreased breath sounds. Examination of the left-lower field reveals decreased breath sounds. Decreased breath sounds present.  Musculoskeletal:     Right lower leg: No edema.     Left lower leg: No edema.     Comments: Right lower leg neatly bandaged.      Left Lower Extremity: Left leg is amputated below knee.  Skin:    General: Skin is warm and dry.  Neurological:     General: No focal deficit present.     Mental Status: He is alert.  Psychiatric:        Behavior: Behavior is cooperative.     Assessment/Plan:  Principal Problem:   Osteomyelitis ([place]) Active Problems:   Type 2 diabetes mellitus with hyperglycemia, with long-term current use of insulin (HCC)   Hypertension   PVD (peripheral vascular disease) ([place])   Preoperative cardiovascular examination  Antonio [name]. is a 66 y.o. with a pertinent history of poorly controlled hypertension, type 2 diabetes, PAD status post left BKA in 2019 and tobacco use, who presented with 1 month of worsening right foot ulcer with acute leg pain at rest and admitted for further work-up and evaluation for osteomyelitis of the right fifth metatarsophalangeal joint.  Status post right iliofemoral endarterectomy and transmetatarsal amputation on  01/01/2022.  Doing well postoperatively and awaiting CIR placement.  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 Pt has had bowel movements since for the past several days, will cut back on bowel regimen. Vascular removed splint today and TMA appears viable. Otherwise, patient is medically stable and awaiting CIR placement. Pt continues to require O2 supplementation. NO sign of respiratory infection. CXR obtained recently reveal atelectasis. Pt encouraged to use bedside spirometer and continue working with PT. -Appreciate vascular surgery and orthopedics assistance -Continue aspirin, Plavix -Continue Dilaudid and Tylenol for pain -Decrease bowel regimen with frequent BMs. -Continue PT OT -No indication for antibiotics at this time  Hypertension Blood pressure has improved since admission, though still having some fluctuations. There is room to increase his irbesartan if remains hypertensive tomorrow. Electrolytes WNL and Cr levels are within reference range. --Continue amlodipine 10 mg daily, hydralazine 25 mg 3 times daily, carvedilol 25 twice daily, irbesartan 75 mg daily  Type 2 diabetes Glucose much improved on current insulin regimen. Morning fasting glucose levels at goal. -SSI 3 times daily with meals and nightly -Semglee 24 units nightly -Hypoglycemic protocol in place    Prior to Admission Living Arrangement: Anticipated Discharge Location: Barriers to Discharge: Dispo: Anticipated  discharge in approximately 1-2 day(s).   Timothy Lasso, MD 01/08/2022, 9:23 AM Pager: [phone] After 5pm on weekdays and 1pm on weekends: On Call pager [phone]

## 2022-01-09 ENCOUNTER — Encounter (HOSPITAL_COMMUNITY): Payer: Self-pay | Admitting: Physical Medicine and Rehabilitation

## 2022-01-09 ENCOUNTER — Inpatient Hospital Stay (HOSPITAL_COMMUNITY)
Admission: RE | Admit: 2022-01-09 | Discharge: 2022-01-23 | DRG: 560 | Disposition: A | Discharge status: Home / Self Care | Payer: Medicaid Other | Source: Intra-hospital | Attending: Physical Medicine and Rehabilitation | Admitting: Physical Medicine and Rehabilitation

## 2022-01-09 ENCOUNTER — Other Ambulatory Visit: Payer: Self-pay

## 2022-01-09 DIAGNOSIS — Z955 Presence of coronary angioplasty implant and graft: Secondary | ICD-10-CM

## 2022-01-09 DIAGNOSIS — Z833 Family history of diabetes mellitus: Secondary | ICD-10-CM

## 2022-01-09 DIAGNOSIS — Z794 Long term (current) use of insulin: Secondary | ICD-10-CM | POA: Diagnosis not present

## 2022-01-09 DIAGNOSIS — Z87891 Personal history of nicotine dependence: Secondary | ICD-10-CM | POA: Diagnosis not present

## 2022-01-09 DIAGNOSIS — Z4781 Encounter for orthopedic aftercare following surgical amputation: Principal | ICD-10-CM

## 2022-01-09 DIAGNOSIS — E1151 Type 2 diabetes mellitus with diabetic peripheral angiopathy without gangrene: Secondary | ICD-10-CM | POA: Diagnosis present

## 2022-01-09 DIAGNOSIS — Z880 Allergy status to penicillin: Secondary | ICD-10-CM

## 2022-01-09 DIAGNOSIS — E785 Hyperlipidemia, unspecified: Secondary | ICD-10-CM | POA: Diagnosis present

## 2022-01-09 DIAGNOSIS — E1165 Type 2 diabetes mellitus with hyperglycemia: Secondary | ICD-10-CM

## 2022-01-09 DIAGNOSIS — Z91041 Radiographic dye allergy status: Secondary | ICD-10-CM

## 2022-01-09 DIAGNOSIS — M24562 Contracture, left knee: Secondary | ICD-10-CM | POA: Diagnosis present

## 2022-01-09 DIAGNOSIS — Z8249 Family history of ischemic heart disease and other diseases of the circulatory system: Secondary | ICD-10-CM

## 2022-01-09 DIAGNOSIS — Z825 Family history of asthma and other chronic lower respiratory diseases: Secondary | ICD-10-CM

## 2022-01-09 DIAGNOSIS — Z7982 Long term (current) use of aspirin: Secondary | ICD-10-CM

## 2022-01-09 DIAGNOSIS — N179 Acute kidney failure, unspecified: Secondary | ICD-10-CM

## 2022-01-09 DIAGNOSIS — N5089 Other specified disorders of the male genital organs: Secondary | ICD-10-CM | POA: Diagnosis present

## 2022-01-09 DIAGNOSIS — Z7902 Long term (current) use of antithrombotics/antiplatelets: Secondary | ICD-10-CM | POA: Diagnosis not present

## 2022-01-09 DIAGNOSIS — Z89512 Acquired absence of left leg below knee: Secondary | ICD-10-CM | POA: Diagnosis not present

## 2022-01-09 DIAGNOSIS — Z79899 Other long term (current) drug therapy: Secondary | ICD-10-CM

## 2022-01-09 DIAGNOSIS — J810 Acute pulmonary edema: Secondary | ICD-10-CM | POA: Diagnosis not present

## 2022-01-09 DIAGNOSIS — E8770 Fluid overload, unspecified: Secondary | ICD-10-CM | POA: Diagnosis not present

## 2022-01-09 DIAGNOSIS — I2510 Atherosclerotic heart disease of native coronary artery without angina pectoris: Secondary | ICD-10-CM | POA: Diagnosis present

## 2022-01-09 DIAGNOSIS — I739 Peripheral vascular disease, unspecified: Secondary | ICD-10-CM

## 2022-01-09 DIAGNOSIS — Z89431 Acquired absence of right foot: Secondary | ICD-10-CM | POA: Diagnosis not present

## 2022-01-09 DIAGNOSIS — R0902 Hypoxemia: Secondary | ICD-10-CM

## 2022-01-09 DIAGNOSIS — I10 Essential (primary) hypertension: Secondary | ICD-10-CM | POA: Diagnosis present

## 2022-01-09 LAB — GLUCOSE, CAPILLARY
Glucose-Capillary: 119 mg/dL — ABNORMAL HIGH (ref 70–99)
Glucose-Capillary: 185 mg/dL — ABNORMAL HIGH (ref 70–99)
Glucose-Capillary: 138 mg/dL — ABNORMAL HIGH (ref 70–99)
Glucose-Capillary: 133 mg/dL — ABNORMAL HIGH (ref 70–99)

## 2022-01-09 LAB — BASIC METABOLIC PANEL
Anion gap: 7 (ref 5–15)
BUN: 26 mg/dL — ABNORMAL HIGH (ref 8–23)
CO2: 29 mmol/L (ref 22–32)
Calcium: 8 mg/dL — ABNORMAL LOW (ref 8.9–10.3)
Chloride: 107 mmol/L (ref 98–111)
Creatinine, Ser: 1.12 mg/dL (ref 0.61–1.24)
GFR, Estimated: >60 mL/min (ref >60)
Glucose, Bld: 127 mg/dL — ABNORMAL HIGH (ref 70–99)
Potassium: 4.1 mmol/L (ref 3.5–5.1)
Sodium: 143 mmol/L (ref 135–145)

## 2022-01-09 LAB — CBC WITH DIFFERENTIAL/PLATELET
Abs Immature Granulocytes: 0.06 10*3/uL (ref 0.00–0.07)
Basophils Absolute: 0 10*3/uL (ref 0.0–0.1)
Basophils Relative: 1 %
Eosinophils Absolute: 0.1 10*3/uL (ref 0.0–0.5)
Eosinophils Relative: 2 %
HCT: 34.2 % — ABNORMAL LOW (ref 39.0–52.0)
Hemoglobin: 10.5 g/dL — ABNORMAL LOW (ref 13.0–17.0)
Immature Granulocytes: 1 %
Lymphocytes Relative: 26 %
Lymphs Abs: 1.7 10*3/uL (ref 0.7–4.0)
MCH: 28.4 pg (ref 26.0–34.0)
MCHC: 30.7 g/dL (ref 30.0–36.0)
MCV: 92.4 fL (ref 80.0–100.0)
Monocytes Absolute: 0.6 10*3/uL (ref 0.1–1.0)
Monocytes Relative: 10 %
Neutro Abs: 4 10*3/uL (ref 1.7–7.7)
Neutrophils Relative %: 60 %
Platelets: 211 10*3/uL (ref 150–400)
RBC: 3.7 MIL/uL — ABNORMAL LOW (ref 4.22–5.81)
RDW: 12.8 % (ref 11.5–15.5)
WBC: 6.6 10*3/uL (ref 4.0–10.5)
nRBC: 0 % (ref 0.0–0.2)

## 2022-01-09 MED ORDER — HYDRALAZINE HCL 25 MG PO TABS
25.0000 mg | ORAL_TABLET | Freq: Three times a day (TID) | ORAL | Status: DC
  Administered 2022-01-09 – 2022-01-23 (×41): 25 mg via ORAL
  Filled 2022-01-09 (×40): qty 1

## 2022-01-09 MED ORDER — INSULIN GLARGINE-YFGN 100 UNIT/ML ~~LOC~~ SOLN
24.0000 [IU] | Freq: Every day | SUBCUTANEOUS | Status: DC
  Administered 2022-01-09 – 2022-01-22 (×14): 24 [IU] via SUBCUTANEOUS
  Filled 2022-01-09 (×15): qty 0.24

## 2022-01-09 MED ORDER — HYDROMORPHONE HCL 4 MG PO TABS: 4.0000 mg | ORAL_TABLET | ORAL | 0 refills | Status: DC | PRN

## 2022-01-09 MED ORDER — SENNOSIDES-DOCUSATE SODIUM 8.6-50 MG PO TABS
2.0000 | ORAL_TABLET | Freq: Two times a day (BID) | ORAL | Status: DC
  Administered 2022-01-09 – 2022-01-10 (×2): 2 via ORAL
  Filled 2022-01-09 (×4): qty 2

## 2022-01-09 MED ORDER — PANTOPRAZOLE SODIUM 40 MG PO TBEC
40.0000 mg | DELAYED_RELEASE_TABLET | Freq: Every day | ORAL | Status: DC
  Administered 2022-01-10 – 2022-01-23 (×14): 40 mg via ORAL
  Filled 2022-01-09 (×14): qty 1

## 2022-01-09 MED ORDER — ENOXAPARIN SODIUM 40 MG/0.4ML IJ SOSY: 40.0000 mg | PREFILLED_SYRINGE | INTRAMUSCULAR | Status: DC

## 2022-01-09 MED ORDER — IRBESARTAN 75 MG PO TABS
75.0000 mg | ORAL_TABLET | Freq: Every day | ORAL | Status: DC
  Administered 2022-01-10 – 2022-01-23 (×14): 75 mg via ORAL
  Filled 2022-01-09 (×14): qty 1

## 2022-01-09 MED ORDER — IRBESARTAN 75 MG PO TABS: 75.0000 mg | ORAL_TABLET | Freq: Every day | ORAL | 0 refills | Status: DC

## 2022-01-09 MED ORDER — HYDROMORPHONE HCL 2 MG PO TABS
4.0000 mg | ORAL_TABLET | ORAL | Status: DC | PRN
  Administered 2022-01-09 – 2022-01-17 (×45): 4 mg via ORAL
  Filled 2022-01-09 (×45): qty 2

## 2022-01-09 MED ORDER — DOCUSATE SODIUM 100 MG PO CAPS: 100.0000 mg | ORAL_CAPSULE | Freq: Every day | ORAL | Status: DC | PRN

## 2022-01-09 MED ORDER — CLOPIDOGREL BISULFATE 75 MG PO TABS
75.0000 mg | ORAL_TABLET | Freq: Every day | ORAL | Status: DC
  Administered 2022-01-10 – 2022-01-23 (×14): 75 mg via ORAL
  Filled 2022-01-09 (×14): qty 1

## 2022-01-09 MED ORDER — CARVEDILOL 25 MG PO TABS: 25.0000 mg | ORAL_TABLET | Freq: Two times a day (BID) | ORAL | 0 refills | Status: DC

## 2022-01-09 MED ORDER — AMLODIPINE BESYLATE 10 MG PO TABS: 10.0000 mg | ORAL_TABLET | Freq: Every day | ORAL | 0 refills | Status: DC

## 2022-01-09 MED ORDER — ALBUTEROL SULFATE (2.5 MG/3ML) 0.083% IN NEBU
2.5000 mg | INHALATION_SOLUTION | RESPIRATORY_TRACT | Status: DC | PRN
  Administered 2022-01-12 – 2022-01-17 (×5): 2.5 mg via RESPIRATORY_TRACT
  Filled 2022-01-09 (×5): qty 3

## 2022-01-09 MED ORDER — AMLODIPINE BESYLATE 10 MG PO TABS
10.0000 mg | ORAL_TABLET | Freq: Every day | ORAL | Status: DC
Start: 2022-01-10 — End: 2022-01-23
  Administered 2022-01-10 – 2022-01-23 (×14): 10 mg via ORAL
  Filled 2022-01-09 (×14): qty 1

## 2022-01-09 MED ORDER — ENOXAPARIN SODIUM 40 MG/0.4ML IJ SOSY
40.0000 mg | PREFILLED_SYRINGE | INTRAMUSCULAR | Status: DC
  Administered 2022-01-09 – 2022-01-22 (×14): 40 mg via SUBCUTANEOUS
  Filled 2022-01-09 (×14): qty 0.4

## 2022-01-09 MED ORDER — SENNOSIDES-DOCUSATE SODIUM 8.6-50 MG PO TABS: 2.0000 | ORAL_TABLET | Freq: Two times a day (BID) | ORAL | 0 refills | Status: DC

## 2022-01-09 MED ORDER — CLOPIDOGREL BISULFATE 75 MG PO TABS: 75.0000 mg | ORAL_TABLET | Freq: Every day | ORAL | 0 refills | Status: DC

## 2022-01-09 MED ORDER — INSULIN ASPART 100 UNIT/ML IJ SOLN
0.0000 [IU] | Freq: Three times a day (TID) | INTRAMUSCULAR | Status: DC
  Administered 2022-01-09 – 2022-01-11 (×3): 3 [IU] via SUBCUTANEOUS
  Administered 2022-01-11: 4 [IU] via SUBCUTANEOUS
  Administered 2022-01-12: 3 [IU] via SUBCUTANEOUS
  Administered 2022-01-12: 4 [IU] via SUBCUTANEOUS
  Administered 2022-01-12 – 2022-01-13 (×2): 3 [IU] via SUBCUTANEOUS
  Administered 2022-01-13 (×2): 4 [IU] via SUBCUTANEOUS
  Administered 2022-01-14 – 2022-01-17 (×7): 3 [IU] via SUBCUTANEOUS
  Administered 2022-01-17 – 2022-01-18 (×3): 4 [IU] via SUBCUTANEOUS
  Administered 2022-01-18: 3 [IU] via SUBCUTANEOUS
  Administered 2022-01-19: 4 [IU] via SUBCUTANEOUS
  Administered 2022-01-19: 7 [IU] via SUBCUTANEOUS
  Administered 2022-01-20: 4 [IU] via SUBCUTANEOUS
  Administered 2022-01-20 – 2022-01-21 (×3): 3 [IU] via SUBCUTANEOUS
  Administered 2022-01-22: 4 [IU] via SUBCUTANEOUS
  Administered 2022-01-22 – 2022-01-23 (×2): 3 [IU] via SUBCUTANEOUS

## 2022-01-09 MED ORDER — ASPIRIN EC 81 MG PO TBEC
81.0000 mg | DELAYED_RELEASE_TABLET | Freq: Every day | ORAL | Status: DC
  Administered 2022-01-10 – 2022-01-23 (×14): 81 mg via ORAL
  Filled 2022-01-09 (×14): qty 1

## 2022-01-09 MED ORDER — ACETAMINOPHEN 325 MG PO TABS
325.0000 mg | ORAL_TABLET | ORAL | Status: DC | PRN
  Administered 2022-01-14 – 2022-01-16 (×2): 650 mg via ORAL
  Filled 2022-01-09 (×2): qty 2

## 2022-01-09 MED ORDER — NYSTATIN 100000 UNIT/GM EX POWD
1.0000 "application " | Freq: Two times a day (BID) | CUTANEOUS | Status: DC
  Administered 2022-01-09 – 2022-01-23 (×18): 1 via TOPICAL
  Filled 2022-01-09: qty 15

## 2022-01-09 MED ORDER — INSULIN GLARGINE-YFGN 100 UNIT/ML ~~LOC~~ SOLN: 24.0000 [IU] | Freq: Every day | SUBCUTANEOUS | 11 refills | Status: DC

## 2022-01-09 MED ORDER — HYDRALAZINE HCL 25 MG PO TABS: 25.0000 mg | ORAL_TABLET | Freq: Three times a day (TID) | ORAL | 0 refills | Status: DC

## 2022-01-09 MED ORDER — ATORVASTATIN CALCIUM 40 MG PO TABS
40.0000 mg | ORAL_TABLET | Freq: Every day | ORAL | Status: DC
  Administered 2022-01-09 – 2022-01-22 (×14): 40 mg via ORAL
  Filled 2022-01-09 (×14): qty 1

## 2022-01-09 MED ORDER — CARVEDILOL 25 MG PO TABS
25.0000 mg | ORAL_TABLET | Freq: Two times a day (BID) | ORAL | Status: DC
Start: 2022-01-09 — End: 2022-01-23
  Administered 2022-01-09 – 2022-01-23 (×28): 25 mg via ORAL
  Filled 2022-01-09 (×28): qty 1

## 2022-01-09 NOTE — Progress Notes (Signed)
Inpatient Rehab Admissions Coordinator:  ° °I have a CIR bed for this Pt. Today. RN may call report to [phone]. ° °Arlena Marsan, MS, CCC-SLP °Rehab Admissions Coordinator  °[phone] (celll) °[phone] (office) ° °

## 2022-01-09 NOTE — TOC Transition Note (Signed)
Transition of Care (TOC) - CM/SW Discharge Note Marvetta Gibbons RN, BSN Transitions of Care Unit 4E- RN Case Manager See Treatment Team for direct phone #    Patient Details  Name: Antonio [name]. MRN: [id number] Date of Birth: June 26, 1956  Transition of Care [facility]) CM/SW Contact:  Antonio Patricia, RN Phone Number: 01/09/2022, 11:29 AM   Clinical Narrative:    TOC has been notified that pt has bed available on INPT rehab today. Pt has insurance auth for INPT rehab and is agreeable to rehab stay prior to return home.  MD team has cleared pt to transition to [place] rehab at [facility] later today.    Final next level of care: IP Rehab Facility Barriers to Discharge: No Barriers Identified   Patient Goals and CMS Choice Patient states their goals for this hospitalization and ongoing recovery are:: rehab then return home CMS Medicare.gov Compare Post Acute Care list provided to:: Patient Choice offered to / list presented to : Patient  Discharge Placement               INPT rehab- [facility]        Discharge Plan and Services   Discharge Planning Services: CM Consult, Follow-up appt scheduled Post Acute Care Choice: IP Rehab          DME Arranged: N/A DME Agency: NA       HH Arranged: NA HH Agency: NA        Social Determinants of Health (SDOH) Interventions     Readmission Risk Interventions Readmission Risk Prevention Plan 01/09/2022  Transportation Screening Complete  PCP or Specialist Appt within 5-7 Days (No Data)  Home Care Screening Complete  Medication Review (RN CM) Complete  Some recent data might be hidden

## 2022-01-09 NOTE — Discharge Summary (Signed)
Name: Antonio [name] [facility]. MRN: [id number] DOB: Oct 18, 1956 66 y.o. PCP: Imagene Riches, NP  Date of Admission: 12/28/2021  6:10 AM Date of Discharge: 01/09/22 Attending Physician: Lottie Mussel, MD  Discharge Diagnosis: 1. Osteomyelitis of the right fifth metatarsal and gangrene of second toe s/p R TMA 2. Severe PAD s/p Right iliofemoral endarterectomy 3. Hypertension 4. Type 2 diabetes with complication Discharge Medications: Allergies as of 01/09/2022       Reactions   Iodinated Contrast Media Hives, Rash, Other (See Comments)   Blisters Staph Blisters / staph   Penicillins Other (See Comments)   UNSPECIFIED REACTION  Has patient had a PCN reaction causing immediate rash, facial/tongue/throat swelling, SOB or lightheadedness with hypotension: No Has patient had a PCN reaction causing severe rash involving mucus membranes or skin necrosis: No Has patient had a PCN reaction that required hospitalization: No Has patient had a PCN reaction occurring within the last 10 years: No If all of the above answers are "NO", then may proceed with Cephalosporin use.        Medication List     STOP taking these medications    insulin detemir 100 UNIT/ML injection Commonly known as: LEVEMIR   oxyCODONE 5 MG immediate release tablet Commonly known as: Oxy IR/ROXICODONE       TAKE these medications    acetaminophen 500 MG tablet Commonly known as: TYLENOL Take 500 mg by mouth every 6 (six) hours as needed for moderate pain or headache.   amLODipine 10 MG tablet Commonly known as: NORVASC Take 1 tablet (10 mg total) by mouth daily. Start taking on: January 10, 2022   aspirin EC 81 MG tablet Take 81 mg by mouth daily.   atorvastatin 40 MG tablet Commonly known as: LIPITOR Take 1 tablet (40 mg total) by mouth daily at 6 PM.   carvedilol 25 MG tablet Commonly known as: COREG Take 1 tablet (25 mg total) by mouth 2 (two) times daily with a meal.   clopidogrel 75 MG  tablet Commonly known as: PLAVIX Take 1 tablet (75 mg total) by mouth daily. Start taking on: January 10, 2022   hydrALAZINE 25 MG tablet Commonly known as: APRESOLINE Take 1 tablet (25 mg total) by mouth every 8 (eight) hours.   HYDROmorphone 4 MG tablet Commonly known as: DILAUDID Take 1 tablet (4 mg total) by mouth every 4 (four) hours as needed for up to 7 days for severe pain.   insulin aspart 100 UNIT/ML injection Commonly known as: novoLOG Inject 0-20 Units into the skin 3 (three) times daily with meals.   insulin glargine-yfgn 100 UNIT/ML injection Commonly known as: SEMGLEE Inject 0.24 mLs (24 Units total) into the skin at bedtime.   irbesartan 75 MG tablet Commonly known as: AVAPRO Take 1 tablet (75 mg total) by mouth daily. Start taking on: January 10, 2022   senna-docusate 8.6-50 MG tablet Commonly known as: Senokot-S Take 2 tablets by mouth 2 (two) times daily.               Discharge Care Instructions  (From admission, onward)           Start     Ordered   01/09/22 0000  Discharge wound care:       Comments: Change dressing daily   01/09/22 1047            Disposition and follow-up:   [name]. was discharged from [facility] in Stable condition.  At the hospital follow up visit please address:  1.  Follow up with orthopedics  2.  Labs / imaging needed at time of follow-up: None  3.  Pending labs/ test needing follow-up: None  Follow-up Appointments:  Follow-up Information     Antonio [name], [place]. Go on 01/24/2022.   Specialty: Family Medicine Why: Hospital follow up- Time of appointment is 0910 am - please arrive 15 min early and bring insurance card- if you find another PCP in [place] then please call and cancel this appointment if not needed. Contact information: [place] [address] [address] [facility] Course by problem list: 1. Pt  presented with Right fifth metatarsal wound and was found to have osteomyelitis. ABIs of the bilateral lower extremities were obtained and significant calcifications noted bilaterally. Orthopedics and vascular were consulted and patient underwent right 5th metatarsal amputation and right iliofemoral endarterectomy. Procedure was successful. Infectious disease was consulted and No abx indicated. Pt well controlled on oral diluadid and tylenol. Post-op patient required new O2 requirement, needing 2L  to maintain >95%. CXR obtained revealing atelectasis. Pt remained afebrile and no leukocytosis to indicate infection. Due to immobilization and pt not utilizing spirometry at bedside, resultant atelectasis occurred. Pt was encouraged to work with PT/OT and spirometry for resolution. PT/OT recommended CIR. During hospitalization, patient was hypertensive SBP 170-160s, asymptomatic. Patient was restarted on home medications and ARB was added. The medications includes: amlodipine 10 mg daily, hydralazine 25 mg 3 times daily, carvedilol 25 twice daily, irbesartan 75 mg daily. Patients BP stabilized on this regimen. Patient glucose levels fluctuated throughout hospitalization, mostly in the 200s fasting, His long acting Semglee was titrated up, currently at 24 units at bedtime which adequately controlled morning fasting. Pt medically stable and ready for CIR transfer.  Discharge Exam:   BP (!) 149/66 (BP Location: Left Arm)    Pulse 61    Temp 97.8 F (36.6 C) (Oral)    Resp 19    Ht 6' (1.829 m)    Wt 93 kg    SpO2 96%    BMI 27.81 kg/m  Discharge exam: Physical Exam Constitutional:      General: He is awake. He is not in acute distress.    Interventions: Nasal cannula in place.  Cardiovascular:     Rate and Rhythm: Normal rate and regular rhythm.     Heart sounds: Normal heart sounds.  Pulmonary:     Effort: Pulmonary effort is normal.     Breath sounds: Normal air entry.  Musculoskeletal:     Left Lower  Extremity: Left leg is amputated below knee.  Skin:    General: Skin is warm and dry.  Neurological:     Mental Status: He is alert. Mental status is at baseline.  Psychiatric:        Behavior: Behavior normal. Behavior is cooperative.     Pertinent Labs, Studies, and Procedures:  CBC Latest Ref Rng & Units 01/09/2022 01/08/2022 01/07/2022  WBC 4.0 - 10.5 K/uL 6.6 6.6 6.7  Hemoglobin 13.0 - 17.0 g/dL 10.5(L) 10.4(L) 11.2(L)  Hematocrit 39.0 - 52.0 % 34.2(L) 32.7(L) 35.3(L)  Platelets 150 - 400 K/uL 211 197 211   BMP Latest Ref Rng & Units 01/09/2022 01/08/2022 01/07/2022  Glucose 70 - 99 mg/dL 127(H) 132(H) 160(H)  BUN 8 - 23 mg/dL 26(H) 24(H) 25(H)  Creatinine 0.61 - 1.24 mg/dL 1.12 1.05  0.94  Sodium 135 - 145 mmol/L 143 143 140  Potassium 3.5 - 5.1 mmol/L 4.1 4.1 4.1  Chloride 98 - 111 mmol/L 107 109 106  CO2 22 - 32 mmol/L 29 27 27   Calcium 8.9 - 10.3 mg/dL 8.0(L) 7.8(L) 7.8(L)   CT Angio Aortobifemoral W and/or Wo Contrast  Result Date: 12/28/2021 CLINICAL DATA:  66 year old male with history of peripheral artery disease presenting with right fifth metatarsal osteomyelitis. EXAM: CT ANGIOGRAPHY OF ABDOMINAL AORTA WITH ILIOFEMORAL RUNOFF TECHNIQUE: Multidetector CT imaging of the abdomen, pelvis and lower extremities was performed using the standard protocol during bolus administration of intravenous contrast. Multiplanar CT image reconstructions and MIPs were obtained to evaluate the vascular anatomy. RADIATION DOSE REDUCTION: This exam was performed according to the departmental dose-optimization program which includes automated exposure control, adjustment of the mA and/or kV according to patient size and/or use of iterative reconstruction technique. CONTRAST:  OMNIPAQUE IOHEXOL 350 MG/ML SOLN COMPARISON:  None. FINDINGS: VASCULAR Aorta: The abdominal aorta is normal in caliber and patent throughout. There is significant infrarenal fibrofatty and calcific atherosclerotic disease  resulting in approximately 50% focal luminal stenosis immediately inferior to the level of the renal arteries. Celiac: Patent without evidence of aneurysm, dissection, vasculitis or significant stenosis. SMA: Patent without evidence of aneurysm, dissection, vasculitis or significant stenosis. Renals: Dual bilateral renal arteries. The inferior right renal artery demonstrates focal high-grade ostial stenosis secondary to atherosclerotic plaque. The remaining renal arteries appear patent. IMA: Patent without evidence of aneurysm, dissection, vasculitis or significant stenosis. RIGHT Lower Extremity Inflow: Common, internal and external iliac arteries are patent. Near circumferential fibrofatty and calcific atherosclerotic changes with at least mild multifocal stenoses. Suggestion of severe focal stenosis at the level of the right inguinal ligament (series 4, image 131). Outflow: The common femoral artery is patent with circumferential atherosclerotic calcifications resulting in at least mild multifocal stenoses. The profunda is patent. The superficial femoral artery is patent throughout with near circumferential coarse atherosclerotic calcifications resulting in at least mild multifocal stenoses. The popliteal artery is patent throughout. Runoff: The anterior tibial artery is patent throughout. Focal short segment occlusion of the proximal tibioperoneal trunk with reconstitution via the peroneal artery which is patent to the level of the ankle. The posterior tibial artery appears occluded throughout. LEFT Lower Extremity Common iliac artery is patent. Severe ostial stenosis of the internal iliac artery secondary to atherosclerotic plaque. Coarse circumferential atherosclerotic plaque throughout the external iliac arteries [place] at least moderate multifocal stenoses. The left common femoral artery is patent. The profundus is patent. Chronic appearing total occlusion of indwelling left superficial femoral artery stent.  Review of the MIP images confirms the above findings. NON-VASCULAR Lower chest: No acute abnormality. Hepatobiliary: Mildly decreased attenuation of the hepatic parenchyma. Smooth contour normal size. No focal lesions. The gallbladder is present unremarkable. No intra or extrahepatic biliary ductal dilation. Pancreas: Unremarkable. No pancreatic ductal dilatation or surrounding inflammatory changes. Spleen: No splenic injury or perisplenic hematoma. Adrenals/Urinary Tract: Adrenal glands are unremarkable. Right lower pole anterior cortical simple renal cysts measuring approximately 13 mm in maximum axial diameter. Kidneys are otherwise normal, without renal calculi, focal lesion, or hydronephrosis. Bladder is unremarkable. Stomach/Bowel: Stomach is within normal limits. Appendix is not definitively visualized. No evidence of bowel wall thickening, distention, or inflammatory changes. Lymphatic: No abdominopelvic lymphadenopathy. Reproductive: Prostate is unremarkable. Other: No abdominal wall hernia or abnormality. No abdominopelvic ascites. Musculoskeletal: Cortical destruction of the right fifth proximal middle, and distal phalanx compatible with known osteomyelitis.  Diffuse right lower extremity edema, most prominent below the knee. No evidence of subcutaneous gas or discrete fluid collections. Status post left below the knee amputation. No evidence acute fracture or malalignment. IMPRESSION: VASCULAR 1. Advanced aortoiliac atherosclerotic disease resulting in approximately 50% focal stenosis of the infrarenal abdominal aorta. 2. Severe focal stenosis of the distal right external iliac artery at the level of the inguinal ligament. Patent right lower extremity runoff vessels with near circumferential atherosclerotic calcifications. Proximal right tibioperoneal trunk occlusion with two vessel runoff to the level of the ankle via the anterior tibial and peroneal arteries. 3. Chronic occlusion of the indwelling left  superficial femoral artery stent. NON-VASCULAR 1. Cortical destruction of the right fifth phalanges in keeping with history of osteomyelitis. Diffuse right lower extremity edema, was prominent below the knee. 2. Mild hepatic steatosis. Ruthann Cancer, MD [facility] Radiology Electronically Signed   By: Ruthann Cancer M.D.   On: 12/28/2021 16:16   PERIPHERAL VASCULAR CATHETERIZATION  Result Date: 12/29/2021 DATE OF SERVICE: 12/29/2021  PATIENT:  [name].  66 y.o. male  PRE-OPERATIVE DIAGNOSIS:  Atherosclerosis of native arteries of right lower extremity causing diabetic foot infection; osteomyelitis  POST-OPERATIVE DIAGNOSIS:  Same  PROCEDURE:  1) US guided left common femoral artery access 2) Aortogram 3) Right lower extremity angiogram with second order cannulation (173mL total contrast) 4) Conscious sedation (33 minutes)  SURGEON:  Yevonne Aline. Stanford Breed, MD  ASSISTANT: none  ANESTHESIA:   none  ESTIMATED BLOOD LOSS: minimal  LOCAL MEDICATIONS USED:  LIDOCAINE  COUNTS: confirmed correct.  PATIENT DISPOSITION:  PACU - hemodynamically stable.  Delay start of Pharmacological VTE agent (>24hrs) due to surgical blood loss or risk of bleeding: no  INDICATION FOR PROCEDURE: Antonio [name]. is a 66 y.o. male with osteomyelitis of the right foot with imaging evidence of severe PAD. After careful discussion of risks, benefits, and alternatives the patient was offered angiography with possible intervention. The patient understood and wished to proceed.  OPERATIVE FINDINGS: Terminal aorta and iliac arteries: Diffuse disease, heavy calcification without significant stenosis  Right lower extremity: Common femoral artery: Heavily diseased with 2 areas of significant stenosis (greater than 75%). Profunda femoris artery: Heavily diseased.  Possible ostial stenosis limiting flow Superficial femoral artery: Heavily diseased.  Diffuse stenosis, but not greater than 50%.   1 area at Hunter's canal about 60 to 70% stenosis. Popliteal artery: Patent without significant stenosis in the above and behind knee segment.  1 area of significant stenosis above the anterior tibial artery (about 60%) Anterior tibial artery: Dominant tibial vessel.  This courses to the foot.  Generous tibial artery. Tibioperoneal trunk: Occluded Peroneal artery: Occluded until the mid calf where it reconstitutes. Posterior tibial artery: Occluded Pedal circulation: Disadvantaged.  Fills via the anterior tibial circulation.  GLASS score. Grade 3: expect 53% 1 year re-intervention rate; expect 69% 5 year re-intervention rate; expect 49% 5 year mortality; expect 61% rate of restenosis from open operation at 5 years; expect 83% rate of restenosis from endovascular procedure..  DESCRIPTION OF PROCEDURE: After identification of the patient in the pre-operative holding area, the patient was transferred to the operating room. The patient was positioned supine on the operating room table. Anesthesia was induced. The groins was prepped and draped in standard fashion. A surgical pause was performed confirming correct patient, procedure, and operative location.  The left groin was anesthetized with subcutaneous injection of 1% lidocaine. Using ultrasound guidance, the left common femoral  artery was accessed with micropuncture technique. Fluoroscopy was used to confirm cannulation over the femoral head. The 41F sheath was upsized to 23F.  A Benson wire was advanced into the distal aorta. Over the wire an omni flush catheter was advanced to the level of L2. Aortogram was performed - see above for details.  The right common iliac artery was selected with a Benson guidewire. The wire was advanced into the common femoral artery. Over the wire the omni flush catheter was advanced into the external iliac artery. Selective right lower extremity angiography was performed - see above for details.  The sheath was left in place to be  removed in the recovery area.  Conscious sedation was administered with the use of IV fentanyl and midazolam under continuous physician and nurse monitoring.  Heart rate, blood pressure, and oxygen saturation were continuously monitored.  Total sedation time was 33 minutes  Upon completion of the case instrument and sharps counts were confirmed correct. The patient was transferred to the PACU in good condition. I was present for all portions of the procedure.  PLAN: Severe, global burden of calcific atherosclerosis.  Distal external iliac and common femoral artery disease not amenable to stenting.  Diffuse femoral-popliteal disease with hemodynamically significant stenosis.  Anterior tibial artery and attractive target, but pedal outflow disadvantaged.  He is at high risk for limb loss.  Discussed the case with Dr. Unk Lightning.  I think a iliofemoral endarterectomy and femoral AT bypass is his best option we will plan to do this on Monday.  Check ABI.  Check vein mapping.  We will ask cardiology service to risk stratify for open vascular surgery.  Yevonne Aline. Stanford Breed, MD [facility] Phone Number: [phone] 12/29/2021 8:34 AM    DG CHEST PORT 1 VIEW  Result Date: 01/05/2022 CLINICAL DATA:  Hypoxia EXAM: PORTABLE CHEST 1 VIEW COMPARISON:  Portable exam 1332 hours compared to 02/04/2019 FINDINGS: Upper normal size of cardiac silhouette. Mediastinal contours and pulmonary vascularity normal. Atherosclerotic calcification aorta. Bibasilar atelectasis. Remaining lungs clear. No pleural effusion or pneumothorax. Bones demineralized. IMPRESSION: Bibasilar atelectasis. Aortic Atherosclerosis (ICD10-I70.0). Electronically Signed   By: Lavonia Dana M.D.   On: 01/05/2022 14:04   DG Foot Complete Right  Result Date: 12/28/2021 CLINICAL DATA:  Lateral foot ulceration. Ulceration is all over right foot. EXAM: RIGHT FOOT COMPLETE - 3+ VIEW COMPARISON:  Right foot radiographs 07/11/2018  FINDINGS: Postsurgical changes are again seen of amputation of the first ray to the proximal metatarsal shaft. Is irregularity of the soft tissues lateral to the fifth metatarsal head. In this region there is moderate cortical destruction of the base of the proximal phalanx and the distal aspect of the metatarsal head of the fifth ray. There is resultant mild medial subluxation of fifth toe proximal phalanx. There is again apparent attenuation of the distal aspect of the cuneiforms and cuboid and dorsal subluxation of the second through fifth metatarsals (and possibly the remaining base of the first metatarsal). IMPRESSION:: IMPRESSION: 1. Soft tissue wound lateral to the fifth metatarsal head with new cortical erosion on both sides of fifth metatarsophalangeal joint indicating osteomyelitis. 2. 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 3. No significant change in dorsal subluxation of the base of second through fifth metatarsals. Electronically Signed   By: Yvonne Kendall M.D.   On: 12/28/2021 08:08   CT ANGIO CHEST AORTA W/CM &/OR WO/CM  Result Date: 12/28/2021  CLINICAL DATA:  Leg claudication/ischemia. History of vascular disease. Clinical concern for aortic aneurysm. Ex-smoker. EXAM: CT ANGIOGRAPHY CHEST WITH CONTRAST TECHNIQUE: Multidetector CT imaging of the chest was performed using the standard protocol during bolus administration of intravenous contrast. Multiplanar CT image reconstructions and MIPs were obtained to evaluate the vascular anatomy. RADIATION DOSE REDUCTION: This exam was performed according to the departmental dose-optimization program which includes automated exposure control, adjustment of the mA and/or kV according to patient size and/or use of iterative reconstruction technique. CONTRAST:  145mL OMNIPAQUE IOHEXOL 350 MG/ML SOLN COMPARISON:  Portable chest dated 02/04/2019. Chest, abdomen and pelvis CT dated 11/28/2018 FINDINGS:  Cardiovascular: Mild atheromatous aortic calcifications. No aneurysm or dissection. Normal sized heart. Moderate plaque formation in the posterior aspect of the abdominal aorta at the level of the renal arteries, causing approximately 40% luminal stenosis. Mediastinum/Nodes: No enlarged mediastinal, hilar, or axillary lymph nodes. Thyroid gland, trachea, and esophagus demonstrate no significant findings. Lungs/Pleura: 4 mm left lower lobe anterior nodule abutting the major fissure, unchanged. This is compatible with a stable subpleural lymph node. No new nodules. Mild bilateral centrilobular bullous changes. Upper Abdomen: Diffuse low density of the liver. Musculoskeletal: Thoracic and lower cervical spine degenerative changes, including changes of DISH. Review of the MIP images confirms the above findings. IMPRESSION: 1. No aortic aneurysm or dissection. 2. Aortic atherosclerosis, as described above. 3. Mild changes of centrilobular emphysema. 4. Diffuse hepatic steatosis. Aortic Atherosclerosis (ICD10-I70.0) and Emphysema (ICD10-J43.9). Electronically Signed   By: Claudie Revering M.D.   On: 12/28/2021 14:57   VAS [place] ABI WITH/WO TBI  Result Date: 12/30/2021  LOWER EXTREMITY DOPPLER STUDY Patient Name:  Antonio WATSON Sookdeo JR.  Date of Exam:   12/29/2021 Medical Rec #: [id number]               Accession #:    [id number] Date of Birth: 02/03/1956               Patient Gender: M Patient Age:   84 years Exam Location:  [facility] Procedure:      VAS [place] ABI WITH/WO TBI Referring Phys: Jamelle Haring --------------------------------------------------------------------------------  Indications: Pre-op. Gangrene, peripheral artery disease, and left BKA and Right              great toe amputation. High Risk Factors: Hypertension, hyperlipidemia, Diabetes, past history of                    smoking, coronary artery disease.  Comparison Study: 12/29/2015 ABI- right=0.78, left=0.78 Performing Technologist: Maudry Mayhew  MHA, RVT, RDCS, RDMS  Examination Guidelines: A complete evaluation includes at minimum, Doppler waveform signals and systolic blood pressure reading at the level of bilateral brachial, anterior tibial, and posterior tibial arteries, when vessel segments are accessible. Bilateral testing is considered an integral part of a complete examination. Photoelectric Plethysmograph (PPG) waveforms and toe systolic pressure readings are included as required and additional duplex testing as needed. Limited examinations for reoccurring indications may be performed as noted.  ABI Findings: +---------+------------------+-----+-------------------+----------+  Right     Rt Pressure (mmHg) Index Waveform            Comment     +---------+------------------+-----+-------------------+----------+  Brachial  177                      triphasic                       +---------+------------------+-----+-------------------+----------+  PTA       122                0.69  monophasic                      +---------+------------------+-----+-------------------+----------+  DP        61                 0.34  dampened monophasic             +---------+------------------+-----+-------------------+----------+  Great Toe                                              amputation  +---------+------------------+-----+-------------------+----------+ +---------+------------------+-----+--------+----------+  Left      Lt Pressure (mmHg) Index Waveform Comment     +---------+------------------+-----+--------+----------+  Brachial  162                      biphasic             +---------+------------------+-----+--------+----------+  PTA                                         amputation  +---------+------------------+-----+--------+----------+  DP                                          amputation  +---------+------------------+-----+--------+----------+  Great Toe                                   amputation   +---------+------------------+-----+--------+----------+ +-------+-----------+-----------+------------+------------+  ABI/TBI Today's ABI Today's TBI Previous ABI Previous TBI  +-------+-----------+-----------+------------+------------+  Right   0.69        amputation  0.78                       +-------+-----------+-----------+------------+------------+  Left    BKA         BKA         0.78                       +-------+-----------+-----------+------------+------------+   Summary: Right: Resting right ankle-brachial index indicates moderate right lower extremity arterial disease.  *See table(s) above for measurements and observations.  Electronically signed by Harold Barban MD on 12/30/2021 at 5:30:31 PM.    Final    VAS [place] LOWER EXTREMITY SAPHENOUS VEIN MAPPING  Result Date: 12/30/2021 LOWER EXTREMITY VEIN MAPPING Patient Name:  Mohd. Wirts [facility].  Date of Exam:   12/29/2021 Medical Rec #: [id number]               Accession #:    [id number] Date of Birth: 1956-03-29               Patient Gender: M Patient Age:   53 years Exam Location:  [facility] Procedure:      VAS [place] LOWER EXTREMITY SAPHENOUS VEIN MAPPING Referring Phys: Jamelle Haring --------------------------------------------------------------------------------  Indications:  Pre-op Risk Factors: Hypertension, hyperlipidemia, Diabetes, past history of smoking,  coronary artery disease.  Comparison Study: No prior study Performing Technologist: Maudry Mayhew MHA, RDMS, RVT, RDCS  Examination Guidelines: A complete evaluation includes B-mode imaging, spectral Doppler, color Doppler, and power Doppler as needed of all accessible portions of each vessel. Bilateral testing is considered an integral part of a complete examination. Limited examinations for reoccurring indications may be performed as noted. +--------------+-----------+----------------------+--------------+-------------+   RT Diameter   RT Findings          GSV             LT Diameter    LT Findings         (cm)                                              (cm)                     +--------------+-----------+----------------------+--------------+-------------+       0.73                      Saphenofemoral          0.86                                                        Junction                                      +--------------+-----------+----------------------+--------------+-------------+       0.56                      Proximal thigh          0.66                     +--------------+-----------+----------------------+--------------+-------------+       0.47                        Mid thigh             0.26                     +--------------+-----------+----------------------+--------------+-------------+       0.41                       Distal thigh           0.24                     +--------------+-----------+----------------------+--------------+-------------+       0.40       branching           Knee               0.20                     +--------------+-----------+----------------------+--------------+-------------+       0.42       branching        Prox calf  not                                                                          visualized    +--------------+-----------+----------------------+--------------+-------------+       0.35       branching         Mid calf                            not                                                                          visualized    +--------------+-----------+----------------------+--------------+-------------+       0.38                       Distal calf                           not                                                                          visualized    +--------------+-----------+----------------------+--------------+-------------+       0.40                          Ankle                              not                                                                           visualized    +--------------+-----------+----------------------+--------------+-------------+ +----------------+-----------+---------------+----------------+--------------+  RT diameter (cm) RT Findings       SSV       LT Diameter (cm)  LT Findings    +----------------+-----------+---------------+----------------+--------------+        0.29                   Popliteal fossa                  not visualized  +----------------+-----------+---------------+----------------+--------------+        0.24                    Proximal calf  not visualized  +----------------+-----------+---------------+----------------+--------------+        0.34                      Mid calf                      not visualized  +----------------+-----------+---------------+----------------+--------------+        0.38        branching    Distal calf                    not visualized  +----------------+-----------+---------------+----------------+--------------+ Diagnosing physician: Harold Barban MD Electronically signed by Harold Barban MD on 12/30/2021 at 5:30:59 PM.    Final    ECHOCARDIOGRAM COMPLETE  Result Date: 12/31/2021    ECHOCARDIOGRAM REPORT   Patient Name:   Antonio [name]. Date of Exam: 12/31/2021 Medical Rec #:  [id number]              Height:       72.0 in Accession #:    [id number]             Weight:       205.0 lb Date of Birth:  1956-02-01              BSA:          2.153 m Patient Age:    68 years               BP:           160/85 mmHg Patient Gender: M                      HR:           61 bpm. Exam Location:  Inpatient Procedure: 2D Echo, Cardiac Doppler, Color Doppler and Intracardiac            Opacification Agent Indications:    Pre-operative evaluation  History:        Patient has prior history of Echocardiogram examinations, most                 recent 12/24/2018. CHF, Previous Myocardial Infarction and CAD;                 Risk Factors:Hypertension, Diabetes and Dyslipidemia. PAD. S/P                  remote PCI. Osteomyelitis.  Sonographer:    Clayton Lefort RDCS (AE) Referring Phys: [id number] Nadean Corwin HILTY  Sonographer Comments: Technically difficult study due to poor echo windows, suboptimal parasternal window and suboptimal subcostal window. Image acquisition challenging due to respiratory motion. Patient had received pain medication prior to echo. Attempts to get patient to control breathing unsuccessful. IMPRESSIONS  1. Left ventricular ejection fraction, by estimation, is 60 to 65%. The left ventricle has normal function.  2. Right ventricular systolic function is normal. The right ventricular size is normal. Tricuspid regurgitation signal is inadequate for assessing PA pressure.  3. The mitral valve is grossly normal. No evidence of mitral valve regurgitation.  4. The aortic valve was not well visualized. Aortic valve regurgitation is not visualized. No aortic stenosis is present.  5. The inferior vena cava not well visualized. Comparison(s): No significant change from prior study. FINDINGS  Left Ventricle: Windows are limited. EF appears normal. Left ventricular ejection fraction, by estimation, is 60 to 65%. The left ventricle has normal function. Definity contrast agent  was given IV to delineate the left ventricular endocardial borders. The left ventricular internal cavity size was normal in size. Right Ventricle: The right ventricular size is normal. Right vetricular wall thickness was not well visualized. Right ventricular systolic function is normal. Tricuspid regurgitation signal is inadequate for assessing PA pressure. Left Atrium: Left atrial size was normal in size. Right Atrium: Right atrial size was normal in size. Pericardium: Trivial pericardial effusion is present. Mitral Valve: The mitral valve is grossly normal. No evidence of mitral valve regurgitation. MV peak gradient, 3.1 mmHg. The mean mitral valve gradient is 1.0 mmHg. Tricuspid Valve: The tricuspid valve is not well  visualized. Tricuspid valve regurgitation is not demonstrated. Aortic Valve: The aortic valve was not well visualized. Aortic valve regurgitation is not visualized. No aortic stenosis is present. Aortic valve mean gradient measures 4.0 mmHg. Aortic valve peak gradient measures 7.4 mmHg. Aortic valve area, by VTI measures 2.47 cm. Pulmonic Valve: The pulmonic valve was not well visualized. Aorta: The aortic root and ascending aorta are structurally normal, with no evidence of dilitation. Venous: The inferior vena cava not well visualized. IAS/Shunts: No atrial level shunt detected by color flow Doppler.  LEFT VENTRICLE PLAX 2D LVIDd:         4.80 cm      Diastology LVIDs:         4.30 cm      LV e' medial:    6.31 cm/s LV PW:         1.50 cm      LV E/e' medial:  13.2 LV IVS:        1.50 cm      LV e' lateral:   10.10 cm/s LVOT diam:     2.30 cm      LV E/e' lateral: 8.2 LV SV:         78 LV SV Index:   36 LVOT Area:     4.15 cm  LV Volumes (MOD) LV vol d, MOD A2C: 149.0 ml LV vol d, MOD A4C: 147.0 ml LV vol s, MOD A2C: 65.6 ml LV vol s, MOD A4C: 56.2 ml LV SV MOD A2C:     83.4 ml LV SV MOD A4C:     147.0 ml LV SV MOD BP:      97.8 ml RIGHT VENTRICLE             IVC RV Basal diam:  3.40 cm     IVC diam: 2.10 cm RV S prime:     12.00 cm/s TAPSE (M-mode): 2.9 cm LEFT ATRIUM             Index        RIGHT ATRIUM           Index LA diam:        3.70 cm 1.72 cm/m   RA Area:     19.30 cm LA Vol (A2C):   58.7 ml 27.26 ml/m  RA Volume:   51.60 ml  23.97 ml/m LA Vol (A4C):   48.4 ml 22.48 ml/m LA Biplane Vol: 53.0 ml 24.62 ml/m  AORTIC VALVE AV Area (Vmax):    2.51 cm AV Area (Vmean):   2.35 cm AV Area (VTI):     2.47 cm AV Vmax:           136.00 cm/s AV Vmean:          95.600 cm/s AV VTI:  0.315 m AV Peak Grad:      7.4 mmHg AV Mean Grad:      4.0 mmHg LVOT Vmax:         82.30 cm/s LVOT Vmean:        54.000 cm/s LVOT VTI:          0.187 m LVOT/AV VTI ratio: 0.59  AORTA Ao Root diam: 3.60 cm MITRAL VALVE  MV Area (PHT): 3.45 cm    SHUNTS MV Area VTI:   2.14 cm    Systemic VTI:  0.19 m MV Peak grad:  3.1 mmHg    Systemic Diam: 2.30 cm MV Mean grad:  1.0 mmHg MV Vmax:       0.88 m/s MV Vmean:      49.4 cm/s MV Decel Time: 220 msec MV E velocity: 83.30 cm/s MV A velocity: 74.40 cm/s MV E/A ratio:  1.12 Landscape architect signed by Phineas Inches Signature Date/Time: 12/31/2021/2:34:28 PM    Final      Discharge Instructions: Discharge Instructions     Call MD for:  difficulty breathing, headache or visual disturbances   Complete by: As directed    Call MD for:  extreme fatigue   Complete by: As directed    Call MD for:  hives   Complete by: As directed    Call MD for:  persistant dizziness or light-headedness   Complete by: As directed    Call MD for:  persistant nausea and vomiting   Complete by: As directed    Call MD for:  redness, tenderness, or signs of infection (pain, swelling, redness, odor or green/yellow discharge around incision site)   Complete by: As directed    Call MD for:  severe uncontrolled pain   Complete by: As directed    Call MD for:  temperature >100.4   Complete by: As directed    Diet - low sodium heart healthy   Complete by: As directed    Discharge wound care:   Complete by: As directed    Change dressing daily   Increase activity slowly   Complete by: As directed        Signed: Timothy Lasso, MD 01/09/2022, 11:00 AM   Pager: [phone]

## 2022-01-09 NOTE — H&P (Signed)
Physical Medicine and Rehabilitation Admission H&P        Chief Complaint  Patient presents with   Skin Ulcer  : HPI: Antonio [name], Antonio [name]. 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  Per chart review lives with daughter.  1 level home.  Mostly uses an Transport planner for mobility.  Presented 12/28/2021 with ischemic right lower extremity.  Right foot films and imaging showed soft tissue wound lateral to the fifth metatarsal head with new cortical erosion on both sides of fifth metatarsal phalangeal joint indicating osteomyelitis.  Diagnostic angiography demonstrated single-vessel inline flow to the foot with significant microvascular disease from poorly controlled diabetes.  Patient underwent right sided iliofemoral endarterectomy and bovine patch 01/01/2022 per Dr. Virl Cagey as well as right side transmetatarsal amputation.  Patient remains on aspirin and Plavix as prior to admission.  Lovenox for DVT prophylaxis..  Therapy evaluations completed due to patient decreased functional mobility was admitted for a comprehensive rehab program.   Pt reports pain is a constant 8/10- but was more than "11/10" prior to surgery.  Was off the chart.  Voiding well- LBM 2 days ago- upset about taking awhile to get to [facility] so refusing to eat solid food- insists on soft food.      Review of Systems  Constitutional:  Negative for chills and fever.  HENT:  Negative for hearing loss.   Eyes:  Negative for blurred vision and double vision.  Respiratory:  Negative for cough and shortness of breath.   Cardiovascular:  Positive for leg swelling. Negative for chest pain and palpitations.  Gastrointestinal:  Positive for constipation. Negative for heartburn, nausea and vomiting.  Genitourinary:  Negative for dysuria, flank  pain and hematuria.  Musculoskeletal:  Positive for joint pain and myalgias.  Skin:  Negative for rash.  All other systems reviewed and are negative.     Past Medical History:  Diagnosis Date   Coronary artery disease     Diabetes mellitus without complication ([place])     Hyperlipidemia     Hypertension     Osteomyelitis ([place]) 03/25/2017    RT FOOT         Past Surgical History:  Procedure Laterality Date   AMPUTATION Right 03/27/2017    Procedure: 1st Ray Amputation Right Foot;  Surgeon: Newt Minion, MD;  Location: [place];  Service: Orthopedics;  Laterality: Right;   APPENDECTOMY       BELOW KNEE LEG AMPUTATION Left     CARDIAC CATHETERIZATION       CORONARY STENT INTERVENTION   2005   ENDARTERECTOMY Right 01/01/2022    Procedure: RIGHT ILEOFEMORAL ENDARTERECTOMY with PATCH ANGIOPLASTY;  Surgeon: Broadus John, MD;  Location: [facility] OR;  Service: Vascular;  Laterality: Right;   Great toe amputation right.       I & D EXTREMITY Left 12/30/2015    Procedure: IRRIGATION AND DEBRIDEMENT LEFT FOOT, TRANSMETATARSAL AMPUTATION WITH APPLICATION OF ANTIBIOTIC BEADS AND WOUND VAC;  Surgeon: Newt Minion, MD;  Location: [place];  Service: Orthopedics;  Laterality: Left;   IR GASTROSTOMY TUBE MOD SED   12/23/2018   LOWER EXTREMITY ANGIOGRAPHY Right 12/29/2021    Procedure: LOWER EXTREMITY ANGIOGRAPHY;  Surgeon: Cherre Robins, MD;  Location: [place] [facility] LAB;  Service: Cardiovascular;  Laterality: Right;   TONSILLECTOMY  TRACHEOSTOMY   11/2018   TRANSMETATARSAL AMPUTATION Right 01/01/2022    Procedure: RIGHT TRANSMETATARSAL AMPUTATION;  Surgeon: Broadus John, MD;  Location: [facility] OR;  Service: Vascular;  Laterality: Right;         Family History  Problem Relation Age of Onset   Diabetes Mother     Emphysema Father     CAD Sister 99        Died of MI after flu    Social History:  reports that he quit smoking about 3 years ago. His smoking use included cigarettes. He has a 70.00 pack-year  smoking history. He has never used smokeless tobacco. He reports current alcohol use. He reports that he does not use drugs. Allergies:       Allergies  Allergen Reactions   Iodinated Contrast Media Hives, Rash and Other (See Comments)      Blisters Staph Blisters / staph   Penicillins Other (See Comments)      UNSPECIFIED REACTION  Has patient had a PCN reaction causing immediate rash, facial/tongue/throat swelling, SOB or lightheadedness with hypotension: No Has patient had a PCN reaction causing severe rash involving mucus membranes or skin necrosis: No Has patient had a PCN reaction that required hospitalization: No Has patient had a PCN reaction occurring within the last 10 years: No If all of the above answers are "NO", then may proceed with Cephalosporin use.          Medications Prior to Admission  Medication Sig Dispense Refill   acetaminophen (TYLENOL) 500 MG tablet Take 500 mg by mouth every 6 (six) hours as needed for moderate pain or headache.       aspirin EC 81 MG tablet Take 81 mg by mouth daily.       atorvastatin (LIPITOR) 40 MG tablet Take 1 tablet (40 mg total) by mouth daily at 6 PM. (Patient not taking: Reported on 12/28/2021) 30 tablet 0   insulin aspart (NOVOLOG) 100 UNIT/ML injection Inject 0-20 Units into the skin 3 (three) times daily with meals. (Patient not taking: Reported on 12/28/2021) 10 mL 11   insulin detemir (LEVEMIR) 100 UNIT/ML injection Inject 0.15 mLs (15 Units total) into the skin 2 (two) times daily. (Patient not taking: Reported on 12/28/2021) 10 mL 11   oxyCODONE (OXY IR/ROXICODONE) 5 MG immediate release tablet Take 1 tablet (5 mg total) by mouth every 6 (six) hours as needed for severe pain. (Patient not taking: Reported on 12/28/2021) 10 tablet 0          Home: [place] expects to be discharged to:: Private residence Living Arrangements: Children, Parent Available Help at Discharge: Family, Available PRN/intermittently Type  of Home: House Home Access: Level entry Home Layout: Able to live on main level with bedroom/bathroom (pt lives in an apartment in basement) Bathroom Shower/Tub: Multimedia programmer: Handicapped height Bathroom Accessibility: Yes Home Equipment: Civil engineer, contracting, Transport planner, Engineer, manufacturing held shower head, Rollator (4 wheels), Wheelchair - power Additional Comments: Lives in the baseline of mother's home  Lives With: Daughter, Family   Functional History: Prior Function Prior Level of Function : Independent/Modified Independent   Functional Status:  Mobility: Bed Mobility General bed mobility comments: Sitting at EOB Transfers Overall transfer level: Needs assistance Equipment used: Sliding board, Rolling walker (2 wheels) Transfers: Sit to/from Stand, Bed to chair/wheelchair/BSC Sit to Stand: Mod assist, +2 physical assistance, From elevated surface, +2 safety/equipment Bed to/from chair/wheelchair/BSC transfer type:: Lateral/scoot transfer, Squat pivot Squat pivot transfers: Min assist, +  2 safety/equipment  Lateral/Scoot Transfers: Min guard, With slide board, +2 safety/equipment General transfer comment: Mod A +2 for power up into partial standing. Unable to achieve upright posture while also maintaining WB status (through heel only), x2 reps from EOB to RW. Min A +2 for squat pivot to w/c to L with arm rest off. Lateral scoot with Min guard A using sliding board to drop arm recliner. Ambulation/Gait General Gait Details: unable Wheelchair Mobility Wheelchair mobility: Yes Wheelchair propulsion: Both upper extremities, Both lower extermities (heel only on R) Wheelchair parts: Supervision/cueing Distance: 175 Wheelchair Assistance Details (indicate cue type and reason): Cues to navigate w/c with bil UE and bil lower extremities, using heel only on R. Extra time and x2 rest breaks due to fatigue. ABle to negotiate w/c in open hall and tight spaces, bumping only x2 obstacles and  readjusts without physical assistance.   ADL: ADL Overall ADL's : Needs assistance/impaired Eating/Feeding: Set up, Sitting Grooming: Set up, Sitting Upper Body Bathing: Set up, Supervision/ safety, Sitting Lower Body Bathing: Moderate assistance, +2 for physical assistance, +2 for safety/equipment, Sitting/lateral leans Upper Body Dressing : Set up, Supervision/safety, Sitting Lower Body Dressing: Min guard Lower Body Dressing Details (indicate cue type and reason): Pt donning his prothetic while sitting at EOB. Toilet Transfer: Moderate assistance, +2 for physical assistance, +2 for safety/equipment, Transfer board, Requires drop arm Toilet Transfer Details (indicate cue type and reason): Lateral scoot to recliner with Mod A +2 with prothetic donned on LLE. Poor safety awareness and sequencing Functional mobility during ADLs: Moderate assistance, +2 for physical assistance, +2 for safety/equipment (lateral scoot only) General ADL Comments: Pt sitting at EOB upon arrival having received pain medication. Pt performing lateral scoot to recliner   Cognition: Cognition Overall Cognitive Status: Within Functional Limits for tasks assessed Orientation Level: Oriented X4 Cognition Arousal/Alertness: Awake/alert Behavior During Therapy: WFL for tasks assessed/performed Overall Cognitive Status: Within Functional Limits for tasks assessed General Comments: Cues for safety. Attention span is poor, but likely baseline. Can be inappropriate in comments at times, needs cues to remain approprite, likely baseline.   Physical Exam: Blood pressure (!) 148/68, pulse 76, temperature 98.2 F (36.8 C), temperature source Oral, resp. rate 18, height 6' (1.829 m), weight 93 kg, SpO2 91 %. Physical Exam Vitals and nursing note reviewed.  Constitutional:      Comments: Pt has breathy, gravely, hoarse voice- sitting up in bed; wearing 2L O2 by [place]; R TMA/L BKA, NAD  HENT:     Head: Normocephalic and  atraumatic.     Comments: Throat has signs of healed over trach- smile equal    Right Ear: External ear normal.     Left Ear: External ear normal.     Nose: Nose normal. No congestion.     Mouth/Throat:     Mouth: Mucous membranes are dry.     Pharynx: Oropharynx is clear. No oropharyngeal exudate.  Eyes:     General:        Right eye: No discharge.        Left eye: No discharge.     Extraocular Movements: Extraocular movements intact.  Neck:     Comments: Old trach site Cardiovascular:     Rate and Rhythm: Normal rate and regular rhythm.     Heart sounds: Normal heart sounds. No murmur heard.   No gallop.  Pulmonary:     Comments: Hoarse voice, but CTA B/L- decreased at bases B/L  Abdominal:     Comments: Protuberant,  soft, NT; hypoactive BS  Genitourinary:    Comments: Scrotum moderately swollen- pt says was size of grapefruit- is now large navel orange Musculoskeletal:     Comments: L BKA healed well- no redness R TMA- in ACE wrap Lacking 30 degrees of L knee extension- contracture- painful to try and extend further UE 5/5  B/L  RLE- HF/KE/KF 5/5; NT distally LLE- HF 5/5- cannot test knee due to contracture  Skin:    Comments: Right TMA dressing in place.  Appropriately tender- covered with ACE wrap Significant Clubbing of fingernails B/L  PICC in R upper arm- signs of blood outside PICC/surrounding it  Neurological:     Comments: Patient is alert.  No acute distress and follows commands.  Oriented x3. Intact to light touch I UE however decreased below knees B/L   Psychiatric:        Mood and Affect: Mood normal.        Behavior: Behavior normal.      Lab Results Last 48 Hours        Results for orders placed or performed during the hospital encounter of 12/28/21 (from the past 48 hour(s))  Glucose, capillary     Status: None    Collection Time: 01/06/22  6:04 AM  Result Value Ref Range    Glucose-Capillary 98 70 - 99 mg/dL      Comment: Glucose reference range  applies only to samples taken after fasting for at least 8 hours.    Comment 1 Notify RN      Comment 2 Document in Chart    Glucose, capillary     Status: Abnormal    Collection Time: 01/06/22 11:13 AM  Result Value Ref Range    Glucose-Capillary 187 (H) 70 - 99 mg/dL      Comment: Glucose reference range applies only to samples taken after fasting for at least 8 hours.  Glucose, capillary     Status: None    Collection Time: 01/06/22  4:19 PM  Result Value Ref Range    Glucose-Capillary 84 70 - 99 mg/dL      Comment: Glucose reference range applies only to samples taken after fasting for at least 8 hours.  Glucose, capillary     Status: Abnormal    Collection Time: 01/06/22  9:01 PM  Result Value Ref Range    Glucose-Capillary 155 (H) 70 - 99 mg/dL      Comment: Glucose reference range applies only to samples taken after fasting for at least 8 hours.    Comment 1 Notify RN      Comment 2 Document in Chart    Glucose, capillary     Status: Abnormal    Collection Time: 01/07/22  6:31 AM  Result Value Ref Range    Glucose-Capillary 163 (H) 70 - 99 mg/dL      Comment: Glucose reference range applies only to samples taken after fasting for at least 8 hours.    Comment 1 Notify RN      Comment 2 Document in Chart    Basic metabolic panel     Status: Abnormal    Collection Time: 01/07/22  9:46 AM  Result Value Ref Range    Sodium 140 135 - 145 mmol/L    Potassium 4.1 3.5 - 5.1 mmol/L    Chloride 106 98 - 111 mmol/L    CO2 27 22 - 32 mmol/L    Glucose, Bld 160 (H) 70 - 99 mg/dL  Comment: Glucose reference range applies only to samples taken after fasting for at least 8 hours.    BUN 25 (H) 8 - 23 mg/dL    Creatinine, Ser 0.94 0.61 - 1.24 mg/dL    Calcium 7.8 (L) 8.9 - 10.3 mg/dL    GFR, Estimated >60 >60 mL/min      Comment: (NOTE) Calculated using the CKD-EPI Creatinine Equation (2021)      Anion gap 7 5 - 15      Comment: Performed at [facility] [address] [place], [place] [address]  CBC with Differential/Platelet     Status: Abnormal    Collection Time: 01/07/22  9:46 AM  Result Value Ref Range    WBC 6.7 4.0 - 10.5 K/uL    RBC 3.87 (L) 4.22 - 5.81 MIL/uL    Hemoglobin 11.2 (L) 13.0 - 17.0 g/dL    HCT 35.3 (L) 39.0 - 52.0 %    MCV 91.2 80.0 - 100.0 fL    MCH 28.9 26.0 - 34.0 pg    MCHC 31.7 30.0 - 36.0 g/dL    RDW 12.8 11.5 - 15.5 %    Platelets 211 150 - 400 K/uL    nRBC 0.0 0.0 - 0.2 %    Neutrophils Relative % 66 %    Neutro Abs 4.4 1.7 - 7.7 K/uL    Lymphocytes Relative 21 %    Lymphs Abs 1.4 0.7 - 4.0 K/uL    Monocytes Relative 10 %    Monocytes Absolute 0.7 0.1 - 1.0 K/uL    Eosinophils Relative 2 %    Eosinophils Absolute 0.1 0.0 - 0.5 K/uL    Basophils Relative 0 %    Basophils Absolute 0.0 0.0 - 0.1 K/uL    Immature Granulocytes 1 %    Abs Immature Granulocytes 0.07 0.00 - 0.07 K/uL      Comment: Performed at [facility] [address], [place],  [address]  Glucose, capillary     Status: Abnormal    Collection Time: 01/07/22 11:10 AM  Result Value Ref Range    Glucose-Capillary 152 (H) 70 - 99 mg/dL      Comment: Glucose reference range applies only to samples taken after fasting for at least 8 hours.  Glucose, capillary     Status: Abnormal    Collection Time: 01/07/22  4:03 PM  Result Value Ref Range    Glucose-Capillary 150 (H) 70 - 99 mg/dL      Comment: Glucose reference range applies only to samples taken after fasting for at least 8 hours.  Glucose, capillary     Status: Abnormal    Collection Time: 01/07/22  8:51 PM  Result Value Ref Range    Glucose-Capillary 113 (H) 70 - 99 mg/dL      Comment: Glucose reference range applies only to samples taken after fasting for at least 8 hours.  Basic metabolic panel     Status: Abnormal    Collection Time: 01/08/22  1:34 AM  Result Value Ref Range    Sodium 143 135 - 145 mmol/L    Potassium 4.1 3.5 - 5.1 mmol/L    Chloride 109 98 - 111 mmol/L    CO2  27 22 - 32 mmol/L    Glucose, Bld 132 (H) 70 - 99 mg/dL      Comment: Glucose reference range applies only to samples taken after fasting for at least 8 hours.    BUN 24 (H) 8 -  23 mg/dL    Creatinine, Ser 1.05 0.61 - 1.24 mg/dL    Calcium 7.8 (L) 8.9 - 10.3 mg/dL    GFR, Estimated >60 >60 mL/min      Comment: (NOTE) Calculated using the CKD-EPI Creatinine Equation (2021)      Anion gap 7 5 - 15      Comment: Performed at [facility] [address] [place], [place] [address]  CBC with Differential/Platelet     Status: Abnormal    Collection Time: 01/08/22  1:34 AM  Result Value Ref Range    WBC 6.6 4.0 - 10.5 K/uL    RBC 3.54 (L) 4.22 - 5.81 MIL/uL    Hemoglobin 10.4 (L) 13.0 - 17.0 g/dL    HCT 32.7 (L) 39.0 - 52.0 %    MCV 92.4 80.0 - 100.0 fL    MCH 29.4 26.0 - 34.0 pg    MCHC 31.8 30.0 - 36.0 g/dL    RDW 12.7 11.5 - 15.5 %    Platelets 197 150 - 400 K/uL    nRBC 0.0 0.0 - 0.2 %    Neutrophils Relative % 63 %    Neutro Abs 4.1 1.7 - 7.7 K/uL    Lymphocytes Relative 24 %    Lymphs Abs 1.6 0.7 - 4.0 K/uL    Monocytes Relative 10 %    Monocytes Absolute 0.7 0.1 - 1.0 K/uL    Eosinophils Relative 2 %    Eosinophils Absolute 0.2 0.0 - 0.5 K/uL    Basophils Relative 0 %    Basophils Absolute 0.0 0.0 - 0.1 K/uL    Immature Granulocytes 1 %    Abs Immature Granulocytes 0.06 0.00 - 0.07 K/uL      Comment: Performed at [facility] [address] [place], [place] [address]      Imaging Results (Last 48 hours)  No results found.         Blood pressure (!) 148/68, pulse 76, temperature 98.2 F (36.8 C), temperature source Oral, resp. rate 18, height 6' (1.829 m), weight 93 kg, SpO2 91 %.   Medical Problem List and Plan: 1. 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  Note specifies right TMA dressing to remain in place until 2/13 as well as history of left BKA 2019              -patient may  shower if cover R TMA/RLE             -ELOS/Goals: 7-10 days mod I-  2.  Antithrombotics: -DVT/anticoagulation:  Pharmaceutical: Lovenox             -antiplatelet therapy: Aspirin 81 mg daily and Plavix 75 mg daily 3. Pain Management: Dilaudid 4 mg every 4 hours as needed 4. Mood: Provide emotional support             -antipsychotic agents: N/A 5. Neuropsych: This patient is capable of making decisions on his own behalf. 6. Skin/Wound Care: Routine skin checks 7. Fluids/Electrolytes/Nutrition: Routine in and outs with follow-up chemistries 8.  Diabetes mellitus.  Hemoglobin A1c 11.3.  Semglee 24 units daily.  Check blood sugars before meals and at bedtime 9.  Hypertension.  Hydralazine 25 mg every 8 hours, Coreg 25 mg twice daily, Norvasc 10 mg daily, Avapro 75 mg daily.  Monitor with increased mobility 10.  Hyperlipidemia.  Lipitor 11.  CAD with stenting.  Continue aspirin and Plavix 12.  History of tobacco use.  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  13. L knee contracture- has BKA prosthesis- don't see a way to fix-  14. Scrotal swelling- since iliofemoral endarterectomy- will elevate.  15. New O2 requirement- will see if can wean off-  16. RUE PICC- will see if able to get out?   I have personally performed a face to face diagnostic evaluation of this patient and formulated the key components of the plan.  Additionally, I have personally reviewed laboratory data, imaging studies, as well as relevant notes and concur with the physician assistant's documentation above.   The patient's status has not changed from the original H&P.  Any changes in documentation from the acute care chart have been noted above.       Kolvin Sargeant Angiulli, PA-C 01/08/2022

## 2022-01-09 NOTE — PMR Pre-admission (Shared)
PMR Admission Coordinator Pre-Admission Assessment   Patient: Antonio [name]. is an 66 y.o., male MRN: [id number] DOB: 01-02-1956 Height: 6' (182.9 cm) Weight: 93 kg   Insurance Information HMO:     PPO:      PCP:      IPA:      80/20:      OTHER:  PRIMARY: [place] Medicaid UHC      Policy#: [id number]      Subscriber: patient CM Name: Daralene Milch      Phone#: [phone]     Fax#: [phone] Pre-Cert#: [id number] Received insurance authorization on 01/08/21. Pt approved for 7 days     Employer:  Benefits:  Phone #: [email or web address]     Name:  Eff. Date: 05/26/21-05/25/22     Deduct: does not have deductible      Out of Pocket Max: $0      Life Max: NA CIR: 100% coverage      SNF: 100% coverage Outpatient: 100% coverage (27 visits per cal year all therapy disciplines combined)     Co-Pay:  Home Health: 100% coverage      Co-Pay:  DME: 100% coverage     Co-Pay:  Providers: in-network SECONDARY:       Policy#:      Phone#:    Development worker, community:       Phone#:    The Actuary for patients in Inpatient Rehabilitation Facilities with attached Privacy Act [place] Records was provided and verbally reviewed with: N/A   Emergency Contact Information Contact Information       Name Relation Home Work Mobile    Bessie Daughter     [phone]    Robbins,David Relative     [phone]    Jeppsen,Robin       [phone]           Current Medical History  Patient Admitting Diagnosis: osteomyelitis, s/p iliofemoral endarterectomy and R TMA History of Present Illness: Pt is a 66 year old male with medical hx significant for: HTN, diabetes, PAD s/p LSFA stenting, s/p L BKA (2019), tobacco use disorder. Pt presented to hospital on 12/28/21 d/t c/o right foot ulcer, right leg pain and back pain. Pt reported chronic right leg pain worsening over past 1 month. Ulceration on lateral side of right foot noticed 1 month ago and has grown in  size, increased in pain and has malodorous discharge. X-ray revealed osteomyelitis at fifth metatarsal. Orthopedics consulted and rx 5th ray amputation. Pt had right lower extremity angiogram on 12/29/21 which determined severe, global burden of calcific atherosclerosis. Distal external iliac and common femoral artery disease not amenable to stenting. Pt had right iliofemoral endarterectomy with patch angioplasty and transmetatarsal foot amputation on 01/01/22. Therapy evaluations performed and CIR recommended d/t pt's deficits in functional mobility and ability to complete ADLs independently.      Patient's medical record from [facility] has been reviewed by the rehabilitation admission coordinator and physician.   Past Medical History      Past Medical History:  Diagnosis Date   Coronary artery disease     Diabetes mellitus without complication ([place])     Hyperlipidemia     Hypertension     Osteomyelitis ([place]) 03/25/2017    RT FOOT      Has the patient had major surgery during 100 days prior to admission? Yes   Family History   family history includes CAD (age of onset: 23) in his sister; Diabetes in  his mother; Emphysema in his father.   Current Medications   Current Facility-Administered Medications:    0.9 %  sodium chloride infusion, 500 mL, Intravenous, Once PRN, Dagoberto Ligas, PA-C   acetaminophen (TYLENOL) tablet 1,000 mg, 1,000 mg, Oral, Q6H, Christian, Rylee, MD, 1,000 mg at 01/08/22 1815   albuterol (PROVENTIL) (2.5 MG/3ML) 0.083% nebulizer solution 2.5 mg, 2.5 mg, Nebulization, Q4H PRN, Eveland, Matthew, PA-C   alum & mag hydroxide-simeth (MAALOX/MYLANTA) 200-200-20 MG/5ML suspension 15-30 mL, 15-30 mL, Oral, Q2H PRN, Eveland, Matthew, PA-C   amLODipine (NORVASC) tablet 10 mg, 10 mg, Oral, Daily, Dagoberto Ligas, PA-C, 10 mg at 01/08/22 8127   aspirin EC tablet 81 mg, 81 mg, Oral, Q0600, Dagoberto Ligas, PA-C, 81 mg at 01/08/22 5170   atorvastatin (LIPITOR) tablet  40 mg, 40 mg, Oral, q1800, Dagoberto Ligas, PA-C, 40 mg at 01/08/22 1815   carvedilol (COREG) tablet 25 mg, 25 mg, Oral, BID WC, Timothy Lasso, MD, 25 mg at 01/08/22 1815   clopidogrel (PLAVIX) tablet 75 mg, 75 mg, Oral, Daily, Timothy Lasso, MD, 75 mg at 01/08/22 0951   docusate sodium (COLACE) capsule 100 mg, 100 mg, Oral, Daily PRN, Timothy Lasso, MD   enoxaparin (LOVENOX) injection 40 mg, 40 mg, Subcutaneous, Q24H, Dagoberto Ligas, PA-C, 40 mg at 01/07/22 2226   hydrALAZINE (APRESOLINE) tablet 25 mg, 25 mg, Oral, Q8H, Timothy Lasso, MD, 25 mg at 01/08/22 1325   HYDROmorphone (DILAUDID) tablet 4 mg, 4 mg, Oral, Q4H PRN, Christian, Rylee, MD, 4 mg at 01/08/22 1815   insulin aspart (novoLOG) injection 0-20 Units, 0-20 Units, Subcutaneous, TID WC, Dagoberto Ligas, PA-C, 4 Units at 01/08/22 1813   insulin aspart (novoLOG) injection 0-5 Units, 0-5 Units, Subcutaneous, QHS, Dagoberto Ligas, PA-C, 1 Units at 01/03/22 2215   insulin glargine-yfgn (SEMGLEE) injection 24 Units, 24 Units, Subcutaneous, QHS, Timothy Lasso, MD, 24 Units at 01/07/22 2225   irbesartan (AVAPRO) tablet 75 mg, 75 mg, Oral, Daily, Timothy Lasso, MD, 75 mg at 01/08/22 0950   metoprolol tartrate (LOPRESSOR) injection 2-5 mg, 2-5 mg, Intravenous, Q2H PRN, Dagoberto Ligas, PA-C   nystatin (MYCOSTATIN/NYSTOP) topical powder, , Topical, BID, Dagoberto Ligas, PA-C, 1 Units at 01/07/22 2227   ondansetron (ZOFRAN) injection 4 mg, 4 mg, Intravenous, Q6H PRN, Eveland, Matthew, PA-C   pantoprazole (PROTONIX) EC tablet 40 mg, 40 mg, Oral, Daily, Dagoberto Ligas, PA-C, 40 mg at 01/08/22 0174   senna-docusate (Senokot-S) tablet 2 tablet, 2 tablet, Oral, BID, Timothy Lasso, MD, 2 tablet at 01/08/22 9449   Patients Current Diet:  Diet Order                  Diet heart healthy/carb modified Room service appropriate? Yes; Fluid consistency: Thin  Diet effective now                         Precautions /  Restrictions Precautions Precautions: Fall Precaution Comments: s/p R TMA; prothesis for L prior BKA in room Restrictions Weight Bearing Restrictions: Yes RLE Weight Bearing: Partial weight bearing (WB through heel only) Other Position/Activity Restrictions: Per vascular sx note    Has the patient had 2 or more falls or a fall with injury in the past year? No   Prior Activity Level Limited Community (1-2x/wk): drives, gets out of house 2x/week   Prior Functional Level Self Care: Did the patient need help bathing, dressing, using the toilet or eating? Independent   Indoor Mobility: Did the patient need assistance with walking from  room to room (with or without device)? Independent   Stairs: Did the patient need assistance with internal or external stairs (with or without device)? Independent   Functional Cognition: Did the patient need help planning regular tasks such as shopping or remembering to take medications? Independent   Patient Information Are you of Hispanic, Latino/a,or Spanish origin?: A. No, not of Hispanic, Latino/a, or Spanish origin What is your race?: A. White Do you need or want an interpreter to communicate with a doctor or health care staff?: 0. No   Patient's Response To:  Health Literacy and Transportation Is the patient able to respond to health literacy and transportation needs?: Yes Health Literacy - How often do you need to have someone help you when you read instructions, pamphlets, or other written material from your doctor or pharmacy?: Never In the past 12 months, has lack of transportation kept you from medical appointments or from getting medications?: No In the past 12 months, has lack of transportation kept you from meetings, work, or from getting things needed for daily living?: No   Development worker, international aid / [place] Devices/Equipment: CBG Meter, Wheelchair Home Equipment: Civil engineer, contracting, Transport planner, Engineer, manufacturing held shower head,  Rollator (4 wheels), Wheelchair - power   Prior Device Use: Indicate devices/aids used by the patient prior to current illness, exacerbation or injury? Motorized wheelchair or scooter and Location manager   Overall Cognitive Status: No family/caregiver present to determine baseline cognitive functioning Orientation Level: Oriented X4 General Comments: Pt removing [place] and inititally not allowing staff to check his O2 after transfer. Once agreeable SpO2 found to be 87. Inappropriate joking. Quickly alternates between "joking," upset, and at times pleasant. Gives conflicting reports at times. Decreased/disregard for safety awareness. Suspect he is at baseline.    Extremity Assessment (includes Sensation/Coordination)   Upper Extremity Assessment: Generalized weakness, Overall WFL for tasks assessed  Lower Extremity Assessment: Defer to PT evaluation RLE Deficits / Details: s/p TMA. Hip/knee WFL LLE Deficits / Details: BKA (baseline), knee extension lacking ~5-10 degrees from neutral     ADLs   Overall ADL's : Needs assistance/impaired Eating/Feeding: Set up, Sitting Grooming: Set up, Sitting Upper Body Bathing: Set up, Supervision/ safety, Sitting Lower Body Bathing: Moderate assistance, +2 for physical assistance, +2 for safety/equipment, Sitting/lateral leans Upper Body Dressing : Set up, Supervision/safety, Sitting Lower Body Dressing: Min guard Lower Body Dressing Details (indicate cue type and reason): Pt donning his prothetic while sitting at EOB. Toilet Transfer: Squat-pivot, Min guard, Minimal assistance Toilet Transfer Details (indicate cue type and reason): EOB to drop arm recliner. Assist for setup and min guard for safety. Able to squat pivot to left and right sides. Functional mobility during ADLs: Moderate assistance, +2 for physical assistance, +2 for safety/equipment (lateral scoot only) General ADL Comments: Donned LLE prosthetic sitting EOB then  completed 2 squat pivots going to each side.     Mobility   General bed mobility comments: Sitting at EOB     Transfers   Overall transfer level: Needs assistance Equipment used: Sliding board, Rolling walker (2 wheels) Transfers: Bed to chair/wheelchair/BSC Sit to Stand: Mod assist, +2 physical assistance, From elevated surface, +2 safety/equipment Bed to/from chair/wheelchair/BSC transfer type:: Squat pivot Squat pivot transfers: Min guard, Min assist  Lateral/Scoot Transfers: Min guard, With slide board, +2 safety/equipment General transfer comment: Able to clear hips to squat pivot to each side. EOB<>drop arm recliner.     Ambulation / Gait / Stairs /  Wheelchair Mobility   Ambulation/Gait General Gait Details: unable Product manager mobility: Yes Wheelchair propulsion: Both upper extremities, Both lower extermities (heel only on R) Wheelchair parts: Supervision/cueing Distance: [place] Details (indicate cue type and reason): Cues to navigate w/c with bil UE and bil lower extremities, using heel only on R. Extra time and x2 rest breaks due to fatigue. ABle to negotiate w/c in open hall and tight spaces, bumping only x2 obstacles and readjusts without physical assistance.     Posture / Balance Balance Overall balance assessment: Needs assistance Sitting-balance support: No upper extremity supported, Feet supported Sitting balance-Leahy Scale: Good Standing balance support: Bilateral upper extremity supported, During functional activity Standing balance-Leahy Scale: Poor Standing balance comment: able to clear hips for squat pivot. Unable to attempt standing d/t WB through heel restriction on RLE and ill-fitting LLE prosthetic.     Special needs/care consideration Oxygen 2L nasal cannula, Skin Ecchymosis: groin/bilateral; Amputation: toe/right; Cellulitis: leg/left; Cracking: foot/right; Surgical incision: groin/right; leg/right, and Diabetic  management novoLOG 0-20 units 3x daily at meals; novoLOG 0-5 units at bedtime; Semglee 24 units at bedtime    Previous Home Environment (from acute therapy documentation) Living Arrangements: Children, Parent  Lives With: Daughter, Family Available Help at Discharge: Family, Available PRN/intermittently Type of Home: House Home Layout: Able to live on main level with bedroom/bathroom (pt lives in an apartment in basement) Home Access: Level entry Bathroom Shower/Tub: Walk-in IT sales professional Toilet: Handicapped height Bathroom Accessibility: Yes How Accessible: Accessible via wheelchair, Accessible via walker [place]: No Additional Comments: Lives in the baseline of mother's home   Discharge Living Setting Plans for Discharge Living Setting: Patient's home Type of Home at Discharge: House Discharge Home Layout: Able to live on main level with bedroom/bathroom (pt lives in an apartment in basement) Discharge Home Access: Level entry Discharge Bathroom Shower/Tub: Walk-in shower Discharge Bathroom Toilet: Handicapped height Discharge Bathroom Accessibility: Yes How Accessible: Accessible via wheelchair, Accessible via walker Does the patient have any problems obtaining your medications?: Yes (Describe) (financial)   Social/Family/Support Systems Anticipated Caregiver: Minor Iden, daughter Anticipated Caregiver's Contact Information: [phone] Caregiver Availability: Intermittent Discharge Plan Discussed with Primary Caregiver: Yes Is Caregiver In Agreement with Plan?: Yes Does Caregiver/Family have Issues with Lodging/Transportation while Pt is in Rehab?: No   Goals Patient/Family Goal for Rehab: Mod I-Supevision: PT/OT Expected length of stay: 7-10 days Pt/Family Agrees to Admission and willing to participate: Yes Program Orientation Provided & Reviewed with Pt/Caregiver Including Roles  & Responsibilities: Yes   Decrease burden of Care through IP rehab  admission: NA   Possible need for SNF placement upon discharge: Not anticipated   Patient Condition: I have reviewed medical records from [facility], spoken with CM, and patient and daughter. I met with patient at the bedside and discussed via phone for inpatient rehabilitation assessment.  Patient will benefit from ongoing PT and OT, can actively participate in 3 hours of therapy a day 5 days of the week, and can make measurable gains during the admission.  Patient will also benefit from the coordinated team approach during an Inpatient Acute Rehabilitation admission.  The patient will receive intensive therapy as well as Rehabilitation physician, nursing, social worker, and care management interventions.  Due to safety, skin/wound care, disease management, medication administration, pain management, and patient education the patient requires 24 hour a day rehabilitation nursing.  The patient is currently Min G-Min A with mobility and basic ADLs.  Discharge setting and therapy post discharge  at home with home health is anticipated.  Patient has agreed to participate in the Acute Inpatient Rehabilitation Program and will admit today.   Preadmission Screen Completed By:  Bethel Born, 01/08/2022 6:30 PM ______________________________________________________________________   Discussed status with Dr. Dagoberto Ligas on 01/09/22 at 75 and received approval for admission today.   Admission Coordinator:  Bethel Born, CCC-SLP, time 1000/Date 01/09/22    Assessment/Plan: Diagnosis: Does the need for close, 24 hr/day Medical supervision in concert with the patient's rehab needs make it unreasonable for this patient to be served in a less intensive setting? Yes Co-Morbidities requiring supervision/potential complications: PAD; DM, HTN; old L BKA with new R TMA due to osteomyelitis Due to bladder management, bowel management, safety, skin/wound care, disease management, medication  administration, pain management, and patient education, does the patient require 24 hr/day rehab nursing? Yes Does the patient require coordinated care of a physician, rehab nurse, PT, OT, and SLP to address physical and functional deficits in the context of the above medical diagnosis(es)? Yes Addressing deficits in the following areas: balance, endurance, locomotion, strength, transferring, bathing, dressing, feeding, grooming, and toileting Can the patient actively participate in an intensive therapy program of at least 3 hrs of therapy 5 days a week? Yes The potential for patient to make measurable gains while on inpatient rehab is good Anticipated functional outcomes upon discharge from inpatient rehab: modified independent and supervision PT, modified independent and supervision OT, n/a SLP Estimated rehab length of stay to reach the above functional goals is: 7-10 days  Anticipated discharge destination: Home 10. Overall Rehab/Functional Prognosis: good     MD Signature:

## 2022-01-09 NOTE — Progress Notes (Signed)
Physical Therapy Treatment Patient Details Name: Antonio [name]. MRN: [id number] DOB: [birth year] Today's Date: 01/09/2022   History of Present Illness 66 yo male presenting to ED on 2/2 with nonhealing R foot ulcer. s/p R iliofemoral endarterectomy and TMA on 2/6. PMH including HTN, diabetes, PAD s/p LSFA stenting, s/p left BKA (2019), and tobacco use disorder.    PT Comments    Pt progressing well towards his physical therapy goals. Session focused on chair level strengthening exercises and transfer training. Pt performing stand pivot transfer to both right and left with L prosthetic donned. Decreased compliance with weightbearing precautions. Will benefit from AIR to address strengthening, transfer training, w/c mobility and ADL's.    Recommendations for follow up therapy are one component of a multi-disciplinary discharge planning process, led by the attending physician.  Recommendations may be updated based on patient status, additional functional criteria and insurance authorization.  Follow Up Recommendations  Acute inpatient rehab (3hours/day)     Assistance Recommended at Discharge PRN  Patient can return home with the following A little help with walking and/or transfers;A little help with bathing/dressing/bathroom;Assist for transportation;Help with stairs or ramp for entrance   Equipment Recommendations  Other (comment) (defer)    Recommendations for Other Services       Precautions / Restrictions Precautions Precautions: Fall Restrictions Weight Bearing Restrictions: Yes RLE Weight Bearing: Partial weight bearing RLE Partial Weight Bearing Percentage or Pounds:  (through heel only)     Mobility  Bed Mobility               General bed mobility comments: Sitting up EOB upon arrival    Transfers Overall transfer level: Needs assistance Equipment used: None Transfers: Bed to chair/wheelchair/BSC       Squat pivot transfers: Min guard      General transfer comment: Min guard for stand pivot transfer from bed > recliner towards right and recliner > bed towards left    Ambulation/Gait                   Stairs             Wheelchair Mobility    Modified Rankin (Stroke Patients Only)       Balance Overall balance assessment: Needs assistance Sitting-balance support: No upper extremity supported, Feet supported Sitting balance-Leahy Scale: Good                                      Cognition Arousal/Alertness: Awake/alert Behavior During Therapy: WFL for tasks assessed/performed Overall Cognitive Status: No family/caregiver present to determine baseline cognitive functioning                                 General Comments: Pt seems to be at baseline cognition based on my knowledge of him from a prior admission. Pt often joking and directing care. Lacks safety insight and awareness of deficits        Exercises General Exercises - Lower Extremity Long Arc Quad: Both, 10 reps, Seated Hip Flexion/Marching: 10 reps, Both, Seated Other Exercises Other Exercises: Chair push ups x 10    General Comments        Pertinent Vitals/Pain Pain Assessment Pain Assessment: Faces Faces Pain Scale: Hurts a little bit Pain Location: discomfort from scrotal edema Pain Descriptors / Indicators: Discomfort Pain Intervention(s): Monitored during  session    Home Living                          Prior Function            PT Goals (current goals can now be found in the care plan section) Acute Rehab PT Goals Patient Stated Goal: to go home Potential to Achieve Goals: Good Progress towards PT goals: Progressing toward goals    Frequency    Min 3X/week      PT Plan Current plan remains appropriate    Co-evaluation              AM-PAC PT "6 Clicks" Mobility   Outcome Measure  Help needed turning from your back to your side while in a flat bed  without using bedrails?: None Help needed moving from lying on your back to sitting on the side of a flat bed without using bedrails?: A Little Help needed moving to and from a bed to a chair (including a wheelchair)?: A Little Help needed standing up from a chair using your arms (e.g., wheelchair or bedside chair)?: A Little Help needed to walk in hospital room?: Total Help needed climbing 3-5 steps with a railing? : Total 6 Click Score: 15    End of Session Equipment Utilized During Treatment: Gait belt;Oxygen Activity Tolerance: Patient tolerated treatment well Patient left: with call bell/phone within reach;in bed Nurse Communication: Mobility status PT Visit Diagnosis: Other abnormalities of gait and mobility (R26.89);Unsteadiness on feet (R26.81);Muscle weakness (generalized) (M62.81);Difficulty in walking, not elsewhere classified (R26.2)     Time: [id number] PT Time Calculation (min) (ACUTE ONLY): 26 min  Charges:  $Therapeutic Activity: 23-37 mins                     Wyona Almas, PT, DPT Acute Rehabilitation Services Pager [phone] Office [phone]    Deno Etienne 01/09/2022, 3:07 PM

## 2022-01-09 NOTE — Progress Notes (Addendum)
Inpatient Rehabilitation Admission Medication Review by a Pharmacist  A complete drug regimen review was completed for this patient to identify any potential clinically significant medication issues.  High Risk Drug Classes Is patient taking? Indication by Medication  Antipsychotic No   Anticoagulant Yes Enoxaparin for VTE prophylaxis  Antibiotic Yes Nystatin powder for fungal infection  Opioid Yes Hydromorphone PRN pain  Antiplatelet Yes Clopidogrel, ASA for PAD, preservation of cardiac stents  Hypoglycemics/insulin Yes Semglee insulin, aspart insulin for hyperglycemia  Vasoactive Medication Yes Amlodipine, carvedilol, hydralazine, irbesartan for hypertension  Chemotherapy No   Other No      Type of Medication Issue Identified Description of Issue Recommendation(s)  Drug Interaction(s) (clinically significant)     Duplicate Therapy     Allergy     No Medication Administration End Date     Incorrect Dose     Additional Drug Therapy Needed     Significant med changes from prior encounter (inform family/care partners about these prior to discharge).    Other       Clinically significant medication issues were identified that warrant physician communication and completion of prescribed/recommended actions by midnight of the next day:  No  Name of provider notified for urgent issues identified:  N/A  Time spent performing this drug regimen review (minutes):  20  Vicki Mallet, PharmD, BCPS, [facility] Clinical Pharmacist 01/09/2022 4:34 PM

## 2022-01-09 NOTE — Progress Notes (Signed)
°  Progress Note    01/09/2022 11:10 AM 8 Days Post-Op  Subjective:  no complaints  Afebrile HR 70's-80's  120's-140's systolic 91% [place]  Vitals:   01/09/22 0257 01/09/22 0807  BP: (!) 132/59 (!) 149/66  Pulse: 60 61  Resp: 18 19  Temp: 97.7 F (36.5 C) 97.8 F (36.6 C)  SpO2: 96% 96%    Physical Exam: General:  no distress Lungs:  non labored Incisions:  right TMA with staples/suture in tact.  Right groin is clean and dry. Lateral aspect of TMA marginal   CBC    Component Value Date/Time   WBC 6.6 01/09/2022 0119   RBC 3.70 (L) 01/09/2022 0119   HGB 10.5 (L) 01/09/2022 0119   HCT 34.2 (L) 01/09/2022 0119   PLT 211 01/09/2022 0119   MCV 92.4 01/09/2022 0119   MCH 28.4 01/09/2022 0119   MCHC 30.7 01/09/2022 0119   RDW 12.8 01/09/2022 0119   LYMPHSABS 1.7 01/09/2022 0119   MONOABS 0.6 01/09/2022 0119   EOSABS 0.1 01/09/2022 0119   BASOSABS 0.0 01/09/2022 0119    BMET    Component Value Date/Time   NA 143 01/09/2022 0119   K 4.1 01/09/2022 0119   CL 107 01/09/2022 0119   CO2 29 01/09/2022 0119   GLUCOSE 127 (H) 01/09/2022 0119   BUN 26 (H) 01/09/2022 0119   CREATININE 1.12 01/09/2022 0119   CALCIUM 8.0 (L) 01/09/2022 0119   GFRNONAA >60 01/09/2022 0119   GFRAA >60 02/15/2019 0455    INR    Component Value Date/Time   INR 1.26 12/23/2018 0531     Intake/Output Summary (Last 24 hours) at 01/09/2022 1110 Last data filed at 01/09/2022 2376 Gross per 24 hour  Intake 1460 ml  Output 700 ml  Net 760 ml      Assessment/Plan:  66 y.o. male is s/p:  Right-sided iliofemoral endarterectomy, bovine patch plasty Right-sided transmetatarsal amputation  8 Days Post-Op  TMA dressing removed. Some concern at the lateral aspect - where the previous ulceration was present. Tissue appears viable at this time. No plans for return to OR. Should this break down, he knows he will require BKA v AKA Heel weight-bearing only, daily dry dressing changes with ACE  compression - edema will lead to breakdown.  Continue elevation and compression when not ambulating.   Pt needs sponge bath to right groin daily. Wound site looks good but caked with powder Pt medically ready for DC from vascular surgery perspective.  Will have close follow up in the office    J. Gillis Santa, MD [facility] Phone Number: [phone] 01/09/2022 11:10 AM

## 2022-01-10 LAB — COMPREHENSIVE METABOLIC PANEL WITH GFR
ALT: 16 U/L (ref 0–44)
AST: 14 U/L — ABNORMAL LOW (ref 15–41)
Albumin: 2.4 g/dL — ABNORMAL LOW (ref 3.5–5.0)
Alkaline Phosphatase: 80 U/L (ref 38–126)
Anion gap: 8 (ref 5–15)
BUN: 26 mg/dL — ABNORMAL HIGH (ref 8–23)
CO2: 28 mmol/L (ref 22–32)
Calcium: 8.1 mg/dL — ABNORMAL LOW (ref 8.9–10.3)
Chloride: 107 mmol/L (ref 98–111)
Creatinine, Ser: 1.02 mg/dL (ref 0.61–1.24)
GFR, Estimated: >60 mL/min
Glucose, Bld: 114 mg/dL — ABNORMAL HIGH (ref 70–99)
Potassium: 4 mmol/L (ref 3.5–5.1)
Sodium: 143 mmol/L (ref 135–145)
Total Bilirubin: 0.2 mg/dL — ABNORMAL LOW (ref 0.3–1.2)
Total Protein: 5.9 g/dL — ABNORMAL LOW (ref 6.5–8.1)

## 2022-01-10 LAB — CBC WITH DIFFERENTIAL/PLATELET
Abs Immature Granulocytes: 0.06 10*3/uL (ref 0.00–0.07)
Basophils Absolute: 0 10*3/uL (ref 0.0–0.1)
Basophils Relative: 0 %
Eosinophils Absolute: 0.2 10*3/uL (ref 0.0–0.5)
Eosinophils Relative: 2 %
HCT: 35.2 % — ABNORMAL LOW (ref 39.0–52.0)
Hemoglobin: 11.2 g/dL — ABNORMAL LOW (ref 13.0–17.0)
Immature Granulocytes: 1 %
Lymphocytes Relative: 18 %
Lymphs Abs: 1.4 10*3/uL (ref 0.7–4.0)
MCH: 28.9 pg (ref 26.0–34.0)
MCHC: 31.8 g/dL (ref 30.0–36.0)
MCV: 90.7 fL (ref 80.0–100.0)
Monocytes Absolute: 0.6 10*3/uL (ref 0.1–1.0)
Monocytes Relative: 8 %
Neutro Abs: 5.5 10*3/uL (ref 1.7–7.7)
Neutrophils Relative %: 71 %
Platelets: 217 10*3/uL (ref 150–400)
RBC: 3.88 MIL/uL — ABNORMAL LOW (ref 4.22–5.81)
RDW: 12.8 % (ref 11.5–15.5)
WBC: 7.7 10*3/uL (ref 4.0–10.5)
nRBC: 0 % (ref 0.0–0.2)

## 2022-01-10 LAB — GLUCOSE, CAPILLARY
Glucose-Capillary: 115 mg/dL — ABNORMAL HIGH (ref 70–99)
Glucose-Capillary: 87 mg/dL (ref 70–99)
Glucose-Capillary: 124 mg/dL — ABNORMAL HIGH (ref 70–99)
Glucose-Capillary: 90 mg/dL (ref 70–99)

## 2022-01-10 MED ORDER — HYDROMORPHONE HCL 2 MG PO TABS
4.0000 mg | ORAL_TABLET | Freq: Every day | ORAL | Status: DC
  Administered 2022-01-10: 4 mg via ORAL
  Filled 2022-01-10: qty 2

## 2022-01-10 NOTE — Discharge Instructions (Addendum)
Inpatient Rehab Discharge Instructions  Antonio [name] [facility]. Discharge date and time: No discharge date for patient encounter.   Activities/Precautions/ Functional Status: Activity: activity as tolerated Diet: diabetic diet Wound Care: Routine skin checks Functional status:  ___ No restrictions     ___ Walk up steps independently ___ 24/7 supervision/assistance   ___ Walk up steps with assistance ___ Intermittent supervision/assistance  ___ Bathe/dress independently ___ Walk with walker     __x_ Bathe/dress with assistance ___ Walk Independently    ___ Shower independently ___ Walk with assistance    ___ Shower with assistance ___ No alcohol     ___ Return to work/school ________  Special Instructions: No driving smoking or alcohol  Follow-up primary care provider 1 week while maintained on Lasix to monitor for blood work and check potassium    COMMUNITY REFERRALS UPON DISCHARGE:    HOME EXERCISE PROGRAM OFFERED OUTPATIENT REHAB BUT PATIENT FEELS NOT NEEDED. AWARE IF CHANGES HIS MIND TO CALL THIS WORKER  Medical Equipment/Items Ordered: HAS ALL NEEDED EQUIPMENT FROM PAST ADMISSIONS                                                 Agency/Supplier:NA    My questions have been answered and I understand these instructions. I will adhere to these goals and the provided educational materials after my discharge from the hospital.  Patient/Caregiver Signature _______________________________ Date __________  Clinician Signature _______________________________________ Date __________  Please bring this form and your medication list with you to all your follow-up doctor's appointments.

## 2022-01-10 NOTE — Progress Notes (Signed)
Inpatient Rehabilitation  Patient information reviewed and entered into eRehab system by Shiraz Bastyr M. Judea Fennimore, M.A., CCC/SLP, PPS Coordinator.  Information including medical coding, functional ability and quality indicators will be reviewed and updated through discharge.    

## 2022-01-10 NOTE — Progress Notes (Signed)
Pts CBG-87. Pt had gatorade and graham crackers at bedside and states will eat snack. Pt states he will take care of himself.

## 2022-01-10 NOTE — Evaluation (Signed)
Occupational Therapy Assessment and Plan  Patient Details  Name: Antonio Antonio [name]. MRN: [id number] Date of Birth: 1956-06-02  OT Diagnosis: abnormal posture, acute pain, pain in joint, and swelling of limb Rehab Potential: Rehab Potential (ACUTE ONLY): Good ELOS: 2 weeks   Today's Date: 01/10/2022 OT Individual Time: 2993-7169 OT Individual Time Calculation (min): 56 min     Hospital Problem: Principal Problem:   S/P transmetatarsal amputation of foot, right ([place])   Past Medical History:  Past Medical History:  Diagnosis Date   Coronary artery disease    Diabetes mellitus without complication ([place])    Hyperlipidemia    Hypertension    Osteomyelitis ([place]) 03/25/2017   RT FOOT   Past Surgical History:  Past Surgical History:  Procedure Laterality Date   AMPUTATION Right 03/27/2017   Procedure: 1st Ray Amputation Right Foot;  Surgeon: Antonio Minion, MD;  Location: [place];  Service: Orthopedics;  Laterality: Right;   APPENDECTOMY     BELOW KNEE LEG AMPUTATION Left    CARDIAC CATHETERIZATION     CORONARY STENT INTERVENTION  2005   ENDARTERECTOMY Right 01/01/2022   Procedure: RIGHT ILEOFEMORAL ENDARTERECTOMY with PATCH ANGIOPLASTY;  Surgeon: Antonio John, MD;  Location: [facility] OR;  Service: Vascular;  Laterality: Right;   Great toe amputation right.     I & D EXTREMITY Left 12/30/2015   Procedure: IRRIGATION AND DEBRIDEMENT LEFT FOOT, TRANSMETATARSAL AMPUTATION WITH APPLICATION OF ANTIBIOTIC BEADS AND WOUND VAC;  Surgeon: Antonio Minion, MD;  Location: [place];  Service: Orthopedics;  Laterality: Left;   IR GASTROSTOMY TUBE MOD SED  12/23/2018   LOWER EXTREMITY ANGIOGRAPHY Right 12/29/2021   Procedure: LOWER EXTREMITY ANGIOGRAPHY;  Surgeon: Antonio Robins, MD;  Location: [place] [facility];  Service: Cardiovascular;  Laterality: Right;   TONSILLECTOMY     TRACHEOSTOMY  11/2018   TRANSMETATARSAL AMPUTATION Right 01/01/2022   Procedure: RIGHT TRANSMETATARSAL AMPUTATION;  Surgeon:  Antonio John, MD;  Location: [facility] OR;  Service: Vascular;  Laterality: Right;    Assessment & Plan Clinical Impression:   Antonio Antonio [name], Antonio [name]. 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  Per chart review lives with daughter.  1 level home.  Mostly uses an Transport planner for mobility.  Presented 12/28/2021 with ischemic right lower extremity.  Right foot films and imaging showed soft tissue wound lateral to the fifth metatarsal head with new cortical erosion on both sides of fifth metatarsal phalangeal joint indicating osteomyelitis.  Diagnostic angiography demonstrated single-vessel inline flow to the foot with significant microvascular disease from poorly controlled diabetes.  Patient underwent right sided iliofemoral endarterectomy and bovine patch 01/01/2022 per Antonio Antonio [name] as well as right side transmetatarsal amputation.  Patient remains on aspirin and Plavix as prior to admission.  Lovenox for DVT prophylaxis..  Therapy evaluations completed due to patient decreased functional mobility was admitted for a comprehensive rehab program. Patient transferred to CIR on 01/09/2022 .    Patient currently requires mod A with basic self-care skills secondary to muscle weakness, decreased cardiorespiratoy endurance and decreased oxygen support, impaired timing and sequencing, decreased coordination, and decreased motor planning, and decreased standing balance, decreased postural control, decreased balance strategies, and difficulty maintaining precautions.  Prior to hospitalization, patient could complete all self-care with mod I.  Patient will benefit from skilled intervention to  increase independence with basic self-care skills and increase level of independence with iADL prior to discharge home with daughter and  mother.  Anticipate patient will require intermittent assist for higher level tasks however at the mod I level and follow up home health.  OT - End of Session Activity Tolerance: Tolerates < 10 min activity, no significant change in vital signs Endurance Deficit: Yes Endurance Deficit Description: fatigues quickly OT Assessment Rehab Potential (ACUTE ONLY): Good OT Barriers to Discharge: Home environment access/layout;Lack of/limited family support;Weight bearing restrictions;Wound Care OT Patient demonstrates impairments in the following area(s): Balance;Edema;Endurance;Motor;Nutrition;Pain;Safety OT Basic ADL's Functional Problem(s): Grooming;Bathing;Dressing;Toileting OT Advanced ADL's Functional Problem(s): Simple Meal Preparation OT Transfers Functional Problem(s): Toilet;Tub/Shower OT Additional Impairment(s): None OT Plan OT Intensity: Minimum of 1-2 x/day, 45 to 90 minutes OT Frequency: 5 out of 7 days OT Duration/Estimated Length of Stay: 2 weeks OT Treatment/Interventions: Balance/vestibular training;Discharge planning;Pain management;Self Care/advanced ADL retraining;Therapeutic Activities;UE/LE Coordination activities;Disease mangement/prevention;Functional mobility training;Patient/family education;Skin care/wound managment;Therapeutic Exercise;Community reintegration;DME/adaptive equipment instruction;UE/LE Strength taining/ROM;Wheelchair propulsion/positioning OT Self Feeding Anticipated Outcome(s): Mod I OT Basic Self-Care Anticipated Outcome(s): Mod I OT Toileting Anticipated Outcome(s): Mod I OT Bathroom Transfers Anticipated Outcome(s): Mod I OT Recommendation Patient destination: Home Follow Up Recommendations: Home health OT Equipment Recommended: To be determined   OT Evaluation Precautions/Restrictions  Precautions Precautions: Fall Precaution Comments: s/p R TMA; prothesis for L prior BKA in room Restrictions Weight Bearing Restrictions: Yes RLE Weight  Bearing: Partial weight bearing RLE Partial Weight Bearing Percentage or Pounds: heel only Other Position/Activity Restrictions: Per vascular sx note Home Living/Prior [place] expects to be discharged to:: Private residence Living Arrangements: Children, Parent Available Help at Discharge: Family, Available PRN/intermittently Type of Home: House Home Access: Level entry Home Layout: Able to live on main level with bedroom/bathroom (pt lives at the basement level) Bathroom Shower/Tub: Walk-in shower (small lip to enter) Biochemist, clinical: Handicapped height Bathroom Accessibility: Yes Additional Comments: Lives in the baseline of mother's home  Lives With: Daughter, Family IADL History Homemaking Responsibilities: Yes Meal Prep Responsibility: Primary Current License: Yes Mode of Transportation: Other (comment) (truck) Occupation: On disability, Retired Type of Occupation: previously self-employed, Teacher, adult education care Prior Function Level of Independence: [place] for independence, Independent with basic ADLs, Independent with transfers, Independent with homemaking with wheelchair  Able to [place]?: No Driving: Yes Vocation: On disability Leisure: Hobbies-no Vision Baseline Vision/History: 1 Wears glasses (reading only) Ability to See in Adequate Light: 0 Adequate Patient Visual Report: No change from baseline Vision Assessment?: No apparent visual deficits Perception  Perception: Within Functional Limits Praxis Praxis: Intact Cognition Arousal/Alertness: Awake/alert Orientation Level: Person;Place;Situation Person: Oriented Place: Oriented Situation: Oriented Year: 2023 Month: February Day of Week: Correct Memory: Appears intact Immediate Memory Recall: Sock;Blue;Bed Memory Recall Sock: Without Cue Memory Recall Blue: Without Cue Memory Recall Bed: Without Cue Awareness: Appears intact Problem Solving: Appears  intact Safety/Judgment: Appears intact Sensation Sensation Light Touch: Appears Intact Hot/Cold: Appears Intact Proprioception: Appears Intact Coordination Gross Motor Movements are Fluid and Coordinated: No Fine Motor Movements are Fluid and Coordinated: No Coordination and Movement Description: grossly uncooridinated due to WB status, decreased strength and balance Finger Nose Finger Test: mild dysmetria bilaterally, however WFL Motor  Motor Motor: Within Functional Limits Motor - Skilled Clinical Observations: debilitated from long hospital course  Trunk/Postural Assessment  Cervical Assessment Cervical Assessment: Within Functional Limits Thoracic Assessment Thoracic Assessment: Within Functional Limits Lumbar Assessment Lumbar Assessment: Within Functional Limits Postural Control Postural Control: Within Functional Limits  Balance Balance Balance Assessed: Yes Static Sitting Balance Static Sitting - Balance Support: No upper extremity supported Static Sitting - Level of Assistance: 6: Modified independent (Device/Increase time) Dynamic Sitting Balance Dynamic Sitting - Balance Support: No upper extremity supported Dynamic Sitting - Level of Assistance: 4: Min assist Extremity/Trunk Assessment RUE Assessment RUE Assessment: Within Functional Limits Active Range of Motion (AROM) Comments: WNL LUE Assessment LUE Assessment: Exceptions to [facility] Active Range of Motion (AROM) Comments: arthritis affecting pt's degree of motion <120 shoulder flexion General Strength Comments: [facility]  Care Tool Care Tool Self Care Eating   Eating Assist Level: Set up assist    Oral Care    Oral Care Assist Level: Set up assist    Bathing   Body parts bathed by patient: Right arm;Left arm;Chest;Abdomen;Front perineal area;Buttocks;Right upper leg;Left upper leg;Face Body parts bathed by helper: Right lower leg Body parts n/a: Left lower leg (amputation) Assist Level: Minimal Assistance -  Patient > 75%    Upper Body Dressing(including orthotics)   What is the patient wearing?: Pull over shirt   Assist Level: Minimal Assistance - Patient > 75%    Lower Body Dressing (excluding footwear)   What is the patient wearing?: Pants Assist for lower body dressing: Maximal Assistance - Patient 25 - 49%    Putting on/Taking off footwear   What is the patient wearing?: Orthosis (L prothesis) Assist for footwear: Set up assist       Care Tool Toileting Toileting activity   Assist for toileting: Maximal Assistance - Patient 25 - 49%     Care Tool Bed Mobility Roll left and right activity        Sit to lying activity        Lying to sitting on side of bed activity         Care Tool Transfers Sit to stand transfer        Chair/bed transfer         Toilet transfer   Assist Level: Moderate Assistance - Patient 50 - 74%     Care Tool Cognition  Expression of Ideas and Wants Expression of Ideas and Wants: 4. Without difficulty (complex and basic) - expresses complex messages without difficulty and with speech that is clear and easy to understand  Understanding Verbal and Non-Verbal Content Understanding Verbal and Non-Verbal Content: 3. Usually understands - understands most conversations, but misses some part/intent of message. Requires cues at times to understand   Memory/Recall Ability Memory/Recall Ability : Current season;That he or she is in a hospital/hospital unit   Refer to Care Plan for [place] 1 OT Short Term Goal 1 (Week 1): Pt will complete LB dressing via lateral leans with supervision OT Short Term Goal 2 (Week 1): Pt will complete sit > stand in prep for ADL with Max A OT Short Term Goal 3 (Week 1): Pt will complete pericare with CGA for increased independence with toileting self-care  Recommendations for other services: None    Skilled Therapeutic Intervention Skilled OT intervention completed with discussion on  POC, rehab goals and explanation of OT purpose. Pt received sitting EOB, agreeable to session. Pt c/o pain, with nursing in room to provide meds. Pt completed UB bathing with supervision, UB dressing with min A, LB bathing/dressing with mod A utilizing lateral lean technique. Pt able to recall his WB precautions, but with difficulty maintaining precautions during sit > stand attempt and transfers. Pt able to complete squat pivot  transfer to [facility] with mod A, and +1 assist for stabilizing BSC for safety. This OT updated pt's safety sheet for transfers to be completed at +2 assist, squat pivot for toileting. See caretool for further details on assist level with self-care tasks performed. Pt left seated EOB with bed alarm on and all needs in reach at end of session.   ADL ADL Eating: Set up Where Assessed-Eating: Edge of bed Grooming: Setup Where Assessed-Grooming: Edge of bed Upper Body Bathing: Supervision/safety Where Assessed-Upper Body Bathing: Edge of bed Lower Body Bathing: Minimal assistance Where Assessed-Lower Body Bathing: Edge of bed Upper Body Dressing: Minimal assistance Where Assessed-Upper Body Dressing: Edge of bed Lower Body Dressing: Moderate assistance Where Assessed-Lower Body Dressing: Edge of bed Toileting: Maximal assistance Where Assessed-Toileting: Bedside Commode Toilet Transfer: Moderate assistance Toilet Transfer Method: Squat pivot Toilet Transfer Equipment: Drop arm bedside commode Tub/Shower Transfer: Unable to assess Social research officer, government: Unable to assess Mobility  Bed Mobility Bed Mobility: Sit to Supine;Supine to Sit Supine to Sit: Contact Guard/Touching assist Sit to Supine: Contact Guard/Touching assist   Discharge Criteria: Patient will be discharged from OT if patient refuses treatment 3 consecutive times without medical reason, if treatment goals not met, if there is a change in medical status, if patient makes no progress towards goals or if  patient is discharged from hospital.  The above assessment, treatment plan, treatment alternatives and goals were discussed and mutually agreed upon: by patient  Jeremy Mclamb E Demmi Sindt 01/10/2022, 9:00 AM

## 2022-01-10 NOTE — Progress Notes (Signed)
PROGRESS NOTE   Subjective/Complaints:  Pt reports they brought him food "he won't eat"- upset about this.  Pain meds due at 9am- is taking around the clock per nursing.  Feels like something like a bug bit his ear- it's itching.     ROS:  Pt denies SOB, abd pain, CP, N/V/C/D, and vision changes   Objective:   No results found. Recent Labs    01/09/22 0119 01/10/22 0521  WBC 6.6 7.7  HGB 10.5* 11.2*  HCT 34.2* 35.2*  PLT 211 217   Recent Labs    01/09/22 0119 01/10/22 0521  NA 143 143  K 4.1 4.0  CL 107 107  CO2 29 28  GLUCOSE 127* 114*  BUN 26* 26*  CREATININE 1.12 1.02  CALCIUM 8.0* 8.1*    Intake/Output Summary (Last 24 hours) at 01/10/2022 1458 Last data filed at 01/10/2022 1100 Gross per 24 hour  Intake 370 ml  Output 475 ml  Net -105 ml        Physical Exam: Vital Signs Blood pressure 123/67, pulse (!) 55, temperature 97.7 F (36.5 C), temperature source Oral, resp. rate 18, height 6' (1.829 m), SpO2 91 %.    General: awake, alert, appropriate, sitting up in bed; gravely voice due to previous trach; NAD HENT: conjugate gaze; oropharynx dry- old trach scar on neck CV: slightly bradycardic rate; no JVD Pulmonary: a little coarse on left- no wheezing- cleared with coughing; Off O2 by [place] GI: soft, NT, (+)BS- protuberant vs distended Psychiatric: appropriate-  Neurological: Ox3 Genitourinary:    Comments: Scrotum moderately swollen- pt says was size of grapefruit- is now large navel orange- no change Musculoskeletal:     Comments: L BKA healed well- no redness R TMA- in ACE wrap Lacking 30 degrees of L knee extension- contracture- painful to try and extend further UE 5/5  B/L  RLE- HF/KE/KF 5/5; NT distally LLE- HF 5/5- cannot test knee due to contracture  Skin:    Comments: Right TMA dressing in place.  Appropriately tender- covered with ACE wrap Significant Clubbing of fingernails  B/L  PICC in R upper arm- signs of blood outside PICC/surrounding it  Neurological:     Comments: Patient is alert.  No acute distress and follows commands.  Oriented x3. Intact to light touch I UE however decreased below knees B/L   Assessment/Plan: 1. Functional deficits which require 3+ hours per day of interdisciplinary therapy in a comprehensive inpatient rehab setting. Physiatrist is providing close team supervision and 24 hour management of active medical problems listed below. Physiatrist and rehab team continue to assess barriers to discharge/monitor patient progress toward functional and medical goals  Care Tool:  Bathing    Body parts bathed by patient: Right arm, Left arm, Chest, Abdomen, Front perineal area, Buttocks, Right upper leg, Left upper leg, Face   Body parts bathed by helper: Right lower leg Body parts n/a: Left lower leg (amputation)   Bathing assist Assist Level: Minimal Assistance - Patient > 75%     Upper Body Dressing/Undressing Upper body dressing   What is the patient wearing?: Pull over shirt    Upper body assist Assist Level: Minimal Assistance -  Patient > 75%    Lower Body Dressing/Undressing Lower body dressing      What is the patient wearing?: Pants     Lower body assist Assist for lower body dressing: Maximal Assistance - Patient 25 - 49%     Toileting Toileting    Toileting assist Assist for toileting: Maximal Assistance - Patient 25 - 49%     Transfers Chair/bed transfer  Transfers assist  Chair/bed transfer activity did not occur: Safety/medical concerns  Chair/bed transfer assist level: Minimal Assistance - Patient > 75%     Locomotion Ambulation   Ambulation assist   Ambulation activity did not occur: Safety/medical concerns          Walk 10 feet activity   Assist  Walk 10 feet activity did not occur: Safety/medical concerns        Walk 50 feet activity   Assist Walk 50 feet with 2 turns activity  did not occur: Safety/medical concerns         Walk 150 feet activity   Assist Walk 150 feet activity did not occur: Safety/medical concerns         Walk 10 feet on uneven surface  activity   Assist Walk 10 feet on uneven surfaces activity did not occur: Safety/medical concerns         Wheelchair     Assist Is the patient using a wheelchair?: Yes Type of Wheelchair: Manual    Wheelchair assist level: Supervision/Verbal cueing Max wheelchair distance: 100 ft    Wheelchair 50 feet with 2 turns activity    Assist        Assist Level: Supervision/Verbal cueing   Wheelchair 150 feet activity     Assist      Assist Level: Moderate Assistance - Patient 50 - 74%   Blood pressure 123/67, pulse (!) 55, temperature 97.7 F (36.5 C), temperature source Oral, resp. rate 18, height 6' (1.829 m), SpO2 91 %.  Medical Problem List and Plan: 1. 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  Note specifies right TMA dressing to remain in place until 2/13 as well as history of left BKA 2019             -patient may  shower if cover R TMA/RLE             -ELOS/Goals: 7-10 days mod I-   First day of evaluations- PT and OT 2.  Antithrombotics: -DVT/anticoagulation:  Pharmaceutical: Lovenox             -antiplatelet therapy: Aspirin 81 mg daily and Plavix 75 mg daily 3. Pain Management: Dilaudid 4 mg every 4 hours as needed  2/15- will order 1x/day- daily scheduled pain meds at 7:30am so can participate in therapy.  4. Mood: Provide emotional support             -antipsychotic agents: N/A 5. Neuropsych: This patient is capable of making decisions on his own behalf. 6. Skin/Wound Care: Routine skin checks 7. Fluids/Electrolytes/Nutrition: Routine in and outs with follow-up chemistries 8.  Diabetes mellitus.  Hemoglobin A1c 11.3.  Semglee 24 units daily.  Check blood sugars before  meals and at bedtime  2/15- great control low 100s- con't regimen 9.  Hypertension.  Hydralazine 25 mg every 8 hours, Coreg 25 mg twice daily, Norvasc 10 mg daily, Avapro 75 mg daily.  Monitor with increased mobility  2/15- BP controlled on current regimen- con't 10.  Hyperlipidemia.  Lipitor 11.  CAD with stenting.  Continue aspirin and Plavix 12.  History of tobacco use.  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  13. L knee contracture- has BKA prosthesis- don't see a way to fix-  14. Scrotal swelling- since iliofemoral endarterectomy- will elevate.  15. New O2 requirement- will see if can wean off-  16. RUE PICC- will see if able to get out?      LOS: 1 days A FACE TO FACE EVALUATION WAS PERFORMED  Antonio [name] 01/10/2022, 2:58 PM

## 2022-01-10 NOTE — Progress Notes (Signed)
[facility] Individual Statement of Services  Patient Name:  Antonio [name].  Date:  01/10/2022  Welcome to the [facility].  Our goal is to provide you with an individualized program based on your diagnosis and situation, designed to meet your specific needs.  With this comprehensive rehabilitation program, you will be expected to participate in at least 3 hours of rehabilitation therapies Monday-Friday, with modified therapy programming on the weekends.  Your rehabilitation program will include the following services:  Physical Therapy (PT), Occupational Therapy (OT), 24 hour per day rehabilitation nursing, Therapeutic Recreaction (TR), Care Coordinator, Rehabilitation Medicine, Nutrition Services, and Pharmacy Services  Weekly team conferences will be held on wednesday to discuss your progress.  Your Inpatient Rehabilitation Care Coordinator will talk with you frequently to get your input and to update you on team discussions.  Team conferences with you and your family in attendance may also be held.  Expected length of stay: 14-16 Days  Overall anticipated outcome: Independent with transfer-min assist with ambulation  Depending on your progress and recovery, your program may change. Your Inpatient Rehabilitation Care Coordinator will coordinate services and will keep you informed of any changes. Your Inpatient Rehabilitation Care Coordinator's name and contact numbers are listed  below.  The following services may also be recommended but are not provided by the [facility] will be made to provide these services after discharge if needed.  Arrangements include referral to agencies that provide these services.  Your insurance has been verified to be:  [facility] Medicaid Your primary doctor is:  Levada Dy McClung-new  patient  Pertinent information will be shared with your doctor and your insurance company.  Inpatient Rehabilitation Care Coordinator:  Ovidio Kin, [place] or Emilia Beck  Information discussed with and copy given to patient by: Elease Hashimoto, 01/10/2022, 9:49 AM

## 2022-01-10 NOTE — Evaluation (Signed)
Physical Therapy Assessment and Plan  Patient Details  Name: Antonio [name]. MRN: [id number] Date of Birth: Dec 31, 1955  PT Diagnosis: Difficulty walking, Edema, Impaired sensation, and Muscle weakness Rehab Potential: Good ELOS: 14-16 days   Today's Date: 01/10/2022 PT Individual Time: 1660-6301 PT Individual Time Calculation (min): 77 min    Hospital Problem: Principal Problem:   S/P transmetatarsal amputation of foot, right ([place])   Past Medical History:  Past Medical History:  Diagnosis Date   Coronary artery disease    Diabetes mellitus without complication ([place])    Hyperlipidemia    Hypertension    Osteomyelitis ([place]) 03/25/2017   RT FOOT   Past Surgical History:  Past Surgical History:  Procedure Laterality Date   AMPUTATION Right 03/27/2017   Procedure: 1st Ray Amputation Right Foot;  Surgeon: Newt Minion, MD;  Location: [facility];  Service: Orthopedics;  Laterality: Right;   APPENDECTOMY     BELOW KNEE LEG AMPUTATION Left    CARDIAC CATHETERIZATION     CORONARY STENT INTERVENTION  2005   ENDARTERECTOMY Right 01/01/2022   Procedure: RIGHT ILEOFEMORAL ENDARTERECTOMY with PATCH ANGIOPLASTY;  Surgeon: Broadus John, MD;  Location: [facility] OR;  Service: Vascular;  Laterality: Right;   Great toe amputation right.     I & D EXTREMITY Left 12/30/2015   Procedure: IRRIGATION AND DEBRIDEMENT LEFT FOOT, TRANSMETATARSAL AMPUTATION WITH APPLICATION OF ANTIBIOTIC BEADS AND WOUND VAC;  Surgeon: Newt Minion, MD;  Location: [facility];  Service: Orthopedics;  Laterality: Left;   IR GASTROSTOMY TUBE MOD SED  12/23/2018   LOWER EXTREMITY ANGIOGRAPHY Right 12/29/2021   Procedure: LOWER EXTREMITY ANGIOGRAPHY;  Surgeon: Cherre Robins, MD;  Location: [facility];  Service: Cardiovascular;  Laterality: Right;   TONSILLECTOMY     TRACHEOSTOMY  11/2018   TRANSMETATARSAL AMPUTATION Right 01/01/2022   Procedure: RIGHT TRANSMETATARSAL AMPUTATION;  Surgeon: Broadus John, MD;  Location: [facility]  OR;  Service: Vascular;  Laterality: Right;    Assessment & Plan Clinical Impression: Antonio [name], Antonio [name]. 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  Per chart review lives with daughter.  1 level home.  Mostly uses an Transport planner for mobility.  Presented 12/28/2021 with ischemic right lower extremity.  Right foot films and imaging showed soft tissue wound lateral to the fifth metatarsal head with new cortical erosion on both sides of fifth metatarsal phalangeal joint indicating osteomyelitis.  Diagnostic angiography demonstrated single-vessel inline flow to the foot with significant microvascular disease from poorly controlled diabetes.  Patient underwent right sided iliofemoral endarterectomy and bovine patch 01/01/2022 per Dr. Virl Cagey as well as right side transmetatarsal amputation.  Patient remains on aspirin and Plavix as prior to admission.  Lovenox for DVT prophylaxis..  Therapy evaluations completed due to patient decreased functional mobility was admitted for a comprehensive rehab program.    Patient transferred to CIR on 01/09/2022 .   Patient currently requires min with mobility secondary to muscle weakness and decreased standing balance and decreased balance strategies.  Prior to hospitalization, patient was modified independent  with mobility and lived with Daughter, Family in a House home.  Home access is  Level entry.  Patient will benefit from skilled PT intervention to maximize safe functional mobility, minimize fall risk, and decrease caregiver burden for planned discharge home with intermittent assist.  Anticipate patient will benefit from follow up [place] at discharge.  PT - End of Session Activity Tolerance: Tolerates 30+ min activity with multiple rests Endurance Deficit: Yes Endurance  Deficit Description: fatigues quickly PT Assessment Rehab Potential (ACUTE/IP ONLY): Good PT Barriers to Discharge: Home environment access/layout;Wound Care;Lack of/limited family support;Weight bearing restrictions PT Patient demonstrates impairments in the following area(s): Pain;Behavior;Safety;Endurance;Skin Integrity;Sensory PT Transfers Functional Problem(s): Bed Mobility;Bed to Chair;Car PT Locomotion Functional Problem(s): Ambulation;Wheelchair Mobility PT Plan PT Intensity: Minimum of 1-2 x/day ,45 to 90 minutes PT Frequency: 5 out of 7 days PT Duration Estimated Length of Stay: 14-16 days PT Treatment/Interventions: Ambulation/gait training;Discharge planning;DME/adaptive equipment instruction;Functional mobility training;Pain management;Psychosocial support;Splinting/orthotics;Therapeutic Activities;UE/LE Strength taining/ROM;Wheelchair propulsion/positioning;UE/LE Coordination activities;Skin care/wound management;Patient/family education;Neuromuscular re-education;Stair training;Therapeutic Exercise;Functional electrical stimulation;Disease management/prevention;Community reintegration;Balance/vestibular training PT Transfers Anticipated Outcome(s): mod I PT Locomotion Anticipated Outcome(s): mod I w/c, min A therapeutic gait PT Recommendation Follow Up Recommendations: Home health PT Patient destination: Home Equipment Recommended: To be determined   PT Evaluation Precautions/Restrictions Precautions Precautions: Fall Precaution Comments: s/p R TMA; prothesis for L prior BKA in room, pt able Restrictions Weight Bearing Restrictions: Yes RLE Weight Bearing: Partial weight bearing RLE Partial Weight Bearing Percentage or Pounds: heel only Other Position/Activity Restrictions: Per vascular sx note General   Vital Signs Pain   Pain Interference Pain Interference Pain Effect on Sleep: 4. Almost constantly Pain Interference with Therapy Activities: 1. Rarely or not at  all Pain Interference with Day-to-Day Activities: 1. Rarely or not at all Home Living/Prior [place] Arrangements: Parent Available Help at Discharge: Family;Available PRN/intermittently Type of Home: House Home Access: Level entry Home Layout: Able to live on main level with bedroom/bathroom Additional Comments: Lives in the baseline of mother's home  Lives With: Daughter;Family Prior Function Level of Independence: Requires assistive device for independence;Independent with basic ADLs;Independent with transfers;Independent with homemaking with wheelchair  Able to Take Stairs?: No Driving: Yes Vocation: On disability Vision/Perception  Vision - History Ability to See in Adequate Light: 0 Adequate Perception Perception: Within Functional Limits Praxis Praxis: Intact  Cognition Overall Cognitive Status: No family/caregiver present to determine baseline cognitive functioning Arousal/Alertness: Awake/alert Orientation Level: Oriented X4 Year: 2023 Month: February Day of Week: Correct Memory: Appears intact Awareness: Appears intact Problem Solving: Appears intact Safety/Judgment: Appears intact Sensation Sensation Light Touch: Appears Intact Hot/Cold: Appears Intact Proprioception: Appears Intact Stereognosis: Not tested Coordination Gross Motor Movements are Fluid and Coordinated: No Fine Motor Movements are Fluid and Coordinated: No Coordination and Movement Description: grossly uncooridinated due to WB status, decreased strength and balance Motor  Motor Motor: Within Functional Limits Motor - Skilled Clinical Observations: debilitated from long hospital course   Trunk/Postural Assessment  Cervical Assessment Cervical Assessment: Within Functional Limits Thoracic Assessment Thoracic Assessment: Within Functional Limits Lumbar Assessment Lumbar Assessment: Within Functional Limits Postural Control Postural Control: Within Functional Limits   Balance Balance Balance Assessed: Yes Static Sitting Balance Static Sitting - Balance Support: No upper extremity supported Static Sitting - Level of Assistance: 6: Modified independent (Device/Increase time) Dynamic Sitting Balance Dynamic Sitting - Balance Support: No upper extremity supported Dynamic Sitting - Level of Assistance: 4: Min assist Extremity Assessment      RLE Assessment RLE Assessment: Exceptions to [facility] Passive Range of Motion (PROM) Comments: lacking ~10 deg knee extension RLE Strength Right Hip Flexion: 4-/5 Right Knee Flexion: 4-/5 Right Knee Extension: 4-/5 LLE Assessment Passive Range of Motion (PROM) Comments: lacking ~10 deg knee extension LLE Strength Left Hip Flexion: 3+/5 Left Knee  Flexion: 4-/5 Left Knee Extension: 4-/5  Care Tool Care Tool Bed Mobility Roll left and right activity   Roll left and right assist level: Supervision/Verbal cueing    Sit to lying activity   Sit to lying assist level: Minimal Assistance - Patient > 75%    Lying to sitting on side of bed activity   Lying to sitting on side of bed assist level: the ability to move from lying on the back to sitting on the side of the bed with no back support.: Minimal Assistance - Patient > 75%     Care Tool Transfers Sit to stand transfer Sit to stand activity did not occur: Safety/medical concerns      Chair/bed transfer   Chair/bed transfer assist level: Minimal Assistance - Patient > 75%     Toilet transfer   Assist Level: Moderate Assistance - Patient 50 - 74%    Car transfer Car transfer activity did not occur: Safety/medical concerns        Care Tool Locomotion Ambulation Ambulation activity did not occur: Safety/medical concerns        Walk 10 feet activity Walk 10 feet activity did not occur: Safety/medical concerns       Walk 50 feet with 2 turns activity Walk 50 feet with 2 turns activity did not occur: Safety/medical concerns      Walk 150 feet activity  Walk 150 feet activity did not occur: Safety/medical concerns      Walk 10 feet on uneven surfaces activity Walk 10 feet on uneven surfaces activity did not occur: Safety/medical concerns      Stairs Stair activity did not occur: Safety/medical concerns        Walk up/down 1 step activity Walk up/down 1 step or curb (drop down) activity did not occur: Safety/medical concerns      Walk up/down 4 steps activity Walk up/down 4 steps activity did not occur: Safety/medical concerns      Walk up/down 12 steps activity Walk up/down 12 steps activity did not occur: Safety/medical concerns      Pick up small objects from floor Pick up small object from the floor (from standing position) activity did not occur: Safety/medical concerns      Wheelchair Is the patient using a wheelchair?: Yes Type of Wheelchair: Manual   Wheelchair assist level: Supervision/Verbal cueing Max wheelchair distance: 100 ft  Wheel 50 feet with 2 turns activity   Assist Level: Supervision/Verbal cueing  Wheel 150 feet activity   Assist Level: Moderate Assistance - Patient 50 - 74%    Refer to Care Plan for Long Term Goals  SHORT TERM GOAL WEEK 1 PT Short Term Goal 1 (Week 1): Pt will perform transfers with supervision PT Short Term Goal 2 (Week 1): Pt will stand with RW PT Short Term Goal 3 (Week 1): Pt will initiate pre gait activities  Recommendations for other services: None   Skilled Therapeutic Intervention Pt seated EOB on arrival and throughout subjective exam. Evaluation completed (see details above and below) with education on PT POC and goals and individual treatment initiated with focus on  initiating transfer training. Motor and sensory testing in sitting. Pt donned L prosthetic independently. Pt able to verbalize WB precautions but with difficulty maintaining during transfers. Pt insistent on performing squat pivot with arm rest in place. Squat pivot<>w/c with min A. Pt then propelled w/c with  BUE with supervision x 100 ft. Pt provided with tour of unit and introduction to rehab. Pt returned  to EOB and doffed prosthetic, was left with all needs in reach and alarm active.   Mobility Bed Mobility Bed Mobility: Sit to Supine;Supine to Sit Supine to Sit: Contact Guard/Touching assist Sit to Supine: Contact Guard/Touching assist Transfers Transfers: Squat Pivot Transfers Squat Pivot Transfers: Minimal Assistance - Patient > 75% Locomotion  Gait Ambulation: No Gait Gait: No Stairs / Additional Locomotion Stairs: No Wheelchair Mobility Wheelchair Mobility: Yes Wheelchair Assistance: Chartered loss adjuster: Both upper extremities Wheelchair Parts Management: Needs assistance   Discharge Criteria: Patient will be discharged from PT if patient refuses treatment 3 consecutive times without medical reason, if treatment goals not met, if there is a change in medical status, if patient makes no progress towards goals or if patient is discharged from hospital.  The above assessment, treatment plan, treatment alternatives and goals were discussed and mutually agreed upon: by patient  Mickel Fuchs 01/10/2022, 12:33 PM

## 2022-01-10 NOTE — Progress Notes (Signed)
Inpatient Rehabilitation Care Coordinator Assessment and Plan Patient Details  Name: Antonio [name]. MRN: [id number] Date of Birth: [birth year]  Today's Date: 01/10/2022  Hospital Problems: Principal Problem:   S/P transmetatarsal amputation of foot, right [facility])  Past Medical History:  Past Medical History:  Diagnosis Date   Coronary artery disease    Diabetes mellitus without complication (HCC)    Hyperlipidemia    Hypertension    Osteomyelitis (HCC) 03/25/2017   RT FOOT   Past Surgical History:  Past Surgical History:  Procedure Laterality Date   AMPUTATION Right 03/27/2017   Procedure: 1st Ray Amputation Right Foot;  Surgeon: Nadara Mustard, MD;  Location: [facility] OR;  Service: Orthopedics;  Laterality: Right;   APPENDECTOMY     BELOW KNEE LEG AMPUTATION Left    CARDIAC CATHETERIZATION     CORONARY STENT INTERVENTION  2005   ENDARTERECTOMY Right 01/01/2022   Procedure: RIGHT ILEOFEMORAL ENDARTERECTOMY with PATCH ANGIOPLASTY;  Surgeon: Victorino Sparrow, MD;  Location: [facility] OR;  Service: Vascular;  Laterality: Right;   Great toe amputation right.     I & D EXTREMITY Left 12/30/2015   Procedure: IRRIGATION AND DEBRIDEMENT LEFT FOOT, TRANSMETATARSAL AMPUTATION WITH APPLICATION OF ANTIBIOTIC BEADS AND WOUND VAC;  Surgeon: Nadara Mustard, MD;  Location: [facility] OR;  Service: Orthopedics;  Laterality: Left;   IR GASTROSTOMY TUBE MOD SED  12/23/2018   LOWER EXTREMITY ANGIOGRAPHY Right 12/29/2021   Procedure: LOWER EXTREMITY ANGIOGRAPHY;  Surgeon: Leonie Douglas, MD;  Location: [facility] INVASIVE CV LAB;  Service: Cardiovascular;  Laterality: Right;   TONSILLECTOMY     TRACHEOSTOMY  11/2018   TRANSMETATARSAL AMPUTATION Right 01/01/2022   Procedure: RIGHT TRANSMETATARSAL AMPUTATION;  Surgeon: Victorino Sparrow, MD;  Location: [facility] OR;  Service: Vascular;  Laterality: Right;   Social History:  reports that he quit smoking about 3 years ago. His smoking use included cigarettes. He has a 70.00 pack-year smoking  history. He has never used smokeless tobacco. He reports current alcohol use. He reports that he does not use drugs.  Family / Support Systems Marital Status: Divorced Patient Roles: Parent, Other (Comment) (son) Spouse/Significant Other: Robin-ex-wife Children: Bailey-daughter [phone] Other Supports: David-cousin [phone] Anticipated Caregiver: Self, daughter to check on him, lives in MOm's basement she is 24 yo and can not assist him Ability/Limitations of Caregiver: Daughter works and can check on him Caregiver Availability: Intermittent Family Dynamics: Close with daughter and his Mom whom he lives with. Pt is very independent and does not plan to need assist and will be driving again according to him  Social History Preferred language: English Religion: Christian Cultural Background: No issues Education: HS Health Literacy - How often do you need to have someone help you when you read instructions, pamphlets, or other written material from your doctor or pharmacy?: Never Writes: Yes Employment Status: Disabled Marine scientist Issues: No issues Guardian/Conservator: None-according to MD pt is capable of making his own decisions while here   Abuse/Neglect Abuse/Neglect Assessment Can Be Completed: Yes Physical Abuse: Denies Verbal Abuse: Denies Sexual Abuse: Denies Exploitation of patient/patient's resources: Denies Self-Neglect: Denies  Patient response to: Social Isolation - How often do you feel lonely or isolated from those around you?: Never  Emotional Status Pt's affect, behavior and adjustment status: Pt is one who has always taken care of himself and has done what he wanted to do. He has had to make adaptations to his way, but plans to be mod/i and never go back to  a SNF again. He has in the past for rehab, never again. Recent Psychosocial Issues: other health issues was managing them before this and his wound Psychiatric History: No history able to  verbalize his feelings and concerns. He is very outspoken and wants to do things his way. 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  Patient / Family Perceptions, Expectations & Goals Pt/Family understanding of illness & functional limitations: Pt is able to explain all of his surgeries and his WB issues on his foot. He talks with the MD daily and feels he is aware of his treatment plan moving forward. He plans on being independent before going home from here Premorbid pt/family roles/activities: Son, father, retiree, friend, etc Anticipated changes in roles/activities/participation: resume Pt/family expectations/goals: Pt states: " I plan to be independent when I leave here, have to be, no one to take care of me but me."  [employer]: Other (Comment) (Wound Center) Premorbid Home Care/DME Agencies: Other (Comment) (has power chair, wc, rollator, bsc) Transportation available at discharge: Patient drives and plans to at DC Is the patient able to respond to transportation needs?: Yes In the past 12 months, has lack of transportation kept you from medical appointments or from getting medications?: No In the past 12 months, has lack of transportation kept you from meetings, work, or from getting things needed for daily living?: No Resource referrals recommended: Support group (specify)  Discharge Planning Living Arrangements: Parent Support Systems: Parent, Children, Friends/neighbors Type of Residence: Private residence Insurance Resources: [employer] (specify county) Water quality scientist) Surveyor, quantity Resources: [employer] Financial Screen Referred: No Living Expenses: Lives with family Money Management: Patient, Family Does the patient have any problems obtaining your medications?: Yes (Describe) (unsure issues has medicaid which are $3.00 per med) Home Management: Pt and  mom Patient/Family Preliminary Plans: Return home to MOm's home where he lives in an apartment in her basement. He was mod/i prior to admission and plans on being this way again. His daughter will come by and check on him but works and can not be there with him. Await therapy evaluations and work on discharge needs. Care Coordinator Barriers to Discharge: Lack of/limited family support, Insurance for SNF coverage, Wound Care Care Coordinator Anticipated Follow Up Needs: HH/OP, Support Group  Clinical Impression Pleasant quite talkative gentleman who likes to do things his way. Encouraged him to listen to his therapists since they may be able to teach him a safer way to transfer, etc. Plan to return to Mom's home but will need to be mod/I to be safe alone, Mom can not assist him and daughter will check on intermittently. Will await therapy evaluations and work on discharge needs.  Lucy Chris 01/10/2022, 9:42 AM

## 2022-01-10 NOTE — Progress Notes (Signed)
Physical Therapy Session Note  Patient Details  Name: Antonio [name]. MRN: [id number] Date of Birth: 1956/04/26  Today's Date: 01/10/2022 PT Individual Time: 1420-1455 PT Individual Time Calculation (min): 35 min   Short Term Goals: Week 1:  PT Short Term Goal 1 (Week 1): Pt will perform transfers with supervision PT Short Term Goal 2 (Week 1): Pt will stand with RW PT Short Term Goal 3 (Week 1): Pt will initiate pre gait activities  Skilled Therapeutic Interventions/Progress Updates:  Pt [facility] with nsg. Peri-care completed total A. Required minA x 2 for safe transfer back to EOB. Pt noted to be putting weight through foot vs just heel. Pt was able to perform lateral leans to change shorts as was incontinent due to diarrhea. Pt performed Sit to stand with CGA to allow PTA to pull pants over hips.Once completed pt indicated has had stomach issues this pm and frustrated with loose stools. Pt politely declining any additional standing or ambulation. Pt demonstrated lateral scoots to [facility] with set up. Pt demonstrating good sitting balance performing reaching for table tray in front and reaching for blankets behind. Discussed with pt goals and possible use of Darco shoe to assist with offloading forefoot. Pt states he was minimally ambulatory prior to this event. Encouraged pt to try shoe if received. Pt then required minA for doffing prothesis and sock. Pt left seated EOB with bed alarm on, call bell within reach and needs met.  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, pt able Restrictions Weight Bearing Restrictions: Yes RLE Weight Bearing: Partial weight bearing RLE Partial Weight Bearing Percentage or Pounds: heel only Other Position/Activity Restrictions: Per vascular sx note General: PT Amount of Missed Time (min): 40 Minutes PT Missed Treatment Reason: Patient ill (Comment) Vital  Signs:  Pain:   Mobility: Bed Mobility Bed Mobility: Sit to Supine;Supine to Sit Supine to Sit: Contact Guard/Touching assist Sit to Supine: Contact Guard/Touching assist Transfers Transfers: Squat Pivot Transfers Squat Pivot Transfers: Minimal Assistance - Patient > 75% Locomotion : Gait Ambulation: No Gait Gait: No Stairs / Additional Locomotion Stairs: No Wheelchair Mobility Wheelchair Mobility: Yes Wheelchair Assistance: Chartered loss adjuster: Both upper extremities Wheelchair Parts Management: Needs assistance  Trunk/Postural Assessment : Cervical Assessment Cervical Assessment: Within Functional Limits Thoracic Assessment Thoracic Assessment: Within Functional Limits Lumbar Assessment Lumbar Assessment: Within Functional Limits Postural Control Postural Control: Within Functional Limits  Balance: Balance Balance Assessed: Yes Static Sitting Balance Static Sitting - Balance Support: No upper extremity supported Static Sitting - Level of Assistance: 6: Modified independent (Device/Increase time) Dynamic Sitting Balance Dynamic Sitting - Balance Support: No upper extremity supported Dynamic Sitting - Level of Assistance: 4: Min assist Exercises:   Other Treatments:      Therapy/Group: Individual Therapy  Ruhama Lehew 01/10/2022, 3:09 PM

## 2022-01-11 LAB — GLUCOSE, CAPILLARY
Glucose-Capillary: 103 mg/dL — ABNORMAL HIGH (ref 70–99)
Glucose-Capillary: 123 mg/dL — ABNORMAL HIGH (ref 70–99)
Glucose-Capillary: 182 mg/dL — ABNORMAL HIGH (ref 70–99)
Glucose-Capillary: 211 mg/dL — ABNORMAL HIGH (ref 70–99)

## 2022-01-11 MED ORDER — SENNOSIDES-DOCUSATE SODIUM 8.6-50 MG PO TABS
1.0000 | ORAL_TABLET | Freq: Two times a day (BID) | ORAL | Status: DC
  Administered 2022-01-12 – 2022-01-23 (×12): 1 via ORAL
  Filled 2022-01-11 (×23): qty 1

## 2022-01-11 NOTE — Progress Notes (Signed)
Occupational Therapy Session Note  Patient Details  Name: Antonio [name]. MRN: [id number] Date of Birth: 1956-03-03  Today's Date: 01/12/2022 OT Individual Time: 7425-9563 and 8756-4332 OT Individual Time Calculation (min): 79 min and 50 min 10 minutes missed  Short Term Goals: Week 1:  OT Short Term Goal 1 (Week 1): Pt will complete LB dressing via lateral leans with supervision OT Short Term Goal 2 (Week 1): Pt will complete sit > stand in prep for ADL with Max A OT Short Term Goal 3 (Week 1): Pt will complete pericare with CGA for increased independence with toileting self-care  Skilled Therapeutic Interventions/Progress Updates:    Pt greeted sitting EOB with no c/o pain. Agreeable to engage in BADLs this morning, daughter arriving later to help him wash his hair and shave. Note that pt is quite particular about his self care setup and the familiar techniques that he's been using to complete certain aspects of his ADL. He adamantly refused to don his offloading shoe during lateral leans when given instruction from therapist, educated him on his WB restrictions s/p amputation with pt still refusing. "Will you just help me pull up my shorts?" Noted SOB, pt needed multiple prolonged rest breaks after dynamic activity throughout tx. 02 assessed throughout session with readings of 82-87% on RA. Nursing arrived and was able to obtain sats in low 90s range, informed nursing that this therapist was not able to obtain sats in the 90s despite rest to breathe and performance of gentle hand exercises to promote circulation to the digits. Pt with soiled bed linens so he was agreeable to transfer OOB to change them. CGA for squat pivot<w/c, pt wearing both offloading shoe + prosthetic. Pt assisted OT with changing pillowcases during bedmaking. Sats still low, pt still with SOB and very limited endurance. CGA for squat pivot<bed. Noted at this time pt had a skin tear with bleeding on his armrest. Informed  RN and OT assisted with sterilizing skin tear, applying foam dressing, and wrapping Lt forearm to help control the bleeding. Pt remained in bed at close of session, all needs within reach and bed alarm set.    2nd Session 1:1 tx (50 min) Pt greeted EOB after lunch. 02 sats on room air 79-83%. Pt reported feeling "awful" and that he couldn't find his breath. Informed LPN who set pt up with supplemental 02. Encouraged pt to engage in diaphragmatic breathing with education on technique while this was being set up. When pt was placed on 2L 02 sats were between 89-92% when assessed during tx. Note that pt is quite verbose and required cues to focus on breathing vs converse with OT and nurse. Pt refused transferring to the w/c or engaging in therapeutic activity, stated he just wanted to "take the day off" from therapy because he had a rough night pain-wise last night. He was agreeable to engage in d/c planning, questionable usefulness of OT education as pt, once again, was hyperverbal and tangential when OT initiated discussions pertaining to amputee care and ADL/IADL participation at home. Printed him out an energy conservation handout to guide education. Pt has a w/c accessible home, lives with daughter (who works FT) and elderly mother who has 2 home health aides. Pt reports that HHAs can also assist him in the day. Pt reports that he used the w/c at home and is familiar with modified ADL/IADL, wanted no other suggestions from OT to increase ease/safety. We discussed using a scrotal sling at the hospital to  help with pain from scrotal edema, pt again refusing education. Offered to bring in a [facility] mirror with education on importance of daily skin inspection during amputation. Pt refused this as well. Pt remained sitting up in bed with all needs within reach, bed alarm set, satting at 90% on 2L. Tx time missed due to pt refusal to participate.    Therapy Documentation Precautions:  Precautions Precautions:  Fall Precaution Comments: s/p R TMA; prothesis for L prior BKA in room, pt able Restrictions Weight Bearing Restrictions: Yes RLE Weight Bearing: Partial weight bearing RLE Partial Weight Bearing Percentage or Pounds: heel only Other Position/Activity Restrictions: Per vascular sx note  Pain: Pain Assessment Pain Score: 0-No pain ADL:  Therapy/Group: Individual Therapy  Melody Savidge A Didi Ganaway 01/12/2022, 12:51 PM

## 2022-01-11 NOTE — Progress Notes (Signed)
PROGRESS NOTE   Subjective/Complaints:   Feeling OK- Got cleaned out last night- bowel meds "caught up"- didn't make it to toilet at least once- said would rather be constipated.   Wants me to d/c daily Dilaudid and just do prn.   ROS:  Pt denies SOB, abd pain, CP, N/V/C/D, and vision changes    Objective:   No results found. Recent Labs    01/09/22 0119 01/10/22 0521  WBC 6.6 7.7  HGB 10.5* 11.2*  HCT 34.2* 35.2*  PLT 211 217   Recent Labs    01/09/22 0119 01/10/22 0521  NA 143 143  K 4.1 4.0  CL 107 107  CO2 29 28  GLUCOSE 127* 114*  BUN 26* 26*  CREATININE 1.12 1.02  CALCIUM 8.0* 8.1*    Intake/Output Summary (Last 24 hours) at 01/11/2022 0950 Last data filed at 01/10/2022 1900 Gross per 24 hour  Intake 472 ml  Output 125 ml  Net 347 ml        Physical Exam: Vital Signs Blood pressure (!) 147/67, pulse 65, temperature 98 F (36.7 C), temperature source Oral, resp. rate 16, height 6' (1.829 m), SpO2 90 %.     General: awake, alert, appropriate, sitting up EOB; gravely hoarse voice; NAD HENT: conjugate gaze; oropharynx moist- old trach scar CV: regular rate; no JVD Pulmonary: CTA B/L; no W/R/R- good air movement GI: soft, NT, ND, (+)BS- not distended anymore Psychiatric: appropriate Neurological: Ox3-  Genitourinary:    Comments: Scrotum moderately swollen- pt says was size of grapefruit- is now large navel orange- no change Musculoskeletal:     Comments: L BKA healed well- no redness R TMA- in ACE wrap Lacking 30 degrees of L knee extension- contracture- painful to try and extend further UE 5/5  B/L  RLE- HF/KE/KF 5/5; NT distally LLE- HF 5/5- cannot test knee due to contracture  Skin:    Comments: Right TMA dressing in place.  Appropriately tender- covered with ACE wrap Significant Clubbing of fingernails B/L  PICC in R upper arm- signs of blood outside PICC/surrounding it   Neurological:     Comments: Patient is alert.  No acute distress and follows commands.  Oriented x3. Intact to light touch I UE however decreased below knees B/L   Assessment/Plan: 1. Functional deficits which require 3+ hours per day of interdisciplinary therapy in a comprehensive inpatient rehab setting. Physiatrist is providing close team supervision and 24 hour management of active medical problems listed below. Physiatrist and rehab team continue to assess barriers to discharge/monitor patient progress toward functional and medical goals  Care Tool:  Bathing    Body parts bathed by patient: Right arm, Left arm, Chest, Abdomen, Front perineal area, Buttocks, Right upper leg, Left upper leg, Face   Body parts bathed by helper: Right lower leg Body parts n/a: Left lower leg (amputation)   Bathing assist Assist Level: Minimal Assistance - Patient > 75%     Upper Body Dressing/Undressing Upper body dressing   What is the patient wearing?: Pull over shirt    Upper body assist Assist Level: Minimal Assistance - Patient > 75%    Lower Body Dressing/Undressing Lower body dressing  What is the patient wearing?: Pants     Lower body assist Assist for lower body dressing: Maximal Assistance - Patient 25 - 49%     Toileting Toileting    Toileting assist Assist for toileting: Maximal Assistance - Patient 25 - 49%     Transfers Chair/bed transfer  Transfers assist  Chair/bed transfer activity did not occur: Safety/medical concerns  Chair/bed transfer assist level: Moderate Assistance - Patient 50 - 74%     Locomotion Ambulation   Ambulation assist   Ambulation activity did not occur: Safety/medical concerns          Walk 10 feet activity   Assist  Walk 10 feet activity did not occur: Safety/medical concerns        Walk 50 feet activity   Assist Walk 50 feet with 2 turns activity did not occur: Safety/medical concerns         Walk 150 feet  activity   Assist Walk 150 feet activity did not occur: Safety/medical concerns         Walk 10 feet on uneven surface  activity   Assist Walk 10 feet on uneven surfaces activity did not occur: Safety/medical concerns         Wheelchair     Assist Is the patient using a wheelchair?: Yes Type of Wheelchair: Manual    Wheelchair assist level: Supervision/Verbal cueing Max wheelchair distance: 100 ft    Wheelchair 50 feet with 2 turns activity    Assist        Assist Level: Supervision/Verbal cueing   Wheelchair 150 feet activity     Assist      Assist Level: Moderate Assistance - Patient 50 - 74%   Blood pressure (!) 147/67, pulse 65, temperature 98 F (36.7 C), temperature source Oral, resp. rate 16, height 6' (1.829 m), SpO2 90 %.  Medical Problem List and Plan: 1. 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  Note specifies right TMA dressing to remain in place until 2/13 as well as history of left BKA 2019             -patient may  shower if cover R TMA/RLE             -ELOS/Goals: 7-10 days mod I-   Con't CIR- PT and OT 2.  Antithrombotics: -DVT/anticoagulation:  Pharmaceutical: Lovenox             -antiplatelet therapy: Aspirin 81 mg daily and Plavix 75 mg daily 3. Pain Management: Dilaudid 4 mg every 4 hours as needed  2/15- will order 1x/day- daily scheduled pain meds at 7:30am so can participate in therapy.   2/16- pt wants scheduled pain meds d/c;d  4. Mood: Provide emotional support             -antipsychotic agents: N/A 5. Neuropsych: This patient is capable of making decisions on his own behalf. 6. Skin/Wound Care: Routine skin checks 7. Fluids/Electrolytes/Nutrition: Routine in and outs with follow-up chemistries 8.  Diabetes mellitus.  Hemoglobin A1c 11.3.  Semglee 24 units daily.  Check blood sugars before meals and at bedtime  2/15- great  control low 100s- con't regimen  2/16- don't think pt was on insulin at home- will need DM coordinator for education and see if can wean insulin to orals at all? Think he will need some insulin 9.  Hypertension.  Hydralazine 25 mg every 8 hours, Coreg 25 mg twice daily, Norvasc 10 mg daily,  Avapro 75 mg daily.  Monitor with increased mobility  2/15- BP controlled on current regimen- con't 10.  Hyperlipidemia.  Lipitor 11.  CAD with stenting.  Continue aspirin and Plavix 12.  History of tobacco use.  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  13. L knee contracture- has BKA prosthesis- don't see a way to fix-  14. Scrotal swelling- since iliofemoral endarterectomy- will elevate.  15. New O2 requirement- will see if can wean off-   2/16- off O2 16. RUE PICC- will see if able to get out?  2/16- will order for removal 17. Loose stools  2/16- will make colace prn and change senna to 1 tab BID- and have him hold bowel meds today  I spent a total of  36  minutes on total care today- >50% coordination of care- due to d/w DM coordination as well as nursing about insulin education- and d/w pt about insulin       LOS: 2 days A FACE TO FACE EVALUATION WAS PERFORMED  Cem Kosman 01/11/2022, 9:50 AM

## 2022-01-11 NOTE — Progress Notes (Signed)
Occupational Therapy Session Note  Patient Details  Name: Antonio [name]. MRN: [id number] Date of Birth: [birth year]  Today's Date: 01/11/2022 OT Individual Time: 1020-1115 OT Individual Time Calculation (min): 55 min    Short Term Goals: Week 1:  OT Short Term Goal 1 (Week 1): Pt will complete LB dressing via lateral leans with supervision OT Short Term Goal 2 (Week 1): Pt will complete sit > stand in prep for ADL with Max A OT Short Term Goal 3 (Week 1): Pt will complete pericare with CGA for increased independence with toileting self-care  Skilled Therapeutic Interventions/Progress Updates:  Patient met seated in wc in agreement with OT treatment session. 5/10 pain reported at rest and with activity. Patient reports pain meds due in 15 minutes. In agreement with working with this Probation officer until able to receive pain meds. Patient offered LB clothing and/or posterior hospital gown but declined both demonstrating poor awareness. Patient states "I was born naked into this work and ima die naked". Patient eventually in agreement with donning posterior gown. Wc mobility to dayroom in prep for therapeutic activity. Patient states that he is unable to tolerate face mask given medical history. Education on safety with sit to stand transfers but patient with poor response to education. Time spent re-educating patient on purpose of rehab and role of OT. Patient also asked to clarify goals reporting desire to return home at Mod I level from electric wc for ADLs and assist from his daughter with IADLs. Education on benefit of participating with therapy efforts to reach goals. Patient with poor awareness of CLOF and need for external assist. Attempted wc push-ups requiring patient to shift anteriorly in chair. With movement toward edge of wc patient with onset of anxiety. Time spent educating patient on pursed lip breathing and relaxation techniques. Remainder of session with focus on BUE HEP with 4lb weight  dowel. Patient tolerated 2 sets x10 reps each (completed 1 set x 20 reps despite instructions listed above). Session concluded with patient seated in wc with call bell within reach, chair alarm activated and all needs met.   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, pt able Restrictions Weight Bearing Restrictions: Yes RLE Weight Bearing: Partial weight bearing RLE Partial Weight Bearing Percentage or Pounds: heel only Other Position/Activity Restrictions: Per vascular sx note General:   Therapy/Group: Individual Therapy  [name] 01/11/2022, 7:21 AM

## 2022-01-11 NOTE — Progress Notes (Signed)
Occupational Therapy Session Note  Patient Details  Name: Antonio [name]. MRN: [id number] Date of Birth: 1956-04-10  Today's Date: 01/11/2022 OT Individual Time: 2703-5009 OT Individual Time Calculation (min): 33 min    Short Term Goals: Week 1:  OT Short Term Goal 1 (Week 1): Pt will complete LB dressing via lateral leans with supervision OT Short Term Goal 2 (Week 1): Pt will complete sit > stand in prep for ADL with Max A OT Short Term Goal 3 (Week 1): Pt will complete pericare with CGA for increased independence with toileting self-care  Skilled Therapeutic Interventions/Progress Updates:  Pt greeted seated in w/c  agreeable to OT intervention. Session focus on functional ADL transfers and decreasing overall caregiver burden.   Pt reprots at home he uses squat pivots for all mobility aspects. Ortho tech enter to issue pt darco shoe to maintain PWB during transfers. Donned new shoe with total with pt reporting proper fit. Pt completed x2 squat pivot transfers from EOB<>w/c with MIN - MOD A. Cues needed for safety awareness and body mechanics. pt left seated in w/c with all needs within reach and safety belt activated.                        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     Therapy/Group: Individual Therapy  Barron Schmid 01/11/2022, 12:37 PM

## 2022-01-11 NOTE — Progress Notes (Signed)
Physical Therapy Session Note  Patient Details  Name: Antonio [name]. MRN: [id number] Date of Birth: August 17, 1956  Today's Date: 01/11/2022 PT Individual Time: 6433-2951 PT Individual Time Calculation (min): 60 min   Short Term Goals: Week 1:  PT Short Term Goal 1 (Week 1): Pt will perform transfers with supervision PT Short Term Goal 2 (Week 1): Pt will stand with RW PT Short Term Goal 3 (Week 1): Pt will initiate pre gait activities  Skilled Therapeutic Interventions/Progress Updates:     Patient sitting EOB upon PT arrival. Patient alert and agreeable to PT session. Patient denied pain during session. Reports frustration with frequency of BM/diarrhea last night.   Patient verbose and demonstrates poor activity tolerance and compliance with weight bearing precautions throughout session, despite heavy cues. May benefit from toe off-loading Darco shoe. Reports he used a scooter for mobility at home and that he does not go into the community, says his last doctors appointment was in 2020. Educated on need for transport for follow-up appointments, patient stated, "I won't go to them." Patient not receptive to education or cuing throughout session.   Discussed home routine and limb inspection and hygiene. Patient reports he uses his phone to take photos of his foot daily, showers every other day, and applies lotion following showering.   Therapeutic Activity: Transfers: Patient donned L prosthesis with set-up assist prior to transfers. Patient attempted squat pivot to [facility], but then reported "I don't have to go." He then performed squat pivot bed>w/c with CGA and poor compliance with heel weight bearing. Provided verbal cues for weight bearing compliance and use of upper extremities to lift hips. Patient prefers to keep the w/c arm rest in place during transfers, as this is how he transfers at home. Patient performed oral care with set-up assist at the sink prior to leaving the room.    Therapeutic Exercise: Patient performed the following exercises with verbal and tactile cues for proper technique. -w/c propulsion >100 feet x3 for upper extremity strengthening and endurance -seated marching x20 -B LAQ 2x10 -seated clam shells 2x10 -w/c push-ups 2x5  Patient in w/c in the room at end of session with breaks locked, seat belt alarm set, and all needs within reach.   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

## 2022-01-11 NOTE — Progress Notes (Signed)
Occupational Therapy Session Note  Patient Details  Name: Antonio [name]. MRN: [id number] Date of Birth: [birth year]  Today's Date: 01/11/2022 OT Individual Time: 3403-7096 OT Individual Time Calculation (min): 40 min    Short Term Goals: Week 1:  OT Short Term Goal 1 (Week 1): Pt will complete LB dressing via lateral leans with supervision OT Short Term Goal 2 (Week 1): Pt will complete sit > stand in prep for ADL with Max A OT Short Term Goal 3 (Week 1): Pt will complete pericare with CGA for increased independence with toileting self-care  Skilled Therapeutic Interventions/Progress Updates:     Pt received seated in w/c, no c/o pain and declines need for ADL, agreeable to therapy. Session focus on activity tolerance, BUE/Core strengthening, transfer retraining in prep for improved ADL/IADL/func mobility performance + decreased caregiver burden. Total A to don R darco shoe. Self propelled self with BUE > gym with close S. Required seated rest break halfway through due to fatigue. Squat-pivot to and from mat table with CGA and assist to stabilize w/c and manage parts. Required extensive time to recover before/post transferring due to fatigue.  Pt completed 1x10 modified sit-ups against foam wedge, russian core twists, and overhead ball toss with 1 kg medicine ball. Rests breaks in between sets.  SatO2 at 88-93% throughout session, intermittent cues for PLB with activity as pt tends to hold breath.   Pt left seated in w/c with safety belt alarm engaged, call bell in reach, and all immediate needs met.   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, pt able Restrictions Weight Bearing Restrictions: Yes RLE Weight Bearing: Partial weight bearing RLE Partial Weight Bearing Percentage or Pounds: heel only Other Position/Activity Restrictions: Per vascular sx note  Pain: Pain Assessment Pain  Score: 0-No pain ADL: See Care Tool for more details.   Therapy/Group: Individual Therapy  Volanda Napoleon MS, OTR/L  01/11/2022, 6:53 AM

## 2022-01-11 NOTE — Progress Notes (Signed)
Received diabetes coordinator consult. Spoke with patient at the bedside about his diabetes. States that he was diagnosed "a long time ago". States that he uses the insulin pen at home, but does not remember what he was taking and how much. He described exactly how to give and that he uses his arms and thighs for injections.   States that he has a home blood glucose meter at home, but he says he is not using it the way he should. States that he last saw his nurse practitioner about 2 1/2 years ago. He is ready to find a new physician.    Discussed Hgb A1C of 11.3%. He is aware that he needs to do better! Will continue to monitor blood sugars while in the hospital.   Smith Mince RN BSN CDE Diabetes Coordinator Pager: [phone]  8am-5pm

## 2022-01-12 ENCOUNTER — Inpatient Hospital Stay (HOSPITAL_COMMUNITY): Payer: Medicaid Other

## 2022-01-12 LAB — GLUCOSE, CAPILLARY
Glucose-Capillary: 141 mg/dL — ABNORMAL HIGH (ref 70–99)
Glucose-Capillary: 150 mg/dL — ABNORMAL HIGH (ref 70–99)
Glucose-Capillary: 158 mg/dL — ABNORMAL HIGH (ref 70–99)
Glucose-Capillary: 118 mg/dL — ABNORMAL HIGH (ref 70–99)

## 2022-01-12 MED ORDER — FUROSEMIDE 10 MG/ML IJ SOLN
40.0000 mg | Freq: Once | INTRAMUSCULAR | Status: AC
  Administered 2022-01-12: 40 mg via INTRAVENOUS
  Filled 2022-01-12: qty 4

## 2022-01-12 MED ORDER — DICLOFENAC SODIUM 1 % EX GEL
2.0000 g | Freq: Four times a day (QID) | CUTANEOUS | Status: DC
  Administered 2022-01-12 – 2022-01-23 (×41): 2 g via TOPICAL
  Filled 2022-01-12: qty 100

## 2022-01-12 NOTE — Progress Notes (Addendum)
PROGRESS NOTE   Subjective/Complaints:   Pt reports doing well this AM-  Still in pain, but is stable on current regimen- doesn't want ot change it.  Except they made him push manual w/c down to gym and back- old football injury of L shoulder acting up- painful  Used to power w/c at home. Agreeable to trying voltaren gel for L shoulder.   Met with Dm educator/coordinator- doesn't remember what insulin was using at home and hasn't used glucometer "like he should".  -  ROS:  Pt denies SOB, abd pain, CP, N/V/C/D, and vision changes      Objective:   No results found. Recent Labs    01/10/22 0521  WBC 7.7  HGB 11.2*  HCT 35.2*  PLT 217   Recent Labs    01/10/22 0521  NA 143  K 4.0  CL 107  CO2 28  GLUCOSE 114*  BUN 26*  CREATININE 1.02  CALCIUM 8.1*    Intake/Output Summary (Last 24 hours) at 01/12/2022 3300 Last data filed at 01/12/2022 0700 Gross per 24 hour  Intake 720 ml  Output 500 ml  Net 220 ml        Physical Exam: Vital Signs Blood pressure (!) 150/69, pulse 71, temperature 97.7 F (36.5 C), temperature source Oral, resp. rate 18, height 6' (1.829 m), weight 93 kg, SpO2 92 %.     General: awake, alert, appropriate, sitting up at EOB; NAD HENT: conjugate gaze; oropharynx moist- old trach site notable- hoarse voice chronic CV: regular rate; no JVD Pulmonary: CTA B/L; no W/R/R- good air movement GI: soft, NT, ND, (+)BS Psychiatric: appropriate- joking some Neurological: Ox3  Genitourinary:    Comments: Scrotum moderately swollen- pt says was size of grapefruit- is now large navel orange- no change Musculoskeletal: TTP across L shoulder- c/w RTC injjry- posterior pain    Comments: L BKA healed well- no redness R TMA- in ACE wrap Lacking 30 degrees of L knee extension- contracture- painful to try and extend further UE 5/5  B/L  RLE- HF/KE/KF 5/5; NT distally LLE- HF 5/5- cannot test  knee due to contracture  Skin:    Comments: Right TMA dressing in place.  Appropriately tender- covered with ACE wrap Significant Clubbing of fingernails B/L  PICC in R upper arm- signs of blood outside PICC/surrounding it  Neurological:     Comments: Patient is alert.  No acute distress and follows commands.  Oriented x3. Intact to light touch I UE however decreased below knees B/L   Assessment/Plan: 1. Functional deficits which require 3+ hours per day of interdisciplinary therapy in a comprehensive inpatient rehab setting. Physiatrist is providing close team supervision and 24 hour management of active medical problems listed below. Physiatrist and rehab team continue to assess barriers to discharge/monitor patient progress toward functional and medical goals  Care Tool:  Bathing    Body parts bathed by patient: Right arm, Left arm, Chest, Abdomen, Front perineal area, Buttocks, Right upper leg, Left upper leg, Face   Body parts bathed by helper: Right lower leg Body parts n/a: Left lower leg (amputation)   Bathing assist Assist Level: Minimal Assistance - Patient > 75%  Upper Body Dressing/Undressing Upper body dressing   What is the patient wearing?: Pull over shirt    Upper body assist Assist Level: Minimal Assistance - Patient > 75%    Lower Body Dressing/Undressing Lower body dressing      What is the patient wearing?: Pants     Lower body assist Assist for lower body dressing: Maximal Assistance - Patient 25 - 49%     Toileting Toileting    Toileting assist Assist for toileting: Independent with assistive device Assistive Device Comment: urinal   Transfers Chair/bed transfer  Transfers assist  Chair/bed transfer activity did not occur: Safety/medical concerns  Chair/bed transfer assist level: Moderate Assistance - Patient 50 - 74%     Locomotion Ambulation   Ambulation assist   Ambulation activity did not occur: Safety/medical concerns           Walk 10 feet activity   Assist  Walk 10 feet activity did not occur: Safety/medical concerns        Walk 50 feet activity   Assist Walk 50 feet with 2 turns activity did not occur: Safety/medical concerns         Walk 150 feet activity   Assist Walk 150 feet activity did not occur: Safety/medical concerns         Walk 10 feet on uneven surface  activity   Assist Walk 10 feet on uneven surfaces activity did not occur: Safety/medical concerns         Wheelchair     Assist Is the patient using a wheelchair?: Yes Type of Wheelchair: Manual    Wheelchair assist level: Supervision/Verbal cueing Max wheelchair distance: 100 ft    Wheelchair 50 feet with 2 turns activity    Assist        Assist Level: Supervision/Verbal cueing   Wheelchair 150 feet activity     Assist      Assist Level: Moderate Assistance - Patient 50 - 74%   Blood pressure (!) 150/69, pulse 71, temperature 97.7 F (36.5 C), temperature source Oral, resp. rate 18, height 6' (1.829 m), weight 93 kg, SpO2 92 %.  Medical Problem List and Plan: 1. 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  Note specifies right TMA dressing to remain in place until 2/13 as well as history of left BKA 2019             -patient may  shower if cover R TMA/RLE             -ELOS/Goals: 7-10 days mod I-   Con't CIR_ PT and OT 2.  Antithrombotics: -DVT/anticoagulation:  Pharmaceutical: Lovenox             -antiplatelet therapy: Aspirin 81 mg daily and Plavix 75 mg daily 3. Pain Management: Dilaudid 4 mg every 4 hours as needed  2/15- will order 1x/day- daily scheduled pain meds at 7:30am so can participate in therapy.   2/16- pt wants scheduled pain meds d/c;d   2/17- will add Voltaren gel 2G QID for L shoulder pain  4. Mood: Provide emotional support             -antipsychotic agents: N/A 5.  Neuropsych: This patient is capable of making decisions on his own behalf. 6. Skin/Wound Care: Routine skin checks 7. Fluids/Electrolytes/Nutrition: Routine in and outs with follow-up chemistries 8.  Diabetes mellitus.  Hemoglobin A1c 11.3.  Semglee 24 units daily.  Check blood sugars before meals and at bedtime  2/15- great control low 100s- con't regimen  2/16- don't think pt was on insulin at home- will need DM coordinator for education and see if can wean insulin to orals at all? Think he will need some insulin  2/17- CBGs 103-211 last night- will monitor trend- and titrate as needed- educated by DM coordinator on how to do insulin and glucometer- will send home with new glucometer 9.  Hypertension.  Hydralazine 25 mg every 8 hours, Coreg 25 mg twice daily, Norvasc 10 mg daily, Avapro 75 mg daily.  Monitor with increased mobility  2/15- BP controlled on current regimen- con't  2/17- BP a little elevated- 150s/69- but will monitor for trend.  10.  Hyperlipidemia.  Lipitor 11.  CAD with stenting.  Continue aspirin and Plavix 12.  History of tobacco use.  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  13. L knee contracture- has BKA prosthesis- don't see a way to fix-  14. Scrotal swelling- since iliofemoral endarterectomy- will elevate.  15. New O2 requirement- will see if can wean off-   2/16- off O2 16. RUE PICC- will see if able to get out?  2/16- will order for removal 17. Loose stools  2/16- will make colace prn and change senna to 1 tab BID- and have him hold bowel meds today  2/17- doing better- con't regimen   I spent a total of  35   minutes on total care today- >50% coordination of care- due to  IPOC and d/w therapy about shoulder  LOS: 3 days A FACE TO FACE EVALUATION WAS PERFORMED  Antonio [name] 01/12/2022, 9:21 AM

## 2022-01-12 NOTE — IPOC Note (Signed)
Overall Plan of Care (IPOC) Patient Details Name: Antonio [name]. MRN: [id number] DOB: [birth year]  Admitting Diagnosis: S/P transmetatarsal amputation of foot, right [facility])  Hospital Problems: Principal Problem:   S/P transmetatarsal amputation of foot, right (HCC)     Functional Problem List: Nursing Bowel, Endurance, Medication Management, Pain, Safety, Skin Integrity  PT Pain, Behavior, Safety, Endurance, Skin Integrity, Sensory  OT Balance, Edema, Endurance, Motor, Nutrition, Pain, Safety  SLP    TR         Basic ADLs: OT Grooming, Bathing, Dressing, Toileting     Advanced  ADLs: OT Simple Meal Preparation     Transfers: PT Bed Mobility, Bed to Chair, Car  OT Toilet, Tub/Shower     Locomotion: PT Ambulation, Wheelchair Mobility     Additional Impairments: OT None  SLP        TR      Anticipated Outcomes Item Anticipated Outcome  Self Feeding Mod I  Swallowing      Basic self-care  Mod I  Toileting  Mod I   Bathroom Transfers Mod I  Bowel/Bladder  mod I  Transfers  mod I  Locomotion  mod I w/c, min A therapeutic gait  Communication     Cognition     Pain  < 4  Safety/Judgment  mod I   Therapy Plan: PT Intensity: Minimum of 1-2 x/day ,45 to 90 minutes PT Frequency: 5 out of 7 days PT Duration Estimated Length of Stay: 14-16 days OT Intensity: Minimum of 1-2 x/day, 45 to 90 minutes OT Frequency: 5 out of 7 days OT Duration/Estimated Length of Stay: 2 weeks     Due to the current state of emergency, patients may not be receiving their 3-hours of Medicare-mandated therapy.   Team Interventions: Nursing Interventions Patient/Family Education, Bowel Management, Disease Management/Prevention, Pain Management, Medication Management, Skin Care/Wound Management, Discharge Planning  PT interventions Ambulation/gait training, Discharge planning, DME/adaptive equipment instruction, Functional mobility training, Pain management, Psychosocial  support, Splinting/orthotics, Therapeutic Activities, UE/LE Strength taining/ROM, Wheelchair propulsion/positioning, UE/LE Coordination activities, Skin care/wound management, Patient/family education, Neuromuscular re-education, Stair training, Therapeutic Exercise, Functional electrical stimulation, Disease management/prevention, Firefighter, Warden/ranger  OT Interventions Warden/ranger, Discharge planning, Pain management, Self Care/advanced ADL retraining, Therapeutic Activities, UE/LE Coordination activities, Disease mangement/prevention, Functional mobility training, Patient/family education, Skin care/wound managment, Therapeutic Exercise, Community reintegration, Fish farm manager, UE/LE Strength taining/ROM, Wheelchair propulsion/positioning  SLP Interventions    TR Interventions    SW/CM Interventions Discharge Planning, Psychosocial Support, Patient/Family Education   Barriers to Discharge MD  Medical stability, Home enviroment access/loayout, Wound care, Lack of/limited family support, Weight, and Medication compliance  Nursing Decreased caregiver support, Home environment access/layout, Wound Care, Lack of/limited family support, Weight bearing restrictions Level entry, lives with daughter/family. Able to live on main level with bedroom/bathroom. Daughter can provide intermittent assist.  PT Home environment access/layout, Wound Care, Lack of/limited family support, Weight bearing restrictions    OT Home environment access/layout, Lack of/limited family support, Weight bearing restrictions, Wound Care    SLP      SW Lack of/limited family support, Insurance for SNF coverage, Wound Care     Team Discharge Planning: Destination: PT-Home ,OT- Home , SLP-  Projected Follow-up: PT-Home health PT, OT-  Home health OT, SLP-  Projected Equipment Needs: PT-To be determined, OT- To be determined, SLP-  Equipment Details: PT- , OT-   Patient/family involved in discharge planning: PT- Patient,  OT-Patient, SLP-   MD ELOS: 2 weeks Medical Rehab  Prognosis:  Good Assessment: pt is a 66 yr old male with poorly controlled DM and L BKA That's old- and new R TMA- educated on DM and insulin  BG's doing better-   Goals mod I   See Team Conference Notes for weekly updates to the plan of care

## 2022-01-12 NOTE — Progress Notes (Addendum)
Physical Therapy Session Note  Patient Details  Name: Antonio [name]. MRN: [id number] Date of Birth: Mar 03, 1956  Today's Date: 01/12/2022 PT Individual Time: 1400-1445 PT Individual Time Calculation (min): 45 min   Short Term Goals: Week 1:  PT Short Term Goal 1 (Week 1): Pt will perform transfers with supervision PT Short Term Goal 2 (Week 1): Pt will stand with RW PT Short Term Goal 3 (Week 1): Pt will initiate pre gait activities  Skilled Therapeutic Interventions/Progress Updates:  Patient seated EOB on entrance to room. Patient alert, wrapped in blanet, but not immediately agreeable to PT session OOB. Pt relates that he has had a rough night and morning struggling to breathe and was placed on 2L supplemental O2 via [place]. He is fatigued and not desiring to participate in any activity OOB. Plus he relates that MD recommended staying in bed (not found in chart).  O2 sats checked throughout session with readings at 89-91% throughout. Pt educated on breathing in through nose since he is being provided with supplemental O2 through his nose. It was seen in chart that MD wants to see readings 88% or higher. When checked throughout session, it is higher when pt isn't talking and focusing on breathing in through nose and out through mouth. Reminded throughout.  Patient with no pain complaint throughout session.  Therapeutic Activity: Bed Mobility: Patient performed weight shifting laterally along with lateral scoot in order to reposition self d/t soreness from staying in one spot.  Pt not wanting to lie down in bed as MD said that it is easier to breathe sitting up. Education provided that information MD provided does not mean that he can never lie down. Educated on lung expansion in gravity resisted and dependent positioning and explained more of what MD meant. Pt can lie down with back supported in bed. While he can utilize a bed that can change positioning, he should elevate HOB to higher  angle to give back muscles a break once in a while.   Pt relates increased pain in L>R shoulder that was aggravated this morning in one of his OT sessions. MD said she would order some sort of cream for shoulder but he has yet to see it. Voltaren gel obtained from RN and applied to pt's shoulders.   Patient seated upright on EOB at end of session with brakes locked, bed alarm set, and all needs within reach. Ice cup and Ice in pt personal pitcher provided on request.      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, pt able Restrictions Weight Bearing Restrictions: Yes RLE Weight Bearing: Partial weight bearing RLE Partial Weight Bearing Percentage or Pounds: heel only Other Position/Activity Restrictions: Per vascular sx note General:   Vital Signs: Therapy Vitals Temp: 98.8 F (37.1 C) Temp Source: Oral Pulse Rate: 67 Resp: 16 BP: 121/72 Patient Position (if appropriate): Sitting Oxygen Therapy SpO2: (!) 89 % O2 Device: Nasal Cannula O2 Flow Rate (L/min): 2 L/min Pain:   Therapy/Group: Individual Therapy  Loel Dubonnet PT, DPT 01/12/2022, 5:18 PM

## 2022-01-12 NOTE — Progress Notes (Addendum)
Patient sats at 85-86% on 2L 02 [place]. MD Kirstiens notified. New order received. Oxygen [place] now at 3L [place]. Patient reports no SOB. Lung sounds diminished. Hoarse voice. Continue to monitor   2036- Respiratory called to assess patient. Patient placed on 6L oxygen per respiratory at patient was sating again at 83%. MD Kirsteins notified. New order received for chest xray. Patient not complaining of shortness of breath at the moment but reports has had trouble "catching my breath today". Patient coughing up small amount of thick yellow/tan mucus.   2300 New order received from MD Kirstiens.   0300 Patient reported feeling some relief. Still coughing up moderate amount of mucus. Sats at 92-93% on 5L. Continue trying to wean back down.

## 2022-01-12 NOTE — Progress Notes (Signed)
Occupational Therapy Session Note  Patient Details  Name: Antonio [name]. MRN: [id number] Date of Birth: 02-19-1956  Today's Date: 01/13/2022 OT Individual Time: 2952-8413 OT Individual Time Calculation (min): 42 min    Short Term Goals: Week 1:  OT Short Term Goal 1 (Week 1): Pt will complete LB dressing via lateral leans with supervision OT Short Term Goal 2 (Week 1): Pt will complete sit > stand in prep for ADL with Max A OT Short Term Goal 3 (Week 1): Pt will complete pericare with CGA for increased independence with toileting self-care  Skilled Therapeutic Interventions/Progress Updates:    Pt greeted sitting up in bed, satting at 96% on 4L. He refused transfer to the w/c as he stated MD told him there was fluid on his lungs. Pt wanting to take it easy for a few days. Noted no new orders given from MD regarding therapy parameters. Pt agreeable to EOB exercises using 3# bar. He completed 20 reps 2 sets each exercise given encouragement and instruction. 02 sats 92-96% when assessed between sets. Note that pt required prolonged rest breaks in between sets. Pt hyperverbal and continues to require cues for redirection to task. He remained sitting up, left with all needs within reach and bed alarm set.   Note that pts bed linen had a large blood stain. Per pt, he took off a bandage last night. Noted that pt was no longer bleeding. He did not want to transfer to the chair or have OT notify staff to change linens. Pt wanted to just want for his last therapy session and let staff know he wanted a bed change then. OT informed NT of this.  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, pt able Restrictions Weight Bearing Restrictions: Yes RLE Weight Bearing: Partial weight bearing RLE Partial Weight Bearing Percentage or Pounds: heel only Other Position/Activity Restrictions: Per vascular sx note Vital  Signs: Therapy Vitals Temp: 99.1 F (37.3 C) Temp Source: Oral Pulse Rate: 62 Resp: 18 BP: (!) 109/55 Patient Position (if appropriate): Sitting Oxygen Therapy SpO2: 92 % O2 Device: Nasal Cannula Pain: Pain Assessment Pain Score: 6  ADL: ADL Eating: Set up Where Assessed-Eating: Edge of bed Grooming: Setup Where Assessed-Grooming: Edge of bed Upper Body Bathing: Supervision/safety Where Assessed-Upper Body Bathing: Edge of bed Lower Body Bathing: Minimal assistance Where Assessed-Lower Body Bathing: Edge of bed Upper Body Dressing: Minimal assistance Where Assessed-Upper Body Dressing: Edge of bed Lower Body Dressing: Moderate assistance Where Assessed-Lower Body Dressing: Edge of bed Toileting: Maximal assistance Where Assessed-Toileting: Bedside Commode Toilet Transfer: Moderate assistance Toilet Transfer Method: Squat pivot Toilet Transfer Equipment: Drop arm bedside commode Tub/Shower Transfer: Unable to assess Psychologist, counselling Transfer: Unable to assess    Therapy/Group: Individual Therapy  Tiffinie Caillier A Christin Mccreedy 01/13/2022, 3:47 PM

## 2022-01-12 NOTE — Progress Notes (Incomplete Revision)
Physical Therapy Session Note  Patient Details  Name: Antonio [name]. 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, pt able Restrictions Weight Bearing Restrictions: Yes RLE Weight Bearing: Partial weight bearing RLE Partial Weight Bearing Percentage or Pounds: heel only Other Position/Activity Restrictions: Per vascular sx note General:   Vital Signs: Therapy Vitals Temp: 98.8 F (37.1 C) Temp Source: Oral Pulse Rate: 67 Resp: 16 BP: 121/72 Patient Position (if appropriate): Sitting Oxygen Therapy SpO2: (!) 89 % O2 Device: Nasal Cannula O2 Flow Rate (L/min): 2 L/min Pain:   Mobility:   Locomotion :    Trunk/Postural Assessment :    Balance:   Exercises:   Other Treatments:      Therapy/Group: Individual Therapy  Loel Dubonnet PT, DPT 01/12/2022, 5:18 PM

## 2022-01-13 DIAGNOSIS — J810 Acute pulmonary edema: Secondary | ICD-10-CM

## 2022-01-13 LAB — GLUCOSE, CAPILLARY
Glucose-Capillary: 152 mg/dL — ABNORMAL HIGH (ref 70–99)
Glucose-Capillary: 135 mg/dL — ABNORMAL HIGH (ref 70–99)
Glucose-Capillary: 154 mg/dL — ABNORMAL HIGH (ref 70–99)
Glucose-Capillary: 158 mg/dL — ABNORMAL HIGH (ref 70–99)

## 2022-01-13 MED ORDER — FUROSEMIDE 10 MG/ML IJ SOLN
40.0000 mg | Freq: Two times a day (BID) | INTRAMUSCULAR | Status: DC
  Administered 2022-01-13 – 2022-01-22 (×19): 40 mg via INTRAVENOUS
  Filled 2022-01-13 (×20): qty 4

## 2022-01-13 NOTE — Progress Notes (Signed)
Physical Therapy Session Note  Patient Details  Name: Antonio [name]. MRN: [id number] Date of Birth: 18-Mar-1956  Today's Date: 01/13/2022 PT Individual Time: [id number] PT Individual Time Calculation (min): 25 min   Short Term Goals: Week 1:  PT Short Term Goal 1 (Week 1): Pt will perform transfers with supervision PT Short Term Goal 2 (Week 1): Pt will stand with RW PT Short Term Goal 3 (Week 1): Pt will initiate pre gait activities  Skilled Therapeutic Interventions/Progress Updates:   Pt received reclined in bed and agreeable to PT. Pt returned sitting up EOB without assist and heavy use of bed rails. PT assessed SpO2 84% on the L hand. Switched to R hand and reading 91%. Pt performed BLE therex: hip abduction/adduction. Hip fleixon. Isometric abduction, and quad sets. Each performed x 12 BLE. PT continued to assess SpO2 on the R hand with intermittent readings of 87%, increased to >88% with pursed lip breathing and short rest breaks. Throughout session, pt maintained on 4L/min O2. Requesting pain meds at end of session, RN aware.       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, pt able Restrictions Weight Bearing Restrictions: Yes RLE Weight Bearing: Partial weight bearing RLE Partial Weight Bearing Percentage or Pounds: heel only Other Position/Activity Restrictions: Per vascular sx note    Vital Signs: Therapy Vitals Temp: 99.1 F (37.3 C) Temp Source: Oral Pulse Rate: 62 Resp: 18 BP: (!) 109/55 Patient Position (if appropriate): Sitting Oxygen Therapy SpO2: 92 % O2 Device: Nasal Cannula Pain: Pain Assessment Pain Scale: 0-10 Pain Score: 8  Pain Type: Acute pain Pain Location: Groin Pain Orientation: Right Pain Descriptors / Indicators: Aching Patients Stated Pain Goal: 3 Pain Intervention(s): Medication (See eMAR)   Therapy/Group: Individual Therapy  Lorie Phenix 01/13/2022, 4:34 PM

## 2022-01-13 NOTE — Progress Notes (Signed)
PROGRESS NOTE   Subjective/Complaints: Per PT required 4.5-5L during therapy , was requiring 6L last noc, net output ~465ml yesterday  ROS:  Pt denies SOB, abd pain, CP, N/V/C/D, and vision changes      Objective:   DG Chest 2 View  Result Date: 01/12/2022 CLINICAL DATA:  Hypoxia. EXAM: CHEST - 2 VIEW COMPARISON:  Radiograph 01/05/2022, CT 12/28/2021 FINDINGS: Lower lung volumes from prior exam. Small bilateral pleural effusions have developed from prior exam. Associated streaky bibasilar opacities. There is vascular congestion with question of pulmonary edema. Heart size difficult to assess due to low lung volumes and bibasilar opacities, may be mildly enlarged. No pneumothorax IMPRESSION: 1. Lower lung volumes from prior exam. 2. Small bilateral pleural effusions and bibasilar opacities, likely atelectasis. 3. Vascular congestion with question of pulmonary edema. Suspected degree of fluid overload/CHF. Electronically Signed   By: Narda Rutherford M.D.   On: 01/12/2022 21:49   No results for input(s): WBC, HGB, HCT, PLT in the last 72 hours.  No results for input(s): NA, K, CL, CO2, GLUCOSE, BUN, CREATININE, CALCIUM in the last 72 hours.   Intake/Output Summary (Last 24 hours) at 01/13/2022 0917 Last data filed at 01/13/2022 0727 Gross per 24 hour  Intake 840 ml  Output 975 ml  Net -135 ml         Physical Exam: Vital Signs Blood pressure (!) 115/57, pulse 61, temperature 98.8 F (37.1 C), temperature source Oral, resp. rate 17, height 6' (1.829 m), weight 93 kg, SpO2 91 %.   General: No acute distress Mood and affect are appropriate Heart: Regular rate and rhythm no rubs murmurs or extra sounds Lungs: reduced BS at bases breathing unlabored, occ wheezes Abdomen: Positive bowel sounds, soft nontender to palpation, nondistended Extremities: No clubbing, cyanosis, or edema Skin: No evidence of breakdown, no evidence  of ras  Genitourinary:    Comments: Scrotum moderately swollen- pt says was size of grapefruit- is now large navel orange- no change Musculoskeletal: TTP across L shoulder- c/w RTC injjry- posterior pain    Comments: L BKA healed well- no redness R TMA- in ACE wrap Lacking 30 degrees of L knee extension- contracture- painful to try and extend further UE 5/5  B/L  RLE- HF/KE/KF 5/5; NT distally LLE- HF 5/5- cannot test knee due to contracture  Skin:    Comments: Right TMA dressing in place.  Appropriately tender- covered with ACE wrap Significant Clubbing of fingernails B/L  PICC in R upper arm- signs of blood outside PICC/surrounding it  Neurological:     Comments: Patient is alert.  No acute distress and follows commands.  Oriented x3. Intact to light touch I UE however decreased below knees B/L   Assessment/Plan: 1. Functional deficits which require 3+ hours per day of interdisciplinary therapy in a comprehensive inpatient rehab setting. Physiatrist is providing close team supervision and 24 hour management of active medical problems listed below. Physiatrist and rehab team continue to assess barriers to discharge/monitor patient progress toward functional and medical goals  Care Tool:  Bathing    Body parts bathed by patient: Right arm, Left arm, Chest, Abdomen, Front perineal area, Buttocks, Right upper leg, Left  upper leg, Face   Body parts bathed by helper: Right lower leg Body parts n/a: Left lower leg, Right lower leg   Bathing assist Assist Level: Supervision/Verbal cueing     Upper Body Dressing/Undressing Upper body dressing   What is the patient wearing?: Pull over shirt    Upper body assist Assist Level: Set up assist    Lower Body Dressing/Undressing Lower body dressing      What is the patient wearing?: Pants     Lower body assist Assist for lower body dressing: Moderate Assistance - Patient 50 - 74%     Toileting Toileting    Toileting assist  Assist for toileting: Independent with assistive device Assistive Device Comment: urinal   Transfers Chair/bed transfer  Transfers assist  Chair/bed transfer activity did not occur: Safety/medical concerns  Chair/bed transfer assist level: Moderate Assistance - Patient 50 - 74%     Locomotion Ambulation   Ambulation assist   Ambulation activity did not occur: Safety/medical concerns          Walk 10 feet activity   Assist  Walk 10 feet activity did not occur: Safety/medical concerns        Walk 50 feet activity   Assist Walk 50 feet with 2 turns activity did not occur: Safety/medical concerns         Walk 150 feet activity   Assist Walk 150 feet activity did not occur: Safety/medical concerns         Walk 10 feet on uneven surface  activity   Assist Walk 10 feet on uneven surfaces activity did not occur: Safety/medical concerns         Wheelchair     Assist Is the patient using a wheelchair?: Yes Type of Wheelchair: Manual    Wheelchair assist level: Supervision/Verbal cueing Max wheelchair distance: 100 ft    Wheelchair 50 feet with 2 turns activity    Assist        Assist Level: Supervision/Verbal cueing   Wheelchair 150 feet activity     Assist      Assist Level: Moderate Assistance - Patient 50 - 74%   Blood pressure (!) 115/57, pulse 61, temperature 98.8 F (37.1 C), temperature source Oral, resp. rate 17, height 6' (1.829 m), weight 93 kg, SpO2 91 %.  Medical Problem List and Plan: 1. 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  Note specifies right TMA dressing to remain in place until 2/13 as well as history of left BKA 2019             -patient may  shower if cover R TMA/RLE             -ELOS/Goals: 7-10 days mod I-   Con't CIR_ PT and OT 2.  Antithrombotics: -DVT/anticoagulation:  Pharmaceutical: Lovenox              -antiplatelet therapy: Aspirin 81 mg daily and Plavix 75 mg daily 3. Pain Management: Dilaudid 4 mg every 4 hours as needed  2/15- will order 1x/day- daily scheduled pain meds at 7:30am so can participate in therapy.   2/16- pt wants scheduled pain meds d/c;d   2/17- will add Voltaren gel 2G QID for L shoulder pain  4. Mood: Provide emotional support             -antipsychotic agents: N/A 5. Neuropsych: This patient is capable of making decisions on his own behalf. 6. Skin/Wound Care: Routine skin checks  7. Fluids/Electrolytes/Nutrition: Routine in and outs with follow-up chemistries 8.  Diabetes mellitus.  Hemoglobin A1c 11.3.  Semglee 24 units daily.  Check blood sugars before meals and at bedtime  CBG (last 3)  Recent Labs    01/12/22 1639 01/12/22 2113 01/13/22 0605  GLUCAP 158* 118* 152*  Controlled 2/18  9.  Hypertension.  Hydralazine 25 mg every 8 hours, Coreg 25 mg twice daily, Norvasc 10 mg daily, Avapro 75 mg daily.  Monitor with increased mobility   Vitals:   01/12/22 1957 01/13/22 0353  BP: (!) 146/70 (!) 115/57  Pulse: 67 61  Resp: 17 17  Temp: 98.5 F (36.9 C) 98.8 F (37.1 C)  SpO2: (!) 88% 91%  Controlled 2/18  10.  Hyperlipidemia.  Lipitor 11.  CAD with stenting.  Continue aspirin and Plavix 12.  History of tobacco use.  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  13. L knee contracture- has BKA prosthesis- don't see a way to fix-  14. Scrotal swelling- since iliofemoral endarterectomy- will elevate.  15. New O2 requirement- will see if can wean off-   2/16- off O2 16. RUE PICC- will see if able to get out?  2/16- will order for removal 17. Loose stools  2/16- will make colace prn and change senna to 1 tab BID- and have him hold bowel meds today  2/17- doing better- con't regimen 18.  Hypoxia due to fluid overload , ECHO `2wks ago looked nl although some technical limitations , responding to Lasix went from 6L to 4.5L O2, will increase IV Lasix to BID    LOS: 4  days A FACE TO FACE EVALUATION WAS PERFORMED  Erick Colace 01/13/2022, 9:17 AM

## 2022-01-13 NOTE — Progress Notes (Signed)
Physical Therapy Session Note  Patient Details  Name: Antonio [name]. MRN: [id number] Date of Birth: July 14, 1956  Today's Date: 01/13/2022 PT Individual Time: 0805-0901 PT Individual Time Calculation (min): 56 min   Short Term Goals: Week 1:  PT Short Term Goal 1 (Week 1): Pt will perform transfers with supervision PT Short Term Goal 2 (Week 1): Pt will stand with RW PT Short Term Goal 3 (Week 1): Pt will initiate pre gait activities    Skilled Therapeutic Interventions/Progress Updates:   Pt received sitting up in bed and agreeable to PT. Pt performed sitting balance EOB with superviison assist- mod I with intermittent UE support on bed rail on the L. Pt performed scooting to L and scooting to the R with supervision assist and WB through heel and midfoot on the RLE. Verbal cues for WB through heel only.  Seated BLE therex: isometric hip abduction x 12, reciprocal march x 12, LAQ x 12, hip extension from flexed position x 10. Prolonged therapeutic rest break between bouts with SpO2 assessment. HR 78, SpO2 95% at 5 L/min. Pt reduced O2 to 4.5L/min and SpO2 desat to only 91% with activity and rebounded to 94% with 30sec rest. Pt left semi reclined in bed with call bell in reach. Per pt request, bed alarm not set, NT notified.      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, pt able Restrictions Weight Bearing Restrictions: Yes RLE Weight Bearing: Partial weight bearing RLE Partial Weight Bearing Percentage or Pounds: heel only Other Position/Activity Restrictions: Per vascular sx note  Pain: Pain Assessment Pain Scale: 0-10 Pain Score: 8  Pain Type: Acute pain Pain Location: Groin Pain Orientation: Right Pain Descriptors / Indicators: Aching Pain Onset: On-going    Therapy/Group: Individual Therapy  Lorie Phenix 01/13/2022, 9:04 AM

## 2022-01-13 NOTE — Progress Notes (Signed)
Occupational Therapy Session Note  Patient Details  Name: Antonio [name]. MRN: [id number] Date of Birth: 03/12/1956  Today's Date: 01/13/2022 OT Individual Time: 1100-1200 OT Individual Time Calculation (min): 60 min    Short Term Goals: Week 1:  OT Short Term Goal 1 (Week 1): Pt will complete LB dressing via lateral leans with supervision OT Short Term Goal 2 (Week 1): Pt will complete sit > stand in prep for ADL with Max A OT Short Term Goal 3 (Week 1): Pt will complete pericare with CGA for increased independence with toileting self-care  Skilled Therapeutic Interventions/Progress Updates:    Pt received in bed with Ex wife and daughter present . No ADL needs and RN provided voltaren gel for unrated pain in shoudlers. Pt declines any OOB/out of room tx but agreeable to EOB arm exercise.   Therapeutic exercise Pt completes 2x10-15 dowel rod therex for BUE shoulder strengthening required for BADLs/functional transfers as follows with demo cuing and 3 # dowel rod  Shoulder flex/ext, shoulder press, horizonal ab/adduct Circles in B directions Elbow flex/ext, chest press Wrist flex/ext  Pt SIGNIFICANTLY HYPERVERBAL & TANGENTIAL postponing exercise despite redirection as O2 sats were >93% on 5L O2.    Pt left at end of session in bed with exit alarm on, call light in reach and all needs met   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, pt able Restrictions Weight Bearing Restrictions: Yes RLE Weight Bearing: Partial weight bearing RLE Partial Weight Bearing Percentage or Pounds: heel only Other Position/Activity Restrictions: Per vascular sx note   Therapy/Group: Individual Therapy  Tonny Branch 01/13/2022, 6:56 AM

## 2022-01-14 LAB — BASIC METABOLIC PANEL
Anion gap: 9 (ref 5–15)
BUN: 36 mg/dL — ABNORMAL HIGH (ref 8–23)
CO2: 28 mmol/L (ref 22–32)
Calcium: 7.8 mg/dL — ABNORMAL LOW (ref 8.9–10.3)
Chloride: 100 mmol/L (ref 98–111)
Creatinine, Ser: 1.54 mg/dL — ABNORMAL HIGH (ref 0.61–1.24)
GFR, Estimated: 50 mL/min — ABNORMAL LOW (ref >60)
Glucose, Bld: 142 mg/dL — ABNORMAL HIGH (ref 70–99)
Potassium: 4.1 mmol/L (ref 3.5–5.1)
Sodium: 137 mmol/L (ref 135–145)

## 2022-01-14 LAB — GLUCOSE, CAPILLARY
Glucose-Capillary: 135 mg/dL — ABNORMAL HIGH (ref 70–99)
Glucose-Capillary: 136 mg/dL — ABNORMAL HIGH (ref 70–99)
Glucose-Capillary: 144 mg/dL — ABNORMAL HIGH (ref 70–99)
Glucose-Capillary: 192 mg/dL — ABNORMAL HIGH (ref 70–99)

## 2022-01-14 NOTE — Progress Notes (Signed)
Physical Therapy Session Note  Patient Details  Name: Antonio [name]. MRN: [id number] Date of Birth: 29-Apr-1956  Today's Date: 01/14/2022 PT Individual Time: [id number] PT Individual Time Calculation (min): 10 min   Short Term Goals: Week 1:  PT Short Term Goal 1 (Week 1): Pt will perform transfers with supervision PT Short Term Goal 2 (Week 1): Pt will stand with RW PT Short Term Goal 3 (Week 1): Pt will initiate pre gait activities  Skilled Therapeutic Interventions/Progress Updates:    Pt received seated EOB in room, reports he is having pain in B shoulders due to arthritis. Assisted pt with applying Voltaren gel for pain management. Encouraged pt to perform shoulder rolls for gentle stretching of joint, pt declined. Encouraged pt to transfer out of bed or even perform bed level exercises this date. Pt declines 2/2 pain and fatigue. Attempted to educate pt on purpose of therapy to pursue functional mobility goals so that he can work towards d/c home, pt continues to decline participation in therapy this date. Pt left seated EOB in room with needs in reach. Pt missed 35 min of scheduled skilled therapy session due to refusal.  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, pt able Restrictions Weight Bearing Restrictions: Yes RLE Weight Bearing: Partial weight bearing RLE Partial Weight Bearing Percentage or Pounds: Heel only Other Position/Activity Restrictions: Per vascular sx note General: PT Amount of Missed Time (min): 35 Minutes PT Missed Treatment Reason: Patient unwilling to participate      Therapy/Group: Individual Therapy   Excell Seltzer, PT, DPT, CSRS  01/14/2022, 4:51 PM

## 2022-01-14 NOTE — Progress Notes (Signed)
Occupational Therapy Session Note  Patient Details  Name: Antonio [name]. MRN: [id number] Date of Birth: [birth year]  Today's Date: 01/15/2022 OT Individual Time: 1105-1207 and 8756-4332 OT Individual Time Calculation (min): 62 min and 72 min   Short Term Goals: Week 1:  OT Short Term Goal 1 (Week 1): Pt will complete LB dressing via lateral leans with supervision OT Short Term Goal 2 (Week 1): Pt will complete sit > stand in prep for ADL with Max A OT Short Term Goal 3 (Week 1): Pt will complete pericare with CGA for increased independence with toileting self-care  Skilled Therapeutic Interventions/Progress Updates:    Pt greeted in bed, premedicated for pain. Agreeable to session, declining bathing/dressing. Squat pivot<w/c completed with CGA. Note that pt adamantly refused to don his offloading shoe prior to transferring (vcs provided to only bear weight through heel). Pt also adamantly refused to attempt to don shoe himself post transfer for OT goals. Pt was then taken via w/c to the sink. OT washed his hair using hair washing tray and then he combed it. Setup assist for oral care and grooming tasks in front of sink at seated level. He required cues to complete tasks at max level of independence. Min A for donning overhead shirt. Pt then reported needing to urinate, adamantly refused to use the toilet or BSC, wanted the urinal. Setup for toileting using urinal. When he was finished, guided pt through LE flutter kicks x1 minute holds 3 sets to build strength needed to increase functional independence with sit<stands and transfers. Pt throughout session required rest breaks to "get air." Min-mod cues for 02 mgt. 02 sats prior to 02 departure 94% on 4L.   Note that unit is out of supply of 02 caddies, therefore session needed to take place in pts room  2nd Session 1:1 tx (72 min) Pt greeted in the w/c, agreeable to session. No 02 caddy available so session completed in the room. Pt guided  through UB exercises to improve strength/endurance for carryover during functional tasks. Pt completed B UE therapeutic exercise using red tband/3# bar x20 reps 2 sets each exercise. Note that pt required prolonged rest breaks in between sets. 02 sats on 4L 92-96% when assessed at rest. Pt required encouragement to participate with maximum effort. Voltaren gel applied to shoulders for pain mgt, nurse also in during session to provide pain medicine. CGA for squat pivot<bed afterwards. 02 sats post transfer 95%. He remained in bed at close of session, all needs within reach and bed alarm set.   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, pt able Restrictions Weight Bearing Restrictions: Yes RLE Weight Bearing: Non weight bearing RLE Partial Weight Bearing Percentage or Pounds: heel Other Position/Activity Restrictions: Per vascular sx note Pain: Pain Assessment Pain Scale: 0-10 Pain Score: 7  ADL: ADL Eating: Set up Where Assessed-Eating: Edge of bed Grooming: Setup Where Assessed-Grooming: Edge of bed Upper Body Bathing: Supervision/safety Where Assessed-Upper Body Bathing: Edge of bed Lower Body Bathing: Minimal assistance Where Assessed-Lower Body Bathing: Edge of bed Upper Body Dressing: Minimal assistance Where Assessed-Upper Body Dressing: Edge of bed Lower Body Dressing: Moderate assistance Where Assessed-Lower Body Dressing: Edge of bed Toileting: Maximal assistance Where Assessed-Toileting: Bedside Commode Toilet Transfer: Moderate assistance Toilet Transfer Method: Squat pivot Toilet Transfer Equipment: Drop arm bedside commode Tub/Shower Transfer: Unable to assess Psychologist, counselling Transfer: Unable to assess Therapy/Group: Individual Therapy  Cavon Nicolls A Hodan Wurtz  01/15/2022, 12:42 PM

## 2022-01-15 ENCOUNTER — Inpatient Hospital Stay (HOSPITAL_COMMUNITY): Payer: Medicaid Other

## 2022-01-15 LAB — CBC WITH DIFFERENTIAL/PLATELET
Abs Immature Granulocytes: 0.04 10*3/uL (ref 0.00–0.07)
Basophils Absolute: 0 10*3/uL (ref 0.0–0.1)
Basophils Relative: 0 %
Eosinophils Absolute: 0.1 10*3/uL (ref 0.0–0.5)
Eosinophils Relative: 1 %
HCT: 28.3 % — ABNORMAL LOW (ref 39.0–52.0)
Hemoglobin: 8.9 g/dL — ABNORMAL LOW (ref 13.0–17.0)
Immature Granulocytes: 1 %
Lymphocytes Relative: 15 %
Lymphs Abs: 1.2 10*3/uL (ref 0.7–4.0)
MCH: 28.7 pg (ref 26.0–34.0)
MCHC: 31.4 g/dL (ref 30.0–36.0)
MCV: 91.3 fL (ref 80.0–100.0)
Monocytes Absolute: 0.8 10*3/uL (ref 0.1–1.0)
Monocytes Relative: 9 %
Neutro Abs: 6.4 10*3/uL (ref 1.7–7.7)
Neutrophils Relative %: 74 %
Platelets: 209 10*3/uL (ref 150–400)
RBC: 3.1 MIL/uL — ABNORMAL LOW (ref 4.22–5.81)
RDW: 12.9 % (ref 11.5–15.5)
WBC: 8.6 10*3/uL (ref 4.0–10.5)
nRBC: 0 % (ref 0.0–0.2)

## 2022-01-15 LAB — COMPREHENSIVE METABOLIC PANEL
ALT: 17 U/L (ref 0–44)
AST: 27 U/L (ref 15–41)
Albumin: 1.9 g/dL — ABNORMAL LOW (ref 3.5–5.0)
Alkaline Phosphatase: 143 U/L — ABNORMAL HIGH (ref 38–126)
Anion gap: 8 (ref 5–15)
BUN: 45 mg/dL — ABNORMAL HIGH (ref 8–23)
CO2: 28 mmol/L (ref 22–32)
Calcium: 8 mg/dL — ABNORMAL LOW (ref 8.9–10.3)
Chloride: 99 mmol/L (ref 98–111)
Creatinine, Ser: 1.84 mg/dL — ABNORMAL HIGH (ref 0.61–1.24)
GFR, Estimated: 41 mL/min — ABNORMAL LOW (ref >60)
Glucose, Bld: 167 mg/dL — ABNORMAL HIGH (ref 70–99)
Potassium: 4.2 mmol/L (ref 3.5–5.1)
Sodium: 135 mmol/L (ref 135–145)
Total Bilirubin: 0.3 mg/dL (ref 0.3–1.2)
Total Protein: 6.1 g/dL — ABNORMAL LOW (ref 6.5–8.1)

## 2022-01-15 LAB — GLUCOSE, CAPILLARY
Glucose-Capillary: 138 mg/dL — ABNORMAL HIGH (ref 70–99)
Glucose-Capillary: 138 mg/dL — ABNORMAL HIGH (ref 70–99)
Glucose-Capillary: 113 mg/dL — ABNORMAL HIGH (ref 70–99)
Glucose-Capillary: 145 mg/dL — ABNORMAL HIGH (ref 70–99)

## 2022-01-15 NOTE — Progress Notes (Signed)
Physical Therapy Session Note  Patient Details  Name: Antonio [name]. MRN: [id number] Date of Birth: [birth year]  Today's Date: 01/15/2022 PT Individual Time: 0905-0958 PT Individual Time Calculation (min): 53 min   Short Term Goals: Week 1:  PT Short Term Goal 1 (Week 1): Pt will perform transfers with supervision PT Short Term Goal 2 (Week 1): Pt will stand with RW PT Short Term Goal 3 (Week 1): Pt will initiate pre gait activities  Skilled Therapeutic Interventions/Progress Updates:   pt received in bed and agreeable to therapy. Pt reports fatigue from just getting out of bed for toileting and requests bed level activity at this time. Reports 8/10 pain in groin, premedicated. Assisted pt with applying voltaren gel to BIL shoulders. Rest and positioning provided as needed. Pt noted to bed hovering at 87-88% on 4.5 L supp O2. Dropped to 84% after using incentive spirometer but rebounded quickly. Discussed breathing techniques such as pursed lip breathing and not talking during activity. Pt required extended rest breaks.  Pt performed the following exercises to promote LE strength and endurance:  SLR with LLE 2 x 20  Supine hip abduction, with LLE, 3 x 10 LAQ with RLE 3 x 15 LLE SAQ with manual resistance x 10  Attempted several activities with R hip, but could not without incr groin pain. Discussed keeping RLE elevated for edema management. RN arrived to provide medication and therapist handed off care at end of session.   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, pt able Restrictions Weight Bearing Restrictions: Yes RLE Weight Bearing: Non weight bearing RLE Partial Weight Bearing Percentage or Pounds: Heel only Other Position/Activity Restrictions: Per vascular sx note    Therapy/Group: Individual Therapy  Juluis Rainier 01/15/2022, 9:21 AM

## 2022-01-15 NOTE — Progress Notes (Signed)
PROGRESS NOTE   Subjective/Complaints:  Pt reports O2 is new- some SOB this weekend, but more with activity- not at rest. On 4L O2 at rest and was up to 6L with therapy this weekend.   Doesn't remember when had LBM, but doesn't want laxatives.    ROS:  Pt denies SOB at rest- has some DOE;  abd pain, CP, N/V/C/D, and vision changes      Objective:   DG CHEST PORT 1 VIEW  Result Date: 01/15/2022 CLINICAL DATA:  Recent toe amputation, shortness of breath, cough, peripheral edema, diabetes EXAM: PORTABLE CHEST 1 VIEW COMPARISON:  01/12/2022 FINDINGS: Similar low lung volumes with bibasilar airspace opacities versus basilar edema. There is also associated scattered lower lobe atelectasis and small effusions. No significant change compared to 01/12/2022. No pneumothorax. Heart is obscured. Trachea midline. No significant osseous finding. IMPRESSION: Persistent low lung volumes with small effusions and basilar opacities versus edema. Similar bibasilar atelectasis. Electronically Signed   By: Judie Petit.  Shick M.D.   On: 01/15/2022 09:15   Recent Labs    01/15/22 0512  WBC 8.6  HGB 8.9*  HCT 28.3*  PLT 209   Recent Labs    01/14/22 0549 01/15/22 0512  NA 137 135  K 4.1 4.2  CL 100 99  CO2 28 28  GLUCOSE 142* 167*  BUN 36* 45*  CREATININE 1.54* 1.84*  CALCIUM 7.8* 8.0*    Intake/Output Summary (Last 24 hours) at 01/15/2022 0935 Last data filed at 01/15/2022 0801 Gross per 24 hour  Intake 360 ml  Output 725 ml  Net -365 ml        Physical Exam: Vital Signs Blood pressure (!) 115/58, pulse (!) 58, temperature 97.8 F (36.6 C), temperature source Oral, resp. rate 20, height 6' (1.829 m), weight 93 kg, SpO2 93 %.    General: awake, alert, appropriate, hoarse chronically- previous trach; O2 4L O2; by [place]; sitting EOB; NAD HENT: conjugate gaze; oropharynx moist CV: regular decreased at R>L base- a few rhonchi heard R>L  side GI: soft, NT, ND, (+)BS- hypoactive Psychiatric: appropriate Neurological: Ox3 Genitourinary:    Comments: Scrotum moderately swollen- pt says was size of grapefruit- is now large navel orange- no change Musculoskeletal: TTP across L shoulder- c/w RTC injjry- posterior pain    Comments: L BKA healed well- no redness R TMA- in ACE wrap Lacking 30 degrees of L knee extension- contracture- painful to try and extend further UE 5/5  B/L  RLE- HF/KE/KF 5/5; NT distally LLE- HF 5/5- cannot test knee due to contracture  Skin:    Comments: Right TMA dressing in place.  Appropriately tender- covered with ACE wrap Significant Clubbing of fingernails B/L  PICC in R upper arm- signs of blood outside PICC/surrounding it  Neurological:     Comments: Patient is alert.  No acute distress and follows commands.  Oriented x3. Intact to light touch I UE however decreased below knees B/L   Assessment/Plan: 1. Functional deficits which require 3+ hours per day of interdisciplinary therapy in a comprehensive inpatient rehab setting. Physiatrist is providing close team supervision and 24 hour management of active medical problems listed below. Physiatrist and rehab team  continue to assess barriers to discharge/monitor patient progress toward functional and medical goals  Care Tool:  Bathing    Body parts bathed by patient: Right arm, Left arm, Chest, Abdomen, Front perineal area, Buttocks, Right upper leg, Left upper leg, Face   Body parts bathed by helper: Right lower leg Body parts n/a: Left lower leg, Right lower leg   Bathing assist Assist Level: Supervision/Verbal cueing     Upper Body Dressing/Undressing Upper body dressing   What is the patient wearing?: Pull over shirt    Upper body assist Assist Level: Set up assist    Lower Body Dressing/Undressing Lower body dressing      What is the patient wearing?: Pants     Lower body assist Assist for lower body dressing: Moderate  Assistance - Patient 50 - 74%     Toileting Toileting    Toileting assist Assist for toileting: Independent with assistive device Assistive Device Comment: urinal   Transfers Chair/bed transfer  Transfers assist  Chair/bed transfer activity did not occur: Safety/medical concerns  Chair/bed transfer assist level: Moderate Assistance - Patient 50 - 74%     Locomotion Ambulation   Ambulation assist   Ambulation activity did not occur: Safety/medical concerns          Walk 10 feet activity   Assist  Walk 10 feet activity did not occur: Safety/medical concerns        Walk 50 feet activity   Assist Walk 50 feet with 2 turns activity did not occur: Safety/medical concerns         Walk 150 feet activity   Assist Walk 150 feet activity did not occur: Safety/medical concerns         Walk 10 feet on uneven surface  activity   Assist Walk 10 feet on uneven surfaces activity did not occur: Safety/medical concerns         Wheelchair     Assist Is the patient using a wheelchair?: Yes Type of Wheelchair: Manual    Wheelchair assist level: Supervision/Verbal cueing Max wheelchair distance: 100 ft    Wheelchair 50 feet with 2 turns activity    Assist        Assist Level: Supervision/Verbal cueing   Wheelchair 150 feet activity     Assist      Assist Level: Moderate Assistance - Patient 50 - 74%   Blood pressure (!) 115/58, pulse (!) 58, temperature 97.8 F (36.6 C), temperature source Oral, resp. rate 20, height 6' (1.829 m), weight 93 kg, SpO2 93 %.  Medical Problem List and Plan: 1. 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  Note specifies right TMA dressing to remain in place until 2/13 as well as history of left BKA 2019             -patient may  shower if cover R TMA/RLE             -ELOS/Goals: 7-10 days mod I-   Con't CIR_ PT and  OT 2.  Antithrombotics: -DVT/anticoagulation:  Pharmaceutical: Lovenox             -antiplatelet therapy: Aspirin 81 mg daily and Plavix 75 mg daily 3. Pain Management: Dilaudid 4 mg every 4 hours as needed  2/15- will order 1x/day- daily scheduled pain meds at 7:30am so can participate in therapy.   2/16- pt wants scheduled pain meds d/c;d   2/17- will add Voltaren gel 2G QID for L shoulder  pain   2/20- pain is adequate controlled- con't regimen 4. Mood: Provide emotional support             -antipsychotic agents: N/A 5. Neuropsych: This patient is capable of making decisions on his own behalf. 6. Skin/Wound Care: Routine skin checks 7. Fluids/Electrolytes/Nutrition: Routine in and outs with follow-up chemistries 8.  Diabetes mellitus.  Hemoglobin A1c 11.3.  Semglee 24 units daily.  Check blood sugars before meals and at bedtime  CBG (last 3)  Recent Labs    01/14/22 1645 01/14/22 2114 01/15/22 0655  GLUCAP 144* 192* 138*   2/20- CBGs controlled- cont' regimen 9.  Hypertension.  Hydralazine 25 mg every 8 hours, Coreg 25 mg twice daily, Norvasc 10 mg daily, Avapro 75 mg daily.  Monitor with increased mobility   Vitals:   01/14/22 2300 01/15/22 0548  BP:  (!) 115/58  Pulse:  (!) 58  Resp:  20  Temp:  97.8 F (36.6 C)  SpO2: 93% 93%  Controlled 2/18  10.  Hyperlipidemia.  Lipitor 11.  CAD with stenting.  Continue aspirin and Plavix 12.  History of tobacco use.  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  13. L knee contracture- has BKA prosthesis- don't see a way to fix-  14. Scrotal swelling- since iliofemoral endarterectomy- will elevate.  15. New O2 requirement- will see if can wean off-   2/16- off O2  2/20- back on O2 as below #18 16. RUE PICC- will see if able to get out?  2/16- will order for removal 17. Loose stools  2/16- will make colace prn and change senna to 1 tab BID- and have him hold bowel meds today  2/17- doing better- con't regimen 18.  Hypoxia due to fluid overload ,  ECHO `2wks ago looked nl although some technical limitations , responding to Lasix went from 6L to 4.5L O2, will increase IV Lasix to BID   2/20- willa dd flutter valve due to low lung volumes and ask nursing to give albuterol prn nebs. Also, 19. AKI  2/20- Cr risen from 1.54 to 1.8- due to increase in Lasix. However O2 requirement is less- will monitor closely.    I spent a total of 39   minutes on total care today- >50% coordination of care- due to CXR, d/w nursing, reviewing CXR and films myself- and labs-    LOS: 6 days A FACE TO FACE EVALUATION WAS PERFORMED  Kelsen Celona 01/15/2022, 9:35 AM

## 2022-01-16 LAB — GLUCOSE, CAPILLARY
Glucose-Capillary: 121 mg/dL — ABNORMAL HIGH (ref 70–99)
Glucose-Capillary: 118 mg/dL — ABNORMAL HIGH (ref 70–99)
Glucose-Capillary: 103 mg/dL — ABNORMAL HIGH (ref 70–99)
Glucose-Capillary: 184 mg/dL — ABNORMAL HIGH (ref 70–99)

## 2022-01-16 NOTE — Progress Notes (Signed)
Physical Therapy Session Note  Patient Details  Name: Antonio [name]. MRN: [id number] Date of Birth: [birth year]  Today's Date: 01/16/2022 PT Individual Time: 3845-3646 PT Individual Time Calculation (min): 35 min   Short Term Goals: Week 1:  PT Short Term Goal 1 (Week 1): Pt will perform transfers with supervision PT Short Term Goal 2 (Week 1): Pt will stand with RW PT Short Term Goal 3 (Week 1): Pt will initiate pre gait activities  Skilled Therapeutic Interventions/Progress Updates:   Pt received supported long sitting in the bed with nurse present for morning medications. Pt agreeable to therapy session. Pt received on 3.5L of O2 via nasal cannula with SpO2 90% or higher during session. With encouragement, pt agreeable to transfer OOB; however, requests to use urinal first and for therapist to provide him privacy. Pt called for therapist to return to room when he reports sudden urgency to have BM. Donned L LE prosthetic max assist for time management. Pt refused to wear R LE DARCO shoe despite therapist education on WBing restrictions with encouragement to don it.   L partial stand pivot EOB>BSC with heavy min assist for lifting hips and balance - pt not receptive to therapist cuing to maintain R LE WBing restrictions. Pt performed push up through arms while therapist doffed pants total assist. Pt continent of BM. Pt took his nasal cannula off while toileting and with min encouragement agreeable to put it back in place.   Therapist offered to assist with peri-care when pt suddenly/impulsively stands up and turns to face the bed holding onto bedrails with his hands for therapist to perform peri-care despite therapist vocalizing to sit back down due to R LE WBing restrictions but pt adamant for therapist to complete hygiene in this position - therapist quickly completed this and provided light mod assist for pt to rotate and sit hips on EOB.   Therapist reinforced education to pt on R LE  WBing restrictions with purpose of those restrictions and importance of wearing DARCO shoe with pt verbalizing understanding but continuing to refuse to wear the shoe.  Pt adamantly requesting to sit on EOB at end of session - pt left with needs in reach, bed alarm on, and lines intact.  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, pt able Restrictions Weight Bearing Restrictions: Yes RLE Weight Bearing: Non weight bearing RLE Partial Weight Bearing Percentage or Pounds: heel Other Position/Activity Restrictions: Per vascular sx note   Pain: No reports of pain throughout session.    Therapy/Group: Individual Therapy  Ginny Forth , PT, DPT, NCS, CSRS  01/16/2022, 7:47 AM

## 2022-01-16 NOTE — Progress Notes (Signed)
Occupational Therapy Session Note ° °Patient Details  °Name: Antonio Watson Rozas Jr. °MRN: [id number] °Date of Birth: 08/06/1956 ° °Today's Date: 01/16/2022 °OT Individual Time: 1530-1605 °OT Individual Time Calculation (min): 35 min  ° ° °Short Term Goals: °Week 1:  OT Short Term Goal 1 (Week 1): Pt will complete LB dressing via lateral leans with supervision °OT Short Term Goal 2 (Week 1): Pt will complete sit > stand in prep for ADL with Max A °OT Short Term Goal 3 (Week 1): Pt will complete pericare with CGA for increased independence with toileting self-care ° °Skilled Therapeutic Interventions/Progress Updates:  °  Pt received EOB agreeable to therapy. He stated he did not want to do any standing or transfers due to groin pain where he had the bypass. Pt agreeable to working on UE strengthening.  Used red theraband for bicep curls, rows, tricep extensions to focus on arm strength for transfers. He participated well. Pt very verbal about his frustrations with family issues right now and that he feels limited support from his mom. ° °Pt resting EOB with alarm on and all needs met.  ° °Therapy Documentation °Precautions:  °Precautions °Precautions: Fall °Precaution Comments: s/p R TMA; prothesis for L prior BKA in room, pt able °Restrictions °Weight Bearing Restrictions: Yes °RLE Weight Bearing: Partial weight bearing °RLE Partial Weight Bearing Percentage or Pounds: heel °Other Position/Activity Restrictions: Per vascular sx note ° °  °Vital Signs: °Therapy Vitals °Temp: (!) 97.4 °F (36.3 °C) °Temp Source: Oral °Pulse Rate: (!) 57 °Resp: 20 °BP: (!) 128/54 °Patient Position (if appropriate): Sitting °Oxygen Therapy °SpO2: 100 % °O2 Device: Nasal Cannula °O2 Flow Rate (L/min): 4 L/min °Pain: °Pain Assessment °Pain Scale: 0-10 °Pain Score: 8  °Pain Intervention(s): Medication (See eMAR) °ADL: °ADL °Eating: Set up °Where Assessed-Eating: Edge of bed °Grooming: Setup °Where Assessed-Grooming: Edge of bed °Upper Body  Bathing: Supervision/safety °Where Assessed-Upper Body Bathing: Edge of bed °Lower Body Bathing: Minimal assistance °Where Assessed-Lower Body Bathing: Edge of bed °Upper Body Dressing: Minimal assistance °Where Assessed-Upper Body Dressing: Edge of bed °Lower Body Dressing: Moderate assistance °Where Assessed-Lower Body Dressing: Edge of bed °Toileting: Maximal assistance °Where Assessed-Toileting: Bedside Commode °Toilet Transfer: Moderate assistance °Toilet Transfer Method: Squat pivot °Toilet Transfer Equipment: Drop arm bedside commode °Tub/Shower Transfer: Unable to assess °Walk-In Shower Transfer: Unable to assess ° ° °Therapy/Group: Individual Therapy ° °, °01/16/2022, 4:18 PM °

## 2022-01-16 NOTE — Progress Notes (Signed)
Patient ID: Antonio [name]., male   DOB: 14-Apr-1956, 66 y.o.   MRN: [id number]  Met with pt to discuss team conference goals of mod/I-supervision and target discharge date of 2/28. Discussed the effort he is putting into his rehab. He reports feeling bad and this is the reason, discussed this is his opportunity to do well and rehab his amputation. Pt voiced his Mom came today and fought with him and he may not go there at discharge. He is mad due to she cut off his phone. Discussed he may need home O2 and will need an address to send it too. He reports his daughter did move in with him in his Mom's basement. Pt has many issues at this time and feels she can manage, although he may be somewhat unrealistic. Will ask neuro-psych to see and see tomorrow after he and Mom have cooled off. He reports he will now more tomorrow. Encouraged him to use his flutter valve

## 2022-01-16 NOTE — Progress Notes (Signed)
Patient continues to report having trouble "getting enough air in". O2 sats at 90-92% on 4L . Patient received PRN albuterol treatment but no improvement noted. Education reinforced on flutter valve use at the bedside. Patient requesting PRN Dilaudid q4h routinely. Reported pain consistently around 8/10. Patient sits up in bed, not sleeping majority of the shift. Rt foot dressing CDI.

## 2022-01-16 NOTE — Progress Notes (Signed)
PROGRESS NOTE   Subjective/Complaints:  Pt reports used flutter valve a few times, but stopped when "didn't help"- also when blew nose, x2 got a lot of blood clots- no active bleeding- explained even though on humidified air, air still dry and can do this.    ROS:  Pt denies worsening SOB, abd pain, CP, N/V/C/D, and vision changes     Objective:   DG CHEST PORT 1 VIEW  Result Date: 01/15/2022 CLINICAL DATA:  Recent toe amputation, shortness of breath, cough, peripheral edema, diabetes EXAM: PORTABLE CHEST 1 VIEW COMPARISON:  01/12/2022 FINDINGS: Similar low lung volumes with bibasilar airspace opacities versus basilar edema. There is also associated scattered lower lobe atelectasis and small effusions. No significant change compared to 01/12/2022. No pneumothorax. Heart is obscured. Trachea midline. No significant osseous finding. IMPRESSION: Persistent low lung volumes with small effusions and basilar opacities versus edema. Similar bibasilar atelectasis. Electronically Signed   By: Judie Petit.  Shick M.D.   On: 01/15/2022 09:15   Recent Labs    01/15/22 0512  WBC 8.6  HGB 8.9*  HCT 28.3*  PLT 209   Recent Labs    01/14/22 0549 01/15/22 0512  NA 137 135  K 4.1 4.2  CL 100 99  CO2 28 28  GLUCOSE 142* 167*  BUN 36* 45*  CREATININE 1.54* 1.84*  CALCIUM 7.8* 8.0*    Intake/Output Summary (Last 24 hours) at 01/16/2022 9323 Last data filed at 01/16/2022 0358 Gross per 24 hour  Intake 240 ml  Output 1000 ml  Net -760 ml        Physical Exam: Vital Signs Blood pressure (!) 116/57, pulse 60, temperature 97.7 F (36.5 C), temperature source Oral, resp. rate 20, height 6' (1.829 m), weight 93 kg, SpO2 90 %.     General: awake, alert, appropriate, sitting up at EOB; NAD HENT: conjugate gaze; oropharynx dry and O2 by - nose also dry CV: regular rate; no JVD Pulmonary: diffuse mild wheezing- also some upper airway  sounds cleared with cough - still a little decreased at bases GI: soft, NT, ND, (+)BS- protuberant vs distended Psychiatric: appropriate Neurological: Ox3-  Genitourinary:    Comments: Scrotum moderately swollen- pt says was size of grapefruit- is now large navel orange- no change Musculoskeletal: TTP across L shoulder- c/w RTC injjry- posterior pain    Comments: L BKA healed well- no redness R TMA- in ACE wrap Lacking 30 degrees of L knee extension- contracture- painful to try and extend further UE 5/5  B/L  RLE- HF/KE/KF 5/5; NT distally LLE- HF 5/5- cannot test knee due to contracture  Skin:    Comments: Right TMA dressing in place.  Appropriately tender- covered with ACE wrap Significant Clubbing of fingernails B/L  PICC in R upper arm- signs of blood outside PICC/surrounding it  Neurological:     Comments: Patient is alert.  No acute distress and follows commands.  Oriented x3. Intact to light touch I UE however decreased below knees B/L   Assessment/Plan: 1. Functional deficits which require 3+ hours per day of interdisciplinary therapy in a comprehensive inpatient rehab setting. Physiatrist is providing close team supervision and 24 hour management of active  medical problems listed below. Physiatrist and rehab team continue to assess barriers to discharge/monitor patient progress toward functional and medical goals  Care Tool:  Bathing    Body parts bathed by patient: Right arm, Left arm, Chest, Abdomen, Front perineal area, Buttocks, Right upper leg, Left upper leg, Face   Body parts bathed by helper: Right lower leg Body parts n/a: Left lower leg, Right lower leg   Bathing assist Assist Level: Supervision/Verbal cueing     Upper Body Dressing/Undressing Upper body dressing   What is the patient wearing?: Pull over shirt    Upper body assist Assist Level: Set up assist    Lower Body Dressing/Undressing Lower body dressing      What is the patient wearing?:  Pants     Lower body assist Assist for lower body dressing: Moderate Assistance - Patient 50 - 74%     Toileting Toileting    Toileting assist Assist for toileting: Independent with assistive device Assistive Device Comment: urinal   Transfers Chair/bed transfer  Transfers assist  Chair/bed transfer activity did not occur: Safety/medical concerns  Chair/bed transfer assist level: Moderate Assistance - Patient 50 - 74%     Locomotion Ambulation   Ambulation assist   Ambulation activity did not occur: Safety/medical concerns          Walk 10 feet activity   Assist  Walk 10 feet activity did not occur: Safety/medical concerns        Walk 50 feet activity   Assist Walk 50 feet with 2 turns activity did not occur: Safety/medical concerns         Walk 150 feet activity   Assist Walk 150 feet activity did not occur: Safety/medical concerns         Walk 10 feet on uneven surface  activity   Assist Walk 10 feet on uneven surfaces activity did not occur: Safety/medical concerns         Wheelchair     Assist Is the patient using a wheelchair?: Yes Type of Wheelchair: Manual    Wheelchair assist level: Supervision/Verbal cueing Max wheelchair distance: 100 ft    Wheelchair 50 feet with 2 turns activity    Assist        Assist Level: Supervision/Verbal cueing   Wheelchair 150 feet activity     Assist      Assist Level: Moderate Assistance - Patient 50 - 74%   Blood pressure (!) 116/57, pulse 60, temperature 97.7 F (36.5 C), temperature source Oral, resp. rate 20, height 6' (1.829 m), weight 93 kg, SpO2 90 %.  Medical Problem List and Plan: 1. 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  Note specifies right TMA dressing to remain in place until 2/13 as well as history of left BKA 2019             -patient may  shower if cover R  TMA/RLE             -ELOS/Goals: 7-10 days mod I-   Continue CIR- PT, OT - team conference today to determine length of stay 2.  Antithrombotics: -DVT/anticoagulation:  Pharmaceutical: Lovenox             -antiplatelet therapy: Aspirin 81 mg daily and Plavix 75 mg daily 3. Pain Management: Dilaudid 4 mg every 4 hours as needed  2/15- will order 1x/day- daily scheduled pain meds at 7:30am so can participate in therapy.   2/16- pt wants scheduled  pain meds d/c;d   2/17- will add Voltaren gel 2G QID for L shoulder pain   2/21- pt taking dilaudid around the clock- per nursing-  4. Mood: Provide emotional support             -antipsychotic agents: N/A 5. Neuropsych: This patient is capable of making decisions on his own behalf. 6. Skin/Wound Care: Routine skin checks 7. Fluids/Electrolytes/Nutrition: Routine in and outs with follow-up chemistries 8.  Diabetes mellitus.  Hemoglobin A1c 11.3.  Semglee 24 units daily.  Check blood sugars before meals and at bedtime  CBG (last 3)  Recent Labs    01/15/22 1650 01/15/22 2057 01/16/22 0602  GLUCAP 113* 145* 121*   2/21- CBGs controlled- con't regimen- educated on insulin and reminded how to take care of self DM- wise 9.  Hypertension.  Hydralazine 25 mg every 8 hours, Coreg 25 mg twice daily, Norvasc 10 mg daily, Avapro 75 mg daily.  Monitor with increased mobility   Vitals:   01/16/22 0350 01/16/22 0805  BP: (!) 119/52 (!) 116/57  Pulse: (!) 59 60  Resp: 20   Temp: 97.7 F (36.5 C)   SpO2: 90%   2/21- BP controlled- con't regimen 10.  Hyperlipidemia.  Lipitor 11.  CAD with stenting.  Continue aspirin and Plavix 12.  History of tobacco use.  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  13. L knee contracture- has BKA prosthesis- don't see a way to fix-  14. Scrotal swelling- since iliofemoral endarterectomy- will elevate.  15. New O2 requirement- will see if can wean off-   2/16- off O2  2/20- back on O2 as below #18 16. RUE PICC- will see if able to  get out?  2/16- will order for removal 17. Loose stools  2/16- will make colace prn and change senna to 1 tab BID- and have him hold bowel meds today  2/17- doing better- con't regimen 18.  Hypoxia due to fluid overload , ECHO `2wks ago looked nl although some technical limitations , responding to Lasix went from 6L to 4.5L O2, will increase IV Lasix to BID   2/20- willa dd flutter valve due to low lung volumes and ask nursing to give albuterol prn nebs.  2/21- Pt down to 3L O2- will con't flutter valve 19. AKI  2/20- Cr risen from 1.54 to 1.8- due to increase in Lasix. However O2 requirement is less- will monitor closely.  20. R TMA  2/21- will ask Vascular when can change dressing-    I spent a total of  39  minutes on total care today- >50% coordination of care- due to team conference and education on O2 levels- also d/w PA about calling vascular.     LOS: 7 days A FACE TO FACE EVALUATION WAS PERFORMED  Robie Mcniel 01/16/2022, 9:23 AM

## 2022-01-16 NOTE — Patient Care Conference (Signed)
Inpatient RehabilitationTeam Conference and Plan of Care Update Date: 01/16/2022   Time: 11:37 AM    Patient Name: Antonio [name].      Medical Record Number: [id number]  Date of Birth: July 13, 1956 Sex: Male         Room/Bed: 4W18C/4W18C-01 Payor Info: Payor: [employer] [place] / Plan: [place] MEDICAID [place] / Product Type: *No Product type* /    Admit Date/Time:  01/09/2022  3:43 PM  Primary Diagnosis:  S/P transmetatarsal amputation of foot, right [name])  Hospital Problems: Principal Problem:   S/P transmetatarsal amputation of foot, right [facility])    Expected Discharge Date: Expected Discharge Date: 01/23/22  Team Members Present: Physician leading conference: Dr. Courtney Heys Social Worker Present: Ovidio Kin, LCSW Nurse Present: Dorthula Nettles, RN PT Present: Ailene Rud, PT OT Present: Willeen Cass, OT PPS Coordinator present : Gunnar Fusi, SLP     Current Status/Progress Goal Weekly Team Focus  Bowel/Bladder   Continent B/B LBM 2/20  Remain continent  Offer toileting assist as needed   Swallow/Nutrition/ Hydration             ADL's   CGA squat pivot transfers, refuses to transfer to [facility] or toilet, Min A UB dressing, supervision bathing EOB using lean technique, Max A LB dressing. Pt very self limiting and resistant to therapeutic instruction, deficits with safety awareness (i.e. pt adamantly refuses to wear his offloading shoe during transfers)  Mod I- may need to downgrade due to self limiting behaviors/deficits with safety awareness  adaptive self care skills, functional transfers, d/c planning, dynamic balance   Mobility   CGA squat pivots, short distance w/c  mod I, min A gait  transfers, endurance.   Communication             Safety/Cognition/ Behavioral Observations            Pain   8 out of 10 constant- Patient requesting PRN dilaudid q4h consistently  <3 out of 10  review q shift and treat PRN   Skin   Rt groin  site incision pink/skin glue/no drainage. Rt stump incision staples/sutures. Area pink/red with some darkened area. scattered brusing  No s/s of infection  Assess skin q shift and PRN. Focus on education for dressing changes.     Discharge Planning:  Lives in basement apartment of Mom's home, she can not assist. Pt needs to be mod/i to be able to care for himself at discharge. Other family members checking on him   Team Discussion: Reports Dilaudid is only thing helping with pain. On 4L O2, now going to 3L. Continue to push flutter valve. Blood clots when blowing nose. Continent B/B, severe pain. Waiting on surgery to give dressing change orders for right foot. CBG's labile. Discharging home alone. Mother here earlier visiting, patient asked her to leave.  Patient on target to meet rehab goals: yes, mod I goals. Self-limiting, can do transfers. Minimal participation.  *See Care Plan and progress notes for long and short-term goals.   Revisions to Treatment Plan:  Adjusting medications, weaning oxygen.   Teaching Needs: Family education, medication/pain management, skin/wound care, transfer/gait training, etc.   Current Barriers to Discharge: Wound care, Lack of/limited family support, Weight bearing restrictions, Behavior, and oxygen use.  Possible Resolutions to Barriers: Family education Follow-up PT/OT Wean oxygen, no insurance coverage, no diagnosis for use     Medical Summary Current Status: R TMA refuses to wear offloading shoe- L BKA- chronic; takes pain  meds around the clock-continent- 3L O2- new  Barriers to Discharge: Behavior;Decreased family/caregiver support;Home enviroment access/layout;Weight bearing restrictions;Wound care;New oxygen;Medical stability;Other (comments)  Barriers to Discharge Comments: self limiting- refusing therapy regularly- lives in mother's basement- cannot get O2 with insurance Possible Resolutions to [employer] Focus: won't participate with  therapy regularly- can transfer-need to wean O2-d/c 2/28   Continued Need for Acute Rehabilitation Level of Care: The patient requires daily medical management by a physician with specialized training in physical medicine and rehabilitation for the following reasons: Direction of a multidisciplinary physical rehabilitation program to maximize functional independence : Yes Medical management of patient stability for increased activity during participation in an intensive rehabilitation regime.: Yes Analysis of laboratory values and/or radiology reports with any subsequent need for medication adjustment and/or medical intervention. : Yes   I attest that I was present, lead the team conference, and concur with the assessment and plan of the team.   Cristi Loron 01/16/2022, 3:09 PM

## 2022-01-16 NOTE — Progress Notes (Signed)
Physical Therapy Session Note  Patient Details  Name: Antonio [name]. MRN: [id number] Date of Birth: [birth year]  Today's Date: 01/16/2022 PT Individual Time: 1436-1530 PT Individual Time Calculation (min): 54 min   Short Term Goals: Week 1:  PT Short Term Goal 1 (Week 1): Pt will perform transfers with supervision PT Short Term Goal 2 (Week 1): Pt will stand with RW PT Short Term Goal 3 (Week 1): Pt will initiate pre gait activities  Skilled Therapeutic Interventions/Progress Updates:    pt received in bed and agreeable to therapy, but refusing OOB mobility d/t not wearing underwear. Declined donning underwear to get out of bed d/t scrotal swelling. Maintained O2=88% on 3.5 L O2. Reports 6/10 pain with activity, premedicated. Rest and positioning provided as needed.  Pt performed the following exercises to promote global strength and endurance:  RTB: Shoulder abduction 3 x 10 RLE LAQ 2 x 10  Hip abduction 3 x 15  Pt then donned L prosthetic to attempt standing. Donned darco shoe tot A. Sit to stand from elevated bed with min A x 4. Pt required extended rest break to maintain O2 sats. Pt remained seated EOB to await OT session and was left with all needs in reach and alarm active.    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, pt able Restrictions Weight Bearing Restrictions: Yes RLE Weight Bearing: Partial weight bearing RLE Partial Weight Bearing Percentage or Pounds: heel Other Position/Activity Restrictions: Per vascular sx note General:       Therapy/Group: Individual Therapy  Mickel Fuchs 01/16/2022, 2:50 PM

## 2022-01-16 NOTE — Progress Notes (Signed)
Occupational Therapy Note  Patient Details  Name: Antonio [name]. MRN: [id number] Date of Birth: May 14, 1956  Today's Date: 01/16/2022 OT Missed Time: 75 Minutes Missed Time Reason: Pain  Pt sitting EOB with mother present sitting in transport chair.  Therapist provided bariatric bedside commode per PT previous request and asked pt if he would be agreeable to blocked practice transfer to/from [facility] to ensure good fit.  Pt closing his eyes and appearing with scowl on face.  C/o 8.5/10 pain but would not specify where or describe type of pain to this therapist when prompted.  Pt requesting nurse to bring pain medication.  Made nurse aware.  Pts mother reporting he doesn't want her their and pt angrily responding to his mother stating "get her out!".  Therapist transported pts mother to nurses station where her aide observed picking her up.  Pt refusing all further therapy this session.    Caryl Asp Plummer Matich 01/16/2022, 10:52 AM

## 2022-01-17 LAB — CBC WITH DIFFERENTIAL/PLATELET
Abs Immature Granulocytes: 0.02 10*3/uL (ref 0.00–0.07)
Basophils Absolute: 0 10*3/uL (ref 0.0–0.1)
Basophils Relative: 0 %
Eosinophils Absolute: 0.1 10*3/uL (ref 0.0–0.5)
Eosinophils Relative: 2 %
HCT: 31.6 % — ABNORMAL LOW (ref 39.0–52.0)
Hemoglobin: 10 g/dL — ABNORMAL LOW (ref 13.0–17.0)
Immature Granulocytes: 0 %
Lymphocytes Relative: 17 %
Lymphs Abs: 1 10*3/uL (ref 0.7–4.0)
MCH: 28.2 pg (ref 26.0–34.0)
MCHC: 31.6 g/dL (ref 30.0–36.0)
MCV: 89 fL (ref 80.0–100.0)
Monocytes Absolute: 0.6 10*3/uL (ref 0.1–1.0)
Monocytes Relative: 11 %
Neutro Abs: 4.2 10*3/uL (ref 1.7–7.7)
Neutrophils Relative %: 70 %
Platelets: 265 10*3/uL (ref 150–400)
RBC: 3.55 MIL/uL — ABNORMAL LOW (ref 4.22–5.81)
RDW: 12.5 % (ref 11.5–15.5)
WBC: 6 10*3/uL (ref 4.0–10.5)
nRBC: 0 % (ref 0.0–0.2)

## 2022-01-17 LAB — BASIC METABOLIC PANEL
Anion gap: 11 (ref 5–15)
BUN: 41 mg/dL — ABNORMAL HIGH (ref 8–23)
CO2: 31 mmol/L (ref 22–32)
Calcium: 8.4 mg/dL — ABNORMAL LOW (ref 8.9–10.3)
Chloride: 98 mmol/L (ref 98–111)
Creatinine, Ser: 1.26 mg/dL — ABNORMAL HIGH (ref 0.61–1.24)
GFR, Estimated: >60 mL/min (ref >60)
Glucose, Bld: 172 mg/dL — ABNORMAL HIGH (ref 70–99)
Potassium: 3.9 mmol/L (ref 3.5–5.1)
Sodium: 140 mmol/L (ref 135–145)

## 2022-01-17 LAB — GLUCOSE, CAPILLARY
Glucose-Capillary: 157 mg/dL — ABNORMAL HIGH (ref 70–99)
Glucose-Capillary: 113 mg/dL — ABNORMAL HIGH (ref 70–99)
Glucose-Capillary: 138 mg/dL — ABNORMAL HIGH (ref 70–99)
Glucose-Capillary: 177 mg/dL — ABNORMAL HIGH (ref 70–99)

## 2022-01-17 MED ORDER — HYDROMORPHONE HCL 2 MG PO TABS
2.0000 mg | ORAL_TABLET | ORAL | Status: DC | PRN
  Administered 2022-01-17 – 2022-01-23 (×31): 2 mg via ORAL
  Filled 2022-01-17 (×31): qty 1

## 2022-01-17 NOTE — Progress Notes (Signed)
Occupational Therapy Session Note  Patient Details  Name: Antonio [name]. MRN: [id number] Date of Birth: Apr 06, 1956  Today's Date: 01/17/2022 OT Individual Time: [id number] OT Individual Time Calculation (min): 17 min  (Make up time)   Short Term Goals: Week 1:  OT Short Term Goal 1 (Week 1): Pt will complete LB dressing via lateral leans with supervision OT Short Term Goal 2 (Week 1): Pt will complete sit > stand in prep for ADL with Max A OT Short Term Goal 3 (Week 1): Pt will complete pericare with CGA for increased independence with toileting self-care  Skilled Therapeutic Interventions/Progress Updates:  Pt greeted seated EOB  agreeable to OT intervention to make up missed minutes. Session focus on breathing exercises to promote cardiopulmonary output to improve endurance for ADL participation. Pt completed x10 reps on flutter valve, additionally issued pt breathing exercise HEP including below therex: X10 pursed lip breathing X10 diaphragmatic breathing X10 pursed lip breathing with trunk flexion extension  Issued pt HEP with recommendation of completed therex 3x daily with 10 reps each to increase carryover. Pt on 4L [place] with SpO2 >96% during session. pt left seated EOB with all needs within reach.                     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, pt able Restrictions Weight Bearing Restrictions: Yes RLE Weight Bearing: Partial weight bearing RLE Partial Weight Bearing Percentage or Pounds: heel Other Position/Activity Restrictions: Per vascular sx note  Pain: no pain reported during session    Therapy/Group: Individual Therapy  Corinne Ports [facility] 01/17/2022, 10:59 AM

## 2022-01-17 NOTE — Progress Notes (Addendum)
At 0130 last night patient was alert/oriented to self and place, disoriented to time and situation. Did not want to lay in bed and wanted to sit in wheel chair. Placed in wheel chair with chair alarm and call bell within reach. Patient refused to lay back down in bed, very restless and stated "I want to go home now". Will continue with plan of care.

## 2022-01-17 NOTE — Progress Notes (Signed)
PROGRESS NOTE   Subjective/Complaints:  Pt reports L TMA dressing was changed overnight. Is using darco shoe more per notes and pt.  Said woke up overnight- didn't know where he was initially.  York Spaniel was in w/c this AM for prolonged period. But saw him at 8am, so not sure when in w/c? Also, last night, they attempted to wean O2 to 1.5L- didn't work- felt very SOB.  Is back on 3L- also having difficulties with his mother.      ROS:  Pt denies SOB on O2, abd pain, CP, N/V/C/D, and vision changes     Objective:   No results found. Recent Labs    01/15/22 0512 01/17/22 0542  WBC 8.6 6.0  HGB 8.9* 10.0*  HCT 28.3* 31.6*  PLT 209 265   Recent Labs    01/15/22 0512 01/17/22 0542  NA 135 140  K 4.2 3.9  CL 99 98  CO2 28 31  GLUCOSE 167* 172*  BUN 45* 41*  CREATININE 1.84* 1.26*  CALCIUM 8.0* 8.4*    Intake/Output Summary (Last 24 hours) at 01/17/2022 1824 Last data filed at 01/17/2022 1300 Gross per 24 hour  Intake 832 ml  Output 2140 ml  Net -1308 ml        Physical Exam: Vital Signs Blood pressure (!) 124/55, pulse 65, temperature (!) 97.5 F (36.4 C), temperature source Oral, resp. rate 17, height 6' (1.829 m), weight 93 kg, SpO2 95 %.       General: awake, alert, appropriate, sitting EOB; wearing 3L O2 by [place]; NAD HENT: conjugate gaze; oropharynx moist- hoarse vice secondary to previous trach CV: regular rate; no JVD Pulmonary: decreased at bases B/L- no W/R/R GI: soft, NT, ND, (+)BS Psychiatric: appropriate Neurological: Ox3, but sounds like might have been confused overnight-  Genitourinary:    Comments: Scrotum moderately swollen- pt says was size of grapefruit- is now large navel orange- no change Musculoskeletal: TTP across L shoulder- c/w RTC injjry- posterior pain    Comments: L BKA healed well- no redness R TMA- in ACE wrap Lacking 30 degrees of L knee extension- contracture-  painful to try and extend further UE 5/5  B/L  RLE- HF/KE/KF 5/5; NT distally LLE- HF 5/5- cannot test knee due to contracture  Skin:    Comments: Right TMA dressing in place.  Appropriately tender- covered with ACE wrap Significant Clubbing of fingernails B/L  PICC in R upper arm- signs of blood outside PICC/surrounding it  Neurological:     Comments: Patient is alert.  No acute distress and follows commands.  Oriented x3. Intact to light touch I UE however decreased below knees B/L   Assessment/Plan: 1. Functional deficits which require 3+ hours per day of interdisciplinary therapy in a comprehensive inpatient rehab setting. Physiatrist is providing close team supervision and 24 hour management of active medical problems listed below. Physiatrist and rehab team continue to assess barriers to discharge/monitor patient progress toward functional and medical goals  Care Tool:  Bathing    Body parts bathed by patient: Right arm, Left arm, Chest, Abdomen, Front perineal area, Buttocks, Right upper leg, Left upper leg, Face   Body parts bathed by  helper: Right lower leg Body parts n/a: Left lower leg, Right lower leg   Bathing assist Assist Level: Supervision/Verbal cueing     Upper Body Dressing/Undressing Upper body dressing   What is the patient wearing?: Pull over shirt    Upper body assist Assist Level: Set up assist    Lower Body Dressing/Undressing Lower body dressing      What is the patient wearing?: Pants     Lower body assist Assist for lower body dressing: Moderate Assistance - Patient 50 - 74%     Toileting Toileting    Toileting assist Assist for toileting: Independent with assistive device Assistive Device Comment: urinal   Transfers Chair/bed transfer  Transfers assist  Chair/bed transfer activity did not occur: Safety/medical concerns  Chair/bed transfer assist level: Moderate Assistance - Patient 50 - 74%      Locomotion Ambulation   Ambulation assist   Ambulation activity did not occur: Safety/medical concerns          Walk 10 feet activity   Assist  Walk 10 feet activity did not occur: Safety/medical concerns        Walk 50 feet activity   Assist Walk 50 feet with 2 turns activity did not occur: Safety/medical concerns         Walk 150 feet activity   Assist Walk 150 feet activity did not occur: Safety/medical concerns         Walk 10 feet on uneven surface  activity   Assist Walk 10 feet on uneven surfaces activity did not occur: Safety/medical concerns         Wheelchair     Assist Is the patient using a wheelchair?: Yes Type of Wheelchair: Manual    Wheelchair assist level: Supervision/Verbal cueing Max wheelchair distance: 100 ft    Wheelchair 50 feet with 2 turns activity    Assist        Assist Level: Supervision/Verbal cueing   Wheelchair 150 feet activity     Assist      Assist Level: Moderate Assistance - Patient 50 - 74%   Blood pressure (!) 124/55, pulse 65, temperature (!) 97.5 F (36.4 C), temperature source Oral, resp. rate 17, height 6' (1.829 m), weight 93 kg, SpO2 95 %.  Medical Problem List and Plan: 1. 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  Note specifies right TMA dressing to remain in place until 2/13 as well as history of left BKA 2019             -patient may  shower if cover R TMA/RLE             -ELOS/Goals: 7-10 days mod I-   2/22- d/c set for 2/28- trying to arrange for O2.   Con't CIR_ PT and OT 2.  Antithrombotics: -DVT/anticoagulation:  Pharmaceutical: Lovenox             -antiplatelet therapy: Aspirin 81 mg daily and Plavix 75 mg daily 3. Pain Management: Dilaudid 4 mg every 4 hours as needed  2/15- will order 1x/day- daily scheduled pain meds at 7:30am so can participate in therapy.   2/16- pt  wants scheduled pain meds d/c;d   2/17- will add Voltaren gel 2G QID for L shoulder pain   2/21- pt taking dilaudid around the clock- per nursing-   2/22- will need to f/u with surgeon to get pain meds at d/c- since they handle TMA- I don't need to see for  rehab needs 4. Mood: Provide emotional support             -antipsychotic agents: N/A 5. Neuropsych: This patient is capable of making decisions on his own behalf. 6. Skin/Wound Care: Routine skin checks 7. Fluids/Electrolytes/Nutrition: Routine in and outs with follow-up chemistries 8.  Diabetes mellitus.  Hemoglobin A1c 11.3.  Semglee 24 units daily.  Check blood sugars before meals and at bedtime  CBG (last 3)  Recent Labs    01/17/22 0622 01/17/22 1240 01/17/22 1654  GLUCAP 157* 113* 138*   2/21- CBGs controlled- con't regimen- educated on insulin and reminded how to take care of self DM- wise  2/22- CBGs controlled- con't regimen 9.  Hypertension.  Hydralazine 25 mg every 8 hours, Coreg 25 mg twice daily, Norvasc 10 mg daily, Avapro 75 mg daily.  Monitor with increased mobility   Vitals:   01/17/22 1239 01/17/22 1435  BP:  (!) 124/55  Pulse:  65  Resp: 18 17  Temp:  (!) 97.5 F (36.4 C)  SpO2:  95%  2/21- BP controlled- con't regimen 10.  Hyperlipidemia.  Lipitor 11.  CAD with stenting.  Continue aspirin and Plavix 12.  History of tobacco use.  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  13. L knee contracture- has BKA prosthesis- don't see a way to fix-  14. Scrotal swelling- since iliofemoral endarterectomy- will elevate.  15. New O2 requirement- will see if can wean off- has COPD  2/16- off O2  2/20- back on O2 as below #18  2/22- cannot wean below 2-3L at a time- CXR looks OK- likely COPD-  16. RUE PICC- will see if able to get out?  2/16- will order for removal 17. Loose stools  2/16- will make colace prn and change senna to 1 tab BID- and have him hold bowel meds today  2/17- doing better- con't regimen 18.  Hypoxia due to  fluid overload , ECHO `2wks ago looked nl although some technical limitations , responding to Lasix went from 6L to 4.5L O2, will increase IV Lasix to BID   2/20- willa dd flutter valve due to low lung volumes and ask nursing to give albuterol prn nebs.  2/21- Pt down to 3L O2- will con't flutter valve  2/22- as above 19. AKI  2/20- Cr risen from 1.54 to 1.8- due to increase in Lasix. However O2 requirement is less- will monitor closely.   2/22- Cr down to 1.26- con't to monitor 20. R TMA  2/21- will ask Vascular when can change dressing-   2/22- changed yesterday    I spent a total of  38   minutes on total care today- >50% coordination of care- due to  focusing on getting pt to participate and work with therapy and d/w team about getting pain meds via surgeon after d/c.    LOS: 8 days A FACE TO FACE EVALUATION WAS PERFORMED  Antonio [name] 01/17/2022, 6:24 PM

## 2022-01-17 NOTE — Progress Notes (Signed)
Physical Therapy Session Note  Patient Details  Name: Antonio [name]. MRN: [id number] Date of Birth: [birth year]  Today's Date: 01/17/2022 PT Individual Time: 1445-1530 PT Individual Time Calculation (min): 45 min   Short Term Goals: Week 1:  PT Short Term Goal 1 (Week 1): Pt will perform transfers with supervision PT Short Term Goal 1 - Progress (Week 1): Not met PT Short Term Goal 2 (Week 1): Pt will stand with RW PT Short Term Goal 2 - Progress (Week 1): Met PT Short Term Goal 3 (Week 1): Pt will initiate pre gait activities PT Short Term Goal 3 - Progress (Week 1): Met  Skilled Therapeutic Interventions/Progress Updates: Pt presented in w/c initially having headed conversation on phone with pt waving therapist off. PTA returned ~15 min later with pt agreeable to participate. Pt frustrated due to pain meds being reduced. PTA participated in active listening and then suggested to discuss with MD in am. Pt with Darco shoe on but requesting to not perform any additional standing 2/2 increased shoulder pain. Pt requesting to apply Voltaren gel on shoulders with LPN aware. Pt then participated in [place] activities with theraband for general conditioning and increased tolerance to RW management. Pt performed bicep curls, shoulder horizontal abduction, PNF diagonals, rows, and triceps extension with yellow theraband but to fatgue (approx 15-20 reps ea). Pt also used IS avg 511m however was able to maintain bobble in mid range during inhalation. Pt remained in w/c at end of session and left with call bell within reach and needs met.      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, pt able Restrictions Weight Bearing Restrictions: Yes RLE Weight Bearing: Partial weight bearing RLE Partial Weight Bearing Percentage or Pounds: heel Other Position/Activity Restrictions: Per vascular sx note General: PT  Amount of Missed Time (min): 15 Minutes PT Missed Treatment Reason: Other (Comment) (pt phone call) Vital Signs: Therapy Vitals Temp: (!) 97.5 F (36.4 C) Temp Source: Oral Pulse Rate: 65 Resp: 17 BP: (!) 124/55 Patient Position (if appropriate): Sitting Oxygen Therapy SpO2: 95 % O2 Device: Room Air Pain: Pain Assessment Pain Scale: 0-10 Pain Score: 8  Pain Type: Acute pain Pain Location: Foot Pain Orientation: Right Pain Descriptors / Indicators: Aching;Shooting Pain Frequency: Constant Pain Onset: On-going Patients Stated Pain Goal: 1 Pain Intervention(s): Medication (See eMAR);RN made aware;MD notified (Comment) Multiple Pain Sites: No Mobility:   Locomotion :    Trunk/Postural Assessment :    Balance:   Exercises:   Other Treatments:      Therapy/Group: Individual Therapy  Teryn Boerema 01/17/2022, 4:18 PM

## 2022-01-17 NOTE — Progress Notes (Signed)
Physical Therapy Weekly Progress Note  Patient Details  Name: Antonio [name]. MRN: [id number] Date of Birth: 04/25/1956  Beginning of progress report period: January 10, 2022 End of progress report period: January 17, 2022  Today's Date: 01/17/2022 PT Individual Time: 1105-1200, 1400-1430 PT Individual Time Calculation (min): 55 min, 30 min   Patient has met 2 of 3 short term goals.  CGA squat pivot transfers, mod I bed mobility   Patient continues to demonstrate the following deficits muscle weakness and muscle joint tightness, decreased cardiorespiratoy endurance and decreased oxygen support, and decreased standing balance, decreased postural control, decreased balance strategies, and difficulty maintaining precautions and therefore will continue to benefit from skilled PT intervention to increase functional independence with mobility.  Patient progressing toward long term goals..  Continue plan of care.  PT Short Term Goals Week 1:  PT Short Term Goal 1 (Week 1): Pt will perform transfers with supervision PT Short Term Goal 1 - Progress (Week 1): Not met PT Short Term Goal 2 (Week 1): Pt will stand with RW PT Short Term Goal 2 - Progress (Week 1): Met PT Short Term Goal 3 (Week 1): Pt will initiate pre gait activities PT Short Term Goal 3 - Progress (Week 1): Met Week 2:  PT Short Term Goal 1 (Week 2): =LTGs d/t ELOS  Skilled Therapeutic Interventions/Progress Updates:   Session 1: pt received in bed and agreeable to therapy. Pt reports pain controlled on medication. Donned shorts with min A to thread and pull over hips. Donned prosthetic independently and darco shoe tot A. Pt then requested privacy to use urinal, therapist retrieved O2 caddy. CGA squat pivot to w/c, pt demoes impulsive behavior but was able to complete dafely. Pt propelled w/c with BUE x 150 ft to day room for endurance. Pt requires frequent and extended rest breaks to maintain O2 levels and d/t fatigue. Pt  performed Stand pivot transfer with mod A <>mat table with RW. Maintained flexed hip and knee posture throughout. Pt propelled back to room and remained in chair, was left with all needs in reach and alarm active.   Session 2: Pt seated in w/c on arrival and agreeable to therapy. Pt reports pain "everywhere" but could not specify, premedicated. Rest and positioning provided as needed. Pt also stated he had seen "a mother and young girl" who fell asleep in his chair. Consulted RN, who was aware and contacting MD. Pt required extensive encouragement to participate, but eventually agreeable. Pt performed Sit to stand x 4 with CGA and RW. Pt required extended rest breaks d/t fatigue/personality. Pt remained in w/c to await following PT session and was left with all needs in reach and alarm active.   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, pt able Restrictions Weight Bearing Restrictions: Yes RLE Weight Bearing: Partial weight bearing RLE Partial Weight Bearing Percentage or Pounds: heel Other Position/Activity Restrictions: Per vascular sx note General:     Therapy/Group: Individual Therapy  Mickel Fuchs 01/17/2022, 12:18 PM

## 2022-01-17 NOTE — Progress Notes (Signed)
Patient ID: Antonio [name]., male   DOB: 05/02/1956, 66 y.o.   MRN: [id number]  Met with pt who reports he is going home to his Mom's home at discharge and his daughter will be there to assist him. Discussed having her come in and go through education and he declined this and feels he can self direct her with his care needs. He gave me the person he wants called to set up O2 delivery-[employer] [phone]. Will place order on Friday for this he reports he has everything else.

## 2022-01-17 NOTE — Progress Notes (Signed)
Occupational Therapy Session Note  Patient Details  Name: Antonio [name]. MRN: [id number] Date of Birth: 11/12/1956  Today's Date: 01/17/2022 OT Individual Time: 0700-0745 OT Individual Time Calculation (min): 45 min  and Today's Date: 01/17/2022 OT Missed Time: 15 Minutes Missed Time Reason: Patient fatigue   Short Term Goals: Week 1:  OT Short Term Goal 1 (Week 1): Pt will complete LB dressing via lateral leans with supervision OT Short Term Goal 2 (Week 1): Pt will complete sit > stand in prep for ADL with Max A OT Short Term Goal 3 (Week 1): Pt will complete pericare with CGA for increased independence with toileting self-care  Skilled Therapeutic Interventions/Progress Updates:    Pt seated in w/c upon arrival, eating breakfast. Pt recounted previous night and his concerns. Pt stated he was not able to rest and had difficulty breathing during the night. Pt states he was disoriented during the night also. Therapeutic listening employed along with emotional support. Pt declined any exercises but agreeable to transferring to EOB. Pt donned LLE prosthesis independently. Pt declined donning Darco on Rt foot. Squat pivot transfer to EOB with CGA. Pt remained seated EOB with bed alarm activated. All needs within reach. Pt missed 15 mins skilled OT services.   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, pt able Restrictions Weight Bearing Restrictions: Yes RLE Weight Bearing: Partial weight bearing RLE Partial Weight Bearing Percentage or Pounds: heel Other Position/Activity Restrictions: Per vascular sx note General: General OT Amount of Missed Time: 15 Minutes Pain: Pt c/o 8/10 Rt "groin pain"; meds requested from RN   Therapy/Group: Individual Therapy  Leroy Libman 01/17/2022, 7:54 AM

## 2022-01-18 LAB — GLUCOSE, CAPILLARY
Glucose-Capillary: 196 mg/dL — ABNORMAL HIGH (ref 70–99)
Glucose-Capillary: 150 mg/dL — ABNORMAL HIGH (ref 70–99)
Glucose-Capillary: 189 mg/dL — ABNORMAL HIGH (ref 70–99)
Glucose-Capillary: 112 mg/dL — ABNORMAL HIGH (ref 70–99)

## 2022-01-18 NOTE — Progress Notes (Signed)
Occupational Therapy Session Note  Patient Details  Name: Antonio [name]. MRN: [id number] Date of Birth: 10-May-1956  Today's Date: 01/18/2022 OT Individual Time: 7903-8333 OT Individual Time Calculation (min): 71 min    Short Term Goals: Week 1:  OT Short Term Goal 1 (Week 1): Pt will complete LB dressing via lateral leans with supervision OT Short Term Goal 2 (Week 1): Pt will complete sit > stand in prep for ADL with Max A OT Short Term Goal 3 (Week 1): Pt will complete pericare with CGA for increased independence with toileting self-care  Skilled Therapeutic Interventions/Progress Updates:     Pt received in w/c with 8 out of 10 pain in R amputation. RN already provided medication for pain relief.  ADL: Pt completes ADL at overall set up for [employer] & grooming seated at sink and supervision for LB dressing Level seated EOB using lateral leans. Skilled interventions include: continued education on use of DARCO shoe however pt continues to refuse using the shoe. When scooting to EOB pt puts end of transmet amputation where  incision is and uses that to push into and scoot to EOC (OT profusely educates that this could cause his incision to dehiss but pt refuses education and brushes it off "it wont hurt it one time." Pt completes MIN A squat pivot transfer to EOB with no shoe and completes lateral leans with set up. OT educates that this can also be done on [facility] with bucket out for improved access to peri area and buttocks. Pt verbalized understanding. Education on scrotal sling or towel for elevation to improve swelling but pt declines despite having many complaints. Education on practicing toilet and shower transfers at home prior to attempting at home since pt has not done this yet. OT educates on shifting the TTB in his shower at home out over the ledge so pt does not need to step over edge of shower but pt states, "no it is fine the way it is, I have it mastered" despite not attempting it  yet since this admission/amputation.  Therapeutic exercise Pt completes 3x1 min beach ball volley in supported sitting position with 2 # dowel rod for dynamic balance, postural control, BUE strengthening and endurance required for BADLs and functional transfers.   Prolonged rest breaks required for all activities requiring more than reasonable amount of time to compmlete task despite encouragement and education that O2 is remaining >94% with activity on 3L [place]  Pt left at end of session in w/c with exit alarm on, call light in reach and all needs met   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, pt able Restrictions Weight Bearing Restrictions: Yes RLE Weight Bearing: Partial weight bearing RLE Partial Weight Bearing Percentage or Pounds: heel Other Position/Activity Restrictions: Per vascular sx note General:    Therapy/Group: Individual Therapy  Tonny Branch 01/18/2022, 7:11 AM

## 2022-01-18 NOTE — Progress Notes (Signed)
PROGRESS NOTE   Subjective/Complaints:  Pt reports using Darco shoe, however per OT notes, continues to refuse to refuse ot use it.  Said waiting for OT- wants shower, but  says doesn't have time although set up for 75 minutes.  LBM this AM/overnight-  Sleeps better in w/c per pt, with legs dangling down- declines to hear how this can cause more swelling.      ROS:  Pt denies worsening SOB, abd pain, CP, N/V/C/D, and vision changes      Objective:   No results found. Recent Labs    01/17/22 0542  WBC 6.0  HGB 10.0*  HCT 31.6*  PLT 265   Recent Labs    01/17/22 0542  NA 140  K 3.9  CL 98  CO2 31  GLUCOSE 172*  BUN 41*  CREATININE 1.26*  CALCIUM 8.4*    Intake/Output Summary (Last 24 hours) at 01/18/2022 0958 Last data filed at 01/18/2022 0900 Gross per 24 hour  Intake 616 ml  Output 2790 ml  Net -2174 ml        Physical Exam: Vital Signs Blood pressure (!) 155/67, pulse 61, temperature 98 F (36.7 C), temperature source Oral, resp. rate 19, height 6' (1.829 m), weight 93 kg, SpO2 93 %.        General: awake, alert, appropriate, sitting up in w/c- 3L O2 by [place]; NAD HENT: conjugate gaze; oropharynx moist- hoarse gravely voice from previous trach CV: regular rate; no JVD Pulmonary: decreased throughout, but no W/R/R GI: soft, NT, ND, (+)BS Psychiatric: appropriate Neurological: Ox3 Genitourinary:    Comments: Scrotum moderately swollen- pt says was size of grapefruit- is now large navel orange- no change- not elevating per nursing and therapy notes Musculoskeletal: TTP across L shoulder- c/w RTC injjry- posterior pain    Comments: L BKA healed well- no redness R TMA- in ACE wrap Lacking 30 degrees of L knee extension- contracture- painful to try and extend further UE 5/5  B/L  RLE- HF/KE/KF 5/5; NT distally LLE- HF 5/5- cannot test knee due to contracture  Skin:    Comments: Right TMA  dressing in place.  Appropriately tender- covered with ACE wrap Significant Clubbing of fingernails B/L  PICC in R upper arm- signs of blood outside PICC/surrounding it  Neurological:     Comments: Patient is alert.  No acute distress and follows commands.  Oriented x3. Intact to light touch I UE however decreased below knees B/L   Assessment/Plan: 1. Functional deficits which require 3+ hours per day of interdisciplinary therapy in a comprehensive inpatient rehab setting. Physiatrist is providing close team supervision and 24 hour management of active medical problems listed below. Physiatrist and rehab team continue to assess barriers to discharge/monitor patient progress toward functional and medical goals  Care Tool:  Bathing    Body parts bathed by patient: Right arm, Left arm, Chest, Abdomen, Front perineal area, Buttocks, Right upper leg, Left upper leg, Face   Body parts bathed by helper: Right lower leg Body parts n/a: Left lower leg, Right lower leg   Bathing assist Assist Level: Supervision/Verbal cueing     Upper Body Dressing/Undressing Upper body dressing  What is the patient wearing?: Pull over shirt    Upper body assist Assist Level: Set up assist    Lower Body Dressing/Undressing Lower body dressing      What is the patient wearing?: Pants     Lower body assist Assist for lower body dressing: Moderate Assistance - Patient 50 - 74%     Toileting Toileting    Toileting assist Assist for toileting: Independent with assistive device Assistive Device Comment: urinal   Transfers Chair/bed transfer  Transfers assist  Chair/bed transfer activity did not occur: Safety/medical concerns  Chair/bed transfer assist level: Moderate Assistance - Patient 50 - 74%     Locomotion Ambulation   Ambulation assist   Ambulation activity did not occur: Safety/medical concerns          Walk 10 feet activity   Assist  Walk 10 feet activity did not occur:  Safety/medical concerns        Walk 50 feet activity   Assist Walk 50 feet with 2 turns activity did not occur: Safety/medical concerns         Walk 150 feet activity   Assist Walk 150 feet activity did not occur: Safety/medical concerns         Walk 10 feet on uneven surface  activity   Assist Walk 10 feet on uneven surfaces activity did not occur: Safety/medical concerns         Wheelchair     Assist Is the patient using a wheelchair?: Yes Type of Wheelchair: Manual    Wheelchair assist level: Supervision/Verbal cueing Max wheelchair distance: 100 ft    Wheelchair 50 feet with 2 turns activity    Assist        Assist Level: Supervision/Verbal cueing   Wheelchair 150 feet activity     Assist      Assist Level: Moderate Assistance - Patient 50 - 74%   Blood pressure (!) 155/67, pulse 61, temperature 98 F (36.7 C), temperature source Oral, resp. rate 19, height 6' (1.829 m), weight 93 kg, SpO2 93 %.  Medical Problem List and Plan: 1. 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  Note specifies right TMA dressing to remain in place until 2/13 as well as history of left BKA 2019             -patient may  shower if cover R TMA/RLE             -ELOS/Goals: 7-10 days mod I-   2/23- d/c 2/28  Con't CIR- pt continues to be self limiting and refuse proven techniques and darco shoe- wants to do it his way.  2.  Antithrombotics: -DVT/anticoagulation:  Pharmaceutical: Lovenox             -antiplatelet therapy: Aspirin 81 mg daily and Plavix 75 mg daily 3. Pain Management: Dilaudid 4 mg every 4 hours as needed  2/15- will order 1x/day- daily scheduled pain meds at 7:30am so can participate in therapy.   2/16- pt wants scheduled pain meds d/c;d   2/17- will add Voltaren gel 2G QID for L shoulder pain   2/21- pt taking dilaudid around the clock- per nursing-    2/22- will need to f/u with surgeon to get pain meds at d/c- since they handle TMA- I don't need to see for rehab needs  2/23- con't pain regimen, however will need to f/u with surgeon for pain meds; not rehab-  4. Mood: Provide emotional support             -  antipsychotic agents: N/A 5. Neuropsych: This patient is capable of making decisions on his own behalf. 6. Skin/Wound Care: Routine skin checks 7. Fluids/Electrolytes/Nutrition: Routine in and outs with follow-up chemistries 8.  Diabetes mellitus.  Hemoglobin A1c 11.3.  Semglee 24 units daily.  Check blood sugars before meals and at bedtime  CBG (last 3)  Recent Labs    01/17/22 1654 01/17/22 2104 01/18/22 0608  GLUCAP 138* 177* 196*   2/21- CBGs controlled- con't regimen- educated on insulin and reminded how to take care of self DM- wise  2/23- CBGs controlled- con't regimen 9.  Hypertension.  Hydralazine 25 mg every 8 hours, Coreg 25 mg twice daily, Norvasc 10 mg daily, Avapro 75 mg daily.  Monitor with increased mobility   Vitals:   01/17/22 1950 01/18/22 0416  BP: (!) 116/50 (!) 155/67  Pulse: (!) 54 61  Resp: 20 19  Temp: 97.6 F (36.4 C) 98 F (36.7 C)  SpO2: 94% 93%  2/21- BP controlled- con't regimen 10.  Hyperlipidemia.  Lipitor 11.  CAD with stenting.  Continue aspirin and Plavix 12.  History of tobacco use.  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  13. L knee contracture- has BKA prosthesis- don't see a way to fix-  14. Scrotal swelling- since iliofemoral endarterectomy- will elevate.  15. New O2 requirement- will see if can wean off- has COPD  2/16- off O2  2/20- back on O2 as below #18  2/23- cannot wean below 2-3L at a time- CXR looks OK- likely COPD based on CXR and Sx's 16. RUE PICC- will see if able to get out?  2/16- will order for removal 17. Loose stools  2/16- will make colace prn and change senna to 1 tab BID- and have him hold bowel meds today  2/17- doing better- con't regimen 18.  Hypoxia due to fluid  overload , ECHO `2wks ago looked nl although some technical limitations , responding to Lasix went from 6L to 4.5L O2, will increase IV Lasix to BID   2/20- willa dd flutter valve due to low lung volumes and ask nursing to give albuterol prn nebs.  2/21- Pt down to 3L O2- will con't flutter valve  2/22- as above 19. AKI  2/20- Cr risen from 1.54 to 1.8- due to increase in Lasix. However O2 requirement is less- will monitor closely.   2/22- Cr down to 1.26- con't to monitor 20. R TMA  2/23- changing dressing daily per notes    I spent a total of  35  minutes on total care today- >50% coordination of care- due to d/w OT and prolonged d/w pt about needs to participate and follow protocols.     LOS: 9 days A FACE TO FACE EVALUATION WAS PERFORMED  Girtie Wiersma 01/18/2022, 9:58 AM

## 2022-01-18 NOTE — Progress Notes (Signed)
Physical Therapy Session Note  Patient Details  Name: Antonio [name]. MRN: [id number] Date of Birth: [birth year]  Today's Date: 01/18/2022 PT Individual Time: 5913-6859 PT Individual Time Calculation (min): 35 min  and Today's Date: 01/18/2022 PT Missed Time: 25 Minutes Missed Time Reason: Patient unwilling to participate  Short Term Goals: Week 1:  PT Short Term Goal 1 (Week 1): Pt will perform transfers with supervision PT Short Term Goal 1 - Progress (Week 1): Not met PT Short Term Goal 2 (Week 1): Pt will stand with RW PT Short Term Goal 2 - Progress (Week 1): Met PT Short Term Goal 3 (Week 1): Pt will initiate pre gait activities PT Short Term Goal 3 - Progress (Week 1): Met Week 2:  PT Short Term Goal 1 (Week 2): =LTGs d/t ELOS  Skilled Therapeutic Interventions/Progress Updates:    Patient received sitting up in wc, agreeable to PT. He reports pain, but did not rate, premedicated. PT providing rest breaks, distractions, and repositioning ot assist with pain management. PT donning Victor for time. He propelled himself in wc to therapy gym with supervision. Patient requiring multiple rest breaks due to fatigue and SOB. Patient requesting to strengthen UE. Seated therex with 4# dowel: chest press, shoulder press, trunk rotation, LAQ. Patient unable to achieve functional knee extension in B LE. Patients family coming to gym. After patient saw his family- he requested to return to his room to visit with family. PT unable to redirect patient. Returning to room in wc, seatbelt alarm on, call light within reach.   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, pt able Restrictions Weight Bearing Restrictions: Yes RLE Weight Bearing: Partial weight bearing RLE Partial Weight Bearing Percentage or Pounds: heel Other Position/Activity Restrictions: Per vascular sx  note    Therapy/Group: Individual Therapy  Karoline Caldwell, PT, DPT, CBIS  01/18/2022, 8:36 AM

## 2022-01-18 NOTE — Progress Notes (Signed)
Occupational Therapy Weekly Progress Note  Patient Details  Name: Antonio [name]. MRN: [id number] Date of Birth: 28-Mar-1956  Beginning of progress report period: January 10, 2022 End of progress report period: January 18, 2022  Today's Date: 01/18/2022 OT Individual Time: 9201-0071 OT Individual Time Calculation (min): 35 min  and Today's Date: 01/18/2022 OT Missed Time: 25 Minutes Missed Time Reason: Patient fatigue   Patient has met 0 of 3 short term goals.  Pt is performing BADLs with Min-Supervision with mostly lateral leans for LB ADL at EOB and on BSC. Pt performing squat pivot transfers with MIN A and needs encouragement to participate and is resistant to education on importance of wearing off loading shoe. Pt continues to be on 3-4L of O2 with activity. Planning to continue POC with upcoming DC home with family support.   Patient continues to demonstrate the following deficits: muscle weakness, decreased cardiorespiratoy endurance, decreased coordination and decreased motor planning, decreased motor planning, and decreased sitting balance, decreased standing balance, decreased postural control, and decreased balance strategies and therefore will continue to benefit from skilled OT intervention to enhance overall performance with BADL and Reduce care partner burden.  Patient progressing toward long term goals..  Continue plan of care.  OT Short Term Goals Week 1:  OT Short Term Goal 1 (Week 1): Pt will complete LB dressing via lateral leans with supervision OT Short Term Goal 1 - Progress (Week 1): Progressing toward goal OT Short Term Goal 2 (Week 1): Pt will complete sit > stand in prep for ADL with Max A OT Short Term Goal 2 - Progress (Week 1): Progressing toward goal OT Short Term Goal 3 (Week 1): Pt will complete pericare with CGA for increased independence with toileting self-care OT Short Term Goal 3 - Progress (Week 1): Progressing toward goal Week 2:  OT Short Term  Goal 1 (Week 2): STG = LTG 2/2 LOS  Skilled Therapeutic Interventions/Progress Updates:    Pt greeted at time of session sitting up in wheelchair from previous session on 3L of O2, switched to portable during session and remained on 3L throughout. Pt states he already completed ADL, declined all exercise and gym related activity, but agreeable to go outside. Pt self propelling approx 50-75 feet with Supervision, stating he was fatigued and requested assist for the remaining distance outside. O2 checked after self propelling at 92%. Once outside, pt removing O2 and educated that he should leave this on, did so with reluctance and O2 at 90%, remained 90% or higher throughout sessoin. Pt ed/discussion outside regarding DC planning, DME needs, assist at home, etc. Pt states he has hover round power chair for home and is not concerned with transfers or self care. Pt requesting to go back inside at this time, declining further activity. Pt missed a total of 25 mins of OT for initially missing 10 mins at beginning of session 2/2 previous pt care and 15 mins at end of session 2/2 fatigue.    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, pt able Restrictions Weight Bearing Restrictions: Yes RLE Weight Bearing: Partial weight bearing RLE Partial Weight Bearing Percentage or Pounds: heel Other Position/Activity Restrictions: Per vascular sx note     Therapy/Group: Individual Therapy  Viona Gilmore 01/18/2022, 12:50 PM

## 2022-01-19 LAB — GLUCOSE, CAPILLARY
Glucose-Capillary: 214 mg/dL — ABNORMAL HIGH (ref 70–99)
Glucose-Capillary: 92 mg/dL (ref 70–99)
Glucose-Capillary: 199 mg/dL — ABNORMAL HIGH (ref 70–99)
Glucose-Capillary: 129 mg/dL — ABNORMAL HIGH (ref 70–99)

## 2022-01-19 NOTE — Progress Notes (Addendum)
Patient ID: Antonio [name]., male   DOB: 1956-08-27, 66 y.o.   MRN: [id number] Pt's O2 has been checked in both PT & OT sessions and both sessions has been at 88% at rest with activity 91% for O2 levels. He therefore does not qualify for home O2. He is aware of this and in agreement. He does have home nebs at home and feels this is helpful. When he is SOB and has a breathing treatment he feels it is very helpful. Continue to work toward discharge for Tuesday 2/28.

## 2022-01-19 NOTE — Progress Notes (Shared)
SATURATION QUALIFICATIONS: (This note is  used to comply with regulatory documentation for home oxygen)  Patient Saturations on Room Air at Rest = %  Patient Saturations on Room Air while Ambulating = %  Patient Saturations on 3 Liters of oxygen while Ambulating = 90%  Please briefly explain why patient needs home oxygen: Pt as COPD Acute pulmonary edema and has had chronic respiratory failure. He requires home O2 to be able to maintain his O2 levels between 88-92%

## 2022-01-19 NOTE — Progress Notes (Signed)
Physical Therapy Session Note ° °Patient Details  °Name: Antonio Watson Borge Jr. °MRN: [id number] °Date of Birth: 04/19/1956 ° °Today's Date: 01/19/2022 °PT Individual Time: 1048-1157 °PT Individual Time Calculation (min): 69 min  ° °Short Term Goals: °Week 1:  PT Short Term Goal 1 (Week 1): Pt will perform transfers with supervision °PT Short Term Goal 1 - Progress (Week 1): Not met °PT Short Term Goal 2 (Week 1): Pt will stand with RW °PT Short Term Goal 2 - Progress (Week 1): Met °PT Short Term Goal 3 (Week 1): Pt will initiate pre gait activities °PT Short Term Goal 3 - Progress (Week 1): Met °Week 2:  PT Short Term Goal 1 (Week 2): =LTGs d/t ELOS ° °Skilled Therapeutic Interventions/Progress Updates:  °  Pt seated in w/c on arrival and agreeable to therapy. Pt reports discomfort from urgent need for BM, which resolved after toileting. Pt requested to use BSC. Modified half stand pivot to BSC with min A, pt using adjacent furniture for support, refusing RW d/t urgency. Pt required extended time for (+) BM. Tot A for hygiene in standing. ° °O2=97% on 3 L O2 at rest. Removed supp O2 to perform testing for home O2. Pt O2 dropped to 91 at rest on room Air for 5 minutes. Pt propelled w/c with BUE x 300 ft with seated rest breaks, longest bout ~60 ft. O2 sats maintained 92-93 % throughout. Pt remained on RA rest of session with sats stable, dropping to 88% at lowest after transfer training ° °Returned to pt room for transfer training. Performed modified stand and squat pivot to bed at various height, discussing pt's home transfer needs, including bed and car transfers.  ° °Pt reported pain from prosthetic pinching below knee. Removed prosthesis and noted large red area from top of prosthesis, behind pt's knee. Removed prosthesis for duration of session and noted redness fading. Pt then demoed squat pivot w/c<>EOB toward both sides, with and without prosthetic, with min A VC for hand placement and head hips relationship.   ° °Pt then reported fatigue and requested to complete session. Remained in w/c and was left with all needs in reach and alarm active.  ° °Therapy Documentation °Precautions:  °Precautions °Precautions: Fall °Precaution Comments: s/p R TMA; prothesis for L prior BKA in room, pt able °Restrictions °Weight Bearing Restrictions: Yes °RLE Weight Bearing: Partial weight bearing °RLE Partial Weight Bearing Percentage or Pounds: heel °Other Position/Activity Restrictions: Per vascular sx note °General: °  ° ° ° °Therapy/Group: Individual Therapy ° ° C  °01/19/2022, 10:56 AM  °

## 2022-01-19 NOTE — Progress Notes (Signed)
PROGRESS NOTE   Subjective/Complaints:  Pt reports no issues except didn't sleep much- Still on 3L O2.   Per therapy notes,still not really participating a lot- self limiting.   ROS:  Pt denies SOB, abd pain, CP, N/V/C/D, and vision changes       Objective:   No results found. Recent Labs    01/17/22 0542  WBC 6.0  HGB 10.0*  HCT 31.6*  PLT 265   Recent Labs    01/17/22 0542  NA 140  K 3.9  CL 98  CO2 31  GLUCOSE 172*  BUN 41*  CREATININE 1.26*  CALCIUM 8.4*    Intake/Output Summary (Last 24 hours) at 01/19/2022 0835 Last data filed at 01/19/2022 0700 Gross per 24 hour  Intake 840 ml  Output 2350 ml  Net -1510 ml        Physical Exam: Vital Signs Blood pressure (!) 158/63, pulse 60, temperature 97.8 F (36.6 C), temperature source Oral, resp. rate 17, height 6' (1.829 m), weight 93 kg, SpO2 94 %.        General: awake, alert, appropriate, sitting EOBNAD HENT: conjugate gaze; oropharynx moist- O2 by [place] 3L; hoarse voice due to previous trach CV: regular rate; no JVD Pulmonary: CTA B/L; no W/R/R- good air movement GI: soft, NT, ND, (+)BS Psychiatric: appropriate Neurological: Ox3  Genitourinary:    Comments: Scrotum moderately swollen- pt says was size of grapefruit- is now large navel orange- no change- not elevating per nursing and therapy notes Musculoskeletal: TTP across L shoulder- c/w RTC injjry- posterior pain    Comments: L BKA healed well- no redness R TMA- in ACE wrap Lacking 30 degrees of L knee extension- contracture- painful to try and extend further UE 5/5  B/L  RLE- HF/KE/KF 5/5; NT distally LLE- HF 5/5- cannot test knee due to contracture  Skin:    Comments: Right TMA dressing in place.  Appropriately tender- covered with ACE wrap Significant Clubbing of fingernails B/L  PICC in R upper arm- signs of blood outside PICC/surrounding it  Neurological:     Comments:  Patient is alert.  No acute distress and follows commands.  Oriented x3. Intact to light touch I UE however decreased below knees B/L   Assessment/Plan: 1. Functional deficits which require 3+ hours per day of interdisciplinary therapy in a comprehensive inpatient rehab setting. Physiatrist is providing close team supervision and 24 hour management of active medical problems listed below. Physiatrist and rehab team continue to assess barriers to discharge/monitor patient progress toward functional and medical goals  Care Tool:  Bathing    Body parts bathed by patient: Right arm, Left arm, Chest, Abdomen, Front perineal area, Buttocks, Right upper leg, Left upper leg, Face   Body parts bathed by helper: Right lower leg Body parts n/a: Left lower leg, Right lower leg   Bathing assist Assist Level: Supervision/Verbal cueing     Upper Body Dressing/Undressing Upper body dressing   What is the patient wearing?: Pull over shirt    Upper body assist Assist Level: Set up assist    Lower Body Dressing/Undressing Lower body dressing      What is the patient wearing?: Pants  Lower body assist Assist for lower body dressing: Moderate Assistance - Patient 50 - 74%     Toileting Toileting    Toileting assist Assist for toileting: Independent with assistive device Assistive Device Comment: urinal   Transfers Chair/bed transfer  Transfers assist  Chair/bed transfer activity did not occur: Safety/medical concerns  Chair/bed transfer assist level: Moderate Assistance - Patient 50 - 74%     Locomotion Ambulation   Ambulation assist   Ambulation activity did not occur: Safety/medical concerns          Walk 10 feet activity   Assist  Walk 10 feet activity did not occur: Safety/medical concerns        Walk 50 feet activity   Assist Walk 50 feet with 2 turns activity did not occur: Safety/medical concerns         Walk 150 feet activity   Assist Walk 150  feet activity did not occur: Safety/medical concerns         Walk 10 feet on uneven surface  activity   Assist Walk 10 feet on uneven surfaces activity did not occur: Safety/medical concerns         Wheelchair     Assist Is the patient using a wheelchair?: Yes Type of Wheelchair: Manual    Wheelchair assist level: Supervision/Verbal cueing Max wheelchair distance: 100 ft    Wheelchair 50 feet with 2 turns activity    Assist        Assist Level: Supervision/Verbal cueing   Wheelchair 150 feet activity     Assist      Assist Level: Moderate Assistance - Patient 50 - 74%   Blood pressure (!) 158/63, pulse 60, temperature 97.8 F (36.6 C), temperature source Oral, resp. rate 17, height 6' (1.829 m), weight 93 kg, SpO2 94 %.  Medical Problem List and Plan: 1. 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  Note specifies right TMA dressing to remain in place until 2/13 as well as history of left BKA 2019             -patient may  shower if cover R TMA/RLE             -ELOS/Goals: 7-10 days mod I-   2/24- d/c 2/28  Con't CIR- PT and OT- refusing darco shoe and self limiting 2.  Antithrombotics: -DVT/anticoagulation:  Pharmaceutical: Lovenox             -antiplatelet therapy: Aspirin 81 mg daily and Plavix 75 mg daily 3. Pain Management: Dilaudid 4 mg every 4 hours as needed  2/22- will need to f/u with surgeon to get pain meds at d/c- since they handle TMA- I don't need to see for rehab needs  2/24- con't pain regimen, however will need to f/u with surgeon for pain meds; not rehab- will not need PM&R f/u after d/c.  4. Mood: Provide emotional support             -antipsychotic agents: N/A 5. Neuropsych: This patient is capable of making decisions on his own behalf. 6. Skin/Wound Care: Routine skin checks 7. Fluids/Electrolytes/Nutrition: Routine in and outs with  follow-up chemistries 8.  Diabetes mellitus.  Hemoglobin A1c 11.3.  Semglee 24 units daily.  Check blood sugars before meals and at bedtime  CBG (last 3)  Recent Labs    01/18/22 1636 01/18/22 2120 01/19/22 0623  GLUCAP 189* 112* 214*   2/21- CBGs controlled- con't regimen- educated on insulin and  reminded how to take care of self DM- wise  2/24- CBGs somewhat more labile today- however hasn't been a trend, so will maintain DM regimen for today 9.  Hypertension.  Hydralazine 25 mg every 8 hours, Coreg 25 mg twice daily, Norvasc 10 mg daily, Avapro 75 mg daily.  Monitor with increased mobility   Vitals:   01/18/22 2003 01/19/22 0312  BP: (!) 128/59 (!) 158/63  Pulse: (!) 52 60  Resp: 18 17  Temp: 98.1 F (36.7 C) 97.8 F (36.6 C)  SpO2: 98% 94%   2/24- BP controlled- con't regimen 10.  Hyperlipidemia.  Lipitor 11.  CAD with stenting.  Continue aspirin and Plavix 12.  History of tobacco use.  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  13. L knee contracture- has BKA prosthesis- don't see a way to fix-  14. Scrotal swelling- since iliofemoral endarterectomy- will elevate.  15. New O2 requirement- will see if can wean off- has COPD  2/16- off O2  2/20- back on O2 as below #18  2/23- cannot wean below 2-3L at a time- CXR looks OK- likely COPD based on CXR and Sx's 16. RUE PICC- will see if able to get out?  2/16- will order for removal 17. Loose stools  2/16- will make colace prn and change senna to 1 tab BID- and have him hold bowel meds today  2/17- doing better- con't regimen 18.  Hypoxia due to fluid overload , ECHO `2wks ago looked nl although some technical limitations , responding to Lasix went from 6L to 4.5L O2, will increase IV Lasix to BID   2/20- willa dd flutter valve due to low lung volumes and ask nursing to give albuterol prn nebs.  2/21- Pt down to 3L O2- will con't flutter valve  2/24- asked pt to continue using flutter valve- don't think he's using since not on bedside table this  AM 19. AKI  2/20- Cr risen from 1.54 to 1.8- due to increase in Lasix. However O2 requirement is less- will monitor closely.   2/22- Cr down to 1.26- con't to monitor       LOS: 10 days A FACE TO FACE EVALUATION WAS PERFORMED  Antonio [name] 01/19/2022, 8:35 AM

## 2022-01-19 NOTE — Progress Notes (Signed)
Occupational Therapy Session Note  Patient Details  Name: Antonio [name]. MRN: [id number] Date of Birth: 03/11/1956  Today's Date: 01/19/2022 OT Individual Time: 0802-0900 OT Individual Time Calculation (min): 58 min    Short Term Goals: Week 2:  OT Short Term Goal 1 (Week 2): STG = LTG 2/2 LOS  Skilled Therapeutic Interventions/Progress Updates:    Pt in bed to start session, requesting to get washed up and complete grooming tasks at the sink.  He was able to donn his RLE prosthesis and already had his Darko shoe on the right foot.  He was able to complete squat pivot transfer to the wheelchair to the left with mod assist.  Oxygen sats at 95% on 3Ls nasal cannula with HR at 65 BPM.  Mod instructional cueing for purse lip breathing secondary to dyspnea at 2/4.  He was positioned at the sink in the wheelchair where he completed grooming tasks of washing his face, combing his hair, brushing his teeth, and shaving with setup.  Oxygen sats after grooming and without O2 at 92 % on room air.  After completion of grooming tasks and nursing bringing in meds, pt was rolled down to the therapy gym.  He needed mod assist for squat pivot transfer to the therapy mat.  Had him work on self AAROM shoulder flexion exercises sitting on the mat with use of a dowel rod.  Greater limitations in ROM in the LUE compared to the right.  He needed multiple rest breaks to complete 2 sets on each shoulder secondary to dyspnea.  Discussed extent of shoulder limitations from older events as well as brief discussion of other stretching exercises to the internal rotators of the shoulder.  Transferred to the wheelchair at mod assist squat pivot with pt returning to the room where he was left sitting up in the wheelchair with the call button and phone in reach and safety alarm belt in place.    Therapy Documentation Precautions:  Precautions Precautions: Fall Precaution Comments: s/p R TMA; prothesis for L prior BKA in  room, pt able Restrictions Weight Bearing Restrictions: Yes RLE Weight Bearing: Partial weight bearing RLE Partial Weight Bearing Percentage or Pounds: heel Other Position/Activity Restrictions: Per vascular sx note  Pain: Pain Assessment Pain Score: 6  Faces Pain Scale: Hurts a little bit PAINAD (Pain Assessment in Advanced Dementia) Breathing: normal Negative Vocalization: none Body Language: relaxed Consolability: no need to console ADL: See Care Tool Section for some details of mobility and selfcare   Therapy/Group: Individual Therapy  Eliakim Tendler OTR/L  01/19/2022, 12:57 PM

## 2022-01-19 NOTE — Progress Notes (Signed)
Occupational Therapy Session Note  Patient Details  Name: Antonio [name]. MRN: [id number] Date of Birth: 11-23-1956  Today's Date: 01/19/2022 OT Individual Time: 1300-1408 OT Individual Time Calculation (min): 68 min    Short Term Goals: Week 1:  OT Short Term Goal 1 (Week 1): Pt will complete LB dressing via lateral leans with supervision OT Short Term Goal 1 - Progress (Week 1): Progressing toward goal OT Short Term Goal 2 (Week 1): Pt will complete sit > stand in prep for ADL with Max A OT Short Term Goal 2 - Progress (Week 1): Progressing toward goal OT Short Term Goal 3 (Week 1): Pt will complete pericare with CGA for increased independence with toileting self-care OT Short Term Goal 3 - Progress (Week 1): Progressing toward goal Week 2:  OT Short Term Goal 1 (Week 2): STG = LTG 2/2 LOS   Skilled Therapeutic Interventions/Progress Updates:    Pt greeted at time of session sitting up in wheelchair, agreeable to OT session with encouargement stating he was very fatigued from previous sessions today. Did not report pain. Declined shower and ADL, stating he washed up this morning. Agreeable to going outside, and did not want to propel himself as he has a power chair at home. Transported room > outside > gym > back to room throughout session dependent as pt did not want to propel self. Seated outside for fresh air and uplifting mood, discussion with pt regarding DC home, DME that he has/needs, and assist from family at home with pt stating he has everything he needs and has no concerns when going home. Remainder of session in orth gym focused on BUE ROM and strengthening with 2# weight for bicep curl, chest press, overhead raises all for 1x15. 2 rounds of seated ball hits with lightweight beach ball and 1 round with smaller volleyball for additional challenge, all for 20 reps. Back in room, set up with alarm on call bell in reach all needs met.  Note O2 checked at rest in room at 88%,  with activity throughout session at 90-92% with activity. On room air throughout.   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, pt able Restrictions Weight Bearing Restrictions: Yes RLE Weight Bearing: Partial weight bearing RLE Partial Weight Bearing Percentage or Pounds: heel Other Position/Activity Restrictions: Per vascular sx note      Therapy/Group: Individual Therapy  Viona Gilmore 01/19/2022, 12:57 PM

## 2022-01-20 DIAGNOSIS — I10 Essential (primary) hypertension: Secondary | ICD-10-CM

## 2022-01-20 DIAGNOSIS — N179 Acute kidney failure, unspecified: Secondary | ICD-10-CM

## 2022-01-20 DIAGNOSIS — E1165 Type 2 diabetes mellitus with hyperglycemia: Secondary | ICD-10-CM

## 2022-01-20 LAB — GLUCOSE, CAPILLARY
Glucose-Capillary: 99 mg/dL (ref 70–99)
Glucose-Capillary: 165 mg/dL — ABNORMAL HIGH (ref 70–99)
Glucose-Capillary: 129 mg/dL — ABNORMAL HIGH (ref 70–99)
Glucose-Capillary: 173 mg/dL — ABNORMAL HIGH (ref 70–99)

## 2022-01-20 NOTE — Progress Notes (Signed)
Occupational Therapy Session Note  Patient Details  Name: Antonio [name]. MRN: [id number] Date of Birth: 08-05-1956  Today's Date: 01/21/2022 OT Individual Time: 1330-1415 OT Individual Time Calculation (min): 45 min   Short Term Goals: Week 1:  OT Short Term Goal 1 (Week 1): Pt will complete LB dressing via lateral leans with supervision OT Short Term Goal 1 - Progress (Week 1): Progressing toward goal OT Short Term Goal 2 (Week 1): Pt will complete sit > stand in prep for ADL with Max A OT Short Term Goal 2 - Progress (Week 1): Progressing toward goal OT Short Term Goal 3 (Week 1): Pt will complete pericare with CGA for increased independence with toileting self-care OT Short Term Goal 3 - Progress (Week 1): Progressing toward goal  Skilled Therapeutic Interventions/Progress Updates:    Pt greeted in bed with no c/o pain, just had a bad bout of diarrhea last night and this morning. He declined shower or ADLs, declined transfer to the w/c. Anticipating family to come to bring him food soon. Pt participated in bar taps using 3# bar when OT tossed ball to work on UB strength needed for self care tasks and transfers. Also participated in zoom ball exercises to work on chest expansion/stretching of muscles anterior chest to assist with respiratory capacities. Pt on 3L 02 during tx, sats remained 96% at rest. At end of tx pt asked to have 02 removed due to grad day tomorrow and not wanting to use 02 at home. Pt rested for >5 minutes without nasal canula and sats 90-91% on RA. Agreeable to keep nasal canula off at rest, nasal canula within reach in case he became SOB. Pt verbalized understanding. Informed nurse. Pt remained in bed at close of session, eating food that family brought in. All needs within reach and bed alarm set.   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, pt able Restrictions Weight  Bearing Restrictions: Yes RLE Weight Bearing: Partial weight bearing RLE Partial Weight Bearing Percentage or Pounds: heel Other Position/Activity Restrictions: Per vascular sx note ADL: ADL Eating: Set up Where Assessed-Eating: Edge of bed Grooming: Setup Where Assessed-Grooming: Edge of bed Upper Body Bathing: Supervision/safety Where Assessed-Upper Body Bathing: Edge of bed Lower Body Bathing: Minimal assistance Where Assessed-Lower Body Bathing: Edge of bed Upper Body Dressing: Minimal assistance Where Assessed-Upper Body Dressing: Edge of bed Lower Body Dressing: Moderate assistance Where Assessed-Lower Body Dressing: Edge of bed Toileting: Maximal assistance Where Assessed-Toileting: Bedside Commode Toilet Transfer: Moderate assistance Toilet Transfer Method: Squat pivot Toilet Transfer Equipment: Drop arm bedside commode Tub/Shower Transfer: Unable to assess Psychologist, counselling Transfer: Unable to assess  Therapy/Group: Individual Therapy  [name] A [name] 01/21/2022, 4:11 PM

## 2022-01-20 NOTE — Progress Notes (Signed)
PROGRESS NOTE   Subjective/Complaints: Patient seen sitting up in bed this morning.  He states he slept well overnight.  He states he believes he is improving.  ROS:  Pt denies SOB, abd pain, CP, N/V/C/D, and vision changes       Objective:   No results found. No results for input(s): WBC, HGB, HCT, PLT in the last 72 hours.  No results for input(s): NA, K, CL, CO2, GLUCOSE, BUN, CREATININE, CALCIUM in the last 72 hours.   Intake/Output Summary (Last 24 hours) at 01/20/2022 1149 Last data filed at 01/20/2022 0945 Gross per 24 hour  Intake 480 ml  Output 1425 ml  Net -945 ml         Physical Exam: Vital Signs Blood pressure (!) 137/57, pulse (!) 57, temperature 98.8 F (37.1 C), temperature source Oral, resp. rate 18, height 6' (1.829 m), weight 93 kg, SpO2 94 %.  General: awake, alert, appropriate HENT: conjugate gaze; oropharynx moist CV: regular rate; no JVD Pulmonary: CTA B/L; no W/R/R- good air movement GI: soft, NT, ND, (+)BS Psychiatric: appropriate Neurological: Alert Genitourinary: Scrotal edema appears to be improving Musculoskeletal:  L BKA healed well- no redness R TMA- in ACE wrap Skin: Right TMA dressing in place.  Appropriately tender- covered with ACE wrap Right inguinal rash appears to be improving  Assessment/Plan: 1. Functional deficits which require 3+ hours per day of interdisciplinary therapy in a comprehensive inpatient rehab setting. Physiatrist is providing close team supervision and 24 hour management of active medical problems listed below. Physiatrist and rehab team continue to assess barriers to discharge/monitor patient progress toward functional and medical goals  Care Tool:  Bathing    Body parts bathed by patient: Right arm, Left arm, Chest, Abdomen, Front perineal area, Buttocks, Right upper leg, Left upper leg, Face   Body parts bathed by helper: Right lower  leg Body parts n/a: Left lower leg, Right lower leg   Bathing assist Assist Level: Supervision/Verbal cueing     Upper Body Dressing/Undressing Upper body dressing   What is the patient wearing?: Pull over shirt    Upper body assist Assist Level: Set up assist    Lower Body Dressing/Undressing Lower body dressing      What is the patient wearing?: Pants     Lower body assist Assist for lower body dressing: Moderate Assistance - Patient 50 - 74%     Toileting Toileting    Toileting assist Assist for toileting: Independent with assistive device Assistive Device Comment: urinal   Transfers Chair/bed transfer  Transfers assist  Chair/bed transfer activity did not occur: Safety/medical concerns  Chair/bed transfer assist level: Moderate Assistance - Patient 50 - 74%     Locomotion Ambulation   Ambulation assist   Ambulation activity did not occur: Safety/medical concerns          Walk 10 feet activity   Assist  Walk 10 feet activity did not occur: Safety/medical concerns        Walk 50 feet activity   Assist Walk 50 feet with 2 turns activity did not occur: Safety/medical concerns         Walk 150 feet activity  Assist Walk 150 feet activity did not occur: Safety/medical concerns         Walk 10 feet on uneven surface  activity   Assist Walk 10 feet on uneven surfaces activity did not occur: Safety/medical concerns         Wheelchair     Assist Is the patient using a wheelchair?: Yes Type of Wheelchair: Manual    Wheelchair assist level: Supervision/Verbal cueing Max wheelchair distance: 100 ft    Wheelchair 50 feet with 2 turns activity    Assist        Assist Level: Supervision/Verbal cueing   Wheelchair 150 feet activity     Assist      Assist Level: Moderate Assistance - Patient 50 - 74%   Blood pressure (!) 137/57, pulse (!) 57, temperature 98.8 F (37.1 C), temperature source Oral, resp. rate  18, height 6' (1.829 m), weight 93 kg, SpO2 94 %.  Medical Problem List and Plan: 1. 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  Note specifies right TMA dressing to remain in place until 2/13 as well as history of left BKA 2019 Continue CIR  2.  Antithrombotics: -DVT/anticoagulation:  Pharmaceutical: Lovenox             -antiplatelet therapy: Aspirin 81 mg daily and Plavix 75 mg daily 3. Pain Management: Dilaudid 4 mg every 4 hours as needed  2/22- will need to f/u with surgeon to get pain meds at d/c- since they handle TMA- I don't need to see for rehab needs  2/24- con't pain regimen, however will need to f/u with surgeon for pain meds; not rehab- will not need PM&R f/u after d/c.  4. Mood: Provide emotional support             -antipsychotic agents: N/A 5. Neuropsych: This patient is capable of making decisions on his own behalf. 6. Skin/Wound Care: Routine skin checks 7. Fluids/Electrolytes/Nutrition: Routine in and outs  8.  Uncontrolled diabetes mellitus with hyperglycemia.  Hemoglobin A1c 11.3.  Semglee 24 units daily.  Check blood sugars before meals and at bedtime  CBG (last 3)  Recent Labs    01/19/22 2042 01/20/22 0552 01/20/22 1124  GLUCAP 129* 99 165*    2/21- CBGs controlled- con't regimen- educated on insulin and reminded how to take care of self DM- wise  2/24- CBGs somewhat more labile today- however hasn't been a trend, so will maintain DM regimen for today  2/25-slightly labile, continue to monitor for trend 9.  Hypertension.  Hydralazine 25 mg every 8 hours, Coreg 25 mg twice daily, Norvasc 10 mg daily, Avapro 75 mg daily.  Monitor with increased mobility   Vitals:   01/19/22 1920 01/20/22 0409  BP: (!) 115/53 (!) 137/57  Pulse: (!) 53 (!) 57  Resp: 18 18  Temp: 97.7 F (36.5 C) 98.8 F (37.1 C)  SpO2: 96% 94%   Relatively controlled on 2/25 10.  Hyperlipidemia.   Lipitor 11.  CAD with stenting.  Continue aspirin and Plavix 12.  History of tobacco use.  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  13. L knee contracture- has BKA prosthesis- don't see a way to fix-  14. Scrotal swelling- since iliofemoral endarterectomy- will elevate.   Improving 15. New O2 requirement- will see if can wean off- has COPD  2/16- off O2  2/20- back on O2 as below #18  2/23- cannot wean below 2-3L at a time- CXR looks OK- likely COPD based  on CXR and Sx's 16. RUE PICC- will see if able to get out?  2/16- will order for removal 17. Loose stools  2/16- will make colace prn and change senna to 1 tab BID- and have him hold bowel meds today  2/17- doing better- con't regimen 18.  Hypoxia due to fluid overload , ECHO `2wks ago looked nl although some technical limitations , responding to Lasix went from 6L to 4.5L O2, will increase IV Lasix to BID   2/20- willa dd flutter valve due to low lung volumes and ask nursing to give albuterol prn nebs.  2/21- Pt down to 3L O2- will con't flutter valve  2/24- asked pt to continue using flutter valve- don't think he's using since not on bedside table this AM 19. AKI  2/20- Cr risen from 1.54 to 1.8- due to increase in Lasix. However O2 requirement is less- will monitor closely.   Creatinine 1.26 on 2/22, continue to monitor       LOS: 11 days A FACE TO FACE EVALUATION WAS PERFORMED  Vander Kueker Lorie Phenix 01/20/2022, 11:49 AM

## 2022-01-21 LAB — GLUCOSE, CAPILLARY
Glucose-Capillary: 134 mg/dL — ABNORMAL HIGH (ref 70–99)
Glucose-Capillary: 136 mg/dL — ABNORMAL HIGH (ref 70–99)
Glucose-Capillary: 282 mg/dL — ABNORMAL HIGH (ref 70–99)

## 2022-01-21 NOTE — Progress Notes (Signed)
Occupational Therapy Session Note  Patient Details  Name: Antonio [name]. MRN: [id number] Date of Birth: 1956-07-16  Today's Date: 01/22/2022 OT Individual Time: 0750-0830 OT Individual Time Calculation (min): 40 min    Skilled Therapeutic Interventions/Progress Updates:    Pt greeted in bed, reporting pain in groin and that he hadn't slept at all last night. Rest/repositioning provided during session to address pain therapeutically. Notified nurse at end of session that pt would like some pain medicine. Noted that pt was wearing supplemental 02 at start of session, set at 1L. Pt reported that he desatted into the 80s last night with staff and needed to reapply nasal canula. Willing to remove nasal canula during session, he does not plan to have supplemental 02 at home. 02 sats throughout session when assessed 90-91% on room air. Supervision for squat pivot<w/c, pt needing cues and assist to set up DME in prep for transfer. Setup for donning prosthetic prior. He continues to refuse wearing offloading shoe in prep for transfer though is educated/encouraged to do so. While seated at the sink, pt completed oral care and grooming tasks (shaving) at Mod I level. He tried to don pants w/c level with supervision but we ultimately decided that it would be safer to do this sitting EOB. Supervision for transfer back to EOB where pt completed UB/LB dressing with setup assist and increased time. Pt assisted with providing d/c information and was then left in bed, all needs within reach and bed alarm set.   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, pt able to don independently Restrictions Weight Bearing Restrictions: Yes RLE Weight Bearing: Partial weight bearing RLE Partial Weight Bearing Percentage or Pounds: heel Other Position/Activity Restrictions: Per vascular sx note ADL: ADL Eating:  Independent Where Assessed-Eating: Edge of bed Grooming: Modified independent Where Assessed-Grooming: Sitting at sink Upper Body Bathing: Setup Where Assessed-Upper Body Bathing: Edge of bed Lower Body Bathing: Supervision/safety Where Assessed-Lower Body Bathing: Edge of bed Upper Body Dressing: Setup Where Assessed-Upper Body Dressing: Edge of bed Lower Body Dressing: Minimal assistance Where Assessed-Lower Body Dressing: Edge of bed Toileting: Supervision/safety Where Assessed-Toileting: Teacher, adult education: Close supervision Statistician Method: Ambulance person: Chiropractor Transfer: Unable to Engineer, technical sales: Close supervision Film/video editor Method: [employer] pivot [employer]: Emergency planning/management officer, Grab bars  Therapy/Group: Individual Therapy  Colbie Danner A Zanaria Morell 01/22/2022, 12:53 PM

## 2022-01-21 NOTE — Progress Notes (Signed)
Occupational Therapy Discharge Summary  Patient Details  Name: Antonio [name]. MRN: [id number] Date of Birth: 1956/08/03   Patient has met 1 of 9 long term goals due to improved activity tolerance, improved balance, postural control, and ability to compensate for deficits. Patient to discharge at overall Supervision-Min A level. Pt continues to require cues for safety during all self care tasks and transfers. Pt verbalizes understanding in regards to therapeutic safety cues, he just does not implement safety measures because he personally disagrees. Pt has the cognitive capacity to be Mod I however cannot meet goals because of argumentative and self limiting behaviors. Patient reports that his family can provide the needed assistance at d/c, lives with mother and pts daughter and he was functioning at a w/c level prior to this hospital admission.    Reasons goals not met: Multiple goals not met due to pts argumentative and self limiting behaviors  Recommendation:  Patient will benefit from ongoing skilled OT services in home health setting to continue to advance functional skills in the area of BADL and iADL.  Equipment: No equipment provided. 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, pt able to don independently Restrictions Weight Bearing Restrictions: Yes RLE Weight Bearing: Partial weight bearing RLE Partial Weight Bearing Percentage or Pounds: heel ADL ADL Eating: Independent Where Assessed-Eating: Edge of bed Grooming: Modified independent Where Assessed-Grooming: Sitting at sink Upper Body Bathing: Setup Where Assessed-Upper Body Bathing: Edge of bed Lower Body Bathing: Supervision/safety Where Assessed-Lower  Body Bathing: Edge of bed Upper Body Dressing: Setup Where Assessed-Upper Body Dressing: Edge of bed Lower Body Dressing: Minimal assistance Where Assessed-Lower Body Dressing: Edge of bed Toileting: Supervision/safety Where Assessed-Toileting: Glass blower/designer: Close supervision Toilet Transfer Method: Engineer, water: Energy manager: Unable to English as a second language teacher: Close supervision Social research officer, government Method: Education officer, environmental: Radio broadcast assistant, Grab bars Vision Baseline Vision/History: 1 Wears glasses Patient Visual Report: No change from baseline Vision Assessment?: No apparent visual deficits Perception  Perception: Within Functional Limits Praxis Praxis: Intact Cognition Overall Cognitive Status: No family/caregiver present to determine baseline cognitive functioning Arousal/Alertness: Awake/alert Orientation Level: Oriented X4 Year: 2023 Month: February Day of Week: Correct Memory: Appears intact Immediate Memory Recall: Sock;Blue;Bed Memory Recall Sock: With Cue Memory Recall Blue: Without Cue Memory Recall Bed: Without Cue Problem Solving: Appears intact Safety/Judgment: Impaired Sensation Sensation Light Touch: Impaired by gross assessment Proprioception: Appears Intact Stereognosis: Not tested Coordination Gross Motor Movements are Fluid and Coordinated: No Fine Motor Movements are Fluid and Coordinated: No Coordination and Movement Description: Affected by limb loss and resultant balance deficits, deficits with fine motor control and strength Motor  Motor Motor: Other (comment) Motor - Skilled Clinical Observations: debilitated from long hospital course Motor - Discharge Observations: affected by limb loss and debility from hospital journey Mobility  Bed Mobility Bed Mobility: Sit to Supine;Supine to Sit Supine to Sit: Independent with assistive device Sit to Supine:  Independent with assistive device  Trunk/Postural Assessment  Cervical Assessment Cervical Assessment: Within Functional Limits Thoracic Assessment Thoracic Assessment: Within Functional Limits Lumbar Assessment Lumbar Assessment: Within Functional Limits Postural Control Postural Control: Within Functional Limits  Balance Balance Balance Assessed: Yes Static Sitting Balance Static Sitting - Balance  Support: No upper extremity supported Static Sitting - Level of Assistance: 6: Modified independent (Device/Increase time) Dynamic Sitting Balance Dynamic Sitting - Balance Support: During functional activity Dynamic Sitting - Level of Assistance: 6: Modified independent (Device/Increase time) (donning pants EOB) Extremity/Trunk Assessment RUE Assessment RUE Assessment: Within Functional Limits LUE Assessment LUE Assessment: Within Functional Limits   Michaela A Hoffman 01/22/2022, 12:40 PM

## 2022-01-21 NOTE — Progress Notes (Signed)
Physical Therapy Session Note  Patient Details  Name: Antonio [name]. MRN: [id number] Date of Birth: 01/12/1956  Today's Date: 01/21/2022 PT Individual Time: 0900-0945 PT Individual Time Calculation (min): 45 min   Short Term Goals: Week 2:  PT Short Term Goal 1 (Week 2): =LTGs d/t ELOS  Skilled Therapeutic Interventions/Progress Updates: Pt presented sitting EOB agreeable to therapy with encouragement. Pt states has had several bouts of diarrhea overnight and this morning. Pt requesting bed level activity this am due to stomach pains. Pt participated in UE therex as noted below. Pt required increased rest breaks due to nausea and groin pain. Pt received pain meds during session. Pt participated in the following activities all performed 2 x 15. Biceps curl, chest press, shoulder flexion, shoulder horiz abd/add all performed with 3lb dowel. Pt also performed rows with red resistance band, and shoulder er with yellow resistance band. Pt with notable increased discomfort throughout session and requiring more time for recovery. When discussed with pt continues to have discomfort in stomach. Agreeable to terminate session early. Pt left sitting EOB at end of session with bed alarm on, call bell within reach and needs met.      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, pt able Restrictions Weight Bearing Restrictions: Yes RLE Weight Bearing: Partial weight bearing RLE Partial Weight Bearing Percentage or Pounds: heel Other Position/Activity Restrictions: Per vascular sx note General: PT Amount of Missed Time (min): 15 Minutes PT Missed Treatment Reason: Patient ill (Comment);Patient fatigue Vital Signs: Therapy Vitals Temp: 98.6 F (37 C) Temp Source: Oral Pulse Rate: 78 Resp: 14 BP: 138/65 Patient Position (if appropriate): Lying Oxygen Therapy SpO2: 96 % O2 Device: Nasal  Cannula Pain:   Mobility:   Locomotion :    Trunk/Postural Assessment :    Balance:   Exercises:   Other Treatments:      Therapy/Group: Individual Therapy  [name] 01/21/2022, 4:24 PM

## 2022-01-21 NOTE — Discharge Summary (Signed)
Physician Discharge Summary  Patient ID: Antonio [name]. MRN: [id number] DOB/AGE: 05/05/1956 66 y.o.  Admit date: 01/09/2022 Discharge date: 01/23/2022  Discharge Diagnoses:  Principal Problem:   S/P transmetatarsal amputation of foot, right ([place]) Active Problems:   AKI (acute kidney injury) ([place])   Benign essential HTN   Uncontrolled type 2 diabetes mellitus with hyperglycemia (HCC) DVT prophylaxis Hyperlipidemia CAD with stenting History of tobacco use Hypoxia AKI  Discharged Condition: Stable  Significant Diagnostic Studies: DG Chest 2 View  Result Date: 01/12/2022 CLINICAL DATA:  Hypoxia. EXAM: CHEST - 2 VIEW COMPARISON:  Radiograph 01/05/2022, CT 12/28/2021 FINDINGS: Lower lung volumes from prior exam. Small bilateral pleural effusions have developed from prior exam. Associated streaky bibasilar opacities. There is vascular congestion with question of pulmonary edema. Heart size difficult to assess due to low lung volumes and bibasilar opacities, may be mildly enlarged. No pneumothorax IMPRESSION: 1. Lower lung volumes from prior exam. 2. Small bilateral pleural effusions and bibasilar opacities, likely atelectasis. 3. Vascular congestion with question of pulmonary edema. Suspected degree of fluid overload/CHF. Electronically Signed   By: Keith Rake M.D.   On: 01/12/2022 21:49   CT Angio Aortobifemoral W and/or Wo Contrast  Result Date: 12/28/2021 CLINICAL DATA:  66 year old male with history of peripheral artery disease presenting with right fifth metatarsal osteomyelitis. EXAM: CT ANGIOGRAPHY OF ABDOMINAL AORTA WITH ILIOFEMORAL RUNOFF TECHNIQUE: Multidetector CT imaging of the abdomen, pelvis and lower extremities was performed using the standard protocol during bolus administration of intravenous contrast. Multiplanar CT image reconstructions and MIPs were obtained to evaluate the vascular anatomy. RADIATION DOSE REDUCTION: This exam was performed according to the  departmental dose-optimization program which includes automated exposure control, adjustment of the mA and/or kV according to patient size and/or use of iterative reconstruction technique. CONTRAST:  176mL OMNIPAQUE IOHEXOL 350 MG/ML SOLN COMPARISON:  None. FINDINGS: VASCULAR Aorta: The abdominal aorta is normal in caliber and patent throughout. There is significant infrarenal fibrofatty and calcific atherosclerotic disease resulting in approximately 50% focal luminal stenosis immediately inferior to the level of the renal arteries. Celiac: Patent without evidence of aneurysm, dissection, vasculitis or significant stenosis. SMA: Patent without evidence of aneurysm, dissection, vasculitis or significant stenosis. Renals: Dual bilateral renal arteries. The inferior right renal artery demonstrates focal high-grade ostial stenosis secondary to atherosclerotic plaque. The remaining renal arteries appear patent. IMA: Patent without evidence of aneurysm, dissection, vasculitis or significant stenosis. RIGHT Lower Extremity Inflow: Common, internal and external iliac arteries are patent. Near circumferential fibrofatty and calcific atherosclerotic changes with at least mild multifocal stenoses. Suggestion of severe focal stenosis at the level of the right inguinal ligament (series 4, image 131). Outflow: The common femoral artery is patent with circumferential atherosclerotic calcifications resulting in at least mild multifocal stenoses. The profunda is patent. The superficial femoral artery is patent throughout with near circumferential coarse atherosclerotic calcifications resulting in at least mild multifocal stenoses. The popliteal artery is patent throughout. Runoff: The anterior tibial artery is patent throughout. Focal short segment occlusion of the proximal tibioperoneal trunk with reconstitution via the peroneal artery which is patent to the level of the ankle. The posterior tibial artery appears occluded  throughout. LEFT Lower Extremity Common iliac artery is patent. Severe ostial stenosis of the internal iliac artery secondary to atherosclerotic plaque. Coarse circumferential atherosclerotic plaque throughout the external iliac arteries [place] at least moderate multifocal stenoses. The left common femoral artery is patent. The profundus is patent. Chronic appearing total occlusion of indwelling left superficial femoral  artery stent. Review of the MIP images confirms the above findings. NON-VASCULAR Lower chest: No acute abnormality. Hepatobiliary: Mildly decreased attenuation of the hepatic parenchyma. Smooth contour normal size. No focal lesions. The gallbladder is present unremarkable. No intra or extrahepatic biliary ductal dilation. Pancreas: Unremarkable. No pancreatic ductal dilatation or surrounding inflammatory changes. Spleen: No splenic injury or perisplenic hematoma. Adrenals/Urinary Tract: Adrenal glands are unremarkable. Right lower pole anterior cortical simple renal cysts measuring approximately 13 mm in maximum axial diameter. Kidneys are otherwise normal, without renal calculi, focal lesion, or hydronephrosis. Bladder is unremarkable. Stomach/Bowel: Stomach is within normal limits. Appendix is not definitively visualized. No evidence of bowel wall thickening, distention, or inflammatory changes. Lymphatic: No abdominopelvic lymphadenopathy. Reproductive: Prostate is unremarkable. Other: No abdominal wall hernia or abnormality. No abdominopelvic ascites. Musculoskeletal: Cortical destruction of the right fifth proximal middle, and distal phalanx compatible with known osteomyelitis. Diffuse right lower extremity edema, most prominent below the knee. No evidence of subcutaneous gas or discrete fluid collections. Status post left below the knee amputation. No evidence acute fracture or malalignment. IMPRESSION: VASCULAR 1. Advanced aortoiliac atherosclerotic disease resulting in approximately 50% focal  stenosis of the infrarenal abdominal aorta. 2. Severe focal stenosis of the distal right external iliac artery at the level of the inguinal ligament. Patent right lower extremity runoff vessels with near circumferential atherosclerotic calcifications. Proximal right tibioperoneal trunk occlusion with two vessel runoff to the level of the ankle via the anterior tibial and peroneal arteries. 3. Chronic occlusion of the indwelling left superficial femoral artery stent. NON-VASCULAR 1. Cortical destruction of the right fifth phalanges in keeping with history of osteomyelitis. Diffuse right lower extremity edema, was prominent below the knee. 2. Mild hepatic steatosis. Ruthann Cancer, MD [facility] Electronically Signed   By: Ruthann Cancer M.D.   On: 12/28/2021 16:16   PERIPHERAL VASCULAR CATHETERIZATION  Result Date: 12/29/2021 DATE OF SERVICE: 12/29/2021  PATIENT:  Antonio [name].  66 y.o. male  PRE-OPERATIVE DIAGNOSIS:  Atherosclerosis of native arteries of right lower extremity causing diabetic foot infection; osteomyelitis  POST-OPERATIVE DIAGNOSIS:  Same  PROCEDURE:  1) US guided left common femoral artery access 2) Aortogram 3) Right lower extremity angiogram with second order cannulation (177mL total contrast) 4) Conscious sedation (33 minutes)  SURGEON:  Yevonne Aline. Stanford Breed, MD  ASSISTANT: none  ANESTHESIA:   none  ESTIMATED BLOOD LOSS: minimal  LOCAL MEDICATIONS USED:  LIDOCAINE  COUNTS: confirmed correct.  PATIENT DISPOSITION:  PACU - hemodynamically stable.  Delay start of Pharmacological VTE agent (>24hrs) due to surgical blood loss or risk of bleeding: no  INDICATION FOR PROCEDURE: Antonio Spata. is a 66 y.o. male with osteomyelitis of the right foot with imaging evidence of severe PAD. After careful discussion of risks, benefits, and alternatives the patient was offered angiography with possible intervention. The patient understood and  wished to proceed.  OPERATIVE FINDINGS: Terminal aorta and iliac arteries: Diffuse disease, heavy calcification without significant stenosis  Right lower extremity: Common femoral artery: Heavily diseased with 2 areas of significant stenosis (greater than 75%). Profunda femoris artery: Heavily diseased.  Possible ostial stenosis limiting flow Superficial femoral artery: Heavily diseased.  Diffuse stenosis, but not greater than 50%.  1 area at Hunter's canal about 60 to 70% stenosis. Popliteal artery: Patent without significant stenosis in the above and behind knee segment.  1 area of significant stenosis above the anterior tibial artery (about 60%) Anterior tibial artery: Dominant tibial vessel.  This courses to the foot.  Generous tibial artery. Tibioperoneal trunk: Occluded Peroneal artery: Occluded until the mid calf where it reconstitutes. Posterior tibial artery: Occluded Pedal circulation: Disadvantaged.  Fills via the anterior tibial circulation.  GLASS score. Grade 3: expect 53% 1 year re-intervention rate; expect 69% 5 year re-intervention rate; expect 49% 5 year mortality; expect 61% rate of restenosis from open operation at 5 years; expect 83% rate of restenosis from endovascular procedure..  DESCRIPTION OF PROCEDURE: After identification of the patient in the pre-operative holding area, the patient was transferred to the operating room. The patient was positioned supine on the operating room table. Anesthesia was induced. The groins was prepped and draped in standard fashion. A surgical pause was performed confirming correct patient, procedure, and operative location.  The left groin was anesthetized with subcutaneous injection of 1% lidocaine. Using ultrasound guidance, the left common femoral artery was accessed with micropuncture technique. Fluoroscopy was used to confirm cannulation over the femoral head. The 24F sheath was upsized to 42F.  A Benson wire was advanced into the distal aorta. Over the  wire an omni flush catheter was advanced to the level of L2. Aortogram was performed - see above for details.  The right common iliac artery was selected with a Benson guidewire. The wire was advanced into the common femoral artery. Over the wire the omni flush catheter was advanced into the external iliac artery. Selective right lower extremity angiography was performed - see above for details.  The sheath was left in place to be removed in the recovery area.  Conscious sedation was administered with the use of IV fentanyl and midazolam under continuous physician and nurse monitoring.  Heart rate, blood pressure, and oxygen saturation were continuously monitored.  Total sedation time was 33 minutes  Upon completion of the case instrument and sharps counts were confirmed correct. The patient was transferred to the PACU in good condition. I was present for all portions of the procedure.  PLAN: Severe, global burden of calcific atherosclerosis.  Distal external iliac and common femoral artery disease not amenable to stenting.  Diffuse femoral-popliteal disease with hemodynamically significant stenosis.  Anterior tibial artery and attractive target, but pedal outflow disadvantaged.  He is at high risk for limb loss.  Discussed the case with Dr. Unk Lightning.  I think a iliofemoral endarterectomy and femoral AT bypass is his best option we will plan to do this on Monday.  Check ABI.  Check vein mapping.  We will ask cardiology service to risk stratify for open vascular surgery.  Yevonne Aline. Stanford Breed, MD [facility] Phone Number: [phone] 12/29/2021 8:34 AM    DG CHEST PORT 1 VIEW  Result Date: 01/15/2022 CLINICAL DATA:  Recent toe amputation, shortness of breath, cough, peripheral edema, diabetes EXAM: PORTABLE CHEST 1 VIEW COMPARISON:  01/12/2022 FINDINGS: Similar low lung volumes with bibasilar airspace opacities versus basilar edema. There is also associated scattered lower lobe  atelectasis and small effusions. No significant change compared to 01/12/2022. No pneumothorax. Heart is obscured. Trachea midline. No significant osseous finding. IMPRESSION: Persistent low lung volumes with small effusions and basilar opacities versus edema. Similar bibasilar atelectasis. Electronically Signed   By: Jerilynn Mages.  Shick M.D.   On: 01/15/2022 09:15   DG CHEST PORT 1 VIEW  Result Date: 01/05/2022 CLINICAL DATA:  Hypoxia EXAM: PORTABLE CHEST 1 VIEW COMPARISON:  Portable exam 1332 hours compared to 02/04/2019 FINDINGS: Upper normal size of cardiac silhouette. Mediastinal contours and pulmonary vascularity  normal. Atherosclerotic calcification aorta. Bibasilar atelectasis. Remaining lungs clear. No pleural effusion or pneumothorax. Bones demineralized. IMPRESSION: Bibasilar atelectasis. Aortic Atherosclerosis (ICD10-I70.0). Electronically Signed   By: Lavonia Dana M.D.   On: 01/05/2022 14:04   DG Foot Complete Right  Result Date: 12/28/2021 CLINICAL DATA:  Lateral foot ulceration. Ulceration is all over right foot. 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 Is irregularity of the soft tissues lateral to the fifth metatarsal head. In this region there is moderate cortical destruction of the base of the proximal phalanx and the distal aspect of the metatarsal head of the fifth ray. There is resultant mild medial subluxation of fifth toe proximal phalanx. There is again apparent attenuation of the distal aspect of the cuneiforms and cuboid and dorsal subluxation of the second through fifth metatarsals (and possibly the remaining base of the first metatarsal). IMPRESSION:: IMPRESSION: 1. Soft tissue wound lateral to the fifth metatarsal head with new cortical erosion on both sides of fifth metatarsophalangeal joint indicating osteomyelitis. 2. No significant  change in amputation of the first ray to the base of the first metatarsal. 3. No significant change in dorsal subluxation of the base of second through fifth metatarsals. Electronically Signed   By: Yvonne Kendall M.D.   On: 12/28/2021 08:08   CT ANGIO CHEST AORTA W/CM &/OR WO/CM  Result Date: 12/28/2021 CLINICAL DATA:  Leg claudication/ischemia. History of vascular disease. Clinical concern for aortic aneurysm. Ex-smoker. EXAM: CT ANGIOGRAPHY CHEST WITH CONTRAST TECHNIQUE: Multidetector CT imaging of the chest was performed using the standard protocol during bolus administration of intravenous contrast. Multiplanar CT image reconstructions and MIPs were obtained to evaluate the vascular anatomy. RADIATION DOSE REDUCTION: This exam was performed according to the departmental dose-optimization program which includes automated exposure control, adjustment of the mA and/or kV according to patient size and/or use of iterative reconstruction technique. CONTRAST:  14mL OMNIPAQUE IOHEXOL 350 MG/ML SOLN COMPARISON:  Portable chest dated 02/04/2019. Chest, abdomen and pelvis CT dated 11/28/2018 FINDINGS: Cardiovascular: Mild atheromatous aortic calcifications. No aneurysm or dissection. Normal sized heart. Moderate plaque formation in the posterior aspect of the abdominal aorta at the level of the renal arteries, causing approximately 40% luminal stenosis. Mediastinum/Nodes: No enlarged mediastinal, hilar, or axillary lymph nodes. Thyroid gland, trachea, and esophagus demonstrate no significant findings. Lungs/Pleura: 4 mm left lower lobe anterior nodule abutting the major fissure, unchanged. This is compatible with a stable subpleural lymph node. No new nodules. Mild bilateral centrilobular bullous changes. Upper Abdomen: Diffuse low density of the liver. Musculoskeletal: Thoracic and lower cervical spine degenerative changes, including changes of DISH. Review of the MIP images confirms the above findings. IMPRESSION: 1.  No aortic aneurysm or dissection. 2. Aortic atherosclerosis, as described above. 3. Mild changes of centrilobular emphysema. 4. Diffuse hepatic steatosis. Aortic Atherosclerosis (ICD10-I70.0) and Emphysema (ICD10-J43.9). Electronically Signed   By: Claudie Revering M.D.   On: 12/28/2021 14:57   VAS [place] ABI WITH/WO TBI  Result Date: 12/30/2021  LOWER EXTREMITY DOPPLER STUDY Patient Name:  Kolson WATSON Biancardi JR.  Date of Exam:   12/29/2021 Medical Rec #: [id number]               Accession #:    [id number] Date of Birth: 1956-10-21               Patient Gender: M Patient Age:   50  years Exam Location:  [facility] Procedure:      VAS [place] ABI WITH/WO TBI Referring Phys: Jamelle Haring --------------------------------------------------------------------------------  Indications: Pre-op. Gangrene, peripheral artery disease, and left BKA and Right              great toe amputation. High Risk Factors: Hypertension, hyperlipidemia, Diabetes, past history of                    smoking, coronary artery disease.  Comparison Study: 12/29/2015 ABI- right=0.78, left=0.78 Performing Technologist: Maudry Mayhew MHA, RVT, RDCS, RDMS  Examination Guidelines: A complete evaluation includes at minimum, Doppler waveform signals and systolic blood pressure reading at the level of bilateral brachial, anterior tibial, and posterior tibial arteries, when vessel segments are accessible. Bilateral testing is considered an integral part of a complete examination. Photoelectric Plethysmograph (PPG) waveforms and toe systolic pressure readings are included as required and additional duplex testing as needed. Limited examinations for reoccurring indications may be performed as noted.  ABI Findings: +---------+------------------+-----+-------------------+----------+  Right     Rt Pressure (mmHg) Index Waveform            Comment     +---------+------------------+-----+-------------------+----------+  Brachial  177                      triphasic                        +---------+------------------+-----+-------------------+----------+  PTA       122                0.69  monophasic                      +---------+------------------+-----+-------------------+----------+  DP        61                 0.34  dampened monophasic             +---------+------------------+-----+-------------------+----------+  Great Toe                                              amputation  +---------+------------------+-----+-------------------+----------+ +---------+------------------+-----+--------+----------+  Left      Lt Pressure (mmHg) Index Waveform Comment     +---------+------------------+-----+--------+----------+  Brachial  162                      biphasic             +---------+------------------+-----+--------+----------+  PTA                                         amputation  +---------+------------------+-----+--------+----------+  DP                                          amputation  +---------+------------------+-----+--------+----------+  Great Toe                                   amputation  +---------+------------------+-----+--------+----------+ +-------+-----------+-----------+------------+------------+  ABI/TBI Today's ABI Today's TBI Previous ABI Previous TBI  +-------+-----------+-----------+------------+------------+  Right   0.69        amputation  0.78                       +-------+-----------+-----------+------------+------------+  Left    BKA         BKA         0.78                       +-------+-----------+-----------+------------+------------+   Summary: Right: Resting right ankle-brachial index indicates moderate right lower extremity arterial disease.  *See table(s) above for measurements and observations.  Electronically signed by Harold Barban MD on 12/30/2021 at 5:30:31 PM.    Final    VAS [place] LOWER EXTREMITY SAPHENOUS VEIN MAPPING  Result Date: 12/30/2021 LOWER EXTREMITY VEIN MAPPING Patient Name:  Jakyrie Simbeck [facility].  Date of  Exam:   12/29/2021 Medical Rec #: [id number]               Accession #:    [id number] Date of Birth: 09-21-1956               Patient Gender: M Patient Age:   58 years [facility]am [facility] Procedure:      VAS [place] LOWER EXTREMITY SAPHENOUS VEIN MAPPING Referring Phys: Jamelle Haring --------------------------------------------------------------------------------  Indications:  Pre-op Risk Factors: Hypertension, hyperlipidemia, Diabetes, past history of smoking,               coronary artery disease.  Comparison Study: No prior study Performing Technologist: Maudry Mayhew MHA, RDMS, RVT, RDCS  Examination Guidelines: A complete evaluation includes B-mode imaging, spectral Doppler, color Doppler, and power Doppler as needed of all accessible portions of each vessel. Bilateral testing is considered an integral part of a complete examination. Limited examinations for reoccurring indications may be performed as noted. +--------------+-----------+----------------------+--------------+-------------+   RT Diameter   RT Findings          GSV            LT Diameter    LT Findings         (cm)                                              (cm)                     +--------------+-----------+----------------------+--------------+-------------+       0.73                      Saphenofemoral          0.86                                                        Junction                                      +--------------+-----------+----------------------+--------------+-------------+       0.56                      Proximal thigh  0.66                     +--------------+-----------+----------------------+--------------+-------------+       0.47                        Mid thigh             0.26                     +--------------+-----------+----------------------+--------------+-------------+       0.41                       Distal thigh           0.24                      +--------------+-----------+----------------------+--------------+-------------+       0.40       branching           Knee               0.20                     +--------------+-----------+----------------------+--------------+-------------+       0.42       branching        Prox calf                            not                                                                          visualized    +--------------+-----------+----------------------+--------------+-------------+       0.35       branching         Mid calf                            not                                                                          visualized    +--------------+-----------+----------------------+--------------+-------------+       0.38                       Distal calf                           not  visualized    +--------------+-----------+----------------------+--------------+-------------+       0.40                          Ankle                              not                                                                          visualized    +--------------+-----------+----------------------+--------------+-------------+ +----------------+-----------+---------------+----------------+--------------+  RT diameter (cm) RT Findings       SSV       LT Diameter (cm)  LT Findings    +----------------+-----------+---------------+----------------+--------------+        0.29                   Popliteal fossa                  not visualized  +----------------+-----------+---------------+----------------+--------------+        0.24                    Proximal calf                   not visualized  +----------------+-----------+---------------+----------------+--------------+        0.34                      Mid calf                      not visualized  +----------------+-----------+---------------+----------------+--------------+        0.38         branching    Distal calf                    not visualized  +----------------+-----------+---------------+----------------+--------------+ Diagnosing physician: Harold Barban MD Electronically signed by Harold Barban MD on 12/30/2021 at 5:30:59 PM.    Final    ECHOCARDIOGRAM COMPLETE  Result Date: 12/31/2021    ECHOCARDIOGRAM REPORT   Patient Name:   Narek Leno. Date of Exam: 12/31/2021 Medical Rec #:  [id number]              Height:       72.0 in Accession #:    [id number]             Weight:       205.0 lb Date of Birth:  Aug 27, 1956              BSA:          2.153 m Patient Age:    68 years               BP:           160/85 mmHg Patient Gender: M                      HR:           61 bpm. Exam Location:  Inpatient Procedure: 2D Echo, Cardiac Doppler, Color Doppler and Intracardiac  Opacification Agent Indications:    Pre-operative evaluation  History:        Patient has prior history of Echocardiogram examinations, most                 recent 12/24/2018. CHF, Previous Myocardial Infarction and CAD;                 Risk Factors:Hypertension, Diabetes and Dyslipidemia. PAD. S/P                 remote PCI. Osteomyelitis.  Sonographer:    Ross Ludwig RDCS (AE) Referring Phys: [id number] Lisette Abu HILTY  Sonographer Comments: Technically difficult study due to poor echo windows, suboptimal parasternal window and suboptimal subcostal window. Image acquisition challenging due to respiratory motion. Patient had received pain medication prior to echo. Attempts to get patient to control breathing unsuccessful. IMPRESSIONS  1. Left ventricular ejection fraction, by estimation, is 60 to 65%. The left ventricle has normal function.  2. Right ventricular systolic function is normal. The right ventricular size is normal. Tricuspid regurgitation signal is inadequate for assessing PA pressure.  3. The mitral valve is grossly normal. No evidence of mitral valve regurgitation.  4. The aortic valve was not well  visualized. Aortic valve regurgitation is not visualized. No aortic stenosis is present.  5. The inferior vena cava not well visualized. Comparison(s): No significant change from prior study. FINDINGS  Left Ventricle: Windows are limited. EF appears normal. Left ventricular ejection fraction, by estimation, is 60 to 65%. The left ventricle has normal function. Definity contrast agent was given IV to delineate the left ventricular endocardial borders. The left ventricular internal cavity size was normal in size. Right Ventricle: The right ventricular size is normal. Right vetricular wall thickness was not well visualized. Right ventricular systolic function is normal. Tricuspid regurgitation signal is inadequate for assessing PA pressure. Left Atrium: Left atrial size was normal in size. Right Atrium: Right atrial size was normal in size. Pericardium: Trivial pericardial effusion is present. Mitral Valve: The mitral valve is grossly normal. No evidence of mitral valve regurgitation. MV peak gradient, 3.1 mmHg. The mean mitral valve gradient is 1.0 mmHg. Tricuspid Valve: The tricuspid valve is not well visualized. Tricuspid valve regurgitation is not demonstrated. Aortic Valve: The aortic valve was not well visualized. Aortic valve regurgitation is not visualized. No aortic stenosis is present. Aortic valve mean gradient measures 4.0 mmHg. Aortic valve peak gradient measures 7.4 mmHg. Aortic valve area, by VTI measures 2.47 cm. Pulmonic Valve: The pulmonic valve was not well visualized. Aorta: The aortic root and ascending aorta are structurally normal, with no evidence of dilitation. Venous: The inferior vena cava not well visualized. IAS/Shunts: No atrial level shunt detected by color flow Doppler.  LEFT VENTRICLE PLAX 2D LVIDd:         4.80 cm      Diastology LVIDs:         4.30 cm      LV e' medial:    6.31 cm/s LV PW:         1.50 cm      LV E/e' medial:  13.2 LV IVS:        1.50 cm      LV e' lateral:   10.10  cm/s LVOT diam:     2.30 cm      LV E/e' lateral: 8.2 LV SV:         78 LV SV Index:   36 LVOT Area:  4.15 cm  LV Volumes (MOD) LV vol d, MOD A2C: 149.0 ml LV vol d, MOD A4C: 147.0 ml LV vol s, MOD A2C: 65.6 ml LV vol s, MOD A4C: 56.2 ml LV SV MOD A2C:     83.4 ml LV SV MOD A4C:     147.0 ml LV SV MOD BP:      97.8 ml RIGHT VENTRICLE             IVC RV Basal diam:  3.40 cm     IVC diam: 2.10 cm RV S prime:     12.00 cm/s TAPSE (M-mode): 2.9 cm LEFT ATRIUM             Index        RIGHT ATRIUM           Index LA diam:        3.70 cm 1.72 cm/m   RA Area:     19.30 cm LA Vol (A2C):   58.7 ml 27.26 ml/m  RA Volume:   51.60 ml  23.97 ml/m LA Vol (A4C):   48.4 ml 22.48 ml/m LA Biplane Vol: 53.0 ml 24.62 ml/m  AORTIC VALVE AV Area (Vmax):    2.51 cm AV Area (Vmean):   2.35 cm AV Area (VTI):     2.47 cm AV Vmax:           136.00 cm/s AV Vmean:          95.600 cm/s AV VTI:            0.315 m AV Peak Grad:      7.4 mmHg AV Mean Grad:      4.0 mmHg LVOT Vmax:         82.30 cm/s LVOT Vmean:        54.000 cm/s LVOT VTI:          0.187 m LVOT/AV VTI ratio: 0.59  AORTA Ao Root diam: 3.60 cm MITRAL VALVE MV Area (PHT): 3.45 cm    SHUNTS MV Area VTI:   2.14 cm    Systemic VTI:  0.19 m MV Peak grad:  3.1 mmHg    Systemic Diam: 2.30 cm MV Mean grad:  1.0 mmHg MV Vmax:       0.88 m/s MV Vmean:      49.4 cm/s MV Decel Time: 220 msec MV E velocity: 83.30 cm/s MV A velocity: 74.40 cm/s MV E/A ratio:  1.12 Landscape architect signed by Phineas Inches Signature Date/Time: 12/31/2021/2:34:28 PM    Final     Labs:  Basic Metabolic Panel: Recent Labs  Lab 01/17/22 0542  NA 140  K 3.9  CL 98  CO2 31  GLUCOSE 172*  BUN 41*  CREATININE 1.26*  CALCIUM 8.4*    CBC: Recent Labs  Lab 01/17/22 0542  WBC 6.0  NEUTROABS 4.2  HGB 10.0*  HCT 31.6*  MCV 89.0  PLT 265    CBG: Recent Labs  Lab 01/20/22 1623 01/20/22 2104 01/21/22 0616 01/21/22 1147 01/21/22 2052  GLUCAP 129* 173* 134* 136* 282*   Family  history.  Mother with diabetes father with emphysema.  Denies any colon cancer esophageal cancer or rectal cancer  Brief HPI:   Jullien Fielding. is a 66 y.o. 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  Per chart review lives with daughter.  Mostly uses electric scooter for mobility.  Presented 12/28/2021 with ischemic right lower extremity.  Right foot films and imaging showed soft tissue wound lateral to the fifth metatarsal head with new cortical erosion on both sides of fifth metatarsal phalangeal joint indicating osteomyelitis.  Diagnostic angiography demonstrated single-vessel inline flow to the foot with significant microvascular disease from poorly controlled diabetes.  Patient underwent right sided iliofemoral endarterectomy and bovine patch 01/01/2022 per Dr. Unk Lightning as well as right side transmetatarsal amputation.  Patient remained on aspirin and Plavix as prior to admission.  Lovenox added for DVT prophylaxis.  Therapy evaluations completed due to patient decreased functional mobility was admitted for a comprehensive rehab program.   Hospital Course: [facility]. was admitted to rehab 01/09/2022 for inpatient therapies to consist of PT, ST and OT at least three hours five days a week. Past admission physiatrist, therapy team and rehab RN have worked together to provide customized collaborative inpatient rehab.  Pertaining to patient's right transmetatarsal amputation right side iliofemoral endarterectomy and bovine patch 01/01/2022.  Surgical site healing nicely patient would follow-up with vascular surgery.  He was refusing a Darco shoe.  Lovenox for DVT prophylaxis.  No bleeding episodes.  Patient remained on aspirin and Plavix as prior to admission.  Pain managed with use of Dilaudid as needed.  Blood sugars overall  controlled hemoglobin A1c 11.3 insulin therapy as directed with diabetic teaching.  Blood pressure controlled on present regimen hydralazine Coreg Norvasc as well as Avapro with close monitoring follow-up outpatient.  Lipitor for hyperlipidemia.  Patient did have a history of CAD with stenting remained on aspirin and Plavix no chest pain or shortness of breath.  He did have a remote history of tobacco use again receiving counts regards to cessation of nicotine products.  Mild left knee contracture history of BKA prosthesis which self limited use of prosthesis.  He did have some hypoxia attempts at weaning oxygen  and maintained 90% room air.  AKI closely monitoring her recent diuresis latest creatinine 1.26 he would be discharged home on 40 mg of Lasix his potassium levels remain stable without the use of potassium supplement he would follow-up with his PCP.   Blood pressures were monitored on TID basis and controlled  Diabetes has been monitored with ac/hs CBG checks and SSI was use prn for tighter BS control.    Rehab course: During patient's stay in rehab weekly team conferences were held to monitor patient's progress, set goals and discuss barriers to discharge. At admission, patient required moderate assist sit to stand minimal assist squat pivot transfers  Physical exam.  Blood pressure 148/68 pulse 76 temperature 98 to respirations 18 oxygen saturations 91% room air Constitutional.  No acute distress HEENT Head.  Normocephalic and atraumatic Eyes.  Pupils round and reactive to light no discharge without nystagmus Neck.  Supple nontender no JVD without thyromegaly Cardiac regular rate rhythm and extra sounds or murmur heard Abdomen.  Soft nontender positive bowel sounds without rebound Respiratory effort normal no respiratory distress without wheeze Musculoskeletal. Comments.  Left BKA healed no redness Right TMA and Ace wrap Lacking 30 degrees of left knee extension-contracture Upper  extremities 5/5 bilaterally Right lower extremity hip flexion knee extension knee flexion 5/5 Left lower extremity hip flexion 5/5 cannot test due to contracture Neurologic.  Alert oriented x3 follows commands  He/She  has had improvement in activity tolerance, balance, postural control as well as ability to compensate for deficits. He/She has had improvement in functional  use RUE/LUE  and RLE/LLE as well as improvement in awareness.  Modified #stand pivot to bedside commode with minimal assist.  Total assist for hygiene and standing.  He was able to don his right lower extremity prosthesis he was more encouraged to wear his Darco shoe.  He was able to complete squat pivot transfers to the wheelchair to the left with moderate assist.  Full family teaching completed plan discharged to home       Disposition: Discharged to home    Diet: Diabetic diet  Special Instructions: No driving smoking or alcohol  Medications at discharge. 1.  Tylenol as needed 2.  Norvasc 10 mg p.o. daily 3.  Aspirin 81 mg p.o. daily 4.  Lipitor 40 mg p.o. daily 5.  Coreg 25 mg p.o. twice daily 6.  Plavix 75 mg p.o. daily 7.  Voltaren gel 2 g 4 times daily 8.  Hydralazine 25 mg every 8 hours 9.  Dilaudid 2 mg p.o. every 4 hours as needed pain 10.  Semglee 24 units subcutaneous bedtime 11.  Avapro 75 mg p.o. daily 12.  Protonix 40 mg p.o. daily 13.  Senokot S1 tablet p.o. twice daily 14.Lasix 40 mg daily  30-35 minutes were spent completing discharge summary and discharge planning  Discharge Instructions     Ambulatory referral to Physical Medicine Rehab   Complete by: As directed    Moderate complexity follow-up 1 to 2 weeks right transmetatarsal amputation/history left BKA        Follow-up Information     Lovorn, Jinny Blossom, MD Follow up.   Specialty: Physical Medicine and Rehabilitation Why: As directed Contact information: [id number] [place] [address] [phone]          Broadus John, MD Follow up.   Specialty: Vascular Surgery Why: Call for appointment Contact information: [place] [address] [phone]         Minus Breeding, MD Follow up.   Specialty: Cardiology Why: Call for appointment Contact information: [address] [place] [address] [phone]         [name], PA-C Follow up on 01/24/2022.   Specialty: Family Medicine Why: Appointment @ 9:10 Am Contact information: [place] [address] [phone]                 Signed: SHIRON SPRINGER 01/22/2022, 5:18 AM

## 2022-01-22 ENCOUNTER — Other Ambulatory Visit (HOSPITAL_COMMUNITY): Payer: Self-pay

## 2022-01-22 LAB — CBC WITH DIFFERENTIAL/PLATELET
Abs Immature Granulocytes: 0.03 10*3/uL (ref 0.00–0.07)
Basophils Absolute: 0 10*3/uL (ref 0.0–0.1)
Basophils Relative: 1 %
Eosinophils Absolute: 0.2 10*3/uL (ref 0.0–0.5)
Eosinophils Relative: 4 %
HCT: 32.2 % — ABNORMAL LOW (ref 39.0–52.0)
Hemoglobin: 10.3 g/dL — ABNORMAL LOW (ref 13.0–17.0)
Immature Granulocytes: 1 %
Lymphocytes Relative: 23 %
Lymphs Abs: 1.3 10*3/uL (ref 0.7–4.0)
MCH: 28.1 pg (ref 26.0–34.0)
MCHC: 32 g/dL (ref 30.0–36.0)
MCV: 88 fL (ref 80.0–100.0)
Monocytes Absolute: 0.7 10*3/uL (ref 0.1–1.0)
Monocytes Relative: 11 %
Neutro Abs: 3.5 10*3/uL (ref 1.7–7.7)
Neutrophils Relative %: 60 %
Platelets: 282 10*3/uL (ref 150–400)
RBC: 3.66 MIL/uL — ABNORMAL LOW (ref 4.22–5.81)
RDW: 12.9 % (ref 11.5–15.5)
WBC: 5.8 10*3/uL (ref 4.0–10.5)
nRBC: 0 % (ref 0.0–0.2)

## 2022-01-22 LAB — COMPREHENSIVE METABOLIC PANEL
ALT: 12 U/L (ref 0–44)
AST: 12 U/L — ABNORMAL LOW (ref 15–41)
Albumin: 2.2 g/dL — ABNORMAL LOW (ref 3.5–5.0)
Alkaline Phosphatase: 91 U/L (ref 38–126)
Anion gap: 8 (ref 5–15)
BUN: 19 mg/dL (ref 8–23)
CO2: 35 mmol/L — ABNORMAL HIGH (ref 22–32)
Calcium: 8.1 mg/dL — ABNORMAL LOW (ref 8.9–10.3)
Chloride: 97 mmol/L — ABNORMAL LOW (ref 98–111)
Creatinine, Ser: 1.34 mg/dL — ABNORMAL HIGH (ref 0.61–1.24)
GFR, Estimated: 60 mL/min — ABNORMAL LOW (ref >60)
Glucose, Bld: 196 mg/dL — ABNORMAL HIGH (ref 70–99)
Potassium: 3.7 mmol/L (ref 3.5–5.1)
Sodium: 140 mmol/L (ref 135–145)
Total Bilirubin: 0.3 mg/dL (ref 0.3–1.2)
Total Protein: 7 g/dL (ref 6.5–8.1)

## 2022-01-22 LAB — GLUCOSE, CAPILLARY
Glucose-Capillary: 124 mg/dL — ABNORMAL HIGH (ref 70–99)
Glucose-Capillary: 177 mg/dL — ABNORMAL HIGH (ref 70–99)
Glucose-Capillary: 103 mg/dL — ABNORMAL HIGH (ref 70–99)
Glucose-Capillary: 139 mg/dL — ABNORMAL HIGH (ref 70–99)

## 2022-01-22 MED ORDER — INSULIN DETEMIR 100 UNIT/ML ~~LOC~~ SOLN
24.0000 [IU] | Freq: Every day | SUBCUTANEOUS | 11 refills | Status: AC
Start: 2022-01-22 — End: ?
  Filled 2022-01-22: qty 10, 41d supply, fill #0

## 2022-01-22 MED ORDER — AMLODIPINE BESYLATE 10 MG PO TABS
10.0000 mg | ORAL_TABLET | Freq: Every day | ORAL | 0 refills | Status: AC
Start: 2022-01-22 — End: 2022-02-21
  Filled 2022-01-22: qty 30, 30d supply, fill #0

## 2022-01-22 MED ORDER — IRBESARTAN 75 MG PO TABS
75.0000 mg | ORAL_TABLET | Freq: Every day | ORAL | 0 refills | Status: AC
  Filled 2022-01-22: qty 30, 30d supply, fill #0

## 2022-01-22 MED ORDER — PANTOPRAZOLE SODIUM 40 MG PO TBEC
40.0000 mg | DELAYED_RELEASE_TABLET | Freq: Every day | ORAL | 0 refills | Status: DC
  Filled 2022-01-22: qty 30, 30d supply, fill #0

## 2022-01-22 MED ORDER — HYDROMORPHONE HCL 2 MG PO TABS
2.0000 mg | ORAL_TABLET | ORAL | 0 refills | Status: DC | PRN
  Filled 2022-01-22: qty 30, 5d supply, fill #0

## 2022-01-22 MED ORDER — "INSULIN SYRINGE-NEEDLE U-100 30G X 1/2"" 0.5 ML MISC"
0 refills | Status: DC
  Filled 2022-01-22: qty 40, 40d supply, fill #0

## 2022-01-22 MED ORDER — DICLOFENAC SODIUM 1 % EX GEL
2.0000 g | Freq: Four times a day (QID) | CUTANEOUS | 0 refills | Status: DC
  Filled 2022-01-22: qty 200, 30d supply, fill #0

## 2022-01-22 MED ORDER — CLOPIDOGREL BISULFATE 75 MG PO TABS
75.0000 mg | ORAL_TABLET | Freq: Every day | ORAL | 0 refills | Status: AC
Start: 2022-01-22 — End: 2022-02-21
  Filled 2022-01-22: qty 30, 30d supply, fill #0

## 2022-01-22 MED ORDER — SENNOSIDES-DOCUSATE SODIUM 8.6-50 MG PO TABS: 1.0000 | ORAL_TABLET | Freq: Two times a day (BID) | ORAL | Status: DC

## 2022-01-22 MED ORDER — ATORVASTATIN CALCIUM 40 MG PO TABS
40.0000 mg | ORAL_TABLET | Freq: Every day | ORAL | 0 refills | Status: DC
  Filled 2022-01-22: qty 30, 30d supply, fill #0

## 2022-01-22 MED ORDER — ACETAMINOPHEN 325 MG PO TABS: 325.0000 mg | ORAL_TABLET | ORAL | Status: DC | PRN

## 2022-01-22 MED ORDER — CARVEDILOL 25 MG PO TABS
25.0000 mg | ORAL_TABLET | Freq: Two times a day (BID) | ORAL | 0 refills | Status: AC
  Filled 2022-01-22: qty 60, 30d supply, fill #0

## 2022-01-22 MED ORDER — HYDRALAZINE HCL 25 MG PO TABS
25.0000 mg | ORAL_TABLET | Freq: Three times a day (TID) | ORAL | 0 refills | Status: AC
  Filled 2022-01-22: qty 90, 30d supply, fill #0

## 2022-01-22 NOTE — Progress Notes (Signed)
PROGRESS NOTE   Subjective/Complaints:   Pt reports no issues- says he's drinking, but Cr is rising slightly.  Thinks still needing pain meds frequently.    ROS:  Pt denies SOB, abd pain, CP, N/V/C/D, and vision changes       Objective:   No results found. Recent Labs    01/22/22 0745  WBC 5.8  HGB 10.3*  HCT 32.2*  PLT 282   Recent Labs    01/22/22 0745  NA 140  K 3.7  CL 97*  CO2 35*  GLUCOSE 196*  BUN 19  CREATININE 1.34*  CALCIUM 8.1*    Intake/Output Summary (Last 24 hours) at 01/22/2022 4492 Last data filed at 01/22/2022 0726 Gross per 24 hour  Intake 358 ml  Output 1400 ml  Net -1042 ml        Physical Exam: Vital Signs Blood pressure (!) 149/58, pulse 63, temperature 98.1 F (36.7 C), temperature source Oral, resp. rate 18, height 6' (1.829 m), weight 93 kg, SpO2 90 %.    General: awake, alert, appropriate, sitting EOBl; lab getting blood; wearing O2 by [place] 1L; NAD HENT: conjugate gaze; oropharynx moist CV: regular rate; no JVD Pulmonary: CTA B/L; no W/R/R- decreased at bases B/L wearing 1L O2 by [place] GI: soft, NT, ND, (+)BS Psychiatric: appropriate- irritable - chronic Neurological: Ox3  Genitourinary: Scrotal edema appears to be improving Musculoskeletal:  L BKA healed well- no redness R TMA- in ACE wrap Skin: Right TMA dressing in place.  Appropriately tender- covered with ACE wrap Right inguinal rash appears to be improving  Assessment/Plan: 1. Functional deficits which require 3+ hours per day of interdisciplinary therapy in a comprehensive inpatient rehab setting. Physiatrist is providing close team supervision and 24 hour management of active medical problems listed below. Physiatrist and rehab team continue to assess barriers to discharge/monitor patient progress toward functional and medical goals  Care Tool:  Bathing  Bathing activity did not occur: Refused Body  parts bathed by patient: Right arm, Left arm, Chest, Abdomen, Front perineal area, Buttocks, Right upper leg, Left upper leg, Face   Body parts bathed by helper: Right lower leg Body parts n/a: Left lower leg, Right lower leg   Bathing assist Assist Level: Supervision/Verbal cueing     Upper Body Dressing/Undressing Upper body dressing Upper body dressing/undressing activity did not occur (including orthotics): Refused What is the patient wearing?: Pull over shirt    Upper body assist Assist Level: Set up assist    Lower Body Dressing/Undressing Lower body dressing    Lower body dressing activity did not occur: Refused What is the patient wearing?: Pants     Lower body assist Assist for lower body dressing: Set up assist     Toileting Toileting Toileting Activity did not occur (Clothing management and hygiene only): Refused  Toileting assist Assist for toileting: Independent with assistive device Assistive Device Comment: urinal   Transfers Chair/bed transfer  Transfers assist  Chair/bed transfer activity did not occur: Safety/medical concerns  Chair/bed transfer assist level: Moderate Assistance - Patient 50 - 74%     Locomotion Ambulation   Ambulation assist   Ambulation activity did not occur: Safety/medical concerns  Walk 10 feet activity   Assist  Walk 10 feet activity did not occur: Safety/medical concerns        Walk 50 feet activity   Assist Walk 50 feet with 2 turns activity did not occur: Safety/medical concerns         Walk 150 feet activity   Assist Walk 150 feet activity did not occur: Safety/medical concerns         Walk 10 feet on uneven surface  activity   Assist Walk 10 feet on uneven surfaces activity did not occur: Safety/medical concerns         Wheelchair     Assist Is the patient using a wheelchair?: Yes Type of Wheelchair: Manual    Wheelchair assist level: Supervision/Verbal cueing Max  wheelchair distance: 100 ft    Wheelchair 50 feet with 2 turns activity    Assist        Assist Level: Supervision/Verbal cueing   Wheelchair 150 feet activity     Assist      Assist Level: Moderate Assistance - Patient 50 - 74%   Blood pressure (!) 149/58, pulse 63, temperature 98.1 F (36.7 C), temperature source Oral, resp. rate 18, height 6' (1.829 m), weight 93 kg, SpO2 90 %.  Medical Problem List and Plan: 1. 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  Note specifies right TMA dressing to remain in place until 2/13 as well as history of left BKA 2019 Con't CIR_ PT, and OT- d/c tomorrow-  2.  Antithrombotics: -DVT/anticoagulation:  Pharmaceutical: Lovenox             -antiplatelet therapy: Aspirin 81 mg daily and Plavix 75 mg daily 3. Pain Management: Dilaudid 4 mg every 4 hours as needed  2/22- will need to f/u with surgeon to get pain meds at d/c- since they handle TMA- I don't need to see for rehab needs  2/27 con't pain regimen, however will need to f/u with surgeon for pain meds; not rehab- will not need PM&R f/u after d/c. Still taking pain meds frequently- will give 7 days after d/c, then needs to get from PCP.  4. Mood: Provide emotional support             -antipsychotic agents: N/A 5. Neuropsych: This patient is capable of making decisions on his own behalf. 6. Skin/Wound Care: Routine skin checks 7. Fluids/Electrolytes/Nutrition: Routine in and outs  8.  Uncontrolled diabetes mellitus with hyperglycemia.  Hemoglobin A1c 11.3.  Semglee 24 units daily.  Check blood sugars before meals and at bedtime  CBG (last 3)  Recent Labs    01/21/22 1147 01/21/22 2052 01/22/22 0535  GLUCAP 136* 282* 124*   2/21- CBGs controlled- con't regimen- educated on insulin and reminded how to take care of self DM- wise  2/24- CBGs somewhat more labile today- however hasn't been a trend,  so will maintain DM regimen for today  2/25-slightly labile, continue to monitor for trend  2/27- CBGs doing OK except for 1x evening- due to snacks- con't reigmen 9.  Hypertension.  Hydralazine 25 mg every 8 hours, Coreg 25 mg twice daily, Norvasc 10 mg daily, Avapro 75 mg daily.  Monitor with increased mobility  2/27- BP controlled- con't regimen Vitals:   01/21/22 2000 01/22/22 0420  BP: (!) 135/56 (!) 149/58  Pulse: 66 63  Resp: 18 18  Temp: 98.2 F (36.8 C) 98.1 F (36.7 C)  SpO2: 90% 90%  10.  Hyperlipidemia.  Lipitor 11.  CAD with stenting.  Continue aspirin and Plavix 12.  History of tobacco use.  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  13. L knee contracture- has BKA prosthesis- don't see a way to fix-  14. Scrotal swelling- since iliofemoral endarterectomy- will elevate.   Improving 15. New O2 requirement- will see if can wean off- has COPD  2/16- off O2  2/20- back on O2 as below #18  2/23- cannot wean below 2-3L at a time- CXR looks OK- likely COPD based on CXR and Sx's  2/27- down to 1L O2- weaning off slowly. But still requiring with exercise up to 3L 16. RUE PICC- will see if able to get out?  2/16- will order for removal 17. Loose stools  2/16- will make colace prn and change senna to 1 tab BID- and have him hold bowel meds today  2/17- doing better- con't regimen 18.  Hypoxia due to fluid overload , ECHO `2wks ago looked nl although some technical limitations , responding to Lasix went from 6L to 4.5L O2, will increase IV Lasix to BID   2/20- willa dd flutter valve due to low lung volumes and ask nursing to give albuterol prn nebs.  2/21- Pt down to 3L O2- will con't flutter valve  2/24- asked pt to continue using flutter valve- don't think he's using since not on bedside table this AM 19. AKI  2/20- Cr risen from 1.54 to 1.8- due to increase in Lasix. However O2 requirement is less- will monitor closely.   Creatinine 1.26 on 2/22, continue to monitor  2/27- Cr 1.34 this  AM- drinking a lot of caffeine-       LOS: 13 days A FACE TO FACE EVALUATION WAS PERFORMED  Sherene Plancarte 01/22/2022, 9:37 AM

## 2022-01-22 NOTE — Progress Notes (Signed)
Occupational Therapy Session Note  Patient Details  Name: Antonio [name]. MRN: [id number] Date of Birth: Feb 11, 1956  Today's Date: 01/22/2022 OT Individual Time: 8841-6606 OT Individual Time Calculation (min): 69 min    Short Term Goals: Week 1:  OT Short Term Goal 1 (Week 1): Pt will complete LB dressing via lateral leans with supervision OT Short Term Goal 1 - Progress (Week 1): Progressing toward goal OT Short Term Goal 2 (Week 1): Pt will complete sit > stand in prep for ADL with Max A OT Short Term Goal 2 - Progress (Week 1): Progressing toward goal OT Short Term Goal 3 (Week 1): Pt will complete pericare with CGA for increased independence with toileting self-care OT Short Term Goal 3 - Progress (Week 1): Progressing toward goal Week 2:  OT Short Term Goal 1 (Week 2): STG = LTG 2/2 LOS   Skilled Therapeutic Interventions/Progress Updates:    Pt greeted at time of session sitting up in wheelchair agreeable to OT session, stating he did sink level ADL this AM and did not want to take a shower. Pt aware of grad day and going home tomorrow, agreeable to work on transfers to various ADL surfaces. Pt transported to bathroom and performed squat pivot wheelchair <> toilet and wheelchair <> TTB in shower all with Supervision. Discussion throughout session regarding DC home, adaptive techniques, DME needs (pt already has all that he needs per the pt). Remainder of session focused on BUE HEP with red level theraband with demonstrating one set of each exercise with good carryover. Pt sats at 90% or higher throughout session on room air. Pt set up in wheelchair alarm on call bell in reach.     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, pt able Restrictions Weight Bearing Restrictions: Yes RLE Weight Bearing: Partial weight bearing RLE Partial Weight Bearing Percentage or Pounds: heel Other  Position/Activity Restrictions: Per vascular sx note     Therapy/Group: Individual Therapy  Erasmo Score 01/22/2022, 7:30 AM

## 2022-01-22 NOTE — Progress Notes (Signed)
Patient ID: Antonio [name]., male   DOB: Nov 24, 1956, 66 y.o.   MRN: [id number]  Met with pt to discuss can not get home health due to his medicaid but he can go to OP. He feels this is not needed and he is doing well on his own. Discussed if he changes his mind can still make OP referral. Pt has all equipment and is aware of not qualifying for home O2 feels he doesn't need it anyway. Ready for discharge tomorrow

## 2022-01-22 NOTE — Progress Notes (Signed)
Inpatient Rehabilitation Discharge Medication Review by a Pharmacist  A complete drug regimen review was completed for this patient to identify any potential clinically significant medication issues.  High Risk Drug Classes Is patient taking? Indication by Medication  Antipsychotic No   Anticoagulant No   Antibiotic No   Opioid Yes Hydromorphone- acute pain  Antiplatelet Yes Aspirin, Plavix- PAD, preservation of stents  Hypoglycemics/insulin Yes Levemir- T2DM  Vasoactive Medication Yes Coreg, norvasc, hydralazine, avapro- hypertension, rate control  Chemotherapy No   Other Yes Lipitor- HLD Protonix- GERD     Type of Medication Issue Identified Description of Issue Recommendation(s)  Drug Interaction(s) (clinically significant)     Duplicate Therapy     Allergy     No Medication Administration End Date     Incorrect Dose     Additional Drug Therapy Needed     Significant med changes from prior encounter (inform family/care partners about these prior to discharge).    Other       Clinically significant medication issues were identified that warrant physician communication and completion of prescribed/recommended actions by midnight of the next day:  No  Time spent performing this drug regimen review (minutes):  30   Rithy Mandley BS, PharmD, BCPS Clinical Pharmacist 01/22/2022 10:36 AM

## 2022-01-22 NOTE — Progress Notes (Signed)
Physical Therapy Discharge Summary  Patient Details  Name: Antonio [name]. MRN: [id number] Date of Birth: [birth year]  Today's Date: 01/22/2022 PT Individual Time: 0905-0958 PT Individual Time Calculation (min): 53 min    Patient has met 3 of 4 long term goals due to improved activity tolerance, improved balance, improved postural control, increased strength, decreased pain, and ability to compensate for deficits.  Patient to discharge at a wheelchair level Modified Independent.   Patient's care partner is independent to provide the necessary physical assistance at discharge. Pt performs modified stand pivot transfers mod I and propels a w/c mod I, will be using power chair at home. Pt resistant to using RW d/t previous falls. No family education performed d/t availability.   Reasons goals not met: Pt did not progress to therapeutic gait d/t ROM restrictions and limited participation with therapy.   Recommendation:  Patient will benefit from ongoing skilled PT services in home health setting to continue to advance safe functional mobility, address ongoing impairments in strength, balance, ROM, and minimize fall risk.  Equipment: No equipment provided  Reasons for discharge: treatment goals met and discharge from hospital  Patient/family agrees with progress made and goals achieved: Yes  Skilled Therapeutic Interventions/Progress Updates:  Pt recd EOB with RN present to administer IV medication. Pt reports pain controlled on medication at this time. Stand pivot transfer w/c<>EOB mod I, pt using self selected technique which he used PTA. Pt propelled w/c with BUE  prior to needing to terminated d/t placement of IV on forearm, pt then transported to gym. Pt performed car transfer into both sides of car with supervision and VC for technique. Motor and sensory testing performed as documented below. Pt remained in w/c and was left with all needs in reach and alarm active after addressing all  pt questions about d/c as able.   PT Discharge Precautions/Restrictions Precautions Precaution Comments: s/p R TMA; prothesis for L prior BKA in room, pt able to don independently Restrictions Weight Bearing Restrictions: Yes RLE Weight Bearing: Partial weight bearing RLE Partial Weight Bearing Percentage or Pounds: heel Vital Signs   Pain Interference Pain Interference Pain Effect on Sleep: 1. Rarely or not at all Pain Interference with Therapy Activities: 1. Rarely or not at all Pain Interference with Day-to-Day Activities: 1. Rarely or not at all Vision/Perception  Vision - History Ability to See in Adequate Light: 0 Adequate Perception Perception: Within Functional Limits Praxis Praxis: Intact  Cognition Overall Cognitive Status: No family/caregiver present to determine baseline cognitive functioning Arousal/Alertness: Awake/alert Orientation Level: Oriented X4 Year: 2023 Month: February Day of Week: Correct Memory: Appears intact Safety/Judgment: Appears intact Sensation Sensation Light Touch: Impaired by gross assessment Proprioception: Appears Intact Stereognosis: Not tested Coordination Gross Motor Movements are Fluid and Coordinated: No Fine Motor Movements are Fluid and Coordinated: No Coordination and Movement Description: grossly uncooridinated due to WB status, decreased strength and balance Motor  Motor Motor: Within Functional Limits Motor - Skilled Clinical Observations: debilitated from long hospital course  Mobility Bed Mobility Bed Mobility: Sit to Supine;Supine to Sit Supine to Sit: Independent with assistive device Sit to Supine: Independent with assistive device Transfers Transfers: Squat Pivot Transfers;Stand Pivot Transfers Stand Pivot Transfers: Supervision/Verbal cueing Stand Pivot Transfer Details: Verbal cues for safe use of DME/AE Stand Pivot Transfer Details (indicate cue type and reason): modified stand/squat pivot, what pt did at  home Squat Pivot Transfers: Independent with assistive device Transfer (Assistive device): None Locomotion  Gait Ambulation: No Gait Gait: No  Stairs / Additional Locomotion Stairs: No Pick up small object from the floor (from standing position) activity did not occur: Safety/medical concerns Architect: Yes Wheelchair Assistance: Independent with Camera operator: Both upper extremities Wheelchair Parts Management: Needs assistance (pt's home chair does not have these features) Distance: 200  Trunk/Postural Assessment  Cervical Assessment Cervical Assessment: Within Functional Limits Thoracic Assessment Thoracic Assessment: Within Functional Limits Lumbar Assessment Lumbar Assessment: Within Functional Limits Postural Control Postural Control: Within Functional Limits  Balance Balance Balance Assessed: Yes Static Sitting Balance Static Sitting - Balance Support: No upper extremity supported Static Sitting - Level of Assistance: 6: Modified independent (Device/Increase time) Dynamic Sitting Balance Dynamic Sitting - Balance Support: No upper extremity supported Dynamic Sitting - Level of Assistance: 6: Modified independent (Device/Increase time) Extremity Assessment      RLE Assessment RLE Assessment: Exceptions to [facility] Passive Range of Motion (PROM) Comments: lacking ~10 deg knee extension RLE Strength Right Hip Flexion: 4-/5 Right Knee Flexion: 4-/5 Right Knee Extension: 4-/5 LLE Assessment LLE Assessment: Exceptions to [facility] Passive Range of Motion (PROM) Comments: lacking ~10 deg knee extension LLE Strength Left Hip Flexion: 4-/5 Left Knee Flexion: 4+/5 Left Knee Extension: 4+/5    Soraiya Ahner C Henriette Hesser 01/22/2022, 9:56 AM

## 2022-01-22 NOTE — Progress Notes (Signed)
Inpatient Rehabilitation Care Coordinator Discharge Note   Patient Details  Name: Antonio [name]. MRN: [id number] Date of Birth: 07-Jan-1956   Discharge location: HOME TO MOMS' HOME DAUGHTER LIVES WITH AND CAN ASSIST IF NEEDED  Length of Stay: 14 DAYS  Discharge activity level: INDEPENDENT WHEELCHAIR LEVEL & MIN ASSIST WITH AMBULATION  Home/community participation: ACTIVE  Patient response UG:QBVQXI Literacy - How often do you need to have someone help you when you read instructions, pamphlets, or other written material from your doctor or pharmacy?: Never  Patient response HW:TUUEKC Isolation - How often do you feel lonely or isolated from those around you?: Never  Services provided included: MD, RD, PT, OT, RN, CM, TR, Pharmacy, SW  Financial Services:  Financial Services Utilized: Medicaid    Choices offered to/list presented to: PT  Follow-up services arranged:  Other (Comment) (OFFERED OUTPATIENT PT AND PT FELT NOT NEEDED)   HAS ALL EQUIPMENT FROM PREVIOUS ADMISSIONS.         Patient response to transportation need: Is the patient able to respond to transportation needs?: Yes In the past 12 months, has lack of transportation kept you from medical appointments or from getting medications?: No In the past 12 months, has lack of transportation kept you from meetings, work, or from getting things needed for daily living?: No    Comments (or additional information): PT FELT DID WELL AND REACHED THE GOALS HE NEEDED TO REACH TO GO HOME. HE WILL HAVE ASSIST IF HE NEEDS IT AND HAS NO CONCERNS REGARDING GOING HOME.  Patient/Family verbalized understanding of follow-up arrangements:  Yes  Individual responsible for coordination of the follow-up plan: BAILEY-DAUGHTER [phone]  Confirmed correct DME delivered: Lucy Chris 01/22/2022    Taylen Wendland, Lemar Livings

## 2022-01-23 ENCOUNTER — Other Ambulatory Visit (HOSPITAL_COMMUNITY): Payer: Self-pay

## 2022-01-23 LAB — GLUCOSE, CAPILLARY: Glucose-Capillary: 132 mg/dL — ABNORMAL HIGH (ref 70–99)

## 2022-01-23 MED ORDER — "INSULIN SYRINGE-NEEDLE U-100 30G X 1/2"" 0.5 ML MISC"
0 refills | Status: DC
  Filled 2022-01-23: qty 40, fill #0

## 2022-01-23 MED ORDER — FUROSEMIDE 40 MG PO TABS
40.0000 mg | ORAL_TABLET | Freq: Every day | ORAL | 0 refills | Status: AC
  Filled 2022-01-23: qty 30, 30d supply, fill #0

## 2022-01-23 NOTE — Progress Notes (Signed)
PROGRESS NOTE   Subjective/Complaints:   Off O2-  Has still been on IV Lasix 40 mg BID- will change to Lasix 40 mg PO Daily for d/c; hasn't required KCl on IV Lasix, so will not send home on potassium since hasn't required.   Pt ready for d/c- explained will get 7 days of meds, then need to f/u with surgeon for more if needed.   Went over with PA- will need f/u with PCP this week to check labs and breathing issues.   ROS:  Pt denies SOB, abd pain, CP, N/V/C/D, and vision changes     Objective:   No results found. Recent Labs    01/22/22 0745  WBC 5.8  HGB 10.3*  HCT 32.2*  PLT 282   Recent Labs    01/22/22 0745  NA 140  K 3.7  CL 97*  CO2 35*  GLUCOSE 196*  BUN 19  CREATININE 1.34*  CALCIUM 8.1*    Intake/Output Summary (Last 24 hours) at 01/23/2022 0815 Last data filed at 01/23/2022 0700 Gross per 24 hour  Intake 297 ml  Output 850 ml  Net -553 ml        Physical Exam: Vital Signs Blood pressure (!) 141/64, pulse 60, temperature 98.3 F (36.8 C), temperature source Oral, resp. rate 18, height 6' (1.829 m), weight 93 kg, SpO2 92 %.     General: awake, alert, appropriate, sitting EOB; NAD HENT: conjugate gaze; oropharynx dry CV: regular rate; no JVD Pulmonary: CTA B/L; no W/R/R- decreased at bases- off O2 GI: soft, NT, ND, (+)BS Psychiatric: appropriate- but flat Neurological: Ox3   Genitourinary: Scrotal edema appears to be improving Musculoskeletal:  L BKA healed well- no redness R TMA- in ACE wrap Skin: Right TMA dressing in place.  Appropriately tender- covered with ACE wrap Right inguinal rash appears to be improving  Assessment/Plan: 1. Functional deficits which require 3+ hours per day of interdisciplinary therapy in a comprehensive inpatient rehab setting. Physiatrist is providing close team supervision and 24 hour management of active medical problems listed  below. Physiatrist and rehab team continue to assess barriers to discharge/monitor patient progress toward functional and medical goals  Care Tool:  Bathing  Bathing activity did not occur: Refused Body parts bathed by patient: Right arm, Left arm, Chest, Abdomen, Front perineal area, Buttocks, Right upper leg, Left upper leg, Face   Body parts bathed by helper: Right lower leg Body parts n/a: Left lower leg, Right lower leg   Bathing assist Assist Level: Supervision/Verbal cueing (per most recent OT documentation)     Upper Body Dressing/Undressing Upper body dressing Upper body dressing/undressing activity did not occur (including orthotics): Refused What is the patient wearing?: Pull over shirt    Upper body assist Assist Level: Set up assist    Lower Body Dressing/Undressing Lower body dressing    Lower body dressing activity did not occur: Refused What is the patient wearing?: Pants     Lower body assist Assist for lower body dressing: Set up assist     Toileting Toileting Toileting Activity did not occur (Clothing management and hygiene only): Refused  Toileting assist Assist for toileting: Supervision/Verbal cueing (simulated) Assistive  Device Comment: urinal   Transfers Chair/bed transfer  Transfers assist  Chair/bed transfer activity did not occur: Safety/medical concerns  Chair/bed transfer assist level: Independent with assistive device (modified stand/squat pivot) Chair/bed transfer assistive device: Armrests   Locomotion Ambulation   Ambulation assist   Ambulation activity did not occur: Safety/medical concerns          Walk 10 feet activity   Assist  Walk 10 feet activity did not occur: Safety/medical concerns        Walk 50 feet activity   Assist Walk 50 feet with 2 turns activity did not occur: Safety/medical concerns         Walk 150 feet activity   Assist Walk 150 feet activity did not occur: Safety/medical concerns          Walk 10 feet on uneven surface  activity   Assist Walk 10 feet on uneven surfaces activity did not occur: Safety/medical concerns         Wheelchair     Assist Is the patient using a wheelchair?: Yes Type of Wheelchair: Manual    Wheelchair assist level: Independent Max wheelchair distance: 175 ft    Wheelchair 50 feet with 2 turns activity    Assist        Assist Level: Independent   Wheelchair 150 feet activity     Assist      Assist Level: Independent   Blood pressure (!) 141/64, pulse 60, temperature 98.3 F (36.8 C), temperature source Oral, resp. rate 18, height 6' (1.829 m), weight 93 kg, SpO2 92 %.  Medical Problem List and Plan: 1. 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  Note specifies right TMA dressing to remain in place until 2/13 as well as history of left BKA 2019 Con't CIR_ PT, and OT- d/c tomorrow- 2/28- d/c today- will need PCP appt in next week to check labs (K+) and f/u on the need for Lasix and breathing issues; also needs to f/u with surgeon as ordered- doesn't need PM&R f/u.   2.  Antithrombotics: -DVT/anticoagulation:  Pharmaceutical: Lovenox             -antiplatelet therapy: Aspirin 81 mg daily and Plavix 75 mg daily 3. Pain Management: Dilaudid 4 mg every 4 hours as needed  2/22- will need to f/u with surgeon to get pain meds at d/c- since they handle TMA- I don't need to see for rehab needs  2/27 con't pain regimen, however will need to f/u with surgeon for pain meds; not rehab- will not need PM&R f/u after d/c. Still taking pain meds frequently- will give 7 days after d/c, then needs to get from PCP.  4. Mood: Provide emotional support             -antipsychotic agents: N/A 5. Neuropsych: This patient is capable of making decisions on his own behalf. 6. Skin/Wound Care: Routine skin checks 7. Fluids/Electrolytes/Nutrition:  Routine in and outs  8.  Uncontrolled diabetes mellitus with hyperglycemia.  Hemoglobin A1c 11.3.  Semglee 24 units daily.  Check blood sugars before meals and at bedtime  CBG (last 3)  Recent Labs    01/22/22 1713 01/22/22 2057 01/23/22 0544  GLUCAP 103* 139* 132*   2/21- CBGs controlled- con't regimen- educated on insulin and reminded how to take care of self DM- wise  2/24- CBGs somewhat more labile today- however hasn't been a trend, so will maintain DM regimen for today  2/25-slightly  labile, continue to monitor for trend  2/27- CBGs doing OK except for 1x evening- due to snacks- con't regimen  2/28- will send home on current regimen- pt was noncompliant prior.  9.  Hypertension.  Hydralazine 25 mg every 8 hours, Coreg 25 mg twice daily, Norvasc 10 mg daily, Avapro 75 mg daily.  Monitor with increased mobility  2/27- BP controlled- con't regimen Vitals:   01/22/22 2100 01/23/22 0332  BP: (!) 142/61 (!) 141/64  Pulse: (!) 58 60  Resp: 18 18  Temp: 98.1 F (36.7 C) 98.3 F (36.8 C)  SpO2: 90% 92%    10.  Hyperlipidemia.  Lipitor 11.  CAD with stenting.  Continue aspirin and Plavix 12.  History of tobacco use.  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  13. L knee contracture- has BKA prosthesis- don't see a way to fix-  14. Scrotal swelling- since iliofemoral endarterectomy- will elevate.   Improving 15. New O2 requirement- will see if can wean off- has COPD  2/16- off O2  2/20- back on O2 as below #18  2/23- cannot wean below 2-3L at a time- CXR looks OK- likely COPD based on CXR and Sx's  2/27- down to 1L O2- weaning off slowly. But still requiring with exercise up to 3L  2/28- off O2 this AM-  16. RUE PICC- will see if able to get out?  2/16- will order for removal 17. Loose stools  2/16- will make colace prn and change senna to 1 tab BID- and have him hold bowel meds today  2/17- doing better- con't regimen 18.  Hypoxia due to fluid overload , ECHO `2wks ago looked nl although some  technical limitations , responding to Lasix went from 6L to 4.5L O2, will increase IV Lasix to BID   2/20- willa dd flutter valve due to low lung volumes and ask nursing to give albuterol prn nebs.  2/21- Pt down to 3L O2- will con't flutter valve  2/24- asked pt to continue using flutter valve- don't think he's using since not on bedside table this AM  2/28- will switch Lasix from Lasix IV 40 mg BID to Lasix 40 mg daily PO for d/c- hasn't required any KCL so won't send home on it.  19. AKI  2/20- Cr risen from 1.54 to 1.8- due to increase in Lasix. However O2 requirement is less- will monitor closely.   Creatinine 1.26 on 2/22, continue to monitor  2/27- Cr 1.34 this AM- drinking a lot of caffeine-    I spent a total of  32  minutes on total care today- >50% coordination of care- due to discussion with pt and PA about d/c plans.     LOS: 14 days A FACE TO FACE EVALUATION WAS PERFORMED  Cheryn Lundquist 01/23/2022, 8:15 AM

## 2022-01-24 ENCOUNTER — Inpatient Hospital Stay: Payer: Medicaid Other | Admitting: Physician Assistant

## 2022-01-24 ENCOUNTER — Telehealth (HOSPITAL_COMMUNITY): Payer: Self-pay

## 2022-01-24 ENCOUNTER — Other Ambulatory Visit (HOSPITAL_COMMUNITY): Payer: Self-pay

## 2022-01-24 NOTE — Progress Notes (Deleted)
Patient ID: Antonio Richards., male   DOB: [birth year], 66 y.o.   MRN: [id number] ? ?After hospitalization 2/2-2/14/2023 then inpatient rehab from 2/14-2/28. ? ?Hospital discharge summary: ?Discharge Diagnosis: ?1. 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 Severe PAD s/p Right iliofemoral endarterectomy ?3. Hypertension ?4. Type 2 diabetes with complication ? ?Hospital Course by problem list: ?1. Pt presented with Right fifth metatarsal wound and was found to have osteomyelitis. ABIs of the bilateral lower extremities were obtained and significant calcifications noted bilaterally. Orthopedics and vascular were consulted and patient underwent right 5th metatarsal amputation and right iliofemoral endarterectomy. Procedure was successful. Infectious disease was consulted and No abx indicated. Pt well controlled on oral diluadid and tylenol. Post-op patient required new O2 requirement, needing 2L [place] to maintain >95%. CXR obtained revealing atelectasis. Pt remained afebrile and no leukocytosis to indicate infection. Due to immobilization and pt not utilizing spirometry at bedside, resultant atelectasis occurred. Pt was encouraged to work with PT/OT and spirometry for resolution. PT/OT recommended CIR. During hospitalization, patient was hypertensive SBP 170-160s, asymptomatic. Patient was restarted on home medications and ARB was added. The medications includes: amlodipine 10 mg daily, hydralazine 25 mg 3 times daily, carvedilol 25 twice daily, irbesartan 75 mg daily. Patients BP stabilized on this regimen. Patient glucose levels fluctuated throughout hospitalization, mostly in the 200s fasting, His long acting Semglee was titrated up, currently at 24 units at bedtime which adequately controlled morning fasting. Pt medically stable and ready for CIR transfer. ? ? ?From inpatient rehab summary: ?Family history.  Mother with diabetes father with emphysema.  Denies any colon  cancer esophageal cancer or rectal cancer ?  ?Brief HPI:   Antonio Senn. is a 66 y.o. 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  Per chart review lives with daughter.  Mostly uses electric scooter for mobility.  Presented 12/28/2021 with ischemic right lower extremity.  Right foot films and imaging showed soft tissue wound lateral to the fifth metatarsal head with new cortical erosion on both sides of fifth metatarsal phalangeal joint indicating osteomyelitis.  Diagnostic angiography demonstrated single-vessel inline flow to the foot with significant microvascular disease from poorly controlled diabetes.  Patient underwent right sided iliofemoral endarterectomy and bovine patch 01/01/2022 per Dr. Sherral Hammers as well as right side transmetatarsal amputation.  Patient remained on aspirin and Plavix as prior to admission.  Lovenox added for DVT prophylaxis.  Therapy evaluations completed due to patient decreased functional mobility was admitted for a comprehensive rehab program. ?  ?  ?Hospital Course: Antonio [name]. was admitted to rehab 01/09/2022 for inpatient therapies to consist of PT, ST and OT at least three hours five days a week. Past admission physiatrist, therapy team and rehab RN have worked together to provide customized collaborative inpatient rehab.  Pertaining to patient's right transmetatarsal amputation right side iliofemoral endarterectomy and bovine patch 01/01/2022.  Surgical site healing nicely patient would follow-up with vascular surgery.  He was refusing a Darco shoe.  Lovenox for DVT prophylaxis.  No bleeding episodes.  Patient remained on aspirin and Plavix as prior to admission.  Pain managed with use of Dilaudid as needed.  Blood sugars overall controlled hemoglobin A1c 11.3 insulin therapy as directed with diabetic  teaching.  Blood pressure controlled on present regimen hydralazine Coreg Norvasc as well as  Avapro with close monitoring follow-up outpatient.  Lipitor for hyperlipidemia.  Patient did have a history of CAD with stenting remained on aspirin and Plavix no chest pain or shortness of breath.  He did have a remote history of tobacco use again receiving counts regards to cessation of nicotine products.  Mild left knee contracture history of BKA prosthesis which self limited use of prosthesis.  He did have some hypoxia attempts at weaning oxygen  and maintained 90% room air.  AKI closely monitoring her recent diuresis latest creatinine 1.26 he would be discharged home on 40 mg of Lasix his potassium levels remain stable without the use of potassium supplement he would follow-up with his PCP. ?  ?  ?Blood pressures were monitored on TID basis and controlled ?  ?Diabetes has been monitored with ac/hs CBG checks and SSI was use prn for tighter BS control.  ?

## 2022-01-24 NOTE — Telephone Encounter (Signed)
Transitions of Care Pharmacy  ° °Call attempted for a pharmacy transitions of care follow-up.  ° °Call attempt #1. Will follow-up in 2-3 days.  °  °

## 2022-01-25 ENCOUNTER — Other Ambulatory Visit (HOSPITAL_COMMUNITY): Payer: Self-pay

## 2022-01-25 ENCOUNTER — Telehealth: Payer: Self-pay | Admitting: *Deleted

## 2022-01-25 ENCOUNTER — Telehealth (HOSPITAL_COMMUNITY): Payer: Self-pay

## 2022-01-25 NOTE — Telephone Encounter (Signed)
Transition Care Management Unsuccessful Follow-up Telephone Call ? ?Date of discharge and from where:  01/23/22 [facility] inpt rehab ? ?Attempts:  1st Attempt ? ?Reason for unsuccessful TCM follow-up call:  Unable to leave message ? ?  ?

## 2022-01-25 NOTE — Telephone Encounter (Signed)
Transitions of Care Pharmacy   Call attempted for a pharmacy transitions of care follow-up. HIPAA appropriate voicemail was left with call back information provided.   Call attempt #2. Will follow-up in 2-3 days.    

## 2022-01-26 ENCOUNTER — Telehealth (HOSPITAL_COMMUNITY): Payer: Self-pay

## 2022-01-26 ENCOUNTER — Other Ambulatory Visit (HOSPITAL_COMMUNITY): Payer: Self-pay

## 2022-01-26 NOTE — Telephone Encounter (Signed)
Transitions of Care Pharmacy  ? ?Call attempted for a pharmacy transitions of care follow-up.  ?Call attempt #3. Final attempt ?  ? ?Jiles Crocker, PharmD ?Clinical Pharmacist ?[facility] ?01/26/2022 9:22 AM ? ?

## 2022-01-30 ENCOUNTER — Other Ambulatory Visit (HOSPITAL_COMMUNITY): Payer: Self-pay

## 2022-01-30 NOTE — Telephone Encounter (Signed)
Transition Care Management Unsuccessful Follow-up Telephone Call ? ?Date of discharge and from where:  01/23/22 [facility] ? ?Attempts:  2nd Attempt ? ?Reason for unsuccessful TCM follow-up call:  No answer/busy ? ?  ?

## 2022-02-03 ENCOUNTER — Emergency Department (HOSPITAL_COMMUNITY): Payer: Medicaid Other

## 2022-02-03 ENCOUNTER — Inpatient Hospital Stay (HOSPITAL_COMMUNITY)
Admission: EM | Admit: 2022-02-03 | Discharge: 2022-02-10 | DRG: 264 | Disposition: A | Discharge status: Home Health | Payer: Medicaid Other | Attending: Internal Medicine | Admitting: Internal Medicine

## 2022-02-03 ENCOUNTER — Encounter (HOSPITAL_COMMUNITY): Payer: Self-pay

## 2022-02-03 DIAGNOSIS — Y848 Other medical procedures as the cause of abnormal reaction of the patient, or of later complication, without mention of misadventure at the time of the procedure: Secondary | ICD-10-CM | POA: Diagnosis present

## 2022-02-03 DIAGNOSIS — I9789 Other postprocedural complications and disorders of the circulatory system, not elsewhere classified: Secondary | ICD-10-CM | POA: Diagnosis not present

## 2022-02-03 DIAGNOSIS — I5031 Acute diastolic (congestive) heart failure: Secondary | ICD-10-CM | POA: Diagnosis present

## 2022-02-03 DIAGNOSIS — Z955 Presence of coronary angioplasty implant and graft: Secondary | ICD-10-CM | POA: Diagnosis not present

## 2022-02-03 DIAGNOSIS — Z91199 Patient's noncompliance with other medical treatment and regimen due to unspecified reason: Secondary | ICD-10-CM

## 2022-02-03 DIAGNOSIS — E1165 Type 2 diabetes mellitus with hyperglycemia: Secondary | ICD-10-CM | POA: Diagnosis present

## 2022-02-03 DIAGNOSIS — Z794 Long term (current) use of insulin: Secondary | ICD-10-CM

## 2022-02-03 DIAGNOSIS — J449 Chronic obstructive pulmonary disease, unspecified: Secondary | ICD-10-CM | POA: Diagnosis present

## 2022-02-03 DIAGNOSIS — N179 Acute kidney failure, unspecified: Secondary | ICD-10-CM | POA: Diagnosis present

## 2022-02-03 DIAGNOSIS — E871 Hypo-osmolality and hyponatremia: Secondary | ICD-10-CM | POA: Diagnosis present

## 2022-02-03 DIAGNOSIS — Z880 Allergy status to penicillin: Secondary | ICD-10-CM

## 2022-02-03 DIAGNOSIS — Z833 Family history of diabetes mellitus: Secondary | ICD-10-CM

## 2022-02-03 DIAGNOSIS — Z79899 Other long term (current) drug therapy: Secondary | ICD-10-CM

## 2022-02-03 DIAGNOSIS — T827XXA Infection and inflammatory reaction due to other cardiac and vascular devices, implants and grafts, initial encounter: Secondary | ICD-10-CM | POA: Diagnosis present

## 2022-02-03 DIAGNOSIS — Z91041 Radiographic dye allergy status: Secondary | ICD-10-CM | POA: Diagnosis not present

## 2022-02-03 DIAGNOSIS — T8131XA Disruption of external operation (surgical) wound, not elsewhere classified, initial encounter: Secondary | ICD-10-CM | POA: Diagnosis present

## 2022-02-03 DIAGNOSIS — L03314 Cellulitis of groin: Secondary | ICD-10-CM | POA: Diagnosis present

## 2022-02-03 DIAGNOSIS — E1169 Type 2 diabetes mellitus with other specified complication: Secondary | ICD-10-CM | POA: Diagnosis present

## 2022-02-03 DIAGNOSIS — J810 Acute pulmonary edema: Secondary | ICD-10-CM | POA: Diagnosis not present

## 2022-02-03 DIAGNOSIS — Z20822 Contact with and (suspected) exposure to covid-19: Secondary | ICD-10-CM | POA: Diagnosis present

## 2022-02-03 DIAGNOSIS — J9601 Acute respiratory failure with hypoxia: Secondary | ICD-10-CM | POA: Diagnosis not present

## 2022-02-03 DIAGNOSIS — Z7982 Long term (current) use of aspirin: Secondary | ICD-10-CM

## 2022-02-03 DIAGNOSIS — Z7902 Long term (current) use of antithrombotics/antiplatelets: Secondary | ICD-10-CM | POA: Diagnosis not present

## 2022-02-03 DIAGNOSIS — E785 Hyperlipidemia, unspecified: Secondary | ICD-10-CM | POA: Diagnosis present

## 2022-02-03 DIAGNOSIS — I110 Hypertensive heart disease with heart failure: Secondary | ICD-10-CM | POA: Diagnosis present

## 2022-02-03 DIAGNOSIS — I25119 Atherosclerotic heart disease of native coronary artery with unspecified angina pectoris: Secondary | ICD-10-CM | POA: Diagnosis not present

## 2022-02-03 DIAGNOSIS — Z89612 Acquired absence of left leg above knee: Secondary | ICD-10-CM

## 2022-02-03 DIAGNOSIS — F17210 Nicotine dependence, cigarettes, uncomplicated: Secondary | ICD-10-CM | POA: Diagnosis present

## 2022-02-03 DIAGNOSIS — Z825 Family history of asthma and other chronic lower respiratory diseases: Secondary | ICD-10-CM

## 2022-02-03 DIAGNOSIS — S31109A Unspecified open wound of abdominal wall, unspecified quadrant without penetration into peritoneal cavity, initial encounter: Secondary | ICD-10-CM | POA: Diagnosis not present

## 2022-02-03 DIAGNOSIS — Z89431 Acquired absence of right foot: Secondary | ICD-10-CM

## 2022-02-03 DIAGNOSIS — T8149XA Infection following a procedure, other surgical site, initial encounter: Principal | ICD-10-CM

## 2022-02-03 DIAGNOSIS — Z9049 Acquired absence of other specified parts of digestive tract: Secondary | ICD-10-CM | POA: Diagnosis not present

## 2022-02-03 DIAGNOSIS — T8130XA Disruption of wound, unspecified, initial encounter: Secondary | ICD-10-CM

## 2022-02-03 DIAGNOSIS — T8141XA Infection following a procedure, superficial incisional surgical site, initial encounter: Secondary | ICD-10-CM | POA: Diagnosis present

## 2022-02-03 DIAGNOSIS — I2510 Atherosclerotic heart disease of native coronary artery without angina pectoris: Secondary | ICD-10-CM | POA: Diagnosis present

## 2022-02-03 DIAGNOSIS — E1151 Type 2 diabetes mellitus with diabetic peripheral angiopathy without gangrene: Secondary | ICD-10-CM | POA: Diagnosis not present

## 2022-02-03 DIAGNOSIS — I10 Essential (primary) hypertension: Secondary | ICD-10-CM | POA: Diagnosis not present

## 2022-02-03 DIAGNOSIS — Z8249 Family history of ischemic heart disease and other diseases of the circulatory system: Secondary | ICD-10-CM

## 2022-02-03 HISTORY — DX: Gastro-esophageal reflux disease without esophagitis: K21.9

## 2022-02-03 LAB — COMPREHENSIVE METABOLIC PANEL
ALT: 12 U/L (ref 0–44)
AST: 14 U/L — ABNORMAL LOW (ref 15–41)
Albumin: 2.7 g/dL — ABNORMAL LOW (ref 3.5–5.0)
Alkaline Phosphatase: 97 U/L (ref 38–126)
Anion gap: 7 (ref 5–15)
BUN: 16 mg/dL (ref 8–23)
CO2: 28 mmol/L (ref 22–32)
Calcium: 8.7 mg/dL — ABNORMAL LOW (ref 8.9–10.3)
Chloride: 99 mmol/L (ref 98–111)
Creatinine, Ser: 1.26 mg/dL — ABNORMAL HIGH (ref 0.61–1.24)
GFR, Estimated: >60 mL/min (ref >60)
Glucose, Bld: 313 mg/dL — ABNORMAL HIGH (ref 70–99)
Potassium: 3.8 mmol/L (ref 3.5–5.1)
Sodium: 134 mmol/L — ABNORMAL LOW (ref 135–145)
Total Bilirubin: 0.2 mg/dL — ABNORMAL LOW (ref 0.3–1.2)
Total Protein: 7.2 g/dL (ref 6.5–8.1)

## 2022-02-03 LAB — URINALYSIS, ROUTINE W REFLEX MICROSCOPIC
Bacteria, UA: NONE SEEN
Bilirubin Urine: NEGATIVE
Glucose, UA: >=500 mg/dL — AB
Ketones, ur: NEGATIVE mg/dL
Leukocytes,Ua: NEGATIVE
Nitrite: NEGATIVE
Protein, ur: >=300 mg/dL — AB
Specific Gravity, Urine: 1.02 (ref 1.005–1.030)
pH: 6 (ref 5.0–8.0)

## 2022-02-03 LAB — PROTIME-INR
INR: 1 (ref 0.8–1.2)
Prothrombin Time: 13.3 seconds (ref 11.4–15.2)

## 2022-02-03 LAB — RESP PANEL BY RT-PCR (FLU A&B, COVID) ARPGX2
Influenza A by PCR: NEGATIVE
Influenza B by PCR: NEGATIVE
SARS Coronavirus 2 by RT PCR: NEGATIVE

## 2022-02-03 LAB — CBC WITH DIFFERENTIAL/PLATELET
Abs Immature Granulocytes: 0.02 10*3/uL (ref 0.00–0.07)
Basophils Absolute: 0 10*3/uL (ref 0.0–0.1)
Basophils Relative: 0 %
Eosinophils Absolute: 0.2 10*3/uL (ref 0.0–0.5)
Eosinophils Relative: 4 %
HCT: 35.8 % — ABNORMAL LOW (ref 39.0–52.0)
Hemoglobin: 11.5 g/dL — ABNORMAL LOW (ref 13.0–17.0)
Immature Granulocytes: 0 %
Lymphocytes Relative: 28 %
Lymphs Abs: 1.4 10*3/uL (ref 0.7–4.0)
MCH: 27.3 pg (ref 26.0–34.0)
MCHC: 32.1 g/dL (ref 30.0–36.0)
MCV: 84.8 fL (ref 80.0–100.0)
Monocytes Absolute: 0.4 10*3/uL (ref 0.1–1.0)
Monocytes Relative: 7 %
Neutro Abs: 3.1 10*3/uL (ref 1.7–7.7)
Neutrophils Relative %: 61 %
Platelets: 234 10*3/uL (ref 150–400)
RBC: 4.22 MIL/uL (ref 4.22–5.81)
RDW: 13.5 % (ref 11.5–15.5)
WBC: 5.1 10*3/uL (ref 4.0–10.5)
nRBC: 0 % (ref 0.0–0.2)

## 2022-02-03 LAB — LACTIC ACID, PLASMA: Lactic Acid, Venous: 1.8 mmol/L (ref 0.5–1.9)

## 2022-02-03 MED ORDER — PANTOPRAZOLE SODIUM 40 MG PO TBEC
40.0000 mg | DELAYED_RELEASE_TABLET | Freq: Every day | ORAL | Status: DC
  Administered 2022-02-05 – 2022-02-10 (×5): 40 mg via ORAL
  Filled 2022-02-03 (×6): qty 1

## 2022-02-03 MED ORDER — ONDANSETRON HCL 4 MG/2ML IJ SOLN
4.0000 mg | Freq: Once | INTRAMUSCULAR | Status: AC
  Administered 2022-02-03: 4 mg via INTRAVENOUS
  Filled 2022-02-03: qty 2

## 2022-02-03 MED ORDER — VANCOMYCIN HCL 2000 MG/400ML IV SOLN
2000.0000 mg | Freq: Once | INTRAVENOUS | Status: DC
Start: 2022-02-03 — End: 2022-02-03
  Filled 2022-02-03: qty 400

## 2022-02-03 MED ORDER — ASPIRIN EC 81 MG PO TBEC
81.0000 mg | DELAYED_RELEASE_TABLET | Freq: Every day | ORAL | Status: DC
  Administered 2022-02-04 – 2022-02-10 (×7): 81 mg via ORAL
  Filled 2022-02-03 (×7): qty 1

## 2022-02-03 MED ORDER — SODIUM CHLORIDE 0.9 % IV SOLN
2.0000 g | Freq: Three times a day (TID) | INTRAVENOUS | Status: DC
  Administered 2022-02-03 – 2022-02-05 (×5): 2 g via INTRAVENOUS
  Filled 2022-02-03 (×5): qty 2

## 2022-02-03 MED ORDER — ACETAMINOPHEN 650 MG RE SUPP: 650.0000 mg | Freq: Four times a day (QID) | RECTAL | Status: DC | PRN

## 2022-02-03 MED ORDER — SODIUM CHLORIDE 0.9 % IV SOLN
2.0000 g | Freq: Once | INTRAVENOUS | Status: DC
Start: 2022-02-03 — End: 2022-02-03

## 2022-02-03 MED ORDER — MORPHINE SULFATE (PF) 4 MG/ML IV SOLN
4.0000 mg | Freq: Once | INTRAVENOUS | Status: AC
  Administered 2022-02-03: 4 mg via INTRAVENOUS
  Filled 2022-02-03: qty 1

## 2022-02-03 MED ORDER — HYDROMORPHONE HCL 1 MG/ML IJ SOLN
1.0000 mg | INTRAMUSCULAR | Status: DC | PRN
  Administered 2022-02-04 – 2022-02-05 (×7): 1 mg via INTRAVENOUS
  Filled 2022-02-03 (×7): qty 1

## 2022-02-03 MED ORDER — METRONIDAZOLE 500 MG/100ML IV SOLN
500.0000 mg | Freq: Once | INTRAVENOUS | Status: AC
  Administered 2022-02-03: 500 mg via INTRAVENOUS
  Filled 2022-02-03: qty 100

## 2022-02-03 MED ORDER — VANCOMYCIN HCL IN DEXTROSE 1-5 GM/200ML-% IV SOLN: 1000.0000 mg | Freq: Once | INTRAVENOUS | Status: DC

## 2022-02-03 MED ORDER — AMLODIPINE BESYLATE 10 MG PO TABS
10.0000 mg | ORAL_TABLET | Freq: Every day | ORAL | Status: DC
Start: 2022-02-04 — End: 2022-02-10
  Administered 2022-02-04 – 2022-02-10 (×7): 10 mg via ORAL
  Filled 2022-02-03 (×3): qty 1
  Filled 2022-02-03: qty 2
  Filled 2022-02-03 (×3): qty 1

## 2022-02-03 MED ORDER — HYDRALAZINE HCL 25 MG PO TABS
25.0000 mg | ORAL_TABLET | Freq: Three times a day (TID) | ORAL | Status: DC
Start: 2022-02-03 — End: 2022-02-10
  Administered 2022-02-04 – 2022-02-10 (×18): 25 mg via ORAL
  Filled 2022-02-03 (×20): qty 1

## 2022-02-03 MED ORDER — ATORVASTATIN CALCIUM 40 MG PO TABS
40.0000 mg | ORAL_TABLET | Freq: Every day | ORAL | Status: DC
  Administered 2022-02-04 – 2022-02-09 (×6): 40 mg via ORAL
  Filled 2022-02-03 (×6): qty 1

## 2022-02-03 MED ORDER — CLOPIDOGREL BISULFATE 75 MG PO TABS
75.0000 mg | ORAL_TABLET | Freq: Every day | ORAL | Status: DC
Start: 2022-02-04 — End: 2022-02-04

## 2022-02-03 MED ORDER — FUROSEMIDE 20 MG PO TABS: 40.0000 mg | ORAL_TABLET | Freq: Every day | ORAL | Status: DC

## 2022-02-03 MED ORDER — SODIUM CHLORIDE 0.9% FLUSH
3.0000 mL | Freq: Two times a day (BID) | INTRAVENOUS | Status: DC
  Administered 2022-02-04 – 2022-02-10 (×13): 3 mL via INTRAVENOUS

## 2022-02-03 MED ORDER — ONDANSETRON HCL 4 MG/2ML IJ SOLN: 4.0000 mg | Freq: Four times a day (QID) | INTRAMUSCULAR | Status: DC | PRN

## 2022-02-03 MED ORDER — VANCOMYCIN HCL 2000 MG/400ML IV SOLN
2000.0000 mg | INTRAVENOUS | Status: DC
  Administered 2022-02-03: 2000 mg via INTRAVENOUS
  Filled 2022-02-03 (×3): qty 400

## 2022-02-03 MED ORDER — ACETAMINOPHEN 325 MG PO TABS: 650.0000 mg | ORAL_TABLET | Freq: Four times a day (QID) | ORAL | Status: DC | PRN

## 2022-02-03 MED ORDER — CARVEDILOL 25 MG PO TABS
25.0000 mg | ORAL_TABLET | Freq: Two times a day (BID) | ORAL | Status: DC
  Administered 2022-02-04 – 2022-02-10 (×13): 25 mg via ORAL
  Filled 2022-02-03 (×4): qty 1
  Filled 2022-02-03: qty 8
  Filled 2022-02-03 (×7): qty 1
  Filled 2022-02-03: qty 8

## 2022-02-03 MED ORDER — ONDANSETRON HCL 4 MG PO TABS: 4.0000 mg | ORAL_TABLET | Freq: Four times a day (QID) | ORAL | Status: DC | PRN

## 2022-02-03 MED ORDER — IRBESARTAN 75 MG PO TABS
75.0000 mg | ORAL_TABLET | Freq: Every day | ORAL | Status: DC
  Administered 2022-02-04 – 2022-02-06 (×3): 75 mg via ORAL
  Filled 2022-02-03 (×3): qty 1

## 2022-02-03 NOTE — Progress Notes (Signed)
Pharmacy Antibiotic Note ? ?Davidson Palmieri. is a 66 y.o. male admitted on 02/03/2022 with  wound infection .  Pharmacy has been consulted for cefepime and vancomycin dosing. ? ?Patient with a history of CAD, DM, HLD, HTN, osteomyelitis. Patient presenting with Wound Dehiscence. ? ?SCr 1.26 - at baseline ?WBC 5.1; LA 1.8; T 99 F; HR 80; RR 17 ? ?Plan: ?Flagyl per MD ?Cefepime 2g q8hr ?Vancomycin 2000 mg q24hr (eAUC 510) unless change in renal function ?Trend WBC, Fever, Renal function, & Clinical course ?F/u cultures, clinical course, WBC, fever ?De-escalate when able ?Levels at steady state ? ?  ? ?Temp (24hrs), Avg:99 ?F (37.2 ?C), Min:99 ?F (37.2 ?C), Max:99 ?F (37.2 ?C) ? ?Recent Labs  ?Lab 02/03/22 ?1947  ?WBC 5.1  ?CREATININE 1.26*  ?LATICACIDVEN 1.8  ?  ?CrCl cannot be calculated (Unknown ideal weight.).   ? ?Allergies  ?Allergen Reactions  ? Iodinated Contrast Media Hives, Rash and Other (See Comments)  ?  Blisters ?Staph ?Blisters / staph  ? Penicillins Other (See Comments)  ?  UNSPECIFIED REACTION  ?Has patient had a PCN reaction causing immediate rash, facial/tongue/throat swelling, SOB or lightheadedness with hypotension: No ?Has patient had a PCN reaction causing severe rash involving mucus membranes or skin necrosis: No ?Has patient had a PCN reaction that required hospitalization: No ?Has patient had a PCN reaction occurring within the last 10 years: No ?If all of the above answers are "NO", then may proceed with Cephalosporin use.  ? ? ?Antimicrobials this admission: ?flagyl 3/11 >>  ?vancomycin 3/11 >>  ?cefepime 3/11 >>  ? ?Microbiology results: ?Pending ? ?Thank you for allowing pharmacy to be a part of this patient?s care. ? ?Delmar Landau, PharmD, BCPS ?02/03/2022 9:45 PM ?ED Clinical Pharmacist -  [phone] ? ? ?

## 2022-02-03 NOTE — ED Triage Notes (Signed)
Pt states that he left the hospital on the 28th after having a surgery in his groin, wound dehiscence now over the past several days with drainage and foul odor, denies fevers.  ?

## 2022-02-03 NOTE — H&P (Addendum)
Hospital Consult    Reason for Consult:  right groin non-healing surgical wound Referring Physician:  Dr. Melina Copa MRN #:  [id number]  History of Present Illness: This is a 66 y.o. male status post right iliofemoral endarterectomy with bovine pericardial patch angioplasty and right transmetatarsal amputation over 1 month ago.  He was subsequently in rehab but is now home.  He states that for the past 2 days he has had redness and drainage from the right groin.  He states that the drainage has been yellow in nature and scant to mild amount.  He denies any fevers or chills.  He continues aspirin, Plavix and a statin.  Past Medical History:  Diagnosis Date   Coronary artery disease    Diabetes mellitus without complication ([place])    Hyperlipidemia    Hypertension    Osteomyelitis ([place]) 03/25/2017   RT FOOT    Past Surgical History:  Procedure Laterality Date   AMPUTATION Right 03/27/2017   Procedure: 1st Ray Amputation Right Foot;  Surgeon: Newt Minion, MD;  Location: [facility];  Service: Orthopedics;  Laterality: Right;   APPENDECTOMY     BELOW KNEE LEG AMPUTATION Left    CARDIAC CATHETERIZATION     CORONARY STENT INTERVENTION  2005   ENDARTERECTOMY Right 01/01/2022   Procedure: RIGHT ILEOFEMORAL ENDARTERECTOMY with PATCH ANGIOPLASTY;  Surgeon: Broadus John, MD;  Location: [facility] OR;  Service: Vascular;  Laterality: Right;   Great toe amputation right.     I & D EXTREMITY Left 12/30/2015   Procedure: IRRIGATION AND DEBRIDEMENT LEFT FOOT, TRANSMETATARSAL AMPUTATION WITH APPLICATION OF ANTIBIOTIC BEADS AND WOUND VAC;  Surgeon: Newt Minion, MD;  Location: [facility];  Service: Orthopedics;  Laterality: Left;   IR GASTROSTOMY TUBE MOD SED  12/23/2018   LOWER EXTREMITY ANGIOGRAPHY Right 12/29/2021   Procedure: LOWER EXTREMITY ANGIOGRAPHY;  Surgeon: Cherre Robins, MD;  Location: [facility];  Service: Cardiovascular;  Laterality: Right;   TONSILLECTOMY     TRACHEOSTOMY  11/2018    TRANSMETATARSAL AMPUTATION Right 01/01/2022   Procedure: RIGHT TRANSMETATARSAL AMPUTATION;  Surgeon: Broadus John, MD;  Location: [facility];  Service: Vascular;  Laterality: Right;    Allergies  Allergen Reactions   Iodinated Contrast Media Hives, Rash and Other (See Comments)    Blisters Staph Blisters / staph   Penicillins Other (See Comments)    UNSPECIFIED REACTION  Has patient had a PCN reaction causing immediate rash, facial/tongue/throat swelling, SOB or lightheadedness with hypotension: No Has patient had a PCN reaction causing severe rash involving mucus membranes or skin necrosis: No Has patient had a PCN reaction that required hospitalization: No Has patient had a PCN reaction occurring within the last 10 years: No If all of the above answers are "NO", then may proceed with Cephalosporin use.    Prior to Admission medications   Medication Sig Start Date End Date Taking? Authorizing Provider  acetaminophen (TYLENOL) 325 MG tablet Take 1-2 tablets (325-650 mg total) by mouth every 4 (four) hours as needed for mild pain. 01/22/22   Angiulli, Lavon Paganini, PA-C  amLODipine (NORVASC) 10 MG tablet Take 1 tablet (10 mg total) by mouth daily. 01/22/22 02/21/22  Angiulli, Lavon Paganini, PA-C  aspirin EC 81 MG tablet Take 81 mg by mouth daily.    [provider]  atorvastatin (LIPITOR) 40 MG tablet Take 1 tablet (40 mg total) by mouth daily at 6 PM. 01/22/22   Angiulli, Lavon Paganini, PA-C  carvedilol (COREG) 25 MG  tablet Take 1 tablet (25 mg total) by mouth 2 (two) times daily with a meal. 01/22/22 02/21/22  Angiulli, Lavon Paganini, PA-C  clopidogrel (PLAVIX) 75 MG tablet Take 1 tablet (75 mg total) by mouth daily. 01/22/22 02/21/22  Angiulli, Lavon Paganini, PA-C  diclofenac Sodium (VOLTAREN) 1 % GEL Apply 2 g topically 4 (four) times daily. 01/22/22   Angiulli, Lavon Paganini, PA-C  furosemide (LASIX) 40 MG tablet Take 1 tablet (40 mg total) by mouth daily. 01/23/22 01/23/23  Angiulli, Lavon Paganini, PA-C  hydrALAZINE  (APRESOLINE) 25 MG tablet Take 1 tablet (25 mg total) by mouth every 8 (eight) hours. 01/22/22 02/21/22  Angiulli, Lavon Paganini, PA-C  HYDROmorphone (DILAUDID) 2 MG tablet Take 1 tablet (2 mg total) by mouth every 4 (four) hours as needed for severe pain. 01/22/22   Angiulli, Lavon Paganini, PA-C  insulin detemir (LEVEMIR) 100 UNIT/ML injection Inject 0.24 mLs (24 Units total) into the skin at bedtime. 01/22/22   Angiulli, Lavon Paganini, PA-C  Insulin Syringe-Needle U-100 30G X 1/2" 0.5 ML MISC Use with Levemir 01/23/22   Angiulli, Lavon Paganini, PA-C  irbesartan (AVAPRO) 75 MG tablet Take 1 tablet (75 mg total) by mouth daily. 01/22/22 02/21/22  Angiulli, Lavon Paganini, PA-C  pantoprazole (PROTONIX) 40 MG tablet Take 1 tablet (40 mg total) by mouth daily. 01/22/22   Angiulli, Lavon Paganini, PA-C  senna-docusate (SENOKOT-S) 8.6-50 MG tablet Take 1 tablet by mouth 2 (two) times daily. 01/22/22   Angiulli, Lavon Paganini, PA-C  insulin glargine (LANTUS) 100 UNIT/ML injection Inject 0.05 mLs (5 Units total) into the skin at bedtime. 03/28/17 02/20/19  Everrett Coombe, MD    Social History   Socioeconomic History   Marital status: Divorced    Spouse name: Not on file   Number of children: Not on file   Years of education: Not on file   Highest education level: Not on file  Occupational History   Not on file  Tobacco Use   Smoking status: Former    Packs/day: 2.00    Years: 35.00    Pack years: 70.00    Types: Cigarettes    Quit date: 11/28/2018    Years since quitting: 3.1   Smokeless tobacco: Never  Vaping Use   Vaping Use: Former  Substance and Sexual Activity   Alcohol use: Yes    Comment: beers occasionally   Drug use: No   Sexual activity: Not on file  Other Topics Concern   Not on file  Social History Narrative   Lives with mother.  He has three children.     Social Determinants of Health   Financial Resource Strain: Not on file  Food Insecurity: Not on file  Transportation Needs: Not on file  Physical Activity: Not on  file  Stress: Not on file  Social Connections: Not on file  Intimate Partner Violence: Not on file    Family History  Problem Relation Age of Onset   Diabetes Mother    Emphysema Father    CAD Sister 44       Died of MI after flu    Review of Systems  Constitutional: Negative.   HENT: Negative.    Eyes: Negative.   Respiratory: Negative.    Cardiovascular: Negative.   Gastrointestinal: Negative.   Musculoskeletal:        Groin wound drainage  Skin: Negative.   Neurological: Negative.   Endo/Heme/Allergies: Negative.   Psychiatric/Behavioral: Negative.       Physical Examination  Vitals:  02/03/22 2004 02/03/22 2023  BP: (!) 172/94 (!) 120/94  Pulse: 79 80  Resp:  17  Temp:    SpO2: 92% 95%   There is no height or weight on file to calculate BMI.  Physical Exam HENT:     Head: Normocephalic.     Nose: Nose normal.  Eyes:     Pupils: Pupils are equal, round, and reactive to light.  Cardiovascular:     Rate and Rhythm: Normal rate.     Pulses:          Dorsalis pedis pulses are 0 on the right side.       Posterior tibial pulses are 0 on the right side.  Abdominal:     General: Abdomen is flat.     Palpations: Abdomen is soft.  Skin:    Comments: Right groin and right transmetatarsal amputation as pictured below  Neurological:     General: No focal deficit present.     Mental Status: He is alert.  Psychiatric:        Mood and Affect: Mood normal.        CBC    Component Value Date/Time   WBC 5.1 02/03/2022 1947   RBC 4.22 02/03/2022 1947   HGB 11.5 (L) 02/03/2022 1947   HCT 35.8 (L) 02/03/2022 1947   PLT 234 02/03/2022 1947   MCV 84.8 02/03/2022 1947   MCH 27.3 02/03/2022 1947   MCHC 32.1 02/03/2022 1947   RDW 13.5 02/03/2022 1947   LYMPHSABS 1.4 02/03/2022 1947   MONOABS 0.4 02/03/2022 1947   EOSABS 0.2 02/03/2022 1947   BASOSABS 0.0 02/03/2022 1947    BMET    Component Value Date/Time   NA 134 (L) 02/03/2022 1947   K 3.8  02/03/2022 1947   CL 99 02/03/2022 1947   CO2 28 02/03/2022 1947   GLUCOSE 313 (H) 02/03/2022 1947   BUN 16 02/03/2022 1947   CREATININE 1.26 (H) 02/03/2022 1947   CALCIUM 8.7 (L) 02/03/2022 1947   GFRNONAA >60 02/03/2022 1947   GFRAA >60 02/15/2019 0455    COAGS: Lab Results  Component Value Date   INR 1.0 02/03/2022   INR 1.26 12/23/2018   INR 1.17 12/10/2018     Non-Invasive Vascular Imaging:   No studies   ASSESSMENT/PLAN: This is a 66 y.o. male with recent right iliofemoral endarterectomy with bovine pericardial patch angioplasty and now right groin wound with minimal drainage and I cannot express anything on exam.  This is certainly concerning for underlying patch infection.  Given patient's severe contrast allergy of recommended noncontrasted CT scan to evaluate for underlying fluid collection.  He will require IV antibiotics.  He is okay for diet at this time and to be n.p.o. past midnight in the event he requires debridement tomorrow in the operating room.  I discussed the plan with the patient and his family and they demonstrate good understanding.  Stefany Starace C. Donzetta Matters, MD [facility] Office: [phone] Pager: [phone]

## 2022-02-03 NOTE — H&P (Addendum)
Date: 02/04/2022               Patient Name:  [name]. MRN: [id number]  DOB: 20-Aug-1956 Age / Sex: 66 y.o., male   PCP: Dema Severin, NP         Medical Service: Internal Medicine Teaching Service         Attending Physician: Dr. Reymundo Poll, MD    First Contact: Ilene Qua, MD Pager: AD [phone]  Second Contact: Merrilyn Puma, MD Pager: Nada Maclachlan [phone]       After Hours (After 5p/  First Contact Pager: [phone]  weekends / holidays): Second Contact Pager: [phone]   SUBJECTIVE  Chief Complaint: Groin pain  History of Present Illness: Rockey Guarino. is a 66 y.o. male with a pertinent PMH of HTN, CAD s/p stent placment, PAD s/p L BKA (2019) and LSFA stenting (2013), uncontrolled T2DM, hx of right foot osteomyelitis s/p right iliofemoral endarterectomy and right metatarsal amputation who presents to [facility] with groin pain. Patient had the procedure mentioned above last month after his presentation for the osteomyletis and was in CIR until 02/27. He states he was in his usual state of health 2 days ago and had onset of pain in his groin that suddenly started. He noted drainage and redness at the previous surgical site. He states otherwise he was okay prior to coming to the ED with no issues in his right lower leg distal to the groin. He denied any fevers, or chills and endorsed a good appetite. He had some coughing and endorsed nausea that he attributed to the morphine he obtained during in the ED and stated the medicine made him feel sick.  Review Of Systems: Negative unless stated otherwise in the HPI.  ED course: Patient initially given morphine for pain and developed nausea and coughing afterwards. He became hypoxic after 4 mg morphine dose. He was given Zofran and placed on Desloge which resolved his hypoxia. Lab work ordered and shown below. Vascular surgery was consulted and they recommended obtaining a CT and starting antibiotic for cellulitis coverage with potential  for surgical intervention if indicated. IM team paged for admission.    Medications: No current facility-administered medications on file prior to encounter.   Current Outpatient Medications on File Prior to Encounter  Medication Sig Dispense Refill   acetaminophen (TYLENOL) 325 MG tablet Take 1-2 tablets (325-650 mg total) by mouth every 4 (four) hours as needed for mild pain.     amLODipine (NORVASC) 10 MG tablet Take 1 tablet (10 mg total) by mouth daily. 30 tablet 0   aspirin EC 81 MG tablet Take 81 mg by mouth daily.     atorvastatin (LIPITOR) 40 MG tablet Take 1 tablet (40 mg total) by mouth daily at 6 PM. 30 tablet 0   carvedilol (COREG) 25 MG tablet Take 1 tablet (25 mg total) by mouth 2 (two) times daily with a meal. 60 tablet 0   clopidogrel (PLAVIX) 75 MG tablet Take 1 tablet (75 mg total) by mouth daily. 30 tablet 0   diclofenac Sodium (VOLTAREN) 1 % GEL Apply 2 g topically 4 (four) times daily. 200 g 0   furosemide (LASIX) 40 MG tablet Take 1 tablet (40 mg total) by mouth daily. 30 tablet 0   hydrALAZINE (APRESOLINE) 25 MG tablet Take 1 tablet (25 mg total) by mouth every 8 (eight) hours. 90 tablet 0   HYDROmorphone (DILAUDID) 2 MG tablet Take 1 tablet (2 mg total)  by mouth every 4 (four) hours as needed for severe pain. 30 tablet 0   insulin detemir (LEVEMIR) 100 UNIT/ML injection Inject 0.24 mLs (24 Units total) into the skin at bedtime. 10 mL 11   Insulin Syringe-Needle U-100 30G X 1/2" 0.5 ML MISC Use with Levemir 40 each 0   irbesartan (AVAPRO) 75 MG tablet Take 1 tablet (75 mg total) by mouth daily. 30 tablet 0   pantoprazole (PROTONIX) 40 MG tablet Take 1 tablet (40 mg total) by mouth daily. 30 tablet 0   senna-docusate (SENOKOT-S) 8.6-50 MG tablet Take 1 tablet by mouth 2 (two) times daily.     [DISCONTINUED] insulin glargine (LANTUS) 100 UNIT/ML injection Inject 0.05 mLs (5 Units total) into the skin at bedtime. 10 mL 11    Past Medical History:  Past Medical History:   Diagnosis Date   Coronary artery disease    Diabetes mellitus without complication ([facility])    Hyperlipidemia    Hypertension    Osteomyelitis ([facility]) 03/25/2017   RT FOOT    Social:  Patient lives with daughter and he states he is independent in ADLs. He endorsed tobacco use, with 10-15 cigarettes per day for 50 years, denies alcohol use; no illicit substance use. During prior admission he endorsed quitting for 3 years which he did not during this admission.   Social History   Socioeconomic History   Marital status: Divorced    Spouse name: Not on file   Number of children: Not on file   Years of education: Not on file   Highest education level: Not on file  Occupational History   Not on file  Tobacco Use   Smoking status: Former    Packs/day: 2.00    Years: 35.00    Pack years: 70.00    Types: Cigarettes    Quit date: 11/28/2018    Years since quitting: 3.1   Smokeless tobacco: Never  Vaping Use   Vaping Use: Former  Substance and Sexual Activity   Alcohol use: Yes    Comment: beers occasionally   Drug use: No   Sexual activity: Not on file  Other Topics Concern   Not on file  Social History Narrative   Lives with mother.  He has three children.     Social Determinants of Health   Financial Resource Strain: Not on file  Food Insecurity: Not on file  Transportation Needs: Not on file  Physical Activity: Not on file  Stress: Not on file  Social Connections: Not on file  Intimate Partner Violence: Not on file     Family History: Family History  Problem Relation Age of Onset   Diabetes Mother    Emphysema Father    CAD Sister 38       Died of MI after flu    Allergies: Allergies as of 02/03/2022 - Review Complete 02/03/2022  Allergen Reaction Noted   Iodinated contrast media Hives, Rash, and Other (See Comments) 07/27/2015   Penicillins Other (See Comments) 12/28/2015    OBJECTIVE:  Physical Exam: Blood pressure (!) 153/70, pulse 85, temperature 99 F  (37.2 C), temperature source Oral, resp. rate 19, SpO2 95 %. Physical Exam General: Appeared in acute distress with repeated coughing and endorsing pain. Head: Normocephalic without lesions. Eyes:PERRL, non-icteric slcera Mouth: Moist mucus membranes CV: Normal heart sounds, normal radial pulses Pulm:Normal lung sounds without rales, rhonchi or wheeze.  Abd:No TTP, normal bowel sounds Pelvic: Erythematous right groin with white discharge. Tender to light touch. See  media tab.   ZOX:WRUESK:Left AKA, Right foot amputation.  Skin: Right leg shows thickened skin on shin which is chronic. Staples at amputation site.  Neuro: Alert and oriented x4. CN grossly normal. Psych: Normal mood and affect  Pertinent Labs: CBC    Component Value Date/Time   WBC 5.1 02/03/2022 1947   RBC 4.22 02/03/2022 1947   HGB 11.5 (L) 02/03/2022 1947   HCT 35.8 (L) 02/03/2022 1947   PLT 234 02/03/2022 1947   MCV 84.8 02/03/2022 1947   MCH 27.3 02/03/2022 1947   MCHC 32.1 02/03/2022 1947   RDW 13.5 02/03/2022 1947   LYMPHSABS 1.4 02/03/2022 1947   MONOABS 0.4 02/03/2022 1947   EOSABS 0.2 02/03/2022 1947   BASOSABS 0.0 02/03/2022 1947     CMP     Component Value Date/Time   NA 134 (L) 02/03/2022 1947   K 3.8 02/03/2022 1947   CL 99 02/03/2022 1947   CO2 28 02/03/2022 1947   GLUCOSE 313 (H) 02/03/2022 1947   BUN 16 02/03/2022 1947   CREATININE 1.26 (H) 02/03/2022 1947   CALCIUM 8.7 (L) 02/03/2022 1947   PROT 7.2 02/03/2022 1947   ALBUMIN 2.7 (L) 02/03/2022 1947   AST 14 (L) 02/03/2022 1947   ALT 12 02/03/2022 1947   ALKPHOS 97 02/03/2022 1947   BILITOT 0.2 (L) 02/03/2022 1947   GFRNONAA >60 02/03/2022 1947   GFRAA >60 02/15/2019 0455   Lactic Acid: 1.8 INR: 1.0 Resp Panel: Negative  Pertinent Imaging: CXR: Clear CXR without active disease.   CT Pelvis WO Contrast: Laceration of skin over the right groin. Induration of subcutaneous soft tissue but no drainable fluid collection or abscess.  Moderate aortoiliac atherosclerosis noted.   ASSESSMENT & PLAN:  Assessment: Norvel RichardsDaniel Watson Moquin Jr. is a 66 y.o. with pertinent PMH of  who presented with multiple cardiovascular risk factors including HTN, T2DM, CAD, PAD with multiple interventions and admitted for pain and drainage after surgical procedure with PE showing erythema and tenderness at the site concerning for infection with CT showing induration of subcutaneous tissue but no fluid collection or abscess.   Plan: #Cellulitis of right inguinal wound Patient presents with pain in the right inguinal region for 2 days after undergoing right iliofemoral endarterectomy with bovine pericardial patch angioplasty and right transmetatarsal amputation around one month ago. Wound showing erythema, pain and discharge is concerning for infection. CT pelvis did not show any fluid collection or abscess but did show induration of subcutaneous tissue. Due to lack of drainable abscess, vascular surgery is not planning any surgical procedures and want to treat medically with antibiotics.  -Start Cefepime 2g q8hrs, Vanc 2 g daily. Patient is s/p once dose flagyl 500 mg -Treat pain with dilaudid 1 mg q 4hrs prn for pain; avoid morphine  -f/u blood cultures -Continue to follow vascular surgery recommendation  -CTM for symptoms of infection  #CAD #PAD #HLD Chronic issue for patient. Home meds include aspirin 81 mg and Plavix 75 mg. Lipitor 40 mg for her HLD.  -Will continue Aspirin 81 mg and Lipitor 40 mg.  -Will hold Plavix until further verification as it does not show on previous medication list   #HTN Chronic. Home meds include Norvasc 10 mg, Coreg 25 mg BID, irbesartan 75 mg qd, hydralazine 25 mg TID.  -Continue home meds  #T2DM Chronic.Home meds include insulin detemir 24 units qhs -Continue home meds  #GERD Chronic.Home med includes protonix 40 mg. -Continue home meds  #Tobacco use disorder Patient has  50 pack year smoking history.   -Counsel on smoking cessation   Best Practice: Diet: Cardiac diet IVF: Fluids: None, Rate VTE: Lovenox Code: Full Status: Observation with expected length of stay less than 2 midnights. Anticipated Discharge Location: Home Barriers to Discharge: Medical workup, IV abx, vascular clearance  Signature: Gwenevere Abbot, MD Internal Medicine Resident, PGY-1 [facility]  Pager: [phone] 1:30 AM, 02/04/2022   Please contact the on call pager after 5 pm and on weekends at [phone].

## 2022-02-03 NOTE — ED Notes (Signed)
Oxygen placed on pt at this time d/t shortness of breath.  ?

## 2022-02-03 NOTE — ED Provider Notes (Signed)
[facility] EMERGENCY DEPARTMENT Provider Note   CSN: [id number] Arrival date & time: 02/03/22  1810     History  Chief Complaint  Patient presents with   Wound Dehiscence    Antonio Richards. is a 66 y.o. male.  Antonio Hark. is a 66 y.o. male with a history of right femoral endarterectomy and right transmetatarsal amputation, as well as diabetes, hypertension, hyperlipidemia, CAD and osteomyelitis, who presents to the emergency department for evaluation of wound infection.  Patient reports the wound in his right groin from his femoral endarterectomy has become more red, swollen and painful with foul-smelling drainage over the past week.  He reports it has become extremely painful, in particular with movement or walking.  He reports white thick drainage has been increasing.  He reports redness has spread along his groin but not down his leg.  Denies any swelling or pain over the testicles.  He denies any fevers or chills.  Reports that his right foot amputation wound has been healing well and he has not had any redness, swelling or drainage to this site.  Patient denies any chest pain or shortness of breath.  No abdominal pain, vomiting or diarrhea.  Reports he was discharged from inpatient rehab on 2/27, has not been on any antibiotics.  Initial surgery was done by Dr. Sherral Hammers and Dr. Juanetta Gosling with vascular surgery.  The history is provided by the patient, a relative and medical records.      Home Medications Prior to Admission medications   Medication Sig Start Date End Date Taking? Authorizing Provider  acetaminophen (TYLENOL) 325 MG tablet Take 1-2 tablets (325-650 mg total) by mouth every 4 (four) hours as needed for mild pain. 01/22/22   Angiulli, Mcarthur Rossetti, PA-C  amLODipine (NORVASC) 10 MG tablet Take 1 tablet (10 mg total) by mouth daily. 01/22/22 02/21/22  Angiulli, Mcarthur Rossetti, PA-C  aspirin EC 81 MG tablet Take 81 mg by mouth daily.    [provider]  atorvastatin (LIPITOR) 40 MG tablet Take 1 tablet (40 mg total) by mouth daily at 6 PM. 01/22/22   Angiulli, Mcarthur Rossetti, PA-C  carvedilol (COREG) 25 MG tablet Take 1 tablet (25 mg total) by mouth 2 (two) times daily with a meal. 01/22/22 02/21/22  Angiulli, Mcarthur Rossetti, PA-C  clopidogrel (PLAVIX) 75 MG tablet Take 1 tablet (75 mg total) by mouth daily. 01/22/22 02/21/22  Angiulli, Mcarthur Rossetti, PA-C  diclofenac Sodium (VOLTAREN) 1 % GEL Apply 2 g topically 4 (four) times daily. 01/22/22   Angiulli, Mcarthur Rossetti, PA-C  furosemide (LASIX) 40 MG tablet Take 1 tablet (40 mg total) by mouth daily. 01/23/22 01/23/23  Angiulli, Mcarthur Rossetti, PA-C  hydrALAZINE (APRESOLINE) 25 MG tablet Take 1 tablet (25 mg total) by mouth every 8 (eight) hours. 01/22/22 02/21/22  Angiulli, Mcarthur Rossetti, PA-C  HYDROmorphone (DILAUDID) 2 MG tablet Take 1 tablet (2 mg total) by mouth every 4 (four) hours as needed for severe pain. 01/22/22   Angiulli, Mcarthur Rossetti, PA-C  insulin detemir (LEVEMIR) 100 UNIT/ML injection Inject 0.24 mLs (24 Units total) into the skin at bedtime. 01/22/22   Angiulli, Mcarthur Rossetti, PA-C  Insulin Syringe-Needle U-100 30G X 1/2" 0.5 ML MISC Use with Levemir 01/23/22   Angiulli, Mcarthur Rossetti, PA-C  irbesartan (AVAPRO) 75 MG tablet Take 1 tablet (75 mg total) by mouth daily. 01/22/22 02/21/22  Angiulli, Mcarthur Rossetti, PA-C  pantoprazole (PROTONIX) 40 MG tablet Take 1 tablet (40 mg total) by  mouth daily. 01/22/22   Angiulli, Mcarthur Rossetti, PA-C  senna-docusate (SENOKOT-S) 8.6-50 MG tablet Take 1 tablet by mouth 2 (two) times daily. 01/22/22   Angiulli, Mcarthur Rossetti, PA-C  insulin glargine (LANTUS) 100 UNIT/ML injection Inject 0.05 mLs (5 Units total) into the skin at bedtime. 03/28/17 02/20/19  Howard Pouch, MD      Allergies    Iodinated contrast media and Penicillins    Review of Systems   Review of Systems  Constitutional:  Negative for chills and fever.  Respiratory:  Negative for shortness of breath.   Cardiovascular:  Negative for chest  pain.  Gastrointestinal:  Negative for abdominal pain, nausea and vomiting.  Genitourinary:  Negative for dysuria.  Musculoskeletal:  Negative for arthralgias and myalgias.  Skin:  Positive for color change and wound.  Neurological:  Negative for weakness and numbness.   Physical Exam Updated Vital Signs BP (!) 120/94 (BP Location: Left Arm)    Pulse 80    Temp 99 F (37.2 C) (Oral)    Resp 17    SpO2 95%  Physical Exam Vitals and nursing note reviewed.  Constitutional:      General: He is not in acute distress.    Appearance: Normal appearance. He is well-developed. He is not diaphoretic.     Comments: Alert, appears uncomfortable, no acute distress  HENT:     Head: Normocephalic and atraumatic.  Eyes:     General:        Right eye: No discharge.        Left eye: No discharge.  Cardiovascular:     Rate and Rhythm: Normal rate and regular rhythm.     Pulses: Normal pulses.     Heart sounds: Normal heart sounds.  Pulmonary:     Effort: Pulmonary effort is normal. No respiratory distress.     Breath sounds: Normal breath sounds. No wheezing or rales.     Comments: Respirations equal and unlabored, patient able to speak in full sentences, lungs clear to auscultation bilaterally  Abdominal:     General: Bowel sounds are normal. There is no distension.     Palpations: Abdomen is soft. There is no mass.     Tenderness: There is no abdominal tenderness. There is no guarding.     Comments: Abdomen soft, nondistended, nontender to palpation in all quadrants without guarding or peritoneal signs  Genitourinary:    Comments: Surgical incision to the right groin without large area of dehiscence but with erythema and induration and some purulent discharge along the incision line but no further expressible drainage, no crepitus, redness extends down into the groin but does not involve the penis or testicles Musculoskeletal:        General: No deformity.     Cervical back: Neck supple.      Comments: Incision to the right transmetatarsal amputation appears to be healing well with staples intact, no erythema, drainage or signs of dehiscence  Skin:    General: Skin is warm and dry.     Capillary Refill: Capillary refill takes less than 2 seconds.  Neurological:     Mental Status: He is alert and oriented to person, place, and time.     Coordination: Coordination normal.     Comments: Speech is clear, able to follow commands Moves extremities without ataxia, coordination intact  Psychiatric:        Mood and Affect: Mood normal.        Behavior: Behavior normal.  ED Results / Procedures / Treatments   Labs (all labs ordered are listed, but only abnormal results are displayed) Labs Reviewed  COMPREHENSIVE METABOLIC PANEL - Abnormal; Notable for the following components:      Result Value   Sodium 134 (*)    Glucose, Bld 313 (*)    Creatinine, Ser 1.26 (*)    Calcium 8.7 (*)    Albumin 2.7 (*)    AST 14 (*)    Total Bilirubin 0.2 (*)    All other components within normal limits  CBC WITH DIFFERENTIAL/PLATELET - Abnormal; Notable for the following components:   Hemoglobin 11.5 (*)    HCT 35.8 (*)    All other components within normal limits  URINALYSIS, ROUTINE W REFLEX MICROSCOPIC - Abnormal; Notable for the following components:   Glucose, UA >=500 (*)    Hgb urine dipstick SMALL (*)    Protein, ur >=300 (*)    All other components within normal limits  RESP PANEL BY RT-PCR (FLU A&B, COVID) ARPGX2  CULTURE, BLOOD (ROUTINE X 2)  CULTURE, BLOOD (ROUTINE X 2)  LACTIC ACID, PLASMA  PROTIME-INR    EKG None  Radiology DG Chest 2 View  Result Date: 02/03/2022 CLINICAL DATA:  Recent groin surgery, having drainage in foul odor, history diabetes mellitus, hypertension, former smoker, coronary artery disease, suspected sepsis EXAM: CHEST - 2 VIEW COMPARISON:  01/15/2022 FINDINGS: Upper normal heart size with slight pulmonary vascular congestion. Normal  mediastinal contours. Bibasilar atelectasis improved from previous exam. Upper lungs clear. No additional infiltrate, pleural effusion, or pneumothorax. Bones demineralized. IMPRESSION: Improved bibasilar atelectasis. Electronically Signed   By: Ulyses SouthwardMark  Boles M.D.   On: 02/03/2022 20:08   CT PELVIS WO CONTRAST  Result Date: 02/03/2022 CLINICAL DATA:  Soft tissue infection suspected. EXAM: CT PELVIS WITHOUT CONTRAST TECHNIQUE: Multidetector CT imaging of the pelvis was performed following the standard protocol without intravenous contrast. RADIATION DOSE REDUCTION: This exam was performed according to the departmental dose-optimization program which includes automated exposure control, adjustment of the mA and/or kV according to patient size and/or use of iterative reconstruction technique. COMPARISON:  None. FINDINGS: Evaluation of this exam is limited in the absence of intravenous contrast. Urinary Tract:  No abnormality visualized. Bowel:  Unremarkable visualized pelvic bowel loops. Vascular/Lymphatic: Moderate aortoiliac atherosclerotic disease. No pelvic adenopathy. Reproductive: The prostate and seminal vesicles are grossly unremarkable. Other: There is laceration of the skin over the right groin. There is a 5 mm radiopaque foreign object approximately 5 mm deep to the skin laceration. There is induration of the surrounding subcutaneous soft tissues with several mildly reactive lymph nodes. No drainable fluid collection or abscess. Musculoskeletal: Mild degenerative changes. No acute osseous pathology. IMPRESSION: 1. Laceration of the skin over the right groin with a 5 mm radiopaque foreign object approximately 5 mm deep to the skin laceration. No drainable fluid collection or abscess. 2. Aortic Atherosclerosis (ICD10-I70.0). Electronically Signed   By: Elgie CollardArash  Radparvar M.D.   On: 02/03/2022 22:25    Procedures Procedures    Medications Ordered in ED Medications  vancomycin (VANCOREADY) IVPB 2000  mg/400 mL (2,000 mg Intravenous New Bag/Given 02/03/22 2222)  ceFEPIme (MAXIPIME) 2 g in sodium chloride 0.9 % 100 mL IVPB (0 g Intravenous Stopped 02/03/22 2220)  morphine (PF) 4 MG/ML injection 4 mg (4 mg Intravenous Given 02/03/22 2116)  ondansetron (ZOFRAN) injection 4 mg (4 mg Intravenous Given 02/03/22 2116)  metroNIDAZOLE (FLAGYL) IVPB 500 mg (0 mg Intravenous Stopped 02/03/22 2330)  ondansetron (ZOFRAN)  injection 4 mg (4 mg Intravenous Given 02/03/22 2246)    ED Course/ Medical Decision Making/ A&P                           This patient presents to the ED for concern of wound infection, this involves an extensive number of treatment options, and is a complaint that carries with it a high risk of complications and morbidity.  The differential diagnosis includes surgical wound dehiscence, abscess, cellulitis, necrotizing fasciitis/Fournier's gangrene Patient without fever or tachycardia, hypertensive, does not meet sepsis criteria at this time   Co morbidities that complicate the patient evaluation  Diabetes, hypertension, hyperlipidemia   Additional history obtained:  Additional history obtained from daughter at bedside External records from outside source obtained and reviewed including recent admission for femoral endarterectomy and transmetatarsal amputation of the right foot   Lab Tests:  I Ordered, and personally interpreted labs.  The pertinent results include: No leukocytosis, stable hemoglobin, glucose 313 but otherwise no significant electrolyte derangements, renal function at baseline, lactic acid is not elevated, UA without signs of infection.   Imaging Studies ordered:  I ordered imaging studies including chest x-ray, had initially planned to order a CT of the pelvis with contrast but patient has a contrast allergy and requires premedication so we will first discuss with vascular surgery I independently visualized and interpreted imaging which showed no acute  cardiopulmonary disease I agree with the radiologist interpretation   Cardiac Monitoring:  The patient was maintained on a cardiac monitor.  I personally viewed and interpreted the cardiac monitored which showed an underlying rhythm of: Normal sinus rhythm   Medicines ordered and prescription drug management:  I ordered medication including morphine and Zofran for pain Reevaluation of the patient after these medicines showed that the patient improved somewhat, then patient reportedly had an episode of hypoxia after receiving morphine but refused to wear nasal cannula at that time, then started having worsening nausea, additional dose of morphine was given and patient placed on nasal cannula oxygen I have reviewed the patients home medicines and have made adjustments as needed   Critical Interventions:  Broad-spectrum antibiotics for skin infection   Consultations Obtained:  I requested consultation with the vascular surgeon, Dr. Randie Heinz,  and discussed lab and imaging findings as well as pertinent plan -they will come in to see and evaluate the patient as he may need to go to the OR for debridement, will hold off on contrasted CT until vascular surgeon sees patient. After Dr. Randie Heinz saw and evaluated patient, he recommended CT pelvis without contrast, he did not feel an obvious abscess or fluid collection, recommends broad-spectrum antibiotics for cellulitis and medicine admission, if there is fluid collection on CT he will plan to take patient to the OR in the morning for debridement I consulted internal medicine teaching service for admission, and they will see and admit the patient.  Problem List / ED Course:  Patient with infection in the right groin over incision from recent femoral endarterectomy, fortunately patient is alert, does not appear to be in acute distress and does not meet sepsis criteria. Vascular surgery consulted, feels at this time this is more likely cellulitis and  recommends treatment with broad-spectrum antibiotics and CT of the pelvis without contrast Lab work is overall been reassuring and fortunately CT does not show obvious fluid collection Admitted to internal medicine teaching service        Final Clinical Impression(s) /  ED Diagnoses Final diagnoses:  Wound infection after surgery    Rx / DC Orders ED Discharge Orders     None         Legrand Rams 02/04/22 0021    Terrilee Files, MD 02/04/22 [phone]

## 2022-02-03 NOTE — ED Notes (Signed)
To room after x-ray  

## 2022-02-03 NOTE — ED Notes (Signed)
Pt noted to be 87-88% after morphine. Attempted to put [place] on pt. Pt refused.  ?

## 2022-02-04 ENCOUNTER — Inpatient Hospital Stay (HOSPITAL_COMMUNITY): Payer: Medicaid Other

## 2022-02-04 DIAGNOSIS — I5031 Acute diastolic (congestive) heart failure: Secondary | ICD-10-CM | POA: Diagnosis not present

## 2022-02-04 DIAGNOSIS — J9601 Acute respiratory failure with hypoxia: Secondary | ICD-10-CM

## 2022-02-04 DIAGNOSIS — T8130XA Disruption of wound, unspecified, initial encounter: Secondary | ICD-10-CM | POA: Diagnosis not present

## 2022-02-04 DIAGNOSIS — T8149XA Infection following a procedure, other surgical site, initial encounter: Secondary | ICD-10-CM | POA: Diagnosis not present

## 2022-02-04 DIAGNOSIS — J810 Acute pulmonary edema: Secondary | ICD-10-CM | POA: Diagnosis not present

## 2022-02-04 LAB — BASIC METABOLIC PANEL
Anion gap: 9 (ref 5–15)
BUN: 26 mg/dL — ABNORMAL HIGH (ref 8–23)
CO2: 26 mmol/L (ref 22–32)
Calcium: 8.5 mg/dL — ABNORMAL LOW (ref 8.9–10.3)
Chloride: 98 mmol/L (ref 98–111)
Creatinine, Ser: 1.54 mg/dL — ABNORMAL HIGH (ref 0.61–1.24)
GFR, Estimated: 50 mL/min — ABNORMAL LOW (ref >60)
Glucose, Bld: 117 mg/dL — ABNORMAL HIGH (ref 70–99)
Potassium: 4.4 mmol/L (ref 3.5–5.1)
Sodium: 133 mmol/L — ABNORMAL LOW (ref 135–145)

## 2022-02-04 LAB — CBG MONITORING, ED
Glucose-Capillary: 164 mg/dL — ABNORMAL HIGH (ref 70–99)
Glucose-Capillary: 221 mg/dL — ABNORMAL HIGH (ref 70–99)
Glucose-Capillary: 191 mg/dL — ABNORMAL HIGH (ref 70–99)

## 2022-02-04 LAB — ECHOCARDIOGRAM LIMITED
Area-P 1/2: 3.6 cm2
S' Lateral: 4.1 cm

## 2022-02-04 LAB — BRAIN NATRIURETIC PEPTIDE: B Natriuretic Peptide: 97.4 pg/mL (ref 0.0–100.0)

## 2022-02-04 MED ORDER — INSULIN GLARGINE-YFGN 100 UNIT/ML ~~LOC~~ SOLN
20.0000 [IU] | Freq: Every day | SUBCUTANEOUS | Status: DC
  Administered 2022-02-04 – 2022-02-09 (×6): 20 [IU] via SUBCUTANEOUS
  Filled 2022-02-04 (×10): qty 0.2

## 2022-02-04 MED ORDER — INSULIN ASPART 100 UNIT/ML IJ SOLN
0.0000 [IU] | Freq: Three times a day (TID) | INTRAMUSCULAR | Status: DC
  Administered 2022-02-04: 3 [IU] via SUBCUTANEOUS
  Administered 2022-02-04: 5 [IU] via SUBCUTANEOUS
  Administered 2022-02-04 – 2022-02-05 (×3): 3 [IU] via SUBCUTANEOUS
  Administered 2022-02-05 – 2022-02-06 (×2): 5 [IU] via SUBCUTANEOUS
  Administered 2022-02-06 – 2022-02-08 (×6): 3 [IU] via SUBCUTANEOUS
  Administered 2022-02-09 (×2): 2 [IU] via SUBCUTANEOUS
  Administered 2022-02-09: 3 [IU] via SUBCUTANEOUS
  Administered 2022-02-10: 2 [IU] via SUBCUTANEOUS

## 2022-02-04 MED ORDER — ENOXAPARIN SODIUM 40 MG/0.4ML IJ SOSY
40.0000 mg | PREFILLED_SYRINGE | INTRAMUSCULAR | Status: DC
  Administered 2022-02-04 – 2022-02-09 (×6): 40 mg via SUBCUTANEOUS
  Filled 2022-02-04 (×6): qty 0.4

## 2022-02-04 MED ORDER — VANCOMYCIN HCL 1750 MG/350ML IV SOLN
1750.0000 mg | INTRAVENOUS | Status: DC
  Administered 2022-02-04: 1750 mg via INTRAVENOUS
  Filled 2022-02-04: qty 350

## 2022-02-04 MED ORDER — FUROSEMIDE 10 MG/ML IJ SOLN
40.0000 mg | Freq: Once | INTRAMUSCULAR | Status: AC
  Administered 2022-02-04: 40 mg via INTRAVENOUS
  Filled 2022-02-04: qty 4

## 2022-02-04 MED ORDER — ALBUTEROL SULFATE (2.5 MG/3ML) 0.083% IN NEBU: 2.5000 mg | INHALATION_SOLUTION | RESPIRATORY_TRACT | Status: DC | PRN

## 2022-02-04 NOTE — Progress Notes (Signed)
OT impression: Pt was evaluated s/p the below RLE pain, he reports being generally mod I at baseline including ADL/IADLs and mobility. He lives in the basement area of his mothers home with his daughter who is able to assist PRN. Upon evaluation pt had complaints of R groin pain and declined to don his LLE prosthetic to attempt OOB transfer. Pt completed bed mobility with mod I and had good sitting balance for EOB ADLs with set up- min A. Pt generally limited by pain, and anticipate good progression back to baseline once pain subsides. Pt will benfit from OT acutely. Recommend HHOT to ensure progress back to mod I baseline in the home setting.  ? ? ? 02/04/22 1100  ?OT Visit Information  ?Last OT Received On 02/04/22  ?Assistance Needed +1  ?History of Present Illness 66 yo male presenting to ED with pain and redness of his R groin area. Pt had R iliofemoral endarterectomy and TMA on 2/6. PMH including HTN, diabetes, PAD s/p LSFA stenting, s/p left BKA (2019), and tobacco use disorder.  ?Precautions  ?Precautions Fall  ?Precaution Comments R TMA; prothesis for L prior BKA in room  ?Restrictions  ?Weight Bearing Restrictions Yes  ?RLE Weight Bearing PWB  ?RLE Partial Weight Bearing Percentage or Pounds heel  ?Other Position/Activity Restrictions no orders placed, from prior vascular sx note & pt report  ?Home Living  ?Family/patient expects to be discharged to: Private residence ?(mother lives upstairs, daughter and pt live in basement)  ?Living Arrangements Parent;Children  ?Available Help at Discharge Family;Available PRN/intermittently  ?Type of Home House  ?Home Access Level entry  ?Home Layout Multi-level;Able to live on main level with bedroom/bathroom  ?Alternate Level Stairs-Number of Steps flight but pt does not go upstairs  ?Bathroom Shower/Tub Walk-in shower  ?Bathroom Toilet Handicapped height  ?Bathroom Accessibility Yes  ?How Accessible Accessible via wheelchair;Accessible via walker  ?Home Equipment  Shower seat;Electric scooter;Hand held Stage manager (4 wheels);Wheelchair - power  ?Additional Comments Lives in the baseline of mother's home  ? Lives With Daughter;Family  ?Prior Function  ?Prior Level of Function  Independent/Modified Independent  ?Mobility Comments mobilizes at wc level, dons prosthetic "almost" every morning. stand-pivots or ambulates short distances indep  ?ADLs Comments reports mod I, daughters assists with IADLs as needed  ?Communication  ?Communication No difficulties  ?Pain Assessment  ?Pain Assessment 0-10  ?Pain Score 8  ?Pain Location groin  ?Pain Descriptors / Indicators Discomfort;Grimacing;Guarding  ?Pain Intervention(s) Limited activity within patient's tolerance;Monitored during session  ?Cognition  ?Arousal/Alertness Awake/alert  ?Behavior During Therapy [facility] for tasks assessed/performed  ?Overall Cognitive Status No family/caregiver present to determine baseline cognitive functioning  ?General Comments limited insight to safety and deficits  ?Upper Extremity Assessment  ?Upper Extremity Assessment Overall WFL for tasks assessed  ?Lower Extremity Assessment  ?Lower Extremity Assessment Defer to PT evaluation  ?Cervical / Trunk Assessment  ?Cervical / Trunk Assessment Kyphotic  ?Vision- History  ?Baseline Vision/History 1 Wears glasses  ?Ability to See in Adequate Light 0 Adequate  ?Patient Visual Report No change from baseline  ?Vision- Assessment  ?Vision Assessment? No apparent visual deficits  ?Perception  ?Perception Tested? No  ?Praxis  ?Praxis tested? Not tested  ?ADL  ?Overall ADL's  Needs assistance/impaired  ?Eating/Feeding Independent;Sitting  ?Grooming Set up;Sitting  ?Upper Body Bathing Set up;Sitting  ?Lower Body Bathing Minimal assistance;Sitting/lateral leans  ?Upper Body Dressing  Set up;Sitting  ?Lower Body Dressing Minimal assistance;Sitting/lateral leans  ?Toilet Transfer Minimal assistance  ?Statistician Details (indicate  cue type and reason) with   prosthetic donned  ?Functional mobility during ADLs Minimal assistance ?(indep bed mobility)  ?General ADL Comments pt declined to don his prosthetic this session due to groin pain  ?Bed Mobility  ?Overal bed mobility Modified Independent  ?General bed mobility comments incr time for pain management  ?Transfers  ?Overall transfer level Needs assistance  ?General transfer comment pt declined 2/2 groin pain despite encouragement  ?Balance  ?Overall balance assessment Needs assistance  ?Sitting-balance support No upper extremity supported;Feet unsupported  ?Sitting balance-Leahy Scale Good  ?Sitting balance - Comments sitting EOB, no support, during functional tasks without LOB  ?Written Expression  ?Dominant Hand Right  ? ?

## 2022-02-04 NOTE — Progress Notes (Signed)
?  Progress Note ? ? ? ?02/04/2022 ?7:35 AM ?* No surgery found * ? ?Subjective: Still having pain in right groin, states that he needs his pain medicine increased ? ?Vitals:  ? 02/04/22 0645 02/04/22 0651  ?BP: (!) 126/56 (!) 126/56  ?Pulse: 72   ?Resp: 15   ?Temp:    ?SpO2: 96%   ? ? ?Physical Exam: ?Awake and alert and oriented ?Nonlabored respirations ?Right groin there is fibrinous exudate that is superficial with surrounding erythema, I cannot express any drainage ?Right transmetatarsal amputation healing well with sutures in place ? ?CBC ?   ?Component Value Date/Time  ? WBC 5.1 02/03/2022 1947  ? RBC 4.22 02/03/2022 1947  ? HGB 11.5 (L) 02/03/2022 1947  ? HCT 35.8 (L) 02/03/2022 1947  ? PLT 234 02/03/2022 1947  ? MCV 84.8 02/03/2022 1947  ? MCH 27.3 02/03/2022 1947  ? MCHC 32.1 02/03/2022 1947  ? RDW 13.5 02/03/2022 1947  ? LYMPHSABS 1.4 02/03/2022 1947  ? MONOABS 0.4 02/03/2022 1947  ? EOSABS 0.2 02/03/2022 1947  ? BASOSABS 0.0 02/03/2022 1947  ? ? ?BMET ?   ?Component Value Date/Time  ? NA 134 (L) 02/03/2022 1947  ? K 3.8 02/03/2022 1947  ? CL 99 02/03/2022 1947  ? CO2 28 02/03/2022 1947  ? GLUCOSE 313 (H) 02/03/2022 1947  ? BUN 16 02/03/2022 1947  ? CREATININE 1.26 (H) 02/03/2022 1947  ? CALCIUM 8.7 (L) 02/03/2022 1947  ? GFRNONAA >60 02/03/2022 1947  ? GFRAA >60 02/15/2019 0455  ? ? ?INR ?   ?Component Value Date/Time  ? INR 1.0 02/03/2022 1947  ? ? ? ?Intake/Output Summary (Last 24 hours) at 02/04/2022 0735 ?Last data filed at 02/04/2022 9798 ?Gross per 24 hour  ?Intake 100 ml  ?Output --  ?Net 100 ml  ? ? ? ?Assessment/plan:  66 y.o. male is s/p right iliofemoral endarterectomy with bovine pericardial patch angioplasty and transmetatarsal amputation now with superficial dehiscence and cellulitis of the right groin wound.  CT scan does not demonstrate any underlying fluid collection.  We will attempt to treat with local wound care and antibiotics.  Okay for diet from vascular standpoint. ? ? ?Reita Shindler C.  Randie Heinz, MD ?[facility] ?Office: [phone] ?Pager: [phone] ? ?02/04/2022 ?7:35 AM ? ?

## 2022-02-04 NOTE — Progress Notes (Signed)
?  Echocardiogram ?2D Echocardiogram has been performed. ? ?Delcie Roch ?02/04/2022, 4:46 PM ?

## 2022-02-04 NOTE — TOC Initial Note (Signed)
Transition of Care (TOC) - Initial/Assessment Note  ? ? ?Patient Details  ?Name: Antonio [name] [facility]. ?MRN: [id number] ?Date of Birth: 01-18-1956 ? ?Transition of Care (TOC) CM/SW Contact:    ?Lockie Pares, RN ?Phone Number: ?02/04/2022, 11:36 AM ? ?Clinical Narrative:                 ? ?Transition of Care Department [facility]) has reviewed patient and no TOC needs have been identified at this time. We will continue to monitor patient advancement through interdisciplinary progression rounds. If new patient transition needs arise, please place a TOC consult. ?  ?  ?  ?  ? ? ?Patient Goals and CMS Choice ?  ?  ?  ? ?Expected Discharge Plan and Services ?  ?  ?  ?  ?  ?                ?  ?  ?  ?  ?  ?  ?  ?  ?  ?  ? ?Prior Living Arrangements/Services ?  ?  ?  ?       ?  ?  ?  ?  ? ?Activities of Daily Living ?  ?  ? ?Permission Sought/Granted ?  ?  ?   ?   ?   ?   ? ?Emotional Assessment ?  ?  ?  ?  ?  ?  ? ?Admission diagnosis:  Wound dehiscence [T81.30XA] ?Patient Active Problem List  ? Diagnosis Date Noted  ? Wound dehiscence 02/03/2022  ? AKI (acute kidney injury) (HCC)   ? Benign essential HTN   ? Uncontrolled type 2 diabetes mellitus with hyperglycemia (HCC)   ? S/P transmetatarsal amputation of foot, 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/09/2022  ? Preoperative cardiovascular examination   ? PVD (peripheral vascular disease) (HCC)   ? Osteomyelitis (HCC) 12/28/2021  ? Airway trauma   ? Acute respiratory failure (HCC)   ? Dysphagia   ? Status post tracheostomy (HCC)   ? HCAP (healthcare-associated pneumonia)   ? Diabetes mellitus type 2 in nonobese [facility])   ? Hypertension   ? S/P BKA (below knee amputation) unilateral, left (HCC)   ? Tachypnea   ? Pain   ? Agitation   ? Acute blood loss anemia   ? Phlegm in throat   ? Fever, unspecified   ? Tachycardia   ? Acute on chronic respiratory failure (HCC)   ? Hypoxemia   ? Acute pulmonary edema (HCC)   ? Hypoxia   ? Tracheostomy status (HCC)   ? Pneumonia   ? Influenza A virus present   ?  Pressure injury of skin 12/01/2018  ? SIRS (systemic inflammatory response syndrome) (HCC) 11/28/2018  ? Acute respiratory failure with hypoxemia (HCC) 11/28/2018  ? Influenza A   ? Hyponatremia   ? Chest pain 05/02/2018  ? Hyperlipidemia 05/02/2018  ? Tobacco abuse 05/02/2018  ? Coronary artery disease involving native coronary artery of native heart with angina pectoris (HCC) 05/02/2018  ? S/P transmetatarsal amputation of foot, left (HCC) 04/15/2017  ? Amputated great toe of right foot (HCC) 04/15/2017  ? Ulcer of toe of left foot, limited to breakdown of skin (HCC) 04/04/2017  ? Partial nontraumatic amputation of foot, right (HCC) 04/04/2017  ? Type 2 diabetes mellitus with hyperglycemia, with long-term current use of insulin (HCC)   ? Edema   ? ?PCP:  Dema Severin, NP ?Pharmacy:   ?[facility] [place] - [address] ?  [address] ?[facility] [place] [address] ?Phone: [phone] Fax: [phone] ? ?[facility] ?[address] ?[place] [address] ?Phone: [phone] Fax: [phone] ? ? ? ? ?Social Determinants of Health (SDOH) Interventions ?  ? ?Readmission Risk Interventions ?Readmission Risk Prevention Plan 01/09/2022  ?Transportation Screening Complete  ?PCP or Specialist Appt within 5-7 Days (No Data)  ?Home Care Screening Complete  ?Medication Review (RN CM) Complete  ?Some recent data might be hidden  ? ? ? ?

## 2022-02-04 NOTE — Progress Notes (Signed)
Pharmacy Antibiotic Note ? ?Antonio [name]. is a 66 y.o. male admitted on 02/03/2022 with  wound infection .  Pharmacy has been consulted for cefepime and vancomycin dosing. ? ?Cr up slightly 1.26>1.54. ? ?Plan: ?Adjust vancomycin to 1750mg  IV q24h - est AUC 540 ? ?  ? ?Temp (24hrs), Avg:98.3 ?F (36.8 ?C), Min:98.1 ?F (36.7 ?C), Max:98.5 ?F (36.9 ?C) ? ?Recent Labs  ?Lab 02/03/22 ?1947 02/04/22 ?1826  ?WBC 5.1  --   ?CREATININE 1.26* 1.54*  ?LATICACIDVEN 1.8  --   ? ?  ?CrCl cannot be calculated (Unknown ideal weight.).   ? ?Allergies  ?Allergen Reactions  ? Iodinated Contrast Media Hives, Rash and Other (See Comments)  ?  Blisters ?Staph ?Blisters / staph  ? Penicillins Other (See Comments)  ?  UNSPECIFIED REACTION  ?Has patient had a PCN reaction causing immediate rash, facial/tongue/throat swelling, SOB or lightheadedness with hypotension: No ?Has patient had a PCN reaction causing severe rash involving mucus membranes or skin necrosis: No ?Has patient had a PCN reaction that required hospitalization: No ?Has patient had a PCN reaction occurring within the last 10 years: No ?If all of the above answers are "NO", then may proceed with Cephalosporin use.  ? ? ?Antimicrobials this admission: ?flagyl 3/11 >>  ?vancomycin 3/11 >>  ?cefepime 3/11 >>  ? ?Microbiology results: ?Pending ? ?Thank you for allowing pharmacy to be a part of this patient?s care. ? ?5/11, PharmD, BCPS, BCCP ?Clinical Pharmacist ?[phone] ?Please check AMION for all [facility] Pharmacy numbers ?02/04/2022 ? ? ? ?

## 2022-02-04 NOTE — Progress Notes (Signed)
? ?Subjective:  ? ?Hospital day: 1 ? ?Overnight event:  ? ?Patient evaluated at the bedside sitting in bed eating breakfast. Patient endorses some pain in his groin has not significantly improved. denied any fever or chills.  Reports the swelling in his right leg has improved slightly.  Patient O2 dropped to the high 80s off O2 supplementation.  Patient placed on 3 L [place] with improvement O2 to 94%.  Denies shortness of breath but endorsed occasional cough. ? ?Objective: ? ?Vital signs in last 24 hours: ?Vitals:  ? 02/04/22 1000 02/04/22 1100 02/04/22 1200 02/04/22 1430  ?BP: (!) 143/71 110/65 135/68 105/60  ?Pulse: 77 72 71 73  ?Resp: 18 20 18 14   ?Temp:      ?TempSrc:      ?SpO2: 97% 95% 93% 92%  ? ? ?There were no vitals filed for this visit. ? ? ?Intake/Output Summary (Last 24 hours) at 02/04/2022 1441 ?Last data filed at 02/04/2022 04/06/2022 ?Gross per 24 hour  ?Intake 100 ml  ?Output --  ?Net 100 ml  ? ?Net IO Since Admission: 100 mL [02/04/22 1441] ? ?Recent Labs  ?  02/04/22 ?0745 02/04/22 ?1135  ?GLUCAP 164* 221*  ?  ? ?Pertinent Labs: ?CBC Latest Ref Rng & Units 02/03/2022 01/22/2022 01/17/2022  ?WBC 4.0 - 10.5 K/uL 5.1 5.8 6.0  ?Hemoglobin 13.0 - 17.0 g/dL 11.5(L) 10.3(L) 10.0(L)  ?Hematocrit 39.0 - 52.0 % 35.8(L) 32.2(L) 31.6(L)  ?Platelets 150 - 400 K/uL 234 282 265  ? ? ?CMP Latest Ref Rng & Units 02/03/2022 01/22/2022 01/17/2022  ?Glucose 70 - 99 mg/dL 01/19/2022) 960(A) 540(J)  ?BUN 8 - 23 mg/dL 16 19 811(B)  ?Creatinine 0.61 - 1.24 mg/dL 14(N) 8.29(F) 6.21(H)  ?Sodium 135 - 145 mmol/L 134(L) 140 140  ?Potassium 3.5 - 5.1 mmol/L 3.8 3.7 3.9  ?Chloride 98 - 111 mmol/L 99 97(L) 98  ?CO2 22 - 32 mmol/L 28 35(H) 31  ?Calcium 8.9 - 10.3 mg/dL 0.86(V) 8.1(L) 8.4(L)  ?Total Protein 6.5 - 8.1 g/dL 7.2 7.0 -  ?Total Bilirubin 0.3 - 1.2 mg/dL 7.8(I) 0.3 -  ?Alkaline Phos 38 - 126 U/L 97 91 -  ?AST 15 - 41 U/L 14(L) 12(L) -  ?ALT 0 - 44 U/L 12 12 -  ? ? ?Imaging: ?DG Chest 2 View ? ?Result Date: 02/03/2022 ?CLINICAL DATA:  Recent  groin surgery, having drainage in foul odor, history diabetes mellitus, hypertension, former smoker, coronary artery disease, suspected sepsis EXAM: CHEST - 2 VIEW COMPARISON:  01/15/2022 FINDINGS: Upper normal heart size with slight pulmonary vascular congestion. Normal mediastinal contours. Bibasilar atelectasis improved from previous exam. Upper lungs clear. No additional infiltrate, pleural effusion, or pneumothorax. Bones demineralized. IMPRESSION: Improved bibasilar atelectasis. Electronically Signed   By: 01/17/2022 M.D.   On: 02/03/2022 20:08  ? ?CT PELVIS WO CONTRAST ? ?Result Date: 02/03/2022 ?CLINICAL DATA:  Soft tissue infection suspected. EXAM: CT PELVIS WITHOUT CONTRAST TECHNIQUE: Multidetector CT imaging of the pelvis was performed following the standard protocol without intravenous contrast. RADIATION DOSE REDUCTION: This exam was performed according to the departmental dose-optimization program which includes automated exposure control, adjustment of the mA and/or kV according to patient size and/or use of iterative reconstruction technique. COMPARISON:  None. FINDINGS: Evaluation of this exam is limited in the absence of intravenous contrast. Urinary Tract:  No abnormality visualized. Bowel:  Unremarkable visualized pelvic bowel loops. Vascular/Lymphatic: Moderate aortoiliac atherosclerotic disease. No pelvic adenopathy. Reproductive: The prostate and seminal vesicles are grossly unremarkable. Other: There is  laceration of the skin over the right groin. There is a 5 mm radiopaque foreign object approximately 5 mm deep to the skin laceration. There is induration of the surrounding subcutaneous soft tissues with several mildly reactive lymph nodes. No drainable fluid collection or abscess. Musculoskeletal: Mild degenerative changes. No acute osseous pathology. IMPRESSION: 1. Laceration of the skin over the right groin with a 5 mm radiopaque foreign object approximately 5 mm deep to the skin  laceration. No drainable fluid collection or abscess. 2. Aortic Atherosclerosis (ICD10-I70.0). Electronically Signed   By: Elgie Collard M.D.   On: 02/03/2022 22:25   ? ?Physical Exam ? ?General: Pleasant, well-appearing elderly man sitting in bed. No acute distress. ?CV: RRR. No m/r/g. No LE edema ?Pulmonary: On 3L [place]. Lungs CTAB. Normal effort. No wheezing or rales. ?Abdominal: Soft, nontender, nondistended. Normal bowel sounds. ?GU: Right groin wound with some exudate and surrounding erythema but no drainage. ?Extremities: Healing R transmetatarsal amputation stable sutures. RLE with 1+ edema w/ associated thickening and scaling. Left AKA ?Skin: Warm and dry. No obvious rash or lesions. ?Neuro: A&Ox3. No focal deficts ?Psych: Normal mood and affect ? ? ?Assessment/Plan: ?Antonio [name]. is a 66 y.o. male with hx of  HTN, T2DM, CAD, PAD s/p s/p R iliofemoral endarterectomy with angioplasty and transmetatarsal amputation admitted for superficial dehiscence and cellulitis of the right groin wound.  ? ?Principal Problem: ?  Wound dehiscence ? ?#Cellulitis of R inguinal wound s/p R right iliofemoral endarterectomy  ?Wound shows superficial exudate and erythema consistent with cellulitis.  CT negative for underlying fluid collection. Vascular evaluated patient and recommended continue antibiotics and local wound care.  Blood cultures no growth to date. No leukocytosis and normal lactic acid. ?--Continue cefepime and Vanc, de-escalate tomorrow ?--Wound care ?--Pain control with dilaudid 1 mg q 4hrs prn for pain; avoid morphine  ?--Follow-up blood cultures ? ?#Acute hypoxic respiratory failure ?Patient requiring O2 supplementation to maintain O2 above 90%.  He denies any shortness of breath and states he is unable to lay flat due to his amputation.  No rales on auscultation of lung.  O2 need likely secondary to COPD in the setting of patient's smoking history.  Echo today shows EF 55-60% and no valvular  abnormalities. ?--Continue O2 supplementation ?--Albuterol inhaler as needed ?  ?#CAD ?#PAD ?#HLD ?Chronic issue for patient. Home meds include aspirin 81 mg and Plavix 75 mg. Lipitor 40 mg for her HLD. Patient unsure while he is on Plavix but states he takes other medications prescribed to him.  ?--Continue to hold Plavix for now ?--Continue Aspirin 81 mg and Lipitor 40 mg.  ?  ?#HTN ?Chronic. Home meds include Norvasc 10 mg, Coreg 25 mg BID, irbesartan 75 mg qd, hydralazine 25 mg TID.  BP remains relatively soft with SBP 100s to 120s.  We will continue to monitor closely ?--Continue amlodipine, hydralazine, Coreg and irbesartan ?  ?#T2DM ?Recent A1c 1 month ago 11.3%. Home meds include insulin detemir 24 units qhs.  Patient would likely benefit from antidiabetic medication in addition to insulin.  CBGs 130s to 221 throughout the day.  ?--Continue Semglee 20 units at bedtime ?--Continue SSI ?--Patient will need better control of diabetes to help improve wound healing in the outpatient ? ?#GERD ?Chronic.Home med includes protonix 40 mg. ?-Continue home meds ?  ?#Tobacco use disorder ?Patient has 50 pack year smoking history.  ?-Counsel on smoking cessation ? ?Diet: Carb modified ?IVF: None ?VTE: Lovenox ?CODE: Full ? ?Prior to Admission Living  Arrangement: Home ?Anticipated Discharge Location: Home ?Barriers to Discharge: Medical stability ?Dispo: Anticipated discharge in approximately 1-2 day(s).  ? ?Signed: ?Steffanie RainwaterAmponsah, Boni Maclellan M, MD ?02/04/2022, 2:41 PM  ?Pager: [phone] ?Internal Medicine Teaching Service ?After 5pm on weekdays and 1pm on weekends: On Call pager: [phone] ? ?

## 2022-02-05 ENCOUNTER — Other Ambulatory Visit: Payer: Self-pay

## 2022-02-05 ENCOUNTER — Encounter: Payer: Medicaid Other | Attending: Registered Nurse | Admitting: Registered Nurse

## 2022-02-05 ENCOUNTER — Other Ambulatory Visit (HOSPITAL_COMMUNITY): Payer: Self-pay

## 2022-02-05 DIAGNOSIS — J449 Chronic obstructive pulmonary disease, unspecified: Secondary | ICD-10-CM | POA: Diagnosis not present

## 2022-02-05 DIAGNOSIS — E785 Hyperlipidemia, unspecified: Secondary | ICD-10-CM

## 2022-02-05 DIAGNOSIS — I10 Essential (primary) hypertension: Secondary | ICD-10-CM

## 2022-02-05 DIAGNOSIS — L03314 Cellulitis of groin: Secondary | ICD-10-CM | POA: Diagnosis not present

## 2022-02-05 DIAGNOSIS — E1151 Type 2 diabetes mellitus with diabetic peripheral angiopathy without gangrene: Secondary | ICD-10-CM

## 2022-02-05 DIAGNOSIS — J9601 Acute respiratory failure with hypoxia: Secondary | ICD-10-CM | POA: Diagnosis not present

## 2022-02-05 DIAGNOSIS — I2510 Atherosclerotic heart disease of native coronary artery without angina pectoris: Secondary | ICD-10-CM

## 2022-02-05 DIAGNOSIS — T8130XA Disruption of wound, unspecified, initial encounter: Secondary | ICD-10-CM | POA: Diagnosis not present

## 2022-02-05 LAB — BASIC METABOLIC PANEL
Anion gap: 9 (ref 5–15)
BUN: 30 mg/dL — ABNORMAL HIGH (ref 8–23)
CO2: 26 mmol/L (ref 22–32)
Calcium: 8.7 mg/dL — ABNORMAL LOW (ref 8.9–10.3)
Chloride: 98 mmol/L (ref 98–111)
Creatinine, Ser: 1.82 mg/dL — ABNORMAL HIGH (ref 0.61–1.24)
GFR, Estimated: 41 mL/min — ABNORMAL LOW (ref >60)
Glucose, Bld: 186 mg/dL — ABNORMAL HIGH (ref 70–99)
Potassium: 4.4 mmol/L (ref 3.5–5.1)
Sodium: 133 mmol/L — ABNORMAL LOW (ref 135–145)

## 2022-02-05 LAB — GLUCOSE, CAPILLARY
Glucose-Capillary: 194 mg/dL — ABNORMAL HIGH (ref 70–99)
Glucose-Capillary: 152 mg/dL — ABNORMAL HIGH (ref 70–99)
Glucose-Capillary: 203 mg/dL — ABNORMAL HIGH (ref 70–99)
Glucose-Capillary: 172 mg/dL — ABNORMAL HIGH (ref 70–99)

## 2022-02-05 MED ORDER — VANCOMYCIN HCL 1250 MG/250ML IV SOLN
1250.0000 mg | INTRAVENOUS | Status: DC
  Administered 2022-02-05: 1250 mg via INTRAVENOUS
  Filled 2022-02-05: qty 250

## 2022-02-05 MED ORDER — OXYCODONE-ACETAMINOPHEN 5-325 MG PO TABS: 1.0000 | ORAL_TABLET | ORAL | Status: DC | PRN

## 2022-02-05 MED ORDER — SODIUM CHLORIDE 0.9 % IV SOLN
1.0000 g | INTRAVENOUS | Status: DC
  Administered 2022-02-05 – 2022-02-10 (×6): 1 g via INTRAVENOUS
  Filled 2022-02-05 (×9): qty 10

## 2022-02-05 MED ORDER — SODIUM CHLORIDE 0.9 % IV SOLN: 2.0000 g | Freq: Two times a day (BID) | INTRAVENOUS | Status: DC

## 2022-02-05 MED ORDER — HYDROMORPHONE HCL 1 MG/ML IJ SOLN
1.0000 mg | INTRAMUSCULAR | Status: DC | PRN
Start: 2022-02-05 — End: 2022-02-06
  Administered 2022-02-05 – 2022-02-06 (×10): 1 mg via INTRAVENOUS
  Filled 2022-02-05 (×10): qty 1

## 2022-02-05 NOTE — Progress Notes (Signed)
?  Progress Note ? ? ? ?02/05/2022 ?7:54 AM ?* No surgery found * ? ?Subjective:  soreness R groin ? ? ?Vitals:  ? 02/04/22 1958 02/05/22 0422  ?BP: 100/65 128/68  ?Pulse: 65 79  ?Resp: 20 18  ?Temp: 98.5 ?F (36.9 ?C) 99.6 ?F (37.6 ?C)  ?SpO2: (!) 89% (!) 85%  ? ?Physical Exam: ?Lungs:  non labored ?Incisions:  R groin incision dehiscence with surrounding erythema, no purulence ?Extremities:  R TMA with dry eschar mid and lateral incision ?Neurologic: a&O ? ?CBC ?   ?Component Value Date/Time  ? WBC 5.1 02/03/2022 1947  ? RBC 4.22 02/03/2022 1947  ? HGB 11.5 (L) 02/03/2022 1947  ? HCT 35.8 (L) 02/03/2022 1947  ? PLT 234 02/03/2022 1947  ? MCV 84.8 02/03/2022 1947  ? MCH 27.3 02/03/2022 1947  ? MCHC 32.1 02/03/2022 1947  ? RDW 13.5 02/03/2022 1947  ? LYMPHSABS 1.4 02/03/2022 1947  ? MONOABS 0.4 02/03/2022 1947  ? EOSABS 0.2 02/03/2022 1947  ? BASOSABS 0.0 02/03/2022 1947  ? ? ?BMET ?   ?Component Value Date/Time  ? NA 133 (L) 02/04/2022 1826  ? K 4.4 02/04/2022 1826  ? CL 98 02/04/2022 1826  ? CO2 26 02/04/2022 1826  ? GLUCOSE 117 (H) 02/04/2022 1826  ? BUN 26 (H) 02/04/2022 1826  ? CREATININE 1.54 (H) 02/04/2022 1826  ? CALCIUM 8.5 (L) 02/04/2022 1826  ? GFRNONAA 50 (L) 02/04/2022 1826  ? GFRAA >60 02/15/2019 0455  ? ? ?INR ?   ?Component Value Date/Time  ? INR 1.0 02/03/2022 1947  ? ? ? ?Intake/Output Summary (Last 24 hours) at 02/05/2022 0754 ?Last data filed at 02/04/2022 1520 ?Gross per 24 hour  ?Intake 100 ml  ?Output --  ?Net 100 ml  ? ? ? ?Assessment/Plan:  66 y.o. male is s/p R iliofemoral endarterectomy with bovine patch angioplasty and R TMA * No surgery found *  ? ?R foot warm and well perfused ?R TMA looks ok, can probably remove staples closure to discharge; reminded patient he should be heel weightbearing only ?R groin with surrounding erythema however no fluid collection or purulent drainage; continue wet to dry packing daily; continue broad spectrum IV antibiotics ? ? ?Emilie Rutter, PA-C ?[facility] ?[phone] ?02/05/2022 ?7:54 AM ? ? ? ?

## 2022-02-05 NOTE — Plan of Care (Signed)
  Problem: Education: Goal: Knowledge of General Education information will improve Description: Including pain rating scale, medication(s)/side effects and non-pharmacologic comfort measures Outcome: Progressing   Problem: Clinical Measurements: Goal: Ability to maintain clinical measurements within normal limits will improve Outcome: Progressing   

## 2022-02-05 NOTE — Progress Notes (Signed)
Pharmacy Antibiotic Note ? ?Antonio [name]. is a 66 y.o. male admitted on 02/03/2022 with R groin infection.  Pharmacy has been consulted for cefepime and vancomycin dosing. ? ?SCr continues trend upward to 1.82, UOP not charted ? ?Plan: ?Change Cefepime to 2gm IV q12h ?Change vancomycin to 1250mg  IV q24h (est AUC 447, SCr 1.82) ?Consider vancomycin trough if SCr up further tomorrow to help guide dosing ?F/u BMET in a.m. ?F/u micro data and pt's clinical condition and de-escalation of abx ? ?Weight: 93 kg (205 lb 0.4 oz) ? ?Temp (24hrs), Avg:99.1 ?F (37.3 ?C), Min:98.5 ?F (36.9 ?C), Max:99.6 ?F (37.6 ?C) ? ?Recent Labs  ?Lab 02/03/22 ?1947 02/04/22 ?1826 02/05/22 ?02/07/22  ?WBC 5.1  --   --   ?CREATININE 1.26* 1.54* 1.82*  ?LATICACIDVEN 1.8  --   --   ? ?  ?Estimated Creatinine Clearance: 45 mL/min (A) (by C-G formula based on SCr of 1.82 mg/dL (H)).   ? ?Allergies  ?Allergen Reactions  ? Iodinated Contrast Media Hives, Rash and Other (See Comments)  ?  Blisters ?Staph ?Blisters / staph  ? Penicillins Other (See Comments)  ?  UNSPECIFIED REACTION  ?Has patient had a PCN reaction causing immediate rash, facial/tongue/throat swelling, SOB or lightheadedness with hypotension: No ?Has patient had a PCN reaction causing severe rash involving mucus membranes or skin necrosis: No ?Has patient had a PCN reaction that required hospitalization: No ?Has patient had a PCN reaction occurring within the last 10 years: No ?If all of the above answers are "NO", then may proceed with Cephalosporin use.  ? ? ?Antimicrobials this admission: ?flagyl 3/11 >>  ?vancomycin 3/11 >>  ?cefepime 3/11 >>  ? ?Microbiology results: ?3/11 BCx: ngtd ? ?5/11, PharmD, BCPS ?Please see amion for complete clinical pharmacist phone list ?02/05/2022 ? ? ? ?

## 2022-02-05 NOTE — Progress Notes (Addendum)
?  Progress Note ? ? ? ?02/05/2022 ?6:29 PM ?* No surgery found * ? ?Subjective:  soreness R groin ? ? ?Vitals:  ? 02/05/22 0422 02/05/22 1449  ?BP: 128/68 (!) 95/55  ?Pulse: 79 62  ?Resp: 18 18  ?Temp: 99.6 ?F (37.6 ?C) 97.8 ?F (36.6 ?C)  ?SpO2: (!) 85% 93%  ? ?Physical Exam: ?Lungs:  non labored ?Incisions:  R groin incision dehiscence with surrounding erythema, no purulence ?Extremities:  R TMA with dry eschar mid and lateral incision ?Neurologic: a&O ? ?CBC ?   ?Component Value Date/Time  ? WBC 5.1 02/03/2022 1947  ? RBC 4.22 02/03/2022 1947  ? HGB 11.5 (L) 02/03/2022 1947  ? HCT 35.8 (L) 02/03/2022 1947  ? PLT 234 02/03/2022 1947  ? MCV 84.8 02/03/2022 1947  ? MCH 27.3 02/03/2022 1947  ? MCHC 32.1 02/03/2022 1947  ? RDW 13.5 02/03/2022 1947  ? LYMPHSABS 1.4 02/03/2022 1947  ? MONOABS 0.4 02/03/2022 1947  ? EOSABS 0.2 02/03/2022 1947  ? BASOSABS 0.0 02/03/2022 1947  ? ? ?BMET ?   ?Component Value Date/Time  ? NA 133 (L) 02/05/2022 0929  ? K 4.4 02/05/2022 0929  ? CL 98 02/05/2022 0929  ? CO2 26 02/05/2022 0929  ? GLUCOSE 186 (H) 02/05/2022 0929  ? BUN 30 (H) 02/05/2022 0929  ? CREATININE 1.82 (H) 02/05/2022 0929  ? CALCIUM 8.7 (L) 02/05/2022 0929  ? GFRNONAA 41 (L) 02/05/2022 0929  ? GFRAA >60 02/15/2019 0455  ? ? ?INR ?   ?Component Value Date/Time  ? INR 1.0 02/03/2022 1947  ? ? ? ?Intake/Output Summary (Last 24 hours) at 02/05/2022 1829 ?Last data filed at 02/05/2022 1620 ?Gross per 24 hour  ?Intake 406.27 ml  ?Output 600 ml  ?Net -193.73 ml  ? ? ? ? ?Assessment/Plan:  66 y.o. male is s/p R iliofemoral endarterectomy with bovine patch angioplasty and R TMA * No surgery found *  ? ?R foot warm and well perfused ?R TMA looks ok, can probably remove staples closure to discharge; reminded patient he should be heel weightbearing only ?R groin with surrounding erythema however no fluid collection or purulent drainage; continue wet to dry packing daily; continue broad spectrum IV antibiotics ? ? ?Emilie Rutter,  PA-C ?[facility] ?[phone] ?02/05/2022 ?6:29 PM ? ?VASCULAR STAFF ADDENDUM: ?I have independently interviewed and examined the patient. ?I agree with the above.  ?Right groin wound with superficial dehiscence, cellulitis nearly 6 weeks from intervention ?TMA appears to be healing appropriately. ?CT with no abscess, fluid ?Ravinder was at high risk of wound breakdown at time of surgery due to body habitus, poor hygiene, elevated A1C. ?Please continue broad-spectrum antibiotics.  He has been noncompliant with wet-to-dry dressings to the wound bed. ?I will continue to follow his progression over the coming days.   ?Possible washout / debridement Thursday. ? ?J. Gillis Santa, MD ?[facility] ?Office Phone Number: [phone] ?02/05/2022 6:29 PM ? ? ? ?

## 2022-02-05 NOTE — Progress Notes (Signed)
PT Cancellation Note ? ?Patient Details ?Name: Antonio [name] [facility]. ?MRN: [id number] ?DOB: Jun 07, 1956 ? ? ?Cancelled Treatment:    Reason Eval/Treat Not Completed: Other (comment).  Pt is voicing that he is fine, does not need PT assist to transfer or manage at home.  Was on wheelchair all day and reports he can transfer fine.  Will recheck tomorrow if pt is still here. ? ? ?[name] ?02/05/2022, 11:12 AM ? ?[name], PT PhD ?Acute Rehab Dept. Number: [facility] [phone] and [facility] [phone] ? ?

## 2022-02-05 NOTE — Progress Notes (Addendum)
? ?HD#2 ?SUBJECTIVE:  ?Patient Summary: Antonio [name]. is a 66 y.o. 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  ? ?Overnight Events: No acute events overnight ? ?Interim History: This is hospital day 2 for this patient who was seen and evaluated at the bedside this morning. He states that he vascular surgeons increased his pain meds to q3h as he was still in a lot of pain. He is eating and drinking well. He has no SOB, but is raspy, and notes that this is from having a trach a few years ago.  ? ?OBJECTIVE:  ?Vital Signs: ?Vitals:  ? 02/04/22 1200 02/04/22 1430 02/04/22 1958 02/05/22 0422  ?BP: 135/68 105/60 100/65 128/68  ?Pulse: 71 73 65 79  ?Resp: 18 14 20 18   ?Temp:   98.5 ?F (36.9 ?C) 99.6 ?F (37.6 ?C)  ?TempSrc:   Oral Oral  ?SpO2: 93% 92% (!) 89% (!) 85%  ? ?Supplemental O2: Room Air ?SpO2: (!) 85 % ?O2 Flow Rate (L/min): 4 L/min ? ?There were no vitals filed for this visit. ? ? ?Intake/Output Summary (Last 24 hours) at 02/05/2022 0643 ?Last data filed at 02/04/2022 1520 ?Gross per 24 hour  ?Intake 200 ml  ?Output --  ?Net 200 ml  ? ?Net IO Since Admission: 200 mL [02/05/22 0643] ? ?Physical Exam: ?General: Pleasant, well-appearing elderly laying in bed. No acute distress. ?CV: RRR. No murmurs, rubs, or gallops. No LE edema ?Pulmonary: Lungs CTAB. Normal effort. No wheezing or rales. ?Abdominal: Soft, nontender, nondistended. Normal bowel sounds. ?Extremities: R transmetatarsal amputation. S/p L AKA.  ?Skin: Warm and dry. R groin wound with some exudate and purulence, but no active drainage/fluid collection.  ?Neuro: A&Ox3. Moves all extremities. Normal sensation. No focal deficit. ?Psych: Normal mood and affect ? ? ? ? ?ASSESSMENT/PLAN:  ?Assessment: ?Principal Problem: ?  Wound  dehiscence ?Active Problems: ?  Wound infection after surgery ?  Acute heart failure with preserved ejection fraction (HFpEF) (HCC) ? ? ?Plan: ?#Cellulitis of right inguinal wound ?Wound with superficial exudate and erythema, consistent with cellulitis. CT negative for fluid collection but did show 5 mm radiopaque foreign object approximately 5 mm deep to skin laceration. Blood cx NGTD. Vascular surgery has been consulted and is not planning any interventions at this time.  ?- Continue vanc, discontinue cefepime and start Rocephin in place ?- Dilaudid 1 mg q3h prn for severe pain ?- Percocet 1-2 tablets q4h prn for moderate pain ?- Wound care ?- Continue wet to dry packing ? ?#Acute hypoxic respiratory failure ?#COPD ?Patient continuing to require supplemental o2 to maintain sats >90%. Denies SOB. Likely secondary to COPD in the setting of patient's tobacco use history.  ?- Albuterol prn ?- Maintain SpO2 >90% ?- Likely will need outpatient PFTs ? ?#CAD ?#PAD ?#HLD ?Aspirin 81 mg and plavix 75 mg fdaily. Lipitor 40 mg.  ?- Hold plavix ?- Continue aspirin and lipitor ? ?#Hypertension ?On amlodipine 10 mg, coreg 25 mg bid, irbesartan 75 mg, hydralazine 75 mg tid at home. SBP 120s this morning.  ?- Continue home meds ? ?#Type 2 diabetes ?A1c 11.3 one month ago. On insulin determir 24u qhs. CBG 117-221 over the last 24 hrs. ?- Semglee 20u qhs ?- SSI ? ? ?Best Practice: ?Diet: Diabetic diet ?IVF: Fluids: none ?VTE: enoxaparin (LOVENOX) injection  40 mg Start: 02/04/22 2200 ?Code: Full ?AB: Vanc, Rocephin ?Therapy Recs: None ?DISPO: Anticipated discharge in 1-3 days to Home pending Medical stability. ? ?Signature: ?Sylvanus Telford, D.O.  ?Internal Medicine Resident, PGY-1 ?[facility]  ?Pager: [phone] ?6:43 AM, 02/05/2022  ? ?Please contact the on call pager after 5 pm and on weekends at [phone]. ? ?

## 2022-02-06 LAB — BASIC METABOLIC PANEL
Anion gap: 10 (ref 5–15)
BUN: 41 mg/dL — ABNORMAL HIGH (ref 8–23)
CO2: 22 mmol/L (ref 22–32)
Calcium: 8.6 mg/dL — ABNORMAL LOW (ref 8.9–10.3)
Chloride: 99 mmol/L (ref 98–111)
Creatinine, Ser: 2.01 mg/dL — ABNORMAL HIGH (ref 0.61–1.24)
GFR, Estimated: 36 mL/min — ABNORMAL LOW (ref >60)
Glucose, Bld: 165 mg/dL — ABNORMAL HIGH (ref 70–99)
Potassium: 4.3 mmol/L (ref 3.5–5.1)
Sodium: 131 mmol/L — ABNORMAL LOW (ref 135–145)

## 2022-02-06 LAB — URINALYSIS, COMPLETE (UACMP) WITH MICROSCOPIC
Bilirubin Urine: NEGATIVE
Glucose, UA: NEGATIVE mg/dL
Ketones, ur: 5 mg/dL — AB
Nitrite: NEGATIVE
Protein, ur: 100 mg/dL — AB
Specific Gravity, Urine: 1.019 (ref 1.005–1.030)
pH: 5 (ref 5.0–8.0)

## 2022-02-06 LAB — GLUCOSE, CAPILLARY
Glucose-Capillary: 164 mg/dL — ABNORMAL HIGH (ref 70–99)
Glucose-Capillary: 221 mg/dL — ABNORMAL HIGH (ref 70–99)
Glucose-Capillary: 173 mg/dL — ABNORMAL HIGH (ref 70–99)
Glucose-Capillary: 166 mg/dL — ABNORMAL HIGH (ref 70–99)

## 2022-02-06 LAB — CBC
HCT: 32 % — ABNORMAL LOW (ref 39.0–52.0)
Hemoglobin: 10.5 g/dL — ABNORMAL LOW (ref 13.0–17.0)
MCH: 27.6 pg (ref 26.0–34.0)
MCHC: 32.8 g/dL (ref 30.0–36.0)
MCV: 84 fL (ref 80.0–100.0)
Platelets: 185 10*3/uL (ref 150–400)
RBC: 3.81 MIL/uL — ABNORMAL LOW (ref 4.22–5.81)
RDW: 14 % (ref 11.5–15.5)
WBC: 5.4 10*3/uL (ref 4.0–10.5)
nRBC: 0 % (ref 0.0–0.2)

## 2022-02-06 LAB — NA AND K (SODIUM & POTASSIUM), RAND UR
Potassium Urine: 34 mmol/L
Sodium, Ur: 44 mmol/L

## 2022-02-06 LAB — CREATININE, URINE, RANDOM: Creatinine, Urine: 147.73 mg/dL

## 2022-02-06 LAB — VANCOMYCIN, TROUGH: Vancomycin Tr: 22 ug/mL (ref 15–20)

## 2022-02-06 MED ORDER — VANCOMYCIN VARIABLE DOSE PER UNSTABLE RENAL FUNCTION (PHARMACIST DOSING): Status: DC

## 2022-02-06 MED ORDER — HYDROMORPHONE HCL 1 MG/ML IJ SOLN
0.5000 mg | INTRAMUSCULAR | Status: DC | PRN
  Administered 2022-02-06: 0.5 mg via INTRAVENOUS
  Filled 2022-02-06: qty 0.5

## 2022-02-06 MED ORDER — VANCOMYCIN HCL IN DEXTROSE 1-5 GM/200ML-% IV SOLN
1000.0000 mg | INTRAVENOUS | Status: DC
  Administered 2022-02-07: 1000 mg via INTRAVENOUS
  Filled 2022-02-06 (×2): qty 200

## 2022-02-06 MED ORDER — OXYCODONE-ACETAMINOPHEN 5-325 MG PO TABS
1.0000 | ORAL_TABLET | ORAL | Status: DC
  Administered 2022-02-06 – 2022-02-09 (×15): 1 via ORAL
  Filled 2022-02-06 (×15): qty 1

## 2022-02-06 MED ORDER — LACTATED RINGERS IV SOLN: INTRAVENOUS | Status: AC

## 2022-02-06 NOTE — Progress Notes (Signed)
? ?HD#3 ?SUBJECTIVE:  ?Patient Summary: Antonio Primo. is a 66 y.o. 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  ? ?Overnight Events: No acute events overnight ? ?Interim History: This is hospital day 3 for this patient. He states that he is still having a lot of pain at his wound site. He is eating and drinking just fine. Has no shortness of breath and is about to ambulate with PT.  ? ?OBJECTIVE:  ?Vital Signs: ?Vitals:  ? 02/05/22 1449 02/05/22 2050 02/05/22 2149 02/06/22 0248  ?BP: (!) 95/55 (!) 101/56  (!) 113/98  ?Pulse: 62 63  65  ?Resp: 18 18  20   ?Temp: 97.8 ?F (36.6 ?C) 98.9 ?F (37.2 ?C)  98.2 ?F (36.8 ?C)  ?TempSrc: Oral Oral  Oral  ?SpO2: 93% 93%  90%  ?Weight:      ?Height:   6' (1.829 m)   ? ?Supplemental O2: Room Air ?SpO2: 90 % ?O2 Flow Rate (L/min): 4 L/min ? ?Filed Weights  ? 02/05/22 1000  ?Weight: 93 kg  ? ? ? ?Intake/Output Summary (Last 24 hours) at 02/06/2022 0732 ?Last data filed at 02/06/2022 0310 ?Gross per 24 hour  ?Intake 710.07 ml  ?Output 900 ml  ?Net -189.93 ml  ? ?Net IO Since Admission: 10.07 mL [02/06/22 0732] ? ?Physical Exam: ?General: Pleasant, well-appearing elderly male laying in bed. No acute distress. ?CV: RRR. No murmurs, rubs, or gallops.  ?Pulmonary: Lungs CTAB. Normal effort. No wheezing or rales. ?Abdominal: Soft, nontender, nondistended. Normal bowel sounds. ?Extremities: R transmetatarsal amputation. S/p L AKA.  ?Skin: Warm and dry. R groin wound with some surrounding erythema, although improved and some purulence, but no active drainage.  ?Neuro: A&Ox3. Moves all extremities. Normal sensation. No focal deficit. ?Psych: Normal mood and affect ? ? ? ? ?ASSESSMENT/PLAN:  ?Assessment: ?Principal Problem: ?  Wound dehiscence ?Active Problems: ?   Wound infection after surgery ?  Acute heart failure with preserved ejection fraction (HFpEF) (HCC) ? ? ?Plan: ?#Cellulitis of right inguinal wound ?Wound is still erythematous with superficial exudate. Blood cultures with no growth at 3 days. Vascular surgery has been consulted and is tentatively planning a washout and debridement of the R groin incision on Thursday.  ?- Continue Vanc and Rocephin ?- Scheduled percocet 5-325 mg q4h in attempt to reduce IV pain med need ?- Dilaudid 0.5 mg q4h prn for severe/breakthrough pain ?- Wound care ?- Continue wet to dry packing ? ?#AKI ?Cr 2.01 today, up from 1.26 on the day of admission. BUN 41. Patient states he has been eating/drinking and urinating just fine. Will obtain some labs to further evaluate etiology of AKI although will preemptively treat with IVF and assess response.  ?- Urinalysis ?- Urine Na, K, and Cr pending ?- Daily BMP ?- LR 100 cc/hr x 12 hrs ? ?#Acute hypoxic respiratory failure ?#COPD ?Patient denies any shortness of breath. COPD is suspected in the setting of his tobacco use history, although he will likely need outpatient PFTs ? ?#CAD ?#PAD ?#HLD ?Aspirin 81 mg and plavix 75 mg fdaily. Lipitor 40 mg.  ?- Hold plavix ?- Continue aspirin and lipitor ?  ?#Hypertension ?On amlodipine 10 mg, coreg 25 mg bid, irbesartan 75 mg, hydralazine 75 mg tid at home. BP remains well controlled.  ?- Continue home meds ? ?  Best Practice: ?Diet: Diabetic diet ?IVF: Fluids: LR, Rate:  100 cc/hr x 12 hrs ?VTE: enoxaparin (LOVENOX) injection 40 mg Start: 02/04/22 2200 ?Code: Full ?AB: Vanc, Rocephin ?Therapy Recs: Home Health, DME: none ?DISPO: Anticipated discharge in ~3 days to Home pending Medical stability. ? ?Signature: ?Daliya Parchment, D.O.  ?Internal Medicine Resident, PGY-1 ?[facility]  ?Pager: [phone] ?7:32 AM, 02/06/2022  ? ?Please contact the on call pager after 5 pm and on weekends at [phone]. ? ?

## 2022-02-06 NOTE — Plan of Care (Signed)
Received a call from RN stating that patient wants to be transferred from [facility] unit or leave AMA due to feeling disrespected by staff on the unit. Spoke to patient who informed that someone (male doctor?) said patient made inappropriate statements, so he did not want to be under his care any longer. Validated patient's concerns and advised that each hospital unit serves a different level of care so he could not be transferred. Patient voiced understanding and agreed to continue care. ? ?Lamar Sprinkles, MD ?Internal Medicine/ Psych Intern ?PGY-1 ?02/06/2022 6:14 PM ?Pager: [phone] ?After 5pm on weekdays and 1pm on weekends: On Call pager [phone]   ?

## 2022-02-06 NOTE — Progress Notes (Signed)
Pharmacy Antibiotic Note ? ?Antonio [name]. is a 66 y.o. male admitted on 02/03/2022 with R groin infection.  Pharmacy has been consulted for vancomycin dosing. Pt also on ceftriaxone. ? ?SCr continues trend upward to 2.01, UOP ~1000 past 24 hrs. VT 22 is supratherapeutic although is not at steady state given variable vanocomycin doses due to labile renal function. Planning I&D 3/16 with [facility]. ? ?3/14 Vancomycin 1250mg  Q 24 hr ?Scr used: 2.01 mg/dL ?Weight: 93 kg ?Vd coeff: 0.72 L/kg ?Est AUC: 488  ? ?But given worsening renal function and high trough, will decrease dose to 1000mg  Q24 hr and adjust as needed  ? ?Plan: ?Rocephin per MD  ?Start vancomycin 1000mg  q24 hr at noon (~39 hr from last dose)  ?Monitor cultures, clinical status, renal function, vancomycin levels ?Narrow abx as able and f/u duration  ? ?Height: 6' (182.9 cm) ?Weight: 93 kg (205 lb 0.4 oz) ?IBW/kg (Calculated) : 77.6 ? ?Temp (24hrs), Avg:97.8 ?F (36.6 ?C), Min:97.5 ?F (36.4 ?C), Max:98.2 ?F (36.8 ?C) ? ?Recent Labs  ?Lab 02/03/22 ?1947 02/04/22 ?1826 02/05/22 ?04/05/22 02/06/22 ?02/07/22 02/06/22 ?2042  ?WBC 5.1  --   --  5.4  --   ?CREATININE 1.26* 1.54* 1.82* 2.01*  --   ?LATICACIDVEN 1.8  --   --   --   --   ?VANCOTROUGH  --   --   --   --  22*  ? ?  ?Estimated Creatinine Clearance: 40.8 mL/min (A) (by C-G formula based on SCr of 2.01 mg/dL (H)).   ? ?Allergies  ?Allergen Reactions  ? Iodinated Contrast Media Hives, Rash and Other (See Comments)  ?  Blisters ?Staph ?Blisters / staph  ? Penicillins Other (See Comments)  ?  UNSPECIFIED REACTION  ?Has patient had a PCN reaction causing immediate rash, facial/tongue/throat swelling, SOB or lightheadedness with hypotension: No ?Has patient had a PCN reaction causing severe rash involving mucus membranes or skin necrosis: No ?Has patient had a PCN reaction that required hospitalization: No ?Has patient had a PCN reaction occurring within the last 10 years: No ?If all of the above answers are "NO",  then may proceed with Cephalosporin use.  ? ? ?Antimicrobials this admission: ?flagyl 3/11 1x1  ?vancomycin 3/11 >>  ?cefepime 3/11 >> 3/13 ?Ceftriaxone 3/13 >> ? ?Vancomycin trough 3/14 = 22 (not SS) ? ?Microbiology results: ?3/11 BCx: ngtd ?4/13, PharmD, BCPS, BCCP ?Clinical Pharmacist ? ?Please check AMION for all [facility] phone numbers ?After 10:00 PM, call Main Pharmacy [phone] ? ?

## 2022-02-06 NOTE — Evaluation (Signed)
Physical Therapy Evaluation ?Patient Details ?[facility]: [facility] ?MRN: [id number] ?DOB: [birth year] ?Today's Date: 02/06/2022 ? ?History of Present Illness ? 66 yo male presenting to ED with pain and redness of his R groin area. Pt had R iliofemoral endarterectomy and TMA on 2/6. PMH including HTN, diabetes, PAD s/p LSFA stenting, s/p left BKA (2019), and tobacco use disorder.  ?Clinical Impression ? Pt presents with decreased functional mobility, transfer ability, and standing balance secondary to diagnosis above. These impairments are limiting his ability to safely and independently transfer, get into his home, perform all adls/iadls, and get around in the community. Pt to benefit from acute PT to address deficits. STS transfer performed which required significant encouragement. Pt. Was min guard for transfer for set up, support, and safety.  Pt. Responded well but was limited secondary to functional strength, balance, and mobility. He also presents with movement anxiety that is shown by significant "stalling" throughout the session. Pt likely to require increased support, at least supervision level, for return home due to functional limitations. SPT recommends home health therapy consult for further functional improvement. PT to sign off as pt reports he is at baseline level of functioning and refuses further PT. Please re-consult if medical status changes.  ?   ? ?Recommendations for follow up therapy are one component of a multi-disciplinary discharge planning process, led by the attending physician.  Recommendations may be updated based on patient status, additional functional criteria and insurance authorization. ? ?Follow Up Recommendations Home health PT (Likely will refuse) ? ?  ?Assistance Recommended at Discharge Intermittent Supervision/Assistance  ?Patient can return home with the following ? A lot of help with walking and/or transfers;A little help with bathing/dressing/bathroom;Assistance with  cooking/housework;Assist for transportation ? ?  ?Equipment Recommendations None recommended by PT  ?Recommendations for Other Services ?    ?  ?Functional Status Assessment Patient has had a recent decline in their functional status and demonstrates the ability to make significant improvements in function in a reasonable and predictable amount of time.  ? ?  ?Precautions / Restrictions Precautions ?Precautions: Fall ?Precaution Comments: R TMA; prothesis for L prior BKA in room ?Restrictions ?RLE Weight Bearing: Partial weight bearing ?RLE Partial Weight Bearing Percentage or Pounds: heel ?Other Position/Activity Restrictions: no orders placed, from prior vascular sx note & pt report  ? ?  ? ?Mobility ? Bed Mobility ?Overal bed mobility: Modified Independent ?  ?  ?  ?  ?  ?  ?General bed mobility comments: EOB upon PT arrival ?  ? ?Transfers ?Overall transfer level: Needs assistance ?Equipment used: Rolling walker (2 wheels) (R prosthesis, RW with standard chair inside it to allow for hand holds at patient request) ?Transfers: Bed to chair/wheelchair/BSC ?  ?Stand pivot transfers: Min guard ?  ?  ?  ?  ?General transfer comment: Min guard for setup, blocking of chair and walker for stability, and for saftey ?  ? ?Ambulation/Gait ?  ?  ?  ?  ?  ?  ?  ?  ? ?Stairs ?  ?  ?  ?  ?  ? ?Wheelchair Mobility ?  ? ?Modified Rankin (Stroke Patients Only) ?  ? ?  ? ?Balance Overall balance assessment: Needs assistance ?Sitting-balance support: No upper extremity supported, Feet unsupported ?Sitting balance-Leahy Scale: Good ?Sitting balance - Comments: Sitting EOB, able to reach minimally out of BOS and return to upright posture ?  ?Standing balance support: Bilateral upper extremity supported ?Standing balance-Leahy Scale: Poor ?Standing balance  comment: Requires B support from RW or bed rails for power up and steady during stand pivot ?  ?  ?  ?  ?  ?  ?  ?  ?  ?  ?  ?   ? ? ? ?Pertinent Vitals/Pain Pain Assessment ?Pain  Assessment: Faces ?Faces Pain Scale: Hurts a little bit ?Pain Location: Groin, R Heel ?Pain Descriptors / Indicators: Discomfort, Grimacing, Guarding ?Pain Intervention(s): Limited activity within patient's tolerance, Monitored during session  ? ? ?Home Living Family/patient expects to be discharged to:: Private residence ?Living Arrangements: Parent;Children ?Available Help at Discharge: Family;Available PRN/intermittently ?Type of Home: House ?Home Access: Level entry ?  ?  ?Alternate Level Stairs-Number of Steps: Lives in basement ?Home Layout: Multi-level;Able to live on main level with bedroom/bathroom ?Home Equipment: Shower seat;Electric scooter;Hand held [employer] (4 wheels);Wheelchair - power ?Additional Comments: Transfers to/from wheelchair for all mobility, reports mod I for all adls  ?  ?Prior Function Prior Level of Function : Independent/Modified Independent ?  ?  ?  ?  ?  ?  ?Mobility Comments: mobilizes at wc level, dons prosthetic "almost" every morning. stand-pivots ?ADLs Comments: reports mod I, daughters assists with IADLs as needed ?  ? ? ?Hand Dominance  ? Dominant Hand: Right ? ?  ?Extremity/Trunk Assessment  ? Upper Extremity Assessment ?Upper Extremity Assessment: Defer to OT evaluation ?  ? ?Lower Extremity Assessment ?Lower Extremity Assessment: RLE deficits/detail;LLE deficits/detail;Generalized weakness ?RLE Deficits / Details: WFL ?LLE Deficits / Details: Recent transmet amputation ?LLE: Unable to fully assess due to pain ?  ? ?Cervical / Trunk Assessment ?Cervical / Trunk Assessment: Kyphotic  ?Communication  ? Communication: No difficulties  ?Cognition Arousal/Alertness: Awake/alert ?Behavior During Therapy: [facility] for tasks assessed/performed ?Overall Cognitive Status: No family/caregiver present to determine baseline cognitive functioning ?  ?  ?  ?  ?  ?  ?  ?  ?  ?  ?  ?  ?  ?  ?  ?  ?General Comments: Limited insight to deficits, anxiety during movement and trasnfers  that presents as stalling. ?  ?  ? ?  ?General Comments General comments (skin integrity, edema, etc.): Pt. performs transfer with significant straining to stand and during turn. Uncotrolled return to sit and "crashed" down to bed while pivoting on R LE ? ?  ?Exercises    ? ?Assessment/Plan  ?  ?PT Assessment Patient does not need any further PT services  ?PT Problem List Decreased strength;Decreased mobility;Decreased safety awareness;Decreased range of motion;Decreased coordination;Decreased knowledge of precautions;Decreased activity tolerance;Decreased cognition;Decreased balance;Decreased knowledge of use of DME;Pain ? ?   ?  ?PT Treatment Interventions     ? ?PT Goals (Current goals can be found in the Care Plan section)  ?Acute Rehab PT Goals ?Patient Stated Goal: To return home ?PT Goal Formulation: With patient ?Time For Goal Achievement: 02/06/22 ?Potential to Achieve Goals: Fair ? ?  ?Frequency   ?  ? ? ?Co-evaluation   ?  ?  ?  ?  ? ? ?  ?AM-PAC PT "6 Clicks" Mobility  ?Outcome Measure Help needed turning from your back to your side while in a flat bed without using bedrails?: None ?Help needed moving from lying on your back to sitting on the side of a flat bed without using bedrails?: None ?Help needed moving to and from a bed to a chair (including a wheelchair)?: A Little ?Help needed standing up from a chair using your arms (e.g., wheelchair or bedside chair)?: A  Little ?Help needed to walk in hospital room?: Total ?Help needed climbing 3-5 steps with a railing? : Total ?6 Click Score: 16 ? ?  ?End of Session   ?Activity Tolerance: Patient tolerated treatment well ?Patient left: with call bell/phone within reach;with bed alarm set;in bed ?Nurse Communication: Mobility status ?PT Visit Diagnosis: Unsteadiness on feet (R26.81);Other abnormalities of gait and mobility (R26.89);Muscle weakness (generalized) (M62.81) ?  ? ?Time: [id number] ?PT Time Calculation (min) (ACUTE ONLY): 27 min ? ? ?Charges:   PT  Evaluation ?$PT Eval Low Complexity: 1 Low ?PT Treatments ?$Therapeutic Activity: 8-22 mins ?  ?   ? ? ?Thermon Leyland, SPT ?Acute Rehab Services ? ? ?Thermon Leyland ?02/06/2022, 10:51 AM ? ?

## 2022-02-06 NOTE — Progress Notes (Signed)
?  Progress Note ? ? ? ?02/06/2022 ?7:56 AM ? ?Subjective:  No complaints ? ? ?Vitals:  ? 02/06/22 0248 02/06/22 0751  ?BP: (!) 113/98 (!) 142/54  ?Pulse: 65 63  ?Resp: 20 18  ?Temp: 98.2 ?F (36.8 ?C) 97.6 ?F (36.4 ?C)  ?SpO2: 90% 90%  ? ?Physical Exam: ?Lungs:  non labored ?Incisions:  R groin with erythema extending along groin fold ?Extremities:  R TMA healing well ?Neurologic: A&O ? ?CBC ?   ?Component Value Date/Time  ? WBC 5.4 02/06/2022 0342  ? RBC 3.81 (L) 02/06/2022 0342  ? HGB 10.5 (L) 02/06/2022 0342  ? HCT 32.0 (L) 02/06/2022 0342  ? PLT 185 02/06/2022 0342  ? MCV 84.0 02/06/2022 0342  ? MCH 27.6 02/06/2022 0342  ? MCHC 32.8 02/06/2022 0342  ? RDW 14.0 02/06/2022 0342  ? LYMPHSABS 1.4 02/03/2022 1947  ? MONOABS 0.4 02/03/2022 1947  ? EOSABS 0.2 02/03/2022 1947  ? BASOSABS 0.0 02/03/2022 1947  ? ? ?BMET ?   ?Component Value Date/Time  ? NA 131 (L) 02/06/2022 0342  ? K 4.3 02/06/2022 0342  ? CL 99 02/06/2022 0342  ? CO2 22 02/06/2022 0342  ? GLUCOSE 165 (H) 02/06/2022 0342  ? BUN 41 (H) 02/06/2022 0342  ? CREATININE 2.01 (H) 02/06/2022 0342  ? CALCIUM 8.6 (L) 02/06/2022 0342  ? GFRNONAA 36 (L) 02/06/2022 0342  ? GFRAA >60 02/15/2019 0455  ? ? ?INR ?   ?Component Value Date/Time  ? INR 1.0 02/03/2022 1947  ? ? ? ?Intake/Output Summary (Last 24 hours) at 02/06/2022 0756 ?Last data filed at 02/06/2022 0310 ?Gross per 24 hour  ?Intake 710.07 ml  ?Output 900 ml  ?Net -189.93 ml  ? ? ? ?Assessment/Plan:  66 y.o. male is s/p R iliofemoral endarterectomy with bovine patch angioplasty and R TMA  ? ?TMA healing well currently; continue heel weightbearing ?R groin incision with erythema extending along groin fold; continue broad spectrum antibiotics ?Dr. Karin Lieu tentatively planning washout and debridement of R groin incision in OR Thursday.  We can also take TMA staples out at that time ?Continue wet to dry packing of R groin at least daily for now ? ? ?Emilie Rutter, PA-C ?[facility] ?[phone] ?02/06/2022 ?7:56 AM ? ? ? ?

## 2022-02-06 NOTE — TOC Initial Note (Addendum)
Transition of Care (TOC) - Initial/Assessment Note  ? ? ?Patient Details  ?Name: Antonio [name]. ?MRN: [id number] ?Date of Birth: 11/27/1955 ? ?Transition of Care (TOC) CM/SW Contact:    ?Marilu Favre, RN ?Phone Number: ?02/06/2022, 1:37 PM ? ?Clinical Narrative:                 ? ?Discussed home health with patient at bedside. Discussed it is hard to get home health services for medicaid patients. NCM will call every agency listed for patient's zip code. NCM confirmed face sheet information. Patient lives in the basement . Explained patient will need to have support person at home be taugt dressing changes prior to discharge . Even if patient does have HHRN, HHRN does not visit daily. PAtient voiced understanding and stated his daughter will assist but she will need to be taught between 1200 and 1300pm. NCM explained bedside nurse will provide teaching, await final wound care orders. Patient tentatively to have more surgery Thursday. Patient voiced understanding. ? ?Home Health agencies contacted :  ? ?[facility] does not accept patient's insurance.  ? ?[facility] left Tommi Rumps a message.  ? ?[facility] left Amy a message  ? ?[facility] left Malachy Mood a message . Malachy Mood returned call they are not in network with patient's insurance.  ? ?[facility] left Levada Dy a message . Levada Dy returned call , they do not cover patient's address.  ? ?Hoyle Sauer with [facility] does not cover patient's address.  ? ?[facility] left Anderson Malta a message ? ?[facility] left Stacie a message. Stacie returned call they do not take patient's insurance.  ? ?Sunday Spillers with [facility] cannot accept referral.   ? ? Left Corene Cornea with [facility] a message . Corene Cornea returned call he is unable to accept due to address.  ? ?[facility] does not cover patient's address.  ? ?[facility] [address] [phone] unable to accept due to staffing. ?  ?Barriers to Discharge: Continued Medical Work up ? ? ?Patient Goals and CMS Choice ?Patient states their  goals for this hospitalization and ongoing recovery are:: to return to home ?CMS Medicare.gov Compare Post Acute Care list provided to:: Patient ?Choice offered to / list presented to : Patient ? ?Expected Discharge Plan and Services ?  ?  ?Discharge Planning Services: CM Consult ?Post Acute Care Choice: Home Health ?Living arrangements for the past 2 months: [place] ?                ?DME Arranged: N/A ?DME Agency: NA ?  ?  ?  ?HH Arranged: PT, RN ?  ?  ?  ?  ? ?Prior Living Arrangements/Services ?Living arrangements for the past 2 months: [place] ?Lives with:: Adult Children ?Patient language and need for interpreter reviewed:: Yes ?       ?Need for Family Participation in Patient Care: Yes (Comment) ?Care giver support system in place?: Yes (comment) ?  ?Criminal Activity/Legal Involvement Pertinent to Current Situation/Hospitalization: No - Comment as needed ? ?Activities of Daily Living ?Home Assistive Devices/Equipment: CBG Meter, Wheelchair ?ADL Screening (condition at time of admission) ?Patient's cognitive ability adequate to safely complete daily activities?: No ?Is the patient deaf or have difficulty hearing?: No ?Does the patient have difficulty seeing, even when wearing glasses/contacts?: No ?Does the patient have difficulty concentrating, remembering, or making decisions?: No ?Patient able to express need for assistance with ADLs?: Yes ?Does the patient have difficulty dressing or bathing?: No ?Independently performs ADLs?: Yes (appropriate for developmental age) ?  Does the patient have difficulty walking or climbing stairs?: Yes ?Weakness of Legs: Right ?Weakness of Arms/Hands: None ? ?Permission Sought/Granted ?  ?Permission granted to share information with : No ?   ?   ?   ?   ? ?Emotional Assessment ?Appearance:: Appears stated age ?Attitude/Demeanor/Rapport: Engaged ?Affect (typically observed): Accepting ?Orientation: : Oriented to Self, Oriented to Place, Oriented to  Time,  Oriented to Situation ?Alcohol / Substance Use: Not Applicable ?Psych Involvement: No (comment) ? ?Admission diagnosis:  Wound dehiscence [T81.30XA] ?Wound infection after surgery [T81.49XA] ?Patient Active Problem List  ? Diagnosis Date Noted  ? Acute heart failure with preserved ejection fraction (HFpEF) ([place])   ? Wound dehiscence 02/03/2022  ? AKI (acute kidney injury) ([place])   ? Benign essential HTN   ? Uncontrolled type 2 diabetes mellitus with hyperglycemia ([place])   ? S/P transmetatarsal amputation of foot, right ([place]) 01/09/2022  ? Preoperative cardiovascular examination   ? PVD (peripheral vascular disease) ([place])   ? Osteomyelitis ([place]) 12/28/2021  ? Airway trauma   ? Acute respiratory failure ([place])   ? Dysphagia   ? Status post tracheostomy ([place])   ? HCAP (healthcare-associated pneumonia)   ? Diabetes mellitus type 2 in nonobese [facility])   ? Hypertension   ? S/P BKA (below knee amputation) unilateral, left ([place])   ? Tachypnea   ? Pain   ? Agitation   ? Acute blood loss anemia   ? Phlegm in throat   ? Fever, unspecified   ? Tachycardia   ? Acute on chronic respiratory failure ([place])   ? Hypoxemia   ? Acute pulmonary edema ([facility])   ? Hypoxia   ? Tracheostomy status ([place])   ? Pneumonia   ? Influenza A virus present   ? Pressure injury of skin 12/01/2018  ? SIRS (systemic inflammatory response syndrome) ([place]) 11/28/2018  ? Acute respiratory failure with hypoxemia ([place]) 11/28/2018  ? Influenza A   ? Hyponatremia   ? Chest pain 05/02/2018  ? Hyperlipidemia 05/02/2018  ? Tobacco abuse 05/02/2018  ? Coronary artery disease involving native coronary artery of native heart with angina pectoris ([place]) 05/02/2018  ? S/P transmetatarsal amputation of foot, left ([place]) 04/15/2017  ? Amputated great toe of right foot ([place]) 04/15/2017  ? Ulcer of toe of left foot, limited to breakdown of skin ([place]) 04/04/2017  ? Partial nontraumatic amputation of foot, right ([place]) 04/04/2017  ? Type 2 diabetes mellitus with hyperglycemia, with  long-term current use of insulin ([place])   ? Edema   ? Wound infection after surgery   ? ?PCP:  Imagene Riches, NP ?Pharmacy:   ?[facility] [address] ?[place] [address] ?Phone: [phone] Fax: [phone] ? ?[facility] ?[address] ?[place] [address] ?Phone: [phone] Fax: [phone] ? ? ? ? ?Social Determinants of Health (SDOH) Interventions ?  ? ?Readmission Risk Interventions ?Readmission Risk Prevention Plan 01/09/2022  ?Transportation Screening Complete  ?PCP or Specialist Appt within 5-7 Days (No Data)  ?Home Care Screening Complete  ?Medication Review (RN CM) Complete  ?Some recent data might be hidden  ? ? ? ?

## 2022-02-06 NOTE — Progress Notes (Signed)
Pharmacy Antibiotic Note ? ?Antonio [name]. is a 66 y.o. male admitted on 02/03/2022 with R groin infection.  Pharmacy has been consulted for vancomycin dosing. Pt also on ceftriaxone. ? ?SCr continues trend upward to 2.01, UOP ~1000 past 24 hrs. ? ?Plan: ?Rocephin per MD ?Will d/c scheduled Vanc for now until trough obtained ?SCr continues upward climb and pt with large doses of Vanc for first 2 doses so will get vancomycin trough to make sure not supratherapeutic ?F/u micro data and pt's clinical condition and de-escalation of abx ? ?Height: 6' (182.9 cm) ?Weight: 93 kg (205 lb 0.4 oz) ?IBW/kg (Calculated) : 77.6 ? ?Temp (24hrs), Avg:98.1 ?F (36.7 ?C), Min:97.6 ?F (36.4 ?C), Max:98.9 ?F (37.2 ?C) ? ?Recent Labs  ?Lab 02/03/22 ?1947 02/04/22 ?1826 02/05/22 ?1448 02/06/22 ?0342  ?WBC 5.1  --   --  5.4  ?CREATININE 1.26* 1.54* 1.82* 2.01*  ?LATICACIDVEN 1.8  --   --   --   ? ?  ?Estimated Creatinine Clearance: 40.8 mL/min (A) (by C-G formula based on SCr of 2.01 mg/dL (H)).   ? ?Allergies  ?Allergen Reactions  ? Iodinated Contrast Media Hives, Rash and Other (See Comments)  ?  Blisters ?Staph ?Blisters / staph  ? Penicillins Other (See Comments)  ?  UNSPECIFIED REACTION  ?Has patient had a PCN reaction causing immediate rash, facial/tongue/throat swelling, SOB or lightheadedness with hypotension: No ?Has patient had a PCN reaction causing severe rash involving mucus membranes or skin necrosis: No ?Has patient had a PCN reaction that required hospitalization: No ?Has patient had a PCN reaction occurring within the last 10 years: No ?If all of the above answers are "NO", then may proceed with Cephalosporin use.  ? ? ?Antimicrobials this admission: ?flagyl 3/11 1x1  ?vancomycin 3/11 >>  ?cefepime 3/11 >> 3/13 ?Ceftriaxone 3/13 >> ? ?Microbiology results: ?3/11 BCx: ngtd ? ?Christoper Fabian, PharmD, BCPS ?Please see amion for complete clinical pharmacist phone list ?02/06/2022 ? ? ? ?

## 2022-02-07 ENCOUNTER — Encounter (HOSPITAL_COMMUNITY): Payer: Self-pay | Admitting: Anesthesiology

## 2022-02-07 LAB — GLUCOSE, CAPILLARY
Glucose-Capillary: 151 mg/dL — ABNORMAL HIGH (ref 70–99)
Glucose-Capillary: 155 mg/dL — ABNORMAL HIGH (ref 70–99)
Glucose-Capillary: 162 mg/dL — ABNORMAL HIGH (ref 70–99)
Glucose-Capillary: 183 mg/dL — ABNORMAL HIGH (ref 70–99)
Glucose-Capillary: 167 mg/dL — ABNORMAL HIGH (ref 70–99)

## 2022-02-07 LAB — CBC
HCT: 30 % — ABNORMAL LOW (ref 39.0–52.0)
Hemoglobin: 9.9 g/dL — ABNORMAL LOW (ref 13.0–17.0)
MCH: 27.5 pg (ref 26.0–34.0)
MCHC: 33 g/dL (ref 30.0–36.0)
MCV: 83.3 fL (ref 80.0–100.0)
Platelets: 189 10*3/uL (ref 150–400)
RBC: 3.6 MIL/uL — ABNORMAL LOW (ref 4.22–5.81)
RDW: 13.9 % (ref 11.5–15.5)
WBC: 4.6 10*3/uL (ref 4.0–10.5)
nRBC: 0 % (ref 0.0–0.2)

## 2022-02-07 LAB — BASIC METABOLIC PANEL
Anion gap: 10 (ref 5–15)
BUN: 40 mg/dL — ABNORMAL HIGH (ref 8–23)
CO2: 24 mmol/L (ref 22–32)
Calcium: 8.6 mg/dL — ABNORMAL LOW (ref 8.9–10.3)
Chloride: 100 mmol/L (ref 98–111)
Creatinine, Ser: 1.73 mg/dL — ABNORMAL HIGH (ref 0.61–1.24)
GFR, Estimated: 44 mL/min — ABNORMAL LOW (ref >60)
Glucose, Bld: 177 mg/dL — ABNORMAL HIGH (ref 70–99)
Potassium: 4.3 mmol/L (ref 3.5–5.1)
Sodium: 134 mmol/L — ABNORMAL LOW (ref 135–145)

## 2022-02-07 MED ORDER — HYDROMORPHONE HCL 1 MG/ML IJ SOLN
0.5000 mg | Freq: Four times a day (QID) | INTRAMUSCULAR | Status: DC | PRN
  Administered 2022-02-07 – 2022-02-09 (×4): 0.5 mg via INTRAVENOUS
  Filled 2022-02-07 (×4): qty 0.5

## 2022-02-07 MED ORDER — LACTATED RINGERS IV SOLN: INTRAVENOUS | Status: AC

## 2022-02-07 NOTE — Progress Notes (Signed)
Occupational Therapy Treatment ?Patient Details ?Name: Antonio [name] [facility]. ?MRN: [id number] ?DOB: September 25, 1956 ?Today's Date: 02/07/2022 ? ? ?History of present illness 66 yo male presenting to ED with pain and redness of his R groin area. Pt had R iliofemoral endarterectomy and TMA on 2/6. PMH including HTN, diabetes, PAD s/p LSFA stenting, s/p left BKA (2019), and tobacco use disorder. ?  ?OT comments ? Pt able to simulate LB bathing and dressing with supervision/set up while seated EOB and leaning side to side to simulate pulling up pants.  He would not attempt functional transfers despite encouragement.  He reports his w/c will fit through doors of his apartment and he will safely be able to manage all transfers at w/c level.  Will continue to follow.   ? ?Recommendations for follow up therapy are one component of a multi-disciplinary discharge planning process, led by the attending physician.  Recommendations may be updated based on patient status, additional functional criteria and insurance authorization. ?   ?Follow Up Recommendations ? Home health OT  ?  ?Assistance Recommended at Discharge Intermittent Supervision/Assistance  ?Patient can return home with the following ? A little help with walking and/or transfers;A little help with bathing/dressing/bathroom;Assistance with cooking/housework;Direct supervision/assist for medications management;Help with stairs or ramp for entrance;Assist for transportation ?  ?Equipment Recommendations ? None recommended by OT  ?  ?Recommendations for Other Services   ? ?  ?Precautions / Restrictions Precautions ?Precautions: Fall ?Precaution Comments: R TMA; prothesis for L prior BKA in room ?Restrictions ?Weight Bearing Restrictions: Yes ?RLE Weight Bearing: Partial weight bearing ?RLE Partial Weight Bearing Percentage or Pounds: heel ?Other Position/Activity Restrictions: per vascular surgery notes  ? ? ?  ? ?Mobility Bed Mobility ?  ?  ?  ?  ?  ?  ?  ?General bed  mobility comments: pt sitting EOB ?  ? ?Transfers ?  ?  ?  ?  ?  ?  ?  ?  ?  ?General transfer comment: pt refused ?  ?  ?Balance Overall balance assessment: Needs assistance ?Sitting-balance support: No upper extremity supported, Feet unsupported ?Sitting balance-Leahy Scale: Good ?  ?  ?  ?  ?  ?  ?  ?  ?  ?  ?  ?  ?  ?  ?  ?  ?   ? ?ADL either performed or assessed with clinical judgement  ? ?ADL Overall ADL's : Needs assistance/impaired ?  ?  ?  ?  ?  ?  ?  ?  ?  ?  ?Lower Body Dressing: Supervision/safety;Sitting/lateral leans ?Lower Body Dressing Details (indicate cue type and reason): Pt able to access foot, and able to simulate pulling pants over hips by leaning side to side.  Pt admantly refused attempts to stand ?  ?Toilet Transfer Details (indicate cue type and reason): pt adamantly refused ?  ?  ?  ?  ?  ?  ?  ? ?Extremity/Trunk Assessment Upper Extremity Assessment ?Upper Extremity Assessment: Generalized weakness ?  ?Lower Extremity Assessment ?Lower Extremity Assessment: Defer to PT evaluation ?  ?  ?  ? ?Vision   ?  ?  ?Perception   ?  ?Praxis   ?  ? ?Cognition Arousal/Alertness: Awake/alert ?Behavior During Therapy: [facility] for tasks assessed/performed ?Overall Cognitive Status: No family/caregiver present to determine baseline cognitive functioning ?  ?  ?  ?  ?  ?  ?  ?  ?  ?  ?  ?  ?  ?  ?  ?  ?  General Comments: Pt verbose and tangential -difficult to direct to task.  Pt demonstrates poor safety awareness ?  ?  ?   ?Exercises   ? ?  ?Shoulder Instructions   ? ? ?  ?General Comments Pt reports he feels confident with his ability to perform ADLs and would not attempt anything further than simulated LB dressing seated.  Attempted to have pt perform transfers, but he refused.  He states he plans to use w/c at home and can transfer easily from w/c to toilet and shower  ? ? ?Pertinent Vitals/ Pain       Pain Assessment ?Pain Assessment: Faces ?Faces Pain Scale: Hurts little more ?Pain Location: Groin, R  Heel ?Pain Descriptors / Indicators: Discomfort, Grimacing, Guarding ?Pain Intervention(s): Monitored during session, Patient requesting pain meds-RN notified ? ?Home Living   ?  ?  ?  ?  ?  ?  ?  ?  ?  ?  ?  ?  ?  ?  ?  ?  ?  ?  ? ?  ?Prior Functioning/Environment    ?  ?  ?  ?   ? ?Frequency ? Min 2X/week  ? ? ? ? ?  ?Progress Toward Goals ? ?OT Goals(current goals can now be found in the care plan section) ? Progress towards OT goals: Progressing toward goals ? ?   ?Plan Discharge plan remains appropriate   ? ?Co-evaluation ? ? ?   ?  ?  ?  ?  ? ?  ?AM-PAC OT "6 Clicks" Daily Activity     ?Outcome Measure ? ? Help from another person eating meals?: None ?Help from another person taking care of personal grooming?: A Little ?Help from another person toileting, which includes using toliet, bedpan, or urinal?: A Little ?Help from another person bathing (including washing, rinsing, drying)?: A Little ?Help from another person to put on and taking off regular upper body clothing?: None ?Help from another person to put on and taking off regular lower body clothing?: A Little ?6 Click Score: 20 ? ?  ?End of Session   ? ?OT Visit Diagnosis: Unsteadiness on feet (R26.81);Other abnormalities of gait and mobility (R26.89);Muscle weakness (generalized) (M62.81);Pain ?  ?Activity Tolerance Patient limited by pain;Other (comment) (pt self limiting) ?  ?Patient Left in bed;with call bell/phone within reach ?  ?Nurse Communication Mobility status;Patient requests pain meds ?  ? ?   ? ?Time: [id number] ?OT Time Calculation (min): 13 min ? ?Charges: OT General Charges ?$OT Visit: 1 Visit ?OT Treatments ?$Self Care/Home Management : 8-22 mins ? ?Dorothey Oetken C., OTR/L ?Acute Rehabilitation Services ?Pager [phone] ?Office [phone] ? ? ?Lucille Passy M ?02/07/2022, 12:16 PM ?

## 2022-02-07 NOTE — TOC Progression Note (Addendum)
Transition of Care (TOC) - Progression Note  ? ? ?Patient Details  ?Name: Gaege Sangalang [facility]. ?MRN: [id number] ?Date of Birth: 07-16-1956 ? ?Transition of Care (TOC) CM/SW Contact  ?Kingsley Plan, RN ?Phone Number: ?02/07/2022, 11:01 AM ? ?Clinical Narrative:    ? ?Home Health agencies contacted :  ?  ?[facility] does not accept patient's insurance.  ?  ?[facility] left Kandee Keen a message. [facility] cannot accept due to address.  ?  ?[facility] left Amy a message . Amy returned call , she cannot accept due to insurance  ? ?Carolyn with [facility] does not cover patient's address.  ?  ?[facility] left Elnita Maxwell a message . Elnita Maxwell  unable to accept  ? ?[facility] does not cover patient's address.  ?  ?[facility] [phone] unable to accept due to out of network with patient's insurance ( spoke to [place])  ?  ?[facility]  not in network with insurance  ? ?  ?Spoke to [place] at [facility] . [facility] cannot accept a medicaid right now.  ?  ?Spoke to [place] at [facility] they do not take patient's insurance.  ?  ?Nettie Elm with [facility] cannot accept referral.   ?  ? Left Barbara Cower with [facility] a message . Barbara Cower returned call he is unable to accept due to address.  ?  ? ?Patient aware he will not have home health services, he stated "there's nothing I can't do for myself". Discussed OP PT, patient does not feel he needs that at this time.  ? ?Discussed yesterday daughter will be taught wound care prior to discharge.  ? ?NCM secure chatted attending, Dr Ned Card, Dr Gerarda Fraction and Emilie Rutter PA ? ?Vascular aware and want to place a VAC tomorrow. NCM notified TOC supervisor   ? ?Dr Karin Lieu plans to place a [facility] tomorrow .  ? ?NCM called home health agencies back to update on VAC  ? ?[facility] does not accept patient's insurance.  ?  ?Spoke to [place] with [facility] cannot accept due to address.  ?  ?[facility] left Amy a message . Amy returned call , she will ask her office again  ?  ?Spoke to [place] with  [facility]  they are not in network with patient's insurance.  ?  ?Spoke to [place] with [facility] , they do not service patient's address.  ?  ?Eber Jones with [facility] does not cover patient's address.  ?  ?[facility] left [facility] a message. [facility] still cannot accept a medicaid right now.  ?  ?Spoke to [place] with [facility]  they do not take patient's insurance.  ?  ?Nettie Elm with [facility] cannot accept referral.   ?  ?Spoke to  [place] with [facility]  he is unable to accept due to address.  ?  ?[facility] does not cover patient's address.  ?  ?[facility] [phone] unable to accept due to out of network with patient's insurance ( spoke to [place])  ? ?  ?Barriers to Discharge: Continued Medical Work up ? ?Expected Discharge Plan and Services ?  ?  ?Discharge Planning Services: CM Consult ?Post Acute Care Choice: Home Health ?Living arrangements for the past 2 months: Single Family Home ?                ?DME Arranged: N/A ?DME Agency: NA ?  ?  ?  ?HH Arranged: PT, RN ?  ?  ?  ?  ? ? ?Social Determinants of  Health (SDOH) Interventions ?  ? ?Readmission Risk Interventions ?Readmission Risk Prevention Plan 01/09/2022  ?Transportation Screening Complete  ?PCP or Specialist Appt within 5-7 Days (No Data)  ?Home Care Screening Complete  ?Medication Review (RN CM) Complete  ?Some recent data might be hidden  ? ? ?

## 2022-02-07 NOTE — Progress Notes (Addendum)
?  Progress Note ? ? ? ?02/07/2022 ?8:35 AM ? ?Subjective:  no new complaints ? ? ?Vitals:  ? 02/07/22 0406 02/07/22 0807  ?BP: 136/65 (!) 125/57  ?Pulse: 65 63  ?Resp: 18 18  ?Temp: 98.3 ?F (36.8 ?C)   ?SpO2: 91% 95%  ? ?Physical Exam: ?Lungs:  non labored ?Incisions:  R groin incision dehiscence with nonviable tissue in wound bed and surrounding erythema ?Extremities:  R TMA healing well ?Neurologic: A&O ? ?CBC ?   ?Component Value Date/Time  ? WBC 4.6 02/07/2022 0120  ? RBC 3.60 (L) 02/07/2022 0120  ? HGB 9.9 (L) 02/07/2022 0120  ? HCT 30.0 (L) 02/07/2022 0120  ? PLT 189 02/07/2022 0120  ? MCV 83.3 02/07/2022 0120  ? MCH 27.5 02/07/2022 0120  ? MCHC 33.0 02/07/2022 0120  ? RDW 13.9 02/07/2022 0120  ? LYMPHSABS 1.4 02/03/2022 1947  ? MONOABS 0.4 02/03/2022 1947  ? EOSABS 0.2 02/03/2022 1947  ? BASOSABS 0.0 02/03/2022 1947  ? ? ?BMET ?   ?Component Value Date/Time  ? NA 134 (L) 02/07/2022 0120  ? K 4.3 02/07/2022 0120  ? CL 100 02/07/2022 0120  ? CO2 24 02/07/2022 0120  ? GLUCOSE 177 (H) 02/07/2022 0120  ? BUN 40 (H) 02/07/2022 0120  ? CREATININE 1.73 (H) 02/07/2022 0120  ? CALCIUM 8.6 (L) 02/07/2022 0120  ? GFRNONAA 44 (L) 02/07/2022 0120  ? GFRAA >60 02/15/2019 0455  ? ? ?INR ?   ?Component Value Date/Time  ? INR 1.0 02/03/2022 1947  ? ? ? ?Intake/Output Summary (Last 24 hours) at 02/07/2022 0835 ?Last data filed at 02/07/2022 0602 ?Gross per 24 hour  ?Intake 993.55 ml  ?Output 350 ml  ?Net 643.55 ml  ? ? ? ?Assessment/Plan:  66 y.o. male is s/p  R iliofemoral endarterectomy with bovine patch angioplasty and R TMA  ? ?R TMA healing well; heel weightbearing only ?R groin erythema improving; non viable sloughing tissue in wound bed; continue wet to dry packing daily ?Continue broad spectrum antibiotics; would also consider agent to cover fungal given appearance of R groin ?Plan is for R groin washout in OR tomorrow with Dr. Karin Lieu; we can also remove staples at that time.  Patient is agreeable to proceed ? ? ?Emilie Rutter, PA-C ?[facility] ?[phone] ?02/07/2022 ?8:35 AM ? ?VASCULAR STAFF ADDENDUM: ?I have independently interviewed and examined the patient. ?I agree with the above.  ? ? ?J. Gillis Santa, MD ?[facility] ?Office Phone Number: [phone] ?02/07/2022 9:30 AM ? ? ? ?

## 2022-02-07 NOTE — Progress Notes (Signed)
? ?HD#4 ?SUBJECTIVE:  ?Patient Summary: Antonio [name]. is a 66 y.o. 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  ? ?Overnight Events: No acute events overnight ? ?Interim History: This is hospital day 4 for this patient who was seen and evaluated at the bedside this morning. He states that his pain is on and off, and it does improve with pain meds. He is eating is okay but has not been drinking a lot of fluids, as he does not like to get up to use the restroom given his pain.  ? ?OBJECTIVE:  ?Vital Signs: ?Vitals:  ? 02/06/22 0751 02/06/22 1634 02/06/22 2012 02/07/22 0406  ?BP: (!) 142/54 (!) 117/52 (!) 124/59 136/65  ?Pulse: 63 63 66 65  ?Resp: 18 18 18 18   ?Temp: 97.6 ?F (36.4 ?C) 97.7 ?F (36.5 ?C) (!) 97.5 ?F (36.4 ?C) 98.3 ?F (36.8 ?C)  ?TempSrc: Oral  Oral Oral  ?SpO2: 90% 91% 96% 91%  ?Weight:      ?Height:      ? ?Supplemental O2: Room Air ?SpO2: 91 % ?O2 Flow Rate (L/min): 4 L/min ? ?Filed Weights  ? 02/05/22 1000  ?Weight: 93 kg  ? ? ? ?Intake/Output Summary (Last 24 hours) at 02/07/2022 0648 ?Last data filed at 02/07/2022 0602 ?Gross per 24 hour  ?Intake 993.55 ml  ?Output 450 ml  ?Net 543.55 ml  ? ?Net IO Since Admission: 553.62 mL [02/07/22 0648] ? ?Physical Exam: ?General: Pleasant, well-appearing elderly male laying in bed. No acute distress. ?CV: RRR. No murmurs, rubs, or gallops.  ?Pulmonary: Lungs CTAB. Normal effort. No wheezing or rales. ?Abdominal: Soft, nontender, nondistended. Normal bowel sounds. ?Extremities: S/p R transmetatarsal amputation. S/p L AKA ?Skin: Warm and dry. R groin wound with some surrounding erythema, although improved. Some purulence noted, but covered in gauze.  ?Neuro: A&Ox3. Moves all extremities. Normal sensation. No focal deficit. ?Psych:  Normal mood and affect ? ? ? ?ASSESSMENT/PLAN:  ?Assessment: ?Principal Problem: ?  Wound dehiscence ?Active Problems: ?  Wound infection after surgery ?  Acute heart failure with preserved ejection fraction (HFpEF) (HCC) ? ? ?Plan: ?#Cellulitis of right inguinal wound ?#Wound dehiscence ?Wound is still erythematous with superficial exudate. Blood cultures with no growth. Patient remains on broad spectrum IV antibiotics and plan for vascular surgery to do a washout in the OR tomorrow with Dr. 02/09/22. WBC within normal limits.  ?- Vascular surgery consulted, appreciate recs ?- Continue vanc and rocephin ?- Tylenol 650 mg q6h prn for mild pain ?- Percocet 5-325 mg q4h ?- Dilaudid 0.5 mg q6h prn for breakthrough pain  ?- Continue wet to dry packings ? ?#AKI ?FeNa calculated at 0.5%, consistent with prerenal etiology of this patient's AKI. Cr improved from 2 to 1.73 today. Will continue IV fluids today.  ?- Daily BMP ?- LR 100 cc/hr x 12 hrs ?- Avoid nephrotoxins  ? ?#Acute hypoxic respiratory failure ?#COPD ?Patient denies any shortness of breath and continues to saturate well on room air. COPD is suspected in the setting of his tobacco use history, although he will likely need outpatient PFTs ?  ?#CAD ?#PAD ?#HLD ?Aspirin 81 mg and plavix 75 mg fdaily. Lipitor 40 mg.  ?- Hold plavi ?- Continue aspirin and lipitor ?  ?#Hypertension ?On amlodipine 10 mg, coreg 25 mg bid,  irbesartan 75 mg, hydralazine 75 mg tid at home. Blood pressure remains stable/well controlled. ?- Continue home meds ?  ? ?Best Practice: ?Diet: Diabetic diet, NPO at midnight  ?IVF: Fluids: LR 100 cc/hr x 12 hrs ?VTE: enoxaparin (LOVENOX) injection 40 mg Start: 02/04/22 2200 ?Code: Full ?AB: Vanc, Rocephin ?Therapy Recs: Home Health ?DISPO: Anticipated discharge in 2-3 days to Home pending Medical stability. ? ?Signature: ?Dorothyann Mourer, D.O.  ?Internal Medicine Resident, PGY-1 ?[facility]  ?Pager: [phone] ?6:48 AM,  02/07/2022  ? ?Please contact the on call pager after 5 pm and on weekends at [phone]. ? ?

## 2022-02-08 ENCOUNTER — Encounter (HOSPITAL_COMMUNITY)
Admission: EM | Disposition: A | Discharge status: Home Health | Payer: Self-pay | Source: Home / Self Care | Attending: Internal Medicine

## 2022-02-08 ENCOUNTER — Encounter (HOSPITAL_COMMUNITY): Payer: Self-pay | Admitting: Internal Medicine

## 2022-02-08 ENCOUNTER — Inpatient Hospital Stay (HOSPITAL_COMMUNITY): Payer: Medicaid Other | Admitting: Certified Registered Nurse Anesthetist

## 2022-02-08 ENCOUNTER — Other Ambulatory Visit: Payer: Self-pay

## 2022-02-08 DIAGNOSIS — I10 Essential (primary) hypertension: Secondary | ICD-10-CM

## 2022-02-08 DIAGNOSIS — N179 Acute kidney failure, unspecified: Secondary | ICD-10-CM

## 2022-02-08 DIAGNOSIS — S31109A Unspecified open wound of abdominal wall, unspecified quadrant without penetration into peritoneal cavity, initial encounter: Secondary | ICD-10-CM

## 2022-02-08 DIAGNOSIS — I9789 Other postprocedural complications and disorders of the circulatory system, not elsewhere classified: Secondary | ICD-10-CM

## 2022-02-08 DIAGNOSIS — I25119 Atherosclerotic heart disease of native coronary artery with unspecified angina pectoris: Secondary | ICD-10-CM

## 2022-02-08 HISTORY — PX: GROIN DEBRIDEMENT: SHX5159

## 2022-02-08 LAB — BASIC METABOLIC PANEL
Anion gap: 8 (ref 5–15)
BUN: 36 mg/dL — ABNORMAL HIGH (ref 8–23)
CO2: 24 mmol/L (ref 22–32)
Calcium: 8.7 mg/dL — ABNORMAL LOW (ref 8.9–10.3)
Chloride: 101 mmol/L (ref 98–111)
Creatinine, Ser: 1.5 mg/dL — ABNORMAL HIGH (ref 0.61–1.24)
GFR, Estimated: 52 mL/min — ABNORMAL LOW (ref >60)
Glucose, Bld: 222 mg/dL — ABNORMAL HIGH (ref 70–99)
Potassium: 4.5 mmol/L (ref 3.5–5.1)
Sodium: 133 mmol/L — ABNORMAL LOW (ref 135–145)

## 2022-02-08 LAB — CBC
HCT: 30.1 % — ABNORMAL LOW (ref 39.0–52.0)
Hemoglobin: 9.9 g/dL — ABNORMAL LOW (ref 13.0–17.0)
MCH: 27.5 pg (ref 26.0–34.0)
MCHC: 32.9 g/dL (ref 30.0–36.0)
MCV: 83.6 fL (ref 80.0–100.0)
Platelets: 200 10*3/uL (ref 150–400)
RBC: 3.6 MIL/uL — ABNORMAL LOW (ref 4.22–5.81)
RDW: 14.1 % (ref 11.5–15.5)
WBC: 4 10*3/uL (ref 4.0–10.5)
nRBC: 0 % (ref 0.0–0.2)

## 2022-02-08 LAB — SURGICAL PCR SCREEN
MRSA, PCR: NEGATIVE
Staphylococcus aureus: NEGATIVE

## 2022-02-08 LAB — GLUCOSE, CAPILLARY
Glucose-Capillary: 160 mg/dL — ABNORMAL HIGH (ref 70–99)
Glucose-Capillary: 155 mg/dL — ABNORMAL HIGH (ref 70–99)
Glucose-Capillary: 132 mg/dL — ABNORMAL HIGH (ref 70–99)
Glucose-Capillary: 123 mg/dL — ABNORMAL HIGH (ref 70–99)
Glucose-Capillary: 111 mg/dL — ABNORMAL HIGH (ref 70–99)
Glucose-Capillary: 147 mg/dL — ABNORMAL HIGH (ref 70–99)

## 2022-02-08 LAB — CULTURE, BLOOD (ROUTINE X 2)
Culture: NO GROWTH
Culture: NO GROWTH
Special Requests: ADEQUATE
Special Requests: ADEQUATE

## 2022-02-08 SURGERY — DEBRIDEMENT, INGUINAL REGION
Anesthesia: General | Site: Groin | Laterality: Right

## 2022-02-08 SURGERY — DEBRIDEMENT, WOUND
Anesthesia: Choice | Laterality: Right

## 2022-02-08 MED ORDER — MIDAZOLAM HCL 2 MG/2ML IJ SOLN
INTRAMUSCULAR | Status: AC
Start: 2022-02-08 — End: ?
  Filled 2022-02-08: qty 2

## 2022-02-08 MED ORDER — 0.9 % SODIUM CHLORIDE (POUR BTL) OPTIME
TOPICAL | Status: DC | PRN
Start: 2022-02-08 — End: 2022-02-08
  Administered 2022-02-08: 1000 mL

## 2022-02-08 MED ORDER — FENTANYL CITRATE (PF) 250 MCG/5ML IJ SOLN
INTRAMUSCULAR | Status: AC
  Filled 2022-02-08: qty 5

## 2022-02-08 MED ORDER — PROPOFOL 10 MG/ML IV BOLUS
INTRAVENOUS | Status: DC | PRN
  Administered 2022-02-08: 130 mg via INTRAVENOUS

## 2022-02-08 MED ORDER — FENTANYL CITRATE (PF) 100 MCG/2ML IJ SOLN
25.0000 ug | INTRAMUSCULAR | Status: DC | PRN
  Administered 2022-02-08: 50 ug via INTRAVENOUS

## 2022-02-08 MED ORDER — FENTANYL CITRATE (PF) 250 MCG/5ML IJ SOLN
INTRAMUSCULAR | Status: DC | PRN
  Administered 2022-02-08: 50 ug via INTRAVENOUS

## 2022-02-08 MED ORDER — SENNOSIDES-DOCUSATE SODIUM 8.6-50 MG PO TABS: 1.0000 | ORAL_TABLET | Freq: Every day | ORAL | Status: DC

## 2022-02-08 MED ORDER — SODIUM CHLORIDE 0.9 % IV SOLN
INTRAVENOUS | Status: DC | PRN
  Administered 2022-02-08: 500 mL

## 2022-02-08 MED ORDER — CHLORHEXIDINE GLUCONATE 0.12 % MT SOLN: 15.0000 mL | Freq: Once | OROMUCOSAL | Status: DC

## 2022-02-08 MED ORDER — INSULIN ASPART 100 UNIT/ML IJ SOLN: 0.0000 [IU] | INTRAMUSCULAR | Status: DC | PRN

## 2022-02-08 MED ORDER — GENTAMICIN SULFATE 40 MG/ML IJ SOLN
INTRAMUSCULAR | Status: AC
  Filled 2022-02-08: qty 6

## 2022-02-08 MED ORDER — FENTANYL CITRATE (PF) 100 MCG/2ML IJ SOLN
INTRAMUSCULAR | Status: AC
  Filled 2022-02-08: qty 2

## 2022-02-08 MED ORDER — SODIUM CHLORIDE 0.9 % IV SOLN: INTRAVENOUS | Status: DC

## 2022-02-08 MED ORDER — ORAL CARE MOUTH RINSE: 15.0000 mL | Freq: Once | OROMUCOSAL | Status: DC

## 2022-02-08 MED ORDER — EPHEDRINE SULFATE-NACL 50-0.9 MG/10ML-% IV SOSY
PREFILLED_SYRINGE | INTRAVENOUS | Status: DC | PRN
  Administered 2022-02-08 (×2): 10 mg via INTRAVENOUS

## 2022-02-08 MED ORDER — GLYCOPYRROLATE PF 0.2 MG/ML IJ SOSY
PREFILLED_SYRINGE | INTRAMUSCULAR | Status: AC
  Filled 2022-02-08: qty 3

## 2022-02-08 MED ORDER — VANCOMYCIN HCL 1000 MG IV SOLR
INTRAVENOUS | Status: AC
  Filled 2022-02-08: qty 20

## 2022-02-08 MED ORDER — PHENYLEPHRINE HCL-NACL 20-0.9 MG/250ML-% IV SOLN
INTRAVENOUS | Status: DC | PRN
  Administered 2022-02-08: 50 ug/min via INTRAVENOUS

## 2022-02-08 MED ORDER — ACETAMINOPHEN 500 MG PO TABS
1000.0000 mg | ORAL_TABLET | Freq: Once | ORAL | Status: AC
Start: 2022-02-08 — End: 2022-02-08
  Administered 2022-02-08: 1000 mg via ORAL
  Filled 2022-02-08: qty 2

## 2022-02-08 MED ORDER — PHENYLEPHRINE 40 MCG/ML (10ML) SYRINGE FOR IV PUSH (FOR BLOOD PRESSURE SUPPORT)
PREFILLED_SYRINGE | INTRAVENOUS | Status: DC | PRN
  Administered 2022-02-08: 160 ug via INTRAVENOUS

## 2022-02-08 MED ORDER — ONDANSETRON HCL 4 MG/2ML IJ SOLN
INTRAMUSCULAR | Status: DC | PRN
  Administered 2022-02-08: 4 mg via INTRAVENOUS

## 2022-02-08 MED ORDER — LACTATED RINGERS IV SOLN: INTRAVENOUS | Status: AC

## 2022-02-08 MED ORDER — VANCOMYCIN HCL 1250 MG/250ML IV SOLN
1250.0000 mg | INTRAVENOUS | Status: DC
  Administered 2022-02-08: 1250 mg via INTRAVENOUS
  Filled 2022-02-08 (×2): qty 250

## 2022-02-08 MED ORDER — LIDOCAINE 2% (20 MG/ML) 5 ML SYRINGE
INTRAMUSCULAR | Status: DC | PRN
  Administered 2022-02-08: 100 mg via INTRAVENOUS

## 2022-02-08 MED ORDER — MIDAZOLAM HCL 2 MG/2ML IJ SOLN
INTRAMUSCULAR | Status: DC | PRN
  Administered 2022-02-08: 1 mg via INTRAVENOUS

## 2022-02-08 MED ORDER — ONDANSETRON HCL 4 MG/2ML IJ SOLN: 4.0000 mg | Freq: Once | INTRAMUSCULAR | Status: DC | PRN

## 2022-02-08 MED ORDER — CHLORHEXIDINE GLUCONATE 0.12 % MT SOLN
OROMUCOSAL | Status: AC
  Filled 2022-02-08: qty 15

## 2022-02-08 MED ORDER — POLYETHYLENE GLYCOL 3350 17 G PO PACK: 17.0000 g | PACK | Freq: Every day | ORAL | Status: DC

## 2022-02-08 SURGICAL SUPPLY — 43 items
BAG COUNTER SPONGE SURGICOUNT (BAG) ×2 IMPLANT
BAG SPNG CNTER NS LX DISP (BAG) ×1
BNDG CMPR MED 10X6 ELC LF (GAUZE/BANDAGES/DRESSINGS) ×1
BNDG ELASTIC 6X10 VLCR STRL LF (GAUZE/BANDAGES/DRESSINGS) ×1 IMPLANT
CANISTER SUCT 3000ML PPV (MISCELLANEOUS) ×2 IMPLANT
CANISTER WOUND CARE 500ML ATS (WOUND CARE) ×1 IMPLANT
COVER SURGICAL LIGHT HANDLE (MISCELLANEOUS) ×1 IMPLANT
DRAPE DERMATAC (DRAPES) ×1 IMPLANT
DRESSING VERAFLO CLEANS CC MED (GAUZE/BANDAGES/DRESSINGS) IMPLANT
DRSG PAD ABDOMINAL 8X10 ST (GAUZE/BANDAGES/DRESSINGS) ×2 IMPLANT
DRSG VAC ATS MED SENSATRAC (GAUZE/BANDAGES/DRESSINGS) IMPLANT
DRSG VAC ATS SM SENSATRAC (GAUZE/BANDAGES/DRESSINGS) IMPLANT
DRSG VERAFLO CLEANSE CC MED (GAUZE/BANDAGES/DRESSINGS) ×2
ELECT REM PT RETURN 9FT ADLT (ELECTROSURGICAL) ×2
ELECTRODE REM PT RTRN 9FT ADLT (ELECTROSURGICAL) ×1 IMPLANT
GAUZE SPONGE 4X4 12PLY STRL (GAUZE/BANDAGES/DRESSINGS) ×2 IMPLANT
GLOVE SRG 8 PF TXTR STRL LF DI (GLOVE) ×2 IMPLANT
GLOVE SURG POLYISO LF SZ8 (GLOVE) IMPLANT
GLOVE SURG UNDER POLY LF SZ8 (GLOVE) ×4
GOWN STRL REUS W/ TWL LRG LVL3 (GOWN DISPOSABLE) ×2 IMPLANT
GOWN STRL REUS W/TWL 2XL LVL3 (GOWN DISPOSABLE) ×2 IMPLANT
GOWN STRL REUS W/TWL LRG LVL3 (GOWN DISPOSABLE) ×4
GRAFT MYRIAD 3 LAYER 7X10 (Graft) ×1 IMPLANT
KIT BASIN OR (CUSTOM PROCEDURE TRAY) ×2 IMPLANT
KIT REMOVER STAPLE SKIN (MISCELLANEOUS) ×1 IMPLANT
KIT TURNOVER KIT B (KITS) ×2 IMPLANT
NS IRRIG 1000ML POUR BTL (IV SOLUTION) ×2 IMPLANT
PACK GENERAL/GYN (CUSTOM PROCEDURE TRAY) ×2 IMPLANT
PACK UNIVERSAL I (CUSTOM PROCEDURE TRAY) ×2 IMPLANT
PAD ARMBOARD 7.5X6 YLW CONV (MISCELLANEOUS) ×4 IMPLANT
PAD NEG PRESSURE SENSATRAC (MISCELLANEOUS) ×1 IMPLANT
POWDER MYRIAD MORCELLS 1000MG (Miscellaneous) ×1 IMPLANT
SPONGE T-LAP 18X18 ~~LOC~~+RFID (SPONGE) ×1 IMPLANT
STAPLER VISISTAT 35W (STAPLE) IMPLANT
SUT ETHILON 3 0 PS 1 (SUTURE) IMPLANT
SUT MNCRL AB 4-0 PS2 18 (SUTURE) ×3 IMPLANT
SUT VIC AB 2-0 CTB1 (SUTURE) IMPLANT
SUT VIC AB 3-0 SH 27 (SUTURE)
SUT VIC AB 3-0 SH 27X BRD (SUTURE) IMPLANT
SWABSTK COMLB BENZOIN TINCTURE (MISCELLANEOUS) ×2 IMPLANT
SYR BULB IRRIG 60ML STRL (SYRINGE) ×1 IMPLANT
TOWEL GREEN STERILE (TOWEL DISPOSABLE) ×2 IMPLANT
WATER STERILE IRR 1000ML POUR (IV SOLUTION) ×2 IMPLANT

## 2022-02-08 NOTE — Progress Notes (Signed)
?  Progress Note ? ? ? ?02/08/2022 ?11:28 AM ? ?Subjective:  no new complaints ? ? ?Vitals:  ? 02/08/22 0953 02/08/22 1057  ?BP: (!) 133/56 (!) 144/63  ?Pulse: 66 68  ?Resp: 17 20  ?Temp: 97.8 ?F (36.6 ?C) 98.5 ?F (36.9 ?C)  ?SpO2: 96% 98%  ? ?Physical Exam: ?Lungs:  non labored ?Incisions:  R groin incision dehiscence with nonviable tissue in wound bed and surrounding erythema ?Extremities:  R TMA healing well, some drainage from lateral aspect of TMA ?Neurologic: A&O ? ?CBC ?   ?Component Value Date/Time  ? WBC 4.0 02/08/2022 0115  ? RBC 3.60 (L) 02/08/2022 0115  ? HGB 9.9 (L) 02/08/2022 0115  ? HCT 30.1 (L) 02/08/2022 0115  ? PLT 200 02/08/2022 0115  ? MCV 83.6 02/08/2022 0115  ? MCH 27.5 02/08/2022 0115  ? MCHC 32.9 02/08/2022 0115  ? RDW 14.1 02/08/2022 0115  ? LYMPHSABS 1.4 02/03/2022 1947  ? MONOABS 0.4 02/03/2022 1947  ? EOSABS 0.2 02/03/2022 1947  ? BASOSABS 0.0 02/03/2022 1947  ? ? ?BMET ?   ?Component Value Date/Time  ? NA 133 (L) 02/08/2022 0115  ? K 4.5 02/08/2022 0115  ? CL 101 02/08/2022 0115  ? CO2 24 02/08/2022 0115  ? GLUCOSE 222 (H) 02/08/2022 0115  ? BUN 36 (H) 02/08/2022 0115  ? CREATININE 1.50 (H) 02/08/2022 0115  ? CALCIUM 8.7 (L) 02/08/2022 0115  ? GFRNONAA 52 (L) 02/08/2022 0115  ? GFRAA >60 02/15/2019 0455  ? ? ?INR ?   ?Component Value Date/Time  ? INR 1.0 02/03/2022 1947  ? ? ? ?Intake/Output Summary (Last 24 hours) at 02/08/2022 1128 ?Last data filed at 02/08/2022 0900 ?Gross per 24 hour  ?Intake 1641.87 ml  ?Output 325 ml  ?Net 1316.87 ml  ? ? ? ? ?Assessment/Plan:  66 y.o. male is s/p  R iliofemoral endarterectomy with bovine patch angioplasty and R TMA.  Patient was initially doing well and acute rehab, however on going home noted foul smell from the groin.  He has not been patient for several days as his on broad-spectrum antibiotics.  This is helped dramatically with significant reduction in erythema and induration.  The infection appears superficial on imaging.  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  He would be best served with wound debridement, VAC placement in an effort to keep the area clean 4. ? ?We will continue to watch the TMA. ?R TMA healing well - some clear drainage from lateral aspect; heel weightbearing only ?R groin erythema improving; non viable sloughing tissue in wound bed; continue wet to dry packing daily. Improving with antibiotics. ?Continue broad spectrum antibiotics; would also consider agent to cover fungal given appearance of R groin ?Plan is for R groin washout in OR today.  After discussing the risk and benefits of washout debridement, Jashun elected to proceed. ? ?Antonio [name]  ?[facility] ?[phone] ?02/08/2022 ?11:28 AM ? ? ? ?

## 2022-02-08 NOTE — Transfer of Care (Signed)
Immediate Anesthesia Transfer of Care Note ? ?Patient: Antonio [name]. ? ?Procedure(s) Performed: GROIN DEBRIDEMENT (Right: Groin) ? ?Patient Location: PACU ? ?Anesthesia Type:General ? ?Level of Consciousness: awake and alert  ? ?Airway & Oxygen Therapy: Patient Spontanous Breathing and Patient connected to nasal cannula oxygen ? ?Post-op Assessment: Report given to RN ? ?Post vital signs: Reviewed and stable ? ?Last Vitals:  ?Vitals Value Taken Time  ?BP 130/57 02/08/22 1301  ?Temp    ?Pulse 65 02/08/22 1305  ?Resp 15 02/08/22 1305  ?SpO2 95 % 02/08/22 1305  ?Vitals shown include unvalidated device data. ? ?Last Pain:  ?Vitals:  ? 02/08/22 1127  ?TempSrc:   ?PainSc: 8   ?   ? ?Patients Stated Pain Goal: 5 (02/08/22 1127) ? ?Complications: No notable events documented. ?

## 2022-02-08 NOTE — Anesthesia Procedure Notes (Signed)
Procedure Name: LMA Insertion ?Date/Time: 02/08/2022 12:05 PM ?Performed by: Epifanio Lesches, CRNA ?Pre-anesthesia Checklist: Patient identified, Emergency Drugs available, Suction available and Patient being monitored ?Patient Re-evaluated:Patient Re-evaluated prior to induction ?Oxygen Delivery Method: Circle System Utilized ?Preoxygenation: Pre-oxygenation with 100% oxygen ?Induction Type: IV induction ?Ventilation: Mask ventilation without difficulty ?LMA: LMA inserted ?LMA Size: 5.0 ?Number of attempts: 1 ?Airway Equipment and Method: Bite block ?Placement Confirmation: positive ETCO2 ?Tube secured with: Tape ?Dental Injury: Teeth and Oropharynx as per pre-operative assessment  ? ? ? ? ?

## 2022-02-08 NOTE — Progress Notes (Signed)
? ?HD#5 ?SUBJECTIVE:  ?Patient Summary: Antonio Masden. is a 66 y.o. 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 ? ?Overnight Events: No acute events overnight ? ?Interim History: This is hospital day 5 for this patient. He states that his pain is still there, but tolerable, and that he is feeling hungry. He has had no issues with urination. No BM this admission to his knowledge. ? ?OBJECTIVE:  ?Vital Signs: ?Vitals:  ? 02/07/22 0807 02/07/22 1708 02/07/22 2152 02/08/22 0517  ?BP: (!) 125/57 125/60 140/84 120/74  ?Pulse: 63 63 66 66  ?Resp: 18 18 18 18   ?Temp:  (!) 97.5 ?F (36.4 ?C) 97.7 ?F (36.5 ?C) 97.7 ?F (36.5 ?C)  ?TempSrc:  Oral Oral Oral  ?SpO2: 95% 93% 94% 94%  ?Weight:      ?Height:      ? ?Supplemental O2: Room Air ?SpO2: 94 % ? ?Filed Weights  ? 02/05/22 1000  ?Weight: 93 kg  ? ? ? ?Intake/Output Summary (Last 24 hours) at 02/08/2022 0726 ?Last data filed at 02/08/2022 0517 ?Gross per 24 hour  ?Intake 1881.87 ml  ?Output 325 ml  ?Net 1556.87 ml  ? ?Net IO Since Admission: 2,110.49 mL [02/08/22 0726] ? ?Physical Exam: ?General: Pleasant, well-appearing elderly male laying in bed. No acute distress. ?CV: RRR. No murmurs, rubs, or gallops.  ?Pulmonary: Lungs CTAB. Normal effort. No wheezing or rales. ?Abdominal: Soft, nontender, nondistended. Normal BS ?Extremities: S/p L AKA and s/p R transmetatarsal amputation.  ?Skin: Right groin wound with less surrounding erythema, does have some purulence within wound, covered in gauze.  ?Neuro: A&Ox3. Moves all extremities. No focal deficit. ?Psych: Normal mood and affect ? ? ? ? ?ASSESSMENT/PLAN:  ?Assessment: ?Principal Problem: ?  Wound dehiscence ?Active Problems: ?  Wound infection after surgery ?  Acute heart failure with preserved  ejection fraction (HFpEF) (HCC) ? ? ?Plan: ?#Cellulitis of right inguinal wound ?#Wound dehiscence ?Wound is erythematous with superficial exudate. There is non-viable sloughing tissue in the wound. Patient remains on broad spectrum antibiotics and will go to the OR with vascular surgery this afternoon for a washout of his right groin with VAC placement. Patient remains afebrile with no leukocytosis. Will consider anti-fungal coverage, but holding off at this time, as the patient remains stable and the erythema/infection do appear to be improving.  ?- Continue vanc and rocephin ?- Percocet 5-325 mg q4h  ?- Dilaudid 0.5 mg q6h prn  ?- Continue wet to dry packings ?- Wound care ? ?#AKI ?FeNa calculated at 0.5%, consistent with prerenal etiology of this patient's AKI. Cr continues to improve from 2>1.73>1.5 today. Will continue IV fluids ?- LR 100 cc/hr x 12 hrs ?- Daily BMP ?- Avoid nephrotoxins ? ?#CAD ?#PAD ?#HLD ?On aspirin 81 mg, plavix 75 mg, and lipitor 40 mg. Holding plavix.  ? ?#Hypertension ?On amlodipine 10 mg, coreg 25 mg bid, irbesartan 75 mg, hydralazine 75 mg tid at home- all of which were resumed here. Blood pressure remains stable/well controlled. ? ?Best Practice: ?Diet: NPO ?IVF: Fluids: LR 100 cc/hr x 12 hrs ?VTE: enoxaparin (LOVENOX) injection 40 mg Start: 02/04/22 2200 ?Code: Full ?AB: Vanc and Rocephin ?Therapy Recs: Home Health ?DISPO: Anticipated discharge in 1-2 days to Home pending Medical stability. ? ?Signature: ?Savreen Gebhardt, D.O.  ?Internal Medicine Resident, PGY-1 ?  [facility]  ?Pager: [phone] ?7:26 AM, 02/08/2022  ? ?Please contact the on call pager after 5 pm and on weekends at [phone]. ? ?

## 2022-02-08 NOTE — Hospital Course (Addendum)
#  Cellulitis of right inguinal wound ?#Wound dehiscence ?Patient presented with pain in the right inguinal region for 2 days after undergoing right iliofemoral endarterectomy with bovine pericardial patch angioplasty and right transmetatarsal amputation around one month ago. Wound showing erythema, pain and discharge is concerning for infection. CT pelvis did not show any fluid collection or abscess but did show induration of subcutaneous tissue. Blood cultures had no growth. During hospitalization, patient was given Vancomycin and Rocephin for his cellulitis. The erythema improved throughout his hospitalization and additionally, he underwent a right groin washout and debridement with vascular surgery on 3/16 and had a wound VAC placed at that time. Wound VAC to remain in place until 3/24 (has a follow up with vascular surgery at this time). He was discharged with 7 days of doxycycline and cefdinir, to complete a 10 day course of antibiotics since his wound VAC was placed. Patient was also set up with home health care, to help with the wound VAC. Also discharged with 5 days of percocet for post-op pain.  ? ?#Acute hypoxic respiratory failure ?#COPD ?Patient denied any shortness of breath and saturated well on room air. COPD is suspected in the setting of his tobacco use history, although he will likely need outpatient PFTs. ?  ?#CAD s/p stent placement ?#PAD s/p stenting and L AKA ?#HLD ?On aspirin 81 mg and plavix 75 mg daily. Lipitor 40 mg daily.  ?  ?#Hypertension ?On amlodipine 10 mg, coreg 25 mg bid, irbesartan 75 mg, hydralazine 75 mg tid at home. Blood pressure remains stable/well controlled. ? ? ?

## 2022-02-08 NOTE — Anesthesia Preprocedure Evaluation (Signed)
Anesthesia Evaluation  ?Patient identified by MRN, date of birth, ID band ?Patient awake ? ? ? ?Reviewed: ?Allergy & Precautions, NPO status , Patient's Chart, lab work & pertinent test results, reviewed documented beta blocker date and time  ? ?Airway ?Mallampati: II ? ?TM Distance: >3 FB ?Neck ROM: Full ? ? ? Dental ? ?(+) Teeth Intact, Dental Advisory Given ?  ?Pulmonary ?Patient abstained from smoking., former smoker,  ?  ?Pulmonary exam normal ?breath sounds clear to auscultation ? ? ? ? ? ? Cardiovascular ?hypertension, Pt. on medications and Pt. on home beta blockers ?+ angina + CAD, + Cardiac Stents and + Peripheral Vascular Disease  ?Normal cardiovascular exam ?Rhythm:Regular Rate:Normal ? ? ?  ?Neuro/Psych ?negative neurological ROS ?   ? GI/Hepatic ?Neg liver ROS, GERD  Medicated,  ?Endo/Other  ?diabetes, Type 2, Insulin Dependent ? Renal/GU ?Renal disease (AKI)  ? ?  ?Musculoskeletal ?Right groin wound  ? Abdominal ?  ?Peds ? Hematology ? ?(+) Blood dyscrasia (PLavix), anemia ,   ?Anesthesia Other Findings ?Day of surgery medications reviewed with the patient. ? Reproductive/Obstetrics ? ?  ? ? ? ? ? ? ? ? ? ? ? ? ? ?  ?  ? ? ? ? ? ? ? ? ?Anesthesia Physical ?Anesthesia Plan ? ?ASA: 4 ? ?Anesthesia Plan: General  ? ?Post-op Pain Management: Tylenol PO (pre-op)*  ? ?Induction: Intravenous ? ?PONV Risk Score and Plan: 2 and Dexamethasone, Ondansetron and Midazolam ? ?Airway Management Planned: LMA ? ?Additional Equipment:  ? ?Intra-op Plan:  ? ?Post-operative Plan: Extubation in OR ? ?Informed Consent: I have reviewed the patients History and Physical, chart, labs and discussed the procedure including the risks, benefits and alternatives for the proposed anesthesia with the patient or authorized representative who has indicated his/her understanding and acceptance.  ? ? ? ?Dental advisory given ? ?Plan Discussed with: CRNA ? ?Anesthesia Plan Comments:    ? ? ? ? ? ? ?Anesthesia Quick Evaluation ? ?

## 2022-02-08 NOTE — Progress Notes (Signed)
Pharmacy Antibiotic Note ? ?Antonio [name]. is a 66 y.o. male admitted on 02/03/2022 with R groin infection.  Pharmacy has been consulted for vancomycin dosing. Pt also on ceftriaxone. ? ?SCr trend down 1.5. Planning I&D 3/16 with [facility]. ? ?Vancomycin 1250 mg IV Q 24 hrs. Goal AUC 400-550. ?Expected AUC: 540 ?SCr used: 1.5 ? ? ?Plan: ?Rocephin per MD  ?Increase vancomycin 1250mg  q24 hr at noon ?Monitor cultures, clinical status, renal function, vancomycin levels ?Narrow abx as able and f/u duration  ? ?Height: 6' (182.9 cm) ?Weight: 93 kg (205 lb 0.4 oz) ?IBW/kg (Calculated) : 77.6 ? ?Temp (24hrs), Avg:97.6 ?F (36.4 ?C), Min:97.5 ?F (36.4 ?C), Max:97.7 ?F (36.5 ?C) ? ?Recent Labs  ?Lab 02/03/22 ?1947 02/04/22 ?1826 02/05/22 ?02/07/22 02/06/22 ?02/08/22 02/06/22 ?2042 02/07/22 ?0120 02/08/22 ?0115  ?WBC 5.1  --   --  5.4  --  4.6 4.0  ?CREATININE 1.26* 1.54* 1.82* 2.01*  --  1.73* 1.50*  ?LATICACIDVEN 1.8  --   --   --   --   --   --   ?VANCOTROUGH  --   --   --   --  22*  --   --   ? ?  ?Estimated Creatinine Clearance: 54.6 mL/min (A) (by C-G formula based on SCr of 1.5 mg/dL (H)).   ? ?Allergies  ?Allergen Reactions  ? Iodinated Contrast Media Hives, Rash and Other (See Comments)  ?  Blisters ?Staph ?Blisters / staph  ? Penicillins Other (See Comments)  ?  UNSPECIFIED REACTION  ?Has patient had a PCN reaction causing immediate rash, facial/tongue/throat swelling, SOB or lightheadedness with hypotension: No ?Has patient had a PCN reaction causing severe rash involving mucus membranes or skin necrosis: No ?Has patient had a PCN reaction that required hospitalization: No ?Has patient had a PCN reaction occurring within the last 10 years: No ?If all of the above answers are "NO", then may proceed with Cephalosporin use.  ? ? ?Antimicrobials this admission: ?flagyl 3/11 1x1  ?vancomycin 3/11 >>  ?cefepime 3/11 >> 3/13 ?Ceftriaxone 3/13 >> ? ?Vancomycin trough 3/14 = 22 (not SS) ? ?Microbiology results: ?3/11 BCx:  ngtd ? ?5/11, PharmD, FCCM ?Clinical Pharmacist ?Please see AMION for all Pharmacists' Contact Phone Numbers ?02/08/2022, 9:38 AM  ? ? ?

## 2022-02-08 NOTE — TOC Transition Note (Addendum)
Transition of Care (TOC) - CM/SW Discharge Note ? ? ?Patient Details  ?Name: Antonio [name]. ?MRN: [id number] ?Date of Birth: [birth year] ? ?Transition of Care (TOC) CM/SW Contact:  ?Marilu Favre, RN ?Phone Number: ?02/08/2022, 9:37 AM ? ? ?Clinical Narrative:    ? ?Spoke to daughter via phone and patient at bedside. Both aware unable to arrange [name] . Only agencies in network with patient's insurance are [facility] and [facility] , both unable to staff patient.  ? ?Per Olivia Mackie with [facility] other option is , patient has a teachable caregiver and MD documents measurements every 2 weeks patient ? ?Daughter Antonio [name] willing to be taught wound care .  ? ?Referral sent to [facility]  ?  ?Barriers to Discharge: Continued Medical Work up ? ? ?Patient Goals and CMS Choice ?Patient states their goals for this hospitalization and ongoing recovery are:: to return to home ?CMS Medicare.gov Compare Post Acute Care list provided to:: Patient ?Choice offered to / list presented to : Patient ? ?Discharge Placement ?  ?           ?  ?  ?  ?  ? ?Discharge Plan and Services ?  ?Discharge Planning Services: CM Consult ?Post Acute Care Choice: Home Health          ?DME Arranged: N/A ?DME Agency: NA ?  ?  ?  ?HH Arranged: PT, RN ?  ?  ?  ?  ? ?Social Determinants of Health (SDOH) Interventions ?  ? ? ?Readmission Risk Interventions ?Readmission Risk Prevention Plan 01/09/2022  ?Transportation Screening Complete  ?PCP or Specialist Appt within 5-7 Days (No Data)  ?Home Care Screening Complete  ?Medication Review (RN CM) Complete  ?Some recent data might be hidden  ? ? ? ? ? ?

## 2022-02-08 NOTE — Anesthesia Postprocedure Evaluation (Signed)
Anesthesia Post Note ? ?Patient: Antonio [name]. ? ?Procedure(s) Performed: GROIN DEBRIDEMENT (Right: Groin) ? ?  ? ?Patient location during evaluation: PACU ?Anesthesia Type: General ?Level of consciousness: awake and alert ?Pain management: pain level controlled ?Vital Signs Assessment: post-procedure vital signs reviewed and stable ?Respiratory status: spontaneous breathing, nonlabored ventilation, respiratory function stable and patient connected to nasal cannula oxygen ?Cardiovascular status: blood pressure returned to baseline and stable ?Postop Assessment: no apparent nausea or vomiting ?Anesthetic complications: no ? ? ?No notable events documented. ? ?Last Vitals:  ?Vitals:  ? 02/08/22 1330 02/08/22 1349  ?BP: (!) 118/57 (!) 116/58  ?Pulse: (!) 56 (!) 58  ?Resp: 14 16  ?Temp: (!) 36.4 ?C   ?SpO2: 96% 90%  ?  ?Last Pain:  ?Vitals:  ? 02/08/22 1127  ?TempSrc:   ?PainSc: 8   ? ? ?  ?  ?  ?  ?  ?  ? ?Collene Schlichter ? ? ? ? ?

## 2022-02-08 NOTE — Op Note (Signed)
? ? ?  NAME: Antonio [name].    MRN: [id number] ?DOB: 1956/02/27    DATE OF OPERATION: 02/08/2022 ? ?PREOP DIAGNOSIS:   ? ?Nonhealing right groin wound ? ?POSTOP DIAGNOSIS:   ? ?Same ? ?PROCEDURE:  ?  ?Right groin washout debridement, myriad morcell, myriad matrix placement ?Skin and soft tissue wound measuring 4 x 8 x 3 cm ? ?SURGEON: Broadus John ? ?ASSIST: Roxy Horseman ? ?ANESTHESIA: General ? ?EBL: 15 mL ? ?INDICATIONS:  ? ? Antonio [name]. is a 66 y.o. male 66 y.o. male is s/p  R iliofemoral endarterectomy with bovine patch angioplasty and R TMA.  Patient was initially doing well in acute rehab, however on going home, noted foul smell from the groin.  He has been an inpatient for several days on broad-spectrum antibiotics.  This has helped dramatically with significant reduction in erythema and induration.  The infection appears superficial on imaging.  Antonio [name] continues to struggle with his A1c, as well as poor Armed forces logistics/support/administrative officer.  He would be best served with wound debridement, VAC placement in an effort to keep the area clean while healing.  ? ?FINDINGS:  ? ?Skin and soft tissue wound, no abscess, no tracking to the artery ?Wound bed size: 4 x 8 x 3 cm.  Postdebridement healthy bleeding tissue encountered ? ?TECHNIQUE:  ? ?Patient was brought to the OR laid in the supine position.  General anesthesia was induced and the patient was prepped and draped in standard fashion. ? ?The case began with digital palpation of the right groin wound.  There was no tracking, no abscess.  Wound bed depth was limited to subcutaneous adipose tissue with wound bed depth measuring 3 cm.  The area was sharply debrided to healthy bleeding tissue.  Antibiotic laden saline was used for irrigation.  I elected to place myriad morcells, myriad matrix in the wound bed to promote wound healing.  Over this, a VAC sponge was used. ? ?After VAC sponge was placed, the case was terminated. ?The wound VAC remain in place  until next Friday, at which time it will be removed in clinic, and replaced with a new sponge. ? ?Antonio Burows, MD ?[facility] ? ?DATE OF DICTATION:   02/08/2022 ? ?

## 2022-02-09 ENCOUNTER — Encounter (HOSPITAL_COMMUNITY): Payer: Self-pay | Admitting: Vascular Surgery

## 2022-02-09 LAB — CBC
HCT: 32.3 % — ABNORMAL LOW (ref 39.0–52.0)
Hemoglobin: 10.1 g/dL — ABNORMAL LOW (ref 13.0–17.0)
MCH: 26.9 pg (ref 26.0–34.0)
MCHC: 31.3 g/dL (ref 30.0–36.0)
MCV: 85.9 fL (ref 80.0–100.0)
Platelets: 199 10*3/uL (ref 150–400)
RBC: 3.76 MIL/uL — ABNORMAL LOW (ref 4.22–5.81)
RDW: 14.3 % (ref 11.5–15.5)
WBC: 5.2 10*3/uL (ref 4.0–10.5)
nRBC: 0 % (ref 0.0–0.2)

## 2022-02-09 LAB — BASIC METABOLIC PANEL
Anion gap: 7 (ref 5–15)
BUN: 30 mg/dL — ABNORMAL HIGH (ref 8–23)
CO2: 24 mmol/L (ref 22–32)
Calcium: 8.8 mg/dL — ABNORMAL LOW (ref 8.9–10.3)
Chloride: 104 mmol/L (ref 98–111)
Creatinine, Ser: 1.25 mg/dL — ABNORMAL HIGH (ref 0.61–1.24)
GFR, Estimated: >60 mL/min (ref >60)
Glucose, Bld: 115 mg/dL — ABNORMAL HIGH (ref 70–99)
Potassium: 4.4 mmol/L (ref 3.5–5.1)
Sodium: 135 mmol/L (ref 135–145)

## 2022-02-09 LAB — GLUCOSE, CAPILLARY
Glucose-Capillary: 126 mg/dL — ABNORMAL HIGH (ref 70–99)
Glucose-Capillary: 141 mg/dL — ABNORMAL HIGH (ref 70–99)
Glucose-Capillary: 180 mg/dL — ABNORMAL HIGH (ref 70–99)
Glucose-Capillary: 130 mg/dL — ABNORMAL HIGH (ref 70–99)

## 2022-02-09 MED ORDER — OXYCODONE-ACETAMINOPHEN 5-325 MG PO TABS: 1.0000 | ORAL_TABLET | Freq: Four times a day (QID) | ORAL | Status: DC

## 2022-02-09 MED ORDER — LACTATED RINGERS IV SOLN: INTRAVENOUS | Status: AC

## 2022-02-09 MED ORDER — VANCOMYCIN HCL 1750 MG/350ML IV SOLN
1750.0000 mg | INTRAVENOUS | Status: DC
  Administered 2022-02-09 – 2022-02-10 (×2): 1750 mg via INTRAVENOUS
  Filled 2022-02-09 (×2): qty 350

## 2022-02-09 MED ORDER — OXYCODONE-ACETAMINOPHEN 5-325 MG PO TABS
1.0000 | ORAL_TABLET | ORAL | Status: DC
  Administered 2022-02-09 – 2022-02-10 (×8): 1 via ORAL
  Filled 2022-02-09 (×8): qty 1

## 2022-02-09 NOTE — Progress Notes (Addendum)
?  Progress Note ? ? ? ?02/09/2022 ?8:14 AM ?1 Day Post-Op ? ?Subjective:  no complaints ? ? ?Vitals:  ? 02/08/22 2107 02/09/22 0446  ?BP: (!) 121/58 (!) 144/73  ?Pulse: (!) 59 68  ?Resp: 17 17  ?Temp: (!) 97.5 ?F (36.4 ?C) (!) 97.5 ?F (36.4 ?C)  ?SpO2: 92% 93%  ? ?Physical Exam: ?Lungs:  non labored ?Incisions:  R groin with wound vac ?Extremities:  dressing left on R TMA ?Neurologic: A&O ? ?CBC ?   ?Component Value Date/Time  ? WBC 5.2 02/09/2022 0136  ? RBC 3.76 (L) 02/09/2022 0136  ? HGB 10.1 (L) 02/09/2022 0136  ? HCT 32.3 (L) 02/09/2022 0136  ? PLT 199 02/09/2022 0136  ? MCV 85.9 02/09/2022 0136  ? MCH 26.9 02/09/2022 0136  ? MCHC 31.3 02/09/2022 0136  ? RDW 14.3 02/09/2022 0136  ? LYMPHSABS 1.4 02/03/2022 1947  ? MONOABS 0.4 02/03/2022 1947  ? EOSABS 0.2 02/03/2022 1947  ? BASOSABS 0.0 02/03/2022 1947  ? ? ?BMET ?   ?Component Value Date/Time  ? NA 135 02/09/2022 0136  ? K 4.4 02/09/2022 0136  ? CL 104 02/09/2022 0136  ? CO2 24 02/09/2022 0136  ? GLUCOSE 115 (H) 02/09/2022 0136  ? BUN 30 (H) 02/09/2022 0136  ? CREATININE 1.25 (H) 02/09/2022 0136  ? CALCIUM 8.8 (L) 02/09/2022 0136  ? GFRNONAA >60 02/09/2022 0136  ? GFRAA >60 02/15/2019 0455  ? ? ?INR ?   ?Component Value Date/Time  ? INR 1.0 02/03/2022 1947  ? ? ? ?Intake/Output Summary (Last 24 hours) at 02/09/2022 0814 ?Last data filed at 02/09/2022 0111 ?Gross per 24 hour  ?Intake 825.2 ml  ?Output 590 ml  ?Net 235.2 ml  ? ? ? ?Assessment/Plan:  66 y.o. male is s/p R groin debridement and placement of myriad matrix 1 Day Post-Op ? ?[place] for discharge when home vac delivered ?Convert to broad spectrum p.o. antibiotic at discharge; patient will need to be on abx regimen until groin is healed ?Office will arrange visit in 1 week for vac change ? ?Dagoberto Ligas, PA-C ?[facility] ?[phone] ?02/09/2022 ?8:14 AM ? ?VASCULAR STAFF ADDENDUM: ?I have independently interviewed and examined the patient this morning.  ?I agree with the above.  ?Plan  for first [facility] dressing change next Friday in the office. ?He will need to bring his VAC dressing supplies ?Every other day dressing changes to the right TMA with a dry gauze and Ace wrap. ?Appreciate the help from primary team, social work and nursing to facilitate his discharge.  ? ?J. Melene Muller, MD ?[facility] ?Office Phone Number: [phone] ?02/09/2022 3:31 PM ? ? ? ?

## 2022-02-09 NOTE — Plan of Care (Signed)
  Problem: Pain Managment: Goal: General experience of comfort will improve Outcome: Not Progressing   Problem: Elimination: Goal: Will not experience complications related to bowel motility Outcome: Not Progressing   

## 2022-02-09 NOTE — Discharge Instructions (Signed)
Keep wound vac in right groin for 1 week until seen in vascular office ?

## 2022-02-09 NOTE — TOC Progression Note (Addendum)
Transition of Care (TOC) - Progression Note  ? ? ?Patient Details  ?Name: Antonio [name] [facility]. ?MRN: [id number] ?Date of Birth: 1956-06-21 ? ?Transition of Care (TOC) CM/SW Contact  ?Marilu Favre, RN ?Phone Number: ?02/09/2022, 9:44 AM ? ?Clinical Narrative:    ? ?Clarita Leber at bedside.  ? ?NCM explained called every agency listed for his address and either agency is not in network or they do not have staffing. NCM did benefit check the only two agencies in network are [place] and [employer] ([place]) , NCM has called [facility] Tommi Rumps) and [facility] Corene Cornea ) on Tuesday and Wednesday and was told both times they do not have the staffing to accept referral. Shirlean Mylar and Mel Almond both said [facility] before and that NCM must have called the wrong one.  ? ?Robin and Mel Almond raised their voices and were using foul language. Demanding a list of everyone involved in patient's care. NCM suggested patient can set up my chart account ( if he does not already have one) and request records. They want a list of everyone who has entered his room.  ? ? ?They also want to talk to NCM boss and NCM boss's boss.  ? ?NCM left room secure chatted attending and vascular and TOC supervisor , also made Director and AD on 6N aware.  ? ?Have asked nursing to arrange [facility] education  ? ?Reminded MD's VAC forms need to be signed if home [facility] still the plan  ? ?[place] with [facility] can now accpt for [facility] dressing changes , Monday Wednesday and Friday . [facility] cannot accept if infusion needed ? ?NCM confirmed no infusion needed. NCM called patient and let him know. NCM called Mel Almond and let her know. Mel Almond would still like to be taught VAC dressing changes prior to discharge  ? ?1500 Dr Virl Cagey will change VAC  for the first time Friday due to some special tissue matrix that was  placed at the time of surgery . Patient will need the transmetatarsal amputation site dressing also, which daughter will need to be taught .  NCM has updated Corene Cornea with [place]. [facility] requesting HHRN order state when [facility] is to start VAC dressing changes and how often VAC needs to be changed. Also will need instructions on the transmetatarsal amputation site dressing changes. Plan DC tomorrow . Olivia Mackie with [facility]M aware  ? ?Dr Virl Cagey clarified wound care : Home health for the right groin wound will staart Monday 3/27. Dr Virl Cagey  will do the first change next Friday in clinic. He will need to be set up with home health prior so he has supplies in case VAC issues arise. he will then bring supplies to the office for VAC change. The TMA dressing is just a dry dressing with ACE wrap which can be done every/other day  ? ?Corene Cornea with [facility] updated .  ?Barriers to Discharge: Continued Medical Work up ? ?Expected Discharge Plan and Services ?  ?  ?Discharge Planning Services: CM Consult ?Post Acute Care Choice: Home Health ?Living arrangements for the past 2 months: [place] ?                ?DME Arranged: N/A ?DME Agency: NA ?  ?  ?  ?HH Arranged: PT, RN ?  ?  ?  ?  ? ? ?Social Determinants of Health (SDOH) Interventions ?  ? ?Readmission Risk Interventions ?Readmission Risk Prevention Plan 01/09/2022  ?Transportation Screening Complete  ?PCP  or Specialist Appt within 5-7 Days (No Data)  ?Home Care Screening Complete  ?Medication Review (RN CM) Complete  ?Some recent data might be hidden  ? ? ?

## 2022-02-09 NOTE — Progress Notes (Signed)
Occupational Therapy Treatment ?Patient Details ?Name: Antonio [name]. ?MRN: [id number] ?DOB: 06-23-1956 ?Today's Date: 02/09/2022 ? ? ?History of present illness 66 yo male presenting to ED with pain and redness of his R groin area. Pt had R iliofemoral endarterectomy and TMA on 2/6. PMH including HTN, diabetes, PAD s/p LSFA stenting, s/p left BKA (2019), and tobacco use disorder. ?  ?OT comments ? Pt reports MD told him "to not move my butt off of this bed." Pt agreed only to participate in grooming at bed level and changing gown. Educated pt in WB through R heel only, recommended use of prosthesis for transfers, but pt reporting it creates increased pain. Pt able to verbalize performing bathing and dressing in sitting, leaning side to side.  ? ?Recommendations for follow up therapy are one component of a multi-disciplinary discharge planning process, led by the attending physician.  Recommendations may be updated based on patient status, additional functional criteria and insurance authorization. ?   ?Follow Up Recommendations ? Home health OT  ?  ?Assistance Recommended at Discharge Intermittent Supervision/Assistance  ?Patient can return home with the following ? A little help with walking and/or transfers;A little help with bathing/dressing/bathroom;Assistance with cooking/housework;Direct supervision/assist for medications management;Help with stairs or ramp for entrance;Assist for transportation ?  ?Equipment Recommendations ? None recommended by OT  ?  ?Recommendations for Other Services   ? ?  ?Precautions / Restrictions Precautions ?Precautions: Fall ?Precaution Comments: R TMA; prothesis for L prior BKA in room ?Restrictions ?Weight Bearing Restrictions: Yes ?RLE Weight Bearing: Partial weight bearing ?RLE Partial Weight Bearing Percentage or Pounds: heel ?Other Position/Activity Restrictions: per vascular surgery notes  ? ? ?  ? ?Mobility Bed Mobility ?  ?  ?  ?  ?  ?  ?  ?General bed mobility  comments: declined any mobility ?  ? ?Transfers ?  ?  ?  ?  ?  ?  ?  ?  ?  ?  ?  ?  ?Balance   ?  ?  ?  ?  ?  ?  ?  ?  ?  ?  ?  ?  ?  ?  ?  ?  ?  ?  ?   ? ?ADL either performed or assessed with clinical judgement  ? ?ADL Overall ADL's : Needs assistance/impaired ?  ?  ?Grooming: Wash/dry hands;Wash/dry face;Oral care;Brushing hair;Bed level;Set up ?  ?  ?  ?  ?  ?Upper Body Dressing : Set up;Bed level ?  ?  ?  ?  ?  ?  ?  ?  ?  ?  ?General ADL Comments: pt states he only uses his prosthesis for car transfers, educated pt that he is to place weight only through heel of R foot ?  ? ?Extremity/Trunk Assessment   ?  ?  ?  ?  ?  ? ?Vision   ?  ?  ?Perception   ?  ?Praxis   ?  ? ?Cognition Arousal/Alertness: Awake/alert ?Behavior During Therapy: [name] for tasks assessed/performed ?Overall Cognitive Status: No family/caregiver present to determine baseline cognitive functioning ?  ?  ?  ?  ?  ?  ?  ?  ?  ?  ?  ?  ?  ?  ?  ?  ?General Comments: verbose, able to state how he is going to manage transfers, sponge bathing and dressing at sink, pt states dr told him not to move and risk wound vac becoming loose ?  ?  ?   ?  Exercises   ? ?  ?Shoulder Instructions   ? ? ?  ?General Comments    ? ? ?Pertinent Vitals/ Pain       Pain Assessment ?Pain Assessment: Faces ?Faces Pain Scale: Hurts a little bit ?Pain Location: Groin ?Pain Descriptors / Indicators: Discomfort ?Pain Intervention(s): Monitored during session ? ?Home Living   ?  ?  ?  ?  ?  ?  ?  ?  ?  ?  ?  ?  ?  ?  ?  ?  ?  ?  ? ?  ?Prior Functioning/Environment    ?  ?  ?  ?   ? ?Frequency ? Min 2X/week  ? ? ? ? ?  ?Progress Toward Goals ? ?OT Goals(current goals can now be found in the care plan section) ? Progress towards OT goals: Not progressing toward goals - comment ? ?Acute Rehab OT Goals ?OT Goal Formulation: With patient ?Time For Goal Achievement: 02/18/22 ?Potential to Achieve Goals: Good  ?Plan Discharge plan remains appropriate   ? ?Co-evaluation ? ? ?   ?  ?  ?   ?  ? ?  ?AM-PAC OT "6 Clicks" Daily Activity     ?Outcome Measure ? ? Help from another person eating meals?: None ?Help from another person taking care of personal grooming?: A Little ?Help from another person toileting, which includes using toliet, bedpan, or urinal?: A Little ?Help from another person bathing (including washing, rinsing, drying)?: A Little ?Help from another person to put on and taking off regular upper body clothing?: None ?Help from another person to put on and taking off regular lower body clothing?: A Little ?6 Click Score: 20 ? ?  ?End of Session   ? ?OT Visit Diagnosis: Unsteadiness on feet (R26.81);Other abnormalities of gait and mobility (R26.89);Muscle weakness (generalized) (M62.81);Pain ?  ?Activity Tolerance Other (comment) (self limiting) ?  ?Patient Left in bed;with call bell/phone within reach;with bed alarm set ?  ?Nurse Communication   ?  ? ?   ? ?Time: [id number] ?OT Time Calculation (min): 25 min ? ?Charges: OT General Charges ?$OT Visit: 1 Visit ?OT Treatments ?$Self Care/Home Management : 23-37 mins ? ?Antonio [name], OTR/L ?Acute Rehabilitation Services ?Pager: [phone] ?Office: [phone] Antonio [name] ?02/09/2022, 10:44 AM ?

## 2022-02-09 NOTE — Progress Notes (Signed)
Pharmacy Antibiotic Note ? ?Antonio [name]. is a 66 y.o. male admitted on 02/03/2022 with R groin infection.  Pharmacy has been consulted for vancomycin dosing. Pt also on ceftriaxone. ? ?SCr continues to trend down 1.5> 1.25. ? ?Vancomycin 1750 mg IV Q 24 hrs. Goal AUC 400-550. ?Expected AUC: 480 ?SCr used: 1.25 ? ? ?Plan: ?Rocephin per MD  ?Increase vancomycin 1750mg  q24 hr at noon ?Monitor cultures, clinical status, renal function, vancomycin levels ?Narrow abx as able and f/u duration  ? ?Height: 6' (182.9 cm) ?Weight: 86.2 kg (190 lb 0.6 oz) ?IBW/kg (Calculated) : 77.6 ? ?Temp (24hrs), Avg:97.7 ?F (36.5 ?C), Min:97.5 ?F (36.4 ?C), Max:98.5 ?F (36.9 ?C) ? ?Recent Labs  ?Lab 02/03/22 ?1947 02/04/22 ?1826 02/05/22 ?02/07/22 02/06/22 ?02/08/22 02/06/22 ?2042 02/07/22 ?0120 02/08/22 ?0115 02/09/22 ?0136  ?WBC 5.1  --   --  5.4  --  4.6 4.0 5.2  ?CREATININE 1.26*   < > 1.82* 2.01*  --  1.73* 1.50* 1.25*  ?LATICACIDVEN 1.8  --   --   --   --   --   --   --   ?VANCOTROUGH  --   --   --   --  22*  --   --   --   ? < > = values in this interval not displayed.  ? ?  ?Estimated Creatinine Clearance: 65.5 mL/min (A) (by C-G formula based on SCr of 1.25 mg/dL (H)).   ? ?Allergies  ?Allergen Reactions  ? Iodinated Contrast Media Hives, Rash and Other (See Comments)  ?  Blisters ?Staph ?Blisters / staph  ? Penicillins Other (See Comments)  ?  UNSPECIFIED REACTION  ?Has patient had a PCN reaction causing immediate rash, facial/tongue/throat swelling, SOB or lightheadedness with hypotension: No ?Has patient had a PCN reaction causing severe rash involving mucus membranes or skin necrosis: No ?Has patient had a PCN reaction that required hospitalization: No ?Has patient had a PCN reaction occurring within the last 10 years: No ?If all of the above answers are "NO", then may proceed with Cephalosporin use.  ? ? ?Antimicrobials this admission: ?flagyl 3/11 1x1  ?vancomycin 3/11 >>  ?cefepime 3/11 >> 3/13 ?Ceftriaxone 3/13  >> ? ?Vancomycin trough 3/14 = 22 (not SS) ? ?Microbiology results: ?3/11 BCx: ngtd ? ?5/11, PharmD, FCCM ?Clinical Pharmacist ?Please see AMION for all Pharmacists' Contact Phone Numbers ?02/09/2022, 8:11 AM  ? ? ?

## 2022-02-09 NOTE — Progress Notes (Signed)
? ?HD#6 ?SUBJECTIVE:  ?Patient Summary: Antonio [name]. is a 66 y.o. 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 ? ?Overnight Events: No acute events overnight. ? ?Interim History: This is hospital day 6 for this patient who was seen and evaluated at the bedside this morning. He states that his pain is okay and he is eating and drinking fine. He has not had a BM in last day. He is not ready to go home today because he is still in a lot of pain, not able to sit up after surgery in bed like he was before procedure. His pain makes him want to stay.  ? ?OBJECTIVE:  ?Vital Signs: ?Vitals:  ? 02/08/22 1349 02/08/22 2107 02/09/22 0446 02/09/22 0447  ?BP: (!) 116/58 (!) 121/58 (!) 144/73   ?Pulse: (!) 58 (!) 59 68   ?Resp: 16 17 17    ?Temp:  (!) 97.5 ?F (36.4 ?C) (!) 97.5 ?F (36.4 ?C)   ?TempSrc:  Oral Oral   ?SpO2: 90% 92% 93%   ?Weight:    86.2 kg  ?Height:      ? ?Supplemental O2: Room Air ?SpO2: 93 % ?O2 Flow Rate (L/min): 2 L/min ? ?Filed Weights  ? 02/08/22 1057 02/08/22 1127 02/09/22 0447  ?Weight: 90.7 kg 90.7 kg 86.2 kg  ? ? ? ?Intake/Output Summary (Last 24 hours) at 02/09/2022 0727 ?Last data filed at 02/09/2022 0111 ?Gross per 24 hour  ?Intake 825.2 ml  ?Output 590 ml  ?Net 235.2 ml  ? ?Net IO Since Admission: 2,345.69 mL [02/09/22 0727] ? ?Physical Exam: ?General: Pleasant, well-appearing elderly male laying in bed. No acute distress. ?CV: RRR. No murmurs, rubs, or gallops.  ?Pulmonary: Lungs CTAB. Normal effort. No wheezing or rales. ?Abdominal: Soft, nontender, nondistended. Normal bowel sounds. ?Extremities: S/p L AKA and s/p R transmetatarsal amputation.  ?Skin: Right groin wound with less surrounding erythema. Covered in bandage with wound VAC in place.  ?Neuro: A&Ox3. No focal  deficit. ?Psych: Normal mood and affect ? ? ?ASSESSMENT/PLAN:  ?Assessment: ?Principal Problem: ?  Wound dehiscence ?Active Problems: ?  Wound infection after surgery ?  Acute heart failure with preserved ejection fraction (HFpEF) (HCC) ? ? ?Plan: ?#Cellulitis of right inguinal wound s/p debridement ?#Wound dehiscence  ?Patient underwent right groin washout debridement with vascular surgery yesterday and had a wound VAC placed at that time. Wound VAC to remain in place until 3/24 (will be removed in vascular surgery clinic). Can likely discharge to home this weekend once pain is under better control.  ?- Continue vanc and rocephin, will transition to po antibiotics at discharge ?- Percocet 5-325 mg q4h - scheduled ?- Dilaudid 0.5 mg q6h prn for breakthrough pain  ?- Home health RN for wound VAC changes ? ?#AKI, improving ?AKI is prerenal secondary to limited po intake. Cr improved to 1.25 today. ?- Daily BMP ?- LR 100 cc/hr x 10 hrs ?- Avoid nephrotoxins ? ?#CAD ?#PAD ?#HLD ?On aspirin 81 mg, plavix 75 mg, and lipitor 40 mg. Holding plavix.  ?  ?#Hypertension ?On amlodipine 10 mg, coreg 25 mg bid, irbesartan 75 mg, hydralazine 75 mg tid at home- all of which were resumed here. Blood pressure remains stable/well controlled. ? ? ?Best Practice: ?Diet: Diabetic diet ?IVF: Fluids: LR 100 cc/hr ?VTE: enoxaparin (LOVENOX)  injection 40 mg Start: 02/04/22 2200 ?Code: Full ?AB: Vanc, rocephin ?Therapy Recs: Home Health ?DISPO: Anticipated discharge in 1-2 days to Home pending Medical stability. ? ?Signature: ?Shadell Brenn, D.O.  ?Internal Medicine Resident, PGY-1 ?[facility]  ?Pager: [phone] ?7:27 AM, 02/09/2022  ? ?Please contact the on call pager after 5 pm and on weekends at [phone]. ? ?

## 2022-02-10 LAB — BASIC METABOLIC PANEL
Anion gap: 7 (ref 5–15)
BUN: 25 mg/dL — ABNORMAL HIGH (ref 8–23)
CO2: 25 mmol/L (ref 22–32)
Calcium: 8.7 mg/dL — ABNORMAL LOW (ref 8.9–10.3)
Chloride: 108 mmol/L (ref 98–111)
Creatinine, Ser: 1.13 mg/dL (ref 0.61–1.24)
GFR, Estimated: >60 mL/min (ref >60)
Glucose, Bld: 101 mg/dL — ABNORMAL HIGH (ref 70–99)
Potassium: 4.5 mmol/L (ref 3.5–5.1)
Sodium: 140 mmol/L (ref 135–145)

## 2022-02-10 LAB — CBC
HCT: 32.3 % — ABNORMAL LOW (ref 39.0–52.0)
Hemoglobin: 10.1 g/dL — ABNORMAL LOW (ref 13.0–17.0)
MCH: 26.9 pg (ref 26.0–34.0)
MCHC: 31.3 g/dL (ref 30.0–36.0)
MCV: 86.1 fL (ref 80.0–100.0)
Platelets: 194 10*3/uL (ref 150–400)
RBC: 3.75 MIL/uL — ABNORMAL LOW (ref 4.22–5.81)
RDW: 14.3 % (ref 11.5–15.5)
WBC: 4.8 10*3/uL (ref 4.0–10.5)
nRBC: 0 % (ref 0.0–0.2)

## 2022-02-10 LAB — GLUCOSE, CAPILLARY
Glucose-Capillary: 96 mg/dL (ref 70–99)
Glucose-Capillary: 150 mg/dL — ABNORMAL HIGH (ref 70–99)

## 2022-02-10 MED ORDER — POLYETHYLENE GLYCOL 3350 17 G PO PACK: 17.0000 g | PACK | Freq: Every day | ORAL | 0 refills | Status: DC

## 2022-02-10 MED ORDER — OXYCODONE-ACETAMINOPHEN 5-325 MG PO TABS
1.0000 | ORAL_TABLET | ORAL | 0 refills | Status: AC | PRN
Start: 2022-02-10 — End: 2022-02-15

## 2022-02-10 MED ORDER — DOXYCYCLINE HYCLATE 50 MG PO CAPS: 100.0000 mg | ORAL_CAPSULE | Freq: Two times a day (BID) | ORAL | 0 refills | Status: AC

## 2022-02-10 MED ORDER — CEFDINIR 300 MG PO CAPS: 300.0000 mg | ORAL_CAPSULE | Freq: Two times a day (BID) | ORAL | 0 refills | Status: AC

## 2022-02-10 MED ORDER — ACETAMINOPHEN 325 MG PO TABS
325.0000 mg | ORAL_TABLET | Freq: Four times a day (QID) | ORAL | Status: AC | PRN
Start: 2022-02-10 — End: 2022-03-12

## 2022-02-10 NOTE — Progress Notes (Signed)
Called by primary team to troubleshoot vac. ?VAC had been reset overnight and was alarming. ?VAC restarted. Good seal and suction. ?No home VAC at bedside. ?Needs home VAC before he can be discharged safely. ? ?Rande Brunt. Lenell Antu, MD ?[facility] [place] ?Office Phone Number: [phone] ?02/10/2022 12:33 PM ? ?

## 2022-02-10 NOTE — Discharge Summary (Signed)
? ?Name: Antonio Richardsaniel Watson Azam Jr. ?MRN: [id number] ?DOB: [birth year] 66 y.o. ?PCP: [name], [name], NP ? ?Date of Admission: 02/03/2022  6:36 PM ?Date of Discharge:  02/10/2022 ?Attending Physician: Dr.  Lafonda MossesMachen ? ?DISCHARGE DIAGNOSIS:  ?Primary Problem: Wound dehiscence  ? ?Hospital Problems: ?Principal Problem: ?  Wound dehiscence ?Active Problems: ?  Wound infection after surgery ?  Acute heart failure with preserved ejection fraction (HFpEF) (HCC) ?  ? ?DISCHARGE MEDICATIONS:  ? ?Allergies as of 02/10/2022   ? ?   Reactions  ? Iodinated Contrast Media Hives, Rash, Other (See Comments)  ? Blisters ?Staph ?Blisters / staph  ? Penicillins Other (See Comments)  ? UNSPECIFIED REACTION  ?Has patient had a PCN reaction causing immediate rash, facial/tongue/throat swelling, SOB or lightheadedness with hypotension: No ?Has patient had a PCN reaction causing severe rash involving mucus membranes or skin necrosis: No ?Has patient had a PCN reaction that required hospitalization: No ?Has patient had a PCN reaction occurring within the last 10 years: No ?If all of the above answers are "NO", then may proceed with Cephalosporin use.  ? ?  ? ?  ?Medication List  ?  ? ?STOP taking these medications   ? ?HYDROmorphone 2 MG tablet ?Commonly known as: DILAUDID ?  ? ?  ? ?TAKE these medications   ? ?acetaminophen 325 MG tablet ?Commonly known as: TYLENOL ?Take 1-2 tablets (325-650 mg total) by mouth every 6 (six) hours as needed for mild pain. ?What changed: when to take this ?  ?amLODipine 10 MG tablet ?Commonly known as: NORVASC ?Take 1 tablet (10 mg total) by mouth daily. ?  ?aspirin EC 81 MG tablet ?Take 81 mg by mouth daily. ?  ?atorvastatin 40 MG tablet ?Commonly known as: LIPITOR ?Take 1 tablet (40 mg total) by mouth daily at 6 PM. ?  ?carvedilol 25 MG tablet ?Commonly known as: COREG ?Take 1 tablet (25 mg total) by mouth 2 (two) times daily with a meal. ?  ?cefdinir 300 MG capsule ?Commonly known as: OMNICEF ?Take 1 capsule (300 mg  total) by mouth 2 (two) times daily for 7 days. ?  ?clopidogrel 75 MG tablet ?Commonly known as: PLAVIX ?Take 1 tablet (75 mg total) by mouth daily. ?  ?diclofenac Sodium 1 % Gel ?Commonly known as: VOLTAREN ?Apply 2 g topically 4 (four) times daily. ?What changed:  ?when to take this ?reasons to take this ?  ?doxycycline 50 MG capsule ?Commonly known as: VIBRAMYCIN ?Take 2 capsules (100 mg total) by mouth 2 (two) times daily for 7 days. ?  ?furosemide 40 MG tablet ?Commonly known as: Lasix ?Take 1 tablet (40 mg total) by mouth daily. ?  ?hydrALAZINE 25 MG tablet ?Commonly known as: APRESOLINE ?Take 1 tablet (25 mg total) by mouth every 8 (eight) hours. ?  ?Insulin Syringe-Needle U-100 30G X 1/2" 0.5 ML Misc ?Use with Levemir ?  ?irbesartan 75 MG tablet ?Commonly known as: AVAPRO ?Take 1 tablet (75 mg total) by mouth daily. ?  ?Levemir 100 UNIT/ML injection ?Generic drug: insulin detemir ?Inject 0.24 mLs (24 Units total) into the skin at bedtime. ?  ?oxyCODONE-acetaminophen 5-325 MG tablet ?Commonly known as: PERCOCET/ROXICET ?Take 1 tablet by mouth every 4 (four) hours as needed for up to 5 days for severe pain. ?  ?pantoprazole 40 MG tablet ?Commonly known as: PROTONIX ?Take 1 tablet (40 mg total) by mouth daily. ?  ?polyethylene glycol 17 g packet ?Commonly known as: MIRALAX / GLYCOLAX ?Take 17 g by mouth daily. ?  Start taking on: February 11, 2022 ?  ? ?  ? ?  ?  ? ? ?  ?Durable Medical Equipment  ?(From admission, onward)  ?  ? ? ?  ? ?  Start     Ordered  ? 02/08/22 1348  For home use only DME Negative pressure wound device  Once       ?Comments: Vac will be maintained for 7 days.  Do not remove.  VVS will remove the vac. ?4 x 8 x 3 cm deep  ?Question Answer Comment  ?Frequency of dressing change Other see comments   ?Length of need 6 Months   ?Dressing type Foam   ?Amount of suction 125 mm/Hg   ?Pressure application Continuous pressure   ?Supplies 10 canisters and 15 dressings per month for duration of therapy    ?  ? 02/08/22 1347  ? ?  ?  ? ?  ? ? ?  ?Discharge Care Instructions  ?(From admission, onward)  ?  ? ? ?  ? ?  Start     Ordered  ? 02/10/22 0000  Discharge wound care:       ?Comments: Every other day dressing changes to the right transmetatarsal amputation site with a dry gauze and Ace wrap.  ? 02/10/22 1329  ? ?  ?  ? ?  ? ? ?DISPOSITION AND FOLLOW-UP:  ?AntonioAntonio [name]. was discharged from [facility] in Fair condition. At the hospital follow up visit please address: ? ?Right inguinal wound: Discharged with 7 days of cefdinir and doxycyline and 5 days of prn percocet. Ensure vascular surgery follow up and home health care established. Wound VAC to remain in place as per vascular surgery, with patient advised to see them on 3/24. ? ?Probable COPD: Patient likely has underlying COPD with his tobacco use history. Would recommend outpatient PFTs ? ?Follow-up Recommendations: ?Consults: Vascular surgery ?Labs: Basic Metabolic Profile ?Studies: none ?Medications: Cefdinir and doxycycline x 7 days (to complete 10 day course of antibiotics since wound VAC placed) ? ?Percocet 5-325 mg q4h prn x 5 days ? ?Follow-up Appointments: ? Follow-up Information   ? ? [facility] Follow up in 1 week(s).   ?Specialty: Vascular Surgery ?Contact information: ?[address] ?[place] [address] ?[phone] ? ?  ?  ? ? [facility] Follow up.   ?Why: the office will call to schedule home health nursing visits ?Contact information: ?[address] ?[place], [place] [address] ?[phone] ? ?  ?  ? ? Rollene Rotunda, MD .   ?Specialty: Cardiology ?Contact information: ?[address] ?[address] ?[place] [address] ?[phone] ? ? ?  ?  ? ? Claiborne Rigg, NP Follow up on 03/05/2022.   ?Specialty: Nurse Practitioner ?Why: 2:30 appointment. Call to make the appoitnment sooner ?Contact information: ?[address] [place] ?[address] ?[place] [address] ?[phone] ? ? ?  ?  ? ?  ?  ? ?  ? ? ?HOSPITAL COURSE:  ?Patient Summary: ?#Cellulitis of right inguinal wound ?#Wound dehiscence ?Patient presented with pain in the right inguinal region for 2 days after undergoing right iliofemoral endarterectomy with bovine pericardial patch angioplasty and right transmetatarsal amputation around one month ago. Wound showing erythema, pain and discharge is concerning for infection. CT pelvis did not show any fluid collection or abscess but did show induration of subcutaneous tissue. Blood cultures had no growth. During hospitalization, patient was given Vancomycin and  Rocephin for his cellulitis. The erythema improved throughout his hospitalization and additionally, he underwent a right groin washout and debridement with vascular surgery on 3/16 and had a wound VAC placed at that time. Wound VAC to remain in place until 3/24 (has a follow up with vascular surgery at this time). He was discharged with 7 days of doxycycline and cefdinir, to complete a 10 day course of antibiotics since his wound VAC was placed. Patient was also set up with home health care, to help with the wound VAC. Also discharged with 5 days of percocet for post-op pain.  ? ?#Acute hypoxic respiratory failure ?#COPD ?Patient denied any shortness of breath and saturated well on room air. COPD is suspected in the setting of his tobacco use history, although he will likely need outpatient PFTs. ?  ?#CAD s/p stent placement ?#PAD s/p stenting and L AKA ?#HLD ?On aspirin 81 mg and plavix 75 mg daily. Lipitor 40 mg daily.  ?  ?#Hypertension ?On amlodipine 10 mg, coreg 25 mg bid, irbesartan 75 mg, hydralazine 75 mg tid at home. Blood pressure remains stable/well controlled. ? ?  ? ?DISCHARGE INSTRUCTIONS:  ? ?Discharge Instructions   ? ? Call MD for:  redness, tenderness, or signs of infection (pain, swelling, redness, odor or green/yellow discharge around incision site)   Complete by: As  directed ?  ? Call MD for:  severe uncontrolled pain   Complete by: As directed ?  ? Call MD for:  temperature >100.4   Complete by: As directed ?  ? Diet - low sodium heart healthy   Complete by: As directed ?  ?

## 2022-02-10 NOTE — Progress Notes (Signed)
Discharge instructions reviewed with patient. Patient hooked up to home wound vac and teaching done with patient and his daughter Fredric Mare. Because of wound vac placement in groin, patient educated on changing/shifting position if "blockage" error comes  up and checking to make sure line is not clamped. Wife also present to ask questions, specifically in regards to when [facility] RN will be at house to see patient. Writer followed up with Wendi CM and relayed info that someone would be out to see patient this coming week. ?

## 2022-02-10 NOTE — TOC Transition Note (Signed)
Transition of Care (TOC) - CM/SW Discharge Note ? ? ?Patient Details  ?Name: Antonio [name]. ?MRN: [id number] ?Date of Birth: [birth year] ? ?Transition of Care (TOC) CM/SW Contact:  ?Bess Kinds, RN ?Phone Number: [phone] ?02/10/2022, 12:35 PM ? ? ?Clinical Narrative:    ? ?Notified of patient's readiness to transition home pending delivery of the wound vac. Contacted French Ana at [facility] - patient does have insurance auth. Home wound vac retrieved and delivered to patient at the bedside. Proof of Delivery (POD) signed by patient and faxed to [facility] - receipt confirmed. Notified Barbara Cower at [employer] of patient transition home.  ? ?Final next level of care: Home w Home Health Services ?Barriers to Discharge: No Barriers Identified ? ? ?Patient Goals and CMS Choice ?Patient states their goals for this hospitalization and ongoing recovery are:: return home ?CMS Medicare.gov Compare Post Acute Care list provided to:: Patient ?Choice offered to / list presented to : Patient ? ?Discharge Placement ?  ?           ?  ?  ?  ?  ? ?Discharge Plan and Services ?  ?Discharge Planning Services: CM Consult ?Post Acute Care Choice: Home Health          ?DME Arranged: Negative pressure wound device ?DME Agency: [facility] ?Date DME Agency Contacted: 02/10/22 ?Time DME Agency Contacted: 1234 ?Representative spoke with at DME Agency: French Ana ?HH Arranged: RN ?HH Agency: [facility] ?Date HH Agency Contacted: 02/10/22 ?Time HH Agency Contacted: 1235 ?Representative spoke with at [name] Agency: Barbara Cower ? ?Social Determinants of Health (SDOH) Interventions ?  ? ? ?Readmission Risk Interventions ?Readmission Risk Prevention Plan 01/09/2022  ?Transportation Screening Complete  ?PCP or Specialist Appt within 5-7 Days (No Data)  ?Home Care Screening Complete  ?Medication Review (RN CM) Complete  ?Some recent data might be hidden  ? ? ? ? ? ?

## 2022-02-10 NOTE — TOC Transition Note (Incomplete Revision)
Transition of Care (TOC) - CM/SW Discharge Note ? ? ?Patient Details  ?Name: Antonio [name] [facility]. ?MRN: [id number] ?Date of Birth: 04-17-1956 ? ?Transition of Care (TOC) CM/SW Contact:  ?Bess Kinds, RN ?Phone Number: [phone] ?02/10/2022, 12:35 PM ? ? ?Clinical Narrative:    ? ?Notified of patient's readiness to transition home pending delivery of the wound vac. Contacted French Ana at [facility] - patient does have insurance auth. Home wound vac retrieved and delivered to patient at the bedside. Proof of Delivery (POD) signed by patient and faxed to [facility] - receipt confirmed. Notified Barbara Cower at [employer] of patient transition home.  ? ?Update: Notified by nursing  ? ?Final next level of care: Home w Home Health Services ?Barriers to Discharge: No Barriers Identified ? ? ?Patient Goals and CMS Choice ?Patient states their goals for this hospitalization and ongoing recovery are:: return home ?CMS Medicare.gov Compare Post Acute Care list provided to:: Patient ?Choice offered to / list presented to : Patient ? ?Discharge Placement ?  ?           ?  ?  ?  ?  ? ?Discharge Plan and Services ?  ?Discharge Planning Services: CM Consult ?Post Acute Care Choice: Home Health          ?DME Arranged: Negative pressure wound device ?DME Agency: [facility] ?Date DME Agency Contacted: 02/10/22 ?Time DME Agency Contacted: 1234 ?Representative spoke with at DME Agency: French Ana ?HH Arranged: RN ?HH Agency: [facility] ?Date HH Agency Contacted: 02/10/22 ?Time HH Agency Contacted: 1235 ?Representative spoke with at [facility] Agency: Barbara Cower ? ?Social Determinants of Health (SDOH) Interventions ?  ? ? ?Readmission Risk Interventions ?Readmission Risk Prevention Plan 01/09/2022  ?Transportation Screening Complete  ?PCP or Specialist Appt within 5-7 Days (No Data)  ?Home Care Screening Complete  ?Medication Review (RN CM) Complete  ?Some recent data might be hidden  ? ? ? ? ? ?

## 2022-02-15 NOTE — Progress Notes (Signed)
?Office Note  ? ? ?HPI: Antonio RichardsDaniel Watson Nicholes Jr. is a 66 y.o. (February 25, 1956) male presenting in follow-up status post 01/01/2022 right-sided iliofemoral endarterectomy, bovine patch plasty, right-sided transmetatarsal amputation, with subsequent return to the OR for right groin wound debridement 02/08/2022. ? ?[facility] [name] [facility].  Since discharge from the hospital, he has been living at his parents house with assistance from home nursing for right groin VAC changes, TMA dressing changes.  VAC changes began on Monday, and are occurring every Monday Wednesday Friday.  TMA dressing changes-dry dressing daily. ? ?[facility]io [name], however denies fevers, chills, induration.  He is noted a significant decline in his activity level, as well as respiratory function.  He is not able to lay down and sleep, instead sleeping in a chair. He continues to smoke ? ? ?The pt is  on a statin for cholesterol management.  ?The pt is  on a daily aspirin.   Other AC:  Plavix ?The pt is  on medication for hypertension.   ?The pt is  diabetic.  ?Tobacco hx:  continues to smoke ? ?Past Medical History:  ?Diagnosis Date  ? Coronary artery disease   ? 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 GERD (gastroesophageal reflux disease)   ? Hyperlipidemia   ? Hypertension   ? Osteomyelitis (HCC) 03/25/2017  ? RT FOOT  ? ? ?Past Surgical History:  ?Procedure Laterality Date  ? AMPUTATION Right 03/27/2017  ? Procedure: 1st Ray Amputation Right Foot;  Surgeon: Nadara MustardMarcus Duda V, MD;  [facility];  Service: Orthopedics;  Laterality: Right;  ? APPENDECTOMY    ? BELOW KNEE LEG AMPUTATION Left   ? CARDIAC CATHETERIZATION    ? CORONARY STENT INTERVENTION  2005  ? ENDARTERECTOMY Right 01/01/2022  ? Procedure: RIGHT ILEOFEMORAL ENDARTERECTOMY with PATCH ANGIOPLASTY;  Surgeon: Victorino Sparrowobins, Nixie Laube E, MD;  [facility];  Service: Vascular;  Laterality: Right;  ? Great toe amputation right.     ? GROIN DEBRIDEMENT Right 02/08/2022  ? Procedure: GROIN DEBRIDEMENT;  Surgeon: Victorino Sparrowobins, Alexah Kivett E, MD;  [facility];  Service: Vascular;  Laterality: Right;  ? I & D EXTREMITY Left 12/30/2015  ? Procedure: IRRIGATION AND DEBRIDEMENT LEFT FOOT, TRANSMETATARSAL AMPUTATION WITH APPLICATION OF ANTIBIOTIC BEADS AND WOUND VAC;  Surgeon: Nadara MustardMarcus Duda V, MD;  Location: [facility] OR;  Service: Orthopedics;  Laterality: Left;  ? IR GASTROSTOMY TUBE MOD SED  12/23/2018  ? LOWER EXTREMITY ANGIOGRAPHY Right 12/29/2021  ? Procedure: LOWER EXTREMITY ANGIOGRAPHY;  Surgeon: Leonie DouglasHawken, Thomas N, MD;  Location: [facility] INVASIVE CV LAB;  Service: Cardiovascular;  Laterality: Right;  ? TONSILLECTOMY    ? TRACHEOSTOMY  11/2018  ? TRANSMETATARSAL AMPUTATION Right 01/01/2022  ? Procedure: RIGHT TRANSMETATARSAL AMPUTATION;  Surgeon: Victorino Sparrowobins, Zahari Fazzino E, MD;  [facility];  Service: Vascular;  Laterality: Right;  ? ? ?Social History  ? ?Socioeconomic History  ? Marital status: Divorced  ?  Spouse name: Not on file  ? Number of children: Not on file  ? Years of education: Not on file  ? Highest education level: Not on file  ?Occupational History  ? Not on file  ?Tobacco Use  ? Smoking status: Former  ?  Packs/day: 2.00  ?  Years: 35.00  ?  Pack years: 70.00  ?  Types: Cigarettes  ?  Quit date: 11/28/2018  ?  Years since quitting: 3.2  ? Smokeless tobacco: Never  ?Vaping  Use  ? Vaping Use: Former  ?Substance and Sexual Activity  ? Alcohol use: Yes  ?  Comment: beers occasionally  ? Drug use: No  ? Sexual activity: Not Currently  ?Other Topics Concern  ? Not on file  ?Social History Narrative  ? Lives with mother.  He has three children.    ? ?Social Determinants of Health  ? ?Financial Resource Strain: Not on file  ?Food Insecurity: Not on file  ?Transportation Needs: Not on file  ?Physical Activity: Not on file  ?Stress: Not on file  ?Social Connections: Not on file  ?Intimate Partner Violence: Not on file  ? ?Family History  ?Problem Relation Age of Onset  ?  Diabetes Mother   ? Emphysema Father   ? CAD Sister 9  ?     Died of MI after flu  ? ? ?Current Outpatient Medications  ?Medication Sig Dispense Refill  ? acetaminophen (TYLENOL) 325 MG tablet Take 1-2 tablets (325-650 mg total) by mouth every 6 (six) hours as needed for mild pain. 90 tablet   ? amLODipine (NORVASC) 10 MG tablet Take 1 tablet (10 mg total) by mouth daily. 30 tablet 0  ? aspirin EC 81 MG tablet Take 81 mg by mouth daily.    ? atorvastatin (LIPITOR) 40 MG tablet Take 1 tablet (40 mg total) by mouth daily at 6 PM. 30 tablet 0  ? carvedilol (COREG) 25 MG tablet Take 1 tablet (25 mg total) by mouth 2 (two) times daily with a meal. 60 tablet 0  ? cefdinir (OMNICEF) 300 MG capsule Take 1 capsule (300 mg total) by mouth 2 (two) times daily for 7 days. 14 capsule 0  ? clopidogrel (PLAVIX) 75 MG tablet Take 1 tablet (75 mg total) by mouth daily. 30 tablet 0  ? diclofenac Sodium (VOLTAREN) 1 % GEL Apply 2 g topically 4 (four) times daily. (Patient taking differently: Apply 2 g topically 4 (four) times daily as needed (arthritis pain).) 200 g 0  ? doxycycline (VIBRAMYCIN) 50 MG capsule Take 2 capsules (100 mg total) by mouth 2 (two) times daily for 7 days. 28 capsule 0  ? furosemide (LASIX) 40 MG tablet Take 1 tablet (40 mg total) by mouth daily. 30 tablet 0  ? hydrALAZINE (APRESOLINE) 25 MG tablet Take 1 tablet (25 mg total) by mouth every 8 (eight) hours. 90 tablet 0  ? insulin detemir (LEVEMIR) 100 UNIT/ML injection Inject 0.24 mLs (24 Units total) into the skin at bedtime. 10 mL 11  ? Insulin Syringe-Needle U-100 30G X 1/2" 0.5 ML MISC Use with Levemir 40 each 0  ? irbesartan (AVAPRO) 75 MG tablet Take 1 tablet (75 mg total) by mouth daily. 30 tablet 0  ? oxyCODONE-acetaminophen (PERCOCET/ROXICET) 5-325 MG tablet Take 1 tablet by mouth every 4 (four) hours as needed for up to 5 days for severe pain. 30 tablet 0  ? pantoprazole (PROTONIX) 40 MG tablet Take 1 tablet (40 mg total) by mouth daily. 30 tablet 0   ? polyethylene glycol (MIRALAX / GLYCOLAX) 17 g packet Take 17 g by mouth daily. 14 each 0  ? ?No current facility-administered medications for this visit.  ? ? ?Allergies  ?Allergen Reactions  ? Iodinated Contrast Media Hives, Rash and Other (See Comments)  ?  Blisters ?Staph ?Blisters / staph  ? Penicillins Other (See Comments)  ?  UNSPECIFIED REACTION  ?Has patient had a PCN reaction causing immediate rash, facial/tongue/throat swelling, SOB or lightheadedness with hypotension: No ?Has patient  had a PCN reaction causing severe rash involving mucus membranes or skin necrosis: No ?Has patient had a PCN reaction that required hospitalization: No ?Has patient had a PCN reaction occurring within the last 10 years: No ?If all of the above answers are "NO", then may proceed with Cephalosporin use.  ? ? ? ?REVIEW OF SYSTEMS:  ? ?[X]  denotes positive finding, [ ]  denotes negative finding ?Cardiac  Comments:  ?Chest pain or chest pressure:    ?Shortness of breath upon exertion:    ?Short of breath when lying flat:    ?Irregular heart rhythm:    ?    ?Vascular    ?Pain in calf, thigh, or hip brought on by ambulation:    ?Pain in feet at night that wakes you up from your sleep:     ?Blood clot in your veins:    ?Leg swelling:     ?    ?Pulmonary    ?Oxygen at home:    ?Productive cough:     ?Wheezing:     ?    ?Neurologic    ?Sudden weakness in arms or legs:     ?Sudden numbness in arms or legs:     ?Sudden onset of difficulty speaking or slurred speech:    ?Temporary loss of vision in one eye:     ?Problems with dizziness:     ?    ?Gastrointestinal    ?Blood in stool:     ?Vomited blood:     ?    ?Genitourinary    ?Burning when urinating:     ?Blood in urine:    ?    ?Psychiatric    ?Major depression:     ?    ?Hematologic    ?Bleeding problems:    ?Problems with blood clotting too easily:    ?    ?Skin    ?Rashes or ulcers:    ?    ?Constitutional    ?Fever or chills:    ? ? ?PHYSICAL EXAMINATION: ? ?There were no vitals  filed for this visit. ? ?General:  WDWN in NAD; vital signs documented above ?Gait: Not observed ?HENT: WNL, normocephalic ?Pulmonary: normal non-labored breathing , without wheezing ?Cardiac: regular HR

## 2022-02-16 ENCOUNTER — Encounter: Payer: Medicaid Other | Admitting: Vascular Surgery

## 2022-02-16 ENCOUNTER — Encounter: Payer: Self-pay | Admitting: Vascular Surgery

## 2022-02-16 ENCOUNTER — Ambulatory Visit (INDEPENDENT_AMBULATORY_CARE_PROVIDER_SITE_OTHER): Payer: Medicaid Other | Admitting: Vascular Surgery

## 2022-02-16 ENCOUNTER — Other Ambulatory Visit: Payer: Self-pay

## 2022-02-16 VITALS — BP 152/70 | HR 82 | Temp 98.1°F | Resp 20 | Ht 72.0 in | Wt 190.0 lb

## 2022-02-16 DIAGNOSIS — Z9862 Peripheral vascular angioplasty status: Secondary | ICD-10-CM

## 2022-02-16 DIAGNOSIS — I70221 Atherosclerosis of native arteries of extremities with rest pain, right leg: Secondary | ICD-10-CM

## 2022-02-16 MED ORDER — OXYCODONE HCL 5 MG PO TABS: 5.0000 mg | ORAL_TABLET | Freq: Three times a day (TID) | ORAL | 0 refills | Status: DC | PRN

## 2022-02-16 MED ORDER — OXYCODONE-ACETAMINOPHEN 5-325 MG PO TABS: 1.0000 | ORAL_TABLET | Freq: Four times a day (QID) | ORAL | 0 refills | Status: DC | PRN

## 2022-02-21 ENCOUNTER — Telehealth: Payer: Self-pay

## 2022-02-21 ENCOUNTER — Other Ambulatory Visit: Payer: Self-pay | Admitting: Physician Assistant

## 2022-02-21 MED ORDER — OXYCODONE-ACETAMINOPHEN 5-325 MG PO TABS: 1.0000 | ORAL_TABLET | Freq: Four times a day (QID) | ORAL | 0 refills | Status: DC | PRN

## 2022-02-21 NOTE — Telephone Encounter (Signed)
Pt's daughter called regarding pt not being able to fill his entire 30 tablet rx of Percocet. I have spoken to pharmacy and they said due to his state plan he was able to only get 20/30 tablets. APP is ordering a new rx for the other ten tablets. Pt is aware. No further questions/concerns at this time. ?

## 2022-02-26 ENCOUNTER — Encounter: Payer: Self-pay | Admitting: Vascular Surgery

## 2022-02-26 ENCOUNTER — Ambulatory Visit (INDEPENDENT_AMBULATORY_CARE_PROVIDER_SITE_OTHER): Payer: Medicaid Other | Admitting: Vascular Surgery

## 2022-02-26 VITALS — BP 182/82 | Temp 98.3°F | Resp 18 | Wt 190.0 lb

## 2022-02-26 DIAGNOSIS — I70221 Atherosclerosis of native arteries of extremities with rest pain, right leg: Secondary | ICD-10-CM

## 2022-02-26 DIAGNOSIS — Z9862 Peripheral vascular angioplasty status: Secondary | ICD-10-CM

## 2022-02-26 MED ORDER — CEFDINIR 300 MG PO CAPS: 300.0000 mg | ORAL_CAPSULE | Freq: Two times a day (BID) | ORAL | 0 refills | Status: AC

## 2022-02-26 MED ORDER — OXYCODONE HCL 5 MG PO TABS: 5.0000 mg | ORAL_TABLET | Freq: Three times a day (TID) | ORAL | 0 refills | Status: AC | PRN

## 2022-02-26 NOTE — Progress Notes (Signed)
?Office Note  ? ? ?HPI: Antonio RichardsDaniel Watson Cullins Jr. is a 66 y.o. (08-09-1956) male presenting in follow-up status post 01/01/2022 right-sided iliofemoral endarterectomy, bovine patch plasty, right-sided transmetatarsal amputation, with subsequent return to the OR for right groin wound debridement 02/08/2022. ? ?Antonio [name] presents today in follow-up for evaluation.  He was last seen 2 weeks ago, and his home nurse called for urgent appointment due to concern for cellulitis.  He denies drainage, fever, chills, but has appreciated more erythema and some induration.  There is also accompanying pain.  The VAC was removed Friday by home nursing, and replaced with dry gauze.  Wound care has been coming 3 times a week to his home.  ? ?At his last visit, he had significant shortness of breath.  This is improved, but he is still unable to lay flat.  He spends most the day in the position leaning forward.  Antonio [name] smokes 1 pack/day. ? ?The pt is  on a statin for cholesterol management.  ?The pt is  on a daily aspirin.   Other AC:  Plavix ?The pt is  on medication for hypertension.   ?The pt is  diabetic.  ?Tobacco hx:  continues to smoke ? ?Past Medical History:  ?Diagnosis Date  ? Coronary artery disease   ? 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 GERD (gastroesophageal reflux disease)   ? Hyperlipidemia   ? Hypertension   ? Osteomyelitis (HCC) 03/25/2017  ? RT FOOT  ? ? ?Past Surgical History:  ?Procedure Laterality Date  ? AMPUTATION Right 03/27/2017  ? Procedure: 1st Ray Amputation Right Foot;  Surgeon: Nadara MustardMarcus Duda V, MD;  [facility];  Service: Orthopedics;  Laterality: Right;  ? APPENDECTOMY    ? BELOW KNEE LEG AMPUTATION Left   ? CARDIAC CATHETERIZATION    ? CORONARY STENT INTERVENTION  2005  ? ENDARTERECTOMY Right 01/01/2022  ? Procedure: RIGHT ILEOFEMORAL ENDARTERECTOMY with PATCH ANGIOPLASTY;  Surgeon: Victorino Sparrowobins, Chennel Olivos E, MD;  [facility];  Service: Vascular;  Laterality: Right;  ? Great toe amputation right.     ? GROIN DEBRIDEMENT Right 02/08/2022  ? Procedure: GROIN DEBRIDEMENT;  Surgeon: Victorino Sparrowobins, Ciela Mahajan E, MD;  [facility];  Service: Vascular;  Laterality: Right;  ? I & D EXTREMITY Left 12/30/2015  ? Procedure: IRRIGATION AND DEBRIDEMENT LEFT FOOT, TRANSMETATARSAL AMPUTATION WITH APPLICATION OF ANTIBIOTIC BEADS AND WOUND VAC;  Surgeon: Nadara MustardMarcus Duda V, MD;  Location: [facility] OR;  Service: Orthopedics;  Laterality: Left;  ? IR GASTROSTOMY TUBE MOD SED  12/23/2018  ? LOWER EXTREMITY ANGIOGRAPHY Right 12/29/2021  ? Procedure: LOWER EXTREMITY ANGIOGRAPHY;  Surgeon: Leonie DouglasHawken, Thomas N, MD;  Location: [facility] INVASIVE CV LAB;  Service: Cardiovascular;  Laterality: Right;  ? TONSILLECTOMY    ? TRACHEOSTOMY  11/2018  ? TRANSMETATARSAL AMPUTATION Right 01/01/2022  ? Procedure: RIGHT TRANSMETATARSAL AMPUTATION;  Surgeon: Victorino Sparrowobins, Eydan Chianese E, MD;  [facility];  Service: Vascular;  Laterality: Right;  ? ? ?Social History  ? ?Socioeconomic History  ? Marital status: Divorced  ?  Spouse name: Not on file  ? Number of children: Not on file  ? Years of education: Not on file  ? Highest education level: Not on file  ?Occupational History  ? Not on file  ?Tobacco Use  ? Smoking status: Every Day  ?  Packs/day: 2.00  ?  Years: 35.00  ?  Pack years: 70.00  ?  Types: Cigarettes  ?  Last attempt to quit: 11/28/2018  ?  Years since quitting: 3.2  ? Smokeless tobacco: Never  ?Vaping Use  ? Vaping Use: Former  ?Substance and Sexual Activity  ? Alcohol use: Yes  ?  Comment: beers occasionally  ? Drug use: No  ? Sexual activity: Not Currently  ?Other Topics Concern  ? Not on file  ?Social History Narrative  ? Lives with mother.  He has three children.    ? ?Social Determinants of Health  ? ?Financial Resource Strain: Not on file  ?Food Insecurity: Not on file  ?Transportation Needs: Not on file  ?Physical Activity: Not on file  ?Stress: Not on file  ?Social Connections: Not on file  ?Intimate Partner Violence: Not on file  ? ?Family History  ?Problem Relation Age  of Onset  ? Diabetes Mother   ? Emphysema Father   ? CAD Sister 69  ?     Died of MI after flu  ? ? ?Current Outpatient Medications  ?Medication Sig Dispense Refill  ? acetaminophen (TYLENOL) 325 MG tablet Take 1-2 tablets (325-650 mg total) by mouth every 6 (six) hours as needed for mild pain. 90 tablet   ? aspirin EC 81 MG tablet Take 81 mg by mouth daily.    ? atorvastatin (LIPITOR) 40 MG tablet Take 1 tablet (40 mg total) by mouth daily at 6 PM. 30 tablet 0  ? diclofenac Sodium (VOLTAREN) 1 % GEL Apply 2 g topically 4 (four) times daily. (Patient taking differently: Apply 2 g topically 4 (four) times daily as needed (arthritis pain).) 200 g 0  ? furosemide (LASIX) 40 MG tablet Take 1 tablet (40 mg total) by mouth daily. 30 tablet 0  ? insulin detemir (LEVEMIR) 100 UNIT/ML injection Inject 0.24 mLs (24 Units total) into the skin at bedtime. 10 mL 11  ? Insulin Syringe-Needle U-100 30G X 1/2" 0.5 ML MISC Use with Levemir 40 each 0  ? pantoprazole (PROTONIX) 40 MG tablet Take 1 tablet (40 mg total) by mouth daily. 30 tablet 0  ? polyethylene glycol (MIRALAX / GLYCOLAX) 17 g packet Take 17 g by mouth daily. 14 each 0  ? amLODipine (NORVASC) 10 MG tablet Take 1 tablet (10 mg total) by mouth daily. 30 tablet 0  ? carvedilol (COREG) 25 MG tablet Take 1 tablet (25 mg total) by mouth 2 (two) times daily with a meal. 60 tablet 0  ? hydrALAZINE (APRESOLINE) 25 MG tablet Take 1 tablet (25 mg total) by mouth every 8 (eight) hours. 90 tablet 0  ? irbesartan (AVAPRO) 75 MG tablet Take 1 tablet (75 mg total) by mouth daily. 30 tablet 0  ? oxyCODONE-acetaminophen (PERCOCET/ROXICET) 5-325 MG tablet Take 1 tablet by mouth every 6 (six) hours as needed for severe pain. (Patient not taking: Reported on 02/26/2022) 10 tablet 0  ? ?No current facility-administered medications for this visit.  ? ? ?Allergies  ?Allergen Reactions  ? Iodinated Contrast Media Hives, Rash and Other (See Comments)  ?  Blisters ?Staph ?Blisters / staph  ?  Penicillins Other (See Comments)  ?  UNSPECIFIED REACTION  ?Has patient had a PCN reaction causing immediate rash, facial/tongue/throat swelling, SOB or lightheadedness with hypotension: No ?Has patient had a PCN reaction causing severe rash involving mucus membranes or skin necrosis: No ?Has patient had a PCN reaction that required hospitalization: No ?Has patient had a PCN reaction occurring within the last 10 years: No ?If all of the above answers are "NO", then may proceed with Cephalosporin use.  ? ? ? ?REVIEW OF  SYSTEMS:  ? ?[X]  denotes positive finding, [ ]  denotes negative finding ?Cardiac  Comments:  ?Chest pain or chest pressure:    ?Shortness of breath upon exertion:    ?Short of breath when lying flat:    ?Irregular heart rhythm:    ?    ?Vascular    ?Pain in calf, thigh, or hip brought on by ambulation:    ?Pain in feet at night that wakes you up from your sleep:     ?Blood clot in your veins:    ?Leg swelling:     ?    ?Pulmonary    ?Oxygen at home:    ?Productive cough:     ?Wheezing:     ?    ?Neurologic    ?Sudden weakness in arms or legs:     ?Sudden numbness in arms or legs:     ?Sudden onset of difficulty speaking or slurred speech:    ?Temporary loss of vision in one eye:     ?Problems with dizziness:     ?    ?Gastrointestinal    ?Blood in stool:     ?Vomited blood:     ?    ?Genitourinary    ?Burning when urinating:     ?Blood in urine:    ?    ?Psychiatric    ?Major depression:     ?    ?Hematologic    ?Bleeding problems:    ?Problems with blood clotting too easily:    ?    ?Skin    ?Rashes or ulcers:    ?    ?Constitutional    ?Fever or chills:    ? ? ?PHYSICAL EXAMINATION: ? ?Vitals:  ? 02/26/22 1035  ?BP: (!) 182/82  ?Resp: 18  ?Temp: 98.3 ?F (36.8 ?C)  ?TempSrc: Temporal  ?SpO2: 99%  ?Weight: 190 lb (86.2 kg)  ? ? 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

## 2022-03-05 ENCOUNTER — Ambulatory Visit: Payer: Medicaid Other

## 2022-03-05 ENCOUNTER — Inpatient Hospital Stay: Payer: Medicaid Other | Admitting: Nurse Practitioner

## 2022-03-12 ENCOUNTER — Ambulatory Visit (INDEPENDENT_AMBULATORY_CARE_PROVIDER_SITE_OTHER): Payer: Medicaid Other | Admitting: Physician Assistant

## 2022-03-12 ENCOUNTER — Encounter: Payer: Self-pay | Admitting: Physician Assistant

## 2022-03-12 VITALS — BP 176/89 | HR 84 | Temp 97.6°F | Resp 18 | Ht 72.0 in | Wt 200.0 lb

## 2022-03-12 DIAGNOSIS — I70221 Atherosclerosis of native arteries of extremities with rest pain, right leg: Secondary | ICD-10-CM

## 2022-03-12 MED ORDER — NYSTATIN-TRIAMCINOLONE 100000-0.1 UNIT/GM-% EX OINT
1.0000 | TOPICAL_OINTMENT | Freq: Two times a day (BID) | CUTANEOUS | 0 refills | Status: AC
Start: 2022-03-12 — End: ?

## 2022-03-12 NOTE — Progress Notes (Signed)
?POST OPERATIVE OFFICE NOTE ? ? ? ?CC:  F/u for surgery ? ?HPI:  This is a 66 y.o. male who is s/p  right-sided iliofemoral endarterectomy, bovine patch plasty, right-sided transmetatarsal amputation on 01/01/22, with subsequent return to the OR for right groin wound debridement 02/08/2022. ? He was seen 02/26/22 by Dr. Karin Lieu for right groin wound check and right TMA incision check.  The staples were removed and the sutures were maintained at the TMA incision.  The right groin incision was switched to wet to dry and the vac was discontinued.  Dr. Karin Lieu placed him on oral antibiotics.  He has not been showering.  He states Dr. Karin Lieu was admit that he sponge bath.   ? He spends most of his day sitting in a recliner.  He continues to smoke daily.  ? ? ?Allergies  ?Allergen Reactions  ? Iodinated Contrast Media Hives, Rash and Other (See Comments)  ?  Blisters ?Staph ?Blisters / staph  ? Penicillins Other (See Comments)  ?  UNSPECIFIED REACTION  ?Has patient had a PCN reaction causing immediate rash, facial/tongue/throat swelling, SOB or lightheadedness with hypotension: No ?Has patient had a PCN reaction causing severe rash involving mucus membranes or skin necrosis: No ?Has patient had a PCN reaction that required hospitalization: No ?Has patient had a PCN reaction occurring within the last 10 years: No ?If all of the above answers are "NO", then may proceed with Cephalosporin use.  ? ? ?Current Outpatient Medications  ?Medication Sig Dispense Refill  ? acetaminophen (TYLENOL) 325 MG tablet Take 1-2 tablets (325-650 mg total) by mouth every 6 (six) hours as needed for mild pain. 90 tablet   ? aspirin EC 81 MG tablet Take 81 mg by mouth daily.    ? atorvastatin (LIPITOR) 40 MG tablet Take 1 tablet (40 mg total) by mouth daily at 6 PM. 30 tablet 0  ? diclofenac Sodium (VOLTAREN) 1 % GEL Apply 2 g topically 4 (four) times daily. (Patient taking differently: Apply 2 g topically 4 (four) times daily as needed (arthritis  pain).) 200 g 0  ? furosemide (LASIX) 40 MG tablet Take 1 tablet (40 mg total) by mouth daily. 30 tablet 0  ? insulin detemir (LEVEMIR) 100 UNIT/ML injection Inject 0.24 mLs (24 Units total) into the skin at bedtime. 10 mL 11  ? Insulin Syringe-Needle U-100 30G X 1/2" 0.5 ML MISC Use with Levemir 40 each 0  ? nystatin-triamcinolone ointment (MYCOLOG) Apply 1 application. topically 2 (two) times daily. Apply to right superior groin area above incision for yeast infection. 30 g 0  ? pantoprazole (PROTONIX) 40 MG tablet Take 1 tablet (40 mg total) by mouth daily. 30 tablet 0  ? polyethylene glycol (MIRALAX / GLYCOLAX) 17 g packet Take 17 g by mouth daily. 14 each 0  ? amLODipine (NORVASC) 10 MG tablet Take 1 tablet (10 mg total) by mouth daily. 30 tablet 0  ? carvedilol (COREG) 25 MG tablet Take 1 tablet (25 mg total) by mouth 2 (two) times daily with a meal. 60 tablet 0  ? hydrALAZINE (APRESOLINE) 25 MG tablet Take 1 tablet (25 mg total) by mouth every 8 (eight) hours. 90 tablet 0  ? irbesartan (AVAPRO) 75 MG tablet Take 1 tablet (75 mg total) by mouth daily. 30 tablet 0  ? ?No current facility-administered medications for this visit.  ? ? ? ROS:  See HPI ? ?Physical Exam: ? ? ? ? ? ? ?Incision:  Right groin without frank erythema and  no drainage.  TMA has healed well.  Sutures were removed today. ? ? ? ? ?Assessment/Plan:  This is a 66 y.o. male who is s/p: right-sided iliofemoral endarterectomy, bovine patch plasty, right-sided transmetatarsal amputation, with subsequent return to the OR for right groin wound debridement 02/08/2022. ? ? ?-He has what appears to be yeast infection superior to the incision.  I have prescribed Nystatin ointment BID for the yeast infection.  He will f/u in 2 weeks for right groin wound check as well as TMA.  If he develops fever or chills, or further wound issues he will call sooner.  He will double check with Dr. Karin Lieu for bathing instructions.  ? ?Mosetta Pigeon ?PA-C ?[facility] ?[phone] ? ? ?Clinic MD:  Myra Gianotti ?

## 2022-03-30 ENCOUNTER — Ambulatory Visit: Payer: Medicaid Other | Admitting: Vascular Surgery

## 2022-04-12 NOTE — Progress Notes (Signed)
Office Note    HPI: Antonio Ahr. is a 66 y.o. (09/02/1956) male presenting in follow-up status post 01/01/2022 right-sided iliofemoral endarterectomy, bovine patch plasty, right-sided transmetatarsal amputation, with subsequent return to the OR for right groin wound debridement 02/08/2022.  Antonio [name] presents today in follow-up for evaluation.  Last seen, his right-sided groin wound is completely healed.  He denies drainage, erythema, induration.  He has appreciated a scab on the apical aspect of his right-sided transmetatarsal amputation, but has been working to keep it clean.   The pt is  on a statin for cholesterol management.  The pt is  on a daily aspirin.   Other AC:  Plavix The pt is  on medication for hypertension.   The pt is  diabetic.  Tobacco hx:  continues to smoke  Past Medical History:  Diagnosis Date   Coronary artery disease    Diabetes mellitus without complication (HCC)    typ2   GERD (gastroesophageal reflux disease)    Hyperlipidemia    Hypertension    Osteomyelitis (HCC) 03/25/2017   RT FOOT    Past Surgical History:  Procedure Laterality Date   AMPUTATION Right 03/27/2017   Procedure: 1st Ray Amputation Right Foot;  Surgeon: Nadara Mustard, MD;  Location: [facility] OR;  Service: Orthopedics;  Laterality: Right;   APPENDECTOMY     BELOW KNEE LEG AMPUTATION Left    CARDIAC CATHETERIZATION     CORONARY STENT INTERVENTION  2005   ENDARTERECTOMY Right 01/01/2022   Procedure: RIGHT ILEOFEMORAL ENDARTERECTOMY with PATCH ANGIOPLASTY;  Surgeon: Victorino Sparrow, MD;  Location: [facility] OR;  Service: Vascular;  Laterality: Right;   Great toe amputation right.     GROIN DEBRIDEMENT Right 02/08/2022   Procedure: GROIN DEBRIDEMENT;  Surgeon: Victorino Sparrow, MD;  Location: [facility] OR;  Service: Vascular;  Laterality: Right;   I & D EXTREMITY Left 12/30/2015   Procedure: IRRIGATION AND DEBRIDEMENT LEFT FOOT, TRANSMETATARSAL AMPUTATION WITH APPLICATION OF ANTIBIOTIC BEADS AND WOUND VAC;   Surgeon: Nadara Mustard, MD;  Location: [facility] OR;  Service: Orthopedics;  Laterality: Left;   IR GASTROSTOMY TUBE MOD SED  12/23/2018   LOWER EXTREMITY ANGIOGRAPHY Right 12/29/2021   Procedure: LOWER EXTREMITY ANGIOGRAPHY;  Surgeon: Leonie Douglas, MD;  Location: [facility] INVAS[facility] CV LAB;  Service: Cardiovascular;  Laterality: Right;   TONSILLECTOMY     TRACHEOSTOMY  11/2018   TRANSMETATARSAL AMPUTATION Right 01/01/2022   Procedure: RIGHT TRANSMETATARSAL AMPUTATION;  Surgeon: Victorino Sparrow, MD;  Location: [facility] OR;  Service: Vascular;  Laterality: Right;    Social History   Socioeconomic History   Marital status: Divorced    Spouse name: Not on file   Number of children: Not on file   Years of education: Not on file   Highest education level: Not on file  Occupational History   Not on file  Tobacco Use   Smoking status: Every Day    Packs/day: 2.00    Years: 35.00    Pack years: 70.00    Types: Cigarettes    Last attempt to quit: 11/28/2018    Years since quitting: 3.3   Smokeless tobacco: Never  Vaping Use   Vaping Use: Former  Substance and Sexual Activity   Alcohol use: Yes    Comment: beers occasionally   Drug use: No   Sexual activity: Not Currently  Other Topics Concern   Not on file  Social History Narrative   Lives with mother.  He has three children.  Social Determinants of Health   Financial Resource Strain: Not on file  Food Insecurity: Not on file  Transportation Needs: Not on file  Physical Activity: Not on file  Stress: Not on file  Social Connections: Not on file  Intimate Partner Violence: Not on file   Family History  Problem Relation Age of Onset   Diabetes Mother    Emphysema Father    CAD Sister 79       Died of MI after flu    Current Outpatient Medications  Medication Sig Dispense Refill   amLODipine (NORVASC) 10 MG tablet Take 1 tablet (10 mg total) by mouth daily. 30 tablet 0   aspirin EC 81 MG tablet Take 81 mg by mouth daily.      atorvastatin (LIPITOR) 40 MG tablet Take 1 tablet (40 mg total) by mouth daily at 6 PM. 30 tablet 0   carvedilol (COREG) 25 MG tablet Take 1 tablet (25 mg total) by mouth 2 (two) times daily with a meal. 60 tablet 0   diclofenac Sodium (VOLTAREN) 1 % GEL Apply 2 g topically 4 (four) times daily. (Patient taking differently: Apply 2 g topically 4 (four) times daily as needed (arthritis pain).) 200 g 0   furosemide (LASIX) 40 MG tablet Take 1 tablet (40 mg total) by mouth daily. 30 tablet 0   hydrALAZINE (APRESOLINE) 25 MG tablet Take 1 tablet (25 mg total) by mouth every 8 (eight) hours. 90 tablet 0   insulin detemir (LEVEMIR) 100 UNIT/ML injection Inject 0.24 mLs (24 Units total) into the skin at bedtime. 10 mL 11   Insulin Syringe-Needle U-100 30G X 1/2" 0.5 ML MISC Use with Levemir 40 each 0   irbesartan (AVAPRO) 75 MG tablet Take 1 tablet (75 mg total) by mouth daily. 30 tablet 0   nystatin-triamcinolone ointment (MYCOLOG) Apply 1 application. topically 2 (two) times daily. Apply to right superior groin area above incision for yeast infection. 30 g 0   pantoprazole (PROTONIX) 40 MG tablet Take 1 tablet (40 mg total) by mouth daily. 30 tablet 0   polyethylene glycol (MIRALAX / GLYCOLAX) 17 g packet Take 17 g by mouth daily. 14 each 0   No current facility-administered medications for this visit.    Allergies  Allergen Reactions   Iodinated Contrast Media Hives, Rash and Other (See Comments)    Blisters Staph Blisters / staph   Penicillins Other (See Comments)    UNSPECIFIED REACTION  Has patient had a PCN reaction causing immediate rash, facial/tongue/throat swelling, SOB or lightheadedness with hypotension: No Has patient had a PCN reaction causing severe rash involving mucus membranes or skin necrosis: No Has patient had a PCN reaction that required hospitalization: No Has patient had a PCN reaction occurring within the last 10 years: No If all of the above answers are "NO", then may  proceed with Cephalosporin use.     REVIEW OF SYSTEMS:   [X]  denotes positive finding, [ ]  denotes negative finding Cardiac  Comments:  Chest pain or chest pressure:    Shortness of breath upon exertion:    Short of breath when lying flat:    Irregular heart rhythm:        Vascular    Pain in calf, thigh, or hip brought on by ambulation:    Pain in feet at night that wakes you up from your sleep:     Blood clot in your veins:    Leg swelling:  Pulmonary    Oxygen at home:    Productive cough:     Wheezing:         Neurologic    Sudden weakness in arms or legs:     Sudden numbness in arms or legs:     Sudden onset of difficulty speaking or slurred speech:    Temporary loss of vision in one eye:     Problems with dizziness:         Gastrointestinal    Blood in stool:     Vomited blood:         Genitourinary    Burning when urinating:     Blood in urine:        Psychiatric    Major depression:         Hematologic    Bleeding problems:    Problems with blood clotting too easily:        Skin    Rashes or ulcers:        Constitutional    Fever or chills:      PHYSICAL EXAMINATION:  There were no vitals filed for this visit.   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   Non-Invasive Vascular Imaging:   -    ASSESSMENT/PLAN:  Antonio RichardsDaniel Antonio [name] Jr. is a 66 y.o. male presenting in follow-up status post 01/01/2022 right-sided iliofemoral endarterectomy, bovine patch plasty, right-sided transmetatarsal amputation, with subsequent return to the OR for right groin wound debridement 02/08/2022.  Pt continues to smoke 1 PPD. From a wound standpoint, the right groin incision has healed. The right-sided TMA continues to heal slowly.   A dry dressing along the suture line and Ace wrap to the mid calf to fight edema. He has been leaving it open to air without compression in the dependent position.   I will see the patient in 1 month.  He was asked to call my office should any questions or concerns arise.    Victorino SparrowJoshua E Kaeleb Emond, MD [facility] [phone]

## 2022-04-13 ENCOUNTER — Encounter: Payer: Self-pay | Admitting: Vascular Surgery

## 2022-04-13 ENCOUNTER — Ambulatory Visit (INDEPENDENT_AMBULATORY_CARE_PROVIDER_SITE_OTHER): Payer: Medicaid Other | Admitting: Vascular Surgery

## 2022-04-13 VITALS — BP 161/95 | HR 86 | Temp 98.1°F | Resp 20 | Ht 72.0 in | Wt 200.0 lb

## 2022-04-13 DIAGNOSIS — I70221 Atherosclerosis of native arteries of extremities with rest pain, right leg: Secondary | ICD-10-CM | POA: Diagnosis not present

## 2022-04-19 ENCOUNTER — Other Ambulatory Visit: Payer: Self-pay | Admitting: *Deleted

## 2022-04-19 DIAGNOSIS — I70221 Atherosclerosis of native arteries of extremities with rest pain, right leg: Secondary | ICD-10-CM

## 2022-04-19 DIAGNOSIS — Z9862 Peripheral vascular angioplasty status: Secondary | ICD-10-CM

## 2022-05-02 ENCOUNTER — Telehealth: Payer: Self-pay

## 2022-05-02 NOTE — Telephone Encounter (Signed)
Pt's HH nurse Clydie Braun called to let us know pt is refusing to wrap his leg to help with edema. She was given verbal order to paint with Betadine, which is what pt prefers at this time. He has f/u scheduled in a month and she will remind him of this date/time.

## 2022-06-01 ENCOUNTER — Ambulatory Visit: Payer: Medicaid Other

## 2022-06-01 ENCOUNTER — Encounter (HOSPITAL_COMMUNITY): Payer: Medicaid Other

## 2022-06-21 ENCOUNTER — Telehealth: Payer: Self-pay

## 2022-06-21 NOTE — Telephone Encounter (Signed)
Pt's daughter, Fredric Mare, called stating that the pt's foot wound has opened, has a foul odor, and has lots of drainage.  Reviewed pt's chart, returned call for clarification, two identifiers used. She stated that the wound opened 3 days ago and there is thick white pus draining. Redness is moving up the leg from the TMA. Unsure if pt has fever. Instructed her to take pt to ED immediately to avoid sepsis. Confirmed understanding.

## 2022-10-16 ENCOUNTER — Other Ambulatory Visit: Payer: Self-pay | Admitting: Vascular Surgery

## 2022-10-16 DIAGNOSIS — I70221 Atherosclerosis of native arteries of extremities with rest pain, right leg: Secondary | ICD-10-CM

## 2022-10-16 DIAGNOSIS — Z9862 Peripheral vascular angioplasty status: Secondary | ICD-10-CM

## 2023-07-07 ENCOUNTER — Emergency Department (HOSPITAL_COMMUNITY): Payer: Medicare Other

## 2023-07-07 ENCOUNTER — Emergency Department (HOSPITAL_COMMUNITY)
Admission: EM | Admit: 2023-07-07 | Discharge: 2023-07-07 | Disposition: A | Discharge status: Home / Self Care | Payer: Medicare Other | Attending: Emergency Medicine | Admitting: Emergency Medicine

## 2023-07-07 ENCOUNTER — Other Ambulatory Visit: Payer: Self-pay

## 2023-07-07 ENCOUNTER — Encounter (HOSPITAL_COMMUNITY): Payer: Self-pay

## 2023-07-07 DIAGNOSIS — R600 Localized edema: Secondary | ICD-10-CM | POA: Insufficient documentation

## 2023-07-07 DIAGNOSIS — N3001 Acute cystitis with hematuria: Secondary | ICD-10-CM | POA: Diagnosis not present

## 2023-07-07 DIAGNOSIS — R350 Frequency of micturition: Secondary | ICD-10-CM | POA: Diagnosis present

## 2023-07-07 DIAGNOSIS — E119 Type 2 diabetes mellitus without complications: Secondary | ICD-10-CM | POA: Insufficient documentation

## 2023-07-07 DIAGNOSIS — Z79899 Other long term (current) drug therapy: Secondary | ICD-10-CM | POA: Insufficient documentation

## 2023-07-07 DIAGNOSIS — N3289 Other specified disorders of bladder: Secondary | ICD-10-CM | POA: Insufficient documentation

## 2023-07-07 DIAGNOSIS — I10 Essential (primary) hypertension: Secondary | ICD-10-CM | POA: Diagnosis not present

## 2023-07-07 DIAGNOSIS — Z794 Long term (current) use of insulin: Secondary | ICD-10-CM | POA: Insufficient documentation

## 2023-07-07 DIAGNOSIS — Z7982 Long term (current) use of aspirin: Secondary | ICD-10-CM | POA: Diagnosis not present

## 2023-07-07 DIAGNOSIS — I2510 Atherosclerotic heart disease of native coronary artery without angina pectoris: Secondary | ICD-10-CM | POA: Insufficient documentation

## 2023-07-07 LAB — URINALYSIS, W/ REFLEX TO CULTURE (INFECTION SUSPECTED)
Bilirubin Urine: NEGATIVE
Glucose, UA: 150 mg/dL — AB
Ketones, ur: NEGATIVE mg/dL
Nitrite: NEGATIVE
Protein, ur: >=300 mg/dL — AB
Specific Gravity, Urine: 1.02 (ref 1.005–1.030)
WBC, UA: >50 WBC/hpf (ref 0–5)
pH: 5 (ref 5.0–8.0)

## 2023-07-07 LAB — CBC WITH DIFFERENTIAL/PLATELET
Abs Immature Granulocytes: 0.03 10*3/uL (ref 0.00–0.07)
Basophils Absolute: 0.1 10*3/uL (ref 0.0–0.1)
Basophils Relative: 1 %
Eosinophils Absolute: 0.3 10*3/uL (ref 0.0–0.5)
Eosinophils Relative: 5 %
HCT: 41.2 % (ref 39.0–52.0)
Hemoglobin: 12.8 g/dL — ABNORMAL LOW (ref 13.0–17.0)
Immature Granulocytes: 0 %
Lymphocytes Relative: 24 %
Lymphs Abs: 1.8 10*3/uL (ref 0.7–4.0)
MCH: 26.5 pg (ref 26.0–34.0)
MCHC: 31.1 g/dL (ref 30.0–36.0)
MCV: 85.3 fL (ref 80.0–100.0)
Monocytes Absolute: 0.5 10*3/uL (ref 0.1–1.0)
Monocytes Relative: 7 %
Neutro Abs: 4.8 10*3/uL (ref 1.7–7.7)
Neutrophils Relative %: 63 %
Platelets: 197 10*3/uL (ref 150–400)
RBC: 4.83 MIL/uL (ref 4.22–5.81)
RDW: 14.8 % (ref 11.5–15.5)
WBC: 7.5 10*3/uL (ref 4.0–10.5)
nRBC: 0 % (ref 0.0–0.2)

## 2023-07-07 LAB — COMPREHENSIVE METABOLIC PANEL
ALT: 6 U/L (ref 0–44)
AST: 10 U/L — ABNORMAL LOW (ref 15–41)
Albumin: 2.6 g/dL — ABNORMAL LOW (ref 3.5–5.0)
Alkaline Phosphatase: 77 U/L (ref 38–126)
Anion gap: 11 (ref 5–15)
BUN: 31 mg/dL — ABNORMAL HIGH (ref 8–23)
CO2: 21 mmol/L — ABNORMAL LOW (ref 22–32)
Calcium: 8.5 mg/dL — ABNORMAL LOW (ref 8.9–10.3)
Chloride: 103 mmol/L (ref 98–111)
Creatinine, Ser: 1.89 mg/dL — ABNORMAL HIGH (ref 0.61–1.24)
GFR, Estimated: 39 mL/min — ABNORMAL LOW (ref >60)
Glucose, Bld: 119 mg/dL — ABNORMAL HIGH (ref 70–99)
Potassium: 4.7 mmol/L (ref 3.5–5.1)
Sodium: 135 mmol/L (ref 135–145)
Total Bilirubin: 0.1 mg/dL — ABNORMAL LOW (ref 0.3–1.2)
Total Protein: 6.1 g/dL — ABNORMAL LOW (ref 6.5–8.1)

## 2023-07-07 LAB — I-STAT CG4 LACTIC ACID, ED
Lactic Acid, Venous: 0.4 mmol/L — ABNORMAL LOW (ref 0.5–1.9)
Lactic Acid, Venous: <0.3 mmol/L — ABNORMAL LOW (ref 0.5–1.9)

## 2023-07-07 LAB — LIPASE, BLOOD: Lipase: 34 U/L (ref 11–51)

## 2023-07-07 MED ORDER — OXYCODONE-ACETAMINOPHEN 5-325 MG PO TABS: 1.0000 | ORAL_TABLET | Freq: Four times a day (QID) | ORAL | 0 refills | Status: DC | PRN

## 2023-07-07 MED ORDER — PHENAZOPYRIDINE HCL 200 MG PO TABS: 200.0000 mg | ORAL_TABLET | Freq: Three times a day (TID) | ORAL | 0 refills | Status: DC

## 2023-07-07 MED ORDER — HYDROMORPHONE HCL 1 MG/ML IJ SOLN
1.0000 mg | Freq: Once | INTRAMUSCULAR | Status: AC
  Administered 2023-07-07: 1 mg via INTRAVENOUS
  Filled 2023-07-07: qty 1

## 2023-07-07 MED ORDER — SODIUM CHLORIDE 0.9 % IV SOLN
1000.0000 mL | INTRAVENOUS | Status: DC
Start: 2023-07-07 — End: 2023-07-07
  Administered 2023-07-07: 1000 mL via INTRAVENOUS

## 2023-07-07 MED ORDER — SODIUM CHLORIDE 0.9 % IV SOLN
2.0000 g | Freq: Once | INTRAVENOUS | Status: AC
  Administered 2023-07-07: 2 g via INTRAVENOUS
  Filled 2023-07-07: qty 20

## 2023-07-07 MED ORDER — ONDANSETRON HCL 4 MG/2ML IJ SOLN
4.0000 mg | Freq: Once | INTRAMUSCULAR | Status: AC
Start: 2023-07-07 — End: 2023-07-07
  Administered 2023-07-07: 4 mg via INTRAVENOUS
  Filled 2023-07-07: qty 2

## 2023-07-07 MED ORDER — CEFUROXIME AXETIL 500 MG PO TABS: 500.0000 mg | ORAL_TABLET | Freq: Two times a day (BID) | ORAL | 0 refills | Status: AC

## 2023-07-07 MED ORDER — SODIUM CHLORIDE 0.9 % IV BOLUS (SEPSIS)
500.0000 mL | Freq: Once | INTRAVENOUS | Status: AC
  Administered 2023-07-07: 500 mL via INTRAVENOUS

## 2023-07-07 MED ORDER — CEFUROXIME AXETIL 500 MG PO TABS: 500.0000 mg | ORAL_TABLET | Freq: Two times a day (BID) | ORAL | 0 refills | Status: DC

## 2023-07-07 MED ORDER — DIPHENHYDRAMINE HCL 50 MG/ML IJ SOLN
12.5000 mg | Freq: Once | INTRAMUSCULAR | Status: AC
  Administered 2023-07-07: 12.5 mg via INTRAVENOUS
  Filled 2023-07-07: qty 1

## 2023-07-07 MED ORDER — HYDROMORPHONE HCL 1 MG/ML IJ SOLN
0.5000 mg | INTRAMUSCULAR | Status: AC | PRN
  Administered 2023-07-07 (×2): 0.5 mg via INTRAVENOUS
  Filled 2023-07-07 (×2): qty 1

## 2023-07-07 NOTE — ED Notes (Signed)
Pt states he is itchy. RN notified EDP

## 2023-07-07 NOTE — ED Notes (Signed)
Pt provided belongings to get dressed and called son to pick up

## 2023-07-07 NOTE — ED Provider Notes (Signed)
[place] EMERGENCY DEPARTMENT AT [facility] Provider Note   CSN: [id number] Arrival date & time: 07/07/23  8469     History {Add pertinent medical, surgical, social history, OB history to HPI:1} Chief complaint: Urinary discomfort  Antonio [name]. is a 67 y.o. male.  HPI   Patient presents to the ED for evaluation of pain in his lower abdomen and difficulty urinating that he attributes to his kidneys.  Patient has a complex history of diabetes hypertension hyperlipidemia osteomyelitis, acid reflux, coronary artery disease, peripheral vascular disease.  Patient has required amputations of his bilateral lower extremities.  Patient is followed by vascular surgery and continues to have difficulty with his right lower extremity where he only had a partial amputation of the foot.  Patient states he has been told that he needs another amputation but he has been trying to avoid that.  Patient does continue to smoke.  Patient states in the last day or 2 has had trouble with painful urination.  He is also urinating only small amounts.  He feels the pain radiates into his back and into his leg.  He has felt chilled but no documented fevers.  Home Medications Prior to Admission medications   Medication Sig Start Date End Date Taking? Authorizing Provider  amLODipine (NORVASC) 10 MG tablet Take 1 tablet (10 mg total) by mouth daily. 01/22/22 02/21/22  Angiulli, Mcarthur Rossetti, PA-C  aspirin EC 81 MG tablet Take 81 mg by mouth daily.    [provider]  atorvastatin (LIPITOR) 40 MG tablet Take 1 tablet (40 mg total) by mouth daily at 6 PM. 01/22/22   Angiulli, Mcarthur Rossetti, PA-C  carvedilol (COREG) 25 MG tablet Take 1 tablet (25 mg total) by mouth 2 (two) times daily with a meal. 01/22/22 02/21/22  Angiulli, Mcarthur Rossetti, PA-C  diclofenac Sodium (VOLTAREN) 1 % GEL Apply 2 g topically 4 (four) times daily. Patient taking differently: Apply 2 g topically 4 (four) times daily as needed (arthritis  pain). 01/22/22   Angiulli, Mcarthur Rossetti, PA-C  furosemide (LASIX) 40 MG tablet Take 1 tablet (40 mg total) by mouth daily. 01/23/22 01/23/23  Angiulli, Mcarthur Rossetti, PA-C  hydrALAZINE (APRESOLINE) 25 MG tablet Take 1 tablet (25 mg total) by mouth every 8 (eight) hours. 01/22/22 02/21/22  Angiulli, Mcarthur Rossetti, PA-C  insulin detemir (LEVEMIR) 100 UNIT/ML injection Inject 0.24 mLs (24 Units total) into the skin at bedtime. 01/22/22   Angiulli, Mcarthur Rossetti, PA-C  Insulin Syringe-Needle U-100 30G X 1/2" 0.5 ML MISC Use with Levemir 01/23/22   Angiulli, Mcarthur Rossetti, PA-C  irbesartan (AVAPRO) 75 MG tablet Take 1 tablet (75 mg total) by mouth daily. 01/22/22 02/21/22  Angiulli, Mcarthur Rossetti, PA-C  nystatin-triamcinolone ointment (MYCOLOG) Apply 1 application. topically 2 (two) times daily. Apply to right superior groin area above incision for yeast infection. 03/12/22   Lars Mage, PA-C  pantoprazole (PROTONIX) 40 MG tablet Take 1 tablet (40 mg total) by mouth daily. 01/22/22   Angiulli, Mcarthur Rossetti, PA-C  polyethylene glycol (MIRALAX / GLYCOLAX) 17 g packet Take 17 g by mouth daily. 02/11/22   Atway, Rayann N, DO  insulin glargine (LANTUS) 100 UNIT/ML injection Inject 0.05 mLs (5 Units total) into the skin at bedtime. 03/28/17 02/20/19  Howard Pouch, MD      Allergies    Iodinated contrast media and Penicillins    Review of Systems   Review of Systems  Physical Exam Updated Vital Signs BP (!) 195/98 (BP Location:  Left Arm)   Pulse 81   Temp 98.7 F (37.1 C) (Oral)   Resp 16   Ht 1.829 m (6')   Wt 90 kg   SpO2 99%   BMI 26.91 kg/m  Physical Exam Vitals and nursing note reviewed.  Constitutional:      Appearance: He is well-developed. He is not ill-appearing.  HENT:     Head: Normocephalic and atraumatic.     Right Ear: External ear normal.     Left Ear: External ear normal.  Eyes:     General: No scleral icterus.       Right eye: No discharge.        Left eye: No discharge.     Conjunctiva/sclera: Conjunctivae  normal.  Neck:     Trachea: No tracheal deviation.  Cardiovascular:     Rate and Rhythm: Normal rate and regular rhythm.  Pulmonary:     Effort: Pulmonary effort is normal. No respiratory distress.     Breath sounds: Normal breath sounds. No stridor. No wheezing or rales.  Abdominal:     General: Bowel sounds are normal. There is no distension.     Palpations: Abdomen is soft.     Tenderness: There is abdominal tenderness. There is no guarding or rebound.     Comments: Palpation suprapubic region, no CVA tenderness  Musculoskeletal:        General: No tenderness or deformity.     Cervical back: Neck supple.     Right lower leg: Edema present.     Comments: Status post amputation bilateral lower extremities, partial amputation of foot in the right lower extremity, patient has persistent edema and venous stasis changes in the right lower extremity, patient states this is unchanged from baseline  Skin:    General: Skin is warm and dry.     Findings: No rash.  Neurological:     General: No focal deficit present.     Mental Status: He is alert.     Cranial Nerves: No cranial nerve deficit, dysarthria or facial asymmetry.     Sensory: No sensory deficit.     Motor: No abnormal muscle tone or seizure activity.     Coordination: Coordination normal.  Psychiatric:        Mood and Affect: Mood normal.     ED Results / Procedures / Treatments   Labs (all labs ordered are listed, but only abnormal results are displayed) Labs Reviewed  URINALYSIS, W/ REFLEX TO CULTURE (INFECTION SUSPECTED)  CBC WITH DIFFERENTIAL/PLATELET  COMPREHENSIVE METABOLIC PANEL  LIPASE, BLOOD  I-STAT CG4 LACTIC ACID, ED    EKG None  Radiology No results found.  Procedures Procedures  {Document cardiac monitor, telemetry assessment procedure when appropriate:1}  Medications Ordered in ED Medications  ondansetron (ZOFRAN) injection 4 mg (has no administration in time range)  HYDROmorphone (DILAUDID)  injection 0.5 mg (has no administration in time range)  sodium chloride 0.9 % bolus 500 mL (has no administration in time range)    Followed by  0.9 %  sodium chloride infusion (has no administration in time range)    ED Course/ Medical Decision Making/ A&P   {   Click here for ABCD2, HEART and other calculatorsREFRESH Note before signing :1}                              Medical Decision Making Amount and/or Complexity of Data Reviewed Labs: ordered.  Risk Prescription drug  management.   ***  {Document critical care time when appropriate:1} {Document review of labs and clinical decision tools ie heart score, Chads2Vasc2 etc:1}  {Document your independent review of radiology images, and any outside records:1} {Document your discussion with family members, caretakers, and with consultants:1} {Document social determinants of health affecting pt's care:1} {Document your decision making why or why not admission, treatments were needed:1} Final Clinical Impression(s) / ED Diagnoses Final diagnoses:  None    Rx / DC Orders ED Discharge Orders     None

## 2023-07-07 NOTE — ED Notes (Signed)
Provided pt bag lunch 

## 2023-07-07 NOTE — Discharge Instructions (Signed)
Take the antibiotics as prescribed to treat the urine infection.  Take the medications, Pyridium and Percocet to help with your pain.  The Pyridium will change the color of your urine but that will resolve once you stop taking it.  Follow-up with a urologist for further evaluation of the bladder thickening noted on the CT scan.  Call their office to schedule an appointment

## 2023-07-07 NOTE — ED Notes (Signed)
Pt states pain medication isn't working. RN informed EDP

## 2023-07-07 NOTE — ED Triage Notes (Signed)
Pt states he has kidney issues d/t not being able to empty bladder fully and pain in lower groin area. Pt says he has poor circulation in his legs. Pt has right toes amputated and left BKA

## 2023-07-07 NOTE — ED Notes (Signed)
RN bladder scanned pt with result of 87 ml

## 2023-07-07 NOTE — ED Notes (Signed)
Pt states pulse ox and BP cuff irritating skin and want it removed. RN placed pulse ox on ear.

## 2023-08-15 ENCOUNTER — Emergency Department (HOSPITAL_BASED_OUTPATIENT_CLINIC_OR_DEPARTMENT_OTHER): Payer: Medicare Other

## 2023-08-15 ENCOUNTER — Encounter (HOSPITAL_COMMUNITY): Admission: EM | Disposition: E | Payer: Self-pay | Source: Home / Self Care | Attending: Internal Medicine

## 2023-08-15 ENCOUNTER — Inpatient Hospital Stay (HOSPITAL_COMMUNITY): Payer: Medicare Other

## 2023-08-15 ENCOUNTER — Other Ambulatory Visit: Payer: Self-pay

## 2023-08-15 ENCOUNTER — Inpatient Hospital Stay (HOSPITAL_COMMUNITY)
Admission: EM | Admit: 2023-08-15 | Discharge: 2023-08-27 | DRG: 260 | Disposition: E | Payer: Medicare Other | Attending: Critical Care Medicine | Admitting: Critical Care Medicine

## 2023-08-15 ENCOUNTER — Emergency Department (HOSPITAL_COMMUNITY): Payer: Medicare Other

## 2023-08-15 ENCOUNTER — Encounter (HOSPITAL_COMMUNITY): Payer: Medicare Other

## 2023-08-15 ENCOUNTER — Encounter (HOSPITAL_COMMUNITY): Payer: Self-pay | Admitting: Interventional Cardiology

## 2023-08-15 DIAGNOSIS — R52 Pain, unspecified: Secondary | ICD-10-CM

## 2023-08-15 DIAGNOSIS — I70222 Atherosclerosis of native arteries of extremities with rest pain, left leg: Secondary | ICD-10-CM | POA: Diagnosis present

## 2023-08-15 DIAGNOSIS — Z7189 Other specified counseling: Secondary | ICD-10-CM

## 2023-08-15 DIAGNOSIS — R739 Hyperglycemia, unspecified: Secondary | ICD-10-CM

## 2023-08-15 DIAGNOSIS — I998 Other disorder of circulatory system: Secondary | ICD-10-CM | POA: Diagnosis not present

## 2023-08-15 DIAGNOSIS — A419 Sepsis, unspecified organism: Secondary | ICD-10-CM | POA: Diagnosis present

## 2023-08-15 DIAGNOSIS — Z7982 Long term (current) use of aspirin: Secondary | ICD-10-CM

## 2023-08-15 DIAGNOSIS — E871 Hypo-osmolality and hyponatremia: Secondary | ICD-10-CM | POA: Diagnosis not present

## 2023-08-15 DIAGNOSIS — Y835 Amputation of limb(s) as the cause of abnormal reaction of the patient, or of later complication, without mention of misadventure at the time of the procedure: Secondary | ICD-10-CM | POA: Diagnosis present

## 2023-08-15 DIAGNOSIS — M7989 Other specified soft tissue disorders: Secondary | ICD-10-CM | POA: Diagnosis not present

## 2023-08-15 DIAGNOSIS — Z515 Encounter for palliative care: Secondary | ICD-10-CM

## 2023-08-15 DIAGNOSIS — I130 Hypertensive heart and chronic kidney disease with heart failure and stage 1 through stage 4 chronic kidney disease, or unspecified chronic kidney disease: Secondary | ICD-10-CM | POA: Diagnosis present

## 2023-08-15 DIAGNOSIS — I2510 Atherosclerotic heart disease of native coronary artery without angina pectoris: Secondary | ICD-10-CM | POA: Diagnosis not present

## 2023-08-15 DIAGNOSIS — I4901 Ventricular fibrillation: Secondary | ICD-10-CM | POA: Diagnosis present

## 2023-08-15 DIAGNOSIS — R001 Bradycardia, unspecified: Secondary | ICD-10-CM | POA: Diagnosis present

## 2023-08-15 DIAGNOSIS — E875 Hyperkalemia: Secondary | ICD-10-CM | POA: Diagnosis present

## 2023-08-15 DIAGNOSIS — Z91041 Radiographic dye allergy status: Secondary | ICD-10-CM

## 2023-08-15 DIAGNOSIS — Z95828 Presence of other vascular implants and grafts: Secondary | ICD-10-CM

## 2023-08-15 DIAGNOSIS — E1122 Type 2 diabetes mellitus with diabetic chronic kidney disease: Secondary | ICD-10-CM | POA: Diagnosis present

## 2023-08-15 DIAGNOSIS — I214 Non-ST elevation (NSTEMI) myocardial infarction: Secondary | ICD-10-CM | POA: Diagnosis not present

## 2023-08-15 DIAGNOSIS — G8929 Other chronic pain: Secondary | ICD-10-CM | POA: Diagnosis present

## 2023-08-15 DIAGNOSIS — I469 Cardiac arrest, cause unspecified: Principal | ICD-10-CM | POA: Insufficient documentation

## 2023-08-15 DIAGNOSIS — D696 Thrombocytopenia, unspecified: Secondary | ICD-10-CM | POA: Diagnosis present

## 2023-08-15 DIAGNOSIS — N390 Urinary tract infection, site not specified: Secondary | ICD-10-CM | POA: Diagnosis present

## 2023-08-15 DIAGNOSIS — R6521 Severe sepsis with septic shock: Secondary | ICD-10-CM | POA: Diagnosis present

## 2023-08-15 DIAGNOSIS — G9341 Metabolic encephalopathy: Secondary | ICD-10-CM | POA: Diagnosis present

## 2023-08-15 DIAGNOSIS — Z8249 Family history of ischemic heart disease and other diseases of the circulatory system: Secondary | ICD-10-CM

## 2023-08-15 DIAGNOSIS — N1832 Chronic kidney disease, stage 3b: Secondary | ICD-10-CM | POA: Diagnosis present

## 2023-08-15 DIAGNOSIS — I2721 Secondary pulmonary arterial hypertension: Secondary | ICD-10-CM

## 2023-08-15 DIAGNOSIS — R34 Anuria and oliguria: Secondary | ICD-10-CM | POA: Diagnosis not present

## 2023-08-15 DIAGNOSIS — Z22322 Carrier or suspected carrier of Methicillin resistant Staphylococcus aureus: Secondary | ICD-10-CM

## 2023-08-15 DIAGNOSIS — F17210 Nicotine dependence, cigarettes, uncomplicated: Secondary | ICD-10-CM | POA: Diagnosis present

## 2023-08-15 DIAGNOSIS — I2781 Cor pulmonale (chronic): Secondary | ICD-10-CM | POA: Diagnosis present

## 2023-08-15 DIAGNOSIS — Z8616 Personal history of COVID-19: Secondary | ICD-10-CM

## 2023-08-15 DIAGNOSIS — E1151 Type 2 diabetes mellitus with diabetic peripheral angiopathy without gangrene: Secondary | ICD-10-CM | POA: Diagnosis present

## 2023-08-15 DIAGNOSIS — E785 Hyperlipidemia, unspecified: Secondary | ICD-10-CM | POA: Diagnosis present

## 2023-08-15 DIAGNOSIS — I441 Atrioventricular block, second degree: Secondary | ICD-10-CM | POA: Diagnosis not present

## 2023-08-15 DIAGNOSIS — M79604 Pain in right leg: Secondary | ICD-10-CM

## 2023-08-15 DIAGNOSIS — Z66 Do not resuscitate: Secondary | ICD-10-CM | POA: Diagnosis not present

## 2023-08-15 DIAGNOSIS — I2582 Chronic total occlusion of coronary artery: Secondary | ICD-10-CM | POA: Diagnosis present

## 2023-08-15 DIAGNOSIS — E874 Mixed disorder of acid-base balance: Secondary | ICD-10-CM | POA: Diagnosis present

## 2023-08-15 DIAGNOSIS — K219 Gastro-esophageal reflux disease without esophagitis: Secondary | ICD-10-CM | POA: Diagnosis present

## 2023-08-15 DIAGNOSIS — L89899 Pressure ulcer of other site, unspecified stage: Secondary | ICD-10-CM | POA: Diagnosis present

## 2023-08-15 DIAGNOSIS — E440 Moderate protein-calorie malnutrition: Secondary | ICD-10-CM | POA: Insufficient documentation

## 2023-08-15 DIAGNOSIS — Z833 Family history of diabetes mellitus: Secondary | ICD-10-CM

## 2023-08-15 DIAGNOSIS — E1165 Type 2 diabetes mellitus with hyperglycemia: Secondary | ICD-10-CM | POA: Diagnosis present

## 2023-08-15 DIAGNOSIS — Z781 Physical restraint status: Secondary | ICD-10-CM

## 2023-08-15 DIAGNOSIS — I462 Cardiac arrest due to underlying cardiac condition: Secondary | ICD-10-CM | POA: Diagnosis present

## 2023-08-15 DIAGNOSIS — T8789 Other complications of amputation stump: Secondary | ICD-10-CM | POA: Diagnosis present

## 2023-08-15 DIAGNOSIS — I5082 Biventricular heart failure: Secondary | ICD-10-CM | POA: Diagnosis not present

## 2023-08-15 DIAGNOSIS — N179 Acute kidney failure, unspecified: Secondary | ICD-10-CM | POA: Diagnosis not present

## 2023-08-15 DIAGNOSIS — I70221 Atherosclerosis of native arteries of extremities with rest pain, right leg: Secondary | ICD-10-CM | POA: Diagnosis present

## 2023-08-15 DIAGNOSIS — Z880 Allergy status to penicillin: Secondary | ICD-10-CM

## 2023-08-15 DIAGNOSIS — Z79899 Other long term (current) drug therapy: Secondary | ICD-10-CM

## 2023-08-15 DIAGNOSIS — M79606 Pain in leg, unspecified: Secondary | ICD-10-CM

## 2023-08-15 DIAGNOSIS — J9601 Acute respiratory failure with hypoxia: Secondary | ICD-10-CM | POA: Diagnosis present

## 2023-08-15 DIAGNOSIS — R570 Cardiogenic shock: Secondary | ICD-10-CM | POA: Diagnosis present

## 2023-08-15 DIAGNOSIS — E861 Hypovolemia: Secondary | ICD-10-CM | POA: Diagnosis not present

## 2023-08-15 DIAGNOSIS — N170 Acute kidney failure with tubular necrosis: Secondary | ICD-10-CM | POA: Diagnosis not present

## 2023-08-15 DIAGNOSIS — Z794 Long term (current) use of insulin: Secondary | ICD-10-CM

## 2023-08-15 DIAGNOSIS — I5023 Acute on chronic systolic (congestive) heart failure: Secondary | ICD-10-CM

## 2023-08-15 DIAGNOSIS — J439 Emphysema, unspecified: Secondary | ICD-10-CM | POA: Diagnosis present

## 2023-08-15 DIAGNOSIS — E876 Hypokalemia: Secondary | ICD-10-CM | POA: Diagnosis not present

## 2023-08-15 DIAGNOSIS — I5020 Unspecified systolic (congestive) heart failure: Secondary | ICD-10-CM

## 2023-08-15 DIAGNOSIS — Z6826 Body mass index (BMI) 26.0-26.9, adult: Secondary | ICD-10-CM

## 2023-08-15 DIAGNOSIS — I5021 Acute systolic (congestive) heart failure: Secondary | ICD-10-CM | POA: Diagnosis not present

## 2023-08-15 DIAGNOSIS — J9602 Acute respiratory failure with hypercapnia: Secondary | ICD-10-CM | POA: Diagnosis present

## 2023-08-15 DIAGNOSIS — Z955 Presence of coronary angioplasty implant and graft: Secondary | ICD-10-CM

## 2023-08-15 DIAGNOSIS — I272 Pulmonary hypertension, unspecified: Secondary | ICD-10-CM

## 2023-08-15 DIAGNOSIS — I442 Atrioventricular block, complete: Secondary | ICD-10-CM | POA: Diagnosis present

## 2023-08-15 DIAGNOSIS — I3139 Other pericardial effusion (noninflammatory): Secondary | ICD-10-CM | POA: Diagnosis present

## 2023-08-15 DIAGNOSIS — R569 Unspecified convulsions: Secondary | ICD-10-CM | POA: Diagnosis not present

## 2023-08-15 DIAGNOSIS — Z825 Family history of asthma and other chronic lower respiratory diseases: Secondary | ICD-10-CM

## 2023-08-15 DIAGNOSIS — I255 Ischemic cardiomyopathy: Secondary | ICD-10-CM | POA: Diagnosis present

## 2023-08-15 DIAGNOSIS — Z89421 Acquired absence of other right toe(s): Secondary | ICD-10-CM

## 2023-08-15 DIAGNOSIS — I160 Hypertensive urgency: Secondary | ICD-10-CM | POA: Diagnosis present

## 2023-08-15 HISTORY — PX: TEMPORARY PACEMAKER: CATH118268

## 2023-08-15 LAB — CBC
HCT: 46.8 % (ref 39.0–52.0)
Hemoglobin: 13.8 g/dL (ref 13.0–17.0)
MCH: 25.1 pg — ABNORMAL LOW (ref 26.0–34.0)
MCHC: 29.5 g/dL — ABNORMAL LOW (ref 30.0–36.0)
MCV: 85.2 fL (ref 80.0–100.0)
Platelets: 391 10*3/uL (ref 150–400)
RBC: 5.49 MIL/uL (ref 4.22–5.81)
RDW: 14.6 % (ref 11.5–15.5)
WBC: 31.5 10*3/uL — ABNORMAL HIGH (ref 4.0–10.5)
nRBC: 0.1 % (ref 0.0–0.2)

## 2023-08-15 LAB — COMPREHENSIVE METABOLIC PANEL
ALT: 10 U/L (ref 0–44)
ALT: 28 U/L (ref 0–44)
AST: 12 U/L — ABNORMAL LOW (ref 15–41)
AST: 82 U/L — ABNORMAL HIGH (ref 15–41)
Albumin: 2.4 g/dL — ABNORMAL LOW (ref 3.5–5.0)
Albumin: 1.9 g/dL — ABNORMAL LOW (ref 3.5–5.0)
Alkaline Phosphatase: 80 U/L (ref 38–126)
Alkaline Phosphatase: 89 U/L (ref 38–126)
Anion gap: 10 (ref 5–15)
Anion gap: 11 (ref 5–15)
BUN: 31 mg/dL — ABNORMAL HIGH (ref 8–23)
BUN: 32 mg/dL — ABNORMAL HIGH (ref 8–23)
CO2: 24 mmol/L (ref 22–32)
CO2: 24 mmol/L (ref 22–32)
Calcium: 8.5 mg/dL — ABNORMAL LOW (ref 8.9–10.3)
Calcium: 8.4 mg/dL — ABNORMAL LOW (ref 8.9–10.3)
Chloride: 103 mmol/L (ref 98–111)
Chloride: 106 mmol/L (ref 98–111)
Creatinine, Ser: 2.32 mg/dL — ABNORMAL HIGH (ref 0.61–1.24)
Creatinine, Ser: 2.83 mg/dL — ABNORMAL HIGH (ref 0.61–1.24)
GFR, Estimated: 30 mL/min — ABNORMAL LOW (ref >60)
GFR, Estimated: 24 mL/min — ABNORMAL LOW (ref >60)
Glucose, Bld: 108 mg/dL — ABNORMAL HIGH (ref 70–99)
Glucose, Bld: 207 mg/dL — ABNORMAL HIGH (ref 70–99)
Potassium: 4.9 mmol/L (ref 3.5–5.1)
Potassium: 4.7 mmol/L (ref 3.5–5.1)
Sodium: 137 mmol/L (ref 135–145)
Sodium: 141 mmol/L (ref 135–145)
Total Bilirubin: 0.6 mg/dL (ref 0.3–1.2)
Total Bilirubin: 0.8 mg/dL (ref 0.3–1.2)
Total Protein: 6.3 g/dL — ABNORMAL LOW (ref 6.5–8.1)
Total Protein: 5.4 g/dL — ABNORMAL LOW (ref 6.5–8.1)

## 2023-08-15 LAB — HIV ANTIBODY (ROUTINE TESTING W REFLEX): HIV Screen 4th Generation wRfx: NONREACTIVE

## 2023-08-15 LAB — POCT I-STAT 7, (LYTES, BLD GAS, ICA,H+H)
Acid-Base Excess: 2 mmol/L (ref 0.0–2.0)
Bicarbonate: 31.5 mmol/L — ABNORMAL HIGH (ref 20.0–28.0)
Calcium, Ion: 1.3 mmol/L (ref 1.15–1.40)
HCT: 43 % (ref 39.0–52.0)
Hemoglobin: 14.6 g/dL (ref 13.0–17.0)
O2 Saturation: 100 %
Potassium: 3.9 mmol/L (ref 3.5–5.1)
Sodium: 143 mmol/L (ref 135–145)
TCO2: 34 mmol/L — ABNORMAL HIGH (ref 22–32)
pCO2 arterial: 71.2 mmHg (ref 32–48)
pH, Arterial: 7.253 — ABNORMAL LOW (ref 7.35–7.45)
pO2, Arterial: 379 mmHg — ABNORMAL HIGH (ref 83–108)

## 2023-08-15 LAB — I-STAT CG4 LACTIC ACID, ED: Lactic Acid, Venous: 6.9 mmol/L (ref 0.5–1.9)

## 2023-08-15 LAB — URINALYSIS, W/ REFLEX TO CULTURE (INFECTION SUSPECTED)
Bilirubin Urine: NEGATIVE
Glucose, UA: 150 mg/dL — AB
Ketones, ur: NEGATIVE mg/dL
Nitrite: NEGATIVE
Protein, ur: >=300 mg/dL — AB
Specific Gravity, Urine: 1.019 (ref 1.005–1.030)
WBC, UA: >50 WBC/hpf (ref 0–5)
pH: 6 (ref 5.0–8.0)

## 2023-08-15 LAB — I-STAT VENOUS BLOOD GAS, ED
Acid-base deficit: 8 mmol/L — ABNORMAL HIGH (ref 0.0–2.0)
Bicarbonate: 24.3 mmol/L (ref 20.0–28.0)
Calcium, Ion: 1.28 mmol/L (ref 1.15–1.40)
HCT: 45 % (ref 39.0–52.0)
Hemoglobin: 15.3 g/dL (ref 13.0–17.0)
O2 Saturation: 64 %
Potassium: 4.9 mmol/L (ref 3.5–5.1)
Sodium: 140 mmol/L (ref 135–145)
TCO2: 27 mmol/L (ref 22–32)
pCO2, Ven: 83.1 mmHg (ref 44–60)
pH, Ven: 7.074 — CL (ref 7.25–7.43)
pO2, Ven: 48 mmHg — ABNORMAL HIGH (ref 32–45)

## 2023-08-15 LAB — CBC WITH DIFFERENTIAL/PLATELET
Abs Immature Granulocytes: 0.04 10*3/uL (ref 0.00–0.07)
Basophils Absolute: 0 10*3/uL (ref 0.0–0.1)
Basophils Relative: 1 %
Eosinophils Absolute: 0.2 10*3/uL (ref 0.0–0.5)
Eosinophils Relative: 3 %
HCT: 43.1 % (ref 39.0–52.0)
Hemoglobin: 12.9 g/dL — ABNORMAL LOW (ref 13.0–17.0)
Immature Granulocytes: 1 %
Lymphocytes Relative: 16 %
Lymphs Abs: 1.2 10*3/uL (ref 0.7–4.0)
MCH: 25 pg — ABNORMAL LOW (ref 26.0–34.0)
MCHC: 29.9 g/dL — ABNORMAL LOW (ref 30.0–36.0)
MCV: 83.7 fL (ref 80.0–100.0)
Monocytes Absolute: 0.6 10*3/uL (ref 0.1–1.0)
Monocytes Relative: 7 %
Neutro Abs: 5.7 10*3/uL (ref 1.7–7.7)
Neutrophils Relative %: 72 %
Platelets: 268 10*3/uL (ref 150–400)
RBC: 5.15 MIL/uL (ref 4.22–5.81)
RDW: 14.6 % (ref 11.5–15.5)
WBC: 7.8 10*3/uL (ref 4.0–10.5)
nRBC: 0 % (ref 0.0–0.2)

## 2023-08-15 LAB — I-STAT CHEM 8, ED
BUN: 39 mg/dL — ABNORMAL HIGH (ref 8–23)
Calcium, Ion: 1.28 mmol/L (ref 1.15–1.40)
Chloride: 107 mmol/L (ref 98–111)
Creatinine, Ser: 2.2 mg/dL — ABNORMAL HIGH (ref 0.61–1.24)
Glucose, Bld: 215 mg/dL — ABNORMAL HIGH (ref 70–99)
HCT: 46 % (ref 39.0–52.0)
Hemoglobin: 15.6 g/dL (ref 13.0–17.0)
Potassium: 4.9 mmol/L (ref 3.5–5.1)
Sodium: 140 mmol/L (ref 135–145)
TCO2: 25 mmol/L (ref 22–32)

## 2023-08-15 LAB — MRSA NEXT GEN BY PCR, NASAL: MRSA by PCR Next Gen: DETECTED — AB

## 2023-08-15 LAB — MAGNESIUM: Magnesium: 2.9 mg/dL — ABNORMAL HIGH (ref 1.7–2.4)

## 2023-08-15 LAB — TROPONIN I (HIGH SENSITIVITY): Troponin I (High Sensitivity): 6747 ng/L (ref <18)

## 2023-08-15 LAB — LACTIC ACID, PLASMA: Lactic Acid, Venous: 5.4 mmol/L (ref 0.5–1.9)

## 2023-08-15 LAB — LIPASE, BLOOD: Lipase: 30 U/L (ref 11–51)

## 2023-08-15 SURGERY — TEMPORARY PACEMAKER
Anesthesia: LOCAL

## 2023-08-15 MED ORDER — ROCURONIUM BROMIDE 50 MG/5ML IV SOLN
INTRAVENOUS | Status: AC | PRN
Start: 2023-08-15 — End: 2023-08-15
  Administered 2023-08-15: 100 mg via INTRAVENOUS

## 2023-08-15 MED ORDER — FENTANYL CITRATE (PF) 100 MCG/2ML IJ SOLN
INTRAMUSCULAR | Status: DC | PRN
  Administered 2023-08-15: 50 ug via INTRAVENOUS

## 2023-08-15 MED ORDER — SODIUM BICARBONATE 8.4 % IV SOLN
INTRAVENOUS | Status: DC | PRN
  Administered 2023-08-15: 100 meq via INTRAVENOUS

## 2023-08-15 MED ORDER — HYDRALAZINE HCL 20 MG/ML IJ SOLN: 10.0000 mg | INTRAMUSCULAR | Status: AC | PRN

## 2023-08-15 MED ORDER — LABETALOL HCL 5 MG/ML IV SOLN: 10.0000 mg | INTRAVENOUS | Status: AC | PRN

## 2023-08-15 MED ORDER — AMIODARONE HCL 150 MG/3ML IV SOLN
INTRAVENOUS | Status: AC | PRN
Start: 2023-08-15 — End: 2023-08-15
  Administered 2023-08-15: 300 mg via INTRAVENOUS

## 2023-08-15 MED ORDER — FAMOTIDINE 20 MG PO TABS
20.0000 mg | ORAL_TABLET | Freq: Two times a day (BID) | ORAL | Status: DC
  Administered 2023-08-16 – 2023-08-17 (×3): 20 mg
  Filled 2023-08-15 (×4): qty 1

## 2023-08-15 MED ORDER — MIDAZOLAM-SODIUM CHLORIDE 100-0.9 MG/100ML-% IV SOLN
INTRAVENOUS | Status: DC | PRN
  Administered 2023-08-15: 2 mg/h via INTRAVENOUS

## 2023-08-15 MED ORDER — SODIUM CHLORIDE 0.9 % IV BOLUS
1000.0000 mL | Freq: Once | INTRAVENOUS | Status: AC
  Administered 2023-08-15: 1000 mL via INTRAVENOUS

## 2023-08-15 MED ORDER — LACTATED RINGERS IV BOLUS: 1000.0000 mL | Freq: Once | INTRAVENOUS | Status: DC

## 2023-08-15 MED ORDER — SODIUM CHLORIDE 0.9 % IV SOLN
1.0000 g | Freq: Once | INTRAVENOUS | Status: AC
  Administered 2023-08-15: 1 g via INTRAVENOUS
  Filled 2023-08-15: qty 10

## 2023-08-15 MED ORDER — CALCIUM CHLORIDE 10 % IV SOLN
INTRAVENOUS | Status: AC
  Filled 2023-08-15: qty 10

## 2023-08-15 MED ORDER — ONDANSETRON HCL 4 MG/2ML IJ SOLN: 4.0000 mg | Freq: Four times a day (QID) | INTRAMUSCULAR | Status: DC | PRN

## 2023-08-15 MED ORDER — ETOMIDATE 2 MG/ML IV SOLN
INTRAVENOUS | Status: AC | PRN
  Administered 2023-08-15: 20 mg via INTRAVENOUS

## 2023-08-15 MED ORDER — SODIUM CHLORIDE 0.9% FLUSH
3.0000 mL | Freq: Two times a day (BID) | INTRAVENOUS | Status: DC
  Administered 2023-08-15 – 2023-08-24 (×16): 3 mL via INTRAVENOUS

## 2023-08-15 MED ORDER — ACETAMINOPHEN 325 MG PO TABS: 650.0000 mg | ORAL_TABLET | ORAL | Status: DC | PRN

## 2023-08-15 MED ORDER — SODIUM BICARBONATE 8.4 % IV SOLN
INTRAVENOUS | Status: AC
  Filled 2023-08-15: qty 100

## 2023-08-15 MED ORDER — CALCIUM CHLORIDE 10 % IV SOLN
INTRAVENOUS | Status: AC | PRN
Start: 2023-08-15 — End: 2023-08-15
  Administered 2023-08-15: 1 g via INTRAVENOUS

## 2023-08-15 MED ORDER — DOCUSATE SODIUM 50 MG/5ML PO LIQD: 100.0000 mg | Freq: Two times a day (BID) | ORAL | Status: DC | PRN

## 2023-08-15 MED ORDER — POLYETHYLENE GLYCOL 3350 17 G PO PACK: 17.0000 g | PACK | Freq: Every day | ORAL | Status: DC | PRN

## 2023-08-15 MED ORDER — HYDROMORPHONE HCL 1 MG/ML IJ SOLN
1.0000 mg | Freq: Once | INTRAMUSCULAR | Status: AC
  Administered 2023-08-15: 1 mg via INTRAVENOUS
  Filled 2023-08-15: qty 1

## 2023-08-15 MED ORDER — EPINEPHRINE HCL 5 MG/250ML IV SOLN IN NS: 0.5000 ug/min | INTRAVENOUS | Status: DC

## 2023-08-15 MED ORDER — HYDROMORPHONE HCL 1 MG/ML IJ SOLN
1.0000 mg | Freq: Once | INTRAMUSCULAR | Status: DC
  Filled 2023-08-15: qty 1

## 2023-08-15 MED ORDER — SODIUM BICARBONATE 8.4 % IV SOLN
INTRAVENOUS | Status: AC | PRN
Start: 2023-08-15 — End: 2023-08-15
  Administered 2023-08-15: 50 meq via INTRAVENOUS

## 2023-08-15 MED ORDER — LIDOCAINE HCL (PF) 1 % IJ SOLN
INTRAMUSCULAR | Status: DC | PRN
  Administered 2023-08-15: 10 mL

## 2023-08-15 MED ORDER — MIDAZOLAM-SODIUM CHLORIDE 100-0.9 MG/100ML-% IV SOLN
0.5000 mg/h | INTRAVENOUS | Status: DC
  Administered 2023-08-15: 2 mg/h via INTRAVENOUS

## 2023-08-15 MED ORDER — SODIUM BICARBONATE 8.4 % IV SOLN
INTRAVENOUS | Status: AC | PRN
  Administered 2023-08-15: 100 meq via INTRAVENOUS

## 2023-08-15 MED ORDER — SODIUM CHLORIDE 0.9% FLUSH
3.0000 mL | INTRAVENOUS | Status: DC | PRN
  Administered 2023-08-19: 3 mL via INTRAVENOUS
  Administered 2023-08-19: 10 mL via INTRAVENOUS

## 2023-08-15 MED ORDER — LORAZEPAM 2 MG/ML IJ SOLN
INTRAMUSCULAR | Status: AC
  Filled 2023-08-15: qty 1

## 2023-08-15 MED ORDER — FENTANYL 2500MCG IN NS 250ML (10MCG/ML) PREMIX INFUSION
0.0000 ug/h | INTRAVENOUS | Status: DC
  Administered 2023-08-15 (×2): 50 ug/h via INTRAVENOUS
  Administered 2023-08-16: 100 ug/h via INTRAVENOUS
  Filled 2023-08-15 (×2): qty 250

## 2023-08-15 MED ORDER — ENOXAPARIN SODIUM 40 MG/0.4ML IJ SOSY
40.0000 mg | PREFILLED_SYRINGE | INTRAMUSCULAR | Status: DC
  Administered 2023-08-16: 40 mg via SUBCUTANEOUS
  Filled 2023-08-15: qty 0.4

## 2023-08-15 MED ORDER — CALCIUM CHLORIDE 10 % IV SOLN
INTRAVENOUS | Status: DC | PRN
  Administered 2023-08-15: 1 g via INTRAVENOUS

## 2023-08-15 MED ORDER — FENTANYL CITRATE (PF) 100 MCG/2ML IJ SOLN
INTRAMUSCULAR | Status: AC
  Filled 2023-08-15: qty 2

## 2023-08-15 MED ORDER — SODIUM CHLORIDE 0.9 % IV SOLN
1.0000 g | Freq: Three times a day (TID) | INTRAVENOUS | Status: DC
  Administered 2023-08-16 (×2): 1 g via INTRAVENOUS
  Filled 2023-08-15 (×4): qty 5

## 2023-08-15 MED ORDER — LIDOCAINE HCL (PF) 1 % IJ SOLN
INTRAMUSCULAR | Status: AC
  Filled 2023-08-15: qty 30

## 2023-08-15 MED ORDER — MIDAZOLAM HCL 2 MG/2ML IJ SOLN
INTRAMUSCULAR | Status: AC
  Filled 2023-08-15: qty 2

## 2023-08-15 MED ORDER — EPINEPHRINE 1 MG/10ML IJ SOSY
PREFILLED_SYRINGE | INTRAMUSCULAR | Status: AC | PRN
  Administered 2023-08-15 (×3): 1 mg via INTRAVENOUS

## 2023-08-15 MED ORDER — EPINEPHRINE HCL 5 MG/250ML IV SOLN IN NS
0.5000 ug/min | INTRAVENOUS | Status: DC
  Administered 2023-08-15: 2 ug/min via INTRAVENOUS

## 2023-08-15 MED ORDER — NOREPINEPHRINE 4 MG/250ML-% IV SOLN
0.0000 ug/min | INTRAVENOUS | Status: DC
  Administered 2023-08-15: 5 ug/min via INTRAVENOUS
  Administered 2023-08-16 – 2023-08-18 (×2): 4 ug/min via INTRAVENOUS
  Filled 2023-08-15 (×2): qty 250

## 2023-08-15 MED ORDER — EPINEPHRINE 1 MG/10ML IJ SOSY
PREFILLED_SYRINGE | INTRAMUSCULAR | Status: AC | PRN
Start: 2023-08-15 — End: 2023-08-15
  Administered 2023-08-15: 1 mg via INTRAVENOUS

## 2023-08-15 MED ORDER — FENTANYL 2500MCG IN NS 250ML (10MCG/ML) PREMIX INFUSION
INTRAVENOUS | Status: DC | PRN
  Administered 2023-08-15: 50 ug/h via INTRAVENOUS

## 2023-08-15 MED ORDER — SODIUM CHLORIDE 0.9 % IV SOLN: INTRAVENOUS | Status: DC

## 2023-08-15 MED ORDER — NOREPINEPHRINE BITARTRATE 1 MG/ML IV SOLN
INTRAVENOUS | Status: DC | PRN
  Administered 2023-08-15: 5 ug/min
  Administered 2023-08-15: 10 ug/min via INTRAVENOUS
  Administered 2023-08-15: 6 ug/min via INTRAVENOUS

## 2023-08-15 MED ORDER — SODIUM CHLORIDE 0.9 % IV SOLN: 250.0000 mL | INTRAVENOUS | Status: DC | PRN

## 2023-08-15 MED ORDER — MIDAZOLAM HCL 2 MG/2ML IJ SOLN
INTRAMUSCULAR | Status: DC | PRN
  Administered 2023-08-15: 2 mg via INTRAVENOUS

## 2023-08-15 SURGICAL SUPPLY — 6 items
CATH S G BIP PACING (CATHETERS) IMPLANT
PACK CARDIAC CATHETERIZATION (CUSTOM PROCEDURE TRAY) IMPLANT
PROTECTION STATION PRESSURIZED (MISCELLANEOUS) ×1 IMPLANT
SHEATH PINNACLE 6F 10CM (SHEATH) IMPLANT
SHEATH PROBE COVER 6X72 (BAG) IMPLANT
STATION PROTECTION PRESSURIZED (MISCELLANEOUS) IMPLANT

## 2023-08-15 NOTE — Code Documentation (Signed)
Compressions started.

## 2023-08-15 NOTE — Progress Notes (Signed)
Lower ext arterial duplex  has been completed. Refer to [facility] under chart review to view preliminary results.   08/17/2023  3:13 PM Sigmund Morera, Gerarda Gunther

## 2023-08-15 NOTE — H&P (Signed)
NAME:  Antonio Reinisch., MRN:  [id number], DOB:  14-Jun-1956, LOS: 0 ADMISSION DATE:  07/28/2023, CONSULTATION DATE:  9/19 REFERRING MD:  Dr. Criss Alvine, CHIEF COMPLAINT:  cardiac arrest   History of Present Illness:  Patient is a 67 yo M w/ pertinent PMH of HTN, CAD s/p stent placement, PAD s/p L BKA and LSFA stenting 2013, T2DM, right foot osteomyelitis s/p right iliofemoral endarterectomy and right metatarsal amputation presents to [facility] ED on 9/19 w/ UTI.  Patient having UTI symptoms for the past 4-5 days. Also having b/l back pain and abd pain. Also having chronic right leg pain that has been progressively worsening over the last 4-5 months. On arrival patient alert and hypertensive 162/120. Afebrile and wbc 7.8. UA w/ leukocytes and uc pending. Started on rocephin. Patient also w/ creat 2.32. Patient taken to vascular lab and dvt US with no evidence of dvt and arterial duplex showing 75-99% stenosis in deep femoral artery and 50-74% stenosis in superficial femoral artery recommending vascular consult.   While in ED patient found became unresponsive w/ clinched teeth. Patient was pulseless and cpr started w/ rosc in 2 minutes. Attempted to intubated after code and very difficult airway and initially could not pass ETT. Patient coded during intubation procedure. 6.5 ETT was able to be placed and ROSC was achieved. Patient on levo and epi. Patient hr 50s. EKG showing complete AV block. Cards consulted and taking for emergent cath. PCCM consulted for icu admission.  Pertinent  Medical History   Past Medical History:  Diagnosis Date   Coronary artery disease    Diabetes mellitus without complication (HCC)    typ2   GERD (gastroesophageal reflux disease)    Hyperlipidemia    Hypertension    Osteomyelitis (HCC) 03/25/2017   RT FOOT     Significant Hospital Events: Including procedures, antibiotic start and stop dates in addition to other pertinent events   9/19 admitted initially w/ UTI;  while in ED pea arrest, difficult intubation. Complete heart block going to cath lab  Interim History / Subjective:  On 7 levo and 4 epi Intubated on mech vent  Objective   Blood pressure (!) 141/91, pulse 73, temperature 97.9 F (36.6 C), temperature source Oral, resp. rate (!) 24, SpO2 95%.       No intake or output data in the 24 hours ending 08/02/2023 1606 There were no vitals filed for this visit.  Examination: General:  critically ill appearing on mech vent HEENT: MM pink/moist; ETT in place Neuro: sedate recently received rsi meds; perrl CV: s1s2, brady 50s, no m/r/g PULM:  dim clear BS bilaterally; on mech vent PRVC GI: soft, bsx4 active  Extremities: warm/dry, L BKA; RLE swelling Skin: no rashes or lesions    Resolved Hospital Problem list     Assessment & Plan:   PEA arrest Complete heart block Cardiogenic shock Plan: -will admit to ICU w/ continuous telemetry monitoring -cards consulted; taking emergently to cath lab -avoid av nodal blocking agents -continue pressors for MAP goal >65 -trend troponin and lactate -increase RR and given bicarb; repeat ABG in few hours -check BMP, Mag: replete electrolytes as needed -echo -CT head and abd/pelvis pending -consider EEG -check cultures: bcx2, urine culture/UA -cont rocephin -TTM normothermia protocol in place  Acute respiratory failure w/ hypoxia and hypercapnia -difficult airway and 6.5 ett in place; appears to have had previous trach Plan: -increase RR; repeat abg in few hours -LTVV strategy with tidal volumes of 6-8 cc/kg ideal  NAME:  Antonio Reinisch., MRN:  [id number], DOB:  14-Jun-1956, LOS: 0 ADMISSION DATE:  08/11/2023, CONSULTATION DATE:  9/19 REFERRING MD:  Dr. Criss Alvine, CHIEF COMPLAINT:  cardiac arrest   History of Present Illness:  Patient is a 67 yo M w/ pertinent PMH of HTN, CAD s/p stent placement, PAD s/p L BKA and LSFA stenting 2013, T2DM, right foot osteomyelitis s/p right iliofemoral endarterectomy and right metatarsal amputation presents to [facility] ED on 9/19 w/ UTI.  Patient having UTI symptoms for the past 4-5 days. Also having b/l back pain and abd pain. Also having chronic right leg pain that has been progressively worsening over the last 4-5 months. On arrival patient alert and hypertensive 162/120. Afebrile and wbc 7.8. UA w/ leukocytes and uc pending. Started on rocephin. Patient also w/ creat 2.32. Patient taken to vascular lab and dvt US with no evidence of dvt and arterial duplex showing 75-99% stenosis in deep femoral artery and 50-74% stenosis in superficial femoral artery recommending vascular consult.   While in ED patient found became unresponsive w/ clinched teeth. Patient was pulseless and cpr started w/ rosc in 2 minutes. Attempted to intubated after code and very difficult airway and initially could not pass ETT. Patient coded during intubation procedure. 6.5 ETT was able to be placed and ROSC was achieved. Patient on levo and epi. Patient hr 50s. EKG showing complete AV block. Cards consulted and taking for emergent cath. PCCM consulted for icu admission.  Pertinent  Medical History   Past Medical History:  Diagnosis Date   Coronary artery disease    Diabetes mellitus without complication (HCC)    typ2   GERD (gastroesophageal reflux disease)    Hyperlipidemia    Hypertension    Osteomyelitis (HCC) 03/25/2017   RT FOOT     Significant Hospital Events: Including procedures, antibiotic start and stop dates in addition to other pertinent events   9/19 admitted initially w/ UTI;  while in ED pea arrest, difficult intubation. Complete heart block going to cath lab  Interim History / Subjective:  On 7 levo and 4 epi Intubated on mech vent  Objective   Blood pressure (!) 141/91, pulse 73, temperature 97.9 F (36.6 C), temperature source Oral, resp. rate (!) 24, SpO2 95%.       No intake or output data in the 24 hours ending 07/29/2023 1606 There were no vitals filed for this visit.  Examination: General:  critically ill appearing on mech vent HEENT: MM pink/moist; ETT in place Neuro: sedate recently received rsi meds; perrl CV: s1s2, brady 50s, no m/r/g PULM:  dim clear BS bilaterally; on mech vent PRVC GI: soft, bsx4 active  Extremities: warm/dry, L BKA; RLE swelling Skin: no rashes or lesions    Resolved Hospital Problem list     Assessment & Plan:   PEA arrest Complete heart block Cardiogenic shock Plan: -will admit to ICU w/ continuous telemetry monitoring -cards consulted; taking emergently to cath lab -avoid av nodal blocking agents -continue pressors for MAP goal >65 -trend troponin and lactate -increase RR and given bicarb; repeat ABG in few hours -check BMP, Mag: replete electrolytes as needed -echo -CT head and abd/pelvis pending -consider EEG -check cultures: bcx2, urine culture/UA -cont rocephin -TTM normothermia protocol in place  Acute respiratory failure w/ hypoxia and hypercapnia -difficult airway and 6.5 ett in place; appears to have had previous trach Plan: -increase RR; repeat abg in few hours -LTVV strategy with tidal volumes of 6-8 cc/kg ideal  NAME:  Antonio Reinisch., MRN:  [id number], DOB:  14-Jun-1956, LOS: 0 ADMISSION DATE:  08/11/2023, CONSULTATION DATE:  9/19 REFERRING MD:  Dr. Criss Alvine, CHIEF COMPLAINT:  cardiac arrest   History of Present Illness:  Patient is a 67 yo M w/ pertinent PMH of HTN, CAD s/p stent placement, PAD s/p L BKA and LSFA stenting 2013, T2DM, right foot osteomyelitis s/p right iliofemoral endarterectomy and right metatarsal amputation presents to [facility] ED on 9/19 w/ UTI.  Patient having UTI symptoms for the past 4-5 days. Also having b/l back pain and abd pain. Also having chronic right leg pain that has been progressively worsening over the last 4-5 months. On arrival patient alert and hypertensive 162/120. Afebrile and wbc 7.8. UA w/ leukocytes and uc pending. Started on rocephin. Patient also w/ creat 2.32. Patient taken to vascular lab and dvt US with no evidence of dvt and arterial duplex showing 75-99% stenosis in deep femoral artery and 50-74% stenosis in superficial femoral artery recommending vascular consult.   While in ED patient found became unresponsive w/ clinched teeth. Patient was pulseless and cpr started w/ rosc in 2 minutes. Attempted to intubated after code and very difficult airway and initially could not pass ETT. Patient coded during intubation procedure. 6.5 ETT was able to be placed and ROSC was achieved. Patient on levo and epi. Patient hr 50s. EKG showing complete AV block. Cards consulted and taking for emergent cath. PCCM consulted for icu admission.  Pertinent  Medical History   Past Medical History:  Diagnosis Date   Coronary artery disease    Diabetes mellitus without complication (HCC)    typ2   GERD (gastroesophageal reflux disease)    Hyperlipidemia    Hypertension    Osteomyelitis (HCC) 03/25/2017   RT FOOT     Significant Hospital Events: Including procedures, antibiotic start and stop dates in addition to other pertinent events   9/19 admitted initially w/ UTI;  while in ED pea arrest, difficult intubation. Complete heart block going to cath lab  Interim History / Subjective:  On 7 levo and 4 epi Intubated on mech vent  Objective   Blood pressure (!) 141/91, pulse 73, temperature 97.9 F (36.6 C), temperature source Oral, resp. rate (!) 24, SpO2 95%.       No intake or output data in the 24 hours ending 07/29/2023 1606 There were no vitals filed for this visit.  Examination: General:  critically ill appearing on mech vent HEENT: MM pink/moist; ETT in place Neuro: sedate recently received rsi meds; perrl CV: s1s2, brady 50s, no m/r/g PULM:  dim clear BS bilaterally; on mech vent PRVC GI: soft, bsx4 active  Extremities: warm/dry, L BKA; RLE swelling Skin: no rashes or lesions    Resolved Hospital Problem list     Assessment & Plan:   PEA arrest Complete heart block Cardiogenic shock Plan: -will admit to ICU w/ continuous telemetry monitoring -cards consulted; taking emergently to cath lab -avoid av nodal blocking agents -continue pressors for MAP goal >65 -trend troponin and lactate -increase RR and given bicarb; repeat ABG in few hours -check BMP, Mag: replete electrolytes as needed -echo -CT head and abd/pelvis pending -consider EEG -check cultures: bcx2, urine culture/UA -cont rocephin -TTM normothermia protocol in place  Acute respiratory failure w/ hypoxia and hypercapnia -difficult airway and 6.5 ett in place; appears to have had previous trach Plan: -increase RR; repeat abg in few hours -LTVV strategy with tidal volumes of 6-8 cc/kg ideal  NAME:  Antonio Reinisch., MRN:  [id number], DOB:  14-Jun-1956, LOS: 0 ADMISSION DATE:  08/11/2023, CONSULTATION DATE:  9/19 REFERRING MD:  Dr. Criss Alvine, CHIEF COMPLAINT:  cardiac arrest   History of Present Illness:  Patient is a 67 yo M w/ pertinent PMH of HTN, CAD s/p stent placement, PAD s/p L BKA and LSFA stenting 2013, T2DM, right foot osteomyelitis s/p right iliofemoral endarterectomy and right metatarsal amputation presents to [facility] ED on 9/19 w/ UTI.  Patient having UTI symptoms for the past 4-5 days. Also having b/l back pain and abd pain. Also having chronic right leg pain that has been progressively worsening over the last 4-5 months. On arrival patient alert and hypertensive 162/120. Afebrile and wbc 7.8. UA w/ leukocytes and uc pending. Started on rocephin. Patient also w/ creat 2.32. Patient taken to vascular lab and dvt US with no evidence of dvt and arterial duplex showing 75-99% stenosis in deep femoral artery and 50-74% stenosis in superficial femoral artery recommending vascular consult.   While in ED patient found became unresponsive w/ clinched teeth. Patient was pulseless and cpr started w/ rosc in 2 minutes. Attempted to intubated after code and very difficult airway and initially could not pass ETT. Patient coded during intubation procedure. 6.5 ETT was able to be placed and ROSC was achieved. Patient on levo and epi. Patient hr 50s. EKG showing complete AV block. Cards consulted and taking for emergent cath. PCCM consulted for icu admission.  Pertinent  Medical History   Past Medical History:  Diagnosis Date   Coronary artery disease    Diabetes mellitus without complication (HCC)    typ2   GERD (gastroesophageal reflux disease)    Hyperlipidemia    Hypertension    Osteomyelitis (HCC) 03/25/2017   RT FOOT     Significant Hospital Events: Including procedures, antibiotic start and stop dates in addition to other pertinent events   9/19 admitted initially w/ UTI;  while in ED pea arrest, difficult intubation. Complete heart block going to cath lab  Interim History / Subjective:  On 7 levo and 4 epi Intubated on mech vent  Objective   Blood pressure (!) 141/91, pulse 73, temperature 97.9 F (36.6 C), temperature source Oral, resp. rate (!) 24, SpO2 95%.       No intake or output data in the 24 hours ending 07/29/2023 1606 There were no vitals filed for this visit.  Examination: General:  critically ill appearing on mech vent HEENT: MM pink/moist; ETT in place Neuro: sedate recently received rsi meds; perrl CV: s1s2, brady 50s, no m/r/g PULM:  dim clear BS bilaterally; on mech vent PRVC GI: soft, bsx4 active  Extremities: warm/dry, L BKA; RLE swelling Skin: no rashes or lesions    Resolved Hospital Problem list     Assessment & Plan:   PEA arrest Complete heart block Cardiogenic shock Plan: -will admit to ICU w/ continuous telemetry monitoring -cards consulted; taking emergently to cath lab -avoid av nodal blocking agents -continue pressors for MAP goal >65 -trend troponin and lactate -increase RR and given bicarb; repeat ABG in few hours -check BMP, Mag: replete electrolytes as needed -echo -CT head and abd/pelvis pending -consider EEG -check cultures: bcx2, urine culture/UA -cont rocephin -TTM normothermia protocol in place  Acute respiratory failure w/ hypoxia and hypercapnia -difficult airway and 6.5 ett in place; appears to have had previous trach Plan: -increase RR; repeat abg in few hours -LTVV strategy with tidal volumes of 6-8 cc/kg ideal

## 2023-08-15 NOTE — ED Triage Notes (Signed)
Patient arrived via EMS from home today. Patient states that for the past few days he has been having painful urination with bilateral flank pain that radiates the the groin region and to the stomach. Patient does have a history of hypertension but states that he does not take his blood pressure medication. He is a double amputee. Patient is also a diabetic as well. Allergy to penicillin. States pain is a 7/10.

## 2023-08-15 NOTE — Code Documentation (Signed)
Pulses present  

## 2023-08-15 NOTE — Code Documentation (Addendum)
Compressions started.

## 2023-08-15 NOTE — ED Provider Notes (Signed)
FV DistalFull                                                             +---------+---------------+---------+-----------+----------+-------------------+ PFV      Full                                                             +---------+---------------+---------+-----------+----------+-------------------+ POP      Full           Yes      Yes                                      +---------+---------------+---------+-----------+----------+-------------------+ PTV                                                   Not well visualized  +---------+---------------+---------+-----------+----------+-------------------+ PERO                                                  Not well visualized +---------+---------------+---------+-----------+----------+-------------------+   Summary: RIGHT: - There is no evidence of deep vein thrombosis in the lower extremity. However, portions of this examination were limited- see technologist comments above.  - Right calf veins were technically difficult to visualized to exclude DVT.   *See table(s) above for measurements and observations. Electronically signed by Gerarda Fraction on 07/30/2023 at 4:17:32 PM.    Final    VAS [place] LOWER EXTREMITY ARTERIAL DUPLEX (7a-7p)  Result Date: 08/17/2023 LOWER EXTREMITY ARTERIAL DUPLEX STUDY Patient Name:  Antonio [name].  Date of Exam:   08/22/2023 Medical Rec #: [id number]               Accession #:    [id number] Date of Birth: [birth year]               Patient Gender: M Patient Age:   67 years Exam Location:  [facility] Procedure:      VAS [place] LOWER EXTREMITY ARTERIAL DUPLEX Referring Phys: Lorin Picket Gilford Lardizabal --------------------------------------------------------------------------------  Indications: Peripheral artery disease, and Patient presents with swelling and              severe pain in his right calf. History of known PVD. High Risk Factors: Hypertension, hyperlipidemia, Diabetes.  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                         RIGHT ILEOFEMORAL ENDARTERECTOMY 01/01/22. RIGHT GROIN                         DEBRIDEMENT 02/08/22. Current ABI:            NA Comparison  FV DistalFull                                                             +---------+---------------+---------+-----------+----------+-------------------+ PFV      Full                                                             +---------+---------------+---------+-----------+----------+-------------------+ POP      Full           Yes      Yes                                      +---------+---------------+---------+-----------+----------+-------------------+ PTV                                                   Not well visualized  +---------+---------------+---------+-----------+----------+-------------------+ PERO                                                  Not well visualized +---------+---------------+---------+-----------+----------+-------------------+   Summary: RIGHT: - There is no evidence of deep vein thrombosis in the lower extremity. However, portions of this examination were limited- see technologist comments above.  - Right calf veins were technically difficult to visualized to exclude DVT.   *See table(s) above for measurements and observations. Electronically signed by Gerarda Fraction on 07/30/2023 at 4:17:32 PM.    Final    VAS [place] LOWER EXTREMITY ARTERIAL DUPLEX (7a-7p)  Result Date: 08/17/2023 LOWER EXTREMITY ARTERIAL DUPLEX STUDY Patient Name:  Antonio [name].  Date of Exam:   08/22/2023 Medical Rec #: [id number]               Accession #:    [id number] Date of Birth: [birth year]               Patient Gender: M Patient Age:   67 years Exam Location:  [facility] Procedure:      VAS [place] LOWER EXTREMITY ARTERIAL DUPLEX Referring Phys: Lorin Picket Gilford Lardizabal --------------------------------------------------------------------------------  Indications: Peripheral artery disease, and Patient presents with swelling and              severe pain in his right calf. History of known PVD. High Risk Factors: Hypertension, hyperlipidemia, Diabetes.  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                         RIGHT ILEOFEMORAL ENDARTERECTOMY 01/01/22. RIGHT GROIN                         DEBRIDEMENT 02/08/22. Current ABI:            NA Comparison  FV DistalFull                                                             +---------+---------------+---------+-----------+----------+-------------------+ PFV      Full                                                             +---------+---------------+---------+-----------+----------+-------------------+ POP      Full           Yes      Yes                                      +---------+---------------+---------+-----------+----------+-------------------+ PTV                                                   Not well visualized  +---------+---------------+---------+-----------+----------+-------------------+ PERO                                                  Not well visualized +---------+---------------+---------+-----------+----------+-------------------+   Summary: RIGHT: - There is no evidence of deep vein thrombosis in the lower extremity. However, portions of this examination were limited- see technologist comments above.  - Right calf veins were technically difficult to visualized to exclude DVT.   *See table(s) above for measurements and observations. Electronically signed by Gerarda Fraction on 07/29/2023 at 4:17:32 PM.    Final    VAS [place] LOWER EXTREMITY ARTERIAL DUPLEX (7a-7p)  Result Date: 08/01/2023 LOWER EXTREMITY ARTERIAL DUPLEX STUDY Patient Name:  Antonio [name].  Date of Exam:   08/12/2023 Medical Rec #: [id number]               Accession #:    [id number] Date of Birth: [birth year]               Patient Gender: M Patient Age:   67 years Exam Location:  [facility] Procedure:      VAS [place] LOWER EXTREMITY ARTERIAL DUPLEX Referring Phys: Lorin Picket Gilford Lardizabal --------------------------------------------------------------------------------  Indications: Peripheral artery disease, and Patient presents with swelling and              severe pain in his right calf. History of known PVD. High Risk Factors: Hypertension, hyperlipidemia, Diabetes.  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                         RIGHT ILEOFEMORAL ENDARTERECTOMY 01/01/22. RIGHT GROIN                         DEBRIDEMENT 02/08/22. Current ABI:            NA Comparison  FV DistalFull                                                             +---------+---------------+---------+-----------+----------+-------------------+ PFV      Full                                                             +---------+---------------+---------+-----------+----------+-------------------+ POP      Full           Yes      Yes                                      +---------+---------------+---------+-----------+----------+-------------------+ PTV                                                   Not well visualized  +---------+---------------+---------+-----------+----------+-------------------+ PERO                                                  Not well visualized +---------+---------------+---------+-----------+----------+-------------------+   Summary: RIGHT: - There is no evidence of deep vein thrombosis in the lower extremity. However, portions of this examination were limited- see technologist comments above.  - Right calf veins were technically difficult to visualized to exclude DVT.   *See table(s) above for measurements and observations. Electronically signed by Gerarda Fraction on 07/29/2023 at 4:17:32 PM.    Final    VAS [place] LOWER EXTREMITY ARTERIAL DUPLEX (7a-7p)  Result Date: 08/01/2023 LOWER EXTREMITY ARTERIAL DUPLEX STUDY Patient Name:  Antonio [name].  Date of Exam:   08/12/2023 Medical Rec #: [id number]               Accession #:    [id number] Date of Birth: [birth year]               Patient Gender: M Patient Age:   67 years Exam Location:  [facility] Procedure:      VAS [place] LOWER EXTREMITY ARTERIAL DUPLEX Referring Phys: Lorin Picket Gilford Lardizabal --------------------------------------------------------------------------------  Indications: Peripheral artery disease, and Patient presents with swelling and              severe pain in his right calf. History of known PVD. High Risk Factors: Hypertension, hyperlipidemia, Diabetes.  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                         RIGHT ILEOFEMORAL ENDARTERECTOMY 01/01/22. RIGHT GROIN                         DEBRIDEMENT 02/08/22. Current ABI:            NA Comparison  FV DistalFull                                                             +---------+---------------+---------+-----------+----------+-------------------+ PFV      Full                                                             +---------+---------------+---------+-----------+----------+-------------------+ POP      Full           Yes      Yes                                      +---------+---------------+---------+-----------+----------+-------------------+ PTV                                                   Not well visualized  +---------+---------------+---------+-----------+----------+-------------------+ PERO                                                  Not well visualized +---------+---------------+---------+-----------+----------+-------------------+   Summary: RIGHT: - There is no evidence of deep vein thrombosis in the lower extremity. However, portions of this examination were limited- see technologist comments above.  - Right calf veins were technically difficult to visualized to exclude DVT.   *See table(s) above for measurements and observations. Electronically signed by Gerarda Fraction on 07/29/2023 at 4:17:32 PM.    Final    VAS [place] LOWER EXTREMITY ARTERIAL DUPLEX (7a-7p)  Result Date: 08/01/2023 LOWER EXTREMITY ARTERIAL DUPLEX STUDY Patient Name:  Antonio [name].  Date of Exam:   08/12/2023 Medical Rec #: [id number]               Accession #:    [id number] Date of Birth: [birth year]               Patient Gender: M Patient Age:   67 years Exam Location:  [facility] Procedure:      VAS [place] LOWER EXTREMITY ARTERIAL DUPLEX Referring Phys: Lorin Picket Gilford Lardizabal --------------------------------------------------------------------------------  Indications: Peripheral artery disease, and Patient presents with swelling and              severe pain in his right calf. History of known PVD. High Risk Factors: Hypertension, hyperlipidemia, Diabetes.  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                         RIGHT ILEOFEMORAL ENDARTERECTOMY 01/01/22. RIGHT GROIN                         DEBRIDEMENT 02/08/22. Current ABI:            NA Comparison  FV DistalFull                                                             +---------+---------------+---------+-----------+----------+-------------------+ PFV      Full                                                             +---------+---------------+---------+-----------+----------+-------------------+ POP      Full           Yes      Yes                                      +---------+---------------+---------+-----------+----------+-------------------+ PTV                                                   Not well visualized  +---------+---------------+---------+-----------+----------+-------------------+ PERO                                                  Not well visualized +---------+---------------+---------+-----------+----------+-------------------+   Summary: RIGHT: - There is no evidence of deep vein thrombosis in the lower extremity. However, portions of this examination were limited- see technologist comments above.  - Right calf veins were technically difficult to visualized to exclude DVT.   *See table(s) above for measurements and observations. Electronically signed by Gerarda Fraction on 07/30/2023 at 4:17:32 PM.    Final    VAS [place] LOWER EXTREMITY ARTERIAL DUPLEX (7a-7p)  Result Date: 08/17/2023 LOWER EXTREMITY ARTERIAL DUPLEX STUDY Patient Name:  Antonio [name].  Date of Exam:   08/22/2023 Medical Rec #: [id number]               Accession #:    [id number] Date of Birth: [birth year]               Patient Gender: M Patient Age:   67 years Exam Location:  [facility] Procedure:      VAS [place] LOWER EXTREMITY ARTERIAL DUPLEX Referring Phys: Lorin Picket Gilford Lardizabal --------------------------------------------------------------------------------  Indications: Peripheral artery disease, and Patient presents with swelling and              severe pain in his right calf. History of known PVD. High Risk Factors: Hypertension, hyperlipidemia, Diabetes.  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                         RIGHT ILEOFEMORAL ENDARTERECTOMY 01/01/22. RIGHT GROIN                         DEBRIDEMENT 02/08/22. Current ABI:            NA Comparison  FV DistalFull                                                             +---------+---------------+---------+-----------+----------+-------------------+ PFV      Full                                                             +---------+---------------+---------+-----------+----------+-------------------+ POP      Full           Yes      Yes                                      +---------+---------------+---------+-----------+----------+-------------------+ PTV                                                   Not well visualized  +---------+---------------+---------+-----------+----------+-------------------+ PERO                                                  Not well visualized +---------+---------------+---------+-----------+----------+-------------------+   Summary: RIGHT: - There is no evidence of deep vein thrombosis in the lower extremity. However, portions of this examination were limited- see technologist comments above.  - Right calf veins were technically difficult to visualized to exclude DVT.   *See table(s) above for measurements and observations. Electronically signed by Gerarda Fraction on 07/29/2023 at 4:17:32 PM.    Final    VAS [place] LOWER EXTREMITY ARTERIAL DUPLEX (7a-7p)  Result Date: 08/01/2023 LOWER EXTREMITY ARTERIAL DUPLEX STUDY Patient Name:  Antonio [name].  Date of Exam:   08/12/2023 Medical Rec #: [id number]               Accession #:    [id number] Date of Birth: [birth year]               Patient Gender: M Patient Age:   67 years Exam Location:  [facility] Procedure:      VAS [place] LOWER EXTREMITY ARTERIAL DUPLEX Referring Phys: Lorin Picket Gilford Lardizabal --------------------------------------------------------------------------------  Indications: Peripheral artery disease, and Patient presents with swelling and              severe pain in his right calf. History of known PVD. High Risk Factors: Hypertension, hyperlipidemia, Diabetes.  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                         RIGHT ILEOFEMORAL ENDARTERECTOMY 01/01/22. RIGHT GROIN                         DEBRIDEMENT 02/08/22. Current ABI:            NA Comparison

## 2023-08-15 NOTE — Code Documentation (Signed)
Absent pulses. Compressions started.

## 2023-08-15 NOTE — Progress Notes (Signed)
Right lower ext venous  has been completed. Refer to [facility] under chart review to view preliminary results.   08/05/2023  2:52 PM Sotero Brinkmeyer, Gerarda Gunther

## 2023-08-15 NOTE — Progress Notes (Signed)
RT transported pt from 2H13 to CT-2 and back with RN at bedside and transport. No complications at this time.

## 2023-08-15 NOTE — Code Documentation (Signed)
Defib @200 

## 2023-08-15 NOTE — Consult Note (Signed)
PFV      Full                                                             +---------+---------------+---------+-----------+----------+-------------------+ POP      Full           Yes      Yes                                      +---------+---------------+---------+-----------+----------+-------------------+ PTV                                                   Not well visualized +---------+---------------+---------+-----------+----------+-------------------+ PERO                                                  Not well visualized +---------+---------------+---------+-----------+----------+-------------------+   Summary: RIGHT: - There is no evidence of deep vein thrombosis in the lower extremity. However, portions of this examination were limited-  see technologist comments above.  - Right calf veins were technically difficult to visualized to exclude DVT.   *See table(s) above for measurements and observations. Electronically signed by Gerarda Fraction on 08/14/2023 at 4:17:32 PM.    Final    VAS [place] LOWER EXTREMITY ARTERIAL DUPLEX (7a-7p)  Result Date: 08/16/2023 LOWER EXTREMITY ARTERIAL DUPLEX STUDY Patient Name:  Antonio [name].  Date of Exam:   08/12/2023 Medical Rec #: [id number]               Accession #:    [id number] Date of Birth: [birth year]               Patient Gender: M Patient Age:   67 years Exam Location:  [facility] Procedure:      VAS [place] LOWER EXTREMITY ARTERIAL DUPLEX Referring Phys: Lorin Picket [place] --------------------------------------------------------------------------------  Indications: Peripheral artery disease, and Patient presents with swelling and              severe pain in his right calf. History of known PVD. High Risk Factors: Hypertension, hyperlipidemia, Diabetes.  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                         RIGHT ILEOFEMORAL ENDARTERECTOMY 01/01/22. RIGHT GROIN                         DEBRIDEMENT 02/08/22. Current ABI:            NA Comparison       12/29/21 - Lower Extremity Angiography was performed. No prior Study:           LEAD. Performing Technologist: Marilynne Halsted RDMS, RVT  Examination Guidelines: A complete evaluation includes B-mode imaging, spectral Doppler, color Doppler, and power Doppler as needed of all accessible portions of each vessel. Bilateral testing is considered an integral part of a complete examination.  17.0 g/dL   HCT 34.7 42.5 - 95.6 %  I-Stat Lactic Acid     Status: Abnormal   Collection Time: 08/09/2023  3:36 PM  Result Value Ref Range   Lactic Acid, Venous 6.9 (HH) 0.5 - 1.9 mmol/L   Comment NOTIFIED PHYSICIAN     ECG   Complete heart block with wide QRS at 42- Personally Reviewed  Telemetry   Complete heart block with ventricular escape- Personally Reviewed  Radiology   VAS [place] LOWER EXTREMITY VENOUS (DVT) (7a-7p)  Result Date: 08/18/2023  Lower Venous DVT Study Patient Name:  Antonio [name].  Date of Exam:   08/26/2023 Medical Rec #: [id number]               Accession #:    [id number] Date of Birth: [birth year]               Patient Gender: M Patient Age:   67 years Exam Location:  [facility] Procedure:      VAS [place] LOWER EXTREMITY VENOUS (DVT) Referring Phys: Lorin Picket [place] --------------------------------------------------------------------------------  Indications: Pain and swelling in right calf for several months. History of severe PVD.  Risk Factors: Surgery left AKA. Performing Technologist: Marilynne Halsted RDMS, RVT  Examination Guidelines: A complete evaluation includes B-mode imaging, spectral Doppler, color Doppler, and power Doppler as needed of all accessible portions of each vessel. Bilateral testing is considered an integral part of a complete examination. Limited examinations for reoccurring indications may be  performed as noted. The reflux portion of the exam is performed with the patient in reverse Trendelenburg.  +---------+---------------+---------+-----------+----------+-------------------+ RIGHT    CompressibilityPhasicitySpontaneityPropertiesThrombus Aging      +---------+---------------+---------+-----------+----------+-------------------+ CFV      Full           Yes      Yes                                      +---------+---------------+---------+-----------+----------+-------------------+ SFJ      Full                                                             +---------+---------------+---------+-----------+----------+-------------------+ FV Prox  Full                                                             +---------+---------------+---------+-----------+----------+-------------------+ FV Mid   Full                                                             +---------+---------------+---------+-----------+----------+-------------------+ FV DistalFull                                                             +---------+---------------+---------+-----------+----------+-------------------+  17.0 g/dL   HCT 34.7 42.5 - 95.6 %  I-Stat Lactic Acid     Status: Abnormal   Collection Time: 08/05/2023  3:36 PM  Result Value Ref Range   Lactic Acid, Venous 6.9 (HH) 0.5 - 1.9 mmol/L   Comment NOTIFIED PHYSICIAN     ECG   Complete heart block with wide QRS at 42- Personally Reviewed  Telemetry   Complete heart block with ventricular escape- Personally Reviewed  Radiology   VAS [place] LOWER EXTREMITY VENOUS (DVT) (7a-7p)  Result Date: 08/04/2023  Lower Venous DVT Study Patient Name:  Antonio [name].  Date of Exam:   08/14/2023 Medical Rec #: [id number]               Accession #:    [id number] Date of Birth: [birth year]               Patient Gender: M Patient Age:   67 years Exam Location:  [facility] Procedure:      VAS [place] LOWER EXTREMITY VENOUS (DVT) Referring Phys: Lorin Picket [place] --------------------------------------------------------------------------------  Indications: Pain and swelling in right calf for several months. History of severe PVD.  Risk Factors: Surgery left AKA. Performing Technologist: Marilynne Halsted RDMS, RVT  Examination Guidelines: A complete evaluation includes B-mode imaging, spectral Doppler, color Doppler, and power Doppler as needed of all accessible portions of each vessel. Bilateral testing is considered an integral part of a complete examination. Limited examinations for reoccurring indications may be  performed as noted. The reflux portion of the exam is performed with the patient in reverse Trendelenburg.  +---------+---------------+---------+-----------+----------+-------------------+ RIGHT    CompressibilityPhasicitySpontaneityPropertiesThrombus Aging      +---------+---------------+---------+-----------+----------+-------------------+ CFV      Full           Yes      Yes                                      +---------+---------------+---------+-----------+----------+-------------------+ SFJ      Full                                                             +---------+---------------+---------+-----------+----------+-------------------+ FV Prox  Full                                                             +---------+---------------+---------+-----------+----------+-------------------+ FV Mid   Full                                                             +---------+---------------+---------+-----------+----------+-------------------+ FV DistalFull                                                             +---------+---------------+---------+-----------+----------+-------------------+  17.0 g/dL   HCT 34.7 42.5 - 95.6 %  I-Stat Lactic Acid     Status: Abnormal   Collection Time: 08/05/2023  3:36 PM  Result Value Ref Range   Lactic Acid, Venous 6.9 (HH) 0.5 - 1.9 mmol/L   Comment NOTIFIED PHYSICIAN     ECG   Complete heart block with wide QRS at 42- Personally Reviewed  Telemetry   Complete heart block with ventricular escape- Personally Reviewed  Radiology   VAS [place] LOWER EXTREMITY VENOUS (DVT) (7a-7p)  Result Date: 08/04/2023  Lower Venous DVT Study Patient Name:  Antonio [name].  Date of Exam:   08/14/2023 Medical Rec #: [id number]               Accession #:    [id number] Date of Birth: [birth year]               Patient Gender: M Patient Age:   67 years Exam Location:  [facility] Procedure:      VAS [place] LOWER EXTREMITY VENOUS (DVT) Referring Phys: Lorin Picket [place] --------------------------------------------------------------------------------  Indications: Pain and swelling in right calf for several months. History of severe PVD.  Risk Factors: Surgery left AKA. Performing Technologist: Marilynne Halsted RDMS, RVT  Examination Guidelines: A complete evaluation includes B-mode imaging, spectral Doppler, color Doppler, and power Doppler as needed of all accessible portions of each vessel. Bilateral testing is considered an integral part of a complete examination. Limited examinations for reoccurring indications may be  performed as noted. The reflux portion of the exam is performed with the patient in reverse Trendelenburg.  +---------+---------------+---------+-----------+----------+-------------------+ RIGHT    CompressibilityPhasicitySpontaneityPropertiesThrombus Aging      +---------+---------------+---------+-----------+----------+-------------------+ CFV      Full           Yes      Yes                                      +---------+---------------+---------+-----------+----------+-------------------+ SFJ      Full                                                             +---------+---------------+---------+-----------+----------+-------------------+ FV Prox  Full                                                             +---------+---------------+---------+-----------+----------+-------------------+ FV Mid   Full                                                             +---------+---------------+---------+-----------+----------+-------------------+ FV DistalFull                                                             +---------+---------------+---------+-----------+----------+-------------------+  17.0 g/dL   HCT 34.7 42.5 - 95.6 %  I-Stat Lactic Acid     Status: Abnormal   Collection Time: 08/05/2023  3:36 PM  Result Value Ref Range   Lactic Acid, Venous 6.9 (HH) 0.5 - 1.9 mmol/L   Comment NOTIFIED PHYSICIAN     ECG   Complete heart block with wide QRS at 42- Personally Reviewed  Telemetry   Complete heart block with ventricular escape- Personally Reviewed  Radiology   VAS [place] LOWER EXTREMITY VENOUS (DVT) (7a-7p)  Result Date: 08/04/2023  Lower Venous DVT Study Patient Name:  Antonio [name].  Date of Exam:   08/14/2023 Medical Rec #: [id number]               Accession #:    [id number] Date of Birth: [birth year]               Patient Gender: M Patient Age:   67 years Exam Location:  [facility] Procedure:      VAS [place] LOWER EXTREMITY VENOUS (DVT) Referring Phys: Lorin Picket [place] --------------------------------------------------------------------------------  Indications: Pain and swelling in right calf for several months. History of severe PVD.  Risk Factors: Surgery left AKA. Performing Technologist: Marilynne Halsted RDMS, RVT  Examination Guidelines: A complete evaluation includes B-mode imaging, spectral Doppler, color Doppler, and power Doppler as needed of all accessible portions of each vessel. Bilateral testing is considered an integral part of a complete examination. Limited examinations for reoccurring indications may be  performed as noted. The reflux portion of the exam is performed with the patient in reverse Trendelenburg.  +---------+---------------+---------+-----------+----------+-------------------+ RIGHT    CompressibilityPhasicitySpontaneityPropertiesThrombus Aging      +---------+---------------+---------+-----------+----------+-------------------+ CFV      Full           Yes      Yes                                      +---------+---------------+---------+-----------+----------+-------------------+ SFJ      Full                                                             +---------+---------------+---------+-----------+----------+-------------------+ FV Prox  Full                                                             +---------+---------------+---------+-----------+----------+-------------------+ FV Mid   Full                                                             +---------+---------------+---------+-----------+----------+-------------------+ FV DistalFull                                                             +---------+---------------+---------+-----------+----------+-------------------+  17.0 g/dL   HCT 34.7 42.5 - 95.6 %  I-Stat Lactic Acid     Status: Abnormal   Collection Time: 08/09/2023  3:36 PM  Result Value Ref Range   Lactic Acid, Venous 6.9 (HH) 0.5 - 1.9 mmol/L   Comment NOTIFIED PHYSICIAN     ECG   Complete heart block with wide QRS at 42- Personally Reviewed  Telemetry   Complete heart block with ventricular escape- Personally Reviewed  Radiology   VAS [place] LOWER EXTREMITY VENOUS (DVT) (7a-7p)  Result Date: 08/18/2023  Lower Venous DVT Study Patient Name:  Antonio [name].  Date of Exam:   08/26/2023 Medical Rec #: [id number]               Accession #:    [id number] Date of Birth: [birth year]               Patient Gender: M Patient Age:   67 years Exam Location:  [facility] Procedure:      VAS [place] LOWER EXTREMITY VENOUS (DVT) Referring Phys: Lorin Picket [place] --------------------------------------------------------------------------------  Indications: Pain and swelling in right calf for several months. History of severe PVD.  Risk Factors: Surgery left AKA. Performing Technologist: Marilynne Halsted RDMS, RVT  Examination Guidelines: A complete evaluation includes B-mode imaging, spectral Doppler, color Doppler, and power Doppler as needed of all accessible portions of each vessel. Bilateral testing is considered an integral part of a complete examination. Limited examinations for reoccurring indications may be  performed as noted. The reflux portion of the exam is performed with the patient in reverse Trendelenburg.  +---------+---------------+---------+-----------+----------+-------------------+ RIGHT    CompressibilityPhasicitySpontaneityPropertiesThrombus Aging      +---------+---------------+---------+-----------+----------+-------------------+ CFV      Full           Yes      Yes                                      +---------+---------------+---------+-----------+----------+-------------------+ SFJ      Full                                                             +---------+---------------+---------+-----------+----------+-------------------+ FV Prox  Full                                                             +---------+---------------+---------+-----------+----------+-------------------+ FV Mid   Full                                                             +---------+---------------+---------+-----------+----------+-------------------+ FV DistalFull                                                             +---------+---------------+---------+-----------+----------+-------------------+  17.0 g/dL   HCT 34.7 42.5 - 95.6 %  I-Stat Lactic Acid     Status: Abnormal   Collection Time: 08/09/2023  3:36 PM  Result Value Ref Range   Lactic Acid, Venous 6.9 (HH) 0.5 - 1.9 mmol/L   Comment NOTIFIED PHYSICIAN     ECG   Complete heart block with wide QRS at 42- Personally Reviewed  Telemetry   Complete heart block with ventricular escape- Personally Reviewed  Radiology   VAS [place] LOWER EXTREMITY VENOUS (DVT) (7a-7p)  Result Date: 08/18/2023  Lower Venous DVT Study Patient Name:  Antonio [name].  Date of Exam:   08/26/2023 Medical Rec #: [id number]               Accession #:    [id number] Date of Birth: [birth year]               Patient Gender: M Patient Age:   67 years Exam Location:  [facility] Procedure:      VAS [place] LOWER EXTREMITY VENOUS (DVT) Referring Phys: Lorin Picket [place] --------------------------------------------------------------------------------  Indications: Pain and swelling in right calf for several months. History of severe PVD.  Risk Factors: Surgery left AKA. Performing Technologist: Marilynne Halsted RDMS, RVT  Examination Guidelines: A complete evaluation includes B-mode imaging, spectral Doppler, color Doppler, and power Doppler as needed of all accessible portions of each vessel. Bilateral testing is considered an integral part of a complete examination. Limited examinations for reoccurring indications may be  performed as noted. The reflux portion of the exam is performed with the patient in reverse Trendelenburg.  +---------+---------------+---------+-----------+----------+-------------------+ RIGHT    CompressibilityPhasicitySpontaneityPropertiesThrombus Aging      +---------+---------------+---------+-----------+----------+-------------------+ CFV      Full           Yes      Yes                                      +---------+---------------+---------+-----------+----------+-------------------+ SFJ      Full                                                             +---------+---------------+---------+-----------+----------+-------------------+ FV Prox  Full                                                             +---------+---------------+---------+-----------+----------+-------------------+ FV Mid   Full                                                             +---------+---------------+---------+-----------+----------+-------------------+ FV DistalFull                                                             +---------+---------------+---------+-----------+----------+-------------------+  17.0 g/dL   HCT 34.7 42.5 - 95.6 %  I-Stat Lactic Acid     Status: Abnormal   Collection Time: 08/09/2023  3:36 PM  Result Value Ref Range   Lactic Acid, Venous 6.9 (HH) 0.5 - 1.9 mmol/L   Comment NOTIFIED PHYSICIAN     ECG   Complete heart block with wide QRS at 42- Personally Reviewed  Telemetry   Complete heart block with ventricular escape- Personally Reviewed  Radiology   VAS [place] LOWER EXTREMITY VENOUS (DVT) (7a-7p)  Result Date: 08/18/2023  Lower Venous DVT Study Patient Name:  Antonio [name].  Date of Exam:   08/26/2023 Medical Rec #: [id number]               Accession #:    [id number] Date of Birth: [birth year]               Patient Gender: M Patient Age:   67 years Exam Location:  [facility] Procedure:      VAS [place] LOWER EXTREMITY VENOUS (DVT) Referring Phys: Lorin Picket [place] --------------------------------------------------------------------------------  Indications: Pain and swelling in right calf for several months. History of severe PVD.  Risk Factors: Surgery left AKA. Performing Technologist: Marilynne Halsted RDMS, RVT  Examination Guidelines: A complete evaluation includes B-mode imaging, spectral Doppler, color Doppler, and power Doppler as needed of all accessible portions of each vessel. Bilateral testing is considered an integral part of a complete examination. Limited examinations for reoccurring indications may be  performed as noted. The reflux portion of the exam is performed with the patient in reverse Trendelenburg.  +---------+---------------+---------+-----------+----------+-------------------+ RIGHT    CompressibilityPhasicitySpontaneityPropertiesThrombus Aging      +---------+---------------+---------+-----------+----------+-------------------+ CFV      Full           Yes      Yes                                      +---------+---------------+---------+-----------+----------+-------------------+ SFJ      Full                                                             +---------+---------------+---------+-----------+----------+-------------------+ FV Prox  Full                                                             +---------+---------------+---------+-----------+----------+-------------------+ FV Mid   Full                                                             +---------+---------------+---------+-----------+----------+-------------------+ FV DistalFull                                                             +---------+---------------+---------+-----------+----------+-------------------+

## 2023-08-15 NOTE — Progress Notes (Signed)
Transported pt from ED03 to CathLab with bedside RN. Vitals are stable.

## 2023-08-15 NOTE — Progress Notes (Addendum)
eLink Physician-Brief Progress Note Patient Name: Antonio [name]. DOB: [birth year] MRN: [id number]   Date of Service  08/13/2023  HPI/Events of Note  Radiology called report of head CT to me.  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  Nothing acute related to his cardiac arrest.  eICU Interventions  Will continue to monitor.  He might need an MRI to further evaluate the acuity of the infarct.  5:10 AM A.m. labs with markedly elevated troponin and hypokalemia.  Cardiology on the case and will follow-up on troponin with them.  Medically manage hyperkalemia.  Orders placed.   Intervention Category Evaluation Type: Other  [name] 08/17/2023, 11:46 PM

## 2023-08-15 NOTE — ED Notes (Signed)
Pt agreeing to triage vitals but requesting to not have EKG leads, BP, or pulse ox on. Pt educated on the risks of not having these monitoring devices connected while in the ER.

## 2023-08-15 NOTE — Progress Notes (Signed)
eLink Physician-Brief Progress Note Patient Name: Antonio [name]. DOB: [birth year] MRN: [id number]   Date of Service  08/05/2023  HPI/Events of Note  67 year old male who presented with what he thought was a UTI.  Became unresponsive and had a cardiac arrest in the ED.  He was successfully resuscitated.  He is status post temporary pacemaker placement by cardiology.  Family requests to speak to me.  eICU Interventions  Presents reviewed.  Labs and pertinent imaging reviewed.  Spoke to patient's son who states that patient has self extubated alarm of times to the point of requiring a trach following illness and intubation. Video assessment done.  Intubated and sedated. Wrist restraints ordered to help prevent self extubation.  OG tube has been placed and a KUB has been ordered for it. Bedside nurse was present for entire interaction.     Intervention Category Minor Interventions: Communication with other healthcare providers and/or family;Other:  Antonio [name] 08/20/2023, 9:08 PM

## 2023-08-15 NOTE — Code Documentation (Signed)
Compressions started.

## 2023-08-15 NOTE — Code Documentation (Signed)
2g Magnesium @1523  given by April, RN

## 2023-08-15 NOTE — ED Notes (Signed)
Left pts room at this time to get pts pain medication that he was requesting. Upon returning to the room pt reports "I don't feel right." Pt starring to the right with fixed gaze but when calling pts name he is stating "something ain't right." Pt laid head back, diaphoretic, clinched teeth, and foaming at the mouth. Notified MD and turned pt to his side, attempted jaw thrust, placed non rebreathe on pt.

## 2023-08-15 NOTE — Code Documentation (Signed)
Pulses present

## 2023-08-15 NOTE — Progress Notes (Signed)
Pt transported from Cath Lab to Holding bay 19 on vent w/o any complications.

## 2023-08-15 NOTE — ED Notes (Signed)
All I-stat results shown to Dr. Criss Alvine.

## 2023-08-15 NOTE — Code Documentation (Addendum)
Chest compressions initiated. Bagging pt.

## 2023-08-15 NOTE — ED Notes (Signed)
Pt transported to vascular.

## 2023-08-16 ENCOUNTER — Inpatient Hospital Stay (HOSPITAL_COMMUNITY): Payer: Medicare Other

## 2023-08-16 DIAGNOSIS — I214 Non-ST elevation (NSTEMI) myocardial infarction: Secondary | ICD-10-CM | POA: Diagnosis not present

## 2023-08-16 DIAGNOSIS — I5021 Acute systolic (congestive) heart failure: Secondary | ICD-10-CM | POA: Diagnosis not present

## 2023-08-16 DIAGNOSIS — I469 Cardiac arrest, cause unspecified: Secondary | ICD-10-CM | POA: Diagnosis not present

## 2023-08-16 DIAGNOSIS — I442 Atrioventricular block, complete: Secondary | ICD-10-CM | POA: Diagnosis not present

## 2023-08-16 DIAGNOSIS — R569 Unspecified convulsions: Secondary | ICD-10-CM

## 2023-08-16 LAB — POTASSIUM
Potassium: 5.7 mmol/L — ABNORMAL HIGH (ref 3.5–5.1)
Potassium: 5.7 mmol/L — ABNORMAL HIGH (ref 3.5–5.1)
Potassium: 5.9 mmol/L — ABNORMAL HIGH (ref 3.5–5.1)
Potassium: 5.9 mmol/L — ABNORMAL HIGH (ref 3.5–5.1)
Potassium: 5.8 mmol/L — ABNORMAL HIGH (ref 3.5–5.1)

## 2023-08-16 LAB — POCT I-STAT 7, (LYTES, BLD GAS, ICA,H+H)
Acid-base deficit: 1 mmol/L (ref 0.0–2.0)
Bicarbonate: 23.4 mmol/L (ref 20.0–28.0)
Calcium, Ion: 1.17 mmol/L (ref 1.15–1.40)
HCT: 42 % (ref 39.0–52.0)
Hemoglobin: 14.3 g/dL (ref 13.0–17.0)
O2 Saturation: 95 %
Patient temperature: 36.2
Potassium: 6 mmol/L — ABNORMAL HIGH (ref 3.5–5.1)
Sodium: 139 mmol/L (ref 135–145)
TCO2: 24 mmol/L (ref 22–32)
pCO2 arterial: 35.1 mmHg (ref 32–48)
pH, Arterial: 7.429 (ref 7.35–7.45)
pO2, Arterial: 70 mmHg — ABNORMAL LOW (ref 83–108)

## 2023-08-16 LAB — ECHOCARDIOGRAM COMPLETE
AR max vel: 2.04 cm2
AV Area VTI: 1.8 cm2
AV Area mean vel: 1.9 cm2
AV Mean grad: 3 mmHg
AV Peak grad: 5.1 mmHg
Ao pk vel: 1.13 m/s
Est EF: <20
Height: 72 in
S' Lateral: 4.6 cm
Weight: 3629.65 oz

## 2023-08-16 LAB — URINALYSIS, W/ REFLEX TO CULTURE (INFECTION SUSPECTED)
Bacteria, UA: NONE SEEN
Bilirubin Urine: NEGATIVE
Glucose, UA: 150 mg/dL — AB
Ketones, ur: NEGATIVE mg/dL
Nitrite: NEGATIVE
Protein, ur: >=300 mg/dL — AB
Specific Gravity, Urine: 1.017 (ref 1.005–1.030)
WBC, UA: >50 WBC/hpf (ref 0–5)
pH: 6 (ref 5.0–8.0)

## 2023-08-16 LAB — BASIC METABOLIC PANEL
Anion gap: 13 (ref 5–15)
Anion gap: 12 (ref 5–15)
BUN: 41 mg/dL — ABNORMAL HIGH (ref 8–23)
BUN: 54 mg/dL — ABNORMAL HIGH (ref 8–23)
CO2: 22 mmol/L (ref 22–32)
CO2: 20 mmol/L — ABNORMAL LOW (ref 22–32)
Calcium: 8.5 mg/dL — ABNORMAL LOW (ref 8.9–10.3)
Calcium: 7.7 mg/dL — ABNORMAL LOW (ref 8.9–10.3)
Chloride: 106 mmol/L (ref 98–111)
Chloride: 108 mmol/L (ref 98–111)
Creatinine, Ser: 3.07 mg/dL — ABNORMAL HIGH (ref 0.61–1.24)
Creatinine, Ser: 4.49 mg/dL — ABNORMAL HIGH (ref 0.61–1.24)
GFR, Estimated: 22 mL/min — ABNORMAL LOW (ref >60)
GFR, Estimated: 14 mL/min — ABNORMAL LOW (ref >60)
Glucose, Bld: 146 mg/dL — ABNORMAL HIGH (ref 70–99)
Glucose, Bld: 272 mg/dL — ABNORMAL HIGH (ref 70–99)
Potassium: 5.8 mmol/L — ABNORMAL HIGH (ref 3.5–5.1)
Potassium: 5.6 mmol/L — ABNORMAL HIGH (ref 3.5–5.1)
Sodium: 141 mmol/L (ref 135–145)
Sodium: 140 mmol/L (ref 135–145)

## 2023-08-16 LAB — CBC
HCT: 45.9 % (ref 39.0–52.0)
HCT: 42.7 % (ref 39.0–52.0)
Hemoglobin: 13.8 g/dL (ref 13.0–17.0)
Hemoglobin: 12.8 g/dL — ABNORMAL LOW (ref 13.0–17.0)
MCH: 25.4 pg — ABNORMAL LOW (ref 26.0–34.0)
MCH: 24.9 pg — ABNORMAL LOW (ref 26.0–34.0)
MCHC: 30.1 g/dL (ref 30.0–36.0)
MCHC: 30 g/dL (ref 30.0–36.0)
MCV: 84.5 fL (ref 80.0–100.0)
MCV: 83.1 fL (ref 80.0–100.0)
Platelets: 328 10*3/uL (ref 150–400)
Platelets: 186 10*3/uL (ref 150–400)
RBC: 5.43 MIL/uL (ref 4.22–5.81)
RBC: 5.14 MIL/uL (ref 4.22–5.81)
RDW: 14.8 % (ref 11.5–15.5)
RDW: 15.1 % (ref 11.5–15.5)
WBC: 27.8 10*3/uL — ABNORMAL HIGH (ref 4.0–10.5)
WBC: 17.8 10*3/uL — ABNORMAL HIGH (ref 4.0–10.5)
nRBC: 0.1 % (ref 0.0–0.2)
nRBC: 0 % (ref 0.0–0.2)

## 2023-08-16 LAB — PROCALCITONIN: Procalcitonin: 15.37 ng/mL

## 2023-08-16 LAB — GLUCOSE, CAPILLARY: Glucose-Capillary: 146 mg/dL — ABNORMAL HIGH (ref 70–99)

## 2023-08-16 LAB — BRAIN NATRIURETIC PEPTIDE: B Natriuretic Peptide: 1272 pg/mL — ABNORMAL HIGH (ref 0.0–100.0)

## 2023-08-16 LAB — HEMOGLOBIN A1C
Hgb A1c MFr Bld: 6.6 % — ABNORMAL HIGH (ref 4.8–5.6)
Mean Plasma Glucose: 142.72 mg/dL

## 2023-08-16 LAB — MAGNESIUM: Magnesium: 2.8 mg/dL — ABNORMAL HIGH (ref 1.7–2.4)

## 2023-08-16 LAB — HEPARIN LEVEL (UNFRACTIONATED): Heparin Unfractionated: 0.15 IU/mL — ABNORMAL LOW (ref 0.30–0.70)

## 2023-08-16 LAB — LACTIC ACID, PLASMA: Lactic Acid, Venous: 3 mmol/L (ref 0.5–1.9)

## 2023-08-16 LAB — PHOSPHORUS: Phosphorus: 5.1 mg/dL — ABNORMAL HIGH (ref 2.5–4.6)

## 2023-08-16 MED ORDER — ARFORMOTEROL TARTRATE 15 MCG/2ML IN NEBU
15.0000 ug | INHALATION_SOLUTION | Freq: Two times a day (BID) | RESPIRATORY_TRACT | Status: DC
  Administered 2023-08-16 – 2023-08-24 (×16): 15 ug via RESPIRATORY_TRACT
  Filled 2023-08-16 (×18): qty 2

## 2023-08-16 MED ORDER — PROPOFOL 1000 MG/100ML IV EMUL
5.0000 ug/kg/min | INTRAVENOUS | Status: DC
  Administered 2023-08-16: 15 ug/kg/min via INTRAVENOUS
  Administered 2023-08-16 – 2023-08-17 (×2): 10 ug/kg/min via INTRAVENOUS
  Administered 2023-08-17: 15 ug/kg/min via INTRAVENOUS
  Filled 2023-08-16 (×3): qty 100

## 2023-08-16 MED ORDER — DEXTROSE 50 % IV SOLN
1.0000 | Freq: Once | INTRAVENOUS | Status: AC
  Administered 2023-08-16: 50 mL via INTRAVENOUS
  Filled 2023-08-16: qty 50

## 2023-08-16 MED ORDER — ENOXAPARIN SODIUM 30 MG/0.3ML IJ SOSY: 30.0000 mg | PREFILLED_SYRINGE | INTRAMUSCULAR | Status: DC

## 2023-08-16 MED ORDER — SODIUM ZIRCONIUM CYCLOSILICATE 10 G PO PACK
10.0000 g | PACK | Freq: Once | ORAL | Status: AC
  Administered 2023-08-16: 10 g
  Filled 2023-08-16: qty 1

## 2023-08-16 MED ORDER — RENA-VITE PO TABS
1.0000 | ORAL_TABLET | Freq: Every day | ORAL | Status: DC
  Administered 2023-08-16: 1
  Filled 2023-08-16: qty 1

## 2023-08-16 MED ORDER — METHYLPREDNISOLONE SODIUM SUCC 40 MG IJ SOLR
40.0000 mg | Freq: Two times a day (BID) | INTRAMUSCULAR | Status: DC
  Administered 2023-08-16 – 2023-08-18 (×6): 40 mg via INTRAVENOUS
  Filled 2023-08-16 (×6): qty 1

## 2023-08-16 MED ORDER — ASPIRIN 81 MG PO CHEW
81.0000 mg | CHEWABLE_TABLET | Freq: Every day | ORAL | Status: DC
  Administered 2023-08-17: 81 mg
  Filled 2023-08-16: qty 1

## 2023-08-16 MED ORDER — VITAL HIGH PROTEIN PO LIQD: 1000.0000 mL | ORAL | Status: DC

## 2023-08-16 MED ORDER — INSULIN ASPART 100 UNIT/ML IV SOLN
5.0000 [IU] | Freq: Once | INTRAVENOUS | Status: AC
  Administered 2023-08-16: 5 [IU] via INTRAVENOUS

## 2023-08-16 MED ORDER — DOXYCYCLINE HYCLATE 100 MG PO TABS
100.0000 mg | ORAL_TABLET | Freq: Two times a day (BID) | ORAL | Status: DC
  Administered 2023-08-16 – 2023-08-17 (×3): 100 mg
  Filled 2023-08-16 (×3): qty 1

## 2023-08-16 MED ORDER — BUDESONIDE 0.25 MG/2ML IN SUSP
0.2500 mg | Freq: Two times a day (BID) | RESPIRATORY_TRACT | Status: DC
  Administered 2023-08-16 – 2023-08-24 (×16): 0.25 mg via RESPIRATORY_TRACT
  Filled 2023-08-16 (×18): qty 2

## 2023-08-16 MED ORDER — VITAL 1.5 CAL PO LIQD
1000.0000 mL | ORAL | Status: DC
  Administered 2023-08-16: 1000 mL

## 2023-08-16 MED ORDER — INSULIN ASPART 100 UNIT/ML IV SOLN
10.0000 [IU] | Freq: Once | INTRAVENOUS | Status: AC
  Administered 2023-08-16: 10 [IU] via INTRAVENOUS

## 2023-08-16 MED ORDER — SODIUM CHLORIDE 0.9 % IV SOLN
1.0000 g | Freq: Once | INTRAVENOUS | Status: AC
  Administered 2023-08-16: 1 g via INTRAVENOUS
  Filled 2023-08-16: qty 10

## 2023-08-16 MED ORDER — SODIUM ZIRCONIUM CYCLOSILICATE 10 G PO PACK
10.0000 g | PACK | Freq: Two times a day (BID) | ORAL | Status: AC
  Administered 2023-08-16 (×2): 10 g
  Filled 2023-08-16 (×2): qty 1

## 2023-08-16 MED ORDER — ALBUTEROL SULFATE (2.5 MG/3ML) 0.083% IN NEBU
10.0000 mg | INHALATION_SOLUTION | Freq: Once | RESPIRATORY_TRACT | Status: AC
  Administered 2023-08-16: 10 mg via RESPIRATORY_TRACT
  Filled 2023-08-16: qty 12

## 2023-08-16 MED ORDER — ATORVASTATIN CALCIUM 40 MG PO TABS
40.0000 mg | ORAL_TABLET | Freq: Every day | ORAL | Status: DC
  Administered 2023-08-17: 40 mg
  Filled 2023-08-16 (×2): qty 1

## 2023-08-16 MED ORDER — CALCIUM GLUCONATE-NACL 1-0.675 GM/50ML-% IV SOLN
1.0000 g | Freq: Once | INTRAVENOUS | Status: AC
  Administered 2023-08-16: 1000 mg via INTRAVENOUS
  Filled 2023-08-16: qty 50

## 2023-08-16 MED ORDER — REVEFENACIN 175 MCG/3ML IN SOLN
175.0000 ug | Freq: Every day | RESPIRATORY_TRACT | Status: DC
  Administered 2023-08-16 – 2023-08-24 (×9): 175 ug via RESPIRATORY_TRACT
  Filled 2023-08-16 (×9): qty 3

## 2023-08-16 MED ORDER — PROSOURCE TF20 ENFIT COMPATIBL EN LIQD
60.0000 mL | Freq: Two times a day (BID) | ENTERAL | Status: DC
  Administered 2023-08-16 – 2023-08-17 (×2): 60 mL
  Filled 2023-08-16 (×2): qty 60

## 2023-08-16 MED ORDER — HEPARIN (PORCINE) 25000 UT/250ML-% IV SOLN
1850.0000 [IU]/h | INTRAVENOUS | Status: DC
  Administered 2023-08-16: 1200 [IU]/h via INTRAVENOUS
  Administered 2023-08-17 – 2023-08-19 (×2): 1850 [IU]/h via INTRAVENOUS
  Filled 2023-08-16 (×5): qty 250

## 2023-08-16 MED ORDER — ORAL CARE MOUTH RINSE
15.0000 mL | OROMUCOSAL | Status: DC
  Administered 2023-08-16 – 2023-08-21 (×48): 15 mL via OROMUCOSAL

## 2023-08-16 MED ORDER — ORAL CARE MOUTH RINSE: 15.0000 mL | OROMUCOSAL | Status: DC | PRN

## 2023-08-16 MED ORDER — ASPIRIN 81 MG PO CHEW
324.0000 mg | CHEWABLE_TABLET | Freq: Once | ORAL | Status: DC
  Filled 2023-08-16: qty 4

## 2023-08-16 MED ORDER — INSULIN ASPART 100 UNIT/ML IJ SOLN
0.0000 [IU] | INTRAMUSCULAR | Status: DC
  Administered 2023-08-16: 5 [IU] via SUBCUTANEOUS
  Administered 2023-08-16 – 2023-08-17 (×2): 3 [IU] via SUBCUTANEOUS
  Administered 2023-08-17: 1 [IU] via SUBCUTANEOUS
  Administered 2023-08-17: 3 [IU] via SUBCUTANEOUS
  Administered 2023-08-17: 5 [IU] via SUBCUTANEOUS
  Administered 2023-08-17 – 2023-08-19 (×5): 1 [IU] via SUBCUTANEOUS
  Administered 2023-08-19: 2 [IU] via SUBCUTANEOUS
  Administered 2023-08-19: 1 [IU] via SUBCUTANEOUS
  Administered 2023-08-20 (×3): 2 [IU] via SUBCUTANEOUS
  Administered 2023-08-20: 1 [IU] via SUBCUTANEOUS
  Administered 2023-08-21: 5 [IU] via SUBCUTANEOUS
  Administered 2023-08-21: 2 [IU] via SUBCUTANEOUS
  Administered 2023-08-21: 1 [IU] via SUBCUTANEOUS
  Administered 2023-08-21: 7 [IU] via SUBCUTANEOUS
  Administered 2023-08-22: 2 [IU] via SUBCUTANEOUS
  Administered 2023-08-22: 5 [IU] via SUBCUTANEOUS
  Administered 2023-08-22: 2 [IU] via SUBCUTANEOUS
  Administered 2023-08-22: 5 [IU] via SUBCUTANEOUS
  Administered 2023-08-22 – 2023-08-23 (×2): 2 [IU] via SUBCUTANEOUS
  Administered 2023-08-23 (×2): 1 [IU] via SUBCUTANEOUS
  Administered 2023-08-23: 2 [IU] via SUBCUTANEOUS
  Administered 2023-08-24: 1 [IU] via SUBCUTANEOUS
  Administered 2023-08-24: 2 [IU] via SUBCUTANEOUS
  Administered 2023-08-24: 1 [IU] via SUBCUTANEOUS
  Administered 2023-08-24: 3 [IU] via SUBCUTANEOUS
  Administered 2023-08-24: 1 [IU] via SUBCUTANEOUS

## 2023-08-16 MED ORDER — VITAMIN C 500 MG PO TABS
250.0000 mg | ORAL_TABLET | Freq: Two times a day (BID) | ORAL | Status: DC
  Administered 2023-08-16 – 2023-08-17 (×2): 250 mg
  Filled 2023-08-16 (×2): qty 1

## 2023-08-16 MED ORDER — CHLORHEXIDINE GLUCONATE CLOTH 2 % EX PADS
6.0000 | MEDICATED_PAD | Freq: Every day | CUTANEOUS | Status: DC
  Administered 2023-08-16 – 2023-08-18 (×2): 6 via TOPICAL

## 2023-08-16 MED ORDER — PROSOURCE TF20 ENFIT COMPATIBL EN LIQD
60.0000 mL | Freq: Every day | ENTERAL | Status: DC
  Filled 2023-08-16: qty 60

## 2023-08-16 MED ORDER — SODIUM CHLORIDE 0.9 % IV BOLUS
500.0000 mL | Freq: Once | INTRAVENOUS | Status: AC
  Administered 2023-08-16: 500 mL via INTRAVENOUS

## 2023-08-16 MED FILL — Sodium Chloride IV Soln 0.9%: INTRAVENOUS | Qty: 250 | Status: AC

## 2023-08-16 MED FILL — Fentanyl Citrate Preservative Free (PF) Inj 2500 MCG/50ML: INTRAMUSCULAR | Qty: 50 | Status: AC

## 2023-08-16 NOTE — Progress Notes (Signed)
eLink Physician-Brief Progress Note Patient Name: Antonio [name]. DOB: [birth year] MRN: [id number]   Date of Service  08/16/2023  HPI/Events of Note  Notified that pt with ST elevations detected on cardiac monitor.   eICU Interventions  EKG done. Shows some QRS widening, non-specific intraventricular delay. Appears similar to prior EKG. K also 5.9 on latest BMP.  Hyperkalemia orders placed: insulin+glucose, loklema, calcium gluconate.   Also noted decreased UO - only 45ml out since 11am. Fluid challenge with bolus ordered. Will follow response.         Alastor Kneale M DELA CRUZ 08/16/2023, 9:47 PM  12:08 AM Repeat K still slightly elevated at 5.6 Pt still has not had a bowel movement since earlier dose of loklema Ordered another round of hyperkalemia medications: insulin+glucose, calcium gluconate, bicarb, lokelma.  Maintain on continuous cardiac monitoring.  Follow serial K  3:05 AM PRN hydralazine ordered for persistently elevated BP

## 2023-08-16 NOTE — Progress Notes (Signed)
LTM EEG hooked up and running - no initial skin breakdown - push button tested - Not monitored by [facility] no connection to server/internet.

## 2023-08-16 NOTE — Progress Notes (Signed)
ANTICOAGULATION CONSULT NOTE  Pharmacy Consult for heparin Indication: chest pain/ACS  Allergies  Allergen Reactions   Iodinated Contrast Media Hives, Rash and Other (See Comments)    Blisters Staph Blisters / staph   Penicillins Other (See Comments)    UNSPECIFIED REACTION  Has patient had a PCN reaction causing immediate rash, facial/tongue/throat swelling, SOB or lightheadedness with hypotension: No Has patient had a PCN reaction causing severe rash involving mucus membranes or skin necrosis: No Has patient had a PCN reaction that required hospitalization: No Has patient had a PCN reaction occurring within the last 10 years: No If all of the above answers are "NO", then may proceed with Cephalosporin use.    Patient Measurements: Height: 6' (182.9 cm) Weight: 102.9 kg (226 lb 13.7 oz) IBW/kg (Calculated) : 77.6 Heparin Dosing Weight: 100kg  Vital Signs: Temp: 96.8 F (36 C) (09/20 1900) Temp Source: Bladder (09/20 0800) BP: 114/70 (09/20 1900) Pulse Rate: 92 (09/20 1900)  Labs: Recent Labs    08/18/2023 1535 08/23/2023 1752 08/23/2023 2110 08/16/23 0319 08/16/23 0350 08/16/23 0428 08/16/23 1333 08/16/23 1901  HGB 15.6  15.3   < > 13.8  --  14.3 13.8 12.8*  --   HCT 46.0  45.0   < > 46.8  --  42.0 45.9 42.7  --   PLT  --   --  391  --   --  328 186  --   HEPARINUNFRC  --   --   --   --   --   --   --  0.15*  CREATININE 2.20*  --  2.83* 3.07*  --   --   --   --   TROPONINIHS  --   --  6,747* >24,000*  --   --   --   --    < > = values in this interval not displayed.    Estimated Creatinine Clearance: 29.8 mL/min (A) (by C-G formula based on SCr of 3.07 mg/dL (H)).   Medical History: Past Medical History:  Diagnosis Date   Coronary artery disease    Diabetes mellitus without complication (HCC)    typ2   GERD (gastroesophageal reflux disease)    Hyperlipidemia    Hypertension    Osteomyelitis (HCC) 03/25/2017   RT FOOT     Assessment: 81 yoM admitted  with urinary symptoms. Pt subsequently had PEA arrest and noted to have AV block with elevated troponins. Pharmacy to start IV heparin for ACS r/o. CBC ok, no AC PTA. Will defer initial bolus given recent compressions w/ rib fractures.  Heparin level (0.15) subtherapeutic - as expected after no bolus.  No s/sx bleeding noted.  Goal of Therapy:  Heparin level 0.3-0.7 units/ml Monitor platelets by anticoagulation protocol: Yes   Plan:  Increase heparin to 1550 units/h 8h heparin level Monitor daily heparin level, CBC F/u after Monday LHC  Trixie Rude, PharmD Clinical Pharmacist 08/16/2023  7:55 PM

## 2023-08-16 NOTE — Progress Notes (Signed)
Updated son over phone: Carlin Eze (son) [phone]   Myrla Halsted MD PCCM

## 2023-08-16 NOTE — Procedures (Signed)
Patient Name: Antonio [name] [facility].  MRN: [id number]  Epilepsy Attending: Charlsie Quest  Referring Physician/Provider: Cheri Fowler, MD  Duration: 08/16/2023 0025 to 08/17/2023 0025  Patient history: 67yo M s/p cardiac arrest getting eeg to evaluate for seizure  Level of alertness: comatose  AEDs during EEG study: Versed, propofol  Technical aspects: This EEG study was done with scalp electrodes positioned according to the 10-20 International system of electrode placement. Electrical activity was reviewed with band pass filter of 1-70Hz , sensitivity of 7 uV/mm, display speed of 60mm/sec with a 60Hz  notched filter applied as appropriate. EEG data were recorded continuously and digitally stored.  Video monitoring was available and reviewed as appropriate.  Description: EEG showed continuous generalized 3 to 6 Hz theta-delta slowing admixed with an excessive amount of 15 to 18 Hz beta activity distributed symmetrically and diffusely. Hyperventilation and photic stimulation were not performed.     ABNORMALITY - Continuous slow, generalized - Excessive beta, generalized  IMPRESSION: This study is suggestive of severe diffuse encephalopathy likely related to sedation. No seizures or epileptiform discharges were seen throughout the recording.  Antonio [name]

## 2023-08-16 NOTE — Plan of Care (Signed)
  Problem: Cardiovascular: Goal: Ability to achieve and maintain adequate cardiovascular perfusion will improve Outcome: Not Progressing   Problem: Clinical Measurements: Goal: Respiratory complications will improve Outcome: Not Progressing   Problem: Elimination: Goal: Will not experience complications related to bowel motility Outcome: Not Progressing   Problem: Nutrition: Goal: Adequate nutrition will be maintained Outcome: Not Progressing   Problem: Skin Integrity: Goal: Risk for impaired skin integrity will decrease Outcome: Not Progressing

## 2023-08-16 NOTE — Progress Notes (Signed)
NAME:  Antonio Denino., MRN:  [id number], DOB:  Mar 22, 1956, LOS: 1 ADMISSION DATE:  08/09/2023, CONSULTATION DATE:  9/19 REFERRING MD:  Dr. Criss Alvine, CHIEF COMPLAINT:  cardiac arrest   History of Present Illness:  Patient is a 67 yo M w/ pertinent PMH of HTN, CAD s/p stent placement, PAD s/p L BKA and LSFA stenting 2013, T2DM, right foot osteomyelitis s/p right iliofemoral endarterectomy and right metatarsal amputation presents to [facility] ED on 9/19 w/ UTI.  Patient having UTI symptoms for the past 4-5 days. Also having b/l back pain and abd pain. Also having chronic right leg pain that has been progressively worsening over the last 4-5 months. On arrival patient alert and hypertensive 162/120. Afebrile and wbc 7.8. UA w/ leukocytes and uc pending. Started on rocephin. Patient also w/ creat 2.32. Patient taken to vascular lab and dvt US with no evidence of dvt and arterial duplex showing 75-99% stenosis in deep femoral artery and 50-74% stenosis in superficial femoral artery recommending vascular consult.   While in ED patient found became unresponsive w/ clinched teeth. Patient was pulseless and cpr started w/ rosc in 2 minutes. Attempted to intubated after code and very difficult airway and initially could not pass ETT. Patient coded during intubation procedure. 6.5 ETT was able to be placed and ROSC was achieved. Patient on levo and epi. Patient hr 50s. EKG showing complete AV block. Cards consulted and taking for emergent cath. PCCM consulted for icu admission.  Pertinent  Medical History   Past Medical History:  Diagnosis Date   Coronary artery disease    Diabetes mellitus without complication (HCC)    typ2   GERD (gastroesophageal reflux disease)    Hyperlipidemia    Hypertension    Osteomyelitis (HCC) 03/25/2017   RT FOOT     Significant Hospital Events: Including procedures, antibiotic start and stop dates in addition to other pertinent events   9/19 admitted initially w/ UTI;  while in ED pea arrest, difficult intubation. Complete heart block going to cath lab  Interim History / Subjective:  No events. Off pressors. Following commands.  Objective   Blood pressure 108/77, pulse 91, temperature 97.9 F (36.6 C), temperature source Bladder, resp. rate (!) 30, height 6' (1.829 m), weight 102.9 kg, SpO2 99%.    Vent Mode: PRVC FiO2 (%):  [40 %-100 %] 40 % Set Rate:  [24 bmp-30 bmp] 30 bmp Vt Set:  [500 mL-620 mL] 620 mL PEEP:  [5 cmH20] 5 cmH20 Plateau Pressure:  [20 cmH20] 20 cmH20   Intake/Output Summary (Last 24 hours) at 08/16/2023 0855 Last data filed at 08/16/2023 0800 Gross per 24 hour  Intake 1065.64 ml  Output 100 ml  Net 965.64 ml   Filed Weights   08/16/23 0500  Weight: 102.9 kg    Examination: Chronically ill appearing Diffuse anasarca mild Scattered rhonci, +bronchospasm on vent (vs related to 6-5 tube) L BKA noted R toe amputations noted Moves ext to command Pupils equal Noted chest wall tenderness  CT C/A/P noted  Resolved Hospital Problem list     Assessment & Plan:   PEA arrest, Complete heart block, Cardiogenic shock- shock resolved; brief arrest, seems neuro intact Acute respiratory failure w/ hypoxia and hypercapnia- significant emphysema noted on CT Difficult airway CPR- induced rib fractures UTI AKI CAD HTN HLD PAD T2DM  - Overall picture is unclear whether this was ACS vs. Allergic reaction; doubt latter - Rechallenge with ceftriaxone - Steroids, bronchodilators - Switch versed to prop -  Eventual need for LHC and GDMT - Eventual need for VVS conslt for R femoral atherosclerotic disease, with AKI doubt anything can be done at present - Can take out TVP whenever  Let rest on vent today, likely wean tomorrow High risk to progress to needing HD given hx  Best Practice (right click and "Reselect all SmartList Selections" daily)   Diet/type: start TF DVT prophylaxis:  GI prophylaxis: H2B Lines:  N/A Foley:  yes Code Status:  full code Last date of multidisciplinary goals of care discussion [pending]  33 min cc time Myrla Halsted MD PCCM [facility] 08/16/2023, 8:55 AM  Please see [email or web address] for pager details.  From 7A-7P if no response, please call [phone]. After hours, please call ELink [phone].

## 2023-08-16 NOTE — Progress Notes (Signed)
In anticipation for hopefully better clinical status and improved renal function we will plan for right and left cardiac catheterization this Monday.  Will need to evaluate renal status day of to ensure stable for procedure.  Currently patient is intubated and not able to provide consent.  Consent was obtained with nursing staff present via son Informed Consent   Shared Decision Making/Informed Consent{  The risks [stroke (1 in 1000), death (1 in 1000), kidney failure [usually temporary] (1 in 500), bleeding (1 in 200), allergic reaction [possibly serious] (1 in 200)], benefits (diagnostic support and management of coronary artery disease) and alternatives of a cardiac catheterization were discussed in detail with Antonio [name] and he is willing to proceed.

## 2023-08-16 NOTE — Progress Notes (Signed)
*  PRELIMINARY RESULTS* Echocardiogram 2D Echocardiogram has been performed.  Antonio [name] 08/16/2023, 9:05 AM

## 2023-08-16 NOTE — Progress Notes (Signed)
ANTICOAGULATION CONSULT NOTE  Pharmacy Consult for heparin Indication: chest pain/ACS  Allergies  Allergen Reactions   Iodinated Contrast Media Hives, Rash and Other (See Comments)    Blisters Staph Blisters / staph   Penicillins Other (See Comments)    UNSPECIFIED REACTION  Has patient had a PCN reaction causing immediate rash, facial/tongue/throat swelling, SOB or lightheadedness with hypotension: No Has patient had a PCN reaction causing severe rash involving mucus membranes or skin necrosis: No Has patient had a PCN reaction that required hospitalization: No Has patient had a PCN reaction occurring within the last 10 years: No If all of the above answers are "NO", then may proceed with Cephalosporin use.    Patient Measurements: Height: 6' (182.9 cm) Weight: 102.9 kg (226 lb 13.7 oz) IBW/kg (Calculated) : 77.6 Heparin Dosing Weight: 100kg  Vital Signs: Temp: 97.9 F (36.6 C) (09/20 0800) Temp Source: Bladder (09/20 0800) BP: 108/77 (09/20 0814) Pulse Rate: 91 (09/20 0814)  Labs: Recent Labs    08/05/2023 1316 08/12/2023 1535 08/01/2023 1752 08/02/2023 2110 08/16/23 0319 08/16/23 0350 08/16/23 0428  HGB 12.9* 15.6  15.3   < > 13.8  --  14.3 13.8  HCT 43.1 46.0  45.0   < > 46.8  --  42.0 45.9  PLT 268  --   --  391  --   --  328  CREATININE 2.32* 2.20*  --  2.83* 3.07*  --   --   TROPONINIHS  --   --   --  6,747* >24,000*  --   --    < > = values in this interval not displayed.    Estimated Creatinine Clearance: 29.8 mL/min (A) (by C-G formula based on SCr of 3.07 mg/dL (H)).   Medical History: Past Medical History:  Diagnosis Date   Coronary artery disease    Diabetes mellitus without complication (HCC)    typ2   GERD (gastroesophageal reflux disease)    Hyperlipidemia    Hypertension    Osteomyelitis (HCC) 03/25/2017   RT FOOT     Assessment: 3 yoM admitted with urinary symptoms. Pt subsequently had PEA arrest and noted to have AV block with  elevated troponins. Pharmacy to start IV heparin for ACS r/o. CBC ok, no AC PTA. Will defer initial bolus given recent compressions w/ rib fractures.  Goal of Therapy:  Heparin level 0.3-0.7 units/ml Monitor platelets by anticoagulation protocol: Yes   Plan:  Heparin 1200 units/h Check heparin level in 8h  Fredonia Highland, PharmD, [place], [facility] Clinical Pharmacist [phone] Please check AMION for all [facility] Pharmacy numbers 08/16/2023

## 2023-08-16 NOTE — Plan of Care (Signed)
  Problem: Education: Goal: Understanding of CV disease, CV risk reduction, and recovery process will improve Outcome: Progressing Goal: Individualized Educational Video(s) Outcome: Progressing   Problem: Cardiovascular: Goal: Ability to achieve and maintain adequate cardiovascular perfusion will improve Outcome: Progressing Goal: Vascular access site(s) Level 0-1 will be maintained Outcome: Progressing   Problem: Clinical Measurements: Goal: Ability to maintain clinical measurements within normal limits will improve Outcome: Progressing Goal: Will remain free from infection Outcome: Progressing Goal: Diagnostic test results will improve Outcome: Progressing Goal: Respiratory complications will improve Outcome: Progressing Goal: Cardiovascular complication will be avoided Outcome: Progressing   Problem: Nutrition: Goal: Adequate nutrition will be maintained Outcome: Progressing   Problem: Pain Managment: Goal: General experience of comfort will improve Outcome: Progressing

## 2023-08-16 NOTE — Progress Notes (Signed)
inflammatory changes. Spleen: Normal in size without focal abnormality. Adrenals/Urinary Tract: Adrenal glands are within normal limits. Kidneys are well visualized bilaterally. No renal calculi or obstructive changes are seen. The ureters are within normal limits. The bladder is decompressed. Stomach/Bowel: No obstructive or inflammatory changes of the colon are noted. The appendix has been surgically removed. Small bowel and stomach are unremarkable. Vascular/Lymphatic: Aortic atherosclerosis. No enlarged abdominal or pelvic lymph nodes. Right femoral central venous line is noted extending into the IVC. Reproductive: Prostate is unremarkable. Other: No abdominal wall hernia or abnormality. No abdominopelvic ascites. Musculoskeletal: No acute or significant osseous findings. IMPRESSION: CT of the chest: Multiple bilateral rib fractures are noted without complicating factors. Moderate to large bilateral pleural effusions with associated atelectatic changes. Aortic Atherosclerosis (ICD10-I70.0) and Emphysema (ICD10-J43.9). CT of the abdomen and pelvis: No acute abnormality noted. Electronically Signed   By: Alcide Clever M.D.   On: 08/02/2023 23:06   DG Abd 1 View  Result Date: 07/30/2023 CLINICAL DATA:  OG tube placement EXAM: ABDOMEN - 1 VIEW COMPARISON:  Antonio [name] Available. FINDINGS:  Enteric tube terminates in the gastric cardia. Nonobstructive bowel gas pattern. Mild degenerative changes of the lumbar spine. IMPRESSION: Enteric tube terminates in the gastric cardia. Electronically Signed   By: Charline Bills M.D.   On: 08/12/2023 22:00   CARDIAC CATHETERIZATION  Result Date: 07/30/2023   Successful placement of transvemous pacer from the right femoral vein. Continue supportive efforts to treat his critical illness.    DG Chest Portable 1 View  Result Date: 08/04/2023 CLINICAL DATA:  Cardiac arrest, post intubation EXAM: PORTABLE CHEST 1 VIEW COMPARISON:  02/03/2022 FINDINGS: Mild right lobe scarring/atelectasis. No pleural effusion or pneumothorax. Cardiomegaly. Endotracheal tube terminates 7 cm above the carina. IMPRESSION: Endotracheal tube terminates 7 cm above the carina. Electronically Signed   By: Charline Bills M.D.   On: 08/05/2023 17:17   VAS [place] LOWER EXTREMITY VENOUS (DVT) (7a-7p)  Result Date: 07/29/2023  Lower Venous DVT Study Patient Name:  Antonio [name] [facility].  Date of Exam:   08/08/2023 Medical Rec #: [id number]               Accession #:    [id number] Date of Birth: [birth year]               Patient Gender: M Patient Age:   67 years Exam Location:  [facility] Procedure:      VAS [place] LOWER EXTREMITY VENOUS (DVT) Referring Phys: Lorin Picket [place] --------------------------------------------------------------------------------  Indications: Pain and swelling in right calf for several months. History of severe PVD.  Risk Factors: Surgery left AKA. Performing Technologist: Marilynne Halsted RDMS, RVT  Examination Guidelines: A complete evaluation includes B-mode imaging, spectral Doppler, color Doppler, and power Doppler as needed of all accessible portions of each vessel. Bilateral testing is considered an integral part of a complete examination. Limited examinations for reoccurring indications may be performed as noted. The reflux portion of the exam is performed  with the patient in reverse Trendelenburg.  +---------+---------------+---------+-----------+----------+-------------------+ RIGHT    CompressibilityPhasicitySpontaneityPropertiesThrombus Aging      +---------+---------------+---------+-----------+----------+-------------------+ CFV      Full           Yes      Yes                                      +---------+---------------+---------+-----------+----------+-------------------+ SFJ      Full                                                             +---------+---------------+---------+-----------+----------+-------------------+  inflammatory changes. Spleen: Normal in size without focal abnormality. Adrenals/Urinary Tract: Adrenal glands are within normal limits. Kidneys are well visualized bilaterally. No renal calculi or obstructive changes are seen. The ureters are within normal limits. The bladder is decompressed. Stomach/Bowel: No obstructive or inflammatory changes of the colon are noted. The appendix has been surgically removed. Small bowel and stomach are unremarkable. Vascular/Lymphatic: Aortic atherosclerosis. No enlarged abdominal or pelvic lymph nodes. Right femoral central venous line is noted extending into the IVC. Reproductive: Prostate is unremarkable. Other: No abdominal wall hernia or abnormality. No abdominopelvic ascites. Musculoskeletal: No acute or significant osseous findings. IMPRESSION: CT of the chest: Multiple bilateral rib fractures are noted without complicating factors. Moderate to large bilateral pleural effusions with associated atelectatic changes. Aortic Atherosclerosis (ICD10-I70.0) and Emphysema (ICD10-J43.9). CT of the abdomen and pelvis: No acute abnormality noted. Electronically Signed   By: Alcide Clever M.D.   On: 08/02/2023 23:06   DG Abd 1 View  Result Date: 07/30/2023 CLINICAL DATA:  OG tube placement EXAM: ABDOMEN - 1 VIEW COMPARISON:  Antonio [name] Available. FINDINGS:  Enteric tube terminates in the gastric cardia. Nonobstructive bowel gas pattern. Mild degenerative changes of the lumbar spine. IMPRESSION: Enteric tube terminates in the gastric cardia. Electronically Signed   By: Charline Bills M.D.   On: 08/12/2023 22:00   CARDIAC CATHETERIZATION  Result Date: 07/30/2023   Successful placement of transvemous pacer from the right femoral vein. Continue supportive efforts to treat his critical illness.    DG Chest Portable 1 View  Result Date: 08/04/2023 CLINICAL DATA:  Cardiac arrest, post intubation EXAM: PORTABLE CHEST 1 VIEW COMPARISON:  02/03/2022 FINDINGS: Mild right lobe scarring/atelectasis. No pleural effusion or pneumothorax. Cardiomegaly. Endotracheal tube terminates 7 cm above the carina. IMPRESSION: Endotracheal tube terminates 7 cm above the carina. Electronically Signed   By: Charline Bills M.D.   On: 08/05/2023 17:17   VAS [place] LOWER EXTREMITY VENOUS (DVT) (7a-7p)  Result Date: 07/29/2023  Lower Venous DVT Study Patient Name:  Antonio [name] [facility].  Date of Exam:   08/08/2023 Medical Rec #: [id number]               Accession #:    [id number] Date of Birth: [birth year]               Patient Gender: M Patient Age:   67 years Exam Location:  [facility] Procedure:      VAS [place] LOWER EXTREMITY VENOUS (DVT) Referring Phys: Lorin Picket [place] --------------------------------------------------------------------------------  Indications: Pain and swelling in right calf for several months. History of severe PVD.  Risk Factors: Surgery left AKA. Performing Technologist: Marilynne Halsted RDMS, RVT  Examination Guidelines: A complete evaluation includes B-mode imaging, spectral Doppler, color Doppler, and power Doppler as needed of all accessible portions of each vessel. Bilateral testing is considered an integral part of a complete examination. Limited examinations for reoccurring indications may be performed as noted. The reflux portion of the exam is performed  with the patient in reverse Trendelenburg.  +---------+---------------+---------+-----------+----------+-------------------+ RIGHT    CompressibilityPhasicitySpontaneityPropertiesThrombus Aging      +---------+---------------+---------+-----------+----------+-------------------+ CFV      Full           Yes      Yes                                      +---------+---------------+---------+-----------+----------+-------------------+ SFJ      Full                                                             +---------+---------------+---------+-----------+----------+-------------------+  inflammatory changes. Spleen: Normal in size without focal abnormality. Adrenals/Urinary Tract: Adrenal glands are within normal limits. Kidneys are well visualized bilaterally. No renal calculi or obstructive changes are seen. The ureters are within normal limits. The bladder is decompressed. Stomach/Bowel: No obstructive or inflammatory changes of the colon are noted. The appendix has been surgically removed. Small bowel and stomach are unremarkable. Vascular/Lymphatic: Aortic atherosclerosis. No enlarged abdominal or pelvic lymph nodes. Right femoral central venous line is noted extending into the IVC. Reproductive: Prostate is unremarkable. Other: No abdominal wall hernia or abnormality. No abdominopelvic ascites. Musculoskeletal: No acute or significant osseous findings. IMPRESSION: CT of the chest: Multiple bilateral rib fractures are noted without complicating factors. Moderate to large bilateral pleural effusions with associated atelectatic changes. Aortic Atherosclerosis (ICD10-I70.0) and Emphysema (ICD10-J43.9). CT of the abdomen and pelvis: No acute abnormality noted. Electronically Signed   By: Alcide Clever M.D.   On: 08/24/2023 23:06   DG Abd 1 View  Result Date: 08/05/2023 CLINICAL DATA:  OG tube placement EXAM: ABDOMEN - 1 VIEW COMPARISON:  Antonio [name] Available. FINDINGS:  Enteric tube terminates in the gastric cardia. Nonobstructive bowel gas pattern. Mild degenerative changes of the lumbar spine. IMPRESSION: Enteric tube terminates in the gastric cardia. Electronically Signed   By: Charline Bills M.D.   On: 08/09/2023 22:00   CARDIAC CATHETERIZATION  Result Date: 08/13/2023   Successful placement of transvemous pacer from the right femoral vein. Continue supportive efforts to treat his critical illness.    DG Chest Portable 1 View  Result Date: 08/18/2023 CLINICAL DATA:  Cardiac arrest, post intubation EXAM: PORTABLE CHEST 1 VIEW COMPARISON:  02/03/2022 FINDINGS: Mild right lobe scarring/atelectasis. No pleural effusion or pneumothorax. Cardiomegaly. Endotracheal tube terminates 7 cm above the carina. IMPRESSION: Endotracheal tube terminates 7 cm above the carina. Electronically Signed   By: Charline Bills M.D.   On: 08/07/2023 17:17   VAS [place] LOWER EXTREMITY VENOUS (DVT) (7a-7p)  Result Date: 08/18/2023  Lower Venous DVT Study Patient Name:  Antonio [name] [facility].  Date of Exam:   08/22/2023 Medical Rec #: [id number]               Accession #:    [id number] Date of Birth: [birth year]               Patient Gender: M Patient Age:   67 years Exam Location:  [facility] Procedure:      VAS [place] LOWER EXTREMITY VENOUS (DVT) Referring Phys: Lorin Picket [place] --------------------------------------------------------------------------------  Indications: Pain and swelling in right calf for several months. History of severe PVD.  Risk Factors: Surgery left AKA. Performing Technologist: Marilynne Halsted RDMS, RVT  Examination Guidelines: A complete evaluation includes B-mode imaging, spectral Doppler, color Doppler, and power Doppler as needed of all accessible portions of each vessel. Bilateral testing is considered an integral part of a complete examination. Limited examinations for reoccurring indications may be performed as noted. The reflux portion of the exam is performed  with the patient in reverse Trendelenburg.  +---------+---------------+---------+-----------+----------+-------------------+ RIGHT    CompressibilityPhasicitySpontaneityPropertiesThrombus Aging      +---------+---------------+---------+-----------+----------+-------------------+ CFV      Full           Yes      Yes                                      +---------+---------------+---------+-----------+----------+-------------------+ SFJ      Full                                                             +---------+---------------+---------+-----------+----------+-------------------+  inflammatory changes. Spleen: Normal in size without focal abnormality. Adrenals/Urinary Tract: Adrenal glands are within normal limits. Kidneys are well visualized bilaterally. No renal calculi or obstructive changes are seen. The ureters are within normal limits. The bladder is decompressed. Stomach/Bowel: No obstructive or inflammatory changes of the colon are noted. The appendix has been surgically removed. Small bowel and stomach are unremarkable. Vascular/Lymphatic: Aortic atherosclerosis. No enlarged abdominal or pelvic lymph nodes. Right femoral central venous line is noted extending into the IVC. Reproductive: Prostate is unremarkable. Other: No abdominal wall hernia or abnormality. No abdominopelvic ascites. Musculoskeletal: No acute or significant osseous findings. IMPRESSION: CT of the chest: Multiple bilateral rib fractures are noted without complicating factors. Moderate to large bilateral pleural effusions with associated atelectatic changes. Aortic Atherosclerosis (ICD10-I70.0) and Emphysema (ICD10-J43.9). CT of the abdomen and pelvis: No acute abnormality noted. Electronically Signed   By: Alcide Clever M.D.   On: 08/02/2023 23:06   DG Abd 1 View  Result Date: 08/22/2023 CLINICAL DATA:  OG tube placement EXAM: ABDOMEN - 1 VIEW COMPARISON:  Antonio [name] Available. FINDINGS:  Enteric tube terminates in the gastric cardia. Nonobstructive bowel gas pattern. Mild degenerative changes of the lumbar spine. IMPRESSION: Enteric tube terminates in the gastric cardia. Electronically Signed   By: Charline Bills M.D.   On: 08/02/2023 22:00   CARDIAC CATHETERIZATION  Result Date: 08/24/2023   Successful placement of transvemous pacer from the right femoral vein. Continue supportive efforts to treat his critical illness.    DG Chest Portable 1 View  Result Date: 08/20/2023 CLINICAL DATA:  Cardiac arrest, post intubation EXAM: PORTABLE CHEST 1 VIEW COMPARISON:  02/03/2022 FINDINGS: Mild right lobe scarring/atelectasis. No pleural effusion or pneumothorax. Cardiomegaly. Endotracheal tube terminates 7 cm above the carina. IMPRESSION: Endotracheal tube terminates 7 cm above the carina. Electronically Signed   By: Charline Bills M.D.   On: 08/08/2023 17:17   VAS [place] LOWER EXTREMITY VENOUS (DVT) (7a-7p)  Result Date: 08/17/2023  Lower Venous DVT Study Patient Name:  Antonio [name] [facility].  Date of Exam:   08/16/2023 Medical Rec #: [id number]               Accession #:    [id number] Date of Birth: [birth year]               Patient Gender: M Patient Age:   67 years Exam Location:  [facility] Procedure:      VAS [place] LOWER EXTREMITY VENOUS (DVT) Referring Phys: Lorin Picket [place] --------------------------------------------------------------------------------  Indications: Pain and swelling in right calf for several months. History of severe PVD.  Risk Factors: Surgery left AKA. Performing Technologist: Marilynne Halsted RDMS, RVT  Examination Guidelines: A complete evaluation includes B-mode imaging, spectral Doppler, color Doppler, and power Doppler as needed of all accessible portions of each vessel. Bilateral testing is considered an integral part of a complete examination. Limited examinations for reoccurring indications may be performed as noted. The reflux portion of the exam is performed  with the patient in reverse Trendelenburg.  +---------+---------------+---------+-----------+----------+-------------------+ RIGHT    CompressibilityPhasicitySpontaneityPropertiesThrombus Aging      +---------+---------------+---------+-----------+----------+-------------------+ CFV      Full           Yes      Yes                                      +---------+---------------+---------+-----------+----------+-------------------+ SFJ      Full                                                             +---------+---------------+---------+-----------+----------+-------------------+  inflammatory changes. Spleen: Normal in size without focal abnormality. Adrenals/Urinary Tract: Adrenal glands are within normal limits. Kidneys are well visualized bilaterally. No renal calculi or obstructive changes are seen. The ureters are within normal limits. The bladder is decompressed. Stomach/Bowel: No obstructive or inflammatory changes of the colon are noted. The appendix has been surgically removed. Small bowel and stomach are unremarkable. Vascular/Lymphatic: Aortic atherosclerosis. No enlarged abdominal or pelvic lymph nodes. Right femoral central venous line is noted extending into the IVC. Reproductive: Prostate is unremarkable. Other: No abdominal wall hernia or abnormality. No abdominopelvic ascites. Musculoskeletal: No acute or significant osseous findings. IMPRESSION: CT of the chest: Multiple bilateral rib fractures are noted without complicating factors. Moderate to large bilateral pleural effusions with associated atelectatic changes. Aortic Atherosclerosis (ICD10-I70.0) and Emphysema (ICD10-J43.9). CT of the abdomen and pelvis: No acute abnormality noted. Electronically Signed   By: Alcide Clever M.D.   On: 08/24/2023 23:06   DG Abd 1 View  Result Date: 08/05/2023 CLINICAL DATA:  OG tube placement EXAM: ABDOMEN - 1 VIEW COMPARISON:  Antonio [name] Available. FINDINGS:  Enteric tube terminates in the gastric cardia. Nonobstructive bowel gas pattern. Mild degenerative changes of the lumbar spine. IMPRESSION: Enteric tube terminates in the gastric cardia. Electronically Signed   By: Charline Bills M.D.   On: 08/09/2023 22:00   CARDIAC CATHETERIZATION  Result Date: 08/13/2023   Successful placement of transvemous pacer from the right femoral vein. Continue supportive efforts to treat his critical illness.    DG Chest Portable 1 View  Result Date: 08/18/2023 CLINICAL DATA:  Cardiac arrest, post intubation EXAM: PORTABLE CHEST 1 VIEW COMPARISON:  02/03/2022 FINDINGS: Mild right lobe scarring/atelectasis. No pleural effusion or pneumothorax. Cardiomegaly. Endotracheal tube terminates 7 cm above the carina. IMPRESSION: Endotracheal tube terminates 7 cm above the carina. Electronically Signed   By: Charline Bills M.D.   On: 08/07/2023 17:17   VAS [place] LOWER EXTREMITY VENOUS (DVT) (7a-7p)  Result Date: 08/18/2023  Lower Venous DVT Study Patient Name:  Antonio [name] [facility].  Date of Exam:   08/22/2023 Medical Rec #: [id number]               Accession #:    [id number] Date of Birth: [birth year]               Patient Gender: M Patient Age:   67 years Exam Location:  [facility] Procedure:      VAS [place] LOWER EXTREMITY VENOUS (DVT) Referring Phys: Lorin Picket [place] --------------------------------------------------------------------------------  Indications: Pain and swelling in right calf for several months. History of severe PVD.  Risk Factors: Surgery left AKA. Performing Technologist: Marilynne Halsted RDMS, RVT  Examination Guidelines: A complete evaluation includes B-mode imaging, spectral Doppler, color Doppler, and power Doppler as needed of all accessible portions of each vessel. Bilateral testing is considered an integral part of a complete examination. Limited examinations for reoccurring indications may be performed as noted. The reflux portion of the exam is performed  with the patient in reverse Trendelenburg.  +---------+---------------+---------+-----------+----------+-------------------+ RIGHT    CompressibilityPhasicitySpontaneityPropertiesThrombus Aging      +---------+---------------+---------+-----------+----------+-------------------+ CFV      Full           Yes      Yes                                      +---------+---------------+---------+-----------+----------+-------------------+ SFJ      Full                                                             +---------+---------------+---------+-----------+----------+-------------------+  inflammatory changes. Spleen: Normal in size without focal abnormality. Adrenals/Urinary Tract: Adrenal glands are within normal limits. Kidneys are well visualized bilaterally. No renal calculi or obstructive changes are seen. The ureters are within normal limits. The bladder is decompressed. Stomach/Bowel: No obstructive or inflammatory changes of the colon are noted. The appendix has been surgically removed. Small bowel and stomach are unremarkable. Vascular/Lymphatic: Aortic atherosclerosis. No enlarged abdominal or pelvic lymph nodes. Right femoral central venous line is noted extending into the IVC. Reproductive: Prostate is unremarkable. Other: No abdominal wall hernia or abnormality. No abdominopelvic ascites. Musculoskeletal: No acute or significant osseous findings. IMPRESSION: CT of the chest: Multiple bilateral rib fractures are noted without complicating factors. Moderate to large bilateral pleural effusions with associated atelectatic changes. Aortic Atherosclerosis (ICD10-I70.0) and Emphysema (ICD10-J43.9). CT of the abdomen and pelvis: No acute abnormality noted. Electronically Signed   By: Alcide Clever M.D.   On: 08/02/2023 23:06   DG Abd 1 View  Result Date: 07/30/2023 CLINICAL DATA:  OG tube placement EXAM: ABDOMEN - 1 VIEW COMPARISON:  Antonio [name] Available. FINDINGS:  Enteric tube terminates in the gastric cardia. Nonobstructive bowel gas pattern. Mild degenerative changes of the lumbar spine. IMPRESSION: Enteric tube terminates in the gastric cardia. Electronically Signed   By: Charline Bills M.D.   On: 08/12/2023 22:00   CARDIAC CATHETERIZATION  Result Date: 07/30/2023   Successful placement of transvemous pacer from the right femoral vein. Continue supportive efforts to treat his critical illness.    DG Chest Portable 1 View  Result Date: 08/04/2023 CLINICAL DATA:  Cardiac arrest, post intubation EXAM: PORTABLE CHEST 1 VIEW COMPARISON:  02/03/2022 FINDINGS: Mild right lobe scarring/atelectasis. No pleural effusion or pneumothorax. Cardiomegaly. Endotracheal tube terminates 7 cm above the carina. IMPRESSION: Endotracheal tube terminates 7 cm above the carina. Electronically Signed   By: Charline Bills M.D.   On: 08/05/2023 17:17   VAS [place] LOWER EXTREMITY VENOUS (DVT) (7a-7p)  Result Date: 07/29/2023  Lower Venous DVT Study Patient Name:  Antonio [name] [facility].  Date of Exam:   08/08/2023 Medical Rec #: [id number]               Accession #:    [id number] Date of Birth: [birth year]               Patient Gender: M Patient Age:   67 years Exam Location:  [facility] Procedure:      VAS [place] LOWER EXTREMITY VENOUS (DVT) Referring Phys: Lorin Picket [place] --------------------------------------------------------------------------------  Indications: Pain and swelling in right calf for several months. History of severe PVD.  Risk Factors: Surgery left AKA. Performing Technologist: Marilynne Halsted RDMS, RVT  Examination Guidelines: A complete evaluation includes B-mode imaging, spectral Doppler, color Doppler, and power Doppler as needed of all accessible portions of each vessel. Bilateral testing is considered an integral part of a complete examination. Limited examinations for reoccurring indications may be performed as noted. The reflux portion of the exam is performed  with the patient in reverse Trendelenburg.  +---------+---------------+---------+-----------+----------+-------------------+ RIGHT    CompressibilityPhasicitySpontaneityPropertiesThrombus Aging      +---------+---------------+---------+-----------+----------+-------------------+ CFV      Full           Yes      Yes                                      +---------+---------------+---------+-----------+----------+-------------------+ SFJ      Full                                                             +---------+---------------+---------+-----------+----------+-------------------+  inflammatory changes. Spleen: Normal in size without focal abnormality. Adrenals/Urinary Tract: Adrenal glands are within normal limits. Kidneys are well visualized bilaterally. No renal calculi or obstructive changes are seen. The ureters are within normal limits. The bladder is decompressed. Stomach/Bowel: No obstructive or inflammatory changes of the colon are noted. The appendix has been surgically removed. Small bowel and stomach are unremarkable. Vascular/Lymphatic: Aortic atherosclerosis. No enlarged abdominal or pelvic lymph nodes. Right femoral central venous line is noted extending into the IVC. Reproductive: Prostate is unremarkable. Other: No abdominal wall hernia or abnormality. No abdominopelvic ascites. Musculoskeletal: No acute or significant osseous findings. IMPRESSION: CT of the chest: Multiple bilateral rib fractures are noted without complicating factors. Moderate to large bilateral pleural effusions with associated atelectatic changes. Aortic Atherosclerosis (ICD10-I70.0) and Emphysema (ICD10-J43.9). CT of the abdomen and pelvis: No acute abnormality noted. Electronically Signed   By: Alcide Clever M.D.   On: 08/02/2023 23:06   DG Abd 1 View  Result Date: 07/30/2023 CLINICAL DATA:  OG tube placement EXAM: ABDOMEN - 1 VIEW COMPARISON:  Antonio [name] Available. FINDINGS:  Enteric tube terminates in the gastric cardia. Nonobstructive bowel gas pattern. Mild degenerative changes of the lumbar spine. IMPRESSION: Enteric tube terminates in the gastric cardia. Electronically Signed   By: Charline Bills M.D.   On: 08/12/2023 22:00   CARDIAC CATHETERIZATION  Result Date: 07/30/2023   Successful placement of transvemous pacer from the right femoral vein. Continue supportive efforts to treat his critical illness.    DG Chest Portable 1 View  Result Date: 08/04/2023 CLINICAL DATA:  Cardiac arrest, post intubation EXAM: PORTABLE CHEST 1 VIEW COMPARISON:  02/03/2022 FINDINGS: Mild right lobe scarring/atelectasis. No pleural effusion or pneumothorax. Cardiomegaly. Endotracheal tube terminates 7 cm above the carina. IMPRESSION: Endotracheal tube terminates 7 cm above the carina. Electronically Signed   By: Charline Bills M.D.   On: 08/05/2023 17:17   VAS [place] LOWER EXTREMITY VENOUS (DVT) (7a-7p)  Result Date: 07/29/2023  Lower Venous DVT Study Patient Name:  Antonio [name] [facility].  Date of Exam:   08/08/2023 Medical Rec #: [id number]               Accession #:    [id number] Date of Birth: [birth year]               Patient Gender: M Patient Age:   67 years Exam Location:  [facility] Procedure:      VAS [place] LOWER EXTREMITY VENOUS (DVT) Referring Phys: Lorin Picket [place] --------------------------------------------------------------------------------  Indications: Pain and swelling in right calf for several months. History of severe PVD.  Risk Factors: Surgery left AKA. Performing Technologist: Marilynne Halsted RDMS, RVT  Examination Guidelines: A complete evaluation includes B-mode imaging, spectral Doppler, color Doppler, and power Doppler as needed of all accessible portions of each vessel. Bilateral testing is considered an integral part of a complete examination. Limited examinations for reoccurring indications may be performed as noted. The reflux portion of the exam is performed  with the patient in reverse Trendelenburg.  +---------+---------------+---------+-----------+----------+-------------------+ RIGHT    CompressibilityPhasicitySpontaneityPropertiesThrombus Aging      +---------+---------------+---------+-----------+----------+-------------------+ CFV      Full           Yes      Yes                                      +---------+---------------+---------+-----------+----------+-------------------+ SFJ      Full                                                             +---------+---------------+---------+-----------+----------+-------------------+  inflammatory changes. Spleen: Normal in size without focal abnormality. Adrenals/Urinary Tract: Adrenal glands are within normal limits. Kidneys are well visualized bilaterally. No renal calculi or obstructive changes are seen. The ureters are within normal limits. The bladder is decompressed. Stomach/Bowel: No obstructive or inflammatory changes of the colon are noted. The appendix has been surgically removed. Small bowel and stomach are unremarkable. Vascular/Lymphatic: Aortic atherosclerosis. No enlarged abdominal or pelvic lymph nodes. Right femoral central venous line is noted extending into the IVC. Reproductive: Prostate is unremarkable. Other: No abdominal wall hernia or abnormality. No abdominopelvic ascites. Musculoskeletal: No acute or significant osseous findings. IMPRESSION: CT of the chest: Multiple bilateral rib fractures are noted without complicating factors. Moderate to large bilateral pleural effusions with associated atelectatic changes. Aortic Atherosclerosis (ICD10-I70.0) and Emphysema (ICD10-J43.9). CT of the abdomen and pelvis: No acute abnormality noted. Electronically Signed   By: Alcide Clever M.D.   On: 08/24/2023 23:06   DG Abd 1 View  Result Date: 08/05/2023 CLINICAL DATA:  OG tube placement EXAM: ABDOMEN - 1 VIEW COMPARISON:  Antonio [name] Available. FINDINGS:  Enteric tube terminates in the gastric cardia. Nonobstructive bowel gas pattern. Mild degenerative changes of the lumbar spine. IMPRESSION: Enteric tube terminates in the gastric cardia. Electronically Signed   By: Charline Bills M.D.   On: 08/09/2023 22:00   CARDIAC CATHETERIZATION  Result Date: 08/13/2023   Successful placement of transvemous pacer from the right femoral vein. Continue supportive efforts to treat his critical illness.    DG Chest Portable 1 View  Result Date: 08/18/2023 CLINICAL DATA:  Cardiac arrest, post intubation EXAM: PORTABLE CHEST 1 VIEW COMPARISON:  02/03/2022 FINDINGS: Mild right lobe scarring/atelectasis. No pleural effusion or pneumothorax. Cardiomegaly. Endotracheal tube terminates 7 cm above the carina. IMPRESSION: Endotracheal tube terminates 7 cm above the carina. Electronically Signed   By: Charline Bills M.D.   On: 08/07/2023 17:17   VAS [place] LOWER EXTREMITY VENOUS (DVT) (7a-7p)  Result Date: 08/18/2023  Lower Venous DVT Study Patient Name:  Antonio [name] [facility].  Date of Exam:   08/22/2023 Medical Rec #: [id number]               Accession #:    [id number] Date of Birth: [birth year]               Patient Gender: M Patient Age:   67 years Exam Location:  [facility] Procedure:      VAS [place] LOWER EXTREMITY VENOUS (DVT) Referring Phys: Lorin Picket [place] --------------------------------------------------------------------------------  Indications: Pain and swelling in right calf for several months. History of severe PVD.  Risk Factors: Surgery left AKA. Performing Technologist: Marilynne Halsted RDMS, RVT  Examination Guidelines: A complete evaluation includes B-mode imaging, spectral Doppler, color Doppler, and power Doppler as needed of all accessible portions of each vessel. Bilateral testing is considered an integral part of a complete examination. Limited examinations for reoccurring indications may be performed as noted. The reflux portion of the exam is performed  with the patient in reverse Trendelenburg.  +---------+---------------+---------+-----------+----------+-------------------+ RIGHT    CompressibilityPhasicitySpontaneityPropertiesThrombus Aging      +---------+---------------+---------+-----------+----------+-------------------+ CFV      Full           Yes      Yes                                      +---------+---------------+---------+-----------+----------+-------------------+ SFJ      Full                                                             +---------+---------------+---------+-----------+----------+-------------------+  inflammatory changes. Spleen: Normal in size without focal abnormality. Adrenals/Urinary Tract: Adrenal glands are within normal limits. Kidneys are well visualized bilaterally. No renal calculi or obstructive changes are seen. The ureters are within normal limits. The bladder is decompressed. Stomach/Bowel: No obstructive or inflammatory changes of the colon are noted. The appendix has been surgically removed. Small bowel and stomach are unremarkable. Vascular/Lymphatic: Aortic atherosclerosis. No enlarged abdominal or pelvic lymph nodes. Right femoral central venous line is noted extending into the IVC. Reproductive: Prostate is unremarkable. Other: No abdominal wall hernia or abnormality. No abdominopelvic ascites. Musculoskeletal: No acute or significant osseous findings. IMPRESSION: CT of the chest: Multiple bilateral rib fractures are noted without complicating factors. Moderate to large bilateral pleural effusions with associated atelectatic changes. Aortic Atherosclerosis (ICD10-I70.0) and Emphysema (ICD10-J43.9). CT of the abdomen and pelvis: No acute abnormality noted. Electronically Signed   By: Alcide Clever M.D.   On: 08/24/2023 23:06   DG Abd 1 View  Result Date: 08/05/2023 CLINICAL DATA:  OG tube placement EXAM: ABDOMEN - 1 VIEW COMPARISON:  Antonio [name] Available. FINDINGS:  Enteric tube terminates in the gastric cardia. Nonobstructive bowel gas pattern. Mild degenerative changes of the lumbar spine. IMPRESSION: Enteric tube terminates in the gastric cardia. Electronically Signed   By: Charline Bills M.D.   On: 08/09/2023 22:00   CARDIAC CATHETERIZATION  Result Date: 08/13/2023   Successful placement of transvemous pacer from the right femoral vein. Continue supportive efforts to treat his critical illness.    DG Chest Portable 1 View  Result Date: 08/18/2023 CLINICAL DATA:  Cardiac arrest, post intubation EXAM: PORTABLE CHEST 1 VIEW COMPARISON:  02/03/2022 FINDINGS: Mild right lobe scarring/atelectasis. No pleural effusion or pneumothorax. Cardiomegaly. Endotracheal tube terminates 7 cm above the carina. IMPRESSION: Endotracheal tube terminates 7 cm above the carina. Electronically Signed   By: Charline Bills M.D.   On: 08/07/2023 17:17   VAS [place] LOWER EXTREMITY VENOUS (DVT) (7a-7p)  Result Date: 08/18/2023  Lower Venous DVT Study Patient Name:  Antonio [name] [facility].  Date of Exam:   08/22/2023 Medical Rec #: [id number]               Accession #:    [id number] Date of Birth: [birth year]               Patient Gender: M Patient Age:   67 years Exam Location:  [facility] Procedure:      VAS [place] LOWER EXTREMITY VENOUS (DVT) Referring Phys: Lorin Picket [place] --------------------------------------------------------------------------------  Indications: Pain and swelling in right calf for several months. History of severe PVD.  Risk Factors: Surgery left AKA. Performing Technologist: Marilynne Halsted RDMS, RVT  Examination Guidelines: A complete evaluation includes B-mode imaging, spectral Doppler, color Doppler, and power Doppler as needed of all accessible portions of each vessel. Bilateral testing is considered an integral part of a complete examination. Limited examinations for reoccurring indications may be performed as noted. The reflux portion of the exam is performed  with the patient in reverse Trendelenburg.  +---------+---------------+---------+-----------+----------+-------------------+ RIGHT    CompressibilityPhasicitySpontaneityPropertiesThrombus Aging      +---------+---------------+---------+-----------+----------+-------------------+ CFV      Full           Yes      Yes                                      +---------+---------------+---------+-----------+----------+-------------------+ SFJ      Full                                                             +---------+---------------+---------+-----------+----------+-------------------+  inflammatory changes. Spleen: Normal in size without focal abnormality. Adrenals/Urinary Tract: Adrenal glands are within normal limits. Kidneys are well visualized bilaterally. No renal calculi or obstructive changes are seen. The ureters are within normal limits. The bladder is decompressed. Stomach/Bowel: No obstructive or inflammatory changes of the colon are noted. The appendix has been surgically removed. Small bowel and stomach are unremarkable. Vascular/Lymphatic: Aortic atherosclerosis. No enlarged abdominal or pelvic lymph nodes. Right femoral central venous line is noted extending into the IVC. Reproductive: Prostate is unremarkable. Other: No abdominal wall hernia or abnormality. No abdominopelvic ascites. Musculoskeletal: No acute or significant osseous findings. IMPRESSION: CT of the chest: Multiple bilateral rib fractures are noted without complicating factors. Moderate to large bilateral pleural effusions with associated atelectatic changes. Aortic Atherosclerosis (ICD10-I70.0) and Emphysema (ICD10-J43.9). CT of the abdomen and pelvis: No acute abnormality noted. Electronically Signed   By: Alcide Clever M.D.   On: 08/02/2023 23:06   DG Abd 1 View  Result Date: 08/22/2023 CLINICAL DATA:  OG tube placement EXAM: ABDOMEN - 1 VIEW COMPARISON:  Antonio [name] Available. FINDINGS:  Enteric tube terminates in the gastric cardia. Nonobstructive bowel gas pattern. Mild degenerative changes of the lumbar spine. IMPRESSION: Enteric tube terminates in the gastric cardia. Electronically Signed   By: Charline Bills M.D.   On: 08/02/2023 22:00   CARDIAC CATHETERIZATION  Result Date: 08/24/2023   Successful placement of transvemous pacer from the right femoral vein. Continue supportive efforts to treat his critical illness.    DG Chest Portable 1 View  Result Date: 08/20/2023 CLINICAL DATA:  Cardiac arrest, post intubation EXAM: PORTABLE CHEST 1 VIEW COMPARISON:  02/03/2022 FINDINGS: Mild right lobe scarring/atelectasis. No pleural effusion or pneumothorax. Cardiomegaly. Endotracheal tube terminates 7 cm above the carina. IMPRESSION: Endotracheal tube terminates 7 cm above the carina. Electronically Signed   By: Charline Bills M.D.   On: 08/08/2023 17:17   VAS [place] LOWER EXTREMITY VENOUS (DVT) (7a-7p)  Result Date: 08/17/2023  Lower Venous DVT Study Patient Name:  Antonio [name] [facility].  Date of Exam:   08/16/2023 Medical Rec #: [id number]               Accession #:    [id number] Date of Birth: [birth year]               Patient Gender: M Patient Age:   67 years Exam Location:  [facility] Procedure:      VAS [place] LOWER EXTREMITY VENOUS (DVT) Referring Phys: Lorin Picket [place] --------------------------------------------------------------------------------  Indications: Pain and swelling in right calf for several months. History of severe PVD.  Risk Factors: Surgery left AKA. Performing Technologist: Marilynne Halsted RDMS, RVT  Examination Guidelines: A complete evaluation includes B-mode imaging, spectral Doppler, color Doppler, and power Doppler as needed of all accessible portions of each vessel. Bilateral testing is considered an integral part of a complete examination. Limited examinations for reoccurring indications may be performed as noted. The reflux portion of the exam is performed  with the patient in reverse Trendelenburg.  +---------+---------------+---------+-----------+----------+-------------------+ RIGHT    CompressibilityPhasicitySpontaneityPropertiesThrombus Aging      +---------+---------------+---------+-----------+----------+-------------------+ CFV      Full           Yes      Yes                                      +---------+---------------+---------+-----------+----------+-------------------+ SFJ      Full                                                             +---------+---------------+---------+-----------+----------+-------------------+

## 2023-08-16 NOTE — Progress Notes (Signed)
LTM maint complete - no leads to repair. Placed pt back on server

## 2023-08-16 NOTE — Progress Notes (Signed)
Nutrition Follow-up  DOCUMENTATION CODES:   Non-severe (moderate) malnutrition in context of chronic illness  INTERVENTION:   Tube Feeding via OG: Vital 1.5 at 50 ml/hr with Pro-Source TF20 60 mL BID Begin TF at rate of 20 ml/hr; titrate by 10 mL q 8 hours until goal rate of 50 ml/hr TF at goal provides 1960 kcals, 121 g of protein and 912 mL of free water  Add Renal MVI daily, Vitamin C 250 mg BID per tube   NUTRITION DIAGNOSIS:   Moderate Malnutrition related to chronic illness as evidenced by mild muscle depletion, mild fat depletion.   GOAL:   Patient will meet greater than or equal to 90% of their needs  MONITOR:   Vent status, Labs, Weight trends, TF tolerance, Skin  REASON FOR ASSESSMENT:   Consult, Ventilator Enteral/tube feeding initiation and management  ASSESSMENT:   67 yo male presented to ED on 9/19 with UTI symptoms; while in ED, pt with PEA arrest requiring intubation. Pt with complete heart block and cardiogenic shock. PMH includes DM, HTN, HLD, CAD, GERD, L BKA, R transmetatarsal amputation with hx of osteomyelitis and right iliofemoral endarterectomy, hx of trach/PEG tube, +smoker (tobacco use)  Pt remains on vent support; CT with significant emphysema on exam.  Off pressors, sedated.  Propofol: 8.4 ml/hr (provides 222 kcals at current rate over 24 hours)  WBC trending down, Lactic Acid also trending down  OG tip in stomach  RD notes open wounds on RLE below the knee, noted pt has been reporting progressively worsening pain to RLE over the last 4-5 months. Noted plan for vascular surgery consult  Anuric AKI (making small amount of urine); Mild hyperkalemia, received lokelma.   Labs: potassium 5.7 (H), phosphorus 5.1 (H), magnesium 2.8 (H), HgbA1c 6.6 Meds: solumedrol, lokelma, rena-vite   NUTRITION - FOCUSED PHYSICAL EXAM:  Flowsheet Row Most Recent Value  Orbital Region Mild depletion  Upper Arm Region Unable to assess  Thoracic and Lumbar  Region Unable to assess  Buccal Region Mild depletion  Temple Region Moderate depletion  Clavicle Bone Region Mild depletion  Clavicle and Acromion Bone Region Mild depletion  Scapular Bone Region Mild depletion  Dorsal Hand Unable to assess  Patellar Region Moderate depletion  Anterior Thigh Region Moderate depletion  Posterior Calf Region Unable to assess  [L BKA, Right leg with swelling, ?cellulitis?]  Edema (RD Assessment) Mild        Diet Order:   Diet Order             Diet NPO time specified  Diet effective now                   EDUCATION NEEDS:   Not appropriate for education at this time  Skin:  Skin Assessment: Skin Integrity Issues: Skin Integrity Issues:: Other (Comment) Other: noted open wounds on RLE  Last BM:  PTA  Height:   Ht Readings from Last 1 Encounters:  08/17/2023 6' (1.829 m)    Weight:   Wt Readings from Last 1 Encounters:  08/16/23 102.9 kg      BMI:  Body mass index is 30.77 kg/m.  Estimated Nutritional Needs:   Kcal:  1900-2200 kcals  Protein:  115-135 g  Fluid:  1L plus UOP   [name] MS, RDN, LDN, CNSC Registered Dietitian 3 Clinical Nutrition RD Pager and On-Call Pager Number Located in [place]

## 2023-08-17 ENCOUNTER — Inpatient Hospital Stay (HOSPITAL_COMMUNITY): Payer: Medicare Other

## 2023-08-17 DIAGNOSIS — I5082 Biventricular heart failure: Secondary | ICD-10-CM | POA: Diagnosis not present

## 2023-08-17 DIAGNOSIS — R569 Unspecified convulsions: Secondary | ICD-10-CM | POA: Diagnosis not present

## 2023-08-17 DIAGNOSIS — I442 Atrioventricular block, complete: Secondary | ICD-10-CM | POA: Diagnosis not present

## 2023-08-17 DIAGNOSIS — I469 Cardiac arrest, cause unspecified: Secondary | ICD-10-CM | POA: Diagnosis not present

## 2023-08-17 LAB — BASIC METABOLIC PANEL
Anion gap: 14 (ref 5–15)
BUN: 57 mg/dL — ABNORMAL HIGH (ref 8–23)
CO2: 22 mmol/L (ref 22–32)
Calcium: 7.9 mg/dL — ABNORMAL LOW (ref 8.9–10.3)
Chloride: 106 mmol/L (ref 98–111)
Creatinine, Ser: 4.79 mg/dL — ABNORMAL HIGH (ref 0.61–1.24)
GFR, Estimated: 13 mL/min — ABNORMAL LOW (ref >60)
Glucose, Bld: 352 mg/dL — ABNORMAL HIGH (ref 70–99)
Potassium: 4.9 mmol/L (ref 3.5–5.1)
Sodium: 142 mmol/L (ref 135–145)

## 2023-08-17 LAB — URINE CULTURE

## 2023-08-17 LAB — CBC
HCT: 42.2 % (ref 39.0–52.0)
Hemoglobin: 12.6 g/dL — ABNORMAL LOW (ref 13.0–17.0)
MCH: 25 pg — ABNORMAL LOW (ref 26.0–34.0)
MCHC: 29.9 g/dL — ABNORMAL LOW (ref 30.0–36.0)
MCV: 83.7 fL (ref 80.0–100.0)
Platelets: 149 10*3/uL — ABNORMAL LOW (ref 150–400)
RBC: 5.04 MIL/uL (ref 4.22–5.81)
RDW: 15.4 % (ref 11.5–15.5)
WBC: 15.9 10*3/uL — ABNORMAL HIGH (ref 4.0–10.5)
nRBC: 0 % (ref 0.0–0.2)

## 2023-08-17 LAB — HEPARIN LEVEL (UNFRACTIONATED)
Heparin Unfractionated: 0.18 IU/mL — ABNORMAL LOW (ref 0.30–0.70)
Heparin Unfractionated: 0.43 IU/mL (ref 0.30–0.70)

## 2023-08-17 LAB — GLUCOSE, CAPILLARY
Glucose-Capillary: 245 mg/dL — ABNORMAL HIGH (ref 70–99)
Glucose-Capillary: 291 mg/dL — ABNORMAL HIGH (ref 70–99)
Glucose-Capillary: 232 mg/dL — ABNORMAL HIGH (ref 70–99)
Glucose-Capillary: 127 mg/dL — ABNORMAL HIGH (ref 70–99)
Glucose-Capillary: 133 mg/dL — ABNORMAL HIGH (ref 70–99)
Glucose-Capillary: 134 mg/dL — ABNORMAL HIGH (ref 70–99)

## 2023-08-17 LAB — TROPONIN I (HIGH SENSITIVITY): Troponin I (High Sensitivity): >24000 ng/L (ref <18)

## 2023-08-17 LAB — POTASSIUM
Potassium: 5.3 mmol/L — ABNORMAL HIGH (ref 3.5–5.1)
Potassium: 5.2 mmol/L — ABNORMAL HIGH (ref 3.5–5.1)

## 2023-08-17 LAB — TRIGLYCERIDES: Triglycerides: 117 mg/dL (ref <150)

## 2023-08-17 MED ORDER — SODIUM ZIRCONIUM CYCLOSILICATE 10 G PO PACK
10.0000 g | PACK | Freq: Once | ORAL | Status: AC
  Administered 2023-08-17: 10 g
  Filled 2023-08-17: qty 1

## 2023-08-17 MED ORDER — FUROSEMIDE 10 MG/ML IJ SOLN
60.0000 mg | Freq: Three times a day (TID) | INTRAMUSCULAR | Status: DC
  Administered 2023-08-17 – 2023-08-18 (×3): 60 mg via INTRAVENOUS
  Filled 2023-08-17 (×3): qty 6

## 2023-08-17 MED ORDER — SODIUM BICARBONATE 8.4 % IV SOLN
50.0000 meq | Freq: Once | INTRAVENOUS | Status: AC
  Administered 2023-08-17: 50 meq via INTRAVENOUS
  Filled 2023-08-17: qty 50

## 2023-08-17 MED ORDER — HYDRALAZINE HCL 20 MG/ML IJ SOLN
10.0000 mg | Freq: Four times a day (QID) | INTRAMUSCULAR | Status: DC | PRN
  Administered 2023-08-17 – 2023-08-19 (×2): 10 mg via INTRAVENOUS
  Filled 2023-08-17 (×3): qty 1

## 2023-08-17 MED ORDER — DEXTROSE 50 % IV SOLN
1.0000 | Freq: Once | INTRAVENOUS | Status: AC
  Administered 2023-08-17: 50 mL via INTRAVENOUS
  Filled 2023-08-17: qty 50

## 2023-08-17 MED ORDER — INSULIN GLARGINE-YFGN 100 UNIT/ML ~~LOC~~ SOLN
10.0000 [IU] | Freq: Every day | SUBCUTANEOUS | Status: DC
  Administered 2023-08-17: 10 [IU] via SUBCUTANEOUS
  Filled 2023-08-17 (×3): qty 0.1

## 2023-08-17 MED ORDER — CALCIUM GLUCONATE-NACL 1-0.675 GM/50ML-% IV SOLN
1.0000 g | Freq: Once | INTRAVENOUS | Status: AC
  Administered 2023-08-17: 1000 mg via INTRAVENOUS
  Filled 2023-08-17: qty 50

## 2023-08-17 MED ORDER — HYDRALAZINE HCL 10 MG PO TABS: 10.0000 mg | ORAL_TABLET | Freq: Four times a day (QID) | ORAL | Status: DC | PRN

## 2023-08-17 MED ORDER — CLEVIDIPINE BUTYRATE 0.5 MG/ML IV EMUL
0.0000 mg/h | INTRAVENOUS | Status: DC
  Administered 2023-08-17: 2 mg/h via INTRAVENOUS
  Filled 2023-08-17: qty 100

## 2023-08-17 MED ORDER — CALCIUM CHLORIDE 10 % IV SOLN
INTRAVENOUS | Status: AC
  Filled 2023-08-17: qty 10

## 2023-08-17 MED ORDER — CALCIUM GLUCONATE 10 % IV SOLN: 1.0000 g | Freq: Once | INTRAVENOUS | Status: DC

## 2023-08-17 MED ORDER — HEPARIN BOLUS VIA INFUSION
1500.0000 [IU] | Freq: Once | INTRAVENOUS | Status: AC
  Administered 2023-08-17: 1500 [IU] via INTRAVENOUS
  Filled 2023-08-17: qty 1500

## 2023-08-17 MED ORDER — INSULIN ASPART 100 UNIT/ML IV SOLN
10.0000 [IU] | Freq: Once | INTRAVENOUS | Status: AC
  Administered 2023-08-17: 10 [IU] via INTRAVENOUS

## 2023-08-17 MED ORDER — FAMOTIDINE 20 MG PO TABS: 20.0000 mg | ORAL_TABLET | Freq: Every day | ORAL | Status: DC

## 2023-08-17 MED ORDER — DEXMEDETOMIDINE HCL IN NACL 400 MCG/100ML IV SOLN
0.0000 ug/kg/h | INTRAVENOUS | Status: DC
  Administered 2023-08-17: 1.1 ug/kg/h via INTRAVENOUS
  Administered 2023-08-17: 0.7 ug/kg/h via INTRAVENOUS
  Administered 2023-08-17: 1.2 ug/kg/h via INTRAVENOUS
  Administered 2023-08-18: 1 ug/kg/h via INTRAVENOUS
  Administered 2023-08-18: 1.2 ug/kg/h via INTRAVENOUS
  Administered 2023-08-18: 0.8 ug/kg/h via INTRAVENOUS
  Administered 2023-08-18 (×3): 1.1 ug/kg/h via INTRAVENOUS
  Administered 2023-08-19: 0.3 ug/kg/h via INTRAVENOUS
  Administered 2023-08-19: 0.8 ug/kg/h via INTRAVENOUS
  Administered 2023-08-19: 0.5 ug/kg/h via INTRAVENOUS
  Administered 2023-08-20: 0.3 ug/kg/h via INTRAVENOUS
  Filled 2023-08-17: qty 100
  Filled 2023-08-17: qty 200
  Filled 2023-08-17 (×2): qty 100
  Filled 2023-08-17: qty 200
  Filled 2023-08-17 (×7): qty 100

## 2023-08-17 MED ORDER — CALCIUM CHLORIDE 10 % IV SOLN: 1.0000 g | Freq: Once | INTRAVENOUS | Status: DC

## 2023-08-17 NOTE — Progress Notes (Addendum)
ANTICOAGULATION CONSULT NOTE  Pharmacy Consult for heparin Indication: chest pain/ACS  Allergies  Allergen Reactions   Iodinated Contrast Media Hives, Rash and Other (See Comments)    Blisters Staph Blisters / staph   Penicillins Other (See Comments)    UNSPECIFIED REACTION  Note - tolerated ceftriaxone 08/16/23    Patient Measurements: Height: 6' (182.9 cm) Weight: 106.6 kg (235 lb 0.2 oz) IBW/kg (Calculated) : 77.6 Heparin Dosing Weight: 100kg  Vital Signs: Temp: 96.8 F (36 C) (09/21 1100) Temp Source: Esophageal (09/21 0800) BP: 125/76 (09/21 1100) Pulse Rate: 79 (09/21 1100)  Labs: Recent Labs    08/03/2023 2110 08/16/23 0319 08/16/23 0350 08/16/23 0428 08/16/23 1333 08/16/23 1901 08/16/23 2328 08/17/23 0157 08/17/23 0946 08/17/23 0947  HGB 13.8  --    < > 13.8 12.8*  --   --   --  12.6*  --   HCT 46.8  --    < > 45.9 42.7  --   --   --  42.2  --   PLT 391  --   --  328 186  --   --   --  149*  --   HEPARINUNFRC  --   --   --   --   --  0.15*  --  0.18*  --  0.43  CREATININE 2.83* 3.07*  --   --   --   --  4.49* 4.79*  --   --   TROPONINIHS 6,747* >24,000*  --   --   --   --   --   --   --   --    < > = values in this interval not displayed.    Estimated Creatinine Clearance: 19.4 mL/min (A) (by C-G formula based on SCr of 4.79 mg/dL (H)).   Medical History: Past Medical History:  Diagnosis Date   Coronary artery disease    Diabetes mellitus without complication (HCC)    typ2   GERD (gastroesophageal reflux disease)    Hyperlipidemia    Hypertension    Osteomyelitis (HCC) 03/25/2017   RT FOOT     Assessment: 30 yoM admitted with urinary symptoms. Pt subsequently had PEA arrest and noted to have AV block with elevated troponins. Pharmacy to start IV heparin for ACS r/o. CBC ok, no AC PTA.   Heparin level therapeutic at 0.43. H/H ok.  Goal of Therapy:  Heparin level 0.3-0.7 units/ml Monitor platelets by anticoagulation protocol: Yes   Plan:   Heparin 1850 units/h Daily heparin level, CBC  Fredonia Highland, PharmD, [place], [facility] Clinical Pharmacist [phone] Please check AMION for all [facility] numbers 08/17/2023

## 2023-08-17 NOTE — Progress Notes (Signed)
EEG maint complete.  ?

## 2023-08-17 NOTE — Procedures (Signed)
Extubation Procedure Note  Patient Details:   Name: Antonio [name]. DOB: 11/16/1956 MRN: [id number]   Airway Documentation:    Vent end date: 08/17/23 Vent end time: 1132   Evaluation  O2 sats: stable throughout Complications: No apparent complications Patient did tolerate procedure well. Bilateral Breath Sounds: Clear, Diminished   Yes  Pt extubated to 3L per MD at bedside. Cuff leak present, No stridor noted, RN at bedside, CCM at bedside, RT will monitor as needed.   Thornell Mule 08/17/2023, 12:32 PM

## 2023-08-17 NOTE — Procedures (Signed)
Patient Name: Farooq Marceleno [facility].  MRN: [id number]  Epilepsy Attending: Charlsie Quest  Referring Physician/Provider: Cheri Fowler, MD  Duration: 08/17/2023 0025 to 08/17/2023 19147   Patient history: 67yo M s/p cardiac arrest getting eeg to evaluate for seizure   Level of alertness: comatose   AEDs during EEG study: Versed, propofol   Technical aspects: This EEG study was done with scalp electrodes positioned according to the 10-20 International system of electrode placement. Electrical activity was reviewed with band pass filter of 1-70Hz , sensitivity of 7 uV/mm, display speed of 41mm/sec with a 60Hz  notched filter applied as appropriate. EEG data were recorded continuously and digitally stored.  Video monitoring was available and reviewed as appropriate.   Description: EEG showed continuous generalized 3 to 6 Hz theta-delta slowing admixed with an excessive amount of 15 to 18 Hz beta activity distributed symmetrically and diffusely. Hyperventilation and photic stimulation were not performed.      ABNORMALITY - Continuous slow, generalized - Excessive beta, generalized   IMPRESSION: This study is suggestive of severe diffuse encephalopathy likely related to sedation. No seizures or epileptiform discharges were seen throughout the recording.   Pryor Guettler Annabelle Harman

## 2023-08-17 NOTE — Progress Notes (Signed)
NAME:  Antonio [name]., MRN:  [id number], DOB:  [birth year], LOS: 2 ADMISSION DATE:  08/13/2023, CONSULTATION DATE:  9/19 REFERRING MD:  Dr. Criss Alvine, CHIEF COMPLAINT:  cardiac arrest   History of Present Illness:  Patient is a 67 yo M w/ pertinent PMH of HTN, CAD s/p stent placement, PAD s/p L BKA and LSFA stenting 2013, T2DM, right foot osteomyelitis s/p right iliofemoral endarterectomy and right metatarsal amputation presents to [facility] ED on 9/19 w/ UTI.  Patient having UTI symptoms for the past 4-5 days. Also having b/l back pain and abd pain. Also having chronic right leg pain that has been progressively worsening over the last 4-5 months. On arrival patient alert and hypertensive 162/120. Afebrile and wbc 7.8. UA w/ leukocytes and uc pending. Started on rocephin. Patient also w/ creat 2.32. Patient taken to vascular lab and dvt US with no evidence of dvt and arterial duplex showing 75-99% stenosis in deep femoral artery and 50-74% stenosis in superficial femoral artery recommending vascular consult.   While in ED patient found became unresponsive w/ clinched teeth. Patient was pulseless and cpr started w/ rosc in 2 minutes. Attempted to intubated after code and very difficult airway and initially could not pass ETT. Patient coded during intubation procedure. 6.5 ETT was able to be placed and ROSC was achieved. Patient on levo and epi. Patient hr 50s. EKG showing complete AV block. Cards consulted and taking for emergent cath. PCCM consulted for icu admission.  Pertinent  Medical History   Past Medical History:  Diagnosis Date   Coronary artery disease    Diabetes mellitus without complication (HCC)    typ2   GERD (gastroesophageal reflux disease)    Hyperlipidemia    Hypertension    Osteomyelitis (HCC) 03/25/2017   RT FOOT     Significant Hospital Events: Including procedures, antibiotic start and stop dates in addition to other pertinent events   9/19 admitted initially w/ UTI;  while in ED pea arrest, difficult intubation. Complete heart block going to cath lab  Interim History / Subjective:  Sedation being weaned. On cEEG  Objective   Blood pressure 97/62, pulse 86, temperature (!) 96.1 F (35.6 C), resp. rate (!) 30, height 6' (1.829 m), weight 106.6 kg, SpO2 94%.    Vent Mode: PRVC FiO2 (%):  [40 %-50 %] 50 % Set Rate:  [30 bmp] 30 bmp Vt Set:  [629 mL] 620 mL PEEP:  [5 cmH20] 5 cmH20 Plateau Pressure:  [20 cmH20-21 cmH20] 20 cmH20   Intake/Output Summary (Last 24 hours) at 08/17/2023 5284 Last data filed at 08/17/2023 0700 Gross per 24 hour  Intake 2597.17 ml  Output 270 ml  Net 2327.17 ml   Filed Weights   08/16/23 0500 08/17/23 0500  Weight: 102.9 kg 106.6 kg    Examination: Chronically ill Moves to command Minimal edema Bronchospasm on vent improved Ext warm  Renal function a little worse K ok Hyperglycemic  Resolved Hospital Problem list     Assessment & Plan:   Inferior MI with PEA arrest- brief, seems neuro intact; tolerated ceftriaxone challenge, this was not allergic reaction Acute respiratory failure w/ hypoxia and hypercapnia- significant emphysema noted on CT Difficult airway- 6-0 tube in place CPR- induced rib fractures UTI AKI- still an issue, no obstruction on CT CAD HTN HLD PAD T2DM w/ hyperglycemia  - Avoid nephrotoxins - Steroids, bronchodilators as ordered - SAT/SBT and consider extubation - Eventual need for LHC and GDMT depending on renal recovery if  it occurs - Eventual need for VVS conslt for R femoral atherosclerotic disease, with AKI doubt anything can be done at present - GDMT per cardiology - Updated son by phone 9/20  Best Practice (right click and "Reselect all SmartList Selections" daily)   Diet/type: start TF DVT prophylaxis: heparin gtt GI prophylaxis: H2B Lines: N/A Foley:  yes Code Status:  full code Last date of multidisciplinary goals of care discussion [updated son 9/20]  38 min  cc time Myrla Halsted MD PCCM [facility] 08/17/2023, 7:42 AM  Please see [email or web address] for pager details.  From 7A-7P if no response, please call [phone]. After hours, please call ELink [phone].

## 2023-08-17 NOTE — Progress Notes (Signed)
Pt taken off BiPAP and placed on 3L [place]. Pt tolerating well, No increased WOB noted, SPO2 94% vitals stable,CCM aware, RN aware, RT will monitor as needed.     08/17/23 1220  Therapy Vitals  Pulse Rate 92  Resp (!) 23  MEWS Score/Color  MEWS Score 3  MEWS Score Color Yellow  Respiratory Assessment  Assessment Type Assess only  Respiratory Pattern Regular;Unlabored  Chest Assessment Chest expansion symmetrical  Oxygen Therapy/Pulse Ox  O2 Device Nasal Cannula  O2 Flow Rate (L/min) 3 L/min  SpO2 94 %

## 2023-08-17 NOTE — Progress Notes (Signed)
Pt placed on BiPAP by RT per CCM MD at bedside. Pt tolerating well at this time, vitals stable, no increased WOB noted, RN at bedside, RT will monitor as needed.     08/17/23 1137  Therapy Vitals  Pulse Rate 86  Resp (!) 24  MEWS Score/Color  MEWS Score 4  MEWS Score Color Red  Respiratory Assessment  Assessment Type Assess only  Respiratory Pattern Regular;Unlabored  Chest Assessment Chest expansion symmetrical  Cough Productive  Sputum Amount Moderate  Sputum Color Tan  Sputum Consistency Thick  Sputum Specimen Source Oral  Bilateral Breath Sounds Diminished;Rhonchi  Oxygen Therapy/Pulse Ox  O2 Device (S)  Bi-PAP  O2 Therapy Oxygen  FiO2 (%) 40 %  SpO2 98 %

## 2023-08-17 NOTE — Progress Notes (Signed)
LTM maint complete - no skin breakdown under: Fp1 Fp2 F3 [facility] monitored, Event button test confirmed by [facility].

## 2023-08-17 NOTE — Plan of Care (Signed)
  Problem: Education: Goal: Understanding of CV disease, CV risk reduction, and recovery process will improve Outcome: Not Applicable Goal: Individualized Educational Video(s) Outcome: Not Applicable   Problem: Activity: Goal: Ability to return to baseline activity level will improve Outcome: Progressing   Problem: Cardiovascular: Goal: Ability to achieve and maintain adequate cardiovascular perfusion will improve Outcome: Progressing Goal: Vascular access site(s) Level 0-1 will be maintained Outcome: Progressing   Problem: Health Behavior/Discharge Planning: Goal: Ability to safely manage health-related needs after discharge will improve Outcome: Not Applicable   Problem: Education: Goal: Knowledge of General Education information will improve Description: Including pain rating scale, medication(s)/side effects and non-pharmacologic comfort measures Outcome: Not Applicable   Problem: Health Behavior/Discharge Planning: Goal: Ability to manage health-related needs will improve Outcome: Not Applicable   Problem: Clinical Measurements: Goal: Ability to maintain clinical measurements within normal limits will improve Outcome: Progressing Goal: Will remain free from infection Outcome: Progressing Goal: Diagnostic test results will improve Outcome: Progressing Goal: Respiratory complications will improve Outcome: Progressing Goal: Cardiovascular complication will be avoided Outcome: Progressing   Problem: Activity: Goal: Risk for activity intolerance will decrease Outcome: Progressing   Problem: Nutrition: Goal: Adequate nutrition will be maintained Outcome: Progressing   Problem: Coping: Goal: Level of anxiety will decrease Outcome: Progressing   Problem: Elimination: Goal: Will not experience complications related to bowel motility Outcome: Progressing Goal: Will not experience complications related to urinary retention Outcome: Progressing   Problem: Pain  Managment: Goal: General experience of comfort will improve Outcome: Progressing   Problem: Safety: Goal: Ability to remain free from injury will improve Outcome: Progressing   Problem: Skin Integrity: Goal: Risk for impaired skin integrity will decrease Outcome: Not Progressing   Problem: Safety: Goal: Non-violent Restraint(s) Outcome: Not Applicable   Problem: Education: Goal: Ability to describe self-care measures that may prevent or decrease complications (Diabetes Survival Skills Education) will improve Outcome: Not Applicable Goal: Individualized Educational Video(s) Outcome: Not Applicable   Problem: Coping: Goal: Ability to adjust to condition or change in health will improve Outcome: Not Progressing   Problem: Fluid Volume: Goal: Ability to maintain a balanced intake and output will improve Outcome: Progressing   Problem: Health Behavior/Discharge Planning: Goal: Ability to identify and utilize available resources and services will improve Outcome: Not Applicable Goal: Ability to manage health-related needs will improve Outcome: Not Applicable   Problem: Metabolic: Goal: Ability to maintain appropriate glucose levels will improve Outcome: Progressing   Problem: Nutritional: Goal: Maintenance of adequate nutrition will improve Outcome: Progressing Goal: Progress toward achieving an optimal weight will improve Outcome: Progressing   Problem: Skin Integrity: Goal: Risk for impaired skin integrity will decrease Outcome: Progressing   Problem: Tissue Perfusion: Goal: Adequacy of tissue perfusion will improve Outcome: Progressing

## 2023-08-17 NOTE — Progress Notes (Signed)
Right calf veins were technically difficult to visualized to exclude DVT.   *See table(s) above for measurements and observations. Electronically signed by Gerarda Fraction on 08/03/2023 at 4:17:32 PM.    Final    VAS [place] LOWER EXTREMITY ARTERIAL DUPLEX (7a-7p)  Result Date: 08/20/2023 LOWER EXTREMITY ARTERIAL DUPLEX STUDY Patient Name:  Antonio [name].  Date of Exam:   08/20/2023 Medical Rec #: [id number]               Accession #:    [id number] Date of Birth: 1956/04/08                Patient Gender: M Patient Age:   67 years Exam Location:  [name] Procedure:      VAS [place] LOWER EXTREMITY ARTERIAL DUPLEX Referring Phys: Lorin Picket GOLD[place]ON --------------------------------------------------------------------------------  Indications: Peripheral artery disease, and Patient presents with swelling and              severe pain in his right calf. History of known PVD. High Risk Factors: Hypertension, hyperlipidemia, Diabetes.  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                         RIGHT ILEOFEMORAL ENDARTERECTOMY 01/01/22. RIGHT GROIN                         DEBRIDEMENT 02/08/22. Current ABI:            NA Comparison       12/29/21 - Lower Extremity Angiography was performed. No prior Study:           LEAD. Performing Technologist: Marilynne Halsted RDMS, RVT  Examination Guidelines: A complete evaluation includes B-mode imaging, spectral Doppler, color Doppler, and power Doppler as needed of all accessible portions of each vessel. Bilateral testing is considered an integral part of a complete examination. Limited examinations for reoccurring indications may be performed as noted.  +----------+--------+-----+---------------+----------+----------------+ RIGHT     PSV cm/sRatioStenosis       Waveform  Comments         +----------+--------+-----+---------------+----------+----------------+ CFA Mid   75                          monophasic                 +----------+--------+-----+---------------+----------+----------------+ DFA       362          75-99% stenosisbiphasic                   +----------+--------+-----+---------------+----------+----------------+ SFA Prox  183          50-74% stenosismonophasic                 +----------+--------+-----+---------------+----------+----------------+ SFA Mid   120                         monophasic                 +----------+--------+-----+---------------+----------+----------------+  SFA Distal109                         monophasic                 +----------+--------+-----+---------------+----------+----------------+ POP Prox  41  Right calf veins were technically difficult to visualized to exclude DVT.   *See table(s) above for measurements and observations. Electronically signed by Gerarda Fraction on 08/03/2023 at 4:17:32 PM.    Final    VAS [place] LOWER EXTREMITY ARTERIAL DUPLEX (7a-7p)  Result Date: 08/20/2023 LOWER EXTREMITY ARTERIAL DUPLEX STUDY Patient Name:  Antonio [name].  Date of Exam:   08/20/2023 Medical Rec #: [id number]               Accession #:    [id number] Date of Birth: 1956/04/08                Patient Gender: M Patient Age:   67 years Exam Location:  [name] Procedure:      VAS [place] LOWER EXTREMITY ARTERIAL DUPLEX Referring Phys: Lorin Picket G[place] --------------------------------------------------------------------------------  Indications: Peripheral artery disease, and Patient presents with swelling and              severe pain in his right calf. History of known PVD. High Risk Factors: Hypertension, hyperlipidemia, Diabetes.  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                         RIGHT ILEOFEMORAL ENDARTERECTOMY 01/01/22. RIGHT GROIN                         DEBRIDEMENT 02/08/22. Current ABI:            NA Comparison       12/29/21 - Lower Extremity Angiography was performed. No prior Study:           LEAD. Performing Technologist: Marilynne Halsted RDMS, RVT  Examination Guidelines: A complete evaluation includes B-mode imaging, spectral Doppler, color Doppler, and power Doppler as needed of all accessible portions of each vessel. Bilateral testing is considered an integral part of a complete examination. Limited examinations for reoccurring indications may be performed as noted.  +----------+--------+-----+---------------+----------+----------------+ RIGHT     PSV cm/sRatioStenosis       Waveform  Comments         +----------+--------+-----+---------------+----------+----------------+ CFA Mid   75                          monophasic                 +----------+--------+-----+---------------+----------+----------------+ DFA       362          75-99% stenosisbiphasic                   +----------+--------+-----+---------------+----------+----------------+ SFA Prox  183          50-74% stenosismonophasic                 +----------+--------+-----+---------------+----------+----------------+ SFA Mid   120                         monophasic                 +----------+--------+-----+---------------+----------+----------------+  SFA Distal109                         monophasic                 +----------+--------+-----+---------------+----------+----------------+ POP Prox  41  Result Date: 08/16/2023    ECHOCARDIOGRAM REPORT   Patient Name:   Antonio [name]. Date of Exam: 08/16/2023 Medical Rec #:  [id number]              Height:       72.0 in Accession #:    [id number]             Weight:       226.9 lb Date of Birth:  11-07-1956              BSA:          2.248 m Patient Age:    67 years               BP:           109/80 mmHg Patient Gender: M                      HR:           90 bpm. Exam Location:  Inpatient Procedure: 2D Echo, Cardiac Doppler and Color Doppler Indications:    Cardiac arrest I46.9   History:        Patient has prior history of Echocardiogram examinations. CAD,                 PAD, Arrythmias:Heart Block; Risk Factors:Hypertension,                 Diabetes, Dyslipidemia and Current Smoker.  Sonographer:    Dondra Prader RVT RCS Referring Phys: [id number] Corky Crafts  Sonographer Comments: Technically challenging study due to limited acoustic windows. Supine position. IMPRESSIONS  1. Left ventricular ejection fraction, by estimation, is <20%. The left ventricle has severely decreased function. The left ventricle demonstrates global hypokinesis. There is mild asymmetric left ventricular hypertrophy of the basal-septal segment. Indeterminate diastolic filling due to E-A fusion.  2. Right ventricular systolic function is severely reduced. The right ventricular size is normal.  3. The mitral valve is normal in structure. Trivial mitral valve regurgitation. No evidence of mitral stenosis. Moderate mitral annular calcification.  4. 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 There is no significant stenosis by Doppler but given severe LV dysfunction suspect a component of at least mild low-flow low gradient AS.Marland Kitchen The aortic valve has an indeterminant number of cusps. There is moderate calcification of the aortic valve. Aortic valve regurgitation is not visualized. No aortic stenosis is present.  5. The inferior vena cava is normal in size with greater than 50% respiratory variability, suggesting right atrial pressure of 3 mmHg. Comparison(s): 02/04/22-Left ventricular ejection fraction, by estimation, is 55 to 60%. The left ventricle has normal function. The left ventricle has no regional wall motion abnormalities.  FINDINGS  Left Ventricle: Left ventricular ejection fraction, by estimation, is <20%. The left ventricle has severely decreased function. The left ventricle demonstrates global hypokinesis. The left ventricular internal  cavity size was normal in size. There is mild asymmetric left ventricular hypertrophy of the basal-septal segment. Indeterminate diastolic filling due to E-A fusion. Right Ventricle: The right ventricular size is normal. No increase in right ventricular wall thickness. Right ventricular systolic function is severely reduced. Left Atrium: Left atrial size was normal in size. Right Atrium: Right atrial size was normal in size. Pericardium: There is no evidence of pericardial effusion. Mitral Valve: The mitral valve is normal in structure. Moderate mitral annular calcification. Trivial mitral valve  Right calf veins were technically difficult to visualized to exclude DVT.   *See table(s) above for measurements and observations. Electronically signed by Gerarda Fraction on 07/30/2023 at 4:17:32 PM.    Final    VAS [place] LOWER EXTREMITY ARTERIAL DUPLEX (7a-7p)  Result Date: 08/05/2023 LOWER EXTREMITY ARTERIAL DUPLEX STUDY Patient Name:  Antonio [name].  Date of Exam:   08/07/2023 Medical Rec #: [id number]               Accession #:    [id number] Date of Birth: 1956/04/08                Patient Gender: M Patient Age:   67 years Exam Location:  [name] Procedure:      VAS [place] LOWER EXTREMITY ARTERIAL DUPLEX Referring Phys: Lorin Picket GOLD[place] --------------------------------------------------------------------------------  Indications: Peripheral artery disease, and Patient presents with swelling and              severe pain in his right calf. History of known PVD. High Risk Factors: Hypertension, hyperlipidemia, Diabetes.  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                         RIGHT ILEOFEMORAL ENDARTERECTOMY 01/01/22. RIGHT GROIN                         DEBRIDEMENT 02/08/22. Current ABI:            NA Comparison       12/29/21 - Lower Extremity Angiography was performed. No prior Study:           LEAD. Performing Technologist: Marilynne Halsted RDMS, RVT  Examination Guidelines: A complete evaluation includes B-mode imaging, spectral Doppler, color Doppler, and power Doppler as needed of all accessible portions of each vessel. Bilateral testing is considered an integral part of a complete examination. Limited examinations for reoccurring indications may be performed as noted.  +----------+--------+-----+---------------+----------+----------------+ RIGHT     PSV cm/sRatioStenosis       Waveform  Comments         +----------+--------+-----+---------------+----------+----------------+ CFA Mid   75                          monophasic                 +----------+--------+-----+---------------+----------+----------------+ DFA       362          75-99% stenosisbiphasic                   +----------+--------+-----+---------------+----------+----------------+ SFA Prox  183          50-74% stenosismonophasic                 +----------+--------+-----+---------------+----------+----------------+ SFA Mid   120                         monophasic                 +----------+--------+-----+---------------+----------+----------------+  SFA Distal109                         monophasic                 +----------+--------+-----+---------------+----------+----------------+ POP Prox  41  Right calf veins were technically difficult to visualized to exclude DVT.   *See table(s) above for measurements and observations. Electronically signed by Gerarda Fraction on 08/08/2023 at 4:17:32 PM.    Final    VAS [place] LOWER EXTREMITY ARTERIAL DUPLEX (7a-7p)  Result Date: 08/13/2023 LOWER EXTREMITY ARTERIAL DUPLEX STUDY Patient Name:  Antonio [name].  Date of Exam:   08/04/2023 Medical Rec #: [id number]               Accession #:    [id number] Date of Birth: 1956/04/08                Patient Gender: M Patient Age:   67 years Exam Location:  [name] Procedure:      VAS [place] LOWER EXTREMITY ARTERIAL DUPLEX Referring Phys: Lorin Picket GOLD[place] --------------------------------------------------------------------------------  Indications: Peripheral artery disease, and Patient presents with swelling and              severe pain in his right calf. History of known PVD. High Risk Factors: Hypertension, hyperlipidemia, Diabetes.  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                         RIGHT ILEOFEMORAL ENDARTERECTOMY 01/01/22. RIGHT GROIN                         DEBRIDEMENT 02/08/22. Current ABI:            NA Comparison       12/29/21 - Lower Extremity Angiography was performed. No prior Study:           LEAD. Performing Technologist: Marilynne Halsted RDMS, RVT  Examination Guidelines: A complete evaluation includes B-mode imaging, spectral Doppler, color Doppler, and power Doppler as needed of all accessible portions of each vessel. Bilateral testing is considered an integral part of a complete examination. Limited examinations for reoccurring indications may be performed as noted.  +----------+--------+-----+---------------+----------+----------------+ RIGHT     PSV cm/sRatioStenosis       Waveform  Comments         +----------+--------+-----+---------------+----------+----------------+ CFA Mid   75                          monophasic                 +----------+--------+-----+---------------+----------+----------------+ DFA       362          75-99% stenosisbiphasic                   +----------+--------+-----+---------------+----------+----------------+ SFA Prox  183          50-74% stenosismonophasic                 +----------+--------+-----+---------------+----------+----------------+ SFA Mid   120                         monophasic                 +----------+--------+-----+---------------+----------+----------------+  SFA Distal109                         monophasic                 +----------+--------+-----+---------------+----------+----------------+ POP Prox  41  Right calf veins were technically difficult to visualized to exclude DVT.   *See table(s) above for measurements and observations. Electronically signed by Gerarda Fraction on 08/08/2023 at 4:17:32 PM.    Final    VAS [place] LOWER EXTREMITY ARTERIAL DUPLEX (7a-7p)  Result Date: 08/13/2023 LOWER EXTREMITY ARTERIAL DUPLEX STUDY Patient Name:  Antonio [name].  Date of Exam:   08/04/2023 Medical Rec #: [id number]               Accession #:    [id number] Date of Birth: 1956/04/08                Patient Gender: M Patient Age:   67 years Exam Location:  [name] Procedure:      VAS [place] LOWER EXTREMITY ARTERIAL DUPLEX Referring Phys: Lorin Picket GOLD[place] --------------------------------------------------------------------------------  Indications: Peripheral artery disease, and Patient presents with swelling and              severe pain in his right calf. History of known PVD. High Risk Factors: Hypertension, hyperlipidemia, Diabetes.  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                         RIGHT ILEOFEMORAL ENDARTERECTOMY 01/01/22. RIGHT GROIN                         DEBRIDEMENT 02/08/22. Current ABI:            NA Comparison       12/29/21 - Lower Extremity Angiography was performed. No prior Study:           LEAD. Performing Technologist: Marilynne Halsted RDMS, RVT  Examination Guidelines: A complete evaluation includes B-mode imaging, spectral Doppler, color Doppler, and power Doppler as needed of all accessible portions of each vessel. Bilateral testing is considered an integral part of a complete examination. Limited examinations for reoccurring indications may be performed as noted.  +----------+--------+-----+---------------+----------+----------------+ RIGHT     PSV cm/sRatioStenosis       Waveform  Comments         +----------+--------+-----+---------------+----------+----------------+ CFA Mid   75                          monophasic                 +----------+--------+-----+---------------+----------+----------------+ DFA       362          75-99% stenosisbiphasic                   +----------+--------+-----+---------------+----------+----------------+ SFA Prox  183          50-74% stenosismonophasic                 +----------+--------+-----+---------------+----------+----------------+ SFA Mid   120                         monophasic                 +----------+--------+-----+---------------+----------+----------------+  SFA Distal109                         monophasic                 +----------+--------+-----+---------------+----------+----------------+ POP Prox  41  Result Date: 08/16/2023    ECHOCARDIOGRAM REPORT   Patient Name:   Antonio [name]. Date of Exam: 08/16/2023 Medical Rec #:  [id number]              Height:       72.0 in Accession #:    [id number]             Weight:       226.9 lb Date of Birth:  11-07-1956              BSA:          2.248 m Patient Age:    67 years               BP:           109/80 mmHg Patient Gender: M                      HR:           90 bpm. Exam Location:  Inpatient Procedure: 2D Echo, Cardiac Doppler and Color Doppler Indications:    Cardiac arrest I46.9   History:        Patient has prior history of Echocardiogram examinations. CAD,                 PAD, Arrythmias:Heart Block; Risk Factors:Hypertension,                 Diabetes, Dyslipidemia and Current Smoker.  Sonographer:    Dondra Prader RVT RCS Referring Phys: [id number] Corky Crafts  Sonographer Comments: Technically challenging study due to limited acoustic windows. Supine position. IMPRESSIONS  1. Left ventricular ejection fraction, by estimation, is <20%. The left ventricle has severely decreased function. The left ventricle demonstrates global hypokinesis. There is mild asymmetric left ventricular hypertrophy of the basal-septal segment. Indeterminate diastolic filling due to E-A fusion.  2. Right ventricular systolic function is severely reduced. The right ventricular size is normal.  3. The mitral valve is normal in structure. Trivial mitral valve regurgitation. No evidence of mitral stenosis. Moderate mitral annular calcification.  4. 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 There is no significant stenosis by Doppler but given severe LV dysfunction suspect a component of at least mild low-flow low gradient AS.Marland Kitchen The aortic valve has an indeterminant number of cusps. There is moderate calcification of the aortic valve. Aortic valve regurgitation is not visualized. No aortic stenosis is present.  5. The inferior vena cava is normal in size with greater than 50% respiratory variability, suggesting right atrial pressure of 3 mmHg. Comparison(s): 02/04/22-Left ventricular ejection fraction, by estimation, is 55 to 60%. The left ventricle has normal function. The left ventricle has no regional wall motion abnormalities.  FINDINGS  Left Ventricle: Left ventricular ejection fraction, by estimation, is <20%. The left ventricle has severely decreased function. The left ventricle demonstrates global hypokinesis. The left ventricular internal  cavity size was normal in size. There is mild asymmetric left ventricular hypertrophy of the basal-septal segment. Indeterminate diastolic filling due to E-A fusion. Right Ventricle: The right ventricular size is normal. No increase in right ventricular wall thickness. Right ventricular systolic function is severely reduced. Left Atrium: Left atrial size was normal in size. Right Atrium: Right atrial size was normal in size. Pericardium: There is no evidence of pericardial effusion. Mitral Valve: The mitral valve is normal in structure. Moderate mitral annular calcification. Trivial mitral valve  Right calf veins were technically difficult to visualized to exclude DVT.   *See table(s) above for measurements and observations. Electronically signed by Gerarda Fraction on 08/03/2023 at 4:17:32 PM.    Final    VAS [place] LOWER EXTREMITY ARTERIAL DUPLEX (7a-7p)  Result Date: 08/20/2023 LOWER EXTREMITY ARTERIAL DUPLEX STUDY Patient Name:  Antonio [name].  Date of Exam:   08/20/2023 Medical Rec #: [id number]               Accession #:    [id number] Date of Birth: 1956/04/08                Patient Gender: M Patient Age:   67 years Exam Location:  [name] Procedure:      VAS [place] LOWER EXTREMITY ARTERIAL DUPLEX Referring Phys: Lorin Picket GOLDSTON --------------------------------------------------------------------------------  Indications: Peripheral artery disease, and Patient presents with swelling and              severe pain in his right calf. History of known PVD. High Risk Factors: Hypertension, hyperlipidemia, Diabetes.  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                         RIGHT ILEOFEMORAL ENDARTERECTOMY 01/01/22. RIGHT GROIN                         DEBRIDEMENT 02/08/22. Current ABI:            NA Comparison       12/29/21 - Lower Extremity Angiography was performed. No prior Study:           LEAD. Performing Technologist: Marilynne Halsted RDMS, RVT  Examination Guidelines: A complete evaluation includes B-mode imaging, spectral Doppler, color Doppler, and power Doppler as needed of all accessible portions of each vessel. Bilateral testing is considered an integral part of a complete examination. Limited examinations for reoccurring indications may be performed as noted.  +----------+--------+-----+---------------+----------+----------------+ RIGHT     PSV cm/sRatioStenosis       Waveform  Comments         +----------+--------+-----+---------------+----------+----------------+ CFA Mid   75                          monophasic                 +----------+--------+-----+---------------+----------+----------------+ DFA       362          75-99% stenosisbiphasic                   +----------+--------+-----+---------------+----------+----------------+ SFA Prox  183          50-74% stenosismonophasic                 +----------+--------+-----+---------------+----------+----------------+ SFA Mid   120                         monophasic                 +----------+--------+-----+---------------+----------+----------------+  SFA Distal109                         monophasic                 +----------+--------+-----+---------------+----------+----------------+ POP Prox  41  Right calf veins were technically difficult to visualized to exclude DVT.   *See table(s) above for measurements and observations. Electronically signed by Gerarda Fraction on 08/08/2023 at 4:17:32 PM.    Final    VAS [place] LOWER EXTREMITY ARTERIAL DUPLEX (7a-7p)  Result Date: 08/13/2023 LOWER EXTREMITY ARTERIAL DUPLEX STUDY Patient Name:  Antonio [name].  Date of Exam:   08/04/2023 Medical Rec #: [id number]               Accession #:    [id number] Date of Birth: 1956/04/08                Patient Gender: M Patient Age:   67 years Exam Location:  [name] Procedure:      VAS [place] LOWER EXTREMITY ARTERIAL DUPLEX Referring Phys: Lorin Picket GOLD[place]ON --------------------------------------------------------------------------------  Indications: Peripheral artery disease, and Patient presents with swelling and              severe pain in his right calf. History of known PVD. High Risk Factors: Hypertension, hyperlipidemia, Diabetes.  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                         RIGHT ILEOFEMORAL ENDARTERECTOMY 01/01/22. RIGHT GROIN                         DEBRIDEMENT 02/08/22. Current ABI:            NA Comparison       12/29/21 - Lower Extremity Angiography was performed. No prior Study:           LEAD. Performing Technologist: Marilynne Halsted RDMS, RVT  Examination Guidelines: A complete evaluation includes B-mode imaging, spectral Doppler, color Doppler, and power Doppler as needed of all accessible portions of each vessel. Bilateral testing is considered an integral part of a complete examination. Limited examinations for reoccurring indications may be performed as noted.  +----------+--------+-----+---------------+----------+----------------+ RIGHT     PSV cm/sRatioStenosis       Waveform  Comments         +----------+--------+-----+---------------+----------+----------------+ CFA Mid   75                          monophasic                 +----------+--------+-----+---------------+----------+----------------+ DFA       362          75-99% stenosisbiphasic                   +----------+--------+-----+---------------+----------+----------------+ SFA Prox  183          50-74% stenosismonophasic                 +----------+--------+-----+---------------+----------+----------------+ SFA Mid   120                         monophasic                 +----------+--------+-----+---------------+----------+----------------+  SFA Distal109                         monophasic                 +----------+--------+-----+---------------+----------+----------------+ POP Prox  41  Right calf veins were technically difficult to visualized to exclude DVT.   *See table(s) above for measurements and observations. Electronically signed by Gerarda Fraction on 07/30/2023 at 4:17:32 PM.    Final    VAS [place] LOWER EXTREMITY ARTERIAL DUPLEX (7a-7p)  Result Date: 08/05/2023 LOWER EXTREMITY ARTERIAL DUPLEX STUDY Patient Name:  Antonio [name].  Date of Exam:   08/07/2023 Medical Rec #: [id number]               Accession #:    [id number] Date of Birth: 1956/04/08                Patient Gender: M Patient Age:   67 years Exam Location:  [name] Procedure:      VAS [place] LOWER EXTREMITY ARTERIAL DUPLEX Referring Phys: Lorin Picket [place] --------------------------------------------------------------------------------  Indications: Peripheral artery disease, and Patient presents with swelling and              severe pain in his right calf. History of known PVD. High Risk Factors: Hypertension, hyperlipidemia, Diabetes.  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                         RIGHT ILEOFEMORAL ENDARTERECTOMY 01/01/22. RIGHT GROIN                         DEBRIDEMENT 02/08/22. Current ABI:            NA Comparison       12/29/21 - Lower Extremity Angiography was performed. No prior Study:           LEAD. Performing Technologist: Marilynne Halsted RDMS, RVT  Examination Guidelines: A complete evaluation includes B-mode imaging, spectral Doppler, color Doppler, and power Doppler as needed of all accessible portions of each vessel. Bilateral testing is considered an integral part of a complete examination. Limited examinations for reoccurring indications may be performed as noted.  +----------+--------+-----+---------------+----------+----------------+ RIGHT     PSV cm/sRatioStenosis       Waveform  Comments         +----------+--------+-----+---------------+----------+----------------+ CFA Mid   75                          monophasic                 +----------+--------+-----+---------------+----------+----------------+ DFA       362          75-99% stenosisbiphasic                   +----------+--------+-----+---------------+----------+----------------+ SFA Prox  183          50-74% stenosismonophasic                 +----------+--------+-----+---------------+----------+----------------+ SFA Mid   120                         monophasic                 +----------+--------+-----+---------------+----------+----------------+  SFA Distal109                         monophasic                 +----------+--------+-----+---------------+----------+----------------+ POP Prox  41  Result Date: 08/16/2023    ECHOCARDIOGRAM REPORT   Patient Name:   Antonio [name]. Date of Exam: 08/16/2023 Medical Rec #:  [id number]              Height:       72.0 in Accession #:    [id number]             Weight:       226.9 lb Date of Birth:  11-07-1956              BSA:          2.248 m Patient Age:    67 years               BP:           109/80 mmHg Patient Gender: M                      HR:           90 bpm. Exam Location:  Inpatient Procedure: 2D Echo, Cardiac Doppler and Color Doppler Indications:    Cardiac arrest I46.9   History:        Patient has prior history of Echocardiogram examinations. CAD,                 PAD, Arrythmias:Heart Block; Risk Factors:Hypertension,                 Diabetes, Dyslipidemia and Current Smoker.  Sonographer:    Dondra Prader RVT RCS Referring Phys: [id number] Corky Crafts  Sonographer Comments: Technically challenging study due to limited acoustic windows. Supine position. IMPRESSIONS  1. Left ventricular ejection fraction, by estimation, is <20%. The left ventricle has severely decreased function. The left ventricle demonstrates global hypokinesis. There is mild asymmetric left ventricular hypertrophy of the basal-septal segment. Indeterminate diastolic filling due to E-A fusion.  2. Right ventricular systolic function is severely reduced. The right ventricular size is normal.  3. The mitral valve is normal in structure. Trivial mitral valve regurgitation. No evidence of mitral stenosis. Moderate mitral annular calcification.  4. 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 There is no significant stenosis by Doppler but given severe LV dysfunction suspect a component of at least mild low-flow low gradient AS.Marland Kitchen The aortic valve has an indeterminant number of cusps. There is moderate calcification of the aortic valve. Aortic valve regurgitation is not visualized. No aortic stenosis is present.  5. The inferior vena cava is normal in size with greater than 50% respiratory variability, suggesting right atrial pressure of 3 mmHg. Comparison(s): 02/04/22-Left ventricular ejection fraction, by estimation, is 55 to 60%. The left ventricle has normal function. The left ventricle has no regional wall motion abnormalities.  FINDINGS  Left Ventricle: Left ventricular ejection fraction, by estimation, is <20%. The left ventricle has severely decreased function. The left ventricle demonstrates global hypokinesis. The left ventricular internal  cavity size was normal in size. There is mild asymmetric left ventricular hypertrophy of the basal-septal segment. Indeterminate diastolic filling due to E-A fusion. Right Ventricle: The right ventricular size is normal. No increase in right ventricular wall thickness. Right ventricular systolic function is severely reduced. Left Atrium: Left atrial size was normal in size. Right Atrium: Right atrial size was normal in size. Pericardium: There is no evidence of pericardial effusion. Mitral Valve: The mitral valve is normal in structure. Moderate mitral annular calcification. Trivial mitral valve  Result Date: 08/16/2023    ECHOCARDIOGRAM REPORT   Patient Name:   Antonio [name]. Date of Exam: 08/16/2023 Medical Rec #:  [id number]              Height:       72.0 in Accession #:    [id number]             Weight:       226.9 lb Date of Birth:  11-07-1956              BSA:          2.248 m Patient Age:    67 years               BP:           109/80 mmHg Patient Gender: M                      HR:           90 bpm. Exam Location:  Inpatient Procedure: 2D Echo, Cardiac Doppler and Color Doppler Indications:    Cardiac arrest I46.9   History:        Patient has prior history of Echocardiogram examinations. CAD,                 PAD, Arrythmias:Heart Block; Risk Factors:Hypertension,                 Diabetes, Dyslipidemia and Current Smoker.  Sonographer:    Dondra Prader RVT RCS Referring Phys: [id number] Corky Crafts  Sonographer Comments: Technically challenging study due to limited acoustic windows. Supine position. IMPRESSIONS  1. Left ventricular ejection fraction, by estimation, is <20%. The left ventricle has severely decreased function. The left ventricle demonstrates global hypokinesis. There is mild asymmetric left ventricular hypertrophy of the basal-septal segment. Indeterminate diastolic filling due to E-A fusion.  2. Right ventricular systolic function is severely reduced. The right ventricular size is normal.  3. The mitral valve is normal in structure. Trivial mitral valve regurgitation. No evidence of mitral stenosis. Moderate mitral annular calcification.  4. 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 There is no significant stenosis by Doppler but given severe LV dysfunction suspect a component of at least mild low-flow low gradient AS.Marland Kitchen The aortic valve has an indeterminant number of cusps. There is moderate calcification of the aortic valve. Aortic valve regurgitation is not visualized. No aortic stenosis is present.  5. The inferior vena cava is normal in size with greater than 50% respiratory variability, suggesting right atrial pressure of 3 mmHg. Comparison(s): 02/04/22-Left ventricular ejection fraction, by estimation, is 55 to 60%. The left ventricle has normal function. The left ventricle has no regional wall motion abnormalities.  FINDINGS  Left Ventricle: Left ventricular ejection fraction, by estimation, is <20%. The left ventricle has severely decreased function. The left ventricle demonstrates global hypokinesis. The left ventricular internal  cavity size was normal in size. There is mild asymmetric left ventricular hypertrophy of the basal-septal segment. Indeterminate diastolic filling due to E-A fusion. Right Ventricle: The right ventricular size is normal. No increase in right ventricular wall thickness. Right ventricular systolic function is severely reduced. Left Atrium: Left atrial size was normal in size. Right Atrium: Right atrial size was normal in size. Pericardium: There is no evidence of pericardial effusion. Mitral Valve: The mitral valve is normal in structure. Moderate mitral annular calcification. Trivial mitral valve  Right calf veins were technically difficult to visualized to exclude DVT.   *See table(s) above for measurements and observations. Electronically signed by Gerarda Fraction on 08/08/2023 at 4:17:32 PM.    Final    VAS [place] LOWER EXTREMITY ARTERIAL DUPLEX (7a-7p)  Result Date: 08/13/2023 LOWER EXTREMITY ARTERIAL DUPLEX STUDY Patient Name:  Antonio [name].  Date of Exam:   08/04/2023 Medical Rec #: [id number]               Accession #:    [id number] Date of Birth: 1956/04/08                Patient Gender: M Patient Age:   67 years Exam Location:  [name] Procedure:      VAS [place] LOWER EXTREMITY ARTERIAL DUPLEX Referring Phys: Lorin Picket [place] --------------------------------------------------------------------------------  Indications: Peripheral artery disease, and Patient presents with swelling and              severe pain in his right calf. History of known PVD. High Risk Factors: Hypertension, hyperlipidemia, Diabetes.  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                         RIGHT ILEOFEMORAL ENDARTERECTOMY 01/01/22. RIGHT GROIN                         DEBRIDEMENT 02/08/22. Current ABI:            NA Comparison       12/29/21 - Lower Extremity Angiography was performed. No prior Study:           LEAD. Performing Technologist: Marilynne Halsted RDMS, RVT  Examination Guidelines: A complete evaluation includes B-mode imaging, spectral Doppler, color Doppler, and power Doppler as needed of all accessible portions of each vessel. Bilateral testing is considered an integral part of a complete examination. Limited examinations for reoccurring indications may be performed as noted.  +----------+--------+-----+---------------+----------+----------------+ RIGHT     PSV cm/sRatioStenosis       Waveform  Comments         +----------+--------+-----+---------------+----------+----------------+ CFA Mid   75                          monophasic                 +----------+--------+-----+---------------+----------+----------------+ DFA       362          75-99% stenosisbiphasic                   +----------+--------+-----+---------------+----------+----------------+ SFA Prox  183          50-74% stenosismonophasic                 +----------+--------+-----+---------------+----------+----------------+ SFA Mid   120                         monophasic                 +----------+--------+-----+---------------+----------+----------------+  SFA Distal109                         monophasic                 +----------+--------+-----+---------------+----------+----------------+ POP Prox  41  Result Date: 08/16/2023    ECHOCARDIOGRAM REPORT   Patient Name:   Antonio [name]. Date of Exam: 08/16/2023 Medical Rec #:  [id number]              Height:       72.0 in Accession #:    [id number]             Weight:       226.9 lb Date of Birth:  11-07-1956              BSA:          2.248 m Patient Age:    67 years               BP:           109/80 mmHg Patient Gender: M                      HR:           90 bpm. Exam Location:  Inpatient Procedure: 2D Echo, Cardiac Doppler and Color Doppler Indications:    Cardiac arrest I46.9   History:        Patient has prior history of Echocardiogram examinations. CAD,                 PAD, Arrythmias:Heart Block; Risk Factors:Hypertension,                 Diabetes, Dyslipidemia and Current Smoker.  Sonographer:    Dondra Prader RVT RCS Referring Phys: [id number] Corky Crafts  Sonographer Comments: Technically challenging study due to limited acoustic windows. Supine position. IMPRESSIONS  1. Left ventricular ejection fraction, by estimation, is <20%. The left ventricle has severely decreased function. The left ventricle demonstrates global hypokinesis. There is mild asymmetric left ventricular hypertrophy of the basal-septal segment. Indeterminate diastolic filling due to E-A fusion.  2. Right ventricular systolic function is severely reduced. The right ventricular size is normal.  3. The mitral valve is normal in structure. Trivial mitral valve regurgitation. No evidence of mitral stenosis. Moderate mitral annular calcification.  4. 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 There is no significant stenosis by Doppler but given severe LV dysfunction suspect a component of at least mild low-flow low gradient AS.Marland Kitchen The aortic valve has an indeterminant number of cusps. There is moderate calcification of the aortic valve. Aortic valve regurgitation is not visualized. No aortic stenosis is present.  5. The inferior vena cava is normal in size with greater than 50% respiratory variability, suggesting right atrial pressure of 3 mmHg. Comparison(s): 02/04/22-Left ventricular ejection fraction, by estimation, is 55 to 60%. The left ventricle has normal function. The left ventricle has no regional wall motion abnormalities.  FINDINGS  Left Ventricle: Left ventricular ejection fraction, by estimation, is <20%. The left ventricle has severely decreased function. The left ventricle demonstrates global hypokinesis. The left ventricular internal  cavity size was normal in size. There is mild asymmetric left ventricular hypertrophy of the basal-septal segment. Indeterminate diastolic filling due to E-A fusion. Right Ventricle: The right ventricular size is normal. No increase in right ventricular wall thickness. Right ventricular systolic function is severely reduced. Left Atrium: Left atrial size was normal in size. Right Atrium: Right atrial size was normal in size. Pericardium: There is no evidence of pericardial effusion. Mitral Valve: The mitral valve is normal in structure. Moderate mitral annular calcification. Trivial mitral valve  Right calf veins were technically difficult to visualized to exclude DVT.   *See table(s) above for measurements and observations. Electronically signed by Gerarda Fraction on 08/08/2023 at 4:17:32 PM.    Final    VAS [place] LOWER EXTREMITY ARTERIAL DUPLEX (7a-7p)  Result Date: 08/13/2023 LOWER EXTREMITY ARTERIAL DUPLEX STUDY Patient Name:  Antonio [name].  Date of Exam:   08/04/2023 Medical Rec #: [id number]               Accession #:    [id number] Date of Birth: 1956/04/08                Patient Gender: M Patient Age:   67 years Exam Location:  [name] Procedure:      VAS [place] LOWER EXTREMITY ARTERIAL DUPLEX Referring Phys: Lorin Picket [place] --------------------------------------------------------------------------------  Indications: Peripheral artery disease, and Patient presents with swelling and              severe pain in his right calf. History of known PVD. High Risk Factors: Hypertension, hyperlipidemia, Diabetes.  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                         RIGHT ILEOFEMORAL ENDARTERECTOMY 01/01/22. RIGHT GROIN                         DEBRIDEMENT 02/08/22. Current ABI:            NA Comparison       12/29/21 - Lower Extremity Angiography was performed. No prior Study:           LEAD. Performing Technologist: Marilynne Halsted RDMS, RVT  Examination Guidelines: A complete evaluation includes B-mode imaging, spectral Doppler, color Doppler, and power Doppler as needed of all accessible portions of each vessel. Bilateral testing is considered an integral part of a complete examination. Limited examinations for reoccurring indications may be performed as noted.  +----------+--------+-----+---------------+----------+----------------+ RIGHT     PSV cm/sRatioStenosis       Waveform  Comments         +----------+--------+-----+---------------+----------+----------------+ CFA Mid   75                          monophasic                 +----------+--------+-----+---------------+----------+----------------+ DFA       362          75-99% stenosisbiphasic                   +----------+--------+-----+---------------+----------+----------------+ SFA Prox  183          50-74% stenosismonophasic                 +----------+--------+-----+---------------+----------+----------------+ SFA Mid   120                         monophasic                 +----------+--------+-----+---------------+----------+----------------+  SFA Distal109                         monophasic                 +----------+--------+-----+---------------+----------+----------------+ POP Prox  41  Right calf veins were technically difficult to visualized to exclude DVT.   *See table(s) above for measurements and observations. Electronically signed by Gerarda Fraction on 08/08/2023 at 4:17:32 PM.    Final    VAS [place] LOWER EXTREMITY ARTERIAL DUPLEX (7a-7p)  Result Date: 08/13/2023 LOWER EXTREMITY ARTERIAL DUPLEX STUDY Patient Name:  Antonio [name].  Date of Exam:   08/04/2023 Medical Rec #: [id number]               Accession #:    [id number] Date of Birth: 1956/04/08                Patient Gender: M Patient Age:   67 years Exam Location:  [name] Procedure:      VAS [place] LOWER EXTREMITY ARTERIAL DUPLEX Referring Phys: Lorin Picket [place] --------------------------------------------------------------------------------  Indications: Peripheral artery disease, and Patient presents with swelling and              severe pain in his right calf. History of known PVD. High Risk Factors: Hypertension, hyperlipidemia, Diabetes.  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                         RIGHT ILEOFEMORAL ENDARTERECTOMY 01/01/22. RIGHT GROIN                         DEBRIDEMENT 02/08/22. Current ABI:            NA Comparison       12/29/21 - Lower Extremity Angiography was performed. No prior Study:           LEAD. Performing Technologist: Marilynne Halsted RDMS, RVT  Examination Guidelines: A complete evaluation includes B-mode imaging, spectral Doppler, color Doppler, and power Doppler as needed of all accessible portions of each vessel. Bilateral testing is considered an integral part of a complete examination. Limited examinations for reoccurring indications may be performed as noted.  +----------+--------+-----+---------------+----------+----------------+ RIGHT     PSV cm/sRatioStenosis       Waveform  Comments         +----------+--------+-----+---------------+----------+----------------+ CFA Mid   75                          monophasic                 +----------+--------+-----+---------------+----------+----------------+ DFA       362          75-99% stenosisbiphasic                   +----------+--------+-----+---------------+----------+----------------+ SFA Prox  183          50-74% stenosismonophasic                 +----------+--------+-----+---------------+----------+----------------+ SFA Mid   120                         monophasic                 +----------+--------+-----+---------------+----------+----------------+  SFA Distal109                         monophasic                 +----------+--------+-----+---------------+----------+----------------+ POP Prox  41  Right calf veins were technically difficult to visualized to exclude DVT.   *See table(s) above for measurements and observations. Electronically signed by Gerarda Fraction on 08/03/2023 at 4:17:32 PM.    Final    VAS [place] LOWER EXTREMITY ARTERIAL DUPLEX (7a-7p)  Result Date: 08/20/2023 LOWER EXTREMITY ARTERIAL DUPLEX STUDY Patient Name:  Antonio [name].  Date of Exam:   08/20/2023 Medical Rec #: [id number]               Accession #:    [id number] Date of Birth: 1956/04/08                Patient Gender: M Patient Age:   67 years Exam Location:  [name] Procedure:      VAS [place] LOWER EXTREMITY ARTERIAL DUPLEX Referring Phys: Lorin Picket G[place] --------------------------------------------------------------------------------  Indications: Peripheral artery disease, and Patient presents with swelling and              severe pain in his right calf. History of known PVD. High Risk Factors: Hypertension, hyperlipidemia, Diabetes.  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                         RIGHT ILEOFEMORAL ENDARTERECTOMY 01/01/22. RIGHT GROIN                         DEBRIDEMENT 02/08/22. Current ABI:            NA Comparison       12/29/21 - Lower Extremity Angiography was performed. No prior Study:           LEAD. Performing Technologist: Marilynne Halsted RDMS, RVT  Examination Guidelines: A complete evaluation includes B-mode imaging, spectral Doppler, color Doppler, and power Doppler as needed of all accessible portions of each vessel. Bilateral testing is considered an integral part of a complete examination. Limited examinations for reoccurring indications may be performed as noted.  +----------+--------+-----+---------------+----------+----------------+ RIGHT     PSV cm/sRatioStenosis       Waveform  Comments         +----------+--------+-----+---------------+----------+----------------+ CFA Mid   75                          monophasic                 +----------+--------+-----+---------------+----------+----------------+ DFA       362          75-99% stenosisbiphasic                   +----------+--------+-----+---------------+----------+----------------+ SFA Prox  183          50-74% stenosismonophasic                 +----------+--------+-----+---------------+----------+----------------+ SFA Mid   120                         monophasic                 +----------+--------+-----+---------------+----------+----------------+  SFA Distal109                         monophasic                 +----------+--------+-----+---------------+----------+----------------+ POP Prox  41

## 2023-08-17 NOTE — Progress Notes (Signed)
ANTICOAGULATION CONSULT NOTE  Pharmacy Consult for heparin Indication: chest pain/ACS  Allergies  Allergen Reactions   Iodinated Contrast Media Hives, Rash and Other (See Comments)    Blisters Staph Blisters / staph   Penicillins Other (See Comments)    UNSPECIFIED REACTION  Has patient had a PCN reaction causing immediate rash, facial/tongue/throat swelling, SOB or lightheadedness with hypotension: No Has patient had a PCN reaction causing severe rash involving mucus membranes or skin necrosis: No Has patient had a PCN reaction that required hospitalization: No Has patient had a PCN reaction occurring within the last 10 years: No If all of the above answers are "NO", then may proceed with Cephalosporin use.    Patient Measurements: Height: 6' (182.9 cm) Weight: 102.9 kg (226 lb 13.7 oz) IBW/kg (Calculated) : 77.6 Heparin Dosing Weight: 100 kg  Vital Signs: Temp: 99.8 F (37.7 C) (09/21 0124) Temp Source: Axillary (09/21 0124) BP: 136/76 (09/20 2300) Pulse Rate: 93 (09/21 0040)  Labs: Recent Labs    08/24/2023 2110 08/16/23 0319 08/16/23 0350 08/16/23 0428 08/16/23 1333 08/16/23 1901 08/16/23 2328 08/17/23 0157  HGB 13.8  --  14.3 13.8 12.8*  --   --   --   HCT 46.8  --  42.0 45.9 42.7  --   --   --   PLT 391  --   --  328 186  --   --   --   HEPARINUNFRC  --   --   --   --   --  0.15*  --  0.18*  CREATININE 2.83* 3.07*  --   --   --   --  4.49* 4.79*  TROPONINIHS 6,747* >24,000*  --   --   --   --   --   --     Estimated Creatinine Clearance: 19.1 mL/min (A) (by C-G formula based on SCr of 4.79 mg/dL (H)).   Medical History: Past Medical History:  Diagnosis Date   Coronary artery disease    Diabetes mellitus without complication (HCC)    typ2   GERD (gastroesophageal reflux disease)    Hyperlipidemia    Hypertension    Osteomyelitis (HCC) 03/25/2017   RT FOOT     Assessment: 71 yoM admitted with urinary symptoms. Pt subsequently had PEA arrest and  noted to have AV block with elevated troponins. Pharmacy to start IV heparin for ACS r/o. CBC ok, no AC PTA. Will defer initial bolus given recent compressions w/ rib fractures.  HL 0.18 - sub-therapeutic   Goal of Therapy:  Heparin level 0.3-0.7 units/ml Monitor platelets by anticoagulation protocol: Yes   Plan:  Heparin 1500 units IV x1 then  Increase heparin to 1850 units/h 8h heparin level Monitor daily heparin level, CBC F/u after Monday LHC  Calton Dach, PharmD, BCCCP Clinical Pharmacist 08/17/2023 3:01 AM

## 2023-08-17 NOTE — Progress Notes (Signed)
1015- Right femoral TPM d/c'd by Dr Rennis Golden.  Sheath remains in place with NS infusing.

## 2023-08-18 ENCOUNTER — Inpatient Hospital Stay (HOSPITAL_COMMUNITY): Payer: Medicare Other

## 2023-08-18 DIAGNOSIS — R569 Unspecified convulsions: Secondary | ICD-10-CM | POA: Diagnosis not present

## 2023-08-18 DIAGNOSIS — I5082 Biventricular heart failure: Secondary | ICD-10-CM | POA: Diagnosis not present

## 2023-08-18 DIAGNOSIS — I442 Atrioventricular block, complete: Secondary | ICD-10-CM | POA: Diagnosis not present

## 2023-08-18 DIAGNOSIS — I469 Cardiac arrest, cause unspecified: Secondary | ICD-10-CM | POA: Diagnosis not present

## 2023-08-18 LAB — RENAL FUNCTION PANEL
Albumin: 2 g/dL — ABNORMAL LOW (ref 3.5–5.0)
Anion gap: 12 (ref 5–15)
BUN: 76 mg/dL — ABNORMAL HIGH (ref 8–23)
CO2: 23 mmol/L (ref 22–32)
Calcium: 7.1 mg/dL — ABNORMAL LOW (ref 8.9–10.3)
Chloride: 105 mmol/L (ref 98–111)
Creatinine, Ser: 4.89 mg/dL — ABNORMAL HIGH (ref 0.61–1.24)
GFR, Estimated: 12 mL/min — ABNORMAL LOW (ref >60)
Glucose, Bld: 121 mg/dL — ABNORMAL HIGH (ref 70–99)
Phosphorus: 7.9 mg/dL — ABNORMAL HIGH (ref 2.5–4.6)
Potassium: 6.1 mmol/L — ABNORMAL HIGH (ref 3.5–5.1)
Sodium: 140 mmol/L (ref 135–145)

## 2023-08-18 LAB — URINE CULTURE: Culture: NO GROWTH

## 2023-08-18 LAB — CBC
HCT: 40.3 % (ref 39.0–52.0)
Hemoglobin: 12.1 g/dL — ABNORMAL LOW (ref 13.0–17.0)
MCH: 25.9 pg — ABNORMAL LOW (ref 26.0–34.0)
MCHC: 30 g/dL (ref 30.0–36.0)
MCV: 86.1 fL (ref 80.0–100.0)
Platelets: 133 10*3/uL — ABNORMAL LOW (ref 150–400)
RBC: 4.68 MIL/uL (ref 4.22–5.81)
RDW: 15.5 % (ref 11.5–15.5)
WBC: 17.3 10*3/uL — ABNORMAL HIGH (ref 4.0–10.5)
nRBC: 0.1 % (ref 0.0–0.2)

## 2023-08-18 LAB — BASIC METABOLIC PANEL WITH GFR
Anion gap: 16 — ABNORMAL HIGH (ref 5–15)
BUN: 92 mg/dL — ABNORMAL HIGH (ref 8–23)
CO2: 20 mmol/L — ABNORMAL LOW (ref 22–32)
Calcium: 7.6 mg/dL — ABNORMAL LOW (ref 8.9–10.3)
Chloride: 104 mmol/L (ref 98–111)
Creatinine, Ser: 5.87 mg/dL — ABNORMAL HIGH (ref 0.61–1.24)
GFR, Estimated: 10 mL/min — ABNORMAL LOW
Glucose, Bld: 128 mg/dL — ABNORMAL HIGH (ref 70–99)
Potassium: 6.5 mmol/L (ref 3.5–5.1)
Sodium: 140 mmol/L (ref 135–145)

## 2023-08-18 LAB — BASIC METABOLIC PANEL
Anion gap: 16 — ABNORMAL HIGH (ref 5–15)
BUN: 86 mg/dL — ABNORMAL HIGH (ref 8–23)
CO2: 20 mmol/L — ABNORMAL LOW (ref 22–32)
Calcium: 7.6 mg/dL — ABNORMAL LOW (ref 8.9–10.3)
Chloride: 103 mmol/L (ref 98–111)
Creatinine, Ser: 5.62 mg/dL — ABNORMAL HIGH (ref 0.61–1.24)
GFR, Estimated: 11 mL/min — ABNORMAL LOW (ref >60)
Glucose, Bld: 122 mg/dL — ABNORMAL HIGH (ref 70–99)
Potassium: 6.6 mmol/L (ref 3.5–5.1)
Sodium: 139 mmol/L (ref 135–145)

## 2023-08-18 LAB — GLUCOSE, CAPILLARY
Glucose-Capillary: 112 mg/dL — ABNORMAL HIGH (ref 70–99)
Glucose-Capillary: 123 mg/dL — ABNORMAL HIGH (ref 70–99)
Glucose-Capillary: 131 mg/dL — ABNORMAL HIGH (ref 70–99)
Glucose-Capillary: 139 mg/dL — ABNORMAL HIGH (ref 70–99)
Glucose-Capillary: 92 mg/dL (ref 70–99)
Glucose-Capillary: 95 mg/dL (ref 70–99)

## 2023-08-18 LAB — MAGNESIUM: Magnesium: 3.1 mg/dL — ABNORMAL HIGH (ref 1.7–2.4)

## 2023-08-18 LAB — HEPARIN LEVEL (UNFRACTIONATED): Heparin Unfractionated: 0.44 IU/mL (ref 0.30–0.70)

## 2023-08-18 LAB — AMMONIA: Ammonia: 28 umol/L (ref 9–35)

## 2023-08-18 LAB — NA AND K (SODIUM & POTASSIUM), RAND UR
Potassium Urine: 35 mmol/L
Sodium, Ur: 96 mmol/L

## 2023-08-18 LAB — PHOSPHORUS: Phosphorus: 8.3 mg/dL — ABNORMAL HIGH (ref 2.5–4.6)

## 2023-08-18 MED ORDER — DOXYCYCLINE HYCLATE 100 MG PO TABS
100.0000 mg | ORAL_TABLET | Freq: Two times a day (BID) | ORAL | Status: AC
  Administered 2023-08-19 – 2023-08-20 (×4): 100 mg via ORAL
  Filled 2023-08-18 (×4): qty 1

## 2023-08-18 MED ORDER — VITAMIN C 500 MG PO TABS
250.0000 mg | ORAL_TABLET | Freq: Two times a day (BID) | ORAL | Status: DC
  Administered 2023-08-19 – 2023-08-24 (×11): 250 mg via ORAL
  Filled 2023-08-18 (×11): qty 1

## 2023-08-18 MED ORDER — ALBUTEROL SULFATE (2.5 MG/3ML) 0.083% IN NEBU: 10.0000 mg | INHALATION_SOLUTION | Freq: Once | RESPIRATORY_TRACT | Status: DC

## 2023-08-18 MED ORDER — CHLORHEXIDINE GLUCONATE CLOTH 2 % EX PADS
6.0000 | MEDICATED_PAD | Freq: Every day | CUTANEOUS | Status: AC
  Administered 2023-08-19 – 2023-08-23 (×6): 6 via TOPICAL

## 2023-08-18 MED ORDER — PRISMASOL BGK 0/2.5 32-2.5 MEQ/L EC SOLN
Status: DC
  Filled 2023-08-18 (×56): qty 5000

## 2023-08-18 MED ORDER — PRISMASOL BGK 0/2.5 32-2.5 MEQ/L REPLACEMENT SOLN
Status: DC
  Filled 2023-08-18 (×7): qty 5000

## 2023-08-18 MED ORDER — ATORVASTATIN CALCIUM 40 MG PO TABS
40.0000 mg | ORAL_TABLET | Freq: Every day | ORAL | Status: DC
  Administered 2023-08-19 – 2023-08-24 (×6): 40 mg via ORAL
  Filled 2023-08-18 (×3): qty 1
  Filled 2023-08-18: qty 4
  Filled 2023-08-18 (×2): qty 1

## 2023-08-18 MED ORDER — SODIUM POLYSTYRENE SULFONATE 15 GM/60ML PO SUSP
30.0000 g | Freq: Once | ORAL | Status: AC
  Administered 2023-08-18: 30 g via RECTAL
  Filled 2023-08-18: qty 120

## 2023-08-18 MED ORDER — FAMOTIDINE 20 MG PO TABS
20.0000 mg | ORAL_TABLET | Freq: Every day | ORAL | Status: DC
  Administered 2023-08-19 – 2023-08-24 (×6): 20 mg via ORAL
  Filled 2023-08-18 (×6): qty 1

## 2023-08-18 MED ORDER — HEPARIN SODIUM (PORCINE) 1000 UNIT/ML DIALYSIS
1000.0000 [IU] | INTRAMUSCULAR | Status: DC | PRN
  Administered 2023-08-18: 2400 [IU] via INTRAVENOUS_CENTRAL
  Filled 2023-08-18: qty 6
  Filled 2023-08-18: qty 3
  Filled 2023-08-18 (×2): qty 6

## 2023-08-18 MED ORDER — PRISMASOL BGK 4/2.5 32-4-2.5 MEQ/L EC SOLN: Status: DC

## 2023-08-18 MED ORDER — SODIUM CHLORIDE 0.9 % IV SOLN
1.0000 g | Freq: Once | INTRAVENOUS | Status: AC
  Administered 2023-08-18: 1 g via INTRAVENOUS
  Filled 2023-08-18: qty 10

## 2023-08-18 MED ORDER — ASPIRIN 81 MG PO CHEW
81.0000 mg | CHEWABLE_TABLET | Freq: Every day | ORAL | Status: DC
  Administered 2023-08-20 – 2023-08-24 (×5): 81 mg via ORAL
  Filled 2023-08-18 (×6): qty 1

## 2023-08-18 MED ORDER — CALCIUM GLUCONATE-NACL 1-0.675 GM/50ML-% IV SOLN
1.0000 g | Freq: Once | INTRAVENOUS | Status: AC
  Administered 2023-08-18: 1000 mg via INTRAVENOUS
  Filled 2023-08-18: qty 50

## 2023-08-18 MED ORDER — PRISMASOL BGK 4/2.5 32-4-2.5 MEQ/L REPLACEMENT SOLN: Status: DC

## 2023-08-18 MED ORDER — MUPIROCIN 2 % EX OINT
1.0000 | TOPICAL_OINTMENT | Freq: Two times a day (BID) | CUTANEOUS | Status: AC
  Administered 2023-08-18 – 2023-08-22 (×9): 1 via NASAL
  Filled 2023-08-18 (×4): qty 22

## 2023-08-18 MED ORDER — RENA-VITE PO TABS: 1.0000 | ORAL_TABLET | Freq: Every day | ORAL | Status: DC

## 2023-08-18 NOTE — Progress Notes (Signed)
DAILY PROGRESS NOTE   Patient Name: Antonio [name]. Date of Encounter: 08/18/2023 Cardiologist: Rollene Rotunda, MD  Chief Complaint   Extubated  Patient Profile   67 yo male with CAD, DM2, HTN, HLD, osteomyelitis and prior amputations, presented with dysuria and developed cardiac arrest.   Subjective   Extubated - now on BIPAP. Plans for CRRT today likely per nephrology - creatinine worse at 5.86, urine output low - volume overloaded. Will need cardiac cath - but dye load likely to lead to dialysis. Noted to be hyperkalemic as well - 6.6.   Objective   Vitals:   08/18/23 0700 08/18/23 0800 08/18/23 0815 08/18/23 0830  BP: 91/62  (!) 89/58 (!) 92/58  Pulse: 66  66 65  Resp: (!) 27  (!) 26 (!) 27  Temp:  97.6 F (36.4 C)    TempSrc:  Axillary    SpO2: 100%  100% 100%  Weight:      Height:        Intake/Output Summary (Last 24 hours) at 08/18/2023 0857 Last data filed at 08/18/2023 0534 Gross per 24 hour  Intake 645.79 ml  Output 217 ml  Net 428.79 ml   Filed Weights   08/16/23 0500 08/17/23 0500 08/18/23 0500  Weight: 102.9 kg 106.6 kg 109.3 kg    Physical Exam   General appearance: lightly sedated on BIPAP Neck: JVD - several cm above sternal notch, no carotid bruit, and thyroid not enlarged, symmetric, no tenderness/mass/nodules Lungs: diminished breath sounds bilaterally Heart: regular rate and rhythm Abdomen: soft, non-tender; bowel sounds normal; no masses,  no organomegaly Extremities: edema 2+ Pulses: 2+ and symmetric Skin: Skin color, texture, turgor normal. No rashes or lesions Neurologic: Grossly normal Psych: Cannot assess  Inpatient Medications    Scheduled Meds:  arformoterol  15 mcg Nebulization BID   ascorbic acid  250 mg Per Tube BID   aspirin  81 mg Per Tube Daily   atorvastatin  40 mg Per Tube Daily   budesonide (PULMICORT) nebulizer solution  0.25 mg Nebulization BID   Chlorhexidine Gluconate Cloth  6 each Topical Daily    doxycycline  100 mg Per Tube Q12H   famotidine  20 mg Per Tube Daily   feeding supplement (PROSource TF20)  60 mL Per Tube BID   furosemide  60 mg Intravenous Q8H   insulin aspart  0-9 Units Subcutaneous Q4H   insulin glargine-yfgn  10 Units Subcutaneous Daily   methylPREDNISolone (SOLU-MEDROL) injection  40 mg Intravenous Q12H   multivitamin  1 tablet Per Tube QHS   mouth rinse  15 mL Mouth Rinse Q2H   revefenacin  175 mcg Nebulization Daily   sodium chloride flush  3 mL Intravenous Q12H    Continuous Infusions:  sodium chloride     sodium chloride 10 mL/hr at 08/17/23 2110   clevidipine Stopped (08/17/23 1902)   dexmedetomidine (PRECEDEX) IV infusion 1.1 mcg/kg/hr (08/18/23 0649)   epinephrine     feeding supplement (VITAL 1.5 CAL) Stopped (08/17/23 1059)   fentaNYL infusion INTRAVENOUS Stopped (08/17/23 0743)   heparin 1,850 Units/hr (08/17/23 2110)   norepinephrine (LEVOPHED) Adult infusion Stopped (08/16/23 1039)   propofol (DIPRIVAN) infusion Stopped (08/17/23 1059)    PRN Meds: sodium chloride, acetaminophen, docusate, hydrALAZINE, ondansetron (ZOFRAN) IV, mouth rinse, polyethylene glycol, sodium chloride flush   Labs   Results for orders placed or performed during the hospital encounter of 07/29/2023 (from the past 48 hour(s))  Potassium     Status: Abnormal  Collection Time: 08/16/23  9:40 AM  Result Value Ref Range   Potassium 5.7 (H) 3.5 - 5.1 mmol/L    Comment: Performed at [facility], [address] [place], [place] [address]  Hemoglobin A1c     Status: Abnormal   Collection Time: 08/16/23  9:40 AM  Result Value Ref Range   Hgb A1c MFr Bld 6.6 (H) 4.8 - 5.6 %    Comment: (NOTE) Pre diabetes:          5.7%-6.4%  Diabetes:              >6.4%  Glycemic control for   <7.0% adults with diabetes    Mean Plasma Glucose 142.72 mg/dL    Comment: Performed at [facility], [address] [place], [place] [address]  Brain natriuretic peptide      Status: Abnormal   Collection Time: 08/16/23  9:40 AM  Result Value Ref Range   B Natriuretic Peptide 1,272.0 (H) 0.0 - 100.0 pg/mL    Comment: Performed at [facility], [address] [place], [place] [address]  Urinalysis, w/ Reflex to Culture (Infection Suspected) -Urine, Clean Catch     Status: Abnormal   Collection Time: 08/16/23 10:12 AM  Result Value Ref Range   Specimen Source URINE, CLEAN CATCH    Color, Urine YELLOW YELLOW   APPearance CLOUDY (A) CLEAR   Specific Gravity, Urine 1.017 1.005 - 1.030   pH 6.0 5.0 - 8.0   Glucose, UA 150 (A) NEGATIVE mg/dL   Hgb urine dipstick SMALL (A) NEGATIVE   Bilirubin Urine NEGATIVE NEGATIVE   Ketones, ur NEGATIVE NEGATIVE mg/dL   Protein, ur >=469 (A) NEGATIVE mg/dL   Nitrite NEGATIVE NEGATIVE   Leukocytes,Ua LARGE (A) NEGATIVE   RBC / HPF 21-50 0 - 5 RBC/hpf   WBC, UA >50 0 - 5 WBC/hpf    Comment:        Reflex urine culture not performed if WBC <=10, OR if Squamous epithelial cells >5. If Squamous epithelial cells >5 suggest recollection.    Bacteria, UA NONE SEEN NONE SEEN   Squamous Epithelial / HPF 0-5 0 - 5 /HPF   WBC Clumps PRESENT    Mucus PRESENT     Comment: Performed at [facility], [address], [place], [place] [address]  Glucose, capillary     Status: Abnormal   Collection Time: 08/16/23 11:41 AM  Result Value Ref Range   Glucose-Capillary 146 (H) 70 - 99 mg/dL    Comment: Glucose reference range applies only to samples taken after fasting for at least 8 hours.  Potassium     Status: Abnormal   Collection Time: 08/16/23  1:33 PM  Result Value Ref Range   Potassium 5.7 (H) 3.5 - 5.1 mmol/L    Comment: Performed at [facility], [address] [place], [place] [address]  CBC     Status: Abnormal   Collection Time: 08/16/23  1:33 PM  Result Value Ref Range   WBC 17.8 (H) 4.0 - 10.5 K/uL   RBC 5.14 4.22 - 5.81 MIL/uL   Hemoglobin 12.8 (L) 13.0 - 17.0 g/dL   HCT 44.0 10.2 - 72.5 %   MCV  83.1 80.0 - 100.0 fL   MCH 24.9 (L) 26.0 - 34.0 pg   MCHC 30.0 30.0 - 36.0 g/dL   RDW 36.6 44.0 - 34.7 %   Platelets 186 150 - 400 K/uL   nRBC 0.0  0.0 - 0.2 %    Comment: Performed at [facility], [address] [place], [place] [address]  Procalcitonin     Status: None   Collection Time: 08/16/23  1:33 PM  Result Value Ref Range   Procalcitonin 15.37 ng/mL    Comment:        Interpretation: PCT >= 10 ng/mL: Important systemic inflammatory response, almost exclusively due to severe bacterial sepsis or septic shock. (NOTE)       Sepsis PCT Algorithm           Lower Respiratory Tract                                      Infection PCT Algorithm    ----------------------------     ----------------------------         PCT < 0.25 ng/mL                PCT < 0.10 ng/mL          Strongly encourage             Strongly discourage   discontinuation of antibiotics    initiation of antibiotics    ----------------------------     -----------------------------       PCT 0.25 - 0.50 ng/mL            PCT 0.10 - 0.25 ng/mL               OR       >80% decrease in PCT            Discourage initiation of                                            antibiotics      Encourage discontinuation           of antibiotics    ----------------------------     -----------------------------         PCT >= 0.50 ng/mL              PCT 0.26 - 0.50 ng/mL                AND       <80% decrease in PCT             Encourage initiation of                                             antibiotics       Encourage continuation           of antibiotics    ----------------------------     -----------------------------        PCT >= 0.50 ng/mL                  PCT > 0.50 ng/mL               AND         increase in PCT                  Strongly encourage  initiation of antibiotics    Strongly encourage escalation           of antibiotics                                      -----------------------------                                           PCT <= 0.25 ng/mL                                                 OR                                        > 80% decrease in PCT                                      Discontinue / Do not initiate                                             antibiotics  Performed at [facility], [address] [place], [place] [address]   Potassium     Status: Abnormal   Collection Time: 08/16/23  6:03 PM  Result Value Ref Range   Potassium 5.9 (H) 3.5 - 5.1 mmol/L    Comment: Performed at [facility], [address], [place], [place] [address]  Potassium     Status: Abnormal   Collection Time: 08/16/23  7:00 PM  Result Value Ref Range   Potassium 5.9 (H) 3.5 - 5.1 mmol/L    Comment: Performed at [facility], [address] [place], [place] [address]  Heparin level (unfractionated)     Status: Abnormal   Collection Time: 08/16/23  7:01 PM  Result Value Ref Range   Heparin Unfractionated 0.15 (L) 0.30 - 0.70 IU/mL    Comment: (NOTE) The clinical reportable range upper limit is being lowered to >1.10 to align with the FDA approved guidance for the current laboratory assay.  If heparin results are below expected values, and patient dosage has  been confirmed, suggest follow up testing of antithrombin III levels. Performed at [facility], [address], [place], [place] [address]   Potassium     Status: Abnormal   Collection Time: 08/16/23  9:21 PM  Result Value Ref Range   Potassium 5.8 (H) 3.5 - 5.1 mmol/L    Comment: Performed at [facility], [address], [place], [place] [address]  Basic metabolic panel     Status: Abnormal   Collection Time: 08/16/23 11:28 PM  Result Value Ref Range   Sodium 140 135 - 145 mmol/L   Potassium 5.6 (H) 3.5 - 5.1 mmol/L   Chloride 108 98 - 111 mmol/L   CO2 20 (L) 22 - 32 mmol/L   Glucose, Bld 272 (  H) 70 - 99 mg/dL    Comment: Glucose reference  range applies only to samples taken after fasting for at least 8 hours.   BUN 54 (H) 8 - 23 mg/dL   Creatinine, Ser 0.98 (H) 0.61 - 1.24 mg/dL   Calcium 7.7 (L) 8.9 - 10.3 mg/dL   GFR, Estimated 14 (L) >60 mL/min    Comment: (NOTE) Calculated using the CKD-EPI Creatinine Equation (2021)    Anion gap 12 5 - 15    Comment: Performed at [facility], [address], [place], [place] [address]  Heparin level (unfractionated)     Status: Abnormal   Collection Time: 08/17/23  1:57 AM  Result Value Ref Range   Heparin Unfractionated 0.18 (L) 0.30 - 0.70 IU/mL    Comment: (NOTE) The clinical reportable range upper limit is being lowered to >1.10 to align with the FDA approved guidance for the current laboratory assay.  If heparin results are below expected values, and patient dosage has  been confirmed, suggest follow up testing of antithrombin III levels. Performed at [facility], [address], [place], [place] [address]   Basic metabolic panel     Status: Abnormal   Collection Time: 08/17/23  1:57 AM  Result Value Ref Range   Sodium 142 135 - 145 mmol/L   Potassium 4.9 3.5 - 5.1 mmol/L   Chloride 106 98 - 111 mmol/L   CO2 22 22 - 32 mmol/L   Glucose, Bld 352 (H) 70 - 99 mg/dL    Comment: Glucose reference range applies only to samples taken after fasting for at least 8 hours.   BUN 57 (H) 8 - 23 mg/dL   Creatinine, Ser 6.21 (H) 0.61 - 1.24 mg/dL   Calcium 7.9 (L) 8.9 - 10.3 mg/dL   GFR, Estimated 13 (L) >60 mL/min    Comment: (NOTE) Calculated using the CKD-EPI Creatinine Equation (2021)    Anion gap 14 5 - 15    Comment: Performed at [facility], [address], [place], [place] [address]  Glucose, capillary     Status: Abnormal   Collection Time: 08/17/23  3:22 AM  Result Value Ref Range   Glucose-Capillary 245 (H) 70 - 99 mg/dL    Comment: Glucose reference range applies only to samples taken after fasting for at least 8 hours.  Glucose, capillary      Status: Abnormal   Collection Time: 08/17/23  8:21 AM  Result Value Ref Range   Glucose-Capillary 291 (H) 70 - 99 mg/dL    Comment: Glucose reference range applies only to samples taken after fasting for at least 8 hours.  Triglycerides     Status: None   Collection Time: 08/17/23  9:46 AM  Result Value Ref Range   Triglycerides 117 <150 mg/dL    Comment: Performed at [facility], [address] [place], [place] [address]  CBC     Status: Abnormal   Collection Time: 08/17/23  9:46 AM  Result Value Ref Range   WBC 15.9 (H) 4.0 - 10.5 K/uL   RBC 5.04 4.22 - 5.81 MIL/uL   Hemoglobin 12.6 (L) 13.0 - 17.0 g/dL   HCT 62.9 52.8 - 41.3 %   MCV 83.7 80.0 - 100.0 fL   MCH 25.0 (L) 26.0 - 34.0 pg   MCHC 29.9 (L) 30.0 - 36.0 g/dL   RDW 24.4 01.0 - 27.2 %   Platelets 149 (L) 150 - 400 K/uL  nRBC 0.0 0.0 - 0.2 %    Comment: Performed at [facility], [address], [place], [place] [address]  Potassium     Status: Abnormal   Collection Time: 08/17/23  9:46 AM  Result Value Ref Range   Potassium 5.3 (H) 3.5 - 5.1 mmol/L    Comment: Performed at [facility], [address] [place], [place] [address]  Heparin level (unfractionated)     Status: None   Collection Time: 08/17/23  9:47 AM  Result Value Ref Range   Heparin Unfractionated 0.43 0.30 - 0.70 IU/mL    Comment: (NOTE) The clinical reportable range upper limit is being lowered to >1.10 to align with the FDA approved guidance for the current laboratory assay.  If heparin results are below expected values, and patient dosage has  been confirmed, suggest follow up testing of antithrombin III levels. Performed at [facility], [address], [place], [place] [address]   Glucose, capillary     Status: Abnormal   Collection Time: 08/17/23  1:01 PM  Result Value Ref Range   Glucose-Capillary 232 (H) 70 - 99 mg/dL    Comment: Glucose reference range applies only to samples taken after fasting for at least 8  hours.  Potassium     Status: Abnormal   Collection Time: 08/17/23  2:43 PM  Result Value Ref Range   Potassium 5.2 (H) 3.5 - 5.1 mmol/L    Comment: Performed at [facility], [address] [place], [place] [address]  Glucose, capillary     Status: Abnormal   Collection Time: 08/17/23  4:31 PM  Result Value Ref Range   Glucose-Capillary 127 (H) 70 - 99 mg/dL    Comment: Glucose reference range applies only to samples taken after fasting for at least 8 hours.  Glucose, capillary     Status: Abnormal   Collection Time: 08/17/23  8:35 PM  Result Value Ref Range   Glucose-Capillary 133 (H) 70 - 99 mg/dL    Comment: Glucose reference range applies only to samples taken after fasting for at least 8 hours.  Glucose, capillary     Status: Abnormal   Collection Time: 08/17/23 11:16 PM  Result Value Ref Range   Glucose-Capillary 134 (H) 70 - 99 mg/dL    Comment: Glucose reference range applies only to samples taken after fasting for at least 8 hours.  Magnesium     Status: Abnormal   Collection Time: 08/18/23  2:52 AM  Result Value Ref Range   Magnesium 3.1 (H) 1.7 - 2.4 mg/dL    Comment: Performed at [facility], [address] [place], [place] [address]  Phosphorus     Status: Abnormal   Collection Time: 08/18/23  2:52 AM  Result Value Ref Range   Phosphorus 8.3 (H) 2.5 - 4.6 mg/dL    Comment: Performed at [facility], [address] [place], [place] [address]  Basic metabolic panel     Status: Abnormal   Collection Time: 08/18/23  2:52 AM  Result Value Ref Range   Sodium 139 135 - 145 mmol/L   Potassium 6.6 (HH) 3.5 - 5.1 mmol/L    Comment: CRITICAL RESULT CALLED TO, READ BACK BY AND VERIFIED WITH A. WORKING, RN AT 03:39 09.22.24 JLASIGAN   Chloride 103 98 - 111 mmol/L   CO2 20 (L) 22 - 32 mmol/L   Glucose, Bld 122 (H) 70 - 99 mg/dL    Comment:  Glucose reference range applies only to samples taken after fasting for at least 8 hours.   BUN 86 (H) 8 - 23 mg/dL    Creatinine, Ser 9.52 (H) 0.61 - 1.24 mg/dL   Calcium 7.6 (L) 8.9 - 10.3 mg/dL   GFR, Estimated 11 (L) >60 mL/min    Comment: (NOTE) Calculated using the CKD-EPI Creatinine Equation (2021)    Anion gap 16 (H) 5 - 15    Comment: Performed at [facility], [address], [place], [place] [address]  CBC     Status: Abnormal   Collection Time: 08/18/23  2:58 AM  Result Value Ref Range   WBC 17.3 (H) 4.0 - 10.5 K/uL   RBC 4.68 4.22 - 5.81 MIL/uL   Hemoglobin 12.1 (L) 13.0 - 17.0 g/dL   HCT 44.0 10.2 - 72.5 %   MCV 86.1 80.0 - 100.0 fL   MCH 25.9 (L) 26.0 - 34.0 pg   MCHC 30.0 30.0 - 36.0 g/dL   RDW 36.6 44.0 - 34.7 %   Platelets 133 (L) 150 - 400 K/uL   nRBC 0.1 0.0 - 0.2 %    Comment: Performed at [facility], [address], [place], [place] [address]  Heparin level (unfractionated)     Status: None   Collection Time: 08/18/23  2:58 AM  Result Value Ref Range   Heparin Unfractionated 0.44 0.30 - 0.70 IU/mL    Comment: (NOTE) The clinical reportable range upper limit is being lowered to >1.10 to align with the FDA approved guidance for the current laboratory assay.  If heparin results are below expected values, and patient dosage has  been confirmed, suggest follow up testing of antithrombin III levels. Performed at [facility], [address], [place], [place] [address]   Glucose, capillary     Status: Abnormal   Collection Time: 08/18/23  4:48 AM  Result Value Ref Range   Glucose-Capillary 131 (H) 70 - 99 mg/dL    Comment: Glucose reference range applies only to samples taken after fasting for at least 8 hours.  Na and K (sodium & potassium), rand urine     Status: None   Collection Time: 08/18/23  5:35 AM  Result Value Ref Range   Sodium, Ur 96 mmol/L   Potassium Urine 35 mmol/L    Comment: Performed at [facility], [address], [place], [place] [address]  Basic metabolic panel     Status: Abnormal   Collection Time: 08/18/23  6:05 AM   Result Value Ref Range   Sodium 140 135 - 145 mmol/L   Potassium 6.5 (HH) 3.5 - 5.1 mmol/L    Comment: CRITICAL RESULT CALLED TO, READ BACK BY AND VERIFIED WITH J [facility] RN AT [id number] BY D LONG   Chloride 104 98 - 111 mmol/L   CO2 20 (L) 22 - 32 mmol/L   Glucose, Bld 128 (H) 70 - 99 mg/dL    Comment: Glucose reference range applies only to samples taken after fasting for at least 8 hours.   BUN 92 (H) 8 - 23 mg/dL   Creatinine, Ser 6.30 (H) 0.61 - 1.24 mg/dL   Calcium 7.6 (L) 8.9 - 10.3 mg/dL   GFR, Estimated 10 (L) >60 mL/min    Comment: (NOTE) Calculated using the CKD-EPI Creatinine Equation (2021)    Anion gap 16 (H) 5 - 15    Comment: Performed at [facility], [address] [place],  [place] [address]  Glucose, capillary     Status: Abnormal   Collection Time: 08/18/23  8:00 AM  Result Value Ref Range   Glucose-Capillary 123 (H) 70 - 99 mg/dL    Comment: Glucose reference range applies only to samples taken after fasting for at least 8 hours.    ECG   Normal sinus rhythm, inferior infarct pattern, borderline lateral ST depressions - Personally Reviewed  Telemetry   Sinus rhythm - Personally Reviewed  Radiology    DG CHEST PORT 1 VIEW  Result Date: 08/17/2023 CLINICAL DATA:  Dyspnea EXAM: PORTABLE CHEST 1 VIEW COMPARISON:  2023/09/08 chest CT FINDINGS: Temporary pacer looping at the lower right heart. Enteric tube with tip at least reaching the diaphragm. Endotracheal tube with tip at the clavicular heads. There is indistinct density at the lung bases primarily from pleural fluid and atelectasis by most recent chest CT. Stable heart size and mediastinal contours accounting for rotation. IMPRESSION: Stable hardware positioning and lower chest opacification when compared to chest CT from 2 days ago. Electronically Signed   By: Tiburcio Pea M.D.   On: 08/17/2023 03:58   DG Abd 1 View  Result Date: 08/16/2023 CLINICAL DATA:  Enteric tube placement. EXAM:  ABDOMEN - 1 VIEW COMPARISON:  09/08/23. FINDINGS: Distal tip of nasogastric tube is seen in expected position of proximal stomach. IMPRESSION: Distal tip of nasogastric tube is seen in expected position of proximal stomach. Electronically Signed   By: Lupita Raider M.D.   On: 08/16/2023 14:15   ECHOCARDIOGRAM COMPLETE  Result Date: 08/16/2023    ECHOCARDIOGRAM REPORT   Patient Name:   Antonio Westling [facility]. Date of Exam: 08/16/2023 Medical Rec #:  [id number]              Height:       72.0 in Accession #:    [id number]             Weight:       226.9 lb Date of Birth:  1956/11/21              BSA:          2.248 m Patient Age:    67 years               BP:           109/80 mmHg Patient Gender: M                      HR:           90 bpm. Exam Location:  Inpatient Procedure: 2D Echo, Cardiac Doppler and Color Doppler Indications:    Cardiac arrest I46.9  History:        Patient has prior history of Echocardiogram examinations. CAD,                 PAD, Arrythmias:Heart Block; Risk Factors:Hypertension,                 Diabetes, Dyslipidemia and Current Smoker.  Sonographer:    Dondra Prader RVT RCS Referring Phys: [id number] Cork[address] Crafts  Sonographer Comments: Technically challenging study due to limited acoustic windows. Supine position. IMPRESSIONS  1. Left ventricular ejection fraction, by estimation, is <20%. The left ventricle has severely decreased function. The left ventricle demonstrates global hypokinesis. There is mild asymmetric left ventricular hypertrophy of the basal-septal segment. Indeterminate diastolic filling due to E-A fusion.  2. Right ventricular systolic function is  severely reduced. The right ventricular size is normal.  3. The mitral valve is normal in structure. Trivial mitral valve regurgitation. No evidence of mitral stenosis. Moderate mitral annular calcification.  4. 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 There is no significant stenosis by Doppler but given severe LV dysfunction suspect a component of at least mild low-flow low gradient AS.Marland Kitchen The aortic valve has an indeterminant number of cusps. There is moderate calcification of the aortic valve. Aortic valve regurgitation is not visualized. No aortic stenosis is present.  5. The inferior vena cava is normal in size with greater than 50% respiratory variability, suggesting right atrial pressure of 3 mmHg. Comparison(s): 02/04/22-Left ventricular ejection fraction, by estimation, is 55 to 60%. The left ventricle has normal function. The left ventricle has no regional wall motion abnormalities.  FINDINGS  Left Ventricle: Left ventricular ejection fraction, by estimation, is <20%. The left ventricle has severely decreased function. The left ventricle demonstrates global hypokinesis. The left ventricular internal cavity size was normal in size. There is mild asymmetric left ventricular hypertrophy of the basal-septal segment. Indeterminate diastolic filling due to E-A fusion. Right Ventricle: The right ventricular size is normal. No increase in right ventricular wall thickness. Right ventricular systolic function is severely reduced. Left Atrium: Left atrial size was normal in size. Right Atrium: Right atrial size was normal in size. Pericardium: There is no evidence of pericardial effusion. Mitral Valve: The mitral valve is normal in structure. Moderate mitral annular calcification. Trivial mitral valve regurgitation. No evidence of mitral valve stenosis. Tricuspid Valve: The tricuspid valve is normal in structure. Tricuspid valve regurgitation is not demonstrated. No evidence of tricuspid stenosis. Aortic Valve: The aotic valve is moderately calcified, with restricted leaflet movement and appears to be functionally bicuspid. There is no significant stenosis by Doppler but given severe LV dysfunction suspect a component of at least mild low-flow low  gradient  AS. The aortic valve has an indeterminant number of cusps. There is moderate calcification of the aortic valve. Aortic valve regurgitation is not visualized. No aortic stenosis is present. Aortic valve mean gradient measures 3.0 mmHg. Aortic valve peak gradient measures 5.1 mmHg. Aortic valve area, by VTI measures 1.80 cm. Pulmonic Valve: The pulmonic valve was not well visualized. Pulmonic valve regurgitation is not visualized. No evidence of pulmonic stenosis. Aorta: The aortic root is normal in size and structure. Venous: The inferior vena cava is normal in size with greater than 50% respiratory variability, suggesting right atrial pressure of 3 mmHg. IAS/Shunts: No atrial level shunt detected by color flow Doppler.  LEFT VENTRICLE PLAX 2D LVIDd:         5.00 cm LVIDs:         4.60 cm LV PW:         0.90 cm LV IVS:        1.30 cm LVOT diam:     2.10 cm LV SV:         25 LV SV Index:   11 LVOT Area:     3.46 cm  RIGHT VENTRICLE          IVC RV Basal diam:  3.80 cm  IVC diam: 1.70 cm LEFT ATRIUM             Index        RIGHT ATRIUM           Index LA diam:  3.40 cm 1.51 cm/m   RA Area:     12.70 cm LA Vol (A2C):   39.8 ml 17.71 ml/m  RA Volume:   29.70 ml  13.21 ml/m LA Vol (A4C):   56.4 ml 25.09 ml/m LA Biplane Vol: 48.6 ml 21.62 ml/m  AORTIC VALVE AV Area (Vmax):    2.04 cm AV Area (Vmean):   1.90 cm AV Area (VTI):     1.80 cm AV Vmax:           113.00 cm/s AV Vmean:          73.400 cm/s AV VTI:            0.138 m AV Peak Grad:      5.1 mmHg AV Mean Grad:      3.0 mmHg LVOT Vmax:         66.40 cm/s LVOT Vmean:        40.200 cm/s LVOT VTI:          0.072 m LVOT/AV VTI ratio: 0.52  AORTA Ao Root diam: 2.40 cm  SHUNTS Systemic VTI:  0.07 m Systemic Diam: 2.10 cm Arvilla Meres MD Electronically signed by Arvilla Meres MD Signature Date/Time: 08/16/2023/12:54:23 PM    Final    Overnight EEG with video  Result Date: 08/16/2023 Charlsie Quest, MD     08/17/2023  7:04 AM Patient Name: Antonio Richards. MRN: [id number] Epilepsy Attending: Charlsie Quest Referring Physician/Provider: Cheri Fowler, MD Duration: 08/16/2023 0025 to 08/17/2023 0025 Patient history: 67yo M s/p cardiac arrest getting eeg to evaluate for seizure Level of alertness: comatose AEDs during EEG study: Versed, propofol Technical aspects: This EEG study was done with scalp electrodes positioned according to the 10-20 International system of electrode placement. Electrical activity was reviewed with band pass filter of 1-70Hz , sensitivity of 7 uV/mm, display speed of 45mm/sec with a 60Hz  notched filter applied as appropriate. EEG data were recorded continuously and digitally stored.  Video monitoring was available and reviewed as appropriate. Description: EEG showed continuous generalized 3 to 6 Hz theta-delta slowing admixed with an excessive amount of 15 to 18 Hz beta activity distributed symmetrically and diffusely. Hyperventilation and photic stimulation were not performed.   ABNORMALITY - Continuous slow, generalized - Excessive beta, generalized IMPRESSION: This study is suggestive of severe diffuse encephalopathy likely related to sedation. No seizures or epileptiform discharges were seen throughout the recording. Priyanka Annabelle Harman    Cardiac Studies   See echo result above  Assessment   Principal Problem:   Complete heart block (HCC) Active Problems:   Cardiac arrest (HCC)   Heart block AV complete (HCC)   Plan   Extubated on light sedation -currently on bipap. Hyperkalemic today - urine output is minimal and he is volume overloaded. Seen by Dr. Arlean Hopping - plans for CRRT today. Discussed situation with his son at bedside- would like to pursue cardiac cath tomorrow. Discussed the risks, benefits and alternatives of cath and he is willing to proceed. Specifically, the dye load of cath +/- PCI may lead to need for long-term or permanent dialysis. No rhythm issues overnight - ok to d/c temporary pacemaker  introducer per nursing today.  Informed Consent   Shared Decision Making/Informed Consent The risks [stroke (1 in 1000), death (1 in 1000), kidney failure [usually temporary] (1 in 500), bleeding (1 in 200), allergic reaction [possibly serious] (1 in 200)], benefits (diagnostic support and management of coronary artery disease) and alternatives of a cardiac catheterization  were discussed in detail with Mr. Steenstra and he is willing to proceed.     CRITICAL CARE TIME: I have spent a total of 35 minutes with patient reviewing hospital notes, telemetry, EKGs, labs and examining the patient as well as establishing an assessment and plan that was discussed with the patient.  > 50% of time was spent in direct patient care. The patient is critically ill with multi-organ system failure and requires high complexity decision making for assessment and support, frequent evaluation and titration of therapies, application of advanced monitoring technologies and extensive interpretation of multiple databases.   Length of Stay:  LOS: 3 days   Chrystie Nose, MD, [facility], FACP  [place]  [facility]  Medical Director of the [facility]  Cardiovascular Risk Reduction Clinic Diplomate of the American Board of Clinical Lipidology Attending Cardiologist  Direct Dial: [phone]  Fax: [phone]  Website:  [email or web address] 08/18/2023, 8:57 AM

## 2023-08-18 NOTE — Progress Notes (Signed)
NAME:  Antonio [name]., MRN:  [id number], DOB:  08-May-1956, LOS: 3 ADMISSION DATE:  08/07/2023, CONSULTATION DATE:  9/19 REFERRING MD:  Dr. Criss Alvine, CHIEF COMPLAINT:  cardiac arrest   History of Present Illness:  Patient is a 67 yo M w/ pertinent PMH of HTN, CAD s/p stent placement, PAD s/p L BKA and LSFA stenting 2013, T2DM, right foot osteomyelitis s/p right iliofemoral endarterectomy and right metatarsal amputation presents to [facility] ED on 9/19 w/ UTI.  Patient having UTI symptoms for the past 4-5 days. Also having b/l back pain and abd pain. Also having chronic right leg pain that has been progressively worsening over the last 4-5 months. On arrival patient alert and hypertensive 162/120. Afebrile and wbc 7.8. UA w/ leukocytes and uc pending. Started on rocephin. Patient also w/ creat 2.32. Patient taken to vascular lab and dvt US with no evidence of dvt and arterial duplex showing 75-99% stenosis in deep femoral artery and 50-74% stenosis in superficial femoral artery recommending vascular consult.   While in ED patient found became unresponsive w/ clinched teeth. Patient was pulseless and cpr started w/ rosc in 2 minutes. Attempted to intubated after code and very difficult airway and initially could not pass ETT. Patient coded during intubation procedure. 6.5 ETT was able to be placed and ROSC was achieved. Patient on levo and epi. Patient hr 50s. EKG showing complete AV block. Cards consulted and taking for emergent cath. PCCM consulted for icu admission.  Pertinent  Medical History   Past Medical History:  Diagnosis Date   Coronary artery disease    Diabetes mellitus without complication (HCC)    typ2   GERD (gastroesophageal reflux disease)    Hyperlipidemia    Hypertension    Osteomyelitis (HCC) 03/25/2017   RT FOOT     Significant Hospital Events: Including procedures, antibiotic start and stop dates in addition to other pertinent events   9/19 admitted initially w/ UTI;  while in ED pea arrest, difficult intubation. Complete heart block going to cath lab 9/21 extubated  Interim History / Subjective:  On BIPAP/precedex.  Intermittently following commands. Did not respond much to lasix challenge.  Objective   Blood pressure (!) 94/59, pulse 68, temperature 98.5 F (36.9 C), temperature source Axillary, resp. rate (!) 27, height 6' (1.829 m), weight 109.3 kg, SpO2 100%.    Vent Mode: PCV;BIPAP FiO2 (%):  [40 %-50 %] 40 % Set Rate:  [15 bmp] 15 bmp PEEP:  [5 cmH20] 5 cmH20 Pressure Support:  [5 cmH20] 5 cmH20   Intake/Output Summary (Last 24 hours) at 08/18/2023 0656 Last data filed at 08/18/2023 0534 Gross per 24 hour  Intake 776.38 ml  Output 217 ml  Net 559.38 ml   Filed Weights   08/16/23 0500 08/17/23 0500 08/18/23 0500  Weight: 102.9 kg 106.6 kg 109.3 kg    Examination: Chronically ill on BIPAP Moves to command when awoken 1+  edema Lungs mild rhonci Ext warm; stable chronic RLE ischemic changes  WBC drifting up K/Cr worse  Resolved Hospital Problem list     Assessment & Plan:   Inferior MI with PEA arrest- brief, seems neuro intact; tolerated ceftriaxone challenge, this was not allergic reaction Acute respiratory failure w/ hypoxia and hypercapnia- significant emphysema noted on CT; extubated 9/22, intermittent BIPAP need likely now due to volume Metabolic encephalopathy- an issue, query ICU delirium +/- uremic Difficult airway- due to prior trach and presumed subglottic stenosis CPR- induced rib fractures UTI AKI w/ hyperkalemia-  related to initial event, progressive, now w/ need for HD CAD HTNsive urgency- now on cleviprex pending PO access HLD PAD T2DM w/ hyperglycemia  - Check ammonia, encourage day/night cycles, precedex PRN to help BIPAP tolerance - Nephro consult for HD - Eventual need for VVS conslt for R femoral atherosclerotic disease, with AKI doubt anything can be done at present - GDMT per cardiology - Son  updated 9/21 at bedside, seems to want everything done  Best Practice (right click and "Reselect all SmartList Selections" daily)   Diet/type: will see how wakes up otherwise will need cortrak DVT prophylaxis: heparin gtt GI prophylaxis: H2B Lines: HD catheter Foley:  yes Code Status:  full code Last date of multidisciplinary goals of care discussion [updated son 9/21]  33 min cc time independent of procedures Myrla Halsted MD PCCM  Pulmonary & Critical Care 08/18/2023, 6:56 AM  Please see [email or web address] for pager details.  From 7A-7P if no response, please call [phone]. After hours, please call ELink [phone].

## 2023-08-18 NOTE — Consult Note (Signed)
Renal Service Consult Note [facility]  [name]. 08/18/2023 [name], MD Requesting Physician: Dr. Katrinka Blazing, D.   Reason for Consult: Renal failure  HPI: The patient is a 67 y.o. year-old w/ PMH as below who presented to ED w/ painful urination and flank pain, hx sig PAD w/ complications, HTN, amputee, DM2. In ED pt coded, and had difficult airway to intubate. Also in ED had heart block and cardiology saw the patient and diagnosed acute MI. Echo showed EF <20% w/ severe RV dysfunction as well. Temp venous pacing was done then no longer needed and was taken out. Pt will need heart cath probably tomorrow. Pt has AKI w/ creat 5 w/ hyperkalemia and volume excess. We are asked to see for CRRT.    Pt seen in ICU bed. On bipap, sleeping, did not respond to questions. No hx obtained.    ROS - n/a  Past Medical History  Past Medical History:  Diagnosis Date   Coronary artery disease    Diabetes mellitus without complication (HCC)    typ2   GERD (gastroesophageal reflux disease)    Hyperlipidemia    Hypertension    Osteomyelitis (HCC) 03/25/2017   RT FOOT   Past Surgical History  Past Surgical History:  Procedure Laterality Date   AMPUTATION Right 03/27/2017   Procedure: 1st Ray Amputation Right Foot;  Surgeon: Nadara Mustard, MD;  Location: [facility] OR;  Service: Orthopedics;  Laterality: Right;   APPENDECTOMY     BELOW KNEE LEG AMPUTATION Left    CARDIAC CATHETERIZATION     CORONARY STENT INTERVENTION  2005   ENDARTERECTOMY Right 01/01/2022   Procedure: RIGHT ILEOFEMORAL ENDARTERECTOMY with PATCH ANGIOPLASTY;  Surgeon: Victorino Sparrow, MD;  Location: [facility] OR;  Service: Vascular;  Laterality: Right;   Great toe amputation right.     GROIN DEBRIDEMENT Right 02/08/2022   Procedure: GROIN DEBRIDEMENT;  Surgeon: Victorino Sparrow, MD;  Location: [facility] OR;  Service: Vascular;  Laterality: Right;   I & D EXTREMITY Left 12/30/2015   Procedure: IRRIGATION AND DEBRIDEMENT LEFT  FOOT, TRANSMETATARSAL AMPUTATION WITH APPLICATION OF ANTIBIOTIC BEADS AND WOUND VAC;  Surgeon: Nadara Mustard, MD;  Location: [facility] OR;  Service: Orthopedics;  Laterality: Left;   IR GASTROSTOMY TUBE MOD SED  12/23/2018   LOWER EXTREMITY ANGIOGRAPHY Right 12/29/2021   Procedure: LOWER EXTREMITY ANGIOGRAPHY;  Surgeon: Leonie Douglas, MD;  Location: [facility] INVASIVE CV LAB;  Service: Cardiovascular;  Laterality: Right;   TEMPORARY PACEMAKER N/A 08/04/2023   Procedure: TEMPORARY PACEMAKER;  Surgeon: Corky Crafts, MD;  Location: [facility] INVASIVE CV LAB;  Service: Cardiovascular;  Laterality: N/A;   TONSILLECTOMY     TRACHEOSTOMY  11/2018   TRANSMETATARSAL AMPUTATION Right 01/01/2022   Procedure: RIGHT TRANSMETATARSAL AMPUTATION;  Surgeon: Victorino Sparrow, MD;  Location: [facility] OR;  Service: Vascular;  Laterality: Right;   Family History  Family History  Problem Relation Age of Onset   Diabetes Mother    Emphysema Father    CAD Sister 53       Died of MI after flu   Social History  reports that he has been smoking cigarettes. He started smoking about 39 years ago. He has a 70 pack-year smoking history. He has never used smokeless tobacco. He reports current alcohol use. He reports that he does not use drugs. Allergies  Allergies  Allergen Reactions   Iodinated Contrast Media Hives, Rash and Other (See Comments)    Blisters Staph Blisters /  staph   Penicillins Other (See Comments)    UNSPECIFIED REACTION  Note - tolerated ceftriaxone 08/16/23   Home medications Prior to Admission medications   Medication Sig Start Date End Date Taking? Authorizing Provider  acetaminophen (TYLENOL) 325 MG tablet Take 325-650 mg by mouth as needed for moderate pain.   Yes [provider]  amLODipine (NORVASC) 10 MG tablet Take 1 tablet (10 mg total) by mouth daily. Patient not taking: Reported on 08/17/2023 01/22/22 02/21/22  Angiulli, Mcarthur Rossetti, PA-C  aspirin EC 81 MG tablet Take 81 mg by mouth daily. Patient  not taking: Reported on 08/17/2023    [provider]  atorvastatin (LIPITOR) 40 MG tablet Take 1 tablet (40 mg total) by mouth daily at 6 PM. Patient not taking: Reported on 08/17/2023 01/22/22   Angiulli, Mcarthur Rossetti, PA-C  carvedilol (COREG) 25 MG tablet Take 1 tablet (25 mg total) by mouth 2 (two) times daily with a meal. Patient not taking: Reported on 08/17/2023 01/22/22 02/21/22  Angiulli, Mcarthur Rossetti, PA-C  diclofenac Sodium (VOLTAREN) 1 % GEL Apply 2 g topically 4 (four) times daily. Patient not taking: Reported on 08/17/2023 01/22/22   Angiulli, Mcarthur Rossetti, PA-C  furosemide (LASIX) 40 MG tablet Take 1 tablet (40 mg total) by mouth daily. Patient not taking: Reported on 08/17/2023 01/23/22 01/23/23  Angiulli, Mcarthur Rossetti, PA-C  hydrALAZINE (APRESOLINE) 25 MG tablet Take 1 tablet (25 mg total) by mouth every 8 (eight) hours. Patient not taking: Reported on 08/17/2023 01/22/22 02/21/22  Angiulli, Mcarthur Rossetti, PA-C  insulin detemir (LEVEMIR) 100 UNIT/ML injection Inject 0.24 mLs (24 Units total) into the skin at bedtime. Patient not taking: Reported on 08/17/2023 01/22/22   Charlton Amor, PA-C  Insulin Syringe-Needle U-100 30G X 1/2" 0.5 ML MISC Use with Levemir 01/23/22   Angiulli, Mcarthur Rossetti, PA-C  irbesartan (AVAPRO) 75 MG tablet Take 1 tablet (75 mg total) by mouth daily. Patient not taking: Reported on 08/17/2023 01/22/22 02/21/22  Angiulli, Mcarthur Rossetti, PA-C  nystatin-triamcinolone ointment [facility]) Apply 1 application. topically 2 (two) times daily. Apply to right superior groin area above incision for yeast infection. Patient not taking: Reported on 08/17/2023 03/12/22   Lars Mage, PA-C  oxyCODONE-acetaminophen (PERCOCET/ROXICET) 5-325 MG tablet Take 1 tablet by mouth every 6 (six) hours as needed for severe pain. Patient not taking: Reported on 08/17/2023 07/07/23   Linwood Dibbles, MD  pantoprazole (PROTONIX) 40 MG tablet Take 1 tablet (40 mg total) by mouth daily. Patient not taking: Reported on 08/17/2023  01/22/22   Angiulli, Mcarthur Rossetti, PA-C  phenazopyridine (PYRIDIUM) 200 MG tablet Take 1 tablet (200 mg total) by mouth 3 (three) times daily. Patient not taking: Reported on 08/17/2023 07/07/23   Linwood Dibbles, MD  polyethylene glycol (MIRALAX / GLYCOLAX) 17 g packet Take 17 g by mouth daily. Patient not taking: Reported on 08/17/2023 02/11/22   Atway, Rayann N, DO  insulin glargine (LANTUS) 100 UNIT/ML injection Inject 0.05 mLs (5 Units total) into the skin at bedtime. 03/28/17 02/20/19  Howard Pouch, MD     Vitals:   08/18/23 0900 08/18/23 0930 08/18/23 1000 08/18/23 1015  BP: 93/67 110/74 (!) 87/65 93/66  Pulse: 67 65 64   Resp: (!) 25 (!) 29 (!) 27 (!) 26  Temp:      TempSrc:      SpO2: 100% 94% 99%   Weight:      Height:       Exam Gen on bipap and precedex gtt No  rash, cyanosis or gangrene Sclera anicteric No jvd or bruits Chest clear anterior/ lateral RRR no RG Abd soft ntnd no mass or ascites +bs GU normal male w/ foley in place MS L BKA Ext diffuse nearly tight bilat hip/ thigh edema and R pretib edema, bilat UE edema 1-2+  Neuro is on bipap and sedated    Renal-related home meds: - norvasc 10 - carvedilol 25 bid - voltaren gel - lasix 40 every day - hydralazine 25 tid - irbesartan 75 daily     Date   Creat  eGFR   2017- 2019  0.50- 0.80   Jan- march 2020 0.62- 2.52 AKI episode, resolved   Feb- march 2023 0.53- 2.01 AKI episode, d/c creat 1.13   Jul 07, 2023  1.89  39 ml/min    9/19   2.32  30 ml/min   9/20   3.07   9/21   4.79   08/18/23  5.62       UA 12/28/21 - clear, prot > 300, rbc 0-5, wbc 0-5 UA 02/06/22 - hazy, prot 100, rbc 0-5, wbc 21- 50 UA 07/07/23 - cloudy, prot > 300, rbc 11-20, wbc > 50 UA 08/22/2023 - cloudy, prot > 300, rbc 21- 50, wbc > 50  UA 9/20 - prot > 300, rbc 21-50, wbc > 50   CXR 9/22 - IMPRESSION: New right IJ line without complication.  Increasing interstitial edema and bilateral effusions compatible with congestive heart failure. Bibasilar  airspace opacities likely reflect atelectasis.    CT a/p wo contrast - Adrenals/Urinary Tract: Adrenal glands are within normal limits. Kidneys are well visualized bilaterally. No renal calculi or obstructive changes are seen. The ureters are within normal limits.. Bladder is decompressed.    VS - BP's 80s- 100s, not on pressors yet, HR 70-80, RR 24- 32     Assessment/ Plan: AKI on CKD 3b - b/l creat 1.8 from aug '24, eGFR 39 ml/min. Creat here was 2.3 on admission and up to 5.6 today in the setting of septic and/or cardiogenic shock, hyperkalemia, acute MI, sp cardiac arrest. UA looks infected. CT abd shows no obstruction. Was taking ARB at home. Suspect AKI due to shock and hypoperfusion primarily. UOP is very low. Agree, needs CRRT.  Hyperkalemia - will start w/ low K+ fluids/ dialysate Acute inferior MI/ severe cardiomyopathy - EF < 20%, low RV function as well. Cardiology following, possible cath tomorrow.  Acute resp failure - per CCM.  Volume - mod to severe LE edema, and worsening pulm edema. On bipap. Get vol down w/ pressors and CRRT.  UTI - on IV abx PAD / L BKA SP cardiac arrest - in ED 9/19      Vinson Moselle  MD [facility] 08/18/2023, 10:55 AM  Recent Labs  Lab 08/14/2023 1316 08/16/2023 1535 08/16/2023 2110 08/16/23 0319 08/16/23 0350 08/17/23 0946 08/17/23 1443 08/18/23 0252 08/18/23 0258 08/18/23 0605  HGB 12.9*   < > 13.8  --    < > 12.6*  --   --  12.1*  --   ALBUMIN 2.4*  --  1.9*  --   --   --   --   --   --   --   CALCIUM 8.5*  --  8.4* 8.5*   < >  --   --  7.6*  --  7.6*  PHOS  --   --   --  5.1*  --   --   --  8.3*  --   --  CREATININE 2.32*   < > 2.83* 3.07*   < >  --   --  5.62*  --  5.87*  K 4.9   < > 4.7 5.8*   < > 5.3*   < > 6.6*  --  6.5*   < > = values in this interval not displayed.   Inpatient medications:  arformoterol  15 mcg Nebulization BID   ascorbic acid  250 mg Per Tube BID   aspirin  81 mg Per Tube Daily   atorvastatin  40 mg Per Tube Daily    budesonide (PULMICORT) nebulizer solution  0.25 mg Nebulization BID   Chlorhexidine Gluconate Cloth  6 each Topical Daily   doxycycline  100 mg Per Tube Q12H   famotidine  20 mg Per Tube Daily   feeding supplement (PROSource TF20)  60 mL Per Tube BID   insulin aspart  0-9 Units Subcutaneous Q4H   insulin glargine-yfgn  10 Units Subcutaneous Daily   methylPREDNISolone (SOLU-MEDROL) injection  40 mg Intravenous Q12H   multivitamin  1 tablet Per Tube QHS   mouth rinse  15 mL Mouth Rinse Q2H   revefenacin  175 mcg Nebulization Daily   sodium chloride flush  3 mL Intravenous Q12H     prismasol BGK 4/2.5      prismasol BGK 4/2.5     sodium chloride     sodium chloride 10 mL/hr at 08/17/23 2110   cefTRIAXone (ROCEPHIN)  IV     clevidipine Stopped (08/17/23 1902)   dexmedetomidine (PRECEDEX) IV infusion 1.2 mcg/kg/hr (08/18/23 1015)   epinephrine     feeding supplement (VITAL 1.5 CAL) Stopped (08/17/23 1059)   fentaNYL infusion INTRAVENOUS Stopped (08/17/23 0743)   heparin 1,850 Units/hr (08/17/23 2110)   norepinephrine (LEVOPHED) Adult infusion Stopped (08/16/23 1039)   prismasol BGK 4/2.5     propofol (DIPRIVAN) infusion Stopped (08/17/23 1059)   sodium chloride, acetaminophen, docusate, heparin, hydrALAZINE, ondansetron (ZOFRAN) IV, mouth rinse, polyethylene glycol, sodium chloride flush

## 2023-08-18 NOTE — Progress Notes (Signed)
   08/18/23 2025  BiPAP/CPAP/SIPAP  BiPAP/CPAP/SIPAP Pt Type Infant/Pediatric  BiPAP/CPAP/SIPAP SERVO  Mask Type Full face mask  Mask Size Medium  Set Rate 15 breaths/min  Respiratory Rate 30 breaths/min  IPAP 10 cmH20  EPAP 5 cmH2O  Pressure Support 10 cmH20  PEEP 5 cmH20  FiO2 (%) 40 %  Minute Ventilation 18.9  Peak Inspiratory Pressure (PIP) 14  Tidal Volume (Vt) 519  Patient Home Equipment No  Auto Titrate No  Press High Alarm 25 cmH2O

## 2023-08-18 NOTE — Procedures (Signed)
Central Venous Catheter Insertion Procedure Note  Zlatan Volkov  [id number]  09/02/1956  Date:08/18/23  Time:9:48 AM   Provider Performing:Caitlyne Ingham Salena Saner Katrinka Blazing   Procedure: Insertion of Non-tunneled Central Venous Catheter(36556)with US guidance (72536)    Indication(s) Hemodialysis  Consent Risks of the procedure as well as the alternatives and risks of each were explained to the patient and/or caregiver.  Consent for the procedure was obtained and is signed in the bedside chart  Anesthesia Topical only with 1% lidocaine   Timeout Verified patient identification, verified procedure, site/side was marked, verified correct patient position, special equipment/implants available, medications/allergies/relevant history reviewed, required imaging and test results available.  Sterile Technique Maximal sterile technique including full sterile barrier drape, hand hygiene, sterile gown, sterile gloves, mask, hair covering, sterile ultrasound probe cover (if used).  Procedure Description Area of catheter insertion was cleaned with chlorhexidine and draped in sterile fashion.   With real-time ultrasound guidance a HD catheter was placed into the right internal jugular vein.  Nonpulsatile blood flow and easy flushing noted in all ports.  The catheter was sutured in place and sterile dressing applied.  Complications/Tolerance None; patient tolerated the procedure well. Chest X-ray is ordered to verify placement for internal jugular or subclavian cannulation.  Chest x-ray is not ordered for femoral cannulation.  EBL Minimal  Specimen(s) None

## 2023-08-18 NOTE — Progress Notes (Signed)
ANTICOAGULATION CONSULT NOTE  Pharmacy Consult for heparin Indication: chest pain/ACS  Allergies  Allergen Reactions   Iodinated Contrast Media Hives, Rash and Other (See Comments)    Blisters Staph Blisters / staph   Penicillins Other (See Comments)    UNSPECIFIED REACTION  Note - tolerated ceftriaxone 08/16/23    Patient Measurements: Height: 6' (182.9 cm) Weight: 109.3 kg (240 lb 15.4 oz) IBW/kg (Calculated) : 77.6 Heparin Dosing Weight: 100kg  Vital Signs: Temp: 97.6 F (36.4 C) (09/22 0800) Temp Source: Axillary (09/22 0800) BP: 93/66 (09/22 1015) Pulse Rate: 64 (09/22 1000)  Labs: Recent Labs    08/08/2023 2110 08/16/23 0319 08/16/23 0350 08/16/23 1333 08/16/23 1901 08/17/23 0157 08/17/23 0946 08/17/23 0947 08/18/23 0252 08/18/23 0258 08/18/23 0605  HGB 13.8  --    < > 12.8*  --   --  12.6*  --   --  12.1*  --   HCT 46.8  --    < > 42.7  --   --  42.2  --   --  40.3  --   PLT 391  --    < > 186  --   --  149*  --   --  133*  --   HEPARINUNFRC  --   --   --   --    < > 0.18*  --  0.43  --  0.44  --   CREATININE 2.83* 3.07*  --   --    < > 4.79*  --   --  5.62*  --  5.87*  TROPONINIHS 6,747* >24,000*  --   --   --   --   --   --   --   --   --    < > = values in this interval not displayed.    Estimated Creatinine Clearance: 16 mL/min (A) (by C-G formula based on SCr of 5.87 mg/dL (H)).   Medical History: Past Medical History:  Diagnosis Date   Coronary artery disease    Diabetes mellitus without complication (HCC)    typ2   GERD (gastroesophageal reflux disease)    Hyperlipidemia    Hypertension    Osteomyelitis (HCC) 03/25/2017   RT FOOT     Assessment: 75 yoM admitted with urinary symptoms. Pt subsequently had PEA arrest and noted to have AV block with elevated troponins. Pharmacy to start IV heparin for ACS r/o. CBC ok, no AC PTA.   Heparin level therapeutic at 0.43. H/H ok, pltc trending down.  Goal of Therapy:  Heparin level 0.3-0.7  units/ml Monitor platelets by anticoagulation protocol: Yes   Plan:  Heparin 1850 units/h Daily heparin level, CBC  Fredonia Highland, PharmD, [place], [facility] Clinical Pharmacist [phone] Please check AMION for all [facility] Pharmacy numbers 08/18/2023

## 2023-08-18 NOTE — Plan of Care (Signed)
  Problem: Cardiovascular: Goal: Ability to achieve and maintain adequate cardiovascular perfusion will improve Outcome: Progressing Goal: Vascular access site(s) Level 0-1 will be maintained Outcome: Progressing   Problem: Clinical Measurements: Goal: Ability to maintain clinical measurements within normal limits will improve Outcome: Progressing Goal: Will remain free from infection Outcome: Progressing Goal: Diagnostic test results will improve Outcome: Not Progressing Goal: Respiratory complications will improve Outcome: Not Progressing Goal: Cardiovascular complication will be avoided Outcome: Progressing   Problem: Activity: Goal: Risk for activity intolerance will decrease Outcome: Not Progressing   Problem: Nutrition: Goal: Adequate nutrition will be maintained Outcome: Not Progressing   Problem: Coping: Goal: Level of anxiety will decrease Outcome: Not Progressing   Problem: Elimination: Goal: Will not experience complications related to bowel motility Outcome: Progressing Goal: Will not experience complications related to urinary retention Outcome: Progressing   Problem: Pain Managment: Goal: General experience of comfort will improve Outcome: Progressing   Problem: Safety: Goal: Ability to remain free from injury will improve Outcome: Progressing

## 2023-08-18 NOTE — Progress Notes (Signed)
LTM EEG discontinued - no skin breakdown at unhook.  

## 2023-08-18 NOTE — Procedures (Addendum)
Patient Name: Draymond Sump [facility].  MRN: [id number]  Epilepsy Attending: Charlsie Quest  Referring Physician/Provider: Cheri Fowler, MD  Duration: 08/18/2023 0025 to 08/17/2023 0732   Patient history: 67yo M s/p cardiac arrest getting eeg to evaluate for seizure   Level of alertness: awake, asleep   AEDs during EEG study: None   Technical aspects: This EEG study was done with scalp electrodes positioned according to the 10-20 International system of electrode placement. Electrical activity was reviewed with band pass filter of 1-70Hz , sensitivity of 7 uV/mm, display speed of 100mm/sec with a 60Hz  notched filter applied as appropriate. EEG data were recorded continuously and digitally stored.  Video monitoring was available and reviewed as appropriate.   Description: EEG showed continuous generalized 5-hz theta slowing. Sleep was characterized by sleep spindles (12-14hz ) maximal fronto-central region. Hyperventilation and photic stimulation were not performed.      ABNORMALITY - Continuous slow, generalized   IMPRESSION: This study is suggestive of mild to moderate diffuse encephalopathy. No seizures or epileptiform discharges were seen throughout the recording.   Yardley Lekas Annabelle Harman

## 2023-08-18 NOTE — Progress Notes (Addendum)
eLink Physician-Brief Progress Note Patient Name: Antonio [name]. DOB: 06/20/1956 MRN: [id number]   Date of Service  08/18/2023  HPI/Events of Note  K at 6.6, not hemolyzed. Was at 5.2. consistently over 5.5. received hyperkalemia Rx yesterday. NPO, on BiPAP.   Cr 5.5 Co2 at 20  Cardiology in for CHB. S/p cardiac arrest. In hospital since 19 th.  Biventricular failure.   Camera: Not  On pressors. Latest MAP 78. Prn lasix. Tolerating BiPAP.    eICU Interventions  Look like type 4 RTA.  Hyperkalemia treatment ordered ( kayexalate PR, insuline-dextrose. Follow BMP. Calcium gluconate 1 gm. Did not give albuterol nebs.  Consider Nephrology consult in AM.      Intervention Category Intermediate Interventions: Electrolyte abnormality - evaluation and management  Ranee Gosselin 08/18/2023, 3:55 AM

## 2023-08-18 NOTE — Procedures (Signed)
I have reviewed the CRRT session and made appropriate changes.  Vinson Moselle MD  [facility] 08/18/2023, 11:28 AM

## 2023-08-19 ENCOUNTER — Inpatient Hospital Stay (HOSPITAL_COMMUNITY): Admission: EM | Disposition: E | Payer: Self-pay | Source: Home / Self Care | Attending: Internal Medicine

## 2023-08-19 DIAGNOSIS — N1832 Chronic kidney disease, stage 3b: Secondary | ICD-10-CM

## 2023-08-19 DIAGNOSIS — N179 Acute kidney failure, unspecified: Secondary | ICD-10-CM

## 2023-08-19 DIAGNOSIS — I442 Atrioventricular block, complete: Secondary | ICD-10-CM | POA: Diagnosis not present

## 2023-08-19 DIAGNOSIS — I214 Non-ST elevation (NSTEMI) myocardial infarction: Secondary | ICD-10-CM | POA: Diagnosis not present

## 2023-08-19 DIAGNOSIS — R570 Cardiogenic shock: Secondary | ICD-10-CM | POA: Diagnosis not present

## 2023-08-19 LAB — RENAL FUNCTION PANEL
Albumin: 2 g/dL — ABNORMAL LOW (ref 3.5–5.0)
Albumin: 2 g/dL — ABNORMAL LOW (ref 3.5–5.0)
Anion gap: 14 (ref 5–15)
Anion gap: 10 (ref 5–15)
BUN: 56 mg/dL — ABNORMAL HIGH (ref 8–23)
BUN: 43 mg/dL — ABNORMAL HIGH (ref 8–23)
CO2: 20 mmol/L — ABNORMAL LOW (ref 22–32)
CO2: 25 mmol/L (ref 22–32)
Calcium: 7.2 mg/dL — ABNORMAL LOW (ref 8.9–10.3)
Calcium: 7.3 mg/dL — ABNORMAL LOW (ref 8.9–10.3)
Chloride: 103 mmol/L (ref 98–111)
Chloride: 100 mmol/L (ref 98–111)
Creatinine, Ser: 3.3 mg/dL — ABNORMAL HIGH (ref 0.61–1.24)
Creatinine, Ser: 2.65 mg/dL — ABNORMAL HIGH (ref 0.61–1.24)
GFR, Estimated: 20 mL/min — ABNORMAL LOW (ref >60)
GFR, Estimated: 26 mL/min — ABNORMAL LOW (ref >60)
Glucose, Bld: 132 mg/dL — ABNORMAL HIGH (ref 70–99)
Glucose, Bld: 100 mg/dL — ABNORMAL HIGH (ref 70–99)
Phosphorus: 5.8 mg/dL — ABNORMAL HIGH (ref 2.5–4.6)
Phosphorus: 5 mg/dL — ABNORMAL HIGH (ref 2.5–4.6)
Potassium: 4.8 mmol/L (ref 3.5–5.1)
Potassium: 4.1 mmol/L (ref 3.5–5.1)
Sodium: 137 mmol/L (ref 135–145)
Sodium: 135 mmol/L (ref 135–145)

## 2023-08-19 LAB — GLUCOSE, CAPILLARY
Glucose-Capillary: 101 mg/dL — ABNORMAL HIGH (ref 70–99)
Glucose-Capillary: 111 mg/dL — ABNORMAL HIGH (ref 70–99)
Glucose-Capillary: 133 mg/dL — ABNORMAL HIGH (ref 70–99)
Glucose-Capillary: 180 mg/dL — ABNORMAL HIGH (ref 70–99)
Glucose-Capillary: 200 mg/dL — ABNORMAL HIGH (ref 70–99)
Glucose-Capillary: 88 mg/dL (ref 70–99)

## 2023-08-19 LAB — CBC
HCT: 40.7 % (ref 39.0–52.0)
Hemoglobin: 12.5 g/dL — ABNORMAL LOW (ref 13.0–17.0)
MCH: 25.9 pg — ABNORMAL LOW (ref 26.0–34.0)
MCHC: 30.7 g/dL (ref 30.0–36.0)
MCV: 84.4 fL (ref 80.0–100.0)
Platelets: 113 10*3/uL — ABNORMAL LOW (ref 150–400)
RBC: 4.82 MIL/uL (ref 4.22–5.81)
RDW: 15.6 % — ABNORMAL HIGH (ref 11.5–15.5)
WBC: 11.8 10*3/uL — ABNORMAL HIGH (ref 4.0–10.5)
nRBC: 0.2 % (ref 0.0–0.2)

## 2023-08-19 LAB — MAGNESIUM: Magnesium: 2.9 mg/dL — ABNORMAL HIGH (ref 1.7–2.4)

## 2023-08-19 LAB — POTASSIUM
Potassium: 4.6 mmol/L (ref 3.5–5.1)
Potassium: 4.4 mmol/L (ref 3.5–5.1)
Potassium: 4.1 mmol/L (ref 3.5–5.1)

## 2023-08-19 LAB — HEPARIN LEVEL (UNFRACTIONATED): Heparin Unfractionated: 0.28 IU/mL — ABNORMAL LOW (ref 0.30–0.70)

## 2023-08-19 SURGERY — RIGHT/LEFT HEART CATH AND CORONARY ANGIOGRAPHY
Anesthesia: LOCAL

## 2023-08-19 MED ORDER — ASPIRIN 81 MG PO CHEW
81.0000 mg | CHEWABLE_TABLET | ORAL | Status: AC
  Administered 2023-08-19: 81 mg via ORAL
  Filled 2023-08-19: qty 1

## 2023-08-19 MED ORDER — ENSURE ENLIVE PO LIQD
237.0000 mL | Freq: Three times a day (TID) | ORAL | Status: DC
  Administered 2023-08-19 – 2023-08-24 (×10): 237 mL via ORAL

## 2023-08-19 MED ORDER — HEPARIN SODIUM (PORCINE) 5000 UNIT/ML IJ SOLN
5000.0000 [IU] | Freq: Three times a day (TID) | INTRAMUSCULAR | Status: DC
  Administered 2023-08-19 – 2023-08-24 (×15): 5000 [IU] via SUBCUTANEOUS
  Filled 2023-08-19 (×13): qty 1

## 2023-08-19 MED ORDER — ASPIRIN 81 MG PO CHEW: 81.0000 mg | CHEWABLE_TABLET | ORAL | Status: DC

## 2023-08-19 MED ORDER — DIPHENHYDRAMINE HCL 50 MG/ML IJ SOLN: 25.0000 mg | Freq: Once | INTRAMUSCULAR | Status: DC

## 2023-08-19 MED ORDER — HYDROMORPHONE HCL 1 MG/ML IJ SOLN
0.5000 mg | INTRAMUSCULAR | Status: DC | PRN
  Administered 2023-08-19 (×3): 0.5 mg via INTRAVENOUS
  Filled 2023-08-19 (×3): qty 0.5

## 2023-08-19 MED ORDER — SODIUM CHLORIDE 0.9 % IV SOLN: INTRAVENOUS | Status: DC

## 2023-08-19 MED ORDER — RENA-VITE PO TABS
1.0000 | ORAL_TABLET | Freq: Two times a day (BID) | ORAL | Status: DC
  Administered 2023-08-19 – 2023-08-24 (×9): 1 via ORAL
  Filled 2023-08-19 (×9): qty 1

## 2023-08-19 NOTE — Progress Notes (Signed)
Patient Name: Antonio [name]. Date of Encounter: 08/13/2023 [facility] Cardiologist: Antonio Rotunda, MD   Interval Summary  .    On CRRT. Awake, oriented. Oliguric (250 mL urine output overnight).  Vital Signs .    Vitals:   07/30/2023 0700 08/01/2023 0716 08/07/2023 0721 08/03/2023 0738  BP: 119/71 117/73  109/68  Pulse: 63 64  (!) 58  Resp: (!) 28 (!) 28  (!) 24  Temp:      TempSrc:      SpO2: 98% 100% 100% 96%  Weight:      Height:        Intake/Output Summary (Last 24 hours) at 08/02/2023 0755 Last data filed at 08/12/2023 0700 Gross per 24 hour  Intake 1864.63 ml  Output 3952 ml  Net -2087.37 ml      08/18/2023    3:42 AM 08/18/2023    5:00 AM 08/17/2023    5:00 AM  Last 3 Weights  Weight (lbs) 230 lb 13.2 oz 240 lb 15.4 oz 235 lb 0.2 oz  Weight (kg) 104.7 kg 109.3 kg 106.6 kg      Telemetry/ECG    Sinus rhythm with occasional PACs and rare blocked PACs- Personally Reviewed  Physical Exam .   GEN: No acute distress.   Neck: No JVD.  Right IJ dialysis catheter Cardiac: RRR, no murmurs, rubs, or gallops.  Respiratory: Clear to auscultation bilaterally. GI: Soft, nontender, non-distended  MS: No edema  Assessment & Plan .     67 year old man with longstanding insulin requiring diabetes and severe PAD including previous limb amputation versus left AKA, right great toe amputation), presenting with dysuria and bilateral back pain who developed cardiac arrest in the emergency room (bradycardia versus PEA), subsequently requiring intubation and pressor support, with protracted period of complete heart block recurrent episodes of nonsustained VT, requiring temporary transvenous pacing.  Subsequently found to have severe elevation in high-sensitivity troponin and severe biventricular dysfunction with left ventricular ejection fraction less than 20%.  He was not severely hyperkalemic at presentation, although hyperkalemia subsequently became an issue.  He  developed severe oliguria and acute on chronic renal failure requiring CRRT (creatinine at baseline appears to be in the 1.2-1.8 range).  No longer requires transvenous pacing.  Coronary status is currently not clear.  Reportedly had a stent in the 1990s.  No recent coronary angiogram.  He has not been seen as an outpatient in the cardiology office since 2019.  At that time a nuclear stress test showed moderately decreased LVEF 40% with a mostly fixed perfusion abnormalities: severe inferolateral perfusion abnormality and a smaller apical perfusion defect.  Echocardiograms in February and March 2023 showed normal LVEF 55 to 60%.  On my review of his echocardiogram, although the study was interpreted as showing global hypokinesis, there is evidence of akinesis, thinning and hyperechogenicity of the inferior and inferolateral wall (consistent with an extensive scar), but there is preserved function in the anterior wall and anterior septum.  The inferior vena cava was not dilated consistent with low right heart filling pressures.  The most likely scenario my opinion is that he had septic shock and the subsequent hypotension led to both the acute myocardial infarction of the inferior/inferolateral wall, where he probably had very tenuous arterial supply as well as the acute kidney injury.  At this point, there does not appear to be a pressing reason to proceed with urgent coronary angiography, since he is hemodynamically stable and no longer has conduction abnormalities.  I do not think it would be the best next step for him to go to coronary angiography or have any other contrast based procedures right now.  There is still a chance we may see some recovery of renal function.  I would delay cardiac catheterization until the future of kidney function becomes more clear.  Discussed this with Antonio [name] and his son Antonio [name].  CRITICAL CARE Performed by: Antonio [name]   Total critical care time: 50  minutes  Critical care time was exclusive of separately billable procedures and treating other patients.  Critical care was necessary to treat or prevent imminent or life-threatening deterioration.  Critical care was time spent personally by me on the following activities: development of treatment plan with patient and/or surrogate as well as nursing, discussions with consultants, evaluation of patient's response to treatment, examination of patient, obtaining history from patient or surrogate, ordering and performing treatments and interventions, ordering and review of laboratory studies, ordering and review of radiographic studies, pulse oximetry and re-evaluation of patient's condition.    For questions or updates, please contact [facility] Please consult [email or web address] for contact info under        Signed, Antonio Fair, MD

## 2023-08-19 NOTE — TOC Initial Note (Signed)
Transition of Care (TOC) - Initial/Assessment Note    Patient Details  Name: Antonio [name]. MRN: [id number] Date of Birth: 03-Dec-1955  Transition of Care [facility]) CM/SW Contact:    Elliot Cousin, RN Phone Number: [phone] 08/20/2023, 5:43 PM  Clinical Narrative:    CM spoke to pt's son, [name]. Pt lives with son/daughter at home. Pt was not going to PCP appt. Pt will need new PCP. Will need PT/OT evaluation and recommendation. Will continue to follow for dc needs as pt progresses.               Expected Discharge Plan: Home w Home Health Services Barriers to Discharge: Continued Medical Work up   Patient Goals and CMS Choice            Expected Discharge Plan and Services   Discharge Planning Services: CM Consult   Living arrangements for the past 2 months: Single Family Home                                      Prior Living Arrangements/Services Living arrangements for the past 2 months: Single Family Home Lives with:: Adult Children Patient language and need for interpreter reviewed:: Yes            Current home services: DME (scooter, shower chair)    Activities of Daily Living      Permission Sought/Granted Permission sought to share information with : Case Manager, Family Supports, PCP Permission granted to share information with : Yes, Verbal Permission Granted  Share Information with NAME: Yusef Such  Permission granted to share info w AGENCY: PCP  Permission granted to share info w Relationship: son  Permission granted to share info w Contact Information: [phone]  Emotional Assessment   Attitude/Demeanor/Rapport: Unable to Assess       Psych Involvement: No (comment)  Admission diagnosis:  Complete heart block (HCC) [I44.2] Patient Active Problem List   Diagnosis Date Noted   Cardiac arrest (HCC) 20-Aug-2023   Heart block AV complete (HCC) 20-Aug-2023   Complete heart block (HCC) 08/20/23   Acute heart failure  with preserved ejection fraction (HFpEF) (HCC)    Wound dehiscence 02/03/2022   AKI (acute kidney injury) (HCC)    Benign essential HTN    Uncontrolled type 2 diabetes mellitus with hyperglycemia (HCC)    S/P transmetatarsal amputation of foot, 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/09/2022   Preoperative cardiovascular examination    PVD (peripheral vascular disease) (HCC)    Osteomyelitis (HCC) 12/28/2021   Airway trauma    Acute respiratory failure (HCC)    Dysphagia    Status post tracheostomy (HCC)    HCAP (healthcare-associated pneumonia)    Diabetes mellitus type 2 in nonobese (HCC)    Hypertension    S/P BKA (below knee amputation) unilateral, left (HCC)    Tachypnea    Pain    Agitation    Acute blood loss anemia    Phlegm in throat    Fever, unspecified    Tachycardia    Acute on chronic respiratory failure (HCC)    Hypoxemia    Acute pulmonary edema (HCC)    Hypoxia    Tracheostomy status (HCC)    Pneumonia    Influenza A virus present    Pressure injury of skin 12/01/2018   SIRS (systemic inflammatory response syndrome) (HCC) 11/28/2018   Acute respiratory failure with hypoxemia (HCC) 11/28/2018  Influenza A    Hyponatremia    Chest pain 05/02/2018   Hyperlipidemia 05/02/2018   Tobacco abuse 05/02/2018   Coronary artery disease involving native coronary artery of native heart with angina pectoris (HCC) 05/02/2018   S/P transmetatarsal amputation of foot, left (HCC) 04/15/2017   Amputated great toe of right foot (HCC) 04/15/2017   Ulcer of toe of left foot, limited to breakdown of skin (HCC) 04/04/2017   Partial nontraumatic amputation of foot, right (HCC) 04/04/2017   Type 2 diabetes mellitus with hyperglycemia, with long-term current use of insulin (HCC)    Edema    Wound infection after surgery    PCP:  Erskine Emery, NP Pharmacy:   [facility] - [place], [place] - [address] [place] [address] Phone: [phone] Fax: [phone]  [facility] [address] [place] [address] Phone: [phone] Fax: [phone]  [id number] - [place], [place] - [address] [place] [address] Phone: [phone] Fax: [phone]  [id number] [place], [place] - [address] AT [facility] [address] [place] [address] Phone: [phone] Fax: [phone]  [id number] - [address], [place] - [address] [facility] [place] [address] Phone: [phone] Fax: [phone]     Social Determinants of Health (SDOH) Social History: SDOH Screenings   Tobacco Use: High Risk (07/07/2023)   SDOH Interventions:     Readmission Risk Interventions    01/09/2022   11:29 AM  Readmission Risk Prevention Plan  Transportation Screening Complete  PCP or Specialist Appt within 5-7 Days --  Home Care Screening Complete  Medication Review (RN CM) Complete

## 2023-08-19 NOTE — Consult Note (Addendum)
and popliteal diesase with monophasic waveforms.   DVT study RLE 08/03/2023 negative for DVT   ASSESSMENT/PLAN: This is a 67 y.o. male who presented to the hospital with with UTI sx.  In the ED, he became unresponsive and received CPR and required levophed and Epi.  He is now extubated and on CRRT.  He will need cardiac catheterization.  Vascular surgery is consulted for lower extremity wounds.    Pt has hx of right iliofemoral endarterectomy with bovine patch angioplasty and right TMA on 01/01/2022 by Dr. Karin Lieu.  On 02/08/2022, he underwent right groin washout debridement with myriad morcell and myriad matrix placement by Dr. Karin Lieu. 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    -pt with hx of the above.  His only runoff is right ATA and he does have + doppler signal present right ATA.  He will need arteriogram, however, he has other issues currently that are more pressing.  He did have arterial duplex on 9/19 that revealed 75-99% stenosis in the deep femoral artery and 50-74% stenosis in the right SFA.   -his left BKA stump has an ulceration and appears ischemic.  This will need to demarcate but may need revision to AKA depending on demarcation.   -he is currently on CRRT.  He will  need cardiac catheterization in the near future.   -Dr. Chestine Spore to evaluate pt and determine further plan -we will continue to follow.    Doreatha Massed, PA-C [facility] [phone]   I have seen and evaluated the patient. I agree with the PA note as documented above.  67 year old male admitted with sepsis as well as complete heart block.  Ultimately had a cardiac  arrest in the ED requiring CPR.  Was intubated on pressors in the ICU.  Vascular surgery has been consulted for chronic right lower extremity ischemia.  He is well-known to our practice and has had a right femoral endarterectomy with patch angioplasty and right TMA on 01/01/2022 by Dr. Sherral Hammers.  Patient is now extubated.  He has an ulcer on his right TMA that he states has been there for a long time prior to his hospitalization.  Also has an ischemic looking left BKA that was done at [facility].  He also tells me that this has been this way for a long time as well.  We will follow.  He does have a monophasic AT signal on the right.  Likely would need right leg angiogram in the future.  Will see if he has any renal recovery.  I also discussed if his left BKA worsens may need conversion to an AKA on that side.  Discussing with critical care they feel that his next step would be monitoring for renal recovery and then he needs cardiac cath and is being followed by cardiology.  Vascular will follow but again this all appears chronic.  Cephus Shelling, MD [facility] [place] Office: [phone]  Hospital Consult    Reason for Consult:  lower extremity wounds/chronic ischemia Requesting Physician:  Katrinka Blazing MRN #:  [id number]  History of Present Illness: This is a 67 y.o. male who presented to the hospital with with UTI sx.  In the ED, he became unresponsive and received CPR and required levophed and Epi.  He is now extubated and on CRRT.  He will need cardiac catheterization.  Vascular surgery is consulted for lower extremity wounds.    Pt has hx of right iliofemoral endarterectomy with bovine patch angioplasty and right TMA on 01/01/2022 by Dr. Karin Lieu.  On 02/08/2022, he underwent right groin washout debridement with myriad morcell and myriad matrix placement by Dr. Karin Lieu. Pt has left BKA that he states was done a couple of years ago in [facility].  He states he does use a prosthesis.  He states the left BKA was bruised when he came in the hospital.  He c/o some pain in the left BKA.    The pt is on a statin for cholesterol management.  The pt is on a daily aspirin.   Other AC:  sq heparin The pt is on CCB, hydralazine, ARB, diuretic for hypertension PTA The pt is on medication for diabetes PTA. Tobacco hx:  current  Past Medical History:  Diagnosis Date   Coronary artery disease    Diabetes mellitus without complication (HCC)    typ2   GERD (gastroesophageal reflux disease)    Hyperlipidemia    Hypertension    Osteomyelitis (HCC) 03/25/2017   RT FOOT    Past Surgical History:  Procedure Laterality Date   AMPUTATION Right 03/27/2017   Procedure: 1st Ray Amputation Right Foot;  Surgeon: Nadara Mustard, MD;  Location: [facility] OR;  Service: Orthopedics;  Laterality: Right;   APPENDECTOMY     BELOW KNEE LEG AMPUTATION Left    CARDIAC CATHETERIZATION     CORONARY STENT INTERVENTION  2005   ENDARTERECTOMY Right 01/01/2022   Procedure: RIGHT ILEOFEMORAL ENDARTERECTOMY with PATCH ANGIOPLASTY;  Surgeon: Victorino Sparrow, MD;  Location: [facility] OR;  Service: Vascular;  Laterality: Right;   Great  toe amputation right.     GROIN DEBRIDEMENT Right 02/08/2022   Procedure: GROIN DEBRIDEMENT;  Surgeon: Victorino Sparrow, MD;  [facility] OR;  Service: Vascular;  Laterality: Right;   I & D EXTREMITY Left 12/30/2015   Procedure: IRRIGATION AND DEBRIDEMENT LEFT FOOT, TRANSMETATARSAL AMPUTATION WITH APPLICATION OF ANTIBIOTIC BEADS AND WOUND VAC;  Surgeon: Nadara Mustard, MD;  Location: [facility] OR;  Service: Orthopedics;  Laterality: Left;   IR GASTROSTOMY TUBE MOD SED  12/23/2018   LOWER EXTREMITY ANGIOGRAPHY Right 12/29/2021   Procedure: LOWER EXTREMITY ANGIOGRAPHY;  Surgeon: Leonie Douglas, MD;  Location: [facility] INVASIVE CV LAB;  Service: Cardiovascular;  Laterality: Right;   TEMPORARY PACEMAKER N/A 07/28/2023   Procedure: TEMPORARY PACEMAKER;  Surgeon: Corky Crafts, MD;  Location: [facility] INVASIVE CV LAB;  Service: Cardiovascular;  Laterality: N/A;   TONSILLECTOMY     TRACHEOSTOMY  11/2018   TRANSMETATARSAL AMPUTATION Right 01/01/2022   Procedure: RIGHT TRANSMETATARSAL AMPUTATION;  Surgeon: Victorino Sparrow, MD;  Location: [facility] OR;  Service: Vascular;  Laterality: Right;    Allergies  Allergen Reactions   Iodinated Contrast Media Hives, Rash and Other (See Comments)    Blisters Staph Blisters / staph   Penicillins Other (See Comments)    UNSPECIFIED REACTION  Note - tolerated ceftriaxone 08/16/23    Prior to Admission medications   Medication Sig  and popliteal diesase with monophasic waveforms.   DVT study RLE 08/03/2023 negative for DVT   ASSESSMENT/PLAN: This is a 67 y.o. male who presented to the hospital with with UTI sx.  In the ED, he became unresponsive and received CPR and required levophed and Epi.  He is now extubated and on CRRT.  He will need cardiac catheterization.  Vascular surgery is consulted for lower extremity wounds.    Pt has hx of right iliofemoral endarterectomy with bovine patch angioplasty and right TMA on 01/01/2022 by Dr. Karin Lieu.  On 02/08/2022, he underwent right groin washout debridement with myriad morcell and myriad matrix placement by Dr. Karin Lieu. 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    -pt with hx of the above.  His only runoff is right ATA and he does have + doppler signal present right ATA.  He will need arteriogram, however, he has other issues currently that are more pressing.  He did have arterial duplex on 9/19 that revealed 75-99% stenosis in the deep femoral artery and 50-74% stenosis in the right SFA.   -his left BKA stump has an ulceration and appears ischemic.  This will need to demarcate but may need revision to AKA depending on demarcation.   -he is currently on CRRT.  He will  need cardiac catheterization in the near future.   -Dr. Chestine Spore to evaluate pt and determine further plan -we will continue to follow.    Doreatha Massed, PA-C [facility] [phone]   I have seen and evaluated the patient. I agree with the PA note as documented above.  67 year old male admitted with sepsis as well as complete heart block.  Ultimately had a cardiac  arrest in the ED requiring CPR.  Was intubated on pressors in the ICU.  Vascular surgery has been consulted for chronic right lower extremity ischemia.  He is well-known to our practice and has had a right femoral endarterectomy with patch angioplasty and right TMA on 01/01/2022 by Dr. Sherral Hammers.  Patient is now extubated.  He has an ulcer on his right TMA that he states has been there for a long time prior to his hospitalization.  Also has an ischemic looking left BKA that was done at [facility].  He also tells me that this has been this way for a long time as well.  We will follow.  He does have a monophasic AT signal on the right.  Likely would need right leg angiogram in the future.  Will see if he has any renal recovery.  I also discussed if his left BKA worsens may need conversion to an AKA on that side.  Discussing with critical care they feel that his next step would be monitoring for renal recovery and then he needs cardiac cath and is being followed by cardiology.  Vascular will follow but again this all appears chronic.  Cephus Shelling, MD [facility] [place] Office: [phone]  and popliteal diesase with monophasic waveforms.   DVT study RLE 08/03/2023 negative for DVT   ASSESSMENT/PLAN: This is a 67 y.o. male who presented to the hospital with with UTI sx.  In the ED, he became unresponsive and received CPR and required levophed and Epi.  He is now extubated and on CRRT.  He will need cardiac catheterization.  Vascular surgery is consulted for lower extremity wounds.    Pt has hx of right iliofemoral endarterectomy with bovine patch angioplasty and right TMA on 01/01/2022 by Dr. Karin Lieu.  On 02/08/2022, he underwent right groin washout debridement with myriad morcell and myriad matrix placement by Dr. Karin Lieu. 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    -pt with hx of the above.  His only runoff is right ATA and he does have + doppler signal present right ATA.  He will need arteriogram, however, he has other issues currently that are more pressing.  He did have arterial duplex on 9/19 that revealed 75-99% stenosis in the deep femoral artery and 50-74% stenosis in the right SFA.   -his left BKA stump has an ulceration and appears ischemic.  This will need to demarcate but may need revision to AKA depending on demarcation.   -he is currently on CRRT.  He will  need cardiac catheterization in the near future.   -Dr. Chestine Spore to evaluate pt and determine further plan -we will continue to follow.    Doreatha Massed, PA-C [facility] [phone]   I have seen and evaluated the patient. I agree with the PA note as documented above.  67 year old male admitted with sepsis as well as complete heart block.  Ultimately had a cardiac  arrest in the ED requiring CPR.  Was intubated on pressors in the ICU.  Vascular surgery has been consulted for chronic right lower extremity ischemia.  He is well-known to our practice and has had a right femoral endarterectomy with patch angioplasty and right TMA on 01/01/2022 by Dr. Sherral Hammers.  Patient is now extubated.  He has an ulcer on his right TMA that he states has been there for a long time prior to his hospitalization.  Also has an ischemic looking left BKA that was done at [facility].  He also tells me that this has been this way for a long time as well.  We will follow.  He does have a monophasic AT signal on the right.  Likely would need right leg angiogram in the future.  Will see if he has any renal recovery.  I also discussed if his left BKA worsens may need conversion to an AKA on that side.  Discussing with critical care they feel that his next step would be monitoring for renal recovery and then he needs cardiac cath and is being followed by cardiology.  Vascular will follow but again this all appears chronic.  Cephus Shelling, MD [facility] [place] Office: [phone]  and popliteal diesase with monophasic waveforms.   DVT study RLE 08/03/2023 negative for DVT   ASSESSMENT/PLAN: This is a 67 y.o. male who presented to the hospital with with UTI sx.  In the ED, he became unresponsive and received CPR and required levophed and Epi.  He is now extubated and on CRRT.  He will need cardiac catheterization.  Vascular surgery is consulted for lower extremity wounds.    Pt has hx of right iliofemoral endarterectomy with bovine patch angioplasty and right TMA on 01/01/2022 by Dr. Karin Lieu.  On 02/08/2022, he underwent right groin washout debridement with myriad morcell and myriad matrix placement by Dr. Karin Lieu. 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    -pt with hx of the above.  His only runoff is right ATA and he does have + doppler signal present right ATA.  He will need arteriogram, however, he has other issues currently that are more pressing.  He did have arterial duplex on 9/19 that revealed 75-99% stenosis in the deep femoral artery and 50-74% stenosis in the right SFA.   -his left BKA stump has an ulceration and appears ischemic.  This will need to demarcate but may need revision to AKA depending on demarcation.   -he is currently on CRRT.  He will  need cardiac catheterization in the near future.   -Dr. Chestine Spore to evaluate pt and determine further plan -we will continue to follow.    Doreatha Massed, PA-C [facility] [phone]   I have seen and evaluated the patient. I agree with the PA note as documented above.  67 year old male admitted with sepsis as well as complete heart block.  Ultimately had a cardiac  arrest in the ED requiring CPR.  Was intubated on pressors in the ICU.  Vascular surgery has been consulted for chronic right lower extremity ischemia.  He is well-known to our practice and has had a right femoral endarterectomy with patch angioplasty and right TMA on 01/01/2022 by Dr. Sherral Hammers.  Patient is now extubated.  He has an ulcer on his right TMA that he states has been there for a long time prior to his hospitalization.  Also has an ischemic looking left BKA that was done at [facility].  He also tells me that this has been this way for a long time as well.  We will follow.  He does have a monophasic AT signal on the right.  Likely would need right leg angiogram in the future.  Will see if he has any renal recovery.  I also discussed if his left BKA worsens may need conversion to an AKA on that side.  Discussing with critical care they feel that his next step would be monitoring for renal recovery and then he needs cardiac cath and is being followed by cardiology.  Vascular will follow but again this all appears chronic.  Cephus Shelling, MD [facility] [place] Office: [phone]

## 2023-08-19 NOTE — Plan of Care (Signed)
Ongoing monitoring

## 2023-08-19 NOTE — Progress Notes (Signed)
NAME:  Antonio [name]., MRN:  [id number], DOB:  [birth year], LOS: 4 ADMISSION DATE:  07/30/2023, CONSULTATION DATE:  9/19 REFERRING MD:  Dr. Criss Alvine, CHIEF COMPLAINT:  cardiac arrest   History of Present Illness:  Patient is a 67 yo M w/ pertinent PMH of HTN, CAD s/p stent placement, PAD s/p L BKA and LSFA stenting 2013, T2DM, right foot osteomyelitis s/p right iliofemoral endarterectomy and right metatarsal amputation presents to [facility] ED on 9/19 w/ UTI.  Patient having UTI symptoms for the past 4-5 days. Also having b/l back pain and abd pain. Also having chronic right leg pain that has been progressively worsening over the last 4-5 months. On arrival patient alert and hypertensive 162/120. Afebrile and wbc 7.8. UA w/ leukocytes and uc pending. Started on rocephin. Patient also w/ creat 2.32. Patient taken to vascular lab and dvt US with no evidence of dvt and arterial duplex showing 75-99% stenosis in deep femoral artery and 50-74% stenosis in superficial femoral artery recommending vascular consult.   While in ED patient found became unresponsive w/ clinched teeth. Patient was pulseless and cpr started w/ rosc in 2 minutes. Attempted to intubated after code and very difficult airway and initially could not pass ETT. Patient coded during intubation procedure. 6.5 ETT was able to be placed and ROSC was achieved. Patient on levo and epi. Patient hr 50s. EKG showing complete AV block. Cards consulted and taking for emergent cath. PCCM consulted for icu admission.  Pertinent  Medical History   Past Medical History:  Diagnosis Date   Coronary artery disease    Diabetes mellitus without complication (HCC)    typ2   GERD (gastroesophageal reflux disease)    Hyperlipidemia    Hypertension    Osteomyelitis (HCC) 03/25/2017   RT FOOT     Significant Hospital Events: Including procedures, antibiotic start and stop dates in addition to other pertinent events   9/19 admitted initially w/ UTI;  while in ED pea arrest, difficult intubation. Complete heart block going to cath lab 9/21 extubated 9/22 CRRT start  Interim History / Subjective:  Greatly improved respiratory and mental status with CRRT  Objective   Blood pressure 109/68, pulse (!) 58, temperature 98 F (36.7 C), temperature source Oral, resp. rate (!) 24, height 6' (1.829 m), weight 104.7 kg, SpO2 96%.    Vent Mode: PCV;BIPAP FiO2 (%):  [40 %] 40 % Set Rate:  [15 bmp] 15 bmp PEEP:  [5 cmH20] 5 cmH20 Pressure Support:  [10 cmH20] 10 cmH20   Intake/Output Summary (Last 24 hours) at 2023-08-26 8413 Last data filed at 08/26/2023 0900 Gross per 24 hour  Intake 1260.04 ml  Output 4381.5 ml  Net -3121.46 ml   Filed Weights   08/17/23 0500 08/18/23 0500 26-Aug-2023 0342  Weight: 106.6 kg 109.3 kg 104.7 kg    Examination: No distress on [place] Profoundly weak Breath sounds reduced bases Improved anasarca Follows commands Stable RLE vascular insufficiency changes  WBC improved Chemistries improved with CRRT CBG ok Ammonia ok  Resolved Hospital Problem list     Assessment & Plan:   Inferior MI with PEA arrest- brief, seems neuro intact; tolerated ceftriaxone challenge, this was not allergic reaction; cardiology thinks this may just be type II response to sepsis Acute respiratory failure w/ hypoxia and hypercapnia- significant emphysema noted on CT; extubated 9/22, intermittent BIPAP need likely now due to volume, improved Metabolic encephalopathy- an issue, query ICU delirium +/- uremic; improving Difficult airway- due to prior trach  and presumed subglottic stenosis CPR- induced rib fractures AKI w/ hyperkalemia- related to initial event, progressive, now w/ need for HD CAD HTNsive urgency- improved with fluid removal HLD PAD T2DM w/ hyperglycemia Question CAP, UTI- completing course of abx  - BIPAP at bedtime and PRN; precedex PRN tolerance at bedtime only - Wean off precedex now, trial off BIPAP - Stop  glargine and steroids  -Stop heparin, no plans for [facility] - VVS consult for R femoral disease, may just be OP f/u - GDMT per cardiology; LHC possibly before DC depending on renal recovery or lack thereof - Son updated 9/22 at bedside  Best Practice (right click and "Reselect all SmartList Selections" daily)   Diet/type: will see how wakes up otherwise will need cortrak DVT prophylaxis: heparin dvt ppx dose GI prophylaxis: H2B Lines: HD catheter Foley:  yes Code Status:  full code Last date of multidisciplinary goals of care discussion [updated son 9/22]  31 min cc time independent of procedures Myrla Halsted MD P[facility] 08/06/2023, 9:22 AM  Please see [email or web address] for pager details.  From 7A-7P if no response, please call [phone]. After hours, please call ELink [phone].

## 2023-08-19 NOTE — Progress Notes (Addendum)
dependent edema in RLL based on my exam. BP good off pressors. Conservative UF today.  Hyperkalemia Improved.  Acute inferior MI Query type 2 MI versus occlusive myocardial infarction, favor type 2.  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 Off pressors.  Respiratory failure Improving with volume optimization.  UTI Antibiotics.  Marrianne Mood MD 07/31/2023, 9:46 AM  [facility] Resident Progress Note  Subjective:  67 year old with hypertension, CAD with heart failure and recovered ejection fraction, PAD status post left BKA and right toe amputation, diabetes presented with dysuria.  Started treatment for UTI.  In ED suffered PEA cardiac arrest x 2 with ROSC.  Admitted to ICU for septic and cardiogenic shock due to UTI and PEA cardiac arrest.  Remained in complete heart block after ROSC.  Needed transvenous pacing to improve cardiac output.  Echo shows LVEF <20% and severely reduced RV systolic function.  Suffered AKI with creatinine of 5 and hyperkalemia, hypervolemia.  Failed trial of diuresis with furosemide.  CRRT started in ICU.  Off pressors, remains on dexmedetomidine.  Full code.  Note no history of CKD but creatinine elevated since around August 2023 at which point he was hospitalized for complications after right iliofemoral endarterectomy.  More awake and alert today.  Scheduled Meds:  arformoterol  15 mcg Nebulization BID   ascorbic acid  250 mg Oral BID   aspirin  81 mg Oral Daily   atorvastatin  40 mg Oral Daily   budesonide (PULMICORT) nebulizer solution  0.25 mg Nebulization BID   Chlorhexidine Gluconate Cloth  6 each Topical Q0600   diphenhydrAMINE  25 mg Intravenous Once   doxycycline  100 mg Oral Q12H   famotidine  20 mg Oral Daily   heparin injection (subcutaneous)  5,000 Units Subcutaneous Q8H   insulin aspart  0-9 Units Subcutaneous Q4H   insulin glargine-yfgn  10 Units Subcutaneous Daily   methylPREDNISolone (SOLU-MEDROL) injection  40 mg Intravenous Q12H   multivitamin  1 tablet Oral QHS   mupirocin ointment  1 Application Nasal BID   mouth rinse  15 mL Mouth Rinse Q2H   revefenacin  175 mcg Nebulization Daily   sodium chloride flush  3 mL Intravenous Q12H   Continuous Infusions:  sodium chloride     sodium chloride 10 mL/hr at 08/05/2023 0300   sodium chloride 10 mL/hr at 08/14/2023 0900   dexmedetomidine  (PRECEDEX) IV infusion 0.2 mcg/kg/hr (08/14/2023 0900)   feeding supplement (VITAL 1.5 CAL) Stopped (08/17/23 1059)   prismasol BGK 0/2.5 400 mL/hr at 07/31/2023 0125   prismasol BGK 0/2.5 400 mL/hr at 07/31/2023 0125   prismasol BGK 2/2.5 dialysis solution 1,500 mL/hr at 08/06/2023 0830   PRN Meds:.sodium chloride, acetaminophen, docusate, heparin, hydrALAZINE, ondansetron (ZOFRAN) IV, mouth rinse, polyethylene glycol, sodium chloride flush  Objective:  BP 109/68   Pulse (!) 58   Temp 98 F (36.7 C) (Oral)   Resp (!) 24   Ht 6' (1.829 m)   Wt 104.7 kg   SpO2 96%   BMI 31.31 kg/m   No distress HD cath [facility] Heart rate is normal, rhythm regular Breathing tachypneic but otherwise unlabored on 6 L/min, basilar crackles RLL Woody edema RLE Alert, oriented to hospital but not month or year   Intake/Output Summary (Last 24 hours) at 07/29/2023 0454 Last data filed at 08/06/2023 0800 Gross per 24 hour  Intake 1274.88 ml  Output 3952 ml  Net -2677.12 ml   270 UOP Net +1.173 L  Na 137 K 4.6 CO2 20 BUN/Cr 56/3.3 Phos 5.8  Assessment and Plan:  AKI on CKD 3B In setting of septic and cardiogenic shock, likely ATN with oliguria.  CRRT started for hyperkalemia and hypervolemia. Net -2.3 L yesterday. Will continue CRRT via RIJ TDC.  Hypervolemia Net -2.3 L yesterday. Improving respiratory status, no on 6 L/min with good SpO2. Maybe some

## 2023-08-19 NOTE — Plan of Care (Signed)
Ongoing monitoring

## 2023-08-19 NOTE — Progress Notes (Signed)
Nutrition Follow-up  DOCUMENTATION CODES:   Non-severe (moderate) malnutrition in context of chronic illness  INTERVENTION:   Add Ensure Enlive po TID, each supplement provides 350 kcal and 20 grams of protein.  Increase Renal MVI to BID  NUTRITION DIAGNOSIS:   Moderate Malnutrition related to chronic illness as evidenced by mild muscle depletion, mild fat depletion.  Continues but being addressed  GOAL:   Patient will meet greater than or equal to 90% of their needs  Not Met but being addressed  MONITOR:   PO intake, Supplement acceptance, Skin, I & O's, Weight trends  REASON FOR ASSESSMENT:   Consult, Ventilator Enteral/tube feeding initiation and management  ASSESSMENT:   67 yo male presented to ED on 9/19 with UTI symptoms; while in ED, pt with PEA arrest requiring intubation. Pt with complete heart block and cardiogenic shock. PMH includes DM, HTN, HLD, CAD, GERD, L BKA, R transmetatarsal amputation with hx of osteomyelitis and right iliofemoral endarterectomy, hx of trach/PEG tube, +smoker (tobacco use)  9/19 Admitted, Intubated 9/20 TF initiated 9/21 Extubated, TF stopped 9/22 CRRT initiated 9/23 Diet advanced to Reg  CRRT. Hyperkalemia resolved with CRRT, phosphorus improving-currently 5.8, magnesium improving as well.  Diet advanced to Regular today, no po intake yet. Plan to add oral nutrition supplements  Pt with minimal nutrition since admission Still no BM, last BM PTA   Vascular Surgery following, pt with L BKA stump that has an ulceration and appears ischemic. Per surgery needs to demarcate and then may need revision to AKA  Labs: reviewed Meds: ss novolog  Diet Order:   Diet Order             Diet NPO time specified  Diet effective midnight           Diet regular Room service appropriate? Yes with Assist; Fluid consistency: Thin  Diet effective now                   EDUCATION NEEDS:   Not appropriate for education at this  time  Skin:  Skin Assessment: Skin Integrity Issues: Skin Integrity Issues:: Other (Comment) Other: noted open wounds on RLE  Last BM:  PTA  Height:   Ht Readings from Last 1 Encounters:  08/12/2023 6' (1.829 m)    Weight:   Wt Readings from Last 1 Encounters:  08/18/2023 104.7 kg     BMI:  Body mass index is 31.31 kg/m.  Estimated Nutritional Needs:   Kcal:  1900-2200 kcals  Protein:  125-150 g  Fluid:  1L plus UOP   [name] MS, RDN, LDN, CNSC Registered Dietitian 3 Clinical Nutrition RD Pager and On-Call Pager Number Located in [place]

## 2023-08-20 DIAGNOSIS — J9601 Acute respiratory failure with hypoxia: Secondary | ICD-10-CM | POA: Diagnosis not present

## 2023-08-20 DIAGNOSIS — Z7189 Other specified counseling: Secondary | ICD-10-CM | POA: Diagnosis not present

## 2023-08-20 DIAGNOSIS — Z515 Encounter for palliative care: Secondary | ICD-10-CM | POA: Diagnosis not present

## 2023-08-20 DIAGNOSIS — I442 Atrioventricular block, complete: Secondary | ICD-10-CM | POA: Diagnosis not present

## 2023-08-20 LAB — GLUCOSE, CAPILLARY
Glucose-Capillary: 225 mg/dL — ABNORMAL HIGH (ref 70–99)
Glucose-Capillary: 251 mg/dL — ABNORMAL HIGH (ref 70–99)
Glucose-Capillary: 225 mg/dL — ABNORMAL HIGH (ref 70–99)
Glucose-Capillary: 229 mg/dL — ABNORMAL HIGH (ref 70–99)
Glucose-Capillary: 155 mg/dL — ABNORMAL HIGH (ref 70–99)
Glucose-Capillary: 107 mg/dL — ABNORMAL HIGH (ref 70–99)
Glucose-Capillary: 119 mg/dL — ABNORMAL HIGH (ref 70–99)
Glucose-Capillary: 135 mg/dL — ABNORMAL HIGH (ref 70–99)
Glucose-Capillary: 186 mg/dL — ABNORMAL HIGH (ref 70–99)

## 2023-08-20 LAB — POCT I-STAT EG7
Acid-base deficit: 4 mmol/L — ABNORMAL HIGH (ref 0.0–2.0)
Bicarbonate: 22.6 mmol/L (ref 20.0–28.0)
Calcium, Ion: 1.07 mmol/L — ABNORMAL LOW (ref 1.15–1.40)
HCT: 40 % (ref 39.0–52.0)
Hemoglobin: 13.6 g/dL (ref 13.0–17.0)
O2 Saturation: 31 %
Patient temperature: 97.8
Potassium: 4.4 mmol/L (ref 3.5–5.1)
Sodium: 134 mmol/L — ABNORMAL LOW (ref 135–145)
TCO2: 24 mmol/L (ref 22–32)
pCO2, Ven: 46.1 mmHg (ref 44–60)
pH, Ven: 7.296 (ref 7.25–7.43)
pO2, Ven: 21 mmHg — CL (ref 32–45)

## 2023-08-20 LAB — RENAL FUNCTION PANEL
Albumin: 2.1 g/dL — ABNORMAL LOW (ref 3.5–5.0)
Albumin: 2.3 g/dL — ABNORMAL LOW (ref 3.5–5.0)
Anion gap: 11 (ref 5–15)
Anion gap: 12 (ref 5–15)
BUN: 38 mg/dL — ABNORMAL HIGH (ref 8–23)
BUN: 34 mg/dL — ABNORMAL HIGH (ref 8–23)
CO2: 23 mmol/L (ref 22–32)
CO2: 21 mmol/L — ABNORMAL LOW (ref 22–32)
Calcium: 7.3 mg/dL — ABNORMAL LOW (ref 8.9–10.3)
Calcium: 7.4 mg/dL — ABNORMAL LOW (ref 8.9–10.3)
Chloride: 101 mmol/L (ref 98–111)
Chloride: 101 mmol/L (ref 98–111)
Creatinine, Ser: 2.31 mg/dL — ABNORMAL HIGH (ref 0.61–1.24)
Creatinine, Ser: 2.28 mg/dL — ABNORMAL HIGH (ref 0.61–1.24)
GFR, Estimated: 31 mL/min — ABNORMAL LOW (ref >60)
GFR, Estimated: 31 mL/min — ABNORMAL LOW (ref >60)
Glucose, Bld: 145 mg/dL — ABNORMAL HIGH (ref 70–99)
Glucose, Bld: 161 mg/dL — ABNORMAL HIGH (ref 70–99)
Phosphorus: 4.2 mg/dL (ref 2.5–4.6)
Phosphorus: 4.7 mg/dL — ABNORMAL HIGH (ref 2.5–4.6)
Potassium: 3.6 mmol/L (ref 3.5–5.1)
Potassium: 3.9 mmol/L (ref 3.5–5.1)
Sodium: 135 mmol/L (ref 135–145)
Sodium: 134 mmol/L — ABNORMAL LOW (ref 135–145)

## 2023-08-20 LAB — POTASSIUM
Potassium: 3.7 mmol/L (ref 3.5–5.1)
Potassium: 3.6 mmol/L (ref 3.5–5.1)

## 2023-08-20 LAB — CBC
HCT: 41.3 % (ref 39.0–52.0)
Hemoglobin: 12.1 g/dL — ABNORMAL LOW (ref 13.0–17.0)
MCH: 24.6 pg — ABNORMAL LOW (ref 26.0–34.0)
MCHC: 29.3 g/dL — ABNORMAL LOW (ref 30.0–36.0)
MCV: 84.1 fL (ref 80.0–100.0)
Platelets: 141 10*3/uL — ABNORMAL LOW (ref 150–400)
RBC: 4.91 MIL/uL (ref 4.22–5.81)
RDW: 15.4 % (ref 11.5–15.5)
WBC: 10.9 10*3/uL — ABNORMAL HIGH (ref 4.0–10.5)
nRBC: 0.3 % — ABNORMAL HIGH (ref 0.0–0.2)

## 2023-08-20 LAB — CULTURE, BLOOD (ROUTINE X 2)
Culture: NO GROWTH
Culture: NO GROWTH
Special Requests: ADEQUATE
Special Requests: ADEQUATE

## 2023-08-20 LAB — BLOOD GAS, VENOUS
Acid-base deficit: 3 mmol/L — ABNORMAL HIGH (ref 0.0–2.0)
Bicarbonate: 22.7 mmol/L (ref 20.0–28.0)
Drawn by: 4956
O2 Saturation: 96.1 %
Patient temperature: 36.8
pCO2, Ven: 42 mmHg — ABNORMAL LOW (ref 44–60)
pH, Ven: 7.34 (ref 7.25–7.43)
pO2, Ven: 76 mmHg — ABNORMAL HIGH (ref 32–45)

## 2023-08-20 LAB — MAGNESIUM: Magnesium: 2.6 mg/dL — ABNORMAL HIGH (ref 1.7–2.4)

## 2023-08-20 LAB — LIPOPROTEIN A (LPA): Lipoprotein (a): 51.8 nmol/L — ABNORMAL HIGH (ref <75.0)

## 2023-08-20 LAB — TRIGLYCERIDES: Triglycerides: 172 mg/dL — ABNORMAL HIGH (ref <150)

## 2023-08-20 MED ORDER — ALBUMIN HUMAN 5 % IV SOLN
12.5000 g | Freq: Once | INTRAVENOUS | Status: AC
  Administered 2023-08-20: 12.5 g via INTRAVENOUS
  Filled 2023-08-20: qty 250

## 2023-08-20 MED ORDER — GABAPENTIN 100 MG PO CAPS
200.0000 mg | ORAL_CAPSULE | Freq: Every day | ORAL | Status: DC
  Administered 2023-08-20 – 2023-08-22 (×3): 200 mg via ORAL
  Filled 2023-08-20 (×3): qty 2

## 2023-08-20 MED ORDER — OXYCODONE HCL 5 MG PO TABS: 5.0000 mg | ORAL_TABLET | Freq: Four times a day (QID) | ORAL | Status: DC | PRN

## 2023-08-20 MED ORDER — NOREPINEPHRINE 4 MG/250ML-% IV SOLN
0.0000 ug/min | INTRAVENOUS | Status: DC
  Administered 2023-08-20: 3 ug/min via INTRAVENOUS
  Filled 2023-08-20: qty 250

## 2023-08-20 MED ORDER — PRISMASOL BGK 4/2.5 32-4-2.5 MEQ/L REPLACEMENT SOLN: Status: DC

## 2023-08-20 MED ORDER — NOREPINEPHRINE 4 MG/250ML-% IV SOLN
INTRAVENOUS | Status: AC
  Administered 2023-08-20: 2 ug/min via INTRAVENOUS
  Filled 2023-08-20: qty 250

## 2023-08-20 MED ORDER — DOPAMINE-DEXTROSE 3.2-5 MG/ML-% IV SOLN
0.0000 ug/kg/min | INTRAVENOUS | Status: DC
  Filled 2023-08-20: qty 250

## 2023-08-20 MED ORDER — ATROPINE SULFATE 1 MG/10ML IJ SOSY: 0.5000 mg | PREFILLED_SYRINGE | Freq: Once | INTRAMUSCULAR | Status: DC

## 2023-08-20 MED ORDER — HYDROMORPHONE HCL 1 MG/ML IJ SOLN
1.0000 mg | INTRAMUSCULAR | Status: DC | PRN
  Administered 2023-08-20: 1 mg via INTRAVENOUS
  Filled 2023-08-20: qty 1

## 2023-08-20 MED ORDER — MIDODRINE HCL 5 MG PO TABS: 5.0000 mg | ORAL_TABLET | Freq: Three times a day (TID) | ORAL | Status: DC

## 2023-08-20 MED ORDER — OXYCODONE HCL 5 MG PO TABS
10.0000 mg | ORAL_TABLET | Freq: Four times a day (QID) | ORAL | Status: DC | PRN
  Administered 2023-08-20: 10 mg via ORAL
  Filled 2023-08-20: qty 2

## 2023-08-20 MED ORDER — METHOCARBAMOL 500 MG PO TABS
500.0000 mg | ORAL_TABLET | Freq: Three times a day (TID) | ORAL | Status: DC
  Administered 2023-08-20 – 2023-08-24 (×13): 500 mg via ORAL
  Filled 2023-08-20 (×13): qty 1

## 2023-08-20 MED ORDER — SODIUM CHLORIDE 0.9 % IV SOLN: INTRAVENOUS | Status: AC

## 2023-08-20 MED ORDER — ACETAMINOPHEN 325 MG PO TABS
650.0000 mg | ORAL_TABLET | Freq: Four times a day (QID) | ORAL | Status: DC
  Administered 2023-08-20 – 2023-08-24 (×15): 650 mg via ORAL
  Filled 2023-08-20 (×13): qty 2

## 2023-08-20 MED ORDER — ATROPINE SULFATE 1 MG/10ML IJ SOSY
PREFILLED_SYRINGE | INTRAMUSCULAR | Status: AC
  Filled 2023-08-20: qty 10

## 2023-08-20 MED ORDER — HYDROMORPHONE HCL 1 MG/ML IJ SOLN
0.5000 mg | INTRAMUSCULAR | Status: DC | PRN
  Administered 2023-08-20 – 2023-08-23 (×14): 0.5 mg via INTRAVENOUS
  Filled 2023-08-20 (×14): qty 0.5

## 2023-08-20 MED ORDER — OXYCODONE HCL 5 MG PO TABS
10.0000 mg | ORAL_TABLET | ORAL | Status: DC | PRN
  Administered 2023-08-20 – 2023-08-24 (×20): 10 mg via ORAL
  Filled 2023-08-20 (×20): qty 2

## 2023-08-20 MED ORDER — HYDROMORPHONE HCL 1 MG/ML IJ SOLN
0.5000 mg | INTRAMUSCULAR | Status: DC | PRN
  Administered 2023-08-20 (×2): 0.5 mg via INTRAVENOUS
  Filled 2023-08-20 (×2): qty 0.5

## 2023-08-20 NOTE — Progress Notes (Signed)
He has developed bradycardia, due to second degree 2:1 AV block. 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 - Clearly had second degree Mobitz 1 (3:2 Wenckebach cycles) when this started around 1305h, then settled into 2:1 AV block.     Did have transient AIVR at 65 bpm with wider QRS complex (outpacing the supraventricular rhythm), lasting just a few seconds. Suspect this is in the setting of inferior wall infarction/ischemia. Usually this does not progress to permanent complete heart block. It would be expected to respond to atropine and catecholaminergic drugs. His previous transvenous right femoral temporary pacer wire has been removed. Option to place a new temporary transvenous pacer wire from the L jugular (he has the temporary dialysis catheter on the R). Would try conservative management first. As mentioned in my earlier note, I believe his outcome will be poor even if we implement aggressive/invasive care.  Thurmon Fair, MD, [facility] [phone] office [phone] pager

## 2023-08-20 NOTE — Progress Notes (Signed)
Physical Therapy Evaluation Patient Details Name: Antonio [name]. MRN: [id number] DOB: 06/20/1956 Today's Date: 08/20/2023  History of Present Illness  67 year old male who initially presented with dysuria and chronic right leg pain found to have UTI. While waiting in the ED patient became unresponsive, septic feeling not right, went into PEA cardiac arrest, ROSC was achieved, while intubation it was difficult airway, patient coded again.  Post ROSC patient was noted to have nonsustained polymorphic VT's, underlying rhythm was noted to be complete heart block, cardiology was consulted, transcutaneous pacer was tried but failed went to Cath Lab for transvenous pacing. PMH: HTN, CAD, PAD s/p left BKA and transmet of right toes, diabetes   Clinical Impression  Pt admitted with above. Pt given IV dilaudid due to 10/10 R LE pain about 5 minutes prior to session and was very lethargic but did awake easily, followed commands, and answered questions appropriately however unable to keep eyes open for more than 10 sec at a time. Pt aware stating "sorry I'm messed up on pain meds." PTA pt was mod I from a power w/c level of function however now pt on CRRT, is very deconditioned with poor activity tolerance requiring maxAx2 for transfers to EOB. At this time recommending inpatient rehab <3 hrs of therapy a day to address above deficits prior to transitioning back home with son and daughter. Acute PT to cont to follow.        If plan is discharge home, recommend the following: Two people to help with walking and/or transfers;Two people to help with bathing/dressing/bathroom;Assist for transportation;Help with stairs or ramp for entrance   Can travel by private vehicle   No    Equipment Recommendations None recommended by PT  Recommendations for Other Services       Functional Status Assessment Patient has had a recent decline in their functional status and demonstrates the ability to make  significant improvements in function in a reasonable and predictable amount of time.     Precautions / Restrictions Precautions Precautions: Fall (CRRT) Precaution Comments: L BKA, R transmet, CRRT Restrictions Weight Bearing Restrictions: No      Mobility  Bed Mobility Overal bed mobility: Needs Assistance Bed Mobility: Supine to Sit, Sit to Supine     Supine to sit: Mod assist, +2 for physical assistance Sit to supine: Max assist, +2 for physical assistance (+RN to manage CRRT lines)   General bed mobility comments: pt initiated LE movement however required maxA for trunk elevation and to brings hips to EOB    Transfers                   General transfer comment: unable this date    Ambulation/Gait               General Gait Details: unable to at baseline  Stairs            Wheelchair Mobility     Tilt Bed    Modified Rankin (Stroke Patients Only)       Balance Overall balance assessment: Needs assistance Sitting-balance support: Feet supported, Bilateral upper extremity supported Sitting balance-Leahy Scale: Zero Sitting balance - Comments: pt dependent on posterior support, despite max verbal cues to lean forward pt continuously pushing back onto therapist sliding bottom forward on EOB. sat EOB x 5 min Postural control: Posterior lean  Pertinent Vitals/Pain Pain Assessment Pain Assessment: No/denies pain (pt given 1mg  IV dilaudid 10 min prior to PT/OT arriving and reports 10/10 but not since the pain meds)    Home Living Family/patient expects to be discharged to:: Private residence Living Arrangements: Children (son Antonio [name] and daughter) Available Help at Discharge: Family;Available PRN/intermittently (son works during the day) Type of Home: House Home Access: Level entry       Home Layout: Multi-level;Able to live on main level with bedroom/bathroom Home Equipment: Shower  seat;Wheelchair - power      Prior Function Prior Level of Function : Independent/Modified Independent             Mobility Comments: used prosthesis to transfer in/out of power chair, used power chair in home and community ADLs Comments: reports indep, able to complete simple meal prep     Extremity/Trunk Assessment   Upper Extremity Assessment Upper Extremity Assessment: Defer to OT evaluation    Lower Extremity Assessment Lower Extremity Assessment: RLE deficits/detail;LLE deficits/detail RLE Deficits / Details: pt reports " do not touch my leg" pt able to move to EOB LLE Deficits / Details: BKA, appears to have knee flexion contracture but is able to lift at hip    Cervical / Trunk Assessment Cervical / Trunk Assessment: Other exceptions (CRRT in R subclavian)  Communication   Communication Communication:  (soft spoken)  Cognition Arousal: Lethargic, Suspect due to medications (pt sleepy from recent IV dilaudid) Behavior During Therapy: [facility] for tasks assessed/performed (when able to stay awake) Overall Cognitive Status: Difficult to assess                                 General Comments: pt falling asleep quickly, most likely due to recent administration of IV dilaudid, when awake pt able to follow commands and motivated to mobilize on own        General Comments General comments (skin integrity, edema, etc.): BP soft 80/67, on CRRT, R LE discoloration    Exercises     Assessment/Plan    PT Assessment Patient needs continued PT services  PT Problem List Decreased strength;Decreased range of motion;Decreased activity tolerance;Decreased balance;Decreased mobility;Decreased coordination;Decreased cognition;Decreased knowledge of use of DME;Decreased safety awareness;Pain       PT Treatment Interventions DME instruction;Gait training;Stair training;Functional mobility training;Therapeutic activities;Therapeutic exercise;Balance training    PT  Goals (Current goals can be found in the Care Plan section)  Acute Rehab PT Goals Patient Stated Goal: didn't state PT Goal Formulation: Patient unable to participate in goal setting Time For Goal Achievement: 09/03/23 Potential to Achieve Goals: Good    Frequency Min 1X/week     Co-evaluation PT/OT/SLP Co-Evaluation/Treatment: Yes Reason for Co-Treatment: Complexity of the patient's impairments (multi-system involvement) PT goals addressed during session: Mobility/safety with mobility         AM-PAC PT "6 Clicks" Mobility  Outcome Measure Help needed turning from your back to your side while in a flat bed without using bedrails?: Total Help needed moving from lying on your back to sitting on the side of a flat bed without using bedrails?: Total Help needed moving to and from a bed to a chair (including a wheelchair)?: Total Help needed standing up from a chair using your arms (e.g., wheelchair or bedside chair)?: Total Help needed to walk in hospital room?: Total Help needed climbing 3-5 steps with a railing? : Total 6 Click Score: 6    End of  Session   Activity Tolerance: Patient limited by lethargy Patient left: in bed;with call bell/phone within reach;with nursing/sitter in room (in chair position) Nurse Communication:  (RN present to assist during session) PT Visit Diagnosis: Muscle weakness (generalized) (M62.81);Difficulty in walking, not elsewhere classified (R26.2)    Time: 7628-3151 PT Time Calculation (min) (ACUTE ONLY): 24 min   Charges:   PT Evaluation $PT Eval Moderate Complexity: 1 Mod   PT General Charges $$ ACUTE PT VISIT: 1 Visit         [name], PT, DPT Acute Rehabilitation Services Secure chat preferred Office #: [phone]   [name] 08/20/2023, 12:46 PM

## 2023-08-20 NOTE — Progress Notes (Signed)
07/31/2023 1141  SLP Visit Information  SLP Received On 08/20/2023  General Information  Date of Onset 08/07/2023  HPI 67 year old with hypertension, CAD with heart failure and recovered ejection fraction, GERD, PAD status post left BKA and right toe amputation, diabetes presented with dysuria. Admitted for  UTI while in ED suffered PEA cardiac arrest x 2 with ROSC.  Intubated 9/19-9/21. ON CRRT.  Type of Study Bedside Swallow Evaluation  Previous Swallow Assessment  (MBS 2020 with honey thick liquids after decannulation)  Diet Prior to this Study NPO  Temperature Spikes Noted No  Respiratory Status Nasal cannula  History of Recent Intubation Yes  Total duration of intubation (days) 3 days  Date extubated 08/17/23  Behavior/Cognition Alert;Cooperative;Pleasant mood  Oral Cavity Assessment WFL  Oral Care Completed by SLP No  Oral Cavity - Dentition Missing dentition (has several maxillary dentition, no dentures)  Vision Functional for self-feeding  Self-Feeding Abilities Able to feed self  Patient Positioning Upright in bed  Baseline Vocal Quality Normal  Pain Assessment  Pain Assessment Faces  Faces Pain Scale 0  Facial Expression 0  Body Movements 0  Muscle Tension 0  Compliance with ventilator (intubated pts.) N/A  Vocalization (extubated pts.) 0  CPOT Total 0  Oral Assessment (Complete on admission/transfer/every shift)  Oral Assessment  (WDL) X  Lips Symmetrical  Teeth Poor dental hygiene;Missing (Comment)  Tongue Pink;Moist  Mucous Membrane(s) Pink;Moist  Saliva Moist, saliva free flowing  Level of Consciousness Alert  Is patient on any of following O2 devices? None of the above  Nutritional status Fluids only or NPO for >24 hours  Oral Assessment Risk  Low Risk  Oral Motor/Sensory Function  Overall Oral Motor/Sensory Function WFL  Ice Chips  Ice chips NT  Thin Liquid  Thin Liquid WFL  Presentation Cup;Straw  Nectar Thick Liquid  Nectar Thick Liquid NT  Honey  Thick Liquid  Honey Thick Liquid NT  Puree  Puree WFL  Solid  Solid WFL  SLP - End of Session  Patient left in bed;with family/visitor present  Nurse Communication Diet recommendation;Swallow strategies reviewed  SLP Assessment  Clinical Impression Statement (ACUTE ONLY) Pt states mild odonophagia following 3 day intubation. Vocal quality is clear with normal intensity, no congestion or expectoration of mucous. He is missing all but 2 maxillary dentition and has majority of mandibular dentition. He does not wear dentures and oromotor is unremarkable. RN stated pt took pills with thin liquid without difficulty. Swallows via cup and straw were swift without s/s aspiration, clear vocal quality. Pt self fed 4 oz chocolate pudding without difficutly. He was able to masticate graham cracker with good oral control, transit without oral residue. Recommend pt initiate regular texture, thin liquids, pills with thin liquid. Reiterated to sit in upright position. No further ST needed.  SLP Visit Diagnosis Dysphagia, unspecified (R13.10)  Impact on safety and function Mild aspiration risk  Other Related Risk Factors History of GERD;History of dysphagia (in 2020 after trach)  Swallow Evaluation Recommendations  SLP Diet Recommendations Regular;Thin liquid  Liquid Administration via Cup;Straw  Medication Administration Whole meds with liquid  Supervision Patient able to self feed  Compensations Slow rate;Small sips/bites  Postural Changes Seated upright at 90 degrees  Treatment Plan  Oral Care Recommendations Oral care BID  Treatment Recommendations No treatment recommended at this time  Follow Up Recommendations No SLP follow up  Functional Status Assessment Patient has not had a recent decline in their functional status  Individuals Consulted  Consulted  and Agree with Results and Recommendations Patient;RN  SLP Time Calculation  SLP Start Time (ACUTE ONLY) 1147  SLP Stop Time (ACUTE ONLY) 1204   SLP Time Calculation (min) (ACUTE ONLY) 17 min  SLP Evaluations  $ SLP Speech Visit 1 Visit  SLP Evaluations  $BSS Swallow 1 Procedure

## 2023-08-20 NOTE — Progress Notes (Addendum)
[facility] Resident Progress Note  Reason for Consult: Acute renal failure Referring Physician: Lorin Glass, MD  HPI: Hospital day 5.  67 y.o. with hypertension, CAD with heart failure and recovered ejection fraction, PAD status post left BKA and right toe amputation, diabetes presented with dysuria.  Started treatment for UTI.  In ED suffered PEA cardiac arrest x 2 with ROSC.  Admitted to ICU for septic and cardiogenic shock due to UTI and PEA cardiac arrest.  Remained in complete heart block after ROSC.  Needed transvenous pacing to improve cardiac output.  Echo shows LVEF <20% and severely reduced RV systolic function.  Suffered AKI with creatinine of 5 and hyperkalemia, hypervolemia.  Failed trial of diuresis with furosemide.  CRRT started in ICU.  Off pressors, remains on dexmedetomidine.  Full code.  Note no history of CKD but creatinine elevated since around August 2023 at which point he was hospitalized for complications after right iliofemoral endarterectomy.  Softer BP overnight. SpO2 100% 4 L nasal cannula. Remains off pressors. Net -4.5 yesterday with CRRT. 10 mL UOP.  Trend in Creatinine: Creatinine, Ser  Date/Time Value Ref Range Status  08/20/2023 05:11 AM 2.31 (H) 0.61 - 1.24 mg/dL Final  82/95/6213 08:65 PM 2.65 (H) 0.61 - 1.24 mg/dL Final  78/46/9629 52:84 AM 3.30 (H) 0.61 - 1.24 mg/dL Final  13/24/4010 27:25 PM 4.89 (H) 0.61 - 1.24 mg/dL Final  36/64/4034 74:25 AM 5.87 (H) 0.61 - 1.24 mg/dL Final  95/63/8756 43:32 AM 5.62 (H) 0.61 - 1.24 mg/dL Final  95/18/8416 60:63 AM 4.79 (H) 0.61 - 1.24 mg/dL Final  01/60/1093 23:55 PM 4.49 (H) 0.61 - 1.24 mg/dL Final  73/22/0254 27:06 AM 3.07 (H) 0.61 - 1.24 mg/dL Final  23/76/2831 51:76 PM 2.83 (H) 0.61 - 1.24 mg/dL Final  16/05/3709 62:69 PM 2.20 (H) 0.61 - 1.24 mg/dL Final  48/54/6270 35:00 PM 2.32 (H) 0.61 - 1.24 mg/dL Final  93/81/8299 37:16 AM 1.89 (H) 0.61 - 1.24 mg/dL Final  96/78/9381 01:75 AM 1.13 0.61  - 1.24 mg/dL Final  09/19/8526 78:24 AM 1.25 (H) 0.61 - 1.24 mg/dL Final  23/53/6144 31:54 AM 1.50 (H) 0.61 - 1.24 mg/dL Final  00/86/7619 50:93 AM 1.73 (H) 0.61 - 1.24 mg/dL Final  26/71/2458 09:98 AM 2.01 (H) 0.61 - 1.24 mg/dL Final  33/82/5053 97:67 AM 1.82 (H) 0.61 - 1.24 mg/dL Final  34/19/3790 24:09 PM 1.54 (H) 0.61 - 1.24 mg/dL Final  73/53/2992 42:68 PM 1.26 (H) 0.61 - 1.24 mg/dL Final  34/19/6222 97:98 AM 1.34 (H) 0.61 - 1.24 mg/dL Final  92/09/9416 40:81 AM 1.26 (H) 0.61 - 1.24 mg/dL Final  44/81/8563 14:97 AM 1.84 (H) 0.61 - 1.24 mg/dL Final  02/63/7858 85:02 AM 1.54 (H) 0.61 - 1.24 mg/dL Final  77/41/2878 67:67 AM 1.02 0.61 - 1.24 mg/dL Final  20/94/7096 28:36 AM 1.12 0.61 - 1.24 mg/dL Final  62/94/7654 65:03 AM 1.05 0.61 - 1.24 mg/dL Final  54/65/6812 75:17 AM 0.94 0.61 - 1.24 mg/dL Final  00/17/4944 96:75 AM 1.04 0.61 - 1.24 mg/dL Final  91/63/8466 59:93 AM 0.99 0.61 - 1.24 mg/dL Final  57/11/7791 90:30 AM 1.14 0.61 - 1.24 mg/dL Final  08/18/3006 62:26 AM 1.22 0.61 - 1.24 mg/dL Final  33/35/4562 56:38 AM 0.90 0.61 - 1.24 mg/dL Final  93/73/4287 68:11 AM 1.17 0.61 - 1.24 mg/dL Final  57/26/2035 59:74 AM 1.10 0.61 - 1.24 mg/dL Final  16/38/4536 46:80 AM 1.04 0.61 - 1.24 mg/dL Final  32/10/2481 50:03 AM 0.80 0.61 -  1.24 mg/dL Final  16/08/9603 54:09 AM 0.84 0.61 - 1.24 mg/dL Final  81/19/1478 29:56 AM 0.59 (L) 0.61 - 1.24 mg/dL Final  21/30/8657 84:69 AM 0.53 (L) 0.61 - 1.24 mg/dL Final  62/95/2841 32:44 AM 1.11 0.61 - 1.24 mg/dL Final  11/28/7251 66:44 AM 0.42 (L) 0.61 - 1.24 mg/dL Final  03/47/4259 56:38 AM 0.47 (L) 0.61 - 1.24 mg/dL Final  75/64/3329 51:88 AM 0.58 (L) 0.61 - 1.24 mg/dL Final  41/66/0630 16:01 PM 0.67 0.61 - 1.24 mg/dL Final  09/32/3557 32:20 AM 0.71 0.61 - 1.24 mg/dL Final  25/42/7062 37:62 AM 0.73 0.61 - 1.24 mg/dL Final  83/15/1761 60:73 AM 0.55 (L) 0.61 - 1.24 mg/dL Final  71/04/2693 85:46 AM 0.43 (L) 0.61 - 1.24 mg/dL Final  27/01/5008 38:18 AM  0.54 (L) 0.61 - 1.24 mg/dL Final  29/93/7169 67:89 AM 0.57 (L) 0.61 - 1.24 mg/dL Final    PMH:   Past Medical History:  Diagnosis Date   Coronary artery disease    Diabetes mellitus without complication (HCC)    typ2   GERD (gastroesophageal reflux disease)    Hyperlipidemia    Hypertension    Osteomyelitis (HCC) 03/25/2017   RT FOOT    PSH:   Past Surgical History:  Procedure Laterality Date   AMPUTATION Right 03/27/2017   Procedure: 1st Ray Amputation Right Foot;  Surgeon: Nadara Mustard, MD;  Location: [facility] OR;  Service: Orthopedics;  Laterality: Right;   APPENDECTOMY     BELOW KNEE LEG AMPUTATION Left    CARDIAC CATHETERIZATION     CORONARY STENT INTERVENTION  2005   ENDARTERECTOMY Right 01/01/2022   Procedure: RIGHT ILEOFEMORAL ENDARTERECTOMY with PATCH ANGIOPLASTY;  Surgeon: Victorino Sparrow, MD;  Location: [facility] OR;  Service: Vascular;  Laterality: Right;   Great toe amputation right.     GROIN DEBRIDEMENT Right 02/08/2022   Procedure: GROIN DEBRIDEMENT;  Surgeon: Victorino Sparrow, MD;  Location: [facility] OR;  Service: Vascular;  Laterality: Right;   I & D EXTREMITY Left 12/30/2015   Procedure: IRRIGATION AND DEBRIDEMENT LEFT FOOT, TRANSMETATARSAL AMPUTATION WITH APPLICATION OF ANTIBIOTIC BEADS AND WOUND VAC;  Surgeon: Nadara Mustard, MD;  Location: [facility] OR;  Service: Orthopedics;  Laterality: Left;   IR GASTROSTOMY TUBE MOD SED  12/23/2018   LOWER EXTREMITY ANGIOGRAPHY Right 12/29/2021   Procedure: LOWER EXTREMITY ANGIOGRAPHY;  Surgeon: Leonie Douglas, MD;  Location: [facility] INVASIVE CV LAB;  Service: Cardiovascular;  Laterality: Right;   TEMPORARY PACEMAKER N/A 08/12/2023   Procedure: TEMPORARY PACEMAKER;  Surgeon: Corky Crafts, MD;  Location: [facility] INVASIVE CV LAB;  Service: Cardiovascular;  Laterality: N/A;   TONSILLECTOMY     TRACHEOSTOMY  11/2018   TRANSMETATARSAL AMPUTATION Right 01/01/2022   Procedure: RIGHT TRANSMETATARSAL AMPUTATION;  Surgeon: Victorino Sparrow, MD;  Location: [facility] OR;   Service: Vascular;  Laterality: Right;    Allergies:  Allergies  Allergen Reactions   Iodinated Contrast Media Hives, Rash and Other (See Comments)    Blisters Staph Blisters / staph   Penicillins Other (See Comments)    UNSPECIFIED REACTION  Note - tolerated ceftriaxone 08/16/23    Medications:   Prior to Admission medications   Medication Sig Start Date End Date Taking? Authorizing Provider  acetaminophen (TYLENOL) 325 MG tablet Take 325-650 mg by mouth as needed for moderate pain.   Yes [provider]  amLODipine (NORVASC) 10 MG tablet Take 1 tablet (10 mg total) by mouth daily. Patient not taking: Reported on 08/17/2023 01/22/22 02/21/22  1.24 mg/dL Final  16/08/9603 54:09 AM 0.84 0.61 - 1.24 mg/dL Final  81/19/1478 29:56 AM 0.59 (L) 0.61 - 1.24 mg/dL Final  21/30/8657 84:69 AM 0.53 (L) 0.61 - 1.24 mg/dL Final  62/95/2841 32:44 AM 1.11 0.61 - 1.24 mg/dL Final  11/28/7251 66:44 AM 0.42 (L) 0.61 - 1.24 mg/dL Final  03/47/4259 56:38 AM 0.47 (L) 0.61 - 1.24 mg/dL Final  75/64/3329 51:88 AM 0.58 (L) 0.61 - 1.24 mg/dL Final  41/66/0630 16:01 PM 0.67 0.61 - 1.24 mg/dL Final  09/32/3557 32:20 AM 0.71 0.61 - 1.24 mg/dL Final  25/42/7062 37:62 AM 0.73 0.61 - 1.24 mg/dL Final  83/15/1761 60:73 AM 0.55 (L) 0.61 - 1.24 mg/dL Final  71/04/2693 85:46 AM 0.43 (L) 0.61 - 1.24 mg/dL Final  27/01/5008 38:18 AM  0.54 (L) 0.61 - 1.24 mg/dL Final  29/93/7169 67:89 AM 0.57 (L) 0.61 - 1.24 mg/dL Final    PMH:   Past Medical History:  Diagnosis Date   Coronary artery disease    Diabetes mellitus without complication (HCC)    typ2   GERD (gastroesophageal reflux disease)    Hyperlipidemia    Hypertension    Osteomyelitis (HCC) 03/25/2017   RT FOOT    PSH:   Past Surgical History:  Procedure Laterality Date   AMPUTATION Right 03/27/2017   Procedure: 1st Ray Amputation Right Foot;  Surgeon: Nadara Mustard, MD;  Location: [facility] OR;  Service: Orthopedics;  Laterality: Right;   APPENDECTOMY     BELOW KNEE LEG AMPUTATION Left    CARDIAC CATHETERIZATION     CORONARY STENT INTERVENTION  2005   ENDARTERECTOMY Right 01/01/2022   Procedure: RIGHT ILEOFEMORAL ENDARTERECTOMY with PATCH ANGIOPLASTY;  Surgeon: Victorino Sparrow, MD;  Location: [facility] OR;  Service: Vascular;  Laterality: Right;   Great toe amputation right.     GROIN DEBRIDEMENT Right 02/08/2022   Procedure: GROIN DEBRIDEMENT;  Surgeon: Victorino Sparrow, MD;  Location: [facility] OR;  Service: Vascular;  Laterality: Right;   I & D EXTREMITY Left 12/30/2015   Procedure: IRRIGATION AND DEBRIDEMENT LEFT FOOT, TRANSMETATARSAL AMPUTATION WITH APPLICATION OF ANTIBIOTIC BEADS AND WOUND VAC;  Surgeon: Nadara Mustard, MD;  Location: [facility] OR;  Service: Orthopedics;  Laterality: Left;   IR GASTROSTOMY TUBE MOD SED  12/23/2018   LOWER EXTREMITY ANGIOGRAPHY Right 12/29/2021   Procedure: LOWER EXTREMITY ANGIOGRAPHY;  Surgeon: Leonie Douglas, MD;  Location: [facility] INVASIVE CV LAB;  Service: Cardiovascular;  Laterality: Right;   TEMPORARY PACEMAKER N/A 08/12/2023   Procedure: TEMPORARY PACEMAKER;  Surgeon: Corky Crafts, MD;  Location: [facility] INVASIVE CV LAB;  Service: Cardiovascular;  Laterality: N/A;   TONSILLECTOMY     TRACHEOSTOMY  11/2018   TRANSMETATARSAL AMPUTATION Right 01/01/2022   Procedure: RIGHT TRANSMETATARSAL AMPUTATION;  Surgeon: Victorino Sparrow, MD;  Location: [facility] OR;   Service: Vascular;  Laterality: Right;    Allergies:  Allergies  Allergen Reactions   Iodinated Contrast Media Hives, Rash and Other (See Comments)    Blisters Staph Blisters / staph   Penicillins Other (See Comments)    UNSPECIFIED REACTION  Note - tolerated ceftriaxone 08/16/23    Medications:   Prior to Admission medications   Medication Sig Start Date End Date Taking? Authorizing Provider  acetaminophen (TYLENOL) 325 MG tablet Take 325-650 mg by mouth as needed for moderate pain.   Yes [provider]  amLODipine (NORVASC) 10 MG tablet Take 1 tablet (10 mg total) by mouth daily. Patient not taking: Reported on 08/17/2023 01/22/22 02/21/22  1.24 mg/dL Final  16/08/9603 54:09 AM 0.84 0.61 - 1.24 mg/dL Final  81/19/1478 29:56 AM 0.59 (L) 0.61 - 1.24 mg/dL Final  21/30/8657 84:69 AM 0.53 (L) 0.61 - 1.24 mg/dL Final  62/95/2841 32:44 AM 1.11 0.61 - 1.24 mg/dL Final  11/28/7251 66:44 AM 0.42 (L) 0.61 - 1.24 mg/dL Final  03/47/4259 56:38 AM 0.47 (L) 0.61 - 1.24 mg/dL Final  75/64/3329 51:88 AM 0.58 (L) 0.61 - 1.24 mg/dL Final  41/66/0630 16:01 PM 0.67 0.61 - 1.24 mg/dL Final  09/32/3557 32:20 AM 0.71 0.61 - 1.24 mg/dL Final  25/42/7062 37:62 AM 0.73 0.61 - 1.24 mg/dL Final  83/15/1761 60:73 AM 0.55 (L) 0.61 - 1.24 mg/dL Final  71/04/2693 85:46 AM 0.43 (L) 0.61 - 1.24 mg/dL Final  27/01/5008 38:18 AM  0.54 (L) 0.61 - 1.24 mg/dL Final  29/93/7169 67:89 AM 0.57 (L) 0.61 - 1.24 mg/dL Final    PMH:   Past Medical History:  Diagnosis Date   Coronary artery disease    Diabetes mellitus without complication (HCC)    typ2   GERD (gastroesophageal reflux disease)    Hyperlipidemia    Hypertension    Osteomyelitis (HCC) 03/25/2017   RT FOOT    PSH:   Past Surgical History:  Procedure Laterality Date   AMPUTATION Right 03/27/2017   Procedure: 1st Ray Amputation Right Foot;  Surgeon: Nadara Mustard, MD;  Location: [facility] OR;  Service: Orthopedics;  Laterality: Right;   APPENDECTOMY     BELOW KNEE LEG AMPUTATION Left    CARDIAC CATHETERIZATION     CORONARY STENT INTERVENTION  2005   ENDARTERECTOMY Right 01/01/2022   Procedure: RIGHT ILEOFEMORAL ENDARTERECTOMY with PATCH ANGIOPLASTY;  Surgeon: Victorino Sparrow, MD;  Location: [facility] OR;  Service: Vascular;  Laterality: Right;   Great toe amputation right.     GROIN DEBRIDEMENT Right 02/08/2022   Procedure: GROIN DEBRIDEMENT;  Surgeon: Victorino Sparrow, MD;  Location: [facility] OR;  Service: Vascular;  Laterality: Right;   I & D EXTREMITY Left 12/30/2015   Procedure: IRRIGATION AND DEBRIDEMENT LEFT FOOT, TRANSMETATARSAL AMPUTATION WITH APPLICATION OF ANTIBIOTIC BEADS AND WOUND VAC;  Surgeon: Nadara Mustard, MD;  Location: [facility] OR;  Service: Orthopedics;  Laterality: Left;   IR GASTROSTOMY TUBE MOD SED  12/23/2018   LOWER EXTREMITY ANGIOGRAPHY Right 12/29/2021   Procedure: LOWER EXTREMITY ANGIOGRAPHY;  Surgeon: Leonie Douglas, MD;  Location: [facility] INVASIVE CV LAB;  Service: Cardiovascular;  Laterality: Right;   TEMPORARY PACEMAKER N/A 08/12/2023   Procedure: TEMPORARY PACEMAKER;  Surgeon: Corky Crafts, MD;  Location: [facility] INVASIVE CV LAB;  Service: Cardiovascular;  Laterality: N/A;   TONSILLECTOMY     TRACHEOSTOMY  11/2018   TRANSMETATARSAL AMPUTATION Right 01/01/2022   Procedure: RIGHT TRANSMETATARSAL AMPUTATION;  Surgeon: Victorino Sparrow, MD;  Location: [facility] OR;   Service: Vascular;  Laterality: Right;    Allergies:  Allergies  Allergen Reactions   Iodinated Contrast Media Hives, Rash and Other (See Comments)    Blisters Staph Blisters / staph   Penicillins Other (See Comments)    UNSPECIFIED REACTION  Note - tolerated ceftriaxone 08/16/23    Medications:   Prior to Admission medications   Medication Sig Start Date End Date Taking? Authorizing Provider  acetaminophen (TYLENOL) 325 MG tablet Take 325-650 mg by mouth as needed for moderate pain.   Yes [provider]  amLODipine (NORVASC) 10 MG tablet Take 1 tablet (10 mg total) by mouth daily. Patient not taking: Reported on 08/17/2023 01/22/22 02/21/22  1.24 mg/dL Final  16/08/9603 54:09 AM 0.84 0.61 - 1.24 mg/dL Final  81/19/1478 29:56 AM 0.59 (L) 0.61 - 1.24 mg/dL Final  21/30/8657 84:69 AM 0.53 (L) 0.61 - 1.24 mg/dL Final  62/95/2841 32:44 AM 1.11 0.61 - 1.24 mg/dL Final  11/28/7251 66:44 AM 0.42 (L) 0.61 - 1.24 mg/dL Final  03/47/4259 56:38 AM 0.47 (L) 0.61 - 1.24 mg/dL Final  75/64/3329 51:88 AM 0.58 (L) 0.61 - 1.24 mg/dL Final  41/66/0630 16:01 PM 0.67 0.61 - 1.24 mg/dL Final  09/32/3557 32:20 AM 0.71 0.61 - 1.24 mg/dL Final  25/42/7062 37:62 AM 0.73 0.61 - 1.24 mg/dL Final  83/15/1761 60:73 AM 0.55 (L) 0.61 - 1.24 mg/dL Final  71/04/2693 85:46 AM 0.43 (L) 0.61 - 1.24 mg/dL Final  27/01/5008 38:18 AM  0.54 (L) 0.61 - 1.24 mg/dL Final  29/93/7169 67:89 AM 0.57 (L) 0.61 - 1.24 mg/dL Final    PMH:   Past Medical History:  Diagnosis Date   Coronary artery disease    Diabetes mellitus without complication (HCC)    typ2   GERD (gastroesophageal reflux disease)    Hyperlipidemia    Hypertension    Osteomyelitis (HCC) 03/25/2017   RT FOOT    PSH:   Past Surgical History:  Procedure Laterality Date   AMPUTATION Right 03/27/2017   Procedure: 1st Ray Amputation Right Foot;  Surgeon: Nadara Mustard, MD;  Location: [facility] OR;  Service: Orthopedics;  Laterality: Right;   APPENDECTOMY     BELOW KNEE LEG AMPUTATION Left    CARDIAC CATHETERIZATION     CORONARY STENT INTERVENTION  2005   ENDARTERECTOMY Right 01/01/2022   Procedure: RIGHT ILEOFEMORAL ENDARTERECTOMY with PATCH ANGIOPLASTY;  Surgeon: Victorino Sparrow, MD;  Location: [facility] OR;  Service: Vascular;  Laterality: Right;   Great toe amputation right.     GROIN DEBRIDEMENT Right 02/08/2022   Procedure: GROIN DEBRIDEMENT;  Surgeon: Victorino Sparrow, MD;  Location: [facility] OR;  Service: Vascular;  Laterality: Right;   I & D EXTREMITY Left 12/30/2015   Procedure: IRRIGATION AND DEBRIDEMENT LEFT FOOT, TRANSMETATARSAL AMPUTATION WITH APPLICATION OF ANTIBIOTIC BEADS AND WOUND VAC;  Surgeon: Nadara Mustard, MD;  Location: [facility] OR;  Service: Orthopedics;  Laterality: Left;   IR GASTROSTOMY TUBE MOD SED  12/23/2018   LOWER EXTREMITY ANGIOGRAPHY Right 12/29/2021   Procedure: LOWER EXTREMITY ANGIOGRAPHY;  Surgeon: Leonie Douglas, MD;  Location: [facility] INVASIVE CV LAB;  Service: Cardiovascular;  Laterality: Right;   TEMPORARY PACEMAKER N/A 08/12/2023   Procedure: TEMPORARY PACEMAKER;  Surgeon: Corky Crafts, MD;  Location: [facility] INVASIVE CV LAB;  Service: Cardiovascular;  Laterality: N/A;   TONSILLECTOMY     TRACHEOSTOMY  11/2018   TRANSMETATARSAL AMPUTATION Right 01/01/2022   Procedure: RIGHT TRANSMETATARSAL AMPUTATION;  Surgeon: Victorino Sparrow, MD;  Location: [facility] OR;   Service: Vascular;  Laterality: Right;    Allergies:  Allergies  Allergen Reactions   Iodinated Contrast Media Hives, Rash and Other (See Comments)    Blisters Staph Blisters / staph   Penicillins Other (See Comments)    UNSPECIFIED REACTION  Note - tolerated ceftriaxone 08/16/23    Medications:   Prior to Admission medications   Medication Sig Start Date End Date Taking? Authorizing Provider  acetaminophen (TYLENOL) 325 MG tablet Take 325-650 mg by mouth as needed for moderate pain.   Yes [provider]  amLODipine (NORVASC) 10 MG tablet Take 1 tablet (10 mg total) by mouth daily. Patient not taking: Reported on 08/17/2023 01/22/22 02/21/22  1.24 mg/dL Final  16/08/9603 54:09 AM 0.84 0.61 - 1.24 mg/dL Final  81/19/1478 29:56 AM 0.59 (L) 0.61 - 1.24 mg/dL Final  21/30/8657 84:69 AM 0.53 (L) 0.61 - 1.24 mg/dL Final  62/95/2841 32:44 AM 1.11 0.61 - 1.24 mg/dL Final  11/28/7251 66:44 AM 0.42 (L) 0.61 - 1.24 mg/dL Final  03/47/4259 56:38 AM 0.47 (L) 0.61 - 1.24 mg/dL Final  75/64/3329 51:88 AM 0.58 (L) 0.61 - 1.24 mg/dL Final  41/66/0630 16:01 PM 0.67 0.61 - 1.24 mg/dL Final  09/32/3557 32:20 AM 0.71 0.61 - 1.24 mg/dL Final  25/42/7062 37:62 AM 0.73 0.61 - 1.24 mg/dL Final  83/15/1761 60:73 AM 0.55 (L) 0.61 - 1.24 mg/dL Final  71/04/2693 85:46 AM 0.43 (L) 0.61 - 1.24 mg/dL Final  27/01/5008 38:18 AM  0.54 (L) 0.61 - 1.24 mg/dL Final  29/93/7169 67:89 AM 0.57 (L) 0.61 - 1.24 mg/dL Final    PMH:   Past Medical History:  Diagnosis Date   Coronary artery disease    Diabetes mellitus without complication (HCC)    typ2   GERD (gastroesophageal reflux disease)    Hyperlipidemia    Hypertension    Osteomyelitis (HCC) 03/25/2017   RT FOOT    PSH:   Past Surgical History:  Procedure Laterality Date   AMPUTATION Right 03/27/2017   Procedure: 1st Ray Amputation Right Foot;  Surgeon: Nadara Mustard, MD;  Location: [facility] OR;  Service: Orthopedics;  Laterality: Right;   APPENDECTOMY     BELOW KNEE LEG AMPUTATION Left    CARDIAC CATHETERIZATION     CORONARY STENT INTERVENTION  2005   ENDARTERECTOMY Right 01/01/2022   Procedure: RIGHT ILEOFEMORAL ENDARTERECTOMY with PATCH ANGIOPLASTY;  Surgeon: Victorino Sparrow, MD;  Location: [facility] OR;  Service: Vascular;  Laterality: Right;   Great toe amputation right.     GROIN DEBRIDEMENT Right 02/08/2022   Procedure: GROIN DEBRIDEMENT;  Surgeon: Victorino Sparrow, MD;  Location: [facility] OR;  Service: Vascular;  Laterality: Right;   I & D EXTREMITY Left 12/30/2015   Procedure: IRRIGATION AND DEBRIDEMENT LEFT FOOT, TRANSMETATARSAL AMPUTATION WITH APPLICATION OF ANTIBIOTIC BEADS AND WOUND VAC;  Surgeon: Nadara Mustard, MD;  Location: [facility] OR;  Service: Orthopedics;  Laterality: Left;   IR GASTROSTOMY TUBE MOD SED  12/23/2018   LOWER EXTREMITY ANGIOGRAPHY Right 12/29/2021   Procedure: LOWER EXTREMITY ANGIOGRAPHY;  Surgeon: Leonie Douglas, MD;  Location: [facility] INVASIVE CV LAB;  Service: Cardiovascular;  Laterality: Right;   TEMPORARY PACEMAKER N/A 08/12/2023   Procedure: TEMPORARY PACEMAKER;  Surgeon: Corky Crafts, MD;  Location: [facility] INVASIVE CV LAB;  Service: Cardiovascular;  Laterality: N/A;   TONSILLECTOMY     TRACHEOSTOMY  11/2018   TRANSMETATARSAL AMPUTATION Right 01/01/2022   Procedure: RIGHT TRANSMETATARSAL AMPUTATION;  Surgeon: Victorino Sparrow, MD;  Location: [facility] OR;   Service: Vascular;  Laterality: Right;    Allergies:  Allergies  Allergen Reactions   Iodinated Contrast Media Hives, Rash and Other (See Comments)    Blisters Staph Blisters / staph   Penicillins Other (See Comments)    UNSPECIFIED REACTION  Note - tolerated ceftriaxone 08/16/23    Medications:   Prior to Admission medications   Medication Sig Start Date End Date Taking? Authorizing Provider  acetaminophen (TYLENOL) 325 MG tablet Take 325-650 mg by mouth as needed for moderate pain.   Yes [provider]  amLODipine (NORVASC) 10 MG tablet Take 1 tablet (10 mg total) by mouth daily. Patient not taking: Reported on 08/17/2023 01/22/22 02/21/22

## 2023-08-20 NOTE — Evaluation (Signed)
Occupational Therapy Evaluation Patient Details Name: Antonio [name]. MRN: [id number] DOB: 05-04-1956 Today's Date: 08/20/2023   History of Present Illness 67 year old male who initially presented with dysuria and chronic right leg pain found to have UTI. While waiting in the ED patient became unresponsive, septic feeling not right, went into PEA cardiac arrest, ROSC was achieved, while intubation it was difficult airway, patient coded again.  Post ROSC patient was noted to have nonsustained polymorphic VT's, underlying rhythm was noted to be complete heart block, cardiology was consulted, transcutaneous pacer was tried but failed went to Cath Lab for transvenous pacing. PMH: HTN, CAD, PAD s/p left BKA and transmet of right toes, diabetes   Clinical Impression   Pt reports ind at baseline with ADLs, uses prosthesis and power chair for mobility, was living with his son and daughter PTA. Pt currently needing min-max A for ADLs and mod-max A +2 for bed mobility. Pt with posterior lean sitting EOB with hips subsequently coming forward, max A posterior support to sit EOB. Pt fearful of falling unable to reach outside BOS for wash cloth to perform grooming task seated EOB. Pt presenting with impairments listed below, will follow acutely. Patient will benefit from continued inpatient follow up therapy, <3 hours/day to maximize safety/ind with ADLs/functional mobility.        If plan is discharge home, recommend the following: Two people to help with walking and/or transfers;A lot of help with bathing/dressing/bathroom;Assistance with cooking/housework;Direct supervision/assist for medications management;Direct supervision/assist for financial management;Help with stairs or ramp for entrance;Supervision due to cognitive status;Assist for transportation    Functional Status Assessment  Patient has had a recent decline in their functional status and demonstrates the ability to make significant  improvements in function in a reasonable and predictable amount of time.  Equipment Recommendations  Other (comment) (defer)    Recommendations for Other Services PT consult     Precautions / Restrictions Precautions Precautions: Fall Precaution Comments: L BKA, R transmet, CRRT Restrictions Weight Bearing Restrictions: No      Mobility Bed Mobility Overal bed mobility: Needs Assistance Bed Mobility: Supine to Sit, Sit to Supine     Supine to sit: Mod assist, +2 for physical assistance Sit to supine: Max assist, +2 for physical assistance   General bed mobility comments: pt initiated LE movement however required maxA for trunk elevation and to brings hips to EOB    Transfers                   General transfer comment: unable this date      Balance Overall balance assessment: Needs assistance Sitting-balance support: Feet supported, Bilateral upper extremity supported Sitting balance-Leahy Scale: Zero Sitting balance - Comments: pt dependent on posterior support, despite max verbal cues to lean forward pt continuously pushing back onto therapist sliding bottom forward on EOB. sat EOB x 5 min Postural control: Posterior lean                                 ADL either performed or assessed with clinical judgement   ADL Overall ADL's : Needs assistance/impaired Eating/Feeding: Minimal assistance   Grooming: Minimal assistance   Upper Body Bathing: Moderate assistance   Lower Body Bathing: Maximal assistance   Upper Body Dressing : Moderate assistance   Lower Body Dressing: Maximal assistance   Toilet Transfer: Moderate assistance   Toileting- Clothing Manipulation and Hygiene: Maximal assistance  Functional mobility during ADLs: Maximal assistance;+2 for physical assistance       Vision   Additional Comments: will further assess, keeps eyes closed frequently     Perception Perception: Not tested       Praxis Praxis: Not  tested       Pertinent Vitals/Pain Pain Assessment Pain Assessment: No/denies pain     Extremity/Trunk Assessment Upper Extremity Assessment Upper Extremity Assessment: Generalized weakness (within limits of CRRT on R subclavian)   Lower Extremity Assessment Lower Extremity Assessment: Defer to PT evaluation   Cervical / Trunk Assessment Cervical / Trunk Assessment: Normal   Communication Communication Communication: Other (comment) (soft spoken)   Cognition Arousal: Lethargic, Suspect due to medications Behavior During Therapy: WFL for tasks assessed/performed Overall Cognitive Status: Difficult to assess                                 General Comments: pt falling asleep quickly, most likely due to recent administration of IV dilaudid, when awake pt able to follow commands and motivated to mobilize on own     General Comments  BP soft 80/67    Exercises     Shoulder Instructions      Home Living Family/patient expects to be discharged to:: Private residence Living Arrangements: Children Available Help at Discharge: Family;Available PRN/intermittently Type of Home: House Home Access: Level entry     Home Layout: Multi-level;Able to live on main level with bedroom/bathroom     Bathroom Shower/Tub: Producer, television/film/video: Handicapped height Bathroom Accessibility: Yes   Home Equipment: Shower seat;Wheelchair - power          Prior Functioning/Environment Prior Level of Function : Independent/Modified Independent             Mobility Comments: used prosthesis to transfer in/out of power chair, used power chair in home and community ADLs Comments: reports indep, able to complete simple meal prep        OT Problem List: Decreased strength;Decreased activity tolerance;Decreased coordination;Decreased cognition;Cardiopulmonary status limiting activity;Decreased range of motion;Impaired balance (sitting and/or standing);Decreased  safety awareness      OT Treatment/Interventions: Self-care/ADL training;Therapeutic exercise;Energy conservation;DME and/or AE instruction;Therapeutic activities;Patient/family education;Balance training    OT Goals(Current goals can be found in the care plan section) Acute Rehab OT Goals Patient Stated Goal: none stated OT Goal Formulation: With patient Time For Goal Achievement: 09/03/23 Potential to Achieve Goals: Good ADL Goals Pt Will Perform Upper Body Dressing: sitting;with contact guard assist;with min assist Pt Will Perform Lower Body Dressing: with min assist;sit to/from stand;sitting/lateral leans Pt Will Transfer to Toilet: with min assist;squat pivot transfer;stand pivot transfer;bedside commode  OT Frequency: Min 1X/week    Co-evaluation PT/OT/SLP Co-Evaluation/Treatment: Yes Reason for Co-Treatment: Complexity of the patient's impairments (multi-system involvement)   OT goals addressed during session: ADL's and self-care      AM-PAC OT "6 Clicks" Daily Activity     Outcome Measure Help from another person eating meals?: A Little Help from another person taking care of personal grooming?: A Little Help from another person toileting, which includes using toliet, bedpan, or urinal?: Total Help from another person bathing (including washing, rinsing, drying)?: A Lot Help from another person to put on and taking off regular upper body clothing?: A Lot Help from another person to put on and taking off regular lower body clothing?: A Lot 6 Click Score: 13   End of Session Equipment  Utilized During Treatment: Oxygen Nurse Communication: Mobility status  Activity Tolerance: Patient tolerated treatment well Patient left: in bed;with call bell/phone within reach;with nursing/sitter in room  OT Visit Diagnosis: Unsteadiness on feet (R26.81);Other abnormalities of gait and mobility (R26.89);Muscle weakness (generalized) (M62.81)                Time: 6962-9528 OT Time  Calculation (min): 24 min Charges:  OT General Charges $OT Visit: 1 Visit OT Evaluation $OT Eval Moderate Complexity: 1 Mod  Samuele Storey K, OTD, OTR/L SecureChat Preferred Acute Rehab [phone]   Carver Fila Koonce 08/20/2023, 4:36 PM

## 2023-08-20 NOTE — Progress Notes (Signed)
Progress Note    08/20/2023 8:01 AM 5 Days Post-Op  Subjective:  still having pain in bilateral lower legs    Vitals:   08/20/23 0700 08/20/23 0727  BP: (!) 84/58   Pulse: 64   Resp: (!) 22   Temp:    SpO2: 99% 100%    Physical Exam: General:  sitting in bed, no acute distress Cardiac:  regular Lungs:  nonlabored, on breathing treatment Extremities:  right TMA wounds unchanged, soft AT doppler signal. Left BKA ischemic appearance unchanged  CBC    Component Value Date/Time   WBC 10.9 (H) 08/20/2023 0511   RBC 4.91 08/20/2023 0511   HGB 12.1 (L) 08/20/2023 0511   HCT 41.3 08/20/2023 0511   PLT 141 (L) 08/20/2023 0511   MCV 84.1 08/20/2023 0511   MCH 24.6 (L) 08/20/2023 0511   MCHC 29.3 (L) 08/20/2023 0511   RDW 15.4 08/20/2023 0511   LYMPHSABS 1.2 08/17/2023 1316   MONOABS 0.6 08/20/2023 1316   EOSABS 0.2 08/26/2023 1316   BASOSABS 0.0 08/23/2023 1316    BMET    Component Value Date/Time   NA 135 08/20/2023 0511   K 3.6 08/20/2023 0511   CL 101 08/20/2023 0511   CO2 23 08/20/2023 0511   GLUCOSE 145 (H) 08/20/2023 0511   BUN 38 (H) 08/20/2023 0511   CREATININE 2.31 (H) 08/20/2023 0511   CALCIUM 7.3 (L) 08/20/2023 0511   GFRNONAA 31 (L) 08/20/2023 0511   GFRAA >60 02/15/2019 0455    INR    Component Value Date/Time   INR 1.0 02/03/2022 1947     Intake/Output Summary (Last 24 hours) at 08/20/2023 0801 Last data filed at 08/20/2023 0700 Gross per 24 hour  Intake 788.77 ml  Output 5196.4 ml  Net -4407.63 ml      Assessment/Plan:  67 y.o. male with right TMA and left BKA wounds  -No acute events overnight. Still dealing with fairly constant pain in his legs, left > right -RLE with faint AT doppler signal. Wounds unchanged -Left BKA with continued ischemic appearance. May require conversion to AKA sooner than later given his current pain -Revascularization of RLE pending renal recovery, this morning labs pending   Loel Dubonnet,  PA-C [facility] [phone] 08/20/2023 8:01 AM

## 2023-08-20 NOTE — Consult Note (Signed)
Consultation Note Date: 08/20/2023   Patient Name: Antonio [name] [facility].  DOB: 10/26/1956  MRN: [id number]  Age / Sex: 67 y.o., male  PCP: Erskine Emery, NP Referring Physician: Lorin Glass, MD  Reason for Consultation: Establishing goals of care  HPI/Patient Profile:   67 y.o. male  admitted on 08/07/2023 with past medical history significant for hypertension, coronary artery disease CHF, PAD status post left BKA and right metatarsal amputation. According to family patient has been seen by her primary care physician in over a year.  In ED suffered PEA cardiac arrest x 2 with ROSC. Admitted to ICU for septic and cardiogenic shock due to UTI and PEA cardiac arrest. Remained in complete heart block after ROSC. Needed transvenous pacing to improve cardiac output. Echo shows LVEF <20% and severely reduced RV systolic function. Suffered AKI with creatinine of 5 and hyperkalemia, hypervolemia. Failed trial of diuresis with furosemide. CRRT started in ICU.   Required initiation of pressors again today to maintain BP.    Patient and family face treatment option decisions, advanced directive decisions and anticipatory care needs.  Clinical Assessment and Goals of Care:  This NP Lorinda Creed reviewed medical records, received report from team, assessed the patient and then meet at the patient's bedside along with his son [name] to discuss diagnosis, prognosis, GOC, EOL wishes disposition and options.   Concept of Palliative Care was introduced as specialized medical care for people and their families living with serious illness.  If focuses on providing relief from the symptoms and stress of a serious illness.  The goal is to improve quality of life for both the patient and the family. Values and goals of care important to patient and family were attempted to be elicited.        I later  spoke by telephone  with son [name] reviewing the same above concepts.   Daughter/ [name] will be arriving from out of town today  Education offered on the seriousness of patient's current medical situation.  Education offered on his multiple comorbidities specific to; heart failure, coronary artery disease, worsening renal function/currently on CRRT at bedside/creatinine 2.31 today.  We discussed his need for her vasopressors to support his blood pressure today.  Family had questions regarding long-term hemodialysis.  Education offered.    [name] had questions regarding future care needs.  Education offered on patient's fragile state and my worry that anything could happen at any time.  Encouraged family to continue conversation regarding goals of care with patient, with  each other and the medical providers,  and take it 1 day at a time.   A  discussion was had today regarding advanced directives.  Concepts specific to code status, artifical feeding and hydration, continued IV antibiotics and rehospitalization was had.    The difference between a aggressive medical intervention path  and a palliative comfort care path for this patient at this time was had.     MOST form reviewed and left for family to discuss  Consultation Note Date: 08/20/2023   Patient Name: Antonio [name] [facility].  DOB: 10/26/1956  MRN: [id number]  Age / Sex: 67 y.o., male  PCP: Erskine Emery, NP Referring Physician: Lorin Glass, MD  Reason for Consultation: Establishing goals of care  HPI/Patient Profile:   67 y.o. male  admitted on 08/08/2023 with past medical history significant for hypertension, coronary artery disease CHF, PAD status post left BKA and right metatarsal amputation. According to family patient has been seen by her primary care physician in over a year.  In ED suffered PEA cardiac arrest x 2 with ROSC. Admitted to ICU for septic and cardiogenic shock due to UTI and PEA cardiac arrest. Remained in complete heart block after ROSC. Needed transvenous pacing to improve cardiac output. Echo shows LVEF <20% and severely reduced RV systolic function. Suffered AKI with creatinine of 5 and hyperkalemia, hypervolemia. Failed trial of diuresis with furosemide. CRRT started in ICU.   Required initiation of pressors again today to maintain BP.    Patient and family face treatment option decisions, advanced directive decisions and anticipatory care needs.  Clinical Assessment and Goals of Care:  This NP Lorinda Creed reviewed medical records, received report from team, assessed the patient and then meet at the patient's bedside along with his son [name] to discuss diagnosis, prognosis, GOC, EOL wishes disposition and options.   Concept of Palliative Care was introduced as specialized medical care for people and their families living with serious illness.  If focuses on providing relief from the symptoms and stress of a serious illness.  The goal is to improve quality of life for both the patient and the family. Values and goals of care important to patient and family were attempted to be elicited.        I later  spoke by telephone  with son [name] reviewing the same above concepts.   Daughter/ [name] will be arriving from out of town today  Education offered on the seriousness of patient's current medical situation.  Education offered on his multiple comorbidities specific to; heart failure, coronary artery disease, worsening renal function/currently on CRRT at bedside/creatinine 2.31 today.  We discussed his need for her vasopressors to support his blood pressure today.  Family had questions regarding long-term hemodialysis.  Education offered.    [name] had questions regarding future care needs.  Education offered on patient's fragile state and my worry that anything could happen at any time.  Encouraged family to continue conversation regarding goals of care with patient, with  each other and the medical providers,  and take it 1 day at a time.   A  discussion was had today regarding advanced directives.  Concepts specific to code status, artifical feeding and hydration, continued IV antibiotics and rehospitalization was had.    The difference between a aggressive medical intervention path  and a palliative comfort care path for this patient at this time was had.     MOST form reviewed and left for family to discuss  Consultation Note Date: 08/20/2023   Patient Name: Antonio [name] [facility].  DOB: 10/26/1956  MRN: [id number]  Age / Sex: 67 y.o., male  PCP: Erskine Emery, NP Referring Physician: Lorin Glass, MD  Reason for Consultation: Establishing goals of care  HPI/Patient Profile:   67 y.o. male  admitted on 08/08/2023 with past medical history significant for hypertension, coronary artery disease CHF, PAD status post left BKA and right metatarsal amputation. According to family patient has been seen by her primary care physician in over a year.  In ED suffered PEA cardiac arrest x 2 with ROSC. Admitted to ICU for septic and cardiogenic shock due to UTI and PEA cardiac arrest. Remained in complete heart block after ROSC. Needed transvenous pacing to improve cardiac output. Echo shows LVEF <20% and severely reduced RV systolic function. Suffered AKI with creatinine of 5 and hyperkalemia, hypervolemia. Failed trial of diuresis with furosemide. CRRT started in ICU.   Required initiation of pressors again today to maintain BP.    Patient and family face treatment option decisions, advanced directive decisions and anticipatory care needs.  Clinical Assessment and Goals of Care:  This NP Lorinda Creed reviewed medical records, received report from team, assessed the patient and then meet at the patient's bedside along with his son [name] to discuss diagnosis, prognosis, GOC, EOL wishes disposition and options.   Concept of Palliative Care was introduced as specialized medical care for people and their families living with serious illness.  If focuses on providing relief from the symptoms and stress of a serious illness.  The goal is to improve quality of life for both the patient and the family. Values and goals of care important to patient and family were attempted to be elicited.        I later  spoke by telephone  with son [name] reviewing the same above concepts.   Daughter/ [name] will be arriving from out of town today  Education offered on the seriousness of patient's current medical situation.  Education offered on his multiple comorbidities specific to; heart failure, coronary artery disease, worsening renal function/currently on CRRT at bedside/creatinine 2.31 today.  We discussed his need for her vasopressors to support his blood pressure today.  Family had questions regarding long-term hemodialysis.  Education offered.    [name] had questions regarding future care needs.  Education offered on patient's fragile state and my worry that anything could happen at any time.  Encouraged family to continue conversation regarding goals of care with patient, with  each other and the medical providers,  and take it 1 day at a time.   A  discussion was had today regarding advanced directives.  Concepts specific to code status, artifical feeding and hydration, continued IV antibiotics and rehospitalization was had.    The difference between a aggressive medical intervention path  and a palliative comfort care path for this patient at this time was had.     MOST form reviewed and left for family to discuss  Consultation Note Date: 08/20/2023   Patient Name: Antonio [name] [facility].  DOB: 10/26/1956  MRN: [id number]  Age / Sex: 67 y.o., male  PCP: Erskine Emery, NP Referring Physician: Lorin Glass, MD  Reason for Consultation: Establishing goals of care  HPI/Patient Profile:   67 y.o. male  admitted on 08/08/2023 with past medical history significant for hypertension, coronary artery disease CHF, PAD status post left BKA and right metatarsal amputation. According to family patient has been seen by her primary care physician in over a year.  In ED suffered PEA cardiac arrest x 2 with ROSC. Admitted to ICU for septic and cardiogenic shock due to UTI and PEA cardiac arrest. Remained in complete heart block after ROSC. Needed transvenous pacing to improve cardiac output. Echo shows LVEF <20% and severely reduced RV systolic function. Suffered AKI with creatinine of 5 and hyperkalemia, hypervolemia. Failed trial of diuresis with furosemide. CRRT started in ICU.   Required initiation of pressors again today to maintain BP.    Patient and family face treatment option decisions, advanced directive decisions and anticipatory care needs.  Clinical Assessment and Goals of Care:  This NP Lorinda Creed reviewed medical records, received report from team, assessed the patient and then meet at the patient's bedside along with his son [name] to discuss diagnosis, prognosis, GOC, EOL wishes disposition and options.   Concept of Palliative Care was introduced as specialized medical care for people and their families living with serious illness.  If focuses on providing relief from the symptoms and stress of a serious illness.  The goal is to improve quality of life for both the patient and the family. Values and goals of care important to patient and family were attempted to be elicited.        I later  spoke by telephone  with son [name] reviewing the same above concepts.   Daughter/ [name] will be arriving from out of town today  Education offered on the seriousness of patient's current medical situation.  Education offered on his multiple comorbidities specific to; heart failure, coronary artery disease, worsening renal function/currently on CRRT at bedside/creatinine 2.31 today.  We discussed his need for her vasopressors to support his blood pressure today.  Family had questions regarding long-term hemodialysis.  Education offered.    [name] had questions regarding future care needs.  Education offered on patient's fragile state and my worry that anything could happen at any time.  Encouraged family to continue conversation regarding goals of care with patient, with  each other and the medical providers,  and take it 1 day at a time.   A  discussion was had today regarding advanced directives.  Concepts specific to code status, artifical feeding and hydration, continued IV antibiotics and rehospitalization was had.    The difference between a aggressive medical intervention path  and a palliative comfort care path for this patient at this time was had.     MOST form reviewed and left for family to discuss

## 2023-08-20 NOTE — Progress Notes (Signed)
NAME:  Antonio Burts., MRN:  [id number], DOB:  November 23, 1956, LOS: 5 ADMISSION DATE:  08/04/2023, CONSULTATION DATE:  9/19 REFERRING MD:  Dr. Criss Alvine, CHIEF COMPLAINT:  cardiac arrest   History of Present Illness:  Patient is a 67 yo M w/ pertinent PMH of HTN, CAD s/p stent placement, PAD s/p L BKA and LSFA stenting 2013, T2DM, right foot osteomyelitis s/p right iliofemoral endarterectomy and right metatarsal amputation presents to [facility] ED on 9/19 w/ UTI.  Patient having UTI symptoms for the past 4-5 days. Also having b/l back pain and abd pain. Also having chronic right leg pain that has been progressively worsening over the last 4-5 months. On arrival patient alert and hypertensive 162/120. Afebrile and wbc 7.8. UA w/ leukocytes and uc pending. Started on rocephin. Patient also w/ creat 2.32. Patient taken to vascular lab and dvt US with no evidence of dvt and arterial duplex showing 75-99% stenosis in deep femoral artery and 50-74% stenosis in superficial femoral artery recommending vascular consult.   While in ED patient found became unresponsive w/ clinched teeth. Patient was pulseless and cpr started w/ rosc in 2 minutes. Attempted to intubated after code and very difficult airway and initially could not pass ETT. Patient coded during intubation procedure. 6.5 ETT was able to be placed and ROSC was achieved. Patient on levo and epi. Patient hr 50s. EKG showing complete AV block. Cards consulted and taking for emergent cath. PCCM consulted for icu admission.  Pertinent  Medical History   Past Medical History:  Diagnosis Date   Coronary artery disease    Diabetes mellitus without complication (HCC)    typ2   GERD (gastroesophageal reflux disease)    Hyperlipidemia    Hypertension    Osteomyelitis (HCC) 03/25/2017   RT FOOT    Significant Hospital Events: Including procedures, antibiotic start and stop dates in addition to other pertinent events   9/19 admitted initially w/ UTI;  while in ED pea arrest, difficult intubation. Complete heart block going to cath lab 9/21 extubated 9/22 CRRT start  Interim History / Subjective:  Back on pressors but more awake. Willing to try some food. Having lots of pain at his left stump site.  Objective   Blood pressure (!) 85/56, pulse 69, temperature (P) 97.9 F (36.6 C), resp. rate 13, height 6' (1.829 m), weight 101.5 kg, SpO2 96%.    FiO2 (%):  [50 %] 50 %   Intake/Output Summary (Last 24 hours) at 08/20/2023 1332 Last data filed at 08/20/2023 1200 Gross per 24 hour  Intake 1339.45 ml  Output 4123.7 ml  Net -2784.25 ml   Filed Weights   08/18/23 0500 08/03/2023 0342 08/20/23 0600  Weight: 109.3 kg 104.7 kg 101.5 kg    Examination: Chronically ill no distress on CRRT Lungs with improved crackles Heart sounds regular, ext warm Abd soft, +BS Moves to command See Dr. Ophelia Charter note yesterday regarding leg wounds and pulses  BMP stable Improved plts   Resolved Hospital Problem list     Assessment & Plan:   Inferior MI with PEA arrest- brief, seems neuro intact; tolerated ceftriaxone challenge, this was not allergic reaction; cardiology thinks this may just be type II response to sepsis Acute respiratory failure w/ hypoxia and hypercapnia- significant emphysema noted on CT; extubated 9/22, intermittent BIPAP need likely now due to volume, improved Metabolic encephalopathy- an issue, query ICU delirium +/- uremic; improving Difficult airway- due to prior trach and 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 Ophelia Charter evaluation 08/06/2023 T2DM Question CAP, UTI- completing course of abx  - Strengthen pain regimen - CRRT maybe stop tomorrow and see if any renal recovery - Levophed for MAP 65 - Pending renal recovery or lack thereof will need LHC then  consideration for additional intervention on LE - Son updated 9/24 at bedside  Best Practice (right click and "Reselect all SmartList Selections" daily)   Diet/type: heart healthy DVT prophylaxis: heparin dvt ppx dose GI prophylaxis: H2B Lines: HD catheter Foley:  yes Code Status:  full code Last date of multidisciplinary goals of care discussion [updated son 9/24]  33 min cc time independent of procedures Myrla Halsted MD PCCM [facility] 08/20/2023, 1:32 PM  Please see [email or web address] for pager details.  From 7A-7P if no response, please call [phone]. After hours, please call ELink [phone].

## 2023-08-20 NOTE — Progress Notes (Signed)
08/20/2023 Went back into high degree AV block with associated hypotension. Spoke with Dr. Royann Shivers. Start dopamine Stop precedex, if persistent may need another wire. Appreciate palliative care input: wants to try all treatments for now  Myrla Halsted MD PCCM

## 2023-08-20 NOTE — Progress Notes (Signed)
Speech Language Pathology Treatment:    Patient Details Name: Antonio [name]. MRN: [id number] DOB: September 02, 1956 Today's Date: 08/20/2023 Time:  -     Pt seen yesterday for bedside swallow and he showed no signs of aspiration with thin, applesauce or puree and masticated regular texture without difficulty. This morning RN stated he coughed when taking pills with thin liquids and put in another ST consult. ST spoke with RN who stated she then gave meds whole in applesauce and he did much better, no s/s aspiration. Recommend that he does not need another bedside swallow assessment but to continue pills whole in puree (if large can crush). RN in agreement and will notify ST if he has further difficulty.     Royce Macadamia  08/20/2023, 10:16 AM

## 2023-08-20 NOTE — Progress Notes (Signed)
   08/20/23 2311  BiPAP/CPAP/SIPAP  Reason BIPAP/CPAP not in use Non-compliant (refused)  BiPAP/CPAP /SiPAP Vitals  Pulse Rate 83  Resp 11  SpO2 100 %  MEWS Score/Color  MEWS Score 1  MEWS Score Color [place] Si

## 2023-08-20 NOTE — Progress Notes (Signed)
Patient Name: Antonio [name] [facility]. Date of Encounter: 08/20/2023 [facility] Cardiologist: Rollene Rotunda, MD   Interval Summary  .    Hypotensive this morning. Bilateral lower extremity pain is biggest complaint. No angina or dyspnea. Net -4.6 L w CRRT, -3 L since admission. Anuric.  Vital Signs .    Vitals:   08/20/23 0500 08/20/23 0600 08/20/23 0700 08/20/23 0727  BP: (!) 86/52 (!) 81/56 (!) 84/58   Pulse: 68 65 64   Resp: (!) 22 20 (!) 22   Temp:      TempSrc:      SpO2: 97% 97% 99% 100%  Weight:  101.5 kg    Height:        Intake/Output Summary (Last 24 hours) at 08/20/2023 0814 Last data filed at 08/20/2023 0700 Gross per 24 hour  Intake 788.77 ml  Output 5196.4 ml  Net -4407.63 ml      08/20/2023    6:00 AM 08/10/2023    3:42 AM 08/18/2023    5:00 AM  Last 3 Weights  Weight (lbs) 223 lb 12.3 oz 230 lb 13.2 oz 240 lb 15.4 oz  Weight (kg) 101.5 kg 104.7 kg 109.3 kg      Telemetry/ECG    NSR, PVCs - Personally Reviewed  Physical Exam .   GEN: No acute distress.   Neck: No JVD Cardiac: RRR, no murmurs, rubs, or gallops.  Respiratory: Clear to auscultation bilaterally. GI: Soft, nontender, non-distended  MS: L BKA stump is cold and cyanotic, small non-inflamed ulceration. T transmetatarsal amputation stump with moderate size crusted non-inflamed ulcerations.  Assessment & Plan .     67 year old man with longstanding insulin requiring diabetes and severe PAD including previous limb amputation (left AKA, right transmetatarsal amputation), presenting with dysuria and bilateral back, quickly developed shock and cardiac arrest in the emergency room (bradycardia versus PEA), subsequently requiring intubation and pressor support, with protracted period of complete heart block and recurrent episodes of nonsustained VT, requiring temporary transvenous pacing.  Subsequently found to have severe elevation in high-sensitivity troponin c/w NSTEMI and severe  biventricular dysfunction with left ventricular ejection fraction less than 20%. Now with oligoanuric ARF and bilateral leg pain, evidence of severe ischemia of the left BKA stump.  Shock: he is again hypotensive and I worry that he is now hypovolemic. Try to avoid pressors since this would further worsen kidney and limb ischemia. Will give back some volume. Might benefit from invasive hemodynamic monitoring, but I am not sure that escalating invasive care is the right approach for him. CHF: no evidence of pulmonary edema, I believe he will tolerate some volume. EF <20% by report (25-30%on my review of echo). BP does not allow HF therapies. CAD s/p NSTEMI: on my review of echo there is regional variation in contractility. I think he has completed an inferior/inferolateral infarction, but there is viable anterior/anteroseptal myocardium. Currently he is not a candidate for invasive coronary evaluation. He is not experiencing angina.  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 PAD: When his BP is low his legs become even more ischemic and he has severe pain. The L BKA stump is very cold and cyanotic. WBC is steadily improving. Check CK.  Overall prognosis is poor, especially if we do not see signs of renal recovery soon.  CRITICAL CARE Performed by: Rachelle Hora Brennyn Haisley   Total critical care time: 50 minutes  Critical care  time was exclusive of separately billable procedures and treating other patients.  Critical care was necessary to treat or prevent imminent or life-threatening deterioration.  Critical care was time spent personally by me on the following activities: development of treatment plan with patient and/or surrogate as well as nursing, discussions with consultants, evaluation of patient's response to treatment, examination of patient, obtaining history from patient or surrogate, ordering and  performing treatments and interventions, ordering and review of laboratory studies, ordering and review of radiographic studies, pulse oximetry and re-evaluation of patient's condition.    For questions or updates, please contact [facility] Please consult [email or web address] for contact info under        Signed, Thurmon Fair, MD

## 2023-08-20 NOTE — Progress Notes (Signed)
eLink Physician-Brief Progress Note Patient Name: Antonio [name]. DOB: Apr 10, 1956 MRN: [id number]   Date of Service  08/20/2023  HPI/Events of Note  67 yo M w/ pertinent PMH of HTN, CAD s/p stent placement, PAD s/p L BKA and LSFA stenting 2013, T2DM, right foot osteomyelitis s/p right iliofemoral endarterectomy and right metatarsal amputation presents to [facility] ED on 9/19 w/ UTI.   Persistent lower extremity pain and claudication.  Able to tolerate oral medications.  Anticipating potential amputation of the lower extremity.  eICU Interventions  Make Dilaudid breakthrough medication.  Initiate oxycodone p.o. sliding scale for lower extremity pain.     Intervention Category Intermediate Interventions: Pain - evaluation and management  Antonio [name] 08/20/2023, 2:36 AM

## 2023-08-20 NOTE — Plan of Care (Signed)
  Problem: Cardiovascular: Goal: Ability to achieve and maintain adequate cardiovascular perfusion will improve Outcome: Progressing Goal: Vascular access site(s) Level 0-1 will be maintained Outcome: Progressing   Problem: Clinical Measurements: Goal: Respiratory complications will improve Outcome: Progressing   Problem: Nutrition: Goal: Adequate nutrition will be maintained Outcome: Progressing   Problem: Pain Managment: Goal: General experience of comfort will improve Outcome: Not Progressing

## 2023-08-21 ENCOUNTER — Inpatient Hospital Stay (HOSPITAL_COMMUNITY): Admission: EM | Disposition: E | Payer: Self-pay | Source: Home / Self Care | Attending: Internal Medicine

## 2023-08-21 DIAGNOSIS — I442 Atrioventricular block, complete: Secondary | ICD-10-CM | POA: Diagnosis not present

## 2023-08-21 DIAGNOSIS — I2510 Atherosclerotic heart disease of native coronary artery without angina pectoris: Secondary | ICD-10-CM

## 2023-08-21 DIAGNOSIS — I214 Non-ST elevation (NSTEMI) myocardial infarction: Secondary | ICD-10-CM | POA: Diagnosis not present

## 2023-08-21 DIAGNOSIS — I5021 Acute systolic (congestive) heart failure: Secondary | ICD-10-CM | POA: Diagnosis not present

## 2023-08-21 HISTORY — PX: RIGHT/LEFT HEART CATH AND CORONARY ANGIOGRAPHY: CATH118266

## 2023-08-21 LAB — POCT I-STAT 7, (LYTES, BLD GAS, ICA,H+H)
Acid-base deficit: 3 mmol/L — ABNORMAL HIGH (ref 0.0–2.0)
Bicarbonate: 24.5 mmol/L (ref 20.0–28.0)
Calcium, Ion: 1.13 mmol/L — ABNORMAL LOW (ref 1.15–1.40)
HCT: 40 % (ref 39.0–52.0)
Hemoglobin: 13.6 g/dL (ref 13.0–17.0)
O2 Saturation: 87 %
Potassium: 4.1 mmol/L (ref 3.5–5.1)
Sodium: 135 mmol/L (ref 135–145)
TCO2: 26 mmol/L (ref 22–32)
pCO2 arterial: 53.8 mmHg — ABNORMAL HIGH (ref 32–48)
pH, Arterial: 7.266 — ABNORMAL LOW (ref 7.35–7.45)
pO2, Arterial: 62 mmHg — ABNORMAL LOW (ref 83–108)

## 2023-08-21 LAB — RENAL FUNCTION PANEL
Albumin: 2.2 g/dL — ABNORMAL LOW (ref 3.5–5.0)
Albumin: 2.4 g/dL — ABNORMAL LOW (ref 3.5–5.0)
Anion gap: 11 (ref 5–15)
Anion gap: 14 (ref 5–15)
BUN: 30 mg/dL — ABNORMAL HIGH (ref 8–23)
BUN: 27 mg/dL — ABNORMAL HIGH (ref 8–23)
CO2: 21 mmol/L — ABNORMAL LOW (ref 22–32)
CO2: 20 mmol/L — ABNORMAL LOW (ref 22–32)
Calcium: 7 mg/dL — ABNORMAL LOW (ref 8.9–10.3)
Calcium: 7.5 mg/dL — ABNORMAL LOW (ref 8.9–10.3)
Chloride: 102 mmol/L (ref 98–111)
Chloride: 100 mmol/L (ref 98–111)
Creatinine, Ser: 2.18 mg/dL — ABNORMAL HIGH (ref 0.61–1.24)
Creatinine, Ser: 2.15 mg/dL — ABNORMAL HIGH (ref 0.61–1.24)
GFR, Estimated: 33 mL/min — ABNORMAL LOW (ref >60)
GFR, Estimated: 33 mL/min — ABNORMAL LOW (ref >60)
Glucose, Bld: 108 mg/dL — ABNORMAL HIGH (ref 70–99)
Glucose, Bld: 117 mg/dL — ABNORMAL HIGH (ref 70–99)
Phosphorus: 4.3 mg/dL (ref 2.5–4.6)
Phosphorus: 3.9 mg/dL (ref 2.5–4.6)
Potassium: 3.9 mmol/L (ref 3.5–5.1)
Potassium: 4 mmol/L (ref 3.5–5.1)
Sodium: 134 mmol/L — ABNORMAL LOW (ref 135–145)
Sodium: 134 mmol/L — ABNORMAL LOW (ref 135–145)

## 2023-08-21 LAB — POCT I-STAT EG7
Acid-base deficit: 2 mmol/L (ref 0.0–2.0)
Bicarbonate: 26.8 mmol/L (ref 20.0–28.0)
Calcium, Ion: 1.12 mmol/L — ABNORMAL LOW (ref 1.15–1.40)
HCT: 41 % (ref 39.0–52.0)
Hemoglobin: 13.9 g/dL (ref 13.0–17.0)
O2 Saturation: 45 %
Potassium: 4.1 mmol/L (ref 3.5–5.1)
Sodium: 135 mmol/L (ref 135–145)
TCO2: 29 mmol/L (ref 22–32)
pCO2, Ven: 62 mmHg — ABNORMAL HIGH (ref 44–60)
pH, Ven: 7.244 — ABNORMAL LOW (ref 7.25–7.43)
pO2, Ven: 30 mmHg — CL (ref 32–45)

## 2023-08-21 LAB — MAGNESIUM: Magnesium: 2.5 mg/dL — ABNORMAL HIGH (ref 1.7–2.4)

## 2023-08-21 LAB — POTASSIUM
Potassium: 3.8 mmol/L (ref 3.5–5.1)
Potassium: 3.8 mmol/L (ref 3.5–5.1)
Potassium: 3.8 mmol/L (ref 3.5–5.1)
Potassium: 3.8 mmol/L (ref 3.5–5.1)
Potassium: 4.5 mmol/L (ref 3.5–5.1)

## 2023-08-21 LAB — GLUCOSE, CAPILLARY
Glucose-Capillary: 105 mg/dL — ABNORMAL HIGH (ref 70–99)
Glucose-Capillary: 119 mg/dL — ABNORMAL HIGH (ref 70–99)
Glucose-Capillary: 129 mg/dL — ABNORMAL HIGH (ref 70–99)
Glucose-Capillary: 158 mg/dL — ABNORMAL HIGH (ref 70–99)
Glucose-Capillary: 311 mg/dL — ABNORMAL HIGH (ref 70–99)

## 2023-08-21 LAB — CK: Total CK: 3036 U/L — ABNORMAL HIGH (ref 49–397)

## 2023-08-21 SURGERY — RIGHT/LEFT HEART CATH AND CORONARY ANGIOGRAPHY
Anesthesia: LOCAL

## 2023-08-21 MED ORDER — ORAL CARE MOUTH RINSE
15.0000 mL | Freq: Three times a day (TID) | OROMUCOSAL | Status: DC
  Administered 2023-08-21 – 2023-08-24 (×10): 15 mL via OROMUCOSAL

## 2023-08-21 MED ORDER — SODIUM CHLORIDE 0.9 % IV SOLN: INTRAVENOUS | Status: DC

## 2023-08-21 MED ORDER — VERAPAMIL HCL 2.5 MG/ML IV SOLN
INTRAVENOUS | Status: DC | PRN
  Administered 2023-08-21: 10 mL via INTRA_ARTERIAL

## 2023-08-21 MED ORDER — IOHEXOL 350 MG/ML SOLN
INTRAVENOUS | Status: DC | PRN
  Administered 2023-08-21: 15 mL

## 2023-08-21 MED ORDER — VERAPAMIL HCL 2.5 MG/ML IV SOLN
INTRAVENOUS | Status: AC
  Filled 2023-08-21: qty 2

## 2023-08-21 MED ORDER — LIDOCAINE HCL (PF) 1 % IJ SOLN
INTRAMUSCULAR | Status: AC
  Filled 2023-08-21: qty 30

## 2023-08-21 MED ORDER — FENTANYL CITRATE (PF) 100 MCG/2ML IJ SOLN
INTRAMUSCULAR | Status: AC
  Filled 2023-08-21: qty 2

## 2023-08-21 MED ORDER — LIDOCAINE HCL (PF) 1 % IJ SOLN
INTRAMUSCULAR | Status: DC | PRN
  Administered 2023-08-21: 3 mL
  Administered 2023-08-21: 4 mL

## 2023-08-21 MED ORDER — HEPARIN SODIUM (PORCINE) 1000 UNIT/ML IJ SOLN
INTRAMUSCULAR | Status: DC | PRN
  Administered 2023-08-21: 5000 [IU] via INTRAVENOUS

## 2023-08-21 MED ORDER — METHYLPREDNISOLONE SODIUM SUCC 125 MG IJ SOLR
125.0000 mg | Freq: Once | INTRAMUSCULAR | Status: AC
  Administered 2023-08-21: 125 mg via INTRAVENOUS
  Filled 2023-08-21: qty 2

## 2023-08-21 MED ORDER — SODIUM CHLORIDE 0.9 % IV SOLN: 250.0000 mL | INTRAVENOUS | Status: DC | PRN

## 2023-08-21 MED ORDER — HEPARIN (PORCINE) IN NACL 1000-0.9 UT/500ML-% IV SOLN
INTRAVENOUS | Status: DC | PRN
  Administered 2023-08-21 (×2): 500 mL

## 2023-08-21 MED ORDER — ACETAMINOPHEN 325 MG PO TABS: 650.0000 mg | ORAL_TABLET | ORAL | Status: DC | PRN

## 2023-08-21 MED ORDER — SODIUM CHLORIDE 0.9% FLUSH
3.0000 mL | Freq: Two times a day (BID) | INTRAVENOUS | Status: DC
  Administered 2023-08-21 – 2023-08-24 (×5): 3 mL via INTRAVENOUS

## 2023-08-21 MED ORDER — HEPARIN SODIUM (PORCINE) 1000 UNIT/ML IJ SOLN
INTRAMUSCULAR | Status: AC
  Filled 2023-08-21: qty 10

## 2023-08-21 MED ORDER — DIPHENHYDRAMINE HCL 50 MG/ML IJ SOLN
25.0000 mg | Freq: Once | INTRAMUSCULAR | Status: AC
  Administered 2023-08-21: 25 mg via INTRAVENOUS
  Filled 2023-08-21: qty 1

## 2023-08-21 MED ORDER — MIDAZOLAM HCL 2 MG/2ML IJ SOLN
INTRAMUSCULAR | Status: AC
  Filled 2023-08-21: qty 2

## 2023-08-21 MED ORDER — SODIUM CHLORIDE 0.9% FLUSH: 3.0000 mL | INTRAVENOUS | Status: DC | PRN

## 2023-08-21 SURGICAL SUPPLY — 12 items
CATH BALLN WEDGE 5F 110CM (CATHETERS) IMPLANT
CATH INFINITI 5FR JK (CATHETERS) IMPLANT
DEVICE RAD COMP TR BAND LRG (VASCULAR PRODUCTS) IMPLANT
GLIDESHEATH SLEND SS 6F .021 (SHEATH) IMPLANT
GUIDEWIRE .025 260CM (WIRE) IMPLANT
GUIDEWIRE INQWIRE 1.5J.035X260 (WIRE) IMPLANT
INQWIRE 1.5J .035X260CM (WIRE) ×1
MAT PREVALON FULL STRYKER (MISCELLANEOUS) IMPLANT
PACK CARDIAC CATHETERIZATION (CUSTOM PROCEDURE TRAY) ×1 IMPLANT
SET ATX-X65L (MISCELLANEOUS) IMPLANT
SHEATH GLIDE SLENDER 4/5FR (SHEATH) IMPLANT
SHEATH PROBE COVER 6X72 (BAG) IMPLANT

## 2023-08-21 NOTE — Progress Notes (Signed)
Progress Note    08/07/2023 8:45 AM 6 Days Post-Op  Subjective:  feels ok this morning. Left leg still hurting    Vitals:   08/17/2023 0737 08/13/2023 0745  BP:  130/69  Pulse:  81  Resp:  18  Temp:  98 F (36.7 C)  SpO2: 98% 100%    Physical Exam: General:  sitting up in bed Lungs:  nonlabored, on nasal cannula Extremities:  unchanged ischemia left BKA. Right TMA with chronic wounds, faint AT doppler signal   CBC    Component Value Date/Time   WBC 10.9 (H) 08/20/2023 0511   RBC 4.91 08/20/2023 0511   HGB 13.6 08/20/2023 1402   HCT 40.0 08/20/2023 1402   PLT 141 (L) 08/20/2023 0511   MCV 84.1 08/20/2023 0511   MCH 24.6 (L) 08/20/2023 0511   MCHC 29.3 (L) 08/20/2023 0511   RDW 15.4 08/20/2023 0511   LYMPHSABS 1.2 2023-09-01 1316   MONOABS 0.6 09-01-23 1316   EOSABS 0.2 2023/09/01 1316   BASOSABS 0.0 01-Sep-2023 1316    BMET    Component Value Date/Time   NA 134 (L) 08/11/2023 0300   K 3.8 08/06/2023 0300   K 3.9 08/16/2023 0300   CL 102 08/08/2023 0300   CO2 21 (L) 08/11/2023 0300   GLUCOSE 108 (H) 08/17/2023 0300   BUN 30 (H) 08/23/2023 0300   CREATININE 2.18 (H) 08/13/2023 0300   CALCIUM 7.0 (L) 08/05/2023 0300   GFRNONAA 33 (L) 08/08/2023 0300   GFRAA >60 02/15/2019 0455    INR    Component Value Date/Time   INR 1.0 02/03/2022 1947     Intake/Output Summary (Last 24 hours) at 08/26/2023 0845 Last data filed at 08/11/2023 0700 Gross per 24 hour  Intake 1334.27 ml  Output 482 ml  Net 852.27 ml      Assessment/Plan:  67 y.o. male with right TMA and left BKA wounds  -Left BKA with unchanged ischemic appearance. Still causing patient pain -Right TMA unchanged with faint AT doppler signal -Currently no definitive plans for intervention/amputation for this patient. His cardiac status is poor. Cardiology is attempting conservative management at this time. Palliative is also on board   Loel Dubonnet, [facility] [phone] 07/29/2023 8:45 AM

## 2023-08-21 NOTE — Interval H&P Note (Signed)
History and Physical Interval Note:  08/09/2023 2:55 PM  Antonio [name].  has presented today for surgery, with the diagnosis of nstemi.  The various methods of treatment have been discussed with the patient and family. After consideration of risks, benefits and other options for treatment, the patient has consented to  Procedure(s): RIGHT/LEFT HEART CATH AND CORONARY ANGIOGRAPHY (N/A) as a surgical intervention.  The patient's history has been reviewed, patient examined, no change in status, stable for surgery.  I have reviewed the patient's chart and labs.  Questions were answered to the patient's satisfaction.     Lorine Bears

## 2023-08-21 NOTE — Plan of Care (Signed)
Problem: Cardiovascular: Goal: Ability to achieve and maintain adequate cardiovascular perfusion will improve Outcome: Progressing   Problem: Pain Managment: Goal: General experience of comfort will improve Outcome: Progressing

## 2023-08-21 NOTE — Progress Notes (Signed)
This chaplain responded to PMT [facility] consult for Pt. prayer. The chaplain understands from chart review and RN update the Pt. will proceed with coronary angiography.   The Pt. is awake and open to a visit. The Pt. son-Josh is at the bedside. Two additional children arrive as the chaplain steps out. The Pt. freely speaks of his role as dad and the importance of his children. The chaplain understands the Pt. and children are together to have a "prayer meeting'.  The Pt. is accepting of the chaplain's intercessory prayer and F/U spiritual care.  Chaplain Stephanie Acre [phone]

## 2023-08-21 NOTE — Progress Notes (Signed)
cardiogenic shock. Anuric overnight despite some fluid back via intermittent bolus and CRRT. Poor chance of renal recovery in near to mid term, probably long term as well. Survival outside of the hospital would require intermittent HD, perhaps indefinitely. If this is concordant with goals of care, he will need [facility] prior to leaving the hospital.   Hypovolemia Hypervolemia => hypovolemia. Low dose NE on at present. Running CRRT net even to +50 mL/h.   Hyperkalemia, resolved 4/2.5  pre and post with 2/2.5 dialysate.   Acute inferior MI Query type 2 MI versus occlusive myocardial infarction, favor type 2.   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 BP precludes use of heart failure medications.  Intermittent 2nd degree AV block with hypotension Cardiology following. Favor conservative management, likely poor outcome with more invasive care.   Respiratory failure Improving with volume optimization.   UTI Antibiotics.  Final recommendations pending attending attestation.  Marrianne Mood MD 08/24/2023, 9:20 AM  103 104 105 103  --  100  --   --  101  --   --  101  --  102  CO2 24   < > 20* 20* 23 20*  --  25  --   --  23  --   --  21*  --  21*  GLUCOSE 207*   < > 122* 128* 121* 132*  --  100*  --   --  145*  --   --  161*  --  108*  BUN 32*   < > 86* 92* 76* 56*  --  43*  --   --  38*  --   --  34*  --  30*  CREATININE 2.83*   < > 5.62* 5.87* 4.89* 3.30*  --  2.65*  --   --  2.31*  --   --  2.28*  --  2.18*  ALBUMIN 1.9*  --   --   --  2.0* 2.0*  --  2.0*  --   --  2.1*  --   --  2.3*  --  2.2*  CALCIUM 8.4*   < > 7.6* 7.6* 7.1* 7.2*  --  7.3*  --   --   7.3*  --   --  7.4*  --  7.0*  PHOS  --    < > 8.3*  --  7.9* 5.8*  --  5.0*  --   --  4.2  --   --  4.7*  --  4.3   < > = values in this interval not displayed.   Liver Function Tests: Recent Labs  Lab 08/26/2023 1316 08/04/2023 2110 08/18/23 1611 08/20/23 0511 08/20/23 1616 08/14/2023 0300  AST 12* 82*  --   --   --   --   ALT 10 28  --   --   --   --   ALKPHOS 80 89  --   --   --   --   BILITOT 0.6 0.8  --   --   --   --   PROT 6.3* 5.4*  --   --   --   --   ALBUMIN 2.4* 1.9*   < > 2.1* 2.3* 2.2*   < > = values in this interval not displayed.   Recent Labs  Lab 08/02/2023 1316  LIPASE 30   Recent Labs  Lab 08/18/23 1811  AMMONIA 28   CBC: Recent Labs  Lab 08/20/2023 1316 08/14/2023 1535 08/17/23 0946 08/18/23 0258 08/26/2023 0319 08/20/23 0511 08/20/23 1402  WBC 7.8   < > 15.9* 17.3* 11.8* 10.9*  --   NEUTROABS 5.7  --   --   --   --   --   --   HGB 12.9*   < > 12.6* 12.1* 12.5* 12.1* 13.6  HCT 43.1   < > 42.2 40.3 40.7 41.3 40.0  MCV 83.7   < > 83.7 86.1 84.4 84.1  --   PLT 268   < > 149* 133* 113* 141*  --    < > = values in this interval not displayed.   Recent Labs  Lab 08/20/23 1633 08/20/23 2023 08/20/23 2359 08/16/2023 0336 08/02/2023 0835  GLUCAP 135* 186* 129* 119* 105*    Iron Studies: No results for input(s): "IRON", "TIBC", "TRANSFERRIN", "FERRITIN" in the last 168 hours.  Xrays/Other Studies: No results found.  Assessment/Plan:  AKI on CKD 3B ATN in setting of septic and  103 104 105 103  --  100  --   --  101  --   --  101  --  102  CO2 24   < > 20* 20* 23 20*  --  25  --   --  23  --   --  21*  --  21*  GLUCOSE 207*   < > 122* 128* 121* 132*  --  100*  --   --  145*  --   --  161*  --  108*  BUN 32*   < > 86* 92* 76* 56*  --  43*  --   --  38*  --   --  34*  --  30*  CREATININE 2.83*   < > 5.62* 5.87* 4.89* 3.30*  --  2.65*  --   --  2.31*  --   --  2.28*  --  2.18*  ALBUMIN 1.9*  --   --   --  2.0* 2.0*  --  2.0*  --   --  2.1*  --   --  2.3*  --  2.2*  CALCIUM 8.4*   < > 7.6* 7.6* 7.1* 7.2*  --  7.3*  --   --   7.3*  --   --  7.4*  --  7.0*  PHOS  --    < > 8.3*  --  7.9* 5.8*  --  5.0*  --   --  4.2  --   --  4.7*  --  4.3   < > = values in this interval not displayed.   Liver Function Tests: Recent Labs  Lab 08/26/2023 1316 08/04/2023 2110 08/18/23 1611 08/20/23 0511 08/20/23 1616 08/14/2023 0300  AST 12* 82*  --   --   --   --   ALT 10 28  --   --   --   --   ALKPHOS 80 89  --   --   --   --   BILITOT 0.6 0.8  --   --   --   --   PROT 6.3* 5.4*  --   --   --   --   ALBUMIN 2.4* 1.9*   < > 2.1* 2.3* 2.2*   < > = values in this interval not displayed.   Recent Labs  Lab 08/02/2023 1316  LIPASE 30   Recent Labs  Lab 08/18/23 1811  AMMONIA 28   CBC: Recent Labs  Lab 08/20/2023 1316 08/14/2023 1535 08/17/23 0946 08/18/23 0258 08/26/2023 0319 08/20/23 0511 08/20/23 1402  WBC 7.8   < > 15.9* 17.3* 11.8* 10.9*  --   NEUTROABS 5.7  --   --   --   --   --   --   HGB 12.9*   < > 12.6* 12.1* 12.5* 12.1* 13.6  HCT 43.1   < > 42.2 40.3 40.7 41.3 40.0  MCV 83.7   < > 83.7 86.1 84.4 84.1  --   PLT 268   < > 149* 133* 113* 141*  --    < > = values in this interval not displayed.   Recent Labs  Lab 08/20/23 1633 08/20/23 2023 08/20/23 2359 08/16/2023 0336 08/02/2023 0835  GLUCAP 135* 186* 129* 119* 105*    Iron Studies: No results for input(s): "IRON", "TIBC", "TRANSFERRIN", "FERRITIN" in the last 168 hours.  Xrays/Other Studies: No results found.  Assessment/Plan:  AKI on CKD 3B ATN in setting of septic and  103 104 105 103  --  100  --   --  101  --   --  101  --  102  CO2 24   < > 20* 20* 23 20*  --  25  --   --  23  --   --  21*  --  21*  GLUCOSE 207*   < > 122* 128* 121* 132*  --  100*  --   --  145*  --   --  161*  --  108*  BUN 32*   < > 86* 92* 76* 56*  --  43*  --   --  38*  --   --  34*  --  30*  CREATININE 2.83*   < > 5.62* 5.87* 4.89* 3.30*  --  2.65*  --   --  2.31*  --   --  2.28*  --  2.18*  ALBUMIN 1.9*  --   --   --  2.0* 2.0*  --  2.0*  --   --  2.1*  --   --  2.3*  --  2.2*  CALCIUM 8.4*   < > 7.6* 7.6* 7.1* 7.2*  --  7.3*  --   --   7.3*  --   --  7.4*  --  7.0*  PHOS  --    < > 8.3*  --  7.9* 5.8*  --  5.0*  --   --  4.2  --   --  4.7*  --  4.3   < > = values in this interval not displayed.   Liver Function Tests: Recent Labs  Lab 08/26/2023 1316 08/04/2023 2110 08/18/23 1611 08/20/23 0511 08/20/23 1616 08/14/2023 0300  AST 12* 82*  --   --   --   --   ALT 10 28  --   --   --   --   ALKPHOS 80 89  --   --   --   --   BILITOT 0.6 0.8  --   --   --   --   PROT 6.3* 5.4*  --   --   --   --   ALBUMIN 2.4* 1.9*   < > 2.1* 2.3* 2.2*   < > = values in this interval not displayed.   Recent Labs  Lab 08/02/2023 1316  LIPASE 30   Recent Labs  Lab 08/18/23 1811  AMMONIA 28   CBC: Recent Labs  Lab 08/20/2023 1316 08/14/2023 1535 08/17/23 0946 08/18/23 0258 08/26/2023 0319 08/20/23 0511 08/20/23 1402  WBC 7.8   < > 15.9* 17.3* 11.8* 10.9*  --   NEUTROABS 5.7  --   --   --   --   --   --   HGB 12.9*   < > 12.6* 12.1* 12.5* 12.1* 13.6  HCT 43.1   < > 42.2 40.3 40.7 41.3 40.0  MCV 83.7   < > 83.7 86.1 84.4 84.1  --   PLT 268   < > 149* 133* 113* 141*  --    < > = values in this interval not displayed.   Recent Labs  Lab 08/20/23 1633 08/20/23 2023 08/20/23 2359 08/16/2023 0336 08/02/2023 0835  GLUCAP 135* 186* 129* 119* 105*    Iron Studies: No results for input(s): "IRON", "TIBC", "TRANSFERRIN", "FERRITIN" in the last 168 hours.  Xrays/Other Studies: No results found.  Assessment/Plan:  AKI on CKD 3B ATN in setting of septic and  cardiogenic shock. Anuric overnight despite some fluid back via intermittent bolus and CRRT. Poor chance of renal recovery in near to mid term, probably long term as well. Survival outside of the hospital would require intermittent HD, perhaps indefinitely. If this is concordant with goals of care, he will need [facility] prior to leaving the hospital.   Hypovolemia Hypervolemia => hypovolemia. Low dose NE on at present. Running CRRT net even to +50 mL/h.   Hyperkalemia, resolved 4/2.5  pre and post with 2/2.5 dialysate.   Acute inferior MI Query type 2 MI versus occlusive myocardial infarction, favor type 2.   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 BP precludes use of heart failure medications.  Intermittent 2nd degree AV block with hypotension Cardiology following. Favor conservative management, likely poor outcome with more invasive care.   Respiratory failure Improving with volume optimization.   UTI Antibiotics.  Final recommendations pending attending attestation.  Marrianne Mood MD 08/24/2023, 9:20 AM

## 2023-08-21 NOTE — Progress Notes (Signed)
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  Continue Renal MVI BID  NUTRITION DIAGNOSIS:   Moderate Malnutrition related to chronic illness as evidenced by mild muscle depletion, mild fat depletion.  Being addressed  GOAL:   Patient will meet greater than or equal to 90% of their needs  Not met but addressing  MONITOR:   PO intake, Supplement acceptance, Skin, I & O's, Weight trends  REASON FOR ASSESSMENT:   Consult, Ventilator Enteral/tube feeding initiation and management  ASSESSMENT:   67 yo male presented to ED on 9/19 with UTI symptoms; while in ED, pt with PEA arrest requiring intubation. Pt with complete heart block and cardiogenic shock. PMH includes DM, HTN, HLD, CAD, GERD, L BKA, R transmetatarsal amputation with hx of osteomyelitis and right iliofemoral endarterectomy, hx of trach/PEG tube, +smoker (tobacco use)  9/19 Admitted, Intubated 9/20 TF initiated 9/21 Extubated, TF stopped 9/22 CRRT initiated 9/23 Diet advanced to Reg 9/25 [facility] 9/26 GOC discussions initiated  Noted poor overall prognosis per MD notes. Pt is DNR/DNI  Pt remains on CRRT, UF 0-50 ml/hr   +pain in both legs with L BKA continuing to demarcate, R TMA unchanged . Noted possible conversion of L BKA to AKA next week, noted high risk that this does not heel  Pt taking some Ensure per RN, taking some po  as well. No recorded po intake since 9/24, po 20% on that day.   Noted newly documented DTI to sacrum on 9/24  Labs: sodium 132 (L), potassium/phos/mag wdl Meds: ss novolog, semglee, Renal MVI BID,    Diet Order:  HEART HEALTHY   EDUCATION NEEDS:   Not appropriate for education at this time  Skin:  Skin Assessment: Skin Integrity Issues: Skin Integrity Issues:: Other (Comment) Other: noted open wounds on RLE  Last BM:  PTA  Height:   Ht Readings from Last 1 Encounters:  08/23/2023 6' (1.829 m)    Weight:   Wt Readings from Last 1 Encounters:  08/03/2023 100.8 kg     BMI:  Body mass index is 30.14 kg/m.  Estimated Nutritional Needs:   Kcal:  1900-2200 kcals  Protein:  125-150 g  Fluid:  1L plus UOP   [name] MS, RDN, LDN, CNSC Registered Dietitian 3 Clinical Nutrition RD Pager and On-Call Pager Number Located in [place]

## 2023-08-21 NOTE — Progress Notes (Signed)
Patient Name: Abubakar Rensel [facility]. Date of Encounter: 07/29/2023 [facility] Cardiologist: Rollene Rotunda, MD   Interval Summary  .    Has not had any further problems with atrioventricular block since approximately 3 PM yesterday (gradually went from 2: 1 AV block, 3: 2 AV block, 1: 1 AV conduction) Has maintained normal sinus rhythm since then. Continues to have pain in both lower extremities and has mild chest pain as well. Has hoarseness, rather than dyspnea. Remains anuric. On very low-dose pressors.  Vital Signs .    Vitals:   08/08/2023 0730 08/02/2023 0734 08/12/2023 0737 08/13/2023 0745  BP: 120/74   130/69  Pulse: 85   81  Resp: (!) 21   18  Temp:    98 F (36.7 C)  TempSrc:    Oral  SpO2: 100% 98% 98% 100%  Weight:      Height:        Intake/Output Summary (Last 24 hours) at 08/16/2023 0908 Last data filed at 08/12/2023 0700 Gross per 24 hour  Intake 1316.96 ml  Output 482.3 ml  Net 834.66 ml      08/12/2023    5:15 AM 08/20/2023    6:00 AM 08/22/2023    3:42 AM  Last 3 Weights  Weight (lbs) 222 lb 3.6 oz 223 lb 12.3 oz 230 lb 13.2 oz  Weight (kg) 100.8 kg 101.5 kg 104.7 kg      Telemetry/ECG    NSR - Personally Reviewed  Physical Exam .   GEN: No acute distress.   Neck: No JVD Cardiac: RRR, no murmurs, rubs, or gallops.  Respiratory: Clear to auscultation bilaterally. GI: Soft, nontender, non-distended  MS: No edema.  Cold, cyanotic stump of left BKA.  Unchanged ulcerations right transmetatarsal amputation site.  Assessment & Plan .     Shock: Improved after giving back some volume.  On very low-dose of pressors. Second-degree AV block, Mobitz type I: Recurrent AV block suggest that there is still ischemia in the inferior wall, which is nondisplaced.  He also was having some angina.  Suggesting some viability in the right coronary artery territory. CHF: no evidence of pulmonary edema, I believe he will tolerate some volume. EF <20% by report  (25-30% on my review of echo).  Low BP does not allow HF therapies. CAD s/p NSTEMI: He is having some angina and could therefore benefit from revascularization, but ECG does not show changes of active ischemia.  Still in no shape to undergo invasive coronary evaluation.  On my review of echo there is regional variation in contractility.   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 PAD: When his BP is low his legs become even more ischemic and he has severe pain. The L BKA stump is very cold and cyanotic.  CRITICAL CARE Performed by: Rachelle Hora Liah Morr   Total critical care time: 30 minutes  Critical care time was exclusive of separately billable procedures and treating other patients.  Critical care was necessary to treat or prevent imminent or life-threatening deterioration.  Critical care was time spent personally by me on the following activities: development of treatment plan with patient and/or surrogate as well as nursing, discussions with consultants, evaluation of patient's response to treatment, examination of patient, obtaining history from patient or surrogate, ordering and performing treatments and interventions, ordering and review of laboratory studies, ordering and review of radiographic studies, pulse oximetry and  re-evaluation of patient's condition.   For questions or updates, please contact [facility] Please consult [email or web address] for contact info under        Signed, Thurmon Fair, MD

## 2023-08-21 NOTE — H&P (View-Only) (Signed)
Patient Name: Antonio [name]. Date of Encounter: 07/29/2023 [facility] Cardiologist: Rollene Rotunda, MD   Interval Summary  .    Has not had any further problems with atrioventricular block since approximately 3 PM yesterday (gradually went from 2: 1 AV block, 3: 2 AV block, 1: 1 AV conduction) Has maintained normal sinus rhythm since then. Continues to have pain in both lower extremities and has mild chest pain as well. Has hoarseness, rather than dyspnea. Remains anuric. On very low-dose pressors.  Vital Signs .    Vitals:   08/08/2023 0730 08/02/2023 0734 08/12/2023 0737 08/13/2023 0745  BP: 120/74   130/69  Pulse: 85   81  Resp: (!) 21   18  Temp:    98 F (36.7 C)  TempSrc:    Oral  SpO2: 100% 98% 98% 100%  Weight:      Height:        Intake/Output Summary (Last 24 hours) at 08/16/2023 0908 Last data filed at 08/12/2023 0700 Gross per 24 hour  Intake 1316.96 ml  Output 482.3 ml  Net 834.66 ml      08/12/2023    5:15 AM 08/20/2023    6:00 AM 08/22/2023    3:42 AM  Last 3 Weights  Weight (lbs) 222 lb 3.6 oz 223 lb 12.3 oz 230 lb 13.2 oz  Weight (kg) 100.8 kg 101.5 kg 104.7 kg      Telemetry/ECG    NSR - Personally Reviewed  Physical Exam .   GEN: No acute distress.   Neck: No JVD Cardiac: RRR, no murmurs, rubs, or gallops.  Respiratory: Clear to auscultation bilaterally. GI: Soft, nontender, non-distended  MS: No edema.  Cold, cyanotic stump of left BKA.  Unchanged ulcerations right transmetatarsal amputation site.  Assessment & Plan .     Shock: Improved after giving back some volume.  On very low-dose of pressors. Second-degree AV block, Mobitz type I: Recurrent AV block suggest that there is still ischemia in the inferior wall, which is nondisplaced.  He also was having some angina.  Suggesting some viability in the right coronary artery territory. CHF: no evidence of pulmonary edema, I believe he will tolerate some volume. EF <20% by report  (25-30% on my review of echo).  Low BP does not allow HF therapies. CAD s/p NSTEMI: He is having some angina and could therefore benefit from revascularization, but ECG does not show changes of active ischemia.  Still in no shape to undergo invasive coronary evaluation.  On my review of echo there is regional variation in contractility.   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 PAD: When his BP is low his legs become even more ischemic and he has severe pain. The L BKA stump is very cold and cyanotic.  CRITICAL CARE Performed by: Rachelle Hora Liah Morr   Total critical care time: 30 minutes  Critical care time was exclusive of separately billable procedures and treating other patients.  Critical care was necessary to treat or prevent imminent or life-threatening deterioration.  Critical care was time spent personally by me on the following activities: development of treatment plan with patient and/or surrogate as well as nursing, discussions with consultants, evaluation of patient's response to treatment, examination of patient, obtaining history from patient or surrogate, ordering and performing treatments and interventions, ordering and review of laboratory studies, ordering and review of radiographic studies, pulse oximetry and  re-evaluation of patient's condition.   For questions or updates, please contact [facility] Please consult [email or web address] for contact info under        Signed, Thurmon Fair, MD

## 2023-08-21 NOTE — Progress Notes (Signed)
After reviewing most recent evaluation from Nephrology, likelihood of renal function recovery is very low. Therefore, it does not appear necessary to delay coronary angiography any longer. Discussed the procedure, its risks and potential benefits with Antonio [name] and his son [name]. They agree top proceed. Reviewed his complicated and high risk clinical scenario with Interventional Cardiology.

## 2023-08-21 NOTE — Progress Notes (Signed)
NAME:  Antonio [name]., MRN:  [id number], DOB:  16-Dec-1955, LOS: 6 ADMISSION DATE:  Sep 10, 2023, CONSULTATION DATE:  10-Sep-2023 REFERRING MD:  Dr. Criss Alvine, CHIEF COMPLAINT:  cardiac arrest   History of Present Illness:  Patient is a 67 yo M w/ pertinent PMH of HTN, CAD s/p stent placement, PAD s/p L BKA and LSFA stenting 2013, T2DM, right foot osteomyelitis s/p right iliofemoral endarterectomy and right metatarsal amputation presents to [facility] ED on 2023-09-10 w/ UTI.  Patient having UTI symptoms for the past 4-5 days. Also having b/l back pain and abd pain. Also having chronic right leg pain that has been progressively worsening over the last 4-5 months. On arrival patient alert and hypertensive 162/120. Afebrile and wbc 7.8. UA w/ leukocytes and uc pending. Started on rocephin. Patient also w/ creat 2.32. Patient taken to vascular lab and dvt US with no evidence of dvt and arterial duplex showing 75-99% stenosis in deep femoral artery and 50-74% stenosis in superficial femoral artery recommending vascular consult.   While in ED patient found became unresponsive w/ clinched teeth. Patient was pulseless and cpr started w/ rosc in 2 minutes. Attempted to intubated after code and very difficult airway and initially could not pass ETT. Patient coded during intubation procedure. 6.5 ETT was able to be placed and ROSC was achieved. Patient on levo and epi. Patient hr 50s. EKG showing complete AV block. Cards consulted and taking for emergent cath. PCCM consulted for icu admission.  Pertinent  Medical History   Past Medical History:  Diagnosis Date   Coronary artery disease    Diabetes mellitus without complication (HCC)    typ2   GERD (gastroesophageal reflux disease)    Hyperlipidemia    Hypertension    Osteomyelitis (HCC) 03/25/2017   RT FOOT    Significant Hospital Events: Including procedures, antibiotic start and stop dates in addition to other pertinent events   2023/09/10 admitted initially w/ UTI;  while in ED pea arrest, difficult intubation. Complete heart block going to cath lab 9/21 extubated 9/22 CRRT start 9/25 high degree AV block self-resolved  Interim History / Subjective:  On low dose pressors. Denies complaints other than from LE wounds.  Objective   Blood pressure 100/66, pulse 77, temperature 98 F (36.7 C), temperature source Oral, resp. rate 15, height 6' (1.829 m), weight 100.8 kg, SpO2 91%.        Intake/Output Summary (Last 24 hours) at 08/02/2023 1055 Last data filed at 08/08/2023 1000 Gross per 24 hour  Intake 1240.2 ml  Output 528.5 ml  Net 711.7 ml   Filed Weights   08/16/2023 0342 08/20/23 0600 08/20/2023 0515  Weight: 104.7 kg 101.5 kg 100.8 kg    Examination: Chronically ill no distress on CRRT Lungs with improved crackles Heart sounds regular, ext warm Abd soft, +BS AOx3 Moves to command Legs look the same  BMP stable on CRRT Improved plts CBC looks good  Resolved Hospital Problem list   HTNsive urgency- resolved with fluid removal Acute respiratory failure w/ hypoxia and hypercapnia- significant emphysema noted on CT; extubated 9/22, intermittent BIPAP need likely now due to volume, significantly improved Metabolic encephalopathy- improved/resolved CPR- induced rib fractures Question CAP, UTI- completing course of abx  Assessment & Plan:   Inferior MI with PEA arrest- brief, seems neuro intact; tolerated ceftriaxone challenge, this was not allergic reaction; cardiology thinks this may just be type II response to sepsis High degree AV block- intermittent, self-resolved yesterday, question related to ongoing CAD  Difficult airway- due to prior trach and 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 Ophelia Charter evaluation 09/10/23 T2DM   - Multimodal pain strategy, he appears more comfortable today - Levophed for MAP 65 - CRRT for now - Discussed  with nephro: okay for LHC, unlikely to see any renal recovery with clinical history; sent message to Dr. Royann Shivers - Once LHC done, VVS to see if anything can be offered on LE - PMT has seen, all interventions desired at this time - Patient updated 9/25 at bedside  Consulting services: nephro, palliative, cardiology, vascular surgery, PT, OT  Best Practice (right click and "Reselect all SmartList Selections" daily)   Diet/type: heart healthy DVT prophylaxis: heparin dvt ppx dose GI prophylaxis: H2B Lines: HD catheter Foley:  yes Code Status:  full code Last date of multidisciplinary goals of care discussion [updated son 9/24]  34 min cc time independent of procedures Myrla Halsted MD PCCM [facility] 08/23/2023, 10:55 AM  Please see [email or web address] for pager details.  From 7A-7P if no response, please call [phone]. After hours, please call ELink [phone].

## 2023-08-22 ENCOUNTER — Encounter (HOSPITAL_COMMUNITY): Payer: Self-pay | Admitting: Internal Medicine

## 2023-08-22 DIAGNOSIS — I441 Atrioventricular block, second degree: Secondary | ICD-10-CM

## 2023-08-22 DIAGNOSIS — I5020 Unspecified systolic (congestive) heart failure: Secondary | ICD-10-CM

## 2023-08-22 DIAGNOSIS — I5082 Biventricular heart failure: Secondary | ICD-10-CM | POA: Diagnosis not present

## 2023-08-22 DIAGNOSIS — R570 Cardiogenic shock: Secondary | ICD-10-CM | POA: Diagnosis not present

## 2023-08-22 DIAGNOSIS — I469 Cardiac arrest, cause unspecified: Secondary | ICD-10-CM | POA: Diagnosis not present

## 2023-08-22 DIAGNOSIS — Z7189 Other specified counseling: Secondary | ICD-10-CM

## 2023-08-22 DIAGNOSIS — I442 Atrioventricular block, complete: Secondary | ICD-10-CM | POA: Diagnosis not present

## 2023-08-22 DIAGNOSIS — N179 Acute kidney failure, unspecified: Secondary | ICD-10-CM

## 2023-08-22 DIAGNOSIS — Z515 Encounter for palliative care: Secondary | ICD-10-CM | POA: Diagnosis not present

## 2023-08-22 DIAGNOSIS — E440 Moderate protein-calorie malnutrition: Secondary | ICD-10-CM | POA: Insufficient documentation

## 2023-08-22 DIAGNOSIS — I5021 Acute systolic (congestive) heart failure: Secondary | ICD-10-CM | POA: Diagnosis not present

## 2023-08-22 DIAGNOSIS — I214 Non-ST elevation (NSTEMI) myocardial infarction: Secondary | ICD-10-CM | POA: Diagnosis not present

## 2023-08-22 DIAGNOSIS — I2721 Secondary pulmonary arterial hypertension: Secondary | ICD-10-CM

## 2023-08-22 DIAGNOSIS — I5023 Acute on chronic systolic (congestive) heart failure: Secondary | ICD-10-CM | POA: Diagnosis not present

## 2023-08-22 DIAGNOSIS — Z66 Do not resuscitate: Secondary | ICD-10-CM

## 2023-08-22 LAB — RENAL FUNCTION PANEL
Albumin: 2.3 g/dL — ABNORMAL LOW (ref 3.5–5.0)
Albumin: 2.3 g/dL — ABNORMAL LOW (ref 3.5–5.0)
Anion gap: 6 (ref 5–15)
Anion gap: 8 (ref 5–15)
BUN: 25 mg/dL — ABNORMAL HIGH (ref 8–23)
BUN: 24 mg/dL — ABNORMAL HIGH (ref 8–23)
CO2: 23 mmol/L (ref 22–32)
CO2: 24 mmol/L (ref 22–32)
Calcium: 7.5 mg/dL — ABNORMAL LOW (ref 8.9–10.3)
Calcium: 7.4 mg/dL — ABNORMAL LOW (ref 8.9–10.3)
Chloride: 103 mmol/L (ref 98–111)
Chloride: 100 mmol/L (ref 98–111)
Creatinine, Ser: 2 mg/dL — ABNORMAL HIGH (ref 0.61–1.24)
Creatinine, Ser: 2.18 mg/dL — ABNORMAL HIGH (ref 0.61–1.24)
GFR, Estimated: 36 mL/min — ABNORMAL LOW (ref >60)
GFR, Estimated: 33 mL/min — ABNORMAL LOW (ref >60)
Glucose, Bld: 189 mg/dL — ABNORMAL HIGH (ref 70–99)
Glucose, Bld: 268 mg/dL — ABNORMAL HIGH (ref 70–99)
Phosphorus: 3.3 mg/dL (ref 2.5–4.6)
Phosphorus: 2.6 mg/dL (ref 2.5–4.6)
Potassium: 4.2 mmol/L (ref 3.5–5.1)
Potassium: 3.9 mmol/L (ref 3.5–5.1)
Sodium: 132 mmol/L — ABNORMAL LOW (ref 135–145)
Sodium: 132 mmol/L — ABNORMAL LOW (ref 135–145)

## 2023-08-22 LAB — LIPID PANEL
Cholesterol: 123 mg/dL (ref 0–200)
HDL: 27 mg/dL — ABNORMAL LOW (ref >40)
LDL Cholesterol: 73 mg/dL (ref 0–99)
Total CHOL/HDL Ratio: 4.6 RATIO
Triglycerides: 117 mg/dL (ref <150)
VLDL: 23 mg/dL (ref 0–40)

## 2023-08-22 LAB — COOXEMETRY PANEL
Carboxyhemoglobin: 1.2 % (ref 0.5–1.5)
Methemoglobin: <0.7 % (ref 0.0–1.5)
O2 Saturation: 58.5 %
Total hemoglobin: 11.2 g/dL — ABNORMAL LOW (ref 12.0–16.0)

## 2023-08-22 LAB — GLUCOSE, CAPILLARY
Glucose-Capillary: 103 mg/dL — ABNORMAL HIGH (ref 70–99)
Glucose-Capillary: 109 mg/dL — ABNORMAL HIGH (ref 70–99)
Glucose-Capillary: 158 mg/dL — ABNORMAL HIGH (ref 70–99)
Glucose-Capillary: 175 mg/dL — ABNORMAL HIGH (ref 70–99)
Glucose-Capillary: 190 mg/dL — ABNORMAL HIGH (ref 70–99)
Glucose-Capillary: 253 mg/dL — ABNORMAL HIGH (ref 70–99)
Glucose-Capillary: 265 mg/dL — ABNORMAL HIGH (ref 70–99)
Glucose-Capillary: 293 mg/dL — ABNORMAL HIGH (ref 70–99)

## 2023-08-22 LAB — POTASSIUM
Potassium: 3.8 mmol/L (ref 3.5–5.1)
Potassium: 3.9 mmol/L (ref 3.5–5.1)
Potassium: 4.1 mmol/L (ref 3.5–5.1)
Potassium: 4.1 mmol/L (ref 3.5–5.1)
Potassium: 4.3 mmol/L (ref 3.5–5.1)

## 2023-08-22 LAB — MAGNESIUM: Magnesium: 2.7 mg/dL — ABNORMAL HIGH (ref 1.7–2.4)

## 2023-08-22 MED ORDER — INSULIN GLARGINE-YFGN 100 UNIT/ML ~~LOC~~ SOLN
5.0000 [IU] | Freq: Every day | SUBCUTANEOUS | Status: DC
  Administered 2023-08-22 – 2023-08-24 (×3): 5 [IU] via SUBCUTANEOUS
  Filled 2023-08-22 (×3): qty 0.05

## 2023-08-22 MED FILL — Fentanyl Citrate Preservative Free (PF) Inj 100 MCG/2ML: INTRAMUSCULAR | Qty: 2 | Status: AC

## 2023-08-22 MED FILL — Midazolam HCl Inj 2 MG/2ML (Base Equivalent): INTRAMUSCULAR | Qty: 2 | Status: AC

## 2023-08-22 NOTE — TOC Progression Note (Signed)
Transition of Care (TOC) - Progression Note    Patient Details  Name: Antonio [name]. MRN: [id number] Date of Birth: 1956/05/13  Transition of Care [facility]) CM/SW Contact  Nicanor Bake Phone Number: [phone] 08/22/2023, 1:18 PM  Clinical Narrative:   HF CSW scheduled pts follow up hospital appointment scheduled for Wednesday, September 18, 2023 at 1:15 PM at [facility].   TOC will continue following.     Expected Discharge Plan: Home w Home Health Services Barriers to Discharge: Continued Medical Work up  Expected Discharge Plan and Services   Discharge Planning Services: CM Consult   Living arrangements for the past 2 months: Single Family Home                                       Social Determinants of Health (SDOH) Interventions SDOH Screenings   Food Insecurity: No Food Insecurity (08/22/2023)  Housing: Low Risk  (08/22/2023)  Transportation Needs: No Transportation Needs (08/22/2023)  Utilities: Not At Risk (08/22/2023)  Tobacco Use: High Risk (08/22/2023)    Readmission Risk Interventions    01/09/2022   11:29 AM  Readmission Risk Prevention Plan  Transportation Screening Complete  PCP or Specialist Appt within 5-7 Days --  Home Care Screening Complete  Medication Review (RN CM) Complete

## 2023-08-22 NOTE — Progress Notes (Signed)
Heart Failure Navigator Progress Note  Assessed for Heart & Vascular TOC clinic readiness.  Advanced heart failure team has been consulted. Will sign off.   Sharen Hones, PharmD, BCPS Heart Failure Stewardship Pharmacist Phone [phone]

## 2023-08-22 NOTE — Progress Notes (Signed)
Daily Progress Note   Patient Name: Antonio [name].       Date: 08/22/2023 DOB: 06-14-1956  Age: 67 y.o. MRN#: [id number] Attending Physician: Lorin Glass, MD Primary Care Physician: Erskine Emery, NP Admit Date: 08/24/2023  Reason for Consultation/Follow-up: Establishing goals of care  Subjective: No complaints this morning, joking with staff. Son Antonio [name] at bedside.   Length of Stay: 7  Current Medications: Scheduled Meds:   acetaminophen  650 mg Oral Q6H WA   arformoterol  15 mcg Nebulization BID   ascorbic acid  250 mg Oral BID   aspirin  81 mg Oral Daily   atorvastatin  40 mg Oral Daily   budesonide (PULMICORT) nebulizer solution  0.25 mg Nebulization BID   Chlorhexidine Gluconate Cloth  6 each Topical Q0600   famotidine  20 mg Oral Daily   feeding supplement  237 mL Oral TID BM   gabapentin  200 mg Oral QHS   heparin injection (subcutaneous)  5,000 Units Subcutaneous Q8H   insulin aspart  0-9 Units Subcutaneous Q4H   insulin glargine-yfgn  5 Units Subcutaneous Daily   methocarbamol  500 mg Oral TID   multivitamin  1 tablet Oral BID   mupirocin ointment  1 Application Nasal BID   mouth rinse  15 mL Mouth Rinse TID   revefenacin  175 mcg Nebulization Daily   sodium chloride flush  3 mL Intravenous Q12H   sodium chloride flush  3 mL Intravenous Q12H    Continuous Infusions:   prismasol BGK 4/2.5 400 mL/hr at 08/22/23 1246    prismasol BGK 4/2.5 400 mL/hr at 08/22/23 1251   sodium chloride     sodium chloride 10 mL/hr at 08/16/2023 0300   sodium chloride     norepinephrine (LEVOPHED) Adult infusion Stopped (08/09/2023 1055)   prismasol BGK 2/2.5 dialysis solution 1,500 mL/hr at 08/22/23 1102    PRN Meds: sodium chloride, sodium chloride, acetaminophen, docusate, heparin,  hydrALAZINE, HYDROmorphone (DILAUDID) injection, ondansetron (ZOFRAN) IV, mouth rinse, oxyCODONE, polyethylene glycol, sodium chloride flush, sodium chloride flush  Physical Exam Constitutional:      General: He is not in acute distress.    Appearance: He is ill-appearing.  Pulmonary:     Effort: Pulmonary effort is normal.  Skin:    Comments: LLE purple appearing this AM  Neurological:     Mental Status: He is alert and oriented to person, place, and time.             Vital Signs: BP (!) 135/113   Pulse 90   Temp 98.4 F (36.9 C) (Oral)   Resp 12   Ht 6' (1.829 m)   Wt 93.6 kg   SpO2 94%   BMI 27.99 kg/m  SpO2: SpO2: 94 % O2 Device: O2 Device: Nasal Cannula O2 Flow Rate: O2 Flow Rate (L/min): 3 L/min  Intake/output summary:  Intake/Output Summary (Last 24 hours) at 08/22/2023 1252 Last data filed at 08/22/2023 1200 Gross per 24 hour  Intake 390 ml  Output 2653.3 ml  Net -2263.3 ml   LBM: Last BM Date : 08/06/2023 Baseline Weight: Weight: 102.9 kg Most recent weight: Weight: 93.6 kg  Palliative Assessment/Data: PPS 40%      Patient Active Problem List   Diagnosis Date Noted   Malnutrition of moderate degree 08/22/2023   HFrEF (heart failure with reduced ejection fraction) (HCC) 08/22/2023   PAH (pulmonary artery hypertension) (HCC) 08/22/2023   Acute renal failure (HCC) 08/22/2023   Goals of care, counseling/discussion 08/22/2023   Cardiac arrest (HCC) 08/10/2023   Heart block AV complete (HCC) 07/31/2023   Complete heart block (HCC) 08/23/2023   Acute heart failure with preserved ejection fraction (HFpEF) (HCC)    Wound dehiscence 02/03/2022   AKI (acute kidney injury) (HCC)    Benign essential HTN    Uncontrolled type 2 diabetes mellitus with hyperglycemia (HCC)    S/P transmetatarsal amputation of foot, 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/09/2022   Preoperative cardiovascular examination    PVD (peripheral vascular disease) (HCC)    Osteomyelitis (HCC) 12/28/2021    Airway trauma    Acute respiratory failure (HCC)    Dysphagia    Status post tracheostomy (HCC)    HCAP (healthcare-associated pneumonia)    Diabetes mellitus type 2 in nonobese (HCC)    Hypertension    S/P BKA (below knee amputation) unilateral, left (HCC)    Tachypnea    Pain    Agitation    Acute blood loss anemia    Phlegm in throat    Fever, unspecified    Tachycardia    Acute on chronic respiratory failure (HCC)    Hypoxemia    Acute pulmonary edema (HCC)    Hypoxia    Tracheostomy status (HCC)    Pneumonia    Influenza A virus present    Pressure injury of skin 12/01/2018   SIRS (systemic inflammatory response syndrome) (HCC) 11/28/2018   Acute respiratory failure with hypoxemia (HCC) 11/28/2018   Influenza A    Hyponatremia    Chest pain 05/02/2018   Hyperlipidemia 05/02/2018   Tobacco abuse 05/02/2018   Coronary artery disease involving native coronary artery of native heart with angina pectoris (HCC) 05/02/2018   S/P transmetatarsal amputation of foot, left (HCC) 04/15/2017   Amputated great toe of right foot (HCC) 04/15/2017   Ulcer of toe of left foot, limited to breakdown of skin (HCC) 04/04/2017   Partial nontraumatic amputation of foot, right (HCC) 04/04/2017   Type 2 diabetes mellitus with hyperglycemia, with long-term current use of insulin (HCC)    Edema    Wound infection after surgery     Palliative Care Assessment & Plan   HPI: 67 y.o. male  admitted on 07/28/2023 with past medical history significant for hypertension, coronary artery disease CHF, PAD status post left BKA and right metatarsal amputation. According to family patient has been seen by her primary care physician in over a year.   In ED suffered PEA cardiac arrest x 2 with ROSC. Admitted to ICU for septic and cardiogenic shock due to UTI and PEA cardiac arrest. Remained in complete heart block after ROSC. Needed transvenous pacing to improve cardiac output. Echo shows LVEF <20% and  severely reduced RV systolic function. Suffered AKI with creatinine of 5 and hyperkalemia, hypervolemia. Failed trial of diuresis with furosemide. CRRT started in ICU.    Required initiation of pressors again today to maintain BP.     Patient and family face treatment option decisions, advanced directive decisions and anticipatory care needs.  Assessment: Received full update from RN. Extensive chart review.  Follow up today with patient. He is in good spirits. He shares of his conversations with providers  this AM. He has changed code status to DNR/DNI. He does request all other interventions continue, he would like to pursue iHD. We discussed unclear if he can tolerate; he understands but would like to try. He tells "I know I'm going to die, but nobody knows when". I asked him if he felt a family meeting would be helpful but he declines. He tells me he needs to explain situation to his daughter but he would like to do this on his own, without medical staff. He knows he can call if he would like [place] to talk to family as well.   Son Antonio [name] at bedside denies any questions or concerns - well informed by team this AM.   Recommendations/Plan: Now DNR/DNI but interested in continuing all other care for the time being, declines family meeting PMT will follow  Care plan was discussed with RN, patient, son  Thank you for allowing the Palliative Medicine Team to assist in the care of this patient.   Total Time 40 minutes Prolonged Time Billed  no   Time spent includes: Detailed review of medical records (labs, imaging, vital signs), medically appropriate exam, discussion with treatment team, counseling and educating patient, family and/or staff, documenting clinical information, medication management and coordination of care.     *Please note that this is a verbal dictation therefore any spelling or grammatical errors are due to the "[employer]" system interpretation.  Gerlean Ren, DNP,  [facility] Palliative Medicine Team Team Phone # [phone]  Pager [phone]

## 2023-08-22 NOTE — Progress Notes (Signed)
PT Cancellation Note  Patient Details Name: Antonio [name]. MRN: [id number] DOB: 04-16-1956   Cancelled Treatment:    Reason Eval/Treat Not Completed: Patient not medically ready;Patient declined, no reason specified.  Pt's L LE too painful to participate per pt/RN.  Will try back 9/27 as able/appropriate. 08/22/2023  Jacinto Halim., PT [facility] [phone]  (office)    Eliseo Gum Destiney Sanabia 08/22/2023, 4:08 PM

## 2023-08-22 NOTE — Progress Notes (Signed)
Dr Lenell Antu with Vascular paged for absent L femoral pulse on doppler. Pt is poor candidate for intervention at this time per MD. No new orders at this time.

## 2023-08-22 NOTE — Progress Notes (Signed)
[facility] Resident Progress Note  HPI 67 year old with ischemic cardiomyopathy and extensive atherosclerotic vascular presented with dysuria and rapidly deteriorated into PEA cardiac arrest x 2 with ROSC.  Hospital day 7, now with LVEF <20% and reduced RV function.  AKI with oliguria, failed trial of furosemide now on CRRT.  Feeling okay today.  Breathing feels comfortable.  Discussed goals of care with cardiology present.  This person has opted for DNR status.  That said, would like to prolong life with intermittent HD if heart is strong enough for it.  Objective: BP (!) 152/93 (BP Location: Left Arm)   Pulse 84   Temp 98.5 F (36.9 C) (Oral)   Resp (!) 21   Ht 6' (1.829 m)   Wt 93.6 kg   SpO2 96%   BMI 27.99 kg/m   No distress Heart rate and rhythm normal, strong radial pulse, frankly ischemic left BKA stump, warm right TMA stump with good capillary refill Breathing comfortably on nasal cannula, coarse crackles anteriorly Alert and oriented Pleasant and congruent affect   Intake/Output Summary (Last 24 hours) at 08/22/2023 1035 Last data filed at 08/22/2023 1000 Gross per 24 hour  Intake 497.22 ml  Output 2594.8 ml  Net -2097.58 ml      Latest Ref Rng & Units 08/22/2023    8:14 AM 08/22/2023    3:43 AM 08/04/2023   11:56 PM  CMP  Glucose 70 - 99 mg/dL  161    BUN 8 - 23 mg/dL  25    Creatinine 0.96 - 1.24 mg/dL  0.45    Sodium 409 - 811 mmol/L  132    Potassium 3.5 - 5.1 mmol/L 4.1  4.1    4.2  4.3   Chloride 98 - 111 mmol/L  103    CO2 22 - 32 mmol/L  23    Calcium 8.9 - 10.3 mg/dL  7.5        Latest Ref Rng & Units 08/12/2023    3:23 PM 07/28/2023    3:19 PM 08/20/2023    2:02 PM  CBC  Hemoglobin 13.0 - 17.0 g/dL 91.4  78.2  95.6   Hematocrit 39.0 - 52.0 % 40.0  41.0  40.0    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  Hemodynamically stable today.  Appears euvolemic.  CRRT running -50 to -100 mL/h.  Poor chance of renal  recovery near to midterm.  Survival will depend on ability to tolerate intermittent HD, which is concordant with this person's goals of care.  Hyperkalemia RA pressure via RHC was 16, fluid removal via CRRT changed to -50 to -100 mL/h overnight.  Appears euvolemic today.  Stable respiratory status.  Consider running net even.  Biventricular heart failure Severely reduced EF.  Hemodynamics may preclude intermittent HD.  Acute inferior MI Query type II MI versus acute occlusive myocardial infarction, favor type II.  Ischemic left BKA stump Worsens with even marginal hypotension.  VVS following.  Hypoxic respiratory failure COPD Stable.  On nasal cannula and nebulized therapy.  Goals of care Would pursue intermittent HD for prolonging life.  However, elects for DNR status today.  Final recommendations pending attending attestation.  Marrianne Mood MD 08/22/2023, 10:41 AM

## 2023-08-22 NOTE — Consult Note (Addendum)
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   Procedure: RIGHT TRANSMETATARSAL AMPUTATION;  Surgeon: Victorino Sparrow, MD;  Location: [facility] OR;  Service: Vascular;  Laterality: Right;    Family History: Family History  Problem Relation Age of Onset   Diabetes Mother    Emphysema Father    CAD Sister 52       Died of [place] after flu    Social History: Social History   Socioeconomic History   Marital status: Divorced    Spouse name: Not on file   Number of children: Not on file   Years of education: Not on file   Highest education level:  Not on file  Occupational History   Not on file  Tobacco Use   Smoking status: Every Day    Current packs/day: 0.00    Average packs/day: 2.0 packs/day for 35.0 years (70.0 ttl pk-yrs)    Types: Cigarettes    Start date: 11/29/1983    Last attempt to quit: 11/28/2018    Years since quitting: 4.7   Smokeless tobacco: Never  Vaping Use   Vaping status: Former  Substance and Sexual Activity   Alcohol use: Yes    Comment: beers occasionally   Drug use: No   Sexual activity: Not Currently  Other Topics Concern   Not on file  Social History Narrative   Lives with mother.  He has three children.     Social Determinants of Health   Financial Resource Strain: Not on file  Food Insecurity: No Food Insecurity (08/22/2023)   Hunger Vital Sign    Worried About Running Out of Food in the Last Year: Never true    Ran Out of Food in the Last Year: Never true  Transportation Needs: No Transportation Needs (08/22/2023)   PRAPARE - Administrator, Civil Service (Medical): No    Lack of Transportation (Non-Medical): No  Physical Activity: Not on file  Stress: Not on file  Social Connections: Not on file    Allergies:  Allergies  Allergen Reactions   Iodinated Contrast Media Hives, Rash and Other (See Comments)    Blisters Staph Blisters / staph   Penicillins Other (See Comments)    UNSPECIFIED REACTION  Note - tolerated ceftriaxone 08/16/23    Objective:    Vital Signs:   Temp:  [98.1 F (36.7 C)-98.9 F (37.2 C)] 98.5 F (36.9 C) (09/26 0723) Pulse Rate:  [0-93] 84 (09/26 0800) Resp:  [7-22] 21 (09/26 0800) BP: (93-152)/(54-93) 152/93 (09/26 0700) SpO2:  [89 %-100 %] 96 % (09/26 0800) Weight:  [93.6 kg] 93.6 kg (09/26 0500) Last BM Date : 07/29/2023  Weight change: Filed Weights   08/20/23 0600 08/03/2023 0515 08/22/23 0500  Weight: 101.5 kg 100.8 kg 93.6 kg    Intake/Output:   Intake/Output Summary (Last 24 hours) at 08/22/2023 0901 Last data filed at 08/22/2023  0800 Gross per 24 hour  Intake 497.22 ml  Output 2430.8 ml  Net -1933.58 ml    Physical Exam  General:  weak appearing.  No respiratory difficulty HEENT: normal Neck: supple. JVD elevated. Carotids 2+ bilat; no bruits. No lymphadenopathy or thyromegaly appreciated. 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   Procedure: RIGHT TRANSMETATARSAL AMPUTATION;  Surgeon: Victorino Sparrow, MD;  Location: [facility] OR;  Service: Vascular;  Laterality: Right;    Family History: Family History  Problem Relation Age of Onset   Diabetes Mother    Emphysema Father    CAD Sister 52       Died of [place] after flu    Social History: Social History   Socioeconomic History   Marital status: Divorced    Spouse name: Not on file   Number of children: Not on file   Years of education: Not on file   Highest education level:  Not on file  Occupational History   Not on file  Tobacco Use   Smoking status: Every Day    Current packs/day: 0.00    Average packs/day: 2.0 packs/day for 35.0 years (70.0 ttl pk-yrs)    Types: Cigarettes    Start date: 11/29/1983    Last attempt to quit: 11/28/2018    Years since quitting: 4.7   Smokeless tobacco: Never  Vaping Use   Vaping status: Former  Substance and Sexual Activity   Alcohol use: Yes    Comment: beers occasionally   Drug use: No   Sexual activity: Not Currently  Other Topics Concern   Not on file  Social History Narrative   Lives with mother.  He has three children.     Social Determinants of Health   Financial Resource Strain: Not on file  Food Insecurity: No Food Insecurity (08/22/2023)   Hunger Vital Sign    Worried About Running Out of Food in the Last Year: Never true    Ran Out of Food in the Last Year: Never true  Transportation Needs: No Transportation Needs (08/22/2023)   PRAPARE - Administrator, Civil Service (Medical): No    Lack of Transportation (Non-Medical): No  Physical Activity: Not on file  Stress: Not on file  Social Connections: Not on file    Allergies:  Allergies  Allergen Reactions   Iodinated Contrast Media Hives, Rash and Other (See Comments)    Blisters Staph Blisters / staph   Penicillins Other (See Comments)    UNSPECIFIED REACTION  Note - tolerated ceftriaxone 08/16/23    Objective:    Vital Signs:   Temp:  [98.1 F (36.7 C)-98.9 F (37.2 C)] 98.5 F (36.9 C) (09/26 0723) Pulse Rate:  [0-93] 84 (09/26 0800) Resp:  [7-22] 21 (09/26 0800) BP: (93-152)/(54-93) 152/93 (09/26 0700) SpO2:  [89 %-100 %] 96 % (09/26 0800) Weight:  [93.6 kg] 93.6 kg (09/26 0500) Last BM Date : 07/29/2023  Weight change: Filed Weights   08/20/23 0600 08/03/2023 0515 08/22/23 0500  Weight: 101.5 kg 100.8 kg 93.6 kg    Intake/Output:   Intake/Output Summary (Last 24 hours) at 08/22/2023 0901 Last data filed at 08/22/2023  0800 Gross per 24 hour  Intake 497.22 ml  Output 2430.8 ml  Net -1933.58 ml    Physical Exam  General:  weak appearing.  No respiratory difficulty HEENT: normal Neck: supple. JVD elevated. Carotids 2+ bilat; no bruits. No lymphadenopathy or thyromegaly appreciated. 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   Procedure: RIGHT TRANSMETATARSAL AMPUTATION;  Surgeon: Victorino Sparrow, MD;  Location: [facility] OR;  Service: Vascular;  Laterality: Right;    Family History: Family History  Problem Relation Age of Onset   Diabetes Mother    Emphysema Father    CAD Sister 52       Died of [place] after flu    Social History: Social History   Socioeconomic History   Marital status: Divorced    Spouse name: Not on file   Number of children: Not on file   Years of education: Not on file   Highest education level:  Not on file  Occupational History   Not on file  Tobacco Use   Smoking status: Every Day    Current packs/day: 0.00    Average packs/day: 2.0 packs/day for 35.0 years (70.0 ttl pk-yrs)    Types: Cigarettes    Start date: 11/29/1983    Last attempt to quit: 11/28/2018    Years since quitting: 4.7   Smokeless tobacco: Never  Vaping Use   Vaping status: Former  Substance and Sexual Activity   Alcohol use: Yes    Comment: beers occasionally   Drug use: No   Sexual activity: Not Currently  Other Topics Concern   Not on file  Social History Narrative   Lives with mother.  He has three children.     Social Determinants of Health   Financial Resource Strain: Not on file  Food Insecurity: No Food Insecurity (08/22/2023)   Hunger Vital Sign    Worried About Running Out of Food in the Last Year: Never true    Ran Out of Food in the Last Year: Never true  Transportation Needs: No Transportation Needs (08/22/2023)   PRAPARE - Administrator, Civil Service (Medical): No    Lack of Transportation (Non-Medical): No  Physical Activity: Not on file  Stress: Not on file  Social Connections: Not on file    Allergies:  Allergies  Allergen Reactions   Iodinated Contrast Media Hives, Rash and Other (See Comments)    Blisters Staph Blisters / staph   Penicillins Other (See Comments)    UNSPECIFIED REACTION  Note - tolerated ceftriaxone 08/16/23    Objective:    Vital Signs:   Temp:  [98.1 F (36.7 C)-98.9 F (37.2 C)] 98.5 F (36.9 C) (09/26 0723) Pulse Rate:  [0-93] 84 (09/26 0800) Resp:  [7-22] 21 (09/26 0800) BP: (93-152)/(54-93) 152/93 (09/26 0700) SpO2:  [89 %-100 %] 96 % (09/26 0800) Weight:  [93.6 kg] 93.6 kg (09/26 0500) Last BM Date : 07/29/2023  Weight change: Filed Weights   08/20/23 0600 08/03/2023 0515 08/22/23 0500  Weight: 101.5 kg 100.8 kg 93.6 kg    Intake/Output:   Intake/Output Summary (Last 24 hours) at 08/22/2023 0901 Last data filed at 08/22/2023  0800 Gross per 24 hour  Intake 497.22 ml  Output 2430.8 ml  Net -1933.58 ml    Physical Exam  General:  weak appearing.  No respiratory difficulty HEENT: normal Neck: supple. JVD elevated. Carotids 2+ bilat; no bruits. No lymphadenopathy or thyromegaly appreciated. 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   Procedure: RIGHT TRANSMETATARSAL AMPUTATION;  Surgeon: Victorino Sparrow, MD;  Location: [facility] OR;  Service: Vascular;  Laterality: Right;    Family History: Family History  Problem Relation Age of Onset   Diabetes Mother    Emphysema Father    CAD Sister 52       Died of [place] after flu    Social History: Social History   Socioeconomic History   Marital status: Divorced    Spouse name: Not on file   Number of children: Not on file   Years of education: Not on file   Highest education level:  Not on file  Occupational History   Not on file  Tobacco Use   Smoking status: Every Day    Current packs/day: 0.00    Average packs/day: 2.0 packs/day for 35.0 years (70.0 ttl pk-yrs)    Types: Cigarettes    Start date: 11/29/1983    Last attempt to quit: 11/28/2018    Years since quitting: 4.7   Smokeless tobacco: Never  Vaping Use   Vaping status: Former  Substance and Sexual Activity   Alcohol use: Yes    Comment: beers occasionally   Drug use: No   Sexual activity: Not Currently  Other Topics Concern   Not on file  Social History Narrative   Lives with mother.  He has three children.     Social Determinants of Health   Financial Resource Strain: Not on file  Food Insecurity: No Food Insecurity (08/22/2023)   Hunger Vital Sign    Worried About Running Out of Food in the Last Year: Never true    Ran Out of Food in the Last Year: Never true  Transportation Needs: No Transportation Needs (08/22/2023)   PRAPARE - Administrator, Civil Service (Medical): No    Lack of Transportation (Non-Medical): No  Physical Activity: Not on file  Stress: Not on file  Social Connections: Not on file    Allergies:  Allergies  Allergen Reactions   Iodinated Contrast Media Hives, Rash and Other (See Comments)    Blisters Staph Blisters / staph   Penicillins Other (See Comments)    UNSPECIFIED REACTION  Note - tolerated ceftriaxone 08/16/23    Objective:    Vital Signs:   Temp:  [98.1 F (36.7 C)-98.9 F (37.2 C)] 98.5 F (36.9 C) (09/26 0723) Pulse Rate:  [0-93] 84 (09/26 0800) Resp:  [7-22] 21 (09/26 0800) BP: (93-152)/(54-93) 152/93 (09/26 0700) SpO2:  [89 %-100 %] 96 % (09/26 0800) Weight:  [93.6 kg] 93.6 kg (09/26 0500) Last BM Date : 07/29/2023  Weight change: Filed Weights   08/20/23 0600 08/03/2023 0515 08/22/23 0500  Weight: 101.5 kg 100.8 kg 93.6 kg    Intake/Output:   Intake/Output Summary (Last 24 hours) at 08/22/2023 0901 Last data filed at 08/22/2023  0800 Gross per 24 hour  Intake 497.22 ml  Output 2430.8 ml  Net -1933.58 ml    Physical Exam  General:  weak appearing.  No respiratory difficulty HEENT: normal Neck: supple. JVD elevated. Carotids 2+ bilat; no bruits. No lymphadenopathy or thyromegaly appreciated. 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   Procedure: RIGHT TRANSMETATARSAL AMPUTATION;  Surgeon: Victorino Sparrow, MD;  Location: [facility] OR;  Service: Vascular;  Laterality: Right;    Family History: Family History  Problem Relation Age of Onset   Diabetes Mother    Emphysema Father    CAD Sister 52       Died of [place] after flu    Social History: Social History   Socioeconomic History   Marital status: Divorced    Spouse name: Not on file   Number of children: Not on file   Years of education: Not on file   Highest education level:  Not on file  Occupational History   Not on file  Tobacco Use   Smoking status: Every Day    Current packs/day: 0.00    Average packs/day: 2.0 packs/day for 35.0 years (70.0 ttl pk-yrs)    Types: Cigarettes    Start date: 11/29/1983    Last attempt to quit: 11/28/2018    Years since quitting: 4.7   Smokeless tobacco: Never  Vaping Use   Vaping status: Former  Substance and Sexual Activity   Alcohol use: Yes    Comment: beers occasionally   Drug use: No   Sexual activity: Not Currently  Other Topics Concern   Not on file  Social History Narrative   Lives with mother.  He has three children.     Social Determinants of Health   Financial Resource Strain: Not on file  Food Insecurity: No Food Insecurity (08/22/2023)   Hunger Vital Sign    Worried About Running Out of Food in the Last Year: Never true    Ran Out of Food in the Last Year: Never true  Transportation Needs: No Transportation Needs (08/22/2023)   PRAPARE - Administrator, Civil Service (Medical): No    Lack of Transportation (Non-Medical): No  Physical Activity: Not on file  Stress: Not on file  Social Connections: Not on file    Allergies:  Allergies  Allergen Reactions   Iodinated Contrast Media Hives, Rash and Other (See Comments)    Blisters Staph Blisters / staph   Penicillins Other (See Comments)    UNSPECIFIED REACTION  Note - tolerated ceftriaxone 08/16/23    Objective:    Vital Signs:   Temp:  [98.1 F (36.7 C)-98.9 F (37.2 C)] 98.5 F (36.9 C) (09/26 0723) Pulse Rate:  [0-93] 84 (09/26 0800) Resp:  [7-22] 21 (09/26 0800) BP: (93-152)/(54-93) 152/93 (09/26 0700) SpO2:  [89 %-100 %] 96 % (09/26 0800) Weight:  [93.6 kg] 93.6 kg (09/26 0500) Last BM Date : 08/09/2023  Weight change: Filed Weights   08/20/23 0600 08/18/2023 0515 08/22/23 0500  Weight: 101.5 kg 100.8 kg 93.6 kg    Intake/Output:   Intake/Output Summary (Last 24 hours) at 08/22/2023 0901 Last data filed at 08/22/2023  0800 Gross per 24 hour  Intake 497.22 ml  Output 2430.8 ml  Net -1933.58 ml    Physical Exam  General:  weak appearing.  No respiratory difficulty HEENT: normal Neck: supple. JVD elevated. Carotids 2+ bilat; no bruits. No lymphadenopathy or thyromegaly appreciated. 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   Procedure: RIGHT TRANSMETATARSAL AMPUTATION;  Surgeon: Victorino Sparrow, MD;  Location: [facility] OR;  Service: Vascular;  Laterality: Right;    Family History: Family History  Problem Relation Age of Onset   Diabetes Mother    Emphysema Father    CAD Sister 52       Died of [place] after flu    Social History: Social History   Socioeconomic History   Marital status: Divorced    Spouse name: Not on file   Number of children: Not on file   Years of education: Not on file   Highest education level:  Not on file  Occupational History   Not on file  Tobacco Use   Smoking status: Every Day    Current packs/day: 0.00    Average packs/day: 2.0 packs/day for 35.0 years (70.0 ttl pk-yrs)    Types: Cigarettes    Start date: 11/29/1983    Last attempt to quit: 11/28/2018    Years since quitting: 4.7   Smokeless tobacco: Never  Vaping Use   Vaping status: Former  Substance and Sexual Activity   Alcohol use: Yes    Comment: beers occasionally   Drug use: No   Sexual activity: Not Currently  Other Topics Concern   Not on file  Social History Narrative   Lives with mother.  He has three children.     Social Determinants of Health   Financial Resource Strain: Not on file  Food Insecurity: No Food Insecurity (08/22/2023)   Hunger Vital Sign    Worried About Running Out of Food in the Last Year: Never true    Ran Out of Food in the Last Year: Never true  Transportation Needs: No Transportation Needs (08/22/2023)   PRAPARE - Administrator, Civil Service (Medical): No    Lack of Transportation (Non-Medical): No  Physical Activity: Not on file  Stress: Not on file  Social Connections: Not on file    Allergies:  Allergies  Allergen Reactions   Iodinated Contrast Media Hives, Rash and Other (See Comments)    Blisters Staph Blisters / staph   Penicillins Other (See Comments)    UNSPECIFIED REACTION  Note - tolerated ceftriaxone 08/16/23    Objective:    Vital Signs:   Temp:  [98.1 F (36.7 C)-98.9 F (37.2 C)] 98.5 F (36.9 C) (09/26 0723) Pulse Rate:  [0-93] 84 (09/26 0800) Resp:  [7-22] 21 (09/26 0800) BP: (93-152)/(54-93) 152/93 (09/26 0700) SpO2:  [89 %-100 %] 96 % (09/26 0800) Weight:  [93.6 kg] 93.6 kg (09/26 0500) Last BM Date : 08/09/2023  Weight change: Filed Weights   08/20/23 0600 08/18/2023 0515 08/22/23 0500  Weight: 101.5 kg 100.8 kg 93.6 kg    Intake/Output:   Intake/Output Summary (Last 24 hours) at 08/22/2023 0901 Last data filed at 08/22/2023  0800 Gross per 24 hour  Intake 497.22 ml  Output 2430.8 ml  Net -1933.58 ml    Physical Exam  General:  weak appearing.  No respiratory difficulty HEENT: normal Neck: supple. JVD elevated. Carotids 2+ bilat; no bruits. No lymphadenopathy or thyromegaly appreciated. 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   Procedure: RIGHT TRANSMETATARSAL AMPUTATION;  Surgeon: Victorino Sparrow, MD;  Location: [facility] OR;  Service: Vascular;  Laterality: Right;    Family History: Family History  Problem Relation Age of Onset   Diabetes Mother    Emphysema Father    CAD Sister 52       Died of [place] after flu    Social History: Social History   Socioeconomic History   Marital status: Divorced    Spouse name: Not on file   Number of children: Not on file   Years of education: Not on file   Highest education level:  Not on file  Occupational History   Not on file  Tobacco Use   Smoking status: Every Day    Current packs/day: 0.00    Average packs/day: 2.0 packs/day for 35.0 years (70.0 ttl pk-yrs)    Types: Cigarettes    Start date: 11/29/1983    Last attempt to quit: 11/28/2018    Years since quitting: 4.7   Smokeless tobacco: Never  Vaping Use   Vaping status: Former  Substance and Sexual Activity   Alcohol use: Yes    Comment: beers occasionally   Drug use: No   Sexual activity: Not Currently  Other Topics Concern   Not on file  Social History Narrative   Lives with mother.  He has three children.     Social Determinants of Health   Financial Resource Strain: Not on file  Food Insecurity: No Food Insecurity (08/22/2023)   Hunger Vital Sign    Worried About Running Out of Food in the Last Year: Never true    Ran Out of Food in the Last Year: Never true  Transportation Needs: No Transportation Needs (08/22/2023)   PRAPARE - Administrator, Civil Service (Medical): No    Lack of Transportation (Non-Medical): No  Physical Activity: Not on file  Stress: Not on file  Social Connections: Not on file    Allergies:  Allergies  Allergen Reactions   Iodinated Contrast Media Hives, Rash and Other (See Comments)    Blisters Staph Blisters / staph   Penicillins Other (See Comments)    UNSPECIFIED REACTION  Note - tolerated ceftriaxone 08/16/23    Objective:    Vital Signs:   Temp:  [98.1 F (36.7 C)-98.9 F (37.2 C)] 98.5 F (36.9 C) (09/26 0723) Pulse Rate:  [0-93] 84 (09/26 0800) Resp:  [7-22] 21 (09/26 0800) BP: (93-152)/(54-93) 152/93 (09/26 0700) SpO2:  [89 %-100 %] 96 % (09/26 0800) Weight:  [93.6 kg] 93.6 kg (09/26 0500) Last BM Date : 08/09/2023  Weight change: Filed Weights   08/20/23 0600 08/18/2023 0515 08/22/23 0500  Weight: 101.5 kg 100.8 kg 93.6 kg    Intake/Output:   Intake/Output Summary (Last 24 hours) at 08/22/2023 0901 Last data filed at 08/22/2023  0800 Gross per 24 hour  Intake 497.22 ml  Output 2430.8 ml  Net -1933.58 ml    Physical Exam  General:  weak appearing.  No respiratory difficulty HEENT: normal Neck: supple. JVD elevated. Carotids 2+ bilat; no bruits. No lymphadenopathy or thyromegaly appreciated. RIJ CVC Cor: PMI

## 2023-08-22 NOTE — Progress Notes (Signed)
NAME:  Antonio [name]., MRN:  [id number], DOB:  02-05-1956, LOS: 7 ADMISSION DATE:  07/28/2023, CONSULTATION DATE:  9/19 REFERRING MD:  Dr. Criss Alvine, CHIEF COMPLAINT:  cardiac arrest   History of Present Illness:  Patient is a 67 yo M w/ pertinent PMH of HTN, CAD s/p stent placement, PAD s/p L BKA and LSFA stenting 2013, T2DM, right foot osteomyelitis s/p right iliofemoral endarterectomy and right metatarsal amputation presents to [facility] ED on 9/19 w/ UTI.  Patient having UTI symptoms for the past 4-5 days. Also having b/l back pain and abd pain. Also having chronic right leg pain that has been progressively worsening over the last 4-5 months. On arrival patient alert and hypertensive 162/120. Afebrile and wbc 7.8. UA w/ leukocytes and uc pending. Started on rocephin. Patient also w/ creat 2.32. Patient taken to vascular lab and dvt US with no evidence of dvt and arterial duplex showing 75-99% stenosis in deep femoral artery and 50-74% stenosis in superficial femoral artery recommending vascular consult.   While in ED patient found became unresponsive w/ clinched teeth. Patient was pulseless and cpr started w/ rosc in 2 minutes. Attempted to intubated after code and very difficult airway and initially could not pass ETT. Patient coded during intubation procedure. 6.5 ETT was able to be placed and ROSC was achieved. Patient on levo and epi. Patient hr 50s. EKG showing complete AV block. Cards consulted and taking for emergent cath. PCCM consulted for icu admission.  Pertinent  Medical History   Past Medical History:  Diagnosis Date   Coronary artery disease    Diabetes mellitus without complication (HCC)    typ2   GERD (gastroesophageal reflux disease)    Hyperlipidemia    Hypertension    Osteomyelitis (HCC) 03/25/2017   RT FOOT    Significant Hospital Events: Including procedures, antibiotic start and stop dates in addition to other pertinent events   9/19 admitted initially w/ UTI;  while in ED pea arrest, difficult intubation. Complete heart block going to cath lab 9/21 extubated 9/22 CRRT start 9/25 high degree AV block self-resolved. R/LHC 3vdz pHTN HFrEF low CO  9/26 starting GOC conversations. Consulting Adv   Interim History / Subjective:  R/LHC yesterday   Objective   Blood pressure (!) 152/93, pulse 84, temperature 98.5 F (36.9 C), temperature source Oral, resp. rate (!) 21, height 6' (1.829 m), weight 93.6 kg, SpO2 96%.        Intake/Output Summary (Last 24 hours) at 08/22/2023 1009 Last data filed at 08/22/2023 1000 Gross per 24 hour  Intake 497.22 ml  Output 2594.8 ml  Net -2097.58 ml   Filed Weights   08/20/23 0600 07/29/2023 0515 08/22/23 0500  Weight: 101.5 kg 100.8 kg 93.6 kg    Examination: Gen: chronically and acutely ill M NAD HEENT: NCAT  Pulm: even unlabored. Diminished bases CV: cap refill < 3 sec upper extremities GI soft ndnt  GU: anuric  MSK: L BKA with ischemic changes. R TMA  Skin: pale, cool  Psych: fairly poor insight into dz   Resolved Hospital Problem list   HTNsive urgency- resolved with fluid removal Acute respiratory failure w/ hypoxia and hypercapnia- significant emphysema noted on CT; extubated 9/22, intermittent BIPAP need likely now due to volume, significantly improved Metabolic encephalopathy- improved/resolved CPR- induced rib fractures Question CAP, UTI- completing course of abx  Assessment & Plan:    Inferior MI 3vCAD  HFrEF, RV dysfunction  pHTN PEA arrest High degree AV block -[facility] 9/25  with severe 3vCAD -- occluded pRCA, pLAD heavily calcified, Lcx w significant dz distal to existing stent which was patent. Moderately elevated RA pressures-- moderate pHTN, severely reduced CO.  Severely reduced LVEF  P -cards rec medical mgmnt, optimize HF  -will consult HF though not sure there will be much to add -volume management via CRRT right now   Difficult airway, presumed subglottic stenosis -prior  trach  P -noted   AKI Hyponatremia  Hypermagnesemia  P -CRRT per nephro. Hemodynamically might be nearing a point to talk about iHD but think we need to consider overall GOC   -renal recovery is not expected   PAD S/p R TMA, L BKA  P -VVS following  -if aggressive care were continued would need L AKA   HLD -statin   DM2 with hyperglycemia  -SSI  GOC: -a lot of complex medical problems going on. Palliative was engaged earlier this admission and at that time pt wanted all aggressive measures. As we cont to learn more about his underlying dz process & course of his critical illness, I think we need to readdress GOC.  Started these convos w pt and his son 9/26. Pt is worried about how to talk w his daughter  Best Practice (right click and "Reselect all SmartList Selections" daily)   Diet/type: heart healthy DVT prophylaxis: heparin dvt ppx dose GI prophylaxis: H2B Lines: HD catheter Foley:  yes Code Status:  full code Last date of multidisciplinary goals of care discussion [updated son 9/24]  CRITICAL CARE Performed by: Lanier Clam   Total critical care time: 53 minutes  Critical care time was exclusive of separately billable procedures and treating other patients. Critical care was necessary to treat or prevent imminent or life-threatening deterioration.  Critical care was time spent personally by me on the following activities: development of treatment plan with patient and/or surrogate as well as nursing, discussions with consultants, evaluation of patient's response to treatment, examination of patient, obtaining history from patient or surrogate, ordering and performing treatments and interventions, ordering and review of laboratory studies, ordering and review of radiographic studies, pulse oximetry and re-evaluation of patient's condition.  Tessie Fass MSN, AGACNP-BC [facility] Pulmonary/Critical Care Medicine Amion for pager  08/22/2023, 10:09 AM

## 2023-08-22 NOTE — Progress Notes (Signed)
Patient Name: Caryl Ferdinand [facility]. Date of Encounter: 08/22/2023  [facility] Cardiologist: Rollene Rotunda, MD   Interval Summary  .    Biggest complaint remains leg pain, especially on the left. Remains essentially anuric. Cardiac catheterization yesterday showed total occlusion of the right coronary artery not amenable to revascularization.  There was also a moderate to severe, heavily calcified stenosis in the LAD artery that is not the culprit vessel and would require relatively complex revascularization including atherectomy. Right heart catheterization showed that his left heart filling pressures were almost in target range (PCWP 17), but there was evidence of pulmonary hypertension and right ventricular failure (mean PAP 35 mmHg, PVR 4.25, RA pressure 16 mmHg, PAP only 1.6).  Vital Signs .    Vitals:   08/22/23 0500 08/22/23 0600 08/22/23 0700 08/22/23 0723  BP: (!) 111/54 (!) 141/88 (!) 152/93   Pulse: 75 76 77   Resp: 15 12 (!) 22   Temp:    98.5 F (36.9 C)  TempSrc:    Oral  SpO2: 96% 99% 96%   Weight: 93.6 kg     Height:        Intake/Output Summary (Last 24 hours) at 08/22/2023 0841 Last data filed at 08/22/2023 0800 Gross per 24 hour  Intake 500.96 ml  Output 2430.8 ml  Net -1929.84 ml      08/22/2023    5:00 AM 09/14/2023    5:15 AM 08/20/2023    6:00 AM  Last 3 Weights  Weight (lbs) 206 lb 5.6 oz 222 lb 3.6 oz 223 lb 12.3 oz  Weight (kg) 93.6 kg 100.8 kg 101.5 kg      Telemetry/ECG    Normal sinus rhythm, no further episodes of AV block.- Personally Reviewed  Physical Exam .   GEN: Moderate discomfort.   Neck: No JVD Cardiac: RRR, no murmurs, rubs, or gallops.  Respiratory: Clear to auscultation bilaterally. GI: Soft, nontender, non-distended  MS: No edema.  Cyanotic, cold stump of left BKA  Assessment & Plan .     Shock: Resolved, no longer on pressors.  There is clearly a component of severe right ventricular failure, probably RV  infarction due to the occlusion of the proximal RCA, on a background of chronic cor pulmonale related to COPD. CHF: His wedge pressure is almost in optimal range.  Aggressively removing additional volume would likely lead to drop in cardiac output due to RV failure.  Would recommend trying to keep him even. PAH: His pulmonary gradient is 18, major cause for pulmonary hypertension is chronic cor pulmonale due to COPD, rather than left heart failure. Second-degree AV block: typically this resolves 3-5 days following inferior wall myocardial infarction.  Hopefully will not be a problem anymore. CAD: The culprit vessel was the right coronary artery which cannot be revascularized.  He does have a heavily calcified stenosis in the proximal LAD artery.  This would require complex revascularization including atherectomy.  This is not the culprit vessel.  In fact the anterior wall and anterior septum have relatively preserved contractility. It is not at all clear that revascularizing the LAD artery would lead to any improvement in his current situation, and complications related to complex revascularization could have disastrous outcomes.  ARF: remains essentially anuric.  Low chance of renal function recovery.  He would be a poor candidate for chronic dialysis due to his multiple comorbid problems. PAD: Severe ischemia of the right amputation stump, which is causing him most of his current discomfort.  Note that his CK is elevated, probably reflecting skeletal muscle ischemia.  He is a poor candidate for any surgical intervention due to his current cardiac status.  Overall prognosis is poor.  Palliative care may be the best option.  CRITICAL CARE Performed by: Rachelle Hora Sterling Ucci   Total critical care time: 30 minutes  Critical care time was exclusive of separately billable procedures and treating other patients.  Critical care was necessary to treat or prevent imminent or life-threatening  deterioration.  Critical care was time spent personally by me on the following activities: development of treatment plan with patient and/or surrogate as well as nursing, discussions with consultants, evaluation of patient's response to treatment, examination of patient, obtaining history from patient or surrogate, ordering and performing treatments and interventions, ordering and review of laboratory studies, ordering and review of radiographic studies, pulse oximetry and re-evaluation of patient's condition.   For questions or updates, please contact [facility] Please consult [email or web address] for contact info under        Signed, Thurmon Fair, MD

## 2023-08-22 NOTE — Progress Notes (Addendum)
Progress Note    08/22/2023 8:15 AM 1 Day Post-Op  Subjective:  both of his legs still hurt a lot    Vitals:   08/22/23 0700 08/22/23 0723  BP: (!) 152/93   Pulse: 77   Resp: (!) 22   Temp:  98.5 F (36.9 C)  SpO2: 96%     Physical Exam: General:  sitting up in bed Lungs:  nonlabored Extremities:  L BKA continued ischemia. Right TMA with unchanged wounds, AT doppler signal  CBC    Component Value Date/Time   WBC 10.9 (H) 08/20/2023 0511   RBC 4.91 08/20/2023 0511   HGB 13.6 08/07/2023 1523   HCT 40.0 08/02/2023 1523   PLT 141 (L) 08/20/2023 0511   MCV 84.1 08/20/2023 0511   MCH 24.6 (L) 08/20/2023 0511   MCHC 29.3 (L) 08/20/2023 0511   RDW 15.4 08/20/2023 0511   LYMPHSABS 1.2 Sep 04, 2023 1316   MONOABS 0.6 09/04/23 1316   EOSABS 0.2 September 04, 2023 1316   BASOSABS 0.0 09-04-2023 1316    BMET    Component Value Date/Time   NA 132 (L) 08/22/2023 0343   K 4.1 08/22/2023 0343   K 4.2 08/22/2023 0343   CL 103 08/22/2023 0343   CO2 23 08/22/2023 0343   GLUCOSE 189 (H) 08/22/2023 0343   BUN 25 (H) 08/22/2023 0343   CREATININE 2.00 (H) 08/22/2023 0343   CALCIUM 7.5 (L) 08/22/2023 0343   GFRNONAA 36 (L) 08/22/2023 0343   GFRAA >60 02/15/2019 0455    INR    Component Value Date/Time   INR 1.0 02/03/2022 1947     Intake/Output Summary (Last 24 hours) at 08/22/2023 0815 Last data filed at 08/22/2023 0800 Gross per 24 hour  Intake 500.96 ml  Output 2430.8 ml  Net -1929.84 ml      Assessment/Plan:  67 y.o. male with ischemia left BKA and right TMA chronic wounds  -Continues to have significant pain in BLE, L>R -L BKA continuing to demarcate. Cold and painful to touch -R TMA unchanged with faint AT doppler signal -Per primary, little to no changes of renal recovery. Patient has been agreeable to heart cath -Patient is interested in revising left BKA to an AKA for pain control. Potentially this could be done Monday with Dr.Annessa Satre  Loel Dubonnet,  PA-C [facility] [phone] 08/22/2023 8:15 AM  I have seen and evaluated the patient. I agree with the PA note as documented above.  Worsening chronic ischemic changes to his left BKA stump.  In discussion with critical care they do not expect he will have any renal recovery with conversion to ESRD.  Also underwent cardiac cath yesterday with severe multivessel coronary disease and also has severe heart failure with LVEF less than 20%.  Goals of care discussion started this morning by critical care.  Discussed with patient that if he elects full-court press we can convert his left BKA to an AKA next week.  Some risk this does not heal as I do not feel a good femoral pulse on the left.  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  Cephus Shelling, MD [facility] [place] Office: [phone]

## 2023-08-22 NOTE — Plan of Care (Signed)
  Problem: Cardiovascular: Goal: Vascular access site(s) Level 0-1 will be maintained Outcome: Progressing   Problem: Clinical Measurements: Goal: Respiratory complications will improve Outcome: Progressing   Problem: Clinical Measurements: Goal: Diagnostic test results will improve Outcome: Progressing   Problem: Nutrition: Goal: Adequate nutrition will be maintained Outcome: Progressing

## 2023-08-23 DIAGNOSIS — Z7189 Other specified counseling: Secondary | ICD-10-CM | POA: Diagnosis not present

## 2023-08-23 DIAGNOSIS — R570 Cardiogenic shock: Secondary | ICD-10-CM | POA: Diagnosis not present

## 2023-08-23 DIAGNOSIS — I5023 Acute on chronic systolic (congestive) heart failure: Secondary | ICD-10-CM | POA: Diagnosis not present

## 2023-08-23 DIAGNOSIS — I272 Pulmonary hypertension, unspecified: Secondary | ICD-10-CM

## 2023-08-23 DIAGNOSIS — I998 Other disorder of circulatory system: Secondary | ICD-10-CM

## 2023-08-23 DIAGNOSIS — I5021 Acute systolic (congestive) heart failure: Secondary | ICD-10-CM | POA: Diagnosis not present

## 2023-08-23 DIAGNOSIS — I5082 Biventricular heart failure: Secondary | ICD-10-CM | POA: Diagnosis not present

## 2023-08-23 DIAGNOSIS — I442 Atrioventricular block, complete: Secondary | ICD-10-CM | POA: Diagnosis not present

## 2023-08-23 DIAGNOSIS — Z515 Encounter for palliative care: Secondary | ICD-10-CM | POA: Diagnosis not present

## 2023-08-23 DIAGNOSIS — I469 Cardiac arrest, cause unspecified: Secondary | ICD-10-CM | POA: Diagnosis not present

## 2023-08-23 LAB — TRIGLYCERIDES: Triglycerides: 106 mg/dL (ref <150)

## 2023-08-23 LAB — RENAL FUNCTION PANEL
Albumin: 2.5 g/dL — ABNORMAL LOW (ref 3.5–5.0)
Albumin: 2.3 g/dL — ABNORMAL LOW (ref 3.5–5.0)
Anion gap: 6 (ref 5–15)
Anion gap: 7 (ref 5–15)
BUN: 22 mg/dL (ref 8–23)
BUN: 27 mg/dL — ABNORMAL HIGH (ref 8–23)
CO2: 25 mmol/L (ref 22–32)
CO2: 25 mmol/L (ref 22–32)
Calcium: 7.8 mg/dL — ABNORMAL LOW (ref 8.9–10.3)
Calcium: 7.7 mg/dL — ABNORMAL LOW (ref 8.9–10.3)
Chloride: 102 mmol/L (ref 98–111)
Chloride: 102 mmol/L (ref 98–111)
Creatinine, Ser: 2.03 mg/dL — ABNORMAL HIGH (ref 0.61–1.24)
Creatinine, Ser: 2.37 mg/dL — ABNORMAL HIGH (ref 0.61–1.24)
GFR, Estimated: 36 mL/min — ABNORMAL LOW (ref >60)
GFR, Estimated: 30 mL/min — ABNORMAL LOW (ref >60)
Glucose, Bld: 131 mg/dL — ABNORMAL HIGH (ref 70–99)
Glucose, Bld: 156 mg/dL — ABNORMAL HIGH (ref 70–99)
Phosphorus: 2.9 mg/dL (ref 2.5–4.6)
Phosphorus: 2.4 mg/dL — ABNORMAL LOW (ref 2.5–4.6)
Potassium: 4 mmol/L (ref 3.5–5.1)
Potassium: 3.6 mmol/L (ref 3.5–5.1)
Sodium: 133 mmol/L — ABNORMAL LOW (ref 135–145)
Sodium: 134 mmol/L — ABNORMAL LOW (ref 135–145)

## 2023-08-23 LAB — GLUCOSE, CAPILLARY
Glucose-Capillary: 118 mg/dL — ABNORMAL HIGH (ref 70–99)
Glucose-Capillary: 155 mg/dL — ABNORMAL HIGH (ref 70–99)
Glucose-Capillary: 171 mg/dL — ABNORMAL HIGH (ref 70–99)
Glucose-Capillary: 140 mg/dL — ABNORMAL HIGH (ref 70–99)
Glucose-Capillary: 142 mg/dL — ABNORMAL HIGH (ref 70–99)

## 2023-08-23 LAB — MAGNESIUM: Magnesium: 2.6 mg/dL — ABNORMAL HIGH (ref 1.7–2.4)

## 2023-08-23 MED ORDER — DEXMEDETOMIDINE HCL IN NACL 400 MCG/100ML IV SOLN
0.0000 ug/kg/h | INTRAVENOUS | Status: DC
  Administered 2023-08-23: 0.4 ug/kg/h via INTRAVENOUS
  Filled 2023-08-23: qty 100

## 2023-08-23 MED ORDER — ZIPRASIDONE MESYLATE 20 MG IM SOLR
20.0000 mg | Freq: Once | INTRAMUSCULAR | Status: AC
  Administered 2023-08-23: 20 mg via INTRAMUSCULAR
  Filled 2023-08-23: qty 20

## 2023-08-23 MED ORDER — OLANZAPINE 10 MG IM SOLR: 2.5000 mg | Freq: Four times a day (QID) | INTRAMUSCULAR | Status: DC | PRN

## 2023-08-23 MED ORDER — HYDROMORPHONE HCL 1 MG/ML IJ SOLN
0.5000 mg | INTRAMUSCULAR | Status: DC | PRN
  Administered 2023-08-23 (×2): 1 mg via INTRAVENOUS
  Filled 2023-08-23 (×2): qty 1

## 2023-08-23 MED ORDER — ZIPRASIDONE MESYLATE 20 MG IM SOLR
20.0000 mg | Freq: Once | INTRAMUSCULAR | Status: DC
  Filled 2023-08-23: qty 20

## 2023-08-23 MED ORDER — GABAPENTIN 100 MG PO CAPS
200.0000 mg | ORAL_CAPSULE | Freq: Two times a day (BID) | ORAL | Status: DC
  Administered 2023-08-23 – 2023-08-24 (×3): 200 mg via ORAL
  Filled 2023-08-23 (×3): qty 2

## 2023-08-23 NOTE — Progress Notes (Signed)
Physical Therapy Treatment Patient Details Name: Antonio [name]. MRN: [id number] DOB: [birth year] Today's Date: 08/23/2023   History of Present Illness 67 year old male who initially presented with dysuria and chronic right leg pain found to have UTI. While waiting in the ED patient became unresponsive, septic feeling not right, went into PEA cardiac arrest, ROSC was achieved, while intubation it was difficult airway, patient coded again.  Post ROSC patient was noted to have nonsustained polymorphic VT's, underlying rhythm was noted to be complete heart block, cardiology was consulted, transcutaneous pacer was tried but failed went to Cath Lab for transvenous pacing. PMH: HTN, CAD, PAD s/p left BKA and transmet of right toes, diabetes    PT Comments  Pt is praying for surgical amputation.  Agreed to mobilize to EOB, work on sitting tolerance with/without UE assist and completed some sitting exercise while stabilizing at EOB    If plan is discharge home, recommend the following: Two people to help with walking and/or transfers;Two people to help with bathing/dressing/bathroom;Assist for transportation;Help with stairs or ramp for entrance   Can travel by private vehicle     No  Equipment Recommendations  None recommended by PT    Recommendations for Other Services       Precautions / Restrictions Precautions Precautions: Fall Precaution Comments: L BKA, R transmet, CRRT Restrictions Weight Bearing Restrictions: No     Mobility  Bed Mobility Overal bed mobility: Needs Assistance Bed Mobility: Supine to Sit     Supine to sit: Contact guard, Used rails, HOB elevated     General bed mobility comments: pt would not let therapist assist though pt had to compensate by using rails and HOB up.  Pt was able to use UE's somewhat to scoot to EOB    Transfers                        Ambulation/Gait                   Stairs             Wheelchair  Mobility     Tilt Bed    Modified Rankin (Stroke Patients Only)       Balance Overall balance assessment: Needs assistance Sitting-balance support: Feet supported, Feet unsupported, Bilateral upper extremity supported, No upper extremity supported Sitting balance-Leahy Scale: Fair Sitting balance - Comments: able to sit upright EOB for short periods without UE assist, though did fatigue needing UE assist quickly.  Otherwise generally steady at EOB with UE assist while completing LAQ, hip flex and quad sets from EOB       Standing balance comment: pt agree to try standing trial, but gave up trying after 10 min at EOB                            Cognition Arousal: Alert Behavior During Therapy: [facility] for tasks assessed/performed Overall Cognitive Status:  (NT formally)                                          Exercises General Exercises - Lower Extremity Ankle Circles/Pumps: AROM, Right, 10 reps Long Arc Quad: AROM, Strengthening, Right, 10 reps, Supine Heel Slides: Strengthening Hip Flexion/Marching: AROM, Right, 10 reps, Seated Heel Raises: AROM, Right, 10 reps    General Comments General  comments (skin integrity, edema, etc.): BP in upper 80's/upper 60's sitting EOB      Pertinent Vitals/Pain Pain Assessment Pain Assessment: Faces Faces Pain Scale: Hurts whole lot Pain Location: L LE Pain Descriptors / Indicators: Sharp, Sore, Throbbing, Burning Pain Intervention(s): Limited activity within patient's tolerance    Home Living                          Prior Function            PT Goals (current goals can now be found in the care plan section) Acute Rehab PT Goals Time For Goal Achievement: 09/03/23 Potential to Achieve Goals: Good Progress towards PT goals: Progressing toward goals    Frequency    Min 1X/week      PT Plan      Co-evaluation              AM-PAC PT "6 Clicks" Mobility   Outcome Measure   Help needed turning from your back to your side while in a flat bed without using bedrails?: Total Help needed moving from lying on your back to sitting on the side of a flat bed without using bedrails?: Total Help needed moving to and from a bed to a chair (including a wheelchair)?: Total Help needed standing up from a chair using your arms (e.g., wheelchair or bedside chair)?: Total Help needed to walk in hospital room?: Total Help needed climbing 3-5 steps with a railing? : Total 6 Click Score: 6    End of Session   Activity Tolerance: Patient tolerated treatment well Patient left: in bed;with family/visitor present Nurse Communication: Mobility status PT Visit Diagnosis: Muscle weakness (generalized) (M62.81);Difficulty in walking, not elsewhere classified (R26.2)     Time: 4098-1191 PT Time Calculation (min) (ACUTE ONLY): 23 min  Charges:    $Therapeutic Exercise: 8-22 mins $Therapeutic Activity: 8-22 mins PT General Charges $$ ACUTE PT VISIT: 1 Visit                     08/23/2023  Jacinto Halim., PT Acute Rehabilitation Services [phone]  (office)   Eliseo Gum Terry [place] 08/23/2023, 3:23 PM

## 2023-08-23 NOTE — Progress Notes (Addendum)
Advanced Heart Failure Rounding Note  PCP-Cardiologist: Rollene Rotunda, MD   Subjective:    Feels fine this morning, remains on CRRT. Complaining of leg pain.  Objective:   Weight Range: 88.1 kg Body mass index is 26.34 kg/m.   Vital Signs:   Temp:  [97.8 F (36.6 C)-98.4 F (36.9 C)] 98.4 F (36.9 C) (09/27 0351) Pulse Rate:  [78-97] 95 (09/27 0800) Resp:  [9-27] 18 (09/27 0800) BP: (102-160)/(59-113) 148/68 (09/27 0800) SpO2:  [85 %-100 %] 96 % (09/27 0800) Weight:  [88.1 kg] 88.1 kg (09/27 0500) Last BM Date : 08/02/2023  Weight change: Filed Weights   08/18/2023 0515 08/22/23 0500 08/23/23 0500  Weight: 100.8 kg 93.6 kg 88.1 kg   Intake/Output:   Intake/Output Summary (Last 24 hours) at 08/23/2023 0843 Last data filed at 08/23/2023 0800 Gross per 24 hour  Intake 1900 ml  Output 2749.7 ml  Net -849.7 ml    Physical Exam  General:  well appearing.   HEENT: normal Neck: supple. JVD elevated. Carotids 2+ bilat; no bruits. No lymphadenopathy or thyromegaly appreciated. RIJ CVC Cor: PMI nondisplaced. Regular rate & rhythm. No rubs, gallops or murmurs. Lungs: diminished Abdomen: soft, nontender, nondistended. No hepatosplenomegaly. No bruits or masses. Good bowel sounds. Extremities: no cyanosis, clubbing, rash, trace BLE edema. L BKA. Chronic wounds RLE, R TMA GU: +foley Neuro: alert & oriented x 3, cranial nerves grossly intact. moves all 4 extremities w/o difficulty. Affect pleasant.   Telemetry   NSR 90s (Personally reviewed)    EKG    No new EKG today  Labs    CBC Recent Labs    08/23/2023 1519 08/10/2023 1523  HGB 13.9 13.6  HCT 41.0 40.0   Basic Metabolic Panel Recent Labs    72/53/66 0343 08/22/23 0814 08/22/23 1555 08/22/23 2037 08/23/23 0428  NA 132*  --  132*  --  133*  K 4.2  4.1   < > 3.9 3.9 4.0  CL 103  --  100  --  102  CO2 23  --  24  --  25  GLUCOSE 189*  --  268*  --  131*  BUN 25*  --  24*  --  22  CREATININE 2.00*  --   2.18*  --  2.03*  CALCIUM 7.5*  --  7.4*  --  7.8*  MG 2.7*  --   --   --  2.6*  PHOS 3.3  --  2.6  --  2.9   < > = values in this interval not displayed.   Liver Function Tests Recent Labs    08/22/23 1555 08/23/23 0428  ALBUMIN 2.3* 2.5*   No results for input(s): "LIPASE", "AMYLASE" in the last 72 hours. Cardiac Enzymes Recent Labs    08/22/2023 0949  CKTOTAL 3,036*    BNP: BNP (last 3 results) Recent Labs    08/16/23 0940  BNP 1,272.0*    ProBNP (last 3 results) No results for input(s): "PROBNP" in the last 8760 hours.   D-Dimer No results for input(s): "DDIMER" in the last 72 hours. Hemoglobin A1C No results for input(s): "HGBA1C" in the last 72 hours. Fasting Lipid Panel Recent Labs    08/22/23 1215 08/23/23 0428  CHOL 123  --   HDL 27*  --   LDLCALC 73  --   TRIG 117 106  CHOLHDL 4.6  --    Thyroid Function Tests No results for input(s): "TSH", "T4TOTAL", "T3FREE", "THYROIDAB" in the last 72  hours.  Invalid input(s): "FREET3"  Other results:   Imaging    No results found.   Medications:     Scheduled Medications:  acetaminophen  650 mg Oral Q6H WA   arformoterol  15 mcg Nebulization BID   ascorbic acid  250 mg Oral BID   aspirin  81 mg Oral Daily   atorvastatin  40 mg Oral Daily   budesonide (PULMICORT) nebulizer solution  0.25 mg Nebulization BID   Chlorhexidine Gluconate Cloth  6 each Topical Q0600   famotidine  20 mg Oral Daily   feeding supplement  237 mL Oral TID BM   gabapentin  200 mg Oral QHS   heparin injection (subcutaneous)  5,000 Units Subcutaneous Q8H   insulin aspart  0-9 Units Subcutaneous Q4H   insulin glargine-yfgn  5 Units Subcutaneous Daily   methocarbamol  500 mg Oral TID   multivitamin  1 tablet Oral BID   mupirocin ointment  1 Application Nasal BID   mouth rinse  15 mL Mouth Rinse TID   revefenacin  175 mcg Nebulization Daily   sodium chloride flush  3 mL Intravenous Q12H   sodium chloride flush  3 mL  Intravenous Q12H    Infusions:   prismasol BGK 4/2.5 400 mL/hr at 08/22/23 2049    prismasol BGK 4/2.5 400 mL/hr at 08/22/23 2049   sodium chloride     sodium chloride 10 mL/hr at 08/23/23 0800   sodium chloride     norepinephrine (LEVOPHED) Adult infusion Stopped (08/05/2023 1055)   prismasol BGK 2/2.5 dialysis solution 1,500 mL/hr at 08/23/23 0640    PRN Medications: sodium chloride, sodium chloride, acetaminophen, docusate, heparin, hydrALAZINE, HYDROmorphone (DILAUDID) injection, ondansetron (ZOFRAN) IV, mouth rinse, oxyCODONE, polyethylene glycol, sodium chloride flush, sodium chloride flush    Patient Profile   Antonio [name]. is a 67 y.o. male with HFrEF, CAD s/p PCI, HTN, PAD s/p L BKA and LSFA stenting 2013, DMII, R foot osteomyelitis s/p right iliofemoral endarterectomy and R MTA. Admitted with PEA arrest>shock. Now requiring CRRT. AHF team asked to see for acute biventricular systolic HF.   Assessment/Plan   PEA arrest>NSTEMI>Cardiogenic shock - Shock now resolved, initially lactic acid 6.9> 5.4> 3. SCAI stage C. Requiring pressor support. - L/RHC 08/14/2023: severe 3v CAD, proximal RCA occluded, prox LAD heavily calcified - Now off all pressors.  - HsTrop 6K> >24K   Acute biventricular systolic heart failure, iCM - Echo 3/23: EF 55-60%, no RWMA, trivial MR. - Echo 9/24: EF <20%, LV with GHK, mild LVH, RV severely reduced, trivial MR - [facility] 08/16/2023: severe 3v CAD, proximal RCA occluded, prox LAD  heavily calcified, RHC with moderately elevated RA pressure, moderate pulmonary hypertension and severely reduced cardiac output. RA 15, PA 52/26 (35), PW 17, CO 4.22/CI 1.86, PVR 4.25, PAPi 1.6 - GDMT limited by renal function - No SGLT2i with UTI - Co-ox 59% yesterday - Not a candidate for advanced therapies (transplant/LVAD) with comorbidities PAD, tobacco abuse   CAD - Hx LCx stent ~20 years ago - [facility] 08/22/2023: severe 3v CAD, proximal RCA occluded, prox LAD   heavily calcified - No chest pain - Continue ASA and statin - Can revisit PCI when more stable   ATN - suspect 2/2 PEA arrest - SCr 2 today, baseline appears around 1.2-1.8 - now on CRRT with plans for iHD - nephrology following - avoid hypotension - Suspect there is a chance of renal function recovery, EF previously normal.    2nd degree AVB -  monitor, should improve   PAD - VVS following - S/p L BKA and R TMA - Plan for AKA of BKA. Patient wants to hold off for now and try pain management   HLD - check lipid panel. LDL last 112 23' - Continue statin   Hypertension -SBP 120s-140s -ok with slightly higher pressure to allow renal perfusion -If SBP continues to rise can add hydralazine/Imdur   DMII - A1c 6.6   GOC - GOC discussed with patient and son at bedside. Patient wishes to be DNR/DNI.   Length of Stay: 8  Alen Bleacher, NP  08/23/2023, 8:43 AM  Advanced Heart Failure Team Pager [phone] (M-F; 7a - 5p)  Please contact [facility] for night-coverage after hours (5p -7a ) and weekends on [email or web address]   Agree with above.   Remains on CVVHD. Anuric. Only complaint is LLE pain and that he is not getting enough pain meds. SBP 120-140s   General:  Sitting up in bed. Chronically-ill appearing. No resp difficulty HEENT: normal Neck: supple. no JVD. Carotids 2+ bilat; no bruits. No lymphadenopathy or thryomegaly appreciated. ZDG:UYQIHKV rate & rhythm. No rubs, gallops or murmurs. Lungs: decreased throughout Abdomen: soft, nontender, nondistended. No hepatosplenomegaly. No bruits or masses. Good bowel sounds. Extremities: no cyanosis, clubbing, rash, edema s/p L BKA with ischemic stump. S/p R TMA Neuro: alert & orientedx3, cranial nerves grossly intact. moves all 4 extremities w/o difficulty. Affect pleasant  He has been stable from HF standpoint on CVVHD despite low EF. No agnina.   As stated yesterday, in my mind main issue now is his renal function. If kidneys do  not recover then we would have to see if he can tolerate iHD. If so, then would proceed with this. If not, then we would be facing a Palliative situation. Given his cath findings, I am hopeful that he has a component of stunned myocardium and that EF will recover some as he gets farther out from his arrest. Would add hydral/nitrates once BP stable on iHD. D/w CCM team.   He is not candidate for advanced HF therapies given comorbidities. Will continue aggressive care but he would not want CPR or intubation in the case of recurrent arrest.   AHF team will sign off and request [facility] to resume cardiac care. Please call us if we can help.   CRITICAL CARE Performed by: Arvilla Meres  Total critical care time: 45 minutes  Critical care time was exclusive of separately billable procedures and treating other patients.  Critical care was necessary to treat or prevent imminent or life-threatening deterioration.  Critical care was time spent personally by me (independent of midlevel providers or residents) on the following activities: development of treatment plan with patient and/or surrogate as well as nursing, discussions with consultants, evaluation of patient's response to treatment, examination of patient, obtaining history from patient or surrogate, ordering and performing treatments and interventions, ordering and review of laboratory studies, ordering and review of radiographic studies, pulse oximetry and re-evaluation of patient's condition.  Arvilla Meres, MD  9:52 AM

## 2023-08-23 NOTE — TOC Progression Note (Signed)
Transition of Care (TOC) - Progression Note    Patient Details  Name: Antonio [name]. MRN: [id number] Date of Birth: [birth year]  Transition of Care [facility]) CM/SW Contact  Elliot Cousin, RN Phone Number: [phone] 08/23/2023, 11:31 AM  Clinical Narrative:   CM spoke to pt and son at bedside. Pt scheduled for surgery on Monday for left AKA. He currently lives with his children. Will continue to follow as pt progresses for possible SNF rehab vs HH. Will need PT/OT evaluation and recommendations.     Expected Discharge Plan: Home w Home Health Services Barriers to Discharge: Continued Medical Work up  Expected Discharge Plan and Services   Discharge Planning Services: CM Consult   Living arrangements for the past 2 months: Single Family Home                                       Social Determinants of Health (SDOH) Interventions SDOH Screenings   Food Insecurity: No Food Insecurity (08/22/2023)  Housing: Low Risk  (08/22/2023)  Transportation Needs: No Transportation Needs (08/22/2023)  Utilities: Not At Risk (08/22/2023)  Tobacco Use: High Risk (08/22/2023)    Readmission Risk Interventions    01/09/2022   11:29 AM  Readmission Risk Prevention Plan  Transportation Screening Complete  PCP or Specialist Appt within 5-7 Days --  Home Care Screening Complete  Medication Review (RN CM) Complete

## 2023-08-23 NOTE — Progress Notes (Signed)
eLink Physician-Brief Progress Note Patient Name: Antonio [name]. DOB: 12-Mar-1956 MRN: [id number]   Date of Service  08/23/2023  HPI/Events of Note  Patient is very calm currently, no indication for Precedex gtt.  eICU Interventions  Will discontinue Precedex and order PRN IM Zyprexa for recurrence of agitation.        Thomasene Lot Brittanie Dosanjh 08/23/2023, 8:29 PM

## 2023-08-23 NOTE — Progress Notes (Addendum)
Progress Note    08/23/2023 7:37 AM 2 Days Post-Op  Subjective: Says his left leg is still hurting    Vitals:   08/23/23 0500 08/23/23 0600  BP: 139/63 (!) 143/79  Pulse: 89 85  Resp: (!) 26 11  Temp:    SpO2: 94% 94%    Physical Exam: General: Sitting up in bed eating breakfast Lungs:  Nonlabored Extremities: Left BKA continued ischemic appearance, right TMA unchanged   CBC    Component Value Date/Time   WBC 10.9 (H) 08/20/2023 0511   RBC 4.91 08/20/2023 0511   HGB 13.6 08/24/2023 1523   HCT 40.0 07/28/2023 1523   PLT 141 (L) 08/20/2023 0511   MCV 84.1 08/20/2023 0511   MCH 24.6 (L) 08/20/2023 0511   MCHC 29.3 (L) 08/20/2023 0511   RDW 15.4 08/20/2023 0511   LYMPHSABS 1.2 08/26/2023 1316   MONOABS 0.6 08/20/2023 1316   EOSABS 0.2 08/04/2023 1316   BASOSABS 0.0 08/03/2023 1316    BMET    Component Value Date/Time   NA 133 (L) 08/23/2023 0428   K 4.0 08/23/2023 0428   CL 102 08/23/2023 0428   CO2 25 08/23/2023 0428   GLUCOSE 131 (H) 08/23/2023 0428   BUN 22 08/23/2023 0428   CREATININE 2.03 (H) 08/23/2023 0428   CALCIUM 7.8 (L) 08/23/2023 0428   GFRNONAA 36 (L) 08/23/2023 0428   GFRAA >60 02/15/2019 0455    INR    Component Value Date/Time   INR 1.0 02/03/2022 1947     Intake/Output Summary (Last 24 hours) at 08/23/2023 0737 Last data filed at 08/23/2023 0700 Gross per 24 hour  Intake 1560 ml  Output 2646 ml  Net -1086 ml      Assessment/Plan:  67 y.o. 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  It is ischemic appearing and cool to touch -Palliative is on board with the patient.  Currently he is DNR/DNI but is open to interventions that will prolong is life -We have discussed with the patient if he really wants a left AKA we can do this, however it may have issues with healing due to his iliac disease. At this time he is undecided on surgery. He says the pain bothers him but  he can tolerate it. We will continue to follow   Loel Dubonnet, PA-C [facility] [phone] 08/23/2023 7:37 AM  I have seen and evaluated the patient. I agree with the PA note as documented above.  67 year old male admitted to the ICU after cardiac arrest now with renal failure and biventricular failure.  Vascular surgery was consulted for his worsening chronic PAD.  Now having increasing ischemic changes to his left BKA that was done years ago.  He tells me today that he cannot tolerate the discomfort.  I have offered him a left above-knee amputation.  I did discuss with him and his son that this is high risk given I cannot feel a femoral pulse on the left and he has known iliac disease.  He does have a left common femoral signal.  He wishes to proceed.  Will tentatively post for Monday.  Discussed with his son that they could certainly further discuss options through the weekend.  Had a palliative care meeting and they said DNR/DNI but proceed with all therapies.  Will post as hopefully regional anesthesia to avoid any general anesthetic.  Cephus Shelling, MD [facility] [place] Office: [phone]

## 2023-08-23 NOTE — Progress Notes (Signed)
NAME:  Antonio [name]., MRN:  [id number], DOB:  02-Dec-1955, LOS: 8 ADMISSION DATE:  09/07/23, CONSULTATION DATE:  09-07-2023 REFERRING MD:  Dr. Criss Alvine, CHIEF COMPLAINT:  cardiac arrest   History of Present Illness:  Patient is a 67 yo M w/ pertinent PMH of HTN, CAD s/p stent placement, PAD s/p L BKA and LSFA stenting 2013, T2DM, right foot osteomyelitis s/p right iliofemoral endarterectomy and right metatarsal amputation presents to [facility] ED on 09-07-2023 w/ UTI.  Patient having UTI symptoms for the past 4-5 days. Also having b/l back pain and abd pain. Also having chronic right leg pain that has been progressively worsening over the last 4-5 months. On arrival patient alert and hypertensive 162/120. Afebrile and wbc 7.8. UA w/ leukocytes and uc pending. Started on rocephin. Patient also w/ creat 2.32. Patient taken to vascular lab and dvt US with no evidence of dvt and arterial duplex showing 75-99% stenosis in deep femoral artery and 50-74% stenosis in superficial femoral artery recommending vascular consult.   While in ED patient found became unresponsive w/ clinched teeth. Patient was pulseless and cpr started w/ rosc in 2 minutes. Attempted to intubated after code and very difficult airway and initially could not pass ETT. Patient coded during intubation procedure. 6.5 ETT was able to be placed and ROSC was achieved. Patient on levo and epi. Patient hr 50s. EKG showing complete AV block. Cards consulted and taking for emergent cath. PCCM consulted for icu admission.  Pertinent  Medical History   Past Medical History:  Diagnosis Date   Coronary artery disease    Diabetes mellitus without complication (HCC)    typ2   GERD (gastroesophageal reflux disease)    Hyperlipidemia    Hypertension    Osteomyelitis (HCC) 03/25/2017   RT FOOT    Significant Hospital Events: Including procedures, antibiotic start and stop dates in addition to other pertinent events   07-Sep-2023 admitted initially w/ UTI;  while in ED pea arrest, difficult intubation. Complete heart block going to cath lab 9/21 extubated 9/22 CRRT start 9/25 high degree AV block self-resolved. R/LHC 3vdz pHTN HFrEF low CO  9/26 starting GOC conversations. Consulting Adv HF. DNR   Interim History / Subjective:   Still w good Bps on CRRT   Having a lot of leg pain.   Objective   Blood pressure (!) 131/58, pulse 97, temperature 98.5 F (36.9 C), temperature source Oral, resp. rate (!) 28, height 6' (1.829 m), weight 88.1 kg, SpO2 95%.        Intake/Output Summary (Last 24 hours) at 08/23/2023 0948 Last data filed at 08/23/2023 0900 Gross per 24 hour  Intake 1910 ml  Output 2800.4 ml  Net -890.4 ml   Filed Weights   08/09/2023 0515 08/22/23 0500 08/23/23 0500  Weight: 100.8 kg 93.6 kg 88.1 kg    Examination: Gen: Chronically and acutely ill middle aged M  HEENT: Poor dentition. NCAT  Pulm: symmetrical chest expansion even unlabored  CV: s1s2  GI soft ndnt  GU: anuric  MSK: L BKA with apparent ischemic changes and R TMA but refuses tactile exam  Skin: pale, cool   Resolved Hospital Problem list   HTNsive urgency- resolved with fluid removal Acute respiratory failure w/ hypoxia and hypercapnia- significant emphysema noted on CT; extubated 9/22, intermittent BIPAP need likely now due to volume, significantly improved Metabolic encephalopathy- improved/resolved CPR- induced rib fractures Question CAP, UTI- completing course of abx  Assessment & Plan:   PEA arrest, NSTEMI,  cardiogenic shock (improved)  Inferior MI 3vCAD AoC HFrEF, RV dysfunction pHTN High degree AV block  -R/LHC 9/25 with severe 3vCAD -- occluded pRCA, pLAD heavily calcified, Lcx w significant dz distal to existing stent which was patent. Moderately elevated RA pressures-- moderate pHTN, severely reduced CO.  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 -prior trach  P -noted -- code status was updated to DNR/I 9/26 so this is likely only relevant now if procedure is needed    AKI Hyponatremia  Hypermagnesemia  P -per nephro, renal recovery is not expected -on CRRT, but hemodynamically likely ready to try iHD -plan to switch to iHD either next filter change or following -overall course would be limited if he is unable to hemodynamically tolerate iHD and he is aware of this   PAD Limb ischemia  S/p R TMA, L BKA  Pain due to above  P -VVS following  -if aggressive care were continued would need L AKA  -having much worse pain 9/27 and while he will not comply w physical examination of LLE, overnight note concerning for loss of fem pulse signal.  -incr gabapentin to BID (w/o anticipated renal recovery, not overly concerned about this) + incr range of PRN dilaudid  -n.b his pain is very sensitive to hemodynamic changes -- when he is normo/hypotensive his pain is a lot worse   HLD -statin   DM2 with hyperglycemia  -SSI  GOC DNR/I  -started more pointed Goc talks 9/26 given complexity of his multifactorial problems + anticipated med/surg needs if full scope of aggressive care were pursued.  -arrived at DNR/I, wanting to cont scope of med tx. Sounds like VVS expects he will need L AKA, but with minimal pulsatility of L fem artery concerns that this might not heal well either    Best Practice (right click and "Reselect all SmartList Selections" daily)   Diet/type: heart healthy DVT prophylaxis: heparin dvt ppx dose GI prophylaxis: H2B Lines: HD catheter Foley:  yes Code Status:  full code Last date of multidisciplinary goals of care discussion [updated son 9/24]  CRITICAL CARE Performed by: Lanier Clam   Total critical care time: 36 minutes  Critical  care time was exclusive of separately billable procedures and treating other patients. Critical care was necessary to treat or prevent imminent or life-threatening deterioration.  Critical care was time spent personally by me on the following activities: development of treatment plan with patient and/or surrogate as well as nursing, discussions with consultants, evaluation of patient's response to treatment, examination of patient, obtaining history from patient or surrogate, ordering and performing treatments and interventions, ordering and review of laboratory studies, ordering and review of radiographic studies, pulse oximetry and re-evaluation of patient's condition.  Tessie Fass MSN, AGACNP-BC [facility] Pulmonary/Critical Care Medicine Amion for pager 08/23/2023, 9:48 AM

## 2023-08-23 NOTE — Progress Notes (Signed)
[facility] Resident Progress Note  Assessment/plan:  67 year old with CKD 3B, PAD status post left lower extremity BKA and right lower extremity TMA, ischemic cardiomyopathy, presented with dysuria and rapidly deteriorated into PEA cardiac arrest x 2 with ROSC.  Hospital day 8, now with LVEF <20% reduced RV function.  Course complicated by hypervolemia, AKI with oliguria despite trial of furosemide, hypotension with worsening left lower extremity stump ischemia, now on CRRT.  Acute renal failure Presented with AKI on CKD 3B, presumably due to ATN from septic and cardiogenic shock.  Day 4 of CRRT.  Poor chance of renal recovery and near to midterm.  No urine output overnight.  Hemodynamics are improved, may tolerate a trial of intermittent HD, which is still concordant with this person's goals of care.  Will discontinue CRRT in preparation for this.  Discussed that if intermittent HD is complicated by hypotension, this is not likely a survivable condition, which he understands.  Hypervolemia, improved 2.6 out with CRRT, no urine output, net -4.9 L since admit.  Exquisitely sensitive to hypotension with signs of worsening ischemia anytime MAP drops below perfusing pressure.  Euvolemic today, continue to run at 0 to -50 mL/h.  Biventricular heart failure Severely reduced EF.  Co. ox 59% yesterday.  Hemodynamics may preclude intermittent HD.  Acute inferior MI Note total occlusion of RCA and heavily calcified LAD lesion, both thought to be chronic.  Ischemic left BKA stump VVS following.  Hypoxic respiratory failure COPD Stable on 3 L via nasal cannula.  Goals of care DNR but would pursue intermittent HD for life prolongation.  Final recommendations pending attending attestation.  Subjective:  Feeling overall okay, no concerns since yesterday.  Painful left BKA stump continues to be his primary worry.  Otherwise, breathing okay, no chest discomfort.  Spirits are  good.  Scheduled Meds:  acetaminophen  650 mg Oral Q6H WA   arformoterol  15 mcg Nebulization BID   ascorbic acid  250 mg Oral BID   aspirin  81 mg Oral Daily   atorvastatin  40 mg Oral Daily   budesonide (PULMICORT) nebulizer solution  0.25 mg Nebulization BID   famotidine  20 mg Oral Daily   feeding supplement  237 mL Oral TID BM   gabapentin  200 mg Oral BID   heparin injection (subcutaneous)  5,000 Units Subcutaneous Q8H   insulin aspart  0-9 Units Subcutaneous Q4H   insulin glargine-yfgn  5 Units Subcutaneous Daily   methocarbamol  500 mg Oral TID   multivitamin  1 tablet Oral BID   mupirocin ointment  1 Application Nasal BID   mouth rinse  15 mL Mouth Rinse TID   revefenacin  175 mcg Nebulization Daily   sodium chloride flush  3 mL Intravenous Q12H   sodium chloride flush  3 mL Intravenous Q12H   Continuous Infusions:   prismasol BGK 4/2.5 400 mL/hr at 08/23/23 0935    prismasol BGK 4/2.5 400 mL/hr at 08/23/23 0941   sodium chloride     sodium chloride 10 mL/hr at 08/23/23 0900   sodium chloride     prismasol BGK 2/2.5 dialysis solution 1,500 mL/hr at 08/23/23 0640   PRN Meds:.sodium chloride, sodium chloride, acetaminophen, docusate, heparin, hydrALAZINE, HYDROmorphone (DILAUDID) injection, ondansetron (ZOFRAN) IV, mouth rinse, oxyCODONE, polyethylene glycol, sodium chloride flush, sodium chloride flush  Objective:  BP (!) 131/58   Pulse 97   Temp 98.5 F (36.9 C) (Oral)   Resp (!) 28   Ht 6' (1.829 m)  Wt 88.1 kg   SpO2 95%   BMI 26.34 kg/m   No distress Heart rate and rhythm normal, strong radial pulse, ischemic left BKA stump, good capillary refill right lower extremity Breathing comfortably on 3 L via nasal cannula, lungs clear anteriorly Alert and oriented Pleasant and congruent affect   Intake/Output Summary (Last 24 hours) at 08/23/2023 0948 Last data filed at 08/23/2023 0900 Gross per 24 hour  Intake 1910 ml  Output 2800.4 ml  Net -890.4 ml       Latest Ref Rng & Units 08/23/2023    4:28 AM 08/22/2023    8:37 PM 08/22/2023    3:55 PM  CMP  Glucose 70 - 99 mg/dL 621   308   BUN 8 - 23 mg/dL 22   24   Creatinine 6.57 - 1.24 mg/dL 8.46   9.62   Sodium 952 - 145 mmol/L 133   132   Potassium 3.5 - 5.1 mmol/L 4.0  3.9  3.9   Chloride 98 - 111 mmol/L 102   100   CO2 22 - 32 mmol/L 25   24   Calcium 8.9 - 10.3 mg/dL 7.8   7.4    Marrianne Mood MD 08/23/2023, 9:49 AM

## 2023-08-23 NOTE — Progress Notes (Signed)
Daily Progress Note   Patient Name: Antonio [name].       Date: 08/23/2023 DOB: [birth year]  Age: 67 y.o. MRN#: [id number] Attending Physician: Lorin Glass, MD Primary Care Physician: Erskine Emery, NP Admit Date: 08/12/2023  Reason for Consultation/Follow-up: Establishing goals of care  Subjective: Some increased pain today - meds adjusted earlier by CCM  Length of Stay: 8  Current Medications: Scheduled Meds:   acetaminophen  650 mg Oral Q6H WA   arformoterol  15 mcg Nebulization BID   ascorbic acid  250 mg Oral BID   aspirin  81 mg Oral Daily   atorvastatin  40 mg Oral Daily   budesonide (PULMICORT) nebulizer solution  0.25 mg Nebulization BID   famotidine  20 mg Oral Daily   feeding supplement  237 mL Oral TID BM   gabapentin  200 mg Oral BID   heparin injection (subcutaneous)  5,000 Units Subcutaneous Q8H   insulin aspart  0-9 Units Subcutaneous Q4H   insulin glargine-yfgn  5 Units Subcutaneous Daily   methocarbamol  500 mg Oral TID   multivitamin  1 tablet Oral BID   mouth rinse  15 mL Mouth Rinse TID   revefenacin  175 mcg Nebulization Daily   sodium chloride flush  3 mL Intravenous Q12H   sodium chloride flush  3 mL Intravenous Q12H    Continuous Infusions:   prismasol BGK 4/2.5 400 mL/hr at 08/23/23 0935    prismasol BGK 4/2.5 400 mL/hr at 08/23/23 0941   sodium chloride     sodium chloride 10 mL/hr at 08/23/23 1300   sodium chloride     prismasol BGK 2/2.5 dialysis solution 1,500 mL/hr at 08/23/23 1344    PRN Meds: sodium chloride, sodium chloride, acetaminophen, docusate, heparin, hydrALAZINE, HYDROmorphone (DILAUDID) injection, ondansetron (ZOFRAN) IV, mouth rinse, oxyCODONE, polyethylene glycol, sodium chloride flush, sodium chloride flush  Physical  Exam Constitutional:      General: He is not in acute distress.    Appearance: He is ill-appearing.     Comments: Sitting on EOB  Pulmonary:     Effort: Pulmonary effort is normal.  Neurological:     Mental Status: He is alert and oriented to person, place, and time.             Vital Signs: BP 123/71   Pulse 90   Temp 98.2 F (36.8 C) (Oral)   Resp 19   Ht 6' (1.829 m)   Wt 88.1 kg   SpO2 94%   BMI 26.34 kg/m  SpO2: SpO2: 94 % O2 Device: O2 Device: Nasal Cannula O2 Flow Rate: O2 Flow Rate (L/min): 3 L/min  Intake/output summary:  Intake/Output Summary (Last 24 hours) at 08/23/2023 1406 Last data filed at 08/23/2023 1300 Gross per 24 hour  Intake 2190 ml  Output 3187 ml  Net -997 ml   LBM: Last BM Date : 08/11/2023 Baseline Weight: Weight: 102.9 kg Most recent weight: Weight: 88.1 kg       Palliative Assessment/Data: PPS 40%      Patient Active Problem List   Diagnosis Date Noted   Pulmonary hypertension (HCC) 08/23/2023   Lower limb ischemia 08/23/2023   Acute on  chronic HFrEF (heart failure with reduced ejection fraction) (HCC) 08/23/2023   Malnutrition of moderate degree 08/22/2023   HFrEF (heart failure with reduced ejection fraction) (HCC) 08/22/2023   PAH (pulmonary artery hypertension) (HCC) 08/22/2023   Acute renal failure (HCC) 08/22/2023   Goals of care, counseling/discussion 08/22/2023   Cardiac arrest (HCC) 08/23/2023   Heart block AV complete (HCC) 08/05/2023   Complete heart block (HCC) 07/29/2023   Acute heart failure with preserved ejection fraction (HFpEF) (HCC)    Wound dehiscence 02/03/2022   AKI (acute kidney injury) (HCC)    Benign essential HTN    Uncontrolled type 2 diabetes mellitus with hyperglycemia (HCC)    S/P transmetatarsal amputation of foot, 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/09/2022   Preoperative cardiovascular examination    PVD (peripheral vascular disease) (HCC)    Osteomyelitis (HCC) 12/28/2021   Airway trauma    Acute respiratory  failure (HCC)    Dysphagia    Status post tracheostomy (HCC)    HCAP (healthcare-associated pneumonia)    Diabetes mellitus type 2 in nonobese (HCC)    Hypertension    S/P BKA (below knee amputation) unilateral, left (HCC)    Tachypnea    Pain    Agitation    Acute blood loss anemia    Phlegm in throat    Fever, unspecified    Tachycardia    Acute on chronic respiratory failure (HCC)    Hypoxemia    Acute pulmonary edema (HCC)    Hypoxia    Tracheostomy status (HCC)    Pneumonia    Influenza A virus present    Pressure injury of skin 12/01/2018   SIRS (systemic inflammatory response syndrome) (HCC) 11/28/2018   Acute respiratory failure with hypoxemia (HCC) 11/28/2018   Influenza A    Hyponatremia    Chest pain 05/02/2018   Hyperlipidemia 05/02/2018   Tobacco abuse 05/02/2018   Coronary artery disease involving native coronary artery of native heart with angina pectoris (HCC) 05/02/2018   S/P transmetatarsal amputation of foot, left (HCC) 04/15/2017   Amputated great toe of right foot (HCC) 04/15/2017   Ulcer of toe of left foot, limited to breakdown of skin (HCC) 04/04/2017   Partial nontraumatic amputation of foot, right (HCC) 04/04/2017   Type 2 diabetes mellitus with hyperglycemia, with long-term current use of insulin (HCC)    Edema    Wound infection after surgery     Palliative Care Assessment & Plan   HPI: 67 y.o. male  admitted on 08/05/2023 with past medical history significant for hypertension, coronary artery disease CHF, PAD status post left BKA and right metatarsal amputation. According to family patient has been seen by her primary care physician in over a year.   In ED suffered PEA cardiac arrest x 2 with ROSC. Admitted to ICU for septic and cardiogenic shock due to UTI and PEA cardiac arrest. Remained in complete heart block after ROSC. Needed transvenous pacing to improve cardiac output. Echo shows LVEF <20% and severely reduced RV systolic function.  Suffered AKI with creatinine of 5 and hyperkalemia, hypervolemia. Failed trial of diuresis with furosemide. CRRT started in ICU.    Required initiation of pressors again today to maintain BP.     Patient and family face treatment option decisions, advanced directive decisions and anticipatory care needs.  Assessment: Received update from RN. Follow up today with patient. He is in good spirits again; sitting on EOB. Per RN, moved well getting to EOB.  We review tentative plan - likely stopping CRRT  this evening? Nephrology to follow closely for iHD start. Patient understands plan. He is also considering surgery for painful lower extremity - seems open to this.  No family at bedside today.  Recommendations/Plan: Now DNR/DNI but interested in continuing all other care for the time being, declines family meeting - planning to stop CRRT soon, possible L AKA Monday? PMT will follow  Care plan was discussed with RN, patient Thank you for allowing the Palliative Medicine Team to assist in the care of this patient.   Total Time 30 minutes Prolonged Time Billed  no   Time spent includes: Detailed review of medical records (labs, imaging, vital signs), medically appropriate exam, discussion with treatment team, counseling and educating patient, family and/or staff, documenting clinical information, medication management and coordination of care.     *Please note that this is a verbal dictation therefore any spelling or grammatical errors are due to the "[employer] One" system interpretation.  Gerlean Ren, DNP, [facility] Palliative Medicine Team Team Phone # [phone]  Pager [phone]

## 2023-08-23 NOTE — Procedures (Signed)
I saw and evaluated the patient on CRRT.  I reviewed the last 24 hours events.  Adjustments to CRRT prescription are made as needed.  Likely no restart in next 12-24 hours and switch to [facility]  Filed Weights   08/20/2023 0515 08/22/23 0500 08/23/23 0500  Weight: 100.8 kg 93.6 kg 88.1 kg    Recent Labs  Lab 08/23/23 0428  NA 133*  K 4.0  CL 102  CO2 25  GLUCOSE 131*  BUN 22  CREATININE 2.03*  CALCIUM 7.8*  PHOS 2.9    Recent Labs  Lab 08/18/23 0258 Aug 21, 2023 0319 08/20/23 0511 08/20/23 1402 08/06/2023 1519 07/31/2023 1523  WBC 17.3* 11.8* 10.9*  --   --   --   HGB 12.1* 12.5* 12.1* 13.6 13.9 13.6  HCT 40.3 40.7 41.3 40.0 41.0 40.0  MCV 86.1 84.4 84.1  --   --   --   PLT 133* 113* 141*  --   --   --     Scheduled Meds:  acetaminophen  650 mg Oral Q6H WA   arformoterol  15 mcg Nebulization BID   ascorbic acid  250 mg Oral BID   aspirin  81 mg Oral Daily   atorvastatin  40 mg Oral Daily   budesonide (PULMICORT) nebulizer solution  0.25 mg Nebulization BID   famotidine  20 mg Oral Daily   feeding supplement  237 mL Oral TID BM   gabapentin  200 mg Oral QHS   heparin injection (subcutaneous)  5,000 Units Subcutaneous Q8H   insulin aspart  0-9 Units Subcutaneous Q4H   insulin glargine-yfgn  5 Units Subcutaneous Daily   methocarbamol  500 mg Oral TID   multivitamin  1 tablet Oral BID   mupirocin ointment  1 Application Nasal BID   mouth rinse  15 mL Mouth Rinse TID   revefenacin  175 mcg Nebulization Daily   sodium chloride flush  3 mL Intravenous Q12H   sodium chloride flush  3 mL Intravenous Q12H   Continuous Infusions:   prismasol BGK 4/2.5 400 mL/hr at 08/22/23 2049    prismasol BGK 4/2.5 400 mL/hr at 08/22/23 2049   sodium chloride     sodium chloride 10 mL/hr at 08/23/23 0800   sodium chloride     norepinephrine (LEVOPHED) Adult infusion Stopped (07/31/2023 1055)   prismasol BGK 2/2.5 dialysis solution 1,500 mL/hr at 08/23/23 0640   PRN Meds:.sodium chloride,  sodium chloride, acetaminophen, docusate, heparin, hydrALAZINE, HYDROmorphone (DILAUDID) injection, ondansetron (ZOFRAN) IV, mouth rinse, oxyCODONE, polyethylene glycol, sodium chloride flush, sodium chloride flush   Louie Bun,  MD 08/23/2023, 9:18 AM

## 2023-08-24 DIAGNOSIS — N179 Acute kidney failure, unspecified: Secondary | ICD-10-CM | POA: Diagnosis not present

## 2023-08-24 DIAGNOSIS — R739 Hyperglycemia, unspecified: Secondary | ICD-10-CM

## 2023-08-24 DIAGNOSIS — I998 Other disorder of circulatory system: Secondary | ICD-10-CM

## 2023-08-24 DIAGNOSIS — R52 Pain, unspecified: Secondary | ICD-10-CM

## 2023-08-24 DIAGNOSIS — I442 Atrioventricular block, complete: Secondary | ICD-10-CM | POA: Diagnosis not present

## 2023-08-24 DIAGNOSIS — J9601 Acute respiratory failure with hypoxia: Secondary | ICD-10-CM

## 2023-08-24 DIAGNOSIS — I469 Cardiac arrest, cause unspecified: Secondary | ICD-10-CM | POA: Diagnosis not present

## 2023-08-24 DIAGNOSIS — J9602 Acute respiratory failure with hypercapnia: Secondary | ICD-10-CM

## 2023-08-24 LAB — RENAL FUNCTION PANEL
Albumin: 2.2 g/dL — ABNORMAL LOW (ref 3.5–5.0)
Anion gap: 8 (ref 5–15)
BUN: 38 mg/dL — ABNORMAL HIGH (ref 8–23)
CO2: 25 mmol/L (ref 22–32)
Calcium: 7.9 mg/dL — ABNORMAL LOW (ref 8.9–10.3)
Chloride: 101 mmol/L (ref 98–111)
Creatinine, Ser: 3.07 mg/dL — ABNORMAL HIGH (ref 0.61–1.24)
GFR, Estimated: 22 mL/min — ABNORMAL LOW (ref >60)
Glucose, Bld: 124 mg/dL — ABNORMAL HIGH (ref 70–99)
Phosphorus: 3.4 mg/dL (ref 2.5–4.6)
Potassium: 3.6 mmol/L (ref 3.5–5.1)
Sodium: 134 mmol/L — ABNORMAL LOW (ref 135–145)

## 2023-08-24 LAB — CK: Total CK: 1048 U/L — ABNORMAL HIGH (ref 49–397)

## 2023-08-24 LAB — GLUCOSE, CAPILLARY
Glucose-Capillary: 163 mg/dL — ABNORMAL HIGH (ref 70–99)
Glucose-Capillary: 130 mg/dL — ABNORMAL HIGH (ref 70–99)
Glucose-Capillary: 125 mg/dL — ABNORMAL HIGH (ref 70–99)
Glucose-Capillary: 128 mg/dL — ABNORMAL HIGH (ref 70–99)
Glucose-Capillary: 226 mg/dL — ABNORMAL HIGH (ref 70–99)

## 2023-08-24 LAB — COOXEMETRY PANEL
Carboxyhemoglobin: 1.7 % — ABNORMAL HIGH (ref 0.5–1.5)
Methemoglobin: <0.7 % (ref 0.0–1.5)
O2 Saturation: 77.7 %
Total hemoglobin: 10.6 g/dL — ABNORMAL LOW (ref 12.0–16.0)

## 2023-08-24 LAB — HEPATITIS B SURFACE ANTIGEN: Hepatitis B Surface Ag: NONREACTIVE

## 2023-08-24 LAB — MAGNESIUM: Magnesium: 2.7 mg/dL — ABNORMAL HIGH (ref 1.7–2.4)

## 2023-08-24 MED ORDER — DIPHENHYDRAMINE HCL 50 MG/ML IJ SOLN: 25.0000 mg | INTRAMUSCULAR | Status: DC | PRN

## 2023-08-24 MED ORDER — FENTANYL 2500MCG IN NS 250ML (10MCG/ML) PREMIX INFUSION
0.0000 ug/h | INTRAVENOUS | Status: DC
  Administered 2023-08-24: 50 ug/h via INTRAVENOUS
  Administered 2023-08-25: 350 ug/h via INTRAVENOUS
  Filled 2023-08-24 (×2): qty 250

## 2023-08-24 MED ORDER — CHLORHEXIDINE GLUCONATE CLOTH 2 % EX PADS
6.0000 | MEDICATED_PAD | Freq: Every day | CUTANEOUS | Status: DC
  Administered 2023-08-24: 6 via TOPICAL

## 2023-08-24 MED ORDER — POLYVINYL ALCOHOL 1.4 % OP SOLN: 1.0000 [drp] | Freq: Four times a day (QID) | OPHTHALMIC | Status: DC | PRN

## 2023-08-24 MED ORDER — FENTANYL BOLUS VIA INFUSION
100.0000 ug | INTRAVENOUS | Status: DC | PRN
  Administered 2023-08-25 (×6): 100 ug via INTRAVENOUS

## 2023-08-24 MED ORDER — DOPAMINE-DEXTROSE 3.2-5 MG/ML-% IV SOLN: 0.0000 ug/kg/min | INTRAVENOUS | Status: DC

## 2023-08-24 MED ORDER — GLYCOPYRROLATE 0.2 MG/ML IJ SOLN
0.2000 mg | INTRAMUSCULAR | Status: DC | PRN
  Administered 2023-08-25 (×2): 0.2 mg via INTRAVENOUS
  Filled 2023-08-24 (×2): qty 1

## 2023-08-24 MED ORDER — ACETAMINOPHEN 325 MG PO TABS: 650.0000 mg | ORAL_TABLET | Freq: Four times a day (QID) | ORAL | Status: DC | PRN

## 2023-08-24 MED ORDER — ACETAMINOPHEN 650 MG RE SUPP: 650.0000 mg | Freq: Four times a day (QID) | RECTAL | Status: DC | PRN

## 2023-08-24 MED ORDER — GLYCOPYRROLATE 0.2 MG/ML IJ SOLN: 0.2000 mg | INTRAMUSCULAR | Status: DC | PRN

## 2023-08-24 MED ORDER — MIDAZOLAM HCL 2 MG/2ML IJ SOLN
2.0000 mg | INTRAMUSCULAR | Status: DC | PRN
  Administered 2023-08-25: 2 mg via INTRAVENOUS
  Filled 2023-08-24: qty 2

## 2023-08-24 MED ORDER — DOPAMINE-DEXTROSE 3.2-5 MG/ML-% IV SOLN
INTRAVENOUS | Status: AC
  Administered 2023-08-24: 5 ug/kg/min via INTRAVENOUS
  Filled 2023-08-24: qty 250

## 2023-08-24 MED ORDER — SODIUM CHLORIDE 0.9 % IV SOLN: INTRAVENOUS | Status: DC

## 2023-08-24 MED ORDER — GLYCOPYRROLATE 1 MG PO TABS: 1.0000 mg | ORAL_TABLET | ORAL | Status: DC | PRN

## 2023-08-24 MED ORDER — RANOLAZINE ER 500 MG PO TB12
500.0000 mg | ORAL_TABLET | Freq: Two times a day (BID) | ORAL | Status: DC
  Administered 2023-08-24: 500 mg via ORAL
  Filled 2023-08-24 (×2): qty 1

## 2023-08-24 MED ORDER — OXYCODONE HCL 5 MG PO TABS
15.0000 mg | ORAL_TABLET | Freq: Four times a day (QID) | ORAL | Status: DC
  Administered 2023-08-24: 15 mg via ORAL
  Filled 2023-08-24: qty 3

## 2023-08-24 NOTE — Plan of Care (Signed)
  Problem: Elimination: Goal: Will not experience complications related to urinary retention Outcome: Not Progressing   Problem: Pain Managment: Goal: General experience of comfort will improve Outcome: Progressing   Problem: Safety: Goal: Ability to remain free from injury will improve Outcome: Not Progressing

## 2023-08-24 NOTE — IPAL (Signed)
  Interdisciplinary Goals of Care Family Meeting   Date carried out: 08/24/2023  Location of the meeting: Bedside  Member's involved: Physician, Bedside Registered Nurse, and Family Member or next of kin  Durable Power of Attorney or acting medical decision maker: 3 adult children    Discussion: We discussed goals of care for [employer]. .  Mr. Jawad's 3  children were present to discuss his care. One son had spoken to Dr. Elberta Fortis earlier this afternoon after he had CHB requiring dopamine to be started. He subsequently had an episode of agonal respirations and family had been requested to come to bedside by RN. All three children understand how tenuous he has been and understand that he is unlikely to be both comfortable and progress during this admission. He has a very high risk of not surviving this admission in the best case scenario, and they understand how much he has suffered. They want to change the focus of his care to focus entirely on comfort. They are worried they could not maintain comfort at home and want to focus on inpatient comfort care. We discussed stopping life prolonging therapies while we focus purely on pain and other symptom control. All of his children are in agreement with this plan.   Code status:   Code Status: Limited: Do not attempt resuscitation (DNR) -DNR-LIMITED -Do Not Intubate/DNI    Disposition: In-patient comfort care  Time spent for the meeting: 15 min.    Steffanie Dunn, DO  08/24/2023, 6:26 PM

## 2023-08-24 NOTE — Plan of Care (Signed)
°  Problem: Activity: Goal: Ability to return to baseline activity level will improve Outcome: Progressing   Problem: Activity: Goal: Risk for activity intolerance will decrease Outcome: Progressing   Problem: Pain Managment: Goal: General experience of comfort will improve Outcome: Progressing   Problem: Safety: Goal: Ability to remain free from injury will improve Outcome: Progressing

## 2023-08-24 NOTE — Progress Notes (Signed)
Advanced Heart Failure Rounding Note  PCP-Cardiologist: Rollene Rotunda, MD   Subjective:    Became agitated overnight and Precedex was started.  Became both bradycardic and hypotensive and Precedex was stopped.  Sleepy this morning without acute complaint.  Objective:   Weight Range: 88.1 kg Body mass index is 26.34 kg/m.   Vital Signs:   Temp:  [96.5 F (35.8 C)-98.5 F (36.9 C)] 97 F (36.1 C) (09/28 0000) Pulse Rate:  [75-97] 85 (09/28 0300) Resp:  [12-37] 14 (09/28 0300) BP: (64-148)/(48-93) 116/66 (09/28 0300) SpO2:  [90 %-100 %] 97 % (09/28 0300) Last BM Date : September 06, 2023  Weight change: Filed Weights   08/22/2023 0515 08/22/23 0500 08/23/23 0500  Weight: 100.8 kg 93.6 kg 88.1 kg   Intake/Output:   Intake/Output Summary (Last 24 hours) at 08/24/2023 0733 Last data filed at 08/24/2023 0700 Gross per 24 hour  Intake 918.11 ml  Output 1832.9 ml  Net -914.79 ml    Physical Exam   GEN: Well nourished, well developed, in no acute distress  HEENT: normal  Neck: + JVD, carotid bruits, or masses Cardiac: RRR; no murmurs, rubs, or gallops,no edema  Respiratory:  clear to auscultation bilaterally, normal work of breathing GI: soft, nontender, nondistended, + BS MS: no deformity or atrophy  Skin: warm and dry, right TMA, chronic right lower extremity wounds Neuro:  Strength and sensation are intact Psych: euthymic mood, full affect    Telemetry   Sinus rhythm-personally reviewed  EKG    No new EKG today  Labs    CBC Recent Labs    08/07/2023 1519 08/17/2023 1523  HGB 13.9 13.6  HCT 41.0 40.0   Basic Metabolic Panel Recent Labs    91/47/82 0428 08/23/23 1842 08/24/23 0459  NA 133* 134* 134*  K 4.0 3.6 3.6  CL 102 102 101  CO2 25 25 25   GLUCOSE 131* 156* 124*  BUN 22 27* 38*  CREATININE 2.03* 2.37* 3.07*  CALCIUM 7.8* 7.7* 7.9*  MG 2.6*  --  2.7*  PHOS 2.9 2.4* 3.4   Liver Function Tests Recent Labs    08/23/23 1842 08/24/23 0459   ALBUMIN 2.3* 2.2*   No results for input(s): "LIPASE", "AMYLASE" in the last 72 hours. Cardiac Enzymes Recent Labs    08/26/2023 0949 08/24/23 0459  CKTOTAL 3,036* 1,048*    BNP: BNP (last 3 results) Recent Labs    08/16/23 0940  BNP 1,272.0*    ProBNP (last 3 results) No results for input(s): "PROBNP" in the last 8760 hours.   D-Dimer No results for input(s): "DDIMER" in the last 72 hours. Hemoglobin A1C No results for input(s): "HGBA1C" in the last 72 hours. Fasting Lipid Panel Recent Labs    08/22/23 1215 08/23/23 0428  CHOL 123  --   HDL 27*  --   LDLCALC 73  --   TRIG 117 106  CHOLHDL 4.6  --    Thyroid Function Tests No results for input(s): "TSH", "T4TOTAL", "T3FREE", "THYROIDAB" in the last 72 hours.  Invalid input(s): "FREET3"  Other results:   Imaging    No results found.   Medications:     Scheduled Medications:  acetaminophen  650 mg Oral Q6H WA   arformoterol  15 mcg Nebulization BID   ascorbic acid  250 mg Oral BID   aspirin  81 mg Oral Daily   atorvastatin  40 mg Oral Daily   budesonide (PULMICORT) nebulizer solution  0.25 mg Nebulization BID   Chlorhexidine  Gluconate Cloth  6 each Topical Daily   Chlorhexidine Gluconate Cloth  6 each Topical Q0600   famotidine  20 mg Oral Daily   feeding supplement  237 mL Oral TID BM   gabapentin  200 mg Oral BID   heparin injection (subcutaneous)  5,000 Units Subcutaneous Q8H   insulin aspart  0-9 Units Subcutaneous Q4H   insulin glargine-yfgn  5 Units Subcutaneous Daily   methocarbamol  500 mg Oral TID   multivitamin  1 tablet Oral BID   mouth rinse  15 mL Mouth Rinse TID   revefenacin  175 mcg Nebulization Daily   sodium chloride flush  3 mL Intravenous Q12H   sodium chloride flush  3 mL Intravenous Q12H    Infusions:  sodium chloride Stopped (08/23/23 0700)   sodium chloride 0 mL/hr at 08/23/23 1800   sodium chloride Stopped (08/23/23 0700)    PRN Medications: sodium chloride,  sodium chloride, acetaminophen, docusate, hydrALAZINE, HYDROmorphone (DILAUDID) injection, OLANZapine, ondansetron (ZOFRAN) IV, mouth rinse, oxyCODONE, polyethylene glycol, sodium chloride flush, sodium chloride flush    Patient Profile   Antonio [name]. is a 67 y.o. male with HFrEF, CAD s/p PCI, HTN, PAD s/p L BKA and LSFA stenting 2013, DMII, R foot osteomyelitis s/p right iliofemoral endarterectomy and R MTA. Admitted with PEA arrest>shock. Now requiring CRRT. AHF team asked to see for acute biventricular systolic HF.   Assessment/Plan   1.  PEA arrest/non-STEMI/cardiogenic shock:  Left heart catheterization with severe coronary artery disease Off all pressors. High-sensitivity troponin up to 24,000  2.  Acute biventricular heart failure: Due to ischemic cardiomyopathy Ejection fraction less than 20% Goal-directed care limited by renal function Not a candidate for advanced therapies due to PAD and tobacco abuse   3.  Coronary artery disease Post LAD stent Catheterization this admission with severe three-vessel disease with occluded RCA Continue aspirin and statin Could consider intervention once renal function is stabilized  4.  ATN: Likely due to PEA arrest.  Has been on CRRT.  Potential IHD today.  Plan per nephrology.  5.  Second-degree AV block: None noted on telemetry today  6.  Peripheral arterial disease: Followed by vascular surgery post left BKA, right TMA  7.  Hyperlipidemia: Continue statin  8.  Hypertension: Currently well-controlled   9.  Diabetes: Plan per primary team  Length of Stay: 9  Jani Ploeger Jorja Loa, MD  08/24/2023, 7:33 AM

## 2023-08-24 NOTE — Progress Notes (Signed)
NAME:  Antonio Pugliano., MRN:  [id number], DOB:  09-07-1956, LOS: 9 ADMISSION DATE:  08/02/2023, CONSULTATION DATE:  9/19 REFERRING MD:  Dr. Criss Alvine, CHIEF COMPLAINT:  cardiac arrest   History of Present Illness:  Patient is a 67 yo M w/ pertinent PMH of HTN, CAD s/p stent placement, PAD s/p L BKA and LSFA stenting 2013, T2DM, right foot osteomyelitis s/p right iliofemoral endarterectomy and right metatarsal amputation presents to [facility] ED on 9/19 w/ UTI.  Patient having UTI symptoms for the past 4-5 days. Also having b/l back pain and abd pain. Also having chronic right leg pain that has been progressively worsening over the last 4-5 months. On arrival patient alert and hypertensive 162/120. Afebrile and wbc 7.8. UA w/ leukocytes and uc pending. Started on rocephin. Patient also w/ creat 2.32. Patient taken to vascular lab and dvt US with no evidence of dvt and arterial duplex showing 75-99% stenosis in deep femoral artery and 50-74% stenosis in superficial femoral artery recommending vascular consult.   While in ED patient found became unresponsive w/ clinched teeth. Patient was pulseless and cpr started w/ rosc in 2 minutes. Attempted to intubated after code and very difficult airway and initially could not pass ETT. Patient coded during intubation procedure. 6.5 ETT was able to be placed and ROSC was achieved. Patient on levo and epi. Patient hr 50s. EKG showing complete AV block. Cards consulted and taking for emergent cath. PCCM consulted for icu admission.  Pertinent  Medical History   Past Medical History:  Diagnosis Date   Coronary artery disease    Diabetes mellitus without complication (HCC)    typ2   GERD (gastroesophageal reflux disease)    Hyperlipidemia    Hypertension    Osteomyelitis (HCC) 03/25/2017   RT FOOT    Significant Hospital Events: Including procedures, antibiotic start and stop dates in addition to other pertinent events   9/19 admitted initially w/ UTI;  while in ED pea arrest, difficult intubation. Complete heart block going to cath lab 9/21 extubated 9/22 CRRT start 9/25 high degree AV block self-resolved. R/LHC 3vdz pHTN HFrEF low CO  9/26 starting GOC conversations. Consulting Adv HF. DNR   Interim History / Subjective:  Still having severe left leg pain. Pain meds only marginally help.   Objective   Blood pressure 116/66, pulse 85, temperature (!) 97 F (36.1 C), temperature source Axillary, resp. rate 14, height 6' (1.829 m), weight 88.1 kg, SpO2 97%.        Intake/Output Summary (Last 24 hours) at 08/24/2023 0726 Last data filed at 08/24/2023 0700 Gross per 24 hour  Intake 918.11 ml  Output 1832.9 ml  Net -914.79 ml   Filed Weights   08/11/2023 0515 08/22/23 0500 08/23/23 0500  Weight: 100.8 kg 93.6 kg 88.1 kg    Examination: Gen:  chronically ill appearing man lying in bed in NAD. Room smells of tobacco. HEENT: eyes anicteric Pulm: rhales in left base, breathing comforatbly on [place]. CTA anteriorly.  CV: S1S2, RRR GI soft, NT, ND MSK: mottled distal LLE with BKA, R TMA with erytheme and chronic skin induration on the R. Skin: pallor, no diaphoresis.   [place] 78% BUN 3, Cr 3.07 CK 1048, downtrending  Resolved Hospital Problem list   HTNsive urgency- resolved with fluid removal Acute respiratory failure w/ hypoxia and hypercapnia- significant emphysema noted on CT; extubated 9/22, intermittent BIPAP need likely now due to volume, significantly improved Metabolic encephalopathy- improved/resolved CPR- induced rib fractures Question CAP,  UTI- completing course of abx  Assessment & Plan:   PEA arrest, NSTEMI, cardiogenic shock (improved)  Inferior MI 3vCAD AoC HFrEF, RV dysfunction pHTN High degree AV block > back in NSR with appropriate conduction -R & LHC 9/25 with severe 3vCAD -- occluded pRCA, pLAD heavily calcified, Lcx w significant dz distal to existing stent which was patent. Moderately elevated RA pressures--  moderate pHTN, severely reduced CO.  Severely reduced LVEF  -Consulted Adv HF -- GDMT limited with chronic renal failure and hypotension. Appreciate their recommendations.  -Not an MCS candidate due to multisystem disease. Possibility of PCI depends on clinical course.   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 Needs trial of iHD.  -Overall course would be limited if he is unable to hemodynamically tolerate iHD and he is aware of this. I do worry about worse ischemic discomfort during HD runs.   PAD- previous R TMA, L BKA Acute limb ischemia on left; ischemic pain is main issue for patient -Appreciate VS's management; planning for OR on 9/30 for L AKA. Patient and family aware of concern for non-healing post-op due to chronic PAD proximally. I reiterated this today.   -Con't gabapentin; worry about toxicity with renal failure.  Monitor for excessive sedation. Can't use baclofen due to renal failure.  -trial of ranexa, suspicious this will be of limited benefit unfortunately -con't dilaudid PRN for breakthrough -Adding scheduled oxycodone 15mg  q6h to help with pain control.  HLD -con't statin   DM2 with hyperglycemia - controlled -SSI PRN -semglee 5 units daily -goal BG 140-180  GOC DNR/I  -Ongoing GOC talks as needed- has multifactorial problems and limited recovery potential with planned surgery.  -Arrived at DNR/I, wanting to cont scope of med tx.   Family at bedside updated during rounds this morning.   Best Practice (right click and "Reselect all SmartList Selections" daily)   Diet/type: heart healthy DVT prophylaxis: heparin dvt ppx dose GI prophylaxis: H2B Lines: HD catheter Foley:  yes Code Status:  DNR Last date of multidisciplinary  goals of care discussion [updated son 9/24]    Steffanie Dunn, DO 08/24/23 12:26 PM [facility]  For contact information, see Amion. If no response to pager, please call PCCM consult pager. After hours, 7PM- 7AM, please call Elink.

## 2023-08-24 NOTE — Progress Notes (Addendum)
Called to the patient's room as he has gone into complete heart block.  QRS similar to his QRS in sinus rhythm.  Systolic blood pressure in the 70s.  Patient at his baseline mental status and minimally responsive.  Dopamine started at 5 micrograms per kilogram per minute.  Very quickly after starting dopamine, patient's conduction improved to 1:1.  [name] came to the room.  Discussion was had about goals of care and direction of patient's hospitalization.  [name] did not feel like the patient would benefit from pacemaker implant with all of his other chronic medical issues.  Would not plan for temporary wire implant and would treat his heart block if it recurs medically.  [name] Rollo Farquhar discuss further goals of care with family, and may decide on comfort measures.  Eliaz Fout Elberta Fortis, MD 08/24/2023 4:21 PM

## 2023-08-24 NOTE — Progress Notes (Signed)
Progress Note    08/24/2023 10:30 AM 3 Days Post-Op  Subjective: Significant pain left leg    Vitals:   08/24/23 0730 08/24/23 0800  BP:  (!) 148/72  Pulse: 87 90  Resp: 15 14  Temp:    SpO2: 100% 96%    Physical Exam: General: Sitting up in bed eating breakfast Lungs:  Nonlabored Extremities: Left BKA continued ischemic appearance, right TMA unchanged   CBC    Component Value Date/Time   WBC 10.9 (H) 08/20/2023 0511   RBC 4.91 08/20/2023 0511   HGB 13.6 08/03/2023 1523   HCT 40.0 08/08/2023 1523   PLT 141 (L) 08/20/2023 0511   MCV 84.1 08/20/2023 0511   MCH 24.6 (L) 08/20/2023 0511   MCHC 29.3 (L) 08/20/2023 0511   RDW 15.4 08/20/2023 0511   LYMPHSABS 1.2 08/16/2023 1316   MONOABS 0.6 08/07/2023 1316   EOSABS 0.2 07/28/2023 1316   BASOSABS 0.0 08/22/2023 1316    BMET    Component Value Date/Time   NA 134 (L) 08/24/2023 0459   K 3.6 08/24/2023 0459   CL 101 08/24/2023 0459   CO2 25 08/24/2023 0459   GLUCOSE 124 (H) 08/24/2023 0459   BUN 38 (H) 08/24/2023 0459   CREATININE 3.07 (H) 08/24/2023 0459   CALCIUM 7.9 (L) 08/24/2023 0459   GFRNONAA 22 (L) 08/24/2023 0459   GFRAA >60 02/15/2019 0455    INR    Component Value Date/Time   INR 1.0 02/03/2022 1947     Intake/Output Summary (Last 24 hours) at 08/24/2023 1030 Last data filed at 08/24/2023 0700 Gross per 24 hour  Intake 558.11 ml  Output 1232.9 ml  Net -674.79 ml      Assessment/Plan:  67 y.o. male with ischemic left BKA and right TMA wounds  DNR/DNI, except for interventions will prolong his life. I agree with Dr. Chestine Spore that with his poor inflow, even an above-knee amputation will have trouble healing. I am happy that he is having palliative care discussions.  Plan for left-sided above-knee amputation on Monday.  Should patient change his mind, we will cancel the case. At this point, the amputation is palliative for pain control  Victorino Sparrow [facility] Office: [phone]

## 2023-08-24 NOTE — Progress Notes (Signed)
Nephrology Follow-Up Consult note   Assessment/Recommendations: Antonio Mane. is a/an 67 y.o. male with a past medical history significant for CKD 3b, PAD, ischemic cardiomyopathy, admitted for PEA arrest c/b ARF, NSTEMI, and ongoing PAD.       AKI on CKD 3b: Likely ischemic ATN in the setting of cardiac arrest.  On renal replacement therapy since 9/22.  Still considered AKI.  No appreciable urine output.  Was on CRRT 9/22-27.  Hemodynamics have improved and now we will try to transition to intermittent hemodialysis -Plan for 2 hours of hemodialysis today -Reassess needs for dialysis on Monday or Tuesday -Likely consult IR tomorrow for tunneled dialysis catheter -Continue to monitor daily Cr, Dose meds for GFR -Monitor Daily I/Os, Daily weight  -Maintain MAP>65 for optimal renal perfusion.  -Avoid nephrotoxic medications including NSAIDs -Use synthetic opioids (Fentanyl/Dilaudid) if needed  Volume overload: Has improved with renal replacement therapy  Biventricular heart failure: Ischemic in nature.  Cardiology following.  Hemodynamics have improved  NSTEMI: Most of the occlusions are chronic in nature.  Followed by cardiology  Ischemic left BKA stump: Followed by IV VS.  Ongoing pain control  Goals of care: DNR but wants to pursue dialysis after discussions   Recommendations conveyed to primary service.    Darnell Level [facility] 08/24/2023 8:48 AM  ___________________________________________________________  CC: PEA arrest  Interval History/Subjective: Some agitation and then hypotension and bradycardia with Precedex overnight.  States he feels well this morning.  Denies significant pain.  Came off CRRT yesterday around 3 PM.   Medications:  Current Facility-Administered Medications  Medication Dose Route Frequency Provider Last Rate Last Admin   0.9 %  sodium chloride infusion  250 mL Intravenous PRN Corky Crafts, MD   Stopped at  08/23/23 0700   0.9 %  sodium chloride infusion   Intravenous Continuous Lorine Bears A, MD 0 mL/hr at 08/23/23 1800 Rate Verify at 08/23/23 1800   0.9 %  sodium chloride infusion  250 mL Intravenous PRN Iran Ouch, MD   Stopped at 08/23/23 0700   acetaminophen (TYLENOL) tablet 650 mg  650 mg Oral Q6H WA Lorine Bears A, MD   650 mg at 08/23/23 1322   acetaminophen (TYLENOL) tablet 650 mg  650 mg Oral Q4H PRN [place] Ouch, MD       arformoterol (BROVANA) nebulizer solution 15 mcg  15 mcg Nebulization BID Lorin Glass, MD   15 mcg at 08/24/23 0720   ascorbic acid (VITAMIN C) tablet 250 mg  250 mg Oral BID Lorine Bears A, MD   250 mg at 08/24/23 0012   aspirin chewable tablet 81 mg  81 mg Oral Daily Lorine Bears A, MD   81 mg at 08/23/23 0910   atorvastatin (LIPITOR) tablet 40 mg  40 mg Oral Daily Lorine Bears A, MD   40 mg at 08/23/23 0908   budesonide (PULMICORT) nebulizer solution 0.25 mg  0.25 mg Nebulization BID Lorin Glass, MD   0.25 mg at 08/24/23 0720   Chlorhexidine Gluconate Cloth 2 % PADS 6 each  6 each Topical Daily Lorin Glass, MD       Chlorhexidine Gluconate Cloth 2 % PADS 6 each  6 each Topical Q0600 Darnell Level, MD       docusate (COLACE) 50 MG/5ML liquid 100 mg  100 mg Per Tube BID PRN Corky Crafts, MD       famotidine (PEPCID) tablet 20 mg  20  mg Oral Daily Lorine Bears A, MD   20 mg at 08/23/23 0909   feeding supplement (ENSURE ENLIVE / ENSURE PLUS) liquid 237 mL  237 mL Oral TID BM Lorine Bears A, MD   237 mL at 08/22/23 2143   gabapentin (NEURONTIN) capsule 200 mg  200 mg Oral BID Lanier Clam, NP   200 mg at 08/24/23 0013   heparin injection 5,000 Units  5,000 Units Subcutaneous Q8H Lorine Bears A, MD   5,000 Units at 08/24/23 0020   hydrALAZINE (APRESOLINE) injection 10 mg  10 mg Intravenous Q6H PRN Iran Ouch, MD   10 mg at 08/01/2023 0147   HYDROmorphone (DILAUDID) injection 0.5-1 mg  0.5-1 mg Intravenous  Q3H PRN Lanier Clam, NP   1 mg at 08/23/23 1804   insulin aspart (novoLOG) injection 0-9 Units  0-9 Units Subcutaneous Q4H Iran Ouch, MD   1 Units at 08/24/23 0500   insulin glargine-yfgn (SEMGLEE) injection 5 Units  5 Units Subcutaneous Daily Lorin Glass, MD   5 Units at 08/23/23 4098   methocarbamol (ROBAXIN) tablet 500 mg  500 mg Oral TID Iran Ouch, MD   500 mg at 08/24/23 0013   multivitamin (RENA-VIT) tablet 1 tablet  1 tablet Oral BID Iran Ouch, MD   1 tablet at 08/23/23 0814   OLANZapine (ZYPREXA) injection 2.5 mg  2.5 mg Intramuscular Q6H PRN Migdalia Dk, MD       ondansetron (ZOFRAN) injection 4 mg  4 mg Intravenous Q6H PRN Corky Crafts, MD       Oral care mouth rinse  15 mL Mouth Rinse PRN Lorin Glass, MD       Oral care mouth rinse  15 mL Mouth Rinse TID Lorine Bears A, MD   15 mL at 08/24/23 0021   oxyCODONE (Oxy IR/ROXICODONE) immediate release tablet 10 mg  10 mg Oral Q4H PRN Iran Ouch, MD   10 mg at 08/24/23 0013   polyethylene glycol (MIRALAX / GLYCOLAX) packet 17 g  17 g Per Tube Daily PRN Corky Crafts, MD       revefenacin [facility]) nebulizer solution 175 mcg  175 mcg Nebulization Daily Lorin Glass, MD   175 mcg at 08/24/23 0720   sodium chloride flush (NS) 0.9 % injection 3 mL  3 mL Intravenous Q12H Corky Crafts, MD   3 mL at 08/24/23 0023   sodium chloride flush (NS) 0.9 % injection 3 mL  3 mL Intravenous PRN Corky Crafts, MD   10 mL at 08/24/2023 1052   sodium chloride flush (NS) 0.9 % injection 3 mL  3 mL Intravenous Q12H Lorine Bears A, MD   3 mL at 08/23/23 0916   sodium chloride flush (NS) 0.9 % injection 3 mL  3 mL Intravenous PRN Iran Ouch, MD          Review of Systems: 10 systems reviewed and negative except per interval history/subjective  Physical Exam: Vitals:   08/24/23 0730 08/24/23 0800  BP:  (!) 148/72  Pulse: 87 90  Resp: 15 14  Temp:    SpO2: 100% 96%    No intake/output data recorded.  Intake/Output Summary (Last 24 hours) at 08/24/2023 0848 Last data filed at 08/24/2023 0700 Gross per 24 hour  Intake 578.11 ml  Output 1629.7 ml  Net -1051.59 ml   Constitutional: well-appearing, no acute distress ENMT: ears and nose without scars or lesions, MMM CV:  normal rate, no edema Respiratory: Bilateral chest rise with no increased work of breathing Gastrointestinal: soft, nondistended Skin: no visible lesions or rashes Psych: Appropriate mood and affect   Test Results I personally reviewed new and old clinical labs and radiology tests Lab Results  Component Value Date   NA 134 (L) 08/24/2023   K 3.6 08/24/2023   CL 101 08/24/2023   CO2 25 08/24/2023   BUN 38 (H) 08/24/2023   CREATININE 3.07 (H) 08/24/2023   CALCIUM 7.9 (L) 08/24/2023   ALBUMIN 2.2 (L) 08/24/2023   PHOS 3.4 08/24/2023    CBC Recent Labs  Lab 08/18/23 0258 08/17/2023 0319 08/20/23 0511 08/20/23 1402 08/08/2023 1519 08/11/2023 1523  WBC 17.3* 11.8* 10.9*  --   --   --   HGB 12.1* 12.5* 12.1* 13.6 13.9 13.6  HCT 40.3 40.7 41.3 40.0 41.0 40.0  MCV 86.1 84.4 84.1  --   --   --   PLT 133* 113* 141*  --   --   --

## 2023-08-25 LAB — HEPATITIS B SURFACE ANTIBODY, QUANTITATIVE: Hep B S AB Quant (Post): <3.5 m[IU]/mL — ABNORMAL LOW

## 2023-08-26 SURGERY — AMPUTATION, ABOVE KNEE
Anesthesia: Regional | Site: Knee | Laterality: Left

## 2023-08-27 NOTE — Death Summary Note (Signed)
HAND  Result Value Ref Range Status   Specimen Description BLOOD RIGHT HAND  Final   Special Requests   Final    BOTTLES DRAWN AEROBIC AND ANAEROBIC Blood Culture adequate volume   Culture   Final    NO GROWTH 5 DAYS Performed at [facility], [address] [place], [place] [address]    Report Status 08/20/2023 FINAL  Final  Culture, blood (Routine X 2) w Reflex to ID Panel     Status: None   Collection Time: 08/06/2023  9:22 PM   Specimen: BLOOD  Result Value Ref Range Status   Specimen Description BLOOD SITE NOT SPECIFIED  Final   Special Requests   Final    BOTTLES DRAWN AEROBIC AND ANAEROBIC Blood Culture adequate volume   Culture   Final    NO GROWTH 5 DAYS Performed at [facility], [address] [place], [place] [address]    Report Status 08/20/2023 FINAL  Final  Urine Culture      Status: None   Collection Time: 08/16/23 10:12 AM   Specimen: Urine, Random  Result Value Ref Range Status   Specimen Description URINE, RANDOM  Final   Special Requests NONE Reflexed from [id number]  Final   Culture   Final    NO GROWTH Performed at [facility], [address] [place], [place] [address]    Report Status 08/18/2023 FINAL  Final    Lab Basic Metabolic Panel: Recent Labs  Lab 08/20/23 0511 08/20/23 0820 08/23/2023 0300 08/05/2023 0949 08/22/23 0343 08/22/23 0814 08/22/23 1555 08/22/23 2037 08/23/23 0428 08/23/23 1842 08/24/23 0459  NA 135   < > 134*   < > 132*  --  132*  --  133* 134* 134*  K 3.6   < > 3.8  3.9   < > 4.2  4.1   < > 3.9 3.9 4.0 3.6 3.6  CL 101   < > 102   < > 103  --  100  --  102 102 101  CO2 23   < > 21*   < > 23  --  24  --  25 25 25   GLUCOSE 145*   < > 108*   < > 189*  --  268*  --  131* 156* 124*  BUN 38*   < > 30*   < > 25*  --  24*  --  22 27* 38*  CREATININE 2.31*   < > 2.18*   < > 2.00*  --  2.18*  --  2.03* 2.37* 3.07*  CALCIUM 7.3*   < > 7.0*   < > 7.5*  --  7.4*  --  7.8* 7.7* 7.9*  MG 2.6*  --  2.5*  --  2.7*  --   --   --  2.6*  --  2.7*  PHOS 4.2   < > 4.3   < > 3.3  --  2.6  --  2.9 2.4* 3.4   < > = values in this interval not displayed.   Liver Function Tests: Recent Labs  Lab 08/22/23 0343 08/22/23 1555 08/23/23 0428 08/23/23 1842 08/24/23 0459  ALBUMIN 2.3* 2.3* 2.5* 2.3* 2.2*   No results for input(s): "LIPASE", "AMYLASE" in the last 168 hours. Recent Labs  Lab 08/18/23 1811  AMMONIA 28   CBC: Recent Labs  Lab Aug 23, 2023 0319 08/20/23 0511 08/20/23 1402 07/29/2023 1519 08/04/2023 1523  WBC 11.8* 10.9*  --   --   --  DEATH SUMMARY   Patient Details  Name: Antonio [name]. MRN: [id number] DOB: [birth year]  Admission/Discharge Information   Admit Date:  Sep 12, 2023  Date of Death: Date of Death: 09/22/23  Time of Death: Time of Death: 0835  Length of Stay: 2023/10/03  Referring Physician: Erskine Emery, NP   Reason(s) for Hospitalization  PEA cardiac arrest  Diagnoses  Preliminary cause of death: AKI  Secondary Diagnoses (including complications and co-morbidities):  Principal Problem:   Complete heart block [facility]) Active Problems:   Cardiac arrest (HCC)   Heart block AV complete (HCC)   Malnutrition of moderate degree   HFrEF (heart failure with reduced ejection fraction) (HCC)   PAH (pulmonary artery hypertension) (HCC)   Acute renal failure (HCC)   Goals of care, counseling/discussion   Pulmonary hypertension (HCC)   Lower limb ischemia   Acute on chronic HFrEF (heart failure with reduced ejection fraction) (HCC)   Hyperglycemia   Ischemic leg pain   Intractable pain Cardiogenic shock due to ischemic cardiomyopathy; biventricular heart failure Pulmonary hypertension NSTEMI Coronary artery disease-multivessel Critical limb ischemia History of left BKA History of right TMA Hyponatremia hypomagnesemia Acute kidney 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 is a 67 y.o. year old male who has PMH of HTN, CAD s/p stent placement, PAD s/p L BKA and LSFA stenting 10/11/12, T2DM, right foot osteomyelitis s/p right iliofemoral endarterectomy and right metatarsal amputation presents to [facility] ED on 09/12/2023 w/ UTI.   Patient having UTI symptoms for the past 4-5 days. Also having b/l back pain and abd pain. Also having chronic right leg pain that has been progressively  worsening over the last 4-5 months. On arrival patient alert and hypertensive 162/120. Afebrile and wbc 7.8. UA w/ leukocytes and uc pending. Started on rocephin. Patient also w/ creat 2.32. Patient taken to vascular lab and dvt US with no evidence of dvt and arterial duplex showing 75-99% stenosis in deep femoral artery and 50-74% stenosis in superficial femoral artery recommending vascular consult.    While in ED patient found became unresponsive w/ clinched teeth. Patient was pulseless and cpr started w/ rosc in 2 minutes. Attempted to intubated after code and very difficult airway and initially could not pass ETT. Patient coded during intubation procedure. 6.5 ETT was able to be placed and ROSC was achieved. Patient on levo and epi. Patient hr 50s. EKG showing complete AV block. Cards consulted and taking for emergent cath. PCCM consulted for ICU admission.  Initially during his hospitalization he required MV, but was able to be extubated to [place] on HD #3. He had EEGs that did not show signs of seizure and he was able to wake up and communicate. He had a stent placed in the LAD.   His cardiogenic shock was managed with inotropes, which eventually were able to be weaned off. His heart block improved and his TVP was able to be removed. Unfortunately he had recurrence of CHB suddenly a/w recurrent shock on 9/28. At that time dopamine was resumed and he seemed to spontaneously go back into his native sinus rhythm concurrently.   AKI eventually required CRRT to manage. He did not make it to a trial of iHD before worsening again. At that time family elected to pursue comfort care due to poor overall prognosis and  HAND  Result Value Ref Range Status   Specimen Description BLOOD RIGHT HAND  Final   Special Requests   Final    BOTTLES DRAWN AEROBIC AND ANAEROBIC Blood Culture adequate volume   Culture   Final    NO GROWTH 5 DAYS Performed at [facility], [address] [place], [place] [address]    Report Status 08/20/2023 FINAL  Final  Culture, blood (Routine X 2) w Reflex to ID Panel     Status: None   Collection Time: 08/06/2023  9:22 PM   Specimen: BLOOD  Result Value Ref Range Status   Specimen Description BLOOD SITE NOT SPECIFIED  Final   Special Requests   Final    BOTTLES DRAWN AEROBIC AND ANAEROBIC Blood Culture adequate volume   Culture   Final    NO GROWTH 5 DAYS Performed at [facility], [address] [place], [place] [address]    Report Status 08/20/2023 FINAL  Final  Urine Culture      Status: None   Collection Time: 08/16/23 10:12 AM   Specimen: Urine, Random  Result Value Ref Range Status   Specimen Description URINE, RANDOM  Final   Special Requests NONE Reflexed from [id number]  Final   Culture   Final    NO GROWTH Performed at [facility], [address] [place], [place] [address]    Report Status 08/18/2023 FINAL  Final    Lab Basic Metabolic Panel: Recent Labs  Lab 08/20/23 0511 08/20/23 0820 08/23/2023 0300 08/05/2023 0949 08/22/23 0343 08/22/23 0814 08/22/23 1555 08/22/23 2037 08/23/23 0428 08/23/23 1842 08/24/23 0459  NA 135   < > 134*   < > 132*  --  132*  --  133* 134* 134*  K 3.6   < > 3.8  3.9   < > 4.2  4.1   < > 3.9 3.9 4.0 3.6 3.6  CL 101   < > 102   < > 103  --  100  --  102 102 101  CO2 23   < > 21*   < > 23  --  24  --  25 25 25   GLUCOSE 145*   < > 108*   < > 189*  --  268*  --  131* 156* 124*  BUN 38*   < > 30*   < > 25*  --  24*  --  22 27* 38*  CREATININE 2.31*   < > 2.18*   < > 2.00*  --  2.18*  --  2.03* 2.37* 3.07*  CALCIUM 7.3*   < > 7.0*   < > 7.5*  --  7.4*  --  7.8* 7.7* 7.9*  MG 2.6*  --  2.5*  --  2.7*  --   --   --  2.6*  --  2.7*  PHOS 4.2   < > 4.3   < > 3.3  --  2.6  --  2.9 2.4* 3.4   < > = values in this interval not displayed.   Liver Function Tests: Recent Labs  Lab 08/22/23 0343 08/22/23 1555 08/23/23 0428 08/23/23 1842 08/24/23 0459  ALBUMIN 2.3* 2.3* 2.5* 2.3* 2.2*   No results for input(s): "LIPASE", "AMYLASE" in the last 168 hours. Recent Labs  Lab 08/18/23 1811  AMMONIA 28   CBC: Recent Labs  Lab Aug 23, 2023 0319 08/20/23 0511 08/20/23 1402 07/29/2023 1519 08/04/2023 1523  WBC 11.8* 10.9*  --   --   --  DEATH SUMMARY   Patient Details  Name: Antonio [name]. MRN: [id number] DOB: [birth year]  Admission/Discharge Information   Admit Date:  Sep 12, 2023  Date of Death: Date of Death: 09/22/23  Time of Death: Time of Death: 0835  Length of Stay: 2023/10/03  Referring Physician: Erskine Emery, NP   Reason(s) for Hospitalization  PEA cardiac arrest  Diagnoses  Preliminary cause of death: AKI  Secondary Diagnoses (including complications and co-morbidities):  Principal Problem:   Complete heart block [facility]) Active Problems:   Cardiac arrest (HCC)   Heart block AV complete (HCC)   Malnutrition of moderate degree   HFrEF (heart failure with reduced ejection fraction) (HCC)   PAH (pulmonary artery hypertension) (HCC)   Acute renal failure (HCC)   Goals of care, counseling/discussion   Pulmonary hypertension (HCC)   Lower limb ischemia   Acute on chronic HFrEF (heart failure with reduced ejection fraction) (HCC)   Hyperglycemia   Ischemic leg pain   Intractable pain Cardiogenic shock due to ischemic cardiomyopathy; biventricular heart failure Pulmonary hypertension NSTEMI Coronary artery disease-multivessel Critical limb ischemia History of left BKA History of right TMA Hyponatremia hypomagnesemia Acute kidney 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 is a 67 y.o. year old male who has PMH of HTN, CAD s/p stent placement, PAD s/p L BKA and LSFA stenting 10/11/12, T2DM, right foot osteomyelitis s/p right iliofemoral endarterectomy and right metatarsal amputation presents to [facility] ED on 09/12/2023 w/ UTI.   Patient having UTI symptoms for the past 4-5 days. Also having b/l back pain and abd pain. Also having chronic right leg pain that has been progressively  worsening over the last 4-5 months. On arrival patient alert and hypertensive 162/120. Afebrile and wbc 7.8. UA w/ leukocytes and uc pending. Started on rocephin. Patient also w/ creat 2.32. Patient taken to vascular lab and dvt US with no evidence of dvt and arterial duplex showing 75-99% stenosis in deep femoral artery and 50-74% stenosis in superficial femoral artery recommending vascular consult.    While in ED patient found became unresponsive w/ clinched teeth. Patient was pulseless and cpr started w/ rosc in 2 minutes. Attempted to intubated after code and very difficult airway and initially could not pass ETT. Patient coded during intubation procedure. 6.5 ETT was able to be placed and ROSC was achieved. Patient on levo and epi. Patient hr 50s. EKG showing complete AV block. Cards consulted and taking for emergent cath. PCCM consulted for ICU admission.  Initially during his hospitalization he required MV, but was able to be extubated to [place] on HD #3. He had EEGs that did not show signs of seizure and he was able to wake up and communicate. He had a stent placed in the LAD.   His cardiogenic shock was managed with inotropes, which eventually were able to be weaned off. His heart block improved and his TVP was able to be removed. Unfortunately he had recurrence of CHB suddenly a/w recurrent shock on 9/28. At that time dopamine was resumed and he seemed to spontaneously go back into his native sinus rhythm concurrently.   AKI eventually required CRRT to manage. He did not make it to a trial of iHD before worsening again. At that time family elected to pursue comfort care due to poor overall prognosis and  DEATH SUMMARY   Patient Details  Name: Antonio [name]. MRN: [id number] DOB: [birth year]  Admission/Discharge Information   Admit Date:  Sep 12, 2023  Date of Death: Date of Death: 09/22/23  Time of Death: Time of Death: 0835  Length of Stay: 2023/10/03  Referring Physician: Erskine Emery, NP   Reason(s) for Hospitalization  PEA cardiac arrest  Diagnoses  Preliminary cause of death: AKI  Secondary Diagnoses (including complications and co-morbidities):  Principal Problem:   Complete heart block [facility]) Active Problems:   Cardiac arrest (HCC)   Heart block AV complete (HCC)   Malnutrition of moderate degree   HFrEF (heart failure with reduced ejection fraction) (HCC)   PAH (pulmonary artery hypertension) (HCC)   Acute renal failure (HCC)   Goals of care, counseling/discussion   Pulmonary hypertension (HCC)   Lower limb ischemia   Acute on chronic HFrEF (heart failure with reduced ejection fraction) (HCC)   Hyperglycemia   Ischemic leg pain   Intractable pain Cardiogenic shock due to ischemic cardiomyopathy; biventricular heart failure Pulmonary hypertension NSTEMI Coronary artery disease-multivessel Critical limb ischemia History of left BKA History of right TMA Hyponatremia hypomagnesemia Acute kidney 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 is a 67 y.o. year old male who has PMH of HTN, CAD s/p stent placement, PAD s/p L BKA and LSFA stenting 10/11/12, T2DM, right foot osteomyelitis s/p right iliofemoral endarterectomy and right metatarsal amputation presents to [facility] ED on 09/12/2023 w/ UTI.   Patient having UTI symptoms for the past 4-5 days. Also having b/l back pain and abd pain. Also having chronic right leg pain that has been progressively  worsening over the last 4-5 months. On arrival patient alert and hypertensive 162/120. Afebrile and wbc 7.8. UA w/ leukocytes and uc pending. Started on rocephin. Patient also w/ creat 2.32. Patient taken to vascular lab and dvt US with no evidence of dvt and arterial duplex showing 75-99% stenosis in deep femoral artery and 50-74% stenosis in superficial femoral artery recommending vascular consult.    While in ED patient found became unresponsive w/ clinched teeth. Patient was pulseless and cpr started w/ rosc in 2 minutes. Attempted to intubated after code and very difficult airway and initially could not pass ETT. Patient coded during intubation procedure. 6.5 ETT was able to be placed and ROSC was achieved. Patient on levo and epi. Patient hr 50s. EKG showing complete AV block. Cards consulted and taking for emergent cath. PCCM consulted for ICU admission.  Initially during his hospitalization he required MV, but was able to be extubated to [place] on HD #3. He had EEGs that did not show signs of seizure and he was able to wake up and communicate. He had a stent placed in the LAD.   His cardiogenic shock was managed with inotropes, which eventually were able to be weaned off. His heart block improved and his TVP was able to be removed. Unfortunately he had recurrence of CHB suddenly a/w recurrent shock on 9/28. At that time dopamine was resumed and he seemed to spontaneously go back into his native sinus rhythm concurrently.   AKI eventually required CRRT to manage. He did not make it to a trial of iHD before worsening again. At that time family elected to pursue comfort care due to poor overall prognosis and  DEATH SUMMARY   Patient Details  Name: Antonio [name]. MRN: [id number] DOB: [birth year]  Admission/Discharge Information   Admit Date:  Sep 12, 2023  Date of Death: Date of Death: 09/22/23  Time of Death: Time of Death: 0835  Length of Stay: 2023/10/03  Referring Physician: Erskine Emery, NP   Reason(s) for Hospitalization  PEA cardiac arrest  Diagnoses  Preliminary cause of death: AKI  Secondary Diagnoses (including complications and co-morbidities):  Principal Problem:   Complete heart block [facility]) Active Problems:   Cardiac arrest (HCC)   Heart block AV complete (HCC)   Malnutrition of moderate degree   HFrEF (heart failure with reduced ejection fraction) (HCC)   PAH (pulmonary artery hypertension) (HCC)   Acute renal failure (HCC)   Goals of care, counseling/discussion   Pulmonary hypertension (HCC)   Lower limb ischemia   Acute on chronic HFrEF (heart failure with reduced ejection fraction) (HCC)   Hyperglycemia   Ischemic leg pain   Intractable pain Cardiogenic shock due to ischemic cardiomyopathy; biventricular heart failure Pulmonary hypertension NSTEMI Coronary artery disease-multivessel Critical limb ischemia History of left BKA History of right TMA Hyponatremia hypomagnesemia Acute kidney 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 is a 67 y.o. year old male who has PMH of HTN, CAD s/p stent placement, PAD s/p L BKA and LSFA stenting 10/11/12, T2DM, right foot osteomyelitis s/p right iliofemoral endarterectomy and right metatarsal amputation presents to [facility] ED on 09/12/2023 w/ UTI.   Patient having UTI symptoms for the past 4-5 days. Also having b/l back pain and abd pain. Also having chronic right leg pain that has been progressively  worsening over the last 4-5 months. On arrival patient alert and hypertensive 162/120. Afebrile and wbc 7.8. UA w/ leukocytes and uc pending. Started on rocephin. Patient also w/ creat 2.32. Patient taken to vascular lab and dvt US with no evidence of dvt and arterial duplex showing 75-99% stenosis in deep femoral artery and 50-74% stenosis in superficial femoral artery recommending vascular consult.    While in ED patient found became unresponsive w/ clinched teeth. Patient was pulseless and cpr started w/ rosc in 2 minutes. Attempted to intubated after code and very difficult airway and initially could not pass ETT. Patient coded during intubation procedure. 6.5 ETT was able to be placed and ROSC was achieved. Patient on levo and epi. Patient hr 50s. EKG showing complete AV block. Cards consulted and taking for emergent cath. PCCM consulted for ICU admission.  Initially during his hospitalization he required MV, but was able to be extubated to [place] on HD #3. He had EEGs that did not show signs of seizure and he was able to wake up and communicate. He had a stent placed in the LAD.   His cardiogenic shock was managed with inotropes, which eventually were able to be weaned off. His heart block improved and his TVP was able to be removed. Unfortunately he had recurrence of CHB suddenly a/w recurrent shock on 9/28. At that time dopamine was resumed and he seemed to spontaneously go back into his native sinus rhythm concurrently.   AKI eventually required CRRT to manage. He did not make it to a trial of iHD before worsening again. At that time family elected to pursue comfort care due to poor overall prognosis and  HAND  Result Value Ref Range Status   Specimen Description BLOOD RIGHT HAND  Final   Special Requests   Final    BOTTLES DRAWN AEROBIC AND ANAEROBIC Blood Culture adequate volume   Culture   Final    NO GROWTH 5 DAYS Performed at [facility], [address] [place], [place] [address]    Report Status 08/20/2023 FINAL  Final  Culture, blood (Routine X 2) w Reflex to ID Panel     Status: None   Collection Time: 08/06/2023  9:22 PM   Specimen: BLOOD  Result Value Ref Range Status   Specimen Description BLOOD SITE NOT SPECIFIED  Final   Special Requests   Final    BOTTLES DRAWN AEROBIC AND ANAEROBIC Blood Culture adequate volume   Culture   Final    NO GROWTH 5 DAYS Performed at [facility], [address] [place], [place] [address]    Report Status 08/20/2023 FINAL  Final  Urine Culture      Status: None   Collection Time: 08/16/23 10:12 AM   Specimen: Urine, Random  Result Value Ref Range Status   Specimen Description URINE, RANDOM  Final   Special Requests NONE Reflexed from [id number]  Final   Culture   Final    NO GROWTH Performed at [facility], [address] [place], [place] [address]    Report Status 08/18/2023 FINAL  Final    Lab Basic Metabolic Panel: Recent Labs  Lab 08/20/23 0511 08/20/23 0820 08/23/2023 0300 08/05/2023 0949 08/22/23 0343 08/22/23 0814 08/22/23 1555 08/22/23 2037 08/23/23 0428 08/23/23 1842 08/24/23 0459  NA 135   < > 134*   < > 132*  --  132*  --  133* 134* 134*  K 3.6   < > 3.8  3.9   < > 4.2  4.1   < > 3.9 3.9 4.0 3.6 3.6  CL 101   < > 102   < > 103  --  100  --  102 102 101  CO2 23   < > 21*   < > 23  --  24  --  25 25 25   GLUCOSE 145*   < > 108*   < > 189*  --  268*  --  131* 156* 124*  BUN 38*   < > 30*   < > 25*  --  24*  --  22 27* 38*  CREATININE 2.31*   < > 2.18*   < > 2.00*  --  2.18*  --  2.03* 2.37* 3.07*  CALCIUM 7.3*   < > 7.0*   < > 7.5*  --  7.4*  --  7.8* 7.7* 7.9*  MG 2.6*  --  2.5*  --  2.7*  --   --   --  2.6*  --  2.7*  PHOS 4.2   < > 4.3   < > 3.3  --  2.6  --  2.9 2.4* 3.4   < > = values in this interval not displayed.   Liver Function Tests: Recent Labs  Lab 08/22/23 0343 08/22/23 1555 08/23/23 0428 08/23/23 1842 08/24/23 0459  ALBUMIN 2.3* 2.3* 2.5* 2.3* 2.2*   No results for input(s): "LIPASE", "AMYLASE" in the last 168 hours. Recent Labs  Lab 08/18/23 1811  AMMONIA 28   CBC: Recent Labs  Lab Aug 23, 2023 0319 08/20/23 0511 08/20/23 1402 07/29/2023 1519 08/04/2023 1523  WBC 11.8* 10.9*  --   --   --  HAND  Result Value Ref Range Status   Specimen Description BLOOD RIGHT HAND  Final   Special Requests   Final    BOTTLES DRAWN AEROBIC AND ANAEROBIC Blood Culture adequate volume   Culture   Final    NO GROWTH 5 DAYS Performed at [facility], [address] [place], [place] [address]    Report Status 08/20/2023 FINAL  Final  Culture, blood (Routine X 2) w Reflex to ID Panel     Status: None   Collection Time: 08/06/2023  9:22 PM   Specimen: BLOOD  Result Value Ref Range Status   Specimen Description BLOOD SITE NOT SPECIFIED  Final   Special Requests   Final    BOTTLES DRAWN AEROBIC AND ANAEROBIC Blood Culture adequate volume   Culture   Final    NO GROWTH 5 DAYS Performed at [facility], [address] [place], [place] [address]    Report Status 08/20/2023 FINAL  Final  Urine Culture      Status: None   Collection Time: 08/16/23 10:12 AM   Specimen: Urine, Random  Result Value Ref Range Status   Specimen Description URINE, RANDOM  Final   Special Requests NONE Reflexed from [id number]  Final   Culture   Final    NO GROWTH Performed at [facility], [address] [place], [place] [address]    Report Status 08/18/2023 FINAL  Final    Lab Basic Metabolic Panel: Recent Labs  Lab 08/20/23 0511 08/20/23 0820 08/23/2023 0300 08/05/2023 0949 08/22/23 0343 08/22/23 0814 08/22/23 1555 08/22/23 2037 08/23/23 0428 08/23/23 1842 08/24/23 0459  NA 135   < > 134*   < > 132*  --  132*  --  133* 134* 134*  K 3.6   < > 3.8  3.9   < > 4.2  4.1   < > 3.9 3.9 4.0 3.6 3.6  CL 101   < > 102   < > 103  --  100  --  102 102 101  CO2 23   < > 21*   < > 23  --  24  --  25 25 25   GLUCOSE 145*   < > 108*   < > 189*  --  268*  --  131* 156* 124*  BUN 38*   < > 30*   < > 25*  --  24*  --  22 27* 38*  CREATININE 2.31*   < > 2.18*   < > 2.00*  --  2.18*  --  2.03* 2.37* 3.07*  CALCIUM 7.3*   < > 7.0*   < > 7.5*  --  7.4*  --  7.8* 7.7* 7.9*  MG 2.6*  --  2.5*  --  2.7*  --   --   --  2.6*  --  2.7*  PHOS 4.2   < > 4.3   < > 3.3  --  2.6  --  2.9 2.4* 3.4   < > = values in this interval not displayed.   Liver Function Tests: Recent Labs  Lab 08/22/23 0343 08/22/23 1555 08/23/23 0428 08/23/23 1842 08/24/23 0459  ALBUMIN 2.3* 2.3* 2.5* 2.3* 2.2*   No results for input(s): "LIPASE", "AMYLASE" in the last 168 hours. Recent Labs  Lab 08/18/23 1811  AMMONIA 28   CBC: Recent Labs  Lab Aug 23, 2023 0319 08/20/23 0511 08/20/23 1402 07/29/2023 1519 08/04/2023 1523  WBC 11.8* 10.9*  --   --   --  DEATH SUMMARY   Patient Details  Name: Antonio [name]. MRN: [id number] DOB: [birth year]  Admission/Discharge Information   Admit Date:  Sep 12, 2023  Date of Death: Date of Death: 09/22/23  Time of Death: Time of Death: 0835  Length of Stay: 2023/10/03  Referring Physician: Erskine Emery, NP   Reason(s) for Hospitalization  PEA cardiac arrest  Diagnoses  Preliminary cause of death: AKI  Secondary Diagnoses (including complications and co-morbidities):  Principal Problem:   Complete heart block [facility]) Active Problems:   Cardiac arrest (HCC)   Heart block AV complete (HCC)   Malnutrition of moderate degree   HFrEF (heart failure with reduced ejection fraction) (HCC)   PAH (pulmonary artery hypertension) (HCC)   Acute renal failure (HCC)   Goals of care, counseling/discussion   Pulmonary hypertension (HCC)   Lower limb ischemia   Acute on chronic HFrEF (heart failure with reduced ejection fraction) (HCC)   Hyperglycemia   Ischemic leg pain   Intractable pain Cardiogenic shock due to ischemic cardiomyopathy; biventricular heart failure Pulmonary hypertension NSTEMI Coronary artery disease-multivessel Critical limb ischemia History of left BKA History of right TMA Hyponatremia hypomagnesemia Acute kidney 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 is a 67 y.o. year old male who has PMH of HTN, CAD s/p stent placement, PAD s/p L BKA and LSFA stenting 10/11/12, T2DM, right foot osteomyelitis s/p right iliofemoral endarterectomy and right metatarsal amputation presents to [facility] ED on 09/12/2023 w/ UTI.   Patient having UTI symptoms for the past 4-5 days. Also having b/l back pain and abd pain. Also having chronic right leg pain that has been progressively  worsening over the last 4-5 months. On arrival patient alert and hypertensive 162/120. Afebrile and wbc 7.8. UA w/ leukocytes and uc pending. Started on rocephin. Patient also w/ creat 2.32. Patient taken to vascular lab and dvt US with no evidence of dvt and arterial duplex showing 75-99% stenosis in deep femoral artery and 50-74% stenosis in superficial femoral artery recommending vascular consult.    While in ED patient found became unresponsive w/ clinched teeth. Patient was pulseless and cpr started w/ rosc in 2 minutes. Attempted to intubated after code and very difficult airway and initially could not pass ETT. Patient coded during intubation procedure. 6.5 ETT was able to be placed and ROSC was achieved. Patient on levo and epi. Patient hr 50s. EKG showing complete AV block. Cards consulted and taking for emergent cath. PCCM consulted for ICU admission.  Initially during his hospitalization he required MV, but was able to be extubated to [place] on HD #3. He had EEGs that did not show signs of seizure and he was able to wake up and communicate. He had a stent placed in the LAD.   His cardiogenic shock was managed with inotropes, which eventually were able to be weaned off. His heart block improved and his TVP was able to be removed. Unfortunately he had recurrence of CHB suddenly a/w recurrent shock on 9/28. At that time dopamine was resumed and he seemed to spontaneously go back into his native sinus rhythm concurrently.   AKI eventually required CRRT to manage. He did not make it to a trial of iHD before worsening again. At that time family elected to pursue comfort care due to poor overall prognosis and  DEATH SUMMARY   Patient Details  Name: Antonio [name]. MRN: [id number] DOB: [birth year]  Admission/Discharge Information   Admit Date:  Sep 12, 2023  Date of Death: Date of Death: 09/22/23  Time of Death: Time of Death: 0835  Length of Stay: 2023/10/03  Referring Physician: Erskine Emery, NP   Reason(s) for Hospitalization  PEA cardiac arrest  Diagnoses  Preliminary cause of death: AKI  Secondary Diagnoses (including complications and co-morbidities):  Principal Problem:   Complete heart block [facility]) Active Problems:   Cardiac arrest (HCC)   Heart block AV complete (HCC)   Malnutrition of moderate degree   HFrEF (heart failure with reduced ejection fraction) (HCC)   PAH (pulmonary artery hypertension) (HCC)   Acute renal failure (HCC)   Goals of care, counseling/discussion   Pulmonary hypertension (HCC)   Lower limb ischemia   Acute on chronic HFrEF (heart failure with reduced ejection fraction) (HCC)   Hyperglycemia   Ischemic leg pain   Intractable pain Cardiogenic shock due to ischemic cardiomyopathy; biventricular heart failure Pulmonary hypertension NSTEMI Coronary artery disease-multivessel Critical limb ischemia History of left BKA History of right TMA Hyponatremia hypomagnesemia Acute kidney 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 is a 67 y.o. year old male who has PMH of HTN, CAD s/p stent placement, PAD s/p L BKA and LSFA stenting 10/11/12, T2DM, right foot osteomyelitis s/p right iliofemoral endarterectomy and right metatarsal amputation presents to [facility] ED on 09/12/2023 w/ UTI.   Patient having UTI symptoms for the past 4-5 days. Also having b/l back pain and abd pain. Also having chronic right leg pain that has been progressively  worsening over the last 4-5 months. On arrival patient alert and hypertensive 162/120. Afebrile and wbc 7.8. UA w/ leukocytes and uc pending. Started on rocephin. Patient also w/ creat 2.32. Patient taken to vascular lab and dvt US with no evidence of dvt and arterial duplex showing 75-99% stenosis in deep femoral artery and 50-74% stenosis in superficial femoral artery recommending vascular consult.    While in ED patient found became unresponsive w/ clinched teeth. Patient was pulseless and cpr started w/ rosc in 2 minutes. Attempted to intubated after code and very difficult airway and initially could not pass ETT. Patient coded during intubation procedure. 6.5 ETT was able to be placed and ROSC was achieved. Patient on levo and epi. Patient hr 50s. EKG showing complete AV block. Cards consulted and taking for emergent cath. PCCM consulted for ICU admission.  Initially during his hospitalization he required MV, but was able to be extubated to [place] on HD #3. He had EEGs that did not show signs of seizure and he was able to wake up and communicate. He had a stent placed in the LAD.   His cardiogenic shock was managed with inotropes, which eventually were able to be weaned off. His heart block improved and his TVP was able to be removed. Unfortunately he had recurrence of CHB suddenly a/w recurrent shock on 9/28. At that time dopamine was resumed and he seemed to spontaneously go back into his native sinus rhythm concurrently.   AKI eventually required CRRT to manage. He did not make it to a trial of iHD before worsening again. At that time family elected to pursue comfort care due to poor overall prognosis and  DEATH SUMMARY   Patient Details  Name: Antonio [name]. MRN: [id number] DOB: [birth year]  Admission/Discharge Information   Admit Date:  Sep 12, 2023  Date of Death: Date of Death: 09/22/23  Time of Death: Time of Death: 0835  Length of Stay: 2023/10/03  Referring Physician: Erskine Emery, NP   Reason(s) for Hospitalization  PEA cardiac arrest  Diagnoses  Preliminary cause of death: AKI  Secondary Diagnoses (including complications and co-morbidities):  Principal Problem:   Complete heart block [facility]) Active Problems:   Cardiac arrest (HCC)   Heart block AV complete (HCC)   Malnutrition of moderate degree   HFrEF (heart failure with reduced ejection fraction) (HCC)   PAH (pulmonary artery hypertension) (HCC)   Acute renal failure (HCC)   Goals of care, counseling/discussion   Pulmonary hypertension (HCC)   Lower limb ischemia   Acute on chronic HFrEF (heart failure with reduced ejection fraction) (HCC)   Hyperglycemia   Ischemic leg pain   Intractable pain Cardiogenic shock due to ischemic cardiomyopathy; biventricular heart failure Pulmonary hypertension NSTEMI Coronary artery disease-multivessel Critical limb ischemia History of left BKA History of right TMA Hyponatremia hypomagnesemia Acute kidney 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 is a 67 y.o. year old male who has PMH of HTN, CAD s/p stent placement, PAD s/p L BKA and LSFA stenting 10/11/12, T2DM, right foot osteomyelitis s/p right iliofemoral endarterectomy and right metatarsal amputation presents to [facility] ED on 09/12/2023 w/ UTI.   Patient having UTI symptoms for the past 4-5 days. Also having b/l back pain and abd pain. Also having chronic right leg pain that has been progressively  worsening over the last 4-5 months. On arrival patient alert and hypertensive 162/120. Afebrile and wbc 7.8. UA w/ leukocytes and uc pending. Started on rocephin. Patient also w/ creat 2.32. Patient taken to vascular lab and dvt US with no evidence of dvt and arterial duplex showing 75-99% stenosis in deep femoral artery and 50-74% stenosis in superficial femoral artery recommending vascular consult.    While in ED patient found became unresponsive w/ clinched teeth. Patient was pulseless and cpr started w/ rosc in 2 minutes. Attempted to intubated after code and very difficult airway and initially could not pass ETT. Patient coded during intubation procedure. 6.5 ETT was able to be placed and ROSC was achieved. Patient on levo and epi. Patient hr 50s. EKG showing complete AV block. Cards consulted and taking for emergent cath. PCCM consulted for ICU admission.  Initially during his hospitalization he required MV, but was able to be extubated to [place] on HD #3. He had EEGs that did not show signs of seizure and he was able to wake up and communicate. He had a stent placed in the LAD.   His cardiogenic shock was managed with inotropes, which eventually were able to be weaned off. His heart block improved and his TVP was able to be removed. Unfortunately he had recurrence of CHB suddenly a/w recurrent shock on 9/28. At that time dopamine was resumed and he seemed to spontaneously go back into his native sinus rhythm concurrently.   AKI eventually required CRRT to manage. He did not make it to a trial of iHD before worsening again. At that time family elected to pursue comfort care due to poor overall prognosis and

## 2023-08-27 NOTE — Progress Notes (Signed)
Brief Nephrology Note  Patient and family have decided to focus predominately on comfort so we will sign off at this time. If further help is needed please let us know.

## 2023-08-27 NOTE — Progress Notes (Signed)
Patient went asystole and Nelly Laurence, RN and Benay Pillow, RN listened for heart tones for 2 min and pronounced time of death at [phone] 18-Sep-2023.  Daughter and son at bedside and MD made aware.

## 2023-08-27 DEATH — deceased

## 2023-09-18 ENCOUNTER — Ambulatory Visit: Payer: Medicare Other | Admitting: Student
